# Patient Record
Sex: Male | Born: 1960
Health system: Southern US, Community
[De-identification: ages and names within clinical notes are randomized; demographics above are authoritative.]

## PROBLEM LIST (undated history)

## (undated) DIAGNOSIS — E785 Hyperlipidemia, unspecified: Secondary | ICD-10-CM

## (undated) DIAGNOSIS — H269 Unspecified cataract: Secondary | ICD-10-CM

## (undated) DIAGNOSIS — I241 Dressler's syndrome: Secondary | ICD-10-CM

## (undated) DIAGNOSIS — I309 Acute pericarditis, unspecified: Secondary | ICD-10-CM

## (undated) DIAGNOSIS — H409 Unspecified glaucoma: Secondary | ICD-10-CM

## (undated) DIAGNOSIS — Z972 Presence of dental prosthetic device (complete) (partial): Secondary | ICD-10-CM

## (undated) DIAGNOSIS — I219 Acute myocardial infarction, unspecified: Secondary | ICD-10-CM

## (undated) DIAGNOSIS — R9431 Abnormal electrocardiogram [ECG] [EKG]: Secondary | ICD-10-CM

## (undated) DIAGNOSIS — E876 Hypokalemia: Secondary | ICD-10-CM

## (undated) DIAGNOSIS — I639 Cerebral infarction, unspecified: Secondary | ICD-10-CM

## (undated) DIAGNOSIS — Z720 Tobacco use: Secondary | ICD-10-CM

## (undated) DIAGNOSIS — H47019 Ischemic optic neuropathy, unspecified eye: Secondary | ICD-10-CM

## (undated) DIAGNOSIS — A0472 Enterocolitis due to Clostridium difficile, not specified as recurrent: Secondary | ICD-10-CM

## (undated) DIAGNOSIS — R7303 Prediabetes: Secondary | ICD-10-CM

## (undated) DIAGNOSIS — J9600 Acute respiratory failure, unspecified whether with hypoxia or hypercapnia: Secondary | ICD-10-CM

## (undated) DIAGNOSIS — I253 Aneurysm of heart: Secondary | ICD-10-CM

## (undated) DIAGNOSIS — I2510 Atherosclerotic heart disease of native coronary artery without angina pectoris: Secondary | ICD-10-CM

## (undated) DIAGNOSIS — I255 Ischemic cardiomyopathy: Secondary | ICD-10-CM

## (undated) DIAGNOSIS — I3139 Other pericardial effusion (noninflammatory): Secondary | ICD-10-CM

## (undated) DIAGNOSIS — I313 Pericardial effusion (noninflammatory): Secondary | ICD-10-CM

## (undated) DIAGNOSIS — E871 Hypo-osmolality and hyponatremia: Secondary | ICD-10-CM

## (undated) DIAGNOSIS — I314 Cardiac tamponade: Secondary | ICD-10-CM

## (undated) DIAGNOSIS — R001 Bradycardia, unspecified: Secondary | ICD-10-CM

## (undated) HISTORY — PX: CORONARY ANGIOPLASTY WITH STENT PLACEMENT: SHX49

## (undated) HISTORY — DX: Bradycardia, unspecified: R00.1

## (undated) HISTORY — DX: Ischemic optic neuropathy, unspecified eye: H47.019

## (undated) HISTORY — DX: Cerebral infarction, unspecified: I63.9

## (undated) HISTORY — DX: Unspecified glaucoma: H40.9

## (undated) HISTORY — DX: Other pericardial effusion (noninflammatory): I31.39

## (undated) HISTORY — DX: Hypokalemia: E87.6

## (undated) HISTORY — DX: Pericardial effusion (noninflammatory): I31.3

## (undated) HISTORY — DX: Unspecified cataract: H26.9

---

## 2004-07-08 ENCOUNTER — Emergency Department (HOSPITAL_COMMUNITY): Admission: EM | Admit: 2004-07-08 | Discharge: 2004-07-08 | Payer: Self-pay | Admitting: Family Medicine

## 2007-04-17 DIAGNOSIS — I639 Cerebral infarction, unspecified: Secondary | ICD-10-CM

## 2007-04-17 HISTORY — DX: Cerebral infarction, unspecified: I63.9

## 2008-03-09 ENCOUNTER — Emergency Department (HOSPITAL_COMMUNITY): Admission: EM | Admit: 2008-03-09 | Discharge: 2008-03-09 | Payer: Self-pay | Admitting: Emergency Medicine

## 2008-03-10 ENCOUNTER — Ambulatory Visit (HOSPITAL_COMMUNITY): Admission: RE | Admit: 2008-03-10 | Discharge: 2008-03-10 | Payer: Self-pay | Admitting: Emergency Medicine

## 2011-01-16 LAB — POCT CARDIAC MARKERS
CKMB, poc: 2.7
Myoglobin, poc: 120

## 2011-01-16 LAB — CBC
HCT: 46.3
Hemoglobin: 15.3
MCHC: 33.1
RBC: 5.43

## 2011-01-16 LAB — POCT I-STAT, CHEM 8
BUN: 8
Chloride: 106
Glucose, Bld: 97

## 2011-01-16 LAB — DIFFERENTIAL
Basophils Absolute: 0
Basophils Relative: 1
Monocytes Relative: 10
Neutrophils Relative %: 38 — ABNORMAL LOW

## 2013-01-05 ENCOUNTER — Inpatient Hospital Stay (HOSPITAL_COMMUNITY)
Admission: EM | Admit: 2013-01-05 | Discharge: 2013-01-07 | DRG: 281 | Disposition: A | Discharge status: Home / Self Care | Payer: Medicaid Other | Attending: Cardiology | Admitting: Cardiology

## 2013-01-05 ENCOUNTER — Encounter (HOSPITAL_COMMUNITY): Payer: Self-pay | Admitting: Emergency Medicine

## 2013-01-05 ENCOUNTER — Emergency Department (HOSPITAL_COMMUNITY): Payer: Medicaid Other

## 2013-01-05 DIAGNOSIS — I252 Old myocardial infarction: Secondary | ICD-10-CM

## 2013-01-05 DIAGNOSIS — Z7982 Long term (current) use of aspirin: Secondary | ICD-10-CM

## 2013-01-05 DIAGNOSIS — F17200 Nicotine dependence, unspecified, uncomplicated: Secondary | ICD-10-CM | POA: Diagnosis present

## 2013-01-05 DIAGNOSIS — T82897A Other specified complication of cardiac prosthetic devices, implants and grafts, initial encounter: Secondary | ICD-10-CM | POA: Diagnosis present

## 2013-01-05 DIAGNOSIS — Z79899 Other long term (current) drug therapy: Secondary | ICD-10-CM

## 2013-01-05 DIAGNOSIS — Z9861 Coronary angioplasty status: Secondary | ICD-10-CM

## 2013-01-05 DIAGNOSIS — E876 Hypokalemia: Secondary | ICD-10-CM

## 2013-01-05 DIAGNOSIS — I214 Non-ST elevation (NSTEMI) myocardial infarction: Principal | ICD-10-CM | POA: Diagnosis present

## 2013-01-05 DIAGNOSIS — I2510 Atherosclerotic heart disease of native coronary artery without angina pectoris: Secondary | ICD-10-CM | POA: Diagnosis present

## 2013-01-05 DIAGNOSIS — Y831 Surgical operation with implant of artificial internal device as the cause of abnormal reaction of the patient, or of later complication, without mention of misadventure at the time of the procedure: Secondary | ICD-10-CM | POA: Diagnosis present

## 2013-01-05 HISTORY — DX: Acute myocardial infarction, unspecified: I21.9

## 2013-01-05 HISTORY — DX: Atherosclerotic heart disease of native coronary artery without angina pectoris: I25.10

## 2013-01-05 LAB — CBC
HCT: 41 % (ref 39.0–52.0)
Hemoglobin: 15 g/dL (ref 13.0–17.0)
MCH: 30.2 pg (ref 26.0–34.0)
MCHC: 36.6 g/dL — ABNORMAL HIGH (ref 30.0–36.0)
MCV: 82.7 fL (ref 78.0–100.0)
Platelets: 194 10*3/uL (ref 150–400)
RBC: 4.96 MIL/uL (ref 4.22–5.81)
RDW: 12.8 % (ref 11.5–15.5)
WBC: 8.7 10*3/uL (ref 4.0–10.5)

## 2013-01-05 LAB — RAPID URINE DRUG SCREEN, HOSP PERFORMED
Amphetamines: NOT DETECTED
Barbiturates: NOT DETECTED
Benzodiazepines: NOT DETECTED
Cocaine: NOT DETECTED
Opiates: NOT DETECTED
Tetrahydrocannabinol: NOT DETECTED

## 2013-01-05 LAB — TROPONIN I
Troponin I: 5.69 ng/mL (ref <0.30)
Troponin I: 4.32 ng/mL (ref <0.30)

## 2013-01-05 LAB — BASIC METABOLIC PANEL
BUN: 13 mg/dL (ref 6–23)
CO2: 27 mEq/L (ref 19–32)
Calcium: 9 mg/dL (ref 8.4–10.5)
Chloride: 97 mEq/L (ref 96–112)
Creatinine, Ser: 0.96 mg/dL (ref 0.50–1.35)
GFR calc Af Amer: >90 mL/min (ref >90)
GFR calc non Af Amer: >90 mL/min (ref >90)
Glucose, Bld: 104 mg/dL — ABNORMAL HIGH (ref 70–99)
Potassium: 3.5 mEq/L (ref 3.5–5.1)
Sodium: 136 mEq/L (ref 135–145)

## 2013-01-05 LAB — POCT I-STAT TROPONIN I: Troponin i, poc: 6.78 ng/mL (ref 0.00–0.08)

## 2013-01-05 LAB — PROTIME-INR
INR: 1.26 (ref 0.00–1.49)
Prothrombin Time: 15.5 seconds — ABNORMAL HIGH (ref 11.6–15.2)

## 2013-01-05 LAB — MRSA PCR SCREENING: MRSA by PCR: NEGATIVE

## 2013-01-05 LAB — PRO B NATRIURETIC PEPTIDE: Pro B Natriuretic peptide (BNP): 359.9 pg/mL — ABNORMAL HIGH (ref 0–125)

## 2013-01-05 MED ORDER — NITROGLYCERIN IN D5W 200-5 MCG/ML-% IV SOLN: 5.0000 ug/min | INTRAVENOUS | Status: DC

## 2013-01-05 MED ORDER — ASPIRIN EC 81 MG PO TBEC
81.0000 mg | DELAYED_RELEASE_TABLET | Freq: Every day | ORAL | Status: DC
  Filled 2013-01-05: qty 1

## 2013-01-05 MED ORDER — ASPIRIN 81 MG PO CHEW
324.0000 mg | CHEWABLE_TABLET | ORAL | Status: AC
  Administered 2013-01-06: 324 mg via ORAL
  Filled 2013-01-05: qty 4

## 2013-01-05 MED ORDER — ASPIRIN EC 81 MG PO TBEC
81.0000 mg | DELAYED_RELEASE_TABLET | Freq: Every day | ORAL | Status: DC
  Administered 2013-01-07: 81 mg via ORAL
  Filled 2013-01-05 (×2): qty 1

## 2013-01-05 MED ORDER — SODIUM CHLORIDE 0.9 % IV SOLN
250.0000 mL | INTRAVENOUS | Status: DC | PRN
  Administered 2013-01-06: 250 mL via INTRAVENOUS

## 2013-01-05 MED ORDER — SODIUM CHLORIDE 0.9 % IJ SOLN: 3.0000 mL | INTRAMUSCULAR | Status: DC | PRN

## 2013-01-05 MED ORDER — ATORVASTATIN CALCIUM 80 MG PO TABS
80.0000 mg | ORAL_TABLET | Freq: Every day | ORAL | Status: DC
  Administered 2013-01-05 – 2013-01-06 (×2): 80 mg via ORAL
  Filled 2013-01-05 (×4): qty 1

## 2013-01-05 MED ORDER — ACETAMINOPHEN 325 MG PO TABS: 650.0000 mg | ORAL_TABLET | ORAL | Status: DC | PRN

## 2013-01-05 MED ORDER — HEPARIN (PORCINE) IN NACL 100-0.45 UNIT/ML-% IJ SOLN
1400.0000 [IU]/h | INTRAMUSCULAR | Status: DC
  Administered 2013-01-05: 1400 [IU]/h via INTRAVENOUS
  Filled 2013-01-05: qty 250

## 2013-01-05 MED ORDER — SODIUM CHLORIDE 0.9 % IV SOLN
1.0000 mL/kg/h | INTRAVENOUS | Status: DC
  Administered 2013-01-06: 1 mL/kg/h via INTRAVENOUS

## 2013-01-05 MED ORDER — ASPIRIN 81 MG PO CHEW
324.0000 mg | CHEWABLE_TABLET | Freq: Once | ORAL | Status: AC
  Administered 2013-01-05: 324 mg via ORAL
  Filled 2013-01-05: qty 4

## 2013-01-05 MED ORDER — ONDANSETRON HCL 4 MG/2ML IJ SOLN: 4.0000 mg | Freq: Four times a day (QID) | INTRAMUSCULAR | Status: DC | PRN

## 2013-01-05 MED ORDER — METOPROLOL TARTRATE 12.5 MG HALF TABLET
12.5000 mg | ORAL_TABLET | Freq: Two times a day (BID) | ORAL | Status: DC
  Administered 2013-01-05: 12.5 mg via ORAL
  Filled 2013-01-05 (×3): qty 1

## 2013-01-05 MED ORDER — NITROGLYCERIN 0.4 MG SL SUBL
0.4000 mg | SUBLINGUAL_TABLET | SUBLINGUAL | Status: DC | PRN
  Administered 2013-01-05 (×2): 0.4 mg via SUBLINGUAL

## 2013-01-05 MED ORDER — SODIUM CHLORIDE 0.9 % IJ SOLN
3.0000 mL | Freq: Two times a day (BID) | INTRAMUSCULAR | Status: DC
  Administered 2013-01-06: 3 mL via INTRAVENOUS

## 2013-01-05 MED ORDER — HEPARIN BOLUS VIA INFUSION
4000.0000 [IU] | Freq: Once | INTRAVENOUS | Status: AC
  Administered 2013-01-05: 4000 [IU] via INTRAVENOUS
  Filled 2013-01-05: qty 4000

## 2013-01-05 MED ORDER — POTASSIUM CHLORIDE CRYS ER 20 MEQ PO TBCR
20.0000 meq | EXTENDED_RELEASE_TABLET | Freq: Every day | ORAL | Status: DC
  Administered 2013-01-05 – 2013-01-07 (×2): 20 meq via ORAL
  Filled 2013-01-05 (×3): qty 1

## 2013-01-05 NOTE — Consult Note (Signed)
ANTICOAGULATION CONSULT NOTE - Initial Consult  Pharmacy Consult for Heparin Indication: chest pain/ACS  No Known Allergies  Patient Measurements: Height: 6\' 1"  (185.4 cm) Weight: 253 lb (114.76 kg) IBW/kg (Calculated) : 79.9 Heparin Dosing Weight: 104kg  Vital Signs: Temp: 98.9 F (37.2 C) (09/22 1823) Temp src: Oral (09/22 1823) BP: 102/71 mmHg (09/22 1823) Pulse Rate: 90 (09/22 1823)  Labs:  Recent Labs  01/05/13 1847  HGB 15.0  HCT 41.0  PLT 194    Estimated Creatinine Clearance: 96.7 ml/min (by C-G formula based on Cr of 1.2).   Medical History: Past Medical History  Diagnosis Date  . Coronary artery disease     Medications:  No anticoagulants pta  Assessment: 52yom with history of CAD presents to the ED with chest pain. Initial troponin is 6.78. He will begin IV heparin. Baseline CBC is ok, BMET pending.  Goal of Therapy:  Heparin level 0.3-0.7 units/ml Monitor platelets by anticoagulation protocol: Yes   Plan:  1) Heparin bolus 4000 units x 1 2) Heparin drip at 1400 units/hr 3) 6 hour heparin level 4) Daily heparin level and CBC  Fredrik Rigger 01/05/2013,7:18 PM

## 2013-01-05 NOTE — H&P (Signed)
[name] is an 52 y.o. male.   PCP: None Primary cardiologist: None Chief Complaint: Chest pain x4 days HPI:  This pleasant 52 year old gentleman comes to the emergency room because of ongoing chest pain.  The patient has a history of known ischemic heart disease.  Approximately 4 or 5 years ago while in [place] the patient had chest pain and a myocardial infarction and was treated with 2 stents.  Since that time he has been doing well.  He has been on no medication except for a 325 mg aspirin daily.  He was in his usual state of health on Friday, December 19 when he began having substernal chest discomfort.  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  The patient was in [place] when the chest pain began.  The chest pain has persisted over the past 4 days.  After he arrived in [place] he flew back home and then came to the emergency room.  In the emergency room his EKG is nonacute but did show evidence of the old inferoposterior wall myocardial infarction and his troponin level is significantly elevated at 6.78.  The patient has had nausea and vomiting once on September 20, none since.  He denies any history of peptic ulcer or GI bleed.  The patient does not consume alcohol.  He does not use illicit drugs.  He does smoke one pack of cigarettes a day.  His cholesterol status is unknown.   Past Medical History  Diagnosis Date  . Coronary artery disease     Past Surgical History  Procedure Laterality Date  . Coronary stent placement      No family history on file. Social History:  reports that he has been smoking.  He does not have any smokeless tobacco history on file. He reports that he does not drink alcohol or use illicit drugs.  Allergies: No Known Allergies   (Not in a hospital admission)  Results for orders placed during the hospital encounter of 01/05/13 (from the past 48 hour(s))  CBC      Status: Abnormal   Collection Time    01/05/13  6:47 PM      Result Value Range   WBC 8.7  4.0 - 10.5 K/uL   RBC 4.96  4.22 - 5.81 MIL/uL   Hemoglobin 15.0  13.0 - 17.0 g/dL   HCT 81.1  91.4 - 78.2 %   MCV 82.7  78.0 - 100.0 fL   MCH 30.2  26.0 - 34.0 pg   MCHC 36.6 (*) 30.0 - 36.0 g/dL   RDW 95.6  21.3 - 08.6 %   Platelets 194  150 - 400 K/uL  BASIC METABOLIC PANEL     Status: Abnormal   Collection Time    01/05/13  6:47 PM      Result Value Range   Sodium 136  135 - 145 mEq/L   Potassium 3.5  3.5 - 5.1 mEq/L   Chloride 97  96 - 112 mEq/L   CO2 27  19 - 32 mEq/L   Glucose, Bld 104 (*) 70 - 99 mg/dL   BUN 13  6 - 23 mg/dL   Creatinine, Ser 5.78  0.50 - 1.35 mg/dL   Calcium 9.0  8.4 - 46.9 mg/dL   GFR calc non Af Amer >90  >90 mL/min   GFR calc Af Amer >90  >90 mL/min   Comment: (NOTE)  The eGFR has been calculated using the CKD EPI equation.     This calculation has not been validated in all clinical situations.     eGFR's persistently <90 mL/min signify possible Chronic Kidney     Disease.  PRO B NATRIURETIC PEPTIDE     Status: Abnormal   Collection Time    01/05/13  6:47 PM      Result Value Range   Pro B Natriuretic peptide (BNP) 359.9 (*) 0 - 125 pg/mL  POCT I-STAT TROPONIN I     Status: Abnormal   Collection Time    01/05/13  6:56 PM      Result Value Range   Troponin i, poc 6.78 (*) 0.00 - 0.08 ng/mL   Comment NOTIFIED PHYSICIAN     Comment 3            Comment: Due to the release kinetics of cTnI,     a negative result within the first hours     of the onset of symptoms does not rule out     myocardial infarction with certainty.     If myocardial infarction is still suspected,     repeat the test at appropriate intervals.   No results found.  Review of systems reveals no history of hematochezia or melena.  All other systems negative except as noted in present illness.  Blood pressure 111/71, pulse 90, temperature 98.9 F (37.2 C), temperature source  Oral, resp. rate 19, height 6\' 1"  (1.854 m), weight 253 lb (114.76 kg), SpO2 100.00%. The patient appears to be in no distress.  Head and neck exam reveals that the pupils are equal and reactive.  The extraocular movements are full.  There is no scleral icterus.  Mouth and pharynx are benign.  No lymphadenopathy.  No carotid bruits.  The jugular venous pressure is normal.  Thyroid is not enlarged or tender.  Chest is clear to percussion and auscultation.  No rales or rhonchi.  Expansion of the chest is symmetrical.  Heart reveals no abnormal lift or heave.  First and second heart sounds are normal.  There is no murmur gallop rub or click.  The abdomen is soft and nontender.  Bowel sounds are normoactive.  There is no hepatosplenomegaly or mass.  There are no abdominal bruits.  Extremities reveal no phlebitis or edema.  Pedal pulses are good.  There is no cyanosis or clubbing.  Neurologic exam is normal strength and no lateralizing weakness.  No sensory deficits.  Integument reveals no rash  EKG on 01/03/13 1822 in sinus rhythm with Q waves in the inferior leads there are plan thin R waves in V1 system with true posterior and no significant ST-T wave changes.  There is slight coving of the ST segments in movements 3 and aVF consistent with prior myocardial infarction.  Assessment/Plan 1. non-STEMI 2. ischemic heart disease status post prior stents 4 or 5 years ago in [place].  Details of that procedure not currently available. 3. tobacco abuse, ongoing. 4. borderline hypokalemia  Plan: Admit to step down unit.  Continue aspirin, begin IV heparin and IV nitroglycerin.  Add  beta blocker and statin.  Add Kdur. We will plan for cardiac catheterization and possible angioplasty for tomorrow morning.  Serial cardiac enzymes and EKGs. Not clinically in congestive heart failure although BNP is mildly elevated.  Chest x-ray is pending.  Cassell Clement 01/05/2013, 8:09 PM

## 2013-01-05 NOTE — ED Notes (Signed)
Pt. reports left chest pain with nausea and vomitting , SOB and occasional dry cough onset last Friday , pt. stated history of CAD with coronary stent his cardiologist is at [place].

## 2013-01-06 ENCOUNTER — Encounter (HOSPITAL_COMMUNITY)
Admission: EM | Disposition: A | Discharge status: Home / Self Care | Payer: 59 | Source: Home / Self Care | Attending: Cardiology

## 2013-01-06 ENCOUNTER — Encounter (HOSPITAL_COMMUNITY): Payer: Self-pay | Admitting: *Deleted

## 2013-01-06 DIAGNOSIS — I214 Non-ST elevation (NSTEMI) myocardial infarction: Secondary | ICD-10-CM

## 2013-01-06 DIAGNOSIS — I2510 Atherosclerotic heart disease of native coronary artery without angina pectoris: Secondary | ICD-10-CM

## 2013-01-06 DIAGNOSIS — I517 Cardiomegaly: Secondary | ICD-10-CM

## 2013-01-06 HISTORY — PX: CARDIAC CATHETERIZATION: SHX172

## 2013-01-06 HISTORY — PX: LEFT HEART CATHETERIZATION WITH CORONARY ANGIOGRAM: SHX5451

## 2013-01-06 LAB — CBC
MCH: 29.5 pg (ref 26.0–34.0)
MCV: 82.6 fL (ref 78.0–100.0)
Platelets: 195 10*3/uL (ref 150–400)
RBC: 4.38 MIL/uL (ref 4.22–5.81)
RDW: 12.8 % (ref 11.5–15.5)
WBC: 7.3 10*3/uL (ref 4.0–10.5)

## 2013-01-06 LAB — HEPARIN LEVEL (UNFRACTIONATED)
Heparin Unfractionated: 1.09 IU/mL — ABNORMAL HIGH (ref 0.30–0.70)
Heparin Unfractionated: 0.2 IU/mL — ABNORMAL LOW (ref 0.30–0.70)

## 2013-01-06 LAB — COMPREHENSIVE METABOLIC PANEL
ALT: 23 U/L (ref 0–53)
Alkaline Phosphatase: 83 U/L (ref 39–117)
BUN: 13 mg/dL (ref 6–23)
CO2: 25 mEq/L (ref 19–32)
GFR calc Af Amer: >90 mL/min (ref >90)
GFR calc non Af Amer: >90 mL/min (ref >90)
Glucose, Bld: 95 mg/dL (ref 70–99)
Potassium: 3.4 mEq/L — ABNORMAL LOW (ref 3.5–5.1)
Sodium: 140 mEq/L (ref 135–145)
Total Bilirubin: 0.5 mg/dL (ref 0.3–1.2)
Total Protein: 6.7 g/dL (ref 6.0–8.3)

## 2013-01-06 LAB — LIPID PANEL
Cholesterol: 164 mg/dL (ref 0–200)
HDL: 44 mg/dL (ref >39)
LDL Cholesterol: 98 mg/dL (ref 0–99)
Total CHOL/HDL Ratio: 3.7 RATIO
Triglycerides: 109 mg/dL (ref <150)
VLDL: 22 mg/dL (ref 0–40)

## 2013-01-06 LAB — TSH: TSH: 0.604 u[IU]/mL (ref 0.350–4.500)

## 2013-01-06 LAB — TROPONIN I: Troponin I: 4.17 ng/mL (ref <0.30)

## 2013-01-06 SURGERY — LEFT HEART CATHETERIZATION WITH CORONARY ANGIOGRAM
Anesthesia: LOCAL

## 2013-01-06 MED ORDER — ADENOSINE 12 MG/4ML IV SOLN
16.0000 mL | Freq: Once | INTRAVENOUS | Status: DC
  Filled 2013-01-06: qty 16

## 2013-01-06 MED ORDER — LIDOCAINE HCL (PF) 1 % IJ SOLN
INTRAMUSCULAR | Status: AC
  Filled 2013-01-06: qty 30

## 2013-01-06 MED ORDER — HEPARIN SODIUM (PORCINE) 1000 UNIT/ML IJ SOLN
INTRAMUSCULAR | Status: AC
  Filled 2013-01-06: qty 1

## 2013-01-06 MED ORDER — MIDAZOLAM HCL 2 MG/2ML IJ SOLN
INTRAMUSCULAR | Status: AC
  Filled 2013-01-06: qty 2

## 2013-01-06 MED ORDER — CLOPIDOGREL BISULFATE 75 MG PO TABS
75.0000 mg | ORAL_TABLET | Freq: Every day | ORAL | Status: DC
  Administered 2013-01-07: 75 mg via ORAL
  Filled 2013-01-06: qty 1

## 2013-01-06 MED ORDER — FENTANYL CITRATE 0.05 MG/ML IJ SOLN
INTRAMUSCULAR | Status: AC
  Filled 2013-01-06: qty 2

## 2013-01-06 MED ORDER — NITROGLYCERIN 0.2 MG/ML ON CALL CATH LAB
INTRAVENOUS | Status: AC
  Filled 2013-01-06: qty 1

## 2013-01-06 MED ORDER — HEPARIN SODIUM (PORCINE) 5000 UNIT/ML IJ SOLN
5000.0000 [IU] | Freq: Three times a day (TID) | INTRAMUSCULAR | Status: DC
  Administered 2013-01-06 – 2013-01-07 (×2): 5000 [IU] via SUBCUTANEOUS
  Filled 2013-01-06 (×6): qty 1

## 2013-01-06 MED ORDER — VERAPAMIL HCL 2.5 MG/ML IV SOLN
INTRAVENOUS | Status: AC
  Filled 2013-01-06: qty 2

## 2013-01-06 MED ORDER — HEPARIN (PORCINE) IN NACL 2-0.9 UNIT/ML-% IJ SOLN
INTRAMUSCULAR | Status: AC
  Filled 2013-01-06: qty 1500

## 2013-01-06 MED ORDER — METOPROLOL TARTRATE 25 MG PO TABS
25.0000 mg | ORAL_TABLET | Freq: Two times a day (BID) | ORAL | Status: DC
  Administered 2013-01-06 – 2013-01-07 (×2): 25 mg via ORAL
  Filled 2013-01-06 (×3): qty 1

## 2013-01-06 MED ORDER — POTASSIUM CHLORIDE CRYS ER 20 MEQ PO TBCR: 20.0000 meq | EXTENDED_RELEASE_TABLET | Freq: Once | ORAL | Status: DC

## 2013-01-06 MED ORDER — SODIUM CHLORIDE 0.9 % IV SOLN: 1.0000 mL/kg/h | INTRAVENOUS | Status: AC

## 2013-01-06 NOTE — Interval H&P Note (Signed)
History and Physical Interval Note:  01/06/2013 7:51 AM  [name]  has presented today for surgery, with the diagnosis of cp  The various methods of treatment have been discussed with the patient and family. After consideration of risks, benefits and other options for treatment, the patient has consented to  Procedure(s): LEFT HEART CATHETERIZATION WITH CORONARY ANGIOGRAM (N/A) as a surgical intervention .  The patient's history has been reviewed, patient examined, no change in status, stable for surgery.  I have reviewed the patient's chart and labs.  Questions were answered to the patient's satisfaction.   Cath Lab Visit (complete for each Cath Lab visit)  Clinical Evaluation Leading to the Procedure:   ACS: yes  Non-ACS:    Anginal Classification: CCS III  Anti-ischemic medical therapy: No Therapy  Non-Invasive Test Results: No non-invasive testing performed  Prior CABG: No previous CABG        [name] 01/06/2013 7:51 AM

## 2013-01-06 NOTE — CV Procedure (Signed)
   Cardiac Catheterization Procedure Note  Name: [name] MRN: [id number] DOB: 09/29/1960  Procedure: Left Heart Cath, Selective Coronary Angiography, LV angiography, FFR analysis of the left circumflex artery.  Indication: 52 yo with history of coronary disease status post stenting of the proximal LAD and proximal left circumflex arteries 5 years ago in [place]. He presents now with a non-ST elevation myocardial infarction. He has a history of tobacco abuse.   Procedural Details: The right wrist was prepped, draped, and anesthetized with 1% lidocaine. Using the modified Seldinger technique, a 5 French sheath was introduced into the right radial artery. 3 mg of verapamil was administered through the sheath, weight-based unfractionated heparin was administered intravenously. Standard Judkins catheters were used for selective coronary angiography and left ventriculography. Catheter exchanges were performed over an exchange length guidewire. There were no immediate procedural complications. A TR band was used for radial hemostasis at the completion of the procedure.  The patient was transferred to the post catheterization recovery area for further monitoring.  Procedural Findings: Hemodynamics: AO 98/61 with a mean of 78 mmHg LV 98/18 mmHg  Coronary angiography: Coronary dominance: right  Left mainstem: Normal.  Left anterior descending (LAD): The LAD is a large vessel extending to the apex. There is a stent noted in the proximal vessel that is widely patent. There is no significant disease in the LAD. The first diagonal is moderate in size and appears normal. The second diagonal is very small and normal.  Left circumflex (LCx): The left circumflex gives rise to 2 marginal branches. In the proximal vessel there is a stented segment. There is diffuse in-stent disease up to 50-70%.  Right coronary artery (RCA): The RCA is a large dominant vessel. It is occluded in the mid vessel. There  are left to right collaterals to the distal RCA branches.  Left ventriculography: Left ventricular systolic function is abnormal, LVEF is estimated at 40-45%, there is severe hypokinesis to akinesis of the basal to mid inferior wall, there is no significant mitral regurgitation.  Given the uncertainty of the significance of the left circumflex disease we proceeded with flow wire analysis of this vessel. The patient was heparinized. Once a therapeutic ACT was obtained a 6 [place] XB LAD 3.5 guide was used. A pro-water guidewire was placed down the left circumflex. Using a flow wire we performed pressure flow wire analysis under maximal hyperemia with IV adenosine. This revealed a fractional flow reserve of 0.84.   Final Conclusions:   1. Single vessel occlusive coronary disease. The right coronary is occluded in the mid vessel with left to right collaterals. This appears to be his culprit vessel. 2. Patent stent in the proximal LAD. 3. Stent in the proximal left circumflex with moderate diffuse disease. Fractional flow reserve of 0.84 suggests that this is not hemodynamically significant. 4. Moderate left ventricular dysfunction.  Recommendations: Recommend medical management. Given his recent infarct I would recommend the addition of Plavix. We'll titrate up beta blocker as tolerated. If  blood pressure will tolerate,consider addition of an ACE inhibitor and nitrate. If patient continues to have refractory angina despite optimal medical therapy PCI of the RCA could be considered.  Theron Arista [facility] 01/06/2013, 8:59 AM

## 2013-01-06 NOTE — Progress Notes (Signed)
Subjective:  Patient doing well post-cath. Did not require PCI. Has occlusion of his mid-RCA with left-to-right collaterals. Medical therapy. Patient is not having any further chest pain. BP remains soft so cannot add any ACEi yet. Plavix has been added to his regimen. Telemetry stable NSR.   Objective:  Vital Signs in the last 24 hours: Temp:  [98.3 F (36.8 C)-99.5 F (37.5 C)] 98.3 F (36.8 C) (09/23 1130) Pulse Rate:  [71-95] 75 (09/23 1130) Resp:  [14-34] 18 (09/23 1130) BP: (93-132)/(64-83) 99/75 mmHg (09/23 1130) SpO2:  [92 %-100 %] 94 % (09/23 1130) Weight:  [234 lb 5.6 oz (106.3 kg)-253 lb (114.76 kg)] 234 lb 5.6 oz (106.3 kg) (09/23 0500)  Intake/Output from previous day: 09/22 0701 - 09/23 0700 In: 152.3 [I.V.:152.3] Out: 300 [Urine:300] Intake/Output from this shift:    . [START ON 01/07/2013] aspirin EC  81 mg Oral Daily  . atorvastatin  80 mg Oral q1800  . [START ON 01/07/2013] clopidogrel  75 mg Oral Q breakfast  . heparin  5,000 Units Subcutaneous Q8H  . metoprolol tartrate  25 mg Oral BID  . potassium chloride  20 mEq Oral Daily  . sodium chloride  3 mL Intravenous Q12H   . sodium chloride      Physical Exam: The patient appears to be in no distress.  Head and neck exam reveals that the pupils are equal and reactive.  The extraocular movements are full.  There is no scleral icterus.  Mouth and pharynx are benign.  No lymphadenopathy.  No carotid bruits.  The jugular venous pressure is normal.  Thyroid is not enlarged or tender.  Chest is clear to percussion and auscultation.  No rales or rhonchi.  Expansion of the chest is symmetrical.  Heart reveals no abnormal lift or heave.  First and second heart sounds are normal.  There is no murmur gallop rub or click.  The abdomen is soft and nontender.  Bowel sounds are normoactive.  There is no hepatosplenomegaly or mass.  There are no abdominal bruits.  Extremities reveal no phlebitis or edema.  Pedal pulses  are good.  There is no cyanosis or clubbing.  Neurologic exam is normal strength and no lateralizing weakness.  No sensory deficits.  Integument reveals no rash  Lab Results:  Recent Labs  01/05/13 1847 01/06/13 0511  WBC 8.7 7.3  HGB 15.0 12.9*  PLT 194 195    Recent Labs  01/05/13 1847 01/06/13 0511  NA 136 140  K 3.5 3.4*  CL 97 105  CO2 27 25  GLUCOSE 104* 95  BUN 13 13  CREATININE 0.96 0.86    Recent Labs  01/06/13 0511 01/06/13 1120  TROPONINI 4.17* 2.76*   Hepatic Function Panel  Recent Labs  01/06/13 0511  PROT 6.7  ALBUMIN 3.1*  AST 34  ALT 23  ALKPHOS 83  BILITOT 0.5    Recent Labs  01/06/13 0511  CHOL 164   No results found for this basename: PROTIME,  in the last 72 hours  Imaging: Dg Chest Port 1 View  01/05/2013   CLINICAL DATA:  Chest pain. History of cardiac disease.  EXAM: PORTABLE CHEST - 1 VIEW  COMPARISON:  03/09/2008  FINDINGS: The heart size and mediastinal contours are within normal limits. Both lungs are clear. The visualized skeletal structures are unremarkable.  IMPRESSION: No active disease.   Electronically Signed   By: Amie Portland   On: 01/05/2013 21:02    Cardiac Studies: 2D  echo pending. Assessment/Plan:  1. Single vessel occlusive coronary disease. The right coronary is occluded in the mid vessel with left to right collaterals. This appears to be his culprit vessel.  2. Patent stent in the proximal LAD.  3. Stent in the proximal left circumflex with moderate diffuse disease. Fractional flow reserve of 0.84 suggests that this is not hemodynamically significant.  4. Moderate left ventricular dysfunction. 5. NSTEMI  troponins trending down. EKG stable. 6. Hypokalemia. 7. Tobacco abuse.  Plan: Possible home Wednesday with close outpatient followup.  LOS: 1 day    Cassell Clement 01/06/2013, 1:19 PM

## 2013-01-06 NOTE — ED Provider Notes (Signed)
CSN: [id number]     Arrival date & time 01/05/13  1812 History   First MD Initiated Contact with Patient 01/05/13 1913     Chief Complaint  Patient presents with  . Chest Pain   (Consider location/radiation/quality/duration/timing/severity/associated sxs/prior Treatment) HPI  52 year old male with chest pain. Onset Friday. Initially patient had nausea and did vomit. This resulted. Pain has not. His pressure in the left side of his chest which radiates to his left upper extremity. Says he was some mild shortness of breath. No fevers or chills. No cough. Patient is a long [employer] engineer. He is out of town which is why he finally presented today. See past history of known coronary disease status post stenting. This is done in [place] when he was out of town for his job several years ago. He has not had consistent cardiology followup since this event. No meds routinely aside from ASA. Smoker. Denies drug use.   Past Medical History  Diagnosis Date  . Coronary artery disease    Past Surgical History  Procedure Laterality Date  . Coronary stent placement     No family history on file. History  Substance Use Topics  . Smoking status: Current Every Day Smoker  . Smokeless tobacco: Not on file  . Alcohol Use: No    Review of Systems  All systems reviewed and negative, other than as noted in HPI.   Allergies  Review of patient's allergies indicates no known allergies.  Home Medications  No current outpatient prescriptions on file. BP 98/72  Pulse 83  Temp(Src) 98.8 F (37.1 C) (Oral)  Resp 32  Ht 6\' 1"  (1.854 m)  Wt 234 lb 5.6 oz (106.3 kg)  BMI 30.93 kg/m2  SpO2 96% Physical Exam  Nursing note and vitals reviewed. Constitutional: He appears well-developed and well-nourished. No distress.  HENT:  Head: Normocephalic and atraumatic.  Eyes: Conjunctivae are normal. Right eye exhibits no discharge. Left eye exhibits no discharge.  Neck: Neck supple.  Cardiovascular: Normal  rate, regular rhythm and normal heart sounds.  Exam reveals no gallop and no friction rub.   No murmur heard. Pulmonary/Chest: Effort normal and breath sounds normal. No respiratory distress. He exhibits no tenderness.  Abdominal: Soft. He exhibits no distension. There is no tenderness.  Musculoskeletal: He exhibits no edema and no tenderness.  Lower extremities symmetric as compared to each other. No calf tenderness. Negative Homan's. No palpable cords.   Neurological: He is alert.  Skin: Skin is warm and dry.  Psychiatric: He has a normal mood and affect. His behavior is normal. Thought content normal.    ED Course  Procedures (including critical care time)  CRITICAL CARE Performed by: Raeford Razor  Total critical care time: 30 minutes  Critical care time was exclusive of separately billable procedures and treating other patients. Critical care was necessary to treat or prevent imminent or life-threatening deterioration. Critical care was time spent personally by me on the following activities: development of treatment plan with patient and/or surrogate as well as nursing, discussions with consultants, evaluation of patient's response to treatment, examination of patient, obtaining history from patient or surrogate, ordering and performing treatments and interventions, ordering and review of laboratory studies, ordering and review of radiographic studies, pulse oximetry and re-evaluation of patient's condition.   Labs Review Labs Reviewed  CBC - Abnormal; Notable for the following:    MCHC 36.6 (*)    All other components within normal limits  BASIC METABOLIC PANEL - Abnormal; Notable for  the following:    Glucose, Bld 104 (*)    All other components within normal limits  PRO B NATRIURETIC PEPTIDE - Abnormal; Notable for the following:    Pro B Natriuretic peptide (BNP) 359.9 (*)    All other components within normal limits  TROPONIN I - Abnormal; Notable for the following:     Troponin I 5.69 (*)    All other components within normal limits  TROPONIN I - Abnormal; Notable for the following:    Troponin I 4.32 (*)    All other components within normal limits  PROTIME-INR - Abnormal; Notable for the following:    Prothrombin Time 15.5 (*)    All other components within normal limits  POCT I-STAT TROPONIN I - Abnormal; Notable for the following:    Troponin i, poc 6.78 (*)    All other components within normal limits  MRSA PCR SCREENING  URINE RAPID DRUG SCREEN (HOSP PERFORMED)  HEPARIN LEVEL (UNFRACTIONATED)  CBC  TROPONIN I  TROPONIN I  TSH  HEMOGLOBIN A1C  COMPREHENSIVE METABOLIC PANEL  LIPID PANEL   Imaging Review Dg Chest Port 1 View  01/05/2013   CLINICAL DATA:  Chest pain. History of cardiac disease.  EXAM: PORTABLE CHEST - 1 VIEW  COMPARISON:  03/09/2008  FINDINGS: The heart size and mediastinal contours are within normal limits. Both lungs are clear. The visualized skeletal structures are unremarkable.  IMPRESSION: No active disease.   Electronically Signed   By: Amie Portland   On: 01/05/2013 21:02   EKG:  Rhythm: normal sinus Vent. rate 97 BPM PR interval 158 ms QRS duration 98 ms QT/QTc 394/500 ms Inferior Q waves ST segments: Nonspecific ST changes Comparison: None   MDM   1. Non-STEMI (non-ST elevated myocardial infarction)   2. Hypokalemia     52 year old male with chest pain. EKG is nondiagnostic. He has a significant leg elevated troponin though. Patient was started on heparin and nitroglycerin drips. He received aspirin. Cardiology was consulted.    Raeford Razor, MD 01/08/13 [phone]

## 2013-01-06 NOTE — Progress Notes (Signed)
Utilization Review Completed.[name] T9/23/2014  

## 2013-01-06 NOTE — Progress Notes (Signed)
  Echocardiogram 2D Echocardiogram has been performed.  [name] 01/06/2013, 6:06 PM

## 2013-01-07 ENCOUNTER — Telehealth: Payer: Self-pay | Admitting: *Deleted

## 2013-01-07 DIAGNOSIS — E876 Hypokalemia: Secondary | ICD-10-CM | POA: Diagnosis present

## 2013-01-07 LAB — BASIC METABOLIC PANEL
BUN: 10 mg/dL (ref 6–23)
CO2: 26 mEq/L (ref 19–32)
Chloride: 101 mEq/L (ref 96–112)
GFR calc Af Amer: >90 mL/min (ref >90)
Potassium: 3.6 mEq/L (ref 3.5–5.1)
Sodium: 136 mEq/L (ref 135–145)

## 2013-01-07 LAB — CBC
HCT: 36.9 % — ABNORMAL LOW (ref 39.0–52.0)
Hemoglobin: 13 g/dL (ref 13.0–17.0)
MCV: 82.9 fL (ref 78.0–100.0)
RBC: 4.45 MIL/uL (ref 4.22–5.81)
RDW: 12.7 % (ref 11.5–15.5)
WBC: 6.4 10*3/uL (ref 4.0–10.5)

## 2013-01-07 MED ORDER — ATORVASTATIN CALCIUM 80 MG PO TABS: 80.0000 mg | ORAL_TABLET | Freq: Every day | ORAL | Status: DC

## 2013-01-07 MED ORDER — ASPIRIN 81 MG PO TBEC: 81.0000 mg | DELAYED_RELEASE_TABLET | Freq: Every day | ORAL | Status: DC

## 2013-01-07 MED ORDER — NITROGLYCERIN 0.4 MG SL SUBL: 0.4000 mg | SUBLINGUAL_TABLET | SUBLINGUAL | Status: DC | PRN

## 2013-01-07 MED ORDER — LISINOPRIL 5 MG PO TABS
5.0000 mg | ORAL_TABLET | Freq: Every day | ORAL | Status: DC
  Administered 2013-01-07: 5 mg via ORAL
  Filled 2013-01-07 (×2): qty 1

## 2013-01-07 MED ORDER — SIMVASTATIN 40 MG PO TABS: 40.0000 mg | ORAL_TABLET | Freq: Every day | ORAL | Status: DC

## 2013-01-07 MED ORDER — METOPROLOL TARTRATE 25 MG PO TABS: 25.0000 mg | ORAL_TABLET | Freq: Two times a day (BID) | ORAL | Status: DC

## 2013-01-07 MED ORDER — LISINOPRIL 5 MG PO TABS: 5.0000 mg | ORAL_TABLET | Freq: Every day | ORAL | Status: DC

## 2013-01-07 MED ORDER — CLOPIDOGREL BISULFATE 75 MG PO TABS: 75.0000 mg | ORAL_TABLET | Freq: Every day | ORAL | Status: DC

## 2013-01-07 NOTE — Telephone Encounter (Signed)
Left message to call back  

## 2013-01-07 NOTE — Progress Notes (Signed)
CARDIAC REHAB PHASE I   PRE:  Rate/Rhythm: 91SR  BP:  Supine:   Sitting: 113/63  Standing:    SaO2:   MODE:  Ambulation: 550 ft   POST:  Rate/Rhythm: 95SR  BP:  Supine:   Sitting: 131/61  Standing:    SaO2:  0920-1035 Pt walked 550 ft with steady gait and no CP. Tolerated well. Education completed with pt voicing understanding. Discussed smoking cessation and gave hand outs. Encouraged pt to call [phone] as needed. Pt stated he quit for 2 and a half years by cutting down slowly. Stated patches did not work and this worked for him. Knows the importance of quitting. Discussed CRP 2 and pt very interested in referral. Gave pt financial form and encouraged him to make appt to drop by form with counsellor. Will send referral to [place].   Luetta Nutting, RN BSN  01/07/2013 10:32 AM

## 2013-01-07 NOTE — Discharge Summary (Signed)
CARDIOLOGY DISCHARGE SUMMARY   Patient ID: [name] MRN: [id number] DOB/AGE: 05-11-1960 52 y.o.  Admit date: 01/05/2013 Discharge date: 01/07/2013  Primary Discharge Diagnosis:     Non-STEMI (non-ST elevated myocardial infarction) - medical therapy for CAD  Secondary Discharge Diagnosis:    Hypokalemia   Tobacco abuse  Procedures:  Left Heart Cath, Selective Coronary Angiography, LV angiography, FFR analysis of the left circumflex artery   Hospital Course: [name] is a 52 y.o. male with a history of CAD. He had an MI in [place] for 5 years ago treated with stent. He had prolonged chest pain he came to the hospital where his troponin was significantly elevated. He was admitted for further evaluation and treatment.  His cardiac enzymes remained elevated indicating a non-ST segment elevation MI. He was taken to the cath lab on 01/06/2013, results are below. Medical therapy for coronary artery disease was indicated. He is on aspirin, a statin and beta blocker.  On 01/06/2013, Dr. Patty Sermons evaluated [name]. His blood pressure was too low to add an ACE inhibitor. Plavix was added to his medication regimen. He was maintaining sinus rhythm. His potassium was slightly low and was supplemented. It normalized. A 2-D echocardiogram was performed which showed some systolic and diastolic dysfunction. His chest x-ray was reviewed and showed no acute disease, no heart failure. A lipid profile was reviewed and he is to remain on a statin.  On 01/07/2013, Dr. Patty Sermons reassessed [name]. His blood pressure had improved and Dr. Patty Sermons recommended adding lisinopril 5 mg daily. He was tolerating his other medications. [name] was referred to cardiac rehabilitation. Smoking cessation was discussed and encouraged.  Dr. Patty Sermons reviewed all data and felt no further inpatient workup was indicated. [name] is considered stable for discharge, to follow up as an  outpatient.  Labs:  Lab Results  Component Value Date   WBC 6.4 01/07/2013   HGB 13.0 01/07/2013   HCT 36.9* 01/07/2013   MCV 82.9 01/07/2013   PLT 202 01/07/2013     Recent Labs Lab 01/06/13 0511 01/07/13 0545  NA 140 136  K 3.4* 3.6  CL 105 101  CO2 25 26  BUN 13 10  CREATININE 0.86 0.88  CALCIUM 8.2* 8.7  PROT 6.7  --   BILITOT 0.5  --   ALKPHOS 83  --   ALT 23  --   AST 34  --   GLUCOSE 95 88    Recent Labs  01/06/13 0511 01/06/13 1120 01/07/13 0545  TROPONINI 4.17* 2.76* 3.97*   Lipid Panel     Component Value Date/Time   CHOL 164 01/06/2013 0511   TRIG 109 01/06/2013 0511   HDL 44 01/06/2013 0511   CHOLHDL 3.7 01/06/2013 0511   VLDL 22 01/06/2013 0511   LDLCALC 98 01/06/2013 0511   Pro B Natriuretic peptide (BNP)  Date/Time Value Range Status  01/05/2013  6:47 PM 359.9* 0 - 125 pg/mL Final    Recent Labs  01/05/13 2220  INR 1.26   Radiology: Dg Chest Port 1 View 01/05/2013   CLINICAL DATA:  Chest pain. History of cardiac disease.  EXAM: PORTABLE CHEST - 1 VIEW  COMPARISON:  03/09/2008  FINDINGS: The heart size and mediastinal contours are within normal limits. Both lungs are clear. The visualized skeletal structures are unremarkable.  IMPRESSION: No active disease.   Electronically Signed   By: Amie Portland   On: 01/05/2013 21:02   Cardiac Cath: 02/05/2013 Left mainstem:  Normal.  Left anterior descending (LAD): The LAD is a large vessel extending to the apex. There is a stent noted in the proximal vessel that is widely patent. There is no significant disease in the LAD. The first diagonal is moderate in size and appears normal. The second diagonal is very small and normal.  Left circumflex (LCx): The left circumflex gives rise to 2 marginal branches. In the proximal vessel there is a stented segment. There is diffuse in-stent disease up to 50-70%.  Right coronary artery (RCA): The RCA is a large dominant vessel. It is occluded in the mid vessel. There are  left to right collaterals to the distal RCA branches.  Left ventriculography: Left ventricular systolic function is abnormal, LVEF is estimated at 40-45%, there is severe hypokinesis to akinesis of the basal to mid inferior wall, there is no significant mitral regurgitation Final Conclusions:  1. Single vessel occlusive coronary disease. The right coronary is occluded in the mid vessel with left to right collaterals. This appears to be his culprit vessel.  2. Patent stent in the proximal LAD.  3. Stent in the proximal left circumflex with moderate diffuse disease. Fractional flow reserve of 0.84 suggests that this is not hemodynamically significant.  4. Moderate left ventricular dysfunction.  Recommendations: Recommend medical management. Given his recent infarct I would recommend the addition of Plavix  EKG: Sinus rhythm, no ST elevation  Echo: 01/06/2013 Study Conclusions - Left ventricle: There is akinesis of the basal and mid inferior walls and hypokinesis of the basal and mid inferolateral walls. The cavity size was normal. Wall thickness was normal. Systolic function was mildly to moderately reduced. The estimated ejection fraction was in the range of 40% to 45%. Doppler parameters are consistent with abnormal left ventricular relaxation (grade 1 diastolic dysfunction). - Aortic valve: Aortic valve is poorly visualized but is possibly a bicuspid valve. There was no stenosis. No significant regurgitation. - Ascending aorta: The ascending aorta was normal in size. - Mitral valve: No significant regurgitation. - Left atrium: The atrium was mildly dilated. - Right ventricle: Systolic function was normal. - Right atrium: The atrium was normal in size. - Atrial septum: No defect or patent foramen ovale was identified. - Tricuspid valve: No significant regurgitation. - Pulmonary arteries: Systolic pressure was within the normal range. - Inferior vena cava: The vessel was normal in  size; the respirophasic diameter changes were in the normal range (= 50%); findings are consistent with normal central venous pressure. - Pericardium, extracardiac: There was no pericardial effusion.   FOLLOW UP PLANS AND APPOINTMENTS No Known Allergies   Medication List         aspirin 81 MG EC tablet  Take 1 tablet (81 mg total) by mouth daily.     atorvastatin 80 MG tablet  Commonly known as:  LIPITOR  Take 1 tablet (80 mg total) by mouth daily.     clopidogrel 75 MG tablet  Commonly known as:  PLAVIX  Take 1 tablet (75 mg total) by mouth daily.     lisinopril 5 MG tablet  Commonly known as:  PRINIVIL,ZESTRIL  Take 1 tablet (5 mg total) by mouth daily.     metoprolol tartrate 25 MG tablet  Commonly known as:  LOPRESSOR  Take 1 tablet (25 mg total) by mouth 2 (two) times daily.     nitroGLYCERIN 0.4 MG SL tablet  Commonly known as:  NITROSTAT  Place 1 tablet (0.4 mg total) under the tongue every 5 (five) minutes  as needed for chest pain.        Discharge Orders   Future Orders Complete By Expires   Diet - low sodium heart healthy  As directed    Increase activity slowly  As directed      Follow-up Information   Follow up with Cassell Clement, MD. (The office will call.)    Specialty:  Cardiology   Contact information:   [address] [place] [address] [phone]       BRING ALL MEDICATIONS WITH YOU TO FOLLOW UP APPOINTMENTS  Time spent with patient to include physician time: 38 min Signed: Theodore Demark, PA-C 01/07/2013, 9:59 AM Co-Sign MD

## 2013-01-07 NOTE — Progress Notes (Signed)
Subjective:  Patient doing well post-cath. Did not require PCI. Has occlusion of his mid-RCA with left-to-right collaterals. Medical therapy. Patient is not having any further chest pain. EKG stable. Troponins still positive. Has walked in hall without symptoms.  Objective:  Vital Signs in the last 24 hours: Temp:  [98.1 F (36.7 C)-100.1 F (37.8 C)] 99.2 F (37.3 C) (09/24 0642) Pulse Rate:  [68-83] 74 (09/24 0642) Resp:  [18] 18 (09/24 0642) BP: (99-118)/(68-88) 110/73 mmHg (09/24 0642) SpO2:  [94 %-99 %] 98 % (09/24 0642)  Intake/Output from previous day: 09/23 0701 - 09/24 0700 In: 720 [P.O.:720] Out: 400 [Urine:400] Intake/Output from this shift:    . aspirin EC  81 mg Oral Daily  . atorvastatin  80 mg Oral q1800  . clopidogrel  75 mg Oral Q breakfast  . heparin  5,000 Units Subcutaneous Q8H  . lisinopril  5 mg Oral Daily  . metoprolol tartrate  25 mg Oral BID  . potassium chloride  20 mEq Oral Daily  . sodium chloride  3 mL Intravenous Q12H      Physical Exam: The patient appears to be in no distress.  Head and neck exam reveals that the pupils are equal and reactive.  The extraocular movements are full.  There is no scleral icterus.  Mouth and pharynx are benign.  No lymphadenopathy.  No carotid bruits.  The jugular venous pressure is normal.  Thyroid is not enlarged or tender.  Chest is clear to percussion and auscultation.  No rales or rhonchi.  Expansion of the chest is symmetrical.  Heart reveals no abnormal lift or heave.  First and second heart sounds are normal.  There is no murmur gallop rub or click.  The abdomen is soft and nontender.  Bowel sounds are normoactive.  There is no hepatosplenomegaly or mass.  There are no abdominal bruits.  Extremities reveal no phlebitis or edema.  Pedal pulses are good.  There is no cyanosis or clubbing.  Neurologic exam is normal strength and no lateralizing weakness.  No sensory deficits.  Integument reveals no  rash  Lab Results:  Recent Labs  01/06/13 0511 01/07/13 0545  WBC 7.3 6.4  HGB 12.9* 13.0  PLT 195 202    Recent Labs  01/06/13 0511 01/07/13 0545  NA 140 136  K 3.4* 3.6  CL 105 101  CO2 25 26  GLUCOSE 95 88  BUN 13 10  CREATININE 0.86 0.88    Recent Labs  01/06/13 1120 01/07/13 0545  TROPONINI 2.76* 3.97*   Hepatic Function Panel  Recent Labs  01/06/13 0511  PROT 6.7  ALBUMIN 3.1*  AST 34  ALT 23  ALKPHOS 83  BILITOT 0.5    Recent Labs  01/06/13 0511  CHOL 164   No results found for this basename: PROTIME,  in the last 72 hours  Imaging: Dg Chest Port 1 View  01/05/2013   CLINICAL DATA:  Chest pain. History of cardiac disease.  EXAM: PORTABLE CHEST - 1 VIEW  COMPARISON:  03/09/2008  FINDINGS: The heart size and mediastinal contours are within normal limits. Both lungs are clear. The visualized skeletal structures are unremarkable.  IMPRESSION: No active disease.   Electronically Signed   By: Amie Portland   On: 01/05/2013 21:02    Cardiac Studies: 2D echo : Left ventricle: There is akinesis of the basal and mid inferior walls and hypokinesis of the basal and mid inferolateral walls. The cavity size was normal. Wall thickness was  normal. Systolic function was mildly to moderately reduced. The estimated ejection fraction was in the range of 40% to 45%. Doppler parameters are consistent with abnormal left ventricular relaxation (grade 1 diastolic dysfunction). - Aortic valve: Aortic valve is poorly visualized but is possibly a bicuspid valve. There was no stenosis. No significant regurgitation. - Ascending aorta: The ascending aorta was normal in size. - Mitral valve: No significant regurgitation. - Left atrium: The atrium was mildly dilated. - Right ventricle: Systolic function was normal. - Right atrium: The atrium was normal in size. - Atrial septum: No defect or patent foramen ovale was identified. - Tricuspid valve: No significant  regurgitation. - Pulmonary arteries: Systolic pressure was within the normal range. - Inferior vena cava: The vessel was normal in size; the respirophasic diameter changes were in the normal range (= 50%); findings are consistent with normal central venous pressure. - Pericardium, extracardiac: There was no pericardial effusion.  Assessment/Plan:  1. Single vessel occlusive coronary disease. The right coronary is occluded in the mid vessel with left to right collaterals. This appears to be his culprit vessel.  2. Patent stent in the proximal LAD.  3. Stent in the proximal left circumflex with moderate diffuse disease. Fractional flow reserve of 0.84 suggests that this is not hemodynamically significant.  4. Moderate left ventricular dysfunction. EF 40-45 % 5. NSTEMI.  No further chest pain. Tolerating walking in hall 6. Hypokalemia, resolved. 7. Tobacco abuse.  Plan:  Home today. BP is adequate today to start lisinopril 5 mg daily. Return to see Dr. Patty Sermons next Wednesday Oct 1 for OV, EKG and BMET. Referral to [facility]. No work until after seen in office followup. Smoking cessation.  LOS: 2 days    [name] 01/07/2013, 8:16 AM

## 2013-01-07 NOTE — Progress Notes (Signed)
Patient is ready for DC home, waiting on family. Patient reports he understands all DC instructions, follow up appointments, and Rx medications. Patient described how to safely take his Nitro Rx at home if needed.

## 2013-01-07 NOTE — Telephone Encounter (Signed)
Left message to call back to schedule appointment next week

## 2013-01-07 NOTE — Care Management Note (Signed)
    Page 1 of 1   01/07/2013     11:15:14 AM   CARE MANAGEMENT NOTE 01/07/2013  Patient:  [name], [name]   Account Number:  [id number]  Date Initiated:  01/06/2013  Documentation initiated by:  Junius Creamer  Subjective/Objective Assessment:   adm w mi     Action/Plan:   lives w fam, no ins listed   Anticipated DC Date:     Anticipated DC Plan:        DC Planning Services  CM consult      Choice offered to / List presented to:             Status of service:  Completed, signed off Medicare Important Message given?   (If response is "NO", the following Medicare IM given date fields will be blank) Date Medicare IM given:   Date Additional Medicare IM given:    Discharge Disposition:  HOME/SELF CARE  Per UR Regulation:  Reviewed for med. necessity/level of care/duration of stay  If discussed at Long Length of Stay Meetings, dates discussed:    Comments:  01-07-13 [address], [place] [phone] CM did call and make pt an appointment for PCP f/u at the [facility] for 01-28-13 @ 0945 and and orange card appointment for medication assistance @  10:30. Orange card paperwork given to pt at time of visit. CM did discuss with pt the places to buy cheaper meds  such as [facility]. Pt states he has a [facility] discount card and will try there first. Pt states will be ablel to afford meds so no asistance from CM via [facility] at this time. Statin to be changed to a cheaper one. No further needs form CM.

## 2013-01-07 NOTE — Telephone Encounter (Signed)
New Problem  Pt states the medication that was prescribed at ED are expensive because he does not have insurance... wanted to know if there is a program that can assist him financially w/ medications until he is able to get insurance.

## 2013-01-12 NOTE — Telephone Encounter (Signed)
Tried to call patients home number listed and was told that I had the wrong number. Did confirm I was dialing right number was told yes but wrong number

## 2013-01-21 ENCOUNTER — Other Ambulatory Visit: Payer: Self-pay

## 2013-01-21 ENCOUNTER — Encounter (HOSPITAL_COMMUNITY)
Admission: EM | Disposition: A | Discharge status: Rehabilitation Facility | Payer: Self-pay | Source: Home / Self Care | Attending: Cardiovascular Disease

## 2013-01-21 ENCOUNTER — Inpatient Hospital Stay (HOSPITAL_COMMUNITY)
Admission: EM | Admit: 2013-01-21 | Discharge: 2013-02-04 | DRG: 228 | Disposition: A | Discharge status: Rehabilitation Facility | Payer: Medicaid Other | Attending: Thoracic Surgery (Cardiothoracic Vascular Surgery) | Admitting: Thoracic Surgery (Cardiothoracic Vascular Surgery)

## 2013-01-21 ENCOUNTER — Encounter: Payer: Self-pay | Admitting: *Deleted

## 2013-01-21 ENCOUNTER — Encounter (HOSPITAL_COMMUNITY): Payer: Self-pay | Admitting: Emergency Medicine

## 2013-01-21 DIAGNOSIS — I6340 Cerebral infarction due to embolism of unspecified cerebral artery: Secondary | ICD-10-CM | POA: Diagnosis not present

## 2013-01-21 DIAGNOSIS — Z87891 Personal history of nicotine dependence: Secondary | ICD-10-CM

## 2013-01-21 DIAGNOSIS — G8190 Hemiplegia, unspecified affecting unspecified side: Secondary | ICD-10-CM | POA: Diagnosis not present

## 2013-01-21 DIAGNOSIS — E785 Hyperlipidemia, unspecified: Secondary | ICD-10-CM | POA: Diagnosis present

## 2013-01-21 DIAGNOSIS — H53469 Homonymous bilateral field defects, unspecified side: Secondary | ICD-10-CM | POA: Diagnosis not present

## 2013-01-21 DIAGNOSIS — I2510 Atherosclerotic heart disease of native coronary artery without angina pectoris: Secondary | ICD-10-CM

## 2013-01-21 DIAGNOSIS — I314 Cardiac tamponade: Secondary | ICD-10-CM | POA: Diagnosis present

## 2013-01-21 DIAGNOSIS — I253 Aneurysm of heart: Secondary | ICD-10-CM | POA: Diagnosis present

## 2013-01-21 DIAGNOSIS — D62 Acute posthemorrhagic anemia: Secondary | ICD-10-CM | POA: Diagnosis not present

## 2013-01-21 DIAGNOSIS — J90 Pleural effusion, not elsewhere classified: Secondary | ICD-10-CM | POA: Diagnosis not present

## 2013-01-21 DIAGNOSIS — A419 Sepsis, unspecified organism: Secondary | ICD-10-CM | POA: Diagnosis not present

## 2013-01-21 DIAGNOSIS — R570 Cardiogenic shock: Secondary | ICD-10-CM | POA: Diagnosis not present

## 2013-01-21 DIAGNOSIS — I252 Old myocardial infarction: Secondary | ICD-10-CM

## 2013-01-21 DIAGNOSIS — Q2111 Secundum atrial septal defect: Secondary | ICD-10-CM

## 2013-01-21 DIAGNOSIS — I309 Acute pericarditis, unspecified: Secondary | ICD-10-CM | POA: Diagnosis present

## 2013-01-21 DIAGNOSIS — I639 Cerebral infarction, unspecified: Secondary | ICD-10-CM

## 2013-01-21 DIAGNOSIS — Z9861 Coronary angioplasty status: Secondary | ICD-10-CM

## 2013-01-21 DIAGNOSIS — Z7902 Long term (current) use of antithrombotics/antiplatelets: Secondary | ICD-10-CM

## 2013-01-21 DIAGNOSIS — Q211 Atrial septal defect: Secondary | ICD-10-CM

## 2013-01-21 DIAGNOSIS — I241 Dressler's syndrome: Principal | ICD-10-CM | POA: Diagnosis present

## 2013-01-21 DIAGNOSIS — N170 Acute kidney failure with tubular necrosis: Secondary | ICD-10-CM | POA: Diagnosis not present

## 2013-01-21 DIAGNOSIS — I330 Acute and subacute infective endocarditis: Secondary | ICD-10-CM | POA: Diagnosis not present

## 2013-01-21 DIAGNOSIS — Z79899 Other long term (current) drug therapy: Secondary | ICD-10-CM

## 2013-01-21 DIAGNOSIS — Z7982 Long term (current) use of aspirin: Secondary | ICD-10-CM

## 2013-01-21 DIAGNOSIS — I214 Non-ST elevation (NSTEMI) myocardial infarction: Secondary | ICD-10-CM | POA: Diagnosis present

## 2013-01-21 DIAGNOSIS — D689 Coagulation defect, unspecified: Secondary | ICD-10-CM | POA: Diagnosis not present

## 2013-01-21 DIAGNOSIS — E876 Hypokalemia: Secondary | ICD-10-CM | POA: Diagnosis not present

## 2013-01-21 DIAGNOSIS — R509 Fever, unspecified: Secondary | ICD-10-CM | POA: Diagnosis present

## 2013-01-21 DIAGNOSIS — D696 Thrombocytopenia, unspecified: Secondary | ICD-10-CM | POA: Diagnosis not present

## 2013-01-21 DIAGNOSIS — I2582 Chronic total occlusion of coronary artery: Secondary | ICD-10-CM | POA: Diagnosis present

## 2013-01-21 DIAGNOSIS — D638 Anemia in other chronic diseases classified elsewhere: Secondary | ICD-10-CM | POA: Diagnosis present

## 2013-01-21 DIAGNOSIS — J9600 Acute respiratory failure, unspecified whether with hypoxia or hypercapnia: Secondary | ICD-10-CM | POA: Diagnosis not present

## 2013-01-21 DIAGNOSIS — R079 Chest pain, unspecified: Secondary | ICD-10-CM | POA: Diagnosis present

## 2013-01-21 HISTORY — DX: Ischemic cardiomyopathy: I25.5

## 2013-01-21 HISTORY — PX: LEFT HEART CATHETERIZATION WITH CORONARY ANGIOGRAM: SHX5451

## 2013-01-21 HISTORY — DX: Tobacco use: Z72.0

## 2013-01-21 HISTORY — DX: Hyperlipidemia, unspecified: E78.5

## 2013-01-21 LAB — CBC
HCT: 34 % — ABNORMAL LOW (ref 39.0–52.0)
Hemoglobin: 11.9 g/dL — ABNORMAL LOW (ref 13.0–17.0)
MCH: 27.7 pg (ref 26.0–34.0)
MCH: 27.6 pg (ref 26.0–34.0)
MCHC: 33.8 g/dL (ref 30.0–36.0)
MCV: 81.1 fL (ref 78.0–100.0)
MCV: 81.7 fL (ref 78.0–100.0)
Platelets: 183 10*3/uL (ref 150–400)
RBC: 4.29 MIL/uL (ref 4.22–5.81)
RDW: 12.9 % (ref 11.5–15.5)
RDW: 12.8 % (ref 11.5–15.5)
WBC: 16.4 10*3/uL — ABNORMAL HIGH (ref 4.0–10.5)

## 2013-01-21 LAB — BASIC METABOLIC PANEL WITH GFR
BUN: 23 mg/dL (ref 6–23)
CO2: 22 meq/L (ref 19–32)
Calcium: 8.5 mg/dL (ref 8.4–10.5)
Chloride: 92 meq/L — ABNORMAL LOW (ref 96–112)
Creatinine, Ser: 1.04 mg/dL (ref 0.50–1.35)
GFR calc Af Amer: >90 mL/min
GFR calc non Af Amer: 81 mL/min — ABNORMAL LOW
Glucose, Bld: 114 mg/dL — ABNORMAL HIGH (ref 70–99)
Potassium: 3.5 meq/L (ref 3.5–5.1)
Sodium: 130 meq/L — ABNORMAL LOW (ref 135–145)

## 2013-01-21 LAB — CREATININE, SERUM: GFR calc Af Amer: >90 mL/min (ref >90)

## 2013-01-21 LAB — TROPONIN I: Troponin I: 0.9 ng/mL (ref <0.30)

## 2013-01-21 LAB — PRO B NATRIURETIC PEPTIDE: Pro B Natriuretic peptide (BNP): 1951 pg/mL — ABNORMAL HIGH (ref 0–125)

## 2013-01-21 SURGERY — LEFT HEART CATHETERIZATION WITH CORONARY ANGIOGRAM
Anesthesia: LOCAL

## 2013-01-21 MED ORDER — NITROGLYCERIN 0.4 MG SL SUBL
0.4000 mg | SUBLINGUAL_TABLET | SUBLINGUAL | Status: DC | PRN
  Filled 2013-01-21: qty 25

## 2013-01-21 MED ORDER — SIMVASTATIN 40 MG PO TABS
40.0000 mg | ORAL_TABLET | Freq: Every day | ORAL | Status: DC
  Administered 2013-01-21 – 2013-01-27 (×7): 40 mg via ORAL
  Filled 2013-01-21 (×8): qty 1

## 2013-01-21 MED ORDER — NITROGLYCERIN 0.2 MG/ML ON CALL CATH LAB
INTRAVENOUS | Status: AC
  Filled 2013-01-21: qty 1

## 2013-01-21 MED ORDER — SODIUM CHLORIDE 0.9 % IV SOLN: INTRAVENOUS | Status: AC

## 2013-01-21 MED ORDER — HEPARIN SODIUM (PORCINE) 5000 UNIT/ML IJ SOLN
INTRAMUSCULAR | Status: AC
  Filled 2013-01-21: qty 1

## 2013-01-21 MED ORDER — METOPROLOL TARTRATE 25 MG PO TABS
25.0000 mg | ORAL_TABLET | Freq: Two times a day (BID) | ORAL | Status: DC
  Administered 2013-01-21 – 2013-01-27 (×4): 25 mg via ORAL
  Filled 2013-01-21 (×15): qty 1

## 2013-01-21 MED ORDER — HEPARIN SODIUM (PORCINE) 5000 UNIT/ML IJ SOLN
60.0000 [IU]/kg | Freq: Once | INTRAMUSCULAR | Status: AC
  Administered 2013-01-21: 4000 [IU] via INTRAVENOUS

## 2013-01-21 MED ORDER — HEPARIN (PORCINE) IN NACL 2-0.9 UNIT/ML-% IJ SOLN
INTRAMUSCULAR | Status: AC
  Filled 2013-01-21: qty 1000

## 2013-01-21 MED ORDER — HEPARIN SODIUM (PORCINE) 1000 UNIT/ML IJ SOLN
INTRAMUSCULAR | Status: AC
  Filled 2013-01-21: qty 1

## 2013-01-21 MED ORDER — ONDANSETRON HCL 4 MG/2ML IJ SOLN
4.0000 mg | Freq: Four times a day (QID) | INTRAMUSCULAR | Status: DC | PRN
  Administered 2013-01-23 – 2013-01-26 (×2): 4 mg via INTRAVENOUS
  Filled 2013-01-21: qty 2

## 2013-01-21 MED ORDER — VERAPAMIL HCL 2.5 MG/ML IV SOLN
INTRAVENOUS | Status: AC
  Filled 2013-01-21: qty 2

## 2013-01-21 MED ORDER — SODIUM CHLORIDE 0.9 % IV SOLN: 250.0000 mL | INTRAVENOUS | Status: DC | PRN

## 2013-01-21 MED ORDER — SODIUM CHLORIDE 0.9 % IV SOLN
INTRAVENOUS | Status: DC
  Administered 2013-01-28 (×3): via INTRAVENOUS

## 2013-01-21 MED ORDER — FENTANYL CITRATE 0.05 MG/ML IJ SOLN
INTRAMUSCULAR | Status: AC
  Filled 2013-01-21: qty 2

## 2013-01-21 MED ORDER — NITROGLYCERIN 0.4 MG SL SUBL: 0.4000 mg | SUBLINGUAL_TABLET | SUBLINGUAL | Status: DC | PRN

## 2013-01-21 MED ORDER — ASPIRIN 81 MG PO CHEW: 324.0000 mg | CHEWABLE_TABLET | Freq: Once | ORAL | Status: DC

## 2013-01-21 MED ORDER — MORPHINE SULFATE 2 MG/ML IJ SOLN
2.0000 mg | INTRAMUSCULAR | Status: DC | PRN
  Administered 2013-01-21 – 2013-01-23 (×2): 2 mg via INTRAVENOUS
  Filled 2013-01-21 (×3): qty 1

## 2013-01-21 MED ORDER — SODIUM CHLORIDE 0.9 % IJ SOLN
3.0000 mL | Freq: Two times a day (BID) | INTRAMUSCULAR | Status: DC
  Administered 2013-01-22 – 2013-01-27 (×7): 3 mL via INTRAVENOUS
  Administered 2013-01-27: 08:00:00 via INTRAVENOUS

## 2013-01-21 MED ORDER — ACETAMINOPHEN 325 MG PO TABS
650.0000 mg | ORAL_TABLET | ORAL | Status: DC | PRN
  Administered 2013-01-22: 650 mg via ORAL
  Filled 2013-01-21 (×3): qty 2

## 2013-01-21 MED ORDER — CLOPIDOGREL BISULFATE 75 MG PO TABS
75.0000 mg | ORAL_TABLET | Freq: Every day | ORAL | Status: DC
  Administered 2013-01-21 – 2013-01-27 (×7): 75 mg via ORAL
  Filled 2013-01-21 (×7): qty 1

## 2013-01-21 MED ORDER — ISOSORBIDE MONONITRATE ER 30 MG PO TB24
30.0000 mg | ORAL_TABLET | Freq: Every day | ORAL | Status: DC
  Administered 2013-01-21: 30 mg via ORAL
  Filled 2013-01-21 (×2): qty 1

## 2013-01-21 MED ORDER — ACETAMINOPHEN 325 MG PO TABS
650.0000 mg | ORAL_TABLET | ORAL | Status: DC | PRN
  Administered 2013-01-21 – 2013-01-26 (×4): 650 mg via ORAL
  Filled 2013-01-21 (×2): qty 2

## 2013-01-21 MED ORDER — KETOROLAC TROMETHAMINE 30 MG/ML IJ SOLN
30.0000 mg | Freq: Once | INTRAMUSCULAR | Status: AC
  Administered 2013-01-21: 30 mg via INTRAVENOUS
  Filled 2013-01-21: qty 1

## 2013-01-21 MED ORDER — LIDOCAINE HCL (PF) 1 % IJ SOLN
INTRAMUSCULAR | Status: AC
  Filled 2013-01-21: qty 30

## 2013-01-21 MED ORDER — LISINOPRIL 5 MG PO TABS
5.0000 mg | ORAL_TABLET | Freq: Every day | ORAL | Status: DC
  Administered 2013-01-21: 5 mg via ORAL
  Filled 2013-01-21 (×2): qty 1

## 2013-01-21 MED ORDER — MIDAZOLAM HCL 2 MG/2ML IJ SOLN
INTRAMUSCULAR | Status: AC
  Filled 2013-01-21: qty 2

## 2013-01-21 MED ORDER — ONDANSETRON HCL 4 MG/2ML IJ SOLN
4.0000 mg | Freq: Four times a day (QID) | INTRAMUSCULAR | Status: DC | PRN
  Administered 2013-01-23: 4 mg via INTRAVENOUS
  Filled 2013-01-21 (×2): qty 2

## 2013-01-21 MED ORDER — COLCHICINE 0.6 MG PO TABS
0.6000 mg | ORAL_TABLET | Freq: Two times a day (BID) | ORAL | Status: DC
  Administered 2013-01-22 – 2013-01-27 (×12): 0.6 mg via ORAL
  Filled 2013-01-21 (×15): qty 1

## 2013-01-21 MED ORDER — ASPIRIN EC 81 MG PO TBEC
81.0000 mg | DELAYED_RELEASE_TABLET | Freq: Every day | ORAL | Status: DC
  Administered 2013-01-21 – 2013-01-27 (×7): 81 mg via ORAL
  Filled 2013-01-21 (×8): qty 1

## 2013-01-21 MED ORDER — SODIUM CHLORIDE 0.9 % IJ SOLN: 3.0000 mL | INTRAMUSCULAR | Status: DC | PRN

## 2013-01-21 NOTE — Telephone Encounter (Signed)
Mailed letter to contact office to schedule appointment

## 2013-01-21 NOTE — H&P (Signed)
Patient ID: [name] MRN: [id number], DOB/AGE: [birth year]   Admit date: 01/21/2013  Primary Cardiologist: Wylene Simmer, MD   Pt. Profile:  52 y/o male with h/o CAD s/p recent NSTEMI who presented to the ED today with recurrent chest pain and anterolateral ST elevation.  Problem List  Past Medical History  Diagnosis Date  . Coronary artery disease     a. s/p MI in 2009 in [place] with stenting of the LCX and LAD;  b. 12/2012 NSTEMI/CAD: LM nl, LAD patent prox stent, LCX 50-70 isr (FFR 0.84), RCA dom, 12m, EF 40-45%, sev basal to mid inf HK to AK-->Med Rx.  . Tobacco abuse     a. quit 12/2012.  . Ischemic cardiomyopathy     a. 12/2012 Ech: EF 40-45%, Gr 1 DD.  Marland Kitchen Hyperlipidemia     Past Surgical History  Procedure Laterality Date  . Coronary angioplasty with stent placement  2000's X 2    "1 + 1" (01/06/2013)  . Cardiac catheterization  01/06/2013     Allergies  No Known Allergies  HPI  5 y /o male with prior h/o CAD s/p prior MI's in [place] with stenting of the LAD and LCX.  He was admitted 2 wks ago to [facility] with recurrent chest pain and r/i for NSTEMI.  Cath showed patent LAD stent, 50-70 % ISR within the LCX stent, with normal FFR, and an occluded RCA, which was medically managed.  He was d/c on 9/24 on asa, statin, bb, plavix, and ace inhibitor therapy.  Following d/c, he did well, says that he quit smoking, and has been compliant with his home medications.  Last night he developed recurrent chest pain and dyspnea, which persisted throughout the night.  He was restless all night and this AM.  He presented to the ED this afternoon with ongoing chest pain and initial ECG shows anterolateral ST elevation.  He has been taken to the Cath lab for further eval.  Home Medications  Prior to Admission medications   Medication Sig Start Date End Date Taking? Authorizing Provider  aspirin EC 81 MG EC tablet Take 1 tablet (81 mg total) by mouth daily. 01/07/13   Rhonda G Barrett,  PA-C  clopidogrel (PLAVIX) 75 MG tablet Take 1 tablet (75 mg total) by mouth daily. 01/07/13   Rhonda G Barrett, PA-C  lisinopril (PRINIVIL,ZESTRIL) 5 MG tablet Take 1 tablet (5 mg total) by mouth daily. 01/07/13   Rhonda G Barrett, PA-C  metoprolol tartrate (LOPRESSOR) 25 MG tablet Take 1 tablet (25 mg total) by mouth 2 (two) times daily. 01/07/13   Rhonda G Barrett, PA-C  nitroGLYCERIN (NITROSTAT) 0.4 MG SL tablet Place 1 tablet (0.4 mg total) under the tongue every 5 (five) minutes as needed for chest pain. 01/07/13   Rhonda G Barrett, PA-C  simvastatin (ZOCOR) 40 MG tablet Take 1 tablet (40 mg total) by mouth at bedtime. 01/07/13   Joline Salt Barrett, PA-C    Family History  Family History  Problem Relation Age of Onset  . CAD      Social History  History   Social History  . Marital Status: Single    Spouse Name: N/A    Number of Children: N/A  . Years of Education: N/A   Occupational History  . Not on file.   Social History Main Topics  . Smoking status: Former Smoker -- 1.00 packs/day for 35 years    Types: Cigarettes    Quit date: 01/06/2013  .  Smokeless tobacco: Never Used  . Alcohol Use: No  . Drug Use: No  . Sexual Activity: Yes   Other Topics Concern  . Not on file   Social History Narrative   Lives in [place].  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  Quit smoking after MI 12/2012.     Review of Systems General:  No chills, fever, night sweats or weight changes.  Cardiovascular:  +++ chest pain and dyspnea, no edema, orthopnea, palpitations, paroxysmal nocturnal dyspnea. Dermatological: No rash, lesions/masses Respiratory: No cough, +++ dyspnea Urologic: No hematuria, dysuria Abdominal:   No nausea, vomiting, diarrhea, bright red blood per rectum, melena, or hematemesis Neurologic:  No visual changes, wkns, changes in mental status. All other systems reviewed and are otherwise negative except as noted above.  Physical Exam  Blood pressure 81/54,  pulse 103, temperature 99 F (37.2 C), temperature source Oral, resp. rate 18, SpO2 97.00%.  General: Pleasant, NAD Psych: Normal affect. Neuro: Alert and oriented X 3. Moves all extremities spontaneously. HEENT: Normal  Neck: Supple without bruits or JVD. Lungs:  Resp regular and unlabored, CTA. Heart: RRR no s3, s4, or murmurs. Abdomen: Soft, non-tender, non-distended, BS + x 4.  Extremities: No clubbing, cyanosis or edema. DP/PT/Radials 2+ and equal bilaterally.  Labs   Lab Results  Component Value Date   WBC 20.1* 01/21/2013   HGB 11.9* 01/21/2013   HCT 34.8* 01/21/2013   MCV 81.1 01/21/2013   PLT 183 01/21/2013    bmet pending.  Radiology/Studies  None  ECG  Sinus tach, 106, inf q's, new anterolateral ST elevation.  ASSESSMENT AND PLAN  1.  Acute anterolateral STEMI/CAD:  Emergent cath.  Plan to cont asa/plavix/bb/acei/statin.  Further recs following cath.  2.  HL:  Cont statin.  3. Tob Abuse:  Quit 2 wks ago.  Continued cessation advised.  4.  Leukocytosis:  ? Myocarditis/pericarditis.  Check UA/CXR.  Signed, Nicolasa Ducking, NP 01/21/2013, 3:40 PM  I have personally seen and examined this patient with Ward Givens, NP. I agree with the assessment and plan as outlined above. Chest pain and EKG changes concerning for acute anterolateral STEMI. Emergent cath. Further plans to follow.   MCALHANY,CHRISTOPHER 8:06 PM 01/21/2013

## 2013-01-21 NOTE — CV Procedure (Signed)
Cardiac Catheterization Operative Report  [name] [id number] 10/8/20143:52 PM No PCP Per Patient  Procedure Performed:  1. Left Heart Catheterization 2. Selective Coronary Angiography 3. Left ventricular angiogram  Operator: Verne Carrow, MD  Arterial access site:  Right radial artery.   Indication: 52 yo male with history of CAD, previous stenting of LAD with recent admission with NSTEMI September 2014. Cardiac cath 01/06/13 with occluded RCA with collaterals, moderate Circumflex disease in the proximal segment (FFR 0.84), patent LAD stents. He was managed medically. Now presenting with severe chest pain. There are diffuse ST segment elevations in leads 1, aVL, V2, V3, V4. Pt taken to cath lab for emergent cardiac cath.                                        Procedure Details: The risks, benefits, complications, treatment options, and expected outcomes were discussed with the patient. The patient and/or family concurred with the proposed plan, giving informed consent. The patient was brought to the cath lab after IV hydration was begun and oral premedication was given. The patient was further sedated with Versed and Fentanyl. The right wrist was assessed with an Allens test which was positive. The right wrist was prepped and draped in a sterile fashion. 1% lidocaine was used for local anesthesia. Using the modified Seldinger access technique, a 5 French sheath was placed in the right radial artery. 3 mg Verapamil was given through the sheath. 4000 units IV heparin was given in the ED just before arrival to the cath lab. Standard diagnostic catheters were used to perform selective coronary angiography. A pigtail catheter was used to perform a left ventricular angiogram. The sheath was removed from the right radial artery and a Terumo hemostasis band was applied at the arteriotomy site on the right wrist.    There were no immediate complications. The patient was taken to  the recovery area in stable condition.   Hemodynamic Findings: Central aortic pressure: 85/58 Left ventricular pressure: 87/5/16  Angiographic Findings:  Left anterior descending (LAD): The LAD is a large vessel extending to the apex. There is a stent noted in the proximal vessel that is widely patent. There is no significant disease in the LAD. The first diagonal is moderate in size and appears normal. The second diagonal is very small and normal.   Left circumflex (LCx): The left circumflex gives rise to 2 marginal branches. In the proximal vessel there is a stented segment. There is diffuse in-stent disease up to 60%. Unchanged from cath 01/06/13.   Right coronary artery (RCA): The RCA is a large dominant vessel. It is occluded in the mid vessel. There are left to right collaterals to the distal RCA branches. Unchanged from cath 01/06/13.   Left ventriculography: Left ventricular systolic function is abnormal, LVEF is estimated at 35-40%. There is severe hypokinesis to akinesis of the basal to mid inferior wall, there is no significant mitral regurgitation.  Impression: 1. Triple vessel CAD with patent stents LAD and Circumflex, known occlusion of RCA with good left to right collaterals.  2. Chest pain, no culprit vessel found.  3. Moderate LV systolic dysfunction.  4. Possible myopericarditis vs vasospasm.   Recommendations: Continue medical management. Will add Imdur for possible vasospasm and colchicine for possible pericarditis.        Complications:  None. The patient tolerated the procedure well.

## 2013-01-21 NOTE — ED Notes (Signed)
Per pt chest pain that started last night. sts also back pain. sts some SOB. Pt had MI last week.

## 2013-01-21 NOTE — ED Provider Notes (Signed)
CSN: [id number]     Arrival date & time 01/21/13  1448 History   None    Chief Complaint  Patient presents with  . Chest Pain   (Consider location/radiation/quality/duration/timing/severity/associated sxs/prior Treatment) HPI  52 yo with known CAD who presents with CP.  Patient was cathed last week with no intervention.  Patient reports substernal CP since last night.  Pain is worse with exertion.  Rated 10/10.  Associated SOB and diaphoresis.    EKG from triage shows acute ST elevation in anterior leads.  STEMI alert called.    Past Medical History  Diagnosis Date  . Coronary artery disease     a. s/p MI in 2009 in [place] with stenting of the LCX and LAD;  b. 12/2012 NSTEMI/CAD: LM nl, LAD patent prox stent, LCX 50-70 isr (FFR 0.84), RCA dom, 126m, EF 40-45%, sev basal to mid inf HK to AK-->Med Rx.  . Tobacco abuse     a. quit 12/2012.  . Ischemic cardiomyopathy     a. 12/2012 Ech: EF 40-45%, Gr 1 DD.  Marland Kitchen Hyperlipidemia    Past Surgical History  Procedure Laterality Date  . Coronary angioplasty with stent placement  2000's X 2    "1 + 1" (01/06/2013)  . Cardiac catheterization  01/06/2013   Family History  Problem Relation Age of Onset  . CAD     History  Substance Use Topics  . Smoking status: Former Smoker -- 1.00 packs/day for 35 years    Types: Cigarettes    Quit date: 01/06/2013  . Smokeless tobacco: Never Used  . Alcohol Use: No    Review of Systems  Constitutional: Negative.  Negative for fever.  HENT: Negative for congestion.   Respiratory: Positive for chest tightness and shortness of breath.   Cardiovascular: Positive for chest pain.  Gastrointestinal: Negative.  Negative for nausea, vomiting and abdominal pain.  Genitourinary: Negative.  Negative for dysuria.  Musculoskeletal: Positive for back pain.  Skin: Negative for rash and wound.  Neurological: Negative for headaches.  Psychiatric/Behavioral: The patient is not nervous/anxious.   All other systems  reviewed and are negative.    Allergies  Review of patient's allergies indicates no known allergies.  Home Medications  No current outpatient prescriptions on file. BP 96/67  Pulse 96  Temp(Src) 99 F (37.2 C) (Oral)  Resp 35  Ht 6\' 1"  (1.854 m)  Wt 229 lb 11.5 oz (104.2 kg)  BMI 30.31 kg/m2  SpO2 98% Physical Exam  Nursing note and vitals reviewed. Constitutional: He is oriented to person, place, and time. He appears well-developed and well-nourished. No distress.  HENT:  Head: Normocephalic and atraumatic.  Eyes: Pupils are equal, round, and reactive to light.  Neck: Neck supple.  Cardiovascular: Normal rate, regular rhythm and normal heart sounds.   No murmur heard. Pulmonary/Chest: Effort normal and breath sounds normal. No respiratory distress. He has no wheezes.  Abdominal: Soft. Bowel sounds are normal. There is no tenderness. There is no rebound.  Musculoskeletal: He exhibits no edema.  Lymphadenopathy:    He has no cervical adenopathy.  Neurological: He is alert and oriented to person, place, and time.  Skin: Skin is warm and dry.  Psychiatric: He has a normal mood and affect.    ED Course  Procedures (including critical care time) Labs Review Labs Reviewed  CBC - Abnormal; Notable for the following:    WBC 20.1 (*)    Hemoglobin 11.9 (*)    HCT 34.8 (*)  All other components within normal limits  BASIC METABOLIC PANEL - Abnormal; Notable for the following:    Sodium 130 (*)    Chloride 92 (*)    Glucose, Bld 114 (*)    GFR calc non Af Amer 81 (*)    All other components within normal limits  PRO B NATRIURETIC PEPTIDE - Abnormal; Notable for the following:    Pro B Natriuretic peptide (BNP) 1951.0 (*)    All other components within normal limits  CBC - Abnormal; Notable for the following:    WBC 16.4 (*)    RBC 4.16 (*)    Hemoglobin 11.5 (*)    HCT 34.0 (*)    All other components within normal limits  MRSA PCR SCREENING  URINALYSIS, ROUTINE W  REFLEX MICROSCOPIC  TROPONIN I  TROPONIN I  TROPONIN I  CREATININE, SERUM  COMPREHENSIVE METABOLIC PANEL    Medications  0.9 %  sodium chloride infusion (not administered)  heparin 5000 UNIT/ML injection (not administered)  aspirin EC tablet 81 mg (81 mg Oral Given 01/21/13 1758)  clopidogrel (PLAVIX) tablet 75 mg (75 mg Oral Given 01/21/13 1757)  lisinopril (PRINIVIL,ZESTRIL) tablet 5 mg (5 mg Oral Given 01/21/13 1759)  metoprolol tartrate (LOPRESSOR) tablet 25 mg (not administered)  nitroGLYCERIN (NITROSTAT) SL tablet 0.4 mg (not administered)  simvastatin (ZOCOR) tablet 40 mg (not administered)  nitroGLYCERIN (NITROSTAT) SL tablet 0.4 mg (not administered)  acetaminophen (TYLENOL) tablet 650 mg (not administered)  ondansetron (ZOFRAN) injection 4 mg (not administered)  sodium chloride 0.9 % injection 3 mL (not administered)  sodium chloride 0.9 % injection 3 mL (not administered)  0.9 %  sodium chloride infusion (not administered)  isosorbide mononitrate (IMDUR) 24 hr tablet 30 mg (30 mg Oral Given 01/21/13 1759)  colchicine tablet 0.6 mg (0.6 mg Oral Not Given 01/21/13 1758)  acetaminophen (TYLENOL) tablet 650 mg (not administered)  ondansetron (ZOFRAN) injection 4 mg (not administered)  0.9 %  sodium chloride infusion ( Intravenous Rate/Dose Change 01/21/13 1645)  lidocaine (PF) (XYLOCAINE) 1 % injection (not administered)  nitroGLYCERIN (NTG ON-CALL) 0.2 mg/mL injection (not administered)  heparin 2-0.9 UNIT/ML-% infusion (not administered)  heparin injection 60 Units/kg (4,000 Units Intravenous Given 01/21/13 1513)  verapamil (ISOPTIN) 2.5 MG/ML injection (not administered)  heparin 1000 UNIT/ML injection (not administered)  midazolam (VERSED) 2 MG/2ML injection (not administered)  fentaNYL (SUBLIMAZE) 0.05 MG/ML injection (not administered)   Imaging Review No results found.  EKG:  Sinus tachycardia with rate of 106, ST elevation noted in V2, V3 which is new from prior MDM    1. Chest pain     Patient with STEMI on EKG. Emergently evaluated by cardiology and taken to the Cath Lab. Initial troponin 0.9.   Shon Baton, MD 01/22/13 [phone]

## 2013-01-22 ENCOUNTER — Inpatient Hospital Stay (HOSPITAL_COMMUNITY): Payer: Medicaid Other

## 2013-01-22 DIAGNOSIS — E876 Hypokalemia: Secondary | ICD-10-CM

## 2013-01-22 DIAGNOSIS — R509 Fever, unspecified: Secondary | ICD-10-CM | POA: Diagnosis present

## 2013-01-22 DIAGNOSIS — I309 Acute pericarditis, unspecified: Secondary | ICD-10-CM | POA: Diagnosis present

## 2013-01-22 DIAGNOSIS — I517 Cardiomegaly: Secondary | ICD-10-CM

## 2013-01-22 DIAGNOSIS — I214 Non-ST elevation (NSTEMI) myocardial infarction: Secondary | ICD-10-CM

## 2013-01-22 LAB — COMPREHENSIVE METABOLIC PANEL
ALT: 24 U/L (ref 0–53)
CO2: 25 mEq/L (ref 19–32)
Calcium: 8.4 mg/dL (ref 8.4–10.5)
Creatinine, Ser: 1.17 mg/dL (ref 0.50–1.35)
GFR calc Af Amer: 82 mL/min — ABNORMAL LOW (ref >90)
Glucose, Bld: 96 mg/dL (ref 70–99)
Sodium: 133 mEq/L — ABNORMAL LOW (ref 135–145)
Total Protein: 7 g/dL (ref 6.0–8.3)

## 2013-01-22 LAB — URINALYSIS, ROUTINE W REFLEX MICROSCOPIC
Leukocytes, UA: NEGATIVE
Nitrite: NEGATIVE
Specific Gravity, Urine: 1.034 — ABNORMAL HIGH (ref 1.005–1.030)
Urobilinogen, UA: 4 mg/dL — ABNORMAL HIGH (ref 0.0–1.0)
pH: 5.5 (ref 5.0–8.0)

## 2013-01-22 LAB — URINE MICROSCOPIC-ADD ON

## 2013-01-22 LAB — TROPONIN I: Troponin I: 1.09 ng/mL (ref <0.30)

## 2013-01-22 MED ORDER — DOPAMINE-DEXTROSE 3.2-5 MG/ML-% IV SOLN
3.0000 ug/kg/min | INTRAVENOUS | Status: DC
  Administered 2013-01-22: 3 ug/kg/min via INTRAVENOUS
  Administered 2013-01-24: 12 ug/kg/min via INTRAVENOUS
  Administered 2013-01-26: 0.5 ug/kg/min via INTRAVENOUS
  Filled 2013-01-22 (×4): qty 250

## 2013-01-22 MED ORDER — PERFLUTREN LIPID MICROSPHERE
INTRAVENOUS | Status: AC
  Administered 2013-01-22: 2 mL
  Filled 2013-01-22: qty 10

## 2013-01-22 MED ORDER — INDOMETHACIN 50 MG PO CAPS
50.0000 mg | ORAL_CAPSULE | Freq: Three times a day (TID) | ORAL | Status: DC
  Administered 2013-01-22 – 2013-01-27 (×17): 50 mg via ORAL
  Filled 2013-01-22 (×21): qty 1

## 2013-01-22 MED ORDER — POTASSIUM CHLORIDE CRYS ER 20 MEQ PO TBCR
20.0000 meq | EXTENDED_RELEASE_TABLET | Freq: Once | ORAL | Status: AC
  Administered 2013-01-22: 20 meq via ORAL
  Filled 2013-01-22: qty 1

## 2013-01-22 MED ORDER — PERFLUTREN LIPID MICROSPHERE: 1.0000 mL | INTRAVENOUS | Status: AC | PRN

## 2013-01-22 MED ORDER — PANTOPRAZOLE SODIUM 40 MG PO TBEC
40.0000 mg | DELAYED_RELEASE_TABLET | Freq: Two times a day (BID) | ORAL | Status: DC
  Administered 2013-01-22 – 2013-01-27 (×12): 40 mg via ORAL
  Filled 2013-01-22 (×10): qty 1

## 2013-01-22 NOTE — Care Management Note (Signed)
    Page 1 of 1   01/22/2013     11:56:35 AM   CARE MANAGEMENT NOTE 01/22/2013  Patient:  [name], [name]   Account Number:  [id number]  Date Initiated:  01/22/2013  Documentation initiated by:  Junius Creamer  Subjective/Objective Assessment:   adm w ch pain, recent mi     Action/Plan:   lives w fam   Anticipated DC Date:     Anticipated DC Plan:  HOME/SELF CARE      DC Planning Services  CM consult      Choice offered to / List presented to:             Status of service:   Medicare Important Message given?   (If response is "NO", the following Medicare IM given date fields will be blank) Date Medicare IM given:   Date Additional Medicare IM given:    Discharge Disposition:    Per UR Regulation:  Reviewed for med. necessity/level of care/duration of stay  If discussed at Long Length of Stay Meetings, dates discussed:    Comments:  10/9 1154a debbie Eisha Chatterjee rn,bsn pt has appt for orange card and pcp w [facility]. his appt is for 01-28-13 at 9:45a.

## 2013-01-22 NOTE — Progress Notes (Signed)
1445 Came to see pt. Temp was 102.7 around 1100. Will continue to follow. Please write order for ambulation when pt to start walking.Luetta Nutting RNBSN

## 2013-01-22 NOTE — Progress Notes (Signed)
  Echocardiogram 2D Echocardiogram has been performed.  [name] 01/22/2013, 10:18 AM

## 2013-01-22 NOTE — Progress Notes (Signed)
TELEMETRY: Reviewed telemetry pt in NSR: Filed Vitals:   01/21/13 2300 01/22/13 0300 01/22/13 0500 01/22/13 0800  BP: 93/62 80/59 93/57  101/64  Pulse: 92 89 87 96  Temp: 102.3 F (39.1 C)  98.9 F (37.2 C) 99.5 F (37.5 C)  TempSrc: Oral  Oral Oral  Resp: 30 20 27 27   Height:      Weight:   227 lb 8.2 oz (103.2 kg)   SpO2: 94% 89% 94% 100%    Intake/Output Summary (Last 24 hours) at 01/22/13 0854 Last data filed at 01/22/13 0800  Gross per 24 hour  Intake    650 ml  Output    700 ml  Net    -50 ml    SUBJECTIVE Continues to complain of chest pain. Sharp, worse with deep breath or sitting up. Also complains of poor appetite for 2 weeks and gagging. Febrile to 102.3 last night.   LABS: Basic Metabolic Panel:  Recent Labs  09/81/19 1500 01/21/13 1700 01/22/13 0610  NA 130*  --  133*  K 3.5  --  3.3*  CL 92*  --  95*  CO2 22  --  25  GLUCOSE 114*  --  96  BUN 23  --  27*  CREATININE 1.04 0.91 1.17  CALCIUM 8.5  --  8.4   Liver Function Tests:  Recent Labs  01/22/13 0610  AST 34  ALT 24  ALKPHOS 156*  BILITOT 0.8  PROT 7.0  ALBUMIN 2.6*   No results found for this basename: LIPASE, AMYLASE,  in the last 72 hours CBC:  Recent Labs  01/21/13 1500 01/21/13 1700  WBC 20.1* 16.4*  HGB 11.9* 11.5*  HCT 34.8* 34.0*  MCV 81.1 81.7  PLT 183 176   Cardiac Enzymes:  Recent Labs  01/21/13 1642 01/21/13 2215 01/22/13 0610  TROPONINI 0.90* 1.09* 1.37*    Radiology/Studies:  Dg Chest Port 1 View  01/05/2013   CLINICAL DATA:  Chest pain. History of cardiac disease.  EXAM: PORTABLE CHEST - 1 VIEW  COMPARISON:  03/09/2008  FINDINGS: The heart size and mediastinal contours are within normal limits. Both lungs are clear. The visualized skeletal structures are unremarkable.  IMPRESSION: No active disease.   Electronically Signed   By: Amie Portland   On: 01/05/2013 21:02   Ecg: sinus tachy, diffuse ST elevation c/w pericarditis. Old inferior  infarct.  PHYSICAL EXAM General: Well developed, well nourished, in no acute distress. Head: Normocephalic, atraumatic, sclera non-icteric, no xanthomas, nares are without discharge. Neck: Negative for carotid bruits. JVD not elevated. Lungs: Clear bilaterally to auscultation without wheezes, rales, or rhonchi. Breathing is unlabored. Heart: RRR S1 S2 without murmurs, rubs, or gallops.  Abdomen: Soft, non-tender, non-distended with normoactive bowel sounds. No hepatomegaly. No rebound/guarding. No obvious abdominal masses. Msk:  Strength and tone appears normal for age. Extremities: No clubbing, cyanosis or edema.  Distal pedal pulses are 2+ and equal bilaterally. Neuro: Alert and oriented X 3. Moves all extremities spontaneously. Psych:  Responds to questions appropriately with a normal affect.  ASSESSMENT AND PLAN: 1. Acute pericarditis/myocarditis. ? Related to recent infarct. Awaiting Echo results. Cardiac cath stable with occluded RCA. Stents in LAD and LCx are patent. Will treat with Indocin and colchicine. GI prophylaxis with PPI.  2. Fever with leucocytosis-probably due to #1. Will check CXR and UA. If recurrent fever will get blood cultures. Hold antibiotics for now. 3.CAD with recent NSTEMI.  4. HL 5/ tobacco abuse.  Principal  Problem:   Acute pericarditis, unspecified Active Problems:   Chest pain   Fever    Leonides Schanz Greycen Felter [place] MD,FACC 01/22/2013 8:54 AM

## 2013-01-23 DIAGNOSIS — I319 Disease of pericardium, unspecified: Secondary | ICD-10-CM

## 2013-01-23 DIAGNOSIS — I314 Cardiac tamponade: Secondary | ICD-10-CM

## 2013-01-23 LAB — CBC
MCHC: 36.1 g/dL — ABNORMAL HIGH (ref 30.0–36.0)
RBC: 4.35 MIL/uL (ref 4.22–5.81)
RDW: 12.7 % (ref 11.5–15.5)

## 2013-01-23 LAB — BASIC METABOLIC PANEL
BUN: 29 mg/dL — ABNORMAL HIGH (ref 6–23)
CO2: 24 mEq/L (ref 19–32)
Calcium: 8.3 mg/dL — ABNORMAL LOW (ref 8.4–10.5)
Creatinine, Ser: 0.99 mg/dL (ref 0.50–1.35)
GFR calc Af Amer: >90 mL/min (ref >90)
GFR calc non Af Amer: >90 mL/min (ref >90)

## 2013-01-23 MED ORDER — SODIUM CHLORIDE 0.9 % IV SOLN
INTRAVENOUS | Status: DC
  Administered 2013-01-23: 12:00:00 via INTRAVENOUS

## 2013-01-23 MED ORDER — SODIUM CHLORIDE 0.9 % IV SOLN
INTRAVENOUS | Status: AC
  Administered 2013-01-24: 06:00:00 via INTRAVENOUS

## 2013-01-23 MED ORDER — SODIUM CHLORIDE 0.9 % IV SOLN
INTRAVENOUS | Status: AC
  Administered 2013-01-23: 23:00:00 via INTRAVENOUS

## 2013-01-23 MED ORDER — SODIUM CHLORIDE 0.9 % IV BOLUS (SEPSIS)
500.0000 mL | Freq: Once | INTRAVENOUS | Status: AC
  Administered 2013-01-23: 500 mL via INTRAVENOUS

## 2013-01-23 MED ORDER — SODIUM CHLORIDE 0.9 % IV BOLUS (SEPSIS)
250.0000 mL | Freq: Once | INTRAVENOUS | Status: AC
  Administered 2013-01-22: 250 mL via INTRAVENOUS

## 2013-01-23 NOTE — Progress Notes (Signed)
Patient ID: [name], male   DOB: July 09, 1960, 52 y.o.   MRN: [id number]     The patient was seen earlier today by Dr. Tenny Craw. She has been helping to manage him during the day. Her note from this morning is not yet complete.  Dr. Tenny Craw called me to reassess the patient this evening. 2-D echo is being done at this time to followup the pericardial effusion. The patient had an echo yesterday. There are significant wall motion abnormalities. There is also right ventricular dysfunction. I have reviewed the echo. There was only a very small pericardial effusion. There is marked dyskinesis at the base of the inferior wall.  The patient is receiving dopamine. [name] also receiving IV fluids.  As soon as the current echo is complete I will compared to the older study.  Jerral Bonito, MD

## 2013-01-23 NOTE — Progress Notes (Addendum)
Came in to see patient for hypotension. Discussed his case at length with Dr Myrtis Ser and Dr Adolm Joseph. We have reviewed his cath and echo films, EKG's, lab and radiographic data.   On exam, he is comfortable, alert, and oriented. Heart rate is 95-100 bpm, BP 120/73, R 24 JVP is elevated, lungs are clear, heart is RRR without murmur or rub, there is no peripheral edema.   EKG shows diffuse ST elevation most prominent anterolaterally with PR depression.   2D Echo: Study Conclusions  - Left ventricle: Akinesis inferolateral wall. Dyskinesis base inferior wall. The cavity size was normal. Wall thickness was increased in a pattern of mild LVH. The estimated ejection fraction was 35%. - Right ventricle: The cavity size was moderately dilated. Systolic function was moderately reduced. - Right atrium: The atrium was mildly dilated. - Pericardium, extracardiac: Pericardial effusion is only moderate, but it is larger than yesterday. There is some RV collapse. I am concerned about possible tamponade heodynamics.  His symptoms, clinical exam, and EKG are consistent with acute pericarditis. Echo shows an enlarging pericardial effusion that is now moderate. He has significant RV and LV dysfunction with dyskinesis of the basal inferior wall of the LV. There are early signs of tamponade, despite the fact that his effusion is only moderate.   Will give IV fluid bolus for hemodynamic support. He is being treated with indocin and colchicine for pericarditis. As his BP is improved and respiratory status is stable, will continue current support with a repeat echo and CXR in the am. I discussed with him that he may require a pericardiocentesis if there is clinical deterioration. I am concerned about risk with pericardiocentesis since his effusion is not large, but may have to proceed if he does not respond to IV fluid resuscitation. He understands the plan. Will give one liter of 0.9% NS over the next 2 hours.  Continue dopamine at current rate. Pt has been transported to the CICU. Dr Adolm Joseph to follow his clinical course overnight.   Critical Care Time devoted to patient care services described in this note is 40 minutes, with greater than 50% of that time spent in direct patient care. Majority of time spent reviewing multiple data sets and cardiac imaging studies  and with hemodynamic management of cardiogenic shock.  Tonny Bollman 01/23/2013 9:13 PM

## 2013-01-23 NOTE — Progress Notes (Signed)
Subjective: Still with chest pains  Pleuritic, positional.   Objective: Filed Vitals:   01/23/13 0400 01/23/13 0500 01/23/13 0739 01/23/13 0800  BP: 103/59  96/48 93/57  Pulse:   66   Temp: 101 F (38.3 C)  98.8 F (37.1 C)   TempSrc: Oral  Oral   Resp: 33  18 26  Height:      Weight:  221 lb 5.5 oz (100.4 kg)    SpO2: 96%  98%    Weight change: -8 lb 6 oz (-3.8 kg)  Intake/Output Summary (Last 24 hours) at 01/23/13 1005 Last data filed at 01/23/13 0800  Gross per 24 hour  Intake    168 ml  Output    500 ml  Net   -332 ml   Tele  SR General: Alert, awake, oriented x3, in no acute distress Neck:  JVP is normal Heart: Regular rate and rhythm, without murmurs, rubs, gallops.  Lungs: Clear to auscultation.  No rales or wheezes. Exemities:  No edema.   Neuro: Grossly intact, nonfocal.   Lab Results: Results for orders placed during the hospital encounter of 01/21/13 (from the past 24 hour(s))  URINALYSIS, ROUTINE W REFLEX MICROSCOPIC     Status: Abnormal   Collection Time    01/22/13 11:32 AM      Result Value Range   Color, Urine AMBER (*) YELLOW   APPearance CLOUDY (*) CLEAR   Specific Gravity, Urine 1.034 (*) 1.005 - 1.030   pH 5.5  5.0 - 8.0   Glucose, UA NEGATIVE  NEGATIVE mg/dL   Hgb urine dipstick NEGATIVE  NEGATIVE   Bilirubin Urine SMALL (*) NEGATIVE   Ketones, ur 15 (*) NEGATIVE mg/dL   Protein, ur 30 (*) NEGATIVE mg/dL   Urobilinogen, UA 4.0 (*) 0.0 - 1.0 mg/dL   Nitrite NEGATIVE  NEGATIVE   Leukocytes, UA NEGATIVE  NEGATIVE  URINE MICROSCOPIC-ADD ON     Status: Abnormal   Collection Time    01/22/13 11:32 AM      Result Value Range   Squamous Epithelial / LPF RARE  RARE   WBC, UA 0-2  <3 WBC/hpf   RBC / HPF 0-2  <3 RBC/hpf   Bacteria, UA FEW (*) RARE  BASIC METABOLIC PANEL     Status: Abnormal   Collection Time    01/23/13  6:10 AM      Result Value Range   Sodium 131 (*) 135 - 145 mEq/L   Potassium 3.6  3.5 - 5.1 mEq/L   Chloride 95 (*) 96 -  112 mEq/L   CO2 24  19 - 32 mEq/L   Glucose, Bld 126 (*) 70 - 99 mg/dL   BUN 29 (*) 6 - 23 mg/dL   Creatinine, Ser 9.60  0.50 - 1.35 mg/dL   Calcium 8.3 (*) 8.4 - 10.5 mg/dL   GFR calc non Af Amer >90  >90 mL/min   GFR calc Af Amer >90  >90 mL/min  CBC     Status: Abnormal   Collection Time    01/23/13  6:10 AM      Result Value Range   WBC 14.9 (*) 4.0 - 10.5 K/uL   RBC 4.35  4.22 - 5.81 MIL/uL   Hemoglobin 12.6 (*) 13.0 - 17.0 g/dL   HCT 45.4 (*) 09.8 - 11.9 %   MCV 80.2  78.0 - 100.0 fL   MCH 29.0  26.0 - 34.0 pg   MCHC 36.1 (*) 30.0 - 36.0 g/dL  RDW 12.7  11.5 - 15.5 %   Platelets 189  150 - 400 K/uL    Studies/Results: @RISRSLT24 @  Medications:  Reviewed   @PROBHOSP @  1.  CP  Patient's pain consistent with pericardities.  Echo yesterday with trivial pericardial effusion.   COntinue meds    2.  CAD  Echo with LVEF 35 to 40%  Dyskinesis of basal inferior wall.  Patient with occluded RCA.  Stents to LAD and LCx patent.  Will continue medical Rx  3.  Fever.  UA negatvei.  CXR neg  Prob related to pericarditis.  Follow  Give IV fluids.  LOS: 2 days   Dietrich Pates 01/23/2013, 10:05 AM

## 2013-01-23 NOTE — Progress Notes (Signed)
Came to walk but pt had been up bathing and SBP 79. Also c/o nausea and CP 4/10. Assisted pt to bed and SBP 90. Will hold ambulation today. RN assisting.  Ethelda Chick CES, ACSM 2:58 PM 01/23/2013

## 2013-01-24 ENCOUNTER — Encounter (HOSPITAL_COMMUNITY)
Admission: EM | Disposition: A | Discharge status: Rehabilitation Facility | Payer: Self-pay | Source: Home / Self Care | Attending: Cardiovascular Disease

## 2013-01-24 DIAGNOSIS — I314 Cardiac tamponade: Secondary | ICD-10-CM

## 2013-01-24 DIAGNOSIS — I319 Disease of pericardium, unspecified: Secondary | ICD-10-CM

## 2013-01-24 HISTORY — PX: RIGHT HEART CATHETERIZATION: SHX5447

## 2013-01-24 HISTORY — PX: PERICARDIAL TAP: SHX5486

## 2013-01-24 LAB — CBC
Hemoglobin: 11.9 g/dL — ABNORMAL LOW (ref 13.0–17.0)
MCHC: 35.2 g/dL (ref 30.0–36.0)
Platelets: 201 10*3/uL (ref 150–400)
RBC: 4.18 MIL/uL — ABNORMAL LOW (ref 4.22–5.81)

## 2013-01-24 LAB — BODY FLUID CELL COUNT WITH DIFFERENTIAL
Lymphs, Fluid: 2 %
Neutrophil Count, Fluid: 95 % — ABNORMAL HIGH (ref 0–25)

## 2013-01-24 LAB — BASIC METABOLIC PANEL
GFR calc Af Amer: 79 mL/min — ABNORMAL LOW (ref >90)
GFR calc non Af Amer: 68 mL/min — ABNORMAL LOW (ref >90)
Potassium: 3.9 mEq/L (ref 3.5–5.1)
Sodium: 132 mEq/L — ABNORMAL LOW (ref 135–145)

## 2013-01-24 LAB — GLUCOSE, SEROUS FLUID

## 2013-01-24 LAB — AMYLASE, BODY FLUID: Amylase, Fluid: 21 U/L

## 2013-01-24 LAB — CREATININE, FLUID (PLEURAL, PERITONEAL, JP DRAINAGE): Creat, Fluid: 1.1 mg/dL

## 2013-01-24 LAB — PROTEIN, BODY FLUID

## 2013-01-24 LAB — LACTATE DEHYDROGENASE, PLEURAL OR PERITONEAL FLUID: LD, Fluid: 1821 U/L — ABNORMAL HIGH (ref 3–23)

## 2013-01-24 SURGERY — PERICARDIAL TAP
Anesthesia: LOCAL | Site: Groin | Laterality: Right

## 2013-01-24 MED ORDER — LIDOCAINE HCL (PF) 1 % IJ SOLN
INTRAMUSCULAR | Status: AC
  Filled 2013-01-24: qty 30

## 2013-01-24 MED ORDER — HEPARIN (PORCINE) IN NACL 2-0.9 UNIT/ML-% IJ SOLN
INTRAMUSCULAR | Status: AC
  Filled 2013-01-24: qty 500

## 2013-01-24 MED ORDER — DIAZEPAM 5 MG PO TABS: 5.0000 mg | ORAL_TABLET | Freq: Once | ORAL | Status: DC

## 2013-01-24 MED ORDER — FENTANYL CITRATE 0.05 MG/ML IJ SOLN
INTRAMUSCULAR | Status: AC
  Filled 2013-01-24: qty 2

## 2013-01-24 MED ORDER — MIDAZOLAM HCL 2 MG/2ML IJ SOLN
INTRAMUSCULAR | Status: AC
  Filled 2013-01-24: qty 2

## 2013-01-24 MED ORDER — DIAZEPAM 5 MG PO TABS
ORAL_TABLET | ORAL | Status: AC
  Administered 2013-01-24: 10:00:00
  Filled 2013-01-24: qty 1

## 2013-01-24 NOTE — H&P (View-Only) (Signed)
    SUBJECTIVE:  He does not feel well.  Still with SOB and nausea.  BP running low. HR increased.    PHYSICAL EXAM Filed Vitals:   01/24/13 0300 01/24/13 0400 01/24/13 0500 01/24/13 0600  BP: 92/68 93/64 98/70 107/72  Pulse: 117 112 115 117  Temp:  99 F (37.2 C)    TempSrc:  Oral    Resp: 29 28 23 25  Height:      Weight:   234 lb 2.1 oz (106.2 kg)   SpO2: 93% 95% 92% 92%   General:  No acute complaints.  Lungs:  Decreased breath sounds Heart:  RRR, no rub Abdomen:  .bowle Extremities:  No edema  LABS: Lab Results  Component Value Date   TROPONINI 1.37* 01/22/2013   Results for orders placed during the hospital encounter of 01/21/13 (from the past 24 hour(s))  BASIC METABOLIC PANEL     Status: Abnormal   Collection Time    01/24/13  6:13 AM      Result Value Range   Sodium 132 (*) 135 - 145 mEq/L   Potassium 3.9  3.5 - 5.1 mEq/L   Chloride 99  96 - 112 mEq/L   CO2 20  19 - 32 mEq/L   Glucose, Bld 152 (*) 70 - 99 mg/dL   BUN 34 (*) 6 - 23 mg/dL   Creatinine, Ser 1.20  0.50 - 1.35 mg/dL   Calcium 7.5 (*) 8.4 - 10.5 mg/dL   GFR calc non Af Amer 68 (*) >90 mL/min   GFR calc Af Amer 79 (*) >90 mL/min  CBC     Status: Abnormal   Collection Time    01/24/13  6:13 AM      Result Value Range   WBC 17.1 (*) 4.0 - 10.5 K/uL   RBC 4.18 (*) 4.22 - 5.81 MIL/uL   Hemoglobin 11.9 (*) 13.0 - 17.0 g/dL   HCT 33.8 (*) 39.0 - 52.0 %   MCV 80.9  78.0 - 100.0 fL   MCH 28.5  26.0 - 34.0 pg   MCHC 35.2  30.0 - 36.0 g/dL   RDW 13.0  11.5 - 15.5 %   Platelets 201  150 - 400 K/uL    Intake/Output Summary (Last 24 hours) at 01/24/13 0806 Last data filed at 01/24/13 0600  Gross per 24 hour  Intake 5272.99 ml  Output    750 ml  Net 4522.99 ml    ASSESSMENT AND PLAN:  CAD:  Stable anatomy by cath this admission.   PERICARDITIS:  Probable tamponade.  BP is improved.  However, heart rate still elevated.  He is getting an echo currently and I am reviewing this with Dr. Cooper.   It appears that the effusion has gotten larger.  He still has tamponade physiology.  He will go to the cath lab today for pericardiocentisis.     Frederick Marro 01/24/2013 8:06 AM   

## 2013-01-24 NOTE — Progress Notes (Signed)
Pt seen in follow-up for effusion/tamponade. I have discussed his clinical course with nursing overnight and things are unchanged despite aggressive IV fluids and dopamine. Heart rate is 120, BP 107/72. Bedside echo shows enlarging effusion. He continues to exhibit signs of cardiac tamponade and with enlarging effusion I think it is clear that urgent pericardiocentesis is indicated. I have reviewed the risks, indications, and alternatives with the patient who agrees to proceed.  [name] 01/24/2013 8:36 AM

## 2013-01-24 NOTE — Progress Notes (Signed)
    SUBJECTIVE:  He does not feel well.  Still with SOB and nausea.  BP running low. HR increased.    PHYSICAL EXAM Filed Vitals:   01/24/13 0300 01/24/13 0400 01/24/13 0500 01/24/13 0600  BP: 92/68 93/64 98/70  107/72  Pulse: 117 112 115 117  Temp:  99 F (37.2 C)    TempSrc:  Oral    Resp: 29 28 23 25   Height:      Weight:   234 lb 2.1 oz (106.2 kg)   SpO2: 93% 95% 92% 92%   General:  No acute complaints.  Lungs:  Decreased breath sounds Heart:  RRR, no rub Abdomen:  .bowle Extremities:  No edema  LABS: Lab Results  Component Value Date   TROPONINI 1.37* 01/22/2013   Results for orders placed during the hospital encounter of 01/21/13 (from the past 24 hour(s))  BASIC METABOLIC PANEL     Status: Abnormal   Collection Time    01/24/13  6:13 AM      Result Value Range   Sodium 132 (*) 135 - 145 mEq/L   Potassium 3.9  3.5 - 5.1 mEq/L   Chloride 99  96 - 112 mEq/L   CO2 20  19 - 32 mEq/L   Glucose, Bld 152 (*) 70 - 99 mg/dL   BUN 34 (*) 6 - 23 mg/dL   Creatinine, Ser 7.82  0.50 - 1.35 mg/dL   Calcium 7.5 (*) 8.4 - 10.5 mg/dL   GFR calc non Af Amer 68 (*) >90 mL/min   GFR calc Af Amer 79 (*) >90 mL/min  CBC     Status: Abnormal   Collection Time    01/24/13  6:13 AM      Result Value Range   WBC 17.1 (*) 4.0 - 10.5 K/uL   RBC 4.18 (*) 4.22 - 5.81 MIL/uL   Hemoglobin 11.9 (*) 13.0 - 17.0 g/dL   HCT 95.6 (*) 21.3 - 08.6 %   MCV 80.9  78.0 - 100.0 fL   MCH 28.5  26.0 - 34.0 pg   MCHC 35.2  30.0 - 36.0 g/dL   RDW 57.8  46.9 - 62.9 %   Platelets 201  150 - 400 K/uL    Intake/Output Summary (Last 24 hours) at 01/24/13 0806 Last data filed at 01/24/13 0600  Gross per 24 hour  Intake 5272.99 ml  Output    750 ml  Net 4522.99 ml    ASSESSMENT AND PLAN:  CAD:  Stable anatomy by cath this admission.   PERICARDITIS:  Probable tamponade.  BP is improved.  However, heart rate still elevated.  He is getting an echo currently and I am reviewing this with Dr. Excell Seltzer.   It appears that the effusion has gotten larger.  He still has tamponade physiology.  He will go to the cath lab today for pericardiocentisis.     Fayrene Fearing [facility] 01/24/2013 8:06 AM

## 2013-01-24 NOTE — CV Procedure (Signed)
   Cardiac Catheterization Procedure Note  Name: [name] MRN: [id number] DOB: 04-01-1961  Procedure: Right Heart Cath, Pericardiocentesis  Indication: Cardiac tamponade  Procedural Details: The right groin was prepped, draped, and anesthetized with 1% lidocaine. Using the modified Seldinger technique a 7 French sheath was placed in the right femoral vein. A Swan-Ganz catheter was used for the right heart catheterization. Standard protocol was followed for recording of right heart pressures and sampling of oxygen saturations. Fick cardiac output was calculated.   Procedural Findings: Hemodynamics RA 18 RV 38/18 PA 36/22 mean 27 PCWP 21/18/mean 19 LV not recorded AO not recorded  Oxygen saturations: PA 47 AO 89  Post-Pericardiocentesis Hemodynamics RA 12 RV 49/14 PA 50/24 mean 34 PCWP 23/26 mean 21  Pericardiocentesis: The subxiphoid area was prepped, draped and anesthetized with 1% lidocaine. Using a pericardial needle with stilette, the needle was advanced beyond the inferior rib margin. Using fluoroscopic guidance the needle was advanced into the pericardial space and a wire was advanced through the needle into the pericardial space. After passing a dilator, a side-hole catheter was advanced and 600 cc cloudy serosanguinous fluid was withdrawn. There was immediate improvement in his hemodynamics as above. Dopamine was reduced from 12 mcg down to 5 mcg/min while he was in the cath lab. There were no immediate procedural complications. He was transferred back to the cardiac ICU following the procedure.   Final Conclusions:   1. Cardiac tamponade 2. Successful pericardiocentesis  Recommendations: record pericardial output. Repeat 2D echo in am.   Tonny Bollman 01/24/2013, 10:57 AM

## 2013-01-24 NOTE — Interval H&P Note (Signed)
History and Physical Interval Note:  01/24/2013 9:30 AM  [name]  has presented today for surgery, with the diagnosis of Chest pain  The various methods of treatment have been discussed with the patient and family. After consideration of risks, benefits and other options for treatment, the patient has consented to right heart catheterization and pericardiocentesis as a surgical intervention .  The patient's history has been reviewed, patient examined, no change in status, stable for surgery.  I have reviewed the patient's chart and labs.  Questions were answered to the patient's satisfaction.     Tonny Bollman

## 2013-01-24 NOTE — Progress Notes (Signed)
Echocardiogram 2D Echocardiogram Limited Exam has been performed.  [name] 01/24/2013, 8:17 AM

## 2013-01-25 ENCOUNTER — Other Ambulatory Visit: Payer: Self-pay

## 2013-01-25 DIAGNOSIS — I319 Disease of pericardium, unspecified: Secondary | ICD-10-CM

## 2013-01-25 LAB — BASIC METABOLIC PANEL
BUN: 30 mg/dL — ABNORMAL HIGH (ref 6–23)
CO2: 20 mEq/L (ref 19–32)
Calcium: 7.2 mg/dL — ABNORMAL LOW (ref 8.4–10.5)
Creatinine, Ser: 0.83 mg/dL (ref 0.50–1.35)
GFR calc Af Amer: >90 mL/min (ref >90)
GFR calc non Af Amer: >90 mL/min (ref >90)
Potassium: 3.8 mEq/L (ref 3.5–5.1)

## 2013-01-25 LAB — CBC
Hemoglobin: 11.3 g/dL — ABNORMAL LOW (ref 13.0–17.0)
MCHC: 35.6 g/dL (ref 30.0–36.0)
Platelets: 225 10*3/uL (ref 150–400)
RDW: 13.2 % (ref 11.5–15.5)

## 2013-01-25 NOTE — Progress Notes (Addendum)
Echocardiogram 2D Echocardiogram limited exam has been performed.  [name] 01/25/2013, 8:25 AM

## 2013-01-25 NOTE — Progress Notes (Signed)
Pericardial drain has had minimal drainage since initially placed (less than 10 cc). Echo reviewed and small effusion persists. Drain pulled back with negative suction and I was able to drain an additional 30 cc fluid. Tube left in place and redressed in new position, left to section with hemovac in place. Would plan on removal in am.   Tonny Bollman 01/25/2013 11:59 AM

## 2013-01-25 NOTE — Progress Notes (Signed)
SUBJECTIVE:  He feels better but still with some SOB.  Mild pain.   PHYSICAL EXAM Filed Vitals:   01/25/13 0400 01/25/13 0500 01/25/13 0600 01/25/13 0700  BP: 89/46 92/57 101/55   Pulse: 79 83 84 76  Temp:      TempSrc:      Resp: 24 26 28 21   Height:      Weight:      SpO2: 94% 93% 94% 95%   General:  No acute complaints.  Neck:  External JVD Lungs:  Decreased breath sounds Heart:  RRR, no rub Abdomen:  Positive bowel sounds, no rebound no guarding Extremities:  No edema  LABS: Lab Results  Component Value Date   TROPONINI 1.37* 01/22/2013   Results for orders placed during the hospital encounter of 01/21/13 (from the past 24 hour(s))  LACTATE DEHYDROGENASE, BODY FLUID     Status: Abnormal   Collection Time    01/24/13 10:20 AM      Result Value Range   LD, Fluid 1821 (*) 3 - 23 U/L   Fluid Type-FLDH PERICARDIAL    AMYLASE, BODY FLUID     Status: None   Collection Time    01/24/13 10:20 AM      Result Value Range   Amylase, Fluid 21     Fluid Type-FAMY PERICARDIAL    PROTEIN, BODY FLUID     Status: None   Collection Time    01/24/13 10:20 AM      Result Value Range   Total protein, fluid 5.2     Fluid Type-FTP PERICARDIAL    CREATININE, BODY FLUID     Status: None   Collection Time    01/24/13 10:20 AM      Result Value Range   Creat, Fluid 1.1     Fluid Type-FCRE PERICARDIAL    GLUCOSE, SEROUS FLUID     Status: None   Collection Time    01/24/13 10:20 AM      Result Value Range   Glucose, Fluid 47     Fluid Type-FGLU PERICARDIAL    PH, BODY FLUID     Status: None   Collection Time    01/24/13 10:20 AM      Result Value Range   pH, Fluid Type PERICARDIAL  FLUID     pH, Fluid 7.50    BODY FLUID CELL COUNT WITH DIFFERENTIAL     Status: Abnormal   Collection Time    01/24/13 10:20 AM      Result Value Range   Fluid Type-FCT PERICARDIAL     Color, Fluid RED     Appearance, Fluid TURBID (*) CLEAR   WBC, Fluid 09811 (*) 0 - 1000 cu mm   Neutrophil Count, Fluid 95 (*) 0 - 25 %   Lymphs, Fluid 2     Monocyte-Macrophage-Serous Fluid 3 (*) 50 - 90 %  BASIC METABOLIC PANEL     Status: Abnormal   Collection Time    01/25/13  4:08 AM      Result Value Range   Sodium 134 (*) 135 - 145 mEq/L   Potassium 3.8  3.5 - 5.1 mEq/L   Chloride 103  96 - 112 mEq/L   CO2 20  19 - 32 mEq/L   Glucose, Bld 119 (*) 70 - 99 mg/dL   BUN 30 (*) 6 - 23 mg/dL   Creatinine, Ser 9.14  0.50 - 1.35 mg/dL   Calcium 7.2 (*) 8.4 - 10.5 mg/dL  GFR calc non Af Amer >90  >90 mL/min   GFR calc Af Amer >90  >90 mL/min  CBC     Status: Abnormal   Collection Time    01/25/13  4:08 AM      Result Value Range   WBC 12.9 (*) 4.0 - 10.5 K/uL   RBC 3.94 (*) 4.22 - 5.81 MIL/uL   Hemoglobin 11.3 (*) 13.0 - 17.0 g/dL   HCT 78.2 (*) 95.6 - 21.3 %   MCV 80.5  78.0 - 100.0 fL   MCH 28.7  26.0 - 34.0 pg   MCHC 35.6  30.0 - 36.0 g/dL   RDW 08.6  57.8 - 46.9 %   Platelets 225  150 - 400 K/uL    Intake/Output Summary (Last 24 hours) at 01/25/13 0738 Last data filed at 01/25/13 0700  Gross per 24 hour  Intake 2500.9 ml  Output   1800 ml  Net  700.9 ml    ASSESSMENT AND PLAN:  CAD:  Stable anatomy by cath this admission.   PERICARDITIS:  Status post perciardiocentesis.  BP and heart rate are improved.   Cultures and cytology are pending.  Other chemistries are reported but do not help to differentiate the etiology of the effusion.  This is consistent with a post myocardial injury syndrome (Dressler).  He has not had any drainage overnight but prior to this had 700 cc.  I will have them flush the tubing.  I am reluctant to pull the drain yet as it is likely that the inflammation and potential for reaccumulation still exists.  Try to wean dopamine completely today.  TOBACCO: Educate.  Rollene Rotunda 01/25/2013 7:38 AM

## 2013-01-26 LAB — CBC
HCT: 31.1 % — ABNORMAL LOW (ref 39.0–52.0)
Hemoglobin: 11.1 g/dL — ABNORMAL LOW (ref 13.0–17.0)
MCH: 29 pg (ref 26.0–34.0)
MCHC: 35.7 g/dL (ref 30.0–36.0)
MCV: 81.2 fL (ref 78.0–100.0)
Platelets: 288 10*3/uL (ref 150–400)
RBC: 3.83 MIL/uL — ABNORMAL LOW (ref 4.22–5.81)
RDW: 13.5 % (ref 11.5–15.5)
WBC: 10.5 10*3/uL (ref 4.0–10.5)

## 2013-01-26 LAB — POCT I-STAT 3, VENOUS BLOOD GAS (G3P V)
Acid-base deficit: 7 mmol/L — ABNORMAL HIGH (ref 0.0–2.0)
Acid-base deficit: 5 mmol/L — ABNORMAL HIGH (ref 0.0–2.0)
Bicarbonate: 17.8 meq/L — ABNORMAL LOW (ref 20.0–24.0)
Bicarbonate: 19.3 meq/L — ABNORMAL LOW (ref 20.0–24.0)
O2 Saturation: 47 %
O2 Saturation: 65 %
TCO2: 19 mmol/L (ref 0–100)
TCO2: 20 mmol/L (ref 0–100)
pCO2, Ven: 31.5 mmHg — ABNORMAL LOW (ref 45.0–50.0)
pCO2, Ven: 33.7 mmHg — ABNORMAL LOW (ref 45.0–50.0)
pH, Ven: 7.36 — ABNORMAL HIGH (ref 7.250–7.300)
pH, Ven: 7.367 — ABNORMAL HIGH (ref 7.250–7.300)
pO2, Ven: 26 mmHg — CL (ref 30.0–45.0)
pO2, Ven: 35 mmHg (ref 30.0–45.0)

## 2013-01-26 LAB — BASIC METABOLIC PANEL
CO2: 21 mEq/L (ref 19–32)
GFR calc Af Amer: >90 mL/min (ref >90)
GFR calc non Af Amer: >90 mL/min (ref >90)
Potassium: 3.5 mEq/L (ref 3.5–5.1)
Sodium: 131 mEq/L — ABNORMAL LOW (ref 135–145)

## 2013-01-26 LAB — PATHOLOGIST SMEAR REVIEW

## 2013-01-26 NOTE — Progress Notes (Signed)
TELEMETRY: Reviewed telemetry pt in NSR: Filed Vitals:   01/26/13 0800 01/26/13 0900 01/26/13 1000 01/26/13 1100  BP: 103/54 84/38 107/56 104/53  Pulse: 92 94 92 96  Temp: 98.2 F (36.8 C)   98.3 F (36.8 C)  TempSrc: Oral   Oral  Resp: 29 26 27 13   Height:      Weight:      SpO2: 90% 94% 93% 96%    Intake/Output Summary (Last 24 hours) at 01/26/13 1158 Last data filed at 01/26/13 1000  Gross per 24 hour  Intake 2116.72 ml  Output   1525 ml  Net 591.72 ml    SUBJECTIVE Feels well today. Mild back pain. No chest pain or SOB. Off Dopamine and BP is OK. 200 cc of fluid drainage from pericardial catheter.  LABS: Basic Metabolic Panel:  Recent Labs  44/01/02 0408 01/26/13 0355  NA 134* 131*  K 3.8 3.5  CL 103 100  CO2 20 21  GLUCOSE 119* 97  BUN 30* 21  CREATININE 0.83 0.74  CALCIUM 7.2* 7.3*   CBC:  Recent Labs  01/25/13 0408 01/26/13 0355  WBC 12.9* 10.5  HGB 11.3* 11.1*  HCT 31.7* 31.1*  MCV 80.5 81.2  PLT 225 288    Radiology/Studies:  Dg Chest 2 View  01/22/2013   *RADIOLOGY REPORT*  Clinical Data: Fever, shortness of breath, chest pain, initial encounter.  CHEST - 2 VIEW  Comparison: 01/05/2013; 03/09/2008  Findings:  Grossly unchanged, enlarged cardiac silhouette and mediastinal contours.  Improved aeration of the lungs.  The lungs appear hyperexpanded with flattening of the bilateral hemidiaphragms and mild diffuse slightly nodular thickening of the coronary interstitium.  Grossly unchanged bilateral infrahilar opacity favored to represent atelectasis or scar.  No new focal airspace opacity.  No evidence of edema.  No pleural effusion or pneumothorax.  Grossly unchanged bones.  IMPRESSION:  Stable findings of borderline cardiomegaly, bronchitic change and perihilar atelectasis/scar without acute cardiopulmonary disease   Original Report Authenticated By: Tacey Ruiz, MD   Dg Chest [facility]  01/05/2013   CLINICAL DATA:  Chest pain. History of  cardiac disease.  EXAM: PORTABLE CHEST - 1 VIEW  COMPARISON:  03/09/2008  FINDINGS: The heart size and mediastinal contours are within normal limits. Both lungs are clear. The visualized skeletal structures are unremarkable.  IMPRESSION: No active disease.   Electronically Signed   By: Amie Portland   On: 01/05/2013 21:02   Ecg: 01/25/13 NSR with inferior infarct. Persistent mild ST elevation diffusely.  PHYSICAL EXAM General: Well developed, well nourished, in no acute distress. Head: Normal Neck: Negative for carotid bruits. JVD not elevated. Lungs: Decreased BS at bases. Breathing is unlabored. Heart: RRR S1 S2 without murmurs, rubs, or gallops. Pericardial drain in place subxiphoid. Abdomen: Soft, non-tender, non-distended with normoactive bowel sounds. Msk:  Strength and tone appears normal for age. Extremities: No clubbing, cyanosis or edema.  Distal pedal pulses are 2+ and equal bilaterally. Neuro: Alert and oriented X 3. Moves all extremities spontaneously. Psych:  Responds to questions appropriately with a normal affect.  ASSESSMENT AND PLAN: 1. Acute pericarditis with pericardial effusion and tamponade. S/p pericardial drain. Hemodynamically improved. Still with significant drainage so I recommend leaving drain in for now. No fever and WBC decreasing so hopefully inflammation is improving. Will remove drain when output tapers off. 2. CAD with recent inferior infarct. Occluded RCA. Patent stents in LAD and LCx. 3. Tobacco abuse. 4. HL.  Principal Problem:  Acute pericarditis, unspecified Active Problems:   Chest pain   Fever   Pericardial tamponade    Signed, Madline Oesterling [place] MD,FACC 01/26/2013 12:05 PM

## 2013-01-26 NOTE — Progress Notes (Signed)
Cardiac Rehab 1420 Noted that pt is still having drainage from pericardial drain. Will await ambulation order. Beatrix Fetters, RN 2:24 PM 01/26/2013

## 2013-01-27 LAB — CBC
HCT: 31.8 % — ABNORMAL LOW (ref 39.0–52.0)
Hemoglobin: 11.1 g/dL — ABNORMAL LOW (ref 13.0–17.0)
MCH: 28.3 pg (ref 26.0–34.0)
MCV: 81.1 fL (ref 78.0–100.0)
Platelets: 361 10*3/uL (ref 150–400)
RBC: 3.92 MIL/uL — ABNORMAL LOW (ref 4.22–5.81)
WBC: 10.5 10*3/uL (ref 4.0–10.5)

## 2013-01-27 LAB — BASIC METABOLIC PANEL
CO2: 20 mEq/L (ref 19–32)
Calcium: 7.8 mg/dL — ABNORMAL LOW (ref 8.4–10.5)
Chloride: 101 mEq/L (ref 96–112)
GFR calc Af Amer: >90 mL/min (ref >90)
Glucose, Bld: 114 mg/dL — ABNORMAL HIGH (ref 70–99)
Potassium: 4.1 mEq/L (ref 3.5–5.1)
Sodium: 134 mEq/L — ABNORMAL LOW (ref 135–145)

## 2013-01-27 NOTE — Progress Notes (Signed)
CARDIAC REHAB PHASE I   PRE:  Rate/Rhythm: 94SR  BP:  Supine:   Sitting: 106/66  Standing:    SaO2:   MODE:  Ambulation: 180 ft   POST:  Rate/Rhythm: 103ST  BP:  Supine:   Sitting: 110/62  Standing:    SaO2:  1000-1020 Pt getting ready for transfer to 3W. Pt walked 180 ft on RA with asst x 1 with slow steady gait. Some lightheadedness throughout walk. No CP. To chair after walk.   Luetta Nutting, RN BSN  01/27/2013 10:14 AM

## 2013-01-27 NOTE — Progress Notes (Signed)
TELEMETRY: Reviewed telemetry pt in NSR: Filed Vitals:   01/27/13 0349 01/27/13 0405 01/27/13 0500 01/27/13 0600  BP:  92/69 112/61 100/67  Pulse:      Temp: 98.9 F (37.2 C)     TempSrc: Oral     Resp:  20 24 31   Height:      Weight: 236 lb 15.9 oz (107.5 kg)     SpO2:  95%      Intake/Output Summary (Last 24 hours) at 01/27/13 0654 Last data filed at 01/27/13 0500  Gross per 24 hour  Intake 1757.02 ml  Output    927 ml  Net 830.02 ml    SUBJECTIVE Feels well today.  No chest pain or SOB. Pericardial drainage has tapered off significantly.  LABS: Basic Metabolic Panel:  Recent Labs  56/21/30 0355 01/27/13 0500  NA 131* 134*  K 3.5 4.1  CL 100 101  CO2 21 20  GLUCOSE 97 114*  BUN 21 21  CREATININE 0.74 0.88  CALCIUM 7.3* 7.8*   CBC:  Recent Labs  01/26/13 0355 01/27/13 0500  WBC 10.5 10.5  HGB 11.1* 11.1*  HCT 31.1* 31.8*  MCV 81.2 81.1  PLT 288 361    Radiology/Studies:  Dg Chest 2 View  01/22/2013   *RADIOLOGY REPORT*  Clinical Data: Fever, shortness of breath, chest pain, initial encounter.  CHEST - 2 VIEW  Comparison: 01/05/2013; 03/09/2008  Findings:  Grossly unchanged, enlarged cardiac silhouette and mediastinal contours.  Improved aeration of the lungs.  The lungs appear hyperexpanded with flattening of the bilateral hemidiaphragms and mild diffuse slightly nodular thickening of the coronary interstitium.  Grossly unchanged bilateral infrahilar opacity favored to represent atelectasis or scar.  No new focal airspace opacity.  No evidence of edema.  No pleural effusion or pneumothorax.  Grossly unchanged bones.  IMPRESSION:  Stable findings of borderline cardiomegaly, bronchitic change and perihilar atelectasis/scar without acute cardiopulmonary disease   Original Report Authenticated By: Tacey Ruiz, MD   Dg Chest [facility]  01/05/2013   CLINICAL DATA:  Chest pain. History of cardiac disease.  EXAM: PORTABLE CHEST - 1 VIEW  COMPARISON:   03/09/2008  FINDINGS: The heart size and mediastinal contours are within normal limits. Both lungs are clear. The visualized skeletal structures are unremarkable.  IMPRESSION: No active disease.   Electronically Signed   By: Amie Portland   On: 01/05/2013 21:02   Ecg: 01/25/13 NSR with inferior infarct. Persistent mild ST elevation diffusely.  PHYSICAL EXAM General: Well developed, well nourished, in no acute distress. Head: Normal Neck: Negative for carotid bruits. JVD not elevated. Lungs: Decreased BS at bases. Breathing is unlabored. Heart: RRR S1 S2 without murmurs, rubs, or gallops. Pericardial drain in place subxiphoid. Abdomen: Soft, non-tender, non-distended with normoactive bowel sounds. Msk:  Strength and tone appears normal for age. Extremities: No clubbing, cyanosis or edema.  Distal pedal pulses are 2+ and equal bilaterally. Neuro: Alert and oriented X 3. Moves all extremities spontaneously. Psych:  Responds to questions appropriately with a normal affect.  ASSESSMENT AND PLAN: 1. 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 S/p pericardial drain. Hemodynamically improved. Drainage has tapered off significantly with only 7 cc this am. No fever and WBC decreasing so hopefully inflammation is improving. Pericardial drain removed this am. Will transfer to floor today. 2. CAD with recent inferior infarct. Occluded RCA. Patent stents in LAD and LCx. 3. Tobacco abuse. 4. HL.  Principal Problem:   Acute  pericarditis, unspecified Active Problems:   Chest pain   Fever   Pericardial tamponade    Signed, Glenisha Gundry [place] MD,FACC 01/27/2013 6:54 AM

## 2013-01-27 NOTE — Progress Notes (Signed)
Report called to RN Krisit on unit 3 West. Pt will tx to room 3 west 17.  Pt will advise family about moving to another room

## 2013-01-28 ENCOUNTER — Inpatient Hospital Stay (HOSPITAL_COMMUNITY): Payer: Medicaid Other | Admitting: Anesthesiology

## 2013-01-28 ENCOUNTER — Inpatient Hospital Stay (HOSPITAL_COMMUNITY): Payer: Medicaid Other

## 2013-01-28 ENCOUNTER — Encounter (HOSPITAL_COMMUNITY): Payer: Self-pay | Admitting: Radiology

## 2013-01-28 ENCOUNTER — Inpatient Hospital Stay: Payer: Self-pay

## 2013-01-28 ENCOUNTER — Encounter (HOSPITAL_COMMUNITY): Payer: Medicaid Other | Admitting: Anesthesiology

## 2013-01-28 ENCOUNTER — Ambulatory Visit: Payer: Self-pay

## 2013-01-28 ENCOUNTER — Encounter (HOSPITAL_COMMUNITY)
Admission: EM | Disposition: A | Discharge status: Rehabilitation Facility | Payer: Medicaid Other | Source: Home / Self Care | Attending: Cardiovascular Disease

## 2013-01-28 DIAGNOSIS — I314 Cardiac tamponade: Secondary | ICD-10-CM

## 2013-01-28 DIAGNOSIS — I319 Disease of pericardium, unspecified: Secondary | ICD-10-CM

## 2013-01-28 DIAGNOSIS — I6350 Cerebral infarction due to unspecified occlusion or stenosis of unspecified cerebral artery: Secondary | ICD-10-CM

## 2013-01-28 DIAGNOSIS — I2510 Atherosclerotic heart disease of native coronary artery without angina pectoris: Secondary | ICD-10-CM

## 2013-01-28 DIAGNOSIS — I253 Aneurysm of heart: Secondary | ICD-10-CM

## 2013-01-28 HISTORY — PX: VENTRICULAR ANEURYSM RESECTION: SHX6349

## 2013-01-28 HISTORY — PX: INTRAOPERATIVE TRANSESOPHAGEAL ECHOCARDIOGRAM: SHX5062

## 2013-01-28 HISTORY — PX: CORONARY ARTERY BYPASS GRAFT: SHX141

## 2013-01-28 LAB — DIC (DISSEMINATED INTRAVASCULAR COAGULATION)PANEL
D-Dimer, Quant: 3.06 ug/mL-FEU — ABNORMAL HIGH (ref 0.00–0.48)
Fibrinogen: 206 mg/dL (ref 204–475)
INR: 2.49 — ABNORMAL HIGH (ref 0.00–1.49)
Prothrombin Time: 26.1 seconds — ABNORMAL HIGH (ref 11.6–15.2)
aPTT: 63 seconds — ABNORMAL HIGH (ref 24–37)

## 2013-01-28 LAB — URINE MICROSCOPIC-ADD ON

## 2013-01-28 LAB — BASIC METABOLIC PANEL
BUN: 26 mg/dL — ABNORMAL HIGH (ref 6–23)
BUN: 28 mg/dL — ABNORMAL HIGH (ref 6–23)
CO2: 22 mEq/L (ref 19–32)
Calcium: 8.2 mg/dL — ABNORMAL LOW (ref 8.4–10.5)
Chloride: 98 mEq/L (ref 96–112)
Chloride: 103 mEq/L (ref 96–112)
Creatinine, Ser: 1.16 mg/dL (ref 0.50–1.35)
Creatinine, Ser: 1.4 mg/dL — ABNORMAL HIGH (ref 0.50–1.35)
GFR calc non Af Amer: 57 mL/min — ABNORMAL LOW (ref >90)
Glucose, Bld: 119 mg/dL — ABNORMAL HIGH (ref 70–99)
Glucose, Bld: 165 mg/dL — ABNORMAL HIGH (ref 70–99)
Potassium: 4.5 mEq/L (ref 3.5–5.1)
Sodium: 136 mEq/L (ref 135–145)

## 2013-01-28 LAB — SURGICAL PCR SCREEN: MRSA, PCR: NEGATIVE

## 2013-01-28 LAB — BODY FLUID CULTURE

## 2013-01-28 LAB — URINALYSIS, ROUTINE W REFLEX MICROSCOPIC
Nitrite: POSITIVE — AB
Specific Gravity, Urine: 1.03 (ref 1.005–1.030)
Urobilinogen, UA: 4 mg/dL — ABNORMAL HIGH (ref 0.0–1.0)
pH: 5 (ref 5.0–8.0)

## 2013-01-28 LAB — POCT I-STAT 3, ART BLOOD GAS (G3+)
Acid-base deficit: 5 mmol/L — ABNORMAL HIGH (ref 0.0–2.0)
Acid-base deficit: 11 mmol/L — ABNORMAL HIGH (ref 0.0–2.0)
Acid-base deficit: 8 mmol/L — ABNORMAL HIGH (ref 0.0–2.0)
Acid-base deficit: 4 mmol/L — ABNORMAL HIGH (ref 0.0–2.0)
Bicarbonate: 18.3 mEq/L — ABNORMAL LOW (ref 20.0–24.0)
Bicarbonate: 24.3 mEq/L — ABNORMAL HIGH (ref 20.0–24.0)
Bicarbonate: 23 mEq/L (ref 20.0–24.0)
O2 Saturation: 93 %
O2 Saturation: 100 %
O2 Saturation: 100 %
O2 Saturation: 99 %
O2 Saturation: 97 %
Patient temperature: 36.4
TCO2: 19 mmol/L (ref 0–100)
TCO2: 25 mmol/L (ref 0–100)
TCO2: 24 mmol/L (ref 0–100)
pCO2 arterial: 34 mmHg — ABNORMAL LOW (ref 35.0–45.0)
pCO2 arterial: 53.2 mmHg — ABNORMAL HIGH (ref 35.0–45.0)
pCO2 arterial: 32.3 mmHg — ABNORMAL LOW (ref 35.0–45.0)
pCO2 arterial: 51.4 mmHg — ABNORMAL HIGH (ref 35.0–45.0)
pH, Arterial: 7.132 — CL (ref 7.350–7.450)
pH, Arterial: 7.192 — CL (ref 7.350–7.450)
pH, Arterial: 7.29 — ABNORMAL LOW (ref 7.350–7.450)
pO2, Arterial: 82 mmHg (ref 80.0–100.0)
pO2, Arterial: 382 mmHg — ABNORMAL HIGH (ref 80.0–100.0)
pO2, Arterial: 292 mmHg — ABNORMAL HIGH (ref 80.0–100.0)
pO2, Arterial: 181 mmHg — ABNORMAL HIGH (ref 80.0–100.0)
pO2, Arterial: 95 mmHg (ref 80.0–100.0)

## 2013-01-28 LAB — PROTIME-INR
INR: 1.79 — ABNORMAL HIGH (ref 0.00–1.49)
Prothrombin Time: 21.4 seconds — ABNORMAL HIGH (ref 11.6–15.2)

## 2013-01-28 LAB — POCT I-STAT 4, (NA,K, GLUC, HGB,HCT)
Glucose, Bld: 186 mg/dL — ABNORMAL HIGH (ref 70–99)
Glucose, Bld: 204 mg/dL — ABNORMAL HIGH (ref 70–99)
Glucose, Bld: 206 mg/dL — ABNORMAL HIGH (ref 70–99)
Glucose, Bld: 176 mg/dL — ABNORMAL HIGH (ref 70–99)
Glucose, Bld: 172 mg/dL — ABNORMAL HIGH (ref 70–99)
Glucose, Bld: 159 mg/dL — ABNORMAL HIGH (ref 70–99)
HCT: 23 % — ABNORMAL LOW (ref 39.0–52.0)
HCT: 21 % — ABNORMAL LOW (ref 39.0–52.0)
HCT: 25 % — ABNORMAL LOW (ref 39.0–52.0)
HCT: 24 % — ABNORMAL LOW (ref 39.0–52.0)
Hemoglobin: 9.9 g/dL — ABNORMAL LOW (ref 13.0–17.0)
Hemoglobin: 7.8 g/dL — ABNORMAL LOW (ref 13.0–17.0)
Hemoglobin: 7.1 g/dL — ABNORMAL LOW (ref 13.0–17.0)
Hemoglobin: 7.8 g/dL — ABNORMAL LOW (ref 13.0–17.0)
Hemoglobin: 8.5 g/dL — ABNORMAL LOW (ref 13.0–17.0)
Hemoglobin: 8.2 g/dL — ABNORMAL LOW (ref 13.0–17.0)
Potassium: 5.2 mEq/L — ABNORMAL HIGH (ref 3.5–5.1)
Potassium: 5 mEq/L (ref 3.5–5.1)
Potassium: 4.9 mEq/L (ref 3.5–5.1)
Potassium: 5.3 mEq/L — ABNORMAL HIGH (ref 3.5–5.1)
Potassium: 5.7 mEq/L — ABNORMAL HIGH (ref 3.5–5.1)
Sodium: 137 mEq/L (ref 135–145)
Sodium: 137 mEq/L (ref 135–145)
Sodium: 138 mEq/L (ref 135–145)
Sodium: 138 mEq/L (ref 135–145)
Sodium: 140 mEq/L (ref 135–145)

## 2013-01-28 LAB — CBC
HCT: 29.1 % — ABNORMAL LOW (ref 39.0–52.0)
HCT: 23.4 % — ABNORMAL LOW (ref 39.0–52.0)
Hemoglobin: 10.2 g/dL — ABNORMAL LOW (ref 13.0–17.0)
Hemoglobin: 8.3 g/dL — ABNORMAL LOW (ref 13.0–17.0)
MCH: 28.5 pg (ref 26.0–34.0)
MCH: 30.3 pg (ref 26.0–34.0)
MCHC: 35.1 g/dL (ref 30.0–36.0)
MCHC: 35.5 g/dL (ref 30.0–36.0)
MCV: 81.3 fL (ref 78.0–100.0)
MCV: 85.4 fL (ref 78.0–100.0)
Platelets: 392 10*3/uL (ref 150–400)
RBC: 3.58 MIL/uL — ABNORMAL LOW (ref 4.22–5.81)
RBC: 2.74 MIL/uL — ABNORMAL LOW (ref 4.22–5.81)
RDW: 13.9 % (ref 11.5–15.5)
WBC: 11.5 10*3/uL — ABNORMAL HIGH (ref 4.0–10.5)
WBC: 16.6 10*3/uL — ABNORMAL HIGH (ref 4.0–10.5)

## 2013-01-28 LAB — HEMOGLOBIN AND HEMATOCRIT, BLOOD
HCT: 21.8 % — ABNORMAL LOW (ref 39.0–52.0)
Hemoglobin: 7.6 g/dL — ABNORMAL LOW (ref 13.0–17.0)

## 2013-01-28 LAB — DIC (DISSEMINATED INTRAVASCULAR COAGULATION) PANEL
Platelets: 101 10*3/uL — ABNORMAL LOW (ref 150–400)
Smear Review: NONE SEEN

## 2013-01-28 LAB — ABO/RH: ABO/RH(D): O POS

## 2013-01-28 LAB — PREPARE RBC (CROSSMATCH)

## 2013-01-28 LAB — GLUCOSE, CAPILLARY: Glucose-Capillary: 118 mg/dL — ABNORMAL HIGH (ref 70–99)

## 2013-01-28 LAB — APTT: aPTT: 47 seconds — ABNORMAL HIGH (ref 24–37)

## 2013-01-28 SURGERY — CORONARY ARTERY BYPASS GRAFTING (CABG)
Anesthesia: General | Site: Chest | Wound class: Clean

## 2013-01-28 MED ORDER — TEMAZEPAM 15 MG PO CAPS: 15.0000 mg | ORAL_CAPSULE | Freq: Once | ORAL | Status: DC | PRN

## 2013-01-28 MED ORDER — NITROGLYCERIN IN D5W 200-5 MCG/ML-% IV SOLN: 0.0000 ug/min | INTRAVENOUS | Status: DC

## 2013-01-28 MED ORDER — MIDAZOLAM HCL 2 MG/2ML IJ SOLN
4.0000 mg | Freq: Once | INTRAMUSCULAR | Status: AC
  Administered 2013-01-28 (×2): 2 mg via INTRAVENOUS

## 2013-01-28 MED ORDER — EPINEPHRINE HCL 1 MG/ML IJ SOLN
0.5000 ug/min | INTRAVENOUS | Status: DC
  Filled 2013-01-28: qty 4

## 2013-01-28 MED ORDER — PIPERACILLIN-TAZOBACTAM 3.375 G IVPB
3.3750 g | Freq: Three times a day (TID) | INTRAVENOUS | Status: DC
  Filled 2013-01-28 (×2): qty 50

## 2013-01-28 MED ORDER — SODIUM CHLORIDE 0.9 % IV SOLN
INTRAVENOUS | Status: DC
  Filled 2013-01-28: qty 1

## 2013-01-28 MED ORDER — POTASSIUM CHLORIDE 10 MEQ/50ML IV SOLN: 10.0000 meq | INTRAVENOUS | Status: AC

## 2013-01-28 MED ORDER — BISACODYL 5 MG PO TBEC
10.0000 mg | DELAYED_RELEASE_TABLET | Freq: Every day | ORAL | Status: DC
  Administered 2013-01-30 – 2013-01-31 (×3): 10 mg via ORAL
  Filled 2013-01-28 (×2): qty 2

## 2013-01-28 MED ORDER — CHLORHEXIDINE GLUCONATE 4 % EX LIQD: 60.0000 mL | Freq: Once | CUTANEOUS | Status: DC

## 2013-01-28 MED ORDER — SODIUM CHLORIDE 0.9 % IV SOLN
3.0000 ug/kg/min | INTRAVENOUS | Status: DC
  Administered 2013-01-28: 3 ug/kg/min via INTRAVENOUS
  Filled 2013-01-28: qty 20

## 2013-01-28 MED ORDER — SODIUM CHLORIDE 0.9 % IV SOLN
INTRAVENOUS | Status: AC
  Administered 2013-01-28: 3.8 [IU]/h via INTRAVENOUS
  Filled 2013-01-28: qty 1

## 2013-01-28 MED ORDER — FLUCONAZOLE 100MG IVPB: 100.0000 mg | INTRAVENOUS | Status: DC

## 2013-01-28 MED ORDER — ACETAMINOPHEN 160 MG/5ML PO SOLN
1000.0000 mg | Freq: Four times a day (QID) | ORAL | Status: AC
  Administered 2013-01-29 – 2013-01-30 (×6): 1000 mg
  Filled 2013-01-28 (×5): qty 40.6

## 2013-01-28 MED ORDER — DEXMEDETOMIDINE HCL IN NACL 200 MCG/50ML IV SOLN
0.1000 ug/kg/h | INTRAVENOUS | Status: DC
  Administered 2013-01-28: 0.7 ug/kg/h via INTRAVENOUS
  Administered 2013-01-29 (×2): 0.3 ug/kg/h via INTRAVENOUS
  Administered 2013-01-29: 0.7 ug/kg/h via INTRAVENOUS
  Administered 2013-01-29: 0.5 ug/kg/h via INTRAVENOUS
  Administered 2013-01-29 (×2): 0.7 ug/kg/h via INTRAVENOUS
  Filled 2013-01-28 (×7): qty 50

## 2013-01-28 MED ORDER — DEXTROSE 5 % IV SOLN
2.0000 g | Freq: Three times a day (TID) | INTRAVENOUS | Status: DC
  Filled 2013-01-28 (×2): qty 2

## 2013-01-28 MED ORDER — POTASSIUM CHLORIDE 2 MEQ/ML IV SOLN
80.0000 meq | INTRAVENOUS | Status: DC
  Filled 2013-01-28: qty 40

## 2013-01-28 MED ORDER — LACTATED RINGERS IV SOLN: INTRAVENOUS | Status: DC

## 2013-01-28 MED ORDER — NOREPINEPHRINE BITARTRATE 1 MG/ML IJ SOLN
5.0000 ug/min | INTRAMUSCULAR | Status: DC
  Filled 2013-01-28: qty 4

## 2013-01-28 MED ORDER — SODIUM CHLORIDE 0.9 % IV SOLN
INTRAVENOUS | Status: DC
  Filled 2013-01-28: qty 40

## 2013-01-28 MED ORDER — SODIUM CHLORIDE 0.9 % IJ SOLN: 3.0000 mL | INTRAMUSCULAR | Status: DC | PRN

## 2013-01-28 MED ORDER — PROTAMINE SULFATE 10 MG/ML IV SOLN
INTRAVENOUS | Status: DC | PRN
  Administered 2013-01-28: 380 mg via INTRAVENOUS

## 2013-01-28 MED ORDER — MILRINONE IN DEXTROSE 20 MG/100ML IV SOLN
0.1250 ug/kg/min | INTRAVENOUS | Status: AC
  Administered 2013-01-28: 0.25 ug/kg/min via INTRAVENOUS
  Filled 2013-01-28: qty 100

## 2013-01-28 MED ORDER — ACETAMINOPHEN 650 MG RE SUPP: 650.0000 mg | Freq: Once | RECTAL | Status: DC

## 2013-01-28 MED ORDER — PHENYLEPHRINE HCL 10 MG/ML IJ SOLN
30.0000 ug/min | INTRAVENOUS | Status: DC
  Filled 2013-01-28: qty 2

## 2013-01-28 MED ORDER — METOPROLOL TARTRATE 25 MG/10 ML ORAL SUSPENSION
12.5000 mg | Freq: Two times a day (BID) | ORAL | Status: DC
  Administered 2013-01-29 (×2): 12.5 mg
  Filled 2013-01-28 (×7): qty 5

## 2013-01-28 MED ORDER — FENTANYL CITRATE 0.05 MG/ML IJ SOLN
200.0000 ug | Freq: Once | INTRAMUSCULAR | Status: AC
  Administered 2013-01-28 (×2): 100 ug via INTRAVENOUS

## 2013-01-28 MED ORDER — BISACODYL 5 MG PO TBEC: 5.0000 mg | DELAYED_RELEASE_TABLET | Freq: Once | ORAL | Status: DC

## 2013-01-28 MED ORDER — ACETAMINOPHEN 160 MG/5ML PO SOLN
650.0000 mg | Freq: Once | ORAL | Status: DC
  Filled 2013-01-28: qty 20.3

## 2013-01-28 MED ORDER — FENTANYL CITRATE 0.05 MG/ML IJ SOLN
INTRAMUSCULAR | Status: AC
  Administered 2013-01-28: 100 ug
  Filled 2013-01-28: qty 2

## 2013-01-28 MED ORDER — VANCOMYCIN HCL 10 G IV SOLR
2000.0000 mg | INTRAVENOUS | Status: AC
  Administered 2013-01-28: 2000 mg via INTRAVENOUS
  Filled 2013-01-28: qty 2000

## 2013-01-28 MED ORDER — PHENYLEPHRINE HCL 10 MG/ML IJ SOLN
30.0000 ug/min | INTRAVENOUS | Status: AC
  Administered 2013-01-28: 50 ug/min via INTRAVENOUS
  Filled 2013-01-28: qty 2

## 2013-01-28 MED ORDER — SODIUM CHLORIDE 0.9 % IV SOLN
5.0000 g | INTRAVENOUS | Status: DC | PRN
  Administered 2013-01-28: 1 g/h via INTRAVENOUS
  Administered 2013-01-28: 5 g/h via INTRAVENOUS

## 2013-01-28 MED ORDER — MIDAZOLAM HCL 2 MG/2ML IJ SOLN
INTRAMUSCULAR | Status: AC
  Administered 2013-01-28: 2 mg via INTRAVENOUS
  Filled 2013-01-28: qty 2

## 2013-01-28 MED ORDER — INSULIN REGULAR BOLUS VIA INFUSION
0.0000 [IU] | Freq: Three times a day (TID) | INTRAVENOUS | Status: DC
  Administered 2013-01-29: 1.2 [IU] via INTRAVENOUS
  Filled 2013-01-28: qty 10

## 2013-01-28 MED ORDER — BISACODYL 10 MG RE SUPP: 10.0000 mg | Freq: Every day | RECTAL | Status: DC

## 2013-01-28 MED ORDER — HEPARIN SODIUM (PORCINE) 1000 UNIT/ML IJ SOLN
INTRAMUSCULAR | Status: DC | PRN
  Administered 2013-01-28: 5000 [IU] via INTRAVENOUS
  Administered 2013-01-28: 45000 [IU] via INTRAVENOUS
  Administered 2013-01-28: 10000 [IU] via INTRAVENOUS

## 2013-01-28 MED ORDER — SODIUM CHLORIDE 0.9 % IV BOLUS (SEPSIS)
1000.0000 mL | Freq: Once | INTRAVENOUS | Status: AC
  Administered 2013-01-28: 1000 mL via INTRAVENOUS

## 2013-01-28 MED ORDER — DEXTROSE 5 % IV SOLN
1.5000 g | INTRAVENOUS | Status: DC | PRN
  Administered 2013-01-28: 1.5 g via INTRAVENOUS

## 2013-01-28 MED ORDER — DEXMEDETOMIDINE HCL IN NACL 400 MCG/100ML IV SOLN: 0.1000 ug/kg/h | INTRAVENOUS | Status: DC

## 2013-01-28 MED ORDER — MAGNESIUM SULFATE 50 % IJ SOLN
40.0000 meq | INTRAMUSCULAR | Status: DC
  Filled 2013-01-28: qty 10

## 2013-01-28 MED ORDER — HEMOSTATIC AGENTS (NO CHARGE) OPTIME
TOPICAL | Status: DC | PRN
  Administered 2013-01-28: 2 via TOPICAL
  Administered 2013-01-28: 1 via TOPICAL

## 2013-01-28 MED ORDER — PERFLUTREN LIPID MICROSPHERE
1.0000 mL | INTRAVENOUS | Status: DC | PRN
  Administered 2013-01-28: 2 mL via INTRAVENOUS

## 2013-01-28 MED ORDER — SODIUM CHLORIDE 0.9 % IV SOLN
INTRAVENOUS | Status: DC
  Administered 2013-02-03: 21:00:00 via INTRAVENOUS

## 2013-01-28 MED ORDER — FENTANYL CITRATE 0.05 MG/ML IJ SOLN
INTRAMUSCULAR | Status: AC
  Administered 2013-01-28: 100 ug via INTRAVENOUS
  Filled 2013-01-28: qty 2

## 2013-01-28 MED ORDER — ESMOLOL HCL 10 MG/ML IV SOLN
INTRAVENOUS | Status: DC | PRN
  Administered 2013-01-28: 20 mg via INTRAVENOUS

## 2013-01-28 MED ORDER — FENTANYL CITRATE 0.05 MG/ML IJ SOLN
INTRAMUSCULAR | Status: DC | PRN
  Administered 2013-01-28 (×2): 500 ug via INTRAVENOUS
  Administered 2013-01-28 (×2): 250 ug via INTRAVENOUS
  Administered 2013-01-28: 500 ug via INTRAVENOUS
  Administered 2013-01-28: 250 ug via INTRAVENOUS

## 2013-01-28 MED ORDER — DOCUSATE SODIUM 100 MG PO CAPS
200.0000 mg | ORAL_CAPSULE | Freq: Every day | ORAL | Status: DC
  Administered 2013-01-30 – 2013-01-31 (×2): 200 mg via ORAL
  Filled 2013-01-28 (×2): qty 2

## 2013-01-28 MED ORDER — DEXTROSE 5 % IV SOLN
750.0000 mg | INTRAVENOUS | Status: DC
  Filled 2013-01-28: qty 750

## 2013-01-28 MED ORDER — PIPERACILLIN-TAZOBACTAM 3.375 G IVPB
3.3750 g | INTRAVENOUS | Status: DC
  Administered 2013-01-28: 3.375 g via INTRAVENOUS
  Filled 2013-01-28: qty 50

## 2013-01-28 MED ORDER — ASPIRIN 81 MG PO CHEW
324.0000 mg | CHEWABLE_TABLET | Freq: Every day | ORAL | Status: DC
  Administered 2013-01-29: 324 mg
  Filled 2013-01-28: qty 4

## 2013-01-28 MED ORDER — CEFUROXIME SODIUM 1.5 G IJ SOLR: 1.5000 g | Freq: Two times a day (BID) | INTRAMUSCULAR | Status: DC

## 2013-01-28 MED ORDER — MILRINONE IN DEXTROSE 20 MG/100ML IV SOLN: 0.3000 ug/kg/min | INTRAVENOUS | Status: DC

## 2013-01-28 MED ORDER — SODIUM CHLORIDE 0.45 % IV SOLN
INTRAVENOUS | Status: DC
  Administered 2013-01-28: 21:00:00 via INTRAVENOUS

## 2013-01-28 MED ORDER — MIDAZOLAM HCL 2 MG/2ML IJ SOLN
2.0000 mg | INTRAMUSCULAR | Status: DC | PRN
  Filled 2013-01-28: qty 2

## 2013-01-28 MED ORDER — LACTATED RINGERS IV SOLN: INTRAVENOUS | Status: DC | PRN

## 2013-01-28 MED ORDER — CALCIUM CHLORIDE 10 % IV SOLN
INTRAVENOUS | Status: DC | PRN
  Administered 2013-01-28: 100 mg via INTRAVENOUS
  Administered 2013-01-28: 500 mg via INTRAVENOUS
  Administered 2013-01-28 (×2): 100 mg via INTRAVENOUS

## 2013-01-28 MED ORDER — SODIUM CHLORIDE 0.9 % IV SOLN
INTRAVENOUS | Status: DC
  Filled 2013-01-28 (×2): qty 30

## 2013-01-28 MED ORDER — METOPROLOL TARTRATE 12.5 MG HALF TABLET
12.5000 mg | ORAL_TABLET | Freq: Two times a day (BID) | ORAL | Status: DC
Start: 2013-01-29 — End: 2013-01-31
  Administered 2013-01-30 – 2013-01-31 (×3): 12.5 mg via ORAL
  Filled 2013-01-28 (×7): qty 1

## 2013-01-28 MED ORDER — METOPROLOL TARTRATE 1 MG/ML IV SOLN
2.5000 mg | INTRAVENOUS | Status: DC | PRN
  Administered 2013-01-28: 2.5 mg via INTRAVENOUS

## 2013-01-28 MED ORDER — FENTANYL BOLUS VIA INFUSION
25.0000 ug | Freq: Four times a day (QID) | INTRAVENOUS | Status: DC | PRN
  Filled 2013-01-28: qty 100

## 2013-01-28 MED ORDER — DOPAMINE-DEXTROSE 3.2-5 MG/ML-% IV SOLN
0.0000 ug/kg/min | INTRAVENOUS | Status: DC
  Administered 2013-01-30: 3 ug/kg/min via INTRAVENOUS
  Filled 2013-01-28: qty 250

## 2013-01-28 MED ORDER — MORPHINE SULFATE 2 MG/ML IJ SOLN
2.0000 mg | INTRAMUSCULAR | Status: DC | PRN
  Administered 2013-01-29 – 2013-01-30 (×2): 2 mg via INTRAVENOUS
  Filled 2013-01-28: qty 3
  Filled 2013-01-28: qty 1

## 2013-01-28 MED ORDER — PLASMA-LYTE 148 IV SOLN
INTRAVENOUS | Status: AC
  Administered 2013-01-28: 15:00:00
  Filled 2013-01-28: qty 2.5

## 2013-01-28 MED ORDER — PROPOFOL 10 MG/ML IV EMUL
INTRAVENOUS | Status: AC
  Administered 2013-01-28: 100 mg via INTRAVENOUS
  Filled 2013-01-28: qty 100

## 2013-01-28 MED ORDER — ONDANSETRON HCL 4 MG/2ML IJ SOLN
4.0000 mg | Freq: Four times a day (QID) | INTRAMUSCULAR | Status: DC | PRN
  Filled 2013-01-28: qty 2

## 2013-01-28 MED ORDER — VANCOMYCIN HCL IN DEXTROSE 1-5 GM/200ML-% IV SOLN: 1000.0000 mg | Freq: Once | INTRAVENOUS | Status: DC

## 2013-01-28 MED ORDER — SODIUM CHLORIDE 0.9 % IV SOLN
INTRAVENOUS | Status: DC
  Administered 2013-01-28: 12:00:00 via INTRAVENOUS

## 2013-01-28 MED ORDER — SODIUM CHLORIDE 0.9 % IJ SOLN
3.0000 mL | Freq: Two times a day (BID) | INTRAMUSCULAR | Status: DC
  Administered 2013-01-29: 3 mL via INTRAVENOUS
  Administered 2013-01-30: 12:00:00 via INTRAVENOUS
  Administered 2013-01-30 – 2013-02-03 (×9): 3 mL via INTRAVENOUS

## 2013-01-28 MED ORDER — PROPOFOL 10 MG/ML IV BOLUS
100.0000 mg | Freq: Once | INTRAVENOUS | Status: DC
  Administered 2013-01-28: 100 mg via INTRAVENOUS

## 2013-01-28 MED ORDER — PANTOPRAZOLE SODIUM 40 MG PO TBEC
40.0000 mg | DELAYED_RELEASE_TABLET | Freq: Every day | ORAL | Status: DC
  Administered 2013-01-30 – 2013-02-04 (×6): 40 mg via ORAL
  Filled 2013-01-28 (×6): qty 1

## 2013-01-28 MED ORDER — HEPARIN SODIUM (PORCINE) 5000 UNIT/ML IJ SOLN
5000.0000 [IU] | Freq: Three times a day (TID) | INTRAMUSCULAR | Status: DC
  Filled 2013-01-28 (×4): qty 1

## 2013-01-28 MED ORDER — SODIUM CHLORIDE 0.9 % IJ SOLN
OROMUCOSAL | Status: DC | PRN
  Administered 2013-01-28: 15:00:00 via TOPICAL

## 2013-01-28 MED ORDER — PROPOFOL 10 MG/ML IV EMUL
5.0000 ug/kg/min | INTRAVENOUS | Status: DC
  Administered 2013-01-28: 10 ug/kg/min via INTRAVENOUS

## 2013-01-28 MED ORDER — VANCOMYCIN HCL IN DEXTROSE 1-5 GM/200ML-% IV SOLN
1000.0000 mg | Freq: Two times a day (BID) | INTRAVENOUS | Status: DC
  Filled 2013-01-28: qty 200

## 2013-01-28 MED ORDER — MAGNESIUM SULFATE 40 MG/ML IJ SOLN: 4.0000 g | Freq: Once | INTRAMUSCULAR | Status: DC

## 2013-01-28 MED ORDER — DOPAMINE-DEXTROSE 3.2-5 MG/ML-% IV SOLN: 2.0000 ug/kg/min | INTRAVENOUS | Status: DC

## 2013-01-28 MED ORDER — SODIUM CHLORIDE 0.9 % IV SOLN
1.0000 g/h | Freq: Once | INTRAVENOUS | Status: DC
  Filled 2013-01-28: qty 20

## 2013-01-28 MED ORDER — MIDAZOLAM BOLUS VIA INFUSION
1.0000 mg | INTRAVENOUS | Status: DC | PRN
  Filled 2013-01-28: qty 2

## 2013-01-28 MED ORDER — PANTOPRAZOLE SODIUM 40 MG IV SOLR
40.0000 mg | Freq: Every day | INTRAVENOUS | Status: DC
  Filled 2013-01-28: qty 40

## 2013-01-28 MED ORDER — SODIUM CHLORIDE 0.9 % IV SOLN
1.0000 mg/h | INTRAVENOUS | Status: DC
  Administered 2013-01-28: 2 mg/h via INTRAVENOUS
  Filled 2013-01-28 (×2): qty 10

## 2013-01-28 MED ORDER — ARTIFICIAL TEARS OP OINT
1.0000 "application " | TOPICAL_OINTMENT | Freq: Three times a day (TID) | OPHTHALMIC | Status: DC
  Administered 2013-01-28: 1 via OPHTHALMIC
  Filled 2013-01-28: qty 3.5

## 2013-01-28 MED ORDER — DOPAMINE-DEXTROSE 3.2-5 MG/ML-% IV SOLN
2.0000 ug/kg/min | INTRAVENOUS | Status: AC
  Administered 2013-01-28: 5 ug/kg/min via INTRAVENOUS
  Filled 2013-01-28: qty 250

## 2013-01-28 MED ORDER — ASPIRIN 81 MG PO CHEW
CHEWABLE_TABLET | ORAL | Status: AC
  Filled 2013-01-28: qty 4

## 2013-01-28 MED ORDER — PLASMA-LYTE 148 IV SOLN
INTRAVENOUS | Status: DC
  Filled 2013-01-28: qty 2.5

## 2013-01-28 MED ORDER — SODIUM CHLORIDE 0.9 % IV SOLN
INTRAVENOUS | Status: DC
  Filled 2013-01-28: qty 30

## 2013-01-28 MED ORDER — METRONIDAZOLE IN NACL 5-0.79 MG/ML-% IV SOLN
500.0000 mg | Freq: Four times a day (QID) | INTRAVENOUS | Status: DC
  Filled 2013-01-28 (×2): qty 100

## 2013-01-28 MED ORDER — VECURONIUM BROMIDE 10 MG IV SOLR
INTRAVENOUS | Status: DC | PRN
  Administered 2013-01-28 (×3): 10 mg via INTRAVENOUS

## 2013-01-28 MED ORDER — ALBUMIN HUMAN 5 % IV SOLN
250.0000 mL | INTRAVENOUS | Status: AC | PRN
  Administered 2013-01-28: 250 mL via INTRAVENOUS

## 2013-01-28 MED ORDER — ALBUMIN HUMAN 5 % IV SOLN
INTRAVENOUS | Status: DC | PRN
  Administered 2013-01-28: 15:00:00 via INTRAVENOUS

## 2013-01-28 MED ORDER — ASPIRIN EC 325 MG PO TBEC
325.0000 mg | DELAYED_RELEASE_TABLET | Freq: Every day | ORAL | Status: DC
  Administered 2013-01-30 – 2013-02-04 (×6): 325 mg via ORAL
  Filled 2013-01-28 (×7): qty 1

## 2013-01-28 MED ORDER — NITROGLYCERIN IN D5W 200-5 MCG/ML-% IV SOLN
2.0000 ug/min | INTRAVENOUS | Status: AC
  Administered 2013-01-28: 5 ug/min via INTRAVENOUS
  Filled 2013-01-28: qty 250

## 2013-01-28 MED ORDER — ROCURONIUM BROMIDE 50 MG/5ML IV SOLN
50.0000 mg | Freq: Once | INTRAVENOUS | Status: AC
  Administered 2013-01-28: 50 mg via INTRAVENOUS

## 2013-01-28 MED ORDER — 0.9 % SODIUM CHLORIDE (POUR BTL) OPTIME
TOPICAL | Status: DC | PRN
  Administered 2013-01-28: 6000 mL

## 2013-01-28 MED ORDER — LACTATED RINGERS IV SOLN
INTRAVENOUS | Status: DC | PRN
  Administered 2013-01-28 (×4): via INTRAVENOUS

## 2013-01-28 MED ORDER — CISATRACURIUM BOLUS VIA INFUSION
0.0500 mg/kg | Freq: Once | INTRAVENOUS | Status: AC
  Administered 2013-01-28: 5.5 mg via INTRAVENOUS
  Filled 2013-01-28: qty 6

## 2013-01-28 MED ORDER — PERFLUTREN LIPID MICROSPHERE
INTRAVENOUS | Status: AC
  Filled 2013-01-28: qty 10

## 2013-01-28 MED ORDER — PROPOFOL 10 MG/ML IV EMUL: 5.0000 ug/kg/min | INTRAVENOUS | Status: DC

## 2013-01-28 MED ORDER — ROCURONIUM BROMIDE 100 MG/10ML IV SOLN
INTRAVENOUS | Status: DC | PRN
  Administered 2013-01-28: 30 mg via INTRAVENOUS
  Administered 2013-01-28: 70 mg via INTRAVENOUS

## 2013-01-28 MED ORDER — NOREPINEPHRINE BITARTRATE 1 MG/ML IJ SOLN
2.0000 ug/min | INTRAVENOUS | Status: DC
  Administered 2013-01-28: 10 ug/min via INTRAVENOUS
  Filled 2013-01-28: qty 4

## 2013-01-28 MED ORDER — SODIUM CHLORIDE 0.9 % IV SOLN
INTRAVENOUS | Status: AC
  Administered 2013-01-28: 69 mL via INTRAVENOUS
  Administered 2013-01-28: 69 mL/h via INTRAVENOUS
  Filled 2013-01-28: qty 40

## 2013-01-28 MED ORDER — DEXTROSE 5 % IV SOLN
1.5000 g | INTRAVENOUS | Status: DC
  Filled 2013-01-28 (×2): qty 1.5

## 2013-01-28 MED ORDER — SODIUM CHLORIDE 0.9 % IJ SOLN
OROMUCOSAL | Status: DC | PRN
  Administered 2013-01-28: 19:00:00 via TOPICAL

## 2013-01-28 MED ORDER — METOPROLOL TARTRATE 12.5 MG HALF TABLET
12.5000 mg | ORAL_TABLET | Freq: Once | ORAL | Status: DC
  Filled 2013-01-28: qty 1

## 2013-01-28 MED ORDER — SODIUM BICARBONATE 8.4 % IV SOLN
INTRAVENOUS | Status: DC | PRN
  Administered 2013-01-28: 100 meq via INTRAVENOUS

## 2013-01-28 MED ORDER — MIDAZOLAM HCL 5 MG/5ML IJ SOLN
INTRAMUSCULAR | Status: DC | PRN
  Administered 2013-01-28: 2 mg via INTRAVENOUS
  Administered 2013-01-28: 3 mg via INTRAVENOUS
  Administered 2013-01-28: 5 mg via INTRAVENOUS
  Administered 2013-01-28: 4 mg via INTRAVENOUS
  Administered 2013-01-28 (×2): 5 mg via INTRAVENOUS

## 2013-01-28 MED ORDER — SODIUM CHLORIDE 0.9 % IV SOLN
25.0000 ug/h | INTRAVENOUS | Status: DC
  Administered 2013-01-28: 50 ug/h via INTRAVENOUS
  Filled 2013-01-28: qty 50

## 2013-01-28 MED ORDER — FLUCONAZOLE IN SODIUM CHLORIDE 200-0.9 MG/100ML-% IV SOLN
200.0000 mg | Freq: Once | INTRAVENOUS | Status: AC
  Administered 2013-01-28: 200 mg via INTRAVENOUS
  Filled 2013-01-28: qty 100

## 2013-01-28 MED ORDER — FLUCONAZOLE IN SODIUM CHLORIDE 400-0.9 MG/200ML-% IV SOLN
400.0000 mg | INTRAVENOUS | Status: DC
  Administered 2013-01-29 – 2013-02-02 (×5): 400 mg via INTRAVENOUS
  Filled 2013-01-28 (×5): qty 200

## 2013-01-28 MED ORDER — MORPHINE SULFATE 2 MG/ML IJ SOLN: 1.0000 mg | INTRAMUSCULAR | Status: AC | PRN

## 2013-01-28 MED ORDER — ACETAMINOPHEN 500 MG PO TABS
1000.0000 mg | ORAL_TABLET | Freq: Four times a day (QID) | ORAL | Status: AC
  Administered 2013-01-30 – 2013-02-02 (×14): 1000 mg via ORAL
  Filled 2013-01-28 (×20): qty 2

## 2013-01-28 MED ORDER — SODIUM CHLORIDE 0.9 % IV BOLUS (SEPSIS): 1000.0000 mL | INTRAVENOUS | Status: DC | PRN

## 2013-01-28 MED ORDER — HEMOSTATIC AGENTS (NO CHARGE) OPTIME
TOPICAL | Status: DC | PRN
  Administered 2013-01-28 (×2): 1 via TOPICAL

## 2013-01-28 MED ORDER — NITROGLYCERIN IN D5W 200-5 MCG/ML-% IV SOLN: 2.0000 ug/min | INTRAVENOUS | Status: DC

## 2013-01-28 MED ORDER — CEFUROXIME SODIUM 1.5 G IJ SOLR: 1.5000 g | INTRAMUSCULAR | Status: DC

## 2013-01-28 MED ORDER — DEXMEDETOMIDINE HCL IN NACL 400 MCG/100ML IV SOLN
0.1000 ug/kg/h | INTRAVENOUS | Status: DC
  Administered 2013-01-28: 0.2 ug/kg/h via INTRAVENOUS
  Filled 2013-01-28: qty 100

## 2013-01-28 MED ORDER — SODIUM CHLORIDE 0.9 % IV SOLN: 250.0000 mL | INTRAVENOUS | Status: DC

## 2013-01-28 MED ORDER — OXYCODONE HCL 5 MG PO TABS: 5.0000 mg | ORAL_TABLET | ORAL | Status: DC | PRN

## 2013-01-28 MED ORDER — FAMOTIDINE IN NACL 20-0.9 MG/50ML-% IV SOLN
20.0000 mg | Freq: Two times a day (BID) | INTRAVENOUS | Status: AC
  Administered 2013-01-29 (×2): 20 mg via INTRAVENOUS
  Filled 2013-01-28 (×2): qty 50

## 2013-01-28 MED ORDER — VANCOMYCIN HCL 10 G IV SOLR
1500.0000 mg | INTRAVENOUS | Status: AC
  Administered 2013-01-28: 1500 mg via INTRAVENOUS
  Filled 2013-01-28: qty 1500

## 2013-01-28 MED ORDER — VANCOMYCIN HCL 10 G IV SOLR: 1250.0000 mg | INTRAVENOUS | Status: DC

## 2013-01-28 MED ORDER — NOREPINEPHRINE BITARTRATE 1 MG/ML IJ SOLN
0.0000 ug/min | INTRAVENOUS | Status: DC
  Administered 2013-01-29: 5 ug/min via INTRAVENOUS
  Filled 2013-01-28: qty 8

## 2013-01-28 MED ORDER — SODIUM CHLORIDE 0.9 % IV SOLN
500.0000 mg | Freq: Once | INTRAVENOUS | Status: AC
  Administered 2013-01-28: 500 mg via INTRAVENOUS
  Filled 2013-01-28: qty 500

## 2013-01-28 MED ORDER — LACTATED RINGERS IV SOLN: 500.0000 mL | Freq: Once | INTRAVENOUS | Status: AC | PRN

## 2013-01-28 SURGICAL SUPPLY — 121 items
APL SKNCLS STERI-STRIP NONHPOA (GAUZE/BANDAGES/DRESSINGS) ×2
ATTRACTOMAT 16X20 MAGNETIC DRP (DRAPES) ×3 IMPLANT
BAG DECANTER FOR FLEXI CONT (MISCELLANEOUS) ×3 IMPLANT
BANDAGE ELASTIC 4 VELCRO ST LF (GAUZE/BANDAGES/DRESSINGS) ×3 IMPLANT
BANDAGE ELASTIC 6 VELCRO ST LF (GAUZE/BANDAGES/DRESSINGS) ×3 IMPLANT
BANDAGE GAUZE ELAST BULKY 4 IN (GAUZE/BANDAGES/DRESSINGS) ×3 IMPLANT
BASKET HEART (ORDER IN 25'S) (MISCELLANEOUS) ×1
BASKET HEART (ORDER IN 25S) (MISCELLANEOUS) ×2 IMPLANT
BENZOIN TINCTURE PRP APPL 2/3 (GAUZE/BANDAGES/DRESSINGS) ×3 IMPLANT
BLADE STERNUM SYSTEM 6 (BLADE) ×3 IMPLANT
CANISTER SUCTION 2500CC (MISCELLANEOUS) ×6 IMPLANT
CANNULA EZ GLIDE AORTIC 21FR (CANNULA) ×3 IMPLANT
CANNULA GUNDRY RCSP 15FR (MISCELLANEOUS) ×1 IMPLANT
CANNULA OPTISITE PERFUSION 18F (CANNULA) ×1 IMPLANT
CANNULA OPTISITE PERFUSION 20F (CANNULA) ×1 IMPLANT
CANNULA VENOUS LOW PROF 34X46 (CANNULA) IMPLANT
CATH CPB KIT HENDRICKSON (MISCELLANEOUS) ×3 IMPLANT
CATH HEART VENT LEFT (CATHETERS) IMPLANT
CATH ROBINSON RED A/P 18FR (CATHETERS) ×4 IMPLANT
CATH THORACIC 28FR (CATHETERS) ×3 IMPLANT
CATH THORACIC 28FR RT ANG (CATHETERS) IMPLANT
CATH THORACIC 36FR (CATHETERS) ×6 IMPLANT
CATH THORACIC 36FR RT ANG (CATHETERS) ×3 IMPLANT
CLIP TI MEDIUM 24 (CLIP) IMPLANT
CLIP TI WIDE RED SMALL 24 (CLIP) IMPLANT
CONT SPEC 4OZ CLIKSEAL STRL BL (MISCELLANEOUS) ×5 IMPLANT
COUNTER NEEDLE 20 DBL MAG RED (NEEDLE) ×1 IMPLANT
COVER MAYO STAND STRL (DRAPES) ×1 IMPLANT
COVER SURGICAL LIGHT HANDLE (MISCELLANEOUS) ×9 IMPLANT
CRADLE DONUT ADULT HEAD (MISCELLANEOUS) ×3 IMPLANT
DRAIN CHANNEL 28F RND 3/8 FF (WOUND CARE) IMPLANT
DRAIN CHANNEL 32F RND 10.7 FF (WOUND CARE) ×1 IMPLANT
DRAPE CARDIOVASCULAR INCISE (DRAPES) ×3
DRAPE LAPAROSCOPIC ABDOMINAL (DRAPES) ×3 IMPLANT
DRAPE SLUSH/WARMER DISC (DRAPES) ×3 IMPLANT
DRAPE SRG 135X102X78XABS (DRAPES) ×2 IMPLANT
DRAPE WARM FLUID 44X44 (DRAPE) IMPLANT
DRSG COVADERM 4X14 (GAUZE/BANDAGES/DRESSINGS) ×3 IMPLANT
ELECT REM PT RETURN 9FT ADLT (ELECTROSURGICAL) ×9
ELECTRODE REM PT RTRN 9FT ADLT (ELECTROSURGICAL) ×6 IMPLANT
FELT TEFLON 6X6 (MISCELLANEOUS) ×1 IMPLANT
FEMORAL VENOUS CANN RAP (CANNULA) ×1 IMPLANT
GAUZE SPONGE 4X4 16PLY XRAY LF (GAUZE/BANDAGES/DRESSINGS) ×3 IMPLANT
GLOVE BIO SURGEON STRL SZ 6.5 (GLOVE) ×5 IMPLANT
GLOVE BIOGEL PI IND STRL 6.5 (GLOVE) IMPLANT
GLOVE BIOGEL PI INDICATOR 6.5 (GLOVE) ×6
GLOVE EUDERMIC 7 POWDERFREE (GLOVE) ×15 IMPLANT
GOWN PREVENTION PLUS XLARGE (GOWN DISPOSABLE) ×11 IMPLANT
GOWN STRL NON-REIN LRG LVL3 (GOWN DISPOSABLE) ×16 IMPLANT
HEMOSTAT POWDER SURGIFOAM 1G (HEMOSTASIS) ×10 IMPLANT
HEMOSTAT SURGICEL 2X14 (HEMOSTASIS) ×5 IMPLANT
INSERT FOGARTY XLG (MISCELLANEOUS) ×1 IMPLANT
KIT BASIN OR (CUSTOM PROCEDURE TRAY) ×6 IMPLANT
KIT DILATOR VASC 18G NDL (KITS) ×3 IMPLANT
KIT ROOM TURNOVER OR (KITS) ×6 IMPLANT
KIT SUCTION CATH 14FR (SUCTIONS) ×9 IMPLANT
KIT VASOVIEW W/TROCAR VH 2000 (KITS) ×3 IMPLANT
MARKER GRAFT CORONARY BYPASS (MISCELLANEOUS) ×9 IMPLANT
NS IRRIG 1000ML POUR BTL (IV SOLUTION) ×24 IMPLANT
PACK CHEST (CUSTOM PROCEDURE TRAY) ×3 IMPLANT
PACK OPEN HEART (CUSTOM PROCEDURE TRAY) ×3 IMPLANT
PAD ARMBOARD 7.5X6 YLW CONV (MISCELLANEOUS) ×12 IMPLANT
PAD ELECT DEFIB RADIOL ZOLL (MISCELLANEOUS) ×6 IMPLANT
PATCH CORMATRIX 4CMX7CM (Prosthesis & Implant Heart) ×1 IMPLANT
PENCIL BUTTON HOLSTER BLD 10FT (ELECTRODE) ×3 IMPLANT
PUNCH AORTIC ROTATE 4.0MM (MISCELLANEOUS) IMPLANT
PUNCH AORTIC ROTATE 4.5MM 8IN (MISCELLANEOUS) ×1 IMPLANT
PUNCH AORTIC ROTATE 5MM 8IN (MISCELLANEOUS) IMPLANT
SPONGE GAUZE 4X4 12PLY (GAUZE/BANDAGES/DRESSINGS) ×10 IMPLANT
SPONGE INTESTINAL PEANUT (DISPOSABLE) ×1 IMPLANT
SPONGE LAP 18X18 X RAY DECT (DISPOSABLE) ×3 IMPLANT
STAPLER VISISTAT 35W (STAPLE) IMPLANT
STRIP CLOSURE SKIN 1/2X4 (GAUZE/BANDAGES/DRESSINGS) ×3 IMPLANT
SUCKER INTRACARDIAC WEIGHTED (SUCKER) ×1 IMPLANT
SUT BONE WAX W31G (SUTURE) ×3 IMPLANT
SUT ETHIBOND 2 0 SH (SUTURE) ×18
SUT ETHIBOND 2 0 SH 36X2 (SUTURE) IMPLANT
SUT ETHIBOND NAB MH 2-0 36IN (SUTURE) ×22 IMPLANT
SUT MNCRL AB 4-0 PS2 18 (SUTURE) IMPLANT
SUT PROLENE 3 0 SH DA (SUTURE) ×4 IMPLANT
SUT PROLENE 4 0 RB 1 (SUTURE) ×12
SUT PROLENE 4 0 SH DA (SUTURE) IMPLANT
SUT PROLENE 4-0 RB1 .5 CRCL 36 (SUTURE) IMPLANT
SUT PROLENE 5 0 C 1 36 (SUTURE) ×2 IMPLANT
SUT PROLENE 6 0 C 1 30 (SUTURE) ×6 IMPLANT
SUT PROLENE 7 0 BV1 MDA (SUTURE) ×3 IMPLANT
SUT PROLENE 8 0 BV175 6 (SUTURE) IMPLANT
SUT SILK  1 MH (SUTURE) ×1
SUT SILK 1 MH (SUTURE) IMPLANT
SUT STEEL 6MS V (SUTURE) IMPLANT
SUT STEEL STERNAL CCS#1 18IN (SUTURE) ×1 IMPLANT
SUT STEEL SZ 6 DBL 3X14 BALL (SUTURE) ×1 IMPLANT
SUT VIC AB 1 CTX 36 (SUTURE) ×9
SUT VIC AB 1 CTX36XBRD ANBCTR (SUTURE) ×6 IMPLANT
SUT VIC AB 2-0 CT1 27 (SUTURE)
SUT VIC AB 2-0 CT1 TAPERPNT 27 (SUTURE) IMPLANT
SUT VIC AB 2-0 CTX 27 (SUTURE) ×1 IMPLANT
SUT VIC AB 2-0 CTX 36 (SUTURE) ×3 IMPLANT
SUT VIC AB 3-0 SH 27 (SUTURE)
SUT VIC AB 3-0 SH 27X BRD (SUTURE) IMPLANT
SUT VIC AB 3-0 X1 27 (SUTURE) ×4 IMPLANT
SUT VICRYL 4-0 PS2 18IN ABS (SUTURE) IMPLANT
SUTURE E-PAK OPEN HEART (SUTURE) ×3 IMPLANT
SWAB COLLECTION DEVICE MRSA (MISCELLANEOUS) ×1 IMPLANT
SYR 30ML SLIP (SYRINGE) IMPLANT
SYRINGE 10CC LL (SYRINGE) IMPLANT
SYSTEM SAHARA CHEST DRAIN ATS (WOUND CARE) ×6 IMPLANT
TAPE CLOTH SURG 4X10 WHT LF (GAUZE/BANDAGES/DRESSINGS) ×1 IMPLANT
TAPE PAPER 2X10 WHT MICROPORE (GAUZE/BANDAGES/DRESSINGS) ×1 IMPLANT
TOWEL OR 17X24 6PK STRL BLUE (TOWEL DISPOSABLE) ×10 IMPLANT
TOWEL OR 17X26 10 PK STRL BLUE (TOWEL DISPOSABLE) ×12 IMPLANT
TRAP SPECIMEN MUCOUS 40CC (MISCELLANEOUS) IMPLANT
TRAY FOLEY CATH 14FRSI W/METER (CATHETERS) ×2 IMPLANT
TRAY FOLEY IC TEMP SENS 14FR (CATHETERS) ×3 IMPLANT
TUBE ANAEROBIC SPECIMEN COL (MISCELLANEOUS) ×1 IMPLANT
TUBE CONNECTING 12X1/4 (SUCTIONS) ×2 IMPLANT
TUBE FEEDING 8FR 16IN STR KANG (MISCELLANEOUS) ×3 IMPLANT
TUBING INSUFFLATION 10FT LAP (TUBING) ×3 IMPLANT
UNDERPAD 30X30 INCONTINENT (UNDERPADS AND DIAPERS) ×3 IMPLANT
VENT LEFT HEART 12002 (CATHETERS) ×3
WATER STERILE IRR 1000ML POUR (IV SOLUTION) ×12 IMPLANT

## 2013-01-28 NOTE — Procedures (Signed)
Arterial Catheter Insertion Procedure Note [name] [id number] 10-May-1960  Procedure: Insertion of Arterial Catheter  Indications: Blood pressure monitoring and Frequent blood sampling  Procedure Details Consent: Unable to obtain consent because of emergent medical necessity. Time Out: Verified patient identification, verified procedure, site/side was marked, verified correct patient position, special equipment/implants available, medications/allergies/relevent history reviewed, required imaging and test results available.  Performed  Maximum sterile technique was used including antiseptics, cap, gloves, gown, hand hygiene, mask and sheet. Skin prep: Chlorhexidine; local anesthetic administered 20 gauge catheter was inserted into right femoral artery using the Seldinger technique.  Evaluation Blood flow good; BP tracing good. Complications: No apparent complications.   Nelda Bucks 01/28/2013  [place] Shock Emergent Limited radials  Mcarthur Rossetti. [name], MD, FACP Pgr: [phone] [facility]

## 2013-01-28 NOTE — H&P (Signed)
[name] is an 52 y.o. male.  Plan: Surgical drainage of pericardial fluid Start vancomycin and ceftriaxone for 2-4 weeks Continue monitoring vitals Wait for blood culture  Assessment: Recurrent tamponade sepsis  HPI  The patient,a 52 year old male, was unresponsive and thus unable to provide any history. He presented today with acute left sided weakness, sweating, hypotension (down to 96/64 yesterday) and a fever (as high as 105 this morning) for which he was shifted to 2-Heart and intubated and given IV fluids/pressors. He was admitted to the ward on 09/22, diagnosed as NSTEMI, after which he developed pericarditis and cardiac tamponade. He underwent pericardiocentesis on 10/10 and the drain was removed yesterday. EKG showed new Q waves in inferior lead. Echo showed lobulated 2 cm pericardial effusion around left ventricle  Hospital  Chest pain  Acute pericarditis, unspecified  Fever  Pericardial tamponade  CVA (cerebral infarction)  Non-Hospital  Non-STEMI (non-ST elevated myocardial infarction)  Hypokalemia   Social history: former smoker for 35 pack-years Allergies: none    Review of Systems  Unable to perform ROS: medical condition    Blood pressure 106/68, pulse 116, temperature 102.6 F (39.2 C), temperature source Oral, resp. rate 18, height 6\' 1"  (1.854 m), weight 110.451 kg (243 lb 8 oz), SpO2 100.00%. Physical Exam  Cardiovascular: Normal rate, regular rhythm and normal heart sounds.   Respiratory: He has wheezes. He has rales.  GI: Soft.  Skin: He is diaphoretic.  Neuro: not alert or oriented. No muscle tone         Body fluid culture [[id number]] Collected: 01/24/13 1020   Order Status: Completed  Updated: 01/27/13 1026    Specimen Description PERICARDIAL FLUID     Special Requests NONE     Gram Stain -     Result:     ABUNDANT WBC PRESENT, PREDOMINANTLY PMN NO ORGANISMS SEEN Performed at [facility]    Culture -     Result:     NO GROWTH 2 DAYS Performed at [facility]    Report Status PENDING    MRSA PCR Screening [[id number]] Collected: 01/21/13 1657   Order Status: Completed  Updated: 01/21/13 1837   Specimen Information: Nasopharyngeal     MRSA by PCR NEGATIVE     Comment:      The GeneXpert MRSA Assay (FDA approved for NASAL specimens only), is one component of a comprehensive MRSA colonization surveillance program. It is not intended to diagnose MRSA infection nor to guide or monitor treatment for MRSA infections.   CXR 10/15  IMPRESSION:  1. Right IJ central line placement without complicating feature.  2. Central pulmonary vascular congestion versus vascular crowding.  3. Question developing left lower lobe airspace disease.   CT head 10/15 IMPRESSION:  No acute abnormality. Old left cerebellar infarct. Chronic small  vessel ischemic changes.  10/11 echo Study Conclusions  - Left ventricle: Systolic function was normal. The estimated ejection fraction was in the range of 55% to 60%. - Pericardium, extracardiac: A moderate, free-flowing pericardial effusion was identified circumferential to the heart, predominantly anterior.The fluid contained focal strands. The possibility of hemodynamic compromise cannot be excluded on the basis of available images.  Echo 10/12 Recommendations: This is a limited study for the evaluation of of the pericardial effusion post pericardiocentesis yesterday. The ampunt of pericardial effusion is significantly decreased and is now mild. The effusion is located anteriorly around the RV free wall. Maximum diameter in diastole is 10 mm. There is no collapse  of the right ventricle in diastole. IVC is dilated with less than 50% respiratory variations. There are no signs of tamponade.  There is akinesis of the basal and mid inferior and inferoseptal walls and apical inferior wall. Overall left ventricular ejection fraction is 40-45 %.  10/14 0500   10/15 0440  10/15 1059  WBC 10.5 11.5 RBC 3.92 3.58 Hemoglobin 11.1 10.2 HCT 31.8 29.1 Platelets 361 392 Sodium 134 132 Potassium 4.1 3.9 Chloride 101 98 CO2 20 22 BUN 21 26 Creatinine 0.88 1.16 Calcium 7.8 8.2 INR 1.36 Glucose 114 119   UCS: moderate bilirubin, 15 ketones, 30 proteins, nitrite +ve, trace WBC   Lux Meaders 01/28/2013, 11:29 AM

## 2013-01-28 NOTE — Progress Notes (Signed)
Tracheal Aspirate obtained/labelled/sent to lab. 

## 2013-01-28 NOTE — Progress Notes (Signed)
Patient was taken to the OR by CRNA and other OR staff.  Bedside report was given to the CRNA.

## 2013-01-28 NOTE — OR Nursing (Signed)
18:30 - 1st call to SICU, 19:30 - 2nd call to SICU.

## 2013-01-28 NOTE — Consult Note (Addendum)
Referring Physician: Clifton James    Chief Complaint: Left arm weakness and nubmness  HPI: [name] is an 52 y.o. male with a history of CAD s/p NSTEMI on 9/23 and admission 10/8 with recurrent chest pain secondary to acute pericarditis and cardiac tamponade (Dressler syndrome).  Patient had a drain placed that was removed on the 14th due to decreased drainage.  Patient has been doing well other than periods of hypotension until this morning after using the bathroom.  At that time complained of being unable to use his left hand.  Patient went to head CT and on return complained of difficulty seeing to the left as well.     Date last known well: Date: 01/28/2013 Time last known well: Time: 05:45 tPA Given: No: Patient with recent pericarditis and drainage  Past Medical History  Diagnosis Date  . Coronary artery disease     a. s/p MI in 2009 in [place] with stenting of the LCX and LAD;  b. 12/2012 NSTEMI/CAD: LM nl, LAD patent prox stent, LCX 50-70 isr (FFR 0.84), RCA dom, 175m, EF 40-45%, sev basal to mid inf HK to AK-->Med Rx.  . Tobacco abuse     a. quit 12/2012.  . Ischemic cardiomyopathy     a. 12/2012 Ech: EF 40-45%, Gr 1 DD.  Marland Kitchen Hyperlipidemia     Past Surgical History  Procedure Laterality Date  . Coronary angioplasty with stent placement  2000's X 2    "1 + 1" (01/06/2013)  . Cardiac catheterization  01/06/2013    Family History  Problem Relation Age of Onset  . CAD     Social History:  reports that he quit smoking about 3 weeks ago. His smoking use included Cigarettes. He has a 35 pack-year smoking history. He has never used smokeless tobacco. He reports that he does not drink alcohol or use illicit drugs.  Allergies: No Known Allergies  Medications:  I have reviewed the patient's current medications. Prior to Admission:  Prescriptions prior to admission  Medication Sig Dispense Refill  . aspirin EC 81 MG EC tablet Take 1 tablet (81 mg total) by mouth daily.  30 tablet  0   . clopidogrel (PLAVIX) 75 MG tablet Take 1 tablet (75 mg total) by mouth daily.  30 tablet  11  . lisinopril (PRINIVIL,ZESTRIL) 5 MG tablet Take 1 tablet (5 mg total) by mouth daily.  30 tablet  11  . metoprolol tartrate (LOPRESSOR) 25 MG tablet Take 1 tablet (25 mg total) by mouth 2 (two) times daily.  60 tablet  11  . nitroGLYCERIN (NITROSTAT) 0.4 MG SL tablet Place 1 tablet (0.4 mg total) under the tongue every 5 (five) minutes as needed for chest pain.  25 tablet  3  . simvastatin (ZOCOR) 40 MG tablet Take 1 tablet (40 mg total) by mouth at bedtime.  30 tablet  11   Scheduled: . aspirin EC  81 mg Oral Daily  . clopidogrel  75 mg Oral Daily  . colchicine  0.6 mg Oral BID  . diazepam  5 mg Oral Once  . indomethacin  50 mg Oral TID WC  . metoprolol tartrate  25 mg Oral BID  . pantoprazole  40 mg Oral BID  . simvastatin  40 mg Oral QHS  . sodium chloride  3 mL Intravenous Q12H    ROS: History obtained from the patient  General ROS: negative for - chills, fatigue, fever, night sweats, weight gain or weight loss Psychological ROS: negative for -  behavioral disorder, hallucinations, memory difficulties, mood swings or suicidal ideation Ophthalmic ROS: negative for - blurry vision, double vision, eye pain or loss of vision ENT ROS: negative for - epistaxis, nasal discharge, oral lesions, sore throat, tinnitus or vertigo Allergy and Immunology ROS: negative for - hives or itchy/watery eyes Hematological and Lymphatic ROS: negative for - bleeding problems, bruising or swollen lymph nodes Endocrine ROS: negative for - galactorrhea, hair pattern changes, polydipsia/polyuria or temperature intolerance Respiratory ROS: negative for - cough, hemoptysis, shortness of breath or wheezing Cardiovascular ROS: chest pain Gastrointestinal ROS: negative for - abdominal pain, diarrhea, hematemesis, nausea/vomiting or stool incontinence Genito-Urinary ROS: negative for - dysuria, hematuria, incontinence  or urinary frequency/urgency Musculoskeletal ROS: negative for - joint swelling or muscular weakness Neurological ROS: as noted in HPI Dermatological ROS: negative for rash and skin lesion changes  Physical Examination: Blood pressure 117/69, pulse 118, temperature 98.5 F (36.9 C), temperature source Oral, resp. rate 18, height 6\' 1"  (1.854 m), weight 110.451 kg (243 lb 8 oz), SpO2 88.00%.  Neurologic Examination: Mental Status: Alert, oriented, thought content appropriate.  Speech fluent without evidence of aphasia.  Able to follow 3 step commands without difficulty. Cranial Nerves: II: Discs flat bilaterally; Blurred vision in the left visual field, pupils equal, round, reactive to light and accommodation III,IV, VI: ptosis not present, extra-ocular motions intact bilaterally V,VII: smile symmetric, facial light touch sensation normal bilaterally VIII: hearing normal bilaterally IX,X: gag reflex present XI: bilateral shoulder shrug XII: midline tongue extension Motor: Right : Upper extremity   5/5    Left:     Upper extremity   4/5  Lower extremity   5/5     Lower extremity   5/5 Tone and bulk:normal tone throughout; no atrophy noted Sensory: Pinprick and light touch decreased in the left upper extremity distally Deep Tendon Reflexes: Symmetric throughout Plantars: Right: downgoing   Left: downgoing Cerebellar: normal finger-to-nose and normal heel-to-shin test Gait: Unable to test CV: pulses palpable throughout   Laboratory Studies:  Basic Metabolic Panel:  Recent Labs Lab 01/24/13 0613 01/25/13 0408 01/26/13 0355 01/27/13 0500 01/28/13 0440  NA 132* 134* 131* 134* 132*  K 3.9 3.8 3.5 4.1 3.9  CL 99 103 100 101 98  CO2 20 20 21 20 22   GLUCOSE 152* 119* 97 114* 119*  BUN 34* 30* 21 21 26*  CREATININE 1.20 0.83 0.74 0.88 1.16  CALCIUM 7.5* 7.2* 7.3* 7.8* 8.2*    Liver Function Tests:  Recent Labs Lab 01/22/13 0610  AST 34  ALT 24  ALKPHOS 156*  BILITOT  0.8  PROT 7.0  ALBUMIN 2.6*   No results found for this basename: LIPASE, AMYLASE,  in the last 168 hours No results found for this basename: AMMONIA,  in the last 168 hours  CBC:  Recent Labs Lab 01/24/13 0613 01/25/13 0408 01/26/13 0355 01/27/13 0500 01/28/13 0440  WBC 17.1* 12.9* 10.5 10.5 11.5*  HGB 11.9* 11.3* 11.1* 11.1* 10.2*  HCT 33.8* 31.7* 31.1* 31.8* 29.1*  MCV 80.9 80.5 81.2 81.1 81.3  PLT 201 225 288 361 392    Cardiac Enzymes:  Recent Labs Lab 01/21/13 1642 01/21/13 2215 01/22/13 0610  TROPONINI 0.90* 1.09* 1.37*    BNP: No components found with this basename: POCBNP,   CBG:  Recent Labs Lab 01/28/13 0554  GLUCAP 118*    Microbiology: Results for orders placed during the hospital encounter of 01/21/13  MRSA PCR SCREENING     Status: None   Collection  Time    01/21/13  4:57 PM      Result Value Range Status   MRSA by PCR NEGATIVE  NEGATIVE Final   Comment:            The GeneXpert MRSA Assay (FDA     approved for NASAL specimens     only), is one component of a     comprehensive MRSA colonization     surveillance program. It is not     intended to diagnose MRSA     infection nor to guide or     monitor treatment for     MRSA infections.  BODY FLUID CULTURE     Status: None   Collection Time    01/24/13 10:20 AM      Result Value Range Status   Specimen Description PERICARDIAL FLUID   Final   Special Requests NONE   Final   Gram Stain     Final   Value: ABUNDANT WBC PRESENT, PREDOMINANTLY PMN     NO ORGANISMS SEEN     Performed at [facility]   Culture     Final   Value: NO GROWTH 2 DAYS     Performed at [facility]   Report Status PENDING   Incomplete    Coagulation Studies: No results found for this basename: LABPROT, INR,  in the last 72 hours  Urinalysis:   Recent Labs Lab 01/22/13 1132  COLORURINE AMBER*  LABSPEC 1.034*  PHURINE 5.5  GLUCOSEU NEGATIVE  HGBUR NEGATIVE  BILIRUBINUR SMALL*   KETONESUR 15*  PROTEINUR 30*  UROBILINOGEN 4.0*  NITRITE NEGATIVE  LEUKOCYTESUR NEGATIVE    Lipid Panel:    Component Value Date/Time   CHOL 164 01/06/2013 0511   TRIG 109 01/06/2013 0511   HDL 44 01/06/2013 0511   CHOLHDL 3.7 01/06/2013 0511   VLDL 22 01/06/2013 0511   LDLCALC 98 01/06/2013 0511    HgbA1C:  Lab Results  Component Value Date   HGBA1C 5.7* 01/05/2013    Urine Drug Screen:     Component Value Date/Time   LABOPIA NONE DETECTED 01/05/2013 2053   COCAINSCRNUR NONE DETECTED 01/05/2013 2053   LABBENZ NONE DETECTED 01/05/2013 2053   AMPHETMU NONE DETECTED 01/05/2013 2053   THCU NONE DETECTED 01/05/2013 2053   LABBARB NONE DETECTED 01/05/2013 2053    Alcohol Level: No results found for this basename: ETH,  in the last 168 hours  Other results: EKG: sinus tachycardia at 117bpm  Imaging: Ct Head Wo Contrast  01/28/2013   CLINICAL DATA:  Code stroke.  Weakness.  EXAM: CT HEAD WITHOUT CONTRAST  TECHNIQUE: Contiguous axial images were obtained from the base of the skull through the vertex without intravenous contrast.  COMPARISON:  None.  FINDINGS: No mass lesion. No midline shift. No acute hemorrhage or hematoma. No extra-axial fluid collections. No evidence of acute infarction.  There is an old left cerebellar hemisphere infarct. There are a few tiny vague periventricular white matter lucencies most likely representing chronic small vessel ischemic changes.  Osseous structures are normal.  IMPRESSION: No acute abnormality. Old left cerebellar infarct. Chronic small vessel ischemic changes.  Critical Value/emergent results were called by telephone at the time of interpretation on 01/28/2013 at 7:08 AM to [facility], RN, who verbally acknowledged these results.   Electronically Signed   By: Geanie Cooley M.D.   On: 01/28/2013 07:09    Assessment: 52 y.o. male admitted with pericarditis and cardiac tamponade s/p drain who this morning  developed left arm weakness and numbness and left  visual field deficits.  CT of head reviewed and shows no acute changes.  Patient discussed with cardiology and due to size of recent pericarditis they are concerned about the use of tPA and therefore patient is not a tPA candidate.  Will rule out appropriateness of thrombectomy.    Recent lipid panel and A1c performed on 01/05/2013 are unremarkable.    Stroke Risk Factors - hyperlipidemia and smoking  Plan: 1. CT angiogram of the head and neck 2. Echocardiogram 3. Prophylactic therapy-Continue ASA and Plavix 4. Telemetry monitoring 5. Frequent neuro checks 6. MRI of the brain without contrast   Thana Farr, MD [facility] [phone] 01/28/2013, 7:15 AM

## 2013-01-28 NOTE — Progress Notes (Signed)
Stroke Team Progress Note  HISTORY [name] is an 52 y.o. male with a history of CAD s/p NSTEMI on 9/23 and admission 10/8 with recurrent chest pain secondary to acute pericarditis and cardiac tamponade (Dressler syndrome). Patient had a drain placed that was removed on the 14th due to decreased drainage. Patient has been doing well other than periods of hypotension until this morning after using the bathroom. At that time complained of being unable to use his left hand. Patient went to head CT and on return complained of difficulty seeing to the left as well.   Date last known well: Date: 01/28/2013  Time last known well: Time: 05:45  tPA Given: No: Patient with recent pericarditis and drainage   8am om 01/28/2013 Nursing reported respiratory distress, rigors, patient complained of not being able to move left hand with numbness to mid forearm, ataxia of left arm. CT ordered which showed the old left cerebellar infarct.  SUBJECTIVE      OBJECTIVE Most recent Vital Signs: Filed Vitals:   01/29/13 0400 01/29/13 0500 01/29/13 0600 01/29/13 0700  BP: 83/56 96/65 94/65  101/66  Pulse: 108 109 101 103  Temp: 99.1 F (37.3 C) 99.5 F (37.5 C) 99.7 F (37.6 C) 99.7 F (37.6 C)  TempSrc: Core (Comment)     Resp: 18 20 17 16   Height:      Weight:  115.667 kg (255 lb)    SpO2: 100% 99% 99% 100%   CBG (last 3)   Recent Labs  01/28/13 0554 01/28/13 2306  GLUCAP 118* 114*    IV Fluid Intake:   . sodium chloride 20 mL/hr at 01/28/13 2100  . sodium chloride 20 mL/hr at 01/28/13 2100  . sodium chloride    . dexmedetomidine 0.7 mcg/kg/hr (01/29/13 0740)  . DOPamine 3 mcg/kg/min (01/29/13 0700)  . insulin (NOVOLIN-R) infusion 1.6 Units/hr (01/29/13 0700)  . lactated ringers 20 mL/hr at 01/28/13 2100  . milrinone Stopped (01/28/13 2100)  . nitroGLYCERIN Stopped (01/28/13 2100)  . norepinephrine (LEVOPHED) Adult infusion 2.5 mcg/min (01/29/13 0745)    MEDICATIONS  .  acetaminophen  1,000 mg Oral Q6H   Or  . acetaminophen (TYLENOL) oral liquid 160 mg/5 mL  1,000 mg Per Tube Q6H  . acetaminophen (TYLENOL) oral liquid 160 mg/5 mL  650 mg Per Tube Once   Or  . acetaminophen  650 mg Rectal Once  . aminocaproic acid (AMICAR) IVPB  1 g/hr Intravenous Once  . artificial tears  1 application Both Eyes Q8H  . aspirin EC  325 mg Oral Daily   Or  . aspirin  324 mg Per Tube Daily  . bisacodyl  10 mg Oral Daily   Or  . bisacodyl  10 mg Rectal Daily  . docusate sodium  200 mg Oral Daily  . famotidine (PEPCID) IV  20 mg Intravenous Q12H  . fluconazole (DIFLUCAN) IV  400 mg Intravenous Q24H  . imipenem-cilastatin  500 mg Intravenous Q8H  . insulin regular  0-10 Units Intravenous TID WC  . magnesium sulfate  4 g Intravenous Once  . metoprolol tartrate  12.5 mg Oral BID   Or  . metoprolol tartrate  12.5 mg Per Tube BID  . [START ON 01/30/2013] pantoprazole  40 mg Oral Daily  . sodium chloride  3 mL Intravenous Q12H   PRN:  albumin human, metoprolol, midazolam, morphine injection, morphine injection, ondansetron (ZOFRAN) IV, oxyCODONE, sodium chloride  Diet:     NPO Activity:  Bedrest DVT Prophylaxis:  SCD  CLINICALLY SIGNIFICANT STUDIES Basic Metabolic Panel:   Recent Labs Lab 01/28/13 1059  01/29/13 0158 01/29/13 0201  NA 136  < > 139 142  K 4.5  < > 4.7 4.8  CL 103  --  106 105  CO2 19  --   --  26  GLUCOSE 165*  < > 143* 145*  BUN 28*  --  27* 28*  CREATININE 1.40*  --  2.00* 1.83*  CALCIUM 7.5*  --   --  7.0*  MG  --   --   --  1.9  < > = values in this interval not displayed. Liver Function Tests:  No results found for this basename: AST, ALT, ALKPHOS, BILITOT, PROT, ALBUMIN,  in the last 168 hours CBC:   Recent Labs Lab 01/28/13 2050  01/29/13 0158 01/29/13 0201  WBC 16.6*  --   --  14.9*  HGB 8.3*  < > 6.1* 7.2*  HCT 23.4*  < > 18.0* 20.2*  MCV 85.4  --   --  83.8  PLT 118*  --   --  109*  < > = values in this interval not  displayed. Coagulation:   Recent Labs Lab 01/28/13 1059 01/28/13 1858 01/28/13 2011 01/28/13 2050  LABPROT 16.4* 26.1* 21.4* 20.3*  INR 1.36 2.49* 1.92* 1.79*   Cardiac Enzymes:  No results found for this basename: CKTOTAL, CKMB, CKMBINDEX, TROPONINI,  in the last 168 hours Urinalysis:   Recent Labs Lab 01/22/13 1132 01/28/13 0847  COLORURINE AMBER* ORANGE*  LABSPEC 1.034* 1.030  PHURINE 5.5 5.0  GLUCOSEU NEGATIVE NEGATIVE  HGBUR NEGATIVE NEGATIVE  BILIRUBINUR SMALL* MODERATE*  KETONESUR 15* 15*  PROTEINUR 30* 30*  UROBILINOGEN 4.0* 4.0*  NITRITE NEGATIVE POSITIVE*  LEUKOCYTESUR NEGATIVE TRACE*   Lipid Panel    Component Value Date/Time   CHOL 164 01/06/2013 0511   TRIG 109 01/06/2013 0511   HDL 44 01/06/2013 0511   CHOLHDL 3.7 01/06/2013 0511   VLDL 22 01/06/2013 0511   LDLCALC 98 01/06/2013 0511   HgbA1C  Lab Results  Component Value Date   HGBA1C 5.7* 01/05/2013    Urine Drug Screen:     Component Value Date/Time   LABOPIA NONE DETECTED 01/05/2013 2053   COCAINSCRNUR NONE DETECTED 01/05/2013 2053   LABBENZ NONE DETECTED 01/05/2013 2053   AMPHETMU NONE DETECTED 01/05/2013 2053   THCU NONE DETECTED 01/05/2013 2053   LABBARB NONE DETECTED 01/05/2013 2053    Alcohol Level: No results found for this basename: ETH,  in the last 168 hours  Ct Head Wo Contrast 01/28/2013   No acute abnormality. Old left cerebellar infarct. Chronic small vessel ischemic changes.    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 The effusion is located anteriorly around the RV free wall. Maximum diameter in diastole is 10 mm. There is no collapse of the right ventricle in diastole. IVC is dilated with less than  50% respiratory variations. There are no signs of tamponade. There is akinesis of the basal and mid inferior and inferoseptal walls and apical inferior wall. Overall left ventricular ejection fraction, 40-45 % 01/24/2013 A moderate, free-flowing pericardial effusionwas identified circumferential to the heart, predominantly anterior.The fluid contained focal strands. Doppler:  The possibility of hemodynamic compromise cannot be excluded on the basis of available images. Significant mitral inflow variation is noted. The IVC does not collapse and the patient is tachycardic. 10/10/2014Left ventricle: Akinesis inferolateral wall. Dyskinesis base inferior wall. The cavity size was normal. Wall thickness was increased in a pattern of mild LVH. The estimated ejection fraction was 35%. - Right ventricle: The cavity size was moderately dilated. Systolic function was moderately reduced. - Right atrium: The atrium was mildly dilated. - Pericardium, extracardiac: Pericardial effusion is only moderate, but it is larger than yesterday. There is some RV collapse. I am concerned about possible tamponade heodynamics.   Carotid Doppler    CXR    Therapy Recommendations   Physical Exam   Patient intubated.  Paralytics on board hence neurological exam is limited. Eyes are closed. Pupils are 3 mm equal reactive. Doll's eye reflexes absent. Corneal reflexes are present. No motor response to sternal rub or nailbed pressure.   ASSESSMENT [name] is a 52 y.o. male presenting with left arm weakness, numbness and left visual field deficits. CT shows no acute stroke. Symptoms secondary to unknown source but sepsis suspected.  On aspirin 81 mg orally every day and clopidogrel 75 mg orally every day prior to admission. Now in OR with recurrent tamponade. Work up underway.   Respiratory distress, intubated  Recurrent tamponade  Sepsis, on IV abx  LDL 98  HGB A1C   5.7  Former smoker  Coronary artery  disease, s/p stent  Hospital day # 8  TREATMENT/PLAN  MRI/A Brain when patient stabilizes.  Respiratory distress with septic shock, re intubated, iv abx  Patient currently in OR/Dr. Dorris Fetch  Restart aspirin when stable from Operative standpoint.  Gwendolyn Lima. Manson Passey, [facility], MBA, MHA [facility] Pager: [phone] 01/28/2013   I have personally obtained a history, examined the patient, evaluated imaging results, and formulated the assessment and plan of care. I agree with the above. Meshawn Oconnor,MD

## 2013-01-28 NOTE — Consult Note (Addendum)
PULMONARY  / CRITICAL CARE MEDICINE  Name: [name] MRN: [id number] DOB: January 01, 1961    ADMISSION DATE:  01/21/2013 CONSULTATION DATE:  01/28/13  REFERRING MD :  Peter M [place] PRIMARY SERVICE: PCCM  CHIEF COMPLAINT:  Weakness of upper extremity, visual disturbances, resp failure, temp, toxic  BRIEF PATIENT DESCRIPTION: 52 yo male w/ Hx recent MI (9/22) presented  Dressler, drain perciaridaum, now toxic , shock, ett  SIGNIFICANT EVENTS / STUDIES:  10/8 EKG >>> STEMI 10/9 Echocardiogram >>> LVEF 35-40%, small pericardial effusion, mild LVH, dyskinesis of the basalinferior wall, right vent dysfunction 10/10 Hypotensive >>> put on Dopamine 10/11 Repeat Echocardiogram >>> worsening pericardial effusion 10/11 Pericardiocentesis, pericardial drain 10/15- toxic, upper ext weakness and visual changes, septic, fever 104, ett  LINES / TUBES: Central Right IJ 10/15 >>> Right femoral A-line 10/15 >>> ett 10/15>>>  CULTURES: MRSA by PCR 10/8 >>> neg Pericardial fluid 10/11>>> UA 10/15 >>> mod bili, ketones, protein, nitrites, tr leuko, granular casts Urine Cx 10/15 >>> 10/15 BC>>>  ANTIBIOTICS: Elita Quick 10/15 >>> Vancomycin 10/15 >>> Diflucan 10/15 >>> Flagyl 10/15>>>  HISTORY OF PRESENT ILLNESS:   52 yo male w/ Hx recent MI (9/22) presented to [facility] w/ SOB, diaphoresis, chest and back pain 01/21/13. EKG showed STEMI in anterior leads and pericardial effusion on Echo. Drain placed. Drain removed. Sudden left arm weakness and visual changes. Toxic, fever 104, ett placed hypoxia. Shock.  PAST MEDICAL HISTORY :  Past Medical History  Diagnosis Date  . Coronary artery disease     a. s/p MI in 2009 in [place] with stenting of the LCX and LAD;  b. 12/2012 NSTEMI/CAD: LM nl, LAD patent prox stent, LCX 50-70 isr (FFR 0.84), RCA dom, 174m, EF 40-45%, sev basal to mid inf HK to AK-->Med Rx.  . Tobacco abuse     a. quit 12/2012.  . Ischemic cardiomyopathy     a. 12/2012 Ech: EF 40-45%, Gr 1 DD.   Marland Kitchen Hyperlipidemia    Past Surgical History  Procedure Laterality Date  . Coronary angioplasty with stent placement  2000's X 2    "1 + 1" (01/06/2013)  . Cardiac catheterization  01/06/2013   Prior to Admission medications   Medication Sig Start Date End Date Taking? Authorizing Provider  aspirin EC 81 MG EC tablet Take 1 tablet (81 mg total) by mouth daily. 01/07/13  Yes Rhonda G Barrett, PA-C  clopidogrel (PLAVIX) 75 MG tablet Take 1 tablet (75 mg total) by mouth daily. 01/07/13  Yes Rhonda G Barrett, PA-C  lisinopril (PRINIVIL,ZESTRIL) 5 MG tablet Take 1 tablet (5 mg total) by mouth daily. 01/07/13  Yes Rhonda G Barrett, PA-C  metoprolol tartrate (LOPRESSOR) 25 MG tablet Take 1 tablet (25 mg total) by mouth 2 (two) times daily. 01/07/13  Yes Rhonda G Barrett, PA-C  nitroGLYCERIN (NITROSTAT) 0.4 MG SL tablet Place 1 tablet (0.4 mg total) under the tongue every 5 (five) minutes as needed for chest pain. 01/07/13  Yes Rhonda G Barrett, PA-C  simvastatin (ZOCOR) 40 MG tablet Take 1 tablet (40 mg total) by mouth at bedtime. 01/07/13  Yes Rhonda G Barrett, PA-C   No Known Allergies  FAMILY HISTORY:  Family History  Problem Relation Age of Onset  . CAD     SOCIAL HISTORY:  reports that he quit smoking about 3 weeks ago. His smoking use included Cigarettes. He has a 35 pack-year smoking history. He has never used smokeless tobacco. He reports that he does not drink alcohol or  use illicit drugs.  REVIEW OF SYSTEMS:  Unable to obtain due to pt's current status  SUBJECTIVE:   VITAL SIGNS: Temp:  [98.2 F (36.8 C)-98.5 F (36.9 C)] 98.5 F (36.9 C) (10/15 0411) Pulse Rate:  [87-142] 142 (10/15 0746) Resp:  [18-23] 18 (10/15 0411) BP: (86-118)/(64-102) 118/102 mmHg (10/15 0746) SpO2:  [83 %-100 %] 91 % (10/15 0746) FiO2 (%):  [20 %-100 %] 100 % (10/15 0810) Weight:  [110.451 kg (243 lb 8 oz)] 110.451 kg (243 lb 8 oz) (10/15 0411) HEMODYNAMICS:   VENTILATOR SETTINGS: Vent Mode:  [-]  PRVC FiO2 (%):  [20 %-100 %] 100 % Set Rate:  [20 bmp] 20 bmp Vt Set:  [500 mL] 500 mL PEEP:  [10 cmH20] 10 cmH20 Plateau Pressure:  [20 cmH20] 20 cmH20 INTAKE / OUTPUT: Intake/Output     10/14 0701 - 10/15 0700 10/15 0701 - 10/16 0700   P.O. 600    I.V. (mL/kg)     Total Intake(mL/kg) 600 (5.4)    Urine (mL/kg/hr) 325 (0.1) 40 (0.1)   Drains     Total Output 325 40   Net +275 -40          PHYSICAL EXAMINATION: General:  Completely toxic, severe distress, diaphoretic Neuro:  lue ext weakness, follows commands, cooperative HEENT:  jvd noted Cardiovascular:  s1 s2 rr tachy 150 Lungs:  ronchi diffuse Abdomen:  Soft, BS hypo, nt, nd Musculoskeletal:  No edema, pulses wnl Skin:  No rash  LABS:  CBC Recent Labs     01/26/13  0355  01/27/13  0500  01/28/13  0440  WBC  10.5  10.5  11.5*  HGB  11.1*  11.1*  10.2*  HCT  31.1*  31.8*  29.1*  PLT  288  361  392   Coag's No results found for this basename: APTT, INR,  in the last 72 hours BMET Recent Labs     01/26/13  0355  01/27/13  0500  01/28/13  0440  NA  131*  134*  132*  K  3.5  4.1  3.9  CL  100  101  98  CO2  21  20  22   BUN  21  21  26*  CREATININE  0.74  0.88  1.16  GLUCOSE  97  114*  119*   Electrolytes Recent Labs     01/26/13  0355  01/27/13  0500  01/28/13  0440  CALCIUM  7.3*  7.8*  8.2*   Sepsis Markers No results found for this basename: LACTICACIDVEN, PROCALCITON, O2SATVEN,  in the last 72 hours ABG No results found for this basename: PHART, PCO2ART, PO2ART,  in the last 72 hours Liver Enzymes No results found for this basename: AST, ALT, ALKPHOS, BILITOT, ALBUMIN,  in the last 72 hours Cardiac Enzymes No results found for this basename: TROPONINI, PROBNP,  in the last 72 hours Glucose Recent Labs     01/28/13  0554  GLUCAP  118*    Imaging Ct Head Wo Contrast  01/28/2013   CLINICAL DATA:  Code stroke.  Weakness.  EXAM: CT HEAD WITHOUT CONTRAST  TECHNIQUE: Contiguous axial  images were obtained from the base of the skull through the vertex without intravenous contrast.  COMPARISON:  None.  FINDINGS: No mass lesion. No midline shift. No acute hemorrhage or hematoma. No extra-axial fluid collections. No evidence of acute infarction.  There is an old left cerebellar hemisphere infarct. There are a few tiny vague periventricular white matter  lucencies most likely representing chronic small vessel ischemic changes.  Osseous structures are normal.  IMPRESSION: No acute abnormality. Old left cerebellar infarct. Chronic small vessel ischemic changes.  Critical Value/emergent results were called by telephone at the time of interpretation on 01/28/2013 at 7:08 AM to [facility], RN, who verbally acknowledged these results.   Electronically Signed   By: Geanie Cooley M.D.   On: 01/28/2013 07:09   Dg Chest Port 1 View  01/28/2013   CLINICAL DATA:  Respiratory distress. Intubation.  EXAM: PORTABLE CHEST - 1 VIEW  COMPARISON:  11/22/2012 and 01/05/2013.  FINDINGS: 0823 hr. Endotracheal tube is in the midtrachea, 4.7 cm above the carina. The heart size is stable at the upper limits of normal for portable AP technique. There are lower lung volumes with mildly increased bibasilar atelectasis. No edema, confluent airspace opacity or pleural effusion is seen.  IMPRESSION: Satisfactorily positioned endotracheal tube. Low lung volumes with mild basilar atelectasis.   Electronically Signed   By: Roxy Horseman M.D.   On: 01/28/2013 08:34     CXR: 10/15 Right IJ central line, ETT tube in good position. ? Central vascular congestion, ? LLL pneumonia  ASSESSMENT / PLAN:  PULMONARY A: Acute resp failure, r/o edema, toxic Hemodynamic collapse P:   ETT tube emergent placed, 7 cc/kg STAT pcxr follow up Peep increase to 12, then repeat abg A line required  CARDIOVASCULAR A:  Hx recent MI, inf infarct Pericardial Effusion w/ Cardiac Tamponade- effusion consistent with Dressler Syndrome initially   R/o bacterial embolization from endocarditis., new aneurysm / mycotic? R/o bacterial pericardial abscess Cardiogenic Shock likely Tachycardia P:  Appears as complete collapse hemodynamically Line, cvp, svo2 Volume for preload for pericardial abscess / tamponade likely A line Levophed to map goals Cortisol Lactic acid  RENAL A:   Hypokalemia Acute Kidney injury, embolization likely ATN P:   Chem frequent abx Volume [place] assess perinephric abscess  GASTROINTESTINAL A:   NPO P:   SUP: Protonix lft npo  HEMATOLOGIC A:  Anemia of chronic disease P:  subQ hep for DVT prophylaxis for now Cbc scd  INFECTIOUS A:   Septic shock R/o bacterial embolization from mycotic aneurysm / endocarditis R/o pericardial abscess P:   STAT ceftaz, flagy, vanc,  Diflucan until mediastinal involvement r/o ID to see [facility] CVTS consult  ENDOCRINE A:  R/o rel AI P:   SSI cortisol  NEUROLOGIC A:  AMS, likely septic emboli to head P:  Severe dyschrtony, now shock, dc propofol Ad fent / versed Nimbex required NO [place] Stroke service Ct reviewed Treat infection, see cvs   TODAY'S SUMMARY: toxic, ETT, levophed, cvts  I have personally obtained a history, examined the patient, evaluated laboratory and imaging results, formulated the assessment and plan and placed orders. CRITICAL CARE: The patient is critically ill with multiple organ systems failure and requires high complexity decision making for assessment and support, frequent evaluation and titration of therapies, application of advanced monitoring technologies and extensive interpretation of multiple databases. Critical Care Time devoted to patient care services described in this note is 90 minutes.  Family updated  Mcarthur Rossetti. [name], MD, FACP Pgr: [phone] [facility]  Pulmonary and Critical Care Medicine [facility] Pager: [phone]  01/28/2013, 9:32 AM

## 2013-01-28 NOTE — Progress Notes (Signed)
Report received from April RRT RN.  Plan for CTA of head and neck.  Patient alert and oriented complaining of being cold, patient covered in blankets.  BP 117/69  HR 110  RR 24  O2 sat 93-94%   Right AC PIV started by RN.  Report being called to ICU by RN. Girlfriend notified of patient status by RN. About 0720 patient began shivering,  ST 150s, RR 48  O2 sat 88-90% on NRB sudden increase in WOB, patient A&O.  Transported urgently to 2H02 via bed with heart monitor and O2.  Staff at bedside.  Patient prepared for urgent intubation.

## 2013-01-28 NOTE — Preoperative (Signed)
Beta Blockers   Lopressor 25mg  PO @

## 2013-01-28 NOTE — Anesthesia Preprocedure Evaluation (Addendum)
Anesthesia Evaluation  Patient identified by MRN, date of birth, ID band Patient unresponsive    Reviewed: Allergy & Precautions, H&P , NPO status , Patient's Chart, lab work & pertinent test results, reviewed documented beta blocker date and time   Airway       Dental   Pulmonary COPDCurrent Smoker,  01-28-13 Chest x-ray FINDINGS: Endotracheal tube is in satisfactory position. Nasogastric tube is followed into the stomach with the tip projecting beyond the inferior margin of the image. Interval right IJ central line placement with tip projecting over the SVC.   Heart is enlarged, stable. Mild central pulmonary vascular congestion and/or vascular crowding. Suspect developing left lower lobe airspace disease. No pleural fluid. No pneumothorax.   IMPRESSION: 1. Right IJ central line placement without complicating feature. 2. Central pulmonary vascular congestion versus vascular crowding. 3. Question developing left lower lobe airspace disease.   + rhonchi   + wheezing      Cardiovascular Exercise Tolerance: Poor hypertension, Pt. on medications + CAD, + Past MI, + Cardiac Stents and +CHF Rhythm:Regular Rate:Tachycardia  Ischemic cardiomyopathy ECHO 01-28-13 *[place]*                *[facility]*                      [address]                     [place], [place] [address]                         [phone]   ------------------------------------------------------------ Transthoracic Echocardiography  Patient:    [name], [name] MR #:       [id number] Study Date: 01/25/2013 Gender:     M Age:        52 Height:     185.4cm Weight:     105.9kg BSA:        2.60m^2 Pt. Status: Room:       [facility]    PERFORMING   [place], [facility]     Tonny Bollman  Wardell Honour, Gaynell Face  ATTENDING    Verne Carrow  SONOGRAPHER  Jimmy  Reel cc:  ------------------------------------------------------------  ------------------------------------------------------------ Indications:      Pericardial effusion 423.9.  ------------------------------------------------------------ Recommendations:  This is a limited study for the evaluation of of the pericardial effusion post pericardiocentesis yesterday. The ampunt of pericardial effusion is significantly decreased and is now mild. The effusion is located anteriorly around the RV free wall. Maximum diameter in diastole is 10 mm. There is no collapse of the right ventricle in diastole. IVC is dilated with less than 50% respiratory variations. There are no signs of tamponade.  There is akinesis of the basal and mid inferior and inferoseptal walls and apical inferior wall. Overall left ventricular ejection fraction is 40-45 %. Transthoracic    Neuro/Psych negative neurological ROS  negative psych ROS   GI/Hepatic negative GI ROS, Neg liver ROS,   Endo/Other    Renal/GU negative Renal ROS     Musculoskeletal  (+) Arthritis -,   Abdominal   Peds  Hematology   Anesthesia Other Findings Intubated Obese  Reproductive/Obstetrics                       Anesthesia Physical Anesthesia Plan  ASA: IV and emergent  Anesthesia  Plan: General   Post-op Pain Management:    Induction: Intravenous  Airway Management Planned: Oral ETT  Additional Equipment: PA Cath, TEE and Arterial line  Intra-op Plan:   Post-operative Plan: Post-operative intubation/ventilation  Informed Consent: I have reviewed the patients History and Physical, chart, labs and discussed the procedure including the risks, benefits and alternatives for the proposed anesthesia with the patient or authorized representative who has indicated his/her understanding and acceptance.     Plan Discussed with: CRNA and Surgeon  Anesthesia Plan Comments:         Anesthesia  Quick Evaluation

## 2013-01-28 NOTE — Transfer of Care (Signed)
Immediate Anesthesia Transfer of Care Note  Patient: [name]  Procedure(s) Performed: Procedure(s) with comments: CORONARY ARTERY BYPASS GRAFTING (CABG) (N/A) - CABG x one, using left greater saphenous vein harvested endoscopically INTRAOPERATIVE TRANSESOPHAGEAL ECHOCARDIOGRAM (N/A) LEFT VENTRICULAR ANEURYSM REPAIR (N/A)  Patient Location: SICU  Anesthesia Type:General  Level of Consciousness: sedated and Patient remains intubated per anesthesia plan  Airway & Oxygen Therapy: Patient remains intubated per anesthesia plan and Patient placed on Ventilator (see vital sign flow sheet for setting)  Post-op Assessment: Report given to SICU RN and Post -op Vital signs reviewed and stable .  Post vital signs: Reviewed and stable  Complications: No apparent anesthesia complications

## 2013-01-28 NOTE — Anesthesia Procedure Notes (Signed)
Procedure Name: Intubation Date/Time: 01/28/2013 2:00 PM Performed by: Tyrone Nine Pre-anesthesia Checklist: Patient identified, Emergency Drugs available, Suction available and Patient being monitored Oxygen Delivery Method: Circle system utilized Tube size: 7.5 mm Placement Confirmation: positive ETCO2 Tube secured with: Tape Comments: Pt arrived intubated, sedated, with muscle relaxant on board.

## 2013-01-28 NOTE — Anesthesia Postprocedure Evaluation (Signed)
  Anesthesia Post-op Note  Patient: [name]  Procedure(s) Performed: Procedure(s) with comments: CORONARY ARTERY BYPASS GRAFTING (CABG) (N/A) - CABG x one, using left greater saphenous vein harvested endoscopically INTRAOPERATIVE TRANSESOPHAGEAL ECHOCARDIOGRAM (N/A) LEFT VENTRICULAR ANEURYSM REPAIR (N/A)  Patient Location: ICU  Anesthesia Type:General  Level of Consciousness: sedated and unresponsive  Airway and Oxygen Therapy: Patient remains intubated per anesthesia plan and Patient placed on Ventilator (see vital sign flow sheet for setting)  Post-op Pain: none  Post-op Assessment: Post-op Vital signs reviewed, PATIENT'S CARDIOVASCULAR STATUS UNSTABLE and Respiratory Function Stable  Post-op Vital Signs: Reviewed  Complications: No apparent anesthesia complications

## 2013-01-28 NOTE — Progress Notes (Signed)
Echocardiogram 2D Echocardiogram limited has been performed.  Remedy Corporan 01/28/2013, 11:11 AM

## 2013-01-28 NOTE — Consult Note (Signed)
Reason for Consult: pericardial tamponade Referring Physician: Dr. Sw[place]il[place]  [name] is an 52 y.o. male.  HPI: 52 yo male with history of CAD had new onset left sided weakness and visual field defect, sudden hemodynamic collapse and new fever this AM.  [name] is a 53 yo male with a history of CAD, with stents placed in LAD and circumflex in [place] in 2007. He presented with a 4 day history of CP in September. EKG showed and old inferoposterior MI. He had catheterization which showed a totally occluded RCA and a 50% ISR of the LCx stent. Readmitted with recurrent CP and anterolateral ST elevation on 10/8. An echo showed a large effusion and tamponade. A pericardial drain was placed. The drain was removed yesterday. He was doing well and dc anticipated within next 48 hours.  This morning he developed sudden onset of left arm weakness. He then developed a left visual field defect. He then had a sudden hemodynamic collapse with hypotension and tachycardia. He also spiked a temperature to 104.56F. He required intubation and is requiring high levels of PEEP. A repeat echo showed a complex effusion with RV collapse and tamponade. He responded hemodynamically to volume resuscitation and levophed and was started on broad spectrum antibiotics with a presumed diagnosis of purulent pericarditis. Currently intubated and sedated.      Past Medical History  Diagnosis Date  . Coronary artery disease     a. s/p MI in 2009 in [place] with stenting of the LCX and LAD;  b. 12/2012 NSTEMI/CAD: LM nl, LAD patent prox stent, LCX 50-70 isr (FFR 0.84), RCA dom, 197m, EF 40-45%, sev basal to mid inf HK to AK-->Med Rx.  . Tobacco abuse     a. quit 12/2012.  . Ischemic cardiomyopathy     a. 12/2012 Ech: EF 40-45%, Gr 1 DD.  Marland Kitchen Hyperlipidemia     Past Surgical History  Procedure Laterality Date  . Coronary angioplasty with stent placement  2000's X 2    "1 + 1" (01/06/2013)  . Cardiac catheterization  01/06/2013     Family History  Problem Relation Age of Onset  . CAD      Social History:  reports that he quit smoking about 3 weeks ago. His smoking use included Cigarettes. He has a 35 pack-year smoking history. He has never used smokeless tobacco. He reports that he does not drink alcohol or use illicit drugs.  Allergies: No Known Allergies  Medications:  Continuous: . sodium chloride 20 mL/hr at 01/28/13 1132  . sodium chloride 250 mL/hr at 01/28/13 1137  . cisatracurium (NIMBEX) infusion 3 mcg/kg/min (01/28/13 1100)  . fentaNYL infusion INTRAVENOUS 50 mcg/hr (01/28/13 0944)  . midazolam (VERSED) infusion 4 mg/hr (01/28/13 1136)  . norepinephrine (LEVOPHED) Adult infusion 5 mcg/min (01/28/13 1135)  . propofol Stopped (01/28/13 1030)    Results for orders placed during the hospital encounter of 01/21/13 (from the past 48 hour(s))  BASIC METABOLIC PANEL     Status: Abnormal   Collection Time    01/27/13  5:00 AM      Result Value Range   Sodium 134 (*) 135 - 145 mEq/L   Potassium 4.1  3.5 - 5.1 mEq/L   Chloride 101  96 - 112 mEq/L   CO2 20  19 - 32 mEq/L   Glucose, Bld 114 (*) 70 - 99 mg/dL   BUN 21  6 - 23 mg/dL   Creatinine, Ser 1.61  0.50 - 1.35 mg/dL  Calcium 7.8 (*) 8.4 - 10.5 mg/dL   GFR calc non Af Amer >90  >90 mL/min   GFR calc Af Amer >90  >90 mL/min   Comment: (NOTE)     The eGFR has been calculated using the CKD EPI equation.     This calculation has not been validated in all clinical situations.     eGFR's persistently <90 mL/min signify possible Chronic Kidney     Disease.  CBC     Status: Abnormal   Collection Time    01/27/13  5:00 AM      Result Value Range   WBC 10.5  4.0 - 10.5 K/uL   RBC 3.92 (*) 4.22 - 5.81 MIL/uL   Hemoglobin 11.1 (*) 13.0 - 17.0 g/dL   HCT 45.4 (*) 09.8 - 11.9 %   MCV 81.1  78.0 - 100.0 fL   MCH 28.3  26.0 - 34.0 pg   MCHC 34.9  30.0 - 36.0 g/dL   RDW 14.7  82.9 - 56.2 %   Platelets 361  150 - 400 K/uL  BASIC METABOLIC PANEL      Status: Abnormal   Collection Time    01/28/13  4:40 AM      Result Value Range   Sodium 132 (*) 135 - 145 mEq/L   Potassium 3.9  3.5 - 5.1 mEq/L   Chloride 98  96 - 112 mEq/L   CO2 22  19 - 32 mEq/L   Glucose, Bld 119 (*) 70 - 99 mg/dL   BUN 26 (*) 6 - 23 mg/dL   Creatinine, Ser 1.30  0.50 - 1.35 mg/dL   Calcium 8.2 (*) 8.4 - 10.5 mg/dL   GFR calc non Af Amer 71 (*) >90 mL/min   GFR calc Af Amer 83 (*) >90 mL/min   Comment: (NOTE)     The eGFR has been calculated using the CKD EPI equation.     This calculation has not been validated in all clinical situations.     eGFR's persistently <90 mL/min signify possible Chronic Kidney     Disease.  CBC     Status: Abnormal   Collection Time    01/28/13  4:40 AM      Result Value Range   WBC 11.5 (*) 4.0 - 10.5 K/uL   RBC 3.58 (*) 4.22 - 5.81 MIL/uL   Hemoglobin 10.2 (*) 13.0 - 17.0 g/dL   HCT 86.5 (*) 78.4 - 69.6 %   MCV 81.3  78.0 - 100.0 fL   MCH 28.5  26.0 - 34.0 pg   MCHC 35.1  30.0 - 36.0 g/dL   RDW 29.5  28.4 - 13.2 %   Platelets 392  150 - 400 K/uL  GLUCOSE, CAPILLARY     Status: Abnormal   Collection Time    01/28/13  5:54 AM      Result Value Range   Glucose-Capillary 118 (*) 70 - 99 mg/dL  URINALYSIS, ROUTINE W REFLEX MICROSCOPIC     Status: Abnormal   Collection Time    01/28/13  8:47 AM      Result Value Range   Color, Urine ORANGE (*) YELLOW   Comment: BIOCHEMICALS MAY BE AFFECTED BY COLOR   APPearance TURBID (*) CLEAR   Specific Gravity, Urine 1.030  1.005 - 1.030   pH 5.0  5.0 - 8.0   Glucose, UA NEGATIVE  NEGATIVE mg/dL   Hgb urine dipstick NEGATIVE  NEGATIVE   Bilirubin Urine MODERATE (*) NEGATIVE  Ketones, ur 15 (*) NEGATIVE mg/dL   Protein, ur 30 (*) NEGATIVE mg/dL   Urobilinogen, UA 4.0 (*) 0.0 - 1.0 mg/dL   Nitrite POSITIVE (*) NEGATIVE   Leukocytes, UA TRACE (*) NEGATIVE  URINE MICROSCOPIC-ADD ON     Status: Abnormal   Collection Time    01/28/13  8:47 AM      Result Value Range   Squamous  Epithelial / LPF RARE  RARE   WBC, UA 0-2  <3 WBC/hpf   RBC / HPF 0-2  <3 RBC/hpf   Bacteria, UA RARE  RARE   Casts GRANULAR CAST (*) NEGATIVE   Comment: HYALINE CASTS   Urine-Other MUCOUS PRESENT     Comment: AMORPHOUS URATES/PHOSPHATES     URINALYSIS PERFORMED ON SUPERNATANT  POCT I-STAT 3, BLOOD GAS (G3+)     Status: Abnormal   Collection Time    01/28/13  9:29 AM      Result Value Range   pH, Arterial 7.355  7.350 - 7.450   pCO2 arterial 34.0 (*) 35.0 - 45.0 mmHg   pO2, Arterial 82.0  80.0 - 100.0 mmHg   Bicarbonate 18.3 (*) 20.0 - 24.0 mEq/L   TCO2 19  0 - 100 mmol/L   O2 Saturation 93.0     Acid-base deficit 5.0 (*) 0.0 - 2.0 mmol/L   Patient temperature 104.7 F     Collection site RADIAL, ALLEN'S TEST ACCEPTABLE     Drawn by RT     Sample type ARTERIAL    PROTIME-INR     Status: Abnormal   Collection Time    01/28/13 10:59 AM      Result Value Range   Prothrombin Time 16.4 (*) 11.6 - 15.2 seconds   INR 1.36  0.00 - 1.49  BASIC METABOLIC PANEL     Status: Abnormal   Collection Time    01/28/13 10:59 AM      Result Value Range   Sodium 136  135 - 145 mEq/L   Potassium 4.5  3.5 - 5.1 mEq/L   Chloride 103  96 - 112 mEq/L   CO2 19  19 - 32 mEq/L   Glucose, Bld 165 (*) 70 - 99 mg/dL   BUN 28 (*) 6 - 23 mg/dL   Creatinine, Ser 1.61 (*) 0.50 - 1.35 mg/dL   Calcium 7.5 (*) 8.4 - 10.5 mg/dL   GFR calc non Af Amer 57 (*) >90 mL/min   GFR calc Af Amer 66 (*) >90 mL/min   Comment: (NOTE)     The eGFR has been calculated using the CKD EPI equation.     This calculation has not been validated in all clinical situations.     eGFR's persistently <90 mL/min signify possible Chronic Kidney     Disease.  LACTIC ACID, PLASMA     Status: None   Collection Time    01/28/13 11:14 AM      Result Value Range   Lactic Acid, Venous 1.1  0.5 - 2.2 mmol/L    Ct Head Wo Contrast  01/28/2013   CLINICAL DATA:  Code stroke.  Weakness.  EXAM: CT HEAD WITHOUT CONTRAST  TECHNIQUE:  Contiguous axial images were obtained from the base of the skull through the vertex without intravenous contrast.  COMPARISON:  None.  FINDINGS: No mass lesion. No midline shift. No acute hemorrhage or hematoma. No extra-axial fluid collections. No evidence of acute infarction.  There is an old left cerebellar hemisphere infarct. There are a few tiny vague  periventricular white matter lucencies most likely representing chronic small vessel ischemic changes.  Osseous structures are normal.  IMPRESSION: No acute abnormality. Old left cerebellar infarct. Chronic small vessel ischemic changes.  Critical Value/emergent results were called by telephone at the time of interpretation on 01/28/2013 at 7:08 AM to [facility], RN, who verbally acknowledged these results.   Electronically Signed   By: Geanie Cooley M.D.   On: 01/28/2013 07:09   Dg Chest Port 1 View  01/28/2013   CLINICAL DATA:  Right central line placement.  EXAM: PORTABLE CHEST - 1 VIEW  COMPARISON:  01/28/2013 at airway 23 hr.  FINDINGS: Endotracheal tube is in satisfactory position. Nasogastric tube is followed into the stomach with the tip projecting beyond the inferior margin of the image. Interval right IJ central line placement with tip projecting over the SVC.  Heart is enlarged, stable. Mild central pulmonary vascular congestion and/or vascular crowding. Suspect developing left lower lobe airspace disease. No pleural fluid. No pneumothorax.  IMPRESSION: 1. Right IJ central line placement without complicating feature. 2. Central pulmonary vascular congestion versus vascular crowding. 3. Question developing left lower lobe airspace disease.   Electronically Signed   By: Leanna Battles M.D.   On: 01/28/2013 10:00   Dg Chest Port 1 View  01/28/2013   CLINICAL DATA:  Respiratory distress. Intubation.  EXAM: PORTABLE CHEST - 1 VIEW  COMPARISON:  11/22/2012 and 01/05/2013.  FINDINGS: 0823 hr. Endotracheal tube is in the midtrachea, 4.7 cm above the carina.  The heart size is stable at the upper limits of normal for portable AP technique. There are lower lung volumes with mildly increased bibasilar atelectasis. No edema, confluent airspace opacity or pleural effusion is seen.  IMPRESSION: Satisfactorily positioned endotracheal tube. Low lung volumes with mild basilar atelectasis.   Electronically Signed   By: Roxy Horseman M.D.   On: 01/28/2013 08:34    Review of Systems  Unable to perform ROS: intubated   Blood pressure 106/68, pulse 116, temperature 102.6 F (39.2 Tucker), temperature source Oral, resp. rate 18, height 6\' 1"  (1.854 m), weight 243 lb 8 oz (110.451 kg), SpO2 100.00%. Physical Exam  Vitals reviewed. Constitutional:  Obese, intubated  Neck: JVD present. No tracheal deviation present.  Cardiovascular: Exam reveals friction rub.   No murmur heard. Tachy  Respiratory: Effort normal.  Rhonchi bilaterally  GI: Bowel sounds are normal. He exhibits distension.  Genitourinary:  Cloudy urine  Lymphadenopathy:    He has no cervical adenopathy.  Neurological:  Intubated, left sided weakness this AM  Skin:  diaphoretic   ECHOCARDIOGRAM 01/23/2013 Study Conclusions  - Left ventricle: Akinesis inferolateral wall. Dyskinesis base inferior wall. The cavity size was normal. Wall thickness was increased in a pattern of mild LVH. The estimated ejection fraction was 35%. - Right ventricle: The cavity size was moderately dilated. Systolic function was moderately reduced. - Right atrium: The atrium was mildly dilated. - Pericardium, extracardiac: Pericardial effusion is only moderate, but it is larger than yesterday. There is some RV collapse. I am concerned about possible tamponade heodynamics.  01/25/2013  Recommendations: This is a limited study for the evaluation of of the pericardial effusion post pericardiocentesis yesterday. The ampunt of pericardial effusion is significantly decreased and is now mild. The effusion is  located anteriorly around the RV free wall. Maximum diameter in diastole is 10 mm. There is no collapse of the right ventricle in diastole. IVC is dilated with less than 50% respiratory variations. There are no signs of tamponade.  There is akinesis of the basal and mid inferior and inferoseptal walls and apical inferior wall. Overall left ventricular ejection fraction is 40-45 %.     Assessment/Plan: 52 yo male s/p recent MI. 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   This AM he had a sudden neurologic event and then hemodynamic collapse. He also has spiked a fever to 104. The inittial working hypothesis was bacterial pericarditis with tamponade, and that remains a possibility.  However, the abruptness of the events is suspicious that there is more going on. Also he had a drain in for several days that was just removed yesterday and no purulent fluid was noted.  I think a more likely scenario is a contained rupture of an LV aneurysm. That could explain the neurologic events and the sudden collapse. The fever is harder to explain in that setting but could be due to blood in the pericardium or central.   In any event he needs the pericardial tamponade relieved. He has stabilized to some degree but will likely continue to deteriorate if tamponade is allowed to persist.   There are 2 options here, neither of which is very appealing. The aggressive approach would be to take him to the OR for relief of the tamponade. This would entail TEE, probably a sternotomy on CPB and repair of the LV aneurysm, if in fact it is present. The obvious issues that complicate that approach are the fevers and possible sepsis and the acute CVA. The other option is to manage him medically for a period of days and see if some of these other issues resolve. In my mind a subxiphoid window is not an option, unless it can be proven beyond  doubt that there is no ruptured aneurysm on TEE. After discussion with Dr. [name] and Dr. [place] we all agree that we feel his best chance is with surgery.  I don't honestly know which of these approaches would be better but I strongly doubt he has any significant chance of survival without surgical intervention. Therefore I would favor the more aggressive approach even with its very high risk nature.I do think this is a 50-50 salvage type of procedure and it may well worsen his neurologic damage.  I had a long and detailed discussion with the patient's father, who is the next of kin(he has a daughter who is estranged and lives in [place]). I informed him of the situation, the possible sequences of events that could have led to where patient is now, and our options for treatment. He understands that the surgery is high risk and will be complicated. He understands the risks include death, MI, stroke, bleeding, need for transfusion, infection and other organ system failure.   I have spent 90 minutes in examination, review of studies, discussions with other physicians and family.  [name] 01/28/2013, 12:47 PM

## 2013-01-28 NOTE — Progress Notes (Signed)
Patients cell phone, glasses and other personal belongings were given to family members prior to his leaving [facility] for surgery.

## 2013-01-28 NOTE — Progress Notes (Signed)
INITIAL NUTRITION ASSESSMENT  DOCUMENTATION CODES Per approved criteria  -Obesity Unspecified   INTERVENTION: 1.  Enteral nutrition; initiate Vital HP @ 20 mL/hr continuous. Advance by 10 mL q 4 hrs to 80 mL/hr goal to provide 1920 kcal, 168g protein, 1557 mL free water.   NUTRITION DIAGNOSIS: Inadequate oral intake related to inability to eat as evidenced by vent.   Monitor:  1.  Enteral nutrition; initiation with tolerance.  Enteral nutrition to provide 60-70% of estimated calorie needs (22-25 kcals/kg ideal body weight) and 100% of estimated protein needs, based on ASPEN guidelines for permissive underfeeding in critically ill obese individuals. 2.  Wt/wt change; monitor trends  Reason for Assessment: NPO x7 days  52 y.o. male  Admitting Dx: Acute pericarditis, unspecified  ASSESSMENT: Pt admitted with recurrent chest pain, found to have STEMI. Pt with pericardial effusion s/p drain placement, removed (10/14). Neurology also consulted due to left arm weakness and numbness.  PO intake has not been documented.  Pt has been on Heart Healthy diet since (10/8).  RD arrived to unit to find pt has been intubated.  Code Stroke this AM r/t to weakness, visual disturbances, and elevated temp, and respiratory failure.    Patient is currently intubated on ventilator support.  MV: 9.6 L/min Temp:Temp (24hrs), Avg:102.4 F (39.1 C), Min:98.2 F (36.8 C), Max:105.1 F (40.6 C)  Propofol: none  Nutrition Focused Physical Exam:  Subcutaneous Fat:  Orbital Region: WNL Upper Arm Region: WNL Thoracic and Lumbar Region: WNL  Muscle:  Temple Region: WNL Clavicle Bone Region: WNL Clavicle and Acromion Bone Region: WNL Scapular Bone Region: WNL Dorsal Hand: WNL Patellar Region: WNL Anterior Thigh Region: WNL Posterior Calf Region: WNL  Edema: none present   Height: Ht Readings from Last 1 Encounters:  01/21/13 6\' 1"  (1.854 m)    Weight: Wt Readings from Last 1 Encounters:   01/28/13 243 lb 8 oz (110.451 kg)    Ideal Body Weight: 83.6 kg  % Ideal Body Weight: 132%  Wt Readings from Last 10 Encounters:  01/28/13 243 lb 8 oz (110.451 kg)  01/28/13 243 lb 8 oz (110.451 kg)  01/28/13 243 lb 8 oz (110.451 kg)  01/06/13 234 lb 5.6 oz (106.3 kg)  01/06/13 234 lb 5.6 oz (106.3 kg)    Usual Body Weight: unable to assess  % Usual Body Weight: unknown  BMI:  Body mass index is 32.13 kg/(m^2).  Estimated Nutritional Needs: Kcal: 2537 Permissive underfeeding:  1830-2080 Protein: 167g Fluid: ~2.5 L/day  Skin: intact  Diet Order: NPO  EDUCATION NEEDS: -Education needs addressed   Intake/Output Summary (Last 24 hours) at 01/28/13 0941 Last data filed at 01/28/13 0840  Gross per 24 hour  Intake    360 ml  Output    190 ml  Net    170 ml    Last BM: 10/14  Labs:   Recent Labs Lab 01/26/13 0355 01/27/13 0500 01/28/13 0440  NA 131* 134* 132*  K 3.5 4.1 3.9  CL 100 101 98  CO2 21 20 22   BUN 21 21 26*  CREATININE 0.74 0.88 1.16  CALCIUM 7.3* 7.8* 8.2*  GLUCOSE 97 114* 119*    CBG (last 3)   Recent Labs  01/28/13 0554  GLUCAP 118*    Scheduled Meds: . aspirin EC  81 mg Oral Daily  . cefTAZidime (FORTAZ)  IV  2 g Intravenous Q8H  . clopidogrel  75 mg Oral Daily  . colchicine  0.6 mg Oral BID  . [  START ON 01/29/2013] fluconazole (DIFLUCAN) IV  100 mg Intravenous Q24H  . fluconazole (DIFLUCAN) IV  200 mg Intravenous Once  . heparin subcutaneous  5,000 Units Subcutaneous Q8H  . indomethacin  50 mg Oral TID WC  . metoprolol tartrate  25 mg Oral BID  . pantoprazole  40 mg Oral BID  . piperacillin-tazobactam (ZOSYN)  IV  3.375 g Intravenous STAT  . simvastatin  40 mg Oral QHS  . sodium chloride  3 mL Intravenous Q12H  . vancomycin  2,000 mg Intravenous STAT  . vancomycin  1,000 mg Intravenous Q12H    Continuous Infusions: . sodium chloride 10 mL/hr at 01/25/13 2200  . fentaNYL infusion INTRAVENOUS    . midazolam (VERSED)  infusion    . propofol 20 mcg/kg/min (01/28/13 0840)    Past Medical History  Diagnosis Date  . Coronary artery disease     a. s/p MI in 2009 in [place] with stenting of the LCX and LAD;  b. 12/2012 NSTEMI/CAD: LM nl, LAD patent prox stent, LCX 50-70 isr (FFR 0.84), RCA dom, 120m, EF 40-45%, sev basal to mid inf HK to AK-->Med Rx.  . Tobacco abuse     a. quit 12/2012.  . Ischemic cardiomyopathy     a. 12/2012 Ech: EF 40-45%, Gr 1 DD.  Marland Kitchen Hyperlipidemia     Past Surgical History  Procedure Laterality Date  . Coronary angioplasty with stent placement  2000's X 2    "1 + 1" (01/06/2013)  . Cardiac catheterization  01/06/2013    Loyce Dys, MS RD LDN Clinical Inpatient Dietitian Pager: [phone] Weekend/After hours pager: [phone]

## 2013-01-28 NOTE — Procedures (Signed)
Central Venous Catheter Insertion Procedure Note [name] [id number] May 20, 1960  Procedure: Insertion of Central Venous Catheter Indications: Assessment of intravascular volume, Drug and/or fluid administration and Frequent blood sampling  Procedure Details Consent: Unable to obtain consent because of emergent medical necessity. Time Out: Verified patient identification, verified procedure, site/side was marked, verified correct patient position, special equipment/implants available, medications/allergies/relevent history reviewed, required imaging and test results available.  Performed  Maximum sterile technique was used including antiseptics, cap, gloves, gown, hand hygiene, mask and sheet. Skin prep: Chlorhexidine; local anesthetic administered A antimicrobial bonded/coated triple lumen catheter was placed in the right internal jugular vein using the Seldinger technique.  Evaluation Blood flow good Complications: No apparent complications Patient did tolerate procedure well. Chest X-ray ordered to verify placement.  CXR: pending  Robert Shelling, PA student, [facility] 01/28/2013, 9:27 AM  US guidance Emergent Shock  [name]. [name], MD, FACP Pgr: [phone] [facility]

## 2013-01-28 NOTE — Progress Notes (Addendum)
ANTIBIOTIC CONSULT NOTE - INITIAL  Pharmacy Consult for Vancomycin, Zosyn, Fluconazol Indication: rule out sepsis  No Known Allergies  Patient Measurements: Height: 6\' 1"  (185.4 cm) Weight: 243 lb 8 oz (110.451 kg) IBW/kg (Calculated) : 79.9 Adjusted Body Weight: 89kg  Vital Signs: Temp: 98.5 F (36.9 C) (10/15 0411) BP: 118/102 mmHg (10/15 0746) Pulse Rate: 142 (10/15 0746) Intake/Output from previous day: 10/14 0701 - 10/15 0700 In: 600 [P.O.:600] Out: 325 [Urine:325] Intake/Output from this shift:    Labs:  Recent Labs  01/26/13 0355 01/27/13 0500 01/28/13 0440  WBC 10.5 10.5 11.5*  HGB 11.1* 11.1* 10.2*  PLT 288 361 392  CREATININE 0.74 0.88 1.16   Estimated Creatinine Clearance: 98.1 ml/min (by C-G formula based on Cr of 1.16). No results found for this basename: VANCOTROUGH, Leodis Binet, VANCORANDOM, GENTTROUGH, GENTPEAK, GENTRANDOM, TOBRATROUGH, TOBRAPEAK, TOBRARND, AMIKACINPEAK, AMIKACINTROU, AMIKACIN,  in the last 72 hours   Microbiology: Recent Results (from the past 720 hour(s))  MRSA PCR SCREENING     Status: None   Collection Time    01/05/13  9:58 PM      Result Value Range Status   MRSA by PCR NEGATIVE  NEGATIVE Final   Comment:            The GeneXpert MRSA Assay (FDA     approved for NASAL specimens     only), is one component of a     comprehensive MRSA colonization     surveillance program. It is not     intended to diagnose MRSA     infection nor to guide or     monitor treatment for     MRSA infections.  MRSA PCR SCREENING     Status: None   Collection Time    01/21/13  4:57 PM      Result Value Range Status   MRSA by PCR NEGATIVE  NEGATIVE Final   Comment:            The GeneXpert MRSA Assay (FDA     approved for NASAL specimens     only), is one component of a     comprehensive MRSA colonization     surveillance program. It is not     intended to diagnose MRSA     infection nor to guide or     monitor treatment for     MRSA  infections.  BODY FLUID CULTURE     Status: None   Collection Time    01/24/13 10:20 AM      Result Value Range Status   Specimen Description PERICARDIAL FLUID   Final   Special Requests NONE   Final   Gram Stain     Final   Value: ABUNDANT WBC PRESENT, PREDOMINANTLY PMN     NO ORGANISMS SEEN     Performed at [facility]   Culture     Final   Value: NO GROWTH 2 DAYS     Performed at [facility]   Report Status PENDING   Incomplete    Medical History: Past Medical History  Diagnosis Date  . Coronary artery disease     a. s/p MI in 2009 in [place] with stenting of the LCX and LAD;  b. 12/2012 NSTEMI/CAD: LM nl, LAD patent prox stent, LCX 50-70 isr (FFR 0.84), RCA dom, 132m, EF 40-45%, sev basal to mid inf HK to AK-->Med Rx.  . Tobacco abuse     a. quit 12/2012.  . Ischemic cardiomyopathy  a. 12/2012 Ech: EF 40-45%, Gr 1 DD.  Marland Kitchen Hyperlipidemia     Medications:  Prescriptions prior to admission  Medication Sig Dispense Refill  . aspirin EC 81 MG EC tablet Take 1 tablet (81 mg total) by mouth daily.  30 tablet  0  . clopidogrel (PLAVIX) 75 MG tablet Take 1 tablet (75 mg total) by mouth daily.  30 tablet  11  . lisinopril (PRINIVIL,ZESTRIL) 5 MG tablet Take 1 tablet (5 mg total) by mouth daily.  30 tablet  11  . metoprolol tartrate (LOPRESSOR) 25 MG tablet Take 1 tablet (25 mg total) by mouth 2 (two) times daily.  60 tablet  11  . nitroGLYCERIN (NITROSTAT) 0.4 MG SL tablet Place 1 tablet (0.4 mg total) under the tongue every 5 (five) minutes as needed for chest pain.  25 tablet  3  . simvastatin (ZOCOR) 40 MG tablet Take 1 tablet (40 mg total) by mouth at bedtime.  30 tablet  11   Assessment: 52 yo M admitted 01/21/2013  With CP and STE.  Developed acute pericarditis and effusion with tamponode s/p pericardial drain removed 10/14, experienced left arm weakness, and subsequent vagal episode with bloody bowel movement, developed shaking chills, temp 102.5 and hypoxia.     Anticoagulation:  DVT Px> SCDs SQ heparin  ID:  R/o sepsis,  Tm 102.5 10/15 Vanc, Zosyn, Fluconazole Cultures:  10/11 Body Fluid  Card: ACS s/p MI, ASA, plavix  Goal of Therapy:  Vancomycin trough level 15-20 mcg/ml  Plan:  1. Zosyn 3.375 g IV x 1 stat then 3.375 g IV q8h infused over 4h 2. Vancomycin 2 g IV x 1 then 1g IV q12h, watch UOP carefully and adjust 3. Fluconazole 200 mg IV x 1 then 100 mg IV daily 4. Follow up SCr, UOP, cultures, clinical course and adjust as clinically indicated.    Thank you for allowing pharmacy to be a part of this patients care team.  Lovenia Kim Pharm.D., BCPS Clinical Pharmacist 01/28/2013 8:30 AM Pager: [phone] Phone: [phone]  9:31 AM Evolving concern for endocarditis. Changing from Zosyn to Ceftazidime per Dr. [name]

## 2013-01-28 NOTE — Progress Notes (Signed)
ANTIBIOTIC CONSULT NOTE - FOLLOW UP  Pharmacy Consult for Primaxin + Vancomycin + Fluconazole Indication: Empiric pericarditis, r/o sepsis, r/o IE  No Known Allergies  Patient Measurements: Height: 6\' 1"  (185.4 cm) Weight: 243 lb 8 oz (110.451 kg) IBW/kg (Calculated) : 79.9  Vital Signs: Temp: 102 F (38.9 C) (10/15 1100) Temp src: Core (Comment) (10/15 1000) BP: 105/79 mmHg (10/15 1300) Pulse Rate: 110 (10/15 1300) Intake/Output from previous day: 10/14 0701 - 10/15 0700 In: 600 [P.O.:600] Out: 325 [Urine:325] Intake/Output from this shift: Total I/O In: 2745 [I.V.:1000; Blood:1745] Out: 25 [Urine:25]  Labs:  Recent Labs  01/26/13 0355 01/27/13 0500 01/28/13 0440 01/28/13 1059  01/28/13 1722 01/28/13 1753 01/28/13 1836 01/28/13 1858  WBC 10.5 10.5 11.5*  --   --   --   --   --   --   HGB 11.1* 11.1* 10.2*  --   < > 7.6* 7.8* 8.2*  --   PLT 288 361 392  --   --  173  --   --  101*  CREATININE 0.74 0.88 1.16 1.40*  --   --   --   --   --   < > = values in this interval not displayed. Estimated Creatinine Clearance: 81.3 ml/min (by C-G formula based on Cr of 1.4). No results found for this basename: VANCOTROUGH, Leodis Binet, VANCORANDOM, GENTTROUGH, GENTPEAK, GENTRANDOM, TOBRATROUGH, TOBRAPEAK, TOBRARND, AMIKACINPEAK, AMIKACINTROU, AMIKACIN,  in the last 72 hours   Microbiology: Recent Results (from the past 720 hour(s))  MRSA PCR SCREENING     Status: None   Collection Time    01/05/13  9:58 PM      Result Value Range Status   MRSA by PCR NEGATIVE  NEGATIVE Final   Comment:            The GeneXpert MRSA Assay (FDA     approved for NASAL specimens     only), is one component of a     comprehensive MRSA colonization     surveillance program. It is not     intended to diagnose MRSA     infection nor to guide or     monitor treatment for     MRSA infections.  MRSA PCR SCREENING     Status: None   Collection Time    01/21/13  4:57 PM      Result Value Range  Status   MRSA by PCR NEGATIVE  NEGATIVE Final   Comment:            The GeneXpert MRSA Assay (FDA     approved for NASAL specimens     only), is one component of a     comprehensive MRSA colonization     surveillance program. It is not     intended to diagnose MRSA     infection nor to guide or     monitor treatment for     MRSA infections.  BODY FLUID CULTURE     Status: None   Collection Time    01/24/13 10:20 AM      Result Value Range Status   Specimen Description PERICARDIAL FLUID   Final   Special Requests NONE   Final   Gram Stain     Final   Value: ABUNDANT WBC PRESENT, PREDOMINANTLY PMN     NO ORGANISMS SEEN     Performed at [facility]   Culture     Final   Value: NO GROWTH 3 DAYS  Performed at [facility]   Report Status 01/28/2013 FINAL   Final  SURGICAL PCR SCREEN     Status: Abnormal   Collection Time    01/28/13  1:19 PM      Result Value Range Status   MRSA, PCR NEGATIVE  NEGATIVE Final   Staphylococcus aureus POSITIVE (*) NEGATIVE Final   Comment:            The Xpert SA Assay (FDA     approved for NASAL specimens     in patients over 33 years of age),     is one component of     a comprehensive surveillance     program.  Test performance has     been validated by [employer] for patients greater     than or equal to 40 year old.     It is not intended     to diagnose infection nor to     guide or monitor treatment.    Anti-infectives   Start     Dose/Rate Route Frequency Ordered Stop   01/29/13 1100  fluconazole (DIFLUCAN) IVPB 100 mg     100 mg 50 mL/hr over 60 Minutes Intravenous Every 24 hours 01/28/13 0842     01/28/13 2200  vancomycin (VANCOCIN) IVPB 1000 mg/200 mL premix  Status:  Discontinued     1,000 mg 200 mL/hr over 60 Minutes Intravenous Every 12 hours 01/28/13 0842 01/28/13 1319   01/28/13 1400  piperacillin-tazobactam (ZOSYN) IVPB 3.375 g  Status:  Discontinued     3.375 g 12.5 mL/hr over 240 Minutes  Intravenous 3 times per day 01/28/13 0826 01/28/13 0930   01/28/13 1400  cefTAZidime (FORTAZ) 2 g in dextrose 5 % 50 mL IVPB  Status:  Discontinued     2 g 100 mL/hr over 30 Minutes Intravenous 3 times per day 01/28/13 0930 01/28/13 1314   01/28/13 1300  vancomycin (VANCOCIN) 1,500 mg in sodium chloride 0.9 % 250 mL IVPB     1,500 mg 125 mL/hr over 120 Minutes Intravenous To Surgery 01/28/13 1255 01/28/13 1345   01/28/13 1300  cefUROXime (ZINACEF) 1.5 g in dextrose 5 % 50 mL IVPB  Status:  Discontinued     1.5 g 100 mL/hr over 30 Minutes Intravenous To Surgery 01/28/13 1255 01/28/13 1843   01/28/13 1300  cefUROXime (ZINACEF) 750 mg in dextrose 5 % 50 mL IVPB  Status:  Discontinued     750 mg 100 mL/hr over 30 Minutes Intravenous To Surgery 01/28/13 1255 01/28/13 1843   01/28/13 1300  cefUROXime (ZINACEF) 750 mg in dextrose 5 % 50 mL IVPB  Status:  Discontinued     750 mg 100 mL/hr over 30 Minutes Intravenous To Surgery 01/28/13 1258 01/28/13 1320   01/28/13 1200  metroNIDAZOLE (FLAGYL) IVPB 500 mg  Status:  Discontinued     500 mg 100 mL/hr over 60 Minutes Intravenous 4 times per day 01/28/13 1047 01/28/13 1314   01/28/13 0930  [MAR Hold]  fluconazole (DIFLUCAN) IVPB 200 mg     (On MAR Hold since 01/28/13 1330)   200 mg 100 mL/hr over 60 Minutes Intravenous  Once 01/28/13 0826 01/28/13 1337   01/28/13 0830  piperacillin-tazobactam (ZOSYN) IVPB 3.375 g  Status:  Discontinued     3.375 g 12.5 mL/hr over 240 Minutes Intravenous STAT 01/28/13 0826 01/28/13 1314   01/28/13 0830  vancomycin (VANCOCIN) 2,000 mg in sodium chloride 0.9 % 500 mL IVPB  2,000 mg 250 mL/hr over 120 Minutes Intravenous STAT 01/28/13 0826 01/28/13 1207      Assessment: 52 y.o. M who presented to the [facility] on 10/8 with acute pericarditis with pericardial effusion and tamponade -- s/p successful pericardiocentesis on 01/23/13 with removal of drain on 01/27/13. Thi morning the patient was noted to have recurrent  tamponade with a ruptured LV aneurysm and the patient was sent for emergent surgery.  Infectious disease has rounded on this patient and would like the patient to continue on Vancomycin + Fluconazole + Primaxin post-op for empiric coverage. The patient received the pre-op doses of Zinacef and Vancomycin however the post-op doses will be discontinued and replaced with the continuation of the antibiotics recommended by ID. This has been discussed with Gershon Crane, PA -- who agrees with continuing these antibiotics. The patient is now s/p CABG x 1 and patch repair of the LV ruptured aneurysm.   The patient has received a dose of Vancomycin 2g at 1000 today and another 1.5g at 1400 today. Given the large amount of Vancomycin (total of 3.5 g) given -- will plan to check a random Vancomycin level to ensure appropriate clearance prior to entering standard doses. The machine is known to remove some Vancomycin. It can be assumed that the patient likely has therapeutic Vancomycin levels at this time.  The patient received a dose of Fluconazole 200 mg at 1240 today, Zosyn at 0930, and Zinacef 1.5g intra-op at 1400.   Goal of Therapy:  Vancomycin trough level 15-20 mcg/ml Proper antibiotics for infection/cultures adjusted for renal/hepatic function   Plan:  1. Primaxin 500 mg IV x 1 dose now -- will f/u SCr for additional doses 2. Increase Fluconazole to 400 mg IV every 24 hours (next dose due at 1200 on 10/16) 3. No additional Vancomycin at this time -- will obtain a Vancomycin random level this evening and restart standing doses  4. Will continue to follow renal function, culture results, LOT, and antibiotic de-escalation plans   Georgina Pillion, PharmD, BCPS Clinical Pharmacist Pager: [phone] 01/28/2013 9:32 PM

## 2013-01-28 NOTE — Progress Notes (Signed)
At approximately 0545 01/28/2013, pt got up to go to the bathroom and have a bowel movement.  Pt felt lightheaded and dizzy and pulled blue cord.  RN came in and helped pt back to bed.  Pt was very diaphoretic.  Pt denied chest pain.  Pt vitals taken, ECG obtained, Non-rebreather placed at 15L, and Rapid Response called.  Pt O2 mid to low 80s without O2 on then dropped to 72 when non-re breather mask placed on pt.  O2 slow to climb back to 90s.  On call MD for pt notified and informed RN he would look at ECG and would come see the pt.  On call MD explained to pt vasovagal response and that he felt that was what had occurred. Pt placed on Venti mask d/t O2 on Non-re breather at 100.  Pt began to exhibit full body shaking and reported feeling cold.  Temp WNL and patient covered with blankets. Pt then told RN that he could not move his left hand.  Rapid response RN performed a neuro assessment and found pt unable to make fist with left hand or move fingers on left hand; numbness to mid forearm; ataxia when raising left upper extremity.  Code stroke called.  NIH performed by Rapid response RN and pt taken to CT.  Upon return from CT pt reported to RN seeing a "black squiggly line."  Further evaluation by RN showed more prominent on left than right.  Follow up exam by Neurologist showed increased numbness in left arm and decreased peripheral vision on the left side.   Admitting cardiologist paged and made aware.  MDs to see pt and orders to transfer to 2900 and report called.  Prior to transfer pt heart rate increased into 140s and pt shaking increased.  Dr. [place] present and aware.

## 2013-01-28 NOTE — H&P (Signed)
[facility]     Reason for Consult:antibiotic management    Referring Physician: Dr. [name]  Principal Problem:   Acute pericarditis, unspecified Active Problems:   Chest pain   Fever   Pericardial tamponade   CVA (cerebral infarction)   . aminocaproic acid (AMICAR) for OHS   Intravenous To OR  . aminocaproic acid (AMICAR) for OHS   Intravenous To OR  . artificial tears  1 application Both Eyes Q8H  . aspirin EC  81 mg Oral Daily  . bisacodyl  5 mg Oral Once  . cefUROXime (ZINACEF)  IV  1.5 g Intravenous To OR  . cefUROXime (ZINACEF)  IV  750 mg Intravenous To OR  . cefUROXime (ZINACEF)  IV  750 mg Intravenous To OR  . chlorhexidine  60 mL Topical Once  . [START ON 01/29/2013] chlorhexidine  60 mL Topical Once  . clopidogrel  75 mg Oral Daily  . colchicine  0.6 mg Oral BID  . dexmedetomidine  0.1-0.7 mcg/kg/hr Intravenous To OR  . dexmedetomidine  0.1-0.7 mcg/kg/hr Intravenous To OR  . DOPamine  2-20 mcg/kg/min Intravenous To OR  . DOPamine  2-20 mcg/kg/min Intravenous To OR  . epinephrine  0.5-20 mcg/min Intravenous To OR  . epinephrine  0.5-20 mcg/min Intravenous To OR  . [START ON 01/29/2013] fluconazole (DIFLUCAN) IV  100 mg Intravenous Q24H  . fluconazole (DIFLUCAN) IV  200 mg Intravenous Once  . heparin-papaverine-plasmalyte irrigation   Irrigation To OR  . heparin-papaverine-plasmalyte irrigation   Irrigation To OR  . heparin 30,000 units/NS 1000 mL solution for CELLSAVER   Other To OR  . heparin 30,000 units/NS 1000 mL solution for CELLSAVER   Other To OR  . heparin subcutaneous  5,000 Units Subcutaneous Q8H  . indomethacin  50 mg Oral TID WC  . insulin (NOVOLIN-R) infusion   Intravenous To OR  . insulin (NOVOLIN-R) infusion   Intravenous To OR  . magnesium sulfate  40 mEq Other To OR  . magnesium sulfate  40 mEq Other To OR  . [START ON 01/29/2013] metoprolol tartrate  12.5 mg Oral Once  . metoprolol tartrate  25 mg Oral BID    . nitroGLYCERIN  2-200 mcg/min Intravenous To OR  . nitroGLYCERIN  2-200 mcg/min Intravenous To OR  . pantoprazole (PROTONIX) IV  40 mg Intravenous QHS  . perflutren lipid microspheres (DEFINITY) IV suspension      . phenylephrine (NEO-SYNEPHRINE) Adult infusion  30-200 mcg/min Intravenous To OR  . phenylephrine (NEO-SYNEPHRINE) Adult infusion  30-200 mcg/min Intravenous To OR  . potassium chloride  80 mEq Other To OR  . potassium chloride  80 mEq Other To OR  . simvastatin  40 mg Oral QHS  . sodium chloride  3 mL Intravenous Q12H  . vancomycin  1,500 mg Intravenous To OR  . vancomycin  1,000 mg Intravenous Q12H    Recommendations: I will have him continue with vancomycin, imipenem, diflucan for very broad coverage.     Assessment: He has pericarditis, multiple WBCs suggesting infection.  Tamponade, new inferior wall aneurysm, endocarditis likely.  No signs on echo of thrombus.     Antibiotics: multiple  HPI: [name] is a 52 y.o. male with NSTEMI 2 weeks ago and readmitted 10/8 with chest pain, cath with no specific new blockage, other than the occluded RCA noted on initial cath being medically managed.  Dressler's syndrome, required pericardial drain notable for increased WBCs.  Developed left arm weakness, decreased  sensation, blurred vision and CT done, no significant findings.  Developed acute symptoms this am with AMS, hypotension, diaphoresis, now in cardiogenic shock, tamponade, inferior wall aneurysm.  Received vancomycin, zosyn, flagyl, fluconazole.  Blood cultures sent, no growth on pericardial fluid from 10/11.     Review of Systems: unable to obtain  Past Medical History  Diagnosis Date  . Coronary artery disease     a. s/p MI in 2009 in [place] with stenting of the LCX and LAD;  b. 12/2012 NSTEMI/CAD: LM nl, LAD patent prox stent, LCX 50-70 isr (FFR 0.84), RCA dom, 142m, EF 40-45%, sev basal to mid inf HK to AK-->Med Rx.  . Tobacco abuse     a. quit 12/2012.  .  Ischemic cardiomyopathy     a. 12/2012 Ech: EF 40-45%, Gr 1 DD.  Marland Kitchen Hyperlipidemia     History  Substance Use Topics  . Smoking status: Former Smoker -- 1.00 packs/day for 35 years    Types: Cigarettes    Quit date: 01/06/2013  . Smokeless tobacco: Never Used  . Alcohol Use: No    Family History  Problem Relation Age of Onset  . CAD     No Known Allergies  OBJECTIVE: Blood pressure 106/68, pulse 116, temperature 102.6 F (39.2 C), temperature source Oral, resp. rate 18, height 6\' 1"  (1.854 m), weight 243 lb 8 oz (110.451 kg), SpO2 100.00%. General: intubated Skin: no rashes Lungs: diffuse rhonchi Cor: tachy rr, no rub noted Abdomen: soft, nd Ext: no edema  Microbiology: Recent Results (from the past 240 hour(s))  MRSA PCR SCREENING     Status: None   Collection Time    01/21/13  4:57 PM      Result Value Range Status   MRSA by PCR NEGATIVE  NEGATIVE Final   Comment:            The GeneXpert MRSA Assay (FDA     approved for NASAL specimens     only), is one component of a     comprehensive MRSA colonization     surveillance program. It is not     intended to diagnose MRSA     infection nor to guide or     monitor treatment for     MRSA infections.  BODY FLUID CULTURE     Status: None   Collection Time    01/24/13 10:20 AM      Result Value Range Status   Specimen Description PERICARDIAL FLUID   Final   Special Requests NONE   Final   Gram Stain     Final   Value: ABUNDANT WBC PRESENT, PREDOMINANTLY PMN     NO ORGANISMS SEEN     Performed at [facility]   Culture     Final   Value: NO GROWTH 3 DAYS     Performed at [facility]   Report Status 01/28/2013 FINAL   Final    Staci Righter, MD [email or web address] [phone] pager  [phone] cell 01/28/2013, 1:14 PM

## 2013-01-28 NOTE — Progress Notes (Addendum)
TELEMETRY: Reviewed telemetry pt in NSR: Filed Vitals:   01/28/13 0613 01/28/13 0617 01/28/13 0658 01/28/13 0702  BP: 103/66  117/69   Pulse:   118   Temp:      TempSrc:      Resp:      Height:      Weight:      SpO2: 97% 100%  88%    Intake/Output Summary (Last 24 hours) at 01/28/13 0724 Last data filed at 01/28/13 0428  Gross per 24 hour  Intake    600 ml  Output    325 ml  Net    275 ml    SUBJECTIVE Events of this am noted. Patient was up to BR this am. He felt lightheaded and called for help. He was hypotensive. New neurologic findings of decreased sensation and weakness left arm and blurred vision in left visual field. No chest pain or SOB.  LABS: Basic Metabolic Panel:  Recent Labs  16/10/96 0500 01/28/13 0440  NA 134* 132*  K 4.1 3.9  CL 101 98  CO2 20 22  GLUCOSE 114* 119*  BUN 21 26*  CREATININE 0.88 1.16  CALCIUM 7.8* 8.2*   CBC:  Recent Labs  01/27/13 0500 01/28/13 0440  WBC 10.5 11.5*  HGB 11.1* 10.2*  HCT 31.8* 29.1*  MCV 81.1 81.3  PLT 361 392    Radiology/Studies:  Dg Chest 2 View  01/22/2013   *RADIOLOGY REPORT*  Clinical Data: Fever, shortness of breath, chest pain, initial encounter.  CHEST - 2 VIEW  Comparison: 01/05/2013; 03/09/2008  Findings:  Grossly unchanged, enlarged cardiac silhouette and mediastinal contours.  Improved aeration of the lungs.  The lungs appear hyperexpanded with flattening of the bilateral hemidiaphragms and mild diffuse slightly nodular thickening of the coronary interstitium.  Grossly unchanged bilateral infrahilar opacity favored to represent atelectasis or scar.  No new focal airspace opacity.  No evidence of edema.  No pleural effusion or pneumothorax.  Grossly unchanged bones.  IMPRESSION:  Stable findings of borderline cardiomegaly, bronchitic change and perihilar atelectasis/scar without acute cardiopulmonary disease   Original Report Authenticated By: Tacey Ruiz, MD   CT HEAD WITHOUT CONTRAST    TECHNIQUE:  Contiguous axial images were obtained from the base of the skull  through the vertex without intravenous contrast.  COMPARISON: None.  FINDINGS:  No mass lesion. No midline shift. No acute hemorrhage or hematoma.  No extra-axial fluid collections. No evidence of acute infarction.  There is an old left cerebellar hemisphere infarct. There are a few  tiny vague periventricular white matter lucencies most likely  representing chronic small vessel ischemic changes.  Osseous structures are normal.  IMPRESSION:  No acute abnormality. Old left cerebellar infarct. Chronic small  vessel ischemic changes.  Critical Value/emergent results were called by telephone at the time  of interpretation on 01/28/2013 at 7:08 AM to [facility], RN, who  verbally acknowledged these results.  Electronically Signed  By: Geanie Cooley M.D.  On: 01/28/2013 07:09   Ecg: 01/25/13 NSR with inferior infarct. Persistent mild ST elevation diffusely.  PHYSICAL EXAM General: Well developed, well nourished, in no acute distress. Head: Normal Neck: Negative for carotid bruits. JVD not elevated. Lungs: Decreased BS at bases. Breathing is unlabored. Heart: RRR S1 S2 without murmurs, rubs, or gallops.  Abdomen: Soft, non-tender, non-distended with normoactive bowel sounds. Extremities: No clubbing, cyanosis or edema.  Distal pedal pulses are 2+ and equal bilaterally. Neuro: Alert and oriented X 3. Significant left arm weakness. Left  visual field blurring. Psych:  Responds to questions appropriately with a normal affect.  ASSESSMENT AND PLAN: 1. 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 S/p pericardial drain. Removed 01/27/13. Hemodynamically stable.  No fever and WBC decreasing so hopefully inflammation is improving. For follow up Echo today. 2. CAD with recent inferior infarct. Occluded RCA. Patent stents in LAD and LCx. Large akinetic area in inferior wall and  RV. 3. Tobacco abuse. 4. HL. 5. Acute CVA with left arm weakness and left visual field blurring. Neuro seeing. Initial CT without acute change. Plan CTA to see if patient a candidate for interventional therapy. I think TPA would carry a high risk of causing hemorrhage in the pericardium so I would favor an interventional approach. No evidence of atrial fibrillation. Continue ASA and Plavix. Will repeat Echo today to assess pericardial effusion and R/O LV thrombus. Will transfer to 2H step down.  Principal Problem:   Acute pericarditis, unspecified Active Problems:   Chest pain   Fever   Pericardial tamponade    Signed, Alianys Chacko [place] MD,FACC 01/28/2013 7:24 AM   On transfer to the Unit patient started severe shaking chills and became acutely SOB. Fever spiked to 102.5. He became tachycardic with sinus HR to 150 bpm. Unable to oxygenate despite 100% NRB. CCM consulted for acute intubation and assistance with management.   Jaylina Ramdass [place] MD, [facility] 01/28/2013 8:24 AM

## 2013-01-28 NOTE — Progress Notes (Addendum)
52 year old male with recent NSTEMI treated conservatively who was readmitted for pericarditis/Droesssler syndrome with tamponade, s/p pericardiocentesis on 01/23/13 and removal of the drain yesterday.   The patient developed acute left sided weakness this morning. This was followed by diaphoresis, hypotension, fever up to 104 F. The patient was transferred to the unit 2H and required intubation. He was started on Levophed because of hypotension and iv fluids. ECG showed new Q waves with ST elevations in in the inferior leads with reciprocal changes in the lateral leads. Stat echocardiogram was performed bedside. There is a loculated 2 cm pericardial effusion around the right ventricle. There is complete diastolic collapse of the right ventricle, right atrial collapse, 35% variability in the tricuspid inflow and dilated IVC with no respiratory variations. Conclusively, these are signs of a tamponade. There is also a new basal inferior pseudoaneurysm with very thinned walls. After use of echo contrast there was no thrombus identified neither in the aneurysm nor in the LV apex.  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 Plan: aggressive iv fluid, continue pressors, consult CT surgery for pericardial window placement or ruptured pseudoaneurysm repair  Total time spend with this patient: 90 minutes

## 2013-01-28 NOTE — Progress Notes (Signed)
Responded immediately after receiving call from [employer] regarding a by pass add on patient. Volunteer indicated that the family Renato Gails had come to visit with patient and she provided  him with general information regarding presence here.  The Renato Gails later notified family and the patient brother came and got information and left. Staff from 2s-2300 came and spoke with volunteer for update.  I passed this on to the On Call night Chaplain so that our staff is aware that there maybe  a call for family support later.  Another brother is expected to visit.

## 2013-01-28 NOTE — Brief Op Note (Addendum)
      [address]       [place] [address]             [phone]     01/21/2013 - 01/28/2013  6:57 PM  PATIENT:  [name]  52 y.o. male  PRE-OPERATIVE DIAGNOSIS:  pericardial effusion, CAD  POST-OPERATIVE DIAGNOSIS:  pericardial effusion, ruptured LV aneurysm, CAD  PROCEDURE:  Procedure(s):MEDIAN STERNOTOMY CORONARY ARTERY BYPASS GRAFTING (CABG)X1 SVG-OM REPAIR LV  RUPTURED INFEROPOSTERIOR PSEUDOANEURYSM WITH CORE-MATRIX PATCH INTRAOPERATIVE TRANSESOPHAGEAL ECHOCARDIOGRAM [facility] LEFT THIGH  SURGEON:  Surgeon(s): Loreli Slot, MD Loreli Slot, MD Delight Ovens, MD  PHYSICIAN ASSISTANT: WAYNE GOLD PA-C  ANESTHESIA:   general  PATIENT CONDITION:  ICU - intubated and hemodynamically stable.  PRE-OPERATIVE WEIGHT: 110kg  COMPLICATIONS: NO KNOWN  SPECIMEN: CULTURES AND PATHOLOGY  XC= 32 min CPB= 160 min  FINDINGS: Pericardial tamponade with clotted blood Contained ruptured inferoposterior pseudoaneurysm Extensive necrotic myocardium 3 x 5 cm defect, repaired with cor-matrix patch No MR Aneurysm repaired on pump not crossclamped

## 2013-01-28 NOTE — Procedures (Signed)
Intubation Procedure Note [name] [id number] 07/29/1960  Procedure: Intubation Indications: Respiratory insufficiency  Procedure Details Consent: Unable to obtain consent because of emergent medical necessity. Time Out: Verified patient identification, verified procedure, site/side was marked, verified correct patient position, special equipment/implants available, medications/allergies/relevent history reviewed, required imaging and test results available.  Performed  Maximum sterile technique was used including gown, hand hygiene and mask.  MAC and 4    Evaluation Hemodynamic Status: BP stable throughout; O2 sats: stable throughout Patient's Current Condition: toxic Complications: No apparent complications Patient did tolerate procedure well. Chest X-ray ordered to verify placement.  CXR: pending.   Nelda Bucks 01/28/2013  Mcarthur Rossetti. [name], MD, FACP Pgr: [phone] [place]

## 2013-01-28 NOTE — Code Documentation (Signed)
52 yo bm inpt 3W17 that had a vagal episode while having a BM.  Pt was seen by Dr. Adolm Joseph with Cardiology.  Pt then stated he couldn't move his Lt hand and and sensory loss in his Lt arm.  NIH 2 for sensory & ataxia.  Code stroke called 0625, LKW [id number], stroke team [id number], neurologist [phone], pt arrival in CT 0640.  Pt to go for CT angio head & neck

## 2013-01-28 NOTE — Progress Notes (Signed)
Echocardiogram Echocardiogram Transesophageal has been performed.  [name] 01/28/2013, 2:23 PM

## 2013-01-28 NOTE — Progress Notes (Signed)
Pt was received to unit 2 Heart.  Pt is restless, short of breath and spo2 82%.  Pt is on a non rebreather.  CCM was called to evaluate

## 2013-01-29 ENCOUNTER — Inpatient Hospital Stay (HOSPITAL_COMMUNITY): Payer: Medicaid Other

## 2013-01-29 DIAGNOSIS — R079 Chest pain, unspecified: Secondary | ICD-10-CM

## 2013-01-29 LAB — POCT I-STAT 4, (NA,K, GLUC, HGB,HCT)
Glucose, Bld: 119 mg/dL — ABNORMAL HIGH (ref 70–99)
HCT: 23 % — ABNORMAL LOW (ref 39.0–52.0)
Hemoglobin: 7.8 g/dL — ABNORMAL LOW (ref 13.0–17.0)
Potassium: 5 mEq/L (ref 3.5–5.1)
Potassium: 4.4 mEq/L (ref 3.5–5.1)
Sodium: 142 mEq/L (ref 135–145)
Sodium: 142 mEq/L (ref 135–145)

## 2013-01-29 LAB — POCT I-STAT 3, ART BLOOD GAS (G3+)
Acid-Base Excess: 2 mmol/L (ref 0.0–2.0)
Acid-Base Excess: 2 mmol/L (ref 0.0–2.0)
Bicarbonate: 22.6 mEq/L (ref 20.0–24.0)
Bicarbonate: 25.3 mEq/L — ABNORMAL HIGH (ref 20.0–24.0)
O2 Saturation: 88 %
O2 Saturation: 97 %
Patient temperature: 37.6
TCO2: 24 mmol/L (ref 0–100)
TCO2: 26 mmol/L (ref 0–100)
TCO2: 27 mmol/L (ref 0–100)
pCO2 arterial: 45.4 mmHg — ABNORMAL HIGH (ref 35.0–45.0)
pCO2 arterial: 35.5 mmHg (ref 35.0–45.0)
pH, Arterial: 7.305 — ABNORMAL LOW (ref 7.350–7.450)
pO2, Arterial: 50 mmHg — ABNORMAL LOW (ref 80.0–100.0)

## 2013-01-29 LAB — CREATININE, SERUM
Creatinine, Ser: 1.87 mg/dL — ABNORMAL HIGH (ref 0.50–1.35)
GFR calc non Af Amer: 40 mL/min — ABNORMAL LOW (ref >90)

## 2013-01-29 LAB — POCT I-STAT, CHEM 8
BUN: 27 mg/dL — ABNORMAL HIGH (ref 6–23)
BUN: 31 mg/dL — ABNORMAL HIGH (ref 6–23)
Calcium, Ion: 1.08 mmol/L — ABNORMAL LOW (ref 1.12–1.23)
Calcium, Ion: 1.03 mmol/L — ABNORMAL LOW (ref 1.12–1.23)
Chloride: 106 mEq/L (ref 96–112)
Creatinine, Ser: 2 mg/dL — ABNORMAL HIGH (ref 0.50–1.35)
Glucose, Bld: 143 mg/dL — ABNORMAL HIGH (ref 70–99)
Glucose, Bld: 111 mg/dL — ABNORMAL HIGH (ref 70–99)
HCT: 27 % — ABNORMAL LOW (ref 39.0–52.0)
Hemoglobin: 6.1 g/dL — CL (ref 13.0–17.0)
Hemoglobin: 9.2 g/dL — ABNORMAL LOW (ref 13.0–17.0)
Potassium: 4.7 mEq/L (ref 3.5–5.1)
Potassium: 4 mEq/L (ref 3.5–5.1)
Sodium: 144 mEq/L (ref 135–145)
TCO2: 23 mmol/L (ref 0–100)

## 2013-01-29 LAB — URINE CULTURE
Colony Count: NO GROWTH
Culture: NO GROWTH
Special Requests: NORMAL

## 2013-01-29 LAB — PREPARE FRESH FROZEN PLASMA
Unit division: 0
Unit division: 0
Unit division: 0
Unit division: 0
Unit division: 0
Unit division: 0

## 2013-01-29 LAB — GLUCOSE, CAPILLARY
Glucose-Capillary: 144 mg/dL — ABNORMAL HIGH (ref 70–99)
Glucose-Capillary: 137 mg/dL — ABNORMAL HIGH (ref 70–99)
Glucose-Capillary: 128 mg/dL — ABNORMAL HIGH (ref 70–99)
Glucose-Capillary: 126 mg/dL — ABNORMAL HIGH (ref 70–99)
Glucose-Capillary: 140 mg/dL — ABNORMAL HIGH (ref 70–99)
Glucose-Capillary: 151 mg/dL — ABNORMAL HIGH (ref 70–99)
Glucose-Capillary: 107 mg/dL — ABNORMAL HIGH (ref 70–99)

## 2013-01-29 LAB — VANCOMYCIN, RANDOM: Vancomycin Rm: 16.3 ug/mL

## 2013-01-29 LAB — BASIC METABOLIC PANEL
BUN: 28 mg/dL — ABNORMAL HIGH (ref 6–23)
GFR calc Af Amer: 48 mL/min — ABNORMAL LOW (ref >90)
GFR calc non Af Amer: 41 mL/min — ABNORMAL LOW (ref >90)
Potassium: 4.8 mEq/L (ref 3.5–5.1)
Sodium: 142 mEq/L (ref 135–145)

## 2013-01-29 LAB — PREPARE PLATELET PHERESIS
Unit division: 0
Unit division: 0

## 2013-01-29 LAB — MAGNESIUM
Magnesium: 1.9 mg/dL (ref 1.5–2.5)
Magnesium: 2 mg/dL (ref 1.5–2.5)

## 2013-01-29 LAB — CBC
HCT: 23.8 % — ABNORMAL LOW (ref 39.0–52.0)
Hemoglobin: 7.2 g/dL — ABNORMAL LOW (ref 13.0–17.0)
MCH: 29.9 pg (ref 26.0–34.0)
MCHC: 35.6 g/dL (ref 30.0–36.0)
MCHC: 36.1 g/dL — ABNORMAL HIGH (ref 30.0–36.0)
MCV: 82.6 fL (ref 78.0–100.0)
Platelets: 109 10*3/uL — ABNORMAL LOW (ref 150–400)
RBC: 2.41 MIL/uL — ABNORMAL LOW (ref 4.22–5.81)
RBC: 2.88 MIL/uL — ABNORMAL LOW (ref 4.22–5.81)
RDW: 15.5 % (ref 11.5–15.5)
WBC: 14.4 10*3/uL — ABNORMAL HIGH (ref 4.0–10.5)

## 2013-01-29 LAB — BLOOD PRODUCT ORDER (VERBAL) VERIFICATION

## 2013-01-29 LAB — CK TOTAL AND CKMB (NOT AT ARMC)
CK, MB: 28 ng/mL (ref 0.3–4.0)
Relative Index: 6.4 — ABNORMAL HIGH (ref 0.0–2.5)
Total CK: 436 U/L — ABNORMAL HIGH (ref 7–232)

## 2013-01-29 LAB — PREPARE RBC (CROSSMATCH)

## 2013-01-29 MED ORDER — INSULIN DETEMIR 100 UNIT/ML ~~LOC~~ SOLN
30.0000 [IU] | Freq: Once | SUBCUTANEOUS | Status: AC
  Administered 2013-01-29: 30 [IU] via SUBCUTANEOUS
  Filled 2013-01-29: qty 0.3

## 2013-01-29 MED ORDER — FUROSEMIDE 10 MG/ML IJ SOLN
8.0000 mg/h | INTRAVENOUS | Status: DC
  Administered 2013-01-29: 10 mg/h via INTRAVENOUS
  Administered 2013-01-30: 4 mg/h via INTRAVENOUS
  Administered 2013-01-31 – 2013-02-01 (×2): 8 mg/h via INTRAVENOUS
  Filled 2013-01-29 (×4): qty 25

## 2013-01-29 MED ORDER — INSULIN ASPART 100 UNIT/ML ~~LOC~~ SOLN
0.0000 [IU] | SUBCUTANEOUS | Status: DC
  Administered 2013-01-30 (×2): 2 [IU] via SUBCUTANEOUS

## 2013-01-29 MED ORDER — SODIUM BICARBONATE 8.4 % IV SOLN
50.0000 meq | Freq: Once | INTRAVENOUS | Status: AC
  Administered 2013-01-28: 50 meq via INTRAVENOUS

## 2013-01-29 MED ORDER — SODIUM CHLORIDE 0.9 % IV SOLN
2000.0000 mg | Freq: Once | INTRAVENOUS | Status: AC
  Administered 2013-01-29: 2000 mg via INTRAVENOUS
  Filled 2013-01-29: qty 2000

## 2013-01-29 MED ORDER — INSULIN DETEMIR 100 UNIT/ML ~~LOC~~ SOLN
30.0000 [IU] | Freq: Every day | SUBCUTANEOUS | Status: DC
  Administered 2013-01-30 – 2013-01-31 (×2): 30 [IU] via SUBCUTANEOUS
  Filled 2013-01-29 (×3): qty 0.3

## 2013-01-29 MED ORDER — NOREPINEPHRINE BITARTRATE 1 MG/ML IJ SOLN: 0.0000 ug/min | INTRAVENOUS | Status: DC

## 2013-01-29 MED ORDER — SODIUM CHLORIDE 0.9 % IV SOLN
500.0000 mg | Freq: Three times a day (TID) | INTRAVENOUS | Status: DC
  Administered 2013-01-29 – 2013-01-31 (×7): 500 mg via INTRAVENOUS
  Filled 2013-01-29 (×9): qty 500

## 2013-01-29 MED FILL — Cefuroxime Sodium For Inj 750 MG: INTRAMUSCULAR | Qty: 750 | Status: AC

## 2013-01-29 MED FILL — Calcium Chloride Inj 10%: INTRAVENOUS | Qty: 10 | Status: AC

## 2013-01-29 MED FILL — Potassium Chloride Inj 2 mEq/ML: INTRAVENOUS | Qty: 40 | Status: AC

## 2013-01-29 MED FILL — Mannitol IV Soln 20%: INTRAVENOUS | Qty: 500 | Status: AC

## 2013-01-29 MED FILL — Heparin Sodium (Porcine) Inj 1000 Unit/ML: INTRAMUSCULAR | Qty: 120 | Status: AC

## 2013-01-29 MED FILL — Heparin Sodium (Porcine) Inj 1000 Unit/ML: INTRAMUSCULAR | Qty: 30 | Status: AC

## 2013-01-29 MED FILL — Magnesium Sulfate Inj 50%: INTRAMUSCULAR | Qty: 10 | Status: AC

## 2013-01-29 MED FILL — Sodium Bicarbonate IV Soln 8.4%: INTRAVENOUS | Qty: 250 | Status: AC

## 2013-01-29 MED FILL — Electrolyte-R (PH 7.4) Solution: INTRAVENOUS | Qty: 5000 | Status: AC

## 2013-01-29 MED FILL — Sodium Chloride Irrigation Soln 0.9%: Qty: 3000 | Status: AC

## 2013-01-29 MED FILL — Sodium Chloride IV Soln 0.9%: INTRAVENOUS | Qty: 1000 | Status: AC

## 2013-01-29 MED FILL — Lidocaine HCl IV Inj 20 MG/ML: INTRAVENOUS | Qty: 5 | Status: AC

## 2013-01-29 NOTE — Op Note (Signed)
[name], [name] NO.:  [id number]  MEDICAL RECORD NO.:  [id number]  LOCATION:  2S02C                        FACILITY:  [facility]  PHYSICIAN:  Bedelia Person, M.D.        DATE OF BIRTH:  09-07-1960  DATE OF PROCEDURE:  01/28/2013 DATE OF DISCHARGE:                              OPERATIVE REPORT   [name] is a middle-aged male with a recent myocardial infarction, who at this time was exhibiting signs of extensive pericardial effusion and tamponade.  There is a suspicion of a possible left ventricular rupture.  The TEE will be used intraoperatively to diagnose  his condition as well as evaluate for repair post bypass.  The patient has no history of esophageal or gastric pathology.  The patient arrived intubated with an orogastric tube in.  The orogastric tube was used to suction any gastric contents and then remove.  The transesophageal probe was heavily lubricated, placed blindly down the oropharynx with no significant resistance.  They remained in the neutral and flexed position during the bypass.  At the completion of the case, the probe was removed.  There was no evidence of oral or pharyngeal damage.  Prebypass examination revealed a dense pericardial effusion that measured greatest at the atrial level measuring more than 5 cm in length.  It appeared more fluid at the apical level of the heart measuring around 3.2 cm though a significant left pleural effusion was also noted.  The pericardial effusion was concentric around the entire heart.  Left ventricular exam showed the inferior septal and inferior lateral walls with severe hypokinesis and akinesis of the inferior wall itself.  There was a large basilar pseudoaneurysm measuring 4 x 5 cm below but not involving the mitral valve.  There appeared to be good anterior contractility.  The left atrium was normal size.  The inner atrial septum exhibited a very small PFO.  The appendage was clean.  The mitral  valve had a normal appearance with no masses or vegetations. Both leaflets were opening and closing appropriately.  Color Doppler revealed only a trace central mitral regurgitant flow.  The aortic valve had 3 leaflets.  All were opening and closing appropriately.  Color Doppler revealed no aortic insufficiency.  Right heart examination was significantly compromised from the large pericardial effusion, which again was greater at the base from the apex but contractility appeared good.  The tricuspid valve had a normal appearance and function with just a trace central regurgitant flow.  Swan-Ganz catheter was across the valve.  The patient was placed on cardiopulmonary bypass and underwent a single-vessel coronary artery bypass grafting, as well as resection of the pseudoaneurysm with a patch.  At the completion of the bypass, there were numerous air bubbles which were cleared with a left ventricular vent.  The post bypass examination revealed the removal of the extensive pericardial effusion.  The left ventricle now was somewhat reduced in chamber size.  The aneurysm had been resected and a patch was visible and plates.  There was akinesis of the entire inferior septal through the inferior lateral wall.  There was however good dynamic contractility of the anterior septal, anterior, and anterolateral walls. The left  atrium was unchanged in size or appearance.  Mitral valve continued to show normal appearance and function.  Leaflets were opening and closing appropriately and there was no significant change from the preop examination.  Color Doppler did continue to show a trace central regurgitant flow.  The aortic valve also continued to show no change from the preop and prebypass examination with all 3 leaflets opening and closing appropriately and color Doppler showing no aortic insufficiency. The right heart examination was much improved, with the removal of the significant pericardial  effusion.  Again, there were no other new changes in the post bypass examination.          ______________________________ Bedelia Person, M.D.     LK/MEDQ  D:  01/29/2013  T:  01/29/2013  Job:  [id number]

## 2013-01-29 NOTE — Progress Notes (Signed)
TCTS BRIEF SICU PROGRESS NOTE  1 Day Post-Op  S/P Procedure(s) (LRB): CORONARY ARTERY BYPASS GRAFTING (CABG) (N/A) INTRAOPERATIVE TRANSESOPHAGEAL ECHOCARDIOGRAM (N/A) LEFT VENTRICULAR ANEURYSM REPAIR (N/A)   Sedated on vent Failed acute vent wean attempt earlier today NSR w/ stable hemodynamics on low dose levophed O2 sats 100% on 50% FiO2 UOP excellent on lasix drip Labs stable except creatinine up to 2.1  Plan: Continue current plan.  Keep on vent overnight tonight   OWEN,CLARENCE H 01/29/2013 5:53 PM

## 2013-01-29 NOTE — Progress Notes (Signed)
[facility]  Date of Admission:  01/21/2013  Antibiotics: Antibiotics Given (last 72 hours)   Date/Time Action Medication Dose Rate   01/28/13 0930 Given   piperacillin-tazobactam (ZOSYN) IVPB 3.375 g 3.375 g 12.5 mL/hr   01/28/13 1007 Given   vancomycin (VANCOCIN) 2,000 mg in sodium chloride 0.9 % 500 mL IVPB 2,000 mg 250 mL/hr   01/28/13 1345 Given   vancomycin (VANCOCIN) 1,500 mg in sodium chloride 0.9 % 250 mL IVPB 1,500 mg    01/28/13 2321 Given   imipenem-cilastatin (PRIMAXIN) 500 mg in sodium chloride 0.9 % 100 mL IVPB 500 mg 200 mL/hr   01/29/13 0602 Given   vancomycin (VANCOCIN) 2,000 mg in sodium chloride 0.9 % 500 mL IVPB 2,000 mg 250 mL/hr   01/29/13 0654 Given   imipenem-cilastatin (PRIMAXIN) 500 mg in sodium chloride 0.9 % 100 mL IVPB 500 mg 200 mL/hr      Subjective: S/p surgery pod #1  Objective: Temp:  [97.5 F (36.4 C)-100.2 F (37.9 C)] 100.2 F (37.9 C) (10/16 1245) Pulse Rate:  [84-119] 97 (10/16 1345) Resp:  [14-25] 21 (10/16 1345) BP: (83-114)/(51-70) 91/63 mmHg (10/16 1300) SpO2:  [94 %-100 %] 98 % (10/16 1345) Arterial Line BP: (100-141)/(59-84) 118/65 mmHg (10/16 1345) FiO2 (%):  [50 %-100 %] 50 % (10/16 1200) Weight:  [255 lb (115.667 kg)] 255 lb (115.667 kg) (10/16 0500)  General: intubated, responsive Skin: no rashes Lungs: CTA B Cor: RRR Abdomen: soft, nt Ext: no edema Lines: right subclavian c/d/i  Lab Results Lab Results  Component Value Date   WBC 14.9* 01/29/2013   HGB 7.2* 01/29/2013   HCT 20.2* 01/29/2013   MCV 83.8 01/29/2013   PLT 109* 01/29/2013    Lab Results  Component Value Date   CREATININE 1.83* 01/29/2013   BUN 28* 01/29/2013   NA 142 01/29/2013   K 4.8 01/29/2013   CL 105 01/29/2013   CO2 26 01/29/2013    Lab Results  Component Value Date   ALT 24 01/22/2013   AST 34 01/22/2013   ALKPHOS 156* 01/22/2013   BILITOT 0.8 01/22/2013      Microbiology: Recent Results (from the  past 240 hour(s))  MRSA PCR SCREENING     Status: None   Collection Time    01/21/13  4:57 PM      Result Value Range Status   MRSA by PCR NEGATIVE  NEGATIVE Final   Comment:            The GeneXpert MRSA Assay (FDA     approved for NASAL specimens     only), is one component of a     comprehensive MRSA colonization     surveillance program. It is not     intended to diagnose MRSA     infection nor to guide or     monitor treatment for     MRSA infections.  BODY FLUID CULTURE     Status: None   Collection Time    01/24/13 10:20 AM      Result Value Range Status   Specimen Description PERICARDIAL FLUID   Final   Special Requests NONE   Final   Gram Stain     Final   Value: ABUNDANT WBC PRESENT, PREDOMINANTLY PMN     NO ORGANISMS SEEN     Performed at [facility]   Culture     Final   Value: NO GROWTH 3 DAYS  Performed at [facility]   Report Status 01/28/2013 FINAL   Final  URINE CULTURE     Status: None   Collection Time    01/28/13  8:48 AM      Result Value Range Status   Specimen Description URINE, CATHETERIZED   Final   Special Requests none Normal   Final   Culture  Setup Time     Final   Value: 01/28/2013 16:01     Performed at [facility] Count     Final   Value: NO GROWTH     Performed at [facility]   Culture     Final   Value: NO GROWTH     Performed at [facility]   Report Status 01/29/2013 FINAL   Final  CULTURE, RESPIRATORY (NON-EXPECTORATED)     Status: None   Collection Time    01/28/13 10:24 AM      Result Value Range Status   Specimen Description TRACHEAL ASPIRATE   Final   Special Requests NONE   Final   Gram Stain     Final   Value: FEW WBC PRESENT,BOTH PMN AND MONONUCLEAR     NO SQUAMOUS EPITHELIAL CELLS SEEN     RARE GRAM POSITIVE COCCI     IN PAIRS     Performed at [facility]   Culture     Final   Value: Culture reincubated for better growth     Performed at [facility]   Report Status PENDING   Incomplete  CULTURE, BLOOD (ROUTINE X 2)     Status: None   Collection Time    01/28/13 11:30 AM      Result Value Range Status   Specimen Description BLOOD LEFT ARM   Final   Special Requests BOTTLES DRAWN AEROBIC AND ANAEROBIC 10CC   Final   Culture  Setup Time     Final   Value: 01/28/2013 14:06     Performed at [facility]   Culture     Final   Value:        BLOOD CULTURE RECEIVED NO GROWTH TO DATE CULTURE WILL BE HELD FOR 5 DAYS BEFORE ISSUING A FINAL NEGATIVE REPORT     Performed at [facility]   Report Status PENDING   Incomplete  CULTURE, BLOOD (ROUTINE X 2)     Status: None   Collection Time    01/28/13 11:30 AM      Result Value Range Status   Specimen Description BLOOD RIGHT FOREARM   Final   Special Requests BOTTLES DRAWN AEROBIC AND ANAEROBIC 10CC   Final   Culture  Setup Time     Final   Value: 01/28/2013 14:06     Performed at [facility]   Culture     Final   Value:        BLOOD CULTURE RECEIVED NO GROWTH TO DATE CULTURE WILL BE HELD FOR 5 DAYS BEFORE ISSUING A FINAL NEGATIVE REPORT     Performed at [facility]   Report Status PENDING   Incomplete  SURGICAL PCR SCREEN     Status: Abnormal   Collection Time    01/28/13  1:19 PM      Result Value Range Status   MRSA, PCR NEGATIVE  NEGATIVE Final   Staphylococcus aureus POSITIVE (*) NEGATIVE Final   Comment:            The Xpert  SA Assay (FDA     approved for NASAL specimens     in patients over 90 years of age),     is one component of     a comprehensive surveillance     program.  Test performance has     been validated by [employer] for patients greater     than or equal to 85 year old.     It is not intended     to diagnose infection nor to     guide or monitor treatment.  WOUND CULTURE     Status: None   Collection Time    01/28/13  3:36 PM      Result Value Range Status   Specimen Description WOUND PERICARDIAL   Final    Special Requests PATIENT ON FOLLOWING ZINACEF PERICARDIAL CLOT   Final   Gram Stain     Final   Value: RARE WBC PRESENT,BOTH PMN AND MONONUCLEAR     NO SQUAMOUS EPITHELIAL CELLS SEEN     NO ORGANISMS SEEN     Performed at [facility]   Culture     Final   Value: NO GROWTH 1 DAY     Performed at [facility]   Report Status PENDING   Incomplete  FUNGUS CULTURE W SMEAR     Status: None   Collection Time    01/28/13  3:36 PM      Result Value Range Status   Specimen Description WOUND PERICARDIAL   Final   Special Requests PATIENT ON FOLLOWING ZINACEF PERICARDIAL CLOT   Final   Fungal Smear     Final   Value: NO YEAST OR FUNGAL ELEMENTS SEEN     Performed at [facility]   Culture     Final   Value: CULTURE IN PROGRESS FOR FOUR WEEKS     Performed at [facility]   Report Status PENDING   Incomplete  WOUND CULTURE     Status: None   Collection Time    01/28/13  3:36 PM      Result Value Range Status   Specimen Description WOUND PERICARDIAL   Final   Special Requests PATIENT ON FOLLOWING ZINACEF PERICARDIAL CLOT   Final   Gram Stain     Final   Value: MODERATE WBC PRESENT,BOTH PMN AND MONONUCLEAR     NO SQUAMOUS EPITHELIAL CELLS SEEN     NO ORGANISMS SEEN     Performed at [facility]   Culture     Final   Value: NO GROWTH 1 DAY     Performed at [facility]   Report Status PENDING   Incomplete  FUNGUS CULTURE W SMEAR     Status: None   Collection Time    01/28/13  3:36 PM      Result Value Range Status   Specimen Description WOUND PERICARDIAL   Final   Special Requests PATIENT ON FOLLOWING ZINACEF PERICARDIAL CLOT   Final   Fungal Smear     Final   Value: NO YEAST OR FUNGAL ELEMENTS SEEN     Performed at [facility]   Culture     Final   Value: CULTURE IN PROGRESS FOR FOUR WEEKS     Performed at [facility]   Report Status PENDING   Incomplete  ANAEROBIC CULTURE     Status: None   Collection Time     01/28/13  3:36 PM  Result Value Range Status   Specimen Description WOUND PERICARDIAL   Final   Special Requests PATIENT ON FOLLOWING ZINACEF PERICARDIAL CLOT   Final   Gram Stain     Final   Value: MODERATE WBC PRESENT,BOTH PMN AND MONONUCLEAR     NO SQUAMOUS EPITHELIAL CELLS SEEN     NO ORGANISMS SEEN     Performed at [facility]   Culture     Final   Value: NO ANAEROBES ISOLATED; CULTURE IN PROGRESS FOR 5 DAYS     Performed at [facility]   Report Status PENDING   Incomplete  ANAEROBIC CULTURE     Status: None   Collection Time    01/28/13  3:36 PM      Result Value Range Status   Specimen Description WOUND PERICARDIAL   Final   Special Requests PATIENT ON FOLLOWING ZINACEF PERICARDIAL CLOT   Final   Gram Stain     Final   Value: RARE WBC PRESENT,BOTH PMN AND MONONUCLEAR     NO SQUAMOUS EPITHELIAL CELLS SEEN     NO ORGANISMS SEEN     Performed at [facility]   Culture     Final   Value: NO ANAEROBES ISOLATED; CULTURE IN PROGRESS FOR 5 DAYS     Performed at [facility]   Report Status PENDING   Incomplete  ANAEROBIC CULTURE     Status: None   Collection Time    01/28/13  3:42 PM      Result Value Range Status   Specimen Description TISSUE PERICARDIAL   Final   Special Requests NO 2 PT ON ZINACEF   Final   Gram Stain     Final   Value: FEW WBC PRESENT,BOTH PMN AND MONONUCLEAR     NO ORGANISMS SEEN     Performed at [facility]   Culture     Final   Value: NO ANAEROBES ISOLATED; CULTURE IN PROGRESS FOR 5 DAYS     Performed at [facility]   Report Status PENDING   Incomplete  FUNGUS CULTURE W SMEAR     Status: None   Collection Time    01/28/13  3:42 PM      Result Value Range Status   Specimen Description TISSUE PERICARDIAL   Final   Special Requests NO 2 PT ON ZINACEF   Final   Fungal Smear     Final   Value: NO YEAST OR FUNGAL ELEMENTS SEEN     Performed at [facility]   Culture     Final   Value:  CULTURE IN PROGRESS FOR FOUR WEEKS     Performed at [facility]   Report Status PENDING   Incomplete  TISSUE CULTURE     Status: None   Collection Time    01/28/13  3:42 PM      Result Value Range Status   Specimen Description TISSUE PERICARDIAL   Final   Special Requests NO 2 PT ON ZINACEF   Final   Gram Stain     Final   Value: FEW WBC PRESENT,BOTH PMN AND MONONUCLEAR     NO ORGANISMS SEEN     Performed at [facility]   Culture     Final   Value: NO GROWTH 1 DAY     Performed at [facility]   Report Status PENDING   Incomplete  ANAEROBIC CULTURE     Status: None   Collection  Time    01/28/13  3:48 PM      Result Value Range Status   Specimen Description TISSUE   Final   Special Requests NO 3 EPICARDIUM PT ON ZINACEF   Final   Gram Stain     Final   Value: FEW WBC PRESENT, PREDOMINANTLY PMN     NO ORGANISMS SEEN     Performed at [facility]   Culture     Final   Value: NO ANAEROBES ISOLATED; CULTURE IN PROGRESS FOR 5 DAYS     Performed at [facility]   Report Status PENDING   Incomplete  FUNGUS CULTURE W SMEAR     Status: None   Collection Time    01/28/13  3:48 PM      Result Value Range Status   Specimen Description TISSUE   Final   Special Requests NO 3 EPICARDIUM PT ON ZINACEF   Final   Fungal Smear     Final   Value: NO YEAST OR FUNGAL ELEMENTS SEEN     Performed at [facility]   Culture     Final   Value: CULTURE IN PROGRESS FOR FOUR WEEKS     Performed at [facility]   Report Status PENDING   Incomplete  TISSUE CULTURE     Status: None   Collection Time    01/28/13  3:48 PM      Result Value Range Status   Specimen Description TISSUE   Final   Special Requests NO 3 EPICARDIUM PT ON ZINACEF   Final   Gram Stain     Final   Value: FEW WBC PRESENT, PREDOMINANTLY PMN     NO ORGANISMS SEEN     Performed at [facility]   Culture     Final   Value: NO GROWTH     Performed at [facility]   Report Status PENDING   Incomplete  WOUND CULTURE     Status: None   Collection Time    01/28/13  4:05 PM      Result Value Range Status   Specimen Description WOUND   Final   Special Requests PATIENT ON FOLLOWING ZINACEF LEFT VENTRICULAR CLOT   Final   Gram Stain     Final   Value: MODERATE WBC PRESENT, PREDOMINANTLY PMN     NO SQUAMOUS EPITHELIAL CELLS SEEN     NO ORGANISMS SEEN     Performed at [facility]   Culture     Final   Value: NO GROWTH 1 DAY     Performed at [facility]   Report Status PENDING   Incomplete  FUNGUS CULTURE W SMEAR     Status: None   Collection Time    01/28/13  4:05 PM      Result Value Range Status   Specimen Description WOUND   Final   Special Requests     Final   Value: PATIENT ON FOLLOWING ZINACEF LEFRT VENTRICULAR CLOT   Fungal Smear     Final   Value: NO YEAST OR FUNGAL ELEMENTS SEEN     Performed at [facility]   Culture     Final   Value: CULTURE IN PROGRESS FOR FOUR WEEKS     Performed at [facility]   Report Status PENDING   Incomplete    Studies/Results: Ct Head Wo Contrast  01/28/2013   CLINICAL DATA:  Code stroke.  Weakness.  EXAM:  CT HEAD WITHOUT CONTRAST  TECHNIQUE: Contiguous axial images were obtained from the base of the skull through the vertex without intravenous contrast.  COMPARISON:  None.  FINDINGS: No mass lesion. No midline shift. No acute hemorrhage or hematoma. No extra-axial fluid collections. No evidence of acute infarction.  There is an old left cerebellar hemisphere infarct. There are a few tiny vague periventricular white matter lucencies most likely representing chronic small vessel ischemic changes.  Osseous structures are normal.  IMPRESSION: No acute abnormality. Old left cerebellar infarct. Chronic small vessel ischemic changes.  Critical Value/emergent results were called by telephone at the time of interpretation on 01/28/2013 at 7:08 AM to [facility], RN, who verbally  acknowledged these results.   Electronically Signed   By: Geanie Cooley M.D.   On: 01/28/2013 07:09   Dg Chest Portable 1 View In Am  01/29/2013   CLINICAL DATA:  Post CABG, followup  EXAM: PORTABLE CHEST - 1 VIEW  COMPARISON:  Portable chest x-ray of 01/28/2013  FINDINGS: The lungs are slightly better aerated. There is still cardiomegaly present with basilar atelectasis and probable mild pulmonary vascular congestion. The endotracheal tube is unchanged in position with the tip approximately 3.6 cm above the carinal.  IMPRESSION: Slightly better aeration. Cardiomegaly with probable mild pulmonary vascular congestion and basilar atelectasis.   Electronically Signed   By: Dwyane Dee M.D.   On: 01/29/2013 08:13   Dg Chest Portable 1 View  01/28/2013   CLINICAL DATA:  Postoperative evaluation; cardiomyopathy  EXAM: PORTABLE CHEST - 1 VIEW  COMPARISON:  Study obtained earlier in the day  FINDINGS: Endotracheal tube tip is 3.1 cm above the carinal. There is a mediastinal drain. Swan-Ganz catheter tip is in the main pulmonary outflow tract. There is a catheter arising from below the diaphragm with tip in the region of the right ventricle. Temporary pacemaker wires are attached to the right heart. Nasogastric tube tip and side port are in the stomach. No pneumothorax.  There is cardiomegaly with pulmonary venous hypertension. There is trace edema. No consolidation.  IMPRESSION: Tube and catheter positions as described without pneumothorax. There is a catheter extending from below the diaphragm with the tip overlying the heart, likely in the right ventricle. The exact location of this catheter tip is uncertain on this single view.  There is cardiomegaly with trace edema and pulmonary venous hypertension. No airspace consolidation.   Electronically Signed   By: Bretta Bang M.D.   On: 01/28/2013 21:30   Dg Chest Port 1 View  01/28/2013   CLINICAL DATA:  Right central line placement.  EXAM: PORTABLE CHEST - 1  VIEW  COMPARISON:  01/28/2013 at airway 23 hr.  FINDINGS: Endotracheal tube is in satisfactory position. Nasogastric tube is followed into the stomach with the tip projecting beyond the inferior margin of the image. Interval right IJ central line placement with tip projecting over the SVC.  Heart is enlarged, stable. Mild central pulmonary vascular congestion and/or vascular crowding. Suspect developing left lower lobe airspace disease. No pleural fluid. No pneumothorax.  IMPRESSION: 1. Right IJ central line placement without complicating feature. 2. Central pulmonary vascular congestion versus vascular crowding. 3. Question developing left lower lobe airspace disease.   Electronically Signed   By: Leanna Battles M.D.   On: 01/28/2013 10:00   Dg Chest Port 1 View  01/28/2013   CLINICAL DATA:  Respiratory distress. Intubation.  EXAM: PORTABLE CHEST - 1 VIEW  COMPARISON:  11/22/2012 and 01/05/2013.  FINDINGS: 0823 hr.  Endotracheal tube is in the midtrachea, 4.7 cm above the carina. The heart size is stable at the upper limits of normal for portable AP technique. There are lower lung volumes with mildly increased bibasilar atelectasis. No edema, confluent airspace opacity or pleural effusion is seen.  IMPRESSION: Satisfactorily positioned endotracheal tube. Low lung volumes with mild basilar atelectasis.   Electronically Signed   By: Roxy Horseman M.D.   On: 01/28/2013 08:34    Assessment/Plan: 1) Bacterial pericarditis - + WBCs, drained, cultures ngtd.   - continue with broad spectrum antibiotics  COMER, Molly Maduro, MD [facility] [email or web address] [phone] pager   [phone] cell 01/29/2013, 2:04 PM

## 2013-01-29 NOTE — Progress Notes (Signed)
Attempted to wean pt on simv 4 (per SICU protocol), pt w/ rr at 40, pt agitated.  RN at bedside and aware. Placed pt back on rest mode w/ SIMV/PS per SICU protocol.  MD aware.

## 2013-01-29 NOTE — Progress Notes (Signed)
1 Day Post-Op Procedure(s) (LRB): CORONARY ARTERY BYPASS GRAFTING (CABG) (N/A) INTRAOPERATIVE TRANSESOPHAGEAL ECHOCARDIOGRAM (N/A) LEFT VENTRICULAR ANEURYSM REPAIR (N/A) Subjective: Intubated Denies pain   Objective: Vital signs in last 24 hours: Temp:  [97.5 F (36.4 C)-105.1 F (40.6 C)] 99.7 F (37.6 C) (10/16 0700) Pulse Rate:  [35-154] 103 (10/16 0700) Cardiac Rhythm:  [-] Sinus tachycardia (10/16 0400) Resp:  [14-20] 16 (10/16 0700) BP: (83-157)/(48-93) 101/66 mmHg (10/16 0700) SpO2:  [69 %-100 %] 100 % (10/16 0700) Arterial Line BP: (92-141)/(63-84) 112/72 mmHg (10/16 0700) FiO2 (%):  [18 %-100 %] 60 % (10/16 0400) Weight:  [255 lb (115.667 kg)] 255 lb (115.667 kg) (10/16 0500)  Hemodynamic parameters for last 24 hours: PAP: (40-51)/(13-31) 46/19 mmHg CVP:  [14 mmHg-15 mmHg] 15 mmHg CO:  [6 L/min-6.8 L/min] 6 L/min CI:  [2.5 L/min/m2-2.9 L/min/m2] 2.5 L/min/m2  Intake/Output from previous day: 10/15 0701 - 10/16 0700 In: 16581.3 [I.V.:9171.4; Blood:5229.8; NG/GT:30; IV Piggyback:2150] Out: 6975 [Urine:1305; Blood:4500; Chest Tube:1170] Intake/Output this shift:    General appearance: cooperative Neurologic: left sided weakness Heart: regular rate and rhythm Lungs: rhonchi bilaterally  Lab Results:  Recent Labs  01/28/13 2050  01/29/13 0158 01/29/13 0201  WBC 16.6*  --   --  14.9*  HGB 8.3*  < > 6.1* 7.2*  HCT 23.4*  < > 18.0* 20.2*  PLT 118*  --   --  109*  < > = values in this interval not displayed. BMET:  Recent Labs  01/28/13 1059  01/29/13 0158 01/29/13 0201  NA 136  < > 139 142  K 4.5  < > 4.7 4.8  CL 103  --  106 105  CO2 19  --   --  26  GLUCOSE 165*  < > 143* 145*  BUN 28*  --  27* 28*  CREATININE 1.40*  --  2.00* 1.83*  CALCIUM 7.5*  --   --  7.0*  < > = values in this interval not displayed.  PT/INR:  Recent Labs  01/28/13 2050  LABPROT 20.3*  INR 1.79*   ABG    Component Value Date/Time   PHART 7.472* 01/29/2013 0616   HCO3 25.8* 01/29/2013 0616   TCO2 27 01/29/2013 0616   ACIDBASEDEF 4.0* 01/28/2013 2112   O2SAT 97.0 01/29/2013 0616   CBG (last 3)   Recent Labs  01/28/13 0554 01/28/13 2306  GLUCAP 118* 114*    Assessment/Plan: S/P Procedure(s) (LRB): CORONARY ARTERY BYPASS GRAFTING (CABG) (N/A) INTRAOPERATIVE TRANSESOPHAGEAL ECHOCARDIOGRAM (N/A) LEFT VENTRICULAR ANEURYSM REPAIR (N/A) POD # 1 CV- stable with good hemodynamics after repair ruptured LV pseudoaneurysm  Dc swan  Will need to resume plavix once bleeding risk decreased- 24-48 hours  NEURO- 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 follows commands except no movement of left UE, may be weak in LLE as well but difficult to tell for sure as he is generally weak this AM. Will need PT/OT/ speech once extubated and ready to mobilize  RESP- acute respiratory failure yesterday AM requiring intubation- on 60% O2- will see if we can wean today but may not be ready  RENAL- acute renal failure- began preop in setting of tamponade/ shock. UO is encouraging, creatinine elevated as expected. Continue dopamine  Anemia secondary to ABL- transfuse to Hgb of 8  Coagulopathy- severe- improved follow for signs of bleeding  Thrombocytopenia- received 3 bags of platelets yesterday- PLT > 100K  ENDO- transition to levimir later today  SCD for DVT prophylaxis, no lovenox due to bleeding  risk  Critically ill with multiple organ system failure from pericardial tamponade due to ruptured LV pseudoaneurysm. He is doing as well as could be expected under the circumstances, but still has multiple issues     LOS: 8 days    [name] C 01/29/2013

## 2013-01-29 NOTE — Progress Notes (Signed)
Stroke Team Progress Note  HISTORY [name] is an 52 y.o. male with a history of CAD s/p NSTEMI on 9/23 and admission 10/8 with recurrent chest pain secondary to acute pericarditis and cardiac tamponade (Dressler syndrome). Patient had a drain placed that was removed on the 14th due to decreased drainage. Patient has been doing well other than periods of hypotension until this morning after using the bathroom. At that time complained of being unable to use his left hand. Patient went to head CT and on return complained of difficulty seeing to the left as well.   Date last known well: Date: 01/28/2013  Time last known well: Time: 05:45  tPA Given: No: Patient with recent pericarditis and drainage   01/28/2013 Nursing reported respiratory distress, rigors, patient complained of not being able to move left hand with numbness to mid forearm, ataxia of left arm. CT ordered which showed the old left cerebellar infarct.  01/29/2013 Pericardial tamponade with clotted blood  Contained ruptured inferoposterior pseudoaneurysm  Extensive necrotic myocardium  3 x 5 cm defect, repaired    SUBJECTIVE Patient awake. Follows commands appropriately.     OBJECTIVE Most recent Vital Signs: Filed Vitals:   01/29/13 0800 01/29/13 0813 01/29/13 0815 01/29/13 0830  BP: 105/70     Pulse: 102 105 107 102  Temp: 99.7 F (37.6 C) 99.7 F (37.6 C) 99.7 F (37.6 C) 99.7 F (37.6 C)  TempSrc:      Resp: 15 25 20 16   Height:      Weight:      SpO2: 100% 98% 99% 100%   CBG (last 3)   Recent Labs  01/28/13 0554 01/28/13 2306  GLUCAP 118* 114*    IV Fluid Intake:   . sodium chloride 20 mL/hr at 01/28/13 2100  . sodium chloride 20 mL/hr at 01/28/13 2100  . sodium chloride    . dexmedetomidine 0.5 mcg/kg/hr (01/29/13 0815)  . DOPamine 3 mcg/kg/min (01/29/13 0800)  . furosemide (LASIX) infusion    . insulin (NOVOLIN-R) infusion 2.7 mL/hr at 01/29/13 0800  . lactated ringers 20 mL/hr at  01/28/13 2100  . nitroGLYCERIN Stopped (01/28/13 2100)  . norepinephrine (LEVOPHED) Adult infusion 2 mcg/min (01/29/13 0830)    MEDICATIONS  . acetaminophen  1,000 mg Oral Q6H   Or  . acetaminophen (TYLENOL) oral liquid 160 mg/5 mL  1,000 mg Per Tube Q6H  . acetaminophen (TYLENOL) oral liquid 160 mg/5 mL  650 mg Per Tube Once   Or  . acetaminophen  650 mg Rectal Once  . aminocaproic acid (AMICAR) IVPB  1 g/hr Intravenous Once  . artificial tears  1 application Both Eyes Q8H  . aspirin EC  325 mg Oral Daily   Or  . aspirin  324 mg Per Tube Daily  . bisacodyl  10 mg Oral Daily   Or  . bisacodyl  10 mg Rectal Daily  . docusate sodium  200 mg Oral Daily  . famotidine (PEPCID) IV  20 mg Intravenous Q12H  . fluconazole (DIFLUCAN) IV  400 mg Intravenous Q24H  . imipenem-cilastatin  500 mg Intravenous Q8H  . insulin aspart  0-24 Units Subcutaneous Q4H  . insulin detemir  30 Units Subcutaneous Once  . [START ON 01/30/2013] insulin detemir  30 Units Subcutaneous Daily  . insulin regular  0-10 Units Intravenous TID WC  . magnesium sulfate  4 g Intravenous Once  . metoprolol tartrate  12.5 mg Oral BID   Or  . metoprolol tartrate  12.5 mg Per Tube BID  . [START ON 01/30/2013] pantoprazole  40 mg Oral Daily  . sodium chloride  3 mL Intravenous Q12H   PRN:  albumin human, metoprolol, midazolam, morphine injection, morphine injection, ondansetron (ZOFRAN) IV, oxyCODONE, sodium chloride  Diet:     NPO Activity:  Bedrest DVT Prophylaxis:  SCD  CLINICALLY SIGNIFICANT STUDIES Basic Metabolic Panel:   Recent Labs Lab 01/28/13 1059  01/29/13 0158 01/29/13 0201  NA 136  < > 139 142  K 4.5  < > 4.7 4.8  CL 103  --  106 105  CO2 19  --   --  26  GLUCOSE 165*  < > 143* 145*  BUN 28*  --  27* 28*  CREATININE 1.40*  --  2.00* 1.83*  CALCIUM 7.5*  --   --  7.0*  MG  --   --   --  1.9  < > = values in this interval not displayed. Liver Function Tests:  No results found for this basename:  AST, ALT, ALKPHOS, BILITOT, PROT, ALBUMIN,  in the last 168 hours CBC:   Recent Labs Lab 01/28/13 2050  01/29/13 0158 01/29/13 0201  WBC 16.6*  --   --  14.9*  HGB 8.3*  < > 6.1* 7.2*  HCT 23.4*  < > 18.0* 20.2*  MCV 85.4  --   --  83.8  PLT 118*  --   --  109*  < > = values in this interval not displayed. Coagulation:   Recent Labs Lab 01/28/13 1059 01/28/13 1858 01/28/13 2011 01/28/13 2050  LABPROT 16.4* 26.1* 21.4* 20.3*  INR 1.36 2.49* 1.92* 1.79*   Cardiac Enzymes:  No results found for this basename: CKTOTAL, CKMB, CKMBINDEX, TROPONINI,  in the last 168 hours Urinalysis:   Recent Labs Lab 01/22/13 1132 01/28/13 0847  COLORURINE AMBER* ORANGE*  LABSPEC 1.034* 1.030  PHURINE 5.5 5.0  GLUCOSEU NEGATIVE NEGATIVE  HGBUR NEGATIVE NEGATIVE  BILIRUBINUR SMALL* MODERATE*  KETONESUR 15* 15*  PROTEINUR 30* 30*  UROBILINOGEN 4.0* 4.0*  NITRITE NEGATIVE POSITIVE*  LEUKOCYTESUR NEGATIVE TRACE*   Lipid Panel    Component Value Date/Time   CHOL 164 01/06/2013 0511   TRIG 109 01/06/2013 0511   HDL 44 01/06/2013 0511   CHOLHDL 3.7 01/06/2013 0511   VLDL 22 01/06/2013 0511   LDLCALC 98 01/06/2013 0511   HgbA1C  Lab Results  Component Value Date   HGBA1C 5.7* 01/05/2013    Urine Drug Screen:     Component Value Date/Time   LABOPIA NONE DETECTED 01/05/2013 2053   COCAINSCRNUR NONE DETECTED 01/05/2013 2053   LABBENZ NONE DETECTED 01/05/2013 2053   AMPHETMU NONE DETECTED 01/05/2013 2053   THCU NONE DETECTED 01/05/2013 2053   LABBARB NONE DETECTED 01/05/2013 2053    Alcohol Level: No results found for this basename: ETH,  in the last 168 hours  Ct Head Wo Contrast 01/28/2013   No acute abnormality. Old left cerebellar infarct. Chronic small vessel ischemic changes.    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 The effusion is located anteriorly around the RV free wall. Maximum diameter in diastole is 10 mm. There is no collapse of the right ventricle in diastole.  IVC is dilated with less than 50% respiratory variations. There are no signs of tamponade. There is akinesis of the basal and mid inferior and inferoseptal walls and apical inferior wall. Overall left ventricular ejection fraction, 40-45 % 01/24/2013 A moderate, free-flowing pericardial effusionwas identified circumferential to the heart, predominantly anterior.The fluid contained focal strands. Doppler: The possibility of hemodynamic compromise cannot be excluded on the basis of available images. Significant mitral inflow variation is noted. The IVC does not collapse and the patient is tachycardic. 10/10/2014Left ventricle: Akinesis inferolateral wall. Dyskinesis base inferior wall. The cavity size was normal. Wall thickness was increased in a pattern of mild LVH. The estimated ejection fraction was 35%. - Right ventricle: The cavity size was moderately dilated. Systolic function was moderately reduced. - Right atrium: The atrium was mildly dilated. - Pericardium, extracardiac: Pericardial effusion is only moderate, but it is larger than yesterday. There is some RV collapse. I am concerned about possible tamponade heodynamics.   Carotid Doppler    CXR    Therapy Recommendations   Filed Vitals:   01/29/13 1200  BP: 103/68  Pulse: 101  Temp: 100 F (37.8 C)  Resp: 18   Physical Exam   Patient intubated. Awake follows commands. Shakes head yes, no appropriately. Unable to move right arm/hand. RUE/LLE/RLE movements intact.   ASSESSMENT [name] is a 52 y.o. male presenting with left arm weakness, numbness and left visual field deficits. CT shows no acute stroke. Symptoms secondary to unknown source  but sepsis suspected.  On aspirin 81 mg orally every day and clopidogrel 75 mg orally every day prior to admission. Currently off antithrombotics due to bleeding risk. Work up underway.   Respiratory distress, intubated  Recurrent tamponade with inferiorposterior pseudoaneurysm, necrosis  Sepsis, on IV abx  LDL 98  HGB A1C   5.7  Former smoker  Coronary artery disease, s/p stent  Hospital day # 8  TREATMENT/PLAN  MRI/A Brain when patient stabilizes; hopefully within next 24-48 hours.  Restart plavix when stable from Operative standpoint.  Risk factor modification  Gwendolyn Lima. Manson Passey, [facility], MBA, MHA [facility] Pager: [phone] 01/29/2013 12:40 PM  I have personally obtained a history, examined the patient, evaluated imaging results, and formulated the assessment and plan of care. I agree with the above. Delia Heady, MD

## 2013-01-29 NOTE — Progress Notes (Addendum)
ANTIBIOTIC CONSULT NOTE - FOLLOW UP  Pharmacy Consult for vancomycin and imipenem Indication: pericarditis, r/o sepsis, r/o IE  Labs:  Recent Labs  01/28/13 0440 01/28/13 1059  01/28/13 1858 01/28/13 2050 01/28/13 2113 01/29/13 0158 01/29/13 0201  WBC 11.5*  --   --   --  16.6*  --   --  14.9*  HGB 10.2*  --   < >  --  8.3* 7.5* 6.1* 7.2*  PLT 392  --   < > 101* 118*  --   --  109*  CREATININE 1.16 1.40*  --   --   --   --  2.00* 1.83*  < > = values in this interval not displayed. Estimated Creatinine Clearance: 62.2 ml/min (by C-G formula based on Cr of 1.83).  Recent Labs  01/29/13 0415  VANCORANDOM 16.3     Assessment: 52yo male received 3500mg  of vancomycin yesterday within four hours prior to OR, in critical condition, random level at therapeutic goal ~14hr after last dose given.  Goal of Therapy:  Vancomycin trough level 15-20 mcg/ml  Plan:  Pt required additional dose now to prevent subtherapeutic trough; however, pt's SCr has jumped from 0.74 on 10/13, 1.16-->1.4 on 10/15, now up to 1.83; given erratic changes in SCr will give vancomycin 2000mg  IV IV x1 now and continue to monitor prior to ordering scheduled dose.  Will continue Primaxin with 500mg  IV Q8H for now and monitor CrCl for adjustments if needed.  Vernard Gambles, PharmD, BCPS  01/29/2013,5:39 AM

## 2013-01-29 NOTE — Op Note (Signed)
NAMEDAVYN, MORANDI NO.:  [id number]  MEDICAL RECORD NO.:  [id number]  LOCATION:  2S02C                        FACILITY:  [facility]  PHYSICIAN:  Salvatore Decent. Dorris Fetch, M.D.DATE OF BIRTH:  10-21-1960  DATE OF PROCEDURE:  01/28/2013 DATE OF DISCHARGE:                              OPERATIVE REPORT   PREOPERATIVE DIAGNOSIS:  Pericardial tamponade likely due to ruptured left ventricular pseudoaneurysm.  POSTOPERATIVE DIAGNOSIS:  Pericardial tamponade due to ruptured left ventricular pseudoaneurysm.  PROCEDURE:  Median sternotomy, extracorporeal circulation, repair of ruptured inferoposterior left ventricular pseudoaneurysm with CorMatrix patch, coronary artery bypass grafting x1 (saphenous vein graft to obtuse marginal 1), endoscopic vein harvest, left thigh.  SURGEON:  Salvatore Decent. Dorris Fetch, M.D.  ASSISTANT:  Sheliah Plane, MD.  SECOND ASSISTANT:  Rowe Clack, PA-C.  ANESTHESIA:  General.  FINDINGS:  Transesophageal echocardiography show cardiac tamponade with progression of clot compressing the right ventricle.  Inferoposterior left ventricular pseudoaneurysm.  No mitral regurgitation.  INTRAOPERATIVE FINDINGS:  Large clotted hemopericardium. Contained ruptured inferoposterior pseudoaneurysm, foul smelling necrotic myocardium.  Postbypass transesophageal echocardiography revealed no significant mitral regurgitation.  There was posterior akinesis, but otherwise well-preserved left ventricular function.  Postbypass severe coagulopathy.  CLINICAL NOTE:  Mr. Johnson is a 52 year old gentleman who had recently had an inferior MI due to total occlusion of the right coronary artery. He presented late in the course with this myocardial infarction and no intervention was performed.  He was treated medically.  The patient was readmitted with recurrent chest pain.  He developed a pericardial effusion.  A drain was placed with relief of his tamponade  and subsequently removed when the drainage decreased.  On the morning of surgery, the patient acutely developed stroke symptoms with left arm weakness and left visual field defect.  He then became hemodynamically unstable with profound hypotension and tachycardia.  Repeat echocardiogram showed pericardial tamponade with a complex pericardial effusion.  The patient was initially resuscitated and intubated and we were consulted.  Review of the echocardiogram showed a inferoposterior pseudoaneurysm.  It was a difficult decision given the patient's high fevers and acute neurologic symptoms, but in all likelihood, the patient had a ruptured left ventricular pseudoaneurysm and will be unlikely to survive without emergent surgical intervention.  This was discussed with the patient's family.  The patient's father who is next of kin gave consent to proceed.  OPERATIVE NOTE:  Mr. Conover was brought urgently to the operating room on January 28, 2013.  He was already anesthetized and intubated.  Foley catheter was in place and was draining cloudy bloody urine, there was only a very small amount of urine present.  Anesthesia placed a Swan- Ganz catheter.  Intravenous Zinacef was administered.  The patient was already receiving broad-spectrum antibiotics after his acute hemodynamic collapse.  Transesophageal echocardiography was performed. Please refer to the anesthesiologist's separately dictated note for full details, but it did show the inferoposterior pseudoaneurysm at the base of the heart near the mitral annulus and a large pericardial effusion, which was larger than it had been earlier on the previous echocardiogram.  There was severe RV collapse.  The chest, abdomen, and legs were prepped and draped in the usual sterile fashion.  Right groin incision was made and the femoral artery and vein were dissected out. Simultaneously with this, an incision was made in the medial aspect of the left  leg just above the knee and the greater saphenous vein was identified and harvested endoscopically.  5000 units of heparin was administered and 4-0 Prolene pledgeted pursestring sutures were placed in the artery and vein.  An 18-French arterial cannula was placed in the femoral artery using the Seldinger technique.  Next, a long venous cannula was placed through the pursestring suture in the common femoral vein and advanced into the right atrium under echocardiographic guidance.  These lines were connected to the bypass circuit.  Median sternotomy incision was performed, it was carried through the skin and subcutaneous tissue.  Hemostasis was achieved with cautery.  Prior to dividing the sternum, full cardiopulmonary bypass was instituted. Anticoagulation was confirmed with ACT measurement prior to instituting bypass.  Median sternotomy was performed.  The retractor was placed. The pericardium was tense and distended.  The pericardium was incised. It was thickened.  There was abundant clot within the pericardial space. After creating a pericardial cradle, the clot was removed.  Over the anterior surface of the heart and around the right atrium, there was an inflammatory peel on the epicardium, which was taken off as well.  Next, attention was turned to the base of the heart and clot in this area was densely adherent to the heart and when removed there was a large ruptured pseudoaneurysm.  The total size of the defect was approximately 3 x 5 cm once necrotic muscle was debrided.  A weighted vent line was placed into the left ventricle.  A left ventricular vent then was placed via pursestring suture in the right superior pulmonary vein as well. The necrotic muscle was foul smelling.  It was debrided.  The chorda tendineae and papillary muscles were intact.  The rupture was just on the ventricular side of the mitral annulus.  Patch repair then was performed with 2-0 Ethibond horizontal  mattress sutures with Teflon felt pledgets and a 4 x 7 cm CorMatrix patch.  The sutures were placed initially from the endocardial surface ensuring that viable endocardium and epicardium were taken with each suture then were passed through the CorMatrix which was lowered into place and then Teflon felt pledgets were applied on the outside as well and the sutures were sequentially tied.  De-airing was performed prior to tying the last suture.  At this point, the heart was elevated and the lateral wall was inspected.  The obtuse marginal was identified, this was difficult at first due to the overlying inflammatory peel.  The vessel did appear suitable for grafting.  The saphenous vein was prepared.  An antegrade cardioplegic cannula was placed via pursestring suture in the ascending aorta.  The aorta was crossclamped.  Cardiac arrest was achieved with a combination of cold antegrade blood cardioplegia and topical iced saline.  750 mL of cardioplegia was administered.  There was a rapid diastolic arrest and myocardial septal cooling to less than 10 degrees Celsius.  A reverse saphenous vein graft then was anastomosed end-to-side to obtuse marginal 1 with a running 7-0 Prolene suture.  The vein was of good quality.  OM 1 was a 1.5 mm fair quality target vessel.  There was excellent flow through the graft with cardioplegia administration, and good hemostasis.  The vein graft was cut to length and the cardioplegia cannula was removed from the ascending aorta.  The proximal vein  graft anastomosis was performed to a 4.5 mm punch aortotomy with a running 6-0 Prolene suture. The patient was placed in Trendelenburg position.  Lidocaine was administered.  The left ventricle and aortic root were de-aired.  The aortic crossclamp was removed.  Total crossclamp time was 32 minutes. The patient initially fibrillated and required single defibrillation with 10 joules.  The patient had been maintained  near normothermia.  Final rewarming was performed to achieve 37 degrees Celsius.  A dopamine infusion was initiated at 3 mcg per kg per minute and the patient was loaded with milrinone and then a milrinone infusion was initiated at 0.25 mcg/kg per minute.  Epicardial pacing wires were placed on the right ventricle and right atrium.  The patient was initially DDD paced, but this was later stopped when the patient's native heart rate exceeded the pacemaker rate.  Additional de-airing was performed via the left ventricular apex. The patient weaned from cardiopulmonary bypass on the first attempt with a total bypass time of 160 minutes.  The initial cardiac index was greater than 2.5 L/minute/m2.  Postbypass transesophageal echocardiography revealed no evidence of leak from the patch.  There was posterior akinesis.  There was no mitral regurgitation.  A test dose of protamine was administered and was well tolerated.  The remainder of the protamine was administered without incident.  The femoral venous and arterial cannulae were removed and the sutures were tied.  There was a good pulse and a good Doppler signal in the femoral artery distal to the cannulation site.  This wound was packed at this time, but later closed with a 2-0 Vicryl subcutaneous suture and a 3-0 Vicryl subcuticular suture.  There was ongoing coagulopathic bleeding. The patient was given FFP and platelets as well as packed red blood cells.  Bleeding improved, but the patient remained coagulopathic.  It was elected to place chest tubes and close the chest while correcting the coagulation abnormalities.  A 32-French Blake drain was placed in the pericardium along the diaphragmatic surface and a 36-French chest tube was placed anteriorly in the mediastinum.  No attempt was made to close the pericardium.  The sternum was closed with interrupted heavy gauge stainless steel wires.  Pectoralis fascia, subcutaneous tissue, and skin  were closed in standard fashion.  The patient was observed in the operating room for approximately an hour.  Initially there was severe coagulopathic bleeding, but with continued resuscitation with FFP, platelets, and packed red blood cells, the bleeding improved and the patient was felt stable enough to transport to the surgical intensive care unit.  All sponge, needle, and instrument counts were correct at the end of the procedure.     Salvatore Decent Dorris Fetch, M.D.     SCH/MEDQ  D:  01/28/2013  T:  01/29/2013  Job:  [id number]

## 2013-01-30 ENCOUNTER — Inpatient Hospital Stay (HOSPITAL_COMMUNITY): Payer: Medicaid Other

## 2013-01-30 ENCOUNTER — Encounter (HOSPITAL_COMMUNITY): Payer: Self-pay | Admitting: Thoracic Surgery (Cardiothoracic Vascular Surgery)

## 2013-01-30 LAB — BASIC METABOLIC PANEL
BUN: 34 mg/dL — ABNORMAL HIGH (ref 6–23)
CO2: 28 mEq/L (ref 19–32)
Calcium: 7.4 mg/dL — ABNORMAL LOW (ref 8.4–10.5)
Calcium: 7.7 mg/dL — ABNORMAL LOW (ref 8.4–10.5)
Creatinine, Ser: 1.78 mg/dL — ABNORMAL HIGH (ref 0.50–1.35)
Creatinine, Ser: 1.42 mg/dL — ABNORMAL HIGH (ref 0.50–1.35)
GFR calc non Af Amer: 42 mL/min — ABNORMAL LOW (ref >90)
GFR calc non Af Amer: 56 mL/min — ABNORMAL LOW (ref >90)
Glucose, Bld: 143 mg/dL — ABNORMAL HIGH (ref 70–99)
Glucose, Bld: 86 mg/dL (ref 70–99)
Potassium: 3.9 mEq/L (ref 3.5–5.1)
Potassium: 3.2 mEq/L — ABNORMAL LOW (ref 3.5–5.1)
Sodium: 145 mEq/L (ref 135–145)
Sodium: 142 mEq/L (ref 135–145)

## 2013-01-30 LAB — GLUCOSE, CAPILLARY
Glucose-Capillary: 118 mg/dL — ABNORMAL HIGH (ref 70–99)
Glucose-Capillary: 108 mg/dL — ABNORMAL HIGH (ref 70–99)
Glucose-Capillary: 119 mg/dL — ABNORMAL HIGH (ref 70–99)
Glucose-Capillary: 130 mg/dL — ABNORMAL HIGH (ref 70–99)
Glucose-Capillary: 149 mg/dL — ABNORMAL HIGH (ref 70–99)
Glucose-Capillary: 97 mg/dL (ref 70–99)

## 2013-01-30 LAB — POCT I-STAT 3, ART BLOOD GAS (G3+)
Acid-Base Excess: 5 mmol/L — ABNORMAL HIGH (ref 0.0–2.0)
Acid-Base Excess: 6 mmol/L — ABNORMAL HIGH (ref 0.0–2.0)
Bicarbonate: 28.8 mEq/L — ABNORMAL HIGH (ref 20.0–24.0)
Bicarbonate: 29.3 mEq/L — ABNORMAL HIGH (ref 20.0–24.0)
O2 Saturation: 93 %
Patient temperature: 99.8
TCO2: 30 mmol/L (ref 0–100)
pCO2 arterial: 36.4 mmHg (ref 35.0–45.0)
pH, Arterial: 7.503 — ABNORMAL HIGH (ref 7.350–7.450)
pH, Arterial: 7.516 — ABNORMAL HIGH (ref 7.350–7.450)
pO2, Arterial: 64 mmHg — ABNORMAL LOW (ref 80.0–100.0)
pO2, Arterial: 63 mmHg — ABNORMAL LOW (ref 80.0–100.0)

## 2013-01-30 LAB — WOUND CULTURE
Culture: NO GROWTH
Culture: NO GROWTH
Culture: NO GROWTH

## 2013-01-30 LAB — CULTURE, RESPIRATORY W GRAM STAIN

## 2013-01-30 LAB — CBC
MCH: 30.1 pg (ref 26.0–34.0)
MCV: 85.1 fL (ref 78.0–100.0)
Platelets: 164 10*3/uL (ref 150–400)
RBC: 2.69 MIL/uL — ABNORMAL LOW (ref 4.22–5.81)
RDW: 16.1 % — ABNORMAL HIGH (ref 11.5–15.5)
WBC: 17.6 10*3/uL — ABNORMAL HIGH (ref 4.0–10.5)

## 2013-01-30 LAB — VANCOMYCIN, RANDOM: Vancomycin Rm: 13.8 ug/mL

## 2013-01-30 LAB — PRO B NATRIURETIC PEPTIDE: Pro B Natriuretic peptide (BNP): 4249 pg/mL — ABNORMAL HIGH (ref 0–125)

## 2013-01-30 MED ORDER — POTASSIUM CHLORIDE 10 MEQ/50ML IV SOLN
INTRAVENOUS | Status: AC
  Filled 2013-01-30: qty 150

## 2013-01-30 MED ORDER — POTASSIUM CHLORIDE 10 MEQ/50ML IV SOLN
10.0000 meq | INTRAVENOUS | Status: AC
  Administered 2013-01-30 (×3): 10 meq via INTRAVENOUS
  Filled 2013-01-30: qty 50

## 2013-01-30 MED ORDER — DEXMEDETOMIDINE HCL IN NACL 400 MCG/100ML IV SOLN
0.1000 ug/kg/h | INTRAVENOUS | Status: DC
  Administered 2013-01-30: 0.5 ug/kg/h via INTRAVENOUS
  Filled 2013-01-30: qty 100

## 2013-01-30 MED ORDER — SODIUM CHLORIDE 0.9 % IV BOLUS (SEPSIS): 1000.0000 mL | Freq: Once | INTRAVENOUS | Status: DC

## 2013-01-30 MED ORDER — VANCOMYCIN HCL 10 G IV SOLR
1750.0000 mg | INTRAVENOUS | Status: DC
  Administered 2013-01-30: 1750 mg via INTRAVENOUS
  Filled 2013-01-30 (×2): qty 1750

## 2013-01-30 MED ORDER — ENOXAPARIN SODIUM 40 MG/0.4ML ~~LOC~~ SOLN
40.0000 mg | SUBCUTANEOUS | Status: DC
  Administered 2013-01-30 – 2013-02-04 (×6): 40 mg via SUBCUTANEOUS
  Filled 2013-01-30 (×6): qty 0.4

## 2013-01-30 MED ORDER — POTASSIUM CHLORIDE 10 MEQ/50ML IV SOLN
10.0000 meq | INTRAVENOUS | Status: AC
  Administered 2013-01-30 (×3): 10 meq via INTRAVENOUS

## 2013-01-30 NOTE — Progress Notes (Signed)
NUTRITION FOLLOW UP  Intervention:   PO diet advancement vs need for short-term EN support RD to follow for nutrition care plan, add/manage interventions accordingly  Nutrition Dx:   Inadequate oral intake related to inability to eat, s/p extubation as evidenced by NPO status, ongoing  Goal:   Pt to meet >/= 90% of their estimated nutrition needs, currently unmet  Monitor:   PO diet advancement, EN initiation, weight, labs, I/O's  Assessment:   Patient admitted with recurrent chest pain, found to have STEMI; s/p drain placement for pericardial effusion, removed (10/14); Neurology consulted due to L arm weakness and numbness; code stroke 10/15 AM -- CT showed the old left cerebellar infarct.  Patient s/p procedure 10/16: CORONARY ARTERY BYPASS GRAFTING (CABG) REPAIR LV RUPTURED INFEROPOSTERIOR PSEUDOANEURYSM WITH CORE-MATRIX PATCH  INTRAOPERATIVE TRANSESOPHAGEAL ECHOCARDIOGRAM  [facility] LEFT THIGH  Patient extubated this AM.  Remains NPO post extubation.  RD questions patient's ability to take PO's vs need for short-term enteral nutrition support.   Height: Ht Readings from Last 1 Encounters:  01/21/13 6\' 1"  (1.854 m)    Weight Status:   Wt Readings from Last 1 Encounters:  01/30/13 249 lb 9.6 oz (113.218 kg)    Re-estimated needs:  Kcal: 2200-2400 Protein: 120-130 gm Fluid: 2.2-2.4 L  Skin: chest & leg surgical incision   Diet Order: NPO   Intake/Output Summary (Last 24 hours) at 01/30/13 1407 Last data filed at 01/30/13 1400  Gross per 24 hour  Intake   2502 ml  Output   6810 ml  Net  -4308 ml    Labs:   Recent Labs Lab 01/28/13 1059  01/29/13 0158 01/29/13 0201 01/29/13 1700 01/29/13 1712 01/30/13 0400  NA 136  < > 139 142  --  144 145  K 4.5  < > 4.7 4.8  --  4.0 3.9  CL 103  --  106 105  --  103 107  CO2 19  --   --  26  --   --  27  BUN 28*  --  27* 28*  --  31* 36*  CREATININE 1.40*  --  2.00* 1.83* 1.87* 2.10* 1.78*  CALCIUM 7.5*  --   --  7.0*   --   --  7.4*  MG  --   --   --  1.9 2.0  --   --   GLUCOSE 165*  < > 143* 145*  --  111* 143*  < > = values in this interval not displayed.  CBG (last 3)   Recent Labs  01/30/13 0345 01/30/13 0741 01/30/13 1130  GLUCAP 135* 149* 97    Scheduled Meds: . acetaminophen  1,000 mg Oral Q6H   Or  . acetaminophen (TYLENOL) oral liquid 160 mg/5 mL  1,000 mg Per Tube Q6H  . artificial tears  1 application Both Eyes Q8H  . aspirin EC  325 mg Oral Daily   Or  . aspirin  324 mg Per Tube Daily  . bisacodyl  10 mg Oral Daily   Or  . bisacodyl  10 mg Rectal Daily  . docusate sodium  200 mg Oral Daily  . enoxaparin (LOVENOX) injection  40 mg Subcutaneous Q24H  . fluconazole (DIFLUCAN) IV  400 mg Intravenous Q24H  . imipenem-cilastatin  500 mg Intravenous Q8H  . insulin aspart  0-24 Units Subcutaneous Q4H  . insulin detemir  30 Units Subcutaneous Daily  . insulin regular  0-10 Units Intravenous TID WC  . magnesium sulfate  4 g Intravenous Once  . metoprolol tartrate  12.5 mg Oral BID   Or  . metoprolol tartrate  12.5 mg Per Tube BID  . pantoprazole  40 mg Oral Daily  . sodium chloride  3 mL Intravenous Q12H  . vancomycin  1,750 mg Intravenous Q24H    Continuous Infusions: . sodium chloride 20 mL/hr at 01/28/13 2100  . sodium chloride 20 mL/hr at 01/28/13 2100  . sodium chloride    . dexmedetomidine Stopped (01/30/13 0930)  . DOPamine 3 mcg/kg/min (01/30/13 1400)  . furosemide (LASIX) infusion 4 mg/hr (01/30/13 1400)  . insulin (NOVOLIN-R) infusion Stopped (01/29/13 2200)  . lactated ringers 20 mL/hr at 01/28/13 2100  . nitroGLYCERIN Stopped (01/28/13 2100)  . norepinephrine (LEVOPHED) Adult infusion Stopped (01/29/13 1800)    Maureen Chatters, RD, LDN Pager #: [phone] After-Hours Pager #: [phone]

## 2013-01-30 NOTE — Evaluation (Signed)
Clinical/Bedside Swallow Evaluation Patient Details  Name: [name] MRN: [id number] Date of Birth: March 13, 1961  Today's Date: 01/30/2013 Time: 4098-1191 SLP Time Calculation (min): 17 min  Past Medical History:  Past Medical History  Diagnosis Date  . Coronary artery disease     a. s/p MI in 2009 in [place] with stenting of the LCX and LAD;  b. 12/2012 NSTEMI/CAD: LM nl, LAD patent prox stent, LCX 50-70 isr (FFR 0.84), RCA dom, 171m, EF 40-45%, sev basal to mid inf HK to AK-->Med Rx.  . Tobacco abuse     a. quit 12/2012.  . Ischemic cardiomyopathy     a. 12/2012 Ech: EF 40-45%, Gr 1 DD.  Marland Kitchen Hyperlipidemia    Past Surgical History:  Past Surgical History  Procedure Laterality Date  . Coronary angioplasty with stent placement  2000's X 2    "1 + 1" (01/06/2013)  . Cardiac catheterization  01/06/2013  . Coronary artery bypass graft N/A 01/28/2013    Procedure: CORONARY ARTERY BYPASS GRAFTING (CABG);  Surgeon: Loreli Slot, MD;  Location: [facility] OR;  Service: Open Heart Surgery;  Laterality: N/A;  CABG x one, using left greater saphenous vein harvested endoscopically  . Intraoperative transesophageal echocardiogram N/A 01/28/2013    Procedure: INTRAOPERATIVE TRANSESOPHAGEAL ECHOCARDIOGRAM;  Surgeon: Loreli Slot, MD;  Location: [facility] OR;  Service: Open Heart Surgery;  Laterality: N/A;  . Ventricular aneurysm resection N/A 01/28/2013    Procedure: LEFT VENTRICULAR ANEURYSM REPAIR;  Surgeon: Loreli Slot, MD;  Location: [facility] OR;  Service: Open Heart Surgery;  Laterality: N/A;   HPI:  52 year old gentleman comes to the emergency room because of ongoing chest pain. TPMH:  ischemic heart disease, MI.  He was in his usual state of health on Friday, December 19 when he began having substernal chest discomfort. 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  In the emergency room his EKG is nonacute but did show  evidence of the old inferoposterior wall myocardial infarction and his troponin level is significantly elevated at 6.78.  10/15 he developed sudden onset of left arm weakness. He then developed a left visual field defect. He then had a sudden hemodynamic collapse with hypotension and tachycardia. He also spiked a temperature to 104.30F. He required intubation 01/28/09/17 and is requiring high levels of PEEP. A repeat echo showed a complex effusion with RV collapse and tamponade.  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  Underwent emergent surgery for the following:  Median sternotomy, extracorporeal circulation, repair of ruptured inferoposterior left ventricular pseudoaneurysm with CorMatrix patch, coronary artery bypass grafting x1.  CT revealed No acute abnormality. Old left cerebellar infarct.   showed Slightly improved pulmonary vascular congestion. Persistent left greater than right basilar atelectasis. Stable tiny left apical pneumothorax versus apical pleural fluid/ thickening.   Assessment / Plan / Recommendation Clinical Impression  SLP proceeded with swallow assessment 5 hours post extubation due to alert state and reports of strong cough and clear vocal quality.  Pt. sitting upright in chair and found to have mildly hoarse vocal quality.  Cough weak likely due to pain of incision.  Dentures not at hospital and pt. attempted to masticate cracker and expectored into tissue (always wears upper plate when eating).  Pharyngeal phase of swallow appeared timely and no suspicion of significant residuals.  No s/s aspiration exhibited.  Recommend Dys 1 (puree) texture and thin liquids.  Requesting RN  place denture when plate arrives and order a Dys 3 (mechanical soft) texture.  SLP will continue to see for safety with po's and upgrade solids when appropriate.  Pills whole in applesauce and straws ok.     Aspiration Risk  Mild    Diet Recommendation Dysphagia 1 (Puree);Thin liquid    Liquid Administration via: Cup;Straw Medication Administration: Whole meds with puree Supervision: Patient able to self feed;Full supervision/cueing for compensatory strategies Compensations: Slow rate;Small sips/bites Postural Changes and/or Swallow Maneuvers: Seated upright 90 degrees    Other  Recommendations Oral Care Recommendations: Oral care BID   Follow Up Recommendations  None    Frequency and Duration min 1 x/week  2 weeks   Pertinent Vitals/Pain N/A         Swallow Study         Oral/Motor/Sensory Function Overall Oral Motor/Sensory Function: Appears within functional limits for tasks assessed   Ice Chips Ice chips: Within functional limits   Thin Liquid Thin Liquid: Within functional limits Presentation: Cup;Straw    Nectar Thick Nectar Thick Liquid: Not tested   Honey Thick Honey Thick Liquid: Not tested   Puree Puree: Within functional limits   Solid   GO    Solid: Impaired Oral Phase Impairments: Reduced lingual movement/coordination;Impaired anterior to posterior transit Oral Phase Functional Implications: Oral residue       Breck Coons [place] M.Ed [employer] [phone]  01/30/2013

## 2013-01-30 NOTE — Procedures (Signed)
Extubation Procedure Note  Patient Details:   Name: [name] DOB: [birth year] MRN: [id number]    Pt extubated to [facility] per SICU protocol after successful SBT.  Pt tolerated well.  Pt has good cough and able to vocalize.  Will continue to monitor.  Evaluation  O2 sats: stable throughout Complications: No apparent complications Patient did tolerate procedure well. Bilateral Breath Sounds: Clear Suctioning: Airway Yes  [name] Apple 01/30/2013, 10:10 AM

## 2013-01-30 NOTE — Progress Notes (Signed)
Patient ID: [name], male   DOB: 09-04-1960, 52 y.o.   MRN: [id number]   SICU Evening Rounds:   Hemodynamically stable    Urine output good  CT output low  CBC    Component Value Date/Time   WBC 17.6* 01/30/2013 0400   RBC 2.69* 01/30/2013 0400   HGB 8.1* 01/30/2013 0400   HCT 22.9* 01/30/2013 0400   PLT 164 01/30/2013 0400   MCV 85.1 01/30/2013 0400   MCH 30.1 01/30/2013 0400   MCHC 35.4 01/30/2013 0400   RDW 16.1* 01/30/2013 0400   LYMPHSABS 2.6 03/09/2008 1821   MONOABS 0.5 03/09/2008 1821   EOSABS 0.1 03/09/2008 1821   BASOSABS 0.0 03/09/2008 1821     BMET    Component Value Date/Time   NA 142 01/30/2013 1645   K 3.2* 01/30/2013 1645   CL 104 01/30/2013 1645   CO2 28 01/30/2013 1645   GLUCOSE 86 01/30/2013 1645   BUN 34* 01/30/2013 1645   CREATININE 1.42* 01/30/2013 1645   CALCIUM 7.7* 01/30/2013 1645   GFRNONAA 56* 01/30/2013 1645   GFRAA 65* 01/30/2013 1645     A/P:  Stable postop course. Continue current plans

## 2013-01-30 NOTE — Progress Notes (Signed)
Patient ID: [name], male   DOB: 1960-10-21, 52 y.o.   MRN: [id number]  Plan: Rx imipenem-cilastatin, fluconazole, vancomycin  Assessment: Bacterial pericarditis drained S/P day 2 CABG  Patient was intubated, unable to give history   Objectively:   Vital Signs Report       10/16 0700 10/17 0659 10/17 0700 10/17 0910   Most Recent        Temp (F) 98.8 -  100.2 99.8 -  99.8  99.8 (37.7)  10/17 0739    Pulse 84 -  117 94 -  100  100  10/17 0739    Resp 14 -  36 22 -  22  22  10/17 0739    BP 91/51 -  116/63 115/63 -  115/63  115/63  10/17 0739    Arterial Line BP 96/51 -  147/75 118/64 -  118/64  118/64  10/17 0700    SpO2 (%) 95 -  100 100 -  100  100  10/17 0739    CVS: S1+S2+0 Resp: normal vesicular breathing, wheezing Abdomen: soft, nontender, gs +ve Other systems unremarkable    Selected Labs (Up to last 3 results from past 72 hours) Report       10/16 1700   10/16 1712   10/17 0400      WBC 14.4     17.6      RBC 2.88     2.69      Hemoglobin 8.6   9.2   8.1      HCT 23.8   27.0   22.9      Platelets 136     164     Sodium   144  145     Potassium   4.0  3.9     Chloride   103  107     CO2     27     BUN   31   36      Creatinine 1.87   2.10   1.78      Calcium     7.4      INR          Glucose   111   143         10/15 2011   10/15 2050   10/16 0201     10/16 CXR IMPRESSION:  Slightly improved pulmonary vascular congestion. Persistent left  greater than right basilar atelectasis.  Stable tiny left apical pneumothorax versus apical pleural fluid/  thickening.  Interval removal of Swan-Ganz catheter. Other support apparatus  remain in unchanged position.   Wound culture [[id number]] Collected: 01/28/13 1605 Updated: 01/30/13 0721 Specimen Description WOUND Special Requests Result: PATIENT ON FOLLOWING ZINACEF LEFT VENTRICULAR CLOT Gram Stain - Result: MODERATE WBC PRESENT, PREDOMINANTLY PMN NO SQUAMOUS EPITHELIAL CELLS SEEN NO  ORGANISMS SEEN Performed at [facility] Culture - Result: NO GROWTH 2 DAYS Performed at [facility] Report Status 01/30/2013 FINAL

## 2013-01-30 NOTE — Progress Notes (Signed)
2 Days Post-Op Procedure(s) (LRB): CORONARY ARTERY BYPASS GRAFTING (CABG) (N/A) INTRAOPERATIVE TRANSESOPHAGEAL ECHOCARDIOGRAM (N/A) LEFT VENTRICULAR ANEURYSM REPAIR (N/A) Subjective: Intubated but awake and following commands  Objective: Vital signs in last 24 hours: Temp:  [98.8 F (37.1 C)-100.2 F (37.9 C)] 99.8 F (37.7 C) (10/17 0739) Pulse Rate:  [84-117] 94 (10/17 0700) Cardiac Rhythm:  [-] Sinus tachycardia (10/16 2000) Resp:  [14-36] 22 (10/17 0700) BP: (91-116)/(51-69) 116/63 mmHg (10/17 0330) SpO2:  [95 %-100 %] 100 % (10/17 0700) Arterial Line BP: (96-147)/(51-79) 118/64 mmHg (10/17 0700) FiO2 (%):  [50 %-60 %] 50 % (10/17 0330) Weight:  [249 lb 9.6 oz (113.218 kg)] 249 lb 9.6 oz (113.218 kg) (10/17 0500)  Hemodynamic parameters for last 24 hours: PAP: (38-45)/(13-21) 38/13 mmHg  Intake/Output from previous day: 10/16 0701 - 10/17 0700 In: 2717.5 [I.V.:1725; Blood:352.5; NG/GT:90; IV Piggyback:550] Out: 1610 [Urine:6375; Emesis/NG output:200; Chest Tube:130] Intake/Output this shift: Total I/O In: 100 [IV Piggyback:100] Out: -   General appearance: alert and no distress Neurologic: no movement with left arm Heart: regular rate and rhythm Lungs: diminished breath sounds bibasilar Abdomen: normal findings: mildly distended, nontender  Lab Results:  Recent Labs  01/29/13 1700 01/29/13 1712 01/30/13 0400  WBC 14.4*  --  17.6*  HGB 8.6* 9.2* 8.1*  HCT 23.8* 27.0* 22.9*  PLT 136*  --  164   BMET:  Recent Labs  01/29/13 0201  01/29/13 1712 01/30/13 0400  NA 142  --  144 145  K 4.8  --  4.0 3.9  CL 105  --  103 107  CO2 26  --   --  27  GLUCOSE 145*  --  111* 143*  BUN 28*  --  31* 36*  CREATININE 1.83*  < > 2.10* 1.78*  CALCIUM 7.0*  --   --  7.4*  < > = values in this interval not displayed.  PT/INR:  Recent Labs  01/28/13 2050  LABPROT 20.3*  INR 1.79*   ABG    Component Value Date/Time   PHART 7.472* 01/29/2013 0616   HCO3 25.8*  01/29/2013 0616   TCO2 23 01/29/2013 1712   ACIDBASEDEF 4.0* 01/28/2013 2112   O2SAT 97.0 01/29/2013 0616   CBG (last 3)   Recent Labs  01/29/13 1312 01/29/13 1637 01/30/13 0741  GLUCAP 131* 107* 149*    Assessment/Plan: S/P Procedure(s) (LRB): CORONARY ARTERY BYPASS GRAFTING (CABG) (N/A) INTRAOPERATIVE TRANSESOPHAGEAL ECHOCARDIOGRAM (N/A) LEFT VENTRICULAR ANEURYSM REPAIR (N/A) POD # 2 NEURO- still no motor function in left UE, otherwise appears intact to limited exam  Start PT/OT when extubated  CV_ remains hemodynamically stable  RESP- VDRF in setting of pericardial tamponade and shcok  Failed wean trial yesterday  hoefully will be able to extubate today now that we have gotten a lot of fluid off  RENAL- creatinine peaked at 2.1 yesterday- now down to 1.8 c/w ATN due to shock  Diuresed 6 liters(4 L negative) yesterday  Decrease lasix gtt  Supplement K  Nutrition- start POs once extubated and cleared by speech  DC CT  SCD for DVT prophylaxis, will start lovenox after CT removed  ENDO- CBG well controlled  ID- low grade temps yesterday- 100.15F  WBC up today  All cultures no growth so far, but would continue broad spectrum coverage for now       LOS: 9 days    Xaden Kaufman C 01/30/2013

## 2013-01-30 NOTE — Progress Notes (Signed)
[facility]  Date of Admission:  01/21/2013  Antibiotics: Antibiotics Given (last 72 hours)   Date/Time Action Medication Dose Rate   01/28/13 0930 Given   piperacillin-tazobactam (ZOSYN) IVPB 3.375 g 3.375 g 12.5 mL/hr   01/28/13 1007 Given   vancomycin (VANCOCIN) 2,000 mg in sodium chloride 0.9 % 500 mL IVPB 2,000 mg 250 mL/hr   01/28/13 1345 Given   vancomycin (VANCOCIN) 1,500 mg in sodium chloride 0.9 % 250 mL IVPB 1,500 mg    01/28/13 2321 Given   imipenem-cilastatin (PRIMAXIN) 500 mg in sodium chloride 0.9 % 100 mL IVPB 500 mg 200 mL/hr   01/29/13 0602 Given   vancomycin (VANCOCIN) 2,000 mg in sodium chloride 0.9 % 500 mL IVPB 2,000 mg 250 mL/hr   01/29/13 0654 Given   imipenem-cilastatin (PRIMAXIN) 500 mg in sodium chloride 0.9 % 100 mL IVPB 500 mg 200 mL/hr   01/29/13 1430 Given   imipenem-cilastatin (PRIMAXIN) 500 mg in sodium chloride 0.9 % 100 mL IVPB 500 mg 200 mL/hr   01/29/13 2145 Given   imipenem-cilastatin (PRIMAXIN) 500 mg in sodium chloride 0.9 % 100 mL IVPB 500 mg 200 mL/hr   01/30/13 0706 Given   imipenem-cilastatin (PRIMAXIN) 500 mg in sodium chloride 0.9 % 100 mL IVPB 500 mg 200 mL/hr      Subjective: Now extubated  Objective: Temp:  [98.8 F (37.1 C)-99.8 F (37.7 C)] 99.1 F (37.3 C) (10/17 1128) Pulse Rate:  [92-117] 95 (10/17 1300) Resp:  [12-36] 24 (10/17 1300) BP: (100-125)/(53-69) 125/64 mmHg (10/17 1010) SpO2:  [95 %-100 %] 99 % (10/17 1300) Arterial Line BP: (96-147)/(51-76) 125/70 mmHg (10/17 1300) FiO2 (%):  [40 %-50 %] 50 % (10/17 1100) Weight:  [249 lb 9.6 oz (113.218 kg)] 249 lb 9.6 oz (113.218 kg) (10/17 0500)  General: Alert, responds to commands, facemask on Skin: no rashes Lungs: CTA  Cor: rrr Abdomen: soft, nt, nd Ext: no edema  Lab Results Lab Results  Component Value Date   WBC 17.6* 01/30/2013   HGB 8.1* 01/30/2013   HCT 22.9* 01/30/2013   MCV 85.1 01/30/2013   PLT 164 01/30/2013      Lab Results  Component Value Date   CREATININE 1.78* 01/30/2013   BUN 36* 01/30/2013   NA 145 01/30/2013   K 3.9 01/30/2013   CL 107 01/30/2013   CO2 27 01/30/2013    Lab Results  Component Value Date   ALT 24 01/22/2013   AST 34 01/22/2013   ALKPHOS 156* 01/22/2013   BILITOT 0.8 01/22/2013      Microbiology: Recent Results (from the past 240 hour(s))  MRSA PCR SCREENING     Status: None   Collection Time    01/21/13  4:57 PM      Result Value Range Status   MRSA by PCR NEGATIVE  NEGATIVE Final   Comment:            The GeneXpert MRSA Assay (FDA     approved for NASAL specimens     only), is one component of a     comprehensive MRSA colonization     surveillance program. It is not     intended to diagnose MRSA     infection nor to guide or     monitor treatment for     MRSA infections.  BODY FLUID CULTURE     Status: None   Collection Time    01/24/13 10:20 AM  Result Value Range Status   Specimen Description PERICARDIAL FLUID   Final   Special Requests NONE   Final   Gram Stain     Final   Value: ABUNDANT WBC PRESENT, PREDOMINANTLY PMN     NO ORGANISMS SEEN     Performed at [facility]   Culture     Final   Value: NO GROWTH 3 DAYS     Performed at [facility]   Report Status 01/28/2013 FINAL   Final  URINE CULTURE     Status: None   Collection Time    01/28/13  8:48 AM      Result Value Range Status   Specimen Description URINE, CATHETERIZED   Final   Special Requests none Normal   Final   Culture  Setup Time     Final   Value: 01/28/2013 16:01     Performed at [facility] Count     Final   Value: NO GROWTH     Performed at [facility]   Culture     Final   Value: NO GROWTH     Performed at [facility]   Report Status 01/29/2013 FINAL   Final  CULTURE, RESPIRATORY (NON-EXPECTORATED)     Status: None   Collection Time    01/28/13 10:24 AM      Result Value Range Status   Specimen Description  TRACHEAL ASPIRATE   Final   Special Requests NONE   Final   Gram Stain     Final   Value: FEW WBC PRESENT,BOTH PMN AND MONONUCLEAR     NO SQUAMOUS EPITHELIAL CELLS SEEN     RARE GRAM POSITIVE COCCI     IN PAIRS     Performed at [facility]   Culture     Final   Value: Non-Pathogenic Oropharyngeal-type Flora Isolated.     Performed at [facility]   Report Status 01/30/2013 FINAL   Final  CULTURE, BLOOD (ROUTINE X 2)     Status: None   Collection Time    01/28/13 11:30 AM      Result Value Range Status   Specimen Description BLOOD LEFT ARM   Final   Special Requests BOTTLES DRAWN AEROBIC AND ANAEROBIC 10CC   Final   Culture  Setup Time     Final   Value: 01/28/2013 14:06     Performed at [facility]   Culture     Final   Value:        BLOOD CULTURE RECEIVED NO GROWTH TO DATE CULTURE WILL BE HELD FOR 5 DAYS BEFORE ISSUING A FINAL NEGATIVE REPORT     Performed at [facility]   Report Status PENDING   Incomplete  CULTURE, BLOOD (ROUTINE X 2)     Status: None   Collection Time    01/28/13 11:30 AM      Result Value Range Status   Specimen Description BLOOD RIGHT FOREARM   Final   Special Requests BOTTLES DRAWN AEROBIC AND ANAEROBIC 10CC   Final   Culture  Setup Time     Final   Value: 01/28/2013 14:06     Performed at [facility]   Culture     Final   Value:        BLOOD CULTURE RECEIVED NO GROWTH TO DATE CULTURE WILL BE HELD FOR 5 DAYS BEFORE ISSUING A FINAL NEGATIVE REPORT     Performed at  [facility]   Report Status PENDING   Incomplete  SURGICAL PCR SCREEN     Status: Abnormal   Collection Time    01/28/13  1:19 PM      Result Value Range Status   MRSA, PCR NEGATIVE  NEGATIVE Final   Staphylococcus aureus POSITIVE (*) NEGATIVE Final   Comment:            The Xpert SA Assay (FDA     approved for NASAL specimens     in patients over 25 years of age),     is one component of     a comprehensive surveillance      program.  Test performance has     been validated by [employer] for patients greater     than or equal to 40 year old.     It is not intended     to diagnose infection nor to     guide or monitor treatment.  AFB CULTURE WITH SMEAR     Status: None   Collection Time    01/28/13  3:36 PM      Result Value Range Status   Specimen Description WOUND PERICARDIAL   Final   Special Requests PATIENT ON FOLLOWING ZINACEF PERICARDIAL CLOT   Final   ACID FAST SMEAR     Final   Value: NO ACID FAST BACILLI SEEN     Performed at [facility]   Culture     Final   Value: CULTURE WILL BE EXAMINED FOR 6 WEEKS BEFORE ISSUING A FINAL REPORT     Performed at [facility]   Report Status PENDING   Incomplete  WOUND CULTURE     Status: None   Collection Time    01/28/13  3:36 PM      Result Value Range Status   Specimen Description WOUND PERICARDIAL   Final   Special Requests PATIENT ON FOLLOWING ZINACEF PERICARDIAL CLOT   Final   Gram Stain     Final   Value: RARE WBC PRESENT,BOTH PMN AND MONONUCLEAR     NO SQUAMOUS EPITHELIAL CELLS SEEN     NO ORGANISMS SEEN     Performed at [facility]   Culture     Final   Value: NO GROWTH 2 DAYS     Performed at [facility]   Report Status 01/30/2013 FINAL   Final  FUNGUS CULTURE W SMEAR     Status: None   Collection Time    01/28/13  3:36 PM      Result Value Range Status   Specimen Description WOUND PERICARDIAL   Final   Special Requests PATIENT ON FOLLOWING ZINACEF PERICARDIAL CLOT   Final   Fungal Smear     Final   Value: NO YEAST OR FUNGAL ELEMENTS SEEN     Performed at [facility]   Culture     Final   Value: CULTURE IN PROGRESS FOR FOUR WEEKS     Performed at [facility]   Report Status PENDING   Incomplete  AFB CULTURE WITH SMEAR     Status: None   Collection Time    01/28/13  3:36 PM      Result Value Range Status   Specimen Description WOUND PERICARDIAL   Final   Special Requests  PATIENT ON FOLLOWING ZINACEF PERICARDIAL CLOT   Final   ACID FAST SMEAR     Final   Value:  NO ACID FAST BACILLI SEEN     Performed at [facility]   Culture     Final   Value: CULTURE WILL BE EXAMINED FOR 6 WEEKS BEFORE ISSUING A FINAL REPORT     Performed at [facility]   Report Status PENDING   Incomplete  WOUND CULTURE     Status: None   Collection Time    01/28/13  3:36 PM      Result Value Range Status   Specimen Description WOUND PERICARDIAL   Final   Special Requests PATIENT ON FOLLOWING ZINACEF PERICARDIAL CLOT   Final   Gram Stain     Final   Value: MODERATE WBC PRESENT,BOTH PMN AND MONONUCLEAR     NO SQUAMOUS EPITHELIAL CELLS SEEN     NO ORGANISMS SEEN     Performed at [facility]   Culture     Final   Value: NO GROWTH 2 DAYS     Performed at [facility]   Report Status 01/30/2013 FINAL   Final  FUNGUS CULTURE W SMEAR     Status: None   Collection Time    01/28/13  3:36 PM      Result Value Range Status   Specimen Description WOUND PERICARDIAL   Final   Special Requests PATIENT ON FOLLOWING ZINACEF PERICARDIAL CLOT   Final   Fungal Smear     Final   Value: NO YEAST OR FUNGAL ELEMENTS SEEN     Performed at [facility]   Culture     Final   Value: CULTURE IN PROGRESS FOR FOUR WEEKS     Performed at [facility]   Report Status PENDING   Incomplete  ANAEROBIC CULTURE     Status: None   Collection Time    01/28/13  3:36 PM      Result Value Range Status   Specimen Description WOUND PERICARDIAL   Final   Special Requests PATIENT ON FOLLOWING ZINACEF PERICARDIAL CLOT   Final   Gram Stain     Final   Value: MODERATE WBC PRESENT,BOTH PMN AND MONONUCLEAR     NO SQUAMOUS EPITHELIAL CELLS SEEN     NO ORGANISMS SEEN     Performed at [facility]   Culture     Final   Value: NO ANAEROBES ISOLATED; CULTURE IN PROGRESS FOR 5 DAYS     Performed at [facility]   Report Status PENDING   Incomplete    ANAEROBIC CULTURE     Status: None   Collection Time    01/28/13  3:36 PM      Result Value Range Status   Specimen Description WOUND PERICARDIAL   Final   Special Requests PATIENT ON FOLLOWING ZINACEF PERICARDIAL CLOT   Final   Gram Stain     Final   Value: RARE WBC PRESENT,BOTH PMN AND MONONUCLEAR     NO SQUAMOUS EPITHELIAL CELLS SEEN     NO ORGANISMS SEEN     Performed at [facility]   Culture     Final   Value: NO ANAEROBES ISOLATED; CULTURE IN PROGRESS FOR 5 DAYS     Performed at [facility]   Report Status PENDING   Incomplete  AFB CULTURE WITH SMEAR     Status: None   Collection Time    01/28/13  3:42 PM      Result Value Range Status   Specimen Description TISSUE PERICARDIAL   Final  Special Requests NO 2 PT ON ZINACEF   Final   ACID FAST SMEAR     Final   Value: NO ACID FAST BACILLI SEEN     Performed at [facility]   Culture     Final   Value: CULTURE WILL BE EXAMINED FOR 6 WEEKS BEFORE ISSUING A FINAL REPORT     Performed at [facility]   Report Status PENDING   Incomplete  ANAEROBIC CULTURE     Status: None   Collection Time    01/28/13  3:42 PM      Result Value Range Status   Specimen Description TISSUE PERICARDIAL   Final   Special Requests NO 2 PT ON ZINACEF   Final   Gram Stain     Final   Value: FEW WBC PRESENT,BOTH PMN AND MONONUCLEAR     NO ORGANISMS SEEN     Performed at [facility]   Culture     Final   Value: NO ANAEROBES ISOLATED; CULTURE IN PROGRESS FOR 5 DAYS     Performed at [facility]   Report Status PENDING   Incomplete  FUNGUS CULTURE W SMEAR     Status: None   Collection Time    01/28/13  3:42 PM      Result Value Range Status   Specimen Description TISSUE PERICARDIAL   Final   Special Requests NO 2 PT ON ZINACEF   Final   Fungal Smear     Final   Value: NO YEAST OR FUNGAL ELEMENTS SEEN     Performed at [facility]   Culture     Final   Value: CULTURE IN PROGRESS FOR  FOUR WEEKS     Performed at [facility]   Report Status PENDING   Incomplete  TISSUE CULTURE     Status: None   Collection Time    01/28/13  3:42 PM      Result Value Range Status   Specimen Description TISSUE PERICARDIAL   Final   Special Requests NO 2 PT ON ZINACEF   Final   Gram Stain     Final   Value: FEW WBC PRESENT,BOTH PMN AND MONONUCLEAR     NO ORGANISMS SEEN     Performed at [facility]   Culture     Final   Value: NO GROWTH 2 DAYS     Performed at [facility]   Report Status PENDING   Incomplete  AFB CULTURE WITH SMEAR     Status: None   Collection Time    01/28/13  3:48 PM      Result Value Range Status   Specimen Description TISSUE   Final   Special Requests NO 3 EPICARDIUM PT ON ZINACEF   Final   ACID FAST SMEAR     Final   Value: NO ACID FAST BACILLI SEEN     Performed at [facility]   Culture     Final   Value: CULTURE WILL BE EXAMINED FOR 6 WEEKS BEFORE ISSUING A FINAL REPORT     Performed at [facility]   Report Status PENDING   Incomplete  ANAEROBIC CULTURE     Status: None   Collection Time    01/28/13  3:48 PM      Result Value Range Status   Specimen Description TISSUE   Final   Special Requests NO 3 EPICARDIUM PT ON ZINACEF   Final   Gram  Stain     Final   Value: FEW WBC PRESENT, PREDOMINANTLY PMN     NO ORGANISMS SEEN     Performed at [facility]   Culture     Final   Value: NO ANAEROBES ISOLATED; CULTURE IN PROGRESS FOR 5 DAYS     Performed at [facility]   Report Status PENDING   Incomplete  FUNGUS CULTURE W SMEAR     Status: None   Collection Time    01/28/13  3:48 PM      Result Value Range Status   Specimen Description TISSUE   Final   Special Requests NO 3 EPICARDIUM PT ON ZINACEF   Final   Fungal Smear     Final   Value: NO YEAST OR FUNGAL ELEMENTS SEEN     Performed at [facility]   Culture     Final   Value: CULTURE IN PROGRESS FOR FOUR WEEKS     Performed at  [facility]   Report Status PENDING   Incomplete  TISSUE CULTURE     Status: None   Collection Time    01/28/13  3:48 PM      Result Value Range Status   Specimen Description TISSUE   Final   Special Requests NO 3 EPICARDIUM PT ON ZINACEF   Final   Gram Stain     Final   Value: FEW WBC PRESENT, PREDOMINANTLY PMN     NO ORGANISMS SEEN     Performed at [facility]   Culture     Final   Value: NO GROWTH 2 DAYS     Performed at [facility]   Report Status PENDING   Incomplete  WOUND CULTURE     Status: None   Collection Time    01/28/13  4:05 PM      Result Value Range Status   Specimen Description WOUND   Final   Special Requests PATIENT ON FOLLOWING ZINACEF LEFT VENTRICULAR CLOT   Final   Gram Stain     Final   Value: MODERATE WBC PRESENT, PREDOMINANTLY PMN     NO SQUAMOUS EPITHELIAL CELLS SEEN     NO ORGANISMS SEEN     Performed at [facility]   Culture     Final   Value: NO GROWTH 2 DAYS     Performed at [facility]   Report Status 01/30/2013 FINAL   Final  AFB CULTURE WITH SMEAR     Status: None   Collection Time    01/28/13  4:05 PM      Result Value Range Status   Specimen Description WOUND   Final   Special Requests PATIENT ON FOLLOWING ZINACEF LEFT VENTRICULAR CLOT   Final   ACID FAST SMEAR     Final   Value: NO ACID FAST BACILLI SEEN     Performed at [facility]   Culture     Final   Value: CULTURE WILL BE EXAMINED FOR 6 WEEKS BEFORE ISSUING A FINAL REPORT     Performed at [facility]   Report Status PENDING   Incomplete  FUNGUS CULTURE W SMEAR     Status: None   Collection Time    01/28/13  4:05 PM      Result Value Range Status   Specimen Description WOUND   Final   Special Requests     Final   Value: PATIENT ON FOLLOWING ZINACEF LEFRT VENTRICULAR CLOT  Fungal Smear     Final   Value: NO YEAST OR FUNGAL ELEMENTS SEEN     Performed at [facility]   Culture     Final   Value: CULTURE  IN PROGRESS FOR FOUR WEEKS     Performed at [facility]   Report Status PENDING   Incomplete    Studies/Results: Dg Chest Port 1 View  01/30/2013   CLINICAL DATA:  Postop cardiac surgery  EXAM: PORTABLE CHEST - 1 VIEW  COMPARISON:  Prior chest x-ray 01/29/2013  FINDINGS: Stable position of tracheostomy tube 4.3 cm above the carinal. Right IJ approach central venous catheter. The catheter tip projects over the distal SVC. The tip of the nasogastric tube is not visualized but lies below the diaphragm, presumably within the stomach. Stable position of mediastinal drain. Stable cardiomegaly and mild mediastinal widening. The Swan-Ganz catheter has been removed. The right subclavian vascular sheath remains in unchanged position. Stable trace left apical pneumothorax versus apical pleural thickening. Improving pulmonary vascular congestion. Persistent left greater than right basilar atelectasis.  IMPRESSION: Slightly improved pulmonary vascular congestion. Persistent left greater than right basilar atelectasis.  Stable tiny left apical pneumothorax versus apical pleural fluid/ thickening.  Interval removal of Swan-Ganz catheter. Other support apparatus remain in unchanged position.   Electronically Signed   By: Malachy Moan M.D.   On: 01/30/2013 07:50   Dg Chest Portable 1 View In Am  01/29/2013   CLINICAL DATA:  Post CABG, followup  EXAM: PORTABLE CHEST - 1 VIEW  COMPARISON:  Portable chest x-ray of 01/28/2013  FINDINGS: The lungs are slightly better aerated. There is still cardiomegaly present with basilar atelectasis and probable mild pulmonary vascular congestion. The endotracheal tube is unchanged in position with the tip approximately 3.6 cm above the carinal.  IMPRESSION: Slightly better aeration. Cardiomegaly with probable mild pulmonary vascular congestion and basilar atelectasis.   Electronically Signed   By: Dwyane Dee M.D.   On: 01/29/2013 08:13   Dg Chest Portable 1  View  01/28/2013   CLINICAL DATA:  Postoperative evaluation; cardiomyopathy  EXAM: PORTABLE CHEST - 1 VIEW  COMPARISON:  Study obtained earlier in the day  FINDINGS: Endotracheal tube tip is 3.1 cm above the carinal. There is a mediastinal drain. Swan-Ganz catheter tip is in the main pulmonary outflow tract. There is a catheter arising from below the diaphragm with tip in the region of the right ventricle. Temporary pacemaker wires are attached to the right heart. Nasogastric tube tip and side port are in the stomach. No pneumothorax.  There is cardiomegaly with pulmonary venous hypertension. There is trace edema. No consolidation.  IMPRESSION: Tube and catheter positions as described without pneumothorax. There is a catheter extending from below the diaphragm with the tip overlying the heart, likely in the right ventricle. The exact location of this catheter tip is uncertain on this single view.  There is cardiomegaly with trace edema and pulmonary venous hypertension. No airspace consolidation.   Electronically Signed   By: Bretta Bang M.D.   On: 01/28/2013 21:30    Assessment/Plan: 1) pericarditis - negative cultures to date, continue with broad spectrum antibiotics.    Staci Righter, MD [facility] [email or web address] [phone] pager   [phone] cell 01/30/2013, 1:43 PM

## 2013-01-30 NOTE — Progress Notes (Signed)
Stroke Team Progress Note  HISTORY [name] is an 52 y.o. male with a history of CAD s/p NSTEMI on 9/23 and admission 10/8 with recurrent chest pain secondary to acute pericarditis and cardiac tamponade (Dressler syndrome). Patient had a drain placed that was removed on the 14th due to decreased drainage. Patient has been doing well other than periods of hypotension until this morning after using the bathroom. At that time complained of being unable to use his left hand. Patient went to head CT and on return complained of difficulty seeing to the left as well.   Date last known well: Date: 01/28/2013  Time last known well: Time: 05:45  tPA Given: No: Patient with recent pericarditis and drainage   01/28/2013 Nursing reported respiratory distress, rigors, patient complained of not being able to move left hand with numbness to mid forearm, ataxia of left arm. CT ordered which showed the old left cerebellar infarct.  01/29/2013 Pericardial tamponade with clotted blood  Contained ruptured inferoposterior pseudoaneurysm  Extensive necrotic myocardium  3 x 5 cm defect, repaired    SUBJECTIVE Patient awake. Follows commands appropriately. Still flaccid left arm.     OBJECTIVE Most recent Vital Signs: Filed Vitals:   01/30/13 0500 01/30/13 0600 01/30/13 0700 01/30/13 0739  BP:      Pulse: 104 102 94   Temp:    99.8 F (37.7 C)  TempSrc:    Oral  Resp: 30 22 22    Height:      Weight: 113.218 kg (249 lb 9.6 oz)     SpO2: 98% 96% 100%    CBG (last 3)   Recent Labs  01/29/13 1312 01/29/13 1637 01/30/13 0741  GLUCAP 131* 107* 149*    IV Fluid Intake:   . sodium chloride 20 mL/hr at 01/28/13 2100  . sodium chloride 20 mL/hr at 01/28/13 2100  . sodium chloride    . dexmedetomidine 0.1 mcg/kg/hr (01/30/13 0700)  . DOPamine 3 mcg/kg/min (01/30/13 0700)  . furosemide (LASIX) infusion 10 mg/hr (01/30/13 0700)  . insulin (NOVOLIN-R) infusion Stopped (01/29/13 2200)  .  lactated ringers 20 mL/hr at 01/28/13 2100  . nitroGLYCERIN Stopped (01/28/13 2100)  . norepinephrine (LEVOPHED) Adult infusion Stopped (01/29/13 1800)    MEDICATIONS  . acetaminophen  1,000 mg Oral Q6H   Or  . acetaminophen (TYLENOL) oral liquid 160 mg/5 mL  1,000 mg Per Tube Q6H  . artificial tears  1 application Both Eyes Q8H  . aspirin EC  325 mg Oral Daily   Or  . aspirin  324 mg Per Tube Daily  . bisacodyl  10 mg Oral Daily   Or  . bisacodyl  10 mg Rectal Daily  . docusate sodium  200 mg Oral Daily  . enoxaparin (LOVENOX) injection  40 mg Subcutaneous Q24H  . fluconazole (DIFLUCAN) IV  400 mg Intravenous Q24H  . imipenem-cilastatin  500 mg Intravenous Q8H  . insulin aspart  0-24 Units Subcutaneous Q4H  . insulin detemir  30 Units Subcutaneous Daily  . insulin regular  0-10 Units Intravenous TID WC  . magnesium sulfate  4 g Intravenous Once  . metoprolol tartrate  12.5 mg Oral BID   Or  . metoprolol tartrate  12.5 mg Per Tube BID  . pantoprazole  40 mg Oral Daily  . potassium chloride  10 mEq Intravenous Q1 Hr x 3  . sodium chloride  3 mL Intravenous Q12H   PRN:  metoprolol, midazolam, morphine injection, ondansetron (ZOFRAN) IV, oxyCODONE, sodium  chloride  Diet:     NPO Activity:  Bedrest DVT Prophylaxis:  SCD  CLINICALLY SIGNIFICANT STUDIES Basic Metabolic Panel:   Recent Labs Lab 01/29/13 0201 01/29/13 1700 01/29/13 1712 01/30/13 0400  NA 142  --  144 145  K 4.8  --  4.0 3.9  CL 105  --  103 107  CO2 26  --   --  27  GLUCOSE 145*  --  111* 143*  BUN 28*  --  31* 36*  CREATININE 1.83* 1.87* 2.10* 1.78*  CALCIUM 7.0*  --   --  7.4*  MG 1.9 2.0  --   --    Liver Function Tests:  No results found for this basename: AST, ALT, ALKPHOS, BILITOT, PROT, ALBUMIN,  in the last 168 hours CBC:   Recent Labs Lab 01/29/13 1700 01/29/13 1712 01/30/13 0400  WBC 14.4*  --  17.6*  HGB 8.6* 9.2* 8.1*  HCT 23.8* 27.0* 22.9*  MCV 82.6  --  85.1  PLT 136*  --  164    Coagulation:   Recent Labs Lab 01/28/13 1059 01/28/13 1858 01/28/13 2011 01/28/13 2050  LABPROT 16.4* 26.1* 21.4* 20.3*  INR 1.36 2.49* 1.92* 1.79*   Cardiac Enzymes:   Recent Labs Lab 01/29/13 0715  CKTOTAL 436*  CKMB 28.0*   Urinalysis:   Recent Labs Lab 01/28/13 0847  COLORURINE ORANGE*  LABSPEC 1.030  PHURINE 5.0  GLUCOSEU NEGATIVE  HGBUR NEGATIVE  BILIRUBINUR MODERATE*  KETONESUR 15*  PROTEINUR 30*  UROBILINOGEN 4.0*  NITRITE POSITIVE*  LEUKOCYTESUR TRACE*   Lipid Panel    Component Value Date/Time   CHOL 164 01/06/2013 0511   TRIG 109 01/06/2013 0511   HDL 44 01/06/2013 0511   CHOLHDL 3.7 01/06/2013 0511   VLDL 22 01/06/2013 0511   LDLCALC 98 01/06/2013 0511   HgbA1C  Lab Results  Component Value Date   HGBA1C 5.7* 01/05/2013    Urine Drug Screen:     Component Value Date/Time   LABOPIA NONE DETECTED 01/05/2013 2053   COCAINSCRNUR NONE DETECTED 01/05/2013 2053   LABBENZ NONE DETECTED 01/05/2013 2053   AMPHETMU NONE DETECTED 01/05/2013 2053   THCU NONE DETECTED 01/05/2013 2053   LABBARB NONE DETECTED 01/05/2013 2053    Alcohol Level: No results found for this basename: ETH,  in the last 168 hours  Ct Head Wo Contrast 01/28/2013   No acute abnormality. Old left cerebellar infarct. Chronic small vessel ischemic changes.    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 The effusion is located anteriorly around the RV free wall. Maximum diameter in diastole is 10 mm. There is no collapse of the right ventricle in diastole. IVC is dilated with less than 50% respiratory variations. There are no signs of tamponade. There is akinesis of the basal and mid inferior and inferoseptal  walls and apical inferior wall. Overall left ventricular ejection fraction, 40-45 % 01/24/2013 A moderate, free-flowing pericardial effusionwas identified circumferential to the heart, predominantly anterior.The fluid contained focal strands. Doppler: The possibility of hemodynamic compromise cannot be excluded on the basis of available images. Significant mitral inflow variation is noted. The IVC does not collapse and the patient is tachycardic. 10/10/2014Left ventricle: Akinesis inferolateral wall. Dyskinesis  base inferior wall. The cavity size was normal. Wall thickness was increased in a pattern of mild LVH. The estimated ejection fraction was 35%. - Right ventricle: The cavity size was moderately dilated. Systolic function was moderately reduced. - Right atrium: The atrium was mildly dilated. - Pericardium, extracardiac: Pericardial effusion is only moderate, but it is larger than yesterday. There is some RV collapse. I am concerned about possible tamponade heodynamics.   Carotid Doppler    CXR    Therapy Recommendations   Filed Vitals:   01/30/13 0739  BP:   Pulse:   Temp: 99.8 F (37.7 C)  Resp:    Physical Exam   Patient intubated. Awake follows commands. Shakes head yes, no appropriately. Unable to move right arm/hand. RUE/LLE/RLE movements intact.   ASSESSMENT [name] is a 52 y.o. male presenting with left arm weakness, numbness and left visual field deficits. CT shows no acute stroke. Symptoms secondary to unknown source but sepsis suspected.  On aspirin 81 mg orally every day and clopidogrel 75 mg orally every day prior to admission. Currently off antithrombotics due to bleeding risk. Work up underway.   Respiratory distress, intubated  Recurrent tamponade with inferiorposterior pseudoaneurysm, necrosis, s/p OR pod # 2  Sepsis, on IV abx  LDL 98  HGB A1C   5.7  Former smoker  Coronary artery disease, s/p stent  Hospital day #  9  TREATMENT/PLAN  MRI/A Brain when patient stabilizes; hopefully within next 24-48 hours.  Asprin 300mg  rectally every day started for cardiac/secondary stroke prevention  Plans are to wean off vent today.  Risk factor modification  Gwendolyn Lima. Manson Passey, [facility], MBA, MHA [facility] Pager: [phone] 01/30/2013 8:25 AM  I have personally obtained a history, examined the patient, evaluated imaging results, and formulated the assessment and plan of care. I agree with the above. Delia Heady, MD

## 2013-01-30 NOTE — Progress Notes (Signed)
ANTIBIOTIC CONSULT NOTE - FOLLOW UP  Pharmacy Consult for Vancomycin, Primaxin, Fluconazole Indication: Empiric pericarditis, r/o sepsis  No Known Allergies  Patient Measurements: Height: 6\' 1"  (185.4 cm) Weight: 249 lb 9.6 oz (113.218 kg) IBW/kg (Calculated) : 79.9  Vital Signs: Temp: 99.1 F (37.3 C) (10/17 1128) Temp src: Oral (10/17 1128) BP: 125/64 mmHg (10/17 1010) Pulse Rate: 103 (10/17 1200) Intake/Output from previous day: 10/16 0701 - 10/17 0700 In: 2717.5 [I.V.:1725; Blood:352.5; NG/GT:90; IV Piggyback:550] Out: 1610 [Urine:6375; Emesis/NG output:200; Chest Tube:130] Intake/Output from this shift: Total I/O In: 518.8 [I.V.:318.8; IV Piggyback:200] Out: 1305 [Urine:1275; Chest Tube:30]  Labs:  Recent Labs  01/29/13 0201 01/29/13 1700 01/29/13 1712 01/30/13 0400  WBC 14.9* 14.4*  --  17.6*  HGB 7.2* 8.6* 9.2* 8.1*  PLT 109* 136*  --  164  CREATININE 1.83* 1.87* 2.10* 1.78*   Estimated Creatinine Clearance: 64.7 ml/min (by C-G formula based on Cr of 1.78).  Recent Labs  01/29/13 0415 01/30/13 1140  VANCORANDOM 16.3 13.8     Microbiology: Recent Results (from the past 720 hour(s))  MRSA PCR SCREENING     Status: None   Collection Time    01/05/13  9:58 PM      Result Value Range Status   MRSA by PCR NEGATIVE  NEGATIVE Final   Comment:            The GeneXpert MRSA Assay (FDA     approved for NASAL specimens     only), is one component of a     comprehensive MRSA colonization     surveillance program. It is not     intended to diagnose MRSA     infection nor to guide or     monitor treatment for     MRSA infections.  MRSA PCR SCREENING     Status: None   Collection Time    01/21/13  4:57 PM      Result Value Range Status   MRSA by PCR NEGATIVE  NEGATIVE Final   Comment:            The GeneXpert MRSA Assay (FDA     approved for NASAL specimens     only), is one component of a     comprehensive MRSA colonization     surveillance program.  It is not     intended to diagnose MRSA     infection nor to guide or     monitor treatment for     MRSA infections.  BODY FLUID CULTURE     Status: None   Collection Time    01/24/13 10:20 AM      Result Value Range Status   Specimen Description PERICARDIAL FLUID   Final   Special Requests NONE   Final   Gram Stain     Final   Value: ABUNDANT WBC PRESENT, PREDOMINANTLY PMN     NO ORGANISMS SEEN     Performed at [facility]   Culture     Final   Value: NO GROWTH 3 DAYS     Performed at [facility]   Report Status 01/28/2013 FINAL   Final  URINE CULTURE     Status: None   Collection Time    01/28/13  8:48 AM      Result Value Range Status   Specimen Description URINE, CATHETERIZED   Final   Special Requests none Normal   Final   Culture  Setup Time  Final   Value: 01/28/2013 16:01     Performed at [facility] Count     Final   Value: NO GROWTH     Performed at [facility]   Culture     Final   Value: NO GROWTH     Performed at [facility]   Report Status 01/29/2013 FINAL   Final  CULTURE, RESPIRATORY (NON-EXPECTORATED)     Status: None   Collection Time    01/28/13 10:24 AM      Result Value Range Status   Specimen Description TRACHEAL ASPIRATE   Final   Special Requests NONE   Final   Gram Stain     Final   Value: FEW WBC PRESENT,BOTH PMN AND MONONUCLEAR     NO SQUAMOUS EPITHELIAL CELLS SEEN     RARE GRAM POSITIVE COCCI     IN PAIRS     Performed at [facility]   Culture     Final   Value: Non-Pathogenic Oropharyngeal-type Flora Isolated.     Performed at [facility]   Report Status 01/30/2013 FINAL   Final  CULTURE, BLOOD (ROUTINE X 2)     Status: None   Collection Time    01/28/13 11:30 AM      Result Value Range Status   Specimen Description BLOOD LEFT ARM   Final   Special Requests BOTTLES DRAWN AEROBIC AND ANAEROBIC 10CC   Final   Culture  Setup Time     Final   Value: 01/28/2013  14:06     Performed at [facility]   Culture     Final   Value:        BLOOD CULTURE RECEIVED NO GROWTH TO DATE CULTURE WILL BE HELD FOR 5 DAYS BEFORE ISSUING A FINAL NEGATIVE REPORT     Performed at [facility]   Report Status PENDING   Incomplete  CULTURE, BLOOD (ROUTINE X 2)     Status: None   Collection Time    01/28/13 11:30 AM      Result Value Range Status   Specimen Description BLOOD RIGHT FOREARM   Final   Special Requests BOTTLES DRAWN AEROBIC AND ANAEROBIC 10CC   Final   Culture  Setup Time     Final   Value: 01/28/2013 14:06     Performed at [facility]   Culture     Final   Value:        BLOOD CULTURE RECEIVED NO GROWTH TO DATE CULTURE WILL BE HELD FOR 5 DAYS BEFORE ISSUING A FINAL NEGATIVE REPORT     Performed at [facility]   Report Status PENDING   Incomplete  SURGICAL PCR SCREEN     Status: Abnormal   Collection Time    01/28/13  1:19 PM      Result Value Range Status   MRSA, PCR NEGATIVE  NEGATIVE Final   Staphylococcus aureus POSITIVE (*) NEGATIVE Final   Comment:            The Xpert SA Assay (FDA     approved for NASAL specimens     in patients over 45 years of age),     is one component of     a comprehensive surveillance     program.  Test performance has     been validated by [employer] for patients greater     than or equal to 1  year old.     It is not intended     to diagnose infection nor to     guide or monitor treatment.  AFB CULTURE WITH SMEAR     Status: None   Collection Time    01/28/13  3:36 PM      Result Value Range Status   Specimen Description WOUND PERICARDIAL   Final   Special Requests PATIENT ON FOLLOWING ZINACEF PERICARDIAL CLOT   Final   ACID FAST SMEAR     Final   Value: NO ACID FAST BACILLI SEEN     Performed at [facility]   Culture     Final   Value: CULTURE WILL BE EXAMINED FOR 6 WEEKS BEFORE ISSUING A FINAL REPORT     Performed at [facility]   Report Status  PENDING   Incomplete  WOUND CULTURE     Status: None   Collection Time    01/28/13  3:36 PM      Result Value Range Status   Specimen Description WOUND PERICARDIAL   Final   Special Requests PATIENT ON FOLLOWING ZINACEF PERICARDIAL CLOT   Final   Gram Stain     Final   Value: RARE WBC PRESENT,BOTH PMN AND MONONUCLEAR     NO SQUAMOUS EPITHELIAL CELLS SEEN     NO ORGANISMS SEEN     Performed at [facility]   Culture     Final   Value: NO GROWTH 2 DAYS     Performed at [facility]   Report Status 01/30/2013 FINAL   Final  FUNGUS CULTURE W SMEAR     Status: None   Collection Time    01/28/13  3:36 PM      Result Value Range Status   Specimen Description WOUND PERICARDIAL   Final   Special Requests PATIENT ON FOLLOWING ZINACEF PERICARDIAL CLOT   Final   Fungal Smear     Final   Value: NO YEAST OR FUNGAL ELEMENTS SEEN     Performed at [facility]   Culture     Final   Value: CULTURE IN PROGRESS FOR FOUR WEEKS     Performed at [facility]   Report Status PENDING   Incomplete  AFB CULTURE WITH SMEAR     Status: None   Collection Time    01/28/13  3:36 PM      Result Value Range Status   Specimen Description WOUND PERICARDIAL   Final   Special Requests PATIENT ON FOLLOWING ZINACEF PERICARDIAL CLOT   Final   ACID FAST SMEAR     Final   Value: NO ACID FAST BACILLI SEEN     Performed at [facility]   Culture     Final   Value: CULTURE WILL BE EXAMINED FOR 6 WEEKS BEFORE ISSUING A FINAL REPORT     Performed at [facility]   Report Status PENDING   Incomplete  WOUND CULTURE     Status: None   Collection Time    01/28/13  3:36 PM      Result Value Range Status   Specimen Description WOUND PERICARDIAL   Final   Special Requests PATIENT ON FOLLOWING ZINACEF PERICARDIAL CLOT   Final   Gram Stain     Final   Value: MODERATE WBC PRESENT,BOTH PMN AND MONONUCLEAR     NO SQUAMOUS EPITHELIAL CELLS SEEN     NO ORGANISMS SEEN      Performed  at [facility]   Culture     Final   Value: NO GROWTH 2 DAYS     Performed at [facility]   Report Status 01/30/2013 FINAL   Final  FUNGUS CULTURE W SMEAR     Status: None   Collection Time    01/28/13  3:36 PM      Result Value Range Status   Specimen Description WOUND PERICARDIAL   Final   Special Requests PATIENT ON FOLLOWING ZINACEF PERICARDIAL CLOT   Final   Fungal Smear     Final   Value: NO YEAST OR FUNGAL ELEMENTS SEEN     Performed at [facility]   Culture     Final   Value: CULTURE IN PROGRESS FOR FOUR WEEKS     Performed at [facility]   Report Status PENDING   Incomplete  ANAEROBIC CULTURE     Status: None   Collection Time    01/28/13  3:36 PM      Result Value Range Status   Specimen Description WOUND PERICARDIAL   Final   Special Requests PATIENT ON FOLLOWING ZINACEF PERICARDIAL CLOT   Final   Gram Stain     Final   Value: MODERATE WBC PRESENT,BOTH PMN AND MONONUCLEAR     NO SQUAMOUS EPITHELIAL CELLS SEEN     NO ORGANISMS SEEN     Performed at [facility]   Culture     Final   Value: NO ANAEROBES ISOLATED; CULTURE IN PROGRESS FOR 5 DAYS     Performed at [facility]   Report Status PENDING   Incomplete  ANAEROBIC CULTURE     Status: None   Collection Time    01/28/13  3:36 PM      Result Value Range Status   Specimen Description WOUND PERICARDIAL   Final   Special Requests PATIENT ON FOLLOWING ZINACEF PERICARDIAL CLOT   Final   Gram Stain     Final   Value: RARE WBC PRESENT,BOTH PMN AND MONONUCLEAR     NO SQUAMOUS EPITHELIAL CELLS SEEN     NO ORGANISMS SEEN     Performed at [facility]   Culture     Final   Value: NO ANAEROBES ISOLATED; CULTURE IN PROGRESS FOR 5 DAYS     Performed at [facility]   Report Status PENDING   Incomplete  AFB CULTURE WITH SMEAR     Status: None   Collection Time    01/28/13  3:42 PM      Result Value Range Status   Specimen Description TISSUE  PERICARDIAL   Final   Special Requests NO 2 PT ON ZINACEF   Final   ACID FAST SMEAR     Final   Value: NO ACID FAST BACILLI SEEN     Performed at [facility]   Culture     Final   Value: CULTURE WILL BE EXAMINED FOR 6 WEEKS BEFORE ISSUING A FINAL REPORT     Performed at [facility]   Report Status PENDING   Incomplete  ANAEROBIC CULTURE     Status: None   Collection Time    01/28/13  3:42 PM      Result Value Range Status   Specimen Description TISSUE PERICARDIAL   Final   Special Requests NO 2 PT ON ZINACEF   Final   Gram Stain     Final   Value: FEW WBC PRESENT,BOTH PMN AND  MONONUCLEAR     NO ORGANISMS SEEN     Performed at [facility]     Final   Value: NO ANAEROBES ISOLATED; CULTURE IN PROGRESS FOR 5 DAYS     Performed at [facility]   Report Status PENDING   Incomplete  FUNGUS CULTURE W SMEAR     Status: None   Collection Time    01/28/13  3:42 PM      Result Value Range Status   Specimen Description TISSUE PERICARDIAL   Final   Special Requests NO 2 PT ON ZINACEF   Final   Fungal Smear     Final   Value: NO YEAST OR FUNGAL ELEMENTS SEEN     Performed at [facility]   Culture     Final   Value: CULTURE IN PROGRESS FOR FOUR WEEKS     Performed at [facility]   Report Status PENDING   Incomplete  TISSUE CULTURE     Status: None   Collection Time    01/28/13  3:42 PM      Result Value Range Status   Specimen Description TISSUE PERICARDIAL   Final   Special Requests NO 2 PT ON ZINACEF   Final   Gram Stain     Final   Value: FEW WBC PRESENT,BOTH PMN AND MONONUCLEAR     NO ORGANISMS SEEN     Performed at [facility]   Culture     Final   Value: NO GROWTH 2 DAYS     Performed at [facility]   Report Status PENDING   Incomplete  AFB CULTURE WITH SMEAR     Status: None   Collection Time    01/28/13  3:48 PM      Result Value Range Status   Specimen Description TISSUE   Final   Special  Requests NO 3 EPICARDIUM PT ON ZINACEF   Final   ACID FAST SMEAR     Final   Value: NO ACID FAST BACILLI SEEN     Performed at [facility]   Culture     Final   Value: CULTURE WILL BE EXAMINED FOR 6 WEEKS BEFORE ISSUING A FINAL REPORT     Performed at [facility]   Report Status PENDING   Incomplete  ANAEROBIC CULTURE     Status: None   Collection Time    01/28/13  3:48 PM      Result Value Range Status   Specimen Description TISSUE   Final   Special Requests NO 3 EPICARDIUM PT ON ZINACEF   Final   Gram Stain     Final   Value: FEW WBC PRESENT, PREDOMINANTLY PMN     NO ORGANISMS SEEN     Performed at [facility]   Culture     Final   Value: NO ANAEROBES ISOLATED; CULTURE IN PROGRESS FOR 5 DAYS     Performed at [facility]   Report Status PENDING   Incomplete  FUNGUS CULTURE W SMEAR     Status: None   Collection Time    01/28/13  3:48 PM      Result Value Range Status   Specimen Description TISSUE   Final   Special Requests NO 3 EPICARDIUM PT ON ZINACEF   Final   Fungal Smear     Final   Value: NO YEAST OR FUNGAL ELEMENTS SEEN     Performed at [facility]   Culture     Final   Value: CULTURE IN PROGRESS FOR FOUR WEEKS     Performed at [facility]   Report Status PENDING   Incomplete  TISSUE CULTURE     Status: None   Collection Time    01/28/13  3:48 PM      Result Value Range Status   Specimen Description TISSUE   Final   Special Requests NO 3 EPICARDIUM PT ON ZINACEF   Final   Gram Stain     Final   Value: FEW WBC PRESENT, PREDOMINANTLY PMN     NO ORGANISMS SEEN     Performed at [facility]   Culture     Final   Value: NO GROWTH 2 DAYS     Performed at [facility]   Report Status PENDING   Incomplete  WOUND CULTURE     Status: None   Collection Time    01/28/13  4:05 PM      Result Value Range Status   Specimen Description WOUND   Final   Special Requests PATIENT ON FOLLOWING ZINACEF LEFT  VENTRICULAR CLOT   Final   Gram Stain     Final   Value: MODERATE WBC PRESENT, PREDOMINANTLY PMN     NO SQUAMOUS EPITHELIAL CELLS SEEN     NO ORGANISMS SEEN     Performed at [facility]   Culture     Final   Value: NO GROWTH 2 DAYS     Performed at [facility]   Report Status 01/30/2013 FINAL   Final  AFB CULTURE WITH SMEAR     Status: None   Collection Time    01/28/13  4:05 PM      Result Value Range Status   Specimen Description WOUND   Final   Special Requests PATIENT ON FOLLOWING ZINACEF LEFT VENTRICULAR CLOT   Final   ACID FAST SMEAR     Final   Value: NO ACID FAST BACILLI SEEN     Performed at [facility]   Culture     Final   Value: CULTURE WILL BE EXAMINED FOR 6 WEEKS BEFORE ISSUING A FINAL REPORT     Performed at [facility]   Report Status PENDING   Incomplete  FUNGUS CULTURE W SMEAR     Status: None   Collection Time    01/28/13  4:05 PM      Result Value Range Status   Specimen Description WOUND   Final   Special Requests     Final   Value: PATIENT ON FOLLOWING ZINACEF LEFRT VENTRICULAR CLOT   Fungal Smear     Final   Value: NO YEAST OR FUNGAL ELEMENTS SEEN     Performed at [facility]   Culture     Final   Value: CULTURE IN PROGRESS FOR FOUR WEEKS     Performed at [facility]   Report Status PENDING   Incomplete    Anti-infectives   Start     Dose/Rate Route Frequency Ordered Stop   01/30/13 1330  vancomycin (VANCOCIN) 1,750 mg in sodium chloride 0.9 % 500 mL IVPB     1,750 mg 250 mL/hr over 120 Minutes Intravenous Every 24 hours 01/30/13 1316     01/29/13 1200  fluconazole (DIFLUCAN) IVPB 400 mg     400 mg 100 mL/hr over 120 Minutes Intravenous Every 24 hours 01/28/13 2141  01/29/13 1100  fluconazole (DIFLUCAN) IVPB 100 mg  Status:  Discontinued     100 mg 50 mL/hr over 60 Minutes Intravenous Every 24 hours 01/28/13 0842 01/28/13 2141   01/29/13 0600  imipenem-cilastatin (PRIMAXIN) 500 mg in  sodium chloride 0.9 % 100 mL IVPB     500 mg 200 mL/hr over 30 Minutes Intravenous 3 times per day 01/29/13 0549     01/29/13 0545  vancomycin (VANCOCIN) 2,000 mg in sodium chloride 0.9 % 500 mL IVPB     2,000 mg 250 mL/hr over 120 Minutes Intravenous  Once 01/29/13 0539 01/29/13 0802   01/29/13 0315  vancomycin (VANCOCIN) IVPB 1000 mg/200 mL premix  Status:  Discontinued     1,000 mg 200 mL/hr over 60 Minutes Intravenous  Once 01/28/13 2107 01/28/13 2130   01/28/13 2315  cefUROXime (ZINACEF) 1.5 g in dextrose 5 % 50 mL IVPB  Status:  Discontinued     1.5 g 100 mL/hr over 30 Minutes Intravenous Every 12 hours 01/28/13 2107 01/28/13 2130   01/28/13 2200  vancomycin (VANCOCIN) IVPB 1000 mg/200 mL premix  Status:  Discontinued     1,000 mg 200 mL/hr over 60 Minutes Intravenous Every 12 hours 01/28/13 0842 01/28/13 1319   01/28/13 2200  imipenem-cilastatin (PRIMAXIN) 500 mg in sodium chloride 0.9 % 100 mL IVPB     500 mg 200 mL/hr over 30 Minutes Intravenous  Once 01/28/13 2140 01/28/13 2351   01/28/13 2115  vancomycin (VANCOCIN) 1,250 mg in sodium chloride 0.9 % 250 mL IVPB  Status:  Discontinued     1,250 mg 166.7 mL/hr over 90 Minutes Intravenous To Surgery 01/28/13 2107 01/28/13 2126   01/28/13 2115  cefUROXime (ZINACEF) 1.5 g in dextrose 5 % 50 mL IVPB  Status:  Discontinued     1.5 g 100 mL/hr over 30 Minutes Intravenous To Surgery 01/28/13 2107 01/28/13 2128   01/28/13 1400  piperacillin-tazobactam (ZOSYN) IVPB 3.375 g  Status:  Discontinued     3.375 g 12.5 mL/hr over 240 Minutes Intravenous 3 times per day 01/28/13 0826 01/28/13 0930   01/28/13 1400  cefTAZidime (FORTAZ) 2 g in dextrose 5 % 50 mL IVPB  Status:  Discontinued     2 g 100 mL/hr over 30 Minutes Intravenous 3 times per day 01/28/13 0930 01/28/13 1314   01/28/13 1300  vancomycin (VANCOCIN) 1,500 mg in sodium chloride 0.9 % 250 mL IVPB     1,500 mg 125 mL/hr over 120 Minutes Intravenous To Surgery 01/28/13 1255 01/28/13  1345   01/28/13 1300  cefUROXime (ZINACEF) 1.5 g in dextrose 5 % 50 mL IVPB  Status:  Discontinued     1.5 g 100 mL/hr over 30 Minutes Intravenous To Surgery 01/28/13 1255 01/28/13 1843   01/28/13 1300  cefUROXime (ZINACEF) 750 mg in dextrose 5 % 50 mL IVPB  Status:  Discontinued     750 mg 100 mL/hr over 30 Minutes Intravenous To Surgery 01/28/13 1255 01/28/13 1843   01/28/13 1300  cefUROXime (ZINACEF) 750 mg in dextrose 5 % 50 mL IVPB  Status:  Discontinued     750 mg 100 mL/hr over 30 Minutes Intravenous To Surgery 01/28/13 1258 01/28/13 1320   01/28/13 1200  metroNIDAZOLE (FLAGYL) IVPB 500 mg  Status:  Discontinued     500 mg 100 mL/hr over 60 Minutes Intravenous 4 times per day 01/28/13 1047 01/28/13 1314   01/28/13 0930  [MAR Hold]  fluconazole (DIFLUCAN) IVPB 200 mg     (On  MAR Hold since 01/28/13 1330)   200 mg 100 mL/hr over 60 Minutes Intravenous  Once 01/28/13 0826 01/28/13 1337   01/28/13 0830  piperacillin-tazobactam (ZOSYN) IVPB 3.375 g  Status:  Discontinued     3.375 g 12.5 mL/hr over 240 Minutes Intravenous STAT 01/28/13 0826 01/28/13 1314   01/28/13 0830  vancomycin (VANCOCIN) 2,000 mg in sodium chloride 0.9 % 500 mL IVPB     2,000 mg 250 mL/hr over 120 Minutes Intravenous STAT 01/28/13 0826 01/28/13 1207      Assessment: 52 y/o M continues on empiric antibiotics for empiric pericarditis, possible PNA, r/o sepsis. Vancomycin random levels have been 16.3 and 13.8, will go ahead and schedule doses. WBC 17.6<14.4, Scr 1.78<2.10, Tmax 100.2.   Goal of Therapy:  Vancomycin trough level 15-20 mcg/ml  Plan:  -Vancomycin 1750 mg IV q24h -Primaxin 500 mg IV q8h -Fluconazole 400 mg IV q24h to target pericarditis -Trend WBC, temp, renal function  -Vanco trough ASAP or random with change in renal function -ID following  Thank you for allowing me to take part in this patient's care,  Abran Duke, PharmD Clinical Pharmacist Phone: [phone] Pager:  [phone] 01/30/2013 1:25 PM

## 2013-01-31 ENCOUNTER — Inpatient Hospital Stay (HOSPITAL_COMMUNITY): Payer: Medicaid Other

## 2013-01-31 LAB — CBC
HCT: 26.7 % — ABNORMAL LOW (ref 39.0–52.0)
Hemoglobin: 9 g/dL — ABNORMAL LOW (ref 13.0–17.0)
MCH: 29.8 pg (ref 26.0–34.0)
MCHC: 33.7 g/dL (ref 30.0–36.0)
RBC: 3.02 MIL/uL — ABNORMAL LOW (ref 4.22–5.81)
RDW: 16 % — ABNORMAL HIGH (ref 11.5–15.5)
WBC: 20.2 10*3/uL — ABNORMAL HIGH (ref 4.0–10.5)

## 2013-01-31 LAB — BASIC METABOLIC PANEL
BUN: 29 mg/dL — ABNORMAL HIGH (ref 6–23)
Calcium: 7.9 mg/dL — ABNORMAL LOW (ref 8.4–10.5)
GFR calc Af Amer: 83 mL/min — ABNORMAL LOW (ref >90)
GFR calc non Af Amer: 72 mL/min — ABNORMAL LOW (ref >90)
Potassium: 3.5 mEq/L (ref 3.5–5.1)

## 2013-01-31 LAB — TISSUE CULTURE
Culture: NO GROWTH
Culture: NO GROWTH

## 2013-01-31 LAB — GLUCOSE, CAPILLARY
Glucose-Capillary: 70 mg/dL (ref 70–99)
Glucose-Capillary: 70 mg/dL (ref 70–99)
Glucose-Capillary: 74 mg/dL (ref 70–99)
Glucose-Capillary: 92 mg/dL (ref 70–99)

## 2013-01-31 MED ORDER — POTASSIUM CHLORIDE 10 MEQ/50ML IV SOLN
10.0000 meq | INTRAVENOUS | Status: AC
  Administered 2013-01-31 (×3): 10 meq via INTRAVENOUS
  Filled 2013-01-31 (×2): qty 50

## 2013-01-31 MED ORDER — METOPROLOL TARTRATE 25 MG/10 ML ORAL SUSPENSION
12.5000 mg | Freq: Two times a day (BID) | ORAL | Status: DC
  Filled 2013-01-31 (×5): qty 5

## 2013-01-31 MED ORDER — SODIUM CHLORIDE 0.9 % IV SOLN
500.0000 mg | Freq: Four times a day (QID) | INTRAVENOUS | Status: DC
  Administered 2013-01-31 – 2013-02-04 (×17): 500 mg via INTRAVENOUS
  Filled 2013-01-31 (×21): qty 500

## 2013-01-31 MED ORDER — VANCOMYCIN HCL 10 G IV SOLR
1250.0000 mg | Freq: Two times a day (BID) | INTRAVENOUS | Status: DC
  Administered 2013-01-31 – 2013-02-02 (×4): 1250 mg via INTRAVENOUS
  Filled 2013-01-31 (×5): qty 1250

## 2013-01-31 MED ORDER — METOPROLOL TARTRATE 25 MG PO TABS
25.0000 mg | ORAL_TABLET | Freq: Two times a day (BID) | ORAL | Status: DC
  Administered 2013-01-31 – 2013-02-01 (×3): 25 mg via ORAL
  Filled 2013-01-31 (×5): qty 1

## 2013-01-31 MED ORDER — POTASSIUM CHLORIDE 10 MEQ/50ML IV SOLN
10.0000 meq | INTRAVENOUS | Status: AC | PRN
  Administered 2013-01-31 (×3): 10 meq via INTRAVENOUS

## 2013-01-31 NOTE — Evaluation (Signed)
Physical Therapy Evaluation Patient Details Name: [name] MRN: [id number] DOB: January 27, 1961 Today's Date: 01/31/2013 Time: 8469-6295 PT Time Calculation (min): 26 min  PT Assessment / Plan / Recommendation History of Present Illness  [name] is an 52 y.o. male with a history of CAD s/p NSTEMI on 9/23 and admission 10/8 with recurrent chest pain secondary to acute pericarditis and cardiac tamponade (Dressler syndrome). Patient had a drain placed that was removed on the 14th due to decreased drainage. Patient has been doing well other than periods of hypotension until this morning after using the bathroom. At that time complained of being unable to use his left hand. Patient went to head CT and on return complained of difficulty seeing to the left as well.    Clinical Impression  Patient presents with problems listed below.  Will benefit from acute PT to maximize independence prior to discharge.  Patient was independent and active pta.  Recommend Inpatient Rehab consult to maximize functional mobility.    PT Assessment  Patient needs continued PT services    Follow Up Recommendations  CIR    Does the patient have the potential to tolerate intense rehabilitation      Barriers to Discharge        Equipment Recommendations  Wheelchair (measurements PT);Wheelchair cushion (measurements PT)    Recommendations for Other Services Rehab consult   Frequency Min 4X/week    Precautions / Restrictions Precautions Precautions: Fall;Sternal Restrictions Weight Bearing Restrictions: No   Pertinent Vitals/Pain       Mobility  Bed Mobility Bed Mobility: Not assessed (Patient in recliner as PT entered room) Transfers Transfers: Sit to Stand;Stand to Sit Sit to Stand: 1: +2 Total assist;From chair/3-in-1 Sit to Stand: Patient Percentage: 50% Stand to Sit: 1: +2 Total assist;To chair/3-in-1 Stand to Sit: Patient Percentage: 60% Details for Transfer Assistance: Verbal cues  to lean forward in chair - required mod assist.  Verbal cues to push with LE's to come to standing.  Required +2 total assist to reach standing.  Patient able to bear weight through LLE.  Stood x 4 minutes.  Patient initially leaning posteriorly.  Cues to stand upright, look up, and shift weight forward. Ambulation/Gait Ambulation/Gait Assistance: Not tested (comment) Modified Rankin (Stroke Patients Only) Pre-Morbid Rankin Score: No symptoms Modified Rankin: Severe disability    Exercises General Exercises - Lower Extremity Long Arc Quad: AROM;Both;10 reps;Seated Hip Flexion/Marching: AROM;Both;10 reps;Seated Toe Raises: AROM;Both;10 reps;Seated Heel Raises: AROM;Both;10 reps;Seated   PT Diagnosis: Difficulty walking;Generalized weakness;Hemiplegia non-dominant side  PT Problem List: Decreased strength;Decreased activity tolerance;Decreased balance;Decreased mobility;Decreased knowledge of use of DME;Cardiopulmonary status limiting activity PT Treatment Interventions: DME instruction;Gait training;Functional mobility training;Therapeutic exercise;Balance training;Neuromuscular re-education;Patient/family education     PT Goals(Current goals can be found in the care plan section) Acute Rehab PT Goals Patient Stated Goal: To walk PT Goal Formulation: With patient/family Time For Goal Achievement: 02/14/13 Potential to Achieve Goals: Good  Visit Information  Last PT Received On: 01/31/13 Assistance Needed: +2 History of Present Illness: [name] is an 52 y.o. male with a history of CAD s/p NSTEMI on 9/23 and admission 10/8 with recurrent chest pain secondary to acute pericarditis and cardiac tamponade (Dressler syndrome). Patient had a drain placed that was removed on the 14th due to decreased drainage. Patient has been doing well other than periods of hypotension until this morning after using the bathroom. At that time complained of being unable to use his left hand. Patient  went to head  CT and on return complained of difficulty seeing to the left as well.         Prior Functioning  Home Living Family/patient expects to be discharged to:: Inpatient rehab Living Arrangements: Children Available Help at Discharge: Family;Available 24 hours/day Type of Home: Apartment Home Access: Stairs to enter Entrance Stairs-Number of Steps: 3 Entrance Stairs-Rails: None Home Layout: One level Home Equipment: None Prior Function Level of Independence: Independent Communication Communication: Expressive difficulties (Soft voice)    Cognition  Cognition Arousal/Alertness: Awake/alert Behavior During Therapy: Flat affect Overall Cognitive Status: Within Functional Limits for tasks assessed    Extremity/Trunk Assessment Upper Extremity Assessment Upper Extremity Assessment: LUE deficits/detail LUE Deficits / Details: No movement notrd Lower Extremity Assessment Lower Extremity Assessment: LLE deficits/detail;Generalized weakness LLE Deficits / Details: Strength grossly 3-/5   Balance Balance Balance Assessed: Yes Static Sitting Balance Static Sitting - Balance Support: Right upper extremity supported;Feet supported Static Sitting - Level of Assistance: 4: Min assist Static Sitting - Comment/# of Minutes: Sat at edge of chair x 3 minutes.  Initially with posterior lean.  Worked on shifting weight forward over hips. Static Standing Balance Static Standing - Balance Support: Right upper extremity supported Static Standing - Level of Assistance: 1: +2 Total assist Static Standing - Comment/# of Minutes: 4 minutes initially with posterior lean.  Able to shift weight forward and maintain balance with min assist.  End of Session PT - End of Session Equipment Utilized During Treatment: Gait belt;Oxygen Activity Tolerance: Patient limited by fatigue Patient left: in chair;with call bell/phone within reach;with family/visitor present Nurse Communication: Mobility status   GP     Vena Austria 01/31/2013, 1:06 PM Durenda Hurt. Renaldo Fiddler, [facility] Acute Rehab Services Pager [phone]

## 2013-01-31 NOTE — Progress Notes (Signed)
ANTIBIOTIC CONSULT NOTE - FOLLOW UP  Pharmacy Consult for Vancomycin, Imipenem, Fluconazole Indication: rule out sepsis  No Known Allergies  Patient Measurements: Height: 6\' 1"  (185.4 cm) Weight: 246 lb 11.1 oz (111.9 kg) IBW/kg (Calculated) : 79.9  Vital Signs: Temp: 98.4 F (36.9 C) (10/18 0711) Temp src: Oral (10/18 1131) BP: 100/75 mmHg (10/18 1200) Pulse Rate: 101 (10/18 1200) Intake/Output from previous day: 10/17 0701 - 10/18 0700 In: 2842.2 [I.V.:1342.2; IV Piggyback:1500] Out: 4255 [Urine:4225; Chest Tube:30] Intake/Output from this shift: Total I/O In: 711 [P.O.:180; I.V.:281; IV Piggyback:250] Out: 775 [Urine:775]  Labs:  Recent Labs  01/29/13 1700 01/29/13 1712 01/30/13 0400 01/30/13 1645 01/31/13 0420  WBC 14.4*  --  17.6*  --  20.2*  HGB 8.6* 9.2* 8.1*  --  9.0*  PLT 136*  --  164  --  226  CREATININE 1.87* 2.10* 1.78* 1.42* 1.15   Estimated Creatinine Clearance: 99.6 ml/min (by C-G formula based on Cr of 1.15).  Recent Labs  01/29/13 0415 01/30/13 1140  VANCORANDOM 16.3 13.8     Microbiology: Recent Results (from the past 720 hour(s))  MRSA PCR SCREENING     Status: None   Collection Time    01/05/13  9:58 PM      Result Value Range Status   MRSA by PCR NEGATIVE  NEGATIVE Final   Comment:            The GeneXpert MRSA Assay (FDA     approved for NASAL specimens     only), is one component of a     comprehensive MRSA colonization     surveillance program. It is not     intended to diagnose MRSA     infection nor to guide or     monitor treatment for     MRSA infections.  MRSA PCR SCREENING     Status: None   Collection Time    01/21/13  4:57 PM      Result Value Range Status   MRSA by PCR NEGATIVE  NEGATIVE Final   Comment:            The GeneXpert MRSA Assay (FDA     approved for NASAL specimens     only), is one component of a     comprehensive MRSA colonization     surveillance program. It is not     intended to diagnose  MRSA     infection nor to guide or     monitor treatment for     MRSA infections.  BODY FLUID CULTURE     Status: None   Collection Time    01/24/13 10:20 AM      Result Value Range Status   Specimen Description PERICARDIAL FLUID   Final   Special Requests NONE   Final   Gram Stain     Final   Value: ABUNDANT WBC PRESENT, PREDOMINANTLY PMN     NO ORGANISMS SEEN     Performed at [facility]   Culture     Final   Value: NO GROWTH 3 DAYS     Performed at [facility]   Report Status 01/28/2013 FINAL   Final  URINE CULTURE     Status: None   Collection Time    01/28/13  8:48 AM      Result Value Range Status   Specimen Description URINE, CATHETERIZED   Final   Special Requests none Normal   Final   Culture  Setup Time     Final   Value: 01/28/2013 16:01     Performed at [facility] Count     Final   Value: NO GROWTH     Performed at [facility]   Culture     Final   Value: NO GROWTH     Performed at [facility]   Report Status 01/29/2013 FINAL   Final  CULTURE, RESPIRATORY (NON-EXPECTORATED)     Status: None   Collection Time    01/28/13 10:24 AM      Result Value Range Status   Specimen Description TRACHEAL ASPIRATE   Final   Special Requests NONE   Final   Gram Stain     Final   Value: FEW WBC PRESENT,BOTH PMN AND MONONUCLEAR     NO SQUAMOUS EPITHELIAL CELLS SEEN     RARE GRAM POSITIVE COCCI     IN PAIRS     Performed at [facility]   Culture     Final   Value: Non-Pathogenic Oropharyngeal-type Flora Isolated.     Performed at [facility]   Report Status 01/30/2013 FINAL   Final  CULTURE, BLOOD (ROUTINE X 2)     Status: None   Collection Time    01/28/13 11:30 AM      Result Value Range Status   Specimen Description BLOOD LEFT ARM   Final   Special Requests BOTTLES DRAWN AEROBIC AND ANAEROBIC 10CC   Final   Culture  Setup Time     Final   Value: 01/28/2013 14:06     Performed at [facility]   Culture     Final   Value:        BLOOD CULTURE RECEIVED NO GROWTH TO DATE CULTURE WILL BE HELD FOR 5 DAYS BEFORE ISSUING A FINAL NEGATIVE REPORT     Performed at [facility]   Report Status PENDING   Incomplete  CULTURE, BLOOD (ROUTINE X 2)     Status: None   Collection Time    01/28/13 11:30 AM      Result Value Range Status   Specimen Description BLOOD RIGHT FOREARM   Final   Special Requests BOTTLES DRAWN AEROBIC AND ANAEROBIC 10CC   Final   Culture  Setup Time     Final   Value: 01/28/2013 14:06     Performed at [facility]   Culture     Final   Value:        BLOOD CULTURE RECEIVED NO GROWTH TO DATE CULTURE WILL BE HELD FOR 5 DAYS BEFORE ISSUING A FINAL NEGATIVE REPORT     Performed at [facility]   Report Status PENDING   Incomplete  SURGICAL PCR SCREEN     Status: Abnormal   Collection Time    01/28/13  1:19 PM      Result Value Range Status   MRSA, PCR NEGATIVE  NEGATIVE Final   Staphylococcus aureus POSITIVE (*) NEGATIVE Final   Comment:            The Xpert SA Assay (FDA     approved for NASAL specimens     in patients over 35 years of age),     is one component of     a comprehensive surveillance     program.  Test performance has     been validated by [employer] for patients greater  than or equal to 29 year old.     It is not intended     to diagnose infection nor to     guide or monitor treatment.  AFB CULTURE WITH SMEAR     Status: None   Collection Time    01/28/13  3:36 PM      Result Value Range Status   Specimen Description WOUND PERICARDIAL   Final   Special Requests PATIENT ON FOLLOWING ZINACEF PERICARDIAL CLOT   Final   ACID FAST SMEAR     Final   Value: NO ACID FAST BACILLI SEEN     Performed at [facility]   Culture     Final   Value: CULTURE WILL BE EXAMINED FOR 6 WEEKS BEFORE ISSUING A FINAL REPORT     Performed at [facility]   Report Status PENDING   Incomplete  WOUND CULTURE      Status: None   Collection Time    01/28/13  3:36 PM      Result Value Range Status   Specimen Description WOUND PERICARDIAL   Final   Special Requests PATIENT ON FOLLOWING ZINACEF PERICARDIAL CLOT   Final   Gram Stain     Final   Value: RARE WBC PRESENT,BOTH PMN AND MONONUCLEAR     NO SQUAMOUS EPITHELIAL CELLS SEEN     NO ORGANISMS SEEN     Performed at [facility]   Culture     Final   Value: NO GROWTH 2 DAYS     Performed at [facility]   Report Status 01/30/2013 FINAL   Final  FUNGUS CULTURE W SMEAR     Status: None   Collection Time    01/28/13  3:36 PM      Result Value Range Status   Specimen Description WOUND PERICARDIAL   Final   Special Requests PATIENT ON FOLLOWING ZINACEF PERICARDIAL CLOT   Final   Fungal Smear     Final   Value: NO YEAST OR FUNGAL ELEMENTS SEEN     Performed at [facility]   Culture     Final   Value: CULTURE IN PROGRESS FOR FOUR WEEKS     Performed at [facility]   Report Status PENDING   Incomplete  AFB CULTURE WITH SMEAR     Status: None   Collection Time    01/28/13  3:36 PM      Result Value Range Status   Specimen Description WOUND PERICARDIAL   Final   Special Requests PATIENT ON FOLLOWING ZINACEF PERICARDIAL CLOT   Final   ACID FAST SMEAR     Final   Value: NO ACID FAST BACILLI SEEN     Performed at [facility]   Culture     Final   Value: CULTURE WILL BE EXAMINED FOR 6 WEEKS BEFORE ISSUING A FINAL REPORT     Performed at [facility]   Report Status PENDING   Incomplete  WOUND CULTURE     Status: None   Collection Time    01/28/13  3:36 PM      Result Value Range Status   Specimen Description WOUND PERICARDIAL   Final   Special Requests PATIENT ON FOLLOWING ZINACEF PERICARDIAL CLOT   Final   Gram Stain     Final   Value: MODERATE WBC PRESENT,BOTH PMN AND MONONUCLEAR     NO SQUAMOUS EPITHELIAL CELLS SEEN     NO ORGANISMS SEEN  Performed at [facility]      Final   Value: NO GROWTH 2 DAYS     Performed at [facility]   Report Status 01/30/2013 FINAL   Final  FUNGUS CULTURE W SMEAR     Status: None   Collection Time    01/28/13  3:36 PM      Result Value Range Status   Specimen Description WOUND PERICARDIAL   Final   Special Requests PATIENT ON FOLLOWING ZINACEF PERICARDIAL CLOT   Final   Fungal Smear     Final   Value: NO YEAST OR FUNGAL ELEMENTS SEEN     Performed at [facility]   Culture     Final   Value: CULTURE IN PROGRESS FOR FOUR WEEKS     Performed at [facility]   Report Status PENDING   Incomplete  ANAEROBIC CULTURE     Status: None   Collection Time    01/28/13  3:36 PM      Result Value Range Status   Specimen Description WOUND PERICARDIAL   Final   Special Requests PATIENT ON FOLLOWING ZINACEF PERICARDIAL CLOT   Final   Gram Stain     Final   Value: MODERATE WBC PRESENT,BOTH PMN AND MONONUCLEAR     NO SQUAMOUS EPITHELIAL CELLS SEEN     NO ORGANISMS SEEN     Performed at [facility]   Culture     Final   Value: NO ANAEROBES ISOLATED; CULTURE IN PROGRESS FOR 5 DAYS     Performed at [facility]   Report Status PENDING   Incomplete  ANAEROBIC CULTURE     Status: None   Collection Time    01/28/13  3:36 PM      Result Value Range Status   Specimen Description WOUND PERICARDIAL   Final   Special Requests PATIENT ON FOLLOWING ZINACEF PERICARDIAL CLOT   Final   Gram Stain     Final   Value: RARE WBC PRESENT,BOTH PMN AND MONONUCLEAR     NO SQUAMOUS EPITHELIAL CELLS SEEN     NO ORGANISMS SEEN     Performed at [facility]   Culture     Final   Value: NO ANAEROBES ISOLATED; CULTURE IN PROGRESS FOR 5 DAYS     Performed at [facility]   Report Status PENDING   Incomplete  AFB CULTURE WITH SMEAR     Status: None   Collection Time    01/28/13  3:42 PM      Result Value Range Status   Specimen Description TISSUE PERICARDIAL   Final   Special Requests NO 2  PT ON ZINACEF   Final   ACID FAST SMEAR     Final   Value: NO ACID FAST BACILLI SEEN     Performed at [facility]   Culture     Final   Value: CULTURE WILL BE EXAMINED FOR 6 WEEKS BEFORE ISSUING A FINAL REPORT     Performed at [facility]   Report Status PENDING   Incomplete  ANAEROBIC CULTURE     Status: None   Collection Time    01/28/13  3:42 PM      Result Value Range Status   Specimen Description TISSUE PERICARDIAL   Final   Special Requests NO 2 PT ON ZINACEF   Final   Gram Stain     Final   Value: FEW WBC PRESENT,BOTH  PMN AND MONONUCLEAR     NO ORGANISMS SEEN     Performed at [facility]   Culture     Final   Value: NO ANAEROBES ISOLATED; CULTURE IN PROGRESS FOR 5 DAYS     Performed at [facility]   Report Status PENDING   Incomplete  FUNGUS CULTURE W SMEAR     Status: None   Collection Time    01/28/13  3:42 PM      Result Value Range Status   Specimen Description TISSUE PERICARDIAL   Final   Special Requests NO 2 PT ON ZINACEF   Final   Fungal Smear     Final   Value: NO YEAST OR FUNGAL ELEMENTS SEEN     Performed at [facility]   Culture     Final   Value: CULTURE IN PROGRESS FOR FOUR WEEKS     Performed at [facility]   Report Status PENDING   Incomplete  TISSUE CULTURE     Status: None   Collection Time    01/28/13  3:42 PM      Result Value Range Status   Specimen Description TISSUE PERICARDIAL   Final   Special Requests NO 2 PT ON ZINACEF   Final   Gram Stain     Final   Value: FEW WBC PRESENT,BOTH PMN AND MONONUCLEAR     NO ORGANISMS SEEN     Performed at [facility]   Culture     Final   Value: NO GROWTH 3 DAYS     Performed at [facility]   Report Status 01/31/2013 FINAL   Final  AFB CULTURE WITH SMEAR     Status: None   Collection Time    01/28/13  3:48 PM      Result Value Range Status   Specimen Description TISSUE   Final   Special Requests NO 3 EPICARDIUM PT ON ZINACEF    Final   ACID FAST SMEAR     Final   Value: NO ACID FAST BACILLI SEEN     Performed at [facility]   Culture     Final   Value: CULTURE WILL BE EXAMINED FOR 6 WEEKS BEFORE ISSUING A FINAL REPORT     Performed at [facility]   Report Status PENDING   Incomplete  ANAEROBIC CULTURE     Status: None   Collection Time    01/28/13  3:48 PM      Result Value Range Status   Specimen Description TISSUE   Final   Special Requests NO 3 EPICARDIUM PT ON ZINACEF   Final   Gram Stain     Final   Value: FEW WBC PRESENT, PREDOMINANTLY PMN     NO ORGANISMS SEEN     Performed at [facility]   Culture     Final   Value: NO ANAEROBES ISOLATED; CULTURE IN PROGRESS FOR 5 DAYS     Performed at [facility]   Report Status PENDING   Incomplete  FUNGUS CULTURE W SMEAR     Status: None   Collection Time    01/28/13  3:48 PM      Result Value Range Status   Specimen Description TISSUE   Final   Special Requests NO 3 EPICARDIUM PT ON ZINACEF   Final   Fungal Smear     Final   Value: NO YEAST OR FUNGAL ELEMENTS SEEN  Performed at [facility]     Final   Value: CULTURE IN PROGRESS FOR FOUR WEEKS     Performed at [facility]   Report Status PENDING   Incomplete  TISSUE CULTURE     Status: None   Collection Time    01/28/13  3:48 PM      Result Value Range Status   Specimen Description TISSUE   Final   Special Requests NO 3 EPICARDIUM PT ON ZINACEF   Final   Gram Stain     Final   Value: FEW WBC PRESENT, PREDOMINANTLY PMN     NO ORGANISMS SEEN     Performed at [facility]   Culture     Final   Value: NO GROWTH 3 DAYS     Performed at [facility]   Report Status 01/31/2013 FINAL   Final  WOUND CULTURE     Status: None   Collection Time    01/28/13  4:05 PM      Result Value Range Status   Specimen Description WOUND   Final   Special Requests PATIENT ON FOLLOWING ZINACEF LEFT VENTRICULAR CLOT   Final   Gram Stain      Final   Value: MODERATE WBC PRESENT, PREDOMINANTLY PMN     NO SQUAMOUS EPITHELIAL CELLS SEEN     NO ORGANISMS SEEN     Performed at [facility]   Culture     Final   Value: NO GROWTH 2 DAYS     Performed at [facility]   Report Status 01/30/2013 FINAL   Final  AFB CULTURE WITH SMEAR     Status: None   Collection Time    01/28/13  4:05 PM      Result Value Range Status   Specimen Description WOUND   Final   Special Requests PATIENT ON FOLLOWING ZINACEF LEFT VENTRICULAR CLOT   Final   ACID FAST SMEAR     Final   Value: NO ACID FAST BACILLI SEEN     Performed at [facility]   Culture     Final   Value: CULTURE WILL BE EXAMINED FOR 6 WEEKS BEFORE ISSUING A FINAL REPORT     Performed at [facility]   Report Status PENDING   Incomplete  FUNGUS CULTURE W SMEAR     Status: None   Collection Time    01/28/13  4:05 PM      Result Value Range Status   Specimen Description WOUND   Final   Special Requests     Final   Value: PATIENT ON FOLLOWING ZINACEF LEFRT VENTRICULAR CLOT   Fungal Smear     Final   Value: NO YEAST OR FUNGAL ELEMENTS SEEN     Performed at [facility]   Culture     Final   Value: CULTURE IN PROGRESS FOR FOUR WEEKS     Performed at [facility]   Report Status PENDING   Incomplete    Anti-infectives   Start     Dose/Rate Route Frequency Ordered Stop   01/30/13 1330  vancomycin (VANCOCIN) 1,750 mg in sodium chloride 0.9 % 500 mL IVPB     1,750 mg 250 mL/hr over 120 Minutes Intravenous Every 24 hours 01/30/13 1316     01/29/13 1200  fluconazole (DIFLUCAN) IVPB 400 mg     400 mg 100 mL/hr over 120 Minutes Intravenous Every 24 hours 01/28/13 2141  01/29/13 1100  fluconazole (DIFLUCAN) IVPB 100 mg  Status:  Discontinued     100 mg 50 mL/hr over 60 Minutes Intravenous Every 24 hours 01/28/13 0842 01/28/13 2141   01/29/13 0600  imipenem-cilastatin (PRIMAXIN) 500 mg in sodium chloride 0.9 % 100 mL IVPB     500  mg 200 mL/hr over 30 Minutes Intravenous 3 times per day 01/29/13 0549     01/29/13 0545  vancomycin (VANCOCIN) 2,000 mg in sodium chloride 0.9 % 500 mL IVPB     2,000 mg 250 mL/hr over 120 Minutes Intravenous  Once 01/29/13 0539 01/29/13 0802   01/29/13 0315  vancomycin (VANCOCIN) IVPB 1000 mg/200 mL premix  Status:  Discontinued     1,000 mg 200 mL/hr over 60 Minutes Intravenous  Once 01/28/13 2107 01/28/13 2130   01/28/13 2315  cefUROXime (ZINACEF) 1.5 g in dextrose 5 % 50 mL IVPB  Status:  Discontinued     1.5 g 100 mL/hr over 30 Minutes Intravenous Every 12 hours 01/28/13 2107 01/28/13 2130   01/28/13 2200  vancomycin (VANCOCIN) IVPB 1000 mg/200 mL premix  Status:  Discontinued     1,000 mg 200 mL/hr over 60 Minutes Intravenous Every 12 hours 01/28/13 0842 01/28/13 1319   01/28/13 2200  imipenem-cilastatin (PRIMAXIN) 500 mg in sodium chloride 0.9 % 100 mL IVPB     500 mg 200 mL/hr over 30 Minutes Intravenous  Once 01/28/13 2140 01/28/13 2351   01/28/13 2115  vancomycin (VANCOCIN) 1,250 mg in sodium chloride 0.9 % 250 mL IVPB  Status:  Discontinued     1,250 mg 166.7 mL/hr over 90 Minutes Intravenous To Surgery 01/28/13 2107 01/28/13 2126   01/28/13 2115  cefUROXime (ZINACEF) 1.5 g in dextrose 5 % 50 mL IVPB  Status:  Discontinued     1.5 g 100 mL/hr over 30 Minutes Intravenous To Surgery 01/28/13 2107 01/28/13 2128   01/28/13 1400  piperacillin-tazobactam (ZOSYN) IVPB 3.375 g  Status:  Discontinued     3.375 g 12.5 mL/hr over 240 Minutes Intravenous 3 times per day 01/28/13 0826 01/28/13 0930   01/28/13 1400  cefTAZidime (FORTAZ) 2 g in dextrose 5 % 50 mL IVPB  Status:  Discontinued     2 g 100 mL/hr over 30 Minutes Intravenous 3 times per day 01/28/13 0930 01/28/13 1314   01/28/13 1300  vancomycin (VANCOCIN) 1,500 mg in sodium chloride 0.9 % 250 mL IVPB     1,500 mg 125 mL/hr over 120 Minutes Intravenous To Surgery 01/28/13 1255 01/28/13 1345   01/28/13 1300  cefUROXime  (ZINACEF) 1.5 g in dextrose 5 % 50 mL IVPB  Status:  Discontinued     1.5 g 100 mL/hr over 30 Minutes Intravenous To Surgery 01/28/13 1255 01/28/13 1843   01/28/13 1300  cefUROXime (ZINACEF) 750 mg in dextrose 5 % 50 mL IVPB  Status:  Discontinued     750 mg 100 mL/hr over 30 Minutes Intravenous To Surgery 01/28/13 1255 01/28/13 1843   01/28/13 1300  cefUROXime (ZINACEF) 750 mg in dextrose 5 % 50 mL IVPB  Status:  Discontinued     750 mg 100 mL/hr over 30 Minutes Intravenous To Surgery 01/28/13 1258 01/28/13 1320   01/28/13 1200  metroNIDAZOLE (FLAGYL) IVPB 500 mg  Status:  Discontinued     500 mg 100 mL/hr over 60 Minutes Intravenous 4 times per day 01/28/13 1047 01/28/13 1314   01/28/13 0930  [MAR Hold]  fluconazole (DIFLUCAN) IVPB 200 mg     (On  MAR Hold since 01/28/13 1330)   200 mg 100 mL/hr over 60 Minutes Intravenous  Once 01/28/13 0826 01/28/13 1337   01/28/13 0830  piperacillin-tazobactam (ZOSYN) IVPB 3.375 g  Status:  Discontinued     3.375 g 12.5 mL/hr over 240 Minutes Intravenous STAT 01/28/13 0826 01/28/13 1314   01/28/13 0830  vancomycin (VANCOCIN) 2,000 mg in sodium chloride 0.9 % 500 mL IVPB     2,000 mg 250 mL/hr over 120 Minutes Intravenous STAT 01/28/13 0826 01/28/13 1207      Assessment: 52 yo M admitted 01/21/2013 With CP and STE. Developed acute pericarditis and effusion with tamponode s/p pericardial drain removed 10/14, experienced left arm weakness, and subsequent vagal episode with bloody bowel movement, developed shaking chills, temp 102.5 and hypoxia. Pharmacy consulted to dose vancomycin, imipenem, & fluconazole  Anticoagulation: Lovenox 40/day  ID: R/o sepsis, Possible pericarditis, Tm 100.2, WBC 20.2  10/15 Vanc>> 10/15 Primaxin>> 10/15 Zosyn x 1 10/15 Fluconazole>>  10/15 Resp>>non-patho flora 10/15 Wound>>NGTD 10/15 Blood x 2>> 10/11 Body Fluid  Card: ACS s/p MI, s/p surgery for large pericardial effusion, on dopamine drip, off neo, on ASA 325,  lopressor  Endo: insulin drip>>>levemir/SSI, CBG < 140 GI: PO PPI ordered  Renal: CrCl 100, uop excellent, improved  Goal of Therapy:  Vancomycin trough level 15-20 mcg/ml  Plan: -Will change vancomycin to 1250 mg IV q12h with improving renal function   -Impipenem 500 mg IV q6h -Fluconazole 400 mg/day to target pericarditis -Follow up SCr, UOP, cultures, clinical course and adjust as clinically indicated   Thank you for allowing pharmacy to be a part of this patients care team.  Lovenia Kim Pharm.D., BCPS Clinical Pharmacist 01/31/2013 12:51 PM Pager: [phone] Phone: [phone]

## 2013-01-31 NOTE — Progress Notes (Signed)
3 Days Post-Op Procedure(s) (LRB): CORONARY ARTERY BYPASS GRAFTING (CABG) (N/A) INTRAOPERATIVE TRANSESOPHAGEAL ECHOCARDIOGRAM (N/A) LEFT VENTRICULAR ANEURYSM REPAIR (N/A) Subjective:  No complaints  Objective: Vital signs in last 24 hours: Temp:  [98.4 F (36.9 C)-98.8 F (37.1 C)] 98.5 F (36.9 C) (10/18 1537) Pulse Rate:  [101-118] 118 (10/18 1900) Cardiac Rhythm:  [-] Sinus tachycardia (10/18 0745) Resp:  [20-33] 24 (10/18 1900) BP: (94-132)/(39-83) 122/78 mmHg (10/18 1900) SpO2:  [91 %-99 %] 92 % (10/18 1900) Arterial Line BP: (85-138)/(56-114) 118/114 mmHg (10/18 1100) Weight:  [111.9 kg (246 lb 11.1 oz)] 111.9 kg (246 lb 11.1 oz) (10/18 0500)  Hemodynamic parameters for last 24 hours:    Intake/Output from previous day: 10/17 0701 - 10/18 0700 In: 2842.2 [I.V.:1342.2; IV Piggyback:1500] Out: 4255 [Urine:4225; Chest Tube:30] Intake/Output this shift:    General appearance: alert and cooperative Neurologic: left sided weakness Heart: regular rate and rhythm, S1, S2 normal, no murmur, click, rub or gallop Lungs: clear to auscultation bilaterally Abdomen: active BS, mildly distended, nontender Extremities: edema moderate Wound: incision ok  Lab Results:  Recent Labs  01/30/13 0400 01/31/13 0420  WBC 17.6* 20.2*  HGB 8.1* 9.0*  HCT 22.9* 26.7*  PLT 164 226   BMET:  Recent Labs  01/30/13 1645 01/31/13 0420  NA 142 146*  K 3.2* 3.5  CL 104 107  CO2 28 29  GLUCOSE 86 78  BUN 34* 29*  CREATININE 1.42* 1.15  CALCIUM 7.7* 7.9*    PT/INR:  Recent Labs  01/28/13 2050  LABPROT 20.3*  INR 1.79*   ABG    Component Value Date/Time   PHART 7.516* 01/30/2013 1103   HCO3 29.3* 01/30/2013 1103   TCO2 30 01/30/2013 1103   ACIDBASEDEF 4.0* 01/28/2013 2112   O2SAT 93.0 01/30/2013 1103   CBG (last 3)   Recent Labs  01/31/13 0709 01/31/13 1129 01/31/13 1536  GLUCAP 70 70 74    Assessment/Plan: S/P Procedure(s) (LRB): CORONARY ARTERY BYPASS  GRAFTING (CABG) (N/A) INTRAOPERATIVE TRANSESOPHAGEAL ECHOCARDIOGRAM (N/A) LEFT VENTRICULAR ANEURYSM REPAIR (N/A) He remains hemodynamically stable  Resp status stable.  Volume excess: continue diuresis with lasix drip and replete K+  OOB to chair. Will need continued PT and rehab.   LOS: 10 days    [name] K 01/31/2013

## 2013-01-31 NOTE — Progress Notes (Signed)
Stroke Team Progress Note  HISTORY KYLLIAN CLINGERMAN is an 52 y.o. male with a history of CAD s/p NSTEMI on 9/23 and admission 10/8 with recurrent chest pain secondary to acute pericarditis and cardiac tamponade (Dressler syndrome). Patient had a drain placed that was removed on the 14th due to decreased drainage. Patient has been doing well other than periods of hypotension until this morning after using the bathroom. At that time complained of being unable to use his left hand. Patient went to head CT and on return complained of difficulty seeing to the left as well.   Date last known well: Date: 01/28/2013  Time last known well: Time: 05:45  tPA Given: No: Patient with recent pericarditis and drainage   01/28/2013 Nursing reported respiratory distress, rigors, patient complained of not being able to move left hand with numbness to mid forearm, ataxia of left arm. CT ordered which showed the old left cerebellar infarct.  01/29/2013 Pericardial tamponade with clotted blood  Contained ruptured inferoposterior pseudoaneurysm  Extensive necrotic myocardium  3 x 5 cm defect, repaired    SUBJECTIVE Patient awake. Follows commands appropriately. Still flaccid left arm.     OBJECTIVE Most recent Vital Signs: Filed Vitals:   01/31/13 0600 01/31/13 0711 01/31/13 0800 01/31/13 0900  BP: 98/66  120/72 113/74  Pulse: 106  115 114  Temp:  98.4 F (36.9 C)    TempSrc:  Oral    Resp: 25  20 26   Height:      Weight:      SpO2: 95%  94% 98%   CBG (last 3)   Recent Labs  01/30/13 1941 01/31/13 0409 01/31/13 0709  GLUCAP 73 80 70    IV Fluid Intake:   . sodium chloride 20 mL/hr at 01/30/13 2000  . sodium chloride 20 mL/hr at 01/28/13 2100  . sodium chloride    . dexmedetomidine Stopped (01/30/13 0930)  . DOPamine 3 mcg/kg/min (01/30/13 2000)  . furosemide (LASIX) infusion 4 mg/hr (01/30/13 2000)  . insulin (NOVOLIN-R) infusion Stopped (01/29/13 2200)  . lactated ringers 20 mL/hr  at 01/28/13 2100  . nitroGLYCERIN Stopped (01/28/13 2100)  . norepinephrine (LEVOPHED) Adult infusion Stopped (01/29/13 1800)    MEDICATIONS  . acetaminophen  1,000 mg Oral Q6H   Or  . acetaminophen (TYLENOL) oral liquid 160 mg/5 mL  1,000 mg Per Tube Q6H  . artificial tears  1 application Both Eyes Q8H  . aspirin EC  325 mg Oral Daily   Or  . aspirin  324 mg Per Tube Daily  . bisacodyl  10 mg Oral Daily   Or  . bisacodyl  10 mg Rectal Daily  . docusate sodium  200 mg Oral Daily  . enoxaparin (LOVENOX) injection  40 mg Subcutaneous Q24H  . fluconazole (DIFLUCAN) IV  400 mg Intravenous Q24H  . imipenem-cilastatin  500 mg Intravenous Q8H  . insulin aspart  0-24 Units Subcutaneous Q4H  . insulin detemir  30 Units Subcutaneous Daily  . insulin regular  0-10 Units Intravenous TID WC  . magnesium sulfate  4 g Intravenous Once  . metoprolol tartrate  12.5 mg Oral BID   Or  . metoprolol tartrate  12.5 mg Per Tube BID  . pantoprazole  40 mg Oral Daily  . sodium chloride  3 mL Intravenous Q12H  . vancomycin  1,750 mg Intravenous Q24H   PRN:  metoprolol, midazolam, morphine injection, ondansetron (ZOFRAN) IV, oxyCODONE, sodium chloride  Diet:  Dysphagia  NPO Activity:  Bedrest  DVT Prophylaxis:  SCD  CLINICALLY SIGNIFICANT STUDIES Basic Metabolic Panel:   Recent Labs Lab 01/29/13 0201 01/29/13 1700  01/30/13 1645 01/31/13 0420  NA 142  --   < > 142 146*  K 4.8  --   < > 3.2* 3.5  CL 105  --   < > 104 107  CO2 26  --   < > 28 29  GLUCOSE 145*  --   < > 86 78  BUN 28*  --   < > 34* 29*  CREATININE 1.83* 1.87*  < > 1.42* 1.15  CALCIUM 7.0*  --   < > 7.7* 7.9*  MG 1.9 2.0  --   --   --   < > = values in this interval not displayed. Liver Function Tests:  No results found for this basename: AST, ALT, ALKPHOS, BILITOT, PROT, ALBUMIN,  in the last 168 hours CBC:   Recent Labs Lab 01/30/13 0400 01/31/13 0420  WBC 17.6* 20.2*  HGB 8.1* 9.0*  HCT 22.9* 26.7*  MCV 85.1 88.4   PLT 164 226   Coagulation:   Recent Labs Lab 01/28/13 1059 01/28/13 1858 01/28/13 2011 01/28/13 2050  LABPROT 16.4* 26.1* 21.4* 20.3*  INR 1.36 2.49* 1.92* 1.79*   Cardiac Enzymes:   Recent Labs Lab 01/29/13 0715  CKTOTAL 436*  CKMB 28.0*   Urinalysis:   Recent Labs Lab 01/28/13 0847  COLORURINE ORANGE*  LABSPEC 1.030  PHURINE 5.0  GLUCOSEU NEGATIVE  HGBUR NEGATIVE  BILIRUBINUR MODERATE*  KETONESUR 15*  PROTEINUR 30*  UROBILINOGEN 4.0*  NITRITE POSITIVE*  LEUKOCYTESUR TRACE*   Lipid Panel    Component Value Date/Time   CHOL 164 01/06/2013 0511   TRIG 109 01/06/2013 0511   HDL 44 01/06/2013 0511   CHOLHDL 3.7 01/06/2013 0511   VLDL 22 01/06/2013 0511   LDLCALC 98 01/06/2013 0511   HgbA1C  Lab Results  Component Value Date   HGBA1C 5.7* 01/05/2013    Urine Drug Screen:     Component Value Date/Time   LABOPIA NONE DETECTED 01/05/2013 2053   COCAINSCRNUR NONE DETECTED 01/05/2013 2053   LABBENZ NONE DETECTED 01/05/2013 2053   AMPHETMU NONE DETECTED 01/05/2013 2053   THCU NONE DETECTED 01/05/2013 2053   LABBARB NONE DETECTED 01/05/2013 2053    Alcohol Level: No results found for this basename: ETH,  in the last 168 hours  Ct Head Wo Contrast 01/28/2013   No acute abnormality. Old left cerebellar infarct. Chronic small vessel ischemic changes.    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 The effusion is located anteriorly around the RV free wall. Maximum diameter in diastole is 10 mm. There is no collapse of the right ventricle in diastole. IVC is dilated with less than 50% respiratory variations. There are no signs of tamponade. There is akinesis of the basal and mid inferior and  inferoseptal walls and apical inferior wall. Overall left ventricular ejection fraction, 40-45 % 01/24/2013 A moderate, free-flowing pericardial effusionwas identified circumferential to the heart, predominantly anterior.The fluid contained focal strands. Doppler: The possibility of hemodynamic compromise cannot be excluded on the basis of available images. Significant mitral inflow variation is noted. The IVC does not collapse and the patient  is tachycardic. 10/10/2014Left ventricle: Akinesis inferolateral wall. Dyskinesis base inferior wall. The cavity size was normal. Wall thickness was increased in a pattern of mild LVH. The estimated ejection fraction was 35%. - Right ventricle: The cavity size was moderately dilated. Systolic function was moderately reduced. - Right atrium: The atrium was mildly dilated. - Pericardium, extracardiac: Pericardial effusion is only moderate, but it is larger than yesterday. There is some RV collapse. I am concerned about possible tamponade heodynamics.   Carotid Doppler    CXR    Therapy Recommendations   Filed Vitals:   01/31/13 0900  BP: 113/74  Pulse: 114  Temp:   Resp: 26   Physical Exam  General: The patient is alert and cooperative at the time of the examination.  Skin: 2+ edema present on all extremities.   Neurologic Exam  Cranial nerves: Facial symmetry is not present. Slight depression of the left nasolabial fold. The patient has good understanding her speech, slight dysarthria. Extraocular movements are full. Visual fields are full.  Motor: The patient has good strength in the right extremities. On the left, the patient has an almost plegic left arm, 4/5 strength of the left lower extremity. Coordination: The patient has good finger-nose-finger and toe to finger on the right, unable to perform on the left..  Gait and station: The patient could not be ambulated.  Reflexes: Deep tendon reflexes are symmetric, but are  depressed   ASSESSMENT Mr. JORMA TASSINARI is a 52 y.o. male presenting with left arm weakness, numbness and left visual field deficits. CT shows no acute stroke. Symptoms secondary to unknown source but sepsis suspected.  On aspirin 81 mg orally every day and clopidogrel 75 mg orally every day prior to admission. Currently off antithrombotics due to bleeding risk. Work up underway.   Respiratory distress, intubated  Recurrent tamponade with inferiorposterior pseudoaneurysm, necrosis, s/p OR pod # 2  Sepsis, on IV abx  LDL 98  HGB A1C   5.7  Former smoker  Coronary artery disease, s/p stent  Hospital day # 10  TREATMENT/PLAN  MRI/A Brain when patient stabilizes, can be done when patient is stable to go off floor  Asprin 300mg  rectally every day started for cardiac/secondary stroke prevention  Risk factor modification  ID following  Clevester Helzer KEITH  01/31/2013 10:19 AM

## 2013-02-01 LAB — TYPE AND SCREEN
ABO/RH(D): O POS
Antibody Screen: NEGATIVE
Unit division: 0
Unit division: 0
Unit division: 0
Unit division: 0
Unit division: 0
Unit division: 0
Unit division: 0
Unit division: 0
Unit division: 0
Unit division: 0

## 2013-02-01 LAB — GLUCOSE, CAPILLARY
Glucose-Capillary: 89 mg/dL (ref 70–99)
Glucose-Capillary: 85 mg/dL (ref 70–99)
Glucose-Capillary: 91 mg/dL (ref 70–99)

## 2013-02-01 LAB — CBC
Hemoglobin: 9.7 g/dL — ABNORMAL LOW (ref 13.0–17.0)
Platelets: 264 10*3/uL (ref 150–400)
RBC: 3.19 MIL/uL — ABNORMAL LOW (ref 4.22–5.81)
WBC: 17.1 10*3/uL — ABNORMAL HIGH (ref 4.0–10.5)

## 2013-02-01 LAB — BASIC METABOLIC PANEL
Calcium: 8.2 mg/dL — ABNORMAL LOW (ref 8.4–10.5)
Chloride: 98 mEq/L (ref 96–112)
GFR calc Af Amer: >90 mL/min (ref >90)
GFR calc non Af Amer: >90 mL/min (ref >90)
Glucose, Bld: 85 mg/dL (ref 70–99)
Potassium: 3.4 mEq/L — ABNORMAL LOW (ref 3.5–5.1)
Sodium: 138 mEq/L (ref 135–145)

## 2013-02-01 LAB — POCT I-STAT 4, (NA,K, GLUC, HGB,HCT)
Glucose, Bld: 95 mg/dL (ref 70–99)
HCT: 30 % — ABNORMAL LOW (ref 39.0–52.0)
Potassium: 3.6 mEq/L (ref 3.5–5.1)
Sodium: 138 mEq/L (ref 135–145)

## 2013-02-01 MED ORDER — POTASSIUM CHLORIDE 10 MEQ/50ML IV SOLN
10.0000 meq | INTRAVENOUS | Status: AC
  Administered 2013-02-01 (×3): 10 meq via INTRAVENOUS
  Filled 2013-02-01: qty 50

## 2013-02-01 MED ORDER — POTASSIUM CHLORIDE 10 MEQ/50ML IV SOLN
10.0000 meq | INTRAVENOUS | Status: AC | PRN
  Administered 2013-02-01 (×3): 10 meq via INTRAVENOUS
  Filled 2013-02-01 (×2): qty 50

## 2013-02-01 MED ORDER — POTASSIUM CHLORIDE 10 MEQ/50ML IV SOLN
10.0000 meq | INTRAVENOUS | Status: AC
  Administered 2013-02-01 (×4): 10 meq via INTRAVENOUS
  Filled 2013-02-01: qty 50

## 2013-02-01 MED ORDER — DOPAMINE-DEXTROSE 3.2-5 MG/ML-% IV SOLN
3.0000 ug/kg/min | INTRAVENOUS | Status: DC
  Administered 2013-02-01: 3 ug/kg/min via INTRAVENOUS
  Filled 2013-02-01: qty 250

## 2013-02-01 NOTE — Progress Notes (Signed)
Patient ID: [name], male   DOB: 03-23-1961, 52 y.o.   MRN: [id number]  SICU Evening Rounds:  Hemodynamically stable but has persistent resting tachycardia.  Dopamine off  Diuresing with lasix drip. BMET    Component Value Date/Time   NA 138 02/01/2013 1600   K 3.6 02/01/2013 1600   CL 98 02/01/2013 0420   CO2 29 02/01/2013 0420   GLUCOSE 95 02/01/2013 1600   BUN 21 02/01/2013 0420   CREATININE 0.92 02/01/2013 0420   CALCIUM 8.2* 02/01/2013 0420   GFRNONAA >90 02/01/2013 0420   GFRAA >90 02/01/2013 0420    K 3.6 this afternoon. Replete

## 2013-02-01 NOTE — Progress Notes (Signed)
4 Days Post-Op Procedure(s) (LRB): CORONARY ARTERY BYPASS GRAFTING (CABG) (N/A) INTRAOPERATIVE TRANSESOPHAGEAL ECHOCARDIOGRAM (N/A) LEFT VENTRICULAR ANEURYSM REPAIR (N/A) Subjective:  No complaints  Objective: Vital signs in last 24 hours: Temp:  [98.2 F (36.8 C)-99.1 F (37.3 C)] 98.2 F (36.8 C) (10/19 0719) Pulse Rate:  [101-121] 116 (10/19 1000) Cardiac Rhythm:  [-] Sinus tachycardia (10/19 0748) Resp:  [19-35] 35 (10/19 1000) BP: (94-132)/(39-86) 99/64 mmHg (10/19 1000) SpO2:  [90 %-96 %] 90 % (10/19 1000) Arterial Line BP: (118)/(114) 118/114 mmHg (10/18 1100) Weight:  [103.1 kg (227 lb 4.7 oz)] 103.1 kg (227 lb 4.7 oz) (10/19 0600)  Hemodynamic parameters for last 24 hours:    Intake/Output from previous day: 10/18 0701 - 10/19 0700 In: 3264.6 [P.O.:720; I.V.:1244.6; IV Piggyback:1300] Out: 1610 [RUEAV:4098; Stool:4] Intake/Output this shift: Total I/O In: 552.6 [P.O.:240; I.V.:162.6; IV Piggyback:150] Out: 770 [Urine:770]  General appearance: alert and cooperative Neurologic: left sided weakness. No movement LUE. Left leg weak but can bend it up. Heart: regular rate and rhythm, S1, S2 normal, no murmur, click, rub or gallop Lungs: clear to auscultation bilaterally Abdomen: soft, non-tender; bowel sounds normal; no masses,  no organomegaly and mildly distended. Extremities: edema moderate Wound: incision ok  Lab Results:  Recent Labs  01/31/13 0420 02/01/13 0420  WBC 20.2* 17.1*  HGB 9.0* 9.7*  HCT 26.7* 28.5*  PLT 226 264   BMET:  Recent Labs  01/31/13 0420 02/01/13 0420  NA 146* 138  K 3.5 3.4*  CL 107 98  CO2 29 29  GLUCOSE 78 85  BUN 29* 21  CREATININE 1.15 0.92  CALCIUM 7.9* 8.2*    PT/INR: No results found for this basename: LABPROT, INR,  in the last 72 hours ABG    Component Value Date/Time   PHART 7.516* 01/30/2013 1103   HCO3 29.3* 01/30/2013 1103   TCO2 30 01/30/2013 1103   ACIDBASEDEF 4.0* 01/28/2013 2112   O2SAT 93.0  01/30/2013 1103   CBG (last 3)   Recent Labs  01/31/13 2342 02/01/13 0356 02/01/13 0716  GLUCAP 73 85 91    Assessment/Plan: S/P Procedure(s) (LRB): CORONARY ARTERY BYPASS GRAFTING (CABG) (N/A) INTRAOPERATIVE TRANSESOPHAGEAL ECHOCARDIOGRAM (N/A) LEFT VENTRICULAR ANEURYSM REPAIR (N/A)  Hemodynamically stable Creatinine back to normal. Wean off dopamine. Continue lasix drip and replace K+. Keep foley in for diuresis with limited mobility. PT, IS, OOB   LOS: 11 days    BARTLE,BRYAN K 02/01/2013

## 2013-02-01 NOTE — Progress Notes (Signed)
Stroke Team Progress Note  HISTORY [name] is an 52 y.o. male with a history of CAD s/p NSTEMI on 9/23 and admission 10/8 with recurrent chest pain secondary to acute pericarditis and cardiac tamponade (Dressler syndrome). Patient had a drain placed that was removed on the 14th due to decreased drainage. Patient has been doing well other than periods of hypotension until this morning after using the bathroom. At that time complained of being unable to use his left hand. Patient went to head CT and on return complained of difficulty seeing to the left as well.   Date last known well: Date: 01/28/2013  Time last known well: Time: 05:45  tPA Given: No: Patient with recent pericarditis and drainage   01/28/2013 Nursing reported respiratory distress, rigors, patient complained of not being able to move left hand with numbness to mid forearm, ataxia of left arm. CT ordered which showed the old left cerebellar infarct.  01/29/2013 Pericardial tamponade with clotted blood  Contained ruptured inferoposterior pseudoaneurysm  Extensive necrotic myocardium  3 x 5 cm defect, repaired    SUBJECTIVE Patient awake. Follows commands appropriately. Still flaccid left arm. The patient is up in a chair today.     OBJECTIVE Most recent Vital Signs: Filed Vitals:   02/01/13 0719 02/01/13 0800 02/01/13 0900 02/01/13 1000  BP:  118/69 122/75 99/64  Pulse:  114 109 116  Temp: 98.2 F (36.8 C)     TempSrc: Oral     Resp:  23 28 35  Height:      Weight:      SpO2:  95% 94% 90%   CBG (last 3)   Recent Labs  01/31/13 2342 02/01/13 0356 02/01/13 0716  GLUCAP 73 85 91    IV Fluid Intake:   . sodium chloride 20 mL/hr at 01/30/13 2000  . sodium chloride 20 mL/hr at 01/28/13 2100  . sodium chloride    . dexmedetomidine Stopped (01/30/13 0930)  . DOPamine 3 mcg/kg/min (02/01/13 0830)  . furosemide (LASIX) infusion 8 mg/hr (02/01/13 0829)  . lactated ringers 20 mL/hr at 01/28/13 2100     MEDICATIONS  . acetaminophen  1,000 mg Oral Q6H   Or  . acetaminophen (TYLENOL) oral liquid 160 mg/5 mL  1,000 mg Per Tube Q6H  . aspirin EC  325 mg Oral Daily   Or  . aspirin  324 mg Per Tube Daily  . bisacodyl  10 mg Oral Daily   Or  . bisacodyl  10 mg Rectal Daily  . docusate sodium  200 mg Oral Daily  . enoxaparin (LOVENOX) injection  40 mg Subcutaneous Q24H  . fluconazole (DIFLUCAN) IV  400 mg Intravenous Q24H  . imipenem-cilastatin  500 mg Intravenous Q6H  . insulin aspart  0-24 Units Subcutaneous Q4H  . magnesium sulfate  4 g Intravenous Once  . metoprolol tartrate  25 mg Oral BID   Or  . metoprolol tartrate  12.5 mg Per Tube BID  . pantoprazole  40 mg Oral Daily  . sodium chloride  3 mL Intravenous Q12H  . vancomycin  1,250 mg Intravenous Q12H   PRN:  metoprolol, morphine injection, ondansetron (ZOFRAN) IV, oxyCODONE, sodium chloride  Diet:  Dysphagia  NPO Activity:  Bedrest DVT Prophylaxis:  SCD  CLINICALLY SIGNIFICANT STUDIES Basic Metabolic Panel:   Recent Labs Lab 01/29/13 0201 01/29/13 1700  01/31/13 0420 02/01/13 0420  NA 142  --   < > 146* 138  K 4.8  --   < >  3.5 3.4*  CL 105  --   < > 107 98  CO2 26  --   < > 29 29  GLUCOSE 145*  --   < > 78 85  BUN 28*  --   < > 29* 21  CREATININE 1.83* 1.87*  < > 1.15 0.92  CALCIUM 7.0*  --   < > 7.9* 8.2*  MG 1.9 2.0  --   --   --   < > = values in this interval not displayed. Liver Function Tests:  No results found for this basename: AST, ALT, ALKPHOS, BILITOT, PROT, ALBUMIN,  in the last 168 hours CBC:   Recent Labs Lab 01/31/13 0420 02/01/13 0420  WBC 20.2* 17.1*  HGB 9.0* 9.7*  HCT 26.7* 28.5*  MCV 88.4 89.3  PLT 226 264   Coagulation:   Recent Labs Lab 01/28/13 1059 01/28/13 1858 01/28/13 2011 01/28/13 2050  LABPROT 16.4* 26.1* 21.4* 20.3*  INR 1.36 2.49* 1.92* 1.79*   Cardiac Enzymes:   Recent Labs Lab 01/29/13 0715  CKTOTAL 436*  CKMB 28.0*   Urinalysis:   Recent  Labs Lab 01/28/13 0847  COLORURINE ORANGE*  LABSPEC 1.030  PHURINE 5.0  GLUCOSEU NEGATIVE  HGBUR NEGATIVE  BILIRUBINUR MODERATE*  KETONESUR 15*  PROTEINUR 30*  UROBILINOGEN 4.0*  NITRITE POSITIVE*  LEUKOCYTESUR TRACE*   Lipid Panel    Component Value Date/Time   CHOL 164 01/06/2013 0511   TRIG 109 01/06/2013 0511   HDL 44 01/06/2013 0511   CHOLHDL 3.7 01/06/2013 0511   VLDL 22 01/06/2013 0511   LDLCALC 98 01/06/2013 0511   HgbA1C  Lab Results  Component Value Date   HGBA1C 5.7* 01/05/2013    Urine Drug Screen:     Component Value Date/Time   LABOPIA NONE DETECTED 01/05/2013 2053   COCAINSCRNUR NONE DETECTED 01/05/2013 2053   LABBENZ NONE DETECTED 01/05/2013 2053   AMPHETMU NONE DETECTED 01/05/2013 2053   THCU NONE DETECTED 01/05/2013 2053   LABBARB NONE DETECTED 01/05/2013 2053    Alcohol Level: No results found for this basename: ETH,  in the last 168 hours  Ct Head Wo Contrast 01/28/2013   No acute abnormality. Old left cerebellar infarct. Chronic small vessel ischemic changes.    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 The effusion is located anteriorly around the RV free wall. Maximum diameter in diastole is 10 mm. There is no collapse of the right ventricle in diastole. IVC is dilated with less than 50% respiratory variations. There are no signs of tamponade. There is akinesis of the basal and mid inferior and inferoseptal walls and apical inferior wall. Overall left ventricular ejection fraction, 40-45 % 01/24/2013 A moderate, free-flowing pericardial effusionwas identified circumferential to the heart, predominantly anterior.The fluid contained focal strands. Doppler: The possibility of  hemodynamic compromise cannot be excluded on the basis of available images. Significant mitral inflow variation is noted. The IVC does not collapse and the patient is tachycardic. 10/10/2014Left ventricle: Akinesis inferolateral wall. Dyskinesis base inferior wall. The cavity size was normal. Wall thickness was increased in a pattern of mild LVH. The estimated ejection fraction was 35%. - Right ventricle: The cavity size was moderately dilated. Systolic function was moderately  reduced. - Right atrium: The atrium was mildly dilated. - Pericardium, extracardiac: Pericardial effusion is only moderate, but it is larger than yesterday. There is some RV collapse. I am concerned about possible tamponade heodynamics.   Carotid Doppler    CXR    Therapy Recommendations   Filed Vitals:   02/01/13 1000  BP: 99/64  Pulse: 116  Temp:   Resp: 35   Physical Exam  General: The patient is alert and cooperative at the time of the examination.  Skin: 2+ edema present on all extremities.   Neurologic Exam  Cranial nerves: Facial symmetry is not present. Slight depression of the left nasolabial fold. The patient has good understanding her speech, slight dysarthria. Extraocular movements are full. Visual fields are full.  Motor: The patient has good strength in the right extremities. On the left, the patient has an almost plegic left arm, 4/5 strength of the left lower extremity. Coordination: The patient has good finger-nose-finger and toe to finger on the right, unable to perform on the left..  Gait and station: The patient could not be ambulated.  Reflexes: Deep tendon reflexes are symmetric, but are depressed   ASSESSMENT [name] is a 52 y.o. male presenting with left arm weakness, numbness and left visual field deficits. CT shows no acute stroke. Symptoms secondary to unknown source but sepsis suspected.  On aspirin 81 mg orally every day and clopidogrel 75 mg orally every day  prior to admission. Currently off antithrombotics due to bleeding risk. Work up underway.   Respiratory distress, intubated  Recurrent tamponade with inferiorposterior pseudoaneurysm, necrosis, s/p OR pod # 2  Sepsis, on IV abx  LDL 98  HGB A1C   5.7  Former smoker  Coronary artery disease, s/p stent  Hospital day # 11  TREATMENT/PLAN  MRI/A Brain when patient stabilizes, can be done when patient is stable to go off floor will order MRI, MRA today  Asprin 300mg  rectally every day started for cardiac/secondary stroke prevention  Risk factor modification  ID following  WILLIS,CHARLES KEITH  02/01/2013 10:23 AM

## 2013-02-02 ENCOUNTER — Inpatient Hospital Stay (HOSPITAL_COMMUNITY): Payer: Medicaid Other

## 2013-02-02 LAB — BASIC METABOLIC PANEL
BUN: 17 mg/dL (ref 6–23)
Calcium: 8.5 mg/dL (ref 8.4–10.5)
Chloride: 94 mEq/L — ABNORMAL LOW (ref 96–112)
GFR calc Af Amer: >90 mL/min (ref >90)
GFR calc non Af Amer: >90 mL/min (ref >90)
Potassium: 3.2 mEq/L — ABNORMAL LOW (ref 3.5–5.1)
Sodium: 135 mEq/L (ref 135–145)

## 2013-02-02 LAB — ANAEROBIC CULTURE

## 2013-02-02 LAB — GLUCOSE, CAPILLARY: Glucose-Capillary: 120 mg/dL — ABNORMAL HIGH (ref 70–99)

## 2013-02-02 LAB — POCT I-STAT 4, (NA,K, GLUC, HGB,HCT)
Glucose, Bld: 98 mg/dL (ref 70–99)
Potassium: 3.3 mEq/L — ABNORMAL LOW (ref 3.5–5.1)
Sodium: 135 mEq/L (ref 135–145)

## 2013-02-02 LAB — CBC
HCT: 29.5 % — ABNORMAL LOW (ref 39.0–52.0)
Hemoglobin: 9.9 g/dL — ABNORMAL LOW (ref 13.0–17.0)
RBC: 3.31 MIL/uL — ABNORMAL LOW (ref 4.22–5.81)

## 2013-02-02 LAB — PRO B NATRIURETIC PEPTIDE: Pro B Natriuretic peptide (BNP): 2496 pg/mL — ABNORMAL HIGH (ref 0–125)

## 2013-02-02 MED ORDER — IOHEXOL 350 MG/ML SOLN
80.0000 mL | Freq: Once | INTRAVENOUS | Status: AC | PRN
  Administered 2013-02-02: 80 mL via INTRAVENOUS

## 2013-02-02 MED ORDER — POTASSIUM CHLORIDE 10 MEQ/50ML IV SOLN
10.0000 meq | INTRAVENOUS | Status: AC | PRN
  Administered 2013-02-02 (×3): 10 meq via INTRAVENOUS

## 2013-02-02 MED ORDER — POTASSIUM CHLORIDE 10 MEQ/50ML IV SOLN
INTRAVENOUS | Status: AC
  Filled 2013-02-02: qty 50

## 2013-02-02 MED ORDER — POTASSIUM CHLORIDE 10 MEQ/50ML IV SOLN
10.0000 meq | INTRAVENOUS | Status: AC
  Administered 2013-02-02 (×2): 10 meq via INTRAVENOUS

## 2013-02-02 MED ORDER — POTASSIUM CHLORIDE 10 MEQ/50ML IV SOLN
10.0000 meq | INTRAVENOUS | Status: AC
  Administered 2013-02-02 (×2): 10 meq via INTRAVENOUS
  Filled 2013-02-02 (×2): qty 50

## 2013-02-02 MED ORDER — FUROSEMIDE 10 MG/ML IJ SOLN
40.0000 mg | Freq: Two times a day (BID) | INTRAMUSCULAR | Status: DC
  Administered 2013-02-02 – 2013-02-03 (×3): 40 mg via INTRAVENOUS
  Filled 2013-02-02 (×5): qty 4

## 2013-02-02 MED ORDER — ENSURE COMPLETE PO LIQD
237.0000 mL | Freq: Three times a day (TID) | ORAL | Status: DC
  Administered 2013-02-02 – 2013-02-04 (×5): 237 mL via ORAL

## 2013-02-02 MED ORDER — POTASSIUM CHLORIDE 10 MEQ/50ML IV SOLN
INTRAVENOUS | Status: AC
  Administered 2013-02-02: 10 meq
  Filled 2013-02-02: qty 150

## 2013-02-02 MED ORDER — METOPROLOL TARTRATE 25 MG PO TABS
25.0000 mg | ORAL_TABLET | Freq: Three times a day (TID) | ORAL | Status: DC
  Administered 2013-02-02 – 2013-02-04 (×8): 25 mg via ORAL
  Filled 2013-02-02 (×9): qty 1

## 2013-02-02 NOTE — Progress Notes (Addendum)
NUTRITION FOLLOW UP  Intervention:    Ensure Complete 3 times daily (350 kcals, 13 gm protein per 8 fl oz bottle) RD to follow for nutrition care plan  Nutrition Dx:   Inadequate oral intake now related to dysphagia, poor appetite as evidenced by PO intake 20%, ongoing  Goal:   Pt to meet >/= 90% of their estimated nutrition needs, currently unmet  Monitor:   PO & supplemental intake, weight, labs, I/O's  Assessment:   Patient admitted with recurrent chest pain, found to have STEMI; s/p drain placement for pericardial effusion, removed (10/14); Neurology consulted due to L arm weakness and numbness; code stroke 10/15 AM -- CT showed the old left cerebellar infarct.   Patient s/p procedure 10/16:  CORONARY ARTERY BYPASS GRAFTING (CABG)  REPAIR LV RUPTURED INFEROPOSTERIOR PSEUDOANEURYSM WITH CORE-MATRIX PATCH  INTRAOPERATIVE TRANSESOPHAGEAL ECHOCARDIOGRAM  [facility] LEFT THIGH  Patient s/p bedside swallow evaluation 10/17 -- advanced to Dysphagia 1, thin liquid diet.  Patient reports his appetite is doing OK.  PO intake poor at 20% per flowsheet records. Would benefit from addition of nutrition supplements -- patient amenable.  Disposition: CIR recommended.  Height: Ht Readings from Last 1 Encounters:  01/21/13 6\' 1"  (1.854 m)    Weight Status:   Wt Readings from Last 1 Encounters:  02/02/13 225 lb 8.5 oz (102.3 kg)    Re-estimated needs:  Kcal: 2200-2400 Protein: 120-130 gm Fluid: 2.2-2.4 L  Skin: chest & leg surgical incision   Diet Order: Dysphagia 1, thin liquids   Intake/Output Summary (Last 24 hours) at 02/02/13 1047 Last data filed at 02/02/13 0900  Gross per 24 hour  Intake 2526.3 ml  Output   5584 ml  Net -3057.7 ml    Labs:   Recent Labs Lab 01/29/13 0158 01/29/13 0201 01/29/13 1700  01/31/13 0420 02/01/13 0420 02/01/13 1600 02/02/13 0327  NA 139 142  --   < > 146* 138 138 135  K 4.7 4.8  --   < > 3.5 3.4* 3.6 3.2*  CL 106 105  --   < > 107 98   --  94*  CO2  --  26  --   < > 29 29  --  29  BUN 27* 28*  --   < > 29* 21  --  17  CREATININE 2.00* 1.83* 1.87*  < > 1.15 0.92  --  0.88  CALCIUM  --  7.0*  --   < > 7.9* 8.2*  --  8.5  MG  --  1.9 2.0  --   --   --   --   --   GLUCOSE 143* 145*  --   < > 78 85 95 114*  < > = values in this interval not displayed.  CBG (last 3)   Recent Labs  02/01/13 0356 02/01/13 0716 02/02/13 0750  GLUCAP 85 91 120*    Scheduled Meds: . acetaminophen  1,000 mg Oral Q6H   Or  . acetaminophen (TYLENOL) oral liquid 160 mg/5 mL  1,000 mg Per Tube Q6H  . aspirin EC  325 mg Oral Daily   Or  . aspirin  324 mg Per Tube Daily  . bisacodyl  10 mg Oral Daily   Or  . bisacodyl  10 mg Rectal Daily  . docusate sodium  200 mg Oral Daily  . enoxaparin (LOVENOX) injection  40 mg Subcutaneous Q24H  . fluconazole (DIFLUCAN) IV  400 mg Intravenous Q24H  . furosemide  40  mg Intravenous BID  . imipenem-cilastatin  500 mg Intravenous Q6H  . magnesium sulfate  4 g Intravenous Once  . metoprolol tartrate  25 mg Oral TID  . pantoprazole  40 mg Oral Daily  . potassium chloride      . sodium chloride  3 mL Intravenous Q12H  . vancomycin  1,250 mg Intravenous Q12H    Continuous Infusions: . sodium chloride 20 mL/hr at 01/30/13 2000  . sodium chloride 20 mL/hr at 01/28/13 2100  . sodium chloride    . dexmedetomidine Stopped (01/30/13 0930)  . DOPamine 3 mcg/kg/min (02/01/13 0830)  . lactated ringers 20 mL/hr at 01/28/13 2100    Maureen Chatters, RD, LDN Pager #: [phone] After-Hours Pager #: [phone]

## 2013-02-02 NOTE — Progress Notes (Signed)
[facility]  Date of Admission:  01/21/2013  Antibiotics: Antibiotics Given (last 72 hours)   Date/Time Action Medication Dose Rate   01/30/13 1500 Given   imipenem-cilastatin (PRIMAXIN) 500 mg in sodium chloride 0.9 % 100 mL IVPB 500 mg 200 mL/hr   01/30/13 2120 Given   imipenem-cilastatin (PRIMAXIN) 500 mg in sodium chloride 0.9 % 100 mL IVPB 500 mg 200 mL/hr   01/31/13 0519 Given   imipenem-cilastatin (PRIMAXIN) 500 mg in sodium chloride 0.9 % 100 mL IVPB 500 mg 200 mL/hr   01/31/13 1301 Given   imipenem-cilastatin (PRIMAXIN) 500 mg in sodium chloride 0.9 % 100 mL IVPB 500 mg 200 mL/hr   01/31/13 1339 Given   vancomycin (VANCOCIN) 1,250 mg in sodium chloride 0.9 % 250 mL IVPB 1,250 mg 166.7 mL/hr   01/31/13 2030 Given   imipenem-cilastatin (PRIMAXIN) 500 mg in sodium chloride 0.9 % 100 mL IVPB 500 mg 200 mL/hr   02/01/13 0011 Given   vancomycin (VANCOCIN) 1,250 mg in sodium chloride 0.9 % 250 mL IVPB 1,250 mg 166.7 mL/hr   02/01/13 0230 Given   imipenem-cilastatin (PRIMAXIN) 500 mg in sodium chloride 0.9 % 100 mL IVPB 500 mg 200 mL/hr   02/01/13 0731 Given   imipenem-cilastatin (PRIMAXIN) 500 mg in sodium chloride 0.9 % 100 mL IVPB 500 mg 200 mL/hr   02/01/13 1200 Given   vancomycin (VANCOCIN) 1,250 mg in sodium chloride 0.9 % 250 mL IVPB 1,250 mg 166.7 mL/hr   02/01/13 1358 Given   imipenem-cilastatin (PRIMAXIN) 500 mg in sodium chloride 0.9 % 100 mL IVPB 500 mg 200 mL/hr   02/01/13 2030 Given   imipenem-cilastatin (PRIMAXIN) 500 mg in sodium chloride 0.9 % 100 mL IVPB 500 mg 200 mL/hr   02/02/13 0030 Given   vancomycin (VANCOCIN) 1,250 mg in sodium chloride 0.9 % 250 mL IVPB 1,250 mg 166.7 mL/hr   02/02/13 0230 Given   imipenem-cilastatin (PRIMAXIN) 500 mg in sodium chloride 0.9 % 100 mL IVPB 500 mg 200 mL/hr   02/02/13 0809 Given   imipenem-cilastatin (PRIMAXIN) 500 mg in sodium chloride 0.9 % 100 mL IVPB 500 mg 200 mL/hr   02/02/13 1329 Given    imipenem-cilastatin (PRIMAXIN) 500 mg in sodium chloride 0.9 % 100 mL IVPB 500 mg 200 mL/hr      Subjective: Feels well, no complaints  Objective: Temp:  [98.4 F (36.9 C)-99.9 F (37.7 C)] 98.4 F (36.9 C) (10/20 1158) Pulse Rate:  [94-115] 104 (10/20 1200) Resp:  [20-35] 33 (10/20 1200) BP: (100-126)/(66-84) 106/71 mmHg (10/20 1200) SpO2:  [92 %-100 %] 92 % (10/20 1200) Weight:  [225 lb 8.5 oz (102.3 kg)] 225 lb 8.5 oz (102.3 kg) (10/20 0600)  General: Alert, nad Skin: no rashes Lungs: CTA  Cor: rrr Abdomen: soft, nt, nd Ext: no edema  Lab Results Lab Results  Component Value Date   WBC 16.9* 02/02/2013   HGB 9.9* 02/02/2013   HCT 29.5* 02/02/2013   MCV 89.1 02/02/2013   PLT 299 02/02/2013    Lab Results  Component Value Date   CREATININE 0.88 02/02/2013   BUN 17 02/02/2013   NA 135 02/02/2013   K 3.2* 02/02/2013   CL 94* 02/02/2013   CO2 29 02/02/2013    Lab Results  Component Value Date   ALT 24 01/22/2013   AST 34 01/22/2013   ALKPHOS 156* 01/22/2013   BILITOT 0.8 01/22/2013      Microbiology: Recent Results (from the past 240  hour(s))  BODY FLUID CULTURE     Status: None   Collection Time    01/24/13 10:20 AM      Result Value Range Status   Specimen Description PERICARDIAL FLUID   Final   Special Requests NONE   Final   Gram Stain     Final   Value: ABUNDANT WBC PRESENT, PREDOMINANTLY PMN     NO ORGANISMS SEEN     Performed at [facility]   Culture     Final   Value: NO GROWTH 3 DAYS     Performed at [facility]   Report Status 01/28/2013 FINAL   Final  URINE CULTURE     Status: None   Collection Time    01/28/13  8:48 AM      Result Value Range Status   Specimen Description URINE, CATHETERIZED   Final   Special Requests none Normal   Final   Culture  Setup Time     Final   Value: 01/28/2013 16:01     Performed at [facility] Count     Final   Value: NO GROWTH     Performed at [facility]    Culture     Final   Value: NO GROWTH     Performed at [facility]   Report Status 01/29/2013 FINAL   Final  CULTURE, RESPIRATORY (NON-EXPECTORATED)     Status: None   Collection Time    01/28/13 10:24 AM      Result Value Range Status   Specimen Description TRACHEAL ASPIRATE   Final   Special Requests NONE   Final   Gram Stain     Final   Value: FEW WBC PRESENT,BOTH PMN AND MONONUCLEAR     NO SQUAMOUS EPITHELIAL CELLS SEEN     RARE GRAM POSITIVE COCCI     IN PAIRS     Performed at [facility]   Culture     Final   Value: Non-Pathogenic Oropharyngeal-type Flora Isolated.     Performed at [facility]   Report Status 01/30/2013 FINAL   Final  CULTURE, BLOOD (ROUTINE X 2)     Status: None   Collection Time    01/28/13 11:30 AM      Result Value Range Status   Specimen Description BLOOD LEFT ARM   Final   Special Requests BOTTLES DRAWN AEROBIC AND ANAEROBIC 10CC   Final   Culture  Setup Time     Final   Value: 01/28/2013 14:06     Performed at [facility]   Culture     Final   Value:        BLOOD CULTURE RECEIVED NO GROWTH TO DATE CULTURE WILL BE HELD FOR 5 DAYS BEFORE ISSUING A FINAL NEGATIVE REPORT     Performed at [facility]   Report Status PENDING   Incomplete  CULTURE, BLOOD (ROUTINE X 2)     Status: None   Collection Time    01/28/13 11:30 AM      Result Value Range Status   Specimen Description BLOOD RIGHT FOREARM   Final   Special Requests BOTTLES DRAWN AEROBIC AND ANAEROBIC 10CC   Final   Culture  Setup Time     Final   Value: 01/28/2013 14:06     Performed at [facility]   Culture     Final   Value:  BLOOD CULTURE RECEIVED NO GROWTH TO DATE CULTURE WILL BE HELD FOR 5 DAYS BEFORE ISSUING A FINAL NEGATIVE REPORT     Performed at [facility]   Report Status PENDING   Incomplete  SURGICAL PCR SCREEN     Status: Abnormal   Collection Time    01/28/13  1:19 PM      Result Value Range Status    MRSA, PCR NEGATIVE  NEGATIVE Final   Staphylococcus aureus POSITIVE (*) NEGATIVE Final   Comment:            The Xpert SA Assay (FDA     approved for NASAL specimens     in patients over 37 years of age),     is one component of     a comprehensive surveillance     program.  Test performance has     been validated by [employer] for patients greater     than or equal to 27 year old.     It is not intended     to diagnose infection nor to     guide or monitor treatment.  AFB CULTURE WITH SMEAR     Status: None   Collection Time    01/28/13  3:36 PM      Result Value Range Status   Specimen Description WOUND PERICARDIAL   Final   Special Requests PATIENT ON FOLLOWING ZINACEF PERICARDIAL CLOT   Final   ACID FAST SMEAR     Final   Value: NO ACID FAST BACILLI SEEN     Performed at [facility]   Culture     Final   Value: CULTURE WILL BE EXAMINED FOR 6 WEEKS BEFORE ISSUING A FINAL REPORT     Performed at [facility]   Report Status PENDING   Incomplete  WOUND CULTURE     Status: None   Collection Time    01/28/13  3:36 PM      Result Value Range Status   Specimen Description WOUND PERICARDIAL   Final   Special Requests PATIENT ON FOLLOWING ZINACEF PERICARDIAL CLOT   Final   Gram Stain     Final   Value: RARE WBC PRESENT,BOTH PMN AND MONONUCLEAR     NO SQUAMOUS EPITHELIAL CELLS SEEN     NO ORGANISMS SEEN     Performed at [facility]   Culture     Final   Value: NO GROWTH 2 DAYS     Performed at [facility]   Report Status 01/30/2013 FINAL   Final  FUNGUS CULTURE W SMEAR     Status: None   Collection Time    01/28/13  3:36 PM      Result Value Range Status   Specimen Description WOUND PERICARDIAL   Final   Special Requests PATIENT ON FOLLOWING ZINACEF PERICARDIAL CLOT   Final   Fungal Smear     Final   Value: NO YEAST OR FUNGAL ELEMENTS SEEN     Performed at [facility]   Culture     Final   Value: CULTURE IN PROGRESS FOR  FOUR WEEKS     Performed at [facility]   Report Status PENDING   Incomplete  AFB CULTURE WITH SMEAR     Status: None   Collection Time    01/28/13  3:36 PM      Result Value Range Status   Specimen Description WOUND PERICARDIAL   Final  Special Requests PATIENT ON FOLLOWING ZINACEF PERICARDIAL CLOT   Final   ACID FAST SMEAR     Final   Value: NO ACID FAST BACILLI SEEN     Performed at [facility]   Culture     Final   Value: CULTURE WILL BE EXAMINED FOR 6 WEEKS BEFORE ISSUING A FINAL REPORT     Performed at [facility]   Report Status PENDING   Incomplete  WOUND CULTURE     Status: None   Collection Time    01/28/13  3:36 PM      Result Value Range Status   Specimen Description WOUND PERICARDIAL   Final   Special Requests PATIENT ON FOLLOWING ZINACEF PERICARDIAL CLOT   Final   Gram Stain     Final   Value: MODERATE WBC PRESENT,BOTH PMN AND MONONUCLEAR     NO SQUAMOUS EPITHELIAL CELLS SEEN     NO ORGANISMS SEEN     Performed at [facility]   Culture     Final   Value: NO GROWTH 2 DAYS     Performed at [facility]   Report Status 01/30/2013 FINAL   Final  FUNGUS CULTURE W SMEAR     Status: None   Collection Time    01/28/13  3:36 PM      Result Value Range Status   Specimen Description WOUND PERICARDIAL   Final   Special Requests PATIENT ON FOLLOWING ZINACEF PERICARDIAL CLOT   Final   Fungal Smear     Final   Value: NO YEAST OR FUNGAL ELEMENTS SEEN     Performed at [facility]   Culture     Final   Value: CULTURE IN PROGRESS FOR FOUR WEEKS     Performed at [facility]   Report Status PENDING   Incomplete  ANAEROBIC CULTURE     Status: None   Collection Time    01/28/13  3:36 PM      Result Value Range Status   Specimen Description WOUND PERICARDIAL   Final   Special Requests PATIENT ON FOLLOWING ZINACEF PERICARDIAL CLOT   Final   Gram Stain     Final   Value: MODERATE WBC PRESENT,BOTH PMN AND MONONUCLEAR      NO SQUAMOUS EPITHELIAL CELLS SEEN     NO ORGANISMS SEEN     Performed at [facility]   Culture     Final   Value: NO ANAEROBES ISOLATED; CULTURE IN PROGRESS FOR 5 DAYS     Performed at [facility]   Report Status PENDING   Incomplete  ANAEROBIC CULTURE     Status: None   Collection Time    01/28/13  3:36 PM      Result Value Range Status   Specimen Description WOUND PERICARDIAL   Final   Special Requests PATIENT ON FOLLOWING ZINACEF PERICARDIAL CLOT   Final   Gram Stain     Final   Value: RARE WBC PRESENT,BOTH PMN AND MONONUCLEAR     NO SQUAMOUS EPITHELIAL CELLS SEEN     NO ORGANISMS SEEN     Performed at [facility]   Culture     Final   Value: NO ANAEROBES ISOLATED; CULTURE IN PROGRESS FOR 5 DAYS     Performed at [facility]   Report Status PENDING   Incomplete  AFB CULTURE WITH SMEAR     Status: None   Collection Time  01/28/13  3:42 PM      Result Value Range Status   Specimen Description TISSUE PERICARDIAL   Final   Special Requests NO 2 PT ON ZINACEF   Final   ACID FAST SMEAR     Final   Value: NO ACID FAST BACILLI SEEN     Performed at [facility]   Culture     Final   Value: CULTURE WILL BE EXAMINED FOR 6 WEEKS BEFORE ISSUING A FINAL REPORT     Performed at [facility]   Report Status PENDING   Incomplete  ANAEROBIC CULTURE     Status: None   Collection Time    01/28/13  3:42 PM      Result Value Range Status   Specimen Description TISSUE PERICARDIAL   Final   Special Requests NO 2 PT ON ZINACEF   Final   Gram Stain     Final   Value: FEW WBC PRESENT,BOTH PMN AND MONONUCLEAR     NO ORGANISMS SEEN     Performed at [facility]   Culture     Final   Value: NO ANAEROBES ISOLATED; CULTURE IN PROGRESS FOR 5 DAYS     Performed at [facility]   Report Status PENDING   Incomplete  FUNGUS CULTURE W SMEAR     Status: None   Collection Time    01/28/13  3:42 PM      Result Value Range Status    Specimen Description TISSUE PERICARDIAL   Final   Special Requests NO 2 PT ON ZINACEF   Final   Fungal Smear     Final   Value: NO YEAST OR FUNGAL ELEMENTS SEEN     Performed at [facility]   Culture     Final   Value: CULTURE IN PROGRESS FOR FOUR WEEKS     Performed at [facility]   Report Status PENDING   Incomplete  TISSUE CULTURE     Status: None   Collection Time    01/28/13  3:42 PM      Result Value Range Status   Specimen Description TISSUE PERICARDIAL   Final   Special Requests NO 2 PT ON ZINACEF   Final   Gram Stain     Final   Value: FEW WBC PRESENT,BOTH PMN AND MONONUCLEAR     NO ORGANISMS SEEN     Performed at [facility]   Culture     Final   Value: NO GROWTH 3 DAYS     Performed at [facility]   Report Status 01/31/2013 FINAL   Final  AFB CULTURE WITH SMEAR     Status: None   Collection Time    01/28/13  3:48 PM      Result Value Range Status   Specimen Description TISSUE   Final   Special Requests NO 3 EPICARDIUM PT ON ZINACEF   Final   ACID FAST SMEAR     Final   Value: NO ACID FAST BACILLI SEEN     Performed at [facility]   Culture     Final   Value: CULTURE WILL BE EXAMINED FOR 6 WEEKS BEFORE ISSUING A FINAL REPORT     Performed at [facility]   Report Status PENDING   Incomplete  ANAEROBIC CULTURE     Status: None   Collection Time    01/28/13  3:48 PM      Result Value Range  Status   Specimen Description TISSUE   Final   Special Requests NO 3 EPICARDIUM PT ON ZINACEF   Final   Gram Stain     Final   Value: FEW WBC PRESENT, PREDOMINANTLY PMN     NO ORGANISMS SEEN     Performed at [facility]   Culture     Final   Value: NO ANAEROBES ISOLATED; CULTURE IN PROGRESS FOR 5 DAYS     Performed at [facility]   Report Status PENDING   Incomplete  FUNGUS CULTURE W SMEAR     Status: None   Collection Time    01/28/13  3:48 PM      Result Value Range Status   Specimen Description  TISSUE   Final   Special Requests NO 3 EPICARDIUM PT ON ZINACEF   Final   Fungal Smear     Final   Value: NO YEAST OR FUNGAL ELEMENTS SEEN     Performed at [facility]   Culture     Final   Value: CULTURE IN PROGRESS FOR FOUR WEEKS     Performed at [facility]   Report Status PENDING   Incomplete  TISSUE CULTURE     Status: None   Collection Time    01/28/13  3:48 PM      Result Value Range Status   Specimen Description TISSUE   Final   Special Requests NO 3 EPICARDIUM PT ON ZINACEF   Final   Gram Stain     Final   Value: FEW WBC PRESENT, PREDOMINANTLY PMN     NO ORGANISMS SEEN     Performed at [facility]   Culture     Final   Value: NO GROWTH 3 DAYS     Performed at [facility]   Report Status 01/31/2013 FINAL   Final  WOUND CULTURE     Status: None   Collection Time    01/28/13  4:05 PM      Result Value Range Status   Specimen Description WOUND   Final   Special Requests PATIENT ON FOLLOWING ZINACEF LEFT VENTRICULAR CLOT   Final   Gram Stain     Final   Value: MODERATE WBC PRESENT, PREDOMINANTLY PMN     NO SQUAMOUS EPITHELIAL CELLS SEEN     NO ORGANISMS SEEN     Performed at [facility]   Culture     Final   Value: NO GROWTH 2 DAYS     Performed at [facility]   Report Status 01/30/2013 FINAL   Final  AFB CULTURE WITH SMEAR     Status: None   Collection Time    01/28/13  4:05 PM      Result Value Range Status   Specimen Description WOUND   Final   Special Requests PATIENT ON FOLLOWING ZINACEF LEFT VENTRICULAR CLOT   Final   ACID FAST SMEAR     Final   Value: NO ACID FAST BACILLI SEEN     Performed at [facility]   Culture     Final   Value: CULTURE WILL BE EXAMINED FOR 6 WEEKS BEFORE ISSUING A FINAL REPORT     Performed at [facility]   Report Status PENDING   Incomplete  FUNGUS CULTURE W SMEAR     Status: None   Collection Time    01/28/13  4:05 PM      Result Value Range Status  Specimen Description WOUND   Final   Special Requests     Final   Value: PATIENT ON FOLLOWING ZINACEF LEFRT VENTRICULAR CLOT   Fungal Smear     Final   Value: NO YEAST OR FUNGAL ELEMENTS SEEN     Performed at [facility]   Culture     Final   Value: CULTURE IN PROGRESS FOR FOUR WEEKS     Performed at [facility]   Report Status PENDING   Incomplete    Studies/Results: Ct Angio Head W/cm &/or Wo Cm  02/02/2013   CLINICAL DATA:  Left arm weakness and numbness. Left visual field defects.  EXAM: CT ANGIOGRAPHY HEAD AND NECK  TECHNIQUE: Multidetector CT imaging of the head and neck was performed using the standard protocol during bolus administration of intravenous contrast. Multiplanar CT image reconstructions including MIPs were obtained to evaluate the vascular anatomy. Carotid stenosis measurements (when applicable) are obtained utilizing NASCET criteria, using the distal internal carotid diameter as the denominator.  CONTRAST:  80mL OMNIPAQUE IOHEXOL 350 MG/ML SOLN  COMPARISON:  CT head without contrast 01/28/2013.  FINDINGS: CTA HEAD FINDINGS  Multiple new nonhemorrhagic infarcts involve the left superior cerebellum, the posterior left parietal, the lateral left occipital cortex, and the posterior right frontal lobe. An additional area of acute nonhemorrhagic infarction is present in the anterior left frontal lobe on image 19 of series 2. A sub cm white matter infarct is present in the right coronal radiata on image 19 as well.  The postcontrast images and exhibits same areas of the infarct. No pathologic enhancement is present.  Ventricles are of normal size. No significant extra-axial fluid collection is present. The paranasal sinuses and mastoid air cells are clear. The osseous skull is intact.  The A1 and M1 segments are normal. The left anterior cerebral artery is fenestrated near the anti communicating artery. The aging communicating artery is patent. The ACA and MCA branch  vessels are within normal limits.  The right vertebral artery is slightly dominant to the left. The PICA origins are visualized and within normal limits bilaterally. The basilar artery is normal. The right posterior cerebral artery is of fetal type. The left posterior cerebral artery originates from the basilar tip and the left posterior communicating artery.  The source images demonstrate no additional infarcts. More remote lacunar infarcts are present within the left cerebellum.  Review of the MIP images confirms the above findings.  CTA NECK FINDINGS  A standard 3 vessel arch configuration is present. Both vertebral arteries originate from the subclavian arteries. The right vertebral artery is the dominant vessel. There is no significant stenoses or significant caliber change of the vertebral arteries within the cervical vertebral arteries.  The right common carotid artery is within normal limits. The bifurcation is unremarkable. Cervical right internal carotid artery is or bulb.  The left common carotid artery is within normal limits. The bifurcation is unremarkable. The cervical ICA is normal.  The soft tissues of the neck are unremarkable. The thyroid is within normal limits.  The patient is status post recent median sternotomy. Bilateral pleural effusions and associated atelectasis remaining. There is no significant cervical adenopathy.  Review of the MIP images confirms the above findings.  IMPRESSION: CTA HEAD IMPRESSION  1. New acute/ subacute nonhemorrhagic infarcts involving the left superior cerebellum, the posterior and medial left parietal lobe and lateral left occipital lobe, the higher left frontal lobe, the posterior right frontal lobe and corona radiata. 2. No significant focal  stenosis, aneurysm, or branch vessel occlusion. 3. More remote lacunar infarcts involve the left cerebellum.  CTA NECK IMPRESSION  1. No significant cervical stenoses. Normal appearance of the cervical vasculature. 2.  Bilateral pleural effusions and associated atelectasis. 3. Status post recent median sternotomy.   Electronically Signed   By: Gennette Pac M.D.   On: 02/02/2013 13:54   Ct Angio Neck W/cm &/or Wo/cm  02/02/2013   CLINICAL DATA:  Left arm weakness and numbness. Left visual field defects.  EXAM: CT ANGIOGRAPHY HEAD AND NECK  TECHNIQUE: Multidetector CT imaging of the head and neck was performed using the standard protocol during bolus administration of intravenous contrast. Multiplanar CT image reconstructions including MIPs were obtained to evaluate the vascular anatomy. Carotid stenosis measurements (when applicable) are obtained utilizing NASCET criteria, using the distal internal carotid diameter as the denominator.  CONTRAST:  80mL OMNIPAQUE IOHEXOL 350 MG/ML SOLN  COMPARISON:  CT head without contrast 01/28/2013.  FINDINGS: CTA HEAD FINDINGS  Multiple new nonhemorrhagic infarcts involve the left superior cerebellum, the posterior left parietal, the lateral left occipital cortex, and the posterior right frontal lobe. An additional area of acute nonhemorrhagic infarction is present in the anterior left frontal lobe on image 19 of series 2. A sub cm white matter infarct is present in the right coronal radiata on image 19 as well.  The postcontrast images and exhibits same areas of the infarct. No pathologic enhancement is present.  Ventricles are of normal size. No significant extra-axial fluid collection is present. The paranasal sinuses and mastoid air cells are clear. The osseous skull is intact.  The A1 and M1 segments are normal. The left anterior cerebral artery is fenestrated near the anti communicating artery. The aging communicating artery is patent. The ACA and MCA branch vessels are within normal limits.  The right vertebral artery is slightly dominant to the left. The PICA origins are visualized and within normal limits bilaterally. The basilar artery is normal. The right posterior cerebral  artery is of fetal type. The left posterior cerebral artery originates from the basilar tip and the left posterior communicating artery.  The source images demonstrate no additional infarcts. More remote lacunar infarcts are present within the left cerebellum.  Review of the MIP images confirms the above findings.  CTA NECK FINDINGS  A standard 3 vessel arch configuration is present. Both vertebral arteries originate from the subclavian arteries. The right vertebral artery is the dominant vessel. There is no significant stenoses or significant caliber change of the vertebral arteries within the cervical vertebral arteries.  The right common carotid artery is within normal limits. The bifurcation is unremarkable. Cervical right internal carotid artery is or bulb.  The left common carotid artery is within normal limits. The bifurcation is unremarkable. The cervical ICA is normal.  The soft tissues of the neck are unremarkable. The thyroid is within normal limits.  The patient is status post recent median sternotomy. Bilateral pleural effusions and associated atelectasis remaining. There is no significant cervical adenopathy.  Review of the MIP images confirms the above findings.  IMPRESSION: CTA HEAD IMPRESSION  1. New acute/ subacute nonhemorrhagic infarcts involving the left superior cerebellum, the posterior and medial left parietal lobe and lateral left occipital lobe, the higher left frontal lobe, the posterior right frontal lobe and corona radiata. 2. No significant focal stenosis, aneurysm, or branch vessel occlusion. 3. More remote lacunar infarcts involve the left cerebellum.  CTA NECK IMPRESSION  1. No significant cervical stenoses. Normal appearance of  the cervical vasculature. 2. Bilateral pleural effusions and associated atelectasis. 3. Status post recent median sternotomy.   Electronically Signed   By: Gennette Pac M.D.   On: 02/02/2013 13:54    Assessment/Plan: 1) pericarditis - negative cultures  to date.  Intraoperatively did note necrosis and foul smell (ischemia vs infection), concerning for infection.  A few WBCs on gram stain.   -no fungal organism identified, will d/c fluconazole -d/c vanocmyin -continue with imipenem for 14 days total from surgery through 10/29 for infectious pericarditis  COMER, Molly Maduro, MD [facility] [email or web address] [id number] pager   [phone] cell 02/02/2013, 2:17 PM

## 2013-02-02 NOTE — Progress Notes (Signed)
EVENING ROUNDS NOTE :     [address]       [place] [address]             [phone]                 5 Days Post-Op Procedure(s) (LRB): CORONARY ARTERY BYPASS GRAFTING (CABG) (N/A) INTRAOPERATIVE TRANSESOPHAGEAL ECHOCARDIOGRAM (N/A) LEFT VENTRICULAR ANEURYSM REPAIR (N/A)  Total Length of Stay:  LOS: 12 days  BP 110/71  Pulse 105  Temp(Src) 98.8 F (37.1 C) (Oral)  Resp 13  Ht 6\' 1"  (1.854 m)  Wt 225 lb 8.5 oz (102.3 kg)  BMI 29.76 kg/m2  SpO2 92%  .Intake/Output     10/19 0701 - 10/20 0700 10/20 0701 - 10/21 0700   P.O. 720 120   I.V. (mL/kg) 848.9 (8.3) 240 (2.3)   IV Piggyback 1250 350   Total Intake(mL/kg) 2818.9 (27.6) 710 (6.9)   Urine (mL/kg/hr) 5725 (2.3) 1225 (1.2)   Stool 4 (0) 1 (0)   Total Output 5729 1226   Net -2910.1 -516          . sodium chloride 20 mL/hr at 01/30/13 2000  . sodium chloride 20 mL/hr at 01/28/13 2100  . sodium chloride    . dexmedetomidine Stopped (01/30/13 0930)  . DOPamine 3 mcg/kg/min (02/01/13 0830)  . lactated ringers 20 mL/hr at 01/28/13 2100     Lab Results  Component Value Date   WBC 16.9* 02/02/2013   HGB 8.8* 02/02/2013   HCT 26.0* 02/02/2013   PLT 299 02/02/2013   GLUCOSE 98 02/02/2013   CHOL 164 01/06/2013   TRIG 109 01/06/2013   HDL 44 01/06/2013   LDLCALC 98 01/06/2013   ALT 24 01/22/2013   AST 34 01/22/2013   NA 135 02/02/2013   K 3.3* 02/02/2013   CL 94* 02/02/2013   CREATININE 0.88 02/02/2013   BUN 17 02/02/2013   CO2 29 02/02/2013   TSH 0.604 01/05/2013   INR 1.79* 01/28/2013   HGBA1C 5.7* 01/05/2013   Ct Angio Head W/cm &/or Wo Cm  02/02/2013   CLINICAL DATA:  Left arm weakness and numbness. Left visual field defects.  EXAM: CT ANGIOGRAPHY HEAD AND NECK  TECHNIQUE: Multidetector CT imaging of the head and neck was performed using the standard protocol during bolus administration of intravenous contrast. Multiplanar CT image reconstructions including MIPs were obtained to evaluate the  vascular anatomy. Carotid stenosis measurements (when applicable) are obtained utilizing NASCET criteria, using the distal internal carotid diameter as the denominator.  CONTRAST:  80mL OMNIPAQUE IOHEXOL 350 MG/ML SOLN  COMPARISON:  CT head without contrast 01/28/2013.  FINDINGS: CTA HEAD FINDINGS  Multiple new nonhemorrhagic infarcts involve the left superior cerebellum, the posterior left parietal, the lateral left occipital cortex, and the posterior right frontal lobe. An additional area of acute nonhemorrhagic infarction is present in the anterior left frontal lobe on image 19 of series 2. A sub cm white matter infarct is present in the right coronal radiata on image 19 as well.  The postcontrast images and exhibits same areas of the infarct. No pathologic enhancement is present.  Ventricles are of normal size. No significant extra-axial fluid collection is present. The paranasal sinuses and mastoid air cells are clear. The osseous skull is intact.  The A1 and M1 segments are normal. The left anterior cerebral artery is fenestrated near the anti communicating artery. The aging communicating artery is patent. The ACA and MCA branch vessels are within normal  limits.  The right vertebral artery is slightly dominant to the left. The PICA origins are visualized and within normal limits bilaterally. The basilar artery is normal. The right posterior cerebral artery is of fetal type. The left posterior cerebral artery originates from the basilar tip and the left posterior communicating artery.  The source images demonstrate no additional infarcts. More remote lacunar infarcts are present within the left cerebellum.  Review of the MIP images confirms the above findings.  CTA NECK FINDINGS  A standard 3 vessel arch configuration is present. Both vertebral arteries originate from the subclavian arteries. The right vertebral artery is the dominant vessel. There is no significant stenoses or significant caliber change of the  vertebral arteries within the cervical vertebral arteries.  The right common carotid artery is within normal limits. The bifurcation is unremarkable. Cervical right internal carotid artery is or bulb.  The left common carotid artery is within normal limits. The bifurcation is unremarkable. The cervical ICA is normal.  The soft tissues of the neck are unremarkable. The thyroid is within normal limits.  The patient is status post recent median sternotomy. Bilateral pleural effusions and associated atelectasis remaining. There is no significant cervical adenopathy.  Review of the MIP images confirms the above findings.  IMPRESSION: CTA HEAD IMPRESSION  1. New acute/ subacute nonhemorrhagic infarcts involving the left superior cerebellum, the posterior and medial left parietal lobe and lateral left occipital lobe, the higher left frontal lobe, the posterior right frontal lobe and corona radiata. 2. No significant focal stenosis, aneurysm, or branch vessel occlusion. 3. More remote lacunar infarcts involve the left cerebellum.  CTA NECK IMPRESSION  1. No significant cervical stenoses. Normal appearance of the cervical vasculature. 2. Bilateral pleural effusions and associated atelectasis. 3. Status post recent median sternotomy.   Electronically Signed   By: Gennette Pac M.D.   On: 02/02/2013 13:54      Left arm weak but can lift slighly No gross field cut  Delight Ovens MD  Beeper [phone] Office [phone] 02/02/2013 5:02 PM

## 2013-02-02 NOTE — Progress Notes (Signed)
Stroke Team Progress Note  HISTORY [name] is an 52 y.o. male with a history of CAD s/p NSTEMI on 9/23 and admission 10/8 with recurrent chest pain secondary to acute pericarditis and cardiac tamponade (Dressler syndrome). Patient had a drain placed that was removed on the 14th due to decreased drainage. Patient has been doing well other than periods of hypotension until this morning after using the bathroom. At that time complained of being unable to use his left hand. Patient went to head CT and on return complained of difficulty seeing to the left as well.   Date last known well: Date: 01/28/2013  Time last known well: Time: 05:45  tPA Given: No: Patient with recent pericarditis and drainage   01/28/2013 Nursing reported respiratory distress, rigors, patient complained of not being able to move left hand with numbness to mid forearm, ataxia of left arm. CT ordered which showed the old left cerebellar infarct.  01/29/2013 Pericardial tamponade with clotted blood  Contained ruptured inferoposterior pseudoaneurysm  Extensive necrotic myocardium  3 x 5 cm defect, repaired    SUBJECTIVE Extubated. Sitting up in chair following commands. Still unable to lift left arm/hand.     OBJECTIVE Most recent Vital Signs: Filed Vitals:   02/02/13 0400 02/02/13 0500 02/02/13 0600 02/02/13 0752  BP: 120/73 100/81 110/84   Pulse: 108 109 110   Temp: 98.7 F (37.1 C)   98.6 F (37 C)  TempSrc: Oral   Oral  Resp: 28 24 28    Height:      Weight:   102.3 kg (225 lb 8.5 oz)   SpO2: 99% 98% 96%    CBG (last 3)   Recent Labs  01/31/13 2342 02/01/13 0356 02/01/13 0716  GLUCAP 73 85 91    IV Fluid Intake:   . sodium chloride 20 mL/hr at 01/30/13 2000  . sodium chloride 20 mL/hr at 01/28/13 2100  . sodium chloride    . dexmedetomidine Stopped (01/30/13 0930)  . DOPamine 3 mcg/kg/min (02/01/13 0830)  . lactated ringers 20 mL/hr at 01/28/13 2100    MEDICATIONS  . acetaminophen   1,000 mg Oral Q6H   Or  . acetaminophen (TYLENOL) oral liquid 160 mg/5 mL  1,000 mg Per Tube Q6H  . aspirin EC  325 mg Oral Daily   Or  . aspirin  324 mg Per Tube Daily  . bisacodyl  10 mg Oral Daily   Or  . bisacodyl  10 mg Rectal Daily  . docusate sodium  200 mg Oral Daily  . enoxaparin (LOVENOX) injection  40 mg Subcutaneous Q24H  . fluconazole (DIFLUCAN) IV  400 mg Intravenous Q24H  . furosemide  40 mg Intravenous BID  . imipenem-cilastatin  500 mg Intravenous Q6H  . magnesium sulfate  4 g Intravenous Once  . metoprolol tartrate  25 mg Oral TID  . pantoprazole  40 mg Oral Daily  . potassium chloride  10 mEq Intravenous Q1 Hr x 2  . potassium chloride      . sodium chloride  3 mL Intravenous Q12H  . vancomycin  1,250 mg Intravenous Q12H   PRN:  metoprolol, morphine injection, ondansetron (ZOFRAN) IV, oxyCODONE, potassium chloride, sodium chloride  Diet:  Dysphagia 1  Activity:  Bedrest DVT Prophylaxis:  lovenox  CLINICALLY SIGNIFICANT STUDIES Basic Metabolic Panel:   Recent Labs Lab 01/29/13 0201 01/29/13 1700  02/01/13 0420 02/01/13 1600 02/02/13 0327  NA 142  --   < > 138 138 135  K 4.8  --   < >  3.4* 3.6 3.2*  CL 105  --   < > 98  --  94*  CO2 26  --   < > 29  --  29  GLUCOSE 145*  --   < > 85 95 114*  BUN 28*  --   < > 21  --  17  CREATININE 1.83* 1.87*  < > 0.92  --  0.88  CALCIUM 7.0*  --   < > 8.2*  --  8.5  MG 1.9 2.0  --   --   --   --   < > = values in this interval not displayed. Liver Function Tests:  No results found for this basename: AST, ALT, ALKPHOS, BILITOT, PROT, ALBUMIN,  in the last 168 hours CBC:   Recent Labs Lab 02/01/13 0420 02/01/13 1600 02/02/13 0327  WBC 17.1*  --  16.9*  HGB 9.7* 10.2* 9.9*  HCT 28.5* 30.0* 29.5*  MCV 89.3  --  89.1  PLT 264  --  299   Coagulation:   Recent Labs Lab 01/28/13 1059 01/28/13 1858 01/28/13 2011 01/28/13 2050  LABPROT 16.4* 26.1* 21.4* 20.3*  INR 1.36 2.49* 1.92* 1.79*   Cardiac  Enzymes:   Recent Labs Lab 01/29/13 0715  CKTOTAL 436*  CKMB 28.0*   Urinalysis:   Recent Labs Lab 01/28/13 0847  COLORURINE ORANGE*  LABSPEC 1.030  PHURINE 5.0  GLUCOSEU NEGATIVE  HGBUR NEGATIVE  BILIRUBINUR MODERATE*  KETONESUR 15*  PROTEINUR 30*  UROBILINOGEN 4.0*  NITRITE POSITIVE*  LEUKOCYTESUR TRACE*   Lipid Panel    Component Value Date/Time   CHOL 164 01/06/2013 0511   TRIG 109 01/06/2013 0511   HDL 44 01/06/2013 0511   CHOLHDL 3.7 01/06/2013 0511   VLDL 22 01/06/2013 0511   LDLCALC 98 01/06/2013 0511   HgbA1C  Lab Results  Component Value Date   HGBA1C 5.7* 01/05/2013    Urine Drug Screen:     Component Value Date/Time   LABOPIA NONE DETECTED 01/05/2013 2053   COCAINSCRNUR NONE DETECTED 01/05/2013 2053   LABBENZ NONE DETECTED 01/05/2013 2053   AMPHETMU NONE DETECTED 01/05/2013 2053   THCU NONE DETECTED 01/05/2013 2053   LABBARB NONE DETECTED 01/05/2013 2053    Alcohol Level: No results found for this basename: ETH,  in the last 168 hours  Ct Head Wo Contrast 01/28/2013   No acute abnormality. Old left cerebellar infarct. Chronic small vessel ischemic changes.    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 The effusion is located anteriorly around the RV free wall. Maximum diameter in diastole is 10 mm. There is no collapse of the right ventricle in diastole. IVC is dilated with less than 50% respiratory variations. There are no signs of tamponade. There is akinesis of the basal and mid inferior and inferoseptal walls and apical inferior wall. Overall left ventricular ejection fraction, 40-45 % 01/24/2013 A moderate, free-flowing pericardial effusionwas identified circumferential to  the heart, predominantly anterior.The fluid contained focal strands. Doppler: The possibility of hemodynamic compromise cannot be excluded on the basis of available images. Significant mitral inflow variation is noted. The IVC does not collapse and the patient is tachycardic. 10/10/2014Left ventricle: Akinesis inferolateral wall. Dyskinesis base inferior wall. The  cavity size was normal. Wall thickness was increased in a pattern of mild LVH. The estimated ejection fraction was 35%. - Right ventricle: The cavity size was moderately dilated. Systolic function was moderately reduced. - Right atrium: The atrium was mildly dilated. - Pericardium, extracardiac: Pericardial effusion is only moderate, but it is larger than yesterday. There is some RV collapse. I am concerned about possible tamponade heodynamics.  CXR    Therapy Recommendations   Filed Vitals:   02/02/13 0752  BP:   Pulse:   Temp: 98.6 F (37 C)  Resp:    Physical Exam  General: The patient is alert and cooperative at the time of the examination.  Skin: 2+ edema present on all extremities.   Neurologic Exam Awake alert oriented following commands Cranial nerves: Facial symmetry is not present. Slight depression of the left nasolabial fold. The patient has good understanding her speech, slight dysarthria. Extraocular movements are full. Visual fields are full.  Motor: The patient has good strength in the right extremities. On the left, the patient has an almost plegic left arm, 4/5 strength of the left lower extremity. Coordination: The patient has good finger-nose-finger and toe to finger on the right, unable to perform on the left..  Gait and station: The patient could not be ambulated.  Reflexes: Deep tendon reflexes are symmetric, but are depressed   ASSESSMENT [name] is a 52 y.o. male presenting with left arm weakness, numbness and left visual field deficits. CT shows no acute stroke. Symptoms  secondary to unknown source but sepsis suspected.  On aspirin 81 mg orally every day and clopidogrel 75 mg orally every day prior to admission. Currently NO ASPIRIN 325mg  daily due to bleeding risk. Work up underway.   Respiratory distress, extubated  Recurrent tamponade with inferiorposterior pseudoaneurysm, necrosis, s/p OR pod # 5  Sepsis, on IV abx.  LDL 98  HGB A1C   5.7  Former smoker  Coronary artery disease, s/p stent  Hospital day # 12  TREATMENT/PLAN  Patient is not able to lie flat due to surgery and he has pacing wires; therefore cannot have MRI/A at this time. Would like to pursue CT angio head/neck. Renal function has normalized. Will do if agreeable with surgeons only.  Asprin started  CIR recommended.  Risk factor modification  Gwendolyn Lima. Manson Passey, [facility], MBA, MHA [facility] Pager: [phone] 02/02/2013 8:22 AM  I have personally obtained a history, examined the patient, evaluated imaging results, and formulated the assessment and plan of care. I agree with the above. Delia Heady, MD

## 2013-02-02 NOTE — Progress Notes (Signed)
Chaplain visited with pt while he was awake and sitting in bedside recliner. Pt said he has had "a lot of things happen to him" and expressed feeling "shocked" by all of it. He stated he has always been "independent" and "the rock of the family," but is "taking all the help he can get from doctors, staff, family, and friends." Pt affirmed that this loss of control and independence is his primary anxiety. Pt said he draws hope from "the Man upstairs" and his nine-year-old grandchild. He continually stated that he will "take it one day at a time" from here. He expressed interest in a HPOA, with his fiance as healthcare agent. He also asked chaplain for prayer and to visit again. He expressed thanks for visit.   Chaplain provided empathic listening, ministry of presence, emotional/spiritual support and prayer. Chaplain will provide pt with Advanced Directive information and follow up at later time.   Maurene Capes, [place] [phone]

## 2013-02-02 NOTE — Progress Notes (Signed)
Following at a distance. Making daily progress. Rhythm remains sinus tachycardia. BB has been increased today.

## 2013-02-02 NOTE — Progress Notes (Signed)
Clinical Social Work Department CLINICAL SOCIAL WORK PLACEMENT NOTE 02/02/2013  Patient:  [name], [name]  Account Number:  [id number] Admit date:  01/21/2013  Clinical Social Worker:  Carren Rang  Date/time:  02/02/2013 02:55 PM  Clinical Social Work is seeking post-discharge placement for this patient at the following level of care:   SKILLED NURSING   (*CSW will update this form in Epic as items are completed)   02/02/2013  Patient/family provided with [facility] Department of Clinical Social Work's list of facilities offering this level of care within the geographic area requested by the patient (or if unable, by the patient's family).  02/02/2013  Patient/family informed of their freedom to choose among providers that offer the needed level of care, that participate in Medicare, Medicaid or managed care program needed by the patient, have an available bed and are willing to accept the patient.  02/02/2013  Patient/family informed of [facility]' ownership interest in [facility], as well as of the fact that they are under no obligation to receive care at this facility.  PASARR submitted to EDS on 02/02/2013 PASARR number received from EDS on 02/02/2013  FL2 transmitted to all facilities in geographic area requested by pt/family on  02/02/2013 FL2 transmitted to all facilities within larger geographic area on   Patient informed that his/her managed care company has contracts with or will negotiate with  certain facilities, including the following:     Patient/family informed of bed offers received:   Patient chooses bed at  Physician recommends and patient chooses bed at    Patient to be transferred to  on   Patient to be transferred to facility by   The following physician request were entered in Epic:   Additional Comments:  [name], MSW, [employer] [phone]

## 2013-02-02 NOTE — Progress Notes (Signed)
OT Cancellation Note  Patient Details Name: [name] MRN: [id number] DOB: Aug 30, 1960   Cancelled Treatment:    Reason Eval/Treat Not Completed: Patient at procedure or test/ unavailable - will reattempt  Jeani Hawking M [phone] 02/02/2013, 12:01 PM

## 2013-02-02 NOTE — Progress Notes (Signed)
Rehab Admissions Coordinator Note:  Patient was screened by Trish Mage for appropriateness for an Inpatient Acute Rehab Consult.  Noted PT recommending CIR.  At this time, we are recommending Inpatient Rehab consult.  Trish Mage 02/02/2013, 7:59 AM  I can be reached at [phone].

## 2013-02-02 NOTE — Progress Notes (Signed)
Speech Language Pathology Treatment: Dysphagia  Patient Details Name: [name] MRN: [id number] DOB: Sep 23, 1960 Today's Date: 02/02/2013 Time: 1213-1221 SLP Time Calculation (min): 8 min  Assessment / Plan / Recommendation Clinical Impression  Following up for skilled ST dysphagia treatment after initial swallow evaluation 10/18.  Upper denture plate now at bedside and assisted pt. To donn plate.  Pt. Masticated cracker without difficulty or residue.  No s/s aspiration with straw sips thin.  Recommend diet texture upgrade to regular and continue thin liquids.  No follow up needed.   HPI HPI: 52 year old gentleman comes to the emergency room because of ongoing chest pain. TPMH:  ischemic heart disease, MI.  He was in his usual state of health on Friday, December 19 when he began having substernal chest discomfort. 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  In the emergency room his EKG is nonacute but did show evidence of the old inferoposterior wall myocardial infarction and his troponin level is significantly elevated at 6.78.  10/15 he developed sudden onset of left arm weakness. He then developed a left visual field defect. He then had a sudden hemodynamic collapse with hypotension and tachycardia. He also spiked a temperature to 104.46F. He required intubation 01/28/09/17 and is requiring high levels of PEEP. A repeat echo showed a complex effusion with RV collapse and tamponade.  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  Underwent emergent surgery for the following:  Median sternotomy, extracorporeal circulation, repair of ruptured inferoposterior left ventricular pseudoaneurysm with CorMatrix patch, coronary artery bypass grafting x1.  CT revealed No acute abnormality. Old left cerebellar infarct.   showed Slightly improved pulmonary vascular congestion. Persistent left greater than right  basilar atelectasis. Stable tiny left apical pneumothorax versus apical pleural fluid/ thickening.   Pertinent Vitals none  SLP Plan  All goals met    Recommendations Diet recommendations: Regular;Thin liquid Liquids provided via: Cup;Straw Medication Administration: Whole meds with liquid Supervision: Patient able to self feed;Intermittent supervision to cue for compensatory strategies Compensations: Slow rate;Small sips/bites Postural Changes and/or Swallow Maneuvers: Seated upright 90 degrees              Oral Care Recommendations: Oral care BID Follow up Recommendations: None Plan: All goals met         Royce Macadamia M.Ed [employer] [phone]  02/02/2013

## 2013-02-02 NOTE — Progress Notes (Signed)
Clinical Social Work Department BRIEF PSYCHOSOCIAL ASSESSMENT 02/02/2013  Patient:  [name], [name]     Account Number:  [id number]     Admit date:  01/21/2013  Clinical Social Worker:  Carren Rang  Date/Time:  02/02/2013 02:48 PM  Referred by:  CSW  Date Referred:  02/02/2013 Referred for  SNF Placement   Other Referral:   Interview type:  Patient Other interview type:    PSYCHOSOCIAL DATA Living Status:  FAMILY Admitted from facility:   Level of care:   Primary support name:  [name] Primary support relationship to patient:  FRIEND Degree of support available:   Okay, patient states he lives with step daughter.    CURRENT CONCERNS Current Concerns  Post-Acute Placement   Other Concerns:    SOCIAL WORK ASSESSMENT / PLAN CSW reviewed chart for patient and saw pt's recommendation of CIR. Clinical Social Worker will work up backup SNF placement at d/c.     CSW introduced self and explained reason for visit. CSW explained SNF process to patient.  Patient reported he is agreeable for SNF placement backup if pt does not go to CIR. CSW explained to patient that there are limited choices of facilities in [place] that are able to take a patient without insurance. Patient stated he understood and is willing to do what he has to do. CSW will complete FL2 for MD's signature and will update patient when bed offers arise.    CSW also contacted Financial Counseling to see patient about Medicaid Application. CSW left message with Financial Counseling.   Assessment/plan status:  Psychosocial Support/Ongoing Assessment of Needs Other assessment/ plan:   Information/referral to community resources:   SNF information/Financial Counseling    PATIENT'S/FAMILY'S RESPONSE TO PLAN OF CARE: Patient is agreeable to SNF as a backup plan to CIR.       [name], MSW, [name] [phone]

## 2013-02-02 NOTE — Progress Notes (Signed)
5 Days Post-Op Procedure(s) (LRB): CORONARY ARTERY BYPASS GRAFTING (CABG) (N/A) INTRAOPERATIVE TRANSESOPHAGEAL ECHOCARDIOGRAM (N/A) LEFT VENTRICULAR ANEURYSM REPAIR (N/A) Subjective: C/o diarrhea Denies pain  Objective: Vital signs in last 24 hours: Temp:  [98.3 F (36.8 C)-99.9 F (37.7 C)] 98.7 F (37.1 C) (10/20 0400) Pulse Rate:  [94-116] 110 (10/20 0600) Cardiac Rhythm:  [-] Sinus tachycardia (10/20 0600) Resp:  [20-35] 28 (10/20 0600) BP: (84-126)/(59-84) 110/84 mmHg (10/20 0600) SpO2:  [90 %-100 %] 96 % (10/20 0600) Weight:  [225 lb 8.5 oz (102.3 kg)] 225 lb 8.5 oz (102.3 kg) (10/20 0600)  Hemodynamic parameters for last 24 hours:    Intake/Output from previous day: 10/19 0701 - 10/20 0700 In: 2768.9 [P.O.:720; I.V.:848.9; IV Piggyback:1200] Out: 5729 [Urine:5725; Stool:4] Intake/Output this shift:    General appearance: alert and no distress Neurologic: left sided weakness and UE>LE unchanged Heart: tachy regular Lungs: diminished breath sounds bibasilar Abdomen: normal findings: soft, non-tender Wound: clean and dry  Lab Results:  Recent Labs  02/01/13 0420 02/01/13 1600 02/02/13 0327  WBC 17.1*  --  16.9*  HGB 9.7* 10.2* 9.9*  HCT 28.5* 30.0* 29.5*  PLT 264  --  299   BMET:  Recent Labs  02/01/13 0420 02/01/13 1600 02/02/13 0327  NA 138 138 135  K 3.4* 3.6 3.2*  CL 98  --  94*  CO2 29  --  29  GLUCOSE 85 95 114*  BUN 21  --  17  CREATININE 0.92  --  0.88  CALCIUM 8.2*  --  8.5    PT/INR: No results found for this basename: LABPROT, INR,  in the last 72 hours ABG    Component Value Date/Time   PHART 7.516* 01/30/2013 1103   HCO3 29.3* 01/30/2013 1103   TCO2 30 01/30/2013 1103   ACIDBASEDEF 4.0* 01/28/2013 2112   O2SAT 93.0 01/30/2013 1103   CBG (last 3)   Recent Labs  01/31/13 2342 02/01/13 0356 02/01/13 0716  GLUCAP 73 85 91    Assessment/Plan: S/P Procedure(s) (LRB): CORONARY ARTERY BYPASS GRAFTING (CABG)  (N/A) INTRAOPERATIVE TRANSESOPHAGEAL ECHOCARDIOGRAM (N/A) LEFT VENTRICULAR ANEURYSM REPAIR (N/A) - POD # 5   Cv- still mildly tachycardic- increase lopressor to 25 mg TID  ASA  RESP- continue pulmonary hygiene  RENAL- diuresed well- he's still edematous but below admission weight(different scales)  Dc lasix gtt and give BID  Hypokalemia- supplement  ENDO- CBG well controlled  DVT PROPHYLAXIS- SCD + enoxaparin  NEURO- left hemiparesis- requires 2 person assist  Will need MR- may be able to send down tomorrow  Continue PT/OT/Speech   LOS: 12 days    Cameron Katayama C 02/02/2013

## 2013-02-02 NOTE — Progress Notes (Signed)
Physical Therapy Treatment Patient Details Name: [name] MRN: [id number] DOB: [birth year] Today's Date: 02/02/2013 Time: 4403-4742 PT Time Calculation (min): 35 min  PT Assessment / Plan / Recommendation  History of Present Illness [name] is an 52 y.o. male with a history of CAD s/p NSTEMI on 9/23 and admission 10/8 with recurrent chest pain secondary to acute pericarditis and cardiac tamponade (Dressler syndrome). Patient had a drain placed that was removed on the 14th due to decreased drainage. Patient has been doing well other than periods of hypotension until this morning after using the bathroom. At that time complained of being unable to use his left hand. Patient went to head CT and on return complained of difficulty seeing to the left as well.     PT Comments   Pt progressing well. Pt remains to have L UE flaccidity however demo's improved L LE strength. Pt ambulation limited by dizziness/lightheadedness as expected with first time ambulating. Pt did have bowel incontinence requiring assist for hygiene. RN made aware of loose stool. Pt motivated and an excellent candidate for CIR. Pt reports to have 24/7 assist upon d/c.  Follow Up Recommendations  CIR     Does the patient have the potential to tolerate intense rehabilitation     Barriers to Discharge        Equipment Recommendations       Recommendations for Other Services Rehab consult  Frequency Min 4X/week   Progress towards PT Goals Progress towards PT goals: Progressing toward goals  Plan Current plan remains appropriate    Precautions / Restrictions Precautions Precautions: Fall;Sternal Restrictions Weight Bearing Restrictions: No   Pertinent Vitals/Pain Denies pain    Mobility  Bed Mobility Bed Mobility: Sit to Supine Sit to Supine: 3: Mod assist Details for Bed Mobility Assistance: v/c's to adhere to sternal prec, assist for LEs Transfers Transfers: Sit to Stand;Stand to Sit Sit to Stand:  3: Mod assist Stand to Sit: 4: Min assist Details for Transfer Assistance: used rocking/momentum to achieve anterior weight shift and then push up with LEs. Pt hugged heart pillow with R UE. Pt unable to use L UE functionally at this time Ambulation/Gait Ambulation/Gait Assistance: 3: Mod assist (+1 to manage lines and chair) Ambulation Distance (Feet): 10 Feet Assistive device: 1 person hand held assist Ambulation/Gait Assistance Details: pt limited by dizziness. Pt unable to use RW due to L UE flaccidity. Gait Pattern: Step-to pattern;Decreased stride length;Wide base of support Gait velocity: slow Stairs: No Modified Rankin (Stroke Patients Only) Pre-Morbid Rankin Score: No symptoms Modified Rankin: Severe disability    Exercises General Exercises - Lower Extremity Long Arc Quad: AROM;Both;10 reps;Seated Hip Flexion/Marching: AROM;Both;10 reps;Seated Toe Raises: AROM;Both;10 reps;Seated Heel Raises: AROM;Both;10 reps;Seated   PT Diagnosis:    PT Problem List:   PT Treatment Interventions:     PT Goals (current goals can now be found in the care plan section) Acute Rehab PT Goals Patient Stated Goal: walk  Visit Information  Last PT Received On: 02/02/13 Assistance Needed: +2 History of Present Illness: [name] is an 52 y.o. male with a history of CAD s/p NSTEMI on 9/23 and admission 10/8 with recurrent chest pain secondary to acute pericarditis and cardiac tamponade (Dressler syndrome). Patient had a drain placed that was removed on the 14th due to decreased drainage. Patient has been doing well other than periods of hypotension until this morning after using the bathroom. At that time complained of being unable to use his left  hand. Patient went to head CT and on return complained of difficulty seeing to the left as well.      Subjective Data  Subjective: pt received up in chair Patient Stated Goal: walk   Cognition  Cognition Arousal/Alertness:  Awake/alert Behavior During Therapy: WFL for tasks assessed/performed Overall Cognitive Status: Within Functional Limits for tasks assessed    Balance  Balance Balance Assessed: Yes Static Sitting Balance Static Sitting - Balance Support: Right upper extremity supported;Feet supported Static Sitting - Level of Assistance: 5: Stand by assistance Static Sitting - Comment/# of Minutes: 3 min Static Standing Balance Static Standing - Balance Support: Right upper extremity supported Static Standing - Level of Assistance: 4: Min assist Static Standing - Comment/# of Minutes: 3 min for hygiene s/p BM  End of Session PT - End of Session Equipment Utilized During Treatment: Gait belt;Oxygen Activity Tolerance: Patient limited by fatigue Patient left: in chair;with call bell/phone within reach;with family/visitor present Nurse Communication: Mobility status   GP     Marcene Brawn 02/02/2013, 2:03 PM  Lewis Shock, PT, DPT Pager #: [phone] Office #: [phone]

## 2013-02-02 NOTE — Evaluation (Signed)
Occupational Therapy Evaluation Patient Details Name: [name] MRN: [id number] DOB: 02-23-1961 Today's Date: 02/02/2013 Time: 4098-1191 OT Time Calculation (min): 32 min  OT Assessment / Plan / Recommendation History of present illness [name] is an 52 y.o. male with a history of CAD s/p NSTEMI on 9/23 and admission 10/8 with recurrent chest pain secondary to acute pericarditis and cardiac tamponade (Dressler syndrome). Patient had a drain placed that was removed on the 14th due to decreased drainage. Patient has been doing well other than periods of hypotension until this morning after using the bathroom. At that time complained of being unable to use his left hand. Patient went to head CT and on return complained of difficulty seeing to the left as well.     Clinical Impression   Pt admitted with above.  He presents to OT with the below listed deficits.  He demonstrates strength of shoulder (all movements) and elbow 2-/5; forearm, wrist, and hand 0/5.  Pt initially with little to no movement shoulder and elbow, but with encouragement and AAROM, was able to assist consistently demonstrating strength 2-/5.  He will benefit from continued OT to address Lt. UE function and the below listed deficits to maximize safety and independence with BADLs and functional mobility.  Feel he would benefit from CIR at discharge.     OT Assessment  Patient needs continued OT Services    Follow Up Recommendations  CIR;Supervision/Assistance - 24 hour    Barriers to Discharge      Equipment Recommendations  3 in 1 bedside comode;Tub/shower bench    Recommendations for Other Services Rehab consult  Frequency  Min 3X/week    Precautions / Restrictions Precautions Precautions: Fall;Sternal Restrictions Weight Bearing Restrictions: No   Pertinent Vitals/Pain    ADL  Eating/Feeding: Set up Where Assessed - Eating/Feeding: Bed level Grooming: Wash/dry hands;Wash/dry face;Brushing  hair;Supervision/safety Where Assessed - Grooming: Supine, head of bed up Upper Body Bathing: Maximal assistance Where Assessed - Upper Body Bathing: Supine, head of bed up Lower Body Bathing: +1 Total assistance Where Assessed - Lower Body Bathing: Supine, head of bed up;Rolling right and/or left Upper Body Dressing: +1 Total assistance Where Assessed - Upper Body Dressing: Supine, head of bed up;Unsupported sitting Lower Body Dressing: +1 Total assistance Where Assessed - Lower Body Dressing: Supine, head of bed up;Rolling right and/or left;Supported sit to stand Toilet Transfer: +1 Total assistance (too fatigued to attempt) Transfers/Ambulation Related to ADLs: Pt did not attempt due to fatigue this pm ADL Comments: Pt able to perform simple grooming.  Pt agreeable only to bed level exercises today due to fatigue.  RN confirms that pt had a very busy day and was up in chair most of morning     OT Diagnosis: Generalized weakness;Cognitive deficits;Acute pain;Hemiplegia non-dominant side  OT Problem List: Decreased strength;Decreased range of motion;Decreased activity tolerance;Impaired balance (sitting and/or standing);Decreased coordination;Decreased cognition;Decreased knowledge of use of DME or AE;Decreased safety awareness;Decreased knowledge of precautions;Impaired UE functional use;Pain OT Treatment Interventions: Self-care/ADL training;Neuromuscular education;Therapeutic exercise;DME and/or AE instruction;Therapeutic activities;Splinting;Cognitive remediation/compensation;Visual/perceptual remediation/compensation;Patient/family education;Balance training   OT Goals(Current goals can be found in the care plan section) Acute Rehab OT Goals Patient Stated Goal: To use my arm OT Goal Formulation: With patient Time For Goal Achievement: 02/16/13 Potential to Achieve Goals: Good ADL Goals Pt Will Perform Upper Body Bathing: with supervision;sitting Pt Will Perform Lower Body Bathing:  with min assist;sit to/from stand Pt Will Perform Upper Body Dressing: with min assist;sitting Pt Will Perform  Lower Body Dressing: with mod assist;sit to/from stand Pt Will Transfer to Toilet: with min assist;ambulating;regular height toilet;bedside commode;grab bars Pt Will Perform Toileting - Clothing Manipulation and hygiene: with min assist;sit to/from stand Pt/caregiver will Perform Home Exercise Program: Increased ROM;Increased strength;Left upper extremity;With minimal assist Additional ADL Goal #1: Pt will be oriented x 4 with no use of external cues Additional ADL Goal #2: Pt will be independent with sternal precautions during all ADL activities  Visit Information  Last OT Received On: 02/02/13 Assistance Needed: +2 Reason Eval/Treat Not Completed: Patient at procedure or test/ unavailable History of Present Illness: [name] is an 52 y.o. male with a history of CAD s/p NSTEMI on 9/23 and admission 10/8 with recurrent chest pain secondary to acute pericarditis and cardiac tamponade (Dressler syndrome). Patient had a drain placed that was removed on the 14th due to decreased drainage. Patient has been doing well other than periods of hypotension until this morning after using the bathroom. At that time complained of being unable to use his left hand. Patient went to head CT and on return complained of difficulty seeing to the left as well.         Prior Functioning     Home Living Family/patient expects to be discharged to:: Inpatient rehab Living Arrangements: Children Available Help at Discharge: Family;Available 24 hours/day Type of Home: Apartment Home Access: Stairs to enter Entrance Stairs-Number of Steps: 3 Entrance Stairs-Rails: None Home Layout: One level Home Equipment: None Additional Comments: Pt reports he has tub/shower combo with curtain and standard commode  Prior Function Level of Independence: Independent Comments: works at [employer]: No difficulties Dominant Hand: Right         Vision/Perception Vision - History Baseline Vision: Wears glasses only for reading Vision - Assessment Vision Assessment: Vision tested Ocular Range of Motion: Within Functional Limits Tracking/Visual Pursuits: Able to track stimulus in all quads without difficulty;Right eye does not track laterally;Other (comment) (Pt initiates saccade when moving inferiorly Lt and Rt. ) Visual Fields: No apparent deficits Additional Comments: Pt unable to read large font without reading glasses.  Unable to accurately read clock on wall.  Time was 2:25; first guess 5:30; second guess 3:30 Perception Perception: Not tested Praxis Praxis: Not tested   Cognition  Cognition Arousal/Alertness: Awake/alert Behavior During Therapy: Flat affect Overall Cognitive Status: Impaired/Different from baseline Area of Impairment: Attention;Orientation;Following commands;Awareness;Problem solving Orientation Level: Time;Situation Current Attention Level: Sustained Memory: Decreased recall of precautions Following Commands: Follows one step commands consistently Awareness: Intellectual Problem Solving: Slow processing;Requires verbal cues General Comments: Pt thought it 2013, required significant amount of time to generate "October";  States he is in the hospital due to a strok and a seizure, has no recollection of having CABG and no awareness of precautions    Extremity/Trunk Assessment Upper Extremity Assessment Upper Extremity Assessment: LUE deficits/detail LUE Deficits / Details: shoulder and elbow 2-/5 all movements.  Pt initially stated he couldn't move Lt. UE, but with encouragement, demonstrated minimal movement, increasing to strength 2-/5.  Forearm, wrist, and hand 0/5 LUE Sensation:  (sensation appears intact) LUE Coordination: decreased fine motor;decreased gross motor Lower Extremity Assessment Lower Extremity  Assessment: Defer to PT evaluation Cervical / Trunk Assessment Cervical / Trunk Assessment:  (NT)     Mobility Bed Mobility Bed Mobility: Not assessed Transfers Transfers: Not assessed     Exercise General Exercises - Upper Extremity Shoulder Flexion: AAROM;Left;10 reps;Supine Shoulder Extension: AAROM;Left;10 reps;Supine Shoulder ABduction: AAROM;Left;10  reps;Supine Shoulder ADduction: AAROM;Left;10 reps;Supine Shoulder Horizontal ABduction: AAROM;Left;10 reps;Supine Shoulder Horizontal ADduction: AAROM;Left;10 reps;Supine Elbow Flexion: AAROM;Left;15 reps;Supine Elbow Extension: AAROM;Left;15 reps;Supine Wrist Flexion: PROM;Left;Supine Wrist Extension: PROM;Left;10 reps;Supine Digit Composite Flexion: PROM;Left;10 reps;Supine Composite Extension: PROM;Left;10 reps;Supine Hand Exercises Forearm Supination: PROM;Left;10 reps;Supine Forearm Pronation: PROM;Left;10 reps;Supine Wrist Flexion: PROM;Left;10 reps;Supine Wrist Extension: PROM;Left;10 reps;Supine   Balance     End of Session OT - End of Session Activity Tolerance: Patient limited by fatigue Patient left: in bed;with call bell/phone within reach Nurse Communication: Mobility status  GO     Darrie Macmillan, Ursula Alert M 02/02/2013, 3:05 PM

## 2013-02-03 ENCOUNTER — Inpatient Hospital Stay (HOSPITAL_COMMUNITY): Payer: Medicaid Other

## 2013-02-03 DIAGNOSIS — I2510 Atherosclerotic heart disease of native coronary artery without angina pectoris: Secondary | ICD-10-CM

## 2013-02-03 DIAGNOSIS — I6340 Cerebral infarction due to embolism of unspecified cerebral artery: Secondary | ICD-10-CM

## 2013-02-03 LAB — CBC
Hemoglobin: 9.1 g/dL — ABNORMAL LOW (ref 13.0–17.0)
MCH: 29.3 pg (ref 26.0–34.0)
MCHC: 32.3 g/dL (ref 30.0–36.0)
MCV: 90.7 fL (ref 78.0–100.0)
Platelets: 364 10*3/uL (ref 150–400)
RDW: 16.1 % — ABNORMAL HIGH (ref 11.5–15.5)
WBC: 16.6 10*3/uL — ABNORMAL HIGH (ref 4.0–10.5)

## 2013-02-03 LAB — COMPREHENSIVE METABOLIC PANEL
AST: 58 U/L — ABNORMAL HIGH (ref 0–37)
Albumin: 2.3 g/dL — ABNORMAL LOW (ref 3.5–5.2)
Alkaline Phosphatase: 148 U/L — ABNORMAL HIGH (ref 39–117)
BUN: 14 mg/dL (ref 6–23)
Calcium: 8.4 mg/dL (ref 8.4–10.5)
Chloride: 99 mEq/L (ref 96–112)
Creatinine, Ser: 0.78 mg/dL (ref 0.50–1.35)
GFR calc Af Amer: >90 mL/min (ref >90)
GFR calc non Af Amer: >90 mL/min (ref >90)
Glucose, Bld: 106 mg/dL — ABNORMAL HIGH (ref 70–99)
Potassium: 3.5 mEq/L (ref 3.5–5.1)
Total Protein: 6.1 g/dL (ref 6.0–8.3)

## 2013-02-03 LAB — CULTURE, BLOOD (ROUTINE X 2): Culture: NO GROWTH

## 2013-02-03 LAB — CLOSTRIDIUM DIFFICILE BY PCR: Toxigenic C. Difficile by PCR: NEGATIVE

## 2013-02-03 MED ORDER — POTASSIUM CHLORIDE CRYS ER 20 MEQ PO TBCR
20.0000 meq | EXTENDED_RELEASE_TABLET | Freq: Two times a day (BID) | ORAL | Status: DC
  Administered 2013-02-03 – 2013-02-04 (×3): 20 meq via ORAL
  Filled 2013-02-03 (×5): qty 1

## 2013-02-03 MED ORDER — LOPERAMIDE HCL 1 MG/5ML PO LIQD
1.0000 mg | ORAL | Status: DC | PRN
  Filled 2013-02-03: qty 5

## 2013-02-03 MED ORDER — POTASSIUM CHLORIDE 10 MEQ/50ML IV SOLN
10.0000 meq | INTRAVENOUS | Status: AC | PRN
  Administered 2013-02-03 (×3): 10 meq via INTRAVENOUS
  Filled 2013-02-03 (×3): qty 50

## 2013-02-03 MED ORDER — FUROSEMIDE 40 MG PO TABS
40.0000 mg | ORAL_TABLET | Freq: Two times a day (BID) | ORAL | Status: DC
  Administered 2013-02-03 – 2013-02-04 (×2): 40 mg via ORAL
  Filled 2013-02-03 (×4): qty 1

## 2013-02-03 NOTE — Progress Notes (Signed)
Stroke Team Progress Note  HISTORY [name] is an 52 y.o. male with a history of CAD s/p NSTEMI on 9/23 and admission 10/8 with recurrent chest pain secondary to acute pericarditis and cardiac tamponade (Dressler syndrome). Patient had a drain placed that was removed on the 14th due to decreased drainage. Patient has been doing well other than periods of hypotension until this morning after using the bathroom. At that time complained of being unable to use his left hand. Patient went to head CT and on return complained of difficulty seeing to the left as well.   Date last known well: Date: 01/28/2013  Time last known well: Time: 05:45  tPA Given: No: Patient with recent pericarditis and drainage   01/28/2013 Nursing reported respiratory distress, rigors, patient complained of not being able to move left hand with numbness to mid forearm, ataxia of left arm. CT ordered which showed the old left cerebellar infarct.  01/29/2013 Pericardial tamponade with clotted blood  Contained ruptured inferoposterior pseudoaneurysm  Extensive necrotic myocardium  3 x 5 cm defect, repaired    SUBJECTIVE Patient up working with therapists. Some left leg weakness, but able to stand. Left hand able to move minimally now whereas could not move at all before.     OBJECTIVE Most recent Vital Signs: Filed Vitals:   02/03/13 0500 02/03/13 0600 02/03/13 0700 02/03/13 0757  BP: 119/75 113/73 122/56   Pulse: 110 114 107   Temp:    98.6 F (37 C)  TempSrc:    Oral  Resp: 22 38 11   Height:      Weight: 103.9 kg (229 lb 0.9 oz)     SpO2: 96% 95% 95%    CBG (last 3)   Recent Labs  02/01/13 0356 02/01/13 0716 02/02/13 0750  GLUCAP 85 91 120*    IV Fluid Intake:   . sodium chloride 20 mL/hr at 01/30/13 2000  . sodium chloride 20 mL/hr at 01/28/13 2100  . sodium chloride    . dexmedetomidine Stopped (01/30/13 0930)  . DOPamine 3 mcg/kg/min (02/01/13 0830)  . lactated ringers 20 mL/hr at  01/28/13 2100    MEDICATIONS  . aspirin EC  325 mg Oral Daily   Or  . aspirin  324 mg Per Tube Daily  . bisacodyl  10 mg Oral Daily   Or  . bisacodyl  10 mg Rectal Daily  . docusate sodium  200 mg Oral Daily  . enoxaparin (LOVENOX) injection  40 mg Subcutaneous Q24H  . feeding supplement (ENSURE COMPLETE)  237 mL Oral TID BM  . furosemide  40 mg Intravenous BID  . imipenem-cilastatin  500 mg Intravenous Q6H  . magnesium sulfate  4 g Intravenous Once  . metoprolol tartrate  25 mg Oral TID  . pantoprazole  40 mg Oral Daily  . sodium chloride  3 mL Intravenous Q12H   PRN:  metoprolol, morphine injection, ondansetron (ZOFRAN) IV, oxyCODONE, potassium chloride, sodium chloride  Diet:  General 1  Activity:  Bedrest DVT Prophylaxis:  lovenox  CLINICALLY SIGNIFICANT STUDIES Basic Metabolic Panel:   Recent Labs Lab 01/29/13 0201 01/29/13 1700  02/02/13 0327 02/02/13 1635 02/03/13 0420  NA 142  --   < > 135 135 138  K 4.8  --   < > 3.2* 3.3* 3.5  CL 105  --   < > 94*  --  99  CO2 26  --   < > 29  --  27  GLUCOSE 145*  --   < >  114* 98 106*  BUN 28*  --   < > 17  --  14  CREATININE 1.83* 1.87*  < > 0.88  --  0.78  CALCIUM 7.0*  --   < > 8.5  --  8.4  MG 1.9 2.0  --   --   --   --   < > = values in this interval not displayed. Liver Function Tests:   Recent Labs Lab 02/03/13 0420  AST 58*  ALT 35  ALKPHOS 148*  BILITOT 0.6  PROT 6.1  ALBUMIN 2.3*   CBC:   Recent Labs Lab 02/02/13 0327 02/02/13 1635 02/03/13 0420  WBC 16.9*  --  16.6*  HGB 9.9* 8.8* 9.1*  HCT 29.5* 26.0* 28.2*  MCV 89.1  --  90.7  PLT 299  --  364   Coagulation:   Recent Labs Lab 01/28/13 1059 01/28/13 1858 01/28/13 2011 01/28/13 2050  LABPROT 16.4* 26.1* 21.4* 20.3*  INR 1.36 2.49* 1.92* 1.79*   Cardiac Enzymes:   Recent Labs Lab 01/29/13 0715  CKTOTAL 436*  CKMB 28.0*   Urinalysis:   Recent Labs Lab 01/28/13 0847  COLORURINE ORANGE*  LABSPEC 1.030  PHURINE 5.0   GLUCOSEU NEGATIVE  HGBUR NEGATIVE  BILIRUBINUR MODERATE*  KETONESUR 15*  PROTEINUR 30*  UROBILINOGEN 4.0*  NITRITE POSITIVE*  LEUKOCYTESUR TRACE*   Lipid Panel    Component Value Date/Time   CHOL 164 01/06/2013 0511   TRIG 109 01/06/2013 0511   HDL 44 01/06/2013 0511   CHOLHDL 3.7 01/06/2013 0511   VLDL 22 01/06/2013 0511   LDLCALC 98 01/06/2013 0511   HgbA1C  Lab Results  Component Value Date   HGBA1C 5.7* 01/05/2013    Urine Drug Screen:     Component Value Date/Time   LABOPIA NONE DETECTED 01/05/2013 2053   COCAINSCRNUR NONE DETECTED 01/05/2013 2053   LABBENZ NONE DETECTED 01/05/2013 2053   AMPHETMU NONE DETECTED 01/05/2013 2053   THCU NONE DETECTED 01/05/2013 2053   LABBARB NONE DETECTED 01/05/2013 2053    Alcohol Level: No results found for this basename: ETH,  in the last 168 hours  Ct Head Wo Contrast 01/28/2013   No acute abnormality. Old left cerebellar infarct. Chronic small vessel ischemic changes.    CT Angio Head: 1. New acute/ subacute nonhemorrhagic infarcts involving the left  superior cerebellum, the posterior and medial left parietal lobe and  lateral left occipital lobe, the higher left frontal lobe, the  posterior right frontal lobe and corona radiata.  2. No significant focal stenosis, aneurysm, or branch vessel  occlusion.  3. More remote lacunar infarcts involve the left cerebellum.  CT Angio Neck 1. No significant cervical stenoses. Normal appearance of the  cervical vasculature.  2. Bilateral pleural effusions and associated atelectasis.  3. Status post recent median sternotomy.  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 The effusion is located anteriorly around  the RV free wall. Maximum diameter in diastole is 10 mm. There is no collapse of the right ventricle in diastole. IVC is dilated with less than 50% respiratory variations. There are no signs of tamponade. There is akinesis of the basal and mid inferior and inferoseptal walls and apical inferior wall. Overall  left ventricular ejection fraction, 40-45 % 01/24/2013 A moderate, free-flowing pericardial effusionwas identified circumferential to the heart, predominantly anterior.The fluid contained focal strands. Doppler: The possibility of hemodynamic compromise cannot be excluded on the basis of available images. Significant mitral inflow variation is noted. The IVC does not collapse and the patient is tachycardic. 10/10/2014Left ventricle: Akinesis inferolateral wall. Dyskinesis base inferior wall. The cavity size was normal. Wall thickness was increased in a pattern of mild LVH. The estimated ejection fraction was 35%. - Right ventricle: The cavity size was moderately dilated. Systolic function was moderately reduced. - Right atrium: The atrium was mildly dilated. - Pericardium, extracardiac: Pericardial effusion is only moderate, but it is larger than yesterday. There is some RV collapse. I am concerned about possible tamponade heodynamics.  CXR    Therapy Recommendations   Filed Vitals:   02/03/13 0757  BP:   Pulse:   Temp: 98.6 F (37 C)  Resp:    Physical Exam  General: The patient is alert and cooperative at the time of the examination.  Skin: 2+ edema present on all extremities.   Neurologic Exam Awake alert oriented following commands Cranial nerves: Facial symmetry is not present. Slight depression of the left nasolabial fold. The patient has good understanding her speech, slight dysarthria. Extraocular movements are full. Visual fields are full.  Motor: The patient has good strength in the right extremities. On the left, the patient has an almost plegic left arm, 4/5 strength of  the left lower extremity. Coordination: The patient has good finger-nose-finger and toe to finger on the right, unable to perform on the left..  Gait and station: The patient could not be ambulated.  Reflexes: Deep tendon reflexes are symmetric, but are depressed   ASSESSMENT [name] is a 52 y.o. male presenting with left arm weakness, numbness and left visual field deficits. CT was able to be done and shows the acute/ subacute nonhemorrhagic infarcts involving the left superior cerebellum, the posterior and medial left parietal lobe and  lateral left occipital lobe, the higher left frontal lobe, the posterior right frontal lobe and corona radiata. Symptoms secondary to unknown source but sepsis suspected.  On aspirin 81 mg orally every day and clopidogrel 75 mg orally every day prior to admission. Currently on ASPIRIN 325mg  daily. Work up underway.   Respiratory distress, extubated  Recurrent tamponade with inferiorposterior pseudoaneurysm, necrosis, s/p OR pod # 5  Sepsis, on IV abx.  LDL 98  HGB A1C   5.7  Former smoker  Coronary artery disease, s/p stent  Hospital day # 13  TREATMENT/PLAN  Asprin started and continue.  CIR recommended.  Risk factor modification  Have patient follow up with Dr. Pearlean Brownie 2 months after discharge in stroke clinic.  Call if further needs  Stroke service will sign off.  Gwendolyn Lima. Manson Passey, [facility], MBA, MHA [facility] Pager: [phone] 02/03/2013 8:08 AM  I have personally obtained a history, examined the patient, evaluated imaging results, and formulated the assessment and plan of care. I agree with the above.  Delia Heady, MD

## 2013-02-03 NOTE — Progress Notes (Signed)
I will follow up with pt and family to discuss an inpt rehab admission prior to discharge home. [phone]

## 2013-02-03 NOTE — Progress Notes (Signed)
Agree with student note, no new changes.

## 2013-02-03 NOTE — Progress Notes (Signed)
Pacer wires removed, pt tolerated well, metal tip intact. No change in rhythm. [name] L

## 2013-02-03 NOTE — Progress Notes (Signed)
Chaplain followed up with pt and pt's finance, providing information and documents for completing an Advanced Directive. Pt and his finance, and other family, will review together. Pt may request chaplain when ready to complete Advanced Directive or with any questions. Chaplain will also follow up.   Maurene Capes, [place] [phone]

## 2013-02-03 NOTE — Progress Notes (Signed)
Occupational Therapy Treatment Patient Details Name: [name] MRN: [id number] DOB: October 10, 1960 Today's Date: 02/03/2013 Time: 8657-8469 OT Time Calculation (min): 39 min  OT Assessment / Plan / Recommendation  History of present illness [name] is an 52 y.o. male with a history of CAD s/p NSTEMI on 9/23 and admission 10/8 with recurrent chest pain secondary to acute pericarditis and cardiac tamponade (Dressler syndrome). Patient had a drain placed that was removed on the 14th due to decreased drainage. Patient has been doing well other than periods of hypotension until this morning after using the bathroom. At that time complained of being unable to use his left hand. Patient went to head CT and on return complained of difficulty seeing to the left as well.     OT comments  Pt continues to improve.  Worked on facilitation of reach Lt. UE.  He now demonstrates minimal active sup/pron, wrist flex/ext.  And slight finger movements during activity (? If has a component of motor planning deficit/apraxia).  Pt able to drink from cup using Lt. UE with moderate facilitation!  Follow Up Recommendations  CIR;Supervision/Assistance - 24 hour    Barriers to Discharge       Equipment Recommendations  3 in 1 bedside comode;Tub/shower bench    Recommendations for Other Services    Frequency Min 3X/week   Progress towards OT Goals Progress towards OT goals: Progressing toward goals  Plan Discharge plan remains appropriate    Precautions / Restrictions Precautions Precautions: Fall;Sternal   Pertinent Vitals/Pain     ADL  Eating/Feeding: Moderate assistance (drink from cup with Lt. UE) Where Assessed - Eating/Feeding: Chair ADL Comments: Worked on facilitation of reach Lt. UE in forward flexion and scaption.  Able to reach for and retrieve cup with Lt. UE and mod facilitation at ~50* of shoulder flexion, bring it to mouth to drink, and give it to therapist (mod facilitation)     OT Diagnosis:    OT Problem List:   OT Treatment Interventions:     OT Goals(current goals can now be found in the care plan section) Acute Rehab OT Goals Patient Stated Goal: To use my arm OT Goal Formulation: With patient Time For Goal Achievement: 02/16/13 Potential to Achieve Goals: Good ADL Goals Pt Will Perform Upper Body Bathing: with supervision;sitting Pt Will Perform Lower Body Bathing: with min assist;sit to/from stand Pt Will Perform Upper Body Dressing: with min assist;sitting Pt Will Perform Lower Body Dressing: with mod assist;sit to/from stand Pt Will Transfer to Toilet: with min assist;ambulating;regular height toilet;bedside commode;grab bars Pt Will Perform Toileting - Clothing Manipulation and hygiene: with min assist;sit to/from stand Pt/caregiver will Perform Home Exercise Program: Increased ROM;Increased strength;Left upper extremity;With minimal assist Additional ADL Goal #1: Pt will be oriented x 4 with no use of external cues Additional ADL Goal #2: Pt will be independent with sternal precautions during all ADL activities Additional ADL Goal #3: Pt will use Lt. UE as an active assist with min faciliation   Visit Information  Last OT Received On: 02/03/13 Assistance Needed: +2 History of Present Illness: [name] is an 52 y.o. male with a history of CAD s/p NSTEMI on 9/23 and admission 10/8 with recurrent chest pain secondary to acute pericarditis and cardiac tamponade (Dressler syndrome). Patient had a drain placed that was removed on the 14th due to decreased drainage. Patient has been doing well other than periods of hypotension until this morning after using the bathroom. At that time complained  of being unable to use his left hand. Patient went to head CT and on return complained of difficulty seeing to the left as well.      Subjective Data      Prior Functioning       Cognition  Cognition Arousal/Alertness: Awake/alert Behavior During  Therapy: Flat affect Overall Cognitive Status: Impaired/Different from baseline Current Attention Level: Selective Following Commands: Follows one step commands consistently Awareness: Intellectual Problem Solving: Slow processing;Requires verbal cues    Mobility  Bed Mobility Bed Mobility: Not assessed Transfers Transfers: Not assessed    Exercises  General Exercises - Upper Extremity Shoulder Flexion: AAROM;Left;10 reps;Seated Shoulder Extension: AAROM;Left;10 reps;Seated Shoulder ABduction: AAROM;Left;10 reps;Seated Shoulder ADduction: AAROM;Left;10 reps;Seated Elbow Flexion: AAROM;Left;10 reps;Seated Elbow Extension: AAROM;Left;10 reps;Seated Wrist Flexion: AAROM;10 reps;PROM;Seated Wrist Extension: AAROM;10 reps;Left;AROM;Seated Digit Composite Flexion: PROM;5 reps;Left;Seated Composite Extension: PROM;Left;10 reps;Seated Hand Exercises Forearm Supination: AAROM;Left;10 reps;Seated Forearm Pronation: AAROM;Left;10 reps;Seated   Balance     End of Session OT - End of Session Activity Tolerance: Patient tolerated treatment well Patient left: in chair;with family/visitor present Nurse Communication: Mobility status  GO     Kiyra Slaubaugh, Ursula Alert M 02/03/2013, 6:14 PM

## 2013-02-03 NOTE — Progress Notes (Signed)
Patient ID: [name], male   DOB: 1960/05/24, 52 y.o.   MRN: [id number]  Plan: Continue impenem  Assessment: Pericarditis  6 Days Post-Op Procedure(s) (LRB):  CORONARY ARTERY BYPASS GRAFTING (CABG) (N/A)  INTRAOPERATIVE TRANSESOPHAGEAL ECHOCARDIOGRAM (N/A)  LEFT VENTRICULAR ANEURYSM REPAIR (N/A)   Subjectively: Overall, he feels much better compared to before and has no complaints. There are no complains of chest pain, shortness of breath, fever, chills or sweats.  Objectively:  10/20 0700 10/21 0659 10/21 0700 10/21 0842  Most Recent   Temp (F)  98.4 -  98.9   98.6 -  98.6  98.6 (37) 10/21 0757 Pulse  101 -  117   107 -  107  107 10/21 0700 Resp  13 -  38   11 -  11  11 10/21 0700 BP  103/80 -  126/54   122/56 -  122/56  122/56 10/21 0700 SpO2 (%)  91 -  99   95 -  95  95 10/21 0700   Selected Labs (Up to last 3 results from past 72 hours) Report       10/20 0327   10/20 1635   10/21 0420      WBC 16.9     16.6      RBC 3.31     3.11      Hemoglobin 9.9   8.8   9.1      HCT 29.5   26.0   28.2      Platelets 299    364     Sodium 135  135  138     Potassium 3.2   3.3   3.5     Chloride 94     99     CO2 29    27     BUN 17    14     Creatinine 0.88    0.78     Calcium 8.5    8.4     Glucose 114   98  106        CVS: S1+S2+0 Resp: Normal vesicular breathing, no added sounds Abdomen: soft, non tender, gs+ve Other exams normal  EXAM:  PORTABLE CHEST - 1 VIEW  COMPARISON: 01/31/2013.  FINDINGS:  Persistent cardiomegaly. Improving pulmonary aeration with  decreasing basilar atelectasis. Unchanged right IJ central line.  Median sternotomy. Epicardial pacing leads remain present.  Monitoring leads project over the chest. Retrocardiac density is  present, most compatible with atelectasis.  IMPRESSION:  1. Stable support apparatus.  2. Unchanged cardiomegaly.  3. Improving aeration with decreasing basilar atelectasis.     Clostridium Difficile by PCR [[id number]] Collected: 02/03/13 0605 Updated: 02/03/13 0837 C difficile by pcr NEGATIVE Culture,   blood (routine x 2) [[id number]] Collected: 01/28/13 1130 Updated: 02/03/13 0802 Specimen Type: Blood Specimen Source: Peripheral Specimen Description BLOOD LEFT ARM Special Requests BOTTLES DRAWN AEROBIC AND ANAEROBIC 10CC Culture Setup Time - Result: 01/28/2013 14:06 Performed at [facility] Culture - Result: NO GROWTH 5 DAYS Performed at [facility]  Tissue culture [[id number]] Collected: 01/28/13 1542 Order Status: Completed Updated: 01/31/13 0753 Specimen Description TISSUE PERICARDIAL Special Requests NO 2 PT ON ZINACEF Gram Stain - Result: FEW WBC PRESENT,BOTH PMN AND MONONUCLEAR NO ORGANISMS SEEN Performed at [facility] Culture - Result: NO GROWTH 3 DAYS Performed at [facility] Report Status 01/31/2013 FINAL   Wound culture [[id number]] Collected: 01/28/13 1605 Order Status: Completed Updated: 01/30/13 0721 Specimen Description WOUND Special Requests  Result: PATIENT ON FOLLOWING ZINACEF LEFT VENTRICULAR CLOT Gram Stain - Result: MODERATE WBC PRESENT, PREDOMINANTLY PMN NO SQUAMOUS EPITHELIAL CELLS SEEN NO ORGANISMS SEEN Performed at [facility] Culture - Result: NO GROWTH 2 DAYS Performed at [facility]

## 2013-02-03 NOTE — Progress Notes (Signed)
6 Days Post-Op Procedure(s) (LRB): CORONARY ARTERY BYPASS GRAFTING (CABG) (N/A) INTRAOPERATIVE TRANSESOPHAGEAL ECHOCARDIOGRAM (N/A) LEFT VENTRICULAR ANEURYSM REPAIR (N/A) Subjective: No new complaints Had a loose stool this AM  Objective: Vital signs in last 24 hours: Temp:  [98.4 F (36.9 C)-98.9 F (37.2 C)] 98.6 F (37 C) (10/21 0757) Pulse Rate:  [101-117] 107 (10/21 0700) Cardiac Rhythm:  [-] Sinus tachycardia (10/21 0400) Resp:  [11-38] 11 (10/21 0700) BP: (103-126)/(52-80) 122/56 mmHg (10/21 0700) SpO2:  [91 %-97 %] 95 % (10/21 0700) Weight:  [229 lb 0.9 oz (103.9 kg)] 229 lb 0.9 oz (103.9 kg) (10/21 0500)  Hemodynamic parameters for last 24 hours:    Intake/Output from previous day: 10/20 0701 - 10/21 0700 In: 1360 [P.O.:120; I.V.:540; IV Piggyback:700] Out: 2926 [Urine:2925; Stool:1] Intake/Output this shift:    General appearance: alert and no distress Neurologic: no change Heart: tachy, regular Lungs: diminished breath sounds bibasilar Wound: clean and dry  Lab Results:  Recent Labs  02/02/13 0327 02/02/13 1635 02/03/13 0420  WBC 16.9*  --  16.6*  HGB 9.9* 8.8* 9.1*  HCT 29.5* 26.0* 28.2*  PLT 299  --  364   BMET:  Recent Labs  02/02/13 0327 02/02/13 1635 02/03/13 0420  NA 135 135 138  K 3.2* 3.3* 3.5  CL 94*  --  99  CO2 29  --  27  GLUCOSE 114* 98 106*  BUN 17  --  14  CREATININE 0.88  --  0.78  CALCIUM 8.5  --  8.4    PT/INR: No results found for this basename: LABPROT, INR,  in the last 72 hours ABG    Component Value Date/Time   PHART 7.516* 01/30/2013 1103   HCO3 29.3* 01/30/2013 1103   TCO2 30 01/30/2013 1103   ACIDBASEDEF 4.0* 01/28/2013 2112   O2SAT 93.0 01/30/2013 1103   CBG (last 3)   Recent Labs  02/01/13 0356 02/01/13 0716 02/02/13 0750  GLUCAP 85 91 120*    Assessment/Plan: S/P Procedure(s) (LRB): CORONARY ARTERY BYPASS GRAFTING (CABG) (N/A) INTRAOPERATIVE TRANSESOPHAGEAL ECHOCARDIOGRAM (N/A) LEFT  VENTRICULAR ANEURYSM REPAIR (N/A) - NEURO- Ct showed multiple strokes- not surprising  Continue PT/OT/Speech  Rehab  CV- stable, still mildly tachycardic  BNP improved  Dc EPW  RESP- stable, continue pulmonary hygiene  RENAL- still diuresing well, will change to PO lasix  Dc foley  Supplement potassium  GI- diarrhea- c diff pending- imodium PRN, dc stool softeners  ENDO- CBG well controlled  ID- vanco and diflucan discontinued, remains on primaxin  I think he is pretty close to being ready for inpatient rehab for his stroke   LOS: 13 days    HENDRICKSON,STEVEN C 02/03/2013

## 2013-02-03 NOTE — Progress Notes (Signed)
Physical Therapy Treatment Patient Details Name: [name] DOB: [birth year] Today's Date: 02/03/2013 Time: 7829-5621 PT Time Calculation (min): 27 min  PT Assessment / Plan / Recommendation  History of Present Illness [name] is an 52 y.o. male with a history of CAD s/p NSTEMI on 9/23 and admission 10/8 with recurrent chest pain secondary to acute pericarditis and cardiac tamponade (Dressler syndrome). Patient had a drain placed that was removed on the 14th due to decreased drainage. Patient has been doing well other than periods of hypotension until this morning after using the bathroom. At that time complained of being unable to use his left hand. Patient went to head CT and on return complained of difficulty seeing to the left as well.     PT Comments   Pt with increased ambulation tolerance this date and noted L UE trace bicep/tricep contraction. Pt progressing well and remains to be an excellent CIR candidate to achieve maximal functional recovery prior to transitioning home.  Follow Up Recommendations  CIR     Does the patient have the potential to tolerate intense rehabilitation     Barriers to Discharge        Equipment Recommendations       Recommendations for Other Services Rehab consult  Frequency Min 4X/week   Progress towards PT Goals Progress towards PT goals: Progressing toward goals  Plan Current plan remains appropriate    Precautions / Restrictions Precautions Precautions: Fall;Sternal Restrictions Weight Bearing Restrictions: No   Pertinent Vitals/Pain Pt denies pain. C/o nausea    Mobility  Bed Mobility Bed Mobility: Not assessed Transfers Transfers: Sit to Stand;Stand to Sit Sit to Stand: 3: Mod assist Stand to Sit: 4: Min assist Details for Transfer Assistance: v/c's to adhere to sternal precautions, assist to achieve initial anterior weight-shift Ambulation/Gait Ambulation/Gait Assistance: 3: Mod assist Ambulation  Distance (Feet): 50 Feet Assistive device: 1 person hand held assist Ambulation/Gait Assistance Details: pt guarded, hesitant. unable to use walker due to inability to grip with L UE. v/c's to increase step length. limited by onset of dizziness Gait Pattern: Decreased stride length;Step-through pattern;Wide base of support;Decreased stance time - left Gait velocity: slow Stairs: No Modified Rankin (Stroke Patients Only) Pre-Morbid Rankin Score: No symptoms Modified Rankin: Moderately severe disability    Exercises Total Joint Exercises Hip ABduction/ADduction: AAROM;10 reps;Seated General Exercises - Upper Extremity Elbow Flexion: AAROM;Left;10 reps;Seated (pt with noted bicep contraction) Elbow Extension: AAROM;Left;10 reps;Seated (pt with noted tricep contraction) General Exercises - Lower Extremity Long Arc Quad: AROM;Both;10 reps;Seated Hip Flexion/Marching: AROM;Both;10 reps;Seated Toe Raises: AROM;Both;10 reps;Seated Heel Raises: AROM;Both;10 reps;Seated   PT Diagnosis:    PT Problem List:   PT Treatment Interventions:     PT Goals (current goals can now be found in the care plan section)    Visit Information  Last PT Received On: 02/03/13 Assistance Needed: +2 History of Present Illness: [name] is an 52 y.o. male with a history of CAD s/p NSTEMI on 9/23 and admission 10/8 with recurrent chest pain secondary to acute pericarditis and cardiac tamponade (Dressler syndrome). Patient had a drain placed that was removed on the 14th due to decreased drainage. Patient has been doing well other than periods of hypotension until this morning after using the bathroom. At that time complained of being unable to use his left hand. Patient went to head CT and on return complained of difficulty seeing to the left as well.      Subjective Data  Subjective: pt agreeable to PT   Cognition  Cognition Arousal/Alertness: Awake/alert Behavior During Therapy: Flat affect Overall  Cognitive Status: Impaired/Different from baseline Area of Impairment: Safety/judgement;Following commands;Problem solving Current Attention Level: Sustained Memory: Decreased recall of precautions Following Commands: Follows one step commands consistently Awareness: Intellectual Problem Solving: Slow processing;Requires verbal cues General Comments: freq verbal cues to complete task, increased time    Balance  Balance Balance Assessed: Yes Static Sitting Balance Static Sitting - Balance Support: Right upper extremity supported;Feet supported Static Sitting - Level of Assistance: 5: Stand by assistance Static Sitting - Comment/# of Minutes: 5 min Static Standing Balance Static Standing - Balance Support: Right upper extremity supported Static Standing - Level of Assistance: 4: Min assist Static Standing - Comment/# of Minutes: 3 min to talk to Dr. Pearlean Brownie  End of Session PT - End of Session Equipment Utilized During Treatment: Gait belt Activity Tolerance: Patient limited by fatigue Patient left: in chair;with call bell/phone within reach;with family/visitor present Nurse Communication: Mobility status   GP     Marcene Brawn 02/03/2013, 10:57 AM   Lewis Shock, PT, DPT Pager #: [phone] Office #: [phone]

## 2013-02-03 NOTE — Consult Note (Signed)
Physical Medicine and Rehabilitation Consult  Reason for Consult: LUE/LLE weakness with visual deficits Referring Physician: Dr. Dorris Fetch   HPI: [name] is a 52 y.o. male with history of CAD and recent NSTEMI 01/05/13 treated medically. He was readmitted on 01/31/13 with recurrent chest pain due to acute pericarditis and cardiac tamponade (Dressler syndrome). He underwent pericardiocentesis 10/11 with improvement in symptoms and drain removed 10/14. On 10/15, he developed LUE sensory loss with left hand weakness and ataxia,left  visual deficits. He was noted to be hypotensive, hypoxic with fever and chills. He was intubated and started on broad spectrum antibiotics for sepsis and CT head negative.  Repeat echo showed a complex effusion with RV collapse and tamponade. He was taken to OR the same day for CABG X1 with repair of LV ruptured inferoposterior pseudoaneurysm by Dr. Dorris Fetch.  Dr. Luciana Axe and Dr. Pearlean Brownie consulted for input. CTA head/neck with New acute/ subacute nonhemorrhagic infarcts involving the left superior cerebellum, the posterior and medial left parietal lobe and lateral left occipital lobe, the higher left frontal lobe, the posterior right frontal lobe and corona radiata. He was extubated without difficulty. Neurology feels that patient with embolic stroke on unknown source but suspected to be due to sepsis. Antibiotics narrowed to imipenem with recommendations for 14 total days antibiotics--stop date 10/29.  Swallow evaluation done yesterday and patient started on regular diet. Therapies ongoing and CIR recommended by rehab team and [place].   Review of Systems  HENT: Negative for hearing loss.   Eyes: Positive for blurred vision. Negative for double vision and photophobia.  Respiratory: Positive for cough. Negative for sputum production and shortness of breath.   Cardiovascular: Positive for chest pain (mild chest wall pain). Negative for palpitations.  Gastrointestinal:  Positive for diarrhea. Negative for heartburn and nausea.  Musculoskeletal: Negative for back pain and neck pain.  Neurological: Positive for sensory change and focal weakness. Negative for headaches.   Past Medical History  Diagnosis Date  . Coronary artery disease     a. s/p MI in 2009 in [place] with stenting of the LCX and LAD;  b. 12/2012 NSTEMI/CAD: LM nl, LAD patent prox stent, LCX 50-70 isr (FFR 0.84), RCA dom, 137m, EF 40-45%, sev basal to mid inf HK to AK-->Med Rx.  . Tobacco abuse     a. quit 12/2012.  . Ischemic cardiomyopathy     a. 12/2012 Ech: EF 40-45%, Gr 1 DD.  Marland Kitchen Hyperlipidemia    Past Surgical History  Procedure Laterality Date  . Coronary angioplasty with stent placement  2000's X 2    "1 + 1" (01/06/2013)  . Cardiac catheterization  01/06/2013  . Coronary artery bypass graft N/A 01/28/2013    Procedure: CORONARY ARTERY BYPASS GRAFTING (CABG);  Surgeon: Loreli Slot, MD;  Location: [facility] OR;  Service: Open Heart Surgery;  Laterality: N/A;  CABG x one, using left greater saphenous vein harvested endoscopically  . Intraoperative transesophageal echocardiogram N/A 01/28/2013    Procedure: INTRAOPERATIVE TRANSESOPHAGEAL ECHOCARDIOGRAM;  Surgeon: Loreli Slot, MD;  Location: [facility] OR;  Service: Open Heart Surgery;  Laterality: N/A;  . Ventricular aneurysm resection N/A 01/28/2013    Procedure: LEFT VENTRICULAR ANEURYSM REPAIR;  Surgeon: Loreli Slot, MD;  Location: [facility] OR;  Service: Open Heart Surgery;  Laterality: N/A;   Family History  Problem Relation Age of Onset  . CAD     Social History: Lives  reports that he quit smoking about 4 weeks ago. His smoking use included Cigarettes.  He has a 35 pack-year smoking history. He has never used smokeless tobacco. He reports that he does not drink alcohol or use illicit drugs.  Allergies: No Known Allergies   Medications Prior to Admission  Medication Sig Dispense Refill  . aspirin EC 81 MG EC tablet Take 1 tablet  (81 mg total) by mouth daily.  30 tablet  0  . clopidogrel (PLAVIX) 75 MG tablet Take 1 tablet (75 mg total) by mouth daily.  30 tablet  11  . lisinopril (PRINIVIL,ZESTRIL) 5 MG tablet Take 1 tablet (5 mg total) by mouth daily.  30 tablet  11  . metoprolol tartrate (LOPRESSOR) 25 MG tablet Take 1 tablet (25 mg total) by mouth 2 (two) times daily.  60 tablet  11  . nitroGLYCERIN (NITROSTAT) 0.4 MG SL tablet Place 1 tablet (0.4 mg total) under the tongue every 5 (five) minutes as needed for chest pain.  25 tablet  3  . simvastatin (ZOCOR) 40 MG tablet Take 1 tablet (40 mg total) by mouth at bedtime.  30 tablet  11    Home: Home Living Family/patient expects to be discharged to:: Inpatient rehab Living Arrangements: Children Available Help at Discharge: Family;Available 24 hours/day Type of Home: Apartment Home Access: Stairs to enter Entrance Stairs-Number of Steps: 3 Entrance Stairs-Rails: None Home Layout: One level Home Equipment: None Additional Comments: Pt reports he has tub/shower combo with curtain and standard commode   Functional History: Prior Function Comments: works at [employer] Status:  Mobility: Bed Mobility Bed Mobility: Not assessed Sit to Supine: 3: Mod assist Transfers Transfers: Sit to Stand;Stand to Sit Sit to Stand: 3: Mod assist Sit to Stand: Patient Percentage: 50% Stand to Sit: 4: Min assist Stand to Sit: Patient Percentage: 60% Ambulation/Gait Ambulation/Gait Assistance: 3: Mod assist (+1 to manage lines and chair) Ambulation Distance (Feet): 10 Feet Assistive device: 1 person hand held assist Ambulation/Gait Assistance Details: pt limited by dizziness. Pt unable to use RW due to L UE flaccidity. Gait Pattern: Step-to pattern;Decreased stride length;Wide base of support Gait velocity: slow Stairs: No    ADL: ADL Eating/Feeding: Set up Where Assessed - Eating/Feeding: Bed level Grooming: Wash/dry hands;Wash/dry face;Brushing  hair;Supervision/safety Where Assessed - Grooming: Supine, head of bed up Upper Body Bathing: Maximal assistance Where Assessed - Upper Body Bathing: Supine, head of bed up Lower Body Bathing: +1 Total assistance Where Assessed - Lower Body Bathing: Supine, head of bed up;Rolling right and/or left Upper Body Dressing: +1 Total assistance Where Assessed - Upper Body Dressing: Supine, head of bed up;Unsupported sitting Lower Body Dressing: +1 Total assistance Where Assessed - Lower Body Dressing: Supine, head of bed up;Rolling right and/or left;Supported sit to stand Toilet Transfer: +1 Total assistance (too fatigued to attempt) Transfers/Ambulation Related to ADLs: Pt did not attempt due to fatigue this pm ADL Comments: Pt able to perform simple grooming.  Pt agreeable only to bed level exercises today due to fatigue.  RN confirms that pt had a very busy day and was up in chair most of morning   Cognition: Cognition Overall Cognitive Status: Impaired/Different from baseline Orientation Level: Oriented X4 Cognition Arousal/Alertness: Awake/alert Behavior During Therapy: Flat affect Overall Cognitive Status: Impaired/Different from baseline Area of Impairment: Attention;Orientation;Following commands;Awareness;Problem solving Orientation Level: Time;Situation Current Attention Level: Sustained Memory: Decreased recall of precautions Following Commands: Follows one step commands consistently Awareness: Intellectual Problem Solving: Slow processing;Requires verbal cues General Comments: Pt thought it 2013, required significant amount of time to generate "October";  States he is in the hospital due to a strok and a seizure, has no recollection of having CABG and no awareness of precautions  Blood pressure 108/72, pulse 108, temperature 98.6 F (37 C), temperature source Oral, resp. rate 14, height 6\' 1"  (1.854 m), weight 103.9 kg (229 lb 0.9 oz), SpO2 97.00%. Physical Exam  Nursing note  and vitals reviewed. Constitutional: He is oriented to person, place, and time. He appears well-developed and well-nourished.  HENT:  Head: Normocephalic and atraumatic.  Eyes: Conjunctivae are normal. Pupils are equal, round, and reactive to light.  Neck: Normal range of motion. Neck supple.  Cardiovascular: Regular rhythm.  Tachycardia present.   Respiratory: Effort normal and breath sounds normal. No respiratory distress. He has no wheezes.  GI: Soft. He exhibits no distension. Bowel sounds are decreased. There is no tenderness.  Musculoskeletal: He exhibits edema (1+ left shin).  Neurological: He is alert and oriented to person, place, and time.  Mild left facial droop. Speech clear. Follows commands without difficulty. Cognitively appropriate. Left deltoid trace, 1/5 at bicep and tricep. LLE is 2/5 HF, 3/5 KE, 3-4/5 at ADF, APF.   Skin: Skin is warm and dry.  Psychiatric: He has a normal mood and affect. His behavior is normal.    Results for orders placed during the hospital encounter of 01/21/13 (from the past 24 hour(s))  POCT I-STAT 4, (NA,K, GLUC, HGB,HCT)     Status: Abnormal   Collection Time    02/02/13  4:35 PM      Result Value Range   Sodium 135  135 - 145 mEq/L   Potassium 3.3 (*) 3.5 - 5.1 mEq/L   Glucose, Bld 98  70 - 99 mg/dL   HCT 66.4 (*) 40.3 - 47.4 %   Hemoglobin 8.8 (*) 13.0 - 17.0 g/dL  CBC     Status: Abnormal   Collection Time    02/03/13  4:20 AM      Result Value Range   WBC 16.6 (*) 4.0 - 10.5 K/uL   RBC 3.11 (*) 4.22 - 5.81 MIL/uL   Hemoglobin 9.1 (*) 13.0 - 17.0 g/dL   HCT 25.9 (*) 56.3 - 87.5 %   MCV 90.7  78.0 - 100.0 fL   MCH 29.3  26.0 - 34.0 pg   MCHC 32.3  30.0 - 36.0 g/dL   RDW 64.3 (*) 32.9 - 51.8 %   Platelets 364  150 - 400 K/uL  COMPREHENSIVE METABOLIC PANEL     Status: Abnormal   Collection Time    02/03/13  4:20 AM      Result Value Range   Sodium 138  135 - 145 mEq/L   Potassium 3.5  3.5 - 5.1 mEq/L   Chloride 99  96 - 112  mEq/L   CO2 27  19 - 32 mEq/L   Glucose, Bld 106 (*) 70 - 99 mg/dL   BUN 14  6 - 23 mg/dL   Creatinine, Ser 8.41  0.50 - 1.35 mg/dL   Calcium 8.4  8.4 - 66.0 mg/dL   Total Protein 6.1  6.0 - 8.3 g/dL   Albumin 2.3 (*) 3.5 - 5.2 g/dL   AST 58 (*) 0 - 37 U/L   ALT 35  0 - 53 U/L   Alkaline Phosphatase 148 (*) 39 - 117 U/L   Total Bilirubin 0.6  0.3 - 1.2 mg/dL   GFR calc non Af Amer >90  >90 mL/min   GFR calc Af Amer >90  >  90 mL/min  CLOSTRIDIUM DIFFICILE BY PCR     Status: None   Collection Time    02/03/13  6:05 AM      Result Value Range   C difficile by pcr NEGATIVE  NEGATIVE   Ct Angio Head W/cm &/or Wo Cm  02/02/2013   CLINICAL DATA:  Left arm weakness and numbness. Left visual field defects.  EXAM: CT ANGIOGRAPHY HEAD AND NECK  TECHNIQUE: Multidetector CT imaging of the head and neck was performed using the standard protocol during bolus administration of intravenous contrast. Multiplanar CT image reconstructions including MIPs were obtained to evaluate the vascular anatomy. Carotid stenosis measurements (when applicable) are obtained utilizing NASCET criteria, using the distal internal carotid diameter as the denominator.  CONTRAST:  80mL OMNIPAQUE IOHEXOL 350 MG/ML SOLN  COMPARISON:  CT head without contrast 01/28/2013.  FINDINGS: CTA HEAD FINDINGS  Multiple new nonhemorrhagic infarcts involve the left superior cerebellum, the posterior left parietal, the lateral left occipital cortex, and the posterior right frontal lobe. An additional area of acute nonhemorrhagic infarction is present in the anterior left frontal lobe on image 19 of series 2. A sub cm white matter infarct is present in the right coronal radiata on image 19 as well.  The postcontrast images and exhibits same areas of the infarct. No pathologic enhancement is present.  Ventricles are of normal size. No significant extra-axial fluid collection is present. The paranasal sinuses and mastoid air cells are clear. The osseous  skull is intact.  The A1 and M1 segments are normal. The left anterior cerebral artery is fenestrated near the anti communicating artery. The aging communicating artery is patent. The ACA and MCA branch vessels are within normal limits.  The right vertebral artery is slightly dominant to the left. The PICA origins are visualized and within normal limits bilaterally. The basilar artery is normal. The right posterior cerebral artery is of fetal type. The left posterior cerebral artery originates from the basilar tip and the left posterior communicating artery.  The source images demonstrate no additional infarcts. More remote lacunar infarcts are present within the left cerebellum.  Review of the MIP images confirms the above findings.  CTA NECK FINDINGS  A standard 3 vessel arch configuration is present. Both vertebral arteries originate from the subclavian arteries. The right vertebral artery is the dominant vessel. There is no significant stenoses or significant caliber change of the vertebral arteries within the cervical vertebral arteries.  The right common carotid artery is within normal limits. The bifurcation is unremarkable. Cervical right internal carotid artery is or bulb.  The left common carotid artery is within normal limits. The bifurcation is unremarkable. The cervical ICA is normal.  The soft tissues of the neck are unremarkable. The thyroid is within normal limits.  The patient is status post recent median sternotomy. Bilateral pleural effusions and associated atelectasis remaining. There is no significant cervical adenopathy.  Review of the MIP images confirms the above findings.  IMPRESSION: CTA HEAD IMPRESSION  1. New acute/ subacute nonhemorrhagic infarcts involving the left superior cerebellum, the posterior and medial left parietal lobe and lateral left occipital lobe, the higher left frontal lobe, the posterior right frontal lobe and corona radiata. 2. No significant focal stenosis, aneurysm,  or branch vessel occlusion. 3. More remote lacunar infarcts involve the left cerebellum.  CTA NECK IMPRESSION  1. No significant cervical stenoses. Normal appearance of the cervical vasculature. 2. Bilateral pleural effusions and associated atelectasis. 3. Status post recent median sternotomy.  Electronically Signed   By: Gennette Pac M.D.   On: 02/02/2013 13:54   Ct Angio Neck W/cm &/or Wo/cm  02/02/2013   CLINICAL DATA:  Left arm weakness and numbness. Left visual field defects.  EXAM: CT ANGIOGRAPHY HEAD AND NECK  TECHNIQUE: Multidetector CT imaging of the head and neck was performed using the standard protocol during bolus administration of intravenous contrast. Multiplanar CT image reconstructions including MIPs were obtained to evaluate the vascular anatomy. Carotid stenosis measurements (when applicable) are obtained utilizing NASCET criteria, using the distal internal carotid diameter as the denominator.  CONTRAST:  80mL OMNIPAQUE IOHEXOL 350 MG/ML SOLN  COMPARISON:  CT head without contrast 01/28/2013.  FINDINGS: CTA HEAD FINDINGS  Multiple new nonhemorrhagic infarcts involve the left superior cerebellum, the posterior left parietal, the lateral left occipital cortex, and the posterior right frontal lobe. An additional area of acute nonhemorrhagic infarction is present in the anterior left frontal lobe on image 19 of series 2. A sub cm white matter infarct is present in the right coronal radiata on image 19 as well.  The postcontrast images and exhibits same areas of the infarct. No pathologic enhancement is present.  Ventricles are of normal size. No significant extra-axial fluid collection is present. The paranasal sinuses and mastoid air cells are clear. The osseous skull is intact.  The A1 and M1 segments are normal. The left anterior cerebral artery is fenestrated near the anti communicating artery. The aging communicating artery is patent. The ACA and MCA branch vessels are within normal  limits.  The right vertebral artery is slightly dominant to the left. The PICA origins are visualized and within normal limits bilaterally. The basilar artery is normal. The right posterior cerebral artery is of fetal type. The left posterior cerebral artery originates from the basilar tip and the left posterior communicating artery.  The source images demonstrate no additional infarcts. More remote lacunar infarcts are present within the left cerebellum.  Review of the MIP images confirms the above findings.  CTA NECK FINDINGS  A standard 3 vessel arch configuration is present. Both vertebral arteries originate from the subclavian arteries. The right vertebral artery is the dominant vessel. There is no significant stenoses or significant caliber change of the vertebral arteries within the cervical vertebral arteries.  The right common carotid artery is within normal limits. The bifurcation is unremarkable. Cervical right internal carotid artery is or bulb.  The left common carotid artery is within normal limits. The bifurcation is unremarkable. The cervical ICA is normal.  The soft tissues of the neck are unremarkable. The thyroid is within normal limits.  The patient is status post recent median sternotomy. Bilateral pleural effusions and associated atelectasis remaining. There is no significant cervical adenopathy.  Review of the MIP images confirms the above findings.  IMPRESSION: CTA HEAD IMPRESSION  1. New acute/ subacute nonhemorrhagic infarcts involving the left superior cerebellum, the posterior and medial left parietal lobe and lateral left occipital lobe, the higher left frontal lobe, the posterior right frontal lobe and corona radiata. 2. No significant focal stenosis, aneurysm, or branch vessel occlusion. 3. More remote lacunar infarcts involve the left cerebellum.  CTA NECK IMPRESSION  1. No significant cervical stenoses. Normal appearance of the cervical vasculature. 2. Bilateral pleural effusions and  associated atelectasis. 3. Status post recent median sternotomy.   Electronically Signed   By: Gennette Pac M.D.   On: 02/02/2013 13:54   Dg Chest Port 1 View  02/03/2013   CLINICAL DATA:  Coronary artery bypass graft.  Followup atelectasis.  EXAM: PORTABLE CHEST - 1 VIEW  COMPARISON:  01/31/2013.  FINDINGS: Persistent cardiomegaly. Improving pulmonary aeration with decreasing basilar atelectasis. Unchanged right IJ central line. Median sternotomy. Epicardial pacing leads remain present. Monitoring leads project over the chest. Retrocardiac density is present, most compatible with atelectasis.  IMPRESSION: 1. Stable support apparatus. 2. Unchanged cardiomegaly. 3. Improving aeration with decreasing basilar atelectasis.   Electronically Signed   By: Andreas Newport M.D.   On: 02/03/2013 07:40    Assessment/Plan: Diagnosis: embolic bicerebral infarcts with predominantly left sided weakness 1. Does the need for close, 24 hr/day medical supervision in concert with the patient's rehab needs make it unreasonable for this patient to be served in a less intensive setting? Yes 2. Co-Morbidities requiring supervision/potential complications: MI, percardial tamponade 3. Due to bladder management, bowel management, safety, skin/wound care, disease management, medication administration, pain management and patient education, does the patient require 24 hr/day rehab nursing? Yes 4. Does the patient require coordinated care of a physician, rehab nurse, PT (1-2 hrs/day, 5 days/week), OT (1-2 hrs/day, 5 days/week) and SLP (1-2 hrs/day, 5 days/week) to address physical and functional deficits in the context of the above medical diagnosis(es)? Yes Addressing deficits in the following areas: balance, endurance, locomotion, strength, transferring, bowel/bladder control, bathing, dressing, feeding, grooming, toileting, cognition and psychosocial support 5. Can the patient actively participate in an intensive therapy  program of at least 3 hrs of therapy per day at least 5 days per week? Yes 6. The potential for patient to make measurable gains while on inpatient rehab is excellent 7. Anticipated functional outcomes upon discharge from inpatient rehab are supervision to min assist with PT, supervision to min assist with OT, mod I with SLP. 8. Estimated rehab length of stay to reach the above functional goals is: 3 weeks 9. Does the patient have adequate social supports to accommodate these discharge functional goals? Yes 10. Anticipated D/C setting: Home 11. Anticipated post D/C treatments: HH therapy 12. Overall Rehab/Functional Prognosis: excellent  RECOMMENDATIONS: This patient's condition is appropriate for continued rehabilitative care in the following setting: CIR Patient has agreed to participate in recommended program. Yes Note that insurance prior authorization may be required for reimbursement for recommended care.  Comment: Rehab RN to follow up.   Ranelle Oyster, MD, [place] [facility]     02/03/2013

## 2013-02-03 NOTE — Progress Notes (Signed)
ANTIBIOTIC CONSULT NOTE - FOLLOW UP  Pharmacy Consult for  Imipenem Indication: rule out sepsis, pericarditis  No Known Allergies  Patient Measurements: Height: 6\' 1"  (185.4 cm) Weight: 229 lb 0.9 oz (103.9 kg) IBW/kg (Calculated) : 79.9  Vital Signs: Temp: 98.6 F (37 C) (10/21 0757) Temp src: Oral (10/21 0757) BP: 108/72 mmHg (10/21 0900) Pulse Rate: 108 (10/21 0900) Intake/Output from previous day: 10/20 0701 - 10/21 0700 In: 1360 [P.O.:120; I.V.:540; IV Piggyback:700] Out: 2926 [Urine:2925; Stool:1] Intake/Output from this shift: Total I/O In: 160 [I.V.:10; IV Piggyback:150] Out: 1300 [Urine:1300]  Labs:  Recent Labs  02/01/13 0420  02/02/13 0327 02/02/13 1635 02/03/13 0420  WBC 17.1*  --  16.9*  --  16.6*  HGB 9.7*  < > 9.9* 8.8* 9.1*  PLT 264  --  299  --  364  CREATININE 0.92  --  0.88  --  0.78  < > = values in this interval not displayed. Estimated Creatinine Clearance: 138.3 ml/min (by C-G formula based on Cr of 0.78). No results found for this basename: Rolm Gala, VANCORANDOM, GENTTROUGH, GENTPEAK, GENTRANDOM, TOBRATROUGH, TOBRAPEAK, TOBRARND, AMIKACINPEAK, AMIKACINTROU, AMIKACIN,  in the last 72 hours     Assessment: 52 yo M admitted 01/21/2013 With CP and STE. Developed acute pericarditis and effusion with tamponode s/p pericardial drain removed 10/14. Imipenem continues (flconazole and vancomycin have been d/c) for a total of 14 days per ID (last day 10/29). WBC= 16.6, afeb, SCr= 0.87 and CrCl > 100.  10/15 Vanc>> (10/17 V random= 13.8) 10/20 10/15 Primaxin>> 10/15 Zosyn x 1 10/15 Fluconazole>>10/20  10/15 Resp>>non-patho flora FINAL 10/15 Wound pericardial>> neg FINAL 10/15 Blood x 2>>neg 10/15 pericardial tissue>> FINAL  10/11 pericardail Fluid- neg 10/15 pericardial wound yeast cx>> ngtd 10/21 c diff- neg  Plan: -No imipenem adjustments needed -Will follow patient progress  Harland German, Pharm D 02/03/2013 9:29 AM

## 2013-02-03 NOTE — Progress Notes (Signed)
Rehab Admissions Coordinator Note:  Patient was screened by Trish Mage for appropriateness for an Inpatient Acute Rehab Consult.  Noted PT/OT recommending CIR.   At this time, we are recommending Inpatient Rehab consult.  Lelon Frohlich M 02/03/2013, 10:04 AM  I can be reached at [phone].

## 2013-02-04 ENCOUNTER — Inpatient Hospital Stay (HOSPITAL_COMMUNITY)
Admission: RE | Admit: 2013-02-04 | Discharge: 2013-02-14 | DRG: 945 | Disposition: A | Discharge status: Home Health | Payer: Medicaid Other | Source: Intra-hospital | Attending: Physical Medicine & Rehabilitation | Admitting: Physical Medicine & Rehabilitation

## 2013-02-04 ENCOUNTER — Encounter (HOSPITAL_COMMUNITY): Payer: Self-pay | Admitting: Physical Medicine and Rehabilitation

## 2013-02-04 ENCOUNTER — Inpatient Hospital Stay (HOSPITAL_COMMUNITY): Payer: Medicaid Other

## 2013-02-04 DIAGNOSIS — I2510 Atherosclerotic heart disease of native coronary artery without angina pectoris: Secondary | ICD-10-CM

## 2013-02-04 DIAGNOSIS — R29898 Other symptoms and signs involving the musculoskeletal system: Secondary | ICD-10-CM | POA: Diagnosis present

## 2013-02-04 DIAGNOSIS — I214 Non-ST elevation (NSTEMI) myocardial infarction: Secondary | ICD-10-CM | POA: Diagnosis present

## 2013-02-04 DIAGNOSIS — Z951 Presence of aortocoronary bypass graft: Secondary | ICD-10-CM

## 2013-02-04 DIAGNOSIS — I6340 Cerebral infarction due to embolism of unspecified cerebral artery: Secondary | ICD-10-CM

## 2013-02-04 DIAGNOSIS — I309 Acute pericarditis, unspecified: Secondary | ICD-10-CM | POA: Diagnosis present

## 2013-02-04 DIAGNOSIS — I241 Dressler's syndrome: Secondary | ICD-10-CM | POA: Diagnosis present

## 2013-02-04 DIAGNOSIS — D62 Acute posthemorrhagic anemia: Secondary | ICD-10-CM | POA: Diagnosis present

## 2013-02-04 DIAGNOSIS — E785 Hyperlipidemia, unspecified: Secondary | ICD-10-CM | POA: Diagnosis present

## 2013-02-04 DIAGNOSIS — I253 Aneurysm of heart: Secondary | ICD-10-CM | POA: Diagnosis present

## 2013-02-04 DIAGNOSIS — A419 Sepsis, unspecified organism: Secondary | ICD-10-CM | POA: Diagnosis present

## 2013-02-04 DIAGNOSIS — D72829 Elevated white blood cell count, unspecified: Secondary | ICD-10-CM | POA: Diagnosis present

## 2013-02-04 DIAGNOSIS — I314 Cardiac tamponade: Secondary | ICD-10-CM | POA: Diagnosis present

## 2013-02-04 DIAGNOSIS — Z9861 Coronary angioplasty status: Secondary | ICD-10-CM

## 2013-02-04 DIAGNOSIS — I639 Cerebral infarction, unspecified: Secondary | ICD-10-CM

## 2013-02-04 DIAGNOSIS — I252 Old myocardial infarction: Secondary | ICD-10-CM

## 2013-02-04 DIAGNOSIS — R209 Unspecified disturbances of skin sensation: Secondary | ICD-10-CM | POA: Diagnosis present

## 2013-02-04 DIAGNOSIS — Z87891 Personal history of nicotine dependence: Secondary | ICD-10-CM

## 2013-02-04 DIAGNOSIS — Z5189 Encounter for other specified aftercare: Principal | ICD-10-CM

## 2013-02-04 LAB — BASIC METABOLIC PANEL
BUN: 13 mg/dL (ref 6–23)
CO2: 27 mEq/L (ref 19–32)
Chloride: 99 mEq/L (ref 96–112)
Creatinine, Ser: 0.76 mg/dL (ref 0.50–1.35)
Potassium: 3.6 mEq/L (ref 3.5–5.1)

## 2013-02-04 LAB — CBC
HCT: 27.6 % — ABNORMAL LOW (ref 39.0–52.0)
Hemoglobin: 9 g/dL — ABNORMAL LOW (ref 13.0–17.0)
MCV: 90.5 fL (ref 78.0–100.0)
Platelets: 372 10*3/uL (ref 150–400)
RBC: 3.05 MIL/uL — ABNORMAL LOW (ref 4.22–5.81)
WBC: 14.7 10*3/uL — ABNORMAL HIGH (ref 4.0–10.5)

## 2013-02-04 MED ORDER — BISACODYL 10 MG RE SUPP: 10.0000 mg | Freq: Every day | RECTAL | Status: DC | PRN

## 2013-02-04 MED ORDER — SIMVASTATIN 40 MG PO TABS
40.0000 mg | ORAL_TABLET | Freq: Every day | ORAL | Status: DC
  Administered 2013-02-04 – 2013-02-13 (×10): 40 mg via ORAL
  Filled 2013-02-04 (×11): qty 1

## 2013-02-04 MED ORDER — CLOPIDOGREL BISULFATE 75 MG PO TABS
75.0000 mg | ORAL_TABLET | Freq: Every day | ORAL | Status: DC
  Administered 2013-02-04: 75 mg via ORAL
  Filled 2013-02-04: qty 1

## 2013-02-04 MED ORDER — CALCIUM POLYCARBOPHIL 625 MG PO TABS
1250.0000 mg | ORAL_TABLET | Freq: Every day | ORAL | Status: DC
  Administered 2013-02-04 – 2013-02-12 (×9): 1250 mg via ORAL
  Filled 2013-02-04 (×10): qty 2

## 2013-02-04 MED ORDER — OXYCODONE HCL 5 MG PO TABS
5.0000 mg | ORAL_TABLET | ORAL | Status: DC | PRN
  Filled 2013-02-04: qty 1

## 2013-02-04 MED ORDER — PANTOPRAZOLE SODIUM 40 MG PO TBEC: 40.0000 mg | DELAYED_RELEASE_TABLET | Freq: Every day | ORAL | Status: DC

## 2013-02-04 MED ORDER — ENSURE COMPLETE PO LIQD
237.0000 mL | Freq: Three times a day (TID) | ORAL | Status: DC
  Administered 2013-02-04 – 2013-02-11 (×22): 237 mL via ORAL

## 2013-02-04 MED ORDER — SODIUM CHLORIDE 0.9 % IV SOLN: 250.0000 mL | INTRAVENOUS | Status: DC | PRN

## 2013-02-04 MED ORDER — SODIUM CHLORIDE 0.9 % IJ SOLN: 3.0000 mL | Freq: Two times a day (BID) | INTRAMUSCULAR | Status: DC

## 2013-02-04 MED ORDER — LOPERAMIDE HCL 1 MG/5ML PO LIQD: 1.0000 mg | ORAL | Status: DC | PRN

## 2013-02-04 MED ORDER — ASPIRIN 81 MG PO CHEW: 324.0000 mg | CHEWABLE_TABLET | Freq: Every day | ORAL | Status: DC

## 2013-02-04 MED ORDER — METOPROLOL TARTRATE 25 MG PO TABS: 25.0000 mg | ORAL_TABLET | Freq: Three times a day (TID) | ORAL | Status: DC

## 2013-02-04 MED ORDER — ASPIRIN EC 325 MG PO TBEC
325.0000 mg | DELAYED_RELEASE_TABLET | Freq: Every day | ORAL | Status: DC
  Administered 2013-02-05 – 2013-02-11 (×7): 325 mg via ORAL
  Filled 2013-02-04 (×8): qty 1

## 2013-02-04 MED ORDER — ALUM & MAG HYDROXIDE-SIMETH 200-200-20 MG/5ML PO SUSP: 15.0000 mL | ORAL | Status: DC | PRN

## 2013-02-04 MED ORDER — ONDANSETRON HCL 4 MG/2ML IJ SOLN: 4.0000 mg | Freq: Four times a day (QID) | INTRAMUSCULAR | Status: DC | PRN

## 2013-02-04 MED ORDER — TRAZODONE HCL 50 MG PO TABS: 25.0000 mg | ORAL_TABLET | Freq: Every evening | ORAL | Status: DC | PRN

## 2013-02-04 MED ORDER — MOVING RIGHT ALONG BOOK
Freq: Once | Status: AC
  Administered 2013-02-04: 11:00:00
  Filled 2013-02-04: qty 1

## 2013-02-04 MED ORDER — OXYCODONE HCL 5 MG PO TABS: 5.0000 mg | ORAL_TABLET | ORAL | Status: DC | PRN

## 2013-02-04 MED ORDER — SODIUM CHLORIDE 0.9 % IV SOLN: 500.0000 mg | Freq: Four times a day (QID) | INTRAVENOUS | Status: AC

## 2013-02-04 MED ORDER — ENOXAPARIN SODIUM 40 MG/0.4ML ~~LOC~~ SOLN
40.0000 mg | SUBCUTANEOUS | Status: DC
  Administered 2013-02-04 – 2013-02-13 (×10): 40 mg via SUBCUTANEOUS
  Filled 2013-02-04 (×11): qty 0.4

## 2013-02-04 MED ORDER — ALPRAZOLAM 0.25 MG PO TABS: 0.2500 mg | ORAL_TABLET | Freq: Four times a day (QID) | ORAL | Status: DC | PRN

## 2013-02-04 MED ORDER — ZOLPIDEM TARTRATE 5 MG PO TABS
5.0000 mg | ORAL_TABLET | Freq: Every evening | ORAL | Status: DC | PRN
  Administered 2013-02-07 – 2013-02-08 (×2): 5 mg via ORAL
  Filled 2013-02-04 (×2): qty 1

## 2013-02-04 MED ORDER — POTASSIUM CHLORIDE 10 MEQ/50ML IV SOLN
10.0000 meq | INTRAVENOUS | Status: DC
  Administered 2013-02-04 (×3): 10 meq via INTRAVENOUS

## 2013-02-04 MED ORDER — ASPIRIN 325 MG PO TBEC: 325.0000 mg | DELAYED_RELEASE_TABLET | Freq: Every day | ORAL | Status: DC

## 2013-02-04 MED ORDER — FLEET ENEMA 7-19 GM/118ML RE ENEM: 1.0000 | ENEMA | Freq: Once | RECTAL | Status: AC | PRN

## 2013-02-04 MED ORDER — ZOLPIDEM TARTRATE 5 MG PO TABS: 5.0000 mg | ORAL_TABLET | Freq: Every evening | ORAL | Status: DC | PRN

## 2013-02-04 MED ORDER — MAGNESIUM HYDROXIDE 400 MG/5ML PO SUSP: 30.0000 mL | Freq: Every day | ORAL | Status: DC | PRN

## 2013-02-04 MED ORDER — FUROSEMIDE 40 MG PO TABS
40.0000 mg | ORAL_TABLET | Freq: Two times a day (BID) | ORAL | Status: DC
  Administered 2013-02-04 – 2013-02-11 (×15): 40 mg via ORAL
  Filled 2013-02-04 (×18): qty 1

## 2013-02-04 MED ORDER — DIPHENHYDRAMINE HCL 12.5 MG/5ML PO ELIX: 12.5000 mg | ORAL_SOLUTION | Freq: Four times a day (QID) | ORAL | Status: DC | PRN

## 2013-02-04 MED ORDER — GUAIFENESIN-DM 100-10 MG/5ML PO SYRP: 15.0000 mL | ORAL_SOLUTION | ORAL | Status: DC | PRN

## 2013-02-04 MED ORDER — SACCHAROMYCES BOULARDII 250 MG PO CAPS
250.0000 mg | ORAL_CAPSULE | Freq: Two times a day (BID) | ORAL | Status: DC
  Administered 2013-02-04 – 2013-02-12 (×16): 250 mg via ORAL
  Filled 2013-02-04 (×19): qty 1

## 2013-02-04 MED ORDER — SODIUM CHLORIDE 0.9 % IJ SOLN: 3.0000 mL | INTRAMUSCULAR | Status: DC | PRN

## 2013-02-04 MED ORDER — ONDANSETRON HCL 4 MG PO TABS
4.0000 mg | ORAL_TABLET | Freq: Four times a day (QID) | ORAL | Status: DC | PRN
  Administered 2013-02-09 – 2013-02-11 (×2): 4 mg via ORAL
  Filled 2013-02-04 (×2): qty 1

## 2013-02-04 MED ORDER — POTASSIUM CHLORIDE CRYS ER 20 MEQ PO TBCR
20.0000 meq | EXTENDED_RELEASE_TABLET | Freq: Two times a day (BID) | ORAL | Status: DC
  Administered 2013-02-04 – 2013-02-11 (×14): 20 meq via ORAL
  Filled 2013-02-04 (×16): qty 1

## 2013-02-04 MED ORDER — LISINOPRIL 5 MG PO TABS
5.0000 mg | ORAL_TABLET | Freq: Every day | ORAL | Status: DC
  Administered 2013-02-04: 5 mg via ORAL
  Filled 2013-02-04: qty 1

## 2013-02-04 MED ORDER — ENSURE COMPLETE PO LIQD: 237.0000 mL | Freq: Three times a day (TID) | ORAL | Status: DC

## 2013-02-04 MED ORDER — PANTOPRAZOLE SODIUM 40 MG PO TBEC
40.0000 mg | DELAYED_RELEASE_TABLET | Freq: Every day | ORAL | Status: DC
  Administered 2013-02-05 – 2013-02-12 (×8): 40 mg via ORAL
  Filled 2013-02-04 (×8): qty 1

## 2013-02-04 MED ORDER — SIMVASTATIN 40 MG PO TABS
40.0000 mg | ORAL_TABLET | Freq: Every day | ORAL | Status: DC
  Filled 2013-02-04: qty 1

## 2013-02-04 MED ORDER — POTASSIUM CHLORIDE CRYS ER 20 MEQ PO TBCR: 20.0000 meq | EXTENDED_RELEASE_TABLET | Freq: Two times a day (BID) | ORAL | Status: DC

## 2013-02-04 MED ORDER — METOPROLOL TARTRATE 25 MG PO TABS
25.0000 mg | ORAL_TABLET | Freq: Three times a day (TID) | ORAL | Status: DC
  Administered 2013-02-04 – 2013-02-11 (×19): 25 mg via ORAL
  Filled 2013-02-04 (×26): qty 1

## 2013-02-04 MED ORDER — SODIUM CHLORIDE 0.9 % IV SOLN
500.0000 mg | Freq: Four times a day (QID) | INTRAVENOUS | Status: AC
  Administered 2013-02-04 – 2013-02-11 (×29): 500 mg via INTRAVENOUS
  Filled 2013-02-04 (×29): qty 500

## 2013-02-04 MED ORDER — ACETAMINOPHEN 325 MG PO TABS: 325.0000 mg | ORAL_TABLET | ORAL | Status: DC | PRN

## 2013-02-04 MED ORDER — SODIUM CHLORIDE 0.9 % IV SOLN: 500.0000 mg | Freq: Four times a day (QID) | INTRAVENOUS | Status: DC

## 2013-02-04 MED ORDER — LISINOPRIL 5 MG PO TABS
5.0000 mg | ORAL_TABLET | Freq: Every day | ORAL | Status: DC
  Administered 2013-02-05 – 2013-02-11 (×6): 5 mg via ORAL
  Filled 2013-02-04 (×9): qty 1

## 2013-02-04 MED ORDER — FUROSEMIDE 40 MG PO TABS: 40.0000 mg | ORAL_TABLET | Freq: Two times a day (BID) | ORAL | Status: DC

## 2013-02-04 MED ORDER — POLYETHYLENE GLYCOL 3350 17 G PO PACK
17.0000 g | PACK | Freq: Every day | ORAL | Status: DC | PRN
  Filled 2013-02-04: qty 1

## 2013-02-04 MED ORDER — CLOPIDOGREL BISULFATE 75 MG PO TABS
75.0000 mg | ORAL_TABLET | Freq: Every day | ORAL | Status: DC
  Administered 2013-02-05 – 2013-02-14 (×10): 75 mg via ORAL
  Filled 2013-02-04 (×11): qty 1

## 2013-02-04 NOTE — Progress Notes (Signed)
I met with patient at bedside and discussed admission to inpt rehab today. Patient is in agreement and I will arrange. I contacted his fiance and she is in agreement. [phone]

## 2013-02-04 NOTE — Progress Notes (Signed)
7 Days Post-Op Procedure(s) (LRB): CORONARY ARTERY BYPASS GRAFTING (CABG) (N/A) INTRAOPERATIVE TRANSESOPHAGEAL ECHOCARDIOGRAM (N/A) LEFT VENTRICULAR ANEURYSM REPAIR (N/A) Subjective: No complaints this morning  Objective: Vital signs in last 24 hours: Temp:  [98.4 F (36.9 C)-99.2 F (37.3 C)] 98.6 F (37 C) (10/22 0700) Pulse Rate:  [101-113] 109 (10/22 0800) Cardiac Rhythm:  [-] Sinus tachycardia (10/22 0800) Resp:  [2-38] 25 (10/22 0800) BP: (95-121)/(57-82) 121/72 mmHg (10/22 0800) SpO2:  [89 %-97 %] 93 % (10/22 0800) Weight:  [225 lb 8 oz (102.286 kg)] 225 lb 8 oz (102.286 kg) (10/22 0500)  Hemodynamic parameters for last 24 hours:    Intake/Output from previous day: 10/21 0701 - 10/22 0700 In: 1120 [P.O.:240; I.V.:380; IV Piggyback:500] Out: 2825 [Urine:2825] Intake/Output this shift: Total I/O In: 190 [P.O.:120; I.V.:20; IV Piggyback:50] Out: 150 [Urine:150]  General appearance: alert and no distress Neurologic: left sided weakness and can abduct left arm slightly Heart: tachy regular Lungs: clear to auscultation bilaterally Wound: clean and dry  Lab Results:  Recent Labs  02/03/13 0420 02/04/13 0420  WBC 16.6* 14.7*  HGB 9.1* 9.0*  HCT 28.2* 27.6*  PLT 364 372   BMET:  Recent Labs  02/03/13 0420 02/04/13 0420  NA 138 136  K 3.5 3.6  CL 99 99  CO2 27 27  GLUCOSE 106* 105*  BUN 14 13  CREATININE 0.78 0.76  CALCIUM 8.4 8.5    PT/INR: No results found for this basename: LABPROT, INR,  in the last 72 hours ABG    Component Value Date/Time   PHART 7.516* 01/30/2013 1103   HCO3 29.3* 01/30/2013 1103   TCO2 30 01/30/2013 1103   ACIDBASEDEF 4.0* 01/28/2013 2112   O2SAT 93.0 01/30/2013 1103   CBG (last 3)   Recent Labs  02/02/13 0750  GLUCAP 120*    Assessment/Plan: S/P Procedure(s) (LRB): CORONARY ARTERY BYPASS GRAFTING (CABG) (N/A) INTRAOPERATIVE TRANSESOPHAGEAL ECHOCARDIOGRAM (N/A) LEFT VENTRICULAR ANEURYSM REPAIR (N/A) Plan for  transfer to step-down: see transfer orders  Doing well  Primary issue at this point is rehab from stroke- continue PT/OT/Speech  From my standpoint he is medically ready to go to rehab whenever they are ready to take him  Will complete 2 weeks of primaxin per ID- intraop cultures were negative   LOS: 14 days    HENDRICKSON,STEVEN C 02/04/2013

## 2013-02-04 NOTE — H&P (Signed)
Physical Medicine and Rehabilitation Admission H&P  Chief Complaint   Patient presents with   .  Left sided weakness and visual   HPI: [name] is a 52 y.o. male with history of CAD and recent NSTEMI 01/05/13 treated medically. He was readmitted on 01/31/13 with recurrent chest pain due to acute pericarditis and cardiac tamponade (Dressler syndrome). He underwent pericardiocentesis 10/11 with improvement in symptoms and drain removed 10/14. On 10/15, he developed LUE sensory loss with left hand weakness and ataxia,left visual deficits. He was noted to be hypotensive, hypoxic with fever and chills. He was intubated and started on broad spectrum antibiotics for sepsis and CT head negative. Repeat echo showed a complex effusion with RV collapse and tamponade. He was taken to OR the same day for CABG X1 with repair of LV ruptured inferoposterior pseudoaneurysm by Dr. Dorris Fetch.  Dr. Luciana Axe and Dr. Pearlean Brownie consulted for input. CTA head/neck with New acute/ subacute nonhemorrhagic infarcts involving the left superior cerebellum, the posterior and medial left parietal lobe and lateral left occipital lobe, the higher left frontal lobe, the posterior right frontal lobe and corona radiata. He was extubated without difficulty. Neurology feels that patient with embolic stroke on unknown source but suspected to be due to sepsis. Antibiotics narrowed to imipenem with recommendations for 14 total days antibiotics--stop date 10/29.  Swallow evaluation done yesterday and patient started on regular diet. Therapies ongoing and CIR recommended by rehab team and MD.    Review of Systems  HENT: Negative for hearing loss.  Eyes: Positive for blurred vision.  Respiratory: Negative for cough and shortness of breath.  Cardiovascular: Positive for chest pain (minimal chest wall pain).  Gastrointestinal: Positive for diarrhea (loose stools once a day). Negative for heartburn and nausea.  Genitourinary: Negative for  dysuria and urgency.  Musculoskeletal: Negative for back pain, myalgias and neck pain.  Neurological: Positive for speech change and focal weakness. Negative for headaches.  Psychiatric/Behavioral: Negative for depression. The patient is not nervous/anxious.   Past Medical History   Diagnosis  Date   .  Coronary artery disease      a. s/p MI in 2009 in [place] with stenting of the LCX and LAD; b. 12/2012 NSTEMI/CAD: LM nl, LAD patent prox stent, LCX 50-70 isr (FFR 0.84), RCA dom, 178m, EF 40-45%, sev basal to mid inf HK to AK-->Med Rx.   .  Tobacco abuse      a. quit 12/2012.   .  Ischemic cardiomyopathy      a. 12/2012 Ech: EF 40-45%, Gr 1 DD.   Marland Kitchen  Hyperlipidemia     Past Surgical History   Procedure  Laterality  Date   .  Coronary angioplasty with stent placement   2000's X 2     "1 + 1" (01/06/2013)   .  Cardiac catheterization   01/06/2013   .  Coronary artery bypass graft  N/A  01/28/2013     Procedure: CORONARY ARTERY BYPASS GRAFTING (CABG); Surgeon: Loreli Slot, MD; Location: [facility] OR; Service: Open Heart Surgery; Laterality: N/A; CABG x one, using left greater saphenous vein harvested endoscopically   .  Intraoperative transesophageal echocardiogram  N/A  01/28/2013     Procedure: INTRAOPERATIVE TRANSESOPHAGEAL ECHOCARDIOGRAM; Surgeon: Loreli Slot, MD; Location: [facility] OR; Service: Open Heart Surgery; Laterality: N/A;   .  Ventricular aneurysm resection  N/A  01/28/2013     Procedure: LEFT VENTRICULAR ANEURYSM REPAIR; Surgeon: Loreli Slot, MD; Location: [facility] OR; Service: Open Heart Surgery;  Laterality: N/A;    Family History   Problem  Relation  Age of Onset   .  CAD      Social History: Lives with adult step daughter (currently unemployed). Was working full time PTA. He reports that he quit smoking about 4 weeks ago. His smoking use included Cigarettes. He has a 35 pack-year smoking history. He has never used smokeless tobacco. He reports that he does not drink alcohol  or use illicit drugs.  Allergies: No Known Allergies  Medications Prior to Admission   Medication  Sig  Dispense  Refill   .  aspirin EC 81 MG EC tablet  Take 1 tablet (81 mg total) by mouth daily.  30 tablet  0   .  clopidogrel (PLAVIX) 75 MG tablet  Take 1 tablet (75 mg total) by mouth daily.  30 tablet  11   .  lisinopril (PRINIVIL,ZESTRIL) 5 MG tablet  Take 1 tablet (5 mg total) by mouth daily.  30 tablet  11   .  metoprolol tartrate (LOPRESSOR) 25 MG tablet  Take 1 tablet (25 mg total) by mouth 2 (two) times daily.  60 tablet  11   .  nitroGLYCERIN (NITROSTAT) 0.4 MG SL tablet  Place 1 tablet (0.4 mg total) under the tongue every 5 (five) minutes as needed for chest pain.  25 tablet  3   .  simvastatin (ZOCOR) 40 MG tablet  Take 1 tablet (40 mg total) by mouth at bedtime.  30 tablet  11   Home:  Home Living  Family/patient expects to be discharged to:: Inpatient rehab  Living Arrangements: Children  Available Help at Discharge: Family;Available 24 hours/day  Type of Home: Apartment  Home Access: Stairs to enter  Entrance Stairs-Number of Steps: 3  Entrance Stairs-Rails: None  Home Layout: One level  Home Equipment: None  Additional Comments: Pt reports he has tub/shower combo with curtain and standard commode  Functional History:  Prior Function  Comments: works at [employer] Status:  Mobility:  Bed Mobility  Bed Mobility: Not assessed  Sit to Supine: 3: Mod assist  Transfers  Transfers: Sit to Stand;Stand to Sit  Sit to Stand: 3: Mod assist  Sit to Stand: Patient Percentage: 50%  Stand to Sit: 4: Min assist  Stand to Sit: Patient Percentage: 60%  Ambulation/Gait  Ambulation/Gait Assistance: 3: Mod assist  Ambulation Distance (Feet): 50 Feet  Assistive device: 1 person hand held assist  Ambulation/Gait Assistance Details: pt guarded, hesitant. unable to use walker due to inability to grip with L UE. v/c's to increase step length. limited by onset of  dizziness  Gait Pattern: Decreased stride length;Step-through pattern;Wide base of support;Decreased stance time - left  Gait velocity: slow  Stairs: No   ADL:  ADL  Eating/Feeding: Moderate assistance (drink from cup with Lt. UE)  Where Assessed - Eating/Feeding: Chair  Grooming: Wash/dry hands;Wash/dry face;Brushing hair;Supervision/safety  Where Assessed - Grooming: Supine, head of bed up  Upper Body Bathing: Maximal assistance  Where Assessed - Upper Body Bathing: Supine, head of bed up  Lower Body Bathing: +1 Total assistance  Where Assessed - Lower Body Bathing: Supine, head of bed up;Rolling right and/or left  Upper Body Dressing: +1 Total assistance  Where Assessed - Upper Body Dressing: Supine, head of bed up;Unsupported sitting  Lower Body Dressing: +1 Total assistance  Where Assessed - Lower Body Dressing: Supine, head of bed up;Rolling right and/or left;Supported sit to stand  Toilet Transfer: +1  Total assistance (too fatigued to attempt)  Transfers/Ambulation Related to ADLs: Pt did not attempt due to fatigue this pm  ADL Comments: Worked on facilitation of reach Lt. UE in forward flexion and scaption. Able to reach for and retrieve cup with Lt. UE and mod facilitation at ~50* of shoulder flexion, bring it to mouth to drink, and give it to therapist (mod facilitation)  Cognition:  Cognition  Overall Cognitive Status: Impaired/Different from baseline  Orientation Level: Oriented X4  Cognition  Arousal/Alertness: Awake/alert  Behavior During Therapy: Flat affect  Overall Cognitive Status: Impaired/Different from baseline  Area of Impairment: Safety/judgement;Following commands;Problem solving  Orientation Level: Time;Situation  Current Attention Level: Selective  Memory: Decreased recall of precautions  Following Commands: Follows one step commands consistently  Awareness: Intellectual  Problem Solving: Slow processing;Requires verbal cues  General Comments: freq  verbal cues to complete task, increased time  Physical Exam:  Blood pressure 121/72, pulse 109, temperature 98.6 F (37 C), temperature source Oral, resp. rate 25, height 6\' 1"  (1.854 m), weight 102.286 kg (225 lb 8 oz), SpO2 93.00%.  Physical Exam  Nursing note and vitals reviewed.  Constitutional: He is oriented to person, place, and time. He appears well-developed and well-nourished.  HENT:  Head: Normocephalic and atraumatic.  Mouth/Throat: Oropharynx is clear and moist.  Eyes: Conjunctivae are normal. Pupils are equal, round, and reactive to light.  Neck: Normal range of motion. Neck supple.  Cardiovascular: Normal rate and regular rhythm.  No murmur heard.  Respiratory: Effort normal and breath sounds normal. No respiratory distress. He has no wheezes.  GI: Soft. Bowel sounds are normal. He exhibits no distension. There is no tenderness.  Musculoskeletal:  Small incision left thigh (graft) clean and dry. 1+ edema LLE.  Neurological: He is alert and oriented to person, place, and time.  Mild left facial droop. Speech clear. Follows commands without difficulty. Cognitively appropriate. Left deltoid trace, 1/5 at bicep and tricep. LLE is 2/5 HF, 3/5 KE, 3-4/5 at ADF, APF  Skin: Skin is warm and dry.  Sternal incision clean and dry--healing well.   Results for orders placed during the hospital encounter of 01/21/13 (from the past 48 hour(s))   CLOSTRIDIUM DIFFICILE BY PCR Status: None    Collection Time    02/03/13 6:05 AM   Result  Value  Range    C difficile by pcr  NEGATIVE  NEGATIVE   CBC Status: Abnormal    Collection Time    02/04/13 4:20 AM   Result  Value  Range    WBC  14.7 (*)  4.0 - 10.5 K/uL    RBC  3.05 (*)  4.22 - 5.81 MIL/uL    Hemoglobin  9.0 (*)  13.0 - 17.0 g/dL    HCT  16.1 (*)  09.6 - 52.0 %    MCV  90.5  78.0 - 100.0 fL    MCH  29.5  26.0 - 34.0 pg    MCHC  32.6  30.0 - 36.0 g/dL    RDW  04.5 (*)  40.9 - 15.5 %    Platelets  372  150 - 400 K/uL   BASIC  METABOLIC PANEL Status: Abnormal    Collection Time    02/04/13 4:20 AM   Result  Value  Range    Sodium  136  135 - 145 mEq/L    Potassium  3.6  3.5 - 5.1 mEq/L    Chloride  99  96 - 112 mEq/L  CO2  27  19 - 32 mEq/L    Glucose, Bld  105 (*)  70 - 99 mg/dL    BUN  13  6 - 23 mg/dL    Creatinine, Ser  1.61  0.50 - 1.35 mg/dL    Calcium  8.5  8.4 - 10.5 mg/dL    GFR calc non Af Amer  >90  >90 mL/min    GFR calc Af Amer  >90  >90 mL/min    Comment:  (NOTE)     The eGFR has been calculated using the CKD EPI equation.     This calculation has not been validated in all clinical situations.     eGFR's persistently <90 mL/min signify possible Chronic Kidney     Disease.   Ct Angio Head W/cm &/or Wo Cm  02/02/2013 CLINICAL DATA: Left arm weakness and numbness. Left visual field defects. EXAM: CT ANGIOGRAPHY HEAD AND NECK TECHNIQUE: Multidetector CT imaging of the head and neck was performed using the standard protocol during bolus administration of intravenous contrast. Multiplanar CT image reconstructions including MIPs were obtained to evaluate the vascular anatomy. Carotid stenosis measurements (when applicable) are obtained utilizing NASCET criteria, using the distal internal carotid diameter as the denominator. CONTRAST: 80mL OMNIPAQUE IOHEXOL 350 MG/ML SOLN COMPARISON: CT head without contrast 01/28/2013. FINDINGS: CTA HEAD FINDINGS Multiple new nonhemorrhagic infarcts involve the left superior cerebellum, the posterior left parietal, the lateral left occipital cortex, and the posterior right frontal lobe. An additional area of acute nonhemorrhagic infarction is present in the anterior left frontal lobe on image 19 of series 2. A sub cm white matter infarct is present in the right coronal radiata on image 19 as well. The postcontrast images and exhibits same areas of the infarct. No pathologic enhancement is present. Ventricles are of normal size. No significant extra-axial fluid collection is  present. The paranasal sinuses and mastoid air cells are clear. The osseous skull is intact. The A1 and M1 segments are normal. The left anterior cerebral artery is fenestrated near the anti communicating artery. The aging communicating artery is patent. The ACA and MCA branch vessels are within normal limits. The right vertebral artery is slightly dominant to the left. The PICA origins are visualized and within normal limits bilaterally. The basilar artery is normal. The right posterior cerebral artery is of fetal type. The left posterior cerebral artery originates from the basilar tip and the left posterior communicating artery. The source images demonstrate no additional infarcts. More remote lacunar infarcts are present within the left cerebellum. Review of the MIP images confirms the above findings. CTA NECK FINDINGS A standard 3 vessel arch configuration is present. Both vertebral arteries originate from the subclavian arteries. The right vertebral artery is the dominant vessel. There is no significant stenoses or significant caliber change of the vertebral arteries within the cervical vertebral arteries. The right common carotid artery is within normal limits. The bifurcation is unremarkable. Cervical right internal carotid artery is or bulb. The left common carotid artery is within normal limits. The bifurcation is unremarkable. The cervical ICA is normal. The soft tissues of the neck are unremarkable. The thyroid is within normal limits. The patient is status post recent median sternotomy. Bilateral pleural effusions and associated atelectasis remaining. There is no significant cervical adenopathy. Review of the MIP images confirms the above findings. IMPRESSION: CTA HEAD IMPRESSION 1. New acute/ subacute nonhemorrhagic infarcts involving the left superior cerebellum, the posterior and medial left parietal lobe and lateral left occipital lobe,  the higher left frontal lobe, the posterior right frontal lobe  and corona radiata. 2. No significant focal stenosis, aneurysm, or branch vessel occlusion. 3. More remote lacunar infarcts involve the left cerebellum. CTA NECK IMPRESSION 1. No significant cervical stenoses. Normal appearance of the cervical vasculature. 2. Bilateral pleural effusions and associated atelectasis. 3. Status post recent median sternotomy. Electronically Signed By: Gennette Pac M.D. On: 02/02/2013 13:54  Dg Chest Port 1 View  02/04/2013 CLINICAL DATA: Post CABG, repair of left ventricular pseudoaneurysm EXAM: PORTABLE CHEST - 1 VIEW COMPARISON: Portable chest x-ray of 02/03/2013 FINDINGS: Moderate cardiomegaly is stable with perhaps minimal pulmonary vascular congestion and basilar volume loss. Small effusions cannot be excluded. A right IJ central venous line is unchanged in position overlying the lower SVC. Median sternotomy sutures are noted. IMPRESSION: Stable cardiomegaly. Minimal basilar atelectasis with questionable mild pulmonary vascular congestion. Electronically Signed By: Dwyane Dee M.D. On: 02/04/2013 09:14  Post Admission Physician Evaluation:  1. Functional deficits secondary to bicerebral likely embolic infarcts with primarily left sided symptoms. 2. Patient is admitted to receive collaborative, interdisciplinary care between the physiatrist, rehab nursing staff, and therapy team. 3. Patient's level of medical complexity and substantial therapy needs in context of that medical necessity cannot be provided at a lesser intensity of care such as a SNF. 4. Patient has experienced substantial functional loss from his/her baseline which was documented above under the "Functional History" and "Functional Status" headings. Judging by the patient's diagnosis, physical exam, and functional history, the patient has potential for functional progress which will result in measurable gains while on inpatient rehab. These gains will be of substantial and practical use upon discharge in  facilitating mobility and self-care at the household level 5. Physiatrist will provide 24 hour management of medical needs as well as oversight of the therapy plan/treatment and provide guidance as appropriate regarding the interaction of the two. 6. 24 hour rehab nursing will assist with bladder management, bowel management, safety, skin/wound care, disease management, medication administration, pain management and patient education and help integrate therapy concepts, techniques,education, etc. 7. PT will assess and treat for/with: Lower extremity strength, range of motion, stamina, balance, functional mobility, safety, adaptive techniques and equipment, NMR, education, cardiac precautions. Goals are: supervision to min assist 8. OT will assess and treat for/with: ADL's, functional mobility, safety, upper extremity strength, adaptive techniques and equipment, NMR, sternal precautions. Goals are: supervision to min assist. 9. SLP will assess and treat for/with: n/a. Goals are: n/a. 10. Case Management and Social Worker will assess and treat for psychological issues and discharge planning. 11. Team conference will be held weekly to assess progress toward goals and to determine barriers to discharge. 12. Patient will receive at least 3 hours of therapy per day at least 5 days per week. 13. ELOS: 22-24 days  14. Prognosis: excellent Medical Problem List and Plan:  1. DVT Prophylaxis/Anticoagulation: Pharmaceutical: Lovenox  2. Pain Management: prn medications effective.  3. Mood: Provide ego support--has supportive girlfriend. Will have LCSW follow for evaluation.  4. Neuropsych: This patient is capable of making decisions on his own behalf.  5. Pericarditis: Continue imipenem through 02/11/13. Leucocytosis resolving.  6. Antibiotic associated diarrhea: C-diff check 10/21 was negative. Will add probiotics and fiber to help with symptoms.  7. ABLA: Add iron supplement  8. CAD/s/p CABG X 1: sternal  precautions. Continue ASA/plavix, lisinopril, zocor and metoprolol   Ranelle Oyster, MD, [facility] Physical Medicine & Rehabilitation   02/04/2013

## 2013-02-04 NOTE — Progress Notes (Signed)
Occupational Therapy Treatment Patient Details Name: [name] MRN: [id number] DOB: December 14, 1960 Today's Date: 02/04/2013 Time: 4098-1191 OT Time Calculation (min): 17 min  OT Assessment / Plan / Recommendation  History of present illness [name] is an 52 y.o. male with a history of CAD s/p NSTEMI on 9/23 and admission 10/8 with recurrent chest pain secondary to acute pericarditis and cardiac tamponade (Dressler syndrome). Patient had a drain placed that was removed on the 14th due to decreased drainage. Pt then developed inability to use his left hand and difficulty seeing to the left as well. He was noted to be hypotensive, hypoxic with fever and chills. He was intubated and started on broad spectrum antibiotics for sepsis and CT head negative. Repeat echo showed a complex effusion with RV collapse and tamponade. He was taken to OR the same day for CABG X1 with repair of LV ruptured inferoposterior pseudoaneurysm by Dr. Dorris Fetch. CTA head/neck with New acute/ subacute nonhemorrhagic infarcts involving the left superior cerebellum, the posterior and medial left parietal lobe and lateral left occipital lobe, the higher left frontal lobe, the posterior right frontal lobe and corona radiata. He was extubated without difficulty. Neurology feels that patient with embolic stroke on unknown source but suspected to be due to sepsis.    OT comments  Pt continues to demonstrate improving Lt. UE function.  Appears to have motor planning deficits.  Pt for transfer to rehab today  Follow Up Recommendations  CIR;Supervision/Assistance - 24 hour    Barriers to Discharge       Equipment Recommendations  3 in 1 bedside comode;Tub/shower bench    Recommendations for Other Services    Frequency Min 3X/week   Progress towards OT Goals Progress towards OT goals: Progressing toward goals  Plan Discharge plan remains appropriate    Precautions / Restrictions Precautions Precautions:  Fall;Sternal   Pertinent Vitals/Pain     ADL  Grooming: Wash/dry face;Moderate assistance (Lt UE) Where Assessed - Grooming: Supine, head of bed up ADL Comments: Worked on facilitation of reach with Lt. UE - min - mod A/facilitation.  Pt able to drink from cup with min - mod facilitation/assist and wash face with min-mod A/faciliation with Lt. UE.    OT Diagnosis:    OT Problem List:   OT Treatment Interventions:     OT Goals(current goals can now be found in the care plan section) ADL Goals Pt Will Perform Upper Body Bathing: with supervision;sitting Pt Will Perform Lower Body Bathing: with min assist;sit to/from stand Pt Will Perform Upper Body Dressing: with min assist;sitting Pt Will Perform Lower Body Dressing: with mod assist;sit to/from stand Pt Will Transfer to Toilet: with min assist;ambulating;regular height toilet;bedside commode;grab bars Pt Will Perform Toileting - Clothing Manipulation and hygiene: with min assist;sit to/from stand Pt/caregiver will Perform Home Exercise Program: Increased ROM;Increased strength;Left upper extremity;With minimal assist Additional ADL Goal #1: Pt will be oriented x 4 with no use of external cues Additional ADL Goal #2: Pt will be independent with sternal precautions during all ADL activities Additional ADL Goal #3: Pt will use Lt. UE as an active assist with min faciliation   Visit Information  Last OT Received On: 02/04/13 Assistance Needed: +1 History of Present Illness: [name] is an 52 y.o. male with a history of CAD s/p NSTEMI on 9/23 and admission 10/8 with recurrent chest pain secondary to acute pericarditis and cardiac tamponade (Dressler syndrome). Patient had a drain placed that was removed on the 14th  due to decreased drainage. Pt then developed inability to use his left hand and difficulty seeing to the left as well. He was noted to be hypotensive, hypoxic with fever and chills. He was intubated and started on broad  spectrum antibiotics for sepsis and CT head negative. Repeat echo showed a complex effusion with RV collapse and tamponade. He was taken to OR the same day for CABG X1 with repair of LV ruptured inferoposterior pseudoaneurysm by Dr. Dorris Fetch. CTA head/neck with New acute/ subacute nonhemorrhagic infarcts involving the left superior cerebellum, the posterior and medial left parietal lobe and lateral left occipital lobe, the higher left frontal lobe, the posterior right frontal lobe and corona radiata. He was extubated without difficulty. Neurology feels that patient with embolic stroke on unknown source but suspected to be due to sepsis.     Subjective Data      Prior Functioning       Cognition  Cognition Arousal/Alertness: Awake/alert Behavior During Therapy: WFL for tasks assessed/performed Overall Cognitive Status: Impaired/Different from baseline Area of Impairment: Memory Current Attention Level: Selective Memory: Decreased recall of precautions Following Commands: Follows one step commands consistently Awareness: Intellectual Problem Solving: Slow processing;Requires verbal cues General Comments: Pt with difficulty recalling when he is going to rehab despite reminders    Mobility  Bed Mobility Bed Mobility: Rolling Left;Left Sidelying to Sit;Sitting - Scoot to [place] of Bed Rolling Left: 4: Min assist Left Sidelying to Sit: 3: Mod assist Sitting - Scoot to Edge of Bed: 5: Supervision Details for Bed Mobility Assistance: difficulty raising torso due to weakness (especially LUE) Transfers Sit to Stand: 4: Min assist Stand to Sit: 4: Min guard Details for Transfer Assistance: assist for anterior weight-shift over his base of support and slight asist for vertical/extension; transfer x 4    Exercises  General Exercises - Upper Extremity Shoulder Flexion: AAROM;Left;10 reps;Supine Shoulder Extension: AAROM;Left;10 reps;Supine Elbow Flexion: AAROM;10 reps;Left;Supine Elbow  Extension: AAROM;Right;10 reps;Supine General Exercises - Lower Extremity Heel Slides: AROM;Left;5 reps;Supine Mini-Sqauts: AROM;Both;5 reps;Standing Other Exercises Other Exercises: forward and backward stepping with LLE Other Exercises: bridging--supine; initially with bil LEs progressing to LLE only   Balance Static Sitting Balance Static Sitting - Balance Support: No upper extremity supported;Feet supported Static Sitting - Level of Assistance: 7: Independent Static Standing Balance Static Standing - Balance Support: No upper extremity supported Static Standing - Level of Assistance: 5: Stand by assistance Dynamic Standing Balance Dynamic Standing - Balance Support: No upper extremity supported;During functional activity (pt performed pericare in standing) Dynamic Standing - Level of Assistance: 4: Min assist   End of Session OT - End of Session Activity Tolerance: Patient tolerated treatment well Patient left: in bed;with call bell/phone within reach;with family/visitor present  GO     Hajer Dwyer, Ursula Alert M 02/04/2013, 1:34 PM

## 2013-02-04 NOTE — Progress Notes (Signed)
Physical Therapy Treatment Patient Details Name: [name] MRN: [id number] DOB: Jun 04, 1960 Today's Date: 02/04/2013 Time: 4098-1191 PT Time Calculation (min): 54 min  PT Assessment / Plan / Recommendation  History of Present Illness [name] is an 52 y.o. male with a history of CAD s/p NSTEMI on 9/23 and admission 10/8 with recurrent chest pain secondary to acute pericarditis and cardiac tamponade (Dressler syndrome). Patient had a drain placed that was removed on the 14th due to decreased drainage. Pt then developed inability to use his left hand and difficulty seeing to the left as well. He was noted to be hypotensive, hypoxic with fever and chills. He was intubated and started on broad spectrum antibiotics for sepsis and CT head negative. Repeat echo showed a complex effusion with RV collapse and tamponade. He was taken to OR the same day for CABG X1 with repair of LV ruptured inferoposterior pseudoaneurysm by Dr. Dorris Fetch. CTA head/neck with New acute/ subacute nonhemorrhagic infarcts involving the left superior cerebellum, the posterior and medial left parietal lobe and lateral left occipital lobe, the higher left frontal lobe, the posterior right frontal lobe and corona radiata. He was extubated without difficulty. Neurology feels that patient with embolic stroke on unknown source but suspected to be due to sepsis.    PT Comments   Pt continues with flat affect and delayed responses. Ambulating better, however limited by dizziness that seems related to Lt cerebellar CVA. Instructed in visual compensation with decr dizziness reported. Pt requires encouragement to push himself to do more/try new things (i.e. using the toilet instead of the [facility], performing pericare himself).   Follow Up Recommendations  CIR     Does the patient have the potential to tolerate intense rehabilitation     Barriers to Discharge        Equipment Recommendations  Other (comment) (TBA)     Recommendations for Other Services    Frequency Min 4X/week   Progress towards PT Goals Progress towards PT goals: Progressing toward goals  Plan Current plan remains appropriate    Precautions / Restrictions Precautions Precautions: Fall;Sternal   Pertinent Vitals/Pain HR 101-122 with ambulation Denied pain Dizziness 7.5/10 down to 5/10 by end of session    Mobility  Bed Mobility Bed Mobility: Rolling Left;Left Sidelying to Sit;Sitting - Scoot to [place] of Bed Rolling Left: 4: Min assist Left Sidelying to Sit: 3: Mod assist Sitting - Scoot to Edge of Bed: 5: Supervision Details for Bed Mobility Assistance: difficulty raising torso due to weakness (especially LUE) Transfers Transfers: Sit to Stand;Stand to Sit Sit to Stand: 4: Min assist Stand to Sit: 4: Min guard Details for Transfer Assistance: assist for anterior weight-shift over his base of support and slight asist for vertical/extension; transfer x 4 Ambulation/Gait Ambulation/Gait Assistance: 4: Min assist Ambulation Distance (Feet): 70 Feet Assistive device: Other (Comment) (pt holding IV pole in Rt hand) Ambulation/Gait Assistance Details: Pt reports dizziness increases with movement. Educated on visual comopensation to fix his gaze on an object (used handle on IV pole). He was able to decr dizziness from a 7.5 to 5 out of 10 Gait Pattern: Decreased stride length;Step-through pattern;Wide base of support;Decreased stance time - left Gait velocity: slow Modified Rankin (Stroke Patients Only) Pre-Morbid Rankin Score: No symptoms Modified Rankin: Moderately severe disability    Exercises General Exercises - Lower Extremity Heel Slides: AROM;Left;5 reps;Supine Mini-Sqauts: AROM;Both;5 reps;Standing Other Exercises Other Exercises: forward and backward stepping with LLE Other Exercises: bridging--supine; initially with bil LEs progressing to  LLE only   PT Diagnosis:    PT Problem List:   PT Treatment Interventions:      PT Goals (current goals can now be found in the care plan section)    Visit Information  Last PT Received On: 02/04/13 Assistance Needed: +1 History of Present Illness: [name] is an 52 y.o. male with a history of CAD s/p NSTEMI on 9/23 and admission 10/8 with recurrent chest pain secondary to acute pericarditis and cardiac tamponade (Dressler syndrome). Patient had a drain placed that was removed on the 14th due to decreased drainage. Pt then developed inability to use his left hand and difficulty seeing to the left as well. He was noted to be hypotensive, hypoxic with fever and chills. He was intubated and started on broad spectrum antibiotics for sepsis and CT head negative. Repeat echo showed a complex effusion with RV collapse and tamponade. He was taken to OR the same day for CABG X1 with repair of LV ruptured inferoposterior pseudoaneurysm by Dr. Dorris Fetch. CTA head/neck with New acute/ subacute nonhemorrhagic infarcts involving the left superior cerebellum, the posterior and medial left parietal lobe and lateral left occipital lobe, the higher left frontal lobe, the posterior right frontal lobe and corona radiata. He was extubated without difficulty. Neurology feels that patient with embolic stroke on unknown source but suspected to be due to sepsis.     Subjective Data  Subjective: reports he feels dizzy with movement   Cognition  Cognition Arousal/Alertness: Awake/alert Behavior During Therapy: Flat affect Overall Cognitive Status: Impaired/Different from baseline Area of Impairment: Problem solving Current Attention Level: Selective Memory: Decreased recall of precautions Following Commands: Follows one step commands with increased time Problem Solving: Slow processing;Requires verbal cues    Balance  Static Sitting Balance Static Sitting - Balance Support: No upper extremity supported;Feet supported Static Sitting - Level of Assistance: 7: Independent Static  Standing Balance Static Standing - Balance Support: No upper extremity supported Static Standing - Level of Assistance: 5: Stand by assistance Dynamic Standing Balance Dynamic Standing - Balance Support: No upper extremity supported;During functional activity (pt performed pericare in standing) Dynamic Standing - Level of Assistance: 4: Min assist  End of Session PT - End of Session Equipment Utilized During Treatment: Gait belt Activity Tolerance: Treatment limited secondary to medical complications (Comment) (dizziness) Patient left: in chair;with call bell/phone within reach   GP     Jackob Crookston 02/04/2013, 10:08 AM Pager [phone]

## 2013-02-04 NOTE — Discharge Summary (Signed)
Physician Discharge Summary       [address] [place].[address]       [place] [address]             [phone]    Patient ID: [name] MRN: [id number] DOB/AGE: 09-20-1960 52 y.o.  Admit date: 01/21/2013 Discharge date: 02/04/2013  Admission Diagnoses: 1. History of CAD (s/p MI, PCI and stents to Circumflex and LAD 09'; s/p NSTEMI 14') 2. History of tobacco abuse 3. History of ischemic cardiomyopathy 4. History of hyperlipidemia 5. Pericardial effusion with tamponade 6. Acute pericarditis   Discharge Diagnoses:  1. History of CAD (s/p MI, PCI and stents to Circumflex and LAD 09'; s/p NSTEMI 14') 2. History of tobacco abuse 3. History of ischemic cardiomyopathy 4. History of hyperlipidemia 5. Pericardial effusion with tamponade 6. Ruptured left ventricular pseudoaneurysm 6. Acute pericarditis 7. CVA (cerebral infarction) 8. ABL anemia  Consults: ID and neurology  Procedure (s):  Median sternotomy, extracorporeal circulation, repair of  ruptured inferoposterior left ventricular pseudoaneurysm with CorMatrix patch, coronary artery bypass grafting x1 (saphenous vein graft to obtuse marginal 1), endoscopic vein harvest, left thigh by Dr. Dorris Fetch on 01/28/2013.  History of Presenting Illness: This is a 52 yo male with a history of CAD, with stents placed to the LAD and circumflex in [place] in 2007. He presented with a 4 day history of chest pain in September. EKG showed and old inferoposterior MI. He had catheterization which showed a totally occluded RCA and a 50% ISR of the LCx stent. Readmitted with recurrent CP and anterolateral ST elevation on 10/8. An echo showed a large effusion and tamponade. A pericardial drain was placed. The drain was removed 10/14. He was doing well and discharge was anticipated within next 48 hours.  This morning (01/28/2013) he developed sudden onset of left arm weakness. He then developed a left visual field defect. He then had a  sudden hemodynamic collapse with hypotension and tachycardia. He also spiked a temperature to 104.69F. He required intubation and is requiring high levels of PEEP. A repeat echo showed a complex effusion with RV collapse and tamponade. He responded hemodynamically to volume resuscitation and levophed and was started on broad spectrum antibiotics, with a presumed diagnosis of purulent pericarditis. Currently intubated and sedated. A cardiothoracic consultation was obtained with Dr. Dorris Fetch. A discussion was had with the patient's family regarding the need for emergent heart surgery. The patient underwent emergent median sternotomy, repair of  ruptured inferoposterior left ventricular pseudoaneurysm with CorMatrix patch, coronary artery bypass grafting x1 on 01/28/2013.   Brief Hospital Course:  The patient was extubated on post operative day 2 without difficulty. He remained afebrile and hemodynamically stable. Theone Murdoch, a line, chest tubes, and foley were removed early in the post operative course. He was started on Primaxin IV.Per infectious disease, this is to be continued for 2 weeks. His last dose is to be given on 02/11/2013. Lopressor was started and titrated accordingly. He was volume over loaded and diuresed initially with a lasix drip. He is now on lasix orally.He was weaned off the insulin drip. The patient's HGA1C pre op was  5.7. He continues to progress with PT and OT. He has good strength in the right upper and lower extremity. He has an almost plegic left arm and fairly good strength of his left lower extremity. He does still have a left visual field deficit.He is on room air. He has been tolerating a diet (dysphagia 1, thin liquid) per recommendations. He  has had a bowel movement and stared having loose stools this am. C dif is pending.  Epicardial pacing wires and chest tube sutures will be removed prior to discharge. Per Dr. Dorris Fetch, he is felt surgically stable for discharge to CIR  today.  Latest Vital Signs: Blood pressure 106/77, pulse 95, temperature 98.6 F (37 C), temperature source Oral, resp. rate 16, height 6\' 1"  (1.854 m), weight 102.286 kg (225 lb 8 oz), SpO2 99.00%.  Physical Exam: General appearance: alert and no distress  Neurologic: left sided weakness and can abduct left arm slightly  Heart: tachy regular  Lungs: clear to auscultation bilaterally  Wound: clean and dry   Discharge Condition:Stable  Recent laboratory studies:  Lab Results  Component Value Date   WBC 14.7* 02/04/2013   HGB 9.0* 02/04/2013   HCT 27.6* 02/04/2013   MCV 90.5 02/04/2013   PLT 372 02/04/2013   Lab Results  Component Value Date   NA 136 02/04/2013   K 3.6 02/04/2013   CL 99 02/04/2013   CO2 27 02/04/2013   CREATININE 0.76 02/04/2013   GLUCOSE 105* 02/04/2013      Diagnostic Studies: Ct Angio Head W/cm &/or Wo Cm  02/02/2013   CLINICAL DATA:  Left arm weakness and numbness. Left visual field defects.  EXAM: CT ANGIOGRAPHY HEAD AND NECK  TECHNIQUE: Multidetector CT imaging of the head and neck was performed using the standard protocol during bolus administration of intravenous contrast. Multiplanar CT image reconstructions including MIPs were obtained to evaluate the vascular anatomy. Carotid stenosis measurements (when applicable) are obtained utilizing NASCET criteria, using the distal internal carotid diameter as the denominator.  CONTRAST:  80mL OMNIPAQUE IOHEXOL 350 MG/ML SOLN  COMPARISON:  CT head without contrast 01/28/2013.  FINDINGS: CTA HEAD FINDINGS  Multiple new nonhemorrhagic infarcts involve the left superior cerebellum, the posterior left parietal, the lateral left occipital cortex, and the posterior right frontal lobe. An additional area of acute nonhemorrhagic infarction is present in the anterior left frontal lobe on image 19 of series 2. A sub cm white matter infarct is present in the right coronal radiata on image 19 as well.  The postcontrast  images and exhibits same areas of the infarct. No pathologic enhancement is present.  Ventricles are of normal size. No significant extra-axial fluid collection is present. The paranasal sinuses and mastoid air cells are clear. The osseous skull is intact.  The A1 and M1 segments are normal. The left anterior cerebral artery is fenestrated near the anti communicating artery. The aging communicating artery is patent. The ACA and MCA branch vessels are within normal limits.  The right vertebral artery is slightly dominant to the left. The PICA origins are visualized and within normal limits bilaterally. The basilar artery is normal. The right posterior cerebral artery is of fetal type. The left posterior cerebral artery originates from the basilar tip and the left posterior communicating artery.  The source images demonstrate no additional infarcts. More remote lacunar infarcts are present within the left cerebellum.  Review of the MIP images confirms the above findings.  CTA NECK FINDINGS  A standard 3 vessel arch configuration is present. Both vertebral arteries originate from the subclavian arteries. The right vertebral artery is the dominant vessel. There is no significant stenoses or significant caliber change of the vertebral arteries within the cervical vertebral arteries.  The right common carotid artery is within normal limits. The bifurcation is unremarkable. Cervical right internal carotid artery is or bulb.  The left common  carotid artery is within normal limits. The bifurcation is unremarkable. The cervical ICA is normal.  The soft tissues of the neck are unremarkable. The thyroid is within normal limits.  The patient is status post recent median sternotomy. Bilateral pleural effusions and associated atelectasis remaining. There is no significant cervical adenopathy.  Review of the MIP images confirms the above findings.  IMPRESSION: CTA HEAD IMPRESSION  1. New acute/ subacute nonhemorrhagic infarcts  involving the left superior cerebellum, the posterior and medial left parietal lobe and lateral left occipital lobe, the higher left frontal lobe, the posterior right frontal lobe and corona radiata. 2. No significant focal stenosis, aneurysm, or branch vessel occlusion. 3. More remote lacunar infarcts involve the left cerebellum.  CTA NECK IMPRESSION  1. No significant cervical stenoses. Normal appearance of the cervical vasculature. 2. Bilateral pleural effusions and associated atelectasis. 3. Status post recent median sternotomy.   Electronically Signed   By: Gennette Pac M.D.   On: 02/02/2013 13:54    Ct Head Wo Contrast  01/28/2013   CLINICAL DATA:  Code stroke.  Weakness.  EXAM: CT HEAD WITHOUT CONTRAST  TECHNIQUE: Contiguous axial images were obtained from the base of the skull through the vertex without intravenous contrast.  COMPARISON:  None.  FINDINGS: No mass lesion. No midline shift. No acute hemorrhage or hematoma. No extra-axial fluid collections. No evidence of acute infarction.  There is an old left cerebellar hemisphere infarct. There are a few tiny vague periventricular white matter lucencies most likely representing chronic small vessel ischemic changes.  Osseous structures are normal.  IMPRESSION: No acute abnormality. Old left cerebellar infarct. Chronic small vessel ischemic changes.  Critical Value/emergent results were called by telephone at the time of interpretation on 01/28/2013 at 7:08 AM to [facility], RN, who verbally acknowledged these results.   Electronically Signed   By: Geanie Cooley M.D.   On: 01/28/2013 07:09   Ct Angio Neck W/cm &/or Wo/cm  02/02/2013   CLINICAL DATA:  Left arm weakness and numbness. Left visual field defects.  EXAM: CT ANGIOGRAPHY HEAD AND NECK  TECHNIQUE: Multidetector CT imaging of the head and neck was performed using the standard protocol during bolus administration of intravenous contrast. Multiplanar CT image reconstructions including MIPs were  obtained to evaluate the vascular anatomy. Carotid stenosis measurements (when applicable) are obtained utilizing NASCET criteria, using the distal internal carotid diameter as the denominator.  CONTRAST:  80mL OMNIPAQUE IOHEXOL 350 MG/ML SOLN  COMPARISON:  CT head without contrast 01/28/2013.  FINDINGS: CTA HEAD FINDINGS  Multiple new nonhemorrhagic infarcts involve the left superior cerebellum, the posterior left parietal, the lateral left occipital cortex, and the posterior right frontal lobe. An additional area of acute nonhemorrhagic infarction is present in the anterior left frontal lobe on image 19 of series 2. A sub cm white matter infarct is present in the right coronal radiata on image 19 as well.  The postcontrast images and exhibits same areas of the infarct. No pathologic enhancement is present.  Ventricles are of normal size. No significant extra-axial fluid collection is present. The paranasal sinuses and mastoid air cells are clear. The osseous skull is intact.  The A1 and M1 segments are normal. The left anterior cerebral artery is fenestrated near the anti communicating artery. The aging communicating artery is patent. The ACA and MCA branch vessels are within normal limits.  The right vertebral artery is slightly dominant to the left. The PICA origins are visualized and within normal limits bilaterally. The basilar  artery is normal. The right posterior cerebral artery is of fetal type. The left posterior cerebral artery originates from the basilar tip and the left posterior communicating artery.  The source images demonstrate no additional infarcts. More remote lacunar infarcts are present within the left cerebellum.  Review of the MIP images confirms the above findings.  CTA NECK FINDINGS  A standard 3 vessel arch configuration is present. Both vertebral arteries originate from the subclavian arteries. The right vertebral artery is the dominant vessel. There is no significant stenoses or  significant caliber change of the vertebral arteries within the cervical vertebral arteries.  The right common carotid artery is within normal limits. The bifurcation is unremarkable. Cervical right internal carotid artery is or bulb.  The left common carotid artery is within normal limits. The bifurcation is unremarkable. The cervical ICA is normal.  The soft tissues of the neck are unremarkable. The thyroid is within normal limits.  The patient is status post recent median sternotomy. Bilateral pleural effusions and associated atelectasis remaining. There is no significant cervical adenopathy.  Review of the MIP images confirms the above findings.  IMPRESSION: CTA HEAD IMPRESSION  1. New acute/ subacute nonhemorrhagic infarcts involving the left superior cerebellum, the posterior and medial left parietal lobe and lateral left occipital lobe, the higher left frontal lobe, the posterior right frontal lobe and corona radiata. 2. No significant focal stenosis, aneurysm, or branch vessel occlusion. 3. More remote lacunar infarcts involve the left cerebellum.  CTA NECK IMPRESSION  1. No significant cervical stenoses. Normal appearance of the cervical vasculature. 2. Bilateral pleural effusions and associated atelectasis. 3. Status post recent median sternotomy.   Electronically Signed   By: Gennette Pac M.D.   On: 02/02/2013 13:54   Dg Chest Port 1 View  02/04/2013   CLINICAL DATA:  Post CABG, repair of left ventricular pseudoaneurysm  EXAM: PORTABLE CHEST - 1 VIEW  COMPARISON:  Portable chest x-ray of 02/03/2013  FINDINGS: Moderate cardiomegaly is stable with perhaps minimal pulmonary vascular congestion and basilar volume loss. Small effusions cannot be excluded. A right IJ central venous line is unchanged in position overlying the lower SVC. Median sternotomy sutures are noted.  IMPRESSION: Stable cardiomegaly. Minimal basilar atelectasis with questionable mild pulmonary vascular congestion.   Electronically  Signed   By: Dwyane Dee M.D.   On: 02/04/2013 09:14         Future Appointments Provider Department Dept Phone   02/05/2013 9:00 AM Daneil Dan, OT [facility] [address] [facility] CENTER A [phone]   02/05/2013 1:30 PM Daneil Dan, OT [facility] [address] [facility] CENTER A [phone]   03/03/2013 11:00 AM Loreli Slot, MD [facility] [phone]      Discharge Medications:   Medication List    STOP taking these medications       nitroGLYCERIN 0.4 MG SL tablet  Commonly known as:  NITROSTAT      TAKE these medications       aspirin 325 MG EC tablet  Take 1 tablet (325 mg total) by mouth daily.     clopidogrel 75 MG tablet  Commonly known as:  PLAVIX  Take 1 tablet (75 mg total) by mouth daily.     feeding supplement (ENSURE COMPLETE) Liqd  Take 237 mLs by mouth 3 (three) times daily between meals.     furosemide 40 MG tablet  Commonly known as:  LASIX  Take 1 tablet (40 mg total)  by mouth 2 (two) times daily.     guaiFENesin-dextromethorphan 100-10 MG/5ML syrup  Commonly known as:  ROBITUSSIN DM  Take 15 mLs by mouth every 4 (four) hours as needed for cough.     lisinopril 5 MG tablet  Commonly known as:  PRINIVIL,ZESTRIL  Take 1 tablet (5 mg total) by mouth daily.     loperamide 1 MG/5ML solution  Commonly known as:  IMODIUM  Take 5 mLs (1 mg total) by mouth as needed for diarrhea or loose stools.     metoprolol tartrate 25 MG tablet  Commonly known as:  LOPRESSOR  Take 1 tablet (25 mg total) by mouth 3 (three) times daily.     ondansetron 4 MG/2ML Soln injection  Commonly known as:  ZOFRAN  Inject 2 mLs (4 mg total) into the vein every 6 (six) hours as needed for nausea or vomiting.     oxyCODONE 5 MG immediate release tablet  Commonly known as:  Oxy IR/ROXICODONE  Take 1-2 tablets (5-10 mg total) by mouth every 4 (four) hours as needed for pain.     pantoprazole 40 MG  tablet  Commonly known as:  PROTONIX  Take 1 tablet (40 mg total) by mouth daily. May stop once discharged from inpatient rehab     potassium chloride SA 20 MEQ tablet  Commonly known as:  K-DUR,KLOR-CON  Take 1 tablet (20 mEq total) by mouth 2 (two) times daily.     simvastatin 40 MG tablet  Commonly known as:  ZOCOR  Take 1 tablet (40 mg total) by mouth at bedtime.     sodium chloride 0.9 % SOLN 100 mL with imipenem-cilastatin 500 MG SOLR 500 mg  Inject 500 mg into the vein every 6 (six) hours.        Follow Up Appointments: Follow-up Information   Follow up with Peter Swaziland, MD. (Call for a follow up for 2 weeks)    Specialty:  Cardiology   Contact information:   [address] [place] [address] [phone]       Follow up with Gates Rigg, MD. (Call for a follow up appointment for 2 weeks)    Specialties:  Neurology, Radiology   Contact information:   [address] [place] [address] [phone]       Follow up with Loreli Slot, MD. (PA/LAT CXR to be taken (at [facility] which is in the same building as Dr. Sunday Corn office) on 03/03/2013 at 10:00 am;Appointment with Dr. Dorris Fetch is on 02/23/2013 at 11:00 am)    Specialty:  Cardiothoracic Surgery   Contact information:   [address] [place] [address] [phone]       Signed: Doree Fudge MPA-C 02/04/2013, 3:34 PM

## 2013-02-04 NOTE — Progress Notes (Signed)
Patient being admitted to CIR. CSW signing off at this time. Please re consult if CSW needs arise.  [name], MSW, [name] [phone]

## 2013-02-04 NOTE — PMR Pre-admission (Signed)
PMR Admission Coordinator Pre-Admission Assessment  Patient: [name] is an 52 y.o., male MRN: [id number] DOB: 10-01-1960 Height: 6\' 1"  (185.4 cm) Weight: 102.286 kg (225 lb 8 oz)              Insurance Information PRIMARY: selfpay        Medicaid Application Date: 02/04/13      Case Manager: tba Disability Application Date: 02/04/13      Case Worker: tba  Emergency Contact Information Contact Information   Name Relation Home Work Mobile   Poole,[name] Significant other   [phone]   Judd Gaudier    [phone]     Current Medical History  Patient Admitting Diagnosis: embolic bicerebral infarcts with predominantly left sided weakness  History of Present Illness:Reginal B Tucker is a 52 y.o. male with history of CAD and recent NSTEMI 01/05/13 treated medically. He was readmitted on 01/31/13 with recurrent chest pain due to acute pericarditis and cardiac tamponade (Dressler syndrome). He underwent pericardiocentesis 10/11 with improvement in symptoms and drain removed 10/14.   On 10/15, he developed LUE sensory loss with left hand weakness and ataxia,left visual deficits. He was noted to be hypotensive, hypoxic with fever and chills. He was intubated and started on broad spectrum antibiotics for sepsis and CT head negative. Repeat echo showed a complex effusion with RV collapse and tamponade. He was taken to OR the same day for CABG X1 with repair of LV ruptured inferoposterior pseudoaneurysm by Dr. Dorris Fetch.   Dr. Luciana Axe and Dr. Pearlean Brownie consulted for input. CTA head/neck with New acute/ subacute nonhemorrhagic infarcts involving the left superior cerebellum, the posterior and medial left parietal lobe and lateral left occipital lobe, the higher left frontal lobe, the posterior right frontal lobe and corona radiata. He was extubated without difficulty. Neurology feels that patient with embolic stroke on unknown source but suspected to be due to sepsis. Antibiotics narrowed to  imipenem with recommendations for 14 total days antibiotics--stop date 10/29.    Total: 2 [facility]  Past Medical History  Past Medical History  Diagnosis Date  . Coronary artery disease     a. s/p MI in 2009 in [place] with stenting of the LCX and LAD;  b. 12/2012 NSTEMI/CAD: LM nl, LAD patent prox stent, LCX 50-70 isr (FFR 0.84), RCA dom, 17m, EF 40-45%, sev basal to mid inf HK to AK-->Med Rx.  . Tobacco abuse     a. quit 12/2012.  . Ischemic cardiomyopathy     a. 12/2012 Ech: EF 40-45%, Gr 1 DD.  Marland Kitchen Hyperlipidemia     Family History  family history includes CAD in an other family member.  Prior Rehab/Hospitalizations: none  Current Medications  Current facility-administered medications:0.9 %  sodium chloride infusion, 250 mL, Intravenous, PRN, Loreli Slot, MD;  ALPRAZolam Prudy Feeler) tablet 0.25 mg, 0.25 mg, Oral, Q6H PRN, Loreli Slot, MD;  alum & mag hydroxide-simeth (MAALOX/MYLANTA) 200-200-20 MG/5ML suspension 15-30 mL, 15-30 mL, Oral, Q4H PRN, Loreli Slot, MD;  aspirin chewable tablet 324 mg, 324 mg, Per Tube, Daily, Wayne E Gold, PA-C, 324 mg at 01/29/13 6295 aspirin EC tablet 325 mg, 325 mg, Oral, Daily, Wayne E Gold, PA-C, 325 mg at 02/04/13 1100;  clopidogrel (PLAVIX) tablet 75 mg, 75 mg, Oral, Daily, Loreli Slot, MD;  enoxaparin (LOVENOX) injection 40 mg, 40 mg, Subcutaneous, Q24H, Loreli Slot, MD, 40 mg at 02/04/13 1000;  feeding supplement (ENSURE COMPLETE) (ENSURE COMPLETE) liquid 237 mL, 237 mL, Oral, TID BM, Ailene Ards,  RD, 237 mL at 02/04/13 1000 furosemide (LASIX) tablet 40 mg, 40 mg, Oral, BID, Loreli Slot, MD, 40 mg at 02/04/13 0800;  guaiFENesin-dextromethorphan (ROBITUSSIN DM) 100-10 MG/5ML syrup 15 mL, 15 mL, Oral, Q4H PRN, Loreli Slot, MD;  imipenem-cilastatin (PRIMAXIN) 500 mg in sodium chloride 0.9 % 100 mL IVPB, 500 mg, Intravenous, Q6H, Gardiner Barefoot, MD, 500 mg at 02/04/13 0800 lisinopril  (PRINIVIL,ZESTRIL) tablet 5 mg, 5 mg, Oral, Daily, Loreli Slot, MD, 5 mg at 02/04/13 1200;  loperamide (IMODIUM) 1 MG/5ML solution 1 mg, 1 mg, Oral, PRN, Loreli Slot, MD;  magnesium hydroxide (MILK OF MAGNESIA) suspension 30 mL, 30 mL, Oral, Daily PRN, Loreli Slot, MD;  metoprolol tartrate (LOPRESSOR) tablet 25 mg, 25 mg, Oral, TID, Loreli Slot, MD, 25 mg at 02/04/13 1100 ondansetron (ZOFRAN) injection 4 mg, 4 mg, Intravenous, Q6H PRN, Wayne E Gold, PA-C;  oxyCODONE (Oxy IR/ROXICODONE) immediate release tablet 5-10 mg, 5-10 mg, Oral, Q3H PRN, Wayne E Gold, PA-C;  pantoprazole (PROTONIX) EC tablet 40 mg, 40 mg, Oral, Daily, Wayne E Gold, PA-C, 40 mg at 02/04/13 0900;  potassium chloride SA (K-DUR,KLOR-CON) CR tablet 20 mEq, 20 mEq, Oral, BID, Loreli Slot, MD, 20 mEq at 02/04/13 1100 simvastatin (ZOCOR) tablet 40 mg, 40 mg, Oral, QHS, Loreli Slot, MD;  sodium chloride 0.9 % injection 3 mL, 3 mL, Intravenous, Q12H, Loreli Slot, MD;  sodium chloride 0.9 % injection 3 mL, 3 mL, Intravenous, PRN, Loreli Slot, MD;  zolpidem (AMBIEN) tablet 5 mg, 5 mg, Oral, QHS PRN, Loreli Slot, MD  Patients Current Diet: General Regular diet with thin liquids  Precautions / Restrictions Precautions Precautions: Fall;Sternal Restrictions Weight Bearing Restrictions: No   Prior Activity Level Community (5-7x/wk): 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  Home Assistive Devices / Equipment Home Assistive Devices/Equipment: None Home Equipment: None  Prior Functional Level Prior Function Level of Independence: Independent Comments: was to start work at [employer] to drive trucks  Current Functional Level Cognition  Overall Cognitive Status: Impaired/Different from baseline Current Attention Level: Selective Orientation Level:  Oriented X4 Following Commands: Follows one step commands consistently General Comments: Pt with difficulty recalling when he is going to rehab despite reminders    Extremity Assessment (includes Sensation/Coordination)          ADLs  Eating/Feeding: Moderate assistance (drink from cup with Lt. UE) Where Assessed - Eating/Feeding: Chair Grooming: Wash/dry face;Moderate assistance (Lt UE) Where Assessed - Grooming: Supine, head of bed up Upper Body Bathing: Maximal assistance Where Assessed - Upper Body Bathing: Supine, head of bed up Lower Body Bathing: +1 Total assistance Where Assessed - Lower Body Bathing: Supine, head of bed up;Rolling right and/or left Upper Body Dressing: +1 Total assistance Where Assessed - Upper Body Dressing: Supine, head of bed up;Unsupported sitting Lower Body Dressing: +1 Total assistance Where Assessed - Lower Body Dressing: Supine, head of bed up;Rolling right and/or left;Supported sit to stand Toilet Transfer: +1 Total assistance (too fatigued to attempt) Transfers/Ambulation Related to ADLs: Pt did not attempt due to fatigue this pm ADL Comments: Worked on facilitation of reach with Lt. UE - min - mod A/facilitation.  Pt able to drink from cup with min - mod facilitation/assist and wash face with min-mod A/faciliation with Lt. UE.    Mobility  Bed Mobility: Rolling Left;Left Sidelying to Sit;Sitting -  Scoot to [place] of Bed Rolling Left: 4: Min assist Left Sidelying to Sit: 3: Mod assist Sitting - Scoot to Edge of Bed: 5: Supervision Sit to Supine: 3: Mod assist    Transfers  Transfers: Sit to Stand;Stand to Sit Sit to Stand: 4: Min assist Sit to Stand: Patient Percentage: 50% Stand to Sit: 4: Min guard Stand to Sit: Patient Percentage: 60%    Ambulation / Gait / Stairs / Psychologist, prison and probation services  Ambulation/Gait Ambulation/Gait Assistance: 4: Min Environmental consultant (Feet): 70 Feet Assistive device: Other (Comment) (pt holding IV pole in  Rt hand) Ambulation/Gait Assistance Details: Pt reports dizziness increases with movement. Educated on visual comopensation to fix his gaze on an object (used handle on IV pole). He was able to decr dizziness from a 7.5 to 5 out of 10 Gait Pattern: Decreased stride length;Step-through pattern;Wide base of support;Decreased stance time - left Gait velocity: slow Stairs: No    Posture / Balance Static Sitting Balance Static Sitting - Balance Support: No upper extremity supported;Feet supported Static Sitting - Level of Assistance: 7: Independent Static Sitting - Comment/# of Minutes: 5 min Static Standing Balance Static Standing - Balance Support: No upper extremity supported Static Standing - Level of Assistance: 5: Stand by assistance Static Standing - Comment/# of Minutes: 3 min to talk to Dr. Pearlean Brownie Dynamic Standing Balance Dynamic Standing - Balance Support: No upper extremity supported;During functional activity (pt performed pericare in standing) Dynamic Standing - Level of Assistance: 4: Min assist    Special needs/care consideration Bowel mgmt: continent Bladder mgmt: continent    Previous Home Environment Living Arrangements:  (lives with step daughter , Judd Gaudier. She moved in with hi)  Lives With: Other (Comment) (4 yo stepdaughter, Judd Gaudier) Available Help at Discharge: Family;Available 24 hours/day Type of Home: Apartment Home Layout: Multi-level Alternate Level Stairs-Number of Steps: 10 steps to second level with bedroom and full bath Home Access: Stairs to enter Entrance Stairs-Rails: None Entrance Stairs-Number of Steps: 3 Bathroom Shower/Tub: Counselling psychologist: Yes How Accessible: Accessible via walker Home Care Services: No Additional Comments: 1/2 bath downstairs with kitchen and living room  Discharge Living Setting Plans for Discharge Living Setting: Apartment;Lives with (comment) (59 yo  stepdaughter, Tiana) Type of Home at Discharge: Apartment Discharge Home Layout: Multi-level Alternate Level Stairs-Number of Steps: 10 steps Discharge Home Access: Stairs to enter Entrance Stairs-Rails: None Entrance Stairs-Number of Steps: 3 Discharge Bathroom Shower/Tub: Tub/shower unit;Curtain Discharge Bathroom Toilet: Standard Discharge Bathroom Accessibility: Yes How Accessible: Accessible via walker Does the patient have any problems obtaining your medications?: Yes (Describe) (no insurance)  Social/Family/Support Systems Patient Roles: Partner;Parent;Other (Comment) (employee; long distance truck driver with [employer] ) Solicitor Information: Dorann Lodge and Judd Gaudier Anticipated Caregiver: Bennie Hind has been here for one year and unemployed Anticipated Industrial/product designer Information: Dorann Lodge and Bennie Hind, see above Ability/Limitations of Caregiver: can provide 24/7 min physical assist  Caregiver Availability: 24/7 Discharge Plan Discussed with Primary Caregiver: Yes Is Caregiver In Agreement with Plan?: Yes Does Caregiver/Family have Issues with Lodging/Transportation while Pt is in Rehab?: No    Goals/Additional Needs Patient/Family Goal for Rehab: supervision to min assist with PT, OTm and supervision with SLP Expected length of stay: ELOS 2 to 3 weeks Pt/Family Agrees to Admission and willing to participate: Yes Program Orientation Provided & Reviewed with Pt/Caregiver Including Roles  & Responsibilities: Yes   Decrease burden of Care through IP rehab admission: n/a  Possible need for SNF placement upon discharge:not  anticipated  Patient Condition: This patient's condition remains as documented in the consult dated 02/03/13, in which the Rehabilitation Physician determined and documented that the patient's condition is appropriate for intensive rehabilitative care in an inpatient rehabilitation facility. Will admit to inpatient rehab today.  Preadmission Screen Completed  By:  Clois Dupes, 02/04/2013 1:43 PM ______________________________________________________________________   Discussed status with Dr. Riley Kill on 02/04/13 at  1348 and received telephone approval for admission today.  Admission Coordinator:  Clois Dupes, time 8295 Date 02/04/13.

## 2013-02-04 NOTE — Progress Notes (Signed)
Patient ID: [name], male   DOB: 25-Sep-1960, 52 y.o.   MRN: [id number] Patient arrived to floor via bed, escorted by nursing staff.  Patients family with patient.  Patient and family verbalized understanding of rehab procedure, no questions asked.  Signed fall prevention safety plan.  Appears to be in no immediate distress at this time. Dani Gobble, RN

## 2013-02-05 ENCOUNTER — Inpatient Hospital Stay (HOSPITAL_COMMUNITY): Payer: Medicaid Other | Admitting: Speech Pathology

## 2013-02-05 ENCOUNTER — Inpatient Hospital Stay (HOSPITAL_COMMUNITY): Payer: Medicaid Other | Admitting: *Deleted

## 2013-02-05 ENCOUNTER — Inpatient Hospital Stay (HOSPITAL_COMMUNITY): Payer: Self-pay

## 2013-02-05 ENCOUNTER — Inpatient Hospital Stay (HOSPITAL_COMMUNITY): Payer: Medicaid Other

## 2013-02-05 DIAGNOSIS — D62 Acute posthemorrhagic anemia: Secondary | ICD-10-CM | POA: Diagnosis present

## 2013-02-05 DIAGNOSIS — D72829 Elevated white blood cell count, unspecified: Secondary | ICD-10-CM | POA: Diagnosis present

## 2013-02-05 DIAGNOSIS — G8110 Spastic hemiplegia affecting unspecified side: Secondary | ICD-10-CM

## 2013-02-05 DIAGNOSIS — I6340 Cerebral infarction due to embolism of unspecified cerebral artery: Secondary | ICD-10-CM

## 2013-02-05 LAB — CBC WITH DIFFERENTIAL/PLATELET
Basophils Relative: 0 % (ref 0–1)
Eosinophils Relative: 0 % (ref 0–5)
HCT: 27.3 % — ABNORMAL LOW (ref 39.0–52.0)
Hemoglobin: 8.8 g/dL — ABNORMAL LOW (ref 13.0–17.0)
Lymphocytes Relative: 19 % (ref 12–46)
Lymphs Abs: 2.5 10*3/uL (ref 0.7–4.0)
MCH: 29 pg (ref 26.0–34.0)
MCV: 90.1 fL (ref 78.0–100.0)
Monocytes Relative: 8 % (ref 3–12)
Neutro Abs: 9.8 10*3/uL — ABNORMAL HIGH (ref 1.7–7.7)
Platelets: 406 10*3/uL — ABNORMAL HIGH (ref 150–400)
RBC: 3.03 MIL/uL — ABNORMAL LOW (ref 4.22–5.81)
WBC: 13.4 10*3/uL — ABNORMAL HIGH (ref 4.0–10.5)

## 2013-02-05 LAB — COMPREHENSIVE METABOLIC PANEL
AST: 42 U/L — ABNORMAL HIGH (ref 0–37)
CO2: 24 mEq/L (ref 19–32)
Chloride: 97 mEq/L (ref 96–112)
Creatinine, Ser: 0.73 mg/dL (ref 0.50–1.35)
GFR calc Af Amer: >90 mL/min (ref >90)
GFR calc non Af Amer: >90 mL/min (ref >90)
Glucose, Bld: 99 mg/dL (ref 70–99)
Total Bilirubin: 0.6 mg/dL (ref 0.3–1.2)

## 2013-02-05 NOTE — Progress Notes (Signed)
Occupational Therapy Session Note  Patient Details  Name: [name] MRN: [id number] Date of Birth: March 12, 1961  Today's Date: 02/05/2013 Time: 1330-1400 Time Calculation (min): 30 min  Short Term Goals: Week 1:  OT Short Term Goal 1 (Week 1): Pt will complete toilet transfer with min assist OT Short Term Goal 2 (Week 1): Pt will complete shower transfer with min assi OT Short Term Goal 3 (Week 1): Pt will complete bathing with mod assist  OT Short Term Goal 4 (Week 1): Pt will complete LB dressing with mod assist  Skilled Therapeutic Interventions/Progress Updates:  Balance/vestibular training;Cognitive remediation/compensation;Community reintegration;Discharge planning;DME/adaptive equipment instruction;Functional mobility training;Functional electrical stimulation;Neuromuscular re-education;Pain management;Psychosocial support;Patient/family education;Self Care/advanced ADL retraining;Therapeutic Activities;Therapeutic Exercise;UE/LE Strength taining/ROM;UE/LE Coordination activities;Visual/perceptual remediation/compensation  Pt received supine in bed with HOB raised finishing lunch with fiancee present. Pt wearing hospital gown and agreed to complete dressing. Pt complete supine>sit with mod assist. Required total assist for LB dressing and completed sit>stand with mod assist and min assist standing balance. Pt assisted with managing pants over right hip. Pt required frequent rest breaks secondary to fatigue during therapy session. Pt required max cues to follow sternal precautions during bed mobility and sit<>stand. Provided 2 handouts of sternal precautions to place in room as reminders. Pt read precautions to therapist to ensure there were no visual disturbances affecting ability to use cues, however cognitive deficits will be main barrier. Pt's fiance present during session and very attentive to therapy. Educated her on precautions as well. Pt requesting to lay down at end of session.  Completed sit>stand with mod assist then side stepped toward [facility] with mod assist. Completed sit>supine with mod assist. Pt left in bed with all items in reach and fiancee present.   Therapy Documentation Precautions:  Precautions Precautions: Fall;Sternal Precaution Comments: decreased recall of sternal precautions Restrictions Weight Bearing Restrictions: No General:   Vital Signs:   Pain: No c/o pain during therapy session.   See FIM for current functional status  Therapy/Group: Individual Therapy  Daneil Dan 02/05/2013, 2:09 PM

## 2013-02-05 NOTE — Evaluation (Signed)
I have reviewed and agree with the treatment as reflected in this note. Rosaleen Mazer, PT DPT  

## 2013-02-05 NOTE — Progress Notes (Signed)
Subjective/Complaints: 52 y.o. male with history of CAD and recent NSTEMI 01/05/13 treated medically. He was readmitted on 01/31/13 with recurrent chest pain due to acute pericarditis and cardiac tamponade (Dressler syndrome). He underwent pericardiocentesis 10/11 with improvement in symptoms and drain removed 10/14. On 10/15, he developed LUE sensory loss with left hand weakness and ataxia,left visual deficits. He was noted to be hypotensive, hypoxic with fever and chills. He was intubated and started on broad spectrum antibiotics for sepsis and CT head negative. Repeat echo showed a complex effusion with RV collapse and tamponade. He was taken to OR the same day for CABG X1 with repair of LV ruptured inferoposterior pseudoaneurysm by Dr. Dorris Fetch.  Dr. Luciana Axe and Dr. Pearlean Brownie consulted for input. CTA head/neck with New acute/ subacute nonhemorrhagic infarcts involving the left superior cerebellum, the posterior and medial left parietal lobe and lateral left occipital lobe, the higher left frontal lobe, the posterior right frontal lobe and corona radiata. He was extubated without difficulty. Neurology feels that patient with embolic stroke on unknown source but suspected to be due to sepsis. Antibiotics narrowed to imipenem with recommendations for 14 total days antibiotics--stop date 10/29   Review of Systems - Negative except Left arm weakness  Objective: Vital Signs: Blood pressure 122/82, pulse 102, temperature 98.3 F (36.8 C), temperature source Oral, resp. rate 20, height 6\' 1"  (1.854 m), weight 103.5 kg (228 lb 2.8 oz), SpO2 95.00%. Dg Chest Port 1 View  02/04/2013   CLINICAL DATA:  Post CABG, repair of left ventricular pseudoaneurysm  EXAM: PORTABLE CHEST - 1 VIEW  COMPARISON:  Portable chest x-ray of 02/03/2013  FINDINGS: Moderate cardiomegaly is stable with perhaps minimal pulmonary vascular congestion and basilar volume loss. Small effusions cannot be excluded. A right IJ central venous line  is unchanged in position overlying the lower SVC. Median sternotomy sutures are noted.  IMPRESSION: Stable cardiomegaly. Minimal basilar atelectasis with questionable mild pulmonary vascular congestion.   Electronically Signed   By: Dwyane Dee M.D.   On: 02/04/2013 09:14   Results for orders placed during the hospital encounter of 02/04/13 (from the past 72 hour(s))  CBC WITH DIFFERENTIAL     Status: Abnormal   Collection Time    02/05/13  5:00 AM      Result Value Range   WBC 13.4 (*) 4.0 - 10.5 K/uL   RBC 3.03 (*) 4.22 - 5.81 MIL/uL   Hemoglobin 8.8 (*) 13.0 - 17.0 g/dL   HCT 16.1 (*) 09.6 - 04.5 %   MCV 90.1  78.0 - 100.0 fL   MCH 29.0  26.0 - 34.0 pg   MCHC 32.2  30.0 - 36.0 g/dL   RDW 40.9 (*) 81.1 - 91.4 %   Platelets 406 (*) 150 - 400 K/uL   Neutrophils Relative % 73  43 - 77 %   Lymphocytes Relative 19  12 - 46 %   Monocytes Relative 8  3 - 12 %   Eosinophils Relative 0  0 - 5 %   Basophils Relative 0  0 - 1 %   Neutro Abs 9.8 (*) 1.7 - 7.7 K/uL   Lymphs Abs 2.5  0.7 - 4.0 K/uL   Monocytes Absolute 1.1 (*) 0.1 - 1.0 K/uL   Eosinophils Absolute 0.0  0.0 - 0.7 K/uL   Basophils Absolute 0.0  0.0 - 0.1 K/uL   RBC Morphology POLYCHROMASIA PRESENT    COMPREHENSIVE METABOLIC PANEL     Status: Abnormal   Collection Time  02/05/13  5:00 AM      Result Value Range   Sodium 132 (*) 135 - 145 mEq/L   Potassium 3.6  3.5 - 5.1 mEq/L   Chloride 97  96 - 112 mEq/L   CO2 24  19 - 32 mEq/L   Glucose, Bld 99  70 - 99 mg/dL   BUN 10  6 - 23 mg/dL   Creatinine, Ser 1.61  0.50 - 1.35 mg/dL   Calcium 8.3 (*) 8.4 - 10.5 mg/dL   Total Protein 6.3  6.0 - 8.3 g/dL   Albumin 2.3 (*) 3.5 - 5.2 g/dL   AST 42 (*) 0 - 37 U/L   ALT 31  0 - 53 U/L   Alkaline Phosphatase 134 (*) 39 - 117 U/L   Total Bilirubin 0.6  0.3 - 1.2 mg/dL   GFR calc non Af Amer >90  >90 mL/min   GFR calc Af Amer >90  >90 mL/min   Comment: (NOTE)     The eGFR has been calculated using the CKD EPI equation.     This  calculation has not been validated in all clinical situations.     eGFR's persistently <90 mL/min signify possible Chronic Kidney     Disease.     HEENT: normal Cardio: RRR and No murmurs or extra sounds Resp: CTA B/L and Unlabored GI: BS positive and Nontender nondistended Extremity:  Pulses positive and Edema Left dorsal hand Skin:   Wound C/D/I and Sternotomy wound is well healed. Drain sites still in completely closed, small amount of dried blood Neuro: Alert/Oriented, Flat, Cranial Nerve II-XII normal, Abnormal Sensory Paresthesias left arm, Normal Motor and Tone:  Hypotonia Musc/Skel:  Other No pain with left shoulder range of motion. General no acute distress Motor strength is 0/5 in the left deltoid, bicep, tricep, grip 4/5 in the left hip flexor, knee extensors, ankle dorsi flexion plantar flexor 5/5 in the right upper and right lower limb  Assessment/Plan: 1. Functional deficits secondary to right posterior frontal and corona radiata infarcts causing left upper extremity weakness, also deconditioned from coronary artery disease status post CABG which require 3+ hours per day of interdisciplinary therapy in a comprehensive inpatient rehab setting. Physiatrist is providing close team supervision and 24 hour management of active medical problems listed below. Physiatrist and rehab team continue to assess barriers to discharge/monitor patient progress toward functional and medical goals. FIM:    FIM - Upper Body Dressing/Undressing Upper body dressing/undressing: 0: Wears gown/pajamas-no public clothing FIM - Lower Body Dressing/Undressing Lower body dressing/undressing: 0: Wears Oceanographer              Comprehension Comprehension Mode: Auditory Comprehension: 5-Understands complex 90% of the time/Cues < 10% of the time  Expression Expression Mode: Verbal Expression: 6-Expresses complex ideas: With extra time/assistive device  Social  Interaction Social Interaction: 5-Interacts appropriately 90% of the time - Needs monitoring or encouragement for participation or interaction.  Problem Solving Problem Solving Mode: Asleep  Memory Memory Mode: Asleep  Medical Problem List and Plan:  1. DVT Prophylaxis/Anticoagulation: Pharmaceutical: Lovenox  2. Pain Management: prn medications effective.  3. Mood: Provide ego support--has supportive girlfriend. Will have LCSW follow for evaluation.  4. Neuropsych: This patient is capable of making decisions on his own behalf.  5. Pericarditis: Continue imipenem through 02/11/13. Leucocytosis resolving.  6. Antibiotic associated diarrhea: C-diff check 10/21 was negative. Will add probiotics and fiber to help with symptoms.  7. ABLA: Add iron supplement  8.  CAD/s/p CABG X 1: sternal precautions. Continue ASA/plavix, lisinopril, zocor and metoprolol    LOS (Days) 1 A FACE TO FACE EVALUATION WAS PERFORMED  Velva Molinari E 02/05/2013, 10:02 AM

## 2013-02-05 NOTE — Progress Notes (Signed)
Social Work Assessment and Plan Social Work Assessment and Plan  Patient Details  Name: [name] MRN: [id number] Date of Birth: 1960-08-26  Today's Date: 02/05/2013  Problem List:  Patient Active Problem List   Diagnosis Date Noted  . Leucocytosis 02/05/2013  . Acute blood loss anemia 02/05/2013  . CVA (cerebral infarction) 01/28/2013  . Pericardial tamponade 01/23/2013  . Acute pericarditis, unspecified 01/22/2013  . Fever 01/22/2013  . Chest pain 01/21/2013  . Hypokalemia 01/07/2013  . Non-STEMI (non-ST elevated myocardial infarction) 01/05/2013   Past Medical History:  Past Medical History  Diagnosis Date  . Coronary artery disease     a. s/p [place] in 2009 in [place] with stenting of the LCX and LAD;  b. 12/2012 NSTEMI/CAD: LM nl, LAD patent prox stent, LCX 50-70 isr (FFR 0.84), RCA dom, 170m, EF 40-45%, sev basal to mid inf HK to AK-->Med Rx.  . Tobacco abuse     a. quit 12/2012.  . Ischemic cardiomyopathy     a. 12/2012 Ech: EF 40-45%, Gr 1 DD.  Marland Kitchen Hyperlipidemia    Past Surgical History:  Past Surgical History  Procedure Laterality Date  . Coronary angioplasty with stent placement  2000's X 2    "1 + 1" (01/06/2013)  . Cardiac catheterization  01/06/2013  . Coronary artery bypass graft N/A 01/28/2013    Procedure: CORONARY ARTERY BYPASS GRAFTING (CABG);  Surgeon: Loreli Slot, MD;  [facility]: [facility] OR;  Service: Open Heart Surgery;  Laterality: N/A;  CABG x one, using left greater saphenous vein harvested endoscopically  . Intraoperative transesophageal echocardiogram N/A 01/28/2013    Procedure: INTRAOPERATIVE TRANSESOPHAGEAL ECHOCARDIOGRAM;  Surgeon: Loreli Slot, MD;  Location: [facility] OR;  Service: Open Heart Surgery;  Laterality: N/A;  . Ventricular aneurysm resection N/A 01/28/2013    Procedure: LEFT VENTRICULAR ANEURYSM REPAIR;  Surgeon: Loreli Slot, MD;  Location: [facility] OR;  Service: Open Heart Surgery;  Laterality: N/A;   Social History:  reports  that he quit smoking about 4 weeks ago. His smoking use included Cigarettes. He has a 35 pack-year smoking history. He has never used smokeless tobacco. He reports that he does not drink alcohol or use illicit drugs.  Family / Support Systems Marital Status: Single Patient Roles: Parent;Other (Comment) (Employee) Children: Cinda Quest [phone] Other Supports: Pincus Badder [phone]  Anticipated Caregiver: Bennie Hind Ability/Limitations of Caregiver: She is currently not working and can provide assist to pt at discharge. Caregiver Availability: 24/7 Family Dynamics: Pt has two step-daughter's who are supportive and local, he also has anothe child here and one in [place].  He has always been very independent and not used to the patient role.  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 Legal Hisotry/Current Legal Issues: No issues Guardian/Conservator: None-according to MD pt is capable of making his own decisions   Abuse/Neglect Physical Abuse: Denies Verbal Abuse: Denies Sexual Abuse: Denies Exploitation of patient/patient's resources: Denies Self-Neglect: Denies  Emotional Status Pt's affect, behavior adn adjustment status: Pt is motivated and willing to do what he needs to do to regain his independence.  He wants to get back to his life and it has been one thing after another.  He is ready to get home and back to work when he is ready. Recent Psychosocial Issues: Other medical  issues-recent MI and recovery from this. Pyschiatric History: No history-deferred depression screen due to pt tired from therapies and wanting to rest.  He would benefit from Neuro-psych eval due to young age and all that has  happended to him in a short time. Substance Abuse History: No issues  Patient / Family Perceptions, Expectations & Goals Pt/Family understanding of illness & functional limitations: Pt is able to explain his heart attack and surgery along with his stroke and deficits.  He is ready to recover and get back to his self.  He reports: " I feel so out of control with all of this."  He has a good understanding of his condition and treatment plan. Premorbid pt/family roles/activities: Father, Employee, friend, etc Anticipated changes in roles/activities/participation: Plans to resume at discharge Pt/family expectations/goals: Pt states: " I want to be able to take care of myself before I leave here."  [employer]: None Premorbid Home Care/DME Agencies: None Transportation available at discharge: [employer] referrals recommended: Support group (specify) (CVA Support group)  Discharge Planning Living Arrangements: Children Support Systems: Children;Other relatives;Friends/neighbors Type of Residence: Private residence Insurance Resources: Medicaid (specify county) (Pending Medicaid) Financial Resources: Employment Financial Screen Referred: Yes Living Expenses: Psychologist, sport and exercise Management: Patient Does the patient have any problems obtaining your medications?: Yes (Describe) (Has no insurance) Home Management: Self and Tiana Patient/Family Preliminary Plans: Return home with Tiana providing care if necessary.  Pt is doing well and hopeful he will not need much assistance.  Bennie Hind is available and willing to assist him. Social Work Anticipated Follow Up Needs: HH/OP;Support Group  Clinical Impression Pleasant gentleman who is motivated and willing to work hard in therapies to achieve his goals.  His step-daughter-Tiana is supportive and willing to help him at discharge. Will try to get a PCP set up prior discharge.    [name] 02/05/2013, 3:08 PM

## 2013-02-05 NOTE — Care Management Note (Signed)
[facility] Individual Statement of Services  Patient Name:  [name]  Date:  02/05/2013  Welcome to the [facility]patient [facility].  Our goal is to provide you with an individualized program based on your diagnosis and situation, designed to meet your specific needs.  With this comprehensive rehabilitation program, you will be expected to participate in at least 3 hours of rehabilitation therapies Monday-Friday, with modified therapy programming on the weekends.  Your rehabilitation program will include the following services:  Physical Therapy (PT), Occupational Therapy (OT), Speech Therapy (ST), 24 hour per day rehabilitation nursing, Neuropsychology, Case Management (Social Worker), Rehabilitation Medicine, Nutrition Services and Pharmacy Services  Weekly team conferences will be held on Wednesday to discuss your progress.  Your Social Worker will talk with you frequently to get your input and to update you on team discussions.  Team conferences with you and your family in attendance may also be held.  Expected length of stay: 2.5-3 weeks  Overall anticipated outcome: supervision level  Depending on your progress and recovery, your program may change. Your Social Worker will coordinate services and will keep you informed of any changes. Your Social Worker's name and contact numbers are listed  below.  The following services may also be recommended but are not provided by the [facility]patient [facility]:   Driving Evaluations  Home Health Rehabiltiation Services  Outpatient Rehabilitation Services  Vocational Rehabilitation   Arrangements will be made to provide these services after discharge if needed.  Arrangements include referral to agencies that provide these services.  Your insurance has been verified to be:  Pending Medicaid Your primary doctor is:  None  Pertinent information will be shared with your doctor and your insurance  company.  Social Worker:  Dossie Der, SW [phone] or ([phone]  Information discussed with and copy given to patient by: [name], 02/05/2013, 11:46 AM

## 2013-02-05 NOTE — Progress Notes (Signed)
Occupational Therapy Assessment and Plan  Patient Details  Name: [name] MRN: [id number] Date of Birth: 03/24/1961  OT Diagnosis: cognitive deficits, disturbance of vision, hemiplegia affecting non-dominant side and muscle weakness (generalized) Rehab Potential:   Good  ELOS:   2.5-3 weeks   Today's Date: 02/05/2013 Time: 0900-0955 Time Calculation (min): 55 min  Problem List:  Patient Active Problem List   Diagnosis Date Noted  . Leucocytosis 02/05/2013  . Acute blood loss anemia 02/05/2013  . CVA (cerebral infarction) 01/28/2013  . Pericardial tamponade 01/23/2013  . Acute pericarditis, unspecified 01/22/2013  . Fever 01/22/2013  . Chest pain 01/21/2013  . Hypokalemia 01/07/2013  . Non-STEMI (non-ST elevated myocardial infarction) 01/05/2013    Past Medical History:  Past Medical History  Diagnosis Date  . Coronary artery disease     a. s/p MI in 2009 in [place] with stenting of the LCX and LAD;  b. 12/2012 NSTEMI/CAD: LM nl, LAD patent prox stent, LCX 50-70 isr (FFR 0.84), RCA dom, 161m, EF 40-45%, sev basal to mid inf HK to AK-->Med Rx.  . Tobacco abuse     a. quit 12/2012.  . Ischemic cardiomyopathy     a. 12/2012 Ech: EF 40-45%, Gr 1 DD.  Marland Kitchen Hyperlipidemia    Past Surgical History:  Past Surgical History  Procedure Laterality Date  . Coronary angioplasty with stent placement  2000's X 2    "1 + 1" (01/06/2013)  . Cardiac catheterization  01/06/2013  . Coronary artery bypass graft N/A 01/28/2013    Procedure: CORONARY ARTERY BYPASS GRAFTING (CABG);  Surgeon: Loreli Slot, MD;  Location: [facility] OR;  Service: Open Heart Surgery;  Laterality: N/A;  CABG x one, using left greater saphenous vein harvested endoscopically  . Intraoperative transesophageal echocardiogram N/A 01/28/2013    Procedure: INTRAOPERATIVE TRANSESOPHAGEAL ECHOCARDIOGRAM;  Surgeon: Loreli Slot, MD;  Location: [facility] OR;  Service: Open Heart Surgery;  Laterality: N/A;  . Ventricular aneurysm  resection N/A 01/28/2013    Procedure: LEFT VENTRICULAR ANEURYSM REPAIR;  Surgeon: Loreli Slot, MD;  Location: [facility] OR;  Service: Open Heart Surgery;  Laterality: N/A;    Assessment & Plan Clinical Impression: Patient is a 51 y.o. year old male with with history of CAD and recent NSTEMI 01/05/13 treated medically. He was readmitted on 01/31/13 with recurrent chest pain due to acute pericarditis and cardiac tamponade (Dressler syndrome). He underwent pericardiocentesis 10/11 with improvement in symptoms and drain removed 10/14. On 10/15, he developed LUE sensory loss with left hand weakness and ataxia,left visual deficits. He was noted to be hypotensive, hypoxic with fever and chills. He was intubated and started on broad spectrum antibiotics for sepsis and CT head negative. Repeat echo showed a complex effusion with RV collapse and tamponade. He was taken to OR the same day for CABG X1 with repair of LV ruptured inferoposterior pseudoaneurysm by Dr. Dorris Fetch.  Dr. Luciana Axe and Dr. Pearlean Brownie consulted for input. CTA head/neck with New acute/ subacute nonhemorrhagic infarcts involving the left superior cerebellum, the posterior and medial left parietal lobe and lateral left occipital lobe, the higher left frontal lobe, the posterior right frontal lobe and corona radiata. He was extubated without difficulty. Neurology feels that patient with embolic stroke on unknown source but suspected to be due to sepsis. Antibiotics narrowed to imipenem with recommendations for 14 total days antibiotics--stop date 10/29. Patient transferred to CIR on 02/04/2013   Patient currently requires total with basic self-care skills and mod assist for functional transfers secondary  to muscle weakness, decreased cardiorespiratoy endurance, unbalanced muscle activation, decreased coordination and LUE hemiplegia and decreased motor planning.  Prior to hospitalization, patient could complete BADLs with independent .  Patient will  benefit from skilled intervention to increase independence with basic self-care skills prior to discharge home with step daughter (32 years old).  Anticipate patient will require 24 hour supervision and follow up home health.      Skilled Therapeutic Intervention Therapy session focused on adherence to sternal precautions, functional transfers, use of LUE, functional ambulation, and activity tolerance during ADL retraining. Pt requesting to complete sponge bath while sitting EOB. Educated on on sternal precautions prior as pt unaware of them. Completed supine>sit with mod assist. Provided HOH with LUE to wash RUE. Completed sit<>stand with mod assist and min assist for standing balance. Pt with total assist for LB dressing while standing with min-mod assist. Pt ambulated bed>toilet with increased time and mod assist. Min verbal cues provided to adhere to sternal precautions during sit<>stand from toilet. Pt required total assist for toilet task and mod-min assist for standing balance. Completed stand pivot transfer back to w/c with mod assist. Pt reporting dizziness and requesting to lie down. Completed stand pivot transfer w/c>bed with mod assist and mod assist for sit>supine. Pt required frequent rest breaks throughout therapy session secondary to fatigue. Pt left with all items in reach.   OT Evaluation Precautions/Restrictions  Precautions Precautions: Fall;Sternal Precaution Comments: decreased recall of sternal precautions Restrictions Weight Bearing Restrictions: No General   Vital Signs   Pain No c/o pain during therapy session.   Home Living/Prior Functioning   ADL   Vision/Perception     Cognition   Sensation   Motor    Mobility     Trunk/Postural Assessment     Balance   Extremity/Trunk Assessment      FIM:  FIM - Grooming Grooming Steps: Wash, rinse, dry hands;Wash, rinse, dry face Grooming: 5: Set-up assist to obtain items FIM - Bathing Bathing Steps  Patient Completed: Chest;Left Arm;Abdomen;Right upper leg;Left upper leg Bathing: 3: Mod-Patient completes 5-7 11f 10 parts or 50-74% FIM - Upper Body Dressing/Undressing Upper body dressing/undressing: 0: Wears gown/pajamas-no public clothing FIM - Lower Body Dressing/Undressing Lower body dressing/undressing: 1: Total-Patient completed less than 25% of tasks FIM - Toileting Toileting: 1: Total-Patient completed zero steps, helper did all 3 FIM - Bed/Chair Transfer Bed/Chair Transfer: 3: Supine > Sit: Mod A (lifting assist/Pt. 50-74%/lift 2 legs;3: Sit > Supine: Mod A (lifting assist/Pt. 50-74%/lift 2 legs);3: Bed > Chair or W/C: Mod A (lift or lower assist);3: Chair or W/C > Bed: Mod A (lift or lower assist) FIM - Toilet Transfers Toilet Transfers: 3-To toilet/BSC: Mod A (lift or lower assist);3-From toilet/BSC: Mod A (lift or lower assist)   Refer to Care Plan for Long Term Goals  Recommendations for other services: None  Discharge Criteria: Patient will be discharged from OT if patient refuses treatment 3 consecutive times without medical reason, if treatment goals not met, if there is a change in medical status, if patient makes no progress towards goals or if patient is discharged from hospital.  The above assessment, treatment plan, treatment alternatives and goals were discussed and mutually agreed upon: by patient  Daneil Dan 02/05/2013, 10:11 AM

## 2013-02-05 NOTE — Evaluation (Signed)
Speech Language Pathology Assessment and Plan  Patient Details  Name: [name] MRN: [id number] Date of Birth: [birth year]  SLP Diagnosis: Cognitive Impairments  Rehab Potential: Good ELOS: 2 to 3 weeks   Today's Date: 02/05/2013 Time: 1040-1130 Time Calculation (min): 50 min  Problem List:  Patient Active Problem List   Diagnosis Date Noted  . Leucocytosis 02/05/2013  . Acute blood loss anemia 02/05/2013  . CVA (cerebral infarction) 01/28/2013  . Pericardial tamponade 01/23/2013  . Acute pericarditis, unspecified 01/22/2013  . Fever 01/22/2013  . Chest pain 01/21/2013  . Hypokalemia 01/07/2013  . Non-STEMI (non-ST elevated myocardial infarction) 01/05/2013   Past Medical History:  Past Medical History  Diagnosis Date  . Coronary artery disease     a. s/[place] in 2009 in [place] with stenting of the LCX and LAD;  b. 12/2012 NSTEMI/CAD: LM nl, LAD patent prox stent, LCX 50-70 isr (FFR 0.84), RCA dom, 166m, EF 40-45%, sev basal to mid inf HK to AK-->Med Rx.  . Tobacco abuse     a. quit 12/2012.  . Ischemic cardiomyopathy     a. 12/2012 Ech: EF 40-45%, Gr 1 DD.  Marland Kitchen Hyperlipidemia    Past Surgical History:  Past Surgical History  Procedure Laterality Date  . Coronary angioplasty with stent placement  2000's X 2    "1 + 1" (01/06/2013)  . Cardiac catheterization  01/06/2013  . Coronary artery bypass graft N/A 01/28/2013    Procedure: CORONARY ARTERY BYPASS GRAFTING (CABG);  Surgeon: Loreli Slot, MD;  Location: [facility] OR;  Service: Open Heart Surgery;  Laterality: N/A;  CABG x one, using left greater saphenous vein harvested endoscopically  . Intraoperative transesophageal echocardiogram N/A 01/28/2013    Procedure: INTRAOPERATIVE TRANSESOPHAGEAL ECHOCARDIOGRAM;  Surgeon: Loreli Slot, MD;  Location: [facility] OR;  Service: Open Heart Surgery;  Laterality: N/A;  . Ventricular aneurysm resection N/A 01/28/2013    Procedure: LEFT VENTRICULAR ANEURYSM REPAIR;  Surgeon: Loreli Slot, MD;  Location: [facility] OR;  Service: Open Heart Surgery;  Laterality: N/A;    Assessment / Plan / Recommendation Clinical Impression  Patient is a 52 y.o. year old male with with history of CAD and recent NSTEMI 01/05/13 treated medically. He was readmitted on 01/31/13 with recurrent chest pain due to acute pericarditis and cardiac tamponade (Dressler syndrome). He underwent pericardiocentesis 10/11 with improvement in symptoms and drain removed 10/14. On 10/15, he developed LUE sensory loss with left hand weakness and ataxia,left visual deficits. He was noted to be hypotensive, hypoxic with fever and chills. He was intubated and started on broad spectrum antibiotics for sepsis and CT head negative. Repeat echo showed a complex effusion with RV collapse and tamponade. He was taken to OR the same day for CABG X1 with repair of LV ruptured inferoposterior pseudoaneurysm by Dr. Dorris Fetch.  Dr. Luciana Axe and Dr. Pearlean Brownie consulted for input. CTA head/neck with New acute/ subacute nonhemorrhagic infarcts involving the left superior cerebellum, the posterior and medial left parietal lobe and lateral left occipital lobe, the higher left frontal lobe, the posterior right frontal lobe and corona radiata. He was extubated without difficulty. Neurology feels that patient with embolic stroke on unknown source but suspected to be due to sepsis. Antibiotics narrowed to imipenem with recommendations for 14 total days antibiotics--stop date 10/29. Patient transferred to CIR on 02/04/2013.  Cognitive-linguistic evaluation completed on 02/05/2013.  Patient presents with mild high level cognitive impairments characterized by impaired alternating attention, self-monitoring, and self-correcting.   Patient also  demonstrated decreased working memory and recall of new information as well as impaired complex problem solving.  Patient reports that he speaks slower post CVA. SLP suspects his need for increased processing time to  comprehend and organize message largely impacts his perceptions of these abilities; patient currently communicating Mod I.  No other speech or language deficits were noted or reported.  Previous to his CVA, patient was living independently with his adult step-daughter and working full time as a Midwife.  It is recommended that patient receive skilled SLP intervention focusing on complex functional tasks to address cognitive deficits in order to maximize his overall safety and functional independence prior to discharge.    SLP Assessment  Patient will need skilled Speech Lanaguage Pathology Services during CIR admission    Recommendations  Diet Recommendations: Regular;Thin liquid Liquid Administration via: Cup;Straw Medication Administration: Whole meds with liquid Supervision: Patient able to self feed;Intermittent supervision to cue for compensatory strategies (possible help with setup) Compensations: Slow rate;Small sips/bites Postural Changes and/or Swallow Maneuvers: Seated upright 90 degrees Oral Care Recommendations: Oral care BID Patient destination: Home Follow up Recommendations: Outpatient SLP Equipment Recommended: None recommended by SLP    SLP Frequency 5 out of 7 days   SLP Treatment/Interventions Cognitive remediation/compensation;Cueing hierarchy;Functional tasks;Internal/external aids;Patient/family education    Pain Pain Assessment Pain Assessment: No/denies pain Pain Score: 0-No pain Prior Functioning Cognitive/Linguistic Baseline: Within functional limits Type of Home: Other(Comment) (split level)  Lives With: Other (Comment) (9 year old stepdaughter) Available Help at Discharge: Family;Available 24 hours/day Education: Midwife from [place] to [employer]: Full time employment  Short Term Goals: Week 1: SLP Short Term Goal 1 (Week 1): Patient will solve functional complex problems with Min verbal cues. SLP Short Term Goal 2 (Week 1): Patient will utilize  external memory aids with Supervision cues. SLP Short Term Goal 3 (Week 1): Patient will demonstrate alternating attention during a structured task for 10 minutes with Min cues. SLP Short Term Goal 4 (Week 1): Patient will self-monitor himself during a structured task with Min cues.  See FIM for current functional status Refer to Care Plan for Long Term Goals  Recommendations for other services: None  Discharge Criteria: Patient will be discharged from SLP if patient refuses treatment 3 consecutive times without medical reason, if treatment goals not met, if there is a change in medical status, if patient makes no progress towards goals or if patient is discharged from hospital.  The above assessment, treatment plan, treatment alternatives and goals were discussed and mutually agreed upon: by patient and by family  Levora Angel 02/05/2013, 12:18 PM

## 2013-02-05 NOTE — Progress Notes (Signed)
Social visit  [name] is in good spirits  BP 122/82  Pulse 102  Temp(Src) 98.3 F (36.8 C) (Oral)  Resp 20  Ht 6\' 1"  (1.854 m)  Wt 228 lb 2.8 oz (103.5 kg)  BMI 30.11 kg/m2  SpO2 95%\\  Wounds healing well  He is very motivated. Should do well with rehab

## 2013-02-05 NOTE — Evaluation (Signed)
The assessment and plan has been reviewed and SLP is in agreement. Shayden Bobier, M.A., CCC-SLP [phone]  

## 2013-02-05 NOTE — Evaluation (Signed)
Physical Therapy Assessment and Plan  Patient Details  Name: [name] MRN: [id number] Date of Birth: [birth year]  PT Diagnosis: Abnormal posture, Abnormality of gait, Difficulty walking, Hemiparesis non-dominant and Muscle weakness  Rehab Potential: Good ELOS: 2.5-3 weeks    Today's Date: 02/05/2013 Time: 4098-1191 Time Calculation (min): 60 min  Problem List:  Patient Active Problem List   Diagnosis Date Noted  . Leucocytosis 02/05/2013  . Acute blood loss anemia 02/05/2013  . CVA (cerebral infarction) 01/28/2013  . Pericardial tamponade 01/23/2013  . Acute pericarditis, unspecified 01/22/2013  . Fever 01/22/2013  . Chest pain 01/21/2013  . Hypokalemia 01/07/2013  . Non-STEMI (non-ST elevated myocardial infarction) 01/05/2013    Past Medical History:  Past Medical History  Diagnosis Date  . Coronary artery disease     a. s/p MI in 2009 in [place] with stenting of the LCX and LAD;  b. 12/2012 NSTEMI/CAD: LM nl, LAD patent prox stent, LCX 50-70 isr (FFR 0.84), RCA dom, 141m, EF 40-45%, sev basal to mid inf HK to AK-->Med Rx.  . Tobacco abuse     a. quit 12/2012.  . Ischemic cardiomyopathy     a. 12/2012 Ech: EF 40-45%, Gr 1 DD.  Marland Kitchen Hyperlipidemia    Past Surgical History:  Past Surgical History  Procedure Laterality Date  . Coronary angioplasty with stent placement  2000's X 2    "1 + 1" (01/06/2013)  . Cardiac catheterization  01/06/2013  . Coronary artery bypass graft N/A 01/28/2013    Procedure: CORONARY ARTERY BYPASS GRAFTING (CABG);  Surgeon: Loreli Slot, MD;  Location: [facility] OR;  Service: Open Heart Surgery;  Laterality: N/A;  CABG x one, using left greater saphenous vein harvested endoscopically  . Intraoperative transesophageal echocardiogram N/A 01/28/2013    Procedure: INTRAOPERATIVE TRANSESOPHAGEAL ECHOCARDIOGRAM;  Surgeon: Loreli Slot, MD;  Location: [facility] OR;  Service: Open Heart Surgery;  Laterality: N/A;  . Ventricular aneurysm resection N/A  01/28/2013    Procedure: LEFT VENTRICULAR ANEURYSM REPAIR;  Surgeon: Loreli Slot, MD;  Location: [facility] OR;  Service: Open Heart Surgery;  Laterality: N/A;    Assessment & Plan Clinical Impression: Patient is a 52 y.o. year old male history of CAD and recent NSTEMI 01/05/13 treated medically. He was readmitted on 01/31/13 with recurrent chest pain due to acute pericarditis and cardiac tamponade (Dressler syndrome). He underwent pericardiocentesis 10/11 with improvement in symptoms and drain removed 10/14. On 10/15, he developed LUE sensory loss with left hand weakness and ataxia,left visual deficits. He was noted to be hypotensive, hypoxic with fever and chills. He was intubated and started on broad spectrum antibiotics for sepsis and CT head negative. Repeat echo showed a complex effusion with RV collapse and tamponade. He was taken to OR the same day for CABG X1 with repair of LV ruptured inferoposterior pseudoaneurysm by Dr. Dorris Fetch.  Dr. Luciana Axe and Dr. Pearlean Brownie consulted for input. CTA head/neck with New acute/ subacute nonhemorrhagic infarcts involving the left superior cerebellum, the posterior and medial left parietal lobe and lateral left occipital lobe, the higher left frontal lobe, the posterior right frontal lobe and corona radiata. He was extubated without difficulty. Neurology feels that patient with embolic stroke on unknown source but suspected to be due to sepsis. Antibiotics narrowed to imipenem with recommendations for 14 total days antibiotics--stop date 10/29.  Swallow evaluation done yesterday and patient started on regular diet. Therapies ongoing and CIR recommended by rehab team and [place].    Patient transferred to CIR on  02/04/2013 .   Patient currently requires mod with mobility secondary to muscle weakness, decreased cardiorespiratoy endurance, impaired timing and sequencing, unbalanced muscle activation, decreased coordination and decreased motor planning, decreased attention  and decreased sitting balance, decreased standing balance, decreased postural control, hemiplegia, decreased balance strategies and difficulty maintaining precautions.  Prior to hospitalization, patient was independent  with mobility and lived with Family (step-daughter) in a Apartment (split level ) home.  Home access is 3Level entry.  Patient will benefit from skilled PT intervention to maximize safe functional mobility, minimize fall risk and decrease caregiver burden for planned discharge home with 24 hour supervision.  Anticipate patient will benefit from follow up OP at discharge.  PT - End of Session Activity Tolerance: Tolerates 30+ min activity with multiple rests Endurance Deficit: Yes PT Assessment Rehab Potential: Excellent PT Patient demonstrates impairments in the following area(s): Balance;Endurance;Motor;Pain;Safety PT Transfers Functional Problem(s): Bed Mobility;Bed to Chair;Car;Furniture PT Locomotion Functional Problem(s): Ambulation;Wheelchair Mobility;Stairs PT Plan PT Intensity: Minimum of 1-2 x/day ,45 to 90 minutes PT Frequency: 5 out of 7 days PT Duration Estimated Length of Stay: 2-3 weeks  PT Treatment/Interventions: Ambulation/gait training;Balance/vestibular training;Community reintegration;Discharge planning;Disease management/prevention;DME/adaptive equipment instruction;Functional mobility training;Neuromuscular re-education;Pain management;Patient/family education;Psychosocial support;Splinting/orthotics;Stair training;Therapeutic Activities;Therapeutic Exercise;UE/LE Strength taining/ROM;UE/LE Coordination activities;Wheelchair propulsion/positioning PT Transfers Anticipated Outcome(s): supervision level for all transfers  PT Locomotion Anticipated Outcome(s): supervision level for gait, stairs, and wheelchair mobility.  PT Recommendation Follow Up Recommendations: Outpatient PT Patient destination: Home  Skilled Therapeutic Intervention Patient received  supine in bed, fiance and nursing present. Skilled physical therapy initiated following completion of evaluation. See Details below. Patient educated on sternal precautions and was able to repeat back what was taught. Trial patient without quick release belt, patient verbalized understanding that to transfer in/out of bed, move around room, or use bathroom he needs to call nursing. Ended session with patient supine in bed, bed alarm on and all needs within reach.   PT Evaluation Precautions/Restrictions Precautions Precautions: Sternal;Fall Restrictions Weight Bearing Restrictions: No General Chart Reviewed: Yes  Vital SignsTherapy Vitals Pulse Rate: 115 Resp: 18 BP: 99/58 mmHg Patient Position, if appropriate: Sitting Oxygen Therapy SpO2: 98 % O2 Device: None (Room air) Pain Pain Assessment Pain Assessment: No/denies pain Pain Score: 0-No pain Home Living/Prior Functioning Home Living Living Arrangements: Children Available Help at Discharge: Family;Available 24 hours/day (step daughter) Type of Home: Apartment (split level ) Home Access: Level entry Entrance Stairs-Rails:  (stairs to second floor. ) Home Layout: Two level;Bed/bath upstairs Alternate Level Stairs-Number of Steps: 10-12 steps to second floor, patients bedroom and full bathroom located on second floor.  Alternate Level Stairs-Rails: Can reach both Additional Comments: Patient reports doors in home are thin and does not think they will fit a wheelchair or a walker.   Lives With: Family (step-daughter) Prior Function Level of Independence: Independent with basic ADLs;Independent with homemaking with ambulation;Independent with gait;Independent with transfers  Able to Take Stairs?: Yes Driving: Yes Vocation: Full time employment Vocation Requirements: [employer] Vision/Perception  Vision - History Baseline Vision: Wears glasses only for reading Patient Visual Report: No change from baseline   Cognition Overall Cognitive Status: Within Functional Limits for tasks assessed Arousal/Alertness: Awake/alert Orientation Level: Oriented X4 Attention: Focused;Sustained Focused Attention: Appears intact Sustained Attention: Appears intact Awareness: Appears intact Safety/Judgment: Appears intact Sensation Sensation Light Touch: Appears Intact Proprioception: Appears Intact Coordination Gross Motor Movements are Fluid and Coordinated: No Fine Motor Movements are Fluid and Coordinated: No Coordination and Movement Description: Rapid alternating movements B UE  decreased speed and L UE unable to perform full pronation>supination movement, secondary to L hemiparesis  Heel Shin Test: no noted dysmetria  Motor  Motor Motor: Hemiplegia;Abnormal postural alignment and control (L hemiparesis ) Motor - Skilled Clinical Observations: Patient has L hemiparesis L UE> L LE.   Mobility Bed Mobility Bed Mobility: Supine to Sit;Sit to Supine Supine to Sit: 3: Mod assist;HOB elevated Supine to Sit Details: Verbal cues for technique;Verbal cues for precautions/safety;Visual cues/gestures for sequencing;Manual facilitation for placement Supine to Sit Details (indicate cue type and reason): Supine to sit HOB elevated modA, verbal reminder of sternal precautions, with manual assist for trunk/shoulder movement to upright position.  Sit to Supine: 3: Mod assist;HOB elevated Sit to Supine - Details: Tactile cues for sequencing;Verbal cues for technique;Verbal cues for sequencing;Verbal cues for precautions/safety;Manual facilitation for placement Sit to Supine - Details (indicate cue type and reason): Patient sit>supine modA, verbal reminder of sternal precautions, manual facilitation for lifting of B LEs onto bed, and guiding of trunk for proper supine positioing.  Transfers Transfers: Yes Sit to Stand: 4: Min assist Sit to Stand Details: Verbal cues for sequencing;Verbal cues for technique;Verbal cues  for precautions/safety Sit to Stand Details (indicate cue type and reason): Patient instructed sit>stand from wheelchair, verbal reminder to maintain B UEs in lap during trasnfer and not to place them on armrests for pushing up, secondary to sternal precautions.  Stand to Sit: 4: Min assist Stand to Sit Details (indicate cue type and reason): Verbal cues for sequencing;Verbal cues for technique;Verbal cues for precautions/safety Stand to Sit Details: Stand>sit to wheelchair, minA, verbal cues for awareness of sternal precautions (cant reach back for armrests), patient instructed to keep B UEs in lap as a reminder not to use them.  Stand Pivot Transfers: 3: Mod assist Stand Pivot Transfer Details: Verbal cues for sequencing;Verbal cues for technique;Verbal cues for precautions/safety Stand Pivot Transfer Details (indicate cue type and reason): Patient isntructed in stand pivot transfer wheelchair<>bed modA with verbal cues for instruction and for not using hands to stand up/sit down.  Locomotion  Ambulation Ambulation: Yes Ambulation/Gait Assistance: 3: Mod Assist Ambulation Distance (Feet): 20 Feet Assistive device: Other (Comment) (IV pole R UE.) Ambulation/Gait Assistance Details: Verbal cues for technique;Verbal cues for precautions/safety Ambulation/Gait Assistance Details: Patient instructed in gait training 20'x1 modA with IV pole for R UE support, verbal cues for using IV pole for support/stability and not to pull/push on it.  Gait Gait: Yes Gait Pattern: Step-through pattern;Decreased step length - right;Decreased step length - left;Decreased stride length;Decreased dorsiflexion - left;Decreased weight shift to left;Trunk flexed;Narrow base of support Stairs / Additional Locomotion Stairs: Yes Stairs Assistance: 3: Mod assist Stairs Assistance Details: Verbal cues for sequencing;Verbal cues for technique;Verbal cues for precautions/safety Stairs Assistance Details (indicate cue type  and reason): Patient instructed in negotiation of 1 step with 2 handrails modA. Verbal cuing for ascending with uninvolved leg, and descending backwards with involved leg. Verbal cues also provided for using handrails for stability and not to use them to push/pull himself up steps due to sternal precautions.  Stair Management Technique: Two rails;Step to pattern Number of Stairs: 1 Height of Stairs: 7 Wheelchair Mobility Wheelchair Mobility: Yes Wheelchair Assistance: 4: Systems analyst: Both lower extermities Wheelchair Parts Management: Needs assistance Distance: 50  Trunk/Postural Assessment  Cervical Assessment Cervical Assessment: Within Functional Limits Thoracic Assessment Thoracic Assessment: Within Functional Limits Lumbar Assessment Lumbar Assessment: Within Functional Limits Postural Control Postural Control: Deficits on evaluation Postural Limitations: Rounded  shoulders and posterior pelvic tilt, forward head posture  Balance Balance Balance Assessed: Yes Static Sitting Balance Static Sitting - Balance Support: Feet supported Static Sitting - Level of Assistance: 5: Stand by assistance Static Sitting - Comment/# of Minutes: 1 Static Standing Balance Static Standing - Balance Support: Right upper extremity supported;During functional activity Static Standing - Level of Assistance: 4: Min assist Static Standing - Comment/# of Minutes: 15" Extremity Assessment      RLE Assessment RLE Assessment: Exceptions to [facility] RLE Strength RLE Overall Strength: Due to precautions RLE Overall Strength Comments: Grossly 3/5, no resisted testing secondary to sternal precautions.  LLE Assessment LLE Assessment: Exceptions to [facility] LLE Strength LLE Overall Strength: Due to precautions LLE Overall Strength Comments: Grossly 3/5, no resisted testing secondary to sternal precautions.   FIM:  FIM - Bed/Chair Transfer Bed/Chair Transfer: 3: Supine > Sit: Mod A (lifting  assist/Pt. 50-74%/lift 2 legs;3: Sit > Supine: Mod A (lifting assist/Pt. 50-74%/lift 2 legs);3: Bed > Chair or W/C: Mod A (lift or lower assist);3: Chair or W/C > Bed: Mod A (lift or lower assist) FIM - Locomotion: Wheelchair Distance: 50 Locomotion: Wheelchair: 2: Travels 50 - 149 ft with minimal assistance (Pt.>75%) FIM - Locomotion: Ambulation Locomotion: Ambulation Assistive Devices: Other (comment) (IV pole ) Ambulation/Gait Assistance: 3: Mod Assist Locomotion: Ambulation: 1: Travels less than 50 ft with moderate assistance (Pt: 50 - 74%) FIM - Locomotion: Stairs Locomotion: Building control surveyor: Hand rail - 2 Locomotion: Stairs: 1: Up and Down < 4 stairs with moderate assistance (Pt: 50 - 74%)   Refer to Care Plan for Long Term Goals  Recommendations for other services: Speech Therapy  Discharge Criteria: Patient will be discharged from PT if patient refuses treatment 3 consecutive times without medical reason, if treatment goals not met, if there is a change in medical status, if patient makes no progress towards goals or if patient is discharged from hospital.  The above assessment, treatment plan, treatment alternatives and goals were discussed and mutually agreed upon: by patient  Doralee Albino 02/05/2013, 4:37 PM

## 2013-02-06 ENCOUNTER — Inpatient Hospital Stay (HOSPITAL_COMMUNITY): Payer: Medicaid Other | Admitting: *Deleted

## 2013-02-06 ENCOUNTER — Inpatient Hospital Stay (HOSPITAL_COMMUNITY): Payer: Medicaid Other

## 2013-02-06 DIAGNOSIS — I6330 Cerebral infarction due to thrombosis of unspecified cerebral artery: Secondary | ICD-10-CM

## 2013-02-06 DIAGNOSIS — I2510 Atherosclerotic heart disease of native coronary artery without angina pectoris: Secondary | ICD-10-CM

## 2013-02-06 NOTE — Progress Notes (Signed)
I have reviewed and agree with the treatment as reflected in this note. Shaquetta Arcos, PT DPT  

## 2013-02-06 NOTE — Progress Notes (Signed)
Speech Language Pathology Daily Session Note  Patient Details  Name: WRANGLER PENNING MRN: [id number] Date of Birth: [birth year]  Today's Date: 02/06/2013 Time: 4098-1191 Time Calculation (min): 45 min  Short Term Goals: Week 1: SLP Short Term Goal 1 (Week 1): Patient will solve functional complex problems with Min verbal cues. SLP Short Term Goal 2 (Week 1): Patient will utilize external memory aids with Supervision cues. SLP Short Term Goal 3 (Week 1): Patient will demonstrate alternating attention during a structured task for 10 minutes with Min cues. SLP Short Term Goal 4 (Week 1): Patient will self-monitor himself during a structured task with Min cues.  Skilled Therapeutic Interventions: Treatment focused on cognitive goals. SLP facilitated session with money management tasks. Initial attempt at checkbook balancing task required Total A for problem solving, so treatment focused on counting money and making change. Pt required Mod faded to Min cues for accuracy with making change. Continue plan of care.   FIM:  Comprehension Comprehension Mode: Auditory Comprehension: 5-Follows basic conversation/direction: With extra time/assistive device Expression Expression Mode: Verbal Expression: 5-Expresses basic needs/ideas: With extra time/assistive device Social Interaction Social Interaction: 5-Interacts appropriately 90% of the time - Needs monitoring or encouragement for participation or interaction. Problem Solving Problem Solving: 4-Solves basic 75 - 89% of the time/requires cueing 10 - 24% of the time Memory Memory: 4-Recognizes or recalls 75 - 89% of the time/requires cueing 10 - 24% of the time FIM - Eating Eating Activity: 5: Set-up assist for open containers;5: Set-up assist for cut food;6: Assistive device: dentures  Pain Pain Assessment Pain Assessment: No/denies pain  Therapy/Group: Individual Therapy  Maxcine Ham 02/06/2013, 4:05 PM  Maxcine Ham, M.A.  CCC-SLP [phone]

## 2013-02-06 NOTE — Progress Notes (Signed)
I have reviewed and agree with the treatment as reflected in this note. Elexia Friedt, PT DPT  

## 2013-02-06 NOTE — Progress Notes (Signed)
Physical Therapy Note  Patient Details  Name: MACARI ZALESKY MRN: [id number] Date of Birth: 03-Apr-1961 Today's Date: 02/06/2013  Patient missed 30 minutes skilled physical therapy secondary to feeling fatigued and "sluggish" this afternoon. Patient repositioned in bed with L arm supported on pillows. Patient agreeable to participate in therapy this weekend. Patient left supine in bed with all needs within reach, bed alarm on, and fiance present. Will follow up as able.    Doralee Albino 02/06/2013, 3:25 PM

## 2013-02-06 NOTE — Progress Notes (Signed)
Subjective/Complaints: 52 y.o. male with history of CAD and recent NSTEMI 01/05/13 treated medically. He was readmitted on 01/31/13 with recurrent chest pain due to acute pericarditis and cardiac tamponade (Dressler syndrome). He underwent pericardiocentesis 10/11 with improvement in symptoms and drain removed 10/14. On 10/15, he developed LUE sensory loss with left hand weakness and ataxia,left visual deficits. He was noted to be hypotensive, hypoxic with fever and chills. He was intubated and started on broad spectrum antibiotics for sepsis and CT head negative. Repeat echo showed a complex effusion with RV collapse and tamponade. He was taken to OR the same day for CABG X1 with repair of LV ruptured inferoposterior pseudoaneurysm by Dr. Dorris Fetch.  Dr. Luciana Axe and Dr. Pearlean Brownie consulted for input. CTA head/neck with New acute/ subacute nonhemorrhagic infarcts involving the left superior cerebellum, the posterior and medial left parietal lobe and lateral left occipital lobe, the higher left frontal lobe, the posterior right frontal lobe and corona radiata. He was extubated without difficulty. Neurology feels that patient with embolic stroke on unknown source but suspected to be due to sepsis. Antibiotics narrowed to imipenem with recommendations for 14 total days antibiotics--stop date 10/29  Slept well, no issues with therapy Review of Systems - Negative except Left arm weakness  Objective: Vital Signs: Blood pressure 118/71, pulse 98, temperature 99 F (37.2 C), temperature source Oral, resp. rate 17, height 6\' 1"  (1.854 m), weight 103.148 kg (227 lb 6.4 oz), SpO2 96.00%. Dg Chest Port 1 View  02/04/2013   CLINICAL DATA:  Post CABG, repair of left ventricular pseudoaneurysm  EXAM: PORTABLE CHEST - 1 VIEW  COMPARISON:  Portable chest x-ray of 02/03/2013  FINDINGS: Moderate cardiomegaly is stable with perhaps minimal pulmonary vascular congestion and basilar volume loss. Small effusions cannot be  excluded. A right IJ central venous line is unchanged in position overlying the lower SVC. Median sternotomy sutures are noted.  IMPRESSION: Stable cardiomegaly. Minimal basilar atelectasis with questionable mild pulmonary vascular congestion.   Electronically Signed   By: Dwyane Dee M.D.   On: 02/04/2013 09:14   Results for orders placed during the hospital encounter of 02/04/13 (from the past 72 hour(s))  CBC WITH DIFFERENTIAL     Status: Abnormal   Collection Time    02/05/13  5:00 AM      Result Value Range   WBC 13.4 (*) 4.0 - 10.5 K/uL   RBC 3.03 (*) 4.22 - 5.81 MIL/uL   Hemoglobin 8.8 (*) 13.0 - 17.0 g/dL   HCT 09.8 (*) 11.9 - 14.7 %   MCV 90.1  78.0 - 100.0 fL   MCH 29.0  26.0 - 34.0 pg   MCHC 32.2  30.0 - 36.0 g/dL   RDW 82.9 (*) 56.2 - 13.0 %   Platelets 406 (*) 150 - 400 K/uL   Neutrophils Relative % 73  43 - 77 %   Lymphocytes Relative 19  12 - 46 %   Monocytes Relative 8  3 - 12 %   Eosinophils Relative 0  0 - 5 %   Basophils Relative 0  0 - 1 %   Neutro Abs 9.8 (*) 1.7 - 7.7 K/uL   Lymphs Abs 2.5  0.7 - 4.0 K/uL   Monocytes Absolute 1.1 (*) 0.1 - 1.0 K/uL   Eosinophils Absolute 0.0  0.0 - 0.7 K/uL   Basophils Absolute 0.0  0.0 - 0.1 K/uL   RBC Morphology POLYCHROMASIA PRESENT    COMPREHENSIVE METABOLIC PANEL     Status: Abnormal  Collection Time    02/05/13  5:00 AM      Result Value Range   Sodium 132 (*) 135 - 145 mEq/L   Potassium 3.6  3.5 - 5.1 mEq/L   Chloride 97  96 - 112 mEq/L   CO2 24  19 - 32 mEq/L   Glucose, Bld 99  70 - 99 mg/dL   BUN 10  6 - 23 mg/dL   Creatinine, Ser 1.61  0.50 - 1.35 mg/dL   Calcium 8.3 (*) 8.4 - 10.5 mg/dL   Total Protein 6.3  6.0 - 8.3 g/dL   Albumin 2.3 (*) 3.5 - 5.2 g/dL   AST 42 (*) 0 - 37 U/L   ALT 31  0 - 53 U/L   Alkaline Phosphatase 134 (*) 39 - 117 U/L   Total Bilirubin 0.6  0.3 - 1.2 mg/dL   GFR calc non Af Amer >90  >90 mL/min   GFR calc Af Amer >90  >90 mL/min   Comment: (NOTE)     The eGFR has been calculated  using the CKD EPI equation.     This calculation has not been validated in all clinical situations.     eGFR's persistently <90 mL/min signify possible Chronic Kidney     Disease.     HEENT: normal Cardio: RRR and No murmurs or extra sounds Resp: CTA B/L and Unlabored GI: BS positive and Nontender nondistended Extremity:  Pulses positive and Edema Left dorsal hand Skin:   Wound C/D/I and Sternotomy wound is well healed. Drain sites still in completely closed, small amount of dried blood Neuro: Alert/Oriented, Flat, Cranial Nerve II-XII normal, Abnormal Sensory Paresthesias left arm, Normal Motor and Tone:  Hypotonia Musc/Skel:  Other No pain with left shoulder range of motion. General no acute distress Motor strength is 0/5 in the left deltoid, bicep, tricep, grip 4/5 in the left hip flexor, knee extensors, ankle dorsi flexion plantar flexor 5/5 in the right upper and right lower limb  Assessment/Plan: 1. Functional deficits secondary to right posterior frontal and corona radiata infarcts causing left upper extremity weakness, also deconditioned from coronary artery disease status post CABG which require 3+ hours per day of interdisciplinary therapy in a comprehensive inpatient rehab setting. Physiatrist is providing close team supervision and 24 hour management of active medical problems listed below. Physiatrist and rehab team continue to assess barriers to discharge/monitor patient progress toward functional and medical goals. FIM: FIM - Bathing Bathing Steps Patient Completed: Chest;Left Arm;Abdomen;Right upper leg;Left upper leg Bathing: 3: Mod-Patient completes 5-7 70f 10 parts or 50-74%  FIM - Upper Body Dressing/Undressing Upper body dressing/undressing steps patient completed: Pull shirt over trunk Upper body dressing/undressing: 2: Max-Patient completed 25-49% of tasks FIM - Lower Body Dressing/Undressing Lower body dressing/undressing: 1: Total-Patient completed less than 25%  of tasks  FIM - Toileting Toileting: 1: Total-Patient completed zero steps, helper did all 3  FIM - Toilet Transfers Toilet Transfers: 3-To toilet/BSC: Mod A (lift or lower assist);3-From toilet/BSC: Mod A (lift or lower assist)  FIM - Bed/Chair Transfer Bed/Chair Transfer Assistive Devices: HOB elevated Bed/Chair Transfer: 3: Supine > Sit: Mod A (lifting assist/Pt. 50-74%/lift 2 legs;3: Sit > Supine: Mod A (lifting assist/Pt. 50-74%/lift 2 legs);3: Bed > Chair or W/C: Mod A (lift or lower assist);3: Chair or W/C > Bed: Mod A (lift or lower assist)  FIM - Locomotion: Wheelchair Distance: 50 Locomotion: Wheelchair: 2: Travels 50 - 149 ft with minimal assistance (Pt.>75%) FIM - Locomotion: Ambulation Locomotion: Ambulation  Assistive Devices: Other (comment) (IV pole ) Ambulation/Gait Assistance: 4: Min assist Locomotion: Ambulation: 1: Travels less than 50 ft with moderate assistance (Pt: 50 - 74%)  Comprehension Comprehension Mode: Auditory Comprehension: 5-Follows basic conversation/direction: With no assist  Expression Expression Mode: Verbal Expression: 5-Expresses basic needs/ideas: With no assist  Social Interaction Social Interaction: 5-Interacts appropriately 90% of the time - Needs monitoring or encouragement for participation or interaction.  Problem Solving Problem Solving Mode: Asleep Problem Solving: 5-Solves basic 90% of the time/requires cueing < 10% of the time  Memory Memory Mode: Asleep Memory: 4-Recognizes or recalls 75 - 89% of the time/requires cueing 10 - 24% of the time  Medical Problem List and Plan:  1. DVT Prophylaxis/Anticoagulation: Pharmaceutical: Lovenox  2. Pain Management: prn medications effective.  3. Mood: Provide ego support--has supportive girlfriend. Will have LCSW follow for evaluation.  4. Neuropsych: This patient is capable of making decisions on his own behalf.  5. Pericarditis: Continue imipenem through 02/11/13. Leucocytosis  resolving.  6. Antibiotic associated diarrhea: C-diff check 10/21 was negative. Will add probiotics and fiber to help with symptoms.  7. ABLA: Add iron supplement  8. CAD/s/p CABG X 1: sternal precautions.Appreciate CVTS note  Continue ASA/plavix, lisinopril, zocor and metoprolol    LOS (Days) 2 A FACE TO FACE EVALUATION WAS PERFORMED  [name] 02/06/2013, 7:36 AM

## 2013-02-06 NOTE — Progress Notes (Signed)
Physical Therapy Session Note  Patient Details  Name: [name] MRN: [id number] Date of Birth: 09/12/1960  Today's Date: 02/06/2013 Time: 4098-1191 Time Calculation (min): 57 min  Short Term Goals: Week 1:  PT Short Term Goal 1 (Week 1): Patient will perform bed mobility minA.  PT Short Term Goal 2 (Week 1): Patient will perform stand pivot transfers minA.  PT Short Term Goal 3 (Week 1): Patient will perform wheelchair mobility 100' supervision level with B LE.  PT Short Term Goal 4 (Week 1): Patient will ambulate 34' minA with LRAD.  PT Short Term Goal 5 (Week 1): Patient will negotiate 3 steps minA.   Skilled Therapeutic Interventions/Progress Updates:    Patient received seated in wheelchair, nursing present. Session focused on bed mobility, functional transfer, and gait training. See below for gait training details. Patient able to recall 3/4 sternal precautions. Patients vitals taken 3 times during session, following each gait training trial. First vitals taken seated in wheelchair HR 110 O2 sats 98%, second vitals seated in wheelchair HR 107, O2 98, Final vitals taken seated on mat table BP 108/75, HR 115, O2 initially 88, instructed in deep breathing O2 sats increasd to 96%. Patient requested rest break, sit>supine on mat table modA with assist for control of trunk into L sidelying, patient able to lift B LE onto mat without assistance. Patient instructed in sit<>stand from elevated mat table x3 minA, with no B UE (secondary to sternal precautions). Patient instructed to maintain hand position in front of him when tranferring sit>stand with verbal cues for increased anterior weight shift to assist standing, verbal cues for slow eccentric control when stand>sit.  Patient returned to room, stand pivot transfer (no AD) wheelchair>bed (L side) minA. Sit>supine modA with assistance for trunk control and lifting B LEs onto bed. Ended session patient supine in bed, bed alarm on and all needs  within reach.   Therapy Documentation Precautions:  Precautions Precautions: Sternal;Fall Precaution Comments: decreased recall of sternal precautions Restrictions Weight Bearing Restrictions: No   Pain: Pain Assessment Pain Assessment: No/denies pain Pain Score: 0-No pain   Locomotion : Ambulation Ambulation: Yes Ambulation/Gait Assistance: 4: Min assist Ambulation Distance (Feet): 14 Feet (24'x1 and 34'x1) Assistive device: Hemi-walker;Large base quad cane Ambulation/Gait Assistance Details: Visual cues/gestures for sequencing;Verbal cues for sequencing;Verbal cues for technique;Verbal cues for precautions/safety;Verbal cues for gait pattern;Verbal cues for safe use of DME/AE Ambulation/Gait Assistance Details: Patient sit<>stand transfers during gait training minA with verbal cues for hand placement in lap to prevent pushing/pulling up with UEs secondary to sternal precautions. Patient instructed in gait training 14'x1 hemiwalker minA step through gait pattern, verbal cues and visual demonstration for sequencing with AD, verbal cues for increased B step length. Second trial gait training 24'x1 with [facility] minA, verbal cues for sequencing and for increaed B step length. Final gait training 34'x1 [facility] patient able to demonstrate carry over of verbal cues, with increased B step length. Patient demonstrated a more normalized gait pattern, step through with increased B step length when using the [facility] rather than the hemi walker.    See FIM for current functional status  Therapy/Group: Individual Therapy  Doralee Albino 02/06/2013, 12:16 PM

## 2013-02-06 NOTE — Progress Notes (Signed)
Occupational Therapy Note   Patient Details  Name: AMAAN MEYER MRN: [id number] Date of Birth: 08-01-1960 Today's Date: 02/06/2013  Time:0900-0910  (70 min0 Pain:none, but stated he was dizzy when sitting on side of bed and standing. Individual session  Pt. [name] upon OT arrival.  He stated he needed to go to bathroom.  Performed bed mobility, stand pivot transfers, standing balance, endurance activities.  Pt reported 2/4 sternal precautions.  Transferred to wc>toilet>wc with mod assist.  Had BM.  Pt cleaned bottom but not thoroughly.  Stood using grab bar with cues to use legs and trunk to stand and not arm to push.  Performed sit to stand x4 during bathing/dressing.  Needed rest break after each stand.  Pt. Also reported he felt dizzy but able to continue. Used LUE with HOH assist for bathing right UE and applying deodorant.  Has trace deltoids with about 10 degrees of shoulder abduction.   Left pt in wc with safety belt on and call bell,phone within reach.        Humberto Seals 02/06/2013, 10:52 AM

## 2013-02-06 NOTE — Progress Notes (Signed)
ANTIBIOTIC CONSULT NOTE - FOLLOW UP  Pharmacy Consult for Primaxin Indication: Acute pericarditis  No Known Allergies  Patient Measurements: Height: 6\' 1"  (185.4 cm) Weight: 227 lb 6.4 oz (103.148 kg) IBW/kg (Calculated) : 79.9 Adjusted Body Weight:   Vital Signs: Temp: 99 F (37.2 C) (10/24 0514) Temp src: Oral (10/24 0514) BP: 118/71 mmHg (10/24 0514) Pulse Rate: 98 (10/24 0514) Intake/Output from previous day: 10/23 0701 - 10/24 0700 In: 720 [P.O.:720] Out: 1350 [Urine:1350] Intake/Output from this shift: Total I/O In: 120 [P.O.:120] Out: 500 [Urine:500]  Labs:  Recent Labs  02/04/13 0420 02/05/13 0500  WBC 14.7* 13.4*  HGB 9.0* 8.8*  PLT 372 406*  CREATININE 0.76 0.73   Estimated Creatinine Clearance: 137.8 ml/min (by C-G formula based on Cr of 0.73). No results found for this basename: VANCOTROUGH, Leodis Binet, VANCORANDOM, GENTTROUGH, GENTPEAK, GENTRANDOM, TOBRATROUGH, TOBRAPEAK, TOBRARND, AMIKACINPEAK, AMIKACINTROU, AMIKACIN,  in the last 72 hours   Microbiology: Recent Results (from the past 720 hour(s))  MRSA PCR SCREENING     Status: None   Collection Time    01/21/13  4:57 PM      Result Value Range Status   MRSA by PCR NEGATIVE  NEGATIVE Final   Comment:            The GeneXpert MRSA Assay (FDA     approved for NASAL specimens     only), is one component of a     comprehensive MRSA colonization     surveillance program. It is not     intended to diagnose MRSA     infection nor to guide or     monitor treatment for     MRSA infections.  BODY FLUID CULTURE     Status: None   Collection Time    01/24/13 10:20 AM      Result Value Range Status   Specimen Description PERICARDIAL FLUID   Final   Special Requests NONE   Final   Gram Stain     Final   Value: ABUNDANT WBC PRESENT, PREDOMINANTLY PMN     NO ORGANISMS SEEN     Performed at [facility]   Culture     Final   Value: NO GROWTH 3 DAYS     Performed at [facility]    Report Status 01/28/2013 FINAL   Final  URINE CULTURE     Status: None   Collection Time    01/28/13  8:48 AM      Result Value Range Status   Specimen Description URINE, CATHETERIZED   Final   Special Requests none Normal   Final   Culture  Setup Time     Final   Value: 01/28/2013 16:01     Performed at [facility] Count     Final   Value: NO GROWTH     Performed at [facility]   Culture     Final   Value: NO GROWTH     Performed at [facility]   Report Status 01/29/2013 FINAL   Final  CULTURE, RESPIRATORY (NON-EXPECTORATED)     Status: None   Collection Time    01/28/13 10:24 AM      Result Value Range Status   Specimen Description TRACHEAL ASPIRATE   Final   Special Requests NONE   Final   Gram Stain     Final   Value: FEW WBC PRESENT,BOTH PMN AND MONONUCLEAR     NO SQUAMOUS EPITHELIAL  CELLS SEEN     RARE GRAM POSITIVE COCCI     IN PAIRS     Performed at [facility]   Culture     Final   Value: Non-Pathogenic Oropharyngeal-type Flora Isolated.     Performed at [facility]   Report Status 01/30/2013 FINAL   Final  CULTURE, BLOOD (ROUTINE X 2)     Status: None   Collection Time    01/28/13 11:30 AM      Result Value Range Status   Specimen Description BLOOD LEFT ARM   Final   Special Requests BOTTLES DRAWN AEROBIC AND ANAEROBIC 10CC   Final   Culture  Setup Time     Final   Value: 01/28/2013 14:06     Performed at [facility]   Culture     Final   Value: NO GROWTH 5 DAYS     Performed at [facility]   Report Status 02/03/2013 FINAL   Final  CULTURE, BLOOD (ROUTINE X 2)     Status: None   Collection Time    01/28/13 11:30 AM      Result Value Range Status   Specimen Description BLOOD RIGHT FOREARM   Final   Special Requests BOTTLES DRAWN AEROBIC AND ANAEROBIC 10CC   Final   Culture  Setup Time     Final   Value: 01/28/2013 14:06     Performed at [facility]   Culture     Final    Value: NO GROWTH 5 DAYS     Performed at [facility]   Report Status 02/03/2013 FINAL   Final  SURGICAL PCR SCREEN     Status: Abnormal   Collection Time    01/28/13  1:19 PM      Result Value Range Status   MRSA, PCR NEGATIVE  NEGATIVE Final   Staphylococcus aureus POSITIVE (*) NEGATIVE Final   Comment:            The Xpert SA Assay (FDA     approved for NASAL specimens     in patients over 20 years of age),     is one component of     a comprehensive surveillance     program.  Test performance has     been validated by [facility] for patients greater     than or equal to 76 year old.     It is not intended     to diagnose infection nor to     guide or monitor treatment.  AFB CULTURE WITH SMEAR     Status: None   Collection Time    01/28/13  3:36 PM      Result Value Range Status   Specimen Description WOUND PERICARDIAL   Final   Special Requests PATIENT ON FOLLOWING ZINACEF PERICARDIAL CLOT   Final   ACID FAST SMEAR     Final   Value: NO ACID FAST BACILLI SEEN     Performed at [facility]   Culture     Final   Value: CULTURE WILL BE EXAMINED FOR 6 WEEKS BEFORE ISSUING A FINAL REPORT     Performed at [facility]   Report Status PENDING   Incomplete  WOUND CULTURE     Status: None   Collection Time    01/28/13  3:36 PM      Result Value Range Status   Specimen Description WOUND PERICARDIAL  Final   Special Requests PATIENT ON FOLLOWING ZINACEF PERICARDIAL CLOT   Final   Gram Stain     Final   Value: RARE WBC PRESENT,BOTH PMN AND MONONUCLEAR     NO SQUAMOUS EPITHELIAL CELLS SEEN     NO ORGANISMS SEEN     Performed at [facility]   Culture     Final   Value: NO GROWTH 2 DAYS     Performed at [facility]   Report Status 01/30/2013 FINAL   Final  FUNGUS CULTURE W SMEAR     Status: None   Collection Time    01/28/13  3:36 PM      Result Value Range Status   Specimen Description WOUND PERICARDIAL   Final   Special  Requests PATIENT ON FOLLOWING ZINACEF PERICARDIAL CLOT   Final   Fungal Smear     Final   Value: NO YEAST OR FUNGAL ELEMENTS SEEN     Performed at [facility]   Culture     Final   Value: CULTURE IN PROGRESS FOR FOUR WEEKS     Performed at [facility]   Report Status PENDING   Incomplete  AFB CULTURE WITH SMEAR     Status: None   Collection Time    01/28/13  3:36 PM      Result Value Range Status   Specimen Description WOUND PERICARDIAL   Final   Special Requests PATIENT ON FOLLOWING ZINACEF PERICARDIAL CLOT   Final   ACID FAST SMEAR     Final   Value: NO ACID FAST BACILLI SEEN     Performed at [facility]   Culture     Final   Value: CULTURE WILL BE EXAMINED FOR 6 WEEKS BEFORE ISSUING A FINAL REPORT     Performed at [facility]   Report Status PENDING   Incomplete  WOUND CULTURE     Status: None   Collection Time    01/28/13  3:36 PM      Result Value Range Status   Specimen Description WOUND PERICARDIAL   Final   Special Requests PATIENT ON FOLLOWING ZINACEF PERICARDIAL CLOT   Final   Gram Stain     Final   Value: MODERATE WBC PRESENT,BOTH PMN AND MONONUCLEAR     NO SQUAMOUS EPITHELIAL CELLS SEEN     NO ORGANISMS SEEN     Performed at [facility]   Culture     Final   Value: NO GROWTH 2 DAYS     Performed at [facility]   Report Status 01/30/2013 FINAL   Final  FUNGUS CULTURE W SMEAR     Status: None   Collection Time    01/28/13  3:36 PM      Result Value Range Status   Specimen Description WOUND PERICARDIAL   Final   Special Requests PATIENT ON FOLLOWING ZINACEF PERICARDIAL CLOT   Final   Fungal Smear     Final   Value: NO YEAST OR FUNGAL ELEMENTS SEEN     Performed at [facility]   Culture     Final   Value: CULTURE IN PROGRESS FOR FOUR WEEKS     Performed at [facility]   Report Status PENDING   Incomplete  ANAEROBIC CULTURE     Status: None   Collection Time    01/28/13  3:36 PM       Result Value Range Status   Specimen Description  WOUND PERICARDIAL   Final   Special Requests PATIENT ON FOLLOWING ZINACEF PERICARDIAL CLOT   Final   Gram Stain     Final   Value: MODERATE WBC PRESENT,BOTH PMN AND MONONUCLEAR     NO SQUAMOUS EPITHELIAL CELLS SEEN     NO ORGANISMS SEEN     Performed at [facility]   Culture     Final   Value: NO ANAEROBES ISOLATED     Performed at [facility]   Report Status 02/02/2013 FINAL   Final  ANAEROBIC CULTURE     Status: None   Collection Time    01/28/13  3:36 PM      Result Value Range Status   Specimen Description WOUND PERICARDIAL   Final   Special Requests PATIENT ON FOLLOWING ZINACEF PERICARDIAL CLOT   Final   Gram Stain     Final   Value: RARE WBC PRESENT,BOTH PMN AND MONONUCLEAR     NO SQUAMOUS EPITHELIAL CELLS SEEN     NO ORGANISMS SEEN     Performed at [facility]   Culture     Final   Value: NO ANAEROBES ISOLATED     Performed at [facility]   Report Status 02/02/2013 FINAL   Final  AFB CULTURE WITH SMEAR     Status: None   Collection Time    01/28/13  3:42 PM      Result Value Range Status   Specimen Description TISSUE PERICARDIAL   Final   Special Requests NO 2 PT ON ZINACEF   Final   ACID FAST SMEAR     Final   Value: NO ACID FAST BACILLI SEEN     Performed at [facility]   Culture     Final   Value: CULTURE WILL BE EXAMINED FOR 6 WEEKS BEFORE ISSUING A FINAL REPORT     Performed at [facility]   Report Status PENDING   Incomplete  ANAEROBIC CULTURE     Status: None   Collection Time    01/28/13  3:42 PM      Result Value Range Status   Specimen Description TISSUE PERICARDIAL   Final   Special Requests NO 2 PT ON ZINACEF   Final   Gram Stain     Final   Value: FEW WBC PRESENT,BOTH PMN AND MONONUCLEAR     NO ORGANISMS SEEN     Performed at [facility]   Culture     Final   Value: NO ANAEROBES ISOLATED     Performed at [facility]   Report  Status 02/02/2013 FINAL   Final  FUNGUS CULTURE W SMEAR     Status: None   Collection Time    01/28/13  3:42 PM      Result Value Range Status   Specimen Description TISSUE PERICARDIAL   Final   Special Requests NO 2 PT ON ZINACEF   Final   Fungal Smear     Final   Value: NO YEAST OR FUNGAL ELEMENTS SEEN     Performed at [facility]   Culture     Final   Value: CULTURE IN PROGRESS FOR FOUR WEEKS     Performed at [facility]   Report Status PENDING   Incomplete  TISSUE CULTURE     Status: None   Collection Time    01/28/13  3:42 PM      Result Value Range Status   Specimen  Description TISSUE PERICARDIAL   Final   Special Requests NO 2 PT ON ZINACEF   Final   Gram Stain     Final   Value: FEW WBC PRESENT,BOTH PMN AND MONONUCLEAR     NO ORGANISMS SEEN     Performed at [facility]   Culture     Final   Value: NO GROWTH 3 DAYS     Performed at [facility]   Report Status 01/31/2013 FINAL   Final  AFB CULTURE WITH SMEAR     Status: None   Collection Time    01/28/13  3:48 PM      Result Value Range Status   Specimen Description TISSUE   Final   Special Requests NO 3 EPICARDIUM PT ON ZINACEF   Final   ACID FAST SMEAR     Final   Value: NO ACID FAST BACILLI SEEN     Performed at [facility]   Culture     Final   Value: CULTURE WILL BE EXAMINED FOR 6 WEEKS BEFORE ISSUING A FINAL REPORT     Performed at [facility]   Report Status PENDING   Incomplete  ANAEROBIC CULTURE     Status: None   Collection Time    01/28/13  3:48 PM      Result Value Range Status   Specimen Description TISSUE   Final   Special Requests NO 3 EPICARDIUM PT ON ZINACEF   Final   Gram Stain     Final   Value: FEW WBC PRESENT, PREDOMINANTLY PMN     NO ORGANISMS SEEN     Performed at [facility]   Culture     Final   Value: NO ANAEROBES ISOLATED     Performed at [facility]   Report Status 02/02/2013 FINAL   Final  FUNGUS CULTURE W  SMEAR     Status: None   Collection Time    01/28/13  3:48 PM      Result Value Range Status   Specimen Description TISSUE   Final   Special Requests NO 3 EPICARDIUM PT ON ZINACEF   Final   Fungal Smear     Final   Value: NO YEAST OR FUNGAL ELEMENTS SEEN     Performed at [facility]   Culture     Final   Value: CULTURE IN PROGRESS FOR FOUR WEEKS     Performed at [facility]   Report Status PENDING   Incomplete  TISSUE CULTURE     Status: None   Collection Time    01/28/13  3:48 PM      Result Value Range Status   Specimen Description TISSUE   Final   Special Requests NO 3 EPICARDIUM PT ON ZINACEF   Final   Gram Stain     Final   Value: FEW WBC PRESENT, PREDOMINANTLY PMN     NO ORGANISMS SEEN     Performed at [facility]   Culture     Final   Value: NO GROWTH 3 DAYS     Performed at [facility]   Report Status 01/31/2013 FINAL   Final  WOUND CULTURE     Status: None   Collection Time    01/28/13  4:05 PM      Result Value Range Status   Specimen Description WOUND   Final   Special Requests PATIENT ON FOLLOWING ZINACEF LEFT VENTRICULAR CLOT   Final  Gram Stain     Final   Value: MODERATE WBC PRESENT, PREDOMINANTLY PMN     NO SQUAMOUS EPITHELIAL CELLS SEEN     NO ORGANISMS SEEN     Performed at [facility]   Culture     Final   Value: NO GROWTH 2 DAYS     Performed at [facility]   Report Status 01/30/2013 FINAL   Final  AFB CULTURE WITH SMEAR     Status: None   Collection Time    01/28/13  4:05 PM      Result Value Range Status   Specimen Description WOUND   Final   Special Requests PATIENT ON FOLLOWING ZINACEF LEFT VENTRICULAR CLOT   Final   ACID FAST SMEAR     Final   Value: NO ACID FAST BACILLI SEEN     Performed at [facility]   Culture     Final   Value: CULTURE WILL BE EXAMINED FOR 6 WEEKS BEFORE ISSUING A FINAL REPORT     Performed at [facility]   Report Status PENDING   Incomplete   FUNGUS CULTURE W SMEAR     Status: None   Collection Time    01/28/13  4:05 PM      Result Value Range Status   Specimen Description WOUND   Final   Special Requests     Final   Value: PATIENT ON FOLLOWING ZINACEF LEFRT VENTRICULAR CLOT   Fungal Smear     Final   Value: NO YEAST OR FUNGAL ELEMENTS SEEN     Performed at [facility]   Culture     Final   Value: CULTURE IN PROGRESS FOR FOUR WEEKS     Performed at [facility]   Report Status PENDING   Incomplete  CLOSTRIDIUM DIFFICILE BY PCR     Status: None   Collection Time    02/03/13  6:05 AM      Result Value Range Status   C difficile by pcr NEGATIVE  NEGATIVE Final    Anti-infectives   Start     Dose/Rate Route Frequency Ordered Stop   02/04/13 2100  imipenem-cilastatin (PRIMAXIN) 500 mg in sodium chloride 0.9 % 100 mL IVPB     500 mg 200 mL/hr over 30 Minutes Intravenous Every 6 hours 02/04/13 1730 02/12/13 2059      Assessment: 52yo male with acute pericarditis, on Primaxin to complete total of 14 days of antibiotics.  WBC has been trending down over the last week and Cr <1.  Goal of Therapy:  resolution of infection  Plan:  Continue Primaxin 500mg  IV q6, to complete total 14 days antibiotics  Marisue Humble, PharmD Clinical Pharmacist [facility]

## 2013-02-07 ENCOUNTER — Inpatient Hospital Stay (HOSPITAL_COMMUNITY): Payer: Medicaid Other

## 2013-02-07 ENCOUNTER — Inpatient Hospital Stay (HOSPITAL_COMMUNITY): Payer: Self-pay | Admitting: Speech Pathology

## 2013-02-07 ENCOUNTER — Inpatient Hospital Stay (HOSPITAL_COMMUNITY): Payer: Medicaid Other | Admitting: Physical Therapy

## 2013-02-07 NOTE — Progress Notes (Signed)
Speech Language Pathology Daily Session Note  Patient Details  Name: [name] MRN: [id number] Date of Birth: May 23, 1960  Today's Date: 02/07/2013 Time: 7829-5621 Time Calculation (min): 30 min  Short Term Goals: Week 1: SLP Short Term Goal 1 (Week 1): Patient will solve functional complex problems with Min verbal cues. SLP Short Term Goal 1 - Progress (Week 1): Progressing toward goal SLP Short Term Goal 2 (Week 1): Patient will utilize external memory aids with Supervision cues. SLP Short Term Goal 2 - Progress (Week 1): Progressing toward goal SLP Short Term Goal 3 (Week 1): Patient will demonstrate alternating attention during a structured task for 10 minutes with Min cues. SLP Short Term Goal 3 - Progress (Week 1): Progressing toward goal SLP Short Term Goal 4 (Week 1): Patient will self-monitor himself during a structured task with Min cues.  Skilled Therapeutic Interventions: Treatment focused on using strategies to enhance recall of verbal information.  After listening to a newspaper article read aloud, pt recalled two accurate facts, and mistakenly identified four other items.  Reviewed association/visual strategies to improve recall.  In subsequent reading task, pt recalled five items correctly with min cues.  Pt identified weakness in left arm as only deficit post-stroke; with min verbal prompting, able to identify additional  deficits.  Continue plan of care.    FIM:  Comprehension Comprehension Mode: Auditory Comprehension: 5-Follows basic conversation/direction: With no assist Expression Expression Mode: Verbal Expression: 5-Expresses basic needs/ideas: With no assist Social Interaction Social Interaction: 5-Interacts appropriately 90% of the time - Needs monitoring or encouragement for participation or interaction. Problem Solving Problem Solving: 4-Solves basic 75 - 89% of the time/requires cueing 10 - 24% of the time Memory Memory: 4-Recognizes or recalls 75 -  89% of the time/requires cueing 10 - 24% of the time  Pain Pain Assessment Pain Assessment: No/denies pain  Therapy/Group: Individual Therapy  Blenda Mounts Laurice 02/07/2013, 9:19 AM

## 2013-02-07 NOTE — Progress Notes (Signed)
Subjective/Complaints: 52 y.o. male with history of CAD and recent NSTEMI 01/05/13 treated medically. He was readmitted on 01/31/13 with recurrent chest pain due to acute pericarditis and cardiac tamponade (Dressler syndrome). He underwent pericardiocentesis 10/11 with improvement in symptoms and drain removed 10/14. On 10/15, he developed LUE sensory loss with left hand weakness and ataxia,left visual deficits. He was noted to be hypotensive, hypoxic with fever and chills. He was intubated and started on broad spectrum antibiotics for sepsis and CT head negative. Repeat echo showed a complex effusion with RV collapse and tamponade. He was taken to OR the same day for CABG X1 with repair of LV ruptured inferoposterior pseudoaneurysm by Dr. Dorris Fetch.  Dr. Luciana Axe and Dr. Pearlean Brownie consulted for input. CTA head/neck with New acute/ subacute nonhemorrhagic infarcts involving the left superior cerebellum, the posterior and medial left parietal lobe and lateral left occipital lobe, the higher left frontal lobe, the posterior right frontal lobe and corona radiata. He was extubated without difficulty. Neurology feels that patient with embolic stroke on unknown source but suspected to be due to sepsis. Antibiotics narrowed to imipenem with recommendations for 14 total days antibiotics--stop date 10/29  Moving left arm better Review of Systems - Negative except Left arm weakness  Objective: Vital Signs: Blood pressure 96/67, pulse 101, temperature 99.2 F (37.3 C), temperature source Oral, resp. rate 17, height 6\' 1"  (1.854 m), weight 99.9 kg (220 lb 3.8 oz), SpO2 97.00%. No results found. Results for orders placed during the hospital encounter of 02/04/13 (from the past 72 hour(s))  CBC WITH DIFFERENTIAL     Status: Abnormal   Collection Time    02/05/13  5:00 AM      Result Value Range   WBC 13.4 (*) 4.0 - 10.5 K/uL   RBC 3.03 (*) 4.22 - 5.81 MIL/uL   Hemoglobin 8.8 (*) 13.0 - 17.0 g/dL   HCT 19.1 (*) 47.8  - 52.0 %   MCV 90.1  78.0 - 100.0 fL   MCH 29.0  26.0 - 34.0 pg   MCHC 32.2  30.0 - 36.0 g/dL   RDW 29.5 (*) 62.1 - 30.8 %   Platelets 406 (*) 150 - 400 K/uL   Neutrophils Relative % 73  43 - 77 %   Lymphocytes Relative 19  12 - 46 %   Monocytes Relative 8  3 - 12 %   Eosinophils Relative 0  0 - 5 %   Basophils Relative 0  0 - 1 %   Neutro Abs 9.8 (*) 1.7 - 7.7 K/uL   Lymphs Abs 2.5  0.7 - 4.0 K/uL   Monocytes Absolute 1.1 (*) 0.1 - 1.0 K/uL   Eosinophils Absolute 0.0  0.0 - 0.7 K/uL   Basophils Absolute 0.0  0.0 - 0.1 K/uL   RBC Morphology POLYCHROMASIA PRESENT    COMPREHENSIVE METABOLIC PANEL     Status: Abnormal   Collection Time    02/05/13  5:00 AM      Result Value Range   Sodium 132 (*) 135 - 145 mEq/L   Potassium 3.6  3.5 - 5.1 mEq/L   Chloride 97  96 - 112 mEq/L   CO2 24  19 - 32 mEq/L   Glucose, Bld 99  70 - 99 mg/dL   BUN 10  6 - 23 mg/dL   Creatinine, Ser 6.57  0.50 - 1.35 mg/dL   Calcium 8.3 (*) 8.4 - 10.5 mg/dL   Total Protein 6.3  6.0 - 8.3 g/dL  Albumin 2.3 (*) 3.5 - 5.2 g/dL   AST 42 (*) 0 - 37 U/L   ALT 31  0 - 53 U/L   Alkaline Phosphatase 134 (*) 39 - 117 U/L   Total Bilirubin 0.6  0.3 - 1.2 mg/dL   GFR calc non Af Amer >90  >90 mL/min   GFR calc Af Amer >90  >90 mL/min   Comment: (NOTE)     The eGFR has been calculated using the CKD EPI equation.     This calculation has not been validated in all clinical situations.     eGFR's persistently <90 mL/min signify possible Chronic Kidney     Disease.     HEENT: normal Cardio: RRR and No murmurs or extra sounds Resp: CTA B/L and Unlabored GI: BS positive and Nontender nondistended Extremity:  Pulses positive and Edema Left dorsal hand Skin:   Wound C/D/I and Sternotomy wound is well healed. Drain sites still in completely closed, small amount of dried blood Neuro: Alert/Oriented, Flat, Cranial Nerve II-XII normal, Abnormal Sensory Paresthesias left arm, Normal Motor and Tone:  Hypotonia Musc/Skel:   Other No pain with left shoulder range of motion. General no acute distress Motor strength is 2-/5 in the left deltoid, bicep, tricep,0/5 grip, 4/5 in the left hip flexor, knee extensors, ankle dorsi flexion plantar flexor 5/5 in the right upper and right lower limb  Assessment/Plan: 1. Functional deficits secondary to right posterior frontal and corona radiata infarcts causing left upper extremity weakness, also deconditioned from coronary artery disease status post CABG which require 3+ hours per day of interdisciplinary therapy in a comprehensive inpatient rehab setting. Physiatrist is providing close team supervision and 24 hour management of active medical problems listed below. Physiatrist and rehab team continue to assess barriers to discharge/monitor patient progress toward functional and medical goals. Improving neurologic recovery left upper extremity FIM: FIM - Bathing Bathing Steps Patient Completed: Chest;Left Arm;Abdomen;Right upper leg;Left upper leg Bathing: 3: Mod-Patient completes 5-7 51f 10 parts or 50-74%  FIM - Upper Body Dressing/Undressing Upper body dressing/undressing steps patient completed: Pull shirt over trunk Upper body dressing/undressing: 2: Max-Patient completed 25-49% of tasks FIM - Lower Body Dressing/Undressing Lower body dressing/undressing: 1: Total-Patient completed less than 25% of tasks  FIM - Toileting Toileting steps completed by patient: Performs perineal hygiene Toileting: 2: Max-Patient completed 1 of 3 steps  FIM - Diplomatic Services operational officer Devices: Grab bars Toilet Transfers: 3-To toilet/BSC: Mod A (lift or lower assist);3-From toilet/BSC: Mod A (lift or lower assist)  FIM - Press photographer Assistive Devices: HOB elevated Bed/Chair Transfer: 0: Activity did not occur  FIM - Locomotion: Wheelchair Distance: 50 Locomotion: Wheelchair: 0: Activity did not occur FIM - Locomotion:  Ambulation Locomotion: Ambulation Assistive Devices: Chief Operating Officer Ambulation/Gait Assistance: 4: Min assist Locomotion: Ambulation: 0: Activity did not occur  Comprehension Comprehension Mode: Auditory Comprehension: 5-Follows basic conversation/direction: With no assist  Expression Expression Mode: Verbal Expression: 5-Expresses basic needs/ideas: With no assist  Social Interaction Social Interaction: 5-Interacts appropriately 90% of the time - Needs monitoring or encouragement for participation or interaction.  Problem Solving Problem Solving Mode: Asleep Problem Solving: 4-Solves basic 75 - 89% of the time/requires cueing 10 - 24% of the time  Memory Memory Mode: Asleep Memory: 4-Recognizes or recalls 75 - 89% of the time/requires cueing 10 - 24% of the time  Medical Problem List and Plan:  1. DVT Prophylaxis/Anticoagulation: Pharmaceutical: Lovenox  2. Pain Management: prn medications effective.  3.  Mood: Provide ego support--has supportive girlfriend. Will have LCSW follow for evaluation.  4. Neuropsych: This patient is capable of making decisions on his own behalf.  5. Pericarditis: Continue imipenem through 02/11/13. Leucocytosis resolving.  6. Antibiotic associated diarrhea: C-diff check 10/21 was negative. Will add probiotics and fiber to help with symptoms.  7. ABLA: Add iron supplement  8. CAD/s/p CABG X 1: sternal precautions.Appreciate CVTS note  Continue ASA/plavix, lisinopril, zocor and metoprolol    LOS (Days) 3 A FACE TO FACE EVALUATION WAS PERFORMED  KIRSTEINS,ANDREW E 02/07/2013, 10:01 AM

## 2013-02-07 NOTE — IPOC Note (Signed)
Overall Plan of Care [facility]) Patient Details Name: [name] MRN: [id number] DOB: [birth year]  Admitting Diagnosis: CVA,Pericarditis  Hospital Problems: Principal Problem:   CVA (cerebral infarction) Active Problems:   Acute pericarditis, unspecified   Leucocytosis   Acute blood loss anemia     Functional Problem List: Nursing Edema;Skin Integrity  PT Balance;Endurance;Motor;Pain;Safety  OT Balance;Cognition;Endurance;Motor;Pain;Perception;Safety;Sensory;Vision  SLP Cognition  TR         Basic ADL's: OT Eating;Grooming;Dressing;Toileting;Bathing     Advanced  ADL's: OT Simple Meal Preparation     Transfers: PT Bed Mobility;Bed to Chair;Car;Furniture  OT Tub/Shower     Locomotion: PT Ambulation;Wheelchair Mobility;Stairs     Additional Impairments: OT Fuctional Use of Upper Extremity  SLP Social Cognition   Attention;Memory;Problem Solving  TR      Anticipated Outcomes Item Anticipated Outcome  Self Feeding supervision  Swallowing  N/A   Basic self-care  supervision  Toileting  supervision   Bathroom Transfers supervision  Bowel/Bladder  Remain continent of bowel and bladder  Transfers  supervision level for all transfers   Locomotion  supervision level for gait, stairs, and wheelchair mobility.   Communication  N/A  Cognition  Mod I to Supervision  Pain  < 3  Safety/Judgment  Patient will adhere to safety plan with min assist   Therapy Plan: PT Intensity: Minimum of 1-2 x/day ,45 to 90 minutes PT Frequency: 5 out of 7 days PT Duration Estimated Length of Stay: 2-3 weeks  OT Intensity: Minimum of 1-2 x/day, 45 to 90 minutes OT Frequency: 5 out of 7 days OT Duration/Estimated Length of Stay: 2.5-3 weeks  SLP Intensity: Minumum of 1-2 x/day, 30 to 90 minutes SLP Frequency: 5 out of 7 days SLP Duration/Estimated Length of Stay: 2 to 3 weeks       Team Interventions: Nursing Interventions Skin Care/Wound Management  PT interventions  Ambulation/gait training;Balance/vestibular training;Community reintegration;Discharge planning;Disease management/prevention;DME/adaptive equipment instruction;Functional mobility training;Neuromuscular re-education;Pain management;Patient/family education;Psychosocial support;Splinting/orthotics;Stair training;Therapeutic Activities;Therapeutic Exercise;UE/LE Strength taining/ROM;UE/LE Coordination activities;Wheelchair propulsion/positioning  OT Interventions Balance/vestibular training;Cognitive remediation/compensation;Community reintegration;Discharge planning;DME/adaptive equipment instruction;Functional mobility training;Functional electrical stimulation;Neuromuscular re-education;Pain management;Psychosocial support;Patient/family education;Self Care/advanced ADL retraining;Therapeutic Activities;Therapeutic Exercise;UE/LE Strength taining/ROM;UE/LE Coordination activities;Visual/perceptual remediation/compensation  SLP Interventions Cognitive remediation/compensation;Cueing hierarchy;Functional tasks;Internal/external aids;Patient/family education  TR Interventions    SW/CM Interventions Discharge Planning;Psychosocial Support;Patient/Family Education    Team Discharge Planning: Destination: PT-Home ,OT- Home , SLP-Home Projected Follow-up: PT-Outpatient PT, OT-  Home health OT, SLP-Outpatient SLP Projected Equipment Needs: PT- , OT- 3 in 1 bedside comode;Tub/shower seat, SLP-None recommended by SLP Patient/family involved in discharge planning: PT- Patient,  OT-Patient, SLP-Patient;Family member/caregiver  MD ELOS: 2wk Medical Rehab Prognosis:  Good Assessment: 52 y.o. male with history of CAD and recent NSTEMI 01/05/13 treated medically. He was readmitted on 01/31/13 with recurrent chest pain due to acute pericarditis and cardiac tamponade (Dressler syndrome). He underwent pericardiocentesis 10/11 with improvement in symptoms and drain removed 10/14. On 10/15, he developed LUE sensory loss  with left hand weakness and ataxia,left visual deficits. He was noted to be hypotensive, hypoxic with fever and chills. He was intubated and started on broad spectrum antibiotics for sepsis and CT head negative. Repeat echo showed a complex effusion with RV collapse and tamponade. He was taken to OR the same day for CABG X1 with repair of LV ruptured inferoposterior pseudoaneurysm by Dr. Dorris Fetch.  Dr. Luciana Axe and Dr. Pearlean Brownie consulted for input. CTA head/neck with New acute/ subacute nonhemorrhagic infarcts involving the left superior cerebellum, the posterior and medial left parietal lobe and lateral  left occipital lobe, the higher left frontal lobe, the posterior right frontal lobe and corona radiata. He was extubated without difficulty. Neurology feels that patient with embolic stroke     Now requiring 24/7 Rehab RN,MD, as well as CIR level PT, OT and SLP.  Treatment team will focus on ADLs and mobility with goals set at sup   See Team Conference Notes for weekly updates to the plan of care

## 2013-02-07 NOTE — Progress Notes (Signed)
Occupational Therapy Session Note  Patient Details  Name: [name] MRN: [id number] Date of Birth: 1961-03-05  Today's Date: 02/07/2013 Time: 0930-1030 and 1345-1410 Time Calculation (min): 60 min and 25 min  Short Term Goals: Week 1:  OT Short Term Goal 1 (Week 1): Pt will complete toilet transfer with min assist OT Short Term Goal 2 (Week 1): Pt will complete shower transfer with min assi OT Short Term Goal 3 (Week 1): Pt will complete bathing with mod assist  OT Short Term Goal 4 (Week 1): Pt will complete LB dressing with mod assist  Skilled Therapeutic Interventions/Progress Updates:    Session 1: Therapy session focused on activity tolerance, functional transfers, standing balance, and AE during ADL retraining. Pt received supine in bed and completed bathing at sink. Pt requesting to complete toilet task during bathing. Completed stand pivot transfer with min assist w/c<>toilet. Required cues to follow sternal precautions during transfers. Pt with min-supervision for standing balance as he completed hygiene. Required frequent rest breaks during bathing and dressing secondary to fatigue. Used LH sponge for LB bathing and introduced reacher for dressing. Pt required assist to thread first LE then able to use reacher to assist with threading other LE. At end of session pt fatigued and requesting to return to bed. Required cues to follow sternal precautions during transfers and bed mobility. Pt left supine in bed with HOB raised and LUE propped on pillow. All items in reach.  Session 2: Therapy session focused on functional transfers, standing balance, and activity tolerance. Pt received sitting in w/c reporting being "very very tired" however willing to "try" therapy. Introduced sock-aid to pt and required assist to don sock onto aid. Pt able to use RUE to pull strings to don sock part of way then required assist to completely don sock. Pt interested in sock-aid and wants to continue  practicing. Discussed going to gym for remainder of therapy session and pt reported being extremely tired and wanting to return to bed. Encouraged completing therapy session in room however pt asking to "rest" and stating "I don't think I can do anymore." Pt requesting to change out of clothing and required total assist for LB clothing and completed sit<>stand x2 with min assist and min-supervision for standing balance. Ambulated approx 5 feet towards bed with steadying assist then completed sit>supine with min assist. Pt left in bed with all items in reach and fiancee present.   Therapy Documentation Precautions:  Precautions Precautions: Sternal;Fall Precaution Comments: decreased recall of sternal precautions Restrictions Weight Bearing Restrictions: No General:   Vital Signs:   Pain: No c/o pain during therapy sessions.   See FIM for current functional status  Therapy/Group: Individual Therapy  Daneil Dan 02/07/2013, 12:32 PM

## 2013-02-07 NOTE — Progress Notes (Signed)
Physical Therapy Note  Patient Details  Name: [name] MRN: [id number] Date of Birth: 1960-08-13 Today's Date: 02/07/2013  1105-1200 (55 minutes) individual  Pain: no reported pain Precautions: STERNAL (pt recalls 3/4) Other: Oxygen sats > 92% RA ; pulse 106 (resting) Focus of treatment: Neuro re-ed left LE; gait training; Therapeutic exercise focused on activity tolerance Treatment: Pt in bed upon arrival ; Neuro re-ed left LE- hip flexion/knee flexion to extension with minimal manual resistance; hip abduction (all X 20); supine to sit min assist from left; sit to stand with hands on knees close SBA; transfers stand/turn SBA; wc mobility 120 feet using bilateral LEs SBA with increased time; gait 70 feet LBQC close SBA for safety; Nustep Level 4 x 10 minutes ( pulse 107 @ 3 minutes) ; sit to stand X 5 from mat with hands on knees for quad strengthening.   Luian Schumpert,JIM 02/07/2013, 11:21 AM

## 2013-02-08 ENCOUNTER — Inpatient Hospital Stay (HOSPITAL_COMMUNITY): Payer: Medicaid Other | Admitting: *Deleted

## 2013-02-08 ENCOUNTER — Inpatient Hospital Stay (HOSPITAL_COMMUNITY): Payer: Medicaid Other | Admitting: Physical Therapy

## 2013-02-08 NOTE — Progress Notes (Signed)
Occupational Therapy Note  Patient Details  Name: [name] MRN: [id number] Date of Birth: [birth year] Today's Date: 02/08/2013  Time: 1630-1720  (60 min) Pain:none Individual session.   Transfers from supine to sit with supervision to left; transfers bed to wc with minimal assist SPT; Engaged in standing balance to use urinal, pants doffing and donning with max assist.   Took pt to gym and stood at parallel bars x5 for balance activities .  Did 30 degrees upper trunk rotation to place rings over half circle, clothespins on box.  Stood for 1 - 1.5 minutes each time.  Last one he rotated left to right for ring placement.   Had no LOB.  Transferred back to bed. Sat on EOB and doffed shorts in standing with minimal assist; ; mod assist doffing shirt; sit to supine with minimal assist.    Left pt in bed with bed alarmt on and call bell,phone within reach.      Humberto Seals 02/08/2013, 5:08 PM

## 2013-02-08 NOTE — Progress Notes (Signed)
Subjective/Complaints: 52 y.o. male with history of CAD and recent NSTEMI 01/05/13 treated medically. He was readmitted on 01/31/13 with recurrent chest pain due to acute pericarditis and cardiac tamponade (Dressler syndrome). He underwent pericardiocentesis 10/11 with improvement in symptoms and drain removed 10/14. On 10/15, he developed LUE sensory loss with left hand weakness and ataxia,left visual deficits. He was noted to be hypotensive, hypoxic with fever and chills. He was intubated and started on broad spectrum antibiotics for sepsis and CT head negative. Repeat echo showed a complex effusion with RV collapse and tamponade. He was taken to OR the same day for CABG X1 with repair of LV ruptured inferoposterior pseudoaneurysm by Dr. Dorris Fetch.  Dr. Luciana Axe and Dr. Pearlean Brownie consulted for input. CTA head/neck with New acute/ subacute nonhemorrhagic infarcts involving the left superior cerebellum, the posterior and medial left parietal lobe and lateral left occipital lobe, the higher left frontal lobe, the posterior right frontal lobe and corona radiata. He was extubated without difficulty. Neurology feels that patient with embolic stroke on unknown source but suspected to be due to sepsis. Antibiotics narrowed to imipenem with recommendations for 14 total days antibiotics--stop date 10/29  Moving left arm better, patient without new complaints Review of Systems - Negative except Left arm weakness  Objective: Vital Signs: Blood pressure 113/69, pulse 113, temperature 98.9 F (37.2 C), temperature source Oral, resp. rate 16, height 6\' 1"  (1.854 m), weight 101.2 kg (223 lb 1.7 oz), SpO2 100.00%. No results found. No results found for this or any previous visit (from the past 72 hour(s)).   HEENT: normal Cardio: RRR and No murmurs or extra sounds Resp: CTA B/L and Unlabored GI: BS positive and Nontender nondistended Extremity:  Pulses positive and Edema Left dorsal hand Skin:   Wound C/D/I and  Sternotomy wound is well healed. Drain sites still in completely closed, small amount of dried blood Neuro: Alert/Oriented, Flat, Cranial Nerve II-XII normal, Abnormal Sensory Paresthesias left arm, Normal Motor and Tone:  Hypotonia Musc/Skel:  Other No pain with left shoulder range of motion. General no acute distress Motor strength is 2-/5 in the left deltoid, bicep, tricep,0/5 grip, 4/5 in the left hip flexor, knee extensors, ankle dorsi flexion plantar flexor 5/5 in the right upper and right lower limb  Assessment/Plan: 1. Functional deficits secondary to right posterior frontal and corona radiata infarcts causing left upper extremity weakness, also deconditioned from coronary artery disease status post CABG which require 3+ hours per day of interdisciplinary therapy in a comprehensive inpatient rehab setting. Physiatrist is providing close team supervision and 24 hour management of active medical problems listed below. Physiatrist and rehab team continue to assess barriers to discharge/monitor patient progress toward functional and medical goals. Improving neurologic recovery left upper extremity FIM: FIM - Bathing Bathing Steps Patient Completed: Chest;Left Arm;Abdomen;Right upper leg;Left upper leg;Front perineal area;Right lower leg (including foot);Left lower leg (including foot) Bathing: 4: Min-Patient completes 8-9 58f 10 parts or 75+ percent  FIM - Upper Body Dressing/Undressing Upper body dressing/undressing steps patient completed: Put head through opening of pull over shirt/dress;Pull shirt over trunk Upper body dressing/undressing: 3: Mod-Patient completed 50-74% of tasks FIM - Lower Body Dressing/Undressing Lower body dressing/undressing steps patient completed: Thread/unthread right underwear leg;Thread/unthread left underwear leg;Pull underwear up/down Lower body dressing/undressing: 3: Mod-Patient completed 50-74% of tasks  FIM - Toileting Toileting steps completed by  patient: Performs perineal hygiene (not wearing clothing) Toileting: 1: Total-Patient completed zero steps, helper did all 3  FIM - Best boy  Assistive Devices: Therapist, music Transfers: 4-To toilet/BSC: Min A (steadying Pt. > 75%);4-From toilet/BSC: Min A (steadying Pt. > 75%)  FIM - Bed/Chair Transfer Bed/Chair Transfer Assistive Devices: HOB elevated Bed/Chair Transfer: 3: Supine > Sit: Mod A (lifting assist/Pt. 50-74%/lift 2 legs;3: Sit > Supine: Mod A (lifting assist/Pt. 50-74%/lift 2 legs);4: Bed > Chair or W/C: Min A (steadying Pt. > 75%);4: Chair or W/C > Bed: Min A (steadying Pt. > 75%)  FIM - Locomotion: Wheelchair Distance: 50 Locomotion: Wheelchair: 0: Activity did not occur FIM - Locomotion: Ambulation Locomotion: Ambulation Assistive Devices: Chief Operating Officer Ambulation/Gait Assistance: 4: Min assist Locomotion: Ambulation: 0: Activity did not occur  Comprehension Comprehension Mode: Auditory Comprehension: 5-Follows basic conversation/direction: With no assist  Expression Expression Mode: Verbal Expression: 5-Expresses basic needs/ideas: With no assist  Social Interaction Social Interaction: 5-Interacts appropriately 90% of the time - Needs monitoring or encouragement for participation or interaction.  Problem Solving Problem Solving Mode: Asleep Problem Solving: 5-Solves basic 90% of the time/requires cueing < 10% of the time  Memory Memory Mode: Asleep Memory: 4-Recognizes or recalls 75 - 89% of the time/requires cueing 10 - 24% of the time  Medical Problem List and Plan:  1. DVT Prophylaxis/Anticoagulation: Pharmaceutical: Lovenox  2. Pain Management: prn medications effective.  3. Mood: Provide ego support--has supportive girlfriend. Will have LCSW follow for evaluation.  4. Neuropsych: This patient is capable of making decisions on his own behalf.  5. Pericarditis: Continue imipenem through 02/11/13. Leucocytosis resolving.  6.  Antibiotic associated diarrhea: C-diff check 10/21 was negative. Will add probiotics and fiber to help with symptoms.  7. ABLA: Add iron supplement  8. CAD/s/p CABG X 1: sternal precautions.Appreciate CVTS note  Continue ASA/plavix, lisinopril, zocor and metoprolol    LOS (Days) 4 A FACE TO FACE EVALUATION WAS PERFORMED  [name] E 02/08/2013, 9:24 AM

## 2013-02-08 NOTE — Progress Notes (Signed)
Physical Therapy Session Note  Patient Details  Name: [name] MRN: [id number] Date of Birth: 03/16/1961  Today's Date: 02/08/2013 Time: 4098-1191 Time Calculation (min): 37 min  Short Term Goals: Week 1:  PT Short Term Goal 1 (Week 1): Patient will perform bed mobility minA.  PT Short Term Goal 2 (Week 1): Patient will perform stand pivot transfers minA.  PT Short Term Goal 3 (Week 1): Patient will perform wheelchair mobility 100' supervision level with B LE.  PT Short Term Goal 4 (Week 1): Patient will ambulate 72' minA with LRAD.  PT Short Term Goal 5 (Week 1): Patient will negotiate 3 steps minA.   Skilled Therapeutic Interventions/Progress Updates:   Pt resting in bed, denies pain.  Pt reporting need to have BM.  Pt able to verbalize 90% of sternal precautions.  Performed bed mobility training on flat bed, no rail with total verbal and tactile cues to maintain sternal precautions and for sequencing of rolling to L side and side > sit with minimal use of UE; pt required mod A to bring trunk completely upright EOB.  Pt performed stand pivot transfers bed <> BSC without AD and with light min A and performed multiple sit <> stand from BSC and EOB and maintained static standing for short periods of time for hygiene and doffing and donning of briefs with min A and minimal use of UE to stand.  Pt required total A for toileting tasks.  Pt noted to have watery diarrhea; RN alerted.  Began standing LE strengthening and balance exercises with UE support on back of w/c: performed 10 reps heel raises but then requested to return to supine in bed secondary to significant fatigue; pt denied being able to continue with exercises.  Performed sit > supine with total verbal cues and mod A to bring LE fully onto bed.  Pt positioned with all items within reach.      Therapy Documentation Precautions:  Precautions Precautions: Sternal;Fall Precaution Comments: decreased recall of sternal  precautions Restrictions Weight Bearing Restrictions: No General: Amount of Missed PT Time (min): 8 Minutes Missed Time Reason: Patient fatigue Pain: Pain Assessment Pain Assessment: No/denies pain  See FIM for current functional status  Therapy/Group: Individual Therapy  Edman Circle [facility] 02/08/2013, 8:44 AM

## 2013-02-09 ENCOUNTER — Inpatient Hospital Stay (HOSPITAL_COMMUNITY): Payer: Medicaid Other | Admitting: *Deleted

## 2013-02-09 ENCOUNTER — Inpatient Hospital Stay (HOSPITAL_COMMUNITY): Payer: Medicaid Other

## 2013-02-09 ENCOUNTER — Encounter (HOSPITAL_COMMUNITY): Payer: Self-pay

## 2013-02-09 DIAGNOSIS — I6350 Cerebral infarction due to unspecified occlusion or stenosis of unspecified cerebral artery: Secondary | ICD-10-CM

## 2013-02-09 NOTE — Progress Notes (Signed)
Subjective/Complaints: 52 y.o. male with history of CAD and recent NSTEMI 01/05/13 treated medically. He was readmitted on 01/31/13 with recurrent chest pain due to acute pericarditis and cardiac tamponade (Dressler syndrome). He underwent pericardiocentesis 10/11 with improvement in symptoms and drain removed 10/14. On 10/15, he developed LUE sensory loss with left hand weakness and ataxia,left visual deficits. He was noted to be hypotensive, hypoxic with fever and chills. He was intubated and started on broad spectrum antibiotics for sepsis and CT head negative. Repeat echo showed a complex effusion with RV collapse and tamponade. He was taken to OR the same day for CABG X1 with repair of LV ruptured inferoposterior pseudoaneurysm by Dr. Dorris Fetch.  Dr. Luciana Axe and Dr. Pearlean Brownie consulted for input. CTA head/neck with New acute/ subacute nonhemorrhagic infarcts involving the left superior cerebellum, the posterior and medial left parietal lobe and lateral left occipital lobe, the higher left frontal lobe, the posterior right frontal lobe and corona radiata. He was extubated without difficulty. Neurology feels that patient with embolic stroke on unknown source but suspected to be due to sepsis. Antibiotics narrowed to imipenem with recommendations for 14 total days antibiotics-D/C 10/29 Only one therapy yesterday Review of Systems - Negative except Left arm weakness  Objective: Vital Signs: Blood pressure 110/66, pulse 108, temperature 99.4 F (37.4 C), temperature source Oral, resp. rate 17, height 6\' 1"  (1.854 m), weight 98.6 kg (217 lb 6 oz), SpO2 96.00%. No results found. No results found for this or any previous visit (from the past 72 hour(s)).   HEENT: normal Cardio: RRR and No murmurs or extra sounds Resp: CTA B/L and Unlabored GI: BS positive and Nontender nondistended Extremity:  Pulses positive and Edema Left dorsal hand Skin:   Wound C/D/I and Sternotomy wound is well healed. Drain sites  still in completely closed, small amount of dried blood Neuro: Alert/Oriented, Flat, Cranial Nerve II-XII normal, Abnormal Sensory Paresthesias left arm, Normal Motor and Tone:  Hypotonia Musc/Skel:  Other No pain with left shoulder range of motion. General no acute distress Motor strength is 2-/5 in the left deltoid, bicep, tricep,0/5 grip, 4/5 in the left hip flexor, knee extensors, ankle dorsi flexion plantar flexor 5/5 in the right upper and right lower limb  Assessment/Plan: 1. Functional deficits secondary to right posterior frontal and corona radiata infarcts causing left upper extremity weakness, also deconditioned from coronary artery disease status post CABG which require 3+ hours per day of interdisciplinary therapy in a comprehensive inpatient rehab setting. Physiatrist is providing close team supervision and 24 hour management of active medical problems listed below. Physiatrist and rehab team continue to assess barriers to discharge/monitor patient progress toward functional and medical goals.  FIM: FIM - Bathing Bathing Steps Patient Completed: Chest;Left Arm;Abdomen;Right upper leg;Left upper leg;Front perineal area;Right lower leg (including foot);Left lower leg (including foot) Bathing: 4: Min-Patient completes 8-9 52f 10 parts or 75+ percent  FIM - Upper Body Dressing/Undressing Upper body dressing/undressing steps patient completed: Put head through opening of pull over shirt/dress;Pull shirt over trunk Upper body dressing/undressing: 3: Mod-Patient completed 50-74% of tasks FIM - Lower Body Dressing/Undressing Lower body dressing/undressing steps patient completed: Thread/unthread right underwear leg;Thread/unthread left underwear leg;Pull underwear up/down Lower body dressing/undressing: 3: Mod-Patient completed 50-74% of tasks  FIM - Toileting Toileting steps completed by patient: Performs perineal hygiene (not wearing clothing) Toileting: 1: Total-Patient completed  zero steps, helper did all 3  FIM - Diplomatic Services operational officer Devices: Grab bars Toilet Transfers: 4-To toilet/BSC: Min A (steadying  Pt. > 75%);4-From toilet/BSC: Min A (steadying Pt. > 75%)  FIM - Bed/Chair Transfer Bed/Chair Transfer Assistive Devices: HOB elevated Bed/Chair Transfer: 3: Supine > Sit: Mod A (lifting assist/Pt. 50-74%/lift 2 legs;3: Sit > Supine: Mod A (lifting assist/Pt. 50-74%/lift 2 legs);4: Bed > Chair or W/C: Min A (steadying Pt. > 75%);4: Chair or W/C > Bed: Min A (steadying Pt. > 75%)  FIM - Locomotion: Wheelchair Distance: 50 Locomotion: Wheelchair: 0: Activity did not occur FIM - Locomotion: Ambulation Locomotion: Ambulation Assistive Devices: Chief Operating Officer Ambulation/Gait Assistance: 4: Min assist Locomotion: Ambulation: 0: Activity did not occur  Comprehension Comprehension Mode: Auditory Comprehension: 5-Follows basic conversation/direction: With no assist  Expression Expression Mode: Verbal Expression: 5-Expresses basic needs/ideas: With no assist  Social Interaction Social Interaction: 5-Interacts appropriately 90% of the time - Needs monitoring or encouragement for participation or interaction.  Problem Solving Problem Solving Mode: Asleep Problem Solving: 5-Solves basic 90% of the time/requires cueing < 10% of the time  Memory Memory Mode: Asleep Memory: 4-Recognizes or recalls 75 - 89% of the time/requires cueing 10 - 24% of the time  Medical Problem List and Plan:  1. DVT Prophylaxis/Anticoagulation: Pharmaceutical: Lovenox  2. Pain Management: prn medications effective.  3. Mood: Provide ego support--has supportive girlfriend. Will have LCSW follow for evaluation.  4. Neuropsych: This patient is capable of making decisions on his own behalf.  5. Pericarditis: Continue imipenem through 02/11/13. Leucocytosis resolving.  6. Antibiotic associated diarrhea: C-diff check 10/21 was negative. Will add probiotics and fiber to  help with symptoms.  7. ABLA: Add iron supplement  8. CAD/s/p CABG X 1: sternal precautions.Appreciate CVTS note  Continue ASA/plavix, lisinopril, zocor and metoprolol    LOS (Days) 5 A FACE TO FACE EVALUATION WAS PERFORMED  KIRSTEINS,ANDREW E 02/09/2013, 8:18 AM

## 2013-02-09 NOTE — Progress Notes (Signed)
Physical Therapy Session Note  Patient Details  Name: [name] MRN: [id number] Date of Birth: 07-29-1960  Today's Date: 02/09/2013 Time: 4696-2952 Time Calculation (min): 65 min  Short Term Goals: Week 1:  PT Short Term Goal 1 (Week 1): Patient will perform bed mobility minA.  PT Short Term Goal 2 (Week 1): Patient will perform stand pivot transfers minA.  PT Short Term Goal 3 (Week 1): Patient will perform wheelchair mobility 100' supervision level with B LE.  PT Short Term Goal 4 (Week 1): Patient will ambulate 75' minA with LRAD.  PT Short Term Goal 5 (Week 1): Patient will negotiate 3 steps minA.   Skilled Therapeutic Interventions/Progress Updates:    Patient received seated in wheelchair, OT present. Session focused on wheelchair mobility, gait training, functional transfers, stair negotiation, and L LE NMR. See below for details. Patient totalA for donning of sneakers.  Patient able to recall 3/4 sternal precautions. Patients vital taken following gait first gait training seated in wheelchair O2 96%, HR 98, BP 101/73 and again following stair negotiation supine on mat HR 97, O2 96%, patient requires frequent rest breaks. Patient sit<>stand transfers during gait training from wheelchair supervision level, with verbal cues for positioning B UE in lap and not on armrests of chair to avoid pushing through UEs (sternal precautions) and for slow eccentric quad control when stand>sit. Patient stand pivot transfer wheelchair<>mat table supervision level, verbal cues for hand placement. Sit>supine on mat table minA, with verbal cues for technique, manual facilitation provided for guiding of trunk into supine from R sidelying. In supine patient instructed in B LE exercises for strengthening and L LE NMR and coordination, including: heel slides, hip abduction heel slides, and quad sets B LE 1x10reps each. Patient supine>sit modA with manual facilitation for trunk movement from R sidelying to  upright posture. Patient returned to room, stand pivot transfer wheelchair>bed (L side) supervision level, verbal cues for hand placement. Sit>supine modA with manual facilitation for positioning of trunk/shoulders, patient able to manage B LEs. Patient requested to use urinal, required maxA for management of clothing and brief. Ended session patient supine in bed, bed alarm on and all needs within reach.   Therapy Documentation Precautions:  Precautions Precautions: Sternal;Fall Precaution Comments: decreased recall of sternal precautions Restrictions Weight Bearing Restrictions: No   Pain: Pain Assessment Pain Assessment: No/denies pain Pain Score: 0-No pain   Locomotion : Ambulation Ambulation: Yes Ambulation/Gait Assistance: 4: Min guard Ambulation Distance (Feet): 75 Feet (and 60'x1) Assistive device: Large base quad cane Ambulation/Gait Assistance Details: Verbal cues for sequencing;Verbal cues for technique;Verbal cues for precautions/safety;Verbal cues for gait pattern Ambulation/Gait Assistance Details: Patient instructed in gait training 75'x1 LBQC close supervision-min guard with a step through gait pattern. Verbal cues for increased B step length, appropriate pacing, and sequencing with AD. Patient demonstrated increased L weight shift and decreased weight shift R during ambulation. During placement of LBQC when walking patient achieved contact with 2 inside legs only, 2 outer legs not consistently being placed on floor. Patient instructed in gait training 60'x1 no AD minguard-minA step through gait pattern, verbal cues for appropriate pacing and increased B step length, at end of gait training patient had one LOB to the left and anterior, requiring minA for stabilization.  Stairs / Additional Locomotion Stairs: Yes Stairs Assistance: 4: Min assist Stairs Assistance Details: Tactile cues for placement;Verbal cues for sequencing;Verbal cues for technique;Verbal cues for  precautions/safety;Verbal cues for gait pattern Stairs Assistance Details (indicate cue type and  reason): Patient instructed in stair negotiation 5 steps minA with B handrails for support. Verbal cues for not pushing or pulling with UEs on rails secondary to sternal precautions. Verbal cues provided for ascending stairs with uninvolved side and descending stairs with involved side first.  Stair Management Technique: Two rails;Step to pattern Number of Stairs: 5 Height of Stairs: 6 Wheelchair Mobility Wheelchair Mobility: Yes Wheelchair Assistance: 5: Financial planner Details: Verbal cues for precautions/safety;Verbal cues for safe use of DME/AE Wheelchair Propulsion: Both lower extermities Wheelchair Parts Management: Needs assistance Distance: 225'   See FIM for current functional status  Therapy/Group: Individual Therapy  Doralee Albino 02/09/2013, 11:53 AM

## 2013-02-09 NOTE — Progress Notes (Signed)
Social visit  [name] feels well today. In remarkably good spirits.  Pain well controlled  Incision healing well  Day 12/14 antibiotics.

## 2013-02-09 NOTE — Progress Notes (Signed)
PHARMACY NOTE  Pharmacy Consult for :  Primaxin Indication:  Pericarditis with tamponade s/p pericardial drain removal  Hospital Problems Principal Problem:   CVA (cerebral infarction) Active Problems:   Acute pericarditis, unspecified   Leucocytosis   Acute blood loss anemia   Weight: 98 kg  Vitals: BP 110/66  Pulse 108  Temp(Src) 99.4 F (37.4 C) (Oral)  Resp 17  Ht 6\' 1"  (1.854 m)  Wt 217 lb 6 oz (98.6 kg)  BMI 28.69 kg/m2  SpO2 96%  Labs: Estimated Creatinine Clearance: 135 ml/min (by C-G formula based on Cr of 0.73).    Anti-infectives Anti-infectives   Start     Dose/Rate Route Frequency Ordered Stop   02/04/13 2100  imipenem-cilastatin (PRIMAXIN) 500 mg in sodium chloride 0.9 % 100 mL IVPB     500 mg 200 mL/hr over 30 Minutes Intravenous Every 6 hours 02/04/13 1730 02/12/13 2059      Assessment:  Day # 13 Primaxin for pericarditis.  S/P pericardial drain removal.  Renal function stable.  Afebrile, last WBC 13.4.    Plan:   Continue Primaxin as ordered.  Last dose scheduled for 02/12/13.  Laurena Bering, Pharm.D.  02/09/2013 2:49 PM

## 2013-02-09 NOTE — Progress Notes (Signed)
Occupational Therapy Session Note  Patient Details  Name: [name] MRN: [id number] Date of Birth: Apr 05, 1961  Today's Date: 02/09/2013 Time: 0830-0930 and 1330-1400 Time Calculation (min): 60 min and 30 min   Short Term Goals: Week 1:  OT Short Term Goal 1 (Week 1): Pt will complete toilet transfer with min assist OT Short Term Goal 2 (Week 1): Pt will complete shower transfer with min assi OT Short Term Goal 3 (Week 1): Pt will complete bathing with mod assist  OT Short Term Goal 4 (Week 1): Pt will complete LB dressing with mod assist  Skilled Therapeutic Interventions/Progress Updates:    Session 1: Therapy session focused on ADL retraining, functional transfers, activity tolerance, following precautions, and dynamic standing balance. Pt received supine in bed and requesting to use toilet. Ambulated with min assist bed>toilet with LBQC, Pt wearing gown only during task and required steadying assist for hygiene. Pt then ambulated to w/c with min assist. Required rest break secondary to fatigue. Pt verbalized all spinal precautions and required mod verbal cues to follow during functional activities as pt wanting to push up from w/c with RUE. Completed bathing at sink with HOH assist provided to wash RUE. Completed sit<>stand with min assist and required assist for thoroughness for buttocks hygiene. Completed dressing using reacher to thread BLE into pants. Required assist to manage pants in back secondary to precautions. Attempted sock-aid however unsuccessful secondary to LUE hemiplegia. Pt required frequent rest breaks secondary to fatigue during ADL session. Educated and practiced using LUE as gross stabilizer during oral care for holding toothbrush. Pt handed over to PT at end of session.   Session 2: Therapy session focused on NMR to LUE. Pt received supine in bed with fiancee present. Completed supine>sit with supervision and stand pivot transfer bed>w/c with supervision.Total assist  via w/c to gym then pt completed stand pivot transfers w/c>mat table at supervision. Engaged in NMR to LUE using UE ranger focusing on shoulder protraction/retraction, abd/add, elbow flexion/extension, and forearm pronation/supination. Pt demonstrating active pronation >supination. Required assist to begin supination movement then pt able to finished. Practiced self-ROM exercises and provided handout. Pt and fiancee very interested in these and reported he will be completing them each night. Educated on importance of following sternal precautions during self-ROM exercises and provided written reminders on handouts. At end of session pt transferred to sitting EOB with supervision and SLP entered.   Therapy Documentation Precautions:  Precautions Precautions: Sternal;Fall Precaution Comments: decreased recall of sternal precautions Restrictions Weight Bearing Restrictions: No General:   Vital Signs: Therapy Vitals Pulse Rate: 108 BP: 110/66 mmHg Pain: No c/o pain during therapy sessions.     Other Treatments:    See FIM for current functional status  Therapy/Group: Individual Therapy  Daneil Dan 02/09/2013, 10:52 AM

## 2013-02-09 NOTE — Progress Notes (Signed)
Speech Language Pathology Daily Session Note  Patient Details  Name: [name] MRN: [id number] Date of Birth: [birth year]  Today's Date: 02/09/2013 Time: 1400-1445 Time Calculation (min): 45 min  Short Term Goals: Week 1: SLP Short Term Goal 1 (Week 1): Patient will solve functional complex problems with Min verbal cues. SLP Short Term Goal 1 - Progress (Week 1): Progressing toward goal SLP Short Term Goal 2 (Week 1): Patient will utilize external memory aids with Supervision cues. SLP Short Term Goal 2 - Progress (Week 1): Progressing toward goal SLP Short Term Goal 3 (Week 1): Patient will demonstrate alternating attention during a structured task for 10 minutes with Min cues. SLP Short Term Goal 3 - Progress (Week 1): Progressing toward goal SLP Short Term Goal 4 (Week 1): Patient will self-monitor himself during a structured task with Min cues.  Skilled Therapeutic Interventions: Treatment focused on cognitive goals. SLP facilitated session with Min cues for utilization of external aid for recall of daily events. Pt participated in simplified checkbook balancing activity with Mod faded to Min cues for accuracy. Pt selected attention to task in distracting environment without cues for redirection. He demonstrated alternating attention between cognitive task and unrelated conversation, recalling where he had left off with Supervision. Continue plan of care.   FIM:  Comprehension Comprehension Mode: Auditory Comprehension: 5-Follows basic conversation/direction: With no assist Expression Expression Mode: Verbal Expression: 5-Expresses basic needs/ideas: With no assist Problem Solving Problem Solving: 5-Solves basic 90% of the time/requires cueing < 10% of the time Memory Memory: 5-Recognizes or recalls 90% of the time/requires cueing < 10% of the time  Pain Pain Assessment Pain Assessment: No/denies pain Pain Score: 0-No pain Pain Location: Buttocks Pain Intervention(s):  Repositioned  Therapy/Group: Individual Therapy  Maxcine Ham 02/09/2013, 3:12 PM  Maxcine Ham, M.A. CCC-SLP [phone]

## 2013-02-09 NOTE — Progress Notes (Signed)
I have reviewed and agree with the treatment as reflected in this note. Lynea Rollison, PT DPT  

## 2013-02-10 ENCOUNTER — Inpatient Hospital Stay (HOSPITAL_COMMUNITY): Payer: Medicaid Other

## 2013-02-10 ENCOUNTER — Inpatient Hospital Stay (HOSPITAL_COMMUNITY): Payer: Medicaid Other | Admitting: *Deleted

## 2013-02-10 DIAGNOSIS — I2510 Atherosclerotic heart disease of native coronary artery without angina pectoris: Secondary | ICD-10-CM

## 2013-02-10 DIAGNOSIS — I6330 Cerebral infarction due to thrombosis of unspecified cerebral artery: Secondary | ICD-10-CM

## 2013-02-10 DIAGNOSIS — G8110 Spastic hemiplegia affecting unspecified side: Secondary | ICD-10-CM

## 2013-02-10 NOTE — Consult Note (Signed)
INITIAL DIAGNOSTIC EVALUATION - [facility]   MEDICAL NECESSITY:  [name] was seen on the [facility] Unit for an initial diagnostic evaluation owing to the patient's diagnosis of stroke.   According to medical records, [name] was admitted to the rehab unit owing to functional deficits secondary to right posterior frontal and corona radiata infarcts causing left upper extremity weakness. He is also deconditioned from coronary artery disease status post CABG.    [name] was reportedly re-admitted on 01/31/13 with recurrent chest pain due to acute pericarditis and cardiac tamponade (Dressler syndrome). He underwent pericardiocentesis on 10/11 with improvement in symptoms and drain removed on 10/14. On 10/15, he developed left upper extremity sensory loss with left hand weakness and ataxia, as well as left visual field deficits. He was noted to be hypotensive and hypoxic with fever and chills. He was intubated and started on broad spectrum antibiotics for sepsis.   During today's visit, [name] reported noticing memory loss, attention-related deficits, and word finding problems post stroke. His wife concurred. He described his speed of thinking as slow and endorsed left upper extremity weakness. He said that he has been in generally good spirts but mentioned that what has happened to him lately (from a medical perspective) has "been a lot to take in," which is understandable. He describes what sounds like a normal adjustment reaction to his situation with no major indications of depression or anxiety. [name] feels that he is making strides in therapy and he said he has a good social support system. He has also been very happy with his treatment team at [facility].    PROCEDURES ADMINISTERED: [1 unit [id number] on 02/09/13] Diagnostic clinical interview  Review of available records  Emotional & Behavioral Evaluation: Mr.  Tucker was appropriately dressed for season and situation, and he appeared tidy and well-groomed. Normal posture was noted. He was friendly and rapport was easily established. His speech was mildly dysarthric but he was able to express ideas effectively. He seemed to understand test directions readily. His affect was appropriately modulated. Attention and motivation were good.   From an emotional standpoint, [name] denied experiencing any major symptoms of depression or anxiety. He seems to be adjusting well to his stay in the hospital.  Suicidal/homicidal ideation, plan or intent was denied. No manic or hypomanic episodes were reported. The patient denied ever experiencing any auditory/visual hallucinations. No major behavioral or personality changes were endorsed.    Overall, [name] describes experiencing certain cognitive deficits post stroke that he feels are improving. Stroke education about recovery was provided and we discussed obtaining neuropsychological testing to better evaluate cognitive strengths and challenges. He was agreeable. No major mood symptoms appear present.   In light of these findings, the following recommendations are provided.    RECOMMENDATIONS  Recommendations for treatment team:    Complete more thorough neuropsychological screen. This is scheduled with Dr. Wylene Simmer for this Thursday.   DIAGNOSIS:  Cerebral infarction   Debbe Mounts, Psy.D.  Clinical Neuropsychologist

## 2013-02-10 NOTE — Progress Notes (Addendum)
Occupational Therapy Session Note  Patient Details  Name: [name] MRN: [id number] Date of Birth: February 23, 1961  Today's Date: 02/10/2013 Time: 5621-3086 and 1130-1200  Time Calculation (min): 65 min and 30 min   Short Term Goals: Week 1:  OT Short Term Goal 1 (Week 1): Pt will complete toilet transfer with min assist OT Short Term Goal 2 (Week 1): Pt will complete shower transfer with min assi OT Short Term Goal 3 (Week 1): Pt will complete bathing with mod assist  OT Short Term Goal 4 (Week 1): Pt will complete LB dressing with mod assist  Skilled Therapeutic Interventions/Progress Updates:    Session 1: Therapy session focused on functional transfers, LB dressing, and NMR to LUE. Pt received after speech session and was already dressed. Practiced several techniques to don socks as pt reports this is something he would like to do himself. Unable to use sock-aid secondary to no grip strength in L hand. Practiced propping foot up on different height surfaces however unsuccessfull. Used long handled tongs to assist and pt able to don socks partially however continues to require assist. Will continue to problem solve to find successful way to don socks. Practiced using LH shoe horn and pt able to don B shoes requiring assist to tie them. Practiced tub transfer using TTB and LBQC. Completed with min assist on first occasion to manage LLE into tub then completed supervision on second trial. Pt reported needing to complete toileting task. Completed with supervision for standing and mod assist for clothing management. Engaged in NMR to LUE while seated in w/c to work on abd/adduction via sliding canned items down counter with HOH assist to maintain grasp on can. Pt returned to room and transferred w/c>bed supervision and min assist sit>supine.   Session 2: Therapy session focused on NMR to LUE. Pt received supine in bed. Completed supine>sit min assist and total assist for donning shoes secondary to  time. Engaged in active ROM with LUE at high-lo table. Pt demonstrating shoulder protraction/retraction, shoulder abd/add with gravity assisted. Pt demonstrates controlled elbow flex/ext to bring hand to mouth with hand supported. Pt with forearm pronation>supination. Required PROM to complete full ROM then practiced with holding 1 lb weight and controlling pronation. Engaged in PROM and self-ROM to fingers and wrist. Pt with slight flexion of second digit and very motivated by this. At end of session pt returned to room and left sitting up in w/c for lunch.   Therapy Documentation Precautions:  Precautions Precautions: Sternal;Fall Precaution Comments: decreased recall of sternal precautions Restrictions Weight Bearing Restrictions: No General:   Vital Signs: Therapy Vitals Pulse Rate: 108 BP: 92/66 mmHg Pain: No c/o pain during therapy sessions.   See FIM for current functional status  Therapy/Group: Individual Therapy  Daneil Dan 02/10/2013, 10:58 AM

## 2013-02-10 NOTE — Progress Notes (Signed)
Physical Therapy Session Note  Patient Details  Name: [name] MRN: [id number] Date of Birth: 22-Jul-1960  Today's Date: 02/10/2013 Time: 1300-1400 Time Calculation (min): 60 min  Short Term Goals: Week 1:  PT Short Term Goal 1 (Week 1): Patient will perform bed mobility minA.  PT Short Term Goal 2 (Week 1): Patient will perform stand pivot transfers minA.  PT Short Term Goal 3 (Week 1): Patient will perform wheelchair mobility 100' supervision level with B LE.  PT Short Term Goal 4 (Week 1): Patient will ambulate 58' minA with LRAD.  PT Short Term Goal 5 (Week 1): Patient will negotiate 3 steps minA.   Skilled Therapeutic Interventions/Progress Updates:    Patient received supine in bed, shoes donned. Session focused on functional transfers, gait training, and wheelchair mobility. See below for details. Patient able to recall 4/4 sternal precautions. Patient supine>sit supervision level HOB elevated, verbal cues for hand placement. Stand pivot transfer bed>wheelchair supervision level no AD. Patient c/o dizziness 3-4/10 following first gait trial, patient requested to lie down, sit>supine on mat table supervision level. Vitals taken in supine O2 96%, HR 105, BP 93/66. Patient sit>supine supervision level, verbal cues for hand placement. Vitals taken again seated edge of mat HR 103, O2 96%, patient reports decrease in dizziness.. In ADL apartment sit<>stand transfer from low couch, supervision level, verbal cues for anterior scoot and anterior weight shift. Patient returned to room totalA wheelchair mobility. Stand pivot transfer wheelchair>bed no AD supervision level, sit>supine HOB flat minA, with manual facilitation for control of trunk. Verbal cues for using B LEs to scoot to head of bed and not to pull/push with UEs. Ended session patient supine in bed, alarm on with all needs within reach.  Therapy Documentation Precautions:  Precautions Precautions: Sternal;Fall Precaution  Comments: decreased recall of sternal precautions Restrictions Weight Bearing Restrictions: No Pain: Pain Assessment Pain Assessment: No/denies pain Pain Score: 0-No pain   Locomotion : Ambulation Ambulation: Yes Ambulation/Gait Assistance: 5: Supervision Ambulation Distance (Feet): 75 Feet (x2 and 25'x1) Ambulation/Gait Assistance Details: Verbal cues for gait pattern;Verbal cues for precautions/safety;Verbal cues for sequencing Ambulation/Gait Assistance Details: Patient instructed in gait training 75'x2 LBQC in community environment supervision level, patient required to navigate obstacles and other people while ambulating, no LOB noted. Patient instructed in gait training 25'x1 [facility] in home environment supervision, required to navigate carpet and thresholds. Verbal cues provided during all gait traning for increased B step length and sequencing with AD, emphasis on proper use of LBQC by acheiving contact of all 4 legs of cane with the floor.  Corporate treasurer: Yes Wheelchair Assistance: 5: Investment banker, operational: Both lower extermities Wheelchair Parts Management: Needs assistance Distance: 150'x1   See FIM for current functional status  Therapy/Group: Individual Therapy  Doralee Albino 02/10/2013, 4:07 PM

## 2013-02-10 NOTE — Progress Notes (Signed)
Speech Language Pathology Daily Session Note  Patient Details  Name: [name] MRN: [id number] Date of Birth: 12-16-1960  Today's Date: 02/10/2013 Time: 4098-1191 Time Calculation (min): 45 min  Short Term Goals: Week 1: SLP Short Term Goal 1 (Week 1): Patient will solve functional complex problems with Min verbal cues. SLP Short Term Goal 1 - Progress (Week 1): Progressing toward goal SLP Short Term Goal 2 (Week 1): Patient will utilize external memory aids with Supervision cues. SLP Short Term Goal 2 - Progress (Week 1): Progressing toward goal SLP Short Term Goal 3 (Week 1): Patient will demonstrate alternating attention during a structured task for 10 minutes with Min cues. SLP Short Term Goal 3 - Progress (Week 1): Progressing toward goal SLP Short Term Goal 4 (Week 1): Patient will self-monitor himself during a structured task with Min cues.  Skilled Therapeutic Interventions: Treatment focused on cognitive goals. Upon SLP arrival, pt reported that he was not feeling well because he had to have a BM. SLP reviewed purpose of call bell for requesting assistance. Prior to transfer, pt recalled 2/4 sternal precautions with Mod I. Pt followed commands to complete toileting, bathing, and dressing tasks with Mod I however required Min cues for basic problem solving. Continue plan of care.   FIM:  Comprehension Comprehension Mode: Auditory Comprehension: 5-Follows basic conversation/direction: With extra time/assistive device Expression Expression Mode: Verbal Expression: 5-Expresses basic 90% of the time/requires cueing < 10% of the time. Social Interaction Social Interaction: 5-Interacts appropriately 90% of the time - Needs monitoring or encouragement for participation or interaction. Problem Solving Problem Solving: 5-Solves basic 90% of the time/requires cueing < 10% of the time Memory Memory: 4-Recognizes or recalls 75 - 89% of the time/requires cueing 10 - 24% of the  time  Pain Pain Assessment Pain Assessment: No/denies pain Pain Score: 0-No pain  Therapy/Group: Individual Therapy  Maxcine Ham 02/10/2013, 10:28 AM  Maxcine Ham, M.A. CCC-SLP [phone]

## 2013-02-10 NOTE — Progress Notes (Signed)
Subjective/Complaints: 52 y.o. male with history of CAD and recent NSTEMI 01/05/13 treated medically. He was readmitted on 01/31/13 with recurrent chest pain due to acute pericarditis and cardiac tamponade (Dressler syndrome). He underwent pericardiocentesis 10/11 with improvement in symptoms and drain removed 10/14. On 10/15, he developed LUE sensory loss with left hand weakness and ataxia,left visual deficits. He was noted to be hypotensive, hypoxic with fever and chills. He was intubated and started on broad spectrum antibiotics for sepsis and CT head negative. Repeat echo showed a complex effusion with RV collapse and tamponade. He was taken to OR the same day for CABG X1 with repair of LV ruptured inferoposterior pseudoaneurysm by Dr. Dorris Fetch.  Dr. Luciana Axe and Dr. Pearlean Brownie consulted for input. CTA head/neck with New acute/ subacute nonhemorrhagic infarcts involving the left superior cerebellum, the posterior and medial left parietal lobe and lateral left occipital lobe, the higher left frontal lobe, the posterior right frontal lobe and corona radiata. He was extubated without difficulty. Neurology feels that patient with embolic stroke on unknown source but suspected to be due to sepsis. Antibiotics narrowed to imipenem with recommendations for 14 total days antibiotics-D/C 10/29  No issues yesterday. Appreciate CVTS note Review of Systems - Negative except Left arm weakness  Objective: Vital Signs: Blood pressure 92/66, pulse 108, temperature 99.4 F (37.4 C), temperature source Oral, resp. rate 18, height 6\' 1"  (1.854 m), weight 96.3 kg (212 lb 4.9 oz), SpO2 96.00%. No results found. No results found for this or any previous visit (from the past 72 hour(s)).   HEENT: normal Cardio: RRR and No murmurs or extra sounds Resp: CTA B/L and Unlabored GI: BS positive and Nontender nondistended Extremity:  Pulses positive and Edema Left dorsal hand Skin:   Wound C/D/I and Sternotomy wound is well  healed. Drain sites still in completely closed, small amount of dried blood Neuro: Alert/Oriented, Flat, Cranial Nerve II-XII normal, Abnormal Sensory Paresthesias left arm, Normal Motor and Tone:  Hypotonia Musc/Skel:  Other No pain with left shoulder range of motion. General no acute distress Motor strength is 2-/5 in the left deltoid, bicep, tricep,0/5 grip, 4/5 in the left hip flexor, knee extensors, ankle dorsi flexion plantar flexor 5/5 in the right upper and right lower limb  Assessment/Plan: 1. Functional deficits secondary to right posterior frontal and corona radiata infarcts causing left upper extremity weakness, also deconditioned from coronary artery disease status post CABG which require 3+ hours per day of interdisciplinary therapy in a comprehensive inpatient rehab setting. Physiatrist is providing close team supervision and 24 hour management of active medical problems listed below. Physiatrist and rehab team continue to assess barriers to discharge/monitor patient progress toward functional and medical goals.  FIM: FIM - Bathing Bathing Steps Patient Completed: Chest;Left Arm;Abdomen;Front perineal area (completed UB bathing only) Bathing: 3: Mod-Patient completes 5-7 71f 10 parts or 50-74%  FIM - Upper Body Dressing/Undressing Upper body dressing/undressing steps patient completed: Put head through opening of pull over shirt/dress;Pull shirt over trunk;Thread/unthread right sleeve of pullover shirt/dresss Upper body dressing/undressing: 4: Min-Patient completed 75 plus % of tasks FIM - Lower Body Dressing/Undressing Lower body dressing/undressing steps patient completed: Thread/unthread right underwear leg;Thread/unthread left underwear leg;Thread/unthread right pants leg;Thread/unthread left pants leg Lower body dressing/undressing: 3: Mod-Patient completed 50-74% of tasks  FIM - Toileting Toileting steps completed by patient: Performs perineal hygiene (not wearing  clothing) Toileting: 4: Steadying assist  FIM - Diplomatic Services operational officer Devices: Grab bars;Occupational hygienist Transfers: 4-To toilet/BSC: Min A (steadying Pt. >  75%);4-From toilet/BSC: Min A (steadying Pt. > 75%)  FIM - Bed/Chair Transfer Bed/Chair Transfer Assistive Devices: HOB elevated Bed/Chair Transfer: 4: Sit > Supine: Min A (steadying pt. > 75%/lift 1 leg);3: Supine > Sit: Mod A (lifting assist/Pt. 50-74%/lift 2 legs;5: Bed > Chair or W/C: Supervision (verbal cues/safety issues);5: Chair or W/C > Bed: Supervision (verbal cues/safety issues)  FIM - Locomotion: Wheelchair Distance: 225' Locomotion: Wheelchair: 5: Travels 150 ft or more: maneuvers on rugs and over door sills with supervision, cueing or coaxing FIM - Locomotion: Ambulation Locomotion: Ambulation Assistive Devices: Occupational hygienist [facility]) Ambulation/Gait Assistance: 4: Min guard Locomotion: Ambulation: 2: Travels 50 - 149 ft with minimal assistance (Pt.>75%)  Comprehension Comprehension Mode: Auditory Comprehension: 5-Follows basic conversation/direction: With no assist  Expression Expression Mode: Verbal Expression: 5-Expresses basic needs/ideas: With no assist  Social Interaction Social Interaction: 5-Interacts appropriately 90% of the time - Needs monitoring or encouragement for participation or interaction.  Problem Solving Problem Solving Mode: Asleep Problem Solving: 5-Solves basic 90% of the time/requires cueing < 10% of the time  Memory Memory Mode: Asleep Memory: 5-Recognizes or recalls 90% of the time/requires cueing < 10% of the time  Medical Problem List and Plan:  1. DVT Prophylaxis/Anticoagulation: Pharmaceutical: Lovenox  2. Pain Management: prn medications effective.  3. Mood: Provide ego support--has supportive girlfriend. Will have LCSW follow for evaluation.  4. Neuropsych: This patient is capable of making decisions on his own behalf.  5. Pericarditis: Continue imipenem through  02/11/13. Leucocytosis resolving.  6. Antibiotic associated diarrhea: 7. ABLA: Add iron supplement  8. CAD/s/p CABG X 1: sternal precautions.Appreciate CVTS note  ? Can drain site sutures be removed?Continue ASA/plavix, lisinopril, zocor and metoprolol    LOS (Days) 6 A FACE TO FACE EVALUATION WAS PERFORMED  KIRSTEINS,ANDREW E 02/10/2013, 7:50 AM

## 2013-02-10 NOTE — Progress Notes (Signed)
I have reviewed and agree with the treatment as reflected in this note. Markee Matera, PT DPT  

## 2013-02-11 ENCOUNTER — Inpatient Hospital Stay (HOSPITAL_COMMUNITY): Payer: Medicaid Other | Admitting: Speech Pathology

## 2013-02-11 ENCOUNTER — Encounter (HOSPITAL_COMMUNITY): Payer: Self-pay

## 2013-02-11 ENCOUNTER — Inpatient Hospital Stay (HOSPITAL_COMMUNITY): Payer: Medicaid Other | Admitting: *Deleted

## 2013-02-11 LAB — BASIC METABOLIC PANEL
BUN: 11 mg/dL (ref 6–23)
CO2: 23 mEq/L (ref 19–32)
Calcium: 9.3 mg/dL (ref 8.4–10.5)
Chloride: 91 mEq/L — ABNORMAL LOW (ref 96–112)
Creatinine, Ser: 0.78 mg/dL (ref 0.50–1.35)
GFR calc non Af Amer: >90 mL/min (ref >90)

## 2013-02-11 LAB — CREATININE, SERUM
GFR calc Af Amer: >90 mL/min (ref >90)
GFR calc non Af Amer: >90 mL/min (ref >90)

## 2013-02-11 MED ORDER — POTASSIUM CHLORIDE CRYS ER 20 MEQ PO TBCR
20.0000 meq | EXTENDED_RELEASE_TABLET | Freq: Two times a day (BID) | ORAL | Status: DC
  Administered 2013-02-11 – 2013-02-12 (×3): 20 meq via ORAL
  Filled 2013-02-11 (×5): qty 1

## 2013-02-11 MED ORDER — ASPIRIN EC 325 MG PO TBEC
325.0000 mg | DELAYED_RELEASE_TABLET | Freq: Every day | ORAL | Status: DC
  Administered 2013-02-12 – 2013-02-14 (×3): 325 mg via ORAL
  Filled 2013-02-11 (×4): qty 1

## 2013-02-11 NOTE — Progress Notes (Signed)
Occupational Therapy Note  Patient Details  Name: [name] MRN: [id number] Date of Birth: 03/09/1961 Today's Date: 02/11/2013  Pt missing 60 min skilled OT intervention secondary to nausea. Pt reporting 8/10 nausea and declining therapy at this time. Encouraged to sit EOB to see if nausea would relieve however pt declining. RN notified and reported being aware of this.    Ottis Sarnowski N 02/11/2013, 8:39 AM

## 2013-02-11 NOTE — Progress Notes (Signed)
I have reviewed and agree with the treatment as reflected in this note. Phillip Sandler, PT DPT  

## 2013-02-11 NOTE — Progress Notes (Signed)
Social Work Patient ID: [name], male   DOB: 10-14-1960, 52 y.o.   MRN: [id number] Met with pt and step-daughter to discuss team conference goals-supervision/min level and discharge 11/1.  Discussed with IV antibiotics begin finished then hopefully would feel  Better and his bowel issues would be lessened.  iIll continue to monitor his progress and have family come in for family edcaution.  Will make discharge arrangements.

## 2013-02-11 NOTE — Plan of Care (Signed)
Problem: RH Simple Meal Prep Goal: LTG Patient will perform simple meal prep w/assist (OT) LTG: Patient will perform simple meal prep with assistance, with/without cues (OT).  Outcome: Not Applicable Date Met:  02/11/13 Pt reports he will be getting assistance with this task now  Problem: RH Laundry Goal: LTG Patient will perform laundry w/assist, cues (OT) LTG: Patient will perform laundry with assistance, with/without cues (OT).  Outcome: Not Applicable Date Met:  02/11/13 Pt reports he will be getting assistance with this task now

## 2013-02-11 NOTE — Progress Notes (Signed)
Physical Therapy Session Note  Patient Details  Name: [name] MRN: [id number] Date of Birth: 1960-09-29  Today's Date: 02/11/2013 Time: 4098-1191 Time Calculation (min): 40 min  Short Term Goals: Week 1:  PT Short Term Goal 1 (Week 1): Patient will perform bed mobility minA.  PT Short Term Goal 2 (Week 1): Patient will perform stand pivot transfers minA.  PT Short Term Goal 3 (Week 1): Patient will perform wheelchair mobility 100' supervision level with B LE.  PT Short Term Goal 4 (Week 1): Patient will ambulate 27' minA with LRAD.  PT Short Term Goal 5 (Week 1): Patient will negotiate 3 steps minA.   Skilled Therapeutic Interventions/Progress Updates:    Patient received supine in bed. Session focused on bed mobility, functional transfers, gait training, and wheelchair mobility. See below for details. Patient supine>sit HOB elevated supervision, verbal cues for not pushing through UEs. Seated EOB, patient sit>stand supervision, patient required assistance threading pants and pulling them up in standing while maintaining standing balance supervision. Seated EOB patient required assistance for management of L UE and awareness of sternal precautions when donning shirt. Patient stand pivot transfer supervision level bed>wheelchair. Patient instructed in wheelchair mobility 125'x1 B LE supervision level, with cuing for upright seated posture in chair while propelling. Patients vitals taken following w/c proprulsion, seated in wheelchair O2 94% HR 109, patient required multiple rest breaks throughout session. Patient seated edge of mat table, requested to lie down, sit<>supine supervision level, verbal cues for sternal precautions when coming supine>sit. Patient stand pivot transfer mat>wheelchair no AD supervision level. Patient returned to room, seated in recliner and all needs within reach. Nursing present.   Therapy Documentation Precautions:  Precautions Precautions:  Sternal;Fall Precaution Comments: decreased recall of sternal precautions Restrictions Weight Bearing Restrictions: No Pain: Pain Assessment Pain Assessment: No/denies pain Pain Score: 0-No pain   Locomotion : Ambulation Ambulation: Yes Ambulation/Gait Assistance: 5: Supervision Ambulation Distance (Feet): 55 Feet Assistive device: Large base quad cane Ambulation/Gait Assistance Details: Verbal cues for precautions/safety;Verbal cues for safe use of DME/AE Ambulation/Gait Assistance Details: Patient instructed in gait training 55'x1 [facility] supervision level, verbal cues for maintaining contact of all 4 legs quad cane with floor.  Corporate treasurer: Yes Wheelchair Assistance: 5: Financial planner Details: Verbal cues for Engineer, drilling: Both lower extermities Wheelchair Parts Management: Needs assistance Distance: 125'x1   See FIM for current functional status  Therapy/Group: Individual Therapy  Doralee Albino 02/11/2013, 3:01 PM

## 2013-02-11 NOTE — Progress Notes (Signed)
I have reviewed and agree with the treatment as reflected in this note. Arshi Duarte, PT DPT  

## 2013-02-11 NOTE — Progress Notes (Signed)
Subjective/Complaints: 52 y.o. male with history of CAD and recent NSTEMI 01/05/13 treated medically. He was readmitted on 01/31/13 with recurrent chest pain due to acute pericarditis and cardiac tamponade (Dressler syndrome). He underwent pericardiocentesis 10/11 with improvement in symptoms and drain removed 10/14. On 10/15, he developed LUE sensory loss with left hand weakness and ataxia,left visual deficits. He was noted to be hypotensive, hypoxic with fever and chills. He was intubated and started on broad spectrum antibiotics for sepsis and CT head negative. Repeat echo showed a complex effusion with RV collapse and tamponade. He was taken to OR the same day for CABG X1 with repair of LV ruptured inferoposterior pseudoaneurysm by Dr. Dorris Fetch.  Dr. Luciana Axe and Dr. Pearlean Brownie consulted for input. CTA head/neck with New acute/ subacute nonhemorrhagic infarcts involving the left superior cerebellum, the posterior and medial left parietal lobe and lateral left occipital lobe, the higher left frontal lobe, the posterior right frontal lobe and corona radiata. He was extubated without difficulty. Neurology feels that patient with embolic stroke on unknown source but suspected to be due to sepsis. Antibiotics narrowed to imipenem with recommendations for 14 total days antibiotics-D/C 10/29  No chest pain or shortness of breath Review of Systems - Negative except Left arm weakness  Objective: Vital Signs: Blood pressure 108/68, pulse 102, temperature 98.7 F (37.1 C), temperature source Oral, resp. rate 18, height 6\' 1"  (1.854 m), weight 93.26 kg (205 lb 9.6 oz), SpO2 93.00%. No results found. Results for orders placed during the hospital encounter of 02/04/13 (from the past 72 hour(s))  CREATININE, SERUM     Status: None   Collection Time    02/11/13  5:32 AM      Result Value Range   Creatinine, Ser 0.78  0.50 - 1.35 mg/dL   GFR calc non Af Amer >90  >90 mL/min   GFR calc Af Amer >90  >90 mL/min   Comment: (NOTE)     The eGFR has been calculated using the CKD EPI equation.     This calculation has not been validated in all clinical situations.     eGFR's persistently <90 mL/min signify possible Chronic Kidney     Disease.     HEENT: normal Cardio: RRR and No murmurs or extra sounds Resp: CTA B/L and Unlabored GI: BS positive and Nontender nondistended Extremity:  Pulses positive and Edema Left dorsal hand Skin:   Wound C/D/I and Sternotomy wound is well healed. Drain sites still in completely closed, small amount of dried blood Neuro: Alert/Oriented, Flat, Cranial Nerve II-XII normal, Abnormal Sensory Paresthesias left arm, Normal Motor and Tone:  Hypotonia Musc/Skel:  Other No pain with left shoulder range of motion. General no acute distress Motor strength is 2-/5 in the left deltoid, bicep, tricep,1/5 grip, 4/5 in the left hip flexor, knee extensors, ankle dorsi flexion plantar flexor 5/5 in the right upper and right lower limb  Assessment/Plan: 1. Functional deficits secondary to right posterior frontal and corona radiata infarcts causing left upper extremity weakness, also deconditioned from coronary artery disease status post CABG which require 3+ hours per day of interdisciplinary therapy in a comprehensive inpatient rehab setting. Physiatrist is providing close team supervision and 24 hour management of active medical problems listed below. Physiatrist and rehab team continue to assess barriers to discharge/monitor patient progress toward functional and medical goals. Team conference today please see physician documentation under team conference tab, met with team face-to-face to discuss problems,progress, and goals. Formulized individual treatment plan based on medical  history, underlying problem and comorbidities. FIM: FIM - Bathing Bathing Steps Patient Completed: Chest;Left Arm;Abdomen;Front perineal area (completed UB bathing only) Bathing: 3: Mod-Patient completes 5-7  62f 10 parts or 50-74%  FIM - Upper Body Dressing/Undressing Upper body dressing/undressing steps patient completed: Put head through opening of pull over shirt/dress;Pull shirt over trunk;Thread/unthread right sleeve of pullover shirt/dresss Upper body dressing/undressing: 4: Min-Patient completed 75 plus % of tasks FIM - Lower Body Dressing/Undressing Lower body dressing/undressing steps patient completed: Thread/unthread right underwear leg;Thread/unthread left underwear leg;Thread/unthread right pants leg;Thread/unthread left pants leg Lower body dressing/undressing: 3: Mod-Patient completed 50-74% of tasks  FIM - Toileting Toileting steps completed by patient: Adjust clothing after toileting;Performs perineal hygiene Toileting: 3: Mod-Patient completed 2 of 3 steps  FIM - Diplomatic Services operational officer Devices: Occupational hygienist Transfers: 5-To toilet/BSC: Supervision (verbal cues/safety issues);5-From toilet/BSC: Supervision (verbal cues/safety issues)  FIM - Press photographer Assistive Devices: HOB elevated Bed/Chair Transfer: 5: Supine > Sit: Supervision (verbal cues/safety issues);4: Sit > Supine: Min A (steadying pt. > 75%/lift 1 leg);5: Bed > Chair or W/C: Supervision (verbal cues/safety issues);5: Chair or W/C > Bed: Supervision (verbal cues/safety issues)  FIM - Locomotion: Wheelchair Distance: 150'x1 Locomotion: Wheelchair: 5: Travels 150 ft or more: maneuvers on rugs and over door sills with supervision, cueing or coaxing FIM - Locomotion: Ambulation Locomotion: Ambulation Assistive Devices: Occupational hygienist [facility]) Ambulation/Gait Assistance: 5: Supervision Locomotion: Ambulation: 2: Travels 50 - 149 ft with supervision/safety issues  Comprehension Comprehension Mode: Auditory Comprehension: 5-Follows basic conversation/direction: With extra time/assistive device  Expression Expression Mode: Verbal Expression: 5-Expresses basic 90% of the  time/requires cueing < 10% of the time.  Social Interaction Social Interaction: 5-Interacts appropriately 90% of the time - Needs monitoring or encouragement for participation or interaction.  Problem Solving Problem Solving Mode: Asleep Problem Solving: 5-Solves basic 90% of the time/requires cueing < 10% of the time  Memory Memory Mode: Asleep Memory: 4-Recognizes or recalls 75 - 89% of the time/requires cueing 10 - 24% of the time  Medical Problem List and Plan:  1. DVT Prophylaxis/Anticoagulation: Pharmaceutical: Lovenox  2. Pain Management: prn medications effective.  3. Mood: Provide ego support--has supportive girlfriend. Will have LCSW follow for evaluation.  4. Neuropsych: This patient is capable of making decisions on his own behalf.  5. Pericarditis: Continue imipenem through 02/11/13. Leucocytosis resolving.  6. Antibiotic associated diarrhea: 7. ABLA: Add iron supplement  8. CAD/s/p CABG X 1: sternal precautions.Appreciate CVTS note Continue ASA/plavix, lisinopril, zocor and metoprolol    LOS (Days) 7 A FACE TO FACE EVALUATION WAS PERFORMED  Najae Rathert E 02/11/2013, 9:02 AM

## 2013-02-11 NOTE — Patient Care Conference (Signed)
Inpatient RehabilitationTeam Conference and Plan of Care Update Date: 02/11/2013   Time: 10;30 AM    Patient Name: [name]      Medical Record Number: [id number]  Date of Birth: 25-Jun-1960 Sex: Male         Room/Bed: 4W03C/4W03C-01 Payor Info: Payor: MEDICAID PENDING / Plan: MEDICAID PENDING / Product Type: *No Product type* /    Admitting Diagnosis: CVA,Pericarditis  Admit Date/Time:  02/04/2013  5:14 PM Admission Comments: No comment available   Primary Diagnosis:  CVA (cerebral infarction) Principal Problem: CVA (cerebral infarction)  Patient Active Problem List   Diagnosis Date Noted  . Leucocytosis 02/05/2013  . Acute blood loss anemia 02/05/2013  . CVA (cerebral infarction) 01/28/2013  . Pericardial tamponade 01/23/2013  . Acute pericarditis, unspecified 01/22/2013  . Fever 01/22/2013  . Chest pain 01/21/2013  . Hypokalemia 01/07/2013  . Non-STEMI (non-ST elevated myocardial infarction) 01/05/2013    Expected Discharge Date: Expected Discharge Date: 02/14/13  Team Members Present: Physician leading conference: Dr. Claudette Laws Social Worker Present: Dossie Der, LCSW;Jenny Prevatt, LCSW Nurse Present: Other (comment) (Whitney Readron-RN) PT Present: Bridgett Ripa, Lillie Columbia, PT OT Present: Scherrie November, Loistine Chance, OT SLP Present: Maxcine Ham, SLP PPS Coordinator present : Edson Snowball, Chapman Fitch, RN, CRRN     Current Status/Progress Goal Weekly Team Focus  Medical   Sternal incision healing well, last day of IV antibiotics today  Maintain medical stability  Monitor off IV antibiotics   Bowel/Bladder   Continent of bowel and bladder- uses urinal  Remain continent of bowel and bladder  cont of bowel and bladder   Swallow/Nutrition/ Hydration     [facility]        ADL's   supervision functional transfers, recalls 3/4 sternal precautions, mod assist LB dressing, mod assist toilet task for clothing management, min assist UB  dressing   supervision overall   LB dressing, hemi dressing technique, education, activity tolerance, L NMR, strengthening    Mobility   MinA bed mobility and 5 steps, supervision level stand pivot transfers, wheelchair mobility 150' and gait training 25'  Supervision overall   bed mobility, functional transfers, wheelchair mobility, gait training, stair negotiation, activity tolerance, balance, coordination, safety.   Communication     [facility]        Safety/Cognition/ Behavioral Observations  Min cues basic problem solving, use of external aids  Supervision-Mod I  more complex problem solving, use of external aids, alternating attention   Pain   Denies pain  < 3  Assess, monitor, and treat for pain q shift and prn   Skin   Midline incision- steristrips/skin glue; puncture site to right upper chest- skin glue; puncture site to left and right abdomen- sutures removed- gauze dressing  Remain free from skin breakdown or infection  Assess and monitor patients skin q shift and prn      *See Care Plan and progress notes for long and short-term goals.  Barriers to Discharge: Deconditioning as well as left upper extremity weakness    Possible Resolutions to Barriers:  Continue rehabilitation program    Discharge Planning/Teaching Needs:  Home with daughter and girlfriend also to assist.  DOing well and pleased with his progress      Team Discussion:  Making good progress.  Sternal precautions limit his independence.  Sutures removed today.  Nauseous last few days.  Revisions to Treatment Plan: Downgraded goals for UE B&D to min level   Continued Need for Acute Rehabilitation Level of Care:  The patient requires daily medical management by a physician with specialized training in physical medicine and rehabilitation for the following conditions: Daily direction of a multidisciplinary physical rehabilitation program to ensure safe treatment while eliciting the highest outcome that is of practical  value to the patient.: Yes Daily medical management of patient stability for increased activity during participation in an intensive rehabilitation regime.: Yes Daily analysis of laboratory values and/or radiology reports with any subsequent need for medication adjustment of medical intervention for : Neurological problems;Other;Cardiac problems;Post surgical problems  [name] 02/11/2013, 1:46 PM

## 2013-02-11 NOTE — Plan of Care (Signed)
Problem: RH Dressing Goal: LTG Patient will perform lower body dressing w/assist (OT) LTG: Patient will perform lower body dressing with assist, with/without cues in positioning using equipment (OT)  Downgraded to mod assist as pt requires assist for socks and fastening shoes. Have trialed various techniques with AE, etc.   Problem: RH Toileting Goal: LTG Patient will perform toileting w/assist, cues/equip (OT) LTG: Patient will perform toiletiing (clothes management/hygiene) with assist, with/without cues using equipment (OT)  Downgraded to mod assist as pt requires assist with buttocks hygiene following BM d/t sternal precautions

## 2013-02-11 NOTE — Progress Notes (Signed)
Physical Therapy Note  Patient Details  Name: [name] MRN: [id number] Date of Birth: 09-Sep-1960 Today's Date: 02/11/2013  Patient missed 45 minutes skilled physical therapy, secondary to c/o nausea. Patient reports he hasn't felt well all morning, vitals taken supine in bed O2 97%, HR 102, BP 98/63. Patient encouraged to perform bed exercises, reports "not this morning, maybe this afternoon". Patients nurse notified patient refusing therapy this AM, nurse reports patient doesn't eat much in the morning prior to taking his medications, nursing present to provide medication for patient. Will follow up as able.   Followed up later this AM, patient continuing to refuse therapy secondary to nausea and not feeling well. Will continue to follow up as able.   Doralee Albino 02/11/2013, 10:11 AM

## 2013-02-11 NOTE — Progress Notes (Signed)
Speech Language Pathology Daily Session Note  Patient Details  Name: [name] MRN: [id number] Date of Birth: July 15, 1960  Today's Date: 02/11/2013 Time: 1015-1055 Time Calculation (min): 40 min  Short Term Goals: Week 1: SLP Short Term Goal 1 (Week 1): Patient will solve functional complex problems with Min verbal cues. SLP Short Term Goal 1 - Progress (Week 1): Progressing toward goal SLP Short Term Goal 2 (Week 1): Patient will utilize external memory aids with Supervision cues. SLP Short Term Goal 2 - Progress (Week 1): Progressing toward goal SLP Short Term Goal 3 (Week 1): Patient will demonstrate alternating attention during a structured task for 10 minutes with Min cues. SLP Short Term Goal 3 - Progress (Week 1): Progressing toward goal SLP Short Term Goal 4 (Week 1): Patient will self-monitor himself during a structured task with Min cues.  Skilled Therapeutic Interventions: Treatment focus on cognitive goals. SLP facilitated session by providing supervision question cues for recall of current sternal precautions with use of external memory aid. Pt participated in new learning task with a mildly complex card game. Pt required Mod A verbal and question cues for problem solving and organization throughout the task. Pt also demonstrated appropriate emergent awareness into difficulty of task. Continue current plan of care.    FIM:  Comprehension Comprehension Mode: Auditory Comprehension: 5-Follows basic conversation/direction: With extra time/assistive device Expression Expression Mode: Verbal Expression: 5-Expresses complex 90% of the time/cues < 10% of the time Social Interaction Social Interaction: 5-Interacts appropriately 90% of the time - Needs monitoring or encouragement for participation or interaction. Problem Solving Problem Solving: 5-Solves basic 90% of the time/requires cueing < 10% of the time Memory Memory: 5-Recognizes or recalls 90% of the time/requires  cueing < 10% of the time FIM - Eating Eating Activity: 5: Set-up assist for open containers  Pain Pain Assessment Pain Assessment: No/denies pain Pain Score: 0-No pain  Therapy/Group: Individual Therapy  [name] 02/11/2013, 4:11 PM

## 2013-02-12 ENCOUNTER — Inpatient Hospital Stay (HOSPITAL_COMMUNITY): Payer: Medicaid Other

## 2013-02-12 ENCOUNTER — Inpatient Hospital Stay (HOSPITAL_COMMUNITY): Payer: Medicaid Other | Admitting: *Deleted

## 2013-02-12 MED ORDER — PANTOPRAZOLE SODIUM 40 MG PO TBEC
40.0000 mg | DELAYED_RELEASE_TABLET | Freq: Two times a day (BID) | ORAL | Status: DC
  Filled 2013-02-12: qty 1

## 2013-02-12 MED ORDER — SACCHAROMYCES BOULARDII 250 MG PO CAPS
500.0000 mg | ORAL_CAPSULE | Freq: Two times a day (BID) | ORAL | Status: DC
  Administered 2013-02-12 – 2013-02-14 (×4): 500 mg via ORAL
  Filled 2013-02-12 (×6): qty 2

## 2013-02-12 MED ORDER — LISINOPRIL 2.5 MG PO TABS
2.5000 mg | ORAL_TABLET | Freq: Every day | ORAL | Status: DC
  Administered 2013-02-13 – 2013-02-14 (×2): 2.5 mg via ORAL
  Filled 2013-02-12 (×4): qty 1

## 2013-02-12 MED ORDER — SUCRALFATE 1 GM/10ML PO SUSP
1.0000 g | Freq: Three times a day (TID) | ORAL | Status: DC
  Administered 2013-02-12 – 2013-02-14 (×7): 1 g via ORAL
  Filled 2013-02-12 (×11): qty 10

## 2013-02-12 MED ORDER — METRONIDAZOLE 250 MG PO TABS
250.0000 mg | ORAL_TABLET | Freq: Three times a day (TID) | ORAL | Status: DC
  Administered 2013-02-12 – 2013-02-13 (×3): 250 mg via ORAL
  Filled 2013-02-12 (×5): qty 1

## 2013-02-12 MED ORDER — METOPROLOL SUCCINATE ER 25 MG PO TB24
75.0000 mg | ORAL_TABLET | Freq: Every day | ORAL | Status: DC
  Administered 2013-02-13 – 2013-02-14 (×2): 75 mg via ORAL
  Filled 2013-02-12 (×3): qty 1

## 2013-02-12 MED ORDER — METOPROLOL TARTRATE 25 MG PO TABS
25.0000 mg | ORAL_TABLET | Freq: Three times a day (TID) | ORAL | Status: AC
  Administered 2013-02-12 (×2): 25 mg via ORAL
  Filled 2013-02-12 (×2): qty 1

## 2013-02-12 MED ORDER — FUROSEMIDE 20 MG PO TABS
20.0000 mg | ORAL_TABLET | Freq: Two times a day (BID) | ORAL | Status: DC
  Administered 2013-02-12 – 2013-02-14 (×4): 20 mg via ORAL
  Filled 2013-02-12 (×6): qty 1

## 2013-02-12 MED ORDER — FAMOTIDINE 20 MG PO TABS
20.0000 mg | ORAL_TABLET | Freq: Two times a day (BID) | ORAL | Status: DC
  Administered 2013-02-12 – 2013-02-14 (×4): 20 mg via ORAL
  Filled 2013-02-12 (×7): qty 1

## 2013-02-12 NOTE — Progress Notes (Signed)
Physical Therapy Session Note  Patient Details  Name: [name] MRN: [id number] Date of Birth: [birth year]  Today's Date: 02/12/2013 Time: 0830-0930 and 1130-1200 Time Calculation (min): 60 min and 30 min  Short Term Goals: Week 1:  PT Short Term Goal 1 (Week 1): Patient will perform bed mobility minA.  PT Short Term Goal 2 (Week 1): Patient will perform stand pivot transfers minA.  PT Short Term Goal 3 (Week 1): Patient will perform wheelchair mobility 100' supervision level with B LE.  PT Short Term Goal 4 (Week 1): Patient will ambulate 53' minA with LRAD.  PT Short Term Goal 5 (Week 1): Patient will negotiate 3 steps minA.   Skilled Therapeutic Interventions/Progress Updates:    AM session #1: Patient received seated in wheelchair, OT present. Session focused on functional transfers, wheelchair mobility, gait training, stair negotiation, and L LE NMR. See below for details. Vitals taken seated in wheelchair O2 98% and HR 112 following w/c mobility. Patient sit<>stand transfers supervision level. Stand pivot transfer wheelchair<>mat table supervision with [facility]. Patient requested to lie down, sit>supine supervision level, supine>sit minA with manual facilitation for elevation of trunk. Patient instructed in car transfer, supervision level verbal cues for technique (turning with back of knees to seat and not single leg stance method), and not to hold onto door or any grab bars secondary to sternal precautions. Discussion with patient about ability to drive after discharge and recommended patient have his family drive him, patient encouraged to talk to his doctor for more information. Patient returned to room totalA wheelchair mobility. Patient instructed in 5x mini squats to facilitate increased eccentric quad control when sitting. Ended session patient seated in recliner, with all needs within reach.   AM Session #2: Patient received supine in bed, fiance present. Session focused on gait  training and functional transfers. Patient supine>sit minA with HOB flat, required assistance for elevation of trunk. Patient transported totalA for wheelchair mobility to hospital lobby. Patient instructed in gait training 200' [facility] supervision level in community environment. Patient required to navigate different surfaces (carpet, tile, thresholds, concete, declines) and other obstacles/distractions with verbal cues for appropriate pacing. Discussion with patient and fiance regarding patients ability to go outside with his fiance, patient and fiance verbalized understanding that patient must check out/in with nursing and when off the unit patient is to remain seated in his wheelchair and not walk or transfer to another surface. Patient returned to room, seated in wheelchair with all needs within reach and fiance present.  Therapy Documentation Precautions:  Precautions Precautions: Sternal;Fall Precaution Comments: decreased recall of sternal precautions Restrictions Weight Bearing Restrictions: No   Vital Signs: Therapy Vitals BP: 92/56 mmHg Pain: Pain Assessment Pain Assessment: No/denies pain Pain Score: 0-No pain    Locomotion : Ambulation Ambulation: Yes Ambulation/Gait Assistance: 5: Supervision Ambulation Distance (Feet): 75 Feet (x2 and 200'x1) Assistive device: Large base quad cane Ambulation/Gait Assistance Details: Verbal cues for precautions/safety;Verbal cues for technique;Verbal cues for safe use of DME/AE Ambulation/Gait Assistance Details: Patient instucted in gait training 75'x2 [facility] supervision level, verbal cues for proper use of AD (all 4 legs contact with floor) and for appropriate pacing.  Stairs / Additional Locomotion Stairs: Yes Stairs Assistance: 5: Supervision Stairs Assistance Details: Verbal cues for sequencing;Verbal cues for technique;Verbal cues for precautions/safety Stairs Assistance Details (indicate cue type and reason): Patient instructed in stair  negotiation 5steps x1 and 10 steps x1 supervision level with B handrails. Both trials verbal cues for ascending steps with R  LE and descening steps with L LE and placement of hands on handrails for support and not to pull/push with UEs.  Stair Management Technique: Two rails;Step to pattern Number of Stairs: 5 (and 10x1) Height of Stairs: 6 Wheelchair Mobility Wheelchair Mobility: Yes Wheelchair Assistance: 5: Investment banker, operational: Both lower extermities Wheelchair Parts Management: Needs assistance Distance: 175'x1 and 75'x1    See FIM for current functional status  Therapy/Group: Individual Therapy  Doralee Albino 02/12/2013, 12:14 PM

## 2013-02-12 NOTE — Progress Notes (Signed)
Social Work Patient ID: [name], male   DOB: 06-30-1960, 52 y.o.   MRN: [id number] Pt to be discharge Sat 11/1 not tomorrow.  Family education today with ex-wife today.  Will have to re-schedule wellness appointment, to get connected to [facility] and to obtain an orange card to assist with medicines.  Pt is aware and agreeable to both.

## 2013-02-12 NOTE — Progress Notes (Signed)
Social Work Patient ID: [name], male   DOB: 12-22-1960, 52 y.o.   MRN: [id number] Pt requires a lightweight wheelchair due to he is unable to self propel a standard weight wheelchair.  He also uses for his self care tasks. Family member to come in today to go through family education to prepare for discharge tomorrow.

## 2013-02-12 NOTE — Progress Notes (Signed)
Subjective/Complaints: 52 y.o. male with history of CAD and recent NSTEMI 01/05/13 treated medically. He was readmitted on 01/31/13 with recurrent chest pain due to acute pericarditis and cardiac tamponade (Dressler syndrome). He underwent pericardiocentesis 10/11 with improvement in symptoms and drain removed 10/14. On 10/15, he developed LUE sensory loss with left hand weakness and ataxia,left visual deficits. He was noted to be hypotensive, hypoxic with fever and chills. He was intubated and started on broad spectrum antibiotics for sepsis and CT head negative. Repeat echo showed a complex effusion with RV collapse and tamponade. He was taken to OR the same day for CABG X1 with repair of LV ruptured inferoposterior pseudoaneurysm by Dr. Dorris Fetch.  Dr. Luciana Axe and Dr. Pearlean Brownie consulted for input. CTA head/neck with New acute/ subacute nonhemorrhagic infarcts involving the left superior cerebellum, the posterior and medial left parietal lobe and lateral left occipital lobe, the higher left frontal lobe, the posterior right frontal lobe and corona radiata. He was extubated without difficulty. Neurology feels that patient with embolic stroke on unknown source but suspected to be due to sepsis. Antibiotics narrowed to imipenem with recommendations for 14 total days antibiotics-D/C 10/29  Nausea improved today Review of Systems - Negative except Left arm weakness  Objective: Vital Signs: Blood pressure 100/68, pulse 103, temperature 98.5 F (36.9 C), temperature source Oral, resp. rate 16, height 6\' 1"  (1.854 m), weight 93.849 kg (206 lb 14.4 oz), SpO2 95.00%. No results found. Results for orders placed during the hospital encounter of 02/04/13 (from the past 72 hour(s))  CREATININE, SERUM     Status: None   Collection Time    02/11/13  5:32 AM      Result Value Range   Creatinine, Ser 0.78  0.50 - 1.35 mg/dL   GFR calc non Af Amer >90  >90 mL/min   GFR calc Af Amer >90  >90 mL/min   Comment: (NOTE)      The eGFR has been calculated using the CKD EPI equation.     This calculation has not been validated in all clinical situations.     eGFR's persistently <90 mL/min signify possible Chronic Kidney     Disease.  BASIC METABOLIC PANEL     Status: Abnormal   Collection Time    02/11/13  5:32 AM      Result Value Range   Sodium 132 (*) 135 - 145 mEq/L   Potassium 3.5  3.5 - 5.1 mEq/L   Chloride 91 (*) 96 - 112 mEq/L   CO2 23  19 - 32 mEq/L   Glucose, Bld 108 (*) 70 - 99 mg/dL   BUN 11  6 - 23 mg/dL   Creatinine, Ser 4.09  0.50 - 1.35 mg/dL   Calcium 9.3  8.4 - 81.1 mg/dL   GFR calc non Af Amer >90  >90 mL/min   GFR calc Af Amer >90  >90 mL/min   Comment: (NOTE)     The eGFR has been calculated using the CKD EPI equation.     This calculation has not been validated in all clinical situations.     eGFR's persistently <90 mL/min signify possible Chronic Kidney     Disease.     HEENT: normal Cardio: RRR and No murmurs or extra sounds Resp: CTA B/L and Unlabored GI: BS positive and Nontender nondistended Extremity:  Pulses positive and Edema Left dorsal hand Skin:   Wound C/D/I and Sternotomy wound is well healed. Drain sites still in completely closed, small amount of dried  blood Neuro: Alert/Oriented, Flat, Cranial Nerve II-XII normal, Abnormal Sensory Paresthesias left arm, Normal Motor and Tone:  Hypotonia Musc/Skel:  Other No pain with left shoulder range of motion. General no acute distress Motor strength is 2-/5 in the left deltoid, bicep, tricep,1/5 grip, 4+/5 in the left hip flexor, knee extensors, ankle dorsi flexion plantar flexor 5/5 in the right upper and right lower limb  Assessment/Plan: 1. Functional deficits secondary to right posterior frontal and corona radiata infarcts causing left upper extremity weakness, also deconditioned from coronary artery disease status post CABG which require 3+ hours per day of interdisciplinary therapy in a comprehensive inpatient rehab  setting. Physiatrist is providing close team supervision and 24 hour management of active medical problems listed below. Physiatrist and rehab team continue to assess barriers to discharge/monitor patient progress toward functional and medical goals.  FIM: FIM - Bathing Bathing Steps Patient Completed: Chest;Left Arm;Abdomen;Front perineal area (completed UB bathing only) Bathing: 3: Mod-Patient completes 5-7 25f 10 parts or 50-74%  FIM - Upper Body Dressing/Undressing Upper body dressing/undressing steps patient completed: Put head through opening of pull over shirt/dress;Pull shirt over trunk;Thread/unthread right sleeve of pullover shirt/dresss Upper body dressing/undressing: 4: Min-Patient completed 75 plus % of tasks FIM - Lower Body Dressing/Undressing Lower body dressing/undressing steps patient completed: Thread/unthread right underwear leg;Thread/unthread left underwear leg;Thread/unthread right pants leg;Thread/unthread left pants leg Lower body dressing/undressing: 3: Mod-Patient completed 50-74% of tasks  FIM - Toileting Toileting steps completed by patient: Adjust clothing after toileting;Performs perineal hygiene Toileting: 3: Mod-Patient completed 2 of 3 steps  FIM - Diplomatic Services operational officer Devices: Occupational hygienist Transfers: 5-To toilet/BSC: Supervision (verbal cues/safety issues);5-From toilet/BSC: Supervision (verbal cues/safety issues)  FIM - Press photographer Assistive Devices: HOB elevated Bed/Chair Transfer: 5: Sit > Supine: Supervision (verbal cues/safety issues);5: Supine > Sit: Supervision (verbal cues/safety issues);5: Chair or W/C > Bed: Supervision (verbal cues/safety issues);5: Bed > Chair or W/C: Supervision (verbal cues/safety issues)  FIM - Locomotion: Wheelchair Distance: 125'x1 Locomotion: Wheelchair: 2: Travels 50 - 149 ft with supervision, cueing or coaxing FIM - Locomotion: Ambulation Locomotion: Ambulation  Assistive Devices: Occupational hygienist [facility]) Ambulation/Gait Assistance: 5: Supervision Locomotion: Ambulation: 2: Travels 50 - 149 ft with supervision/safety issues  Comprehension Comprehension Mode: Auditory Comprehension: 5-Follows basic conversation/direction: With no assist  Expression Expression Mode: Verbal Expression: 5-Expresses complex 90% of the time/cues < 10% of the time  Social Interaction Social Interaction: 5-Interacts appropriately 90% of the time - Needs monitoring or encouragement for participation or interaction.  Problem Solving Problem Solving Mode: Asleep Problem Solving: 5-Solves basic 90% of the time/requires cueing < 10% of the time  Memory Memory Mode: Asleep Memory: 5-Recognizes or recalls 90% of the time/requires cueing < 10% of the time  Medical Problem List and Plan:  1. DVT Prophylaxis/Anticoagulation: Pharmaceutical: Lovenox  2. Pain Management: prn medications effective.  3. Mood: Provide ego support--has supportive girlfriend. Will have LCSW follow for evaluation.  4. Neuropsych: This patient is capable of making decisions on his own behalf.  5. Pericarditis: off imipenem monitor temp off abx x 48hours 6. Antibiotic associated diarrhea:should improve off primaxin 7. ABLA: Add iron supplement  8. CAD/s/p CABG X 1: sternal precautions.Appreciate CVTS note Continue ASA/plavix, lisinopril, zocor and metoprolol    LOS (Days) 8 A FACE TO FACE EVALUATION WAS PERFORMED  Kaleya Douse E 02/12/2013, 8:45 AM

## 2013-02-12 NOTE — Progress Notes (Signed)
Occupational Therapy Session Note  Patient Details  Name: [name] MRN: [id number] Date of Birth: 03-13-1961  Today's Date: 02/12/2013 Time: 0730-0830 Time Calculation (min): 60 min  Short Term Goals: Week 1:  OT Short Term Goal 1 (Week 1): Pt will complete toilet transfer with min assist OT Short Term Goal 2 (Week 1): Pt will complete shower transfer with min assi OT Short Term Goal 3 (Week 1): Pt will complete bathing with mod assist  OT Short Term Goal 4 (Week 1): Pt will complete LB dressing with mod assist  Skilled Therapeutic Interventions/Progress Updates:    Therapy session focused on dynamic standing balance, activity tolerance, hemi dressing, functional transfers and carryover of sternal precautions during ADL retraining. Pt received supine in bed. Completed supine>sit with bed flat and min assist. Pt verbalized 4/4 sternal precautions. Ambulated bed>toilet with [facility] and supervision. Required assist for hygiene (sternal precautions preventing from reaching behind). Completed bathing with HOH assist to wash RUE and buttock hygiene secondary to sternal precautions. Pt initiated use of LUE as stabilizer to hold LH sponge as he applied soap. Completed dressing from w/c level with min verbal cues for hemi dressing technique. Required mod assist for LB dressing for shoes and and demonstrated good carryover with use of reacher. Pt required frequent rest breaks throughout therapy session. PT entered at end of therapy session.   Therapy Documentation Precautions:  Precautions Precautions: Sternal;Fall Precaution Comments: decreased recall of sternal precautions Restrictions Weight Bearing Restrictions: No General:   Vital Signs:   Pain: No c/o pain during therapy session  See FIM for current functional status  Therapy/Group: Individual Therapy  Daneil Dan 02/12/2013, 8:57 AM

## 2013-02-12 NOTE — Progress Notes (Signed)
Speech Language Pathology Daily Session Note  Patient Details  Name: [name] MRN: [id number] Date of Birth: [birth year]  Today's Date: 02/12/2013 Time: 1000-1045 Time Calculation (min): 45 min  Short Term Goals: Week 1: SLP Short Term Goal 1 (Week 1): Patient will solve functional complex problems with Min verbal cues. SLP Short Term Goal 1 - Progress (Week 1): Progressing toward goal SLP Short Term Goal 2 (Week 1): Patient will utilize external memory aids with Supervision cues. SLP Short Term Goal 2 - Progress (Week 1): Progressing toward goal SLP Short Term Goal 3 (Week 1): Patient will demonstrate alternating attention during a structured task for 10 minutes with Min cues. SLP Short Term Goal 3 - Progress (Week 1): Progressing toward goal SLP Short Term Goal 4 (Week 1): Patient will self-monitor himself during a structured task with Min cues.  Skilled Therapeutic Interventions: Treatment focused on cognitive goals. SLP facilitated session with generated list of current medications. Pt participated in pill box activity with Min cues for complex problem solving, utilizing list as an external aid. Pt did not need redirective cues to select attention to task, and verbalized his needs with Mod I. Continue plan of care.   FIM:  Comprehension Comprehension Mode: Auditory Comprehension: 5-Understands complex 90% of the time/Cues < 10% of the time Expression Expression Mode: Verbal Expression: 5-Expresses basic needs/ideas: With no assist Social Interaction Social Interaction: 5-Interacts appropriately 90% of the time - Needs monitoring or encouragement for participation or interaction. Problem Solving Problem Solving: 5-Solves complex 90% of the time/cues < 10% of the time Memory Memory: 5-Recognizes or recalls 90% of the time/requires cueing < 10% of the time  Pain Pain Assessment Pain Assessment: No/denies pain  Therapy/Group: Individual Therapy  Maxcine Ham 02/12/2013, 12:09 PM  Maxcine Ham, M.A. CCC-SLP [phone]

## 2013-02-12 NOTE — Progress Notes (Signed)
I have reviewed and agree with the treatment as reflected in this note. [name], PT DPT  

## 2013-02-13 ENCOUNTER — Inpatient Hospital Stay (HOSPITAL_COMMUNITY): Payer: Medicaid Other | Admitting: *Deleted

## 2013-02-13 ENCOUNTER — Encounter (HOSPITAL_COMMUNITY): Payer: Self-pay

## 2013-02-13 ENCOUNTER — Inpatient Hospital Stay (HOSPITAL_COMMUNITY): Payer: Medicaid Other

## 2013-02-13 DIAGNOSIS — I2510 Atherosclerotic heart disease of native coronary artery without angina pectoris: Secondary | ICD-10-CM

## 2013-02-13 DIAGNOSIS — I6330 Cerebral infarction due to thrombosis of unspecified cerebral artery: Secondary | ICD-10-CM

## 2013-02-13 DIAGNOSIS — G8110 Spastic hemiplegia affecting unspecified side: Secondary | ICD-10-CM

## 2013-02-13 LAB — CBC
Hemoglobin: 10.5 g/dL — ABNORMAL LOW (ref 13.0–17.0)
Platelets: 468 10*3/uL — ABNORMAL HIGH (ref 150–400)
RBC: 3.63 MIL/uL — ABNORMAL LOW (ref 4.22–5.81)
RDW: 14.2 % (ref 11.5–15.5)

## 2013-02-13 LAB — BASIC METABOLIC PANEL
GFR calc Af Amer: >90 mL/min (ref >90)
GFR calc non Af Amer: >90 mL/min (ref >90)
Glucose, Bld: 106 mg/dL — ABNORMAL HIGH (ref 70–99)
Potassium: 3.3 mEq/L — ABNORMAL LOW (ref 3.5–5.1)
Sodium: 129 mEq/L — ABNORMAL LOW (ref 135–145)

## 2013-02-13 LAB — CLOSTRIDIUM DIFFICILE BY PCR: Toxigenic C. Difficile by PCR: POSITIVE — AB

## 2013-02-13 MED ORDER — LISINOPRIL 5 MG PO TABS: 2.5000 mg | ORAL_TABLET | Freq: Every day | ORAL | Status: DC

## 2013-02-13 MED ORDER — POTASSIUM CHLORIDE CRYS ER 20 MEQ PO TBCR
40.0000 meq | EXTENDED_RELEASE_TABLET | Freq: Two times a day (BID) | ORAL | Status: DC
  Administered 2013-02-13: 40 meq via ORAL
  Filled 2013-02-13 (×2): qty 2

## 2013-02-13 MED ORDER — FUROSEMIDE 20 MG PO TABS: 20.0000 mg | ORAL_TABLET | Freq: Two times a day (BID) | ORAL | Status: DC

## 2013-02-13 MED ORDER — POTASSIUM CHLORIDE CRYS ER 20 MEQ PO TBCR
20.0000 meq | EXTENDED_RELEASE_TABLET | Freq: Two times a day (BID) | ORAL | Status: DC
  Filled 2013-02-13: qty 1

## 2013-02-13 MED ORDER — SACCHAROMYCES BOULARDII 250 MG PO CAPS: 500.0000 mg | ORAL_CAPSULE | Freq: Two times a day (BID) | ORAL | Status: DC

## 2013-02-13 MED ORDER — METRONIDAZOLE 500 MG PO TABS: 500.0000 mg | ORAL_TABLET | Freq: Three times a day (TID) | ORAL | Status: DC

## 2013-02-13 MED ORDER — POTASSIUM CHLORIDE CRYS ER 20 MEQ PO TBCR
20.0000 meq | EXTENDED_RELEASE_TABLET | Freq: Three times a day (TID) | ORAL | Status: DC
  Administered 2013-02-13 – 2013-02-14 (×2): 20 meq via ORAL
  Filled 2013-02-13 (×6): qty 1

## 2013-02-13 MED ORDER — POTASSIUM CHLORIDE CRYS ER 20 MEQ PO TBCR: 20.0000 meq | EXTENDED_RELEASE_TABLET | Freq: Three times a day (TID) | ORAL | Status: DC

## 2013-02-13 MED ORDER — FAMOTIDINE 20 MG PO TABS: 20.0000 mg | ORAL_TABLET | Freq: Two times a day (BID) | ORAL | Status: DC

## 2013-02-13 MED ORDER — METOPROLOL SUCCINATE ER 25 MG PO TB24: 75.0000 mg | ORAL_TABLET | Freq: Every day | ORAL | Status: DC

## 2013-02-13 MED ORDER — METRONIDAZOLE 500 MG PO TABS
500.0000 mg | ORAL_TABLET | Freq: Three times a day (TID) | ORAL | Status: DC
  Administered 2013-02-13 – 2013-02-14 (×3): 500 mg via ORAL
  Filled 2013-02-13 (×6): qty 1

## 2013-02-13 NOTE — Progress Notes (Addendum)
Occupational Therapy Session Note and Discharge Summary  Patient Details  Name: [name] MRN: [id number] Date of Birth: 29-Sep-1960  Today's Date: 02/13/2013 Time: 1100-1155 Time Calculation (min): 55 min  Skilled Intervention: Re-assessed performance of ADL to include bed mobility, functional transfers, adherence to sternal precautions, bathing, dressing, self-feeding, grooming, functional mobility using quad-tipped cane, toileting, and use of L-UE as gross assist.   Patient requested dry bath during this session due to fatigue with demonstration of transfers and his ability to use LH sponge while maintaining sternal precautions seated on tub bench.   Patient demonstrates inconsistent adherence to precaution to not reach behind his back but affirms assistance will be available "all the time." as he returns home.   Patient able to complete dressing tasks sitting at edge of bed although requiring 3 rest breaks in 10 from due to fatigue from static/dynamic standing to pull up pants and change diaper.   Patient requested bed rest after 38 minutes total time sitting and standing; HR/BP checked and RN notified of low values: 89/63, w/HR 104 bpm.                                                                                                                                                Discharge Summary  Patient has met 10 of 10 long term goals due to improved activity tolerance, improved balance and ability to compensate for deficits.  Patient to discharge at overall Mod Assist level.  Patient's care partner is independent to provide the necessary physical and cognitive assistance at discharge to maintain safety during transfers and assistance with dressing, toileting, home management, meal prep and adherence to sternal precautions, per patient report.    Recommendation:  Patient will benefit from ongoing skilled OT services in home health setting to continue to advance functional skills in the  area of BADL.  Equipment: tub bench, bedside commode, w/c, quad-tipped cane, LH sponge  Reasons for discharge: treatment goals met  Patient/family agrees with progress made and goals achieved: Yes  OT Discharge Precautions/Restrictions  Precautions Precautions: Fall;Sternal Restrictions Weight Bearing Restrictions: No  Vital Signs Therapy Vitals Pulse Rate: 105 Resp: 16 BP: 100/71 mmHg Patient Position, if appropriate: Lying Oxygen Therapy SpO2: 94 % O2 Device: None (Room air)  Pain Pain Assessment Pain Assessment: No/denies pain Pain Score: 0-No pain  ADL ADL Equipment Provided: Long-handled sponge Eating: Supervision/safety Where Assessed-Eating: Bed level Grooming: Modified independent Where Assessed-Grooming: Wheelchair Upper Body Bathing: Supervision/safety Where Assessed-Upper Body Bathing: Shower Lower Body Bathing: Modified independent (lateral leans for buttocks, LH sponge for feet) Where Assessed-Lower Body Bathing: Shower Upper Body Dressing: Supervision/safety Where Assessed-Upper Body Dressing: Edge of bed Lower Body Dressing: Moderate assistance Where Assessed-Lower Body Dressing: Edge of bed Toileting: Moderate assistance Where Assessed-Toileting: Teacher, adult education: Distant supervision Statistician Method: Risk manager: Distant supervision Psychologist, counselling  Transfer Method: Stand pivot Astronomer: Emergency planning/management officer  Vision/Perception  Vision - History Baseline Vision: Wears glasses only for reading Patient Visual Report: No change from baseline Vision - Assessment Eye Alignment: Within Functional Limits Perception Perception: Within Functional Limits Praxis Praxis: Intact   Cognition Overall Cognitive Status: Within Functional Limits for tasks assessed Arousal/Alertness: Awake/alert Orientation Level: Oriented X4 Attention: Alternating Focused Attention: Appears intact Sustained Attention:  Appears intact Selective Attention: Appears intact Alternating Attention: Appears intact Memory: Impaired Memory Impairment: Decreased recall of new information Awareness: Appears intact Problem Solving: Impaired Problem Solving Impairment: Functional complex Executive Function: Self Monitoring;Self Correcting Organizing: Appears intact Self Monitoring: Impaired Self Monitoring Impairment: Functional complex Self Correcting: Impaired Self Correcting Impairment: Functional complex Safety/Judgment: Appears intact  Sensation Sensation Light Touch: Appears Intact Stereognosis: Appears Intact Hot/Cold: Appears Intact Proprioception: Appears Intact Coordination Gross Motor Movements are Fluid and Coordinated: No Fine Motor Movements are Fluid and Coordinated: No Coordination and Movement Description: impaired due to L-UE hemiparesis Finger Nose Finger Test: unable to complete with LUE secondary to hemiparesis Heel Shin Test: no noted dysmetria B LE.   Motor  Motor Motor: Hemiplegia Motor - Discharge Observations: Mild L hemiparesis, L UE>L LE  Mobility  Bed Mobility Bed Mobility: Supine to Sit;Sit to Supine Rolling Left: 5: Supervision Supine to Sit: 5: Supervision;HOB flat Supine to Sit Details: Verbal cues for technique;Verbal cues for precautions/safety Supine to Sit Details (indicate cue type and reason): Supine>sit HOB flat, no bedrails, patient supine>L sidelying>sit EOB supervision level, with verbal cues for use of L elbow to assist acheiving upright trunk position.  Sitting - Scoot to Edge of Bed: 5: Supervision Sit to Supine: 5: Supervision Sit to Supine - Details: Verbal cues for technique;Verbal cues for precautions/safety Sit to Supine - Details (indicate cue type and reason): Patient sit>supine HOB flat, no bedrails, supervision level. verbal cues for positiioning of trunk in bed.  Transfers Transfers: Sit to Stand;Stand to Sit Sit to Stand: 5: Supervision Sit  to Stand Details: Verbal cues for technique;Verbal cues for precautions/safety Sit to Stand Details (indicate cue type and reason): Patient sit>stand from bed and wheelchair supervision level, verbal cues for hand placement in lap.  Stand to Sit: 5: Supervision Stand to Sit Details (indicate cue type and reason): Verbal cues for technique;Verbal cues for precautions/safety Stand to Sit Details: Stand>sit supervision level from wheelchair/bed verbal cues for hand placement in lap.    Trunk/Postural Assessment  Cervical Assessment Cervical Assessment: Within Functional Limits Thoracic Assessment Thoracic Assessment: Within Functional Limits Lumbar Assessment Lumbar Assessment: Within Functional Limits Postural Control Postural Control: Deficits on evaluation Postural Limitations: forward head posture   Balance Balance Balance Assessed: Yes Static Sitting Balance Static Sitting - Balance Support: Feet supported Static Sitting - Level of Assistance: 6: Modified independent (Device/Increase time) Static Sitting - Comment/# of Minutes: 3 Dynamic Sitting Balance Dynamic Sitting - Balance Support: Feet supported Dynamic Sitting - Level of Assistance: 6: Modified independent (Device/Increase time) Static Standing Balance Static Standing - Balance Support: Right upper extremity supported Static Standing - Level of Assistance: 5: Stand by assistance Static Standing - Comment/# of Minutes: 1 Dynamic Standing Balance Dynamic Standing - Balance Support: Right upper extremity supported;During functional activity Dynamic Standing - Level of Assistance: 5: Stand by assistance  Extremity/Trunk Assessment RUE Assessment RUE Assessment: Within Functional Limits LUE Assessment LUE Assessment: Exceptions to [facility] LUE AROM (degrees) Overall AROM Left Upper Extremity: Deficits  See FIM for current functional status  Mateo Overbeck 02/13/2013,  12:39 PM

## 2013-02-13 NOTE — Progress Notes (Signed)
Speech Language Pathology Daily Session Note  Patient Details  Name: [name] MRN: [id number] Date of Birth: 13-Nov-1960  Today's Date: 02/13/2013 Time: 1914-7829 Time Calculation (min): 45 min  Short Term Goals: Week 1: SLP Short Term Goal 1 (Week 1): Patient will solve functional complex problems with Min verbal cues. SLP Short Term Goal 1 - Progress (Week 1): Progressing toward goal SLP Short Term Goal 2 (Week 1): Patient will utilize external memory aids with Supervision cues. SLP Short Term Goal 2 - Progress (Week 1): Progressing toward goal SLP Short Term Goal 3 (Week 1): Patient will demonstrate alternating attention during a structured task for 10 minutes with Min cues. SLP Short Term Goal 3 - Progress (Week 1): Progressing toward goal SLP Short Term Goal 4 (Week 1): Patient will self-monitor himself during a structured task with Min cues.  Skilled Therapeutic Interventions: Treatment focused on cognitive goals and education. SLP facilitated session with review of current level of functioning as well as compensatory strategies. No family/caregivers present, however handout provided as well as pill box. Pt verbalized his need for the bathroom and recalled all of his sternal precautions prior to transfer with Mod I. He followed basic commands for transfer and demonstrated basic problem solving with Mod I. At the end of the session pt briefly felt lightheaded - he was returned to bed and RN was made aware.    FIM:  Comprehension Comprehension Mode: Auditory Comprehension: 5-Follows basic conversation/direction: With no assist Expression Expression Mode: Verbal Expression: 5-Expresses basic 90% of the time/requires cueing < 10% of the time. Social Interaction Social Interaction: 6-Interacts appropriately with others with medication or extra time (anti-anxiety, antidepressant). Problem Solving Problem Solving: 5-Solves basic 90% of the time/requires cueing < 10% of the  time Memory Memory: 5-Recognizes or recalls 90% of the time/requires cueing < 10% of the time FIM - Eating Eating Activity: 5: Set-up assist for apply device (including dentures);5: Set-up assist for cut food;5: Set-up assist for open containers  Pain Pain Assessment Pain Assessment: No/denies pain Pain Score: 0-No pain  Therapy/Group: Individual Therapy  Maxcine Ham 02/13/2013, 11:51 AM  Maxcine Ham, M.A. CCC-SLP [phone]

## 2013-02-13 NOTE — Progress Notes (Signed)
I have reviewed and agree with the treatment as reflected in this note. Demitrus Francisco, PT DPT  

## 2013-02-13 NOTE — Progress Notes (Signed)
Feels better this am.  No more N/V. Esophagram negative and patient reports he was able to keep supper down. Has more frequent diarrhea  with foul smell reported by patient, girlfriend and staff. He has been on prolonged antibiotics. Will recheck C diff--even if negative will treat empirically with flagyl  X 10 days for antibiotic associated diarrhea.

## 2013-02-13 NOTE — Discharge Summary (Signed)
Physician Discharge Summary  Patient ID: [name] MRN: [id number] DOB/AGE: [birth year] 52 y.o.  Admit date: 02/04/2013 Discharge date: 02/13/2013  Discharge Diagnoses:  Principal Problem:   CVA (cerebral infarction) Active Problems:   Acute pericarditis, unspecified   Leucocytosis   Acute blood loss anemia   Diarrhea--due to c diff colitis   Discharged Condition: Stable  Significant Diagnostic Studies: Dg Esophagus  02/12/2013   CLINICAL DATA:  Dysphagia. Recent strokes.  EXAM: ESOPHOGRAM/BARIUM SWALLOW  TECHNIQUE: Single contrast examination was performed using thin and thick barium as well as 13 mm barium tablet  COMPARISON:  None.  FLUOROSCOPY TIME:  1 min 23 seconds  FINDINGS: The oropharyngeal swallowing mechanisms are normal. There is no aspiration or laryngeal penetration.  The mucosa and motility of the esophagus are normal. There is no visible hiatal hernia or gastroesophageal reflux. A 13 mm barium tablet passed from the mouth to the stomach with no significant delay.  IMPRESSION: Normal barium esophagram.   Electronically Signed   By: Geanie Cooley M.D.   On: 02/12/2013 16:21   Dg Chest Port 1 View  02/04/2013   CLINICAL DATA:  Post CABG, repair of left ventricular pseudoaneurysm  EXAM: PORTABLE CHEST - 1 VIEW  COMPARISON:  Portable chest x-ray of 02/03/2013  FINDINGS: Moderate cardiomegaly is stable with perhaps minimal pulmonary vascular congestion and basilar volume loss. Small effusions cannot be excluded. A right IJ central venous line is unchanged in position overlying the lower SVC. Median sternotomy sutures are noted.  IMPRESSION: Stable cardiomegaly. Minimal basilar atelectasis with questionable mild pulmonary vascular congestion.   Electronically Signed   By: Dwyane Dee M.D.   On: 02/04/2013 09:14     Labs:  Basic Metabolic Panel:  Recent Labs Lab 02/11/13 0532 02/13/13 1100  NA 132* 129*  K 3.5 3.3*  CL 91* 89*  CO2 23 23  GLUCOSE 108* 106*  BUN  11 11  CREATININE 0.78  0.78 0.83  CALCIUM 9.3 9.2    CBC:    Component Value Date/Time   WBC 6.5 02/13/2013 1100   RBC 3.63* 02/13/2013 1100   HGB 10.5* 02/13/2013 1100   HCT 30.8* 02/13/2013 1100   PLT 468* 02/13/2013 1100   MCV 84.8 02/13/2013 1100   MCH 28.9 02/13/2013 1100   MCHC 34.1 02/13/2013 1100   RDW 14.2 02/13/2013 1100   LYMPHSABS 2.5 02/05/2013 0500   MONOABS 1.1* 02/05/2013 0500   EOSABS 0.0 02/05/2013 0500   BASOSABS 0.0 02/05/2013 0500      CBG: No results found for this basename: GLUCAP,  in the last 168 hours  Brief HPI:   [name] is a 52 y.o. male with history of CAD and recent NSTEMI 01/05/13 treated medically. He was readmitted on 01/31/13 with recurrent chest pain due to acute pericarditis and cardiac tamponade (Dressler syndrome). He underwent pericardiocentesis 10/11 with improvement in symptoms and drain removed 10/14. On 10/15, he developed LUE sensory loss with left hand weakness and ataxia,left visual deficits. He was noted to be hypotensive, hypoxic with fever and chills. He was intubated and started on broad spectrum antibiotics for sepsis and CT head negative. Repeat echo showed a complex effusion with RV collapse and tamponade. He was taken to OR the same day for CABG X1 with repair of LV ruptured inferoposterior pseudoaneurysm by Dr. Dorris Fetch. CTA head/neck showed New acute/ subacute nonhemorrhagic infarcts involving the left superior cerebellum, the posterior and medial left parietal lobe and lateral left occipital lobe, the higher  left frontal lobe, the posterior right frontal lobe and corona radiata that neurology felt was embolic due to sepsis. Dr. Luciana Axe was consulted for input and recommends 14 day course of primaxin.  Patient continued with left sided weakness and ataxia. Therapies initiated and CIR was recommended by team.     Hospital Course: [name] was admitted to rehab 02/04/2013 for inpatient therapies to consist of  PT, ST and OT at least three hours five days a week. Past admission physiatrist, therapy team and rehab RN have worked together to provide customized collaborative inpatient rehab. He completed his antibiotic course on 02/11/13. He was placed on fiber as well as probiotics to help with ongoing diarrhea. He did have worsening of symptoms on 02/12/13 and recheck stool was positive for C-diff. He was started on flagyl and is to complete 14 day treatment. He was noted to have hypokalemia due to diarrhea therefore supplement was increased X 24 hours and he was discharged on tid supplement past discharge.   Blood pressures were monitored on bid basis and as blood pressures were running low, lisinopril was decreased to 2.5 mg daily. His weights have been monitored daily and is down to  89.2 kg and no signs of overload noted. Lasix was decreased to 20 mg bid. Anticipate hyponatremia and hypokalemia will further improve with decrease in diuretic doses. HHRN to recheck lytes next week. He was set up with match program to assist with medications and is in process of being set up with [employer] for medical follow up past discharge.   Rehab course: During patient's stay in rehab weekly team conferences were held to monitor patient's progress, set goals and discuss barriers to discharge. Patient has had improvement in activity tolerance, balance, postural control, as well as ability to compensate for deficits. He is has had improvement in functional use LUE  and LLE as well as improved awareness. He requires supervision for all transfers and mobility. He is able to ambulate 150" with [facility] with cues for pacing and proper use of AD. He requires mod assist with multiple rest breaks to complete ADL tasks.  Speech therapy has addressed cognitive-linguistic goals. He  has demonstrated functional gains with complex problem solving, alternating attention, and recall of information. He continues to require Min cues for  complex problem solving and medication management. Family education was done with girlfriend who will provide assistance as needed past discharge.  He will continue to receive Home Health therapies by [facility] past discharge.     Disposition: Home.  Diet: Heart Healthy. Low salt.  Special Instructions: 1. Continue sternal precautions.  No driving.         Future Appointments Provider Department Dept Phone   03/03/2013 11:00 AM Loreli Slot, MD [facility] [phone]   03/20/2013 12:45 PM Erick Colace, MD [facility] [phone]       Medication List    STOP taking these medications       feeding supplement (ENSURE COMPLETE) Liqd     guaiFENesin-dextromethorphan 100-10 MG/5ML syrup  Commonly known as:  ROBITUSSIN DM     loperamide 1 MG/5ML solution  Commonly known as:  IMODIUM     metoprolol tartrate 25 MG tablet  Commonly known as:  LOPRESSOR     ondansetron 4 MG/2ML Soln injection  Commonly known as:  ZOFRAN     oxyCODONE 5 MG immediate release tablet  Commonly known as:  Oxy IR/ROXICODONE  pantoprazole 40 MG tablet  Commonly known as:  PROTONIX     sodium chloride 0.9 % SOLN 100 mL with imipenem-cilastatin 500 MG SOLR 500 mg      TAKE these medications       aspirin 325 MG EC tablet  Take 1 tablet (325 mg total) by mouth daily.     clopidogrel 75 MG tablet  Commonly known as:  PLAVIX  Take 1 tablet (75 mg total) by mouth daily.     famotidine 20 MG tablet  Commonly known as:  PEPCID  Take 1 tablet (20 mg total) by mouth 2 (two) times daily.     furosemide 20 MG tablet  Commonly known as:  LASIX  Take 1 tablet (20 mg total) by mouth 2 (two) times daily.     lisinopril 5 MG tablet  Commonly known as:  PRINIVIL,ZESTRIL  Take 0.5 tablets (2.5 mg total) by mouth daily.     metoprolol succinate 25 MG 24 hr tablet  Commonly known as:  TOPROL-XL  Take 3 tablets (75  mg total) by mouth daily.     metroNIDAZOLE 500 MG tablet  Commonly known as:  FLAGYL  Take 1 tablet (500 mg total) by mouth every 8 (eight) hours.     potassium chloride SA 20 MEQ tablet  Commonly known as:  K-DUR,KLOR-CON  Take 1 tablet (20 mEq total) by mouth 3 (three) times daily.     saccharomyces boulardii 250 MG capsule  Commonly known as:  FLORASTOR  Take 2 capsules (500 mg total) by mouth 2 (two) times daily.     simvastatin 40 MG tablet  Commonly known as:  ZOCOR  Take 1 tablet (40 mg total) by mouth at bedtime.       Follow-up Information   Follow up with Erick Colace, MD On 03/20/2013. (Be there at 12:15pm   for 12:45pm  appointment)    Specialty:  Physical Medicine and Rehabilitation   Contact information:   [address] [place] [address] [phone]       Follow up with Loreli Slot, MD On 03/03/2013. (Be there for appointment at 11 am. )    Specialty:  Cardiothoracic Surgery   Contact information:   [address] [place] [address] [phone]       Follow up with Gates Rigg, MD. Call today. (for follow up in 4 weeks)    Specialties:  Neurology, Radiology   Contact information:   [address] [place] [address] [phone]       Follow up with Peter [place], MD. Call today. (for follow up appointment.)    Specialty:  Cardiology   Contact information:   [address] [place] [address] [phone]       Follow up with [facility] On 03/03/2013. (CXR at 10 am followed by appointment with Dr. Dorris Fetch.  ([address]--1st floor))    Contact information:   [place]       Signed: Jacquelynn Cree 02/13/2013, 5:36 PM

## 2013-02-13 NOTE — Progress Notes (Signed)
Social Work Discharge Note Discharge Note  The overall goal for the admission was met for:   Discharge location: Yes-HOME WITH STEP-DAUGHTER AND GIRLFRIEND ASSISTING WITH CARE  Length of Stay: Yes-9 DAYS  Discharge activity level: Yes-SUPERVISION/MIN LEVEL  Home/community participation: Yes  Services provided included: MD, RD, PT, OT, SLP, RN, Pharmacy, Neuropsych and SW  Financial Services: Other: PENDING MEDICAID  Follow-up services arranged: Home Health: [facility]-PT,OT,RN,SPT,SW, DME: [facility], [facility], [facility], [facility] and Patient/Family has no preference for HH/DME agencies  Comments (or additional information):FAMILY EDUCATION COMPLETED AND FEEL COMFORTABLE WITH D/C. MATCH PROGRAM GIVEN TO PT FOR ASSISTANCE WITH [facility] AND [name] TO FOLLOW UP WITH.  ATTEMPTING TO GET APPT AT [facility] FOR MEDS AND MD FOLLOW UP, GIVEN PT THE NUMBER TO FOLLOW UP WITH  Patient/Family verbalized understanding of follow-up arrangements: Yes  Individual responsible for coordination of the follow-up plan: SELF & [name]  Confirmed correct DME delivered: [name] 02/13/2013    [name]

## 2013-02-13 NOTE — Progress Notes (Signed)
Speech Language Pathology Discharge Summary  Patient Details  Name: [name] MRN: [id number] Date of Birth: Mar 16, 1961  Today's Date: 02/13/2013  Patient has met 3 of 3 long term goals.  Patient to discharge at [facility] level.  Reasons goals not met: N/A   Clinical Impression/Discharge Summary: Pt has met 3/3 LTGs in speech therapy during this admission. He has demonstrated functional gains with complex problem solving, alternating attention, and recall of information. He continues to require Min cues for complex problem solving and medication management. Pt education is complete and he has met all of his treatment goals. He is scheduled to discharge home tomorrow with 24/7 assistance. Pt will benefit from continued SLP services in order to maximize functional independence.  Care Partner:  Caregiver Able to Provide Assistance: Yes  Type of Caregiver Assistance: Cognitive  Recommendation:  24 hour supervision/assistance;Home Health SLP  Rationale for SLP Follow Up: Maximize cognitive function and independence   Equipment: None recommended by SLP   Reasons for discharge: Treatment goals met;Discharged from hospital   Patient/Family Agrees with Progress Made and Goals Achieved: Yes   See FIM for current functional status  Maxcine Ham 02/13/2013, 12:00 PM  Maxcine Ham, M.A. CCC-SLP [phone]

## 2013-02-13 NOTE — Progress Notes (Addendum)
Subjective/Complaints: 52 y.o. male with history of CAD and recent NSTEMI 01/05/13 treated medically. He was readmitted on 01/31/13 with recurrent chest pain due to acute pericarditis and cardiac tamponade (Dressler syndrome). He underwent pericardiocentesis 10/11 with improvement in symptoms and drain removed 10/14. On 10/15, he developed LUE sensory loss with left hand weakness and ataxia,left visual deficits. He was noted to be hypotensive, hypoxic with fever and chills. He was intubated and started on broad spectrum antibiotics for sepsis and CT head negative. Repeat echo showed a complex effusion with RV collapse and tamponade. He was taken to OR the same day for CABG X1 with repair of LV ruptured inferoposterior pseudoaneurysm by Dr. Dorris Fetch.  Dr. Luciana Axe and Dr. Pearlean Brownie consulted for input. CTA head/neck with New acute/ subacute nonhemorrhagic infarcts involving the left superior cerebellum, the posterior and medial left parietal lobe and lateral left occipital lobe, the higher left frontal lobe, the posterior right frontal lobe and corona radiata. He was extubated without difficulty. Neurology feels that patient with embolic stroke on unknown source but suspected to be due to sepsis. Antibiotics narrowed to imipenem with recommendations for 14 total days antibiotics-D/C 10/29  Feeling well. Slept well. Encouraged by progress. Review of Systems - Negative except Left arm weakness  Objective: Vital Signs: Blood pressure 116/64, pulse 108, temperature 99.3 F (37.4 C), temperature source Axillary, resp. rate 16, height 6\' 1"  (1.854 m), weight 89.2 kg (196 lb 10.4 oz), SpO2 95.00%. Dg Esophagus  02/12/2013   CLINICAL DATA:  Dysphagia. Recent strokes.  EXAM: ESOPHOGRAM/BARIUM SWALLOW  TECHNIQUE: Single contrast examination was performed using thin and thick barium as well as 13 mm barium tablet  COMPARISON:  None.  FLUOROSCOPY TIME:  1 min 23 seconds  FINDINGS: The oropharyngeal swallowing mechanisms  are normal. There is no aspiration or laryngeal penetration.  The mucosa and motility of the esophagus are normal. There is no visible hiatal hernia or gastroesophageal reflux. A 13 mm barium tablet passed from the mouth to the stomach with no significant delay.  IMPRESSION: Normal barium esophagram.   Electronically Signed   By: Geanie Cooley M.D.   On: 02/12/2013 16:21   Results for orders placed during the hospital encounter of 02/04/13 (from the past 72 hour(s))  CREATININE, SERUM     Status: None   Collection Time    02/11/13  5:32 AM      Result Value Range   Creatinine, Ser 0.78  0.50 - 1.35 mg/dL   GFR calc non Af Amer >90  >90 mL/min   GFR calc Af Amer >90  >90 mL/min   Comment: (NOTE)     The eGFR has been calculated using the CKD EPI equation.     This calculation has not been validated in all clinical situations.     eGFR's persistently <90 mL/min signify possible Chronic Kidney     Disease.  BASIC METABOLIC PANEL     Status: Abnormal   Collection Time    02/11/13  5:32 AM      Result Value Range   Sodium 132 (*) 135 - 145 mEq/L   Potassium 3.5  3.5 - 5.1 mEq/L   Chloride 91 (*) 96 - 112 mEq/L   CO2 23  19 - 32 mEq/L   Glucose, Bld 108 (*) 70 - 99 mg/dL   BUN 11  6 - 23 mg/dL   Creatinine, Ser 0.86  0.50 - 1.35 mg/dL   Calcium 9.3  8.4 - 57.8 mg/dL   GFR calc  non Af Amer >90  >90 mL/min   GFR calc Af Amer >90  >90 mL/min   Comment: (NOTE)     The eGFR has been calculated using the CKD EPI equation.     This calculation has not been validated in all clinical situations.     eGFR's persistently <90 mL/min signify possible Chronic Kidney     Disease.     HEENT: normal Cardio: RRR and No murmurs or extra sounds Resp: CTA B/L and Unlabored GI: BS positive and Nontender nondistended Extremity:  Pulses positive and Edema Left dorsal hand Skin:   Wound C/D/I and Sternotomy wound is well healed. Drain sites still in completely closed, small amount of dried blood on dry  dressing Neuro: Alert/Oriented, Flat, Cranial Nerve II-XII normal, Abnormal Sensory Paresthesias left arm, Normal Motor and Tone:  Hypotonia Musc/Skel:  Other No pain with left shoulder range of motion. General no acute distress Motor strength is 2-/5 in the left deltoid, bicep, tricep,1/5 grip/wrist, 4+/5 in the left hip flexor, knee extensors, ankle dorsi flexion plantar flexor 5/5 in the right upper and right lower limb  Assessment/Plan: 1. Functional deficits secondary to right posterior frontal and corona radiata infarcts causing left upper extremity weakness, also deconditioned from coronary artery disease status post CABG which require 3+ hours per day of interdisciplinary therapy in a comprehensive inpatient rehab setting. Physiatrist is providing close team supervision and 24 hour management of active medical problems listed below. Physiatrist and rehab team continue to assess barriers to discharge/monitor patient progress toward functional and medical goals.  Finalizing dc planning for tomorrow  FIM: FIM - Bathing Bathing Steps Patient Completed: Chest;Abdomen;Front perineal area;Right upper leg;Left Arm;Left upper leg;Right lower leg (including foot);Left lower leg (including foot) Bathing: 4: Min-Patient completes 8-9 31f 10 parts or 75+ percent  FIM - Upper Body Dressing/Undressing Upper body dressing/undressing steps patient completed: Put head through opening of pull over shirt/dress;Pull shirt over trunk;Thread/unthread right sleeve of pullover shirt/dresss;Thread/unthread left sleeve of pullover shirt/dress Upper body dressing/undressing: 5: Set-up assist to: Obtain clothing/put away FIM - Lower Body Dressing/Undressing Lower body dressing/undressing steps patient completed: Thread/unthread right underwear leg;Thread/unthread left underwear leg;Thread/unthread right pants leg;Thread/unthread left pants leg;Pull underwear up/down;Pull pants up/down Lower body  dressing/undressing: 3: Mod-Patient completed 50-74% of tasks  FIM - Toileting Toileting steps completed by patient: Adjust clothing prior to toileting;Adjust clothing after toileting Toileting: 3: Mod-Patient completed 2 of 3 steps  FIM - Diplomatic Services operational officer Devices: Occupational hygienist Transfers: 5-To toilet/BSC: Supervision (verbal cues/safety issues);5-From toilet/BSC: Supervision (verbal cues/safety issues)  FIM - Press photographer Assistive Devices: HOB elevated Bed/Chair Transfer: 4: Supine > Sit: Min A (steadying Pt. > 75%/lift 1 leg);5: Bed > Chair or W/C: Supervision (verbal cues/safety issues);5: Chair or W/C > Bed: Supervision (verbal cues/safety issues)  FIM - Locomotion: Wheelchair Distance: 175'x1 Locomotion: Wheelchair: 1: Total Assistance/staff pushes wheelchair (Pt<25%) FIM - Locomotion: Ambulation Locomotion: Ambulation Assistive Devices: Occupational hygienist Ambulation/Gait Assistance: 5: Supervision Locomotion: Ambulation: 5: Travels 150 ft or more with supervision/safety issues  Comprehension Comprehension Mode: Auditory Comprehension: 5-Understands basic 90% of the time/requires cueing < 10% of the time  Expression Expression Mode: Verbal Expression: 5-Expresses basic needs/ideas: With no assist  Social Interaction Social Interaction: 5-Interacts appropriately 90% of the time - Needs monitoring or encouragement for participation or interaction.  Problem Solving Problem Solving Mode: Asleep Problem Solving: 5-Solves basic 90% of the time/requires cueing < 10% of the time  Memory Memory Mode: Asleep Memory:  5-Recognizes or recalls 90% of the time/requires cueing < 10% of the time  Medical Problem List and Plan:  1. DVT Prophylaxis/Anticoagulation: Pharmaceutical: Lovenox  2. Pain Management: prn medications effective.  3. Mood: Provide ego support--has supportive girlfriend. Will have LCSW follow for evaluation.  4.  Neuropsych: This patient is capable of making decisions on his own behalf.  5. Pericarditis: off imipenem. Has had low grade temp on two occasions over the last 4 days. clinically looks good 6. Antibiotic associated diarrhea:should improve off primaxin  -c diff tox + yesterday. Flagyl initiated 7. ABLA: Add iron supplement  8. CAD/s/p CABG X 1: sternal precautions.Appreciate CVTS note Continue ASA/plavix, lisinopril, zocor and metoprolol    LOS (Days) 9 A FACE TO FACE EVALUATION WAS PERFORMED  Ruthann Angulo T 02/13/2013, 8:50 AM

## 2013-02-13 NOTE — Progress Notes (Signed)
Physical Therapy Discharge Summary  Patient Details  Name: [name] MRN: [id number] Date of Birth: 03-01-1961  Today's Date: 02/13/2013 Time: 0865-7846 and 1331-1401 Time Calculation (min): 60 min and 30 min  Patient has met 11 of 11 long term goals due to improved activity tolerance, improved balance, improved postural control, increased strength, ability to compensate for deficits, functional use of  left upper extremity and left lower extremity and improved coordination.  Patient to discharge at in home ambulatory and community wheelchair  level Supervision.   Patient's fiance  educated on patient discharging home at supervision level for all transfer, ambulation, stair negotiation, and wheelchair mobility and verbalized understanding of recommendation for being present during all functional mobility for safety.   Reasons goals not met: N/A. All LTGs met.   Recommendation:  Patient will benefit from ongoing skilled PT services in home health setting to continue to advance safe functional mobility, address ongoing impairments in activity tolerance, gait, L UE NMR, balance, coordination, strength and minimize fall risk.  Equipment: 20x18 wheelchair and cushion and [facility]  Reasons for discharge: treatment goals met and discharge from hospital  Patient/family agrees with progress made and goals achieved: Yes  Skilled Therapeutic Intervention AM Session: Patient received seated in wheelchair. Session focused on bed mobility, functional transfers, gait training, and stair negotiation. See below for details. Patient reported feeling dizzy 3/10 following ambulation vitals taken seated in wheelchair BP 100/71, HR 105, O2 94. Vitals taken again following stair negotiation seated in wheelchair BP 101/71 HR 105, O2 96. Patient returned to room, stand pivot transfer wheelchair>bed supervision, sit>supine HOB flat, no bedrails supervision. Ended session with patient supine in bed, alarm on and all  needs within reach, patients friend present.   PM Session: Patient received supine in bed, fiance present. Discussion with patient and fiance regarding recommendation for supervision level mobility at discharge and for supervision stair negotiation with emphasis on ascending stairs with uninvolved LE and descending stairs with involved LE, patient and fiance verbalized understanding. Session focused on gait training, wheelchair mobility, and functional transfers. Patient supine>sit supervision HOB flat, no bedrails. Required assistance donning shoes. Patient performed gait training 150' [facility] supervision level, verbal cues of appropriate pacing and proper use of AD. Patient instructed in wheelchair mobility 150' B LE supervision level in community environment, patient required to navigate through nurses station and to navigate around obstacles in the hallway. Patient stand pivot transfer wheelchair>bed supervision no AD, sit>supine supervision. Ended session patient supine in bed, alarm on, all needs within reach, fiance present.   PT Discharge Precautions/Restrictions Precautions Precautions: Fall;Sternal Restrictions Weight Bearing Restrictions: No Vital Signs Therapy Vitals Pulse Rate: 105 BP: 100/71 mmHg Patient Position, if appropriate: Seated Oxygen Therapy SpO2: 94 % O2 Device: None (Room air) Pain Pain Assessment Pain Assessment: No/denies pain Pain Score: 0-No pain Vision/Perception  Vision - History Baseline Vision: Wears glasses only for reading Patient Visual Report: No change from baseline  Cognition Overall Cognitive Status: Within Functional Limits for tasks assessed Arousal/Alertness: Awake/alert Orientation Level: Oriented X4 Attention: Focused;Sustained Focused Attention: Appears intact Sustained Attention: Appears intact Memory: Appears intact Safety/Judgment: Appears intact Sensation Sensation Light Touch: Appears Intact Proprioception: Appears  Intact Coordination Gross Motor Movements are Fluid and Coordinated: No Fine Motor Movements are Fluid and Coordinated: No Coordination and Movement Description: Rapid alternating mvoements intact R UE, decreased speed and coordination L UE, secondary to decreased muscle activation. Heel Shin Test: no noted dysmetria B LE.  Motor  Motor Motor: Hemiplegia Motor -  Discharge Observations: Mild L hemiparesis, L UE>L LE  Mobility Bed Mobility Bed Mobility: Supine to Sit;Sit to Supine Supine to Sit: 5: Supervision;HOB flat Supine to Sit Details: Verbal cues for technique;Verbal cues for precautions/safety Supine to Sit Details (indicate cue type and reason): Supine>sit HOB flat, no bedrails, patient supine>L sidelying>sit EOB supervision level, with verbal cues for use of L elbow to assist acheiving upright trunk position.  Sit to Supine: 5: Supervision;HOB flat Sit to Supine - Details: Verbal cues for technique;Verbal cues for precautions/safety Sit to Supine - Details (indicate cue type and reason): Patient sit>supine HOB flat, no bedrails, supervision level. verbal cues for positiioning of trunk in bed.  Transfers Transfers: Yes Sit to Stand: 5: Supervision;From bed;From chair/3-in-1 Sit to Stand Details: Verbal cues for technique;Verbal cues for precautions/safety Sit to Stand Details (indicate cue type and reason): Patient sit>stand from bed and wheelchair supervision level, verbal cues for hand placement in lap.  Stand to Sit: 5: Supervision Stand to Sit Details (indicate cue type and reason): Verbal cues for technique;Verbal cues for precautions/safety Stand to Sit Details: Stand>sit supervision level from wheelchair/bed verbal cues for hand placement in lap.  Stand Pivot Transfers: 5: Supervision Stand Pivot Transfer Details: Verbal cues for precautions/safety Stand Pivot Transfer Details (indicate cue type and reason): Patient stand pivot transfer bed<>wheelchair supervision level no  AD, verbal cues for appropriate pacing.  Locomotion  Ambulation Ambulation: Yes Ambulation/Gait Assistance: 5: Supervision Ambulation Distance (Feet): 140 Feet and 150 Feet Assistive device: Large base quad cane Ambulation/Gait Assistance Details: Verbal cues for technique;Verbal cues for precautions/safety;Verbal cues for safe use of DME/AE Ambulation/Gait Assistance Details: Patient performed gait training 140'x1 [facility] supervision level, verbal cues for appropriate pacing and proper use of AD. Gait Gait: Yes Gait Pattern: Impaired Gait Pattern: Step-through pattern;Decreased dorsiflexion - left;Narrow base of support Stairs / Additional Locomotion Stairs: Yes Stairs Assistance: 5: Supervision Stairs Assistance Details: Verbal cues for sequencing;Verbal cues for technique;Verbal cues for precautions/safety Stairs Assistance Details (indicate cue type and reason): Patient instructed in stair negotiation 5steps and 13 steps supervision level, with B UE for support. Verbal cues for ascending with uninvolved leg and desending with involved leg.  Stair Management Technique: Two rails Number of Stairs: 5 (and 13 steps ) Height of Stairs: 6  Trunk/Postural Assessment  Cervical Assessment Cervical Assessment: Within Functional Limits Thoracic Assessment Thoracic Assessment: Within Functional Limits Lumbar Assessment Lumbar Assessment: Within Functional Limits Postural Control Postural Control: Deficits on evaluation Postural Limitations: forward head posture  Balance Balance Balance Assessed: Yes Static Sitting Balance Static Sitting - Balance Support: Feet supported Static Sitting - Level of Assistance: 6: Modified independent (Device/Increase time) Static Sitting - Comment/# of Minutes: 3 Dynamic Sitting Balance Dynamic Sitting - Balance Support: Feet supported Dynamic Sitting - Level of Assistance: 6: Modified independent (Device/Increase time) Static Standing Balance Static  Standing - Balance Support: Right upper extremity supported Static Standing - Level of Assistance: 5: Stand by assistance Static Standing - Comment/# of Minutes: 1 Dynamic Standing Balance Dynamic Standing - Balance Support: Right upper extremity supported;During functional activity Dynamic Standing - Level of Assistance: 5: Stand by assistance Extremity Assessment      RLE Assessment RLE Assessment: Within Functional Limits RLE Strength RLE Overall Strength: Within Functional Limits for tasks assessed RLE Overall Strength Comments: Grossly 4/5 hip flexion, DF/PF and 5/5 knee ext/flex LLE Assessment LLE Assessment: Exceptions to [facility] LLE Strength LLE Overall Strength: Deficits LLE Overall Strength Comments: hip flexion, DF  3+/5, knee flex/ext and  PF 4/5   See FIM for current functional status  Doralee Albino 02/13/2013, 12:25 PM

## 2013-02-13 NOTE — Plan of Care (Signed)
Problem: RH SKIN INTEGRITY Goal: RH STG ABLE TO PERFORM INCISION/WOUND CARE W/ASSISTANCE STG Able To Perform Incision/Wound Care With mod Assistance.  Outcome: Completed/Met Date Met:  02/13/13 Staff to perform dressing change; will need to have gf return demo at discharge

## 2013-02-14 NOTE — Progress Notes (Signed)
Subjective/Complaints: 52 y.o. male with history of CAD and recent NSTEMI 01/05/13 treated medically. He was readmitted on 01/31/13 with recurrent chest pain due to acute pericarditis and cardiac tamponade (Dressler syndrome). He underwent pericardiocentesis 10/11 with improvement in symptoms and drain removed 10/14. On 10/15, he developed LUE sensory loss with left hand weakness and ataxia,left visual deficits. He was noted to be hypotensive, hypoxic with fever and chills. He was intubated and started on broad spectrum antibiotics for sepsis and CT head negative. Repeat echo showed a complex effusion with RV collapse and tamponade. He was taken to OR the same day for CABG X1 with repair of LV ruptured inferoposterior pseudoaneurysm by Dr. Dorris Fetch.  Dr. Luciana Axe and Dr. Pearlean Brownie consulted for input. CTA head/neck with New acute/ subacute nonhemorrhagic infarcts involving the left superior cerebellum, the posterior and medial left parietal lobe and lateral left occipital lobe, the higher left frontal lobe, the posterior right frontal lobe and corona radiata. He was extubated without difficulty. Neurology feels that patient with embolic stroke on unknown source but suspected to be due to sepsis. Antibiotics narrowed to imipenem with recommendations for 14 total days antibiotics-D/C 10/29  Feeling well. Slept well. Encouraged by progress. Review of Systems - Negative except Left arm weakness  Objective: Vital Signs: Blood pressure 106/75, pulse 100, temperature 98.9 F (37.2 C), temperature source Oral, resp. rate 18, height 6\' 1"  (1.854 m), weight 89.5 kg (197 lb 5 oz), SpO2 94.00%. Dg Esophagus  02/12/2013   CLINICAL DATA:  Dysphagia. Recent strokes.  EXAM: ESOPHOGRAM/BARIUM SWALLOW  TECHNIQUE: Single contrast examination was performed using thin and thick barium as well as 13 mm barium tablet  COMPARISON:  None.  FLUOROSCOPY TIME:  1 min 23 seconds  FINDINGS: The oropharyngeal swallowing mechanisms are  normal. There is no aspiration or laryngeal penetration.  The mucosa and motility of the esophagus are normal. There is no visible hiatal hernia or gastroesophageal reflux. A 13 mm barium tablet passed from the mouth to the stomach with no significant delay.  IMPRESSION: Normal barium esophagram.   Electronically Signed   By: Geanie Cooley M.D.   On: 02/12/2013 16:21   Results for orders placed during the hospital encounter of 02/04/13 (from the past 72 hour(s))  CLOSTRIDIUM DIFFICILE BY PCR     Status: Abnormal   Collection Time    02/12/13  8:50 PM      Result Value Range   C difficile by pcr POSITIVE (*) NEGATIVE   Comment: CRITICAL RESULT CALLED TO, READ BACK BY AND VERIFIED WITH:     SHARP RN 8:55 02/13/13 ([email or web address])  BASIC METABOLIC PANEL     Status: Abnormal   Collection Time    02/13/13 11:00 AM      Result Value Range   Sodium 129 (*) 135 - 145 mEq/L   Potassium 3.3 (*) 3.5 - 5.1 mEq/L   Chloride 89 (*) 96 - 112 mEq/L   CO2 23  19 - 32 mEq/L   Glucose, Bld 106 (*) 70 - 99 mg/dL   BUN 11  6 - 23 mg/dL   Creatinine, Ser 2.95  0.50 - 1.35 mg/dL   Calcium 9.2  8.4 - 28.4 mg/dL   GFR calc non Af Amer >90  >90 mL/min   GFR calc Af Amer >90  >90 mL/min   Comment: (NOTE)     The eGFR has been calculated using the CKD EPI equation.     This calculation has not been validated in all clinical  situations.     eGFR's persistently <90 mL/min signify possible Chronic Kidney     Disease.  CBC     Status: Abnormal   Collection Time    02/13/13 11:00 AM      Result Value Range   WBC 6.5  4.0 - 10.5 K/uL   RBC 3.63 (*) 4.22 - 5.81 MIL/uL   Hemoglobin 10.5 (*) 13.0 - 17.0 g/dL   HCT 16.1 (*) 09.6 - 04.5 %   MCV 84.8  78.0 - 100.0 fL   MCH 28.9  26.0 - 34.0 pg   MCHC 34.1  30.0 - 36.0 g/dL   RDW 40.9  81.1 - 91.4 %   Platelets 468 (*) 150 - 400 K/uL     HEENT: normal Cardio: RRR and No murmurs or extra sounds Resp: CTA B/L and Unlabored GI: BS positive and Nontender  nondistended Extremity:  Pulses positive and Edema Left dorsal hand Skin:   Wound C/D/I and Sternotomy wound is well healed. Drain sites still in completely closed, small amount of dried blood on dry dressing Neuro: Alert/Oriented, Flat, Cranial Nerve II-XII normal, Abnormal Sensory Paresthesias left arm, Normal Motor and Tone:  Hypotonia Musc/Skel:  Other No pain with left shoulder range of motion. General no acute distress Motor strength is 2-/5 in the left deltoid, bicep, tricep,1/5 grip/wrist, 4+/5 in the left hip flexor, knee extensors, ankle dorsi flexion plantar flexor 5/5 in the right upper and right lower limb  Assessment/Plan: 1. Functional deficits secondary to right posterior frontal and corona radiata infarcts causing left upper extremity weakness, also deconditioned from coronary artery disease status post CABG which require 3+ hours per day of interdisciplinary therapy in a comprehensive inpatient rehab setting. Physiatrist is providing close team supervision and 24 hour management of active medical problems listed below. Physiatrist and rehab team continue to assess barriers to discharge/monitor patient progress toward functional and medical goals.  Dc today   FIM: FIM - Bathing Bathing Steps Patient Completed: Chest;Right Arm;Left Arm;Abdomen;Front perineal area;Right upper leg;Left upper leg;Right lower leg (including foot);Left lower leg (including foot) (LH sponge) Bathing: 4: Min-Patient completes 8-9 35f 10 parts or 75+ percent  FIM - Upper Body Dressing/Undressing Upper body dressing/undressing steps patient completed: Thread/unthread right sleeve of pullover shirt/dresss;Thread/unthread left sleeve of pullover shirt/dress;Put head through opening of pull over shirt/dress;Pull shirt over trunk Upper body dressing/undressing: 5: Supervision: Safety issues/verbal cues FIM - Lower Body Dressing/Undressing Lower body dressing/undressing steps patient completed:  Thread/unthread right underwear leg;Thread/unthread left underwear leg;Pull underwear up/down;Thread/unthread right pants leg;Thread/unthread left pants leg;Pull pants up/down Lower body dressing/undressing: 3: Mod-Patient completed 50-74% of tasks  FIM - Toileting Toileting steps completed by patient: Adjust clothing prior to toileting Toileting: 3: Mod-Patient completed 2 of 3 steps  FIM - Diplomatic Services operational officer Devices: Occupational hygienist Transfers: 5-To toilet/BSC: Supervision (verbal cues/safety issues);5-From toilet/BSC: Supervision (verbal cues/safety issues)  FIM - Press photographer Assistive Devices: HOB elevated Bed/Chair Transfer: 5: Supine > Sit: Supervision (verbal cues/safety issues);5: Sit > Supine: Supervision (verbal cues/safety issues);5: Chair or W/C > Bed: Supervision (verbal cues/safety issues);5: Bed > Chair or W/C: Supervision (verbal cues/safety issues)  FIM - Locomotion: Wheelchair Distance: 150 Locomotion: Wheelchair: 5: Travels 150 ft or more: maneuvers on rugs and over door sills with supervision, cueing or coaxing FIM - Locomotion: Ambulation Locomotion: Ambulation Assistive Devices: Occupational hygienist Ambulation/Gait Assistance: 5: Supervision Locomotion: Ambulation: 5: Travels 150 ft or more with supervision/safety issues  Comprehension Comprehension Mode: Auditory Comprehension: 5-Follows  basic conversation/direction: With no assist  Expression Expression Mode: Verbal Expression: 5-Expresses basic 90% of the time/requires cueing < 10% of the time.  Social Interaction Social Interaction: 6-Interacts appropriately with others with medication or extra time (anti-anxiety, antidepressant).  Problem Solving Problem Solving Mode: Asleep Problem Solving: 5-Solves basic 90% of the time/requires cueing < 10% of the time  Memory Memory Mode: Asleep Memory: 5-Recognizes or recalls 90% of the time/requires cueing < 10% of the  time  Medical Problem List and Plan:  1. DVT Prophylaxis/Anticoagulation: Pharmaceutical: Lovenox  2. Pain Management: prn medications effective.  3. Mood: Provide ego support--has supportive girlfriend. Will have LCSW follow for evaluation.  4. Neuropsych: This patient is capable of making decisions on his own behalf.  5. Pericarditis: off imipenem. Has had low grade temp on two occasions over the last 4 days. clinically looks good 6. Antibiotic associated diarrhea:   -c diff tox + yesterday. Flagyl initiated  -pt with loose stools but not overly frequent--he feels he can manage at home  -recommended aggressive fluid intake 7. ABLA: Added iron supplement  8. CAD/s/p CABG X 1: sternal precautions.Appreciate CVTS note Continue ASA/plavix, lisinopril, zocor and metoprolol    LOS (Days) 10 A FACE TO FACE EVALUATION WAS PERFORMED  Jaasia Viglione T 02/14/2013, 7:41 AM

## 2013-02-17 NOTE — Consult Note (Signed)
NEUROCOGNITIVE TESTING - CONFIDENTIAL [name]   [name] is a 52 year old man, who was previously seen for a diagnostic evaluation by neuropsychologist Robert Fisherman, PsyD to evaluate [name] cognitive and emotional functioning post-stroke.  During that visit, [name] described experiencing cognitive deficits since [name] stroke that [name] felt were improving.  However, owing to continued cognitive problems, [name] referred [name] for a more extensive neurocognitive screening to better evaluate [name] cognitive strengths and weaknesses in order to provide treatment and discharge recommendations.    PROCEDURES: [3 units of 16109 on 02/11/13]  The following tests were performed during today's visit: Mini Mental Status Examination -2 (brief version), Repeatable Battery for the Assessment of Neuropsychological Status (RBANS, form A), and the Beck Depression Inventory (fast screen for medical patients).  Test results are as follows:   MMSE-2 brief Raw Score = 11/16 Description = Impaired   RBANS Indices Scaled Score Percentile Description  Immediate Memory  78 7 Borderline  Visuospatial/Constructional 56 < 1 Profoundly Impaired  Language 75 5 Impaired  Attention 56 < 1 Profoundly Impaired  Delayed Memory 77 6 Impaired  Total Score 60 < 1 Profoundly Impaired   RBANS Subtests Raw Score Percentile Description  List Learning 20 5 Impaired  Story Memory 15 25 Average  Figure Copy 9 < 1 Profoundly Impaired  Line Orientation 8 < 1 Profoundly Impaired  Picture Naming 9 32 Average  Semantic Fluency 7 < 1 Profoundly Impaired  Digit Span 8 14 Below Average  Coding 0 < 1 Profoundly Impaired  List Recall 0 < 1 Profoundly Impaired  List Recognition 19 32 Average  Story Recall 2 < 1 Profoundly Impaired  Figure recall 5 < 1 Profoundly Impaired   BDI - fast Raw Score = 0 Description = WNL   Of note, [name] reported feeling ill at the time of the current  evaluation, but it was unable to be rescheduled prior to [name] discharge.  [name] had an upset stomach and in fact, testing was halted for approximately half an hour during the middle of the evaluation in order for [name] to address [name] health issues.  [name] opted to continue with the evaluation in spite of [name] health.    [name] performance on an embedded measure of performance validity was within normal limits, indicating that [name] was able to actively engage in the tasks.  [name] test results reflected multiple areas of impairment across cognitive domains, with relative sparing of confrontation naming, simple attention, and initial ability to encode contextually-based information.  All other performances were markedly impaired.  It is likely that [name] is having cognitive sequelae from [name] stroke, but there was also likely some adverse impact from [name] current health state and it is possible that if [name] was physically feeling better, [name] may perform better on testing.  From an emotional standpoint, [name] did not endorse symptoms consistent with clinically significant depression at this time.    We discussed the expected course of cognitive recovery in stroke.  A more comprehensive neuropsychological evaluation is indicated one year post-stroke to extensively examine [name] cognitive abilities and provide [name] with recommendations for compensatory strategies for any residual cognitive weaknesses at that time.    In light of these findings, the following recommendations are provided.    RECOMMENDATIONS:  Recommendations for treatment team:     When interacting with [name], directions and information should be provided in a simple, straight forward manner, and the treatment team should avoid giving  multiple instructions simultaneously.    [name] may also benefit from being provided with multiple trials to learn new skills given the noted memory inefficiencies.    To the extent possible, multitasking should  be avoided.   [name] requires more time than typical to process information. The treatment team may benefit from waiting for a verbal response to information before presenting additional information.    Performance will generally be best in a structured, routine, and familiar environment, as opposed to situations involving complex problems.   Recommendations for discharge planning:    Complete a comprehensive neuropsychological evaluation as an outpatient in 12 months to assess for interval change and provide treatment recommendations.     Maintain engagement in mentally, physically and cognitively stimulating activities.    Strive to maintain a healthy lifestyle (e.g., proper diet and exercise) in order to promote physical, cognitive and emotional health.   Leavy Cella, Psy.D.  Clinical Neuropsychologist

## 2013-02-19 ENCOUNTER — Other Ambulatory Visit: Payer: Self-pay

## 2013-02-24 LAB — FUNGUS CULTURE W SMEAR
Fungal Smear: NONE SEEN
Fungal Smear: NONE SEEN
Fungal Smear: NONE SEEN

## 2013-02-25 LAB — FUNGUS CULTURE W SMEAR: Fungal Smear: NONE SEEN

## 2013-02-27 ENCOUNTER — Other Ambulatory Visit: Payer: Self-pay | Admitting: *Deleted

## 2013-02-27 DIAGNOSIS — I2510 Atherosclerotic heart disease of native coronary artery without angina pectoris: Secondary | ICD-10-CM

## 2013-03-03 ENCOUNTER — Ambulatory Visit (INDEPENDENT_AMBULATORY_CARE_PROVIDER_SITE_OTHER): Payer: Medicaid Other | Admitting: Thoracic Surgery (Cardiothoracic Vascular Surgery)

## 2013-03-03 ENCOUNTER — Ambulatory Visit
Admission: RE | Admit: 2013-03-03 | Discharge: 2013-03-03 | Disposition: A | Discharge status: Home / Self Care | Payer: Medicaid Other | Source: Ambulatory Visit | Attending: Thoracic Surgery (Cardiothoracic Vascular Surgery) | Admitting: Thoracic Surgery (Cardiothoracic Vascular Surgery)

## 2013-03-03 VITALS — BP 102/68 | HR 80 | Temp 97.1°F | Resp 16 | Ht 73.0 in | Wt 197.0 lb

## 2013-03-03 DIAGNOSIS — Z09 Encounter for follow-up examination after completed treatment for conditions other than malignant neoplasm: Secondary | ICD-10-CM

## 2013-03-03 DIAGNOSIS — I2510 Atherosclerotic heart disease of native coronary artery without angina pectoris: Secondary | ICD-10-CM

## 2013-03-03 DIAGNOSIS — I253 Aneurysm of heart: Secondary | ICD-10-CM

## 2013-03-03 DIAGNOSIS — Z951 Presence of aortocoronary bypass graft: Secondary | ICD-10-CM

## 2013-03-03 NOTE — Progress Notes (Signed)
HPI:  [name] returns today for a scheduled postoperative followup visit.  He is a 52 year old gentleman who suffered an out of hospital MI back in early October. He presented with pericarditis and had a pericardial catheter placed. The day after that was removed he had a sudden deterioration. He developed left-sided weakness and then had hemodynamic collapse. He turned out to have a contained, ruptured inferior wall LV pseudoaneurysm. He was taken to the OR and had a patch placed to repair the pseudoaneurysm and CABG x1.  His postoperative course was encountered from a cardiac standpoint, but he did go to rehabilitation for his stroke. He now has been discharged from rehabilitation he says that he's getting more use from his left arm. He is not having much pain. His exercise tolerance is improving. He does complain of a localized rash on his left shoulder.  Past Medical History  Diagnosis Date  . Coronary artery disease     a. s/p [place] in 2009 [place] with stenting of the LCX and LAD;  b. 12/2012 NSTEMI/CAD: LM nl, LAD patent prox stent, LCX 50-70 isr (FFR 0.84), RCA dom, 123m, EF 40-45%, sev basal to mid inf HK to AK-->Med Rx.  . Tobacco abuse     a. quit 12/2012.  . Ischemic cardiomyopathy     a. 12/2012 Ech: EF 40-45%, Gr 1 DD.  Marland Kitchen Hyperlipidemia       Current Outpatient Prescriptions  Medication Sig Dispense Refill  . aspirin EC 325 MG EC tablet Take 1 tablet (325 mg total) by mouth daily.  30 tablet  0  . clopidogrel (PLAVIX) 75 MG tablet Take 1 tablet (75 mg total) by mouth daily.  30 tablet  11  . famotidine (PEPCID) 20 MG tablet Take 1 tablet (20 mg total) by mouth 2 (two) times daily.  60 tablet  1  . furosemide (LASIX) 20 MG tablet Take 1 tablet (20 mg total) by mouth 2 (two) times daily.  60 tablet  1  . lisinopril (PRINIVIL,ZESTRIL) 5 MG tablet Take 0.5 tablets (2.5 mg total) by mouth daily.  30 tablet  11  . metoprolol succinate (TOPROL-XL) 25 MG 24 hr tablet Take 3 tablets  (75 mg total) by mouth daily.  90 tablet  1  . metroNIDAZOLE (FLAGYL) 500 MG tablet Take 1 tablet (500 mg total) by mouth every 8 (eight) hours.  36 tablet  0  . potassium chloride SA (K-DUR,KLOR-CON) 20 MEQ tablet Take 1 tablet (20 mEq total) by mouth 3 (three) times daily.  90 tablet  1  . saccharomyces boulardii (FLORASTOR) 250 MG capsule Take 2 capsules (500 mg total) by mouth 2 (two) times daily.  80 capsule  0  . simvastatin (ZOCOR) 40 MG tablet Take 1 tablet (40 mg total) by mouth at bedtime.  30 tablet  11   No current facility-administered medications for this visit.    Physical Exam BP 102/68  Pulse 80  Temp(Src) 97.1 F (36.2 C) (Oral)  Resp 16  Ht 6\' 1"  (1.854 m)  Wt 197 lb (89.359 kg)  BMI 26.00 kg/m2  SpO71 58% 52 year old male in no acute distress Neurologic alert and oriented x3, left upper extremity weakness improved able to lift arm and move fingers Cardiac regular rate and rhythm normal S1 and S2 Sternum stable, incision healing well Left leg incision healing well. No peripheral edema  Diagnostic Tests: Chest x-ray 03/03/2013 CHEST 2 VIEW  COMPARISON: 02/04/2013 and earlier.  FINDINGS:  Sequelae of median sternotomy. Right  IJ central line removed. Lung  volumes a. Baseline. No pneumothorax, pulmonary edema, pleural  effusion or consolidation. Mild linear scarring or atelectasis at  the left lung base. Visualized tracheal air column is within normal  limits. Stable visualized osseous structures.  IMPRESSION:  Minor left lung base atelectasis or scarring. No acute  cardiopulmonary abnormality.  Electronically Signed  By: Augusto Gamble M.D.  On: 03/03/2013 10:18  Impression: 52 year old gentleman status post emergent repair of a ruptured left ventricular pseudoaneurysm and coronary bypass grafting x1 on October 15. He is doing remarkably well at this point in time. He did have a stroke from embolization at the time of his rupture. He is making progress with his  rehabilitation for that.  I advised him not to lift anything over 10 pounds for another 2 weeks and nothing over 20 pounds for another 4 weeks. He should be cleared by neurology prior to driving, but there no cardiac surgical issues for that.  Not sure what the rash on his left shoulder is. It is scaly and very localized. It most likely is a hypersensitivity reaction to something that was on the left shoulder during his hospitalization. I advised him to put hydrocortisone cream on that.  Plan: He needs to followup with neurology and cardiology.  I will be happy to see him back any time if I can be of any further assistance with his care.

## 2013-03-05 ENCOUNTER — Telehealth: Payer: Self-pay

## 2013-03-05 NOTE — Telephone Encounter (Signed)
[facility] with [facility] called regarding patient missing appointment due to his father being ill.  Patient has discharge appointment next week.  This is just an [place]

## 2013-03-09 ENCOUNTER — Telehealth: Payer: Self-pay | Admitting: Emergency Medicine

## 2013-03-09 ENCOUNTER — Telehealth: Payer: Self-pay | Admitting: Cardiology

## 2013-03-09 NOTE — Telephone Encounter (Signed)
Returned call to [name] at [facility] she stated patient's O2 sat drops down to 84 % with exertion, but at rest O2 sat 94%.Patient has appointment with [name] NP 03/18/13.Advised to have patient rest and try not do to do anything exertional until he sees [name] next week.Advised to call back sooner if needed.

## 2013-03-09 NOTE — Telephone Encounter (Signed)
New problem    On excertion pt's oxygen dropped to 84% at rest 93-94%. Pt feeling little bit more SOB. Please advise

## 2013-03-09 NOTE — Telephone Encounter (Signed)
Home care nurse [facility] called requesting new pt appt. Scheduled appt 03/30/13 @ 915 am

## 2013-03-10 NOTE — Telephone Encounter (Signed)
Received call from Tracey at [facility] NP advised patient needs home O2 at 2L/min.Order faxed to respiratory at fax # [phone].Advised to keep appointment with [name] 03/18/13.

## 2013-03-10 NOTE — Telephone Encounter (Signed)
Returned call to [facility] at [facility] no answer.Left message on her personal voice mail spoke to [name] NP she advised patient needs O2 at 2L/min.Advised to call me back.

## 2013-03-11 ENCOUNTER — Telehealth: Payer: Self-pay | Admitting: Cardiology

## 2013-03-11 NOTE — Telephone Encounter (Signed)
Noted. Will take care of at his visit.

## 2013-03-11 NOTE — Telephone Encounter (Signed)
New problem]   What you sent to [facility] isn't enough for an order. She need more information. Please call

## 2013-03-11 NOTE — Telephone Encounter (Signed)
Returned call to [facility] at [facility] she stated respiratory needs the following before patient can receive home O2.#1 needs qualifying diagnosis #2 office note showing patient's diagnosis #3 other methods tried #4 sat levels at room temp at rest,and with exertion #5 order has to be signed by Dr or PA with NPI #  #6 order needs to say if O2 is continuous or at night or with exertion.Patient will keep appointment with [name] NP 03/18/13.Message sent to [name] NP for her review.

## 2013-03-12 LAB — AFB CULTURE WITH SMEAR (NOT AT ARMC)
Acid Fast Smear: NONE SEEN
Acid Fast Smear: NONE SEEN

## 2013-03-16 ENCOUNTER — Telehealth: Payer: Self-pay | Admitting: Cardiology

## 2013-03-16 NOTE — Telephone Encounter (Signed)
Left message on machine for Tracey to contact the office.   

## 2013-03-16 NOTE — Telephone Encounter (Signed)
New problem    1. Need consult for speech therapy. C/O difficulties swallowing.    2. Need an order for nurse visit for next week follow up , education, teaching.

## 2013-03-17 ENCOUNTER — Ambulatory Visit: Payer: Medicaid Other | Attending: Internal Medicine

## 2013-03-17 ENCOUNTER — Ambulatory Visit: Payer: Self-pay | Admitting: Nurse Practitioner

## 2013-03-17 NOTE — Telephone Encounter (Signed)
Returned call to Holcomb at [facility] she stated  patient does not have a PCP.He has appointment with Dr.Olubemiga 03/30/13.Stated patient is having difficulty swallowing.Stated after he eats he feels like he needs to gag.Stated she would like a order for speech therapy and a order for nurse visit next week to follow up,education,teaching.Stated patient has appointment with [name] NP 03/18/13, would like for her to fax order to [facility] fax # [phone].Message sent to [facility].

## 2013-03-17 NOTE — Telephone Encounter (Signed)
Looks like Dr. Patty Sermons is his primary cardiologist - will route to him.

## 2013-03-18 ENCOUNTER — Observation Stay: Admission: AD | Admit: 2013-03-18 | Payer: Self-pay | Source: Ambulatory Visit | Admitting: Cardiovascular Disease

## 2013-03-18 ENCOUNTER — Emergency Department (HOSPITAL_COMMUNITY): Payer: Medicaid Other

## 2013-03-18 ENCOUNTER — Other Ambulatory Visit: Payer: Self-pay | Admitting: *Deleted

## 2013-03-18 ENCOUNTER — Encounter (HOSPITAL_COMMUNITY): Payer: Self-pay | Admitting: Emergency Medicine

## 2013-03-18 ENCOUNTER — Encounter: Payer: Self-pay | Admitting: Nurse Practitioner

## 2013-03-18 ENCOUNTER — Ambulatory Visit (INDEPENDENT_AMBULATORY_CARE_PROVIDER_SITE_OTHER): Payer: Medicaid Other | Admitting: Nurse Practitioner

## 2013-03-18 ENCOUNTER — Inpatient Hospital Stay (HOSPITAL_COMMUNITY): Payer: Medicaid Other

## 2013-03-18 ENCOUNTER — Inpatient Hospital Stay (HOSPITAL_COMMUNITY)
Admission: EM | Admit: 2013-03-18 | Discharge: 2013-03-25 | DRG: 314 | Disposition: A | Discharge status: Skilled Nursing Facility | Payer: Medicaid Other | Attending: Cardiology | Admitting: Cardiology

## 2013-03-18 VITALS — BP 100/60 | HR 126 | Ht 73.0 in | Wt 189.0 lb

## 2013-03-18 DIAGNOSIS — Z951 Presence of aortocoronary bypass graft: Secondary | ICD-10-CM

## 2013-03-18 DIAGNOSIS — Z9861 Coronary angioplasty status: Secondary | ICD-10-CM

## 2013-03-18 DIAGNOSIS — Z7982 Long term (current) use of aspirin: Secondary | ICD-10-CM

## 2013-03-18 DIAGNOSIS — R0902 Hypoxemia: Secondary | ICD-10-CM

## 2013-03-18 DIAGNOSIS — B3781 Candidal esophagitis: Secondary | ICD-10-CM | POA: Diagnosis present

## 2013-03-18 DIAGNOSIS — I313 Pericardial effusion (noninflammatory): Secondary | ICD-10-CM

## 2013-03-18 DIAGNOSIS — I2582 Chronic total occlusion of coronary artery: Secondary | ICD-10-CM | POA: Diagnosis present

## 2013-03-18 DIAGNOSIS — I429 Cardiomyopathy, unspecified: Secondary | ICD-10-CM | POA: Diagnosis present

## 2013-03-18 DIAGNOSIS — I639 Cerebral infarction, unspecified: Secondary | ICD-10-CM | POA: Diagnosis present

## 2013-03-18 DIAGNOSIS — R1310 Dysphagia, unspecified: Secondary | ICD-10-CM | POA: Diagnosis present

## 2013-03-18 DIAGNOSIS — Z79899 Other long term (current) drug therapy: Secondary | ICD-10-CM

## 2013-03-18 DIAGNOSIS — I3139 Other pericardial effusion (noninflammatory): Secondary | ICD-10-CM

## 2013-03-18 DIAGNOSIS — T783XXA Angioneurotic edema, initial encounter: Secondary | ICD-10-CM | POA: Diagnosis present

## 2013-03-18 DIAGNOSIS — Z7902 Long term (current) use of antithrombotics/antiplatelets: Secondary | ICD-10-CM

## 2013-03-18 DIAGNOSIS — Z87891 Personal history of nicotine dependence: Secondary | ICD-10-CM

## 2013-03-18 DIAGNOSIS — J9601 Acute respiratory failure with hypoxia: Secondary | ICD-10-CM | POA: Diagnosis present

## 2013-03-18 DIAGNOSIS — I69998 Other sequelae following unspecified cerebrovascular disease: Secondary | ICD-10-CM

## 2013-03-18 DIAGNOSIS — I241 Dressler's syndrome: Principal | ICD-10-CM | POA: Diagnosis present

## 2013-03-18 DIAGNOSIS — I319 Disease of pericardium, unspecified: Secondary | ICD-10-CM | POA: Diagnosis present

## 2013-03-18 DIAGNOSIS — I2589 Other forms of chronic ischemic heart disease: Secondary | ICD-10-CM | POA: Diagnosis present

## 2013-03-18 DIAGNOSIS — R079 Chest pain, unspecified: Secondary | ICD-10-CM

## 2013-03-18 DIAGNOSIS — R1312 Dysphagia, oropharyngeal phase: Secondary | ICD-10-CM | POA: Diagnosis present

## 2013-03-18 DIAGNOSIS — I951 Orthostatic hypotension: Secondary | ICD-10-CM | POA: Diagnosis present

## 2013-03-18 DIAGNOSIS — J9600 Acute respiratory failure, unspecified whether with hypoxia or hypercapnia: Secondary | ICD-10-CM | POA: Diagnosis present

## 2013-03-18 DIAGNOSIS — I252 Old myocardial infarction: Secondary | ICD-10-CM

## 2013-03-18 DIAGNOSIS — I5023 Acute on chronic systolic (congestive) heart failure: Secondary | ICD-10-CM | POA: Diagnosis present

## 2013-03-18 DIAGNOSIS — I509 Heart failure, unspecified: Secondary | ICD-10-CM | POA: Diagnosis present

## 2013-03-18 DIAGNOSIS — A0472 Enterocolitis due to Clostridium difficile, not specified as recurrent: Secondary | ICD-10-CM | POA: Diagnosis present

## 2013-03-18 DIAGNOSIS — I10 Essential (primary) hypertension: Secondary | ICD-10-CM | POA: Diagnosis present

## 2013-03-18 DIAGNOSIS — I259 Chronic ischemic heart disease, unspecified: Secondary | ICD-10-CM

## 2013-03-18 DIAGNOSIS — R5381 Other malaise: Secondary | ICD-10-CM

## 2013-03-18 DIAGNOSIS — E785 Hyperlipidemia, unspecified: Secondary | ICD-10-CM | POA: Diagnosis present

## 2013-03-18 DIAGNOSIS — E871 Hypo-osmolality and hyponatremia: Secondary | ICD-10-CM | POA: Diagnosis present

## 2013-03-18 DIAGNOSIS — I309 Acute pericarditis, unspecified: Secondary | ICD-10-CM | POA: Diagnosis present

## 2013-03-18 DIAGNOSIS — R29898 Other symptoms and signs involving the musculoskeletal system: Secondary | ICD-10-CM | POA: Diagnosis present

## 2013-03-18 DIAGNOSIS — I2510 Atherosclerotic heart disease of native coronary artery without angina pectoris: Secondary | ICD-10-CM | POA: Diagnosis present

## 2013-03-18 DIAGNOSIS — D72829 Elevated white blood cell count, unspecified: Secondary | ICD-10-CM | POA: Diagnosis present

## 2013-03-18 DIAGNOSIS — D62 Acute posthemorrhagic anemia: Secondary | ICD-10-CM | POA: Diagnosis present

## 2013-03-18 HISTORY — DX: Acute respiratory failure, unspecified whether with hypoxia or hypercapnia: J96.00

## 2013-03-18 HISTORY — DX: Cerebral infarction, unspecified: I63.9

## 2013-03-18 HISTORY — DX: Aneurysm of heart: I25.3

## 2013-03-18 HISTORY — DX: Enterocolitis due to Clostridium difficile, not specified as recurrent: A04.72

## 2013-03-18 HISTORY — DX: Cardiac tamponade: I31.4

## 2013-03-18 HISTORY — DX: Acute pericarditis, unspecified: I30.9

## 2013-03-18 HISTORY — DX: Dressler's syndrome: I24.1

## 2013-03-18 HISTORY — DX: Hypo-osmolality and hyponatremia: E87.1

## 2013-03-18 LAB — COMPREHENSIVE METABOLIC PANEL
Alkaline Phosphatase: 147 U/L — ABNORMAL HIGH (ref 39–117)
BUN: 7 mg/dL (ref 6–23)
Calcium: 9.2 mg/dL (ref 8.4–10.5)
Chloride: 93 mEq/L — ABNORMAL LOW (ref 96–112)
GFR calc Af Amer: >90 mL/min (ref >90)
Glucose, Bld: 110 mg/dL — ABNORMAL HIGH (ref 70–99)
Potassium: 3.6 mEq/L (ref 3.5–5.1)
Total Bilirubin: 0.5 mg/dL (ref 0.3–1.2)

## 2013-03-18 LAB — POCT I-STAT TROPONIN I: Troponin i, poc: 1.35 ng/mL (ref 0.00–0.08)

## 2013-03-18 LAB — CBC
HCT: 34.8 % — ABNORMAL LOW (ref 39.0–52.0)
Hemoglobin: 11.9 g/dL — ABNORMAL LOW (ref 13.0–17.0)
Platelets: 246 10*3/uL (ref 150–400)
WBC: 9.5 10*3/uL (ref 4.0–10.5)

## 2013-03-18 LAB — HEPARIN LEVEL (UNFRACTIONATED)
Heparin Unfractionated: <0.1 [IU]/mL — ABNORMAL LOW (ref 0.30–0.70)
Heparin Unfractionated: <0.1 IU/mL — ABNORMAL LOW (ref 0.30–0.70)

## 2013-03-18 LAB — PRO B NATRIURETIC PEPTIDE: Pro B Natriuretic peptide (BNP): 2568 pg/mL — ABNORMAL HIGH (ref 0–125)

## 2013-03-18 LAB — PROTIME-INR: Prothrombin Time: 15.2 seconds (ref 11.6–15.2)

## 2013-03-18 LAB — TROPONIN I: Troponin I: 1.94 ng/mL (ref <0.30)

## 2013-03-18 LAB — SEDIMENTATION RATE: Sed Rate: 60 mm/hr — ABNORMAL HIGH (ref 0–16)

## 2013-03-18 MED ORDER — SODIUM CHLORIDE 0.9 % IJ SOLN: 3.0000 mL | INTRAMUSCULAR | Status: DC | PRN

## 2013-03-18 MED ORDER — FAMOTIDINE 20 MG PO TABS
20.0000 mg | ORAL_TABLET | Freq: Two times a day (BID) | ORAL | Status: DC
  Administered 2013-03-18 – 2013-03-25 (×14): 20 mg via ORAL
  Filled 2013-03-18 (×15): qty 1

## 2013-03-18 MED ORDER — HEPARIN (PORCINE) IN NACL 100-0.45 UNIT/ML-% IJ SOLN
1700.0000 [IU]/h | INTRAMUSCULAR | Status: DC
  Administered 2013-03-19: 1400 [IU]/h via INTRAVENOUS
  Filled 2013-03-18 (×2): qty 250

## 2013-03-18 MED ORDER — CLOPIDOGREL BISULFATE 75 MG PO TABS
75.0000 mg | ORAL_TABLET | Freq: Every day | ORAL | Status: DC
  Administered 2013-03-18 – 2013-03-25 (×8): 75 mg via ORAL
  Filled 2013-03-18 (×8): qty 1

## 2013-03-18 MED ORDER — ONDANSETRON HCL 4 MG/2ML IJ SOLN: 4.0000 mg | Freq: Four times a day (QID) | INTRAMUSCULAR | Status: DC | PRN

## 2013-03-18 MED ORDER — HEPARIN (PORCINE) IN NACL 100-0.45 UNIT/ML-% IJ SOLN
1150.0000 [IU]/h | INTRAMUSCULAR | Status: DC
  Administered 2013-03-18: 1150 [IU]/h via INTRAVENOUS
  Filled 2013-03-18: qty 250

## 2013-03-18 MED ORDER — POTASSIUM CHLORIDE CRYS ER 20 MEQ PO TBCR
20.0000 meq | EXTENDED_RELEASE_TABLET | Freq: Three times a day (TID) | ORAL | Status: DC
  Administered 2013-03-18 – 2013-03-25 (×21): 20 meq via ORAL
  Filled 2013-03-18 (×26): qty 1

## 2013-03-18 MED ORDER — SODIUM CHLORIDE 0.9 % IJ SOLN
3.0000 mL | Freq: Two times a day (BID) | INTRAMUSCULAR | Status: DC
  Administered 2013-03-19 – 2013-03-25 (×13): 3 mL via INTRAVENOUS

## 2013-03-18 MED ORDER — IOHEXOL 350 MG/ML SOLN
80.0000 mL | Freq: Once | INTRAVENOUS | Status: AC | PRN
  Administered 2013-03-18: 63 mL via INTRAVENOUS

## 2013-03-18 MED ORDER — NITROGLYCERIN 0.4 MG SL SUBL: 0.4000 mg | SUBLINGUAL_TABLET | SUBLINGUAL | Status: DC | PRN

## 2013-03-18 MED ORDER — FUROSEMIDE 20 MG PO TABS
20.0000 mg | ORAL_TABLET | Freq: Two times a day (BID) | ORAL | Status: DC
  Administered 2013-03-18 – 2013-03-21 (×6): 20 mg via ORAL
  Filled 2013-03-18 (×11): qty 1

## 2013-03-18 MED ORDER — METOPROLOL SUCCINATE ER 50 MG PO TB24
75.0000 mg | ORAL_TABLET | Freq: Every day | ORAL | Status: DC
  Administered 2013-03-18 – 2013-03-19 (×2): 75 mg via ORAL
  Filled 2013-03-18 (×3): qty 1

## 2013-03-18 MED ORDER — ASPIRIN 81 MG PO CHEW
324.0000 mg | CHEWABLE_TABLET | Freq: Once | ORAL | Status: AC
  Administered 2013-03-18: 324 mg via ORAL
  Filled 2013-03-18: qty 4

## 2013-03-18 MED ORDER — ASPIRIN EC 325 MG PO TBEC
325.0000 mg | DELAYED_RELEASE_TABLET | Freq: Every day | ORAL | Status: DC
  Administered 2013-03-18: 325 mg via ORAL
  Filled 2013-03-18 (×2): qty 1

## 2013-03-18 MED ORDER — HEPARIN BOLUS VIA INFUSION
4000.0000 [IU] | Freq: Once | INTRAVENOUS | Status: AC
  Administered 2013-03-18: 4000 [IU] via INTRAVENOUS
  Filled 2013-03-18: qty 4000

## 2013-03-18 MED ORDER — SIMVASTATIN 40 MG PO TABS
40.0000 mg | ORAL_TABLET | Freq: Every day | ORAL | Status: DC
  Administered 2013-03-18 – 2013-03-24 (×7): 40 mg via ORAL
  Filled 2013-03-18 (×8): qty 1

## 2013-03-18 MED ORDER — LISINOPRIL 2.5 MG PO TABS
2.5000 mg | ORAL_TABLET | Freq: Every day | ORAL | Status: DC
  Administered 2013-03-18: 2.5 mg via ORAL
  Filled 2013-03-18 (×2): qty 1

## 2013-03-18 MED ORDER — SODIUM CHLORIDE 0.9 % IV SOLN
250.0000 mL | INTRAVENOUS | Status: DC | PRN
  Administered 2013-03-19: 250 mL via INTRAVENOUS

## 2013-03-18 MED ORDER — ACETAMINOPHEN 325 MG PO TABS: 650.0000 mg | ORAL_TABLET | ORAL | Status: DC | PRN

## 2013-03-18 MED ORDER — HEPARIN BOLUS VIA INFUSION
2500.0000 [IU] | Freq: Once | INTRAVENOUS | Status: AC
  Administered 2013-03-18: 2500 [IU] via INTRAVENOUS
  Filled 2013-03-18: qty 2500

## 2013-03-18 NOTE — Progress Notes (Signed)
ANTICOAGULATION CONSULT NOTE - Initial Consult  Pharmacy Consult for heparin Indication: chest pain/ACS  No Known Allergies  Patient Measurements: Height: 6\' 1"  (185.4 cm) Weight: 189 lb (85.73 kg) IBW/kg (Calculated) : 79.9 Heparin Dosing Weight: 80 kg  Vital Signs: Temp: 98.2 F (36.8 C) (12/03 1018) Temp src: Oral (12/03 1018) BP: 105/76 mmHg (12/03 1018) Pulse Rate: 109 (12/03 1018)  Labs:  Recent Labs  03/18/13 1019  HGB 11.9*  HCT 34.8*  PLT 246    Estimated Creatinine Clearance: 119 ml/min (by C-G formula based on Cr of 0.83).   Medical History: Past Medical History  Diagnosis Date  . Coronary artery disease     a. [place] in 2009 [place] with stenting of the LCX and LAD;  b. 12/2012 NSTEMI/CAD: LM nl, LAD patent prox stent, LCX 50-70 isr (FFR 0.84), RCA dom, 142m, EF 40-45%, sev basal to mid inf HK to AK-->Med Rx.  . Tobacco abuse     a. quit 12/2012.  . Ischemic cardiomyopathy     a. 12/2012 Ech: EF 40-45%, Gr 1 DD.  Marland Kitchen Hyperlipidemia   . Pericardial effusion   . CVA (cerebral infarction)   . 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 Stroke     Medications:  See med rec  Assessment: 52 yo man to start heparin for ACS.  He was transferred from cardiology office.  Hg 11.9, PTLC 246  He was on ASA and plavix but no anticoagulation PTA.  Goal of Therapy:  Heparin level 0.3-0.7 units/ml Monitor platelets by anticoagulation protocol: Yes   Plan:  Heparin bolus 4000 units and drip at 1150 units/hr Check heparin level 6 hours after bolus. Daily heparin level and CBC while on heparin.  Ornella Coderre Poteet 03/18/2013,10:59 AM

## 2013-03-18 NOTE — Telephone Encounter (Signed)
Advised nurse patient sent back to hospital by Dawayne Patricia NP today.

## 2013-03-18 NOTE — ED Notes (Signed)
Pt from cardiologist office via [facility].  Staff reported O2 sat of 85% on RA.  Upon arrival EMS had 94% RA.  Denied shortness of breath or chest pain at this time.  Pt in NAD, A&O.

## 2013-03-18 NOTE — Progress Notes (Signed)
[facility]  Patient Status: Active (receiving services up to time of hospitalization)  [facility] is providing the following services: RN, PT and OT  If patient discharges after hours, please call [phone].   [name] 03/18/2013, 5:52 PM

## 2013-03-18 NOTE — Progress Notes (Addendum)
Darryl Lent Date of Birth: 1960-07-12 Medical Record #[id number]  History of Present Illness: [name] is seen back today for a post hospital visit. Seen for Dr. Patty Sermons. He is a 52 year old male with known CAD - prior stents to the LAD and LCX in [place] back in 2007. Other issues included tobacco abuse and HLD.   In September of this year, he presented with chest pain - was cathed - showing a totally occluded RCA and a 50% ISR of the LCX stent. Managed medically. Readmitted with recurrent chest pain and anterolateral ST elevation in early October. Echo showed large effusion with tamponade. Pericardial drain was placed. He was almost ready for discharge when he had marked deterioration with left arm weakness, left visual field defect and then hemodynamic collapse with hypotension and tachycardia. Spiked fever of 104.5. Required intubation and high levels of PEEP. Repeat echo showed complex effusion with RV collapse and tamponade. He responded to volume resuscitation and antibiotics - presumed purulent pericarditis - went on to have emergent median sternotomy, repair of ruptured inferoposterior left ventricular pseudoaneurysm with CorMatrix patch and CABG x 1 with SVG to the OM1.  Discharged to CIR - discharged to home on October 31st. Seen by Dr. Dorris Fetch about 2 weeks ago. Referred back here for continuing cardiology care. Does not look like he has had repeat echo. Home health called earlier this week to ask for a swallowing evaluation - he has NO PCP.   Comes in today. Here with his daughter. Says he had done pretty well up until one week ago - started having trouble swallowing. Some chills. More short of breath. Home health noted that his sats were dropping. Home health requested swallowing study. He is coughing. No chest pain. Some palpitations. Clearly has deteriorated over the past one week.   Current Outpatient Prescriptions  Medication Sig Dispense Refill  . aspirin EC 325 MG  EC tablet Take 1 tablet (325 mg total) by mouth daily.  30 tablet  0  . clopidogrel (PLAVIX) 75 MG tablet Take 1 tablet (75 mg total) by mouth daily.  30 tablet  11  . famotidine (PEPCID) 20 MG tablet Take 1 tablet (20 mg total) by mouth 2 (two) times daily.  60 tablet  1  . furosemide (LASIX) 20 MG tablet Take 1 tablet (20 mg total) by mouth 2 (two) times daily.  60 tablet  1  . lisinopril (PRINIVIL,ZESTRIL) 5 MG tablet Take 0.5 tablets (2.5 mg total) by mouth daily.  30 tablet  11  . metoprolol succinate (TOPROL-XL) 25 MG 24 hr tablet Take 3 tablets (75 mg total) by mouth daily.  90 tablet  1  . potassium chloride SA (K-DUR,KLOR-CON) 20 MEQ tablet Take 1 tablet (20 mEq total) by mouth 3 (three) times daily.  90 tablet  1  . simvastatin (ZOCOR) 40 MG tablet Take 1 tablet (40 mg total) by mouth at bedtime.  30 tablet  11   No current facility-administered medications for this visit.    No Known Allergies  Past Medical History  Diagnosis Date  . Coronary artery disease     a. [place] with stenting of the LCX and LAD;  b. 12/2012 NSTEMI/CAD: LM nl, LAD patent prox stent, LCX 50-70 isr (FFR 0.84), RCA dom, 135m, EF 40-45%, sev basal to mid inf HK to AK-->Med Rx.  . Tobacco abuse     a. quit 12/2012.  . Ischemic cardiomyopathy  a. 12/2012 Ech: EF 40-45%, Gr 1 DD.  Marland Kitchen Hyperlipidemia   . Pericardial effusion   . CVA (cerebral infarction)   . Pseudoaneurysm     of the RV wall - s/p repair with CABG x 1    Past Surgical History  Procedure Laterality Date  . Coronary angioplasty with stent placement  2000's X 2    "1 + 1" (01/06/2013)  . Cardiac catheterization  01/06/2013  . Coronary artery bypass graft N/A 01/28/2013    Procedure: CORONARY ARTERY BYPASS GRAFTING (CABG);  Surgeon: Loreli Slot, MD;  Location[facility] OR;  Service: Open Heart Surgery;  Laterality: N/A;  CABG x one, using left greater saphenous vein harvested endoscopically  . Intraoperative transesophageal  echocardiogram N/A 01/28/2013    Procedure: INTRAOPERATIVE TRANSESOPHAGEAL ECHOCARDIOGRAM;  Surgeon: Loreli Slot, MD;  Location: [facility] OR;  Service: Open Heart Surgery;  Laterality: N/A;  . Ventricular aneurysm resection N/A 01/28/2013    Procedure: LEFT VENTRICULAR ANEURYSM REPAIR;  Surgeon: Loreli Slot, MD;  Location: [facility] OR;  Service: Open Heart Surgery;  Laterality: N/A;    History  Smoking status  . Former Smoker -- 1.00 packs/day for 35 years  . Types: Cigarettes  . Quit date: 01/06/2013  Smokeless tobacco  . Never Used    History  Alcohol Use No    Family History  Problem Relation Age of Onset  . CAD      Review of Systems: The review of systems is per the HPI.  All other systems were reviewed and are negative.  Physical Exam: BP 100/60  Pulse 126  Ht 6\' 1"  (1.854 m)  Wt 189 lb (85.73 kg)  BMI 24.94 kg/m2 Patient is very pleasant and in mild distress. He was not able to walk in here due to shortness of breath. He is short of breath with just talking. Had to be put in a wheelchair while here. Skin is very warm and dry. Color is normal.  HEENT is unremarkable. Normocephalic/atraumatic. PERRL. Sclera are nonicteric. Neck is supple. No masses. No JVD. Lungs are clear. Cardiac exam shows a regular rate and rhythm. He is tachycardic. No rub that I can appreciate. Abdomen is soft. Extremities are without edema. Gait not tested. No gross neurologic deficits noted but with some left arm weakness.  LABORATORY DATA:  EKG shows probable sinus tach - rate of 120 - tracing was reviewed with Dr. Excell Seltzer - he did not feel this was an acute MI   Lab Results  Component Value Date   WBC 6.5 02/13/2013   HGB 10.5* 02/13/2013   HCT 30.8* 02/13/2013   PLT 468* 02/13/2013   GLUCOSE 106* 02/13/2013   CHOL 164 01/06/2013   TRIG 109 01/06/2013   HDL 44 01/06/2013   LDLCALC 98 01/06/2013   ALT 31 02/05/2013   AST 42* 02/05/2013   NA 129* 02/13/2013   K 3.3* 02/13/2013   CL  89* 02/13/2013   CREATININE 0.83 02/13/2013   BUN 11 02/13/2013   CO2 23 02/13/2013   TSH 0.604 01/05/2013   INR 1.79* 01/28/2013   HGBA1C 5.7* 01/05/2013     Assessment / Plan: 1. S/P prior MI, complicated by stroke, pericarditis, subsequent purulent effusion with hemodynamic collapse - s/p CABG x 1 and repair of the pseudoaneurysm - he is not doing well. BP is low. He is tachycardic and hypoxic. Will need to readmit - will be sending to the ER. Needs labs. Needs CXR and echo repeated. Needs oxygen and  work up for why he is hypoxic.   2. Swallowing disorder - will address while admitted.   3. CAD - remote stents - no active chest pain.   4. CVA - still with left arm weakness - actually back walking - but too hypoxic today and had to be put in a wheelchair.  Further disposition to follow.   Patient subsequently seen with Dr. Excell Seltzer (DOD) - who is in agreement. EMS is called. The ER at [facility] is alerted. Biomedical scientist has been called.   Patient is agreeable to this plan and will call if any problems develop in the interim.   Rosalio Macadamia, RN, ANP-C [facility] [address] [place], [place]  [address]

## 2013-03-18 NOTE — Telephone Encounter (Signed)
Patient seen by Dawayne Patricia NP and being admitted today

## 2013-03-18 NOTE — Telephone Encounter (Signed)
I saw him in ER

## 2013-03-18 NOTE — Progress Notes (Addendum)
ANTICOAGULATION CONSULT NOTE - Follow Up Consult  Pharmacy Consult for heparin Indication: chest pain/ACS  No Known Allergies  Patient Measurements: Height: 6\' 1"  (185.4 cm) Weight: 189 lb (85.73 kg) IBW/kg (Calculated) : 79.9 Heparin Dosing Weight: 80 kg  Vital Signs: Temp: 98.7 F (37.1 C) (12/03 1745) Temp src: Oral (12/03 1745) BP: 115/87 mmHg (12/03 2000) Pulse Rate: 111 (12/03 2000)  Labs:  Recent Labs  03/18/13 1017 03/18/13 1019 03/18/13 1900 03/18/13 2055  HGB  --  11.9*  --   --   HCT  --  34.8*  --   --   PLT  --  246  --   --   LABPROT  --  15.2  --   --   INR  --  1.23  --   --   HEPARINUNFRC <0.10*  --   --  <0.10*  CREATININE  --  0.68  --   --   TROPONINI  --   --  1.94*  --     Estimated Creatinine Clearance: 123.5 ml/min (by C-G formula based on Cr of 0.68).   Medications:  Heparin at 1150 units/hr  Assessment: 52 YOM continues on heparin for ACS. Initial heparin level undetectable. Per RN, no issues with line and drip has not been held. No bleeding noted. Hgb slightly low, platelets WNL.  Goal of Therapy:  Heparin level 0.3-0.7 units/ml Monitor platelets by anticoagulation protocol: Yes   Plan:  1. Re-bolus with 2500 units heparin IV x1 2. Increase heparin drip rate to 1400 units/hr 3. Check heparin level at 0400 4. Continue daily heparin level and CBC  Ethylene Reznick D. Denasia Venn, PharmD, BCPS Clinical Pharmacist Pager: [phone] 03/18/2013 10:14 PM

## 2013-03-18 NOTE — ED Notes (Signed)
NOTIFIED DR. LOCKWOOD IN PERSON OF PATIENTS LAB RESULTS OF I STAT TROPONIN = 1.35ng/ml @11 :00AM, 03/18/2013.

## 2013-03-18 NOTE — H&P (Signed)
[name] Date of Birth: [birth year] Medical Record #[id number]  History of Present Illness: [name] is seen back today for a post hospital visit. Seen for Dr. Patty Sermons. He is a 52 year old male with known CAD - prior stents to the LAD and LCX in [place] back in 2007. Other issues included tobacco abuse and HLD.   In September of this year, he presented with chest pain - was cathed - showing a totally occluded RCA and a 50% ISR of the LCX stent. Managed medically. Readmitted with recurrent chest pain and anterolateral ST elevation in early October. Echo showed large effusion with tamponade. Pericardial drain was placed. He was almost ready for discharge when he had marked deterioration with left arm weakness, left visual field defect and then hemodynamic collapse with hypotension and tachycardia. Spiked fever of 104.5. Required intubation and high levels of PEEP. Repeat echo showed complex effusion with RV collapse and tamponade. He responded to volume resuscitation and antibiotics - presumed purulent pericarditis - went on to have emergent median sternotomy, repair of ruptured inferoposterior left ventricular pseudoaneurysm with CorMatrix patch and CABG x 1 with SVG to the OM1.  Discharged to CIR - discharged to home on October 31st. Seen by Dr. Dorris Fetch about 2 weeks ago. Referred back here for continuing cardiology care. Does not look like he has had repeat echo. Home health called earlier this week to ask for a swallowing evaluation - he has NO PCP.   Comes in today. Here with his daughter. Says he had done pretty well up until one week ago - started having trouble swallowing. Some chills. More short of breath. Home health noted that his sats were dropping. Home health requested swallowing study. He is coughing. No chest pain. Some palpitations. Clearly has deteriorated over the past one week.      Current Outpatient Prescriptions   Medication  Sig  Dispense  Refill   .   aspirin EC 325 MG EC tablet  Take 1 tablet (325 mg total) by mouth daily.   30 tablet   0   .  clopidogrel (PLAVIX) 75 MG tablet  Take 1 tablet (75 mg total) by mouth daily.   30 tablet   11   .  famotidine (PEPCID) 20 MG tablet  Take 1 tablet (20 mg total) by mouth 2 (two) times daily.   60 tablet   1   .  furosemide (LASIX) 20 MG tablet  Take 1 tablet (20 mg total) by mouth 2 (two) times daily.   60 tablet   1   .  lisinopril (PRINIVIL,ZESTRIL) 5 MG tablet  Take 0.5 tablets (2.5 mg total) by mouth daily.   30 tablet   11   .  metoprolol succinate (TOPROL-XL) 25 MG 24 hr tablet  Take 3 tablets (75 mg total) by mouth daily.   90 tablet   1   .  potassium chloride SA (K-DUR,KLOR-CON) 20 MEQ tablet  Take 1 tablet (20 mEq total) by mouth 3 (three) times daily.   90 tablet   1   .  simvastatin (ZOCOR) 40 MG tablet  Take 1 tablet (40 mg total) by mouth at bedtime.   30 tablet   11      No current facility-administered medications for this visit.    No Known Allergies    Past Medical History   Diagnosis  Date   .  Coronary artery disease         a.  s/p MI in 2009 [place] with stenting of the LCX and LAD;  b. 12/2012 NSTEMI/CAD: LM nl, LAD patent prox stent, LCX 50-70 isr (FFR 0.84), RCA dom, 169m, EF 40-45%, sev basal to mid inf HK to AK-->Med Rx.   .  Tobacco abuse         a. quit 12/2012.   .  Ischemic cardiomyopathy         a. 12/2012 Ech: EF 40-45%, Gr 1 DD.   Marland Kitchen  Hyperlipidemia     .  Pericardial effusion     .  CVA (cerebral infarction)     .  Pseudoaneurysm         of the RV wall - s/p repair with CABG x 1      Past Surgical History   Procedure  Laterality  Date   .  Coronary angioplasty with stent placement    2000's X 2       "1 + 1" (01/06/2013)   .  Cardiac catheterization    01/06/2013   .  Coronary artery bypass graft  N/A  01/28/2013       Procedure: CORONARY ARTERY BYPASS GRAFTING (CABG);  Surgeon: Loreli Slot, MD;  Location: [facility] OR;  Service: Open Heart Surgery;   Laterality: N/A;  CABG x one, using left greater saphenous vein harvested endoscopically   .  Intraoperative transesophageal echocardiogram  N/A  01/28/2013       Procedure: INTRAOPERATIVE TRANSESOPHAGEAL ECHOCARDIOGRAM;  Surgeon: Loreli Slot, MD;  Location: [facility] OR;  Service: Open Heart Surgery;  Laterality: N/A;   .  Ventricular aneurysm resection  N/A  01/28/2013       Procedure: LEFT VENTRICULAR ANEURYSM REPAIR;  Surgeon: Loreli Slot, MD;  Location: [facility] OR;  Service: Open Heart Surgery;  Laterality: N/A;      History   Smoking status   .  Former Smoker -- 1.00 packs/day for 35 years   .  Types:  Cigarettes   .  Quit date:  01/06/2013   Smokeless tobacco   .  Never Used      History   Alcohol Use  No      Family History   Problem  Relation  Age of Onset   .  CAD        Review of Systems: The review of systems is per the HPI.  All other systems were reviewed and are negative.  Physical Exam: BP 100/60  Pulse 126  Ht 6\' 1"  (1.854 m)  Wt 189 lb (85.73 kg)  BMI 24.94 kg/m2 Patient is very pleasant and in mild distress. He was not able to walk in here due to shortness of breath. He is short of breath with just talking. Had to be put in a wheelchair while here. Skin is very warm and dry. Color is normal.  HEENT is unremarkable. Normocephalic/atraumatic. PERRL. Sclera are nonicteric. Neck is supple. No masses. No JVD. Lungs are clear. Cardiac exam shows a regular rate and rhythm. He is tachycardic. No rub that I can appreciate. Abdomen is soft. Extremities are without edema. Gait not tested. No gross neurologic deficits noted but with some left arm weakness.  LABORATORY DATA:  EKG shows probable sinus tach - rate of 120 - tracing was reviewed with Dr. Excell Seltzer - he did not feel this was an acute MI     Lab Results   Component  Value  Date     WBC  6.5  02/13/2013  HGB  10.5*  02/13/2013     HCT  30.8*  02/13/2013     PLT  468*  02/13/2013     GLUCOSE  106*   02/13/2013     CHOL  164  01/06/2013     TRIG  109  01/06/2013     HDL  44  01/06/2013     LDLCALC  98  01/06/2013     ALT  31  02/05/2013     AST  42*  02/05/2013     NA  129*  02/13/2013     K  3.3*  02/13/2013     CL  89*  02/13/2013     CREATININE  0.83  02/13/2013     BUN  11  02/13/2013     CO2  23  02/13/2013     TSH  0.604  01/05/2013     INR  1.79*  01/28/2013     HGBA1C  5.7*  01/05/2013    Assessment / Plan: 1. S/P prior MI, complicated by stroke, pericarditis, subsequent purulent effusion with hemodynamic collapse - s/p CABG x 1 and repair of the pseudoaneurysm - he is not doing well. BP is low. He is tachycardic and hypoxic. Will need to readmit - will be sending to the ER. Needs labs. Needs CXR and echo repeated. Needs oxygen and work up for why he is hypoxic.   2. Swallowing disorder - will address while admitted.   3. CAD - remote stents - no active chest pain.   4. CVA - still with left arm weakness - actually back walking - but too hypoxic today and had to be put in a wheelchair.  Further disposition to follow.   Patient subsequently seen with Dr. Excell Seltzer (DOD) - who is in agreement. EMS is called. The ER at [facility] is alerted. Biomedical scientist has been called.   Patient is agreeable to this plan and will call if any problems develop in the interim.   Rosalio Macadamia, RN, ANP-C [facility] [address] [place], [place]  [address]   Patient seen in ER.  He is having mild intermittent pleuritic anterior chest pain. Pain is mild and worse with deep breath. No sputum production. No fever.  WBC is normal. Home health nurse has been noting low oxygen saturations for the past week. For the past week patient has had problems swallowing food. He has also noted symptoms of reflux. Exam of mouth does not reveal any thrush. Lungs are clear except for decrease breath sounds at left base. Heart reveals no pericardial rub. Patient is not dyspneic  lying flat.  No phlebitis or edema. His troponin is elevated. His EKG shows NSR with persistent ST elevation in inferior leads. Plan: 1. Serial EKG and troponins 2. IV heparin shortterm. 3. 2D echo 4. CT angio of chest to rule out pulmonary emboli as cause of his hypoxemia. Chest xray shows atelectasis or infiltrate at left base. There is cardiomegaly. 5. Speech therapy consult to evaluate swallowing difficulty. 6. Continue physical therapy for his prior stroke with left sided weakness.

## 2013-03-18 NOTE — Patient Instructions (Signed)
We are going to admit you to the hospital.  

## 2013-03-18 NOTE — H&P (Signed)
Patient ID: [name] MRN: [id number], DOB/AGE: 51-02-1961   Admit date: 03/18/2013   Primary Physician: Jeanann Lewandowsky, MD Primary Cardiologist: Cassell Clement M.D.  Pt. Profile:  This 52 year old gentleman who was sent to the emergency room from the [address] office earlier today  Problem List  Past Medical History  Diagnosis Date  . Coronary artery disease     a. s/p MI in 2009 in [place] with stenting of the LCX and LAD;  b. 12/2012 NSTEMI/CAD: LM nl, LAD patent prox stent, LCX 50-70 isr (FFR 0.84), RCA dom, 135m, EF 40-45%, sev basal to mid inf HK to AK-->Med Rx.  . Tobacco abuse     a. quit 12/2012.  . Ischemic cardiomyopathy     a. 12/2012 Ech: EF 40-45%, Gr 1 DD.  Marland Kitchen Hyperlipidemia   . Pericardial effusion   . CVA (cerebral infarction)   . 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 Stroke     Past Surgical History  Procedure Laterality Date  . Coronary angioplasty with stent placement  2000's X 2    "1 + 1" (01/06/2013)  . Cardiac catheterization  01/06/2013  . Coronary artery bypass graft N/A 01/28/2013    Procedure: CORONARY ARTERY BYPASS GRAFTING (CABG);  Surgeon: Loreli Slot, MD;  Location: [facility] OR;  Service: Open Heart Surgery;  Laterality: N/A;  CABG x one, using left greater saphenous vein harvested endoscopically  . Intraoperative transesophageal echocardiogram N/A 01/28/2013    Procedure: INTRAOPERATIVE TRANSESOPHAGEAL ECHOCARDIOGRAM;  Surgeon: Loreli Slot, MD;  Location: [facility] OR;  Service: Open Heart Surgery;  Laterality: N/A;  . Ventricular aneurysm resection N/A 01/28/2013    Procedure: LEFT VENTRICULAR ANEURYSM REPAIR;  Surgeon: Loreli Slot, MD;  Location: [facility] OR;  Service: Open Heart Surgery;  Laterality: N/A;     Allergies  No Known Allergies  HPI    Home Medications  Prior to Admission medications   Medication Sig Start Date End Date Taking? Authorizing Provider  aspirin EC 325 MG EC  tablet Take 1 tablet (325 mg total) by mouth daily. 02/04/13   Donielle Margaretann Loveless, PA-C  clopidogrel (PLAVIX) 75 MG tablet Take 1 tablet (75 mg total) by mouth daily. 01/07/13   Rhonda G Barrett, PA-C  famotidine (PEPCID) 20 MG tablet Take 1 tablet (20 mg total) by mouth 2 (two) times daily. 02/13/13   Jacquelynn Cree, PA-C  furosemide (LASIX) 20 MG tablet Take 1 tablet (20 mg total) by mouth 2 (two) times daily. 02/13/13   Evlyn Kanner Love, PA-C  lisinopril (PRINIVIL,ZESTRIL) 5 MG tablet Take 0.5 tablets (2.5 mg total) by mouth daily. 02/13/13   Jacquelynn Cree, PA-C  metoprolol succinate (TOPROL-XL) 25 MG 24 hr tablet Take 3 tablets (75 mg total) by mouth daily. 02/13/13   Evlyn Kanner Love, PA-C  potassium chloride SA (K-DUR,KLOR-CON) 20 MEQ tablet Take 1 tablet (20 mEq total) by mouth 3 (three) times daily. 02/13/13   Jacquelynn Cree, PA-C  simvastatin (ZOCOR) 40 MG tablet Take 1 tablet (40 mg total) by mouth at bedtime. 01/07/13   Joline Salt Barrett, PA-C    Family History  Family History  Problem Relation Age of Onset  . CAD      Social History  History   Social History  . Marital Status: Single    Spouse Name: N/A    Number of Children: N/A  . Years of Education: N/A  Occupational History  . Not on file.   Social History Main Topics  . Smoking status: Former Smoker -- 1.00 packs/day for 35 years    Types: Cigarettes    Quit date: 01/06/2013  . Smokeless tobacco: Never Used  . Alcohol Use: No  . Drug Use: No  . Sexual Activity: Yes   Other Topics Concern  . Not on file   Social History Narrative   Lives in [place].  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  Quit smoking after MI 12/2012.     Review of Systems General:  No chills, fever, night sweats or weight changes.  Cardiovascular:  No chest pain, dyspnea on exertion, edema, orthopnea, palpitations, paroxysmal nocturnal dyspnea. Dermatological: No rash, lesions/masses Respiratory: No cough, dyspnea Urologic: No  hematuria, dysuria Abdominal:   No nausea, vomiting, diarrhea, bright red blood per rectum, melena, or hematemesis Neurologic:  No visual changes, wkns, changes in mental status. All other systems reviewed and are otherwise negative except as noted above.  Physical Exam  Blood pressure 105/76, pulse 109, temperature 98.2 F (36.8 C), temperature source Oral, resp. rate 18, height 6\' 1"  (1.854 m), weight 189 lb (85.73 kg), SpO2 98.00%.  General: Pleasant, NAD Psych: Normal affect. Neuro: Alert and oriented X 3. Moves all extremities spontaneously. HEENT: Normal  Neck: Supple without bruits or JVD. Lungs:  Resp regular and unlabored, CTA. Heart: RRR no s3, s4, or murmurs. Abdomen: Soft, non-tender, non-distended, BS + x 4.  Extremities: No clubbing, cyanosis or edema. DP/PT/Radials 2+ and equal bilaterally.  Labs  Troponin [facility] Test)  Recent Labs  03/18/13 1039  TROPIPOC 1.35*   No results found for this basename: CKTOTAL, CKMB, TROPONINI,  in the last 72 hours Lab Results  Component Value Date   WBC 9.5 03/18/2013   HGB 11.9* 03/18/2013   HCT 34.8* 03/18/2013   MCV 79.6 03/18/2013   PLT 246 03/18/2013     Recent Labs Lab 03/18/13 1019  NA 130*  K 3.6  CL 93*  CO2 19  BUN 7  CREATININE 0.68  CALCIUM 9.2  PROT 7.9  BILITOT 0.5  ALKPHOS 147*  ALT 13  AST 32  GLUCOSE 110*   Lab Results  Component Value Date   CHOL 164 01/06/2013   HDL 44 01/06/2013   LDLCALC 98 01/06/2013   TRIG 109 01/06/2013   Lab Results  Component Value Date   DDIMER 3.06* 01/28/2013     Radiology/Studies  Dg Chest 2 View  03/03/2013   CLINICAL DATA:  52 year old male with chest soreness. Left upper extremity weakness. Initial encounter. Recent CABG and stroke.  EXAM: CHEST  2 VIEW  COMPARISON:  02/04/2013 and earlier.  FINDINGS: Sequelae of median sternotomy. Right IJ central line removed. Lung volumes a. Baseline. No pneumothorax, pulmonary edema, pleural effusion or  consolidation. Mild linear scarring or atelectasis at the left lung base. Visualized tracheal air column is within normal limits. Stable visualized osseous structures.  IMPRESSION: Minor left lung base atelectasis or scarring. No acute cardiopulmonary abnormality.   Electronically Signed   By: Augusto Gamble M.D.   On: 03/03/2013 10:18   Dg Chest Port 1 View  03/18/2013   CLINICAL DATA:  Short of breath  EXAM: PORTABLE CHEST - 1 VIEW  COMPARISON:  03/03/2013  FINDINGS: Cardiac enlargement.  Pulmonary vascularity is normal.  Mild bibasilar atelectasis/infiltrate, increased from the prior study. Negative for effusion. Prior median sternotomy.  IMPRESSION: Mild bibasilar atelectasis/ infiltrate.  Negative  for heart failure.   Electronically Signed   By: Marlan Palau M.D.   On: 03/18/2013 11:21    ECG    ASSESSMENT AND PLAN     Signed, Cassell Clement, MD  03/18/2013, 11:39 AM

## 2013-03-18 NOTE — ED Provider Notes (Signed)
CSN: [id number]     Arrival date & time 03/18/13  1008 History   First MD Initiated Contact with Patient 03/18/13 1015     Chief Complaint  Patient presents with  . Shortness of Breath   (Consider location/radiation/quality/duration/timing/severity/associated sxs/prior Treatment) HPI Patient presents from the cardiology clinic.  Per report the patient had hypoxia there, with oxygen saturation in the mid-80s this improved with supplemental oxygen. The patient himself states that over the past week he has had increasing dyspnea, without chest pain.  Symptoms have progressed without clear precipitant.  No clear alleviating or exacerbating factors.  There is no pleuritic pain associated with the dyspnea.  There is no new lightheadedness.  There is generalized sense of discomfort and fatigue.  No new swelling anywhere. The patient has a notable history of bypass surgery, is currently taking Plavix, aspirin. Per EMS the patient had no notable changes in route.    Past Medical History  Diagnosis Date  . Coronary artery disease     a. s/p MI in 2009 in [place] with stenting of the LCX and LAD;  b. 12/2012 NSTEMI/CAD: LM nl, LAD patent prox stent, LCX 50-70 isr (FFR 0.84), RCA dom, 111m, EF 40-45%, sev basal to mid inf HK to AK-->Med Rx.  . Tobacco abuse     a. quit 12/2012.  . Ischemic cardiomyopathy     a. 12/2012 Ech: EF 40-45%, Gr 1 DD.  Marland Kitchen Hyperlipidemia   . Pericardial effusion   . CVA (cerebral infarction)   . 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 Stroke    Past Surgical History  Procedure Laterality Date  . Coronary angioplasty with stent placement  2000's X 2    "1 + 1" (01/06/2013)  . Cardiac catheterization  01/06/2013  . Coronary artery bypass graft N/A 01/28/2013    Procedure: CORONARY ARTERY BYPASS GRAFTING (CABG);  Surgeon: Loreli Slot, MD;  Location: [facility] OR;  Service: Open Heart Surgery;  Laterality: N/A;  CABG x one, using left greater saphenous  vein harvested endoscopically  . Intraoperative transesophageal echocardiogram N/A 01/28/2013    Procedure: INTRAOPERATIVE TRANSESOPHAGEAL ECHOCARDIOGRAM;  Surgeon: Loreli Slot, MD;  Location: [facility] OR;  Service: Open Heart Surgery;  Laterality: N/A;  . Ventricular aneurysm resection N/A 01/28/2013    Procedure: LEFT VENTRICULAR ANEURYSM REPAIR;  Surgeon: Loreli Slot, MD;  Location: [facility] OR;  Service: Open Heart Surgery;  Laterality: N/A;   Family History  Problem Relation Age of Onset  . CAD     History  Substance Use Topics  . Smoking status: Former Smoker -- 1.00 packs/day for 35 years    Types: Cigarettes    Quit date: 01/06/2013  . Smokeless tobacco: Never Used  . Alcohol Use: No    Review of Systems  Constitutional:       Per HPI, otherwise negative  HENT:       Per HPI, otherwise negative  Respiratory:       Per HPI, otherwise negative  Cardiovascular:       Per HPI, otherwise negative  Gastrointestinal: Negative for vomiting.  Endocrine:       Negative aside from HPI  Genitourinary:       Neg aside from HPI   Musculoskeletal:       Per HPI, otherwise negative  Skin: Negative.   Neurological: Negative for syncope.    Allergies  Review of patient's allergies indicates no known allergies.  Home Medications   Current Outpatient Rx  Name  Route  Sig  Dispense  Refill  . aspirin EC 325 MG EC tablet   Oral   Take 1 tablet (325 mg total) by mouth daily.   30 tablet   0   . clopidogrel (PLAVIX) 75 MG tablet   Oral   Take 1 tablet (75 mg total) by mouth daily.   30 tablet   11   . famotidine (PEPCID) 20 MG tablet   Oral   Take 1 tablet (20 mg total) by mouth 2 (two) times daily.   60 tablet   1   . furosemide (LASIX) 20 MG tablet   Oral   Take 1 tablet (20 mg total) by mouth 2 (two) times daily.   60 tablet   1   . lisinopril (PRINIVIL,ZESTRIL) 5 MG tablet   Oral   Take 0.5 tablets (2.5 mg total) by mouth daily.   30 tablet   11    . metoprolol succinate (TOPROL-XL) 25 MG 24 hr tablet   Oral   Take 3 tablets (75 mg total) by mouth daily.   90 tablet   1   . potassium chloride SA (K-DUR,KLOR-CON) 20 MEQ tablet   Oral   Take 1 tablet (20 mEq total) by mouth 3 (three) times daily.   90 tablet   1   . simvastatin (ZOCOR) 40 MG tablet   Oral   Take 1 tablet (40 mg total) by mouth at bedtime.   30 tablet   11    BP 105/76  Pulse 109  Temp(Src) 98.2 F (36.8 C) (Oral)  Resp 18  Ht 6\' 1"  (1.854 m)  Wt 189 lb (85.73 kg)  BMI 24.94 kg/m2  SpO2 98% Physical Exam  Nursing note and vitals reviewed. Constitutional: He is oriented to person, place, and time. He appears well-developed. No distress.  HENT:  Head: Normocephalic and atraumatic.  Eyes: Conjunctivae and EOM are normal.  Cardiovascular: Normal rate and regular rhythm.   Pulmonary/Chest: Effort normal. No stridor. No respiratory distress. He has decreased breath sounds.  Abdominal: Soft. Normal appearance. He exhibits no distension.  Musculoskeletal:  No gross deformity  Neurological: He is alert and oriented to person, place, and time.  Skin: Skin is warm and dry.  Psychiatric: He has a normal mood and affect.   Prior to arrival I discussed the patient's case with EMS, staff from the patient's cardiologist office.  Patient had ST changes there were abnormal but not as pronounced as on prior stenting. ED Course  Procedures (including critical care time) Labs Review Labs Reviewed  PRO B NATRIURETIC PEPTIDE - Abnormal; Notable for the following:    Pro B Natriuretic peptide (BNP) 2568.0 (*)    All other components within normal limits  CBC - Abnormal; Notable for the following:    Hemoglobin 11.9 (*)    HCT 34.8 (*)    All other components within normal limits  COMPREHENSIVE METABOLIC PANEL - Abnormal; Notable for the following:    Sodium 130 (*)    Chloride 93 (*)    Glucose, Bld 110 (*)    Albumin 3.1 (*)    Alkaline Phosphatase 147 (*)     All other components within normal limits  POCT I-STAT TROPONIN I - Abnormal; Notable for the following:    Troponin i, poc 1.35 (*)    All other components within normal limits  PROTIME-INR  HEPARIN LEVEL (UNFRACTIONATED)   Imaging Review Dg Chest  Port 1 View  03/18/2013   CLINICAL DATA:  Short of breath  EXAM: PORTABLE CHEST - 1 VIEW  COMPARISON:  03/03/2013  FINDINGS: Cardiac enlargement.  Pulmonary vascularity is normal.  Mild bibasilar atelectasis/infiltrate, increased from the prior study. Negative for effusion. Prior median sternotomy.  IMPRESSION: Mild bibasilar atelectasis/ infiltrate.  Negative for heart failure.   Electronically Signed   By: Marlan Palau M.D.   On: 03/18/2013 11:21    EKG Interpretation    Date/Time:  Wednesday March 18 2013 10:16:07 EST Ventricular Rate:  110 PR Interval:  131 QRS Duration: 107 QT Interval:  447 QTC Calculation: 605 R Axis:   119 Text Interpretation:  Sinus tachycardia Inferior infarct, old Abnormal lateral Q waves Abnrm T, consider ischemia, anterolateral lds Prolonged QT interval Sinus tachycardia ST-t wave abnormality QT prolonged Abnormal ekg Confirmed by Gerhard Munch  MD [id number]) on 03/18/2013 11:43:46 AM           Operative outpatient.  In a similar condition, mild dyspnea, no pain is elevated.  Started heparin.  I discussed his case with our cardiologist.  Update: Patient remains in similar condition.  Update: no notable changes on rhythm strip.   MDM   1. Chest pain   Elevated troponin  This patient presents with ongoing dyspnea.  Notably the patient's multiple medical problems, including CAD, with recent bypass surgery.  On evaluation at our affiliated facility the patient was found to be hypoxic, with nonspecific ST changes on his EKG.  On my initial evaluation the patient is dyspneic, though this improved with oxygen.  His benign pain, but his evaluation is most notable for elevated troponin, ongoing abnormal  EKG.  With the patient's known coronary disease, heparin was started with concern of unstable angina.  Patient required admission to the cardiology team for further evaluation and management  CRITICAL CARE Performed by: Gerhard Munch Total critical care time: 40 Critical care time was exclusive of separately billable procedures and treating other patients. Critical care was necessary to treat or prevent imminent or life-threatening deterioration. Critical care was time spent personally by me on the following activities: development of treatment plan with patient and/or surrogate as well as nursing, discussions with consultants, evaluation of patient's response to treatment, examination of patient, obtaining history from patient or surrogate, ordering and performing treatments and interventions, ordering and review of laboratory studies, ordering and review of radiographic studies, pulse oximetry and re-evaluation of patient's condition.    Gerhard Munch, MD 03/18/13 [id number]

## 2013-03-19 ENCOUNTER — Inpatient Hospital Stay (HOSPITAL_COMMUNITY): Payer: Medicaid Other

## 2013-03-19 DIAGNOSIS — I319 Disease of pericardium, unspecified: Secondary | ICD-10-CM

## 2013-03-19 DIAGNOSIS — I309 Acute pericarditis, unspecified: Secondary | ICD-10-CM

## 2013-03-19 LAB — TROPONIN I
Troponin I: 2.89 ng/mL (ref <0.30)
Troponin I: 3.27 ng/mL (ref <0.30)
Troponin I: 2.23 ng/mL (ref <0.30)

## 2013-03-19 LAB — BASIC METABOLIC PANEL
BUN: 7 mg/dL (ref 6–23)
Chloride: 96 mEq/L (ref 96–112)
GFR calc Af Amer: >90 mL/min (ref >90)
Glucose, Bld: 76 mg/dL (ref 70–99)
Potassium: 4 mEq/L (ref 3.5–5.1)
Sodium: 132 mEq/L — ABNORMAL LOW (ref 135–145)

## 2013-03-19 LAB — CBC
Hemoglobin: 12.1 g/dL — ABNORMAL LOW (ref 13.0–17.0)
MCH: 27.2 pg (ref 26.0–34.0)
RBC: 4.45 MIL/uL (ref 4.22–5.81)
RDW: 15.5 % (ref 11.5–15.5)
WBC: 14.6 10*3/uL — ABNORMAL HIGH (ref 4.0–10.5)

## 2013-03-19 LAB — HIGH SENSITIVITY CRP
CRP, High Sensitivity: >80 mg/L — ABNORMAL HIGH
CRP, High Sensitivity: >80 mg/L — ABNORMAL HIGH

## 2013-03-19 LAB — HEPARIN LEVEL (UNFRACTIONATED): Heparin Unfractionated: 0.12 IU/mL — ABNORMAL LOW (ref 0.30–0.70)

## 2013-03-19 LAB — LIPID PANEL
Cholesterol: 122 mg/dL (ref 0–200)
Triglycerides: 104 mg/dL (ref <150)

## 2013-03-19 LAB — SEDIMENTATION RATE: Sed Rate: 81 mm/hr — ABNORMAL HIGH (ref 0–16)

## 2013-03-19 MED ORDER — ASPIRIN EC 81 MG PO TBEC
81.0000 mg | DELAYED_RELEASE_TABLET | Freq: Every day | ORAL | Status: DC
  Administered 2013-03-19 – 2013-03-25 (×7): 81 mg via ORAL
  Filled 2013-03-19 (×7): qty 1

## 2013-03-19 MED ORDER — HEPARIN SODIUM (PORCINE) 5000 UNIT/ML IJ SOLN
5000.0000 [IU] | Freq: Three times a day (TID) | INTRAMUSCULAR | Status: DC
  Administered 2013-03-19 – 2013-03-25 (×18): 5000 [IU] via SUBCUTANEOUS
  Filled 2013-03-19 (×21): qty 1

## 2013-03-19 MED ORDER — HEPARIN BOLUS VIA INFUSION
2000.0000 [IU] | Freq: Once | INTRAVENOUS | Status: AC
  Administered 2013-03-19: 2000 [IU] via INTRAVENOUS
  Filled 2013-03-19: qty 2000

## 2013-03-19 NOTE — Progress Notes (Signed)
  Echocardiogram 2D Echocardiogram has been performed.  [name] 03/19/2013, 10:36 AM

## 2013-03-19 NOTE — Progress Notes (Signed)
Pt continues to have severe diarrhea. Improved confidence in swallowing. RN encouraging PT to participate independently with ADLs, feeding etc.

## 2013-03-19 NOTE — Progress Notes (Signed)
ANTICOAGULATION CONSULT NOTE  Pharmacy Consult for heparin Indication: chest pain/ACS  No Known Allergies  Patient Measurements: Height: 6\' 1"  (185.4 cm) Weight: 190 lb 11.2 oz (86.5 kg) IBW/kg (Calculated) : 79.9 Heparin Dosing Weight: 80 kg  Vital Signs: Temp: 98 F (36.7 C) (12/04 0500) Temp src: Oral (12/04 0500) BP: 102/70 mmHg (12/04 0600) Pulse Rate: 104 (12/04 0600)  Labs:  Recent Labs  03/18/13 1017 03/18/13 1019 03/18/13 1900 03/18/13 2055 03/18/13 2340 03/19/13 0323  HGB  --  11.9*  --   --   --  12.1*  HCT  --  34.8*  --   --   --  36.4*  PLT  --  246  --   --   --  266  LABPROT  --  15.2  --   --   --   --   INR  --  1.23  --   --   --   --   HEPARINUNFRC <0.10*  --   --  <0.10*  --  0.12*  CREATININE  --  0.68  --   --   --  0.69  TROPONINI  --   --  1.94*  --  3.83* 2.89*    Estimated Creatinine Clearance: 123.5 ml/min (by C-G formula based on Cr of 0.69).  Assessment: 52 y.o. male with NSTEMI for heparin   Goal of Therapy:  Heparin level 0.3-0.7 units/ml Monitor platelets by anticoagulation protocol: Yes   Plan:  Heparin 2000 units IV bolus, then increase heparin 1700 units/hr Check heparin level in 6 hours.  Geannie Risen, PharmD, BCPS  03/19/2013 6:44 AM

## 2013-03-19 NOTE — Procedures (Signed)
Objective Swallowing Evaluation: Modified Barium Swallowing Study  Patient Details  Name: [name] MRN: [id number] Date of Birth: [birth year]  Today's Date: 03/19/2013 Time: 1340-1415 SLP Time Calculation (min): 35 min  Past Medical History:  Past Medical History  Diagnosis Date  . Coronary artery disease     a. s/p MI in 2009 in [place] with stenting of the LCX and LAD;  b. 12/2012 NSTEMI/CAD: LM nl, LAD patent prox stent, LCX 50-70 isr (FFR 0.84), RCA dom, 174m, EF 40-45%, sev basal to mid inf HK to AK-->Med Rx.  . Tobacco abuse     a. quit 12/2012.  . Ischemic cardiomyopathy     a. 12/2012 Ech: EF 40-45%, Gr 1 DD.  Marland Kitchen Hyperlipidemia   . Pericardial effusion   . CVA (cerebral infarction)   . 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 Stroke   . Hypoxia 03/18/2013  . Myocardial infarction   . Shortness of breath    Past Surgical History:  Past Surgical History  Procedure Laterality Date  . Coronary angioplasty with stent placement  2000's X 2    "1 + 1" (01/06/2013)  . Cardiac catheterization  01/06/2013  . Coronary artery bypass graft N/A 01/28/2013    Procedure: CORONARY ARTERY BYPASS GRAFTING (CABG);  Surgeon: Loreli Slot, MD;  Location: [facility] OR;  Service: Open Heart Surgery;  Laterality: N/A;  CABG x one, using left greater saphenous vein harvested endoscopically  . Intraoperative transesophageal echocardiogram N/A 01/28/2013    Procedure: INTRAOPERATIVE TRANSESOPHAGEAL ECHOCARDIOGRAM;  Surgeon: Loreli Slot, MD;  Location: [facility] OR;  Service: Open Heart Surgery;  Laterality: N/A;  . Ventricular aneurysm resection N/A 01/28/2013    Procedure: LEFT VENTRICULAR ANEURYSM REPAIR;  Surgeon: Loreli Slot, MD;  Location: [facility] OR;  Service: Open Heart Surgery;  Laterality: N/A;   HPI:  Patient presents from the cardiology clinic.  Per report the patient had hypoxia there, with oxygen saturation in the mid-80s this improved with supplemental  oxygen.  Pt. exhibits gagging behaviors and spits bolus into emesis basin stating "I can't swallow."     Assessment / Plan / Recommendation Clinical Impression  Dysphagia Diagnosis: Within Functional Limits (Gagging behaviors inconsistent) Clinical impression: Pt. was able to swallow thin and nectar thick liquids, swallowed a 13mm barium tablet with thin liquid, and chewed and swallowed a graham cracker without difficulty.  However, he exhibited significant gagging behaviors (with pudding and purees) before swallowing, while the bolus was in the anterior portion of the oral cavity, then spat out the bolus into an emesis basin.  The pt. began coughing and bringing up thick, yellow mucous (noted there was no barium in the mucous).  The esophagus was completely clear after all boluses, and there was no aspiration, penetration, laryngeal or esophageal residue.  ? if the is psychosomatic vs. a lower GI issue. Pt. denies being under stress or having any emotional issues.    Treatment Recommendation  No treatment recommended at this time    Diet Recommendation Dysphagia 3 (Mechanical Soft);Thin liquid   Liquid Administration via: Cup;Straw Medication Administration: Whole meds with liquid Supervision: Patient able to self feed;Intermittent supervision to cue for compensatory strategies    Other  Recommendations Recommended Consults: Consider GI evaluation   Follow Up Recommendations  None    Frequency and Duration        Pertinent Vitals/Pain Less gagging with use of chin tuck.    SLP  Swallow Goals  n/a   General HPI: Patient presents from the cardiology clinic.  Per report the patient had hypoxia there, with oxygen saturation in the mid-80s this improved with supplemental oxygen.  Pt. exhibits gagging behaviors and spits bolus into emesis basin stating "I can't swallow." Type of Study: Modified Barium Swallowing Study Reason for Referral: Objectively evaluate swallowing function Previous  Swallow Assessment: BSE completed this am (see report).  01/30/13 Bedside swallow evaluation- Normal, Dys 3 (due to dentures) with thin liquids recommended.  Not followed for swallowing on Rehab.; Esophagram on 02/12/13 was normal, with no aspiration or penetration and no evidence of hiatal hernia.  13 mm tablet passed without delay. Diet Prior to this Study: Regular;Thin liquids Temperature Spikes Noted: No Respiratory Status: Nasal cannula History of Recent Intubation: No Behavior/Cognition: Alert;Cooperative;Pleasant mood Oral Cavity - Dentition: Dentures, top;Missing dentition Oral Motor / Sensory Function: Impaired - see Bedside swallow eval Self-Feeding Abilities: Able to feed self Patient Positioning: Upright in chair Baseline Vocal Quality: Clear Volitional Cough: Strong Volitional Swallow: Able to elicit Anatomy: Within functional limits Pharyngeal Secretions: Not observed secondary MBS    Reason for Referral Objectively evaluate swallowing function   Oral Phase Oral Preparation/Oral Phase Oral Phase: Impaired Oral - Pudding Oral - Pudding Teaspoon: Piecemeal swallowing Oral - Solids Oral - Puree: Piecemeal swallowing   Pharyngeal Phase Pharyngeal Phase Pharyngeal Phase: Within functional limits  Cervical Esophageal Phase    GO    Cervical Esophageal Phase Cervical Esophageal Phase: Vicente Masson T 03/19/2013, 4:51 PM

## 2013-03-19 NOTE — Progress Notes (Signed)
SPEECH PATHOLOGY  Pt. Was instructed to use a chin tuck head position when swallowing.  No further gagging noted with purees.  Maryjo Rochester, CCC-SLP

## 2013-03-19 NOTE — Progress Notes (Signed)
Utilization Review Completed.  

## 2013-03-19 NOTE — Evaluation (Signed)
Clinical/Bedside Swallow Evaluation Patient Details  Name: [name] MRN: [id number] Date of Birth: 19-May-1960  Today's Date: 03/19/2013 Time: 0925-1003 SLP Time Calculation (min): 38 min  Past Medical History:  Past Medical History  Diagnosis Date  . Coronary artery disease     a. s/p MI in 2009 in [place] with stenting of the LCX and LAD;  b. 12/2012 NSTEMI/CAD: LM nl, LAD patent prox stent, LCX 50-70 isr (FFR 0.84), RCA dom, 156m, EF 40-45%, sev basal to mid inf HK to AK-->Med Rx.  . Tobacco abuse     a. quit 12/2012.  . Ischemic cardiomyopathy     a. 12/2012 Ech: EF 40-45%, Gr 1 DD.  Marland Kitchen Hyperlipidemia   . Pericardial effusion   . CVA (cerebral infarction)   . 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 Stroke   . Hypoxia 03/18/2013  . Myocardial infarction   . Shortness of breath    Past Surgical History:  Past Surgical History  Procedure Laterality Date  . Coronary angioplasty with stent placement  2000's X 2    "1 + 1" (01/06/2013)  . Cardiac catheterization  01/06/2013  . Coronary artery bypass graft N/A 01/28/2013    Procedure: CORONARY ARTERY BYPASS GRAFTING (CABG);  Surgeon: Loreli Slot, MD;  Location: [facility] OR;  Service: Open Heart Surgery;  Laterality: N/A;  CABG x one, using left greater saphenous vein harvested endoscopically  . Intraoperative transesophageal echocardiogram N/A 01/28/2013    Procedure: INTRAOPERATIVE TRANSESOPHAGEAL ECHOCARDIOGRAM;  Surgeon: Loreli Slot, MD;  Location: [facility] OR;  Service: Open Heart Surgery;  Laterality: N/A;  . Ventricular aneurysm resection N/A 01/28/2013    Procedure: LEFT VENTRICULAR ANEURYSM REPAIR;  Surgeon: Loreli Slot, MD;  Location: [facility] OR;  Service: Open Heart Surgery;  Laterality: N/A;   HPI:      Assessment / Plan / Recommendation Clinical Impression  Pt. presents with an atypical dysphagia, with what appears to be normal swallow function with thin liquids even with large  consecutive swallows via straw, and gagging type behavior with purees, regurgitating the applesauce after swallowing and spitting into the emesis basin.  Pt. states this has been occuring for the past 2-3 days and seems to be getting worse.  An esophagram was completed on 02/12/13 that was normal.  Discussed case with Dr. Patty Sermons, who would like a MBS at this point to determine if this is globus hestericus verses esophageal dysfunction warranting further work-up.     Aspiration Risk  Mild    Diet Recommendation Thin liquid        Other  Recommendations Recommended Consults: MBS   Follow Up Recommendations       Frequency and Duration        Pertinent Vitals/Pain Afebrile; CXR: Mild bibasilar atelectasis/ infiltrate. Negative for heart failure. LS clear    SLP Swallow Goals     Swallow Study Prior Functional Status       General Previous Swallow Assessment: 01/30/13 Bedside swallow evaluation- Normal, Dys 3 (due to dentures) with thin liquids recommended.  Not followed for swallowing on Rehab.; Esophagram on 02/12/13 was normal, with no aspiration or penetration and no evidence of hiatal hernia.  13 mm tablet passed without delay. Diet Prior to this Study: Regular;Thin liquids Temperature Spikes Noted: No Respiratory Status: Nasal cannula History of Recent Intubation: No Behavior/Cognition: Alert;Cooperative;Pleasant mood Oral Cavity - Dentition: Dentures, top;Missing dentition Self-Feeding Abilities: Able to feed self Patient  Positioning: Upright in bed Baseline Vocal Quality: Clear Volitional Cough: Strong Volitional Swallow: Able to elicit    Oral/Motor/Sensory Function Overall Oral Motor/Sensory Function: Impaired at baseline Labial ROM: Reduced left Labial Symmetry: Abnormal symmetry left Labial Strength: Reduced Labial Sensation: Reduced Lingual ROM: Within Functional Limits Lingual Symmetry: Within Functional Limits Lingual Strength: Within Functional Limits    Ice Chips Ice chips: Within functional limits Presentation: Spoon   Thin Liquid Thin Liquid: Within functional limits Presentation: Spoon;Cup;Straw    Nectar Thick Nectar Thick Liquid: Not tested   Honey Thick Honey Thick Liquid: Not tested   Puree Puree: Impaired Presentation: Spoon Pharyngeal Phase Impairments: Multiple swallows;Throat Clearing - Immediate   Solid   GO    Solid: Not tested       Maryjo Rochester T 03/19/2013,10:04 AM

## 2013-03-19 NOTE — Progress Notes (Addendum)
Patient Name: [name] Date of Encounter: 03/19/2013     Active Problems:   Hypoxia    SUBJECTIVE  Patient is not having any chest pain this am. Rhythm is NSR. BP satisfactory. Still having problems swallowing. Gets nauseated after swallowing. No fever.  CURRENT MEDS . aspirin EC  325 mg Oral Daily  . clopidogrel  75 mg Oral Daily  . famotidine  20 mg Oral BID  . furosemide  20 mg Oral BID  . lisinopril  2.5 mg Oral Daily  . metoprolol succinate  75 mg Oral Daily  . potassium chloride SA  20 mEq Oral TID  . simvastatin  40 mg Oral QHS  . sodium chloride  3 mL Intravenous Q12H    OBJECTIVE  Filed Vitals:   03/19/13 0300 03/19/13 0335 03/19/13 0500 03/19/13 0600  BP: 100/73  106/70 102/70  Pulse: 109   104  Temp:   98 F (36.7 C)   TempSrc:   Oral   Resp: 33   8  Height:      Weight:  190 lb 11.2 oz (86.5 kg)    SpO2: 99%   100%    Intake/Output Summary (Last 24 hours) at 03/19/13 0813 Last data filed at 03/19/13 0700  Gross per 24 hour  Intake  139.1 ml  Output    175 ml  Net  -35.9 ml   Filed Weights   03/18/13 1018 03/19/13 0335  Weight: 189 lb (85.73 kg) 190 lb 11.2 oz (86.5 kg)    PHYSICAL EXAM  General: Pleasant, NAD. Neuro: Alert and oriented X 3. Weakness left arm following stroke. Psych: Normal affect. HEENT:  Normal. Pharynx reveals no thrush or exudate.  Neck: Supple without bruits or JVD. Lungs:  Resp regular and unlabored, CTA. Heart: RRR no s3, s4, or murmurs. Abdomen: Soft, non-tender, non-distended, BS + x 4.  Extremities: No clubbing, cyanosis or edema. DP/PT/Radials 2+ and equal bilaterally.  Accessory Clinical Findings  CBC  Recent Labs  03/18/13 1019 03/19/13 0323  WBC 9.5 14.6*  HGB 11.9* 12.1*  HCT 34.8* 36.4*  MCV 79.6 81.8  PLT 246 266   Basic Metabolic Panel  Recent Labs  03/18/13 1019 03/19/13 0323  NA 130* 132*  K 3.6 4.0  CL 93* 96  CO2 19 16*  GLUCOSE 110* 76  BUN 7 7  CREATININE 0.68  0.69  CALCIUM 9.2 9.3   Liver Function Tests  Recent Labs  03/18/13 1019  AST 32  ALT 13  ALKPHOS 147*  BILITOT 0.5  PROT 7.9  ALBUMIN 3.1*   No results found for this basename: LIPASE, AMYLASE,  in the last 72 hours Cardiac Enzymes  Recent Labs  03/18/13 1900 03/18/13 2340 03/19/13 0323  TROPONINI 1.94* 3.83* 2.89*   BNP No components found with this basename: POCBNP,  D-Dimer No results found for this basename: DDIMER,  in the last 72 hours Hemoglobin A1C No results found for this basename: HGBA1C,  in the last 72 hours Fasting Lipid Panel  Recent Labs  03/19/13 0323  CHOL 122  HDL 29*  LDLCALC 72  TRIG 725  CHOLHDL 4.2   Thyroid Function Tests No results found for this basename: TSH, T4TOTAL, FREET3, T3FREE, THYROIDAB,  in the last 72 hours  TELE  NSR  ECG  Persistent ST elevation inferior leads unchanged from 03/18/13  Radiology/Studies  Dg Chest 2 View  03/03/2013   CLINICAL DATA:  52 year old male with chest soreness. Left upper extremity weakness.  Initial encounter. Recent CABG and stroke.  EXAM: CHEST  2 VIEW  COMPARISON:  02/04/2013 and earlier.  FINDINGS: Sequelae of median sternotomy. Right IJ central line removed. Lung volumes a. Baseline. No pneumothorax, pulmonary edema, pleural effusion or consolidation. Mild linear scarring or atelectasis at the left lung base. Visualized tracheal air column is within normal limits. Stable visualized osseous structures.  IMPRESSION: Minor left lung base atelectasis or scarring. No acute cardiopulmonary abnormality.   Electronically Signed   By: Augusto Gamble M.D.   On: 03/03/2013 10:18   Ct Angio Chest Pe W/cm &/or Wo Cm  03/18/2013   CLINICAL DATA:  Shortness of breath and chest pain.  EXAM: CT ANGIOGRAPHY CHEST WITH CONTRAST  TECHNIQUE: Multidetector CT imaging of the chest was performed using the standard protocol during bolus administration of intravenous contrast. Multiplanar CT image reconstructions  including MIPs were obtained to evaluate the vascular anatomy.  CONTRAST:  63mL OMNIPAQUE IOHEXOL 350 MG/ML SOLN  COMPARISON:  Chest x-ray today.  FINDINGS: Evidence of subtle dependent bibasilar density likely atelectasis. Remainder of the lungs are clear. Median sternotomy wires are present. Findings suggesting a stent over the left anterior descending coronary artery. Pulmonary arterial system is well opacified in demonstrates no evidence of emboli. There is mild cardiomegaly. There is a significant pericardial effusion with somewhat loculated component of this pericardial effusion over the right anterior aspect of the pericardium with mild mass effect on the right atrium. Some of these changes may be related to patient's history of recent ventricular aneurysm resection. There is a 1.1 cm AP window lymph node. There are a few other small sub cm mediastinal and right pericardial phrenic lymph nodes. There is no hilar or axillary adenopathy.  Images through the upper abdomen are unremarkable. There is mild spondylosis of the spine.  Review of the MIP images confirms the above findings.  IMPRESSION: No evidence of pulmonary embolism.  Cardiomegaly with moderate pericardial effusion with somewhat loculated component of this pericardial effusion adjacent the right atrium with mild mass effect on the right atrium. This may be related patient's recent ventricular aneurysm resection.  Mild dependent bibasilar atelectatic change.  1.1 cm AP window lymph node likely reactive in nature.   Electronically Signed   By: Elberta Fortis M.D.   On: 03/18/2013 21:14   Dg Chest Port 1 View  03/18/2013   CLINICAL DATA:  Short of breath  EXAM: PORTABLE CHEST - 1 VIEW  COMPARISON:  03/03/2013  FINDINGS: Cardiac enlargement.  Pulmonary vascularity is normal.  Mild bibasilar atelectasis/infiltrate, increased from the prior study. Negative for effusion. Prior median sternotomy.  IMPRESSION: Mild bibasilar atelectasis/ infiltrate.   Negative for heart failure.   Electronically Signed   By: Marlan Palau M.D.   On: 03/18/2013 11:21    ASSESSMENT AND PLAN 1. S/P prior MI, complicated by stroke, pericarditis, subsequent purulent effusion with hemodynamic collapse - s/p CABG x 1 and repair of the pseudoaneurysm . 2. Swallowing disorder - Will ask speech therapy to see. Consider barium swallow? Possible early angioneurotic edema from ACE? Will stop lisinopril for now. 3. CAD - remote stents - no active chest pain. Troponins elevated.Will continue to follow.  4. CVA - still with left arm weakness .  Will ask PT to see. Ambulate. 5. Leukocytosis ? Etiology. Consider Dresslers. Will check inflammatory markers. 6. Pericardial effusion, moderate. Will continue ASA and Plavix but DC heparin now.  Signed, Cassell Clement MD

## 2013-03-19 NOTE — Progress Notes (Signed)
Social visit  I know [name] well from his previous hospitalization  He was readmitted yesterday with difficulty swallowing and shortness of breath  He feels a little better since admission  His CT showed no PE  HIs echo showed an EF of 25-30%, a small- moderate pericardial effusion- no tamponade and no MR  His BNP was mildly elevated and his troponins have been elevated since admission  Overall he looks very good all things considered  I suspect his SOB is CHF related, I'm not sure about the swallowing issues. He doesn't have any oral thrush but was on broad spectrum antibiotics for ahile so I think candida esophagitis is a possibility

## 2013-03-20 ENCOUNTER — Inpatient Hospital Stay: Payer: Self-pay | Admitting: Physical Medicine & Rehabilitation

## 2013-03-20 LAB — CLOSTRIDIUM DIFFICILE BY PCR: Toxigenic C. Difficile by PCR: POSITIVE — AB

## 2013-03-20 MED ORDER — LISINOPRIL 2.5 MG PO TABS
2.5000 mg | ORAL_TABLET | Freq: Every day | ORAL | Status: DC
  Administered 2013-03-20 – 2013-03-21 (×2): 2.5 mg via ORAL
  Filled 2013-03-20 (×2): qty 1

## 2013-03-20 MED ORDER — CARVEDILOL 6.25 MG PO TABS
6.2500 mg | ORAL_TABLET | Freq: Two times a day (BID) | ORAL | Status: DC
  Administered 2013-03-20: 6.25 mg via ORAL
  Filled 2013-03-20 (×5): qty 1

## 2013-03-20 MED ORDER — METRONIDAZOLE 500 MG PO TABS
500.0000 mg | ORAL_TABLET | Freq: Three times a day (TID) | ORAL | Status: DC
  Administered 2013-03-20 – 2013-03-25 (×16): 500 mg via ORAL
  Filled 2013-03-20 (×18): qty 1

## 2013-03-20 NOTE — Progress Notes (Signed)
Dr.Brackbill confirmed 12 Lead.

## 2013-03-20 NOTE — Consult Note (Signed)
Unassigned patient.  Reason for Consult: Dysphagia Referring Physician: Cardiology  [name] HPI: This is a 52 year old male with a significant cardiac history.  He was recently discharged from the hospital and followed up with Dr. Patty Sermons.  At the time of his office visit he reported issues with SOB, which was confirmed with dropping Pox saturations by Home Health.  As a result of his symptoms he was admitted to the hospital for further evaluation and treatment.  He states that he had issues with dysphagia that started one week ago.  He is not able to tolerate any solid foods.  When he consumes the food in his mouth he cannot move the food bolus from his mouth into this esophagus.  No issues with liquids.  He believes he has lost 5 lbs as a result of his symptoms.  No prior history of dysphagia and he denies being in any distress.  Extensive work up with an esophagram and MBS were normal.  No evidence of any aspiration and a 13 mm tablet was able to pass without any difficult.  No overt evidence of reflux with the esophagram.  Past Medical History  Diagnosis Date  . Coronary artery disease     a. s/p MI in 2009 in [place] with stenting of the LCX and LAD;  b. 12/2012 NSTEMI/CAD: LM nl, LAD patent prox stent, LCX 50-70 isr (FFR 0.84), RCA dom, 144m, EF 40-45%, sev basal to mid inf HK to AK-->Med Rx.  . Tobacco abuse     a. quit 12/2012.  . Ischemic cardiomyopathy     a. 12/2012 Ech: EF 40-45%, Gr 1 DD.  Marland Kitchen Hyperlipidemia   . Pericardial effusion   . CVA (cerebral infarction)   . 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 Stroke   . Hypoxia 03/18/2013  . Myocardial infarction   . Shortness of breath     Past Surgical History  Procedure Laterality Date  . Coronary angioplasty with stent placement  2000's X 2    "1 + 1" (01/06/2013)  . Cardiac catheterization  01/06/2013  . Coronary artery bypass graft N/A 01/28/2013    Procedure: CORONARY ARTERY BYPASS GRAFTING  (CABG);  Surgeon: Loreli Slot, MD;  Location: [facility] OR;  Service: Open Heart Surgery;  Laterality: N/A;  CABG x one, using left greater saphenous vein harvested endoscopically  . Intraoperative transesophageal echocardiogram N/A 01/28/2013    Procedure: INTRAOPERATIVE TRANSESOPHAGEAL ECHOCARDIOGRAM;  Surgeon: Loreli Slot, MD;  Location: [facility] OR;  Service: Open Heart Surgery;  Laterality: N/A;  . Ventricular aneurysm resection N/A 01/28/2013    Procedure: LEFT VENTRICULAR ANEURYSM REPAIR;  Surgeon: Loreli Slot, MD;  Location: [facility] OR;  Service: Open Heart Surgery;  Laterality: N/A;    Family History  Problem Relation Age of Onset  . CAD      Social History:  reports that he quit smoking about 2 months ago. His smoking use included Cigarettes. He has a 35 pack-year smoking history. He has never used smokeless tobacco. He reports that he does not drink alcohol or use illicit drugs.  Allergies: No Known Allergies  Medications:  Scheduled: . aspirin EC  81 mg Oral Daily  . carvedilol  6.25 mg Oral BID WC  . clopidogrel  75 mg Oral Daily  . famotidine  20 mg Oral BID  . furosemide  20 mg Oral BID  . heparin subcutaneous  5,000 Units Subcutaneous  Q8H  . lisinopril  2.5 mg Oral Daily  . metroNIDAZOLE  500 mg Oral Q8H  . potassium chloride SA  20 mEq Oral TID  . simvastatin  40 mg Oral QHS  . sodium chloride  3 mL Intravenous Q12H   Continuous:   Results for orders placed during the hospital encounter of 03/18/13 (from the past 24 hour(s))  TROPONIN I     Status: Abnormal   Collection Time    03/19/13  7:25 PM      Result Value Range   Troponin I 2.23 (*) <0.30 ng/mL  CLOSTRIDIUM DIFFICILE BY PCR     Status: Abnormal   Collection Time    03/20/13 10:53 AM      Result Value Range   C difficile by pcr POSITIVE (*) NEGATIVE     Ct Angio Chest Pe W/cm &/or Wo Cm  03/18/2013   CLINICAL DATA:  Shortness of breath and chest pain.  EXAM: CT ANGIOGRAPHY CHEST WITH  CONTRAST  TECHNIQUE: Multidetector CT imaging of the chest was performed using the standard protocol during bolus administration of intravenous contrast. Multiplanar CT image reconstructions including MIPs were obtained to evaluate the vascular anatomy.  CONTRAST:  63mL OMNIPAQUE IOHEXOL 350 MG/ML SOLN  COMPARISON:  Chest x-ray today.  FINDINGS: Evidence of subtle dependent bibasilar density likely atelectasis. Remainder of the lungs are clear. Median sternotomy wires are present. Findings suggesting a stent over the left anterior descending coronary artery. Pulmonary arterial system is well opacified in demonstrates no evidence of emboli. There is mild cardiomegaly. There is a significant pericardial effusion with somewhat loculated component of this pericardial effusion over the right anterior aspect of the pericardium with mild mass effect on the right atrium. Some of these changes may be related to patient's history of recent ventricular aneurysm resection. There is a 1.1 cm AP window lymph node. There are a few other small sub cm mediastinal and right pericardial phrenic lymph nodes. There is no hilar or axillary adenopathy.  Images through the upper abdomen are unremarkable. There is mild spondylosis of the spine.  Review of the MIP images confirms the above findings.  IMPRESSION: No evidence of pulmonary embolism.  Cardiomegaly with moderate pericardial effusion with somewhat loculated component of this pericardial effusion adjacent the right atrium with mild mass effect on the right atrium. This may be related patient's recent ventricular aneurysm resection.  Mild dependent bibasilar atelectatic change.  1.1 cm AP window lymph node likely reactive in nature.   Electronically Signed   By: Elberta Fortis M.Tucker.   On: 03/18/2013 21:14   Dg Swallowing Func-speech Pathology  03/19/2013   Lenor Derrick, CCC-SLP     03/19/2013  4:52 PM Objective Swallowing Evaluation: Modified Barium Swallowing Study   Patient  Details  Name: [name] MRN: [id number] Date of Birth: Dec 17, 1960  Today's Date: 03/19/2013 Time: 1340-1415 SLP Time Calculation (min): 35 min  Past Medical History:  Past Medical History  Diagnosis Date  . Coronary artery disease     a. s/p MI in 2009 in [place] with stenting of the LCX and LAD;  b.  12/2012 NSTEMI/CAD: LM nl, LAD patent prox stent, LCX 50-70 isr  (FFR 0.84), RCA dom, 11m, EF 40-45%, sev basal to mid inf HK to  AK-->Med Rx.  . Tobacco abuse     a. quit 12/2012.  . Ischemic cardiomyopathy     a. 12/2012 Ech: EF 40-45%, Gr 1 DD.  Marland Kitchen Hyperlipidemia   . Pericardial  effusion   . CVA (cerebral infarction)   . 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 Stroke   . Hypoxia 03/18/2013  . Myocardial infarction   . Shortness of breath    Past Surgical History:  Past Surgical History  Procedure Laterality Date  . Coronary angioplasty with stent placement  2000's X 2    "1 + 1" (01/06/2013)  . Cardiac catheterization  01/06/2013  . Coronary artery bypass graft N/A 01/28/2013    Procedure: CORONARY ARTERY BYPASS GRAFTING (CABG);  Surgeon:  Loreli Slot, MD;  Location:  [facility] OR;  Service: Open Heart  Surgery;  Laterality: N/A;  CABG x one, using left greater  saphenous vein harvested endoscopically  . Intraoperative transesophageal echocardiogram N/A 01/28/2013    Procedure: INTRAOPERATIVE TRANSESOPHAGEAL ECHOCARDIOGRAM;   Surgeon: Loreli Slot, MD;  Location: [facility] OR;  Service:  Open Heart Surgery;  Laterality: N/A;  . Ventricular aneurysm resection N/A 01/28/2013    Procedure: LEFT VENTRICULAR ANEURYSM REPAIR;  Surgeon: Loreli Slot, MD;  Location: [facility] OR;  Service: Open Heart Surgery;   Laterality: N/A;   HPI:  Patient presents from the cardiology clinic.  Per report the  patient had hypoxia there, with oxygen saturation in the mid-80s  this improved with supplemental oxygen.  Pt. exhibits gagging  behaviors and spits bolus into emesis basin stating "I can't  swallow."      Assessment / Plan / Recommendation Clinical Impression  Dysphagia Diagnosis: Within Functional Limits (Gagging behaviors  inconsistent) Clinical impression: Pt. was able to swallow thin and nectar  thick liquids, swallowed a 13mm barium tablet with thin liquid,  and chewed and swallowed a graham cracker without difficulty.   However, he exhibited significant gagging behaviors (with pudding  and purees) before swallowing, while the bolus was in the  anterior portion of the oral cavity, then spat out the bolus into  an emesis basin.  The pt. began coughing and bringing up thick,  yellow mucous (noted there was no barium in the mucous).  The  esophagus was completely clear after all boluses, and there was  no aspiration, penetration, laryngeal or esophageal residue.  ?  if the is psychosomatic vs. a lower GI issue. Pt. denies being  under stress or having any emotional issues.    Treatment Recommendation  No treatment recommended at this time    Diet Recommendation Dysphagia 3 (Mechanical Soft);Thin liquid   Liquid Administration via: Cup;Straw Medication Administration: Whole meds with liquid Supervision: Patient able to self feed;Intermittent supervision  to cue for compensatory strategies    Other  Recommendations Recommended Consults: Consider GI  evaluation   Follow Up Recommendations  None    Frequency and Duration        Pertinent Vitals/Pain Less gagging with use of chin tuck.    SLP Swallow Goals  n/a   General HPI: Patient presents from the cardiology clinic.  Per  report the patient had hypoxia there, with oxygen saturation in  the mid-80s this improved with supplemental oxygen.  Pt. exhibits  gagging behaviors and spits bolus into emesis basin stating "I  can't swallow." Type of Study: Modified Barium Swallowing Study Reason for Referral: Objectively evaluate swallowing function Previous Swallow Assessment: BSE completed this am (see report).   01/30/13 Bedside swallow evaluation- Normal, Dys 3 (due to   dentures) with thin liquids recommended.  Not followed for  swallowing on Rehab.; Esophagram on 02/12/13 was  normal, with no  aspiration or penetration and no evidence of hiatal hernia.  13  mm tablet passed without delay. Diet Prior to this Study: Regular;Thin liquids Temperature Spikes Noted: No Respiratory Status: Nasal cannula History of Recent Intubation: No Behavior/Cognition: Alert;Cooperative;Pleasant mood Oral Cavity - Dentition: Dentures, top;Missing dentition Oral Motor / Sensory Function: Impaired - see Bedside swallow  eval Self-Feeding Abilities: Able to feed self Patient Positioning: Upright in chair Baseline Vocal Quality: Clear Volitional Cough: Strong Volitional Swallow: Able to elicit Anatomy: Within functional limits Pharyngeal Secretions: Not observed secondary MBS    Reason for Referral Objectively evaluate swallowing function   Oral Phase Oral Preparation/Oral Phase Oral Phase: Impaired Oral - Pudding Oral - Pudding Teaspoon: Piecemeal swallowing Oral - Solids Oral - Puree: Piecemeal swallowing   Pharyngeal Phase Pharyngeal Phase Pharyngeal Phase: Within functional limits  Cervical Esophageal Phase    GO    Cervical Esophageal Phase Cervical Esophageal Phase: Vicente Masson T 03/19/2013, 4:51 PM     ROS:  As stated above in the HPI otherwise negative.  Blood pressure 86/64, pulse 91, temperature 98.1 F (36.7 C), temperature source Oral, resp. rate 29, height 6\' 1"  (1.854 m), weight 188 lb 4.4 oz (85.4 kg), SpO2 100.00%.    PE: Gen: NAD, Alert and Oriented HEENT:  [place]/AT, EOMI Neck: Supple, no LAD Lungs: CTA Bilaterally CV: RRR without M/G/R ABM: Soft, NTND, +BS Ext: No C/C/E  Assessment/Plan: 1) Oropharyngeal dysphagia. 2) Recent MI. 3) Pericarditis.   There is no evidence of a new stroke.  His intubation does not appear to have caused any problems with his esophagus.  No complaints of odynophagia.  There is no esophageal disorder as a cause of his symptoms.   Speech Path suggested the possibility of globus hystericus, which can be a possibility.  I think he can benefit with the continued chin tuck and to eat solid food in very small portions.  I suspect that his symptoms will eventually resolve spontaneously, but only time will tell.  Plan: 1) Continue with Speech Path recommendations. 2) No GI intervention at this time.  [name] 03/20/2013, 5:42 PM

## 2013-03-20 NOTE — Progress Notes (Signed)
Paged Theodore Demark , PA. Reported Pt lab results for c.diff came back positive. Also pt requesting change in diet cannot tolerate " solid hard foods". Spoke with Theodore Demark. Will follow up with orders.

## 2013-03-20 NOTE — Progress Notes (Signed)
Patient Name: [name] Date of Encounter: 03/20/2013     Active Problems:   Hypoxia    SUBJECTIVE  Minimal chest pain when he takes his first deep breath only. Having diarrhea for past several days. Still having difficulties with swallowing.  CURRENT MEDS . aspirin EC  81 mg Oral Daily  . clopidogrel  75 mg Oral Daily  . famotidine  20 mg Oral BID  . furosemide  20 mg Oral BID  . heparin subcutaneous  5,000 Units Subcutaneous Q8H  . metoprolol succinate  75 mg Oral Daily  . potassium chloride SA  20 mEq Oral TID  . simvastatin  40 mg Oral QHS  . sodium chloride  3 mL Intravenous Q12H    OBJECTIVE  Filed Vitals:   03/19/13 1200 03/19/13 2025 03/20/13 0000 03/20/13 0400  BP: 94/65 102/70 111/74 106/75  Pulse: 103 103 109 104  Temp: 98.2 F (36.8 C) 99.3 F (37.4 C) 99.2 F (37.3 C) 98.4 F (36.9 C)  TempSrc: Oral Oral Oral Oral  Resp: 22 14 17  32  Height:      Weight:    188 lb 4.4 oz (85.4 kg)  SpO2: 96% 96% 98% 98%    Intake/Output Summary (Last 24 hours) at 03/20/13 0757 Last data filed at 03/19/13 1800  Gross per 24 hour  Intake    214 ml  Output      0 ml  Net    214 ml   Filed Weights   03/18/13 1018 03/19/13 0335 03/20/13 0400  Weight: 189 lb (85.73 kg) 190 lb 11.2 oz (86.5 kg) 188 lb 4.4 oz (85.4 kg)    PHYSICAL EXAM  General: Pleasant, NAD. Neuro: Alert and oriented X 3. Weakness left arm following stroke. Psych: Normal affect. HEENT:  Normal. Pharynx reveals no thrush or exudate.  Neck: Supple without bruits or JVD. Lungs:  Resp regular and unlabored, CTA. Heart: RRR no s3, s4, or murmurs. Abdomen: Soft, non-tender, non-distended, BS + x 4.  Extremities: No clubbing, cyanosis or edema. DP/PT/Radials 2+ and equal bilaterally.  Accessory Clinical Findings  CBC  Recent Labs  03/18/13 1019 03/19/13 0323  WBC 9.5 14.6*  HGB 11.9* 12.1*  HCT 34.8* 36.4*  MCV 79.6 81.8  PLT 246 266   Basic Metabolic Panel  Recent Labs  03/18/13 1019 03/19/13 0323  NA 130* 132*  K 3.6 4.0  CL 93* 96  CO2 19 16*  GLUCOSE 110* 76  BUN 7 7  CREATININE 0.68 0.69  CALCIUM 9.2 9.3   Liver Function Tests  Recent Labs  03/18/13 1019  AST 32  ALT 13  ALKPHOS 147*  BILITOT 0.5  PROT 7.9  ALBUMIN 3.1*   No results found for this basename: LIPASE, AMYLASE,  in the last 72 hours Cardiac Enzymes  Recent Labs  03/19/13 0323 03/19/13 0920 03/19/13 1925  TROPONINI 2.89* 3.27* 2.23*   BNP No components found with this basename: POCBNP,  D-Dimer No results found for this basename: DDIMER,  in the last 72 hours Hemoglobin A1C No results found for this basename: HGBA1C,  in the last 72 hours Fasting Lipid Panel  Recent Labs  03/19/13 0323  CHOL 122  HDL 29*  LDLCALC 72  TRIG 191  CHOLHDL 4.2   Thyroid Function Tests No results found for this basename: TSH, T4TOTAL, FREET3, T3FREE, THYROIDAB,  in the last 72 hours  TELE  NSR  ECG  Persistent ST elevation inferior leads unchanged from 03/18/13  Radiology/Studies  Dg Chest 2 View  03/03/2013   CLINICAL DATA:  52 year old male with chest soreness. Left upper extremity weakness. Initial encounter. Recent CABG and stroke.  EXAM: CHEST  2 VIEW  COMPARISON:  02/04/2013 and earlier.  FINDINGS: Sequelae of median sternotomy. Right IJ central line removed. Lung volumes a. Baseline. No pneumothorax, pulmonary edema, pleural effusion or consolidation. Mild linear scarring or atelectasis at the left lung base. Visualized tracheal air column is within normal limits. Stable visualized osseous structures.  IMPRESSION: Minor left lung base atelectasis or scarring. No acute cardiopulmonary abnormality.   Electronically Signed   By: Augusto Gamble M.D.   On: 03/03/2013 10:18   Ct Angio Chest Pe W/cm &/or Wo Cm  03/18/2013   CLINICAL DATA:  Shortness of breath and chest pain.  EXAM: CT ANGIOGRAPHY CHEST WITH CONTRAST  TECHNIQUE: Multidetector CT imaging of the chest was  performed using the standard protocol during bolus administration of intravenous contrast. Multiplanar CT image reconstructions including MIPs were obtained to evaluate the vascular anatomy.  CONTRAST:  63mL OMNIPAQUE IOHEXOL 350 MG/ML SOLN  COMPARISON:  Chest x-ray today.  FINDINGS: Evidence of subtle dependent bibasilar density likely atelectasis. Remainder of the lungs are clear. Median sternotomy wires are present. Findings suggesting a stent over the left anterior descending coronary artery. Pulmonary arterial system is well opacified in demonstrates no evidence of emboli. There is mild cardiomegaly. There is a significant pericardial effusion with somewhat loculated component of this pericardial effusion over the right anterior aspect of the pericardium with mild mass effect on the right atrium. Some of these changes may be related to patient's history of recent ventricular aneurysm resection. There is a 1.1 cm AP window lymph node. There are a few other small sub cm mediastinal and right pericardial phrenic lymph nodes. There is no hilar or axillary adenopathy.  Images through the upper abdomen are unremarkable. There is mild spondylosis of the spine.  Review of the MIP images confirms the above findings.  IMPRESSION: No evidence of pulmonary embolism.  Cardiomegaly with moderate pericardial effusion with somewhat loculated component of this pericardial effusion adjacent the right atrium with mild mass effect on the right atrium. This may be related patient's recent ventricular aneurysm resection.  Mild dependent bibasilar atelectatic change.  1.1 cm AP window lymph node likely reactive in nature.   Electronically Signed   By: Elberta Fortis M.D.   On: 03/18/2013 21:14   Dg Chest Port 1 View  03/18/2013   CLINICAL DATA:  Short of breath  EXAM: PORTABLE CHEST - 1 VIEW  COMPARISON:  03/03/2013  FINDINGS: Cardiac enlargement.  Pulmonary vascularity is normal.  Mild bibasilar atelectasis/infiltrate, increased  from the prior study. Negative for effusion. Prior median sternotomy.  IMPRESSION: Mild bibasilar atelectasis/ infiltrate.  Negative for heart failure.   Electronically Signed   By: Marlan Palau M.D.   On: 03/18/2013 11:21    ASSESSMENT AND PLAN 1. S/P prior MI, complicated by stroke, pericarditis, subsequent purulent effusion with hemodynamic collapse - s/p CABG x 1 and repair of the pseudoaneurysm . Cardiac enzymes persistenly high. Sed rate and CRP are very high. 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 Will consider adding steroids empirically after GI evaluation. 2. Swallowing disorder - Speech therapy has seen and recommends GI consult. Dr. Dorris Fetch raises question of candida esophagitis. 3. CAD - remote stents - no active chest pain. Troponins elevated.Will continue to follow.  4. CVA - still with left arm  weakness . 5. Leukocytosis ? Etiology. Consider Dresslers.  6. Pericardial effusion, moderate. Will continue ASA and Plavix  7. Diarrhea.  Will request C. Diff 8. LV systolic dysfunction with EF 25-30%.  Will switch to carvedilol and restart ACE. 9. Deconditioning. Will ask Cardiac rehab to ambulate. Signed, Cassell Clement MD

## 2013-03-20 NOTE — Evaluation (Signed)
Physical Therapy Evaluation Patient Details Name: [name] MRN: [id number] DOB: [birth year] Today's Date: 03/20/2013 Time: 4098-1191 PT Time Calculation (min): 15 min  PT Assessment / Plan / Recommendation History of Present Illness  Pt adm with chest pain.  Pt recently dc'd after MI, CABG, rupture of pseudoaneurysm, and rt CVA. Pt found to be +C-diff  Clinical Impression  Pt admitted with above. Pt currently with functional limitations due to the deficits listed below (see PT Problem List). Expect mobility will be adequate for return home once BP issues resolved. Pt will benefit from skilled PT to increase their independence and safety with mobility to allow discharge home     PT Assessment  Patient needs continued PT services    Follow Up Recommendations  Home health PT;Supervision - Intermittent    Does the patient have the potential to tolerate intense rehabilitation      Barriers to Discharge        Equipment Recommendations  None recommended by PT    Recommendations for Other Services     Frequency Min 3X/week    Precautions / Restrictions Precautions Precautions: Fall Precaution Comments: orthostatic   Pertinent Vitals/Pain See flow sheet      Mobility  Bed Mobility Bed Mobility: Supine to Sit;Sit to Supine Supine to Sit: 6: Modified independent (Device/Increase time);HOB elevated Sit to Supine: 6: Modified independent (Device/Increase time);HOB elevated Transfers Transfers: Sit to Stand;Stand to Sit Sit to Stand: 4: Min guard;With upper extremity assist;From bed Stand to Sit: 4: Min guard;With upper extremity assist;To bed Details for Transfer Assistance: verbal cues for hand placement Ambulation/Gait Ambulation/Gait Assistance: Not tested (comment) (due to orthostatic)    Exercises     PT Diagnosis: Difficulty walking;Hemiplegia non-dominant side  PT Problem List: Decreased strength;Decreased activity tolerance;Decreased balance;Decreased  mobility PT Treatment Interventions: DME instruction;Gait training;Functional mobility training;Therapeutic activities;Therapeutic exercise;Balance training;Patient/family education     PT Goals(Current goals can be found in the care plan section) Acute Rehab PT Goals Patient Stated Goal: return home PT Goal Formulation: With patient Time For Goal Achievement: 03/27/13 Potential to Achieve Goals: Good  Visit Information  Last PT Received On: 03/20/13 Assistance Needed: +1 History of Present Illness: Pt adm with chest pain.  Pt recently dc'd after MI, CABG, rupture of pseudoaneurysm, and rt CVA. Pt found to be +C-diff       Prior Functioning  Home Living Family/patient expects to be discharged to:: Private residence Available Help at Discharge: Family;Available 24 hours/day Type of Home: Apartment Home Access: Level entry Home Layout: Two level;Bed/bath upstairs Alternate Level Stairs-Number of Steps: 10-12 steps to second floor, patients bedroom and full bathroom located on second floor.  Alternate Level Stairs-Rails: Can reach both Additional Comments: Patient reports doors in home are thin and does not think they will fit a wheelchair or a walker.  Prior Function Level of Independence: Needs assistance Gait / Transfers Assistance Needed: amb mod I with quad cane Communication Communication: No difficulties Dominant Hand: Right    Cognition  Cognition Arousal/Alertness: Awake/alert Behavior During Therapy: WFL for tasks assessed/performed Overall Cognitive Status: Within Functional Limits for tasks assessed    Extremity/Trunk Assessment Lower Extremity Assessment Lower Extremity Assessment: LLE deficits/detail LLE Deficits / Details: grossly 3/5   Balance Balance Balance Assessed: Yes Static Standing Balance Static Standing - Balance Support: Right upper extremity supported Static Standing - Level of Assistance: 5: Stand by assistance  End of Session PT - End of  Session Activity Tolerance: Treatment limited secondary to medical complications (  Comment) (orthostatic) Patient left: in bed;with call bell/phone within reach Nurse Communication: Mobility status;Other (comment) (bp's)  GP     [name] 03/20/2013, 3:49 PM  [facility] PT [phone]

## 2013-03-20 NOTE — Progress Notes (Addendum)
CARDIAC REHAB PHASE I   PRE:  Rate/Rhythm: 95 SR    BP: lying 94/71, sitting 85/62, standing 75/59    SaO2: 97 RA  MODE:  Ambulation: to BSC   POST:  Rate/Rhythm: 110 ST standing    BP: sitting 76/56     SaO2: 97 RA  Pt with orthostatic BP, nausea and slight dizziness upon standing. To BSC to have BM. Sat for 15 min while using BSC and bathing. Pt stood again to clean up and c/o dizziness and nausea, esp with prolonged standing. Upon sitting BP 59/47.  Immediately moved pt back to bed and finished bathing. BP up to 89/60 flat in bed with feet elevated. Second BP 91/60. Felt much better. Applied 2L. Unable to ambulate at this time. Will f/u. 1100-1205  Elissa Lovett Savage CES, ACSM 03/20/2013 12:11 PM

## 2013-03-20 NOTE — Progress Notes (Signed)
Called by RN with lab results. C. Diff was +, pt put on Flagyl. Also did dietary recommendations per speech recs. Continue to follow.

## 2013-03-20 NOTE — Progress Notes (Signed)
Pt BP systolic trending in the 80s to 90s sys. Last BP 96/70.  Paged Janell Quiet will continue to monitor pt.

## 2013-03-21 MED ORDER — PREDNISONE 20 MG PO TABS
40.0000 mg | ORAL_TABLET | Freq: Every day | ORAL | Status: DC
  Administered 2013-03-21 – 2013-03-25 (×5): 40 mg via ORAL
  Filled 2013-03-21 (×7): qty 2

## 2013-03-21 MED ORDER — CARVEDILOL 3.125 MG PO TABS
3.1250 mg | ORAL_TABLET | Freq: Two times a day (BID) | ORAL | Status: DC
  Administered 2013-03-22 – 2013-03-24 (×6): 3.125 mg via ORAL
  Filled 2013-03-21 (×10): qty 1

## 2013-03-21 NOTE — Progress Notes (Signed)
Patient Name: [name] Date of Encounter: 03/21/2013     Active Problems:   Hypoxia    SUBJECTIVE  No significant chest pain.  The patient became hypotensive when trying to ambulate in the hall yesterday. He is having about 2 loose stools a day and stool is positive for C. difficile and he is now on metronidazole. The patient is no longer hypoxic.  O2 sats are 98% on room air.  CURRENT MEDS . aspirin EC  81 mg Oral Daily  . carvedilol  6.25 mg Oral BID WC  . clopidogrel  75 mg Oral Daily  . famotidine  20 mg Oral BID  . furosemide  20 mg Oral BID  . heparin subcutaneous  5,000 Units Subcutaneous Q8H  . lisinopril  2.5 mg Oral Daily  . metroNIDAZOLE  500 mg Oral Q8H  . potassium chloride SA  20 mEq Oral TID  . simvastatin  40 mg Oral QHS  . sodium chloride  3 mL Intravenous Q12H    OBJECTIVE  Filed Vitals:   03/20/13 1600 03/20/13 1938 03/20/13 2328 03/21/13 0423  BP: 86/64 92/68 92/65  100/71  Pulse: 91 90 93 91  Temp: 98.1 F (36.7 C) 98 F (36.7 C) 98.4 F (36.9 C) 98.7 F (37.1 C)  TempSrc: Oral Oral Oral Oral  Resp: 29 28 23 21   Height:      Weight:    192 lb 0.3 oz (87.1 kg)  SpO2: 100% 100% 100% 100%    Intake/Output Summary (Last 24 hours) at 03/21/13 1018 Last data filed at 03/20/13 1800  Gross per 24 hour  Intake    240 ml  Output    150 ml  Net     90 ml   Filed Weights   03/19/13 0335 03/20/13 0400 03/21/13 0423  Weight: 190 lb 11.2 oz (86.5 kg) 188 lb 4.4 oz (85.4 kg) 192 lb 0.3 oz (87.1 kg)    PHYSICAL EXAM  General: Pleasant, NAD. Neuro: Alert and oriented X 3. Moves all extremities spontaneously. Psych: Normal affect. HEENT:  Normal  Neck: Supple without bruits or JVD. Lungs:  Resp regular and unlabored, CTA. Heart: RRR no s3, s4, or murmurs. Abdomen: Soft, non-tender, non-distended, BS + x 4.  Extremities: No clubbing, cyanosis or edema. DP/PT/Radials 2+ and equal bilaterally.  Accessory Clinical  Findings  CBC  Recent Labs  03/18/13 1019 03/19/13 0323  WBC 9.5 14.6*  HGB 11.9* 12.1*  HCT 34.8* 36.4*  MCV 79.6 81.8  PLT 246 266   Basic Metabolic Panel  Recent Labs  03/18/13 1019 03/19/13 0323  NA 130* 132*  K 3.6 4.0  CL 93* 96  CO2 19 16*  GLUCOSE 110* 76  BUN 7 7  CREATININE 0.68 0.69  CALCIUM 9.2 9.3   Liver Function Tests  Recent Labs  03/18/13 1019  AST 32  ALT 13  ALKPHOS 147*  BILITOT 0.5  PROT 7.9  ALBUMIN 3.1*   No results found for this basename: LIPASE, AMYLASE,  in the last 72 hours Cardiac Enzymes  Recent Labs  03/19/13 0323 03/19/13 0920 03/19/13 1925  TROPONINI 2.89* 3.27* 2.23*   BNP No components found with this basename: POCBNP,  D-Dimer No results found for this basename: DDIMER,  in the last 72 hours Hemoglobin A1C No results found for this basename: HGBA1C,  in the last 72 hours Fasting Lipid Panel  Recent Labs  03/19/13 0323  CHOL 122  HDL 29*  LDLCALC 72  TRIG 275  CHOLHDL 4.2   Thyroid Function Tests No results found for this basename: TSH, T4TOTAL, FREET3, T3FREE, THYROIDAB,  in the last 72 hours  TELE  Normal sinus rhythm  ECG  Persistent ST segment elevation in inferior leads consistent with old inferior wall myocardial infarction or with rupture into pericardium  Radiology/Studies  Dg Chest 2 View  03/03/2013   CLINICAL DATA:  52 year old male with chest soreness. Left upper extremity weakness. Initial encounter. Recent CABG and stroke.  EXAM: CHEST  2 VIEW  COMPARISON:  02/04/2013 and earlier.  FINDINGS: Sequelae of median sternotomy. Right IJ central line removed. Lung volumes a. Baseline. No pneumothorax, pulmonary edema, pleural effusion or consolidation. Mild linear scarring or atelectasis at the left lung base. Visualized tracheal air column is within normal limits. Stable visualized osseous structures.  IMPRESSION: Minor left lung base atelectasis or scarring. No acute cardiopulmonary  abnormality.   Electronically Signed   By: Augusto Gamble M.D.   On: 03/03/2013 10:18   Ct Angio Chest Pe W/cm &/or Wo Cm  03/18/2013   CLINICAL DATA:  Shortness of breath and chest pain.  EXAM: CT ANGIOGRAPHY CHEST WITH CONTRAST  TECHNIQUE: Multidetector CT imaging of the chest was performed using the standard protocol during bolus administration of intravenous contrast. Multiplanar CT image reconstructions including MIPs were obtained to evaluate the vascular anatomy.  CONTRAST:  63mL OMNIPAQUE IOHEXOL 350 MG/ML SOLN  COMPARISON:  Chest x-ray today.  FINDINGS: Evidence of subtle dependent bibasilar density likely atelectasis. Remainder of the lungs are clear. Median sternotomy wires are present. Findings suggesting a stent over the left anterior descending coronary artery. Pulmonary arterial system is well opacified in demonstrates no evidence of emboli. There is mild cardiomegaly. There is a significant pericardial effusion with somewhat loculated component of this pericardial effusion over the right anterior aspect of the pericardium with mild mass effect on the right atrium. Some of these changes may be related to patient's history of recent ventricular aneurysm resection. There is a 1.1 cm AP window lymph node. There are a few other small sub cm mediastinal and right pericardial phrenic lymph nodes. There is no hilar or axillary adenopathy.  Images through the upper abdomen are unremarkable. There is mild spondylosis of the spine.  Review of the MIP images confirms the above findings.  IMPRESSION: No evidence of pulmonary embolism.  Cardiomegaly with moderate pericardial effusion with somewhat loculated component of this pericardial effusion adjacent the right atrium with mild mass effect on the right atrium. This may be related patient's recent ventricular aneurysm resection.  Mild dependent bibasilar atelectatic change.  1.1 cm AP window lymph node likely reactive in nature.   Electronically Signed   By:  Elberta Fortis M.D.   On: 03/18/2013 21:14   Dg Chest Port 1 View  03/18/2013   CLINICAL DATA:  Short of breath  EXAM: PORTABLE CHEST - 1 VIEW  COMPARISON:  03/03/2013  FINDINGS: Cardiac enlargement.  Pulmonary vascularity is normal.  Mild bibasilar atelectasis/infiltrate, increased from the prior study. Negative for effusion. Prior median sternotomy.  IMPRESSION: Mild bibasilar atelectasis/ infiltrate.  Negative for heart failure.   Electronically Signed   By: Marlan Palau M.D.   On: 03/18/2013 11:21   Dg Swallowing Func-speech Pathology  03/19/2013   Lenor Derrick, CCC-SLP     03/19/2013  4:52 PM Objective Swallowing Evaluation: Modified Barium Swallowing Study   Patient Details  Name: LYDIA MENG MRN: [id number] Date of Birth: 18-Apr-1960  Today's Date: 03/19/2013 Time: 8413-2440  SLP Time Calculation (min): 35 min  Past Medical History:  Past Medical History  Diagnosis Date  . Coronary artery disease     a. s/p MI in 2009 in [place] with stenting of the LCX and LAD;  b.  12/2012 NSTEMI/CAD: LM nl, LAD patent prox stent, LCX 50-70 isr  (FFR 0.84), RCA dom, 142m, EF 40-45%, sev basal to mid inf HK to  AK-->Med Rx.  . Tobacco abuse     a. quit 12/2012.  . Ischemic cardiomyopathy     a. 12/2012 Ech: EF 40-45%, Gr 1 DD.  Marland Kitchen Hyperlipidemia   . Pericardial effusion   . CVA (cerebral infarction)   . 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 Stroke   . Hypoxia 03/18/2013  . Myocardial infarction   . Shortness of breath    Past Surgical History:  Past Surgical History  Procedure Laterality Date  . Coronary angioplasty with stent placement  2000's X 2    "1 + 1" (01/06/2013)  . Cardiac catheterization  01/06/2013  . Coronary artery bypass graft N/A 01/28/2013    Procedure: CORONARY ARTERY BYPASS GRAFTING (CABG);  Surgeon:  Loreli Slot, MD;  Location: [facility] OR;  Service: Open Heart  Surgery;  Laterality: N/A;  CABG x one, using left greater  saphenous vein harvested endoscopically  . Intraoperative  transesophageal echocardiogram N/A 01/28/2013    Procedure: INTRAOPERATIVE TRANSESOPHAGEAL ECHOCARDIOGRAM;   Surgeon: Loreli Slot, MD;  Location: [facility] OR;  Service:  Open Heart Surgery;  Laterality: N/A;  . Ventricular aneurysm resection N/A 01/28/2013    Procedure: LEFT VENTRICULAR ANEURYSM REPAIR;  Surgeon: Loreli Slot, MD;  Location: [facility] OR;  Service: Open Heart Surgery;   Laterality: N/A;   HPI:  Patient presents from the cardiology clinic.  Per report the  patient had hypoxia there, with oxygen saturation in the mid-80s  this improved with supplemental oxygen.  Pt. exhibits gagging  behaviors and spits bolus into emesis basin stating "I can't  swallow."     Assessment / Plan / Recommendation Clinical Impression  Dysphagia Diagnosis: Within Functional Limits (Gagging behaviors  inconsistent) Clinical impression: Pt. was able to swallow thin and nectar  thick liquids, swallowed a 13mm barium tablet with thin liquid,  and chewed and swallowed a graham cracker without difficulty.   However, he exhibited significant gagging behaviors (with pudding  and purees) before swallowing, while the bolus was in the  anterior portion of the oral cavity, then spat out the bolus into  an emesis basin.  The pt. began coughing and bringing up thick,  yellow mucous (noted there was no barium in the mucous).  The  esophagus was completely clear after all boluses, and there was  no aspiration, penetration, laryngeal or esophageal residue.  ?  if the is psychosomatic vs. a lower GI issue. Pt. denies being  under stress or having any emotional issues.    Treatment Recommendation  No treatment recommended at this time    Diet Recommendation Dysphagia 3 (Mechanical Soft);Thin liquid   Liquid Administration via: Cup;Straw Medication Administration: Whole meds with liquid Supervision: Patient able to self feed;Intermittent supervision  to cue for compensatory strategies    Other  Recommendations Recommended Consults:  Consider GI  evaluation   Follow Up Recommendations  None    Frequency and Duration        Pertinent Vitals/Pain Less gagging with use of chin tuck.  SLP Swallow Goals  n/a   General HPI: Patient presents from the cardiology clinic.  Per  report the patient had hypoxia there, with oxygen saturation in  the mid-80s this improved with supplemental oxygen.  Pt. exhibits  gagging behaviors and spits bolus into emesis basin stating "I  can't swallow." Type of Study: Modified Barium Swallowing Study Reason for Referral: Objectively evaluate swallowing function Previous Swallow Assessment: BSE completed this am (see report).   01/30/13 Bedside swallow evaluation- Normal, Dys 3 (due to  dentures) with thin liquids recommended.  Not followed for  swallowing on Rehab.; Esophagram on 02/12/13 was normal, with no  aspiration or penetration and no evidence of hiatal hernia.  13  mm tablet passed without delay. Diet Prior to this Study: Regular;Thin liquids Temperature Spikes Noted: No Respiratory Status: Nasal cannula History of Recent Intubation: No Behavior/Cognition: Alert;Cooperative;Pleasant mood Oral Cavity - Dentition: Dentures, top;Missing dentition Oral Motor / Sensory Function: Impaired - see Bedside swallow  eval Self-Feeding Abilities: Able to feed self Patient Positioning: Upright in chair Baseline Vocal Quality: Clear Volitional Cough: Strong Volitional Swallow: Able to elicit Anatomy: Within functional limits Pharyngeal Secretions: Not observed secondary MBS    Reason for Referral Objectively evaluate swallowing function   Oral Phase Oral Preparation/Oral Phase Oral Phase: Impaired Oral - Pudding Oral - Pudding Teaspoon: Piecemeal swallowing Oral - Solids Oral - Puree: Piecemeal swallowing   Pharyngeal Phase Pharyngeal Phase Pharyngeal Phase: Within functional limits  Cervical Esophageal Phase    GO    Cervical Esophageal Phase Cervical Esophageal Phase: Vicente Masson T 03/19/2013, 4:51 PM      ASSESSMENT AND PLAN 1. S/P prior MI, complicated by stroke, pericarditis, subsequent purulent effusion with hemodynamic collapse - s/p CABG x 1 and repair of the pseudoaneurysm . Cardiac enzymes persistenly high. Sed rate and CRP are very high. 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  Will start empiric prednisone.  2. Swallowing disorder - speech therapy and GI have seen.  GI did not feel that he needed endoscopy.  Continue with speech therapy recommendation. 3. CAD - remote stents - no active chest pain. Troponins elevated.Will continue to follow.  4. CVA - still with left arm weakness .  5. Leukocytosis ? Etiology. Consider Dresslers.  6. Pericardial effusion, moderate. Will continue ASA and Plavix  7. Diarrhea.  Positive for C. difficile.  On metronidazole 8. LV systolic dysfunction with EF 25-30%.  Blood pressure is too low to tolerate ACE at this time.  We will also cut back on dose of carvedilol 9. Deconditioning. Will ask Cardiac rehab to ambulate.     Signed, Cassell Clement  MD

## 2013-03-21 NOTE — Progress Notes (Signed)
CARDIAC REHAB PHASE I   PRE:  Rate/Rhythm: 105 sinus tach  BP:  Supine:   Sitting: 93/68  Standing:    SaO2: 100% ra  MODE:  Ambulation: 200 ft   POST:  Rate/Rhythem: 116 sinus tach  BP:  Supine:   Sitting: 83/55 sitting, repeat 81/60 sitting asymptomatic  Standing:    SaO2: 94% ra  1428-1508  Pt ambuylated in hallway without difficulty slow steady gait x2 assist using rolling walker.  Denies chest pain, dyspnea or dizziness.  Pt had BMx1 prior to ambulation.  Pt returned to chair with call light in reach.  Pt given gatorade to drink.  RN made aware of pt post ambulation BP.  [name], [place]

## 2013-03-22 LAB — CBC
HCT: 34.8 % — ABNORMAL LOW (ref 39.0–52.0)
MCH: 26.5 pg (ref 26.0–34.0)
MCHC: 32.5 g/dL (ref 30.0–36.0)
Platelets: 229 10*3/uL (ref 150–400)
RDW: 15 % (ref 11.5–15.5)
WBC: 7.3 10*3/uL (ref 4.0–10.5)

## 2013-03-22 LAB — COMPREHENSIVE METABOLIC PANEL
AST: 14 U/L (ref 0–37)
Albumin: 2.8 g/dL — ABNORMAL LOW (ref 3.5–5.2)
Alkaline Phosphatase: 138 U/L — ABNORMAL HIGH (ref 39–117)
BUN: 9 mg/dL (ref 6–23)
Chloride: 98 mEq/L (ref 96–112)
Glucose, Bld: 100 mg/dL — ABNORMAL HIGH (ref 70–99)
Potassium: 4 mEq/L (ref 3.5–5.1)
Total Bilirubin: 0.3 mg/dL (ref 0.3–1.2)
Total Protein: 7.2 g/dL (ref 6.0–8.3)

## 2013-03-22 MED ORDER — FUROSEMIDE 20 MG PO TABS: 20.0000 mg | ORAL_TABLET | Freq: Every day | ORAL | Status: DC

## 2013-03-22 NOTE — Progress Notes (Signed)
Feels better  RN noted his BP is low- he received coreg this AM  BP 99/73  Pulse 92  Temp(Src) 98.1 F (36.7 C) (Oral)  Resp 20  Ht 6\' 1"  (1.854 m)  Wt 192 lb 14.4 oz (87.5 kg)  BMI 25.46 kg/m2  SpO2 100%   Intake/Output Summary (Last 24 hours) at 03/22/13 1000 Last data filed at 03/22/13 0855  Gross per 24 hour  Intake    366 ml  Output    575 ml  Net   -209 ml    He has no edema  I think he may be a little dry from the combination of diuresis and diarrhea- will hold lasix today due to BP

## 2013-03-22 NOTE — Progress Notes (Signed)
Patient Name: [name] Date of Encounter: 03/22/2013     Active Problems:   Hypoxia    SUBJECTIVE BP is low today.  Dizzy when trying to walk. Lasix has been stopped. No significant chest pain. Still having one watery stool daily. CURRENT MEDS . aspirin EC  81 mg Oral Daily  . carvedilol  3.125 mg Oral BID WC  . clopidogrel  75 mg Oral Daily  . famotidine  20 mg Oral BID  . heparin subcutaneous  5,000 Units Subcutaneous Q8H  . metroNIDAZOLE  500 mg Oral Q8H  . potassium chloride SA  20 mEq Oral TID  . predniSONE  40 mg Oral Q breakfast  . simvastatin  40 mg Oral QHS  . sodium chloride  3 mL Intravenous Q12H    OBJECTIVE  Filed Vitals:   03/22/13 0500 03/22/13 0600 03/22/13 0700 03/22/13 0814  BP: 105/80 98/76 94/68  99/73  Pulse: 92 92 91 92  Temp: 97.8 F (36.6 C)   98.1 F (36.7 C)  TempSrc: Oral   Oral  Resp: 22 23 23 20   Height:      Weight: 192 lb 14.4 oz (87.5 kg)     SpO2: 100% 100% 99% 100%    Intake/Output Summary (Last 24 hours) at 03/22/13 1029 Last data filed at 03/22/13 0855  Gross per 24 hour  Intake    366 ml  Output    575 ml  Net   -209 ml   Filed Weights   03/20/13 0400 03/21/13 0423 03/22/13 0500  Weight: 188 lb 4.4 oz (85.4 kg) 192 lb 0.3 oz (87.1 kg) 192 lb 14.4 oz (87.5 kg)    PHYSICAL EXAM  General: Pleasant, NAD. Neuro: Alert and oriented X 3. Decreased left arm movement from old stroke. Psych: Normal affect. HEENT:  Normal  Neck: Supple without bruits or JVD. Lungs:  Resp regular and unlabored, CTA. Heart: RRR no s3, s4, or murmurs. Abdomen: Soft, non-tender, non-distended, BS + x 4.  Extremities: No clubbing, cyanosis or edema. DP/PT/Radials 2+ and equal bilaterally.  Accessory Clinical Findings  CBC  Recent Labs  03/22/13 0522  WBC 7.3  HGB 11.3*  HCT 34.8*  MCV 81.7  PLT 229   Basic Metabolic Panel  Recent Labs  03/22/13 0522  NA 133*  K 4.0  CL 98  CO2 21  GLUCOSE 100*  BUN 9  CREATININE  0.71  CALCIUM 8.9   Liver Function Tests  Recent Labs  03/22/13 0522  AST 14  ALT 14  ALKPHOS 138*  BILITOT 0.3  PROT 7.2  ALBUMIN 2.8*   No results found for this basename: LIPASE, AMYLASE,  in the last 72 hours Cardiac Enzymes  Recent Labs  03/19/13 1925  TROPONINI 2.23*   BNP No components found with this basename: POCBNP,  D-Dimer No results found for this basename: DDIMER,  in the last 72 hours Hemoglobin A1C No results found for this basename: HGBA1C,  in the last 72 hours Fasting Lipid Panel No results found for this basename: CHOL, HDL, LDLCALC, TRIG, CHOLHDL, LDLDIRECT,  in the last 72 hours Thyroid Function Tests No results found for this basename: TSH, T4TOTAL, FREET3, T3FREE, THYROIDAB,  in the last 72 hours  TELE  NSR  ECG    Radiology/Studies  Dg Chest 2 View  03/03/2013   CLINICAL DATA:  52 year old male with chest soreness. Left upper extremity weakness. Initial encounter. Recent CABG and stroke.  EXAM: CHEST  2 VIEW  COMPARISON:  02/04/2013 and earlier.  FINDINGS: Sequelae of median sternotomy. Right IJ central line removed. Lung volumes a. Baseline. No pneumothorax, pulmonary edema, pleural effusion or consolidation. Mild linear scarring or atelectasis at the left lung base. Visualized tracheal air column is within normal limits. Stable visualized osseous structures.  IMPRESSION: Minor left lung base atelectasis or scarring. No acute cardiopulmonary abnormality.   Electronically Signed   By: Augusto Gamble M.D.   On: 03/03/2013 10:18   Ct Angio Chest Pe W/cm &/or Wo Cm  03/18/2013   CLINICAL DATA:  Shortness of breath and chest pain.  EXAM: CT ANGIOGRAPHY CHEST WITH CONTRAST  TECHNIQUE: Multidetector CT imaging of the chest was performed using the standard protocol during bolus administration of intravenous contrast. Multiplanar CT image reconstructions including MIPs were obtained to evaluate the vascular anatomy.  CONTRAST:  63mL OMNIPAQUE IOHEXOL 350  MG/ML SOLN  COMPARISON:  Chest x-ray today.  FINDINGS: Evidence of subtle dependent bibasilar density likely atelectasis. Remainder of the lungs are clear. Median sternotomy wires are present. Findings suggesting a stent over the left anterior descending coronary artery. Pulmonary arterial system is well opacified in demonstrates no evidence of emboli. There is mild cardiomegaly. There is a significant pericardial effusion with somewhat loculated component of this pericardial effusion over the right anterior aspect of the pericardium with mild mass effect on the right atrium. Some of these changes may be related to patient's history of recent ventricular aneurysm resection. There is a 1.1 cm AP window lymph node. There are a few other small sub cm mediastinal and right pericardial phrenic lymph nodes. There is no hilar or axillary adenopathy.  Images through the upper abdomen are unremarkable. There is mild spondylosis of the spine.  Review of the MIP images confirms the above findings.  IMPRESSION: No evidence of pulmonary embolism.  Cardiomegaly with moderate pericardial effusion with somewhat loculated component of this pericardial effusion adjacent the right atrium with mild mass effect on the right atrium. This may be related patient's recent ventricular aneurysm resection.  Mild dependent bibasilar atelectatic change.  1.1 cm AP window lymph node likely reactive in nature.   Electronically Signed   By: Elberta Fortis M.D.   On: 03/18/2013 21:14   Dg Chest Port 1 View  03/18/2013   CLINICAL DATA:  Short of breath  EXAM: PORTABLE CHEST - 1 VIEW  COMPARISON:  03/03/2013  FINDINGS: Cardiac enlargement.  Pulmonary vascularity is normal.  Mild bibasilar atelectasis/infiltrate, increased from the prior study. Negative for effusion. Prior median sternotomy.  IMPRESSION: Mild bibasilar atelectasis/ infiltrate.  Negative for heart failure.   Electronically Signed   By: Marlan Palau M.D.   On: 03/18/2013 11:21   Dg  Swallowing Func-speech Pathology  03/19/2013   Lenor Derrick, CCC-SLP     03/19/2013  4:52 PM Objective Swallowing Evaluation: Modified Barium Swallowing Study   Patient Details  Name: [name] MRN: [id number] Date of Birth: Aug 09, 1960  Today's Date: 03/19/2013 Time: 1340-1415 SLP Time Calculation (min): 35 min  Past Medical History:  Past Medical History  Diagnosis Date  . Coronary artery disease     a. s/p MI in 2009 in [place] with stenting of the LCX and LAD;  b.  12/2012 NSTEMI/CAD: LM nl, LAD patent prox stent, LCX 50-70 isr  (FFR 0.84), RCA dom, 15m, EF 40-45%, sev basal to mid inf HK to  AK-->Med Rx.  . Tobacco abuse     a. quit 12/2012.  . Ischemic cardiomyopathy  a. 12/2012 Ech: EF 40-45%, Gr 1 DD.  Marland Kitchen Hyperlipidemia   . Pericardial effusion   . CVA (cerebral infarction)   . 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 Stroke   . Hypoxia 03/18/2013  . Myocardial infarction   . Shortness of breath    Past Surgical History:  Past Surgical History  Procedure Laterality Date  . Coronary angioplasty with stent placement  2000's X 2    "1 + 1" (01/06/2013)  . Cardiac catheterization  01/06/2013  . Coronary artery bypass graft N/A 01/28/2013    Procedure: CORONARY ARTERY BYPASS GRAFTING (CABG);  Surgeon:  Loreli Slot, MD;  Location: [facility] OR;  Service: Open Heart  Surgery;  Laterality: N/A;  CABG x one, using left greater  saphenous vein harvested endoscopically  . Intraoperative transesophageal echocardiogram N/A 01/28/2013    Procedure: INTRAOPERATIVE TRANSESOPHAGEAL ECHOCARDIOGRAM;   Surgeon: Loreli Slot, MD;  Location: [facility] OR;  Service:  Open Heart Surgery;  Laterality: N/A;  . Ventricular aneurysm resection N/A 01/28/2013    Procedure: LEFT VENTRICULAR ANEURYSM REPAIR;  Surgeon: Loreli Slot, MD;  Location: [facility] OR;  Service: Open Heart Surgery;   Laterality: N/A;   HPI:  Patient presents from the cardiology clinic.  Per report the  patient had hypoxia there, with oxygen  saturation in the mid-80s  this improved with supplemental oxygen.  Pt. exhibits gagging  behaviors and spits bolus into emesis basin stating "I can't  swallow."     Assessment / Plan / Recommendation Clinical Impression  Dysphagia Diagnosis: Within Functional Limits (Gagging behaviors  inconsistent) Clinical impression: Pt. was able to swallow thin and nectar  thick liquids, swallowed a 13mm barium tablet with thin liquid,  and chewed and swallowed a graham cracker without difficulty.   However, he exhibited significant gagging behaviors (with pudding  and purees) before swallowing, while the bolus was in the  anterior portion of the oral cavity, then spat out the bolus into  an emesis basin.  The pt. began coughing and bringing up thick,  yellow mucous (noted there was no barium in the mucous).  The  esophagus was completely clear after all boluses, and there was  no aspiration, penetration, laryngeal or esophageal residue.  ?  if the is psychosomatic vs. a lower GI issue. Pt. denies being  under stress or having any emotional issues.    Treatment Recommendation  No treatment recommended at this time    Diet Recommendation Dysphagia 3 (Mechanical Soft);Thin liquid   Liquid Administration via: Cup;Straw Medication Administration: Whole meds with liquid Supervision: Patient able to self feed;Intermittent supervision  to cue for compensatory strategies    Other  Recommendations Recommended Consults: Consider GI  evaluation   Follow Up Recommendations  None    Frequency and Duration        Pertinent Vitals/Pain Less gagging with use of chin tuck.    SLP Swallow Goals  n/a   General HPI: Patient presents from the cardiology clinic.  Per  report the patient had hypoxia there, with oxygen saturation in  the mid-80s this improved with supplemental oxygen.  Pt. exhibits  gagging behaviors and spits bolus into emesis basin stating "I  can't swallow." Type of Study: Modified Barium Swallowing Study Reason for Referral:  Objectively evaluate swallowing function Previous Swallow Assessment: BSE completed this am (see report).   01/30/13 Bedside swallow evaluation- Normal, Dys 3 (due to  dentures) with  thin liquids recommended.  Not followed for  swallowing on Rehab.; Esophagram on 02/12/13 was normal, with no  aspiration or penetration and no evidence of hiatal hernia.  13  mm tablet passed without delay. Diet Prior to this Study: Regular;Thin liquids Temperature Spikes Noted: No Respiratory Status: Nasal cannula History of Recent Intubation: No Behavior/Cognition: Alert;Cooperative;Pleasant mood Oral Cavity - Dentition: Dentures, top;Missing dentition Oral Motor / Sensory Function: Impaired - see Bedside swallow  eval Self-Feeding Abilities: Able to feed self Patient Positioning: Upright in chair Baseline Vocal Quality: Clear Volitional Cough: Strong Volitional Swallow: Able to elicit Anatomy: Within functional limits Pharyngeal Secretions: Not observed secondary MBS    Reason for Referral Objectively evaluate swallowing function   Oral Phase Oral Preparation/Oral Phase Oral Phase: Impaired Oral - Pudding Oral - Pudding Teaspoon: Piecemeal swallowing Oral - Solids Oral - Puree: Piecemeal swallowing   Pharyngeal Phase Pharyngeal Phase Pharyngeal Phase: Within functional limits  Cervical Esophageal Phase    GO    Cervical Esophageal Phase Cervical Esophageal Phase: Vicente Masson T 03/19/2013, 4:51 PM     ASSESSMENT AND PLAN 1. S/P prior MI, complicated by stroke, pericarditis, subsequent purulent effusion with hemodynamic collapse - s/p CABG x 1 and repair of the pseudoaneurysm . Cardiac enzymes persistenly high. Sed rate and CRP are very high. 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 Have started empiric prednisone.  2. Swallowing disorder - speech therapy and GI have seen. GI did not feel that he needed endoscopy. Continue with speech therapy recommendation.  3. CAD  - remote stents - no active chest pain. Troponins elevated.Will continue to follow.  4. CVA - still with left arm weakness .  5. Leukocytosis ? Etiology. Consider Dresslers.  6. Pericardial effusion, moderate. Will continue ASA and Plavix  7. Diarrhea. Positive for C. difficile. On metronidazole  8. LV systolic dysfunction with EF 25-30%. Blood pressure is too low to tolerate ACE at this time. We will also cut back on dose of carvedilol. Have stopped lasix today.  9. Deconditioning.    Appreciate Dr. Sunday Corn help.    Signed, Cassell Clement  MD

## 2013-03-23 NOTE — Progress Notes (Signed)
CARDIAC REHAB PHASE I   PRE:  Rate/Rhythm: 91 SR    BP: sitting 95/63, standing 88/69, sitting again with dizziness 84/68    SaO2: 99 RA  MODE:  Ambulation: 160 ft   POST:  Rate/Rhythm: 101 ST    BP: sitting 88/68     SaO2: 97 RA  Pt on BSC upon arrival. Feeling stronger. Some dizziness upon standing. BP low but stable. Able to ambulate today with RW. Steady. Tires easily and sts that at home he had been walking 120 ft with HHPT. Exhausted after walk. To chair by sink to shave after walk. Pt is weak and needs assistance with ADLs. Will f/u. 4098-1191   Elissa Lovett [place] CES, ACSM 03/23/2013 11:13 AM

## 2013-03-23 NOTE — Progress Notes (Signed)
Clinical Social Work Department BRIEF PSYCHOSOCIAL ASSESSMENT 03/23/2013  Patient:  [name], [name]     Account Number:  [id number]     Admit date:  03/18/2013  Clinical Social Worker:  Varney Biles  Date/Time:  03/23/2013 04:12 PM  Referred by:  Physician  Date Referred:  03/23/2013 Referred for  SNF Placement   Other Referral:   Interview type:  Patient Other interview type:    PSYCHOSOCIAL DATA Living Status:  FAMILY Admitted from facility:   Level of care:   Primary support name:  [name] [phone]) Primary support relationship to patient:  NONE Degree of support available:   [name] [phone]) is pt's significant other, and pt's daughter and granddaughter provide support.    CURRENT CONCERNS Current Concerns  Post-Acute Placement   Other Concerns:    SOCIAL WORK ASSESSMENT / PLAN CSW received consult for SNF placement. Pt is having his daughter mail his Medicaid application, but as he currently does not have a payer source CSW explained letters of guarantee (LOG) and that the hospital could pay for short-term SNF stay for pt. However, CSW explained that there are few facilities that accept LOGs, and they are often located outside of [facility]. Pt expressed understanding of this, and gave CSW permission to send his clinicals to facilities to see who might be able to offer short-term SNF. CSW has done this and will check in with pt when bed offers are made.   Assessment/plan status:  Psychosocial Support/Ongoing Assessment of Needs Other assessment/ plan:   Information/referral to community resources:   SNF.    PATIENT'S/FAMILY'S RESPONSE TO PLAN OF CARE: Pt very friendly, and daughter and granddaughter were visiting when CSW completed assessment. Pt answered CSW questions and is understanding of the limitations for placement. CSW continues to provide support and advocate for pt to have short-term SNF before returning home.        Maryclare Labrador, MSW, [facility] Clinical Social Worker [phone]

## 2013-03-23 NOTE — Evaluation (Signed)
Occupational Therapy Evaluation Patient Details Name: [name] MRN: [id number] DOB: 1961/03/15 Today's Date: 03/23/2013 Time: 4098-1191 OT Time Calculation (min): 24 min  OT Assessment / Plan / Recommendation History of present illness Pt adm with chest pain.  Pt recently dc'd after MI, CABG (2 months ago), rupture of pseudoaneurysm, and rt CVA. Pt found to be +C-diff   Clinical Impression   This 52 yo male admitted with above presents to acute OT with decreased endurance for activity, DOE, decreased independence with LBADLs. Will benefit from acute OT with follow up at SNF to get back to an Independent/mod I level.    OT Assessment  Patient needs continued OT Services    Follow Up Recommendations  SNF    Barriers to Discharge Decreased caregiver support    Equipment Recommendations  None recommended by OT       Frequency  Min 2X/week    Precautions / Restrictions Precautions Precautions: Fall Precaution Comments: BP low; however did not c/o dizziness Restrictions Weight Bearing Restrictions: No   Pertinent Vitals/Pain BP low; however no c/o dizziness    ADL  Eating/Feeding: Independent Where Assessed - Eating/Feeding: Chair Grooming: Set up Where Assessed - Grooming: Unsupported sitting Upper Body Bathing: Set up Where Assessed - Upper Body Bathing: Unsupported sitting Lower Body Bathing: Moderate assistance Where Assessed - Lower Body Bathing: Unsupported sit to stand Upper Body Dressing: Set up Where Assessed - Upper Body Dressing: Unsupported sitting Lower Body Dressing: Minimal assistance;Moderate assistance Where Assessed - Lower Body Dressing: Unsupported sit to stand Toilet Transfer: Supervision/safety Toilet Transfer Method: Sit to Barista:  (Chair>down hallway>back to room>sit in Medical illustrator) Toileting - Architect and Hygiene: Supervision/safety Where Assessed - Engineer, mining and Hygiene: Sit to  stand from 3-in-1 or toilet Equipment Used: Gait belt (quad cane) Transfers/Ambulation Related to ADLs: S with quad cane sti<>stand; min guard A for ambulation with quad cane ADL Comments: struggles minimally to get socks off, but cannot get them back on; mild DOE noted with activity    OT Diagnosis: Generalized weakness  OT Problem List: Decreased activity tolerance;Decreased knowledge of use of DME or AE;Cardiopulmonary status limiting activity OT Treatment Interventions: Self-care/ADL training;Patient/family education;Balance training;DME and/or AE instruction;Therapeutic activities;Energy conservation   OT Goals(Current goals can be found in the care plan section) Acute Rehab OT Goals Patient Stated Goal: rehab then home OT Goal Formulation: With patient Time For Goal Achievement: 04/06/13 Potential to Achieve Goals: Good  Visit Information  Last OT Received On: 03/23/13 Assistance Needed: +1 History of Present Illness: Pt adm with chest pain.  Pt recently dc'd after MI, CABG (2 months ago), rupture of pseudoaneurysm, and rt CVA. Pt found to be +C-diff       Prior Functioning     Home Living Family/patient expects to be discharged to:: Private residence Available Help at Discharge: Family;Available 24 hours/day (for next 2 weeks only) Type of Home: Apartment Home Access: Level entry Home Layout: Two level;Bed/bath upstairs Alternate Level Stairs-Number of Steps: 10-12 steps to second floor, patients bedroom and full bathroom located on second floor.  Alternate Level Stairs-Rails: Can reach both Home Equipment: Tub bench Prior Function Level of Independence: Needs assistance Gait / Transfers Assistance Needed: amb mod I with quad cane ADL's / Homemaking Assistance Needed: Min A LB ADLs Communication Communication: No difficulties Dominant Hand: Right         Vision/Perception Vision - History Patient Visual Report: No change from baseline   Cognition  Cognition Arousal/Alertness: Awake/alert Behavior During Therapy: WFL for tasks assessed/performed Overall Cognitive Status: Within Functional Limits for tasks assessed    Extremity/Trunk Assessment Upper Extremity Assessment Upper Extremity Assessment: Overall WFL for tasks assessed     Mobility Bed Mobility Details for Bed Mobility Assistance: Pt up in chair upon arrival Transfers Transfers: Sit to Stand;Stand to Sit Sit to Stand: 5: Supervision;With upper extremity assist;With armrests;From chair/3-in-1 Stand to Sit: 5: Supervision;With upper extremity assist;With armrests;To chair/3-in-1           End of Session OT - End of Session Equipment Utilized During Treatment:  (quad cane) Activity Tolerance: Patient limited by fatigue Patient left: in chair;with call bell/phone within reach       Evette Georges [phone] 03/23/2013, 12:25 PM

## 2013-03-23 NOTE — Progress Notes (Signed)
Physical Therapy Treatment Patient Details Name: [name] MRN: [id number] DOB: [birth year] Today's Date: 03/23/2013 Time: 0102-7253 PT Time Calculation (min): 15 min  PT Assessment / Plan / Recommendation  History of Present Illness Pt adm with chest pain.  Pt recently dc'd after MI, CABG (2 months ago), rupture of pseudoaneurysm, and rt CVA. Pt found to be +C-diff   PT Comments   Patient demonstrates unsteady gait, and loss of balance with high level balance activities.  Patient lives alone.  Also, lives in split-level home with 10 stairs to bed/bath.  Recommend ST-SNF for continued therapy at discharge prior to returning home alone.  Follow Up Recommendations  SNF     Does the patient have the potential to tolerate intense rehabilitation     Barriers to Discharge        Equipment Recommendations  None recommended by PT    Recommendations for Other Services    Frequency Min 3X/week   Progress towards PT Goals Progress towards PT goals: Progressing toward goals  Plan Discharge plan needs to be updated    Precautions / Restrictions Precautions Precautions: Fall Precaution Comments: BP low; however did not c/o dizziness Restrictions Weight Bearing Restrictions: No   Pertinent Vitals/Pain     Mobility  Bed Mobility Bed Mobility: Sit to Supine Sit to Supine: 6: Modified independent (Device/Increase time);With rail;HOB flat Details for Bed Mobility Assistance: Pt up in chair upon arrival Transfers Transfers: Sit to Stand;Stand to Sit Sit to Stand: 4: Min guard;With upper extremity assist;With armrests;From chair/3-in-1 Stand to Sit: 4: Min guard;With upper extremity assist;To bed Details for Transfer Assistance: Verbal cues for hand placement.  Assist for safety/balance Ambulation/Gait Ambulation/Gait Assistance: 4: Min assist Ambulation Distance (Feet): 84 Feet Assistive device: Large base quad cane Ambulation/Gait Assistance Details: Verbal cues to look up during  gait.  Cues to move at slow, safe pace.  Patient with unsteady gait. Gait Pattern: Step-through pattern;Decreased stride length Gait velocity: Slow gait speed      PT Goals (current goals can now be found in the care plan section) Acute Rehab PT Goals Patient Stated Goal: rehab then home  Visit Information  Last PT Received On: 03/23/13 Assistance Needed: +1 History of Present Illness: Pt adm with chest pain.  Pt recently dc'd after MI, CABG (2 months ago), rupture of pseudoaneurysm, and rt CVA. Pt found to be +C-diff    Subjective Data  Subjective: "I'm tired today" Patient Stated Goal: rehab then home   Cognition  Cognition Arousal/Alertness: Awake/alert Behavior During Therapy: WFL for tasks assessed/performed Overall Cognitive Status: Within Functional Limits for tasks assessed    Balance  Balance Balance Assessed: Yes Static Standing Balance Static Standing - Balance Support: Right upper extremity supported Static Standing - Level of Assistance: 5: Stand by assistance Single Leg Stance - Right Leg: 1 (1 second - unable to maintain balance) Single Leg Stance - Left Leg: 1 (1 second - unable to maintain balance) Rhomberg - Eyes Opened: 20 Rhomberg - Eyes Closed: 20 (Increased sway) High Level Balance High Level Balance Activites: Turns;Sudden stops;Head turns High Level Balance Comments: Decreased balance with high level balance activities  End of Session PT - End of Session Equipment Utilized During Treatment: Gait belt Activity Tolerance: Patient limited by fatigue Patient left: in bed;with call bell/phone within reach Nurse Communication: Mobility status   GP     Vena Austria 03/23/2013, 2:46 PM Durenda Hurt. Renaldo Fiddler, [facility] Acute Rehab Services Pager [phone]

## 2013-03-23 NOTE — Progress Notes (Signed)
Patient Name: [name] Date of Encounter: 03/23/2013     Active Problems:   Hypoxia    SUBJECTIVE  BP still running low. No significant chest pain.  CURRENT MEDS . aspirin EC  81 mg Oral Daily  . carvedilol  3.125 mg Oral BID WC  . clopidogrel  75 mg Oral Daily  . famotidine  20 mg Oral BID  . heparin subcutaneous  5,000 Units Subcutaneous Q8H  . metroNIDAZOLE  500 mg Oral Q8H  . potassium chloride SA  20 mEq Oral TID  . predniSONE  40 mg Oral Q breakfast  . simvastatin  40 mg Oral QHS  . sodium chloride  3 mL Intravenous Q12H    OBJECTIVE  Filed Vitals:   03/22/13 1953 03/22/13 2000 03/22/13 2332 03/23/13 0515  BP: 100/71 94/73 103/62 104/71  Pulse: 87 88 85 87  Temp: 98.1 F (36.7 C)  98.5 F (36.9 C) 98.7 F (37.1 C)  TempSrc: Oral  Oral Oral  Resp: 28 25 22 26   Height:      Weight:    195 lb 12.3 oz (88.8 kg)  SpO2: 100% 100% 100% 98%    Intake/Output Summary (Last 24 hours) at 03/23/13 0837 Last data filed at 03/23/13 0600  Gross per 24 hour  Intake    300 ml  Output    425 ml  Net   -125 ml   Filed Weights   03/21/13 0423 03/22/13 0500 03/23/13 0515  Weight: 192 lb 0.3 oz (87.1 kg) 192 lb 14.4 oz (87.5 kg) 195 lb 12.3 oz (88.8 kg)    PHYSICAL EXAM  General: Pleasant, NAD. Neuro: Alert and oriented X 3. Moves all extremities spontaneously. Psych: Normal affect. HEENT:  Normal  Neck: Supple without bruits or JVD. Lungs:  Resp regular and unlabored, CTA. Heart: RRR no s3, s4, or murmurs. Abdomen: Soft, non-tender, non-distended, BS + x 4.  Extremities: No clubbing, cyanosis or edema. DP/PT/Radials 2+ and equal bilaterally.  Accessory Clinical Findings  CBC  Recent Labs  03/22/13 0522  WBC 7.3  HGB 11.3*  HCT 34.8*  MCV 81.7  PLT 229   Basic Metabolic Panel  Recent Labs  03/22/13 0522  NA 133*  K 4.0  CL 98  CO2 21  GLUCOSE 100*  BUN 9  CREATININE 0.71  CALCIUM 8.9   Liver Function Tests  Recent Labs  03/22/13 0522  AST 14  ALT 14  ALKPHOS 138*  BILITOT 0.3  PROT 7.2  ALBUMIN 2.8*   No results found for this basename: LIPASE, AMYLASE,  in the last 72 hours Cardiac Enzymes No results found for this basename: CKTOTAL, CKMB, CKMBINDEX, TROPONINI,  in the last 72 hours BNP No components found with this basename: POCBNP,  D-Dimer No results found for this basename: DDIMER,  in the last 72 hours Hemoglobin A1C No results found for this basename: HGBA1C,  in the last 72 hours Fasting Lipid Panel No results found for this basename: CHOL, HDL, LDLCALC, TRIG, CHOLHDL, LDLDIRECT,  in the last 72 hours Thyroid Function Tests No results found for this basename: TSH, T4TOTAL, FREET3, T3FREE, THYROIDAB,  in the last 72 hours  TELE  NSR  ECG    Radiology/Studies  Dg Chest 2 View  03/03/2013   CLINICAL DATA:  52 year old male with chest soreness. Left upper extremity weakness. Initial encounter. Recent CABG and stroke.  EXAM: CHEST  2 VIEW  COMPARISON:  02/04/2013 and earlier.  FINDINGS: Sequelae of median sternotomy. Right  IJ central line removed. Lung volumes a. Baseline. No pneumothorax, pulmonary edema, pleural effusion or consolidation. Mild linear scarring or atelectasis at the left lung base. Visualized tracheal air column is within normal limits. Stable visualized osseous structures.  IMPRESSION: Minor left lung base atelectasis or scarring. No acute cardiopulmonary abnormality.   Electronically Signed   By: Augusto Gamble M.D.   On: 03/03/2013 10:18   Ct Angio Chest Pe W/cm &/or Wo Cm  03/18/2013   CLINICAL DATA:  Shortness of breath and chest pain.  EXAM: CT ANGIOGRAPHY CHEST WITH CONTRAST  TECHNIQUE: Multidetector CT imaging of the chest was performed using the standard protocol during bolus administration of intravenous contrast. Multiplanar CT image reconstructions including MIPs were obtained to evaluate the vascular anatomy.  CONTRAST:  63mL OMNIPAQUE IOHEXOL 350 MG/ML SOLN   COMPARISON:  Chest x-ray today.  FINDINGS: Evidence of subtle dependent bibasilar density likely atelectasis. Remainder of the lungs are clear. Median sternotomy wires are present. Findings suggesting a stent over the left anterior descending coronary artery. Pulmonary arterial system is well opacified in demonstrates no evidence of emboli. There is mild cardiomegaly. There is a significant pericardial effusion with somewhat loculated component of this pericardial effusion over the right anterior aspect of the pericardium with mild mass effect on the right atrium. Some of these changes may be related to patient's history of recent ventricular aneurysm resection. There is a 1.1 cm AP window lymph node. There are a few other small sub cm mediastinal and right pericardial phrenic lymph nodes. There is no hilar or axillary adenopathy.  Images through the upper abdomen are unremarkable. There is mild spondylosis of the spine.  Review of the MIP images confirms the above findings.  IMPRESSION: No evidence of pulmonary embolism.  Cardiomegaly with moderate pericardial effusion with somewhat loculated component of this pericardial effusion adjacent the right atrium with mild mass effect on the right atrium. This may be related patient's recent ventricular aneurysm resection.  Mild dependent bibasilar atelectatic change.  1.1 cm AP window lymph node likely reactive in nature.   Electronically Signed   By: Elberta Fortis M.D.   On: 03/18/2013 21:14   Dg Chest Port 1 View  03/18/2013   CLINICAL DATA:  Short of breath  EXAM: PORTABLE CHEST - 1 VIEW  COMPARISON:  03/03/2013  FINDINGS: Cardiac enlargement.  Pulmonary vascularity is normal.  Mild bibasilar atelectasis/infiltrate, increased from the prior study. Negative for effusion. Prior median sternotomy.  IMPRESSION: Mild bibasilar atelectasis/ infiltrate.  Negative for heart failure.   Electronically Signed   By: Marlan Palau M.D.   On: 03/18/2013 11:21   Dg Swallowing  Func-speech Pathology  03/19/2013   Lenor Derrick, CCC-SLP     03/19/2013  4:52 PM Objective Swallowing Evaluation: Modified Barium Swallowing Study   Patient Details  Name: [name] MRN: [id number] Date of Birth: Oct 21, 1960  Today's Date: 03/19/2013 Time: 1340-1415 SLP Time Calculation (min): 35 min  Past Medical History:  Past Medical History  Diagnosis Date  . Coronary artery disease     a. s/p MI in 2009 in [place] with stenting of the LCX and LAD;  b.  12/2012 NSTEMI/CAD: LM nl, LAD patent prox stent, LCX 50-70 isr  (FFR 0.84), RCA dom, 143m, EF 40-45%, sev basal to mid inf HK to  AK-->Med Rx.  . Tobacco abuse     a. quit 12/2012.  . Ischemic cardiomyopathy     a. 12/2012 Ech: EF 40-45%, Gr 1 DD.  Marland Kitchen  Hyperlipidemia   . Pericardial effusion   . CVA (cerebral infarction)   . 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 Stroke   . Hypoxia 03/18/2013  . Myocardial infarction   . Shortness of breath    Past Surgical History:  Past Surgical History  Procedure Laterality Date  . Coronary angioplasty with stent placement  2000's X 2    "1 + 1" (01/06/2013)  . Cardiac catheterization  01/06/2013  . Coronary artery bypass graft N/A 01/28/2013    Procedure: CORONARY ARTERY BYPASS GRAFTING (CABG);  Surgeon:  Loreli Slot, MD;  Location: [facility] OR;  Service: Open Heart  Surgery;  Laterality: N/A;  CABG x one, using left greater  saphenous vein harvested endoscopically  . Intraoperative transesophageal echocardiogram N/A 01/28/2013    Procedure: INTRAOPERATIVE TRANSESOPHAGEAL ECHOCARDIOGRAM;   Surgeon: Loreli Slot, MD;  Location: [facility] OR;  Service:  Open Heart Surgery;  Laterality: N/A;  . Ventricular aneurysm resection N/A 01/28/2013    Procedure: LEFT VENTRICULAR ANEURYSM REPAIR;  Surgeon: Loreli Slot, MD;  Location: [facility] OR;  Service: Open Heart Surgery;   Laterality: N/A;   HPI:  Patient presents from the cardiology clinic.  Per report the  patient had hypoxia there, with oxygen saturation  in the mid-80s  this improved with supplemental oxygen.  Pt. exhibits gagging  behaviors and spits bolus into emesis basin stating "I can't  swallow."     Assessment / Plan / Recommendation Clinical Impression  Dysphagia Diagnosis: Within Functional Limits (Gagging behaviors  inconsistent) Clinical impression: Pt. was able to swallow thin and nectar  thick liquids, swallowed a 13mm barium tablet with thin liquid,  and chewed and swallowed a graham cracker without difficulty.   However, he exhibited significant gagging behaviors (with pudding  and purees) before swallowing, while the bolus was in the  anterior portion of the oral cavity, then spat out the bolus into  an emesis basin.  The pt. began coughing and bringing up thick,  yellow mucous (noted there was no barium in the mucous).  The  esophagus was completely clear after all boluses, and there was  no aspiration, penetration, laryngeal or esophageal residue.  ?  if the is psychosomatic vs. a lower GI issue. Pt. denies being  under stress or having any emotional issues.    Treatment Recommendation  No treatment recommended at this time    Diet Recommendation Dysphagia 3 (Mechanical Soft);Thin liquid   Liquid Administration via: Cup;Straw Medication Administration: Whole meds with liquid Supervision: Patient able to self feed;Intermittent supervision  to cue for compensatory strategies    Other  Recommendations Recommended Consults: Consider GI  evaluation   Follow Up Recommendations  None    Frequency and Duration        Pertinent Vitals/Pain Less gagging with use of chin tuck.    SLP Swallow Goals  n/a   General HPI: Patient presents from the cardiology clinic.  Per  report the patient had hypoxia there, with oxygen saturation in  the mid-80s this improved with supplemental oxygen.  Pt. exhibits  gagging behaviors and spits bolus into emesis basin stating "I  can't swallow." Type of Study: Modified Barium Swallowing Study Reason for Referral: Objectively  evaluate swallowing function Previous Swallow Assessment: BSE completed this am (see report).   01/30/13 Bedside swallow evaluation- Normal, Dys 3 (due to  dentures) with thin liquids recommended.  Not followed for  swallowing on  Rehab.; Esophagram on 02/12/13 was normal, with no  aspiration or penetration and no evidence of hiatal hernia.  13  mm tablet passed without delay. Diet Prior to this Study: Regular;Thin liquids Temperature Spikes Noted: No Respiratory Status: Nasal cannula History of Recent Intubation: No Behavior/Cognition: Alert;Cooperative;Pleasant mood Oral Cavity - Dentition: Dentures, top;Missing dentition Oral Motor / Sensory Function: Impaired - see Bedside swallow  eval Self-Feeding Abilities: Able to feed self Patient Positioning: Upright in chair Baseline Vocal Quality: Clear Volitional Cough: Strong Volitional Swallow: Able to elicit Anatomy: Within functional limits Pharyngeal Secretions: Not observed secondary MBS    Reason for Referral Objectively evaluate swallowing function   Oral Phase Oral Preparation/Oral Phase Oral Phase: Impaired Oral - Pudding Oral - Pudding Teaspoon: Piecemeal swallowing Oral - Solids Oral - Puree: Piecemeal swallowing   Pharyngeal Phase Pharyngeal Phase Pharyngeal Phase: Within functional limits  Cervical Esophageal Phase    GO    Cervical Esophageal Phase Cervical Esophageal Phase: Vicente Masson T 03/19/2013, 4:51 PM     ASSESSMENT AND PLAN 1. S/P prior MI, complicated by stroke, pericarditis, subsequent purulent effusion with hemodynamic collapse - s/p CABG x 1 and repair of the pseudoaneurysm . Cardiac enzymes persistenly high. Sed rate and CRP are very high. 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 Have started empiric prednisone.  2. Swallowing disorder - speech therapy and GI have seen. GI did not feel that he needed endoscopy. Continue with speech therapy recommendation.  3. CAD - remote  stents - no active chest pain. Troponins elevated.Will continue to follow.  4. CVA - still with left arm weakness .  5. Leukocytosis ? Etiology. Consider Dresslers.  6. Pericardial effusion, moderate. Will continue ASA and Plavix  7. Diarrhea. Positive for C. difficile. On metronidazole  8. LV systolic dysfunction with EF 25-30%. Blood pressure is too low to tolerate ACE at this time. We will also cut back on dose of carvedilol. Remains off lasix. 9. Deconditioning. Resume walking in hall.    Signed, Cassell Clement  MD

## 2013-03-23 NOTE — Progress Notes (Signed)
Clinical Social Work Department CLINICAL SOCIAL WORK PLACEMENT NOTE 03/23/2013  Patient:  [name], [name]  Account Number:  [id number] Admit date:  03/18/2013  Clinical Social Worker:  Maryclare Labrador, [name]  Date/time:  03/23/2013 04:18 PM  Clinical Social Work is seeking post-discharge placement for this patient at the following level of care:   SKILLED NURSING   (*CSW will update this form in Epic as items are completed)   N/A - pt gave CSW permission to send clinicals to facilities within larger geographic area Patient/family provided with [facility] Department of Clinical Social Work's list of facilities offering this level of care within the geographic area requested by the patient (or if unable, by the patient's family).  03/23/13  Patient/family informed of their freedom to choose among providers that offer the needed level of care, that participate in Medicare, Medicaid or managed care program needed by the patient, have an available bed and are willing to accept the patient.  N/A Patient/family informed of [facility]' ownership interest in [facility], as well as of the fact that they are under no obligation to receive care at this facility.  PASARR submitted to EDS on EXISTING PASARR number received from EDS on EXISTING  FL2 transmitted to all facilities in geographic area requested by pt/family on  03/23/2013 FL2 transmitted to all facilities within larger geographic area on 03/23/2013  Patient informed that his/her managed care company has contracts with or will negotiate with  certain facilities, including the following:     Patient/family informed of bed offers received:   Patient chooses bed at  Physician recommends and patient chooses bed at    Patient to be transferred to  on   Patient to be transferred to facility by   The following physician request were entered in Epic:   Additional Comments:   Maryclare Labrador, MSW, [facility] Clinical Social  Worker [phone]

## 2013-03-24 LAB — CBC
HCT: 36.4 % — ABNORMAL LOW (ref 39.0–52.0)
Hemoglobin: 11.9 g/dL — ABNORMAL LOW (ref 13.0–17.0)
RBC: 4.5 MIL/uL (ref 4.22–5.81)
RDW: 14.9 % (ref 11.5–15.5)
WBC: 9.5 10*3/uL (ref 4.0–10.5)

## 2013-03-24 LAB — BASIC METABOLIC PANEL
CO2: 21 mEq/L (ref 19–32)
Chloride: 99 mEq/L (ref 96–112)
Creatinine, Ser: 0.69 mg/dL (ref 0.50–1.35)
GFR calc Af Amer: >90 mL/min (ref >90)
Potassium: 3.8 mEq/L (ref 3.5–5.1)
Sodium: 133 mEq/L — ABNORMAL LOW (ref 135–145)

## 2013-03-24 LAB — SEDIMENTATION RATE: Sed Rate: 10 mm/hr (ref 0–16)

## 2013-03-24 NOTE — Progress Notes (Signed)
Occupational Therapy Treatment Patient Details Name: [name] MRN: [id number] DOB: 1960-10-18 Today's Date: 03/24/2013 Time: 4098-1191 OT Time Calculation (min): 12 min  OT Assessment / Plan / Recommendation  History of present illness Pt adm with chest pain.  Pt recently dc'd after MI, CABG (2 months ago), rupture of pseudoaneurysm, and rt CVA. Pt found to be +C-diff   OT comments  Pt. Able to verbalize several energy conservation tech., also worked on incorporating b hands/ues in tasks to promote L ue usage.  Note: pt. With bed offer and will likely be d/c tom.  Pt. Eager for con't. therapy                           Frequency Min 2X/week   Progress towards OT Goals Progress towards OT goals: Progressing toward goals  Plan Discharge plan remains appropriate    Precautions / Restrictions Precautions Precautions: Fall   Pertinent Vitals/Pain 0/10, denies pain    ADL  Lower Body Dressing: Performed;Supervision/safety Where Assessed - Lower Body Dressing: Unsupported sitting ADL Comments: encouragement and education for b hand integration for donning/doffing socks and other lb garments along with crossing l/r leg over for reaching b les better.  reviewed energy conservation tech. pt. able to verbalize several examples.       OT Goals(current goals can now be found in the care plan section)    Visit Information  Last OT Received On: 03/24/13 History of Present Illness: Pt adm with chest pain.  Pt recently dc'd after MI, CABG (2 months ago), rupture of pseudoaneurysm, and rt CVA. Pt found to be +C-diff                 Cognition  Cognition Arousal/Alertness: Awake/alert Behavior During Therapy: WFL for tasks assessed/performed Overall Cognitive Status: Within Functional Limits for tasks assessed    Mobility  Bed Mobility Details for Bed Mobility Assistance: up in chair upon arrival Transfers Details for Transfer Assistance: declined out of chair                End of Session OT - End of Session Activity Tolerance: Patient tolerated treatment well Patient left: in chair;with call bell/phone within reach    Robet Leu, COTA/L 03/24/2013, 3:11 PM

## 2013-03-24 NOTE — Progress Notes (Addendum)
CSW received call from [facility] that they can offer bed if pt can be LOG. CSW notified supervisor and presented bed offer to pt. Pt says he prefers [facility], as this is closer than [facility]. CSW called [facility] and notified them. CSW called admissions rep at [facility] and left her a voicemail stating pt chooses this facility and will come there tomorrow or on Wednesday at the latest, as this is discharge disposition at this time.   Maryclare Labrador, MSW, [facility] Clinical Social Worker [phone]

## 2013-03-24 NOTE — Progress Notes (Signed)
Patient Name: [name] Date of Encounter: 03/24/2013     Active Problems:   Hypoxia    SUBJECTIVE  Feeling stronger but still tires easily. PT feels he will benefit from short term SNF.  CURRENT MEDS . aspirin EC  81 mg Oral Daily  . carvedilol  3.125 mg Oral BID WC  . clopidogrel  75 mg Oral Daily  . famotidine  20 mg Oral BID  . heparin subcutaneous  5,000 Units Subcutaneous Q8H  . metroNIDAZOLE  500 mg Oral Q8H  . potassium chloride SA  20 mEq Oral TID  . predniSONE  40 mg Oral Q breakfast  . simvastatin  40 mg Oral QHS  . sodium chloride  3 mL Intravenous Q12H    OBJECTIVE  Filed Vitals:   03/24/13 0059 03/24/13 0100 03/24/13 0415 03/24/13 0806  BP: 99/74 99/74 101/73 108/69  Pulse: 85 84 80 85  Temp: 98 F (36.7 C)  98 F (36.7 C) 97.7 F (36.5 C)  TempSrc: Oral  Oral Oral  Resp: 21 30 20 25   Height:      Weight:   195 lb 12.3 oz (88.8 kg)   SpO2: 100% 100% 100% 100%    Intake/Output Summary (Last 24 hours) at 03/24/13 0852 Last data filed at 03/24/13 0015  Gross per 24 hour  Intake    603 ml  Output    350 ml  Net    253 ml   Filed Weights   03/22/13 0500 03/23/13 0515 03/24/13 0415  Weight: 192 lb 14.4 oz (87.5 kg) 195 lb 12.3 oz (88.8 kg) 195 lb 12.3 oz (88.8 kg)    PHYSICAL EXAM  General: Pleasant, NAD. Neuro: Alert and oriented X 3.  Psych: Normal affect. HEENT:  Normal  Neck: Supple without bruits or JVD. Lungs:  Resp regular and unlabored, CTA. Heart: RRR no s3, s4, or murmurs. Abdomen: Soft, non-tender, non-distended, BS + x 4.  Extremities: No clubbing, cyanosis or edema. DP/PT/Radials 2+ and equal bilaterally.  Accessory Clinical Findings  CBC  Recent Labs  03/22/13 0522  WBC 7.3  HGB 11.3*  HCT 34.8*  MCV 81.7  PLT 229   Basic Metabolic Panel  Recent Labs  03/22/13 0522  NA 133*  K 4.0  CL 98  CO2 21  GLUCOSE 100*  BUN 9  CREATININE 0.71  CALCIUM 8.9   Liver Function Tests  Recent Labs  03/22/13 0522  AST 14  ALT 14  ALKPHOS 138*  BILITOT 0.3  PROT 7.2  ALBUMIN 2.8*   No results found for this basename: LIPASE, AMYLASE,  in the last 72 hours Cardiac Enzymes No results found for this basename: CKTOTAL, CKMB, CKMBINDEX, TROPONINI,  in the last 72 hours BNP No components found with this basename: POCBNP,  D-Dimer No results found for this basename: DDIMER,  in the last 72 hours Hemoglobin A1C No results found for this basename: HGBA1C,  in the last 72 hours Fasting Lipid Panel No results found for this basename: CHOL, HDL, LDLCALC, TRIG, CHOLHDL, LDLDIRECT,  in the last 72 hours Thyroid Function Tests No results found for this basename: TSH, T4TOTAL, FREET3, T3FREE, THYROIDAB,  in the last 72 hours  TELE  NSR  ECG    Radiology/Studies  Dg Chest 2 View  03/03/2013   CLINICAL DATA:  52 year old male with chest soreness. Left upper extremity weakness. Initial encounter. Recent CABG and stroke.  EXAM: CHEST  2 VIEW  COMPARISON:  02/04/2013 and earlier.  FINDINGS:  Sequelae of median sternotomy. Right IJ central line removed. Lung volumes a. Baseline. No pneumothorax, pulmonary edema, pleural effusion or consolidation. Mild linear scarring or atelectasis at the left lung base. Visualized tracheal air column is within normal limits. Stable visualized osseous structures.  IMPRESSION: Minor left lung base atelectasis or scarring. No acute cardiopulmonary abnormality.   Electronically Signed   By: Augusto Gamble M.D.   On: 03/03/2013 10:18   Ct Angio Chest Pe W/cm &/or Wo Cm  03/18/2013   CLINICAL DATA:  Shortness of breath and chest pain.  EXAM: CT ANGIOGRAPHY CHEST WITH CONTRAST  TECHNIQUE: Multidetector CT imaging of the chest was performed using the standard protocol during bolus administration of intravenous contrast. Multiplanar CT image reconstructions including MIPs were obtained to evaluate the vascular anatomy.  CONTRAST:  63mL OMNIPAQUE IOHEXOL 350 MG/ML SOLN   COMPARISON:  Chest x-ray today.  FINDINGS: Evidence of subtle dependent bibasilar density likely atelectasis. Remainder of the lungs are clear. Median sternotomy wires are present. Findings suggesting a stent over the left anterior descending coronary artery. Pulmonary arterial system is well opacified in demonstrates no evidence of emboli. There is mild cardiomegaly. There is a significant pericardial effusion with somewhat loculated component of this pericardial effusion over the right anterior aspect of the pericardium with mild mass effect on the right atrium. Some of these changes may be related to patient's history of recent ventricular aneurysm resection. There is a 1.1 cm AP window lymph node. There are a few other small sub cm mediastinal and right pericardial phrenic lymph nodes. There is no hilar or axillary adenopathy.  Images through the upper abdomen are unremarkable. There is mild spondylosis of the spine.  Review of the MIP images confirms the above findings.  IMPRESSION: No evidence of pulmonary embolism.  Cardiomegaly with moderate pericardial effusion with somewhat loculated component of this pericardial effusion adjacent the right atrium with mild mass effect on the right atrium. This may be related patient's recent ventricular aneurysm resection.  Mild dependent bibasilar atelectatic change.  1.1 cm AP window lymph node likely reactive in nature.   Electronically Signed   By: Elberta Fortis M.D.   On: 03/18/2013 21:14   Dg Chest Port 1 View  03/18/2013   CLINICAL DATA:  Short of breath  EXAM: PORTABLE CHEST - 1 VIEW  COMPARISON:  03/03/2013  FINDINGS: Cardiac enlargement.  Pulmonary vascularity is normal.  Mild bibasilar atelectasis/infiltrate, increased from the prior study. Negative for effusion. Prior median sternotomy.  IMPRESSION: Mild bibasilar atelectasis/ infiltrate.  Negative for heart failure.   Electronically Signed   By: Marlan Palau M.D.   On: 03/18/2013 11:21   Dg Swallowing  Func-speech Pathology  03/19/2013   Lenor Derrick, CCC-SLP     03/19/2013  4:52 PM Objective Swallowing Evaluation: Modified Barium Swallowing Study   Patient Details  Name: [name] MRN: [id number] Date of Birth: [birth year]  Today's Date: 03/19/2013 Time: 1340-1415 SLP Time Calculation (min): 35 min  Past Medical History:  Past Medical History  Diagnosis Date  . Coronary artery disease     a. s/p MI in 2009 in [place] with stenting of the LCX and LAD;  b.  12/2012 NSTEMI/CAD: LM nl, LAD patent prox stent, LCX 50-70 isr  (FFR 0.84), RCA dom, 154m, EF 40-45%, sev basal to mid inf HK to  AK-->Med Rx.  . Tobacco abuse     a. quit 12/2012.  . Ischemic cardiomyopathy     a. 12/2012 Ech: EF  40-45%, Gr 1 DD.  Marland Kitchen Hyperlipidemia   . Pericardial effusion   . CVA (cerebral infarction)   . 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 Stroke   . Hypoxia 03/18/2013  . Myocardial infarction   . Shortness of breath    Past Surgical History:  Past Surgical History  Procedure Laterality Date  . Coronary angioplasty with stent placement  2000's X 2    "1 + 1" (01/06/2013)  . Cardiac catheterization  01/06/2013  . Coronary artery bypass graft N/A 01/28/2013    Procedure: CORONARY ARTERY BYPASS GRAFTING (CABG);  Surgeon:  Loreli Slot, MD;  Location: [facility] OR;  Service: Open Heart  Surgery;  Laterality: N/A;  CABG x one, using left greater  saphenous vein harvested endoscopically  . Intraoperative transesophageal echocardiogram N/A 01/28/2013    Procedure: INTRAOPERATIVE TRANSESOPHAGEAL ECHOCARDIOGRAM;   Surgeon: Loreli Slot, MD;  Location: [facility] OR;  Service:  Open Heart Surgery;  Laterality: N/A;  . Ventricular aneurysm resection N/A 01/28/2013    Procedure: LEFT VENTRICULAR ANEURYSM REPAIR;  Surgeon: Loreli Slot, MD;  Location: [facility] OR;  Service: Open Heart Surgery;   Laterality: N/A;   HPI:  Patient presents from the cardiology clinic.  Per report the  patient had hypoxia there, with oxygen saturation  in the mid-80s  this improved with supplemental oxygen.  Pt. exhibits gagging  behaviors and spits bolus into emesis basin stating "I can't  swallow."     Assessment / Plan / Recommendation Clinical Impression  Dysphagia Diagnosis: Within Functional Limits (Gagging behaviors  inconsistent) Clinical impression: Pt. was able to swallow thin and nectar  thick liquids, swallowed a 13mm barium tablet with thin liquid,  and chewed and swallowed a graham cracker without difficulty.   However, he exhibited significant gagging behaviors (with pudding  and purees) before swallowing, while the bolus was in the  anterior portion of the oral cavity, then spat out the bolus into  an emesis basin.  The pt. began coughing and bringing up thick,  yellow mucous (noted there was no barium in the mucous).  The  esophagus was completely clear after all boluses, and there was  no aspiration, penetration, laryngeal or esophageal residue.  ?  if the is psychosomatic vs. a lower GI issue. Pt. denies being  under stress or having any emotional issues.    Treatment Recommendation  No treatment recommended at this time    Diet Recommendation Dysphagia 3 (Mechanical Soft);Thin liquid   Liquid Administration via: Cup;Straw Medication Administration: Whole meds with liquid Supervision: Patient able to self feed;Intermittent supervision  to cue for compensatory strategies    Other  Recommendations Recommended Consults: Consider GI  evaluation   Follow Up Recommendations  None    Frequency and Duration        Pertinent Vitals/Pain Less gagging with use of chin tuck.    SLP Swallow Goals  n/a   General HPI: Patient presents from the cardiology clinic.  Per  report the patient had hypoxia there, with oxygen saturation in  the mid-80s this improved with supplemental oxygen.  Pt. exhibits  gagging behaviors and spits bolus into emesis basin stating "I  can't swallow." Type of Study: Modified Barium Swallowing Study Reason for Referral: Objectively  evaluate swallowing function Previous Swallow Assessment: BSE completed this am (see report).   01/30/13 Bedside swallow evaluation- Normal, Dys 3 (due to  dentures) with thin liquids recommended.  Not followed for  swallowing on Rehab.; Esophagram on 02/12/13 was normal, with no  aspiration or penetration and no evidence of hiatal hernia.  13  mm tablet passed without delay. Diet Prior to this Study: Regular;Thin liquids Temperature Spikes Noted: No Respiratory Status: Nasal cannula History of Recent Intubation: No Behavior/Cognition: Alert;Cooperative;Pleasant mood Oral Cavity - Dentition: Dentures, top;Missing dentition Oral Motor / Sensory Function: Impaired - see Bedside swallow  eval Self-Feeding Abilities: Able to feed self Patient Positioning: Upright in chair Baseline Vocal Quality: Clear Volitional Cough: Strong Volitional Swallow: Able to elicit Anatomy: Within functional limits Pharyngeal Secretions: Not observed secondary MBS    Reason for Referral Objectively evaluate swallowing function   Oral Phase Oral Preparation/Oral Phase Oral Phase: Impaired Oral - Pudding Oral - Pudding Teaspoon: Piecemeal swallowing Oral - Solids Oral - Puree: Piecemeal swallowing   Pharyngeal Phase Pharyngeal Phase Pharyngeal Phase: Within functional limits  Cervical Esophageal Phase    GO    Cervical Esophageal Phase Cervical Esophageal Phase: Vicente Masson T 03/19/2013, 4:51 PM     ASSESSMENT AND PLAN 1. S/P prior MI, complicated by stroke, pericarditis, subsequent purulent effusion with hemodynamic collapse - s/p CABG x 1 and repair of the pseudoaneurysm . Cardiac enzymes persistenly high. Sed rate and CRP are very high. 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 Have started empiric prednisone.  2. Swallowing disorder - speech therapy and GI have seen. GI did not feel that he needed endoscopy. Continue with speech therapy recommendation.  3. CAD - remote  stents - no active chest pain. Troponins elevated.Will continue to follow.  4. CVA - still with left arm weakness .  5. Leukocytosis ? Etiology. Consider Dresslers.  6. Pericardial effusion, moderate. Will continue ASA and Plavix  7. Diarrhea. Positive for C. difficile. On metronidazole. Improving.  8. LV systolic dysfunction with EF 25-30%. Blood pressure is too low to tolerate ACE at this time. We will also cut back on dose of carvedilol. Remains off lasix.  9. Deconditioning. Resume walking in hall  Plan: Check sed rate and labs today. Possible SNF in 1-2 days.  Signed, Cassell Clement  MD

## 2013-03-24 NOTE — Progress Notes (Addendum)
CSW checked for bed offers, and one facility, [facility], is considering accepting pt. Facility wondered if pt has submitted Medicaid application, and CSW confirmed that he did this yesterday, per pt. Facility also wondering if pt could come as a LOG (letter of guarantee)--CSW texted supervisor asking him to call CSW when he is out of morning meeting. CSW will talk with supervisor about LOG when he calls.  Addendum: CSW spoke with supervisor, who will complete an LOG for pt. Per RNCM, pt ready in 1-2 days. CSW called [facility] and informed them--admissions will speak with Wandra Mannan, CSW supervisor, to finalize details of LOG. If pt transfers to new unit before discharge, this CSW will provide new CSW a handoff.  Maryclare Labrador, MSW, [facility] Clinical Social Worker [phone]

## 2013-03-24 NOTE — Progress Notes (Signed)
CARDIAC REHAB PHASE I   PRE:  Rate/Rhythm: 100SR  BP:  Supine: 101/65  Sitting: 97/68  Standing: 91/75   SaO2: 99%RA  MODE:  Ambulation: 360 ft   POST:  Rate/Rhythm: 108ST  BP:  Supine:   Sitting: 96/65  Standing:    SaO2: 98%RA 1345-1420 Pt walked 360 ft on RA with cane and asst x 2 with steady gait. Denied dizziness. Stated he felt good today. Can be asst x 1 next walk as long as BP stable. To recliner after walk with call bell. In good spirits.   Luetta Nutting, RN BSN  03/24/2013 2:15 PM

## 2013-03-24 NOTE — Progress Notes (Signed)
Physical Therapy Treatment Patient Details Name: [name] MRN: [id number] DOB: [birth year] Today's Date: 03/24/2013 Time: 4098-1191 PT Time Calculation (min): 29 min  PT Assessment / Plan / Recommendation  History of Present Illness Pt adm with chest pain.  Pt recently dc'd after MI, CABG (2 months ago), rupture of pseudoaneurysm, and rt CVA. Pt found to be +C-diff   PT Comments   Pt admitted with above. Pt currently with functional limitations due to balance and endurance deficits.  Pt will benefit from skilled PT to increase their independence and safety with mobility to allow discharge to the venue listed below.   Follow Up Recommendations  SNF                 Equipment Recommendations  None recommended by PT        Frequency Min 3X/week   Progress towards PT Goals Progress towards PT goals: Progressing toward goals  Plan Current plan remains appropriate    Precautions / Restrictions Precautions Precautions: Fall Restrictions Weight Bearing Restrictions: No   Pertinent Vitals/Pain  No pain Orthostatic BPs  Supine 109/81  Sitting 99/70  Standing 107/78  Standing after 3 min 112/78  BP after mobility 97/73   Mobility  Bed Mobility Bed Mobility: Rolling Right;Right Sidelying to Sit;Sitting - Scoot to Edge of Bed Rolling Right: 6: Modified independent (Device/Increase time) Right Sidelying to Sit: 6: Modified independent (Device/Increase time) Supine to Sit: 6: Modified independent (Device/Increase time);HOB elevated Sitting - Scoot to Edge of Bed: 5: Supervision Transfers Transfers: Sit to Stand;Stand to Sit Sit to Stand: 4: Min guard;With upper extremity assist;With armrests;From chair/3-in-1 Stand to Sit: 4: Min guard;With upper extremity assist;To bed Details for Transfer Assistance: Verbal cues for hand placement.  Assist for safety/balance Ambulation/Gait Ambulation/Gait Assistance: 4: Min guard;4: Min assist Ambulation Distance (Feet): 150  Feet Assistive device: Large base quad cane Ambulation/Gait Assistance Details: verbal cues for postural stability.  Cues to move slowly, safe pace.  Gait somewhat unsteady at times which pt does self correct with steadying assist. Pt needed to use 3N1 on return therefore left pt on 3N1 beside bed.   Gait Pattern: Step-through pattern;Decreased stride length Gait velocity: Slow gait speed Stairs: Yes Stairs Assistance: 4: Min guard;4: Min assist Stairs Assistance Details (indicate cue type and reason): no cues needed, pt safe on steps Stair Management Technique: One rail Right;Step to pattern;Forwards Number of Stairs: 10 Wheelchair Mobility Wheelchair Mobility: No     PT Goals (current goals can now be found in the care plan section)    Visit Information  Last PT Received On: 03/24/13 Assistance Needed: +1 History of Present Illness: Pt adm with chest pain.  Pt recently dc'd after MI, CABG (2 months ago), rupture of pseudoaneurysm, and rt CVA. Pt found to be +C-diff    Subjective Data  Subjective: "I feel ok today."   Cognition  Cognition Arousal/Alertness: Awake/alert Behavior During Therapy: WFL for tasks assessed/performed Overall Cognitive Status: Within Functional Limits for tasks assessed    Balance  Balance Balance Assessed: Yes Static Standing Balance Static Standing - Balance Support: Right upper extremity supported Static Standing - Level of Assistance: 5: Stand by assistance High Level Balance High Level Balance Activites: Turns;Sudden stops;Head turns High Level Balance Comments: Decreased balance with high level balance activities  End of Session PT - End of Session Equipment Utilized During Treatment: Gait belt Activity Tolerance: Patient limited by fatigue Patient left: in chair;with call bell/phone within reach Nurse Communication: Mobility status  INGOLD,Oluwatosin Higginson 03/24/2013, 10:02 AM [facility] [phone]  (pager)

## 2013-03-25 ENCOUNTER — Other Ambulatory Visit: Payer: Self-pay | Admitting: Physician Assistant

## 2013-03-25 ENCOUNTER — Telehealth: Payer: Self-pay | Admitting: Nurse Practitioner

## 2013-03-25 ENCOUNTER — Encounter (HOSPITAL_COMMUNITY): Payer: Self-pay | Admitting: Physician Assistant

## 2013-03-25 DIAGNOSIS — D62 Acute posthemorrhagic anemia: Secondary | ICD-10-CM | POA: Diagnosis present

## 2013-03-25 DIAGNOSIS — I429 Cardiomyopathy, unspecified: Secondary | ICD-10-CM

## 2013-03-25 DIAGNOSIS — I314 Cardiac tamponade: Secondary | ICD-10-CM | POA: Insufficient documentation

## 2013-03-25 DIAGNOSIS — I639 Cerebral infarction, unspecified: Secondary | ICD-10-CM | POA: Diagnosis present

## 2013-03-25 DIAGNOSIS — I951 Orthostatic hypotension: Secondary | ICD-10-CM | POA: Diagnosis present

## 2013-03-25 DIAGNOSIS — J9601 Acute respiratory failure with hypoxia: Secondary | ICD-10-CM | POA: Diagnosis present

## 2013-03-25 DIAGNOSIS — A0472 Enterocolitis due to Clostridium difficile, not specified as recurrent: Secondary | ICD-10-CM | POA: Diagnosis present

## 2013-03-25 DIAGNOSIS — I241 Dressler's syndrome: Secondary | ICD-10-CM | POA: Diagnosis present

## 2013-03-25 DIAGNOSIS — Z720 Tobacco use: Secondary | ICD-10-CM | POA: Insufficient documentation

## 2013-03-25 DIAGNOSIS — J9600 Acute respiratory failure, unspecified whether with hypoxia or hypercapnia: Secondary | ICD-10-CM | POA: Diagnosis present

## 2013-03-25 DIAGNOSIS — E871 Hypo-osmolality and hyponatremia: Secondary | ICD-10-CM | POA: Diagnosis present

## 2013-03-25 DIAGNOSIS — I253 Aneurysm of heart: Secondary | ICD-10-CM | POA: Insufficient documentation

## 2013-03-25 DIAGNOSIS — I313 Pericardial effusion (noninflammatory): Secondary | ICD-10-CM

## 2013-03-25 DIAGNOSIS — R5381 Other malaise: Secondary | ICD-10-CM

## 2013-03-25 DIAGNOSIS — I2510 Atherosclerotic heart disease of native coronary artery without angina pectoris: Secondary | ICD-10-CM | POA: Diagnosis present

## 2013-03-25 DIAGNOSIS — R1310 Dysphagia, unspecified: Secondary | ICD-10-CM | POA: Diagnosis present

## 2013-03-25 DIAGNOSIS — I309 Acute pericarditis, unspecified: Secondary | ICD-10-CM | POA: Diagnosis present

## 2013-03-25 MED ORDER — NITROGLYCERIN 0.4 MG SL SUBL: 0.4000 mg | SUBLINGUAL_TABLET | SUBLINGUAL | Status: DC | PRN

## 2013-03-25 MED ORDER — CARVEDILOL 3.125 MG PO TABS: 3.1250 mg | ORAL_TABLET | Freq: Two times a day (BID) | ORAL | Status: DC

## 2013-03-25 MED ORDER — PREDNISONE 5 MG PO TABS: ORAL_TABLET | ORAL | Status: DC

## 2013-03-25 MED ORDER — METRONIDAZOLE 500 MG PO TABS: 500.0000 mg | ORAL_TABLET | Freq: Three times a day (TID) | ORAL | Status: DC

## 2013-03-25 MED ORDER — ASPIRIN 81 MG PO TBEC: 81.0000 mg | DELAYED_RELEASE_TABLET | Freq: Every day | ORAL | Status: DC

## 2013-03-25 MED ORDER — PREDNISONE 10 MG PO TABS
10.0000 mg | ORAL_TABLET | Freq: Every day | ORAL | Status: DC
  Filled 2013-03-25: qty 1

## 2013-03-25 NOTE — Progress Notes (Signed)
Assessment unchanged. Discussed D/C instructions with patient including new medications, how to use nitroglycerin, how to weigh one's self and signs and symptoms of fluid overload, diet recommendations, follow up appointments, and when to call [place]. Pt verbalized understanding. IV and tele removed. Packet given to patient to give to facility. Awaiting ride.

## 2013-03-25 NOTE — Discharge Summary (Signed)
Discharge Summary   Patient ID: [name] MRN: [id number], DOB/AGE: July 13, 1960 52 y.o. Admit date: 03/18/2013 D/C date:     03/25/2013  Primary Care Provider: Jeanann Lewandowsky, MD Primary Cardiologist: Patty Sermons  Primary Discharge Diagnoses:  1. Dressler's syndrome with myopericarditis - elevated troponin felt due to this - treated with steroid taper 2. Acute respiratory failure, resolved 3. Persistent moderate pericardial effusion 4. CAD with recent complicated hospitalization - past history: s/p [place] in 2009 in [place] with stenting of the LCX and LAD;  - 12/2012: NSTEMI --> med rx (cath: LM nl, LAD patent prox stent, LCX 50-70 isr (FFR 0.84), RCA dom, 131m, EF 40-45%).  - 01/2013: anterolateral STEMI complicated by pericardial effusion (presumed purulent pericarditis) and tamponade s/p drain, ruptured LV pseudoaneurysm s/p CorMatrix patch, CABGx1 (SVG-OM1), fever, CVA, VDRF, C difficile colitis, hyponatremia 5. Mixed ischemic/nonischemic cardiomyopathy - EF decreased to 25-30% by echo this admission - EF 40% 01/2013 - blood pressure too low to tolerate ACEI 6. Dysphagia felt possibly due to globus hystericus - barium swallow normal this admission, recent normal esophagram 01/2013 - GI did not feel any further workup necessary 7. Recurrent C difficile colitis - positive PCR this admission, treating with metronidazole - also occurred in 01/2013 8. Orthostatic hypotension 9. Leukocytosis 10. Hyponatremia 11. 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 Deconditioning 13. ABL anemia from 01/2013 admission, stable  Secondary Discharge Diagnoses:  1. Ruptured inferoposterior LV pseudoaneurysm s/p CorMatrix patch 01/2013 2. Purulent pericarditis with pericardial effusion s/p pericardial drain 01/2013 3. Ventilator dependent respiratory failure 01/2013 4. Hyperlipidemia 5. Former tobacco abuse, quit 12/2012  Hospital Course: [name] is a 52 y/o M with recently complex  history of CAD, ICM, pericardial effusion with tamponade, ruptured LV pseudoaneurysm, pericarditis, and CVA. He has history of MI 2009 with stenting of LCx and LAD. In 12/2012, he presented with NSTEMI and cath showed a totally occluded RCA, 50-70% ISR of LCx (FFR 0.84) EF 45-50% and was managed medically. He was readmitted in 01/2013 with recurrent chest pain and anterolateral ST elevation. During that admission, echo showed large effusion with tamponade and pericardial drain was placed. He was almost ready for discharge when he had marked deterioration with left arm weakness, left visual field defect and then hemodynamic collapse with hypotension and tachycardia. He spiked a fever of 104.5 and developed acute respiratory failure requiring intubation and high levels of PEEP. Repeat echo showed complex effusion with RV collapse and tamponade. He responded to volume resuscitation and antibiotics - presumed purulent pericarditis - went on to have emergent median sternotomy, repair of ruptured inferoposterior left ventricular pseudoaneurysm with CorMatrix patch and CABG x 1 with SVG to the OM1. He initially went to CIR for rehabilitation and during that time also had hyponatremia and C diff. He was discharged to home on October 31st and followed up with Dr. Dorris Fetch in the office.   He was referred back to cardiology for an office visit on 03/18/13 and was not doing well. He reported difficulty swallowing, chills, cough, and desaturations per home health. He was also tachycardic but afebrile. He was sent to the ER. He was hypoxic at 85% on RA with elevated RR thus was showing signs of respiratory failure for which he required supplemental nasal oxygen. EKG showed NSR with persistent ST elevation in inferior leads. This was not felt to represent and acute MI (was initially not having any CP). CXR showed mild bibasilar atx versus infiltrate but no CHF. CT angio  was performed that showed no evidence of PE, but did show  "cardiomegaly with moderate pericardial effusion with somewhat loculated component of this pericardial effusion adjacent the right atrium with mild mass effect on the right atrium." WBC was initially normal then began to rise.  CRP was >80, sed rate >81, and he was hyponatremic to a nadir of 130. Troponins returned positive to a peak of 3.27. He was placed on heparin per pharmacy transiently which was discontinued the following day. 2D echo 03/19/13 showed EF 25-30%, grade 1 diastolic dysfunction, and  repaired basal inferior wall, and loculated pericardial effusion in the proximity to the left atrium without any signs of tamponade. Early in this hospitalization he did go on to develop some pleuritic chest pain and it was suspected that his CP/elevated enzymes were due to Dressler's syndrome with myopericarditis. Given LV dysfunction, beta blocker was changed to Coreg.   He began to complain of severe diarrhea and was markedly orthostatic on 12/5 with BP dropping to 59/49. Medications were adjusted including discontinuation of ACEI and Lasix. C diff PCR returned positive and he was placed on metranidazole. GI was consulted because of recent complaints of dysphagia. Recent esophagram as well as MBS this admission were normal. Dr. Elnoria Howard felt that globus hystericus may be a possibility and recommended chin tucks when eating and eating solid food in very small portions. The patient remains on dysphagia 3 diet with thin liquids per SLP recommendations. Dr. Elnoria Howard suspected that symptoms would eventually resolve spontaneously and recommended no further GI intervention. Dr. Dorris Fetch had raised question of candida esophagitis, but GI did not feel this was the case. Once this was established, the patient was started on prednisone for his Dressler's syndrome. His hypoxia had begun to improve by that time and he no longer required oxygen. Activity was progressed with cardiac rehab, PT, & OT. Short-term SNF was recommended.  Sed rate was repeated and came down to normal at 10. His hyponatremia has improved to 133 and WBC is normal again (peak 14.6 on 12/4). Today the patient feels well. He has tolerated walking in the hall and denies any further CP or dyspnea. Dr. Patty Sermons has seen and examined the patient today and feels he is stable for discharge to SNF.   The patient remains off ACEI at this time due to borderline low BP but we can consider re-adding as outpatient. Of note, even though he is no longer on Lasix, he remains on KCl TID due to maintaining normal potassium on this dose while in the hospital. He will need to continue Metronidazole through the morning of 04/03/13 to complete a 14-day course. He will go to SNF with instructions for prednisone taper (10mg  daily for 1 week, then 5mg  daily for 1 week, then 2.5mg  daily for 1 week then stop).  Per our protocol, the patient will have a 1 week TOC appointment with a PA/NP in our office then followup with Dr Patty Sermons in 3-4 weeks. Per discussion with Dr. Patty Sermons, hold off on Lifevest given lack of arrhythmias while in hospital.   Followup studies scheduled. - repeat echo 05/21/2012 to reassess pericardial effusion, same day as OV - repeat echo 06/2012 to reassess LV function - if EF is low, will need referral to EP. NOTE: if EF has improved in January, this appointment will need to be cancelled.  Discharge Vitals: Blood pressure 100/75, pulse 86, temperature 98.6 F (37 C), temperature source Oral, resp. rate 18, height 6\' 1"  (1.854 m), weight 187  lb 4.8 oz (84.959 kg), SpO2 100.00%.  Labs: Lab Results  Component Value Date   WBC 9.5 03/24/2013   HGB 11.9* 03/24/2013   HCT 36.4* 03/24/2013   MCV 80.9 03/24/2013   PLT 267 03/24/2013     Recent Labs Lab 03/22/13 0522 03/24/13 1040  NA 133* 133*  K 4.0 3.8  CL 98 99  CO2 21 21  BUN 9 10  CREATININE 0.71 0.69  CALCIUM 8.9 9.1  PROT 7.2  --   BILITOT 0.3  --   ALKPHOS 138*  --   ALT 14  --   AST 14   --   GLUCOSE 100* 133*    Ref. Range 03/18/2013 19:00 03/18/2013 23:40 03/19/2013 03:23 03/19/2013 09:20 03/19/2013 19:25  Troponin I Latest Range: <0.30 ng/mL 1.94 (HH) 3.83 (HH) 2.89 (HH) 3.27 (HH) 2.23 (HH)  CRP, High Sensitivity No range found >80.0 (H)   >80.0 (H)     Lab Results  Component Value Date   CHOL 122 03/19/2013   HDL 29* 03/19/2013   LDLCALC 72 03/19/2013   TRIG 104 03/19/2013     Diagnostic Studies/Procedures    2D Echo 03/19/13 - Left ventricle: The cavity size was mildly dilated. Systolic function was severely reduced. The estimated ejection fraction was in the range of 25% to 30%. Doppler parameters are consistent with abnormal left ventricular relaxation (grade 1 diastolic dysfunction). - Atrial septum: No defect or patent foramen ovale was identified. - Impressions: Compared to the prior study there is now a repaired basal inferior wall post pseudoaneurysm rupture. Left ventricular ejection fraction has decreased to 25-30%. There is a loculated pericardial effusion in the proximity to the lefta trium without any signs of tamponade. Impressions: - Compared to the prior study there is now a repaired basal inferior wall post pseudoaneurysm rupture. Left ventricular ejection fraction has decreased to 25-30%. There is a loculated pericardial effusion in the proximity to the lefta trium without any signs of tamponade.  Ct Angio Chest Pe W/cm &/or Wo Cm 03/18/2013   CLINICAL DATA:  Shortness of breath and chest pain.  EXAM: CT ANGIOGRAPHY CHEST WITH CONTRAST  TECHNIQUE: Multidetector CT imaging of the chest was performed using the standard protocol during bolus administration of intravenous contrast. Multiplanar CT image reconstructions including MIPs were obtained to evaluate the vascular anatomy.  CONTRAST:  63mL OMNIPAQUE IOHEXOL 350 MG/ML SOLN  COMPARISON:  Chest x-ray today.  FINDINGS: Evidence of subtle dependent bibasilar density likely atelectasis. Remainder of the lungs are  clear. Median sternotomy wires are present. Findings suggesting a stent over the left anterior descending coronary artery. Pulmonary arterial system is well opacified in demonstrates no evidence of emboli. There is mild cardiomegaly. There is a significant pericardial effusion with somewhat loculated component of this pericardial effusion over the right anterior aspect of the pericardium with mild mass effect on the right atrium. Some of these changes may be related to patient's history of recent ventricular aneurysm resection. There is a 1.1 cm AP window lymph node. There are a few other small sub cm mediastinal and right pericardial phrenic lymph nodes. There is no hilar or axillary adenopathy.  Images through the upper abdomen are unremarkable. There is mild spondylosis of the spine.  Review of the MIP images confirms the above findings.  IMPRESSION: No evidence of pulmonary embolism.  Cardiomegaly with moderate pericardial effusion with somewhat loculated component of this pericardial effusion adjacent the right atrium with mild mass effect on the right atrium. This may  be related patient's recent ventricular aneurysm resection.  Mild dependent bibasilar atelectatic change.  1.1 cm AP window lymph node likely reactive in nature.   Electronically Signed   By: Elberta Fortis M.D.   On: 03/18/2013 21:14   Dg Chest Port 1 View 03/18/2013   CLINICAL DATA:  Short of breath  EXAM: PORTABLE CHEST - 1 VIEW  COMPARISON:  03/03/2013  FINDINGS: Cardiac enlargement.  Pulmonary vascularity is normal.  Mild bibasilar atelectasis/infiltrate, increased from the prior study. Negative for effusion. Prior median sternotomy.  IMPRESSION: Mild bibasilar atelectasis/ infiltrate.  Negative for heart failure.   Electronically Signed   By: Marlan Palau M.D.   On: 03/18/2013 11:21   Dg Swallowing Func-speech Pathology 03/19/2013   Lenor Derrick, CCC-SLP     03/19/2013  4:52 PM Objective Swallowing Evaluation: Modified Barium  Swallowing Study   Patient Details  Name: PARMINDER CUPPLES MRN: [id number] Date of Birth: Aug 04, 1960  Today's Date: 03/19/2013 Time: 1340-1415 SLP Time Calculation (min): 35 min  Past Medical History:  Past Medical History  Diagnosis Date  . Coronary artery disease     a. s/p MI in 2009 in [place] with stenting of the LCX and LAD;  b.  12/2012 NSTEMI/CAD: LM nl, LAD patent prox stent, LCX 50-70 isr  (FFR 0.84), RCA dom, 168m, EF 40-45%, sev basal to mid inf HK to  AK-->Med Rx.  . Tobacco abuse     a. quit 12/2012.  . Ischemic cardiomyopathy     a. 12/2012 Ech: EF 40-45%, Gr 1 DD.  Marland Kitchen Hyperlipidemia   . Pericardial effusion   . CVA (cerebral infarction)   . 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 Stroke   . Hypoxia 03/18/2013  . Myocardial infarction   . Shortness of breath    Past Surgical History:  Past Surgical History  Procedure Laterality Date  . Coronary angioplasty with stent placement  2000's X 2    "1 + 1" (01/06/2013)  . Cardiac catheterization  01/06/2013  . Coronary artery bypass graft N/A 01/28/2013    Procedure: CORONARY ARTERY BYPASS GRAFTING (CABG);  Surgeon:  Loreli Slot, MD;  Location: [facility] OR;  Service: Open Heart  Surgery;  Laterality: N/A;  CABG x one, using left greater  saphenous vein harvested endoscopically  . Intraoperative transesophageal echocardiogram N/A 01/28/2013    Procedure: INTRAOPERATIVE TRANSESOPHAGEAL ECHOCARDIOGRAM;   Surgeon: Loreli Slot, MD;  Location: [facility] OR;  Service:  Open Heart Surgery;  Laterality: N/A;  . Ventricular aneurysm resection N/A 01/28/2013    Procedure: LEFT VENTRICULAR ANEURYSM REPAIR;  Surgeon: Loreli Slot, MD;  Location: [facility] OR;  Service: Open Heart Surgery;   Laterality: N/A;   HPI:  Patient presents from the cardiology clinic.  Per report the  patient had hypoxia there, with oxygen saturation in the mid-80s  this improved with supplemental oxygen.  Pt. exhibits gagging  behaviors and spits bolus into emesis basin stating  "I can't  swallow."     Assessment / Plan / Recommendation Clinical Impression  Dysphagia Diagnosis: Within Functional Limits (Gagging behaviors  inconsistent) Clinical impression: Pt. was able to swallow thin and nectar  thick liquids, swallowed a 13mm barium tablet with thin liquid,  and chewed and swallowed a graham cracker without difficulty.   However, he exhibited significant gagging behaviors (with pudding  and purees) before swallowing, while the bolus was in the  anterior portion of  the oral cavity, then spat out the bolus into  an emesis basin.  The pt. began coughing and bringing up thick,  yellow mucous (noted there was no barium in the mucous).  The  esophagus was completely clear after all boluses, and there was  no aspiration, penetration, laryngeal or esophageal residue.  ?  if the is psychosomatic vs. a lower GI issue. Pt. denies being  under stress or having any emotional issues.    Treatment Recommendation  No treatment recommended at this time    Diet Recommendation Dysphagia 3 (Mechanical Soft);Thin liquid   Liquid Administration via: Cup;Straw Medication Administration: Whole meds with liquid Supervision: Patient able to self feed;Intermittent supervision  to cue for compensatory strategies    Other  Recommendations Recommended Consults: Consider GI  evaluation   Follow Up Recommendations  None    Frequency and Duration        Pertinent Vitals/Pain Less gagging with use of chin tuck.    SLP Swallow Goals  n/a   General HPI: Patient presents from the cardiology clinic.  Per  report the patient had hypoxia there, with oxygen saturation in  the mid-80s this improved with supplemental oxygen.  Pt. exhibits  gagging behaviors and spits bolus into emesis basin stating "I  can't swallow." Type of Study: Modified Barium Swallowing Study Reason for Referral: Objectively evaluate swallowing function Previous Swallow Assessment: BSE completed this am (see report).   01/30/13 Bedside swallow evaluation-  Normal, Dys 3 (due to  dentures) with thin liquids recommended.  Not followed for  swallowing on Rehab.; Esophagram on 02/12/13 was normal, with no  aspiration or penetration and no evidence of hiatal hernia.  13  mm tablet passed without delay. Diet Prior to this Study: Regular;Thin liquids Temperature Spikes Noted: No Respiratory Status: Nasal cannula History of Recent Intubation: No Behavior/Cognition: Alert;Cooperative;Pleasant mood Oral Cavity - Dentition: Dentures, top;Missing dentition Oral Motor / Sensory Function: Impaired - see Bedside swallow  eval Self-Feeding Abilities: Able to feed self Patient Positioning: Upright in chair Baseline Vocal Quality: Clear Volitional Cough: Strong Volitional Swallow: Able to elicit Anatomy: Within functional limits Pharyngeal Secretions: Not observed secondary MBS    Reason for Referral Objectively evaluate swallowing function   Oral Phase Oral Preparation/Oral Phase Oral Phase: Impaired Oral - Pudding Oral - Pudding Teaspoon: Piecemeal swallowing Oral - Solids Oral - Puree: Piecemeal swallowing   Pharyngeal Phase Pharyngeal Phase Pharyngeal Phase: Within functional limits  Cervical Esophageal Phase    GO    Cervical Esophageal Phase Cervical Esophageal Phase: Vicente Masson T 03/19/2013, 4:51 PM     Discharge Medications     Medication List    STOP taking these medications       furosemide 20 MG tablet  Commonly known as:  LASIX     lisinopril 5 MG tablet  Commonly known as:  PRINIVIL,ZESTRIL     metoprolol succinate 25 MG 24 hr tablet  Commonly known as:  TOPROL-XL      TAKE these medications       aspirin 81 MG EC tablet  Take 1 tablet (81 mg total) by mouth daily.     carvedilol 3.125 MG tablet  Commonly known as:  COREG  Take 1 tablet (3.125 mg total) by mouth 2 (two) times daily with a meal.     clopidogrel 75 MG tablet  Commonly known as:  PLAVIX  Take 1 tablet (75 mg total) by mouth daily.  famotidine 20 MG tablet    Commonly known as:  PEPCID  Take 1 tablet (20 mg total) by mouth 2 (two) times daily.     metroNIDAZOLE 500 MG tablet  Commonly known as:  FLAGYL  Take 1 tablet (500 mg total) by mouth every 8 (eight) hours. Started on the afternoon of 03/20/13. Needs 27 more doses starting 2pm on 03/25/13. (Will complete 14-day course the morning of 04/03/13.)     nitroGLYCERIN 0.4 MG SL tablet  Commonly known as:  NITROSTAT  Place 1 tablet (0.4 mg total) under the tongue every 5 (five) minutes as needed for chest pain (up to 3 doses). Do not take if systolic blood pressure is less than 100.     potassium chloride SA 20 MEQ tablet  Commonly known as:  K-DUR,KLOR-CON  Take 1 tablet (20 mEq total) by mouth 3 (three) times daily.     predniSONE 5 MG tablet  Commonly known as:  DELTASONE  Oral taper - take 10mg  daily for 1 week, then 5mg  daily for 1 week, then 2.5mg  daily for 1 week then stop.     simvastatin 40 MG tablet  Commonly known as:  ZOCOR  Take 1 tablet (40 mg total) by mouth at bedtime.        Disposition   The patient will be discharged in stable condition to SNF. Discharge Orders   Future Appointments Provider Department Dept Phone   03/30/2013 9:30 AM [facility] Covering Provider [facility] [phone]   03/30/2013 10:30 AM [facility]ww Financial Counselor [facility] [phone]   04/01/2013 10:30 AM Rosalio Macadamia, NP [facility] [place] Office [phone]   04/21/2013 3:00 PM Micki Riley, MD [facility] [phone]   04/28/2013 2:00 PM Mc-Site 3 Echo Echo 2 [facility] SITE 3 ECHO LAB [phone]   04/28/2013 3:45 PM Cassell Clement, MD [facility] Office [phone]   06/26/2013 10:30 AM Mc-Site 3 Echo Echo 2 [facility] SITE 3 ECHO LAB [phone]   Future Orders Complete By Expires   Diet - low sodium heart healthy  As directed    Scheduling  Instructions:     Dysphagia 3 mechanical soft with thin liquids. Chin tucks recommended.   Discharge instructions  As directed    Comments:     One of your heart tests showed weakness of the heart muscle this admission. This may make you more susceptible to weight gain from fluid retention, which can lead to symptoms that we call heart failure. For patients with congestive heart failure, we give them these special instructions:  1. Follow a low-salt diet and watch your fluid intake. In general, you should not be taking in more than 2 liters of fluid per day (no more than 8 glasses per day). Some patients are restricted to less than 1.5 liters of fluid per day (no more than 6 glasses per day). This includes sources of water in foods like soup, coffee, tea, milk, etc. 2. Weigh yourself on the same scale at same time of day and keep a log. 3. Call your doctor: (Anytime you feel any of the following symptoms)  - 3-4 pound weight gain in 1-2 days or 2 pounds overnight  - Shortness of breath, with or without a dry hacking cough  - Swelling in the hands, feet or stomach  - If you have to sleep on extra pillows at night in order to breathe  IT IS IMPORTANT TO LET YOUR DOCTOR KNOW EARLY ON IF YOU ARE HAVING SYMPTOMS SO WE CAN HELP YOU!   Increase activity slowly  As directed    Comments:     Do not drive until cleared by your heart doctor.     Follow-up Information   Follow up with [name], NP. (04/01/13 at 10:30am)    Specialty:  Nurse Practitioner   Contact information:   [address] [place] [address] [phone]       Follow up with Cassell Clement, MD. (04/28/13 - you will have a heart ultrasound at 2pm then appointment at 3:45pm with Dr. Patty Sermons)    Specialty:  Cardiology   Contact information:   [address] [place] [address] [phone]       Follow up with [facility]. (Repeat heart ultrasound 06/26/13 at 10:30am.  If your heart ultrasound in January shows a stronger heart muscle, you may not need this one. Please talk to Dr. Patty Sermons in January about this.)    Specialty:  Cardiology   Contact information:   [address] [place] [address] [phone]        Duration of Discharge Encounter: Greater than 30 minutes including physician and PA time.  Signed, Ronie Spies PA-C 03/25/2013, 10:47 AM

## 2013-03-25 NOTE — Care Management Note (Signed)
    Page 1 of 1   03/25/2013     4:43:56 PM   CARE MANAGEMENT NOTE 03/25/2013  Patient:  [name], [name]   Account Number:  [id number]  Date Initiated:  03/19/2013  Documentation initiated by:  MAYO,HENRIETTA  Subjective/Objective Assessment:   adm with dx of hypoxia; lives alone, active with [facility] for RN, PT, OT    PCP  Dr Jeanann Lewandowsky with [facility]     Action/Plan:   Anticipated DC Date:  03/25/2013   Anticipated DC Plan:  SKILLED NURSING FACILITY  In-house referral  Financial Counselor  Clinical Social Worker      DC Planning Services  CM consult      Choice offered to / List presented to:             Status of service:  Completed, signed off Medicare Important Message given?   (If response is "NO", the following Medicare IM given date fields will be blank) Date Medicare IM given:   Date Additional Medicare IM given:    Discharge Disposition:  SKILLED NURSING FACILITY  Per UR Regulation:  Reviewed for med. necessity/level of care/duration of stay  If discussed at Long Length of Stay Meetings, dates discussed:   03/24/2013    Comments:  03/25/13 Jersey Ravenscroft,RN,BSN [phone] PT DISCHARGED TO SNF TODAY, PER CSW ARRANGEMENTS, BY MEDICAL VAN TRANSPORT.

## 2013-03-25 NOTE — Clinical Social Work Note (Signed)
CSW confirmed Asst CSW Director had LOG referral to complete the Letter of Guarantee for pt to be faxed to [employer] at [facility].  Asst Director to fax.  CSW provided pt with packet to deliver to SNF.  Pt acknowledged understanding and agreeable to delivering admissions packet to SNF upon arrival.  RN aware that CSW has completed CSW dc portion and pt is ready to dc.  Vickii Penna, LCSWA [phone]  Clinical Social Work

## 2013-03-25 NOTE — Telephone Encounter (Signed)
New problem   7day TCM on 04/01/13 @10 :30 per danya pa.

## 2013-03-25 NOTE — Progress Notes (Signed)
For SNF placement today.  Education completed re: heart failure, daily weights, when to call the doctor, sodium restrictions, exercise progression.  Discussed phase II cardiac rehab, presently patient does not have insurance, is applying for "an orange card" and will discuss with financial counselor if it will pay for cardiac rehab.    [phone] Haskel Khan RN

## 2013-03-25 NOTE — Clinical Social Work Note (Addendum)
Plan change from below: CSW received a call from pt re: plan for dc.  Pt is planning to provide his own transportation to SNF.  SNF and RN are aware.   CSW staffed transportation issue with CSW Asst Director.  Pt will be transported to [facility], [place] in [place] via wheelchair/medical Zenaida Niece.  CSW will department will incur this expense.  CSW will arrange this transportation after lunch today.  RN and RNCM aware.

## 2013-03-25 NOTE — Progress Notes (Signed)
Pt transferred to 2W. CSW covering this unit has been provided a handoff.    [name], MSW, [facility] Clinical Social Worker [phone]

## 2013-03-25 NOTE — Progress Notes (Signed)
Patient Name: [name] Date of Encounter: 03/25/2013     Active Problems:   Hypoxia    SUBJECTIVE  Patient feels well this am. Tolerating walking in hall. No chest pain or dyspnea.  CURRENT MEDS . aspirin EC  81 mg Oral Daily  . carvedilol  3.125 mg Oral BID WC  . clopidogrel  75 mg Oral Daily  . famotidine  20 mg Oral BID  . heparin subcutaneous  5,000 Units Subcutaneous Q8H  . metroNIDAZOLE  500 mg Oral Q8H  . potassium chloride SA  20 mEq Oral TID  . [START ON 03/26/2013] predniSONE  10 mg Oral Q breakfast  . simvastatin  40 mg Oral QHS  . sodium chloride  3 mL Intravenous Q12H    OBJECTIVE  Filed Vitals:   03/24/13 1636 03/24/13 1643 03/24/13 2217 03/25/13 0603  BP: 98/75 98/75 88/63  100/75  Pulse: 90 89 89 86  Temp: 97.8 F (36.6 C)  99.1 F (37.3 C) 98.6 F (37 C)  TempSrc: Oral  Oral Oral  Resp: 24  19 18   Height:      Weight:    187 lb 4.8 oz (84.959 kg)  SpO2: 100%  98% 100%    Intake/Output Summary (Last 24 hours) at 03/25/13 0831 Last data filed at 03/24/13 1327  Gross per 24 hour  Intake    600 ml  Output    300 ml  Net    300 ml   Filed Weights   03/23/13 0515 03/24/13 0415 03/25/13 0603  Weight: 195 lb 12.3 oz (88.8 kg) 195 lb 12.3 oz (88.8 kg) 187 lb 4.8 oz (84.959 kg)    PHYSICAL EXAM  General: Pleasant, NAD. Neuro: Alert and oriented X 3. Left arm weak Psych: Normal affect. HEENT:  Normal  Neck: Supple without bruits or JVD. Lungs:  Resp regular and unlabored, CTA. Heart: RRR no s3, s4, or murmurs. Abdomen: Soft, non-tender, non-distended, BS + x 4.  Extremities: No clubbing, cyanosis or edema. DP/PT/Radials 2+ and equal bilaterally.  Accessory Clinical Findings  CBC  Recent Labs  03/24/13 1040  WBC 9.5  HGB 11.9*  HCT 36.4*  MCV 80.9  PLT 267   Basic Metabolic Panel  Recent Labs  03/24/13 1040  NA 133*  K 3.8  CL 99  CO2 21  GLUCOSE 133*  BUN 10  CREATININE 0.69  CALCIUM 9.1   Liver Function  Tests No results found for this basename: AST, ALT, ALKPHOS, BILITOT, PROT, ALBUMIN,  in the last 72 hours No results found for this basename: LIPASE, AMYLASE,  in the last 72 hours Cardiac Enzymes No results found for this basename: CKTOTAL, CKMB, CKMBINDEX, TROPONINI,  in the last 72 hours BNP No components found with this basename: POCBNP,  D-Dimer No results found for this basename: DDIMER,  in the last 72 hours Hemoglobin A1C No results found for this basename: HGBA1C,  in the last 72 hours Fasting Lipid Panel No results found for this basename: CHOL, HDL, LDLCALC, TRIG, CHOLHDL, LDLDIRECT,  in the last 72 hours Thyroid Function Tests No results found for this basename: TSH, T4TOTAL, FREET3, T3FREE, THYROIDAB,  in the last 72 hours  TELE  NSR  ECG  Improved.  Less ST elevation inferior leads.  Radiology/Studies  Dg Chest 2 View  03/03/2013   CLINICAL DATA:  52 year old male with chest soreness. Left upper extremity weakness. Initial encounter. Recent CABG and stroke.  EXAM: CHEST  2 VIEW  COMPARISON:  02/04/2013 and  earlier.  FINDINGS: Sequelae of median sternotomy. Right IJ central line removed. Lung volumes a. Baseline. No pneumothorax, pulmonary edema, pleural effusion or consolidation. Mild linear scarring or atelectasis at the left lung base. Visualized tracheal air column is within normal limits. Stable visualized osseous structures.  IMPRESSION: Minor left lung base atelectasis or scarring. No acute cardiopulmonary abnormality.   Electronically Signed   By: Augusto Gamble M.D.   On: 03/03/2013 10:18   Ct Angio Chest Pe W/cm &/or Wo Cm  03/18/2013   CLINICAL DATA:  Shortness of breath and chest pain.  EXAM: CT ANGIOGRAPHY CHEST WITH CONTRAST  TECHNIQUE: Multidetector CT imaging of the chest was performed using the standard protocol during bolus administration of intravenous contrast. Multiplanar CT image reconstructions including MIPs were obtained to evaluate the vascular  anatomy.  CONTRAST:  63mL OMNIPAQUE IOHEXOL 350 MG/ML SOLN  COMPARISON:  Chest x-ray today.  FINDINGS: Evidence of subtle dependent bibasilar density likely atelectasis. Remainder of the lungs are clear. Median sternotomy wires are present. Findings suggesting a stent over the left anterior descending coronary artery. Pulmonary arterial system is well opacified in demonstrates no evidence of emboli. There is mild cardiomegaly. There is a significant pericardial effusion with somewhat loculated component of this pericardial effusion over the right anterior aspect of the pericardium with mild mass effect on the right atrium. Some of these changes may be related to patient's history of recent ventricular aneurysm resection. There is a 1.1 cm AP window lymph node. There are a few other small sub cm mediastinal and right pericardial phrenic lymph nodes. There is no hilar or axillary adenopathy.  Images through the upper abdomen are unremarkable. There is mild spondylosis of the spine.  Review of the MIP images confirms the above findings.  IMPRESSION: No evidence of pulmonary embolism.  Cardiomegaly with moderate pericardial effusion with somewhat loculated component of this pericardial effusion adjacent the right atrium with mild mass effect on the right atrium. This may be related patient's recent ventricular aneurysm resection.  Mild dependent bibasilar atelectatic change.  1.1 cm AP window lymph node likely reactive in nature.   Electronically Signed   By: Elberta Fortis M.D.   On: 03/18/2013 21:14   Dg Chest Port 1 View  03/18/2013   CLINICAL DATA:  Short of breath  EXAM: PORTABLE CHEST - 1 VIEW  COMPARISON:  03/03/2013  FINDINGS: Cardiac enlargement.  Pulmonary vascularity is normal.  Mild bibasilar atelectasis/infiltrate, increased from the prior study. Negative for effusion. Prior median sternotomy.  IMPRESSION: Mild bibasilar atelectasis/ infiltrate.  Negative for heart failure.   Electronically Signed   By:  Marlan Palau M.D.   On: 03/18/2013 11:21   Dg Swallowing Func-speech Pathology  03/19/2013   Lenor Derrick, CCC-SLP     03/19/2013  4:52 PM Objective Swallowing Evaluation: Modified Barium Swallowing Study   Patient Details  Name: [name] MRN: [id number] Date of Birth: 1960-06-26  Today's Date: 03/19/2013 Time: 1340-1415 SLP Time Calculation (min): 35 min  Past Medical History:  Past Medical History  Diagnosis Date  . Coronary artery disease     a. s/p MI in 2009 in [place] with stenting of the LCX and LAD;  b.  12/2012 NSTEMI/CAD: LM nl, LAD patent prox stent, LCX 50-70 isr  (FFR 0.84), RCA dom, 152m, EF 40-45%, sev basal to mid inf HK to  AK-->Med Rx.  . Tobacco abuse     a. quit 12/2012.  . Ischemic cardiomyopathy     a.  12/2012 Ech: EF 40-45%, Gr 1 DD.  Marland Kitchen Hyperlipidemia   . Pericardial effusion   . CVA (cerebral infarction)   . 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 Stroke   . Hypoxia 03/18/2013  . Myocardial infarction   . Shortness of breath    Past Surgical History:  Past Surgical History  Procedure Laterality Date  . Coronary angioplasty with stent placement  2000's X 2    "1 + 1" (01/06/2013)  . Cardiac catheterization  01/06/2013  . Coronary artery bypass graft N/A 01/28/2013    Procedure: CORONARY ARTERY BYPASS GRAFTING (CABG);  Surgeon:  Loreli Slot, MD;  Location: [facility] OR;  Service: Open Heart  Surgery;  Laterality: N/A;  CABG x one, using left greater  saphenous vein harvested endoscopically  . Intraoperative transesophageal echocardiogram N/A 01/28/2013    Procedure: INTRAOPERATIVE TRANSESOPHAGEAL ECHOCARDIOGRAM;   Surgeon: Loreli Slot, MD;  Location: [facility] OR;  Service:  Open Heart Surgery;  Laterality: N/A;  . Ventricular aneurysm resection N/A 01/28/2013    Procedure: LEFT VENTRICULAR ANEURYSM REPAIR;  Surgeon: Loreli Slot, MD;  Location: [facility] OR;  Service: Open Heart Surgery;   Laterality: N/A;   HPI:  Patient presents from the cardiology clinic.  Per  report the  patient had hypoxia there, with oxygen saturation in the mid-80s  this improved with supplemental oxygen.  Pt. exhibits gagging  behaviors and spits bolus into emesis basin stating "I can't  swallow."     Assessment / Plan / Recommendation Clinical Impression  Dysphagia Diagnosis: Within Functional Limits (Gagging behaviors  inconsistent) Clinical impression: Pt. was able to swallow thin and nectar  thick liquids, swallowed a 13mm barium tablet with thin liquid,  and chewed and swallowed a graham cracker without difficulty.   However, he exhibited significant gagging behaviors (with pudding  and purees) before swallowing, while the bolus was in the  anterior portion of the oral cavity, then spat out the bolus into  an emesis basin.  The pt. began coughing and bringing up thick,  yellow mucous (noted there was no barium in the mucous).  The  esophagus was completely clear after all boluses, and there was  no aspiration, penetration, laryngeal or esophageal residue.  ?  if the is psychosomatic vs. a lower GI issue. Pt. denies being  under stress or having any emotional issues.    Treatment Recommendation  No treatment recommended at this time    Diet Recommendation Dysphagia 3 (Mechanical Soft);Thin liquid   Liquid Administration via: Cup;Straw Medication Administration: Whole meds with liquid Supervision: Patient able to self feed;Intermittent supervision  to cue for compensatory strategies    Other  Recommendations Recommended Consults: Consider GI  evaluation   Follow Up Recommendations  None    Frequency and Duration        Pertinent Vitals/Pain Less gagging with use of chin tuck.    SLP Swallow Goals  n/a   General HPI: Patient presents from the cardiology clinic.  Per  report the patient had hypoxia there, with oxygen saturation in  the mid-80s this improved with supplemental oxygen.  Pt. exhibits  gagging behaviors and spits bolus into emesis basin stating "I  can't swallow." Type of Study: Modified  Barium Swallowing Study Reason for Referral: Objectively evaluate swallowing function Previous Swallow Assessment: BSE completed this am (see report).   01/30/13 Bedside swallow evaluation- Normal, Dys 3 (due to  dentures) with thin  liquids recommended.  Not followed for  swallowing on Rehab.; Esophagram on 02/12/13 was normal, with no  aspiration or penetration and no evidence of hiatal hernia.  13  mm tablet passed without delay. Diet Prior to this Study: Regular;Thin liquids Temperature Spikes Noted: No Respiratory Status: Nasal cannula History of Recent Intubation: No Behavior/Cognition: Alert;Cooperative;Pleasant mood Oral Cavity - Dentition: Dentures, top;Missing dentition Oral Motor / Sensory Function: Impaired - see Bedside swallow  eval Self-Feeding Abilities: Able to feed self Patient Positioning: Upright in chair Baseline Vocal Quality: Clear Volitional Cough: Strong Volitional Swallow: Able to elicit Anatomy: Within functional limits Pharyngeal Secretions: Not observed secondary MBS    Reason for Referral Objectively evaluate swallowing function   Oral Phase Oral Preparation/Oral Phase Oral Phase: Impaired Oral - Pudding Oral - Pudding Teaspoon: Piecemeal swallowing Oral - Solids Oral - Puree: Piecemeal swallowing   Pharyngeal Phase Pharyngeal Phase Pharyngeal Phase: Within functional limits  Cervical Esophageal Phase    GO    Cervical Esophageal Phase Cervical Esophageal Phase: Vicente Masson T 03/19/2013, 4:51 PM     ASSESSMENT AND PLAN 1. 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 Repeat sed rate is 10. 2. Swallowing disorder - speech therapy and GI have seen. GI did not feel that he needed endoscopy. Continue with speech therapy recommendation.  3. CAD - remote stents - Continue ASA and plavix. 4. CVA - still with left  arm weakness .  5. Leukocytosis resolved. 6. Pericardial effusion, moderate. Will continue ASA and Plavix  7. Diarrhea. Positive for C. difficile. On metronidazole. Improving.  8. LV systolic dysfunction with EF 25-30%. Blood pressure is too low to tolerate ACE at this time.  9. Deconditioning. Resume walking in hall  Plan: DC to SNF today. Taper steroids further over next several weeks.  10 mg daily x 1 week then 5 mg daily x 1 week then 2.5 mg daily x 1 week then DC.  See me in office 3-4 weeks. Finish out 2 week course of metronidazole.  Signed, Cassell Clement  MD

## 2013-03-25 NOTE — Progress Notes (Signed)
Pt left via W/C accompanied by NT with belongings. 

## 2013-03-26 NOTE — Telephone Encounter (Signed)
LMTCB Debbie Kreston Ahrendt RN  

## 2013-03-26 NOTE — Telephone Encounter (Signed)
Patient contacted regarding discharge from [facility] on 03/25/13.  Patient understands to follow up with provider [name] on 04/01/13 at 10:30 at [facility] office. Patient understands discharge instructions? yes Patient understands medications and regiment? yes Patient understands to bring all medications to this visit? yes  Pt is currently at [facility] in [place] for as pt states continued physical therapy on left arm\ States that they have arranged transportation for him to/from appt next week  Mylo Red RN

## 2013-03-30 ENCOUNTER — Ambulatory Visit: Payer: Self-pay

## 2013-04-01 ENCOUNTER — Ambulatory Visit (INDEPENDENT_AMBULATORY_CARE_PROVIDER_SITE_OTHER): Payer: Medicaid Other | Admitting: Nurse Practitioner

## 2013-04-01 ENCOUNTER — Encounter: Payer: Self-pay | Admitting: Nurse Practitioner

## 2013-04-01 VITALS — BP 110/80 | HR 101 | Ht 73.0 in | Wt 197.4 lb

## 2013-04-01 DIAGNOSIS — I428 Other cardiomyopathies: Secondary | ICD-10-CM

## 2013-04-01 DIAGNOSIS — I429 Cardiomyopathy, unspecified: Secondary | ICD-10-CM

## 2013-04-01 NOTE — Patient Instructions (Signed)
Stay on your current medicines  See Dr. Patty Sermons back next month after the ultrasound of your heart  Call the [facility] office at [phone] if you have any questions, problems or concerns.

## 2013-04-01 NOTE — Progress Notes (Signed)
[name] Date of Birth: [birth year] Medical Record #[id number]  History of Present Illness: Mr. Clack is seen back today for a post hospital visit/TOC visit. Seen for Dr. Patty Sermons. He is a 52 year old male with known CAD - prior stents to the LAD and LCX in [place] back in 2007. Other issues included tobacco abuse and HLD.   In September of this year, he presented with chest pain - was cathed - showing a totally occluded RCA and a 50% ISR of the LCX stent. Managed medically. Readmitted with recurrent chest pain and anterolateral ST elevation in early October. Echo showed large effusion with tamponade. Pericardial drain was placed. He was almost ready for discharge when he had marked deterioration with left arm weakness, left visual field defect and then hemodynamic collapse with hypotension and tachycardia. Spiked fever of 104.5. Required intubation and high levels of PEEP. Repeat echo showed complex effusion with RV collapse and tamponade. He responded to volume resuscitation and antibiotics - presumed purulent pericarditis - went on to have emergent median sternotomy, repair of ruptured inferoposterior left ventricular pseudoaneurysm with CorMatrix patch and CABG x 1 with SVG to the OM1.   Discharged to CIR - discharged to home on October 31st. Seen by Dr. Dorris Fetch in November. Referred back here for continuing cardiology care. He was not doing well when I saw him and I actually ended up admitting him back to the hospital with hypoxia, chest pain and respiratory failure.  He had an elevated troponin which was persistent- felt to have dressler's syndrome with myopericarditis, persistent moderate pericardial effusion and recurrent C diff colitis - treated with Flagyl.  Echo showed an EF of 25%. Sed rates were up. He was treated with prednisone as well. He had some swallowing issues and was seen by GI - they felt this was globus hystericus and that no further work up needed and would  improve with chin tuck with eating and small portions. Still with a loculated component of his pericaradial effusion and was seen by Dr. Dorris Fetch. Got orthostatic. Meds were adjusted. Still without ACE due to soft BP but would attempt as an outpatient. To have repeat echo in January with a return OV and repeat echo in March to look at [facility] - may need referral to EP. If his EF looks better on the January echo then would cancel the study for March.  Comes back today. Here alone. Actually walked in using his cane. Feels much better Breathing has improved. No chest pain. No swelling. Getting therapy for his left arm weakness. Swallowing has improved. No more diarrhea. He is quite happy that he is making progress. Tolerating his medicines. BP still pretty soft. Had labs this morning at the facility.  Current Outpatient Prescriptions  Medication Sig Dispense Refill  . aspirin 81 MG EC tablet Take 1 tablet (81 mg total) by mouth daily.      . carvedilol (COREG) 3.125 MG tablet Take 1 tablet (3.125 mg total) by mouth 2 (two) times daily with a meal.      . clopidogrel (PLAVIX) 75 MG tablet Take 1 tablet (75 mg total) by mouth daily.  30 tablet  11  . famotidine (PEPCID) 20 MG tablet Take 1 tablet (20 mg total) by mouth 2 (two) times daily.  60 tablet  1  . metroNIDAZOLE (FLAGYL) 500 MG tablet Take 1 tablet (500 mg total) by mouth every 8 (eight) hours. Started on the afternoon of 03/20/13. Needs 27 more doses starting 2pm  on 03/25/13. (Will complete 14-day course the morning of 04/03/13.)      . nitroGLYCERIN (NITROSTAT) 0.4 MG SL tablet Place 1 tablet (0.4 mg total) under the tongue every 5 (five) minutes as needed for chest pain (up to 3 doses). Do not take if systolic blood pressure is less than 100.      Marland Kitchen potassium chloride SA (K-DUR,KLOR-CON) 20 MEQ tablet Take 1 tablet (20 mEq total) by mouth 3 (three) times daily.  90 tablet  1  . predniSONE (DELTASONE) 5 MG tablet Oral taper - take 10mg  daily for 1  week, then 5mg  daily for 1 week, then 2.5mg  daily for 1 week then stop.      . simvastatin (ZOCOR) 40 MG tablet Take 1 tablet (40 mg total) by mouth at bedtime.  30 tablet  11   No current facility-administered medications for this visit.    No Known Allergies  Past Medical History  Diagnosis Date  . Coronary artery disease     a. s/p MI in 2009 in [place] with stenting of the LCX and LAD;  b. 12/2012 NSTEMI/CAD: LM nl, LAD patent prox stent, LCX 50-70 isr (FFR 0.84), RCA dom, 127m, EF 40-45%-->Med Rx. c. 01/2013: anterolateral STEMI complicated by pericardial effusion (presumed purulent pericarditis) and tamponade s/p drain, ruptured LV pseudoaneurysm s/p CorMatrix patch, CABGx1 (SVG-OM1), fever, CVA, VDRF, C diff.  . Tobacco abuse     a. quit 12/2012.  . Cardiomyopathy     a. Sept/Oct 2014: EF ~40% (ICM). b. 03/2013: EF 25-30%. Off ACEI due to hypotension (MIXED NICM/ICM).  . Hyperlipidemia   . Pericardial effusion     a. 01/2013: pericardial effusion (presumed purulent pericarditis) and cardiac tamponade s/p drain. b. Persistent moderate pericardial effusion 03/2013.  . Cardiac tamponade     a. 01/2013 s/p drain.  . Pseudoaneurysm of left ventricle of heart     a. 01/2013:  Ruptured inferoposterior LV pseudoaneurysm s/p CorMatrix patch.  . CVA (cerebral infarction)     a. 01/2013 in setting of prolonged hospitalization - residual L arm weakness.  . C. difficile colitis     a. 01/2013 during prolonged adm. b. Recurred 03/2013.  Marland Kitchen Acute respiratory failure     a. 01/2013: VDRF. b. 03/2013: hypoxia requiring supp O2 during adm, resolved by discharge.  Marland Kitchen Hyponatremia     a. During late 2014.  . 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a. 03/2013: readm with hypoxia, tachycardia, elevated ESR/CRP, elevated troponin with 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and myopericarditis; treated with steroids.  . Acute myopericarditis     a. 03/2013: readm with hypoxia, tachycardia, elevated ESR/CRP, elevated troponin with Dressler  syndrome and myopericarditis; treated with steroids.  Marland Kitchen Dysphagia     a. 03/2013: felt possibly due to globus hystericus;barium swallow normal 03/2013, recent normal esophagram 01/2013. GI did not feel any further w/u needed at that time.  . Orthostatic hypotension     a. Profound during 03/2013 admission (BP dropping to 50's systolic) requiring meds scaled back.  . Physical deconditioning   . Anemia due to acute blood loss     a. 01/2013 hospitalization (s/p surgery).    Past Surgical History  Procedure Laterality Date  . Coronary angioplasty with stent placement  2000's X 2    "1 + 1" (01/06/2013)  . Cardiac catheterization  01/06/2013  . Coronary artery bypass graft N/A 01/28/2013    Procedure: CORONARY ARTERY BYPASS GRAFTING (CABG);  Surgeon: Loreli Slot, MD;  Location: [facility] OR;  Service:  Open Heart Surgery;  Laterality: N/A;  CABG x one, using left greater saphenous vein harvested endoscopically  . Intraoperative transesophageal echocardiogram N/A 01/28/2013    Procedure: INTRAOPERATIVE TRANSESOPHAGEAL ECHOCARDIOGRAM;  Surgeon: Loreli Slot, MD;  Location: [facility] OR;  Service: Open Heart Surgery;  Laterality: N/A;  . Ventricular aneurysm resection N/A 01/28/2013    Procedure: LEFT VENTRICULAR ANEURYSM REPAIR;  Surgeon: Loreli Slot, MD;  Location: [facility] OR;  Service: Open Heart Surgery;  Laterality: N/A;    History  Smoking status  . Former Smoker -- 1.00 packs/day for 35 years  . Types: Cigarettes  . Quit date: 01/06/2013  Smokeless tobacco  . Never Used    History  Alcohol Use No    Family History  Problem Relation Age of Onset  . CAD      Review of Systems: The review of systems is per the HPI.  All other systems were reviewed and are negative.  Physical Exam: BP 110/80  Pulse 101  Ht 6\' 1"  (1.854 m)  Wt 197 lb 6.4 oz (89.54 kg)  BMI 26.05 kg/m2  SpO2 96% Patient is very pleasant and in no acute distress. Looks much better today. Skin is warm  and dry. Color is normal.  HEENT is unremarkable. Normocephalic/atraumatic. PERRL. Sclera are nonicteric. Neck is supple. No masses. No JVD. Lungs are clear. Cardiac exam shows a regular rate and rhythm. He is tachycardic with an S3. Abdomen is soft. Extremities are without edema. Gait and ROM are intact. No gross neurologic deficits noted.  LABORATORY DATA: Lab Results  Component Value Date   WBC 9.5 03/24/2013   HGB 11.9* 03/24/2013   HCT 36.4* 03/24/2013   PLT 267 03/24/2013   GLUCOSE 133* 03/24/2013   CHOL 122 03/19/2013   TRIG 104 03/19/2013   HDL 29* 03/19/2013   LDLCALC 72 03/19/2013   ALT 14 03/22/2013   AST 14 03/22/2013   NA 133* 03/24/2013   K 3.8 03/24/2013   CL 99 03/24/2013   CREATININE 0.69 03/24/2013   BUN 10 03/24/2013   CO2 21 03/24/2013   TSH 0.604 01/05/2013   INR 1.23 03/18/2013   HGBA1C 5.7* 01/05/2013     Assessment / Plan: 1. Dressler's syndrome with myopericarditis - on steroid taper with improved symptoms  2. Mixed ischemic/nonischemic CM - EF of 25% - BP still too soft - was 90/60 by me - not able to titrate medicines at this time. For repeat echo in January with a follow up visit with Dr. Patty Sermons.  3. Dysphagia - improving with current therapy  4. CA with past MI and PCI in 2009, recurrent NSTEMI in 12/2012, anterolateral STEMI 01/2013 with CABG x 1 and repair of ruptured LV pseudoaneurysm.   5. C diff - no more diarrhea - finishing his Flagyl later this week.  6. Respiratory failure - looks resolved.   7. Persistent pericardial effusion - not sure what the long term plan for this will be - for repeat echo next month.   Overall, he looks to be doing better. No change in current regimen. BP too low to try and titrate his medicines unfortunately. See him back in a month with echo per plan.  Patient is agreeable to this plan and will call if any problems develop in the interim.   Rosalio Macadamia, RN, ANP-C [facility] [address] [place], [place]  [address]

## 2013-04-07 ENCOUNTER — Other Ambulatory Visit: Payer: Self-pay | Admitting: *Deleted

## 2013-04-07 DIAGNOSIS — R9430 Abnormal result of cardiovascular function study, unspecified: Secondary | ICD-10-CM

## 2013-04-14 ENCOUNTER — Telehealth: Payer: Self-pay | Admitting: Nurse Practitioner

## 2013-04-14 NOTE — Telephone Encounter (Signed)
New message     Pt forgot to ask [name] if he can return to driving

## 2013-04-15 NOTE — Telephone Encounter (Signed)
Follow up     Pt needs to know if he can start back driving

## 2013-04-15 NOTE — Telephone Encounter (Addendum)
msg left for pt/ DR/NP are out of office and you will not hear back till next week on his driving status.

## 2013-04-15 NOTE — Telephone Encounter (Signed)
Is he still at the facility?  Would wait til seen back for OV

## 2013-04-17 ENCOUNTER — Other Ambulatory Visit: Payer: Self-pay | Admitting: *Deleted

## 2013-04-17 MED ORDER — NITROGLYCERIN 0.4 MG SL SUBL: 0.4000 mg | SUBLINGUAL_TABLET | SUBLINGUAL | Status: DC | PRN

## 2013-04-17 MED ORDER — POTASSIUM CHLORIDE CRYS ER 20 MEQ PO TBCR: 20.0000 meq | EXTENDED_RELEASE_TABLET | Freq: Three times a day (TID) | ORAL | Status: DC

## 2013-04-17 MED ORDER — CARVEDILOL 3.125 MG PO TABS: 3.1250 mg | ORAL_TABLET | Freq: Two times a day (BID) | ORAL | Status: DC

## 2013-04-17 NOTE — Telephone Encounter (Signed)
Called wanting to know if he could drive.  Per message on 12/31 Kathrene Alu advised that he should wait until he sees Dr. Mare Ferrari on 04/28/13.  He is agreeable with that and will wait to see Dr. Mare Ferrari.

## 2013-04-17 NOTE — Telephone Encounter (Signed)
Follow up     Wants to know if he can drive

## 2013-04-18 NOTE — Telephone Encounter (Signed)
Agree with plan 

## 2013-04-21 ENCOUNTER — Ambulatory Visit: Payer: Self-pay | Admitting: Neurology

## 2013-04-28 ENCOUNTER — Encounter: Payer: Self-pay | Admitting: Cardiovascular Disease

## 2013-04-28 ENCOUNTER — Other Ambulatory Visit: Payer: Self-pay

## 2013-04-28 ENCOUNTER — Ambulatory Visit (INDEPENDENT_AMBULATORY_CARE_PROVIDER_SITE_OTHER): Payer: Medicaid Other | Admitting: Cardiology

## 2013-04-28 ENCOUNTER — Encounter: Payer: Self-pay | Admitting: Cardiology

## 2013-04-28 ENCOUNTER — Ambulatory Visit (HOSPITAL_COMMUNITY): Payer: Medicaid Other | Attending: Cardiovascular Disease | Admitting: Radiology

## 2013-04-28 VITALS — BP 112/75 | HR 90 | Ht 73.0 in | Wt 195.0 lb

## 2013-04-28 DIAGNOSIS — E785 Hyperlipidemia, unspecified: Secondary | ICD-10-CM | POA: Insufficient documentation

## 2013-04-28 DIAGNOSIS — I2589 Other forms of chronic ischemic heart disease: Secondary | ICD-10-CM

## 2013-04-28 DIAGNOSIS — I241 Dressler's syndrome: Secondary | ICD-10-CM | POA: Insufficient documentation

## 2013-04-28 DIAGNOSIS — I429 Cardiomyopathy, unspecified: Secondary | ICD-10-CM

## 2013-04-28 DIAGNOSIS — I3139 Other pericardial effusion (noninflammatory): Secondary | ICD-10-CM

## 2013-04-28 DIAGNOSIS — I313 Pericardial effusion (noninflammatory): Secondary | ICD-10-CM

## 2013-04-28 DIAGNOSIS — Z79899 Other long term (current) drug therapy: Secondary | ICD-10-CM

## 2013-04-28 DIAGNOSIS — I428 Other cardiomyopathies: Secondary | ICD-10-CM

## 2013-04-28 DIAGNOSIS — Z87891 Personal history of nicotine dependence: Secondary | ICD-10-CM | POA: Insufficient documentation

## 2013-04-28 MED ORDER — LISINOPRIL 5 MG PO TABS: 5.0000 mg | ORAL_TABLET | Freq: Every day | ORAL | Status: DC

## 2013-04-28 NOTE — Progress Notes (Signed)
[name] Date of Birth:  11/08/1960 [address] [place], [place]  [address] [phone]         Fax   [phone]  History of Present Illness: This 53 year old African American male is seen back for a post hospital office visit.  He was in his usual state of health until September of this year when he presented with chest pain and was cathetered and showed a totally occluded right and a 50% in-stent restenosis of the circumflex stent.  He had had previous stents to the LAD and circumflex in [place] in 2007.  The patient was readmitted with recurrent anterolateral ST elevation chest pain in October 2014 and was found to have a large pericardial effusion with tablet not.  A pericardial drain was placed.  He was almost ready for discharge when he developed signs of a stroke with left arm weakness and left visual field defect.  He then developed hemodynamic collapse with hypotension and tachycardia and fever of 104 and required intubation.  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 Roxan Hockey.  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  Echo showed moderate pericardial effusion.  He also had recurrent C. difficile colitis.  His ejection fraction by echo was 25% and a sedimentation rate was elevated.  He was discharged to skilled nursing facility and was sent home on week ago.  He is significantly improved clinically.  He is now able to walk around the block.  He is not having any chest pain.  He denies any dizzy spells or syncope.  He still has residual weakness of  the left arm but no other residual weakness.  His blood pressure has improved.  He had an echocardiogram today which shows improvement in his left ventricular ejection fraction up to 30-35%.  He is not having any fevers chills night sweats or other constitutional symptoms.  He is no longer having any diarrhea.  He has finished up his courses of metronidazole for his C. difficile colitis and prednisone for his Dressler's.  Current Outpatient Prescriptions  Medication Sig Dispense Refill  . aspirin 81 MG EC tablet Take 1 tablet (81 mg total) by mouth daily.      . carvedilol (COREG) 3.125 MG tablet Take 1 tablet (3.125 mg total) by mouth 2 (two) times daily with a meal.  60 tablet  0  . clopidogrel (PLAVIX) 75 MG tablet Take 1 tablet (75 mg total) by mouth daily.  30 tablet  11  . famotidine (PEPCID) 20 MG tablet Take 1 tablet (20 mg total) by mouth 2 (two) times daily.  60 tablet  1  . nitroGLYCERIN (NITROSTAT) 0.4 MG SL tablet Place 1 tablet (0.4 mg total) under the tongue every 5 (five) minutes as needed for chest pain (up to 3 doses).  25 tablet  0  . potassium chloride SA (K-DUR,KLOR-CON) 20 MEQ tablet Take 1 tablet (20 mEq total) by mouth 3 (three) times daily.  90 tablet  0  . simvastatin (ZOCOR) 40 MG tablet Take 1 tablet (40 mg total) by mouth at bedtime.  30 tablet  11  .  lisinopril (PRINIVIL,ZESTRIL) 5 MG tablet Take 1 tablet (5 mg total) by mouth daily.  90 tablet  3  . metroNIDAZOLE (FLAGYL) 500 MG tablet Take 1 tablet (500 mg total) by mouth every 8 (eight) hours. Started on the afternoon of 03/20/13. Needs 27 more doses starting 2pm on 03/25/13. (Will complete 14-day course the morning of 04/03/13.)      . predniSONE (DELTASONE) 5 MG tablet Oral taper - take 10mg  daily for 1 week, then 5mg  daily for 1 week, then 2.5mg  daily for 1 week then stop.       No current facility-administered medications for this visit.    No Known Allergies  Patient Active Problem List   Diagnosis Date Noted   . Physical deconditioning 03/25/2013  . Acute respiratory failure   . 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. Acute myopericarditis   . Pericardial effusion   . Coronary artery disease   . Cardiomyopathy   . Dysphagia   . C. difficile colitis   . Orthostatic hypotension   . Hyponatremia   . CVA (cerebral infarction)   . Anemia due to acute blood loss   . Pseudoaneurysm of left ventricle of heart   . Cardiac tamponade   . Tobacco abuse   . Leucocytosis 02/05/2013  . Acute blood loss anemia 02/05/2013  . Hypokalemia 01/07/2013  . Non-STEMI (non-ST elevated myocardial infarction) 01/05/2013    History  Smoking status  . Former Smoker -- 1.00 packs/day for 35 years  . Types: Cigarettes  . Quit date: 01/06/2013  Smokeless tobacco  . Never Used    History  Alcohol Use No    Family History  Problem Relation Age of Onset  . CAD      Review of Systems: Constitutional: no fever chills diaphoresis or fatigue or change in weight.  Head and neck: no hearing loss, no epistaxis, no photophobia or visual disturbance. Respiratory: No cough, shortness of breath or wheezing. Cardiovascular: No chest pain peripheral edema, palpitations. Gastrointestinal: No abdominal distention, no abdominal pain, no change in bowel habits hematochezia or melena. Genitourinary: No dysuria, no frequency, no urgency, no nocturia. Musculoskeletal:No arthralgias, no back pain, no gait disturbance or myalgias. Neurological: No dizziness, no headaches, no numbness, no seizures, no syncope, no weakness, no tremors. Hematologic: No lymphadenopathy, no easy bruising. Psychiatric: No confusion, no hallucinations, no sleep disturbance.    Physical Exam: Filed Vitals:   04/28/13 1611  BP: 112/75  Pulse: 90   the general appearance reveals a well-developed well-nourished gentleman in no distress.  He has residual weakness of the left arm.The head and neck exam reveals pupils equal and reactive.  Extraocular movements are  full.  There is no scleral icterus.  The mouth and pharynx are normal.  The neck is supple.  The carotids reveal no bruits.  The jugular venous pressure is normal.  The  thyroid is not enlarged.  There is no lymphadenopathy.  The chest is clear to percussion and auscultation.  There are no rales or rhonchi.  Expansion of the chest is symmetrical.  The precordium is quiet.  The first heart sound is normal.  The second heart sound is physiologically split.  There is no murmur gallop rub or click.  There is no abnormal lift or heave.  The abdomen is soft and nontender.  The bowel sounds are normal.  The liver and spleen are not enlarged.  There are no abdominal masses.  There are no abdominal bruits.  Extremities reveal good pedal pulses.  There  is no phlebitis or edema.  There is no cyanosis or clubbing.  Strength is normal and symmetrical in all extremities except residual left arm weakness.  There is no lateralizing weakness.  There are no sensory deficits.  The skin is warm and dry.  There is no rash.  EKG shows evidence of an old inferior wall myocardial infarction with Q waves in 23 and aVF and involving inferolateral T wave changes consistent with his recent myocardial infarction and myopericarditis   Assessment / Plan: The patient is improved.  He will continue to increase his activity at home.  He is walking in the neighborhood.  He may start to drive a car short distances now.  His blood pressure is high enough that we will add lisinopril 5 mg one daily for afterload reduction and treatment of his chronic systolic left ventricular dysfunction in an effort to improve his ejection fraction further.  We should also at some point consider outpatient cardiac rehabilitation if he would be interested. Recheck in 3-4 weeks for followup office visit and basal metabolic panel.

## 2013-04-28 NOTE — Patient Instructions (Addendum)
START LISINOPRIL 5 MG DAILY, RX SENT TO [facility]  Ok to drive local short trips  Your physician recommends that you schedule a follow-up appointment in:3-4 weeks ov/bmet

## 2013-04-28 NOTE — Progress Notes (Signed)
Echocardiogram performed.  

## 2013-05-04 ENCOUNTER — Telehealth: Payer: Self-pay | Admitting: Cardiology

## 2013-05-04 NOTE — Telephone Encounter (Signed)
New message    Pt want doctor to arrange for home health come start coming to his home again

## 2013-05-04 NOTE — Telephone Encounter (Signed)
Will right up and fax over after  Dr. Mare Ferrari signs if he feels appropriate to have physical therapy. Patient is still having arm weakness.

## 2013-05-04 NOTE — Telephone Encounter (Signed)
Correction, will write up Rx and fax

## 2013-05-12 ENCOUNTER — Other Ambulatory Visit (INDEPENDENT_AMBULATORY_CARE_PROVIDER_SITE_OTHER): Payer: Medicaid Other

## 2013-05-12 DIAGNOSIS — Z79899 Other long term (current) drug therapy: Secondary | ICD-10-CM

## 2013-05-12 LAB — BASIC METABOLIC PANEL
BUN: 9 mg/dL (ref 6–23)
CALCIUM: 9.2 mg/dL (ref 8.4–10.5)
CO2: 25 mEq/L (ref 19–32)
CREATININE: 0.7 mg/dL (ref 0.4–1.5)
Chloride: 106 mEq/L (ref 96–112)
GFR: 160.31 mL/min (ref >60.00)
Glucose, Bld: 96 mg/dL (ref 70–99)
Potassium: 4.8 mEq/L (ref 3.5–5.1)
SODIUM: 139 meq/L (ref 135–145)

## 2013-05-13 NOTE — Progress Notes (Signed)
Quick Note:  Please report to patient. The recent labs are stable. Continue same medication and careful diet. ______ 

## 2013-05-15 NOTE — Telephone Encounter (Signed)
Left pt a message to call back. 

## 2013-05-15 NOTE — Telephone Encounter (Signed)
Pt is aware of lab results and MD's recommendations.  

## 2013-05-15 NOTE — Telephone Encounter (Signed)
New problem   Pt returning a call from nurse concerning his labs

## 2013-05-25 ENCOUNTER — Ambulatory Visit: Payer: Medicaid Other | Attending: Internal Medicine

## 2013-05-26 ENCOUNTER — Telehealth: Payer: Self-pay | Admitting: Emergency Medicine

## 2013-05-26 DIAGNOSIS — I428 Other cardiomyopathies: Secondary | ICD-10-CM

## 2013-05-26 MED ORDER — LISINOPRIL 5 MG PO TABS: 5.0000 mg | ORAL_TABLET | Freq: Every day | ORAL | Status: DC

## 2013-05-26 MED ORDER — SIMVASTATIN 40 MG PO TABS: 40.0000 mg | ORAL_TABLET | Freq: Every day | ORAL | Status: DC

## 2013-05-26 MED ORDER — FAMOTIDINE 20 MG PO TABS: 20.0000 mg | ORAL_TABLET | Freq: Two times a day (BID) | ORAL | Status: DC

## 2013-05-26 MED ORDER — CARVEDILOL 3.125 MG PO TABS: 3.1250 mg | ORAL_TABLET | Freq: Two times a day (BID) | ORAL | Status: DC

## 2013-05-26 MED ORDER — ASPIRIN 81 MG PO TBEC: 81.0000 mg | DELAYED_RELEASE_TABLET | Freq: Every day | ORAL | Status: DC

## 2013-05-26 MED ORDER — CLOPIDOGREL BISULFATE 75 MG PO TABS: 75.0000 mg | ORAL_TABLET | Freq: Every day | ORAL | Status: DC

## 2013-05-26 NOTE — Telephone Encounter (Signed)
Pt called in requesting medication refill on all meds until seen by provider. Pt scheduled new pt appt 05/19/13 @ 930 am with Dr. Doreene Burke Medication refilled and e-scribed to our pharmacy for free courtesy. Will discuss with Loma Sousa in pharmacy

## 2013-05-26 NOTE — Telephone Encounter (Signed)
Pt called in requesting medication refills on all medications. States he called today to schedule new pt appt, but was told were not taking new pt at this time. Pt has multiple medication list. Will discuss with Dr. Doreene Burke

## 2013-06-03 ENCOUNTER — Telehealth: Payer: Self-pay | Admitting: Internal Medicine

## 2013-06-03 ENCOUNTER — Telehealth: Payer: Self-pay

## 2013-06-03 NOTE — Telephone Encounter (Signed)
Pt called regarding a refill of his potassium medication, please contact pt

## 2013-06-03 NOTE — Telephone Encounter (Signed)
Spoke with patient Medication has been refilled

## 2013-06-04 ENCOUNTER — Ambulatory Visit: Payer: Self-pay | Admitting: Cardiology

## 2013-06-05 ENCOUNTER — Encounter: Payer: Self-pay | Admitting: Cardiology

## 2013-06-05 ENCOUNTER — Ambulatory Visit (INDEPENDENT_AMBULATORY_CARE_PROVIDER_SITE_OTHER): Payer: Medicaid Other | Admitting: Cardiology

## 2013-06-05 VITALS — BP 106/71 | HR 92 | Ht 73.0 in | Wt 212.0 lb

## 2013-06-05 DIAGNOSIS — I428 Other cardiomyopathies: Secondary | ICD-10-CM

## 2013-06-05 DIAGNOSIS — I259 Chronic ischemic heart disease, unspecified: Secondary | ICD-10-CM

## 2013-06-05 DIAGNOSIS — I429 Cardiomyopathy, unspecified: Secondary | ICD-10-CM

## 2013-06-05 DIAGNOSIS — I309 Acute pericarditis, unspecified: Secondary | ICD-10-CM

## 2013-06-05 DIAGNOSIS — I241 Dressler's syndrome: Secondary | ICD-10-CM

## 2013-06-05 DIAGNOSIS — I951 Orthostatic hypotension: Secondary | ICD-10-CM

## 2013-06-05 LAB — BASIC METABOLIC PANEL
BUN: 12 mg/dL (ref 6–23)
CHLORIDE: 106 meq/L (ref 96–112)
CO2: 27 mEq/L (ref 19–32)
Calcium: 9.2 mg/dL (ref 8.4–10.5)
Creatinine, Ser: 0.7 mg/dL (ref 0.4–1.5)
GFR: 147.5 mL/min (ref >60.00)
Glucose, Bld: 77 mg/dL (ref 70–99)
POTASSIUM: 3.9 meq/L (ref 3.5–5.1)
Sodium: 138 mEq/L (ref 135–145)

## 2013-06-05 NOTE — Assessment & Plan Note (Signed)
The patient is not having any signs or symptoms of orthostatic hypotension now.

## 2013-06-05 NOTE — Progress Notes (Signed)
[name] Date of Birth:  01/16/1961 [address] [place], [place]  [address] [phone]         Fax   [phone]  History of Present Illness: This 53 year old African American male is seen back for a scheduled followup office visit. He currently is feeling well.  He was in his usual state of health until September 2014 when he presented with chest pain and was cathetered and showed a totally occluded right and a 50% in-stent restenosis of the circumflex stent.  He had had previous stents to the LAD and circumflex in [place] in 2007.  The patient was readmitted with recurrent anterolateral ST elevation chest pain in October 2014 and was found to have a large pericardial effusion with tablet not.  A pericardial drain was placed.  He was almost ready for discharge when he developed signs of a stroke with left arm weakness and left visual field defect.  He then developed hemodynamic collapse with hypotension and tachycardia and fever of 104 and required intubation.  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 Roxan Hockey.  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  Echo showed moderate pericardial effusion.  He also had recurrent C. difficile colitis.  His ejection fraction by echo was 25% and a sedimentation rate was elevated.  He was discharged to skilled nursing facility and was sent home on week ago.  He is significantly improved clinically.  He is now able to walk around the block.  He is not having any chest pain.  He denies any dizzy spells or syncope.  He still  has residual weakness of the left arm but no other residual weakness.  His blood pressure has improved.  He had an echocardiogram today which shows improvement in his left ventricular ejection fraction up to 30-35%.  He is not having any fevers chills night sweats or other constitutional symptoms.  He is no longer having any diarrhea.  He has finished up his courses of metronidazole for his C. difficile colitis and prednisone for his Dressler's.  At his last visit his blood pressure was high enough to start an ACE inhibitor for his LV systolic dysfunction.  Current Outpatient Prescriptions  Medication Sig Dispense Refill  . aspirin 81 MG EC tablet Take 1 tablet (81 mg total) by mouth daily.  30 tablet  0  . carvedilol (COREG) 3.125 MG tablet Take 1 tablet (3.125 mg total) by mouth 2 (two) times daily with a meal.  60 tablet  2  . clopidogrel (PLAVIX) 75 MG tablet Take 1 tablet (75 mg total) by mouth daily.  30 tablet  0  . famotidine (PEPCID) 20 MG tablet Take 1 tablet (20 mg total) by mouth 2 (two) times daily.  30 tablet  0  . lisinopril (PRINIVIL,ZESTRIL) 5 MG tablet Take 1 tablet (5 mg total) by mouth daily.  30 tablet  0  . nitroGLYCERIN (NITROSTAT) 0.4 MG SL tablet Place 1 tablet (0.4 mg total) under the tongue every 5 (five) minutes as needed for chest pain (up to 3 doses).  25 tablet  0  .  potassium chloride SA (K-DUR,KLOR-CON) 20 MEQ tablet Take 1 tablet (20 mEq total) by mouth 3 (three) times daily.  90 tablet  0  . simvastatin (ZOCOR) 40 MG tablet Take 1 tablet (40 mg total) by mouth at bedtime.  30 tablet  0  . metroNIDAZOLE (FLAGYL) 500 MG tablet Take 1 tablet (500 mg total) by mouth every 8 (eight) hours. Started on the afternoon of 03/20/13. Needs 27 more doses starting 2pm on 03/25/13. (Will complete 14-day course the morning of 04/03/13.)      . predniSONE (DELTASONE) 5 MG tablet Oral taper - take 10mg  daily for 1 week, then 5mg  daily for 1 week, then 2.5mg  daily for 1 week then stop.        No current facility-administered medications for this visit.    No Known Allergies  Patient Active Problem List   Diagnosis Date Noted  . Physical deconditioning 03/25/2013  . Acute respiratory failure   . 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. Acute myopericarditis   . Pericardial effusion   . Coronary artery disease   . Cardiomyopathy   . Dysphagia   . C. difficile colitis   . Orthostatic hypotension   . Hyponatremia   . CVA (cerebral infarction)   . Anemia due to acute blood loss   . Pseudoaneurysm of left ventricle of heart   . Cardiac tamponade   . Tobacco abuse   . Leucocytosis 02/05/2013  . Acute blood loss anemia 02/05/2013  . Hypokalemia 01/07/2013  . Non-STEMI (non-ST elevated myocardial infarction) 01/05/2013    History  Smoking status  . Former Smoker -- 1.00 packs/day for 35 years  . Types: Cigarettes  . Quit date: 01/06/2013  Smokeless tobacco  . Never Used    History  Alcohol Use No    Family History  Problem Relation Age of Onset  . CAD      Review of Systems: Constitutional: no fever chills diaphoresis or fatigue or change in weight.  Head and neck: no hearing loss, no epistaxis, no photophobia or visual disturbance. Respiratory: No cough, shortness of breath or wheezing. Cardiovascular: No chest pain peripheral edema, palpitations. Gastrointestinal: No abdominal distention, no abdominal pain, no change in bowel habits hematochezia or melena. Genitourinary: No dysuria, no frequency, no urgency, no nocturia. Musculoskeletal:No arthralgias, no back pain, no gait disturbance or myalgias. Neurological: No dizziness, no headaches, no numbness, no seizures, no syncope, no weakness, no tremors. Hematologic: No lymphadenopathy, no easy bruising. Psychiatric: No confusion, no hallucinations, no sleep disturbance.    Physical Exam: Filed Vitals:   06/05/13 1357  BP: 106/71  Pulse: 92   the general appearance reveals a well-developed well-nourished  gentleman in no distress.  He has residual weakness of the left arm.The head and neck exam reveals pupils equal and reactive.  Extraocular movements are full.  There is no scleral icterus.  The mouth and pharynx are normal.  The neck is supple.  The carotids reveal no bruits.  The jugular venous pressure is normal.  The  thyroid is not enlarged.  There is no lymphadenopathy.  The chest is clear to percussion and auscultation.  There are no rales or rhonchi.  Expansion of the chest is symmetrical.  The precordium is quiet.  The first heart sound is normal.  The second heart sound is physiologically split.  There is no murmur gallop rub or click.  There is no abnormal lift or heave.  The abdomen is soft and nontender.  The bowel sounds are normal.  The liver and spleen are not enlarged.  There are no abdominal masses.  There are no abdominal bruits.  Extremities reveal good pedal pulses.  There is no phlebitis or edema.  There is no cyanosis or clubbing.  Strength is normal and symmetrical in all extremities except residual left arm weakness.  There is no lateralizing weakness.  There are no sensory deficits.  The skin is warm and dry.  There is no rash.    Assessment / Plan: The patient is improved.  He will continue to increase his activity at home.  He is walking in the neighborhood.  He may start to drive a car short distances now.  His blood pressure is high enough that we will continue lisinopril 5 mg one daily for afterload reduction and treatment of his chronic systolic left ventricular dysfunction in an effort to improve his ejection fraction further.  We should also at some point consider outpatient cardiac rehabilitation if he would be interested. Recheck in 2 months for office visit and EKG.

## 2013-06-05 NOTE — Assessment & Plan Note (Signed)
The patient is not having any symptoms of chest pain or pericarditis or angina pectoris.  His exercise tolerance is improving.

## 2013-06-05 NOTE — Assessment & Plan Note (Signed)
The patient is back on an ACE inhibitor since last visit.  He has not been aware of any side effects from the ACE inhibitor.  We are checking a basal metabolic panel today.  He has an indication for an ACE inhibitor because of his left ventricular systolic dysfunction with ejection fraction 25%

## 2013-06-05 NOTE — Patient Instructions (Signed)
Will obtain labs today and call you with the results (BMET)  Your physician recommends that you continue on your current medications as directed. Please refer to the Current Medication list given to you today.  Your physician recommends that you schedule a follow-up appointment in: [place]

## 2013-06-07 NOTE — Progress Notes (Signed)
Quick Note:  Please report to patient. The recent labs are stable. Continue same medication and careful diet. ______ 

## 2013-06-08 ENCOUNTER — Telehealth: Payer: Self-pay | Admitting: *Deleted

## 2013-06-08 NOTE — Telephone Encounter (Signed)
Message copied by Earvin Hansen on Mon Jun 08, 2013 12:39 PM ------      Message from: Darlin Coco      Created: Sun Jun 07, 2013  8:26 PM       Please report to patient.  The recent labs are stable. Continue same medication and careful diet. ------

## 2013-06-08 NOTE — Telephone Encounter (Signed)
Advised patient of lab results  

## 2013-06-22 ENCOUNTER — Encounter: Payer: Self-pay | Admitting: Internal Medicine

## 2013-06-22 ENCOUNTER — Ambulatory Visit: Payer: Medicaid Other | Attending: Internal Medicine | Admitting: Internal Medicine

## 2013-06-22 VITALS — BP 136/82 | HR 70 | Temp 98.5°F | Resp 16 | Wt 212.0 lb

## 2013-06-22 DIAGNOSIS — I219 Acute myocardial infarction, unspecified: Secondary | ICD-10-CM

## 2013-06-22 DIAGNOSIS — F17200 Nicotine dependence, unspecified, uncomplicated: Secondary | ICD-10-CM | POA: Insufficient documentation

## 2013-06-22 DIAGNOSIS — I428 Other cardiomyopathies: Secondary | ICD-10-CM

## 2013-06-22 DIAGNOSIS — I213 ST elevation (STEMI) myocardial infarction of unspecified site: Secondary | ICD-10-CM | POA: Insufficient documentation

## 2013-06-22 DIAGNOSIS — E785 Hyperlipidemia, unspecified: Secondary | ICD-10-CM | POA: Insufficient documentation

## 2013-06-22 LAB — LIPID PANEL
Cholesterol: 133 mg/dL (ref 0–200)
HDL: 48 mg/dL (ref >39)
LDL Cholesterol: 72 mg/dL (ref 0–99)
Total CHOL/HDL Ratio: 2.8 Ratio
Triglycerides: 64 mg/dL (ref <150)
VLDL: 13 mg/dL (ref 0–40)

## 2013-06-22 LAB — POCT GLYCOSYLATED HEMOGLOBIN (HGB A1C): HEMOGLOBIN A1C: 5.4

## 2013-06-22 MED ORDER — POTASSIUM CHLORIDE CRYS ER 20 MEQ PO TBCR: 20.0000 meq | EXTENDED_RELEASE_TABLET | Freq: Three times a day (TID) | ORAL | Status: DC

## 2013-06-22 MED ORDER — CLOPIDOGREL BISULFATE 75 MG PO TABS: 75.0000 mg | ORAL_TABLET | Freq: Every day | ORAL | Status: DC

## 2013-06-22 MED ORDER — LISINOPRIL 5 MG PO TABS: 5.0000 mg | ORAL_TABLET | Freq: Every day | ORAL | Status: DC

## 2013-06-22 MED ORDER — ASPIRIN 81 MG PO TBEC: 81.0000 mg | DELAYED_RELEASE_TABLET | Freq: Every day | ORAL | Status: DC

## 2013-06-22 NOTE — Patient Instructions (Addendum)
Hypertension As your heart beats, it forces blood through your arteries. This force is your blood pressure. If the pressure is too high, it is called hypertension (HTN) or high blood pressure. HTN is dangerous because you may have it and not know it. High blood pressure may mean that your heart has to work harder to pump blood. Your arteries may be narrow or stiff. The extra work puts you at risk for heart disease, stroke, and other problems.  Blood pressure consists of two numbers, a higher number over a lower, 110/72, for example. It is stated as "110 over 72." The ideal is below 120 for the top number (systolic) and under 80 for the bottom (diastolic). Write down your blood pressure today. You should pay close attention to your blood pressure if you have certain conditions such as:  Heart failure.  Prior heart attack.  Diabetes  Chronic kidney disease.  Prior stroke.  Multiple risk factors for heart disease. To see if you have HTN, your blood pressure should be measured while you are seated with your arm held at the level of the heart. It should be measured at least twice. A one-time elevated blood pressure reading (especially in the Emergency Department) does not mean that you need treatment. There may be conditions in which the blood pressure is different between your right and left arms. It is important to see your caregiver soon for a recheck. Most people have essential hypertension which means that there is not a specific cause. This type of high blood pressure may be lowered by changing lifestyle factors such as:  Stress.  Smoking.  Lack of exercise.  Excessive weight.  Drug/tobacco/alcohol use.  Eating less salt. Most people do not have symptoms from high blood pressure until it has caused damage to the body. Effective treatment can often prevent, delay or reduce that damage. TREATMENT  When a cause has been identified, treatment for high blood pressure is directed at the  cause. There are a large number of medications to treat HTN. These fall into several categories, and your caregiver will help you select the medicines that are best for you. Medications may have side effects. You should review side effects with your caregiver. If your blood pressure stays high after you have made lifestyle changes or started on medicines,   Your medication(s) may need to be changed.  Other problems may need to be addressed.  Be certain you understand your prescriptions, and know how and when to take your medicine.  Be sure to follow up with your caregiver within the time frame advised (usually within two weeks) to have your blood pressure rechecked and to review your medications.  If you are taking more than one medicine to lower your blood pressure, make sure you know how and at what times they should be taken. Taking two medicines at the same time can result in blood pressure that is too low. SEEK IMMEDIATE MEDICAL CARE IF:  You develop a severe headache, blurred or changing vision, or confusion.  You have unusual weakness or numbness, or a faint feeling.  You have severe chest or abdominal pain, vomiting, or breathing problems. MAKE SURE YOU:   Understand these instructions.  Will watch your condition.  Will get help right away if you are not doing well or get worse. Document Released: 04/02/2005 Document Revised: 06/25/2011 Document Reviewed: 11/21/2007 [facility] Patient Information 2014 [place]. DASH Diet The DASH diet stands for "Dietary Approaches to Stop Hypertension." It is a healthy  eating plan that has been shown to reduce high blood pressure (hypertension) in as little as 14 days, while also possibly providing other significant health benefits. These other health benefits include reducing the risk of breast cancer after menopause and reducing the risk of type 2 diabetes, heart disease, colon cancer, and stroke. Health benefits also include weight loss  and slowing kidney failure in patients with chronic kidney disease.  DIET GUIDELINES  Limit salt (sodium). Your diet should contain less than 1500 mg of sodium daily.  Limit refined or processed carbohydrates. Your diet should include mostly whole grains. Desserts and added sugars should be used sparingly.  Include small amounts of heart-healthy fats. These types of fats include nuts, oils, and tub margarine. Limit saturated and trans fats. These fats have been shown to be harmful in the body. CHOOSING FOODS  The following food groups are based on a 2000 calorie diet. See your Registered Dietitian for individual calorie needs. Grains and Grain Products (6 to 8 servings daily)  Eat More Often: Whole-wheat bread, brown rice, whole-grain or wheat pasta, quinoa, popcorn without added fat or salt (air popped).  Eat Less Often: White bread, white pasta, white rice, cornbread. Vegetables (4 to 5 servings daily)  Eat More Often: Fresh, frozen, and canned vegetables. Vegetables may be raw, steamed, roasted, or grilled with a minimal amount of fat.  Eat Less Often/Avoid: Creamed or fried vegetables. Vegetables in a cheese sauce. Fruit (4 to 5 servings daily)  Eat More Often: All fresh, canned (in natural juice), or frozen fruits. Dried fruits without added sugar. One hundred percent fruit juice ( cup [237 mL] daily).  Eat Less Often: Dried fruits with added sugar. Canned fruit in light or heavy syrup. [employer], Fish, and Poultry (2 servings or less daily. One serving is 3 to 4 oz [85-114 g]).  Eat More Often: Ninety percent or leaner ground beef, tenderloin, sirloin. Round cuts of beef, chicken breast, [place] breast. All fish. Grill, bake, or broil your meat. Nothing should be fried.  Eat Less Often/Avoid: Fatty cuts of meat, [place], or chicken leg, thigh, or wing. Fried cuts of meat or fish. Dairy (2 to 3 servings)  Eat More Often: Low-fat or fat-free milk, low-fat plain or light yogurt,  reduced-fat or part-skim cheese.  Eat Less Often/Avoid: Milk (whole, 2%).Whole milk yogurt. Full-fat cheeses. Nuts, Seeds, and Legumes (4 to 5 servings per week)  Eat More Often: All without added salt.  Eat Less Often/Avoid: Salted nuts and seeds, canned beans with added salt. Fats and Sweets (limited)  Eat More Often: Vegetable oils, tub margarines without trans fats, sugar-free gelatin. Mayonnaise and salad dressings.  Eat Less Often/Avoid: Coconut oils, palm oils, butter, stick margarine, cream, half and half, cookies, candy, pie. FOR MORE INFORMATION The Dash Diet Eating Plan: www.dashdiet.org Document Released: 03/22/2011 Document Revised: 06/25/2011 Document Reviewed: 03/22/2011 [facility] Patient Information 2014 [place], [place]. Exercise to Lose Weight Exercise and a healthy diet may help you lose weight. Your doctor may suggest specific exercises. EXERCISE IDEAS AND TIPS  Choose low-cost things you enjoy doing, such as walking, bicycling, or exercising to workout videos.  Take stairs instead of the elevator.  Walk during your lunch break.  Park your car further away from work or school.  Go to a gym or an exercise class.  Start with 5 to 10 minutes of exercise each day. Build up to 30 minutes of exercise 4 to 6 days a week.  Wear shoes with good support and  comfortable clothes.  Stretch before and after working out.  Work out until you breathe harder and your heart beats faster.  Drink extra water when you exercise.  Do not do so much that you hurt yourself, feel dizzy, or get very short of breath. Exercises that burn about 150 calories:  Running 1  miles in 15 minutes.  Playing volleyball for 45 to 60 minutes.  Washing and waxing a car for 45 to 60 minutes.  Playing touch football for 45 minutes.  Walking 1  miles in 35 minutes.  Pushing a stroller 1  miles in 30 minutes.  Playing basketball for 30 minutes.  Raking leaves for 30  minutes.  Bicycling 5 miles in 30 minutes.  Walking 2 miles in 30 minutes.  Dancing for 30 minutes.  Shoveling snow for 15 minutes.  Swimming laps for 20 minutes.  Walking up stairs for 15 minutes.  Bicycling 4 miles in 15 minutes.  Gardening for 30 to 45 minutes.  Jumping rope for 15 minutes.  Washing windows or floors for 45 to 60 minutes. Document Released: 05/05/2010 Document Revised: 06/25/2011 Document Reviewed: 05/05/2010 [facility] Patient Information 2014 [place], [place].

## 2013-06-22 NOTE — Progress Notes (Signed)
Patient here to establish care Has recently been hospitalized for stroke/ heart attack and open Heart surgery Takes medication for HTN and cholesterol

## 2013-06-22 NOTE — Progress Notes (Signed)
Patient ID: [name], male   DOB: [birth year], 53 y.o.   MRN: [id number]   Saeed Toren, is a 53 y.o. male  GGY:[id number]  OJJ:[id number]  DOB - 1960-07-31  CC:  Chief Complaint  Patient presents with  . Establish Care       HPI: [name] is a 53 y.o. male here today to establish medical care. He presents today for a follow up from a hospital visit in February when he had a stroke, myocardial infarction, pericarditis, stent placement, and CABG X1. He was in his usual state of health until September 2014 when he presented with chest pain, he had coronary angiography which showed a totally occluded right Coronary and a 50% in-stent restenosis of the circumflex stent. He had had previous stents to the LAD and circumflex in [place] in 2007. The patient was readmitted with recurrent anterolateral ST elevation chest pain in October 2014 and was found to have a large pericardial effusion for which pericardial drain was placed. He was almost ready for discharge when he developed signs of a stroke with left arm weakness and left visual field defect. He then developed hemodynamic collapse with hypotension and tachycardia and fever of 104 and required intubation. Repeat echo showed complex pericardial effusion with right ventricular collapse, he responded to volume resuscitation and went on to have emergent median sternotomy, repair of ruptured inferoposterior left ventricular pseudoaneurysm with a core matrix patch and CABG x1 with saphenous vein graft to the obtuse marginal 1 ALT by Dr. Roxan Hockey. 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 Echo showed moderate pericardial effusion. He also had recurrent C. difficile colitis. His ejection fraction by echo was 25% and a sedimentation rate was elevated. CT scan showed an old cerebral infarct with subsequent scan that showed multiple  infarcts in different vascular distributions in both hemispheres involving left cerebellum, posterior and medial left parietal lobe, lateral left occipital lobe, left high frontal lobe, right posterior frontal lobe and corona radiata. CT angio neck showed no significant extracranial stenosis. CT angiogram showed no large vessel stenosis. His strokes were thought to be cardioembolic related to his cardiac surgery.  HbA1c was 5.7. Lipid profile showed total cholesterol 164, triglycerides 109, HDL 44 and LDL 98 mg percent.  Patient has done well since discharge from hospital he is living at home. He still has residual weakness on the left side. He feels left-sided vision is improved. 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 He is currently on disability He is presently on aspirin and Plavix. He currently abuses tobacco smoking 1-2 cigarettes daily, and denies any alcohol or drug abuse. Patient has No headache, No chest pain, No abdominal pain - No Nausea, No new weakness tingling or numbness, No Cough - SOB.  No Known Allergies Past Medical History  Diagnosis Date  . Coronary artery disease     a. s/p MI in 2009 in [place] with stenting of the LCX and LAD;  b. 12/2012 NSTEMI/CAD: LM nl, LAD patent prox stent, LCX 50-70 isr (FFR 0.84), RCA dom, 191m EF 40-45%-->Med Rx. c. 01/2013: anterolateral STEMI complicated by pericardial effusion (presumed purulent pericarditis) and tamponade s/p drain, ruptured LV pseudoaneurysm s/p CorMatrix patch, CABGx1 (SVG-OM1), fever, CVA, VDRF, C diff.  . Tobacco abuse     a. quit 12/2012.  . Cardiomyopathy     a.  Sept/Oct 2014: EF ~40% (ICM). b. 03/2013: EF 25-30%. Off ACEI due to hypotension (MIXED NICM/ICM).  . Hyperlipidemia   . Pericardial effusion     a. 01/2013: pericardial effusion (presumed purulent pericarditis) and cardiac tamponade s/p drain. b. Persistent moderate pericardial effusion 03/2013.  . Cardiac tamponade      a. 01/2013 s/p drain.  . Pseudoaneurysm of left ventricle of heart     a. 01/2013:  Ruptured inferoposterior LV pseudoaneurysm s/p CorMatrix patch.  . CVA (cerebral infarction)     a. 01/2013 in setting of prolonged hospitalization - residual L arm weakness.  . C. difficile colitis     a. 01/2013 during prolonged adm. b. Recurred 03/2013.  Marland Kitchen Acute respiratory failure     a. 01/2013: VDRF. b. 03/2013: hypoxia requiring supp O2 during adm, resolved by discharge.  Marland Kitchen Hyponatremia     a. During late 2014.  . 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a. 03/2013: readm with hypoxia, tachycardia, elevated ESR/CRP, elevated troponin with 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and myopericarditis; treated with steroids.  . Acute myopericarditis     a. 03/2013: readm with hypoxia, tachycardia, elevated ESR/CRP, elevated troponin with 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and myopericarditis; treated with steroids.  Marland Kitchen Dysphagia     a. 03/2013: felt possibly due to globus hystericus;barium swallow normal 03/2013, recent normal esophagram 01/2013. GI did not feel any further w/u needed at that time.  . Orthostatic hypotension     a. Profound during 03/2013 admission (BP dropping to 19'E systolic) requiring meds scaled back.  . Physical deconditioning   . Anemia due to acute blood loss     a. 01/2013 hospitalization (s/p surgery).   Current Outpatient Prescriptions on File Prior to Visit  Medication Sig Dispense Refill  . carvedilol (COREG) 3.125 MG tablet Take 1 tablet (3.125 mg total) by mouth 2 (two) times daily with a meal.  60 tablet  2  . famotidine (PEPCID) 20 MG tablet Take 1 tablet (20 mg total) by mouth 2 (two) times daily.  30 tablet  0  . metroNIDAZOLE (FLAGYL) 500 MG tablet Take 1 tablet (500 mg total) by mouth every 8 (eight) hours. Started on the afternoon of 03/20/13. Needs 27 more doses starting 2pm on 03/25/13. (Will complete 14-day course the morning of 04/03/13.)      . nitroGLYCERIN (NITROSTAT) 0.4 MG SL tablet Place 1 tablet (0.4 mg total)  under the tongue every 5 (five) minutes as needed for chest pain (up to 3 doses).  25 tablet  0  . predniSONE (DELTASONE) 5 MG tablet Oral taper - take 61m daily for 1 week, then 573mdaily for 1 week, then 2.51m32maily for 1 week then stop.      . simvastatin (ZOCOR) 40 MG tablet Take 1 tablet (40 mg total) by mouth at bedtime.  30 tablet  0   No current facility-administered medications on file prior to visit.   Family History  Problem Relation Age of Onset  . CAD     History   Social History  . Marital Status: Single    Spouse Name: N/A    Number of Children: N/A  . Years of Education: N/A   Occupational History  . Not on file.   Social History Main Topics  . Smoking status: Former Smoker -- 1.00 packs/day for 35 years    Types: Cigarettes    Quit date: 01/06/2013  . Smokeless tobacco: Never Used  . Alcohol Use: No  . Drug Use: No  . Sexual Activity:  Yes   Other Topics Concern  . Not on file   Social History Narrative   Lives in [place].  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  Quit smoking after MI 12/2012.    Review of Systems: Constitutional: Negative for fever, chills, diaphoresis, activity change, appetite change and fatigue. HENT: Negative for ear pain, nosebleeds, congestion, facial swelling, rhinorrhea, neck pain, neck stiffness and ear discharge.  Eyes: Negative for pain, discharge, redness, itching and visual disturbance. Respiratory: Negative for cough, choking, chest tightness, shortness of breath, wheezing and stridor.  Cardiovascular: Negative for chest pain, palpitations and leg swelling. Gastrointestinal: Negative for abdominal distention. Genitourinary: Negative for dysuria, urgency, frequency, hematuria, flank pain, decreased urine volume, difficulty urinating and dyspareunia.  Musculoskeletal: Negative for back pain, joint swelling, arthralgia and gait problem. Neurological: Negative for dizziness, tremors, seizures, syncope, facial  asymmetry, speech difficulty, weakness, light-headedness, numbness and headaches.  Hematological: Negative for adenopathy. Does not bruise/bleed easily. Psychiatric/Behavioral: Negative for hallucinations, behavioral problems, confusion, dysphoric mood, decreased concentration and agitation.    Objective:   Filed Vitals:   06/22/13 0917  BP: 136/82  Pulse: 70  Temp: 98.5 F (36.9 C)  Resp: 16    Physical Exam: Constitutional: Patient appears well-developed and well-nourished. No distress. HENT: Normocephalic, atraumatic, External right and left ear normal. Oropharynx is clear and moist.  Eyes: Conjunctivae and EOM are normal. PERRLA, no scleral icterus. Neck: Normal ROM. Neck supple. No JVD. No tracheal deviation. No thyromegaly. CVS: RRR, S1/S2 +, + murmurs, no gallops, no carotid bruit.  Pulmonary: Effort and breath sounds normal, no stridor, rhonchi, wheezes, rales.  Abdominal: Soft. BS +, no distension, tenderness, rebound or guarding.  Musculoskeletal: Normal range of motion. No edema and no tenderness.  Lymphadenopathy: No lymphadenopathy noted, cervical, inguinal or axillary Neuro: Diminished left grip strength and fine finger movements, muscle tone coordination.  Skin: Skin is warm and dry. No rash noted. Not diaphoretic. No erythema. No pallor. Psychiatric: Normal mood and affect. Behavior, judgment, thought content normal.  Lab Results  Component Value Date   WBC 9.5 03/24/2013   HGB 11.9* 03/24/2013   HCT 36.4* 03/24/2013   MCV 80.9 03/24/2013   PLT 267 03/24/2013   Lab Results  Component Value Date   CREATININE 0.7 06/05/2013   BUN 12 06/05/2013   NA 138 06/05/2013   K 3.9 06/05/2013   CL 106 06/05/2013   CO2 27 06/05/2013    Lab Results  Component Value Date   HGBA1C 5.7* 01/05/2013   Lab Results  Component Value Date   HGBA1C 5.4 06/22/2013   HGBA1C 5.7* 01/05/2013    Lipid Panel     Component Value Date/Time   CHOL 122 03/19/2013 0323   TRIG 104 03/19/2013  0323   HDL 29* 03/19/2013 0323   CHOLHDL 4.2 03/19/2013 0323   VLDL 21 03/19/2013 0323   LDLCALC 72 03/19/2013 0323       Assessment and plan:   1. Other primary cardiomyopathies Continue - lisinopril (PRINIVIL,ZESTRIL) 5 MG tablet; Take 1 tablet (5 mg total) by mouth daily.  Dispense: 90 tablet; Refill: 3 We have discussed blood pressure goal, his blood pressure below 130/90 mmHg Follow up with cardiologist Patient has appointment with nephrologist coming up  2. ST elevation myocardial infarction (STEMI), subsequent to initial episode of care  - aspirin 81 MG EC tablet; Take 1 tablet (81 mg total) by mouth daily.  Dispense: 90 tablet; Refill: 3 - clopidogrel (PLAVIX) 75 MG tablet; Take  1 tablet (75 mg total) by mouth daily.  Dispense: 90 tablet; Refill: 3 - potassium chloride SA (K-DUR,KLOR-CON) 20 MEQ tablet; Take 1 tablet (20 mEq total) by mouth 3 (three) times daily.  Dispense: 90 tablet; Refill: 3  Follow up with cardiologist as scheduled  3. Dyslipidemia  - POCT glycosylated hemoglobin (Hb A1C) is 5.4% today - Lipid panel  4. Smoking addiction Patient was extensively counseled on smoking cessation  Patient has been counseled about nutrition and exercise as tolerated   Return in about 3 months (around 09/22/2013) for Routine Follow Up, HTN/DM.  The patient was given clear instructions to go to ER or return to medical center if symptoms don't improve, worsen or new problems develop. The patient verbalized understanding. The patient was told to call to get lab results if they haven't heard anything in the next week.     This note has been created with Surveyor, quantity. Any transcriptional errors are unintentional.    Angelica Chessman, MD, [place], [place], [place], [place]   06/22/2013, 10:22 AM

## 2013-06-23 ENCOUNTER — Encounter: Payer: Self-pay | Admitting: Neurology

## 2013-06-23 ENCOUNTER — Ambulatory Visit (INDEPENDENT_AMBULATORY_CARE_PROVIDER_SITE_OTHER): Payer: Self-pay | Admitting: Neurology

## 2013-06-23 VITALS — BP 99/65 | HR 85 | Ht 72.0 in | Wt 211.0 lb

## 2013-06-23 DIAGNOSIS — H5460 Unqualified visual loss, one eye, unspecified: Secondary | ICD-10-CM

## 2013-06-23 DIAGNOSIS — H5461 Unqualified visual loss, right eye, normal vision left eye: Secondary | ICD-10-CM | POA: Insufficient documentation

## 2013-06-23 NOTE — Progress Notes (Signed)
[facility] [address] [place]. [place] [address] [phone]       OFFICE FOLLOW-UP NOTE  [name] Date of Birth:  1961-02-23 Medical Record Number:  [id number]   HPI: 67 year African American male seen for followup hospital consultation for a stroke in October 2014. He was in his usual state of health until September 2014 when he presented with chest pain and was cathetered and showed a totally occluded right  Coronary and a 50% in-stent restenosis of the circumflex stent. He had had previous stents to the LAD and circumflex in [place] in 2007. The patient was readmitted with recurrent anterolateral ST elevation chest pain in October 2014 and was found to have a large pericardial effusion with tablet not. A pericardial drain was placed. He was almost ready for discharge when he developed signs of a stroke with left arm weakness and left visual field defect. He then developed hemodynamic collapse with hypotension and tachycardia and fever of 104 and required intubation. Repeat echo showed complex pericardial effusion with right ventricular collapse in [place] not and he responded to volume resuscitation and went on to have emergent median sternotomy, repair of ruptured inferoposterior left ventricular pseudoaneurysm with a core matrix patch and CABG x1 with saphenous vein graft to the obtuse marginal 1 ALT by Dr. Roxan Hockey. 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 Echo showed moderate pericardial effusion. He also had recurrent C. difficile colitis. His ejection fraction by echo was 25% and a sedimentation rate was elevated.    CT scan showed an old cerebral infarct with subsequent scan this showed multiple infarcts in different vascular distributions in both hemispheres involving left cerebellum, posterior and medial left parietal lobe, lateral left occipital  lobe, left high frontal lobe, right posterior frontal lobe and corona radiata. CT angio neck showed no significant extracranial stenosis. CT angiogram showed no large vessel stenosis. His  strokes were thought to be cardioembolic related to his cardiac surgery.  well as from thromboembolism from necrotic myocardium and intramural clot. HbA1c was 5.7. Lipid profile showed total cholesterol 164, triglycerides 109, HDL 44 and LDL 98 mg percent.  . Patient has done well since discharge his living at home. He still has mild diminished fine motor skills and weakness his left hand. He feels left-sided vision is improved but he now has trouble seeing from his right eye. He seems to be new finding and is not yet seen an ophthalmologist to have this evaluated yet. 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 He is currently on disability  ROS:   14 system review of systems is positive for blurred vision, loss of vision, weakness, gait difficulty and all the systems negative  PMH:  Past Medical History  Diagnosis Date  . Coronary artery disease     a. s/p MI in 2009 in [place] with stenting of the LCX and LAD;  b. 12/2012 NSTEMI/CAD: LM nl, LAD patent prox stent, LCX 50-70 isr (FFR 0.84), RCA dom, 147m EF 40-45%-->Med Rx. c. 01/2013: anterolateral STEMI complicated by pericardial effusion (presumed purulent pericarditis) and tamponade s/p drain, ruptured LV pseudoaneurysm s/p CorMatrix patch, CABGx1 (SVG-OM1), fever, CVA, VDRF, C diff.  . Tobacco abuse     a. quit 12/2012.  . Cardiomyopathy     a. Sept/Oct 2014: EF ~40% (ICM). b. 03/2013: EF 25-30%.  Off ACEI due to hypotension (MIXED NICM/ICM).  . Hyperlipidemia   . Pericardial effusion     a. 01/2013: pericardial effusion (presumed purulent pericarditis) and cardiac tamponade s/p drain. b. Persistent moderate pericardial effusion 03/2013.  . Cardiac tamponade     a. 01/2013 s/p drain.  . Pseudoaneurysm of left  ventricle of heart     a. 01/2013:  Ruptured inferoposterior LV pseudoaneurysm s/p CorMatrix patch.  . CVA (cerebral infarction)     a. 01/2013 in setting of prolonged hospitalization - residual L arm weakness.  . C. difficile colitis     a. 01/2013 during prolonged adm. b. Recurred 03/2013.  Marland Kitchen Acute respiratory failure     a. 01/2013: VDRF. b. 03/2013: hypoxia requiring supp O2 during adm, resolved by discharge.  Marland Kitchen Hyponatremia     a. During late 2014.  . 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a. 03/2013: readm with hypoxia, tachycardia, elevated ESR/CRP, elevated troponin with 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and myopericarditis; treated with steroids.  . Acute myopericarditis     a. 03/2013: readm with hypoxia, tachycardia, elevated ESR/CRP, elevated troponin with 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and myopericarditis; treated with steroids.  Marland Kitchen Dysphagia     a. 03/2013: felt possibly due to globus hystericus;barium swallow normal 03/2013, recent normal esophagram 01/2013. GI did not feel any further w/u needed at that time.  . Orthostatic hypotension     a. Profound during 03/2013 admission (BP dropping to 95'K systolic) requiring meds scaled back.  . Physical deconditioning   . Anemia due to acute blood loss     a. 01/2013 hospitalization (s/p surgery).    Social History:  History   Social History  . Marital Status: Single    Spouse Name: N/A    Number of Children: 2  . Years of Education: 12th   Occupational History  . N/A    Social History Main Topics  . Smoking status: Former Smoker -- 1.00 packs/day for 35 years    Types: Cigarettes    Quit date: 01/06/2013  . Smokeless tobacco: Never Used  . Alcohol Use: No  . Drug Use: No  . Sexual Activity: Yes   Other Topics Concern  . Not on file   Social History Narrative   Lives in [place].  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  Quit smoking after MI 12/2012.    Medications:   Current Outpatient Prescriptions on File Prior to Visit  Medication Sig  Dispense Refill  . aspirin 81 MG EC tablet Take 1 tablet (81 mg total) by mouth daily.  90 tablet  3  . carvedilol (COREG) 3.125 MG tablet Take 1 tablet (3.125 mg total) by mouth 2 (two) times daily with a meal.  60 tablet  2  . clopidogrel (PLAVIX) 75 MG tablet Take 1 tablet (75 mg total) by mouth daily.  90 tablet  3  . famotidine (PEPCID) 20 MG tablet Take 1 tablet (20 mg total) by mouth 2 (two) times daily.  30 tablet  0  . lisinopril (PRINIVIL,ZESTRIL) 5 MG tablet Take 1 tablet (5 mg total) by mouth daily.  90 tablet  3  . nitroGLYCERIN (NITROSTAT) 0.4 MG SL tablet Place 1 tablet (0.4 mg total) under the tongue every 5 (five) minutes as needed for chest pain (up to 3 doses).  25 tablet  0  . potassium chloride SA (K-DUR,KLOR-CON) 20 MEQ tablet Take 1 tablet (20 mEq total) by mouth 3 (three) times daily.  90 tablet  3  . predniSONE (  DELTASONE) 5 MG tablet Oral taper - take 66m daily for 1 week, then 520mdaily for 1 week, then 2.35m535maily for 1 week then stop.      . simvastatin (ZOCOR) 40 MG tablet Take 1 tablet (40 mg total) by mouth at bedtime.  30 tablet  0   No current facility-administered medications on file prior to visit.    Allergies:  No Known Allergies  Physical Exam General: well developed, well nourished [place], seated, in no evident distress Head: head normocephalic and atraumatic. Orohparynx benign Neck: supple with no carotid or supraclavicular bruits Cardiovascular: regular rate and rhythm, no murmurs Musculoskeletal: no deformity Skin:  no rash/petichiae Vascular:  Normal pulses all extremities Filed Vitals:   06/23/13 1445  BP: 99/65  Pulse: 85   Neurologic Exam Mental Status: Awake and fully alert. Oriented to place and time. Recent and remote memory intact. Attention span, concentration and fund of knowledge appropriate. Mood and affect appropriate.  Cranial Nerves: Fundoscopic exam reveals sharp disc margins. Pupils equal,  reactive to light.  Extraocular movements full without nystagmus. Visual fields full to confrontation. On left but diminished acuity in right eye limits field on right.Fundi not visualized. Hearing intact. Facial sensation intact. Face, tongue, palate moves normally and symmetrically.  Motor: Normal bulk and tone. Diminished left grip strength and fine finger movements. Orbits right over left upper extremity. drags left leg minimally when walking.  Sensory.: intact to touch and pinprick and vibratory sensation.  Coordination: Rapid alternating movements abnormal in left extremities. Finger-to-nose and heel-to-shin performed slowly on left Gait and Station: Arises from chair without difficulty. Stance is normal. Gait demonstrates normal stride length and balance . Able to heel, toe and tandem walk without difficulty.  Reflexes: 1+ and asymmetric brisker on left. Toes downgoing.   NIHSS  3 Modified Rankin  2   ASSESSMENT:  [age]ar [place] multiple infarcts involving left superior cerebellum, posterior and medial left parietal lobe and left lateral occipital as well as left frontal lobe and right frontal lobe and corona radiata and October 20 14 cardioembolic etiology following pericardial tamponade requiring cardiac surgery for. removal of necrotic myocardium and repair of inferoposterior pseudoaneurysm. He has done quite well with only mild residual left hemiparesis and poor vision in right eye.   PLAN: I had a long discussion the patient regarding his stroke and hospitalization as well as answered questions about his evaluation and secondary stroke prevention. Continue aspirin and Plavix for stroke prevention given his significant cardiac disease and maintain strict control of hypertension with blood pressure goal below 130/90 and lipids with LDL cholesterol goal below 100 mg percent. I advised him not to drive till his right eye vision improves. I advised him to see ophthalmologist for that. Return for  followup in 4 months with LynAlphonsus Sias.p.or. call earlier if necessary    Note: This document was prepared with digital dictation and possible smart phrase technology. Any transcriptional errors that result from this process are unintentional

## 2013-06-23 NOTE — Patient Instructions (Signed)
I had a long discussion the patient regarding his stroke and hospitalization as well as answered questions about his evaluation and secondary stroke prevention. Continue aspirin and Plavix for stroke prevention given his significant cardiac disease and maintain strict control of hypertension with blood pressure goal below 130/90 and lipids with LDL cholesterol goal below 100 mg percent. I advised him not to drive till his right eye vision improves. I advised him to see ophthalmologist for that. Return for followup in 4 months with Alphonsus Sias, n.p.or. call earlier if necessary

## 2013-06-24 ENCOUNTER — Other Ambulatory Visit: Payer: Self-pay | Admitting: Internal Medicine

## 2013-06-25 NOTE — Telephone Encounter (Signed)
Pt calling about med refill, pharmacy says they still have not received script.

## 2013-06-26 ENCOUNTER — Encounter: Payer: Self-pay | Admitting: Cardiology

## 2013-06-26 ENCOUNTER — Ambulatory Visit (HOSPITAL_COMMUNITY): Payer: Medicaid Other | Attending: Cardiology | Admitting: Radiology

## 2013-06-26 ENCOUNTER — Telehealth: Payer: Self-pay | Admitting: Internal Medicine

## 2013-06-26 ENCOUNTER — Other Ambulatory Visit: Payer: Self-pay | Admitting: Emergency Medicine

## 2013-06-26 DIAGNOSIS — I309 Acute pericarditis, unspecified: Secondary | ICD-10-CM | POA: Insufficient documentation

## 2013-06-26 DIAGNOSIS — I2589 Other forms of chronic ischemic heart disease: Secondary | ICD-10-CM

## 2013-06-26 DIAGNOSIS — I639 Cerebral infarction, unspecified: Secondary | ICD-10-CM

## 2013-06-26 DIAGNOSIS — R9430 Abnormal result of cardiovascular function study, unspecified: Secondary | ICD-10-CM

## 2013-06-26 DIAGNOSIS — I241 Dressler's syndrome: Secondary | ICD-10-CM

## 2013-06-26 DIAGNOSIS — I2510 Atherosclerotic heart disease of native coronary artery without angina pectoris: Secondary | ICD-10-CM

## 2013-06-26 MED ORDER — SIMVASTATIN 40 MG PO TABS: 40.0000 mg | ORAL_TABLET | Freq: Every day | ORAL | Status: DC

## 2013-06-26 MED ORDER — CARVEDILOL 3.125 MG PO TABS: 3.1250 mg | ORAL_TABLET | Freq: Two times a day (BID) | ORAL | Status: DC

## 2013-06-26 NOTE — Progress Notes (Signed)
Echocardiogram performed.  

## 2013-06-26 NOTE — Telephone Encounter (Signed)
Pt called regarding a refill of his medication simvastatin (ZOCOR) 40 MG, please contact pt

## 2013-06-29 NOTE — Telephone Encounter (Signed)
Pt given lab results 

## 2013-06-29 NOTE — Telephone Encounter (Signed)
Message copied by Ricci Barker on Mon Jun 29, 2013  1:00 PM ------      Message from: Tresa Garter      Created: Sun Jun 28, 2013  8:58 PM       Please call to inform patient that his lipid panel and hemoglobin A1c are all normal, he is not diabetic ------

## 2013-06-30 ENCOUNTER — Telehealth: Payer: Self-pay | Admitting: *Deleted

## 2013-06-30 MED ORDER — CARVEDILOL 6.25 MG PO TABS: 6.2500 mg | ORAL_TABLET | Freq: Two times a day (BID) | ORAL | Status: DC

## 2013-06-30 NOTE — Telephone Encounter (Signed)
Advised patient of results and medication changes  

## 2013-06-30 NOTE — Telephone Encounter (Signed)
Message copied by Earvin Hansen on Tue Jun 30, 2013  2:25 PM ------      Message from: Darlin Coco      Created: Sat Jun 27, 2013  3:39 PM       Please report.  The EF on this study is lower 25-30 %.  I want him to increase carvedilol to 6.25 mg BID ------

## 2013-07-21 ENCOUNTER — Telehealth: Payer: Self-pay | Admitting: Internal Medicine

## 2013-07-21 DIAGNOSIS — I639 Cerebral infarction, unspecified: Secondary | ICD-10-CM

## 2013-07-21 DIAGNOSIS — R29898 Other symptoms and signs involving the musculoskeletal system: Secondary | ICD-10-CM

## 2013-07-21 NOTE — Telephone Encounter (Signed)
Pt's Medicaid was recently activated and needs referral for physical therapy.  Pt not sure if he needs another appt since he was here a month ago or if referral can be sent based on notes from previous visit.  Please f/u with pt.

## 2013-07-22 NOTE — Telephone Encounter (Signed)
Pt calling again regarding same issue.

## 2013-07-24 NOTE — Telephone Encounter (Signed)
Pt calling again regarding therapy for arm.

## 2013-07-28 NOTE — Telephone Encounter (Signed)
Pt. needs a referral for his arm, possibly therapy...pt. has an appt. with Dr. Doreene Burke on 09/22/13, we don't have any sooner appt.'s. could pt. get a referral before his appt.Marland KitchenMarland KitchenPlease call pt.

## 2013-07-29 ENCOUNTER — Telehealth: Payer: Self-pay | Admitting: Cardiology

## 2013-07-29 DIAGNOSIS — I639 Cerebral infarction, unspecified: Secondary | ICD-10-CM

## 2013-07-29 NOTE — Telephone Encounter (Signed)
LMTCB -4/15

## 2013-07-29 NOTE — Telephone Encounter (Signed)
New Message  Pt called. Requests a call back to discuss having physical therapy for his left arm. Please call

## 2013-07-29 NOTE — Telephone Encounter (Signed)
Pt called back about wanting Dr. Mare Ferrari to set up PT for his left arm. Pt states that he had a stroke in Oct 2014 and lost movement in his left arm. Pt states that nobody since then has set up PT for him. Pt then proceeded to say "my medicaid just came through." Told pt I will forward this message to Dr. Mare Ferrari and his nurse for review. Pt verbalized understanding and agrees with this plan.

## 2013-07-29 NOTE — Telephone Encounter (Signed)
Talked to pt in regards to setting up Outpatient PT for his left arm. Dr. Rozetta Nunnery for pt to receive Outpatient PT. Told pt that someone from scheduling would be calling him to arrange his PT. Pt verbalized understanding.

## 2013-07-29 NOTE — Telephone Encounter (Signed)
Yes please request outpatient PT for his left arm for his stroke.

## 2013-08-05 ENCOUNTER — Encounter: Payer: Self-pay | Admitting: Cardiology

## 2013-08-05 ENCOUNTER — Ambulatory Visit (INDEPENDENT_AMBULATORY_CARE_PROVIDER_SITE_OTHER): Payer: Medicaid Other | Admitting: Cardiology

## 2013-08-05 VITALS — BP 120/70 | HR 81 | Ht 72.0 in | Wt 213.0 lb

## 2013-08-05 DIAGNOSIS — I951 Orthostatic hypotension: Secondary | ICD-10-CM

## 2013-08-05 DIAGNOSIS — R0989 Other specified symptoms and signs involving the circulatory and respiratory systems: Secondary | ICD-10-CM

## 2013-08-05 DIAGNOSIS — I241 Dressler's syndrome: Secondary | ICD-10-CM

## 2013-08-05 DIAGNOSIS — R9430 Abnormal result of cardiovascular function study, unspecified: Secondary | ICD-10-CM

## 2013-08-05 DIAGNOSIS — I259 Chronic ischemic heart disease, unspecified: Secondary | ICD-10-CM

## 2013-08-05 DIAGNOSIS — I2510 Atherosclerotic heart disease of native coronary artery without angina pectoris: Secondary | ICD-10-CM

## 2013-08-05 DIAGNOSIS — I639 Cerebral infarction, unspecified: Secondary | ICD-10-CM

## 2013-08-05 DIAGNOSIS — I6350 Cerebral infarction due to unspecified occlusion or stenosis of unspecified cerebral artery: Secondary | ICD-10-CM

## 2013-08-05 MED ORDER — CARVEDILOL 12.5 MG PO TABS: 12.5000 mg | ORAL_TABLET | Freq: Two times a day (BID) | ORAL | Status: DC

## 2013-08-05 NOTE — Progress Notes (Signed)
[name] Date of Birth:  09/03/1960 [address] [place], [place]  [address] [phone]         Fax   [phone]  History of Present Illness: This 53 year old African American male is seen back for a scheduled followup office visit. He currently is feeling well.  He was in his usual state of health until September 2014 when he presented with chest pain and was cathetered and showed a totally occluded right and a 50% in-stent restenosis of the circumflex stent.  He had had previous stents to the LAD and circumflex in [place] in 2007.  The patient was readmitted with recurrent anterolateral ST elevation chest pain in October 2014 and was found to have a large pericardial effusion with tablet not.  A pericardial drain was placed.  He was almost ready for discharge when he developed signs of a stroke with left arm weakness and left visual field defect.  He then developed hemodynamic collapse with hypotension and tachycardia and fever of 104 and required intubation.  Repeat echo showed complex pericardial effusion with right ventricular collapse in [place] not and he responded to volume resuscitation and went on to have emergent median sternotomy, repair of ruptured inferoposterior left ventricular pseudoaneurysm with a core matrix patch and CABG x1 with saphenous vein graft to the obtuse marginal 1 ALT by Dr. Roxan Hockey.  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  Echo showed moderate pericardial effusion.  He also had recurrent C. difficile colitis.  His ejection fraction by echo was 25% and a sedimentation rate was elevated.  He was discharged to skilled nursing facility and was sent home on week ago.  He is significantly improved clinically.  He is now able to walk around the block.  He is not having any chest pain.  He denies any dizzy spells or syncope.  He  still has residual weakness of the left arm but no other residual weakness.  His blood pressure has improved.  He had an echocardiogram today which shows improvement in his left ventricular ejection fraction up to 30-35%.  He is not having any fevers chills night sweats or other constitutional symptoms.  He is no longer having any diarrhea.  He has finished up his courses of metronidazole for his C. difficile colitis and prednisone for his Dressler's.  At his last visit his blood pressure was high enough to start an ACE inhibitor for his LV systolic dysfunction.  Since last visit he has been feeling well except for residual left arm weakness from his previous stroke.  Current Outpatient Prescriptions  Medication Sig Dispense Refill  . aspirin 81 MG EC tablet Take 1 tablet (81 mg total) by mouth daily.  90 tablet  3  . carvedilol (COREG) 12.5 MG tablet Take 1 tablet (12.5 mg total) by mouth 2 (two) times daily with a meal.  60 tablet  5  . clopidogrel (PLAVIX) 75 MG tablet Take 1 tablet (75 mg total) by mouth daily.  90 tablet  3  . famotidine (PEPCID) 20 MG tablet Take 1 tablet (20 mg total) by mouth 2 (two) times daily.  30 tablet  0  . lisinopril (PRINIVIL,ZESTRIL) 5 MG tablet Take 1 tablet (5 mg total) by mouth daily.  90 tablet  3  . nitroGLYCERIN (NITROSTAT) 0.4 MG SL tablet Place 1 tablet (0.4 mg total) under the tongue  every 5 (five) minutes as needed for chest pain (up to 3 doses).  25 tablet  0  . potassium chloride SA (K-DUR,KLOR-CON) 20 MEQ tablet Take 1 tablet (20 mEq total) by mouth 3 (three) times daily.  90 tablet  3  . predniSONE (DELTASONE) 5 MG tablet Oral taper - take 10mg  daily for 1 week, then 5mg  daily for 1 week, then 2.5mg  daily for 1 week then stop.       No current facility-administered medications for this visit.    No Known Allergies  Patient Active Problem List   Diagnosis Date Noted  . Vision loss of right eye 06/23/2013  . Other primary cardiomyopathies 06/22/2013    . ST elevation myocardial infarction (STEMI), subsequent to initial episode of care 06/22/2013  . Dyslipidemia 06/22/2013  . Smoking addiction 06/22/2013  . Physical deconditioning 03/25/2013  . Acute respiratory failure   . 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. Acute myopericarditis   . Pericardial effusion   . Coronary artery disease   . Cardiomyopathy   . Dysphagia   . C. difficile colitis   . Orthostatic hypotension   . Hyponatremia   . CVA (cerebral infarction)   . Anemia due to acute blood loss   . Pseudoaneurysm of left ventricle of heart   . Cardiac tamponade   . Tobacco abuse   . Leucocytosis 02/05/2013  . Acute blood loss anemia 02/05/2013  . Hypokalemia 01/07/2013  . Non-STEMI (non-ST elevated myocardial infarction) 01/05/2013    History  Smoking status  . Former Smoker -- 1.00 packs/day for 35 years  . Types: Cigarettes  . Quit date: 01/06/2013  Smokeless tobacco  . Never Used    History  Alcohol Use No    Family History  Problem Relation Age of Onset  . CAD    . Heart attack Mother     Review of Systems: Constitutional: no fever chills diaphoresis or fatigue or change in weight.  Head and neck: no hearing loss, no epistaxis, no photophobia or visual disturbance. Respiratory: No cough, shortness of breath or wheezing. Cardiovascular: No chest pain peripheral edema, palpitations. Gastrointestinal: No abdominal distention, no abdominal pain, no change in bowel habits hematochezia or melena. Genitourinary: No dysuria, no frequency, no urgency, no nocturia. Musculoskeletal:No arthralgias, no back pain, no gait disturbance or myalgias. Neurological: No dizziness, no headaches, no numbness, no seizures, no syncope, no weakness, no tremors. Hematologic: No lymphadenopathy, no easy bruising. Psychiatric: No confusion, no hallucinations, no sleep disturbance.    Physical Exam: Filed Vitals:   08/05/13 1507  BP: 120/70  Pulse: 81   the general appearance reveals a  well-developed well-nourished gentleman in no distress.  He has residual weakness of the left arm.The head and neck exam reveals pupils equal and reactive.  Extraocular movements are full.  There is no scleral icterus.  The mouth and pharynx are normal.  The neck is supple.  The carotids reveal no bruits.  The jugular venous pressure is normal.  The  thyroid is not enlarged.  There is no lymphadenopathy.  The chest is clear to percussion and auscultation.  There are no rales or rhonchi.  Expansion of the chest is symmetrical.  The precordium is quiet.  The first heart sound is normal.  The second heart sound is physiologically split.  There is no murmur gallop rub or click.  There is no abnormal lift or heave.  The abdomen is soft and nontender.  The bowel sounds are normal.  The liver and  spleen are not enlarged.  There are no abdominal masses.  There are no abdominal bruits.  Extremities reveal good pedal pulses.  There is no phlebitis or edema.  There is no cyanosis or clubbing.  Strength is normal and symmetrical in all extremities except residual left arm weakness.  There is no lateralizing weakness.  There are no sensory deficits.  The skin is warm and dry.  There is no rash.  EKG shows normal sinus rhythm at 81 per minute.  Since 04/28/13, the widespread T-wave changes have improved slightly  Assessment / Plan: The patient is to continue same medication except we will try to increase his carvedilol up to 12.5 mg twice a day.  We will plan to get another echo in about 2 months to follow his ejection fraction.  We will see him in 2 months for followup office visit also.  Continue same low dose lisinopril. We will order outpatient physical therapy for his left arm.

## 2013-08-05 NOTE — Assessment & Plan Note (Signed)
No fever chills or pleuritic chest pain and he was successfully weaned off steroids

## 2013-08-05 NOTE — Assessment & Plan Note (Signed)
The patient is not having any recurrent chest pain or angina 

## 2013-08-05 NOTE — Patient Instructions (Signed)
INCREASE YOUR CARVEDILOL TO 12.5 MG TWICE A DAY  Will arrange outpatient rehab for your arm  Your physician has requested that you have an echocardiogram. Echocardiography is a painless test that uses sound waves to create images of your heart. It provides your doctor with information about the size and shape of your heart and how well your heart's chambers and valves are working. This procedure takes approximately one hour. There are no restrictions for this procedure. June 10 AT 3:30  See  Dr. Mare Ferrari in June 18 at 4:15

## 2013-08-05 NOTE — Assessment & Plan Note (Signed)
Patient's blood pressure is now stable.  He is not having any orthostatic dizziness or syncope.

## 2013-08-05 NOTE — Assessment & Plan Note (Signed)
The patient made good progress with physical therapy in the hospital regarding his left arm weakness.  However, since going home he feels like he has lost ground with his left arm again.  He is requesting further physical therapy.

## 2013-08-06 ENCOUNTER — Emergency Department (HOSPITAL_COMMUNITY): Payer: Medicaid Other

## 2013-08-06 ENCOUNTER — Other Ambulatory Visit: Payer: Self-pay

## 2013-08-06 ENCOUNTER — Emergency Department (HOSPITAL_COMMUNITY)
Admission: EM | Admit: 2013-08-06 | Discharge: 2013-08-07 | Disposition: A | Discharge status: Home / Self Care | Payer: Medicaid Other | Attending: Emergency Medicine | Admitting: Emergency Medicine

## 2013-08-06 ENCOUNTER — Encounter (HOSPITAL_COMMUNITY): Payer: Self-pay | Admitting: Emergency Medicine

## 2013-08-06 DIAGNOSIS — Z8639 Personal history of other endocrine, nutritional and metabolic disease: Secondary | ICD-10-CM | POA: Insufficient documentation

## 2013-08-06 DIAGNOSIS — Z862 Personal history of diseases of the blood and blood-forming organs and certain disorders involving the immune mechanism: Secondary | ICD-10-CM | POA: Insufficient documentation

## 2013-08-06 DIAGNOSIS — Z9889 Other specified postprocedural states: Secondary | ICD-10-CM | POA: Insufficient documentation

## 2013-08-06 DIAGNOSIS — R079 Chest pain, unspecified: Secondary | ICD-10-CM

## 2013-08-06 DIAGNOSIS — R072 Precordial pain: Secondary | ICD-10-CM | POA: Insufficient documentation

## 2013-08-06 DIAGNOSIS — Z9861 Coronary angioplasty status: Secondary | ICD-10-CM | POA: Insufficient documentation

## 2013-08-06 DIAGNOSIS — R112 Nausea with vomiting, unspecified: Secondary | ICD-10-CM | POA: Insufficient documentation

## 2013-08-06 DIAGNOSIS — Z87891 Personal history of nicotine dependence: Secondary | ICD-10-CM | POA: Insufficient documentation

## 2013-08-06 DIAGNOSIS — Z8619 Personal history of other infectious and parasitic diseases: Secondary | ICD-10-CM | POA: Insufficient documentation

## 2013-08-06 DIAGNOSIS — Z79899 Other long term (current) drug therapy: Secondary | ICD-10-CM | POA: Insufficient documentation

## 2013-08-06 DIAGNOSIS — Z8709 Personal history of other diseases of the respiratory system: Secondary | ICD-10-CM | POA: Insufficient documentation

## 2013-08-06 DIAGNOSIS — I252 Old myocardial infarction: Secondary | ICD-10-CM | POA: Insufficient documentation

## 2013-08-06 DIAGNOSIS — Z8673 Personal history of transient ischemic attack (TIA), and cerebral infarction without residual deficits: Secondary | ICD-10-CM | POA: Insufficient documentation

## 2013-08-06 DIAGNOSIS — Z7902 Long term (current) use of antithrombotics/antiplatelets: Secondary | ICD-10-CM | POA: Insufficient documentation

## 2013-08-06 DIAGNOSIS — Z951 Presence of aortocoronary bypass graft: Secondary | ICD-10-CM | POA: Insufficient documentation

## 2013-08-06 DIAGNOSIS — I2510 Atherosclerotic heart disease of native coronary artery without angina pectoris: Secondary | ICD-10-CM | POA: Insufficient documentation

## 2013-08-06 DIAGNOSIS — Z7982 Long term (current) use of aspirin: Secondary | ICD-10-CM | POA: Insufficient documentation

## 2013-08-06 LAB — I-STAT TROPONIN, ED
TROPONIN I, POC: 0 ng/mL (ref 0.00–0.08)
Troponin i, poc: 0 ng/mL (ref 0.00–0.08)

## 2013-08-06 LAB — BASIC METABOLIC PANEL
BUN: 11 mg/dL (ref 6–23)
CHLORIDE: 100 meq/L (ref 96–112)
CO2: 18 mEq/L — ABNORMAL LOW (ref 19–32)
Calcium: 9.8 mg/dL (ref 8.4–10.5)
Creatinine, Ser: 0.75 mg/dL (ref 0.50–1.35)
GFR calc Af Amer: >90 mL/min (ref >90)
Glucose, Bld: 112 mg/dL — ABNORMAL HIGH (ref 70–99)
POTASSIUM: 4.2 meq/L (ref 3.7–5.3)
Sodium: 135 mEq/L — ABNORMAL LOW (ref 137–147)

## 2013-08-06 LAB — CBC
HEMATOCRIT: 47.3 % (ref 39.0–52.0)
HEMOGLOBIN: 16.2 g/dL (ref 13.0–17.0)
MCH: 27.1 pg (ref 26.0–34.0)
MCHC: 34.2 g/dL (ref 30.0–36.0)
MCV: 79.2 fL (ref 78.0–100.0)
Platelets: 207 10*3/uL (ref 150–400)
RBC: 5.97 MIL/uL — ABNORMAL HIGH (ref 4.22–5.81)
RDW: 14.2 % (ref 11.5–15.5)
WBC: 5.8 10*3/uL (ref 4.0–10.5)

## 2013-08-06 LAB — PRO B NATRIURETIC PEPTIDE: Pro B Natriuretic peptide (BNP): 1203 pg/mL — ABNORMAL HIGH (ref 0–125)

## 2013-08-06 MED ORDER — ONDANSETRON HCL 4 MG PO TABS: 4.0000 mg | ORAL_TABLET | Freq: Four times a day (QID) | ORAL | Status: DC

## 2013-08-06 MED ORDER — SODIUM CHLORIDE 0.9 % IV BOLUS (SEPSIS)
1000.0000 mL | Freq: Once | INTRAVENOUS | Status: AC
  Administered 2013-08-06: 1000 mL via INTRAVENOUS

## 2013-08-06 MED ORDER — ONDANSETRON HCL 4 MG/2ML IJ SOLN
4.0000 mg | Freq: Once | INTRAMUSCULAR | Status: AC
Start: 2013-08-06 — End: 2013-08-06
  Administered 2013-08-06: 4 mg via INTRAVENOUS
  Filled 2013-08-06: qty 2

## 2013-08-06 MED ORDER — ONDANSETRON HCL 4 MG/2ML IJ SOLN
4.0000 mg | Freq: Once | INTRAMUSCULAR | Status: AC | PRN
  Administered 2013-08-06: 4 mg via INTRAVENOUS
  Filled 2013-08-06: qty 2

## 2013-08-06 NOTE — ED Notes (Signed)
Pt reports sob since this am, had episode of left side chest pain this am which has since resolved. No acute distress noted, hx of MI and CVA.

## 2013-08-06 NOTE — Discharge Instructions (Signed)
Chest Pain (Nonspecific) °It is often hard to give a specific diagnosis for the cause of chest pain. There is always a chance that your pain could be related to something serious, such as a heart attack or a blood clot in the lungs. You need to follow up with your caregiver for further evaluation. °CAUSES  °· Heartburn. °· Pneumonia or bronchitis. °· Anxiety or stress. °· Inflammation around your heart (pericarditis) or lung (pleuritis or pleurisy). °· A blood clot in the lung. °· A collapsed lung (pneumothorax). It can develop suddenly on its own (spontaneous pneumothorax) or from injury (trauma) to the chest. °· Shingles infection (herpes zoster virus). °The chest wall is composed of bones, muscles, and cartilage. Any of these can be the source of the pain. °· The bones can be bruised by injury. °· The muscles or cartilage can be strained by coughing or overwork. °· The cartilage can be affected by inflammation and become sore (costochondritis). °DIAGNOSIS  °Lab tests or other studies, such as X-rays, electrocardiography, stress testing, or cardiac imaging, may be needed to find the cause of your pain.  °TREATMENT  °· Treatment depends on what may be causing your chest pain. Treatment may include: °· Acid blockers for heartburn. °· Anti-inflammatory medicine. °· Pain medicine for inflammatory conditions. °· Antibiotics if an infection is present. °· You may be advised to change lifestyle habits. This includes stopping smoking and avoiding alcohol, caffeine, and chocolate. °· You may be advised to keep your head raised (elevated) when sleeping. This reduces the chance of acid going backward from your stomach into your esophagus. °· Most of the time, nonspecific chest pain will improve within 2 to 3 days with rest and mild pain medicine. °HOME CARE INSTRUCTIONS  °· If antibiotics were prescribed, take your antibiotics as directed. Finish them even if you start to feel better. °· For the next few days, avoid physical  activities that bring on chest pain. Continue physical activities as directed. °· Do not smoke. °· Avoid drinking alcohol. °· Only take over-the-counter or prescription medicine for pain, discomfort, or fever as directed by your caregiver. °· Follow your caregiver's suggestions for further testing if your chest pain does not go away. °· Keep any follow-up appointments you made. If you do not go to an appointment, you could develop lasting (chronic) problems with pain. If there is any problem keeping an appointment, you must call to reschedule. °SEEK MEDICAL CARE IF:  °· You think you are having problems from the medicine you are taking. Read your medicine instructions carefully. °· Your chest pain does not go away, even after treatment. °· You develop a rash with blisters on your chest. °SEEK IMMEDIATE MEDICAL CARE IF:  °· You have increased chest pain or pain that spreads to your arm, neck, jaw, back, or abdomen. °· You develop shortness of breath, an increasing cough, or you are coughing up blood. °· You have severe back or abdominal pain, feel nauseous, or vomit. °· You develop severe weakness, fainting, or chills. °· You have a fever. °THIS IS AN EMERGENCY. Do not wait to see if the pain will go away. Get medical help at once. Call your local emergency services (911 in [place] Do not drive yourself to the hospital. °MAKE SURE YOU:  °· Understand these instructions. °· Will watch your condition. °· Will get help right away if you are not doing well or get worse. °Document Released: 01/10/2005 Document Revised: 06/25/2011 Document Reviewed: 11/06/2007 °[employer]® Patient Information ©2014 [employer]   [employer]. ° °

## 2013-08-06 NOTE — ED Provider Notes (Signed)
2000 - Patient care assumed from Dr. Shirlyn Goltz at shift change. Patient presenting for left-sided chest pain since this morning. Patient case discussed with cardiology given significant hx. Plan discussed with Dr. Darl Householder. Plan includes d/c home with outpatient cardiology f/u if serial troponin at 2300 negative. Patient hemodynamically stable since arrival in ED.  2350 - Second troponin negative. Patient states he has no symptoms at this time. He feels comfortable going home. Patient in NAD, pleasant and nontoxic/nonseptic appearing, and with even respirations. He is stable for d/c with instruction to f/u with his cardiologist. Return precautions provided and patient agreeable to plan with no unaddressed concerns.   Filed Vitals:   08/06/13 2030 08/06/13 2130 08/06/13 2257 08/06/13 2258  BP: 125/84 131/87  126/87  Pulse: 82 81  82  Temp:    98 F (36.7 C)  TempSrc:    Oral  Resp: 19 22  20   Height:      Weight:      SpO2: 99% 98% 100% 100%   Results for orders placed during the hospital encounter of 08/06/13  CBC      Result Value Ref Range   WBC 5.8  4.0 - 10.5 K/uL   RBC 5.97 (*) 4.22 - 5.81 MIL/uL   Hemoglobin 16.2  13.0 - 17.0 g/dL   HCT 47.3  39.0 - 52.0 %   MCV 79.2  78.0 - 100.0 fL   MCH 27.1  26.0 - 34.0 pg   MCHC 34.2  30.0 - 36.0 g/dL   RDW 14.2  11.5 - 15.5 %   Platelets 207  150 - 400 K/uL  BASIC METABOLIC PANEL      Result Value Ref Range   Sodium 135 (*) 137 - 147 mEq/L   Potassium 4.2  3.7 - 5.3 mEq/L   Chloride 100  96 - 112 mEq/L   CO2 18 (*) 19 - 32 mEq/L   Glucose, Bld 112 (*) 70 - 99 mg/dL   BUN 11  6 - 23 mg/dL   Creatinine, Ser 0.75  0.50 - 1.35 mg/dL   Calcium 9.8  8.4 - 10.5 mg/dL   GFR calc non Af Amer >90  >90 mL/min   GFR calc Af Amer >90  >90 mL/min  PRO B NATRIURETIC PEPTIDE      Result Value Ref Range   Pro B Natriuretic peptide (BNP) 1203.0 (*) 0 - 125 pg/mL  I-STAT TROPOININ, ED      Result Value Ref Range   Troponin i, poc 0.00  0.00 - 0.08  ng/mL   Comment 3           I-STAT TROPOININ, ED      Result Value Ref Range   Troponin i, poc 0.00  0.00 - 0.08 ng/mL   Comment 3            Dg Chest 2 View  08/06/2013   CLINICAL DATA:  Nausea.  Chest pain.  Abdominal pain.  EXAM: CHEST  2 VIEW  COMPARISON:  CT ANGIO CHEST W/CM &/OR WO/CM dated 03/18/2013; DG CHEST 1V PORT dated 03/18/2013  FINDINGS: Postoperative changes in the mediastinum. The heart size and mediastinal contours are within normal limits. Both lungs are clear. The visualized skeletal structures are unremarkable.  IMPRESSION: No active cardiopulmonary disease.   Electronically Signed   By: Lucienne Capers M.D.   On: 08/06/2013 21:12      Antonietta Breach, PA-C 08/06/13 2356

## 2013-08-06 NOTE — ED Notes (Signed)
XRAY called to inquire about CXR.

## 2013-08-06 NOTE — ED Provider Notes (Signed)
CSN: [id number]     Arrival date & time 08/06/13  1802 History   First MD Initiated Contact with Patient 08/06/13 1856     Chief Complaint  Patient presents with  . Chest Pain     (Consider location/radiation/quality/duration/timing/severity/associated sxs/prior Treatment) The history is provided by the patient.  AXTON CIHLAR is a 53 y.o. 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 States that he ate some chicken yesterday and felt nauseous today. He had one episode of vomiting and some substernal chest pain. States that it is different than previous MI. Denies shortness of breath.    Past Medical History  Diagnosis Date  . Coronary artery disease     a. s/p MI in 2009 in [place] with stenting of the LCX and LAD;  b. 12/2012 NSTEMI/CAD: LM nl, LAD patent prox stent, LCX 50-70 isr (FFR 0.84), RCA dom, 11m EF 40-45%-->Med Rx. c. 01/2013: anterolateral STEMI complicated by pericardial effusion (presumed purulent pericarditis) and tamponade s/p drain, ruptured LV pseudoaneurysm s/p CorMatrix patch, CABGx1 (SVG-OM1), fever, CVA, VDRF, C diff.  . Tobacco abuse     a. quit 12/2012.  . Cardiomyopathy     a. Sept/Oct 2014: EF ~40% (ICM). b. 03/2013: EF 25-30%. Off ACEI due to hypotension (MIXED NICM/ICM).  . Hyperlipidemia   . Pericardial effusion     a. 01/2013: pericardial effusion (presumed purulent pericarditis) and cardiac tamponade s/p drain. b. Persistent moderate pericardial effusion 03/2013.  . Cardiac tamponade     a. 01/2013 s/p drain.  . Pseudoaneurysm of left ventricle of heart     a. 01/2013:  Ruptured inferoposterior LV pseudoaneurysm s/p CorMatrix patch.  . CVA (cerebral infarction)     a. 01/2013 in setting of prolonged hospitalization - residual L arm weakness.  . C. difficile colitis     a. 01/2013 during prolonged adm. b. Recurred 03/2013.  .Marland KitchenAcute respiratory failure     a. 01/2013: VDRF. b.  03/2013: hypoxia requiring supp O2 during adm, resolved by discharge.  .Marland KitchenHyponatremia     a. During late 2014.  . 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a. 03/2013: readm with hypoxia, tachycardia, elevated ESR/CRP, elevated troponin with 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and myopericarditis; treated with steroids.  . Acute myopericarditis     a. 03/2013: readm with hypoxia, tachycardia, elevated ESR/CRP, elevated troponin with 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and myopericarditis; treated with steroids.  .Marland KitchenDysphagia     a. 03/2013: felt possibly due to globus hystericus;barium swallow normal 03/2013, recent normal esophagram 01/2013. GI did not feel any further w/u needed at that time.  . Orthostatic hypotension     a. Profound during 03/2013 admission (BP dropping to 518'Hsystolic) requiring meds scaled back.  . Physical deconditioning   . Anemia due to acute blood loss     a. 01/2013 hospitalization (s/p surgery).   Past Surgical History  Procedure Laterality Date  . Coronary angioplasty with stent placement  2000's X 2    "1 + 1" (01/06/2013)  . Cardiac catheterization  01/06/2013  . Coronary artery bypass graft N/A 01/28/2013    Procedure: CORONARY ARTERY BYPASS GRAFTING (CABG);  Surgeon: SMelrose Nakayama MD;  [facility]  Service: Open Heart Surgery;  Laterality: N/A;  CABG x one, using left greater saphenous vein harvested endoscopically  . Intraoperative transesophageal echocardiogram N/A 01/28/2013    Procedure: INTRAOPERATIVE TRANSESOPHAGEAL ECHOCARDIOGRAM;  Surgeon: SMelrose Nakayama MD;  [facility] [facility]  Service: Open Heart Surgery;  Laterality: N/A;  . Ventricular aneurysm resection N/A 01/28/2013    Procedure: LEFT VENTRICULAR ANEURYSM REPAIR;  Surgeon: Melrose Nakayama, MD;  Location: [facility];  Service: Open Heart Surgery;  Laterality: N/A;   Family History  Problem Relation Age of Onset  . CAD    . Heart attack Mother    History  Substance Use Topics  . Smoking status: Former Smoker -- 1.00 packs/day  for 35 years    Types: Cigarettes    Quit date: 01/06/2013  . Smokeless tobacco: Never Used  . Alcohol Use: No    Review of Systems  Cardiovascular: Positive for chest pain.  Gastrointestinal: Positive for vomiting.  All other systems reviewed and are negative.     Allergies  Review of patient's allergies indicates no known allergies.  Home Medications   Prior to Admission medications   Medication Sig Start Date End Date Taking? Authorizing Provider  aspirin 81 MG EC tablet Take 1 tablet (81 mg total) by mouth daily. 06/22/13  Yes Angelica Chessman, MD  carvedilol (COREG) 12.5 MG tablet Take 1 tablet (12.5 mg total) by mouth 2 (two) times daily with a meal. 08/05/13  Yes Darlin Coco, MD  clopidogrel (PLAVIX) 75 MG tablet Take 1 tablet (75 mg total) by mouth daily. 06/22/13  Yes Angelica Chessman, MD  famotidine (PEPCID) 20 MG tablet Take 1 tablet (20 mg total) by mouth 2 (two) times daily. 05/26/13  Yes Angelica Chessman, MD  lisinopril (PRINIVIL,ZESTRIL) 5 MG tablet Take 1 tablet (5 mg total) by mouth daily. 06/22/13  Yes Angelica Chessman, MD  potassium chloride SA (K-DUR,KLOR-CON) 20 MEQ tablet Take 1 tablet (20 mEq total) by mouth 3 (three) times daily. 06/22/13  Yes Angelica Chessman, MD  nitroGLYCERIN (NITROSTAT) 0.4 MG SL tablet Place 1 tablet (0.4 mg total) under the tongue every 5 (five) minutes as needed for chest pain (up to 3 doses). 04/17/13   Darlin Coco, MD   BP 121/83  Pulse 76  Temp(Src) 98.8 F (37.1 C) (Oral)  Resp 14  Ht 6' 1"  (1.854 m)  Wt 207 lb (93.895 kg)  BMI 27.32 kg/m2  SpO2 99% Physical Exam  Nursing note and vitals reviewed. Constitutional: He is oriented to person, place, and time. He appears well-developed and well-nourished.  HENT:  Head: Normocephalic.  Mouth/Throat: Oropharynx is clear and moist.  Eyes: Conjunctivae are normal. Pupils are equal, round, and reactive to light.  Neck: Normal range of motion. Neck supple.  Cardiovascular:  Normal rate, regular rhythm and normal heart sounds.   Pulmonary/Chest: Effort normal and breath sounds normal. No respiratory distress. He has no wheezes. He has no rales.  Abdominal: Soft. Bowel sounds are normal. He exhibits no distension. There is no tenderness. There is no rebound.  Musculoskeletal: Normal range of motion. He exhibits no edema and no tenderness.  Neurological: He is alert and oriented to person, place, and time. No cranial nerve deficit. Coordination normal.  Skin: Skin is warm and dry.  Psychiatric: He has a normal mood and affect. His behavior is normal. Judgment and thought content normal.    ED Course  Procedures (including critical care time)    EMERGENCY DEPARTMENT [place] CARDIAC EXAM "Study: Limited Ultrasound of the heart and pericardium"  INDICATIONS:Dyspnea Multiple views of the heart and pericardium are obtained with a multi-frequency probe.  PERFORMED IR:[id number]  IMAGES ARCHIVED?: Yes  FINDINGS: No pericardial effusion  LIMITATIONS:  Body habitus  VIEWS USED: Parasternal long axis and  Parasternal short axis  INTERPRETATION: Cardiac activity present and Pericardial effusioin absent  COMMENT:  Nl cardiac motion, no tamponade.    Labs Review Labs Reviewed  CBC - Abnormal; Notable for the following:    RBC 5.97 (*)    All other components within normal limits  BASIC METABOLIC PANEL - Abnormal; Notable for the following:    Sodium 135 (*)    CO2 18 (*)    Glucose, Bld 112 (*)    All other components within normal limits  PRO B NATRIURETIC PEPTIDE - Abnormal; Notable for the following:    Pro B Natriuretic peptide (BNP) 1203.0 (*)    All other components within normal limits  I-STAT TROPOININ, ED    Imaging Review No results found.   EKG Interpretation None      Date: 08/06/2013  Rate: 90  Rhythm: normal sinus rhythm  QRS Axis: normal  Intervals: normal  ST/T Wave abnormalities: TWI inferior and lateral leads, unchanged from  yesterday  Conduction Disutrbances:none  Narrative Interpretation:   Old EKG Reviewed: unchanged    MDM   Final diagnoses:  None    MARLYN RABINE is a 53 y.o. male here with nausea, chest pain. Atypical for ACS. No signs of tamponade. I performed bedside US that showed no pericardial effusion. I think unlikely to be MI. I called Dr. Meda Coffee, who states that given atypical presentation, ok to d/c home if 4 hr trop neg. I signed out to oncoming PA.      Wandra Arthurs, MD 08/06/13 2005

## 2013-08-06 NOTE — Telephone Encounter (Signed)
Patient has appointment tomorrow 4/24 at 2:00, patient aware

## 2013-08-06 NOTE — ED Notes (Signed)
Repeat troponin at 2300.

## 2013-08-07 ENCOUNTER — Ambulatory Visit: Payer: Medicaid Other | Admitting: Rehabilitative and Restorative Service Providers"

## 2013-08-07 NOTE — ED Provider Notes (Signed)
Medical screening examination/treatment/procedure(s) were performed by non-physician practitioner and as supervising physician I was immediately available for consultation/collaboration.   EKG Interpretation None        Osvaldo Shipper, MD 08/07/13 [phone]

## 2013-08-14 ENCOUNTER — Encounter: Payer: Medicaid Other | Admitting: Occupational Therapy

## 2013-08-14 ENCOUNTER — Ambulatory Visit: Payer: Medicaid Other | Attending: Cardiology | Admitting: Occupational Therapy

## 2013-08-14 DIAGNOSIS — I252 Old myocardial infarction: Secondary | ICD-10-CM | POA: Insufficient documentation

## 2013-08-14 DIAGNOSIS — R279 Unspecified lack of coordination: Secondary | ICD-10-CM | POA: Insufficient documentation

## 2013-08-14 DIAGNOSIS — IMO0001 Reserved for inherently not codable concepts without codable children: Secondary | ICD-10-CM | POA: Insufficient documentation

## 2013-08-14 DIAGNOSIS — M6281 Muscle weakness (generalized): Secondary | ICD-10-CM | POA: Insufficient documentation

## 2013-08-14 DIAGNOSIS — M25519 Pain in unspecified shoulder: Secondary | ICD-10-CM | POA: Insufficient documentation

## 2013-08-14 DIAGNOSIS — I69998 Other sequelae following unspecified cerebrovascular disease: Secondary | ICD-10-CM | POA: Insufficient documentation

## 2013-08-14 DIAGNOSIS — Z951 Presence of aortocoronary bypass graft: Secondary | ICD-10-CM | POA: Insufficient documentation

## 2013-08-18 ENCOUNTER — Ambulatory Visit: Payer: Medicaid Other | Admitting: Occupational Therapy

## 2013-08-18 DIAGNOSIS — Z951 Presence of aortocoronary bypass graft: Secondary | ICD-10-CM | POA: Diagnosis not present

## 2013-08-18 DIAGNOSIS — IMO0001 Reserved for inherently not codable concepts without codable children: Secondary | ICD-10-CM | POA: Diagnosis present

## 2013-08-18 DIAGNOSIS — M25519 Pain in unspecified shoulder: Secondary | ICD-10-CM | POA: Diagnosis not present

## 2013-08-18 DIAGNOSIS — I252 Old myocardial infarction: Secondary | ICD-10-CM | POA: Diagnosis not present

## 2013-08-18 DIAGNOSIS — R279 Unspecified lack of coordination: Secondary | ICD-10-CM | POA: Diagnosis not present

## 2013-08-18 DIAGNOSIS — M6281 Muscle weakness (generalized): Secondary | ICD-10-CM | POA: Diagnosis not present

## 2013-08-18 DIAGNOSIS — I69998 Other sequelae following unspecified cerebrovascular disease: Secondary | ICD-10-CM | POA: Diagnosis not present

## 2013-09-17 ENCOUNTER — Ambulatory Visit: Payer: Medicaid Other | Attending: Internal Medicine | Admitting: Occupational Therapy

## 2013-09-17 DIAGNOSIS — IMO0001 Reserved for inherently not codable concepts without codable children: Secondary | ICD-10-CM | POA: Diagnosis present

## 2013-09-21 ENCOUNTER — Ambulatory Visit: Payer: Medicaid Other | Admitting: Occupational Therapy

## 2013-09-21 DIAGNOSIS — IMO0001 Reserved for inherently not codable concepts without codable children: Secondary | ICD-10-CM | POA: Diagnosis not present

## 2013-09-22 ENCOUNTER — Ambulatory Visit: Payer: Self-pay | Admitting: Internal Medicine

## 2013-09-23 ENCOUNTER — Other Ambulatory Visit (HOSPITAL_COMMUNITY): Payer: Medicaid Other

## 2013-10-01 ENCOUNTER — Ambulatory Visit: Payer: Medicaid Other | Admitting: Cardiology

## 2013-10-01 ENCOUNTER — Other Ambulatory Visit (HOSPITAL_COMMUNITY): Payer: Medicaid Other

## 2013-10-08 ENCOUNTER — Ambulatory Visit: Payer: Medicaid Other | Admitting: Occupational Therapy

## 2013-10-08 DIAGNOSIS — IMO0001 Reserved for inherently not codable concepts without codable children: Secondary | ICD-10-CM | POA: Diagnosis not present

## 2013-10-23 ENCOUNTER — Ambulatory Visit: Payer: Self-pay | Admitting: Nurse Practitioner

## 2013-11-02 ENCOUNTER — Ambulatory Visit (INDEPENDENT_AMBULATORY_CARE_PROVIDER_SITE_OTHER): Payer: Medicaid Other | Admitting: Nurse Practitioner

## 2013-11-02 ENCOUNTER — Other Ambulatory Visit: Payer: Self-pay | Admitting: Internal Medicine

## 2013-11-02 ENCOUNTER — Encounter: Payer: Self-pay | Admitting: Nurse Practitioner

## 2013-11-02 ENCOUNTER — Encounter (INDEPENDENT_AMBULATORY_CARE_PROVIDER_SITE_OTHER): Payer: Self-pay

## 2013-11-02 VITALS — BP 102/68 | HR 75 | Temp 98.5°F | Ht 73.0 in | Wt 221.0 lb

## 2013-11-02 DIAGNOSIS — Z8673 Personal history of transient ischemic attack (TIA), and cerebral infarction without residual deficits: Secondary | ICD-10-CM | POA: Insufficient documentation

## 2013-11-02 DIAGNOSIS — M6281 Muscle weakness (generalized): Secondary | ICD-10-CM

## 2013-11-02 DIAGNOSIS — R29898 Other symptoms and signs involving the musculoskeletal system: Secondary | ICD-10-CM

## 2013-11-02 NOTE — Progress Notes (Signed)
PATIENT: [name] DOB: 01-26-1961  REASON FOR VISIT: routine follow up for stroke HISTORY FROM: patient  HISTORY OF PRESENT ILLNESS: Update 11/02/13 (LL): [name] in for stroke followup.  Since last visit, he was prescribed new eyeglasses and now states he sees well.  Blood pressure is well controlled, it is 102/68 in the office today. Last hgb a1c was 5.4, total cholesterol 133 and LDL 72. 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  He is tolerating Plavix and aspirin well with no signs of significant bleeding or bruising.  He has started back smoking cigarettes again.  48 year African American male seen for followup hospital consultation for a stroke in October 2014. He was in his usual state of health until September 2014 when he presented with chest pain and was cathetered and showed a totally occluded right Coronary and a 50% in-stent restenosis of the circumflex stent. He had had previous stents to the LAD and circumflex in [place] in 2007. The patient was readmitted with recurrent anterolateral ST elevation chest pain in October 2014 and was found to have a large pericardial effusion with tamponade. A pericardial drain was placed. He was almost ready for discharge when he developed signs of a stroke with left arm weakness and left visual field defect. He then developed hemodynamic collapse with hypotension and tachycardia and fever of 104 and required intubation. Repeat echo showed complex pericardial effusion with right ventricular collapse and tamponade and he responded to volume resuscitation and went on to have emergent median sternotomy, repair of ruptured inferoposterior left ventricular pseudoaneurysm with a core matrix patch and CABG x1 with saphenous vein graft to the obtuse marginal 1 ALT by Dr. Roxan Hockey. He was readmitted to the hospital in late 2014  with persistent elevation of his troponin levels and felt to have 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with myopericarditis. Echo showed moderate pericardial effusion. He also had recurrent C. difficile colitis. His ejection fraction by echo was 25% and a sedimentation rate was elevated. CT scan showed an old cerebral infarct with subsequent scan this showed multiple infarcts in different vascular distributions in both hemispheres involving left cerebellum, posterior and medial left parietal lobe, lateral left occipital lobe, left high frontal lobe, right posterior frontal lobe and corona radiata. CT angio neck showed no significant extracranial stenosis. CT angiogram showed no large vessel stenosis. His strokes were thought to be cardioembolic related to his cardiac surgery as well as from thromboembolism from necrotic myocardium and intramural clot. HbA1c was 5.7. Lipid profile showed total cholesterol 164, triglycerides 109, HDL 44 and LDL 98 mg percent.  Patient has done well since discharge and is living at home. He still has mild diminished fine motor skills and weakness his left hand. He feels left-sided vision is improved but he now has trouble seeing from his right eye. He seems to be new finding and is not yet seen an ophthalmologist to have this evaluated yet. 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 He is currently on disability.  ROS:  14 system review of systems is positive for nothing, no complaints today.  ALLERGIES: No Known Allergies  HOME MEDICATIONS: Outpatient Prescriptions Prior to Visit  Medication Sig Dispense Refill  . aspirin 81 MG EC tablet Take 1 tablet (81 mg total) by mouth daily.  [place]  tablet  3  . carvedilol (COREG) 12.5 MG tablet Take 1 tablet (12.5 mg total) by mouth 2 (two) times daily with a meal.  60 tablet  5  . clopidogrel (PLAVIX) 75 MG tablet Take 1 tablet (75 mg total) by mouth daily.  90 tablet  3  . famotidine (PEPCID) 20 MG tablet  Take 1 tablet (20 mg total) by mouth 2 (two) times daily.  30 tablet  0  . lisinopril (PRINIVIL,ZESTRIL) 5 MG tablet Take 1 tablet (5 mg total) by mouth daily.  90 tablet  3  . nitroGLYCERIN (NITROSTAT) 0.4 MG SL tablet Place 1 tablet (0.4 mg total) under the tongue every 5 (five) minutes as needed for chest pain (up to 3 doses).  25 tablet  0  . ondansetron (ZOFRAN) 4 MG tablet Take 1 tablet (4 mg total) by mouth every 6 (six) hours.  12 tablet  0  . potassium chloride SA (K-DUR,KLOR-CON) 20 MEQ tablet Take 1 tablet (20 mEq total) by mouth 3 (three) times daily.  90 tablet  3   No facility-administered medications prior to visit.    PHYSICAL EXAM Filed Vitals:   11/02/13 1019  BP: 102/68  Pulse: 75  Temp: 98.5 F (36.9 C)  TempSrc: Oral  Height: 6\' 1"  (1.854 m)  Weight: 221 lb (100.245 kg)   Body mass index is 29.16 kg/(m^2). No exam data present No flowsheet data found.  No flowsheet data found.  Physical Exam  General: well developed, well nourished african [place] male, seated, in no evident distress  Head: head normocephalic and atraumatic. Orohparynx benign  Neck: supple with no carotid or supraclavicular bruits  Cardiovascular: regular rate and rhythm, no murmurs  Musculoskeletal: no deformity  Skin: no rash/petichiae  Vascular: Normal pulses all extremities   Neurologic Exam  Mental Status: Awake and fully alert. Oriented to place and time. Recent and remote memory intact. Attention span, concentration and fund of knowledge appropriate. Mood and affect appropriate.  Cranial Nerves:  Pupils equal, reactive to light. Extraocular movements full without nystagmus. Visual fields full to confrontation.  Fundi not visualized. Hearing intact. Facial sensation intact. Face, tongue, palate moves normally and symmetrically.  Motor: Normal bulk and tone. Diminished left grip strength and fine finger movements. Orbits right over left upper extremity.   Sensory: intact to touch and  pinprick and vibratory sensation.  Coordination: Rapid alternating movements abnormal in left extremities. Finger-to-nose and heel-to-shin performed slowly on left  Gait and Station: Arises from chair without difficulty. Stance is normal. Gait demonstrates normal stride length and balance . Able to heel, toe and tandem walk without difficulty.  Reflexes: 1+ and asymmetric brisker on left. Toes downgoing.  NIHSS 0  Modified Rankin 1   ASSESSMENT: 49 year African [place] male multiple infarcts involving left superior cerebellum, posterior and medial left parietal lobe and left lateral occipital as well as left frontal lobe and right frontal lobe and corona radiata and October 4196 cardioembolic etiology following pericardial tamponade requiring cardiac surgery for removal of necrotic myocardium and repair of inferoposterior pseudoaneurysm. He has done quite well with only mild residual left hand weakness.  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   PLAN:  I had a long discussion the patient regarding his stroke and hospitalization as well as answered questions about his evaluation and secondary stroke prevention. Continue aspirin and Plavix for stroke prevention given his significant cardiac  disease and maintain strict control of hypertension with blood pressure goal below 130/90 and lipids with LDL cholesterol goal below 100 mg percent. Return for followup in 6 months with Charlott Holler, NP or call earlier if necessary.  Rudi Rummage LAM, MSN, FNP-BC, A/GNP-C 11/02/2013, 10:21 AM [facility] [address], [place], [place] [address] [phone]  Note: This document was prepared with digital dictation and possible smart phrase technology. Any transcriptional errors that result from this process are unintentional.

## 2013-11-02 NOTE — Patient Instructions (Signed)
Continue aspirin and Plavix for stroke prevention given his significant cardiac disease and maintain strict control of hypertension with blood pressure goal below 130/90 and lipids with LDL cholesterol goal below 100 mg percent. Return for followup in 6 months with Charlott Holler, NP or call earlier if necessary.  Stroke Prevention Some medical conditions and behaviors are associated with an increased chance of having a stroke. You may prevent a stroke by making healthy choices and managing medical conditions. HOW CAN I REDUCE MY RISK OF HAVING A STROKE?   Stay physically active. Get at least 30 minutes of activity on most or all days.  Do not smoke. It may also be helpful to avoid exposure to secondhand smoke.  Limit alcohol use. Moderate alcohol use is considered to be:  No more than 2 drinks per day for men.  No more than 1 drink per day for nonpregnant women.  Eat healthy foods. This involves  Eating 5 or more servings of fruits and vegetables a day.  Following a diet that addresses high blood pressure (hypertension), high cholesterol, diabetes, or obesity.  Manage your cholesterol levels.  A diet low in saturated fat, trans fat, and cholesterol and high in fiber may control cholesterol levels.  Take any prescribed medicines to control cholesterol as directed by your health care provider.  Manage your diabetes.  A controlled-carbohydrate, controlled-sugar diet is recommended to manage diabetes.  Take any prescribed medicines to control diabetes as directed by your health care provider.  Control your hypertension.  A low-salt (sodium), low-saturated fat, low-trans fat, and low-cholesterol diet is recommended to manage hypertension.  Take any prescribed medicines to control hypertension as directed by your health care provider.  Maintain a healthy weight.  A reduced-calorie, low-sodium, low-saturated fat, low-trans fat, low-cholesterol diet is recommended to manage  weight.  Stop drug abuse.  Avoid taking birth control pills.  Talk to your health care provider about the risks of taking birth control pills if you are over 2 years old, smoke, get migraines, or have ever had a blood clot.  Get evaluated for sleep disorders (sleep apnea).  Talk to your health care provider about getting a sleep evaluation if you snore a lot or have excessive sleepiness.  Take medicines as directed by your health care provider.  For some people, aspirin or blood thinners (anticoagulants) are helpful in reducing the risk of forming abnormal blood clots that can lead to stroke. If you have the irregular heart rhythm of atrial fibrillation, you should be on a blood thinner unless there is a good reason you cannot take them.  Understand all your medicine instructions.  Make sure that other other conditions (such as anemia or atherosclerosis) are addressed. SEEK IMMEDIATE MEDICAL CARE IF:   You have sudden weakness or numbness of the face, arm, or leg, especially on one side of the body.  Your face or eyelid droops to one side.  You have sudden confusion.  You have trouble speaking (aphasia) or understanding.  You have sudden trouble seeing in one or both eyes.  You have sudden trouble walking.  You have dizziness.  You have a loss of balance or coordination.  You have a sudden, severe headache with no known cause.  You have new chest pain or an irregular heartbeat. Any of these symptoms may represent a serious problem that is an emergency. Do not wait to see if the symptoms will go away. Get medical help at once. Call your local emergency services  (911 in  [place] Do not drive yourself to the hospital. Document Released: 05/10/2004 Document Revised: 01/21/2013 Document Reviewed: 10/03/2012 [facility] Patient Information 2015 [place], [place]. This information is not intended to replace advice given to you by your health care provider. Make sure you discuss any  questions you have with your health care provider.

## 2013-11-02 NOTE — Progress Notes (Signed)
I agree with above 

## 2013-11-26 ENCOUNTER — Ambulatory Visit (HOSPITAL_COMMUNITY): Payer: Medicaid Other | Attending: Cardiology | Admitting: Radiology

## 2013-11-26 ENCOUNTER — Encounter: Payer: Self-pay | Admitting: Cardiology

## 2013-11-26 ENCOUNTER — Ambulatory Visit (INDEPENDENT_AMBULATORY_CARE_PROVIDER_SITE_OTHER): Payer: Medicaid Other | Admitting: Cardiology

## 2013-11-26 ENCOUNTER — Telehealth: Payer: Self-pay | Admitting: Internal Medicine

## 2013-11-26 VITALS — BP 115/99 | HR 65 | Ht 73.0 in | Wt 227.0 lb

## 2013-11-26 DIAGNOSIS — I639 Cerebral infarction, unspecified: Secondary | ICD-10-CM

## 2013-11-26 DIAGNOSIS — I2510 Atherosclerotic heart disease of native coronary artery without angina pectoris: Secondary | ICD-10-CM

## 2013-11-26 DIAGNOSIS — I314 Cardiac tamponade: Secondary | ICD-10-CM | POA: Diagnosis not present

## 2013-11-26 DIAGNOSIS — I241 Dressler's syndrome: Secondary | ICD-10-CM | POA: Insufficient documentation

## 2013-11-26 DIAGNOSIS — I428 Other cardiomyopathies: Secondary | ICD-10-CM

## 2013-11-26 DIAGNOSIS — I214 Non-ST elevation (NSTEMI) myocardial infarction: Secondary | ICD-10-CM

## 2013-11-26 DIAGNOSIS — I2589 Other forms of chronic ischemic heart disease: Secondary | ICD-10-CM | POA: Diagnosis present

## 2013-11-26 DIAGNOSIS — I319 Disease of pericardium, unspecified: Secondary | ICD-10-CM | POA: Diagnosis not present

## 2013-11-26 DIAGNOSIS — I517 Cardiomegaly: Secondary | ICD-10-CM | POA: Insufficient documentation

## 2013-11-26 DIAGNOSIS — I079 Rheumatic tricuspid valve disease, unspecified: Secondary | ICD-10-CM | POA: Diagnosis not present

## 2013-11-26 DIAGNOSIS — I429 Cardiomyopathy, unspecified: Secondary | ICD-10-CM

## 2013-11-26 DIAGNOSIS — I259 Chronic ischemic heart disease, unspecified: Secondary | ICD-10-CM

## 2013-11-26 DIAGNOSIS — R9430 Abnormal result of cardiovascular function study, unspecified: Secondary | ICD-10-CM

## 2013-11-26 DIAGNOSIS — I252 Old myocardial infarction: Secondary | ICD-10-CM | POA: Insufficient documentation

## 2013-11-26 DIAGNOSIS — I309 Acute pericarditis, unspecified: Secondary | ICD-10-CM

## 2013-11-26 DIAGNOSIS — I059 Rheumatic mitral valve disease, unspecified: Secondary | ICD-10-CM | POA: Diagnosis not present

## 2013-11-26 DIAGNOSIS — I6350 Cerebral infarction due to unspecified occlusion or stenosis of unspecified cerebral artery: Secondary | ICD-10-CM

## 2013-11-26 NOTE — Assessment & Plan Note (Signed)
The patient is not having any recurrent angina pectoris

## 2013-11-26 NOTE — Progress Notes (Signed)
Echocardiogram performed.  

## 2013-11-26 NOTE — Assessment & Plan Note (Signed)
Repeat echo pending today.  He is not having any symptoms of CHF.

## 2013-11-26 NOTE — Assessment & Plan Note (Signed)
No systemic symptoms to suggest recurrent myopericarditis.

## 2013-11-26 NOTE — Progress Notes (Signed)
[name] Date of Birth:  [birth year] [facility] [address] [place], [place]  [address] [phone]        Fax   [phone]   History of Present Illness: This 53 year old African American male is seen back for a scheduled followup office visit. He currently is feeling well. He was in his usual state of health until September 2014 when he presented with chest pain and was cathetered and showed a totally occluded right and a 50% in-stent restenosis of the circumflex stent. He had had previous stents to the LAD and circumflex in [place] in 2007. The patient was readmitted with recurrent anterolateral ST elevation chest pain in October 2014 and was found to have a large pericardial effusion with tamponade.. A pericardial drain was placed. He was almost ready for discharge when he developed signs of a stroke with left arm weakness and left visual field defect. He then developed hemodynamic collapse with hypotension and tachycardia and fever of 104 and required intubation. 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 Roxan Hockey.  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 Echo showed moderate pericardial effusion. He also had recurrent C. difficile colitis. His ejection fraction by echo was 25% and a sedimentation rate was elevated. He was discharged to skilled nursing facility and was sent home on week ago. He is significantly improved clinically.  He is now living at home.  He is walking a mile to a mile and a half a day. He is not having any chest pain. He denies any  dizzy spells or syncope. He still has residual weakness of the left arm but no other residual weakness. His blood pressure has improved.  The patient had an echocardiogram today results pending  Current Outpatient Prescriptions  Medication Sig Dispense Refill  . aspirin 81 MG EC tablet Take 1 tablet (81 mg total) by mouth daily.  90 tablet  3  . carvedilol (COREG) 12.5 MG tablet Take 1 tablet (12.5 mg total) by mouth 2 (two) times daily with a meal.  60 tablet  5  . clopidogrel (PLAVIX) 75 MG tablet Take 1 tablet (75 mg total) by mouth daily.  90 tablet  3  . famotidine (PEPCID) 20 MG tablet Take 1 tablet (20 mg total) by mouth 2 (two) times daily.  30 tablet  0  . lisinopril (PRINIVIL,ZESTRIL) 5 MG tablet Take 1 tablet (5 mg total) by mouth daily.  90 tablet  3  . nitroGLYCERIN (NITROSTAT) 0.4 MG SL tablet Place 1 tablet (0.4 mg total) under the tongue every 5 (five) minutes as needed for chest pain (up to 3 doses).  25 tablet  0  . ondansetron (ZOFRAN) 4 MG tablet Take 1 tablet (4 mg total) by mouth every 6 (six) hours.  12 tablet  0   No current facility-administered medications for this visit.    No Known Allergies  Patient Active Problem List   Diagnosis Date Noted  . History of cardioembolic stroke 71/24/5809  . Vision loss of right eye 06/23/2013  . Other primary cardiomyopathies 06/22/2013  . ST elevation myocardial infarction (STEMI), subsequent to initial  episode of care 06/22/2013  . Dyslipidemia 06/22/2013  . Smoking addiction 06/22/2013  . Physical deconditioning 03/25/2013  . Acute respiratory failure   . 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. Acute myopericarditis   . Pericardial effusion   . Coronary artery disease   . Cardiomyopathy   . Dysphagia   . C. difficile colitis   . Orthostatic hypotension   . Hyponatremia   . CVA (cerebral infarction)   . Anemia due to acute blood loss   . Pseudoaneurysm of left ventricle of heart   . Cardiac tamponade   . Tobacco abuse   .  Leucocytosis 02/05/2013  . Acute blood loss anemia 02/05/2013  . Hypokalemia 01/07/2013  . Non-STEMI (non-ST elevated myocardial infarction) 01/05/2013    History  Smoking status  . Former Smoker -- 1.00 packs/day for 35 years  . Types: Cigarettes  . Quit date: 01/06/2013  Smokeless tobacco  . Never Used    History  Alcohol Use No    Family History  Problem Relation Age of Onset  . CAD    . Heart attack Mother     Review of Systems: Constitutional: no fever chills diaphoresis or fatigue or change in weight.  Head and neck: no hearing loss, no epistaxis, no photophobia or visual disturbance. Respiratory: No cough, shortness of breath or wheezing. Cardiovascular: No chest pain peripheral edema, palpitations. Gastrointestinal: No abdominal distention, no abdominal pain, no change in bowel habits hematochezia or melena. Genitourinary: No dysuria, no frequency, no urgency, no nocturia. Musculoskeletal:No arthralgias, no back pain, no gait disturbance or myalgias. Neurological: No dizziness, no headaches, no numbness, no seizures, no syncope, no weakness, no tremors. Hematologic: No lymphadenopathy, no easy bruising. Psychiatric: No confusion, no hallucinations, no sleep disturbance.    Physical Exam: Filed Vitals:   11/26/13 1612  BP: 115/99  Pulse: 65   the general appearance reveals a well-developed well-nourished gentleman in no distress.The head and neck exam reveals pupils equal and reactive.  Extraocular movements are full.  There is no scleral icterus.  The mouth and pharynx are normal.  The neck is supple.  The carotids reveal no bruits.  The jugular venous pressure is normal.  The  thyroid is not enlarged.  There is no lymphadenopathy.  The chest is clear to percussion and auscultation.  There are no rales or rhonchi.  Expansion of the chest is symmetrical.  The precordium is quiet.  The first heart sound is normal.  The second heart sound is physiologically split.   There is no murmur gallop rub or click.  There is no abnormal lift or heave.  The abdomen is soft and nontender.  The bowel sounds are normal.  The liver and spleen are not enlarged.  There are no abdominal masses.  There are no abdominal bruits.  Extremities reveal good pedal pulses.  There is no phlebitis or edema.  There is no cyanosis or clubbing.  Strength reveals residual weakness and decreased range of motion of the left arm following his old stroke.   There are no sensory deficits.  The skin is warm and dry.  There is no rash.     Assessment / Plan: 1.  Ischemic heart disease status post CABG in October 2014 4 repair of ruptured inferoposterior left ventricular pseudoaneurysm into the pericardial sac with resultant pericardial tamponade and shock. 2. past history of Dressler's myopericarditis postoperative 3. history of preoperative stroke with left arm weakness and left visual field defect 4. essential hypertension  Disposition: Continue full  activity.  Continue same medication.  Recheck in 4 months for office visit and EKG.

## 2013-11-26 NOTE — Patient Instructions (Signed)
Your physician recommends that you continue on your current medications as directed. Please refer to the Current Medication list given to you today.  Your physician recommends that you schedule a follow-up appointment in: 4 month ov/ekg 

## 2013-11-26 NOTE — Telephone Encounter (Signed)
Pt needs refill for potasium pills if he still should take them Please f/u with Patient

## 2013-11-27 ENCOUNTER — Telehealth: Payer: Self-pay | Admitting: *Deleted

## 2013-11-27 DIAGNOSIS — I428 Other cardiomyopathies: Secondary | ICD-10-CM

## 2013-11-27 MED ORDER — LISINOPRIL 10 MG PO TABS: 10.0000 mg | ORAL_TABLET | Freq: Every day | ORAL | Status: DC

## 2013-11-27 MED ORDER — CARVEDILOL 25 MG PO TABS: 25.0000 mg | ORAL_TABLET | Freq: Two times a day (BID) | ORAL | Status: DC

## 2013-11-27 NOTE — Telephone Encounter (Signed)
Advised patient of results and changes in medications, verbalized understanding.

## 2013-11-27 NOTE — Telephone Encounter (Signed)
Message copied by Earvin Hansen on Fri Nov 27, 2013  4:58 PM ------      Message from: Darlin Coco      Created: Fri Nov 27, 2013  7:51 AM       Please report.  The echo shows slight improvement in left ventricular function.  Ejection fraction is approximately 30%.  I would like him to increase his carvedilol up to 25 mg twice a day and increase his lisinopril to 10 mg daily. ------

## 2013-12-15 ENCOUNTER — Telehealth: Payer: Self-pay | Admitting: Cardiology

## 2013-12-15 NOTE — Telephone Encounter (Signed)
New message          Pt needs a letter that releases him to go back to work / he also would like for [place] to give him a call

## 2013-12-15 NOTE — Telephone Encounter (Signed)
Left message to call back  

## 2013-12-16 NOTE — Telephone Encounter (Signed)
Spoke with patient and he stated had physical he no longer needed anything

## 2014-02-04 ENCOUNTER — Telehealth: Payer: Self-pay | Admitting: Internal Medicine

## 2014-02-04 NOTE — Telephone Encounter (Signed)
Pt. Called stating that he would like to get a refill for his Potassium. Please f/u with pt.

## 2014-02-05 NOTE — Telephone Encounter (Signed)
Pt has not been seen in clinic since 06/2013 Medication not listed on [facility]

## 2014-02-15 ENCOUNTER — Telehealth: Payer: Self-pay | Admitting: Internal Medicine

## 2014-02-15 NOTE — Telephone Encounter (Signed)
Patient calling to request a medication refill for lisinopril (PRINIVIL,ZESTRIL) 10 MG tablet.  Patient is also requesting refill for simdastatin 40 mg. Patient informed that this medication is not showing on his med list and may have been filled by a provider outside of office. Patient has not been seen in office since April. Patient has a scheduled appt for next Monday, Nov. 9th. Patient states he is completely out of meds and is needing medications until his scheduled appt. Please assist.

## 2014-02-16 ENCOUNTER — Other Ambulatory Visit: Payer: Self-pay | Admitting: Emergency Medicine

## 2014-02-16 ENCOUNTER — Telehealth: Payer: Self-pay | Admitting: Emergency Medicine

## 2014-02-16 DIAGNOSIS — I10 Essential (primary) hypertension: Secondary | ICD-10-CM

## 2014-02-16 MED ORDER — LISINOPRIL 10 MG PO TABS: 10.0000 mg | ORAL_TABLET | Freq: Every day | ORAL | Status: DC

## 2014-02-16 NOTE — Telephone Encounter (Signed)
Left message that medication Lisinopril reordered until seen by the provider He will need to discuss cholesterol medication refill during visit; not on [facility]

## 2014-02-22 ENCOUNTER — Encounter: Payer: Self-pay | Admitting: Internal Medicine

## 2014-02-22 ENCOUNTER — Ambulatory Visit: Payer: Managed Care, Other (non HMO) | Attending: Internal Medicine | Admitting: Internal Medicine

## 2014-02-22 VITALS — BP 110/70 | HR 60 | Temp 98.5°F | Resp 18 | Ht 73.0 in | Wt 229.0 lb

## 2014-02-22 DIAGNOSIS — E785 Hyperlipidemia, unspecified: Secondary | ICD-10-CM | POA: Diagnosis not present

## 2014-02-22 DIAGNOSIS — I2510 Atherosclerotic heart disease of native coronary artery without angina pectoris: Secondary | ICD-10-CM | POA: Insufficient documentation

## 2014-02-22 DIAGNOSIS — I252 Old myocardial infarction: Secondary | ICD-10-CM | POA: Insufficient documentation

## 2014-02-22 DIAGNOSIS — Z1211 Encounter for screening for malignant neoplasm of colon: Secondary | ICD-10-CM | POA: Diagnosis not present

## 2014-02-22 DIAGNOSIS — I69354 Hemiplegia and hemiparesis following cerebral infarction affecting left non-dominant side: Secondary | ICD-10-CM | POA: Diagnosis not present

## 2014-02-22 DIAGNOSIS — Z951 Presence of aortocoronary bypass graft: Secondary | ICD-10-CM | POA: Insufficient documentation

## 2014-02-22 DIAGNOSIS — I429 Cardiomyopathy, unspecified: Secondary | ICD-10-CM | POA: Insufficient documentation

## 2014-02-22 DIAGNOSIS — Z955 Presence of coronary angioplasty implant and graft: Secondary | ICD-10-CM | POA: Insufficient documentation

## 2014-02-22 DIAGNOSIS — F17200 Nicotine dependence, unspecified, uncomplicated: Secondary | ICD-10-CM | POA: Diagnosis not present

## 2014-02-22 DIAGNOSIS — E876 Hypokalemia: Secondary | ICD-10-CM | POA: Insufficient documentation

## 2014-02-22 MED ORDER — SIMVASTATIN 40 MG PO TABS: 40.0000 mg | ORAL_TABLET | Freq: Every day | ORAL | Status: DC

## 2014-02-22 NOTE — Patient Instructions (Signed)
DASH Eating Plan DASH stands for "Dietary Approaches to Stop Hypertension." The DASH eating plan is a healthy eating plan that has been shown to reduce high blood pressure (hypertension). Additional health benefits may include reducing the risk of type 2 diabetes mellitus, heart disease, and stroke. The DASH eating plan may also help with weight loss. WHAT DO I NEED TO KNOW ABOUT THE DASH EATING PLAN? For the DASH eating plan, you will follow these general guidelines:  Choose foods with a percent daily value for sodium of less than 5% (as listed on the food label).  Use salt-free seasonings or herbs instead of table salt or sea salt.  Check with your health care provider or pharmacist before using salt substitutes.  Eat lower-sodium products, often labeled as "lower sodium" or "no salt added."  Eat fresh foods.  Eat more vegetables, fruits, and low-fat dairy products.  Choose whole grains. Look for the word "whole" as the first word in the ingredient list.  Choose fish and skinless chicken or turkey more often than red meat. Limit fish, poultry, and meat to 6 oz (170 g) each day.  Limit sweets, desserts, sugars, and sugary drinks.  Choose heart-healthy fats.  Limit cheese to 1 oz (28 g) per day.  Eat more home-cooked food and less restaurant, buffet, and fast food.  Limit fried foods.  Cook foods using methods other than frying.  Limit canned vegetables. If you do use them, rinse them well to decrease the sodium.  When eating at a restaurant, ask that your food be prepared with less salt, or no salt if possible. WHAT FOODS CAN I EAT? Seek help from a dietitian for individual calorie needs. Grains Whole grain or whole wheat bread. Brown rice. Whole grain or whole wheat pasta. Quinoa, bulgur, and whole grain cereals. Low-sodium cereals. Corn or whole wheat flour tortillas. Whole grain cornbread. Whole grain crackers. Low-sodium crackers. Vegetables Fresh or frozen vegetables  (raw, steamed, roasted, or grilled). Low-sodium or reduced-sodium tomato and vegetable juices. Low-sodium or reduced-sodium tomato sauce and paste. Low-sodium or reduced-sodium canned vegetables.  Fruits All fresh, canned (in natural juice), or frozen fruits. Meat and Other Protein Products Ground beef (85% or leaner), grass-fed beef, or beef trimmed of fat. Skinless chicken or turkey. Ground chicken or turkey. Pork trimmed of fat. All fish and seafood. Eggs. Dried beans, peas, or lentils. Unsalted nuts and seeds. Unsalted canned beans. Dairy Low-fat dairy products, such as skim or 1% milk, 2% or reduced-fat cheeses, low-fat ricotta or cottage cheese, or plain low-fat yogurt. Low-sodium or reduced-sodium cheeses. Fats and Oils Tub margarines without trans fats. Light or reduced-fat mayonnaise and salad dressings (reduced sodium). Avocado. Safflower, olive, or canola oils. Natural peanut or almond butter. Other Unsalted popcorn and pretzels. The items listed above may not be a complete list of recommended foods or beverages. Contact your dietitian for more options. WHAT FOODS ARE NOT RECOMMENDED? Grains White bread. White pasta. White rice. Refined cornbread. Bagels and croissants. Crackers that contain trans fat. Vegetables Creamed or fried vegetables. Vegetables in a cheese sauce. Regular canned vegetables. Regular canned tomato sauce and paste. Regular tomato and vegetable juices. Fruits Dried fruits. Canned fruit in light or heavy syrup. Fruit juice. Meat and Other Protein Products Fatty cuts of meat. Ribs, chicken wings, bacon, sausage, bologna, salami, chitterlings, fatback, hot dogs, bratwurst, and packaged luncheon meats. Salted nuts and seeds. Canned beans with salt. Dairy Whole or 2% milk, cream, half-and-half, and cream cheese. Whole-fat or sweetened yogurt. Full-fat   cheeses or blue cheese. Nondairy creamers and whipped toppings. Processed cheese, cheese spreads, or cheese  curds. Condiments Onion and garlic salt, seasoned salt, table salt, and sea salt. Canned and packaged gravies. Worcestershire sauce. Tartar sauce. Barbecue sauce. Teriyaki sauce. Soy sauce, including reduced sodium. Steak sauce. Fish sauce. Oyster sauce. Cocktail sauce. Horseradish. Ketchup and mustard. Meat flavorings and tenderizers. Bouillon cubes. Hot sauce. Tabasco sauce. Marinades. Taco seasonings. Relishes. Fats and Oils Butter, stick margarine, lard, shortening, ghee, and bacon fat. Coconut, palm kernel, or palm oils. Regular salad dressings. Other Pickles and olives. Salted popcorn and pretzels. The items listed above may not be a complete list of foods and beverages to avoid. Contact your dietitian for more information. WHERE CAN I FIND MORE INFORMATION? National Heart, Lung, and Blood Institute: www.nhlbi.nih.gov/health/health-topics/topics/dash/ Document Released: 03/22/2011 Document Revised: 08/17/2013 Document Reviewed: 02/04/2013 ExitCare Patient Information 2015 [employer]. This information is not intended to replace advice given to you by your health care provider. Make sure you discuss any questions you have with your health care provider. Hypertension Hypertension, commonly called high blood pressure, is when the force of blood pumping through your arteries is too strong. Your arteries are the blood vessels that carry blood from your heart throughout your body. A blood pressure reading consists of a higher number over a lower number, such as 110/72. The higher number (systolic) is the pressure inside your arteries when your heart pumps. The lower number (diastolic) is the pressure inside your arteries when your heart relaxes. Ideally you want your blood pressure below 120/80. Hypertension forces your heart to work harder to pump blood. Your arteries may become narrow or stiff. Having hypertension puts you at risk for heart disease, stroke, and other problems.  RISK  FACTORS Some risk factors for high blood pressure are controllable. Others are not.  Risk factors you cannot control include:   Race. You may be at higher risk if you are African American.  Age. Risk increases with age.  Gender. Men are at higher risk than women before age 45 years. After age 65, women are at higher risk than men. Risk factors you can control include:  Not getting enough exercise or physical activity.  Being overweight.  Getting too much fat, sugar, calories, or salt in your diet.  Drinking too much alcohol. SIGNS AND SYMPTOMS Hypertension does not usually cause signs or symptoms. Extremely high blood pressure (hypertensive crisis) may cause headache, anxiety, shortness of breath, and nosebleed. DIAGNOSIS  To check if you have hypertension, your health care provider will measure your blood pressure while you are seated, with your arm held at the level of your heart. It should be measured at least twice using the same arm. Certain conditions can cause a difference in blood pressure between your right and left arms. A blood pressure reading that is higher than normal on one occasion does not mean that you need treatment. If one blood pressure reading is high, ask your health care provider about having it checked again. TREATMENT  Treating high blood pressure includes making lifestyle changes and possibly taking medicine. Living a healthy lifestyle can help lower high blood pressure. You may need to change some of your habits. Lifestyle changes may include:  Following the DASH diet. This diet is high in fruits, vegetables, and whole grains. It is low in salt, red meat, and added sugars.  Getting at least 2 hours of brisk physical activity every week.  Losing weight if necessary.  Not smoking.  Limiting   alcoholic beverages.  Learning ways to reduce stress. If lifestyle changes are not enough to get your blood pressure under control, your health care provider may  prescribe medicine. You may need to take more than one. Work closely with your health care provider to understand the risks and benefits. HOME CARE INSTRUCTIONS  Have your blood pressure rechecked as directed by your health care provider.   Take medicines only as directed by your health care provider. Follow the directions carefully. Blood pressure medicines must be taken as prescribed. The medicine does not work as well when you skip doses. Skipping doses also puts you at risk for problems.   Do not smoke.   Monitor your blood pressure at home as directed by your health care provider. SEEK MEDICAL CARE IF:   You think you are having a reaction to medicines taken.  You have recurrent headaches or feel dizzy.  You have swelling in your ankles.  You have trouble with your vision. SEEK IMMEDIATE MEDICAL CARE IF:  You develop a severe headache or confusion.  You have unusual weakness, numbness, or feel faint.  You have severe chest or abdominal pain.  You vomit repeatedly.  You have trouble breathing. MAKE SURE YOU:   Understand these instructions.  Will watch your condition.  Will get help right away if you are not doing well or get worse. Document Released: 04/02/2005 Document Revised: 08/17/2013 Document Reviewed: 01/23/2013 ExitCare Patient Information 2015 [employer]. This information is not intended to replace advice given to you by your health care provider. Make sure you discuss any questions you have with your health care provider.  

## 2014-02-22 NOTE — Progress Notes (Signed)
Pt comes today for routine f/u HTN,CAD s/o stroke with left side weakness,cholesterol and multiple medical problems Pt is complaint with taking prescribed medications daily Denies chest pain,sob or swelling States he has returned to smoking 10-12 cigarettes/per week Declined flu/Pna vaccine Following [facility] Cardiologist Dr. Mare Ferrari

## 2014-02-22 NOTE — Progress Notes (Signed)
Patient ID: [name], male   DOB: [birth year], 53 y.o.   MRN: [id number]   Rohen Kimes, is a 53 y.o. male  DEY:[id number]  DJS:[id number]  DOB - [birth year]  Chief Complaint  Patient presents with  . Follow-up  . Coronary Artery Disease  . Hypertension        Subjective:   [name] is a 53 y.o. male here today for a follow up visit. PMH significant for CAD s/p CABG complicated by acute myopericarditis and pericardial effusion leading to tamponade, CVA, dyslipidemia, tobacco use, and hypokalemia.  He has no specific complaints today.  He exercises most days with walking.  He has been watching his food intake.  He reports overall feeling well.  He is requesting a refill of his potassium, if he needs to continue the medication, and simvastatin which has previously been prescribed by another provider.  He has never had a colonoscopy and has decline the influenza and pneumococcal vaccinations.    No problems updated.  ALLERGIES: No Known Allergies  PAST MEDICAL HISTORY: Past Medical History  Diagnosis Date  . Coronary artery disease     a. s/p MI in 2009 in [place] with stenting of the LCX and LAD;  b. 12/2012 NSTEMI/CAD: LM nl, LAD patent prox stent, LCX 50-70 isr (FFR 0.84), RCA dom, 139m EF 40-45%-->Med Rx. c. 01/2013: anterolateral STEMI complicated by pericardial effusion (presumed purulent pericarditis) and tamponade s/p drain, ruptured LV pseudoaneurysm s/p CorMatrix patch, CABGx1 (SVG-OM1), fever, CVA, VDRF, C diff.  . Tobacco abuse     a. quit 12/2012.  . Cardiomyopathy     a. Sept/Oct 2014: EF ~40% (ICM). b. 03/2013: EF 25-30%. Off ACEI due to hypotension (MIXED NICM/ICM).  . Hyperlipidemia   . Pericardial effusion     a. 01/2013: pericardial effusion (presumed purulent pericarditis) and cardiac tamponade s/p drain. b. Persistent moderate pericardial effusion 03/2013.  . Cardiac tamponade     a. 01/2013 s/p drain.  . Pseudoaneurysm of left ventricle of heart    a. 01/2013:  Ruptured inferoposterior LV pseudoaneurysm s/p CorMatrix patch.  . CVA (cerebral infarction)     a. 01/2013 in setting of prolonged hospitalization - residual L arm weakness.  . C. difficile colitis     a. 01/2013 during prolonged adm. b. Recurred 03/2013.  .Marland KitchenAcute respiratory failure     a. 01/2013: VDRF. b. 03/2013: hypoxia requiring supp O2 during adm, resolved by discharge.  .Marland KitchenHyponatremia     a. During late 2014.  . 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a. 03/2013: readm with hypoxia, tachycardia, elevated ESR/CRP, elevated troponin with 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and myopericarditis; treated with steroids.  . Acute myopericarditis     a. 03/2013: readm with hypoxia, tachycardia, elevated ESR/CRP, elevated troponin with 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and myopericarditis; treated with steroids.  .Marland KitchenDysphagia     a. 03/2013: felt possibly due to globus hystericus;barium swallow normal 03/2013, recent normal esophagram 01/2013. GI did not feel any further w/u needed at that time.  . Orthostatic hypotension     a. Profound during 03/2013 admission (BP dropping to 588'Fsystolic) requiring meds scaled back.  . Physical deconditioning   . Anemia due to acute blood loss     a. 01/2013 hospitalization (s/p surgery).    MEDICATIONS AT HOME: Prior to Admission medications   Medication Sig Start Date End Date Taking? Authorizing Provider  aspirin 81 MG EC tablet Take 1 tablet (81 mg total) by mouth daily. 06/22/13  Yes Olugbemiga EEssie Christine  MD  carvedilol (COREG) 25 MG tablet Take 1 tablet (25 mg total) by mouth 2 (two) times daily with a meal. 11/27/13  Yes Darlin Coco, MD  clopidogrel (PLAVIX) 75 MG tablet Take 1 tablet (75 mg total) by mouth daily. 06/22/13  Yes Tresa Garter, MD  famotidine (PEPCID) 20 MG tablet Take 1 tablet (20 mg total) by mouth 2 (two) times daily. 05/26/13  Yes Tresa Garter, MD  lisinopril (PRINIVIL,ZESTRIL) 10 MG tablet Take 1 tablet (10 mg total) by mouth daily. 02/16/14  Yes  Tresa Garter, MD  nitroGLYCERIN (NITROSTAT) 0.4 MG SL tablet Place 1 tablet (0.4 mg total) under the tongue every 5 (five) minutes as needed for chest pain (up to 3 doses). 04/17/13   Darlin Coco, MD  ondansetron (ZOFRAN) 4 MG tablet Take 1 tablet (4 mg total) by mouth every 6 (six) hours. 08/06/13   Antonietta Breach, PA-C   Review of Systems  Constitutional: Negative.   HENT: Negative.   Eyes: Negative.   Respiratory: Negative.   Cardiovascular: Negative.   Gastrointestinal: Negative.   Genitourinary: Negative.   Musculoskeletal: Negative.   Skin: Negative.   Neurological: Negative.   Endo/Heme/Allergies: Negative.   Psychiatric/Behavioral: Negative.      Objective:   Filed Vitals:   02/22/14 1628  BP: 110/70  Pulse: 60  Temp: 98.5 F (36.9 C)  TempSrc: Oral  Resp: 18  Height: 6' 1"  (1.854 m)  Weight: 229 lb (103.874 kg)  SpO2: 100%    Exam General appearance : Awake, alert, not in any distress. Speech Clear. Not toxic looking HEENT: Atraumatic and Normocephalic, pupils equally reactive to light and accomodation Neck: supple, no JVD. No cervical lymphadenopathy.  Chest:Good air entry bilaterally, no added sounds  CVS: S1 S2 regular, no murmurs.  Abdomen: Bowel sounds present, Non tender and not distended with no gaurding, rigidity or rebound. Extremities: B/L Lower Ext shows no edema, both legs are warm to touch Neurology: Awake alert, and oriented X 3, CN II-XII intact, Non focal Skin:No Rash Wounds:N/A  Data Review Lab Results  Component Value Date   HGBA1C 5.4 06/22/2013   HGBA1C 5.7* 01/05/2013     Assessment & Plan   1. CAD s/p CABG P: continue carvedilol, lisinopril  2. Dyslipidemia P: Refill simvastatin 20 mg daily  3. Hypokalemia P: BMET to assess need to K supplementation  Gabriel Cirri, FNP-student  Assessment & Plan   1. Dyslipidemia  - simvastatin (ZOCOR) 40 MG tablet; Take 1 tablet (40 mg total) by mouth daily at 6 PM.   Dispense: 90 tablet; Refill: 3 - COMPLETE METABOLIC PANEL WITH GFR - Lipid panel  To address this please limit saturated fat to no more than 7% of your calories, limit cholesterol to 200 mg/day, increase fiber and exercise as tolerated. If needed we may add another cholesterol lowering medication to your regimen.   2. Smoking addiction  The patient was counseled on the dangers of tobacco use, and was advised to quit. Reviewed strategies to maximize success, including removing cigarettes and smoking materials from environment, stress management and support of family/friends.   3. Colon cancer screening  - [facility] COLONOSCOPY - Ambulatory referral to Gastroenterology  Patient have been counseled extensively about nutrition and exercise  Return for Follow up HTN, Heart Failure and Hypertension.  The patient was given clear instructions to go to ER or return to medical center if symptoms don't improve, worsen or new problems develop. The patient verbalized understanding. The patient was  told to call to get lab results if they haven't heard anything in the next week.   This note has been created with Surveyor, quantity. Any transcriptional errors are unintentional.    Angelica Chessman, MD, [place], [place], [place] and [place], [place]   02/22/2014, 4:48 PM

## 2014-02-23 ENCOUNTER — Encounter: Payer: Self-pay | Admitting: Internal Medicine

## 2014-02-23 LAB — LIPID PANEL
CHOL/HDL RATIO: 3 ratio
Cholesterol: 143 mg/dL (ref 0–200)
HDL: 48 mg/dL (ref >39)
LDL Cholesterol: 79 mg/dL (ref 0–99)
Triglycerides: 78 mg/dL (ref <150)
VLDL: 16 mg/dL (ref 0–40)

## 2014-02-23 LAB — COMPLETE METABOLIC PANEL WITH GFR
ALBUMIN: 4.3 g/dL (ref 3.5–5.2)
ALK PHOS: 119 U/L — AB (ref 39–117)
ALT: 27 U/L (ref 0–53)
AST: 25 U/L (ref 0–37)
BUN: 11 mg/dL (ref 6–23)
CO2: 26 mEq/L (ref 19–32)
Calcium: 9.3 mg/dL (ref 8.4–10.5)
Chloride: 108 mEq/L (ref 96–112)
Creat: 0.89 mg/dL (ref 0.50–1.35)
GFR, Est African American: >89 mL/min
GFR, Est Non African American: >89 mL/min
Glucose, Bld: 79 mg/dL (ref 70–99)
POTASSIUM: 4.4 meq/L (ref 3.5–5.3)
Sodium: 139 mEq/L (ref 135–145)
TOTAL PROTEIN: 6.7 g/dL (ref 6.0–8.3)
Total Bilirubin: 0.5 mg/dL (ref 0.2–1.2)

## 2014-03-09 ENCOUNTER — Telehealth: Payer: Self-pay | Admitting: Emergency Medicine

## 2014-03-09 NOTE — Telephone Encounter (Signed)
-----   Message from Tresa Garter, MD sent at 03/09/2014 11:40 AM EST ----- Please inform patient that his laboratory test result is within normal limits including his cholesterol level.

## 2014-03-09 NOTE — Telephone Encounter (Signed)
Left message with normal lab results.

## 2014-03-25 ENCOUNTER — Encounter (HOSPITAL_COMMUNITY): Payer: Self-pay | Admitting: Cardiology

## 2014-03-29 ENCOUNTER — Ambulatory Visit: Payer: Medicaid Other | Admitting: Cardiology

## 2014-04-05 ENCOUNTER — Emergency Department (HOSPITAL_COMMUNITY): Payer: Medicaid Other

## 2014-04-05 ENCOUNTER — Other Ambulatory Visit: Payer: Self-pay | Admitting: Cardiology

## 2014-04-05 ENCOUNTER — Encounter (HOSPITAL_COMMUNITY): Payer: Self-pay | Admitting: Family Medicine

## 2014-04-05 ENCOUNTER — Emergency Department (HOSPITAL_COMMUNITY)
Admission: EM | Admit: 2014-04-05 | Discharge: 2014-04-05 | Disposition: A | Discharge status: Home / Self Care | Payer: Medicaid Other | Attending: Emergency Medicine | Admitting: Emergency Medicine

## 2014-04-05 DIAGNOSIS — Z79899 Other long term (current) drug therapy: Secondary | ICD-10-CM | POA: Insufficient documentation

## 2014-04-05 DIAGNOSIS — Z951 Presence of aortocoronary bypass graft: Secondary | ICD-10-CM | POA: Insufficient documentation

## 2014-04-05 DIAGNOSIS — Z9889 Other specified postprocedural states: Secondary | ICD-10-CM | POA: Diagnosis not present

## 2014-04-05 DIAGNOSIS — E871 Hypo-osmolality and hyponatremia: Secondary | ICD-10-CM | POA: Insufficient documentation

## 2014-04-05 DIAGNOSIS — Z7982 Long term (current) use of aspirin: Secondary | ICD-10-CM | POA: Diagnosis not present

## 2014-04-05 DIAGNOSIS — Z8709 Personal history of other diseases of the respiratory system: Secondary | ICD-10-CM | POA: Diagnosis not present

## 2014-04-05 DIAGNOSIS — Z87891 Personal history of nicotine dependence: Secondary | ICD-10-CM | POA: Insufficient documentation

## 2014-04-05 DIAGNOSIS — R112 Nausea with vomiting, unspecified: Secondary | ICD-10-CM | POA: Diagnosis present

## 2014-04-05 DIAGNOSIS — I2510 Atherosclerotic heart disease of native coronary artery without angina pectoris: Secondary | ICD-10-CM | POA: Diagnosis not present

## 2014-04-05 DIAGNOSIS — Z862 Personal history of diseases of the blood and blood-forming organs and certain disorders involving the immune mechanism: Secondary | ICD-10-CM | POA: Insufficient documentation

## 2014-04-05 DIAGNOSIS — Z955 Presence of coronary angioplasty implant and graft: Secondary | ICD-10-CM | POA: Insufficient documentation

## 2014-04-05 DIAGNOSIS — Z7902 Long term (current) use of antithrombotics/antiplatelets: Secondary | ICD-10-CM | POA: Diagnosis not present

## 2014-04-05 DIAGNOSIS — Z8619 Personal history of other infectious and parasitic diseases: Secondary | ICD-10-CM | POA: Diagnosis not present

## 2014-04-05 DIAGNOSIS — R079 Chest pain, unspecified: Secondary | ICD-10-CM | POA: Insufficient documentation

## 2014-04-05 DIAGNOSIS — R5383 Other fatigue: Secondary | ICD-10-CM | POA: Diagnosis not present

## 2014-04-05 DIAGNOSIS — Z8673 Personal history of transient ischemic attack (TIA), and cerebral infarction without residual deficits: Secondary | ICD-10-CM | POA: Diagnosis not present

## 2014-04-05 DIAGNOSIS — E785 Hyperlipidemia, unspecified: Secondary | ICD-10-CM | POA: Insufficient documentation

## 2014-04-05 DIAGNOSIS — R110 Nausea: Secondary | ICD-10-CM

## 2014-04-05 LAB — COMPREHENSIVE METABOLIC PANEL
ALT: 24 U/L (ref 0–53)
AST: 21 U/L (ref 0–37)
Albumin: 3.6 g/dL (ref 3.5–5.2)
Alkaline Phosphatase: 162 U/L — ABNORMAL HIGH (ref 39–117)
Anion gap: 15 (ref 5–15)
BILIRUBIN TOTAL: 0.6 mg/dL (ref 0.3–1.2)
BUN: 10 mg/dL (ref 6–23)
CHLORIDE: 100 meq/L (ref 96–112)
CO2: 22 meq/L (ref 19–32)
Calcium: 8.9 mg/dL (ref 8.4–10.5)
Creatinine, Ser: 0.73 mg/dL (ref 0.50–1.35)
GFR calc Af Amer: >90 mL/min (ref >90)
Glucose, Bld: 109 mg/dL — ABNORMAL HIGH (ref 70–99)
Potassium: 4 mEq/L (ref 3.7–5.3)
SODIUM: 137 meq/L (ref 137–147)
Total Protein: 7.5 g/dL (ref 6.0–8.3)

## 2014-04-05 LAB — CBC
HCT: 45.6 % (ref 39.0–52.0)
Hemoglobin: 15.6 g/dL (ref 13.0–17.0)
MCH: 28 pg (ref 26.0–34.0)
MCHC: 34.2 g/dL (ref 30.0–36.0)
MCV: 81.9 fL (ref 78.0–100.0)
PLATELETS: 218 10*3/uL (ref 150–400)
RBC: 5.57 MIL/uL (ref 4.22–5.81)
RDW: 13.8 % (ref 11.5–15.5)
WBC: 5.9 10*3/uL (ref 4.0–10.5)

## 2014-04-05 LAB — I-STAT CHEM 8, ED
BUN: 9 mg/dL (ref 6–23)
CHLORIDE: 104 meq/L (ref 96–112)
Calcium, Ion: 1.09 mmol/L — ABNORMAL LOW (ref 1.12–1.23)
Creatinine, Ser: 0.7 mg/dL (ref 0.50–1.35)
Glucose, Bld: 117 mg/dL — ABNORMAL HIGH (ref 70–99)
HEMATOCRIT: 49 % (ref 39.0–52.0)
Hemoglobin: 16.7 g/dL (ref 13.0–17.0)
POTASSIUM: 3.9 meq/L (ref 3.7–5.3)
Sodium: 137 mEq/L (ref 137–147)
TCO2: 20 mmol/L (ref 0–100)

## 2014-04-05 LAB — PROTIME-INR
INR: 1.19 (ref 0.00–1.49)
Prothrombin Time: 15.3 seconds — ABNORMAL HIGH (ref 11.6–15.2)

## 2014-04-05 LAB — I-STAT TROPONIN, ED: Troponin i, poc: 0.02 ng/mL (ref 0.00–0.08)

## 2014-04-05 LAB — LIPASE, BLOOD: Lipase: 99 U/L — ABNORMAL HIGH (ref 11–59)

## 2014-04-05 LAB — TROPONIN I

## 2014-04-05 LAB — PRO B NATRIURETIC PEPTIDE
Pro B Natriuretic peptide (BNP): 614.8 pg/mL — ABNORMAL HIGH (ref 0–125)
Pro B Natriuretic peptide (BNP): 579.9 pg/mL — ABNORMAL HIGH (ref 0–125)

## 2014-04-05 MED ORDER — ONDANSETRON HCL 4 MG/2ML IJ SOLN
4.0000 mg | Freq: Once | INTRAMUSCULAR | Status: AC
  Administered 2014-04-05: 4 mg via INTRAVENOUS
  Filled 2014-04-05: qty 2

## 2014-04-05 MED ORDER — ONDANSETRON 4 MG PO TBDP: 4.0000 mg | ORAL_TABLET | Freq: Three times a day (TID) | ORAL | Status: DC | PRN

## 2014-04-05 NOTE — ED Notes (Signed)
Per pt sts nausea and intermittent chest pains x 2 days. Denies pain currently. Denies SOB.

## 2014-04-05 NOTE — ED Provider Notes (Signed)
CSN: [id number]     Arrival date & time 04/05/14  4098 History   First MD Initiated Contact with Patient 04/05/14 0945     Chief Complaint  Patient presents with  . Nausea  . Chest Pain     (Consider location/radiation/quality/duration/timing/severity/associated sxs/prior Treatment) Patient is a 53 y.o. male presenting with chest pain and general illness.  Chest Pain Associated symptoms: fatigue, nausea and vomiting (one episode)   Associated symptoms: no abdominal pain, no back pain, no cough, no fever, no headache and no shortness of breath   Illness Location:  Nausea Quality:  Constant Severity:  Moderate Onset quality:  Gradual Duration:  1 week Timing:  Constant Progression:  Waxing and waning Chronicity:  New Relieved by:  Nothing Worsened by:  Exertion, eating Ineffective treatments:  None Associated symptoms: chest pain (1 episode yesterday, dull, lasted less than 30 seconds, did not feel like prior MI), fatigue, nausea and vomiting (one episode)   Associated symptoms: no abdominal pain, no congestion, no cough, no diarrhea, no fever, no headaches, no loss of consciousness, no rash, no rhinorrhea, no shortness of breath, no sore throat and no wheezing   Risk factors:  CAD, cardiomyopathy, stroke   Past Medical History  Diagnosis Date  . Coronary artery disease     a. s/p MI in 2009 in [place] with stenting of the LCX and LAD;  b. 12/2012 NSTEMI/CAD: LM nl, LAD patent prox stent, LCX 50-70 isr (FFR 0.84), RCA dom, 11m EF 40-45%-->Med Rx. c. 01/2013: anterolateral STEMI complicated by pericardial effusion (presumed purulent pericarditis) and tamponade s/p drain, ruptured LV pseudoaneurysm s/p CorMatrix patch, CABGx1 (SVG-OM1), fever, CVA, VDRF, C diff.  . Tobacco abuse     a. quit 12/2012.  . Cardiomyopathy     a. Sept/Oct 2014: EF ~40% (ICM). b. 03/2013: EF 25-30%. Off ACEI due to hypotension (MIXED NICM/ICM).  . Hyperlipidemia   . Pericardial effusion     a. 01/2013:  pericardial effusion (presumed purulent pericarditis) and cardiac tamponade s/p drain. b. Persistent moderate pericardial effusion 03/2013.  . Cardiac tamponade     a. 01/2013 s/p drain.  . Pseudoaneurysm of left ventricle of heart     a. 01/2013:  Ruptured inferoposterior LV pseudoaneurysm s/p CorMatrix patch.  . CVA (cerebral infarction)     a. 01/2013 in setting of prolonged hospitalization - residual L arm weakness.  . C. difficile colitis     a. 01/2013 during prolonged adm. b. Recurred 03/2013.  .Marland KitchenAcute respiratory failure     a. 01/2013: VDRF. b. 03/2013: hypoxia requiring supp O2 during adm, resolved by discharge.  .Marland KitchenHyponatremia     a. During late 2014.  . 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a. 03/2013: readm with hypoxia, tachycardia, elevated ESR/CRP, elevated troponin with 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and myopericarditis; treated with steroids.  . Acute myopericarditis     a. 03/2013: readm with hypoxia, tachycardia, elevated ESR/CRP, elevated troponin with 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and myopericarditis; treated with steroids.  .Marland KitchenDysphagia     a. 03/2013: felt possibly due to globus hystericus;barium swallow normal 03/2013, recent normal esophagram 01/2013. GI did not feel any further w/u needed at that time.  . Orthostatic hypotension     a. Profound during 03/2013 admission (BP dropping to 511'Bsystolic) requiring meds scaled back.  . Physical deconditioning   . Anemia due to acute blood loss     a. 01/2013 hospitalization (s/p surgery).   Past Surgical History  Procedure Laterality Date  . Coronary angioplasty  with stent placement  2000's X 2    "1 + 1" (01/06/2013)  . Cardiac catheterization  01/06/2013  . Coronary artery bypass graft N/A 01/28/2013    Procedure: CORONARY ARTERY BYPASS GRAFTING (CABG);  Surgeon: Melrose Nakayama, MD;  Location: [facility];  Service: Open Heart Surgery;  Laterality: N/A;  CABG x one, using left greater saphenous vein harvested endoscopically  . Intraoperative  transesophageal echocardiogram N/A 01/28/2013    Procedure: INTRAOPERATIVE TRANSESOPHAGEAL ECHOCARDIOGRAM;  Surgeon: Melrose Nakayama, MD;  Location: [facility];  Service: Open Heart Surgery;  Laterality: N/A;  . Ventricular aneurysm resection N/A 01/28/2013    Procedure: LEFT VENTRICULAR ANEURYSM REPAIR;  Surgeon: Melrose Nakayama, MD;  Location: [facility];  Service: Open Heart Surgery;  Laterality: N/A;  . Left heart catheterization with coronary angiogram N/A 01/06/2013    Procedure: LEFT HEART CATHETERIZATION WITH CORONARY ANGIOGRAM;  Surgeon: Peter M Martinique, MD;  Location[facility] C[facility] LAB;  Service: Cardiovascular;  Laterality: N/A;  . Left heart catheterization with coronary angiogram N/A 01/21/2013    Procedure: LEFT HEART CATHETERIZATION WITH CORONARY ANGIOGRAM;  Surgeon: Burnell Blanks, MD;  Location[facility] CATH LAB;  Service: Cardiovascular;  Laterality: N/A;  . Pericardial tap N/A 01/24/2013    Procedure: PERICARDIAL TAP;  Surgeon: Blane Ohara, MD;  Location: [facility] CATH LAB;  Service: Cardiovascular;  Laterality: N/A;  . Right heart catheterization Right 01/24/2013    Procedure: RIGHT HEART CATH;  Surgeon: Blane Ohara, MD;  Location: [facility] CATH LAB;  Service: Cardiovascular;  Laterality: Right;   Family History  Problem Relation Age of Onset  . CAD    . Heart attack Mother    History  Substance Use Topics  . Smoking status: Former Smoker -- 1.00 packs/day for 35 years    Types: Cigarettes    Quit date: 01/06/2013  . Smokeless tobacco: Never Used     Comment: smokes 10-12 cigarettes per week  . Alcohol Use: No    Review of Systems  Constitutional: Positive for fatigue. Negative for fever.  HENT: Negative for congestion, rhinorrhea and sore throat.   Eyes: Negative for visual disturbance.  Respiratory: Negative for cough, shortness of breath and wheezing.   Cardiovascular: Positive for chest pain (1 episode yesterday, dull, lasted less than 30 seconds, did not feel like  prior MI).  Gastrointestinal: Positive for nausea and vomiting (one episode). Negative for abdominal pain and diarrhea.  Genitourinary: Negative for difficulty urinating.  Musculoskeletal: Negative for back pain and neck stiffness.  Skin: Negative for rash.  Neurological: Negative for loss of consciousness, syncope and headaches.      Allergies  Review of patient's allergies indicates no known allergies.  Home Medications   Prior to Admission medications   Medication Sig Start Date End Date Taking? Authorizing Provider  aspirin 81 MG EC tablet Take 1 tablet (81 mg total) by mouth daily. 06/22/13  Yes Tresa Garter, MD  carvedilol (COREG) 25 MG tablet Take 1 tablet (25 mg total) by mouth 2 (two) times daily with a meal. 11/27/13  Yes Darlin Coco, MD  clopidogrel (PLAVIX) 75 MG tablet Take 1 tablet (75 mg total) by mouth daily. 06/22/13  Yes Tresa Garter, MD  lisinopril (PRINIVIL,ZESTRIL) 10 MG tablet Take 1 tablet (10 mg total) by mouth daily. 02/16/14  Yes Tresa Garter, MD  nitroGLYCERIN (NITROSTAT) 0.4 MG SL tablet Place 1 tablet (0.4 mg total) under the tongue every 5 (five) minutes as needed for chest pain (up  to 3 doses). 04/17/13  Yes Darlin Coco, MD  simvastatin (ZOCOR) 40 MG tablet Take 1 tablet (40 mg total) by mouth daily at 6 PM. 02/22/14  Yes Olugbemiga E Doreene Burke, MD  famotidine (PEPCID) 20 MG tablet Take 1 tablet (20 mg total) by mouth 2 (two) times daily. Patient not taking: Reported on 04/05/2014 05/26/13   Tresa Garter, MD  ondansetron (ZOFRAN ODT) 4 MG disintegrating tablet Take 1 tablet (4 mg total) by mouth every 8 (eight) hours as needed for nausea or vomiting. 04/05/14   Alvino Chapel, MD  ondansetron (ZOFRAN) 4 MG tablet Take 1 tablet (4 mg total) by mouth every 6 (six) hours. Patient not taking: Reported on 04/05/2014 08/06/13   Antonietta Breach, PA-C   BP 116/87 mmHg  Pulse 70  Temp(Src) 98.3 F (36.8 C) (Oral)  Resp 17  Ht 6'  1" (1.854 m)  Wt 219 lb (99.338 kg)  BMI 28.90 kg/m2  SpO2 97% Physical Exam  Constitutional: He is oriented to person, place, and time. He appears well-developed and well-nourished. No distress.  HENT:  Head: Normocephalic and atraumatic.  Eyes: Conjunctivae and EOM are normal.  Neck: Normal range of motion.  Cardiovascular: Normal rate, regular rhythm, normal heart sounds and intact distal pulses.  Exam reveals no gallop and no friction rub.   No murmur heard. Pulmonary/Chest: Effort normal and breath sounds normal. No respiratory distress. He has no wheezes. He has no rales.  Abdominal: Soft. He exhibits no distension. There is no tenderness. There is no guarding.  Musculoskeletal: He exhibits no edema.  Neurological: He is alert and oriented to person, place, and time.  Skin: Skin is warm and dry. He is not diaphoretic.  Nursing note and vitals reviewed.   ED Course  Procedures (including critical care time) Labs Review Labs Reviewed  PRO B NATRIURETIC PEPTIDE - Abnormal; Notable for the following:    Pro B Natriuretic peptide (BNP) 614.8 (*)    All other components within normal limits  PROTIME-INR - Abnormal; Notable for the following:    Prothrombin Time 15.3 (*)    All other components within normal limits  PRO B NATRIURETIC PEPTIDE - Abnormal; Notable for the following:    Pro B Natriuretic peptide (BNP) 579.9 (*)    All other components within normal limits  LIPASE, BLOOD - Abnormal; Notable for the following:    Lipase 99 (*)    All other components within normal limits  COMPREHENSIVE METABOLIC PANEL - Abnormal; Notable for the following:    Glucose, Bld 109 (*)    Alkaline Phosphatase 162 (*)    All other components within normal limits  I-STAT CHEM 8, ED - Abnormal; Notable for the following:    Glucose, Bld 117 (*)    Calcium, Ion 1.09 (*)    All other components within normal limits  CBC  TROPONIN I  TROPONIN I  I-STAT TROPOININ, ED  I-STAT TROPOININ, ED     Imaging Review Dg Chest 2 View  04/05/2014   CLINICAL DATA:  Nausea and intermittent chest pain for 2 days, history coronary artery disease, coronary stenting, CABG, cardiomyopathy, pericardial effusion, stroke, former smoker, LEFT ventricular pseudoaneurysm, prior myopericarditis  EXAM: CHEST  2 VIEW  COMPARISON:  08/06/2013  FINDINGS: Normal heart size post median sternotomy and prior coronary stenting.  Mediastinal contours and pulmonary vascularity normal.  Atherosclerotic calcification aortic arch.  Lungs clear.  No pleural effusion or pneumothorax.  Multilevel endplate spur formation thoracic spine.  IMPRESSION:  No acute abnormalities.   Electronically Signed   By: Lavonia Dana M.D.   On: 04/05/2014 10:31     EKG Interpretation   Date/Time:  Monday April 05 2014 09:41:54 EST Ventricular Rate:  85 PR Interval:  188 QRS Duration: 106 QT Interval:  392 QTC Calculation: 466 R Axis:   -27 Text Interpretation:  Normal sinus rhythm Biatrial enlargement Nonspecific  T wave abnormality No significant change since last tracing Confirmed by  Ashok Cordia  MD, Lennette Bihari ([id number]) on 04/05/2014 9:53:19 AM      MDM   Final diagnoses:  Nausea   53 year old male with a history of coronary artery disease, ischemic cardiomyopathy, history of Dressler syndrome/tamponade, CVA, pseudoaneurysm, smoking, diabetes presents with concern of one week of nausea with one episode of emesis.  Reports brief episode of chest pain lasting less than 30 seconds and feeling different than prior MI.  EKG shows nonspecific TW abnormality that is similar to prior.  No decreased voltage, lightheadedness or vital signs to indicate pericardial effusion/tamponade.  No signs of pericarditis on EKG.  Doubt cardiac ischemia given delta troponins negative and CP only lasted a few seconds.  BNP greatly decreased from prior.  CXR without signs of pneumonia or CHF. Patient denies headache/head trauma as cause of nausea.  No urinary or  intraabdominal symptoms.  Lipase mildly elevated at 99, however not enough to indicate pancreatitis.  Normal CBC.  CMP shows normal electrolytes and normal transaminases.  Unclear cause of nausea at this time, possible viral illness. Recommend close follow up with PCP and Cardiology and return if symptoms worsen.  Given rx of zofran for nausea.  Patient discharged in stable condition with understanding of reasons to return.     Alvino Chapel, MD 04/05/14 [place], MD 04/12/14 [phone]

## 2014-04-06 ENCOUNTER — Telehealth: Payer: Self-pay | Admitting: *Deleted

## 2014-04-06 NOTE — Telephone Encounter (Signed)
[name] was on the schedule for a colon with Dr Hilarie Fredrickson 04-28-2014. He has a very complicated medical history. Last EF 03-2013 was 25-30%, ha has hx of MI, hx of CVA, he is on plavix, had an Ed visit 04-05-14 for chest pain. [name] was called and his PV and colon were both cancelled and he was scheduled an Ov for 04-19-2014 with Amy Esterwood due to all of this and more. [name] was informed his colon would need to be done in the hospital setting due to all of his medical conditions and he verbalized understanding of this. [name] has never had a colonoscopy before per [name].  Lelan Pons PV

## 2014-04-13 ENCOUNTER — Encounter: Payer: Self-pay | Admitting: Gastroenterology

## 2014-04-19 ENCOUNTER — Ambulatory Visit: Payer: Medicaid Other | Admitting: Physician Assistant

## 2014-04-19 ENCOUNTER — Encounter: Payer: Self-pay | Admitting: *Deleted

## 2014-04-19 ENCOUNTER — Telehealth: Payer: Self-pay | Admitting: *Deleted

## 2014-04-19 NOTE — Telephone Encounter (Signed)
Called patient and he had a family emergency early this morning.  Rescheduled him to 04-26-2014 with Cecille Rubin Hvozdovic Pa-C for 9:15 am. Advised patient to bring medication list, insurance cards,and call us by Friday if he has to cancel.

## 2014-04-19 NOTE — Progress Notes (Unsigned)
Patient ID: [name], male   DOB: March 06, 1961, 54 y.o.   MRN: [id number] Per Amy Trellis Paganini PA-C, his appointment was for colon screening.  He has cardiac issues and a complicated medica history.  A colon screening is not a high priority for this patient.  Called patient and he had a family emergency and wants to reschedule which I did.

## 2014-04-26 ENCOUNTER — Ambulatory Visit: Payer: Medicaid Other | Admitting: Physician Assistant

## 2014-04-28 ENCOUNTER — Encounter: Payer: Medicaid Other | Admitting: Internal Medicine

## 2014-05-13 ENCOUNTER — Ambulatory Visit (INDEPENDENT_AMBULATORY_CARE_PROVIDER_SITE_OTHER): Payer: Medicaid Other | Admitting: Cardiology

## 2014-05-13 ENCOUNTER — Encounter: Payer: Self-pay | Admitting: Cardiology

## 2014-05-13 VITALS — BP 108/74 | HR 68 | Ht 73.0 in | Wt 220.4 lb

## 2014-05-13 DIAGNOSIS — B029 Zoster without complications: Secondary | ICD-10-CM

## 2014-05-13 DIAGNOSIS — I259 Chronic ischemic heart disease, unspecified: Secondary | ICD-10-CM

## 2014-05-13 DIAGNOSIS — D170 Benign lipomatous neoplasm of skin and subcutaneous tissue of head, face and neck: Secondary | ICD-10-CM

## 2014-05-13 DIAGNOSIS — E785 Hyperlipidemia, unspecified: Secondary | ICD-10-CM

## 2014-05-13 DIAGNOSIS — I429 Cardiomyopathy, unspecified: Secondary | ICD-10-CM

## 2014-05-13 NOTE — Patient Instructions (Signed)
Your physician recommends that you continue on your current medications as directed. Please refer to the Current Medication list given to you today.  Your physician recommends that you schedule a follow-up appointment in: 4 months with fasting labs (lp/bmet/hfp)  Please call your Primary Care Physician to arrange an appointment with an ENT physician for evaluation of lipoma on right side of neck. Referral must come from them secondary to your insurance coverage

## 2014-05-13 NOTE — Progress Notes (Signed)
Cardiology Office Note   Date:  05/13/2014   ID:  [name], DOB 01/21/1961, MRN [id number]  PCP:  Angelica Chessman, MD  Cardiologist:   Darlin Coco, MD   No chief complaint on file.     History of Present Illness: [name] is a 54 y.o. male who presents for follow-up office visit  This 54 year old African American male is seen back for a scheduled followup office visit. He currently is feeling well. He was in his usual state of health until September 2014 when he presented with chest pain and was cathetered and showed a totally occluded right and a 50% in-stent restenosis of the circumflex stent. He had had previous stents to the LAD and circumflex in [place] in 2007. The patient was readmitted with recurrent anterolateral ST elevation chest pain in October 2014 and was found to have a large pericardial effusion with tamponade.. A pericardial drain was placed. He was almost ready for discharge when he developed signs of a stroke with left arm weakness and left visual field defect. He then developed hemodynamic collapse with hypotension and tachycardia and fever of 104 and required intubation. 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 Roxan Hockey. 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 Echo showed moderate pericardial effusion. He also had recurrent C. difficile colitis. His ejection fraction by echo was 25% and a sedimentation rate was elevated. He was discharged to skilled nursing facility and was sent home on week ago. He is significantly improved clinically. He  is now living at home. He is walking a mile to a mile and a half a day. He is not having any chest pain. He denies any dizzy spells or syncope. He still has residual weakness of the left arm but no other residual weakness. His blood pressure has improved. The patient had an echocardiogram on 11/26/13 showing ejection fraction 30%  The patient is not having any exertional dyspnea or chest pain. Since we last saw him he developed shingles of the left chest about one week ago.  The skin lesions are drying up and his chest discomfort is less than previous. The patient has had a lipoma on the right side of his neck.  We will plan to send him to ENT for consideration of surgical excision.  Past Medical History  Diagnosis Date  . Coronary artery disease     a. s/p MI in 2009 in [place] with stenting of the LCX and LAD;  b. 12/2012 NSTEMI/CAD: LM nl, LAD patent prox stent, LCX 50-70 isr (FFR 0.84), RCA dom, 135m EF 40-45%-->Med Rx. c. 01/2013: anterolateral STEMI complicated by pericardial effusion (presumed purulent pericarditis) and tamponade s/p drain, ruptured LV pseudoaneurysm s/p CorMatrix patch, CABGx1 (SVG-OM1), fever, CVA, VDRF, C diff.  . Tobacco abuse     a. quit 12/2012.  . Cardiomyopathy     a. Sept/Oct 2014: EF ~40% (ICM). b. 03/2013: EF 25-30%. Off ACEI due to hypotension (MIXED NICM/ICM).  . Hyperlipidemia   . Pericardial effusion     a. 01/2013: pericardial effusion (presumed purulent pericarditis) and cardiac tamponade s/p drain. b. Persistent moderate pericardial effusion 03/2013.  . Cardiac  tamponade     a. 01/2013 s/p drain.  . Pseudoaneurysm of left ventricle of heart     a. 01/2013:  Ruptured inferoposterior LV pseudoaneurysm s/p CorMatrix patch.  . CVA (cerebral infarction)     a. 01/2013 in setting of prolonged hospitalization - residual L arm weakness.  . C. difficile colitis     a. 01/2013 during prolonged adm. b. Recurred 03/2013.  Marland Kitchen Acute respiratory failure     a. 01/2013:  VDRF. b. 03/2013: hypoxia requiring supp O2 during adm, resolved by discharge.  Marland Kitchen Hyponatremia     a. During late 2014.  . 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a. 03/2013: readm with hypoxia, tachycardia, elevated ESR/CRP, elevated troponin with 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and myopericarditis; treated with steroids.  . Acute myopericarditis     a. 03/2013: readm with hypoxia, tachycardia, elevated ESR/CRP, elevated troponin with 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and myopericarditis; treated with steroids.  Marland Kitchen Dysphagia     a. 03/2013: felt possibly due to globus hystericus;barium swallow normal 03/2013, recent normal esophagram 01/2013. GI did not feel any further w/u needed at that time.  . Orthostatic hypotension     a. Profound during 03/2013 admission (BP dropping to 27'C systolic) requiring meds scaled back.  . Physical deconditioning   . Anemia due to acute blood loss     a. 01/2013 hospitalization (s/p surgery).  . Non-STEMI (non-ST elevated myocardial infarction)   . Vision loss of right eye   . Hypokalemia     Past Surgical History  Procedure Laterality Date  . Coronary angioplasty with stent placement  2000's X 2    "1 + 1" (01/06/2013)  . Cardiac catheterization  01/06/2013  . Coronary artery bypass graft N/A 01/28/2013    Procedure: CORONARY ARTERY BYPASS GRAFTING (CABG);  Surgeon: Melrose Nakayama, MD;  Location: [facility];  Service: Open Heart Surgery;  Laterality: N/A;  CABG x one, using left greater saphenous vein harvested endoscopically  . Intraoperative transesophageal echocardiogram N/A 01/28/2013    Procedure: INTRAOPERATIVE TRANSESOPHAGEAL ECHOCARDIOGRAM;  Surgeon: Melrose Nakayama, MD;  Location: [facility];  Service: Open Heart Surgery;  Laterality: N/A;  . Ventricular aneurysm resection N/A 01/28/2013    Procedure: LEFT VENTRICULAR ANEURYSM REPAIR;  Surgeon: Melrose Nakayama, MD;  Location: [facility];  Service: Open Heart Surgery;  Laterality: N/A;  . Left heart catheterization with coronary angiogram N/A  01/06/2013    Procedure: LEFT HEART CATHETERIZATION WITH CORONARY ANGIOGRAM;  Surgeon: Peter M Martinique, MD;  Location: [facility] CATH LAB;  Service: Cardiovascular;  Laterality: N/A;  . Left heart catheterization with coronary angiogram N/A 01/21/2013    Procedure: LEFT HEART CATHETERIZATION WITH CORONARY ANGIOGRAM;  Surgeon: Burnell Blanks, MD;  Location: [facility] CATH LAB;  Service: Cardiovascular;  Laterality: N/A;  . Pericardial tap N/A 01/24/2013    Procedure: PERICARDIAL TAP;  Surgeon: Blane Ohara, MD;  Location: [facility] CATH LAB;  Service: Cardiovascular;  Laterality: N/A;  . Right heart catheterization Right 01/24/2013    Procedure: RIGHT HEART CATH;  Surgeon: Blane Ohara, MD;  Location[facility] CATH LAB;  Service: Cardiovascular;  Laterality: Right;     Current Outpatient Prescriptions  Medication Sig Dispense Refill  . aspirin 81 MG EC tablet Take 1 tablet (81 mg total) by mouth daily. 90 tablet 3  . carvedilol (COREG) 25 MG tablet TAKE 1/2 TABLET BY MOUTH 2 TIMES DAILY WITH A MEAL (Patient taking differently: TAKE 1 TABLET BY MOUTH  DAILY WITH A MEAL) 30 tablet 2  .  clopidogrel (PLAVIX) 75 MG tablet Take 1 tablet (75 mg total) by mouth daily. 90 tablet 3  . famotidine (PEPCID) 20 MG tablet Take 1 tablet (20 mg total) by mouth 2 (two) times daily. 30 tablet 0  . lisinopril (PRINIVIL,ZESTRIL) 10 MG tablet Take 1 tablet (10 mg total) by mouth daily. 90 tablet 3  . nitroGLYCERIN (NITROSTAT) 0.4 MG SL tablet Place 1 tablet (0.4 mg total) under the tongue every 5 (five) minutes as needed for chest pain (up to 3 doses). 25 tablet 0  . ondansetron (ZOFRAN ODT) 4 MG disintegrating tablet Take 1 tablet (4 mg total) by mouth every 8 (eight) hours as needed for nausea or vomiting. 20 tablet 0  . simvastatin (ZOCOR) 40 MG tablet Take 1 tablet (40 mg total) by mouth daily at 6 PM. 90 tablet 3   No current facility-administered medications for this visit.    Allergies:   Review of patient's allergies  indicates no known allergies.    Social History:  The patient  reports that he quit smoking about 16 months ago. His smoking use included Cigarettes. He has a 35 pack-year smoking history. He has never used smokeless tobacco. He reports that he does not drink alcohol or use illicit drugs.   Family History:  The patient's family history includes CAD in an other family member; Heart attack in his mother.    ROS:  Please see the history of present illness.   Otherwise, review of systems are positive for recent herpes zoster left chest.   All other systems are reviewed and negative.    PHYSICAL EXAM: VS:  BP 108/74 mmHg  Pulse 68  Ht 6' 1"  (1.854 m)  Wt 220 lb 6.4 oz (99.973 kg)  BMI 29.08 kg/m2 , BMI Body mass index is 29.08 kg/(m^2). GEN: Well nourished, well developed, in no acute distress HEENT: normal Neck: no JVD, carotid bruits, and there is a soft lipoma of right neck Cardiac: RRR; no murmurs, rubs, or gallops,no edema  Respiratory:  clear to auscultation bilaterally, normal work of breathing GI: soft, nontender, nondistended, + BS MS: no deformity or atrophy Skin: warm and dry, no rash.  Resolving rash of shingles left chest Neuro:  Strength and sensation are intact Psych: euthymic mood, full affect   EKG:  EKG is ordered today. The ekg ordered today demonstrates sinus rhythm.  Old inferior wall myocardial infarction.  Nonspecific ST-T wave changes.  Since prior tracing of 04/05/14, no significant change except rate is slower today.   Recent Labs: 04/05/2014: ALT 24; BUN 9; Creatinine 0.70; Hemoglobin 16.7; Platelets 218; Potassium 3.9; Pro B Natriuretic peptide (BNP) 579.9*; Sodium 137    Lipid Panel    Component Value Date/Time   CHOL 143 02/22/2014 1709   TRIG 78 02/22/2014 1709   HDL 48 02/22/2014 1709   CHOLHDL 3.0 02/22/2014 1709   VLDL 16 02/22/2014 1709   LDLCALC 79 02/22/2014 1709      Wt Readings from Last 3 Encounters:  05/13/14 220 lb 6.4 oz (99.973  kg)  04/05/14 219 lb (99.338 kg)  02/22/14 229 lb (103.874 kg)      Other studies Reviewed: Additional studies/ records that were reviewed today include: Echocardiogram on 11/26/13 Review of the above records demonstrates: Left ventricular ejection fraction 30% with akinesis of inferior and lateral wall.   ASSESSMENT AND PLAN:  1. Ischemic heart disease status post CABG in October 2014 4 repair of ruptured inferoposterior left ventricular pseudoaneurysm into the pericardial sac with  resultant pericardial tamponade and shock. 2. past history of Dressler's myopericarditis postoperative 3. history of preoperative stroke with left arm weakness and left visual field defect 4. essential hypertension 5.  Herpes zoster left chest onset one week ago.  Continue conservative care. 6.  Lipoma of right neck.  Refer to ENT.  Because of his Medicaid insurance, the referral will need to be made by his primary care physician.   Current medicines are reviewed at length with the patient today.  The patient does not have concerns regarding medicines.  The following changes have been made:  no change  Labs/ tests ordered today include:  No orders of the defined types were placed in this encounter.     Disposition:   FU with Dr. Mare Ferrari in 4 months for office visit, lipid panel, hepatic function panel, and his metabolic panel Refer to ENT.  If necessary he may stop his Plavix 5 days prior to surgery but continue daily aspirin.   Signed, Darlin Coco, MD  05/13/2014 9:18 AM    [facility] [address], [place], [place]  [address] Phone: [phone]; Fax: [phone]

## 2014-05-24 ENCOUNTER — Ambulatory Visit: Payer: Managed Care, Other (non HMO) | Attending: Internal Medicine | Admitting: Internal Medicine

## 2014-05-24 ENCOUNTER — Encounter: Payer: Self-pay | Admitting: Internal Medicine

## 2014-05-24 VITALS — BP 109/76 | HR 84 | Temp 98.1°F | Ht 73.0 in | Wt 215.0 lb

## 2014-05-24 DIAGNOSIS — E785 Hyperlipidemia, unspecified: Secondary | ICD-10-CM | POA: Insufficient documentation

## 2014-05-24 DIAGNOSIS — Z8673 Personal history of transient ischemic attack (TIA), and cerebral infarction without residual deficits: Secondary | ICD-10-CM | POA: Diagnosis not present

## 2014-05-24 DIAGNOSIS — Z951 Presence of aortocoronary bypass graft: Secondary | ICD-10-CM | POA: Insufficient documentation

## 2014-05-24 DIAGNOSIS — Z1211 Encounter for screening for malignant neoplasm of colon: Secondary | ICD-10-CM | POA: Insufficient documentation

## 2014-05-24 DIAGNOSIS — F17210 Nicotine dependence, cigarettes, uncomplicated: Secondary | ICD-10-CM | POA: Insufficient documentation

## 2014-05-24 DIAGNOSIS — Z7982 Long term (current) use of aspirin: Secondary | ICD-10-CM | POA: Diagnosis not present

## 2014-05-24 DIAGNOSIS — I2510 Atherosclerotic heart disease of native coronary artery without angina pectoris: Secondary | ICD-10-CM | POA: Diagnosis not present

## 2014-05-24 DIAGNOSIS — D170 Benign lipomatous neoplasm of skin and subcutaneous tissue of head, face and neck: Secondary | ICD-10-CM | POA: Diagnosis present

## 2014-05-24 DIAGNOSIS — I429 Cardiomyopathy, unspecified: Secondary | ICD-10-CM | POA: Insufficient documentation

## 2014-05-24 DIAGNOSIS — I252 Old myocardial infarction: Secondary | ICD-10-CM | POA: Diagnosis not present

## 2014-05-24 DIAGNOSIS — Z9861 Coronary angioplasty status: Secondary | ICD-10-CM | POA: Diagnosis not present

## 2014-05-24 DIAGNOSIS — Z7902 Long term (current) use of antithrombotics/antiplatelets: Secondary | ICD-10-CM | POA: Diagnosis not present

## 2014-05-24 NOTE — Progress Notes (Signed)
Patient is here to get a referral to ENT to have a lipoma removed on right side of neck He is also interested in having the flu vaccine and shingles vaccine.

## 2014-05-24 NOTE — Progress Notes (Signed)
Patient ID: [name], male   DOB: [birth year], 54 y.o.   MRN: [id number]   [name], is a 54 y.o. male  WPV:[id number]  [id number]  DOB - [birth year]  Chief Complaint  Patient presents with  . Follow-up        Subjective:   [name] is a 54 y.o. male here today for a follow up visit. Patient's history is significant for coronary artery disease status post CABG, CVA, dyslipidemia, and tobacco abuse. He is here today for his swelling/lump on the right side of his neck which has been there for quite a while. No change in color, no change in size and no change in consistency but patient wants it removed. He seeks referral to ENT surgeon. He continues to smoke cigarette. He did not have his colonoscopy as ordered in the past,  requesting for influenza vaccine. Patient has No headache, No chest pain, No abdominal pain - No Nausea, No new weakness tingling or numbness, No Cough - SOB.  Problem  Colon Cancer Screening  Lipoma of Skin and Subcutaneous Tissue of Neck    ALLERGIES: No Known Allergies  PAST MEDICAL HISTORY: Past Medical History  Diagnosis Date  . Coronary artery disease     a. s/p MI in 2009 in [place] with stenting of the LCX and LAD;  b. 12/2012 NSTEMI/CAD: LM nl, LAD patent prox stent, LCX 50-70 isr (FFR 0.84), RCA dom, 135m EF 40-45%-->Med Rx. c. 01/2013: anterolateral STEMI complicated by pericardial effusion (presumed purulent pericarditis) and tamponade s/p drain, ruptured LV pseudoaneurysm s/p CorMatrix patch, CABGx1 (SVG-OM1), fever, CVA, VDRF, C diff.  . Tobacco abuse     a. quit 12/2012.  . Cardiomyopathy     a. Sept/Oct 2014: EF ~40% (ICM). b. 03/2013: EF 25-30%. Off ACEI due to hypotension (MIXED NICM/ICM).  . Hyperlipidemia   . Pericardial effusion     a. 01/2013: pericardial effusion (presumed purulent pericarditis) and cardiac tamponade s/p drain. b. Persistent moderate pericardial effusion 03/2013.  . Cardiac tamponade     a. 01/2013 s/p  drain.  . Pseudoaneurysm of left ventricle of heart     a. 01/2013:  Ruptured inferoposterior LV pseudoaneurysm s/p CorMatrix patch.  . CVA (cerebral infarction)     a. 01/2013 in setting of prolonged hospitalization - residual L arm weakness.  . C. difficile colitis     a. 01/2013 during prolonged adm. b. Recurred 03/2013.  .Marland KitchenAcute respiratory failure     a. 01/2013: VDRF. b. 03/2013: hypoxia requiring supp O2 during adm, resolved by discharge.  .Marland KitchenHyponatremia     a. During late 2014.  . 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a. 03/2013: readm with hypoxia, tachycardia, elevated ESR/CRP, elevated troponin with 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and myopericarditis; treated with steroids.  . Acute myopericarditis     a. 03/2013: readm with hypoxia, tachycardia, elevated ESR/CRP, elevated troponin with 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and myopericarditis; treated with steroids.  .Marland KitchenDysphagia     a. 03/2013: felt possibly due to globus hystericus;barium swallow normal 03/2013, recent normal esophagram 01/2013. GI did not feel any further w/u needed at that time.  . Orthostatic hypotension     a. Profound during 03/2013 admission (BP dropping to 549'Esystolic) requiring meds scaled back.  . Physical deconditioning   . Anemia due to acute blood loss     a. 01/2013 hospitalization (s/p surgery).  . Non-STEMI (non-ST elevated myocardial infarction)   . Vision loss of right eye   . Hypokalemia  MEDICATIONS AT HOME: Prior to Admission medications   Medication Sig Start Date End Date Taking? Authorizing Provider  aspirin 81 MG EC tablet Take 1 tablet (81 mg total) by mouth daily. 06/22/13  Yes Tresa Garter, MD  carvedilol (COREG) 25 MG tablet TAKE 1/2 TABLET BY MOUTH 2 TIMES DAILY WITH A MEAL Patient taking differently: TAKE 1 TABLET BY MOUTH  DAILY WITH A MEAL 04/06/14  Yes Darlin Coco, MD  clopidogrel (PLAVIX) 75 MG tablet Take 1 tablet (75 mg total) by mouth daily. 06/22/13  Yes Tresa Garter, MD  famotidine (PEPCID) 20  MG tablet Take 1 tablet (20 mg total) by mouth 2 (two) times daily. 05/26/13  Yes Tresa Garter, MD  lisinopril (PRINIVIL,ZESTRIL) 10 MG tablet Take 1 tablet (10 mg total) by mouth daily. 02/16/14  Yes Tresa Garter, MD  nitroGLYCERIN (NITROSTAT) 0.4 MG SL tablet Place 1 tablet (0.4 mg total) under the tongue every 5 (five) minutes as needed for chest pain (up to 3 doses). 04/17/13  Yes Darlin Coco, MD  ondansetron (ZOFRAN ODT) 4 MG disintegrating tablet Take 1 tablet (4 mg total) by mouth every 8 (eight) hours as needed for nausea or vomiting. 04/05/14  Yes Gareth Morgan, MD  simvastatin (ZOCOR) 40 MG tablet Take 1 tablet (40 mg total) by mouth daily at 6 PM. 02/22/14  Yes Tresa Garter, MD     Objective:   Filed Vitals:   05/24/14 1525  BP: 109/76  Pulse: 84  Temp: 98.1 F (36.7 C)  TempSrc: Oral  Height: 6' 1"  (1.854 m)  Weight: 215 lb (97.523 kg)  SpO2: 94%    Exam General appearance : Awake, alert, not in any distress. Speech Clear. Not toxic looking HEENT: Atraumatic and Normocephalic, pupils equally reactive to light and accomodation Neck: supple, no JVD. No cervical lymphadenopathy. Lump on the right side of the neck, about 3 x 5 cm, soft, freely mobile and not attached to underlying structures, non-tender. Chest:Good air entry bilaterally, no added sounds  CVS: S1 S2 regular, no murmurs.  Abdomen: Bowel sounds present, Non tender and not distended with no gaurding, rigidity or rebound. Extremities: B/L Lower Ext shows no edema, both legs are warm to touch Neurology: Awake alert, and oriented X 3, CN II-XII intact, Non focal Skin:No Rash Wounds:N/A  Data Review Lab Results  Component Value Date   HGBA1C 5.4 06/22/2013   HGBA1C 5.7* 01/05/2013     Assessment & Plan   1. Colon cancer screening  - [facility] COLONOSCOPY - Ambulatory referral to Gastroenterology  2. Lipoma of skin and subcutaneous tissue of neck  - Ambulatory referral to General  Surgery  Patient was counseled extensively about nutrition and exercise   Return in about 3 months (around 08/22/2014), or if symptoms worsen or fail to improve, for Heart Failure and Hypertension.  The patient was given clear instructions to go to ER or return to medical center if symptoms don't improve, worsen or new problems develop. The patient verbalized understanding. The patient was told to call to get lab results if they haven't heard anything in the next week.   This note has been created with Surveyor, quantity. Any transcriptional errors are unintentional.    Angelica Chessman, MD, [place], [place], [place] and [place], [place]   05/24/2014, 3:39 PM

## 2014-05-24 NOTE — Patient Instructions (Signed)
Lipoma  A lipoma is a noncancerous (benign) tumor composed of fat cells. They are usually found under the skin (subcutaneous). A lipoma may occur in any tissue of the body that contains fat. Common areas for lipomas to appear include the back, shoulders, buttocks, and thighs. Lipomas are a very common soft tissue growth. They are soft and grow slowly. Most problems caused by a lipoma depend on where it is growing.  DIAGNOSIS   A lipoma can be diagnosed with a physical exam. These tumors rarely become cancerous, but radiographic studies can help determine this for certain. Studies used may include:  · Computerized X-ray scans (CT or CAT scan).  · Computerized magnetic scans (MRI).  TREATMENT   Small lipomas that are not causing problems may be watched. If a lipoma continues to enlarge or causes problems, removal is often the best treatment. Lipomas can also be removed to improve appearance. Surgery is done to remove the fatty cells and the surrounding capsule. Most often, this is done with medicine that numbs the area (local anesthetic). The removed tissue is examined under a microscope to make sure it is not cancerous. Keep all follow-up appointments with your caregiver.  SEEK MEDICAL CARE IF:   · The lipoma becomes larger or hard.  · The lipoma becomes painful, red, or increasingly swollen. These could be signs of infection or a more serious condition.  Document Released: 03/23/2002 Document Revised: 06/25/2011 Document Reviewed: 09/02/2009  [employer]® Patient Information ©2015 [employer]. This information is not intended to replace advice given to you by your health care provider. Make sure you discuss any questions you have with your health care provider.

## 2014-06-07 ENCOUNTER — Ambulatory Visit (INDEPENDENT_AMBULATORY_CARE_PROVIDER_SITE_OTHER): Payer: Self-pay | Admitting: Surgery

## 2014-06-07 NOTE — H&P (Signed)
[name] 06/07/2014 3:15 PM Location: [facility] Patient #: [id number] DOB: [birth year] Single / Language: [name] / Race: Black or African American Male History of Present Illness Robert Moores A. Shamariah Shewmake MD; 06/07/2014 3:34 PM) Patient words: lipoma neck  pt sent at the request of Dr Doreene Burke for mass right neck. Soft non tender and mobile slowly growing. not causing any pain. Would like it removed.  The patient is a 54 year old male who presents with a complaint of mass. The onset of the mass has been gradual. The course has been increasing. The mass is described as mild. Other Problems [name], [name]; 06/07/2014 3:15 PM) Cerebrovascular Accident Congestive Heart Failure  Past Surgical History [name], CMA; 06/07/2014 3:15 PM) No pertinent past surgical history  Allergies [name], CMA; 06/07/2014 3:16 PM) No Known Drug Allergies 06/07/2014  Medication History ([name], CMA; 06/07/2014 3:17 PM) Carvedilol (25MG  Tablet, Oral) Active. Clopidogrel Bisulfate (75MG  Tablet, Oral) Active. Lisinopril (10MG  Tablet, Oral) Active. Simvastatin (40MG  Tablet, Oral) Active. Medications Reconciled  Social History [name], CMA; 06/07/2014 3:15 PM) No caffeine use  Family History [name], CMA; 06/07/2014 3:15 PM) Heart disease in male family member before age 32 Hypertension Mother.     Review of Systems [name] CMA; 06/07/2014 3:15 PM) General Not Present- Appetite Loss, Chills, Fatigue, Fever, Night Sweats, Weight Gain and Weight Loss. Skin Present- New Lesions. Not Present- Change in Wart/Mole, Dryness, Hives, Jaundice, Non-Healing Wounds, Rash and Ulcer. HEENT Present- Wears glasses/contact lenses. Not Present- Earache, Hearing Loss, Hoarseness, Nose Bleed, Oral Ulcers, Ringing in the Ears, Seasonal Allergies, Sinus Pain, Sore Throat, Visual Disturbances and Yellow Eyes. Respiratory Not Present- Bloody sputum, Chronic Cough, Difficulty  Breathing, Snoring and Wheezing. Cardiovascular Not Present- Chest Pain, Difficulty Breathing Lying Down, Leg Cramps, Palpitations, Rapid Heart Rate, Shortness of Breath and Swelling of Extremities. Male Genitourinary Present- Impotence. Not Present- Blood in Urine, Change in Urinary Stream, Frequency, Nocturia, Painful Urination, Urgency and Urine Leakage. Psychiatric Not Present- Anxiety, Bipolar, Change in Sleep Pattern, Depression, Fearful and Frequent crying.  Vitals ([name] CMA; 06/07/2014 3:16 PM) 06/07/2014 3:16 PM Weight: 228 lb Height: 73in Body Surface Area: 2.31 m Body Mass Index: 30.08 kg/m Temp.: 97.32F(Temporal)  Pulse: 81 (Regular)  BP: 130/76 (Sitting, Left Arm, Standard)     Physical Exam ([name] MD; 06/07/2014 3:34 PM)  General Mental Status-Alert. General Appearance-Consistent with stated age. Hydration-Well hydrated. Voice-Normal.  Head and Neck Note: 4 cm mobile mass right neck lobulated and subcutaneous lipoma   Chest and Lung Exam Chest and lung exam reveals -quiet, even and easy respiratory effort with no use of accessory muscles and on auscultation, normal breath sounds, no adventitious sounds and normal vocal resonance. Inspection Chest Wall - Normal. Back - normal.  Cardiovascular Cardiovascular examination reveals -on palpation PMI is normal in location and amplitude, no palpable S3 or S4. Normal cardiac borders., normal heart sounds, regular rate and rhythm with no murmurs, carotid auscultation reveals no bruits and normal pedal pulses bilaterally.  Neurologic Neurologic evaluation reveals -alert and oriented x 3 with no impairment of recent or remote memory. Mental Status-Normal.  Musculoskeletal Normal Exam - Left-Upper Extremity Strength Normal and Lower Extremity Strength Normal. Normal Exam - Right-Upper Extremity Strength Normal, Lower Extremity Weakness.    Assessment & Plan (Tamarcus Condie A.  Lashonta Pilling MD; 06/07/2014 3:38 PM)  LIPOMA OF SKIN AND SUBCUTANEOUS TISSUE OF NECK (214.1  D17.0) Impression: pt desires removal. Risk of bleeding, infection, recurrence and worsening  of underlying medical problems. He agrees to proceed.  Current Plans Pt Education - [facility] Pain Control (Gross) Pt Education - [facility] Wound Care Instructions (Gross): discussed with patient and provided information.

## 2014-07-05 ENCOUNTER — Other Ambulatory Visit: Payer: Self-pay | Admitting: Emergency Medicine

## 2014-07-05 ENCOUNTER — Other Ambulatory Visit: Payer: Self-pay | Admitting: Internal Medicine

## 2014-07-05 DIAGNOSIS — I213 ST elevation (STEMI) myocardial infarction of unspecified site: Secondary | ICD-10-CM

## 2014-07-05 MED ORDER — CLOPIDOGREL BISULFATE 75 MG PO TABS: 75.0000 mg | ORAL_TABLET | Freq: Every day | ORAL | Status: DC

## 2014-07-06 ENCOUNTER — Telehealth: Payer: Self-pay | Admitting: Emergency Medicine

## 2014-07-06 ENCOUNTER — Telehealth: Payer: Self-pay | Admitting: Internal Medicine

## 2014-07-06 NOTE — Telephone Encounter (Signed)
Patient called to speak to nurse, please f/u with pt.

## 2014-07-06 NOTE — Telephone Encounter (Signed)
Please f/u with pt in regards to medication refills.

## 2014-07-06 NOTE — Telephone Encounter (Signed)
Left message medication Plavix e-scribed to [facility] for pick up

## 2014-07-09 IMAGING — RF DG ESOPHAGUS
5 of 6 series · 15 of 24 positions shown · non-contrast
Comparison: None.

FLUOROSCOPY TIME:  1 min 23 seconds

CLINICAL DATA: Dysphagia. Recent strokes.

EXAM:
ESOPHOGRAM/BARIUM SWALLOW
TECHNIQUE: Single contrast examination was performed using thin and thick
barium as well as 13 mm barium tablet

[Series 1: run · 1 of 5 slices shown (1 of 5)]
[im 1/5]
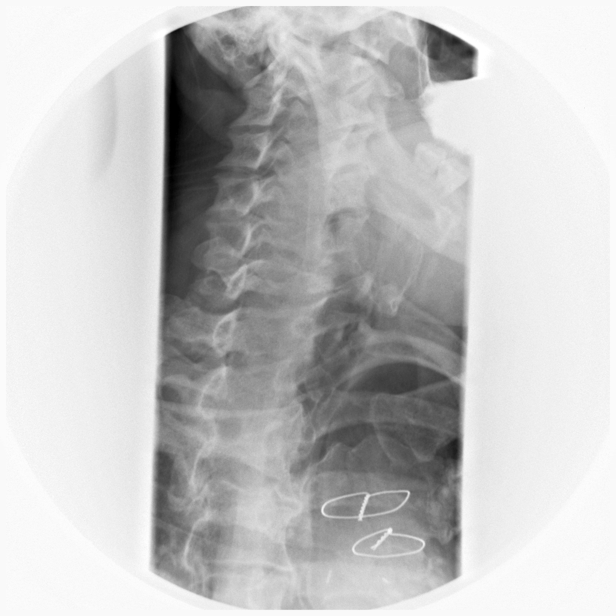

[Series 2: run · 3 of 7 slices shown (2 of 5)]
[im 1/7]
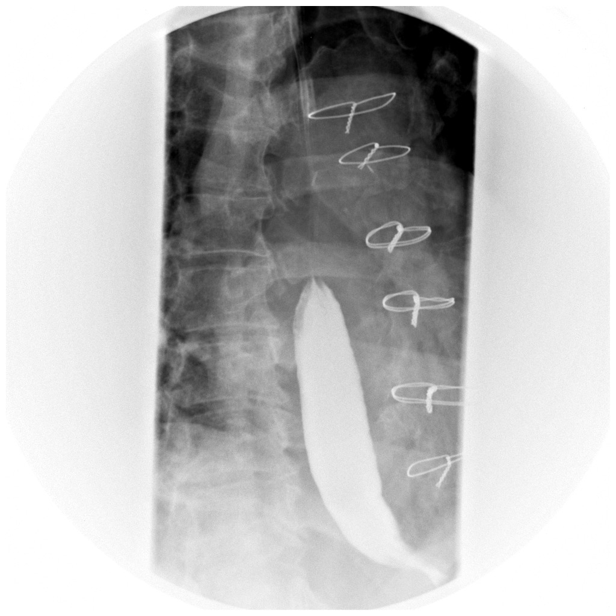
[im 5/7]
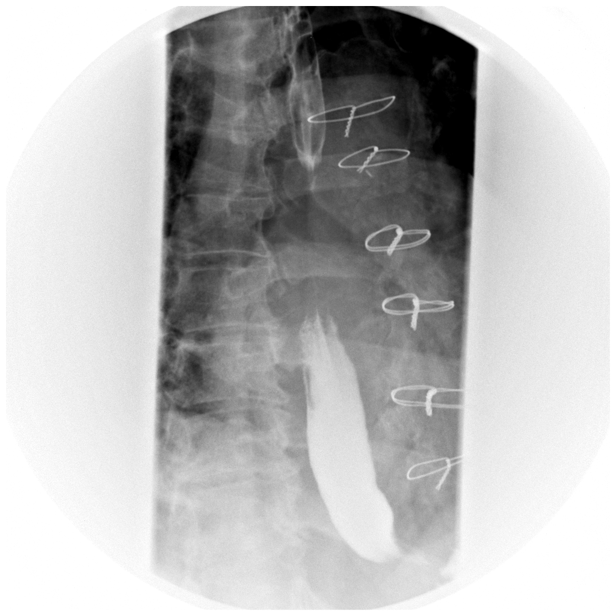
[im 7/7]
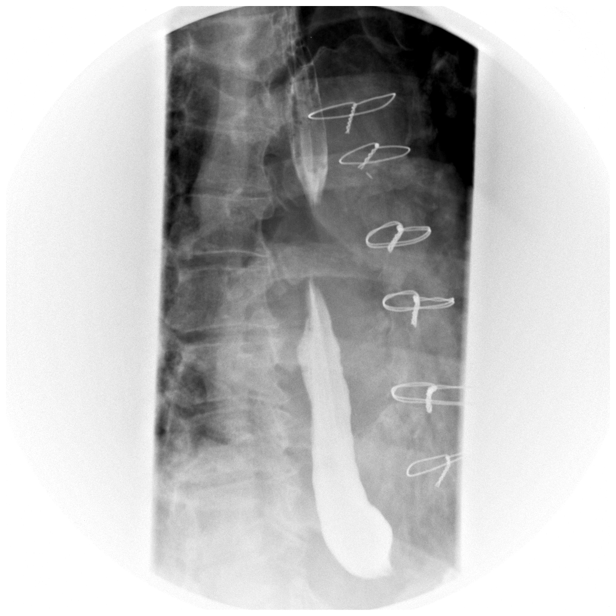

[Series 3: run · 9 of 25 slices shown (3 of 5)]
[im 2/25]
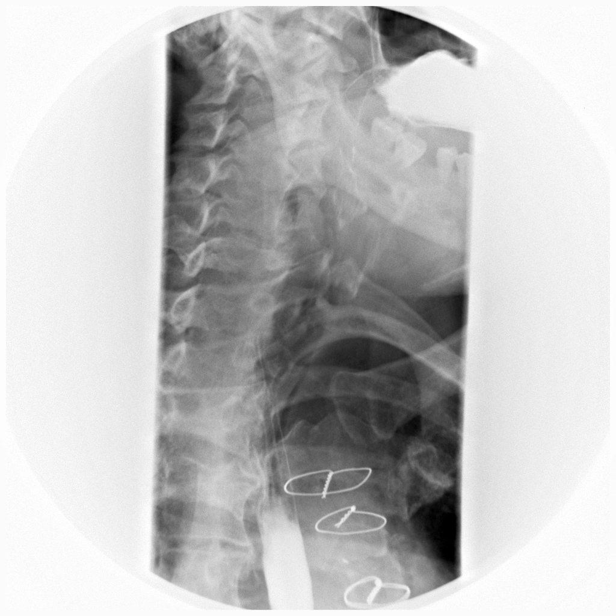
[im 4/25]
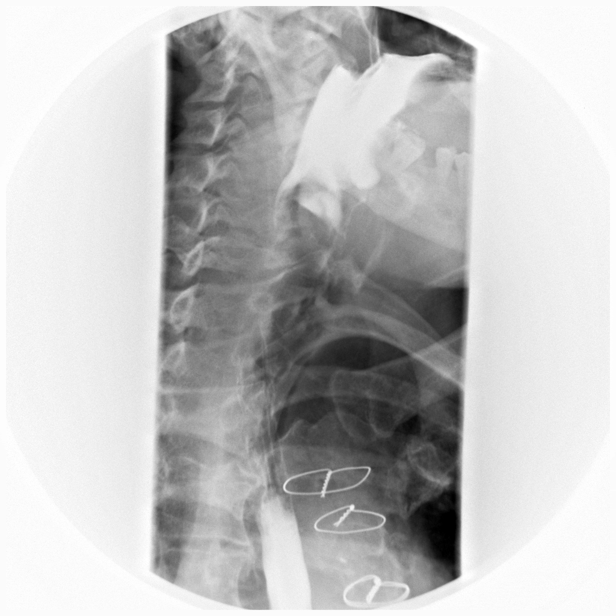
[im 7/25]
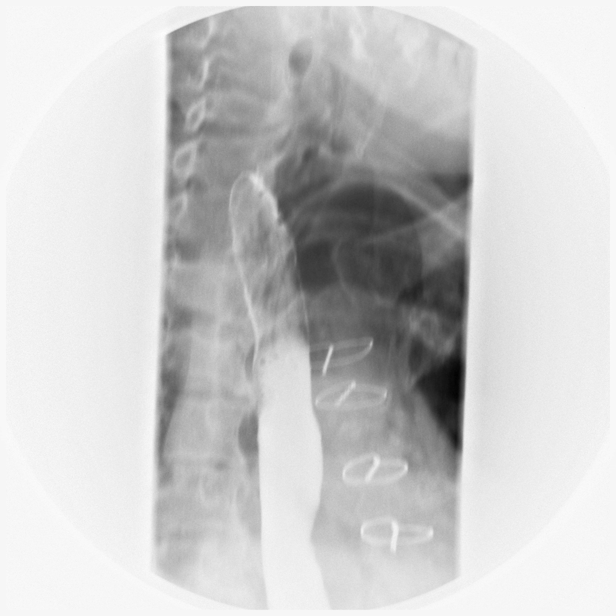
[im 11/25]
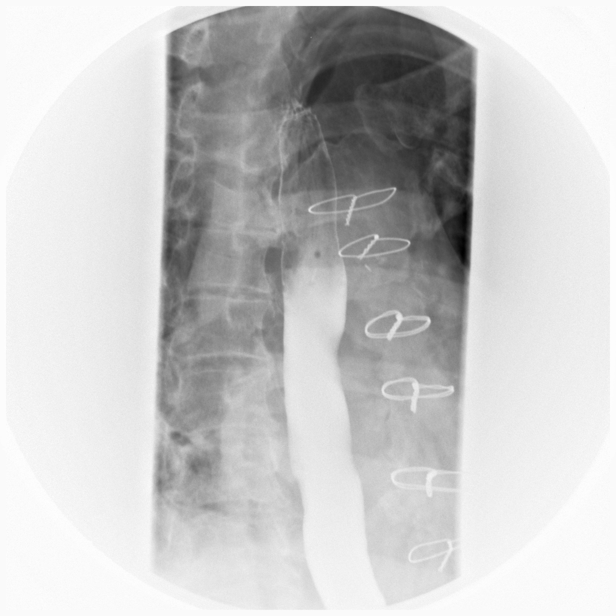
[im 13/25]
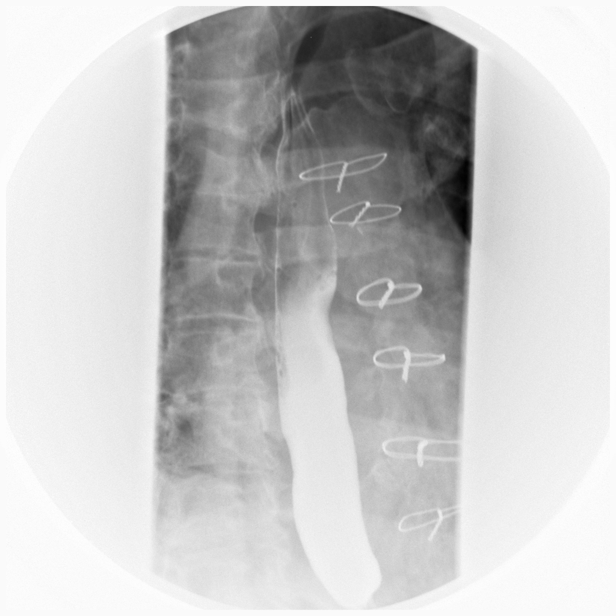
[im 16/25]
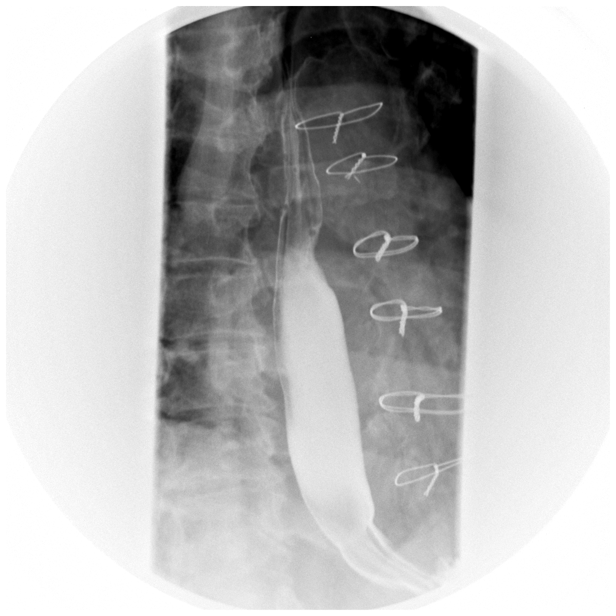
[im 18/25]
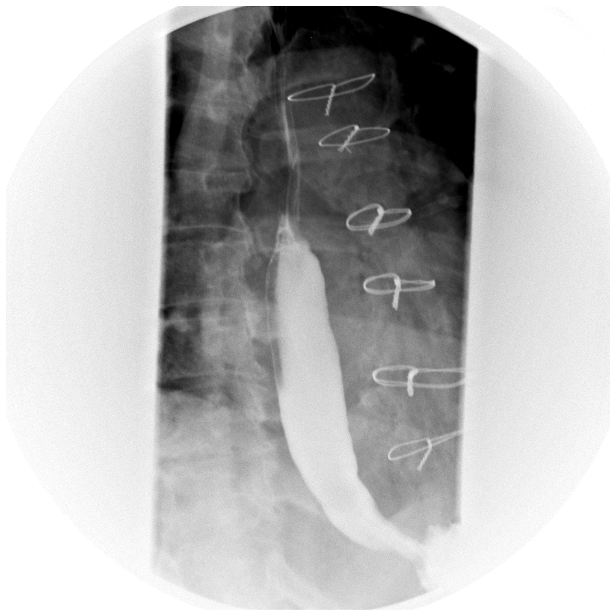
[im 21/25]
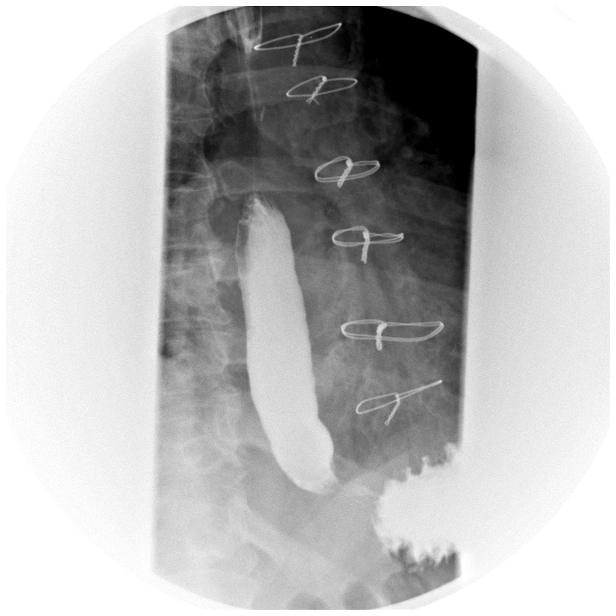
[im 25/25]
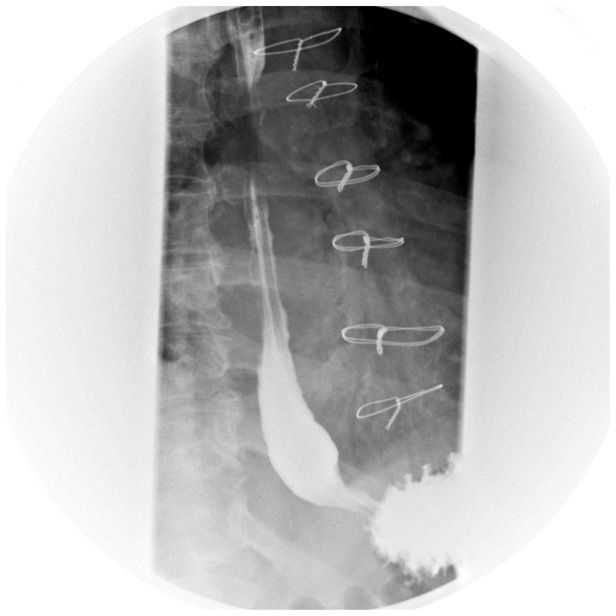

[Series 4: run · 1 of 1 slices shown (4 of 5)]
[im 1/1]
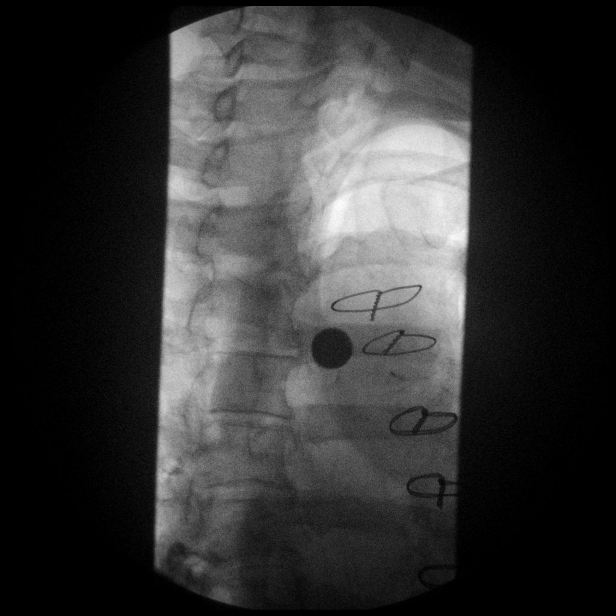

[Series 6: run · 1 of 1 slices shown (5 of 5)]
[im 1/1]
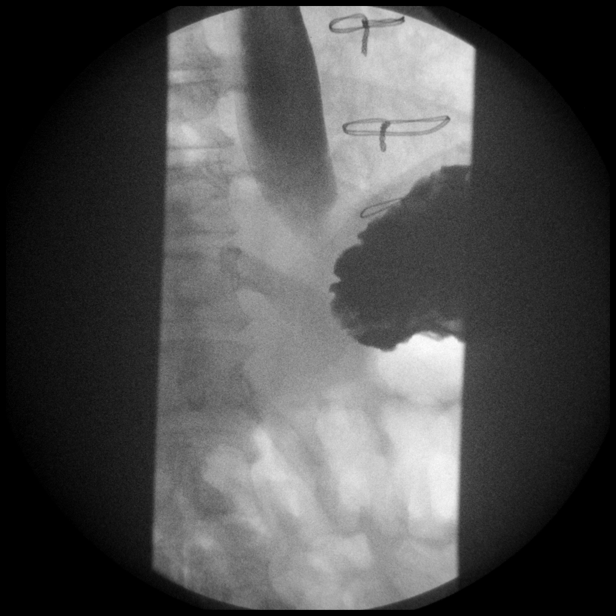

[15 of 24 positions shown; findings below may reference images not displayed]

FINDINGS: The oropharyngeal swallowing mechanisms are normal. There is no
aspiration or laryngeal penetration.

The mucosa and motility of the esophagus are normal. There is no
visible hiatal hernia or gastroesophageal reflux. A 13 mm barium
tablet passed from the mouth to the stomach with no significant
delay.
IMPRESSION: Normal barium esophagram.

## 2014-07-16 ENCOUNTER — Encounter (HOSPITAL_BASED_OUTPATIENT_CLINIC_OR_DEPARTMENT_OTHER): Payer: Self-pay | Admitting: *Deleted

## 2014-07-16 NOTE — Progress Notes (Signed)
   07/16/14 1250  OBSTRUCTIVE SLEEP APNEA  Have you ever been diagnosed with sleep apnea through a sleep study? No  Do you snore loudly (loud enough to be heard through closed doors)?  1  Do you often feel tired, fatigued, or sleepy during the daytime? 0  Has anyone observed you stop breathing during your sleep? 0  Do you have, or are you being treated for high blood pressure? 1  BMI more than 35 kg/m2? 0  Age over 53 years old? 1  Neck circumference greater than 40 cm/16 inches? 1  Gender: 1

## 2014-07-16 NOTE — Progress Notes (Signed)
Pt to come in for labs-hx cad-saw dr Mare Ferrari 1/16 pcp 2/16-stable Ef 30% Reviewed with dr hodi-ok for dsc

## 2014-07-19 ENCOUNTER — Encounter (HOSPITAL_BASED_OUTPATIENT_CLINIC_OR_DEPARTMENT_OTHER)
Admission: RE | Admit: 2014-07-19 | Discharge: 2014-07-19 | Disposition: A | Discharge status: Home / Self Care | Payer: Managed Care, Other (non HMO) | Source: Ambulatory Visit | Attending: Surgery | Admitting: Surgery

## 2014-07-19 DIAGNOSIS — D649 Anemia, unspecified: Secondary | ICD-10-CM | POA: Diagnosis not present

## 2014-07-19 DIAGNOSIS — I2510 Atherosclerotic heart disease of native coronary artery without angina pectoris: Secondary | ICD-10-CM | POA: Diagnosis not present

## 2014-07-19 DIAGNOSIS — I420 Dilated cardiomyopathy: Secondary | ICD-10-CM | POA: Diagnosis not present

## 2014-07-19 DIAGNOSIS — Z8673 Personal history of transient ischemic attack (TIA), and cerebral infarction without residual deficits: Secondary | ICD-10-CM | POA: Diagnosis not present

## 2014-07-19 DIAGNOSIS — Z951 Presence of aortocoronary bypass graft: Secondary | ICD-10-CM | POA: Diagnosis not present

## 2014-07-19 DIAGNOSIS — Z87891 Personal history of nicotine dependence: Secondary | ICD-10-CM | POA: Diagnosis not present

## 2014-07-19 DIAGNOSIS — I509 Heart failure, unspecified: Secondary | ICD-10-CM | POA: Diagnosis not present

## 2014-07-19 DIAGNOSIS — I252 Old myocardial infarction: Secondary | ICD-10-CM | POA: Diagnosis not present

## 2014-07-19 DIAGNOSIS — D170 Benign lipomatous neoplasm of skin and subcutaneous tissue of head, face and neck: Secondary | ICD-10-CM | POA: Diagnosis present

## 2014-07-19 LAB — COMPREHENSIVE METABOLIC PANEL
ALBUMIN: 3.8 g/dL (ref 3.5–5.2)
ALK PHOS: 148 U/L — AB (ref 39–117)
ALT: 27 U/L (ref 0–53)
AST: 27 U/L (ref 0–37)
Anion gap: 9 (ref 5–15)
BILIRUBIN TOTAL: 1.1 mg/dL (ref 0.3–1.2)
BUN: 11 mg/dL (ref 6–23)
CHLORIDE: 108 mmol/L (ref 96–112)
CO2: 22 mmol/L (ref 19–32)
Calcium: 9.4 mg/dL (ref 8.4–10.5)
Creatinine, Ser: 0.84 mg/dL (ref 0.50–1.35)
GFR calc Af Amer: >90 mL/min (ref >90)
GFR calc non Af Amer: >90 mL/min (ref >90)
Glucose, Bld: 99 mg/dL (ref 70–99)
Potassium: 4.4 mmol/L (ref 3.5–5.1)
Sodium: 139 mmol/L (ref 135–145)
TOTAL PROTEIN: 7.1 g/dL (ref 6.0–8.3)

## 2014-07-19 LAB — CBC WITH DIFFERENTIAL/PLATELET
Basophils Absolute: 0 10*3/uL (ref 0.0–0.1)
Basophils Relative: 1 % (ref 0–1)
Eosinophils Absolute: 0.1 10*3/uL (ref 0.0–0.7)
Eosinophils Relative: 1 % (ref 0–5)
HEMATOCRIT: 40.7 % (ref 39.0–52.0)
Hemoglobin: 13.8 g/dL (ref 13.0–17.0)
LYMPHS ABS: 2 10*3/uL (ref 0.7–4.0)
LYMPHS PCT: 47 % — AB (ref 12–46)
MCH: 28.6 pg (ref 26.0–34.0)
MCHC: 33.9 g/dL (ref 30.0–36.0)
MCV: 84.4 fL (ref 78.0–100.0)
MONO ABS: 0.6 10*3/uL (ref 0.1–1.0)
Monocytes Relative: 14 % — ABNORMAL HIGH (ref 3–12)
Neutro Abs: 1.6 10*3/uL — ABNORMAL LOW (ref 1.7–7.7)
Neutrophils Relative %: 37 % — ABNORMAL LOW (ref 43–77)
Platelets: 168 10*3/uL (ref 150–400)
RBC: 4.82 MIL/uL (ref 4.22–5.81)
RDW: 14.4 % (ref 11.5–15.5)
WBC: 4.2 10*3/uL (ref 4.0–10.5)

## 2014-07-21 ENCOUNTER — Encounter (HOSPITAL_BASED_OUTPATIENT_CLINIC_OR_DEPARTMENT_OTHER): Payer: Self-pay | Admitting: Certified Registered"

## 2014-07-21 ENCOUNTER — Ambulatory Visit (HOSPITAL_BASED_OUTPATIENT_CLINIC_OR_DEPARTMENT_OTHER)
Admission: RE | Admit: 2014-07-21 | Discharge: 2014-07-21 | Disposition: A | Discharge status: Home / Self Care | Payer: Managed Care, Other (non HMO) | Source: Ambulatory Visit | Attending: Surgery | Admitting: Surgery

## 2014-07-21 ENCOUNTER — Ambulatory Visit (HOSPITAL_BASED_OUTPATIENT_CLINIC_OR_DEPARTMENT_OTHER): Payer: Managed Care, Other (non HMO) | Admitting: Certified Registered"

## 2014-07-21 ENCOUNTER — Encounter (HOSPITAL_BASED_OUTPATIENT_CLINIC_OR_DEPARTMENT_OTHER)
Admission: RE | Disposition: A | Discharge status: Home / Self Care | Payer: Self-pay | Source: Ambulatory Visit | Attending: Surgery

## 2014-07-21 DIAGNOSIS — I252 Old myocardial infarction: Secondary | ICD-10-CM | POA: Insufficient documentation

## 2014-07-21 DIAGNOSIS — Z87891 Personal history of nicotine dependence: Secondary | ICD-10-CM | POA: Insufficient documentation

## 2014-07-21 DIAGNOSIS — I509 Heart failure, unspecified: Secondary | ICD-10-CM | POA: Insufficient documentation

## 2014-07-21 DIAGNOSIS — D649 Anemia, unspecified: Secondary | ICD-10-CM | POA: Insufficient documentation

## 2014-07-21 DIAGNOSIS — I420 Dilated cardiomyopathy: Secondary | ICD-10-CM | POA: Insufficient documentation

## 2014-07-21 DIAGNOSIS — D170 Benign lipomatous neoplasm of skin and subcutaneous tissue of head, face and neck: Secondary | ICD-10-CM | POA: Diagnosis not present

## 2014-07-21 DIAGNOSIS — Z8673 Personal history of transient ischemic attack (TIA), and cerebral infarction without residual deficits: Secondary | ICD-10-CM | POA: Insufficient documentation

## 2014-07-21 DIAGNOSIS — Z951 Presence of aortocoronary bypass graft: Secondary | ICD-10-CM | POA: Insufficient documentation

## 2014-07-21 DIAGNOSIS — I2510 Atherosclerotic heart disease of native coronary artery without angina pectoris: Secondary | ICD-10-CM | POA: Insufficient documentation

## 2014-07-21 HISTORY — DX: Presence of dental prosthetic device (complete) (partial): Z97.2

## 2014-07-21 HISTORY — PX: MASS EXCISION: SHX2000

## 2014-07-21 SURGERY — EXCISION MASS
Anesthesia: Monitor Anesthesia Care | Site: Neck | Laterality: Right

## 2014-07-21 MED ORDER — OXYCODONE HCL 5 MG PO TABS: 5.0000 mg | ORAL_TABLET | Freq: Once | ORAL | Status: DC | PRN

## 2014-07-21 MED ORDER — LIDOCAINE HCL (CARDIAC) 20 MG/ML IV SOLN
INTRAVENOUS | Status: DC | PRN
  Administered 2014-07-21: 60 mg via INTRAVENOUS

## 2014-07-21 MED ORDER — BUPIVACAINE-EPINEPHRINE 0.25% -1:200000 IJ SOLN
INTRAMUSCULAR | Status: DC | PRN
  Administered 2014-07-21: 10 mL

## 2014-07-21 MED ORDER — CEFAZOLIN SODIUM-DEXTROSE 2-3 GM-% IV SOLR
2.0000 g | INTRAVENOUS | Status: AC
  Administered 2014-07-21: 2 g via INTRAVENOUS

## 2014-07-21 MED ORDER — FENTANYL CITRATE 0.05 MG/ML IJ SOLN: 50.0000 ug | INTRAMUSCULAR | Status: DC | PRN

## 2014-07-21 MED ORDER — CEFAZOLIN SODIUM-DEXTROSE 2-3 GM-% IV SOLR
INTRAVENOUS | Status: AC
  Filled 2014-07-21: qty 50

## 2014-07-21 MED ORDER — HYDROMORPHONE HCL 1 MG/ML IJ SOLN: 0.2500 mg | INTRAMUSCULAR | Status: DC | PRN

## 2014-07-21 MED ORDER — MIDAZOLAM HCL 2 MG/2ML IJ SOLN
INTRAMUSCULAR | Status: AC
Start: 2014-07-21 — End: 2014-07-21
  Filled 2014-07-21: qty 2

## 2014-07-21 MED ORDER — OXYCODONE HCL 5 MG/5ML PO SOLN: 5.0000 mg | Freq: Once | ORAL | Status: DC | PRN

## 2014-07-21 MED ORDER — ONDANSETRON HCL 4 MG/2ML IJ SOLN
INTRAMUSCULAR | Status: DC | PRN
  Administered 2014-07-21: 4 mg via INTRAVENOUS

## 2014-07-21 MED ORDER — FENTANYL CITRATE 0.05 MG/ML IJ SOLN
INTRAMUSCULAR | Status: AC
  Filled 2014-07-21: qty 2

## 2014-07-21 MED ORDER — MIDAZOLAM HCL 2 MG/2ML IJ SOLN
1.0000 mg | INTRAMUSCULAR | Status: DC | PRN
Start: 2014-07-21 — End: 2014-07-21

## 2014-07-21 MED ORDER — PROPOFOL INFUSION 10 MG/ML OPTIME
INTRAVENOUS | Status: DC | PRN
  Administered 2014-07-21: 75 ug/kg/min via INTRAVENOUS

## 2014-07-21 MED ORDER — HYDROCODONE-ACETAMINOPHEN 5-325 MG PO TABS: 1.0000 | ORAL_TABLET | Freq: Four times a day (QID) | ORAL | Status: DC | PRN

## 2014-07-21 MED ORDER — PROMETHAZINE HCL 25 MG/ML IJ SOLN: 6.2500 mg | INTRAMUSCULAR | Status: DC | PRN

## 2014-07-21 MED ORDER — FENTANYL CITRATE 0.05 MG/ML IJ SOLN
INTRAMUSCULAR | Status: DC | PRN
  Administered 2014-07-21: 50 ug via INTRAVENOUS

## 2014-07-21 MED ORDER — MIDAZOLAM HCL 5 MG/5ML IJ SOLN
INTRAMUSCULAR | Status: DC | PRN
  Administered 2014-07-21: 2 mg via INTRAVENOUS

## 2014-07-21 MED ORDER — CHLORHEXIDINE GLUCONATE 4 % EX LIQD: 1.0000 "application " | Freq: Once | CUTANEOUS | Status: DC

## 2014-07-21 MED ORDER — LACTATED RINGERS IV SOLN
INTRAVENOUS | Status: DC
  Administered 2014-07-21: 11:00:00 via INTRAVENOUS

## 2014-07-21 SURGICAL SUPPLY — 39 items
APL SKNCLS STERI-STRIP NONHPOA (GAUZE/BANDAGES/DRESSINGS)
BENZOIN TINCTURE PRP APPL 2/3 (GAUZE/BANDAGES/DRESSINGS) IMPLANT
BLADE SURG 10 STRL SS (BLADE) IMPLANT
BLADE SURG 15 STRL LF DISP TIS (BLADE) ×1 IMPLANT
BLADE SURG 15 STRL SS (BLADE) ×2
CANISTER SUCT 1200ML W/VALVE (MISCELLANEOUS) IMPLANT
CHLORAPREP W/TINT 26ML (MISCELLANEOUS) ×2 IMPLANT
COVER BACK TABLE 60X90IN (DRAPES) ×2 IMPLANT
COVER MAYO STAND STRL (DRAPES) ×2 IMPLANT
DECANTER SPIKE VIAL GLASS SM (MISCELLANEOUS) IMPLANT
DRAPE LAPAROTOMY 100X72 PEDS (DRAPES) ×2 IMPLANT
DRAPE UTILITY XL STRL (DRAPES) ×2 IMPLANT
ELECT COATED BLADE 2.86 ST (ELECTRODE) ×2 IMPLANT
ELECT REM PT RETURN 9FT ADLT (ELECTROSURGICAL) ×2
ELECTRODE REM PT RTRN 9FT ADLT (ELECTROSURGICAL) ×1 IMPLANT
GLOVE BIOGEL PI IND STRL 8 (GLOVE) ×1 IMPLANT
GLOVE BIOGEL PI INDICATOR 8 (GLOVE) ×1
GLOVE ECLIPSE 8.0 STRL XLNG CF (GLOVE) ×2 IMPLANT
GOWN STRL REUS W/ TWL LRG LVL3 (GOWN DISPOSABLE) ×2 IMPLANT
GOWN STRL REUS W/TWL LRG LVL3 (GOWN DISPOSABLE) ×4
LIQUID BAND (GAUZE/BANDAGES/DRESSINGS) IMPLANT
NDL HYPO 25X1 1.5 SAFETY (NEEDLE) ×1 IMPLANT
NEEDLE HYPO 25X1 1.5 SAFETY (NEEDLE) ×2 IMPLANT
NS IRRIG 1000ML POUR BTL (IV SOLUTION) IMPLANT
PACK BASIN DAY SURGERY FS (CUSTOM PROCEDURE TRAY) ×2 IMPLANT
PENCIL BUTTON HOLSTER BLD 10FT (ELECTRODE) ×2 IMPLANT
SLEEVE SCD COMPRESS KNEE MED (MISCELLANEOUS) ×2 IMPLANT
SPONGE LAP 4X18 X RAY DECT (DISPOSABLE) IMPLANT
STAPLER VISISTAT 35W (STAPLE) IMPLANT
STRIP CLOSURE SKIN 1/2X4 (GAUZE/BANDAGES/DRESSINGS) IMPLANT
SUT MON AB 4-0 PC3 18 (SUTURE) ×2 IMPLANT
SUT VIC AB 3-0 SH 27 (SUTURE) ×2
SUT VIC AB 3-0 SH 27X BRD (SUTURE) ×1 IMPLANT
SUT VICRYL AB 3 0 TIES (SUTURE) IMPLANT
SYR CONTROL 10ML LL (SYRINGE) ×2 IMPLANT
TOWEL OR 17X24 6PK STRL BLUE (TOWEL DISPOSABLE) ×4 IMPLANT
TOWEL OR NON WOVEN STRL DISP B (DISPOSABLE) ×2 IMPLANT
TUBE CONNECTING 20X1/4 (TUBING) IMPLANT
YANKAUER SUCT BULB TIP NO VENT (SUCTIONS) IMPLANT

## 2014-07-21 NOTE — Interval H&P Note (Signed)
History and Physical Interval Note:  07/21/2014 10:29 AM  [name]  has presented today for surgery, with the diagnosis of Lipoma Right Neck  The various methods of treatment have been discussed with the patient and family. After consideration of risks, benefits and other options for treatment, the patient has consented to  Procedure(s): EXCISION RIGHT NECK MASS (Right) as a surgical intervention .  The patient's history has been reviewed, patient examined, no change in status, stable for surgery.  I have reviewed the patient's chart and labs.  Questions were answered to the patient's satisfaction.     [name] A.

## 2014-07-21 NOTE — Op Note (Signed)
Preoperative diagnosis: 3 cm lipoma right neck superficial  Postoperative diagnosis: Same  Procedure: Excision of 3 cm right neck superficial lipoma  Surgeon: Erroll Luna M.D.  Anesthesia: Mac with 0.25% Sensorcaine local with epinephrine  EBL minimal  Specimen: Lipoma  Indications for procedure patient is a 54 year old male with a lipoma of his right neck has been getting larger. He wishes to have it removed.The procedure has been discussed with the patient.  Alternative therapies have been discussed with the patient.  Operative risks include bleeding,  Infection,  Organ injury,  Nerve injury,  Blood vessel injury,  DVT,  Pulmonary embolism,  Death,  And possible reoperation.  Medical management risks include worsening of present situation.  The success of the procedure is 50 -90 % at treating patients symptoms.  The patient understands and agrees to proceed.  Description of procedure: Patient brought to operating room after being seen in the holding area. The area was marked with help with the patient. He was placed supine with the right arm tucked in the operating room. Right neck was prepped and draped in sterile fashion after appropriate level of Mac anesthesia was initiated. Timeout was done to verify proper patient and procedure. Incision made over mass and fatty tumor encountered the superficial tissues. This was removed in a piecemeal fashion. Hemostasis achieved and wound closed with 3-0 Vicryl and 4-0 Monocryl. Liquid adhesive applied. All final counts found to be correct. Patient awoke taken to recovery in satisfactory condition.

## 2014-07-21 NOTE — Discharge Instructions (Signed)
GENERAL SURGERY: POST OP INSTRUCTIONS ° °1. DIET: Follow a light bland diet the first 24 hours after arrival home, such as soup, liquids, crackers, etc.  Be sure to include lots of fluids daily.  Avoid fast food or heavy meals as your are more likely to get nauseated.   °2. Take your usually prescribed home medications unless otherwise directed. °3. PAIN CONTROL: °a. Pain is best controlled by a usual combination of three different methods TOGETHER: °i. Ice/Heat °ii. Over the counter pain medication °iii. Prescription pain medication °b. Most patients will experience some swelling and bruising around the incisions.  Ice packs or heating pads (30-60 minutes up to 6 times a day) will help. Use ice for the first few days to help decrease swelling and bruising, then switch to heat to help relax tight/sore spots and speed recovery.  Some people prefer to use ice alone, heat alone, alternating between ice & heat.  Experiment to what works for you.  Swelling and bruising can take several weeks to resolve.   °c. It is helpful to take an over-the-counter pain medication regularly for the first few weeks.  Choose one of the following that works best for you: °i. Naproxen (Aleve, etc)  Two 220mg tabs twice a day °ii. Ibuprofen (Advil, etc) Three 200mg tabs four times a day (every meal & bedtime) °iii. Acetaminophen (Tylenol, etc) 500-650mg four times a day (every meal & bedtime) °d. A  prescription for pain medication (such as oxycodone, hydrocodone, etc) should be given to you upon discharge.  Take your pain medication as prescribed.  °i. If you are having problems/concerns with the prescription medicine (does not control pain, nausea, vomiting, rash, itching, etc), please call us [phone] to see if we need to switch you to a different pain medicine that will work better for you and/or control your side effect better. °ii. If you need a refill on your pain medication, please contact your pharmacy.  They will contact our  office to request authorization. Prescriptions will not be filled after 5 pm or on week-ends. °4. Avoid getting constipated.  Between the surgery and the pain medications, it is common to experience some constipation.  Increasing fluid intake and taking a fiber supplement (such as Metamucil, Citrucel, FiberCon, MiraLax, etc) 1-2 times a day regularly will usually help prevent this problem from occurring.  A mild laxative (prune juice, Milk of Magnesia, MiraLax, etc) should be taken according to package directions if there are no bowel movements after 48 hours.   °5. Wash / shower every day.  You may shower over the dressings as they are waterproof.  Continue to shower over incision(s) after the dressing is off. °6. Remove your waterproof bandages 5 days after surgery.  You may leave the incision open to air.  You may have skin tapes (Steri Strips) covering the incision(s).  Leave them on until one week, then remove.  You may replace a dressing/Band-Aid to cover the incision for comfort if you wish.  ° ° ° ° °7. ACTIVITIES as tolerated:   °a. You may resume regular (light) daily activities beginning the next day--such as daily self-care, walking, climbing stairs--gradually increasing activities as tolerated.  If you can walk 30 minutes without difficulty, it is safe to try more intense activity such as jogging, treadmill, bicycling, low-impact aerobics, swimming, etc. °b. Save the most intensive and strenuous activity for last such as sit-ups, heavy lifting, contact sports, etc  Refrain from any heavy lifting or straining until you   are off narcotics for pain control.   c. DO NOT PUSH THROUGH PAIN.  Let pain be your guide: If it hurts to do something, don't do it.  Pain is your body warning you to avoid that activity for another week until the pain goes down. d. You may drive when you are no longer taking prescription pain medication, you can comfortably wear a seatbelt, and you can safely maneuver your car and  apply brakes. e. [name] may have sexual intercourse when it is comfortable.  8. FOLLOW UP in our office a. Please call [facility] at [phone] to set up an appointment to see your surgeon in the office for a follow-up appointment approximately 2-3 weeks after your surgery. b. Make sure that you call for this appointment the day you arrive home to insure a convenient appointment time. 9. IF YOU HAVE DISABILITY OR FAMILY LEAVE FORMS, BRING THEM TO THE OFFICE FOR PROCESSING.  DO NOT GIVE THEM TO YOUR DOCTOR.   WHEN TO CALL us [phone]: 1. Poor pain control 2. Reactions / problems with new medications (rash/itching, nausea, etc)  3. Fever over 101.5 F (38.5 C) 4. Worsening swelling or bruising 5. Continued bleeding from incision. 6. Increased pain, redness, or drainage from the incision 7. Difficulty breathing / swallowing   The clinic staff is available to answer your questions during regular business hours (8:30am-5pm).  Please dont hesitate to call and ask to speak to one of our nurses for clinical concerns.   If you have a medical emergency, go to the nearest emergency room or call 911.  A surgeon from [facility] is always on call at the [facility], [place], [place], [place], [place]  [id number] ? MAIN: [phone] ? TOLL FREE: [phone] ?  FAX [phone] [email or web address]     Restart  plavix in 48 hours    Post Anesthesia Home Care Instructions  Activity: Get plenty of rest for the remainder of the day. A responsible adult should stay with you for 24 hours following the procedure.  For the next 24 hours, DO NOT: -Drive a car -Paediatric nurse -Drink alcoholic beverages -Take any medication unless instructed by your physician -Make any legal decisions or sign important papers.  Meals: Start with liquid foods such as gelatin or soup. Progress to regular foods as tolerated. Avoid greasy,  spicy, heavy foods. If nausea and/or vomiting occur, drink only clear liquids until the nausea and/or vomiting subsides. Call your physician if vomiting continues.  Special Instructions/Symptoms: Your throat may feel dry or sore from the anesthesia or the breathing tube placed in your throat during surgery. If this causes discomfort, gargle with warm salt water. The discomfort should disappear within 24 hours.  If you had a scopolamine patch placed behind your ear for the management of post- operative nausea and/or vomiting:  1. The medication in the patch is effective for 72 hours, after which it should be removed.  Wrap patch in a tissue and discard in the trash. Wash hands thoroughly with soap and water. 2. You may remove the patch earlier than 72 hours if you experience unpleasant side effects which may include dry mouth, dizziness or visual disturbances. 3. Avoid touching the patch. Wash your hands with soap and water after contact with the patch.

## 2014-07-21 NOTE — Anesthesia Preprocedure Evaluation (Addendum)
Anesthesia Evaluation  Patient identified by MRN, date of birth, ID band Patient awake    Reviewed: Allergy & Precautions, NPO status   Airway Mallampati: I  TM Distance: >3 FB Neck ROM: Full    Dental  (+) Poor Dentition   Pulmonary former smoker,  breath sounds clear to auscultation        Cardiovascular + CAD and + Past MI Rhythm:Regular Rate:Normal  CABG 10/14, cardiomyopathy,echo 08/15 EF 30%, right atrium severely dilated    Neuro/Psych CVA    GI/Hepatic Neg liver ROS,   Endo/Other  negative endocrine ROS  Renal/GU negative Renal ROS     Musculoskeletal   Abdominal   Peds  Hematology  (+) anemia ,   Anesthesia Other Findings   Reproductive/Obstetrics                           Anesthesia Physical Anesthesia Plan  ASA: III  Anesthesia Plan: MAC   Post-op Pain Management:    Induction: Intravenous  Airway Management Planned: Simple Face Mask  Additional Equipment:   Intra-op Plan:   Post-operative Plan:   Informed Consent: I have reviewed the patients History and Physical, chart, labs and discussed the procedure including the risks, benefits and alternatives for the proposed anesthesia with the patient or authorized representative who has indicated his/her understanding and acceptance.     Plan Discussed with: CRNA and Surgeon  Anesthesia Plan Comments:         Anesthesia Quick Evaluation

## 2014-07-21 NOTE — Anesthesia Postprocedure Evaluation (Signed)
  Anesthesia Post-op Note  Patient: [name]  Procedure(s) Performed: Procedure(s): EXCISION RIGHT NECK MASS (Right)  Patient Location: PACU  Anesthesia Type:MAC  Level of Consciousness: awake and alert   Airway and Oxygen Therapy: Patient Spontanous Breathing  Post-op Pain: mild  Post-op Assessment: Post-op Vital signs reviewed  Post-op Vital Signs: stable  Last Vitals:  Filed Vitals:   07/21/14 1415  BP: 132/84  Pulse: 71  Temp: 36.5 C  Resp: 20    Complications: No apparent anesthesia complications

## 2014-07-21 NOTE — H&P (Signed)
H&P   [name] (MR# [id number])      H&P Info    Author Note Status Last Update User Last Update Date/Time   Robert Luna, MD Signed Robert Luna, MD 06/07/2014 3:38 PM    H&P    Expand All Collapse All   [name] 06/07/2014 3:15 PM Location: [facility] Patient #: [id number] DOB: 1960-05-17 Single / Language: Cleophus Tucker / Race: Black or African American Male History of Present Illness Robert Moores A. Antonya Leeder MD; 06/07/2014 3:34 PM) Patient words: lipoma neck  pt sent at the request of [name] for mass right neck. Soft non tender and mobile slowly growing. not causing any pain. Would like it removed.  The patient is a 54 year old male who presents with a complaint of mass. The onset of the mass has been gradual. The course has been increasing. The mass is described as mild. Other Problems [name], [name]; 06/07/2014 3:15 PM) Cerebrovascular Accident Congestive Heart Failure  Past Surgical History [name], CMA; 06/07/2014 3:15 PM) No pertinent past surgical history  Allergies [name], CMA; 06/07/2014 3:16 PM) No Known Drug Allergies 06/07/2014  Medication History ([name], CMA; 06/07/2014 3:17 PM) Carvedilol (25MG  Tablet, Oral) Active. Clopidogrel Bisulfate (75MG  Tablet, Oral) Active. Lisinopril (10MG  Tablet, Oral) Active. Simvastatin (40MG  Tablet, Oral) Active. Medications Reconciled  Social History [name], CMA; 06/07/2014 3:15 PM) No caffeine use  Family History [name], CMA; 06/07/2014 3:15 PM) Heart disease in male family member before age 98 Hypertension Mother.     Review of Systems [name] CMA; 06/07/2014 3:15 PM) General Not Present- Appetite Loss, Chills, Fatigue, Fever, Night Sweats, Weight Gain and Weight Loss. Skin Present- New Lesions. Not Present- Change in Wart/Mole, Dryness, Hives, Jaundice, Non-Healing Wounds, Rash and Ulcer. HEENT Present- Wears glasses/contact lenses. Not  Present- Earache, Hearing Loss, Hoarseness, Nose Bleed, Oral Ulcers, Ringing in the Ears, Seasonal Allergies, Sinus Pain, Sore Throat, Visual Disturbances and Yellow Eyes. Respiratory Not Present- Bloody sputum, Chronic Cough, Difficulty Breathing, Snoring and Wheezing. Cardiovascular Not Present- Chest Pain, Difficulty Breathing Lying Down, Leg Cramps, Palpitations, Rapid Heart Rate, Shortness of Breath and Swelling of Extremities. Male Genitourinary Present- Impotence. Not Present- Blood in Urine, Change in Urinary Stream, Frequency, Nocturia, Painful Urination, Urgency and Urine Leakage. Psychiatric Not Present- Anxiety, Bipolar, Change in Sleep Pattern, Depression, Fearful and Frequent crying.  Vitals ([name] CMA; 06/07/2014 3:16 PM) 06/07/2014 3:16 PM Weight: 228 lb Height: 73in Body Surface Area: 2.31 m Body Mass Index: 30.08 kg/m Temp.: 97.66F(Temporal)  Pulse: 81 (Regular)  BP: 130/76 (Sitting, Left Arm, Standard)     Physical Exam (Robert Pucillo A. Savhanna Sliva MD; 06/07/2014 3:34 PM)  General Mental Status-Alert. General Appearance-Consistent with stated age. Hydration-Well hydrated. Voice-Normal.  Head and Neck Note: 4 cm mobile mass right neck lobulated and subcutaneous lipoma   Chest and Lung Exam Chest and lung exam reveals -quiet, even and easy respiratory effort with no use of accessory muscles and on auscultation, normal breath sounds, no adventitious sounds and normal vocal resonance. Inspection Chest Wall - Normal. Back - normal.  Cardiovascular Cardiovascular examination reveals -on palpation PMI is normal in location and amplitude, no palpable S3 or S4. Normal cardiac borders., normal heart sounds, regular rate and rhythm with no murmurs, carotid auscultation reveals no bruits and normal pedal pulses bilaterally.  Neurologic Neurologic evaluation reveals -alert and oriented x 3 with no impairment of recent or remote memory. Mental  Status-Normal.  Musculoskeletal Normal Exam - Left-Upper Extremity Strength  Normal and Lower Extremity Strength Normal. Normal Exam - Right-Upper Extremity Strength Normal, Lower Extremity Weakness.    Assessment & Plan (Robert Kyser A. Akeria Hedstrom MD; 06/07/2014 3:38 PM)  LIPOMA OF SKIN AND SUBCUTANEOUS TISSUE OF NECK (214.1  D17.0) Impression: pt desires removal. Risk of bleeding, infection, recurrence and worsening of underlying medical problems. He agrees to proceed.  Current Plans Pt Education - [facility] Pain Control ([name]) Pt Education - [facility] Wound Care Instructions ([name]): discussed with patient and provided information.

## 2014-07-21 NOTE — Transfer of Care (Signed)
Immediate Anesthesia Transfer of Care Note  Patient: [name]  Procedure(s) Performed: Procedure(s): EXCISION RIGHT NECK MASS (Right)  Patient Location: PACU  Anesthesia Type:MAC  Level of Consciousness: awake, alert , oriented and patient cooperative  Airway & Oxygen Therapy: Patient Spontanous Breathing and Patient connected to face mask oxygen  Post-op Assessment: Report given to RN and Post -op Vital signs reviewed and stable  Post vital signs: Reviewed and stable  Last Vitals:  Filed Vitals:   07/21/14 1305  BP:   Pulse: 67  Temp:   Resp: 14    Complications: No apparent anesthesia complications

## 2014-07-21 NOTE — Anesthesia Procedure Notes (Signed)
Procedure Name: MAC Date/Time: 07/21/2014 12:30 PM Performed by: Zebastian Carico D Pre-anesthesia Checklist: Patient identified, Emergency Drugs available, Suction available, Patient being monitored and Timeout performed Patient Re-evaluated:Patient Re-evaluated prior to inductionOxygen Delivery Method: Simple face mask

## 2014-07-22 ENCOUNTER — Encounter (HOSPITAL_BASED_OUTPATIENT_CLINIC_OR_DEPARTMENT_OTHER): Payer: Self-pay | Admitting: Surgery

## 2014-07-22 ENCOUNTER — Other Ambulatory Visit: Payer: Managed Care, Other (non HMO)

## 2014-07-22 ENCOUNTER — Ambulatory Visit: Payer: Managed Care, Other (non HMO) | Admitting: Cardiology

## 2014-08-04 ENCOUNTER — Encounter: Payer: Self-pay | Admitting: Cardiology

## 2014-08-24 ENCOUNTER — Other Ambulatory Visit: Payer: Self-pay | Admitting: *Deleted

## 2014-08-24 ENCOUNTER — Telehealth: Payer: Self-pay | Admitting: *Deleted

## 2014-08-24 DIAGNOSIS — Z23 Encounter for immunization: Secondary | ICD-10-CM

## 2014-08-24 MED ORDER — ZOSTER VACCINE LIVE 19400 UNT/0.65ML ~~LOC~~ SOLR: 0.6500 mL | Freq: Once | SUBCUTANEOUS | Status: DC

## 2014-08-24 NOTE — Telephone Encounter (Signed)
Placed order for shingles vaccine and am awaiting to see if Medicaid will pay for it.

## 2014-08-26 NOTE — Telephone Encounter (Signed)
Called to let patient know that medicaid would not authorize the shingles vaccination.  He is welcome to still do it but he will have to pay out of pocket and the vaccine is $203.60

## 2014-09-01 NOTE — Telephone Encounter (Signed)
Patients insurance will not cover Shingles vaccine-patient made aware he may pay out of pocket if he would like to proceed

## 2014-09-06 ENCOUNTER — Other Ambulatory Visit (INDEPENDENT_AMBULATORY_CARE_PROVIDER_SITE_OTHER): Payer: Managed Care, Other (non HMO) | Admitting: *Deleted

## 2014-09-06 DIAGNOSIS — E785 Hyperlipidemia, unspecified: Secondary | ICD-10-CM

## 2014-09-06 LAB — BASIC METABOLIC PANEL
BUN: 12 mg/dL (ref 6–23)
CHLORIDE: 105 meq/L (ref 96–112)
CO2: 25 mEq/L (ref 19–32)
Calcium: 9.4 mg/dL (ref 8.4–10.5)
Creatinine, Ser: 0.89 mg/dL (ref 0.40–1.50)
GFR: 114.93 mL/min (ref >60.00)
Glucose, Bld: 95 mg/dL (ref 70–99)
Potassium: 3.8 mEq/L (ref 3.5–5.1)
SODIUM: 138 meq/L (ref 135–145)

## 2014-09-06 LAB — LIPID PANEL
CHOLESTEROL: 166 mg/dL (ref 0–200)
HDL: 54.7 mg/dL (ref >39.00)
LDL CALC: 95 mg/dL (ref 0–99)
NONHDL: 111.3
Total CHOL/HDL Ratio: 3
Triglycerides: 80 mg/dL (ref 0.0–149.0)
VLDL: 16 mg/dL (ref 0.0–40.0)

## 2014-09-06 LAB — HEPATIC FUNCTION PANEL
ALBUMIN: 4.2 g/dL (ref 3.5–5.2)
ALT: 23 U/L (ref 0–53)
AST: 22 U/L (ref 0–37)
Alkaline Phosphatase: 134 U/L — ABNORMAL HIGH (ref 39–117)
BILIRUBIN DIRECT: 0.2 mg/dL (ref 0.0–0.3)
BILIRUBIN TOTAL: 0.7 mg/dL (ref 0.2–1.2)
TOTAL PROTEIN: 7.2 g/dL (ref 6.0–8.3)

## 2014-09-06 NOTE — Progress Notes (Signed)
Quick Note:  Please make copy of labs for patient visit. ______ 

## 2014-09-10 ENCOUNTER — Ambulatory Visit: Payer: Managed Care, Other (non HMO) | Admitting: Cardiology

## 2014-10-25 ENCOUNTER — Encounter: Payer: Self-pay | Admitting: Cardiology

## 2014-10-25 ENCOUNTER — Ambulatory Visit (INDEPENDENT_AMBULATORY_CARE_PROVIDER_SITE_OTHER): Payer: Managed Care, Other (non HMO) | Admitting: Cardiology

## 2014-10-25 VITALS — BP 110/80 | HR 79 | Ht 73.0 in | Wt 230.0 lb

## 2014-10-25 DIAGNOSIS — I259 Chronic ischemic heart disease, unspecified: Secondary | ICD-10-CM

## 2014-10-25 DIAGNOSIS — I429 Cardiomyopathy, unspecified: Secondary | ICD-10-CM | POA: Diagnosis not present

## 2014-10-25 DIAGNOSIS — I639 Cerebral infarction, unspecified: Secondary | ICD-10-CM | POA: Diagnosis not present

## 2014-10-25 NOTE — Patient Instructions (Addendum)
Medication Instructions:  Your physician recommends that you continue on your current medications as directed. Please refer to the Current Medication list given to you today.  Labwork: none  Testing/Procedures: Your physician has requested that you have an echocardiogram. Echocardiography is a painless test that uses sound waves to create images of your heart. It provides your doctor with information about the size and shape of your heart and how well your heart's chambers and valves are working. This procedure takes approximately one hour. There are no restrictions for this procedure. 6 months prior to ov  Follow-Up: Your physician wants you to follow-up in: 6 month ov You will receive a reminder letter in the mail two months in advance. If you don't receive a letter, please call our office to schedule the follow-up appointment.  WORK HARDER ON DIET AND WEIGHT LOSS

## 2014-10-25 NOTE — Progress Notes (Signed)
Cardiology Office Note   Date:  10/25/2014   ID:  [name], DOB Jul 13, 1960, MRN [id number]  PCP:  Angelica Chessman, MD  Cardiologist: Darlin Coco MD  Chief Complaint  Patient presents with  . Coronary Artery Disease     History of Present Illness: ELISEO WITHERS is a 54 y.o. male who presents for a six-month follow-up office visit.   He currently is feeling well. He was in his usual state of health until September 2014 when he presented with chest pain and was cathetered and showed a totally occluded right and a 50% in-stent restenosis of the circumflex stent. He had had previous stents to the LAD and circumflex in [place] in 2007. The patient was readmitted with recurrent anterolateral ST elevation chest pain in October 2014 and was found to have a large pericardial effusion with tamponade.. A pericardial drain was placed. He was almost ready for discharge when he developed signs of a stroke with left arm weakness and left visual field defect. He then developed hemodynamic collapse with hypotension and tachycardia and fever of 104 and required intubation. 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 Roxan Hockey. 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 Echo showed moderate pericardial effusion. He also had recurrent C. difficile colitis. His ejection fraction by echo was 25% and a sedimentation rate was elevated. He was discharged to skilled nursing facility and subsequently was able to return home.  He still has some residual weakness of his left arm but his range of  motion has improved significantly.  The patient had an echocardiogram on 11/26/13 showing ejection fraction 30%  The patient is not having any exertional dyspnea or chest pain.  He is not having any dizziness or syncope. His appetite is good and he has not been as careful with his diet and his weight is up 15 pounds since last visit.   Past Medical History  Diagnosis Date  . Coronary artery disease     a. s/p MI in 2009 in [place] with stenting of the LCX and LAD;  b. 12/2012 NSTEMI/CAD: LM nl, LAD patent prox stent, LCX 50-70 isr (FFR 0.84), RCA dom, 129m EF 40-45%-->Med Rx. c. 01/2013: anterolateral STEMI complicated by pericardial effusion (presumed purulent pericarditis) and tamponade s/p drain, ruptured LV pseudoaneurysm s/p CorMatrix patch, CABGx1 (SVG-OM1), fever, CVA, VDRF, C diff.  . Tobacco abuse     a. quit 12/2012.  . Cardiomyopathy     a. Sept/Oct 2014: EF ~40% (ICM). b. 03/2013: EF 25-30%. Off ACEI due to hypotension (MIXED NICM/ICM).  . Hyperlipidemia   . Pericardial effusion     a. 01/2013: pericardial effusion (presumed purulent pericarditis) and cardiac tamponade s/p drain. b. Persistent moderate pericardial effusion 03/2013.  . Cardiac tamponade     a. 01/2013 s/p drain.  . Pseudoaneurysm of left ventricle of heart     a. 01/2013:  Ruptured inferoposterior LV pseudoaneurysm s/p CorMatrix patch.  . CVA (cerebral infarction)     a. 01/2013 in setting of prolonged hospitalization - residual L arm weakness.  . C. difficile colitis     a. 01/2013 during prolonged adm. b. Recurred  03/2013.  Marland Kitchen Acute respiratory failure     a. 01/2013: VDRF. b. 03/2013: hypoxia requiring supp O2 during adm, resolved by discharge.  Marland Kitchen Hyponatremia     a. During late 2014.  . 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a. 03/2013: readm with hypoxia, tachycardia, elevated ESR/CRP, elevated troponin with 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and myopericarditis; treated with steroids.  . Acute myopericarditis     a. 03/2013: readm with  hypoxia, tachycardia, elevated ESR/CRP, elevated troponin with 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and myopericarditis; treated with steroids.  Marland Kitchen Dysphagia     a. 03/2013: felt possibly due to globus hystericus;barium swallow normal 03/2013, recent normal esophagram 01/2013. GI did not feel any further w/u needed at that time.  . Orthostatic hypotension     a. Profound during 03/2013 admission (BP dropping to 00'T systolic) requiring meds scaled back.  . Physical deconditioning   . Anemia due to acute blood loss     a. 01/2013 hospitalization (s/p surgery).  . Non-STEMI (non-ST elevated myocardial infarction)   . Vision loss of right eye   . Hypokalemia   . Stroke     weak lt side-lt arm  . Wears dentures     top    Past Surgical History  Procedure Laterality Date  . Coronary angioplasty with stent placement  2000's X 2    "1 + 1" (01/06/2013)  . Cardiac catheterization  01/06/2013  . Coronary artery bypass graft N/A 01/28/2013    Procedure: CORONARY ARTERY BYPASS GRAFTING (CABG);  Surgeon: Melrose Nakayama, MD;  Location: [place];  Service: Open Heart Surgery;  Laterality: N/A;  CABG x one, using left greater saphenous vein harvested endoscopically  . Intraoperative transesophageal echocardiogram N/A 01/28/2013    Procedure: INTRAOPERATIVE TRANSESOPHAGEAL ECHOCARDIOGRAM;  Surgeon: Melrose Nakayama, MD;  Location: [place];  Service: Open Heart Surgery;  Laterality: N/A;  . Ventricular aneurysm resection N/A 01/28/2013    Procedure: LEFT VENTRICULAR ANEURYSM REPAIR;  Surgeon: Melrose Nakayama, MD;  Location: [place];  Service: Open Heart Surgery;  Laterality: N/A;  . Left heart catheterization with coronary angiogram N/A 01/06/2013    Procedure: LEFT HEART CATHETERIZATION WITH CORONARY ANGIOGRAM;  Surgeon: Peter M Martinique, MD;  Location: [facility] CATH LAB;  Service: Cardiovascular;  Laterality: N/A;  . Left heart catheterization with coronary angiogram N/A 01/21/2013    Procedure: LEFT HEART CATHETERIZATION  WITH CORONARY ANGIOGRAM;  Surgeon: Burnell Blanks, MD;  Location[facility] C[facility] LAB;  Service: Cardiovascular;  Laterality: N/A;  . Pericardial tap N/A 01/24/2013    Procedure: PERICARDIAL TAP;  Surgeon: Blane Ohara, MD;  Location[facility] CATH LAB;  Service: Cardiovascular;  Laterality: N/A;  . Right heart catheterization Right 01/24/2013    Procedure: RIGHT HEART CATH;  Surgeon: Blane Ohara, MD;  Location: [facility] CATH LAB;  Service: Cardiovascular;  Laterality: Right;  . Mass excision Right 07/21/2014    Procedure: EXCISION RIGHT NECK MASS;  Surgeon: Erroll Luna, MD;  Location: [place];  Service: General;  Laterality: Right;     Current Outpatient Prescriptions  Medication Sig Dispense Refill  . aspirin 81 MG EC tablet Take 1 tablet (81 mg total) by mouth daily. 90 tablet 3  . carvedilol (COREG) 25 MG tablet TAKE 1/2 TABLET BY MOUTH 2 TIMES DAILY WITH A MEAL (Patient taking differently: TAKE 1 TABLET BY MOUTH  DAILY WITH A MEAL) 30 tablet 2  . clopidogrel (PLAVIX) 75 MG tablet Take 1 tablet (75 mg total) by mouth daily. 90 tablet 3  .  famotidine (PEPCID) 20 MG tablet Take 1 tablet (20 mg total) by mouth 2 (two) times daily. 30 tablet 0  . HYDROcodone-acetaminophen (NORCO) 5-325 MG per tablet Take 1 tablet by mouth every 6 (six) hours as needed for moderate pain. 30 tablet 0  . lisinopril (PRINIVIL,ZESTRIL) 10 MG tablet Take 1 tablet (10 mg total) by mouth daily. 90 tablet 3  . Multiple Vitamins-Minerals (CENTRUM ADULTS PO) Take by mouth.    . nitroGLYCERIN (NITROSTAT) 0.4 MG SL tablet Place 1 tablet (0.4 mg total) under the tongue every 5 (five) minutes as needed for chest pain (up to 3 doses). 25 tablet 0  . simvastatin (ZOCOR) 40 MG tablet Take 1 tablet (40 mg total) by mouth daily at 6 PM. 90 tablet 3  . zoster vaccine live, PF, (ZOSTAVAX) 22633 UNT/0.65ML injection Inject 19,400 Units into the skin once. 1 each 0   No current facility-administered medications for  this visit.    Allergies:   Review of patient's allergies indicates no known allergies.    Social History:  The patient  reports that he quit smoking about 21 months ago. His smoking use included Cigarettes. He has a 35 pack-year smoking history. He has never used smokeless tobacco. He reports that he does not drink alcohol or use illicit drugs.   Family History:  The patient's family history includes CAD in an other family member; Diabetes in his mother; Heart attack in his mother; Hypertension in his father and mother. There is no history of Stroke.    ROS:  Please see the history of present illness.   Otherwise, review of systems are positive for none.   All other systems are reviewed and negative.    PHYSICAL EXAM: VS:  BP 110/80 mmHg  Pulse 79  Ht 6' 1"  (1.854 m)  Wt 230 lb (104.327 kg)  BMI 30.35 kg/m2  SpO2 98% , BMI Body mass index is 30.35 kg/(m^2). GEN: Well nourished, well developed, in no acute distress HEENT: normal Neck: no JVD, carotid bruits, or masses Cardiac: RRR; no murmurs, rubs, or gallops,no edema  Respiratory:  clear to auscultation bilaterally, normal work of breathing GI: soft, nontender, nondistended, + BS MS: no deformity or atrophy Skin: warm and dry, no rash Neuro:  Strength and sensation are intact Psych: euthymic mood, full affect   EKG:  EKG is not ordered today.    Recent Labs: 04/05/2014: Pro B Natriuretic peptide (BNP) 579.9* 07/19/2014: Hemoglobin 13.8; Platelets 168 09/06/2014: ALT 23; BUN 12; Creatinine, Ser 0.89; Potassium 3.8; Sodium 138    Lipid Panel    Component Value Date/Time   CHOL 166 09/06/2014 0919   TRIG 80.0 09/06/2014 0919   HDL 54.70 09/06/2014 0919   CHOLHDL 3 09/06/2014 0919   VLDL 16.0 09/06/2014 0919   LDLCALC 95 09/06/2014 0919      Wt Readings from Last 3 Encounters:  10/25/14 230 lb (104.327 kg)  07/21/14 215 lb (97.523 kg)  05/24/14 215 lb (97.523 kg)       ASSESSMENT AND PLAN:  1. Ischemic  heart disease status post CABG in October 2014 4 repair of ruptured inferoposterior left ventricular pseudoaneurysm into the pericardial sac with resultant pericardial tamponade and shock. 2. past history of Dressler's myopericarditis postoperative 3. history of preoperative stroke with left arm weakness and left visual field defect 4. essential hypertension    Current medicines are reviewed at length with the patient today.  The patient does not have concerns regarding medicines.  The following changes  have been made:  no change  Labs/ tests ordered today include:   Orders Placed This Encounter  Procedures  . ECHOCARDIOGRAM COMPLETE   Disposition: The patient will continue on same medication.  He had a lipid panel on 09/06/14 elsewhere which was satisfactory.  He is on simvastatin.  He needs to be more careful with diet and to lose weight.  We will plan to see him in 6 months for office visit and EKG.  We will want him to get a two-dimensional echocardiogram ahead of that visit to look at his left ventricular function.  Berna Spare MD 10/25/2014 4:50 PM    [facility] [address], [place], [place]  [address] Phone: [phone]; Fax: [phone]

## 2014-11-17 ENCOUNTER — Telehealth: Payer: Self-pay | Admitting: Cardiology

## 2014-11-17 DIAGNOSIS — R931 Abnormal findings on diagnostic imaging of heart and coronary circulation: Secondary | ICD-10-CM

## 2014-11-17 NOTE — Telephone Encounter (Signed)
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  Pt reports he has already signed a medical release form to have any medical information done in our office, released to [place]. Informed Medical Records of this.

## 2014-11-17 NOTE — Telephone Encounter (Signed)
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

## 2014-11-19 NOTE — Telephone Encounter (Signed)
It has been a year since his last echo.  EF 30% then.  I would like him to have a followup echo now to help decide about release for work (rather than waiting for his 6 month appointment as previously planned).

## 2014-11-19 NOTE — Telephone Encounter (Signed)
LM TO CALL BACK ./CY 

## 2014-11-19 NOTE — Telephone Encounter (Signed)
PT  AWARE  OF RECOMMENDATIONS  TRANSFERRED TO SCHEDULER   TO MAKE   APPT   FOR  ECHO ./CY

## 2014-11-23 ENCOUNTER — Telehealth: Payer: Self-pay | Admitting: Cardiology

## 2014-11-23 ENCOUNTER — Telehealth: Payer: Self-pay | Admitting: Internal Medicine

## 2014-11-23 NOTE — Telephone Encounter (Signed)
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 Please follow up with pt. Thank you.

## 2014-11-23 NOTE — Telephone Encounter (Signed)
New message      Pt states he is returning Melinda's call

## 2014-11-24 ENCOUNTER — Other Ambulatory Visit: Payer: Self-pay

## 2014-11-24 ENCOUNTER — Ambulatory Visit (HOSPITAL_COMMUNITY): Payer: Managed Care, Other (non HMO) | Attending: Cardiology

## 2014-11-24 ENCOUNTER — Ambulatory Visit (HOSPITAL_COMMUNITY): Payer: Managed Care, Other (non HMO)

## 2014-11-24 DIAGNOSIS — I517 Cardiomegaly: Secondary | ICD-10-CM | POA: Diagnosis not present

## 2014-11-24 DIAGNOSIS — I77819 Aortic ectasia, unspecified site: Secondary | ICD-10-CM | POA: Insufficient documentation

## 2014-11-24 DIAGNOSIS — E785 Hyperlipidemia, unspecified: Secondary | ICD-10-CM | POA: Insufficient documentation

## 2014-11-24 DIAGNOSIS — I259 Chronic ischemic heart disease, unspecified: Secondary | ICD-10-CM | POA: Diagnosis present

## 2014-11-24 DIAGNOSIS — I429 Cardiomyopathy, unspecified: Secondary | ICD-10-CM

## 2014-11-24 NOTE — Telephone Encounter (Signed)
F/u       Pt returning [place] phone call. Would like a call back today.

## 2014-11-24 NOTE — Telephone Encounter (Signed)
Patient having echo today and will call him Friday with results

## 2014-11-26 ENCOUNTER — Telehealth: Payer: Self-pay | Admitting: *Deleted

## 2014-11-26 DIAGNOSIS — R9430 Abnormal result of cardiovascular function study, unspecified: Secondary | ICD-10-CM

## 2014-11-26 NOTE — Telephone Encounter (Signed)
-----   Message from Darlin Coco, MD sent at 11/25/2014  6:21 PM EDT ----- I called [name] and gave him the results of his echo.  Unfortunately his EF is still low 30-35% with multiple wall motion abnormalities.  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 I told him that he may need an ICD for primary prevention of sudden cardiac death. I would like him to see one of our EP doctors to discuss: 1. Should he have an ICD? 2. 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 Please arrange for EP referral. Thanks TB

## 2014-11-26 NOTE — Telephone Encounter (Signed)
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  Verified name and date of birth and explained to him that he would need an appointment with Dr. Doreene Burke before the letter could be written because it has been 6 months since his last appointment.  Patient transferred to scheduler to make appointment.

## 2014-11-26 NOTE — Telephone Encounter (Signed)
Referral put in Epic EP

## 2014-12-08 ENCOUNTER — Encounter: Payer: Self-pay | Admitting: Internal Medicine

## 2014-12-08 ENCOUNTER — Encounter: Payer: Managed Care, Other (non HMO) | Admitting: Internal Medicine

## 2014-12-08 NOTE — Progress Notes (Signed)
This encounter was created in error - please disregard.

## 2014-12-09 ENCOUNTER — Other Ambulatory Visit: Payer: Self-pay | Admitting: Cardiology

## 2014-12-09 ENCOUNTER — Ambulatory Visit: Payer: Managed Care, Other (non HMO) | Admitting: Internal Medicine

## 2014-12-23 ENCOUNTER — Ambulatory Visit: Payer: Managed Care, Other (non HMO) | Attending: Internal Medicine | Admitting: Internal Medicine

## 2014-12-23 ENCOUNTER — Encounter: Payer: Self-pay | Admitting: Internal Medicine

## 2014-12-23 VITALS — BP 109/72 | HR 70 | Temp 98.3°F | Resp 18 | Ht 73.0 in | Wt 240.6 lb

## 2014-12-23 DIAGNOSIS — F17200 Nicotine dependence, unspecified, uncomplicated: Secondary | ICD-10-CM | POA: Diagnosis not present

## 2014-12-23 DIAGNOSIS — Z79899 Other long term (current) drug therapy: Secondary | ICD-10-CM | POA: Insufficient documentation

## 2014-12-23 DIAGNOSIS — I771 Stricture of artery: Secondary | ICD-10-CM | POA: Insufficient documentation

## 2014-12-23 DIAGNOSIS — E785 Hyperlipidemia, unspecified: Secondary | ICD-10-CM | POA: Insufficient documentation

## 2014-12-23 DIAGNOSIS — Z7982 Long term (current) use of aspirin: Secondary | ICD-10-CM | POA: Insufficient documentation

## 2014-12-23 DIAGNOSIS — N5201 Erectile dysfunction due to arterial insufficiency: Secondary | ICD-10-CM | POA: Insufficient documentation

## 2014-12-23 NOTE — Progress Notes (Signed)
Patient here for checkup. Patient denies any pain. Patient wants to speak with Dr. Janace Hoard erectal dysfunction.

## 2014-12-23 NOTE — Progress Notes (Signed)
Patient ID: [name], male   DOB: 01/20/1961, 54 y.o.   MRN: [id number]   [name], is a 54 y.o. male  [id number]  [id number]  DOB - 11-03-1960  No chief complaint on file.       Subjective:   [name] is a 54 y.o. male with history of CAD s/p CABG, CVA, dyslipidemia and ongoing tobacco use disorder here today for a follow up visit. He is complaining of erectile dysfunction. He does not experience nocturnal tumescence, unable to sustain erection even when initiated. He continues to smoke heavily, he claims compliance with his medication. No other complaint today. Patient has No headache, No chest pain, No abdominal pain - No Nausea, No new weakness tingling or numbness, No Cough - SOB.  Problem  Erectile Dysfunction Due to Arterial Insufficiency    ALLERGIES: No Known Allergies  PAST MEDICAL HISTORY: Past Medical History  Diagnosis Date  . Coronary artery disease     a. s/p MI in 2009 in [place] with stenting of the LCX and LAD;  b. 12/2012 NSTEMI/CAD: LM nl, LAD patent prox stent, LCX 50-70 isr (FFR 0.84), RCA dom, 163m EF 40-45%-->Med Rx. c. 01/2013: anterolateral STEMI complicated by pericardial effusion (presumed purulent pericarditis) and tamponade s/p drain, ruptured LV pseudoaneurysm s/p CorMatrix patch, CABGx1 (SVG-OM1), fever, CVA, VDRF, C diff.  . Tobacco abuse     a. quit 12/2012.  . Cardiomyopathy     a. Sept/Oct 2014: EF ~40% (ICM). b. 03/2013: EF 25-30%. Off ACEI due to hypotension (MIXED NICM/ICM).  . Hyperlipidemia   . Pericardial effusion     a. 01/2013: pericardial effusion (presumed purulent pericarditis) and cardiac tamponade s/p drain. b. Persistent moderate pericardial effusion 03/2013.  . Cardiac tamponade     a. 01/2013 s/p drain.  . Pseudoaneurysm of left ventricle of heart     a. 01/2013:  Ruptured inferoposterior LV pseudoaneurysm s/p CorMatrix patch.  . CVA (cerebral infarction)     a. 01/2013 in setting of prolonged  hospitalization - residual L arm weakness.  . C. difficile colitis     a. 01/2013 during prolonged adm. b. Recurred 03/2013.  .Marland KitchenAcute respiratory failure     a. 01/2013: VDRF. b. 03/2013: hypoxia requiring supp O2 during adm, resolved by discharge.  .Marland KitchenHyponatremia     a. During late 2014.  . 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a. 03/2013: readm with hypoxia, tachycardia, elevated ESR/CRP, elevated troponin with 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and myopericarditis; treated with steroids.  . Acute myopericarditis     a. 03/2013: readm with hypoxia, tachycardia, elevated ESR/CRP, elevated troponin with 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and myopericarditis; treated with steroids.  .Marland KitchenDysphagia     a. 03/2013: felt possibly due to globus hystericus;barium swallow normal 03/2013, recent normal esophagram 01/2013. GI did not feel any further w/u needed at that time.  . Orthostatic hypotension     a. Profound during 03/2013 admission (BP dropping to 546'Nsystolic) requiring meds scaled back.  . Physical deconditioning   . Anemia due to acute blood loss     a. 01/2013 hospitalization (s/p surgery).  . Non-STEMI (non-ST elevated myocardial infarction)   . Vision loss of right eye   . Hypokalemia   . Stroke     weak lt side-lt arm  . Wears dentures     top    MEDICATIONS AT HOME: Prior to Admission medications   Medication Sig Start Date End Date Taking? Authorizing Provider  aspirin 81 MG EC tablet Take 1  tablet (81 mg total) by mouth daily. 06/22/13   Tresa Garter, MD  carvedilol (COREG) 25 MG tablet TAKE 1 TABLET BY MOUTH 2 TIMES DAILY WITH A MEAL. 12/09/14   Darlin Coco, MD  clopidogrel (PLAVIX) 75 MG tablet Take 1 tablet (75 mg total) by mouth daily. 07/05/14   Tresa Garter, MD  famotidine (PEPCID) 20 MG tablet Take 1 tablet (20 mg total) by mouth 2 (two) times daily. 05/26/13   Tresa Garter, MD  HYDROcodone-acetaminophen (NORCO) 5-325 MG per tablet Take 1 tablet by mouth every 6 (six) hours as needed for  moderate pain. 07/21/14   Erroll Luna, MD  lisinopril (PRINIVIL,ZESTRIL) 10 MG tablet Take 1 tablet (10 mg total) by mouth daily. 02/16/14   Tresa Garter, MD  Multiple Vitamins-Minerals (CENTRUM ADULTS PO) Take by mouth.    Historical Provider, MD  nitroGLYCERIN (NITROSTAT) 0.4 MG SL tablet Place 1 tablet (0.4 mg total) under the tongue every 5 (five) minutes as needed for chest pain (up to 3 doses). 04/17/13   Darlin Coco, MD  simvastatin (ZOCOR) 40 MG tablet Take 1 tablet (40 mg total) by mouth daily at 6 PM. 02/22/14   Tresa Garter, MD  zoster vaccine live, PF, (ZOSTAVAX) 50388 UNT/0.65ML injection Inject 19,400 Units into the skin once. 08/24/14   Tresa Garter, MD     Objective:   Filed Vitals:   12/23/14 1023  BP: 109/72  Pulse: 70  Temp: 98.3 F (36.8 C)  TempSrc: Oral  Resp: 18  Height: 6' 1"  (1.854 m)  Weight: 240 lb 9.6 oz (109.135 kg)  SpO2: 98%    Exam General appearance : Awake, alert, not in any distress. Speech Clear. Not toxic looking HEENT: Atraumatic and Normocephalic, pupils equally reactive to light and accomodation Neck: supple, no JVD. No cervical lymphadenopathy.  Chest:Good air entry bilaterally, no added sounds  CVS: S1 S2 regular, no murmurs.  Abdomen: Bowel sounds present, Non tender and not distended with no gaurding, rigidity or rebound. Extremities: B/L Lower Ext shows no edema, both legs are warm to touch Neurology: Awake alert, and oriented X 3, CN II-XII intact, Non focal Skin: No Rash  Data Review Lab Results  Component Value Date   HGBA1C 5.4 06/22/2013   HGBA1C 5.7* 01/05/2013     Assessment & Plan   1. Dyslipidemia  To address this please limit saturated fat to no more than 7% of your calories, limit cholesterol to 200 mg/day, increase fiber and exercise as tolerated. If needed we may add another cholesterol lowering medication to your regimen.   2. Smoking addiction  [name] was counseled on the dangers of  tobacco use, and was advised to quit. Reviewed strategies to maximize success, including removing cigarettes and smoking materials from environment, stress management and support of family/friends.  3. Erectile dysfunction due to arterial insufficiency  - Testosterone - Vit D  25 hydroxy (rtn osteoporosis monitoring)  Patient have been counseled extensively about nutrition and exercise  Return in about 3 months (around 03/24/2015) for Follow up HTN, Heart Failure and Hypertension, ED.  The patient was given clear instructions to go to ER or return to medical center if symptoms don't improve, worsen or new problems develop. The patient verbalized understanding. The patient was told to call to get lab results if they haven't heard anything in the next week.   This note has been created with Surveyor, quantity. Any transcriptional errors are unintentional.  Angelica Chessman, MD, [place], Karilyn Cota, [place], [place]   12/23/2014, 11:13 AM

## 2014-12-23 NOTE — Patient Instructions (Signed)
Erectile Dysfunction Erectile dysfunction is the inability to get or sustain a good enough erection to have sexual intercourse. Erectile dysfunction may involve:  Inability to get an erection.  Lack of enough hardness to allow penetration.  Loss of the erection before sex is finished.  Premature ejaculation. CAUSES  Certain drugs, such as:  Pain relievers.  Antihistamines.  Antidepressants.  Blood pressure medicines.  Water pills (diuretics).  Ulcer medicines.  Muscle relaxants.  Illegal drugs.  Excessive drinking.  Psychological causes, such as:  Anxiety.  Depression.  Sadness.  Exhaustion.  Performance fear.  Stress.  Physical causes, such as:  Artery problems. This may include diabetes, smoking, liver disease, or atherosclerosis.  High blood pressure.  Hormonal problems, such as low testosterone.  Obesity.  Nerve problems. This may include back or pelvic injuries, diabetes mellitus, multiple sclerosis, or Parkinson disease. SYMPTOMS  Inability to get an erection.  Lack of enough hardness to allow penetration.  Loss of the erection before sex is finished.  Premature ejaculation.  Normal erections at some times, but with frequent unsatisfactory episodes.  Orgasms that are not satisfactory in sensation or frequency.  Low sexual satisfaction in either partner because of erection problems.  A curved penis occurring with erection. The curve may cause pain or may be too curved to allow for intercourse.  Never having nighttime erections. DIAGNOSIS Your caregiver can often diagnose this condition by:  Performing a physical exam to find other diseases or specific problems with the penis.  Asking you detailed questions about the problem.  Performing blood tests to check for diabetes mellitus or to measure hormone levels.  Performing urine tests to find other underlying health conditions.  Performing an ultrasound exam to check for  scarring.  Performing a test to check blood flow to the penis.  Doing a sleep study at home to measure nighttime erections. TREATMENT   You may be prescribed medicines by mouth.  You may be given medicine injections into the penis.  You may be prescribed a vacuum pump with a ring.  Penile implant surgery may be performed. You may receive:  An inflatable implant.  A semirigid implant.  Blood vessel surgery may be performed. HOME CARE INSTRUCTIONS  If you are prescribed oral medicine, you should take the medicine as prescribed. Do not increase the dosage without first discussing it with your physician.  If you are using self-injections, be careful to avoid any veins that are on the surface of the penis. Apply pressure to the injection site for 5 minutes.  If you are using a vacuum pump, make sure you have read the instructions before using it. Discuss any questions with your physician before taking the pump home. SEEK MEDICAL CARE IF:  You experience pain that is not responsive to the pain medicine you have been prescribed.  You experience nausea or vomiting. SEEK IMMEDIATE MEDICAL CARE IF:   When taking oral or injectable medications, you experience an erection that lasts longer than 4 hours. If your physician is unavailable, go to the nearest emergency room for evaluation. An erection that lasts much longer than 4 hours can result in permanent damage to your penis.  You have pain that is severe.  You develop redness, severe pain, or severe swelling of your penis.  You have redness spreading up into your groin or lower abdomen.  You are unable to pass your urine. Document Released: 03/30/2000 Document Revised: 12/03/2012 Document Reviewed: 09/04/2012 [facility] Patient Information 2015 [place], [place]. This information is not  intended to replace advice given to you by your health care provider. Make sure you discuss any questions you have with your health care provider.

## 2014-12-24 LAB — VITAMIN D 25 HYDROXY (VIT D DEFICIENCY, FRACTURES): Vit D, 25-Hydroxy: 17 ng/mL — ABNORMAL LOW (ref 30–100)

## 2014-12-24 LAB — TESTOSTERONE: Testosterone: 313 ng/dL (ref 300–890)

## 2014-12-31 ENCOUNTER — Other Ambulatory Visit: Payer: Self-pay | Admitting: Internal Medicine

## 2014-12-31 DIAGNOSIS — E559 Vitamin D deficiency, unspecified: Secondary | ICD-10-CM

## 2014-12-31 MED ORDER — VITAMIN D (ERGOCALCIFEROL) 1.25 MG (50000 UNIT) PO CAPS: 50000.0000 [IU] | ORAL_CAPSULE | ORAL | Status: DC

## 2015-02-08 ENCOUNTER — Telehealth: Payer: Self-pay

## 2015-02-08 NOTE — Telephone Encounter (Signed)
Nurse called patient, reached voicemail. Left message for patient to call Aldrich Lloyd with [facility], at [phone]. Nurse called patient to make patient aware of normal testosterone and low vitamin D level.  Nurse called [facility] pharmacy, patient has picked up Vitamin D prescription.

## 2015-02-08 NOTE — Telephone Encounter (Signed)
Pt. Returned call. Please f/u with pt. °

## 2015-02-09 NOTE — Telephone Encounter (Signed)
Patient returned phone call from nurse, Please f/u

## 2015-02-10 NOTE — Telephone Encounter (Signed)
Nurse called patient, patient verified date of birth. Patient aware of testosterone level normal and low vitamin D level. Patient has picked up vitamin D prescription at pharmacy and has started taking medication.

## 2015-02-10 NOTE — Telephone Encounter (Signed)
Pt. Returned call to nurse. Please f/u with pt.

## 2015-03-23 ENCOUNTER — Telehealth: Payer: Self-pay | Admitting: Internal Medicine

## 2015-03-23 NOTE — Telephone Encounter (Signed)
Patient called and requested a med refill for the following medications:  simvastatin (ZOCOR) 40 MG tablet lisinopril (PRINIVIL,ZESTRIL) 10 MG tablet  Please f/u with pt.

## 2015-03-24 ENCOUNTER — Telehealth: Payer: Self-pay | Admitting: *Deleted

## 2015-03-24 DIAGNOSIS — I10 Essential (primary) hypertension: Secondary | ICD-10-CM

## 2015-03-24 DIAGNOSIS — E785 Hyperlipidemia, unspecified: Secondary | ICD-10-CM

## 2015-03-24 NOTE — Telephone Encounter (Signed)
Patient called and requested a med refill for the following medications:  simvastatin (ZOCOR) 40 MG tablet lisinopril (PRINIVIL,ZESTRIL) 10 MG tablet  Please f/u with pt.

## 2015-03-25 NOTE — Telephone Encounter (Signed)
Patient called and requested a med refill for the following medications:  simvastatin (ZOCOR) 40 MG tablet lisinopril (PRINIVIL,ZESTRIL) 10 MG tablet  Please f/u with pt

## 2015-03-28 ENCOUNTER — Other Ambulatory Visit: Payer: Self-pay | Admitting: *Deleted

## 2015-03-28 DIAGNOSIS — I10 Essential (primary) hypertension: Secondary | ICD-10-CM

## 2015-03-28 DIAGNOSIS — E785 Hyperlipidemia, unspecified: Secondary | ICD-10-CM

## 2015-03-28 MED ORDER — LISINOPRIL 10 MG PO TABS: 10.0000 mg | ORAL_TABLET | Freq: Every day | ORAL | Status: DC

## 2015-03-28 MED ORDER — SIMVASTATIN 40 MG PO TABS: 40.0000 mg | ORAL_TABLET | Freq: Every day | ORAL | Status: DC

## 2015-03-28 NOTE — Telephone Encounter (Signed)
Patient is here checking on the status of this request. He informs me that he will call back this afternoon after lunch to check once more. Please follow up with the patient at the earliest convenience as he is currently out of these medications. Thank you, Fonda Kinder, ASA

## 2015-04-13 ENCOUNTER — Other Ambulatory Visit: Payer: Self-pay | Admitting: Internal Medicine

## 2015-04-15 ENCOUNTER — Other Ambulatory Visit: Payer: Self-pay | Admitting: *Deleted

## 2015-04-15 ENCOUNTER — Other Ambulatory Visit: Payer: Self-pay | Admitting: Internal Medicine

## 2015-04-15 DIAGNOSIS — E559 Vitamin D deficiency, unspecified: Secondary | ICD-10-CM

## 2015-04-15 MED ORDER — VITAMIN D (ERGOCALCIFEROL) 1.25 MG (50000 UNIT) PO CAPS: 50000.0000 [IU] | ORAL_CAPSULE | ORAL | Status: DC

## 2015-04-15 NOTE — Telephone Encounter (Signed)
Pt. Called requesting a med refill on Vitamin D, Ergocalciferol, (DRISDOL) 50000 UNITS CAPS capsule. Please f/u with pt.

## 2015-04-15 NOTE — Telephone Encounter (Signed)
Patient last Vitamin D level was low. Patients Vitamin D was refilled.

## 2015-04-22 MED FILL — SIMVASTATIN 40 MG TABLET: 40 | 30 days supply | Qty: 30 | Fill #1

## 2015-04-22 MED FILL — LISINOPRIL 10 MG TABLET: 10 | 30 days supply | Qty: 30 | Fill #1

## 2015-05-16 MED FILL — CLOPIDOGREL 75 MG TABLET: 75 | 30 days supply | Qty: 30 | Fill #10

## 2015-05-16 MED FILL — CARVEDILOL 25 MG TABLET: 25 | 30 days supply | Qty: 60 | Fill #3 | Status: TO

## 2015-05-18 MED FILL — VIT D2 1.25 MG (50,000 UNIT: 1.25 MG | 28 days supply | Qty: 4 | Fill #1

## 2015-05-18 MED FILL — SIMVASTATIN 40 MG TABLET: 40 | 30 days supply | Qty: 30 | Fill #2

## 2015-05-18 MED FILL — LISINOPRIL 10 MG TABLET: 10 | 30 days supply | Qty: 30 | Fill #2

## 2015-05-20 ENCOUNTER — Ambulatory Visit (HOSPITAL_COMMUNITY): Payer: Managed Care, Other (non HMO) | Attending: Cardiology

## 2015-05-20 ENCOUNTER — Other Ambulatory Visit (HOSPITAL_COMMUNITY): Payer: Managed Care, Other (non HMO)

## 2015-05-20 ENCOUNTER — Other Ambulatory Visit: Payer: Self-pay | Admitting: Cardiology

## 2015-05-20 ENCOUNTER — Other Ambulatory Visit: Payer: Self-pay

## 2015-05-20 DIAGNOSIS — Z87891 Personal history of nicotine dependence: Secondary | ICD-10-CM | POA: Diagnosis not present

## 2015-05-20 DIAGNOSIS — I429 Cardiomyopathy, unspecified: Secondary | ICD-10-CM | POA: Diagnosis present

## 2015-05-20 DIAGNOSIS — R29898 Other symptoms and signs involving the musculoskeletal system: Secondary | ICD-10-CM | POA: Insufficient documentation

## 2015-05-20 DIAGNOSIS — I517 Cardiomegaly: Secondary | ICD-10-CM | POA: Insufficient documentation

## 2015-05-20 DIAGNOSIS — E785 Hyperlipidemia, unspecified: Secondary | ICD-10-CM | POA: Diagnosis not present

## 2015-05-27 ENCOUNTER — Ambulatory Visit (INDEPENDENT_AMBULATORY_CARE_PROVIDER_SITE_OTHER): Payer: Managed Care, Other (non HMO) | Admitting: Cardiology

## 2015-05-27 VITALS — BP 100/76 | HR 58 | Ht 73.0 in | Wt 256.0 lb

## 2015-05-27 DIAGNOSIS — I259 Chronic ischemic heart disease, unspecified: Secondary | ICD-10-CM

## 2015-05-27 DIAGNOSIS — I10 Essential (primary) hypertension: Secondary | ICD-10-CM | POA: Diagnosis not present

## 2015-05-27 DIAGNOSIS — I429 Cardiomyopathy, unspecified: Secondary | ICD-10-CM

## 2015-05-27 DIAGNOSIS — R931 Abnormal findings on diagnostic imaging of heart and coronary circulation: Secondary | ICD-10-CM

## 2015-05-27 MED ORDER — LISINOPRIL 10 MG PO TABS: 10.0000 mg | ORAL_TABLET | Freq: Two times a day (BID) | ORAL | Status: DC

## 2015-05-27 NOTE — Patient Instructions (Signed)
Medication Instructions:  INCREASE YOUR LISINOPRIL TO 10 MG TWICE A DAY   Labwork: NONE  Testing/Procedures: NONE  Follow-Up: Your physician recommends that you schedule a follow-up appointment in: 3 MONTH OV WITH DR Martinique AT [facility]  RETURN SOON FOR CONSULT WITH ONE OF THE EP PHYSICIANS   If you need a refill on your cardiac medications before your next appointment, please call your pharmacy.

## 2015-05-27 NOTE — Progress Notes (Signed)
Cardiology Office Note   Date:  05/27/2015   ID:  GRIER VU, DOB 1961-01-11, MRN [id number]  PCP:  Angelica Chessman, MD  Cardiologist: Darlin Coco MD  No chief complaint on file.      History of Present Illness: [name] is a 55 y.o. male who presents for 3 month follow-up office visit  This patient has a past history of ischemic heart disease and is status post CABG by Dr. Roxan Hockey. He was in his usual state of health until September 2014 when he presented with chest pain and was catheterized and showed a totally occluded right and a 50% in-stent restenosis of the circumflex stent. He had had previous stents to the LAD and circumflex in [place] in 2007. The patient was readmitted with recurrent anterolateral ST elevation chest pain in October 2014 and was found to have a large pericardial effusion with tamponade.. A pericardial drain was placed. He was almost ready for discharge when he developed signs of a stroke with left arm weakness and left visual field defect. He then developed hemodynamic collapse with hypotension and tachycardia and fever of 104 and required intubation. 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 Roxan Hockey. 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 Echo showed moderate pericardial effusion. He also had recurrent C. difficile colitis. His ejection fraction by echo was 25% and a sedimentation rate was elevated. He was discharged to skilled nursing facility and subsequently was able to return home. He still has some residual weakness of his  left arm but his range of motion has improved significantly. The patient had an echocardiogram on 11/26/13 showing ejection fraction 30%.. The past year his left ventricular function has not improved significantly.  His most recent echocardiogram of 05/20/15 shows an ejection fraction of 30-35%.  The patient is not having any exertional dyspnea or chest pain. He is not having any dizziness or syncope. His appetite is good and he has not been as careful with his diet and his weight is up 16 pounds since last visit. He has not been aware of any racing of his heart.  He has not been having any chest pain.  No dizziness or syncope. After his last office visit we had repeated his echocardiogram which had shown persistent left ventricular dysfunction and had arranged for an EP consult to consider whether he should have an ICD.  However he failed to keep that appointment. Previously he had had soft blood pressures which had prevented aggressive up titration of his ACE inhibitor for left ventricular systolic dysfunction treatment.  Now his blood pressures are doing better and we will increase his lisinopril today.  Past Medical History  Diagnosis Date  . Coronary artery disease     a. s/p MI in 2009 in [place] with stenting of the LCX and LAD;  b. 12/2012 NSTEMI/CAD: LM nl, LAD patent prox stent, LCX 50-70 isr (FFR 0.84), RCA dom, 170m EF 40-45%-->Med Rx. c. 01/2013: anterolateral STEMI complicated by pericardial effusion (presumed purulent pericarditis) and tamponade s/p drain, ruptured LV pseudoaneurysm s/p CorMatrix patch, CABGx1 (SVG-OM1), fever, CVA, VDRF, C diff.  . Tobacco abuse  a. quit 12/2012.  . Cardiomyopathy     a. Sept/Oct 2014: EF ~40% (ICM). b. 03/2013: EF 25-30%. Off ACEI due to hypotension (MIXED NICM/ICM).  . Hyperlipidemia   . Pericardial effusion     a. 01/2013: pericardial effusion (presumed purulent pericarditis) and cardiac tamponade s/p drain. b. Persistent moderate pericardial  effusion 03/2013.  . Cardiac tamponade     a. 01/2013 s/p drain.  . Pseudoaneurysm of left ventricle of heart     a. 01/2013:  Ruptured inferoposterior LV pseudoaneurysm s/p CorMatrix patch.  . CVA (cerebral infarction)     a. 01/2013 in setting of prolonged hospitalization - residual L arm weakness.  . C. difficile colitis     a. 01/2013 during prolonged adm. b. Recurred 03/2013.  Marland Kitchen Acute respiratory failure     a. 01/2013: VDRF. b. 03/2013: hypoxia requiring supp O2 during adm, resolved by discharge.  Marland Kitchen Hyponatremia     a. During late 2014.  . 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a. 03/2013: readm with hypoxia, tachycardia, elevated ESR/CRP, elevated troponin with 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and myopericarditis; treated with steroids.  . Acute myopericarditis     a. 03/2013: readm with hypoxia, tachycardia, elevated ESR/CRP, elevated troponin with 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and myopericarditis; treated with steroids.  Marland Kitchen Dysphagia     a. 03/2013: felt possibly due to globus hystericus;barium swallow normal 03/2013, recent normal esophagram 01/2013. GI did not feel any further w/u needed at that time.  . Orthostatic hypotension     a. Profound during 03/2013 admission (BP dropping to 01'U systolic) requiring meds scaled back.  . Physical deconditioning   . Anemia due to acute blood loss     a. 01/2013 hospitalization (s/p surgery).  . Non-STEMI (non-ST elevated myocardial infarction)   . Vision loss of right eye   . Hypokalemia   . Stroke     weak lt side-lt arm  . Wears dentures     top    Past Surgical History  Procedure Laterality Date  . Coronary angioplasty with stent placement  2000's X 2    "1 + 1" (01/06/2013)  . Cardiac catheterization  01/06/2013  . Coronary artery bypass graft N/A 01/28/2013    Procedure: CORONARY ARTERY BYPASS GRAFTING (CABG);  Surgeon: Melrose Nakayama, MD;  Location: [place];  Service: Open Heart Surgery;  Laterality: N/A;  CABG x one, using left greater saphenous vein harvested  endoscopically  . Intraoperative transesophageal echocardiogram N/A 01/28/2013    Procedure: INTRAOPERATIVE TRANSESOPHAGEAL ECHOCARDIOGRAM;  Surgeon: Melrose Nakayama, MD;  Location: [place];  Service: Open Heart Surgery;  Laterality: N/A;  . Ventricular aneurysm resection N/A 01/28/2013    Procedure: LEFT VENTRICULAR ANEURYSM REPAIR;  Surgeon: Melrose Nakayama, MD;  Location: [place];  Service: Open Heart Surgery;  Laterality: N/A;  . Left heart catheterization with coronary angiogram N/A 01/06/2013    Procedure: LEFT HEART CATHETERIZATION WITH CORONARY ANGIOGRAM;  Surgeon: Peter M Martinique, MD;  Location: [facility] CATH LAB;  Service: Cardiovascular;  Laterality: N/A;  . Left heart catheterization with coronary angiogram N/A 01/21/2013    Procedure: LEFT HEART CATHETERIZATION WITH CORONARY ANGIOGRAM;  Surgeon: Burnell Blanks, MD;  Location: [facility] CATH LAB;  Service: Cardiovascular;  Laterality: N/A;  . Pericardial tap N/A 01/24/2013    Procedure: PERICARDIAL TAP;  Surgeon: Blane Ohara, MD;  Location: [facility] CATH LAB;  Service: Cardiovascular;  Laterality: N/A;  . Right heart catheterization Right 01/24/2013    Procedure: RIGHT HEART CATH;  Surgeon: Juanda Bond  Burt Knack, MD;  Location: [facility] CATH LAB;  Service: Cardiovascular;  Laterality: Right;  . Mass excision Right 07/21/2014    Procedure: EXCISION RIGHT NECK MASS;  Surgeon: Erroll Luna, MD;  Location: [place];  Service: General;  Laterality: Right;     Current Outpatient Prescriptions  Medication Sig Dispense Refill  . aspirin 81 MG EC tablet Take 1 tablet (81 mg total) by mouth daily. 90 tablet 3  . carvedilol (COREG) 25 MG tablet TAKE 1 TABLET BY MOUTH 2 TIMES DAILY WITH A MEAL. 60 tablet 5  . clopidogrel (PLAVIX) 75 MG tablet Take 1 tablet (75 mg total) by mouth daily. 90 tablet 3  . famotidine (PEPCID) 20 MG tablet Take 1 tablet (20 mg total) by mouth 2 (two) times daily. 30 tablet 0  . HYDROcodone-acetaminophen (NORCO)  5-325 MG per tablet Take 1 tablet by mouth every 6 (six) hours as needed for moderate pain. 30 tablet 0  . lisinopril (PRINIVIL,ZESTRIL) 10 MG tablet Take 1 tablet (10 mg total) by mouth 2 (two) times daily. 60 tablet 5  . Multiple Vitamins-Minerals (CENTRUM ADULTS PO) Take by mouth.    . nitroGLYCERIN (NITROSTAT) 0.4 MG SL tablet Place 1 tablet (0.4 mg total) under the tongue every 5 (five) minutes as needed for chest pain (up to 3 doses). 25 tablet 0  . simvastatin (ZOCOR) 40 MG tablet Take 1 tablet (40 mg total) by mouth daily at 6 PM. 30 tablet 3  . Vitamin D, Ergocalciferol, (DRISDOL) 50000 units CAPS capsule Take 1 capsule (50,000 Units total) by mouth every 7 (seven) days. 12 capsule 0  . zoster vaccine live, PF, (ZOSTAVAX) 09983 UNT/0.65ML injection Inject 19,400 Units into the skin once. 1 each 0   No current facility-administered medications for this visit.    Allergies:   Review of patient's allergies indicates no known allergies.    Social History:  The patient  reports that he quit smoking about 2 years ago. His smoking use included Cigarettes. He has a 35 pack-year smoking history. He has never used smokeless tobacco. He reports that he does not drink alcohol or use illicit drugs.   Family History:  The patient's family history includes Diabetes in his mother; Heart attack in his mother; Hypertension in his father and mother. There is no history of Stroke.    ROS:  Please see the history of present illness.   Otherwise, review of systems are positive for none.   All other systems are reviewed and negative.    PHYSICAL EXAM: VS:  BP 100/76 mmHg  Pulse 58  Ht 6' 1"  (1.854 m)  Wt 256 lb (116.121 kg)  BMI 33.78 kg/m2 , BMI Body mass index is 33.78 kg/(m^2). GEN: Well nourished, well developed, in no acute distress HEENT: normal Neck: no JVD, carotid bruits, or masses Cardiac: RRR; no murmurs, rubs, or gallops,no edema  Respiratory:  clear to auscultation bilaterally, normal  work of breathing GI: soft, nontender, nondistended, + BS MS: no deformity or atrophy Skin: warm and dry, no rash Neuro:  Strength and sensation are intact Psych: euthymic mood, full affect   EKG:  EKG is ordered today. The ekg ordered today demonstrates Sinus bradycardia with first-degree AV block.  Old inferior wall myocardial infarction with persistent T-wave inversion in inferolateral leads.  Since last tracing of 05/13/14, no significant change.   Recent Labs: 07/19/2014: Hemoglobin 13.8; Platelets 168 09/06/2014: ALT 23; BUN 12; Creatinine, Ser 0.89; Potassium 3.8; Sodium 138    Lipid  Panel    Component Value Date/Time   CHOL 166 09/06/2014 0919   TRIG 80.0 09/06/2014 0919   HDL 54.70 09/06/2014 0919   CHOLHDL 3 09/06/2014 0919   VLDL 16.0 09/06/2014 0919   LDLCALC 95 09/06/2014 0919      Wt Readings from Last 3 Encounters:  05/27/15 256 lb (116.121 kg)  12/23/14 240 lb 9.6 oz (109.135 kg)  10/25/14 230 lb (104.327 kg)         ASSESSMENT AND PLAN:  1. Ischemic heart disease status post CABG in October 2014 4 repair of ruptured inferoposterior left ventricular pseudoaneurysm into the pericardial sac with resultant pericardial tamponade and shock. 2. past history of Dressler's myopericarditis postoperative 3. history of preoperative stroke with left arm weakness and left visual field defect 4. essential hypertension 5. Ischemic cardiomyopathy with left ventricular ejection fraction 30-35% by echocardiogram 05/20/15   Current medicines are reviewed at length with the patient today.  The patient has concerns regarding medicines.  The following changes have been made:  We are increasing her lisinopril to 10 mg twice a day  Labs/ tests ordered today include:   Orders Placed This Encounter  Procedures  . Ambulatory referral to Cardiac Electrophysiology  . EKG 12-Lead     Disposition: Increase lisinopril.  We will refer to EP to see if he should be considered for  an ICD implant now versus trying to uptitrate him further on his lisinopril. Following my retirement he will be followed by Dr. Peter Martinique in 3 months.  Berna Spare MD 05/27/2015 10:53 AM    [address], [place], [place]  [address] Phone: [phone]; Fax: [phone]

## 2015-06-03 ENCOUNTER — Encounter: Payer: Self-pay | Admitting: Internal Medicine

## 2015-06-03 ENCOUNTER — Ambulatory Visit (INDEPENDENT_AMBULATORY_CARE_PROVIDER_SITE_OTHER): Payer: Managed Care, Other (non HMO) | Admitting: Internal Medicine

## 2015-06-03 VITALS — BP 104/70 | HR 75 | Ht 73.0 in | Wt 252.6 lb

## 2015-06-03 DIAGNOSIS — I25810 Atherosclerosis of coronary artery bypass graft(s) without angina pectoris: Secondary | ICD-10-CM | POA: Diagnosis not present

## 2015-06-03 DIAGNOSIS — I255 Ischemic cardiomyopathy: Secondary | ICD-10-CM | POA: Diagnosis not present

## 2015-06-03 DIAGNOSIS — I519 Heart disease, unspecified: Secondary | ICD-10-CM

## 2015-06-03 NOTE — Patient Instructions (Signed)
Medication Instructions:  Your physician recommends that you continue on your current medications as directed. Please refer to the Current Medication list given to you today.   Labwork: None ordered   Testing/Procedures: None ordered   Follow-Up: Your physician wants you to follow-up in: AS NEEDED with Dr. Rayann Heman  Your physician has recommended that you have a defibrillator inserted. An implantable cardioverter defibrillator (ICD) is a small device that is placed in your chest or, in rare cases, your abdomen. This device uses electrical pulses or shocks to help control life-threatening, irregular heartbeats that could lead the heart to suddenly stop beating (sudden cardiac arrest). Leads are attached to the ICD that goes into your heart. This is done in the hospital and usually requires an overnight stay. Please see the instruction sheet given to you today for more information.- CALL IF YOU WISH TO PROCEED - [phone]- Janan Halter    Any Other Special Instructions Will Be Listed Below (If Applicable).     If you need a refill on your cardiac medications before your next appointment, please call your pharmacy.

## 2015-06-03 NOTE — Progress Notes (Signed)
Electrophysiology Office Note   Date:  06/03/2015   ID:  [name], DOB August 12, 1960, MRN [id number]  PCP:  Angelica Chessman, MD  Cardiologist:  Dr Mare Ferrari (referring) Primary Electrophysiologist: Thompson Grayer, MD    Chief Complaint  Patient presents with  . Congestive Heart Failure     History of Present Illness: [name] is a 55 y.o. male who presents today for electrophysiology evaluation.   The patient has an ischemic CM.  He is s/p large MI in 2014 with ruptured LV pseudoaneurysm requiring emergent surgery and CABG.  He has done well since.  Report SOB with moderate activity.  Continues to smoke but has been compliant with medical therapy.  Further medical optimization has been complicated by soft BP.  Today, he denies symptoms of palpitations, chest pain,  orthopnea, PND, lower extremity edema, claudication, dizziness, presyncope, syncope, bleeding, or neurologic sequela. The patient is tolerating medications without difficulties and is otherwise without complaint today.    Past Medical History  Diagnosis Date  . Coronary artery disease     a. s/p MI in 2009 in [place] with stenting of the LCX and LAD;  b. 12/2012 NSTEMI/CAD: LM nl, LAD patent prox stent, LCX 50-70 isr (FFR 0.84), RCA dom, 163m EF 40-45%-->Med Rx. c. 01/2013: anterolateral STEMI complicated by pericardial effusion (presumed purulent pericarditis) and tamponade s/p drain, ruptured LV pseudoaneurysm s/p CorMatrix patch, CABGx1 (SVG-OM1), fever, CVA, VDRF, C diff.  . Tobacco abuse     a. quit 12/2012.  . Ischemic cardiomyopathy     a. Sept/Oct 2014: EF ~40% (ICM). b. 03/2013: EF 25-30%. Off ACEI due to hypotension (MIXED NICM/ICM).  . Hyperlipidemia   . Pericardial effusion     a. 01/2013: pericardial effusion (presumed purulent pericarditis) and cardiac tamponade s/p drain. b. Persistent moderate pericardial effusion 03/2013.  . Cardiac tamponade     a. 01/2013 s/p drain.  . Pseudoaneurysm of left  ventricle of heart     a. 01/2013:  Ruptured inferoposterior LV pseudoaneurysm s/p CorMatrix patch.  . CVA (cerebral infarction)     a. 01/2013 in setting of prolonged hospitalization - residual L arm weakness.  . C. difficile colitis     a. 01/2013 during prolonged adm. b. Recurred 03/2013.  .Marland KitchenAcute respiratory failure ([facility]     a. 01/2013: VDRF. b. 03/2013: hypoxia requiring supp O2 during adm, resolved by discharge.  .Marland KitchenHyponatremia     a. During late 2014.  . 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([facility]     a. 03/2013: readm with hypoxia, tachycardia, elevated ESR/CRP, elevated troponin with 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and myopericarditis; treated with steroids.  . Acute myopericarditis     a. 03/2013: readm with hypoxia, tachycardia, elevated ESR/CRP, elevated troponin with 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and myopericarditis; treated with steroids.  . Anemia due to acute blood loss     a. 01/2013 hospitalization (s/p surgery).  . Vision loss of right eye   . Hypokalemia   . Stroke ([place]     weak lt side-lt arm  . Wears dentures     top  . Sinus bradycardia     asymptomatic   Past Surgical History  Procedure Laterality Date  . Coronary angioplasty with stent placement  2000's X 2    "1 + 1" (01/06/2013)  . Cardiac catheterization  01/06/2013  . Coronary artery bypass graft N/A 01/28/2013    Procedure: CORONARY ARTERY BYPASS GRAFTING (CABG);  Surgeon: SMelrose Nakayama MD;  [place]  Service: Open Heart Surgery;  Laterality: N/A;  CABG x one, using left greater saphenous vein harvested endoscopically  . Intraoperative transesophageal echocardiogram N/A 01/28/2013    Procedure: INTRAOPERATIVE TRANSESOPHAGEAL ECHOCARDIOGRAM;  Surgeon: Melrose Nakayama, MD;  Location: [place];  Service: Open Heart Surgery;  Laterality: N/A;  . Ventricular aneurysm resection N/A 01/28/2013    Procedure: LEFT VENTRICULAR ANEURYSM REPAIR;  Surgeon: Melrose Nakayama, MD;  Location: [place];  Service: Open Heart Surgery;  Laterality:  N/A;  . Left heart catheterization with coronary angiogram N/A 01/06/2013    Procedure: LEFT HEART CATHETERIZATION WITH CORONARY ANGIOGRAM;  Surgeon: Peter M Martinique, MD;  Location: [facility] CATH LAB;  Service: Cardiovascular;  Laterality: N/A;  . Left heart catheterization with coronary angiogram N/A 01/21/2013    Procedure: LEFT HEART CATHETERIZATION WITH CORONARY ANGIOGRAM;  Surgeon: Burnell Blanks, MD;  Location: [facility] CATH LAB;  Service: Cardiovascular;  Laterality: N/A;  . Pericardial tap N/A 01/24/2013    Procedure: PERICARDIAL TAP;  Surgeon: Blane Ohara, MD;  Location[facility] C[facility] LAB;  Service: Cardiovascular;  Laterality: N/A;  . Right heart catheterization Right 01/24/2013    Procedure: RIGHT HEART CATH;  Surgeon: Blane Ohara, MD;  Location: [facility] CATH LAB;  Service: Cardiovascular;  Laterality: Right;  . Mass excision Right 07/21/2014    Procedure: EXCISION RIGHT NECK MASS;  Surgeon: Erroll Luna, MD;  Location: [place];  Service: General;  Laterality: Right;     Current Outpatient Prescriptions  Medication Sig Dispense Refill  . aspirin 81 MG EC tablet Take 1 tablet (81 mg total) by mouth daily. 90 tablet 3  . carvedilol (COREG) 25 MG tablet TAKE 1 TABLET BY MOUTH 2 TIMES DAILY WITH A MEAL. 60 tablet 5  . clopidogrel (PLAVIX) 75 MG tablet Take 1 tablet (75 mg total) by mouth daily. 90 tablet 3  . famotidine (PEPCID) 20 MG tablet Take 1 tablet (20 mg total) by mouth 2 (two) times daily. 30 tablet 0  . HYDROcodone-acetaminophen (NORCO) 5-325 MG per tablet Take 1 tablet by mouth every 6 (six) hours as needed for moderate pain. 30 tablet 0  . lisinopril (PRINIVIL,ZESTRIL) 10 MG tablet Take 1 tablet (10 mg total) by mouth 2 (two) times daily. 60 tablet 5  . Multiple Vitamins-Minerals (CENTRUM ADULTS PO) Take 1 tablet by mouth daily.     . nitroGLYCERIN (NITROSTAT) 0.4 MG SL tablet Place 1 tablet (0.4 mg total) under the tongue every 5 (five) minutes as needed for chest  pain (up to 3 doses). 25 tablet 0  . simvastatin (ZOCOR) 40 MG tablet Take 1 tablet (40 mg total) by mouth daily at 6 PM. 30 tablet 3  . Vitamin D, Ergocalciferol, (DRISDOL) 50000 units CAPS capsule Take 1 capsule (50,000 Units total) by mouth every 7 (seven) days. 12 capsule 0  . zoster vaccine live, PF, (ZOSTAVAX) 97673 UNT/0.65ML injection Inject 19,400 Units into the skin once. 1 each 0   No current facility-administered medications for this visit.    Allergies:   Review of patient's allergies indicates no known allergies.   Social History:  The patient  reports that he has been smoking Cigarettes.  He has a 35 pack-year smoking history. He has never used smokeless tobacco. He reports that he does not drink alcohol or use illicit drugs.   Family History:  The patient's  family history includes Diabetes in his mother; Heart attack in his mother; Hypertension in his father and mother. There is no history of Stroke.  ROS:  Please see the history of present illness.   All other systems are reviewed and negative.    PHYSICAL EXAM: VS:  BP 104/70 mmHg  Pulse 75  Ht 6' 1"  (1.854 m)  Wt 252 lb 9.6 oz (114.579 kg)  BMI 33.33 kg/m2 , BMI Body mass index is 33.33 kg/(m^2). GEN: Well nourished, well developed, in no acute distress HEENT: normal Neck: no JVD, carotid bruits, or masses Cardiac: RRR; no murmurs, rubs, or gallops,no edema  Respiratory:  clear to auscultation bilaterally, normal work of breathing GI: soft, nontender, nondistended, + BS MS: no deformity or atrophy Skin: warm and dry  Neuro:  Strength and sensation are intact Psych: euthymic mood, full affect  EKG:  EKG 05/26/14 reveals sinus bradycardia 58 bpm, PR 228 sec, inferior infarct, lateral TWI   Recent Labs: 07/19/2014: Hemoglobin 13.8; Platelets 168 09/06/2014: ALT 23; BUN 12; Creatinine, Ser 0.89; Potassium 3.8; Sodium 138    Lipid Panel     Component Value Date/Time   CHOL 166 09/06/2014 0919   TRIG 80.0  09/06/2014 0919   HDL 54.70 09/06/2014 0919   CHOLHDL 3 09/06/2014 0919   VLDL 16.0 09/06/2014 0919   LDLCALC 95 09/06/2014 0919     Wt Readings from Last 3 Encounters:  06/03/15 252 lb 9.6 oz (114.579 kg)  05/27/15 256 lb (116.121 kg)  12/23/14 240 lb 9.6 oz (109.135 kg)      Other studies Reviewed: Additional studies/ records that were reviewed today include: Dr Bobbye Riggs notes, echo 05/20/15  Review of the above records today demonstrates: EF 30%, moderate LA enlargement   ASSESSMENT AND PLAN:  1.  Ischemic CM/ CAD/chronic systolic dysfunction/ sinus bradycardia with first degree AV block The patient has an ischemic CM (EF 30-35%), NYHA Class II/III CHF, and CAD.  He also has sinus bradycardia. At this time, he meets MADIT II/ SCD-HeFT criteria for ICD implantation for primary prevention of sudden death.  His QRS < 130 msec and thus he is not a candidate for CRT.  Risks, benefits, alternatives to ICD implantation were discussed in detail with the patient today. The patient  understands that the risks include but are not limited to bleeding, infection, pneumothorax, perforation, tamponade, vascular damage, renal failure, MI, stroke, death, inappropriate shocks, and lead dislodgement and wishes to contemplate this option further.  He has a [employer] license which he uses for his job.  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  He will consider this in his decision.  If he decides to proceed with an ICD, he would prefer a MRI compatible device.  I would also plan to use an atrial lead given his sinus bradycardia.  2. Tobacco Cessation advised  3. Obesity Weight loss advised Body mass index is 33.33 kg/(m^2).  Follow-up with cardiology as scheduled He will contact my office should he decide to proceed with an ICD.  I will otherwise see him as needed.  Current medicines are reviewed at length with the patient  today.   The patient does not have concerns regarding his medicines.  The following changes were made today:  none   Signed, Thompson Grayer, MD  06/03/2015 10:57 PM     [address] [place] [address] [phone] (office) [phone] (fax)

## 2015-06-08 MED FILL — LISINOPRIL 10 MG TABLET: 10 | 30 days supply | Qty: 60 | Fill #0

## 2015-06-16 MED FILL — CLOPIDOGREL 75 MG TABLET: 75 | 30 days supply | Qty: 30 | Fill #11

## 2015-06-16 MED FILL — CARVEDILOL 25 MG TABLET: 25 | 30 days supply | Qty: 60 | Fill #4 | Status: TO

## 2015-06-16 MED FILL — SIMVASTATIN 40 MG TABLET: 40 | 30 days supply | Qty: 30 | Fill #3

## 2015-07-11 MED FILL — VIT D2 1.25 MG (50,000 UNIT: 1.25 MG | 28 days supply | Qty: 4 | Fill #2 | Status: TO

## 2015-07-11 MED FILL — LISINOPRIL 10 MG TABLET: 10 | 30 days supply | Qty: 60 | Fill #1 | Status: TO

## 2015-07-20 ENCOUNTER — Other Ambulatory Visit: Payer: Self-pay | Admitting: Internal Medicine

## 2015-07-20 MED FILL — CARVEDILOL 25 MG TABLET: 25 | 30 days supply | Qty: 60 | Fill #5 | Status: TO

## 2015-07-21 MED FILL — CLOPIDOGREL 75 MG TABLET: 75 | 30 days supply | Qty: 90 | Fill #0 | Status: TO

## 2015-08-01 ENCOUNTER — Other Ambulatory Visit: Payer: Self-pay | Admitting: Internal Medicine

## 2015-08-01 MED FILL — SIMVASTATIN 40 MG TABLET: 40 | 30 days supply | Qty: 30 | Fill #0 | Status: TO

## 2015-09-01 ENCOUNTER — Ambulatory Visit: Payer: Managed Care, Other (non HMO) | Admitting: Cardiology

## 2015-10-17 ENCOUNTER — Other Ambulatory Visit: Payer: Self-pay

## 2015-10-19 ENCOUNTER — Other Ambulatory Visit: Payer: Self-pay | Admitting: Internal Medicine

## 2015-10-24 ENCOUNTER — Telehealth: Payer: Self-pay | Admitting: Internal Medicine

## 2015-10-24 NOTE — Telephone Encounter (Signed)
This patient needs an office visit and a repeat Vit D level before refill can be authorized. Thanks

## 2015-10-24 NOTE — Telephone Encounter (Signed)
Patient aware of the need for repeat vit D level and office visit prior to getting a refill on his vitamin D. He was transferred to scheduling to make an appointment.

## 2015-10-24 NOTE — Telephone Encounter (Signed)
Pt. Called requesting a refill on Vitamin D, Ergocalciferol, (DRISDOL) 50000 units CAPS capsule.  Pt. Would like the Rx to be sent to [facility] on [address]. Please f/u with pt.

## 2015-11-03 ENCOUNTER — Encounter: Payer: Self-pay | Admitting: Internal Medicine

## 2015-11-03 ENCOUNTER — Ambulatory Visit: Payer: Managed Care, Other (non HMO) | Attending: Internal Medicine | Admitting: Internal Medicine

## 2015-11-03 VITALS — BP 95/63 | HR 66 | Temp 98.8°F | Resp 18 | Ht 73.0 in | Wt 258.6 lb

## 2015-11-03 DIAGNOSIS — F17200 Nicotine dependence, unspecified, uncomplicated: Secondary | ICD-10-CM | POA: Diagnosis not present

## 2015-11-03 DIAGNOSIS — Z7982 Long term (current) use of aspirin: Secondary | ICD-10-CM | POA: Diagnosis not present

## 2015-11-03 DIAGNOSIS — Z1211 Encounter for screening for malignant neoplasm of colon: Secondary | ICD-10-CM

## 2015-11-03 DIAGNOSIS — I252 Old myocardial infarction: Secondary | ICD-10-CM | POA: Diagnosis not present

## 2015-11-03 DIAGNOSIS — Z8673 Personal history of transient ischemic attack (TIA), and cerebral infarction without residual deficits: Secondary | ICD-10-CM | POA: Insufficient documentation

## 2015-11-03 DIAGNOSIS — Z951 Presence of aortocoronary bypass graft: Secondary | ICD-10-CM | POA: Insufficient documentation

## 2015-11-03 DIAGNOSIS — I2510 Atherosclerotic heart disease of native coronary artery without angina pectoris: Secondary | ICD-10-CM | POA: Diagnosis not present

## 2015-11-03 DIAGNOSIS — H5461 Unqualified visual loss, right eye, normal vision left eye: Secondary | ICD-10-CM | POA: Diagnosis not present

## 2015-11-03 DIAGNOSIS — E785 Hyperlipidemia, unspecified: Secondary | ICD-10-CM | POA: Diagnosis not present

## 2015-11-03 DIAGNOSIS — Z79899 Other long term (current) drug therapy: Secondary | ICD-10-CM | POA: Diagnosis not present

## 2015-11-03 DIAGNOSIS — Z87891 Personal history of nicotine dependence: Secondary | ICD-10-CM | POA: Insufficient documentation

## 2015-11-03 DIAGNOSIS — I10 Essential (primary) hypertension: Secondary | ICD-10-CM | POA: Diagnosis not present

## 2015-11-03 LAB — COMPLETE METABOLIC PANEL WITH GFR
ALBUMIN: 4 g/dL (ref 3.6–5.1)
ALK PHOS: 95 U/L (ref 40–115)
ALT: 24 U/L (ref 9–46)
AST: 23 U/L (ref 10–35)
BILIRUBIN TOTAL: 0.4 mg/dL (ref 0.2–1.2)
BUN: 11 mg/dL (ref 7–25)
CO2: 24 mmol/L (ref 20–31)
Calcium: 9.3 mg/dL (ref 8.6–10.3)
Chloride: 106 mmol/L (ref 98–110)
Creat: 0.9 mg/dL (ref 0.70–1.33)
Glucose, Bld: 91 mg/dL (ref 65–99)
Potassium: 4.4 mmol/L (ref 3.5–5.3)
Sodium: 140 mmol/L (ref 135–146)
TOTAL PROTEIN: 6.8 g/dL (ref 6.1–8.1)

## 2015-11-03 LAB — LIPID PANEL
Cholesterol: 156 mg/dL (ref 125–200)
HDL: 62 mg/dL (ref >=40)
LDL Cholesterol: 75 mg/dL (ref <130)
TRIGLYCERIDES: 97 mg/dL (ref <150)
Total CHOL/HDL Ratio: 2.5 Ratio (ref <=5.0)
VLDL: 19 mg/dL (ref <30)

## 2015-11-03 NOTE — Progress Notes (Signed)
Patient is here for Vitamin D recheck  Patient denies pain at this time.  Patient has taken medication today and patient has eaten today.

## 2015-11-03 NOTE — Progress Notes (Signed)
Patient ID: [name], male   DOB: June 22, 1960, 54 y.o.   MRN: [id number]   [name], is a 55 y.o. male  [id number]  [id number]  DOB - 09-02-1960  Chief Complaint  Patient presents with  . Follow-up    Vitamin D        Subjective:   [name] is a 55 y.o. male with multiple medical history including CAD, ischemic CM, s/p large MI in 2014 with ruptured LV pseudoaneurysm requiring emergent surgery and CABG here today for follow up visit and for Vit D check.He has done well since CABG.He reports SOB with moderate activity.Patient continues to smoke heavily but has been compliant with medical therapy. His most recent ECHO showed reduced EF of 30 - 35%, patient was advised AICD insertion for primary prevention of sudden death but patient declined. Today, he denies symptoms of palpitations, chest pain, orthopnea, PND, lower extremity edema, claudication, dizziness, presyncope, syncope, bleeding, or neurologic sequela. The patient is tolerating medications without difficulties and is otherwise without complaint today. No abdominal pain - No Nausea, No new weakness tingling or numbness.  Problem  Essential Hypertension    ALLERGIES: No Known Allergies  PAST MEDICAL HISTORY: Past Medical History  Diagnosis Date  . Coronary artery disease     a. s/p MI in 2009 in [place] with stenting of the LCX and LAD;  b. 12/2012 NSTEMI/CAD: LM nl, LAD patent prox stent, LCX 50-70 isr (FFR 0.84), RCA dom, 115m EF 40-45%-->Med Rx. c. 01/2013: anterolateral STEMI complicated by pericardial effusion (presumed purulent pericarditis) and tamponade s/p drain, ruptured LV pseudoaneurysm s/p CorMatrix patch, CABGx1 (SVG-OM1), fever, CVA, VDRF, C diff.  . Tobacco abuse     a. quit 12/2012.  . Ischemic cardiomyopathy     a. Sept/Oct 2014: EF ~40% (ICM). b. 03/2013: EF 25-30%. Off ACEI due to hypotension (MIXED NICM/ICM).  . Hyperlipidemia   . Pericardial effusion     a. 01/2013: pericardial  effusion (presumed purulent pericarditis) and cardiac tamponade s/p drain. b. Persistent moderate pericardial effusion 03/2013.  . Cardiac tamponade     a. 01/2013 s/p drain.  . Pseudoaneurysm of left ventricle of heart     a. 01/2013:  Ruptured inferoposterior LV pseudoaneurysm s/p CorMatrix patch.  . CVA (cerebral infarction)     a. 01/2013 in setting of prolonged hospitalization - residual L arm weakness.  . C. difficile colitis     a. 01/2013 during prolonged adm. b. Recurred 03/2013.  .Marland KitchenAcute respiratory failure ([facility]     a. 01/2013: VDRF. b. 03/2013: hypoxia requiring supp O2 during adm, resolved by discharge.  .Marland KitchenHyponatremia     a. During late 2014.  . 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([place]     a. 03/2013: readm with hypoxia, tachycardia, elevated ESR/CRP, elevated troponin with 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and myopericarditis; treated with steroids.  . Acute myopericarditis     a. 03/2013: readm with hypoxia, tachycardia, elevated ESR/CRP, elevated troponin with 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and myopericarditis; treated with steroids.  . Anemia due to acute blood loss     a. 01/2013 hospitalization (s/p surgery).  . Vision loss of right eye   . Hypokalemia   . Stroke ([facility]     weak lt side-lt arm  . Wears dentures     top  . Sinus bradycardia     asymptomatic    MEDICATIONS AT HOME: Prior to Admission medications   Medication Sig Start Date End Date Taking? Authorizing Provider  aspirin 81 MG EC tablet Take 1 tablet (  81 mg total) by mouth daily. 06/22/13  Yes Tresa Garter, MD  carvedilol (COREG) 25 MG tablet TAKE 1 TABLET BY MOUTH 2 TIMES DAILY WITH A MEAL. 12/09/14  Yes Darlin Coco, MD  clopidogrel (PLAVIX) 75 MG tablet TAKE 1 TABLET BY MOUTH DAILY. 07/21/15  Yes Tresa Garter, MD  famotidine (PEPCID) 20 MG tablet Take 1 tablet (20 mg total) by mouth 2 (two) times daily. 05/26/13  Yes Tresa Garter, MD  HYDROcodone-acetaminophen (NORCO) 5-325 MG per tablet Take 1 tablet by mouth every 6  (six) hours as needed for moderate pain. 07/21/14  Yes Erroll Luna, MD  lisinopril (PRINIVIL,ZESTRIL) 10 MG tablet Take 1 tablet (10 mg total) by mouth 2 (two) times daily. 05/27/15  Yes Darlin Coco, MD  Multiple Vitamins-Minerals (CENTRUM ADULTS PO) Take 1 tablet by mouth daily.    Yes Historical Provider, MD  nitroGLYCERIN (NITROSTAT) 0.4 MG SL tablet Place 1 tablet (0.4 mg total) under the tongue every 5 (five) minutes as needed for chest pain (up to 3 doses). 04/17/13  Yes Darlin Coco, MD  simvastatin (ZOCOR) 40 MG tablet TAKE 1 TABLET BY MOUTH DAILY AT 6 PM. 08/01/15  Yes Tresa Garter, MD  Vitamin D, Ergocalciferol, (DRISDOL) 50000 units CAPS capsule Take 1 capsule (50,000 Units total) by mouth every 7 (seven) days. 04/15/15  Yes Tresa Garter, MD  zoster vaccine live, PF, (ZOSTAVAX) 19147 UNT/0.65ML injection Inject 19,400 Units into the skin once. 08/24/14  Yes Tresa Garter, MD     Objective:   Filed Vitals:   11/03/15 1519  BP: 95/63  Pulse: 66  Temp: 98.8 F (37.1 C)  TempSrc: Oral  Resp: 18  Height: _0  (1.854 m)  Weight: 258 lb 9.6 oz (117.3 kg)  SpO2: 98%    Exam General appearance : Awake, alert, not in any distress. Speech Clear. Not toxic looking HEENT: Atraumatic and Normocephalic, pupils equally reactive to light and accomodation Neck: Supple, no JVD. No cervical lymphadenopathy.  Chest: Good air entry bilaterally, no added sounds  CVS: S1 S2 regular, no murmurs.  Abdomen: Bowel sounds present, Non tender and not distended with no gaurding, rigidity or rebound. Extremities: B/L Lower Ext shows no edema, both legs are warm to touch Neurology: Awake alert, and oriented X 3, CN II-XII intact, Non focal Skin: No Rash  Data Review Lab Results  Component Value Date   HGBA1C 5.4 06/22/2013   HGBA1C 5.7* 01/05/2013     Assessment & Plan   1. Essential hypertension  - COMPLETE METABOLIC PANEL WITH GFR  We have discussed target BP  range and blood pressure goal. I have advised patient to check BP regularly and to call us back or report to clinic if the numbers are consistently higher than 140/90. We discussed the importance of compliance with medical therapy and DASH diet recommended, consequences of uncontrolled hypertension discussed.  - continue current BP medications  2. Dyslipidemia  - Lipid panel  To address this please limit saturated fat to no more than 7% of your calories, limit cholesterol to 200 mg/day, increase fiber and exercise as tolerated. If needed we may add another cholesterol lowering medication to your regimen.   3. Smoking addiction  - VITAMIN D 25 Hydroxy (Vit-D Deficiency, Fractures)  Dagan was counseled on the dangers of tobacco use, and was advised to quit. Reviewed strategies to maximize success, including removing cigarettes and smoking materials from environment, stress management and support of family/friends.  4. Colon cancer  screening  Patient has appointment for colonoscopy next month, August  Patient have been counseled extensively about nutrition and exercise  Return in about 6 months (around 05/05/2016) for Heart Failure and Hypertension, Routine Follow Up.  The patient was given clear instructions to go to ER or return to medical center if symptoms don't improve, worsen or new problems develop. The patient verbalized understanding. The patient was told to call to get lab results if they haven't heard anything in the next week.   This note has been created with Surveyor, quantity. Any transcriptional errors are unintentional.    Angelica Chessman, MD, [place], [place], [place], [place] and [place], [place]   11/03/2015, 4:00 PM

## 2015-11-03 NOTE — Patient Instructions (Signed)
DASH Eating Plan DASH stands for "Dietary Approaches to Stop Hypertension." The DASH eating plan is a healthy eating plan that has been shown to reduce high blood pressure (hypertension). Additional health benefits may include reducing the risk of type 2 diabetes mellitus, heart disease, and stroke. The DASH eating plan may also help with weight loss. WHAT DO I NEED TO KNOW ABOUT THE DASH EATING PLAN? For the DASH eating plan, you will follow these general guidelines:  Choose foods with a percent daily value for sodium of less than 5% (as listed on the food label).  Use salt-free seasonings or herbs instead of table salt or sea salt.  Check with your health care provider or pharmacist before using salt substitutes.  Eat lower-sodium products, often labeled as "lower sodium" or "no salt added."  Eat fresh foods.  Eat more vegetables, fruits, and low-fat dairy products.  Choose whole grains. Look for the word "whole" as the first word in the ingredient list.  Choose fish and skinless chicken or turkey more often than red meat. Limit fish, poultry, and meat to 6 oz (170 g) each day.  Limit sweets, desserts, sugars, and sugary drinks.  Choose heart-healthy fats.  Limit cheese to 1 oz (28 g) per day.  Eat more home-cooked food and less restaurant, buffet, and fast food.  Limit fried foods.  Cook foods using methods other than frying.  Limit canned vegetables. If you do use them, rinse them well to decrease the sodium.  When eating at a restaurant, ask that your food be prepared with less salt, or no salt if possible. WHAT FOODS CAN I EAT? Seek help from a dietitian for individual calorie needs. Grains Whole grain or whole wheat bread. Brown rice. Whole grain or whole wheat pasta. Quinoa, bulgur, and whole grain cereals. Low-sodium cereals. Corn or whole wheat flour tortillas. Whole grain cornbread. Whole grain crackers. Low-sodium crackers. Vegetables Fresh or frozen vegetables  (raw, steamed, roasted, or grilled). Low-sodium or reduced-sodium tomato and vegetable juices. Low-sodium or reduced-sodium tomato sauce and paste. Low-sodium or reduced-sodium canned vegetables.  Fruits All fresh, canned (in natural juice), or frozen fruits. Meat and Other Protein Products Ground beef (85% or leaner), grass-fed beef, or beef trimmed of fat. Skinless chicken or turkey. Ground chicken or turkey. Pork trimmed of fat. All fish and seafood. Eggs. Dried beans, peas, or lentils. Unsalted nuts and seeds. Unsalted canned beans. Dairy Low-fat dairy products, such as skim or 1% milk, 2% or reduced-fat cheeses, low-fat ricotta or cottage cheese, or plain low-fat yogurt. Low-sodium or reduced-sodium cheeses. Fats and Oils Tub margarines without trans fats. Light or reduced-fat mayonnaise and salad dressings (reduced sodium). Avocado. Safflower, olive, or canola oils. Natural peanut or almond butter. Other Unsalted popcorn and pretzels. The items listed above may not be a complete list of recommended foods or beverages. Contact your dietitian for more options. WHAT FOODS ARE NOT RECOMMENDED? Grains White bread. White pasta. White rice. Refined cornbread. Bagels and croissants. Crackers that contain trans fat. Vegetables Creamed or fried vegetables. Vegetables in a cheese sauce. Regular canned vegetables. Regular canned tomato sauce and paste. Regular tomato and vegetable juices. Fruits Dried fruits. Canned fruit in light or heavy syrup. Fruit juice. Meat and Other Protein Products Fatty cuts of meat. Ribs, chicken wings, bacon, sausage, bologna, salami, chitterlings, fatback, hot dogs, bratwurst, and packaged luncheon meats. Salted nuts and seeds. Canned beans with salt. Dairy Whole or 2% milk, cream, half-and-half, and cream cheese. Whole-fat or sweetened yogurt. Full-fat   cheeses or blue cheese. Nondairy creamers and whipped toppings. Processed cheese, cheese spreads, or cheese  curds. Condiments Onion and garlic salt, seasoned salt, table salt, and sea salt. Canned and packaged gravies. Worcestershire sauce. Tartar sauce. Barbecue sauce. Teriyaki sauce. Soy sauce, including reduced sodium. Steak sauce. Fish sauce. Oyster sauce. Cocktail sauce. Horseradish. Ketchup and mustard. Meat flavorings and tenderizers. Bouillon cubes. Hot sauce. Tabasco sauce. Marinades. Taco seasonings. Relishes. Fats and Oils Butter, stick margarine, lard, shortening, ghee, and bacon fat. Coconut, palm kernel, or palm oils. Regular salad dressings. Other Pickles and olives. Salted popcorn and pretzels. The items listed above may not be a complete list of foods and beverages to avoid. Contact your dietitian for more information. WHERE CAN I FIND MORE INFORMATION? National Heart, Lung, [employer]: www.nhlbi.nih.gov/health/health-topics/topics/dash/   This information is not intended to replace advice given to you by your health care provider. Make sure you discuss any questions you have with your health care provider.   Document Released: 03/22/2011 Document Revised: 04/23/2014 Document Reviewed: 02/04/2013 Elsevier Interactive Patient Education 2016 [employer] Hypertension Hypertension, commonly called high blood pressure, is when the force of blood pumping through your arteries is too strong. Your arteries are the blood vessels that carry blood from your heart throughout your body. A blood pressure reading consists of a higher number over a lower number, such as 110/72. The higher number (systolic) is the pressure inside your arteries when your heart pumps. The lower number (diastolic) is the pressure inside your arteries when your heart relaxes. Ideally you want your blood pressure below 120/80. Hypertension forces your heart to work harder to pump blood. Your arteries may become narrow or stiff. Having untreated or uncontrolled hypertension can cause heart attack, stroke, kidney  disease, and other problems. RISK FACTORS Some risk factors for high blood pressure are controllable. Others are not.  Risk factors you cannot control include:   Race. You may be at higher risk if you are African American.  Age. Risk increases with age.  Gender. Men are at higher risk than women before age 45 years. After age 65, women are at higher risk than men. Risk factors you can control include:  Not getting enough exercise or physical activity.  Being overweight.  Getting too much fat, sugar, calories, or salt in your diet.  Drinking too much alcohol. SIGNS AND SYMPTOMS Hypertension does not usually cause signs or symptoms. Extremely high blood pressure (hypertensive crisis) may cause headache, anxiety, shortness of breath, and nosebleed. DIAGNOSIS To check if you have hypertension, your health care provider will measure your blood pressure while you are seated, with your arm held at the level of your heart. It should be measured at least twice using the same arm. Certain conditions can cause a difference in blood pressure between your right and left arms. A blood pressure reading that is higher than normal on one occasion does not mean that you need treatment. If it is not clear whether you have high blood pressure, you may be asked to return on a different day to have your blood pressure checked again. Or, you may be asked to monitor your blood pressure at home for 1 or more weeks. TREATMENT Treating high blood pressure includes making lifestyle changes and possibly taking medicine. Living a healthy lifestyle can help lower high blood pressure. You may need to change some of your habits. Lifestyle changes may include:  Following the DASH diet. This diet is high in fruits, vegetables, and whole grains.   It is low in salt, red meat, and added sugars.  Keep your sodium intake below 2,300 mg per day.  Getting at least 30-45 minutes of aerobic exercise at least 4 times per  week.  Losing weight if necessary.  Not smoking.  Limiting alcoholic beverages.  Learning ways to reduce stress. Your health care provider may prescribe medicine if lifestyle changes are not enough to get your blood pressure under control, and if one of the following is true:  You are 26-48 years of age and your systolic blood pressure is above 140.  You are 26 years of age or older, and your systolic blood pressure is above 150.  Your diastolic blood pressure is above 90.  You have diabetes, and your systolic blood pressure is over [id number] or your diastolic blood pressure is over 90.  You have kidney disease and your blood pressure is above 140/90.  You have heart disease and your blood pressure is above 140/90. Your personal target blood pressure may vary depending on your medical conditions, your age, and other factors. HOME CARE INSTRUCTIONS  Have your blood pressure rechecked as directed by your health care provider.   Take medicines only as directed by your health care provider. Follow the directions carefully. Blood pressure medicines must be taken as prescribed. The medicine does not work as well when you skip doses. Skipping doses also puts you at risk for problems.  Do not smoke.   Monitor your blood pressure at home as directed by your health care provider. SEEK MEDICAL CARE IF:   You think you are having a reaction to medicines taken.  You have recurrent headaches or feel dizzy.  You have swelling in your ankles.  You have trouble with your vision. SEEK IMMEDIATE MEDICAL CARE IF:  You develop a severe headache or confusion.  You have unusual weakness, numbness, or feel faint.  You have severe chest or abdominal pain.  You vomit repeatedly.  You have trouble breathing. MAKE SURE YOU:   Understand these instructions.  Will watch your condition.  Will get help right away if you are not doing well or get worse.   This information is not intended to  replace advice given to you by your health care provider. Make sure you discuss any questions you have with your health care provider.   Document Released: 04/02/2005 Document Revised: 08/17/2014 Document Reviewed: 01/23/2013 Elsevier Interactive Patient Education 2016 [employer]. Smoking Cessation, Tips for Success If you are ready to quit smoking, congratulations! You have chosen to help yourself be healthier. Cigarettes bring nicotine, tar, carbon monoxide, and other irritants into your body. Your lungs, heart, and blood vessels will be able to work better without these poisons. There are many different ways to quit smoking. Nicotine gum, nicotine patches, a nicotine inhaler, or nicotine nasal spray can help with physical craving. Hypnosis, support groups, and medicines help break the habit of smoking. WHAT THINGS CAN I DO TO MAKE QUITTING EASIER?  Here are some tips to help you quit for good:  Pick a date when you will quit smoking completely. Tell all of your friends and family about your plan to quit on that date.  Do not try to slowly cut down on the number of cigarettes you are smoking. Pick a quit date and quit smoking completely starting on that day.  Throw away all cigarettes.   Clean and remove all ashtrays from your home, work, and car.  On a card, write down your reasons for  quitting. Carry the card with you and read it when you get the urge to smoke.  Cleanse your body of nicotine. Drink enough water and fluids to keep your urine clear or pale yellow. Do this after quitting to flush the nicotine from your body.  Learn to predict your moods. Do not let a bad situation be your excuse to have a cigarette. Some situations in your life might tempt you into wanting a cigarette.  Never have "just one" cigarette. It leads to wanting another and another. Remind yourself of your decision to quit.  Change habits associated with smoking. If you smoked while driving or when feeling  stressed, try other activities to replace smoking. Stand up when drinking your coffee. Brush your teeth after eating. Sit in a different chair when you read the paper. Avoid alcohol while trying to quit, and try to drink fewer caffeinated beverages. Alcohol and caffeine may urge you to smoke.  Avoid foods and drinks that can trigger a desire to smoke, such as sugary or spicy foods and alcohol.  Ask people who smoke not to smoke around you.  Have something planned to do right after eating or having a cup of coffee. For example, plan to take a walk or exercise.  Try a relaxation exercise to calm you down and decrease your stress. Remember, you may be tense and nervous for the first 2 weeks after you quit, but this will pass.  Find new activities to keep your hands busy. Play with a pen, coin, or rubber band. Doodle or draw things on paper.  Brush your teeth right after eating. This will help cut down on the craving for the taste of tobacco after meals. You can also try mouthwash.   Use oral substitutes in place of cigarettes. Try using lemon drops, carrots, cinnamon sticks, or chewing gum. Keep them handy so they are available when you have the urge to smoke.  When you have the urge to smoke, try deep breathing.  Designate your home as a nonsmoking area.  If you are a heavy smoker, ask your health care provider about a prescription for nicotine chewing gum. It can ease your withdrawal from nicotine.  Reward yourself. Set aside the cigarette money you save and buy yourself something nice.  Look for support from others. Join a support group or smoking cessation program. Ask someone at home or at work to help you with your plan to quit smoking.  Always ask yourself, "Do I need this cigarette or is this just a reflex?" Tell yourself, "Today, I choose not to smoke," or "I do not want to smoke." You are reminding yourself of your decision to quit.  Do not replace cigarette smoking with  electronic cigarettes (commonly called e-cigarettes). The safety of e-cigarettes is unknown, and some may contain harmful chemicals.  If you relapse, do not give up! Plan ahead and think about what you will do the next time you get the urge to smoke. HOW WILL I FEEL WHEN I QUIT SMOKING? You may have symptoms of withdrawal because your body is used to nicotine (the addictive substance in cigarettes). You may crave cigarettes, be irritable, feel very hungry, cough often, get headaches, or have difficulty concentrating. The withdrawal symptoms are only temporary. They are strongest when you first quit but will go away within 10-14 days. When withdrawal symptoms occur, stay in control. Think about your reasons for quitting. Remind yourself that these are signs that your body is healing and getting used  to being without cigarettes. Remember that withdrawal symptoms are easier to treat than the major diseases that smoking can cause.  Even after the withdrawal is over, expect periodic urges to smoke. However, these cravings are generally short lived and will go away whether you smoke or not. Do not smoke! WHAT RESOURCES ARE AVAILABLE TO HELP ME QUIT SMOKING? Your health care provider can direct you to community resources or hospitals for support, which may include:  Group support.  Education.  Hypnosis.  Therapy.   This information is not intended to replace advice given to you by your health care provider. Make sure you discuss any questions you have with your health care provider.   Document Released: 12/30/2003 Document Revised: 04/23/2014 Document Reviewed: 09/18/2012 Elsevier Interactive Patient Education [employer].

## 2015-11-04 LAB — VITAMIN D 25 HYDROXY (VIT D DEFICIENCY, FRACTURES): Vit D, 25-Hydroxy: 27 ng/mL — ABNORMAL LOW (ref 30–100)

## 2015-11-09 ENCOUNTER — Telehealth: Payer: Self-pay | Admitting: Internal Medicine

## 2015-11-09 NOTE — Telephone Encounter (Signed)
Pt called the office asking to speak to nurse regarding lab results and if Dr. Doreene Burke will refill his vitamin D meds. Please f/u

## 2015-11-11 NOTE — Telephone Encounter (Signed)
-----   Message from Tresa Garter, MD sent at 11/09/2015 10:01 AM EDT ----- Lab results are within normal limit, Vitamin D level still slightly insufficient but showed significant improvement over previous result. Continue OTC Vit D supplement

## 2015-11-11 NOTE — Telephone Encounter (Signed)
Medical Assistant left message on patient's home and cell voicemail. Voicemail states to give a call back to Autumn Pruitt with [facility] at [phone].  

## 2015-11-14 NOTE — Telephone Encounter (Signed)
Patient verified DOB Patient is aware of Vitamin D level showing significant improvement thought stills lightly insufficient. Patient advised to continue OTC Vitamin D and the levels will be rechecked at the next visit. Patient expressed his understanding and had No further questions at this time.

## 2015-11-21 ENCOUNTER — Encounter: Payer: Self-pay | Admitting: *Deleted

## 2015-12-05 ENCOUNTER — Encounter: Payer: Self-pay | Admitting: Cardiology

## 2015-12-05 ENCOUNTER — Ambulatory Visit (INDEPENDENT_AMBULATORY_CARE_PROVIDER_SITE_OTHER): Payer: Managed Care, Other (non HMO) | Admitting: Cardiology

## 2015-12-05 VITALS — BP 86/60 | HR 67 | Ht 73.0 in | Wt 260.0 lb

## 2015-12-05 DIAGNOSIS — I255 Ischemic cardiomyopathy: Secondary | ICD-10-CM

## 2015-12-05 DIAGNOSIS — I25810 Atherosclerosis of coronary artery bypass graft(s) without angina pectoris: Secondary | ICD-10-CM | POA: Diagnosis not present

## 2015-12-05 DIAGNOSIS — I429 Cardiomyopathy, unspecified: Secondary | ICD-10-CM | POA: Diagnosis not present

## 2015-12-05 DIAGNOSIS — I259 Chronic ischemic heart disease, unspecified: Secondary | ICD-10-CM

## 2015-12-05 DIAGNOSIS — E785 Hyperlipidemia, unspecified: Secondary | ICD-10-CM

## 2015-12-05 DIAGNOSIS — I519 Heart disease, unspecified: Secondary | ICD-10-CM | POA: Diagnosis not present

## 2015-12-05 NOTE — Progress Notes (Signed)
Cardiology Office Note   Date:  12/05/2015   ID:  INIOLUWA BARIS, DOB [birth year], MRN [id number]  PCP:  Angelica Chessman, MD  Cardiologist: Lenorris Karger Martinique MD  Chief Complaint  Patient presents with  . Congestive Heart Failure  . Coronary Artery Disease       History of Present Illness: [name] is a 55 y.o. male who presents for follow up CAD and CHF. He is a former patient of Dr. Mare Ferrari.  This patient has a past history of ischemic heart disease. He had stents of the LCx and LAD in 2007 and is status post CABG by Dr. Roxan Hockey in 2014. In September 2014 when he presented with chest pain and cardiac cath showed a totally occluded right and a 50% in-stent restenosis of the circumflex stent. He had had previous stents to the LAD and circumflex in [place] in 2007. The patient was readmitted with recurrent anterolateral ST elevation in October 2014 and was found to have a large pericardial effusion with tamponade.. A pericardial drain was placed. He was almost ready for discharge when he developed signs of a stroke with left arm weakness and left visual field defect. He then developed hemodynamic collapse with hypotension and tachycardia and fever of 104 and required intubation. Repeat echo showed complex pericardial effusion with right ventricular collapse and tamponade and he responded to volume resuscitation and went on to have emergent median sternotomy, repair of ruptured inferoposterior left ventricular pseudoaneurysm with a core matrix patch and CABG x1 with saphenous vein graft to the obtuse marginal by Dr. Roxan Hockey. 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 Echo showed moderate pericardial effusion. He also had recurrent C. difficile colitis. His ejection fraction by echo was 25% and a sedimentation rate was elevated. He was discharged to skilled nursing  facility and subsequently was able to return home. He still has some residual weakness of his left arm but his range of motion has improved significantly. The patient had an echocardiogram on 11/26/13 showing ejection fraction 30%.. His most recent echocardiogram of 05/20/15 shows an ejection fraction of 30-35%. He was seen by Dr. Rayann Heman for consideration of ICD but the patient declined.  On follow up today he is doing very well. The patient is not having any exertional dyspnea or chest pain. He is not having any dizziness or syncope. He is working for [employer]. He states he has made significant dietary changes. He gets a lot of exercise. Still smokes an occasional cigarette.   Past Medical History:  Diagnosis Date  . Acute myopericarditis    a. 03/2013: readm with hypoxia, tachycardia, elevated ESR/CRP, elevated troponin with 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and myopericarditis; treated with steroids.  . Acute respiratory failure ([place])    a. 01/2013: VDRF. b. 03/2013: hypoxia requiring supp O2 during adm, resolved by discharge.  . Anemia due to acute blood loss    a. 01/2013 hospitalization (s/p surgery).  . C. difficile colitis    a. 01/2013 during prolonged adm. b. Recurred 03/2013.  . Cardiac tamponade    a. 01/2013 s/p drain.  . Coronary artery disease    a. s/p MI in 2009 in [place] with stenting of the LCX and LAD;  b. 12/2012 NSTEMI/CAD: LM nl, LAD patent prox stent, LCX 50-70 isr (FFR 0.84), RCA dom, 146m EF 40-45%-->Med Rx. c. 01/2013: anterolateral STEMI complicated by pericardial effusion (presumed purulent pericarditis) and  tamponade s/p drain, ruptured LV pseudoaneurysm s/p CorMatrix patch, CABGx1 (SVG-OM1), fever, CVA, VDRF, C diff.  . CVA (cerebral infarction)    a. 01/2013 in setting of prolonged hospitalization - residual L arm weakness.  . 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 03/2013: readm with hypoxia, tachycardia, elevated ESR/CRP, elevated troponin with 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and  myopericarditis; treated with steroids.  . Hyperlipidemia   . Hypokalemia   . Hyponatremia    a. During late 2014.  . Ischemic cardiomyopathy    a. Sept/Oct 2014: EF ~40% (ICM). b. 03/2013: EF 25-30%. Off ACEI due to hypotension (MIXED NICM/ICM).  . Pericardial effusion    a. 01/2013: pericardial effusion (presumed purulent pericarditis) and cardiac tamponade s/p drain. b. Persistent moderate pericardial effusion 03/2013.  . Pseudoaneurysm of left ventricle of heart    a. 01/2013:  Ruptured inferoposterior LV pseudoaneurysm s/p CorMatrix patch.  . Sinus bradycardia    asymptomatic  . Stroke ([place])    weak lt side-lt arm  . Tobacco abuse    a. quit 12/2012.  Marland Kitchen Vision loss of right eye   . Wears dentures    top    Past Surgical History:  Procedure Laterality Date  . CARDIAC CATHETERIZATION  01/06/2013  . CORONARY ANGIOPLASTY WITH STENT PLACEMENT  2000's X 2   "1 + 1" (01/06/2013)  . CORONARY ARTERY BYPASS GRAFT N/A 01/28/2013   Procedure: CORONARY ARTERY BYPASS GRAFTING (CABG);  Surgeon: Melrose Nakayama, MD;  Location: [facility];  Service: Open Heart Surgery;  Laterality: N/A;  CABG x one, using left greater saphenous vein harvested endoscopically  . INTRAOPERATIVE TRANSESOPHAGEAL ECHOCARDIOGRAM N/A 01/28/2013   Procedure: INTRAOPERATIVE TRANSESOPHAGEAL ECHOCARDIOGRAM;  Surgeon: Melrose Nakayama, MD;  Location: [facility];  Service: Open Heart Surgery;  Laterality: N/A;  . LEFT HEART CATHETERIZATION WITH CORONARY ANGIOGRAM N/A 01/06/2013   Procedure: LEFT HEART CATHETERIZATION WITH CORONARY ANGIOGRAM;  Surgeon: Kyren Knick M Martinique, MD;  Location: [facility] CATH LAB;  Service: Cardiovascular;  Laterality: N/A;  . LEFT HEART CATHETERIZATION WITH CORONARY ANGIOGRAM N/A 01/21/2013   Procedure: LEFT HEART CATHETERIZATION WITH CORONARY ANGIOGRAM;  Surgeon: Burnell Blanks, MD;  Location: [facility] CATH LAB;  Service: Cardiovascular;  Laterality: N/A;  . MASS EXCISION Right 07/21/2014   Procedure: EXCISION  RIGHT NECK MASS;  Surgeon: Erroll Luna, MD;  Location: [facility];  Service: General;  Laterality: Right;  . PERICARDIAL TAP N/A 01/24/2013   Procedure: PERICARDIAL TAP;  Surgeon: Blane Ohara, MD;  Location: [facility] CATH LAB;  Service: Cardiovascular;  Laterality: N/A;  . RIGHT HEART CATHETERIZATION Right 01/24/2013   Procedure: RIGHT HEART CATH;  Surgeon: Blane Ohara, MD;  Location: [facility] CATH LAB;  Service: Cardiovascular;  Laterality: Right;  . VENTRICULAR ANEURYSM RESECTION N/A 01/28/2013   Procedure: LEFT VENTRICULAR ANEURYSM REPAIR;  Surgeon: Melrose Nakayama, MD;  Location: [facility];  Service: Open Heart Surgery;  Laterality: N/A;     Current Outpatient Prescriptions  Medication Sig Dispense Refill  . aspirin 81 MG EC tablet Take 1 tablet (81 mg total) by mouth daily. 90 tablet 3  . carvedilol (COREG) 25 MG tablet TAKE 1 TABLET BY MOUTH 2 TIMES DAILY WITH A MEAL. 60 tablet 5  . clopidogrel (PLAVIX) 75 MG tablet TAKE 1 TABLET BY MOUTH DAILY. 90 tablet 0  . famotidine (PEPCID) 20 MG tablet Take 1 tablet (20 mg total) by mouth 2 (two) times daily. 30 tablet 0  . HYDROcodone-acetaminophen (NORCO) 5-325 MG per tablet Take 1 tablet by  mouth every 6 (six) hours as needed for moderate pain. 30 tablet 0  . lisinopril (PRINIVIL,ZESTRIL) 10 MG tablet Take 1 tablet (10 mg total) by mouth 2 (two) times daily. 60 tablet 5  . Multiple Vitamins-Minerals (CENTRUM ADULTS PO) Take 1 tablet by mouth daily.     . nitroGLYCERIN (NITROSTAT) 0.4 MG SL tablet Place 1 tablet (0.4 mg total) under the tongue every 5 (five) minutes as needed for chest pain (up to 3 doses). 25 tablet 0  . simvastatin (ZOCOR) 40 MG tablet TAKE 1 TABLET BY MOUTH DAILY AT 6 PM. 30 tablet 3  . Vitamin D, Ergocalciferol, (DRISDOL) 50000 units CAPS capsule Take 1 capsule (50,000 Units total) by mouth every 7 (seven) days. 12 capsule 0  . zoster vaccine live, PF, (ZOSTAVAX) 72094 UNT/0.65ML injection Inject 19,400 Units  into the skin once. 1 each 0   No current facility-administered medications for this visit.     Allergies:   Review of patient's allergies indicates no known allergies.    Social History:  The patient  reports that he has been smoking Cigarettes.  He has a 17.50 pack-year smoking history. He has never used smokeless tobacco. He reports that he does not drink alcohol or use drugs.   Family History:  The patient's family history includes Diabetes in his mother; Heart attack in his mother; Hypertension in his father and mother.    ROS:  Please see the history of present illness.   Otherwise, review of systems are positive for none.   All other systems are reviewed and negative.    PHYSICAL EXAM: VS:  BP (!) 86/60   Pulse 67   Ht _0  (1.854 m)   Wt 260 lb (117.9 kg)   BMI 34.30 kg/m  , BMI Body mass index is 34.3 kg/m. GEN: Well nourished, well developed, in no acute distress  HEENT: normal  Neck: no JVD, carotid bruits, or masses Cardiac: RRR; no murmurs, rubs, or gallops,no edema  Respiratory:  clear to auscultation bilaterally, normal work of breathing GI: soft, nontender, nondistended, + BS MS: no deformity or atrophy  Skin: warm and dry, no rash Neuro:  Strength and sensation are intact Psych: euthymic mood, full affect   EKG:  EKG is not ordered today.    Recent Labs: 11/03/2015: ALT 24; BUN 11; Creat 0.90; Potassium 4.4; Sodium 140    Lipid Panel    Component Value Date/Time   CHOL 156 11/03/2015 1609   TRIG 97 11/03/2015 1609   HDL 62 11/03/2015 1609   CHOLHDL 2.5 11/03/2015 1609   VLDL 19 11/03/2015 1609   LDLCALC 75 11/03/2015 1609      Wt Readings from Last 3 Encounters:  12/05/15 260 lb (117.9 kg)  11/03/15 258 lb 9.6 oz (117.3 kg)  06/03/15 252 lb 9.6 oz (114.6 kg)         ASSESSMENT AND PLAN:  1. Ischemic heart disease status post remote stenting of the LAD and LCx in 2007. s/p CABG in October 2014 4 repair of ruptured inferoposterior  left ventricular pseudoaneurysm into the pericardial sac with resultant pericardial tamponade and shock. 2. History of Dressler's myopericarditis postoperative 3. History of preoperative stroke with left arm weakness and left visual field defect 4. Essential hypertension- controlled.  5. Ischemic cardiomyopathy with left ventricular ejection fraction 30-35% by echocardiogram 05/20/15. 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 He is on optimal medical therapy. He reports his blood  pressure tends to run low and he is asymptomatic so we will continue same meds.   Current medicines are reviewed at length with the patient today.  The patient has concerns regarding medicines.  The following changes have been made:  none  Labs/ tests ordered today include:   No orders of the defined types were placed in this encounter.  Signed, Pravin Perezperez Martinique MD, [facility]   12/05/2015 10:18 AM    [place]

## 2015-12-05 NOTE — Patient Instructions (Signed)
Continue your current therapy  I will see you  In 6 months   

## 2015-12-20 ENCOUNTER — Other Ambulatory Visit: Payer: Self-pay | Admitting: Cardiology

## 2015-12-20 MED ORDER — CARVEDILOL 25 MG PO TABS: 25.0000 mg | ORAL_TABLET | Freq: Two times a day (BID) | ORAL | 11 refills | Status: DC

## 2015-12-20 NOTE — Telephone Encounter (Signed)
Rx(s) sent to pharmacy electronically.  

## 2015-12-20 NOTE — Telephone Encounter (Signed)
New message     *STAT* If patient is at the pharmacy, call can be transferred to refill team.   1. Which medications need to be refilled? (please list name of each medication and dose if known) carvedilol  25 mg   2. Which pharmacy/location (including street and city if local pharmacy) is medication to be sent to? [facility] [address]   3. Do they need a 30 day or 90 day supply? 30 days supply

## 2015-12-21 ENCOUNTER — Other Ambulatory Visit: Payer: Self-pay | Admitting: Cardiology

## 2015-12-21 MED ORDER — CARVEDILOL 25 MG PO TABS: 25.0000 mg | ORAL_TABLET | Freq: Two times a day (BID) | ORAL | 3 refills | Status: DC

## 2015-12-21 NOTE — Telephone Encounter (Signed)
Rx(s) sent to pharmacy electronically.  

## 2015-12-21 NOTE — Telephone Encounter (Signed)
New message     *STAT* If patient is at the pharmacy, call can be transferred to refill team.   1. Which medications need to be refilled? (please list name of each medication and dose if known) carvedilol 25 mg   2. Which pharmacy/location (including street and city if local pharmacy) is medication to be sent to? [facility] [address]   3. Do they need a 30 day or 90 day supply? 90 day

## 2015-12-23 ENCOUNTER — Telehealth: Payer: Self-pay | Admitting: Cardiology

## 2015-12-23 NOTE — Telephone Encounter (Signed)
Returned call to patient-pt states he needs new rx [facility]-made aware that new rx was sent on 9/6 to preferred pharmacy.    Pt was unaware and verbalized understanding.

## 2015-12-23 NOTE — Telephone Encounter (Signed)
New Message   *STAT* If patient is at the pharmacy, call can be transferred to refill team.   1. Which medications need to be refilled? (please list name of each medication and dose if known) Carvedilol 25mg   2. Which pharmacy/location (including street and city if local pharmacy) is medication to be sent to? [facility] [address]  3. Do they need a 30 day or 90 day supply? [place]

## 2016-02-19 ENCOUNTER — Other Ambulatory Visit: Payer: Self-pay | Admitting: Cardiology

## 2016-02-20 ENCOUNTER — Other Ambulatory Visit: Payer: Self-pay | Admitting: *Deleted

## 2016-02-20 NOTE — Telephone Encounter (Signed)
Rx request sent to pharmacy.  

## 2016-03-01 ENCOUNTER — Other Ambulatory Visit: Payer: Self-pay

## 2016-03-01 MED ORDER — LISINOPRIL 10 MG PO TABS: 10.0000 mg | ORAL_TABLET | Freq: Two times a day (BID) | ORAL | 10 refills | Status: DC

## 2016-03-01 MED ORDER — SIMVASTATIN 40 MG PO TABS: 40.0000 mg | ORAL_TABLET | Freq: Every day | ORAL | 10 refills | Status: DC

## 2016-03-06 ENCOUNTER — Other Ambulatory Visit: Payer: Self-pay | Admitting: *Deleted

## 2016-03-06 MED ORDER — LISINOPRIL 10 MG PO TABS: 10.0000 mg | ORAL_TABLET | Freq: Two times a day (BID) | ORAL | 3 refills | Status: DC

## 2016-03-22 ENCOUNTER — Encounter: Payer: Self-pay | Admitting: Internal Medicine

## 2016-03-22 ENCOUNTER — Ambulatory Visit: Payer: Managed Care, Other (non HMO) | Attending: Internal Medicine | Admitting: Internal Medicine

## 2016-03-22 VITALS — BP 106/67 | HR 64 | Temp 98.1°F | Resp 18 | Ht 73.0 in | Wt 257.6 lb

## 2016-03-22 DIAGNOSIS — Z8673 Personal history of transient ischemic attack (TIA), and cerebral infarction without residual deficits: Secondary | ICD-10-CM | POA: Diagnosis not present

## 2016-03-22 DIAGNOSIS — I259 Chronic ischemic heart disease, unspecified: Secondary | ICD-10-CM

## 2016-03-22 DIAGNOSIS — I2510 Atherosclerotic heart disease of native coronary artery without angina pectoris: Secondary | ICD-10-CM | POA: Diagnosis not present

## 2016-03-22 DIAGNOSIS — E785 Hyperlipidemia, unspecified: Secondary | ICD-10-CM | POA: Insufficient documentation

## 2016-03-22 DIAGNOSIS — I255 Ischemic cardiomyopathy: Secondary | ICD-10-CM | POA: Diagnosis not present

## 2016-03-22 DIAGNOSIS — Z7982 Long term (current) use of aspirin: Secondary | ICD-10-CM | POA: Diagnosis not present

## 2016-03-22 DIAGNOSIS — M79644 Pain in right finger(s): Secondary | ICD-10-CM | POA: Diagnosis present

## 2016-03-22 DIAGNOSIS — F17210 Nicotine dependence, cigarettes, uncomplicated: Secondary | ICD-10-CM | POA: Diagnosis not present

## 2016-03-22 DIAGNOSIS — I10 Essential (primary) hypertension: Secondary | ICD-10-CM | POA: Diagnosis not present

## 2016-03-22 DIAGNOSIS — Z79899 Other long term (current) drug therapy: Secondary | ICD-10-CM | POA: Diagnosis not present

## 2016-03-22 DIAGNOSIS — Z951 Presence of aortocoronary bypass graft: Secondary | ICD-10-CM | POA: Insufficient documentation

## 2016-03-22 DIAGNOSIS — F17200 Nicotine dependence, unspecified, uncomplicated: Secondary | ICD-10-CM | POA: Diagnosis not present

## 2016-03-22 DIAGNOSIS — I252 Old myocardial infarction: Secondary | ICD-10-CM | POA: Diagnosis not present

## 2016-03-22 DIAGNOSIS — Z1211 Encounter for screening for malignant neoplasm of colon: Secondary | ICD-10-CM | POA: Diagnosis not present

## 2016-03-22 DIAGNOSIS — M65311 Trigger thumb, right thumb: Secondary | ICD-10-CM | POA: Insufficient documentation

## 2016-03-22 NOTE — Progress Notes (Signed)
[name], is a 55 y.o. male  KPV:[id number]  [id number]  DOB - Nov 28, 1960  Chief Complaint  Patient presents with  . Hand Pain    Right Thumb       Subjective:   [name] is a 55 y.o. male with multiple medical history including CAD, ischemic CM, s/p large MI in 2014 with ruptured LV pseudoaneurysm requiring emergent surgery and CABG here today for a follow up visit. His most recent echocardiogram shows persistent low Ventricular ejection fraction of about 35%. Patient has no symptoms. He has no complaint today. No leg swelling. Denies shortness of breath. No chest pain. Patient has been referred to electrophysiologist to decide if he needs an ICD or to continue observation. Patient continues to smoke cigarettes but he said he is trying to quit, now down to about 5 cigarettes per day. He does not need this medication refilled. 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 Patient has No headache, No abdominal pain - No Nausea, No new weakness tingling or numbness, No Cough - SOB. He has noticed  locking and clicking of his right thumb and sometimes he has to use his other pull it back up for the past 4 months but has no other associated symptoms like pain or tingling or numbness.  Problem  Trigger Finger of Right Thumb   ALLERGIES: No Known Allergies  PAST MEDICAL HISTORY: Past Medical History:  Diagnosis Date  . Acute myopericarditis    a. 03/2013: readm with hypoxia, tachycardia, elevated ESR/CRP, elevated troponin with 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and myopericarditis; treated with steroids.  . Acute respiratory failure ([place])    a. 01/2013: VDRF. b. 03/2013: hypoxia requiring supp O2 during adm, resolved by discharge.  . Anemia due to acute blood loss    a. 01/2013 hospitalization (s/p surgery).  . C. difficile colitis    a. 01/2013 during prolonged adm. b. Recurred 03/2013.  . Cardiac tamponade    a.  01/2013 s/p drain.  . Coronary artery disease    a. s/p MI in 2009 in [place] with stenting of the LCX and LAD;  b. 12/2012 NSTEMI/CAD: LM nl, LAD patent prox stent, LCX 50-70 isr (FFR 0.84), RCA dom, 156m EF 40-45%-->Med Rx. c. 01/2013: anterolateral STEMI complicated by pericardial effusion (presumed purulent pericarditis) and tamponade s/p drain, ruptured LV pseudoaneurysm s/p CorMatrix patch, CABGx1 (SVG-OM1), fever, CVA, VDRF, C diff.  . CVA (cerebral infarction)    a. 01/2013 in setting of prolonged hospitalization - residual L arm weakness.  . 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([place]    a. 03/2013: readm with hypoxia, tachycardia, elevated ESR/CRP, elevated troponin with 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and myopericarditis; treated with steroids.  . Hyperlipidemia   . Hypokalemia   . Hyponatremia    a. During late 2014.  . Ischemic cardiomyopathy    a. Sept/Oct 2014: EF ~40% (ICM). b. 03/2013: EF 25-30%. Off ACEI due to hypotension (MIXED NICM/ICM).  . Pericardial effusion    a. 01/2013: pericardial effusion (presumed purulent pericarditis) and cardiac tamponade s/p drain. b. Persistent moderate pericardial effusion 03/2013.  . Pseudoaneurysm of left ventricle of heart    a. 01/2013:  Ruptured inferoposterior LV pseudoaneurysm s/p CorMatrix patch.  . Sinus bradycardia    asymptomatic  . [place]    weak lt side-lt arm  . Tobacco abuse    a. quit 12/2012.  .Marland KitchenVision loss of right eye   . Wears dentures  top    MEDICATIONS AT HOME: Prior to Admission medications   Medication Sig Start Date End Date Taking? Authorizing Provider  aspirin 81 MG EC tablet Take 1 tablet (81 mg total) by mouth daily. 06/22/13  Yes Tresa Garter, MD  carvedilol (COREG) 25 MG tablet Take 1 tablet (25 mg total) by mouth 2 (two) times daily with a meal. 12/21/15  Yes Peter M Martinique, MD  clopidogrel (PLAVIX) 75 MG tablet TAKE 1 TABLET BY MOUTH EVERY DAY 02/20/16  Yes Peter M Martinique, MD  lisinopril (PRINIVIL,ZESTRIL) 10 MG tablet Take 1  tablet (10 mg total) by mouth 2 (two) times daily. 03/06/16  Yes Peter M Martinique, MD  Multiple Vitamins-Minerals (CENTRUM ADULTS PO) Take 1 tablet by mouth daily.    Yes Historical Provider, MD  nitroGLYCERIN (NITROSTAT) 0.4 MG SL tablet Place 1 tablet (0.4 mg total) under the tongue every 5 (five) minutes as needed for chest pain (up to 3 doses). 04/17/13  Yes Darlin Coco, MD  simvastatin (ZOCOR) 40 MG tablet Take 1 tablet (40 mg total) by mouth daily at 6 PM. 03/01/16  Yes Peter M Martinique, MD  zoster vaccine live, PF, (ZOSTAVAX) 39030 UNT/0.65ML injection Inject 19,400 Units into the skin once. 08/24/14  Yes Tresa Garter, MD  Vitamin D, Ergocalciferol, (DRISDOL) 50000 units CAPS capsule Take 1 capsule (50,000 Units total) by mouth every 7 (seven) days. Patient not taking: Reported on 03/22/2016 04/15/15   Tresa Garter, MD    Objective:   Vitals:   03/22/16 0944  BP: 106/67  Pulse: 64  Resp: 18  Temp: 98.1 F (36.7 C)  TempSrc: Oral  SpO2: 97%  Weight: 257 lb 9.6 oz (116.8 kg)  Height: 6' 1"  (1.854 m)   Exam General appearance : Awake, alert, not in any distress. Speech Clear. Not toxic looking HEENT: Atraumatic and Normocephalic, pupils equally reactive to light and accomodation Neck: Supple, no JVD. No cervical lymphadenopathy.  Chest: Good air entry bilaterally, no added sounds  CVS: S1 S2 regular, no murmurs.  Abdomen: Bowel sounds present, Non tender and not distended with no gaurding, rigidity or rebound. Extremities: B/L Lower Ext shows no edema, both legs are warm to touch Neurology: Awake alert, and oriented X 3, CN II-XII intact, Non focal Skin: No Rash  Data Review Lab Results  Component Value Date   HGBA1C 5.4 06/22/2013   HGBA1C 5.7 (H) 01/05/2013    Assessment & Plan   1. Essential hypertension  We have discussed target BP range and blood pressure goal. I have advised patient to check BP regularly and to call us back or report to clinic if the  numbers are consistently higher than 140/90. We discussed the importance of compliance with medical therapy and DASH diet recommended, consequences of uncontrolled hypertension discussed.  - continue current BP medications  2. Dyslipidemia  To address this please limit saturated fat to no more than 7% of your calories, limit cholesterol to 200 mg/day, increase fiber and exercise as tolerated. If needed we may add another cholesterol lowering medication to your regimen.   3. Smoking addiction  Constant was counseled on the dangers of tobacco use, and was advised to quit. Reviewed strategies to maximize success, including removing cigarettes and smoking materials from environment, stress management and support of family/friends.  4. Trigger finger of right thumb  Splinting for 6-8 weeks may be helpful.  Nonsteroidal anti-inflammatory medicines OTC  5. Colon cancer screening  - Ambulatory referral to Gastroenterology  Patient have been counseled extensively about nutrition and exercise. Other issues discussed during this visit include: low cholesterol diet, weight control and daily exercise, foot care, annual eye examinations at Ophthalmology, importance of adherence with medications and regular follow-up. We also discussed long term complications of uncontrolled hypertension.   Return in about 3 months (around 06/20/2016) for Heart Failure and Hypertension.  The patient was given clear instructions to go to ER or return to medical center if symptoms don't improve, worsen or new problems develop. The patient verbalized understanding. The patient was told to call to get lab results if they haven't heard anything in the next week.   This note has been created with Surveyor, quantity. Any transcriptional errors are unintentional.   Angelica Chessman, MD, [place], [place], [place], [place] and [place],  [place]   03/22/2016, 10:30 AM

## 2016-03-22 NOTE — Progress Notes (Signed)
Patient is here for FU  Patient complains of right thumb locking and cracking when being bent for the past 4 months.  Patient has taken medication today. Patient has eaten today.  Patient declined the flu vaccine today.

## 2016-03-22 NOTE — Patient Instructions (Addendum)
Trigger Finger Trigger finger (digital tendinitis and stenosing tenosynovitis) is a common disorder that causes an often painful catching of the fingers or thumb. It occurs as a clicking, snapping, or locking of a finger in the palm of the hand. This is caused by a problem with the tendons that flex or bend the fingers sliding smoothly through their sheaths. The condition may occur in any finger or a couple fingers at the same time.  The finger may lock with the finger curled or suddenly straighten out with a snap. This is more common in patients with rheumatoid arthritis and diabetes. Left untreated, the condition may get worse to the point where the finger becomes locked in flexion, like making a fist, or less commonly locked with the finger straightened out. CAUSES   Inflammation and scarring that lead to swelling around the tendon sheath.  Repeated or forceful movements.  Rheumatoid arthritis, an autoimmune disease that affects joints.  Gout.  Diabetes mellitus. SIGNS AND SYMPTOMS  Soreness and swelling of your finger.  A painful clicking or snapping as you bend and straighten your finger. DIAGNOSIS  Your health care provider will do a physical exam of your finger to diagnose trigger finger. TREATMENT   Splinting for 6-8 weeks may be helpful.  Nonsteroidal anti-inflammatory medicines (NSAIDs) can help to relieve the pain and inflammation.  Cortisone injections, along with splinting, may speed up recovery. Several injections may be required. Cortisone may give relief after one injection.  Surgery is another treatment that may be used if conservative treatments do not work. Surgery can be minor, without incisions (a cut does not have to be made), and can be done with a needle through the skin. Other surgical choices involve an ope Hypertension Hypertension, commonly called high blood pressure, is when the force of blood pumping through your arteries is too strong. Your arteries are  the blood vessels that carry blood from your heart throughout your body. A blood pressure reading consists of a higher number over a lower number, such as 110/72. The higher number (systolic) is the pressure inside your arteries when your heart pumps. The lower number (diastolic) is the pressure inside your arteries when your heart relaxes. Ideally you want your blood pressure below 120/80. Hypertension forces your heart to work harder to pump blood. Your arteries may become narrow or stiff. Having untreated or uncontrolled hypertension can cause heart attack, stroke, kidney disease, and other problems. What increases the risk? Some risk factors for high blood pressure are controllable. Others are not. Risk factors you cannot control include: Race. You may be at higher risk if you are African American. Age. Risk increases with age. Gender. Men are at higher risk than women before age 47 years. After age 97, women are at higher risk than men. Risk factors you can control include: Not getting enough exercise or physical activity. Being overweight. Getting too much fat, sugar, calories, or salt in your diet. Drinking too much alcohol. What are the signs or symptoms? Hypertension does not usually cause signs or symptoms. Extremely high blood pressure (hypertensive crisis) may cause headache, anxiety, shortness of breath, and nosebleed. How is this diagnosed? To check if you have hypertension, your health care provider will measure your blood pressure while you are seated, with your arm held at the level of your heart. It should be measured at least twice using the same arm. Certain conditions can cause a difference in blood pressure between your right and left arms. A blood pressure reading  that is higher than normal on one occasion does not mean that you need treatment. If it is not clear whether you have high blood pressure, you may be asked to return on a different day to have your blood pressure  checked again. Or, you may be asked to monitor your blood pressure at home for 1 or more weeks. How is this treated? Treating high blood pressure includes making lifestyle changes and possibly taking medicine. Living a healthy lifestyle can help lower high blood pressure. You may need to change some of your habits. Lifestyle changes may include: Following the DASH diet. This diet is high in fruits, vegetables, and whole grains. It is low in salt, red meat, and added sugars. Keep your sodium intake below 2,300 mg per day. Getting at least 30-45 minutes of aerobic exercise at least 4 times per week. Losing weight if necessary. Not smoking. Limiting alcoholic beverages. Learning ways to reduce stress. Your health care provider may prescribe medicine if lifestyle changes are not enough to get your blood pressure under control, and if one of the following is true: You are 61-40 years of age and your systolic blood pressure is above 140. You are 21 years of age or older, and your systolic blood pressure is above 150. Your diastolic blood pressure is above 90. You have diabetes, and your systolic blood pressure is over [id number] or your diastolic blood pressure is over 90. You have kidney disease and your blood pressure is above 140/90. You have heart disease and your blood pressure is above 140/90. Your personal target blood pressure may vary depending on your medical conditions, your age, and other factors. Follow these instructions at home: Have your blood pressure rechecked as directed by your health care provider. Take medicines only as directed by your health care provider. Follow the directions carefully. Blood pressure medicines must be taken as prescribed. The medicine does not work as well when you skip doses. Skipping doses also puts you at risk for problems. Do not smoke. Monitor your blood pressure at home as directed by your health care provider. Contact a health care provider if: You think  you are having a reaction to medicines taken. You have recurrent headaches or feel dizzy. You have swelling in your ankles. You have trouble with your vision. Get help right away if: You develop a severe headache or confusion. You have unusual weakness, numbness, or feel faint. You have severe chest or abdominal pain. You vomit repeatedly. You have trouble breathing. This information is not intended to replace advice given to you by your health care provider. Make sure you discuss any questions you have with your health care provider. Document Released: 04/02/2005 Document Revised: 09/08/2015 Document Reviewed: 01/23/2013 Elsevier Interactive Patient Education  2017 [employer].  n procedure in which the surgeon opens the hand through a small incision and cuts the pulley so the tendon can again slide smoothly. Your hand will still work fine. HOME CARE INSTRUCTIONS  Apply ice to the injured area, twice per day:  Put ice in a plastic bag.  Place a towel between your skin and the bag.  Leave the ice on for 20 minutes, 3-4 times a day.  Rest your hand often. MAKE SURE YOU:   Understand these instructions.  Will watch your condition.  Will get help right away if you are not doing well or get worse. This information is not intended to replace advice given to you by your health care provider. Make sure you  discuss any questions you have with your health care provider. Document Released: 01/21/2004 Document Revised: 12/03/2012 Document Reviewed: 09/02/2012 Elsevier Interactive Patient Education  2017 [employer].

## 2016-03-26 ENCOUNTER — Encounter: Payer: Self-pay | Admitting: Physician Assistant

## 2016-04-04 ENCOUNTER — Encounter: Payer: Self-pay | Admitting: Physician Assistant

## 2016-04-04 ENCOUNTER — Ambulatory Visit (INDEPENDENT_AMBULATORY_CARE_PROVIDER_SITE_OTHER): Payer: Managed Care, Other (non HMO) | Admitting: Physician Assistant

## 2016-04-04 VITALS — BP 90/70 | HR 71 | Ht 73.0 in | Wt 258.0 lb

## 2016-04-04 DIAGNOSIS — Z1212 Encounter for screening for malignant neoplasm of rectum: Secondary | ICD-10-CM | POA: Diagnosis not present

## 2016-04-04 DIAGNOSIS — Z1211 Encounter for screening for malignant neoplasm of colon: Secondary | ICD-10-CM

## 2016-04-04 NOTE — Patient Instructions (Signed)
We have sent your demographic and insurance information to [facility]. They should contact you within the next week regarding your Cologuard (colon cancer screening) test. If you have not heard from them within the next week, please call our office at [phone]. 

## 2016-04-04 NOTE — Progress Notes (Signed)
Subjective:    Patient ID: [name], male    DOB: 1960/06/30, 55 y.o.   MRN: [id number]  HPI Paulmichael is a pleasant 55 year old African-American male, new to GI today referred by Dr. Angelica Chessman for colon cancer screening. Patient has not had any prior GI history and no prior colonoscopy. He has no family history of colon cancer or polyps that he is aware of. He says he has absolutely no GI symptoms other than having hard stools. She complaints of abdominal pain or discomfort, no recent changes in bowel habits no melena or hematochezia. No regular heartburn or indigestion. Patient does have significant comorbidities with history of coronary artery disease status post MI. He had stents placed in 2007 and then had CABG in 2014 at which point he also had developed a myopericarditis, tamponade and has a cardiomyopathy with most recent EF of 30-35%. He had a CVA about 2 years ago. Also prior history of C. difficile. He is currently maintained on Plavix and aspirin.  Review of Systems Pertinent positive and negative review of systems were noted in the above HPI section.  All other review of systems was otherwise negative.  Outpatient Encounter Prescriptions as of 04/04/2016  Medication Sig  . aspirin 81 MG EC tablet Take 1 tablet (81 mg total) by mouth daily.  . carvedilol (COREG) 25 MG tablet Take 1 tablet (25 mg total) by mouth 2 (two) times daily with a meal.  . clopidogrel (PLAVIX) 75 MG tablet TAKE 1 TABLET BY MOUTH EVERY DAY  . lisinopril (PRINIVIL,ZESTRIL) 10 MG tablet Take 1 tablet (10 mg total) by mouth 2 (two) times daily.  . Multiple Vitamins-Minerals (CENTRUM ADULTS PO) Take 1 tablet by mouth daily.   . nitroGLYCERIN (NITROSTAT) 0.4 MG SL tablet Place 1 tablet (0.4 mg total) under the tongue every 5 (five) minutes as needed for chest pain (up to 3 doses).  . simvastatin (ZOCOR) 40 MG tablet Take 1 tablet (40 mg total) by mouth daily at 6 PM.  . Vitamin D, Ergocalciferol,  (DRISDOL) 50000 units CAPS capsule Take 1 capsule (50,000 Units total) by mouth every 7 (seven) days.  Marland Kitchen zoster vaccine live, PF, (ZOSTAVAX) 16109 UNT/0.65ML injection Inject 19,400 Units into the skin once.   No facility-administered encounter medications on file as of 04/04/2016.    No Known Allergies Patient Active Problem List   Diagnosis Date Noted  . Trigger finger of right thumb 03/22/2016  . Essential hypertension 11/03/2015  . Cardiomyopathy, ischemic 06/03/2015  . Chronic systolic dysfunction of left ventricle 06/03/2015  . Erectile dysfunction due to arterial insufficiency 12/23/2014  . Colon cancer screening 05/24/2014  . Lipoma of skin and subcutaneous tissue of neck 05/24/2014  . History of cardioembolic stroke [id number]  . Vision loss of right eye 06/23/2013  . Other primary cardiomyopathies 06/22/2013  . ST elevation myocardial infarction (STEMI), subsequent to initial episode of care ([place]) 06/22/2013  . Dyslipidemia 06/22/2013  . Smoking addiction 06/22/2013  . Physical deconditioning 03/25/2013  . Acute respiratory failure ([place])   . 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 Acute myopericarditis   . Pericardial effusion   . Coronary artery disease   . Cardiomyopathy ([place])   . Dysphagia   . C. difficile colitis   . Orthostatic hypotension   . Hyponatremia   . CVA (cerebral infarction)   . Anemia due to acute blood loss   . Pseudoaneurysm of left ventricle of heart   . Cardiac tamponade   . Tobacco abuse   .  Leucocytosis 02/05/2013  . Acute blood loss anemia 02/05/2013  . Hypokalemia 01/07/2013  . Non-STEMI (non-ST elevated myocardial infarction) ([place]) 01/05/2013   Social History   Social History  . Marital status: Single    Spouse name: N/A  . Number of children: 2  . Years of education: 12th   Occupational History  . N/A    Social History Main Topics  . Smoking status: Current Every Day Smoker    Packs/day: 0.50    Years: 35.00    Types: Cigarettes  .  Smokeless tobacco: Never Used     Comment: smokes 10-12 cigarettes per week  . Alcohol use No  . Drug use: No  . Sexual activity: Yes   Other Topics Concern  . Not on file   Social History Narrative   Lives in [place].  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  Quit smoking after MI 12/2012.   Caffeine Use: very rarely    Mr. Midland family history includes Diabetes in his mother; Heart attack in his mother; Hypertension in his father and mother.      Objective:    Vitals:   04/04/16 1039  BP: 90/70  Pulse: 71    Physical Exam  well-developed African-American male in no acute distress, very pleasant blood pressure 90/70 pulse 71, Height 6 foot 1 weight 258 BMI 34. HEENT; nontraumatic normocephalic EOMI PERRLA sclera anicteric, Cardiovascular ;regular rate and rhythm with S1-S2 no murmur or gallop, sternal incisional scar, Pulmonary; clear bilaterally, Abdomen ;soft nontender nondistended bowel sounds are active is no palpable mass or hepatosplenomegaly, Rectal ;exam not done, Extremities ;no clubbing cyanosis or edema skin warm dry, Neuropsych; mood and affect appropriate       Assessment & Plan:   # 19 55 year old African-American male referred for colon cancer screening. Patient is asymptomatic and of average risk with no family history of colon cancer. #2 chronic antiplatelet therapy with aspirin and Plavix #3 history of CVA-2 years ago #4 cardiomyopathy with EF of 30-35% has declined ICD #5 coronary artery disease status post stents 2007 and CABG 2014 #6 prior history of C. Difficile  Plan; Discussed options for colon cancer screening including colonoscopy and Cologurad  stool DNA testing. Patient is  at increased risk for complications with colonoscopy given chronic antiplatelet therapy and cardiomyopathy. We will proceed with Cologuard stool DNA testing which he is very agreeable with. If Cologuard is negative he should have repeat colon cancer screening in 3  years, if test is positive then he will need colonoscopy which would need to be scheduled at the hospital., And would also have to hold Plavix for 5-7 days prior to procedure. Patient will be established with Dr. Havery Moros. Artavious Trebilcock Genia Harold PA-C 04/04/2016   Cc: Tresa Garter, MD

## 2016-04-04 NOTE — Progress Notes (Signed)
Agree with assessment and plan as outlined.  

## 2016-04-17 DIAGNOSIS — Z1211 Encounter for screening for malignant neoplasm of colon: Secondary | ICD-10-CM | POA: Diagnosis not present

## 2016-04-17 DIAGNOSIS — Z1212 Encounter for screening for malignant neoplasm of rectum: Secondary | ICD-10-CM | POA: Diagnosis not present

## 2016-04-24 ENCOUNTER — Other Ambulatory Visit: Payer: Self-pay

## 2016-04-24 LAB — COLOGUARD: Cologuard: POSITIVE

## 2016-04-26 ENCOUNTER — Telehealth: Payer: Self-pay | Admitting: Physician Assistant

## 2016-04-26 ENCOUNTER — Other Ambulatory Visit: Payer: Self-pay

## 2016-04-26 DIAGNOSIS — R195 Other fecal abnormalities: Secondary | ICD-10-CM

## 2016-04-26 NOTE — Telephone Encounter (Signed)
See result note.  

## 2016-04-27 ENCOUNTER — Telehealth: Payer: Self-pay | Admitting: Cardiology

## 2016-04-27 ENCOUNTER — Telehealth: Payer: Self-pay

## 2016-04-27 NOTE — Telephone Encounter (Signed)
He may hold Plavix for 5 days for colonoscopy.  Maudene Stotler [place] MD, [facility]

## 2016-04-27 NOTE — Progress Notes (Signed)
Letter mailed

## 2016-04-27 NOTE — Telephone Encounter (Signed)
Pt has been cleared to be off of his Plavix 5 day prior to his procedure per Peter Martinique , Md. Note scanned in chart.

## 2016-04-27 NOTE — Telephone Encounter (Signed)
Clearance faxed via Epic to Dr. Havery Moros, Attention: Almyra Free @ [phone].

## 2016-04-27 NOTE — Telephone Encounter (Signed)
New Message  Request for surgical clearance: spoke with Almyra Free   1. What type of surgery is being performed? Colonoscopy  2. When is this surgery scheduled? no  3. Are there any medications that need to be held prior to surgery and how long? Plavix 5 days prior  4. Name of physician performing surgery? Thornell Mule  5. What is your office phone and fax number? [phone] fax [phone]

## 2016-04-27 NOTE — Telephone Encounter (Signed)
FORWARD TO DR Martinique, CHERYL

## 2016-05-04 ENCOUNTER — Telehealth: Payer: Self-pay | Admitting: Gastroenterology

## 2016-05-04 NOTE — Telephone Encounter (Signed)
Spoke to patient, gave him appointment dates/times. He was concerned about what it meant to have a positive cologuard test, explained that it is only one type of test and that for a more definitive answer the next step is a colonoscopy. Patient reassured.

## 2016-05-08 ENCOUNTER — Emergency Department (HOSPITAL_COMMUNITY)
Admission: EM | Admit: 2016-05-08 | Discharge: 2016-05-08 | Disposition: A | Discharge status: Home / Self Care | Payer: Managed Care, Other (non HMO) | Attending: Emergency Medicine | Admitting: Emergency Medicine

## 2016-05-08 ENCOUNTER — Emergency Department (HOSPITAL_COMMUNITY): Payer: Managed Care, Other (non HMO)

## 2016-05-08 ENCOUNTER — Encounter (HOSPITAL_COMMUNITY): Payer: Self-pay | Admitting: Emergency Medicine

## 2016-05-08 DIAGNOSIS — Z5321 Procedure and treatment not carried out due to patient leaving prior to being seen by health care provider: Secondary | ICD-10-CM | POA: Insufficient documentation

## 2016-05-08 DIAGNOSIS — Z951 Presence of aortocoronary bypass graft: Secondary | ICD-10-CM | POA: Insufficient documentation

## 2016-05-08 DIAGNOSIS — F17210 Nicotine dependence, cigarettes, uncomplicated: Secondary | ICD-10-CM | POA: Insufficient documentation

## 2016-05-08 DIAGNOSIS — I2510 Atherosclerotic heart disease of native coronary artery without angina pectoris: Secondary | ICD-10-CM | POA: Diagnosis not present

## 2016-05-08 DIAGNOSIS — Z8673 Personal history of transient ischemic attack (TIA), and cerebral infarction without residual deficits: Secondary | ICD-10-CM | POA: Diagnosis not present

## 2016-05-08 DIAGNOSIS — R079 Chest pain, unspecified: Secondary | ICD-10-CM | POA: Diagnosis not present

## 2016-05-08 DIAGNOSIS — Z955 Presence of coronary angioplasty implant and graft: Secondary | ICD-10-CM | POA: Diagnosis not present

## 2016-05-08 DIAGNOSIS — Z7982 Long term (current) use of aspirin: Secondary | ICD-10-CM | POA: Diagnosis not present

## 2016-05-08 LAB — BASIC METABOLIC PANEL
ANION GAP: 11 (ref 5–15)
BUN: 10 mg/dL (ref 6–20)
CHLORIDE: 101 mmol/L (ref 101–111)
CO2: 22 mmol/L (ref 22–32)
Calcium: 8.9 mg/dL (ref 8.9–10.3)
Creatinine, Ser: 0.88 mg/dL (ref 0.61–1.24)
GFR calc non Af Amer: >60 mL/min (ref >60)
Glucose, Bld: 92 mg/dL (ref 65–99)
POTASSIUM: 3.5 mmol/L (ref 3.5–5.1)
Sodium: 134 mmol/L — ABNORMAL LOW (ref 135–145)

## 2016-05-08 LAB — CBC
HEMATOCRIT: 44.6 % (ref 39.0–52.0)
HEMOGLOBIN: 15.4 g/dL (ref 13.0–17.0)
MCH: 29.3 pg (ref 26.0–34.0)
MCHC: 34.5 g/dL (ref 30.0–36.0)
MCV: 84.8 fL (ref 78.0–100.0)
Platelets: 159 10*3/uL (ref 150–400)
RBC: 5.26 MIL/uL (ref 4.22–5.81)
RDW: 13.8 % (ref 11.5–15.5)
WBC: 5.1 10*3/uL (ref 4.0–10.5)

## 2016-05-08 NOTE — ED Notes (Signed)
Pt resting comfortably. Denies pain and slight nausea. Aware of wait and to report changes

## 2016-05-08 NOTE — ED Notes (Signed)
Pt came to desk and stated that he had fell asleep. Pt also stated that he was just going to go home and may come back tomorrow if his symptoms come back Pt has walked out at this time.

## 2016-05-08 NOTE — ED Notes (Signed)
Patient called to room with no answer x 2

## 2016-05-08 NOTE — ED Notes (Signed)
Patient called a third time, still no answer

## 2016-05-08 NOTE — ED Triage Notes (Signed)
Pt presents to ED for assessment of intermittent chest pain with episodes of nausea, shortness of breath and dizziness today.  Pt had a hx of stroke, MI and open heart surgery, and just got off of work and was told by his cardiologist to come in.  Pt denies any pain or symptoms at this time.

## 2016-05-31 DIAGNOSIS — H47013 Ischemic optic neuropathy, bilateral: Secondary | ICD-10-CM | POA: Diagnosis not present

## 2016-05-31 DIAGNOSIS — H401233 Low-tension glaucoma, bilateral, severe stage: Secondary | ICD-10-CM | POA: Diagnosis not present

## 2016-05-31 DIAGNOSIS — H04123 Dry eye syndrome of bilateral lacrimal glands: Secondary | ICD-10-CM | POA: Diagnosis not present

## 2016-06-12 ENCOUNTER — Ambulatory Visit (AMBULATORY_SURGERY_CENTER): Payer: Self-pay

## 2016-06-12 VITALS — Ht 73.0 in | Wt 262.8 lb

## 2016-06-12 DIAGNOSIS — R195 Other fecal abnormalities: Secondary | ICD-10-CM

## 2016-06-12 MED ORDER — NA SULFATE-K SULFATE-MG SULF 17.5-3.13-1.6 GM/177ML PO SOLN: ORAL | 0 refills | Status: DC

## 2016-06-12 NOTE — Progress Notes (Signed)
Per pt, no allergies to soy or egg products.Pt not taking any weight loss meds or using  O2 at home. 

## 2016-06-29 ENCOUNTER — Encounter (HOSPITAL_COMMUNITY): Payer: Self-pay | Admitting: *Deleted

## 2016-07-02 ENCOUNTER — Encounter (HOSPITAL_COMMUNITY): Payer: Self-pay | Admitting: *Deleted

## 2016-07-03 ENCOUNTER — Encounter (HOSPITAL_COMMUNITY)
Admission: RE | Disposition: A | Discharge status: Home / Self Care | Payer: Self-pay | Source: Ambulatory Visit | Attending: Gastroenterology

## 2016-07-03 ENCOUNTER — Ambulatory Visit (HOSPITAL_COMMUNITY): Payer: Managed Care, Other (non HMO) | Admitting: Anesthesiology

## 2016-07-03 ENCOUNTER — Encounter (HOSPITAL_COMMUNITY): Payer: Self-pay

## 2016-07-03 ENCOUNTER — Ambulatory Visit (HOSPITAL_COMMUNITY)
Admission: RE | Admit: 2016-07-03 | Discharge: 2016-07-03 | Disposition: A | Discharge status: Home / Self Care | Payer: Managed Care, Other (non HMO) | Source: Ambulatory Visit | Attending: Gastroenterology | Admitting: Gastroenterology

## 2016-07-03 DIAGNOSIS — K648 Other hemorrhoids: Secondary | ICD-10-CM | POA: Diagnosis not present

## 2016-07-03 DIAGNOSIS — Z951 Presence of aortocoronary bypass graft: Secondary | ICD-10-CM | POA: Diagnosis not present

## 2016-07-03 DIAGNOSIS — K635 Polyp of colon: Secondary | ICD-10-CM | POA: Insufficient documentation

## 2016-07-03 DIAGNOSIS — Z1211 Encounter for screening for malignant neoplasm of colon: Secondary | ICD-10-CM | POA: Diagnosis present

## 2016-07-03 DIAGNOSIS — I255 Ischemic cardiomyopathy: Secondary | ICD-10-CM | POA: Insufficient documentation

## 2016-07-03 DIAGNOSIS — D124 Benign neoplasm of descending colon: Secondary | ICD-10-CM | POA: Diagnosis not present

## 2016-07-03 DIAGNOSIS — A0472 Enterocolitis due to Clostridium difficile, not specified as recurrent: Secondary | ICD-10-CM | POA: Insufficient documentation

## 2016-07-03 DIAGNOSIS — K644 Residual hemorrhoidal skin tags: Secondary | ICD-10-CM | POA: Insufficient documentation

## 2016-07-03 DIAGNOSIS — K5730 Diverticulosis of large intestine without perforation or abscess without bleeding: Secondary | ICD-10-CM | POA: Diagnosis not present

## 2016-07-03 DIAGNOSIS — I253 Aneurysm of heart: Secondary | ICD-10-CM | POA: Diagnosis not present

## 2016-07-03 DIAGNOSIS — K589 Irritable bowel syndrome without diarrhea: Secondary | ICD-10-CM | POA: Insufficient documentation

## 2016-07-03 DIAGNOSIS — I509 Heart failure, unspecified: Secondary | ICD-10-CM | POA: Diagnosis not present

## 2016-07-03 DIAGNOSIS — I2510 Atherosclerotic heart disease of native coronary artery without angina pectoris: Secondary | ICD-10-CM | POA: Diagnosis not present

## 2016-07-03 DIAGNOSIS — I110 Hypertensive heart disease with heart failure: Secondary | ICD-10-CM | POA: Diagnosis not present

## 2016-07-03 DIAGNOSIS — D125 Benign neoplasm of sigmoid colon: Secondary | ICD-10-CM

## 2016-07-03 DIAGNOSIS — E785 Hyperlipidemia, unspecified: Secondary | ICD-10-CM | POA: Insufficient documentation

## 2016-07-03 DIAGNOSIS — Z7902 Long term (current) use of antithrombotics/antiplatelets: Secondary | ICD-10-CM | POA: Insufficient documentation

## 2016-07-03 DIAGNOSIS — R195 Other fecal abnormalities: Secondary | ICD-10-CM | POA: Diagnosis not present

## 2016-07-03 DIAGNOSIS — F17210 Nicotine dependence, cigarettes, uncomplicated: Secondary | ICD-10-CM | POA: Insufficient documentation

## 2016-07-03 DIAGNOSIS — Z7982 Long term (current) use of aspirin: Secondary | ICD-10-CM | POA: Insufficient documentation

## 2016-07-03 DIAGNOSIS — Z8673 Personal history of transient ischemic attack (TIA), and cerebral infarction without residual deficits: Secondary | ICD-10-CM | POA: Insufficient documentation

## 2016-07-03 DIAGNOSIS — I252 Old myocardial infarction: Secondary | ICD-10-CM | POA: Insufficient documentation

## 2016-07-03 DIAGNOSIS — E876 Hypokalemia: Secondary | ICD-10-CM | POA: Diagnosis not present

## 2016-07-03 DIAGNOSIS — Z955 Presence of coronary angioplasty implant and graft: Secondary | ICD-10-CM | POA: Diagnosis not present

## 2016-07-03 HISTORY — DX: Abnormal electrocardiogram (ECG) (EKG): R94.31

## 2016-07-03 HISTORY — PX: COLONOSCOPY WITH PROPOFOL: SHX5780

## 2016-07-03 SURGERY — COLONOSCOPY WITH PROPOFOL
Anesthesia: Monitor Anesthesia Care

## 2016-07-03 MED ORDER — LACTATED RINGERS IV SOLN
INTRAVENOUS | Status: DC
  Administered 2016-07-03: 1000 mL via INTRAVENOUS

## 2016-07-03 MED ORDER — PROPOFOL 500 MG/50ML IV EMUL
INTRAVENOUS | Status: DC | PRN
  Administered 2016-07-03: 150 ug/kg/min via INTRAVENOUS

## 2016-07-03 MED ORDER — SODIUM CHLORIDE 0.9 % IV SOLN: INTRAVENOUS | Status: DC

## 2016-07-03 MED ORDER — PROPOFOL 10 MG/ML IV BOLUS
INTRAVENOUS | Status: AC
  Filled 2016-07-03: qty 40

## 2016-07-03 MED ORDER — PHENYLEPHRINE HCL 10 MG/ML IJ SOLN
INTRAMUSCULAR | Status: DC | PRN
  Administered 2016-07-03: 40 ug via INTRAVENOUS

## 2016-07-03 MED ORDER — PROPOFOL 500 MG/50ML IV EMUL
INTRAVENOUS | Status: DC | PRN
  Administered 2016-07-03: 50 mg via INTRAVENOUS

## 2016-07-03 MED ORDER — PROPOFOL 10 MG/ML IV BOLUS
INTRAVENOUS | Status: AC
  Filled 2016-07-03: qty 20

## 2016-07-03 SURGICAL SUPPLY — 22 items

## 2016-07-03 NOTE — Op Note (Signed)
[facility] Patient Name: [name] Procedure Date: 07/03/2016 MRN: [id number] Attending MD: Carlota Raspberry. Armbruster MD, MD Date of Birth: 10-Nov-1960 CSN: [id number] Age: 56 Admit Type: Outpatient Procedure:                Colonoscopy Indications:              This is the patient's first colonoscopy, Positive                            Cologuard test Providers:                Carlota Raspberry. Armbruster MD, MD, Carolynn Comment, RN,                            Cherylynn Ridges, Technician, Rosario Adie, CRNA Referring MD:              Medicines:                Monitored Anesthesia Care Complications:            No immediate complications. Estimated blood loss:                            Minimal. Estimated Blood Loss:     Estimated blood loss was minimal. Procedure:                Pre-Anesthesia Assessment:                           - Prior to the procedure, a History and Physical                            was performed, and patient medications and                            allergies were reviewed. The patient's tolerance of                            previous anesthesia was also reviewed. The risks                            and benefits of the procedure and the sedation                            options and risks were discussed with the patient.                            All questions were answered, and informed consent                            was obtained. Prior Anticoagulants: The patient has                            taken Plavix (clopidogrel), last dose was 5 days  prior to procedure. ASA Grade Assessment: III - A                            patient with severe systemic disease. After                            reviewing the risks and benefits, the patient was                            deemed in satisfactory condition to undergo the                            procedure.                           After obtaining informed consent, the  colonoscope                            was passed under direct vision. Throughout the                            procedure, the patient's blood pressure, pulse, and                            oxygen saturations were monitored continuously. The                            EC-[id number] ([id number]) scope was introduced through                            the anus and advanced to the the cecum, identified                            by appendiceal orifice and ileocecal valve. The                            colonoscopy was performed without difficulty. The                            patient tolerated the procedure well. The quality                            of the bowel preparation was adequate. The                            ileocecal valve, appendiceal orifice, and rectum                            were photographed. Scope In: 9:27:22 AM Scope Out: 9:57:17 AM Scope Withdrawal Time: 0 hours 26 minutes 21 seconds  Total Procedure Duration: 0 hours 29 minutes 55 seconds  Findings:      The perianal exam findings include non-thrombosed external hemorrhoids.      Multiple medium-mouthed diverticula were found in the sigmoid colon,  descending colon and cecum. Burden of diverticulum highest in the       sigmoid colon with associated erythema in thickened folds.      A 6 mm polyp was found in the descending colon. The polyp was sessile.       The polyp was removed with a cold snare. Resection and retrieval were       complete.      A 8 mm polyp was found in the sigmoid colon. The polyp was flat. The       polyp was removed with a cold snare. Resection and retrieval were       complete.      A 5 mm polyp was found in the sigmoid colon, was erythematous, suspect       inflammatory polyp. The polyp was semi-pedunculated. The polyp was       removed with a hot snare. Resection and retrieval were complete.      Internal hemorrhoids were found during retroflexion.      The exam was otherwise without  abnormality. There was significant spasm       in the colon which prolonged the procedure. Impression:               - Non-thrombosed external hemorrhoids found on                            perianal exam.                           - Diverticulosis in the sigmoid colon, in the                            descending colon and in the cecum.                           - One 6 mm polyp in the descending colon, removed                            with a cold snare. Resected and retrieved.                           - One 8 mm polyp in the sigmoid colon, removed with                            a cold snare. Resected and retrieved.                           - One 5 mm polyp in the sigmoid colon, removed with                            a hot snare. Resected and retrieved.                           - Internal hemorrhoids.                           - Spastic colon                           -  The examination was otherwise normal. Moderate Sedation:      No moderate sedation, case performed with MAC Recommendation:           - Patient has a contact number available for                            emergencies. The signs and symptoms of potential                            delayed complications were discussed with the                            patient. Return to normal activities tomorrow.                            Written discharge instructions were provided to the                            patient.                           - Resume previous diet.                           - Continue present medications including aspirin                           - Resume Plavix in 3 days                           - No ibuprofen, naproxen, or other non-steroidal                            anti-inflammatory drugs for 2 weeks after polyp                            removal.                           - Await pathology results.                           - Repeat colonoscopy is recommended for                             surveillance. The colonoscopy date will be                            determined after pathology results from today's                            exam become available for review. Procedure Code(s):        --- Professional ---                           [phone], Colonoscopy, flexible; with removal of  tumor(s), polyp(s), or other lesion(s) by snare                            technique Diagnosis Code(s):        --- Professional ---                           K64.4, Residual hemorrhoidal skin tags                           K64.8, Other hemorrhoids                           D12.4, Benign neoplasm of descending colon                           D12.5, Benign neoplasm of sigmoid colon                           R19.5, Other fecal abnormalities                           K57.30, Diverticulosis of large intestine without                            perforation or abscess without bleeding CPT copyright 2016 American [employer] Association. All rights reserved. The codes documented in this report are preliminary and upon coder review may  be revised to meet current compliance requirements. Remo Lipps P. Armbruster MD, MD 07/03/2016 10:05:26 AM This report has been signed electronically. Number of Addenda: 0

## 2016-07-03 NOTE — Transfer of Care (Signed)
Immediate Anesthesia Transfer of Care Note  Patient: [name]  Procedure(s) Performed: Procedure(s): COLONOSCOPY WITH PROPOFOL (N/A)  Patient Location: PACU  Anesthesia Type:MAC  Level of Consciousness: awake, alert  and oriented  Airway & Oxygen Therapy: Patient Spontanous Breathing and Patient connected to face mask oxygen  Post-op Assessment: Report given to RN and Post -op Vital signs reviewed and stable  Post vital signs: Reviewed and stable  Last Vitals:  Vitals:   07/03/16 0820  BP: (!) 150/102  Pulse: 76  Resp: 20  Temp: 37 C    Last Pain:  Vitals:   07/03/16 0820  TempSrc: Oral         Complications: No apparent anesthesia complications

## 2016-07-03 NOTE — Anesthesia Preprocedure Evaluation (Addendum)
Anesthesia Evaluation  Patient identified by MRN, date of birth, ID band Patient awake    Reviewed: Allergy & Precautions, NPO status , Patient's Chart, lab work & pertinent test results  Airway Mallampati: II  TM Distance: >3 FB Neck ROM: Full    Dental  (+) Upper Dentures   Pulmonary Current Smoker,    breath sounds clear to auscultation       Cardiovascular hypertension, + CAD, + Past MI and +CHF   Rhythm:Regular Rate:Normal  - Left ventricle: Inferior and septal akinesis The cavity size was   moderately dilated. Wall thickness was increased in a pattern of   mild LVH. Systolic function was moderately to severely reduced.   The estimated ejection fraction was in the range of 30% to 35%. - Left atrium: The atrium was moderately dilated. - Atrial septum: No defect or patent foramen ovale was identified.    Neuro/Psych    GI/Hepatic Neg liver ROS,   Endo/Other    Renal/GU negative Renal ROS     Musculoskeletal   Abdominal   Peds  Hematology   Anesthesia Other Findings   Reproductive/Obstetrics                           Anesthesia Physical Anesthesia Plan  ASA: III  Anesthesia Plan: MAC   Post-op Pain Management:    Induction: Intravenous  Airway Management Planned: Natural Airway and Simple Face Mask  Additional Equipment:   Intra-op Plan:   Post-operative Plan:   Informed Consent: I have reviewed the patients History and Physical, chart, labs and discussed the procedure including the risks, benefits and alternatives for the proposed anesthesia with the patient or authorized representative who has indicated his/her understanding and acceptance.     Plan Discussed with: CRNA  Anesthesia Plan Comments:        Anesthesia Quick Evaluation

## 2016-07-03 NOTE — Discharge Instructions (Signed)

## 2016-07-03 NOTE — H&P (Signed)
HPI :  56 y/o male with a positive Cologuard, done for initial CRC screening. Uncle with colon cancer. He has a cardiac history as outlined below, on aspirin and plavix. Has held plavix for 5 days. Here for colonoscopy. Otherwise without complaints today.   Past Medical History:  Diagnosis Date  . Abnormal EKG    hx of ischemia showing up on ekg's  . Acute myopericarditis    a. 03/2013: readm with hypoxia, tachycardia, elevated ESR/CRP, elevated troponin with 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and myopericarditis; treated with steroids.  . Acute respiratory failure ([place])    a. 01/2013: VDRF. b. 03/2013: hypoxia requiring supp O2 during adm, resolved by discharge.  . C. difficile colitis    a. 01/2013 during prolonged adm. b. Recurred 03/2013.  . Cardiac tamponade    a. 01/2013 s/p drain.  . Coronary artery disease    a. s/p MI in 2009 in [place] with stenting of the LCX and LAD;  b. 12/2012 NSTEMI/CAD: LM nl, LAD patent prox stent, LCX 50-70 isr (FFR 0.84), RCA dom, 132m EF 40-45%-->Med Rx. c. 01/2013: anterolateral STEMI complicated by pericardial effusion (presumed purulent pericarditis) and tamponade s/p drain, ruptured LV pseudoaneurysm s/p CorMatrix patch, CABGx1 (SVG-OM1), fever, CVA, VDRF, C diff.  . CVA (cerebral infarction)    a. 01/2013 in setting of prolonged hospitalization - residual L arm weakness.  . 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([place]    a. 03/2013: readm with hypoxia, tachycardia, elevated ESR/CRP, elevated troponin with 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and myopericarditis; treated with steroids.  . Hyperlipidemia   . Hypokalemia   . Hyponatremia    a. During late 2014.  . Ischemic cardiomyopathy    a. Sept/Oct 2014: EF ~40% (ICM). b. 03/2013: EF 25-30%. Off ACEI due to hypotension (MIXED NICM/ICM).  .Marland KitchenMyocardial infarction 2009  . Pericardial effusion    a. 01/2013: pericardial effusion (presumed purulent pericarditis) and cardiac tamponade s/p drain. b. Persistent moderate pericardial effusion 03/2013.  .  Pseudoaneurysm of left ventricle of heart    a. 01/2013:  Ruptured inferoposterior LV pseudoaneurysm s/p CorMatrix patch.  . Sinus bradycardia    asymptomatic  . Stroke ([facility] 2009   weak lt side-lt arm  . Tobacco abuse    a. quit 12/2012.  . Wears dentures    top     Past Surgical History:  Procedure Laterality Date  . CARDIAC CATHETERIZATION  01/06/2013  . CORONARY ANGIOPLASTY WITH STENT PLACEMENT  2000's X 2   "1 + 1" (01/06/2013)  . CORONARY ARTERY BYPASS GRAFT N/A 01/28/2013   Procedure: CORONARY ARTERY BYPASS GRAFTING (CABG);  Surgeon: SMelrose Nakayama MD;  [place]  Service: Open Heart Surgery;  Laterality: N/A;  CABG x one, using left greater saphenous vein harvested endoscopically  . INTRAOPERATIVE TRANSESOPHAGEAL ECHOCARDIOGRAM N/A 01/28/2013   Procedure: INTRAOPERATIVE TRANSESOPHAGEAL ECHOCARDIOGRAM;  Surgeon: SMelrose Nakayama MD;  [place]  Service: Open Heart Surgery;  Laterality: N/A;  . LEFT HEART CATHETERIZATION WITH CORONARY ANGIOGRAM N/A 01/06/2013   Procedure: LEFT HEART CATHETERIZATION WITH CORONARY ANGIOGRAM;  Surgeon: Peter M JMartinique MD;  [facility];  Service: Cardiovascular;  Laterality: N/A;  . LEFT HEART CATHETERIZATION WITH CORONARY ANGIOGRAM N/A 01/21/2013   Procedure: LEFT HEART CATHETERIZATION WITH CORONARY ANGIOGRAM;  Surgeon: CBurnell Blanks MD;  [facility];  Service: Cardiovascular;  Laterality: N/A;  . MASS EXCISION Right 07/21/2014   Procedure: EXCISION RIGHT NECK MASS;  Surgeon: TErroll Luna MD;  [place]  Service: General;  Laterality: Right;  . PERICARDIAL TAP N/A 01/24/2013   Procedure: PERICARDIAL TAP;  Surgeon: Blane Ohara, MD;  [facility];  Service: Cardiovascular;  Laterality: N/A;  . RIGHT HEART CATHETERIZATION Right 01/24/2013   Procedure: RIGHT HEART CATH;  Surgeon: Blane Ohara, MD;  Location: [facility] CATH LAB;  Service: Cardiovascular;   Laterality: Right;  . VENTRICULAR ANEURYSM RESECTION N/A 01/28/2013   Procedure: LEFT VENTRICULAR ANEURYSM REPAIR;  Surgeon: Melrose Nakayama, MD;  Location: [place];  Service: Open Heart Surgery;  Laterality: N/A;   Family History  Problem Relation Age of Onset  . Heart attack Mother   . Diabetes Mother   . Hypertension Mother   . Hypertension Father   . CAD    . HIV Brother   . Stroke Neg Hx    Social History  Substance Use Topics  . Smoking status: Current Every Day Smoker    Years: 35.00    Types: Cigarettes  . Smokeless tobacco: Never Used     Comment: smokes 10-12 cigarettes per week  . Alcohol use No   Current Facility-Administered Medications  Medication Dose Route Frequency Provider Last Rate Last Dose  . 0.9 %  sodium chloride infusion   Intravenous Continuous Manus Gunning, MD      . lactated ringers infusion   Intravenous Continuous Manus Gunning, MD 50 mL/hr at 07/03/16 0833 1,000 mL at 07/03/16 9150   No Known Allergies   Review of Systems: All systems reviewed and negative except where noted in HPI.   Lab Results  Component Value Date   WBC 5.1 05/08/2016   HGB 15.4 05/08/2016   HCT 44.6 05/08/2016   MCV 84.8 05/08/2016   PLT 159 05/08/2016     Physical Exam: BP (!) 150/102   Pulse 76   Temp 98.6 F (37 C) (Oral)   Resp 20   SpO2 98%  Constitutional: Pleasant,well-developed, male in no acute distress. HEENT: Normocephalic and atraumatic. Conjunctivae are normal. No scleral icterus. Neck supple.  Cardiovascular: Normal rate, regular rhythm.  Pulmonary/chest: Effort normal and breath sounds normal.  Abdominal: Soft, nondistended, nontender.   Extremities: no edema  ASSESSMENT AND PLAN: 56 y/o male with comorbidities as outlined above, with (+) Cologuard stool test, here for colonoscopy to further evaluate. I have discussed risks / benefits of anesthesia and colonoscopy with him, and he wished to proceed. Last dose of plavix  5 days ago. All questions answered he wished to proceed.   Lacon Cellar, MD [facility] Pager [phone]

## 2016-07-03 NOTE — Interval H&P Note (Signed)
History and Physical Interval Note:  07/03/2016 9:19 AM  [name]  has presented today for surgery, with the diagnosis of positive cologuard test, screening for colon cancer  The various methods of treatment have been discussed with the patient and family. After consideration of risks, benefits and other options for treatment, the patient has consented to  Procedure(s): COLONOSCOPY WITH PROPOFOL (N/A) as a surgical intervention .  The patient's history has been reviewed, patient examined, no change in status, stable for surgery.  I have reviewed the patient's chart and labs.  Questions were answered to the patient's satisfaction.     Renelda Loma [name]

## 2016-07-04 ENCOUNTER — Encounter (HOSPITAL_COMMUNITY): Payer: Self-pay | Admitting: Gastroenterology

## 2016-07-05 ENCOUNTER — Encounter: Payer: Self-pay | Admitting: Gastroenterology

## 2016-07-06 NOTE — Anesthesia Postprocedure Evaluation (Addendum)
Anesthesia Post Note  Patient: [name]  Procedure(s) Performed: Procedure(s) (LRB): COLONOSCOPY WITH PROPOFOL (N/A)  Patient location during evaluation: Endoscopy Anesthesia Type: MAC Level of consciousness: awake and alert Pain management: pain level controlled Vital Signs Assessment: post-procedure vital signs reviewed and stable Respiratory status: spontaneous breathing, nonlabored ventilation, respiratory function stable and patient connected to nasal cannula oxygen Cardiovascular status: stable and blood pressure returned to baseline Anesthetic complications: no       Last Vitals:  Vitals:   07/03/16 1020 07/03/16 1030  BP: 132/85 129/85  Pulse: (!) 58 (!) 58  Resp: 20 15  Temp:      Last Pain:  Vitals:   07/03/16 1009  TempSrc: Oral                 [name],JAMES TERRILL

## 2016-07-10 DIAGNOSIS — H401233 Low-tension glaucoma, bilateral, severe stage: Secondary | ICD-10-CM | POA: Diagnosis not present

## 2016-07-10 DIAGNOSIS — H47013 Ischemic optic neuropathy, bilateral: Secondary | ICD-10-CM | POA: Diagnosis not present

## 2016-09-14 NOTE — Addendum Note (Signed)
Addendum  created 09/14/16 1236 by Rica Koyanagi, MD   Sign clinical note

## 2016-11-14 ENCOUNTER — Ambulatory Visit: Payer: Managed Care, Other (non HMO) | Attending: Internal Medicine | Admitting: Internal Medicine

## 2016-11-14 ENCOUNTER — Encounter: Payer: Self-pay | Admitting: Internal Medicine

## 2016-11-14 VITALS — BP 150/79 | HR 59 | Temp 98.2°F | Resp 16 | Wt 250.4 lb

## 2016-11-14 DIAGNOSIS — F17210 Nicotine dependence, cigarettes, uncomplicated: Secondary | ICD-10-CM | POA: Insufficient documentation

## 2016-11-14 DIAGNOSIS — Z8673 Personal history of transient ischemic attack (TIA), and cerebral infarction without residual deficits: Secondary | ICD-10-CM | POA: Insufficient documentation

## 2016-11-14 DIAGNOSIS — I255 Ischemic cardiomyopathy: Secondary | ICD-10-CM | POA: Insufficient documentation

## 2016-11-14 DIAGNOSIS — I10 Essential (primary) hypertension: Secondary | ICD-10-CM | POA: Insufficient documentation

## 2016-11-14 DIAGNOSIS — Z7982 Long term (current) use of aspirin: Secondary | ICD-10-CM | POA: Diagnosis not present

## 2016-11-14 DIAGNOSIS — Z0000 Encounter for general adult medical examination without abnormal findings: Secondary | ICD-10-CM

## 2016-11-14 DIAGNOSIS — Z23 Encounter for immunization: Secondary | ICD-10-CM

## 2016-11-14 DIAGNOSIS — Z951 Presence of aortocoronary bypass graft: Secondary | ICD-10-CM | POA: Diagnosis not present

## 2016-11-14 DIAGNOSIS — I253 Aneurysm of heart: Secondary | ICD-10-CM | POA: Insufficient documentation

## 2016-11-14 DIAGNOSIS — Z9889 Other specified postprocedural states: Secondary | ICD-10-CM | POA: Insufficient documentation

## 2016-11-14 DIAGNOSIS — E785 Hyperlipidemia, unspecified: Secondary | ICD-10-CM | POA: Insufficient documentation

## 2016-11-14 DIAGNOSIS — I2510 Atherosclerotic heart disease of native coronary artery without angina pectoris: Secondary | ICD-10-CM | POA: Insufficient documentation

## 2016-11-14 DIAGNOSIS — I252 Old myocardial infarction: Secondary | ICD-10-CM | POA: Insufficient documentation

## 2016-11-14 DIAGNOSIS — Z79899 Other long term (current) drug therapy: Secondary | ICD-10-CM | POA: Diagnosis not present

## 2016-11-14 DIAGNOSIS — F17200 Nicotine dependence, unspecified, uncomplicated: Secondary | ICD-10-CM | POA: Diagnosis not present

## 2016-11-14 MED ORDER — CARVEDILOL 25 MG PO TABS: 25.0000 mg | ORAL_TABLET | Freq: Two times a day (BID) | ORAL | 3 refills | Status: DC

## 2016-11-14 MED ORDER — SIMVASTATIN 40 MG PO TABS: 40.0000 mg | ORAL_TABLET | Freq: Every day | ORAL | 3 refills | Status: DC

## 2016-11-14 MED ORDER — CLOPIDOGREL BISULFATE 75 MG PO TABS: 75.0000 mg | ORAL_TABLET | Freq: Every day | ORAL | 2 refills | Status: DC

## 2016-11-14 MED ORDER — LISINOPRIL 10 MG PO TABS: 10.0000 mg | ORAL_TABLET | Freq: Two times a day (BID) | ORAL | 3 refills | Status: DC

## 2016-11-14 NOTE — Patient Instructions (Signed)
DASH Eating Plan DASH stands for "Dietary Approaches to Stop Hypertension." The DASH eating plan is a healthy eating plan that has been shown to reduce high blood pressure (hypertension). It may also reduce your risk for type 2 diabetes, heart disease, and stroke. The DASH eating plan may also help with weight loss. What are tips for following this plan? General guidelines  Avoid eating more than 2,300 mg (milligrams) of salt (sodium) a day. If you have hypertension, you may need to reduce your sodium intake to 1,500 mg a day.  Limit alcohol intake to no more than 1 drink a day for nonpregnant women and 2 drinks a day for men. One drink equals 12 oz of beer, 5 oz of wine, or 1 oz of hard liquor.  Work with your health care provider to maintain a healthy body weight or to lose weight. Ask what an ideal weight is for you.  Get at least 30 minutes of exercise that causes your heart to beat faster (aerobic exercise) most days of the week. Activities may include walking, swimming, or biking.  Work with your health care provider or diet and nutrition specialist (dietitian) to adjust your eating plan to your individual calorie needs. Reading food labels  Check food labels for the amount of sodium per serving. Choose foods with less than 5 percent of the Daily Value of sodium. Generally, foods with less than 300 mg of sodium per serving fit into this eating plan.  To find whole grains, look for the word "whole" as the first word in the ingredient list. Shopping  Buy products labeled as "low-sodium" or "no salt added."  Buy fresh foods. Avoid canned foods and premade or frozen meals. Cooking  Avoid adding salt when cooking. Use salt-free seasonings or herbs instead of table salt or sea salt. Check with your health care provider or pharmacist before using salt substitutes.  Do not fry foods. Cook foods using healthy methods such as baking, boiling, grilling, and broiling instead.  Cook with  heart-healthy oils, such as olive, canola, soybean, or sunflower oil. Meal planning   Eat a balanced diet that includes: ? 5 or more servings of fruits and vegetables each day. At each meal, try to fill half of your plate with fruits and vegetables. ? Up to 6-8 servings of whole grains each day. ? Less than 6 oz of lean meat, poultry, or fish each day. A 3-oz serving of meat is about the same size as a deck of cards. One egg equals 1 oz. ? 2 servings of low-fat dairy each day. ? A serving of nuts, seeds, or beans 5 times each week. ? Heart-healthy fats. Healthy fats called Omega-3 fatty acids are found in foods such as flaxseeds and coldwater fish, like sardines, salmon, and mackerel.  Limit how much you eat of the following: ? Canned or prepackaged foods. ? Food that is high in trans fat, such as fried foods. ? Food that is high in saturated fat, such as fatty meat. ? Sweets, desserts, sugary drinks, and other foods with added sugar. ? Full-fat dairy products.  Do not salt foods before eating.  Try to eat at least 2 vegetarian meals each week.  Eat more home-cooked food and less restaurant, buffet, and fast food.  When eating at a restaurant, ask that your food be prepared with less salt or no salt, if possible. What foods are recommended? The items listed may not be a complete list. Talk with your dietitian about what   dietary choices are best for you. Grains Whole-grain or whole-wheat bread. Whole-grain or whole-wheat pasta. Brown rice. Oatmeal. Quinoa. Bulgur. Whole-grain and low-sodium cereals. Pita bread. Low-fat, low-sodium crackers. Whole-wheat flour tortillas. Vegetables Fresh or frozen vegetables (raw, steamed, roasted, or grilled). Low-sodium or reduced-sodium tomato and vegetable juice. Low-sodium or reduced-sodium tomato sauce and tomato paste. Low-sodium or reduced-sodium canned vegetables. Fruits All fresh, dried, or frozen fruit. Canned fruit in natural juice (without  added sugar). Meat and other protein foods Skinless chicken or turkey. Ground chicken or turkey. Pork with fat trimmed off. Fish and seafood. Egg whites. Dried beans, peas, or lentils. Unsalted nuts, nut butters, and seeds. Unsalted canned beans. Lean cuts of beef with fat trimmed off. Low-sodium, lean deli meat. Dairy Low-fat (1%) or fat-free (skim) milk. Fat-free, low-fat, or reduced-fat cheeses. Nonfat, low-sodium ricotta or cottage cheese. Low-fat or nonfat yogurt. Low-fat, low-sodium cheese. Fats and oils Soft margarine without trans fats. Vegetable oil. Low-fat, reduced-fat, or light mayonnaise and salad dressings (reduced-sodium). Canola, safflower, olive, soybean, and sunflower oils. Avocado. Seasoning and other foods Herbs. Spices. Seasoning mixes without salt. Unsalted popcorn and pretzels. Fat-free sweets. What foods are not recommended? The items listed may not be a complete list. Talk with your dietitian about what dietary choices are best for you. Grains Baked goods made with fat, such as croissants, muffins, or some breads. Dry pasta or rice meal packs. Vegetables Creamed or fried vegetables. Vegetables in a cheese sauce. Regular canned vegetables (not low-sodium or reduced-sodium). Regular canned tomato sauce and paste (not low-sodium or reduced-sodium). Regular tomato and vegetable juice (not low-sodium or reduced-sodium). Pickles. Olives. Fruits Canned fruit in a light or heavy syrup. Fried fruit. Fruit in cream or butter sauce. Meat and other protein foods Fatty cuts of meat. Ribs. Fried meat. Bacon. Sausage. Bologna and other processed lunch meats. Salami. Fatback. Hotdogs. Bratwurst. Salted nuts and seeds. Canned beans with added salt. Canned or smoked fish. Whole eggs or egg yolks. Chicken or turkey with skin. Dairy Whole or 2% milk, cream, and half-and-half. Whole or full-fat cream cheese. Whole-fat or sweetened yogurt. Full-fat cheese. Nondairy creamers. Whipped toppings.  Processed cheese and cheese spreads. Fats and oils Butter. Stick margarine. Lard. Shortening. Ghee. Bacon fat. Tropical oils, such as coconut, palm kernel, or palm oil. Seasoning and other foods Salted popcorn and pretzels. Onion salt, garlic salt, seasoned salt, table salt, and sea salt. Worcestershire sauce. Tartar sauce. Barbecue sauce. Teriyaki sauce. Soy sauce, including reduced-sodium. Steak sauce. Canned and packaged gravies. Fish sauce. Oyster sauce. Cocktail sauce. Horseradish that you find on the shelf. Ketchup. Mustard. Meat flavorings and tenderizers. Bouillon cubes. Hot sauce and Tabasco sauce. Premade or packaged marinades. Premade or packaged taco seasonings. Relishes. Regular salad dressings. Where to find more information:  National Heart, Lung, and Blood Institute: www.nhlbi.nih.gov  American Heart Association: www.heart.org Summary  The DASH eating plan is a healthy eating plan that has been shown to reduce high blood pressure (hypertension). It may also reduce your risk for type 2 diabetes, heart disease, and stroke.  With the DASH eating plan, you should limit salt (sodium) intake to 2,300 mg a day. If you have hypertension, you may need to reduce your sodium intake to 1,500 mg a day.  When on the DASH eating plan, aim to eat more fresh fruits and vegetables, whole grains, lean proteins, low-fat dairy, and heart-healthy fats.  Work with your health care provider or diet and nutrition specialist (dietitian) to adjust your eating plan to your individual   calorie needs. This information is not intended to replace advice given to you by your health care provider. Make sure you discuss any questions you have with your health care provider. Document Released: 03/22/2011 Document Revised: 03/26/2016 Document Reviewed: 03/26/2016 Elsevier Interactive Patient Education  2017 [employer]. Hypertension Hypertension, commonly called high blood pressure, is when the force of blood  pumping through the arteries is too strong. The arteries are the blood vessels that carry blood from the heart throughout the body. Hypertension forces the heart to work harder to pump blood and may cause arteries to become narrow or stiff. Having untreated or uncontrolled hypertension can cause heart attacks, strokes, kidney disease, and other problems. A blood pressure reading consists of a higher number over a lower number. Ideally, your blood pressure should be below 120/80. The first ("top") number is called the systolic pressure. It is a measure of the pressure in your arteries as your heart beats. The second ("bottom") number is called the diastolic pressure. It is a measure of the pressure in your arteries as the heart relaxes. What are the causes? The cause of this condition is not known. What increases the risk? Some risk factors for high blood pressure are under your control. Others are not. Factors you can change  Smoking.  Having type 2 diabetes mellitus, high cholesterol, or both.  Not getting enough exercise or physical activity.  Being overweight.  Having too much fat, sugar, calories, or salt (sodium) in your diet.  Drinking too much alcohol. Factors that are difficult or impossible to change  Having chronic kidney disease.  Having a family history of high blood pressure.  Age. Risk increases with age.  Race. You may be at higher risk if you are African-American.  Gender. Men are at higher risk than women before age 45. After age 65, women are at higher risk than men.  Having obstructive sleep apnea.  Stress. What are the signs or symptoms? Extremely high blood pressure (hypertensive crisis) may cause:  Headache.  Anxiety.  Shortness of breath.  Nosebleed.  Nausea and vomiting.  Severe chest pain.  Jerky movements you cannot control (seizures).  How is this diagnosed? This condition is diagnosed by measuring your blood pressure while you are  seated, with your arm resting on a surface. The cuff of the blood pressure monitor will be placed directly against the skin of your upper arm at the level of your heart. It should be measured at least twice using the same arm. Certain conditions can cause a difference in blood pressure between your right and left arms. Certain factors can cause blood pressure readings to be lower or higher than normal (elevated) for a short period of time:  When your blood pressure is higher when you are in a health care provider's office than when you are at home, this is called white coat hypertension. Most people with this condition do not need medicines.  When your blood pressure is higher at home than when you are in a health care provider's office, this is called masked hypertension. Most people with this condition may need medicines to control blood pressure.  If you have a high blood pressure reading during one visit or you have normal blood pressure with other risk factors:  You may be asked to return on a different day to have your blood pressure checked again.  You may be asked to monitor your blood pressure at home for 1 week or longer.  If you are diagnosed   with hypertension, you may have other blood or imaging tests to help your health care provider understand your overall risk for other conditions. How is this treated? This condition is treated by making healthy lifestyle changes, such as eating healthy foods, exercising more, and reducing your alcohol intake. Your health care provider may prescribe medicine if lifestyle changes are not enough to get your blood pressure under control, and if:  Your systolic blood pressure is above 130.  Your diastolic blood pressure is above 80.  Your personal target blood pressure may vary depending on your medical conditions, your age, and other factors. Follow these instructions at home: Eating and drinking  Eat a diet that is high in fiber and potassium,  and low in sodium, added sugar, and fat. An example eating plan is called the DASH (Dietary Approaches to Stop Hypertension) diet. To eat this way: ? Eat plenty of fresh fruits and vegetables. Try to fill half of your plate at each meal with fruits and vegetables. ? Eat whole grains, such as whole wheat pasta, brown rice, or whole grain bread. Fill about one quarter of your plate with whole grains. ? Eat or drink low-fat dairy products, such as skim milk or low-fat yogurt. ? Avoid fatty cuts of meat, processed or cured meats, and poultry with skin. Fill about one quarter of your plate with lean proteins, such as fish, chicken without skin, beans, eggs, and tofu. ? Avoid premade and processed foods. These tend to be higher in sodium, added sugar, and fat.  Reduce your daily sodium intake. Most people with hypertension should eat less than 1,500 mg of sodium a day.  Limit alcohol intake to no more than 1 drink a day for nonpregnant women and 2 drinks a day for men. One drink equals 12 oz of beer, 5 oz of wine, or 1 oz of hard liquor. Lifestyle  Work with your health care provider to maintain a healthy body weight or to lose weight. Ask what an ideal weight is for you.  Get at least 30 minutes of exercise that causes your heart to beat faster (aerobic exercise) most days of the week. Activities may include walking, swimming, or biking.  Include exercise to strengthen your muscles (resistance exercise), such as pilates or lifting weights, as part of your weekly exercise routine. Try to do these types of exercises for 30 minutes at least 3 days a week.  Do not use any products that contain nicotine or tobacco, such as cigarettes and e-cigarettes. If you need help quitting, ask your health care provider.  Monitor your blood pressure at home as told by your health care provider.  Keep all follow-up visits as told by your health care provider. This is important. Medicines  Take over-the-counter and  prescription medicines only as told by your health care provider. Follow directions carefully. Blood pressure medicines must be taken as prescribed.  Do not skip doses of blood pressure medicine. Doing this puts you at risk for problems and can make the medicine less effective.  Ask your health care provider about side effects or reactions to medicines that you should watch for. Contact a health care provider if:  You think you are having a reaction to a medicine you are taking.  You have headaches that keep coming back (recurring).  You feel dizzy.  You have swelling in your ankles.  You have trouble with your vision. Get help right away if:  You develop a severe headache or confusion.  You   have unusual weakness or numbness.  You feel faint.  You have severe pain in your chest or abdomen.  You vomit repeatedly.  You have trouble breathing. Summary  Hypertension is when the force of blood pumping through your arteries is too strong. If this condition is not controlled, it may put you at risk for serious complications.  Your personal target blood pressure may vary depending on your medical conditions, your age, and other factors. For most people, a normal blood pressure is less than 120/80.  Hypertension is treated with lifestyle changes, medicines, or a combination of both. Lifestyle changes include weight loss, eating a healthy, low-sodium diet, exercising more, and limiting alcohol. This information is not intended to replace advice given to you by your health care provider. Make sure you discuss any questions you have with your health care provider. Document Released: 04/02/2005 Document Revised: 02/29/2016 Document Reviewed: 02/29/2016 Elsevier Interactive Patient Education  2018 Elsevier Inc.  

## 2016-11-14 NOTE — Progress Notes (Signed)
[name], is a 56 y.o. male  OVZ:[id number]  [id number]  DOB - [birth year]  Chief Complaint  Patient presents with  . Hypertension       Subjective:   [name] is a 56 y.o. male with multiple medical history including CAD, ischemic CM, s/p large MI in 2014 with ruptured LV pseudoaneurysm requiring emergent surgery and CABG presents here today for a routine follow up visit and medication refills. Patient has no new complaint today. He claims he is doing well. He denies any depression or suicidal thoughts. He had colonoscopy in March this year, multiple polyps removed which showed mostly benign findings, no malignancy identified. Patient has No headache, No chest pain, No abdominal pain - No Nausea, No new weakness tingling or numbness, No Cough - SOB. He continues to smoke cigarette.  ALLERGIES: No Known Allergies  PAST MEDICAL HISTORY: Past Medical History:  Diagnosis Date  . Abnormal EKG    hx of ischemia showing up on ekg's  . Acute myopericarditis    a. 03/2013: readm with hypoxia, tachycardia, elevated ESR/CRP, elevated troponin with 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and myopericarditis; treated with steroids.  . Acute respiratory failure ([place])    a. 01/2013: VDRF. b. 03/2013: hypoxia requiring supp O2 during adm, resolved by discharge.  . C. difficile colitis    a. 01/2013 during prolonged adm. b. Recurred 03/2013.  . Cardiac tamponade    a. 01/2013 s/p drain.  . Coronary artery disease    a. s/p MI in 2009 in [place] with stenting of the LCX and LAD;  b. 12/2012 NSTEMI/CAD: LM nl, LAD patent prox stent, LCX 50-70 isr (FFR 0.84), RCA dom, 149m EF 40-45%-->Med Rx. c. 01/2013: anterolateral STEMI complicated by pericardial effusion (presumed purulent pericarditis) and tamponade s/p drain, ruptured LV pseudoaneurysm s/p CorMatrix patch, CABGx1 (SVG-OM1), fever, CVA, VDRF, C diff.  . CVA (cerebral infarction)    a. 01/2013 in setting of prolonged hospitalization - residual L arm  weakness.  . 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 03/2013: readm with hypoxia, tachycardia, elevated ESR/CRP, elevated troponin with 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and myopericarditis; treated with steroids.  . Hyperlipidemia   . Hypokalemia   . Hyponatremia    a. During late 2014.  . Ischemic cardiomyopathy    a. Sept/Oct 2014: EF ~40% (ICM). b. 03/2013: EF 25-30%. Off ACEI due to hypotension (MIXED NICM/ICM).  .[place]ocardial infarction ([facility]  . Pericardial effusion    a. 01/2013: pericardial effusion (presumed purulent pericarditis) and cardiac tamponade s/p drain. b. Persistent moderate pericardial effusion 03/2013.  . Pseudoaneurysm of left ventricle of heart    a. 01/2013:  Ruptured inferoposterior LV pseudoaneurysm s/p CorMatrix patch.  . Sinus bradycardia    asymptomatic  . Stroke ([facility] 2009   weak lt side-lt arm  . Tobacco abuse    a. quit 12/2012.  . Wears dentures    top    MEDICATIONS AT HOME: Prior to Admission medications   Medication Sig Start Date End Date Taking? Authorizing Provider  aspirin 81 MG EC tablet Take 1 tablet (81 mg total) by mouth daily. 06/22/13   JTresa Garter MD  carvedilol (COREG) 25 MG tablet Take 1 tablet (25 mg total) by mouth 2 (two) times daily with a meal. 11/14/16   JTresa Garter MD  Cholecalciferol (VITAMIN D) 2000 units tablet Take 2,000 Units by mouth daily.    [provider]  clopidogrel (PLAVIX) 75 MG tablet Take 1 tablet (75 mg total) by mouth daily. 11/14/16  Tresa Garter, MD  latanoprost (XALATAN) 0.005 % ophthalmic solution Place 1 drop into both eyes daily. 05/31/16   [provider]  lisinopril (PRINIVIL,ZESTRIL) 10 MG tablet Take 1 tablet (10 mg total) by mouth 2 (two) times daily. 11/14/16   Tresa Garter, MD  Multiple Vitamins-Minerals (CENTRUM ADULTS PO) Take 1 tablet by mouth daily.     [provider]  nitroGLYCERIN (NITROSTAT) 0.4 MG SL tablet Place 1 tablet (0.4 mg total) under the tongue  every 5 (five) minutes as needed for chest pain (up to 3 doses). 04/17/13   Darlin Coco, MD  simvastatin (ZOCOR) 40 MG tablet Take 1 tablet (40 mg total) by mouth daily at 6 PM. 11/14/16   Tresa Garter, MD    Objective:   Vitals:   11/14/16 1149  BP: (!) 150/79  Pulse: (!) 59  Resp: 16  Temp: 98.2 F (36.8 C)  TempSrc: Oral  SpO2: 95%  Weight: 250 lb 6.4 oz (113.6 kg)   Exam General appearance : Awake, alert, not in any distress. Speech Clear. Not toxic looking HEENT: Atraumatic and Normocephalic, pupils equally reactive to light and accomodation Neck: Supple, no JVD. No cervical lymphadenopathy.  Chest: Good air entry bilaterally, no added sounds  CVS: S1 S2 regular, no murmurs.  Abdomen: Bowel sounds present, Non tender and not distended with no gaurding, rigidity or rebound. Extremities: B/L Lower Ext shows no edema, both legs are warm to touch Neurology: Awake alert, and oriented X 3, CN II-XII intact, Non focal Skin: No Rash  Data Review Lab Results  Component Value Date   HGBA1C 5.4 06/22/2013   HGBA1C 5.7 (H) 01/05/2013    Assessment & Plan   1. Essential hypertension  - carvedilol (COREG) 25 MG tablet; Take 1 tablet (25 mg total) by mouth 2 (two) times daily with a meal.  Dispense: 180 tablet; Refill: 3 - clopidogrel (PLAVIX) 75 MG tablet; Take 1 tablet (75 mg total) by mouth daily.  Dispense: 90 tablet; Refill: 2 - lisinopril (PRINIVIL,ZESTRIL) 10 MG tablet; Take 1 tablet (10 mg total) by mouth 2 (two) times daily.  Dispense: 180 tablet; Refill: 3  2. Dyslipidemia  - simvastatin (ZOCOR) 40 MG tablet; Take 1 tablet (40 mg total) by mouth daily at 6 PM.  Dispense: 90 tablet; Refill: 3  3. Smoking addiction  [name] was counseled on the dangers of tobacco use, and was advised to quit. Reviewed strategies to maximize success, including removing cigarettes and smoking materials from environment, stress management and support of family/friends.  4.  Healthcare maintenance  - Hepatitis c antibody (reflex) - HIV antibody (with reflex) - Tdap vaccine greater than or equal to 7yo IM  Patient have been counseled extensively about nutrition and exercise. Other issues discussed during this visit include: low cholesterol diet, weight control and daily exercise, foot care, annual eye examinations at Ophthalmology, importance of adherence with medications and regular follow-up. We also discussed long term complications of uncontrolled diabetes and hypertension.   Return in about 6 months (around 05/17/2017) for Follow up HTN, Annual Physical.  The patient was given clear instructions to go to ER or return to medical center if symptoms don't improve, worsen or new problems develop. The patient verbalized understanding. The patient was told to call to get lab results if they haven't heard anything in the next week.   This note has been created with Surveyor, quantity. Any transcriptional errors are unintentional.  Angelica Chessman, MD, MHA, Karilyn Cota, [place] and [place], [place]   11/14/2016, 12:20 PM

## 2016-11-15 ENCOUNTER — Other Ambulatory Visit: Payer: Managed Care, Other (non HMO)

## 2016-12-27 ENCOUNTER — Other Ambulatory Visit: Payer: Self-pay | Admitting: Cardiology

## 2017-01-09 ENCOUNTER — Other Ambulatory Visit: Payer: Self-pay | Admitting: Cardiology

## 2017-02-02 NOTE — Progress Notes (Signed)
Cardiology Office Note   Date:  02/06/2017   ID:  [name], DOB [birth year], MRN [id number]  PCP:  Tresa Garter, MD  Cardiologist: Peter Martinique MD  Chief Complaint  Patient presents with  . Follow-up    6 months. No complaints.  . Coronary Artery Disease  . Congestive Heart Failure       History of Present Illness: [name] is a 56 y.o. male who presents for follow up CAD and CHF.  He has a  history of ischemic heart disease. He had stents of the LCx and LAD in 2007. In September 2014 when he presented with chest pain and cardiac cath showed a totally occluded right and a 50% in-stent restenosis of the circumflex stent. Initial medical therapy recommended.  The patient was readmitted  in October 2014 and was found to have a large pericardial effusion with tamponade due to ruptured LV pseudoaneurysm.. A pericardial drain was placed. He was almost ready for discharge when he developed signs of a stroke with left arm weakness and left visual field defect. He then developed hemodynamic collapse with hypotension and tachycardia and fever of 104 and required intubation. Repeat echo showed complex pericardial effusion with right ventricular collapse and tamponade and he responded to volume resuscitation and went on to have emergent median sternotomy, repair of ruptured inferoposterior left ventricular pseudoaneurysm with a core matrix patch and CABG x1 with saphenous vein graft to the obtuse marginal by Dr. Roxan Hockey. 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 Echo showed moderate pericardial effusion. He also had recurrent C. difficile colitis. His ejection fraction by echo was 25% and a sedimentation rate was elevated.  The patient had an echocardiogram on 11/26/13 showing ejection fraction 30%.. His most recent echocardiogram of 05/20/15 shows an ejection  fraction of 30-35%. He was seen by Dr. Rayann Heman for consideration of ICD but the patient declined.   In March 2018 he had colonoscopy for positive Cologuard test. This revealed 3 polyps that were removed.   On follow up today he is doing very well. 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 He notes minimal residual weakness on left side from CVA. The patient denies any exertional dyspnea or chest pain. He is not having any dizziness or syncope. He is walking daily.  Still smokes an occasional cigarette.   Past Medical History:  Diagnosis Date  . Abnormal EKG    hx of ischemia showing up on ekg's  . Acute myopericarditis    a. 03/2013: readm with hypoxia, tachycardia, elevated ESR/CRP, elevated troponin with 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and myopericarditis; treated with steroids.  . Acute respiratory failure ([place])    a. 01/2013: VDRF. b. 03/2013: hypoxia requiring supp O2 during adm, resolved by discharge.  . C. difficile colitis    a. 01/2013 during prolonged adm. b. Recurred 03/2013.  . Cardiac tamponade    a. 01/2013 s/p drain.  . Coronary artery disease    a. s/p MI in 2009 in [place] with stenting of the LCX and LAD;  b. 12/2012 NSTEMI/CAD: LM nl, LAD patent prox stent, LCX 50-70 isr (FFR 0.84), RCA dom, 136m EF 40-45%-->Med Rx. c. 01/2013: anterolateral STEMI complicated by pericardial effusion (presumed purulent pericarditis) and tamponade s/p drain, ruptured LV pseudoaneurysm s/p CorMatrix patch, CABGx1 (SVG-OM1), fever, CVA, VDRF, C diff.  . CVA (cerebral infarction)    a. 01/2013 in setting of prolonged  hospitalization - residual L arm weakness.  . 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 03/2013: readm with hypoxia, tachycardia, elevated ESR/CRP, elevated troponin with 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and myopericarditis; treated with steroids.  . Hyperlipidemia   . Hypokalemia   . Hyponatremia    a. During late 2014.  . Ischemic cardiomyopathy    a. Sept/Oct 2014: EF ~40% (ICM). b. 03/2013: EF 25-30%. Off ACEI due to  hypotension (MIXED NICM/ICM).  Marland Kitchen Myocardial infarction ([place]) 2009  . Pericardial effusion    a. 01/2013: pericardial effusion (presumed purulent pericarditis) and cardiac tamponade s/p drain. b. Persistent moderate pericardial effusion 03/2013.  . Pseudoaneurysm of left ventricle of heart    a. 01/2013:  Ruptured inferoposterior LV pseudoaneurysm s/p CorMatrix patch.  . Sinus bradycardia    asymptomatic  . Stroke [facility]) 2009   weak lt side-lt arm  . Tobacco abuse    a. quit 12/2012.  . Wears dentures    top    Past Surgical History:  Procedure Laterality Date  . CARDIAC CATHETERIZATION  01/06/2013  . COLONOSCOPY WITH PROPOFOL N/A 07/03/2016   Procedure: COLONOSCOPY WITH PROPOFOL;  Surgeon: Manus Gunning, MD;  Location: [facility];  Service: Gastroenterology;  Laterality: N/A;  . CORONARY ANGIOPLASTY WITH STENT PLACEMENT  2000's X 2   "1 + 1" (01/06/2013)  . CORONARY ARTERY BYPASS GRAFT N/A 01/28/2013   Procedure: CORONARY ARTERY BYPASS GRAFTING (CABG);  Surgeon: Melrose Nakayama, MD;  Location: [place];  Service: Open Heart Surgery;  Laterality: N/A;  CABG x one, using left greater saphenous vein harvested endoscopically  . INTRAOPERATIVE TRANSESOPHAGEAL ECHOCARDIOGRAM N/A 01/28/2013   Procedure: INTRAOPERATIVE TRANSESOPHAGEAL ECHOCARDIOGRAM;  Surgeon: Melrose Nakayama, MD;  Location: [place];  Service: Open Heart Surgery;  Laterality: N/A;  . LEFT HEART CATHETERIZATION WITH CORONARY ANGIOGRAM N/A 01/06/2013   Procedure: LEFT HEART CATHETERIZATION WITH CORONARY ANGIOGRAM;  Surgeon: Peter M Martinique, MD;  Location: [facility] CATH LAB;  Service: Cardiovascular;  Laterality: N/A;  . LEFT HEART CATHETERIZATION WITH CORONARY ANGIOGRAM N/A 01/21/2013   Procedure: LEFT HEART CATHETERIZATION WITH CORONARY ANGIOGRAM;  Surgeon: Burnell Blanks, MD;  Location: [facility] CATH LAB;  Service: Cardiovascular;  Laterality: N/A;  . MASS EXCISION Right 07/21/2014   Procedure: EXCISION RIGHT NECK MASS;   Surgeon: Erroll Luna, MD;  Location: [place];  Service: General;  Laterality: Right;  . PERICARDIAL TAP N/A 01/24/2013   Procedure: PERICARDIAL TAP;  Surgeon: Blane Ohara, MD;  Location: [facility] CATH LAB;  Service: Cardiovascular;  Laterality: N/A;  . RIGHT HEART CATHETERIZATION Right 01/24/2013   Procedure: RIGHT HEART CATH;  Surgeon: Blane Ohara, MD;  Location: [facility] CATH LAB;  Service: Cardiovascular;  Laterality: Right;  . VENTRICULAR ANEURYSM RESECTION N/A 01/28/2013   Procedure: LEFT VENTRICULAR ANEURYSM REPAIR;  Surgeon: Melrose Nakayama, MD;  Location: [place];  Service: Open Heart Surgery;  Laterality: N/A;     Current Outpatient Prescriptions  Medication Sig Dispense Refill  . aspirin 81 MG EC tablet Take 1 tablet (81 mg total) by mouth daily. 90 tablet 3  . carvedilol (COREG) 25 MG tablet Take 1 tablet (25 mg total) by mouth 2 (two) times daily with a meal. 180 tablet 3  . carvedilol (COREG) 25 MG tablet TAKE 1 TABLET (25 MG TOTAL) BY MOUTH 2 (TWO) TIMES DAILY WITH A MEAL. 180 tablet 0  . Cholecalciferol (VITAMIN D) 2000 units tablet Take 2,000 Units by mouth daily.    Marland Kitchen latanoprost (XALATAN) 0.005 % ophthalmic solution  Place 1 drop into both eyes daily.  12  . lisinopril (PRINIVIL,ZESTRIL) 10 MG tablet Take 1 tablet (10 mg total) by mouth 2 (two) times daily. 180 tablet 3  . Multiple Vitamins-Minerals (CENTRUM ADULTS PO) Take 1 tablet by mouth daily.     . nitroGLYCERIN (NITROSTAT) 0.4 MG SL tablet Place 1 tablet (0.4 mg total) under the tongue every 5 (five) minutes as needed for chest pain (up to 3 doses). 25 tablet 0  . simvastatin (ZOCOR) 40 MG tablet Take 1 tablet (40 mg total) by mouth daily at 6 PM. 90 tablet 3  . sildenafil (VIAGRA) 100 MG tablet Take 1 tablet (100 mg total) by mouth daily as needed for erectile dysfunction. 10 tablet 6   No current facility-administered medications for this visit.     Allergies:   Patient has no known allergies.     Social History:  The patient  reports that he has been smoking Cigarettes.  He has smoked for the past 35.00 years. He has never used smokeless tobacco. He reports that he does not drink alcohol or use drugs.   Family History:  The patient's family history includes CAD in his unknown relative; Diabetes in his mother; HIV in his brother; Heart attack in his mother; Hypertension in his father and mother.    ROS:  Please see the history of present illness.   Otherwise, review of systems are positive for none.   All other systems are reviewed and negative.    PHYSICAL EXAM: VS:  BP 106/72   Pulse 64   Ht 6' 1"  (1.854 m)   Wt 255 lb (115.7 kg)   BMI 33.64 kg/m  , BMI Body mass index is 33.64 kg/m. GENERAL:  Well appearing, obese BM in NAD HEENT:  PERRL, EOMI, sclera are clear. Oropharynx is clear. NECK:  No jugular venous distention, carotid upstroke brisk and symmetric, no bruits, no thyromegaly or adenopathy LUNGS:  Clear to auscultation bilaterally CHEST:  Unremarkable HEART:  RRR,  PMI not displaced or sustained,S1 and S2 within normal limits, no S3, no S4: no clicks, no rubs, no murmurs ABD:  Soft, nontender. BS +, no masses or bruits. No hepatomegaly, no splenomegaly EXT:  2 + pulses throughout, no edema, no cyanosis no clubbing SKIN:  Warm and dry.  No rashes NEURO:  Alert and oriented x 3. Cranial nerves II through XII intact. PSYCH:  Cognitively intact     EKG:  EKG is not ordered today.    Recent Labs: 05/08/2016: BUN 10; Creatinine, Ser 0.88; Hemoglobin 15.4; Platelets 159; Potassium 3.5; Sodium 134    Lipid Panel    Component Value Date/Time   CHOL 156 11/03/2015 1609   TRIG 97 11/03/2015 1609   HDL 62 11/03/2015 1609   CHOLHDL 2.5 11/03/2015 1609   VLDL 19 11/03/2015 1609   LDLCALC 75 11/03/2015 1609      Wt Readings from Last 3 Encounters:  02/06/17 255 lb (115.7 kg)  11/14/16 250 lb 6.4 oz (113.6 kg)  06/12/16 262 lb 12.8 oz (119.2 kg)        ASSESSMENT AND PLAN:  1. Ischemic heart disease status post remote stenting of the LAD and LCx in 2007.  Presented in September 2014 with MI and had occluded RCA with collaterals. Returned with pericardial tamponade due to ruptured PSA of LV. s/p CABG x 1 in October 2014 and  repair of ruptured inferoposterior left ventricular pseudoaneurysm 2. History of Dressler's myopericarditis postoperative- resolved. 3. History of  preoperative stroke with left arm weakness and left visual field defect.  4. Essential hypertension- controlled.  5. Ischemic cardiomyopathy with left ventricular ejection fraction 30-35% by echocardiogram 05/20/15. 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 He is on good medical therapy and is asymptomatic.   We will check fasting lab work today. Stop Plavix. Given Rx for Viagra for erectile dysfunction. I will follow up in 6 months.   Current medicines are reviewed at length with the patient today.  The patient has concerns regarding medicines.  The following changes have been made:  none  Labs/ tests ordered today include:   Orders Placed This Encounter  Procedures  . Basic metabolic panel  . Lipid Panel w/o Chol/HDL Ratio  . Hepatic function panel   Signed, Peter Martinique MD, [facility]   02/06/2017 9:28 AM    [place]

## 2017-02-03 ENCOUNTER — Other Ambulatory Visit: Payer: Self-pay | Admitting: Cardiology

## 2017-02-06 ENCOUNTER — Ambulatory Visit (INDEPENDENT_AMBULATORY_CARE_PROVIDER_SITE_OTHER): Payer: PRIVATE HEALTH INSURANCE | Admitting: Cardiology

## 2017-02-06 ENCOUNTER — Encounter: Payer: Self-pay | Admitting: Cardiology

## 2017-02-06 VITALS — BP 106/72 | HR 64 | Ht 73.0 in | Wt 255.0 lb

## 2017-02-06 DIAGNOSIS — I10 Essential (primary) hypertension: Secondary | ICD-10-CM

## 2017-02-06 DIAGNOSIS — E785 Hyperlipidemia, unspecified: Secondary | ICD-10-CM

## 2017-02-06 DIAGNOSIS — I255 Ischemic cardiomyopathy: Secondary | ICD-10-CM | POA: Diagnosis not present

## 2017-02-06 DIAGNOSIS — I519 Heart disease, unspecified: Secondary | ICD-10-CM | POA: Diagnosis not present

## 2017-02-06 DIAGNOSIS — I25810 Atherosclerosis of coronary artery bypass graft(s) without angina pectoris: Secondary | ICD-10-CM

## 2017-02-06 LAB — LIPID PANEL W/O CHOL/HDL RATIO
CHOLESTEROL TOTAL: 166 mg/dL (ref 100–199)
HDL: 57 mg/dL (ref >39)
LDL Calculated: 91 mg/dL (ref 0–99)
Triglycerides: 88 mg/dL (ref 0–149)
VLDL Cholesterol Cal: 18 mg/dL (ref 5–40)

## 2017-02-06 LAB — HEPATIC FUNCTION PANEL
ALBUMIN: 4.5 g/dL (ref 3.5–5.5)
ALK PHOS: 96 IU/L (ref 39–117)
ALT: 18 IU/L (ref 0–44)
AST: 18 IU/L (ref 0–40)
BILIRUBIN TOTAL: 0.3 mg/dL (ref 0.0–1.2)
BILIRUBIN, DIRECT: 0.1 mg/dL (ref 0.00–0.40)
Total Protein: 7.2 g/dL (ref 6.0–8.5)

## 2017-02-06 LAB — BASIC METABOLIC PANEL
BUN / CREAT RATIO: 14 (ref 9–20)
BUN: 14 mg/dL (ref 6–24)
CHLORIDE: 103 mmol/L (ref 96–106)
CO2: 21 mmol/L (ref 20–29)
Calcium: 9.5 mg/dL (ref 8.7–10.2)
Creatinine, Ser: 0.98 mg/dL (ref 0.76–1.27)
GFR calc non Af Amer: 86 mL/min/{1.73_m2} (ref >59)
GFR, EST AFRICAN AMERICAN: 100 mL/min/{1.73_m2} (ref >59)
GLUCOSE: 108 mg/dL — AB (ref 65–99)
POTASSIUM: 4.7 mmol/L (ref 3.5–5.2)
SODIUM: 141 mmol/L (ref 134–144)

## 2017-02-06 MED ORDER — SILDENAFIL CITRATE 100 MG PO TABS: 100.0000 mg | ORAL_TABLET | Freq: Every day | ORAL | 6 refills | Status: DC | PRN

## 2017-02-06 NOTE — Patient Instructions (Signed)
Discontinue Plavix- continue your other therapy  We will check blood work today  I will see you in 6 months.

## 2017-02-07 ENCOUNTER — Other Ambulatory Visit: Payer: Self-pay

## 2017-02-07 DIAGNOSIS — I25810 Atherosclerosis of coronary artery bypass graft(s) without angina pectoris: Secondary | ICD-10-CM

## 2017-02-07 DIAGNOSIS — E7800 Pure hypercholesterolemia, unspecified: Secondary | ICD-10-CM

## 2017-02-07 MED ORDER — ATORVASTATIN CALCIUM 80 MG PO TABS: 80.0000 mg | ORAL_TABLET | Freq: Every day | ORAL | 6 refills | Status: DC

## 2017-02-11 ENCOUNTER — Telehealth: Payer: Self-pay | Admitting: Cardiology

## 2017-02-11 NOTE — Telephone Encounter (Signed)
New message      Pt c/o medication issue:  1. Name of Medication: clodpedigril  2. How are you currently taking this medication (dosage and times per day)?  1x a day  3. Are you having a reaction (difficulty breathing--STAT)? no  4. What is your medication issue?  Is he suppose to be taking this medication

## 2017-02-11 NOTE — Telephone Encounter (Signed)
Per OV note dated 02-06-17:We will check fasting lab work today. Stop Plavix. Given Rx for Viagra for erectile dysfunction. I will follow up in 6 months. So, no not supposed to be taking.  Pt notified. He was just wondering because the pharmacy called him and said that we sent new rx. He will call pharmacy and let them know.

## 2017-04-17 ENCOUNTER — Telehealth: Payer: Self-pay | Admitting: Cardiology

## 2017-04-17 NOTE — Telephone Encounter (Signed)
Returned call to patient. Stated he is trying to lose weight, wanted to know if ok to take Hydoxycut.Advised not to take Hydoxycut.

## 2017-04-17 NOTE — Telephone Encounter (Signed)
Patient calling, would like to know if it would be okay to take hydroxycut?

## 2017-06-07 ENCOUNTER — Other Ambulatory Visit: Payer: Self-pay

## 2017-06-07 DIAGNOSIS — I10 Essential (primary) hypertension: Secondary | ICD-10-CM

## 2017-06-07 MED ORDER — CARVEDILOL 25 MG PO TABS: 25.0000 mg | ORAL_TABLET | Freq: Two times a day (BID) | ORAL | 3 refills | Status: DC

## 2017-07-22 DIAGNOSIS — H47013 Ischemic optic neuropathy, bilateral: Secondary | ICD-10-CM | POA: Diagnosis not present

## 2017-07-22 DIAGNOSIS — H04123 Dry eye syndrome of bilateral lacrimal glands: Secondary | ICD-10-CM | POA: Diagnosis not present

## 2017-07-22 DIAGNOSIS — H2513 Age-related nuclear cataract, bilateral: Secondary | ICD-10-CM | POA: Diagnosis not present

## 2017-07-22 DIAGNOSIS — H401233 Low-tension glaucoma, bilateral, severe stage: Secondary | ICD-10-CM | POA: Diagnosis not present

## 2017-09-26 DIAGNOSIS — H2513 Age-related nuclear cataract, bilateral: Secondary | ICD-10-CM | POA: Diagnosis not present

## 2017-09-26 DIAGNOSIS — H401233 Low-tension glaucoma, bilateral, severe stage: Secondary | ICD-10-CM | POA: Diagnosis not present

## 2017-09-26 DIAGNOSIS — H47013 Ischemic optic neuropathy, bilateral: Secondary | ICD-10-CM | POA: Diagnosis not present

## 2017-09-26 DIAGNOSIS — H04123 Dry eye syndrome of bilateral lacrimal glands: Secondary | ICD-10-CM | POA: Diagnosis not present

## 2017-12-02 ENCOUNTER — Ambulatory Visit: Payer: PRIVATE HEALTH INSURANCE | Admitting: Internal Medicine

## 2017-12-07 ENCOUNTER — Other Ambulatory Visit: Payer: Self-pay | Admitting: Internal Medicine

## 2017-12-07 DIAGNOSIS — I10 Essential (primary) hypertension: Secondary | ICD-10-CM

## 2017-12-09 ENCOUNTER — Telehealth: Payer: Self-pay | Admitting: Internal Medicine

## 2017-12-09 DIAGNOSIS — I10 Essential (primary) hypertension: Secondary | ICD-10-CM

## 2017-12-09 NOTE — Telephone Encounter (Signed)
Pt has appt with you on 9/16 can we refill this prior to the appt?

## 2017-12-09 NOTE — Telephone Encounter (Signed)
Patient called requesting lisinopril, patient has appointment 09/16. Patient uses [facility]. Please follow up.

## 2017-12-10 MED ORDER — LISINOPRIL 10 MG PO TABS: 10.0000 mg | ORAL_TABLET | Freq: Two times a day (BID) | ORAL | 0 refills | Status: DC

## 2017-12-30 ENCOUNTER — Encounter: Payer: Self-pay | Admitting: Internal Medicine

## 2017-12-30 ENCOUNTER — Ambulatory Visit: Payer: Medicare HMO | Attending: Internal Medicine | Admitting: Internal Medicine

## 2017-12-30 VITALS — BP 109/74 | HR 56 | Temp 98.3°F | Resp 16 | Ht 73.0 in | Wt 249.2 lb

## 2017-12-30 DIAGNOSIS — F17210 Nicotine dependence, cigarettes, uncomplicated: Secondary | ICD-10-CM | POA: Diagnosis not present

## 2017-12-30 DIAGNOSIS — Z86010 Personal history of colonic polyps: Secondary | ICD-10-CM | POA: Insufficient documentation

## 2017-12-30 DIAGNOSIS — E669 Obesity, unspecified: Secondary | ICD-10-CM | POA: Insufficient documentation

## 2017-12-30 DIAGNOSIS — Z833 Family history of diabetes mellitus: Secondary | ICD-10-CM | POA: Insufficient documentation

## 2017-12-30 DIAGNOSIS — E785 Hyperlipidemia, unspecified: Secondary | ICD-10-CM | POA: Diagnosis not present

## 2017-12-30 DIAGNOSIS — Z955 Presence of coronary angioplasty implant and graft: Secondary | ICD-10-CM | POA: Insufficient documentation

## 2017-12-30 DIAGNOSIS — I10 Essential (primary) hypertension: Secondary | ICD-10-CM | POA: Diagnosis not present

## 2017-12-30 DIAGNOSIS — Z79899 Other long term (current) drug therapy: Secondary | ICD-10-CM | POA: Insufficient documentation

## 2017-12-30 DIAGNOSIS — F17200 Nicotine dependence, unspecified, uncomplicated: Secondary | ICD-10-CM | POA: Diagnosis not present

## 2017-12-30 DIAGNOSIS — Z8673 Personal history of transient ischemic attack (TIA), and cerebral infarction without residual deficits: Secondary | ICD-10-CM | POA: Insufficient documentation

## 2017-12-30 DIAGNOSIS — Z6832 Body mass index (BMI) 32.0-32.9, adult: Secondary | ICD-10-CM | POA: Insufficient documentation

## 2017-12-30 DIAGNOSIS — Z2821 Immunization not carried out because of patient refusal: Secondary | ICD-10-CM

## 2017-12-30 DIAGNOSIS — I25810 Atherosclerosis of coronary artery bypass graft(s) without angina pectoris: Secondary | ICD-10-CM | POA: Insufficient documentation

## 2017-12-30 DIAGNOSIS — N529 Male erectile dysfunction, unspecified: Secondary | ICD-10-CM | POA: Insufficient documentation

## 2017-12-30 DIAGNOSIS — I255 Ischemic cardiomyopathy: Secondary | ICD-10-CM | POA: Insufficient documentation

## 2017-12-30 DIAGNOSIS — I252 Old myocardial infarction: Secondary | ICD-10-CM | POA: Insufficient documentation

## 2017-12-30 DIAGNOSIS — Z8249 Family history of ischemic heart disease and other diseases of the circulatory system: Secondary | ICD-10-CM | POA: Insufficient documentation

## 2017-12-30 DIAGNOSIS — Z951 Presence of aortocoronary bypass graft: Secondary | ICD-10-CM | POA: Insufficient documentation

## 2017-12-30 DIAGNOSIS — Z830 Family history of human immunodeficiency virus [HIV] disease: Secondary | ICD-10-CM | POA: Insufficient documentation

## 2017-12-30 DIAGNOSIS — Z7982 Long term (current) use of aspirin: Secondary | ICD-10-CM | POA: Insufficient documentation

## 2017-12-30 DIAGNOSIS — E66811 Obesity, class 1: Secondary | ICD-10-CM | POA: Insufficient documentation

## 2017-12-30 MED ORDER — ATORVASTATIN CALCIUM 80 MG PO TABS: 80.0000 mg | ORAL_TABLET | Freq: Every day | ORAL | 6 refills | Status: DC

## 2017-12-30 MED ORDER — LISINOPRIL 10 MG PO TABS: 10.0000 mg | ORAL_TABLET | Freq: Two times a day (BID) | ORAL | 6 refills | Status: DC

## 2017-12-30 NOTE — Patient Instructions (Signed)
Follow a Healthy Eating Plan - You can do it! Limit sugary drinks.  Avoid sodas, sweet tea, sport or energy drinks, or fruit drinks.  Drink water, lo-fat milk, or diet drinks. Limit snack foods.   Cut back on candy, cake, cookies, chips, ice cream.  These are a special treat, only in small amounts. Eat plenty of vegetables.  Especially dark green, red, and orange vegetables. Aim for at least 3 servings a day. More is better! Include fruit in your daily diet.  Whole fruit is much healthier than fruit juice! Limit "white" bread, "white" pasta, "white" rice.   Choose "100% whole grain" products, brown or wild rice. Avoid fatty meats. Try "Meatless Monday" and choose eggs or beans one day a week.  When eating meat, choose lean meats like chicken, turkey, and fish.  Grill, broil, or bake meats instead of frying, and eat poultry without the skin. Eat less salt.  Avoid frozen pizzas, frozen dinners and salty foods.  Use seasonings other than salt in cooking.  This can help blood pressure and keep you from swelling Beer, wine and liquor have calories.  If you can safely drink alcohol, limit to 1 drink per day for women, 2 drinks for men  

## 2017-12-30 NOTE — Progress Notes (Signed)
Patient ID: [name], male    DOB: [birth year]  MRN: [id number]  CC: re-establish and Hypertension   Subjective: [name] is a 57 y.o. male who presents for chronic disease management and to establish with me as PCP. His concerns today include:  Patient with history of CAD, ICM, HL, HTN, CVA, tobacco dependence ruptured left ventricular pseudoaneurysm requiring emergency surgery, CABG x1,  CAD/HTN:  No CP, LE edema.  Little SOB at times.  No use of [employer] Works as a Presenter, broadcasting at the [employer]   -still smoking but has cut back a lot.  Was at 2 pks a day now at 1/2 pk/day. -compliant with meds  Obesity:  "I eat stuff that I'm not suppose to be eating.  I eat a lot of fast foods."  Does not cook that much.  Drinks [employer].  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  Patient Active Problem List   Diagnosis Date Noted  . Abnormal stool test   . Benign neoplasm of descending colon   . Benign neoplasm of sigmoid colon   . Trigger finger of right thumb 03/22/2016  . Essential hypertension 11/03/2015  . Cardiomyopathy, ischemic 06/03/2015  . Chronic systolic dysfunction of left ventricle 06/03/2015  . Erectile dysfunction due to arterial insufficiency 12/23/2014  . Colon cancer screening 05/24/2014  . Lipoma of skin and subcutaneous tissue of neck 05/24/2014  . History of cardioembolic stroke 75/17/0017  . Vision loss of right eye 06/23/2013  . Other primary cardiomyopathies 06/22/2013  . ST elevation myocardial infarction (STEMI), subsequent to initial episode of care ([place]) 06/22/2013  . Dyslipidemia 06/22/2013  . Smoking addiction 06/22/2013  . Physical deconditioning 03/25/2013  . Acute respiratory failure ([place])   . 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([place])   . Acute myopericarditis   . Pericardial effusion   . Coronary artery disease   . Cardiomyopathy  ([place])   . Dysphagia   . C. difficile colitis   . Orthostatic hypotension   . Hyponatremia   . CVA (cerebral infarction)   . Anemia due to acute blood loss   . Pseudoaneurysm of left ventricle of heart   . Cardiac tamponade   . Tobacco abuse   . Leucocytosis 02/05/2013  . Acute blood loss anemia 02/05/2013  . Hypokalemia 01/07/2013  . Non-STEMI (non-ST elevated myocardial infarction) ([place]) 01/05/2013     Current Outpatient Medications on File Prior to Visit  Medication Sig Dispense Refill  . aspirin 81 MG EC tablet Take 1 tablet (81 mg total) by mouth daily. 90 tablet 3  . carvedilol (COREG) 25 MG tablet Take 1 tablet (25 mg total) by mouth 2 (two) times daily with a meal. 180 tablet 3  . Cholecalciferol (VITAMIN D) 2000 units tablet Take 2,000 Units by mouth daily.    Marland Kitchen latanoprost (XALATAN) 0.005 % ophthalmic solution Place 1 drop into both eyes daily.  12  . Multiple Vitamins-Minerals (CENTRUM ADULTS PO) Take 1 tablet by mouth daily.     . nitroGLYCERIN (NITROSTAT) 0.4 MG SL tablet Place 1 tablet (0.4 mg total) under the tongue every 5 (five) minutes as needed for chest pain (up to 3 doses). 25 tablet 0  . sildenafil (VIAGRA) 100 MG tablet Take 1 tablet (100 mg total) by mouth daily as needed for erectile dysfunction. 10 tablet 6   No  current facility-administered medications on file prior to visit.     No Known Allergies  Social History   Socioeconomic History  . Marital status: Single    Spouse name: Not on file  . Number of children: 2  . Years of education: 12th  . Highest education level: Not on file  Occupational History  . Occupation: N/A  Social Needs  . Financial resource strain: Not on file  . Food insecurity:    Worry: Not on file    Inability: Not on file  . Transportation needs:    Medical: Not on file    Non-medical: Not on file  Tobacco Use  . Smoking status: Current Every Day Smoker    Years: 35.00    Types: Cigarettes  . Smokeless tobacco: Never  Used  . Tobacco comment: smokes 10-12 cigarettes per week  Substance and Sexual Activity  . Alcohol use: No    Alcohol/week: 0.0 standard drinks  . Drug use: No  . Sexual activity: Yes  Lifestyle  . Physical activity:    Days per week: Not on file    Minutes per session: Not on file  . Stress: Not on file  Relationships  . Social connections:    Talks on phone: Not on file    Gets together: Not on file    Attends religious service: Not on file    Active member of club or organization: Not on file    Attends meetings of clubs or organizations: Not on file    Relationship status: Not on file  . Intimate partner violence:    Fear of current or ex partner: Not on file    Emotionally abused: Not on file    Physically abused: Not on file    Forced sexual activity: Not on file  Other Topics Concern  . Not on file  Social History Narrative   Lives in [place].  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  Quit smoking after MI 12/2012.   Caffeine Use: very rarely    Family History  Problem Relation Age of Onset  . Heart attack Mother   . Diabetes Mother   . Hypertension Mother   . Hypertension Father   . CAD Unknown   . HIV Brother   . Stroke Neg Hx     Past Surgical History:  Procedure Laterality Date  . CARDIAC CATHETERIZATION  01/06/2013  . COLONOSCOPY WITH PROPOFOL N/A 07/03/2016   Procedure: COLONOSCOPY WITH PROPOFOL;  Surgeon: Manus Gunning, MD;  Location: [facility];  Service: Gastroenterology;  Laterality: N/A;  . CORONARY ANGIOPLASTY WITH STENT PLACEMENT  2000's X 2   "1 + 1" (01/06/2013)  . CORONARY ARTERY BYPASS GRAFT N/A 01/28/2013   Procedure: CORONARY ARTERY BYPASS GRAFTING (CABG);  Surgeon: Melrose Nakayama, MD;  Location: [place];  Service: Open Heart Surgery;  Laterality: N/A;  CABG x one, using left greater saphenous vein harvested endoscopically  . INTRAOPERATIVE TRANSESOPHAGEAL ECHOCARDIOGRAM N/A 01/28/2013   Procedure: INTRAOPERATIVE  TRANSESOPHAGEAL ECHOCARDIOGRAM;  Surgeon: Melrose Nakayama, MD;  Location: [place];  Service: Open Heart Surgery;  Laterality: N/A;  . LEFT HEART CATHETERIZATION WITH CORONARY ANGIOGRAM N/A 01/06/2013   Procedure: LEFT HEART CATHETERIZATION WITH CORONARY ANGIOGRAM;  Surgeon: Peter M Martinique, MD;  [facility] C[facility] LAB;  Service: Cardiovascular;  Laterality: N/A;  . LEFT HEART CATHETERIZATION WITH CORONARY ANGIOGRAM N/A 01/21/2013   Procedure: LEFT HEART CATHETERIZATION WITH CORONARY ANGIOGRAM;  Surgeon: Burnell Blanks, MD;  Location[facility]  CATH LAB;  Service: Cardiovascular;  Laterality: N/A;  . MASS EXCISION Right 07/21/2014   Procedure: EXCISION RIGHT NECK MASS;  Surgeon: Erroll Luna, MD;  Location: [place];  Service: General;  Laterality: Right;  . PERICARDIAL TAP N/A 01/24/2013   Procedure: PERICARDIAL TAP;  Surgeon: Blane Ohara, MD;  [facility] CATH LAB;  Service: Cardiovascular;  Laterality: N/A;  . RIGHT HEART CATHETERIZATION Right 01/24/2013   Procedure: RIGHT HEART CATH;  Surgeon: Blane Ohara, MD;  Location: [facility] CATH LAB;  Service: Cardiovascular;  Laterality: Right;  . VENTRICULAR ANEURYSM RESECTION N/A 01/28/2013   Procedure: LEFT VENTRICULAR ANEURYSM REPAIR;  Surgeon: Melrose Nakayama, MD;  Location: [place];  Service: Open Heart Surgery;  Laterality: N/A;    ROS: Review of Systems Negative except as above.    PHYSICAL EXAM: BP 109/74   Pulse (!) 56   Temp 98.3 F (36.8 C) (Oral)   Resp 16   Ht 6\' 1"  (1.854 m)   Wt 249 lb 3.2 oz (113 kg)   SpO2 98%   BMI 32.88 kg/m   Wt Readings from Last 3 Encounters:  12/30/17 249 lb 3.2 oz (113 kg)  02/06/17 255 lb (115.7 kg)  11/14/16 250 lb 6.4 oz (113.6 kg)    Physical Exam  General appearance - alert, well appearing, and in no distress Mental status - normal mood, behavior, speech, dress, motor activity, and thought processes Neck - supple, no significant adenopathy Chest - clear to  auscultation, no wheezes, rales or rhonchi, symmetric air entry Heart - normal rate, regular rhythm, normal S1, S2, no murmurs, rubs, clicks or gallops Extremities - peripheral pulses normal, no pedal edema, no clubbing or cyanosis   ASSESSMENT AND PLAN: 1. Coronary artery disease involving coronary bypass graft of native heart without angina pectoris Clinically stable.  Patient to continue his current medications including lisinopril and carvedilol.  Simvastatin was changed to Lipitor by his cardiologist on his last visit with them in October.  However patient states he was not aware of this change and he thinks he was still taking the simvastatin.  I told him to stop the simvastatin and start Lipitor. - CBC - Comprehensive metabolic panel - Lipid panel - atorvastatin (LIPITOR) 80 MG tablet; Take 1 tablet (80 mg total) by mouth daily.  Dispense: 30 tablet; Refill: 6 - lisinopril (PRINIVIL,ZESTRIL) 10 MG tablet; Take 1 tablet (10 mg total) by mouth 2 (two) times daily.  Dispense: 60 tablet; Refill: 6 - Ambulatory referral to Cardiology  2. Essential hypertension At goal. - lisinopril (PRINIVIL,ZESTRIL) 10 MG tablet; Take 1 tablet (10 mg total) by mouth 2 (two) times daily.  Dispense: 60 tablet; Refill: 6  3. Tobacco dependence Patient advised to quit smoking. Discussed health risks associated with smoking including lung and other types of cancers, chronic lung diseases and CV risks.Marland Kitchen Pt patient declines trying nicotine patches or Chantix.  He states that he plans to continue cutting down gradually.  Encouraged him to set a quit date.   4. Hyperlipidemia, unspecified hyperlipidemia type - atorvastatin (LIPITOR) 80 MG tablet; Take 1 tablet (80 mg total) by mouth daily.  Dispense: 30 tablet; Refill: 6  5. Obesity (BMI 30.0-34.9) Healthy eating habits discussed.  Encourage him to cut out sugary drinks including Hawaiian punch, fruit juices, sodas and sweet tea.  Encourage him to cut back on  eating fast foods and try to cook more. - Hemoglobin A1c  6. Influenza vaccination declined Patient was given the  opportunity to ask questions.  Patient verbalized understanding of the plan and was able to repeat key elements of the plan.   Orders Placed This Encounter  Procedures  . Hemoglobin A1c  . CBC  . Comprehensive metabolic panel  . Lipid panel  . Ambulatory referral to Cardiology     Requested Prescriptions   Signed Prescriptions Disp Refills  . atorvastatin (LIPITOR) 80 MG tablet 30 tablet 6    Sig: Take 1 tablet (80 mg total) by mouth daily.  Marland Kitchen lisinopril (PRINIVIL,ZESTRIL) 10 MG tablet 60 tablet 6    Sig: Take 1 tablet (10 mg total) by mouth 2 (two) times daily.    Return in about 4 months (around 05/01/2018).  Karle Plumber, MD, FACP

## 2017-12-31 LAB — COMPREHENSIVE METABOLIC PANEL
ALT: 16 IU/L (ref 0–44)
AST: 20 IU/L (ref 0–40)
Albumin/Globulin Ratio: 1.7 (ref 1.2–2.2)
Albumin: 4.3 g/dL (ref 3.5–5.5)
Alkaline Phosphatase: 105 IU/L (ref 39–117)
BUN/Creatinine Ratio: 13 (ref 9–20)
BUN: 12 mg/dL (ref 6–24)
Bilirubin Total: 0.3 mg/dL (ref 0.0–1.2)
CALCIUM: 9.5 mg/dL (ref 8.7–10.2)
CO2: 22 mmol/L (ref 20–29)
CREATININE: 0.93 mg/dL (ref 0.76–1.27)
Chloride: 103 mmol/L (ref 96–106)
GFR calc Af Amer: 106 mL/min/{1.73_m2} (ref >59)
GFR, EST NON AFRICAN AMERICAN: 91 mL/min/{1.73_m2} (ref >59)
GLUCOSE: 91 mg/dL (ref 65–99)
Globulin, Total: 2.5 g/dL (ref 1.5–4.5)
Potassium: 4.2 mmol/L (ref 3.5–5.2)
Sodium: 140 mmol/L (ref 134–144)
Total Protein: 6.8 g/dL (ref 6.0–8.5)

## 2017-12-31 LAB — CBC
Hematocrit: 43.4 % (ref 37.5–51.0)
Hemoglobin: 14.8 g/dL (ref 13.0–17.7)
MCH: 29.1 pg (ref 26.6–33.0)
MCHC: 34.1 g/dL (ref 31.5–35.7)
MCV: 85 fL (ref 79–97)
PLATELETS: 154 10*3/uL (ref 150–450)
RBC: 5.09 x10E6/uL (ref 4.14–5.80)
RDW: 12.8 % (ref 12.3–15.4)
WBC: 4.7 10*3/uL (ref 3.4–10.8)

## 2017-12-31 LAB — LIPID PANEL
CHOL/HDL RATIO: 2.8 ratio (ref 0.0–5.0)
Cholesterol, Total: 158 mg/dL (ref 100–199)
HDL: 57 mg/dL (ref >39)
LDL CALC: 83 mg/dL (ref 0–99)
Triglycerides: 89 mg/dL (ref 0–149)
VLDL Cholesterol Cal: 18 mg/dL (ref 5–40)

## 2017-12-31 LAB — HEMOGLOBIN A1C
ESTIMATED AVERAGE GLUCOSE: 128 mg/dL
Hgb A1c MFr Bld: 6.1 % — ABNORMAL HIGH (ref 4.8–5.6)

## 2018-01-01 ENCOUNTER — Encounter: Payer: Self-pay | Admitting: Internal Medicine

## 2018-01-01 ENCOUNTER — Telehealth: Payer: Self-pay

## 2018-01-01 DIAGNOSIS — R7303 Prediabetes: Secondary | ICD-10-CM | POA: Insufficient documentation

## 2018-01-01 NOTE — Telephone Encounter (Signed)
Contacted pt to go over lab results pt is aware and doesn't have any questions or concerns 

## 2018-01-06 ENCOUNTER — Ambulatory Visit: Payer: Medicare HMO | Admitting: Pharmacist

## 2018-01-07 ENCOUNTER — Ambulatory Visit: Payer: Medicare HMO | Admitting: Pharmacist

## 2018-01-18 ENCOUNTER — Other Ambulatory Visit: Payer: Self-pay | Admitting: Internal Medicine

## 2018-01-18 DIAGNOSIS — E785 Hyperlipidemia, unspecified: Secondary | ICD-10-CM

## 2018-01-20 NOTE — Progress Notes (Deleted)
Cardiology Office Note:    Date:  01/21/2018   ID:  [name], DOB May 15, 1960, MRN [id number]  PCP:  Ladell Pier, MD  Cardiologist:  Peter Martinique, MD   Referring MD: Ladell Pier, MD   No chief complaint on file. ***  History of Present Illness:    [name] is a 57 y.o. male with a hx of Dressler syndrome, CAD s/p stenting of hte LCx and LAD (2007), heart cath in 2014 for chest pain with totally occluded RCA and 50% ISR of Cx with medical therapy recommended, ischemic cardiomyopathy, pericardial effusion with tamponade due to ruptures LV pseudoaneurysm (01/2013) requiring pericardial drain with hospital course complicated by stroke. He became unstable requiring intubation, found to have complex pericardial effusion with right ventricular collapse and tamponade with emergent sternotomy for repair and CABG x 1 SVG to OM. He was readmitted with dressler syndrome. Echo with LVEF of 30%, patient declined ICD - patient has CDL and drives for a living. He last saw Dr. Martinique in clinic on 02/06/17 and was doing well at that time. Plavix was stopped. He is maintained on ASA, lipitor, coreg, and lisinopril.   He presents today for annual follow up.          Past Medical History:  Diagnosis Date  . Abnormal EKG    hx of ischemia showing up on ekg's  . Acute myopericarditis    a. 03/2013: readm with hypoxia, tachycardia, elevated ESR/CRP, elevated troponin with 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and myopericarditis; treated with steroids.  . Acute respiratory failure ([place])    a. 01/2013: VDRF. b. 03/2013: hypoxia requiring supp O2 during adm, resolved by discharge.  . C. difficile colitis    a. 01/2013 during prolonged adm. b. Recurred 03/2013.  . Cardiac tamponade    a. 01/2013 s/p drain.  . Coronary artery disease    a. s/p MI in 2009 in [place] with stenting of the LCX and LAD;  b. 12/2012 NSTEMI/CAD: LM nl, LAD patent prox stent, LCX 50-70 isr (FFR 0.84), RCA dom, 116m EF  40-45%-->Med Rx. c. 01/2013: anterolateral STEMI complicated by pericardial effusion (presumed purulent pericarditis) and tamponade s/p drain, ruptured LV pseudoaneurysm s/p CorMatrix patch, CABGx1 (SVG-OM1), fever, CVA, VDRF, C diff.  . CVA (cerebral infarction)    a. 01/2013 in setting of prolonged hospitalization - residual L arm weakness.  . 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    a. 03/2013: readm with hypoxia, tachycardia, elevated ESR/CRP, elevated troponin with 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and myopericarditis; treated with steroids.  . Hyperlipidemia   . Hypokalemia   . Hyponatremia    a. During late 2014.  . Ischemic cardiomyopathy    a. Sept/Oct 2014: EF ~40% (ICM). b. 03/2013: EF 25-30%. Off ACEI due to hypotension (MIXED NICM/ICM).  .[place]land [place]ocardial infarction ([place]  . Pericardial effusion    a. 01/2013: pericardial effusion (presumed purulent pericarditis) and cardiac tamponade s/p drain. b. Persistent moderate pericardial effusion 03/2013.  . Pseudoaneurysm of left ventricle of heart    a. 01/2013:  Ruptured inferoposterior LV pseudoaneurysm s/p CorMatrix patch.  . Sinus bradycardia    asymptomatic  . Stroke ([facility] 2009   weak lt side-lt arm  . Tobacco abuse    a. quit 12/2012.  . Wears dentures    top    Past Surgical History:  Procedure Laterality Date  . CARDIAC CATHETERIZATION  01/06/2013  . COLONOSCOPY WITH PROPOFOL N/A 07/03/2016   Procedure: COLONOSCOPY WITH PROPOFOL;  Surgeon: SManus Gunning MD;  Location:  [facility];  Service: Gastroenterology;  Laterality: N/A;  . CORONARY ANGIOPLASTY WITH STENT PLACEMENT  2000's X 2   "1 + 1" (01/06/2013)  . CORONARY ARTERY BYPASS GRAFT N/A 01/28/2013   Procedure: CORONARY ARTERY BYPASS GRAFTING (CABG);  Surgeon: Melrose Nakayama, MD;  Location: [place];  Service: Open Heart Surgery;  Laterality: N/A;  CABG x one, using left greater saphenous vein harvested endoscopically  . INTRAOPERATIVE TRANSESOPHAGEAL ECHOCARDIOGRAM N/A 01/28/2013    Procedure: INTRAOPERATIVE TRANSESOPHAGEAL ECHOCARDIOGRAM;  Surgeon: Melrose Nakayama, MD;  Location: [place];  Service: Open Heart Surgery;  Laterality: N/A;  . LEFT HEART CATHETERIZATION WITH CORONARY ANGIOGRAM N/A 01/06/2013   Procedure: LEFT HEART CATHETERIZATION WITH CORONARY ANGIOGRAM;  Surgeon: Peter M Martinique, MD;  Location[facility] CATH LAB;  Service: Cardiovascular;  Laterality: N/A;  . LEFT HEART CATHETERIZATION WITH CORONARY ANGIOGRAM N/A 01/21/2013   Procedure: LEFT HEART CATHETERIZATION WITH CORONARY ANGIOGRAM;  Surgeon: Burnell Blanks, MD;  Location: [facility] C[facility] LAB;  Service: Cardiovascular;  Laterality: N/A;  . MASS EXCISION Right 07/21/2014   Procedure: EXCISION RIGHT NECK MASS;  Surgeon: Erroll Luna, MD;  Location: [place];  Service: General;  Laterality: Right;  . PERICARDIAL TAP N/A 01/24/2013   Procedure: PERICARDIAL TAP;  Surgeon: Blane Ohara, MD;  Location: [facility] CATH LAB;  Service: Cardiovascular;  Laterality: N/A;  . RIGHT HEART CATHETERIZATION Right 01/24/2013   Procedure: RIGHT HEART CATH;  Surgeon: Blane Ohara, MD;  Location: [facility] CATH LAB;  Service: Cardiovascular;  Laterality: Right;  . VENTRICULAR ANEURYSM RESECTION N/A 01/28/2013   Procedure: LEFT VENTRICULAR ANEURYSM REPAIR;  Surgeon: Melrose Nakayama, MD;  Location: [place];  Service: Open Heart Surgery;  Laterality: N/A;    Current Medications: No outpatient medications have been marked as taking for the 01/21/18 encounter (Appointment) with [name], [place].     Allergies:   Patient has no known allergies.   Social History   Socioeconomic History  . Marital status: Single    Spouse name: Not on file  . Number of children: 2  . Years of education: 12th  . Highest education level: Not on file  Occupational History  . Occupation: N/A  Social Needs  . Financial resource strain: Not on file  . Food insecurity:    Worry: Not on file    Inability: Not on file  .  Transportation needs:    Medical: Not on file    Non-medical: Not on file  Tobacco Use  . Smoking status: Current Every Day Smoker    Years: 35.00    Types: Cigarettes  . Smokeless tobacco: Never Used  . Tobacco comment: smokes 10-12 cigarettes per week  Substance and Sexual Activity  . Alcohol use: No    Alcohol/week: 0.0 standard drinks  . Drug use: No  . Sexual activity: Yes  Lifestyle  . Physical activity:    Days per week: Not on file    Minutes per session: Not on file  . Stress: Not on file  Relationships  . Social connections:    Talks on phone: Not on file    Gets together: Not on file    Attends religious service: Not on file    Active member of club or organization: Not on file    Attends meetings of clubs or organizations: Not on file    Relationship status: Not on file  Other Topics Concern  . Not on file  Social History Narrative   Lives in [place].  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  Quit smoking after MI 12/2012.   Caffeine Use: very rarely     Family History: The patient's  family history includes CAD in his unknown relative; Diabetes in his mother; HIV in his brother; Heart attack in his mother; Hypertension in his father and mother. There is no history of Stroke.  ROS:   Please see the history of present illness.    *** All other systems reviewed and are negative.  EKGs/Labs/Other Studies Reviewed:    The following studies were reviewed today: ***  EKG:  EKG is *** ordered today.  The ekg ordered today demonstrates ***  Recent Labs: 12/30/2017: ALT 16; BUN 12; Creatinine, Ser 0.93; Hemoglobin 14.8; Platelets 154; Potassium 4.2; Sodium 140  Recent Lipid Panel    Component Value Date/Time   CHOL 158 12/30/2017 1450   TRIG 89 12/30/2017 1450   HDL 57 12/30/2017 1450   CHOLHDL 2.8 12/30/2017 1450   CHOLHDL 2.5 11/03/2015 1609   VLDL 19 11/03/2015 1609   LDLCALC 83 12/30/2017 1450    Physical Exam:    VS:  There were no  vitals taken for this visit.    Wt Readings from Last 3 Encounters:  12/30/17 249 lb 3.2 oz (113 kg)  02/06/17 255 lb (115.7 kg)  11/14/16 250 lb 6.4 oz (113.6 kg)     GEN: *** Well nourished, well developed in no acute distress HEENT: Normal NECK: No JVD; No carotid bruits LYMPHATICS: No lymphadenopathy CARDIAC: ***RRR, no murmurs, rubs, gallops RESPIRATORY:  Clear to auscultation without rales, wheezing or rhonchi  ABDOMEN: Soft, non-tender, non-distended MUSCULOSKELETAL:  No edema; No deformity  SKIN: Warm and dry NEUROLOGIC:  Alert and oriented x 3 PSYCHIATRIC:  Normal affect   ASSESSMENT:    No diagnosis found. PLAN:    In order of problems listed above:  No diagnosis found.   Medication Adjustments/Labs and Tests Ordered: Current medicines are reviewed at length with the patient today.  Concerns regarding medicines are outlined above.  No orders of the defined types were placed in this encounter.  No orders of the defined types were placed in this encounter.   Signed, Robert Bottcher, PA  01/21/2018 7:46 AM    [facility]

## 2018-01-21 ENCOUNTER — Ambulatory Visit: Payer: Medicare HMO | Admitting: Physician Assistant

## 2018-01-21 DIAGNOSIS — R0989 Other specified symptoms and signs involving the circulatory and respiratory systems: Secondary | ICD-10-CM

## 2018-02-17 DIAGNOSIS — H53433 Sector or arcuate defects, bilateral: Secondary | ICD-10-CM | POA: Diagnosis not present

## 2018-02-17 DIAGNOSIS — H3589 Other specified retinal disorders: Secondary | ICD-10-CM | POA: Diagnosis not present

## 2018-02-17 DIAGNOSIS — H47293 Other optic atrophy, bilateral: Secondary | ICD-10-CM | POA: Diagnosis not present

## 2018-03-29 ENCOUNTER — Encounter (HOSPITAL_COMMUNITY): Payer: Self-pay

## 2018-03-29 ENCOUNTER — Emergency Department (HOSPITAL_COMMUNITY): Payer: Medicare Other

## 2018-03-29 ENCOUNTER — Other Ambulatory Visit: Payer: Self-pay

## 2018-03-29 ENCOUNTER — Emergency Department (HOSPITAL_COMMUNITY)
Admission: EM | Admit: 2018-03-29 | Discharge: 2018-03-29 | Disposition: A | Discharge status: Home / Self Care | Payer: Medicare Other | Attending: Emergency Medicine | Admitting: Emergency Medicine

## 2018-03-29 DIAGNOSIS — Y999 Unspecified external cause status: Secondary | ICD-10-CM | POA: Diagnosis not present

## 2018-03-29 DIAGNOSIS — Y939 Activity, unspecified: Secondary | ICD-10-CM | POA: Diagnosis not present

## 2018-03-29 DIAGNOSIS — X58XXXA Exposure to other specified factors, initial encounter: Secondary | ICD-10-CM | POA: Insufficient documentation

## 2018-03-29 DIAGNOSIS — I10 Essential (primary) hypertension: Secondary | ICD-10-CM | POA: Diagnosis not present

## 2018-03-29 DIAGNOSIS — S39012A Strain of muscle, fascia and tendon of lower back, initial encounter: Secondary | ICD-10-CM | POA: Diagnosis not present

## 2018-03-29 DIAGNOSIS — S0990XA Unspecified injury of head, initial encounter: Secondary | ICD-10-CM | POA: Diagnosis not present

## 2018-03-29 DIAGNOSIS — F17210 Nicotine dependence, cigarettes, uncomplicated: Secondary | ICD-10-CM | POA: Insufficient documentation

## 2018-03-29 DIAGNOSIS — R52 Pain, unspecified: Secondary | ICD-10-CM | POA: Diagnosis not present

## 2018-03-29 DIAGNOSIS — R55 Syncope and collapse: Secondary | ICD-10-CM | POA: Insufficient documentation

## 2018-03-29 DIAGNOSIS — Z79899 Other long term (current) drug therapy: Secondary | ICD-10-CM | POA: Insufficient documentation

## 2018-03-29 DIAGNOSIS — G4489 Other headache syndrome: Secondary | ICD-10-CM | POA: Diagnosis not present

## 2018-03-29 DIAGNOSIS — Y929 Unspecified place or not applicable: Secondary | ICD-10-CM | POA: Insufficient documentation

## 2018-03-29 DIAGNOSIS — I2510 Atherosclerotic heart disease of native coronary artery without angina pectoris: Secondary | ICD-10-CM | POA: Insufficient documentation

## 2018-03-29 DIAGNOSIS — Z7982 Long term (current) use of aspirin: Secondary | ICD-10-CM | POA: Diagnosis not present

## 2018-03-29 DIAGNOSIS — M545 Low back pain: Secondary | ICD-10-CM | POA: Diagnosis not present

## 2018-03-29 DIAGNOSIS — S199XXA Unspecified injury of neck, initial encounter: Secondary | ICD-10-CM | POA: Diagnosis not present

## 2018-03-29 DIAGNOSIS — R0602 Shortness of breath: Secondary | ICD-10-CM | POA: Diagnosis not present

## 2018-03-29 DIAGNOSIS — W19XXXA Unspecified fall, initial encounter: Secondary | ICD-10-CM | POA: Diagnosis not present

## 2018-03-29 LAB — CBC WITH DIFFERENTIAL/PLATELET
Abs Immature Granulocytes: 0.02 10*3/uL (ref 0.00–0.07)
BASOS ABS: 0 10*3/uL (ref 0.0–0.1)
Basophils Relative: 1 %
Eosinophils Absolute: 0.1 10*3/uL (ref 0.0–0.5)
Eosinophils Relative: 1 %
HEMATOCRIT: 46.8 % (ref 39.0–52.0)
HEMOGLOBIN: 15 g/dL (ref 13.0–17.0)
IMMATURE GRANULOCYTES: 0 %
LYMPHS ABS: 1.9 10*3/uL (ref 0.7–4.0)
Lymphocytes Relative: 29 %
MCH: 28.4 pg (ref 26.0–34.0)
MCHC: 32.1 g/dL (ref 30.0–36.0)
MCV: 88.5 fL (ref 80.0–100.0)
Monocytes Absolute: 0.7 10*3/uL (ref 0.1–1.0)
Monocytes Relative: 10 %
NEUTROS ABS: 3.9 10*3/uL (ref 1.7–7.7)
NEUTROS PCT: 59 %
NRBC: 0 % (ref 0.0–0.2)
Platelets: 165 10*3/uL (ref 150–400)
RBC: 5.29 MIL/uL (ref 4.22–5.81)
RDW: 13.2 % (ref 11.5–15.5)
WBC: 6.5 10*3/uL (ref 4.0–10.5)

## 2018-03-29 LAB — COMPREHENSIVE METABOLIC PANEL
ALT: 23 U/L (ref 0–44)
AST: 22 U/L (ref 15–41)
Albumin: 4.1 g/dL (ref 3.5–5.0)
Alkaline Phosphatase: 124 U/L (ref 38–126)
Anion gap: 9 (ref 5–15)
BUN: 10 mg/dL (ref 6–20)
CO2: 25 mmol/L (ref 22–32)
Calcium: 9.8 mg/dL (ref 8.9–10.3)
Chloride: 104 mmol/L (ref 98–111)
Creatinine, Ser: 0.96 mg/dL (ref 0.61–1.24)
GFR calc non Af Amer: >60 mL/min (ref >60)
Glucose, Bld: 103 mg/dL — ABNORMAL HIGH (ref 70–99)
POTASSIUM: 4.1 mmol/L (ref 3.5–5.1)
SODIUM: 138 mmol/L (ref 135–145)
Total Bilirubin: 0.9 mg/dL (ref 0.3–1.2)
Total Protein: 7.4 g/dL (ref 6.5–8.1)

## 2018-03-29 LAB — TROPONIN I: Troponin I: <0.03 ng/mL (ref <0.03)

## 2018-03-29 MED ORDER — IOPAMIDOL (ISOVUE-370) INJECTION 76%
50.0000 mL | Freq: Once | INTRAVENOUS | Status: AC | PRN
  Administered 2018-03-29: 50 mL via INTRAVENOUS

## 2018-03-29 MED ORDER — NAPROXEN 500 MG PO TABS: 500.0000 mg | ORAL_TABLET | Freq: Two times a day (BID) | ORAL | 0 refills | Status: AC

## 2018-03-29 MED ORDER — METHOCARBAMOL 500 MG PO TABS
500.0000 mg | ORAL_TABLET | Freq: Once | ORAL | Status: AC
  Administered 2018-03-29: 500 mg via ORAL
  Filled 2018-03-29: qty 1

## 2018-03-29 MED ORDER — METHOCARBAMOL 500 MG PO TABS: 500.0000 mg | ORAL_TABLET | Freq: Two times a day (BID) | ORAL | 0 refills | Status: AC

## 2018-03-29 MED ORDER — METHOCARBAMOL 500 MG PO TABS: 500.0000 mg | ORAL_TABLET | Freq: Two times a day (BID) | ORAL | 0 refills | Status: DC

## 2018-03-29 MED ORDER — IOPAMIDOL (ISOVUE-370) INJECTION 76%
INTRAVENOUS | Status: AC
  Filled 2018-03-29: qty 100

## 2018-03-29 MED ORDER — HYDROCODONE-ACETAMINOPHEN 5-325 MG PO TABS
1.0000 | ORAL_TABLET | Freq: Once | ORAL | Status: AC
Start: 2018-03-29 — End: 2018-03-29
  Administered 2018-03-29: 1 via ORAL
  Filled 2018-03-29: qty 1

## 2018-03-29 MED ORDER — NAPROXEN 500 MG PO TABS: 500.0000 mg | ORAL_TABLET | Freq: Two times a day (BID) | ORAL | 0 refills | Status: DC

## 2018-03-29 NOTE — ED Provider Notes (Signed)
[facility] EMERGENCY DEPARTMENT Provider Note   CSN: [id number] Arrival date & time: 03/29/18  1224     History   Chief Complaint Chief Complaint  Patient presents with  . Loss of Consciousness  . Back Pain    HPI [name] is a 57 y.o. male.  57 y.o male smoker with  PMH of lipidemia, hyponatremia, cardiac tamponade, CAD, CVA presents to the ED via EMS with a chief complaint of syncope.  Patient was helping a friend move while carrying a piece of furniture and reports he was walking backwards when he felt a sudden pain on the right side of his lower back, he describes it as sharp, he reports getting dizzy and falling backwards.  Reports the pain is worse with movement or changing positions.  He reports he did strike his head and lost consciousness.  Reports per friends he had some slurred speech when turning to his baseline. He was nonambulatory after this syncope. Also endorses some dizziness along with some shortness of breath with exertion.  He also endorses a headache on the occipital region reports it as sharp.Does have an extensive cardiac history along with a CABG in 2015, he does have a cardiologist Dr. Martinique who saw him last a year ago for an echo and stress test.  He is currently not on any blood thinners.He denies any fever, urinary symptoms, abdominal pain, bowel or bladder incontinence.      Past Medical History:  Diagnosis Date  . Abnormal EKG    hx of ischemia showing up on ekg's  . Acute myopericarditis    a. 03/2013: readm with hypoxia, tachycardia, elevated ESR/CRP, elevated troponin with 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and myopericarditis; treated with steroids.  . Acute respiratory failure ([place])    a. 01/2013: VDRF. b. 03/2013: hypoxia requiring supp O2 during adm, resolved by discharge.  . C. difficile colitis    a. 01/2013 during prolonged adm. b. Recurred 03/2013.  . Cardiac tamponade    a. 01/2013 s/p drain.  . Coronary artery disease    a. s/p MI in 2009 in [place] with stenting of the LCX and LAD;  b. 12/2012 NSTEMI/CAD: LM nl, LAD patent prox stent, LCX 50-70 isr (FFR 0.84), RCA dom, 168m EF 40-45%-->Med Rx. c. 01/2013: anterolateral STEMI complicated by pericardial effusion (presumed purulent pericarditis) and tamponade s/p drain, ruptured LV pseudoaneurysm s/p CorMatrix patch, CABGx1 (SVG-OM1), fever, CVA, VDRF, C diff.  . CVA (cerebral infarction)    a. 01/2013 in setting of prolonged hospitalization - residual L arm weakness.  . 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([place]    a. 03/2013: readm with hypoxia, tachycardia, elevated ESR/CRP, elevated troponin with 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and myopericarditis; treated with steroids.  . Hyperlipidemia   . Hypokalemia   . Hyponatremia    a. During late 2014.  . Ischemic cardiomyopathy    a. Sept/Oct 2014: EF ~40% (ICM). b. 03/2013: EF 25-30%. Off ACEI due to hypotension (MIXED NICM/ICM).  .[place] Kitchen[place]ocardial infarction [place]  . Pericardial effusion    a. 01/2013: pericardial effusion (presumed purulent pericarditis) and cardiac tamponade s/p drain. b. Persistent moderate pericardial effusion 03/2013.  . Pseudoaneurysm of left ventricle of heart    a. 01/2013:  Ruptured inferoposterior LV pseudoaneurysm s/p CorMatrix patch.  . Sinus bradycardia    asymptomatic  . Stroke ([facility] 2009   weak lt side-lt arm  . Tobacco abuse    a. quit 12/2012.  . Wears dentures    top    Patient Active Problem  List   Diagnosis Date Noted  . Prediabetes 01/01/2018  . Hyperlipidemia 12/30/2017  . Obesity (BMI 30.0-34.9) 12/30/2017  . Benign neoplasm of descending colon   . Essential hypertension 11/03/2015  . Cardiomyopathy, ischemic 06/03/2015  . Chronic systolic dysfunction of left ventricle 06/03/2015  . Erectile dysfunction due to arterial insufficiency 12/23/2014  . Lipoma of skin and subcutaneous tissue of neck 05/24/2014  . History of cardioembolic stroke 87/56/4332  . Vision loss of right eye 06/23/2013  .  Dyslipidemia 06/22/2013  . 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([place])   . Coronary artery disease   . Cardiomyopathy ([employer])   . Pseudoaneurysm of left ventricle of heart   . Tobacco abuse     Past Surgical History:  Procedure Laterality Date  . CARDIAC CATHETERIZATION  01/06/2013  . COLONOSCOPY WITH PROPOFOL N/A 07/03/2016   Procedure: COLONOSCOPY WITH PROPOFOL;  Surgeon: Manus Gunning, MD;  Location: [facility];  Service: Gastroenterology;  Laterality: N/A;  . CORONARY ANGIOPLASTY WITH STENT PLACEMENT  2000's X 2   "1 + 1" (01/06/2013)  . CORONARY ARTERY BYPASS GRAFT N/A 01/28/2013   Procedure: CORONARY ARTERY BYPASS GRAFTING (CABG);  Surgeon: Melrose Nakayama, MD;  Location: [place];  Service: Open Heart Surgery;  Laterality: N/A;  CABG x one, using left greater saphenous vein harvested endoscopically  . INTRAOPERATIVE TRANSESOPHAGEAL ECHOCARDIOGRAM N/A 01/28/2013   Procedure: INTRAOPERATIVE TRANSESOPHAGEAL ECHOCARDIOGRAM;  Surgeon: Melrose Nakayama, MD;  Location: [place];  Service: Open Heart Surgery;  Laterality: N/A;  . LEFT HEART CATHETERIZATION WITH CORONARY ANGIOGRAM N/A 01/06/2013   Procedure: LEFT HEART CATHETERIZATION WITH CORONARY ANGIOGRAM;  Surgeon: Peter M Martinique, MD;  Location: [facility] CATH LAB;  Service: Cardiovascular;  Laterality: N/A;  . LEFT HEART CATHETERIZATION WITH CORONARY ANGIOGRAM N/A 01/21/2013   Procedure: LEFT HEART CATHETERIZATION WITH CORONARY ANGIOGRAM;  Surgeon: Burnell Blanks, MD;  Location: [facility] CATH LAB;  Service: Cardiovascular;  Laterality: N/A;  . MASS EXCISION Right 07/21/2014   Procedure: EXCISION RIGHT NECK MASS;  Surgeon: Erroll Luna, MD;  Location: [place];  Service: General;  Laterality: Right;  . PERICARDIAL TAP N/A 01/24/2013   Procedure: PERICARDIAL TAP;  Surgeon: Blane Ohara, MD;  Location: [facility] C[facility] LAB;  Service: Cardiovascular;  Laterality: N/A;  . RIGHT HEART CATHETERIZATION Right 01/24/2013   Procedure: RIGHT HEART CATH;   Surgeon: Blane Ohara, MD;  Location: [facility] CATH LAB;  Service: Cardiovascular;  Laterality: Right;  . VENTRICULAR ANEURYSM RESECTION N/A 01/28/2013   Procedure: LEFT VENTRICULAR ANEURYSM REPAIR;  Surgeon: Melrose Nakayama, MD;  Location: [place];  Service: Open Heart Surgery;  Laterality: N/A;        Home Medications    Prior to Admission medications   Medication Sig Start Date End Date Taking? Authorizing Provider  aspirin 81 MG EC tablet Take 1 tablet (81 mg total) by mouth daily. 06/22/13   Tresa Garter, MD  atorvastatin (LIPITOR) 80 MG tablet Take 1 tablet (80 mg total) by mouth daily. 12/30/17   Ladell Pier, MD  carvedilol (COREG) 25 MG tablet Take 1 tablet (25 mg total) by mouth 2 (two) times daily with a meal. 06/07/17   Martinique, Peter M, MD  Cholecalciferol (VITAMIN D) 2000 units tablet Take 2,000 Units by mouth daily.    [provider]  latanoprost (XALATAN) 0.005 % ophthalmic solution Place 1 drop into both eyes daily. 05/31/16   [provider]  lisinopril (PRINIVIL,ZESTRIL) 10 MG tablet Take 1 tablet (10 mg total) by  mouth 2 (two) times daily. 12/30/17   Ladell Pier, MD  methocarbamol (ROBAXIN) 500 MG tablet Take 1 tablet (500 mg total) by mouth 2 (two) times daily for 7 days. 03/29/18 04/05/18  Janeece Fitting, PA-C  Multiple Vitamins-Minerals (CENTRUM ADULTS PO) Take 1 tablet by mouth daily.     [provider]  naproxen (NAPROSYN) 500 MG tablet Take 1 tablet (500 mg total) by mouth 2 (two) times daily for 7 days. 03/29/18 04/05/18  Janeece Fitting, PA-C  nitroGLYCERIN (NITROSTAT) 0.4 MG SL tablet Place 1 tablet (0.4 mg total) under the tongue every 5 (five) minutes as needed for chest pain (up to 3 doses). 04/17/13   Darlin Coco, MD  sildenafil (VIAGRA) 100 MG tablet Take 1 tablet (100 mg total) by mouth daily as needed for erectile dysfunction. 02/06/17   Martinique, Peter M, MD    Family History Family History  Problem Relation Age  of Onset  . Heart attack Mother   . Diabetes Mother   . Hypertension Mother   . Hypertension Father   . CAD Unknown   . HIV Brother   . Stroke Neg Hx     Social History Social History   Tobacco Use  . Smoking status: Current Every Day Smoker    Years: 35.00    Types: Cigarettes  . Smokeless tobacco: Never Used  . Tobacco comment: smokes 10-12 cigarettes per week  Substance Use Topics  . Alcohol use: No    Alcohol/week: 0.0 standard drinks  . Drug use: No     Allergies   Patient has no known allergies.   Review of Systems Review of Systems  Constitutional: Negative for fever.  HENT: Negative for sore throat.   Respiratory: Positive for shortness of breath.   Cardiovascular: Positive for chest pain.  Gastrointestinal: Negative for abdominal pain, nausea and vomiting.  Genitourinary: Negative for dysuria and flank pain.  Musculoskeletal: Negative for back pain.  Skin: Negative for pallor and wound.  Neurological: Positive for syncope and headaches.     Physical Exam Updated Vital Signs BP 113/75   Pulse 63   Temp 98.2 F (36.8 C)   Resp (!) 27   Ht _0  (1.854 m)   Wt 116.1 kg   SpO2 97%   BMI 33.78 kg/m   Physical Exam Vitals signs and nursing note reviewed.  Constitutional:      General: He is not in acute distress.    Appearance: Normal appearance. He is not ill-appearing, toxic-appearing or diaphoretic.  HENT:     Head: Normocephalic. Abrasion present. No raccoon eyes, Battle's sign or contusion.     Comments: Or concerns, battle signs or facial bone tenderness. Multiple abrasions throughout all of his head.     Mouth/Throat:     Mouth: Mucous membranes are moist.     Pharynx: Oropharynx is clear.  Eyes:     Extraocular Movements: Extraocular movements intact.     Pupils: Pupils are equal, round, and reactive to light.  Neck:     Musculoskeletal: Normal range of motion and neck supple.  Cardiovascular:     Rate and Rhythm: Normal rate.      Pulses: Normal pulses.  Pulmonary:     Effort: Pulmonary effort is normal.     Breath sounds: No rhonchi.  Abdominal:     General: Abdomen is flat. Bowel sounds are normal. There is no distension.     Tenderness: There is no right CVA tenderness or left CVA tenderness.  Musculoskeletal:     Lumbar back: He exhibits pain and spasm.       Arms:     Right lower leg: No edema.     Left lower leg: No edema.  Skin:    General: Skin is warm and dry.  Neurological:     General: No focal deficit present.     Mental Status: He is alert and oriented to person, place, and time.     Comments: Alert, oriented, thought content appropriate. Speech fluent without evidence of aphasia. Able to follow 2 step commands without difficulty.  Cranial Nerves:  II:  Peripheral visual fields grossly normal, pupils, round, reactive to light III,IV, VI: ptosis not present, extra-ocular motions intact bilaterally  V,VII: smile symmetric, facial light touch sensation equal VIII: hearing grossly normal bilaterally  IX,X: midline uvula rise  XI: bilateral shoulder shrug equal and strong XII: midline tongue extension  Motor:  5/5 in upper and lower extremities bilaterally including strong and equal grip strength and dorsiflexion/plantar flexion Sensory: light touch normal in all extremities.  Cerebellar: normal finger-to-nose with bilateral upper extremities, pronator drift negative Gait: normal gait and balance       ED Treatments / Results  Labs (all labs ordered are listed, but only abnormal results are displayed) Labs Reviewed  COMPREHENSIVE METABOLIC PANEL - Abnormal; Notable for the following components:      Result Value   Glucose, Bld 103 (*)    All other components within normal limits  CBC WITH DIFFERENTIAL/PLATELET  TROPONIN I    EKG EKG Interpretation  Date/Time:  Saturday March 29 2018 12:29:51 EST Ventricular Rate:  56 PR Interval:    QRS Duration: 157 QT Interval:  465 QTC  Calculation: 449 R Axis:   -9 Text Interpretation:  Sinus rhythm Prolonged PR interval Nonspecific intraventricular conduction delay Inferolateral infarct, old since last tracing no significant change Confirmed by Noemi Chapel [phone]) on 03/29/2018 1:35:18 PM   Radiology Ct Angio Head W Or Wo Contrast  Result Date: 03/29/2018 CLINICAL DATA:  Lifted heavy furniture. Passed out and hit head. EXAM: CT ANGIOGRAPHY HEAD AND NECK TECHNIQUE: Multidetector CT imaging of the head and neck was performed using the standard protocol during bolus administration of intravenous contrast. Multiplanar CT image reconstructions and MIPs were obtained to evaluate the vascular anatomy. Carotid stenosis measurements (when applicable) are obtained utilizing NASCET criteria, using the distal internal carotid diameter as the denominator. CONTRAST:  60m ISOVUE-370 IOPAMIDOL (ISOVUE-370) INJECTION 76% COMPARISON:  02/02/2013. FINDINGS: CT HEAD FINDINGS Brain: Remote BILATERAL cerebral and cerebellar infarcts. No evidence for acute infarction, hemorrhage, mass lesion, hydrocephalus, or extra-axial fluid. Slight premature for age atrophy. Hypoattenuation of the white matter, consistent with small vessel disease. Vascular: Reported separately. Skull: No skull fracture. Sinuses: Imaged portions are clear. Orbits: No acute finding. Review of the MIP images confirms the above findings CTA NECK FINDINGS Aortic arch: Standard branching. Imaged portion shows no evidence of aneurysm or dissection. No significant stenosis of the major arch vessel origins. Minor calcific atheromatous change of the transverse arch and great vessel origins Right carotid system: No evidence of dissection, stenosis (50% or greater) or occlusion. Left carotid system: No evidence of dissection, stenosis (50% or greater) or occlusion. Minor nonstenotic plaque. Vertebral arteries: BILATERAL patent. RIGHT slightly larger. No ostial narrowing. Skeleton: Spondylosis.  Disc space narrowing. No cervical spine fracture. Poor dentition. Other neck: No acute or significant finding. Upper chest: BILATERAL pleural effusions. Subsegmental atelectasis. No pneumothorax. Median sternotomy. Review of the MIP  images confirms the above findings CTA HEAD FINDINGS Anterior circulation: No significant stenosis, proximal occlusion, aneurysm, or vascular malformation. Posterior circulation: Mild hypoplasia of basilar, likely related to fetal PCA. Dominant RIGHT vertebral. No significant stenosis, occlusion, aneurysm or vascular malformation. Venous sinuses: As permitted by contrast timing, patent. Anatomic variants: Fetal RIGHT PCA. Delayed phase: No abnormal intracranial enhancement. Review of the MIP images confirms the above findings IMPRESSION: 1. No extracranial or intracranial flow reducing lesion or dissection. 2. Multiple remote BILATERAL cerebral and cerebellar infarcts were acute in 2014. No acute intracranial findings. 3. No posttraumatic sequelae are evident. No skull fracture or intracranial hemorrhage. Electronically Signed   By: Staci Righter M.D.   On: 03/29/2018 15:52   Dg Lumbar Spine Complete  Result Date: 03/29/2018 CLINICAL DATA:  Pain in RIGHT low back, felt a pinch in RIGHT lower back while walking backwards helping a friend move a sofa, excruciating pain, syncopal episode and fall striking back EXAM: LUMBAR SPINE - COMPLETE 4+ VIEW COMPARISON:  None FINDINGS: Hypoplastic last RIGHT rib. Five additional non-rib-bearing lumbar type vertebra. Osseous mineralization low normal. Mild scattered disc space narrowing and tiny endplate spurs. Vertebral body heights maintained without fracture or subluxation. No bone destruction or spondylolysis. SI joints preserved. Atherosclerotic calcifications aorta. IMPRESSION: Scattered degenerative disc disease changes lumbar spine. No acute abnormalities. Electronically Signed   By: Lavonia Dana M.D.   On: 03/29/2018 14:41   Ct Angio  Neck W And/or Wo Contrast  Result Date: 03/29/2018 CLINICAL DATA:  Lifted heavy furniture. Passed out and hit head. EXAM: CT ANGIOGRAPHY HEAD AND NECK TECHNIQUE: Multidetector CT imaging of the head and neck was performed using the standard protocol during bolus administration of intravenous contrast. Multiplanar CT image reconstructions and MIPs were obtained to evaluate the vascular anatomy. Carotid stenosis measurements (when applicable) are obtained utilizing NASCET criteria, using the distal internal carotid diameter as the denominator. CONTRAST:  4m ISOVUE-370 IOPAMIDOL (ISOVUE-370) INJECTION 76% COMPARISON:  02/02/2013. FINDINGS: CT HEAD FINDINGS Brain: Remote BILATERAL cerebral and cerebellar infarcts. No evidence for acute infarction, hemorrhage, mass lesion, hydrocephalus, or extra-axial fluid. Slight premature for age atrophy. Hypoattenuation of the white matter, consistent with small vessel disease. Vascular: Reported separately. Skull: No skull fracture. Sinuses: Imaged portions are clear. Orbits: No acute finding. Review of the MIP images confirms the above findings CTA NECK FINDINGS Aortic arch: Standard branching. Imaged portion shows no evidence of aneurysm or dissection. No significant stenosis of the major arch vessel origins. Minor calcific atheromatous change of the transverse arch and great vessel origins Right carotid system: No evidence of dissection, stenosis (50% or greater) or occlusion. Left carotid system: No evidence of dissection, stenosis (50% or greater) or occlusion. Minor nonstenotic plaque. Vertebral arteries: BILATERAL patent. RIGHT slightly larger. No ostial narrowing. Skeleton: Spondylosis. Disc space narrowing. No cervical spine fracture. Poor dentition. Other neck: No acute or significant finding. Upper chest: BILATERAL pleural effusions. Subsegmental atelectasis. No pneumothorax. Median sternotomy. Review of the MIP images confirms the above findings CTA HEAD FINDINGS  Anterior circulation: No significant stenosis, proximal occlusion, aneurysm, or vascular malformation. Posterior circulation: Mild hypoplasia of basilar, likely related to fetal PCA. Dominant RIGHT vertebral. No significant stenosis, occlusion, aneurysm or vascular malformation. Venous sinuses: As permitted by contrast timing, patent. Anatomic variants: Fetal RIGHT PCA. Delayed phase: No abnormal intracranial enhancement. Review of the MIP images confirms the above findings IMPRESSION: 1. No extracranial or intracranial flow reducing lesion or dissection. 2. Multiple remote BILATERAL cerebral and cerebellar infarcts were acute  in 2014. No acute intracranial findings. 3. No posttraumatic sequelae are evident. No skull fracture or intracranial hemorrhage. Electronically Signed   By: Staci Righter M.D.   On: 03/29/2018 15:52    Procedures Procedures (including critical care time)  Medications Ordered in ED Medications  iopamidol (ISOVUE-370) 76 % injection (has no administration in time range)  methocarbamol (ROBAXIN) tablet 500 mg (500 mg Oral Given 03/29/18 1321)  HYDROcodone-acetaminophen (NORCO/VICODIN) 5-325 MG per tablet 1 tablet (1 tablet Oral Given 03/29/18 1321)  iopamidol (ISOVUE-370) 76 % injection 50 mL (50 mLs Intravenous Contrast Given 03/29/18 1509)     Initial Impression / Assessment and Plan / ED Course  I have reviewed the triage vital signs and the nursing notes.  Pertinent labs & imaging results that were available during my care of the patient were reviewed by me and considered in my medical decision making (see chart for details).    Pt with extensive cardiac history along with current smoking since to the ED after a post syncopal episode while helping a friend move some furniture.  He reports sudden sharp pain on his right lower back, worse with movement and changing positions.  Reports some weakness on his right leg and unable to weight-bear after the incident. However,neurp  exam is reassuring. During evaluation patient is well-appearing, texting on his cell phone. Patient had an MRI of his brain about a month ago which showed tubal infratentorial ischemic infractions, this was done due to him having a deficit in visual field.  They did recommend a CTA at that time to rule out any worsening.  Due to patient's syncopal episode today will obtain a CTA to rule out any other occlusions along with acute hemorrhage.  Patient provided with robaxin and hydrocodone for his back spasm, will reassess patient after CTA.  CTA Angio Head and Neck showed: 1. No extracranial or intracranial flow reducing lesion or  dissection.  2. Multiple remote BILATERAL cerebral and cerebellar infarcts were  acute in 2014. No acute intracranial findings.  3. No posttraumatic sequelae are evident. No skull fracture or  intracranial hemorrhage.   Patient reports relieve after robaxin and hydrocodone will send him home with an Rx muscle relaxers and NSAIDS. Some suspicion for muscle strain as patient reports tenderness with palpation and worse with movement relieved by robaxin.He is advised to follow up with PCP as needed. Patient educated on smoking cessation. Currently eating a big Mac from [employer]. Vitals stable for discharge, patient stable for discharge.    Final Clinical Impressions(s) / ED Diagnoses   Final diagnoses:  Syncope and collapse  Strain of lumbar region, initial encounter    ED Discharge Orders         Ordered    methocarbamol (ROBAXIN) 500 MG tablet  2 times daily     03/29/18 1615    naproxen (NAPROSYN) 500 MG tablet  2 times daily     03/29/18 1615           Janeece Fitting, PA-C 03/29/18 1619    Noemi Chapel, MD 03/29/18 [phone]

## 2018-03-29 NOTE — ED Triage Notes (Addendum)
Pt BIB [facility] for eval of back pain and syncope. Pt was moving a couch, pulled the couch and felt a muscle pull in his R lower back. Pt noted immediate pain and fell backwards losing consciousness for about 30 seconds and striking head. EMS unable to obtain BP or peripheral pulses on their arrival. Pt was diaphoretic. Pt takes daily ASA w/ significant cardiac hx. Denies CP or SOB.

## 2018-03-29 NOTE — ED Notes (Signed)
Help get patient undress on the monitor patient is resting with call bell in reach 

## 2018-03-29 NOTE — ED Notes (Signed)
Patient verbalizes understanding of discharge instructions. Opportunity for questioning and answers were provided. Armband removed by staff, pt discharged from ED ambulatory w/ brother

## 2018-03-29 NOTE — Discharge Instructions (Addendum)
I have prescribed muscle relaxers for your pain, please do not drink or drive while taking this medications as they can make you drowsy.  If experience any bowel or bladder incontinence, fever, worsening in your symptoms please return to the ED.

## 2018-04-01 ENCOUNTER — Ambulatory Visit: Payer: Medicare HMO | Admitting: Cardiology

## 2018-04-24 ENCOUNTER — Telehealth: Payer: Self-pay | Admitting: Internal Medicine

## 2018-04-24 DIAGNOSIS — R7303 Prediabetes: Secondary | ICD-10-CM

## 2018-04-24 DIAGNOSIS — E669 Obesity, unspecified: Secondary | ICD-10-CM

## 2018-04-24 NOTE — Telephone Encounter (Signed)
Patient called requesting to speak with PCP because he was diagnosed with pre- DM and patient saw in the Internet that is he takes apple cider vinegar, lemon, honey, and cinnamon with tea or water it would help bring blood sugars down. Patient would like to know if this would be safe to take due to his health condition. Please advise.

## 2018-04-24 NOTE — Telephone Encounter (Signed)
Will forward to pcp

## 2018-04-25 NOTE — Telephone Encounter (Signed)
Contacted pt to go over Dr. Wynetta Emery message pt is aware and pt states he would like the referral to nutritionist

## 2018-05-01 ENCOUNTER — Ambulatory Visit: Payer: Medicare HMO | Admitting: Internal Medicine

## 2018-05-13 ENCOUNTER — Ambulatory Visit: Payer: Self-pay | Admitting: Registered"

## 2018-05-27 ENCOUNTER — Emergency Department (HOSPITAL_COMMUNITY)
Admission: EM | Admit: 2018-05-27 | Discharge: 2018-05-27 | Disposition: A | Discharge status: Home / Self Care | Payer: Medicare HMO | Attending: Emergency Medicine | Admitting: Emergency Medicine

## 2018-05-27 ENCOUNTER — Encounter (HOSPITAL_COMMUNITY): Payer: Self-pay | Admitting: Emergency Medicine

## 2018-05-27 DIAGNOSIS — F17210 Nicotine dependence, cigarettes, uncomplicated: Secondary | ICD-10-CM | POA: Diagnosis not present

## 2018-05-27 DIAGNOSIS — I10 Essential (primary) hypertension: Secondary | ICD-10-CM | POA: Diagnosis not present

## 2018-05-27 DIAGNOSIS — Z951 Presence of aortocoronary bypass graft: Secondary | ICD-10-CM | POA: Diagnosis not present

## 2018-05-27 DIAGNOSIS — I2510 Atherosclerotic heart disease of native coronary artery without angina pectoris: Secondary | ICD-10-CM | POA: Insufficient documentation

## 2018-05-27 DIAGNOSIS — Z7982 Long term (current) use of aspirin: Secondary | ICD-10-CM | POA: Insufficient documentation

## 2018-05-27 DIAGNOSIS — Z955 Presence of coronary angioplasty implant and graft: Secondary | ICD-10-CM | POA: Insufficient documentation

## 2018-05-27 DIAGNOSIS — Z79899 Other long term (current) drug therapy: Secondary | ICD-10-CM | POA: Diagnosis not present

## 2018-05-27 DIAGNOSIS — M5441 Lumbago with sciatica, right side: Secondary | ICD-10-CM | POA: Diagnosis not present

## 2018-05-27 DIAGNOSIS — M545 Low back pain: Secondary | ICD-10-CM | POA: Diagnosis present

## 2018-05-27 DIAGNOSIS — R69 Illness, unspecified: Secondary | ICD-10-CM | POA: Diagnosis not present

## 2018-05-27 DIAGNOSIS — M5431 Sciatica, right side: Secondary | ICD-10-CM

## 2018-05-27 MED ORDER — DEXAMETHASONE SODIUM PHOSPHATE 10 MG/ML IJ SOLN
10.0000 mg | Freq: Once | INTRAMUSCULAR | Status: AC
  Administered 2018-05-27: 10 mg via INTRAMUSCULAR
  Filled 2018-05-27: qty 1

## 2018-05-27 MED ORDER — HYDROCODONE-ACETAMINOPHEN 5-325 MG PO TABS: 1.0000 | ORAL_TABLET | ORAL | 0 refills | Status: DC | PRN

## 2018-05-27 MED ORDER — HYDROMORPHONE HCL 1 MG/ML IJ SOLN
1.0000 mg | Freq: Once | INTRAMUSCULAR | Status: AC
  Administered 2018-05-27: 1 mg via INTRAMUSCULAR
  Filled 2018-05-27: qty 1

## 2018-05-27 NOTE — ED Provider Notes (Signed)
[facility] EMERGENCY DEPARTMENT Provider Note   CSN: [id number] Arrival date & time: 05/27/18  2992     History   Chief Complaint Chief Complaint  Patient presents with  . Leg Pain  . Back Pain    HPI [name] is a 58 y.o. male.  Patient is a 58 year old male who presents with back pain.  He states about a month ago he was moving some furniture and pulled a muscle in his right lower back.  He states it hurt for a few days but then it went away.  He states he woke up 3 days ago with similar pain in his right lower back but now it shoots down his right leg into the thigh and he has some numbness in his right thigh.  He says the numbness is only there when he stands up but goes away when he lies down.  He denies any weakness to the leg although it hurts when he moves his leg.  He denies any loss of bowel or bladder control.  No fevers.  No associate abdominal pain.  No history of back problems in the past.  He has been using some pain medications and muscle relaxers that were left over from his ED visit a month ago with no significant improvement in symptoms.     Past Medical History:  Diagnosis Date  . Abnormal EKG    hx of ischemia showing up on ekg's  . Acute myopericarditis    a. 03/2013: readm with hypoxia, tachycardia, elevated ESR/CRP, elevated troponin with 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and myopericarditis; treated with steroids.  . Acute respiratory failure ([place])    a. 01/2013: VDRF. b. 03/2013: hypoxia requiring supp O2 during adm, resolved by discharge.  . C. difficile colitis    a. 01/2013 during prolonged adm. b. Recurred 03/2013.  . Cardiac tamponade    a. 01/2013 s/p drain.  . Coronary artery disease    a. s/p MI in 2009 in [place] with stenting of the LCX and LAD;  b. 12/2012 NSTEMI/CAD: LM nl, LAD patent prox stent, LCX 50-70 isr (FFR 0.84), RCA dom, 15m EF 40-45%-->Med Rx. c. 01/2013: anterolateral STEMI complicated by pericardial effusion (presumed  purulent pericarditis) and tamponade s/p drain, ruptured LV pseudoaneurysm s/p CorMatrix patch, CABGx1 (SVG-OM1), fever, CVA, VDRF, C diff.  . CVA (cerebral infarction)    a. 01/2013 in setting of prolonged hospitalization - residual L arm weakness.  . 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    a. 03/2013: readm with hypoxia, tachycardia, elevated ESR/CRP, elevated troponin with 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and myopericarditis; treated with steroids.  . Hyperlipidemia   . Hypokalemia   . Hyponatremia    a. During late 2014.  . Ischemic cardiomyopathy    a. Sept/Oct 2014: EF ~40% (ICM). b. 03/2013: EF 25-30%. Off ACEI due to hypotension (MIXED NICM/ICM).  .[place]  . Pericardial effusion    a. 01/2013: pericardial effusion (presumed purulent pericarditis) and cardiac tamponade s/p drain. b. Persistent moderate pericardial effusion 03/2013.  . Pseudoaneurysm of left ventricle of heart    a. 01/2013:  Ruptured inferoposterior LV pseudoaneurysm s/p CorMatrix patch.  . Sinus bradycardia    asymptomatic  . Stroke ([facility] 2009   weak lt side-lt arm  . Tobacco abuse    a. quit 12/2012.  . Wears dentures    top    Patient Active Problem List   Diagnosis Date Noted  . Prediabetes 01/01/2018  . Hyperlipidemia 12/30/2017  . Obesity (BMI 30.0-34.9)  12/30/2017  . Benign neoplasm of descending colon   . Essential hypertension 11/03/2015  . Cardiomyopathy, ischemic 06/03/2015  . Chronic systolic dysfunction of left ventricle 06/03/2015  . Erectile dysfunction due to arterial insufficiency 12/23/2014  . Lipoma of skin and subcutaneous tissue of neck 05/24/2014  . History of cardioembolic stroke 76/16/0737  . Vision loss of right eye 06/23/2013  . Dyslipidemia 06/22/2013  . 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 Coronary artery disease   . Cardiomyopathy ([place])   . Pseudoaneurysm of left ventricle of heart   . Tobacco abuse     Past Surgical History:  Procedure Laterality Date  . CARDIAC  CATHETERIZATION  01/06/2013  . COLONOSCOPY WITH PROPOFOL N/A 07/03/2016   Procedure: COLONOSCOPY WITH PROPOFOL;  Surgeon: Manus Gunning, MD;  Location: [facility];  Service: Gastroenterology;  Laterality: N/A;  . CORONARY ANGIOPLASTY WITH STENT PLACEMENT  2000's X 2   "1 + 1" (01/06/2013)  . CORONARY ARTERY BYPASS GRAFT N/A 01/28/2013   Procedure: CORONARY ARTERY BYPASS GRAFTING (CABG);  Surgeon: Melrose Nakayama, MD;  Location: [facility];  Service: Open Heart Surgery;  Laterality: N/A;  CABG x one, using left greater saphenous vein harvested endoscopically  . INTRAOPERATIVE TRANSESOPHAGEAL ECHOCARDIOGRAM N/A 01/28/2013   Procedure: INTRAOPERATIVE TRANSESOPHAGEAL ECHOCARDIOGRAM;  Surgeon: Melrose Nakayama, MD;  Location: [facility];  Service: Open Heart Surgery;  Laterality: N/A;  . LEFT HEART CATHETERIZATION WITH CORONARY ANGIOGRAM N/A 01/06/2013   Procedure: LEFT HEART CATHETERIZATION WITH CORONARY ANGIOGRAM;  Surgeon: Peter M Martinique, MD;  Location: [facility] CATH LAB;  Service: Cardiovascular;  Laterality: N/A;  . LEFT HEART CATHETERIZATION WITH CORONARY ANGIOGRAM N/A 01/21/2013   Procedure: LEFT HEART CATHETERIZATION WITH CORONARY ANGIOGRAM;  Surgeon: Burnell Blanks, MD;  Location: [facility] CATH LAB;  Service: Cardiovascular;  Laterality: N/A;  . MASS EXCISION Right 07/21/2014   Procedure: EXCISION RIGHT NECK MASS;  Surgeon: Erroll Luna, MD;  Location: [facility];  Service: General;  Laterality: Right;  . PERICARDIAL TAP N/A 01/24/2013   Procedure: PERICARDIAL TAP;  Surgeon: Blane Ohara, MD;  Location: [facility] CATH LAB;  Service: Cardiovascular;  Laterality: N/A;  . RIGHT HEART CATHETERIZATION Right 01/24/2013   Procedure: RIGHT HEART CATH;  Surgeon: Blane Ohara, MD;  Location: [facility] CATH LAB;  Service: Cardiovascular;  Laterality: Right;  . VENTRICULAR ANEURYSM RESECTION N/A 01/28/2013   Procedure: LEFT VENTRICULAR ANEURYSM REPAIR;  Surgeon: Melrose Nakayama, MD;   Location: [facility];  Service: Open Heart Surgery;  Laterality: N/A;        Home Medications    Prior to Admission medications   Medication Sig Start Date End Date Taking? Authorizing Provider  aspirin 81 MG EC tablet Take 1 tablet (81 mg total) by mouth daily. 06/22/13  Yes Tresa Garter, MD  atorvastatin (LIPITOR) 80 MG tablet Take 1 tablet (80 mg total) by mouth daily. 12/30/17  Yes Ladell Pier, MD  carvedilol (COREG) 25 MG tablet Take 1 tablet (25 mg total) by mouth 2 (two) times daily with a meal. 06/07/17  Yes Martinique, Peter M, MD  Cholecalciferol (VITAMIN D) 2000 units tablet Take 2,000 Units by mouth daily.   Yes [provider]  latanoprost (XALATAN) 0.005 % ophthalmic solution Place 1 drop into both eyes daily. 05/31/16  Yes [provider]  lisinopril (PRINIVIL,ZESTRIL) 10 MG tablet Take 1 tablet (10 mg total) by mouth 2 (two) times daily. 12/30/17  Yes Ladell Pier, MD  Multiple Vitamins-Minerals (CENTRUM ADULTS PO) Take  1 tablet by mouth daily.    Yes [provider]  nitroGLYCERIN (NITROSTAT) 0.4 MG SL tablet Place 1 tablet (0.4 mg total) under the tongue every 5 (five) minutes as needed for chest pain (up to 3 doses). 04/17/13  Yes Darlin Coco, MD  sildenafil (VIAGRA) 100 MG tablet Take 1 tablet (100 mg total) by mouth daily as needed for erectile dysfunction. 02/06/17  Yes Martinique, Peter M, MD  HYDROcodone-acetaminophen (NORCO/VICODIN) 5-325 MG tablet Take 1-2 tablets by mouth every 4 (four) hours as needed. 05/27/18   Malvin Johns, MD    Family History Family History  Problem Relation Age of Onset  . Heart attack Mother   . Diabetes Mother   . Hypertension Mother   . Hypertension Father   . CAD Other   . HIV Brother   . Stroke Neg Hx     Social History Social History   Tobacco Use  . Smoking status: Current Every Day Smoker    Years: 35.00    Types: Cigarettes  . Smokeless tobacco: Never Used  . Tobacco comment: smokes  10-12 cigarettes per week  Substance Use Topics  . Alcohol use: No    Alcohol/week: 0.0 standard drinks  . Drug use: No     Allergies   Patient has no known allergies.   Review of Systems Review of Systems  Constitutional: Negative for fever.  Gastrointestinal: Negative for abdominal pain, nausea and vomiting.  Musculoskeletal: Positive for back pain. Negative for arthralgias, joint swelling and neck pain.  Skin: Negative for wound.  Neurological: Positive for numbness. Negative for weakness and headaches.     Physical Exam Updated Vital Signs BP 114/83 (BP Location: Right Arm)   Pulse 65   Temp 98.8 F (37.1 C) (Oral)   Resp 20   SpO2 97%   Physical Exam Constitutional:      Appearance: He is well-developed.  HENT:     Head: Normocephalic and atraumatic.  Neck:     Musculoskeletal: Normal range of motion and neck supple.  Cardiovascular:     Rate and Rhythm: Normal rate.  Pulmonary:     Effort: Pulmonary effort is normal.  Musculoskeletal:        General: Tenderness present.     Comments: Patient has tenderness to his right lower back in the lumbar region.  There is no spinal tenderness.  Positive straight leg raise on the right.  He has a little bit of a diminished reflex to the right patella.  He has normal sensation in both lower extremities to light touch.  Normal motor function in both lower extremities.  Pedal pulses are symmetric and intact.  Skin:    General: Skin is warm and dry.  Neurological:     Mental Status: He is alert and oriented to person, place, and time.      ED Treatments / Results  Labs (all labs ordered are listed, but only abnormal results are displayed) Labs Reviewed - No data to display  EKG None  Radiology No results found.  Procedures Procedures (including critical care time)  Medications Ordered in ED Medications  dexamethasone (DECADRON) injection 10 mg (10 mg Intramuscular Given 05/27/18 0935)  HYDROmorphone  (DILAUDID) injection 1 mg (1 mg Intramuscular Given 05/27/18 0935)     Initial Impression / Assessment and Plan / ED Course  I have reviewed the triage vital signs and the nursing notes.  Pertinent labs & imaging results that were available during my care of the patient were  reviewed by me and considered in my medical decision making (see chart for details).     Patient is a 58 year old male who presents with radicular right-sided back pain.  He has no neurologic deficits currently.  He does report some intermittent numbness.  No associate abdominal pain which would be more concerning for an abdominal etiology.  No urinary symptoms or fevers.  No suggestions of cauda equina.  His pain is controlled after treatment in the ED.  He was also given a shot of Decadron.  He was discharged home in good condition.  He was given a prescription for a short course of Vicodin.  He already has some Robaxin and Naprosyn to use at home.  He was encouraged to make a follow-up appointment with his PCP.  Return precautions were given.  Final Clinical Impressions(s) / ED Diagnoses   Final diagnoses:  Sciatica of right side    ED Discharge Orders         Ordered    HYDROcodone-acetaminophen (NORCO/VICODIN) 5-325 MG tablet  Every 4 hours PRN     05/27/18 1029           Malvin Johns, MD 05/27/18 1031

## 2018-05-27 NOTE — ED Triage Notes (Signed)
Pt states a month ago he pulled a muscle in his back and took medications with relief. States on Saturday he missed a step and landed hard, has right lower back pain and when he stands the pain shoots down the right leg. No incontinence problems.

## 2018-05-28 ENCOUNTER — Emergency Department (HOSPITAL_COMMUNITY)
Admission: EM | Admit: 2018-05-28 | Discharge: 2018-05-29 | Disposition: A | Discharge status: Home / Self Care | Payer: Medicare HMO | Attending: Emergency Medicine | Admitting: Emergency Medicine

## 2018-05-28 ENCOUNTER — Telehealth: Payer: Self-pay | Admitting: Internal Medicine

## 2018-05-28 DIAGNOSIS — Z7982 Long term (current) use of aspirin: Secondary | ICD-10-CM | POA: Diagnosis not present

## 2018-05-28 DIAGNOSIS — Z79899 Other long term (current) drug therapy: Secondary | ICD-10-CM | POA: Insufficient documentation

## 2018-05-28 DIAGNOSIS — R69 Illness, unspecified: Secondary | ICD-10-CM | POA: Diagnosis not present

## 2018-05-28 DIAGNOSIS — M5431 Sciatica, right side: Secondary | ICD-10-CM

## 2018-05-28 DIAGNOSIS — R109 Unspecified abdominal pain: Secondary | ICD-10-CM | POA: Insufficient documentation

## 2018-05-28 DIAGNOSIS — M5441 Lumbago with sciatica, right side: Secondary | ICD-10-CM | POA: Diagnosis not present

## 2018-05-28 DIAGNOSIS — I5022 Chronic systolic (congestive) heart failure: Secondary | ICD-10-CM | POA: Diagnosis not present

## 2018-05-28 DIAGNOSIS — M5127 Other intervertebral disc displacement, lumbosacral region: Secondary | ICD-10-CM | POA: Diagnosis not present

## 2018-05-28 DIAGNOSIS — I110 Hypertensive heart disease with heart failure: Secondary | ICD-10-CM | POA: Insufficient documentation

## 2018-05-28 DIAGNOSIS — M5416 Radiculopathy, lumbar region: Secondary | ICD-10-CM

## 2018-05-28 DIAGNOSIS — F17210 Nicotine dependence, cigarettes, uncomplicated: Secondary | ICD-10-CM | POA: Diagnosis not present

## 2018-05-28 DIAGNOSIS — M549 Dorsalgia, unspecified: Secondary | ICD-10-CM | POA: Diagnosis present

## 2018-05-28 NOTE — ED Triage Notes (Signed)
Pt reports back pain on/off X1 mo. Was seen yesterday for same and given medication for pain which did help. However this evening pt tried to carry in groceries which exacerbated the pain again.

## 2018-05-28 NOTE — Telephone Encounter (Signed)
New Message   Pt states [facility] and requesting that Dr. Wynetta Emery call to schedule an MRI for the pt's back. Pleae f/u

## 2018-05-29 ENCOUNTER — Emergency Department (HOSPITAL_COMMUNITY): Payer: Medicare HMO

## 2018-05-29 ENCOUNTER — Encounter (HOSPITAL_COMMUNITY): Payer: Self-pay | Admitting: Radiology

## 2018-05-29 DIAGNOSIS — I110 Hypertensive heart disease with heart failure: Secondary | ICD-10-CM | POA: Diagnosis not present

## 2018-05-29 DIAGNOSIS — R69 Illness, unspecified: Secondary | ICD-10-CM | POA: Diagnosis not present

## 2018-05-29 DIAGNOSIS — R109 Unspecified abdominal pain: Secondary | ICD-10-CM | POA: Diagnosis not present

## 2018-05-29 DIAGNOSIS — Z79899 Other long term (current) drug therapy: Secondary | ICD-10-CM | POA: Diagnosis not present

## 2018-05-29 DIAGNOSIS — M5127 Other intervertebral disc displacement, lumbosacral region: Secondary | ICD-10-CM | POA: Diagnosis not present

## 2018-05-29 DIAGNOSIS — M5431 Sciatica, right side: Secondary | ICD-10-CM | POA: Diagnosis not present

## 2018-05-29 DIAGNOSIS — Z7982 Long term (current) use of aspirin: Secondary | ICD-10-CM | POA: Diagnosis not present

## 2018-05-29 DIAGNOSIS — I5022 Chronic systolic (congestive) heart failure: Secondary | ICD-10-CM | POA: Diagnosis not present

## 2018-05-29 MED ORDER — HYDROMORPHONE HCL 1 MG/ML IJ SOLN
1.0000 mg | Freq: Once | INTRAMUSCULAR | Status: AC
  Administered 2018-05-29: 1 mg via INTRAMUSCULAR
  Filled 2018-05-29: qty 1

## 2018-05-29 MED ORDER — METHYLPREDNISOLONE 4 MG PO TBPK: ORAL_TABLET | ORAL | 0 refills | Status: DC

## 2018-05-29 MED ORDER — DIAZEPAM 5 MG PO TABS
5.0000 mg | ORAL_TABLET | Freq: Once | ORAL | Status: AC
  Administered 2018-05-29: 5 mg via ORAL
  Filled 2018-05-29: qty 1

## 2018-05-29 MED ORDER — KETOROLAC TROMETHAMINE 30 MG/ML IJ SOLN
30.0000 mg | Freq: Once | INTRAMUSCULAR | Status: AC
  Administered 2018-05-29: 30 mg via INTRAMUSCULAR
  Filled 2018-05-29: qty 1

## 2018-05-29 NOTE — ED Notes (Signed)
Pt will be pushed back one for MRI, dr.arora wants a pt now for MRI

## 2018-05-29 NOTE — ED Notes (Signed)
Patient transported to MRI 

## 2018-05-29 NOTE — ED Notes (Addendum)
Pt ambulated with [place] for about 15 feet ;no assistance needed

## 2018-05-29 NOTE — Discharge Instructions (Signed)
Take the steroids and pain medication as prescribed and follow-up with your neurosurgeon.  Return to the ED with worsening pain weakness, numbness, incontinence or other concerns.

## 2018-05-29 NOTE — ED Provider Notes (Signed)
[facility] EMERGENCY DEPARTMENT Provider Note   CSN: [id number] Arrival date & time: 05/28/18  2233     History   Chief Complaint Chief Complaint  Patient presents with  . Back Pain    HPI [name] is a 58 y.o. male.  Patient presents with right-sided back pain that radiates down his right leg.  He was seen yesterday for the same symptoms.  He states he felt better when he left but had pain again tonight after trying to carrying the groceries with his wife.  Reports having ongoing right-sided back pain for the past 5 days but denies any specific injury.  He is supposed to follow-up with his doctor for an MRI of his back but has not had one yet.  Reports pain in his right lower back that radiates down his right leg associated with numbness and tingling that is worse when he stands up.  There is been no bowel or bladder incontinence.  No fever, chills, nausea or vomiting.  No history of allergy or drug abuse or cancer.  He states there is no numbness in his leg and except when he tries to stand up he feels some tingling in his anterior thigh and shin.  There is significant pain but no weakness in the leg.  No abdominal pain or chest pain.  He took Vicodin at home today without relief.  The history is provided by the patient.  Back Pain  Associated symptoms: numbness   Associated symptoms: no abdominal pain, no chest pain, no dysuria, no fever, no headaches and no weakness     Past Medical History:  Diagnosis Date  . Abnormal EKG    hx of ischemia showing up on ekg's  . Acute myopericarditis    a. 03/2013: readm with hypoxia, tachycardia, elevated ESR/CRP, elevated troponin with 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and myopericarditis; treated with steroids.  . Acute respiratory failure ([place])    a. 01/2013: VDRF. b. 03/2013: hypoxia requiring supp O2 during adm, resolved by discharge.  . C. difficile colitis    a. 01/2013 during prolonged adm. b. Recurred 03/2013.  .  Cardiac tamponade    a. 01/2013 s/p drain.  . Coronary artery disease    a. s/p MI in 2009 in [place] with stenting of the LCX and LAD;  b. 12/2012 NSTEMI/CAD: LM nl, LAD patent prox stent, LCX 50-70 isr (FFR 0.84), RCA dom, 130m EF 40-45%-->Med Rx. c. 01/2013: anterolateral STEMI complicated by pericardial effusion (presumed purulent pericarditis) and tamponade s/p drain, ruptured LV pseudoaneurysm s/p CorMatrix patch, CABGx1 (SVG-OM1), fever, CVA, VDRF, C diff.  . CVA (cerebral infarction)    a. 01/2013 in setting of prolonged hospitalization - residual L arm weakness.  . 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([place]    a. 03/2013: readm with hypoxia, tachycardia, elevated ESR/CRP, elevated troponin with 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and myopericarditis; treated with steroids.  . Hyperlipidemia   . Hypokalemia   . Hyponatremia    a. During late 2014.  . Ischemic cardiomyopathy    a. Sept/Oct 2014: EF ~40% (ICM). b. 03/2013: EF 25-30%. Off ACEI due to hypotension (MIXED NICM/ICM).  .[place] Kitchen[place]ocardial infarction ([place]  . Pericardial effusion    a. 01/2013: pericardial effusion (presumed purulent pericarditis) and cardiac tamponade s/p drain. b. Persistent moderate pericardial effusion 03/2013.  . Pseudoaneurysm of left ventricle of heart    a. 01/2013:  Ruptured inferoposterior LV pseudoaneurysm s/p CorMatrix patch.  . Sinus bradycardia    asymptomatic  . Stroke ([facility] 2009  weak lt side-lt arm  . Tobacco abuse    a. quit 12/2012.  . Wears dentures    top    Patient Active Problem List   Diagnosis Date Noted  . Prediabetes 01/01/2018  . Hyperlipidemia 12/30/2017  . Obesity (BMI 30.0-34.9) 12/30/2017  . Benign neoplasm of descending colon   . Essential hypertension 11/03/2015  . Cardiomyopathy, ischemic 06/03/2015  . Chronic systolic dysfunction of left ventricle 06/03/2015  . Erectile dysfunction due to arterial insufficiency 12/23/2014  . Lipoma of skin and subcutaneous tissue of neck 05/24/2014  . History of  cardioembolic stroke 67/67/2094  . Vision loss of right eye 06/23/2013  . Dyslipidemia 06/22/2013  . 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([place])   . Coronary artery disease   . Cardiomyopathy ([place])   . Pseudoaneurysm of left ventricle of heart   . Tobacco abuse     Past Surgical History:  Procedure Laterality Date  . CARDIAC CATHETERIZATION  01/06/2013  . COLONOSCOPY WITH PROPOFOL N/A 07/03/2016   Procedure: COLONOSCOPY WITH PROPOFOL;  Surgeon: Manus Gunning, MD;  Location: [facility];  Service: Gastroenterology;  Laterality: N/A;  . CORONARY ANGIOPLASTY WITH STENT PLACEMENT  2000's X 2   "1 + 1" (01/06/2013)  . CORONARY ARTERY BYPASS GRAFT N/A 01/28/2013   Procedure: CORONARY ARTERY BYPASS GRAFTING (CABG);  Surgeon: Melrose Nakayama, MD;  Location: [facility];  Service: Open Heart Surgery;  Laterality: N/A;  CABG x one, using left greater saphenous vein harvested endoscopically  . INTRAOPERATIVE TRANSESOPHAGEAL ECHOCARDIOGRAM N/A 01/28/2013   Procedure: INTRAOPERATIVE TRANSESOPHAGEAL ECHOCARDIOGRAM;  Surgeon: Melrose Nakayama, MD;  Location: [facility];  Service: Open Heart Surgery;  Laterality: N/A;  . LEFT HEART CATHETERIZATION WITH CORONARY ANGIOGRAM N/A 01/06/2013   Procedure: LEFT HEART CATHETERIZATION WITH CORONARY ANGIOGRAM;  Surgeon: Peter M Martinique, MD;  Location: [facility] CATH LAB;  Service: Cardiovascular;  Laterality: N/A;  . LEFT HEART CATHETERIZATION WITH CORONARY ANGIOGRAM N/A 01/21/2013   Procedure: LEFT HEART CATHETERIZATION WITH CORONARY ANGIOGRAM;  Surgeon: Burnell Blanks, MD;  Location: [facility] CATH LAB;  Service: Cardiovascular;  Laterality: N/A;  . MASS EXCISION Right 07/21/2014   Procedure: EXCISION RIGHT NECK MASS;  Surgeon: Erroll Luna, MD;  Location: [facility];  Service: General;  Laterality: Right;  . PERICARDIAL TAP N/A 01/24/2013   Procedure: PERICARDIAL TAP;  Surgeon: Blane Ohara, MD;  Location: [facility] CATH LAB;  Service: Cardiovascular;  Laterality: N/A;  .  RIGHT HEART CATHETERIZATION Right 01/24/2013   Procedure: RIGHT HEART CATH;  Surgeon: Blane Ohara, MD;  Location: [facility] CATH LAB;  Service: Cardiovascular;  Laterality: Right;  . VENTRICULAR ANEURYSM RESECTION N/A 01/28/2013   Procedure: LEFT VENTRICULAR ANEURYSM REPAIR;  Surgeon: Melrose Nakayama, MD;  Location: [facility];  Service: Open Heart Surgery;  Laterality: N/A;        Home Medications    Prior to Admission medications   Medication Sig Start Date End Date Taking? Authorizing Provider  aspirin 81 MG EC tablet Take 1 tablet (81 mg total) by mouth daily. 06/22/13   Tresa Garter, MD  atorvastatin (LIPITOR) 80 MG tablet Take 1 tablet (80 mg total) by mouth daily. 12/30/17   Ladell Pier, MD  carvedilol (COREG) 25 MG tablet Take 1 tablet (25 mg total) by mouth 2 (two) times daily with a meal. 06/07/17   Martinique, Peter M, MD  Cholecalciferol (VITAMIN D) 2000 units tablet Take 2,000 Units by mouth daily.    [provider]  HYDROcodone-acetaminophen (NORCO/VICODIN) 5-325 MG  tablet Take 1-2 tablets by mouth every 4 (four) hours as needed. 05/27/18   Malvin Johns, MD  latanoprost (XALATAN) 0.005 % ophthalmic solution Place 1 drop into both eyes daily. 05/31/16   [provider]  lisinopril (PRINIVIL,ZESTRIL) 10 MG tablet Take 1 tablet (10 mg total) by mouth 2 (two) times daily. 12/30/17   Ladell Pier, MD  Multiple Vitamins-Minerals (CENTRUM ADULTS PO) Take 1 tablet by mouth daily.     [provider]  nitroGLYCERIN (NITROSTAT) 0.4 MG SL tablet Place 1 tablet (0.4 mg total) under the tongue every 5 (five) minutes as needed for chest pain (up to 3 doses). 04/17/13   Darlin Coco, MD  sildenafil (VIAGRA) 100 MG tablet Take 1 tablet (100 mg total) by mouth daily as needed for erectile dysfunction. 02/06/17   Martinique, Peter M, MD    Family History Family History  Problem Relation Age of Onset  . Heart attack Mother   . Diabetes Mother   .  Hypertension Mother   . Hypertension Father   . CAD Other   . HIV Brother   . Stroke Neg Hx     Social History Social History   Tobacco Use  . Smoking status: Current Every Day Smoker    Years: 35.00    Types: Cigarettes  . Smokeless tobacco: Never Used  . Tobacco comment: smokes 10-12 cigarettes per week  Substance Use Topics  . Alcohol use: No    Alcohol/week: 0.0 standard drinks  . Drug use: No     Allergies   Patient has no known allergies.   Review of Systems Review of Systems  Constitutional: Negative for activity change, appetite change and fever.  HENT: Negative for congestion and rhinorrhea.   Eyes: Negative for visual disturbance.  Respiratory: Negative for cough and shortness of breath.   Cardiovascular: Negative for chest pain.  Gastrointestinal: Negative for abdominal pain, nausea and vomiting.  Genitourinary: Negative for dysuria and hematuria.  Musculoskeletal: Positive for arthralgias, back pain and myalgias.  Skin: Negative for wound.  Neurological: Positive for numbness. Negative for dizziness, weakness and headaches.    all other systems are negative except as noted in the HPI and PMH.    Physical Exam Updated Vital Signs BP 106/74 (BP Location: Right Arm)   Pulse 67   Temp 98.3 F (36.8 C) (Oral)   Resp 16   SpO2 95%   Physical Exam Vitals signs and nursing note reviewed.  Constitutional:      General: He is not in acute distress.    Appearance: He is well-developed.  HENT:     Head: Normocephalic and atraumatic.     Mouth/Throat:     Pharynx: No oropharyngeal exudate.  Eyes:     Conjunctiva/sclera: Conjunctivae normal.     Pupils: Pupils are equal, round, and reactive to light.  Neck:     Musculoskeletal: Normal range of motion and neck supple.     Comments: No meningismus. Cardiovascular:     Rate and Rhythm: Normal rate and regular rhythm.     Heart sounds: Normal heart sounds. No murmur.  Pulmonary:     Effort: Pulmonary  effort is normal. No respiratory distress.     Breath sounds: Normal breath sounds.  Abdominal:     Palpations: Abdomen is soft.     Tenderness: There is no abdominal tenderness. There is no guarding or rebound.  Musculoskeletal: Normal range of motion.        General: Tenderness present.  Comments: Right paraspinal lumbar tenderness, no midline tenderness  4/5 strength in right lower extremity. 5/5 strength in Left lower extremity. Ankle plantar and dorsiflexion intact. Great toe extension intact bilaterally. +2 DP and PT pulses. +2 patellar reflexes bilaterally. antalgic gait.  Some weakness moving right leg but appears to have significant amount of pain.  Believe his weakness is likely secondary to pain  Skin:    General: Skin is warm.     Capillary Refill: Capillary refill takes less than 2 seconds.  Neurological:     General: No focal deficit present.     Mental Status: He is alert and oriented to person, place, and time. Mental status is at baseline.     Cranial Nerves: No cranial nerve deficit.     Motor: No abnormal muscle tone.     Coordination: Coordination normal.     Comments: No ataxia on finger to nose bilaterally. No pronator drift. 5/5 strength throughout. CN 2-12 intact.Equal grip strength. Sensation intact.   Psychiatric:        Behavior: Behavior normal.      ED Treatments / Results  Labs (all labs ordered are listed, but only abnormal results are displayed) Labs Reviewed - No data to display  EKG None  Radiology Mr Lumbar Spine Wo Contrast  Result Date: 05/29/2018 CLINICAL DATA:  Acute presentation with back pain beginning 1 month ago while lifting. Severe pain extends into the right leg with numbness. EXAM: MRI LUMBAR SPINE WITHOUT CONTRAST TECHNIQUE: Multiplanar, multisequence MR imaging of the lumbar spine was performed. No intravenous contrast was administered. COMPARISON:  Lumbar radiography 03/29/2018 FINDINGS: Segmentation:  5 lumbar type vertebral  bodies. Alignment:  Normal Vertebrae:  No fracture or primary bone lesion. Conus medullaris and cauda equina: Conus extends to the L1 level. Conus and cauda equina appear normal. Paraspinal and other soft tissues: Negative Disc levels: T12-L1: Normal. L1-2: Mild disc bulge.  No canal or foraminal stenosis. L2-3: Mild disc bulge.  No canal or foraminal stenosis. L3-4: Right extraforaminal disc herniation likely to compress the right L3 nerve. No central canal stenosis. Smaller left extraforaminal protrusion which could also affect the left L3 nerve. L4-5: Bilateral foraminal encroachment by osteophytes and protruding disc material, left worse than right. Mild facet and ligamentous hypertrophy. Potential for L4 nerve root compression, more so on the left than the right. L5-S1: Shallow central disc protrusion without apparent neural compression. Mild facet osteoarthritis. IMPRESSION: The acute and likely significant finding is at L3-4 where there is a right extraforaminal disc herniation likely to affect the right L3 nerve. There is a smaller left extraforaminal protrusion which would also have some potential to affect the left L3 nerve. L4-5: Bilateral foraminal stenosis because of encroachment by osteophyte in disc material left worse than right. Either L4 nerve could be affected, more likely the left. This has more of a chronic appearance. Electronically Signed   By: Nelson Chimes M.D.   On: 05/29/2018 07:25   Ct Renal Stone Study  Result Date: 05/29/2018 CLINICAL DATA:  Right flank pain EXAM: CT ABDOMEN AND PELVIS WITHOUT CONTRAST TECHNIQUE: Multidetector CT imaging of the abdomen and pelvis was performed following the standard protocol without IV contrast. COMPARISON:  None. FINDINGS: LOWER CHEST: There is no basilar pleural or apical pericardial effusion. HEPATOBILIARY: The hepatic contours and density are normal. There is no intra- or extrahepatic biliary dilatation. The gallbladder is normal. PANCREAS: The  pancreatic parenchymal contours are normal and there is no ductal dilatation. There is no  peripancreatic fluid collection. SPLEEN: Normal. ADRENALS/URINARY TRACT: --Adrenal glands: Normal. --Right kidney/ureter: No hydronephrosis, nephroureterolithiasis, perinephric stranding or solid renal mass. --Left kidney/ureter: No hydronephrosis, nephroureterolithiasis, perinephric stranding or solid renal mass. --Urinary bladder: Normal for degree of distention STOMACH/BOWEL: --Stomach/Duodenum: There is no hiatal hernia or other gastric abnormality. The duodenal course and caliber are normal. --Small bowel: No dilatation or inflammation. --Colon: No focal abnormality. --Appendix: Normal. VASCULAR/LYMPHATIC: There is aortic atherosclerosis without hemodynamically significant stenosis. No abdominal or pelvic lymphadenopathy. REPRODUCTIVE: Normal prostate size with symmetric seminal vesicles. MUSCULOSKELETAL. No bony spinal canal stenosis or focal osseous abnormality. OTHER: None. IMPRESSION: No obstructive uropathy or nephrolithiasis. No other acute abdominal abnormality. Electronically Signed   By: Ulyses Jarred M.D.   On: 05/29/2018 01:43    Procedures Procedures (including critical care time)  Medications Ordered in ED Medications  diazepam (VALIUM) tablet 5 mg (has no administration in time range)  ketorolac (TORADOL) 30 MG/ML injection 30 mg (has no administration in time range)     Initial Impression / Assessment and Plan / ED Course  I have reviewed the triage vital signs and the nursing notes.  Pertinent labs & imaging results that were available during my care of the patient were reviewed by me and considered in my medical decision making (see chart for details).    Right-sided low back pain that radiates down the right leg with some numbness.  No bowel or bladder incontinence.  Intact distal pulses.  Some weakness in the leg but significant pain On exam. Low suspicion for cord compression or cauda  equina.   CT obtained without evidence of AAA or ureteral stone.   Patient still with significant pain on exam but is able to ambulate.  MRI is obtained which shows a disc bulge at L3/L4.  Also has foraminal stenosis at L4/L5.  Patient will be started on Solu-Medrol and has Vicodin at home.  Discussed with Dr. Vertell Limber of neurosurgery who agrees with steroids and outpatient follow-up.  Patient is able to ambulate.  Return precautions discussed including weakness, numbness, tingling, bowel or bladder incontinence, fever or any concerns. Final Clinical Impressions(s) / ED Diagnoses   Final diagnoses:  Right sided sciatica    ED Discharge Orders    None       Augustino Savastano, Annie Main, MD 05/29/18 [phone]

## 2018-05-30 NOTE — Telephone Encounter (Signed)
Follow up   Pt is calling, states he had his MRI which showed he has a bulging disc and wants to know what steps should be taken now to correct it. Pt says he is in a lot of pain. Please f/u

## 2018-05-30 NOTE — Telephone Encounter (Signed)
Will forward to pcp

## 2018-06-02 NOTE — Telephone Encounter (Signed)
[name] could you schedule pt an appointment

## 2018-06-03 ENCOUNTER — Telehealth: Payer: Self-pay | Admitting: Internal Medicine

## 2018-06-03 NOTE — Telephone Encounter (Signed)
-----   Message from Ena Dawley sent at 06/02/2018  3:56 PM EST ----- Regarding: RE: Urgent referral to neurosurgent Noted    Sent Referral to [facility] ph. # [phone] .They will contact the patient to schedule an appointment. ----- Message ----- From: Ladell Pier, MD Sent: 05/30/2018   5:13 PM EST To: Ena Dawley Subject: Urgent referral to neurosurgent                Pt needs referral to spine specialist ASAP.  MRI report is in the system.

## 2018-06-03 NOTE — Telephone Encounter (Signed)
Patient will see dr swords 06/04/2018 at 2:30

## 2018-06-04 ENCOUNTER — Encounter: Payer: Self-pay | Admitting: Internal Medicine

## 2018-06-04 ENCOUNTER — Ambulatory Visit: Payer: Medicare HMO | Attending: Internal Medicine | Admitting: Internal Medicine

## 2018-06-04 DIAGNOSIS — M5126 Other intervertebral disc displacement, lumbar region: Secondary | ICD-10-CM | POA: Insufficient documentation

## 2018-06-04 MED ORDER — MELOXICAM 7.5 MG PO TABS: 7.5000 mg | ORAL_TABLET | Freq: Two times a day (BID) | ORAL | 0 refills | Status: AC

## 2018-06-04 NOTE — Patient Instructions (Signed)
You have been given meloxicam to take two times daily for 5 days. Take with food. Call us for increasing pain or any weakness in your leg. Also call for any bowel or bladder problems

## 2018-06-04 NOTE — Assessment & Plan Note (Signed)
Reviewed MRI scan.  Patient has an acute herniated disc.  No doubt causing radicular pain to his right leg.  He is not weak at this time.  He is uncomfortable.  I will treat with nonsteroidal anti-inflammatory drugs.  Side effects discussed.  He does not have any allergies.  He will follow-up with neurosurgery on Friday.  He knows to call our office for any bowel or bladder incontinence, bowel or bladder trouble.  Increasing pain.  Or weakness.

## 2018-06-04 NOTE — Progress Notes (Signed)
Pt states his pain from his back is going down his right leg   Pt states he pulled a muscle   Dr. Leanne Chang note: Patient presents with approximately 1 month history of back pain.  He recounts a history of intermittent low back pain but approximately 1 month ago he was helping his wife with some household duties.  He leaned over and had acute onset of low back pain.  Pain is more right-sided with pain extending to his right leg approximately to his knee.  He denies any weakness.  He denies any bowel or bladder incontinence.  He denies any significant trauma.  He denies any fevers or chills.  He states that his activities are limited by pain.  Past medical history: Patient has an extensive history of tobacco abuse, coronary artery disease, myocardial infarction, bypass surgery.  Has also had Dressler syndrome.  Reviewed his past medical history.  I reviewed his medications.  It is worth noting he is tried a course of steroids for his back pain.  (No relief).  Social history smoker  Review of systems as above he denies any chest pain, shortness of breath, PND, orthopnea.  His appetite remains good.  He denies any rashes, fevers chills.  Physical exam: BP 120/83   Pulse 61   Temp 98.2 F (36.8 C) (Oral)   Resp 16   Ht 6\' 1"  (1.854 m)   Wt 229 lb 6.4 oz (104.1 kg)   SpO2 96%   BMI 30.27 kg/m   well-developed well-nourished male in no acute distress. HEENT exam atraumatic, normocephalic, neck supple without jugular venous distention. Chest clear to auscultation cardiac exam S1-S2 are regular. Abdominal exam overweight with bowel sounds, soft and nontender. Extremities no edema. Neurologic exam is alert with a normal gait.  I am unable to elicit knee jerk or ankle jerk reflexes in either lower extremity.  He is able to walk on both heels.  Strength is decreased in his right leg secondary to pain.  Iliopsoas strength is normal.  Quadricep strength 4 out of 5, hamstring strength 4 out of  5.  Assessment plan Lumbar herniated disc Reviewed MRI scan.  Patient has an acute herniated disc.  No doubt causing radicular pain to his right leg.  He is not weak at this time.  He is uncomfortable.  I will treat with nonsteroidal anti-inflammatory drugs.  Side effects discussed.  He does not have any allergies.  He will follow-up with neurosurgery on Friday.  He knows to call our office for any bowel or bladder incontinence, bowel or bladder trouble.  Increasing pain.  Or weakness.

## 2018-06-06 DIAGNOSIS — R030 Elevated blood-pressure reading, without diagnosis of hypertension: Secondary | ICD-10-CM | POA: Diagnosis not present

## 2018-06-06 DIAGNOSIS — Z6830 Body mass index (BMI) 30.0-30.9, adult: Secondary | ICD-10-CM | POA: Diagnosis not present

## 2018-06-06 DIAGNOSIS — M5416 Radiculopathy, lumbar region: Secondary | ICD-10-CM | POA: Diagnosis not present

## 2018-06-09 ENCOUNTER — Other Ambulatory Visit: Payer: Self-pay | Admitting: Neurological Surgery

## 2018-06-09 DIAGNOSIS — M5416 Radiculopathy, lumbar region: Secondary | ICD-10-CM

## 2018-06-11 ENCOUNTER — Ambulatory Visit
Admission: RE | Admit: 2018-06-11 | Discharge: 2018-06-11 | Disposition: A | Discharge status: Home / Self Care | Payer: Medicare HMO | Source: Ambulatory Visit | Attending: Neurological Surgery | Admitting: Neurological Surgery

## 2018-06-11 DIAGNOSIS — M5416 Radiculopathy, lumbar region: Secondary | ICD-10-CM

## 2018-06-11 DIAGNOSIS — M5116 Intervertebral disc disorders with radiculopathy, lumbar region: Secondary | ICD-10-CM | POA: Diagnosis not present

## 2018-06-11 MED ORDER — METHYLPREDNISOLONE ACETATE 40 MG/ML INJ SUSP (RADIOLOG
120.0000 mg | Freq: Once | INTRAMUSCULAR | Status: AC
  Administered 2018-06-11: 120 mg via EPIDURAL

## 2018-06-11 MED ORDER — IOPAMIDOL (ISOVUE-M 200) INJECTION 41%
1.0000 mL | Freq: Once | INTRAMUSCULAR | Status: AC
  Administered 2018-06-11: 1 mL via EPIDURAL

## 2018-06-11 NOTE — Discharge Instructions (Signed)

## 2018-06-19 DIAGNOSIS — M5416 Radiculopathy, lumbar region: Secondary | ICD-10-CM | POA: Diagnosis not present

## 2018-06-23 ENCOUNTER — Other Ambulatory Visit: Payer: Self-pay | Admitting: Cardiology

## 2018-06-23 DIAGNOSIS — I10 Essential (primary) hypertension: Secondary | ICD-10-CM

## 2018-06-25 ENCOUNTER — Other Ambulatory Visit: Payer: Self-pay | Admitting: Neurological Surgery

## 2018-06-26 ENCOUNTER — Telehealth: Payer: Self-pay | Admitting: *Deleted

## 2018-06-26 NOTE — Telephone Encounter (Signed)
   [facility] Pre-operative Risk Assessment    Request for surgical clearance:  1. What type of surgery is being performed? RIGHT LUMBAR 3-4 MINIMALLY INVASIVE FAR LATERAL DISCECTOMY     2. When is this surgery scheduled? 06/30/18   3. What type of clearance is required (medical clearance vs. Pharmacy clearance to hold med vs. Both)? BOTH  4. Are there any medications that need to be held prior to surgery and how long?ASA   5. Practice name and name of physician performing surgery? [facility]    6. What is your office phone number? [phone]   7.   What is your office fax number? [phone]   8.   Anesthesia type (None, local, MAC, general) ?UNKNOWN

## 2018-06-26 NOTE — Telephone Encounter (Signed)
   Primary Cardiologist:Peter Martinique, MD  Chart reviewed as part of pre-operative protocol coverage. Because of [name] past medical history and time since last visit, he/she will require a follow-up visit in order to better assess preoperative cardiovascular risk.  Pre-op covering staff: - Please schedule appointment and call patient to inform them. - Please contact requesting surgeon's office via preferred method (i.e, phone, fax) to inform them of need for appointment prior to surgery.  If applicable, this message will also be routed to pharmacy pool and/or primary cardiologist for input on holding anticoagulant/antiplatelet agent as requested below so that this information is available at time of patient's appointment.   Cecilie Kicks, NP  06/26/2018, 4:55 PM

## 2018-06-27 ENCOUNTER — Encounter (HOSPITAL_COMMUNITY): Payer: Self-pay | Admitting: *Deleted

## 2018-06-27 ENCOUNTER — Other Ambulatory Visit: Payer: Self-pay

## 2018-06-27 DIAGNOSIS — H409 Unspecified glaucoma: Secondary | ICD-10-CM | POA: Diagnosis not present

## 2018-06-27 DIAGNOSIS — I10 Essential (primary) hypertension: Secondary | ICD-10-CM | POA: Diagnosis not present

## 2018-06-27 DIAGNOSIS — H547 Unspecified visual loss: Secondary | ICD-10-CM | POA: Diagnosis not present

## 2018-06-27 DIAGNOSIS — I69354 Hemiplegia and hemiparesis following cerebral infarction affecting left non-dominant side: Secondary | ICD-10-CM | POA: Diagnosis not present

## 2018-06-27 DIAGNOSIS — G3184 Mild cognitive impairment, so stated: Secondary | ICD-10-CM | POA: Diagnosis not present

## 2018-06-27 DIAGNOSIS — G8929 Other chronic pain: Secondary | ICD-10-CM | POA: Diagnosis not present

## 2018-06-27 DIAGNOSIS — E785 Hyperlipidemia, unspecified: Secondary | ICD-10-CM | POA: Diagnosis not present

## 2018-06-27 DIAGNOSIS — R69 Illness, unspecified: Secondary | ICD-10-CM | POA: Diagnosis not present

## 2018-06-27 DIAGNOSIS — H269 Unspecified cataract: Secondary | ICD-10-CM | POA: Diagnosis not present

## 2018-06-27 DIAGNOSIS — E669 Obesity, unspecified: Secondary | ICD-10-CM | POA: Diagnosis not present

## 2018-06-27 NOTE — Progress Notes (Signed)
Spoke with pt for pre-op call. Pt has a history of CAD with stents and CABG, mostly in 2014. Pt has not seen his cardiologist, Dr. Martinique since 2018. Pt has an appt with his office on Monday, 06/30/18 at 8 AM for cardiac clearance. Pt denies any recent chest pain or sob. Pt is Pre-diabetic, states that he does not check his blood sugar at home. Last A1C was 6.1 in 12/30/17.

## 2018-06-27 NOTE — Anesthesia Preprocedure Evaluation (Addendum)
Anesthesia Evaluation  Patient identified by MRN, date of birth, ID band Patient awake    Reviewed: Allergy & Precautions, NPO status , Patient's Chart, lab work & pertinent test results  Airway Mallampati: I  TM Distance: >3 FB Neck ROM: Full    Dental  (+) Partial Upper, Dental Advisory Given   Pulmonary Current Smoker,    breath sounds clear to auscultation       Cardiovascular hypertension, Pt. on home beta blockers and Pt. on medications + CAD and + Past MI   Rhythm:Regular Rate:Normal     Neuro/Psych CVA    GI/Hepatic negative GI ROS, Neg liver ROS,   Endo/Other  negative endocrine ROS  Renal/GU negative Renal ROS     Musculoskeletal negative musculoskeletal ROS (+)   Abdominal Normal abdominal exam  (+)   Peds  Hematology negative hematology ROS (+)   Anesthesia Other Findings   Reproductive/Obstetrics                           Anesthesia Physical Anesthesia Plan  ASA: III  Anesthesia Plan: General   Post-op Pain Management:    Induction: Intravenous  PONV Risk Score and Plan: 2 and Ondansetron and Dexamethasone  Airway Management Planned: Oral ETT  Additional Equipment: None  Intra-op Plan:   Post-operative Plan: Extubation in OR  Informed Consent: I have reviewed the patients History and Physical, chart, labs and discussed the procedure including the risks, benefits and alternatives for the proposed anesthesia with the patient or authorized representative who has indicated his/her understanding and acceptance.     Dental advisory given  Plan Discussed with:   Anesthesia Plan Comments: (See PAT note written 06/27/2018 by Myra Gianotti, PA-C. Proceeding with surgery will at least partially depend on cardiology recommendations from 06/30/18 8 AM evaluation.    Dailt METS > 4)      Anesthesia Quick Evaluation

## 2018-06-27 NOTE — Progress Notes (Addendum)
Anesthesia Chart Review: SAME DAY WORK-UP   Case:  [id number] Date/Time:  06/30/18 1332   Procedure:  Right minimally invasive Lumbar 3-4 Far lateral discectomy (Right ) - Right minimally invasive Lumbar 3-4 Far lateral discectomy   Anesthesia type:  General   Pre-op diagnosis:  Lumbar radiculopathy   Location:  [facility] OR ROOM 20 / [place] [facility]   Surgeon:  Judith Part, MD      DISCUSSION: Patient is a 58 year old male scheduled for the above procedure.  History includes smoking, CAD (MI ~ 2007, s/p LAD & CX stents in [place]; NSTEMI 12/2012 with occluded RCA with left-to-right collaterals, medical therapy; anterolateral ST elevation/pericarditis with tamponade s/p pericardiocentesis 01/24/13 and developed ruptured inferoposterior pseudoaneurysm with clotted tamponade/extensive necrotic myocardium and suspected septic emboli CVA, s/p median sternotomy, repair of rupture inferoposterior LV pseudoaneurysm with CorMatrix patch, CABG x1 SVG-OM1 01/28/13; readmitted for Dressler Syndrome/myopericarditis, treated with steroids 03/2013), ischemic cardiomyopathy, sinus bradycardia, pre-diabetes, HLD, CVA (likely septic emboli) 01/2013, C. difficile colitis (2014).   He was last seen by Dr. Martinique on 02/06/17. He is scheduled for cardiology preoperative evaluation by Jory Sims, DNP on 06/30/18. Proceeding with surgery will depend on cardiology recommendations. If any acute symptoms or if preoperative testing recommended then any elective surgery would need to be postponed. Last echo was in 2017 with EF 30-35%--he declined ICD. I updated OR Neurosurgery nursing staff.   ADDENDUM 06/20/2018: Pt seen today by Almyra Deforest, PA-C and cleared for surgery. Per his note:  "Preoperative clearance: Requested by Dr. Emelda Brothers for right lumbar minimally invasive discectomy.  Patient is able to complete at least 4 METS activity without any issue.  He is cleared to proceed with surgery without further work-up.  I have  discussed the case with his primary cardiologist Dr. Martinique who also agrees with the plan."   PROVIDERS: Ladell Pier, MD is PCP  - Martinique, Peter, MD is cardiologist. Last visit 02/06/17. He has a pending visit on 06/30/18 for preoperative cardiac evaluation.  Thompson Grayer, MD is EP cardiologist. Last visit 06/03/15. ICD implantation offered from primary prevention of sudden death given ischemic CM (EF 30-35%), NYHA Class II/III CHF, CAD, and sinus bradycardia. Patient declined and was advised to contact EP if he changes his mind.   LABS: If receives cardiac clearance then he will have labs on arrival on the day of surgery. A1c 6.1 on 12/30/17.    IMAGES: CTA head/neck 03/29/18: IMPRESSION: 1. No extracranial or intracranial flow reducing lesion or dissection. 2. Multiple remote BILATERAL cerebral and cerebellar infarcts were acute in 2014. No acute intracranial findings. 3. No posttraumatic sequelae are evident. No skull fracture or intracranial hemorrhage.   EKG: 03/29/18: Sinus rhythm Prolonged PR interval Nonspecific intraventricular conduction delay Inferolateral infarct, old since last tracing no significant change Confirmed by Noemi Chapel [id number]) on 03/29/2018 1:35:18 PM   CV: Echo (limited) 05/20/15: Study Conclusions - Left ventricle: Inferior and septal akinesis The cavity size was   moderately dilated. Wall thickness was increased in a pattern of   mild LVH. Systolic function was moderately to severely reduced.   The estimated ejection fraction was in the range of 30% to 35%. - Left atrium: The atrium was moderately dilated. - Atrial septum: No defect or patent foramen ovale was identified. (Comparison 11/24/14: LVEF 30-35%, diffuse hypokinesis, akinesis of the anteroseptal and inferoseptal myocardium, grade 2 diastolic dysfunction.)   Cardiac cath 01/21/13: Impression: 1. Triple vessel CAD with patent stents LAD and  Circumflex, known occlusion of RCA with  good left to right collaterals.  2. Chest pain, no culprit vessel found.  3. Moderate LV systolic dysfunction.  4. Possible myopericarditis vs vasospasm.  Recommendations: Continue medical management. Will add Imdur for possible vasospasm and colchicine for possible pericarditis.    Past Medical History:  Diagnosis Date  . Abnormal EKG    hx of ischemia showing up on ekg's  . Acute myopericarditis    a. 03/2013: readm with hypoxia, tachycardia, elevated ESR/CRP, elevated troponin with 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and myopericarditis; treated with steroids.  . Acute respiratory failure ([place])    a. 01/2013: VDRF. b. 03/2013: hypoxia requiring supp O2 during adm, resolved by discharge.  . C. difficile colitis    a. 01/2013 during prolonged adm. b. Recurred 03/2013.  . Cardiac tamponade    a. 01/2013 s/p drain.  . Coronary artery disease    a. s/p MI in 2009 in [place] with stenting of the LCX and LAD;  b. 12/2012 NSTEMI/CAD: LM nl, LAD patent prox stent, LCX 50-70 isr (FFR 0.84), RCA dom, 19m EF 40-45%-->Med Rx. c. 01/2013: anterolateral STEMI complicated by pericardial effusion (presumed purulent pericarditis) and tamponade s/p drain, ruptured LV pseudoaneurysm s/p CorMatrix patch, CABGx1 (SVG-OM1), fever, CVA, VDRF, C diff.  . CVA (cerebral infarction)    a. 01/2013 in setting of prolonged hospitalization - residual L arm weakness.  . 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[place]    a. 03/2013: readm with hypoxia, tachycardia, elevated ESR/CRP, elevated troponin with 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and myopericarditis; treated with steroids.  . Hyperlipidemia   . Hypokalemia   . Hyponatremia    a. During late 2014.  . Ischemic cardiomyopathy    a. Sept/Oct 2014: EF ~40% (ICM). b. 03/2013: EF 25-30%. Off ACEI due to hypotension (MIXED NICM/ICM).  .[place]ocardial [place]  . Pericardial effusion    a. 01/2013: pericardial effusion (presumed purulent pericarditis) and cardiac tamponade s/p drain. b. Persistent moderate  pericardial effusion 03/2013.  . Pre-diabetes   . Pseudoaneurysm of left ventricle of heart    a. 01/2013:  Ruptured inferoposterior LV pseudoaneurysm s/p CorMatrix patch.  . Sinus bradycardia    asymptomatic  . Stroke ([facility] 2009   weak lt side-lt arm  . Tobacco abuse    a. quit 12/2012.  . Wears dentures    top    Past Surgical History:  Procedure Laterality Date  . CARDIAC CATHETERIZATION  01/06/2013  . COLONOSCOPY WITH PROPOFOL N/A 07/03/2016   Procedure: COLONOSCOPY WITH PROPOFOL;  Surgeon: SManus Gunning MD;  Location: [facility];  Service: Gastroenterology;  Laterality: N/A;  . CORONARY ANGIOPLASTY WITH STENT PLACEMENT  2000's X 2   "1 + 1" (01/06/2013)  . CORONARY ARTERY BYPASS GRAFT N/A 01/28/2013   Procedure: CORONARY ARTERY BYPASS GRAFTING (CABG);  Surgeon: SMelrose Nakayama MD;  [facility]  Service: Open Heart Surgery;  Laterality: N/A;  CABG x one, using left greater saphenous vein harvested endoscopically  . INTRAOPERATIVE TRANSESOPHAGEAL ECHOCARDIOGRAM N/A 01/28/2013   Procedure: INTRAOPERATIVE TRANSESOPHAGEAL ECHOCARDIOGRAM;  Surgeon: SMelrose Nakayama MD;  [place]  Service: Open Heart Surgery;  Laterality: N/A;  . LEFT HEART CATHETERIZATION WITH CORONARY ANGIOGRAM N/A 01/06/2013   Procedure: LEFT HEART CATHETERIZATION WITH CORONARY ANGIOGRAM;  Surgeon: Peter M JMartinique MD;  [facility];  Service: Cardiovascular;  Laterality: N/A;  . LEFT HEART CATHETERIZATION WITH CORONARY ANGIOGRAM N/A 01/21/2013   Procedure: LEFT HEART CATHETERIZATION WITH CORONARY ANGIOGRAM;  Surgeon: CBurnell Blanks MD;  Location:  [place] CATH LAB;  Service: Cardiovascular;  Laterality: N/A;  . MASS EXCISION Right 07/21/2014   Procedure: EXCISION RIGHT NECK MASS;  Surgeon: Erroll Luna, MD;  Location: [place];  Service: General;  Laterality: Right;  . PERICARDIAL TAP N/A 01/24/2013   Procedure: PERICARDIAL TAP;  Surgeon: Blane Ohara,  MD;  Location: [facility] CATH LAB;  Service: Cardiovascular;  Laterality: N/A;  . RIGHT HEART CATHETERIZATION Right 01/24/2013   Procedure: RIGHT HEART CATH;  Surgeon: Blane Ohara, MD;  Location: [facility] CATH LAB;  Service: Cardiovascular;  Laterality: Right;  . VENTRICULAR ANEURYSM RESECTION N/A 01/28/2013   Procedure: LEFT VENTRICULAR ANEURYSM REPAIR;  Surgeon: Melrose Nakayama, MD;  Location: [place];  Service: Open Heart Surgery;  Laterality: N/A;    MEDICATIONS: No current facility-administered medications for this encounter.    . ALPHAGAN P 0.1 % SOLN  . atorvastatin (LIPITOR) 80 MG tablet  . carvedilol (COREG) 25 MG tablet  . Cholecalciferol (VITAMIN D) 2000 units tablet  . latanoprost (XALATAN) 0.005 % ophthalmic solution  . lisinopril (PRINIVIL,ZESTRIL) 10 MG tablet  . Multiple Vitamin (MULTIVITAMIN WITH MINERALS) TABS tablet  . nitroGLYCERIN (NITROSTAT) 0.4 MG SL tablet  . sildenafil (VIAGRA) 100 MG tablet  . aspirin 81 MG EC tablet  . HYDROcodone-acetaminophen (NORCO/VICODIN) 5-325 MG tablet  . methylPREDNISolone (MEDROL DOSEPAK) 4 MG TBPK tablet    Myra Gianotti, PA-C Surgical Short Stay/Anesthesiology [facility] Phone [phone] [facility] Phone [phone] 06/27/2018 4:28 PM

## 2018-06-30 ENCOUNTER — Ambulatory Visit (HOSPITAL_COMMUNITY): Payer: Medicare HMO | Admitting: Vascular Surgery

## 2018-06-30 ENCOUNTER — Encounter (HOSPITAL_COMMUNITY)
Admission: RE | Disposition: A | Discharge status: Home / Self Care | Payer: Self-pay | Source: Home / Self Care | Attending: Neurological Surgery

## 2018-06-30 ENCOUNTER — Ambulatory Visit (HOSPITAL_COMMUNITY)
Admission: RE | Admit: 2018-06-30 | Discharge: 2018-06-30 | Disposition: A | Discharge status: Home / Self Care | Payer: Medicare HMO | Attending: Neurological Surgery | Admitting: Neurological Surgery

## 2018-06-30 ENCOUNTER — Ambulatory Visit (HOSPITAL_COMMUNITY): Payer: Medicare HMO

## 2018-06-30 ENCOUNTER — Encounter: Payer: Self-pay | Admitting: Physician Assistant

## 2018-06-30 ENCOUNTER — Ambulatory Visit (INDEPENDENT_AMBULATORY_CARE_PROVIDER_SITE_OTHER): Payer: Medicare HMO | Admitting: Physician Assistant

## 2018-06-30 ENCOUNTER — Other Ambulatory Visit: Payer: Self-pay

## 2018-06-30 ENCOUNTER — Encounter (HOSPITAL_COMMUNITY): Payer: Self-pay | Admitting: *Deleted

## 2018-06-30 VITALS — BP 114/78 | HR 56 | Ht 72.0 in | Wt 231.8 lb

## 2018-06-30 DIAGNOSIS — Z951 Presence of aortocoronary bypass graft: Secondary | ICD-10-CM | POA: Diagnosis not present

## 2018-06-30 DIAGNOSIS — I255 Ischemic cardiomyopathy: Secondary | ICD-10-CM | POA: Diagnosis not present

## 2018-06-30 DIAGNOSIS — Z01810 Encounter for preprocedural cardiovascular examination: Secondary | ICD-10-CM | POA: Diagnosis not present

## 2018-06-30 DIAGNOSIS — M5116 Intervertebral disc disorders with radiculopathy, lumbar region: Secondary | ICD-10-CM | POA: Diagnosis not present

## 2018-06-30 DIAGNOSIS — E785 Hyperlipidemia, unspecified: Secondary | ICD-10-CM | POA: Diagnosis not present

## 2018-06-30 DIAGNOSIS — Z79899 Other long term (current) drug therapy: Secondary | ICD-10-CM | POA: Diagnosis not present

## 2018-06-30 DIAGNOSIS — F17210 Nicotine dependence, cigarettes, uncomplicated: Secondary | ICD-10-CM | POA: Insufficient documentation

## 2018-06-30 DIAGNOSIS — R69 Illness, unspecified: Secondary | ICD-10-CM | POA: Diagnosis not present

## 2018-06-30 DIAGNOSIS — I25810 Atherosclerosis of coronary artery bypass graft(s) without angina pectoris: Secondary | ICD-10-CM

## 2018-06-30 DIAGNOSIS — I10 Essential (primary) hypertension: Secondary | ICD-10-CM | POA: Diagnosis not present

## 2018-06-30 DIAGNOSIS — R7303 Prediabetes: Secondary | ICD-10-CM | POA: Insufficient documentation

## 2018-06-30 DIAGNOSIS — I233 Rupture of cardiac wall without hemopericardium as current complication following acute myocardial infarction: Secondary | ICD-10-CM | POA: Diagnosis not present

## 2018-06-30 DIAGNOSIS — Z981 Arthrodesis status: Secondary | ICD-10-CM | POA: Diagnosis not present

## 2018-06-30 DIAGNOSIS — I252 Old myocardial infarction: Secondary | ICD-10-CM | POA: Diagnosis not present

## 2018-06-30 DIAGNOSIS — Z8673 Personal history of transient ischemic attack (TIA), and cerebral infarction without residual deficits: Secondary | ICD-10-CM

## 2018-06-30 DIAGNOSIS — Z419 Encounter for procedure for purposes other than remedying health state, unspecified: Secondary | ICD-10-CM

## 2018-06-30 DIAGNOSIS — I2510 Atherosclerotic heart disease of native coronary artery without angina pectoris: Secondary | ICD-10-CM | POA: Insufficient documentation

## 2018-06-30 DIAGNOSIS — M5416 Radiculopathy, lumbar region: Secondary | ICD-10-CM | POA: Diagnosis not present

## 2018-06-30 DIAGNOSIS — Z955 Presence of coronary angioplasty implant and graft: Secondary | ICD-10-CM | POA: Insufficient documentation

## 2018-06-30 DIAGNOSIS — M5126 Other intervertebral disc displacement, lumbar region: Secondary | ICD-10-CM

## 2018-06-30 DIAGNOSIS — I213 ST elevation (STEMI) myocardial infarction of unspecified site: Secondary | ICD-10-CM

## 2018-06-30 DIAGNOSIS — Z7982 Long term (current) use of aspirin: Secondary | ICD-10-CM | POA: Insufficient documentation

## 2018-06-30 HISTORY — PX: LUMBAR LAMINECTOMY/ DECOMPRESSION WITH MET-RX: SHX5959

## 2018-06-30 HISTORY — DX: Prediabetes: R73.03

## 2018-06-30 LAB — BASIC METABOLIC PANEL
ANION GAP: 10 (ref 5–15)
BUN: 10 mg/dL (ref 6–20)
CO2: 22 mmol/L (ref 22–32)
CREATININE: 0.85 mg/dL (ref 0.61–1.24)
Calcium: 9.5 mg/dL (ref 8.9–10.3)
Chloride: 106 mmol/L (ref 98–111)
GFR calc non Af Amer: >60 mL/min (ref >60)
Glucose, Bld: 92 mg/dL (ref 70–99)
Potassium: 3.8 mmol/L (ref 3.5–5.1)
Sodium: 138 mmol/L (ref 135–145)

## 2018-06-30 LAB — CBC
HCT: 44.3 % (ref 39.0–52.0)
Hemoglobin: 14.6 g/dL (ref 13.0–17.0)
MCH: 29 pg (ref 26.0–34.0)
MCHC: 33 g/dL (ref 30.0–36.0)
MCV: 87.9 fL (ref 80.0–100.0)
Platelets: 142 10*3/uL — ABNORMAL LOW (ref 150–400)
RBC: 5.04 MIL/uL (ref 4.22–5.81)
RDW: 13.1 % (ref 11.5–15.5)
WBC: 5.3 10*3/uL (ref 4.0–10.5)
nRBC: 0 % (ref 0.0–0.2)

## 2018-06-30 LAB — GLUCOSE, CAPILLARY
Glucose-Capillary: 92 mg/dL (ref 70–99)
Glucose-Capillary: 73 mg/dL (ref 70–99)

## 2018-06-30 SURGERY — LUMBAR LAMINECTOMY/ DECOMPRESSION WITH MET-RX
Anesthesia: General | Laterality: Right

## 2018-06-30 MED ORDER — THROMBIN 5000 UNITS EX SOLR
OROMUCOSAL | Status: DC | PRN
  Administered 2018-06-30: 16:00:00 via TOPICAL

## 2018-06-30 MED ORDER — MIDAZOLAM HCL 2 MG/2ML IJ SOLN
INTRAMUSCULAR | Status: DC | PRN
  Administered 2018-06-30 (×2): 1 mg via INTRAVENOUS

## 2018-06-30 MED ORDER — PROMETHAZINE HCL 25 MG/ML IJ SOLN
6.2500 mg | INTRAMUSCULAR | Status: DC | PRN
  Administered 2018-06-30: 6.25 mg via INTRAVENOUS

## 2018-06-30 MED ORDER — ACETAMINOPHEN 10 MG/ML IV SOLN: 1000.0000 mg | Freq: Once | INTRAVENOUS | Status: DC | PRN

## 2018-06-30 MED ORDER — OXYCODONE HCL 5 MG PO TABS
ORAL_TABLET | ORAL | Status: AC
  Administered 2018-06-30: 5 mg via ORAL
  Filled 2018-06-30: qty 1

## 2018-06-30 MED ORDER — PROMETHAZINE HCL 25 MG/ML IJ SOLN
INTRAMUSCULAR | Status: AC
  Administered 2018-06-30: 6.25 mg via INTRAVENOUS
  Filled 2018-06-30: qty 1

## 2018-06-30 MED ORDER — MIDAZOLAM HCL 2 MG/2ML IJ SOLN
INTRAMUSCULAR | Status: AC
  Filled 2018-06-30: qty 2

## 2018-06-30 MED ORDER — CHLORHEXIDINE GLUCONATE CLOTH 2 % EX PADS: 6.0000 | MEDICATED_PAD | Freq: Once | CUTANEOUS | Status: DC

## 2018-06-30 MED ORDER — ACETAMINOPHEN 10 MG/ML IV SOLN
INTRAVENOUS | Status: AC
  Filled 2018-06-30: qty 100

## 2018-06-30 MED ORDER — SUGAMMADEX SODIUM 200 MG/2ML IV SOLN
INTRAVENOUS | Status: DC | PRN
  Administered 2018-06-30: 100 mg via INTRAVENOUS
  Administered 2018-06-30: 200 mg via INTRAVENOUS

## 2018-06-30 MED ORDER — THROMBIN 5000 UNITS EX SOLR
CUTANEOUS | Status: AC
  Filled 2018-06-30: qty 5000

## 2018-06-30 MED ORDER — LACTATED RINGERS IV SOLN: INTRAVENOUS | Status: DC

## 2018-06-30 MED ORDER — SODIUM CHLORIDE 0.9 % IV SOLN
INTRAVENOUS | Status: DC | PRN
  Administered 2018-06-30: 15 ug/min via INTRAVENOUS

## 2018-06-30 MED ORDER — ACETAMINOPHEN 160 MG/5ML PO SOLN: 325.0000 mg | Freq: Once | ORAL | Status: DC

## 2018-06-30 MED ORDER — LIDOCAINE 2% (20 MG/ML) 5 ML SYRINGE
INTRAMUSCULAR | Status: AC
  Filled 2018-06-30: qty 5

## 2018-06-30 MED ORDER — ASPIRIN 81 MG PO TBEC: 81.0000 mg | DELAYED_RELEASE_TABLET | Freq: Every day | ORAL | 3 refills | Status: DC

## 2018-06-30 MED ORDER — FENTANYL CITRATE (PF) 250 MCG/5ML IJ SOLN
INTRAMUSCULAR | Status: AC
  Filled 2018-06-30: qty 5

## 2018-06-30 MED ORDER — ROCURONIUM BROMIDE 10 MG/ML (PF) SYRINGE
PREFILLED_SYRINGE | INTRAVENOUS | Status: DC | PRN
  Administered 2018-06-30: 10 mg via INTRAVENOUS
  Administered 2018-06-30 (×3): 20 mg via INTRAVENOUS
  Administered 2018-06-30: 10 mg via INTRAVENOUS
  Administered 2018-06-30: 50 mg via INTRAVENOUS

## 2018-06-30 MED ORDER — LACTATED RINGERS IV SOLN
INTRAVENOUS | Status: DC
  Administered 2018-06-30 (×2): via INTRAVENOUS

## 2018-06-30 MED ORDER — HYDROMORPHONE HCL 1 MG/ML IJ SOLN: 0.2500 mg | INTRAMUSCULAR | Status: DC | PRN

## 2018-06-30 MED ORDER — ONDANSETRON HCL 4 MG/2ML IJ SOLN
INTRAMUSCULAR | Status: AC
  Filled 2018-06-30: qty 2

## 2018-06-30 MED ORDER — CEFAZOLIN SODIUM-DEXTROSE 2-4 GM/100ML-% IV SOLN
2.0000 g | INTRAVENOUS | Status: AC
  Administered 2018-06-30: 2 g via INTRAVENOUS
  Filled 2018-06-30: qty 100

## 2018-06-30 MED ORDER — FENTANYL CITRATE (PF) 250 MCG/5ML IJ SOLN
INTRAMUSCULAR | Status: DC | PRN
  Administered 2018-06-30: 150 ug via INTRAVENOUS
  Administered 2018-06-30 (×7): 50 ug via INTRAVENOUS

## 2018-06-30 MED ORDER — DEXAMETHASONE SODIUM PHOSPHATE 10 MG/ML IJ SOLN
INTRAMUSCULAR | Status: DC | PRN
  Administered 2018-06-30: 5 mg via INTRAVENOUS

## 2018-06-30 MED ORDER — EPHEDRINE SULFATE-NACL 50-0.9 MG/10ML-% IV SOSY
PREFILLED_SYRINGE | INTRAVENOUS | Status: DC | PRN
  Administered 2018-06-30: 10 mg via INTRAVENOUS
  Administered 2018-06-30: 5 mg via INTRAVENOUS

## 2018-06-30 MED ORDER — 0.9 % SODIUM CHLORIDE (POUR BTL) OPTIME
TOPICAL | Status: DC | PRN
  Administered 2018-06-30: 1000 mL

## 2018-06-30 MED ORDER — LIDOCAINE-EPINEPHRINE 1 %-1:100000 IJ SOLN
INTRAMUSCULAR | Status: DC | PRN
  Administered 2018-06-30: 10 mL via INTRADERMAL

## 2018-06-30 MED ORDER — SODIUM CHLORIDE 0.9 % IV SOLN
INTRAVENOUS | Status: DC | PRN
  Administered 2018-06-30: 16:00:00

## 2018-06-30 MED ORDER — ACETAMINOPHEN 325 MG PO TABS: 325.0000 mg | ORAL_TABLET | Freq: Once | ORAL | Status: DC

## 2018-06-30 MED ORDER — ROCURONIUM BROMIDE 50 MG/5ML IV SOSY
PREFILLED_SYRINGE | INTRAVENOUS | Status: AC
  Filled 2018-06-30: qty 5

## 2018-06-30 MED ORDER — PROPOFOL 10 MG/ML IV BOLUS
INTRAVENOUS | Status: AC
  Filled 2018-06-30: qty 20

## 2018-06-30 MED ORDER — PHENYLEPHRINE 40 MCG/ML (10ML) SYRINGE FOR IV PUSH (FOR BLOOD PRESSURE SUPPORT)
PREFILLED_SYRINGE | INTRAVENOUS | Status: DC | PRN
  Administered 2018-06-30 (×2): 80 ug via INTRAVENOUS

## 2018-06-30 MED ORDER — OXYCODONE HCL 5 MG PO TABS
5.0000 mg | ORAL_TABLET | ORAL | Status: DC | PRN
  Administered 2018-06-30: 5 mg via ORAL

## 2018-06-30 MED ORDER — DEXAMETHASONE SODIUM PHOSPHATE 10 MG/ML IJ SOLN
INTRAMUSCULAR | Status: AC
  Filled 2018-06-30: qty 1

## 2018-06-30 MED ORDER — ONDANSETRON HCL 4 MG/2ML IJ SOLN
INTRAMUSCULAR | Status: DC | PRN
  Administered 2018-06-30: 4 mg via INTRAVENOUS

## 2018-06-30 MED ORDER — OXYCODONE HCL 5 MG PO TABS: 5.0000 mg | ORAL_TABLET | ORAL | 0 refills | Status: DC | PRN

## 2018-06-30 MED ORDER — LIDOCAINE-EPINEPHRINE 1 %-1:100000 IJ SOLN
INTRAMUSCULAR | Status: AC
  Filled 2018-06-30: qty 1

## 2018-06-30 MED ORDER — ACETAMINOPHEN 10 MG/ML IV SOLN
INTRAVENOUS | Status: DC | PRN
  Administered 2018-06-30: 1000 mg via INTRAVENOUS

## 2018-06-30 MED ORDER — MEPERIDINE HCL 50 MG/ML IJ SOLN: 6.2500 mg | INTRAMUSCULAR | Status: DC | PRN

## 2018-06-30 MED ORDER — DIAZEPAM 5 MG PO TABS: 5.0000 mg | ORAL_TABLET | Freq: Three times a day (TID) | ORAL | 0 refills | Status: DC | PRN

## 2018-06-30 MED ORDER — LIDOCAINE 2% (20 MG/ML) 5 ML SYRINGE
INTRAMUSCULAR | Status: DC | PRN
  Administered 2018-06-30: 60 mg via INTRAVENOUS

## 2018-06-30 MED ORDER — PROPOFOL 10 MG/ML IV BOLUS
INTRAVENOUS | Status: DC | PRN
  Administered 2018-06-30 (×3): 50 mg via INTRAVENOUS

## 2018-06-30 SURGICAL SUPPLY — 57 items
ADH SKN CLS APL DERMABOND .7 (GAUZE/BANDAGES/DRESSINGS) ×1
BAG DECANTER FOR FLEXI CONT (MISCELLANEOUS) ×2 IMPLANT
BLADE CLIPPER SURG (BLADE) IMPLANT
BLADE SURG 11 STRL SS (BLADE) ×2 IMPLANT
BUR 2.5 MTCH HD 16 (BUR) ×1 IMPLANT
BUR MATCHSTICK NEURO 3.0 LAGG (BURR) IMPLANT
BUR PRECISION FLUTE 5.0 (BURR) IMPLANT
CANISTER SUCT 3000ML PPV (MISCELLANEOUS) ×2 IMPLANT
COVER WAND RF STERILE (DRAPES) ×2 IMPLANT
DECANTER SPIKE VIAL GLASS SM (MISCELLANEOUS) ×2 IMPLANT
DERMABOND ADVANCED (GAUZE/BANDAGES/DRESSINGS) ×1
DERMABOND ADVANCED .7 DNX12 (GAUZE/BANDAGES/DRESSINGS) ×1 IMPLANT
DRAPE C-ARM 42X72 X-RAY (DRAPES) ×4 IMPLANT
DRAPE LAPAROTOMY 100X72X124 (DRAPES) ×2 IMPLANT
DRAPE MICROSCOPE LEICA (MISCELLANEOUS) ×2 IMPLANT
DRAPE SURG 17X23 STRL (DRAPES) ×2 IMPLANT
DURAPREP 26ML APPLICATOR (WOUND CARE) ×2 IMPLANT
ELECT BLADE 6.5 EXT (BLADE) ×2 IMPLANT
ELECT REM PT RETURN 9FT ADLT (ELECTROSURGICAL) ×2
ELECTRODE REM PT RTRN 9FT ADLT (ELECTROSURGICAL) ×1 IMPLANT
GAUZE 4X4 16PLY RFD (DISPOSABLE) IMPLANT
GAUZE SPONGE 4X4 12PLY STRL (GAUZE/BANDAGES/DRESSINGS) IMPLANT
GLOVE BIO SURGEON STRL SZ7.5 (GLOVE) ×2 IMPLANT
GLOVE BIOGEL PI IND STRL 7.5 (GLOVE) ×1 IMPLANT
GLOVE BIOGEL PI INDICATOR 7.5 (GLOVE) ×1
GLOVE ECLIPSE 7.0 STRL STRAW (GLOVE) ×1 IMPLANT
GLOVE EXAM NITRILE LRG STRL (GLOVE) IMPLANT
GLOVE EXAM NITRILE XL STR (GLOVE) IMPLANT
GLOVE EXAM NITRILE XS STR PU (GLOVE) IMPLANT
GLOVE SURG SS PI 7.5 STRL IVOR (GLOVE) ×1 IMPLANT
GOWN STRL REUS W/ TWL LRG LVL3 (GOWN DISPOSABLE) ×2 IMPLANT
GOWN STRL REUS W/ TWL XL LVL3 (GOWN DISPOSABLE) IMPLANT
GOWN STRL REUS W/TWL 2XL LVL3 (GOWN DISPOSABLE) ×1 IMPLANT
GOWN STRL REUS W/TWL LRG LVL3 (GOWN DISPOSABLE) ×6
GOWN STRL REUS W/TWL XL LVL3 (GOWN DISPOSABLE)
HEMOSTAT POWDER KIT SURGIFOAM (HEMOSTASIS) ×2 IMPLANT
KIT BASIN OR (CUSTOM PROCEDURE TRAY) ×2 IMPLANT
KIT TURNOVER KIT B (KITS) ×2 IMPLANT
NDL HYPO 18GX1.5 BLUNT FILL (NEEDLE) IMPLANT
NDL SPNL 18GX3.5 QUINCKE PK (NEEDLE) ×1 IMPLANT
NEEDLE HYPO 18GX1.5 BLUNT FILL (NEEDLE) IMPLANT
NEEDLE HYPO 22GX1.5 SAFETY (NEEDLE) ×2 IMPLANT
NEEDLE SPNL 18GX3.5 QUINCKE PK (NEEDLE) ×2 IMPLANT
NS IRRIG 1000ML POUR BTL (IV SOLUTION) ×2 IMPLANT
PACK LAMINECTOMY NEURO (CUSTOM PROCEDURE TRAY) ×2 IMPLANT
PAD ARMBOARD 7.5X6 YLW CONV (MISCELLANEOUS) ×6 IMPLANT
RUBBERBAND STERILE (MISCELLANEOUS) ×4 IMPLANT
SPONGE LAP 4X18 RFD (DISPOSABLE) IMPLANT
SUT MNCRL AB 3-0 PS2 18 (SUTURE) IMPLANT
SUT VIC AB 0 CT1 18XCR BRD8 (SUTURE) IMPLANT
SUT VIC AB 0 CT1 8-18 (SUTURE)
SUT VIC AB 2-0 CP2 18 (SUTURE) ×2 IMPLANT
SUT VIC AB 4-0 RB1 18 (SUTURE) IMPLANT
SYR 3ML LL SCALE MARK (SYRINGE) IMPLANT
TOWEL GREEN STERILE (TOWEL DISPOSABLE) ×2 IMPLANT
TOWEL GREEN STERILE FF (TOWEL DISPOSABLE) ×2 IMPLANT
WATER STERILE IRR 1000ML POUR (IV SOLUTION) ×2 IMPLANT

## 2018-06-30 NOTE — Telephone Encounter (Signed)
Manually faxed clearance note to the office of Neurosurgery and Spine. Called the office to make sure letter was received. Spoke with Janett Billow, RN she confirmed the letter was received.

## 2018-06-30 NOTE — Anesthesia Postprocedure Evaluation (Signed)
Anesthesia Post Note  Patient: [name]  Procedure(s) Performed: Right minimally invasive Lumbar Three-Four Far lateral discectomy (Right )     Patient location during evaluation: PACU Anesthesia Type: General Level of consciousness: awake and alert Pain management: pain level controlled Vital Signs Assessment: post-procedure vital signs reviewed and stable Respiratory status: spontaneous breathing, nonlabored ventilation, respiratory function stable and patient connected to nasal cannula oxygen Cardiovascular status: blood pressure returned to baseline and stable Postop Assessment: no apparent nausea or vomiting Anesthetic complications: no    Last Vitals:  Vitals:   06/30/18 1955 06/30/18 2010  BP: (!) 130/96 127/86  Pulse: 63 76  Resp: 18 (!) 23  Temp:  (!) 36.3 C  SpO2: 98% 97%    Last Pain:  Vitals:   06/30/18 1955  TempSrc:   PainSc: 6                  Christino Mcglinchey COKER

## 2018-06-30 NOTE — H&P (Signed)
Surgical H&P Update  HPI: 58 y.o. man with RLE radicular pain that did not respond to non-surgical treatment, here for far lateral discectomy. Symptoms consist of radicular pain and numbness in the RLE in the right L3 distribution and radiographic workup revealed a far lateral L3-4 disc herniation that matches his symptoms. No changes in health since she was last seen. Still having severe right leg pain and wishes to proceed with surgery.  PMHx:  Past Medical History:  Diagnosis Date  . Abnormal EKG    hx of ischemia showing up on ekg's  . Acute myopericarditis    a. 03/2013: readm with hypoxia, tachycardia, elevated ESR/CRP, elevated troponin with 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and myopericarditis; treated with steroids.  . Acute respiratory failure ([place])    a. 01/2013: VDRF. b. 03/2013: hypoxia requiring supp O2 during adm, resolved by discharge.  . C. difficile colitis    a. 01/2013 during prolonged adm. b. Recurred 03/2013.  . Cardiac tamponade    a. 01/2013 s/p drain.  . Coronary artery disease    a. s/p MI in 2009 in [place] with stenting of the LCX and LAD;  b. 12/2012 NSTEMI/CAD: LM nl, LAD patent prox stent, LCX 50-70 isr (FFR 0.84), RCA dom, 174m EF 40-45%-->Med Rx. c. 01/2013: anterolateral STEMI complicated by pericardial effusion (presumed purulent pericarditis) and tamponade s/p drain, ruptured LV pseudoaneurysm s/p CorMatrix patch, CABGx1 (SVG-OM1), fever, CVA, VDRF, C diff.  . CVA (cerebral infarction)    a. 01/2013 in setting of prolonged hospitalization - residual L arm weakness.  . 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([place]    a. 03/2013: readm with hypoxia, tachycardia, elevated ESR/CRP, elevated troponin with 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and myopericarditis; treated with steroids.  . Hyperlipidemia   . Hypokalemia   . Hyponatremia    a. During late 2014.  . Ischemic cardiomyopathy    a. Sept/Oct 2014: EF ~40% (ICM). b. 03/2013: EF 25-30%. Off ACEI due to hypotension (MIXED NICM/ICM).  .[place] KitchenMyocardial infarction ([place]  2009  . Pericardial effusion    a. 01/2013: pericardial effusion (presumed purulent pericarditis) and cardiac tamponade s/p drain. b. Persistent moderate pericardial effusion 03/2013.  . Pre-diabetes   . Pseudoaneurysm of left ventricle of heart    a. 01/2013:  Ruptured inferoposterior LV pseudoaneurysm s/p CorMatrix patch.  . Sinus bradycardia    asymptomatic  . Stroke ([facility] 2009   weak lt side-lt arm  . Tobacco abuse    a. quit 12/2012.  . Wears dentures    top   FamHx:  Family History  Problem Relation Age of Onset  . Heart attack Mother   . Diabetes Mother   . Hypertension Mother   . Hypertension Father   . CAD Other   . HIV Brother   . Stroke Neg Hx    SocHx:  reports that he has been smoking cigarettes. He has a 52.50 pack-year smoking history. He has never used smokeless tobacco. He reports that he does not drink alcohol or use drugs.  Physical Exam: AOx3, PERRL, FS, TM  Strength 5/5 x4, SILTx4  Assesment/Plan: 58y.o. man with lumbar radiculopathy 2/2 far lateral disc herniation, here for minimally invasive L3-4 far lateral microdiscectomy. Risks, benefits, and alternatives discussed and the patient would like to continue with surgery.  -OR today  TJudith Part MD 06/30/18 1:58 PM

## 2018-06-30 NOTE — Progress Notes (Signed)
Cardiology Office Note    Date:  06/30/2018   ID:  [name], DOB Sep 30, 1960, MRN [id number]  PCP:  Ladell Pier, MD  Cardiologist:  Dr. Martinique  Chief Complaint  Patient presents with  . Medical Clearance    Pre Op, lumbar surgery by DR Emelda Brothers     History of Present Illness:  [name] is a 58 y.o. male with PMH of hyperlipidemia, history of CVA, prediabetes, CAD, ischemic cardiomyopathy, history of cardiac tamponade and mild pericarditis.  He has stents to the left circumflex and LAD in 2007.  In September 2014, he presented with chest pain.  Repeat cardiac catheterization showed totally occluded RCA and a 50% in-stent restenosis of left circumflex stent.  Medical therapy was recommended.  He was admitted in October 2014 with a large pericardial effusion with tamponade due to ruptured LV pseudoaneurysm.  Pericardial drain was placed.  Prior to discharge, he developed signs of stroke with left arm weakness and left visual field deficit.  He then developed hemodynamic collapse with hypotension and tachycardia along with fever required intubation.  Repeat echocardiogram showed complex pericardial effusion with right ventricular collapse and a tamponade.  He underwent emergent median sternotomy and repair of ruptured inferoseptal posterior LV pseudoaneurysm with a core matrix patch and CABG x1 with SVG to OM by Dr. Roxan Hockey.  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  His EF by echocardiogram at the time was 25% with moderate pericardial effusion.  He also had recurrent C. difficile infection as well.  Echocardiogram by August 2015 showed EF of 30%.  Last echocardiogram in February 2017 showed EF of 30 to 35%.  His last visit with Dr. Martinique was on 02/06/2017, at which time he was doing well.  He has failed to follow-up since.  Since the last office visit,  patient was seen in the emergency room several times beginning in December 2019.  He presented to the ED on 03/29/2018 with syncope while helping a friend moving a piece of furniture.  This was preceded by a significant sharp pain in his lower back.  CTA of the head did not show any acute issue, it does show multiple remote bilateral cerebral and cerebellar infarcts.  He was sent home on muscle relaxer.  Recent lumbar MRI obtained on 05/29/2018 showed L3-4 disc herniation and L4-5 bilateral foraminal stenosis.  Patient presents this morning for preoperative clearance prior to lumbar surgery later today.  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  He walks comfortably on his job.  He denies any exertional symptom with climbing up 2 flight of stairs or walk 2-4 blocks away from his home and back.  He is able to complete at least 4 METS of activity without any issue.  I discussed the case with Dr. Martinique his primary cardiologist, he is cleared to proceed with back surgery without any further work-up.  He has been holding the aspirin for the past week and should restart his aspirin after the surgery as soon as possible.  Since his last echocardiogram in February 2017 continue to show low EF, Dr. Martinique did recommend a nonurgent echocardiogram at some point.  This does not need to be obtained prior to the surgery though, and unlikely to change the risk of the surgery.  Despite his low EF, he does not have any heart failure symptoms such as lower  extremity edema, orthopnea or PND.  His EKG today continue to show inferior infarct, this is unchanged when compared with previous EKG.   Past Medical History:  Diagnosis Date  . Abnormal EKG    hx of ischemia showing up on ekg's  . Acute myopericarditis    a. 03/2013: readm with hypoxia, tachycardia, elevated ESR/CRP, elevated troponin with 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and myopericarditis; treated with steroids.  . Acute respiratory failure ([place])    a. 01/2013: VDRF. b.  03/2013: hypoxia requiring supp O2 during adm, resolved by discharge.  . C. difficile colitis    a. 01/2013 during prolonged adm. b. Recurred 03/2013.  . Cardiac tamponade    a. 01/2013 s/p drain.  . Coronary artery disease    a. s/p MI in 2009 in [place] with stenting of the LCX and LAD;  b. 12/2012 NSTEMI/CAD: LM nl, LAD patent prox stent, LCX 50-70 isr (FFR 0.84), RCA dom, 168m EF 40-45%-->Med Rx. c. 01/2013: anterolateral STEMI complicated by pericardial effusion (presumed purulent pericarditis) and tamponade s/p drain, ruptured LV pseudoaneurysm s/p CorMatrix patch, CABGx1 (SVG-OM1), fever, CVA, VDRF, C diff.  . CVA (cerebral infarction)    a. 01/2013 in setting of prolonged hospitalization - residual L arm weakness.  . 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    a. 03/2013: readm with hypoxia, tachycardia, elevated ESR/CRP, elevated troponin with 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and myopericarditis; treated with steroids.  . Hyperlipidemia   . Hypokalemia   . Hyponatremia    a. During late 2014.  . Ischemic cardiomyopathy    a. Sept/Oct 2014: EF ~40% (ICM). b. 03/2013: EF 25-30%. Off ACEI due to hypotension (MIXED NICM/ICM).  .[place]land KitchenMyocardial infarction ([place] 2009  . Pericardial effusion    a. 01/2013: pericardial effusion (presumed purulent pericarditis) and cardiac tamponade s/p drain. b. Persistent moderate pericardial effusion 03/2013.  . Pre-diabetes   . Pseudoaneurysm of left ventricle of heart    a. 01/2013:  Ruptured inferoposterior LV pseudoaneurysm s/p CorMatrix patch.  . Sinus bradycardia    asymptomatic  . Stroke ([facility] 2009   weak lt side-lt arm  . Tobacco abuse    a. quit 12/2012.  . Wears dentures    top    Past Surgical History:  Procedure Laterality Date  . CARDIAC CATHETERIZATION  01/06/2013  . COLONOSCOPY WITH PROPOFOL N/A 07/03/2016   Procedure: COLONOSCOPY WITH PROPOFOL;  Surgeon: SManus Gunning MD;  Location: [facility];  Service: Gastroenterology;  Laterality: N/A;  . CORONARY  ANGIOPLASTY WITH STENT PLACEMENT  2000's X 2   "1 + 1" (01/06/2013)  . CORONARY ARTERY BYPASS GRAFT N/A 01/28/2013   Procedure: CORONARY ARTERY BYPASS GRAFTING (CABG);  Surgeon: SMelrose Nakayama MD;  [place]  Service: Open Heart Surgery;  Laterality: N/A;  CABG x one, using left greater saphenous vein harvested endoscopically  . INTRAOPERATIVE TRANSESOPHAGEAL ECHOCARDIOGRAM N/A 01/28/2013   Procedure: INTRAOPERATIVE TRANSESOPHAGEAL ECHOCARDIOGRAM;  Surgeon: SMelrose Nakayama MD;  [place]  Service: Open Heart Surgery;  Laterality: N/A;  . LEFT HEART CATHETERIZATION WITH CORONARY ANGIOGRAM N/A 01/06/2013   Procedure: LEFT HEART CATHETERIZATION WITH CORONARY ANGIOGRAM;  Surgeon: Peter M JMartinique MD;  [facility];  Service: Cardiovascular;  Laterality: N/A;  . LEFT HEART CATHETERIZATION WITH CORONARY ANGIOGRAM N/A 01/21/2013   Procedure: LEFT HEART CATHETERIZATION WITH CORONARY ANGIOGRAM;  Surgeon: CBurnell Blanks MD;  [facility];  Service: Cardiovascular;  Laterality: N/A;  . MASS EXCISION Right 07/21/2014   Procedure: EXCISION RIGHT NECK MASS;  Surgeon: TErroll Luna  MD;  Location: [place];  Service: General;  Laterality: Right;  . PERICARDIAL TAP N/A 01/24/2013   Procedure: PERICARDIAL TAP;  Surgeon: Blane Ohara, MD;  Location: [facility] CATH LAB;  Service: Cardiovascular;  Laterality: N/A;  . RIGHT HEART CATHETERIZATION Right 01/24/2013   Procedure: RIGHT HEART CATH;  Surgeon: Blane Ohara, MD;  Location: [facility] CATH LAB;  Service: Cardiovascular;  Laterality: Right;  . VENTRICULAR ANEURYSM RESECTION N/A 01/28/2013   Procedure: LEFT VENTRICULAR ANEURYSM REPAIR;  Surgeon: Melrose Nakayama, MD;  Location: [place];  Service: Open Heart Surgery;  Laterality: N/A;    Current Medications: Outpatient Medications Prior to Visit  Medication Sig Dispense Refill  . ALPHAGAN P 0.1 % SOLN Place 1 drop into both eyes 2 (two) times daily.     Marland Kitchen aspirin 81 MG EC tablet Take 1 tablet (81 mg total) by mouth daily. 90 tablet 3  . atorvastatin (LIPITOR) 80 MG tablet Take 1 tablet (80 mg total) by mouth daily. 30 tablet 6  . carvedilol (COREG) 25 MG tablet Take 1 tablet (25 mg total) by mouth 2 (two) times daily with a meal. NEED OV. 30 tablet 0  . Cholecalciferol (VITAMIN D) 2000 units tablet Take 2,000 Units by mouth daily.    Marland Kitchen HYDROcodone-acetaminophen (NORCO/VICODIN) 5-325 MG tablet Take 1-2 tablets by mouth every 4 (four) hours as needed. 10 tablet 0  . latanoprost (XALATAN) 0.005 % ophthalmic solution Place 1 drop into both eyes daily.  12  . lisinopril (PRINIVIL,ZESTRIL) 10 MG tablet Take 1 tablet (10 mg total) by mouth 2 (two) times daily. 60 tablet 6  . methylPREDNISolone (MEDROL DOSEPAK) 4 MG TBPK tablet As directed 21 tablet 0  . Multiple Vitamin (MULTIVITAMIN WITH MINERALS) TABS tablet Take 1 tablet by mouth daily.    . nitroGLYCERIN (NITROSTAT) 0.4 MG SL tablet Place 1 tablet (0.4 mg total) under the tongue every 5 (five) minutes as needed for chest pain (up to 3 doses). 25 tablet 0  . sildenafil (VIAGRA) 100 MG tablet Take 1 tablet (100 mg total) by mouth daily as needed for erectile dysfunction. 10 tablet 6   No facility-administered medications prior to visit.      Allergies:   Patient has no known allergies.   Social History   Socioeconomic History  . Marital status: Single    Spouse name: Not on file  . Number of children: 2  . Years of education: 12th  . Highest education level: Not on file  Occupational History  . Occupation: N/A  Social Needs  . Financial resource strain: Not on file  . Food insecurity:    Worry: Not on file    Inability: Not on file  . Transportation needs:    Medical: Not on file    Non-medical: Not on file  Tobacco Use  . Smoking status: Current Every Day Smoker    Packs/day: 1.50    Years: 35.00    Pack years: 52.50    Types: Cigarettes  . Smokeless tobacco: Never Used   Substance and Sexual Activity  . Alcohol use: No    Alcohol/week: 0.0 standard drinks  . Drug use: No  . Sexual activity: Yes  Lifestyle  . Physical activity:    Days per week: Not on file    Minutes per session: Not on file  . Stress: Not on file  Relationships  . Social connections:    Talks on phone: Not on file    Gets together: Not  on file    Attends religious service: Not on file    Active member of club or organization: Not on file    Attends meetings of clubs or organizations: Not on file    Relationship status: Not on file  Other Topics Concern  . Not on file  Social History Narrative   Lives in [place].  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  Quit smoking after MI 12/2012.   Caffeine Use: very rarely     Family History:  The patient's family history includes CAD in an other family member; Diabetes in his mother; HIV in his brother; Heart attack in his mother; Hypertension in his father and mother.   ROS:   Please see the history of present illness.    ROS All other systems reviewed and are negative.   PHYSICAL EXAM:   VS:  BP 114/78   Pulse (!) 56   Ht 6' (1.829 m)   Wt 231 lb 12.8 oz (105.1 kg)   BMI 31.44 kg/m    GEN: Well nourished, well developed, in no acute distress  HEENT: normal  Neck: no JVD, carotid bruits, or masses Cardiac: RRR; no murmurs, rubs, or gallops,no edema  Respiratory:  clear to auscultation bilaterally, normal work of breathing GI: soft, nontender, nondistended, + BS MS: no deformity or atrophy  Skin: warm and dry, no rash Neuro:  Alert and Oriented x 3, Strength and sensation are intact Psych: euthymic mood, full affect  Wt Readings from Last 3 Encounters:  06/30/18 231 lb 12.8 oz (105.1 kg)  06/04/18 229 lb 6.4 oz (104.1 kg)  03/29/18 256 lb (116.1 kg)      Studies/Labs Reviewed:   EKG:  EKG is ordered today.  The ekg ordered today demonstrates normal sinus rhythm with Q waves in the inferior leads consistent  with previous inferior infarct.  This is unchanged since at least 2018.  Recent Labs: 03/29/2018: ALT 23; BUN 10; Creatinine, Ser 0.96; Hemoglobin 15.0; Platelets 165; Potassium 4.1; Sodium 138   Lipid Panel    Component Value Date/Time   CHOL 158 12/30/2017 1450   TRIG 89 12/30/2017 1450   HDL 57 12/30/2017 1450   CHOLHDL 2.8 12/30/2017 1450   CHOLHDL 2.5 11/03/2015 1609   VLDL 19 11/03/2015 1609   LDLCALC 83 12/30/2017 1450    Additional studies/ records that were reviewed today include:   Echo 05/20/2015 LV EF: 30% -   35% Study Conclusions  - Left ventricle: Inferior and septal akinesis The cavity size was   moderately dilated. Wall thickness was increased in a pattern of   mild LVH. Systolic function was moderately to severely reduced.   The estimated ejection fraction was in the range of 30% to 35%. - Left atrium: The atrium was moderately dilated. - Atrial septum: No defect or patent foramen ovale was identified.   ASSESSMENT:    1. Preop cardiovascular exam   2. Coronary artery disease involving coronary bypass graft of native heart without angina pectoris   3. Prediabetes   4. Hyperlipidemia LDL goal <70   5. H/O: CVA (cerebrovascular accident)   6. Ischemic cardiomyopathy   7. Cardiac rupture after acute myocardial infarction [facility])      PLAN:  In order of problems listed above:  1. Preoperative clearance: Requested by Dr. Emelda Brothers for right lumbar minimally invasive discectomy.  Patient is able to complete at least 4 METS activity without any issue.  He is cleared to proceed with  surgery without further work-up.  I have discussed the case with his primary cardiologist Dr. Martinique who also agrees with the plan.  2. CAD: He has been holding aspirin for the past week in preparation for the surgery.  He will need to restart aspirin as soon as possible after the surgery at the discretion of the surgeon.  Continue statin and beta-blocker.  3.  Hyperlipidemia: His LDL has been borderline high in the past 3 years, lipid panel followed by his primary care provider.  LDL goal should be less than 70.  He if LDL remains elevated, we can either switch to 80 mg Lipitor to 40 mg of Crestor or add Zetia 10 mg daily  4. Prediabetes: Followed by primary care provider  5. History of CVA: Occurred in 2014, continue to have very mild balance issue after walking long distance  6. Ischemic cardiomyopathy: EF 30 to 35% on last echocardiogram in February 2017.  Patient will require a nonurgent echocardiogram prior to the next office visit.  This does not need to be done prior to the surgery though as he is already cleared for the surgery.    7. LV rupture: Underwent patch repair with CABG x1 by Dr. Roxan Hockey in 2014.    Medication Adjustments/Labs and Tests Ordered: Current medicines are reviewed at length with the patient today.  Concerns regarding medicines are outlined above.  Medication changes, Labs and Tests ordered today are listed in the Patient Instructions below. Patient Instructions  Medication Instructions:  Your physician recommends that you continue on your current medications as directed. Please refer to the Current Medication list given to you today.  If you need a refill on your cardiac medications before your next appointment, please call your pharmacy.   Lab work: NONE If you have labs (blood work) drawn today and your tests are completely normal, you will receive your results only by: Marland Kitchen MyChart Message (if you have MyChart) OR . A paper copy in the mail If you have any lab test that is abnormal or we need to change your treatment, we will call you to review the results.  Testing/Procedures: You need to schedule an ECHOCARDIOGRAM as soon as you are recovered from your back surgery within the next 6 months   Your physician has requested that you have an echocardiogram. Echocardiography is a painless test that uses sound  waves to create images of your heart. It provides your doctor with information about the size and shape of your heart and how well your heart's chambers and valves are working. This procedure takes approximately one hour. There are no restrictions for this procedure.   Follow-Up: At [facility], you and your health needs are our priority.  As part of our continuing mission to provide you with exceptional heart care, we have created designated Provider Care Teams.  These Care Teams include your primary Cardiologist (physician) and Advanced Practice Providers (APPs -  Physician Assistants and Nurse Practitioners) who all work together to provide you with the care you need, when you need it. You will need a follow up appointment in 6 months (September 2020).  Please call our office 2 months in advance to schedule this appointment.  You may see Peter Martinique, MD or one of the following Advanced Practice Providers on your designated Care Team: [place], [place] . Fabian Sharp, PA-C  Any Other Special Instructions Will Be Listed Below (If Applicable). YOU ARE CLEARED FOR SURGERY  RESTART ASPIRIN AFTER SURGERY AS SOON AS POSSIBLE  AT THE DISCRETION OF THE SURGEON       Signed, Almyra Deforest, [place]  06/30/2018 8:57 AM    [facility] [place], [place], [place]  [address] Phone: [phone]; Fax: [phone]

## 2018-06-30 NOTE — Patient Instructions (Addendum)
Medication Instructions:  Your physician recommends that you continue on your current medications as directed. Please refer to the Current Medication list given to you today.  If you need a refill on your cardiac medications before your next appointment, please call your pharmacy.   Lab work: NONE If you have labs (blood work) drawn today and your tests are completely normal, you will receive your results only by: Marland Kitchen MyChart Message (if you have MyChart) OR . A paper copy in the mail If you have any lab test that is abnormal or we need to change your treatment, we will call you to review the results.  Testing/Procedures: You need to schedule an ECHOCARDIOGRAM as soon as you are recovered from your back surgery within the next 6 months   Your physician has requested that you have an echocardiogram. Echocardiography is a painless test that uses sound waves to create images of your heart. It provides your doctor with information about the size and shape of your heart and how well your heart's chambers and valves are working. This procedure takes approximately one hour. There are no restrictions for this procedure.   Follow-Up: At [facility], you and your health needs are our priority.  As part of our continuing mission to provide you with exceptional heart care, we have created designated Provider Care Teams.  These Care Teams include your primary Cardiologist (physician) and Advanced Practice Providers (APPs -  Physician Assistants and Nurse Practitioners) who all work together to provide you with the care you need, when you need it. You will need a follow up appointment in 6 months (September 2020).  Please call our office 2 months in advance to schedule this appointment.  You may see Peter Martinique, MD or one of the following Advanced Practice Providers on your designated Care Team: [place], [place] . Fabian Sharp, PA-C  Any Other Special Instructions Will Be Listed Below (If Applicable). YOU  ARE CLEARED FOR SURGERY  RESTART ASPIRIN AFTER SURGERY AS SOON AS POSSIBLE AT THE DISCRETION OF THE SURGEON

## 2018-06-30 NOTE — Brief Op Note (Signed)
06/30/2018  6:34 PM  PATIENT:  [name]  58 y.o. male  PRE-OPERATIVE DIAGNOSIS:  Lumbar radiculopathy  POST-OPERATIVE DIAGNOSIS:  Lumbar radiculopathy  PROCEDURE:  Procedure(s) with comments: Right minimally invasive Lumbar Three-Four Far lateral discectomy (Right) - Right minimally invasive Lumbar Three-Four Far lateral discectomy  SURGEON:  Surgeon(s) and Role:    * Tahsin Benyo, Joyice Faster, MD - Primary    * Consuella Lose, MD - Assisting  PHYSICIAN ASSISTANT:   ANESTHESIA:   general  EBL:  20 mL   BLOOD ADMINISTERED:none  DRAINS: none   LOCAL MEDICATIONS USED:  LIDOCAINE   SPECIMEN:  No Specimen  DISPOSITION OF SPECIMEN:  N/A  COUNTS:  YES  TOURNIQUET:  * No tourniquets in log *  DICTATION: .Note written in EPIC  PLAN OF CARE: Discharge to home after PACU  PATIENT DISPOSITION:  PACU - hemodynamically stable.   Delay start of Pharmacological VTE agent (>24hrs) due to surgical blood loss or risk of bleeding: yes

## 2018-06-30 NOTE — Anesthesia Procedure Notes (Signed)
Procedure Name: Intubation Date/Time: 06/30/2018 3:34 PM Performed by: Julieta Bellini, CRNA Pre-anesthesia Checklist: Patient identified, Emergency Drugs available, Suction available and Patient being monitored Patient Re-evaluated:Patient Re-evaluated prior to induction Oxygen Delivery Method: Circle system utilized Preoxygenation: Pre-oxygenation with 100% oxygen Induction Type: IV induction Ventilation: Mask ventilation without difficulty Laryngoscope Size: Mac and 4 Tube type: Oral Tube size: 7.5 mm Number of attempts: 1 Airway Equipment and Method: Stylet and Oral airway Placement Confirmation: ETT inserted through vocal cords under direct vision,  positive ETCO2 and breath sounds checked- equal and bilateral Secured at: 23 cm Tube secured with: Tape Dental Injury: Teeth and Oropharynx as per pre-operative assessment

## 2018-06-30 NOTE — Transfer of Care (Signed)
Immediate Anesthesia Transfer of Care Note  Patient: [name]  Procedure(s) Performed: Right minimally invasive Lumbar Three-Four Far lateral discectomy (Right )  Patient Location: PACU  Anesthesia Type:General  Level of Consciousness: drowsy and patient cooperative  Airway & Oxygen Therapy: Patient Spontanous Breathing and Patient connected to face mask oxygen  Post-op Assessment: Report given to RN and Post -op Vital signs reviewed and stable  Post vital signs: Reviewed and stable  Last Vitals:  Vitals Value Taken Time  BP 130/85 06/30/2018  6:52 PM  Temp    Pulse 66 06/30/2018  6:52 PM  Resp 8 06/30/2018  6:52 PM  SpO2 100 % 06/30/2018  6:52 PM  Vitals shown include unvalidated device data.  Last Pain:  Vitals:   06/30/18 1209  TempSrc:   PainSc: 10-Worst pain ever      Patients Stated Pain Goal: 3 (46/56/81 2751)  Complications: No apparent anesthesia complications

## 2018-06-30 NOTE — Discharge Instructions (Addendum)
Discharge Instructions ° °No restriction in activities, slowly increase your activity back to normal.  ° °Your incision is closed with dermabond (purple glue). This will naturally fall off over the next 1-2 weeks.  ° °Okay to shower on the day of discharge. Use regular soap and water and try to be gentle when cleaning your incision.  ° °Follow up with Dr. Ebonie Westerlund in 2 weeks after discharge. If you do not already have a discharge appointment, please call his office at [phone] to schedule a follow up appointment. If you have any concerns or questions, please call the office and let us know. °

## 2018-06-30 NOTE — Op Note (Signed)
PATIENT: [name]  DAY OF SURGERY: 06/30/18   PRE-OPERATIVE DIAGNOSIS:  Lumbar radiculopathy   POST-OPERATIVE DIAGNOSIS:  Lumbar radiculopathy   PROCEDURE:  Minimally invasive right L3-4 far lateral discectomy   SURGEON:  Surgeon(s) and Role:    Judith Part, MD - Primary   ANESTHESIA: ETGA   BRIEF HISTORY: This is a 58 year old man who presented with symptoms of a right L3 radiculopathy that was not responsive to non-surgical treatments. His MRI showed a large far lateral disc herniation at that level. I therefore recommended a minimally invasive L3-4 far lateral discectomy. This was discussed with the patient as well as risks, benefits, and alternatives and the patient wished to proceed with surgical treatment.   OPERATIVE DETAIL: The patient was taken to the operating room and placed on the OR table in the prone position. A formal time out was performed with two patient identifiers and confirmed the operative site. Anesthesia was induced by the anesthesia team. The operative site was marked, hair was clipped with surgical clippers, the area was then prepped and draped in a sterile fashion. Fluoroscopy was used to identify the surgical level with a spinal needle. A 2cm incision was then marked 3.5cm off to the right of midline. The fascia was incised sharply and serial dilators were docked to the interface of the transverse process of L4 and the pars of L3. A final tubular distractor was placed and secured to the table and fluoro again confirmed the correct surgical level. AP and lateral views were used to guide the tube. Landmarks were confirmed and the soft tissue was removed to expose the right L4 TP. A small lateral facetectomy was performed to increase access to the foramen and disc space. A nerve root retractor was used to retract the soft tissues and nerve root superiorly. A large disc herniation was identified. The annulus was incised and a very large fragment immediately  herniated out. Insertion of a nerve root retractor porduced another sizable fragment. All free fragments were removed, hemostasis was obtained, decompression of the nerve root was confirmed, the wound was copiously irrigated, and the tube was removed while using the microscope to confirm hemostasis of the muscle edges. All instrument and sponge counts were correct and the incision was then closed in layers. The patient was then returned to anesthesia for emergence. No apparent complications at the completion of the procedure.   EBL:  50mL   DRAINS: none   SPECIMENS: none   Judith Part, MD 06/30/18 6:35 PM

## 2018-07-01 ENCOUNTER — Encounter (HOSPITAL_COMMUNITY): Payer: Self-pay | Admitting: Neurological Surgery

## 2018-07-07 ENCOUNTER — Other Ambulatory Visit: Payer: Self-pay | Admitting: Cardiology

## 2018-07-07 ENCOUNTER — Ambulatory Visit: Payer: Medicare HMO | Admitting: Adult Health

## 2018-07-07 DIAGNOSIS — I10 Essential (primary) hypertension: Secondary | ICD-10-CM

## 2018-07-09 ENCOUNTER — Other Ambulatory Visit: Payer: Self-pay | Admitting: Internal Medicine

## 2018-07-09 DIAGNOSIS — I25810 Atherosclerosis of coronary artery bypass graft(s) without angina pectoris: Secondary | ICD-10-CM

## 2018-07-09 DIAGNOSIS — E785 Hyperlipidemia, unspecified: Secondary | ICD-10-CM

## 2018-07-09 DIAGNOSIS — I10 Essential (primary) hypertension: Secondary | ICD-10-CM

## 2018-07-16 DIAGNOSIS — M4726 Other spondylosis with radiculopathy, lumbar region: Secondary | ICD-10-CM | POA: Diagnosis not present

## 2018-10-11 ENCOUNTER — Other Ambulatory Visit: Payer: Self-pay | Admitting: Internal Medicine

## 2018-10-11 DIAGNOSIS — I25810 Atherosclerosis of coronary artery bypass graft(s) without angina pectoris: Secondary | ICD-10-CM

## 2018-10-11 DIAGNOSIS — I10 Essential (primary) hypertension: Secondary | ICD-10-CM

## 2018-11-03 ENCOUNTER — Telehealth: Payer: Self-pay | Admitting: Internal Medicine

## 2018-11-03 NOTE — Telephone Encounter (Signed)
Pt may need an appointment. Pt last seen in the office was 06/04/18 by Dr. Leanne Chang. Pt last seen Dr. Wynetta Emery on 12/30/17  Please schedule pt an in person morning appointment with Dr. Wynetta Emery

## 2018-11-03 NOTE — Telephone Encounter (Signed)
Pt called he needs his disability reinstated.. he needs proof of current health situation, for social security..he states he's having sob, back pain due to surgery, and eyesight is deteriorating...please follow up

## 2018-11-07 ENCOUNTER — Other Ambulatory Visit: Payer: Self-pay

## 2018-11-07 ENCOUNTER — Emergency Department (HOSPITAL_COMMUNITY): Payer: Medicaid Other

## 2018-11-07 ENCOUNTER — Emergency Department (HOSPITAL_COMMUNITY)
Admission: EM | Admit: 2018-11-07 | Discharge: 2018-11-07 | Disposition: A | Discharge status: Home / Self Care | Payer: Medicaid Other | Attending: Emergency Medicine | Admitting: Emergency Medicine

## 2018-11-07 ENCOUNTER — Encounter (HOSPITAL_COMMUNITY): Payer: Self-pay

## 2018-11-07 DIAGNOSIS — Z79899 Other long term (current) drug therapy: Secondary | ICD-10-CM | POA: Diagnosis not present

## 2018-11-07 DIAGNOSIS — Z951 Presence of aortocoronary bypass graft: Secondary | ICD-10-CM | POA: Diagnosis not present

## 2018-11-07 DIAGNOSIS — I2510 Atherosclerotic heart disease of native coronary artery without angina pectoris: Secondary | ICD-10-CM | POA: Insufficient documentation

## 2018-11-07 DIAGNOSIS — Z9889 Other specified postprocedural states: Secondary | ICD-10-CM | POA: Diagnosis not present

## 2018-11-07 DIAGNOSIS — F17210 Nicotine dependence, cigarettes, uncomplicated: Secondary | ICD-10-CM | POA: Insufficient documentation

## 2018-11-07 DIAGNOSIS — I10 Essential (primary) hypertension: Secondary | ICD-10-CM | POA: Diagnosis not present

## 2018-11-07 DIAGNOSIS — G8929 Other chronic pain: Secondary | ICD-10-CM

## 2018-11-07 DIAGNOSIS — M5441 Lumbago with sciatica, right side: Secondary | ICD-10-CM | POA: Diagnosis not present

## 2018-11-07 DIAGNOSIS — Z8673 Personal history of transient ischemic attack (TIA), and cerebral infarction without residual deficits: Secondary | ICD-10-CM | POA: Diagnosis not present

## 2018-11-07 DIAGNOSIS — M545 Low back pain: Secondary | ICD-10-CM | POA: Diagnosis present

## 2018-11-07 DIAGNOSIS — Z7982 Long term (current) use of aspirin: Secondary | ICD-10-CM | POA: Diagnosis not present

## 2018-11-07 MED ORDER — PREDNISONE 10 MG PO TABS: ORAL_TABLET | ORAL | 0 refills | Status: AC

## 2018-11-07 MED ORDER — KETOROLAC TROMETHAMINE 15 MG/ML IJ SOLN
15.0000 mg | Freq: Once | INTRAMUSCULAR | Status: AC
  Administered 2018-11-07: 15 mg via INTRAMUSCULAR
  Filled 2018-11-07: qty 1

## 2018-11-07 MED ORDER — METHOCARBAMOL 500 MG PO TABS: 500.0000 mg | ORAL_TABLET | Freq: Two times a day (BID) | ORAL | 0 refills | Status: DC

## 2018-11-07 MED ORDER — LIDOCAINE 5 % EX PTCH: 1.0000 | MEDICATED_PATCH | CUTANEOUS | 0 refills | Status: DC

## 2018-11-07 MED ORDER — LIDOCAINE 5 % EX PTCH
1.0000 | MEDICATED_PATCH | CUTANEOUS | Status: DC
  Administered 2018-11-07: 1 via TRANSDERMAL
  Filled 2018-11-07: qty 1

## 2018-11-07 MED ORDER — METHOCARBAMOL 500 MG PO TABS
500.0000 mg | ORAL_TABLET | Freq: Once | ORAL | Status: AC
  Administered 2018-11-07: 500 mg via ORAL
  Filled 2018-11-07: qty 1

## 2018-11-07 NOTE — ED Provider Notes (Signed)
[facility] EMERGENCY DEPARTMENT Provider Note   CSN: [id number] Arrival date & time: 11/07/18  1019    History   Chief Complaint Chief Complaint  Patient presents with  . Back Pain    HPI [name] is a 58 y.o. male with history significant for CAD, CVA, Dressler syndrome, myopericarditis, obesity, hypertension, hyperlipidemia presents today for right lower back pain with sciatica.  Patient reports history of chronic lower back pain he underwent lumbar laminectomy with decompression in March 2020.  Patient reports that since his surgery 4 months ago he has not had any relief of his pain.  He reports consistent pain of the right lower back with right-sided sciatica that has not changed in nature or severity.  Patient reports that he has been attempting to deal with his pain at home however he is tired of his uncontrolled pain he describes a severe sharp pain to his right lower back that occasionally will shoot down his right leg slightly worsened with leg elevation and without alleviating factors.  Patient reports that his right leg will occasionally experience paresthesias.  He denies fevers/chills, headache, neck pain, chest pain/shortness of breath, abdominal pain, dysuria/hematuria, saddle area paresthesias, bowel/bladder incontinence, urinary retention, IV drug use, cancer, injury/trauma or any additional concerns.    HPI  Past Medical History:  Diagnosis Date  . Abnormal EKG    hx of ischemia showing up on ekg's  . Acute myopericarditis    a. 03/2013: readm with hypoxia, tachycardia, elevated ESR/CRP, elevated troponin with 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and myopericarditis; treated with steroids.  . Acute respiratory failure ([place])    a. 01/2013: VDRF. b. 03/2013: hypoxia requiring supp O2 during adm, resolved by discharge.  . C. difficile colitis    a. 01/2013 during prolonged adm. b. Recurred 03/2013.  . Cardiac tamponade    a. 01/2013 s/p drain.  .  Coronary artery disease    a. s/p MI in 2009 in [place] with stenting of the LCX and LAD;  b. 12/2012 NSTEMI/CAD: LM nl, LAD patent prox stent, LCX 50-70 isr (FFR 0.84), RCA dom, 156m EF 40-45%-->Med Rx. c. 01/2013: anterolateral STEMI complicated by pericardial effusion (presumed purulent pericarditis) and tamponade s/p drain, ruptured LV pseudoaneurysm s/p CorMatrix patch, CABGx1 (SVG-OM1), fever, CVA, VDRF, C diff.  . CVA (cerebral infarction)    a. 01/2013 in setting of prolonged hospitalization - residual L arm weakness.  . 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    a. 03/2013: readm with hypoxia, tachycardia, elevated ESR/CRP, elevated troponin with 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and myopericarditis; treated with steroids.  . Hyperlipidemia   . Hypokalemia   . Hyponatremia    a. During late 2014.  . Ischemic cardiomyopathy    a. Sept/Oct 2014: EF ~40% (ICM). b. 03/2013: EF 25-30%. Off ACEI due to hypotension (MIXED NICM/ICM).  .[place]  . Pericardial effusion    a. 01/2013: pericardial effusion (presumed purulent pericarditis) and cardiac tamponade s/p drain. b. Persistent moderate pericardial effusion 03/2013.  . Pre-diabetes   . Pseudoaneurysm of left ventricle of heart    a. 01/2013:  Ruptured inferoposterior LV pseudoaneurysm s/p CorMatrix patch.  . Sinus bradycardia    asymptomatic  . Stroke ([facility] 2009   weak lt side-lt arm  . Tobacco abuse    a. quit 12/2012.  . Wears dentures    top    Patient Active Problem List   Diagnosis Date Noted  . Lumbar herniated disc 06/04/2018  . Prediabetes 01/01/2018  . Hyperlipidemia 12/30/2017  .  Obesity (BMI 30.0-34.9) 12/30/2017  . Benign neoplasm of descending colon   . Essential hypertension 11/03/2015  . Cardiomyopathy, ischemic 06/03/2015  . Chronic systolic dysfunction of left ventricle 06/03/2015  . Erectile dysfunction due to arterial insufficiency 12/23/2014  . Lipoma of skin and subcutaneous tissue of neck 05/24/2014  . History of  cardioembolic stroke 30/10/6224  . Vision loss of right eye 06/23/2013  . Dyslipidemia 06/22/2013  . 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([place])   . Coronary artery disease   . Cardiomyopathy ([place])   . Pseudoaneurysm of left ventricle of heart   . Tobacco abuse     Past Surgical History:  Procedure Laterality Date  . CARDIAC CATHETERIZATION  01/06/2013  . COLONOSCOPY WITH PROPOFOL N/A 07/03/2016   Procedure: COLONOSCOPY WITH PROPOFOL;  Surgeon: Manus Gunning, MD;  Location: [facility];  Service: Gastroenterology;  Laterality: N/A;  . CORONARY ANGIOPLASTY WITH STENT PLACEMENT  2000's X 2   "1 + 1" (01/06/2013)  . CORONARY ARTERY BYPASS GRAFT N/A 01/28/2013   Procedure: CORONARY ARTERY BYPASS GRAFTING (CABG);  Surgeon: Melrose Nakayama, MD;  Location: [place] [facility];  Service: Open Heart Surgery;  Laterality: N/A;  CABG x one, using left greater saphenous vein harvested endoscopically  . INTRAOPERATIVE TRANSESOPHAGEAL ECHOCARDIOGRAM N/A 01/28/2013   Procedure: INTRAOPERATIVE TRANSESOPHAGEAL ECHOCARDIOGRAM;  Surgeon: Melrose Nakayama, MD;  Location: [place];  Service: Open Heart Surgery;  Laterality: N/A;  . LEFT HEART CATHETERIZATION WITH CORONARY ANGIOGRAM N/A 01/06/2013   Procedure: LEFT HEART CATHETERIZATION WITH CORONARY ANGIOGRAM;  Surgeon: Peter M Martinique, MD;  Location: [facility] C[facility] LAB;  Service: Cardiovascular;  Laterality: N/A;  . LEFT HEART CATHETERIZATION WITH CORONARY ANGIOGRAM N/A 01/21/2013   Procedure: LEFT HEART CATHETERIZATION WITH CORONARY ANGIOGRAM;  Surgeon: Burnell Blanks, MD;  Location: [facility] C[facility] LAB;  Service: Cardiovascular;  Laterality: N/A;  . LUMBAR LAMINECTOMY/ DECOMPRESSION WITH MET-RX Right 06/30/2018   Procedure: Right minimally invasive Lumbar Three-Four Far lateral discectomy;  Surgeon: Judith Part, MD;  Location: [facility];  Service: Neurosurgery;  Laterality: Right;  Right minimally invasive Lumbar Three-Four Far lateral discectomy  . MASS EXCISION Right 07/21/2014    Procedure: EXCISION RIGHT NECK MASS;  Surgeon: Erroll Luna, MD;  Location: [facility] [place];  Service: General;  Laterality: Right;  . PERICARDIAL TAP N/A 01/24/2013   Procedure: PERICARDIAL TAP;  Surgeon: Blane Ohara, MD;  Location: [facility] CATH LAB;  Service: Cardiovascular;  Laterality: N/A;  . RIGHT HEART CATHETERIZATION Right 01/24/2013   Procedure: RIGHT HEART CATH;  Surgeon: Blane Ohara, MD;  Location: [facility] CATH LAB;  Service: Cardiovascular;  Laterality: Right;  . VENTRICULAR ANEURYSM RESECTION N/A 01/28/2013   Procedure: LEFT VENTRICULAR ANEURYSM REPAIR;  Surgeon: Melrose Nakayama, MD;  Location: [facility];  Service: Open Heart Surgery;  Laterality: N/A;        Home Medications    Prior to Admission medications   Medication Sig Start Date End Date Taking? Authorizing Provider  ALPHAGAN P 0.1 % SOLN Place 1 drop into both eyes 2 (two) times daily. 01/15/18   [provider]  aspirin 81 MG EC tablet Take 1 tablet (81 mg total) by mouth daily. Restart on 07/05/2018 06/30/18   Judith Part, MD  atorvastatin (LIPITOR) 80 MG tablet TAKE 1 TABLET BY MOUTH EVERY DAY 07/09/18   Ladell Pier, MD  carvedilol (COREG) 25 MG tablet TAKE 1 TABLET (25 MG TOTAL) BY MOUTH 2 (TWO) TIMES DAILY WITH A MEAL. NEED OV. 07/07/18   Martinique, Peter  M, MD  Cholecalciferol (VITAMIN D) 2000 units tablet Take 2,000 Units by mouth daily.    [provider]  diazepam (VALIUM) 5 MG tablet Take 1 tablet (5 mg total) by mouth every 8 (eight) hours as needed for muscle spasms. 06/30/18 06/30/19  Judith Part, MD  latanoprost (XALATAN) 0.005 % ophthalmic solution Place 1 drop into both eyes daily. 05/31/16   [provider]  lidocaine (LIDODERM) 5 % Place 1 patch onto the skin daily. Remove & Discard patch within 12 hours or as directed by MD 11/07/18   Nuala Alpha A, PA-C  lisinopril (PRINIVIL,ZESTRIL) 10 MG tablet TAKE 1 TABLET BY MOUTH TWICE A DAY 07/09/18    Ladell Pier, MD  methocarbamol (ROBAXIN) 500 MG tablet Take 1 tablet (500 mg total) by mouth 2 (two) times daily. 11/07/18   Nuala Alpha A, PA-C  Multiple Vitamin (MULTIVITAMIN WITH MINERALS) TABS tablet Take 1 tablet by mouth daily.    [provider]  nitroGLYCERIN (NITROSTAT) 0.4 MG SL tablet Place 1 tablet (0.4 mg total) under the tongue every 5 (five) minutes as needed for chest pain (up to 3 doses). 04/17/13   Darlin Coco, MD  oxyCODONE (ROXICODONE) 5 MG immediate release tablet Take 1 tablet (5 mg total) by mouth every 4 (four) hours as needed (pain). 06/30/18   Judith Part, MD  predniSONE (DELTASONE) 10 MG tablet Take 5 tablets (50 mg total) by mouth daily for 1 day, THEN 4 tablets (40 mg total) daily for 1 day, THEN 3 tablets (30 mg total) daily for 1 day, THEN 2 tablets (20 mg total) daily for 1 day, THEN 1 tablet (10 mg total) daily for 1 day. 11/07/18 11/12/18  Nuala Alpha A, PA-C  sildenafil (VIAGRA) 100 MG tablet Take 1 tablet (100 mg total) by mouth daily as needed for erectile dysfunction. 02/06/17   Martinique, Peter M, MD    Family History Family History  Problem Relation Age of Onset  . Heart attack Mother   . Diabetes Mother   . Hypertension Mother   . Hypertension Father   . CAD Other   . HIV Brother   . Stroke Neg Hx     Social History Social History   Tobacco Use  . Smoking status: Current Every Day Smoker    Packs/day: 1.50    Years: 35.00    Pack years: 52.50    Types: Cigarettes  . Smokeless tobacco: Never Used  Substance Use Topics  . Alcohol use: No    Alcohol/week: 0.0 standard drinks  . Drug use: No     Allergies   Patient has no known allergies.   Review of Systems Review of Systems Ten systems are reviewed and are negative for acute change except as noted in the HPI  Physical Exam Updated Vital Signs BP (!) 151/96 (BP Location: Left Arm)   Pulse 61   Temp 98.5 F (36.9 C) (Oral)   Resp 18   Ht 6' (1.829  m)   Wt 111.1 kg   SpO2 100%   BMI 33.23 kg/m   Physical Exam Constitutional:      General: He is not in acute distress.    Appearance: Normal appearance. He is well-developed. He is not ill-appearing or diaphoretic.  HENT:     Head: Normocephalic and atraumatic.     Jaw: There is normal jaw occlusion. No trismus.     Right Ear: External ear normal.     Left Ear: External ear  normal.     Nose: Nose normal.     Mouth/Throat:     Mouth: Mucous membranes are moist.     Pharynx: Oropharynx is clear.  Eyes:     General: Vision grossly intact. Gaze aligned appropriately.     Conjunctiva/sclera: Conjunctivae normal.     Pupils: Pupils are equal, round, and reactive to light.  Neck:     Musculoskeletal: Full passive range of motion without pain, normal range of motion and neck supple.     Trachea: Trachea and phonation normal. No tracheal deviation.  Cardiovascular:     Rate and Rhythm: Normal rate and regular rhythm.     Pulses:          Radial pulses are 2+ on the right side and 2+ on the left side.       Dorsalis pedis pulses are 2+ on the right side and 2+ on the left side.  Pulmonary:     Effort: Pulmonary effort is normal. No respiratory distress.  Abdominal:     General: There is no distension.     Palpations: Abdomen is soft. There is no pulsatile mass.     Tenderness: There is no abdominal tenderness. There is no guarding or rebound.  Musculoskeletal: Normal range of motion.       Back:     Comments: No midline C/T/L spinal tenderness to palpation, no deformity, crepitus, or step-off noted. No sign of injury to the neck or back. - Patient with tenderness overlying the right gluteal medius musculature without sign of injury.  Pain increased with right straight leg raise.  Feet:     Right foot:     Protective Sensation: 3 sites tested. 3 sites sensed.     Left foot:     Protective Sensation: 3 sites tested. 3 sites sensed.  Skin:    General: Skin is warm and dry.   Neurological:     Mental Status: He is alert.     GCS: GCS eye subscore is 4. GCS verbal subscore is 5. GCS motor subscore is 6.     Comments: Speech is clear and goal oriented, follows commands Major Cranial nerves without deficit, no facial droop Normal strength in upper and lower extremities bilaterally including dorsiflexion and plantar flexion, strong and equal grip strength Sensation normal to light and sharp touch Moves extremities without ataxia, coordination intact  Psychiatric:        Behavior: Behavior normal.    ED Treatments / Results  Labs (all labs ordered are listed, but only abnormal results are displayed) Labs Reviewed - No data to display  EKG None  Radiology Dg Lumbar Spine Complete  Result Date: 11/07/2018 CLINICAL DATA:  Fall with right lower extremity sciatica. History of prior lumbar discectomy. EXAM: LUMBAR SPINE - COMPLETE 4+ VIEW COMPARISON:  Lumbar MRI on 07/16/2018 FINDINGS: There is no evidence of acute fracture or subluxation. Disc space heights are fairly well maintained with suggestion of minimal disc space narrowing at L5-S1. No bony lesions identified. Calcified plaque present in the abdominal aorta and iliac arteries. IMPRESSION: 1. No acute findings. 2. Minimal disc space narrowing at L5-S1. 3. Aortoiliac atherosclerosis. Electronically Signed   By: Aletta Edouard M.D.   On: 11/07/2018 12:49    Procedures Procedures (including critical care time)  Medications Ordered in ED Medications  lidocaine (LIDODERM) 5 % 1 patch (1 patch Transdermal Patch Applied 11/07/18 1153)  ketorolac (TORADOL) 15 MG/ML injection 15 mg (15 mg Intramuscular Given 11/07/18 1159)  methocarbamol (ROBAXIN) tablet 500 mg (500 mg Oral Given 11/07/18 1152)     Initial Impression / Assessment and Plan / ED Course  I have reviewed the triage vital signs and the nursing notes.  Pertinent labs & imaging results that were available during my care of the patient were reviewed by  me and considered in my medical decision making (see chart for details).    [name] is a 58 y.o. male presenting with right-sided lower back pain with sciatica. Pain has been present for for multiple months and is described as their typical back pain. Patient denies history of trauma, fever, IV drug use, night sweats, weight loss, cancer, saddle anesthesia, urinary rentention, bowel/bladder incontinence. No neurological deficits and normal neuro exam.   Patient does report history of laminectomy and decompression approximately 4 months ago, well-healed without sign of infection, no history of fevers/chills.  Pain has remained constant despite his surgery.  Suspect musculoskeletal etiology of patient's pain. Pain is consistently reproducible with palpation of the back musculature as well as right straight leg raise. Abdomen soft/nontender and without pulsatile mass. Patient with equal pedal pulses. Doubt spinal epidural abscess, cauda equina or AAA.  Will obtain imaging of the lumbar spine due to patient concern.  DG Lumbar: IMPRESSION: 1. No acute findings. 2. Minimal disc space narrowing at L5-S1. 3. Aortoiliac atherosclerosis.   Toradol 21m given. Patient denies history of CKD or gastric ulcers/bleeding.  Robaxin 5082mBID prescribed. Patient informed to avoid driving or operating heavy machinery while taking muscle relaxer.  Lidoderm patch given, prescription given.  Prednisone taper prescribed, patient denies history of diabetes. - On reassessment patient is ambulating around the room with a steady gait dressing himself states that he is feeling improved and would like to be discharged.  At this time there does not appear to be any evidence of an acute emergency medical condition and the patient appears stable for discharge with appropriate outpatient follow up. Diagnosis was discussed with patient who verbalizes understanding of care plan and is agreeable to discharge. I have  discussed return precautions with patient who verbalizes understanding of return precautions. Patient encouraged to follow-up with their PCP and his neurosurgeon. All questions answered.  Patient has been discharged in good condition.  Patient's case discussed with Dr. KoWilson Singerho agrees with plan to discharge with follow-up.   Note: Portions of this report may have been transcribed using voice recognition software. Every effort was made to ensure accuracy; however, inadvertent computerized transcription errors may still be present. Final Clinical Impressions(s) / ED Diagnoses   Final diagnoses:  Chronic right-sided low back pain with right-sided sciatica    ED Discharge Orders         Ordered    lidocaine (LIDODERM) 5 %  Every 24 hours     11/07/18 1535    methocarbamol (ROBAXIN) 500 MG tablet  2 times daily     11/07/18 1535    predniSONE (DELTASONE) 10 MG tablet     11/07/18 [address]7/24/20 1539    KoVirgel ManifoldMD 11/08/18 [id number]

## 2018-11-07 NOTE — ED Notes (Signed)
Patient verbalizes understanding of discharge instructions. Opportunity for questioning and answers were provided. Armband removed by staff, pt discharged from ED.  

## 2018-11-07 NOTE — ED Triage Notes (Signed)
Pt endorses lower back pain radiating down the right leg. Has hx of back surgery. Denies incontinence. Axox4, ambulatory.

## 2018-11-07 NOTE — Discharge Instructions (Addendum)
You have been diagnosed today with chronic right lower back pain with sciatica of the right side.  At this time there does not appear to be the presence of an emergent medical condition, however there is always the potential for conditions to change. Please read and follow the below instructions.  Please return to the Emergency Department immediately for any new or worsening symptoms. Please be sure to follow up with your Primary Care Provider within one week regarding your visit today; please call their office to schedule an appointment even if you are feeling better for a follow-up visit. You have been given an NSAID-containing medication called Toradol today.  Do not take the medications including ibuprofen, Aleve, Advil, naproxen or other NSAID-containing medications for the next 2 days.  Please be sure to drink plenty of water over the next few days. You may use the muscle relaxer Robaxin as prescribed to help with your symptoms.  Do not drive or operate heavy machinery while taking Robaxin as it will make you drowsy.  Do not drink alcohol or take other sedating medications while taking Robaxin as this will worsen side effects. Please use the prednisone taper as prescribed to help with your right-sided sciatica-like symptoms. You may use the Lidoderm patches as prescribed to help with your symptoms. Please call your surgeon Dr. Venetia Constable to schedule a follow-up appointment regarding your chronic back pain.  Below is the impression of your lumbar spine x-ray today you may discuss this with your primary care doctor or surgeon.  Lumbar Spine IMPRESSION: 1. No acute findings. 2. Minimal disc space narrowing at L5-S1. 3. Aortoiliac atherosclerosis.   Get help right away if: One or both of your arms or legs feel weak. One or both of your arms or legs lose feeling (numbness). You have trouble controlling when you poop (bowel movement) or pee (urinate). You feel sick to your stomach (nauseous). You  throw up (vomit). You have belly (abdominal) pain. You have shortness of breath. You pass out (faint). You cannot control when you pee (urinate) or poop (have a bowel movement). You have redness or swelling of your back. You have a burning feeling when you pee. Your pain feels different from your chronic back pain Any new/concerning or worsening symptoms  Please read the additional information packets attached to your discharge summary.  Do not take your medicine if  develop an itchy rash, swelling in your mouth or lips, or difficulty breathing; call 911 and seek immediate emergency medical attention if this occurs.

## 2018-11-14 ENCOUNTER — Telehealth: Payer: Self-pay | Admitting: Cardiology

## 2018-11-14 NOTE — Telephone Encounter (Signed)
LVM for patient to call and schedule followup with Dr. Martinique.

## 2018-12-04 ENCOUNTER — Other Ambulatory Visit: Payer: Self-pay

## 2018-12-04 ENCOUNTER — Ambulatory Visit: Payer: Self-pay | Attending: Internal Medicine | Admitting: Internal Medicine

## 2018-12-04 ENCOUNTER — Encounter: Payer: Self-pay | Admitting: Internal Medicine

## 2018-12-04 VITALS — BP 138/90 | HR 64 | Temp 98.3°F | Resp 16 | Ht 72.0 in | Wt 237.4 lb

## 2018-12-04 DIAGNOSIS — Z0289 Encounter for other administrative examinations: Secondary | ICD-10-CM

## 2018-12-04 DIAGNOSIS — I10 Essential (primary) hypertension: Secondary | ICD-10-CM

## 2018-12-04 DIAGNOSIS — M5416 Radiculopathy, lumbar region: Secondary | ICD-10-CM

## 2018-12-04 DIAGNOSIS — I25810 Atherosclerosis of coronary artery bypass graft(s) without angina pectoris: Secondary | ICD-10-CM

## 2018-12-04 DIAGNOSIS — H25013 Cortical age-related cataract, bilateral: Secondary | ICD-10-CM

## 2018-12-04 DIAGNOSIS — H547 Unspecified visual loss: Secondary | ICD-10-CM

## 2018-12-04 DIAGNOSIS — E785 Hyperlipidemia, unspecified: Secondary | ICD-10-CM

## 2018-12-04 DIAGNOSIS — H409 Unspecified glaucoma: Secondary | ICD-10-CM

## 2018-12-04 MED ORDER — LISINOPRIL 10 MG PO TABS: 10.0000 mg | ORAL_TABLET | Freq: Two times a day (BID) | ORAL | 3 refills | Status: DC

## 2018-12-04 MED ORDER — TRAMADOL HCL 50 MG PO TABS: 50.0000 mg | ORAL_TABLET | Freq: Four times a day (QID) | ORAL | 0 refills | Status: DC | PRN

## 2018-12-04 MED ORDER — CARVEDILOL 25 MG PO TABS: 25.0000 mg | ORAL_TABLET | Freq: Two times a day (BID) | ORAL | 5 refills | Status: DC

## 2018-12-04 MED ORDER — GABAPENTIN 300 MG PO CAPS: ORAL_CAPSULE | ORAL | 3 refills | Status: DC

## 2018-12-04 MED ORDER — ATORVASTATIN CALCIUM 80 MG PO TABS: 80.0000 mg | ORAL_TABLET | Freq: Every day | ORAL | 3 refills | Status: DC

## 2018-12-04 NOTE — Progress Notes (Signed)
Patient ID: [name], male    DOB: 1960/04/22  MRN: [id number]  CC: Letter for School/Work (disability)   Subjective: [name] is a 58 y.o. male who presents to request a letter in support of getting his disability benefits reinstated.   His concerns today include:  Patient with history of CAD, ICM, HL, HTN, CVA, tob dep, obesity, ruptured left ventricular pseudoaneurysm requiring emergency surgery, CABG x1. Last seen by me 12/2017  Pt had lumbar radiculopathy due to slip disc and underwent RT L3-4 discectomy 06/30/2018 by Dr. Zada Finders -told that after surgery the pain in lower back and numbness/pain in RT leg would resolve.  Patient reports that this has not been the case.  Pain in the lower back and the numbness/pain in the right leg has persisted.  He was able to see the surgeon one time in follow-up and plan was to try some physical therapy but he subsequently lost coverage and was not able to do it. -fell about 10 times since surgery and seen in ER for pain 11/07/2018 -trying to get disability reinstated.  Had disability for 2 yrs; discontinue 06/2018 - he was told he is able to work. Wants to work.  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  Went back to work in April or May after back surgery but was able to work only 2 days due to pain.   Can stand only for short periods, hurts even to sit. Pain constant. Weakness and numbness RT leg and lower back.  No incont of bowel, occasional incontinence of urine.  Can not get comfortable at nights Using ASA and Advil.  Takes ASA once a day. Takes Advil 3-4 x a wk.   The other thing that is limited his ability to work is poor vision.  He was seeing Dr. Schuyler Amor for glaucoma, cataract and ischemic optic neuropathy BL.  He last saw him about 1-1/2 years ago.  Can not see well enough to drive.  Friends and family drive him around.    CAD/HTN:  Out of all meds x 3 wks due to lack of finances.  Patient Active  Problem List   Diagnosis Date Noted  . Lumbar herniated disc 06/04/2018  . Prediabetes 01/01/2018  . Hyperlipidemia 12/30/2017  . Obesity (BMI 30.0-34.9) 12/30/2017  . Benign neoplasm of descending colon   . Essential hypertension 11/03/2015  . Cardiomyopathy, ischemic 06/03/2015  . Chronic systolic dysfunction of left ventricle 06/03/2015  . Erectile dysfunction due to arterial insufficiency 12/23/2014  . Lipoma of skin and subcutaneous tissue of neck 05/24/2014  . History of cardioembolic stroke 71/16/5790  . Vision loss of right eye 06/23/2013  . Dyslipidemia 06/22/2013  . 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 Coronary artery disease   . Cardiomyopathy ([place])   . Pseudoaneurysm of left ventricle of heart   . Tobacco abuse      Current Outpatient Medications on File Prior to Visit  Medication Sig Dispense Refill  . ALPHAGAN P 0.1 % SOLN Place 1 drop into both eyes 2 (two) times daily.    Marland Kitchen aspirin 81 MG EC tablet Take 1 tablet (81 mg total) by mouth daily. Restart on 07/05/2018 90 tablet 3  . Cholecalciferol (VITAMIN D) 2000 units tablet Take 2,000 Units by mouth daily.    . diazepam (VALIUM) 5 MG tablet Take 1 tablet (5 mg total) by mouth every 8 (eight) hours as needed for muscle spasms. 30 tablet 0  .  latanoprost (XALATAN) 0.005 % ophthalmic solution Place 1 drop into both eyes daily.  12  . lidocaine (LIDODERM) 5 % Place 1 patch onto the skin daily. Remove & Discard patch within 12 hours or as directed by MD 30 patch 0  . methocarbamol (ROBAXIN) 500 MG tablet Take 1 tablet (500 mg total) by mouth 2 (two) times daily. 14 tablet 0  . Multiple Vitamin (MULTIVITAMIN WITH MINERALS) TABS tablet Take 1 tablet by mouth daily.    . nitroGLYCERIN (NITROSTAT) 0.4 MG SL tablet Place 1 tablet (0.4 mg total) under the tongue every 5 (five) minutes as needed for chest pain (up to 3 doses). 25 tablet 0  . sildenafil (VIAGRA) 100 MG tablet Take 1 tablet (100 mg total) by mouth daily as needed for erectile  dysfunction. 10 tablet 6   No current facility-administered medications on file prior to visit.     No Known Allergies  Social History   Socioeconomic History  . Marital status: Single    Spouse name: Not on file  . Number of children: 2  . Years of education: 12th  . Highest education level: Not on file  Occupational History  . Occupation: N/A  Social Needs  . Financial resource strain: Not on file  . Food insecurity    Worry: Not on file    Inability: Not on file  . Transportation needs    Medical: Not on file    Non-medical: Not on file  Tobacco Use  . Smoking status: Current Every Day Smoker    Packs/day: 1.50    Years: 35.00    Pack years: 52.50    Types: Cigarettes  . Smokeless tobacco: Never Used  Substance and Sexual Activity  . Alcohol use: No    Alcohol/week: 0.0 standard drinks  . Drug use: No  . Sexual activity: Yes  Lifestyle  . Physical activity    Days per week: Not on file    Minutes per session: Not on file  . Stress: Not on file  Relationships  . Social Herbalist on phone: Not on file    Gets together: Not on file    Attends religious service: Not on file    Active member of club or organization: Not on file    Attends meetings of clubs or organizations: Not on file    Relationship status: Not on file  . Intimate partner violence    Fear of current or ex partner: Not on file    Emotionally abused: Not on file    Physically abused: Not on file    Forced sexual activity: Not on file  Other Topics Concern  . Not on file  Social History Narrative   [place] in [place].  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  Quit smoking after MI 12/2012.   Caffeine Use: very rarely    Family History  Problem Relation Age of Onset  . Heart attack Mother   . Diabetes Mother   . Hypertension Mother   . Hypertension Father   . CAD Other   . HIV Brother   . Stroke Neg Hx     Past Surgical History:  Procedure Laterality Date  .  CARDIAC CATHETERIZATION  01/06/2013  . COLONOSCOPY WITH PROPOFOL N/A 07/03/2016   Procedure: COLONOSCOPY WITH PROPOFOL;  Surgeon: Manus Gunning, MD;  Location: [facility];  Service: Gastroenterology;  Laterality: N/A;  . CORONARY ANGIOPLASTY WITH STENT PLACEMENT  2000's X 2   "  1 + 1" (01/06/2013)  . CORONARY ARTERY BYPASS GRAFT N/A 01/28/2013   Procedure: CORONARY ARTERY BYPASS GRAFTING (CABG);  Surgeon: Melrose Nakayama, MD;  Location: [place];  Service: Open Heart Surgery;  Laterality: N/A;  CABG x one, using left greater saphenous vein harvested endoscopically  . INTRAOPERATIVE TRANSESOPHAGEAL ECHOCARDIOGRAM N/A 01/28/2013   Procedure: INTRAOPERATIVE TRANSESOPHAGEAL ECHOCARDIOGRAM;  Surgeon: Melrose Nakayama, MD;  Location: [place];  Service: Open Heart Surgery;  Laterality: N/A;  . LEFT HEART CATHETERIZATION WITH CORONARY ANGIOGRAM N/A 01/06/2013   Procedure: LEFT HEART CATHETERIZATION WITH CORONARY ANGIOGRAM;  Surgeon: Peter M Martinique, MD;  [facility];  Service: Cardiovascular;  Laterality: N/A;  . LEFT HEART CATHETERIZATION WITH CORONARY ANGIOGRAM N/A 01/21/2013   Procedure: LEFT HEART CATHETERIZATION WITH CORONARY ANGIOGRAM;  Surgeon: Burnell Blanks, MD;  Location[facility];  Service: Cardiovascular;  Laterality: N/A;  . LUMBAR LAMINECTOMY/ DECOMPRESSION WITH MET-RX Right 06/30/2018   Procedure: Right minimally invasive Lumbar Three-Four Far lateral discectomy;  Surgeon: Judith Part, MD;  Location: [place];  Service: Neurosurgery;  Laterality: Right;  Right minimally invasive Lumbar Three-Four Far lateral discectomy  . MASS EXCISION Right 07/21/2014   Procedure: EXCISION RIGHT NECK MASS;  Surgeon: Erroll Luna, MD;  Location: [place];  Service: General;  Laterality: Right;  . PERICARDIAL TAP N/A 01/24/2013   Procedure: PERICARDIAL TAP;  Surgeon: Blane Ohara, MD;  Location: [facility] CATH LAB;  Service: Cardiovascular;  Laterality: N/A;  .  RIGHT HEART CATHETERIZATION Right 01/24/2013   Procedure: RIGHT HEART CATH;  Surgeon: Blane Ohara, MD;  Location: [facility] CATH LAB;  Service: Cardiovascular;  Laterality: Right;  . VENTRICULAR ANEURYSM RESECTION N/A 01/28/2013   Procedure: LEFT VENTRICULAR ANEURYSM REPAIR;  Surgeon: Melrose Nakayama, MD;  Location: [place];  Service: Open Heart Surgery;  Laterality: N/A;    ROS: Review of Systems Negative except as stated above  PHYSICAL EXAM: BP 138/90   Pulse 64   Temp 98.3 F (36.8 C) (Oral)   Resp 16   Ht 6' (1.829 m)   Wt 237 lb 6.4 oz (107.7 kg)   SpO2 96%   BMI 32.20 kg/m   Physical Exam General appearance - alert, well appearing, middle age AAM.  Patient is notably uncomfortable when getting up out of the chair, with walking and with transfer to the exam table.  Mental status - normal mood, behavior, speech, dress, motor activity, and thought processes Eyes - vision using Snellen chart is 20/70 BL, 20/70 RT and 20/100 LT Chest - clear to auscultation, no wheezes, rales or rhonchi, symmetric air entry Heart - RRR Neurological -power in the left lower extremities 5/5 proximally and distally.  Power right lower extremity 3-4/5 proximally and distally.  Patient gives to pain.  He has decreased sensation in the right lower leg.  Gait is slowed.  But steady Musculoskeletal -mild to moderate tenderness on palpation of the paraspinal muscle in the right lumbar region.  Straight leg raise good on the left side but very limited on the right due to pain.  CMP Latest Ref Rng & Units 06/30/2018 03/29/2018 12/30/2017  Glucose 70 - 99 mg/dL 92 103(H) 91  BUN 6 - 20 mg/dL _0 Creatinine 0.61 - 1.24 mg/dL 0.85 0.96 0.93  Sodium 135 - 145 mmol/L 138 138 140  Potassium 3.5 - 5.1 mmol/L 3.8 4.1 4.2  Chloride 98 - 111 mmol/L 106 104 103  CO2 22 - 32 mmol/L 22  25 22  Calcium 8.9 - 10.3 mg/dL 9.5 9.8 9.5  Total Protein 6.5 - 8.1 g/dL - 7.4 6.8  Total Bilirubin 0.3 - 1.2 mg/dL - 0.9 0.3   Alkaline Phos 38 - 126 U/L - 124 105  AST 15 - 41 U/L - 22 20  ALT 0 - 44 U/L - 23 16   Lipid Panel     Component Value Date/Time   CHOL 158 12/30/2017 1450   TRIG 89 12/30/2017 1450   HDL 57 12/30/2017 1450   CHOLHDL 2.8 12/30/2017 1450   CHOLHDL 2.5 11/03/2015 1609   VLDL 19 11/03/2015 1609   LDLCALC 83 12/30/2017 1450    CBC    Component Value Date/Time   WBC 5.3 06/30/2018 1139   RBC 5.04 06/30/2018 1139   HGB 14.6 06/30/2018 1139   HGB 14.8 12/30/2017 1450   HCT 44.3 06/30/2018 1139   HCT 43.4 12/30/2017 1450   PLT 142 (L) 06/30/2018 1139   PLT 154 12/30/2017 1450   MCV 87.9 06/30/2018 1139   MCV 85 12/30/2017 1450   MCH 29.0 06/30/2018 1139   MCHC 33.0 06/30/2018 1139   RDW 13.1 06/30/2018 1139   RDW 12.8 12/30/2017 1450   LYMPHSABS 1.9 03/29/2018 1333   MONOABS 0.7 03/29/2018 1333   EOSABS 0.1 03/29/2018 1333   BASOSABS 0.0 03/29/2018 1333    ASSESSMENT AND PLAN: 1. Lumbar radiculopathy We need to get the patient back to a neurosurgeon.  Given the continued issue that he is having with his back and his vision, I will write a letter in support of disability. -In the meantime I have recommended that he apply for the orange card/cone discount card. He should also try to apply for Medicaid.  We will have case worker follow-up with him -We will start him on tramadol and gabapentin for pain.  Advised that tramadol is a controlled substance.  I went over controlled substance prescribing agreement with him.  Patient is agreeable.  He denies history of excessive EtOH use.  Denies any street drug use.  Agreeable to having a urine drug screen today [facility] reviewed We will give him a cane if we currently have any in stock. - [id number] 11+Oxyco+Alc+Crt-Bund - gabapentin (NEURONTIN) 300 MG capsule; 1 cap PO daily QHS x 1 wk then BID  Dispense: 60 capsule; Refill: 3 - traMADol (ULTRAM) 50 MG tablet; Take 1 tablet (50 mg total) by mouth every 6 (six) hours as needed.  Dispense:  100 tablet; Refill: 0  2. Poor vision 3. Glaucoma, unspecified glaucoma type, unspecified laterality 4. Cortical age-related cataract of both eyes Once he has coverage, we will get him back in with Dr. Schuyler Amor  5. Essential hypertension I have sent his prescriptions to our pharmacy to see if they could assist with getting his medications.  Patient told he will need to apply for the blue card - lisinopril (ZESTRIL) 10 MG tablet; Take 1 tablet (10 mg total) by mouth 2 (two) times daily.  Dispense: 60 tablet; Refill: 3 - carvedilol (COREG) 25 MG tablet; Take 1 tablet (25 mg total) by mouth 2 (two) times daily with a meal. NEED OV.  Dispense: 60 tablet; Refill: 5  6. Coronary artery disease involving coronary bypass graft of native heart without angina pectoris See #5 above - atorvastatin (LIPITOR) 80 MG tablet; Take 1 tablet (80 mg total) by mouth daily.  Dispense: 30 tablet; Refill: 3 - lisinopril (ZESTRIL) 10 MG tablet; Take 1 tablet (10 mg total) by mouth  2 (two) times daily.  Dispense: 60 tablet; Refill: 3  7. Hyperlipidemia, unspecified hyperlipidemia type - atorvastatin (LIPITOR) 80 MG tablet; Take 1 tablet (80 mg total) by mouth daily.  Dispense: 30 tablet; Refill: 3  8. Pain management contract agreement - [id number] 11+Oxyco+Alc+Crt-Bund  Patient was given the opportunity to ask questions.  Patient verbalized understanding of the plan and was able to repeat key elements of the plan.   Orders Placed This Encounter  Procedures  . [id number] 11+Oxyco+Alc+Crt-Bund     Requested Prescriptions   Signed Prescriptions Disp Refills  . gabapentin (NEURONTIN) 300 MG capsule 60 capsule 3    Sig: 1 cap PO daily QHS x 1 wk then BID  . traMADol (ULTRAM) 50 MG tablet 100 tablet 0    Sig: Take 1 tablet (50 mg total) by mouth every 6 (six) hours as needed.  Marland Kitchen atorvastatin (LIPITOR) 80 MG tablet 30 tablet 3    Sig: Take 1 tablet (80 mg total) by mouth daily.  Marland Kitchen lisinopril (ZESTRIL) 10 MG tablet 60  tablet 3    Sig: Take 1 tablet (10 mg total) by mouth 2 (two) times daily.  . carvedilol (COREG) 25 MG tablet 60 tablet 5    Sig: Take 1 tablet (25 mg total) by mouth 2 (two) times daily with a meal. NEED OV.    Return in about 4 weeks (around 01/01/2019) for chronic ds management.  Karle Plumber, MD, FACP

## 2018-12-04 NOTE — Patient Instructions (Signed)
I have sent refills on your heart medications and blood pressure medications to our pharmacy.  Start Tramadol as needed for pain. Start Gabapentin as discussed.

## 2018-12-05 ENCOUNTER — Ambulatory Visit: Payer: Self-pay | Attending: Internal Medicine | Admitting: *Deleted

## 2018-12-05 DIAGNOSIS — Z20828 Contact with and (suspected) exposure to other viral communicable diseases: Secondary | ICD-10-CM

## 2018-12-05 DIAGNOSIS — Z20822 Contact with and (suspected) exposure to covid-19: Secondary | ICD-10-CM

## 2018-12-05 LAB — DRUG SCREEN 764883 11+OXYCO+ALC+CRT-BUND
Amphetamines, Urine: NEGATIVE ng/mL
BENZODIAZ UR QL: NEGATIVE ng/mL
Barbiturate: NEGATIVE ng/mL
Cannabinoid Quant, Ur: NEGATIVE ng/mL
Cocaine (Metabolite): NEGATIVE ng/mL
Creatinine: 139 mg/dL (ref 20.0–300.0)
Ethanol: NEGATIVE %
Meperidine: NEGATIVE ng/mL
Methadone Screen, Urine: NEGATIVE ng/mL
OPIATE SCREEN URINE: NEGATIVE ng/mL
Oxycodone/Oxymorphone, Urine: NEGATIVE ng/mL
Phencyclidine: NEGATIVE ng/mL
Propoxyphene: NEGATIVE ng/mL
Tramadol: NEGATIVE ng/mL
pH, Urine: 5.6 (ref 4.5–8.9)

## 2018-12-05 MED FILL — ATORVASTATIN 80 MG TABLET: 80 | 30 days supply | Qty: 30 | Fill #0

## 2018-12-05 MED FILL — GABAPENTIN 300 MG CAPSULE: 300 | 33 days supply | Qty: 60 | Fill #0

## 2018-12-05 MED FILL — CARVEDILOL 25 MG TABLET: 25 | 30 days supply | Qty: 60 | Fill #0

## 2018-12-05 MED FILL — traMADol HCL 50 MG TABS: 50 | 25 days supply | Qty: 100 | Fill #0

## 2018-12-05 MED FILL — LISINOPRIL 10 MG TABS: 10 | 30 days supply | Qty: 60 | Fill #0

## 2018-12-06 LAB — NOVEL CORONAVIRUS, NAA: SARS-CoV-2, NAA: NOT DETECTED

## 2018-12-09 ENCOUNTER — Telehealth: Payer: Self-pay

## 2018-12-09 NOTE — Telephone Encounter (Signed)
Contacted pt to go over lab results pt is aware and doesn't have any questions or concerns 

## 2018-12-09 NOTE — Telephone Encounter (Signed)
Attempted to contact patient # [phone]  to discuss process of getting disability benefits re-instated and possible need for assistance from [employer] [place].  Message left requesting a call back to this CM # [phone]

## 2018-12-10 ENCOUNTER — Telehealth: Payer: Self-pay

## 2018-12-10 NOTE — Telephone Encounter (Signed)
Call placed to patient to discuss concerns about his disability.  He explained that he has not been able to work. He had been receiving SSD for about 2 years and his benefits were cut 06/2018 and he has no idea why. He is working directly with the [place] on an appeal  but is agreeable to placing a referral to [employer] [place] for assistance. Informed him that if they are not able to assist, they should be able to refer him to an agency that can assist.  Referral completed and faxed to attention of Abelino Derrick.  The patient also stated that he has applied for medicaid and is waiting for a decision.  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 This CM explained that the [facility] blue card can be obtained through the financial counselor and this would provide assistance with medication costs.  He has an appointment with [facility] Financial Counselor 12/17/2018.

## 2018-12-16 ENCOUNTER — Telehealth: Payer: Self-pay

## 2018-12-16 NOTE — Telephone Encounter (Signed)
Spoke to patient he applied for disability.He wanted to know if we received a request for records to be faxed.After reviewing chart we have not received a request for records to be faxed.He will call social security and let them know.

## 2018-12-17 ENCOUNTER — Ambulatory Visit: Payer: Self-pay

## 2018-12-31 ENCOUNTER — Telehealth: Payer: Self-pay

## 2018-12-31 NOTE — Telephone Encounter (Signed)
As per Abelino Derrick, [employer] [place], they are experiencing difficulty reaching the patient

## 2019-01-08 ENCOUNTER — Encounter (HOSPITAL_COMMUNITY): Payer: Self-pay | Admitting: Physician Assistant

## 2019-01-16 ENCOUNTER — Ambulatory Visit: Payer: Medicaid Other | Admitting: Physical Therapy

## 2019-01-19 ENCOUNTER — Other Ambulatory Visit: Payer: Self-pay

## 2019-01-19 DIAGNOSIS — Z20822 Contact with and (suspected) exposure to covid-19: Secondary | ICD-10-CM

## 2019-01-20 ENCOUNTER — Telehealth: Payer: Self-pay | Admitting: Internal Medicine

## 2019-01-20 ENCOUNTER — Ambulatory Visit: Payer: Self-pay | Admitting: Internal Medicine

## 2019-01-20 DIAGNOSIS — I25810 Atherosclerosis of coronary artery bypass graft(s) without angina pectoris: Secondary | ICD-10-CM

## 2019-01-20 DIAGNOSIS — E785 Hyperlipidemia, unspecified: Secondary | ICD-10-CM

## 2019-01-20 NOTE — Telephone Encounter (Signed)
New Message   Pt calling states he needs to be accompanied by someone to his appts and he is needing a note from Dr Wynetta Emery stating so. Please f/u

## 2019-01-21 ENCOUNTER — Telehealth: Payer: Self-pay | Admitting: General Practice

## 2019-01-21 LAB — NOVEL CORONAVIRUS, NAA: SARS-CoV-2, NAA: NOT DETECTED

## 2019-01-21 NOTE — Telephone Encounter (Signed)
Forwarded to Dr. Margarita Rana

## 2019-01-21 NOTE — Telephone Encounter (Signed)
Negative COVID results given. Patient results "NOT Detected." Caller expressed understanding. ° °

## 2019-01-26 ENCOUNTER — Ambulatory Visit (HOSPITAL_COMMUNITY): Payer: Medicaid Other | Attending: Cardiology

## 2019-01-26 ENCOUNTER — Other Ambulatory Visit: Payer: Self-pay

## 2019-01-26 DIAGNOSIS — I25810 Atherosclerosis of coronary artery bypass graft(s) without angina pectoris: Secondary | ICD-10-CM

## 2019-01-26 DIAGNOSIS — Z8673 Personal history of transient ischemic attack (TIA), and cerebral infarction without residual deficits: Secondary | ICD-10-CM

## 2019-01-26 DIAGNOSIS — Z01810 Encounter for preprocedural cardiovascular examination: Secondary | ICD-10-CM

## 2019-01-26 DIAGNOSIS — I233 Rupture of cardiac wall without hemopericardium as current complication following acute myocardial infarction: Secondary | ICD-10-CM

## 2019-01-26 DIAGNOSIS — I255 Ischemic cardiomyopathy: Secondary | ICD-10-CM

## 2019-01-26 MED ORDER — PERFLUTREN LIPID MICROSPHERE
1.0000 mL | INTRAVENOUS | Status: AC | PRN
  Administered 2019-01-26: 3.5 mL via INTRAVENOUS

## 2019-01-27 ENCOUNTER — Other Ambulatory Visit: Payer: Self-pay

## 2019-01-27 ENCOUNTER — Ambulatory Visit: Payer: Medicare Other | Attending: Neurological Surgery

## 2019-01-27 DIAGNOSIS — M62830 Muscle spasm of back: Secondary | ICD-10-CM | POA: Diagnosis not present

## 2019-01-27 DIAGNOSIS — M2560 Stiffness of unspecified joint, not elsewhere classified: Secondary | ICD-10-CM | POA: Diagnosis not present

## 2019-01-27 DIAGNOSIS — M5416 Radiculopathy, lumbar region: Secondary | ICD-10-CM | POA: Diagnosis not present

## 2019-01-27 DIAGNOSIS — R293 Abnormal posture: Secondary | ICD-10-CM

## 2019-01-27 MED ORDER — ATORVASTATIN CALCIUM 80 MG PO TABS: 80.0000 mg | ORAL_TABLET | Freq: Every day | ORAL | 3 refills | Status: DC

## 2019-01-27 NOTE — Telephone Encounter (Signed)
Patient states due to his pain, unsteady gait and eye concerns he needs someone to accompany him and assist with filling out paperwork. Please advise if letter can be constructed

## 2019-01-27 NOTE — Therapy (Signed)
[place], [place], [address] Phone: [phone]   Fax:  [phone]  Physical Therapy Evaluation  Patient Details  Name: BOOKERT GUZZI MRN: [id number] Date of Birth: 1960-10-31 Referring Provider (PT): Emelda Brothers, MD   Encounter Date: 01/27/2019  PT End of Session - 01/27/19 1057    Visit Number  1    Number of Visits  12    Date for PT Re-Evaluation  03/13/19    Authorization Type  MCD    PT Start Time  0109    PT Stop Time  1120    PT Time Calculation (min)  35 min       Past Medical History:  Diagnosis Date  . Abnormal EKG    hx of ischemia showing up on ekg's  . Acute myopericarditis    a. 03/2013: readm with hypoxia, tachycardia, elevated ESR/CRP, elevated troponin with 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and myopericarditis; treated with steroids.  . Acute respiratory failure ([place])    a. 01/2013: VDRF. b. 03/2013: hypoxia requiring supp O2 during adm, resolved by discharge.  . C. difficile colitis    a. 01/2013 during prolonged adm. b. Recurred 03/2013.  . Cardiac tamponade    a. 01/2013 s/p drain.  . Coronary artery disease    a. s/p MI in 2009 in [place] with stenting of the LCX and LAD;  b. 12/2012 NSTEMI/CAD: LM nl, LAD patent prox stent, LCX 50-70 isr (FFR 0.84), RCA dom, 166m EF 40-45%-->Med Rx. c. 01/2013: anterolateral STEMI complicated by pericardial effusion (presumed purulent pericarditis) and tamponade s/p drain, ruptured LV pseudoaneurysm s/p CorMatrix patch, CABGx1 (SVG-OM1), fever, CVA, VDRF, C diff.  . CVA (cerebral infarction)    a. 01/2013 in setting of prolonged hospitalization - residual L arm weakness.  . 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([place]    a. 03/2013: readm with hypoxia, tachycardia, elevated ESR/CRP, elevated troponin with 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and myopericarditis; treated with steroids.  . Hyperlipidemia   . Hypokalemia   . Hyponatremia    a. During late 2014.  . Ischemic cardiomyopathy    a. Sept/Oct 2014: EF ~40% (ICM). b. 03/2013: EF 25-30%. Off ACEI due to hypotension (MIXED NICM/ICM).  .[place]  . Pericardial effusion    a. 01/2013: pericardial effusion (presumed purulent pericarditis) and cardiac tamponade s/p drain. b. Persistent moderate pericardial effusion 03/2013.  . Pre-diabetes   . Pseudoaneurysm of left ventricle of heart    a. 01/2013:  Ruptured inferoposterior LV pseudoaneurysm s/p CorMatrix patch.  . Sinus bradycardia    asymptomatic  . Stroke ([facility] 2009   weak lt side-lt arm  . Tobacco abuse    a. quit 12/2012.  . Wears dentures    top    Past Surgical History:  Procedure Laterality Date  . CARDIAC CATHETERIZATION  01/06/2013  . COLONOSCOPY WITH PROPOFOL N/A 07/03/2016   Procedure: COLONOSCOPY WITH PROPOFOL;  Surgeon: SManus Gunning MD;  Location: [facility];  Service: Gastroenterology;  Laterality: N/A;  . CORONARY ANGIOPLASTY WITH STENT PLACEMENT  2000's X 2   "1 + 1" (01/06/2013)  . CORONARY ARTERY BYPASS GRAFT N/A 01/28/2013   Procedure: CORONARY ARTERY BYPASS GRAFTING (CABG);  Surgeon: SMelrose Nakayama MD;  [place]  Service: Open Heart Surgery;  Laterality: N/A;  CABG x one, using left greater saphenous vein harvested endoscopically  . INTRAOPERATIVE TRANSESOPHAGEAL ECHOCARDIOGRAM N/A 01/28/2013   Procedure: INTRAOPERATIVE TRANSESOPHAGEAL ECHOCARDIOGRAM;  Surgeon: SMelrose Nakayama MD;  [place]  Service: Open  Heart Surgery;  Laterality: N/A;  . LEFT HEART CATHETERIZATION WITH CORONARY ANGIOGRAM N/A 01/06/2013   Procedure: LEFT HEART CATHETERIZATION WITH CORONARY ANGIOGRAM;  Surgeon: Peter M Martinique, MD;  Location: [facility] CATH LAB;  Service: Cardiovascular;  Laterality: N/A;  . LEFT HEART CATHETERIZATION WITH CORONARY ANGIOGRAM N/A 01/21/2013   Procedure: LEFT HEART CATHETERIZATION WITH CORONARY ANGIOGRAM;  Surgeon: Burnell Blanks, MD;  Location: [facility] CATH LAB;  Service: Cardiovascular;   Laterality: N/A;  . LUMBAR LAMINECTOMY/ DECOMPRESSION WITH MET-RX Right 06/30/2018   Procedure: Right minimally invasive Lumbar Three-Four Far lateral discectomy;  Surgeon: Judith Part, MD;  Location: [place];  Service: Neurosurgery;  Laterality: Right;  Right minimally invasive Lumbar Three-Four Far lateral discectomy  . MASS EXCISION Right 07/21/2014   Procedure: EXCISION RIGHT NECK MASS;  Surgeon: Erroll Luna, MD;  Location: [place];  Service: General;  Laterality: Right;  . PERICARDIAL TAP N/A 01/24/2013   Procedure: PERICARDIAL TAP;  Surgeon: Blane Ohara, MD;  Location: [facility] CATH LAB;  Service: Cardiovascular;  Laterality: N/A;  . RIGHT HEART CATHETERIZATION Right 01/24/2013   Procedure: RIGHT HEART CATH;  Surgeon: Blane Ohara, MD;  Location: [facility] CATH LAB;  Service: Cardiovascular;  Laterality: Right;  . VENTRICULAR ANEURYSM RESECTION N/A 01/28/2013   Procedure: LEFT VENTRICULAR ANEURYSM REPAIR;  Surgeon: Melrose Nakayama, MD;  Location: [place];  Service: Open Heart Surgery;  Laterality: N/A;    There were no vitals filed for this visit.   Subjective Assessment - 01/27/19 1049    Subjective  He reports discectomy L3   done 06/30/18.   He reports surgery did not improve symptoms.  He has pain RT leg and numbness. and some back pain.    Pertinent History  No extremitiy surgeries or pervious back surgeries    Limitations  Walking   difficulty with stairs, getting out of chair. ,   How long can you sit comfortably?  5 min    How long can you stand comfortably?  As needed    How long can you walk comfortably?  < 1 block    Diagnostic tests  None since surgery.    Patient Stated Goals  Decrease  pain.    Currently in Pain?  Yes    Pain Score  2     Pain Location  Back    Pain Orientation  Right    Pain Descriptors / Indicators  --   pinching.   Pain Radiating Towards  Rt leg  from top of knee to ankle ananterior . and lateral posterior thigh to knee     Pain Onset  More than a month ago    Pain Frequency  Constant    Aggravating Factors   Sit/ Lye    Pain Relieving Factors  Nothing, changing positions         [facility] PT Assessment - 01/27/19 0001      Assessment   Medical Diagnosis  lumbar radiculopathy    Referring Provider (PT)  Emelda Brothers, MD    Onset Date/Surgical Date  06/30/18      Precautions   Precautions  None      Restrictions   Weight Bearing Restrictions  No      Balance Screen   Has the patient fallen in the past 6 months  Yes    How many times?  8   getting up from chair   Has the patient had a decrease in activity level because of a fear  of falling?   Yes    Is the patient reluctant to leave their home because of a fear of falling?   No      Prior Function   Level of Independence  Needs assistance with homemaking;Needs assistance with ADLs;Requires assistive device for independence    Vocation  Part time employment    Physicist, medical      Cognition   Overall Cognitive Status  Within Functional Limits for tasks assessed      Posture/Postural Control   Posture Comments  LT lateral shift      ROM / Strength   AROM / PROM / Strength  AROM;PROM;Strength      AROM   AROM Assessment Site  Lumbar    Lumbar Flexion  70   More pain   Lumbar Extension  20    Lumbar - Right Side Bend  10    Lumbar - Left Side Bend  15      PROM   Overall PROM Comments  Stiffness of hip rotaiton but [facility]      Strength   Overall Strength Comments  Grossly LT and RT LE WNL. Some cogwheeling with LT leg testing      Palpation   Palpation comment  Tender aroun RT hip glut med and min                Objective measurements completed on examination: See above findings.              PT Education - 01/27/19 1057    Education Details  POC    Person(s) Educated  Patient    Methods  Explanation    Comprehension  Verbalized understanding       PT Short Term Goals - 01/27/19  1046      PT SHORT TERM GOAL #1   Title  He will be indpendent with initiAL Hep    Baseline  NO PROGRAM    Time  2    Period  Weeks    Status  New      PT SHORT TERM GOAL #2   Title  He will report 10% decr in RT hip/back pain    Baseline  varies from mild to lower severe levels depending on time of activity/position    Time  2    Period  Weeks    Status  New        PT Long Term Goals - 01/27/19 1313      PT LONG TERM GOAL #1   Title  He will be indpendent with all HEP issued    Baseline  Indepndent with initial hEP    Time  6    Period  Weeks    Status  New      PT LONG TERM GOAL #2   Title  He will report pain decreased 50% or more in RT hip and back and be intermittant    Baseline  constant pain    Time  6    Period  Weeks    Status  New      PT LONG TERM GOAL #3   Title  He will report able to lye on rT side for short periods due to decrased pain    Baseline  unable to lye on RT side to sleep    Time  6    Period  Weeks    Status  New      PT LONG TERM GOAL #  4   Title  He will report greater time sitting to 30 minor more with support  before pain starts    Baseline  5 min with weight to LT hip    Time  6    Period  Weeks    Status  New      PT LONG TERM GOAL #5   Title  He will be able to walk without SPC in comunity    Baseline  uses SPC to ambulate out of home.    Time  6    Period  Weeks    Status  New      Additional Long Term Goals   Additional Long Term Goals  Yes      PT LONG TERM GOAL #6   Title  He will report getting to standing from sitting is 50% or more easier    Baseline  He reports difficulty getting out of chair and has falen while doing this in past    Time  6    Period  Weeks    Status  New             Plan - 01/27/19 1130    Personal Factors and Comorbidities  Past/Current Experience;Time since onset of injury/illness/exacerbation;Age    Examination-Activity Limitations  Stand;Bed Mobility;Locomotion  Level;Bend;Carry;Sleep;Dressing;Stairs    Examination-Participation Restrictions  Art gallery manager;[employer] Work;Laundry   work   Stability/Clinical Decision Making  Evolving/Moderate complexity    Clinical Decision Making  Moderate    Rehab Potential  Good    PT Frequency  3x / week    PT Duration  --   over 2 weeks then 2x/week for 4-6 weeks   PT Treatment/Interventions  Dry needling;Passive range of motion;Manual techniques;Patient/family education;Therapeutic activities;Gait training;Moist Heat;Iontophoresis 16m/ml Dexamethasone;Electrical Stimulation;Therapeutic exercise    PT Next Visit Plan  Review HEp , modalities and manual for ROM and STW    PT Home Exercise Plan  Knee to chest, LTR,  piriformis    Consulted and Agree with Plan of Care  Patient       Patient will benefit from skilled therapeutic intervention in order to improve the following deficits and impairments:  Pain, Postural dysfunction, Increased muscle spasms, Decreased activity tolerance, Decreased range of motion, Decreased strength, Difficulty walking  Visit Diagnosis: Radiculopathy, lumbar region  Muscle spasm of back  Joint stiffness of spine  Abnormal posture     Problem List Patient Active Problem List   Diagnosis Date Noted  . Lumbar radiculopathy 12/04/2018  . Glaucoma 12/04/2018  . Cortical age-related cataract of both eyes 12/04/2018  . Pain management contract agreement 12/04/2018  . Lumbar herniated disc 06/04/2018  . Prediabetes 01/01/2018  . Hyperlipidemia 12/30/2017  . Obesity (BMI 30.0-34.9) 12/30/2017  . Benign neoplasm of descending colon   . Essential hypertension 11/03/2015  . Cardiomyopathy, ischemic 06/03/2015  . Chronic systolic dysfunction of left ventricle 06/03/2015  . Erectile dysfunction due to arterial insufficiency 12/23/2014  . Lipoma of skin and subcutaneous tissue of neck 05/24/2014  . History of cardioembolic stroke 094/[id number] . Vision loss of right eye  06/23/2013  . Dyslipidemia 06/22/2013  . 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([place]   . Coronary artery disease   . Cardiomyopathy ([place]   . Pseudoaneurysm of left ventricle of heart   . Tobacco abuse     CDarrel Hoover PT 01/27/2019, 1:19 PM  [facility]GRichland [place] [address]Phone: [phone]  Fax:  [phone]  Name: OLUWATOBI VISSER MRN: [id number] Date of Birth: 1960/05/17

## 2019-01-29 NOTE — Telephone Encounter (Signed)
Contacted pt and made aware that letter is ready for pick up pt will be by tomorrow to pick it up

## 2019-02-02 NOTE — Progress Notes (Signed)
The patient has been notified of the result and verbalized understanding.  All questions (if any) were answered. Jacqulynn Cadet, [place] 02/02/2019 5:11 PM

## 2019-02-03 ENCOUNTER — Telehealth: Payer: Self-pay | Admitting: Cardiology

## 2019-02-03 NOTE — Telephone Encounter (Signed)
Spoke to patient he stated he had a recent echo and was told to see Dr.Jordan soon.Virtual appointment scheduled with Dr.Jordan 02/04/19 at 8:00 am.

## 2019-02-03 NOTE — Telephone Encounter (Signed)
Patient was returning a call from our office. He had an echo and an ekg done at [facility] last week, and based on the results he was told to get an appt asap.

## 2019-02-04 ENCOUNTER — Encounter: Payer: Self-pay | Admitting: Cardiology

## 2019-02-04 ENCOUNTER — Telehealth: Payer: Self-pay

## 2019-02-04 ENCOUNTER — Ambulatory Visit: Payer: Medicaid Other | Admitting: Cardiology

## 2019-02-04 ENCOUNTER — Telehealth (INDEPENDENT_AMBULATORY_CARE_PROVIDER_SITE_OTHER): Payer: Medicare Other | Admitting: Cardiology

## 2019-02-04 ENCOUNTER — Telehealth: Payer: Medicaid Other | Admitting: Cardiology

## 2019-02-04 VITALS — Ht 73.0 in | Wt 241.0 lb

## 2019-02-04 DIAGNOSIS — Z8673 Personal history of transient ischemic attack (TIA), and cerebral infarction without residual deficits: Secondary | ICD-10-CM

## 2019-02-04 DIAGNOSIS — I255 Ischemic cardiomyopathy: Secondary | ICD-10-CM

## 2019-02-04 DIAGNOSIS — I25810 Atherosclerosis of coronary artery bypass graft(s) without angina pectoris: Secondary | ICD-10-CM

## 2019-02-04 DIAGNOSIS — R7303 Prediabetes: Secondary | ICD-10-CM

## 2019-02-04 DIAGNOSIS — I5022 Chronic systolic (congestive) heart failure: Secondary | ICD-10-CM | POA: Diagnosis not present

## 2019-02-04 DIAGNOSIS — E785 Hyperlipidemia, unspecified: Secondary | ICD-10-CM

## 2019-02-04 DIAGNOSIS — R0602 Shortness of breath: Secondary | ICD-10-CM

## 2019-02-04 MED ORDER — SILDENAFIL CITRATE 100 MG PO TABS: 100.0000 mg | ORAL_TABLET | Freq: Every day | ORAL | 6 refills | Status: DC | PRN

## 2019-02-04 NOTE — Addendum Note (Signed)
Addended by: Kathyrn Lass on: 02/04/2019 10:05 AM   Modules accepted: Orders

## 2019-02-04 NOTE — Patient Instructions (Signed)
Medication Instructions:  Continue same medications Viagra refill sent to [facility] at [facility]   *If you need a refill on your cardiac medications before your next appointment, please call your pharmacy*  Lab Work: Cmet,a1c,lipid panel,bnp,magnesium lab to be done 10/27 at our office [place] No food  Lab opens 8:00 am to 12:00 noon no appointment needed.  Testing/Procedures: None ordered   Follow-Up: At [facility], you and your health needs are our priority.  As part of our continuing mission to provide you with exceptional heart care, we have created designated Provider Care Teams.  These Care Teams include your primary Cardiologist (physician) and Advanced Practice Providers (APPs -  Physician Assistants and Nurse Practitioners) who all work together to provide you with the care you need, when you need it.  Your next appointment:  Will be scheduled with Pharmacist to start Entresto/Aldactone Scheduler will call with appointment   Scheduler will call with appointment to see Dr.Jordan 3 months after pharmacy appointment   Call back sooner if needed

## 2019-02-04 NOTE — Progress Notes (Signed)
Virtual Visit via Video Note   This visit type was conducted due to national recommendations for restrictions regarding the COVID-19 Pandemic (e.g. social distancing) in an effort to limit this patient's exposure and mitigate transmission in our community.  Due to his co-morbid illnesses, this patient is at least at moderate risk for complications without adequate follow up.  This format is felt to be most appropriate for this patient at this time.  All issues noted in this document were discussed and addressed.  A limited physical exam was performed with this format.  Please refer to the patient's chart for his consent to telehealth for [facility].   Date:  02/04/2019   ID:  [name], DOB 1961-01-18, MRN [id number]  Patient Location: Home Provider Location: Home  PCP:  Ladell Pier, MD  Cardiologist:  Hazelee Harbold Martinique, MD  Electrophysiologist:  None   Evaluation Performed:  Follow-Up Visit  Chief Complaint:  CHF/CAD  History of Present Illness:    [name] is a 58 y.o. male with  PMH of hyperlipidemia,  CVA, prediabetes, CAD, ischemic cardiomyopathy, history of cardiac tamponade and  pericarditis.  He had stents to the left circumflex and LAD in 2007.  In September 2014, he presented with chest pain.  Repeat cardiac catheterization showed totally occluded RCA and a 50% in-stent restenosis of left circumflex stent.  Medical therapy was recommended.  He was admitted in October 2014 with a large pericardial effusion with tamponade due to ruptured LV pseudoaneurysm.  Pericardial drain was placed.  Prior to discharge, he developed signs of stroke with left arm weakness and left visual field deficit.  He then developed hemodynamic collapse and  required intubation.  Repeat echocardiogram showed complex pericardial effusion with right ventricular collapse and a tamponade.  He underwent emergent median sternotomy and repair of ruptured inferoseptal posterior LV  pseudoaneurysm with a core matrix patch and CABG x1 with SVG to OM by Dr. Roxan Hockey.  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  His EF by echocardiogram at the time was 25% with moderate pericardial effusion.  He also had recurrent C. difficile infection as well.  Echocardiogram by August 2015 showed EF of 30%.  Echocardiogram in February 2017 showed EF of 30 to 35%.   He was seen by Almyra Deforest PA-C in March 2020 for pre op clearance for lumbar surgery.  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  he did undergo L3-4 discectomy without complication. Recent repeat Echo showed persistently low EF 25-30%.   On follow up today he reports he feels tired a lot and activity wears him out. He does get SOB with activity and even just doing light housework. He denies any chest pain. No edema. Weight has increased some but he states he has been eating more. Tries to avoid salt. He is still smoking 1 ppd. No palpitations or dizziness.   The patient does not have symptoms concerning for COVID-19 infection (fever, chills, cough, or new shortness of breath).    Past Medical History:  Diagnosis Date   Abnormal EKG    hx of ischemia showing up on ekg's   Acute myopericarditis    a. 03/2013: readm with hypoxia, tachycardia, elevated ESR/CRP, elevated troponin with 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and myopericarditis; treated with steroids.   Acute respiratory failure ([place])    a. 01/2013: VDRF. b. 03/2013: hypoxia requiring supp O2 during  adm, resolved by discharge.   C. difficile colitis    a. 01/2013 during prolonged adm. b. Recurred 03/2013.   Cardiac tamponade    a. 01/2013 s/p drain.   Coronary artery disease    a. s/p MI in 2009 in [place] with stenting of the LCX and LAD;  b. 12/2012 NSTEMI/CAD: LM nl, LAD patent prox stent, LCX 50-70 isr (FFR 0.84), RCA dom, 188m EF 40-45%-->Med Rx. c. 01/2013:  anterolateral STEMI complicated by pericardial effusion (presumed purulent pericarditis) and tamponade s/p drain, ruptured LV pseudoaneurysm s/p CorMatrix patch, CABGx1 (SVG-OM1), fever, CVA, VDRF, C diff.   CVA (cerebral infarction)    a. 01/2013 in setting of prolonged hospitalization - residual L arm weakness.   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[place]    a. 03/2013: readm with hypoxia, tachycardia, elevated ESR/CRP, elevated troponin with 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and myopericarditis; treated with steroids.   Hyperlipidemia    Hypokalemia    Hyponatremia    a. During late 2014.   Ischemic cardiomyopathy    a. Sept/Oct 2014: EF ~40% (ICM). b. 03/2013: EF 25-30%. Off ACEI due to hypotension (MIXED NICM/ICM).   Myocardial infarction ([facility] 2009   Pericardial effusion    a. 01/2013: pericardial effusion (presumed purulent pericarditis) and cardiac tamponade s/p drain. b. Persistent moderate pericardial effusion 03/2013.   Pre-diabetes    Pseudoaneurysm of left ventricle of heart    a. 01/2013:  Ruptured inferoposterior LV pseudoaneurysm s/p CorMatrix patch.   Sinus bradycardia    asymptomatic   Stroke ([facility] 2009   weak lt side-lt arm   Tobacco abuse    a. quit 12/2012.   Wears dentures    top   Past Surgical History:  Procedure Laterality Date   CARDIAC CATHETERIZATION  01/06/2013   COLONOSCOPY WITH PROPOFOL N/A 07/03/2016   Procedure: COLONOSCOPY WITH PROPOFOL;  Surgeon: SManus Gunning MD;  [facility];  Service: Gastroenterology;  Laterality: N/A;   CORONARY ANGIOPLASTY WITH STENT PLACEMENT  2000's X 2   "1 + 1" (01/06/2013)   CORONARY ARTERY BYPASS GRAFT N/A 01/28/2013   Procedure: CORONARY ARTERY BYPASS GRAFTING (CABG);  Surgeon: SMelrose Nakayama MD;  [place]  Service: Open Heart Surgery;  Laterality: N/A;  CABG x one, using left greater saphenous vein harvested endoscopically   INTRAOPERATIVE TRANSESOPHAGEAL ECHOCARDIOGRAM N/A 01/28/2013   Procedure:  INTRAOPERATIVE TRANSESOPHAGEAL ECHOCARDIOGRAM;  Surgeon: SMelrose Nakayama MD;  [place]  Service: Open Heart Surgery;  Laterality: N/A;   LEFT HEART CATHETERIZATION WITH CORONARY ANGIOGRAM N/A 01/06/2013   Procedure: LEFT HEART CATHETERIZATION WITH CORONARY ANGIOGRAM;  Surgeon: Joory Gough M JMartinique MD;  [facility];  Service: Cardiovascular;  Laterality: N/A;   LEFT HEART CATHETERIZATION WITH CORONARY ANGIOGRAM N/A 01/21/2013   Procedure: LEFT HEART CATHETERIZATION WITH CORONARY ANGIOGRAM;  Surgeon: CBurnell Blanks MD;  [facility];  Service: Cardiovascular;  Laterality: N/A;   LUMBAR LAMINECTOMY/ DECOMPRESSION WITH MET-RX Right 06/30/2018   Procedure: Right minimally invasive Lumbar Three-Four Far lateral discectomy;  Surgeon: OJudith Part MD;  [place]  Service: Neurosurgery;  Laterality: Right;  Right minimally invasive Lumbar Three-Four Far lateral discectomy   MASS EXCISION Right 07/21/2014   Procedure: EXCISION RIGHT NECK MASS;  Surgeon: TErroll Luna MD;  [place]  Service: General;  Laterality: Right;   PERICARDIAL TAP N/A 01/24/2013   Procedure: PERICARDIAL TAP;  Surgeon: MBlane Ohara MD;  [facility];  Service: Cardiovascular;  Laterality: N/A;   RIGHT  HEART CATHETERIZATION Right 01/24/2013   Procedure: RIGHT HEART CATH;  Surgeon: Blane Ohara, MD;  Location[facility];  Service: Cardiovascular;  Laterality: Right;   VENTRICULAR ANEURYSM RESECTION N/A 01/28/2013   Procedure: LEFT VENTRICULAR ANEURYSM REPAIR;  Surgeon: Melrose Nakayama, MD;  Location: [place];  Service: Open Heart Surgery;  Laterality: N/A;     Current Meds  Medication Sig   ALPHAGAN P 0.1 % SOLN Place 1 drop into both eyes 2 (two) times daily.   aspirin 81 MG EC tablet Take 1 tablet (81 mg total) by mouth daily. Restart on 07/05/2018   atorvastatin (LIPITOR) 80 MG tablet Take 1 tablet (80 mg total) by mouth  daily.   carvedilol (COREG) 25 MG tablet Take 1 tablet (25 mg total) by mouth 2 (two) times daily with a meal. NEED OV.   Cholecalciferol (VITAMIN D) 2000 units tablet Take 2,000 Units by mouth daily.   latanoprost (XALATAN) 0.005 % ophthalmic solution Place 1 drop into both eyes daily.   lisinopril (ZESTRIL) 10 MG tablet Take 1 tablet (10 mg total) by mouth 2 (two) times daily.   Multiple Vitamin (MULTIVITAMIN WITH MINERALS) TABS tablet Take 1 tablet by mouth daily.     Allergies:   Patient has no known allergies.   Social History   Tobacco Use   Smoking status: Current Every Day Smoker    Packs/day: 1.50    Years: 35.00    Pack years: 52.50    Types: Cigarettes   Smokeless tobacco: Never Used  Substance Use Topics   Alcohol use: No    Alcohol/week: 0.0 standard drinks   Drug use: No     Family Hx: The patient's family history includes CAD in an other family member; Diabetes in his mother; HIV in his brother; Heart attack in his mother; Hypertension in his father and mother. There is no history of Stroke.  ROS:   Please see the history of present illness.   All other systems reviewed and are negative.   Prior CV studies:   The following studies were reviewed today:  Echo 01/26/19: IMPRESSIONS    1. Left ventricular ejection fraction, by visual estimation, is 25 to 30%. The left ventricle has normal function. Moderately increased left ventricular size. There is no left ventricular hypertrophy.  2. There is akinesis of the inferior and nferolateral wall,.  3. Elevated left ventricular end-diastolic pressure.  4. Left ventricular diastolic Doppler parameters are consistent with restrictive filling pattern of LV diastolic filling.  5. Global right ventricle has normal systolic function.The right ventricular size is normal. No increase in right ventricular wall thickness.  6. Left atrial size was mildly dilated.  7. Right atrial size was normal.  8. The mitral  valve is normal in structure. No evidence of mitral valve regurgitation. No evidence of mitral stenosis.  9. The tricuspid valve is normal in structure. Tricuspid valve regurgitation was not visualized by color flow Doppler. 10. The aortic valve is tricuspid Aortic valve regurgitation was not visualized by color flow Doppler. Mild aortic valve sclerosis without stenosis. 11. The pulmonic valve was normal in structure. Pulmonic valve regurgitation is trivial by color flow Doppler. 12. TR signal is inadequate for assessing pulmonary artery systolic pressure. 13. The inferior vena cava is dilated in size with <50% respiratory variability, suggesting right atrial pressure of 15 mmHg.  Labs/Other Tests and Data Reviewed:    EKG:  No ECG reviewed.  Recent Labs: 03/29/2018: ALT 23 06/30/2018: BUN 10; Creatinine,  Ser 0.85; Hemoglobin 14.6; Platelets 142; Potassium 3.8; Sodium 138   Recent Lipid Panel Lab Results  Component Value Date/Time   CHOL 158 12/30/2017 02:50 PM   TRIG 89 12/30/2017 02:50 PM   HDL 57 12/30/2017 02:50 PM   CHOLHDL 2.8 12/30/2017 02:50 PM   CHOLHDL 2.5 11/03/2015 04:09 PM   LDLCALC 83 12/30/2017 02:50 PM    Wt Readings from Last 3 Encounters:  02/04/19 241 lb (109.3 kg)  12/04/18 237 lb 6.4 oz (107.7 kg)  11/07/18 245 lb (111.1 kg)     Objective:    Vital Signs:  Ht 6' 1"  (1.854 m)    Wt 241 lb (109.3 kg)    BMI 31.80 kg/m    VITAL SIGNS:  reviewed  General WDBM in NAD HEENT normal. Respirations unlabored.  No edema Neuro alert and oriented x 3.  Mood normal  ASSESSMENT & PLAN:    1. Chronic systolic CHF. EF 25-30% by most recent Echo. Class 3 symptoms. Ischemic cardiomyopathy. I feel we have an opportunity to improve his medical therapy. Currently on Coreg and lisinopril. I would like to transition him from lisinopril to [facility]. He has Medicaid so this should be covered. Addition of aldactone should also be considered if tolerated. He will need to come  in for up to date lab work and then will arrange him to see our Pharm D for medication intiiation and titration. Ideally would like to optimize medical therapy over the next couple of months then reassess symptoms. We may also need to revisit an ischemia work up but I would first like to see how he responds to medical therapy. This will help me decide whether we could evaluate his ischemia with Myoview or if cardiac cath is needed for persistent symptoms. If EF remains low despite optimal medical therapy then placement of ICD also comes into play. Stressed the importance of low sodium diet.   2. CAD s/p remote stenting of LAD and LCx. CTO of the RCA. S/p CABG x 1 in 2014. See #1. No chest pain.   3. History of pericardial tamponade due to LV rupture. S/p patch repair in 2014. No recurrent effusion  4. History of CVA 2014  5. Hyperlipidemia- will update lab work  6. Pre diabetes.   7. Tobacco abuse recommend smoking cessation.    COVID-19 Education: The signs and symptoms of COVID-19 were discussed with the patient and how to seek care for testing (follow up with PCP or arrange E-visit).  The importance of social distancing was discussed today.  Time:   Today, I have spent 15 minutes with the patient with telehealth technology discussing the above problems.     Medication Adjustments/Labs and Tests Ordered: Current medicines are reviewed at length with the patient today.  Concerns regarding medicines are outlined above.   Tests Ordered: CMET, lipids, BNP, A1c, magnesium  Medication Changes: No orders of the defined types were placed in this encounter.   Follow Up:  In Person in 3 months after medication titration  Signed, Jessamyn Watterson Martinique, MD  02/04/2019 8:54 AM    [place]

## 2019-02-04 NOTE — Telephone Encounter (Signed)
As per Abelino Derrick, [employer] [facility], they have not been able to reach the patient but will continue to try to contact him.

## 2019-02-09 ENCOUNTER — Other Ambulatory Visit: Payer: Self-pay

## 2019-02-09 ENCOUNTER — Ambulatory Visit: Payer: Medicare Other

## 2019-02-09 DIAGNOSIS — R293 Abnormal posture: Secondary | ICD-10-CM | POA: Diagnosis not present

## 2019-02-09 DIAGNOSIS — M2560 Stiffness of unspecified joint, not elsewhere classified: Secondary | ICD-10-CM

## 2019-02-09 DIAGNOSIS — M62830 Muscle spasm of back: Secondary | ICD-10-CM | POA: Diagnosis not present

## 2019-02-09 DIAGNOSIS — M5416 Radiculopathy, lumbar region: Secondary | ICD-10-CM

## 2019-02-09 NOTE — Therapy (Signed)
[place], [place], [address] Phone: [phone]   Fax:  [phone]  Physical Therapy Treatment  Patient Details  Name: [name] MRN: [id number] Date of Birth: [birth year] Referring Provider (PT): Emelda Brothers, MD   Encounter Date: 02/09/2019  PT End of Session - 02/09/19 1133    Visit Number  2    Number of Visits  12    Date for PT Re-Evaluation  03/13/19    Authorization Type  MCD    Authorization Time Period  until 02/22/19    Authorization - Visit Number  1    Authorization - Number of Visits  3    PT Start Time  1130    PT Stop Time  1215    PT Time Calculation (min)  45 min    Activity Tolerance  Patient tolerated treatment well    Behavior During Therapy  [facility] for tasks assessed/performed       Past Medical History:  Diagnosis Date  . Abnormal EKG    hx of ischemia showing up on ekg's  . Acute myopericarditis    a. 03/2013: readm with hypoxia, tachycardia, elevated ESR/CRP, elevated troponin with 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and myopericarditis; treated with steroids.  . Acute respiratory failure ([place])    a. 01/2013: VDRF. b. 03/2013: hypoxia requiring supp O2 during adm, resolved by discharge.  . C. difficile colitis    a. 01/2013 during prolonged adm. b. Recurred 03/2013.  . Cardiac tamponade    a. 01/2013 s/p drain.  . Coronary artery disease    a. s/p MI in 2009 in [place] with stenting of the LCX and LAD;  b. 12/2012 NSTEMI/CAD: LM nl, LAD patent prox stent, LCX 50-70 isr (FFR 0.84), RCA dom, 186m EF 40-45%-->Med Rx. c. 01/2013: anterolateral STEMI complicated by pericardial effusion (presumed purulent pericarditis) and tamponade s/p drain, ruptured LV pseudoaneurysm s/p CorMatrix patch, CABGx1 (SVG-OM1), fever, CVA, VDRF, C diff.  . CVA (cerebral infarction)    a. 01/2013 in setting of prolonged hospitalization - residual L arm weakness.  . 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([place]    a. 03/2013: readm  with hypoxia, tachycardia, elevated ESR/CRP, elevated troponin with 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and myopericarditis; treated with steroids.  . Hyperlipidemia   . Hypokalemia   . Hyponatremia    a. During late 2014.  . Ischemic cardiomyopathy    a. Sept/Oct 2014: EF ~40% (ICM). b. 03/2013: EF 25-30%. Off ACEI due to hypotension (MIXED NICM/ICM).  .[place]  . Pericardial effusion    a. 01/2013: pericardial effusion (presumed purulent pericarditis) and cardiac tamponade s/p drain. b. Persistent moderate pericardial effusion 03/2013.  . Pre-diabetes   . Pseudoaneurysm of left ventricle of heart    a. 01/2013:  Ruptured inferoposterior LV pseudoaneurysm s/p CorMatrix patch.  . Sinus bradycardia    asymptomatic  . Stroke ([facility] 2009   weak lt side-lt arm  . Tobacco abuse    a. quit 12/2012.  . Wears dentures    top    Past Surgical History:  Procedure Laterality Date  . CARDIAC CATHETERIZATION  01/06/2013  . COLONOSCOPY WITH PROPOFOL N/A 07/03/2016   Procedure: COLONOSCOPY WITH PROPOFOL;  Surgeon: SManus Gunning MD;  [facility];  Service: Gastroenterology;  Laterality: N/A;  . CORONARY ANGIOPLASTY WITH STENT PLACEMENT  2000's X 2   "1 + 1" (01/06/2013)  . CORONARY ARTERY BYPASS GRAFT N/A 01/28/2013   Procedure: CORONARY ARTERY BYPASS GRAFTING (CABG);  Surgeon: SMelrose Nakayama  MD;  [facility];  Service: Open Heart Surgery;  Laterality: N/A;  CABG x one, using left greater saphenous vein harvested endoscopically  . INTRAOPERATIVE TRANSESOPHAGEAL ECHOCARDIOGRAM N/A 01/28/2013   Procedure: INTRAOPERATIVE TRANSESOPHAGEAL ECHOCARDIOGRAM;  Surgeon: Melrose Nakayama, MD;  Location: [place];  Service: Open Heart Surgery;  Laterality: N/A;  . LEFT HEART CATHETERIZATION WITH CORONARY ANGIOGRAM N/A 01/06/2013   Procedure: LEFT HEART CATHETERIZATION WITH CORONARY ANGIOGRAM;  Surgeon: Peter M Martinique, MD;  Location: [facility] CATH LAB;  Service: Cardiovascular;   Laterality: N/A;  . LEFT HEART CATHETERIZATION WITH CORONARY ANGIOGRAM N/A 01/21/2013   Procedure: LEFT HEART CATHETERIZATION WITH CORONARY ANGIOGRAM;  Surgeon: Burnell Blanks, MD;  Location: [facility] CATH LAB;  Service: Cardiovascular;  Laterality: N/A;  . LUMBAR LAMINECTOMY/ DECOMPRESSION WITH MET-RX Right 06/30/2018   Procedure: Right minimally invasive Lumbar Three-Four Far lateral discectomy;  Surgeon: Judith Part, MD;  Location: [place];  Service: Neurosurgery;  Laterality: Right;  Right minimally invasive Lumbar Three-Four Far lateral discectomy  . MASS EXCISION Right 07/21/2014   Procedure: EXCISION RIGHT NECK MASS;  Surgeon: Erroll Luna, MD;  Location: [place];  Service: General;  Laterality: Right;  . PERICARDIAL TAP N/A 01/24/2013   Procedure: PERICARDIAL TAP;  Surgeon: Blane Ohara, MD;  Location: [facility] C[facility] LAB;  Service: Cardiovascular;  Laterality: N/A;  . RIGHT HEART CATHETERIZATION Right 01/24/2013   Procedure: RIGHT HEART CATH;  Surgeon: Blane Ohara, MD;  Location: [facility] CATH LAB;  Service: Cardiovascular;  Laterality: Right;  . VENTRICULAR ANEURYSM RESECTION N/A 01/28/2013   Procedure: LEFT VENTRICULAR ANEURYSM REPAIR;  Surgeon: Melrose Nakayama, MD;  Location: [place];  Service: Open Heart Surgery;  Laterality: N/A;    There were no vitals filed for this visit.  Subjective Assessment - 02/09/19 1135    Subjective  No changes.    Pain Score  3     Pain Location  Back    Pain Orientation  Right    Pain Descriptors / Indicators  --   pinching   Pain Radiating Towards  lat post thigh RT.    Pain Onset  More than a month ago    Pain Frequency  Constant    Aggravating Factors   sit /lying    Pain Relieving Factors  nothing                       [facility] Adult PT Treatment/Exercise - 02/09/19 0001      Exercises   Exercises  Lumbar      Lumbar Exercises: Stretches   Single Knee to Chest Stretch  Right;Left;30 seconds     Lower Trunk Rotation  2 reps;20 seconds      Lumbar Exercises: Aerobic   Nustep  LE L4  5 min      Lumbar Exercises: Supine   Pelvic Tilt  15 reps;5 seconds    Bent Knee Raise  10 reps      Manual Therapy   Manual Therapy  Soft tissue mobilization;Joint mobilization;Passive ROM;Manual Traction    Joint Mobilization  L3 lumbar unilateral RT     Soft tissue mobilization  RT lower back    Passive ROM  TRunk rotation RT in Lt side lye x 50 reps             PT Education - 02/09/19 1153    Education Details  HEP    Person(s) Educated  Patient    Methods  Explanation  Comprehension  Verbalized understanding       PT Short Term Goals - 01/27/19 1046      PT SHORT TERM GOAL #1   Title  He will be indpendent with initiAL Hep    Baseline  NO PROGRAM    Time  2    Period  Weeks    Status  New      PT SHORT TERM GOAL #2   Title  He will report 10% decr in RT hip/back pain    Baseline  varies from mild to lower severe levels depending on time of activity/position    Time  2    Period  Weeks    Status  New        PT Long Term Goals - 01/27/19 1313      PT LONG TERM GOAL #1   Title  He will be indpendent with all HEP issued    Baseline  Indepndent with initial hEP    Time  6    Period  Weeks    Status  New      PT LONG TERM GOAL #2   Title  He will report pain decreased 50% or more in RT hip and back and be intermittant    Baseline  constant pain    Time  6    Period  Weeks    Status  New      PT LONG TERM GOAL #3   Title  He will report able to lye on rT side for short periods due to decrased pain    Baseline  unable to lye on RT side to sleep    Time  6    Period  Weeks    Status  New      PT LONG TERM GOAL #4   Title  He will report greater time sitting to 30 minor more with support  before pain starts    Baseline  5 min with weight to LT hip    Time  6    Period  Weeks    Status  New      PT LONG TERM GOAL #5   Title  He will be able to walk  without SPC in comunity    Baseline  uses SPC to ambulate out of home.    Time  6    Period  Weeks    Status  New      Additional Long Term Goals   Additional Long Term Goals  Yes      PT LONG TERM GOAL #6   Title  He will report getting to standing from sitting is 50% or more easier    Baseline  He reports difficulty getting out of chair and has falen while doing this in past    Time  6    Period  Weeks    Status  New            Plan - 02/09/19 1140    Clinical Impression Statement  Mr Macmillan  no  better but HEP initiated.    No worse with session    Personal Factors and Comorbidities  Past/Current Experience;Time since onset of injury/illness/exacerbation;Age    PT Treatment/Interventions  Dry needling;Passive range of motion;Manual techniques;Patient/family education;Therapeutic activities;Gait training;Moist Heat;Iontophoresis 54m/ml Dexamethasone;Electrical Stimulation;Therapeutic exercise    PT Next Visit Plan  Add to HEP,   Manual and modalities    PT Home Exercise Plan  Knee to chest, LTR,  piriformis, PPT,  bent knee raise    Consulted and Agree with Plan of Care  Patient       Patient will benefit from skilled therapeutic intervention in order to improve the following deficits and impairments:  Pain, Postural dysfunction, Increased muscle spasms, Decreased activity tolerance, Decreased range of motion, Decreased strength, Difficulty walking  Visit Diagnosis: Radiculopathy, lumbar region  Muscle spasm of back  Joint stiffness of spine  Abnormal posture     Problem List Patient Active Problem List   Diagnosis Date Noted  . Lumbar radiculopathy 12/04/2018  . Glaucoma 12/04/2018  . Cortical age-related cataract of both eyes 12/04/2018  . Pain management contract agreement 12/04/2018  . Lumbar herniated disc 06/04/2018  . Prediabetes 01/01/2018  . Hyperlipidemia 12/30/2017  . Obesity (BMI 30.0-34.9) 12/30/2017  . Benign neoplasm of descending colon   .  Essential hypertension 11/03/2015  . Cardiomyopathy, ischemic 06/03/2015  . Chronic systolic dysfunction of left ventricle 06/03/2015  . Erectile dysfunction due to arterial insufficiency 12/23/2014  . Lipoma of skin and subcutaneous tissue of neck 05/24/2014  . History of cardioembolic stroke 62/19/4712  . Vision loss of right eye 06/23/2013  . Dyslipidemia 06/22/2013  . 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([place])   . Coronary artery disease   . Cardiomyopathy ([place])   . Pseudoaneurysm of left ventricle of heart   . Tobacco abuse     [name] PT 02/09/2019, 12:12 PM  [facility] [address] [place], [place], [address] Phone: [phone]   Fax:  [phone]  Name: [name] MRN: [id number] Date of Birth: Nov 23, 1960

## 2019-02-09 NOTE — Patient Instructions (Signed)
PPT and bent knee raise x 10-25 reps daily   Hold PPT 5 sec

## 2019-02-11 ENCOUNTER — Ambulatory Visit: Payer: Medicare Other | Admitting: Physical Therapy

## 2019-02-11 ENCOUNTER — Other Ambulatory Visit: Payer: Self-pay

## 2019-02-11 DIAGNOSIS — R293 Abnormal posture: Secondary | ICD-10-CM | POA: Diagnosis not present

## 2019-02-11 DIAGNOSIS — M5416 Radiculopathy, lumbar region: Secondary | ICD-10-CM | POA: Diagnosis not present

## 2019-02-11 DIAGNOSIS — M62830 Muscle spasm of back: Secondary | ICD-10-CM | POA: Diagnosis not present

## 2019-02-11 DIAGNOSIS — M2560 Stiffness of unspecified joint, not elsewhere classified: Secondary | ICD-10-CM | POA: Diagnosis not present

## 2019-02-11 NOTE — Therapy (Signed)
[place], [place], [address] Phone: [phone]   Fax:  [phone]  Physical Therapy Treatment  Patient Details  Name: [name] MRN: [id number] Date of Birth: February 21, 1961 Referring Provider (PT): Emelda Brothers, MD   Encounter Date: 02/11/2019  PT End of Session - 02/11/19 1325    Visit Number  3    Number of Visits  12    Date for PT Re-Evaluation  03/13/19    Authorization Type  MCD    Authorization Time Period  until 02/22/19    Authorization - Visit Number  2    Authorization - Number of Visits  3    PT Start Time  0115    PT Stop Time  0215    PT Time Calculation (min)  60 min       Past Medical History:  Diagnosis Date  . Abnormal EKG    hx of ischemia showing up on ekg's  . Acute myopericarditis    a. 03/2013: readm with hypoxia, tachycardia, elevated ESR/CRP, elevated troponin with 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and myopericarditis; treated with steroids.  . Acute respiratory failure ([place])    a. 01/2013: VDRF. b. 03/2013: hypoxia requiring supp O2 during adm, resolved by discharge.  . C. difficile colitis    a. 01/2013 during prolonged adm. b. Recurred 03/2013.  . Cardiac tamponade    a. 01/2013 s/p drain.  . Coronary artery disease    a. s/p MI in 2009 in [place] with stenting of the LCX and LAD;  b. 12/2012 NSTEMI/CAD: LM nl, LAD patent prox stent, LCX 50-70 isr (FFR 0.84), RCA dom, 144m EF 40-45%-->Med Rx. c. 01/2013: anterolateral STEMI complicated by pericardial effusion (presumed purulent pericarditis) and tamponade s/p drain, ruptured LV pseudoaneurysm s/p CorMatrix patch, CABGx1 (SVG-OM1), fever, CVA, VDRF, C diff.  . CVA (cerebral infarction)    a. 01/2013 in setting of prolonged hospitalization - residual L arm weakness.  . 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([place]    a. 03/2013: readm with hypoxia, tachycardia, elevated ESR/CRP, elevated troponin with 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and myopericarditis; treated with  steroids.  . Hyperlipidemia   . Hypokalemia   . Hyponatremia    a. During late 2014.  . Ischemic cardiomyopathy    a. Sept/Oct 2014: EF ~40% (ICM). b. 03/2013: EF 25-30%. Off ACEI due to hypotension (MIXED NICM/ICM).  .Marland Kitchen[place]ocardial infarction ([place]  . Pericardial effusion    a. 01/2013: pericardial effusion (presumed purulent pericarditis) and cardiac tamponade s/p drain. b. Persistent moderate pericardial effusion 03/2013.  . Pre-diabetes   . Pseudoaneurysm of left ventricle of heart    a. 01/2013:  Ruptured inferoposterior LV pseudoaneurysm s/p CorMatrix patch.  . Sinus bradycardia    asymptomatic  . Stroke ([facility] 2009   weak lt side-lt arm  . Tobacco abuse    a. quit 12/2012.  . Wears dentures    top    Past Surgical History:  Procedure Laterality Date  . CARDIAC CATHETERIZATION  01/06/2013  . COLONOSCOPY WITH PROPOFOL N/A 07/03/2016   Procedure: COLONOSCOPY WITH PROPOFOL;  Surgeon: SManus Gunning MD;  Location: [facility];  Service: Gastroenterology;  Laterality: N/A;  . CORONARY ANGIOPLASTY WITH STENT PLACEMENT  2000's X 2   "1 + 1" (01/06/2013)  . CORONARY ARTERY BYPASS GRAFT N/A 01/28/2013   Procedure: CORONARY ARTERY BYPASS GRAFTING (CABG);  Surgeon: SMelrose Nakayama MD;  [place]  Service: Open Heart Surgery;  Laterality: N/A;  CABG x one, using left greater saphenous  vein harvested endoscopically  . INTRAOPERATIVE TRANSESOPHAGEAL ECHOCARDIOGRAM N/A 01/28/2013   Procedure: INTRAOPERATIVE TRANSESOPHAGEAL ECHOCARDIOGRAM;  Surgeon: Melrose Nakayama, MD;  Location: [place];  Service: Open Heart Surgery;  Laterality: N/A;  . LEFT HEART CATHETERIZATION WITH CORONARY ANGIOGRAM N/A 01/06/2013   Procedure: LEFT HEART CATHETERIZATION WITH CORONARY ANGIOGRAM;  Surgeon: Peter M Martinique, MD;  Location[facility] C[facility] LAB;  Service: Cardiovascular;  Laterality: N/A;  . LEFT HEART CATHETERIZATION WITH CORONARY ANGIOGRAM N/A 01/21/2013   Procedure: LEFT HEART  CATHETERIZATION WITH CORONARY ANGIOGRAM;  Surgeon: Burnell Blanks, MD;  Location: [facility] CATH LAB;  Service: Cardiovascular;  Laterality: N/A;  . LUMBAR LAMINECTOMY/ DECOMPRESSION WITH MET-RX Right 06/30/2018   Procedure: Right minimally invasive Lumbar Three-Four Far lateral discectomy;  Surgeon: Judith Part, MD;  Location: [place];  Service: Neurosurgery;  Laterality: Right;  Right minimally invasive Lumbar Three-Four Far lateral discectomy  . MASS EXCISION Right 07/21/2014   Procedure: EXCISION RIGHT NECK MASS;  Surgeon: Erroll Luna, MD;  Location: [place];  Service: General;  Laterality: Right;  . PERICARDIAL TAP N/A 01/24/2013   Procedure: PERICARDIAL TAP;  Surgeon: Blane Ohara, MD;  Location: [facility] CATH LAB;  Service: Cardiovascular;  Laterality: N/A;  . RIGHT HEART CATHETERIZATION Right 01/24/2013   Procedure: RIGHT HEART CATH;  Surgeon: Blane Ohara, MD;  Location: [facility] CATH LAB;  Service: Cardiovascular;  Laterality: Right;  . VENTRICULAR ANEURYSM RESECTION N/A 01/28/2013   Procedure: LEFT VENTRICULAR ANEURYSM REPAIR;  Surgeon: Melrose Nakayama, MD;  [facility];  Service: Open Heart Surgery;  Laterality: N/A;    There were no vitals filed for this visit.  Subjective Assessment - 02/11/19 1352    Subjective  Doing the exercises. About the same.    Currently in Pain?  Yes    Pain Score  5     Pain Location  Back    Pain Orientation  Right    Pain Descriptors / Indicators  Other (Comment)   pinching in back, sharp below the knee   Pain Radiating Towards  to knee and ankle right    Aggravating Factors   sit-stand    Pain Relieving Factors  heat                       [facility] Adult PT Treatment/Exercise - 02/11/19 0001      Lumbar Exercises: Stretches   Active Hamstring Stretch  3 reps;30 seconds    Active Hamstring Stretch Limitations  seated     Single Knee to Chest Stretch  Right;Left;30 seconds    Lower Trunk Rotation  2  reps;20 seconds      Lumbar Exercises: Aerobic   Nustep  UE/LE L6  5 min      Lumbar Exercises: Seated   Sit to Stand  20 reps    Sit to Stand Limitations  hands on knees to rise; no UE to lower :foam pad to elevate low mat table       Lumbar Exercises: Supine   Pelvic Tilt  15 reps;5 seconds    Pelvic Tilt Limitations  tactile, verbal and demo required     Bridge  10 reps    Isometric Hip Flexion  10 reps    Isometric Hip Flexion Limitations  and obliques x 10       Modalities   Modalities  Moist Heat      Moist Heat Therapy   Number Minutes Moist Heat  15 Minutes  Moist Heat Location  Lumbar Spine             PT Education - 02/11/19 1354    Education Details  HEP, HMP instructions    Person(s) Educated  Patient    Methods  Explanation;Handout    Comprehension  Verbalized understanding       PT Short Term Goals - 01/27/19 1046      PT SHORT TERM GOAL #1   Title  He will be indpendent with initiAL Hep    Baseline  NO PROGRAM    Time  2    Period  Weeks    Status  New      PT SHORT TERM GOAL #2   Title  He will report 10% decr in RT hip/back pain    Baseline  varies from mild to lower severe levels depending on time of activity/position    Time  2    Period  Weeks    Status  New        PT Long Term Goals - 01/27/19 1313      PT LONG TERM GOAL #1   Title  He will be indpendent with all HEP issued    Baseline  Indepndent with initial hEP    Time  6    Period  Weeks    Status  New      PT LONG TERM GOAL #2   Title  He will report pain decreased 50% or more in RT hip and back and be intermittant    Baseline  constant pain    Time  6    Period  Weeks    Status  New      PT LONG TERM GOAL #3   Title  He will report able to lye on rT side for short periods due to decrased pain    Baseline  unable to lye on RT side to sleep    Time  6    Period  Weeks    Status  New      PT LONG TERM GOAL #4   Title  He will report greater time sitting to 30  minor more with support  before pain starts    Baseline  5 min with weight to LT hip    Time  6    Period  Weeks    Status  New      PT LONG TERM GOAL #5   Title  He will be able to walk without SPC in comunity    Baseline  uses SPC to ambulate out of home.    Time  6    Period  Weeks    Status  New      Additional Long Term Goals   Additional Long Term Goals  Yes      PT LONG TERM GOAL #6   Title  He will report getting to standing from sitting is 50% or more easier    Baseline  He reports difficulty getting out of chair and has falen while doing this in past    Time  6    Period  Weeks    Status  New            Plan - 02/11/19 1419    Clinical Impression Statement  Pt deomstrated difficulty risng from chair and increased Rt low back and RLE pain. Worked on sit-stands from elevated chair focusing on eccentric control. He reports less pain with this. Began hamstring stretching  and progressed core strength. No increased pain. Cues required for correct pevic tilt and to recruit abdominals. Updated HEP.    PT Next Visit Plan  Add to HEP,   Manual and modalities; Medicaid re-auth request additional visits    PT Home Exercise Plan  Knee to chest, LTR,  piriformis, PPT, bent knee raise, seated H/s stretch, hooklying isometric hip flexion unilateral and opposites, sit-stand (Elevated)    Consulted and Agree with Plan of Care  Patient       Patient will benefit from skilled therapeutic intervention in order to improve the following deficits and impairments:  Pain, Postural dysfunction, Increased muscle spasms, Decreased activity tolerance, Decreased range of motion, Decreased strength, Difficulty walking  Visit Diagnosis: Radiculopathy, lumbar region  Muscle spasm of back  Joint stiffness of spine  Abnormal posture     Problem List Patient Active Problem List   Diagnosis Date Noted  . Lumbar radiculopathy 12/04/2018  . Glaucoma 12/04/2018  . Cortical age-related  cataract of both eyes 12/04/2018  . Pain management contract agreement 12/04/2018  . Lumbar herniated disc 06/04/2018  . Prediabetes 01/01/2018  . Hyperlipidemia 12/30/2017  . Obesity (BMI 30.0-34.9) 12/30/2017  . Benign neoplasm of descending colon   . Essential hypertension 11/03/2015  . Cardiomyopathy, ischemic 06/03/2015  . Chronic systolic dysfunction of left ventricle 06/03/2015  . Erectile dysfunction due to arterial insufficiency 12/23/2014  . Lipoma of skin and subcutaneous tissue of neck 05/24/2014  . History of cardioembolic stroke 97/67/3419  . Vision loss of right eye 06/23/2013  . Dyslipidemia 06/22/2013  . 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([place])   . Coronary artery disease   . Cardiomyopathy ([place])   . Pseudoaneurysm of left ventricle of heart   . Tobacco abuse     Dorene Ar, [place] 02/11/2019, 2:22 PM  [facility] [address] [place], [place], [address] Phone: [phone]   Fax:  [phone]  Name: [name] MRN: [id number] Date of Birth: 09-Jul-1960

## 2019-02-12 ENCOUNTER — Ambulatory Visit (INDEPENDENT_AMBULATORY_CARE_PROVIDER_SITE_OTHER): Payer: Medicare Other | Admitting: Pharmacist Clinician (PhC)/ Clinical Pharmacy Specialist

## 2019-02-12 DIAGNOSIS — E785 Hyperlipidemia, unspecified: Secondary | ICD-10-CM | POA: Diagnosis not present

## 2019-02-12 DIAGNOSIS — Z720 Tobacco use: Secondary | ICD-10-CM | POA: Diagnosis not present

## 2019-02-12 DIAGNOSIS — R0602 Shortness of breath: Secondary | ICD-10-CM | POA: Diagnosis not present

## 2019-02-12 DIAGNOSIS — I519 Heart disease, unspecified: Secondary | ICD-10-CM | POA: Diagnosis not present

## 2019-02-12 DIAGNOSIS — R7303 Prediabetes: Secondary | ICD-10-CM | POA: Diagnosis not present

## 2019-02-12 DIAGNOSIS — I25810 Atherosclerosis of coronary artery bypass graft(s) without angina pectoris: Secondary | ICD-10-CM | POA: Diagnosis not present

## 2019-02-12 DIAGNOSIS — I255 Ischemic cardiomyopathy: Secondary | ICD-10-CM | POA: Diagnosis not present

## 2019-02-12 DIAGNOSIS — Z8673 Personal history of transient ischemic attack (TIA), and cerebral infarction without residual deficits: Secondary | ICD-10-CM | POA: Diagnosis not present

## 2019-02-12 DIAGNOSIS — I5022 Chronic systolic (congestive) heart failure: Secondary | ICD-10-CM | POA: Diagnosis not present

## 2019-02-12 NOTE — Progress Notes (Signed)
02/13/2019 [name] 09-05-1960 [id number]   HPI:  [name] is a 58 y.o. male patient of Dr Martinique, with a PMH below who presents today for heart failure medication evaluation (HFrEF).   See pertinent past medical history below.   Patient was seen in virtual visit with Dr. Martinique earlier this month.   An Echo done on Oct 10 shows EF to be 25-30%.   Patient has been on lisinopril and carvedilol, Dr Martinique would like to convert him to [facility] and titrate to maximum tolerated dose.    Today patient reports feeling well.  No complaints of chest pain or shortness of breath.  He would like to quit smoking and is asking for advice on best way to quit.   Past Medical History: ASCVD Stents to LAD, left circumflex in 2007, CABG x 1 (SVG to OM 2014)  hypertension Per flowsheets looks well controlled on carvedilol 25 bid, lisinopril 10 bid  hyperlipidemia (9/19) TC 158, TG 89, HDL 57, LDL 83 - on atorvastatin 80  Cardioembolic stroke After LV pseudoaneurysm rupture   Pre-diabetes (6/19) A1c 6.1  glaucoma Alphagan, latanoprost     Blood Pressure Goal:  130/80  Current Medications:  Carvedilol 25 mg bid, lisinopril 10 mg bid  Family Hx: mother - died at MI at 55; father died from [place]  Siblings all healthy  Children healthy   Social Hx: smoking 1 ppd; no alcohol, only rare soda, mostly juices  Diet: some take out; occaisonal deep fried chicken; plenty of vegetables; snacks on cookies  Exercise: not much, too much SOB, some limitations with walking (cane), just started PT  Home BP readings: no home meter, will reach out to social work to see if they can provide one  Intolerances: nkda  Labs: 02/12/19:  Na 140, K 4.0, Glu 79, BUN 14, SCr 0.98  Wt Readings from Last 3 Encounters:  02/12/19 246 lb (111.6 kg)  02/04/19 241 lb (109.3 kg)  12/04/18 237 lb 6.4 oz (107.7 kg)   BP Readings from Last 3 Encounters:  02/12/19 134/84  12/04/18 138/90  11/07/18 (!)  151/96   Pulse Readings from Last 3 Encounters:  02/12/19 67  12/04/18 64  11/07/18 61    Current Outpatient Medications  Medication Sig Dispense Refill  . ALPHAGAN P 0.1 % SOLN Place 1 drop into both eyes 2 (two) times daily.    Marland Kitchen aspirin 81 MG EC tablet Take 1 tablet (81 mg total) by mouth daily. Restart on 07/05/2018 90 tablet 3  . atorvastatin (LIPITOR) 80 MG tablet Take 1 tablet (80 mg total) by mouth daily. 30 tablet 3  . carvedilol (COREG) 25 MG tablet Take 1 tablet (25 mg total) by mouth 2 (two) times daily with a meal. NEED OV. 60 tablet 5  . Cholecalciferol (VITAMIN D) 2000 units tablet Take 2,000 Units by mouth daily.    Marland Kitchen latanoprost (XALATAN) 0.005 % ophthalmic solution Place 1 drop into both eyes daily.  12  . lisinopril (ZESTRIL) 10 MG tablet Take 1 tablet (10 mg total) by mouth 2 (two) times daily. 60 tablet 3  . Multiple Vitamin (MULTIVITAMIN WITH MINERALS) TABS tablet Take 1 tablet by mouth daily.    . nitroGLYCERIN (NITROSTAT) 0.4 MG SL tablet Place 1 tablet (0.4 mg total) under the tongue every 5 (five) minutes as needed for chest pain (up to 3 doses). 25 tablet 0  . nicotine (NICODERM CQ - DOSED IN MG/24 HOURS) 14 mg/24hr patch  Place 1 patch (14 mg total) onto the skin daily. 14 patch 0  . nicotine (NICODERM CQ - DOSED IN MG/24 HOURS) 21 mg/24hr patch Place 1 patch (21 mg total) onto the skin daily. 14 patch 2  . nicotine (NICODERM CQ - DOSED IN MG/24 HR) 7 mg/24hr patch Place 1 patch (7 mg total) onto the skin daily. 14 patch 0   No current facility-administered medications for this visit.     No Known Allergies  Past Medical History:  Diagnosis Date  . Abnormal EKG    hx of ischemia showing up on ekg's  . Acute myopericarditis    a. 03/2013: readm with hypoxia, tachycardia, elevated ESR/CRP, elevated troponin with 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and myopericarditis; treated with steroids.  . Acute respiratory failure ([place])    a. 01/2013: VDRF. b. 03/2013: hypoxia requiring  supp O2 during adm, resolved by discharge.  . C. difficile colitis    a. 01/2013 during prolonged adm. b. Recurred 03/2013.  . Cardiac tamponade    a. 01/2013 s/p drain.  . Coronary artery disease    a. s/p MI in 2009 in [place] with stenting of the LCX and LAD;  b. 12/2012 NSTEMI/CAD: LM nl, LAD patent prox stent, LCX 50-70 isr (FFR 0.84), RCA dom, 155m EF 40-45%-->Med Rx. c. 01/2013: anterolateral STEMI complicated by pericardial effusion (presumed purulent pericarditis) and tamponade s/p drain, ruptured LV pseudoaneurysm s/p CorMatrix patch, CABGx1 (SVG-OM1), fever, CVA, VDRF, C diff.  . CVA (cerebral infarction)    a. 01/2013 in setting of prolonged hospitalization - residual L arm weakness.  . 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([place]    a. 03/2013: readm with hypoxia, tachycardia, elevated ESR/CRP, elevated troponin with 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and myopericarditis; treated with steroids.  . Hyperlipidemia   . Hypokalemia   . Hyponatremia    a. During late 2014.  . Ischemic cardiomyopathy    a. Sept/Oct 2014: EF ~40% (ICM). b. 03/2013: EF 25-30%. Off ACEI due to hypotension (MIXED NICM/ICM).  .[place]Myocardial infarction ([place]  . Pericardial effusion    a. 01/2013: pericardial effusion (presumed purulent pericarditis) and cardiac tamponade s/p drain. b. Persistent moderate pericardial effusion 03/2013.  . Pre-diabetes   . Pseudoaneurysm of left ventricle of heart    a. 01/2013:  Ruptured inferoposterior LV pseudoaneurysm s/p CorMatrix patch.  . Sinus bradycardia    asymptomatic  . Stroke ([facility] 2009   weak lt side-lt arm  . Tobacco abuse    a. quit 12/2012.  . Wears dentures    top    Blood pressure 134/84, pulse 67, resp. rate 15, height 6' (1.829 m), weight 246 lb (111.6 kg), SpO2 98 %.  Chronic systolic dysfunction of left ventricle Patient with HFrEF, last measured at 25-30%, currently on lisinopril and carvedilol.   Will have him discontinue lisinopril (last dose was this morning) and start Entresto  49/51 mg twice daily, with the first dose scheduled for Saturday morning.  He was given a 2 week sample of medication and will return at that time for follow up and possible titration.  Will also reach out to social work team to see about getting a home BP monitor for him.    Tobacco abuse Patient is asking about ways to quit tobacco today.  Reviewed options including gum, patches or medication.  Patient willing to start with patches.  Reviewed information on dosing and time of therapy.  All questions answered.     KTommy MedalPharmD CPP [facility] [address]  [place], [place] [address] [phone]

## 2019-02-12 NOTE — Patient Instructions (Signed)
Pick a quit date for smoking.  On your first quit day apply the 21 mg patch in the morning and remove it at bedtime.  You will use the 21 mg patches for 6 weeks, then 2-4 weeks of each 14 and 7 mg patches  No more lisinopril.  Start Entresto 49/51 mg twice daily, with the first dose being Saturday morning.    Sacubitril; Valsartan oral tablet What is this medicine? SACUBITRIL; VALSARTAN (sak UE bi tril; val SAR tan) is a combination of 2 drugs used to reduce the risk of death and hospitalizations in people with long-lasting heart failure. It is usually used with other medicines to treat heart failure. This medicine may be used for other purposes; ask your health care provider or pharmacist if you have questions. COMMON BRAND NAME(S): Entresto What should I tell my health care provider before I take this medicine? They need to know if you have any of these conditions:  diabetes and take a medicine that contains aliskiren  kidney disease  liver disease  an unusual or allergic reaction to sacubitril; valsartan, drugs called angiotensin converting enzyme (ACE) inhibitors, angiotensin II receptor blockers (ARBs), other medicines, foods, dyes, or preservatives  pregnant or trying to get pregnant  breast-feeding How should I use this medicine? Take this medicine by mouth with a glass of water. Follow the directions on the prescription label. You can take it with or without food. If it upsets your stomach, take it with food. Take your medicine at regular intervals. Do not take it more often than directed. Do not stop taking except on your doctor's advice. Do not take this medicine for at least 36 hours before or after you take an ACE inhibitor medicine. Talk to your health care provider if you are not sure if you take an ACE inhibitor. Talk to your pediatrician regarding the use of this medicine in children. Special care may be needed. Overdosage: If you think you have taken too much of this  medicine contact a poison control center or emergency room at once. NOTE: This medicine is only for you. Do not share this medicine with others. What if I miss a dose? If you miss a dose, take it as soon as you can. If it is almost time for next dose, take only that dose. Do not take double or extra doses. What may interact with this medicine? Do not take this medicine with any of the following medicines:  aliskiren if you have diabetes  angiotensin-converting enzyme (ACE) inhibitors, like benazepril, captopril, enalapril, fosinopril, lisinopril, or ramipril This medicine may also interact with the following medicines:  angiotensin II receptor blockers (ARBs) like azilsartan, candesartan, eprosartan, irbesartan, losartan, olmesartan, telmisartan, or valsartan  lithium  NSAIDS, medicines for pain and inflammation, like ibuprofen or naproxen  potassium-sparing diuretics like amiloride, spironolactone, and triamterene  potassium supplements This list may not describe all possible interactions. Give your health care provider a list of all the medicines, herbs, non-prescription drugs, or dietary supplements you use. Also tell them if you smoke, drink alcohol, or use illegal drugs. Some items may interact with your medicine. What should I watch for while using this medicine? Tell your doctor or healthcare professional if your symptoms do not start to get better or if they get worse. Do not become pregnant while taking this medicine. Women should inform their doctor if they wish to become pregnant or think they might be pregnant. There is a potential for serious side effects to an unborn  child. Talk to your health care professional or pharmacist for more information. You may get dizzy. Do not drive, use machinery, or do anything that needs mental alertness until you know how this medicine affects you. Do not stand or sit up quickly, especially if you are an older patient. This reduces the risk of  dizzy or fainting spells. Avoid alcoholic drinks; they can make you more dizzy. What side effects may I notice from receiving this medicine? Side effects that you should report to your doctor or health care professional as soon as possible:  allergic reactions like skin rash, itching or hives, swelling of the face, lips, or tongue  signs and symptoms of increased potassium like muscle weakness; chest pain; or fast, irregular heartbeat  signs and symptoms of kidney injury like trouble passing urine or change in the amount of urine  signs and symptoms of low blood pressure like feeling dizzy or lightheaded, or if you develop extreme fatigue Side effects that usually do not require medical attention (report to your doctor or health care professional if they continue or are bothersome):  cough This list may not describe all possible side effects. Call your doctor for medical advice about side effects. You may report side effects to FDA at 1-800-FDA-1088. Where should I keep my medicine? Keep out of the reach of children. Store at room temperature between 15 and 30 degrees C (59 and 86 degrees F). Throw away any unused medicine after the expiration date. NOTE: This sheet is a summary. It may not cover all possible information. If you have questions about this medicine, talk to your doctor, pharmacist, or health care provider.  2020 Elsevier/Gold Standard (2015-05-18 13:54:19)

## 2019-02-13 ENCOUNTER — Other Ambulatory Visit: Payer: Self-pay

## 2019-02-13 LAB — LIPID PANEL
Chol/HDL Ratio: 2.5 ratio (ref 0.0–5.0)
Cholesterol, Total: 134 mg/dL (ref 100–199)
HDL: 53 mg/dL (ref >39)
LDL Chol Calc (NIH): 62 mg/dL (ref 0–99)
Triglycerides: 100 mg/dL (ref 0–149)
VLDL Cholesterol Cal: 19 mg/dL (ref 5–40)

## 2019-02-13 LAB — COMPREHENSIVE METABOLIC PANEL
ALT: 21 IU/L (ref 0–44)
AST: 18 IU/L (ref 0–40)
Albumin/Globulin Ratio: 1.9 (ref 1.2–2.2)
Albumin: 4.4 g/dL (ref 3.8–4.9)
Alkaline Phosphatase: 155 IU/L — ABNORMAL HIGH (ref 39–117)
BUN/Creatinine Ratio: 14 (ref 9–20)
BUN: 14 mg/dL (ref 6–24)
Bilirubin Total: 0.4 mg/dL (ref 0.0–1.2)
CO2: 21 mmol/L (ref 20–29)
Calcium: 9.3 mg/dL (ref 8.7–10.2)
Chloride: 106 mmol/L (ref 96–106)
Creatinine, Ser: 0.98 mg/dL (ref 0.76–1.27)
GFR calc Af Amer: 99 mL/min/{1.73_m2} (ref >59)
GFR calc non Af Amer: 85 mL/min/{1.73_m2} (ref >59)
Globulin, Total: 2.3 g/dL (ref 1.5–4.5)
Glucose: 79 mg/dL (ref 65–99)
Potassium: 4 mmol/L (ref 3.5–5.2)
Sodium: 140 mmol/L (ref 134–144)
Total Protein: 6.7 g/dL (ref 6.0–8.5)

## 2019-02-13 LAB — HEMOGLOBIN A1C
Est. average glucose Bld gHb Est-mCnc: 123 mg/dL
Hgb A1c MFr Bld: 5.9 % — ABNORMAL HIGH (ref 4.8–5.6)

## 2019-02-13 LAB — MAGNESIUM: Magnesium: 2.1 mg/dL (ref 1.6–2.3)

## 2019-02-13 LAB — BRAIN NATRIURETIC PEPTIDE: BNP: 384.2 pg/mL — ABNORMAL HIGH (ref 0.0–100.0)

## 2019-02-13 MED ORDER — NICOTINE 14 MG/24HR TD PT24: 14.0000 mg | MEDICATED_PATCH | Freq: Every day | TRANSDERMAL | 0 refills | Status: DC

## 2019-02-13 MED ORDER — SACUBITRIL-VALSARTAN 49-51 MG PO TABS: 1.0000 | ORAL_TABLET | Freq: Two times a day (BID) | ORAL | 0 refills | Status: DC

## 2019-02-13 MED ORDER — NICOTINE 21 MG/24HR TD PT24: 21.0000 mg | MEDICATED_PATCH | Freq: Every day | TRANSDERMAL | 2 refills | Status: DC

## 2019-02-13 MED ORDER — NICOTINE 7 MG/24HR TD PT24: 7.0000 mg | MEDICATED_PATCH | Freq: Every day | TRANSDERMAL | 0 refills | Status: DC

## 2019-02-13 NOTE — Assessment & Plan Note (Signed)
Patient is asking about ways to quit tobacco today.  Reviewed options including gum, patches or medication.  Patient willing to start with patches.  Reviewed information on dosing and time of therapy.  All questions answered.

## 2019-02-13 NOTE — Assessment & Plan Note (Signed)
Patient with HFrEF, last measured at 25-30%, currently on lisinopril and carvedilol.   Will have him discontinue lisinopril (last dose was this morning) and start Entresto 49/51 mg twice daily, with the first dose scheduled for Saturday morning.  He was given a 2 week sample of medication and will return at that time for follow up and possible titration.  Will also reach out to social work team to see about getting a home BP monitor for him.

## 2019-02-16 ENCOUNTER — Encounter: Payer: Self-pay | Admitting: Internal Medicine

## 2019-02-16 ENCOUNTER — Ambulatory Visit: Payer: Medicare Other | Attending: Neurological Surgery

## 2019-02-16 ENCOUNTER — Other Ambulatory Visit: Payer: Self-pay

## 2019-02-16 ENCOUNTER — Ambulatory Visit: Payer: Medicare Other | Attending: Internal Medicine | Admitting: Internal Medicine

## 2019-02-16 VITALS — BP 133/86 | HR 62 | Temp 98.2°F | Resp 16 | Wt 243.0 lb

## 2019-02-16 DIAGNOSIS — Z8673 Personal history of transient ischemic attack (TIA), and cerebral infarction without residual deficits: Secondary | ICD-10-CM | POA: Insufficient documentation

## 2019-02-16 DIAGNOSIS — Z20822 Contact with and (suspected) exposure to covid-19: Secondary | ICD-10-CM

## 2019-02-16 DIAGNOSIS — Z20828 Contact with and (suspected) exposure to other viral communicable diseases: Secondary | ICD-10-CM

## 2019-02-16 DIAGNOSIS — M62830 Muscle spasm of back: Secondary | ICD-10-CM

## 2019-02-16 DIAGNOSIS — Z79899 Other long term (current) drug therapy: Secondary | ICD-10-CM | POA: Diagnosis not present

## 2019-02-16 DIAGNOSIS — Z833 Family history of diabetes mellitus: Secondary | ICD-10-CM | POA: Diagnosis not present

## 2019-02-16 DIAGNOSIS — Z6832 Body mass index (BMI) 32.0-32.9, adult: Secondary | ICD-10-CM | POA: Diagnosis not present

## 2019-02-16 DIAGNOSIS — H409 Unspecified glaucoma: Secondary | ICD-10-CM

## 2019-02-16 DIAGNOSIS — I255 Ischemic cardiomyopathy: Secondary | ICD-10-CM | POA: Insufficient documentation

## 2019-02-16 DIAGNOSIS — E669 Obesity, unspecified: Secondary | ICD-10-CM | POA: Diagnosis not present

## 2019-02-16 DIAGNOSIS — Z951 Presence of aortocoronary bypass graft: Secondary | ICD-10-CM | POA: Diagnosis not present

## 2019-02-16 DIAGNOSIS — R293 Abnormal posture: Secondary | ICD-10-CM | POA: Diagnosis not present

## 2019-02-16 DIAGNOSIS — E785 Hyperlipidemia, unspecified: Secondary | ICD-10-CM | POA: Insufficient documentation

## 2019-02-16 DIAGNOSIS — M5416 Radiculopathy, lumbar region: Secondary | ICD-10-CM | POA: Insufficient documentation

## 2019-02-16 DIAGNOSIS — M5116 Intervertebral disc disorders with radiculopathy, lumbar region: Secondary | ICD-10-CM | POA: Insufficient documentation

## 2019-02-16 DIAGNOSIS — F17210 Nicotine dependence, cigarettes, uncomplicated: Secondary | ICD-10-CM | POA: Diagnosis not present

## 2019-02-16 DIAGNOSIS — I10 Essential (primary) hypertension: Secondary | ICD-10-CM | POA: Insufficient documentation

## 2019-02-16 DIAGNOSIS — Z955 Presence of coronary angioplasty implant and graft: Secondary | ICD-10-CM | POA: Insufficient documentation

## 2019-02-16 DIAGNOSIS — I25810 Atherosclerosis of coronary artery bypass graft(s) without angina pectoris: Secondary | ICD-10-CM | POA: Insufficient documentation

## 2019-02-16 DIAGNOSIS — Z7982 Long term (current) use of aspirin: Secondary | ICD-10-CM | POA: Diagnosis not present

## 2019-02-16 DIAGNOSIS — R7303 Prediabetes: Secondary | ICD-10-CM | POA: Insufficient documentation

## 2019-02-16 DIAGNOSIS — Z8249 Family history of ischemic heart disease and other diseases of the circulatory system: Secondary | ICD-10-CM | POA: Diagnosis not present

## 2019-02-16 DIAGNOSIS — M2560 Stiffness of unspecified joint, not elsewhere classified: Secondary | ICD-10-CM

## 2019-02-16 MED ORDER — ALPHAGAN P 0.1 % OP SOLN: 1.0000 [drp] | Freq: Two times a day (BID) | OPHTHALMIC | 0 refills | Status: DC

## 2019-02-16 NOTE — Progress Notes (Signed)
Patient ID: [name] Tucker, male    DOB: November 20, 1960  MRN: [id number]  CC: Follow-up (4 Tucker)   Subjective: [name] Tucker is [name] 58 y.o. male who presents for 13-monthfollow-up His concerns today include:  Patient with history of CAD, ICM, HL, HTN, CVA, tob dep, obesity, ruptured left ventricular pseudoaneurysm requiring emergency surgery, CABG x1.   Lumbar radiculooathy: On last visit with me, patient complained of low back pain associated with numbness and pain in the right leg that had persisted post lumbar discectomy in March of this year.  He was trying to get his disability reinstated.  Since last visit, patient tells me that he did get Medicaid and will have Medicare the beginning of next year.  -I had referred him back to [name] Tucker  He states that he spoke with them but was not able to see them because he owes them some money.  However he states that they did refer him for physical therapy.  He has been going once [name] Tucker and has had 3 sessions so far and has found it helpful.  He is requesting [name] refill on one of his eyedrops for glaucoma.  He used to see [name] Tucker  He tried to get back in with him but also has an outstanding bill there which he is unable to pay off.  Patient had requested COVID-19 testing.  He denies having any symptoms or any recent exposures.  He apparently had asthma CMA about having this done prior to me evaluating him and the test had already been performed Patient Active Problem List   Diagnosis Date Noted  . Lumbar radiculopathy 12/04/2018  . Glaucoma 12/04/2018  . Cortical age-related cataract of both eyes 12/04/2018  . Pain management contract agreement 12/04/2018  . Lumbar herniated disc 06/04/2018  . Prediabetes 01/01/2018  . Hyperlipidemia 12/30/2017  . Obesity (BMI 30.0-34.9) 12/30/2017  . Benign neoplasm of descending colon   . Essential hypertension 11/03/2015  . Cardiomyopathy, ischemic 06/03/2015  . Chronic systolic dysfunction of  left ventricle 06/03/2015  . Erectile dysfunction due to arterial insufficiency 12/23/2014  . Lipoma of skin and subcutaneous tissue of neck 05/24/2014  . History of cardioembolic stroke [id number] . Vision loss of right eye 06/23/2013  . Dyslipidemia 06/22/2013  . 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([place]   . Coronary artery disease   . Cardiomyopathy ([place]   . Pseudoaneurysm of left ventricle of heart   . Tobacco abuse      Current Outpatient Medications on File Prior to Visit  Medication Sig Dispense Refill  . ALPHAGAN P 0.1 % SOLN Place 1 drop into both eyes 2 (two) times daily.    .Marland Kitchenaspirin 81 MG EC tablet Take 1 tablet (81 mg total) by mouth daily. Restart on 07/05/2018 90 tablet 3  . atorvastatin (LIPITOR) 80 MG tablet Take 1 tablet (80 mg total) by mouth daily. 30 tablet 3  . carvedilol (COREG) 25 MG tablet Take 1 tablet (25 mg total) by mouth 2 (two) times daily with [name] meal. NEED OV. 60 tablet 5  . Cholecalciferol (VITAMIN D) 2000 units tablet Take 2,000 Units by mouth daily.    .Marland Kitchenlatanoprost (XALATAN) 0.005 % ophthalmic solution Place 1 drop into both eyes daily.  12  . Multiple Vitamin (MULTIVITAMIN WITH MINERALS) TABS tablet Take 1 tablet by mouth daily.    . nicotine (NICODERM CQ - DOSED IN MG/24 HOURS) 14 mg/24hr patch Place 1 patch (14 mg total) onto the skin daily. 1Andrew  patch 0  . nicotine (NICODERM CQ - DOSED IN MG/24 HOURS) 21 mg/24hr patch Place 1 patch (21 mg total) onto the skin daily. 14 patch 2  . nicotine (NICODERM CQ - DOSED IN MG/24 HR) 7 mg/24hr patch Place 1 patch (7 mg total) onto the skin daily. 14 patch 0  . nitroGLYCERIN (NITROSTAT) 0.4 MG SL tablet Place 1 tablet (0.4 mg total) under the tongue every 5 (five) minutes as needed for chest pain (up to 3 doses). 25 tablet 0  . sacubitril-valsartan (ENTRESTO) 49-51 MG Take 1 tablet by mouth 2 (two) times daily. 28 tablet 0   No current facility-administered medications on file prior to visit.     No Known Allergies  Social  History   Socioeconomic History  . Marital status: Single    Spouse name: Not on file  . Number of children: 2  . Years of education: 12th  . Highest education level: Not on file  Occupational History  . Occupation: N/[name]  Social Needs  . Financial resource strain: Not on file  . Food insecurity    Worry: Not on file    Inability: Not on file  . Transportation needs    Medical: Not on file    Non-medical: Not on file  Tobacco Use  . Smoking status: Current Every Day Smoker    Packs/day: 1.50    Years: 35.00    Pack years: 52.50    Types: Cigarettes  . Smokeless tobacco: Never Used  Substance and Sexual Activity  . Alcohol use: No    Alcohol/Tucker: 0.0 standard drinks  . Drug use: No  . Sexual activity: Yes  Lifestyle  . Physical activity    Days per Tucker: Not on file    Minutes per session: Not on file  . Stress: Not on file  Relationships  . Social Herbalist on phone: Not on file    Gets together: Not on file    Attends religious service: Not on file    Active member of club or organization: Not on file    Attends meetings of clubs or organizations: Not on file    Relationship status: Not on file  . Intimate partner violence    Fear of current or ex partner: Not on file    Emotionally abused: Not on file    Physically abused: Not on file    Forced sexual activity: Not on file  Other Topics Concern  . Not on file  Social History Narrative   Lives in [place].  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  Quit smoking after MI 12/2012.   Caffeine Use: very rarely    Family History  Problem Relation Age of Onset  . Heart attack Mother   . Diabetes Mother   . Hypertension Mother   . Hypertension Father   . CAD Other   . HIV Brother   . Stroke Neg Hx     Past Surgical History:  Procedure Laterality Date  . CARDIAC CATHETERIZATION  01/06/2013  . COLONOSCOPY WITH PROPOFOL N/[name] 07/03/2016   Procedure: COLONOSCOPY WITH PROPOFOL;  Surgeon: Robert Gunning, MD;  Location: [facility];  Service: Gastroenterology;  Laterality: N/[name];  . CORONARY ANGIOPLASTY WITH STENT PLACEMENT  2000's X 2   "1 + 1" (01/06/2013)  . CORONARY ARTERY BYPASS GRAFT N/[name] 01/28/2013   Procedure: CORONARY ARTERY BYPASS GRAFTING (CABG);  Surgeon: Melrose Nakayama, MD;  Location: [place];  Service: Open Heart  Surgery;  Laterality: N/[name];  CABG x one, using left greater saphenous vein harvested endoscopically  . INTRAOPERATIVE TRANSESOPHAGEAL ECHOCARDIOGRAM N/[name] 01/28/2013   Procedure: INTRAOPERATIVE TRANSESOPHAGEAL ECHOCARDIOGRAM;  Surgeon: Melrose Nakayama, MD;  Location: [place];  Service: Open Heart Surgery;  Laterality: N/[name];  . LEFT HEART CATHETERIZATION WITH CORONARY ANGIOGRAM N/[name] 01/06/2013   Procedure: LEFT HEART CATHETERIZATION WITH CORONARY ANGIOGRAM;  Surgeon: Peter M Martinique, MD;  [facility] CATH LAB;  Service: Cardiovascular;  Laterality: N/[name];  . LEFT HEART CATHETERIZATION WITH CORONARY ANGIOGRAM N/[name] 01/21/2013   Procedure: LEFT HEART CATHETERIZATION WITH CORONARY ANGIOGRAM;  Surgeon: Burnell Blanks, MD;  Location: [facility] CATH LAB;  Service: Cardiovascular;  Laterality: N/[name];  . LUMBAR LAMINECTOMY/ DECOMPRESSION WITH MET-RX Right 06/30/2018   Procedure: Right minimally invasive Lumbar Three-Four Far lateral discectomy;  Surgeon: Judith Part, MD;  Location: [place];  Service: Neurosurgery;  Laterality: Right;  Right minimally invasive Lumbar Three-Four Far lateral discectomy  . MASS EXCISION Right 07/21/2014   Procedure: EXCISION RIGHT NECK MASS;  Surgeon: Erroll Luna, MD;  Location: [place];  Service: General;  Laterality: Right;  . PERICARDIAL TAP N/[name] 01/24/2013   Procedure: PERICARDIAL TAP;  Surgeon: Blane Ohara, MD;  Location: [facility] CATH LAB;  Service: Cardiovascular;  Laterality: N/[name];  . RIGHT HEART CATHETERIZATION Right 01/24/2013   Procedure: RIGHT HEART CATH;  Surgeon: Blane Ohara, MD;  Location: [facility] CATH LAB;   Service: Cardiovascular;  Laterality: Right;  . VENTRICULAR ANEURYSM RESECTION N/[name] 01/28/2013   Procedure: LEFT VENTRICULAR ANEURYSM REPAIR;  Surgeon: Melrose Nakayama, MD;  Location: [place];  Service: Open Heart Surgery;  Laterality: N/[name];    ROS: Review of Systems Negative except as stated above  PHYSICAL EXAM: BP 133/86   Pulse 62   Temp 98.2 F (36.8 C) (Oral)   Resp 16   Wt 243 lb (110.2 kg)   SpO2 97%   BMI 32.96 kg/m   Physical Exam General appearance - alert, well appearing, and in no distress Mental status - normal mood, behavior, speech, dress, motor activity, and thought processes MSK: Patient has [name] cane CMP Latest Ref Rng & Units 02/12/2019 06/30/2018 03/29/2018  Glucose 65 - 99 mg/dL 79 92 103(H)  BUN 6 - 24 mg/dL _0 Creatinine 0.76 - 1.27 mg/dL 0.98 0.85 0.96  Sodium 134 - 144 mmol/L 140 138 138  Potassium 3.5 - 5.2 mmol/L 4.0 3.8 4.1  Chloride 96 - 106 mmol/L 106 106 104  CO2 20 - 29 mmol/L _1 Calcium 8.7 - 10.2 mg/dL 9.3 9.5 9.8  Total Protein 6.0 - 8.5 g/dL 6.7 - 7.4  Total Bilirubin 0.0 - 1.2 mg/dL 0.4 - 0.9  Alkaline Phos 39 - 117 IU/L 155(H) - 124  AST 0 - 40 IU/L 18 - 22  ALT 0 - 44 IU/L 21 - 23   Lipid Panel     Component Value Date/Time   CHOL 134 02/12/2019 1436   TRIG 100 02/12/2019 1436   HDL 53 02/12/2019 1436   CHOLHDL 2.5 02/12/2019 1436   CHOLHDL 2.5 11/03/2015 1609   VLDL 19 11/03/2015 1609   LDLCALC 62 02/12/2019 1436    CBC    Component Value Date/Time   WBC 5.3 06/30/2018 1139   RBC 5.04 06/30/2018 1139   HGB 14.6 06/30/2018 1139   HGB 14.8 12/30/2017 1450   HCT 44.3 06/30/2018 1139   HCT 43.4 12/30/2017 1450   PLT 142 (L) 06/30/2018 1139  PLT 154 12/30/2017 1450   MCV 87.9 06/30/2018 1139   MCV 85 12/30/2017 1450   MCH 29.0 06/30/2018 1139   MCHC 33.0 06/30/2018 1139   RDW 13.1 06/30/2018 1139   RDW 12.8 12/30/2017 1450   LYMPHSABS 1.9 03/29/2018 1333   MONOABS 0.7 03/29/2018 1333   EOSABS 0.1  03/29/2018 1333   BASOSABS 0.0 03/29/2018 1333    ASSESSMENT AND PLAN:  1. Lumbar radiculopathy Patient reports some improvement of symptoms with physical therapy.  He hopes once he gets Medicare the beginning of next year he would be able to get in with Dr. Venetia Tucker  2. Encounter for screening laboratory testing for COVID-19 virus Testing was done before I was able to see and evaluate patient.  He is asymptomatic and has not had any exposure. - Novel Coronavirus, NAA ([facility])  3. Glaucoma, unspecified glaucoma type, unspecified laterality - Ambulatory referral to Ophthalmology Advised patient that I would give [name] one-time refill on the Alphagan eyedrops but he needs to get in with an ophthalmologist. - ALPHAGAN P 0.1 % SOLN; Place 1 drop into both eyes 2 (two) times daily.  Dispense: 10 mL; Refill: 0   Patient was given the opportunity to ask questions.  Patient verbalized understanding of the plan and was able to repeat key elements of the plan.   Orders Placed This Encounter  Procedures  . Novel Coronavirus, NAA ([facility])     Requested Prescriptions    No prescriptions requested or ordered in this encounter    No follow-ups on file.  Karle Plumber, MD, FACP

## 2019-02-16 NOTE — Therapy (Signed)
[place], [place], [address] Phone: [phone]   Fax:  [phone]  Physical Therapy Treatment  Patient Details  Name: [name] MRN: [id number] Date of Birth: October 11, 1960 Referring Provider (PT): Emelda Brothers, MD   Encounter Date: 02/16/2019  PT End of Session - 02/16/19 1014    Visit Number  4    Number of Visits  12    Date for PT Re-Evaluation  03/13/19    Authorization Type  MCD    Authorization Time Period  until 02/22/19    Authorization - Visit Number  3    Authorization - Number of Visits  3    PT Start Time  0915    PT Stop Time  1015    PT Time Calculation (min)  60 min    Activity Tolerance  Patient tolerated treatment well    Behavior During Therapy  [facility] for tasks assessed/performed       Past Medical History:  Diagnosis Date  . Abnormal EKG    hx of ischemia showing up on ekg's  . Acute myopericarditis    a. 03/2013: readm with hypoxia, tachycardia, elevated ESR/CRP, elevated troponin with 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and myopericarditis; treated with steroids.  . Acute respiratory failure ([place])    a. 01/2013: VDRF. b. 03/2013: hypoxia requiring supp O2 during adm, resolved by discharge.  . C. difficile colitis    a. 01/2013 during prolonged adm. b. Recurred 03/2013.  . Cardiac tamponade    a. 01/2013 s/p drain.  . Coronary artery disease    a. s/p MI in 2009 in [place] with stenting of the LCX and LAD;  b. 12/2012 NSTEMI/CAD: LM nl, LAD patent prox stent, LCX 50-70 isr (FFR 0.84), RCA dom, 180m EF 40-45%-->Med Rx. c. 01/2013: anterolateral STEMI complicated by pericardial effusion (presumed purulent pericarditis) and tamponade s/p drain, ruptured LV pseudoaneurysm s/p CorMatrix patch, CABGx1 (SVG-OM1), fever, CVA, VDRF, C diff.  . CVA (cerebral infarction)    a. 01/2013 in setting of prolonged hospitalization - residual L arm weakness.  . 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[place]    a. 03/2013: readm with  hypoxia, tachycardia, elevated ESR/CRP, elevated troponin with 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and myopericarditis; treated with steroids.  . Hyperlipidemia   . Hypokalemia   . Hyponatremia    a. During late 2014.  . Ischemic cardiomyopathy    a. Sept/Oct 2014: EF ~40% (ICM). b. 03/2013: EF 25-30%. Off ACEI due to hypotension (MIXED NICM/ICM).  .Marland KitchenMyocardial infarction ([place] 2009  . Pericardial effusion    a. 01/2013: pericardial effusion (presumed purulent pericarditis) and cardiac tamponade s/p drain. b. Persistent moderate pericardial effusion 03/2013.  . Pre-diabetes   . Pseudoaneurysm of left ventricle of heart    a. 01/2013:  Ruptured inferoposterior LV pseudoaneurysm s/p CorMatrix patch.  . Sinus bradycardia    asymptomatic  . Stroke ([facility] 2009   weak lt side-lt arm  . Tobacco abuse    a. quit 12/2012.  . Wears dentures    top    Past Surgical History:  Procedure Laterality Date  . CARDIAC CATHETERIZATION  01/06/2013  . COLONOSCOPY WITH PROPOFOL N/A 07/03/2016   Procedure: COLONOSCOPY WITH PROPOFOL;  Surgeon: SManus Gunning MD;  [facility];  Service: Gastroenterology;  Laterality: N/A;  . CORONARY ANGIOPLASTY WITH STENT PLACEMENT  2000's X 2   "1 + 1" (01/06/2013)  . CORONARY ARTERY BYPASS GRAFT N/A 01/28/2013   Procedure: CORONARY ARTERY BYPASS GRAFTING (CABG);  Surgeon: SMelrose Nakayama  MD;  Location: [facility];  Service: Open Heart Surgery;  Laterality: N/A;  CABG x one, using left greater saphenous vein harvested endoscopically  . INTRAOPERATIVE TRANSESOPHAGEAL ECHOCARDIOGRAM N/A 01/28/2013   Procedure: INTRAOPERATIVE TRANSESOPHAGEAL ECHOCARDIOGRAM;  Surgeon: Melrose Nakayama, MD;  Location: [place];  Service: Open Heart Surgery;  Laterality: N/A;  . LEFT HEART CATHETERIZATION WITH CORONARY ANGIOGRAM N/A 01/06/2013   Procedure: LEFT HEART CATHETERIZATION WITH CORONARY ANGIOGRAM;  Surgeon: Peter M Martinique, MD;  Location[facility] CATH LAB;  Service: Cardiovascular;   Laterality: N/A;  . LEFT HEART CATHETERIZATION WITH CORONARY ANGIOGRAM N/A 01/21/2013   Procedure: LEFT HEART CATHETERIZATION WITH CORONARY ANGIOGRAM;  Surgeon: Burnell Blanks, MD;  Location: [facility] CATH LAB;  Service: Cardiovascular;  Laterality: N/A;  . LUMBAR LAMINECTOMY/ DECOMPRESSION WITH MET-RX Right 06/30/2018   Procedure: Right minimally invasive Lumbar Three-Four Far lateral discectomy;  Surgeon: Judith Part, MD;  Location: [place];  Service: Neurosurgery;  Laterality: Right;  Right minimally invasive Lumbar Three-Four Far lateral discectomy  . MASS EXCISION Right 07/21/2014   Procedure: EXCISION RIGHT NECK MASS;  Surgeon: Erroll Luna, MD;  Location: [place];  Service: General;  Laterality: Right;  . PERICARDIAL TAP N/A 01/24/2013   Procedure: PERICARDIAL TAP;  Surgeon: Blane Ohara, MD;  Location: [facility] CATH LAB;  Service: Cardiovascular;  Laterality: N/A;  . RIGHT HEART CATHETERIZATION Right 01/24/2013   Procedure: RIGHT HEART CATH;  Surgeon: Blane Ohara, MD;  Location: [facility] CATH LAB;  Service: Cardiovascular;  Laterality: Right;  . VENTRICULAR ANEURYSM RESECTION N/A 01/28/2013   Procedure: LEFT VENTRICULAR ANEURYSM REPAIR;  Surgeon: Melrose Nakayama, MD;  Location: [place];  Service: Open Heart Surgery;  Laterality: N/A;    There were no vitals filed for this visit.  Subjective Assessment - 02/16/19 1025    Subjective  Doing all exercise   PAin is better but still constant .   Moving some better    Pain Score  4     Pain Location  Back    Pain Orientation  Right    Pain Descriptors / Indicators  --   pinch in back   Pain Onset  More than a month ago    Pain Frequency  Constant    Aggravating Factors   sit to standing    Pain Relieving Factors  heat                       [facility] Adult PT Treatment/Exercise - 02/16/19 0001      Lumbar Exercises: Stretches   Active Hamstring Stretch  Right;2 reps;30 seconds    Single Knee to  Chest Stretch  Right;Left;3 reps;20 seconds    Lower Trunk Rotation  2 reps;20 seconds      Lumbar Exercises: Aerobic   Nustep  UE/LE L6  5 min      Lumbar Exercises: Standing   Other Standing Lumbar Exercises  Step ups 8 inch x 15 RT and Lt , hip abduction x 12 reps RT/LT       Lumbar Exercises: Seated   Sit to Stand  --    Sit to Stand Limitations  --      Lumbar Exercises: Supine   Pelvic Tilt  15 reps;5 seconds    Bent Knee Raise  --    Bridge  15 reps    Isometric Hip Flexion  5 reps    Isometric Hip Flexion Limitations  and obliques x 5  Moist Heat Therapy   Number Minutes Moist Heat  15 Minutes    Moist Heat Location  Lumbar Spine      Manual Therapy   Joint Mobilization  L3 lumbar unilateral RT     Soft tissue mobilization  RT lower back    Passive ROM  TRunk rotation RT in Lt side lye x 50 reps               PT Short Term Goals - 02/16/19 1018      PT SHORT TERM GOAL #1   Title  He will be indpendent with initiAL Hep    Status  Achieved      PT SHORT TERM GOAL #2   Title  He will report 10% decr in RT hip/back pain    Status  Achieved        PT Long Term Goals - 01/27/19 1313      PT LONG TERM GOAL #1   Title  He will be indpendent with all HEP issued    Baseline  Indepndent with initial hEP    Time  6    Period  Weeks    Status  New      PT LONG TERM GOAL #2   Title  He will report pain decreased 50% or more in RT hip and back and be intermittant    Baseline  constant pain    Time  6    Period  Weeks    Status  New      PT LONG TERM GOAL #3   Title  He will report able to lye on rT side for short periods due to decrased pain    Baseline  unable to lye on RT side to sleep    Time  6    Period  Weeks    Status  New      PT LONG TERM GOAL #4   Title  He will report greater time sitting to 30 minor more with support  before pain starts    Baseline  5 min with weight to LT hip    Time  6    Period  Weeks    Status  New      PT  LONG TERM GOAL #5   Title  He will be able to walk without SPC in comunity    Baseline  uses SPC to ambulate out of home.    Time  6    Period  Weeks    Status  New      Additional Long Term Goals   Additional Long Term Goals  Yes      PT LONG TERM GOAL #6   Title  He will report getting to standing from sitting is 50% or more easier    Baseline  He reports difficulty getting out of chair and has falen while doing this in past    Time  6    Period  Weeks    Status  New            Plan - 02/16/19 1017    Clinical Impression Statement  Mr Shanks report doing better overall and wanted to continue PT 1x/week so will plan on 1x week for 6 weeks.  both STG Met    PT Frequency  1x / week    PT Duration  6 weeks    PT Treatment/Interventions  Dry needling;Passive range of motion;Manual techniques;Patient/family education;Therapeutic activities;Gait training;Moist Heat;Iontophoresis 12m/ml Dexamethasone;Electrical Stimulation;Therapeutic  exercise    PT Next Visit Plan  Add to HEP,   Manual and modalities; Medicaid re-auth requested additional visits today    PT Home Exercise Plan  Knee to chest, LTR,  piriformis, PPT, bent knee raise, seated H/s stretch, hooklying isometric hip flexion unilateral and opposites, sit-stand (Elevated)    Consulted and Agree with Plan of Care  Patient       Patient will benefit from skilled therapeutic intervention in order to improve the following deficits and impairments:  Pain, Postural dysfunction, Increased muscle spasms, Decreased activity tolerance, Decreased range of motion, Decreased strength, Difficulty walking  Visit Diagnosis: Muscle spasm of back  Radiculopathy, lumbar region  Joint stiffness of spine  Abnormal posture     Problem List Patient Active Problem List   Diagnosis Date Noted  . Lumbar radiculopathy 12/04/2018  . Glaucoma 12/04/2018  . Cortical age-related cataract of both eyes 12/04/2018  . Pain management contract  agreement 12/04/2018  . Lumbar herniated disc 06/04/2018  . Prediabetes 01/01/2018  . Hyperlipidemia 12/30/2017  . Obesity (BMI 30.0-34.9) 12/30/2017  . Benign neoplasm of descending colon   . Essential hypertension 11/03/2015  . Cardiomyopathy, ischemic 06/03/2015  . Chronic systolic dysfunction of left ventricle 06/03/2015  . Erectile dysfunction due to arterial insufficiency 12/23/2014  . Lipoma of skin and subcutaneous tissue of neck 05/24/2014  . History of cardioembolic stroke 73/73/0816  . Vision loss of right eye 06/23/2013  . Dyslipidemia 06/22/2013  . 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 Coronary artery disease   . Cardiomyopathy ([place])   . Pseudoaneurysm of left ventricle of heart   . Tobacco abuse     Darrel Hoover 02/16/2019, 10:27 AM  [facility] [address] [place], [place], [address] Phone: [phone]   Fax:  [phone]  Name: [name] MRN: [id number] Date of Birth: September 23, 1960

## 2019-02-18 LAB — NOVEL CORONAVIRUS, NAA: SARS-CoV-2, NAA: NOT DETECTED

## 2019-02-23 ENCOUNTER — Other Ambulatory Visit: Payer: Self-pay

## 2019-02-23 ENCOUNTER — Ambulatory Visit: Payer: Medicare Other

## 2019-02-23 DIAGNOSIS — M5416 Radiculopathy, lumbar region: Secondary | ICD-10-CM | POA: Diagnosis not present

## 2019-02-23 DIAGNOSIS — R293 Abnormal posture: Secondary | ICD-10-CM

## 2019-02-23 DIAGNOSIS — M2560 Stiffness of unspecified joint, not elsewhere classified: Secondary | ICD-10-CM | POA: Diagnosis not present

## 2019-02-23 DIAGNOSIS — M62830 Muscle spasm of back: Secondary | ICD-10-CM

## 2019-02-23 NOTE — Therapy (Signed)
[place], [place], [address] Phone: [phone]   Fax:  [phone]  Physical Therapy Treatment  Patient Details  Name: [name] MRN: [id number] Date of Birth: 1960/11/28 Referring Provider (PT): Emelda Brothers, MD   Encounter Date: 02/23/2019  PT End of Session - 02/23/19 1419    Visit Number  5    Number of Visits  12    Date for PT Re-Evaluation  03/13/19    Authorization Type  MCD    Authorization Time Period  until 12/20    Authorization - Visit Number  1    Authorization - Number of Visits  6    PT Start Time  0208    PT Stop Time  0300    PT Time Calculation (min)  52 min    Activity Tolerance  Patient tolerated treatment well    Behavior During Therapy  [facility] for tasks assessed/performed       Past Medical History:  Diagnosis Date  . Abnormal EKG    hx of ischemia showing up on ekg's  . Acute myopericarditis    a. 03/2013: readm with hypoxia, tachycardia, elevated ESR/CRP, elevated troponin with 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and myopericarditis; treated with steroids.  . Acute respiratory failure ([place])    a. 01/2013: VDRF. b. 03/2013: hypoxia requiring supp O2 during adm, resolved by discharge.  . C. difficile colitis    a. 01/2013 during prolonged adm. b. Recurred 03/2013.  . Cardiac tamponade    a. 01/2013 s/p drain.  . Coronary artery disease    a. s/p MI in 2009 in [place] with stenting of the LCX and LAD;  b. 12/2012 NSTEMI/CAD: LM nl, LAD patent prox stent, LCX 50-70 isr (FFR 0.84), RCA dom, 194m EF 40-45%-->Med Rx. c. 01/2013: anterolateral STEMI complicated by pericardial effusion (presumed purulent pericarditis) and tamponade s/p drain, ruptured LV pseudoaneurysm s/p CorMatrix patch, CABGx1 (SVG-OM1), fever, CVA, VDRF, C diff.  . CVA (cerebral infarction)    a. 01/2013 in setting of prolonged hospitalization - residual L arm weakness.  . 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([place]    a. 03/2013: readm with  hypoxia, tachycardia, elevated ESR/CRP, elevated troponin with 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and myopericarditis; treated with steroids.  . Hyperlipidemia   . Hypokalemia   . Hyponatremia    a. During late 2014.  . Ischemic cardiomyopathy    a. Sept/Oct 2014: EF ~40% (ICM). b. 03/2013: EF 25-30%. Off ACEI due to hypotension (MIXED NICM/ICM).  .[place] Kitchen[place]ocardial infarction ([place] 2009  . Pericardial effusion    a. 01/2013: pericardial effusion (presumed purulent pericarditis) and cardiac tamponade s/p drain. b. Persistent moderate pericardial effusion 03/2013.  . Pre-diabetes   . Pseudoaneurysm of left ventricle of heart    a. 01/2013:  Ruptured inferoposterior LV pseudoaneurysm s/p CorMatrix patch.  . Sinus bradycardia    asymptomatic  . Stroke ([facility] 2009   weak lt side-lt arm  . Tobacco abuse    a. quit 12/2012.  . Wears dentures    top    Past Surgical History:  Procedure Laterality Date  . CARDIAC CATHETERIZATION  01/06/2013  . COLONOSCOPY WITH PROPOFOL N/A 07/03/2016   Procedure: COLONOSCOPY WITH PROPOFOL;  Surgeon: SManus Gunning MD;  Location: [facility];  Service: Gastroenterology;  Laterality: N/A;  . CORONARY ANGIOPLASTY WITH STENT PLACEMENT  2000's X 2   "1 + 1" (01/06/2013)  . CORONARY ARTERY BYPASS GRAFT N/A 01/28/2013   Procedure: CORONARY ARTERY BYPASS GRAFTING (CABG);  Surgeon: SMelrose Nakayama  MD;  Location: [facility] OR;  Service: Open Heart Surgery;  Laterality: N/A;  CABG x one, using left greater saphenous vein harvested endoscopically  . INTRAOPERATIVE TRANSESOPHAGEAL ECHOCARDIOGRAM N/A 01/28/2013   Procedure: INTRAOPERATIVE TRANSESOPHAGEAL ECHOCARDIOGRAM;  Surgeon: Melrose Nakayama, MD;  Location: [place];  Service: Open Heart Surgery;  Laterality: N/A;  . LEFT HEART CATHETERIZATION WITH CORONARY ANGIOGRAM N/A 01/06/2013   Procedure: LEFT HEART CATHETERIZATION WITH CORONARY ANGIOGRAM;  Surgeon: Peter M Martinique, MD;  Location: [facility] CATH LAB;  Service: Cardiovascular;   Laterality: N/A;  . LEFT HEART CATHETERIZATION WITH CORONARY ANGIOGRAM N/A 01/21/2013   Procedure: LEFT HEART CATHETERIZATION WITH CORONARY ANGIOGRAM;  Surgeon: Burnell Blanks, MD;  Location: [facility] CATH LAB;  Service: Cardiovascular;  Laterality: N/A;  . LUMBAR LAMINECTOMY/ DECOMPRESSION WITH MET-RX Right 06/30/2018   Procedure: Right minimally invasive Lumbar Three-Four Far lateral discectomy;  Surgeon: Judith Part, MD;  Location: [place];  Service: Neurosurgery;  Laterality: Right;  Right minimally invasive Lumbar Three-Four Far lateral discectomy  . MASS EXCISION Right 07/21/2014   Procedure: EXCISION RIGHT NECK MASS;  Surgeon: Erroll Luna, MD;  Location: [place];  Service: General;  Laterality: Right;  . PERICARDIAL TAP N/A 01/24/2013   Procedure: PERICARDIAL TAP;  Surgeon: Blane Ohara, MD;  Location: [facility] CATH LAB;  Service: Cardiovascular;  Laterality: N/A;  . RIGHT HEART CATHETERIZATION Right 01/24/2013   Procedure: RIGHT HEART CATH;  Surgeon: Blane Ohara, MD;  Location: [facility] CATH LAB;  Service: Cardiovascular;  Laterality: Right;  . VENTRICULAR ANEURYSM RESECTION N/A 01/28/2013   Procedure: LEFT VENTRICULAR ANEURYSM REPAIR;  Surgeon: Melrose Nakayama, MD;  Location: [place];  Service: Open Heart Surgery;  Laterality: N/A;    There were no vitals filed for this visit.  Subjective Assessment - 02/23/19 1412    Subjective  Feel strength of RT leg is better and pain eaed  . Yesterday pain increased but today very mild pain    Pain Score  2     Pain Location  Back    Pain Orientation  Right    Pain Onset  More than a month ago    Pain Frequency  Constant    Aggravating Factors   trasition from sitting    Pain Relieving Factors  heat                       [facility] Adult PT Treatment/Exercise - 02/23/19 0001      Lumbar Exercises: Aerobic   Nustep  UE/LE L6  5 min      Lumbar Exercises: Standing   Heel Raises  20 reps    Functional  Squats  10 reps    Other Standing Lumbar Exercises  marching x 15 RT/TL , hip abduction x 15 RT/LT  , extension  RT/Lt x 15  3# on ankles.       Lumbar Exercises: Seated   Long Arc Quad on Chair  Right;Left;20 reps    LAQ on Chair Weights (lbs)  3    Sit to Stand  --    Sit to Stand Limitations  hands on knees to rise; no UE to lower :foam pad to elevate low mat table     Other Seated Lumbar Exercises  Seated SLR x 15 RT LT 3# on ankles       Lumbar Exercises: Supine   Pelvic Tilt  5 seconds;20 reps    Bridge  20 reps    Bridge Limitations  legs on red ball  followed by LTR x 15 RT/LT ,  followed by SAQ 3# RTLT       Lumbar Exercises: Sidelying   Clam  Right;Left;10 reps    Clam Limitations  3# on knee    Hip Abduction  Right;Left;10 reps    Hip Abduction Weights (lbs)  3      Moist Heat Therapy   Number Minutes Moist Heat  --   during session   Moist Heat Location  Lumbar Spine      Manual Therapy   Joint Mobilization  L3 lumbar unilateral RT     Soft tissue mobilization  RT lower back               PT Short Term Goals - 02/23/19 1508      PT SHORT TERM GOAL #1   Title  He will be indpendent with initiAL Hep    Status  Achieved      PT SHORT TERM GOAL #2   Title  He will report 10% decr in RT hip/back pain    Baseline  overall 10%    Status  Achieved        PT Long Term Goals - 01/27/19 1313      PT LONG TERM GOAL #1   Title  He will be indpendent with all HEP issued    Baseline  Indepndent with initial hEP    Time  6    Period  Weeks    Status  New      PT LONG TERM GOAL #2   Title  He will report pain decreased 50% or more in RT hip and back and be intermittant    Baseline  constant pain    Time  6    Period  Weeks    Status  New      PT LONG TERM GOAL #3   Title  He will report able to lye on rT side for short periods due to decrased pain    Baseline  unable to lye on RT side to sleep    Time  6    Period  Weeks    Status  New      PT  LONG TERM GOAL #4   Title  He will report greater time sitting to 30 minor more with support  before pain starts    Baseline  5 min with weight to LT hip    Time  6    Period  Weeks    Status  New      PT LONG TERM GOAL #5   Title  He will be able to walk without SPC in comunity    Baseline  uses SPC to ambulate out of home.    Time  6    Period  Weeks    Status  New      Additional Long Term Goals   Additional Long Term Goals  Yes      PT LONG TERM GOAL #6   Title  He will report getting to standing from sitting is 50% or more easier    Baseline  He reports difficulty getting out of chair and has falen while doing this in past    Time  6    Period  Weeks    Status  New            Plan - 02/23/19 1420    Clinical Impression Statement  He tolerated increased load  today.  reported a "good Pain " with work out. He works hard with exercises.  No increased pain post session , just mild fatigue    PT Treatment/Interventions  Dry needling;Passive range of motion;Manual techniques;Patient/family education;Therapeutic activities;Gait training;Moist Heat;Iontophoresis 105m/ml Dexamethasone;Electrical Stimulation;Therapeutic exercise    PT Next Visit Plan  Add to HEP,   Manual and modalities;    PT Home Exercise Plan  Knee to chest, LTR,  piriformis, PPT, bent knee raise, seated H/s stretch, hooklying isometric hip flexion unilateral and opposites, sit-stand (Elevated)    Consulted and Agree with Plan of Care  Patient       Patient will benefit from skilled therapeutic intervention in order to improve the following deficits and impairments:  Pain, Postural dysfunction, Increased muscle spasms, Decreased activity tolerance, Decreased range of motion, Decreased strength, Difficulty walking  Visit Diagnosis: Radiculopathy, lumbar region  Joint stiffness of spine  Abnormal posture  Muscle spasm of back     Problem List Patient Active Problem List   Diagnosis Date Noted  .  Lumbar radiculopathy 12/04/2018  . Glaucoma 12/04/2018  . Cortical age-related cataract of both eyes 12/04/2018  . Pain management contract agreement 12/04/2018  . Lumbar herniated disc 06/04/2018  . Prediabetes 01/01/2018  . Hyperlipidemia 12/30/2017  . Obesity (BMI 30.0-34.9) 12/30/2017  . Benign neoplasm of descending colon   . Essential hypertension 11/03/2015  . Cardiomyopathy, ischemic 06/03/2015  . Chronic systolic dysfunction of left ventricle 06/03/2015  . Erectile dysfunction due to arterial insufficiency 12/23/2014  . Lipoma of skin and subcutaneous tissue of neck 05/24/2014  . History of cardioembolic stroke [id number] . Vision loss of right eye 06/23/2013  . Dyslipidemia 06/22/2013  . 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([place]   . Coronary artery disease   . Cardiomyopathy ([place]   . Pseudoaneurysm of left ventricle of heart   . Tobacco abuse     CDarrel Hoover PT 02/23/2019, 3:09 PM  [facility]GAval[address] N[place] [address]Phone: [phone]  Fax:  [phone] Name: Robert ROSAMONDMRN: [id number] of Birth: [birth year]

## 2019-02-24 ENCOUNTER — Ambulatory Visit (INDEPENDENT_AMBULATORY_CARE_PROVIDER_SITE_OTHER): Payer: Medicare Other | Admitting: Pharmacist

## 2019-02-24 VITALS — BP 136/82 | HR 62 | Ht 72.0 in | Wt 246.4 lb

## 2019-02-24 DIAGNOSIS — I519 Heart disease, unspecified: Secondary | ICD-10-CM

## 2019-02-24 DIAGNOSIS — Z720 Tobacco use: Secondary | ICD-10-CM | POA: Diagnosis not present

## 2019-02-24 DIAGNOSIS — I255 Ischemic cardiomyopathy: Secondary | ICD-10-CM

## 2019-02-24 MED ORDER — ENTRESTO 97-103 MG PO TABS: 1.0000 | ORAL_TABLET | Freq: Two times a day (BID) | ORAL | 4 refills | Status: DC

## 2019-02-24 NOTE — Progress Notes (Signed)
HPI:  [name] is a 58 y.o. male patient of Dr Martinique, with a PMH below who presents today for heart failure (HFrEF) medication titration.   See pertinent past medical history below.   Patient was seen in virtual visit with Dr. Martinique earlier this month.   An Echo done on Oct 10 shows EF to be 25-30%.   Patient was started on Entresto 49-82m twice daily during last OV and follow up blood work showed stable renal function. Nicotine patches also prescribed to help with smoking cessation.    Today patient reports feeling well.  No complaints of chest pain or shortness of breath.  Nicotine patches for smoking cessation were provided during last OV but therapy not started yet..   Past Medical History: ASCVD Stents to LAD, left circumflex in 2007, CABG x 1 (SVG to OM 2014)  hypertension Per flowsheets looks well controlled on carvedilol 25 bid, lisinopril 10 bid  hyperlipidemia (9/19) TC 158, TG 89, HDL 57, LDL 83 - on atorvastatin 80  Cardioembolic stroke After LV pseudoaneurysm rupture   Pre-diabetes (6/19) A1c 6.1  glaucoma Alphagan, latanoprost     Blood Pressure Goal:  130/80  Current Medications:  Carvedilol 25 mg bid, Entresto 49-553mtwice daily  Family Hx: mother - died at MI at 4954father died from [place] all healthy  Children healthy   Social Hx: smoking 1 ppd; no alcohol, only rare soda, mostly juices  Diet: some take out; occaisonal deep fried chicken; plenty of vegetables; snacks on cookies  Exercise: some limitations with walking (cane), currently getting PT  Home BP readings: no home meter available  Intolerances: nkda  Labs: 02/12/19:  Na 140, K 4.0, Glu 79, BUN 14, SCr 0.98  Wt Readings from Last 3 Encounters:  02/24/19 246 lb 6.4 oz (111.8 kg)  02/16/19 243 lb (110.2 kg)  02/12/19 246 lb (111.6 kg)   BP Readings from Last 3 Encounters:  02/24/19 136/82  02/16/19 133/86  02/12/19 134/84   Pulse Readings from Last 3 Encounters:   02/24/19 62  02/16/19 62  02/12/19 67    Current Outpatient Medications  Medication Sig Dispense Refill  . ALPHAGAN P 0.1 % SOLN Place 1 drop into both eyes 2 (two) times daily. 10 mL 0  . aspirin 81 MG EC tablet Take 1 tablet (81 mg total) by mouth daily. Restart on 07/05/2018 90 tablet 3  . atorvastatin (LIPITOR) 80 MG tablet Take 1 tablet (80 mg total) by mouth daily. 30 tablet 3  . carvedilol (COREG) 25 MG tablet Take 1 tablet (25 mg total) by mouth 2 (two) times daily with a meal. NEED OV. 60 tablet 5  . Cholecalciferol (VITAMIN D) 2000 units tablet Take 2,000 Units by mouth daily.    . Marland Kitchenatanoprost (XALATAN) 0.005 % ophthalmic solution Place 1 drop into both eyes daily.  12  . Multiple Vitamin (MULTIVITAMIN WITH MINERALS) TABS tablet Take 1 tablet by mouth daily.    . nicotine (NICODERM CQ - DOSED IN MG/24 HOURS) 14 mg/24hr patch Place 1 patch (14 mg total) onto the skin daily. 14 patch 0  . nicotine (NICODERM CQ - DOSED IN MG/24 HOURS) 21 mg/24hr patch Place 1 patch (21 mg total) onto the skin daily. 14 patch 2  . nicotine (NICODERM CQ - DOSED IN MG/24 HR) 7 mg/24hr patch Place 1 patch (7 mg total) onto the skin daily. 14 patch 0  . nitroGLYCERIN (NITROSTAT) 0.4 MG SL tablet Place  1 tablet (0.4 mg total) under the tongue every 5 (five) minutes as needed for chest pain (up to 3 doses). 25 tablet 0  . sacubitril-valsartan (ENTRESTO) 97-103 MG Take 1 tablet by mouth 2 (two) times daily. 60 tablet 4   No current facility-administered medications for this visit.     No Known Allergies  Past Medical History:  Diagnosis Date  . Abnormal EKG    hx of ischemia showing up on ekg's  . Acute myopericarditis    a. 03/2013: readm with hypoxia, tachycardia, elevated ESR/CRP, elevated troponin with 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and myopericarditis; treated with steroids.  . Acute respiratory failure ([place])    a. 01/2013: VDRF. b. 03/2013: hypoxia requiring supp O2 during adm, resolved by discharge.  . C.  difficile colitis    a. 01/2013 during prolonged adm. b. Recurred 03/2013.  . Cardiac tamponade    a. 01/2013 s/p drain.  . Coronary artery disease    a. s/p MI in 2009 in [place] with stenting of the LCX and LAD;  b. 12/2012 NSTEMI/CAD: LM nl, LAD patent prox stent, LCX 50-70 isr (FFR 0.84), RCA dom, 152m EF 40-45%-->Med Rx. c. 01/2013: anterolateral STEMI complicated by pericardial effusion (presumed purulent pericarditis) and tamponade s/p drain, ruptured LV pseudoaneurysm s/p CorMatrix patch, CABGx1 (SVG-OM1), fever, CVA, VDRF, C diff.  . CVA (cerebral infarction)    a. 01/2013 in setting of prolonged hospitalization - residual L arm weakness.  . 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([place]    a. 03/2013: readm with hypoxia, tachycardia, elevated ESR/CRP, elevated troponin with 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and myopericarditis; treated with steroids.  . Hyperlipidemia   . Hypokalemia   . Hyponatremia    a. During late 2014.  . Ischemic cardiomyopathy    a. Sept/Oct 2014: EF ~40% (ICM). b. 03/2013: EF 25-30%. Off ACEI due to hypotension (MIXED NICM/ICM).  .Marland KitchenMyocardial infarction ([place]  . Pericardial effusion    a. 01/2013: pericardial effusion (presumed purulent pericarditis) and cardiac tamponade s/p drain. b. Persistent moderate pericardial effusion 03/2013.  . Pre-diabetes   . Pseudoaneurysm of left ventricle of heart    a. 01/2013:  Ruptured inferoposterior LV pseudoaneurysm s/p CorMatrix patch.  . Sinus bradycardia    asymptomatic  . Stroke ([facility] 2009   weak lt side-lt arm  . Tobacco abuse    a. quit 12/2012.  . Wears dentures    top    Blood pressure 136/82, pulse 62, height 6' (1.829 m), weight 246 lb 6.4 oz (111.8 kg), SpO2 99 %.  Tobacco abuse Nicotine patches Rx provided. Patient expressed interest in quitting but remains on contemplation phase.   Chronic systolic dysfunction of left ventricle BP, renal function and electrolytes remain appropriate for further Entresto titration. Will increase  Entresto dose to 97/1075mtwice daily, provide 30day free card and follow up in 3 weeks.  We were able to provide a home BP cuff free of charge. Patient is to monitor BP twice daily and bring records to follow up visit. Message was sent to Dr JoMartiniquerimary nurse to start prior-authorization process for MEDICAID as well.  Plan to repeat BMET during next office visit and consider adding low dose spironolactone to therapy if BP and renal function allows.   Ercole Georg Rodriguez-Guzman PharmD, BCPS, CPKendall West2[id number]/15/2020 6:26 PM

## 2019-03-01 ENCOUNTER — Encounter: Payer: Self-pay | Admitting: Pharmacist

## 2019-03-01 NOTE — Assessment & Plan Note (Signed)
Nicotine patches Rx provided. Patient expressed interest in quitting but remains on contemplation phase.

## 2019-03-01 NOTE — Assessment & Plan Note (Signed)
BP, renal function and electrolytes remain appropriate for further Entresto titration. Will increase Entresto dose to 97/103mg  twice daily, provide 30day free card and follow up in 3 weeks.  We were able to provide a home BP cuff free of charge. Patient is to monitor BP twice daily and bring records to follow up visit. Message was sent to Dr Martinique primary nurse to start prior-authorization process for MEDICAID as well.  Plan to repeat BMET during next office visit and consider adding low dose spironolactone to therapy if BP and renal function allows.

## 2019-03-02 ENCOUNTER — Ambulatory Visit: Payer: Medicare Other

## 2019-03-02 ENCOUNTER — Other Ambulatory Visit: Payer: Self-pay

## 2019-03-02 DIAGNOSIS — R293 Abnormal posture: Secondary | ICD-10-CM | POA: Diagnosis not present

## 2019-03-02 DIAGNOSIS — M5416 Radiculopathy, lumbar region: Secondary | ICD-10-CM | POA: Diagnosis not present

## 2019-03-02 DIAGNOSIS — M62830 Muscle spasm of back: Secondary | ICD-10-CM | POA: Diagnosis not present

## 2019-03-02 DIAGNOSIS — M2560 Stiffness of unspecified joint, not elsewhere classified: Secondary | ICD-10-CM | POA: Diagnosis not present

## 2019-03-02 NOTE — Therapy (Signed)
[place], [place], [address] Phone: [phone]   Fax:  [phone]  Physical Therapy Treatment  Patient Details  Name: [name] MRN: [id number] Date of Birth: [birth year] Referring Provider (PT): Emelda Brothers, MD   Encounter Date: 03/02/2019  PT End of Session - 03/02/19 1501    Visit Number  6    Number of Visits  12    Date for PT Re-Evaluation  03/13/19    Authorization Type  MCD    Authorization Time Period  until 12/20    Authorization - Visit Number  2    Authorization - Number of Visits  6    PT Start Time  0300    PT Stop Time  0352    PT Time Calculation (min)  52 min    Activity Tolerance  Patient tolerated treatment well    Behavior During Therapy  [facility] for tasks assessed/performed       Past Medical History:  Diagnosis Date  . Abnormal EKG    hx of ischemia showing up on ekg's  . Acute myopericarditis    a. 03/2013: readm with hypoxia, tachycardia, elevated ESR/CRP, elevated troponin with 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and myopericarditis; treated with steroids.  . Acute respiratory failure ([place])    a. 01/2013: VDRF. b. 03/2013: hypoxia requiring supp O2 during adm, resolved by discharge.  . C. difficile colitis    a. 01/2013 during prolonged adm. b. Recurred 03/2013.  . Cardiac tamponade    a. 01/2013 s/p drain.  . Coronary artery disease    a. s/p MI in 2009 in [place] with stenting of the LCX and LAD;  b. 12/2012 NSTEMI/CAD: LM nl, LAD patent prox stent, LCX 50-70 isr (FFR 0.84), RCA dom, 190m EF 40-45%-->Med Rx. c. 01/2013: anterolateral STEMI complicated by pericardial effusion (presumed purulent pericarditis) and tamponade s/p drain, ruptured LV pseudoaneurysm s/p CorMatrix patch, CABGx1 (SVG-OM1), fever, CVA, VDRF, C diff.  . CVA (cerebral infarction)    a. 01/2013 in setting of prolonged hospitalization - residual L arm weakness.  . 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([place]    a. 03/2013: readm with  hypoxia, tachycardia, elevated ESR/CRP, elevated troponin with 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and myopericarditis; treated with steroids.  . Hyperlipidemia   . Hypokalemia   . Hyponatremia    a. During late 2014.  . Ischemic cardiomyopathy    a. Sept/Oct 2014: EF ~40% (ICM). b. 03/2013: EF 25-30%. Off ACEI due to hypotension (MIXED NICM/ICM).  .[place] ([place]  . Pericardial effusion    a. 01/2013: pericardial effusion (presumed purulent pericarditis) and cardiac tamponade s/p drain. b. Persistent moderate pericardial effusion 03/2013.  . Pre-diabetes   . Pseudoaneurysm of left ventricle of heart    a. 01/2013:  Ruptured inferoposterior LV pseudoaneurysm s/p CorMatrix patch.  . Sinus bradycardia    asymptomatic  . Stroke ([facility] 2009   weak lt side-lt arm  . Tobacco abuse    a. quit 12/2012.  . Wears dentures    top    Past Surgical History:  Procedure Laterality Date  . CARDIAC CATHETERIZATION  01/06/2013  . COLONOSCOPY WITH PROPOFOL N/A 07/03/2016   Procedure: COLONOSCOPY WITH PROPOFOL;  Surgeon: SManus Gunning MD;  [facility];  Service: Gastroenterology;  Laterality: N/A;  . CORONARY ANGIOPLASTY WITH STENT PLACEMENT  2000's X 2   "1 + 1" (01/06/2013)  . CORONARY ARTERY BYPASS GRAFT N/A 01/28/2013   Procedure: CORONARY ARTERY BYPASS GRAFTING (CABG);  Surgeon: SMelrose Nakayama  MD;  [facility];  Service: Open Heart Surgery;  Laterality: N/A;  CABG x one, using left greater saphenous vein harvested endoscopically  . INTRAOPERATIVE TRANSESOPHAGEAL ECHOCARDIOGRAM N/A 01/28/2013   Procedure: INTRAOPERATIVE TRANSESOPHAGEAL ECHOCARDIOGRAM;  Surgeon: Melrose Nakayama, MD;  Location: [place];  Service: Open Heart Surgery;  Laterality: N/A;  . LEFT HEART CATHETERIZATION WITH CORONARY ANGIOGRAM N/A 01/06/2013   Procedure: LEFT HEART CATHETERIZATION WITH CORONARY ANGIOGRAM;  Surgeon: Peter M Martinique, MD;  Location: [facility] CATH LAB;  Service: Cardiovascular;   Laterality: N/A;  . LEFT HEART CATHETERIZATION WITH CORONARY ANGIOGRAM N/A 01/21/2013   Procedure: LEFT HEART CATHETERIZATION WITH CORONARY ANGIOGRAM;  Surgeon: Burnell Blanks, MD;  Location: [facility] CATH LAB;  Service: Cardiovascular;  Laterality: N/A;  . LUMBAR LAMINECTOMY/ DECOMPRESSION WITH MET-RX Right 06/30/2018   Procedure: Right minimally invasive Lumbar Three-Four Far lateral discectomy;  Surgeon: Judith Part, MD;  Location: [place];  Service: Neurosurgery;  Laterality: Right;  Right minimally invasive Lumbar Three-Four Far lateral discectomy  . MASS EXCISION Right 07/21/2014   Procedure: EXCISION RIGHT NECK MASS;  Surgeon: Erroll Luna, MD;  Location: [place];  Service: General;  Laterality: Right;  . PERICARDIAL TAP N/A 01/24/2013   Procedure: PERICARDIAL TAP;  Surgeon: Blane Ohara, MD;  Location: [facility] CATH LAB;  Service: Cardiovascular;  Laterality: N/A;  . RIGHT HEART CATHETERIZATION Right 01/24/2013   Procedure: RIGHT HEART CATH;  Surgeon: Blane Ohara, MD;  Location: [facility] CATH LAB;  Service: Cardiovascular;  Laterality: Right;  . VENTRICULAR ANEURYSM RESECTION N/A 01/28/2013   Procedure: LEFT VENTRICULAR ANEURYSM REPAIR;  Surgeon: Melrose Nakayama, MD;  Location: [place];  Service: Open Heart Surgery;  Laterality: N/A;    There were no vitals filed for this visit.  Subjective Assessment - 03/02/19 1502    Subjective  Feeling better.    Pain Score  2     Pain Location  Back    Pain Orientation  Right    Pain Descriptors / Indicators  --   pinch   Pain Onset  More than a month ago    Pain Frequency  Constant    Aggravating Factors   stand from sitting    Pain Relieving Factors  heat                       [facility] Adult PT Treatment/Exercise - 03/02/19 0001      Lumbar Exercises: Aerobic   Nustep  UE/LE L6  8 min      Lumbar Exercises: Standing   Heel Raises Limitations  25 reps    Functional Squats Limitations  12     Other Standing Lumbar Exercises  marching x 15 RT/TL , hip abduction x 15 RT/LT  , extension  RT/Lt x 15  4# on ankles.       Lumbar Exercises: Seated   Long Arc Quad on Chair  Right;Left;20 reps    LAQ on Chair Weights (lbs)  5      Lumbar Exercises: Supine   Pelvic Tilt  10 reps    Pelvic Tilt Limitations  partial head and necklift    Other Supine Lumbar Exercises  LTR x 15 RT and LT legs on ball.       Lumbar Exercises: Sidelying   Clam  Right;Left;15 reps    Clam Limitations  5# at knee    Hip Abduction  Right;Left;10 reps    Hip Abduction Weights (lbs)  5  Lumbar Exercises: Prone   Other Prone Lumbar Exercises  Knee flexion  5# x 15 reps RT/LT       Moist Heat Therapy   Number Minutes Moist Heat  10 Minutes    Moist Heat Location  Lumbar Spine      Manual Therapy   Joint Mobilization  --    Soft tissue mobilization  --               PT Short Term Goals - 02/23/19 1508      PT SHORT TERM GOAL #1   Title  He will be indpendent with initiAL Hep    Status  Achieved      PT SHORT TERM GOAL #2   Title  He will report 10% decr in RT hip/back pain    Baseline  overall 10%    Status  Achieved        PT Long Term Goals - 01/27/19 1313      PT LONG TERM GOAL #1   Title  He will be indpendent with all HEP issued    Baseline  Indepndent with initial hEP    Time  6    Period  Weeks    Status  New      PT LONG TERM GOAL #2   Title  He will report pain decreased 50% or more in RT hip and back and be intermittant    Baseline  constant pain    Time  6    Period  Weeks    Status  New      PT LONG TERM GOAL #3   Title  He will report able to lye on rT side for short periods due to decrased pain    Baseline  unable to lye on RT side to sleep    Time  6    Period  Weeks    Status  New      PT LONG TERM GOAL #4   Title  He will report greater time sitting to 30 minor more with support  before pain starts    Baseline  5 min with weight to LT hip    Time   6    Period  Weeks    Status  New      PT LONG TERM GOAL #5   Title  He will be able to walk without SPC in comunity    Baseline  uses SPC to ambulate out of home.    Time  6    Period  Weeks    Status  New      Additional Long Term Goals   Additional Long Term Goals  Yes      PT LONG TERM GOAL #6   Title  He will report getting to standing from sitting is 50% or more easier    Baseline  He reports difficulty getting out of chair and has falen while doing this in past    Time  6    Period  Weeks    Status  New            Plan - 03/02/19 1501    Clinical Impression Statement  Back sore with exercxisae but no incr pain. He pushed himself  withn all exercises.   Will continue with incr load and reps as tolerated.    PT Treatment/Interventions  Dry needling;Passive range of motion;Manual techniques;Patient/family education;Therapeutic activities;Gait training;Moist Heat;Iontophoresis 32m/ml Dexamethasone;Electrical Stimulation;Therapeutic exercise    PT Next Visit  Plan  Add to HEPwip and thigh strength. ,   Manual and modalities as needed    PT Home Exercise Plan  Knee to chest, LTR,  piriformis, PPT, bent knee raise, seated H/s stretch, hooklying isometric hip flexion unilateral and opposites, sit-stand (Elevated)    Consulted and Agree with Plan of Care  Patient       Patient will benefit from skilled therapeutic intervention in order to improve the following deficits and impairments:  Pain, Postural dysfunction, Increased muscle spasms, Decreased activity tolerance, Decreased range of motion, Decreased strength, Difficulty walking  Visit Diagnosis: Radiculopathy, lumbar region  Joint stiffness of spine  Abnormal posture  Muscle spasm of back     Problem List Patient Active Problem List   Diagnosis Date Noted  . Lumbar radiculopathy 12/04/2018  . Glaucoma 12/04/2018  . Cortical age-related cataract of both eyes 12/04/2018  . Pain management contract agreement  12/04/2018  . Lumbar herniated disc 06/04/2018  . Prediabetes 01/01/2018  . Hyperlipidemia 12/30/2017  . Obesity (BMI 30.0-34.9) 12/30/2017  . Benign neoplasm of descending colon   . Essential hypertension 11/03/2015  . Cardiomyopathy, ischemic 06/03/2015  . Chronic systolic dysfunction of left ventricle 06/03/2015  . Erectile dysfunction due to arterial insufficiency 12/23/2014  . Lipoma of skin and subcutaneous tissue of neck 05/24/2014  . History of cardioembolic stroke 37/50/5107  . Vision loss of right eye 06/23/2013  . Dyslipidemia 06/22/2013  . 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([place])   . Coronary artery disease   . Cardiomyopathy ([place])   . Pseudoaneurysm of left ventricle of heart   . Tobacco abuse     Darrel Hoover  PT 03/02/2019, 3:52 PM  [facility] [address] [place], [place], [address] Phone: [phone]   Fax:  [phone]  Name: [name] MRN: [id number] Date of Birth: 08/13/1960

## 2019-03-04 ENCOUNTER — Telehealth: Payer: Self-pay

## 2019-03-04 NOTE — Telephone Encounter (Signed)
As per Eustace Quail, [employer] [place], the patient has been difficult to reach.  They will attempt again to contact him.

## 2019-03-05 DIAGNOSIS — H2513 Age-related nuclear cataract, bilateral: Secondary | ICD-10-CM | POA: Diagnosis not present

## 2019-03-05 DIAGNOSIS — H47293 Other optic atrophy, bilateral: Secondary | ICD-10-CM | POA: Diagnosis not present

## 2019-03-05 DIAGNOSIS — H401134 Primary open-angle glaucoma, bilateral, indeterminate stage: Secondary | ICD-10-CM | POA: Diagnosis not present

## 2019-03-09 ENCOUNTER — Encounter: Payer: Self-pay | Admitting: Physical Therapy

## 2019-03-09 ENCOUNTER — Ambulatory Visit: Payer: Medicare Other | Admitting: Physical Therapy

## 2019-03-09 ENCOUNTER — Other Ambulatory Visit: Payer: Self-pay

## 2019-03-09 DIAGNOSIS — M5416 Radiculopathy, lumbar region: Secondary | ICD-10-CM | POA: Diagnosis not present

## 2019-03-09 DIAGNOSIS — M2560 Stiffness of unspecified joint, not elsewhere classified: Secondary | ICD-10-CM

## 2019-03-09 DIAGNOSIS — M62830 Muscle spasm of back: Secondary | ICD-10-CM

## 2019-03-09 DIAGNOSIS — R293 Abnormal posture: Secondary | ICD-10-CM

## 2019-03-09 NOTE — Therapy (Signed)
[place], [place], [address] Phone: [phone]   Fax:  [phone]  Physical Therapy Treatment  Patient Details  Name: [name] MRN: [id number] Date of Birth: 1960-09-05 Referring Provider (PT): Emelda Brothers, MD   Encounter Date: 03/09/2019  PT End of Session - 03/09/19 1234    Visit Number  7    Number of Visits  12    Date for PT Re-Evaluation  03/13/19    Authorization Type  MCD    Authorization Time Period  until 12/20    Authorization - Visit Number  3    Authorization - Number of Visits  6    PT Start Time  1225    PT Stop Time  1320    PT Time Calculation (min)  55 min       Past Medical History:  Diagnosis Date  . Abnormal EKG    hx of ischemia showing up on ekg's  . Acute myopericarditis    a. 03/2013: readm with hypoxia, tachycardia, elevated ESR/CRP, elevated troponin with 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and myopericarditis; treated with steroids.  . Acute respiratory failure ([place])    a. 01/2013: VDRF. b. 03/2013: hypoxia requiring supp O2 during adm, resolved by discharge.  . C. difficile colitis    a. 01/2013 during prolonged adm. b. Recurred 03/2013.  . Cardiac tamponade    a. 01/2013 s/p drain.  . Coronary artery disease    a. s/p MI in 2009 in [place] with stenting of the LCX and LAD;  b. 12/2012 NSTEMI/CAD: LM nl, LAD patent prox stent, LCX 50-70 isr (FFR 0.84), RCA dom, 178m EF 40-45%-->Med Rx. c. 01/2013: anterolateral STEMI complicated by pericardial effusion (presumed purulent pericarditis) and tamponade s/p drain, ruptured LV pseudoaneurysm s/p CorMatrix patch, CABGx1 (SVG-OM1), fever, CVA, VDRF, C diff.  . CVA (cerebral infarction)    a. 01/2013 in setting of prolonged hospitalization - residual L arm weakness.  . 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    a. 03/2013: readm with hypoxia, tachycardia, elevated ESR/CRP, elevated troponin with 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and myopericarditis; treated with  steroids.  . Hyperlipidemia   . Hypokalemia   . Hyponatremia    a. During late 2014.  . Ischemic cardiomyopathy    a. Sept/Oct 2014: EF ~40% (ICM). b. 03/2013: EF 25-30%. Off ACEI due to hypotension (MIXED NICM/ICM).  .[place]ocardial infarction ([place]  . Pericardial effusion    a. 01/2013: pericardial effusion (presumed purulent pericarditis) and cardiac tamponade s/p drain. b. Persistent moderate pericardial effusion 03/2013.  . Pre-diabetes   . Pseudoaneurysm of left ventricle of heart    a. 01/2013:  Ruptured inferoposterior LV pseudoaneurysm s/p CorMatrix patch.  . Sinus bradycardia    asymptomatic  . Stroke ([facility] 2009   weak lt side-lt arm  . Tobacco abuse    a. quit 12/2012.  . Wears dentures    top    Past Surgical History:  Procedure Laterality Date  . CARDIAC CATHETERIZATION  01/06/2013  . COLONOSCOPY WITH PROPOFOL N/A 07/03/2016   Procedure: COLONOSCOPY WITH PROPOFOL;  Surgeon: SManus Gunning MD;  [facility];  Service: Gastroenterology;  Laterality: N/A;  . CORONARY ANGIOPLASTY WITH STENT PLACEMENT  2000's X 2   "1 + 1" (01/06/2013)  . CORONARY ARTERY BYPASS GRAFT N/A 01/28/2013   Procedure: CORONARY ARTERY BYPASS GRAFTING (CABG);  Surgeon: SMelrose Nakayama MD;  [place]  Service: Open Heart Surgery;  Laterality: N/A;  CABG x one, using left greater saphenous  vein harvested endoscopically  . INTRAOPERATIVE TRANSESOPHAGEAL ECHOCARDIOGRAM N/A 01/28/2013   Procedure: INTRAOPERATIVE TRANSESOPHAGEAL ECHOCARDIOGRAM;  Surgeon: Melrose Nakayama, MD;  Location: [place];  Service: Open Heart Surgery;  Laterality: N/A;  . LEFT HEART CATHETERIZATION WITH CORONARY ANGIOGRAM N/A 01/06/2013   Procedure: LEFT HEART CATHETERIZATION WITH CORONARY ANGIOGRAM;  Surgeon: Peter M Martinique, MD;  Location: [facility] CATH LAB;  Service: Cardiovascular;  Laterality: N/A;  . LEFT HEART CATHETERIZATION WITH CORONARY ANGIOGRAM N/A 01/21/2013   Procedure: LEFT HEART  CATHETERIZATION WITH CORONARY ANGIOGRAM;  Surgeon: Burnell Blanks, MD;  Location[facility] CATH LAB;  Service: Cardiovascular;  Laterality: N/A;  . LUMBAR LAMINECTOMY/ DECOMPRESSION WITH MET-RX Right 06/30/2018   Procedure: Right minimally invasive Lumbar Three-Four Far lateral discectomy;  Surgeon: Judith Part, MD;  Location: [place];  Service: Neurosurgery;  Laterality: Right;  Right minimally invasive Lumbar Three-Four Far lateral discectomy  . MASS EXCISION Right 07/21/2014   Procedure: EXCISION RIGHT NECK MASS;  Surgeon: Erroll Luna, MD;  Location: [place];  Service: General;  Laterality: Right;  . PERICARDIAL TAP N/A 01/24/2013   Procedure: PERICARDIAL TAP;  Surgeon: Blane Ohara, MD;  Location: [facility] CATH LAB;  Service: Cardiovascular;  Laterality: N/A;  . RIGHT HEART CATHETERIZATION Right 01/24/2013   Procedure: RIGHT HEART CATH;  Surgeon: Blane Ohara, MD;  Location: [facility] CATH LAB;  Service: Cardiovascular;  Laterality: Right;  . VENTRICULAR ANEURYSM RESECTION N/A 01/28/2013   Procedure: LEFT VENTRICULAR ANEURYSM REPAIR;  Surgeon: Melrose Nakayama, MD;  Location: [place];  Service: Open Heart Surgery;  Laterality: N/A;    There were no vitals filed for this visit.  Subjective Assessment - 03/09/19 1232    Subjective  Pt reports he fell when getting up from a seated position saturday. He fell onto right hip and is having increased right hip and right low back pain today.    Currently in Pain?  Yes    Pain Score  8     Pain Location  Back    Pain Orientation  Right    Pain Descriptors / Indicators  Aching    Pain Radiating Towards  right hip    Aggravating Factors   falling    Pain Relieving Factors  resting                       [facility] Adult PT Treatment/Exercise - 03/09/19 0001      Lumbar Exercises: Stretches   Active Hamstring Stretch  Right;2 reps;30 seconds    Active Hamstring Stretch Limitations  strap    Single Knee to Chest  Stretch  Right;Left;3 reps;20 seconds    Lower Trunk Rotation  10 seconds    Lower Trunk Rotation Limitations  10 reps      Lumbar Exercises: Aerobic   Nustep  UE/LE L6  5 min      Lumbar Exercises: Standing   Heel Raises Limitations  pain with heel raises x 10    Functional Squats  10 reps    Functional Squats Limitations  at counter, cues for hip hinge    Other Standing Lumbar Exercises  lumbar extension at counter     Other Standing Lumbar Exercises  standing 3 way hip x 20 each bilat - no wt due to increased pain today       Lumbar Exercises: Seated   Long Arc Quad on Chair  Right;Left;20 reps    LAQ on Chair Weights (lbs)  3  Lumbar Exercises: Supine   Pelvic Tilt  10 reps    Pelvic Tilt Limitations  partial head and necklift    Bridge  10 reps               PT Short Term Goals - 02/23/19 1508      PT SHORT TERM GOAL #1   Title  He will be indpendent with initiAL Hep    Status  Achieved      PT SHORT TERM GOAL #2   Title  He will report 10% decr in RT hip/back pain    Baseline  overall 10%    Status  Achieved        PT Long Term Goals - 01/27/19 1313      PT LONG TERM GOAL #1   Title  He will be indpendent with all HEP issued    Baseline  Indepndent with initial hEP    Time  6    Period  Weeks    Status  New      PT LONG TERM GOAL #2   Title  He will report pain decreased 50% or more in RT hip and back and be intermittant    Baseline  constant pain    Time  6    Period  Weeks    Status  New      PT LONG TERM GOAL #3   Title  He will report able to lye on rT side for short periods due to decrased pain    Baseline  unable to lye on RT side to sleep    Time  6    Period  Weeks    Status  New      PT LONG TERM GOAL #4   Title  He will report greater time sitting to 30 minor more with support  before pain starts    Baseline  5 min with weight to LT hip    Time  6    Period  Weeks    Status  New      PT LONG TERM GOAL #5   Title  He  will be able to walk without SPC in comunity    Baseline  uses SPC to ambulate out of home.    Time  6    Period  Weeks    Status  New      Additional Long Term Goals   Additional Long Term Goals  Yes      PT LONG TERM GOAL #6   Title  He will report getting to standing from sitting is 50% or more easier    Baseline  He reports difficulty getting out of chair and has falen while doing this in past    Time  6    Period  Weeks    Status  New            Plan - 03/09/19 1340    Clinical Impression Statement  Less intensity of therex and no weights due to recent fall onto right side. He denied the need for MD examination. HMP to lumbar at end of session. He tolerated session well.    PT Next Visit Plan  Add to HEPwip and thigh strength. ,   Manual and modalities as needed; has his pain decreased since he fell?    PT Home Exercise Plan  Knee to chest, LTR,  piriformis, PPT, bent knee raise, seated H/s stretch, hooklying isometric hip flexion unilateral and opposites, sit-stand (  Elevated)       Patient will benefit from skilled therapeutic intervention in order to improve the following deficits and impairments:  Pain, Postural dysfunction, Increased muscle spasms, Decreased activity tolerance, Decreased range of motion, Decreased strength, Difficulty walking  Visit Diagnosis: Radiculopathy, lumbar region  Joint stiffness of spine  Abnormal posture  Muscle spasm of back     Problem List Patient Active Problem List   Diagnosis Date Noted  . Lumbar radiculopathy 12/04/2018  . Glaucoma 12/04/2018  . Cortical age-related cataract of both eyes 12/04/2018  . Pain management contract agreement 12/04/2018  . Lumbar herniated disc 06/04/2018  . Prediabetes 01/01/2018  . Hyperlipidemia 12/30/2017  . Obesity (BMI 30.0-34.9) 12/30/2017  . Benign neoplasm of descending colon   . Essential hypertension 11/03/2015  . Cardiomyopathy, ischemic 06/03/2015  . Chronic systolic  dysfunction of left ventricle 06/03/2015  . Erectile dysfunction due to arterial insufficiency 12/23/2014  . Lipoma of skin and subcutaneous tissue of neck 05/24/2014  . History of cardioembolic stroke 50/51/0712  . Vision loss of right eye 06/23/2013  . Dyslipidemia 06/22/2013  . 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([place])   . Coronary artery disease   . Cardiomyopathy ([place])   . Pseudoaneurysm of left ventricle of heart   . Tobacco abuse     Dorene Ar, [place] 03/09/2019, 1:41 PM  [facility] [address] [place], [place], [address] Phone: [phone]   Fax:  [phone]  Name: MELESIO MADARA MRN: [id number] Date of Birth: Oct 17, 1960

## 2019-03-11 ENCOUNTER — Telehealth: Payer: Self-pay

## 2019-03-11 NOTE — Telephone Encounter (Signed)
Patient assistance form for [employer] completed and faxed to [facility] at fax # [phone].

## 2019-03-16 ENCOUNTER — Ambulatory Visit: Payer: Medicare Other | Admitting: Physical Therapy

## 2019-03-17 ENCOUNTER — Ambulatory Visit: Payer: Medicare Other

## 2019-03-23 ENCOUNTER — Ambulatory Visit: Payer: Medicare Other | Attending: Neurological Surgery

## 2019-03-23 ENCOUNTER — Other Ambulatory Visit: Payer: Self-pay

## 2019-03-23 DIAGNOSIS — M5416 Radiculopathy, lumbar region: Secondary | ICD-10-CM

## 2019-03-23 DIAGNOSIS — M62830 Muscle spasm of back: Secondary | ICD-10-CM

## 2019-03-23 DIAGNOSIS — M2560 Stiffness of unspecified joint, not elsewhere classified: Secondary | ICD-10-CM | POA: Insufficient documentation

## 2019-03-23 DIAGNOSIS — R293 Abnormal posture: Secondary | ICD-10-CM | POA: Diagnosis present

## 2019-03-23 NOTE — Therapy (Signed)
[place], [place], [address] Phone: [phone]   Fax:  [phone]  Physical Therapy Treatment  Patient Details  Name: [name] MRN: [id number] Date of Birth: October 21, 1960 Referring Provider (PT): Emelda Brothers, MD   Encounter Date: 03/23/2019  PT End of Session - 03/23/19 1533    Visit Number  8    Number of Visits  12    Date for PT Re-Evaluation  04/05/19    Authorization Type  MCD    Authorization Time Period  until 12/20    Authorization - Visit Number  4    Authorization - Number of Visits  6    PT Start Time  0325    PT Stop Time  0410    PT Time Calculation (min)  45 min    Activity Tolerance  Patient tolerated treatment well    Behavior During Therapy  [facility] for tasks assessed/performed       Past Medical History:  Diagnosis Date  . Abnormal EKG    hx of ischemia showing up on ekg's  . Acute myopericarditis    a. 03/2013: readm with hypoxia, tachycardia, elevated ESR/CRP, elevated troponin with 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and myopericarditis; treated with steroids.  . Acute respiratory failure ([place])    a. 01/2013: VDRF. b. 03/2013: hypoxia requiring supp O2 during adm, resolved by discharge.  . C. difficile colitis    a. 01/2013 during prolonged adm. b. Recurred 03/2013.  . Cardiac tamponade    a. 01/2013 s/p drain.  . Coronary artery disease    a. s/p MI in 2009 in [place] with stenting of the LCX and LAD;  b. 12/2012 NSTEMI/CAD: LM nl, LAD patent prox stent, LCX 50-70 isr (FFR 0.84), RCA dom, 121m EF 40-45%-->Med Rx. c. 01/2013: anterolateral STEMI complicated by pericardial effusion (presumed purulent pericarditis) and tamponade s/p drain, ruptured LV pseudoaneurysm s/p CorMatrix patch, CABGx1 (SVG-OM1), fever, CVA, VDRF, C diff.  . CVA (cerebral infarction)    a. 01/2013 in setting of prolonged hospitalization - residual L arm weakness.  . 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    a. 03/2013: readm with  hypoxia, tachycardia, elevated ESR/CRP, elevated troponin with 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and myopericarditis; treated with steroids.  . Hyperlipidemia   . Hypokalemia   . Hyponatremia    a. During late 2014.  . Ischemic cardiomyopathy    a. Sept/Oct 2014: EF ~40% (ICM). b. 03/2013: EF 25-30%. Off ACEI due to hypotension (MIXED NICM/ICM).  .[place]ocardial infarction ([place]  . Pericardial effusion    a. 01/2013: pericardial effusion (presumed purulent pericarditis) and cardiac tamponade s/p drain. b. Persistent moderate pericardial effusion 03/2013.  . Pre-diabetes   . Pseudoaneurysm of left ventricle of heart    a. 01/2013:  Ruptured inferoposterior LV pseudoaneurysm s/p CorMatrix patch.  . Sinus bradycardia    asymptomatic  . Stroke ([facility] 2009   weak lt side-lt arm  . Tobacco abuse    a. quit 12/2012.  . Wears dentures    top    Past Surgical History:  Procedure Laterality Date  . CARDIAC CATHETERIZATION  01/06/2013  . COLONOSCOPY WITH PROPOFOL N/A 07/03/2016   Procedure: COLONOSCOPY WITH PROPOFOL;  Surgeon: SManus Gunning MD;  Location: [facility];  Service: Gastroenterology;  Laterality: N/A;  . CORONARY ANGIOPLASTY WITH STENT PLACEMENT  2000's X 2   "1 + 1" (01/06/2013)  . CORONARY ARTERY BYPASS GRAFT N/A 01/28/2013   Procedure: CORONARY ARTERY BYPASS GRAFTING (CABG);  Surgeon: SMelrose Nakayama  MD;  Location: [facility] OR;  Service: Open Heart Surgery;  Laterality: N/A;  CABG x one, using left greater saphenous vein harvested endoscopically  . INTRAOPERATIVE TRANSESOPHAGEAL ECHOCARDIOGRAM N/A 01/28/2013   Procedure: INTRAOPERATIVE TRANSESOPHAGEAL ECHOCARDIOGRAM;  Surgeon: Melrose Nakayama, MD;  Location: [place];  Service: Open Heart Surgery;  Laterality: N/A;  . LEFT HEART CATHETERIZATION WITH CORONARY ANGIOGRAM N/A 01/06/2013   Procedure: LEFT HEART CATHETERIZATION WITH CORONARY ANGIOGRAM;  Surgeon: Peter M Martinique, MD;  Location: [facility] CATH LAB;  Service: Cardiovascular;   Laterality: N/A;  . LEFT HEART CATHETERIZATION WITH CORONARY ANGIOGRAM N/A 01/21/2013   Procedure: LEFT HEART CATHETERIZATION WITH CORONARY ANGIOGRAM;  Surgeon: Burnell Blanks, MD;  Location: [facility] CATH LAB;  Service: Cardiovascular;  Laterality: N/A;  . LUMBAR LAMINECTOMY/ DECOMPRESSION WITH MET-RX Right 06/30/2018   Procedure: Right minimally invasive Lumbar Three-Four Far lateral discectomy;  Surgeon: Judith Part, MD;  Location: [place];  Service: Neurosurgery;  Laterality: Right;  Right minimally invasive Lumbar Three-Four Far lateral discectomy  . MASS EXCISION Right 07/21/2014   Procedure: EXCISION RIGHT NECK MASS;  Surgeon: Erroll Luna, MD;  Location: [place];  Service: General;  Laterality: Right;  . PERICARDIAL TAP N/A 01/24/2013   Procedure: PERICARDIAL TAP;  Surgeon: Blane Ohara, MD;  Location: [facility] CATH LAB;  Service: Cardiovascular;  Laterality: N/A;  . RIGHT HEART CATHETERIZATION Right 01/24/2013   Procedure: RIGHT HEART CATH;  Surgeon: Blane Ohara, MD;  Location[facility] CATH LAB;  Service: Cardiovascular;  Laterality: Right;  . VENTRICULAR ANEURYSM RESECTION N/A 01/28/2013   Procedure: LEFT VENTRICULAR ANEURYSM REPAIR;  Surgeon: Melrose Nakayama, MD;  Location: [place];  Service: Open Heart Surgery;  Laterality: N/A;    There were no vitals filed for this visit.  Subjective Assessment - 03/23/19 1535    Subjective  Doing better since last session   Pain 3/10 today Still feels weak RT side and bending hard /pain.    Pain Score  3     Pain Location  Back    Pain Orientation  Right    Pain Descriptors / Indicators  Aching    Pain Radiating Towards  RT hip    Pain Onset  More than a month ago    Pain Frequency  Constant    Aggravating Factors   bending , wakes from sleep, walking  1/2 mile or more    Pain Relieving Factors  rest.                       [facility] Adult PT Treatment/Exercise - 03/23/19 0001      Self-Care    Self-Care  Posture    Posture  worked on sitting posture with support to spine to keep lower spine in neutral postion so transiton from sit to stand is less drastiv flexion to extension.       Lumbar Exercises: Stretches   Active Hamstring Stretch  Right;3 reps;30 seconds    Single Knee to Chest Stretch  Right;3 reps;30 seconds    Lower Trunk Rotation  10 seconds;3 reps    Lower Trunk Rotation Limitations  to LT       Lumbar Exercises: Aerobic   Nustep  UE/LE L4  6 min      Lumbar Exercises: Seated   Long Arc Quad on Chair  Right;Left;20 reps    LAQ on Chair Weights (lbs)  5      Lumbar Exercises: Supine   Pelvic Tilt  10 reps    Pelvic Tilt Limitations  5 sec hold    Bridge  15 reps      Manual Therapy   Manual therapy comments  RT leg pull    Joint Mobilization  RT lumbar G3-4 x 30-45 reps each    Passive ROM  trunk rotation                PT Short Term Goals - 03/23/19 1542      PT SHORT TERM GOAL #1   Title  He will be indpendent with initiAL Hep      PT SHORT TERM GOAL #2   Title  He will report 10% decr in RT hip/back pain    Baseline  overall 10%    Status  Achieved        PT Long Term Goals - 01/27/19 1313      PT LONG TERM GOAL #1   Title  He will be indpendent with all HEP issued    Baseline  Indepndent with initial hEP    Time  6    Period  Weeks    Status  New      PT LONG TERM GOAL #2   Title  He will report pain decreased 50% or more in RT hip and back and be intermittant    Baseline  constant pain    Time  6    Period  Weeks    Status  New      PT LONG TERM GOAL #3   Title  He will report able to lye on rT side for short periods due to decrased pain    Baseline  unable to lye on RT side to sleep    Time  6    Period  Weeks    Status  New      PT LONG TERM GOAL #4   Title  He will report greater time sitting to 30 minor more with support  before pain starts    Baseline  5 min with weight to LT hip    Time  6    Period  Weeks     Status  New      PT LONG TERM GOAL #5   Title  He will be able to walk without SPC in comunity    Baseline  uses SPC to ambulate out of home.    Time  6    Period  Weeks    Status  New      Additional Long Term Goals   Additional Long Term Goals  Yes      PT LONG TERM GOAL #6   Title  He will report getting to standing from sitting is 50% or more easier    Baseline  He reports difficulty getting out of chair and has falen while doing this in past    Time  6    Period  Weeks    Status  New            Plan - 03/23/19 1606    Clinical Impression Statement  Continue next week and probale into 2021 for strengthening . He wants to decr to 1x/week if able .Will plan on finishing  final 2 auth viists. Will add ater that next week if he can get in    PT Treatment/Interventions  Dry needling;Passive range of motion;Manual techniques;Patient/family education;Therapeutic activities;Gait training;Moist Heat;Iontophoresis 55m/ml Dexamethasone;Electrical Stimulation;Therapeutic exercise    PT Next Visit Plan  Add  to HEP hip and thigh strength. ,   Manual and modalities as needed; has his pain decreased since he fell?    PT Home Exercise Plan  Knee to chest, LTR,  piriformis, PPT, bent knee raise, seated H/s stretch, hooklying isometric hip flexion unilateral and opposites, sit-stand (Elevated)    Consulted and Agree with Plan of Care  Patient       Patient will benefit from skilled therapeutic intervention in order to improve the following deficits and impairments:  Pain, Postural dysfunction, Increased muscle spasms, Decreased activity tolerance, Decreased range of motion, Decreased strength, Difficulty walking  Visit Diagnosis: Radiculopathy, lumbar region  Joint stiffness of spine  Abnormal posture  Muscle spasm of back     Problem List Patient Active Problem List   Diagnosis Date Noted  . Lumbar radiculopathy 12/04/2018  . Glaucoma 12/04/2018  . Cortical age-related  cataract of both eyes 12/04/2018  . Pain management contract agreement 12/04/2018  . Lumbar herniated disc 06/04/2018  . Prediabetes 01/01/2018  . Hyperlipidemia 12/30/2017  . Obesity (BMI 30.0-34.9) 12/30/2017  . Benign neoplasm of descending colon   . Essential hypertension 11/03/2015  . Cardiomyopathy, ischemic 06/03/2015  . Chronic systolic dysfunction of left ventricle 06/03/2015  . Erectile dysfunction due to arterial insufficiency 12/23/2014  . Lipoma of skin and subcutaneous tissue of neck 05/24/2014  . History of cardioembolic stroke 09/40/0050  . Vision loss of right eye 06/23/2013  . Dyslipidemia 06/22/2013  . 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([place])   . Coronary artery disease   . Cardiomyopathy ([place])   . Pseudoaneurysm of left ventricle of heart   . Tobacco abuse     Darrel Hoover  PT 03/23/2019, 4:09 PM  [facility] [address] [place], [place], [address] Phone: [phone]   Fax:  [phone]  Name: [name] MRN: [id number] Date of Birth: [birth year]

## 2019-03-25 ENCOUNTER — Ambulatory Visit (INDEPENDENT_AMBULATORY_CARE_PROVIDER_SITE_OTHER): Payer: Medicare Other | Admitting: Pharmacist

## 2019-03-25 ENCOUNTER — Other Ambulatory Visit: Payer: Self-pay

## 2019-03-25 VITALS — BP 124/76 | HR 62 | Wt 253.0 lb

## 2019-03-25 DIAGNOSIS — I519 Heart disease, unspecified: Secondary | ICD-10-CM

## 2019-03-25 DIAGNOSIS — I255 Ischemic cardiomyopathy: Secondary | ICD-10-CM

## 2019-03-25 NOTE — Patient Instructions (Signed)
Return for a  follow up appointment AS NEEDED  Go to the lab in [place] your blood pressure at home daily (if able) and keep record of the readings.  Take your BP meds as follows: *NO MEDICATION CHANGE*  Bring all of your meds, your BP cuff and your record of home blood pressures to your next appointment.  Exercise as you're able, try to walk approximately 30 minutes per day.  Keep salt intake to a minimum, especially watch canned and prepared boxed foods.  Eat more fresh fruits and vegetables and fewer canned items.  Avoid eating in fast food restaurants.    HOW TO TAKE YOUR BLOOD PRESSURE: . Rest 5 minutes before taking your blood pressure. .  Don't smoke or drink caffeinated beverages for at least 30 minutes before. . Take your blood pressure before (not after) you eat. . Sit comfortably with your back supported and both feet on the floor (don't cross your legs). . Elevate your arm to heart level on a table or a desk. . Use the proper sized cuff. It should fit smoothly and snugly around your bare upper arm. There should be enough room to slip a fingertip under the cuff. The bottom edge of the cuff should be 1 inch above the crease of the elbow. . Ideally, take 3 measurements at one sitting and record the average.

## 2019-03-25 NOTE — Progress Notes (Signed)
   HPI:  [name] is a 58 y.o. male patient of Dr Jordan, with a PMH below who presents today for heart failure (HFrEF) medication titration.   See pertinent past medical history below.    An Echo done on Oct 10 shows EF to be 25-30%. Patient was started on Entresto 49-51mg twice daily  Of 02/12/2019 and nicotine patches were prescribed to help with smoking cessation.  During las OV I increased his Entresto 97/103mg twice daily and was able to provide BP cuff for home use.  30 supply car d for Entresto was provided and Medicaid prior-authorization completed.  Today patient reports feeling well.  No complaints of chest pain, shortness of breath, increased fatigue or swelling.  Nicotine patches for smoking cessation not started yet.  Past Medical History: ASCVD Stents to LAD, left circumflex in 2007, CABG x 1 (SVG to OM 2014)  hypertension Per flowsheets looks well controlled on carvedilol 25 bid, lisinopril 10 bid  hyperlipidemia (9/19) TC 158, TG 89, HDL 57, LDL 83 - on atorvastatin 80  Cardioembolic stroke After LV pseudoaneurysm rupture   Pre-diabetes (6/19) A1c 6.1  glaucoma Alphagan, latanoprost     Blood Pressure Goal:  130/80  Current Medications:   Carvedilol 25 mg twice daily Entresto 97-103mg twice daily  Family Hx: mother - died at MI at 49; father died from Alzheimers  Siblings all healthy  Children healthy   Social Hx: smoking 1 ppd; no alcohol, only rare soda, mostly juices  Diet: some take out; occaisonal deep fried chicken; plenty of vegetables; snacks on cookies  Exercise: some limitations with walking (cane), currently getting PT  Home BP readings: 127/77 average from home - per patient recollection (no records provided)  Intolerances: nkda  Labs: 02/12/19:  Na 140, K 4.0, Glu 79, BUN 14, SCr 0.98  Wt Readings from Last 3 Encounters:  03/25/19 253 lb (114.8 kg)  02/24/19 246 lb 6.4 oz (111.8 kg)  02/16/19 243 lb (110.2 kg)   BP Readings from Last  3 Encounters:  03/25/19 124/76  02/24/19 136/82  02/16/19 133/86   Pulse Readings from Last 3 Encounters:  03/25/19 62  02/24/19 62  02/16/19 62    Current Outpatient Medications  Medication Sig Dispense Refill  . ALPHAGAN P 0.1 % SOLN Place 1 drop into both eyes 2 (two) times daily. 10 mL 0  . aspirin 81 MG EC tablet Take 1 tablet (81 mg total) by mouth daily. Restart on 07/05/2018 90 tablet 3  . atorvastatin (LIPITOR) 80 MG tablet Take 1 tablet (80 mg total) by mouth daily. 30 tablet 3  . carvedilol (COREG) 25 MG tablet Take 1 tablet (25 mg total) by mouth 2 (two) times daily with a meal. NEED OV. 60 tablet 5  . Cholecalciferol (VITAMIN D) 2000 units tablet Take 2,000 Units by mouth daily.    . latanoprost (XALATAN) 0.005 % ophthalmic solution Place 1 drop into both eyes daily.  12  . Multiple Vitamin (MULTIVITAMIN WITH MINERALS) TABS tablet Take 1 tablet by mouth daily.    . nicotine (NICODERM CQ - DOSED IN MG/24 HOURS) 14 mg/24hr patch Place 1 patch (14 mg total) onto the skin daily. 14 patch 0  . nicotine (NICODERM CQ - DOSED IN MG/24 HOURS) 21 mg/24hr patch Place 1 patch (21 mg total) onto the skin daily. 14 patch 2  . nicotine (NICODERM CQ - DOSED IN MG/24 HR) 7 mg/24hr patch Place 1 patch (7 mg total) onto the skin   daily. 14 patch 0  . nitroGLYCERIN (NITROSTAT) 0.4 MG SL tablet Place 1 tablet (0.4 mg total) under the tongue every 5 (five) minutes as needed for chest pain (up to 3 doses). 25 tablet 0  . sacubitril-valsartan (ENTRESTO) 97-103 MG Take 1 tablet by mouth 2 (two) times daily. 60 tablet 4   No current facility-administered medications for this visit.    No Known Allergies  Past Medical History:  Diagnosis Date  . Abnormal EKG    hx of ischemia showing up on ekg's  . Acute myopericarditis    a. 03/2013: readm with hypoxia, tachycardia, elevated ESR/CRP, elevated troponin with 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and myopericarditis; treated with steroids.  . Acute respiratory  failure ([facility])    a. 01/2013: VDRF. b. 03/2013: hypoxia requiring supp O2 during adm, resolved by discharge.  . C. difficile colitis    a. 01/2013 during prolonged adm. b. Recurred 03/2013.  . Cardiac tamponade    a. 01/2013 s/p drain.  . Coronary artery disease    a. s/p MI in 2009 in [place] with stenting of the LCX and LAD;  b. 12/2012 NSTEMI/CAD: LM nl, LAD patent prox stent, LCX 50-70 isr (FFR 0.84), RCA dom, 100m, EF 40-45%-->Med Rx. c. 01/2013: anterolateral STEMI complicated by pericardial effusion (presumed purulent pericarditis) and tamponade s/p drain, ruptured LV pseudoaneurysm s/p CorMatrix patch, CABGx1 (SVG-OM1), fever, CVA, VDRF, C diff.  . CVA (cerebral infarction)    a. 01/2013 in setting of prolonged hospitalization - residual L arm weakness.  . 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([facility])    a. 03/2013: readm with hypoxia, tachycardia, elevated ESR/CRP, elevated troponin with 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and myopericarditis; treated with steroids.  . Hyperlipidemia   . Hypokalemia   . Hyponatremia    a. During late 2014.  . Ischemic cardiomyopathy    a. Sept/Oct 2014: EF ~40% (ICM). b. 03/2013: EF 25-30%. Off ACEI due to hypotension (MIXED NICM/ICM).  . Myocardial infarction ([facility]) 2009  . Pericardial effusion    a. 01/2013: pericardial effusion (presumed purulent pericarditis) and cardiac tamponade s/p drain. b. Persistent moderate pericardial effusion 03/2013.  . Pre-diabetes   . Pseudoaneurysm of left ventricle of heart    a. 01/2013:  Ruptured inferoposterior LV pseudoaneurysm s/p CorMatrix patch.  . Sinus bradycardia    asymptomatic  . Stroke ([facility]) 2009   weak lt side-lt arm  . Tobacco abuse    a. quit 12/2012.  . Wears dentures    top    Blood pressure 124/76, pulse 62, weight 253 lb (114.8 kg), SpO2 98 %.  Chronic systolic dysfunction of left ventricle BP and HR well controlled with current therapy. Patient denies problems with current therapy and reports compliance. Still on contemplation  phase for smoking cessation. Will continue Entresto 97/103mg BID and Carvedilol 25mg BID. Repeat BMET today and follow up with Dr Jordan as previously scheduled (Jan/21/2021). Plan to add spironolactone 12.5mg daily if renal function and electrolytes stable.    Rodriguez-Guzman PharmD, BCPS, CPP [facility] [address] [place] [address] 03/30/2019 8:15 AM  

## 2019-03-26 LAB — BASIC METABOLIC PANEL
BUN/Creatinine Ratio: 9 (ref 9–20)
BUN: 8 mg/dL (ref 6–24)
CO2: 24 mmol/L (ref 20–29)
Calcium: 9.5 mg/dL (ref 8.7–10.2)
Chloride: 106 mmol/L (ref 96–106)
Creatinine, Ser: 0.9 mg/dL (ref 0.76–1.27)
GFR calc Af Amer: 109 mL/min/{1.73_m2} (ref >59)
GFR calc non Af Amer: 94 mL/min/{1.73_m2} (ref >59)
Glucose: 100 mg/dL — ABNORMAL HIGH (ref 65–99)
Potassium: 4.2 mmol/L (ref 3.5–5.2)
Sodium: 143 mmol/L (ref 134–144)

## 2019-03-30 ENCOUNTER — Encounter: Payer: Self-pay | Admitting: Pharmacist

## 2019-03-30 NOTE — Assessment & Plan Note (Signed)
BP and HR well controlled with current therapy. Patient denies problems with current therapy and reports compliance. Still on contemplation phase for smoking cessation. Will continue Entresto 97/103mg  BID and Carvedilol 25mg  BID. Repeat BMET today and follow up with Dr Martinique as previously scheduled (Jan/21/2021). Plan to add spironolactone 12.5mg  daily if renal function and electrolytes stable.

## 2019-03-31 ENCOUNTER — Other Ambulatory Visit: Payer: Self-pay

## 2019-03-31 ENCOUNTER — Ambulatory Visit: Payer: Medicare Other | Admitting: Physical Therapy

## 2019-03-31 ENCOUNTER — Encounter: Payer: Self-pay | Admitting: Physical Therapy

## 2019-03-31 DIAGNOSIS — M2560 Stiffness of unspecified joint, not elsewhere classified: Secondary | ICD-10-CM

## 2019-03-31 DIAGNOSIS — M5416 Radiculopathy, lumbar region: Secondary | ICD-10-CM | POA: Diagnosis not present

## 2019-03-31 DIAGNOSIS — R293 Abnormal posture: Secondary | ICD-10-CM

## 2019-03-31 DIAGNOSIS — M62830 Muscle spasm of back: Secondary | ICD-10-CM

## 2019-03-31 NOTE — Therapy (Signed)
[place], [place], [address] Phone: [phone]   Fax:  [phone]  Physical Therapy Treatment  Patient Details  Name: [name] MRN: [id number] Date of Birth: 12-08-1960 Referring Provider (PT): Emelda Brothers, MD   Encounter Date: 03/31/2019  PT End of Session - 03/31/19 1150    Visit Number  9    Number of Visits  12    Date for PT Re-Evaluation  04/05/19    Authorization Type  MCD    Authorization Time Period  until 12/20    Authorization - Visit Number  5    Authorization - Number of Visits  6    PT Start Time  0762    PT Stop Time  1240    PT Time Calculation (min)  55 min       Past Medical History:  Diagnosis Date  . Abnormal EKG    hx of ischemia showing up on ekg's  . Acute myopericarditis    a. 03/2013: readm with hypoxia, tachycardia, elevated ESR/CRP, elevated troponin with 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and myopericarditis; treated with steroids.  . Acute respiratory failure ([place])    a. 01/2013: VDRF. b. 03/2013: hypoxia requiring supp O2 during adm, resolved by discharge.  . C. difficile colitis    a. 01/2013 during prolonged adm. b. Recurred 03/2013.  . Cardiac tamponade    a. 01/2013 s/p drain.  . Coronary artery disease    a. s/p MI in 2009 in [place] with stenting of the LCX and LAD;  b. 12/2012 NSTEMI/CAD: LM nl, LAD patent prox stent, LCX 50-70 isr (FFR 0.84), RCA dom, 193m EF 40-45%-->Med Rx. c. 01/2013: anterolateral STEMI complicated by pericardial effusion (presumed purulent pericarditis) and tamponade s/p drain, ruptured LV pseudoaneurysm s/p CorMatrix patch, CABGx1 (SVG-OM1), fever, CVA, VDRF, C diff.  . CVA (cerebral infarction)    a. 01/2013 in setting of prolonged hospitalization - residual L arm weakness.  . 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([place]    a. 03/2013: readm with hypoxia, tachycardia, elevated ESR/CRP, elevated troponin with 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and myopericarditis; treated with  steroids.  . Hyperlipidemia   . Hypokalemia   . Hyponatremia    a. During late 2014.  . Ischemic cardiomyopathy    a. Sept/Oct 2014: EF ~40% (ICM). b. 03/2013: EF 25-30%. Off ACEI due to hypotension (MIXED NICM/ICM).  .[place] Kitchen[place]ocardial infarction ([place] 2009  . Pericardial effusion    a. 01/2013: pericardial effusion (presumed purulent pericarditis) and cardiac tamponade s/p drain. b. Persistent moderate pericardial effusion 03/2013.  . Pre-diabetes   . Pseudoaneurysm of left ventricle of heart    a. 01/2013:  Ruptured inferoposterior LV pseudoaneurysm s/p CorMatrix patch.  . Sinus bradycardia    asymptomatic  . Stroke ([facility] 2009   weak lt side-lt arm  . Tobacco abuse    a. quit 12/2012.  . Wears dentures    top    Past Surgical History:  Procedure Laterality Date  . CARDIAC CATHETERIZATION  01/06/2013  . COLONOSCOPY WITH PROPOFOL N/A 07/03/2016   Procedure: COLONOSCOPY WITH PROPOFOL;  Surgeon: SManus Gunning MD;  [facility]: [facility];  Service: Gastroenterology;  Laterality: N/A;  . CORONARY ANGIOPLASTY WITH STENT PLACEMENT  2000's X 2   "1 + 1" (01/06/2013)  . CORONARY ARTERY BYPASS GRAFT N/A 01/28/2013   Procedure: CORONARY ARTERY BYPASS GRAFTING (CABG);  Surgeon: SMelrose Nakayama MD;  [place]  Service: Open Heart Surgery;  Laterality: N/A;  CABG x one, using left greater saphenous  vein harvested endoscopically  . INTRAOPERATIVE TRANSESOPHAGEAL ECHOCARDIOGRAM N/A 01/28/2013   Procedure: INTRAOPERATIVE TRANSESOPHAGEAL ECHOCARDIOGRAM;  Surgeon: Melrose Nakayama, MD;  Location: [place];  Service: Open Heart Surgery;  Laterality: N/A;  . LEFT HEART CATHETERIZATION WITH CORONARY ANGIOGRAM N/A 01/06/2013   Procedure: LEFT HEART CATHETERIZATION WITH CORONARY ANGIOGRAM;  Surgeon: Robert M Martinique, MD;  Location[facility] CATH LAB;  Service: Cardiovascular;  Laterality: N/A;  . LEFT HEART CATHETERIZATION WITH CORONARY ANGIOGRAM N/A 01/21/2013   Procedure: LEFT HEART  CATHETERIZATION WITH CORONARY ANGIOGRAM;  Surgeon: Burnell Blanks, MD;  Location: [facility] CATH LAB;  Service: Cardiovascular;  Laterality: N/A;  . LUMBAR LAMINECTOMY/ DECOMPRESSION WITH MET-RX Right 06/30/2018   Procedure: Right minimally invasive Lumbar Three-Four Far lateral discectomy;  Surgeon: Judith Part, MD;  Location: [place];  Service: Neurosurgery;  Laterality: Right;  Right minimally invasive Lumbar Three-Four Far lateral discectomy  . MASS EXCISION Right 07/21/2014   Procedure: EXCISION RIGHT NECK MASS;  Surgeon: Erroll Luna, MD;  Location: [place];  Service: General;  Laterality: Right;  . PERICARDIAL TAP N/A 01/24/2013   Procedure: PERICARDIAL TAP;  Surgeon: Blane Ohara, MD;  Location: [facility] CATH LAB;  Service: Cardiovascular;  Laterality: N/A;  . RIGHT HEART CATHETERIZATION Right 01/24/2013   Procedure: RIGHT HEART CATH;  Surgeon: Blane Ohara, MD;  Location: [facility] CATH LAB;  Service: Cardiovascular;  Laterality: Right;  . VENTRICULAR ANEURYSM RESECTION N/A 01/28/2013   Procedure: LEFT VENTRICULAR ANEURYSM REPAIR;  Surgeon: Melrose Nakayama, MD;  Location: [place];  Service: Open Heart Surgery;  Laterality: N/A;    There were no vitals filed for this visit.  Subjective Assessment - 03/31/19 1149    Subjective  No pain now.    Currently in Pain?  No/denies                       [facility] Adult PT Treatment/Exercise - 03/31/19 0001      Lumbar Exercises: Stretches   Active Hamstring Stretch  --    Active Hamstring Stretch Limitations  --    Single Knee to Chest Stretch  Right;3 reps;30 seconds    Lower Trunk Rotation  10 seconds    Lower Trunk Rotation Limitations  10 reps      Lumbar Exercises: Aerobic   Nustep  L6 x 5 min LE only       Lumbar Exercises: Standing   Heel Raises  10 reps    Functional Squats  10 reps    Functional Squats Limitations  at counter, cues for hip hinge    Other Standing Lumbar Exercises  standing  hip flexion x 20 each       Lumbar Exercises: Seated   Sit to Stand  10 reps      Lumbar Exercises: Supine   Bridge  15 reps      Lumbar Exercises: Sidelying   Clam  Right;Left;10 reps    Hip Abduction  Right;Left;10 reps      Moist Heat Therapy   Number Minutes Moist Heat  15 Minutes    Moist Heat Location  Hip      Manual Therapy   Soft tissue mobilization  Massage roller to right lateral thigh and hip               PT Short Term Goals - 03/23/19 1542      PT SHORT TERM GOAL #1   Title  He will be indpendent with initiAL Hep  PT SHORT TERM GOAL #2   Title  He will report 10% decr in RT hip/back pain    Baseline  overall 10%    Status  Achieved        PT Long Term Goals - 03/31/19 1154      PT LONG TERM GOAL #1   Title  He will be indpendent with all HEP issued    Time  6    Period  Weeks    Status  On-going      PT LONG TERM GOAL #2   Title  He will report pain decreased 50% or more in RT hip and back and be intermittant    Baseline  25% pain decrease and intermittent    Time  6    Period  Weeks    Status  On-going      PT LONG TERM GOAL #3   Title  He will report able to lye on rT side for short periods due to decrased pain    Baseline  unable to lye on RT side to sleep    Time  6    Period  Weeks    Status  On-going      PT LONG TERM GOAL #4   Title  He will report greater time sitting to 30 minor more with support  before pain starts    Baseline  improved to 30 minutes    Time  6    Period  Weeks    Status  Achieved      PT LONG TERM GOAL #5   Title  He will be able to walk without SPC in comunity    Time  6    Period  Weeks    Status  Achieved      PT LONG TERM GOAL #6   Title  He will report getting to standing from sitting is 50% or more easier    Baseline  He reports difficulty getting out of chair and has fallen in the last 2 weeks doing this.    Time  6    Period  Weeks    Status  On-going            Plan -  03/31/19 1235    Clinical Impression Statement  Pt reports 25% decrease in pain. He is still unable to lye on right side.  He does report no longer using cane and he is able to sit comfortable for 30 minutes. LTG#4, #5 met.  Massage roller used on lateral hip. He has a small area of tenderness just distal to greater trochanter. [place] applied after manual.    PT Next Visit Plan  assess benefit of manual; one more visit in 2020 then he has a ERO scheduled for 2021, at that time he will have [place] and possibly medicaid secondary.    PT Home Exercise Plan  Knee to chest, LTR,  piriformis, PPT, bent knee raise, seated H/s stretch, hooklying isometric hip flexion unilateral and opposites, sit-stand (Elevated)       Patient will benefit from skilled therapeutic intervention in order to improve the following deficits and impairments:  Pain, Postural dysfunction, Increased muscle spasms, Decreased activity tolerance, Decreased range of motion, Decreased strength, Difficulty walking  Visit Diagnosis: Radiculopathy, lumbar region  Joint stiffness of spine  Abnormal posture  Muscle spasm of back     Problem List Patient Active Problem List   Diagnosis Date Noted  . Lumbar radiculopathy 12/04/2018  .  Glaucoma 12/04/2018  . Cortical age-related cataract of both eyes 12/04/2018  . Pain management contract agreement 12/04/2018  . Lumbar herniated disc 06/04/2018  . Prediabetes 01/01/2018  . Hyperlipidemia 12/30/2017  . Obesity (BMI 30.0-34.9) 12/30/2017  . Benign neoplasm of descending colon   . Essential hypertension 11/03/2015  . Cardiomyopathy, ischemic 06/03/2015  . Chronic systolic dysfunction of left ventricle 06/03/2015  . Erectile dysfunction due to arterial insufficiency 12/23/2014  . Lipoma of skin and subcutaneous tissue of neck 05/24/2014  . History of cardioembolic stroke 27/06/5007  . Vision loss of right eye 06/23/2013  . Dyslipidemia 06/22/2013  . 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([place])   . Coronary artery disease   . Cardiomyopathy ([place])   . Pseudoaneurysm of left ventricle of heart   . Tobacco abuse     Dorene Ar, [place] 03/31/2019, 1:07 PM  [facility] [address] [place], [place], [address] Phone: [phone]   Fax:  [phone]  Name: [name] MRN: [id number] Date of Birth: 09-28-1960

## 2019-04-03 ENCOUNTER — Encounter: Payer: Self-pay | Admitting: Physical Therapy

## 2019-04-03 ENCOUNTER — Other Ambulatory Visit: Payer: Self-pay

## 2019-04-03 ENCOUNTER — Ambulatory Visit: Payer: Medicare Other | Admitting: Physical Therapy

## 2019-04-03 DIAGNOSIS — M2560 Stiffness of unspecified joint, not elsewhere classified: Secondary | ICD-10-CM

## 2019-04-03 DIAGNOSIS — M5416 Radiculopathy, lumbar region: Secondary | ICD-10-CM | POA: Diagnosis not present

## 2019-04-03 DIAGNOSIS — R293 Abnormal posture: Secondary | ICD-10-CM

## 2019-04-03 DIAGNOSIS — M62830 Muscle spasm of back: Secondary | ICD-10-CM

## 2019-04-03 NOTE — Therapy (Signed)
[place], [place], [address] Phone: [phone]   Fax:  [phone]  Physical Therapy Treatment  Patient Details  Name: [name] MRN: [id number] Date of Birth: 1960-09-04 Referring Provider (PT): Emelda Brothers, MD   Encounter Date: 04/03/2019  PT End of Session - 04/03/19 0839    Visit Number  10    Number of Visits  12    Date for PT Re-Evaluation  04/05/19    Authorization Type  MCD    Authorization Time Period  until 12/20    Authorization - Visit Number  6    Authorization - Number of Visits  6    PT Start Time  0825    PT Stop Time  0920    PT Time Calculation (min)  55 min       Past Medical History:  Diagnosis Date  . Abnormal EKG    hx of ischemia showing up on ekg's  . Acute myopericarditis    a. 03/2013: readm with hypoxia, tachycardia, elevated ESR/CRP, elevated troponin with 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and myopericarditis; treated with steroids.  . Acute respiratory failure ([place])    a. 01/2013: VDRF. b. 03/2013: hypoxia requiring supp O2 during adm, resolved by discharge.  . C. difficile colitis    a. 01/2013 during prolonged adm. b. Recurred 03/2013.  . Cardiac tamponade    a. 01/2013 s/p drain.  . Coronary artery disease    a. s/p MI in 2009 in [place] with stenting of the LCX and LAD;  b. 12/2012 NSTEMI/CAD: LM nl, LAD patent prox stent, LCX 50-70 isr (FFR 0.84), RCA dom, 173m EF 40-45%-->Med Rx. c. 01/2013: anterolateral STEMI complicated by pericardial effusion (presumed purulent pericarditis) and tamponade s/p drain, ruptured LV pseudoaneurysm s/p CorMatrix patch, CABGx1 (SVG-OM1), fever, CVA, VDRF, C diff.  . CVA (cerebral infarction)    a. 01/2013 in setting of prolonged hospitalization - residual L arm weakness.  . 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([place]    a. 03/2013: readm with hypoxia, tachycardia, elevated ESR/CRP, elevated troponin with 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and myopericarditis; treated with  steroids.  . Hyperlipidemia   . Hypokalemia   . Hyponatremia    a. During late 2014.  . Ischemic cardiomyopathy    a. Sept/Oct 2014: EF ~40% (ICM). b. 03/2013: EF 25-30%. Off ACEI due to hypotension (MIXED NICM/ICM).  .Marland KitchenMyocardial infarction ([place] 2009  . Pericardial effusion    a. 01/2013: pericardial effusion (presumed purulent pericarditis) and cardiac tamponade s/p drain. b. Persistent moderate pericardial effusion 03/2013.  . Pre-diabetes   . Pseudoaneurysm of left ventricle of heart    a. 01/2013:  Ruptured inferoposterior LV pseudoaneurysm s/p CorMatrix patch.  . Sinus bradycardia    asymptomatic  . Stroke ([facility] 2009   weak lt side-lt arm  . Tobacco abuse    a. quit 12/2012.  . Wears dentures    top    Past Surgical History:  Procedure Laterality Date  . CARDIAC CATHETERIZATION  01/06/2013  . COLONOSCOPY WITH PROPOFOL N/A 07/03/2016   Procedure: COLONOSCOPY WITH PROPOFOL;  Surgeon: SManus Gunning MD;  [facility];  Service: Gastroenterology;  Laterality: N/A;  . CORONARY ANGIOPLASTY WITH STENT PLACEMENT  2000's X 2   "1 + 1" (01/06/2013)  . CORONARY ARTERY BYPASS GRAFT N/A 01/28/2013   Procedure: CORONARY ARTERY BYPASS GRAFTING (CABG);  Surgeon: SMelrose Nakayama MD;  [place]  Service: Open Heart Surgery;  Laterality: N/A;  CABG x one, using left greater saphenous  vein harvested endoscopically  . INTRAOPERATIVE TRANSESOPHAGEAL ECHOCARDIOGRAM N/A 01/28/2013   Procedure: INTRAOPERATIVE TRANSESOPHAGEAL ECHOCARDIOGRAM;  Surgeon: Melrose Nakayama, MD;  Location: [place];  Service: Open Heart Surgery;  Laterality: N/A;  . LEFT HEART CATHETERIZATION WITH CORONARY ANGIOGRAM N/A 01/06/2013   Procedure: LEFT HEART CATHETERIZATION WITH CORONARY ANGIOGRAM;  Surgeon: Peter M Martinique, MD;  Location: [facility] CATH LAB;  Service: Cardiovascular;  Laterality: N/A;  . LEFT HEART CATHETERIZATION WITH CORONARY ANGIOGRAM N/A 01/21/2013   Procedure: LEFT HEART  CATHETERIZATION WITH CORONARY ANGIOGRAM;  Surgeon: Burnell Blanks, MD;  Location: [facility] CATH LAB;  Service: Cardiovascular;  Laterality: N/A;  . LUMBAR LAMINECTOMY/ DECOMPRESSION WITH MET-RX Right 06/30/2018   Procedure: Right minimally invasive Lumbar Three-Four Far lateral discectomy;  Surgeon: Judith Part, MD;  Location: [place];  Service: Neurosurgery;  Laterality: Right;  Right minimally invasive Lumbar Three-Four Far lateral discectomy  . MASS EXCISION Right 07/21/2014   Procedure: EXCISION RIGHT NECK MASS;  Surgeon: Erroll Luna, MD;  Location: [place];  Service: General;  Laterality: Right;  . PERICARDIAL TAP N/A 01/24/2013   Procedure: PERICARDIAL TAP;  Surgeon: Blane Ohara, MD;  Location: [facility] CATH LAB;  Service: Cardiovascular;  Laterality: N/A;  . RIGHT HEART CATHETERIZATION Right 01/24/2013   Procedure: RIGHT HEART CATH;  Surgeon: Blane Ohara, MD;  Location: [facility] CATH LAB;  Service: Cardiovascular;  Laterality: Right;  . VENTRICULAR ANEURYSM RESECTION N/A 01/28/2013   Procedure: LEFT VENTRICULAR ANEURYSM REPAIR;  Surgeon: Melrose Nakayama, MD;  Location: [place];  Service: Open Heart Surgery;  Laterality: N/A;    There were no vitals filed for this visit.  Subjective Assessment - 04/03/19 0838    Subjective  A little pain 3/10.    Currently in Pain?  Yes    Pain Score  3     Pain Location  Back    Pain Orientation  Right    Pain Descriptors / Indicators  Aching    Pain Radiating Towards  right leg pan and numbness down to foot.    Aggravating Factors   bending, prolonged walking                       [facility] Adult PT Treatment/Exercise - 04/03/19 0001      Lumbar Exercises: Stretches   Single Knee to Chest Stretch  Right;3 reps;30 seconds    Lower Trunk Rotation  10 seconds    Lower Trunk Rotation Limitations  10 reps    Gastroc Stretch Limitations  slant board stretch       Lumbar Exercises: Aerobic   Nustep  L6 x 7  min LE only       Lumbar Exercises: Standing   Heel Raises  20 reps    Functional Squats  20 reps    Other Standing Lumbar Exercises  step up8 inch x 10 each and 1 UE support    Other Standing Lumbar Exercises  standing 3 way hip x 20 each bilat - no wt due to increased pain today       Lumbar Exercises: Seated   Sit to Stand  10 reps      Lumbar Exercises: Supine   Pelvic Tilt  10 reps    Pelvic Tilt Limitations  partial head and necklift    Bridge  20 reps    Straight Leg Raise  10 reps    Straight Leg Raises Limitations  with abdominal draw in  Moist Heat Therapy   Number Minutes Moist Heat  15 Minutes    Moist Heat Location  Hip      Manual Therapy   Soft tissue mobilization  Massage roller to right lateral thigh and hip               PT Short Term Goals - 03/23/19 1542      PT SHORT TERM GOAL #1   Title  He will be indpendent with initiAL Hep      PT SHORT TERM GOAL #2   Title  He will report 10% decr in RT hip/back pain    Baseline  overall 10%    Status  Achieved        PT Long Term Goals - 03/31/19 1154      PT LONG TERM GOAL #1   Title  He will be indpendent with all HEP issued    Time  6    Period  Weeks    Status  On-going      PT LONG TERM GOAL #2   Title  He will report pain decreased 50% or more in RT hip and back and be intermittant    Baseline  25% pain decrease and intermittent    Time  6    Period  Weeks    Status  On-going      PT LONG TERM GOAL #3   Title  He will report able to lye on rT side for short periods due to decrased pain    Baseline  unable to lye on RT side to sleep    Time  6    Period  Weeks    Status  On-going      PT LONG TERM GOAL #4   Title  He will report greater time sitting to 30 minor more with support  before pain starts    Baseline  improved to 30 minutes    Time  6    Period  Weeks    Status  Achieved      PT LONG TERM GOAL #5   Title  He will be able to walk without SPC in comunity     Time  6    Period  Weeks    Status  Achieved      PT LONG TERM GOAL #6   Title  He will report getting to standing from sitting is 50% or more easier    Baseline  He reports difficulty getting out of chair and has fallen in the last 2 weeks doing this.    Time  6    Period  Weeks    Status  On-going            Plan - 04/03/19 1245    Clinical Impression Statement  Pt reports manual last session was helpful for a short time. Continued with lumbar mobility and core and hip strength. No increased pain. Progressed with step ups for LE strength. HE tolerated well.    PT Next Visit Plan  ERO scheduled for 2021, at that time he will have [place] and possibly medicaid secondary.    PT Home Exercise Plan  Knee to chest, LTR,  piriformis, PPT, bent knee raise, seated H/s stretch, hooklying isometric hip flexion unilateral and opposites, sit-stand (Elevated)       Patient will benefit from skilled therapeutic intervention in order to improve the following deficits and impairments:  Pain, Postural dysfunction, Increased muscle spasms, Decreased activity tolerance,  Decreased range of motion, Decreased strength, Difficulty walking  Visit Diagnosis: Radiculopathy, lumbar region  Joint stiffness of spine  Abnormal posture  Muscle spasm of back     Problem List Patient Active Problem List   Diagnosis Date Noted  . Lumbar radiculopathy 12/04/2018  . Glaucoma 12/04/2018  . Cortical age-related cataract of both eyes 12/04/2018  . Pain management contract agreement 12/04/2018  . Lumbar herniated disc 06/04/2018  . Prediabetes 01/01/2018  . Hyperlipidemia 12/30/2017  . Obesity (BMI 30.0-34.9) 12/30/2017  . Benign neoplasm of descending colon   . Essential hypertension 11/03/2015  . Cardiomyopathy, ischemic 06/03/2015  . Chronic systolic dysfunction of left ventricle 06/03/2015  . Erectile dysfunction due to arterial insufficiency 12/23/2014  . Lipoma of skin and subcutaneous  tissue of neck 05/24/2014  . History of cardioembolic stroke 19/82/4299  . Vision loss of right eye 06/23/2013  . Dyslipidemia 06/22/2013  . 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([place])   . Coronary artery disease   . Cardiomyopathy ([place])   . Pseudoaneurysm of left ventricle of heart   . Tobacco abuse     Dorene Ar, [place] 04/03/2019, 9:24 AM  [facility] [address] [place], [place], [address] Phone: [phone]   Fax:  [phone]  Name: MAURICIO DAHLEN MRN: [id number] Date of Birth: [birth year]

## 2019-04-04 ENCOUNTER — Other Ambulatory Visit: Payer: Self-pay | Admitting: Internal Medicine

## 2019-04-04 DIAGNOSIS — H409 Unspecified glaucoma: Secondary | ICD-10-CM

## 2019-04-12 ENCOUNTER — Other Ambulatory Visit: Payer: Self-pay | Admitting: Internal Medicine

## 2019-04-12 DIAGNOSIS — E785 Hyperlipidemia, unspecified: Secondary | ICD-10-CM

## 2019-04-12 DIAGNOSIS — I25810 Atherosclerosis of coronary artery bypass graft(s) without angina pectoris: Secondary | ICD-10-CM

## 2019-04-20 ENCOUNTER — Ambulatory Visit: Payer: Medicare HMO | Attending: Neurological Surgery

## 2019-04-20 ENCOUNTER — Other Ambulatory Visit: Payer: Self-pay

## 2019-04-20 DIAGNOSIS — R293 Abnormal posture: Secondary | ICD-10-CM | POA: Diagnosis not present

## 2019-04-20 DIAGNOSIS — M62830 Muscle spasm of back: Secondary | ICD-10-CM | POA: Diagnosis not present

## 2019-04-20 DIAGNOSIS — M2560 Stiffness of unspecified joint, not elsewhere classified: Secondary | ICD-10-CM | POA: Insufficient documentation

## 2019-04-20 DIAGNOSIS — M5416 Radiculopathy, lumbar region: Secondary | ICD-10-CM

## 2019-04-20 DIAGNOSIS — M25551 Pain in right hip: Secondary | ICD-10-CM | POA: Insufficient documentation

## 2019-04-20 NOTE — Therapy (Signed)
[place], [place], [address] Phone: [phone]   Fax:  [phone]  Physical Therapy Treatment  Patient Details  Name: [name] MRN: [id number] Date of Birth: Dec 26, 1960 Referring Provider (PT): Emelda Brothers, MD   Encounter Date: 04/20/2019  PT End of Session - 04/20/19 1456    Visit Number  11    Date for PT Re-Evaluation  05/14/19    Authorization Type  MCR/MCD    PT Start Time  0245    PT Stop Time  0340    PT Time Calculation (min)  55 min    Activity Tolerance  Patient tolerated treatment well    Behavior During Therapy  [facility] for tasks assessed/performed       Past Medical History:  Diagnosis Date  . Abnormal EKG    hx of ischemia showing up on ekg's  . Acute myopericarditis    a. 03/2013: readm with hypoxia, tachycardia, elevated ESR/CRP, elevated troponin with 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and myopericarditis; treated with steroids.  . Acute respiratory failure ([place])    a. 01/2013: VDRF. b. 03/2013: hypoxia requiring supp O2 during adm, resolved by discharge.  . C. difficile colitis    a. 01/2013 during prolonged adm. b. Recurred 03/2013.  . Cardiac tamponade    a. 01/2013 s/p drain.  . Coronary artery disease    a. s/p MI in 2009 in [place] with stenting of the LCX and LAD;  b. 12/2012 NSTEMI/CAD: LM nl, LAD patent prox stent, LCX 50-70 isr (FFR 0.84), RCA dom, 154m EF 40-45%-->Med Rx. c. 01/2013: anterolateral STEMI complicated by pericardial effusion (presumed purulent pericarditis) and tamponade s/p drain, ruptured LV pseudoaneurysm s/p CorMatrix patch, CABGx1 (SVG-OM1), fever, CVA, VDRF, C diff.  . CVA (cerebral infarction)    a. 01/2013 in setting of prolonged hospitalization - residual L arm weakness.  . 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    a. 03/2013: readm with hypoxia, tachycardia, elevated ESR/CRP, elevated troponin with 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and myopericarditis; treated with steroids.  .  Hyperlipidemia   . Hypokalemia   . Hyponatremia    a. During late 2014.  . Ischemic cardiomyopathy    a. Sept/Oct 2014: EF ~40% (ICM). b. 03/2013: EF 25-30%. Off ACEI due to hypotension (MIXED NICM/ICM).  .[place]ocardial infarction ([place]  . Pericardial effusion    a. 01/2013: pericardial effusion (presumed purulent pericarditis) and cardiac tamponade s/p drain. b. Persistent moderate pericardial effusion 03/2013.  . Pre-diabetes   . Pseudoaneurysm of left ventricle of heart    a. 01/2013:  Ruptured inferoposterior LV pseudoaneurysm s/p CorMatrix patch.  . Sinus bradycardia    asymptomatic  . Stroke ([facility] 2009   weak lt side-lt arm  . Tobacco abuse    a. quit 12/2012.  . Wears dentures    top    Past Surgical History:  Procedure Laterality Date  . CARDIAC CATHETERIZATION  01/06/2013  . COLONOSCOPY WITH PROPOFOL N/A 07/03/2016   Procedure: COLONOSCOPY WITH PROPOFOL;  Surgeon: SManus Gunning MD;  [facility];  Service: Gastroenterology;  Laterality: N/A;  . CORONARY ANGIOPLASTY WITH STENT PLACEMENT  2000's X 2   "1 + 1" (01/06/2013)  . CORONARY ARTERY BYPASS GRAFT N/A 01/28/2013   Procedure: CORONARY ARTERY BYPASS GRAFTING (CABG);  Surgeon: SMelrose Nakayama MD;  [place]  Service: Open Heart Surgery;  Laterality: N/A;  CABG x one, using left greater saphenous vein harvested endoscopically  . INTRAOPERATIVE TRANSESOPHAGEAL ECHOCARDIOGRAM N/A 01/28/2013   Procedure: INTRAOPERATIVE TRANSESOPHAGEAL  ECHOCARDIOGRAM;  Surgeon: Melrose Nakayama, MD;  Location: [place];  Service: Open Heart Surgery;  Laterality: N/A;  . LEFT HEART CATHETERIZATION WITH CORONARY ANGIOGRAM N/A 01/06/2013   Procedure: LEFT HEART CATHETERIZATION WITH CORONARY ANGIOGRAM;  Surgeon: Peter M Martinique, MD;  Location: [facility] CATH LAB;  Service: Cardiovascular;  Laterality: N/A;  . LEFT HEART CATHETERIZATION WITH CORONARY ANGIOGRAM N/A 01/21/2013   Procedure: LEFT HEART CATHETERIZATION WITH  CORONARY ANGIOGRAM;  Surgeon: Burnell Blanks, MD;  Location: [facility] CATH LAB;  Service: Cardiovascular;  Laterality: N/A;  . LUMBAR LAMINECTOMY/ DECOMPRESSION WITH MET-RX Right 06/30/2018   Procedure: Right minimally invasive Lumbar Three-Four Far lateral discectomy;  Surgeon: Judith Part, MD;  Location: [place];  Service: Neurosurgery;  Laterality: Right;  Right minimally invasive Lumbar Three-Four Far lateral discectomy  . MASS EXCISION Right 07/21/2014   Procedure: EXCISION RIGHT NECK MASS;  Surgeon: Erroll Luna, MD;  Location: [place];  Service: General;  Laterality: Right;  . PERICARDIAL TAP N/A 01/24/2013   Procedure: PERICARDIAL TAP;  Surgeon: Blane Ohara, MD;  Location: [facility] CATH LAB;  Service: Cardiovascular;  Laterality: N/A;  . RIGHT HEART CATHETERIZATION Right 01/24/2013   Procedure: RIGHT HEART CATH;  Surgeon: Blane Ohara, MD;  Location: [facility] CATH LAB;  Service: Cardiovascular;  Laterality: Right;  . VENTRICULAR ANEURYSM RESECTION N/A 01/28/2013   Procedure: LEFT VENTRICULAR ANEURYSM REPAIR;  Surgeon: Melrose Nakayama, MD;  Location: [place];  Service: Open Heart Surgery;  Laterality: N/A;    There were no vitals filed for this visit.  Subjective Assessment - 04/20/19 1457    Subjective  Pain 5/10 RT hip and thigh. He said his pain is better.  No functional difference.    Currently in Pain?  Yes    Pain Score  5     Pain Location  Back    Pain Orientation  Right    Pain Descriptors / Indicators  Aching    Pain Type  Chronic pain    Pain Radiating Towards  Rt hip and thigh    Pain Onset  More than a month ago    Pain Frequency  Constant    Aggravating Factors   being on feet    Pain Relieving Factors  rest         [facility] PT Assessment - 04/20/19 0001      AROM   Lumbar Flexion  70    Lumbar Extension  20    Lumbar - Right Side Bend  18    Lumbar - Left Side Bend  12      PROM   Overall PROM Comments  Stiffness of hip rotaiton but  [facility]      Strength   Overall Strength Comments  Grossly LT and RT LE WNL. Some cogwheeling with LT leg testing      Decreased hip rotation with pain in RT hip             [facility] Adult PT Treatment/Exercise - 04/20/19 0001      Lumbar Exercises: Stretches   Single Knee to Chest Stretch  Right;Left;1 rep    Single Knee to Chest Stretch Limitations  2 min slightly lateral to shoulder      Lumbar Exercises: Aerobic   Nustep  L6 x 7 min LE only       Lumbar Exercises: Standing   Heel Raises  20 reps    Functional Squats  20 reps    Functional Squats Limitations  a t freemotion    Other Standing Lumbar Exercises  x 15 reps 3 way hip      Lumbar Exercises: Supine   Bridge  20 reps      Manual Therapy   Joint Mobilization  STW and mobs to hip /lower back and pelvis               PT Short Term Goals - 03/23/19 1542      PT SHORT TERM GOAL #1   Title  He will be indpendent with initiAL Hep      PT SHORT TERM GOAL #2   Title  He will report 10% decr in RT hip/back pain    Baseline  overall 10%    Status  Achieved        PT Long Term Goals - 04/20/19 1502      PT LONG TERM GOAL #1   Title  He will be indpendent with all HEP issued    Status  On-going      PT LONG TERM GOAL #2   Title  He will report pain decreased 50% or more in RT hip and back and be intermittant    Baseline  35%    Status  On-going      PT LONG TERM GOAL #3   Title  He will report able to lye on rT side for short periods due to decrased pain    Baseline  unable    Status  On-going      PT LONG TERM GOAL #4   Title  He will report greater time sitting to 30 minor more with support  before pain starts    Baseline  60 min    Status  Achieved      PT LONG TERM GOAL #5   Title  He will be able to walk without SPC in comunity    Status  Achieved      PT LONG TERM GOAL #6   Title  He will report getting to standing from sitting is 50% or more easier    Baseline  He reports  difficulty getting out of chair and has fallen in the last 2 weeks doing this. REports lost balance    Status  On-going            Plan - 04/20/19 1542    Clinical Impression Statement  MR Camera reports improvement in pain though remains constant and moderate in level. He reports no functional improvement and this may be related to RT hip papin and possible OS of that joint. I think he needs this assessed.    Personal Factors and Comorbidities  Past/Current Experience;Time since onset of injury/illness/exacerbation;Age    Examination-Activity Limitations  Stand;Bed Mobility;Locomotion Level;Bend;Carry;Sleep;Dressing;Stairs    Examination-Participation Restrictions  Art gallery manager;Yard Work;Laundry    PT Frequency  2x / week    PT Duration  3 weeks    PT Treatment/Interventions  Dry needling;Passive range of motion;Manual techniques;Patient/family education;Therapeutic activities;Gait training;Moist Heat;Iontophoresis 47m/ml Dexamethasone;Electrical Stimulation;Therapeutic exercise    PT Next Visit Plan  Continue Le strengthening.    PT Home Exercise Plan  Knee to chest, LTR,  piriformis, PPT, bent knee raise, seated H/s stretch, hooklying isometric hip flexion unilateral and opposites, sit-stand (Elevated)    Recommended Other Services  Asked Mr WCorreato see his PCP for assessment of his RT hip    Consulted and Agree with Plan of Care  Patient       Patient will benefit  from skilled therapeutic intervention in order to improve the following deficits and impairments:  Pain, Postural dysfunction, Increased muscle spasms, Decreased activity tolerance, Decreased range of motion, Decreased strength, Difficulty walking  Visit Diagnosis: Radiculopathy, lumbar region  Joint stiffness of spine  Abnormal posture  Muscle spasm of back     Problem List Patient Active Problem List   Diagnosis Date Noted  . Lumbar radiculopathy 12/04/2018  . Glaucoma 12/04/2018  .  Cortical age-related cataract of both eyes 12/04/2018  . Pain management contract agreement 12/04/2018  . Lumbar herniated disc 06/04/2018  . Prediabetes 01/01/2018  . Hyperlipidemia 12/30/2017  . Obesity (BMI 30.0-34.9) 12/30/2017  . Benign neoplasm of descending colon   . Essential hypertension 11/03/2015  . Cardiomyopathy, ischemic 06/03/2015  . Chronic systolic dysfunction of left ventricle 06/03/2015  . Erectile dysfunction due to arterial insufficiency 12/23/2014  . Lipoma of skin and subcutaneous tissue of neck 05/24/2014  . History of cardioembolic stroke 17/40/9927  . Vision loss of right eye 06/23/2013  . Dyslipidemia 06/22/2013  . 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([place])   . Coronary artery disease   . Cardiomyopathy ([place])   . Pseudoaneurysm of left ventricle of heart   . Tobacco abuse     Darrel Hoover  PT 04/20/2019, 3:46 PM  [facility] [address] [place], [place], [address] Phone: [phone]   Fax:  [phone]  Name: [name] MRN: [id number] Date of Birth: 03-17-1961

## 2019-04-27 ENCOUNTER — Other Ambulatory Visit: Payer: Self-pay

## 2019-04-27 ENCOUNTER — Ambulatory Visit: Payer: Medicare HMO

## 2019-04-27 DIAGNOSIS — R293 Abnormal posture: Secondary | ICD-10-CM

## 2019-04-27 DIAGNOSIS — M25551 Pain in right hip: Secondary | ICD-10-CM | POA: Diagnosis not present

## 2019-04-27 DIAGNOSIS — M62830 Muscle spasm of back: Secondary | ICD-10-CM | POA: Diagnosis not present

## 2019-04-27 DIAGNOSIS — M5416 Radiculopathy, lumbar region: Secondary | ICD-10-CM

## 2019-04-27 DIAGNOSIS — M2560 Stiffness of unspecified joint, not elsewhere classified: Secondary | ICD-10-CM

## 2019-04-27 NOTE — Therapy (Signed)
[place], [place], [address] Phone: [phone]   Fax:  [phone]  Physical Therapy Treatment  Patient Details  Name: [name] MRN: [id number] Date of Birth: [birth year] Referring Provider (PT): Emelda Brothers, MD   Encounter Date: 04/27/2019  PT End of Session - 04/27/19 1529    Visit Number  12    Number of Visits  18    Date for PT Re-Evaluation  05/14/19    Authorization Type  MCR/MCD    Authorization Time Period  until 12/20    PT Start Time  0330    PT Stop Time  0419    PT Time Calculation (min)  49 min    Activity Tolerance  Patient tolerated treatment well    Behavior During Therapy  [facility] for tasks assessed/performed       Past Medical History:  Diagnosis Date  . Abnormal EKG    hx of ischemia showing up on ekg's  . Acute myopericarditis    a. 03/2013: readm with hypoxia, tachycardia, elevated ESR/CRP, elevated troponin with 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and myopericarditis; treated with steroids.  . Acute respiratory failure ([place])    a. 01/2013: VDRF. b. 03/2013: hypoxia requiring supp O2 during adm, resolved by discharge.  . C. difficile colitis    a. 01/2013 during prolonged adm. b. Recurred 03/2013.  . Cardiac tamponade    a. 01/2013 s/p drain.  . Coronary artery disease    a. s/p MI in 2009 in [place] with stenting of the LCX and LAD;  b. 12/2012 NSTEMI/CAD: LM nl, LAD patent prox stent, LCX 50-70 isr (FFR 0.84), RCA dom, 12m EF 40-45%-->Med Rx. c. 01/2013: anterolateral STEMI complicated by pericardial effusion (presumed purulent pericarditis) and tamponade s/p drain, ruptured LV pseudoaneurysm s/p CorMatrix patch, CABGx1 (SVG-OM1), fever, CVA, VDRF, C diff.  . CVA (cerebral infarction)    a. 01/2013 in setting of prolonged hospitalization - residual L arm weakness.  . 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([place]    a. 03/2013: readm with hypoxia, tachycardia, elevated ESR/CRP, elevated troponin with Dressler  syndrome and myopericarditis; treated with steroids.  . Hyperlipidemia   . Hypokalemia   . Hyponatremia    a. During late 2014.  . Ischemic cardiomyopathy    a. Sept/Oct 2014: EF ~40% (ICM). b. 03/2013: EF 25-30%. Off ACEI due to hypotension (MIXED NICM/ICM).  .[place]ocardial infarction ([place]  . Pericardial effusion    a. 01/2013: pericardial effusion (presumed purulent pericarditis) and cardiac tamponade s/p drain. b. Persistent moderate pericardial effusion 03/2013.  . Pre-diabetes   . Pseudoaneurysm of left ventricle of heart    a. 01/2013:  Ruptured inferoposterior LV pseudoaneurysm s/p CorMatrix patch.  . Sinus bradycardia    asymptomatic  . Stroke ([facility] 2009   weak lt side-lt arm  . Tobacco abuse    a. quit 12/2012.  . Wears dentures    top    Past Surgical History:  Procedure Laterality Date  . CARDIAC CATHETERIZATION  01/06/2013  . COLONOSCOPY WITH PROPOFOL N/A 07/03/2016   Procedure: COLONOSCOPY WITH PROPOFOL;  Surgeon: SManus Gunning MD;  [facility];  Service: Gastroenterology;  Laterality: N/A;  . CORONARY ANGIOPLASTY WITH STENT PLACEMENT  2000's X 2   "1 + 1" (01/06/2013)  . CORONARY ARTERY BYPASS GRAFT N/A 01/28/2013   Procedure: CORONARY ARTERY BYPASS GRAFTING (CABG);  Surgeon: SMelrose Nakayama MD;  [place]  Service: Open Heart Surgery;  Laterality: N/A;  CABG x one, using left  greater saphenous vein harvested endoscopically  . INTRAOPERATIVE TRANSESOPHAGEAL ECHOCARDIOGRAM N/A 01/28/2013   Procedure: INTRAOPERATIVE TRANSESOPHAGEAL ECHOCARDIOGRAM;  Surgeon: Melrose Nakayama, MD;  Location: [place];  Service: Open Heart Surgery;  Laterality: N/A;  . LEFT HEART CATHETERIZATION WITH CORONARY ANGIOGRAM N/A 01/06/2013   Procedure: LEFT HEART CATHETERIZATION WITH CORONARY ANGIOGRAM;  Surgeon: Peter M Martinique, MD;  Location: [facility] CATH LAB;  Service: Cardiovascular;  Laterality: N/A;  . LEFT HEART CATHETERIZATION WITH CORONARY ANGIOGRAM N/A  01/21/2013   Procedure: LEFT HEART CATHETERIZATION WITH CORONARY ANGIOGRAM;  Surgeon: Burnell Blanks, MD;  Location: [facility] CATH LAB;  Service: Cardiovascular;  Laterality: N/A;  . LUMBAR LAMINECTOMY/ DECOMPRESSION WITH MET-RX Right 06/30/2018   Procedure: Right minimally invasive Lumbar Three-Four Far lateral discectomy;  Surgeon: Judith Part, MD;  Location: [place];  Service: Neurosurgery;  Laterality: Right;  Right minimally invasive Lumbar Three-Four Far lateral discectomy  . MASS EXCISION Right 07/21/2014   Procedure: EXCISION RIGHT NECK MASS;  Surgeon: Erroll Luna, MD;  Location: [place];  Service: General;  Laterality: Right;  . PERICARDIAL TAP N/A 01/24/2013   Procedure: PERICARDIAL TAP;  Surgeon: Blane Ohara, MD;  Location: [facility] CATH LAB;  Service: Cardiovascular;  Laterality: N/A;  . RIGHT HEART CATHETERIZATION Right 01/24/2013   Procedure: RIGHT HEART CATH;  Surgeon: Blane Ohara, MD;  Location: [facility] CATH LAB;  Service: Cardiovascular;  Laterality: Right;  . VENTRICULAR ANEURYSM RESECTION N/A 01/28/2013   Procedure: LEFT VENTRICULAR ANEURYSM REPAIR;  Surgeon: Melrose Nakayama, MD;  Location: [place];  Service: Open Heart Surgery;  Laterality: N/A;    There were no vitals filed for this visit.  Subjective Assessment - 04/27/19 1536    Subjective  No change in pain  still in RT hip and thigh. Will see PCP for assessmentof RT hip    Pain Score  5     Pain Location  Hip    Pain Orientation  Right;Lateral    Pain Descriptors / Indicators  Aching    Pain Type  Chronic pain    Pain Radiating Towards  thigh    Pain Onset  More than a month ago    Pain Frequency  Constant    Aggravating Factors   being on feet    Pain Relieving Factors  rest                       [facility] Adult PT Treatment/Exercise - 04/27/19 0001      Lumbar Exercises: Standing   Wall Slides  10 reps    Wall Slides Limitations  2 sets    Other Standing Lumbar  Exercises  x 15 reps march/ abduction hip  3 # each leg and knee flexion      Lumbar Exercises: Seated   Long Arc Quad on Chair  Right;Left;20 reps    Sit to Stand  10 reps    Sit to Stand Limitations  2 sets from chair with 2 pillows added    Other Seated Lumbar Exercises  clams x20 blue band      Moist Heat Therapy   Number Minutes Moist Heat  10 Minutes    Moist Heat Location  Hip   RT            PT Education - 04/27/19 1615    Education Details  HEP    Person(s) Educated  Patient    Methods  Explanation;Tactile cues;Verbal cues;Handout    Comprehension  Verbalized  understanding;Returned demonstration       PT Short Term Goals - 04/27/19 1534      PT SHORT TERM GOAL #1   Title  He will be indpendent with initiAL Hep    Status  Achieved      PT SHORT TERM GOAL #2   Title  He will report 10% decr in RT hip/back pain    Status  Achieved        PT Long Term Goals - 04/20/19 1502      PT LONG TERM GOAL #1   Title  He will be indpendent with all HEP issued    Status  On-going      PT LONG TERM GOAL #2   Title  He will report pain decreased 50% or more in RT hip and back and be intermittant    Baseline  35%    Status  On-going      PT LONG TERM GOAL #3   Title  He will report able to lye on rT side for short periods due to decrased pain    Baseline  unable    Status  On-going      PT LONG TERM GOAL #4   Title  He will report greater time sitting to 30 minor more with support  before pain starts    Baseline  60 min    Status  Achieved      PT LONG TERM GOAL #5   Title  He will be able to walk without SPC in comunity    Status  Achieved      PT LONG TERM GOAL #6   Title  He will report getting to standing from sitting is 50% or more easier    Baseline  He reports difficulty getting out of chair and has fallen in the last 2 weeks doing this. REports lost balance    Status  On-going            Plan - 04/27/19 1533    Clinical Impression  Statement  Decr pain with heat but no changes in RT hip pain overall. He has appointment with [place] for possible xray. Tolerated all exercises and added band with standing exercises for home.    PT Treatment/Interventions  Dry needling;Passive range of motion;Manual techniques;Patient/family education;Therapeutic activities;Gait training;Moist Heat;Iontophoresis 42m/ml Dexamethasone;Electrical Stimulation;Therapeutic exercise    PT Next Visit Plan  Continue LE strengthening.    PT Home Exercise Plan  Knee to chest, LTR,  piriformis, PPT, bent knee raise, seated H/s stretch, hooklying isometric hip flexion unilateral and opposites, sit-stand (Elevated), side stepping with blue band. , marching, standing    Consulted and Agree with Plan of Care  Patient       Patient will benefit from skilled therapeutic intervention in order to improve the following deficits and impairments:  Pain, Postural dysfunction, Increased muscle spasms, Decreased activity tolerance, Decreased range of motion, Decreased strength, Difficulty walking  Visit Diagnosis: Radiculopathy, lumbar region  Joint stiffness of spine  Abnormal posture  Muscle spasm of back  Pain in right hip     Problem List Patient Active Problem List   Diagnosis Date Noted  . Lumbar radiculopathy 12/04/2018  . Glaucoma 12/04/2018  . Cortical age-related cataract of both eyes 12/04/2018  . Pain management contract agreement 12/04/2018  . Lumbar herniated disc 06/04/2018  . Prediabetes 01/01/2018  . Hyperlipidemia 12/30/2017  . Obesity (BMI 30.0-34.9) 12/30/2017  . Benign neoplasm of descending colon   . Essential hypertension 11/03/2015  .  Cardiomyopathy, ischemic 06/03/2015  . Chronic systolic dysfunction of left ventricle 06/03/2015  . Erectile dysfunction due to arterial insufficiency 12/23/2014  . Lipoma of skin and subcutaneous tissue of neck 05/24/2014  . History of cardioembolic stroke 20/01/711  . Vision loss of right eye  06/23/2013  . Dyslipidemia 06/22/2013  . 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 Coronary artery disease   . Cardiomyopathy ([place])   . Pseudoaneurysm of left ventricle of heart   . Tobacco abuse     Darrel Hoover  PT 04/27/2019, 4:31 PM  [facility] [address] [place], [place], [address] Phone: [phone]   Fax:  [phone]  Name: [name] MRN: [id number] Date of Birth: [birth year]

## 2019-04-27 NOTE — Patient Instructions (Signed)
Side stepping and marching and hip ext stand with blue band.   X 10 reps 2 sets daily 4-5x/week.  Hold 1-2 sec

## 2019-04-28 ENCOUNTER — Other Ambulatory Visit: Payer: Self-pay

## 2019-04-28 ENCOUNTER — Telehealth: Payer: Self-pay | Admitting: Cardiology

## 2019-04-28 ENCOUNTER — Ambulatory Visit: Payer: Medicare HMO

## 2019-04-28 DIAGNOSIS — M62830 Muscle spasm of back: Secondary | ICD-10-CM | POA: Diagnosis not present

## 2019-04-28 DIAGNOSIS — M25551 Pain in right hip: Secondary | ICD-10-CM

## 2019-04-28 DIAGNOSIS — M2560 Stiffness of unspecified joint, not elsewhere classified: Secondary | ICD-10-CM | POA: Diagnosis not present

## 2019-04-28 DIAGNOSIS — R293 Abnormal posture: Secondary | ICD-10-CM | POA: Diagnosis not present

## 2019-04-28 DIAGNOSIS — M5416 Radiculopathy, lumbar region: Secondary | ICD-10-CM | POA: Diagnosis not present

## 2019-04-28 NOTE — Therapy (Signed)
[place], [place], [address] Phone: [phone]   Fax:  [phone]  Physical Therapy Treatment  Patient Details  Name: [name] MRN: [id number] Date of Birth: [birth year] Referring Provider (PT): Emelda Brothers, MD   Encounter Date: 04/28/2019  PT End of Session - 04/28/19 1606    Visit Number  13    Number of Visits  18    Date for PT Re-Evaluation  05/14/19    Authorization Type  MCR/MCD    Authorization Time Period  progress note at visit 20    PT Start Time  0330    PT Stop Time  0419    PT Time Calculation (min)  49 min    Activity Tolerance  Patient tolerated treatment well    Behavior During Therapy  [facility] for tasks assessed/performed       Past Medical History:  Diagnosis Date  . Abnormal EKG    hx of ischemia showing up on ekg's  . Acute myopericarditis    a. 03/2013: readm with hypoxia, tachycardia, elevated ESR/CRP, elevated troponin with 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and myopericarditis; treated with steroids.  . Acute respiratory failure ([place])    a. 01/2013: VDRF. b. 03/2013: hypoxia requiring supp O2 during adm, resolved by discharge.  . C. difficile colitis    a. 01/2013 during prolonged adm. b. Recurred 03/2013.  . Cardiac tamponade    a. 01/2013 s/p drain.  . Coronary artery disease    a. s/p MI in 2009 in [place] with stenting of the LCX and LAD;  b. 12/2012 NSTEMI/CAD: LM nl, LAD patent prox stent, LCX 50-70 isr (FFR 0.84), RCA dom, 124m EF 40-45%-->Med Rx. c. 01/2013: anterolateral STEMI complicated by pericardial effusion (presumed purulent pericarditis) and tamponade s/p drain, ruptured LV pseudoaneurysm s/p CorMatrix patch, CABGx1 (SVG-OM1), fever, CVA, VDRF, C diff.  . CVA (cerebral infarction)    a. 01/2013 in setting of prolonged hospitalization - residual L arm weakness.  . 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([place]    a. 03/2013: readm with hypoxia, tachycardia, elevated ESR/CRP, elevated troponin  with 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and myopericarditis; treated with steroids.  . Hyperlipidemia   . Hypokalemia   . Hyponatremia    a. During late 2014.  . Ischemic cardiomyopathy    a. Sept/Oct 2014: EF ~40% (ICM). b. 03/2013: EF 25-30%. Off ACEI due to hypotension (MIXED NICM/ICM).  .[place]ocardial infarction ([place]  . Pericardial effusion    a. 01/2013: pericardial effusion (presumed purulent pericarditis) and cardiac tamponade s/p drain. b. Persistent moderate pericardial effusion 03/2013.  . Pre-diabetes   . Pseudoaneurysm of left ventricle of heart    a. 01/2013:  Ruptured inferoposterior LV pseudoaneurysm s/p CorMatrix patch.  . Sinus bradycardia    asymptomatic  . Stroke ([facility] 2009   weak lt side-lt arm  . Tobacco abuse    a. quit 12/2012.  . Wears dentures    top    Past Surgical History:  Procedure Laterality Date  . CARDIAC CATHETERIZATION  01/06/2013  . COLONOSCOPY WITH PROPOFOL N/A 07/03/2016   Procedure: COLONOSCOPY WITH PROPOFOL;  Surgeon: SManus Gunning MD;  [facility];  Service: Gastroenterology;  Laterality: N/A;  . CORONARY ANGIOPLASTY WITH STENT PLACEMENT  2000's X 2   "1 + 1" (01/06/2013)  . CORONARY ARTERY BYPASS GRAFT N/A 01/28/2013   Procedure: CORONARY ARTERY BYPASS GRAFTING (CABG);  Surgeon: SMelrose Nakayama MD;  [place]  Service: Open Heart Surgery;  Laterality: N/A;  CABG x  one, using left greater saphenous vein harvested endoscopically  . INTRAOPERATIVE TRANSESOPHAGEAL ECHOCARDIOGRAM N/A 01/28/2013   Procedure: INTRAOPERATIVE TRANSESOPHAGEAL ECHOCARDIOGRAM;  Surgeon: Melrose Nakayama, MD;  Location: [place];  Service: Open Heart Surgery;  Laterality: N/A;  . LEFT HEART CATHETERIZATION WITH CORONARY ANGIOGRAM N/A 01/06/2013   Procedure: LEFT HEART CATHETERIZATION WITH CORONARY ANGIOGRAM;  Surgeon: Peter M Martinique, MD;  Location: [facility] CATH LAB;  Service: Cardiovascular;  Laterality: N/A;  . LEFT HEART CATHETERIZATION WITH CORONARY  ANGIOGRAM N/A 01/21/2013   Procedure: LEFT HEART CATHETERIZATION WITH CORONARY ANGIOGRAM;  Surgeon: Burnell Blanks, MD;  Location: [facility] CATH LAB;  Service: Cardiovascular;  Laterality: N/A;  . LUMBAR LAMINECTOMY/ DECOMPRESSION WITH MET-RX Right 06/30/2018   Procedure: Right minimally invasive Lumbar Three-Four Far lateral discectomy;  Surgeon: Judith Part, MD;  Location: [place];  Service: Neurosurgery;  Laterality: Right;  Right minimally invasive Lumbar Three-Four Far lateral discectomy  . MASS EXCISION Right 07/21/2014   Procedure: EXCISION RIGHT NECK MASS;  Surgeon: Erroll Luna, MD;  Location: [place];  Service: General;  Laterality: Right;  . PERICARDIAL TAP N/A 01/24/2013   Procedure: PERICARDIAL TAP;  Surgeon: Blane Ohara, MD;  Location: [facility] CATH LAB;  Service: Cardiovascular;  Laterality: N/A;  . RIGHT HEART CATHETERIZATION Right 01/24/2013   Procedure: RIGHT HEART CATH;  Surgeon: Blane Ohara, MD;  Location: [facility] CATH LAB;  Service: Cardiovascular;  Laterality: Right;  . VENTRICULAR ANEURYSM RESECTION N/A 01/28/2013   Procedure: LEFT VENTRICULAR ANEURYSM REPAIR;  Surgeon: Melrose Nakayama, MD;  Location: [place];  Service: Open Heart Surgery;  Laterality: N/A;    There were no vitals filed for this visit.  Subjective Assessment - 04/28/19 1611    Subjective  Back sore from sink squats at thome    Pain Score  4     Pain Location  Back    Pain Orientation  Left    Pain Descriptors / Indicators  Aching    Pain Type  Chronic pain    Pain Onset  More than a month ago    Pain Frequency  Intermittent                       [facility] Adult PT Treatment/Exercise - 04/28/19 0001      Lumbar Exercises: Supine   Bridge  20 reps    Other Supine Lumbar Exercises  bsall squeeze and green band pulls x 20 each      Moist Heat Therapy   Number Minutes Moist Heat  10 Minutes    Moist Heat Location  Lumbar Spine      Manual Therapy   Joint  Mobilization  RT hip   gr 4 for flexin and rotation and  leg pulls.  also set with belt distraction to hip  active ROM      Nustep LE L5 6 min         PT Short Term Goals - 04/27/19 1534      PT SHORT TERM GOAL #1   Title  He will be indpendent with initiAL Hep    Status  Achieved      PT SHORT TERM GOAL #2   Title  He will report 10% decr in RT hip/back pain    Status  Achieved        PT Long Term Goals - 04/20/19 1502      PT LONG TERM GOAL #1   Title  He will be indpendent  with all HEP issued    Status  On-going      PT LONG TERM GOAL #2   Title  He will report pain decreased 50% or more in RT hip and back and be intermittant    Baseline  35%    Status  On-going      PT LONG TERM GOAL #3   Title  He will report able to lye on rT side for short periods due to decrased pain    Baseline  unable    Status  On-going      PT LONG TERM GOAL #4   Title  He will report greater time sitting to 30 minor more with support  before pain starts    Baseline  60 min    Status  Achieved      PT LONG TERM GOAL #5   Title  He will be able to walk without SPC in comunity    Status  Achieved      PT LONG TERM GOAL #6   Title  He will report getting to standing from sitting is 50% or more easier    Baseline  He reports difficulty getting out of chair and has fallen in the last 2 weeks doing this. REports lost balance    Status  On-going            Plan - 04/28/19 1606    Clinical Impression Statement  Hip was better with min to nopain but lower back sore today so heat on this .  Will continue with hip ROM and look at back agian   Back sore from sink squat at thome so asked him to stop this exercise    PT Treatment/Interventions  Dry needling;Passive range of motion;Manual techniques;Patient/family education;Therapeutic activities;Gait training;Moist Heat;Iontophoresis 68m/ml Dexamethasone;Electrical Stimulation;Therapeutic exercise    PT Next Visit Plan  Continue LE/core  strengthening.  hip ROM /mobs   stop sink squat at home    PT Home Exercise Plan  Knee to chest, LTR,  piriformis, PPT, bent knee raise, seated H/s stretch, hooklying isometric hip flexion unilateral and opposites, sit-stand (Elevated), side stepping with blue band. , marching, standing    Consulted and Agree with Plan of Care  Patient       Patient will benefit from skilled therapeutic intervention in order to improve the following deficits and impairments:  Pain, Postural dysfunction, Increased muscle spasms, Decreased activity tolerance, Decreased range of motion, Decreased strength, Difficulty walking  Visit Diagnosis: Joint stiffness of spine  Abnormal posture  Muscle spasm of back  Pain in right hip     Problem List Patient Active Problem List   Diagnosis Date Noted  . Lumbar radiculopathy 12/04/2018  . Glaucoma 12/04/2018  . Cortical age-related cataract of both eyes 12/04/2018  . Pain management contract agreement 12/04/2018  . Lumbar herniated disc 06/04/2018  . Prediabetes 01/01/2018  . Hyperlipidemia 12/30/2017  . Obesity (BMI 30.0-34.9) 12/30/2017  . Benign neoplasm of descending colon   . Essential hypertension 11/03/2015  . Cardiomyopathy, ischemic 06/03/2015  . Chronic systolic dysfunction of left ventricle 06/03/2015  . Erectile dysfunction due to arterial insufficiency 12/23/2014  . Lipoma of skin and subcutaneous tissue of neck 05/24/2014  . History of cardioembolic stroke [id number] . Vision loss of right eye 06/23/2013  . Dyslipidemia 06/22/2013  . 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([place]   . Coronary artery disease   . Cardiomyopathy ([place]   . Pseudoaneurysm of left ventricle of heart   . Tobacco abuse  Darrel Hoover  PT 04/28/2019, 4:24 PM  [facility] [address] [place], [place], [address] Phone: [phone]   Fax:  [phone]  Name: [name] MRN: [id number] Date of Birth:  1961/04/08

## 2019-04-28 NOTE — Telephone Encounter (Signed)
Pt calling about bringing his fiance to his upcoming appt. Informed pt of the current visitor restrictions. Pt states he has a doctors note saying that he requires an escort. I advised pt to bring the note to the appt and he verbalized understanding.

## 2019-04-28 NOTE — Telephone Encounter (Signed)
New Message   Patient is calling to advise that his fiance will need to be with him at his appt to assist him. Please advise.

## 2019-04-30 ENCOUNTER — Telehealth: Payer: Self-pay

## 2019-04-30 ENCOUNTER — Telehealth: Payer: Self-pay | Admitting: Internal Medicine

## 2019-04-30 MED ORDER — SPIRONOLACTONE 25 MG PO TABS: 25.0000 mg | ORAL_TABLET | Freq: Every day | ORAL | 3 refills | Status: DC

## 2019-04-30 NOTE — Progress Notes (Signed)
Date:  05/07/2019   ID:  [name], DOB 1961/01/25, MRN [id number]  PCP:  Ladell Pier, MD  Cardiologist:  Shakirra Buehler Martinique, MD  Electrophysiologist:  None   Evaluation Performed:  Follow-Up Visit  Chief Complaint:  CHF/CAD  History of Present Illness:    [name] is a 59 y.o. male with  PMH of hyperlipidemia,  CVA, prediabetes, CAD, ischemic cardiomyopathy, history of cardiac tamponade and  pericarditis.  He had stents to the left circumflex and LAD in 2007.  In September 2014, he presented with chest pain.  Repeat cardiac catheterization showed totally occluded RCA and a 50% in-stent restenosis of left circumflex stent.  Medical therapy was recommended.  He was admitted in October 2014 with a large pericardial effusion with tamponade due to ruptured LV pseudoaneurysm.  Pericardial drain was placed.  Prior to discharge, he developed signs of stroke with left arm weakness and left visual field deficit.  He then developed hemodynamic collapse and  required intubation.  Repeat echocardiogram showed complex pericardial effusion with right ventricular collapse and a tamponade.  He underwent emergent median sternotomy and repair of ruptured inferoseptal posterior LV pseudoaneurysm with a core matrix patch and CABG x1 with SVG to OM by Dr. Roxan Hockey.  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  His EF by echocardiogram at the time was 25% with moderate pericardial effusion.  He also had recurrent C. difficile infection as well.  Echocardiogram by August 2015 showed EF of 30%.  Echocardiogram in February 2017 showed EF of 30 to 35%.   He was seen by Almyra Deforest PA-C in March 2020 for pre op clearance for lumbar surgery.  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  he did undergo L3-4 discectomy without complication. Recent repeat Echo showed persistently low EF 25-30%.    On his visit in October I recommended more intensive CHF therapy. He was on Coreg 25 mg bid. In conjunction with our Pharm D he was switched to [facility]. Dose has been maximized. Renal function has been stable. He started on Aldactone this past week. He is tolerating these medications well. No chest pain, weight gain or edema. He is active. Only gets SOB with more strenuous activity.   Past Medical History:  Diagnosis Date  . Abnormal EKG    hx of ischemia showing up on ekg's  . Acute myopericarditis    a. 03/2013: readm with hypoxia, tachycardia, elevated ESR/CRP, elevated troponin with 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and myopericarditis; treated with steroids.  . Acute respiratory failure ([place])    a. 01/2013: VDRF. b. 03/2013: hypoxia requiring supp O2 during adm, resolved by discharge.  . C. difficile colitis    a. 01/2013 during prolonged adm. b. Recurred 03/2013.  . Cardiac tamponade    a. 01/2013 s/p drain.  . Coronary artery disease    a. s/p MI in 2009 in [place] with stenting of the LCX and LAD;  b. 12/2012 NSTEMI/CAD: LM nl, LAD patent prox stent, LCX 50-70 isr (FFR 0.84), RCA dom, 15m EF 40-45%-->Med Rx. c. 01/2013: anterolateral STEMI complicated by pericardial effusion (presumed purulent pericarditis) and tamponade s/p drain, ruptured LV pseudoaneurysm s/p CorMatrix patch, CABGx1 (SVG-OM1), fever, CVA, VDRF, C diff.  . CVA (cerebral infarction)    a. 01/2013 in setting of prolonged hospitalization - residual L arm weakness.  . 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([place]    a. 03/2013:  readm with hypoxia, tachycardia, elevated ESR/CRP, elevated troponin with 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and myopericarditis; treated with steroids.  . Hyperlipidemia   . Hypokalemia   . Hyponatremia    a. During late 2014.  . Ischemic cardiomyopathy    a. Sept/Oct 2014: EF ~40% (ICM). b. 03/2013: EF 25-30%. Off ACEI due to hypotension (MIXED NICM/ICM).  Marland Kitchen Myocardial infarction ([place]) 2009  . Pericardial effusion    a. 01/2013: pericardial  effusion (presumed purulent pericarditis) and cardiac tamponade s/p drain. b. Persistent moderate pericardial effusion 03/2013.  . Pre-diabetes   . Pseudoaneurysm of left ventricle of heart    a. 01/2013:  Ruptured inferoposterior LV pseudoaneurysm s/p CorMatrix patch.  . Sinus bradycardia    asymptomatic  . Stroke [facility]) 2009   weak lt side-lt arm  . Tobacco abuse    a. quit 12/2012.  . Wears dentures    top   Past Surgical History:  Procedure Laterality Date  . CARDIAC CATHETERIZATION  01/06/2013  . COLONOSCOPY WITH PROPOFOL N/A 07/03/2016   Procedure: COLONOSCOPY WITH PROPOFOL;  Surgeon: Manus Gunning, MD;  [facility];  Service: Gastroenterology;  Laterality: N/A;  . CORONARY ANGIOPLASTY WITH STENT PLACEMENT  2000's X 2   "1 + 1" (01/06/2013)  . CORONARY ARTERY BYPASS GRAFT N/A 01/28/2013   Procedure: CORONARY ARTERY BYPASS GRAFTING (CABG);  Surgeon: Melrose Nakayama, MD;  Location: [place];  Service: Open Heart Surgery;  Laterality: N/A;  CABG x one, using left greater saphenous vein harvested endoscopically  . INTRAOPERATIVE TRANSESOPHAGEAL ECHOCARDIOGRAM N/A 01/28/2013   Procedure: INTRAOPERATIVE TRANSESOPHAGEAL ECHOCARDIOGRAM;  Surgeon: Melrose Nakayama, MD;  Location: [place];  Service: Open Heart Surgery;  Laterality: N/A;  . LEFT HEART CATHETERIZATION WITH CORONARY ANGIOGRAM N/A 01/06/2013   Procedure: LEFT HEART CATHETERIZATION WITH CORONARY ANGIOGRAM;  Surgeon: Corley Kohls M Martinique, MD;  Location: [facility] CATH LAB;  Service: Cardiovascular;  Laterality: N/A;  . LEFT HEART CATHETERIZATION WITH CORONARY ANGIOGRAM N/A 01/21/2013   Procedure: LEFT HEART CATHETERIZATION WITH CORONARY ANGIOGRAM;  Surgeon: Burnell Blanks, MD;  Location: [facility] CATH LAB;  Service: Cardiovascular;  Laterality: N/A;  . LUMBAR LAMINECTOMY/ DECOMPRESSION WITH MET-RX Right 06/30/2018   Procedure: Right minimally invasive Lumbar Three-Four Far lateral discectomy;  Surgeon: Judith Part,  MD;  Location: [place];  Service: Neurosurgery;  Laterality: Right;  Right minimally invasive Lumbar Three-Four Far lateral discectomy  . MASS EXCISION Right 07/21/2014   Procedure: EXCISION RIGHT NECK MASS;  Surgeon: Erroll Luna, MD;  Location: [place];  Service: General;  Laterality: Right;  . PERICARDIAL TAP N/A 01/24/2013   Procedure: PERICARDIAL TAP;  Surgeon: Blane Ohara, MD;  Location: [facility] CATH LAB;  Service: Cardiovascular;  Laterality: N/A;  . RIGHT HEART CATHETERIZATION Right 01/24/2013   Procedure: RIGHT HEART CATH;  Surgeon: Blane Ohara, MD;  Location: [facility] CATH LAB;  Service: Cardiovascular;  Laterality: Right;  . VENTRICULAR ANEURYSM RESECTION N/A 01/28/2013   Procedure: LEFT VENTRICULAR ANEURYSM REPAIR;  Surgeon: Melrose Nakayama, MD;  Location: [place];  Service: Open Heart Surgery;  Laterality: N/A;     No outpatient medications have been marked as taking for the 05/07/19 encounter (Office Visit) with Martinique, Landra Howze M, MD.     Allergies:   Patient has no known allergies.   Social History   Tobacco Use  . Smoking status: Current Every Day Smoker    Packs/day: 1.50    Years: 35.00    Pack years: 52.50    Types: Cigarettes  .  Smokeless tobacco: Never Used  Substance Use Topics  . Alcohol use: No    Alcohol/week: 0.0 standard drinks  . Drug use: No     Family Hx: The patient's family history includes CAD in an other family member; Diabetes in his mother; HIV in his brother; Heart attack in his mother; Hypertension in his father and mother. There is no history of Stroke.  ROS:   Please see the history of present illness.   All other systems reviewed and are negative.   Prior CV studies:   The following studies were reviewed today:  Echo 01/26/19: IMPRESSIONS    1. Left ventricular ejection fraction, by visual estimation, is 25 to 30%. The left ventricle has normal function. Moderately increased left ventricular size. There is no left  ventricular hypertrophy.  2. There is akinesis of the inferior and nferolateral wall,.  3. Elevated left ventricular end-diastolic pressure.  4. Left ventricular diastolic Doppler parameters are consistent with restrictive filling pattern of LV diastolic filling.  5. Global right ventricle has normal systolic function.The right ventricular size is normal. No increase in right ventricular wall thickness.  6. Left atrial size was mildly dilated.  7. Right atrial size was normal.  8. The mitral valve is normal in structure. No evidence of mitral valve regurgitation. No evidence of mitral stenosis.  9. The tricuspid valve is normal in structure. Tricuspid valve regurgitation was not visualized by color flow Doppler. 10. The aortic valve is tricuspid Aortic valve regurgitation was not visualized by color flow Doppler. Mild aortic valve sclerosis without stenosis. 11. The pulmonic valve was normal in structure. Pulmonic valve regurgitation is trivial by color flow Doppler. 12. TR signal is inadequate for assessing pulmonary artery systolic pressure. 13. The inferior vena cava is dilated in size with <50% respiratory variability, suggesting right atrial pressure of 15 mmHg.  Labs/Other Tests and Data Reviewed:    EKG:  Today shows NSR rate 58. occ PVC. Old inferior infarct. ST T changes c/w lateral ischemia. Unchanged from prior. I have personally reviewed and interpreted this study.   Recent Labs: 06/30/2018: Hemoglobin 14.6; Platelets 142 02/12/2019: ALT 21; BNP 384.2; Magnesium 2.1 03/25/2019: BUN 8; Creatinine, Ser 0.90; Potassium 4.2; Sodium 143   Recent Lipid Panel Lab Results  Component Value Date/Time   CHOL 134 02/12/2019 02:36 PM   TRIG 100 02/12/2019 02:36 PM   HDL 53 02/12/2019 02:36 PM   CHOLHDL 2.5 02/12/2019 02:36 PM   CHOLHDL 2.5 11/03/2015 04:09 PM   LDLCALC 62 02/12/2019 02:36 PM    Wt Readings from Last 3 Encounters:  05/07/19 246 lb 12.8 oz (111.9 kg)  03/25/19 253 lb  (114.8 kg)  02/24/19 246 lb 6.4 oz (111.8 kg)     Objective:    Vital Signs:  BP 121/80   Pulse (!) 58   Temp (!) 96.8 F (36 C)   Ht 6' (1.829 m)   Wt 246 lb 12.8 oz (111.9 kg)   SpO2 98%   BMI 33.47 kg/m   GENERAL:  Well appearing BM, overweight HEENT:  PERRL, EOMI, sclera are clear. Oropharynx is clear. NECK:  No jugular venous distention, carotid upstroke brisk and symmetric, no bruits, no thyromegaly or adenopathy LUNGS:  Clear to auscultation bilaterally CHEST:  Unremarkable HEART:  RRR,  PMI not displaced or sustained,S1 and S2 within normal limits, no S3, no S4: no clicks, no rubs, no murmurs ABD:  Soft, nontender. BS +, no masses or bruits. No hepatomegaly, no splenomegaly EXT:  2 + pulses throughout, no edema, no cyanosis no clubbing SKIN:  Warm and dry.  No rashes NEURO:  Alert and oriented x 3. Cranial nerves II through XII intact. PSYCH:  Cognitively intact    ASSESSMENT & PLAN:    1. Chronic systolic CHF. EF 25-30% by most recent Echo. Symptoms improved to class 2.  Ischemic cardiomyopathy.Medical therapy has now been optimized. Will check BMET today. Repeat Echo in March. If he were to have significant symptoms we could evaluate for ischemia with Myoview. If EF remains low despite optimal medical therapy then placement of ICD also comes into play. Stressed the importance of low sodium diet.   2. CAD s/p remote stenting of LAD and LCx. CTO of the RCA. S/p CABG x 1 in 2014. See #1. No chest pain.   3. History of pericardial tamponade due to LV rupture. S/p patch repair in 2014. No recurrent effusion  4. History of CVA 2014  5. Hyperlipidemia- will update lab work  6. Pre diabetes.   7. Tobacco abuse recommend smoking cessation.   Follow up in 6 months.  Signed, Zanden Colver Martinique, MD  05/07/2019 3:10 PM    [facility]

## 2019-04-30 NOTE — Telephone Encounter (Signed)
Will forward to pcp

## 2019-04-30 NOTE — Telephone Encounter (Signed)
Patient called and requested a call back regarding his concern with the covid19 vaccine and his current medications if it would cause a reaction of some sort. Patient stated that he was interested in getting the vaccination and wanted to make sure his pcp thought it was a good idea. Please fu at your earliest convinence.

## 2019-04-30 NOTE — Telephone Encounter (Signed)
Spoke to patient Dr.Jordan advised to start taking spironolactone 25 mg daily.Advised renal panel was ok.Advised to keep appointment with Dr.Jordan 05/07/19 at 3:00 pm repeat, bmet to be done at visit.

## 2019-05-01 ENCOUNTER — Telehealth: Payer: Self-pay | Admitting: Cardiology

## 2019-05-01 NOTE — Telephone Encounter (Signed)
Per last telephone note -  Patient is requesting a call back from [place]. He states she advised him to take some new medication and he has been concerned about it ever since. Would like to speak with Malachy Mood about his concerns.

## 2019-05-01 NOTE — Telephone Encounter (Signed)
Patient called. I gave him Dr. Durenda Age message and he verbalized understanding. All questions and concerns were answered.

## 2019-05-01 NOTE — Telephone Encounter (Signed)
[name] may you call pt please

## 2019-05-01 NOTE — Telephone Encounter (Signed)
Spoke to patient he stated he wanted to make sure his kidneys were ok.Stated he does not have transportation to pick Aldactone.Stated she will start taking tomorrow.Advised recent renal panel was ok.Advised to take as prescribed.Keep appointment with Dr.Jordan as planned.

## 2019-05-04 ENCOUNTER — Ambulatory Visit: Payer: Medicare HMO

## 2019-05-04 ENCOUNTER — Other Ambulatory Visit: Payer: Self-pay

## 2019-05-04 DIAGNOSIS — M62830 Muscle spasm of back: Secondary | ICD-10-CM

## 2019-05-04 DIAGNOSIS — R293 Abnormal posture: Secondary | ICD-10-CM

## 2019-05-04 DIAGNOSIS — M25551 Pain in right hip: Secondary | ICD-10-CM | POA: Diagnosis not present

## 2019-05-04 DIAGNOSIS — M5416 Radiculopathy, lumbar region: Secondary | ICD-10-CM | POA: Diagnosis not present

## 2019-05-04 DIAGNOSIS — M2560 Stiffness of unspecified joint, not elsewhere classified: Secondary | ICD-10-CM | POA: Diagnosis not present

## 2019-05-04 NOTE — Therapy (Signed)
[place], [place], [address] Phone: [phone]   Fax:  [phone]  Physical Therapy Treatment  Patient Details  Name: [name] MRN: [id number] Date of Birth: 10-Apr-1961 Referring Provider (PT): Emelda Brothers, MD   Encounter Date: 05/04/2019  PT End of Session - 05/04/19 1613    Visit Number  14    Number of Visits  18    Date for PT Re-Evaluation  05/14/19    Authorization Type  MCR/MCD    Authorization Time Period  progress note at visit 20    PT Start Time  0408    PT Stop Time  0500    PT Time Calculation (min)  52 min       Past Medical History:  Diagnosis Date  . Abnormal EKG    hx of ischemia showing up on ekg's  . Acute myopericarditis    a. 03/2013: readm with hypoxia, tachycardia, elevated ESR/CRP, elevated troponin with 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and myopericarditis; treated with steroids.  . Acute respiratory failure ([place])    a. 01/2013: VDRF. b. 03/2013: hypoxia requiring supp O2 during adm, resolved by discharge.  . C. difficile colitis    a. 01/2013 during prolonged adm. b. Recurred 03/2013.  . Cardiac tamponade    a. 01/2013 s/p drain.  . Coronary artery disease    a. s/p MI in 2009 in [place] with stenting of the LCX and LAD;  b. 12/2012 NSTEMI/CAD: LM nl, LAD patent prox stent, LCX 50-70 isr (FFR 0.84), RCA dom, 153m EF 40-45%-->Med Rx. c. 01/2013: anterolateral STEMI complicated by pericardial effusion (presumed purulent pericarditis) and tamponade s/p drain, ruptured LV pseudoaneurysm s/p CorMatrix patch, CABGx1 (SVG-OM1), fever, CVA, VDRF, C diff.  . CVA (cerebral infarction)    a. 01/2013 in setting of prolonged hospitalization - residual L arm weakness.  . 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([place]    a. 03/2013: readm with hypoxia, tachycardia, elevated ESR/CRP, elevated troponin with 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and myopericarditis; treated with steroids.  . Hyperlipidemia   . Hypokalemia   .  Hyponatremia    a. During late 2014.  . Ischemic cardiomyopathy    a. Sept/Oct 2014: EF ~40% (ICM). b. 03/2013: EF 25-30%. Off ACEI due to hypotension (MIXED NICM/ICM).  .[place] Kitchen[place]ocardial infarction ([place] 2009  . Pericardial effusion    a. 01/2013: pericardial effusion (presumed purulent pericarditis) and cardiac tamponade s/p drain. b. Persistent moderate pericardial effusion 03/2013.  . Pre-diabetes   . Pseudoaneurysm of left ventricle of heart    a. 01/2013:  Ruptured inferoposterior LV pseudoaneurysm s/p CorMatrix patch.  . Sinus bradycardia    asymptomatic  . Stroke ([facility] 2009   weak lt side-lt arm  . Tobacco abuse    a. quit 12/2012.  . Wears dentures    top    Past Surgical History:  Procedure Laterality Date  . CARDIAC CATHETERIZATION  01/06/2013  . COLONOSCOPY WITH PROPOFOL N/A 07/03/2016   Procedure: COLONOSCOPY WITH PROPOFOL;  Surgeon: SManus Gunning MD;  [facility];  Service: Gastroenterology;  Laterality: N/A;  . CORONARY ANGIOPLASTY WITH STENT PLACEMENT  2000's X 2   "1 + 1" (01/06/2013)  . CORONARY ARTERY BYPASS GRAFT N/A 01/28/2013   Procedure: CORONARY ARTERY BYPASS GRAFTING (CABG);  Surgeon: SMelrose Nakayama MD;  [place]  Service: Open Heart Surgery;  Laterality: N/A;  CABG x one, using left greater saphenous vein harvested endoscopically  . INTRAOPERATIVE TRANSESOPHAGEAL ECHOCARDIOGRAM N/A 01/28/2013   Procedure: INTRAOPERATIVE TRANSESOPHAGEAL ECHOCARDIOGRAM;  Surgeon: Melrose Nakayama, MD;  Location: [place];  Service: Open Heart Surgery;  Laterality: N/A;  . LEFT HEART CATHETERIZATION WITH CORONARY ANGIOGRAM N/A 01/06/2013   Procedure: LEFT HEART CATHETERIZATION WITH CORONARY ANGIOGRAM;  Surgeon: Peter M Martinique, MD;  Location: [facility] CATH LAB;  Service: Cardiovascular;  Laterality: N/A;  . LEFT HEART CATHETERIZATION WITH CORONARY ANGIOGRAM N/A 01/21/2013   Procedure: LEFT HEART CATHETERIZATION WITH CORONARY ANGIOGRAM;  Surgeon: Burnell Blanks, MD;  Location: [facility] CATH LAB;  Service: Cardiovascular;  Laterality: N/A;  . LUMBAR LAMINECTOMY/ DECOMPRESSION WITH MET-RX Right 06/30/2018   Procedure: Right minimally invasive Lumbar Three-Four Far lateral discectomy;  Surgeon: Judith Part, MD;  Location: [place];  Service: Neurosurgery;  Laterality: Right;  Right minimally invasive Lumbar Three-Four Far lateral discectomy  . MASS EXCISION Right 07/21/2014   Procedure: EXCISION RIGHT NECK MASS;  Surgeon: Erroll Luna, MD;  Location: [place];  Service: General;  Laterality: Right;  . PERICARDIAL TAP N/A 01/24/2013   Procedure: PERICARDIAL TAP;  Surgeon: Blane Ohara, MD;  Location: [facility] CATH LAB;  Service: Cardiovascular;  Laterality: N/A;  . RIGHT HEART CATHETERIZATION Right 01/24/2013   Procedure: RIGHT HEART CATH;  Surgeon: Blane Ohara, MD;  Location: [facility] CATH LAB;  Service: Cardiovascular;  Laterality: Right;  . VENTRICULAR ANEURYSM RESECTION N/A 01/28/2013   Procedure: LEFT VENTRICULAR ANEURYSM REPAIR;  Surgeon: Melrose Nakayama, MD;  Location: [place];  Service: Open Heart Surgery;  Laterality: N/A;    There were no vitals filed for this visit.  Subjective Assessment - 05/04/19 1706    Subjective  no pain . still doing sink squats with some pain  other wise doing well    Currently in Pain?  No/denies                       [facility] Adult PT Treatment/Exercise - 05/04/19 0001      Lumbar Exercises: Aerobic   Nustep  L6 x 7 min LE only       Lumbar Exercises: Standing   Wall Slides  10 reps    Wall Slides Limitations  2 sets    Other Standing Lumbar Exercises  blue band rows/ shoulder ext/ cross body puls x 15 reps  also did 5 reps of chopping RT/LT    Other Standing Lumbar Exercises  x 15 reps march/ abduction/extension hip  5# each leg and knee flexion      Lumbar Exercises: Seated   Long Arc Quad on Chair  Right;Left    LAQ on Chair Weights (lbs)  5    LAQ on Chair  Limitations  25 reps    Sit to Stand  10 reps    Sit to Stand Limitations  2 sets from chair with 2 pillows added    Other Seated Lumbar Exercises  clams x25 blue band      Lumbar Exercises: Supine   Bridge Limitations  25 reps    Other Supine Lumbar Exercises  blue band clams  x 25                PT Short Term Goals - 04/27/19 1534      PT SHORT TERM GOAL #1   Title  He will be indpendent with initiAL Hep    Status  Achieved      PT SHORT TERM GOAL #2   Title  He will report 10% decr in RT hip/back pain    Status  Achieved        PT Long Term Goals - 05/04/19 1709      PT LONG TERM GOAL #1   Title  He will be indpendent with all HEP issued    Baseline  Indepndent with initial hEP and later HEP    Status  On-going      PT LONG TERM GOAL #2   Title  He will report pain decreased 50% or more in RT hip and back and be intermittant    Baseline  intermittant    Status  Partially Met      PT LONG TERM GOAL #3   Title  He will report able to lye on rT side for short periods due to decrased pain    Baseline  unable    Status  On-going      PT LONG TERM GOAL #4   Title  He will report greater time sitting to 30 minor more with support  before pain starts    Baseline  60 min    Status  Achieved      PT LONG TERM GOAL #5   Title  He will be able to walk without SPC in comunity    Status  Achieved      PT LONG TERM GOAL #6   Title  He will report getting to standing from sitting is 50% or more easier    Status  Achieved            Plan - 05/04/19 1617    Clinical Impression Statement  Asked him to stop sink squats completely.     no pain today so no need for heat. Added band pulls without pain.    PT Treatment/Interventions  Dry needling;Passive range of motion;Manual techniques;Patient/family education;Therapeutic activities;Gait training;Moist Heat;Iontophoresis 96m/ml Dexamethasone;Electrical Stimulation;Therapeutic exercise    PT Next Visit Plan   Continue LE/core strengthening.  hip ROM /mobs   stop sink squat at home  REview band exercises    PT Home Exercise Plan  Knee to chest, LTR,  piriformis, PPT, bent knee raise, seated H/s stretch, hooklying isometric hip flexion unilateral and opposites, sit-stand (Elevated), side stepping with blue band. , marching, standing,  blue band shoulder extension/rows,  cross body pulls and chopping.    Consulted and Agree with Plan of Care  Patient       Patient will benefit from skilled therapeutic intervention in order to improve the following deficits and impairments:  Pain, Postural dysfunction, Increased muscle spasms, Decreased activity tolerance, Decreased range of motion, Decreased strength, Difficulty walking  Visit Diagnosis: Joint stiffness of spine  Abnormal posture  Muscle spasm of back  Pain in right hip     Problem List Patient Active Problem List   Diagnosis Date Noted  . Lumbar radiculopathy 12/04/2018  . Glaucoma 12/04/2018  . Cortical age-related cataract of both eyes 12/04/2018  . Pain management contract agreement 12/04/2018  . Lumbar herniated disc 06/04/2018  . Prediabetes 01/01/2018  . Hyperlipidemia 12/30/2017  . Obesity (BMI 30.0-34.9) 12/30/2017  . Benign neoplasm of descending colon   . Essential hypertension 11/03/2015  . Cardiomyopathy, ischemic 06/03/2015  . Chronic systolic dysfunction of left ventricle 06/03/2015  . Erectile dysfunction due to arterial insufficiency 12/23/2014  . Lipoma of skin and subcutaneous tissue of neck 05/24/2014  . History of cardioembolic stroke 069/67/8938 . Vision loss of right eye 06/23/2013  . Dyslipidemia 06/22/2013  . 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([place]   . Coronary artery disease   .  Cardiomyopathy ([place])   . Pseudoaneurysm of left ventricle of heart   . Tobacco abuse     Darrel Hoover  PT         t1/18/2021, 5:16 PM  [facility] [address] [place], [place],  [address] Phone: [phone]   Fax:  [phone]  Name: [name] MRN: [id number] Date of Birth: Jun 13, 1960

## 2019-05-05 ENCOUNTER — Ambulatory Visit: Payer: Medicare HMO

## 2019-05-05 DIAGNOSIS — M62830 Muscle spasm of back: Secondary | ICD-10-CM

## 2019-05-05 DIAGNOSIS — M25551 Pain in right hip: Secondary | ICD-10-CM

## 2019-05-05 DIAGNOSIS — M2560 Stiffness of unspecified joint, not elsewhere classified: Secondary | ICD-10-CM

## 2019-05-05 DIAGNOSIS — M5416 Radiculopathy, lumbar region: Secondary | ICD-10-CM | POA: Diagnosis not present

## 2019-05-05 DIAGNOSIS — R293 Abnormal posture: Secondary | ICD-10-CM | POA: Diagnosis not present

## 2019-05-05 NOTE — Therapy (Signed)
[place], [place], [address] Phone: [phone]   Fax:  [phone]  Physical Therapy Treatment  Patient Details  Name: [name] MRN: [id number] Date of Birth: [birth year] Referring Provider (PT): Emelda Brothers, MD   Encounter Date: 05/05/2019  PT End of Session - 05/05/19 1615    Visit Number  15    Number of Visits  18    Date for PT Re-Evaluation  05/14/19    Authorization Type  MCR/MCD    Authorization Time Period  progress note at visit 20    PT Start Time  0332    PT Stop Time  0414    PT Time Calculation (min)  42 min    Activity Tolerance  Patient tolerated treatment well    Behavior During Therapy  [facility] for tasks assessed/performed       Past Medical History:  Diagnosis Date  . Abnormal EKG    hx of ischemia showing up on ekg's  . Acute myopericarditis    a. 03/2013: readm with hypoxia, tachycardia, elevated ESR/CRP, elevated troponin with 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and myopericarditis; treated with steroids.  . Acute respiratory failure ([place])    a. 01/2013: VDRF. b. 03/2013: hypoxia requiring supp O2 during adm, resolved by discharge.  . C. difficile colitis    a. 01/2013 during prolonged adm. b. Recurred 03/2013.  . Cardiac tamponade    a. 01/2013 s/p drain.  . Coronary artery disease    a. s/p MI in 2009 in [place] with stenting of the LCX and LAD;  b. 12/2012 NSTEMI/CAD: LM nl, LAD patent prox stent, LCX 50-70 isr (FFR 0.84), RCA dom, 132m EF 40-45%-->Med Rx. c. 01/2013: anterolateral STEMI complicated by pericardial effusion (presumed purulent pericarditis) and tamponade s/p drain, ruptured LV pseudoaneurysm s/p CorMatrix patch, CABGx1 (SVG-OM1), fever, CVA, VDRF, C diff.  . CVA (cerebral infarction)    a. 01/2013 in setting of prolonged hospitalization - residual L arm weakness.  . 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([place]    a. 03/2013: readm with hypoxia, tachycardia, elevated ESR/CRP, elevated troponin  with 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and myopericarditis; treated with steroids.  . Hyperlipidemia   . Hypokalemia   . Hyponatremia    a. During late 2014.  . Ischemic cardiomyopathy    a. Sept/Oct 2014: EF ~40% (ICM). b. 03/2013: EF 25-30%. Off ACEI due to hypotension (MIXED NICM/ICM).  .[place]ocardial infarction ([place]  . Pericardial effusion    a. 01/2013: pericardial effusion (presumed purulent pericarditis) and cardiac tamponade s/p drain. b. Persistent moderate pericardial effusion 03/2013.  . Pre-diabetes   . Pseudoaneurysm of left ventricle of heart    a. 01/2013:  Ruptured inferoposterior LV pseudoaneurysm s/p CorMatrix patch.  . Sinus bradycardia    asymptomatic  . Stroke ([facility] 2009   weak lt side-lt arm  . Tobacco abuse    a. quit 12/2012.  . Wears dentures    top    Past Surgical History:  Procedure Laterality Date  . CARDIAC CATHETERIZATION  01/06/2013  . COLONOSCOPY WITH PROPOFOL N/A 07/03/2016   Procedure: COLONOSCOPY WITH PROPOFOL;  Surgeon: SManus Gunning MD;  [facility];  Service: Gastroenterology;  Laterality: N/A;  . CORONARY ANGIOPLASTY WITH STENT PLACEMENT  2000's X 2   "1 + 1" (01/06/2013)  . CORONARY ARTERY BYPASS GRAFT N/A 01/28/2013   Procedure: CORONARY ARTERY BYPASS GRAFTING (CABG);  Surgeon: SMelrose Nakayama MD;  [place]  Service: Open Heart Surgery;  Laterality: N/A;  CABG x  one, using left greater saphenous vein harvested endoscopically  . INTRAOPERATIVE TRANSESOPHAGEAL ECHOCARDIOGRAM N/A 01/28/2013   Procedure: INTRAOPERATIVE TRANSESOPHAGEAL ECHOCARDIOGRAM;  Surgeon: Melrose Nakayama, MD;  Location: [place];  Service: Open Heart Surgery;  Laterality: N/A;  . LEFT HEART CATHETERIZATION WITH CORONARY ANGIOGRAM N/A 01/06/2013   Procedure: LEFT HEART CATHETERIZATION WITH CORONARY ANGIOGRAM;  Surgeon: Peter M Martinique, MD;  Location: [facility] CATH LAB;  Service: Cardiovascular;  Laterality: N/A;  . LEFT HEART CATHETERIZATION WITH CORONARY  ANGIOGRAM N/A 01/21/2013   Procedure: LEFT HEART CATHETERIZATION WITH CORONARY ANGIOGRAM;  Surgeon: Burnell Blanks, MD;  Location: [facility] CATH LAB;  Service: Cardiovascular;  Laterality: N/A;  . LUMBAR LAMINECTOMY/ DECOMPRESSION WITH MET-RX Right 06/30/2018   Procedure: Right minimally invasive Lumbar Three-Four Far lateral discectomy;  Surgeon: Judith Part, MD;  Location: [place];  Service: Neurosurgery;  Laterality: Right;  Right minimally invasive Lumbar Three-Four Far lateral discectomy  . MASS EXCISION Right 07/21/2014   Procedure: EXCISION RIGHT NECK MASS;  Surgeon: Erroll Luna, MD;  Location: [place];  Service: General;  Laterality: Right;  . PERICARDIAL TAP N/A 01/24/2013   Procedure: PERICARDIAL TAP;  Surgeon: Blane Ohara, MD;  Location: [facility] CATH LAB;  Service: Cardiovascular;  Laterality: N/A;  . RIGHT HEART CATHETERIZATION Right 01/24/2013   Procedure: RIGHT HEART CATH;  Surgeon: Blane Ohara, MD;  Location: [facility] CATH LAB;  Service: Cardiovascular;  Laterality: Right;  . VENTRICULAR ANEURYSM RESECTION N/A 01/28/2013   Procedure: LEFT VENTRICULAR ANEURYSM REPAIR;  Surgeon: Melrose Nakayama, MD;  Location: [place];  Service: Open Heart Surgery;  Laterality: N/A;    There were no vitals filed for this visit.  Subjective Assessment - 05/05/19 1539    Subjective  Sore in chest and arms from band pulls.    Pain Score  2     Pain Location  Chest    Pain Orientation  Left;Right    Pain Descriptors / Indicators  Aching                       [facility] Adult PT Treatment/Exercise - 05/05/19 0001      Lumbar Exercises: Stretches   Single Knee to Chest Stretch  Right;Left;1 rep;30 seconds    Lower Trunk Rotation Limitations  15 reps      Lumbar Exercises: Aerobic   Nustep  L6 x 7 min LE only       Lumbar Exercises: Seated   Long Arc Quad on Chair  Right;Left    LAQ on Chair Weights (lbs)  8      Lumbar Exercises: Supine   Pelvic Tilt   10 reps    Bent Knee Raise  15 reps    Bent Knee Raise Limitations  with ab set    Bridge Limitations  25 reps    Other Supine Lumbar Exercises  ball squeze x 20    Other Supine Lumbar Exercises  blue band clams  x 25       Lumbar Exercises: Sidelying   Hip Abduction  Right;Left;15 reps    Hip Abduction Limitations  blue bands      Lumbar Exercises: Prone   Straight Leg Raise  15 reps    Straight Leg Raises Limitations  RT/LT  blue band    Other Prone Lumbar Exercises  Knee flexion  5# x 15 reps RT/LT                PT Short Term Goals -  04/27/19 1534      PT SHORT TERM GOAL #1   Title  He will be indpendent with initiAL Hep    Status  Achieved      PT SHORT TERM GOAL #2   Title  He will report 10% decr in RT hip/back pain    Status  Achieved        PT Long Term Goals - 05/04/19 1709      PT LONG TERM GOAL #1   Title  He will be indpendent with all HEP issued    Baseline  Indepndent with initial hEP and later HEP    Status  On-going      PT LONG TERM GOAL #2   Title  He will report pain decreased 50% or more in RT hip and back and be intermittant    Baseline  intermittant    Status  Partially Met      PT LONG TERM GOAL #3   Title  He will report able to lye on rT side for short periods due to decrased pain    Baseline  unable    Status  On-going      PT LONG TERM GOAL #4   Title  He will report greater time sitting to 30 minor more with support  before pain starts    Baseline  60 min    Status  Achieved      PT LONG TERM GOAL #5   Title  He will be able to walk without SPC in comunity    Status  Achieved      PT LONG TERM GOAL #6   Title  He will report getting to standing from sitting is 50% or more easier    Status  Achieved            Plan - 05/05/19 1616    Clinical Impression Statement  No increased soreness as we avoided arms most of session. He felt he had a good workout with bands on mat.  Ready for discharge next visit    PT  Treatment/Interventions  Dry needling;Passive range of motion;Manual techniques;Patient/family education;Therapeutic activities;Gait training;Moist Heat;Iontophoresis 58m/ml Dexamethasone;Electrical Stimulation;Therapeutic exercise    PT Next Visit Plan  Review standing exercis. Discharge    PT Home Exercise Plan  Knee to chest, LTR,  piriformis, PPT, bent knee raise, seated H/s stretch, hooklying isometric hip flexion unilateral and opposites, sit-stand (Elevated), side stepping with blue band. , marching, standing,  blue band shoulder extension/rows,  cross body pulls and chopping.    Consulted and Agree with Plan of Care  Patient       Patient will benefit from skilled therapeutic intervention in order to improve the following deficits and impairments:  Pain, Postural dysfunction, Increased muscle spasms, Decreased activity tolerance, Decreased range of motion, Decreased strength, Difficulty walking  Visit Diagnosis: Joint stiffness of spine  Muscle spasm of back  Pain in right hip     Problem List Patient Active Problem List   Diagnosis Date Noted  . Lumbar radiculopathy 12/04/2018  . Glaucoma 12/04/2018  . Cortical age-related cataract of both eyes 12/04/2018  . Pain management contract agreement 12/04/2018  . Lumbar herniated disc 06/04/2018  . Prediabetes 01/01/2018  . Hyperlipidemia 12/30/2017  . Obesity (BMI 30.0-34.9) 12/30/2017  . Benign neoplasm of descending colon   . Essential hypertension 11/03/2015  . Cardiomyopathy, ischemic 06/03/2015  . Chronic systolic dysfunction of left ventricle 06/03/2015  . Erectile dysfunction due to arterial insufficiency 12/23/2014  .  Lipoma of skin and subcutaneous tissue of neck 05/24/2014  . History of cardioembolic stroke 96/75/9163  . Vision loss of right eye 06/23/2013  . Dyslipidemia 06/22/2013  . 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([place])   . Coronary artery disease   . Cardiomyopathy ([place])   . Pseudoaneurysm of left ventricle of heart   .  Tobacco abuse     Darrel Hoover  PT 05/05/2019, 4:18 PM  [facility] [address] [place], [place], [address] Phone: [phone]   Fax:  [phone]  Name: [name] MRN: [id number] Date of Birth: Sep 17, 1960

## 2019-05-07 ENCOUNTER — Ambulatory Visit (INDEPENDENT_AMBULATORY_CARE_PROVIDER_SITE_OTHER): Payer: Medicare HMO | Admitting: Cardiology

## 2019-05-07 ENCOUNTER — Other Ambulatory Visit: Payer: Self-pay

## 2019-05-07 ENCOUNTER — Encounter: Payer: Self-pay | Admitting: Cardiology

## 2019-05-07 VITALS — BP 121/80 | HR 58 | Temp 96.8°F | Ht 72.0 in | Wt 246.8 lb

## 2019-05-07 DIAGNOSIS — E785 Hyperlipidemia, unspecified: Secondary | ICD-10-CM

## 2019-05-07 DIAGNOSIS — Z720 Tobacco use: Secondary | ICD-10-CM

## 2019-05-07 DIAGNOSIS — I25810 Atherosclerosis of coronary artery bypass graft(s) without angina pectoris: Secondary | ICD-10-CM | POA: Diagnosis not present

## 2019-05-07 DIAGNOSIS — I255 Ischemic cardiomyopathy: Secondary | ICD-10-CM | POA: Diagnosis not present

## 2019-05-07 DIAGNOSIS — I5022 Chronic systolic (congestive) heart failure: Secondary | ICD-10-CM | POA: Diagnosis not present

## 2019-05-07 DIAGNOSIS — I10 Essential (primary) hypertension: Secondary | ICD-10-CM | POA: Diagnosis not present

## 2019-05-07 DIAGNOSIS — I519 Heart disease, unspecified: Secondary | ICD-10-CM | POA: Diagnosis not present

## 2019-05-07 NOTE — Patient Instructions (Signed)
Medication Instructions:  Continue same medications *If you need a refill on your cardiac medications before your next appointment, please call your pharmacy*  Lab Work: Bmet today   Testing/Procedures: Schedule Echo in March  Follow-Up: At [facility], you and your health needs are our priority.  As part of our continuing mission to provide you with exceptional heart care, we have created designated Provider Care Teams.  These Care Teams include your primary Cardiologist (physician) and Advanced Practice Providers (APPs -  Physician Assistants and Nurse Practitioners) who all work together to provide you with the care you need, when you need it.  Your next appointment:  6 months   Call in April to schedule July appointment   The format for your next appointment: Office    Provider:  Dr.Jordan

## 2019-05-08 LAB — BASIC METABOLIC PANEL
BUN/Creatinine Ratio: 12 (ref 9–20)
BUN: 11 mg/dL (ref 6–24)
CO2: 23 mmol/L (ref 20–29)
Calcium: 9.6 mg/dL (ref 8.7–10.2)
Chloride: 105 mmol/L (ref 96–106)
Creatinine, Ser: 0.93 mg/dL (ref 0.76–1.27)
GFR calc Af Amer: 105 mL/min/{1.73_m2} (ref >59)
GFR calc non Af Amer: 91 mL/min/{1.73_m2} (ref >59)
Glucose: 93 mg/dL (ref 65–99)
Potassium: 4.5 mmol/L (ref 3.5–5.2)
Sodium: 141 mmol/L (ref 134–144)

## 2019-05-11 ENCOUNTER — Ambulatory Visit: Payer: Medicare HMO

## 2019-05-13 ENCOUNTER — Telehealth: Payer: Self-pay

## 2019-05-13 NOTE — Telephone Encounter (Signed)
As per Mickel Baas Marsh/ [employer] [place], they had left messages for patient but have not heard back from him regarding his questions about disability

## 2019-05-18 ENCOUNTER — Other Ambulatory Visit: Payer: Self-pay

## 2019-05-18 ENCOUNTER — Ambulatory Visit: Payer: Medicare HMO | Attending: Neurological Surgery

## 2019-05-18 DIAGNOSIS — R293 Abnormal posture: Secondary | ICD-10-CM

## 2019-05-18 DIAGNOSIS — M62830 Muscle spasm of back: Secondary | ICD-10-CM | POA: Diagnosis not present

## 2019-05-18 DIAGNOSIS — M25551 Pain in right hip: Secondary | ICD-10-CM

## 2019-05-18 DIAGNOSIS — M2560 Stiffness of unspecified joint, not elsewhere classified: Secondary | ICD-10-CM | POA: Insufficient documentation

## 2019-05-18 NOTE — Therapy (Signed)
[facility] [place], [place], [address] Phone: [phone]   Fax:  [phone]  Physical Therapy Treatment/Discharge  Patient Details  Name: [name] MRN: [id number] Date of Birth: 11/20/1960 Referring Provider (PT): Emelda Brothers, MD   Encounter Date: 05/18/2019  PT End of Session - 05/18/19 1738    Visit Number  16    Authorization Type  MCR/MCD    PT Start Time  0330    PT Stop Time  1700    PT Time Calculation (min)  45 min    Activity Tolerance  Patient tolerated treatment well    Behavior During Therapy  [facility] for tasks assessed/performed       Past Medical History:  Diagnosis Date  . Abnormal EKG    hx of ischemia showing up on ekg's  . Acute myopericarditis    a. 03/2013: readm with hypoxia, tachycardia, elevated ESR/CRP, elevated troponin with 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and myopericarditis; treated with steroids.  . Acute respiratory failure ([place])    a. 01/2013: VDRF. b. 03/2013: hypoxia requiring supp O2 during adm, resolved by discharge.  . C. difficile colitis    a. 01/2013 during prolonged adm. b. Recurred 03/2013.  . Cardiac tamponade    a. 01/2013 s/p drain.  . Coronary artery disease    a. s/p MI in 2009 in [place] with stenting of the LCX and LAD;  b. 12/2012 NSTEMI/CAD: LM nl, LAD patent prox stent, LCX 50-70 isr (FFR 0.84), RCA dom, 15m EF 40-45%-->Med Rx. c. 01/2013: anterolateral STEMI complicated by pericardial effusion (presumed purulent pericarditis) and tamponade s/p drain, ruptured LV pseudoaneurysm s/p CorMatrix patch, CABGx1 (SVG-OM1), fever, CVA, VDRF, C diff.  . CVA (cerebral infarction)    a. 01/2013 in setting of prolonged hospitalization - residual L arm weakness.  . 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([place]    a. 03/2013: readm with hypoxia, tachycardia, elevated ESR/CRP, elevated troponin with 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and myopericarditis; treated with steroids.  . Hyperlipidemia   . Hypokalemia   .  Hyponatremia    a. During late 2014.  . Ischemic cardiomyopathy    a. Sept/Oct 2014: EF ~40% (ICM). b. 03/2013: EF 25-30%. Off ACEI due to hypotension (MIXED NICM/ICM).  .[place]ocardial infarction ([place] 2009  . Pericardial effusion    a. 01/2013: pericardial effusion (presumed purulent pericarditis) and cardiac tamponade s/p drain. b. Persistent moderate pericardial effusion 03/2013.  . Pre-diabetes   . Pseudoaneurysm of left ventricle of heart    a. 01/2013:  Ruptured inferoposterior LV pseudoaneurysm s/p CorMatrix patch.  . Sinus bradycardia    asymptomatic  . Stroke ([facility] 2009   weak lt side-lt arm  . Tobacco abuse    a. quit 12/2012.  . Wears dentures    top    Past Surgical History:  Procedure Laterality Date  . CARDIAC CATHETERIZATION  01/06/2013  . COLONOSCOPY WITH PROPOFOL N/A 07/03/2016   Procedure: COLONOSCOPY WITH PROPOFOL;  Surgeon: SManus Gunning MD;  Location: [facility];  Service: Gastroenterology;  Laterality: N/A;  . CORONARY ANGIOPLASTY WITH STENT PLACEMENT  2000's X 2   "1 + 1" (01/06/2013)  . CORONARY ARTERY BYPASS GRAFT N/A 01/28/2013   Procedure: CORONARY ARTERY BYPASS GRAFTING (CABG);  Surgeon: SMelrose Nakayama MD;  [facility]  Service: Open Heart Surgery;  Laterality: N/A;  CABG x one, using left greater saphenous vein harvested endoscopically  . INTRAOPERATIVE TRANSESOPHAGEAL ECHOCARDIOGRAM N/A 01/28/2013   Procedure: INTRAOPERATIVE TRANSESOPHAGEAL ECHOCARDIOGRAM;  Surgeon: SMelrose Nakayama MD;  Location:  [facility];  Service: Open Heart Surgery;  Laterality: N/A;  . LEFT HEART CATHETERIZATION WITH CORONARY ANGIOGRAM N/A 01/06/2013   Procedure: LEFT HEART CATHETERIZATION WITH CORONARY ANGIOGRAM;  Surgeon: Peter M Martinique, MD;  Location: [facility] CATH LAB;  Service: Cardiovascular;  Laterality: N/A;  . LEFT HEART CATHETERIZATION WITH CORONARY ANGIOGRAM N/A 01/21/2013   Procedure: LEFT HEART CATHETERIZATION WITH CORONARY ANGIOGRAM;  Surgeon: Burnell Blanks, MD;  Location: [facility] C[facility];  Service: Cardiovascular;  Laterality: N/A;  . LUMBAR LAMINECTOMY/ DECOMPRESSION WITH MET-RX Right 06/30/2018   Procedure: Right minimally invasive Lumbar Three-Four Far lateral discectomy;  Surgeon: Judith Part, MD;  Location: [facility];  Service: Neurosurgery;  Laterality: Right;  Right minimally invasive Lumbar Three-Four Far lateral discectomy  . MASS EXCISION Right 07/21/2014   Procedure: EXCISION RIGHT NECK MASS;  Surgeon: Erroll Luna, MD;  Location: [place];  Service: General;  Laterality: Right;  . PERICARDIAL TAP N/A 01/24/2013   Procedure: PERICARDIAL TAP;  Surgeon: Blane Ohara, MD;  Location: [facility] CATH LAB;  Service: Cardiovascular;  Laterality: N/A;  . RIGHT HEART CATHETERIZATION Right 01/24/2013   Procedure: RIGHT HEART CATH;  Surgeon: Blane Ohara, MD;  Location: [facility] CATH LAB;  Service: Cardiovascular;  Laterality: Right;  . VENTRICULAR ANEURYSM RESECTION N/A 01/28/2013   Procedure: LEFT VENTRICULAR ANEURYSM REPAIR;  Surgeon: Melrose Nakayama, MD;  Location: [place];  Service: Open Heart Surgery;  Laterality: N/A;    There were no vitals filed for this visit.  Subjective Assessment - 05/18/19 1558    Subjective  heat helps when back is sore. Better after stopping squats.    Currently in Pain?  No/denies                There ex :  nustep x 7 min LE abd UE  L5   Standing black band  Hip ext/flexion /abduction x 15 .   Seated LAQ  20 reps  ,   Seated hip INT Rotation all x 15 rpes.        [facility] Adult PT Treatment/Exercise - 05/18/19 0001      Lumbar Exercises: Standing   Other Standing Lumbar Exercises  black  band rows/ shoulder ext/ cross body puls x 15 reps  also did 5 reps of chopping RT/LT      Lumbar Exercises: Seated   Other Seated Lumbar Exercises  clams x25 black band      Lumbar Exercises: Supine   Pelvic Tilt  10 reps    Bridge Limitations  25 reps               PT  Short Term Goals - 04/27/19 1534      PT SHORT TERM GOAL #1   Title  He will be indpendent with initiAL Hep    Status  Achieved      PT SHORT TERM GOAL #2   Title  He will report 10% decr in RT hip/back pain    Status  Achieved        PT Long Term Goals - 05/18/19 1741      PT LONG TERM GOAL #1   Title  He will be indpendent with all HEP issued    Status  Achieved      PT LONG TERM GOAL #2   Title  He will report pain decreased 50% or more in RT hip and back and be intermittant    Status  Achieved      PT  LONG TERM GOAL #3   Title  He will report able to lye on rT side for short periods due to decrased pain    Baseline  able but limited    Status  Achieved      PT LONG TERM GOAL #4   Title  He will report greater time sitting to 30 minor more with support  before pain starts    Status  Achieved      PT LONG TERM GOAL #5   Title  He will be able to walk without SPC in comunity    Status  Achieved      PT LONG TERM GOAL #6   Title  He will report getting to standing from sitting is 50% or more easier    Status  Achieved            Plan - 05/18/19 1542    Clinical Impression Statement  He reports being pleased with progress. He will have an assessment in future of back and hips looking for any OA .  He still has some pain that he is able to address with heat.  He is doing all the HEP correctly and he has a full complement of bands up to black.  he stopped sink squats and pain in back has significantly receded.  He should do well in future if he maintains his HEP.    PT Treatment/Interventions  Dry needling;Passive range of motion;Manual techniques;Patient/family education;Therapeutic activities;Gait training;Moist Heat;Iontophoresis 60m/ml Dexamethasone;Electrical Stimulation;Therapeutic exercise    PT Next Visit Plan  . Discharge with HEP    PT Home Exercise Plan  Knee to chest, LTR,  piriformis, PPT, bent knee raise, seated H/s stretch, hooklying isometric hip  flexion unilateral and opposites, sit-stand (Elevated), side stepping with blue band. , marching, standing,  blue band shoulder extension/rows,  cross body pulls and chopping.    Consulted and Agree with Plan of Care  Patient       Patient will benefit from skilled therapeutic intervention in order to improve the following deficits and impairments:  Pain, Postural dysfunction, Increased muscle spasms, Decreased activity tolerance, Decreased range of motion, Decreased strength, Difficulty walking  Visit Diagnosis: Muscle spasm of back  Pain in right hip  Abnormal posture  Joint stiffness of spine     Problem List Patient Active Problem List   Diagnosis Date Noted  . Lumbar radiculopathy 12/04/2018  . Glaucoma 12/04/2018  . Cortical age-related cataract of both eyes 12/04/2018  . Pain management contract agreement 12/04/2018  . Lumbar herniated disc 06/04/2018  . Prediabetes 01/01/2018  . Hyperlipidemia 12/30/2017  . Obesity (BMI 30.0-34.9) 12/30/2017  . Benign neoplasm of descending colon   . Essential hypertension 11/03/2015  . Cardiomyopathy, ischemic 06/03/2015  . Chronic systolic dysfunction of left ventricle 06/03/2015  . Erectile dysfunction due to arterial insufficiency 12/23/2014  . Lipoma of skin and subcutaneous tissue of neck 05/24/2014  . History of cardioembolic stroke 023/76/2831 . Vision loss of right eye 06/23/2013  . Dyslipidemia 06/22/2013  . 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([place]   . Coronary artery disease   . Cardiomyopathy ([place]   . Pseudoaneurysm of left ventricle of heart   . Tobacco abuse     CDarrel Hoover PT 05/18/2019, 5:42 PM  [facility]GCentral City [place] [address]Phone: [phone]  Fax:  [phone] Name: Robert HASHEMIMRN: [id number] of Birth: 4Jul 24, 1962 PHYSICAL THERAPY DISCHARGE SUMMARY  Visits from  Start of Care: 16  Current functional level related to goals /  functional outcomes: See above   Remaining deficits: Still with pain but significantly improved from eval and has extensive HEp he is doing without increased pain   Education / Equipment: HEP Plan: Patient agrees to discharge.  Patient goals were met. Patient is being discharged due to meeting the stated rehab goals.  ?????

## 2019-05-23 ENCOUNTER — Other Ambulatory Visit: Payer: Self-pay | Admitting: Internal Medicine

## 2019-05-23 DIAGNOSIS — H409 Unspecified glaucoma: Secondary | ICD-10-CM

## 2019-05-27 ENCOUNTER — Other Ambulatory Visit: Payer: Self-pay | Admitting: Cardiology

## 2019-05-27 DIAGNOSIS — I10 Essential (primary) hypertension: Secondary | ICD-10-CM

## 2019-05-29 ENCOUNTER — Telehealth: Payer: Self-pay | Admitting: Cardiology

## 2019-05-29 NOTE — Telephone Encounter (Signed)
Returned call to patient-he wanted to confirm he is suppose to be on both carvedilol and Entresto.  Advised per chart review he has been on both.  Advised to continue.   Patient aware and verbalized understanding.

## 2019-05-29 NOTE — Telephone Encounter (Signed)
Pt c/o medication issue:  1. Name of Medication: carvedilol (COREG) 25 MG tablet  2. How are you currently taking this medication (dosage and times per day)? 1 tablet two times daily  3. Are you having a reaction (difficulty breathing--STAT)? No   4. What is your medication issue? Patient is calling wanting to clarify it is okay for him to be taking Coreg along with all his other medications.

## 2019-06-02 ENCOUNTER — Telehealth: Payer: Self-pay | Admitting: Internal Medicine

## 2019-06-02 NOTE — Telephone Encounter (Signed)
Patient called and requested to speak with pcp nurse. Patient stated it was very important. Please fu at your earliest convenience.

## 2019-06-04 NOTE — Telephone Encounter (Signed)
Returned pt call. Pt states he will be bringing his scat application to the office for completion. Pt states he is also needing a letter stating his medical conditions. Please f/u

## 2019-06-08 ENCOUNTER — Encounter: Payer: Self-pay | Admitting: Cardiology

## 2019-06-08 NOTE — Telephone Encounter (Signed)
error 

## 2019-06-12 ENCOUNTER — Telehealth: Payer: Self-pay | Admitting: Internal Medicine

## 2019-06-12 DIAGNOSIS — H401132 Primary open-angle glaucoma, bilateral, moderate stage: Secondary | ICD-10-CM | POA: Diagnosis not present

## 2019-06-12 NOTE — Telephone Encounter (Signed)
Patient came in to drop off SCAT application to be filled out. Application will put put in PCP box.

## 2019-06-15 NOTE — Telephone Encounter (Signed)
Pt called to request his eye problems be stated in the Scat application he dropped off. Aware paperwork takes 7-10 business days. Please call when completed

## 2019-06-16 NOTE — Telephone Encounter (Signed)
Will forward to jasmine

## 2019-06-17 NOTE — Telephone Encounter (Signed)
Dr. Cyndee Brightly has the form and she is filling it out.

## 2019-06-18 ENCOUNTER — Other Ambulatory Visit: Payer: Self-pay

## 2019-06-18 ENCOUNTER — Encounter: Payer: Self-pay | Admitting: Internal Medicine

## 2019-06-18 ENCOUNTER — Ambulatory Visit: Payer: Medicare HMO | Attending: Internal Medicine | Admitting: Internal Medicine

## 2019-06-18 VITALS — BP 141/82 | HR 62 | Temp 97.3°F | Resp 16 | Wt 250.8 lb

## 2019-06-18 DIAGNOSIS — F17200 Nicotine dependence, unspecified, uncomplicated: Secondary | ICD-10-CM | POA: Diagnosis not present

## 2019-06-18 DIAGNOSIS — I10 Essential (primary) hypertension: Secondary | ICD-10-CM

## 2019-06-18 DIAGNOSIS — I25810 Atherosclerosis of coronary artery bypass graft(s) without angina pectoris: Secondary | ICD-10-CM

## 2019-06-18 DIAGNOSIS — M5416 Radiculopathy, lumbar region: Secondary | ICD-10-CM | POA: Diagnosis not present

## 2019-06-18 MED ORDER — NICOTINE 14 MG/24HR TD PT24: 14.0000 mg | MEDICATED_PATCH | Freq: Every day | TRANSDERMAL | 0 refills | Status: DC

## 2019-06-18 MED ORDER — NICOTINE 21 MG/24HR TD PT24: 21.0000 mg | MEDICATED_PATCH | Freq: Every day | TRANSDERMAL | 1 refills | Status: DC

## 2019-06-18 NOTE — Progress Notes (Signed)
Patient ID: [name], male    DOB: 1960-08-08  MRN: [id number]  CC: Hypertension   Subjective: [name] is a 59 y.o. 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   Lumbar radiculopathy: He has completed physical therapy.  He found it helpful.  However he states that he is still having pain in his lower back with numbness and pain in the right leg.  He had surgery for lumbar radiculopathy 07/2018 by Dr. Venetia Constable.  Pain persisted post surgery.  He has not had any falls.  He ambulates with a cane.  Most days he rates his pain a 6 out of 10.  It is worse with prolonged standing and walking.  He would like to see a different neurosurgeon to get a second opinion.  Last MRI in the system was the beginning of last year prior to his surgery.    HYPERTENSION/CAD Currently taking: see medication list Med Adherence: [x]  Yes    []  No Medication side effects: []  Yes    [x]  No Adherence with salt restriction: [x]  Yes    []  No Home Monitoring?: [x]  Yes    []  No Monitoring Frequency: daily Home BP results range: 120-137/70-80 SOB? []  Yes    [x]  No Chest Pain?: []  Yes    [x]  No Leg swelling?: []  Yes    [x]  No Headaches?: []  Yes    [x]  No Dizziness? []  Yes    [x]  No Comments:  Has appt with cardiologist later this mth  Tob dep:  Was 2 pks/day.  Down to less than 1 pk a day.  Trying to quit.  Did patches in past and found it helpful.  He would like to try them again. [name]   HM:  COVID-19 vaccine Moderna 05/29/2019, 2nd  Will be 06/26/2019  Patient Active Problem List   Diagnosis Date Noted  . Lumbar radiculopathy 12/04/2018  . Glaucoma 12/04/2018  . Cortical age-related cataract of both eyes 12/04/2018  . Pain management contract agreement 12/04/2018  . Lumbar herniated disc 06/04/2018  . Prediabetes  01/01/2018  . Hyperlipidemia 12/30/2017  . Obesity (BMI 30.0-34.9) 12/30/2017  . Benign neoplasm of descending colon   . Essential hypertension 11/03/2015  . Cardiomyopathy, ischemic 06/03/2015  . Chronic systolic dysfunction of left ventricle 06/03/2015  . Erectile dysfunction due to arterial insufficiency 12/23/2014  . Lipoma of skin and subcutaneous tissue of neck 05/24/2014  . History of cardioembolic stroke 02/40/9735  . Vision loss of right eye 06/23/2013  . Dyslipidemia 06/22/2013  . 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([place])   . Coronary artery disease   . Cardiomyopathy ([place])   . Pseudoaneurysm of left ventricle of heart   . Tobacco abuse      Current Outpatient Medications on File Prior to Visit  Medication Sig Dispense Refill  . ALPHAGAN P 0.1 % SOLN INSTILL 1 DROP INTO BOTH EYES TWICE A DAY 10 mL 0  . aspirin 81 MG EC tablet Take 1 tablet (81 mg total) by mouth daily. Restart on 07/05/2018 90 tablet 3  . atorvastatin (LIPITOR) 80 MG tablet TAKE 1 TABLET BY MOUTH EVERY DAY 90 tablet 1  . carvedilol (COREG) 25 MG tablet Take 1 tablet (25 mg total) by mouth 2 (two) times daily with a meal. 180 tablet 3  . Cholecalciferol (VITAMIN D) 2000 units tablet Take 2,000 Units by  mouth daily.    [name] latanoprost (XALATAN) 0.005 % ophthalmic solution Place 1 drop into both eyes daily.  12  . Multiple Vitamin (MULTIVITAMIN WITH MINERALS) TABS tablet Take 1 tablet by mouth daily.    . nitroGLYCERIN (NITROSTAT) 0.4 MG SL tablet Place 1 tablet (0.4 mg total) under the tongue every 5 (five) minutes as needed for chest pain (up to 3 doses). 25 tablet 0  . sacubitril-valsartan (ENTRESTO) 97-103 MG Take 1 tablet by mouth 2 (two) times daily. 60 tablet 4  . spironolactone (ALDACTONE) 25 MG tablet Take 1 tablet (25 mg total) by mouth daily. 90 tablet 3   No current facility-administered medications on file prior to visit.    No Known Allergies  Social History   Socioeconomic History  . Marital status: Single     Spouse name: Not on file  . Number of children: 2  . Years of education: 12th  . Highest education level: Not on file  Occupational History  . Occupation: N/A  Tobacco Use  . Smoking status: Current Every Day Smoker    Packs/day: 1.50    Years: 35.00    Pack years: 52.50    Types: Cigarettes  . Smokeless tobacco: Never Used  Substance and Sexual Activity  . Alcohol use: No    Alcohol/week: 0.0 standard drinks  . Drug use: No  . Sexual activity: Yes  Other Topics Concern  . Not on file  Social History Narrative   Lives in [place].  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  Quit smoking after MI 12/2012.   Caffeine Use: very rarely   Social Determinants of Radio broadcast assistant Strain:   . Difficulty of Paying Living Expenses: Not on file  Food Insecurity:   . Worried About Charity fundraiser in the Last Year: Not on file  . Ran Out of Food in the Last Year: Not on file  Transportation Needs:   . Lack of Transportation (Medical): Not on file  . Lack of Transportation (Non-Medical): Not on file  Physical Activity:   . Days of Exercise per Week: Not on file  . Minutes of Exercise per Session: Not on file  Stress:   . Feeling of Stress : Not on file  Social Connections:   . Frequency of Communication with Friends and Family: Not on file  . Frequency of Social Gatherings with Friends and Family: Not on file  . Attends Religious Services: Not on file  . Active Member of Clubs or Organizations: Not on file  . Attends Archivist Meetings: Not on file  . Marital Status: Not on file  Intimate Partner Violence:   . Fear of Current or Ex-Partner: Not on file  . Emotionally Abused: Not on file  . Physically Abused: Not on file  . Sexually Abused: Not on file    Family History  Problem Relation Age of Onset  . Heart attack Mother   . Diabetes Mother   . Hypertension Mother   . Hypertension Father   . CAD Other   . HIV Brother   . Stroke Neg Hx       Past Surgical History:  Procedure Laterality Date  . CARDIAC CATHETERIZATION  01/06/2013  . COLONOSCOPY WITH PROPOFOL N/A 07/03/2016   Procedure: COLONOSCOPY WITH PROPOFOL;  Surgeon: Manus Gunning, MD;  Location: [facility];  Service: Gastroenterology;  Laterality: N/A;  . CORONARY ANGIOPLASTY WITH STENT PLACEMENT  2000's X 2   "1 +  1" (01/06/2013)  . CORONARY ARTERY BYPASS GRAFT N/A 01/28/2013   Procedure: CORONARY ARTERY BYPASS GRAFTING (CABG);  Surgeon: Melrose Nakayama, MD;  Location: [place];  Service: Open Heart Surgery;  Laterality: N/A;  CABG x one, using left greater saphenous vein harvested endoscopically  . INTRAOPERATIVE TRANSESOPHAGEAL ECHOCARDIOGRAM N/A 01/28/2013   Procedure: INTRAOPERATIVE TRANSESOPHAGEAL ECHOCARDIOGRAM;  Surgeon: Melrose Nakayama, MD;  Location: [place];  Service: Open Heart Surgery;  Laterality: N/A;  . LEFT HEART CATHETERIZATION WITH CORONARY ANGIOGRAM N/A 01/06/2013   Procedure: LEFT HEART CATHETERIZATION WITH CORONARY ANGIOGRAM;  Surgeon: Peter M Martinique, MD;  Location[facility] CATH LAB;  Service: Cardiovascular;  Laterality: N/A;  . LEFT HEART CATHETERIZATION WITH CORONARY ANGIOGRAM N/A 01/21/2013   Procedure: LEFT HEART CATHETERIZATION WITH CORONARY ANGIOGRAM;  Surgeon: Burnell Blanks, MD;  Location: [facility] CATH LAB;  Service: Cardiovascular;  Laterality: N/A;  . LUMBAR LAMINECTOMY/ DECOMPRESSION WITH MET-RX Right 06/30/2018   Procedure: Right minimally invasive Lumbar Three-Four Far lateral discectomy;  Surgeon: Judith Part, MD;  Location: [place];  Service: Neurosurgery;  Laterality: Right;  Right minimally invasive Lumbar Three-Four Far lateral discectomy  . MASS EXCISION Right 07/21/2014   Procedure: EXCISION RIGHT NECK MASS;  Surgeon: Erroll Luna, MD;  Location: [place];  Service: General;  Laterality: Right;  . PERICARDIAL TAP N/A 01/24/2013   Procedure: PERICARDIAL TAP;  Surgeon: Blane Ohara, MD;  Location:  [facility] CATH LAB;  Service: Cardiovascular;  Laterality: N/A;  . RIGHT HEART CATHETERIZATION Right 01/24/2013   Procedure: RIGHT HEART CATH;  Surgeon: Blane Ohara, MD;  Location: [facility] CATH LAB;  Service: Cardiovascular;  Laterality: Right;  . VENTRICULAR ANEURYSM RESECTION N/A 01/28/2013   Procedure: LEFT VENTRICULAR ANEURYSM REPAIR;  Surgeon: Melrose Nakayama, MD;  Location: [place];  Service: Open Heart Surgery;  Laterality: N/A;    ROS: Review of Systems Negative except as stated above  PHYSICAL EXAM: BP (!) 141/82   Pulse 62   Temp (!) 97.3 F (36.3 C)   Resp 16   Wt 250 lb 12.8 oz (113.8 kg)   SpO2 95%   BMI 34.01 kg/m   BP 130/84 Physical Exam General appearance - alert, well appearing, middle-aged older African-American male and in no distress Mental status - normal mood, behavior, speech, dress, motor activity, and thought processes Neck - supple, no significant adenopathy Chest - clear to auscultation, no wheezes, rales or rhonchi, symmetric air entry Heart - normal rate, regular rhythm, normal S1, S2, no murmurs, rubs, clicks or gallops Neurological -patient ambulates with a cane.  Gait appears stable.  He is able to transfer onto the exam table independently.  Power in lower extremities 4/5 right proximally and distally ; 5/5 left proximally and distally.  He has mild decreased sensation to gross and light touch on the right lower leg from the knee down.  Musculoskeletal -mild tenderness on palpation over the lumbar spine. Extremities -no lower extremity edema  CMP Latest Ref Rng & Units 05/07/2019 03/25/2019 02/12/2019  Glucose 65 - 99 mg/dL 93 100(H) 79  BUN 6 - 24 mg/dL 11 8 14   Creatinine 0.76 - 1.27 mg/dL 0.93 0.90 0.98  Sodium 134 - 144 mmol/L 141 143 140  Potassium 3.5 - 5.2 mmol/L 4.5 4.2 4.0  Chloride 96 - 106 mmol/L 105 106 106  CO2 20 - 29 mmol/L 23 24 21   Calcium 8.7 - 10.2 mg/dL 9.6 9.5 9.3  Total Protein 6.0 - 8.5 g/dL - - 6.7  Total Bilirubin 0.0 - 1.2  mg/dL - - 0.4  Alkaline Phos 39 - 117 IU/L - - 155(H)  AST 0 - 40 IU/L - - 18  ALT 0 - 44 IU/L - - 21   Lipid Panel     Component Value Date/Time   CHOL 134 02/12/2019 1436   TRIG 100 02/12/2019 1436   HDL 53 02/12/2019 1436   CHOLHDL 2.5 02/12/2019 1436   CHOLHDL 2.5 11/03/2015 1609   VLDL 19 11/03/2015 1609   LDLCALC 62 02/12/2019 1436    CBC    Component Value Date/Time   WBC 5.3 06/30/2018 1139   RBC 5.04 06/30/2018 1139   HGB 14.6 06/30/2018 1139   HGB 14.8 12/30/2017 1450   HCT 44.3 06/30/2018 1139   HCT 43.4 12/30/2017 1450   PLT 142 (L) 06/30/2018 1139   PLT 154 12/30/2017 1450   MCV 87.9 06/30/2018 1139   MCV 85 12/30/2017 1450   MCH 29.0 06/30/2018 1139   MCHC 33.0 06/30/2018 1139   RDW 13.1 06/30/2018 1139   RDW 12.8 12/30/2017 1450   LYMPHSABS 1.9 03/29/2018 1333   MONOABS 0.7 03/29/2018 1333   EOSABS 0.1 03/29/2018 1333   BASOSABS 0.0 03/29/2018 1333    ASSESSMENT AND PLAN: 1. Lumbar radiculopathy -Patient with persistent pain post lumbar discectomy.  Patient requesting referral to a different neurosurgeon for second opinion.  We will get an updated MRI of the lumbar spine and then submit the referral. - MR Lumbar Spine Wo Contrast; Future  2. Coronary artery disease involving coronary bypass graft of native heart without angina pectoris Clinically stable.  Continue current medications including carvedilol, Entresto, atorvastatin, aspirin  3. Essential hypertension Repeat blood pressure close to goal.  Continue current medications  4. Tobacco dependence Advised to quit.  Patient is working on trying to quit.  He would like to try the nicotine patches again using the stepdown approach.  He will start with the 21 mg patches and then decrease to the 14. - nicotine (NICODERM CQ - DOSED IN MG/24 HOURS) 21 mg/24hr patch; Place 1 patch (21 mg total) onto the skin daily.  Dispense: 30 patch; Refill: 1 - nicotine (NICODERM CQ - DOSED IN MG/24 HOURS) 14  mg/24hr patch; Place 1 patch (14 mg total) onto the skin daily.  Dispense: 30 patch; Refill: 0    Patient was given the opportunity to ask questions.  Patient verbalized understanding of the plan and was able to repeat key elements of the plan.   Orders Placed This Encounter  Procedures  . MR Lumbar Spine Wo Contrast     Requested Prescriptions   Signed Prescriptions Disp Refills  . nicotine (NICODERM CQ - DOSED IN MG/24 HOURS) 21 mg/24hr patch 30 patch 1    Sig: Place 1 patch (21 mg total) onto the skin daily.  . nicotine (NICODERM CQ - DOSED IN MG/24 HOURS) 14 mg/24hr patch 30 patch 0    Sig: Place 1 patch (14 mg total) onto the skin daily.    Return in about 4 months (around 10/18/2019).  Karle Plumber, MD, FACP

## 2019-06-22 ENCOUNTER — Telehealth: Payer: Self-pay

## 2019-06-22 ENCOUNTER — Telehealth: Payer: Self-pay | Admitting: Internal Medicine

## 2019-06-22 NOTE — Telephone Encounter (Signed)
Patient called saying his MRI appt was scheduled Saturday, 07/04/19. Patient states that his transportation does not work over the weekends and needs the appt rescheduled during the week. Patient tried to call and reschedule his appt but was told that his PCP had to reschedule. Lease f/u

## 2019-06-22 NOTE — Telephone Encounter (Signed)
[employer] to start prior auth for MRI   CPT- [phone] MRI spine lumbar wo contrast  ICD- M54.16 Lumbar radiculopathy   Authorization- [id number] Effective- 06/22/2019 Expires- 07/22/2019   Contacted the scheduling department and to spoke to [facility]  MRI is schedule for July 04, 2019 At 8:30am. Patient will need to arrive at 8am through the ED. Pt doesn't have any restrictions.  Contacted pt and made aware of appointment information

## 2019-06-22 NOTE — Telephone Encounter (Signed)
Returned pt call and contacted the scheduling department to reschedule MRI. Pt is srescheduled for MRI on 3/30

## 2019-06-29 ENCOUNTER — Other Ambulatory Visit: Payer: Self-pay

## 2019-06-29 ENCOUNTER — Ambulatory Visit (HOSPITAL_COMMUNITY): Payer: Medicare HMO | Attending: Internal Medicine

## 2019-06-29 DIAGNOSIS — I519 Heart disease, unspecified: Secondary | ICD-10-CM | POA: Diagnosis not present

## 2019-06-29 DIAGNOSIS — I255 Ischemic cardiomyopathy: Secondary | ICD-10-CM | POA: Diagnosis not present

## 2019-06-29 DIAGNOSIS — I10 Essential (primary) hypertension: Secondary | ICD-10-CM

## 2019-06-30 ENCOUNTER — Telehealth: Payer: Self-pay

## 2019-06-30 DIAGNOSIS — I255 Ischemic cardiomyopathy: Secondary | ICD-10-CM

## 2019-06-30 DIAGNOSIS — I519 Heart disease, unspecified: Secondary | ICD-10-CM

## 2019-06-30 NOTE — Telephone Encounter (Signed)
Spoke to patient echo results given.Dr.Jordan advised to see EP consideration for ICD.Appointment scheduled with Dr.Allred 07/09/19 at 12:00 noon.

## 2019-07-04 ENCOUNTER — Other Ambulatory Visit (HOSPITAL_COMMUNITY): Payer: Medicare HMO

## 2019-07-07 ENCOUNTER — Other Ambulatory Visit: Payer: Self-pay

## 2019-07-07 ENCOUNTER — Ambulatory Visit: Payer: Medicare HMO | Attending: Internal Medicine | Admitting: Internal Medicine

## 2019-07-07 ENCOUNTER — Ambulatory Visit: Payer: Medicare HMO | Admitting: Internal Medicine

## 2019-07-07 DIAGNOSIS — H401132 Primary open-angle glaucoma, bilateral, moderate stage: Secondary | ICD-10-CM

## 2019-07-07 DIAGNOSIS — M5416 Radiculopathy, lumbar region: Secondary | ICD-10-CM | POA: Diagnosis not present

## 2019-07-07 DIAGNOSIS — I255 Ischemic cardiomyopathy: Secondary | ICD-10-CM | POA: Diagnosis not present

## 2019-07-07 NOTE — Progress Notes (Signed)
Virtual Visit via Telephone Note Due to current restrictions/limitations of in-office visits due to the COVID-19 pandemic, this scheduled clinical appointment was converted to a telehealth visit  I connected with [name] on 07/07/19 at 9:29 a.m by telephone and verified that I am speaking with the correct person using two identifiers. I am in my office.  The patient is at home.  Only the patient and myself participated in this encounter.  I discussed the limitations, risks, security and privacy concerns of performing an evaluation and management service by telephone and the availability of in person appointments. I also discussed with the patient that there may be a patient responsible charge related to this service. The patient expressed understanding and agreed to proceed.   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  Patient last seen 06/18/2019.  Purpose of today's visit is to request PCS services.  Reports things started going down hill after he saw me earlier this month. He reported increased fatigue and shortness of breath.  He saw his cardiologist recently and had a repeat echo.  This revealed that his EF is still at 20-25% unchanged from prior despite optimizing medical therapy.  His cardiologist Dr. Martinique has recommended placement of an ICD.  He has an appointment coming up this week for EP consultation.  He also reports having seen Dr. Gershon Crane his eye doctor last month.  He has moderate glaucoma.  Due to poor eyesight, cardiomyopathy and chronic back pain issues, he is having increasing difficulty in performing some of his ADLs including light housekeeping, getting in and out of the tub when he take showers, meal preps and grocery shopping.  He has a friend who has been helping him out.  He has called several home health agencies and the one he  would prefer to go with this call [facility].  He was told that he would need a letter from his doctor stating that he is in need of home health services.  Patient has Medicare and Medicaid.  Outpatient Encounter Medications as of 07/07/2019  Medication Sig Note  . ALPHAGAN P 0.1 % SOLN INSTILL 1 DROP INTO BOTH EYES TWICE A DAY   . aspirin 81 MG EC tablet Take 1 tablet (81 mg total) by mouth daily. Restart on 07/05/2018   . atorvastatin (LIPITOR) 80 MG tablet TAKE 1 TABLET BY MOUTH EVERY DAY   . carvedilol (COREG) 25 MG tablet Take 1 tablet (25 mg total) by mouth 2 (two) times daily with a meal.   . Cholecalciferol (VITAMIN D) 2000 units tablet Take 2,000 Units by mouth daily.   Marland Kitchen latanoprost (XALATAN) 0.005 % ophthalmic solution Place 1 drop into both eyes daily.   . Multiple Vitamin (MULTIVITAMIN WITH MINERALS) TABS tablet Take 1 tablet by mouth daily.   . nicotine (NICODERM CQ - DOSED IN MG/24 HOURS) 14 mg/24hr patch Place 1 patch (14 mg total) onto the skin daily.   . nicotine (NICODERM CQ - DOSED IN MG/24 HOURS) 21 mg/24hr patch Place 1 patch (21 mg total) onto the skin daily.   . nitroGLYCERIN (NITROSTAT) 0.4 MG SL tablet Place 1 tablet (0.4 mg total) under the tongue every 5 (five) minutes as needed for chest pain (up to 3 doses). 06/30/2018: Havent had to use   . sacubitril-valsartan (ENTRESTO) 97-103 MG Take 1 tablet by mouth 2 (two) times daily.   Marland Kitchen spironolactone (ALDACTONE)  25 MG tablet Take 1 tablet (25 mg total) by mouth daily.    No facility-administered encounter medications on file as of 07/07/2019.      Observations/Objective:   Chemistry      Component Value Date/Time   NA 141 05/07/2019 1528   K 4.5 05/07/2019 1528   CL 105 05/07/2019 1528   CO2 23 05/07/2019 1528   BUN 11 05/07/2019 1528   CREATININE 0.93 05/07/2019 1528   CREATININE 0.90 11/03/2015 1609      Component Value Date/Time   CALCIUM 9.6 05/07/2019 1528   ALKPHOS 155 (H) 02/12/2019 1448   AST 18  02/12/2019 1448   ALT 21 02/12/2019 1448   BILITOT 0.4 02/12/2019 1448       Assessment and Plan: 1. Ischemic cardiomyopathy 2. Chronic open angle glaucoma of both eyes, moderate stage 3. Lumbar radiculopathy -I think this pt has made a good case for PCS services.  He fatigues easily due to his cardiomyopathy.  He is unable to stand for long periods to do meal preps or significant housework due to his lumbar radiculopathy.  Message sent to our caseworker to help him get PCS services.   Follow Up Instructions: As previously scheduled.   I discussed the assessment and treatment plan with the patient. The patient was provided an opportunity to ask questions and all were answered. The patient agreed with the plan and demonstrated an understanding of the instructions.   The patient was advised to call back or seek an in-person evaluation if the symptoms worsen or if the condition fails to improve as anticipated.  I provided  12 minutes of non-face-to-face time during this encounter.   Karle Plumber, MD

## 2019-07-07 NOTE — Progress Notes (Signed)
Pt states he has seen the cardiologist since he since [place]  Pt states he has to get a defibrillator  Pt states he has been tired, not eating like he suppose to. Pt states his eyesight is getting worse

## 2019-07-09 ENCOUNTER — Ambulatory Visit (INDEPENDENT_AMBULATORY_CARE_PROVIDER_SITE_OTHER): Payer: Medicare HMO | Admitting: Internal Medicine

## 2019-07-09 ENCOUNTER — Encounter: Payer: Self-pay | Admitting: Internal Medicine

## 2019-07-09 ENCOUNTER — Other Ambulatory Visit: Payer: Self-pay

## 2019-07-09 ENCOUNTER — Telehealth: Payer: Self-pay

## 2019-07-09 VITALS — BP 110/72 | HR 66 | Ht 73.0 in | Wt 254.0 lb

## 2019-07-09 DIAGNOSIS — I255 Ischemic cardiomyopathy: Secondary | ICD-10-CM

## 2019-07-09 DIAGNOSIS — Z951 Presence of aortocoronary bypass graft: Secondary | ICD-10-CM

## 2019-07-09 DIAGNOSIS — I25810 Atherosclerosis of coronary artery bypass graft(s) without angina pectoris: Secondary | ICD-10-CM | POA: Diagnosis not present

## 2019-07-09 NOTE — Telephone Encounter (Signed)
PCS application faxed to [facility] 

## 2019-07-09 NOTE — Patient Instructions (Addendum)
Medication Instructions:  Your physician recommends that you continue on your current medications as directed. Please refer to the Current Medication list given to you today.  Labwork: None ordered.  Testing/Procedures: Your physician has recommended that you have a defibrillator inserted. An implantable cardioverter defibrillator (ICD) is a small device that is placed in your chest or, in rare cases, your abdomen. This device uses electrical pulses or shocks to help control life-threatening, irregular heartbeats that could lead the heart to suddenly stop beating (sudden cardiac arrest). Leads are attached to the ICD that goes into your heart. This is done in the hospital and usually requires an overnight stay. Please see the instruction sheet given to you today for more information.   Follow-Up:  If you decide to go ahead with ICD implant please let me know. [place]  Any Other Special Instructions Will Be Listed Below (If Applicable).  If you need a refill on your cardiac medications before your next appointment, please call your pharmacy.    Cardioverter Defibrillator Implantation  An implantable cardioverter defibrillator (ICD) is a small device that is placed under the skin in the chest or abdomen. An ICD consists of a battery, a small computer (pulse generator), and wires (leads) that go into the heart. An ICD is used to detect and correct two types of dangerous irregular heartbeats (arrhythmias):  A rapid heart rhythm (tachycardia).  An arrhythmia in which the lower chambers of the heart (ventricles) contract in an uncoordinated way (fibrillation). When an ICD detects tachycardia, it sends a low-energy shock to the heart to restore the heartbeat to normal (cardioversion). This signal is usually painless. If cardioversion does not work or if the ICD detects fibrillation, it delivers a high-energy shock to the heart (defibrillation) to restart the heart. This shock  may feel like a strong jolt in the chest. Your health care provider may prescribe an ICD if:  You have had an arrhythmia that originated in the ventricles.  Your heart has been damaged by a disease or heart condition. Sometimes, ICDs are programmed to act as a device called a pacemaker. Pacemakers can be used to treat a slow heartbeat (bradycardia) or tachycardia by taking over the heart rate with electrical impulses. Tell a health care provider about:  Any allergies you have.  All medicines you are taking, including vitamins, herbs, eye drops, creams, and over-the-counter medicines.  Any problems you or family members have had with anesthetic medicines.  Any blood disorders you have.  Any surgeries you have had.  Any medical conditions you have.  Whether you are pregnant or may be pregnant. What are the risks? Generally, this is a safe procedure. However, problems may occur, including:  Swelling, bleeding, or bruising.  Infection.  Blood clots.  Damage to other structures or organs, such as nerves, blood vessels, or the heart.  Allergic reactions to medicines used during the procedure. What happens before the procedure? Staying hydrated Follow instructions from your health care provider about hydration, which may include:  Up to 2 hours before the procedure - you may continue to drink clear liquids, such as water, clear fruit juice, black coffee, and plain tea. Eating and drinking restrictions Follow instructions from your health care provider about eating and drinking, which may include:  8 hours before the procedure - stop eating heavy meals or foods such as meat, fried foods, or fatty foods.  6 hours before the procedure - stop eating light meals or foods, such as toast or  cereal.  6 hours before the procedure - stop drinking milk or drinks that contain milk.  2 hours before the procedure - stop drinking clear liquids. Medicine Ask your health care provider  about:  Changing or stopping your normal medicines. This is important if you take diabetes medicines or blood thinners.  Taking medicines such as aspirin and ibuprofen. These medicines can thin your blood. Do not take these medicines before your procedure if your doctor tells you not to. Tests  You may have blood tests.  You may have a test to check the electrical signals in your heart (electrocardiogram, ECG).  You may have imaging tests, such as a chest X-ray. General instructions  For 24 hours before the procedure, stop using products that contain nicotine or tobacco, such as cigarettes and e-cigarettes. If you need help quitting, ask your health care provider.  Plan to have someone take you home from the hospital or clinic.  You may be asked to shower with a germ-killing soap. What happens during the procedure?  To reduce your risk of infection: ? Your health care team will wash or sanitize their hands. ? Your skin will be washed with soap. ? Hair may be removed from the surgical area.  Small monitors will be put on your body. They will be used to check your heart, blood pressure, and oxygen level.  An IV tube will be inserted into one of your veins.  You will be given one or more of the following: ? A medicine to help you relax (sedative). ? A medicine to numb the area (local anesthetic). ? A medicine to make you fall asleep (general anesthetic).  Leads will be guided through a blood vessel into your heart and attached to your heart muscles. Depending on the ICD, the leads may go into one ventricle or they may go into both ventricles and into an upper chamber of the heart. An X-ray machine (fluoroscope) will be usedto help guide the leads.  A small incision will be made to create a deep pocket under your skin.  The pulse generator will be placed into the pocket.  The ICD will be tested.  The incision will be closed with stitches (sutures), skin glue, or staples.  A  bandage (dressing) will be placed over the incision. This procedure may vary among health care providers and hospitals. What happens after the procedure?  Your blood pressure, heart rate, breathing rate, and blood oxygen level will be monitored often until the medicines you were given have worn off.  A chest X-ray will be taken to check that the ICD is in the right place.  You will need to stay in the hospital for 1-2 days so your health care provider can make sure your ICD is working.  Do not drive for 24 hours if you received a sedative. Ask your health care provider when it is safe for you to drive.  You may be given an identification card explaining that you have an ICD. Summary  An implantable cardioverter defibrillator (ICD) is a small device that is placed under the skin in the chest or abdomen. It is used to detect and correct dangerous irregular heartbeats (arrhythmias).  An ICD consists of a battery, a small computer (pulse generator), and wires (leads) that go into the heart.  When an ICD detects rapid heart rhythm (tachycardia), it sends a low-energy shock to the heart to restore the heartbeat to normal (cardioversion). If cardioversion does not work or if the ICD  detects uncoordinated heart contractions (fibrillation), it delivers a high-energy shock to the heart (defibrillation) to restart the heart.  You will need to stay in the hospital for 1-2 days to make sure your ICD is working. This information is not intended to replace advice given to you by your health care provider. Make sure you discuss any questions you have with your health care provider. Document Revised: 03/15/2017 Document Reviewed: 04/11/2016 Elsevier Patient Education  2020 [employer].

## 2019-07-09 NOTE — Progress Notes (Signed)
Electrophysiology Office Note   Date:  07/09/2019   ID:  NAOKI Tucker, DOB 1960-05-19, MRN [id number]  PCP:  Ladell Pier, MD  Cardiologist:  Dr Martinique Primary Electrophysiologist: Thompson Grayer, MD    CC: CHF   History of Present Illness: [name] is a 59 y.o. male who presents today for electrophysiology evaluation.   He is referred by Dr Martinique for EP consultation regarding risk stratification of sudden death.  He has an ischemic CM (EF 20%) with NYHA Class III CHF.  He had MI in 2009 and underwent PCI.  He had recurrent MI in 2014.  This was complicated by ventricular wall rupture and tamponade.  He underwent urgent surgical repair and CABG by Dr Roxan Hockey.  He has done reasonably well since that time. He continues to smoke. He snores but has not had a sleep study. He reports SOB with moderate activity. Today, he denies symptoms of palpitations, chest pain,  orthopnea, PND, lower extremity edema, claudication, dizziness, presyncope, syncope, bleeding, or neurologic sequela. The patient is tolerating medications without difficulties and is otherwise without complaint today.    Past Medical History:  Diagnosis Date  . Abnormal EKG    hx of ischemia showing up on ekg's  . Acute myopericarditis    a. 03/2013: readm with hypoxia, tachycardia, elevated ESR/CRP, elevated troponin with 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and myopericarditis; treated with steroids.  . Acute respiratory failure ([place])    a. 01/2013: VDRF. b. 03/2013: hypoxia requiring supp O2 during adm, resolved by discharge.  . C. difficile colitis    a. 01/2013 during prolonged adm. b. Recurred 03/2013.  . Cardiac tamponade    a. 01/2013 s/p drain.  . Cataracts, bilateral   . Coronary artery disease    a. s/p MI in 2009 in [place] with stenting of the LCX and LAD;  b. 12/2012 NSTEMI/CAD: LM nl, LAD patent prox stent, LCX 50-70 isr (FFR 0.84), RCA dom, 156m EF 40-45%-->Med Rx. c. 01/2013: anterolateral STEMI complicated  by pericardial effusion (presumed purulent pericarditis) and tamponade s/p drain, ruptured LV pseudoaneurysm s/p CorMatrix patch, CABGx1 (SVG-OM1), fever, CVA, VDRF, C diff.  . CVA (cerebral infarction)    a. 01/2013 in setting of prolonged hospitalization - residual L arm weakness.  . 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    a. 03/2013: readm with hypoxia, tachycardia, elevated ESR/CRP, elevated troponin with 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and myopericarditis; treated with steroids.  . Glaucoma   . Hyperlipidemia   . Hypokalemia   . Hyponatremia    a. During late 2014.  . Ischemic cardiomyopathy    a. Sept/Oct 2014: EF ~40% (ICM). b. 03/2013: EF 25-30%. Off ACEI due to hypotension (MIXED NICM/ICM).  .Marland KitchenOptic neuropathy, ischemic   . Pericardial effusion    a. 01/2013: pericardial effusion (presumed purulent pericarditis) and cardiac tamponade s/p drain. b. Persistent moderate pericardial effusion 03/2013.  . Pre-diabetes   . Pseudoaneurysm of left ventricle of heart    a. 01/2013:  Ruptured inferoposterior LV pseudoaneurysm s/p CorMatrix patch.  . Sinus bradycardia    asymptomatic  . Stroke ([facility] 2009   weak lt side-lt arm  . Tobacco abuse   . Wears dentures    top   Past Surgical History:  Procedure Laterality Date  . CARDIAC CATHETERIZATION  01/06/2013  . COLONOSCOPY WITH PROPOFOL N/A 07/03/2016   Procedure: COLONOSCOPY WITH PROPOFOL;  Surgeon: SManus Gunning MD;  Location: [facility];  Service: Gastroenterology;  Laterality: N/A;  . CORONARY ANGIOPLASTY WITH STENT PLACEMENT  2000's  X 2   "1 + 1" (01/06/2013)  . CORONARY ARTERY BYPASS GRAFT N/A 01/28/2013   Procedure: CORONARY ARTERY BYPASS GRAFTING (CABG);  Surgeon: Melrose Nakayama, MD;  Location: [place];  Service: Open Heart Surgery;  Laterality: N/A;  CABG x one, using left greater saphenous vein harvested endoscopically  . INTRAOPERATIVE TRANSESOPHAGEAL ECHOCARDIOGRAM N/A 01/28/2013   Procedure: INTRAOPERATIVE TRANSESOPHAGEAL  ECHOCARDIOGRAM;  Surgeon: Melrose Nakayama, MD;  Location: [place];  Service: Open Heart Surgery;  Laterality: N/A;  . LEFT HEART CATHETERIZATION WITH CORONARY ANGIOGRAM N/A 01/06/2013   Procedure: LEFT HEART CATHETERIZATION WITH CORONARY ANGIOGRAM;  Surgeon: Peter M Martinique, MD;  Location: [facility] CATH LAB;  Service: Cardiovascular;  Laterality: N/A;  . LEFT HEART CATHETERIZATION WITH CORONARY ANGIOGRAM N/A 01/21/2013   Procedure: LEFT HEART CATHETERIZATION WITH CORONARY ANGIOGRAM;  Surgeon: Burnell Blanks, MD;  [facility] C[facility] LAB;  Service: Cardiovascular;  Laterality: N/A;  . LUMBAR LAMINECTOMY/ DECOMPRESSION WITH MET-RX Right 06/30/2018   Procedure: Right minimally invasive Lumbar Three-Four Far lateral discectomy;  Surgeon: Judith Part, MD;  Location: [place];  Service: Neurosurgery;  Laterality: Right;  Right minimally invasive Lumbar Three-Four Far lateral discectomy  . MASS EXCISION Right 07/21/2014   Procedure: EXCISION RIGHT NECK MASS;  Surgeon: Erroll Luna, MD;  Location: [place];  Service: General;  Laterality: Right;  . PERICARDIAL TAP N/A 01/24/2013   Procedure: PERICARDIAL TAP;  Surgeon: Blane Ohara, MD;  Location: [facility] CATH LAB;  Service: Cardiovascular;  Laterality: N/A;  . RIGHT HEART CATHETERIZATION Right 01/24/2013   Procedure: RIGHT HEART CATH;  Surgeon: Blane Ohara, MD;  Location: [facility] CATH LAB;  Service: Cardiovascular;  Laterality: Right;  . VENTRICULAR ANEURYSM RESECTION N/A 01/28/2013   Procedure: LEFT VENTRICULAR ANEURYSM REPAIR;  Surgeon: Melrose Nakayama, MD;  Location: [place];  Service: Open Heart Surgery;  Laterality: N/A;     Current Outpatient Medications  Medication Sig Dispense Refill  . ALPHAGAN P 0.1 % SOLN INSTILL 1 DROP INTO BOTH EYES TWICE A DAY 10 mL 0  . aspirin 81 MG EC tablet Take 1 tablet (81 mg total) by mouth daily. Restart on 07/05/2018 90 tablet 3  . atorvastatin (LIPITOR) 80 MG tablet TAKE 1 TABLET BY MOUTH  EVERY DAY 90 tablet 1  . carvedilol (COREG) 25 MG tablet Take 1 tablet (25 mg total) by mouth 2 (two) times daily with a meal. 180 tablet 3  . Cholecalciferol (VITAMIN D) 2000 units tablet Take 2,000 Units by mouth daily.    Marland Kitchen latanoprost (XALATAN) 0.005 % ophthalmic solution Place 1 drop into both eyes daily.  12  . Multiple Vitamin (MULTIVITAMIN WITH MINERALS) TABS tablet Take 1 tablet by mouth daily.    . nicotine (NICODERM CQ - DOSED IN MG/24 HOURS) 14 mg/24hr patch Place 1 patch (14 mg total) onto the skin daily. 30 patch 0  . nicotine (NICODERM CQ - DOSED IN MG/24 HOURS) 21 mg/24hr patch Place 1 patch (21 mg total) onto the skin daily. 30 patch 1  . nitroGLYCERIN (NITROSTAT) 0.4 MG SL tablet Place 1 tablet (0.4 mg total) under the tongue every 5 (five) minutes as needed for chest pain (up to 3 doses). 25 tablet 0  . sacubitril-valsartan (ENTRESTO) 97-103 MG Take 1 tablet by mouth 2 (two) times daily. 60 tablet 4  . spironolactone (ALDACTONE) 25 MG tablet Take 1 tablet (25 mg total) by mouth daily. 90 tablet 3   No current facility-administered medications for this visit.  Allergies:   Patient has no known allergies.   Social History:  The patient  reports that he has been smoking cigarettes. He has a 52.50 pack-year smoking history. He has never used smokeless tobacco. He reports that he does not drink alcohol or use drugs.   Family History:  The patient's family history includes CAD in an other family member; Diabetes in his mother; HIV in his brother; Heart attack in his mother; Hypertension in his father and mother.    ROS:  Please see the history of present illness.   All other systems are personally reviewed and negative.    PHYSICAL EXAM: VS:  BP 110/72   Pulse 66   Ht 6' 1"  (1.854 m)   Wt 254 lb (115.2 kg)   SpO2 95%   BMI 33.51 kg/m  , BMI Body mass index is 33.51 kg/m. GEN: Well nourished, well developed, in no acute distress  HEENT: normal  Neck: no JVD  Cardiac:  RRR  Respiratory:   normal work of breathing GI: soft  MS: no deformity or atrophy  Skin: warm and dry  Neuro:  Strength and sensation are intact Psych: euthymic mood, full affect  EKG:  EKG is ordered today. The ekg ordered today is personally reviewed and shows sinus rhythm 66 bpm, PR 238 msec, QRS 90 msec, QTc 461 msec, inferior infarct pattern   Recent Labs: 02/12/2019: ALT 21; BNP 384.2; Magnesium 2.1 05/07/2019: BUN 11; Creatinine, Ser 0.93; Potassium 4.5; Sodium 141  personally reviewed   Lipid Panel     Component Value Date/Time   CHOL 134 02/12/2019 1436   TRIG 100 02/12/2019 1436   HDL 53 02/12/2019 1436   CHOLHDL 2.5 02/12/2019 1436   CHOLHDL 2.5 11/03/2015 1609   VLDL 19 11/03/2015 1609   LDLCALC 62 02/12/2019 1436   personally reviewed   Wt Readings from Last 3 Encounters:  07/09/19 254 lb (115.2 kg)  06/18/19 250 lb 12.8 oz (113.8 kg)  05/07/19 246 lb 12.8 oz (111.9 kg)      Other studies personally reviewed: Additional studies/ records that were reviewed today include: Dr Morrison Old notes, prior echo  Review of the above records today demonstrates: as above   ASSESSMENT AND PLAN:  1.  Ischemic CM/ chronic systolic dysfunction The patient has an ischemic CM (EF 20%), NYHA Class III CHF, and CAD.  He is referred by Dr Martinique for risk stratification of sudden death and consideration of ICD implantation.  At this time, he meets MADIT II/ SCD-HeFT criteria for ICD implantation for primary prevention of sudden death.  He has narrow QRS and therefore is not a candidate for CRT.  I have had a thorough discussion with the patient and hsi wife reviewing options.  The patient and wife have had opportunities to ask questions and have them answered.  Risks, benefits, alternatives to single chamber ICD implantation were discussed in detail with the patient today. The patient understands that the risks include but are not limited to bleeding, infection, pneumothorax,  perforation, tamponade, vascular damage, renal failure, MI, stroke, death, inappropriate shocks, and lead dislodgement and wishes to think about this further with his spouse.  He will contact my office if he decides to proceed.  2. Snoring I would advise a sleep study.  This can ben arranged by general cardiology    Follow-up:  With Dr Martinique as scheduled He will contact my office if he decides to proceed with ICD  Current medicines are reviewed at length with  the patient today.   The patient does not have concerns regarding his medicines.  The following changes were made today:  none  Labs/ tests ordered today include:  Orders Placed This Encounter  Procedures  . EKG 12-Lead     Signed, Thompson Grayer, MD  07/09/2019 1:07 PM     [address] [place] [id number] [phone] (office) [phone] (fax)

## 2019-07-12 ENCOUNTER — Other Ambulatory Visit: Payer: Self-pay | Admitting: Internal Medicine

## 2019-07-12 DIAGNOSIS — I25810 Atherosclerosis of coronary artery bypass graft(s) without angina pectoris: Secondary | ICD-10-CM

## 2019-07-12 DIAGNOSIS — E785 Hyperlipidemia, unspecified: Secondary | ICD-10-CM

## 2019-07-14 ENCOUNTER — Telehealth: Payer: Self-pay

## 2019-07-14 ENCOUNTER — Ambulatory Visit (HOSPITAL_COMMUNITY)
Admission: RE | Admit: 2019-07-14 | Discharge: 2019-07-14 | Disposition: A | Discharge status: Home / Self Care | Payer: Medicare HMO | Source: Ambulatory Visit | Attending: Internal Medicine | Admitting: Internal Medicine

## 2019-07-14 ENCOUNTER — Other Ambulatory Visit: Payer: Self-pay

## 2019-07-14 DIAGNOSIS — M5126 Other intervertebral disc displacement, lumbar region: Secondary | ICD-10-CM | POA: Diagnosis not present

## 2019-07-14 DIAGNOSIS — M5416 Radiculopathy, lumbar region: Secondary | ICD-10-CM | POA: Diagnosis not present

## 2019-07-14 NOTE — Telephone Encounter (Signed)
Call placed to [facility], spoke to [place] who stated that patient's PCS assessment is scheduled for 07/16/2019

## 2019-07-15 ENCOUNTER — Other Ambulatory Visit: Payer: Self-pay | Admitting: Internal Medicine

## 2019-07-15 ENCOUNTER — Encounter: Payer: Self-pay | Admitting: Internal Medicine

## 2019-07-15 ENCOUNTER — Telehealth: Payer: Self-pay

## 2019-07-15 DIAGNOSIS — M5416 Radiculopathy, lumbar region: Secondary | ICD-10-CM

## 2019-07-15 MED ORDER — GABAPENTIN 300 MG PO CAPS: 300.0000 mg | ORAL_CAPSULE | Freq: Every day | ORAL | 3 refills | Status: DC

## 2019-07-15 NOTE — Telephone Encounter (Signed)
Message received from [place] [facility] [place] noting that they have not been able to contact the patient.

## 2019-07-30 DIAGNOSIS — M47816 Spondylosis without myelopathy or radiculopathy, lumbar region: Secondary | ICD-10-CM | POA: Diagnosis not present

## 2019-07-30 DIAGNOSIS — M5136 Other intervertebral disc degeneration, lumbar region: Secondary | ICD-10-CM | POA: Diagnosis not present

## 2019-07-30 DIAGNOSIS — M5416 Radiculopathy, lumbar region: Secondary | ICD-10-CM | POA: Diagnosis not present

## 2019-07-30 DIAGNOSIS — M48062 Spinal stenosis, lumbar region with neurogenic claudication: Secondary | ICD-10-CM | POA: Diagnosis not present

## 2019-08-03 ENCOUNTER — Other Ambulatory Visit: Payer: Self-pay | Admitting: Cardiology

## 2019-08-03 NOTE — Telephone Encounter (Signed)
*  STAT* If patient is at the pharmacy, call can be transferred to refill team.   1. Which medications need to be refilled? (please list name of each medication and dose if known) sacubitril-valsartan (ENTRESTO) 97-103 MG  2. Which pharmacy/location (including street and city if local pharmacy) is medication to be sent to? [id number] - [place] - [address] AT [address]  3. Do they need a 30 day or 90 day supply?  90 day supply

## 2019-08-04 NOTE — Telephone Encounter (Signed)
Rx sent in today. 

## 2019-08-26 ENCOUNTER — Other Ambulatory Visit: Payer: Self-pay | Admitting: Internal Medicine

## 2019-08-26 DIAGNOSIS — M5416 Radiculopathy, lumbar region: Secondary | ICD-10-CM

## 2019-09-08 DIAGNOSIS — H401132 Primary open-angle glaucoma, bilateral, moderate stage: Secondary | ICD-10-CM | POA: Diagnosis not present

## 2019-09-09 ENCOUNTER — Telehealth: Payer: Self-pay

## 2019-09-09 NOTE — Telephone Encounter (Signed)
As per Mickel Baas [facility] [facility] , they have not been able to reach the patient and sent him a letter with their contact information

## 2019-09-28 DIAGNOSIS — M5136 Other intervertebral disc degeneration, lumbar region: Secondary | ICD-10-CM | POA: Diagnosis not present

## 2019-09-28 DIAGNOSIS — M5416 Radiculopathy, lumbar region: Secondary | ICD-10-CM | POA: Diagnosis not present

## 2019-09-28 DIAGNOSIS — M47816 Spondylosis without myelopathy or radiculopathy, lumbar region: Secondary | ICD-10-CM | POA: Diagnosis not present

## 2019-10-22 ENCOUNTER — Ambulatory Visit: Payer: Medicare HMO | Attending: Internal Medicine | Admitting: Family

## 2019-10-22 ENCOUNTER — Telehealth: Payer: Self-pay

## 2019-10-22 ENCOUNTER — Other Ambulatory Visit: Payer: Self-pay

## 2019-10-22 ENCOUNTER — Encounter: Payer: Self-pay | Admitting: Internal Medicine

## 2019-10-22 VITALS — BP 114/75 | HR 78 | Temp 97.0°F | Resp 16 | Wt 262.6 lb

## 2019-10-22 DIAGNOSIS — R2689 Other abnormalities of gait and mobility: Secondary | ICD-10-CM | POA: Diagnosis not present

## 2019-10-22 DIAGNOSIS — I25810 Atherosclerosis of coronary artery bypass graft(s) without angina pectoris: Secondary | ICD-10-CM

## 2019-10-22 DIAGNOSIS — M5416 Radiculopathy, lumbar region: Secondary | ICD-10-CM | POA: Diagnosis not present

## 2019-10-22 DIAGNOSIS — I10 Essential (primary) hypertension: Secondary | ICD-10-CM

## 2019-10-22 DIAGNOSIS — F17200 Nicotine dependence, unspecified, uncomplicated: Secondary | ICD-10-CM

## 2019-10-22 NOTE — Progress Notes (Signed)
Pt states he is also having right leg pain

## 2019-10-22 NOTE — Patient Instructions (Addendum)
Follow-up with primary physician in 3 months or sooner if needed. Return for labs next week. Schedule follow-up with Cardiology.  Hypertension, Adult Hypertension is another name for high blood pressure. High blood pressure forces your heart to work harder to pump blood. This can cause problems over time. There are two numbers in a blood pressure reading. There is a top number (systolic) over a bottom number (diastolic). It is best to have a blood pressure that is below 120/80. Healthy choices can help lower your blood pressure, or you may need medicine to help lower it. What are the causes? The cause of this condition is not known. Some conditions may be related to high blood pressure. What increases the risk?  Smoking.  Having type 2 diabetes mellitus, high cholesterol, or both.  Not getting enough exercise or physical activity.  Being overweight.  Having too much fat, sugar, calories, or salt (sodium) in your diet.  Drinking too much alcohol.  Having long-term (chronic) kidney disease.  Having a family history of high blood pressure.  Age. Risk increases with age.  Race. You may be at higher risk if you are African American.  Gender. Men are at higher risk than women before age 78. After age 74, women are at higher risk than men.  Having obstructive sleep apnea.  Stress. What are the signs or symptoms?  High blood pressure may not cause symptoms. Very high blood pressure (hypertensive crisis) may cause: ? Headache. ? Feelings of worry or nervousness (anxiety). ? Shortness of breath. ? Nosebleed. ? A feeling of being sick to your stomach (nausea). ? Throwing up (vomiting). ? Changes in how you see. ? Very bad chest pain. ? Seizures. How is this treated?  This condition is treated by making healthy lifestyle changes, such as: ? Eating healthy foods. ? Exercising more. ? Drinking less alcohol.  Your health care provider may prescribe medicine if lifestyle changes  are not enough to get your blood pressure under control, and if: ? Your top number is above 130. ? Your bottom number is above 80.  Your personal target blood pressure may vary. Follow these instructions at home: Eating and drinking   If told, follow the DASH eating plan. To follow this plan: ? Fill one half of your plate at each meal with fruits and vegetables. ? Fill one fourth of your plate at each meal with whole grains. Whole grains include whole-wheat pasta, brown rice, and whole-grain bread. ? Eat or drink low-fat dairy products, such as skim milk or low-fat yogurt. ? Fill one fourth of your plate at each meal with low-fat (lean) proteins. Low-fat proteins include fish, chicken without skin, eggs, beans, and tofu. ? Avoid fatty meat, cured and processed meat, or chicken with skin. ? Avoid pre-made or processed food.  Eat less than 1,500 mg of salt each day.  Do not drink alcohol if: ? Your doctor tells you not to drink. ? You are pregnant, may be pregnant, or are planning to become pregnant.  If you drink alcohol: ? Limit how much you use to:  0-1 drink a day for women.  0-2 drinks a day for men. ? Be aware of how much alcohol is in your drink. In the [place] one drink equals one 12 oz bottle of beer (355 mL), one 5 oz glass of wine (148 mL), or one 1 oz glass of hard liquor (44 mL). Lifestyle   Work with your doctor to stay at a healthy weight or to  lose weight. Ask your doctor what the best weight is for you.  Get at least 30 minutes of exercise most days of the week. This may include walking, swimming, or biking.  Get at least 30 minutes of exercise that strengthens your muscles (resistance exercise) at least 3 days a week. This may include lifting weights or doing Pilates.  Do not use any products that contain nicotine or tobacco, such as cigarettes, e-cigarettes, and chewing tobacco. If you need help quitting, ask your doctor.  Check your blood pressure at home as  told by your doctor.  Keep all follow-up visits as told by your doctor. This is important. Medicines  Take over-the-counter and prescription medicines only as told by your doctor. Follow directions carefully.  Do not skip doses of blood pressure medicine. The medicine does not work as well if you skip doses. Skipping doses also puts you at risk for problems.  Ask your doctor about side effects or reactions to medicines that you should watch for. Contact a doctor if you:  Think you are having a reaction to the medicine you are taking.  Have headaches that keep coming back (recurring).  Feel dizzy.  Have swelling in your ankles.  Have trouble with your vision. Get help right away if you:  Get a very bad headache.  Start to feel mixed up (confused).  Feel weak or numb.  Feel faint.  Have very bad pain in your: ? Chest. ? Belly (abdomen).  Throw up more than once.  Have trouble breathing. Summary  Hypertension is another name for high blood pressure.  High blood pressure forces your heart to work harder to pump blood.  For most people, a normal blood pressure is less than 120/80.  Making healthy choices can help lower blood pressure. If your blood pressure does not get lower with healthy choices, you may need to take medicine. This information is not intended to replace advice given to you by your health care provider. Make sure you discuss any questions you have with your health care provider. Document Revised: 12/11/2017 Document Reviewed: 12/11/2017 Elsevier Patient Education  2020 [employer].

## 2019-10-22 NOTE — Progress Notes (Signed)
Patient ID: [name], male    DOB: May 01, 1960  MRN: [id number]  CC: Hypertension   Subjective: [name] is a 59 y.o. male with history of coronary artery disease, cardiomyopathy, Dressler syndrome, dyslipidemia, glaucoma, and history of CABG who presents for chronic conditions follow-up.  1. HYPERTENSION FOLLOW-UP: Currently taking: see medication list Med Adherence: [x]  Yes    []  No Medication side effects: []  Yes    [x]  No Adherence with salt restriction: [x]  Yes    []  No Exercise: Yes []  No [x]  Home Monitoring?: [x]  Yes    []  No Monitoring Frequency: [x]  Yes    []  No Home BP results range: [x]  Yes, 115-118/76-77 Smoking [x]  Yes, 1 pack daily, hasnt started Nicoderm yet SOB? [x]  Yes    []  No Chest Pain?: []  Yes    [x]  No Leg swelling?: []  Yes    [x]  No Headaches?: []  Yes    [x]  No Dizziness? [x]  Yes, sometimes especially if bending over  Comments: Last visit 06/18/2019 with Dr. Wynetta Emery. During that encounter blood pressure close to goal and continued on current medications.  2. CAD FOLLOW-UP: Symptoms:  SOB and fatigue reports he gets tired quickly Med Compliance: Current Medications:   Antiplatelet agent:  []  yes [x]  no   Beta Blocker: [x]  yes []  no   Ace inhibitor:  []  yes [x]  no   Statin  [x]  yes []  no   Last visit 05/07/2019 with cardiologist Dr. Martinique. During that encounter patient discussed placement of ICD if EF remains low and despite optimal medical therapy.  Last visit 06/18/2019 with Dr. Wynetta Emery. During that encounter patient stable. Continued on current medications including Carvedilol, Entresto, Atorvastatin, and Aspirin.  Last visit 07/09/2019 with cardiologist Dr. Rayann Heman. During that encounter risks, benefits, and alternatives to single chamber ICD implantation were discussed in detail with patient. Patient instructed to contact office when he is ready to proceed.   Today patient reports he is considering scheduling an appointment for ICD soon.    3. LUMBAR RADICULOPATHY FOLLOW-UP:  Last visit 06/18/2019 with Dr. Wynetta Emery. Patient requested referral to a different neurosurgeon for a second opinion.   Last visit 07/07/2019 with Dr. Wynetta Emery. Patient referred to [facility].  Last visit 09/28/2019 with Dr. Katherine Roan at [facility]. During that encounter patient presented for second opinion evaluation to chronic persistent low back pain with associated radiating right leg pain and weakness in distribution consistent with a right L3/4 radiculopathy. MRI from February 2020 did reveal a foraminal disc extrusion with the subsequent impingement on the right L3 nerve root and as such the patient did undergo decompression surgery by another physician. However despite undergoing right L3/4 far lateral microdiscectomy, his symptoms persisted. On 09/28/2019 an in-depth conversation with the patient and his wife regarding his condition was discussed.  Most recent lumbar MRI confirmed resolution of the right foraminal disc herniation. There was no longer any nerve root impingement at the L3/4 level. Patient counseled that reexploration surgery would not be of any benefit. MRI did demonstrate chronic degenerative disc and facet arthropathy changes at L4/5 level, and these changes typically resulted in left-sided lateral recess and foraminal narrowing but the patient did not have any left leg symptoms. Patient was recommended to continue comprehensive pain management including ongoing PT, medication therapy, and injection therapy as tolerated.   Today reports he still has pain and numbness in the right thigh, knee, and ankle. Reports ambulating with a cane. Describes slight back pain with certain movements. Reports he  has been assissted by [employer].  4. BALANCE PROBLEM:  Today patient reports problem with balance. Reports he believes his right leg pain and having gained some weight over the course of the past year are contributing factors to his balance problem. Reports he has  fallen at least 3 times in the past month.  Reports falls began about 1.5 months ago. Reports he typically falls when he gets up from the kitchen table because he feels like his right leg is going to give out. Reports  that he has only fell at home and has never fell outside of the home. Reports he has a nurse aide to come to his home to assist him with activities of daily living for about 2.5 hours daily. Reports when he walks outside of the home he tries to be mindful to plan those activities while his nurse aide is present. Reports he has not received medical attention following any of the falls and has not had any significant injuries or changes in condition with these falls.   Reports he is interested in beginning exercising again. Reports he does not want to go to any gyms in the city as he is afraid of falling in the gym. Reports he called his insurance provider, [employer], and was told that he prequalifies for an in-home treadmill. Reports [employer] gave him a pre-authorization number [id number]) and that he was told to provide this to his primary physician. Patient reports he was told by [facility] that his primary physician should find a company which will honor his insurance's preapproval for in-home treadmill.    Patient Active Problem List   Diagnosis Date Noted  . Hx of CABG 07/09/2019  . Lumbar radiculopathy 12/04/2018  . Glaucoma 12/04/2018  . Cortical age-related cataract of both eyes 12/04/2018  . Pain management contract agreement 12/04/2018  . Lumbar herniated disc 06/04/2018  . Prediabetes 01/01/2018  . Hyperlipidemia 12/30/2017  . Obesity (BMI 30.0-34.9) 12/30/2017  . Benign neoplasm of descending colon   . Essential hypertension 11/03/2015  . Cardiomyopathy, ischemic 06/03/2015  . Chronic systolic dysfunction of left ventricle 06/03/2015  . Erectile dysfunction due to arterial insufficiency 12/23/2014  . Lipoma of skin and subcutaneous tissue of neck 05/24/2014  . History of  cardioembolic stroke 19/62/2297  . Vision loss of right eye 06/23/2013  . Dyslipidemia 06/22/2013  . 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([place])   . Coronary artery disease   . Cardiomyopathy ([place])   . Pseudoaneurysm of left ventricle of heart   . Tobacco abuse      Current Outpatient Medications on File Prior to Visit  Medication Sig Dispense Refill  . ALPHAGAN P 0.1 % SOLN INSTILL 1 DROP INTO BOTH EYES TWICE A DAY 10 mL 0  . aspirin 81 MG EC tablet Take 1 tablet (81 mg total) by mouth daily. Restart on 07/05/2018 90 tablet 3  . atorvastatin (LIPITOR) 80 MG tablet TAKE 1 TABLET BY MOUTH EVERY DAY 90 tablet 1  . carvedilol (COREG) 25 MG tablet Take 1 tablet (25 mg total) by mouth 2 (two) times daily with a meal. 180 tablet 3  . Cholecalciferol (VITAMIN D) 2000 units tablet Take 2,000 Units by mouth daily.    Marland Kitchen ENTRESTO 97-103 MG TAKE 1 TABLET BY MOUTH TWICE A DAY 180 tablet 2  . gabapentin (NEURONTIN) 300 MG capsule TAKE 1 CAPSULE BY MOUTH EVERYDAY AT BEDTIME 90 capsule 1  . latanoprost (XALATAN) 0.005 % ophthalmic solution Place 1 drop into both eyes daily.  12  .  Multiple Vitamin (MULTIVITAMIN WITH MINERALS) TABS tablet Take 1 tablet by mouth daily.    . nicotine (NICODERM CQ - DOSED IN MG/24 HOURS) 14 mg/24hr patch Place 1 patch (14 mg total) onto the skin daily. 30 patch 0  . nicotine (NICODERM CQ - DOSED IN MG/24 HOURS) 21 mg/24hr patch Place 1 patch (21 mg total) onto the skin daily. 30 patch 1  . nitroGLYCERIN (NITROSTAT) 0.4 MG SL tablet Place 1 tablet (0.4 mg total) under the tongue every 5 (five) minutes as needed for chest pain (up to 3 doses). 25 tablet 0  . spironolactone (ALDACTONE) 25 MG tablet Take 1 tablet (25 mg total) by mouth daily. 90 tablet 3   No current facility-administered medications on file prior to visit.    No Known Allergies  Social History   Socioeconomic History  . Marital status: Single    Spouse name: Not on file  . Number of children: 2  . Years of education: 12th   . Highest education level: Not on file  Occupational History  . Occupation: N/A  Tobacco Use  . Smoking status: Current Every Day Smoker    Packs/day: 1.50    Years: 35.00    Pack years: 52.50    Types: Cigarettes  . Smokeless tobacco: Never Used  Vaping Use  . Vaping Use: Never used  Substance and Sexual Activity  . Alcohol use: No    Alcohol/week: 0.0 standard drinks  . Drug use: No  . Sexual activity: Yes  Other Topics Concern  . Not on file  Social History Narrative   Lives in [place] with wife.  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  He is now retired on disability.  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      Caffeine Use: very rarely   Social Determinants of Radio broadcast assistant Strain:   . Difficulty of Paying Living Expenses:   Food Insecurity:   . Worried About Charity fundraiser in the Last Year:   . Arboriculturist in the Last Year:   Transportation Needs:   . Film/video editor (Medical):   Marland Kitchen Lack of Transportation (Non-Medical):   Physical Activity:   . Days of Exercise per Week:   . Minutes of Exercise per Session:   Stress:   . Feeling of Stress :   Social Connections:   . Frequency of Communication with Friends and Family:   . Frequency of Social Gatherings with Friends and Family:   . Attends Religious Services:   . Active Member of Clubs or Organizations:   . Attends Archivist Meetings:   Marland Kitchen Marital Status:   Intimate Partner Violence:   . Fear of Current or Ex-Partner:   . Emotionally Abused:   Marland Kitchen Physically Abused:   . Sexually Abused:     Family History  Problem Relation Age of Onset  . Heart attack Mother   . Diabetes Mother   . Hypertension Mother   . Hypertension Father   . CAD Other   . HIV Brother   . Stroke Neg Hx     Past Surgical History:  Procedure Laterality Date  . CARDIAC CATHETERIZATION  01/06/2013  . COLONOSCOPY WITH PROPOFOL N/A 07/03/2016   Procedure: COLONOSCOPY WITH  PROPOFOL;  Surgeon: Manus Gunning, MD;  Location: [facility];  Service: Gastroenterology;  Laterality: N/A;  . CORONARY ANGIOPLASTY WITH STENT PLACEMENT  2000's X 2   "1 +  1" (01/06/2013)  . CORONARY ARTERY BYPASS GRAFT N/A 01/28/2013   Procedure: CORONARY ARTERY BYPASS GRAFTING (CABG);  Surgeon: Melrose Nakayama, MD;  Location: [place];  Service: Open Heart Surgery;  Laterality: N/A;  CABG x one, using left greater saphenous vein harvested endoscopically  . INTRAOPERATIVE TRANSESOPHAGEAL ECHOCARDIOGRAM N/A 01/28/2013   Procedure: INTRAOPERATIVE TRANSESOPHAGEAL ECHOCARDIOGRAM;  Surgeon: Melrose Nakayama, MD;  Location: [facility];  Service: Open Heart Surgery;  Laterality: N/A;  . LEFT HEART CATHETERIZATION WITH CORONARY ANGIOGRAM N/A 01/06/2013   Procedure: LEFT HEART CATHETERIZATION WITH CORONARY ANGIOGRAM;  Surgeon: Peter M Martinique, MD;  Location: [facility] CATH LAB;  Service: Cardiovascular;  Laterality: N/A;  . LEFT HEART CATHETERIZATION WITH CORONARY ANGIOGRAM N/A 01/21/2013   Procedure: LEFT HEART CATHETERIZATION WITH CORONARY ANGIOGRAM;  Surgeon: Burnell Blanks, MD;  Location: [facility] CATH LAB;  Service: Cardiovascular;  Laterality: N/A;  . LUMBAR LAMINECTOMY/ DECOMPRESSION WITH MET-RX Right 06/30/2018   Procedure: Right minimally invasive Lumbar Three-Four Far lateral discectomy;  Surgeon: Judith Part, MD;  Location: [place];  Service: Neurosurgery;  Laterality: Right;  Right minimally invasive Lumbar Three-Four Far lateral discectomy  . MASS EXCISION Right 07/21/2014   Procedure: EXCISION RIGHT NECK MASS;  Surgeon: Erroll Luna, MD;  Location: [place];  Service: General;  Laterality: Right;  . PERICARDIAL TAP N/A 01/24/2013   Procedure: PERICARDIAL TAP;  Surgeon: Blane Ohara, MD;  Location: [facility] CATH LAB;  Service: Cardiovascular;  Laterality: N/A;  . RIGHT HEART CATHETERIZATION Right 01/24/2013   Procedure: RIGHT HEART CATH;  Surgeon: Blane Ohara, MD;   Location[facility] LAB;  Service: Cardiovascular;  Laterality: Right;  . VENTRICULAR ANEURYSM RESECTION N/A 01/28/2013   Procedure: LEFT VENTRICULAR ANEURYSM REPAIR;  Surgeon: Melrose Nakayama, MD;  Location: [place];  Service: Open Heart Surgery;  Laterality: N/A;    ROS: Review of Systems Negative except as stated above  PHYSICAL EXAM: BP 114/75   Pulse 78   Temp (!) 97 F (36.1 C)   Resp 16   Wt 262 lb 9.6 oz (119.1 kg)   SpO2 99%   BMI 34.65 kg/m   Physical Exam General appearance - alert, well appearing, and in no distress and oriented to person, place, and time Mental status - alert, oriented to person, place, and time, normal mood, behavior, speech, dress, motor activity, and thought processes Neck - supple, no significant adenopathy Lymphatics - no palpable lymphadenopathy, no hepatosplenomegaly Chest - clear to auscultation, no wheezes, rales or rhonchi, symmetric air entry, no tachypnea, retractions or cyanosis Extremities - peripheral pulses normal, no pedal edema, no clubbing or cyanosis, no pedal edema noted, no lower extremity edema  ASSESSMENT AND PLAN: 1. Essential hypertension: -Blood pressure well-controlled.  -Continue Carvedilol as prescribed. Refills available on file. -Counseled on blood pressure goal of less than 130/80, low-sodium, DASH diet, medication compliance, 150 minutes of moderate intensity exercise per week as tolerated. Discussed medication compliance, adverse effects. -BMP last obtained 05/07/2019.  -Repeat BMP scheduled for future. Patient reports he will return within the next week for this. -Follow-up with primary physician in 3 months or sooner if needed.  -Basic Metabolic Panel; Future  2. Coronary artery disease involving coronary bypass graft of native heart without angina pectoris: -Patient clinically stable.  -Continue Carvedilol, Entresto, Atorvastatin, and Aspirin as prescribed. Refills available on file for Carvedilol and  Atorvastatin. Entresto, Spironolactone, and Aspirin managed by cardiology.  -BMP last obtained 05/07/2019.  -Repeat BMP scheduled for future. Patient reports  he will return within the next week for this. -Keep scheduled appointments with cardiology.  -Follow-up with primary provider in 3 months or sooner if needed.  -Basic Metabolic Panel; Future  3. Lumbar radiculopathy: -Last visit 09/28/2019 with Dr. Katherine Roan at [facility]. During that encounter patient presented for second opinion evaluation to chronic persistent low back pain with associated radiating right leg pain and weakness in distribution consistent with a right L3/4 radiculopathy. -On 09/28/2019 Dr. Katherine Roan had an in-depth conversation with the patient and his wife regarding his condition was discussed.  Most recent lumbar MRI confirmed resolution of the right foraminal disc herniation. There was no longer any nerve root impingement at the L3/4 level. Patient counseled that reexploration surgery would not be of any benefit. MRI did demonstrate chronic degenerative disc and facet arthropathy changes at L4/5 level, and these changes typically resulted in left-sided lateral recess and foraminal narrowing but the patient did not have any left leg symptoms. Patient was recommended to continue comprehensive pain management including ongoing PT, medication therapy, and injection therapy as tolerated.   -Today reports he still has pain and numbness in the right thigh, knee, and ankle. Reports ambulating with a cane. Describes slight back pain with certain movements. -Continue Gabapentin as prescribed for lumbar radiculopathy. Refills available on file.  4. Balance problem: -Patient reports frequent falls in the home in the past month. Denies injury and seeking medical attention in relation to falls. Reports he believes his right leg pain and having gained some weight over the course of the past year are contributing factors to his balance  problem. -Reports he has a nurse aide to come to his home daily for 2.5 hours for assistance with activities of daily living. -Patient reports he would like to start exercising again and reports he has been pre-authorized for in-home treadmill from his health insurance [employer]. -Today Case Manager called patient's insurance at [facility] and spoke with a representative who was not able to see coverage for in-home treadmill or any documentation of pre-authorization, or conversation with patient. Case Manager called patient to inform him of update. Patient stated he would call [employer] back to verify his coverage and that he would call Case Manager back.  5. Tobacco dependence: -Today patient reports he is smoking at least a pack daily and hasn't started Nicoderm patches yet. -Counseled to quit.  Discussed health risk associated with smoking and cessation methods including NRT, Chantix, Buproprion.   Patient was given the opportunity to ask questions.  Patient verbalized understanding of the plan and was able to repeat key elements of the plan. Patient was given clear instructions to go to Emergency Department or return to medical center if symptoms don't improve, worsen, or new problems develop.The patient verbalized understanding.  Camillia Herter, NP

## 2019-10-22 NOTE — Telephone Encounter (Signed)
At request of Dola Factor, call placed to [facility] # [phone] to confirm patient eligibility for a treadmill and the process for ordering the machine.   He informed the provider that he was pre-qualified and to contact the number noted above.  This CM spoke to [facility] customer service at the number above. He stated that he was not able to see any coverage for a treadmill or any documentation of pre-authorization or conversation with patient.   Call placed to the patient and informed him of above.  He said that he will call [employer] back again to verify his coverage and then call this CM.

## 2019-10-26 ENCOUNTER — Other Ambulatory Visit: Payer: Self-pay | Admitting: Internal Medicine

## 2019-10-26 DIAGNOSIS — H409 Unspecified glaucoma: Secondary | ICD-10-CM

## 2019-11-16 ENCOUNTER — Other Ambulatory Visit: Payer: Self-pay

## 2019-11-16 ENCOUNTER — Ambulatory Visit: Payer: Medicare HMO | Attending: Internal Medicine

## 2019-11-16 DIAGNOSIS — I25810 Atherosclerosis of coronary artery bypass graft(s) without angina pectoris: Secondary | ICD-10-CM

## 2019-11-16 DIAGNOSIS — I10 Essential (primary) hypertension: Secondary | ICD-10-CM

## 2019-11-17 LAB — BASIC METABOLIC PANEL
BUN/Creatinine Ratio: 17 (ref 9–20)
BUN: 15 mg/dL (ref 6–24)
CO2: 22 mmol/L (ref 20–29)
Calcium: 9.4 mg/dL (ref 8.7–10.2)
Chloride: 105 mmol/L (ref 96–106)
Creatinine, Ser: 0.89 mg/dL (ref 0.76–1.27)
GFR calc Af Amer: 109 mL/min/{1.73_m2} (ref >59)
GFR calc non Af Amer: 94 mL/min/{1.73_m2} (ref >59)
Glucose: 95 mg/dL (ref 65–99)
Potassium: 4.1 mmol/L (ref 3.5–5.2)
Sodium: 139 mmol/L (ref 134–144)

## 2019-11-17 NOTE — Progress Notes (Signed)
Please call patient with update.   Kidney function normal.

## 2019-11-19 ENCOUNTER — Telehealth: Payer: Self-pay

## 2019-11-19 NOTE — Telephone Encounter (Signed)
Contacted pt to go over lab results pt is aware and doesn't have any questions or concerns 

## 2019-12-11 DIAGNOSIS — H401132 Primary open-angle glaucoma, bilateral, moderate stage: Secondary | ICD-10-CM | POA: Diagnosis not present

## 2019-12-22 NOTE — Progress Notes (Signed)
Date:  12/24/2019   ID:  [name], DOB 02-11-1961, MRN [id number]  PCP:  Ladell Pier, MD  Cardiologist:  Rosalea Withrow Martinique, MD  Electrophysiologist:  None   Evaluation Performed:  Follow-Up Visit  Chief Complaint:  CHF/CAD  History of Present Illness:    [name] is a 59 y.o. male with  PMH of hyperlipidemia,  CVA, prediabetes, CAD, ischemic cardiomyopathy, history of cardiac tamponade and  pericarditis.  He had stents to the left circumflex and LAD in 2007.  In September 2014, he presented with chest pain.  Repeat cardiac catheterization showed totally occluded RCA and a 50% in-stent restenosis of left circumflex stent.  Medical therapy was recommended.  He was admitted in October 2014 with a large pericardial effusion with tamponade due to ruptured LV pseudoaneurysm.  Pericardial drain was placed.  Prior to discharge, he developed signs of stroke with left arm weakness and left visual field deficit.  He then developed hemodynamic collapse and  required intubation.  Repeat echocardiogram showed complex pericardial effusion with right ventricular collapse and a tamponade.  He underwent emergent median sternotomy and repair of ruptured inferoseptal posterior LV pseudoaneurysm with a core matrix patch and CABG x1 with SVG to OM by Dr. Roxan Hockey.  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  His EF by echocardiogram at the time was 25% with moderate pericardial effusion.    Echocardiogram by August 2015 showed EF of 30%.  Echocardiogram in February 2017 showed EF of 30 to 35%.    He was seen by Almyra Deforest PA-C in March 2020 for pre op clearance for lumbar surgery.   he did undergo L3-4 discectomy without complication. Last repeat Echo in March showed persistently low EF 20-25%. Medical therapy optimized including Entresto, Coreg, and aldactone. He was seen by Dr Rayann Heman in  March for consideration of ICD.    Past Medical History:  Diagnosis Date  . Abnormal EKG    hx of ischemia showing up on ekg's  . Acute myopericarditis    a. 03/2013: readm with hypoxia, tachycardia, elevated ESR/CRP, elevated troponin with 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and myopericarditis; treated with steroids.  . Acute respiratory failure ([place])    a. 01/2013: VDRF. b. 03/2013: hypoxia requiring supp O2 during adm, resolved by discharge.  . C. difficile colitis    a. 01/2013 during prolonged adm. b. Recurred 03/2013.  . Cardiac tamponade    a. 01/2013 s/p drain.  . Cataracts, bilateral   . Coronary artery disease    a. s/p MI in 2009 in [place] with stenting of the LCX and LAD;  b. 12/2012 NSTEMI/CAD: LM nl, LAD patent prox stent, LCX 50-70 isr (FFR 0.84), RCA dom, 173m EF 40-45%-->Med Rx. c. 01/2013: anterolateral STEMI complicated by pericardial effusion (presumed purulent pericarditis) and tamponade s/p drain, ruptured LV pseudoaneurysm s/p CorMatrix patch, CABGx1 (SVG-OM1), fever, CVA, VDRF, C diff.  . CVA (cerebral infarction)    a. 01/2013 in setting of prolonged hospitalization - residual L arm weakness.  . 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    a. 03/2013: readm with hypoxia, tachycardia, elevated ESR/CRP, elevated troponin with 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and myopericarditis; treated with steroids.  . Glaucoma   . Hyperlipidemia   . Hypokalemia   . Hyponatremia    a. During late 2014.  . Ischemic cardiomyopathy    a. Sept/Oct 2014: EF ~40% (ICM). b. 03/2013: EF 25-30%. Off ACEI due  to hypotension (MIXED NICM/ICM).  Marland Kitchen Optic neuropathy, ischemic   . Pericardial effusion    a. 01/2013: pericardial effusion (presumed purulent pericarditis) and cardiac tamponade s/p drain. b. Persistent moderate pericardial effusion 03/2013.  . Pre-diabetes   . Pseudoaneurysm of left ventricle of heart    a. 01/2013:  Ruptured inferoposterior LV pseudoaneurysm s/p CorMatrix patch.  . Sinus bradycardia    asymptomatic  . Stroke  [facility]) 2009   weak lt side-lt arm  . Tobacco abuse   . Wears dentures    top   Past Surgical History:  Procedure Laterality Date  . CARDIAC CATHETERIZATION  01/06/2013  . COLONOSCOPY WITH PROPOFOL N/A 07/03/2016   Procedure: COLONOSCOPY WITH PROPOFOL;  Surgeon: Manus Gunning, MD;  Location: [facility];  Service: Gastroenterology;  Laterality: N/A;  . CORONARY ANGIOPLASTY WITH STENT PLACEMENT  2000's X 2   "1 + 1" (01/06/2013)  . CORONARY ARTERY BYPASS GRAFT N/A 01/28/2013   Procedure: CORONARY ARTERY BYPASS GRAFTING (CABG);  Surgeon: Melrose Nakayama, MD;  Location: [place];  Service: Open Heart Surgery;  Laterality: N/A;  CABG x one, using left greater saphenous vein harvested endoscopically  . INTRAOPERATIVE TRANSESOPHAGEAL ECHOCARDIOGRAM N/A 01/28/2013   Procedure: INTRAOPERATIVE TRANSESOPHAGEAL ECHOCARDIOGRAM;  Surgeon: Melrose Nakayama, MD;  Location: [place];  Service: Open Heart Surgery;  Laterality: N/A;  . LEFT HEART CATHETERIZATION WITH CORONARY ANGIOGRAM N/A 01/06/2013   Procedure: LEFT HEART CATHETERIZATION WITH CORONARY ANGIOGRAM;  Surgeon: Armanie Ullmer M Martinique, MD;  Location: [facility] CATH LAB;  Service: Cardiovascular;  Laterality: N/A;  . LEFT HEART CATHETERIZATION WITH CORONARY ANGIOGRAM N/A 01/21/2013   Procedure: LEFT HEART CATHETERIZATION WITH CORONARY ANGIOGRAM;  Surgeon: Burnell Blanks, MD;  Location: [facility] CATH LAB;  Service: Cardiovascular;  Laterality: N/A;  . LUMBAR LAMINECTOMY/ DECOMPRESSION WITH MET-RX Right 06/30/2018   Procedure: Right minimally invasive Lumbar Three-Four Far lateral discectomy;  Surgeon: Judith Part, MD;  Location: [place];  Service: Neurosurgery;  Laterality: Right;  Right minimally invasive Lumbar Three-Four Far lateral discectomy  . MASS EXCISION Right 07/21/2014   Procedure: EXCISION RIGHT NECK MASS;  Surgeon: Erroll Luna, MD;  Location: [place];  Service: General;  Laterality: Right;  . PERICARDIAL TAP N/A  01/24/2013   Procedure: PERICARDIAL TAP;  Surgeon: Blane Ohara, MD;  Location[facility] CATH LAB;  Service: Cardiovascular;  Laterality: N/A;  . RIGHT HEART CATHETERIZATION Right 01/24/2013   Procedure: RIGHT HEART CATH;  Surgeon: Blane Ohara, MD;  Location: [facility] CATH LAB;  Service: Cardiovascular;  Laterality: Right;  . VENTRICULAR ANEURYSM RESECTION N/A 01/28/2013   Procedure: LEFT VENTRICULAR ANEURYSM REPAIR;  Surgeon: Melrose Nakayama, MD;  Location: [place];  Service: Open Heart Surgery;  Laterality: N/A;     No outpatient medications have been marked as taking for the 12/24/19 encounter (Office Visit) with Martinique, Sherif Millspaugh M, MD.     Allergies:   Patient has no known allergies.   Social History   Tobacco Use  . Smoking status: Current Every Day Smoker    Packs/day: 1.50    Years: 35.00    Pack years: 52.50    Types: Cigarettes  . Smokeless tobacco: Never Used  Vaping Use  . Vaping Use: Never used  Substance Use Topics  . Alcohol use: No    Alcohol/week: 0.0 standard drinks  . Drug use: No     Family Hx: The patient's family history includes CAD in an other family member; Diabetes in his mother; HIV in his  brother; Heart attack in his mother; Hypertension in his father and mother. There is no history of Stroke.  ROS:   Please see the history of present illness.   All other systems reviewed and are negative.   Prior CV studies:   The following studies were reviewed today:  Echo 01/26/19: IMPRESSIONS    1. Left ventricular ejection fraction, by visual estimation, is 25 to 30%. The left ventricle has normal function. Moderately increased left ventricular size. There is no left ventricular hypertrophy.  2. There is akinesis of the inferior and nferolateral wall,.  3. Elevated left ventricular end-diastolic pressure.  4. Left ventricular diastolic Doppler parameters are consistent with restrictive filling pattern of LV diastolic filling.  5. Global right ventricle has  normal systolic function.The right ventricular size is normal. No increase in right ventricular wall thickness.  6. Left atrial size was mildly dilated.  7. Right atrial size was normal.  8. The mitral valve is normal in structure. No evidence of mitral valve regurgitation. No evidence of mitral stenosis.  9. The tricuspid valve is normal in structure. Tricuspid valve regurgitation was not visualized by color flow Doppler. 10. The aortic valve is tricuspid Aortic valve regurgitation was not visualized by color flow Doppler. Mild aortic valve sclerosis without stenosis. 11. The pulmonic valve was normal in structure. Pulmonic valve regurgitation is trivial by color flow Doppler. 12. TR signal is inadequate for assessing pulmonary artery systolic pressure. 13. The inferior vena cava is dilated in size with <50% respiratory variability, suggesting right atrial pressure of 15 mmHg.  Echo 06/29/19: IMPRESSIONS    1. Difficult acoustic windows. LVEF is severely depressed at  approximately 20 to 25% with akinesis of the inferior, and posterior  walls; hypokinesis elsewhere.   Compared to previous echo report, no significant change. . Left  ventricular ejection fraction, by estimation, is 20 to 25%. The left  ventricle has severely decreased function. The left ventricle demonstrates  regional wall motion abnormalities (see  scoring diagram/findings for description). The left ventricular internal  cavity size was mildly dilated. Left ventricular diastolic parameters are  consistent with Grade I diastolic dysfunction (impaired relaxation).  2. Difficult to see RV free wall . Right ventricular systolic function is  mildly reduced. The right ventricular size is normal.   Labs/Other Tests and Data Reviewed:    EKG:  None today.   Recent Labs: 02/12/2019: ALT 21; BNP 384.2; Magnesium 2.1 11/16/2019: BUN 15; Creatinine, Ser 0.89; Potassium 4.1; Sodium 139   Recent Lipid Panel Lab Results   Component Value Date/Time   CHOL 134 02/12/2019 02:36 PM   TRIG 100 02/12/2019 02:36 PM   HDL 53 02/12/2019 02:36 PM   CHOLHDL 2.5 02/12/2019 02:36 PM   CHOLHDL 2.5 11/03/2015 04:09 PM   LDLCALC 62 02/12/2019 02:36 PM    Wt Readings from Last 3 Encounters:  12/24/19 266 lb 9.6 oz (120.9 kg)  10/22/19 262 lb 9.6 oz (119.1 kg)  07/09/19 254 lb (115.2 kg)     Objective:    Vital Signs:  BP 104/70   Pulse 62   Ht 6' 1"  (1.854 m)   Wt 266 lb 9.6 oz (120.9 kg)   SpO2 95%   BMI 35.17 kg/m   GENERAL:  Well appearing BM, overweight HEENT:  PERRL, EOMI, sclera are clear. Oropharynx is clear. NECK:  No jugular venous distention, carotid upstroke brisk and symmetric, no bruits, no thyromegaly or adenopathy LUNGS:  Clear to auscultation bilaterally CHEST:  Unremarkable HEART:  RRR,  PMI not displaced or sustained,S1 and S2 within normal limits, no S3, no S4: no clicks, no rubs, no murmurs ABD:  Soft, nontender. BS +, no masses or bruits. No hepatomegaly, no splenomegaly EXT:  2 + pulses throughout, no edema, no cyanosis no clubbing SKIN:  Warm and dry.  No rashes NEURO:  Alert and oriented x 3. Cranial nerves II through XII intact. PSYCH:  Cognitively intact    ASSESSMENT & PLAN:    1. Chronic systolic CHF. EF 20-25% by most recent Echo. Symptoms improved to class 2.  Ischemic cardiomyopathy. Medical therapy has now been optimized without significant improvement in EF. Patient seen by Dr Rayann Heman in March for consideration of ICD. He tells me today that he has decided to proceed and will contact Dr Rayann Heman.    2. CAD s/p remote stenting of LAD and LCx. CTO of the RCA. S/p CABG x 1 in 2014. See #1. No chest pain.   3. History of pericardial tamponade due to LV rupture. S/p patch repair in 2014. No recurrent effusion  4. History of CVA 2014  5. Hyperlipidemia- last LDL at goal  6. Pre diabetes.   7. Tobacco abuse- smoking < 1ppd.  recommend smoking cessation.   Follow up in 6  months.  Signed, Marzell Allemand Martinique, MD  12/24/2019 4:03 PM    [facility]

## 2019-12-24 ENCOUNTER — Ambulatory Visit (INDEPENDENT_AMBULATORY_CARE_PROVIDER_SITE_OTHER): Payer: Medicare HMO | Admitting: Cardiology

## 2019-12-24 ENCOUNTER — Other Ambulatory Visit: Payer: Self-pay

## 2019-12-24 ENCOUNTER — Encounter: Payer: Self-pay | Admitting: Cardiology

## 2019-12-24 VITALS — BP 104/70 | HR 62 | Ht 73.0 in | Wt 266.6 lb

## 2019-12-24 DIAGNOSIS — Z951 Presence of aortocoronary bypass graft: Secondary | ICD-10-CM | POA: Diagnosis not present

## 2019-12-24 DIAGNOSIS — I255 Ischemic cardiomyopathy: Secondary | ICD-10-CM

## 2019-12-24 DIAGNOSIS — I519 Heart disease, unspecified: Secondary | ICD-10-CM

## 2019-12-24 DIAGNOSIS — I25810 Atherosclerosis of coronary artery bypass graft(s) without angina pectoris: Secondary | ICD-10-CM

## 2019-12-24 DIAGNOSIS — E785 Hyperlipidemia, unspecified: Secondary | ICD-10-CM

## 2019-12-24 DIAGNOSIS — Z720 Tobacco use: Secondary | ICD-10-CM

## 2019-12-24 DIAGNOSIS — I10 Essential (primary) hypertension: Secondary | ICD-10-CM

## 2020-01-25 ENCOUNTER — Encounter: Payer: Self-pay | Admitting: Family

## 2020-01-25 ENCOUNTER — Ambulatory Visit: Payer: Medicaid Other | Admitting: Internal Medicine

## 2020-01-25 ENCOUNTER — Other Ambulatory Visit: Payer: Self-pay

## 2020-01-25 ENCOUNTER — Ambulatory Visit: Payer: Medicare HMO | Attending: Family | Admitting: Family

## 2020-01-25 VITALS — BP 102/69 | HR 61 | Temp 96.4°F | Ht 71.0 in | Wt 269.4 lb

## 2020-01-25 DIAGNOSIS — Z0000 Encounter for general adult medical examination without abnormal findings: Secondary | ICD-10-CM | POA: Diagnosis not present

## 2020-01-25 DIAGNOSIS — Z122 Encounter for screening for malignant neoplasm of respiratory organs: Secondary | ICD-10-CM

## 2020-01-25 DIAGNOSIS — Z5320 Procedure and treatment not carried out because of patient's decision for unspecified reasons: Secondary | ICD-10-CM

## 2020-01-25 NOTE — Progress Notes (Signed)
  Subjective:   [name] is a 59 y.o. male who presents for a Welcome to Medicare exam.   Review of Systems:  Cardiac Risk Factors include: smoking/ tobacco exposure   Objective:    Today's Vitals   01/25/20 1450 01/25/20 1451  BP: 102/69   Pulse: 61   Temp: (!) 96.4 F (35.8 C)   SpO2: 98%   Weight: 269 lb 6.4 oz (122.2 kg)   Height: 5' 11" (1.803 m)   PainSc: 8  8    Body mass index is 37.57 kg/m.  Medications Outpatient Encounter Medications as of 01/25/2020  Medication Sig  . ALPHAGAN P 0.1 % SOLN INSTILL 1 DROP INTO BOTH EYES TWICE A DAY  . aspirin 81 MG EC tablet Take 1 tablet (81 mg total) by mouth daily. Restart on 07/05/2018  . atorvastatin (LIPITOR) 80 MG tablet TAKE 1 TABLET BY MOUTH EVERY DAY  . carvedilol (COREG) 25 MG tablet Take 1 tablet (25 mg total) by mouth 2 (two) times daily with a meal.  . Cholecalciferol (VITAMIN D) 2000 units tablet Take 2,000 Units by mouth daily.  . ENTRESTO 97-103 MG TAKE 1 TABLET BY MOUTH TWICE A DAY  . gabapentin (NEURONTIN) 300 MG capsule TAKE 1 CAPSULE BY MOUTH EVERYDAY AT BEDTIME  . latanoprost (XALATAN) 0.005 % ophthalmic solution Place 1 drop into both eyes daily.  . Multiple Vitamin (MULTIVITAMIN WITH MINERALS) TABS tablet Take 1 tablet by mouth daily.  . nitroGLYCERIN (NITROSTAT) 0.4 MG SL tablet Place 1 tablet (0.4 mg total) under the tongue every 5 (five) minutes as needed for chest pain (up to 3 doses).  . spironolactone (ALDACTONE) 25 MG tablet Take 1 tablet (25 mg total) by mouth daily.   No facility-administered encounter medications on file as of 01/25/2020.     History: Past Medical History:  Diagnosis Date  . Abnormal EKG    hx of ischemia showing up on ekg's  . Acute myopericarditis    a. 03/2013: readm with hypoxia, tachycardia, elevated ESR/CRP, elevated troponin with 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and myopericarditis; treated with steroids.  . Acute respiratory failure ([facility])    a. 01/2013: VDRF. b.  03/2013: hypoxia requiring supp O2 during adm, resolved by discharge.  . C. difficile colitis    a. 01/2013 during prolonged adm. b. Recurred 03/2013.  . Cardiac tamponade    a. 01/2013 s/p drain.  . Cataracts, bilateral   . Coronary artery disease    a. s/p MI in 2009 in [place] with stenting of the LCX and LAD;  b. 12/2012 NSTEMI/CAD: LM nl, LAD patent prox stent, LCX 50-70 isr (FFR 0.84), RCA dom, 100m, EF 40-45%-->Med Rx. c. 01/2013: anterolateral STEMI complicated by pericardial effusion (presumed purulent pericarditis) and tamponade s/p drain, ruptured LV pseudoaneurysm s/p CorMatrix patch, CABGx1 (SVG-OM1), fever, CVA, VDRF, C diff.  . CVA (cerebral infarction)    a. 01/2013 in setting of prolonged hospitalization - residual L arm weakness.  . 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([facility])    a. 03/2013: readm with hypoxia, tachycardia, elevated ESR/CRP, elevated troponin with 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and myopericarditis; treated with steroids.  . Glaucoma   . Hyperlipidemia   . Hypokalemia   . Hyponatremia    a. During late 2014.  . Ischemic cardiomyopathy    a. Sept/Oct 2014: EF ~40% (ICM). b. 03/2013: EF 25-30%. Off ACEI due to hypotension (MIXED NICM/ICM).  . Optic neuropathy, ischemic   . Pericardial effusion    a. 01/2013: pericardial effusion (presumed purulent pericarditis) and cardiac   tamponade s/p drain. b. Persistent moderate pericardial effusion 03/2013.  . Pre-diabetes   . Pseudoaneurysm of left ventricle of heart    a. 01/2013:  Ruptured inferoposterior LV pseudoaneurysm s/p CorMatrix patch.  . Sinus bradycardia    asymptomatic  . Stroke ([facility]) 2009   weak lt side-lt arm  . Tobacco abuse   . Wears dentures    top   Past Surgical History:  Procedure Laterality Date  . CARDIAC CATHETERIZATION  01/06/2013  . COLONOSCOPY WITH PROPOFOL N/A 07/03/2016   Procedure: COLONOSCOPY WITH PROPOFOL;  Surgeon: Steven Paul Armbruster, MD;  [facility]: [facility];  Service: Gastroenterology;  Laterality: N/A;  .  CORONARY ANGIOPLASTY WITH STENT PLACEMENT  2000's X 2   "1 + 1" (01/06/2013)  . CORONARY ARTERY BYPASS GRAFT N/A 01/28/2013   Procedure: CORONARY ARTERY BYPASS GRAFTING (CABG);  Surgeon: Steven C Hendrickson, MD;  Location: [facility];  Service: Open Heart Surgery;  Laterality: N/A;  CABG x one, using left greater saphenous vein harvested endoscopically  . INTRAOPERATIVE TRANSESOPHAGEAL ECHOCARDIOGRAM N/A 01/28/2013   Procedure: INTRAOPERATIVE TRANSESOPHAGEAL ECHOCARDIOGRAM;  Surgeon: Steven C Hendrickson, MD;  Location: [facility];  Service: Open Heart Surgery;  Laterality: N/A;  . LEFT HEART CATHETERIZATION WITH CORONARY ANGIOGRAM N/A 01/06/2013   Procedure: LEFT HEART CATHETERIZATION WITH CORONARY ANGIOGRAM;  Surgeon: Peter M Jordan, MD;  Location: [facility];  Service: Cardiovascular;  Laterality: N/A;  . LEFT HEART CATHETERIZATION WITH CORONARY ANGIOGRAM N/A 01/21/2013   Procedure: LEFT HEART CATHETERIZATION WITH CORONARY ANGIOGRAM;  Surgeon: Christopher D McAlhany, MD;  Location: [facility];  Service: Cardiovascular;  Laterality: N/A;  . LUMBAR LAMINECTOMY/ DECOMPRESSION WITH MET-RX Right 06/30/2018   Procedure: Right minimally invasive Lumbar Three-Four Far lateral discectomy;  Surgeon: Ostergard, Thomas A, MD;  Location: [facility];  Service: Neurosurgery;  Laterality: Right;  Right minimally invasive Lumbar Three-Four Far lateral discectomy  . MASS EXCISION Right 07/21/2014   Procedure: EXCISION RIGHT NECK MASS;  Surgeon: Thomas Cornett, MD;  Location: [facility];  Service: General;  Laterality: Right;  . PERICARDIAL TAP N/A 01/24/2013   Procedure: PERICARDIAL TAP;  Surgeon: Michael D Cooper, MD;  Location: [facility];  Service: Cardiovascular;  Laterality: N/A;  . RIGHT HEART CATHETERIZATION Right 01/24/2013   Procedure: RIGHT HEART CATH;  Surgeon: Michael D Cooper, MD;  Location: [facility];  Service: Cardiovascular;  Laterality: Right;  . VENTRICULAR ANEURYSM RESECTION N/A 01/28/2013    Procedure: LEFT VENTRICULAR ANEURYSM REPAIR;  Surgeon: Steven C Hendrickson, MD;  Location: [facility];  Service: Open Heart Surgery;  Laterality: N/A;    Family History  Problem Relation Age of Onset  . Heart attack Mother   . Diabetes Mother   . Hypertension Mother   . Hypertension Father   . CAD Other   . HIV Brother   . Stroke Neg Hx    Social History   Occupational History  . Occupation: N/A  Tobacco Use  . Smoking status: Current Every Day Smoker    Packs/day: 1.00    Years: 35.00    Pack years: 35.00    Types: Cigarettes  . Smokeless tobacco: Never Used  Vaping Use  . Vaping Use: Never used  Substance and Sexual Activity  . Alcohol use: No    Alcohol/week: 0.0 standard drinks  . Drug use: No  . Sexual activity: Yes   Tobacco Counseling Ready to quit: No Counseling given: Not Answered Yes, 1 pack daily on average and sometimes more in the past   for at least 30 years.  Immunizations and Health Maintenance Immunization History  Administered Date(s) Administered  . Moderna SARS-COVID-2 Vaccination 05/29/2019, 06/26/2019  . Tdap 11/14/2016   Health Maintenance Due  Topic Date Due  . Hepatitis C Screening  Never done  . HIV Screening  Never done    Activities of Daily Living In your present state of health, do you have any difficulty performing the following activities: 01/25/2020  Hearing? N  Vision? Y  Difficulty concentrating or making decisions? Y  Comment sometimes  Walking or climbing stairs? Y  Dressing or bathing? N  Doing errands, shopping? Y  Preparing Food and eating ? Y  Using the Toilet? N  In the past six months, have you accidently leaked urine? Y  Do you have problems with loss of bowel control? N  Managing your Medications? N  Managing your Finances? N  Housekeeping or managing your Housekeeping? N  Some recent data might be hidden  - Vision-  Eyesight not quite the same and has to look down to make sure he doesn't trip and fall -  Back/bending issues - standing more than 5 minutes and lifting anything over 5 pounds causes discomfort - Left side minimal mobility weak from stroke - Trouble concentrating and putting sentences together as a side effect of stroke - Difficulty remembering things as a side effect of stroke  - Does get tired easily - Frustration a lot because of physical being/stroke side effects - Does have home attendant who helps with shopping, errands, and preparing food about 2 hours daily from [employer]. - Urine does leak sometimes, does have urge to urinate, not interested in adult briefs.   Physical Exam General appearance - alert, well appearing, and in no distress and oriented to person, place, and time Mental status - alert, oriented to person, place, and time, normal mood, behavior, speech, dress, motor activity, and thought processes Neck - supple, no significant adenopathy Lymphatics - no palpable lymphadenopathy, no hepatosplenomegaly Chest - clear to auscultation, no wheezes, rales or rhonchi, symmetric air entry, no tachypnea, retractions or cyanosis Heart - normal rate, regular rhythm, normal S1, S2, no murmurs, rubs, clicks or gallops  Advanced Directives: Does Patient Have a Medical Advance Directive?: No Would patient like information on creating a medical advance directive?: Yes (Inpatient - patient defers creating a medical advance directive at this time - Information given)  Assessment:    This is a routine wellness  examination for this patient .   Vision/Hearing screen  Hearing Screening   125Hz 250Hz 500Hz 1000Hz 2000Hz 3000Hz 4000Hz 6000Hz 8000Hz  Right ear:           Left ear:             Visual Acuity Screening   Right eye Left eye Both eyes  Without correction:     With correction: 20/30 20/40    - Dr. Richardean Sale, eye doctor, seen 3 months ago, eyes are the same and has cataracts and glaucoma.  Dietary issues and exercise activities discussed:  Current Exercise  Habits: The patient does not participate in regular exercise at present, Exercise limited by: orthopedic condition(s)  Goals    . Quit Smoking     Pt wishes to stop smoking soon        Depression Screen PHQ 2/9 Scores 01/25/2020 10/22/2019 11/14/2016 03/22/2016  PHQ - 2 Score 2 0 0 0  PHQ- 9 Score 9 - - -  - Denies thoughts of self-harm and  suicidal ideation.    Fall Risk Fall Risk  01/25/2020  Falls in the past year? 1  Number falls in past yr: 1  Injury with Fall? 0  Risk for fall due to : Impaired balance/gait  - Had 3 falls in the past year and did not seek medical attention at that time. Reports he was fine. Does have a Life Alert that he plans to activate soon.  Cognitive Function MMSE - Mini Mental State Exam 01/25/2020  Orientation to time 5  Orientation to Place 5  Registration 3  Attention/ Calculation 5  Recall 3  Language- name 2 objects 2  Language- repeat 1  Language- follow 3 step command 3  Language- read & follow direction 1  Write a sentence 1  Copy design 1  Total score 30    Patient Care Team: Johnson, Deborah B, MD as PCP - General (Internal Medicine) Jordan, Peter M, MD as PCP - Cardiology (Cardiology) Shapiro, Mark, MD as PCP - Ophthalmology   Plan:  1. Encounter for Medicare annual wellness exam: - Keep all scheduled appointments with primary physician and consulting physicians.   2. Screening for hepatitis C declined: - Declined.  3. HIV screening declined: - Declined.  4. Screening for lung cancer: - Patient with 30 year pack history and current smoker. Screening for lung cancer indicated and patient agreeable.  - CT CHEST LUNG CA SCREEN LOW DOSE W/O CM; Future  I have personally reviewed and noted the following in the patient's chart:   . Medical and social history . Use of alcohol, tobacco or illicit drugs  . Current medications and supplements . Functional ability and status . Nutritional status . Physical activity . Advanced  directives . List of other physicians . Hospitalizations, surgeries, and ER visits in previous 12 months . Vitals . Screenings to include cognitive, depression, and falls . Referrals and appointments  In addition, I have reviewed and discussed with patient certain preventive protocols, quality metrics, and best practice recommendations. A written personalized care plan for preventive services as well as general preventive health recommendations were provided to patient.     J , NP 01/25/2020     

## 2020-01-25 NOTE — Progress Notes (Signed)
Has lower right  back pain  Needs nitroglycerin and spironolactone refill

## 2020-01-25 NOTE — Patient Instructions (Signed)
  [name] , Thank you for taking time to come for your Medicare Wellness Visit. I appreciate your ongoing commitment to your health goals. Please review the following plan we discussed and let me know if I can assist you in the future.   These are the goals we discussed: Goals    . Quit Smoking     Pt wishes to stop smoking soon        This is a list of the screening recommended for you and due dates:  Health Maintenance  Topic Date Due  .  Hepatitis C: One time screening is recommended by Center for Disease Control  (CDC) for  adults born from [birth year] through 1964.   Never done  . HIV Screening  Never done  . Flu Shot  07/14/2020*  . Colon Cancer Screening  07/04/2026  . Tetanus Vaccine  11/15/2026  . COVID-19 Vaccine  Completed  *Topic was postponed. The date shown is not the original due date.

## 2020-02-29 ENCOUNTER — Encounter: Payer: Self-pay | Admitting: Internal Medicine

## 2020-02-29 ENCOUNTER — Ambulatory Visit: Payer: Medicare HMO | Attending: Internal Medicine | Admitting: Internal Medicine

## 2020-02-29 ENCOUNTER — Other Ambulatory Visit: Payer: Self-pay

## 2020-02-29 VITALS — BP 116/78 | HR 60 | Resp 16 | Wt 264.4 lb

## 2020-02-29 DIAGNOSIS — F17200 Nicotine dependence, unspecified, uncomplicated: Secondary | ICD-10-CM | POA: Diagnosis not present

## 2020-02-29 DIAGNOSIS — I10 Essential (primary) hypertension: Secondary | ICD-10-CM

## 2020-02-29 DIAGNOSIS — Z2821 Immunization not carried out because of patient refusal: Secondary | ICD-10-CM | POA: Diagnosis not present

## 2020-02-29 DIAGNOSIS — E669 Obesity, unspecified: Secondary | ICD-10-CM | POA: Diagnosis not present

## 2020-02-29 DIAGNOSIS — F17210 Nicotine dependence, cigarettes, uncomplicated: Secondary | ICD-10-CM

## 2020-02-29 DIAGNOSIS — R7303 Prediabetes: Secondary | ICD-10-CM | POA: Diagnosis not present

## 2020-02-29 DIAGNOSIS — I255 Ischemic cardiomyopathy: Secondary | ICD-10-CM | POA: Diagnosis not present

## 2020-02-29 MED ORDER — FUROSEMIDE 20 MG PO TABS: 20.0000 mg | ORAL_TABLET | Freq: Every day | ORAL | 0 refills | Status: DC

## 2020-02-29 MED ORDER — NICOTINE 21 MG/24HR TD PT24: 21.0000 mg | MEDICATED_PATCH | Freq: Every day | TRANSDERMAL | 1 refills | Status: DC

## 2020-02-29 NOTE — Patient Instructions (Signed)
You are retaining fluid.  I have prescribed a fluid pill called furosemide for you to take once a day for the next 2 days.  Hold the spironolactone on the days that you take the furosemide.  Restart the spironolactone after you have completed the furosemide.  Try to limit salt in the foods as much as possible.   Healthy Eating Following a healthy eating pattern may help you to achieve and maintain a healthy body weight, reduce the risk of chronic disease, and live a long and productive life. It is important to follow a healthy eating pattern at an appropriate calorie level for your body. Your nutritional needs should be met primarily through food by choosing a variety of nutrient-rich foods. What are tips for following this plan? Reading food labels  Read labels and choose the following: ? Reduced or low sodium. ? Juices with 100% fruit juice. ? Foods with low saturated fats and high polyunsaturated and monounsaturated fats. ? Foods with whole grains, such as whole wheat, cracked wheat, brown rice, and wild rice. ? Whole grains that are fortified with folic acid. This is recommended for women who are pregnant or who want to become pregnant.  Read labels and avoid the following: ? Foods with a lot of added sugars. These include foods that contain brown sugar, corn sweetener, corn syrup, dextrose, fructose, glucose, high-fructose corn syrup, honey, invert sugar, lactose, malt syrup, maltose, molasses, raw sugar, sucrose, trehalose, or turbinado sugar.  Do not eat more than the following amounts of added sugar per day:  6 teaspoons (25 g) for women.  9 teaspoons (38 g) for men. ? Foods that contain processed or refined starches and grains. ? Refined grain products, such as white flour, degermed cornmeal, white bread, and white rice. Shopping  Choose nutrient-rich snacks, such as vegetables, whole fruits, and nuts. Avoid high-calorie and high-sugar snacks, such as potato chips, fruit snacks,  and candy.  Use oil-based dressings and spreads on foods instead of solid fats such as butter, stick margarine, or cream cheese.  Limit pre-made sauces, mixes, and "instant" products such as flavored rice, instant noodles, and ready-made pasta.  Try more plant-protein sources, such as tofu, tempeh, black beans, edamame, lentils, nuts, and seeds.  Explore eating plans such as the Mediterranean diet or vegetarian diet. Cooking  Use oil to saut or stir-fry foods instead of solid fats such as butter, stick margarine, or lard.  Try baking, boiling, grilling, or broiling instead of frying.  Remove the fatty part of meats before cooking.  Steam vegetables in water or broth. Meal planning   At meals, imagine dividing your plate into fourths: ? One-half of your plate is fruits and vegetables. ? One-fourth of your plate is whole grains. ? One-fourth of your plate is protein, especially lean meats, poultry, eggs, tofu, beans, or nuts.  Include low-fat dairy as part of your daily diet. Lifestyle  Choose healthy options in all settings, including home, work, school, restaurants, or stores.  Prepare your food safely: ? Wash your hands after handling raw meats. ? Keep food preparation surfaces clean by regularly washing with hot, soapy water. ? Keep raw meats separate from ready-to-eat foods, such as fruits and vegetables. ? Cook seafood, meat, poultry, and eggs to the recommended internal temperature. ? Store foods at safe temperatures. In general:  Keep cold foods at 51F (4.4C) or below.  Keep hot foods at 151F (60C) or above.  Keep your freezer at [facility] (-17.8C) or below.  Foods are  no longer safe to eat when they have been between the temperatures of 40-140F (4.4-60C) for more than 2 hours. What foods should I eat? Fruits Aim to eat 2 cup-equivalents of fresh, canned (in natural juice), or frozen fruits each day. Examples of 1 cup-equivalent of fruit include 1 small apple,  8 large strawberries, 1 cup canned fruit,  cup dried fruit, or 1 cup 100% juice. Vegetables Aim to eat 2-3 cup-equivalents of fresh and frozen vegetables each day, including different varieties and colors. Examples of 1 cup-equivalent of vegetables include 2 medium carrots, 2 cups raw, leafy greens, 1 cup chopped vegetable (raw or cooked), or 1 medium baked potato. Grains Aim to eat 6 ounce-equivalents of whole grains each day. Examples of 1 ounce-equivalent of grains include 1 slice of bread, 1 cup ready-to-eat cereal, 3 cups popcorn, or  cup cooked rice, pasta, or cereal. Meats and other proteins Aim to eat 5-6 ounce-equivalents of protein each day. Examples of 1 ounce-equivalent of protein include 1 egg, 1/2 cup nuts or seeds, or 1 tablespoon (16 g) peanut butter. A cut of meat or fish that is the size of a deck of cards is about 3-4 ounce-equivalents.  Of the protein you eat each week, try to have at least 8 ounces come from seafood. This includes salmon, trout, herring, and anchovies. Dairy Aim to eat 3 cup-equivalents of fat-free or low-fat dairy each day. Examples of 1 cup-equivalent of dairy include 1 cup (240 mL) milk, 8 ounces (250 g) yogurt, 1 ounces (44 g) natural cheese, or 1 cup (240 mL) fortified soy milk. Fats and oils  Aim for about 5 teaspoons (21 g) per day. Choose monounsaturated fats, such as canola and olive oils, avocados, peanut butter, and most nuts, or polyunsaturated fats, such as sunflower, corn, and soybean oils, walnuts, pine nuts, sesame seeds, sunflower seeds, and flaxseed. Beverages  Aim for six 8-oz glasses of water per day. Limit coffee to three to five 8-oz cups per day.  Limit caffeinated beverages that have added calories, such as soda and energy drinks.  Limit alcohol intake to no more than 1 drink a day for nonpregnant women and 2 drinks a day for men. One drink equals 12 oz of beer (355 mL), 5 oz of wine (148 mL), or 1 oz of hard liquor (44  mL). Seasoning and other foods  Avoid adding excess amounts of salt to your foods. Try flavoring foods with herbs and spices instead of salt.  Avoid adding sugar to foods.  Try using oil-based dressings, sauces, and spreads instead of solid fats. This information is based on general [place] nutrition guidelines. For more information, visit [employer]. Exact amounts may vary based on your nutrition needs. Summary  A healthy eating plan may help you to maintain a healthy weight, reduce the risk of chronic diseases, and stay active throughout your life.  Plan your meals. Make sure you eat the right portions of a variety of nutrient-rich foods.  Try baking, boiling, grilling, or broiling instead of frying.  Choose healthy options in all settings, including home, work, school, restaurants, or stores. This information is not intended to replace advice given to you by your health care provider. Make sure you discuss any questions you have with your health care provider. Document Revised: 07/15/2017 Document Reviewed: 07/15/2017 Elsevier Patient Education  [place].

## 2020-02-29 NOTE — Progress Notes (Signed)
  Patient ID: [name], male    DOB: 01/15/1961  MRN: [id number]  CC: No chief complaint on file.   Subjective: [name] is a 59 y.o. 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  ICM/CAD: ICD recommended by his cardiologist Dr. Jordan.  Last seen 12/2019.  Patient states he plans to schedule for after the holidays.   No CP.  Some fatigue with exertion No LE edema Compliant with taking his medications including carvedilol, spironolactone, atorvastatin and Entresto.  Admits that he has not been limiting salt in the foods.  Tob Dep: down to 1/2 pk from 2 pks/day.  He is trying to quit.  Use the patches in the past and found them helpful.  He is requesting prescription.  Obesity/prediabetes: He is going to [employer] 2 times a week using the treadmill, stationary bike and doing some arm exercises. Eating getting better but he is worried about the upcoming holidays.  Patient Active Problem List   Diagnosis Date Noted  . Hx of CABG 07/09/2019  . Lumbar radiculopathy 12/04/2018  . Glaucoma 12/04/2018  . Cortical age-related cataract of both eyes 12/04/2018  . Pain management contract agreement 12/04/2018  . Lumbar herniated disc 06/04/2018  . Prediabetes 01/01/2018  . Hyperlipidemia 12/30/2017  . Obesity (BMI 30.0-34.9) 12/30/2017  . Benign neoplasm of descending colon   . Essential hypertension 11/03/2015  . Cardiomyopathy, ischemic 06/03/2015  . Chronic systolic dysfunction of left ventricle 06/03/2015  . Erectile dysfunction due to arterial insufficiency 12/23/2014  . Lipoma of skin and subcutaneous tissue of neck 05/24/2014  . History of cardioembolic stroke 11/02/2013  . Vision loss of right eye 06/23/2013  . Dyslipidemia 06/22/2013  . 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(HCC)   . Coronary artery  disease   . Cardiomyopathy (HCC)   . Pseudoaneurysm of left ventricle of heart   . Tobacco abuse      Current Outpatient Medications on File Prior to Visit  Medication Sig Dispense Refill  . ALPHAGAN P 0.1 % SOLN INSTILL 1 DROP INTO BOTH EYES TWICE A DAY 10 mL 0  . aspirin 81 MG EC tablet Take 1 tablet (81 mg total) by mouth daily. Restart on 07/05/2018 90 tablet 3  . atorvastatin (LIPITOR) 80 MG tablet TAKE 1 TABLET BY MOUTH EVERY DAY 90 tablet 1  . carvedilol (COREG) 25 MG tablet Take 1 tablet (25 mg total) by mouth 2 (two) times daily with a meal. 180 tablet 3  . Cholecalciferol (VITAMIN D) 2000 units tablet Take 2,000 Units by mouth daily.    . ENTRESTO 97-103 MG TAKE 1 TABLET BY MOUTH TWICE A DAY 180 tablet 2  . gabapentin (NEURONTIN) 300 MG capsule TAKE 1 CAPSULE BY MOUTH EVERYDAY AT BEDTIME 90 capsule 1  . latanoprost (XALATAN) 0.005 % ophthalmic solution Place 1 drop into both eyes daily.  12  . Multiple Vitamin (MULTIVITAMIN WITH MINERALS) TABS tablet Take 1 tablet by mouth daily.    . nitroGLYCERIN (NITROSTAT) 0.4 MG SL tablet Place 1 tablet (0.4 mg total) under the tongue every 5 (five) minutes as needed for chest pain (up to 3 doses). 25 tablet 0  . spironolactone (ALDACTONE) 25 MG tablet Take 1 tablet (25 mg total) by mouth daily. 90 tablet 3   No current facility-administered medications on file prior to visit.    No Known   Allergies  Social History   Socioeconomic History  . Marital status: Single    Spouse name: Not on file  . Number of children: 2  . Years of education: 12th  . Highest education level: Not on file  Occupational History  . Occupation: N/A  Tobacco Use  . Smoking status: Current Every Day Smoker    Packs/day: 1.00    Years: 35.00    Pack years: 35.00    Types: Cigarettes  . Smokeless tobacco: Never Used  Vaping Use  . Vaping Use: Never used  Substance and Sexual Activity  . Alcohol use: No    Alcohol/week: 0.0 standard drinks  . Drug use:  No  . Sexual activity: Yes  Other Topics Concern  . Not on file  Social History Narrative   Lives in [place] with wife.  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  He is now retired on disability.  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      Caffeine Use: very rarely   Social Determinants of Radio broadcast assistant Strain:   . Difficulty of Paying Living Expenses: Not on file  Food Insecurity:   . Worried About Charity fundraiser in the Last Year: Not on file  . Ran Out of Food in the Last Year: Not on file  Transportation Needs:   . Lack of Transportation (Medical): Not on file  . Lack of Transportation (Non-Medical): Not on file  Physical Activity:   . Days of Exercise per Week: Not on file  . Minutes of Exercise per Session: Not on file  Stress:   . Feeling of Stress : Not on file  Social Connections:   . Frequency of Communication with Friends and Family: Not on file  . Frequency of Social Gatherings with Friends and Family: Not on file  . Attends Religious Services: Not on file  . Active Member of Clubs or Organizations: Not on file  . Attends Archivist Meetings: Not on file  . Marital Status: Not on file  Intimate Partner Violence:   . Fear of Current or Ex-Partner: Not on file  . Emotionally Abused: Not on file  . Physically Abused: Not on file  . Sexually Abused: Not on file    Family History  Problem Relation Age of Onset  . Heart attack Mother   . Diabetes Mother   . Hypertension Mother   . Hypertension Father   . CAD Other   . HIV Brother   . Stroke Neg Hx     Past Surgical History:  Procedure Laterality Date  . CARDIAC CATHETERIZATION  01/06/2013  . COLONOSCOPY WITH PROPOFOL N/A 07/03/2016   Procedure: COLONOSCOPY WITH PROPOFOL;  Surgeon: Manus Gunning, MD;  Location: [facility];  Service: Gastroenterology;  Laterality: N/A;  . CORONARY ANGIOPLASTY WITH STENT PLACEMENT  2000's X 2   "1 + 1" (01/06/2013)  .  CORONARY ARTERY BYPASS GRAFT N/A 01/28/2013   Procedure: CORONARY ARTERY BYPASS GRAFTING (CABG);  Surgeon: Melrose Nakayama, MD;  Location: [facility];  Service: Open Heart Surgery;  Laterality: N/A;  CABG x one, using left greater saphenous vein harvested endoscopically  . INTRAOPERATIVE TRANSESOPHAGEAL ECHOCARDIOGRAM N/A 01/28/2013   Procedure: INTRAOPERATIVE TRANSESOPHAGEAL ECHOCARDIOGRAM;  Surgeon: Melrose Nakayama, MD;  Location: [facility];  Service: Open Heart Surgery;  Laterality: N/A;  . LEFT HEART CATHETERIZATION WITH CORONARY ANGIOGRAM N/A 01/06/2013   Procedure: LEFT HEART CATHETERIZATION WITH CORONARY ANGIOGRAM;  Surgeon:  Peter M Martinique, MD;  Location[facility] C[facility] LAB;  Service: Cardiovascular;  Laterality: N/A;  . LEFT HEART CATHETERIZATION WITH CORONARY ANGIOGRAM N/A 01/21/2013   Procedure: LEFT HEART CATHETERIZATION WITH CORONARY ANGIOGRAM;  Surgeon: Burnell Blanks, MD;  Location[facility] CATH LAB;  Service: Cardiovascular;  Laterality: N/A;  . LUMBAR LAMINECTOMY/ DECOMPRESSION WITH MET-RX Right 06/30/2018   Procedure: Right minimally invasive Lumbar Three-Four Far lateral discectomy;  Surgeon: Judith Part, MD;  Location: [facility];  Service: Neurosurgery;  Laterality: Right;  Right minimally invasive Lumbar Three-Four Far lateral discectomy  . MASS EXCISION Right 07/21/2014   Procedure: EXCISION RIGHT NECK MASS;  Surgeon: Erroll Luna, MD;  Location: [place];  Service: General;  Laterality: Right;  . PERICARDIAL TAP N/A 01/24/2013   Procedure: PERICARDIAL TAP;  Surgeon: Blane Ohara, MD;  Location: [facility] CATH LAB;  Service: Cardiovascular;  Laterality: N/A;  . RIGHT HEART CATHETERIZATION Right 01/24/2013   Procedure: RIGHT HEART CATH;  Surgeon: Blane Ohara, MD;  Location: [facility] CATH LAB;  Service: Cardiovascular;  Laterality: Right;  . VENTRICULAR ANEURYSM RESECTION N/A 01/28/2013   Procedure: LEFT VENTRICULAR ANEURYSM REPAIR;  Surgeon: Melrose Nakayama, MD;   Location: [facility];  Service: Open Heart Surgery;  Laterality: N/A;    ROS: Review of Systems Negative except as stated above  PHYSICAL EXAM: BP 116/78   Pulse 60   Resp 16   Wt 264 lb 6.4 oz (119.9 kg)   SpO2 97%   BMI 36.88 kg/m   Wt Readings from Last 3 Encounters:  02/29/20 264 lb 6.4 oz (119.9 kg)  01/25/20 269 lb 6.4 oz (122.2 kg)  12/24/19 266 lb 9.6 oz (120.9 kg)    Physical Exam  General appearance - alert, well appearing, older African-American male and in no distress Mental status - normal mood, behavior, speech, dress, motor activity, and thought processes Neck - supple, no significant adenopathy Chest - clear to auscultation, no wheezes, rales or rhonchi, symmetric air entry Heart - normal rate, regular rhythm, normal S1, S2, no murmurs, rubs, clicks or gallops Extremities -1+ bilateral lower extremity edema.    CMP Latest Ref Rng & Units 11/16/2019 05/07/2019 03/25/2019  Glucose 65 - 99 mg/dL 95 93 100(H)  BUN 6 - 24 mg/dL _0 Creatinine 0.76 - 1.27 mg/dL 0.89 0.93 0.90  Sodium 134 - 144 mmol/L 139 141 143  Potassium 3.5 - 5.2 mmol/L 4.1 4.5 4.2  Chloride 96 - 106 mmol/L 105 105 106  CO2 20 - 29 mmol/L _1 Calcium 8.7 - 10.2 mg/dL 9.4 9.6 9.5  Total Protein 6.0 - 8.5 g/dL - - -  Total Bilirubin 0.0 - 1.2 mg/dL - - -  Alkaline Phos 39 - 117 IU/L - - -  AST 0 - 40 IU/L - - -  ALT 0 - 44 IU/L - - -   Lipid Panel     Component Value Date/Time   CHOL 134 02/12/2019 1436   TRIG 100 02/12/2019 1436   HDL 53 02/12/2019 1436   CHOLHDL 2.5 02/12/2019 1436   CHOLHDL 2.5 11/03/2015 1609   VLDL 19 11/03/2015 1609   LDLCALC 62 02/12/2019 1436    CBC    Component Value Date/Time   WBC 5.3 06/30/2018 1139   RBC 5.04 06/30/2018 1139   HGB 14.6 06/30/2018 1139   HGB 14.8 12/30/2017 1450   HCT 44.3 06/30/2018 1139   HCT 43.4 12/30/2017 1450   PLT 142 (L)  06/30/2018 1139   PLT 154 12/30/2017 1450   MCV 87.9 06/30/2018 1139   MCV 85 12/30/2017 1450    MCH 29.0 06/30/2018 1139   MCHC 33.0 06/30/2018 1139   RDW 13.1 06/30/2018 1139   RDW 12.8 12/30/2017 1450   LYMPHSABS 1.9 03/29/2018 1333   MONOABS 0.7 03/29/2018 1333   EOSABS 0.1 03/29/2018 1333   BASOSABS 0.0 03/29/2018 1333    ASSESSMENT AND PLAN: 1. Essential hypertension At goal.  Continue current medications and low-salt diet - CBC - Comprehensive metabolic panel - Lipid panel  2. Obesity (BMI 35.0-39.9 without comorbidity) 3. Prediabetes Discussed and encourage strategies to eat healthy during the holidays and be on.  Continue regular exercise as tolerated. - Hemoglobin A1c  4. Ischemic cardiomyopathy Patient with some fluid retention.  Admits that he is not been limiting his salt as much as he should.  Stressed the importance of this to him.  I will have him take furosemide for 2 days. He plans to get his ICD in the new year. - furosemide (LASIX) 20 MG tablet; Take 1 tablet (20 mg total) by mouth daily.  Dispense: 2 tablet; Refill: 0  5. Tobacco dependence Advised to quit.  He is currently working on trying to quit.  I have commended him on how much he has cut back.  Prescription sent to his pharmacy for nicotine patches.  We discussed the stepdown method.  We will start him on the 21 mg first.  Less than 5 minutes spent on counseling. - nicotine (NICODERM CQ - DOSED IN MG/24 HOURS) 21 mg/24hr patch; Place 1 patch (21 mg total) onto the skin daily.  Dispense: 28 patch; Refill: 1  6. Influenza vaccination declined This was offered.  Patient declined.    Patient was given the opportunity to ask questions.  Patient verbalized understanding of the plan and was able to repeat key elements of the plan.   No orders of the defined types were placed in this encounter.    Requested Prescriptions    No prescriptions requested or ordered in this encounter    No follow-ups on file.   , MD, FACP 

## 2020-03-01 ENCOUNTER — Telehealth: Payer: Self-pay | Admitting: Internal Medicine

## 2020-03-01 LAB — CBC
Hematocrit: 43.7 % (ref 37.5–51.0)
Hemoglobin: 14.9 g/dL (ref 13.0–17.7)
MCH: 30 pg (ref 26.6–33.0)
MCHC: 34.1 g/dL (ref 31.5–35.7)
MCV: 88 fL (ref 79–97)
Platelets: 148 10*3/uL — ABNORMAL LOW (ref 150–450)
RBC: 4.96 x10E6/uL (ref 4.14–5.80)
RDW: 12.8 % (ref 11.6–15.4)
WBC: 5 10*3/uL (ref 3.4–10.8)

## 2020-03-01 LAB — COMPREHENSIVE METABOLIC PANEL
ALT: 15 IU/L (ref 0–44)
AST: 14 IU/L (ref 0–40)
Albumin/Globulin Ratio: 2 (ref 1.2–2.2)
Albumin: 4.3 g/dL (ref 3.8–4.9)
Alkaline Phosphatase: 137 IU/L — ABNORMAL HIGH (ref 44–121)
BUN/Creatinine Ratio: 12 (ref 9–20)
BUN: 10 mg/dL (ref 6–24)
Bilirubin Total: 0.3 mg/dL (ref 0.0–1.2)
CO2: 26 mmol/L (ref 20–29)
Calcium: 9.3 mg/dL (ref 8.7–10.2)
Chloride: 107 mmol/L — ABNORMAL HIGH (ref 96–106)
Creatinine, Ser: 0.84 mg/dL (ref 0.76–1.27)
GFR calc Af Amer: 112 mL/min/{1.73_m2} (ref >59)
GFR calc non Af Amer: 97 mL/min/{1.73_m2} (ref >59)
Globulin, Total: 2.2 g/dL (ref 1.5–4.5)
Glucose: 87 mg/dL (ref 65–99)
Potassium: 4.2 mmol/L (ref 3.5–5.2)
Sodium: 143 mmol/L (ref 134–144)
Total Protein: 6.5 g/dL (ref 6.0–8.5)

## 2020-03-01 LAB — LIPID PANEL
Chol/HDL Ratio: 2.8 ratio (ref 0.0–5.0)
Cholesterol, Total: 162 mg/dL (ref 100–199)
HDL: 57 mg/dL (ref >39)
LDL Chol Calc (NIH): 85 mg/dL (ref 0–99)
Triglycerides: 110 mg/dL (ref 0–149)
VLDL Cholesterol Cal: 20 mg/dL (ref 5–40)

## 2020-03-01 LAB — HEMOGLOBIN A1C
Est. average glucose Bld gHb Est-mCnc: 131 mg/dL
Hgb A1c MFr Bld: 6.2 % — ABNORMAL HIGH (ref 4.8–5.6)

## 2020-03-01 NOTE — Telephone Encounter (Signed)
Spoke with pt and he states he would like to proceed with the plan to have an ICD placed but he wants to do it in February.  Advised he would need to come in and see Dr. Rayann Heman prior.  Advised I will have EP scheduler contact him to make a January appt with Dr. Rayann Heman.  Pt appreciative for call.

## 2020-03-01 NOTE — Telephone Encounter (Signed)
Patient wanted to discuss what needs to be done next for him to get his defibrillator.

## 2020-03-02 ENCOUNTER — Telehealth: Payer: Self-pay

## 2020-03-02 NOTE — Progress Notes (Signed)
His blood cell counts are normal except for slight decrease in his platelet cell counts but it is stable compared to March of last year.  Platelets are the blood cells that help the blood clot normally. Kidney function tests normal.Liver function tests normal except for mild elevation in one of his liver enzymes but this has improved compared to 1 year ago. LDL cholesterol is 85 with goal being less than 70.  Continue the atorvastatin and continue to work on healthy eating habits.He is still in the range for prediabetes.  Healthy eating habits and regular exercise will help prevent progression to full diabetes.

## 2020-03-02 NOTE — Telephone Encounter (Signed)
Contacted pt to go over lab results pt is aware and doesn't have any questions or concerns 

## 2020-03-03 DIAGNOSIS — R6889 Other general symptoms and signs: Secondary | ICD-10-CM | POA: Diagnosis not present

## 2020-03-07 DIAGNOSIS — R6889 Other general symptoms and signs: Secondary | ICD-10-CM | POA: Diagnosis not present

## 2020-03-14 DIAGNOSIS — H52203 Unspecified astigmatism, bilateral: Secondary | ICD-10-CM | POA: Diagnosis not present

## 2020-03-14 DIAGNOSIS — H5203 Hypermetropia, bilateral: Secondary | ICD-10-CM | POA: Diagnosis not present

## 2020-03-14 DIAGNOSIS — R6889 Other general symptoms and signs: Secondary | ICD-10-CM | POA: Diagnosis not present

## 2020-03-14 DIAGNOSIS — H524 Presbyopia: Secondary | ICD-10-CM | POA: Diagnosis not present

## 2020-03-14 DIAGNOSIS — H401132 Primary open-angle glaucoma, bilateral, moderate stage: Secondary | ICD-10-CM | POA: Diagnosis not present

## 2020-03-14 DIAGNOSIS — H2513 Age-related nuclear cataract, bilateral: Secondary | ICD-10-CM | POA: Diagnosis not present

## 2020-03-17 DIAGNOSIS — R6889 Other general symptoms and signs: Secondary | ICD-10-CM | POA: Diagnosis not present

## 2020-03-23 ENCOUNTER — Other Ambulatory Visit: Payer: Self-pay | Admitting: Internal Medicine

## 2020-03-23 DIAGNOSIS — I255 Ischemic cardiomyopathy: Secondary | ICD-10-CM

## 2020-03-24 DIAGNOSIS — R6889 Other general symptoms and signs: Secondary | ICD-10-CM | POA: Diagnosis not present

## 2020-04-01 DIAGNOSIS — R6889 Other general symptoms and signs: Secondary | ICD-10-CM | POA: Diagnosis not present

## 2020-04-05 ENCOUNTER — Other Ambulatory Visit: Payer: Self-pay | Admitting: Internal Medicine

## 2020-04-05 DIAGNOSIS — I255 Ischemic cardiomyopathy: Secondary | ICD-10-CM

## 2020-04-12 DIAGNOSIS — R6889 Other general symptoms and signs: Secondary | ICD-10-CM | POA: Diagnosis not present

## 2020-04-18 ENCOUNTER — Other Ambulatory Visit: Payer: Self-pay

## 2020-04-18 ENCOUNTER — Encounter: Payer: Self-pay | Admitting: Internal Medicine

## 2020-04-18 ENCOUNTER — Encounter: Payer: Self-pay | Admitting: *Deleted

## 2020-04-18 ENCOUNTER — Ambulatory Visit (INDEPENDENT_AMBULATORY_CARE_PROVIDER_SITE_OTHER): Payer: Medicare HMO | Admitting: Internal Medicine

## 2020-04-18 VITALS — BP 100/72 | HR 58 | Ht 71.0 in | Wt 263.0 lb

## 2020-04-18 DIAGNOSIS — I5022 Chronic systolic (congestive) heart failure: Secondary | ICD-10-CM | POA: Diagnosis not present

## 2020-04-18 DIAGNOSIS — I255 Ischemic cardiomyopathy: Secondary | ICD-10-CM | POA: Diagnosis not present

## 2020-04-18 DIAGNOSIS — R6889 Other general symptoms and signs: Secondary | ICD-10-CM | POA: Diagnosis not present

## 2020-04-18 NOTE — Progress Notes (Signed)
PCP: Ladell Pier, MD Primary Cardiologist: Dr Martinique Primary EP: Dr Aileen Pilot is a 60 y.o. male who presents today for routine electrophysiology followup.  I saw him last 07/09/19 at which time we considered an ICD.  He opted to decline at that time.  He now presents for further discussion.  Since last being seen in our clinic, the patient reports doing very well.  Today, he denies symptoms of palpitations, chest pain, shortness of breath,  lower extremity edema, dizziness, presyncope, or syncope.  The patient is otherwise without complaint today.   Past Medical History:  Diagnosis Date  . Abnormal EKG    hx of ischemia showing up on ekg's  . Acute myopericarditis    a. 03/2013: readm with hypoxia, tachycardia, elevated ESR/CRP, elevated troponin with 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and myopericarditis; treated with steroids.  . Acute respiratory failure ([place])    a. 01/2013: VDRF. b. 03/2013: hypoxia requiring supp O2 during adm, resolved by discharge.  . C. difficile colitis    a. 01/2013 during prolonged adm. b. Recurred 03/2013.  . Cardiac tamponade    a. 01/2013 s/p drain.  . Cataracts, bilateral   . Coronary artery disease    a. s/p MI in 2009 in [place] with stenting of the LCX and LAD;  b. 12/2012 NSTEMI/CAD: LM nl, LAD patent prox stent, LCX 50-70 isr (FFR 0.84), RCA dom, 117m EF 40-45%-->Med Rx. c. 01/2013: anterolateral STEMI complicated by pericardial effusion (presumed purulent pericarditis) and tamponade s/p drain, ruptured LV pseudoaneurysm s/p CorMatrix patch, CABGx1 (SVG-OM1), fever, CVA, VDRF, C diff.  . CVA (cerebral infarction)    a. 01/2013 in setting of prolonged hospitalization - residual L arm weakness.  . 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    a. 03/2013: readm with hypoxia, tachycardia, elevated ESR/CRP, elevated troponin with 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and myopericarditis; treated with steroids.  . Glaucoma   . Hyperlipidemia   . Hypokalemia   . Hyponatremia    a. During late  2014.  . Ischemic cardiomyopathy    a. Sept/Oct 2014: EF ~40% (ICM). b. 03/2013: EF 25-30%. Off ACEI due to hypotension (MIXED NICM/ICM).  .Marland KitchenOptic neuropathy, ischemic   . Pericardial effusion    a. 01/2013: pericardial effusion (presumed purulent pericarditis) and cardiac tamponade s/p drain. b. Persistent moderate pericardial effusion 03/2013.  . Pre-diabetes   . Pseudoaneurysm of left ventricle of heart    a. 01/2013:  Ruptured inferoposterior LV pseudoaneurysm s/p CorMatrix patch.  . Sinus bradycardia    asymptomatic  . Stroke ([facility] 2009   weak lt side-lt arm  . Tobacco abuse   . Wears dentures    top   Past Surgical History:  Procedure Laterality Date  . CARDIAC CATHETERIZATION  01/06/2013  . COLONOSCOPY WITH PROPOFOL N/A 07/03/2016   Procedure: COLONOSCOPY WITH PROPOFOL;  Surgeon: SManus Gunning MD;  Location: [facility];  Service: Gastroenterology;  Laterality: N/A;  . CORONARY ANGIOPLASTY WITH STENT PLACEMENT  2000's X 2   "1 + 1" (01/06/2013)  . CORONARY ARTERY BYPASS GRAFT N/A 01/28/2013   Procedure: CORONARY ARTERY BYPASS GRAFTING (CABG);  Surgeon: SM[place]rose Nakayama MD;  [place]  Service: Open Heart Surgery;  Laterality: N/A;  CABG x one, using left greater saphenous vein harvested endoscopically  . INTRAOPERATIVE TRANSESOPHAGEAL ECHOCARDIOGRAM N/A 01/28/2013   Procedure: INTRAOPERATIVE TRANSESOPHAGEAL ECHOCARDIOGRAM;  Surgeon: SMelrose Nakayama MD;  [place]  Service: Open Heart Surgery;  Laterality: N/A;  . LEFT HEART CATHETERIZATION WITH CORONARY ANGIOGRAM N/A  01/06/2013   Procedure: LEFT HEART CATHETERIZATION WITH CORONARY ANGIOGRAM;  Surgeon: Peter M Martinique, MD;  Location: [facility] C[facility] LAB;  Service: Cardiovascular;  Laterality: N/A;  . LEFT HEART CATHETERIZATION WITH CORONARY ANGIOGRAM N/A 01/21/2013   Procedure: LEFT HEART CATHETERIZATION WITH CORONARY ANGIOGRAM;  Surgeon: Burnell Blanks, MD;  Location: [facility] CATH LAB;  Service:  Cardiovascular;  Laterality: N/A;  . LUMBAR LAMINECTOMY/ DECOMPRESSION WITH MET-RX Right 06/30/2018   Procedure: Right minimally invasive Lumbar Three-Four Far lateral discectomy;  Surgeon: Judith Part, MD;  Location: [place];  Service: Neurosurgery;  Laterality: Right;  Right minimally invasive Lumbar Three-Four Far lateral discectomy  . MASS EXCISION Right 07/21/2014   Procedure: EXCISION RIGHT NECK MASS;  Surgeon: Erroll Luna, MD;  Location: [place];  Service: General;  Laterality: Right;  . PERICARDIAL TAP N/A 01/24/2013   Procedure: PERICARDIAL TAP;  Surgeon: Blane Ohara, MD;  Location: [facility] C[facility] LAB;  Service: Cardiovascular;  Laterality: N/A;  . RIGHT HEART CATHETERIZATION Right 01/24/2013   Procedure: RIGHT HEART CATH;  Surgeon: Blane Ohara, MD;  Location: [facility] CATH LAB;  Service: Cardiovascular;  Laterality: Right;  . VENTRICULAR ANEURYSM RESECTION N/A 01/28/2013   Procedure: LEFT VENTRICULAR ANEURYSM REPAIR;  Surgeon: [place] Nakayama, MD;  Location: [place];  Service: Open Heart Surgery;  Laterality: N/A;    ROS- all systems are reviewed and negatives except as per HPI above  Current Outpatient Medications  Medication Sig Dispense Refill  . ALPHAGAN P 0.1 % SOLN INSTILL 1 DROP INTO BOTH EYES TWICE A DAY 10 mL 0  . aspirin 81 MG EC tablet Take 1 tablet (81 mg total) by mouth daily. Restart on 07/05/2018 90 tablet 3  . atorvastatin (LIPITOR) 80 MG tablet TAKE 1 TABLET BY MOUTH EVERY DAY 90 tablet 1  . carvedilol (COREG) 25 MG tablet Take 1 tablet (25 mg total) by mouth 2 (two) times daily with a meal. 180 tablet 3  . Cholecalciferol (VITAMIN D) 2000 units tablet Take 2,000 Units by mouth daily.    Marland Kitchen ENTRESTO 97-103 MG TAKE 1 TABLET BY MOUTH TWICE A DAY 180 tablet 2  . furosemide (LASIX) 20 MG tablet TAKE 1 TABLET BY MOUTH EVERY DAY 2 tablet 0  . gabapentin (NEURONTIN) 300 MG capsule TAKE 1 CAPSULE BY MOUTH EVERYDAY AT BEDTIME 90 capsule 1  .  latanoprost (XALATAN) 0.005 % ophthalmic solution Place 1 drop into both eyes daily.  12  . Multiple Vitamin (MULTIVITAMIN WITH MINERALS) TABS tablet Take 1 tablet by mouth daily.    . nicotine (NICODERM CQ - DOSED IN MG/24 HOURS) 21 mg/24hr patch Place 1 patch (21 mg total) onto the skin daily. 28 patch 1  . nitroGLYCERIN (NITROSTAT) 0.4 MG SL tablet Place 1 tablet (0.4 mg total) under the tongue every 5 (five) minutes as needed for chest pain (up to 3 doses). 25 tablet 0  . spironolactone (ALDACTONE) 25 MG tablet Take 1 tablet (25 mg total) by mouth daily. 90 tablet 3   No current facility-administered medications for this visit.    Physical Exam: Vitals:   04/18/20 1156  BP: 100/72  Pulse: (!) 58  SpO2: 95%  Weight: 263 lb (119.3 kg)  Height: 5' 11"  (1.803 m)    GEN- The patient is overweight appearing, alert and oriented x 3 today.   Head- normocephalic, atraumatic Eyes-  Sclera clear, conjunctiva pink Ears- hearing intact Oropharynx- clear Lungs- Clear to ausculation bilaterally, normal work of breathing Heart- Regular rate  and rhythm, no murmurs, rubs or gallops, PMI not laterally displaced GI- soft, NT, ND, + BS Extremities- no clubbing, cyanosis, or edema  Wt Readings from Last 3 Encounters:  04/18/20 263 lb (119.3 kg)  02/29/20 264 lb 6.4 oz (119.9 kg)  01/25/20 269 lb 6.4 oz (122.2 kg)    EKG tracing ordered today is personally reviewed and shows sinus rhythm 58 bpm, PR 260 msec, QRS 88 msec, inferior MI  Assessment and Plan:  1. Ischemic CM/chronic systolic dysfunction The patient has an ischemic CM (EF 25%), NYHA Class III CHF, and CAD.  He has a h/o LV pseudoaneurysm that ruptured.  He is referred by Dr Martinique for risk stratification of sudden death and consideration of ICD implantation.  He has been treated with an optimal medical therapy.  QRS <120 msec and therefore he does not meet criteria for CRT.   At this time, he meets MADIT II/ SCD-HeFT criteria for ICD  implantation for primary prevention of sudden death.  I have had a thorough discussion with the patient reviewing options.  The patient and their family (if available) have had opportunities to ask questions and have them answered. The patient and I have decided together through a shared decision making process to proceed with ICD at this time.   Risks, benefits, alternatives to ICD implantation were discussed in detail with the patient today. The patient understands that the risks include but are not limited to bleeding, infection, pneumothorax, perforation, tamponade, vascular damage, renal failure, MI, stroke, death, inappropriate shocks, and lead dislodgement and wishes to proceed.  We will therefore schedule device implantation at the next available time. We will plan single chamber ICD at this time.    Risks, benefits and potential toxicities for medications prescribed and/or refilled reviewed with patient today.   Thompson Grayer MD, [facility] 04/18/2020 12:10 PM

## 2020-04-18 NOTE — Patient Instructions (Addendum)
Medication Instructions:  Your physician recommends that you continue on your current medications as directed. Please refer to the Current Medication list given to you today.  *If you need a refill on your cardiac medications before your next appointment, please call your pharmacy*  Lab Work: None ordered.  If you have labs (blood work) drawn today and your tests are completely normal, you will receive your results only by: Marland Kitchen MyChart Message (if you have MyChart) OR . A paper copy in the mail If you have any lab test that is abnormal or we need to change your treatment, we will call you to review the results.  Testing/Procedures: Your physician has recommended that you have a defibrillator inserted. An implantable cardioverter defibrillator (ICD) is a small device that is placed in your chest or, in rare cases, your abdomen. This device uses electrical pulses or shocks to help control life-threatening, irregular heartbeats that could lead the heart to suddenly stop beating (sudden cardiac arrest). Leads are attached to the ICD that goes into your heart. This is done in the hospital and usually requires an overnight stay. Please see the instruction sheet given to you today for more information.     Follow-Up: At [facility], you and your health needs are our priority.  As part of our continuing mission to provide you with exceptional heart care, we have created designated Provider Care Teams.  These Care Teams include your primary Cardiologist (physician) and Advanced Practice Providers (APPs -  Physician Assistants and Nurse Practitioners) who all work together to provide you with the care you need, when you need it.  We recommend signing up for the patient portal called "MyChart".  Sign up information is provided on this After Visit Summary.  MyChart is used to connect with patients for Virtual Visits (Telemedicine).  Patients are able to view lab/test results, encounter notes, upcoming  appointments, etc.  Non-urgent messages can be sent to your provider as well.   To learn more about what you can do with MyChart, go to [email or web address].      Other Instructions:  Cardioverter Defibrillator Implantation  An implantable cardioverter defibrillator (ICD) is a small device that is placed under the skin in the chest or abdomen. An ICD consists of a battery, a small computer (pulse generator), and wires (leads) that go into the heart. An ICD is used to detect and correct two types of dangerous irregular heartbeats (arrhythmias):  A rapid heart rhythm (tachycardia).  An arrhythmia in which the lower chambers of the heart (ventricles) contract in an uncoordinated way (fibrillation). When an ICD detects tachycardia, it sends a low-energy shock to the heart to restore the heartbeat to normal (cardioversion). This signal is usually painless. If cardioversion does not work or if the ICD detects fibrillation, it delivers a high-energy shock to the heart (defibrillation) to restart the heart. This shock may feel like a strong jolt in the chest. Your health care provider may prescribe an ICD if:  You have had an arrhythmia that originated in the ventricles.  Your heart has been damaged by a disease or heart condition. Sometimes, ICDs are programmed to act as a device called a pacemaker. Pacemakers can be used to treat a slow heartbeat (bradycardia) or tachycardia by taking over the heart rate with electrical impulses. Tell a health care provider about:  Any allergies you have.  All medicines you are taking, including vitamins, herbs, eye drops, creams, and over-the-counter medicines.  Any problems you or family members have  had with anesthetic medicines.  Any blood disorders you have.  Any surgeries you have had.  Any medical conditions you have.  Whether you are pregnant or may be pregnant. What are the risks? Generally, this is a safe procedure. However, problems may  occur, including:  Swelling, bleeding, or bruising.  Infection.  Blood clots.  Damage to other structures or organs, such as nerves, blood vessels, or the heart.  Allergic reactions to medicines used during the procedure. What happens before the procedure? Staying hydrated Follow instructions from your health care provider about hydration, which may include:  Up to 2 hours before the procedure - you may continue to drink clear liquids, such as water, clear fruit juice, black coffee, and plain tea. Eating and drinking restrictions Follow instructions from your health care provider about eating and drinking, which may include:  8 hours before the procedure - stop eating heavy meals or foods such as meat, fried foods, or fatty foods.  6 hours before the procedure - stop eating light meals or foods, such as toast or cereal.  6 hours before the procedure - stop drinking milk or drinks that contain milk.  2 hours before the procedure - stop drinking clear liquids. Medicine Ask your health care provider about:  Changing or stopping your normal medicines. This is important if you take diabetes medicines or blood thinners.  Taking medicines such as aspirin and ibuprofen. These medicines can thin your blood. Do not take these medicines before your procedure if your doctor tells you not to. Tests  You may have blood tests.  You may have a test to check the electrical signals in your heart (electrocardiogram, ECG).  You may have imaging tests, such as a chest X-ray. General instructions  For 24 hours before the procedure, stop using products that contain nicotine or tobacco, such as cigarettes and e-cigarettes. If you need help quitting, ask your health care provider.  Plan to have someone take you home from the hospital or clinic.  You may be asked to shower with a germ-killing soap. What happens during the procedure?  To reduce your risk of infection: ? Your health care team  will wash or sanitize their hands. ? Your skin will be washed with soap. ? Hair may be removed from the surgical area.  Small monitors will be put on your body. They will be used to check your heart, blood pressure, and oxygen level.  An IV tube will be inserted into one of your veins.  You will be given one or more of the following: ? A medicine to help you relax (sedative). ? A medicine to numb the area (local anesthetic). ? A medicine to make you fall asleep (general anesthetic).  Leads will be guided through a blood vessel into your heart and attached to your heart muscles. Depending on the ICD, the leads may go into one ventricle or they may go into both ventricles and into an upper chamber of the heart. An X-ray machine (fluoroscope) will be usedto help guide the leads.  A small incision will be made to create a deep pocket under your skin.  The pulse generator will be placed into the pocket.  The ICD will be tested.  The incision will be closed with stitches (sutures), skin glue, or staples.  A bandage (dressing) will be placed over the incision. This procedure may vary among health care providers and hospitals. What happens after the procedure?  Your blood pressure, heart rate, breathing rate, and  blood oxygen level will be monitored often until the medicines you were given have worn off.  A chest X-ray will be taken to check that the ICD is in the right place.  You will need to stay in the hospital for 1-2 days so your health care provider can make sure your ICD is working.  Do not drive for 24 hours if you received a sedative. Ask your health care provider when it is safe for you to drive.  You may be given an identification card explaining that you have an ICD. Summary  An implantable cardioverter defibrillator (ICD) is a small device that is placed under the skin in the chest or abdomen. It is used to detect and correct dangerous irregular heartbeats  (arrhythmias).  An ICD consists of a battery, a small computer (pulse generator), and wires (leads) that go into the heart.  When an ICD detects rapid heart rhythm (tachycardia), it sends a low-energy shock to the heart to restore the heartbeat to normal (cardioversion). If cardioversion does not work or if the ICD detects uncoordinated heart contractions (fibrillation), it delivers a high-energy shock to the heart (defibrillation) to restart the heart.  You will need to stay in the hospital for 1-2 days to make sure your ICD is working. This information is not intended to replace advice given to you by your health care provider. Make sure you discuss any questions you have with your health care provider. Document Revised: 03/15/2017 Document Reviewed: 04/11/2016 Elsevier Patient Education  2020 [employer].

## 2020-04-21 ENCOUNTER — Other Ambulatory Visit: Payer: Self-pay | Admitting: Internal Medicine

## 2020-04-21 DIAGNOSIS — I25810 Atherosclerosis of coronary artery bypass graft(s) without angina pectoris: Secondary | ICD-10-CM

## 2020-04-21 DIAGNOSIS — E785 Hyperlipidemia, unspecified: Secondary | ICD-10-CM

## 2020-04-21 NOTE — Telephone Encounter (Signed)
Medication:  atorvastatin (LIPITOR) 80 MG tablet [[id number]]  spironolactone (ALDACTONE) 25 MG tablet [[id number]]   Has the patient contacted their pharmacy? Yes   (Agent: If yes, when and what did the pharmacy advise?) to call the office   Preferred Pharmacy (with phone number or street name):  [id number] - [place], [place] - [address] AT [address]  [address] [place] [address]  Phone:  [phone] Fax:  [phone]  Agent: Please be advised that RX refills may take up to 3 business days. We ask that you follow-up with your pharmacy.

## 2020-04-22 ENCOUNTER — Other Ambulatory Visit: Payer: Self-pay | Admitting: Cardiology

## 2020-04-22 ENCOUNTER — Other Ambulatory Visit: Payer: Self-pay | Admitting: Internal Medicine

## 2020-04-22 DIAGNOSIS — R6889 Other general symptoms and signs: Secondary | ICD-10-CM | POA: Diagnosis not present

## 2020-04-22 MED ORDER — SPIRONOLACTONE 25 MG PO TABS: 25.0000 mg | ORAL_TABLET | Freq: Every day | ORAL | 3 refills | Status: DC

## 2020-04-22 NOTE — Telephone Encounter (Signed)
Medication: spironolactone (ALDACTONE) 25 MG tablet [[id number]]  ENDED  Has the patient contacted their pharmacy? YES (Agent: If no, request that the patient contact the pharmacy for the refill.) (Agent: If yes, when and what did the pharmacy advise?)  Preferred Pharmacy (with phone number or street name): [id number] - [place], [place]  [address], [place] [address]  Phone:  [phone] Fax:  [phone]   Agent: Please be advised that RX refills may take up to 3 business days. We ask that you follow-up with your pharmacy.

## 2020-04-22 NOTE — Telephone Encounter (Signed)
   Notes to clinic:  medication filled by a different provider Review for refill   Requested Prescriptions  Pending Prescriptions Disp Refills   spironolactone (ALDACTONE) 25 MG tablet 90 tablet 3    Sig: Take 1 tablet (25 mg total) by mouth daily.      Cardiovascular: Diuretics - Aldosterone Antagonist Passed - 04/22/2020 10:15 AM      Passed - Cr in normal range and within 360 days    Creatinine  Date Value Ref Range Status  12/04/2018 139.0 20.0 - 300.0 mg/dL Final   Creat  Date Value Ref Range Status  11/03/2015 0.90 0.70 - 1.33 mg/dL Final    Comment:      For patients > or = 59 years of age: The upper reference limit for Creatinine is approximately 13% higher for people identified as African-American.      Creatinine, Ser  Date Value Ref Range Status  02/29/2020 0.84 0.76 - 1.27 mg/dL Final          Passed - K in normal range and within 360 days    Potassium  Date Value Ref Range Status  02/29/2020 4.2 3.5 - 5.2 mmol/L Final          Passed - Na in normal range and within 360 days    Sodium  Date Value Ref Range Status  02/29/2020 143 134 - 144 mmol/L Final          Passed - Last BP in normal range    BP Readings from Last 1 Encounters:  04/18/20 100/72          Passed - Valid encounter within last 6 months    Recent Outpatient Visits           1 month ago Essential hypertension   [place], [place]   2 months ago Encounter for [employer] annual wellness exam   [place], [place], NP   6 months ago Essential hypertension   [place], [place], NP   9 months ago Ischemic cardiomyopathy   [employer], MD   10 months ago Lumbar radiculopathy   [place], Deborah B, MD       Future Appointments             In 2 months Martinique, Ander Slade, MD  [place] [facility], [facility]

## 2020-04-27 DIAGNOSIS — R6889 Other general symptoms and signs: Secondary | ICD-10-CM | POA: Diagnosis not present

## 2020-05-02 ENCOUNTER — Other Ambulatory Visit: Payer: Medicare HMO

## 2020-05-04 ENCOUNTER — Other Ambulatory Visit: Payer: Self-pay | Admitting: Internal Medicine

## 2020-05-04 ENCOUNTER — Other Ambulatory Visit: Payer: Medicare HMO

## 2020-05-04 ENCOUNTER — Other Ambulatory Visit: Payer: Self-pay | Admitting: Cardiology

## 2020-05-04 DIAGNOSIS — M5416 Radiculopathy, lumbar region: Secondary | ICD-10-CM

## 2020-05-05 ENCOUNTER — Other Ambulatory Visit: Payer: Self-pay | Admitting: Internal Medicine

## 2020-05-05 ENCOUNTER — Other Ambulatory Visit: Payer: Medicare HMO

## 2020-05-05 DIAGNOSIS — M5416 Radiculopathy, lumbar region: Secondary | ICD-10-CM

## 2020-05-05 NOTE — Telephone Encounter (Signed)
Requested medication (s) are due for refill today: Yes  Requested medication (s) are on the active medication list: Yes  Last refill:  05/05/20  Future visit scheduled: Yes  Notes to clinic:  Diagnosis Code needed     Requested Prescriptions  Pending Prescriptions Disp Refills   gabapentin (NEURONTIN) 300 MG capsule 90 capsule 1    Sig: TAKE 1 CAPSULE BY MOUTH EVERYDAY AT BEDTIME      Neurology: Anticonvulsants - gabapentin Passed - 05/05/2020 11:46 AM      Passed - Valid encounter within last 12 months    Recent Outpatient Visits           2 months ago Essential hypertension   [place], [place]   3 months ago Encounter for [employer] annual wellness exam   [place], [place], NP   6 months ago Essential hypertension   [place], [place], NP   10 months ago Ischemic cardiomyopathy   [place] Pier, MD   10 months ago Lumbar radiculopathy   [place] Pier, MD       Future Appointments             In 1 month Martinique, Ander Slade, MD [facility], [facility]              Refused Prescriptions Disp Refills   sacubitril-valsartan (ENTRESTO) 97-103 MG 180 tablet 3    Sig: Take 1 tablet by mouth 2 (two) times daily.      Off-Protocol Failed - 05/05/2020 11:46 AM      Failed - Medication not assigned to a protocol, review manually.      Passed - Valid encounter within last 12 months    Recent Outpatient Visits           2 months ago Essential hypertension   [place], MD   3 months ago Encounter for [employer] annual wellness exam   [place], [place], NP   6 months ago Essential hypertension   [place], [place], NP   10  months ago Ischemic cardiomyopathy   [place], MD   10 months ago Lumbar radiculopathy   [place], Deborah B, MD       Future Appointments             In 1 month Martinique, Ander Slade, MD [place] [facility], [facility]

## 2020-05-05 NOTE — Telephone Encounter (Signed)
Copied from [place] [id number]. Topic: Quick Communication - Rx Refill/Question >> May 05, 2020 10:40 AM Yvette Rack wrote: Medication: ENTRESTO 97-103 MG and gabapentin (NEURONTIN) 300 MG capsule  Has the patient contacted their pharmacy? yes   Preferred Pharmacy (with phone number or street name): [id number] [place], [place] Phone: [phone]   Fax: [phone]  Agent: Please be advised that RX refills may take up to 3 business days. We ask that you follow-up with your pharmacy.

## 2020-05-06 ENCOUNTER — Telehealth: Payer: Self-pay

## 2020-05-06 ENCOUNTER — Other Ambulatory Visit: Payer: Medicare HMO | Admitting: *Deleted

## 2020-05-06 ENCOUNTER — Other Ambulatory Visit: Payer: Self-pay

## 2020-05-06 DIAGNOSIS — R6889 Other general symptoms and signs: Secondary | ICD-10-CM | POA: Diagnosis not present

## 2020-05-06 DIAGNOSIS — I5022 Chronic systolic (congestive) heart failure: Secondary | ICD-10-CM

## 2020-05-06 DIAGNOSIS — I255 Ischemic cardiomyopathy: Secondary | ICD-10-CM | POA: Diagnosis not present

## 2020-05-06 NOTE — Telephone Encounter (Signed)
Reason for CRM: Pt requests call back from Jay[facility]A to discuss an upcoming procedure he has scheduled.  Returned pt call. Pt states he got everything situated.   Pt states he is getting the defibrillator put in on 2/9

## 2020-05-07 LAB — CBC WITH DIFFERENTIAL/PLATELET
Basophils Absolute: 0.1 10*3/uL (ref 0.0–0.2)
Basos: 1 %
EOS (ABSOLUTE): 0.1 10*3/uL (ref 0.0–0.4)
Eos: 1 %
Hematocrit: 44.4 % (ref 37.5–51.0)
Hemoglobin: 15.4 g/dL (ref 13.0–17.7)
Immature Grans (Abs): 0 10*3/uL (ref 0.0–0.1)
Immature Granulocytes: 0 %
Lymphocytes Absolute: 2.9 10*3/uL (ref 0.7–3.1)
Lymphs: 54 %
MCH: 30.1 pg (ref 26.6–33.0)
MCHC: 34.7 g/dL (ref 31.5–35.7)
MCV: 87 fL (ref 79–97)
Monocytes Absolute: 0.6 10*3/uL (ref 0.1–0.9)
Monocytes: 10 %
Neutrophils Absolute: 1.8 10*3/uL (ref 1.4–7.0)
Neutrophils: 34 %
Platelets: 171 10*3/uL (ref 150–450)
RBC: 5.12 x10E6/uL (ref 4.14–5.80)
RDW: 12.6 % (ref 11.6–15.4)
WBC: 5.4 10*3/uL (ref 3.4–10.8)

## 2020-05-07 LAB — BASIC METABOLIC PANEL
BUN/Creatinine Ratio: 10 (ref 9–20)
BUN: 10 mg/dL (ref 6–24)
CO2: 24 mmol/L (ref 20–29)
Calcium: 9.3 mg/dL (ref 8.7–10.2)
Chloride: 109 mmol/L — ABNORMAL HIGH (ref 96–106)
Creatinine, Ser: 1.03 mg/dL (ref 0.76–1.27)
GFR calc Af Amer: 92 mL/min/{1.73_m2} (ref >59)
GFR calc non Af Amer: 80 mL/min/{1.73_m2} (ref >59)
Glucose: 91 mg/dL (ref 65–99)
Potassium: 4.6 mmol/L (ref 3.5–5.2)
Sodium: 145 mmol/L — ABNORMAL HIGH (ref 134–144)

## 2020-05-07 MED ORDER — GABAPENTIN 300 MG PO CAPS: ORAL_CAPSULE | ORAL | 1 refills | Status: DC

## 2020-05-12 ENCOUNTER — Telehealth: Payer: Self-pay | Admitting: Internal Medicine

## 2020-05-12 DIAGNOSIS — R6889 Other general symptoms and signs: Secondary | ICD-10-CM | POA: Diagnosis not present

## 2020-05-12 NOTE — Telephone Encounter (Signed)
Pt called in and would like to check with Dr Rayann Heman if it would be safe for him to use Vibration plate.  Fitness vibration plate. It is type of workout equipment.       Best number [phone]

## 2020-05-13 DIAGNOSIS — R6889 Other general symptoms and signs: Secondary | ICD-10-CM | POA: Diagnosis not present

## 2020-05-17 NOTE — Telephone Encounter (Signed)
Please check with tech services for [employer] and let patient know.

## 2020-05-23 ENCOUNTER — Other Ambulatory Visit (HOSPITAL_COMMUNITY)
Admission: RE | Admit: 2020-05-23 | Discharge: 2020-05-23 | Disposition: A | Discharge status: Home / Self Care | Payer: Medicare HMO | Source: Ambulatory Visit | Attending: Internal Medicine | Admitting: Internal Medicine

## 2020-05-23 DIAGNOSIS — Z01812 Encounter for preprocedural laboratory examination: Secondary | ICD-10-CM | POA: Insufficient documentation

## 2020-05-23 DIAGNOSIS — Z20822 Contact with and (suspected) exposure to covid-19: Secondary | ICD-10-CM | POA: Insufficient documentation

## 2020-05-24 LAB — SARS CORONAVIRUS 2 (TAT 6-24 HRS): SARS Coronavirus 2: NEGATIVE

## 2020-05-24 NOTE — Progress Notes (Signed)
Instructed patient on the following items: Arrival time 0630 Nothing to eat or drink after midnight No meds AM of procedure Responsible person to drive you home and stay with you for 24 hrs Wash with special soap night before and morning of procedure  

## 2020-05-25 ENCOUNTER — Ambulatory Visit (HOSPITAL_COMMUNITY)
Admission: RE | Admit: 2020-05-25 | Discharge: 2020-05-25 | Disposition: A | Discharge status: Home / Self Care | Payer: Medicare HMO | Attending: Internal Medicine | Admitting: Internal Medicine

## 2020-05-25 ENCOUNTER — Ambulatory Visit (HOSPITAL_COMMUNITY)
Admission: RE | Disposition: A | Discharge status: Home / Self Care | Payer: Medicare HMO | Source: Home / Self Care | Attending: Internal Medicine

## 2020-05-25 ENCOUNTER — Encounter (HOSPITAL_COMMUNITY): Payer: Self-pay | Admitting: Internal Medicine

## 2020-05-25 ENCOUNTER — Ambulatory Visit (HOSPITAL_COMMUNITY): Payer: Medicare HMO

## 2020-05-25 ENCOUNTER — Other Ambulatory Visit: Payer: Self-pay

## 2020-05-25 ENCOUNTER — Telehealth: Payer: Self-pay | Admitting: Internal Medicine

## 2020-05-25 DIAGNOSIS — Z95810 Presence of automatic (implantable) cardiac defibrillator: Secondary | ICD-10-CM

## 2020-05-25 DIAGNOSIS — I255 Ischemic cardiomyopathy: Secondary | ICD-10-CM | POA: Insufficient documentation

## 2020-05-25 DIAGNOSIS — Z950 Presence of cardiac pacemaker: Secondary | ICD-10-CM | POA: Diagnosis not present

## 2020-05-25 DIAGNOSIS — Z79899 Other long term (current) drug therapy: Secondary | ICD-10-CM | POA: Diagnosis not present

## 2020-05-25 DIAGNOSIS — I2510 Atherosclerotic heart disease of native coronary artery without angina pectoris: Secondary | ICD-10-CM | POA: Diagnosis not present

## 2020-05-25 DIAGNOSIS — I509 Heart failure, unspecified: Secondary | ICD-10-CM | POA: Diagnosis not present

## 2020-05-25 DIAGNOSIS — Z7982 Long term (current) use of aspirin: Secondary | ICD-10-CM | POA: Diagnosis not present

## 2020-05-25 HISTORY — PX: ICD IMPLANT: EP1208

## 2020-05-25 SURGERY — ICD IMPLANT

## 2020-05-25 MED ORDER — LIDOCAINE HCL (PF) 1 % IJ SOLN
INTRAMUSCULAR | Status: DC | PRN
  Administered 2020-05-25: 60 mL

## 2020-05-25 MED ORDER — SODIUM CHLORIDE 0.9% FLUSH: 3.0000 mL | INTRAVENOUS | Status: DC | PRN

## 2020-05-25 MED ORDER — MIDAZOLAM HCL 5 MG/5ML IJ SOLN
INTRAMUSCULAR | Status: AC
  Filled 2020-05-25: qty 5

## 2020-05-25 MED ORDER — SODIUM CHLORIDE 0.9% FLUSH: 3.0000 mL | Freq: Two times a day (BID) | INTRAVENOUS | Status: DC

## 2020-05-25 MED ORDER — CEFAZOLIN SODIUM-DEXTROSE 2-4 GM/100ML-% IV SOLN
INTRAVENOUS | Status: AC
  Filled 2020-05-25: qty 100

## 2020-05-25 MED ORDER — HEPARIN (PORCINE) IN NACL 1000-0.9 UT/500ML-% IV SOLN
INTRAVENOUS | Status: DC | PRN
  Administered 2020-05-25: 500 mL

## 2020-05-25 MED ORDER — POVIDONE-IODINE 10 % EX SWAB
2.0000 "application " | Freq: Once | CUTANEOUS | Status: AC
  Administered 2020-05-25: 2 via TOPICAL

## 2020-05-25 MED ORDER — SODIUM CHLORIDE 0.9 % IV SOLN: 250.0000 mL | INTRAVENOUS | Status: DC | PRN

## 2020-05-25 MED ORDER — SODIUM CHLORIDE 0.9 % IV SOLN: INTRAVENOUS | Status: DC

## 2020-05-25 MED ORDER — MIDAZOLAM HCL 5 MG/5ML IJ SOLN
INTRAMUSCULAR | Status: DC | PRN
  Administered 2020-05-25 (×2): 1 mg via INTRAVENOUS

## 2020-05-25 MED ORDER — SODIUM CHLORIDE 0.9 % IV SOLN
80.0000 mg | INTRAVENOUS | Status: AC
  Administered 2020-05-25: 80 mg
  Filled 2020-05-25: qty 2

## 2020-05-25 MED ORDER — ACETAMINOPHEN 325 MG PO TABS
325.0000 mg | ORAL_TABLET | ORAL | Status: DC | PRN
  Filled 2020-05-25: qty 2

## 2020-05-25 MED ORDER — FENTANYL CITRATE (PF) 100 MCG/2ML IJ SOLN
INTRAMUSCULAR | Status: AC
  Filled 2020-05-25: qty 2

## 2020-05-25 MED ORDER — LIDOCAINE HCL 1 % IJ SOLN
INTRAMUSCULAR | Status: AC
  Filled 2020-05-25: qty 60

## 2020-05-25 MED ORDER — CHLORHEXIDINE GLUCONATE 4 % EX LIQD: 4.0000 "application " | Freq: Once | CUTANEOUS | Status: DC

## 2020-05-25 MED ORDER — SODIUM CHLORIDE 0.9 % IV SOLN
INTRAVENOUS | Status: AC
  Filled 2020-05-25: qty 2

## 2020-05-25 MED ORDER — IOHEXOL 350 MG/ML SOLN
INTRAVENOUS | Status: DC | PRN
  Administered 2020-05-25: 20 mL via INTRAVENOUS

## 2020-05-25 MED ORDER — CEFAZOLIN SODIUM-DEXTROSE 2-4 GM/100ML-% IV SOLN
2.0000 g | INTRAVENOUS | Status: AC
  Administered 2020-05-25: 2 g via INTRAVENOUS

## 2020-05-25 MED ORDER — FENTANYL CITRATE (PF) 100 MCG/2ML IJ SOLN
INTRAMUSCULAR | Status: DC | PRN
  Administered 2020-05-25: 25 ug via INTRAVENOUS

## 2020-05-25 MED ORDER — ONDANSETRON HCL 4 MG/2ML IJ SOLN: 4.0000 mg | Freq: Four times a day (QID) | INTRAMUSCULAR | Status: DC | PRN

## 2020-05-25 MED ORDER — HEPARIN (PORCINE) IN NACL 1000-0.9 UT/500ML-% IV SOLN
INTRAVENOUS | Status: AC
  Filled 2020-05-25: qty 500

## 2020-05-25 SURGICAL SUPPLY — 7 items
CABLE SURGICAL S-101-97-12 (CABLE) ×2 IMPLANT
ICD GALLANT VR CDVRA500Q (ICD Generator) ×1 IMPLANT
LEAD DURATA 7122Q-65CM (Lead) ×1 IMPLANT
MAT PREVALON FULL STRYKER (MISCELLANEOUS) ×1 IMPLANT
PAD PRO RADIOLUCENT 2001M-C (PAD) ×2 IMPLANT
SHEATH 7FR PRELUDE SNAP 13 (SHEATH) ×1 IMPLANT
TRAY PACEMAKER INSERTION (PACKS) ×2 IMPLANT

## 2020-05-25 NOTE — Discharge Instructions (Signed)
After Your Pacemaker    You have a [employer] Pacemaker  ACTIVITY  Do not lift your arm above shoulder height for 1 week after your procedure. After 7 days, you may progress as below.   You should remove your sling 24 hours after your procedure, unless otherwise instructed by your provider.     Wednesday June 01, 2020  Thursday June 02, 2020 Friday June 03, 2020 Saturday June 04, 2020    Do not lift, push, pull, or carry anything over 10 pounds with the affected arm until 6 weeks (Wednesday July 06, 2020 ) after your procedure.    Do NOT DRIVE until you have been seen for your wound check, or as long as instructed by your healthcare provider.    Ask your healthcare provider when you can go back to work   INCISION/Dressing  If you are on a blood thinner such as Coumadin, Xarelto, Eliquis, Plavix, or Pradaxa please confirm with your provider when this should be resumed.    If large square, outer bandage is left in place, this can be removed after 24 hours from your procedure. Do not remove steri-strips or glue as below.    Monitor your Pacemaker site for redness, swelling, and drainage. Call the device clinic at [phone] if you experience these symptoms or fever/chills.   If your incision is sealed with Steri-strips or staples, you may shower 10 days after your procedure or when told by your provider. Do not remove the steri-strips or let the shower hit directly on your site. You may wash around your site with soap and water.     Avoid lotions, ointments, or perfumes over your incision until it is well-healed.   You may use a hot tub or a pool AFTER your wound check appointment if the incision is completely closed.   PAcemaker Alerts:  Some alerts are vibratory and others beep. These are NOT emergencies. Please call our office to let us know. If this occurs at night or on weekends, it can wait until the next business day. Send a remote  transmission.   If your device is capable of reading fluid status (for heart failure), you will be offered monthly monitoring to review this with you.   DEVICE MANAGEMENT  Remote monitoring is used to monitor your pacemaker from home. This monitoring is scheduled every 91 days by our office. It allows [place] to keep an eye on the functioning of your device to ensure it is working properly. You will routinely see your Electrophysiologist annually (more often if necessary).    You should receive your ID card for your new device in 4-8 weeks. Keep this card with you at all times once received. Consider wearing a medical alert bracelet or necklace.   Your Pacemaker may be MRI compatible. This will be discussed at your next office visit/wound check.  You should avoid contact with strong electric or magnetic fields.    Do not use amateur (ham) radio equipment or electric (arc) welding torches. MP3 player headphones with magnets should not be used. Some devices are safe to use if held at least 12 inches (30 cm) from your Pacemaker. These include power tools, lawn mowers, and speakers. If you are unsure if something is safe to use, ask your health care provider.   When using your cell phone, hold it to the ear that is on the opposite side from the Pacemaker. Do not leave your cell phone in a pocket over the Pacemaker.  You may safely use electric blankets, heating pads, computers, and microwave ovens.  Call the office right away if:  You have chest pain.  You feel more short of breath than you have felt before.  You feel more light-headed than you have felt before.  Your incision starts to open up.  This information is not intended to replace advice given to you by your health care provider. Make sure you discuss any questions you have with your health care provider.

## 2020-05-25 NOTE — Telephone Encounter (Signed)
Patient notified that vibration plate will be ok to use with device. Patient education done on wound care, monitor APP.

## 2020-05-25 NOTE — Progress Notes (Signed)
Dr Rayann Heman in to see pt states ok for pt to be d/c states CXR ok

## 2020-05-25 NOTE — H&P (Signed)
PCP: Ladell Pier, MD Primary Cardiologist: Dr Martinique Primary EP: Dr Aileen Pilot is a 60 y.o. male who presents today for ICD implantation.    Since last being seen in our clinic, the patient reports doing very well.  Today, he denies symptoms of palpitations, chest pain, shortness of breath,  lower extremity edema, dizziness, presyncope, or syncope.  The patient is otherwise without complaint today.       Past Medical History:  Diagnosis Date  . Abnormal EKG    hx of ischemia showing up on ekg's  . Acute myopericarditis    a. 03/2013: readm with hypoxia, tachycardia, elevated ESR/CRP, elevated troponin with 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and myopericarditis; treated with steroids.  . Acute respiratory failure ([place])    a. 01/2013: VDRF. b. 03/2013: hypoxia requiring supp O2 during adm, resolved by discharge.  . C. difficile colitis    a. 01/2013 during prolonged adm. b. Recurred 03/2013.  . Cardiac tamponade    a. 01/2013 s/p drain.  . Cataracts, bilateral   . Coronary artery disease    a. s/p MI in 2009 in [place] with stenting of the LCX and LAD;  b. 12/2012 NSTEMI/CAD: LM nl, LAD patent prox stent, LCX 50-70 isr (FFR 0.84), RCA dom, 136m EF 40-45%-->Med Rx. c. 01/2013: anterolateral STEMI complicated by pericardial effusion (presumed purulent pericarditis) and tamponade s/p drain, ruptured LV pseudoaneurysm s/p CorMatrix patch, CABGx1 (SVG-OM1), fever, CVA, VDRF, C diff.  . CVA (cerebral infarction)    a. 01/2013 in setting of prolonged hospitalization - residual L arm weakness.  . 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([place]    a. 03/2013: readm with hypoxia, tachycardia, elevated ESR/CRP, elevated troponin with Dressler syndrome and myopericarditis; treated with steroids.  . Glaucoma   . Hyperlipidemia   . Hypokalemia   . Hyponatremia    a. During late 2014.  . Ischemic cardiomyopathy    a. Sept/Oct 2014: EF ~40% (ICM). b. 03/2013: EF 25-30%. Off ACEI due to hypotension  (MIXED NICM/ICM).  .Marland KitchenOptic neuropathy, ischemic   . Pericardial effusion    a. 01/2013: pericardial effusion (presumed purulent pericarditis) and cardiac tamponade s/p drain. b. Persistent moderate pericardial effusion 03/2013.  . Pre-diabetes   . Pseudoaneurysm of left ventricle of heart    a. 01/2013:  Ruptured inferoposterior LV pseudoaneurysm s/p CorMatrix patch.  . Sinus bradycardia    asymptomatic  . Stroke ([facility] 2009   weak lt side-lt arm  . Tobacco abuse   . Wears dentures    top        Past Surgical History:  Procedure Laterality Date  . CARDIAC CATHETERIZATION  01/06/2013  . COLONOSCOPY WITH PROPOFOL N/A 07/03/2016   Procedure: COLONOSCOPY WITH PROPOFOL;  Surgeon: SManus Gunning MD;  Location: [facility];  Service: Gastroenterology;  Laterality: N/A;  . CORONARY ANGIOPLASTY WITH STENT PLACEMENT  2000's X 2   "1 + 1" (01/06/2013)  . CORONARY ARTERY BYPASS GRAFT N/A 01/28/2013   Procedure: CORONARY ARTERY BYPASS GRAFTING (CABG);  Surgeon: SMelrose Nakayama MD;  [place] [facility]  Service: Open Heart Surgery;  Laterality: N/A;  CABG x one, using left greater saphenous vein harvested endoscopically  . INTRAOPERATIVE TRANSESOPHAGEAL ECHOCARDIOGRAM N/A 01/28/2013   Procedure: INTRAOPERATIVE TRANSESOPHAGEAL ECHOCARDIOGRAM;  Surgeon: SMelrose Nakayama MD;  [place] [facility]  Service: Open Heart Surgery;  Laterality: N/A;  . LEFT HEART CATHETERIZATION WITH CORONARY ANGIOGRAM N/A 01/06/2013   Procedure: LEFT HEART CATHETERIZATION WITH CORONARY ANGIOGRAM;  Surgeon: Peter M JMartinique MD;  [facility];  Service: Cardiovascular;  Laterality: N/A;  . LEFT HEART CATHETERIZATION WITH CORONARY ANGIOGRAM N/A 01/21/2013   Procedure: LEFT HEART CATHETERIZATION WITH CORONARY ANGIOGRAM;  Surgeon: Burnell Blanks, MD;  Location: [facility] CATH LAB;  Service: Cardiovascular;  Laterality: N/A;  . LUMBAR LAMINECTOMY/ DECOMPRESSION WITH MET-RX Right 06/30/2018    Procedure: Right minimally invasive Lumbar Three-Four Far lateral discectomy;  Surgeon: Judith Part, MD;  Location: [place];  Service: Neurosurgery;  Laterality: Right;  Right minimally invasive Lumbar Three-Four Far lateral discectomy  . MASS EXCISION Right 07/21/2014   Procedure: EXCISION RIGHT NECK MASS;  Surgeon: Erroll Luna, MD;  Location: [place];  Service: General;  Laterality: Right;  . PERICARDIAL TAP N/A 01/24/2013   Procedure: PERICARDIAL TAP;  Surgeon: Blane Ohara, MD;  Location: [facility] CATH LAB;  Service: Cardiovascular;  Laterality: N/A;  . RIGHT HEART CATHETERIZATION Right 01/24/2013   Procedure: RIGHT HEART CATH;  Surgeon: Blane Ohara, MD;  Location: [facility] CATH LAB;  Service: Cardiovascular;  Laterality: Right;  . VENTRICULAR ANEURYSM RESECTION N/A 01/28/2013   Procedure: LEFT VENTRICULAR ANEURYSM REPAIR;  Surgeon: Melrose Nakayama, MD;  Location: [place];  Service: Open Heart Surgery;  Laterality: N/A;    ROS- all systems are reviewed and negatives except as per HPI above        Current Outpatient Medications  Medication Sig Dispense Refill  . ALPHAGAN P 0.1 % SOLN INSTILL 1 DROP INTO BOTH EYES TWICE A DAY 10 mL 0  . aspirin 81 MG EC tablet Take 1 tablet (81 mg total) by mouth daily. Restart on 07/05/2018 90 tablet 3  . atorvastatin (LIPITOR) 80 MG tablet TAKE 1 TABLET BY MOUTH EVERY DAY 90 tablet 1  . carvedilol (COREG) 25 MG tablet Take 1 tablet (25 mg total) by mouth 2 (two) times daily with a meal. 180 tablet 3  . Cholecalciferol (VITAMIN D) 2000 units tablet Take 2,000 Units by mouth daily.    Marland Kitchen ENTRESTO 97-103 MG TAKE 1 TABLET BY MOUTH TWICE A DAY 180 tablet 2  . furosemide (LASIX) 20 MG tablet TAKE 1 TABLET BY MOUTH EVERY DAY 2 tablet 0  . gabapentin (NEURONTIN) 300 MG capsule TAKE 1 CAPSULE BY MOUTH EVERYDAY AT BEDTIME 90 capsule 1  . latanoprost (XALATAN) 0.005 % ophthalmic solution Place 1 drop into both eyes daily.  12  .  Multiple Vitamin (MULTIVITAMIN WITH MINERALS) TABS tablet Take 1 tablet by mouth daily.    . nicotine (NICODERM CQ - DOSED IN MG/24 HOURS) 21 mg/24hr patch Place 1 patch (21 mg total) onto the skin daily. 28 patch 1  . nitroGLYCERIN (NITROSTAT) 0.4 MG SL tablet Place 1 tablet (0.4 mg total) under the tongue every 5 (five) minutes as needed for chest pain (up to 3 doses). 25 tablet 0  . spironolactone (ALDACTONE) 25 MG tablet Take 1 tablet (25 mg total) by mouth daily. 90 tablet 3   No current facility-administered medications for this visit.    Physical Exam: Vitals:   05/25/20 0636  BP: 111/89  Pulse: 72  Temp: 99.1 F (37.3 C)  SpO2: 98%    GEN- The patient is overweight appearing, alert and oriented x 3 today.   Head- normocephalic, atraumatic Eyes-  Sclera clear, conjunctiva pink Ears- hearing intact Oropharynx- clear Lungs- Clear to ausculation bilaterally, normal work of breathing Heart- Regular rate and rhythm, no murmurs, rubs or gallops, PMI not laterally displaced GI- soft, NT, ND, + BS Extremities- no clubbing,  cyanosis, or edema     Wt Readings from Last 3 Encounters:  04/18/20 263 lb (119.3 kg)  02/29/20 264 lb 6.4 oz (119.9 kg)  01/25/20 269 lb 6.4 oz (122.2 kg)    EKG tracing ordered today is personally reviewed and shows sinus rhythm 58 bpm, PR 260 msec, QRS 88 msec, inferior MI  Assessment and Plan:  1. Ischemic CM/chronic systolic dysfunction The patient has an ischemic CM (EF 25%), NYHA Class III CHF, and CAD.  He has a h/o LV pseudoaneurysm that ruptured.  He is referred by Dr Martinique for risk stratification of sudden death and consideration of ICD implantation.  He has been treated with an optimal medical therapy.  QRS <120 msec and therefore he does not meet criteria for CRT.      ICD Criteria  Current LVEF:25%. Within 12 months prior to implant: Yes   Heart failure history: Yes, Class III  Cardiomyopathy history: Yes, Ischemic  Cardiomyopathy - Prior MI.  Atrial Fibrillation/Atrial Flutter: No.  Ventricular tachycardia history: No.  Cardiac arrest history: No.  History of syndromes with risk of sudden death: No.  Previous ICD: No.  Current ICD indication: Primary  PPM indication: No.  Class I or II Bradycardia indication present: No  Beta Blocker therapy for 3 or more months: Yes, prescribed.   Ace Inhibitor/ARB therapy for 3 or more months: Yes, prescribed.   I have had a thorough discussion with the patient reviewing options.  The patient has again had opportunities to ask questions and have them answered. The patient and I have decided together through the [place] Support Tool to proceed with ICD at this time.  Risks, benefits, alternatives to ICD implantation were discussed in detail with the patient today. The patient  understands that the risks include but are not limited to bleeding, infection, pneumothorax, perforation, tamponade, vascular damage, renal failure, MI, stroke, death, inappropriate shocks, and lead dislodgement and  wishes to proceed.    Thompson Grayer MD, [place] 05/25/2020 8:28 AM

## 2020-05-25 NOTE — Telephone Encounter (Signed)
Per Gerald Stabs at [facility] a vibration plate will not interfere with device function.

## 2020-05-25 NOTE — Telephone Encounter (Signed)
New message:   Sheppard Coil said he will need Dr Joylene Grapes or his APP to call today please.  They need to do a peer to peer review on pt's Defibrillator.

## 2020-05-25 NOTE — Progress Notes (Signed)
Discharge instructions reviewed with pt. Voices understanding. Discharge instructions reviewed with pt wife by Sharyn Lull, RN

## 2020-05-26 ENCOUNTER — Telehealth: Payer: Self-pay | Admitting: Cardiology

## 2020-05-26 ENCOUNTER — Telehealth: Payer: Self-pay | Admitting: Internal Medicine

## 2020-05-26 MED FILL — Lidocaine HCl Local Inj 1%: INTRAMUSCULAR | Qty: 60 | Status: AC

## 2020-05-26 NOTE — Telephone Encounter (Signed)
Follow Up:     Returning Cheryl's call from yesterday.

## 2020-05-26 NOTE — Telephone Encounter (Signed)
Patient states he had his defibrillator put in yesterday would like to know when he can continue his medications. He also states he was told he could remove the patch after 1:00pm and if the small bandages come off it won't be a bad thing.

## 2020-05-26 NOTE — Telephone Encounter (Signed)
Spoke with patient. Large square guaze has been removed. Steri-strips remain in place, clean and dry. He understands NOT to remove these.   He verbalizes understanding that no changes were made to his medication.  He asks if he can sleep on his left/device side.  He should sleep on his back, or his right side with pillows behind him so he doesn't roll onto his left side.  He may also choose to sleep in a recliner.   His device monitor is via phone, and it is communicating appropriately confirmed by [email or web address].[email or web address]     Pt has no further questions at this time.   [name] [address] [place], [place]  05/26/2020 3:39 PM

## 2020-05-26 NOTE — Telephone Encounter (Signed)
Give paper to Dr. Rayann Heman with information to call back

## 2020-05-27 NOTE — Telephone Encounter (Signed)
Spoke to patient he stated he wanted Dr.Jordan to know he had a defibrillator implant this past Wed 2/9 by Dr.Allred.Stated he was doing good, slight soreness at site.He will keep appointment already scheduled with Dr.Jordan 3/11 at 3:00 pm.Advised I will make Dr.Jordan aware.

## 2020-05-29 NOTE — Telephone Encounter (Signed)
Caren Griffins,  I received this message AFTER his case was performed.  I am not sure if I should pursue or not.  What do you advise?  Thanks! Jeneen Rinks

## 2020-06-07 ENCOUNTER — Ambulatory Visit (INDEPENDENT_AMBULATORY_CARE_PROVIDER_SITE_OTHER): Payer: Medicare HMO | Admitting: Emergency Medicine

## 2020-06-07 ENCOUNTER — Other Ambulatory Visit: Payer: Self-pay

## 2020-06-07 ENCOUNTER — Ambulatory Visit
Admission: RE | Admit: 2020-06-07 | Discharge: 2020-06-07 | Disposition: A | Discharge status: Home / Self Care | Payer: Medicare HMO | Source: Ambulatory Visit | Attending: Internal Medicine | Admitting: Internal Medicine

## 2020-06-07 DIAGNOSIS — T82120A Displacement of cardiac electrode, initial encounter: Secondary | ICD-10-CM

## 2020-06-07 DIAGNOSIS — R0602 Shortness of breath: Secondary | ICD-10-CM | POA: Diagnosis not present

## 2020-06-07 DIAGNOSIS — I255 Ischemic cardiomyopathy: Secondary | ICD-10-CM

## 2020-06-07 DIAGNOSIS — R6889 Other general symptoms and signs: Secondary | ICD-10-CM | POA: Diagnosis not present

## 2020-06-07 LAB — CUP PACEART INCLINIC DEVICE CHECK
Battery Remaining Longevity: 109 mo
Brady Statistic RV Percent Paced: 0.05 %
Date Time Interrogation Session: 20220222155600
HighPow Impedance: 52.875
Implantable Lead Implant Date: 20220209
Implantable Lead Location: 753860
Implantable Pulse Generator Implant Date: 20220209
Lead Channel Impedance Value: 387.5 Ohm
Lead Channel Pacing Threshold Amplitude: 1.75 V
Lead Channel Pacing Threshold Pulse Width: 1 ms
Lead Channel Sensing Intrinsic Amplitude: 6.7 mV
Lead Channel Setting Pacing Amplitude: 3.5 V
Lead Channel Setting Pacing Pulse Width: 0.5 ms
Lead Channel Setting Sensing Sensitivity: 0.5 mV
Pulse Gen Serial Number: 111033765

## 2020-06-07 NOTE — Patient Instructions (Addendum)
Go to [facility] for chest x-ray. Remove outer dressing tomorrow afternoon,. Keep steri-strips clean and dry. Apply ice pack to wound site 20 minutes at a time 3-4 times a day. Call the office if you have any increased swelling , bleeding, drainage or a fever or chills.

## 2020-06-07 NOTE — Progress Notes (Signed)
Wound check appointment. Steri-strips removed. Wound without redness. Edema present at wound site, scant bleeding from medial aspect of wound site. Incision edges approximated with exception of medial area of 1-2 mm. Normal device function. Threshold increased to 2 V at 0.5 ms from 0.5 V at 0.5 mS at implant, sensing decreased from implant from > 12 to 6.8 mV today. Impedances consistent with implant measurements. Dr Rayann Heman aware of change, steri strips reapplied to medial aspect of wound and cx x-ray ordered per Dr Rayann Heman. Device programmed at 3.5V for extra safety margin until 3 month visit. Histogram distribution appropriate for patient and level of activity. No ventricular arrhythmias noted. Patient educated about wound care, arm mobility, lifting restrictions, shock plan. Enrolled in remote follow-up and next remote 08/24/20. Follow -up in device clinic 05/18/20 for wound check and with Dr. Rayann Heman 08/26/20. Patient Education done on s/sx of infection and applying ice to decrease edema.

## 2020-06-08 ENCOUNTER — Telehealth: Payer: Self-pay | Admitting: Internal Medicine

## 2020-06-08 ENCOUNTER — Ambulatory Visit (INDEPENDENT_AMBULATORY_CARE_PROVIDER_SITE_OTHER): Payer: Medicare HMO | Admitting: Emergency Medicine

## 2020-06-08 ENCOUNTER — Other Ambulatory Visit: Payer: Self-pay

## 2020-06-08 DIAGNOSIS — I429 Cardiomyopathy, unspecified: Secondary | ICD-10-CM

## 2020-06-08 NOTE — Progress Notes (Signed)
Pt in office for wound recheck, seen yesterday for wound check, steri-strips were removed and reapplied due to open area.  Pt called this AM reporting what appears to be active bleeding from site.  Current dressing removed, noted moist dark blood pooling in bottom of bandage.  After dressing was removed, site overall was dry.  Applied Benzoin and 6 new steri strips over existing steri strips.  Site remained dry.  Pt requested dressing over site for now.  Applied 2x2 gauze and tegaderm over site with instructions to remove tomorrow and observed only reapplying if still bleeding.  Pt tolerated well and spouse v/u of instructions.

## 2020-06-08 NOTE — Telephone Encounter (Signed)
Spoke with pt.  He reports the new steri-strips applied to incision yesterday are completely saturated with blood that is still wet, indicating possible active bleeding.  Pt requests to be seen/ may need new strips applied.  Scheduled pt for 12:00 today.

## 2020-06-08 NOTE — Telephone Encounter (Signed)
    Pt said the new bandages put in yesterday and today the bandages is saturated with blood, he would like to go to the office today to get it check. Also, pt is following up with his x-ray, if Dr. Rayann Heman got the result yet

## 2020-06-14 DIAGNOSIS — R6889 Other general symptoms and signs: Secondary | ICD-10-CM | POA: Diagnosis not present

## 2020-06-14 DIAGNOSIS — H401132 Primary open-angle glaucoma, bilateral, moderate stage: Secondary | ICD-10-CM | POA: Diagnosis not present

## 2020-06-15 ENCOUNTER — Other Ambulatory Visit: Payer: Self-pay

## 2020-06-15 ENCOUNTER — Ambulatory Visit (INDEPENDENT_AMBULATORY_CARE_PROVIDER_SITE_OTHER): Payer: Medicare HMO | Admitting: Emergency Medicine

## 2020-06-15 DIAGNOSIS — R6889 Other general symptoms and signs: Secondary | ICD-10-CM | POA: Diagnosis not present

## 2020-06-15 DIAGNOSIS — I255 Ischemic cardiomyopathy: Secondary | ICD-10-CM

## 2020-06-15 NOTE — Progress Notes (Signed)
Wound recheck. Steri-strips removed , wound edges approximated. No drainage , decreased edema from previous visit, no redness. Picture taken with patient permission. Education done on s/sx of infection and follow-up.

## 2020-06-15 NOTE — Patient Instructions (Signed)
Call the office if you have increased swelling, any drainage or bleeding  or if area becomes hot to touch. If you develop a fever or chills call the office or seek treatment at an urgent care center or emergency room if it is after hours on the weekend.

## 2020-06-17 ENCOUNTER — Other Ambulatory Visit: Payer: Self-pay | Admitting: Cardiology

## 2020-06-17 DIAGNOSIS — I10 Essential (primary) hypertension: Secondary | ICD-10-CM

## 2020-06-19 ENCOUNTER — Other Ambulatory Visit: Payer: Self-pay | Admitting: Cardiology

## 2020-06-19 DIAGNOSIS — I10 Essential (primary) hypertension: Secondary | ICD-10-CM

## 2020-06-19 MED ORDER — CARVEDILOL 25 MG PO TABS: 25.0000 mg | ORAL_TABLET | Freq: Two times a day (BID) | ORAL | 3 refills | Status: DC

## 2020-06-20 ENCOUNTER — Telehealth: Payer: Self-pay | Admitting: Cardiology

## 2020-06-20 NOTE — Progress Notes (Signed)
Date:  06/24/2020   ID:  [name], DOB [birth year], MRN [id number]  PCP:  Ladell Pier, MD  Cardiologist:  Samarion Ehle [place], MD  Electrophysiologist:  None   Evaluation Performed:  Follow-Up Visit  Chief Complaint:  CHF/CAD  History of Present Illness:    [name] is a 60 y.o. male with  PMH of hyperlipidemia,  CVA, prediabetes, CAD, ischemic cardiomyopathy, history of cardiac tamponade and  pericarditis.  He had stents to the left circumflex and LAD in 2007.  In September 2014, he presented with chest pain.  Repeat cardiac catheterization showed totally occluded RCA and a 50% in-stent restenosis of left circumflex stent.  Medical therapy was recommended.  He was admitted in October 2014 with a large pericardial effusion with tamponade due to ruptured LV pseudoaneurysm.  Pericardial drain was placed.  Prior to discharge, he developed signs of stroke with left arm weakness and left visual field deficit.  He then developed hemodynamic collapse and  required intubation.  Repeat echocardiogram showed complex pericardial effusion with right ventricular collapse and a tamponade.  He underwent emergent median sternotomy and repair of ruptured inferoseptal posterior LV pseudoaneurysm with a core matrix patch and CABG x1 with SVG to OM by Dr. Roxan Hockey.  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  His EF by echocardiogram at the time was 25% with moderate pericardial effusion.    Echocardiogram by August 2015 showed EF of 30%.  Echocardiogram in February 2017 showed EF of 30 to 35%.    He was seen by Almyra Deforest PA-C in March 2020 for pre op clearance for lumbar surgery.   he did undergo L3-4 discectomy without complication. Last repeat Echo in March showed persistently low EF 20-25%. Medical therapy optimized including Entresto, Coreg, and aldactone. He was seen by Dr Rayann Heman   for consideration of ICD. He did undergo ICD implant on 05/25/20. Thresholds were high post implant but CXR showed stable lead position.   On follow up he is feeling very well. No chest pain, dyspnea, palpitations, edema. Tolerating all his medications well. He is still smoking some.    Past Medical History:  Diagnosis Date  . Abnormal EKG    hx of ischemia showing up on ekg's  . Acute myopericarditis    a. 03/2013: readm with hypoxia, tachycardia, elevated ESR/CRP, elevated troponin with Dressler syndrome and myopericarditis; treated with steroids.  . Acute respiratory failure ([place])    a. 01/2013: VDRF. b. 03/2013: hypoxia requiring supp O2 during adm, resolved by discharge.  . C. difficile colitis    a. 01/2013 during prolonged adm. b. Recurred 03/2013.  . Cardiac tamponade    a. 01/2013 s/p drain.  . Cataracts, bilateral   . Coronary artery disease    a. s/p MI in 2009 in [place] with stenting of the LCX and LAD;  b. 12/2012 NSTEMI/CAD: LM nl, LAD patent prox stent, LCX 50-70 isr (FFR 0.84), RCA dom, 142m EF 40-45%-->Med Rx. c. 01/2013: anterolateral STEMI complicated by pericardial effusion (presumed purulent pericarditis) and tamponade s/p drain, ruptured LV pseudoaneurysm s/p CorMatrix patch, CABGx1 (SVG-OM1), fever, CVA, VDRF, C diff.  . CVA (cerebral infarction)    a. 01/2013 in setting of prolonged hospitalization - residual L arm weakness.  . Dressler syndrome [place]    a. 03/2013: readm with hypoxia, tachycardia, elevated ESR/CRP, elevated troponin with Dressler syndrome and myopericarditis; treated with  steroids.  . Glaucoma   . Hyperlipidemia   . Hypokalemia   . Hyponatremia    a. During late 2014.  . Ischemic cardiomyopathy    a. Sept/Oct 2014: EF ~40% (ICM). b. 03/2013: EF 25-30%. Off ACEI due to hypotension (MIXED NICM/ICM).  Marland Kitchen Optic neuropathy, ischemic   . Pericardial effusion    a. 01/2013: pericardial effusion (presumed purulent pericarditis) and cardiac tamponade s/p  drain. b. Persistent moderate pericardial effusion 03/2013.  . Pre-diabetes   . Pseudoaneurysm of left ventricle of heart    a. 01/2013:  Ruptured inferoposterior LV pseudoaneurysm s/p CorMatrix patch.  . Sinus bradycardia    asymptomatic  . Stroke [facility]) 2009   weak lt side-lt arm  . Tobacco abuse   . Wears dentures    top   Past Surgical History:  Procedure Laterality Date  . CARDIAC CATHETERIZATION  01/06/2013  . COLONOSCOPY WITH PROPOFOL N/A 07/03/2016   Procedure: COLONOSCOPY WITH PROPOFOL;  Surgeon: Manus Gunning, MD;  Location: [facility];  Service: Gastroenterology;  Laterality: N/A;  . CORONARY ANGIOPLASTY WITH STENT PLACEMENT  2000's X 2   "1 + 1" (01/06/2013)  . CORONARY ARTERY BYPASS GRAFT N/A 01/28/2013   Procedure: CORONARY ARTERY BYPASS GRAFTING (CABG);  Surgeon: Melrose Nakayama, MD;  Location: [place];  Service: Open Heart Surgery;  Laterality: N/A;  CABG x one, using left greater saphenous vein harvested endoscopically  . ICD IMPLANT N/A 05/25/2020   Procedure: ICD IMPLANT;  Surgeon: Thompson Grayer, MD;  Location: [place] CV LAB;  Service: Cardiovascular;  Laterality: N/A;  . INTRAOPERATIVE TRANSESOPHAGEAL ECHOCARDIOGRAM N/A 01/28/2013   Procedure: INTRAOPERATIVE TRANSESOPHAGEAL ECHOCARDIOGRAM;  Surgeon: Melrose Nakayama, MD;  Location: [place];  Service: Open Heart Surgery;  Laterality: N/A;  . LEFT HEART CATHETERIZATION WITH CORONARY ANGIOGRAM N/A 01/06/2013   Procedure: LEFT HEART CATHETERIZATION WITH CORONARY ANGIOGRAM;  Surgeon: Matti Minney M [place], MD;  Location: [facility] CATH LAB;  Service: Cardiovascular;  Laterality: N/A;  . LEFT HEART CATHETERIZATION WITH CORONARY ANGIOGRAM N/A 01/21/2013   Procedure: LEFT HEART CATHETERIZATION WITH CORONARY ANGIOGRAM;  Surgeon: Burnell Blanks, MD;  Location: [facility] CATH LAB;  Service: Cardiovascular;  Laterality: N/A;  . LUMBAR LAMINECTOMY/ DECOMPRESSION WITH MET-RX Right 06/30/2018   Procedure: Right minimally invasive  Lumbar Three-Four Far lateral discectomy;  Surgeon: Judith Part, MD;  Location: [place];  Service: Neurosurgery;  Laterality: Right;  Right minimally invasive Lumbar Three-Four Far lateral discectomy  . MASS EXCISION Right 07/21/2014   Procedure: EXCISION RIGHT NECK MASS;  Surgeon: Erroll Luna, MD;  Location: [place];  Service: General;  Laterality: Right;  . PERICARDIAL TAP N/A 01/24/2013   Procedure: PERICARDIAL TAP;  Surgeon: Blane Ohara, MD;  Location: [facility] CATH LAB;  Service: Cardiovascular;  Laterality: N/A;  . RIGHT HEART CATHETERIZATION Right 01/24/2013   Procedure: RIGHT HEART CATH;  Surgeon: Blane Ohara, MD;  Location: [facility] CATH LAB;  Service: Cardiovascular;  Laterality: Right;  . VENTRICULAR ANEURYSM RESECTION N/A 01/28/2013   Procedure: LEFT VENTRICULAR ANEURYSM REPAIR;  Surgeon: Melrose Nakayama, MD;  Location: [place];  Service: Open Heart Surgery;  Laterality: N/A;     Current Meds  Medication Sig  . ALPHAGAN P 0.1 % SOLN INSTILL 1 DROP INTO BOTH EYES TWICE A DAY (Patient taking differently: Place 1 drop into both eyes 2 (two) times daily.)  . aspirin 81 MG EC tablet Take 1 tablet (81 mg total) by mouth daily. Restart on 07/05/2018 (Patient taking differently: Take  81 mg by mouth daily.)  . atorvastatin (LIPITOR) 80 MG tablet TAKE 1 TABLET BY MOUTH EVERY DAY (Patient taking differently: Take 80 mg by mouth daily.)  . carvedilol (COREG) 25 MG tablet TAKE 1 TABLET BY MOUTH TWICE A DAY WITH A MEAL  . carvedilol (COREG) 25 MG tablet Take 1 tablet (25 mg total) by mouth 2 (two) times daily with a meal.  . Cholecalciferol (VITAMIN D) 125 MCG (5000 UT) CAPS Take 5,000 Units by mouth daily.  Marland Kitchen ENTRESTO 97-103 MG TAKE 1 TABLET BY MOUTH TWICE A DAY (Patient taking differently: Take 1 tablet by mouth 2 (two) times daily.)  . furosemide (LASIX) 20 MG tablet TAKE 1 TABLET BY MOUTH EVERY DAY  . gabapentin (NEURONTIN) 300 MG capsule TAKE 1 CAPSULE BY MOUTH  EVERYDAY AT BEDTIME (Patient taking differently: Take 300 mg by mouth at bedtime.)  . latanoprost (XALATAN) 0.005 % ophthalmic solution Place 1 drop into both eyes at bedtime.  . Multiple Vitamin (MULTIVITAMIN WITH MINERALS) TABS tablet Take 1 tablet by mouth daily. Centrum Silver  . nicotine (NICODERM CQ - DOSED IN MG/24 HOURS) 21 mg/24hr patch Place 1 patch (21 mg total) onto the skin daily.  Marland Kitchen spironolactone (ALDACTONE) 25 MG tablet Take 1 tablet (25 mg total) by mouth daily.     Allergies:   Patient has no known allergies.   Social History   Tobacco Use  . Smoking status: Current Every Day Smoker    Packs/day: 1.00    Years: 35.00    Pack years: 35.00    Types: Cigarettes  . Smokeless tobacco: Never Used  Vaping Use  . Vaping Use: Never used  Substance Use Topics  . Alcohol use: No    Alcohol/week: 0.0 standard drinks  . Drug use: No     Family Hx: The patient's family history includes CAD in an other family member; Diabetes in his mother; HIV in his brother; Heart attack in his mother; Hypertension in his father and mother. There is no history of Stroke.  ROS:   Please see the history of present illness.   All other systems reviewed and are negative.   Prior CV studies:   The following studies were reviewed today:  Echo 01/26/19: IMPRESSIONS    1. Left ventricular ejection fraction, by visual estimation, is 25 to 30%. The left ventricle has normal function. Moderately increased left ventricular size. There is no left ventricular hypertrophy.  2. There is akinesis of the inferior and nferolateral wall,.  3. Elevated left ventricular end-diastolic pressure.  4. Left ventricular diastolic Doppler parameters are consistent with restrictive filling pattern of LV diastolic filling.  5. Global right ventricle has normal systolic function.The right ventricular size is normal. No increase in right ventricular wall thickness.  6. Left atrial size was mildly dilated.  7.  Right atrial size was normal.  8. The mitral valve is normal in structure. No evidence of mitral valve regurgitation. No evidence of mitral stenosis.  9. The tricuspid valve is normal in structure. Tricuspid valve regurgitation was not visualized by color flow Doppler. 10. The aortic valve is tricuspid Aortic valve regurgitation was not visualized by color flow Doppler. Mild aortic valve sclerosis without stenosis. 11. The pulmonic valve was normal in structure. Pulmonic valve regurgitation is trivial by color flow Doppler. 12. TR signal is inadequate for assessing pulmonary artery systolic pressure. 13. The inferior vena cava is dilated in size with <50% respiratory variability, suggesting right atrial pressure of 15 mmHg.  Echo  06/29/19: IMPRESSIONS    1. Difficult acoustic windows. LVEF is severely depressed at  approximately 20 to 25% with akinesis of the inferior, and posterior  walls; hypokinesis elsewhere.   Compared to previous echo report, no significant change. . Left  ventricular ejection fraction, by estimation, is 20 to 25%. The left  ventricle has severely decreased function. The left ventricle demonstrates  regional wall motion abnormalities (see  scoring diagram/findings for description). The left ventricular internal  cavity size was mildly dilated. Left ventricular diastolic parameters are  consistent with Grade I diastolic dysfunction (impaired relaxation).  2. Difficult to see RV free wall . Right ventricular systolic function is  mildly reduced. The right ventricular size is normal.   Labs/Other Tests and Data Reviewed:    EKG:  None today.   Recent Labs: 02/29/2020: ALT 15 05/06/2020: BUN 10; Creatinine, Ser 1.03; Hemoglobin 15.4; Platelets 171; Potassium 4.6; Sodium 145   Recent Lipid Panel Lab Results  Component Value Date/Time   CHOL 162 02/29/2020 04:42 PM   TRIG 110 02/29/2020 04:42 PM   HDL 57 02/29/2020 04:42 PM   CHOLHDL 2.8 02/29/2020 04:42 PM    CHOLHDL 2.5 11/03/2015 04:09 PM   LDLCALC 85 02/29/2020 04:42 PM    Wt Readings from Last 3 Encounters:  06/24/20 264 lb (119.7 kg)  05/25/20 267 lb (121.1 kg)  04/18/20 263 lb (119.3 kg)     Objective:    Vital Signs:  BP 110/64   Pulse 64   Ht _0  (1.854 m)   Wt 264 lb (119.7 kg)   SpO2 95%   BMI 34.83 kg/m   GENERAL:  Well appearing BM, overweight HEENT:  PERRL, EOMI, sclera are clear. Oropharynx is clear. NECK:  No jugular venous distention, carotid upstroke brisk and symmetric, no bruits, no thyromegaly or adenopathy LUNGS:  Clear to auscultation bilaterally CHEST:  Unremarkable HEART:  RRR,  PMI not displaced or sustained,S1 and S2 within normal limits, no S3, no S4: no clicks, no rubs, no murmurs ABD:  Soft, nontender. BS +, no masses or bruits. No hepatomegaly, no splenomegaly EXT:  2 + pulses throughout, no edema, no cyanosis no clubbing SKIN:  Warm and dry.  No rashes NEURO:  Alert and oriented x 3. Cranial nerves II through XII intact. PSYCH:  Cognitively intact    ASSESSMENT & PLAN:    1. Chronic systolic CHF. EF 20-25% by most recent Echo. Symptoms improved to class 1-2.  Ischemic cardiomyopathy. Medical therapy has now been optimized without significant improvement in EF. He is now s/p ICD. I do recommend addition of an SGLT 2 inhibitor for his CHF. Will check with pharmacy to assess cost of Jardiance vs Farxiga.   2. CAD s/p remote stenting of LAD and LCx. CTO of the RCA. S/p CABG x 1 in 2014. See #1. No chest pain.   3. History of pericardial tamponade due to LV rupture. S/p patch repair in 2014. No recurrent effusion  4. History of CVA 2014  5. Hyperlipidemia- last LDL at goal  6. Pre diabetes. Will initiate SGLT 2 inhibitor.   7. Tobacco abuse- smoking < 1ppd.  recommend smoking cessation.   Follow up in 6 months.  Signed, Raianna Slight [place], MD  06/24/2020 3:52 PM    [facility]

## 2020-06-20 NOTE — Telephone Encounter (Signed)
°*  STAT* If patient is at the pharmacy, call can be transferred to refill team.   1. Which medications need to be refilled? (please list name of each medication and dose if known) Carvedilol 250 mg   2. Which pharmacy/location (including street and city if local pharmacy) is medication to be sent to? [facility]  3. Do they need a 30 day or 90 day supply? 90 Patient stating that some one  Told the pharmacy whoever sent the last refill licence was revoked. Please advise

## 2020-06-24 ENCOUNTER — Ambulatory Visit (INDEPENDENT_AMBULATORY_CARE_PROVIDER_SITE_OTHER): Payer: Medicare HMO | Admitting: Cardiology

## 2020-06-24 ENCOUNTER — Other Ambulatory Visit: Payer: Self-pay

## 2020-06-24 ENCOUNTER — Encounter: Payer: Self-pay | Admitting: Cardiology

## 2020-06-24 VITALS — BP 110/64 | HR 64 | Ht 73.0 in | Wt 264.0 lb

## 2020-06-24 DIAGNOSIS — I5022 Chronic systolic (congestive) heart failure: Secondary | ICD-10-CM

## 2020-06-24 DIAGNOSIS — Z951 Presence of aortocoronary bypass graft: Secondary | ICD-10-CM

## 2020-06-24 DIAGNOSIS — I10 Essential (primary) hypertension: Secondary | ICD-10-CM | POA: Diagnosis not present

## 2020-06-24 DIAGNOSIS — R6889 Other general symptoms and signs: Secondary | ICD-10-CM | POA: Diagnosis not present

## 2020-06-24 DIAGNOSIS — Z720 Tobacco use: Secondary | ICD-10-CM | POA: Diagnosis not present

## 2020-06-24 DIAGNOSIS — Z95810 Presence of automatic (implantable) cardiac defibrillator: Secondary | ICD-10-CM | POA: Diagnosis not present

## 2020-06-24 DIAGNOSIS — I255 Ischemic cardiomyopathy: Secondary | ICD-10-CM | POA: Diagnosis not present

## 2020-06-24 MED ORDER — EMPAGLIFLOZIN 10 MG PO TABS: 10.0000 mg | ORAL_TABLET | Freq: Every day | ORAL | 3 refills | Status: DC

## 2020-06-24 NOTE — Addendum Note (Signed)
Addended by: Kathyrn Lass on: 06/24/2020 04:14 PM   Modules accepted: Orders

## 2020-06-25 ENCOUNTER — Encounter (HOSPITAL_COMMUNITY): Payer: Self-pay

## 2020-06-25 ENCOUNTER — Ambulatory Visit (HOSPITAL_COMMUNITY)
Admission: EM | Admit: 2020-06-25 | Discharge: 2020-06-25 | Disposition: A | Discharge status: Home / Self Care | Payer: Medicare HMO | Attending: Physician Assistant | Admitting: Physician Assistant

## 2020-06-25 ENCOUNTER — Other Ambulatory Visit: Payer: Self-pay

## 2020-06-25 DIAGNOSIS — M722 Plantar fascial fibromatosis: Secondary | ICD-10-CM | POA: Diagnosis not present

## 2020-06-25 NOTE — ED Triage Notes (Signed)
Pt in with c/o left foot pain that radiates up his left leg that started last night  Pt took tylenol with minimal relief

## 2020-06-25 NOTE — Discharge Instructions (Addendum)
Take ibuprofen as needed Apply ice to affected area .  Follow up with Primary Care Physician

## 2020-06-27 NOTE — ED Provider Notes (Signed)
[place]    CSN: [id number] Arrival date & time: 06/25/20  1409      History   Chief Complaint Chief Complaint  Patient presents with  . Leg Pain  . Foot Pain    HPI [name] is a 60 y.o. male.   Pt complains of mid left foot pain that started yesterday.  Pain only occurs when walking.  He reports some radiation to left heel.  He denies recent injury or trauma.  He denies swelling, redness, warmth.  He has tried nothing for the pain.  He is using a cane to assist with ambulation due to pain, typically does not use a cane.  No previous injuries or problems with this foot.      Past Medical History:  Diagnosis Date  . Abnormal EKG    hx of ischemia showing up on ekg's  . Acute myopericarditis    a. 03/2013: readm with hypoxia, tachycardia, elevated ESR/CRP, elevated troponin with Dressler syndrome and myopericarditis; treated with steroids.  . Acute respiratory failure ([place])    a. 01/2013: VDRF. b. 03/2013: hypoxia requiring supp O2 during adm, resolved by discharge.  . C. difficile colitis    a. 01/2013 during prolonged adm. b. Recurred 03/2013.  . Cardiac tamponade    a. 01/2013 s/p drain.  . Cataracts, bilateral   . Coronary artery disease    a. s/p MI in 2009 in [place] with stenting of the LCX and LAD;  b. 12/2012 NSTEMI/CAD: LM nl, LAD patent prox stent, LCX 50-70 isr (FFR 0.84), RCA dom, 129m EF 40-45%-->Med Rx. c. 01/2013: anterolateral STEMI complicated by pericardial effusion (presumed purulent pericarditis) and tamponade s/p drain, ruptured LV pseudoaneurysm s/p CorMatrix patch, CABGx1 (SVG-OM1), fever, CVA, VDRF, C diff.  . CVA (cerebral infarction)    a. 01/2013 in setting of prolonged hospitalization - residual L arm weakness.  . Dressler syndrome ([place]    a. 03/2013: readm with hypoxia, tachycardia, elevated ESR/CRP, elevated troponin with Dressler syndrome and myopericarditis; treated with steroids.  . Glaucoma   . Hyperlipidemia   .  Hypokalemia   . Hyponatremia    a. During late 2014.  . Ischemic cardiomyopathy    a. Sept/Oct 2014: EF ~40% (ICM). b. 03/2013: EF 25-30%. Off ACEI due to hypotension (MIXED NICM/ICM).  .Marland KitchenOptic neuropathy, ischemic   . Pericardial effusion    a. 01/2013: pericardial effusion (presumed purulent pericarditis) and cardiac tamponade s/p drain. b. Persistent moderate pericardial effusion 03/2013.  . Pre-diabetes   . Pseudoaneurysm of left ventricle of heart    a. 01/2013:  Ruptured inferoposterior LV pseudoaneurysm s/p CorMatrix patch.  . Sinus bradycardia    asymptomatic  . Stroke ([facility] 2009   weak lt side-lt arm  . Tobacco abuse   . Wears dentures    top    Patient Active Problem List   Diagnosis Date Noted  . Hx of CABG 07/09/2019  . Lumbar radiculopathy 12/04/2018  . Glaucoma 12/04/2018  . Cortical age-related cataract of both eyes 12/04/2018  . Pain management contract agreement 12/04/2018  . Lumbar herniated disc 06/04/2018  . Prediabetes 01/01/2018  . Hyperlipidemia 12/30/2017  . Obesity (BMI 30.0-34.9) 12/30/2017  . Benign neoplasm of descending colon   . Essential hypertension 11/03/2015  . Cardiomyopathy, ischemic 06/03/2015  . Chronic systolic dysfunction of left ventricle 06/03/2015  . Erectile dysfunction due to arterial insufficiency 12/23/2014  . Lipoma of skin and subcutaneous tissue of neck 05/24/2014  . History of cardioembolic stroke 079/89/2119 .  Vision loss of right eye 06/23/2013  . Dyslipidemia 06/22/2013  . Dressler syndrome ([place])   . Coronary artery disease   . Cardiomyopathy ([place])   . Pseudoaneurysm of left ventricle of heart   . Tobacco abuse     Past Surgical History:  Procedure Laterality Date  . CARDIAC CATHETERIZATION  01/06/2013  . COLONOSCOPY WITH PROPOFOL N/A 07/03/2016   Procedure: COLONOSCOPY WITH PROPOFOL;  Surgeon: Manus Gunning, MD;  Location: [facility];  Service: Gastroenterology;  Laterality: N/A;  . CORONARY  ANGIOPLASTY WITH STENT PLACEMENT  2000's X 2   "1 + 1" (01/06/2013)  . CORONARY ARTERY BYPASS GRAFT N/A 01/28/2013   Procedure: CORONARY ARTERY BYPASS GRAFTING (CABG);  Surgeon: Melrose Nakayama, MD;  Location: [place];  Service: Open Heart Surgery;  Laterality: N/A;  CABG x one, using left greater saphenous vein harvested endoscopically  . ICD IMPLANT N/A 05/25/2020   Procedure: ICD IMPLANT;  Surgeon: Thompson Grayer, MD;  Location: [place] CV LAB;  Service: Cardiovascular;  Laterality: N/A;  . INTRAOPERATIVE TRANSESOPHAGEAL ECHOCARDIOGRAM N/A 01/28/2013   Procedure: INTRAOPERATIVE TRANSESOPHAGEAL ECHOCARDIOGRAM;  Surgeon: Melrose Nakayama, MD;  Location: [place];  Service: Open Heart Surgery;  Laterality: N/A;  . LEFT HEART CATHETERIZATION WITH CORONARY ANGIOGRAM N/A 01/06/2013   Procedure: LEFT HEART CATHETERIZATION WITH CORONARY ANGIOGRAM;  Surgeon: Peter M Martinique, MD;  [facility] C[facility] LAB;  Service: Cardiovascular;  Laterality: N/A;  . LEFT HEART CATHETERIZATION WITH CORONARY ANGIOGRAM N/A 01/21/2013   Procedure: LEFT HEART CATHETERIZATION WITH CORONARY ANGIOGRAM;  Surgeon: Burnell Blanks, MD;  Location: [facility] CATH LAB;  Service: Cardiovascular;  Laterality: N/A;  . LUMBAR LAMINECTOMY/ DECOMPRESSION WITH MET-RX Right 06/30/2018   Procedure: Right minimally invasive Lumbar Three-Four Far lateral discectomy;  Surgeon: Judith Part, MD;  Location: [place];  Service: Neurosurgery;  Laterality: Right;  Right minimally invasive Lumbar Three-Four Far lateral discectomy  . MASS EXCISION Right 07/21/2014   Procedure: EXCISION RIGHT NECK MASS;  Surgeon: Erroll Luna, MD;  Location: [place];  Service: General;  Laterality: Right;  . PERICARDIAL TAP N/A 01/24/2013   Procedure: PERICARDIAL TAP;  Surgeon: Blane Ohara, MD;  Location: [facility] CATH LAB;  Service: Cardiovascular;  Laterality: N/A;  . RIGHT HEART CATHETERIZATION Right 01/24/2013   Procedure: RIGHT HEART CATH;   Surgeon: Blane Ohara, MD;  Location: [facility] CATH LAB;  Service: Cardiovascular;  Laterality: Right;  . VENTRICULAR ANEURYSM RESECTION N/A 01/28/2013   Procedure: LEFT VENTRICULAR ANEURYSM REPAIR;  Surgeon: Melrose Nakayama, MD;  Location: [place];  Service: Open Heart Surgery;  Laterality: N/A;       Home Medications    Prior to Admission medications   Medication Sig Start Date End Date Taking? Authorizing Provider  ALPHAGAN P 0.1 % SOLN INSTILL 1 DROP INTO BOTH EYES TWICE A DAY Patient taking differently: Place 1 drop into both eyes 2 (two) times daily. 10/26/19   Ladell Pier, MD  aspirin 81 MG EC tablet Take 1 tablet (81 mg total) by mouth daily. Restart on 07/05/2018 Patient taking differently: Take 81 mg by mouth daily. 06/30/18   Judith Part, MD  atorvastatin (LIPITOR) 80 MG tablet TAKE 1 TABLET BY MOUTH EVERY DAY Patient taking differently: Take 80 mg by mouth daily. 04/21/20   Ladell Pier, MD  carvedilol (COREG) 25 MG tablet TAKE 1 TABLET BY MOUTH TWICE A DAY WITH A MEAL 06/20/20   Martinique, Peter M, MD  carvedilol (COREG) 25 MG tablet  Take 1 tablet (25 mg total) by mouth 2 (two) times daily with a meal. 06/19/20   Kathyrn Drown D, NP  Cholecalciferol (VITAMIN D) 125 MCG (5000 UT) CAPS Take 5,000 Units by mouth daily.    [provider]  empagliflozin (JARDIANCE) 10 MG TABS tablet Take 1 tablet (10 mg total) by mouth daily before breakfast. 06/24/20   Martinique, Peter M, MD  ENTRESTO 97-103 MG TAKE 1 TABLET BY MOUTH TWICE A DAY Patient taking differently: Take 1 tablet by mouth 2 (two) times daily. 05/05/20   Martinique, Peter M, MD  furosemide (LASIX) 20 MG tablet TAKE 1 TABLET BY MOUTH EVERY DAY 03/23/20   Ladell Pier, MD  gabapentin (NEURONTIN) 300 MG capsule TAKE 1 CAPSULE BY MOUTH EVERYDAY AT BEDTIME Patient taking differently: Take 300 mg by mouth at bedtime. 05/07/20   Ladell Pier, MD  latanoprost (XALATAN) 0.005 % ophthalmic solution Place 1 drop  into both eyes at bedtime. 05/31/16   [provider]  Multiple Vitamin (MULTIVITAMIN WITH MINERALS) TABS tablet Take 1 tablet by mouth daily. Centrum Silver    [provider]  nicotine (NICODERM CQ - DOSED IN MG/24 HOURS) 21 mg/24hr patch Place 1 patch (21 mg total) onto the skin daily. 02/29/20   Ladell Pier, MD  spironolactone (ALDACTONE) 25 MG tablet Take 1 tablet (25 mg total) by mouth daily. 04/22/20 07/21/20  Ladell Pier, MD    Family History Family History  Problem Relation Age of Onset  . Heart attack Mother   . Diabetes Mother   . Hypertension Mother   . Hypertension Father   . CAD Other   . HIV Brother   . Stroke Neg Hx     Social History Social History   Tobacco Use  . Smoking status: Current Every Day Smoker    Packs/day: 1.00    Years: 35.00    Pack years: 35.00    Types: Cigarettes  . Smokeless tobacco: Never Used  Vaping Use  . Vaping Use: Never used  Substance Use Topics  . Alcohol use: No    Alcohol/week: 0.0 standard drinks  . Drug use: No     Allergies   Patient has no known allergies.   Review of Systems Review of Systems  Constitutional: Negative for chills and fever.  HENT: Negative for ear pain and sore throat.   Eyes: Negative for pain and visual disturbance.  Respiratory: Negative for cough and shortness of breath.   Cardiovascular: Negative for chest pain and palpitations.  Gastrointestinal: Negative for abdominal pain and vomiting.  Genitourinary: Negative for dysuria and hematuria.  Musculoskeletal: Positive for arthralgias (left foot pain). Negative for back pain.  Skin: Negative for color change and rash.  Neurological: Negative for seizures and syncope.  All other systems reviewed and are negative.    Physical Exam Triage Vital Signs ED Triage Vitals  Enc Vitals Group     BP 06/25/20 1420 117/72     Pulse Rate 06/25/20 1420 65     Resp 06/25/20 1423 20     Temp 06/25/20 1420 98.4 F (36.9 C)      Temp src --      SpO2 06/25/20 1420 95 %     Weight --      Height --      Head Circumference --      Peak Flow --      Pain Score 06/25/20 1419 4     Pain Loc --  Pain Edu? --      Excl. in [place]? --    No data found.  Updated Vital Signs BP 117/72   Pulse 65   Temp 98.4 F (36.9 C)   Resp 20   SpO2 95%   Visual Acuity Right Eye Distance:   Left Eye Distance:   Bilateral Distance:    Right Eye Near:   Left Eye Near:    Bilateral Near:     Physical Exam Vitals and nursing note reviewed.  Constitutional:      Appearance: He is well-developed.  HENT:     Head: Normocephalic and atraumatic.  Eyes:     Conjunctiva/sclera: Conjunctivae normal.  Cardiovascular:     Rate and Rhythm: Normal rate and regular rhythm.     Heart sounds: No murmur heard.   Pulmonary:     Effort: Pulmonary effort is normal. No respiratory distress.     Breath sounds: Normal breath sounds.  Abdominal:     Palpations: Abdomen is soft.     Tenderness: There is no abdominal tenderness.  Musculoskeletal:     Cervical back: Neck supple.     Right foot: Normal.     Left foot: Normal range of motion. No swelling, deformity or tenderness. Normal pulse.     Comments: No swelling, redness, warmth noted.  No tenderness to palpation.    Skin:    General: Skin is warm and dry.  Neurological:     Mental Status: He is alert.      UC Treatments / Results  Labs (all labs ordered are listed, but only abnormal results are displayed) Labs Reviewed - No data to display  EKG   Radiology No results found.  Procedures Procedures (including critical care time)  Medications Ordered in UC Medications - No data to display  Initial Impression / Assessment and Plan / UC Course  I have reviewed the triage vital signs and the nursing notes.  Pertinent labs & imaging results that were available during my care of the patient were reviewed by me and considered in my medical decision making (see  chart for details).     No recent injury or trauma.  Normal exam.  Likely plantar fasciitis given mid foot pain which is worse with weight bearing.  Recommend stretching, ibuprofen, and ice.  Follow up with PCP if no improvement.  Final Clinical Impressions(s) / UC Diagnoses   Final diagnoses:  Plantar fasciitis     Discharge Instructions     Take ibuprofen as needed Apply ice to affected area .  Follow up with Primary Care Physician    ED Prescriptions    None     PDMP not reviewed this encounter.   Konrad Felix, PA-C 06/27/20 1707

## 2020-07-04 ENCOUNTER — Telehealth: Payer: Self-pay | Admitting: Cardiology

## 2020-07-04 NOTE — Telephone Encounter (Signed)
Spoke to patient he stated he cannot take Jardiance due to nausea and vomiting.He wanted to know if he could try [place].Advised I will check with Dr.Jordan.

## 2020-07-04 NOTE — Telephone Encounter (Signed)
Pt c/o medication issue:  1. Name of Medication: empagliflozin (JARDIANCE) 10 MG TABS tablet  2. How are you currently taking this medication (dosage and times per day)? Patient has not taken since Friday  3. Are you having a reaction (difficulty breathing--STAT)?   4. What is your medication issue? Patient states medication makes him extremely nauseous and he throws up all the time. He wanted to know if there was another medication he could try. HE really would just like to speak to [facility]

## 2020-07-04 NOTE — Telephone Encounter (Signed)
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

## 2020-07-04 NOTE — Telephone Encounter (Signed)
Spoke to patient Dr.Jordan advised since Jardiance caused nausea and vomiting Wilder Glade will too.He advised not to take.

## 2020-07-08 ENCOUNTER — Telehealth: Payer: Medicare HMO | Admitting: Internal Medicine

## 2020-07-21 ENCOUNTER — Encounter: Payer: Self-pay | Admitting: Internal Medicine

## 2020-07-21 ENCOUNTER — Other Ambulatory Visit: Payer: Self-pay

## 2020-07-21 ENCOUNTER — Ambulatory Visit: Payer: Medicare HMO | Attending: Internal Medicine | Admitting: Internal Medicine

## 2020-07-21 VITALS — BP 114/78 | HR 68 | Resp 16 | Wt 264.2 lb

## 2020-07-21 DIAGNOSIS — I10 Essential (primary) hypertension: Secondary | ICD-10-CM | POA: Diagnosis not present

## 2020-07-21 DIAGNOSIS — I25810 Atherosclerosis of coronary artery bypass graft(s) without angina pectoris: Secondary | ICD-10-CM

## 2020-07-21 DIAGNOSIS — M5416 Radiculopathy, lumbar region: Secondary | ICD-10-CM | POA: Diagnosis not present

## 2020-07-21 DIAGNOSIS — M79604 Pain in right leg: Secondary | ICD-10-CM | POA: Diagnosis not present

## 2020-07-21 DIAGNOSIS — F17200 Nicotine dependence, unspecified, uncomplicated: Secondary | ICD-10-CM | POA: Diagnosis not present

## 2020-07-21 MED ORDER — METHOCARBAMOL 500 MG PO TABS: 500.0000 mg | ORAL_TABLET | Freq: Two times a day (BID) | ORAL | 0 refills | Status: DC | PRN

## 2020-07-21 MED ORDER — NICOTINE 21 MG/24HR TD PT24
21.0000 mg | MEDICATED_PATCH | Freq: Every day | TRANSDERMAL | 1 refills | Status: DC
Start: 2020-07-21 — End: 2021-02-09

## 2020-07-21 MED ORDER — TRAMADOL HCL 50 MG PO TABS: 50.0000 mg | ORAL_TABLET | Freq: Three times a day (TID) | ORAL | 0 refills | Status: DC | PRN
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
## 2020-07-21 NOTE — Patient Instructions (Signed)
Use the Tramadol and Robaxin as needed.  Both can cause drowsiness Get the x-rays done as discussed.

## 2020-07-21 NOTE — Progress Notes (Signed)
Pt states he has pain in his right leg

## 2020-07-27 ENCOUNTER — Telehealth: Payer: Self-pay | Admitting: Internal Medicine

## 2020-07-27 NOTE — Telephone Encounter (Signed)
Will forward to provider  

## 2020-07-27 NOTE — Telephone Encounter (Signed)
Copied from [place] [id number]. Topic: General - Other >> Jul 27, 2020 11:46 AM Celene Kras wrote: Reason for CRM: Pt called and is requesting to have a referral to a neurologist. He states that he has not been able to see the one he was previously referred to. Pt is requesting to have a call back from PCP or nurse. Please advise.

## 2020-07-28 NOTE — Telephone Encounter (Signed)
Returned pt call and provider response. Provided pt the phone number. Pt doesn't have any questions or concerns

## 2020-08-09 ENCOUNTER — Telehealth: Payer: Self-pay | Admitting: Internal Medicine

## 2020-08-09 NOTE — Telephone Encounter (Signed)
Copied from [employer] [id number]. Topic: General - Other >> Aug 09, 2020 11:47 AM Yvette Rack wrote: Reason for CRM: Pt requests call back from [facility]. Cb#  [phone]

## 2020-08-11 NOTE — Telephone Encounter (Signed)
Returned pt call. Pt states he has some paperwork from housing showing that it will be better for him to stay on the lower level.. Pt is requesting a letter from provider to support him. Pt states he will bring paperwork today and I will give it to provider.

## 2020-08-11 NOTE — Telephone Encounter (Signed)
Pt called and is requesting to speak with PCP nurse. PT states that he has paperwork that he is needing to have filled out due to leg pain he is experiencing. He states that he is needing this filled out for his housing development so that he is able to live in one level housing instead of double level housing. Please advise.

## 2020-08-19 DIAGNOSIS — H52223 Regular astigmatism, bilateral: Secondary | ICD-10-CM | POA: Diagnosis not present

## 2020-08-19 DIAGNOSIS — R6889 Other general symptoms and signs: Secondary | ICD-10-CM | POA: Diagnosis not present

## 2020-08-19 DIAGNOSIS — H524 Presbyopia: Secondary | ICD-10-CM | POA: Diagnosis not present

## 2020-08-24 ENCOUNTER — Ambulatory Visit (INDEPENDENT_AMBULATORY_CARE_PROVIDER_SITE_OTHER): Payer: Medicare HMO

## 2020-08-24 DIAGNOSIS — I5022 Chronic systolic (congestive) heart failure: Secondary | ICD-10-CM

## 2020-08-24 DIAGNOSIS — I255 Ischemic cardiomyopathy: Secondary | ICD-10-CM

## 2020-08-25 LAB — CUP PACEART REMOTE DEVICE CHECK
Battery Remaining Longevity: 110 mo
Battery Remaining Percentage: 95 %
Battery Voltage: 3.05 V
Brady Statistic RV Percent Paced: 1 %
Date Time Interrogation Session: 20220511030447
HighPow Impedance: 56 Ohm
Implantable Lead Implant Date: 20220209
Implantable Lead Location: 753860
Implantable Pulse Generator Implant Date: 20220209
Lead Channel Impedance Value: 430 Ohm
Lead Channel Pacing Threshold Amplitude: 1.75 V
Lead Channel Pacing Threshold Pulse Width: 1 ms
Lead Channel Sensing Intrinsic Amplitude: 4.9 mV
Lead Channel Setting Pacing Amplitude: 3.5 V
Lead Channel Setting Pacing Pulse Width: 0.5 ms
Lead Channel Setting Sensing Sensitivity: 0.5 mV
Pulse Gen Serial Number: 111033765

## 2020-08-26 ENCOUNTER — Other Ambulatory Visit: Payer: Self-pay

## 2020-08-26 ENCOUNTER — Telehealth: Payer: Self-pay

## 2020-08-26 ENCOUNTER — Ambulatory Visit (INDEPENDENT_AMBULATORY_CARE_PROVIDER_SITE_OTHER): Payer: Medicare HMO | Admitting: Internal Medicine

## 2020-08-26 ENCOUNTER — Encounter: Payer: Self-pay | Admitting: Internal Medicine

## 2020-08-26 VITALS — BP 100/64 | HR 59 | Ht 73.0 in | Wt 265.6 lb

## 2020-08-26 DIAGNOSIS — I5022 Chronic systolic (congestive) heart failure: Secondary | ICD-10-CM | POA: Diagnosis not present

## 2020-08-26 DIAGNOSIS — I255 Ischemic cardiomyopathy: Secondary | ICD-10-CM | POA: Diagnosis not present

## 2020-08-26 NOTE — Patient Instructions (Addendum)
Medication Instructions:  Your physician recommends that you continue on your current medications as directed. Please refer to the Current Medication list given to you today.  Labwork: None ordered.  Testing/Procedures: None ordered.  Follow-Up: Your physician wants you to follow-up in: 08/28/21 at 2 pm with Dr. Rayann Heman.   Any Other Special Instructions Will Be Listed Below (If Applicable).  If you need a refill on your cardiac medications before your next appointment, please call your pharmacy.     Dear Patient:  The [facility] device clinic team is grateful for the opportunity to partner with you in your implanted pacemaker/ defibrillator needs.  Our goals are to provide the most up-to-date and comprehensive care for you and your implanted device.    Sharman Cheek is a specially trained registered nurse who will follow your device heart failure diagnostics.  She will call you weekly following discharge and review your medications as well as your daily weights and breathing status.  During the call, she will review with you the results of your recent device interrogation.    We believe that this program will allow [place] to better care for you.  Multiple studies have shown that remote monitoring can help to decrease hospitalizations as well as improve quality of life and decrease your risks of dying.  Through this program, we hope to partner with you to optimize your cardiac care.   Once again, we would like to thank you for allowing [place] to participate in your care.  If you have any questions, please call the office at [phone].    Sincerely,   [facility] Team and Advanced Heart Failure Team

## 2020-08-26 NOTE — Telephone Encounter (Signed)
Returned pt call. Pt states they are going to be sending paperwork to Dr. Wynetta Emery to fill out for pt to get a lower level apartment because of his condition and wants me to be on the look out

## 2020-08-26 NOTE — Telephone Encounter (Signed)
Copied from [place] [phone]. Topic: General - Other >> Aug 23, 2020  3:42 PM Alanda Slim E wrote: Reason for CRM: Pt asked for Jay'a to give him a call about some [place] paperwork / please advise

## 2020-08-26 NOTE — Progress Notes (Signed)
PCP: Ladell Pier, [place] Primary Cardiologist: Dr Martinique Primary EP: Dr Aileen Pilot is a 60 y.o. male who presents today for routine electrophysiology followup.  Since his ICD implant, the patient reports doing very well.  Today, he denies symptoms of palpitations, chest pain, shortness of breath,  lower extremity edema, dizziness, presyncope, syncope, or ICD shocks.  The patient is otherwise without complaint today.   Past Medical History:  Diagnosis Date  . Abnormal EKG    hx of ischemia showing up on ekg's  . Acute myopericarditis    a. 03/2013: readm with hypoxia, tachycardia, elevated ESR/CRP, elevated troponin with Dressler syndrome and myopericarditis; treated with steroids.  . Acute respiratory failure ([place])    a. 01/2013: VDRF. b. 03/2013: hypoxia requiring supp O2 during adm, resolved by discharge.  . C. difficile colitis    a. 01/2013 during prolonged adm. b. Recurred 03/2013.  . Cardiac tamponade    a. 01/2013 s/p drain.  . Cataracts, bilateral   . Coronary artery disease    a. s/p MI in 2009 in [place] with stenting of the LCX and LAD;  b. 12/2012 NSTEMI/CAD: LM nl, LAD patent prox stent, LCX 50-70 isr (FFR 0.84), RCA dom, 155m EF 40-45%-->Med Rx. c. 01/2013: anterolateral STEMI complicated by pericardial effusion (presumed purulent pericarditis) and tamponade s/p drain, ruptured LV pseudoaneurysm s/p CorMatrix patch, CABGx1 (SVG-OM1), fever, CVA, VDRF, C diff.  . CVA (cerebral infarction)    a. 01/2013 in setting of prolonged hospitalization - residual L arm weakness.  . Dressler [place]    a. 03/2013: readm with hypoxia, tachycardia, elevated ESR/CRP, elevated troponin with Dressler syndrome and myopericarditis; treated with steroids.  . Glaucoma   . Hyperlipidemia   . Hypokalemia   . Hyponatremia    a. During late 2014.  . Ischemic cardiomyopathy    a. Sept/Oct 2014: EF ~40% (ICM). b. 03/2013: EF 25-30%. Off ACEI due to hypotension (MIXED  NICM/ICM).  .Marland KitchenOptic neuropathy, ischemic   . Pericardial effusion    a. 01/2013: pericardial effusion (presumed purulent pericarditis) and cardiac tamponade s/p drain. b. Persistent moderate pericardial effusion 03/2013.  . Pre-diabetes   . Pseudoaneurysm of left ventricle of heart    a. 01/2013:  Ruptured inferoposterior LV pseudoaneurysm s/p CorMatrix patch.  . Sinus bradycardia    asymptomatic  . Stroke ([facility] 2009   weak lt side-lt arm  . Tobacco abuse   . Wears dentures    top   Past Surgical History:  Procedure Laterality Date  . CARDIAC CATHETERIZATION  01/06/2013  . COLONOSCOPY WITH PROPOFOL N/A 07/03/2016   Procedure: COLONOSCOPY WITH PROPOFOL;  Surgeon: SManus Gunning MD;  Location: [facility];  Service: Gastroenterology;  Laterality: N/A;  . CORONARY ANGIOPLASTY WITH STENT PLACEMENT  2000's X 2   "1 + 1" (01/06/2013)  . CORONARY ARTERY BYPASS GRAFT N/A 01/28/2013   Procedure: CORONARY ARTERY BYPASS GRAFTING (CABG);  Surgeon: SMelrose Nakayama MD;  [place]  Service: Open Heart Surgery;  Laterality: N/A;  CABG x one, using left greater saphenous vein harvested endoscopically  . ICD IMPLANT N/A 05/25/2020   Procedure: ICD IMPLANT;  Surgeon: AThompson Grayer MD;  [place] [facility];  Service: Cardiovascular;  Laterality: N/A;  . INTRAOPERATIVE TRANSESOPHAGEAL ECHOCARDIOGRAM N/A 01/28/2013   Procedure: INTRAOPERATIVE TRANSESOPHAGEAL ECHOCARDIOGRAM;  Surgeon: SMelrose Nakayama MD;  [place]  Service: Open Heart Surgery;  Laterality: N/A;  . LEFT HEART CATHETERIZATION WITH CORONARY ANGIOGRAM N/A 01/06/2013  Procedure: LEFT HEART CATHETERIZATION WITH CORONARY ANGIOGRAM;  Surgeon: Peter M Martinique, MD;  [facility] C[facility] LAB;  Service: Cardiovascular;  Laterality: N/A;  . LEFT HEART CATHETERIZATION WITH CORONARY ANGIOGRAM N/A 01/21/2013   Procedure: LEFT HEART CATHETERIZATION WITH CORONARY ANGIOGRAM;  Surgeon: Burnell Blanks, MD;  Location:  [facility] CATH LAB;  Service: Cardiovascular;  Laterality: N/A;  . LUMBAR LAMINECTOMY/ DECOMPRESSION WITH MET-RX Right 06/30/2018   Procedure: Right minimally invasive Lumbar Three-Four Far lateral discectomy;  Surgeon: Judith Part, MD;  Location: [place];  Service: Neurosurgery;  Laterality: Right;  Right minimally invasive Lumbar Three-Four Far lateral discectomy  . MASS EXCISION Right 07/21/2014   Procedure: EXCISION RIGHT NECK MASS;  Surgeon: Erroll Luna, MD;  Location: [place];  Service: General;  Laterality: Right;  . PERICARDIAL TAP N/A 01/24/2013   Procedure: PERICARDIAL TAP;  Surgeon: Blane Ohara, MD;  Location: [facility] CATH LAB;  Service: Cardiovascular;  Laterality: N/A;  . RIGHT HEART CATHETERIZATION Right 01/24/2013   Procedure: RIGHT HEART CATH;  Surgeon: Blane Ohara, MD;  Location[facility] CATH LAB;  Service: Cardiovascular;  Laterality: Right;  . VENTRICULAR ANEURYSM RESECTION N/A 01/28/2013   Procedure: LEFT VENTRICULAR ANEURYSM REPAIR;  Surgeon: Melrose Nakayama, MD;  Location: [place];  Service: Open Heart Surgery;  Laterality: N/A;    ROS- all systems are reviewed and negative except as per HPI above  Current Outpatient Medications  Medication Sig Dispense Refill  . ALPHAGAN P 0.1 % SOLN INSTILL 1 DROP INTO BOTH EYES TWICE A DAY (Patient taking differently: Place 1 drop into both eyes 2 (two) times daily.) 10 mL 0  . aspirin 81 MG EC tablet Take 1 tablet (81 mg total) by mouth daily. Restart on 07/05/2018 (Patient taking differently: Take 81 mg by mouth daily.) 90 tablet 3  . atorvastatin (LIPITOR) 80 MG tablet TAKE 1 TABLET BY MOUTH EVERY DAY (Patient taking differently: Take 80 mg by mouth daily.) 90 tablet 3  . carvedilol (COREG) 25 MG tablet TAKE 1 TABLET BY MOUTH TWICE A DAY WITH A MEAL 180 tablet 3  . carvedilol (COREG) 25 MG tablet Take 1 tablet (25 mg total) by mouth 2 (two) times daily with a meal. 180 tablet 3  . Cholecalciferol (VITAMIN D) 125  MCG (5000 UT) CAPS Take 5,000 Units by mouth daily.    . empagliflozin (JARDIANCE) 10 MG TABS tablet Take 1 tablet (10 mg total) by mouth daily before breakfast. 90 tablet 3  . ENTRESTO 97-103 MG TAKE 1 TABLET BY MOUTH TWICE A DAY (Patient taking differently: Take 1 tablet by mouth 2 (two) times daily.) 180 tablet 3  . furosemide (LASIX) 20 MG tablet TAKE 1 TABLET BY MOUTH EVERY DAY 2 tablet 0  . gabapentin (NEURONTIN) 300 MG capsule TAKE 1 CAPSULE BY MOUTH EVERYDAY AT BEDTIME (Patient taking differently: Take 300 mg by mouth at bedtime.) 90 capsule 1  . latanoprost (XALATAN) 0.005 % ophthalmic solution Place 1 drop into both eyes at bedtime.  12  . methocarbamol (ROBAXIN) 500 MG tablet Take 1 tablet (500 mg total) by mouth 2 (two) times daily as needed for muscle spasms. 30 tablet 0  . Multiple Vitamin (MULTIVITAMIN WITH MINERALS) TABS tablet Take 1 tablet by mouth daily. Centrum Silver    . nicotine (NICODERM CQ - DOSED IN MG/24 HOURS) 21 mg/24hr patch Place 1 patch (21 mg total) onto the skin daily. 28 patch 1  . traMADol (ULTRAM) 50 MG tablet Take 1 tablet (50  mg total) by mouth every 8 (eight) hours as needed. 30 tablet 0  . spironolactone (ALDACTONE) 25 MG tablet Take 1 tablet (25 mg total) by mouth daily. 90 tablet 3   No current facility-administered medications for this visit.    Physical Exam: Vitals:   08/26/20 1407  BP: 100/64  Pulse: (!) 59  SpO2: 94%  Weight: 265 lb 9.6 oz (120.5 kg)  Height: 6' 1"  (1.854 m)    GEN- The patient is well appearing, alert and oriented x 3 today.   Head- normocephalic, atraumatic Eyes-  Sclera clear, conjunctiva pink Ears- hearing intact Oropharynx- clear Lungs- Clear to ausculation bilaterally, normal work of breathing Chest- ICD pocket is well healed Heart- Regular rate and rhythm, no murmurs, rubs or gallops, PMI not laterally displaced GI- soft, NT, ND, + BS Extremities- no clubbing, cyanosis, or edema  ICD interrogation- reviewed in  detail today,  See PACEART report  ekg tracing ordered today is personally reviewed and shows sinus with PR 246 msec  Wt Readings from Last 3 Encounters:  08/26/20 265 lb 9.6 oz (120.5 kg)  07/21/20 264 lb 3.2 oz (119.8 kg)  06/24/20 264 lb (119.7 kg)    Assessment and Plan:  1.  Chronic systolic dysfunction/ ischemic CM/ CAD euvolemic today No ischemic symptoms Stable on an appropriate medical regimen RV threshold rose early (CXR 06/07/20 reviewed) but has since been stable Normal ICD function See Pace Art report No changes today he is not device dependant today Enroll to be followed in ICM device clinic   Risks, benefits and potential toxicities for medications prescribed and/or refilled reviewed with patient today.   Thompson Grayer MD, [facility] 08/26/2020 2:17 PM

## 2020-09-06 DIAGNOSIS — R6889 Other general symptoms and signs: Secondary | ICD-10-CM | POA: Diagnosis not present

## 2020-09-07 ENCOUNTER — Other Ambulatory Visit: Payer: Self-pay | Admitting: Internal Medicine

## 2020-09-07 DIAGNOSIS — M5416 Radiculopathy, lumbar region: Secondary | ICD-10-CM

## 2020-09-07 NOTE — Telephone Encounter (Signed)
Requested medication (s) are due for refill today - UNSURE  Requested medication (s) are on the active medication list -YES  Future visit scheduled -NO  Last refill: 07/21/20  Notes to clinic: Request non delegated Rx  Requested Prescriptions  Pending Prescriptions Disp Refills   methocarbamol (ROBAXIN) 500 MG tablet [Pharmacy Med Name: METHOCARBAMOL 500 MG TABLET] 30 tablet 0    Sig: Take 1 tablet (500 mg total) by mouth 2 (two) times daily as needed for muscle spasms.      Not Delegated - Analgesics:  Muscle Relaxants Failed - 09/07/2020 11:10 AM      Failed - This refill cannot be delegated      Passed - Valid encounter within last 6 months    Recent Outpatient Visits           1 month ago Lumbar radiculopathy   [place] Ladell Pier, MD   6 months ago Essential hypertension   [place], [place]   7 months ago Encounter for [employer] annual wellness exam   [place], [place], NP   10 months ago Essential hypertension   [place], [place], NP   1 year ago Ischemic cardiomyopathy   [place] Ladell Pier, MD                    Requested Prescriptions  Pending Prescriptions Disp Refills   methocarbamol (ROBAXIN) 500 MG tablet [Pharmacy Med Name: METHOCARBAMOL 500 MG TABLET] 30 tablet 0    Sig: Take 1 tablet (500 mg total) by mouth 2 (two) times daily as needed for muscle spasms.      Not Delegated - Analgesics:  Muscle Relaxants Failed - 09/07/2020 11:10 AM      Failed - This refill cannot be delegated      Passed - Valid encounter within last 6 months    Recent Outpatient Visits           1 month ago Lumbar radiculopathy   [place], MD   6 months ago Essential hypertension   [place], MD   7 months ago Encounter for [employer] annual wellness exam   [place], [place], NP   10 months ago Essential hypertension   [place], [place], NP   1 year ago Ischemic cardiomyopathy   [place] Ladell Pier, MD

## 2020-09-10 ENCOUNTER — Inpatient Hospital Stay (HOSPITAL_COMMUNITY)
Admission: EM | Admit: 2020-09-10 | Discharge: 2020-09-14 | DRG: 287 | Disposition: A | Discharge status: Home / Self Care | Payer: Medicare HMO | Attending: Internal Medicine | Admitting: Internal Medicine

## 2020-09-10 ENCOUNTER — Encounter (HOSPITAL_COMMUNITY): Payer: Self-pay

## 2020-09-10 ENCOUNTER — Other Ambulatory Visit: Payer: Self-pay

## 2020-09-10 ENCOUNTER — Emergency Department (HOSPITAL_COMMUNITY): Payer: Medicare HMO

## 2020-09-10 DIAGNOSIS — Z79899 Other long term (current) drug therapy: Secondary | ICD-10-CM

## 2020-09-10 DIAGNOSIS — I2582 Chronic total occlusion of coronary artery: Secondary | ICD-10-CM | POA: Diagnosis present

## 2020-09-10 DIAGNOSIS — Z87891 Personal history of nicotine dependence: Secondary | ICD-10-CM

## 2020-09-10 DIAGNOSIS — Z7982 Long term (current) use of aspirin: Secondary | ICD-10-CM

## 2020-09-10 DIAGNOSIS — Z20822 Contact with and (suspected) exposure to covid-19: Secondary | ICD-10-CM | POA: Diagnosis present

## 2020-09-10 DIAGNOSIS — Z95810 Presence of automatic (implantable) cardiac defibrillator: Secondary | ICD-10-CM

## 2020-09-10 DIAGNOSIS — I5022 Chronic systolic (congestive) heart failure: Secondary | ICD-10-CM | POA: Diagnosis present

## 2020-09-10 DIAGNOSIS — I200 Unstable angina: Secondary | ICD-10-CM

## 2020-09-10 DIAGNOSIS — Z6835 Body mass index (BMI) 35.0-35.9, adult: Secondary | ICD-10-CM

## 2020-09-10 DIAGNOSIS — R079 Chest pain, unspecified: Secondary | ICD-10-CM | POA: Diagnosis not present

## 2020-09-10 DIAGNOSIS — I252 Old myocardial infarction: Secondary | ICD-10-CM

## 2020-09-10 DIAGNOSIS — I214 Non-ST elevation (NSTEMI) myocardial infarction: Secondary | ICD-10-CM | POA: Diagnosis present

## 2020-09-10 DIAGNOSIS — E785 Hyperlipidemia, unspecified: Secondary | ICD-10-CM | POA: Diagnosis present

## 2020-09-10 DIAGNOSIS — E669 Obesity, unspecified: Secondary | ICD-10-CM | POA: Diagnosis present

## 2020-09-10 DIAGNOSIS — I25110 Atherosclerotic heart disease of native coronary artery with unstable angina pectoris: Principal | ICD-10-CM | POA: Diagnosis present

## 2020-09-10 DIAGNOSIS — I110 Hypertensive heart disease with heart failure: Secondary | ICD-10-CM | POA: Diagnosis present

## 2020-09-10 DIAGNOSIS — I428 Other cardiomyopathies: Secondary | ICD-10-CM | POA: Diagnosis present

## 2020-09-10 DIAGNOSIS — I248 Other forms of acute ischemic heart disease: Secondary | ICD-10-CM | POA: Diagnosis present

## 2020-09-10 DIAGNOSIS — R7303 Prediabetes: Secondary | ICD-10-CM | POA: Diagnosis present

## 2020-09-10 DIAGNOSIS — H269 Unspecified cataract: Secondary | ICD-10-CM | POA: Diagnosis present

## 2020-09-10 DIAGNOSIS — Z8249 Family history of ischemic heart disease and other diseases of the circulatory system: Secondary | ICD-10-CM

## 2020-09-10 DIAGNOSIS — I959 Hypotension, unspecified: Secondary | ICD-10-CM | POA: Diagnosis present

## 2020-09-10 DIAGNOSIS — I255 Ischemic cardiomyopathy: Secondary | ICD-10-CM | POA: Diagnosis present

## 2020-09-10 DIAGNOSIS — Z951 Presence of aortocoronary bypass graft: Secondary | ICD-10-CM

## 2020-09-10 DIAGNOSIS — I69334 Monoplegia of upper limb following cerebral infarction affecting left non-dominant side: Secondary | ICD-10-CM

## 2020-09-10 DIAGNOSIS — R001 Bradycardia, unspecified: Secondary | ICD-10-CM | POA: Diagnosis not present

## 2020-09-10 DIAGNOSIS — R200 Anesthesia of skin: Secondary | ICD-10-CM | POA: Diagnosis not present

## 2020-09-10 DIAGNOSIS — Z833 Family history of diabetes mellitus: Secondary | ICD-10-CM

## 2020-09-10 DIAGNOSIS — R9431 Abnormal electrocardiogram [ECG] [EKG]: Secondary | ICD-10-CM

## 2020-09-10 DIAGNOSIS — I10 Essential (primary) hypertension: Secondary | ICD-10-CM | POA: Diagnosis present

## 2020-09-10 LAB — BASIC METABOLIC PANEL
Anion gap: 10 (ref 5–15)
BUN: 14 mg/dL (ref 6–20)
CO2: 26 mmol/L (ref 22–32)
Calcium: 9.6 mg/dL (ref 8.9–10.3)
Chloride: 105 mmol/L (ref 98–111)
Creatinine, Ser: 1.18 mg/dL (ref 0.61–1.24)
GFR, Estimated: >60 mL/min (ref >60)
Glucose, Bld: 99 mg/dL (ref 70–99)
Potassium: 4.3 mmol/L (ref 3.5–5.1)
Sodium: 141 mmol/L (ref 135–145)

## 2020-09-10 LAB — CBC
HCT: 45.2 % (ref 39.0–52.0)
Hemoglobin: 15.3 g/dL (ref 13.0–17.0)
MCH: 30 pg (ref 26.0–34.0)
MCHC: 33.8 g/dL (ref 30.0–36.0)
MCV: 88.6 fL (ref 80.0–100.0)
Platelets: 161 10*3/uL (ref 150–400)
RBC: 5.1 MIL/uL (ref 4.22–5.81)
RDW: 13.4 % (ref 11.5–15.5)
WBC: 7 10*3/uL (ref 4.0–10.5)
nRBC: 0 % (ref 0.0–0.2)

## 2020-09-10 LAB — TROPONIN I (HIGH SENSITIVITY): Troponin I (High Sensitivity): 9 ng/L (ref <18)

## 2020-09-10 NOTE — ED Triage Notes (Signed)
Patient with L side chest with L arm numbness x 2 hours, states significant cardiac hx

## 2020-09-11 ENCOUNTER — Inpatient Hospital Stay (HOSPITAL_COMMUNITY): Payer: Medicare HMO

## 2020-09-11 ENCOUNTER — Encounter (HOSPITAL_COMMUNITY): Payer: Self-pay | Admitting: Emergency Medicine

## 2020-09-11 DIAGNOSIS — R001 Bradycardia, unspecified: Secondary | ICD-10-CM | POA: Diagnosis not present

## 2020-09-11 DIAGNOSIS — Z7982 Long term (current) use of aspirin: Secondary | ICD-10-CM | POA: Diagnosis not present

## 2020-09-11 DIAGNOSIS — I214 Non-ST elevation (NSTEMI) myocardial infarction: Secondary | ICD-10-CM

## 2020-09-11 DIAGNOSIS — I5022 Chronic systolic (congestive) heart failure: Secondary | ICD-10-CM | POA: Diagnosis not present

## 2020-09-11 DIAGNOSIS — Z8249 Family history of ischemic heart disease and other diseases of the circulatory system: Secondary | ICD-10-CM | POA: Diagnosis not present

## 2020-09-11 DIAGNOSIS — Z20822 Contact with and (suspected) exposure to covid-19: Secondary | ICD-10-CM | POA: Diagnosis not present

## 2020-09-11 DIAGNOSIS — I25710 Atherosclerosis of autologous vein coronary artery bypass graft(s) with unstable angina pectoris: Secondary | ICD-10-CM | POA: Diagnosis not present

## 2020-09-11 DIAGNOSIS — Z6835 Body mass index (BMI) 35.0-35.9, adult: Secondary | ICD-10-CM | POA: Diagnosis not present

## 2020-09-11 DIAGNOSIS — I2582 Chronic total occlusion of coronary artery: Secondary | ICD-10-CM | POA: Diagnosis present

## 2020-09-11 DIAGNOSIS — E785 Hyperlipidemia, unspecified: Secondary | ICD-10-CM | POA: Diagnosis present

## 2020-09-11 DIAGNOSIS — Z79899 Other long term (current) drug therapy: Secondary | ICD-10-CM | POA: Diagnosis not present

## 2020-09-11 DIAGNOSIS — I428 Other cardiomyopathies: Secondary | ICD-10-CM | POA: Diagnosis not present

## 2020-09-11 DIAGNOSIS — R200 Anesthesia of skin: Secondary | ICD-10-CM | POA: Diagnosis not present

## 2020-09-11 DIAGNOSIS — I200 Unstable angina: Secondary | ICD-10-CM | POA: Diagnosis not present

## 2020-09-11 DIAGNOSIS — I248 Other forms of acute ischemic heart disease: Secondary | ICD-10-CM | POA: Diagnosis not present

## 2020-09-11 DIAGNOSIS — Z87891 Personal history of nicotine dependence: Secondary | ICD-10-CM | POA: Diagnosis not present

## 2020-09-11 DIAGNOSIS — I25110 Atherosclerotic heart disease of native coronary artery with unstable angina pectoris: Secondary | ICD-10-CM | POA: Diagnosis not present

## 2020-09-11 DIAGNOSIS — E669 Obesity, unspecified: Secondary | ICD-10-CM | POA: Diagnosis not present

## 2020-09-11 DIAGNOSIS — R7303 Prediabetes: Secondary | ICD-10-CM | POA: Diagnosis present

## 2020-09-11 DIAGNOSIS — Z951 Presence of aortocoronary bypass graft: Secondary | ICD-10-CM | POA: Diagnosis not present

## 2020-09-11 DIAGNOSIS — I959 Hypotension, unspecified: Secondary | ICD-10-CM | POA: Diagnosis not present

## 2020-09-11 DIAGNOSIS — I252 Old myocardial infarction: Secondary | ICD-10-CM | POA: Diagnosis not present

## 2020-09-11 DIAGNOSIS — R079 Chest pain, unspecified: Secondary | ICD-10-CM | POA: Diagnosis not present

## 2020-09-11 DIAGNOSIS — Z833 Family history of diabetes mellitus: Secondary | ICD-10-CM | POA: Diagnosis not present

## 2020-09-11 DIAGNOSIS — I110 Hypertensive heart disease with heart failure: Secondary | ICD-10-CM | POA: Diagnosis not present

## 2020-09-11 DIAGNOSIS — I69334 Monoplegia of upper limb following cerebral infarction affecting left non-dominant side: Secondary | ICD-10-CM | POA: Diagnosis not present

## 2020-09-11 DIAGNOSIS — H269 Unspecified cataract: Secondary | ICD-10-CM | POA: Diagnosis present

## 2020-09-11 DIAGNOSIS — I255 Ischemic cardiomyopathy: Secondary | ICD-10-CM | POA: Diagnosis present

## 2020-09-11 DIAGNOSIS — Z95810 Presence of automatic (implantable) cardiac defibrillator: Secondary | ICD-10-CM | POA: Diagnosis not present

## 2020-09-11 LAB — ECHOCARDIOGRAM COMPLETE
Area-P 1/2: 3.56 cm2
Height: 72 in
S' Lateral: 4.8 cm
Weight: 4184 oz

## 2020-09-11 LAB — RESP PANEL BY RT-PCR (FLU A&B, COVID) ARPGX2
Influenza A by PCR: NEGATIVE
Influenza B by PCR: NEGATIVE
SARS Coronavirus 2 by RT PCR: NEGATIVE

## 2020-09-11 LAB — TROPONIN I (HIGH SENSITIVITY): Troponin I (High Sensitivity): 38 ng/L — ABNORMAL HIGH (ref <18)

## 2020-09-11 LAB — HEPARIN LEVEL (UNFRACTIONATED)
Heparin Unfractionated: 0.71 IU/mL — ABNORMAL HIGH (ref 0.30–0.70)
Heparin Unfractionated: 0.56 IU/mL (ref 0.30–0.70)

## 2020-09-11 MED ORDER — FUROSEMIDE 20 MG PO TABS
20.0000 mg | ORAL_TABLET | Freq: Every day | ORAL | Status: DC
  Administered 2020-09-12 – 2020-09-14 (×3): 20 mg via ORAL
  Filled 2020-09-11 (×4): qty 1

## 2020-09-11 MED ORDER — NITROGLYCERIN 0.4 MG SL SUBL: 0.4000 mg | SUBLINGUAL_TABLET | SUBLINGUAL | Status: DC | PRN

## 2020-09-11 MED ORDER — ALUM & MAG HYDROXIDE-SIMETH 200-200-20 MG/5ML PO SUSP
30.0000 mL | Freq: Once | ORAL | Status: AC
  Administered 2020-09-11: 30 mL via ORAL
  Filled 2020-09-11: qty 30

## 2020-09-11 MED ORDER — ACETAMINOPHEN 325 MG PO TABS
650.0000 mg | ORAL_TABLET | ORAL | Status: DC | PRN
  Administered 2020-09-11: 650 mg via ORAL
  Filled 2020-09-11: qty 2

## 2020-09-11 MED ORDER — LATANOPROST 0.005 % OP SOLN
1.0000 [drp] | Freq: Every day | OPHTHALMIC | Status: DC
  Administered 2020-09-11 – 2020-09-13 (×3): 1 [drp] via OPHTHALMIC
  Filled 2020-09-11: qty 2.5

## 2020-09-11 MED ORDER — ASPIRIN 81 MG PO CHEW
81.0000 mg | CHEWABLE_TABLET | Freq: Every day | ORAL | Status: DC
  Administered 2020-09-11 – 2020-09-14 (×4): 81 mg via ORAL
  Filled 2020-09-11 (×4): qty 1

## 2020-09-11 MED ORDER — ASPIRIN 81 MG PO CHEW
324.0000 mg | CHEWABLE_TABLET | Freq: Once | ORAL | Status: AC
  Administered 2020-09-11: 324 mg via ORAL
  Filled 2020-09-11: qty 4

## 2020-09-11 MED ORDER — ONDANSETRON HCL 4 MG/2ML IJ SOLN: 4.0000 mg | Freq: Four times a day (QID) | INTRAMUSCULAR | Status: DC | PRN

## 2020-09-11 MED ORDER — BRIMONIDINE TARTRATE 0.15 % OP SOLN
1.0000 [drp] | Freq: Two times a day (BID) | OPHTHALMIC | Status: DC
  Administered 2020-09-11 – 2020-09-14 (×7): 1 [drp] via OPHTHALMIC
  Filled 2020-09-11: qty 5

## 2020-09-11 MED ORDER — GABAPENTIN 300 MG PO CAPS
300.0000 mg | ORAL_CAPSULE | Freq: Every day | ORAL | Status: DC
  Administered 2020-09-11 – 2020-09-13 (×3): 300 mg via ORAL
  Filled 2020-09-11 (×3): qty 1

## 2020-09-11 MED ORDER — SPIRONOLACTONE 25 MG PO TABS
25.0000 mg | ORAL_TABLET | Freq: Every day | ORAL | Status: DC
  Administered 2020-09-12 – 2020-09-14 (×3): 25 mg via ORAL
  Filled 2020-09-11 (×4): qty 1

## 2020-09-11 MED ORDER — HEPARIN BOLUS VIA INFUSION
4000.0000 [IU] | Freq: Once | INTRAVENOUS | Status: AC
  Administered 2020-09-11: 4000 [IU] via INTRAVENOUS
  Filled 2020-09-11: qty 4000

## 2020-09-11 MED ORDER — SACUBITRIL-VALSARTAN 97-103 MG PO TABS
1.0000 | ORAL_TABLET | Freq: Two times a day (BID) | ORAL | Status: DC
  Administered 2020-09-12 – 2020-09-14 (×5): 1 via ORAL
  Filled 2020-09-11 (×8): qty 1

## 2020-09-11 MED ORDER — HEPARIN (PORCINE) 25000 UT/250ML-% IV SOLN
1300.0000 [IU]/h | INTRAVENOUS | Status: DC
  Administered 2020-09-11: 1400 [IU]/h via INTRAVENOUS
  Administered 2020-09-12 (×2): 1300 [IU]/h via INTRAVENOUS
  Filled 2020-09-11 (×3): qty 250

## 2020-09-11 MED ORDER — PERFLUTREN LIPID MICROSPHERE
1.0000 mL | INTRAVENOUS | Status: AC | PRN
  Administered 2020-09-11: 2 mL via INTRAVENOUS
  Filled 2020-09-11: qty 10

## 2020-09-11 MED ORDER — ATORVASTATIN CALCIUM 80 MG PO TABS
80.0000 mg | ORAL_TABLET | Freq: Every day | ORAL | Status: DC
  Administered 2020-09-11 – 2020-09-14 (×4): 80 mg via ORAL
  Filled 2020-09-11 (×4): qty 1

## 2020-09-11 MED ORDER — CARVEDILOL 25 MG PO TABS
25.0000 mg | ORAL_TABLET | Freq: Two times a day (BID) | ORAL | Status: DC
  Administered 2020-09-12 – 2020-09-14 (×2): 25 mg via ORAL
  Filled 2020-09-11 (×5): qty 1

## 2020-09-11 MED ORDER — NITROGLYCERIN 2 % TD OINT
0.5000 [in_us] | TOPICAL_OINTMENT | Freq: Once | TRANSDERMAL | Status: AC
  Administered 2020-09-11: 0.5 [in_us] via TOPICAL
  Filled 2020-09-11: qty 1

## 2020-09-11 NOTE — Progress Notes (Signed)
ANTICOAGULATION CONSULT NOTE - Follow Up Consult  Pharmacy Consult for Heparin Indication: chest pain/ACS  No Known Allergies  Patient Measurements: Height: 6' (182.9 cm) Weight: 118.6 kg (261 lb 8 oz) IBW/kg (Calculated) : 77.6 kg Heparin Dosing Weight: 104.9 kg  Vital Signs: Temp: 98.4 F (36.9 C) (05/29 1948) Temp Source: Oral (05/29 1948) BP: 118/74 (05/29 1948) Pulse Rate: 63 (05/29 1948)  Labs: Recent Labs    09/10/20 2216 09/11/20 0016 09/11/20 1129 09/11/20 1920  HGB 15.3  --   --   --   HCT 45.2  --   --   --   PLT 161  --   --   --   HEPARINUNFRC  --   --  0.71* 0.56  CREATININE 1.18  --   --   --   TROPONINIHS 9 38*  --   --     Estimated Creatinine Clearance: 89.6 mL/min (by C-G formula based on SCr of 1.18 mg/dL).   Assessment: 60 yo male presents with a history of CAD s/p CABG 2014, ischemic CM, HFrEF s/p ICD, HTN, and HLD presents with chest pain and found to have an NSTEMI. PTA the patient is not on anticoagulation. The patient is planned for cardiac cath on 5/31. Pharmacy is consulted to dose heparin.  Heparin level came back therapeutic at 0.56, on 1300 units/hr. CBC stable. No s/sx of bleeding or infusion issues.   Goal of Therapy:  Heparin level 0.3-0.7 units/ml Monitor platelets by anticoagulation protocol: Yes   Plan:  -Continue heparin at to 1300 units/hr -Obtain a daily heparin level and CBC -Monitor for signs and symptoms of bleeding  Antonietta Jewel, PharmD, [place] Pharmacist  Phone: [phone] 09/11/2020 8:27 PM  Please check AMION for all [place] phone numbers After 10:00 PM, call [place] [phone]

## 2020-09-11 NOTE — Progress Notes (Signed)
   09/11/20 1126  Assess: MEWS Score  Temp 98.8 F (37.1 C)  BP 95/62  Pulse Rate (!) 47  ECG Heart Rate (!) 47  Resp 19  SpO2 96 %  O2 Device Room Air  Assess: MEWS Score  MEWS Temp 0  MEWS Systolic 1  MEWS Pulse 1  MEWS RR 0  MEWS LOC 0  MEWS Score 2  MEWS Score Color Yellow  Assess: if the MEWS score is Yellow or Red  Were vital signs taken at a resting state? Yes  Focused Assessment No change from prior assessment  Early Detection of Sepsis Score *See Row Information* Low  MEWS guidelines implemented *See Row Information* Yes  Take Vital Signs  Increase Vital Sign Frequency  Yellow: Q 2hr X 2 then Q 4hr X 2, if remains yellow, continue Q 4hrs  Escalate  MEWS: Escalate Yellow: discuss with charge nurse/RN and consider discussing with provider and RRT  Notify: Charge Nurse/RN  Name of Charge Nurse/RN Notified Jessica, RN  Date Charge Nurse/RN Notified 09/11/20  Time Charge Nurse/RN Notified 1200  Notify: Provider  Provider Name/Title Lanna Poche  Date Provider Notified 09/11/20  Time Provider Notified 1312  Notification Type  (Secure chat)  Notification Reason Change in status  Provider response  (Awaiting orders)  Document  Patient Outcome Other (Comment) (Observation)  Progress note created (see row info) Yes

## 2020-09-11 NOTE — H&P (Signed)
Cardiology Admission History and Physical:   Patient ID: [name] MRN: [id number]; DOB: 1960/10/26   Admission date: 09/10/2020  Primary Care Provider: Ladell Pier, MD Primary Cardiologist: Peter Martinique, MD  Primary Electrophysiologist:  None   Chief Complaint:  Chest pain  Patient Profile:   [name] is a 60 y.o. male with CAD s/p CABG 2014, ischemic CM, HFrEF (25%) s/p ICD, HTN, HLD who presents with chest pain.  History of Present Illness:   [name] was at a family reunion yesterday and had eaten many foods that he states he was told not to eat because of his cardiac history. He began to feel some L sided chest pain and L arm numbness that he had never felt before. These symptoms were slightly worsened with exertion and were persistent. He had no other associated symptoms. He denies any recent medication non-adherence, leg swelling, orthopnea, PND, ICD discharges, palpitations. He was brought to the ED for evaluation.   In the ED, the patient's vital signs were all within normal limits. His ECG revealed scattered T wave inversions that were similar to multiple prior ECGs. His initial troponin was 9 then follow up troponin 38. He remained chest pain free during his time in the ED. He was admitted for further treatment.   Heart Pathway Score:     Past Medical History:  Diagnosis Date  . Abnormal EKG    hx of ischemia showing up on ekg's  . Acute myopericarditis    a. 03/2013: readm with hypoxia, tachycardia, elevated ESR/CRP, elevated troponin with Dressler syndrome and myopericarditis; treated with steroids.  . Acute respiratory failure ([place])    a. 01/2013: VDRF. b. 03/2013: hypoxia requiring supp O2 during adm, resolved by discharge.  . C. difficile colitis    a. 01/2013 during prolonged adm. b. Recurred 03/2013.  . Cardiac tamponade    a. 01/2013 s/p drain.  . Cataracts, bilateral   . Coronary artery disease    a. s/p MI in 2009 in [place] with stenting  of the LCX and LAD;  b. 12/2012 NSTEMI/CAD: LM nl, LAD patent prox stent, LCX 50-70 isr (FFR 0.84), RCA dom, 129m EF 40-45%-->Med Rx. c. 01/2013: anterolateral STEMI complicated by pericardial effusion (presumed purulent pericarditis) and tamponade s/p drain, ruptured LV pseudoaneurysm s/p CorMatrix patch, CABGx1 (SVG-OM1), fever, CVA, VDRF, C diff.  . CVA (cerebral infarction)    a. 01/2013 in setting of prolonged hospitalization - residual L arm weakness.  . [place]    a. 03/2013: readm with hypoxia, tachycardia, elevated ESR/CRP, elevated troponin with Dressler syndrome and myopericarditis; treated with steroids.  . Glaucoma   . Hyperlipidemia   . Hypokalemia   . Hyponatremia    a. During late 2014.  . Ischemic cardiomyopathy    a. Sept/Oct 2014: EF ~40% (ICM). b. 03/2013: EF 25-30%. Off ACEI due to hypotension (MIXED NICM/ICM).  .Marland KitchenOptic neuropathy, ischemic   . Pericardial effusion    a. 01/2013: pericardial effusion (presumed purulent pericarditis) and cardiac tamponade s/p drain. b. Persistent moderate pericardial effusion 03/2013.  . Pre-diabetes   . Pseudoaneurysm of left ventricle of heart    a. 01/2013:  Ruptured inferoposterior LV pseudoaneurysm s/p CorMatrix patch.  . Sinus bradycardia    asymptomatic  . Stroke ([facility] 2009   weak lt side-lt arm  . Tobacco abuse   . Wears dentures    top    Past Surgical History:  Procedure Laterality Date  . CARDIAC CATHETERIZATION  01/06/2013  .  COLONOSCOPY WITH PROPOFOL N/A 07/03/2016   Procedure: COLONOSCOPY WITH PROPOFOL;  Surgeon: Manus Gunning, MD;  Location: [facility];  Service: Gastroenterology;  Laterality: N/A;  . CORONARY ANGIOPLASTY WITH STENT PLACEMENT  2000's X 2   "1 + 1" (01/06/2013)  . CORONARY ARTERY BYPASS GRAFT N/A 01/28/2013   Procedure: CORONARY ARTERY BYPASS GRAFTING (CABG);  Surgeon: Melrose Nakayama, MD;  Location: [place];  Service: Open Heart Surgery;  Laterality: N/A;  CABG x one,  using left greater saphenous vein harvested endoscopically  . ICD IMPLANT N/A 05/25/2020   Procedure: ICD IMPLANT;  Surgeon: Thompson Grayer, MD;  Location: [place] CV LAB;  Service: Cardiovascular;  Laterality: N/A;  . INTRAOPERATIVE TRANSESOPHAGEAL ECHOCARDIOGRAM N/A 01/28/2013   Procedure: INTRAOPERATIVE TRANSESOPHAGEAL ECHOCARDIOGRAM;  Surgeon: Melrose Nakayama, MD;  Location: [place];  Service: Open Heart Surgery;  Laterality: N/A;  . LEFT HEART CATHETERIZATION WITH CORONARY ANGIOGRAM N/A 01/06/2013   Procedure: LEFT HEART CATHETERIZATION WITH CORONARY ANGIOGRAM;  Surgeon: Peter M Martinique, MD;  Location: [facility] CATH LAB;  Service: Cardiovascular;  Laterality: N/A;  . LEFT HEART CATHETERIZATION WITH CORONARY ANGIOGRAM N/A 01/21/2013   Procedure: LEFT HEART CATHETERIZATION WITH CORONARY ANGIOGRAM;  Surgeon: Burnell Blanks, MD;  Location: [facility] CATH LAB;  Service: Cardiovascular;  Laterality: N/A;  . LUMBAR LAMINECTOMY/ DECOMPRESSION WITH MET-RX Right 06/30/2018   Procedure: Right minimally invasive Lumbar Three-Four Far lateral discectomy;  Surgeon: Judith [facility], MD;  Location: [place];  Service: Neurosurgery;  Laterality: Right;  Right minimally invasive Lumbar Three-Four Far lateral discectomy  . MASS EXCISION Right 07/21/2014   Procedure: EXCISION RIGHT NECK MASS;  Surgeon: Erroll Luna, MD;  Location: [place];  Service: General;  Laterality: Right;  . PERICARDIAL TAP N/A 01/24/2013   Procedure: PERICARDIAL TAP;  Surgeon: Blane Ohara, MD;  Location: [facility] CATH LAB;  Service: Cardiovascular;  Laterality: N/A;  . RIGHT HEART CATHETERIZATION Right 01/24/2013   Procedure: RIGHT HEART CATH;  Surgeon: Blane Ohara, MD;  Location: [facility] CATH LAB;  Service: Cardiovascular;  Laterality: Right;  . VENTRICULAR ANEURYSM RESECTION N/A 01/28/2013   Procedure: LEFT VENTRICULAR ANEURYSM REPAIR;  Surgeon: Melrose Nakayama, MD;  Location: [place];  Service: Open Heart Surgery;   Laterality: N/A;     Medications Prior to Admission: Prior to Admission medications   Medication Sig Start Date End Date Taking? Authorizing Provider  ALPHAGAN P 0.1 % SOLN INSTILL 1 DROP INTO BOTH EYES TWICE A DAY Patient taking differently: Place 1 drop into both eyes 2 (two) times daily. 10/26/19  Yes Ladell Pier, MD  aspirin 81 MG EC tablet Take 1 tablet (81 mg total) by mouth daily. Restart on 07/05/2018 Patient taking differently: Take 81 mg by mouth daily. 06/30/18  Yes Judith [facility], MD  atorvastatin (LIPITOR) 80 MG tablet TAKE 1 TABLET BY MOUTH EVERY DAY Patient taking differently: Take 80 mg by mouth daily. 04/21/20  Yes Ladell Pier, MD  carvedilol (COREG) 25 MG tablet TAKE 1 TABLET BY MOUTH TWICE A DAY WITH A MEAL Patient taking differently: Take 25 mg by mouth 2 (two) times daily with a meal. 06/20/20  Yes Martinique, Peter M, MD  Cholecalciferol (VITAMIN D) 125 MCG (5000 UT) CAPS Take 5,000 Units by mouth daily.   Yes [provider]  ENTRESTO 97-103 MG TAKE 1 TABLET BY MOUTH TWICE A DAY Patient taking differently: Take 1 tablet by mouth 2 (two) times daily. 05/05/20  Yes Martinique, Peter M, MD  furosemide (LASIX) 20 MG tablet TAKE 1 TABLET BY MOUTH EVERY DAY Patient taking differently: Take 20 mg by mouth daily. 03/23/20  Yes Ladell Pier, MD  gabapentin (NEURONTIN) 300 MG capsule TAKE 1 CAPSULE BY MOUTH EVERYDAY AT BEDTIME Patient taking differently: Take 300 mg by mouth at bedtime. 05/07/20  Yes Ladell Pier, MD  latanoprost (XALATAN) 0.005 % ophthalmic solution Place 1 drop into both eyes at bedtime. 05/31/16  Yes [provider]  methocarbamol (ROBAXIN) 500 MG tablet TAKE 1 TABLET (500 MG TOTAL) BY MOUTH 2 (TWO) TIMES DAILY AS NEEDED FOR MUSCLE SPASMS. 09/07/20  Yes Ladell Pier, MD  Multiple Vitamin (MULTIVITAMIN WITH MINERALS) TABS tablet Take 1 tablet by mouth daily. Centrum Silver   Yes [provider]  spironolactone  (ALDACTONE) 25 MG tablet Take 1 tablet (25 mg total) by mouth daily. 04/22/20 07/21/20 Yes Ladell Pier, MD  traMADol (ULTRAM) 50 MG tablet Take 1 tablet (50 mg total) by mouth every 8 (eight) hours as needed. Patient taking differently: Take 50 mg by mouth every 8 (eight) hours as needed for moderate pain. 07/21/20  Yes Ladell Pier, MD  carvedilol (COREG) 25 MG tablet Take 1 tablet (25 mg total) by mouth 2 (two) times daily with a meal. 06/19/20   Tommie Raymond, NP  empagliflozin (JARDIANCE) 10 MG TABS tablet Take 1 tablet (10 mg total) by mouth daily before breakfast. Patient not taking: Reported on 09/11/2020 06/24/20   Martinique, Peter M, MD  nicotine (NICODERM CQ - DOSED IN MG/24 HOURS) 21 mg/24hr patch Place 1 patch (21 mg total) onto the skin daily. Patient not taking: Reported on 09/11/2020 07/21/20   Ladell Pier, MD     Allergies:   No Known Allergies  Social History:   Social History   Socioeconomic History  . Marital status: Single    Spouse name: Not on file  . Number of children: 2  . Years of education: 12th  . Highest education level: Not on file  Occupational History  . Occupation: N/A  Tobacco Use  . Smoking status: Current Every Day Smoker    Packs/day: 1.00    Years: 35.00    Pack years: 35.00    Types: Cigarettes  . Smokeless tobacco: Never Used  Vaping Use  . Vaping Use: Never used  Substance and Sexual Activity  . Alcohol use: No    Alcohol/week: 0.0 standard drinks  . Drug use: No  . Sexual activity: Yes  Other Topics Concern  . Not on file  Social History Narrative   Lives in [place] with wife.  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  He is now retired on disability.  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      Caffeine Use: very rarely   Social Determinants of Radio broadcast assistant Strain: Not on file  Food Insecurity: Not on file  Transportation Needs: Not on file  Physical Activity: Not on file  Stress: Not on  file  Social Connections: Not on file  Intimate Partner Violence: Not on file    Family History:   The patient's family history includes CAD in an other family member; Diabetes in his mother; HIV in his brother; Heart attack in his mother; Hypertension in his father and mother. There is no history of Stroke.    ROS:  Please see the history of present illness.  All other ROS reviewed and negative.  Physical Exam/Data:   Vitals:   09/10/20 2357 09/11/20 0233 09/11/20 0234 09/11/20 0238  BP: 109/77 132/77    Pulse: 66 (!) 57 (!) 57   Resp: _0 Temp: 98.4 F (36.9 C) 98 F (36.7 C)  98 F (36.7 C)  TempSrc: Oral Oral  Oral  SpO2: 97% 100% 99%   Weight:      Height:       No intake or output data in the 24 hours ending 09/11/20 0422 Last 3 Weights 09/10/2020 08/26/2020 07/21/2020  Weight (lbs) 257 lb 265 lb 9.6 oz 264 lb 3.2 oz  Weight (kg) 116.574 kg 120.475 kg 119.84 kg     Body mass index is 33.91 kg/m.  General:  Well nourished, well developed, in no acute distress HEENT: normal Neck: no JVD Endocrine:  No thryomegaly Cardiac:  normal S1, S2; RRR; no murmur  Lungs:  clear to auscultation bilaterally, no wheezing, rhonchi or rales  Abd: soft, nontender, no hepatomegaly  Ext: no edema Musculoskeletal:  No deformities, BUE and BLE strength normal and equal Skin: warm and dry  Neuro:  CNs 2-12 intact, no focal abnormalities noted Psych:  Normal affect    EKG:  The ECG that was done on arrival to the ED was personally reviewed and demonstrates NSR, HR 71, inferior Q waves, scattered T wave inversions, no acute ST or T wave changes  Relevant CV Studies: 06/29/19 Echo 1. Difficult acoustic windows. LVEF is severely depressed at  approximately 20 to 25% with akinesis of the inferior, and posterior  walls; hypokinesis elsewhere.   Compared to previous echo report, no significant change. . Left  ventricular ejection fraction, by estimation, is 20 to 25%. The left   ventricle has severely decreased function. The left ventricle demonstrates  regional wall motion abnormalities (see  scoring diagram/findings for description). The left ventricular internal  cavity size was mildly dilated. Left ventricular diastolic parameters are  consistent with Grade I diastolic dysfunction (impaired relaxation).  2. Difficult to see RV free wall . Right ventricular systolic function is  mildly reduced. The right ventricular size is normal.   Laboratory Data:  High Sensitivity Troponin:   Recent Labs  Lab 09/10/20 2216 09/11/20 0016  TROPONINIHS 9 38*      Chemistry Recent Labs  Lab 09/10/20 2216  NA 141  K 4.3  CL 105  CO2 26  GLUCOSE 99  BUN 14  CREATININE 1.18  CALCIUM 9.6  GFRNONAA >60  ANIONGAP 10    No results for input(s): PROT, ALBUMIN, AST, ALT, ALKPHOS, BILITOT in the last 168 hours. Hematology Recent Labs  Lab 09/10/20 2216  WBC 7.0  RBC 5.10  HGB 15.3  HCT 45.2  MCV 88.6  MCH 30.0  MCHC 33.8  RDW 13.4  PLT 161   BNPNo results for input(s): BNP, PROBNP in the last 168 hours.  DDimer No results for input(s): DDIMER in the last 168 hours.   Radiology/Studies:  DG Chest 2 View  Result Date: 09/10/2020 CLINICAL DATA:  Chest pain, history of coronary artery disease, left-sided chest pain and left arm numbness for 2 hours EXAM: CHEST - 2 VIEW COMPARISON:  06/07/2020 FINDINGS: Frontal and lateral views of the chest demonstrates single lead pacer/AICD unchanged. Cardiac silhouette is stable, with postsurgical changes from prior median sternotomy. No airspace disease, effusion, or pneumothorax. No acute bony abnormalities. IMPRESSION: 1. Stable exam, no acute process. Electronically Signed   By: Randa Ngo M.D.   On:  09/10/2020 22:34      TIMI Risk Score for Unstable Angina or Non-ST Elevation MI:   The patient's TIMI risk score is 4, which indicates a 20% risk of all cause mortality, new or recurrent myocardial infarction or need  for urgent revascularization in the next 14 days.   Assessment and Plan:  [name] is a 60 y.o. male with CAD s/p CABG 2014, ischemic CM, HFrEF (25%) s/p ICD, HTN, HLD who presents with chest pain, found to rule in for MI.  #) NSTEMI: although patient's troponin elevation is minimal, the fact that he has known severe CAD and that his symptoms were largely unprovoked is suggestive of a type 1 ACS event.  - echo in AM - check lipids, A1c - ASA 338m then 875mdaily - heparin drip for ACS per pharmacy protocol - cont home atorvastatin 8070mHS - cont home entresto, carvedilol  - SLN, nitro gtt PRN - defer P2Y12 until after cath - cardiac rehab  #) HFrEF: no signs of acute decompensated HF on exam - cont home carvedilol, spironolactone, entresto - was previously prescribed jardiance but no longer taking this, would be worth looking into this - cont home lasix once daily PO   Severity of Illness: The appropriate patient status for this patient is INPATIENT. Inpatient status is judged to be reasonable and necessary in order to provide the required intensity of service to ensure the patient's safety. The patient's presenting symptoms, physical exam findings, and initial radiographic and laboratory data in the context of their chronic comorbidities is felt to place them at high risk for further clinical deterioration. Furthermore, it is not anticipated that the patient will be medically stable for discharge from the hospital within 2 midnights of admission. The following factors support the patient status of inpatient.   " The patient's presenting symptoms include chest pain. " The worrisome physical exam findings include n/a. " The initial radiographic and laboratory data are worrisome because of elevated troponin. " The chronic co-morbidities include CAD, HFrEF.   * I certify that at the point of admission it is my clinical judgment that the patient will require inpatient hospital  care spanning beyond 2 midnights from the point of admission due to high intensity of service, high risk for further deterioration and high frequency of surveillance required.*    For questions or updates, please contact [facility] consult [email or web address] for contact info under        Signed, AntMarcie MowersD  09/11/2020 4:22 AM

## 2020-09-11 NOTE — Progress Notes (Addendum)
Progress Note  Patient Name: [name] Date of Encounter: 09/11/2020  [facility] Cardiologist: Peter Martinique, MD   Subjective   Hypotensive this morning.  Medications are being held.  Currently resting comfortably, easily arousable.  Very pleasant.  Does not appear to be short of breath.  His chest pain has resolved.  On IV heparin.  Inpatient Medications    Scheduled Meds: . aspirin  81 mg Oral Daily  . atorvastatin  80 mg Oral Daily  . brimonidine  1 drop Both Eyes BID  . carvedilol  25 mg Oral BID WC  . furosemide  20 mg Oral Daily  . gabapentin  300 mg Oral QHS  . latanoprost  1 drop Both Eyes QHS  . sacubitril-valsartan  1 tablet Oral BID  . spironolactone  25 mg Oral Daily   Continuous Infusions: . heparin 1,400 Units/hr (09/11/20 0604)   PRN Meds: acetaminophen, nitroGLYCERIN, ondansetron (ZOFRAN) IV   Vital Signs    Vitals:   09/11/20 0515 09/11/20 0634 09/11/20 0750 09/11/20 0913  BP: 103/73 124/78 (!) 85/74 (!) 89/48  Pulse: (!) 51 66 60 60  Resp: 18 15 16 17   Temp:  98.1 F (36.7 C) 98 F (36.7 C)   TempSrc:  Oral Oral   SpO2: 94% 100% 98% 98%  Weight:  118.6 kg    Height:  6' (1.829 m)     No intake or output data in the 24 hours ending 09/11/20 1037 Last 3 Weights 09/11/2020 09/10/2020 08/26/2020  Weight (lbs) 261 lb 8 oz 257 lb 265 lb 9.6 oz  Weight (kg) 118.616 kg 116.574 kg 120.475 kg      Physical Exam   GEN: No acute distress.   Neck: No JVD Cardiac: RRR, no murmurs, rubs, or gallops.  Respiratory: Clear to auscultation bilaterally. GI: Soft, nontender, non-distended  MS: No edema; No deformity. Neuro:  Nonfocal  Psych: Normal affect   Labs    High Sensitivity Troponin:   Recent Labs  Lab 09/10/20 2216 09/11/20 0016  TROPONINIHS 9 38*      Chemistry Recent Labs  Lab 09/10/20 2216  NA 141  K 4.3  CL 105  CO2 26  GLUCOSE 99  BUN 14  CREATININE 1.18  CALCIUM 9.6  GFRNONAA >60  ANIONGAP 10      Hematology Recent Labs  Lab 09/10/20 2216  WBC 7.0  RBC 5.10  HGB 15.3  HCT 45.2  MCV 88.6  MCH 30.0  MCHC 33.8  RDW 13.4  PLT 161    BNPNo results for input(s): BNP, PROBNP in the last 168 hours.   DDimer No results for input(s): DDIMER in the last 168 hours.   Radiology    DG Chest 2 View  Result Date: 09/10/2020 CLINICAL DATA:  Chest pain, history of coronary artery disease, left-sided chest pain and left arm numbness for 2 hours EXAM: CHEST - 2 VIEW COMPARISON:  06/07/2020 FINDINGS: Frontal and lateral views of the chest demonstrates single lead pacer/AICD unchanged. Cardiac silhouette is stable, with postsurgical changes from prior median sternotomy. No airspace disease, effusion, or pneumothorax. No acute bony abnormalities. IMPRESSION: 1. Stable exam, no acute process. Electronically Signed   By: Randa Ngo M.D.   On: 09/10/2020 22:34     Patient Profile     60 y.o. male with coronary disease post CABG 2014 with ischemic cardiomyopathy EF 25% status post ICD on goal-directed therapy here with chest pain.  Assessment & Plan    Chest  discomfort with CAD post CABG 2014, NSTEMI - Left-sided left arm numbness never felt this before.  EKG showed scattered T wave inversions similar to multiple prior EKGs.  Initial troponin was 9, secondary troponin was 38.   -Awaiting cardiac catheterization Tuesday. -Checking echocardiogram -Heparin drip  Chronic systolic heart failure with ischemic cardiomyopathy EF 25% - ICD in place.  Blood pressure soft this morning.  Holding carvedilol, Entresto.  89/48.  Creatinine 1.1 hemoglobin 15.3.  Blood pressure currently 314 systolic.    For questions or updates, please contact [place] Please consult [email or web address] for contact info under        Signed, Candee Furbish, MD  09/11/2020, 10:37 AM

## 2020-09-11 NOTE — ED Provider Notes (Signed)
[name] DE[facility]MENT Provider Note   CSN: [id number] Arrival date & time: 09/10/20  2207     History Chief Complaint  Patient presents with  . Chest Pain    [name] is a 60 y.o. male.  The history is provided by the patient.  Chest Pain Pain location:  L chest Pain quality: dull   Pain radiates to:  Does not radiate Pain severity:  Moderate Onset quality:  Sudden Duration:  2 hours Timing:  Constant Progression:  Resolved Chronicity:  New Context: at rest   Relieved by:  Nothing Worsened by:  Nothing Ineffective treatments:  None tried Associated symptoms: no abdominal pain, no anorexia, no anxiety, no back pain, no claudication, no cough, no diaphoresis, no dizziness, no dysphagia, no fatigue, no fever, no headache, no heartburn, no lower extremity edema, no nausea, no near-syncope, no numbness, no orthopnea, no palpitations, no PND, no shortness of breath, no syncope, no vomiting and no weakness   Risk factors: coronary artery disease, diabetes mellitus and male sex   Patient with CAD and AICD s/p CABG was at a cook out today and "ate foods I shouldn't have eaten" including burgers and hot dogs and baked beans and got home and had sudden onset left side CP.  He denies radiation to me or associated symptoms.       Past Medical History:  Diagnosis Date  . Abnormal EKG    hx of ischemia showing up on ekg's  . Acute myopericarditis    a. 03/2013: readm with hypoxia, tachycardia, elevated ESR/CRP, elevated troponin with Dressler syndrome and myopericarditis; treated with steroids.  . Acute respiratory failure ([place])    a. 01/2013: VDRF. b. 03/2013: hypoxia requiring supp O2 during adm, resolved by discharge.  . C. difficile colitis    a. 01/2013 during prolonged adm. b. Recurred 03/2013.  . Cardiac tamponade    a. 01/2013 s/p drain.  . Cataracts, bilateral   . Coronary artery disease    a. s/p MI in 2009 in [place] with stenting of the  LCX and LAD;  b. 12/2012 NSTEMI/CAD: LM nl, LAD patent prox stent, LCX 50-70 isr (FFR 0.84), RCA dom, 168m EF 40-45%-->Med Rx. c. 01/2013: anterolateral STEMI complicated by pericardial effusion (presumed purulent pericarditis) and tamponade s/p drain, ruptured LV pseudoaneurysm s/p CorMatrix patch, CABGx1 (SVG-OM1), fever, CVA, VDRF, C diff.  . CVA (cerebral infarction)    a. 01/2013 in setting of prolonged hospitalization - residual L arm weakness.  . Dressler syndrome ([place]    a. 03/2013: readm with hypoxia, tachycardia, elevated ESR/CRP, elevated troponin with Dressler syndrome and myopericarditis; treated with steroids.  . Glaucoma   . Hyperlipidemia   . Hypokalemia   . Hyponatremia    a. During late 2014.  . Ischemic cardiomyopathy    a. Sept/Oct 2014: EF ~40% (ICM). b. 03/2013: EF 25-30%. Off ACEI due to hypotension (MIXED NICM/ICM).  .Marland KitchenOptic neuropathy, ischemic   . Pericardial effusion    a. 01/2013: pericardial effusion (presumed purulent pericarditis) and cardiac tamponade s/p drain. b. Persistent moderate pericardial effusion 03/2013.  . Pre-diabetes   . Pseudoaneurysm of left ventricle of heart    a. 01/2013:  Ruptured inferoposterior LV pseudoaneurysm s/p CorMatrix patch.  . Sinus bradycardia    asymptomatic  . Stroke ([facility] 2009   weak lt side-lt arm  . Tobacco abuse   . Wears dentures    top    Patient Active Problem List   Diagnosis Date Noted  .  Hx of CABG 07/09/2019  . Lumbar radiculopathy 12/04/2018  . Glaucoma 12/04/2018  . Cortical age-related cataract of both eyes 12/04/2018  . Pain management contract agreement 12/04/2018  . Lumbar herniated disc 06/04/2018  . Prediabetes 01/01/2018  . Hyperlipidemia 12/30/2017  . Obesity (BMI 30.0-34.9) 12/30/2017  . Benign neoplasm of descending colon   . Essential hypertension 11/03/2015  . Cardiomyopathy, ischemic 06/03/2015  . Chronic systolic dysfunction of left ventricle 06/03/2015  . Erectile dysfunction due to  arterial insufficiency 12/23/2014  . Lipoma of skin and subcutaneous tissue of neck 05/24/2014  . History of cardioembolic stroke 53/61/4431  . Vision loss of right eye 06/23/2013  . Dyslipidemia 06/22/2013  . Dressler syndrome ([place])   . Coronary artery disease   . Cardiomyopathy ([place])   . Pseudoaneurysm of left ventricle of heart   . Tobacco abuse     Past Surgical History:  Procedure Laterality Date  . CARDIAC CATHETERIZATION  01/06/2013  . COLONOSCOPY WITH PROPOFOL N/A 07/03/2016   Procedure: COLONOSCOPY WITH PROPOFOL;  Surgeon: Manus Gunning, MD;  Location: [facility];  Service: Gastroenterology;  Laterality: N/A;  . CORONARY ANGIOPLASTY WITH STENT PLACEMENT  2000's X 2   "1 + 1" (01/06/2013)  . CORONARY ARTERY BYPASS GRAFT N/A 01/28/2013   Procedure: CORONARY ARTERY BYPASS GRAFTING (CABG);  Surgeon: Melrose Nakayama, MD;  Location: [place];  Service: Open Heart Surgery;  Laterality: N/A;  CABG x one, using left greater saphenous vein harvested endoscopically  . ICD IMPLANT N/A 05/25/2020   Procedure: ICD IMPLANT;  Surgeon: Thompson Grayer, MD;  Location: [place] CV LAB;  Service: Cardiovascular;  Laterality: N/A;  . INTRAOPERATIVE TRANSESOPHAGEAL ECHOCARDIOGRAM N/A 01/28/2013   Procedure: INTRAOPERATIVE TRANSESOPHAGEAL ECHOCARDIOGRAM;  Surgeon: Melrose Nakayama, MD;  Location: [place];  Service: Open Heart Surgery;  Laterality: N/A;  . LEFT HEART CATHETERIZATION WITH CORONARY ANGIOGRAM N/A 01/06/2013   Procedure: LEFT HEART CATHETERIZATION WITH CORONARY ANGIOGRAM;  Surgeon: Peter M Martinique, MD;  Location: [facility] CATH LAB;  Service: Cardiovascular;  Laterality: N/A;  . LEFT HEART CATHETERIZATION WITH CORONARY ANGIOGRAM N/A 01/21/2013   Procedure: LEFT HEART CATHETERIZATION WITH CORONARY ANGIOGRAM;  Surgeon: Burnell Blanks, MD;  Location: [facility] CATH LAB;  Service: Cardiovascular;  Laterality: N/A;  . LUMBAR LAMINECTOMY/ DECOMPRESSION WITH MET-RX Right 06/30/2018   Procedure:  Right minimally invasive Lumbar Three-Four Far lateral discectomy;  Surgeon: Judith Part, MD;  Location: [place];  Service: Neurosurgery;  Laterality: Right;  Right minimally invasive Lumbar Three-Four Far lateral discectomy  . MASS EXCISION Right 07/21/2014   Procedure: EXCISION RIGHT NECK MASS;  Surgeon: Erroll Luna, MD;  Location: [place];  Service: General;  Laterality: Right;  . PERICARDIAL TAP N/A 01/24/2013   Procedure: PERICARDIAL TAP;  Surgeon: Blane Ohara, MD;  Location: [facility] CATH LAB;  Service: Cardiovascular;  Laterality: N/A;  . RIGHT HEART CATHETERIZATION Right 01/24/2013   Procedure: RIGHT HEART CATH;  Surgeon: Blane Ohara, MD;  Location: [facility] CATH LAB;  Service: Cardiovascular;  Laterality: Right;  . VENTRICULAR ANEURYSM RESECTION N/A 01/28/2013   Procedure: LEFT VENTRICULAR ANEURYSM REPAIR;  Surgeon: Melrose Nakayama, MD;  Location: [place];  Service: Open Heart Surgery;  Laterality: N/A;       Family History  Problem Relation Age of Onset  . Heart attack Mother   . Diabetes Mother   . Hypertension Mother   . Hypertension Father   . CAD Other   . HIV Brother   . Stroke Neg  Hx     Social History   Tobacco Use  . Smoking status: Current Every Day Smoker    Packs/day: 1.00    Years: 35.00    Pack years: 35.00    Types: Cigarettes  . Smokeless tobacco: Never Used  Vaping Use  . Vaping Use: Never used  Substance Use Topics  . Alcohol use: No    Alcohol/week: 0.0 standard drinks  . Drug use: No    Home Medications Prior to Admission medications   Medication Sig Start Date End Date Taking? Authorizing Provider  ALPHAGAN P 0.1 % SOLN INSTILL 1 DROP INTO BOTH EYES TWICE A DAY Patient taking differently: Place 1 drop into both eyes 2 (two) times daily. 10/26/19  Yes Ladell Pier, MD  aspirin 81 MG EC tablet Take 1 tablet (81 mg total) by mouth daily. Restart on 07/05/2018 Patient taking differently: Take 81 mg by mouth daily.  06/30/18  Yes Judith Part, MD  atorvastatin (LIPITOR) 80 MG tablet TAKE 1 TABLET BY MOUTH EVERY DAY Patient taking differently: Take 80 mg by mouth daily. 04/21/20  Yes Ladell Pier, MD  carvedilol (COREG) 25 MG tablet TAKE 1 TABLET BY MOUTH TWICE A DAY WITH A MEAL Patient taking differently: Take 25 mg by mouth 2 (two) times daily with a meal. 06/20/20  Yes Martinique, Peter M, MD  Cholecalciferol (VITAMIN D) 125 MCG (5000 UT) CAPS Take 5,000 Units by mouth daily.   Yes [provider]  ENTRESTO 97-103 MG TAKE 1 TABLET BY MOUTH TWICE A DAY Patient taking differently: Take 1 tablet by mouth 2 (two) times daily. 05/05/20  Yes Martinique, Peter M, MD  furosemide (LASIX) 20 MG tablet TAKE 1 TABLET BY MOUTH EVERY DAY Patient taking differently: Take 20 mg by mouth daily. 03/23/20  Yes Ladell Pier, MD  gabapentin (NEURONTIN) 300 MG capsule TAKE 1 CAPSULE BY MOUTH EVERYDAY AT BEDTIME Patient taking differently: Take 300 mg by mouth at bedtime. 05/07/20  Yes Ladell Pier, MD  latanoprost (XALATAN) 0.005 % ophthalmic solution Place 1 drop into both eyes at bedtime. 05/31/16  Yes [provider]  methocarbamol (ROBAXIN) 500 MG tablet TAKE 1 TABLET (500 MG TOTAL) BY MOUTH 2 (TWO) TIMES DAILY AS NEEDED FOR MUSCLE SPASMS. 09/07/20  Yes Ladell Pier, MD  Multiple Vitamin (MULTIVITAMIN WITH MINERALS) TABS tablet Take 1 tablet by mouth daily. Centrum Silver   Yes [provider]  spironolactone (ALDACTONE) 25 MG tablet Take 1 tablet (25 mg total) by mouth daily. 04/22/20 07/21/20 Yes Ladell Pier, MD  traMADol (ULTRAM) 50 MG tablet Take 1 tablet (50 mg total) by mouth every 8 (eight) hours as needed. Patient taking differently: Take 50 mg by mouth every 8 (eight) hours as needed for moderate pain. 07/21/20  Yes Ladell Pier, MD  carvedilol (COREG) 25 MG tablet Take 1 tablet (25 mg total) by mouth 2 (two) times daily with a meal. 06/19/20   Tommie Raymond, NP   empagliflozin (JARDIANCE) 10 MG TABS tablet Take 1 tablet (10 mg total) by mouth daily before breakfast. Patient not taking: Reported on 09/11/2020 06/24/20   Martinique, Peter M, MD  nicotine (NICODERM CQ - DOSED IN MG/24 HOURS) 21 mg/24hr patch Place 1 patch (21 mg total) onto the skin daily. Patient not taking: Reported on 09/11/2020 07/21/20   Ladell Pier, MD    Allergies    Patient has no known allergies.  Review of Systems   Review of  Systems  Constitutional: Negative for diaphoresis, fatigue and fever.  HENT: Negative for trouble swallowing.   Eyes: Negative for redness.  Respiratory: Negative for cough and shortness of breath.   Cardiovascular: Positive for chest pain. Negative for palpitations, orthopnea, claudication, syncope, PND and near-syncope.  Gastrointestinal: Negative for abdominal pain, anorexia, heartburn, nausea and vomiting.  Genitourinary: Negative for flank pain.  Musculoskeletal: Negative for back pain.  Skin: Negative for wound.  Neurological: Negative for dizziness, weakness, numbness and headaches.  Psychiatric/Behavioral: Negative for agitation.  All other systems reviewed and are negative.   Physical Exam Updated Vital Signs BP 132/77 (BP Location: Right Arm)   Pulse (!) 57   Temp 98 F (36.7 C) (Oral)   Resp 20   Ht 6' 1"  (1.854 m)   Wt 116.6 kg   SpO2 99%   BMI 33.91 kg/m   Physical Exam Vitals and nursing note reviewed.  Constitutional:      Appearance: Normal appearance. He is not diaphoretic.  HENT:     Head: Normocephalic and atraumatic.     Nose: Nose normal.  Eyes:     Conjunctiva/sclera: Conjunctivae normal.     Pupils: Pupils are equal, round, and reactive to light.  Cardiovascular:     Rate and Rhythm: Normal rate and regular rhythm.     Pulses: Normal pulses.     Heart sounds: Normal heart sounds.  Pulmonary:     Effort: Pulmonary effort is normal.     Breath sounds: Normal breath sounds.  Abdominal:     General:  Abdomen is flat. Bowel sounds are normal.     Palpations: Abdomen is soft.     Tenderness: There is no abdominal tenderness. There is no guarding.  Musculoskeletal:        General: Normal range of motion.     Cervical back: Normal range of motion and neck supple.  Skin:    General: Skin is warm and dry.     Capillary Refill: Capillary refill takes less than 2 seconds.  Neurological:     General: No focal deficit present.     Mental Status: He is alert and oriented to person, place, and time.     Deep Tendon Reflexes: Reflexes normal.  Psychiatric:        Mood and Affect: Mood normal.        Behavior: Behavior normal.     ED Results / Procedures / Treatments   Labs (all labs ordered are listed, but only abnormal results are displayed) Results for orders placed or performed during the hospital encounter of 40/98/11  Basic metabolic panel  Result Value Ref Range   Sodium 141 135 - 145 mmol/L   Potassium 4.3 3.5 - 5.1 mmol/L   Chloride 105 98 - 111 mmol/L   CO2 26 22 - 32 mmol/L   Glucose, Bld 99 70 - 99 mg/dL   BUN 14 6 - 20 mg/dL   Creatinine, Ser 1.18 0.61 - 1.24 mg/dL   Calcium 9.6 8.9 - 10.3 mg/dL   GFR, Estimated >60 >60 mL/min   Anion gap 10 5 - 15  CBC  Result Value Ref Range   WBC 7.0 4.0 - 10.5 K/uL   RBC 5.10 4.22 - 5.81 MIL/uL   Hemoglobin 15.3 13.0 - 17.0 g/dL   HCT 45.2 39.0 - 52.0 %   MCV 88.6 80.0 - 100.0 fL   MCH 30.0 26.0 - 34.0 pg   MCHC 33.8 30.0 - 36.0 g/dL   RDW 13.4  11.5 - 15.5 %   Platelets 161 150 - 400 K/uL   nRBC 0.0 0.0 - 0.2 %  Troponin I (High Sensitivity)  Result Value Ref Range   Troponin I (High Sensitivity) 9 <18 ng/L  Troponin I (High Sensitivity)  Result Value Ref Range   Troponin I (High Sensitivity) 38 (H) <18 ng/L   DG Chest 2 View  Result Date: 09/10/2020 CLINICAL DATA:  Chest pain, history of coronary artery disease, left-sided chest pain and left arm numbness for 2 hours EXAM: CHEST - 2 VIEW COMPARISON:  06/07/2020  FINDINGS: Frontal and lateral views of the chest demonstrates single lead pacer/AICD unchanged. Cardiac silhouette is stable, with postsurgical changes from prior median sternotomy. No airspace disease, effusion, or pneumothorax. No acute bony abnormalities. IMPRESSION: 1. Stable exam, no acute process. Electronically Signed   By: Randa Ngo M.D.   On: 09/10/2020 22:34   CUP PACEART REMOTE DEVICE CHECK  Result Date: 08/25/2020 Scheduled remote reviewed. Normal device function.  RP Next remote 91 days.   EKG EKG Interpretation  Date/Time:  Saturday Sep 10 2020 22:17:43 EDT Ventricular Rate:  71 PR Interval:  234 QRS Duration: 86 QT Interval:  420 QTC Calculation: 456 R Axis:   -33 Text Interpretation: Sinus rhythm with 1st degree A-V block Left axis deviation Possible Lateral infarct , age undetermined Inferior st t changes Confirmed by Dory Horn) on 09/11/2020 3:13:43 AM   Radiology DG Chest 2 View  Result Date: 09/10/2020 CLINICAL DATA:  Chest pain, history of coronary artery disease, left-sided chest pain and left arm numbness for 2 hours EXAM: CHEST - 2 VIEW COMPARISON:  06/07/2020 FINDINGS: Frontal and lateral views of the chest demonstrates single lead pacer/AICD unchanged. Cardiac silhouette is stable, with postsurgical changes from prior median sternotomy. No airspace disease, effusion, or pneumothorax. No acute bony abnormalities. IMPRESSION: 1. Stable exam, no acute process. Electronically Signed   By: Randa Ngo M.D.   On: 09/10/2020 22:34    Procedures Procedures   Medications Ordered in ED Medications  aspirin chewable tablet 324 mg (has no administration in time range)  alum & mag hydroxide-simeth (MAALOX/MYLANTA) 200-200-20 MG/5ML suspension 30 mL (has no administration in time range)  nitroGLYCERIN (NITROGLYN) 2 % ointment 0.5 inch (has no administration in time range)    ED Course  I have reviewed the triage vital signs and the nursing  notes.  Pertinent labs & imaging results that were available during my care of the patient were reviewed by me and considered in my medical decision making (see chart for details).    Appreciate nurses note but patient denied radiation of CP.  No SOB, no exertional symptoms.    322 Case d/w Dr. Jaynie Collins who will see the patient for admission   Final Clinical Impression(s) / ED Diagnoses Final diagnoses:  Unstable angina ([place])  Abnormal EKG   Admit to medicine    Robert Mauney, MD 09/11/20 2841

## 2020-09-11 NOTE — ED Notes (Signed)
Attempted to call report x1. Nurse unavailable

## 2020-09-11 NOTE — Progress Notes (Signed)
*  PRELIMINARY RESULTS* Echocardiogram 2D Echocardiogram has been performed with Definity.  Samuel Germany 09/11/2020, 4:04 PM

## 2020-09-11 NOTE — Progress Notes (Signed)
ANTICOAGULATION CONSULT NOTE - Follow Up Consult  Pharmacy Consult for Heparin Indication: chest pain/ACS  No Known Allergies  Patient Measurements: Height: 6' (182.9 cm) Weight: 118.6 kg (261 lb 8 oz) IBW/kg (Calculated) : 77.6 kg Heparin Dosing Weight: 104.9 kg  Vital Signs: Temp: 98 F (36.7 C) (05/29 0750) Temp Source: Oral (05/29 0750) BP: 85/74 (05/29 0750) Pulse Rate: 60 (05/29 0750)  Labs: Recent Labs    09/10/20 2216 09/11/20 0016  HGB 15.3  --   HCT 45.2  --   PLT 161  --   CREATININE 1.18  --   TROPONINIHS 9 38*    Estimated Creatinine Clearance: 89.6 mL/min (by C-G formula based on SCr of 1.18 mg/dL).   Assessment: 60 yo male presents with a history of CAD s/p CABG 2014, ischemic CM, HFrEF s/p ICD, HTN, and HLD presents with chest pain and found to have an NSTEMI. PTA the patient is not on anticoagulation. The patient is planned for cardiac cath on 5/31. Pharmacy is consulted to dose heparin.  Heparin level is slightly supratherapeutic at 0.71 while running at 1400 units/hr. Per the RN, there have been no issues with the infusion and the patient is without signs or symptoms of bleeding. CBC is WNL and stable.  Goal of Therapy:  Heparin level 0.3-0.7 units/ml Monitor platelets by anticoagulation protocol: Yes   Plan:  -Decrease heparin IV to 1300 units/hr -Re-check heparin level in 6 hours -Obtain a daily heparin level and CBC -Monitor for signs and symptoms of bleeding   Shauna Hugh, PharmD, [place]  PGY-1 Pharmacy Resident 09/11/2020 8:42 AM  Please check [email or web address] for unit-specific pharmacy phone numbers.

## 2020-09-11 NOTE — Progress Notes (Signed)
ANTICOAGULATION CONSULT NOTE - Initial Consult  Pharmacy Consult for heparin Indication: chest pain/ACS  No Known Allergies  Patient Measurements: Height: 6' 1"  (185.4 cm) Weight: 116.6 kg (257 lb) IBW/kg (Calculated) : 79.9 Heparin Dosing Weight: 104.9  Vital Signs: Temp: 98 F (36.7 C) (05/29 0238) Temp Source: Oral (05/29 0238) BP: 109/67 (05/29 0430) Pulse Rate: 50 (05/29 0430)  Labs: Recent Labs    09/10/20 2216 09/11/20 0016  HGB 15.3  --   HCT 45.2  --   PLT 161  --   CREATININE 1.18  --   TROPONINIHS 9 38*    Estimated Creatinine Clearance: 90.2 mL/min (by C-G formula based on SCr of 1.18 mg/dL).   Medical History: Past Medical History:  Diagnosis Date  . Abnormal EKG    hx of ischemia showing up on ekg's  . Acute myopericarditis    a. 03/2013: readm with hypoxia, tachycardia, elevated ESR/CRP, elevated troponin with Dressler syndrome and myopericarditis; treated with steroids.  . Acute respiratory failure ([place])    a. 01/2013: VDRF. b. 03/2013: hypoxia requiring supp O2 during adm, resolved by discharge.  . C. difficile colitis    a. 01/2013 during prolonged adm. b. Recurred 03/2013.  . Cardiac tamponade    a. 01/2013 s/p drain.  . Cataracts, bilateral   . Coronary artery disease    a. s/p MI in 2009 in [place] with stenting of the LCX and LAD;  b. 12/2012 NSTEMI/CAD: LM nl, LAD patent prox stent, LCX 50-70 isr (FFR 0.84), RCA dom, 136m EF 40-45%-->Med Rx. c. 01/2013: anterolateral STEMI complicated by pericardial effusion (presumed purulent pericarditis) and tamponade s/p drain, ruptured LV pseudoaneurysm s/p CorMatrix patch, CABGx1 (SVG-OM1), fever, CVA, VDRF, C diff.  . CVA (cerebral infarction)    a. 01/2013 in setting of prolonged hospitalization - residual L arm weakness.  . Dressler syndrome ([place]    a. 03/2013: readm with hypoxia, tachycardia, elevated ESR/CRP, elevated troponin with Dressler syndrome and myopericarditis; treated with steroids.  .  Glaucoma   . Hyperlipidemia   . Hypokalemia   . Hyponatremia    a. During late 2014.  . Ischemic cardiomyopathy    a. Sept/Oct 2014: EF ~40% (ICM). b. 03/2013: EF 25-30%. Off ACEI due to hypotension (MIXED NICM/ICM).  .Marland KitchenOptic neuropathy, ischemic   . Pericardial effusion    a. 01/2013: pericardial effusion (presumed purulent pericarditis) and cardiac tamponade s/p drain. b. Persistent moderate pericardial effusion 03/2013.  . Pre-diabetes   . Pseudoaneurysm of left ventricle of heart    a. 01/2013:  Ruptured inferoposterior LV pseudoaneurysm s/p CorMatrix patch.  . Sinus bradycardia    asymptomatic  . Stroke ([facility] 2009   weak lt side-lt arm  . Tobacco abuse   . Wears dentures    top    Medications:  See medication history  Assessment: 60yo man to start heparin for CP.  He was not on anticoagulation PTA.  Hg 15.3, PTLC 161 Goal of Therapy:  Heparin level 0.3-0.7 units/ml Monitor platelets by anticoagulation protocol: Yes   Plan:  Heparin 4000 unit bolus and drip at 1400 units/hr Check heparin level 6-8 hours after start Daily HL and CBC Monitor for bleeding complications  [name] 09/11/2020,4:47 AM

## 2020-09-12 DIAGNOSIS — I214 Non-ST elevation (NSTEMI) myocardial infarction: Secondary | ICD-10-CM | POA: Diagnosis not present

## 2020-09-12 LAB — CBC
HCT: 40.6 % (ref 39.0–52.0)
Hemoglobin: 13.7 g/dL (ref 13.0–17.0)
MCH: 29.8 pg (ref 26.0–34.0)
MCHC: 33.7 g/dL (ref 30.0–36.0)
MCV: 88.5 fL (ref 80.0–100.0)
Platelets: 141 10*3/uL — ABNORMAL LOW (ref 150–400)
RBC: 4.59 MIL/uL (ref 4.22–5.81)
RDW: 13.3 % (ref 11.5–15.5)
WBC: 6.2 10*3/uL (ref 4.0–10.5)
nRBC: 0 % (ref 0.0–0.2)

## 2020-09-12 LAB — HEPARIN LEVEL (UNFRACTIONATED): Heparin Unfractionated: 0.63 IU/mL (ref 0.30–0.70)

## 2020-09-12 LAB — HIV ANTIBODY (ROUTINE TESTING W REFLEX): HIV Screen 4th Generation wRfx: NONREACTIVE

## 2020-09-12 NOTE — Progress Notes (Signed)
ANTICOAGULATION CONSULT NOTE - Follow Up Consult  Pharmacy Consult for Heparin Indication: chest pain/ACS  No Known Allergies  Patient Measurements: Height: 6' (182.9 cm) Weight: 118.6 kg (261 lb 8 oz) IBW/kg (Calculated) : 77.6 kg Heparin Dosing Weight: 104.9 kg  Vital Signs: Temp: 98.6 F (37 C) (05/30 0322) Temp Source: Oral (05/30 0322) BP: 112/75 (05/30 0322) Pulse Rate: 53 (05/30 0322)  Labs: Recent Labs    09/10/20 2216 09/11/20 0016 09/11/20 1129 09/11/20 1920 09/12/20 0150  HGB 15.3  --   --   --  13.7  HCT 45.2  --   --   --  40.6  PLT 161  --   --   --  141*  HEPARINUNFRC  --   --  0.71* 0.56 0.63  CREATININE 1.18  --   --   --   --   TROPONINIHS 9 38*  --   --   --     Estimated Creatinine Clearance: 89.6 mL/min (by C-G formula based on SCr of 1.18 mg/dL).   Assessment: 60 yo male presents with a history of CAD s/p CABG 2014, ischemic CM, HFrEF s/p ICD, HTN, and HLD presents with chest pain and found to have an NSTEMI. PTA the patient is not on anticoagulation. The patient is planned for cardiac cath on 5/31. Pharmacy is consulted to dose heparin.  Today's heparin level therapeutic on 1300 units/hr. CBC stable. No s/sx of bleeding or infusion issues reported.  Goal of Therapy:  Heparin level 0.3-0.7 units/ml Monitor platelets by anticoagulation protocol: Yes   Plan:  -Continue heparin at to 1300 units/hr -Obtain a daily heparin level and CBC -Monitor for signs and symptoms of bleeding  Fara Olden, PharmD PGY-1 Pharmacy Resident 09/12/2020 7:09 AM Please see AMION for all pharmacy numbers

## 2020-09-12 NOTE — Progress Notes (Signed)
Progress Note  Patient Name: [name] Date of Encounter: 09/12/2020  [facility] Cardiologist: Peter Martinique, MD   Subjective   Yesterday had some hypotension in the morning hours.  His Entresto carvedilol were held at the time. Chest pain had resolved.  Feels well sitting up in chair.  No shortness of breath.  Awaiting cardiac catheterization.  Inpatient Medications    Scheduled Meds: . aspirin  81 mg Oral Daily  . atorvastatin  80 mg Oral Daily  . brimonidine  1 drop Both Eyes BID  . carvedilol  25 mg Oral BID WC  . furosemide  20 mg Oral Daily  . gabapentin  300 mg Oral QHS  . latanoprost  1 drop Both Eyes QHS  . sacubitril-valsartan  1 tablet Oral BID  . spironolactone  25 mg Oral Daily   Continuous Infusions: . heparin 1,300 Units/hr (09/11/20 1309)   PRN Meds: acetaminophen, nitroGLYCERIN, ondansetron (ZOFRAN) IV   Vital Signs    Vitals:   09/11/20 1948 09/11/20 2303 09/12/20 0322 09/12/20 0754  BP: 118/74 109/63 112/75 112/87  Pulse: 63 (!) 57 (!) 53   Resp: 18 16 16 16   Temp: 98.4 F (36.9 C) 98.6 F (37 C) 98.6 F (37 C) 98.5 F (36.9 C)  TempSrc: Oral Oral Oral Oral  SpO2: 98% 97% 97%   Weight:      Height:        Intake/Output Summary (Last 24 hours) at 09/12/2020 0929 Last data filed at 09/11/2020 1500 Gross per 24 hour  Intake 762.7 ml  Output --  Net 762.7 ml   Last 3 Weights 09/11/2020 09/10/2020 08/26/2020  Weight (lbs) 261 lb 8 oz 257 lb 265 lb 9.6 oz  Weight (kg) 118.616 kg 116.574 kg 120.475 kg      Telemetry    No adverse arrhythmias- Personally Reviewed  ECG    No new- Personally Reviewed  Physical Exam   GEN: No acute distress.   Neck: No JVD Cardiac: RRR, no murmurs, rubs, or gallops.  Respiratory: Clear to auscultation bilaterally. GI: Soft, nontender, non-distended  MS: No edema; No deformity. Neuro:  Nonfocal  Psych: Normal affect   Labs    High Sensitivity Troponin:   Recent Labs  Lab  09/10/20 2216 09/11/20 0016  TROPONINIHS 9 38*      Chemistry Recent Labs  Lab 09/10/20 2216  NA 141  K 4.3  CL 105  CO2 26  GLUCOSE 99  BUN 14  CREATININE 1.18  CALCIUM 9.6  GFRNONAA >60  ANIONGAP 10     Hematology Recent Labs  Lab 09/10/20 2216 09/12/20 0150  WBC 7.0 6.2  RBC 5.10 4.59  HGB 15.3 13.7  HCT 45.2 40.6  MCV 88.6 88.5  MCH 30.0 29.8  MCHC 33.8 33.7  RDW 13.4 13.3  PLT 161 141*    BNPNo results for input(s): BNP, PROBNP in the last 168 hours.   DDimer No results for input(s): DDIMER in the last 168 hours.   Radiology    DG Chest 2 View  Result Date: 09/10/2020 CLINICAL DATA:  Chest pain, history of coronary artery disease, left-sided chest pain and left arm numbness for 2 hours EXAM: CHEST - 2 VIEW COMPARISON:  06/07/2020 FINDINGS: Frontal and lateral views of the chest demonstrates single lead pacer/AICD unchanged. Cardiac silhouette is stable, with postsurgical changes from prior median sternotomy. No airspace disease, effusion, or pneumothorax. No acute bony abnormalities. IMPRESSION: 1. Stable exam, no acute process. Electronically Signed  By: Randa Ngo M.D.   On: 09/10/2020 22:34   ECHOCARDIOGRAM COMPLETE  Result Date: 09/11/2020    ECHOCARDIOGRAM REPORT   Patient Name:   [name] Date of Exam: 09/11/2020 Medical Rec #:  [id number]         Height:       72.0 in Accession #:    [id number]        Weight:       261.5 lb Date of Birth:  10/06/1960          BSA:          2.388 m Patient Age:    60 years          BP:           96/64 mmHg Patient Gender: M                 HR:           61 bpm. Exam Location:  Inpatient Procedure: 2D Echo, Cardiac Doppler and Color Doppler Indications:    Acute myocardial infarction, unspecified I21.9  History:        Patient has prior history of Echocardiogram examinations. Prior                 CABG, Stroke; Risk Factors:Hypertension, Diabetes, Dyslipidemia                 and Former Smoker. 2014- Left  Ventricular Pseudoanuerysm,                 Myopericarditis, Acute Respiratory Failure, Dresslers Syndrome                 with Pericardial Tamponade (status post Tap).  Sonographer:    Alvino Chapel RCS Referring Phys: [id number] [place]  1. Left ventricular ejection fraction, by estimation, is 25 to 30%. The left ventricle has severely decreased function. The left ventricle demonstrates regional wall motion abnormalities (see scoring diagram/findings for description). Left ventricular diastolic parameters are consistent with Grade II diastolic dysfunction (pseudonormalization). Elevated left ventricular end-diastolic pressure. There is of the left ventricular, . There is of the left ventricular,.  2. Right ventricular systolic function was not well visualized. The right ventricular size is normal. Tricuspid regurgitation signal is inadequate for assessing PA pressure.  3. The mitral valve is grossly normal. Trivial mitral valve regurgitation.  4. The aortic valve is tricuspid. Aortic valve regurgitation is not visualized. No aortic stenosis is present.  5. The inferior vena cava is normal in size with <50% respiratory variability, suggesting right atrial pressure of 8 mmHg. Comparison(s): No significant change from prior study. FINDINGS  Left Ventricle: LV thrombus excluded by contrast. Left ventricular ejection fraction, by estimation, is 25 to 30%. The left ventricle has severely decreased function. The left ventricle demonstrates regional wall motion abnormalities. Definity contrast agent was given IV to delineate the left ventricular endocardial borders. The left ventricular internal cavity size was normal in size. There is borderline left ventricular hypertrophy. Left ventricular diastolic parameters are consistent with Grade II diastolic dysfunction (pseudonormalization). Elevated left ventricular end-diastolic pressure.  LV Wall Scoring: The entire inferior wall, mid inferoseptal  segment, and basal inferoseptal segment are akinetic. The entire anterior wall, entire lateral wall, entire anterior septum, and apex are hypokinetic. Right Ventricle: The right ventricular size is normal. Right vetricular wall thickness was not well visualized. Right ventricular systolic function was not well visualized. Tricuspid regurgitation signal is inadequate for assessing PA pressure.  Left Atrium: Left atrial size was not well visualized. Right Atrium: Right atrial size was not well visualized. Pericardium: Trivial pericardial effusion is present. Mitral Valve: The mitral valve is grossly normal. Trivial mitral valve regurgitation. Tricuspid Valve: The tricuspid valve is grossly normal. Tricuspid valve regurgitation is trivial. Aortic Valve: The aortic valve is tricuspid. Aortic valve regurgitation is not visualized. No aortic stenosis is present. Pulmonic Valve: The pulmonic valve was not well visualized. Pulmonic valve regurgitation is not visualized. Aorta: The aortic root, ascending aorta and aortic arch are all structurally normal, with no evidence of dilitation or obstruction. Venous: The inferior vena cava is normal in size with less than 50% respiratory variability, suggesting right atrial pressure of 8 mmHg. IAS/Shunts: The interatrial septum was not well visualized.  LEFT VENTRICLE PLAX 2D LVIDd:         5.22 cm  Diastology LVIDs:         4.80 cm  LV e' medial:    3.71 cm/s LV PW:         1.30 cm  LV E/e' medial:  20.9 LV IVS:        1.10 cm  LV e' lateral:   7.01 cm/s LVOT diam:     2.20 cm  LV E/e' lateral: 11.1 LV SV:         66 LV SV Index:   28 LVOT Area:     3.80 cm  RIGHT VENTRICLE RV S prime:     5.80 cm/s TAPSE (M-mode): 1.2 cm LEFT ATRIUM             Index       RIGHT ATRIUM           Index LA diam:        4.10 cm 1.72 cm/m  RA Area:     19.90 cm LA Vol (A2C):   93.1 ml 38.99 ml/m RA Volume:   54.00 ml  22.62 ml/m LA Vol (A4C):   68.3 ml 28.60 ml/m LA Biplane Vol: 80.7 ml 33.80  ml/m  AORTIC VALVE LVOT Vmax:   74.10 cm/s LVOT Vmean:  42.900 cm/s LVOT VTI:    0.173 m  AORTA Ao Root diam: 3.80 cm MITRAL VALVE MV Area (PHT): 3.56 cm    SHUNTS MV Decel Time: 213 msec    Systemic VTI:  0.17 m MV E velocity: 77.70 cm/s  Systemic Diam: 2.20 cm MV A velocity: 68.60 cm/s MV E/A ratio:  1.13 Buford Dresser MD Electronically signed by Buford Dresser MD Signature Date/Time: 09/11/2020/6:09:44 PM    Final     Cardiac Studies   ECHO 09/11/20: 1. Left ventricular ejection fraction, by estimation, is 25 to 30%. The  left ventricle has severely decreased function. The left ventricle  demonstrates regional wall motion abnormalities (see scoring  diagram/findings for description). Left ventricular  diastolic parameters are consistent with Grade II diastolic dysfunction  (pseudonormalization). Elevated left ventricular end-diastolic pressure.  There is of the left ventricular, . There is of the left ventricular,.  2. Right ventricular systolic function was not well visualized. The right  ventricular size is normal. Tricuspid regurgitation signal is inadequate  for assessing PA pressure.  3. The mitral valve is grossly normal. Trivial mitral valve  regurgitation.  4. The aortic valve is tricuspid. Aortic valve regurgitation is not  visualized. No aortic stenosis is present.  5. The inferior vena cava is normal in size with <50% respiratory  variability, suggesting right atrial pressure of 8 mmHg.  Comparison(s): No significant change from prior study.  Patient Profile     60 y.o. male with ischemic cardiomyopathy, chronic systolic heart failure, EF 25% stable on echocardiogram during this admission, ICD, with coronary disease post CABG 2014 here with chest pain and mildly elevated troponin consistent with non-ST elevation myocardial infarction  Assessment & Plan    Non-ST elevation myocardial infarction CAD status post CABG 2014 - Awaiting cardiac  catheterization tomorrow. - Continuing with heparin drip - Echocardiogram performed yesterday shows stability with reduced ejection fraction of 87%  Chronic systolic heart failure secondary to ischemic cardiomyopathy - ICD in place - Entresto was administered this morning - Carvedilol was held -Continue to monitor.  Appears euvolemic currently.  Discussed with nursing team.   For questions or updates, please contact [place] Please consult [email or web address] for contact info under        Signed, Candee Furbish, MD  09/12/2020, 9:29 AM

## 2020-09-13 ENCOUNTER — Other Ambulatory Visit (HOSPITAL_COMMUNITY): Payer: Self-pay

## 2020-09-13 ENCOUNTER — Inpatient Hospital Stay (HOSPITAL_COMMUNITY)
Admission: EM | Disposition: A | Discharge status: Home / Self Care | Payer: Self-pay | Source: Home / Self Care | Attending: Internal Medicine

## 2020-09-13 DIAGNOSIS — I200 Unstable angina: Secondary | ICD-10-CM

## 2020-09-13 DIAGNOSIS — I25110 Atherosclerotic heart disease of native coronary artery with unstable angina pectoris: Principal | ICD-10-CM

## 2020-09-13 DIAGNOSIS — I25710 Atherosclerosis of autologous vein coronary artery bypass graft(s) with unstable angina pectoris: Secondary | ICD-10-CM | POA: Diagnosis not present

## 2020-09-13 HISTORY — PX: LEFT HEART CATH AND CORS/GRAFTS ANGIOGRAPHY: CATH118250

## 2020-09-13 LAB — CBC
HCT: 41.1 % (ref 39.0–52.0)
Hemoglobin: 14.2 g/dL (ref 13.0–17.0)
MCH: 29.9 pg (ref 26.0–34.0)
MCHC: 34.5 g/dL (ref 30.0–36.0)
MCV: 86.5 fL (ref 80.0–100.0)
Platelets: 129 10*3/uL — ABNORMAL LOW (ref 150–400)
RBC: 4.75 MIL/uL (ref 4.22–5.81)
RDW: 13.1 % (ref 11.5–15.5)
WBC: 4.9 10*3/uL (ref 4.0–10.5)
nRBC: 0 % (ref 0.0–0.2)

## 2020-09-13 LAB — HEPARIN LEVEL (UNFRACTIONATED): Heparin Unfractionated: 0.44 IU/mL (ref 0.30–0.70)

## 2020-09-13 SURGERY — LEFT HEART CATH AND CORS/GRAFTS ANGIOGRAPHY
Anesthesia: LOCAL

## 2020-09-13 MED ORDER — LIDOCAINE HCL (PF) 1 % IJ SOLN
INTRAMUSCULAR | Status: DC | PRN
  Administered 2020-09-13: 2 mL via SUBCUTANEOUS

## 2020-09-13 MED ORDER — SODIUM CHLORIDE 0.9% FLUSH: 3.0000 mL | Freq: Two times a day (BID) | INTRAVENOUS | Status: DC

## 2020-09-13 MED ORDER — HEPARIN SODIUM (PORCINE) 1000 UNIT/ML IJ SOLN
INTRAMUSCULAR | Status: DC | PRN
  Administered 2020-09-13: 6000 [IU] via INTRAVENOUS

## 2020-09-13 MED ORDER — SODIUM CHLORIDE 0.9% FLUSH
3.0000 mL | Freq: Two times a day (BID) | INTRAVENOUS | Status: DC
  Administered 2020-09-14: 3 mL via INTRAVENOUS

## 2020-09-13 MED ORDER — SODIUM CHLORIDE 0.9% FLUSH: 3.0000 mL | INTRAVENOUS | Status: DC | PRN

## 2020-09-13 MED ORDER — SODIUM CHLORIDE 0.9 % IV SOLN: INTRAVENOUS | Status: DC

## 2020-09-13 MED ORDER — IOHEXOL 350 MG/ML SOLN
INTRAVENOUS | Status: DC | PRN
  Administered 2020-09-13: 65 mL via INTRA_ARTERIAL

## 2020-09-13 MED ORDER — HEPARIN SODIUM (PORCINE) 1000 UNIT/ML IJ SOLN
INTRAMUSCULAR | Status: AC
  Filled 2020-09-13: qty 1

## 2020-09-13 MED ORDER — SODIUM CHLORIDE 0.9 % WEIGHT BASED INFUSION: 1.0000 mL/kg/h | INTRAVENOUS | Status: AC

## 2020-09-13 MED ORDER — SODIUM CHLORIDE 0.9 % IV SOLN: 250.0000 mL | INTRAVENOUS | Status: DC | PRN

## 2020-09-13 MED ORDER — FENTANYL CITRATE (PF) 100 MCG/2ML IJ SOLN
INTRAMUSCULAR | Status: AC
  Filled 2020-09-13: qty 2

## 2020-09-13 MED ORDER — MIDAZOLAM HCL 2 MG/2ML IJ SOLN
INTRAMUSCULAR | Status: AC
  Filled 2020-09-13: qty 2

## 2020-09-13 MED ORDER — MIDAZOLAM HCL 2 MG/2ML IJ SOLN
INTRAMUSCULAR | Status: DC | PRN
  Administered 2020-09-13: 2 mg via INTRAVENOUS

## 2020-09-13 MED ORDER — LIDOCAINE HCL (PF) 1 % IJ SOLN
INTRAMUSCULAR | Status: AC
  Filled 2020-09-13: qty 30

## 2020-09-13 MED ORDER — VERAPAMIL HCL 2.5 MG/ML IV SOLN
INTRAVENOUS | Status: AC
  Filled 2020-09-13: qty 2

## 2020-09-13 MED ORDER — ASPIRIN 81 MG PO CHEW: 81.0000 mg | CHEWABLE_TABLET | ORAL | Status: DC

## 2020-09-13 MED ORDER — HEPARIN (PORCINE) IN NACL 1000-0.9 UT/500ML-% IV SOLN
INTRAVENOUS | Status: AC
  Filled 2020-09-13: qty 1000

## 2020-09-13 MED ORDER — HEPARIN (PORCINE) IN NACL 1000-0.9 UT/500ML-% IV SOLN
INTRAVENOUS | Status: AC
  Filled 2020-09-13: qty 500

## 2020-09-13 MED ORDER — FENTANYL CITRATE (PF) 100 MCG/2ML IJ SOLN
INTRAMUSCULAR | Status: DC | PRN
  Administered 2020-09-13: 25 ug via INTRAVENOUS

## 2020-09-13 MED ORDER — VERAPAMIL HCL 2.5 MG/ML IV SOLN
INTRAVENOUS | Status: DC | PRN
  Administered 2020-09-13: 10 mL via INTRA_ARTERIAL

## 2020-09-13 SURGICAL SUPPLY — 10 items
CATH 5FR JL3.5 JR4 ANG PIG MP (CATHETERS) ×1 IMPLANT
CATH INFINITI 5FR AL1 (CATHETERS) ×1 IMPLANT
DEVICE RAD COMP TR BAND LRG (VASCULAR PRODUCTS) ×1 IMPLANT
GLIDESHEATH SLEND SS 6F .021 (SHEATH) ×1 IMPLANT
GUIDEWIRE INQWIRE 1.5J.035X260 (WIRE) IMPLANT
INQWIRE 1.5J .035X260CM (WIRE) ×2
KIT HEART LEFT (KITS) ×2 IMPLANT
PACK CARDIAC CATHETERIZATION (CUSTOM PROCEDURE TRAY) ×2 IMPLANT
TRANSDUCER W/STOPCOCK (MISCELLANEOUS) ×2 IMPLANT
TUBING CIL FLEX 10 FLL-RA (TUBING) ×2 IMPLANT

## 2020-09-13 NOTE — H&P (View-Only) (Signed)
Progress Note  Patient Name: [name] Date of Encounter: 09/13/2020  [facility] Cardiologist: Peter Martinique, MD   Subjective   No chest pain or dyspnea.   Inpatient Medications    Scheduled Meds: . aspirin  81 mg Oral Daily  . atorvastatin  80 mg Oral Daily  . brimonidine  1 drop Both Eyes BID  . carvedilol  25 mg Oral BID WC  . furosemide  20 mg Oral Daily  . gabapentin  300 mg Oral QHS  . latanoprost  1 drop Both Eyes QHS  . sacubitril-valsartan  1 tablet Oral BID  . spironolactone  25 mg Oral Daily   Continuous Infusions: . heparin 1,300 Units/hr (09/12/20 2143)   PRN Meds: acetaminophen, nitroGLYCERIN, ondansetron (ZOFRAN) IV   Vital Signs    Vitals:   09/12/20 1608 09/12/20 2037 09/13/20 0337 09/13/20 0926  BP: (!) 89/66 123/78 123/68   Pulse: 88 60 (!) 54   Resp:  17 16 16   Temp: 98.6 F (37 C) 99.1 F (37.3 C) 98.7 F (37.1 C) 98.7 F (37.1 C)  TempSrc: Oral Oral Oral Oral  SpO2: 99% 99% 99%   Weight:      Height:        Intake/Output Summary (Last 24 hours) at 09/13/2020 0929 Last data filed at 09/12/2020 1300 Gross per 24 hour  Intake 600 ml  Output --  Net 600 ml   Last 3 Weights 09/11/2020 09/10/2020 08/26/2020  Weight (lbs) 261 lb 8 oz 257 lb 265 lb 9.6 oz  Weight (kg) 118.616 kg 116.574 kg 120.475 kg      Telemetry    Sinus bradycardia- Personally Reviewed  ECG    No AM EKG- Personally Reviewed  Physical Exam   General: Well developed, well nourished, NAD  SKIN: warm, dry. No rashes. Neuro: No focal deficits  Psychiatric: Mood and affect normal  Neck: No JVD, no carotid bruits, no thyromegaly, no lymphadenopathy.  Lungs:Clear bilaterally, no wheezes, rhonci, crackles Cardiovascular: Regular,. Loletha Grayer. No murmurs, gallops or rubs. Abdomen:Soft. Bowel sounds present. Non-tender.  Extremities: No lower extremity edema.   Labs    High Sensitivity Troponin:   Recent Labs  Lab 09/10/20 2216 09/11/20 0016   TROPONINIHS 9 38*      Chemistry Recent Labs  Lab 09/10/20 2216  NA 141  K 4.3  CL 105  CO2 26  GLUCOSE 99  BUN 14  CREATININE 1.18  CALCIUM 9.6  GFRNONAA >60  ANIONGAP 10     Hematology Recent Labs  Lab 09/10/20 2216 09/12/20 0150 09/13/20 0322  WBC 7.0 6.2 4.9  RBC 5.10 4.59 4.75  HGB 15.3 13.7 14.2  HCT 45.2 40.6 41.1  MCV 88.6 88.5 86.5  MCH 30.0 29.8 29.9  MCHC 33.8 33.7 34.5  RDW 13.4 13.3 13.1  PLT 161 141* 129*    BNPNo results for input(s): BNP, PROBNP in the last 168 hours.   DDimer No results for input(s): DDIMER in the last 168 hours.   Radiology    ECHOCARDIOGRAM COMPLETE  Result Date: 09/11/2020    ECHOCARDIOGRAM REPORT   Patient Name:   [name] Date of Exam: 09/11/2020 Medical Rec #:  [id number]         Height:       72.0 in Accession #:    [id number]        Weight:       261.5 lb Date of Birth:  59-Jul-1962  BSA:          2.388 m Patient Age:    60 years          BP:           96/64 mmHg Patient Gender: M                 HR:           61 bpm. Exam Location:  Inpatient Procedure: 2D Echo, Cardiac Doppler and Color Doppler Indications:    Acute myocardial infarction, unspecified I21.9  History:        Patient has prior history of Echocardiogram examinations. Prior                 CABG, Stroke; Risk Factors:Hypertension, Diabetes, Dyslipidemia                 and Former Smoker. 2014- Left Ventricular Pseudoanuerysm,                 Myopericarditis, Acute Respiratory Failure, Dresslers Syndrome                 with Pericardial Tamponade (status post Tap).  Sonographer:    Alvino Chapel RCS Referring Phys: [id number] [place]  1. Left ventricular ejection fraction, by estimation, is 25 to 30%. The left ventricle has severely decreased function. The left ventricle demonstrates regional wall motion abnormalities (see scoring diagram/findings for description). Left ventricular diastolic parameters are consistent with Grade  II diastolic dysfunction (pseudonormalization). Elevated left ventricular end-diastolic pressure. There is of the left ventricular, . There is of the left ventricular,.  2. Right ventricular systolic function was not well visualized. The right ventricular size is normal. Tricuspid regurgitation signal is inadequate for assessing PA pressure.  3. The mitral valve is grossly normal. Trivial mitral valve regurgitation.  4. The aortic valve is tricuspid. Aortic valve regurgitation is not visualized. No aortic stenosis is present.  5. The inferior vena cava is normal in size with <50% respiratory variability, suggesting right atrial pressure of 8 mmHg. Comparison(s): No significant change from prior study. FINDINGS  Left Ventricle: LV thrombus excluded by contrast. Left ventricular ejection fraction, by estimation, is 25 to 30%. The left ventricle has severely decreased function. The left ventricle demonstrates regional wall motion abnormalities. Definity contrast agent was given IV to delineate the left ventricular endocardial borders. The left ventricular internal cavity size was normal in size. There is borderline left ventricular hypertrophy. Left ventricular diastolic parameters are consistent with Grade II diastolic dysfunction (pseudonormalization). Elevated left ventricular end-diastolic pressure.  LV Wall Scoring: The entire inferior wall, mid inferoseptal segment, and basal inferoseptal segment are akinetic. The entire anterior wall, entire lateral wall, entire anterior septum, and apex are hypokinetic. Right Ventricle: The right ventricular size is normal. Right vetricular wall thickness was not well visualized. Right ventricular systolic function was not well visualized. Tricuspid regurgitation signal is inadequate for assessing PA pressure. Left Atrium: Left atrial size was not well visualized. Right Atrium: Right atrial size was not well visualized. Pericardium: Trivial pericardial effusion is present.  Mitral Valve: The mitral valve is grossly normal. Trivial mitral valve regurgitation. Tricuspid Valve: The tricuspid valve is grossly normal. Tricuspid valve regurgitation is trivial. Aortic Valve: The aortic valve is tricuspid. Aortic valve regurgitation is not visualized. No aortic stenosis is present. Pulmonic Valve: The pulmonic valve was not well visualized. Pulmonic valve regurgitation is not visualized. Aorta: The aortic root, ascending aorta and aortic arch are all structurally  normal, with no evidence of dilitation or obstruction. Venous: The inferior vena cava is normal in size with less than 50% respiratory variability, suggesting right atrial pressure of 8 mmHg. IAS/Shunts: The interatrial septum was not well visualized.  LEFT VENTRICLE PLAX 2D LVIDd:         5.22 cm  Diastology LVIDs:         4.80 cm  LV e' medial:    3.71 cm/s LV PW:         1.30 cm  LV E/e' medial:  20.9 LV IVS:        1.10 cm  LV e' lateral:   7.01 cm/s LVOT diam:     2.20 cm  LV E/e' lateral: 11.1 LV SV:         66 LV SV Index:   28 LVOT Area:     3.80 cm  RIGHT VENTRICLE RV S prime:     5.80 cm/s TAPSE (M-mode): 1.2 cm LEFT ATRIUM             Index       RIGHT ATRIUM           Index LA diam:        4.10 cm 1.72 cm/m  RA Area:     19.90 cm LA Vol (A2C):   93.1 ml 38.99 ml/m RA Volume:   54.00 ml  22.62 ml/m LA Vol (A4C):   68.3 ml 28.60 ml/m LA Biplane Vol: 80.7 ml 33.80 ml/m  AORTIC VALVE LVOT Vmax:   74.10 cm/s LVOT Vmean:  42.900 cm/s LVOT VTI:    0.173 m  AORTA Ao Root diam: 3.80 cm MITRAL VALVE MV Area (PHT): 3.56 cm    SHUNTS MV Decel Time: 213 msec    Systemic VTI:  0.17 m MV E velocity: 77.70 cm/s  Systemic Diam: 2.20 cm MV A velocity: 68.60 cm/s MV E/A ratio:  1.13 Buford Dresser MD Electronically signed by Buford Dresser MD Signature Date/Time: 09/11/2020/6:09:44 PM    Final     Cardiac Studies   ECHO 09/11/20: 1. Left ventricular ejection fraction, by estimation, is 25 to 30%. The  left  ventricle has severely decreased function. The left ventricle  demonstrates regional wall motion abnormalities (see scoring  diagram/findings for description). Left ventricular  diastolic parameters are consistent with Grade II diastolic dysfunction  (pseudonormalization). Elevated left ventricular end-diastolic pressure.  There is of the left ventricular, . There is of the left ventricular,.  2. Right ventricular systolic function was not well visualized. The right  ventricular size is normal. Tricuspid regurgitation signal is inadequate  for assessing PA pressure.  3. The mitral valve is grossly normal. Trivial mitral valve  regurgitation.  4. The aortic valve is tricuspid. Aortic valve regurgitation is not  visualized. No aortic stenosis is present.  5. The inferior vena cava is normal in size with <50% respiratory  variability, suggesting right atrial pressure of 8 mmHg.   Comparison(s): No significant change from prior study.  Patient Profile     60 y.o. male with ischemic cardiomyopathy, chronic systolic heart failure, EF 25% stable on echocardiogram during this admission, ICD, with coronary disease post CABG 2014 admitted with chest pain and mildly elevated troponin.   Assessment & Plan     1. CAD s/p CABG: He is admitted with chest pain c/w unstable angina with mildly elevated troponin. He is s/p CABG in 2014. He is chest pain free today. Echocardiogram with reduced ejection fraction of 25% which  is unchanged.  -Plans for cardiac cath today per weekend rounding team.  -Continue IV heparin, ASA, statin -Coreg held today  2. Chronic systolic heart failure secondary to ischemic cardiomyopathy: ICD in place. Continue Entresto. Coreg has been held secondary to bradycardia. Euvolemic on exam today.    For questions or updates, please contact [place] Please consult [email or web address] for contact info under        Signed, Lauree Chandler, MD  09/13/2020, 9:29 AM

## 2020-09-13 NOTE — Progress Notes (Signed)
ANTICOAGULATION CONSULT NOTE - Follow Up Consult  Pharmacy Consult for Heparin Indication: chest pain/ACS  No Known Allergies  Patient Measurements: Height: 6' (182.9 cm) Weight: 118.6 kg (261 lb 8 oz) IBW/kg (Calculated) : 77.6 kg Heparin Dosing Weight: 104.9 kg  Vital Signs: Temp: 98.7 F (37.1 C) (05/31 0926) Temp Source: Oral (05/31 0926) BP: 123/68 (05/31 0337) Pulse Rate: 54 (05/31 0337)  Labs: Recent Labs    09/10/20 2216 09/11/20 0016 09/11/20 1129 09/11/20 1920 09/12/20 0150 09/13/20 0322  HGB 15.3  --   --   --  13.7 14.2  HCT 45.2  --   --   --  40.6 41.1  PLT 161  --   --   --  141* 129*  HEPARINUNFRC  --   --    < > 0.56 0.63 0.44  CREATININE 1.18  --   --   --   --   --   TROPONINIHS 9 38*  --   --   --   --    < > = values in this interval not displayed.    Estimated Creatinine Clearance: 89.6 mL/min (by C-G formula based on SCr of 1.18 mg/dL).   Assessment: 60 yo male presents with a history of CAD s/p CABG 2014, ischemic CM, HFrEF s/p ICD, HTN, and HLD presents with chest pain and found to have an NSTEMI. PTA the patient is not on anticoagulation. The patient is planned for cardiac cath on 5/31. Pharmacy is consulted to dose heparin.  Today's heparin level therapeutic 0.4 on 1300 units/hr. CBC stable. No s/sx of bleeding or infusion issues reported.  Goal of Therapy:  Heparin level 0.3-0.7 units/ml Monitor platelets by anticoagulation protocol: Yes   Plan:  -Continue heparin at to 1300 units/hr -Obtain a daily heparin level and CBC -Monitor for signs and symptoms of bleeding Follow up after cath     [place].D. CPP, BCPS Clinical Pharmacist [phone] 09/13/2020 10:00 AM   Please see AMION for all pharmacy numbers

## 2020-09-13 NOTE — Progress Notes (Signed)
Heart Failure Stewardship Pharmacist Progress Note   PCP: Ladell Pier, MD PCP-Cardiologist: Peter Martinique, MD    HPI:  60 yo male with PMH CAD s/p CABG in 2014, ICM, HFrEF s/p ICD, HTN, and HLD. Presents with CP/NSTEMI. ECHO on 09/11/20 with LVEF 25-30% which is up slightly from 20-25% in March 2021. Of note, patient stopped Jardiance in the past due to nausea/vomiting. Planning R/LHC today.  Current HF Medications: PO furosemide 20 mg daily Carvedilol 25 mg BID (holding) Entresto 97/103 mg BID Spironolactone 25 mg daily  Prior to admission HF Medications: Carvedilol 25 mg BID Entresto 97/103 mg BID Spironolactone 25 mg daily PO furosemide 20 mg daily  Pertinent Lab Values: . 09/10/20: Serum creatinine 1.18, BUN 14, Potassium 4.3, Sodium 141   Vital Signs: . Weight: 261.5 lbs on 09/11/20 (admission weight: 257 lbs) . Blood pressure: 100-120s/70s  . Heart rate: 50-60s   Medication Assistance / Insurance Benefits Check: Does the patient have prescription insurance?  Yes Type of insurance plan: [employer] Medicare and [place] Medicaid  Outpatient Pharmacy:  Prior to admission outpatient pharmacy: [facility] Is the patient willing to use [facility] at discharge? Yes Is the patient willing to transition their outpatient pharmacy to utilize a [facility] outpatient pharmacy?   Pending    Assessment: 1. Acute on chronic systolic CHF (EF 45-03%), due to ICM. NYHA class II symptoms. - Euvolemic on MD exam - continue PO furosemide 20 mg daily - Holding carvedilol for bradycardia - restart pending improvement of bradycardia and RHC results - Continue Entresto 97/103 mg BID - Continue spironolactone 25 mg daily - Spoke with patient and he is willing to try Farxiga 10 mg daily. Consider adding Wilder Glade post-cath prior to discharge.    Plan: 1) Medication changes recommended at this time: - Continue current medications - Follow-up results of [facility] for further GDMT  optimization  2) Patient assistance: - Farxiga copay $4/month  3)  Education  - To be completed prior to discharge  Richardine Service, PharmD, BCPS Kerby Nora, PharmD, BCPS Heart Failure Stewardship Pharmacist Phone [phone]

## 2020-09-13 NOTE — Progress Notes (Signed)
Progress Note  Patient Name: [name] Date of Encounter: 09/13/2020  [facility] Cardiologist: Peter Martinique, MD   Subjective   No chest pain or dyspnea.   Inpatient Medications    Scheduled Meds: . aspirin  81 mg Oral Daily  . atorvastatin  80 mg Oral Daily  . brimonidine  1 drop Both Eyes BID  . carvedilol  25 mg Oral BID WC  . furosemide  20 mg Oral Daily  . gabapentin  300 mg Oral QHS  . latanoprost  1 drop Both Eyes QHS  . sacubitril-valsartan  1 tablet Oral BID  . spironolactone  25 mg Oral Daily   Continuous Infusions: . heparin 1,300 Units/hr (09/12/20 2143)   PRN Meds: acetaminophen, nitroGLYCERIN, ondansetron (ZOFRAN) IV   Vital Signs    Vitals:   09/12/20 1608 09/12/20 2037 09/13/20 0337 09/13/20 0926  BP: (!) 89/66 123/78 123/68   Pulse: 88 60 (!) 54   Resp:  17 16 16   Temp: 98.6 F (37 C) 99.1 F (37.3 C) 98.7 F (37.1 C) 98.7 F (37.1 C)  TempSrc: Oral Oral Oral Oral  SpO2: 99% 99% 99%   Weight:      Height:        Intake/Output Summary (Last 24 hours) at 09/13/2020 0929 Last data filed at 09/12/2020 1300 Gross per 24 hour  Intake 600 ml  Output --  Net 600 ml   Last 3 Weights 09/11/2020 09/10/2020 08/26/2020  Weight (lbs) 261 lb 8 oz 257 lb 265 lb 9.6 oz  Weight (kg) 118.616 kg 116.574 kg 120.475 kg      Telemetry    Sinus bradycardia- Personally Reviewed  ECG    No AM EKG- Personally Reviewed  Physical Exam   General: Well developed, well nourished, NAD  SKIN: warm, dry. No rashes. Neuro: No focal deficits  Psychiatric: Mood and affect normal  Neck: No JVD, no carotid bruits, no thyromegaly, no lymphadenopathy.  Lungs:Clear bilaterally, no wheezes, rhonci, crackles Cardiovascular: Regular,. Loletha Grayer. No murmurs, gallops or rubs. Abdomen:Soft. Bowel sounds present. Non-tender.  Extremities: No lower extremity edema.   Labs    High Sensitivity Troponin:   Recent Labs  Lab 09/10/20 2216 09/11/20 0016   TROPONINIHS 9 38*      Chemistry Recent Labs  Lab 09/10/20 2216  NA 141  K 4.3  CL 105  CO2 26  GLUCOSE 99  BUN 14  CREATININE 1.18  CALCIUM 9.6  GFRNONAA >60  ANIONGAP 10     Hematology Recent Labs  Lab 09/10/20 2216 09/12/20 0150 09/13/20 0322  WBC 7.0 6.2 4.9  RBC 5.10 4.59 4.75  HGB 15.3 13.7 14.2  HCT 45.2 40.6 41.1  MCV 88.6 88.5 86.5  MCH 30.0 29.8 29.9  MCHC 33.8 33.7 34.5  RDW 13.4 13.3 13.1  PLT 161 141* 129*    BNPNo results for input(s): BNP, PROBNP in the last 168 hours.   DDimer No results for input(s): DDIMER in the last 168 hours.   Radiology    ECHOCARDIOGRAM COMPLETE  Result Date: 09/11/2020    ECHOCARDIOGRAM REPORT   Patient Name:   APOLONIO CUTTING Date of Exam: 09/11/2020 Medical Rec #:  [id number]         Height:       72.0 in Accession #:    [id number]        Weight:       261.5 lb Date of Birth:  01-Apr-1961  BSA:          2.388 m Patient Age:    6 years          BP:           96/64 mmHg Patient Gender: M                 HR:           61 bpm. Exam Location:  Inpatient Procedure: 2D Echo, Cardiac Doppler and Color Doppler Indications:    Acute myocardial infarction, unspecified I21.9  History:        Patient has prior history of Echocardiogram examinations. Prior                 CABG, Stroke; Risk Factors:Hypertension, Diabetes, Dyslipidemia                 and Former Smoker. 2014- Left Ventricular Pseudoanuerysm,                 Myopericarditis, Acute Respiratory Failure, Dresslers Syndrome                 with Pericardial Tamponade (status post Tap).  Sonographer:    Alvino Chapel RCS Referring Phys: [id number] [place]  1. Left ventricular ejection fraction, by estimation, is 25 to 30%. The left ventricle has severely decreased function. The left ventricle demonstrates regional wall motion abnormalities (see scoring diagram/findings for description). Left ventricular diastolic parameters are consistent with Grade  II diastolic dysfunction (pseudonormalization). Elevated left ventricular end-diastolic pressure. There is of the left ventricular, . There is of the left ventricular,.  2. Right ventricular systolic function was not well visualized. The right ventricular size is normal. Tricuspid regurgitation signal is inadequate for assessing PA pressure.  3. The mitral valve is grossly normal. Trivial mitral valve regurgitation.  4. The aortic valve is tricuspid. Aortic valve regurgitation is not visualized. No aortic stenosis is present.  5. The inferior vena cava is normal in size with <50% respiratory variability, suggesting right atrial pressure of 8 mmHg. Comparison(s): No significant change from prior study. FINDINGS  Left Ventricle: LV thrombus excluded by contrast. Left ventricular ejection fraction, by estimation, is 25 to 30%. The left ventricle has severely decreased function. The left ventricle demonstrates regional wall motion abnormalities. Definity contrast agent was given IV to delineate the left ventricular endocardial borders. The left ventricular internal cavity size was normal in size. There is borderline left ventricular hypertrophy. Left ventricular diastolic parameters are consistent with Grade II diastolic dysfunction (pseudonormalization). Elevated left ventricular end-diastolic pressure.  LV Wall Scoring: The entire inferior wall, mid inferoseptal segment, and basal inferoseptal segment are akinetic. The entire anterior wall, entire lateral wall, entire anterior septum, and apex are hypokinetic. Right Ventricle: The right ventricular size is normal. Right vetricular wall thickness was not well visualized. Right ventricular systolic function was not well visualized. Tricuspid regurgitation signal is inadequate for assessing PA pressure. Left Atrium: Left atrial size was not well visualized. Right Atrium: Right atrial size was not well visualized. Pericardium: Trivial pericardial effusion is present.  Mitral Valve: The mitral valve is grossly normal. Trivial mitral valve regurgitation. Tricuspid Valve: The tricuspid valve is grossly normal. Tricuspid valve regurgitation is trivial. Aortic Valve: The aortic valve is tricuspid. Aortic valve regurgitation is not visualized. No aortic stenosis is present. Pulmonic Valve: The pulmonic valve was not well visualized. Pulmonic valve regurgitation is not visualized. Aorta: The aortic root, ascending aorta and aortic arch are all structurally  normal, with no evidence of dilitation or obstruction. Venous: The inferior vena cava is normal in size with less than 50% respiratory variability, suggesting right atrial pressure of 8 mmHg. IAS/Shunts: The interatrial septum was not well visualized.  LEFT VENTRICLE PLAX 2D LVIDd:         5.22 cm  Diastology LVIDs:         4.80 cm  LV e' medial:    3.71 cm/s LV PW:         1.30 cm  LV E/e' medial:  20.9 LV IVS:        1.10 cm  LV e' lateral:   7.01 cm/s LVOT diam:     2.20 cm  LV E/e' lateral: 11.1 LV SV:         66 LV SV Index:   28 LVOT Area:     3.80 cm  RIGHT VENTRICLE RV S prime:     5.80 cm/s TAPSE (M-mode): 1.2 cm LEFT ATRIUM             Index       RIGHT ATRIUM           Index LA diam:        4.10 cm 1.72 cm/m  RA Area:     19.90 cm LA Vol (A2C):   93.1 ml 38.99 ml/m RA Volume:   54.00 ml  22.62 ml/m LA Vol (A4C):   68.3 ml 28.60 ml/m LA Biplane Vol: 80.7 ml 33.80 ml/m  AORTIC VALVE LVOT Vmax:   74.10 cm/s LVOT Vmean:  42.900 cm/s LVOT VTI:    0.173 m  AORTA Ao Root diam: 3.80 cm MITRAL VALVE MV Area (PHT): 3.56 cm    SHUNTS MV Decel Time: 213 msec    Systemic VTI:  0.17 m MV E velocity: 77.70 cm/s  Systemic Diam: 2.20 cm MV A velocity: 68.60 cm/s MV E/A ratio:  1.13 Buford Dresser MD Electronically signed by Buford Dresser MD Signature Date/Time: 09/11/2020/6:09:44 PM    Final     Cardiac Studies   ECHO 09/11/20: 1. Left ventricular ejection fraction, by estimation, is 25 to 30%. The  left  ventricle has severely decreased function. The left ventricle  demonstrates regional wall motion abnormalities (see scoring  diagram/findings for description). Left ventricular  diastolic parameters are consistent with Grade II diastolic dysfunction  (pseudonormalization). Elevated left ventricular end-diastolic pressure.  There is of the left ventricular, . There is of the left ventricular,.  2. Right ventricular systolic function was not well visualized. The right  ventricular size is normal. Tricuspid regurgitation signal is inadequate  for assessing PA pressure.  3. The mitral valve is grossly normal. Trivial mitral valve  regurgitation.  4. The aortic valve is tricuspid. Aortic valve regurgitation is not  visualized. No aortic stenosis is present.  5. The inferior vena cava is normal in size with <50% respiratory  variability, suggesting right atrial pressure of 8 mmHg.   Comparison(s): No significant change from prior study.  Patient Profile     60 y.o. male with ischemic cardiomyopathy, chronic systolic heart failure, EF 25% stable on echocardiogram during this admission, ICD, with coronary disease post CABG 2014 admitted with chest pain and mildly elevated troponin.   Assessment & Plan     1. CAD s/p CABG: He is admitted with chest pain c/w unstable angina with mildly elevated troponin. He is s/p CABG in 2014. He is chest pain free today. Echocardiogram with reduced ejection fraction of 25% which  is unchanged.  -Plans for cardiac cath today per weekend rounding team.  -Continue IV heparin, ASA, statin -Coreg held today  2. Chronic systolic heart failure secondary to ischemic cardiomyopathy: ICD in place. Continue Entresto. Coreg has been held secondary to bradycardia. Euvolemic on exam today.    For questions or updates, please contact [place] Please consult [email or web address] for contact info under        Signed, Lauree Chandler, MD  09/13/2020, 9:29 AM

## 2020-09-13 NOTE — Interval H&P Note (Signed)
History and Physical Interval Note:  09/13/2020 5:09 PM  [name]  has presented today for surgery, with the diagnosis of unstable angina.  The various methods of treatment have been discussed with the patient and family. After consideration of risks, benefits and other options for treatment, the patient has consented to  Procedure(s): RIGHT/LEFT HEART CATH AND CORONARY ANGIOGRAPHY (N/A) as a surgical intervention.  The patient's history has been reviewed, patient examined, no change in status, stable for surgery.  I have reviewed the patient's chart and labs.  Questions were answered to the patient's satisfaction.   Cath Lab Visit (complete for each Cath Lab visit)  Clinical Evaluation Leading to the Procedure:   ACS: Yes.    Non-ACS:    Anginal Classification: CCS III  Anti-ischemic medical therapy: Minimal Therapy (1 class of medications)  Non-Invasive Test Results: No non-invasive testing performed  Prior CABG: Previous CABG        [name] [facility] 09/13/2020 5:10 PM

## 2020-09-13 NOTE — Progress Notes (Signed)
Bruising and swelling observed on the left upper arm. Pt reports bruising and swelling is where a midline line attempt was performed. Swelling looks more prevalent since return from cath lab. IV team consult placed for further evaluation. Warm compress placed at site. Pt denies pain. Midline flushed with no complications.

## 2020-09-14 ENCOUNTER — Encounter (HOSPITAL_COMMUNITY): Payer: Self-pay | Admitting: Cardiology

## 2020-09-14 ENCOUNTER — Other Ambulatory Visit (HOSPITAL_COMMUNITY): Payer: Self-pay

## 2020-09-14 DIAGNOSIS — I200 Unstable angina: Secondary | ICD-10-CM | POA: Diagnosis not present

## 2020-09-14 LAB — CBC
HCT: 41.6 % (ref 39.0–52.0)
Hemoglobin: 14.5 g/dL (ref 13.0–17.0)
MCH: 29.8 pg (ref 26.0–34.0)
MCHC: 34.9 g/dL (ref 30.0–36.0)
MCV: 85.6 fL (ref 80.0–100.0)
Platelets: 130 10*3/uL — ABNORMAL LOW (ref 150–400)
RBC: 4.86 MIL/uL (ref 4.22–5.81)
RDW: 13 % (ref 11.5–15.5)
WBC: 4.9 10*3/uL (ref 4.0–10.5)
nRBC: 0 % (ref 0.0–0.2)

## 2020-09-14 MED ORDER — DAPAGLIFLOZIN PROPANEDIOL 10 MG PO TABS
10.0000 mg | ORAL_TABLET | Freq: Every day | ORAL | 1 refills | Status: DC
  Filled 2020-09-14: qty 30, 30d supply, fill #0

## 2020-09-14 MED ORDER — ENOXAPARIN SODIUM 40 MG/0.4ML IJ SOSY: 40.0000 mg | PREFILLED_SYRINGE | INTRAMUSCULAR | Status: DC

## 2020-09-14 MED ORDER — DAPAGLIFLOZIN PROPANEDIOL 10 MG PO TABS
10.0000 mg | ORAL_TABLET | Freq: Every day | ORAL | Status: DC
  Administered 2020-09-14: 10 mg via ORAL
  Filled 2020-09-14: qty 1

## 2020-09-14 MED ORDER — NITROGLYCERIN 0.4 MG SL SUBL
0.4000 mg | SUBLINGUAL_TABLET | SUBLINGUAL | 1 refills | Status: AC | PRN
  Filled 2020-09-14: qty 25, 7d supply, fill #0

## 2020-09-14 MED FILL — Heparin Sod (Porcine)-NaCl IV Soln 1000 Unit/500ML-0.9%: INTRAVENOUS | Qty: 500 | Status: AC

## 2020-09-14 NOTE — Plan of Care (Signed)

## 2020-09-14 NOTE — Progress Notes (Signed)
Heart Failure Stewardship Pharmacist Progress Note   PCP: Ladell Pier, MD PCP-Cardiologist: Peter Martinique, MD    HPI:  60 yo male with PMH CAD s/p CABG in 2014, ICM, HFrEF s/p ICD, HTN, and HLD. Presents with CP/NSTEMI. ECHO on 09/11/20 with LVEF 25-30% which is up slightly from 20-25% in March 2021. Of note, patient stopped Jardiance in the past due to nausea/vomiting. LHC done on 5/31 and found to have single vessel occlusive CAD with CTO of mid RCA, patent stent in mid LAD and LCx - recommended continued medical management.   Discharge HF Medications: PO furosemide 20 mg daily Carvedilol 25 mg BID Entresto 97/103 mg BID Spironolactone 25 mg daily Farxiga 10 mg daily  Prior to admission HF Medications: Carvedilol 25 mg BID Entresto 97/103 mg BID Spironolactone 25 mg daily PO furosemide 20 mg daily  Pertinent Lab Values: . As of 5/28: Serum creatinine 1.18, BUN 14, Potassium 4.3, Sodium 141   Vital Signs: . Weight: 255 lbs (admission weight: 257 lbs) . Blood pressure: 120s/70s  . Heart rate: 50-70s   Medication Assistance / Insurance Benefits Check: Does the patient have prescription insurance?  Yes Type of insurance plan: [employer] Medicare and [place] Medicaid  Outpatient Pharmacy:  Prior to admission outpatient pharmacy: [facility] Is the patient willing to use [place] at discharge? Yes Is the patient willing to transition their outpatient pharmacy to utilize a [facility] outpatient pharmacy?   Pending    Assessment/Plan: 1) Acute on chronic systolic CHF (EF 77-82%), due to ICM. NYHA class II symptoms. - Euvolemic on MD exam - continue PO furosemide 20 mg daily - Continue carvedilol 25 mg BID at discharge - Continue Entresto 97/103 mg BID - Continue spironolactone 25 mg daily - Spoke with patient and he is willing to try Farxiga 10 mg daily. Continue Farxiga 10 mg daily at discharge.   2) Patient assistance: - Farxiga copay $4/month  3)  Education  -  Patient has been educated on current HF medications and potential additions to HF medication regimen - Patient verbalizes understanding that over the next few months, these medication doses may change and more medications may be added to optimize HF regimen - Patient has been educated on basic disease state pathophysiology and goals of therapy - Time spent 15 mins  Kerby Nora, PharmD, BCPS Heart Failure Stewardship Pharmacist Phone [phone]

## 2020-09-14 NOTE — Discharge Instructions (Addendum)
Radial Site Care  This sheet gives you information about how to care for yourself after your procedure. Your health care provider may also give you more specific instructions. If you have problems or questions, contact your health care provider. What can I expect after the procedure? After the procedure, it is common to have:  Bruising and tenderness at the catheter insertion area. Follow these instructions at home: Medicines  Take over-the-counter and prescription medicines only as told by your health care provider. Insertion site care  Follow instructions from your health care provider about how to take care of your insertion site. Make sure you: ? Wash your hands with soap and water before you change your bandage (dressing). If soap and water are not available, use hand sanitizer. ? Change your dressing as told by your health care provider. ? Leave stitches (sutures), skin glue, or adhesive strips in place. These skin closures may need to stay in place for 2 weeks or longer. If adhesive strip edges start to loosen and curl up, you may trim the loose edges. Do not remove adhesive strips completely unless your health care provider tells you to do that.  Check your insertion site every day for signs of infection. Check for: ? Redness, swelling, or pain. ? Fluid or blood. ? Pus or a bad smell. ? Warmth.  Do not take baths, swim, or use a hot tub until your health care provider approves.  You may shower 24-48 hours after the procedure, or as directed by your health care provider. ? Remove the dressing and gently wash the site with plain soap and water. ? Pat the area dry with a clean towel. ? Do not rub the site. That could cause bleeding.  Do not apply powder or lotion to the site. Activity  For 24 hours after the procedure, or as directed by your health care provider: ? Do not flex or bend the affected arm. ? Do not push or pull heavy objects with the affected arm. ? Do not drive  yourself home from the hospital or clinic. You may drive 24 hours after the procedure unless your health care provider tells you not to. ? Do not operate machinery or power tools.  Do not lift anything that is heavier than 10 lb (4.5 kg), or the limit that you are told, until your health care provider says that it is safe.  Ask your health care provider when it is okay to: ? Return to work or school. ? Resume usual physical activities or sports. ? Resume sexual activity.   General instructions  If the catheter site starts to bleed, raise your arm and put firm pressure on the site. If the bleeding does not stop, get help right away. This is a medical emergency.  If you went home on the same day as your procedure, a responsible adult should be with you for the first 24 hours after you arrive home.  Keep all follow-up visits as told by your health care provider. This is important. Contact a health care provider if:  You have a fever.  You have redness, swelling, or yellow drainage around your insertion site. Get help right away if:  You have unusual pain at the radial site.  The catheter insertion area swells very fast.  The insertion area is bleeding, and the bleeding does not stop when you hold steady pressure on the area.  Your arm or hand becomes pale, cool, tingly, or numb. These symptoms may represent a serious   problem that is an emergency. Do not wait to see if the symptoms will go away. Get medical help right away. Call your local emergency services (911 in the [place] Do not drive yourself to the hospital. Summary  After the procedure, it is common to have bruising and tenderness at the site.  Follow instructions from your health care provider about how to take care of your radial site wound. Check the wound every day for signs of infection.  Do not lift anything that is heavier than 10 lb (4.5 kg), or the limit that you are told, until your health care provider says that it  is safe. This information is not intended to replace advice given to you by your health care provider. Make sure you discuss any questions you have with your health care provider. Document Revised: 05/08/2017 Document Reviewed: 05/08/2017 Elsevier Patient Education  2021 [employer]  

## 2020-09-14 NOTE — Plan of Care (Deleted)
  Problem: Clinical Measurements: Goal: Ability to maintain clinical measurements within normal limits will improve 09/14/2020 0144 by Colonel Bald, RN Outcome: Progressing 09/14/2020 0125 by Colonel Bald, RN Outcome: Progressing Goal: Will remain free from infection 09/14/2020 0144 by Colonel Bald, RN Outcome: Progressing 09/14/2020 0125 by Colonel Bald, RN Outcome: Progressing Goal: Diagnostic test results will improve 09/14/2020 0144 by Colonel Bald, RN Outcome: Progressing 09/14/2020 0125 by Colonel Bald, RN Outcome: Progressing Goal: Respiratory complications will improve 09/14/2020 0144 by Colonel Bald, RN Outcome: Progressing 09/14/2020 0125 by Colonel Bald, RN Outcome: Progressing Goal: Cardiovascular complication will be avoided 09/14/2020 0144 by Colonel Bald, RN Outcome: Progressing 09/14/2020 0125 by Colonel Bald, RN Outcome: Progressing

## 2020-09-14 NOTE — Progress Notes (Signed)
Pt is alert and oriented. Discharge instructions/ AVS given to pt. 

## 2020-09-14 NOTE — Progress Notes (Signed)
Dawn with NP that pt did not have MI or PCI so do not need to see pt this admission. Graylon Good RN BSN 09/14/2020 9:52 AM

## 2020-09-14 NOTE — Discharge Summary (Signed)
Discharge Summary    Patient ID: [name] MRN: [id number]; DOB: [birth year]  Admit date: 09/10/2020 Discharge date: 09/14/2020  PCP:  Robert Pier, MD   [facility] Providers Cardiologist:  Robert Martinique, MD   {  Discharge Diagnoses    Principal Problem:   Unstable angina [facility]) Active Problems:   Cardiomyopathy, ischemic   Essential hypertension   Hyperlipidemia    Diagnostic Studies/Procedures    Cath: 09/13/20   Prox LAD lesion is 40% stenosed.  Prox LAD to Mid LAD lesion is 10% stenosed.  Prox Cx to Mid Cx lesion is 20% stenosed.  Ost Cx to Prox Cx lesion is 50% stenosed.  Prox RCA to Mid RCA lesion is 100% stenosed.  SVG graft was not visualized due to known occlusion.  Origin to Prox Graft lesion is 100% stenosed.  LV end diastolic pressure is normal.   1. Single vessel occlusive CAD with CTO of the mid RCA.  2. Patent stents in the mid LAD and LCx.  3. No SVG to OM visualized but is felt to be occluded. 4. Low LVEDP 3 mm Hg  Plan: recommend continued medical management.   Diagnostic Dominance: Right    Echo: 09/11/20  ECHO 09/11/20: 1. Left ventricular ejection fraction, by estimation, is 25 to 30%. The  left ventricle has severely decreased function. The left ventricle  demonstrates regional wall motion abnormalities (see scoring  diagram/findings for description). Left ventricular  diastolic parameters are consistent with Grade II diastolic dysfunction  (pseudonormalization). Elevated left ventricular end-diastolic pressure.  There is of the left ventricular, . There is of the left ventricular,.  2. Right ventricular systolic function was not well visualized. The right  ventricular size is normal. Tricuspid regurgitation signal is inadequate  for assessing PA pressure.  3. The mitral valve is grossly normal. Trivial mitral valve  regurgitation.  4. The aortic valve is tricuspid. Aortic valve regurgitation is not   visualized. No aortic stenosis is present.  5. The inferior vena cava is normal in size with <50% respiratory  variability, suggesting right atrial pressure of 8 mmHg.   Comparison(s): No significant change from prior study. _____________   History of Present Illness     [name] is a 60 y.o. male with  CAD s/p CABG 2014, ischemic CM, HFrEF (25%) s/p ICD, HTN, HLD who presents with chest pain. [name] was at a family reunion the day prior to admission and had eaten many foods that he states he was told not to eat because of his cardiac history. He began to feel some L sided chest pain and L arm numbness that he had never felt before. These symptoms were slightly worsened with exertion and were persistent. He had no other associated symptoms. He denied any recent medication non-adherence, leg swelling, orthopnea, PND, ICD discharges, palpitations. He was brought to the ED for evaluation.   In the ED, the patient's vital signs were all within normal limits. His ECG revealed scattered T wave inversions that were similar to multiple prior ECGs. His initial troponin was 9 then follow up troponin 38. He remained chest pain free during his time in the ED. He was admitted for further treatment.   Hospital Course     1. Unstable Angina with mildly elevated troponin: hsTn peaked at 38. Underwent cardiac cath noted above with known CTO of the RCA, with patent stents in the Lcx/LAD. SVG-OM was mot visualized but felt to be occluded. Recommendations for continued medical management.  2. Chronic systolic HF 2/2 ICM with ICD: Known EF of 25%, no change on echo this admission. No signs of HF.  -- will continue on coreg, entresto and spiro -- had trouble with jardiance 2/2 nausea/vomiting, agreeable to try farxiga  3. HTN: stable with home regimen  4. HLD: on high dose statin  General: Well developed, well nourished, male appearing in no acute distress. Head: Normocephalic,  atraumatic.  Neck: Supple without bruits, JVD. Lungs:  Resp regular and unlabored, CTA. Heart: RRR, S1, S2, no S3, S4, or murmur; no rub. Abdomen: Soft, non-tender, non-distended with normoactive bowel sounds. No hepatomegaly. No rebound/guarding. No obvious abdominal masses. Extremities: No clubbing, cyanosis, edema. Distal pedal pulses are 2+ bilaterally. Right radial cath site stable without bruising or hematoma Neuro: Alert and oriented X 3. Moves all extremities spontaneously. Psych: Normal affect.  Patient was seen by Dr. Angelena Tucker and deemed stable for discharge home. Follow up in the office has been arranged. Medications sent to [facility]. Educated by pharmD prior to discharge.   Did the patient have an acute coronary syndrome (MI, NSTEMI, STEMI, etc) this admission?:  No.   The elevated Troponin was due to the acute medical illness (demand ischemia).      _____________  Discharge Vitals Blood pressure 121/73, pulse 60, temperature 97.8 F (36.6 C), temperature source Oral, resp. rate 17, height 6' (1.829 m), weight 115.9 kg, SpO2 98 %.  Filed Weights   09/10/20 2214 09/11/20 0634 09/13/20 1312  Weight: 116.6 kg 118.6 kg 115.9 kg    Labs & Radiologic Studies    CBC Recent Labs    09/13/20 0322 09/14/20 0606  WBC 4.9 4.9  HGB 14.2 14.5  HCT 41.1 41.6  MCV 86.5 85.6  PLT 129* 096*   Basic Metabolic Panel No results for input(s): NA, K, CL, CO2, GLUCOSE, BUN, CREATININE, CALCIUM, MG, PHOS in the last 72 hours. Liver Function Tests No results for input(s): AST, ALT, ALKPHOS, BILITOT, PROT, ALBUMIN in the last 72 hours. No results for input(s): LIPASE, AMYLASE in the last 72 hours. High Sensitivity Troponin:   Recent Labs  Lab 09/10/20 2216 09/11/20 0016  TROPONINIHS 9 38*    BNP Invalid input(s): POCBNP D-Dimer No results for input(s): DDIMER in the last 72 hours. Hemoglobin A1C No results for input(s): HGBA1C in the last 72 hours. Fasting Lipid Panel No  results for input(s): CHOL, HDL, LDLCALC, TRIG, CHOLHDL, LDLDIRECT in the last 72 hours. Thyroid Function Tests No results for input(s): TSH, T4TOTAL, T3FREE, THYROIDAB in the last 72 hours.  Invalid input(s): FREET3 _____________  DG Chest 2 View  Result Date: 09/10/2020 CLINICAL DATA:  Chest pain, history of coronary artery disease, left-sided chest pain and left arm numbness for 2 hours EXAM: CHEST - 2 VIEW COMPARISON:  06/07/2020 FINDINGS: Frontal and lateral views of the chest demonstrates single lead pacer/AICD unchanged. Cardiac silhouette is stable, with postsurgical changes from prior median sternotomy. No airspace disease, effusion, or pneumothorax. No acute bony abnormalities. IMPRESSION: 1. Stable exam, no acute process. Electronically Signed   By: Randa Ngo M.D.   On: 09/10/2020 22:34   CARDIAC CATHETERIZATION  Result Date: 09/13/2020  Prox LAD lesion is 40% stenosed.  Prox LAD to Mid LAD lesion is 10% stenosed.  Prox Cx to Mid Cx lesion is 20% stenosed.  Ost Cx to Prox Cx lesion is 50% stenosed.  Prox RCA to Mid RCA lesion is 100% stenosed.  SVG graft was not visualized due to known occlusion.  Origin to Prox Graft lesion is 100% stenosed.  LV end diastolic pressure is normal.  1. Single vessel occlusive CAD with CTO of the mid RCA. 2. Patent stents in the mid LAD and LCx. 3. No SVG to OM visualized but is felt to be occluded. 4. Low LVEDP 3 mm Hg Plan: recommend continued medical management.   ECHOCARDIOGRAM COMPLETE  Result Date: 09/11/2020    ECHOCARDIOGRAM REPORT   Patient Name:   ANTJUAN ROTHE Date of Exam: 09/11/2020 Medical Rec #:  [id number]         Height:       72.0 in Accession #:    [id number]        Weight:       261.5 lb Date of Birth:  [birth year]          BSA:          2.388 m Patient Age:    13 years          BP:           96/64 mmHg Patient Gender: M                 HR:           61 bpm. Exam Location:  Inpatient Procedure: 2D Echo, Cardiac Doppler and  Color Doppler Indications:    Acute myocardial infarction, unspecified I21.9  History:        Patient has prior history of Echocardiogram examinations. Prior                 CABG, Stroke; Risk Factors:Hypertension, Diabetes, Dyslipidemia                 and Former Smoker. 2014- Left Ventricular Pseudoanuerysm,                 Myopericarditis, Acute Respiratory Failure, Dresslers Syndrome                 with Pericardial Tamponade (status post Tap).  Sonographer:    Alvino Chapel RCS Referring Phys: [id number] [place]  1. Left ventricular ejection fraction, by estimation, is 25 to 30%. The left ventricle has severely decreased function. The left ventricle demonstrates regional wall motion abnormalities (see scoring diagram/findings for description). Left ventricular diastolic parameters are consistent with Grade II diastolic dysfunction (pseudonormalization). Elevated left ventricular end-diastolic pressure. There is of the left ventricular, . There is of the left ventricular,.  2. Right ventricular systolic function was not well visualized. The right ventricular size is normal. Tricuspid regurgitation signal is inadequate for assessing PA pressure.  3. The mitral valve is grossly normal. Trivial mitral valve regurgitation.  4. The aortic valve is tricuspid. Aortic valve regurgitation is not visualized. No aortic stenosis is present.  5. The inferior vena cava is normal in size with <50% respiratory variability, suggesting right atrial pressure of 8 mmHg. Comparison(s): No significant change from prior study. FINDINGS  Left Ventricle: LV thrombus excluded by contrast. Left ventricular ejection fraction, by estimation, is 25 to 30%. The left ventricle has severely decreased function. The left ventricle demonstrates regional wall motion abnormalities. Definity contrast agent was given IV to delineate the left ventricular endocardial borders. The left ventricular internal cavity size was  normal in size. There is borderline left ventricular hypertrophy. Left ventricular diastolic parameters are consistent with Grade II diastolic dysfunction (pseudonormalization). Elevated left ventricular end-diastolic pressure.  LV Wall Scoring: The entire inferior wall, mid inferoseptal segment, and basal inferoseptal segment  are akinetic. The entire anterior wall, entire lateral wall, entire anterior septum, and apex are hypokinetic. Right Ventricle: The right ventricular size is normal. Right vetricular wall thickness was not well visualized. Right ventricular systolic function was not well visualized. Tricuspid regurgitation signal is inadequate for assessing PA pressure. Left Atrium: Left atrial size was not well visualized. Right Atrium: Right atrial size was not well visualized. Pericardium: Trivial pericardial effusion is present. Mitral Valve: The mitral valve is grossly normal. Trivial mitral valve regurgitation. Tricuspid Valve: The tricuspid valve is grossly normal. Tricuspid valve regurgitation is trivial. Aortic Valve: The aortic valve is tricuspid. Aortic valve regurgitation is not visualized. No aortic stenosis is present. Pulmonic Valve: The pulmonic valve was not well visualized. Pulmonic valve regurgitation is not visualized. Aorta: The aortic root, ascending aorta and aortic arch are all structurally normal, with no evidence of dilitation or obstruction. Venous: The inferior vena cava is normal in size with less than 50% respiratory variability, suggesting right atrial pressure of 8 mmHg. IAS/Shunts: The interatrial septum was not well visualized.  LEFT VENTRICLE PLAX 2D LVIDd:         5.22 cm  Diastology LVIDs:         4.80 cm  LV e' medial:    3.71 cm/s LV PW:         1.30 cm  LV E/e' medial:  20.9 LV IVS:        1.10 cm  LV e' lateral:   7.01 cm/s LVOT diam:     2.20 cm  LV E/e' lateral: 11.1 LV SV:         66 LV SV Index:   28 LVOT Area:     3.80 cm  RIGHT VENTRICLE RV S prime:     5.80 cm/s  TAPSE (M-mode): 1.2 cm LEFT ATRIUM             Index       RIGHT ATRIUM           Index LA diam:        4.10 cm 1.72 cm/m  RA Area:     19.90 cm LA Vol (A2C):   93.1 ml 38.99 ml/m RA Volume:   54.00 ml  22.62 ml/m LA Vol (A4C):   68.3 ml 28.60 ml/m LA Biplane Vol: 80.7 ml 33.80 ml/m  AORTIC VALVE LVOT Vmax:   74.10 cm/s LVOT Vmean:  42.900 cm/s LVOT VTI:    0.173 m  AORTA Ao Root diam: 3.80 cm MITRAL VALVE MV Area (PHT): 3.56 cm    SHUNTS MV Decel Time: 213 msec    Systemic VTI:  0.17 m MV E velocity: 77.70 cm/s  Systemic Diam: 2.20 cm MV A velocity: 68.60 cm/s MV E/A ratio:  1.13 Buford Dresser MD Electronically signed by Buford Dresser MD Signature Date/Time: 09/11/2020/6:09:44 PM    Final    CUP PACEART REMOTE DEVICE CHECK  Result Date: 08/25/2020 Scheduled remote reviewed. Normal device function.  RP Next remote 91 days.  Disposition   Pt is being discharged home today in good condition.  Follow-up Plans & Appointments     Follow-up Information    [facility], [place] Follow up on 10/05/2020.   Specialties: Cardiology, Radiology Why: at 10:45am for your follow up appt with Dr. Doug Sou PA Contact information: [address] [place] [address] [phone]              Discharge Instructions    Diet - low sodium heart healthy  Complete by: As directed    Discharge instructions   Complete by: As directed    Radial Site Care Refer to this sheet in the next few weeks. These instructions provide you with information on caring for yourself after your procedure. Your caregiver may also give you more specific instructions. Your treatment has been planned according to current medical practices, but problems sometimes occur. Call your caregiver if you have any problems or questions after your procedure. HOME CARE INSTRUCTIONS You may shower the day after the procedure.Remove the bandage (dressing) and gently wash the site with plain soap and water.Gently  pat the site dry.  Do not apply powder or lotion to the site.  Do not submerge the affected site in water for 3 to 5 days.  Inspect the site at least twice daily.  Do not flex or bend the affected arm for 24 hours.  No lifting over 5 pounds (2.3 kg) for 5 days after your procedure.  Do not drive home if you are discharged the same day of the procedure. Have someone else drive you.  You may drive 24 hours after the procedure unless otherwise instructed by your caregiver.  What to expect: Any bruising will usually fade within 1 to 2 weeks.  Blood that collects in the tissue (hematoma) may be painful to the touch. It should usually decrease in size and tenderness within 1 to 2 weeks.  SEEK IMMEDIATE MEDICAL CARE IF: You have unusual pain at the radial site.  You have redness, warmth, swelling, or pain at the radial site.  You have drainage (other than a small amount of blood on the dressing).  You have chills.  You have a fever or persistent symptoms for more than 72 hours.  You have a fever and your symptoms suddenly get worse.  Your arm becomes pale, cool, tingly, or numb.  You have heavy bleeding from the site. Hold pressure on the site.   Increase activity slowly   Complete by: As directed       Discharge Medications   Allergies as of 09/14/2020   No Known Allergies     Medication List    STOP taking these medications   empagliflozin 10 MG Tabs tablet Commonly known as: Jardiance     TAKE these medications   Alphagan P 0.1 % Soln Generic drug: brimonidine INSTILL 1 DROP INTO BOTH EYES TWICE A DAY   aspirin 81 MG EC tablet Take 1 tablet (81 mg total) by mouth daily. Restart on 07/05/2018 What changed: additional instructions   atorvastatin 80 MG tablet Commonly known as: LIPITOR TAKE 1 TABLET BY MOUTH EVERY DAY   carvedilol 25 MG tablet Commonly known as: COREG TAKE 1 TABLET BY MOUTH TWICE A DAY WITH A MEAL What changed:   See the new instructions.  Another  medication with the same name was removed. Continue taking this medication, and follow the directions you see here.   dapagliflozin propanediol 10 MG Tabs tablet Commonly known as: FARXIGA Take 1 tablet (10 mg total) by mouth daily.   Entresto 97-103 MG Generic drug: sacubitril-valsartan TAKE 1 TABLET BY MOUTH TWICE A DAY   furosemide 20 MG tablet Commonly known as: LASIX TAKE 1 TABLET BY MOUTH EVERY DAY   gabapentin 300 MG capsule Commonly known as: NEURONTIN TAKE 1 CAPSULE BY MOUTH EVERYDAY AT BEDTIME What changed:   how much to take  how to take this  when to take this  additional instructions   latanoprost 0.005 %  ophthalmic solution Commonly known as: XALATAN Place 1 drop into both eyes at bedtime.   methocarbamol 500 MG tablet Commonly known as: ROBAXIN TAKE 1 TABLET (500 MG TOTAL) BY MOUTH 2 (TWO) TIMES DAILY AS NEEDED FOR MUSCLE SPASMS.   multivitamin with minerals Tabs tablet Take 1 tablet by mouth daily. Centrum Silver   nicotine 21 mg/24hr patch Commonly known as: NICODERM CQ - dosed in mg/24 hours Place 1 patch (21 mg total) onto the skin daily.   nitroGLYCERIN 0.4 MG SL tablet Commonly known as: NITROSTAT Place 1 tablet (0.4 mg total) under the tongue every 5 (five) minutes x 3 doses as needed for chest pain.   spironolactone 25 MG tablet Commonly known as: ALDACTONE Take 1 tablet (25 mg total) by mouth daily.   traMADol 50 MG tablet Commonly known as: ULTRAM Take 1 tablet (50 mg total) by mouth every 8 (eight) hours as needed. What changed: reasons to take this   Vitamin D 125 MCG (5000 UT) Caps Take 5,000 Units by mouth daily.       Outstanding Labs/Studies   N/a   Duration of Discharge Encounter   Greater than 30 minutes including physician time.  Signed, Reino Bellis, NP 09/14/2020, 10:47 AM   I have personally seen and examined this patient. I agree with the assessment and plan as outlined above.  CAD stable by cath  yesterday. No chest pain this am. Continue medical management of CAD.  Discharge home today.   Reino Bellis 09/14/2020 10:47 AM

## 2020-09-15 ENCOUNTER — Ambulatory Visit: Payer: Self-pay | Admitting: *Deleted

## 2020-09-15 ENCOUNTER — Telehealth: Payer: Self-pay

## 2020-09-15 ENCOUNTER — Telehealth: Payer: Self-pay | Admitting: Pharmacist

## 2020-09-15 DIAGNOSIS — G894 Chronic pain syndrome: Secondary | ICD-10-CM | POA: Diagnosis not present

## 2020-09-15 DIAGNOSIS — R202 Paresthesia of skin: Secondary | ICD-10-CM | POA: Diagnosis not present

## 2020-09-15 DIAGNOSIS — R200 Anesthesia of skin: Secondary | ICD-10-CM | POA: Diagnosis not present

## 2020-09-15 DIAGNOSIS — R6889 Other general symptoms and signs: Secondary | ICD-10-CM | POA: Diagnosis not present

## 2020-09-15 DIAGNOSIS — M5416 Radiculopathy, lumbar region: Secondary | ICD-10-CM | POA: Diagnosis not present

## 2020-09-15 DIAGNOSIS — M47816 Spondylosis without myelopathy or radiculopathy, lumbar region: Secondary | ICD-10-CM | POA: Diagnosis not present

## 2020-09-15 DIAGNOSIS — M5136 Other intervertebral disc degeneration, lumbar region: Secondary | ICD-10-CM | POA: Diagnosis not present

## 2020-09-15 NOTE — Telephone Encounter (Signed)
Transition Care Management Follow-up Telephone Call  Date of discharge and from where: 09/14/2020, [facility]   How have you been since you were released from the hospital? He stated that he is feeling a lot better  Any questions or concerns? Yes- he was concerned about the first page of AVS noting there is a change in carvedilol order. He was not sure if he should be taking it. Explained to him that on page 4 of his AVS, it is listed  - Carvedilol 25 mg twice daily with a meal.  - "continue taking this medication and follow the directions you see here" and he said he understood   Items Reviewed:  Did the pt receive and understand the discharge instructions provided? Yes   Medications obtained and verified? Yes  - he said he has all medications and the only question was regarding carvedilol noted above.   Other? No   Any new allergies since your discharge? No   Do you have support at home? Yes  - He also stated that he has a PCS aide 2 hours x 7 days/week  Home Care and Equipment/Supplies: Were home health services ordered? no If so, what is the name of the agency? n/a  Has the agency set up a time to come to the patient's home? not applicable Were any new equipment or medical supplies ordered?  No What is the name of the medical supply agency? n/a Were you able to get the supplies/equipment? not applicable Do you have any questions related to the use of the equipment or supplies? No  Functional Questionnaire: (I = Independent and D = Dependent) ADLs: independent. Has cane to use if needed.  His aide also provides assistance if needed    Follow up appointments reviewed:   PCP Hospital f/u appt confirmed? Yes  - Dr Wynetta Emery 10/10/2020 @ [facility] f/u appt confirmed? Yes cardiology - 10/05/2020   Are transportation arrangements needed? No   If their condition worsens, is the pt aware to call PCP or go to the Emergency Dept.? Yes  Was the patient  provided with contact information for the PCP's office or ED? Yes  Was to pt encouraged to call back with questions or concerns? Yes

## 2020-09-15 NOTE — Telephone Encounter (Signed)
Unable to reach pt. Left VM with instructions to continue taking carvedilol 25 mg BID per hospitalist's note.

## 2020-09-15 NOTE — Telephone Encounter (Signed)
  Pt called in questioning how he should be taking the Coreg 25 mg tablets.  His hospital discharge summary just states,  "Change how to take" under the Coreg drug name and that's all.   No further directions. The original directions is 1 25 mg tablet twice a day with a meal.  Should he continue taking it this way?  See notes below.  I have sent his request to [facility] to Dr. Karle Plumber high priority.  Pt was agreeable to someone calling him back with Dr. Durenda Age answer.   Reason for Disposition . [1] Caller has URGENT medicine question about med that PCP or specialist prescribed AND [2] triager unable to answer question    Just out of the hospital.   How should he take the Coreg 25 mg?  Answer Assessment - Initial Assessment Questions 1. NAME of MEDICATION: "What medicine are you calling about?"     I just got out of the hospital.   My medications have been changed. 2. QUESTION: "What is your question?" (e.g., double dose of medicine, side effect)     Coreg 25 mg. 3. PRESCRIBING HCP: "Who prescribed it?" Reason: if prescribed by specialist, call should be referred to that group.     Dr. In the hospital 4. SYMPTOMS: "Do you have any symptoms?"     How am I supposed to take that?   My discharge instructions say to change how I'm taking it but that's all.   No further directions.  Am I suppose to take 1 pill twice a day with meals?   That's how I was taking it before being in the hospital. 5. SEVERITY: If symptoms are present, ask "Are they mild, moderate or severe?"     N/A 6. PREGNANCY:  "Is there any chance that you are pregnant?" "When was your last menstrual period?"     N/A  I will check with Dr. Karle Plumber and have someone call you with her clarifying answer.  Protocols used: MEDICATION QUESTION CALL-A-AH

## 2020-09-15 NOTE — Progress Notes (Signed)
Remote ICD transmission.   

## 2020-09-15 NOTE — Telephone Encounter (Signed)
[name] could you follow up with pt on this please

## 2020-09-20 DIAGNOSIS — H401132 Primary open-angle glaucoma, bilateral, moderate stage: Secondary | ICD-10-CM | POA: Diagnosis not present

## 2020-09-28 DIAGNOSIS — M47816 Spondylosis without myelopathy or radiculopathy, lumbar region: Secondary | ICD-10-CM | POA: Diagnosis not present

## 2020-09-28 DIAGNOSIS — M6281 Muscle weakness (generalized): Secondary | ICD-10-CM | POA: Diagnosis not present

## 2020-09-28 DIAGNOSIS — M5416 Radiculopathy, lumbar region: Secondary | ICD-10-CM | POA: Diagnosis not present

## 2020-09-28 DIAGNOSIS — R29898 Other symptoms and signs involving the musculoskeletal system: Secondary | ICD-10-CM | POA: Diagnosis not present

## 2020-09-28 DIAGNOSIS — G894 Chronic pain syndrome: Secondary | ICD-10-CM | POA: Diagnosis not present

## 2020-09-28 DIAGNOSIS — M5136 Other intervertebral disc degeneration, lumbar region: Secondary | ICD-10-CM | POA: Diagnosis not present

## 2020-10-04 ENCOUNTER — Telehealth: Payer: Self-pay

## 2020-10-04 DIAGNOSIS — M6281 Muscle weakness (generalized): Secondary | ICD-10-CM | POA: Diagnosis not present

## 2020-10-04 DIAGNOSIS — M5416 Radiculopathy, lumbar region: Secondary | ICD-10-CM | POA: Diagnosis not present

## 2020-10-04 DIAGNOSIS — M5136 Other intervertebral disc degeneration, lumbar region: Secondary | ICD-10-CM | POA: Diagnosis not present

## 2020-10-04 DIAGNOSIS — R29898 Other symptoms and signs involving the musculoskeletal system: Secondary | ICD-10-CM | POA: Diagnosis not present

## 2020-10-04 DIAGNOSIS — G894 Chronic pain syndrome: Secondary | ICD-10-CM | POA: Diagnosis not present

## 2020-10-04 DIAGNOSIS — M47816 Spondylosis without myelopathy or radiculopathy, lumbar region: Secondary | ICD-10-CM | POA: Diagnosis not present

## 2020-10-04 NOTE — Telephone Encounter (Signed)
Pt referred to [facility] clinic by Dr Rayann Heman. Spoke with patient and provided ICM intro. Pt agreeable to monthly ICM follow up.  1st ICM remote transmission scheduled for 10/10/2020.  He reports he is feeling okay today and is starting PT to help with strength.  He does have some tiredness.  No complaints of fluid symptoms.  Provided ICM direct number and encouraged to call if he develops any fluid symptoms.

## 2020-10-04 NOTE — Telephone Encounter (Signed)
-----   Message from Thompson Grayer, MD sent at 08/26/2020  2:33 PM EDT ----- Recent ICD implant.  Please enroll in [facility] clinic.  He is interested.

## 2020-10-05 ENCOUNTER — Encounter: Payer: Self-pay | Admitting: Physician Assistant

## 2020-10-05 ENCOUNTER — Other Ambulatory Visit: Payer: Self-pay

## 2020-10-05 ENCOUNTER — Ambulatory Visit (INDEPENDENT_AMBULATORY_CARE_PROVIDER_SITE_OTHER): Payer: Medicare HMO | Admitting: Physician Assistant

## 2020-10-05 VITALS — BP 140/77 | HR 67 | Ht 73.0 in | Wt 261.0 lb

## 2020-10-05 DIAGNOSIS — E785 Hyperlipidemia, unspecified: Secondary | ICD-10-CM

## 2020-10-05 DIAGNOSIS — Z8673 Personal history of transient ischemic attack (TIA), and cerebral infarction without residual deficits: Secondary | ICD-10-CM | POA: Diagnosis not present

## 2020-10-05 DIAGNOSIS — I10 Essential (primary) hypertension: Secondary | ICD-10-CM

## 2020-10-05 DIAGNOSIS — Z95810 Presence of automatic (implantable) cardiac defibrillator: Secondary | ICD-10-CM

## 2020-10-05 DIAGNOSIS — R7303 Prediabetes: Secondary | ICD-10-CM | POA: Diagnosis not present

## 2020-10-05 DIAGNOSIS — I25810 Atherosclerosis of coronary artery bypass graft(s) without angina pectoris: Secondary | ICD-10-CM

## 2020-10-05 NOTE — Patient Instructions (Signed)
Medication Instructions:  Your physician recommends that you continue on your current medications as directed. Please refer to the Current Medication list given to you today.  *If you need a refill on your cardiac medications before your next appointment, please call your pharmacy*  Lab Work: Your physician recommends that you return for lab work in 3 months:  Fasting Lipid Panel-DO NOT EAT OR DRINK PAST MIDNIGHT. Hepatic (Liver) Function Test  If you have labs (blood work) drawn today and your tests are completely normal, you will receive your results only by: MyChart Message (if you have MyChart) OR A paper copy in the mail If you have any lab test that is abnormal or we need to change your treatment, we will call you to review the results.   Testing/Procedures: NONE ordered at this time of appointment    Follow-Up: At [facility], you and your health needs are our priority.  As part of our continuing mission to provide you with exceptional heart care, we have created designated Provider Care Teams.  These Care Teams include your primary Cardiologist (physician) and Advanced Practice Providers (APPs -  Physician Assistants and Nurse Practitioners) who all work together to provide you with the care you need, when you need it.  We recommend signing up for the patient portal called "MyChart".  Sign up information is provided on this After Visit Summary.  MyChart is used to connect with patients for Virtual Visits (Telemedicine).  Patients are able to view lab/test results, encounter notes, upcoming appointments, etc.  Non-urgent messages can be sent to your provider as well.   To learn more about what you can do with MyChart, go to [email or web address].    Your next appointment:   3-5 month(s)  The format for your next appointment:   In Person  Provider:   Peter Martinique, MD  Other Instructions

## 2020-10-05 NOTE — Progress Notes (Signed)
Cardiology Office Note:    Date:  10/07/2020   ID:  [name], DOB February 21, 1961, MRN [id number]  PCP:  Ladell Pier, MD   [facility] Providers Cardiologist:  Peter Martinique, MD Electrophysiologist:  Thompson Grayer, MD     Referring MD: Ladell Pier, MD   Chief Complaint  Patient presents with   Follow-up    Seen for Dr. Martinique     History of Present Illness:    [name] is a 60 y.o. male with a hx of CAD s/p CABG 2014, ischemic cardiomyopathy s/p ICD, HTN, HLD, prediabetes, history of cardiac tamponade, pericarditis and history of CVA.  He had a stent to left circumflex and RCA in 2007.  Repeat cardiac catheterization in September 2014 revealed totally occluded RCA, 50% in-stent restenosis of the left circumflex stent, medical therapy was recommended.  He was admitted in October 2014 due to large pericardial effusion and tamponade due to ruptured LV pseudoaneurysm.  Pericardial drain was placed.  Unfortunately prior to the discharge, he developed signs of stroke with left arm weakness in the left visual field deficit.  He then developed hemodynamic collapse and required intubation.  Repeat echocardiogram showed complex pericardial effusion with right ventricular collapse and tamponade.  He underwent emergent median sternotomy and repair of the ruptured inferoseptal posterior LV pseudoaneurysm with CorMatrix patch and CABG x1 with SVG to OM by Dr. Roxan Hockey.  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  Echocardiogram at the time was 25% with moderate pericardial effusion.  By February 2017, ejection fraction has improved to 30 to 35%.  Due to persistently low ejection fraction despite optimized medical therapy, he underwent ICD implantation by Dr. Rayann Heman on 05/25/2020.  More recently, patient presented to the hospital on 09/02/2020 with chest pain.  EKG was unchanged.   First troponin was negative however second troponin went up to 38.  He did have hypotension, carvedilol and Entresto were held on arrival.  Subsequent echocardiogram obtained on 09/11/2020 showed EF 25 to 30%, grade 2 DD, trivial MR.  Patient underwent cardiac catheterization on 09/13/2020 that showed chronically occluded proximal to mid RCA, 40% proximal LAD, 50% ostial to proximal left circumflex lesion, no SVG to OM was visualized and was felt to be occluded, LVEDP was normal.  Medical therapy was recommended.  He was placed on Farxiga as he had nausea and vomiting with Jardiance.  Carvedilol and Entresto were restarted prior to discharge.  Patient presents today for follow-up.  Since discharge, blood pressure has improved.  Blood pressure today was 140/77.  He denies any recent chest discomfort.  He is tolerating [place] without any issue.  Overall, patient is doing very well from cardiac perspective and can follow-up with Dr. Martinique in 3 to 5 months.  He will need a fasting lipid panel and LFT in 3 months.  LDL was elevated in November.   Past Medical History:  Diagnosis Date   Abnormal EKG    hx of ischemia showing up on ekg's   Acute myopericarditis    a. 03/2013: readm with hypoxia, tachycardia, elevated ESR/CRP, elevated troponin with Dressler syndrome and myopericarditis; treated with steroids.   Acute respiratory failure ([place])    a. 01/2013: VDRF. b. 03/2013: hypoxia requiring supp O2 during adm, resolved by discharge.   C. difficile colitis    a. 01/2013 during prolonged adm. b. Recurred 03/2013.   Cardiac tamponade  a. 01/2013 s/p drain.   Cataracts, bilateral    Coronary artery disease    a. s/p MI in 2009 in [place] with stenting of the LCX and LAD;  b. 12/2012 NSTEMI/CAD: LM nl, LAD patent prox stent, LCX 50-70 isr (FFR 0.84), RCA dom, 175m EF 40-45%-->Med Rx. c. 01/2013: anterolateral STEMI complicated by pericardial effusion (presumed purulent pericarditis) and tamponade s/p drain,  ruptured LV pseudoaneurysm s/p CorMatrix patch, CABGx1 (SVG-OM1), fever, CVA, VDRF, C diff.   CVA (cerebral infarction)    a. 01/2013 in setting of prolonged hospitalization - residual L arm weakness.   Dressler syndrome [place]    a. 03/2013: readm with hypoxia, tachycardia, elevated ESR/CRP, elevated troponin with Dressler syndrome and myopericarditis; treated with steroids.   Glaucoma    Hyperlipidemia    Hypokalemia    Hyponatremia    a. During late 2014.   Ischemic cardiomyopathy    a. Sept/Oct 2014: EF ~40% (ICM). b. 03/2013: EF 25-30%. Off ACEI due to hypotension (MIXED NICM/ICM).   Optic neuropathy, ischemic    Pericardial effusion    a. 01/2013: pericardial effusion (presumed purulent pericarditis) and cardiac tamponade s/p drain. b. Persistent moderate pericardial effusion 03/2013.   Pre-diabetes    Pseudoaneurysm of left ventricle of heart    a. 01/2013:  Ruptured inferoposterior LV pseudoaneurysm s/p CorMatrix patch.   Sinus bradycardia    asymptomatic   Stroke ([facility] 2009   weak lt side-lt arm   Tobacco abuse    Wears dentures    top    Past Surgical History:  Procedure Laterality Date   CARDIAC CATHETERIZATION  01/06/2013   COLONOSCOPY WITH PROPOFOL N/A 07/03/2016   Procedure: COLONOSCOPY WITH PROPOFOL;  Surgeon: SManus Gunning MD;  Location: [facility];  Service: Gastroenterology;  Laterality: N/A;   CORONARY ANGIOPLASTY WITH STENT PLACEMENT  2000's X 2   "1 + 1" (01/06/2013)   CORONARY ARTERY BYPASS GRAFT N/A 01/28/2013   Procedure: CORONARY ARTERY BYPASS GRAFTING (CABG);  Surgeon: SMelrose Nakayama MD;  [place] [facility]  Service: Open Heart Surgery;  Laterality: N/A;  CABG x one, using left greater saphenous vein harvested endoscopically   ICD IMPLANT N/A 05/25/2020   Procedure: ICD IMPLANT;  Surgeon: AThompson Grayer MD;  [facility];  Service: Cardiovascular;  Laterality: N/A;   INTRAOPERATIVE TRANSESOPHAGEAL ECHOCARDIOGRAM N/A 01/28/2013    Procedure: INTRAOPERATIVE TRANSESOPHAGEAL ECHOCARDIOGRAM;  Surgeon: SMelrose Nakayama MD;  [place] [facility]  Service: Open Heart Surgery;  Laterality: N/A;   LEFT HEART CATH AND CORS/GRAFTS ANGIOGRAPHY N/A 09/13/2020   Procedure: LEFT HEART CATH AND CORS/GRAFTS ANGIOGRAPHY;  Surgeon: JMartinique Peter M, MD;  [facility];  Service: Cardiovascular;  Laterality: N/A;   LEFT HEART CATHETERIZATION WITH CORONARY ANGIOGRAM N/A 01/06/2013   Procedure: LEFT HEART CATHETERIZATION WITH CORONARY ANGIOGRAM;  Surgeon: Peter M JMartinique MD;  [facility];  Service: Cardiovascular;  Laterality: N/A;   LEFT HEART CATHETERIZATION WITH CORONARY ANGIOGRAM N/A 01/21/2013   Procedure: LEFT HEART CATHETERIZATION WITH CORONARY ANGIOGRAM;  Surgeon: CBurnell Blanks MD;  [facility];  Service: Cardiovascular;  Laterality: N/A;   LUMBAR LAMINECTOMY/ DECOMPRESSION WITH MET-RX Right 06/30/2018   Procedure: Right minimally invasive Lumbar Three-Four Far lateral discectomy;  Surgeon: OJudith Part MD;  [place] [facility]  Service: Neurosurgery;  Laterality: Right;  Right minimally invasive Lumbar Three-Four Far lateral discectomy   MASS EXCISION Right 07/21/2014   Procedure: EXCISION RIGHT NECK MASS;  Surgeon: TErroll Luna MD;  Location: [place];  Service: General;  Laterality: Right;   PERICARDIAL TAP N/A 01/24/2013   Procedure: PERICARDIAL TAP;  Surgeon: Blane Ohara, MD;  Location: [facility] CATH LAB;  Service: Cardiovascular;  Laterality: N/A;   RIGHT HEART CATHETERIZATION Right 01/24/2013   Procedure: RIGHT HEART CATH;  Surgeon: Blane Ohara, MD;  [facility];  Service: Cardiovascular;  Laterality: Right;   VENTRICULAR ANEURYSM RESECTION N/A 01/28/2013   Procedure: LEFT VENTRICULAR ANEURYSM REPAIR;  Surgeon: Melrose Nakayama, MD;  Location: [place];  Service: Open Heart Surgery;  Laterality: N/A;    Current Medications: Current Meds  Medication  Sig   ALPHAGAN P 0.1 % SOLN INSTILL 1 DROP INTO BOTH EYES TWICE A DAY (Patient taking differently: Place 1 drop into both eyes 2 (two) times daily.)   aspirin 81 MG EC tablet Take 1 tablet (81 mg total) by mouth daily. Restart on 07/05/2018 (Patient taking differently: Take 81 mg by mouth daily.)   atorvastatin (LIPITOR) 80 MG tablet TAKE 1 TABLET BY MOUTH EVERY DAY (Patient taking differently: Take 80 mg by mouth daily.)   carvedilol (COREG) 25 MG tablet TAKE 1 TABLET BY MOUTH TWICE A DAY WITH A MEAL (Patient taking differently: Take 25 mg by mouth 2 (two) times daily with a meal.)   Cholecalciferol (VITAMIN D) 125 MCG (5000 UT) CAPS Take 5,000 Units by mouth daily.   dapagliflozin propanediol (FARXIGA) 10 MG TABS tablet Take 1 tablet (10 mg total) by mouth daily.   ENTRESTO 97-103 MG TAKE 1 TABLET BY MOUTH TWICE A DAY (Patient taking differently: Take 1 tablet by mouth 2 (two) times daily.)   furosemide (LASIX) 20 MG tablet TAKE 1 TABLET BY MOUTH EVERY DAY (Patient taking differently: Take 20 mg by mouth daily.)   gabapentin (NEURONTIN) 300 MG capsule TAKE 1 CAPSULE BY MOUTH EVERYDAY AT BEDTIME (Patient taking differently: Take 300 mg by mouth at bedtime.)   latanoprost (XALATAN) 0.005 % ophthalmic solution Place 1 drop into both eyes at bedtime.   methocarbamol (ROBAXIN) 500 MG tablet TAKE 1 TABLET (500 MG TOTAL) BY MOUTH 2 (TWO) TIMES DAILY AS NEEDED FOR MUSCLE SPASMS.   Multiple Vitamin (MULTIVITAMIN WITH MINERALS) TABS tablet Take 1 tablet by mouth daily. Centrum Silver   nicotine (NICODERM CQ - DOSED IN MG/24 HOURS) 21 mg/24hr patch Place 1 patch (21 mg total) onto the skin daily.   nitroGLYCERIN (NITROSTAT) 0.4 MG SL tablet Place 1 tablet (0.4 mg total) under the tongue every 5 (five) minutes x 3 doses as needed for chest pain.   traMADol (ULTRAM) 50 MG tablet Take 1 tablet (50 mg total) by mouth every 8 (eight) hours as needed. (Patient taking differently: Take 50 mg by mouth every 8 (eight)  hours as needed for moderate pain.)     Allergies:   Patient has no known allergies.   Social History   Socioeconomic History   Marital status: Single    Spouse name: Not on file   Number of children: 2   Years of education: 12th   Highest education level: Not on file  Occupational History   Occupation: N/A  Tobacco Use   Smoking status: Every Day    Packs/day: 1.00    Years: 35.00    Pack years: 35.00    Types: Cigarettes   Smokeless tobacco: Never  Vaping Use   Vaping Use: Never used  Substance and Sexual Activity   Alcohol use: No    Alcohol/week: 0.0 standard  drinks   Drug use: No   Sexual activity: Yes  Other Topics Concern   Not on file  Social History Narrative   Lives in [place] with wife.  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  He is now retired on disability.  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      Caffeine Use: very rarely   Social Determinants of Radio broadcast assistant Strain: Not on file  Food Insecurity: Not on file  Transportation Needs: Not on file  Physical Activity: Not on file  Stress: Not on file  Social Connections: Not on file     Family History: The patient's family history includes CAD in an other family member; Diabetes in his mother; HIV in his brother; Heart attack in his mother; Hypertension in his father and mother. There is no history of Stroke.  ROS:   Please see the history of present illness.     All other systems reviewed and are negative.  EKGs/Labs/Other Studies Reviewed:    The following studies were reviewed today:  Echo 09/11/2020 1. Left ventricular ejection fraction, by estimation, is 25 to 30%. The  left ventricle has severely decreased function. The left ventricle  demonstrates regional wall motion abnormalities (see scoring  diagram/findings for description). Left ventricular  diastolic parameters are consistent with Grade II diastolic dysfunction  (pseudonormalization). Elevated left  ventricular end-diastolic pressure.  There is of the left ventricular, . There is of the left ventricular,.   2. Right ventricular systolic function was not well visualized. The right  ventricular size is normal. Tricuspid regurgitation signal is inadequate  for assessing PA pressure.   3. The mitral valve is grossly normal. Trivial mitral valve  regurgitation.   4. The aortic valve is tricuspid. Aortic valve regurgitation is not  visualized. No aortic stenosis is present.   5. The inferior vena cava is normal in size with <50% respiratory  variability, suggesting right atrial pressure of 8 mmHg.   Comparison(s): No significant change from prior study.     Cath 09/13/2020 Prox LAD lesion is 40% stenosed. Prox LAD to Mid LAD lesion is 10% stenosed. Prox Cx to Mid Cx lesion is 20% stenosed. Ost Cx to Prox Cx lesion is 50% stenosed. Prox RCA to Mid RCA lesion is 100% stenosed. SVG graft was not visualized due to known occlusion. Origin to Prox Graft lesion is 100% stenosed. LV end diastolic pressure is normal.   1. Single vessel occlusive CAD with CTO of the mid RCA. 2. Patent stents in the mid LAD and LCx. 3. No SVG to OM visualized but is felt to be occluded. 4. Low LVEDP 3 mm Hg   Plan: recommend continued medical management.  EKG:  EKG is not ordered today.    Recent Labs: 02/29/2020: ALT 15 09/10/2020: BUN 14; Creatinine, Ser 1.18; Potassium 4.3; Sodium 141 09/14/2020: Hemoglobin 14.5; Platelets 130  Recent Lipid Panel    Component Value Date/Time   CHOL 162 02/29/2020 1642   TRIG 110 02/29/2020 1642   HDL 57 02/29/2020 1642   CHOLHDL 2.8 02/29/2020 1642   CHOLHDL 2.5 11/03/2015 1609   VLDL 19 11/03/2015 1609   LDLCALC 85 02/29/2020 1642     Risk Assessment/Calculations:           Physical Exam:    VS:  BP 140/77   Pulse 67   Ht 6' 1"  (1.854 m)   Wt 261 lb (118.4 kg)   SpO2 93%  BMI 34.43 kg/m     Wt Readings from Last 3 Encounters:  10/05/20 261 lb  (118.4 kg)  09/13/20 255 lb 8.2 oz (115.9 kg)  08/26/20 265 lb 9.6 oz (120.5 kg)     GEN:  Well nourished, well developed in no acute distress HEENT: Normal NECK: No JVD; No carotid bruits LYMPHATICS: No lymphadenopathy CARDIAC: RRR, no murmurs, rubs, gallops RESPIRATORY:  Clear to auscultation without rales, wheezing or rhonchi  ABDOMEN: Soft, non-tender, non-distended MUSCULOSKELETAL:  No edema; No deformity  SKIN: Warm and dry NEUROLOGIC:  Alert and oriented x 3 PSYCHIATRIC:  Normal affect   ASSESSMENT:    1. Coronary artery disease involving coronary bypass graft of native heart without angina pectoris   2. Hyperlipidemia LDL goal <70   3. ICD (implantable cardioverter-defibrillator) in place   4. Essential hypertension   5. Prediabetes   6. H/O: CVA (cerebrovascular accident)    PLAN:    In order of problems listed above:  CAD s/p CABG: Recent cardiac catheterization performed on 09/13/2020 showed stable coronary anatomy.  Continue medical therapy.  Hyperlipidemia: On Lipitor  Ischemic cardiomyopathy s/p ICD: Followed by Dr. Rayann Heman  Hypertension: Blood pressure stable  Prediabetes: Managed by primary care provider  History of CVA: No recurrence.        Medication Adjustments/Labs and Tests Ordered: Current medicines are reviewed at length with the patient today.  Concerns regarding medicines are outlined above.  Orders Placed This Encounter  Procedures   Hepatic function panel   Lipid panel   No orders of the defined types were placed in this encounter.   Patient Instructions  Medication Instructions:  Your physician recommends that you continue on your current medications as directed. Please refer to the Current Medication list given to you today.  *If you need a refill on your cardiac medications before your next appointment, please call your pharmacy*  Lab Work: Your physician recommends that you return for lab work in 3 months:  Fasting Lipid  Panel-DO NOT EAT OR DRINK PAST MIDNIGHT. Hepatic (Liver) Function Test  If you have labs (blood work) drawn today and your tests are completely normal, you will receive your results only by: MyChart Message (if you have MyChart) OR A paper copy in the mail If you have any lab test that is abnormal or we need to change your treatment, we will call you to review the results.   Testing/Procedures: NONE ordered at this time of appointment    Follow-Up: At [facility], you and your health needs are our priority.  As part of our continuing mission to provide you with exceptional heart care, we have created designated Provider Care Teams.  These Care Teams include your primary Cardiologist (physician) and Advanced Practice Providers (APPs -  Physician Assistants and Nurse Practitioners) who all work together to provide you with the care you need, when you need it.  We recommend signing up for the patient portal called "MyChart".  Sign up information is provided on this After Visit Summary.  MyChart is used to connect with patients for Virtual Visits (Telemedicine).  Patients are able to view lab/test results, encounter notes, upcoming appointments, etc.  Non-urgent messages can be sent to your provider as well.   To learn more about what you can do with MyChart, go to [email or web address].    Your next appointment:   3-5 month(s)  The format for your next appointment:   In Person  Provider:   Peter Martinique, MD  Other Instructions  Hilbert Corrigan, [place]  10/07/2020 11:43 PM    [facility]

## 2020-10-06 DIAGNOSIS — M5416 Radiculopathy, lumbar region: Secondary | ICD-10-CM | POA: Diagnosis not present

## 2020-10-06 DIAGNOSIS — G894 Chronic pain syndrome: Secondary | ICD-10-CM | POA: Diagnosis not present

## 2020-10-06 DIAGNOSIS — M47816 Spondylosis without myelopathy or radiculopathy, lumbar region: Secondary | ICD-10-CM | POA: Diagnosis not present

## 2020-10-06 DIAGNOSIS — M5136 Other intervertebral disc degeneration, lumbar region: Secondary | ICD-10-CM | POA: Diagnosis not present

## 2020-10-06 DIAGNOSIS — R29898 Other symptoms and signs involving the musculoskeletal system: Secondary | ICD-10-CM | POA: Diagnosis not present

## 2020-10-06 DIAGNOSIS — M6281 Muscle weakness (generalized): Secondary | ICD-10-CM | POA: Diagnosis not present

## 2020-10-07 ENCOUNTER — Encounter: Payer: Self-pay | Admitting: Physician Assistant

## 2020-10-07 DIAGNOSIS — R6889 Other general symptoms and signs: Secondary | ICD-10-CM | POA: Diagnosis not present

## 2020-10-10 ENCOUNTER — Ambulatory Visit: Payer: Medicare HMO | Attending: Internal Medicine | Admitting: Internal Medicine

## 2020-10-10 ENCOUNTER — Ambulatory Visit (INDEPENDENT_AMBULATORY_CARE_PROVIDER_SITE_OTHER): Payer: Medicare HMO

## 2020-10-10 ENCOUNTER — Other Ambulatory Visit: Payer: Self-pay

## 2020-10-10 DIAGNOSIS — Z2821 Immunization not carried out because of patient refusal: Secondary | ICD-10-CM | POA: Diagnosis not present

## 2020-10-10 DIAGNOSIS — I5022 Chronic systolic (congestive) heart failure: Secondary | ICD-10-CM | POA: Diagnosis not present

## 2020-10-10 DIAGNOSIS — Z95810 Presence of automatic (implantable) cardiac defibrillator: Secondary | ICD-10-CM

## 2020-10-10 DIAGNOSIS — M6281 Muscle weakness (generalized): Secondary | ICD-10-CM | POA: Diagnosis not present

## 2020-10-10 DIAGNOSIS — Z23 Encounter for immunization: Secondary | ICD-10-CM

## 2020-10-10 DIAGNOSIS — M5136 Other intervertebral disc degeneration, lumbar region: Secondary | ICD-10-CM | POA: Diagnosis not present

## 2020-10-10 DIAGNOSIS — I255 Ischemic cardiomyopathy: Secondary | ICD-10-CM | POA: Diagnosis not present

## 2020-10-10 DIAGNOSIS — M47816 Spondylosis without myelopathy or radiculopathy, lumbar region: Secondary | ICD-10-CM | POA: Diagnosis not present

## 2020-10-10 DIAGNOSIS — Z87891 Personal history of nicotine dependence: Secondary | ICD-10-CM | POA: Diagnosis not present

## 2020-10-10 DIAGNOSIS — M5416 Radiculopathy, lumbar region: Secondary | ICD-10-CM | POA: Diagnosis not present

## 2020-10-10 DIAGNOSIS — D696 Thrombocytopenia, unspecified: Secondary | ICD-10-CM | POA: Diagnosis not present

## 2020-10-10 DIAGNOSIS — G894 Chronic pain syndrome: Secondary | ICD-10-CM | POA: Diagnosis not present

## 2020-10-10 DIAGNOSIS — R29898 Other symptoms and signs involving the musculoskeletal system: Secondary | ICD-10-CM | POA: Diagnosis not present

## 2020-10-10 NOTE — Progress Notes (Signed)
Patient ID: [name], male   DOB: 03-Sep-1960, 59 y.o.   MRN: [id number] Virtual Visit via Telephone Note  I connected with [name] on 10/10/2020 at 11:27 a.m by telephone and verified that I am speaking with the correct person using two identifiers  Location: Patient: home Provider: office  Participants: Myself Patient   I discussed the limitations, risks, security and privacy concerns of performing an evaluation and management service by telephone and the availability of in person appointments. I also discussed with the patient that there may be a patient responsible charge related to this service. The patient expressed understanding and agreed to proceed.   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  Last seen 07/2020.  Today's visit is for chronic disease management and hosp f/u.  Since last visit with me, patient was hospitalized the end of last month with unstable angina.  Cardiac catheterization was done and showed chronically occluded proximal to mid RCA, 40% proximal LAD, 50% ostial to proximal left circumflex lesion, no SVG to OM flow was visualized and was felt to be occluded.  Medical therapy recommended.  Farxiga added to his medication regimen.  Echo revealed EF of 25 to 30%.  Patient has ICD.  He followed up with the cardiology PA on 10/05/2020.  Today: Patient reports he is doing much better since he left the hospital.  He has not had any further chest pains.  He has not had to use sublingual nitroglycerin.  Reports compliance with taking his medications including Farxiga, Entresto, carvedilol, atorvastatin, aspirin, spironolactone and furosemide.  Shortness of breath only if he overexerts himself.  No lower extremity edema.  Denies palpitations.  He quit smoking completely about 2-1/2 weeks ago.  I note  that on CBC from hospitalization, his platelet count was mildly low between 141,000-129,000.  He denies any bruising or bleeding.  HM: Due for Prevnar 20 and shingles vaccine.  He declines the former but will get the shingles vaccine.  Outpatient Encounter Medications as of 10/10/2020  Medication Sig   ALPHAGAN P 0.1 % SOLN INSTILL 1 DROP INTO BOTH EYES TWICE A DAY (Patient taking differently: Place 1 drop into both eyes 2 (two) times daily.)   aspirin 81 MG EC tablet Take 1 tablet (81 mg total) by mouth daily. Restart on 07/05/2018 (Patient taking differently: Take 81 mg by mouth daily.)   atorvastatin (LIPITOR) 80 MG tablet TAKE 1 TABLET BY MOUTH EVERY DAY (Patient taking differently: Take 80 mg by mouth daily.)   carvedilol (COREG) 25 MG tablet TAKE 1 TABLET BY MOUTH TWICE A DAY WITH A MEAL (Patient taking differently: Take 25 mg by mouth 2 (two) times daily with a meal.)   Cholecalciferol (VITAMIN D) 125 MCG (5000 UT) CAPS Take 5,000 Units by mouth daily.   dapagliflozin propanediol (FARXIGA) 10 MG TABS tablet Take 1 tablet (10 mg total) by mouth daily.   ENTRESTO 97-103 MG TAKE 1 TABLET BY MOUTH TWICE A DAY (Patient taking differently: Take 1 tablet by mouth 2 (two) times daily.)   furosemide (LASIX) 20 MG tablet TAKE 1 TABLET BY MOUTH EVERY DAY (Patient taking differently: Take 20 mg by mouth daily.)   gabapentin (NEURONTIN) 300 MG capsule TAKE 1 CAPSULE BY MOUTH EVERYDAY AT BEDTIME (Patient taking differently: Take 300 mg by mouth at bedtime.)   latanoprost (XALATAN) 0.005 % ophthalmic  solution Place 1 drop into both eyes at bedtime.   methocarbamol (ROBAXIN) 500 MG tablet TAKE 1 TABLET (500 MG TOTAL) BY MOUTH 2 (TWO) TIMES DAILY AS NEEDED FOR MUSCLE SPASMS.   Multiple Vitamin (MULTIVITAMIN WITH MINERALS) TABS tablet Take 1 tablet by mouth daily. Centrum Silver   nicotine (NICODERM CQ - DOSED IN MG/24 HOURS) 21 mg/24hr patch Place 1 patch (21 mg total) onto the skin daily.   nitroGLYCERIN  (NITROSTAT) 0.4 MG SL tablet Place 1 tablet (0.4 mg total) under the tongue every 5 (five) minutes x 3 doses as needed for chest pain.   spironolactone (ALDACTONE) 25 MG tablet Take 1 tablet (25 mg total) by mouth daily.   traMADol (ULTRAM) 50 MG tablet Take 1 tablet (50 mg total) by mouth every 8 (eight) hours as needed. (Patient taking differently: Take 50 mg by mouth every 8 (eight) hours as needed for moderate pain.)   No facility-administered encounter medications on file as of 10/10/2020.      Observations/Objective: Lab Results  Component Value Date   WBC 4.9 09/14/2020   HGB 14.5 09/14/2020   HCT 41.6 09/14/2020   MCV 85.6 09/14/2020   PLT 130 (L) 09/14/2020     Chemistry      Component Value Date/Time   NA 141 09/10/2020 2216   NA 145 (H) 05/06/2020 1342   K 4.3 09/10/2020 2216   CL 105 09/10/2020 2216   CO2 26 09/10/2020 2216   BUN 14 09/10/2020 2216   BUN 10 05/06/2020 1342   CREATININE 1.18 09/10/2020 2216   CREATININE 0.90 11/03/2015 1609      Component Value Date/Time   CALCIUM 9.6 09/10/2020 2216   ALKPHOS 137 (H) 02/29/2020 1642   AST 14 02/29/2020 1642   ALT 15 02/29/2020 1642   BILITOT 0.3 02/29/2020 1642       Assessment and Plan: 1. Ischemic cardiomyopathy Stable on guideline recommended therapies. He will continue his current medications as recommended by cardiology.  Stressed the importance of low-salt diet.  2. Former smoker Commended him on quitting.  Encouraged him to remain tobacco free.  3. Thrombocytopenia ([place]) Patient will come to the lab sometime in the next 2 to 3 weeks to have CBC rechecked.  4. Need for shingles vaccine Patient agreeable to receiving Shingrix.  I will have him schedule to see the clinical pharmacist in 1 to 3 weeks to get his first shot and we will plan to give the second shot on subsequent visit with me.  5. Pneumococcal vaccination declined Recommended.  Patient declined.   Follow Up Instructions: 4 mths    I discussed the assessment and treatment plan with the patient. The patient was provided an opportunity to ask questions and all were answered. The patient agreed with the plan and demonstrated an understanding of the instructions.   The patient was advised to call back or seek an in-person evaluation if the symptoms worsen or if the condition fails to improve as anticipated.  I  Spent 10 minutes on this telephone encounter  Karle Plumber, MD

## 2020-10-11 ENCOUNTER — Telehealth: Payer: Self-pay

## 2020-10-11 NOTE — Progress Notes (Signed)
EPIC Encounter for ICM Monitoring  Patient Name: [name] is a 60 y.o. male Date: 10/11/2020 Primary Care Physican: Ladell Pier, MD Primary Cardiologist: [place] Electrophysiologist: Allred 10/05/2020 Office Weight: 261 lbs        1st ICM Remote Transmission.  Attempted call to patient and unable to reach.   Transmission reviewed.    CorVue thoracic impedance normal but was suggesting possible fluid accumulation from 09/29/20 - 10/05/20.   Prescribed:  Furosemide 20 mg take 1 tablet by mouth daily. Spironolactone 25 mg take 1 tablet daily  Recommendations: Unable to reach.    Follow-up plan: ICM clinic phone appointment on 11/14/2020.   91 day device clinic remote transmission 11/23/2020.    EP/Cardiology Office Visits: 01/31/2021 with Dr. [place].    Copy of ICM check sent to Dr. Rayann Heman.   3 month ICM trend: 10/10/2020.    1 Year ICM trend:       Rosalene Billings, RN 10/11/2020 1:00 PM

## 2020-10-11 NOTE — Telephone Encounter (Signed)
Remote ICM transmission received.  Attempted call to patient regarding ICM remote transmission and call would not go through per recording.

## 2020-10-12 DIAGNOSIS — M5416 Radiculopathy, lumbar region: Secondary | ICD-10-CM | POA: Diagnosis not present

## 2020-10-12 DIAGNOSIS — M47816 Spondylosis without myelopathy or radiculopathy, lumbar region: Secondary | ICD-10-CM | POA: Diagnosis not present

## 2020-10-12 DIAGNOSIS — R29898 Other symptoms and signs involving the musculoskeletal system: Secondary | ICD-10-CM | POA: Diagnosis not present

## 2020-10-12 DIAGNOSIS — G894 Chronic pain syndrome: Secondary | ICD-10-CM | POA: Diagnosis not present

## 2020-10-12 DIAGNOSIS — M5136 Other intervertebral disc degeneration, lumbar region: Secondary | ICD-10-CM | POA: Diagnosis not present

## 2020-10-12 DIAGNOSIS — M6281 Muscle weakness (generalized): Secondary | ICD-10-CM | POA: Diagnosis not present

## 2020-10-13 ENCOUNTER — Telehealth: Payer: Self-pay

## 2020-10-13 NOTE — Telephone Encounter (Signed)
The patient was returning [place] phone call. I let him know that she is off and I will have her call back on Friday. He insisted on know what was on the transmission. I let him speak with Leigh, rn.

## 2020-10-13 NOTE — Telephone Encounter (Signed)
Patient returning phone call. Advised patient his Optivol appears normal. Patient denies any symptoms or complaints. Advised I would forward to [place], [facility] RN to let her know we followed up and she will call if any changes are warranted. Patient agreeable and appreciative of call.

## 2020-10-14 ENCOUNTER — Telehealth: Payer: Self-pay | Admitting: Cardiology

## 2020-10-14 ENCOUNTER — Other Ambulatory Visit: Payer: Self-pay

## 2020-10-14 MED ORDER — DAPAGLIFLOZIN PROPANEDIOL 10 MG PO TABS: 10.0000 mg | ORAL_TABLET | Freq: Every day | ORAL | 1 refills | Status: DC

## 2020-10-14 MED ORDER — DAPAGLIFLOZIN PROPANEDIOL 10 MG PO TABS: 10.0000 mg | ORAL_TABLET | Freq: Every day | ORAL | 3 refills | Status: DC

## 2020-10-14 NOTE — Telephone Encounter (Signed)
*  STAT* If patient is at the pharmacy, call can be transferred to refill team.   1. Which medications need to be refilled? (please list name of each medication and dose if known) dapagliflozin propanediol (FARXIGA) 10 MG TABS tablet  2. Which pharmacy/location (including street and city if local pharmacy) is medication to be sent to? [id number] - [place], [place] - [address] AT [place]  3. Do they need a 30 day or 90 day supply? [place]

## 2020-10-16 ENCOUNTER — Other Ambulatory Visit: Payer: Self-pay | Admitting: Internal Medicine

## 2020-10-16 DIAGNOSIS — M5416 Radiculopathy, lumbar region: Secondary | ICD-10-CM

## 2020-10-16 NOTE — Telephone Encounter (Signed)
Requested medication (s) are due for refill today: yes  Requested medication (s) are on the active medication list: yes  Last refill:  09/07/20 #30  Future visit scheduled: no  Notes to clinic:  med not delegated to NT to RF   Requested Prescriptions  Pending Prescriptions Disp Refills   methocarbamol (ROBAXIN) 500 MG tablet [Pharmacy Med Name: METHOCARBAMOL 500 MG TABLET] 30 tablet 0    Sig: TAKE 1 TABLET (500 MG TOTAL) BY MOUTH 2 (TWO) TIMES DAILY AS NEEDED FOR MUSCLE SPASMS.      Not Delegated - Analgesics:  Muscle Relaxants Failed - 10/16/2020  5:04 PM      Failed - This refill cannot be delegated      Passed - Valid encounter within last 6 months    Recent Outpatient Visits           6 days ago Ischemic cardiomyopathy   [place], MD   2 months ago Lumbar radiculopathy   [place], MD   7 months ago Essential hypertension   [place], MD   8 months ago Encounter for [employer] annual wellness exam   [place], [place], NP   12 months ago Essential hypertension   [place], Amy J, NP       Future Appointments             In 1 week Daisy Blossom, Jarome Matin, [place]   In 3 months [place], Ander Slade, MD [facility] [place], [facility]   In 3 months Ladell Pier, MD [place]

## 2020-10-18 DIAGNOSIS — R29898 Other symptoms and signs involving the musculoskeletal system: Secondary | ICD-10-CM | POA: Diagnosis not present

## 2020-10-18 DIAGNOSIS — M25551 Pain in right hip: Secondary | ICD-10-CM | POA: Diagnosis not present

## 2020-10-18 DIAGNOSIS — M25571 Pain in right ankle and joints of right foot: Secondary | ICD-10-CM | POA: Diagnosis not present

## 2020-10-18 DIAGNOSIS — M5136 Other intervertebral disc degeneration, lumbar region: Secondary | ICD-10-CM | POA: Diagnosis not present

## 2020-10-20 DIAGNOSIS — M5136 Other intervertebral disc degeneration, lumbar region: Secondary | ICD-10-CM | POA: Diagnosis not present

## 2020-10-24 ENCOUNTER — Ambulatory Visit: Payer: Medicare HMO | Admitting: Pharmacist

## 2020-10-26 ENCOUNTER — Other Ambulatory Visit: Payer: Medicare HMO

## 2020-10-26 ENCOUNTER — Ambulatory Visit: Payer: Medicare HMO | Admitting: Pharmacist

## 2020-10-26 DIAGNOSIS — M5136 Other intervertebral disc degeneration, lumbar region: Secondary | ICD-10-CM | POA: Diagnosis not present

## 2020-10-28 ENCOUNTER — Other Ambulatory Visit: Payer: Self-pay

## 2020-10-28 ENCOUNTER — Ambulatory Visit: Payer: Medicare HMO

## 2020-10-28 ENCOUNTER — Ambulatory Visit: Payer: Medicare HMO | Attending: Internal Medicine | Admitting: Pharmacist

## 2020-10-28 DIAGNOSIS — Z23 Encounter for immunization: Secondary | ICD-10-CM | POA: Diagnosis not present

## 2020-10-28 DIAGNOSIS — D696 Thrombocytopenia, unspecified: Secondary | ICD-10-CM

## 2020-10-28 DIAGNOSIS — E785 Hyperlipidemia, unspecified: Secondary | ICD-10-CM

## 2020-10-28 NOTE — Progress Notes (Signed)
Patient presents for vaccination against zoster per orders of Dr. Johnson. Consent given. Counseling provided. No contraindications exists. Vaccine administered without incident.   Luke Van Ausdall, PharmD, BCACP, CPP Clinical Pharmacist [facility] [phone]  

## 2020-10-29 LAB — CBC
Hematocrit: 45.2 % (ref 37.5–51.0)
Hemoglobin: 15.5 g/dL (ref 13.0–17.7)
MCH: 29.6 pg (ref 26.6–33.0)
MCHC: 34.3 g/dL (ref 31.5–35.7)
MCV: 86 fL (ref 79–97)
Platelets: 184 10*3/uL (ref 150–450)
RBC: 5.23 x10E6/uL (ref 4.14–5.80)
RDW: 13.5 % (ref 11.6–15.4)
WBC: 5.5 10*3/uL (ref 3.4–10.8)

## 2020-11-01 DIAGNOSIS — I69354 Hemiplegia and hemiparesis following cerebral infarction affecting left non-dominant side: Secondary | ICD-10-CM | POA: Diagnosis not present

## 2020-11-01 DIAGNOSIS — R2689 Other abnormalities of gait and mobility: Secondary | ICD-10-CM | POA: Diagnosis not present

## 2020-11-01 DIAGNOSIS — Z951 Presence of aortocoronary bypass graft: Secondary | ICD-10-CM | POA: Diagnosis not present

## 2020-11-01 DIAGNOSIS — G479 Sleep disorder, unspecified: Secondary | ICD-10-CM | POA: Diagnosis not present

## 2020-11-01 DIAGNOSIS — M5136 Other intervertebral disc degeneration, lumbar region: Secondary | ICD-10-CM | POA: Diagnosis not present

## 2020-11-01 DIAGNOSIS — R200 Anesthesia of skin: Secondary | ICD-10-CM | POA: Diagnosis not present

## 2020-11-01 DIAGNOSIS — M6281 Muscle weakness (generalized): Secondary | ICD-10-CM | POA: Diagnosis not present

## 2020-11-01 DIAGNOSIS — I69398 Other sequelae of cerebral infarction: Secondary | ICD-10-CM | POA: Diagnosis not present

## 2020-11-02 DIAGNOSIS — M79604 Pain in right leg: Secondary | ICD-10-CM | POA: Diagnosis not present

## 2020-11-03 DIAGNOSIS — R200 Anesthesia of skin: Secondary | ICD-10-CM | POA: Diagnosis not present

## 2020-11-03 DIAGNOSIS — R202 Paresthesia of skin: Secondary | ICD-10-CM | POA: Diagnosis not present

## 2020-11-14 ENCOUNTER — Ambulatory Visit (INDEPENDENT_AMBULATORY_CARE_PROVIDER_SITE_OTHER): Payer: Medicare HMO

## 2020-11-14 DIAGNOSIS — I5022 Chronic systolic (congestive) heart failure: Secondary | ICD-10-CM | POA: Diagnosis not present

## 2020-11-14 DIAGNOSIS — Z95810 Presence of automatic (implantable) cardiac defibrillator: Secondary | ICD-10-CM | POA: Diagnosis not present

## 2020-11-16 NOTE — Progress Notes (Signed)
EPIC Encounter for ICM Monitoring  Patient Name: [name] is a 60 y.o. male Date: 11/16/2020 Primary Care Physican: Ladell Pier, MD Primary Cardiologist: [place] Electrophysiologist: Allred 11/16/2020 Weight: 263 lbs                                                            Spoke with patient. He is doing well.  Discussed diet and fluid intake.  He likes to eat Chinese take out and advised to limit to once a month if possible due to the high sodium content.   CorVue thoracic impedance normal..   Prescribed: Furosemide 20 mg take 1 tablet by mouth daily. Spironolactone 25 mg take 1 tablet daily   Recommendations: Recommendation to limit salt intake to 2000 mg daily and fluid intake to 64 oz daily.  Encouraged to call if experiencing any fluid symptoms.    Follow-up plan: ICM clinic phone appointment on 12/20/2020.   91 day device clinic remote transmission 11/23/2020.     EP/Cardiology Office Visits: 01/31/2021 with Dr. [place].  08/28/2021 with Dr Rayann Heman.   Copy of ICM check sent to Dr. Rayann Heman.   3 month ICM trend: 11/14/2020.    1 Year ICM trend:       Rosalene Billings, RN 11/16/2020 11:46 AM

## 2020-11-23 ENCOUNTER — Ambulatory Visit (INDEPENDENT_AMBULATORY_CARE_PROVIDER_SITE_OTHER): Payer: Medicare HMO

## 2020-11-23 DIAGNOSIS — I255 Ischemic cardiomyopathy: Secondary | ICD-10-CM

## 2020-11-24 LAB — CUP PACEART REMOTE DEVICE CHECK
Battery Remaining Longevity: 117 mo
Battery Remaining Percentage: 94 %
Battery Voltage: 3.02 V
Brady Statistic RV Percent Paced: 0 %
Date Time Interrogation Session: 20220810020151
HighPow Impedance: 66 Ohm
Implantable Lead Implant Date: 20220209
Implantable Lead Location: 753860
Implantable Pulse Generator Implant Date: 20220209
Lead Channel Impedance Value: 430 Ohm
Lead Channel Pacing Threshold Amplitude: 2.25 V
Lead Channel Pacing Threshold Pulse Width: 0.5 ms
Lead Channel Sensing Intrinsic Amplitude: 5.7 mV
Lead Channel Setting Pacing Amplitude: 2.5 V
Lead Channel Setting Pacing Pulse Width: 0.5 ms
Lead Channel Setting Sensing Sensitivity: 0.5 mV
Pulse Gen Serial Number: 111033765

## 2020-11-30 ENCOUNTER — Emergency Department (HOSPITAL_COMMUNITY): Payer: Medicare HMO

## 2020-11-30 ENCOUNTER — Encounter (HOSPITAL_COMMUNITY): Payer: Self-pay

## 2020-11-30 ENCOUNTER — Emergency Department (HOSPITAL_COMMUNITY)
Admission: EM | Admit: 2020-11-30 | Discharge: 2020-11-30 | Disposition: A | Discharge status: Home / Self Care | Payer: Medicare HMO | Attending: Emergency Medicine | Admitting: Emergency Medicine

## 2020-11-30 DIAGNOSIS — R110 Nausea: Secondary | ICD-10-CM | POA: Insufficient documentation

## 2020-11-30 DIAGNOSIS — R42 Dizziness and giddiness: Secondary | ICD-10-CM

## 2020-11-30 DIAGNOSIS — Z955 Presence of coronary angioplasty implant and graft: Secondary | ICD-10-CM | POA: Diagnosis not present

## 2020-11-30 DIAGNOSIS — F17210 Nicotine dependence, cigarettes, uncomplicated: Secondary | ICD-10-CM | POA: Insufficient documentation

## 2020-11-30 DIAGNOSIS — I2510 Atherosclerotic heart disease of native coronary artery without angina pectoris: Secondary | ICD-10-CM | POA: Insufficient documentation

## 2020-11-30 DIAGNOSIS — R001 Bradycardia, unspecified: Secondary | ICD-10-CM | POA: Insufficient documentation

## 2020-11-30 DIAGNOSIS — R531 Weakness: Secondary | ICD-10-CM | POA: Insufficient documentation

## 2020-11-30 DIAGNOSIS — R079 Chest pain, unspecified: Secondary | ICD-10-CM | POA: Insufficient documentation

## 2020-11-30 DIAGNOSIS — R5383 Other fatigue: Secondary | ICD-10-CM | POA: Diagnosis not present

## 2020-11-30 DIAGNOSIS — I5022 Chronic systolic (congestive) heart failure: Secondary | ICD-10-CM

## 2020-11-30 DIAGNOSIS — I110 Hypertensive heart disease with heart failure: Secondary | ICD-10-CM | POA: Diagnosis not present

## 2020-11-30 DIAGNOSIS — Z7982 Long term (current) use of aspirin: Secondary | ICD-10-CM | POA: Insufficient documentation

## 2020-11-30 DIAGNOSIS — R0789 Other chest pain: Secondary | ICD-10-CM | POA: Diagnosis not present

## 2020-11-30 DIAGNOSIS — R0602 Shortness of breath: Secondary | ICD-10-CM | POA: Insufficient documentation

## 2020-11-30 DIAGNOSIS — Z951 Presence of aortocoronary bypass graft: Secondary | ICD-10-CM | POA: Insufficient documentation

## 2020-11-30 DIAGNOSIS — I25118 Atherosclerotic heart disease of native coronary artery with other forms of angina pectoris: Secondary | ICD-10-CM | POA: Diagnosis not present

## 2020-11-30 DIAGNOSIS — Z79899 Other long term (current) drug therapy: Secondary | ICD-10-CM | POA: Diagnosis not present

## 2020-11-30 LAB — TROPONIN I (HIGH SENSITIVITY)
Troponin I (High Sensitivity): 5 ng/L (ref <18)
Troponin I (High Sensitivity): 4 ng/L (ref <18)

## 2020-11-30 LAB — CBC WITH DIFFERENTIAL/PLATELET
Abs Immature Granulocytes: 0.01 10*3/uL (ref 0.00–0.07)
Basophils Absolute: 0 10*3/uL (ref 0.0–0.1)
Basophils Relative: 1 %
Eosinophils Absolute: 0.1 10*3/uL (ref 0.0–0.5)
Eosinophils Relative: 2 %
HCT: 46.4 % (ref 39.0–52.0)
Hemoglobin: 15.7 g/dL (ref 13.0–17.0)
Immature Granulocytes: 0 %
Lymphocytes Relative: 44 %
Lymphs Abs: 2.1 10*3/uL (ref 0.7–4.0)
MCH: 29.9 pg (ref 26.0–34.0)
MCHC: 33.8 g/dL (ref 30.0–36.0)
MCV: 88.4 fL (ref 80.0–100.0)
Monocytes Absolute: 0.6 10*3/uL (ref 0.1–1.0)
Monocytes Relative: 12 %
Neutro Abs: 2 10*3/uL (ref 1.7–7.7)
Neutrophils Relative %: 41 %
Platelets: 164 10*3/uL (ref 150–400)
RBC: 5.25 MIL/uL (ref 4.22–5.81)
RDW: 13 % (ref 11.5–15.5)
WBC: 4.8 10*3/uL (ref 4.0–10.5)
nRBC: 0 % (ref 0.0–0.2)

## 2020-11-30 LAB — BASIC METABOLIC PANEL
Anion gap: 8 (ref 5–15)
BUN: 14 mg/dL (ref 6–20)
CO2: 21 mmol/L — ABNORMAL LOW (ref 22–32)
Calcium: 9.3 mg/dL (ref 8.9–10.3)
Chloride: 109 mmol/L (ref 98–111)
Creatinine, Ser: 1.07 mg/dL (ref 0.61–1.24)
GFR, Estimated: >60 mL/min (ref >60)
Glucose, Bld: 116 mg/dL — ABNORMAL HIGH (ref 70–99)
Potassium: 4 mmol/L (ref 3.5–5.1)
Sodium: 138 mmol/L (ref 135–145)

## 2020-11-30 LAB — BRAIN NATRIURETIC PEPTIDE: B Natriuretic Peptide: 144.6 pg/mL — ABNORMAL HIGH (ref 0.0–100.0)

## 2020-11-30 NOTE — ED Provider Notes (Signed)
Patient was seen in conjunction with [place] PA-C. Pt with extensive cardiac history. Mild CP 2 days ago -- resolved. Yesterday and today has been lightheaded and nauseous. BP's in [id number] systolic.   Initial cardiac work-up reassuring including troponin. Discussed with Dr. Zenia Resides. Will request cardiology consult.   6:37 PM cardiology has consulted on patient and has cleared patient for discharge from a cardiac standpoint.  They would like patient to maintain good hydration.  They may consider adjusting medications if his blood pressures continue to be low, but do not want to do this tonight.  Plan for discharge.  BP 101/76   Pulse (!) 49   Temp 98 F (36.7 C) (Oral)   Resp 16   Ht '6\' 1"'$  (1.854 m)   Wt 120.2 kg   SpO2 98%   BMI 34.96 kg/m     Carlisle Cater, Hershal Coria 11/30/20 Rhina Brackett, MD 12/01/20 1350

## 2020-11-30 NOTE — ED Provider Notes (Signed)
[facility] EMERGENCY DEPARTMENT Provider Note   CSN: [id number] Arrival date & time: 11/30/20  1352     History Chief Complaint  Patient presents with   Chest Pain   Dizziness   [name] is a 60 y.o. male with a significant PMH of CAD s/p stent (2015, 2022) and CABG, Dressler Syndrome, Cardiomyopathy, HLD, HTN, HFrEF (Last EF 25-30% in May) s/p ICD presents to the ED with a chief complaint of chest pain.  He states that the chest pain started two days ago while playing outside with his grandkids. He described it as "uncomfortable" but it went away with rest. His chest pain has not returned since Monday, but they have all been relieved with rest. He also endorses having associated nausea, lightheadedness, generalized weakness and shortness of breath that is associated with the chest pain. He states that the shortness of breath has resolved, but the nausea, lightheadedness, and generalized weakness have been constant since Monday.    Chest Pain Associated symptoms: dizziness, fatigue, nausea, shortness of breath and weakness   Associated symptoms: no abdominal pain, no back pain, no cough, no diaphoresis, no fever, no headache, no palpitations and no vomiting   Associated symptoms comment:  Lightheadedness Dizziness Associated symptoms: chest pain, nausea, shortness of breath and weakness   Associated symptoms: no diarrhea, no headaches, no palpitations and no vomiting      Past Medical History:  Diagnosis Date   Abnormal EKG    hx of ischemia showing up on ekg's   Acute myopericarditis    a. 03/2013: readm with hypoxia, tachycardia, elevated ESR/CRP, elevated troponin with Dressler syndrome and myopericarditis; treated with steroids.   Acute respiratory failure ([place])    a. 01/2013: VDRF. b. 03/2013: hypoxia requiring supp O2 during adm, resolved by discharge.   C. difficile colitis    a. 01/2013 during prolonged adm. b. Recurred 03/2013.   Cardiac  tamponade    a. 01/2013 s/p drain.   Cataracts, bilateral    Coronary artery disease    a. s/p MI in 2009 in [place] with stenting of the LCX and LAD;  b. 12/2012 NSTEMI/CAD: LM nl, LAD patent prox stent, LCX 50-70 isr (FFR 0.84), RCA dom, 129m EF 40-45%-->Med Rx. c. 01/2013: anterolateral STEMI complicated by pericardial effusion (presumed purulent pericarditis) and tamponade s/p drain, ruptured LV pseudoaneurysm s/p CorMatrix patch, CABGx1 (SVG-OM1), fever, CVA, VDRF, C diff.   CVA (cerebral infarction)    a. 01/2013 in setting of prolonged hospitalization - residual L arm weakness.   Dressler syndrome ([place]    a. 03/2013: readm with hypoxia, tachycardia, elevated ESR/CRP, elevated troponin with Dressler syndrome and myopericarditis; treated with steroids.   Glaucoma    Hyperlipidemia    Hypokalemia    Hyponatremia    a. During late 2014.   Ischemic cardiomyopathy    a. Sept/Oct 2014: EF ~40% (ICM). b. 03/2013: EF 25-30%. Off ACEI due to hypotension (MIXED NICM/ICM).   Optic neuropathy, ischemic    Pericardial effusion    a. 01/2013: pericardial effusion (presumed purulent pericarditis) and cardiac tamponade s/p drain. b. Persistent moderate pericardial effusion 03/2013.   Pre-diabetes    Pseudoaneurysm of left ventricle of heart    a. 01/2013:  Ruptured inferoposterior LV pseudoaneurysm s/p CorMatrix patch.   Sinus bradycardia    asymptomatic   Stroke ([facility] 2009   weak lt side-lt arm   Tobacco abuse    Wears dentures    top    Patient Active Problem  List   Diagnosis Date Noted   Unstable angina (HCC)    Hx of CABG 07/09/2019   Lumbar radiculopathy 12/04/2018   Glaucoma 12/04/2018   Cortical age-related cataract of both eyes 12/04/2018   Pain management contract agreement 12/04/2018   Lumbar herniated disc 06/04/2018   Prediabetes 01/01/2018   Hyperlipidemia 12/30/2017   Obesity (BMI 30.0-34.9) 12/30/2017   Benign neoplasm of descending colon    Essential hypertension  11/03/2015   Cardiomyopathy, ischemic 37/16/9678   Chronic systolic dysfunction of left ventricle 06/03/2015   Erectile dysfunction due to arterial insufficiency 12/23/2014   Lipoma of skin and subcutaneous tissue of neck 05/24/2014   History of cardioembolic stroke 93/81/0175   Vision loss of right eye 06/23/2013   Dyslipidemia 06/22/2013   Dressler syndrome [facility])    Coronary artery disease    Cardiomyopathy ([place])    Pseudoaneurysm of left ventricle of heart    Tobacco abuse     Past Surgical History:  Procedure Laterality Date   CARDIAC CATHETERIZATION  01/06/2013   COLONOSCOPY WITH PROPOFOL N/A 07/03/2016   Procedure: COLONOSCOPY WITH PROPOFOL;  Surgeon: Manus Gunning, MD;  Location: [facility];  Service: Gastroenterology;  Laterality: N/A;   CORONARY ANGIOPLASTY WITH STENT PLACEMENT  2000's X 2   "1 + 1" (01/06/2013)   CORONARY ARTERY BYPASS GRAFT N/A 01/28/2013   Procedure: CORONARY ARTERY BYPASS GRAFTING (CABG);  Surgeon: Melrose Nakayama, MD;  Location: [place];  Service: Open Heart Surgery;  Laterality: N/A;  CABG x one, using left greater saphenous vein harvested endoscopically   ICD IMPLANT N/A 05/25/2020   Procedure: ICD IMPLANT;  Surgeon: Thompson Grayer, MD;  Location: [place] CV LAB;  Service: Cardiovascular;  Laterality: N/A;   INTRAOPERATIVE TRANSESOPHAGEAL ECHOCARDIOGRAM N/A 01/28/2013   Procedure: INTRAOPERATIVE TRANSESOPHAGEAL ECHOCARDIOGRAM;  Surgeon: Melrose Nakayama, MD;  Location: [place];  Service: Open Heart Surgery;  Laterality: N/A;   LEFT HEART CATH AND CORS/GRAFTS ANGIOGRAPHY N/A 09/13/2020   Procedure: LEFT HEART CATH AND CORS/GRAFTS ANGIOGRAPHY;  Surgeon: Martinique, Peter M, MD;  Location: [place] CV LAB;  Service: Cardiovascular;  Laterality: N/A;   LEFT HEART CATHETERIZATION WITH CORONARY ANGIOGRAM N/A 01/06/2013   Procedure: LEFT HEART CATHETERIZATION WITH CORONARY ANGIOGRAM;  Surgeon: Peter M Martinique, MD;  Location: [facility] CATH LAB;  Service:  Cardiovascular;  Laterality: N/A;   LEFT HEART CATHETERIZATION WITH CORONARY ANGIOGRAM N/A 01/21/2013   Procedure: LEFT HEART CATHETERIZATION WITH CORONARY ANGIOGRAM;  Surgeon: Burnell Blanks, MD;  Location: [facility] CATH LAB;  Service: Cardiovascular;  Laterality: N/A;   LUMBAR LAMINECTOMY/ DECOMPRESSION WITH MET-RX Right 06/30/2018   Procedure: Right minimally invasive Lumbar Three-Four Far lateral discectomy;  Surgeon: Judith Part, MD;  Location: [place];  Service: Neurosurgery;  Laterality: Right;  Right minimally invasive Lumbar Three-Four Far lateral discectomy   MASS EXCISION Right 07/21/2014   Procedure: EXCISION RIGHT NECK MASS;  Surgeon: Erroll Luna, MD;  Location: [place];  Service: General;  Laterality: Right;   PERICARDIAL TAP N/A 01/24/2013   Procedure: PERICARDIAL TAP;  Surgeon: Blane Ohara, MD;  Location: [facility] CATH LAB;  Service: Cardiovascular;  Laterality: N/A;   RIGHT HEART CATHETERIZATION Right 01/24/2013   Procedure: RIGHT HEART CATH;  Surgeon: Blane Ohara, MD;  Location: [facility] CATH LAB;  Service: Cardiovascular;  Laterality: Right;   VENTRICULAR ANEURYSM RESECTION N/A 01/28/2013   Procedure: LEFT VENTRICULAR ANEURYSM REPAIR;  Surgeon: Melrose Nakayama, MD;  Location: [place];  Service: Open Heart Surgery;  Laterality: N/A;       Family History  Problem Relation Age of Onset   Heart attack Mother    Diabetes Mother    Hypertension Mother    Hypertension Father    CAD Other    HIV Brother    Stroke Neg Hx     Social History   Tobacco Use   Smoking status: Every Day    Packs/day: 1.00    Years: 35.00    Pack years: 35.00    Types: Cigarettes   Smokeless tobacco: Never  Vaping Use   Vaping Use: Never used  Substance Use Topics   Alcohol use: No    Alcohol/week: 0.0 standard drinks   Drug use: No    Home Medications Prior to Admission medications   Medication Sig Start Date End Date Taking? Authorizing Provider  ALPHAGAN  P 0.1 % SOLN INSTILL 1 DROP INTO BOTH EYES TWICE A DAY Patient taking differently: Place 1 drop into both eyes 2 (two) times daily. 10/26/19  Yes Ladell Pier, MD  aspirin 81 MG EC tablet Take 1 tablet (81 mg total) by mouth daily. Restart on 07/05/2018 Patient taking differently: Take 81 mg by mouth daily. 06/30/18  Yes Judith Part, MD  atorvastatin (LIPITOR) 80 MG tablet TAKE 1 TABLET BY MOUTH EVERY DAY Patient taking differently: Take 80 mg by mouth daily. 04/21/20  Yes Ladell Pier, MD  carvedilol (COREG) 25 MG tablet TAKE 1 TABLET BY MOUTH TWICE A DAY WITH A MEAL Patient taking differently: Take 25 mg by mouth 2 (two) times daily with a meal. 06/20/20  Yes Martinique, Peter M, MD  Cholecalciferol (VITAMIN D) 125 MCG (5000 UT) CAPS Take 5,000 Units by mouth daily.   Yes [provider]  dapagliflozin propanediol (FARXIGA) 10 MG TABS tablet Take 1 tablet (10 mg total) by mouth daily. 10/14/20  Yes Martinique, Peter M, MD  ENTRESTO 97-103 MG TAKE 1 TABLET BY MOUTH TWICE A DAY Patient taking differently: Take 1 tablet by mouth 2 (two) times daily. 05/05/20  Yes Martinique, Peter M, MD  furosemide (LASIX) 20 MG tablet TAKE 1 TABLET BY MOUTH EVERY DAY Patient taking differently: Take 20 mg by mouth daily. 03/23/20  Yes Ladell Pier, MD  gabapentin (NEURONTIN) 300 MG capsule TAKE 1 CAPSULE BY MOUTH EVERYDAY AT BEDTIME Patient taking differently: Take 300 mg by mouth daily. 05/07/20  Yes Ladell Pier, MD  latanoprost (XALATAN) 0.005 % ophthalmic solution Place 1 drop into both eyes at bedtime. 05/31/16  Yes [provider]  methocarbamol (ROBAXIN) 500 MG tablet TAKE 1 TABLET (500 MG TOTAL) BY MOUTH 2 (TWO) TIMES DAILY AS NEEDED FOR MUSCLE SPASMS. 10/18/20  Yes Ladell Pier, MD  Multiple Vitamin (MULTIVITAMIN WITH MINERALS) TABS tablet Take 1 tablet by mouth daily. Centrum Silver   Yes [provider]  nitroGLYCERIN (NITROSTAT) 0.4 MG SL tablet Place 1 tablet (0.4  mg total) under the tongue every 5 (five) minutes x 3 doses as needed for chest pain. 09/14/20  Yes Reino Bellis B, NP  spironolactone (ALDACTONE) 25 MG tablet Take 1 tablet (25 mg total) by mouth daily. 04/22/20 11/30/20 Yes Ladell Pier, MD  traMADol (ULTRAM) 50 MG tablet Take 1 tablet (50 mg total) by mouth every 8 (eight) hours as needed. Patient taking differently: Take 50 mg by mouth every 8 (eight) hours as needed for moderate pain. 07/21/20  Yes Ladell Pier, MD  nicotine (NICODERM CQ - DOSED IN MG/24  HOURS) 21 mg/24hr patch Place 1 patch (21 mg total) onto the skin daily. Patient not taking: Reported on 11/30/2020 07/21/20   Ladell Pier, MD    Allergies    Patient has no known allergies.  Review of Systems   Review of Systems  Constitutional:  Positive for fatigue. Negative for chills, diaphoresis and fever.  HENT: Negative.    Eyes:  Negative for visual disturbance.  Respiratory:  Positive for chest tightness and shortness of breath. Negative for cough and wheezing.   Cardiovascular:  Positive for chest pain. Negative for palpitations and leg swelling.  Gastrointestinal:  Positive for nausea. Negative for abdominal distention, abdominal pain, diarrhea and vomiting.  Endocrine: Negative.   Genitourinary: Negative.   Musculoskeletal:  Negative for back pain.  Skin: Negative.   Neurological:  Positive for dizziness, weakness and light-headedness. Negative for headaches.  Psychiatric/Behavioral: Negative.     Physical Exam Updated Vital Signs BP 130/81   Pulse (!) 54   Temp 98.3 F (36.8 C) (Oral)   Resp (!) 24   Ht 6' 1" (1.854 m)   Wt 120.2 kg   SpO2 97%   BMI 34.96 kg/m   Physical Exam Vitals reviewed.  Constitutional:      General: He is not in acute distress.    Appearance: He is obese.  HENT:     Head: Normocephalic and atraumatic.  Eyes:     Pupils: Pupils are equal, round, and reactive to light.  Cardiovascular:     Rate and Rhythm: Regular  rhythm. Bradycardia present.     Pulses:          Radial pulses are 2+ on the right side and 2+ on the left side.       Dorsalis pedis pulses are 2+ on the right side and 2+ on the left side.     Heart sounds: Normal heart sounds, S1 normal and S2 normal. Heart sounds not distant. No murmur heard.   No friction rub. No gallop. No S3 or S4 sounds.  Pulmonary:     Effort: Pulmonary effort is normal. No respiratory distress.     Breath sounds: Normal breath sounds. No wheezing, rhonchi or rales.  Abdominal:     Palpations: Abdomen is soft.  Musculoskeletal:     Right lower leg: No edema.     Left lower leg: No edema.  Skin:    General: Skin is warm and dry.  Neurological:     General: No focal deficit present.     Mental Status: He is alert and oriented to person, place, and time.  Psychiatric:        Mood and Affect: Mood normal.        Behavior: Behavior normal.    ED Results / Procedures / Treatments   Labs (all labs ordered are listed, but only abnormal results are displayed) Labs Reviewed  BASIC METABOLIC PANEL - Abnormal; Notable for the following components:      Result Value   CO2 21 (*)    Glucose, Bld 116 (*)    All other components within normal limits  BRAIN NATRIURETIC PEPTIDE - Abnormal; Notable for the following components:   B Natriuretic Peptide 144.6 (*)    All other components within normal limits  CBC WITH DIFFERENTIAL/PLATELET  TROPONIN I (HIGH SENSITIVITY)  TROPONIN I (HIGH SENSITIVITY)    EKG EKG Interpretation  Date/Time:  Wednesday November 30 2020 14:23:28 EDT Ventricular Rate:  61 PR Interval:  242 QRS Duration: 90  QT Interval:  440 QTC Calculation: 442 R Axis:   -36 Text Interpretation: Sinus rhythm with 1st degree A-V block Left axis deviation Inferior infarct , age undetermined T wave abnormality, consider lateral ischemia No significant change since last tracing Confirmed by Blanchie Dessert [phone]) on 11/30/2020 2:28:54 PM  Radiology DG  Chest 2 View  Result Date: 11/30/2020 CLINICAL DATA:  Chest pain, dizziness EXAM: CHEST - 2 VIEW COMPARISON:  09/10/2020 FINDINGS: Left chest cardiac device with single lead. Unchanged cardiac and mediastinal contours, when accounting for differences in technique. Status post median sternotomy. No acute osseous abnormality. No focal airspace opacity, pleural effusion, or pneumothorax. IMPRESSION: No acute cardiopulmonary process. Electronically Signed   By: Merilyn Baba M.D.   On: 11/30/2020 15:19    Procedures Procedures   Medications Ordered in ED Medications - No data to display  ED Course  I have reviewed the triage vital signs and the nursing notes.  Pertinent labs & imaging results that were available during my care of the patient were reviewed by me and considered in my medical decision making (see chart for details).  Clinical Course as of 11/30/20 1910  Wed Nov 30, 2020  1537 Went to see patient for evaluation and he is currently in XR.  Chest XR is negative. EKG with no significant change from prior. Shows Left axis deviation; Inferior infarct , age undetermined; T wave abnormality [GL]  1616 Evaluated patient. Workup largely unremarkable thus far. Patient does not currently have chest pain, but does have some lightheadedness and generalized weakness. Consulting Cardiology due to extensive cardiac history.  [GL]  1625 HEART score 5. Waiting on Cardiology and final troponin to result  [GL]  1806 Second troponin negative.  [GL]    Clinical Course User Index [GL] , Adora Fridge, PA-C   MDM Rules/Calculators/A&P HEAR Score: 5                         This is a 60 y.o. male with an extensive cardiac history s/p CABG, stent x 2, HFrEF with ICD in place, Presented to ED with isolated exertional chest pain 2 days ago with associated dizziness, nausea, lightheadedness, and generalized weakness that has persisted and is still present.  He is mildly bradycardic and hypotensive.  Oxygenating well on RA. PE largely unremarkable. DDX consideration for ACS, Stable Angina, orthostatic hypotension.  Labs and Imaging reviewed.  Initial EKG shows some t wave inversions and first degreee heart block that are unchanged from EKG in May 2022.  CXR is unremarkable.  Initial Troponin negative. BNP is slightly elevated. All other labs are unremarkable. Currently have low suspicion for ACS. Cardiology was consulted due to patients extensive cardiac history to determine if he needs any medication changes. Second troponin negative. Patient was seen by cardiology and deemed appropriate for discharge. They will not make any medication changes at this time.    Final Clinical Impression(s) / ED Diagnoses Final diagnoses:  Chest pain, unspecified type  Lightheadedness  Generalized weakness    Rx / DC Orders ED Discharge Orders     None        Adolphus Birchwood, PA-C 11/30/20 1910    Lacretia Leigh, MD 12/01/20 1350

## 2020-11-30 NOTE — ED Triage Notes (Signed)
Pt reports chest pain after playing with his grandkids all day, significant cardiac hx. Pt also c.o feeling dizzy. BP 90s in triage. Pt has ICD in place.

## 2020-11-30 NOTE — ED Provider Notes (Signed)
Emergency Medicine Provider Triage Evaluation Note  HOLTON HEMANI , a 60 y.o. male  was evaluated in triage.  Pt complains of chest pain, shortness of breath and dizziness.  Symptoms began yesterday and have persisted today.  He was playing with his grandson's when symptoms began yesterday.  Pain has been constant.  Extensive cardiac history.  Has not tried medications to help with symptoms.  Review of Systems  Positive: Chest pain, shortness of breath, dizziness Negative: Headache  Physical Exam  There were no vitals taken for this visit. Gen:   Awake, no distress   Resp:  Normal effort  MSK:   Moves extremities without difficulty  Other:  No signs of respiratory distress no facial asymmetry  Medical Decision Making  Medically screening exam initiated at 2:25 PM.  Appropriate orders placed.  [name] was informed that the remainder of the evaluation will be completed by another provider, this initial triage assessment does not replace that evaluation, and the importance of remaining in the ED until their evaluation is complete.  Imaging ordered.  Patient hypotensive to 90 systolic in triage and will be roomed asap   Delia Heady, PA-C 11/30/20 [place], MD 12/03/20 [phone]

## 2020-11-30 NOTE — Discharge Instructions (Signed)
Your work-up for chest pain related to your heart, was negative today.  Per your cardiologist recommendations, please continue to hydrate yourself well at home especially if you are outside in the heat.  Monitor your blood pressures at home.  Follow-up with your cardiologist as planned.  There are no medication changes required today.  If you develop worsening chest pain, shortness of breath, weakness --especially if it is persistent --please return to the emergency department for further evaluation.

## 2020-11-30 NOTE — ED Provider Notes (Signed)
I provided a substantive portion of the care of this patient.  I personally performed the entirety of the medical decision making for this encounter.  EKG Interpretation  Date/Time:  Wednesday November 30 2020 14:23:28 EDT Ventricular Rate:  61 PR Interval:  242 QRS Duration: 90 QT Interval:  440 QTC Calculation: 442 R Axis:   -36 Text Interpretation: Sinus rhythm with 1st degree A-V block Left axis deviation Inferior infarct , age undetermined T wave abnormality, consider lateral ischemia No significant change since last tracing Confirmed by Blanchie Dessert [phone]) on 11/30/2020 2:27:60 PM   60 year old male presents with dizzy and weakness.  Had chest pain several days ago which lasted for 10 minutes and since resolved.  Denies a history of volume loss.  EKG unchanged.  Troponin negative.  Was slightly hypotensive here.  We have cardiology see him and medications adjustment   Lacretia Leigh, MD 11/30/20 [phone]

## 2020-11-30 NOTE — Consult Note (Addendum)
Cardiology Consultation:   Patient ID: [name]; [id number]; 11-19-1960   Admit date: 11/30/2020 Date of Consult: 11/30/2020  Primary Care Provider: Ladell Pier, MD Primary Cardiologist: Dr. Martinique, MD Primary Electrophysiologist: Dr. Rayann Heman, MD   Patient Profile:   [name] is a 60 y.o. male with a hx of CAD s/p CABG 2014, ischemic CM, HFrEF (25%) s/p ICD, prediabetes, history of cardiac tamponade, pericarditis, history of CVA, HTN, and HLD who is being seen today for the evaluation of chest pain at the request of Dr. Zenia Resides.   History of Present Illness:   [name] is a 60 y.o. male with a hx as stated above who presented to [facility] 11/30/20 with chest pain, SOB, and dizziness.  Patient reports he has been in his usual state of health until playing with his grandchildren earlier today.  He reports that they have been busy playing and going to the park.  After returning, he reports he had 1 brief episode of anterior chest pain with mild shortness of breath and diaphoresis however he is unclear if this is from hot temperatures outside. He reports he sat down with symptom resolution.  Symptoms have not returned.  He feels he was simply overexerting himself however was a little concerned with the dizziness. Given this, he presented to the ED for further evaluation.    In the ED, labs appear to be relatively stable. BNP mildly elevated at 144 however CXR with no acute cardiopulmonary disease. He does not appear overly fluid volume overloaded on exam.  HsT stable at 5. EKG without acute changes, TWI present on prior tracings.  He is currently chest pain-free. Reports he was likely over-exerting himself. BP a little soft today. Have asked him to watch this more closely at home. Does not typically get dizzy.    He had a stent to the LCx and RCA in 2007.  Repeat LHC 12/2012 revealed totally occluded RCA, 50% in-stent restenosis of the LCx stent at which time medical therapy was  recommended.  He was admitted in 01/2013 due to large pericardial effusion and tamponade found to have a ruptured LV pseudoaneurysm.  Pericardial drain was placed. Unfortunately prior to the discharge, he developed signs of stroke with left arm weakness in the left visual field deficit. Repeat echocardiogram showed complex pericardial effusion with right ventricular collapse and tamponade. He underwent emergent median sternotomy and repair of the ruptured inferoseptal posterior LV pseudoaneurysm with CorMatrix patch and CABG x1 with SVG to OM by Dr. Roxan Hockey. 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  Echocardiogram at the time was 25% with moderate pericardial effusion. He had some improvement in LVEF by  05/2015 to 30 to 35%. Due to persistently low ejection fraction despite optimized medical therapy, he underwent ICD implantation by Dr. Rayann Heman on 05/25/2020.   He was recently admitted 09/11/20 to 09/14/20 for the evaluation of chest pain. EKG was unchanged however there was a rise in HsT to 38. Given his hx he underwent an echocardiogram  09/11/2020 that showed EF 25 to 30%, grade 2 DD, trivial MR. LHC performed 09/13/2020 showed chronically occluded proximal to mid RCA, 40% proximal LAD, 50% ostial to proximal left circumflex lesion, no SVG to OM was visualized and was felt to be occluded, LVEDP was normal.  Medical therapy was recommended.  Carvedilol and Entresto were restarted prior to discharge. He was then seen in follow  up 10/05/20 at which time he had no cardiac complaints.   Past Medical History:  Diagnosis Date   Abnormal EKG    hx of ischemia showing up on ekg's   Acute myopericarditis    a. 03/2013: readm with hypoxia, tachycardia, elevated ESR/CRP, elevated troponin with Dressler syndrome and myopericarditis; treated with steroids.   Acute respiratory failure ([place])    a. 01/2013: VDRF. b. 03/2013:  hypoxia requiring supp O2 during adm, resolved by discharge.   C. difficile colitis    a. 01/2013 during prolonged adm. b. Recurred 03/2013.   Cardiac tamponade    a. 01/2013 s/p drain.   Cataracts, bilateral    Coronary artery disease    a. s/p MI in 2009 in [place] with stenting of the LCX and LAD;  b. 12/2012 NSTEMI/CAD: LM nl, LAD patent prox stent, LCX 50-70 isr (FFR 0.84), RCA dom, 161m EF 40-45%-->Med Rx. c. 01/2013: anterolateral STEMI complicated by pericardial effusion (presumed purulent pericarditis) and tamponade s/p drain, ruptured LV pseudoaneurysm s/p CorMatrix patch, CABGx1 (SVG-OM1), fever, CVA, VDRF, C diff.   CVA (cerebral infarction)    a. 01/2013 in setting of prolonged hospitalization - residual L arm weakness.   [place]ler [place]    a. 03/2013: readm with hypoxia, tachycardia, elevated ESR/CRP, elevated troponin with Dressler syndrome and myopericarditis; treated with steroids.   Glaucoma    Hyperlipidemia    Hypokalemia    Hyponatremia    a. During late 2014.   Ischemic cardiomyopathy    a. Sept/Oct 2014: EF ~40% (ICM). b. 03/2013: EF 25-30%. Off ACEI due to hypotension (MIXED NICM/ICM).   Optic neuropathy, ischemic    Pericardial effusion    a. 01/2013: pericardial effusion (presumed purulent pericarditis) and cardiac tamponade s/p drain. b. Persistent moderate pericardial effusion 03/2013.   Pre-diabetes    Pseudoaneurysm of left ventricle of heart    a. 01/2013:  Ruptured inferoposterior LV pseudoaneurysm s/p CorMatrix patch.   Sinus bradycardia    asymptomatic   Stroke ([facility] 2009   weak lt side-lt arm   Tobacco abuse    Wears dentures    top    Past Surgical History:  Procedure Laterality Date   CARDIAC CATHETERIZATION  01/06/2013   COLONOSCOPY WITH PROPOFOL N/A 07/03/2016   Procedure: COLONOSCOPY WITH PROPOFOL;  Surgeon: SManus Gunning MD;  Location: [facility];  Service: Gastroenterology;  Laterality: N/A;   CORONARY ANGIOPLASTY WITH  STENT PLACEMENT  2000's X 2   "1 + 1" (01/06/2013)   CORONARY ARTERY BYPASS GRAFT N/A 01/28/2013   Procedure: CORONARY ARTERY BYPASS GRAFTING (CABG);  Surgeon: SMelrose Nakayama MD;  [place] [facility]  Service: Open Heart Surgery;  Laterality: N/A;  CABG x one, using left greater saphenous vein harvested endoscopically   ICD IMPLANT N/A 05/25/2020   Procedure: ICD IMPLANT;  Surgeon: AThompson Grayer MD;  [facility];  Service: Cardiovascular;  Laterality: N/A;   INTRAOPERATIVE TRANSESOPHAGEAL ECHOCARDIOGRAM N/A 01/28/2013   Procedure: INTRAOPERATIVE TRANSESOPHAGEAL ECHOCARDIOGRAM;  Surgeon: SMelrose Nakayama MD;  [place] [facility]  Service: Open Heart Surgery;  Laterality: N/A;   LEFT HEART CATH AND CORS/GRAFTS ANGIOGRAPHY N/A 09/13/2020   Procedure: LEFT HEART CATH AND CORS/GRAFTS ANGIOGRAPHY;  Surgeon: JMartinique Peter M, MD;  [facility];  Service: Cardiovascular;  Laterality: N/A;   LEFT HEART CATHETERIZATION WITH CORONARY ANGIOGRAM N/A 01/06/2013   Procedure: LEFT HEART CATHETERIZATION WITH CORONARY ANGIOGRAM;  Surgeon: Peter M JMartinique MD;  [facility];  Service:  Cardiovascular;  Laterality: N/A;   LEFT HEART CATHETERIZATION WITH CORONARY ANGIOGRAM N/A 01/21/2013   Procedure: LEFT HEART CATHETERIZATION WITH CORONARY ANGIOGRAM;  Surgeon: Burnell Blanks, MD;  Location: [facility] CATH LAB;  Service: Cardiovascular;  Laterality: N/A;   LUMBAR LAMINECTOMY/ DECOMPRESSION WITH MET-RX Right 06/30/2018   Procedure: Right minimally invasive Lumbar Three-Four Far lateral discectomy;  Surgeon: Judith Part, MD;  Location: [place];  Service: Neurosurgery;  Laterality: Right;  Right minimally invasive Lumbar Three-Four Far lateral discectomy   MASS EXCISION Right 07/21/2014   Procedure: EXCISION RIGHT NECK MASS;  Surgeon: Erroll Luna, MD;  Location: [place];  Service: General;  Laterality: Right;   PERICARDIAL TAP N/A 01/24/2013   Procedure:  PERICARDIAL TAP;  Surgeon: Blane Ohara, MD;  Location: [facility] CATH LAB;  Service: Cardiovascular;  Laterality: N/A;   RIGHT HEART CATHETERIZATION Right 01/24/2013   Procedure: RIGHT HEART CATH;  Surgeon: Blane Ohara, MD;  Location: [facility] CATH LAB;  Service: Cardiovascular;  Laterality: Right;   VENTRICULAR ANEURYSM RESECTION N/A 01/28/2013   Procedure: LEFT VENTRICULAR ANEURYSM REPAIR;  Surgeon: Melrose Nakayama, MD;  Location: [place];  Service: Open Heart Surgery;  Laterality: N/A;     Prior to Admission medications   Medication Sig Start Date End Date Taking? Authorizing Provider  ALPHAGAN P 0.1 % SOLN INSTILL 1 DROP INTO BOTH EYES TWICE A DAY Patient taking differently: Place 1 drop into both eyes 2 (two) times daily. 10/26/19  Yes Ladell Pier, MD  aspirin 81 MG EC tablet Take 1 tablet (81 mg total) by mouth daily. Restart on 07/05/2018 Patient taking differently: Take 81 mg by mouth daily. 06/30/18  Yes Judith Part, MD  atorvastatin (LIPITOR) 80 MG tablet TAKE 1 TABLET BY MOUTH EVERY DAY Patient taking differently: Take 80 mg by mouth daily. 04/21/20  Yes Ladell Pier, MD  carvedilol (COREG) 25 MG tablet TAKE 1 TABLET BY MOUTH TWICE A DAY WITH A MEAL Patient taking differently: Take 25 mg by mouth 2 (two) times daily with a meal. 06/20/20  Yes Martinique, Peter M, MD  Cholecalciferol (VITAMIN D) 125 MCG (5000 UT) CAPS Take 5,000 Units by mouth daily.   Yes [provider]  dapagliflozin propanediol (FARXIGA) 10 MG TABS tablet Take 1 tablet (10 mg total) by mouth daily. 10/14/20  Yes Martinique, Peter M, MD  ENTRESTO 97-103 MG TAKE 1 TABLET BY MOUTH TWICE A DAY Patient taking differently: Take 1 tablet by mouth 2 (two) times daily. 05/05/20  Yes Martinique, Peter M, MD  furosemide (LASIX) 20 MG tablet TAKE 1 TABLET BY MOUTH EVERY DAY Patient taking differently: Take 20 mg by mouth daily. 03/23/20  Yes Ladell Pier, MD  gabapentin (NEURONTIN) 300 MG capsule TAKE 1 CAPSULE  BY MOUTH EVERYDAY AT BEDTIME Patient taking differently: Take 300 mg by mouth daily. 05/07/20  Yes Ladell Pier, MD  latanoprost (XALATAN) 0.005 % ophthalmic solution Place 1 drop into both eyes at bedtime. 05/31/16  Yes [provider]  methocarbamol (ROBAXIN) 500 MG tablet TAKE 1 TABLET (500 MG TOTAL) BY MOUTH 2 (TWO) TIMES DAILY AS NEEDED FOR MUSCLE SPASMS. 10/18/20  Yes Ladell Pier, MD  Multiple Vitamin (MULTIVITAMIN WITH MINERALS) TABS tablet Take 1 tablet by mouth daily. Centrum Silver   Yes [provider]  nitroGLYCERIN (NITROSTAT) 0.4 MG SL tablet Place 1 tablet (0.4 mg total) under the tongue every 5 (five) minutes x 3 doses as needed for chest pain. 09/14/20  Yes Reino Bellis B, NP  spironolactone (ALDACTONE) 25 MG tablet Take 1 tablet (25 mg total) by mouth daily. 04/22/20 11/30/20 Yes Ladell Pier, MD  traMADol (ULTRAM) 50 MG tablet Take 1 tablet (50 mg total) by mouth every 8 (eight) hours as needed. Patient taking differently: Take 50 mg by mouth every 8 (eight) hours as needed for moderate pain. 07/21/20  Yes Ladell Pier, MD  nicotine (NICODERM CQ - DOSED IN MG/24 HOURS) 21 mg/24hr patch Place 1 patch (21 mg total) onto the skin daily. Patient not taking: Reported on 11/30/2020 07/21/20   Ladell Pier, MD    Inpatient Medications: Scheduled Meds:  Continuous Infusions:  PRN Meds:   Allergies:   No Known Allergies  Social History:   Social History   Socioeconomic History   Marital status: Single    Spouse name: Not on file   Number of children: 2   Years of education: 12th   Highest education level: Not on file  Occupational History   Occupation: N/A  Tobacco Use   Smoking status: Every Day    Packs/day: 1.00    Years: 35.00    Pack years: 35.00    Types: Cigarettes   Smokeless tobacco: Never  Vaping Use   Vaping Use: Never used  Substance and Sexual Activity   Alcohol use: No    Alcohol/week: 0.0 standard drinks    Drug use: No   Sexual activity: Yes  Other Topics Concern   Not on file  Social History Narrative   Lives in [place] with wife.  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  He is now retired on disability.  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      Caffeine Use: very rarely   Social Determinants of Radio broadcast assistant Strain: Not on file  Food Insecurity: Not on file  Transportation Needs: Not on file  Physical Activity: Not on file  Stress: Not on file  Social Connections: Not on file  Intimate Partner Violence: Not on file    Family History:   Family History  Problem Relation Age of Onset   Heart attack Mother    Diabetes Mother    Hypertension Mother    Hypertension Father    CAD Other    HIV Brother    Stroke Neg Hx    Family Status:  Family Status  Relation Name Status   Mother  Deceased   Father  Deceased   Other  (Not Specified)   Brother  Alive   Brother  Alive   Brother  Deceased   Neg Hx  (Not Specified)    ROS:  Please see the history of present illness.  All other ROS reviewed and negative.     Physical Exam/Data:   Vitals:   11/30/20 1600 11/30/20 1615 11/30/20 1630 11/30/20 1645  BP: 100/63 102/66 108/86 102/72  Pulse: (!) 56 (!) 53 (!) 51 (!) 50  Resp: (!) 23 17 15 17   Temp:      TempSrc:      SpO2: 98% 99% 99% 98%  Weight:      Height:       No intake or output data in the 24 hours ending 11/30/20 1721 Filed Weights   11/30/20 1425  Weight: 120.2 kg   Body mass index is 34.96 kg/m.   General: Well developed, well nourished, NAD Neck: Negative for carotid bruits. No JVD Lungs:Clear to ausculation bilaterally. No  wheezes, rales, or rhonchi. Breathing is unlabored. Cardiovascular: RRR with S1 S2. No murmurs Extremities: No edema. Radial pulses 2+ bilaterally Neuro: Alert and oriented. No focal deficits. No facial asymmetry. MAE spontaneously. Psych: Responds to questions appropriately with normal affect.     EKG:  The EKG was personally reviewed and demonstrates: 11/30/20 NSR with 1st degree AV block, diffuse TWI however present on prior tracings.  Telemetry:  Telemetry was personally reviewed and demonstrates: 11/30/20 NSR  Relevant CV Studies:  Cath: 09/13/20   Prox LAD lesion is 40% stenosed. Prox LAD to Mid LAD lesion is 10% stenosed. Prox Cx to Mid Cx lesion is 20% stenosed. Ost Cx to Prox Cx lesion is 50% stenosed. Prox RCA to Mid RCA lesion is 100% stenosed. SVG graft was not visualized due to known occlusion. Origin to Prox Graft lesion is 100% stenosed. LV end diastolic pressure is normal.   1. Single vessel occlusive CAD with CTO of the mid RCA.  2. Patent stents in the mid LAD and LCx.  3. No SVG to OM visualized but is felt to be occluded. 4. Low LVEDP 3 mm Hg   Plan: recommend continued medical management.    Diagnostic Dominance: Right     Echo: 09/11/20   ECHO 09/11/20:  1. Left ventricular ejection fraction, by estimation, is 25 to 30%. The  left ventricle has severely decreased function. The left ventricle  demonstrates regional wall motion abnormalities (see scoring  diagram/findings for description). Left ventricular  diastolic parameters are consistent with Grade II diastolic dysfunction  (pseudonormalization). Elevated left ventricular end-diastolic pressure.  There is of the left ventricular, . There is of the left ventricular,.   2. Right ventricular systolic function was not well visualized. The right  ventricular size is normal. Tricuspid regurgitation signal is inadequate  for assessing PA pressure.   3. The mitral valve is grossly normal. Trivial mitral valve  regurgitation.   4. The aortic valve is tricuspid. Aortic valve regurgitation is not  visualized. No aortic stenosis is present.   5. The inferior vena cava is normal in size with <50% respiratory  variability, suggesting right atrial pressure of 8 mmHg.   Comparison(s): No significant change  from prior study.  Laboratory Data:  Chemistry Recent Labs  Lab 11/30/20 1432  NA 138  K 4.0  CL 109  CO2 21*  GLUCOSE 116*  BUN 14  CREATININE 1.07  CALCIUM 9.3  GFRNONAA >60  ANIONGAP 8    Total Protein  Date Value Ref Range Status  02/29/2020 6.5 6.0 - 8.5 g/dL Final   Albumin  Date Value Ref Range Status  02/29/2020 4.3 3.8 - 4.9 g/dL Final   AST  Date Value Ref Range Status  02/29/2020 14 0 - 40 IU/L Final   ALT  Date Value Ref Range Status  02/29/2020 15 0 - 44 IU/L Final   Alkaline Phosphatase  Date Value Ref Range Status  02/29/2020 137 (H) 44 - 121 IU/L Final    Comment:                  **Please note reference interval change**   Bilirubin Total  Date Value Ref Range Status  02/29/2020 0.3 0.0 - 1.2 mg/dL Final   Hematology Recent Labs  Lab 11/30/20 1432  WBC 4.8  RBC 5.25  HGB 15.7  HCT 46.4  MCV 88.4  MCH 29.9  MCHC 33.8  RDW 13.0  PLT 164   Cardiac EnzymesNo results for input(s): TROPONINI in  the last 168 hours. No results for input(s): TROPIPOC in the last 168 hours.  BNP Recent Labs  Lab 11/30/20 1432  BNP 144.6*    DDimer No results for input(s): DDIMER in the last 168 hours. TSH:  Lab Results  Component Value Date   TSH 0.604 01/05/2013   Lipids: Lab Results  Component Value Date   CHOL 162 02/29/2020   HDL 57 02/29/2020   LDLCALC 85 02/29/2020   TRIG 110 02/29/2020   CHOLHDL 2.8 02/29/2020   HgbA1c: Lab Results  Component Value Date   HGBA1C 6.2 (H) 02/29/2020    Radiology/Studies:  DG Chest 2 View  Result Date: 11/30/2020 CLINICAL DATA:  Chest pain, dizziness EXAM: CHEST - 2 VIEW COMPARISON:  09/10/2020 FINDINGS: Left chest cardiac device with single lead. Unchanged cardiac and mediastinal contours, when accounting for differences in technique. Status post median sternotomy. No acute osseous abnormality. No focal airspace opacity, pleural effusion, or pneumothorax. IMPRESSION: No acute cardiopulmonary  process. Electronically Signed   By: Merilyn Baba M.D.   On: 11/30/2020 15:19    Assessment and Plan:   1. Chest pain with CAD s/p CABG: -Pt presented to [facility]/30/20 with chest pain, SOB, and dizziness.  Patient reports he has been in his usual state of health until playing with his grandchildren earlier today.  He reports that they have been busy playing and going to the park.  After returning, he reports he had 1 brief episode of anterior chest pain with mild shortness of breath and diaphoresis however he is unclear if this is from hot temperatures outside. He reports he sat down with symptom resolution. Symptoms have not returned.  He feels he was simply overexerting himself however was a little concerned with the dizziness.  -Labs appear to be relatively stable.  -BNP mildly elevated at 144 however CXR with no acute cardiopulmonary disease. He does not appear overly fluid volume overloaded on exam.   -HsT stable at 5>>4 with EKG without acute changes, TWI present on prior tracings. -Recent LHC performed 09/13/20 with stable coronary anatomy with recommendations for continued medical therapy.  -Given one brief chest pain episode with normal HsT, no change on EKG and full cardiac workup approximately two months agio, would make no change at this time and keep follow up as scheduled. If pain becomes more frequent or more intense will need to come back to the ED.    2. Ischemic cardiomyopathy s/p ICD: -Last echocardiogram 09/11/2020 that showed EF 25 to 30%, grade 2 DD, trivial MR. -Follow with Dr. Rayann Heman -Does not appear volume overloaded -Continue current regimen   4. HTN: -On the softer side today however negative orthostatics and no reports of persistent dizziness at home. I have asked him to follow this at home on a regular basis and stay hydrated if he is outside in the heat. If this becomes more persistent, may need to reduce Entresto dose to mid-dose.   For questions or updates, please  contact [place] Please consult [email or web address] for contact info under Cardiology/STEMI.   Signed, Kathyrn Drown NP-C [facility] Pager: [phone] 11/30/2020 5:21 PM  I have examined the patient and reviewed assessment and plan and discussed with patient.  Agree with above as stated.  He feels well now.  No fluid overload on exam. Negative w/u with two negative troponins.  Stay well hydrated.  Continue current meds.  If dizziness persists, could reduce carvedilol.  Also on high dose entresto. OK to discharge from the ER to  home.  Larae Grooms

## 2020-12-12 ENCOUNTER — Other Ambulatory Visit (HOSPITAL_COMMUNITY): Payer: Self-pay

## 2020-12-15 NOTE — Progress Notes (Signed)
Remote ICD transmission.   

## 2020-12-20 ENCOUNTER — Ambulatory Visit (INDEPENDENT_AMBULATORY_CARE_PROVIDER_SITE_OTHER): Payer: Medicare HMO

## 2020-12-20 DIAGNOSIS — I5022 Chronic systolic (congestive) heart failure: Secondary | ICD-10-CM | POA: Diagnosis not present

## 2020-12-20 DIAGNOSIS — Z95810 Presence of automatic (implantable) cardiac defibrillator: Secondary | ICD-10-CM | POA: Diagnosis not present

## 2020-12-21 NOTE — Progress Notes (Signed)
EPIC Encounter for ICM Monitoring  Patient Name: [name] is a 60 y.o. male Date: 12/21/2020 Primary Care Physican: Ladell Pier, MD Primary Cardiologist: Martinique Electrophysiologist: Allred 11/16/2020 Weight: 263 lbs                                                            Spoke with patient. He is doing well.  Discussed diet and fluid intake.  He reports drinking >64 oz fluid daily which during decreased impedance.    CorVue thoracic impedance normal but was suggesting possible fluid accumulation from 8/20-9/2.   Prescribed: Furosemide 20 mg take 1 tablet by mouth daily. Spironolactone 25 mg take 1 tablet daily   Recommendations: Recommendation to limit salt intake to 2000 mg daily and fluid intake to 64 oz daily.  Encouraged to call if experiencing any fluid symptoms.    Follow-up plan: ICM clinic phone appointment on 01/23/2021.   91 day device clinic remote transmission 02/22/2021.     EP/Cardiology Office Visits: 01/31/2021 with Dr. Martinique.  08/28/2021 with Dr Rayann Heman.   Copy of ICM check sent to Dr. Rayann Heman.    3 month ICM trend: 12/20/2020.    1 Year ICM trend:       Rosalene Billings, RN 12/21/2020 3:19 PM

## 2021-01-23 ENCOUNTER — Ambulatory Visit (INDEPENDENT_AMBULATORY_CARE_PROVIDER_SITE_OTHER): Payer: Medicare HMO

## 2021-01-23 DIAGNOSIS — H401132 Primary open-angle glaucoma, bilateral, moderate stage: Secondary | ICD-10-CM | POA: Diagnosis not present

## 2021-01-23 DIAGNOSIS — Z95810 Presence of automatic (implantable) cardiac defibrillator: Secondary | ICD-10-CM | POA: Diagnosis not present

## 2021-01-23 DIAGNOSIS — R6889 Other general symptoms and signs: Secondary | ICD-10-CM | POA: Diagnosis not present

## 2021-01-23 DIAGNOSIS — I5022 Chronic systolic (congestive) heart failure: Secondary | ICD-10-CM | POA: Diagnosis not present

## 2021-01-23 NOTE — Progress Notes (Signed)
EPIC Encounter for ICM Monitoring  Patient Name: [name] is a 60 y.o. male Date: 01/23/2021 Primary Care Physican: Ladell Pier, MD Primary Cardiologist: Martinique Electrophysiologist: Allred 01/23/2021 Weight: 263 lbs                                                            Spoke with patient. He is doing well.  As discussed at previous ICM call, encouraged to limit fluid intake >64 oz daily. He reports drinking more than 64 oz daily.    CorVue thoracic impedance suggesting possible ongoing fluid accumulation starting 10/5.  Also decreased impedance from 9/22-10/1.   Prescribed: Furosemide 20 mg take 1 tablet by mouth daily. Spironolactone 25 mg take 1 tablet daily   Recommendations: Recommendation to limit salt intake to 2000 mg daily and fluid intake to 64 oz daily.  Encouraged to call if experiencing any fluid symptoms.    Follow-up plan: ICM clinic phone appointment on 01/31/2021 to recheck fluid levels.   91 day device clinic remote transmission 02/22/2021.     EP/Cardiology Office Visits: 01/31/2021 with Dr. Martinique.  08/28/2021 with Dr Rayann Heman.   Copy of ICM check sent to Dr. Rayann Heman and Dr Martinique for review.     3 month ICM trend: 01/23/2021.    1 Year ICM trend:       Rosalene Billings, RN 01/23/2021 1:22 PM

## 2021-01-27 NOTE — Progress Notes (Signed)
Cardiology Office Note:    Date:  01/31/2021   ID:  [name], DOB 11/24/1960, MRN [id number]  PCP:  Ladell Pier, MD   [facility] Providers Cardiologist:  Baldwin Racicot Martinique, MD Electrophysiologist:  Thompson Grayer, MD     Referring MD: Ladell Pier, MD   Chief Complaint  Patient presents with   Congestive Heart Failure      History of Present Illness:    [name] is a 60 y.o. male with a hx of CAD s/p CABG 2014, ischemic cardiomyopathy s/p ICD, HTN, HLD, prediabetes, history of cardiac tamponade, pericarditis and history of CVA.  He had a stent to left circumflex and RCA in 2007.  Repeat cardiac catheterization in September 2014 revealed totally occluded RCA, 50% in-stent restenosis of the left circumflex stent, medical therapy was recommended.  He was admitted in October 2014 due to large pericardial effusion and tamponade due to ruptured LV pseudoaneurysm.  Pericardial drain was placed.  Unfortunately prior to the discharge, he developed signs of stroke with left arm weakness in the left visual field deficit.  He then developed hemodynamic collapse and required intubation.  Repeat echocardiogram showed complex pericardial effusion with right ventricular collapse and tamponade.  He underwent emergent median sternotomy and repair of the ruptured inferoseptal posterior LV pseudoaneurysm with CorMatrix patch and CABG x1 with SVG to OM by Dr. Roxan Hockey.  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  Echocardiogram at the time was 25% with moderate pericardial effusion.  By February 2017, ejection fraction has improved to 30 to 35%.  Due to persistently low ejection fraction despite optimized medical therapy, he underwent ICD implantation by Dr. Rayann Heman on 05/25/2020.  Patient presented to the hospital on 09/02/2020 with chest pain.  EKG was unchanged.  First troponin was  negative however second troponin went up to 38.  He did have hypotension, carvedilol and Entresto were held on arrival.  Subsequent echocardiogram obtained on 09/11/2020 showed EF 25 to 30%, grade 2 DD, trivial MR.  Patient underwent cardiac catheterization on 09/13/2020 that showed chronically occluded proximal to mid RCA, 40% proximal LAD, 50% ostial to proximal left circumflex lesion, no SVG to OM was visualized and was felt to be occluded, LVEDP was normal.  Medical therapy was recommended.  He was placed on Farxiga as he had nausea and vomiting with Jardiance.  Carvedilol and Entresto were restarted prior to discharge.  He was seen by our service when he presented to the ED in August with chest pain. No recurrence.   On follow up today he is doing well. No chest pain, dyspnea, palpitations or edema. Weight has actually come down. Tries to watch salt intake but still eats some salty choices. Is still smoking some. Recent ICD check suggested fluid accumulation over the past 12 days.    Past Medical History:  Diagnosis Date   Abnormal EKG    hx of ischemia showing up on ekg's   Acute myopericarditis    a. 03/2013: readm with hypoxia, tachycardia, elevated ESR/CRP, elevated troponin with Dressler syndrome and myopericarditis; treated with steroids.   Acute respiratory failure ([place])    a. 01/2013: VDRF. b. 03/2013: hypoxia requiring supp O2 during adm, resolved by discharge.   C. difficile colitis    a. 01/2013 during prolonged adm. b. Recurred 03/2013.   Cardiac tamponade    a. 01/2013 s/p drain.   Cataracts, bilateral  Coronary artery disease    a. s/p MI in 2009 in [place] with stenting of the LCX and LAD;  b. 12/2012 NSTEMI/CAD: LM nl, LAD patent prox stent, LCX 50-70 isr (FFR 0.84), RCA dom, 190m EF 40-45%-->Med Rx. c. 01/2013: anterolateral STEMI complicated by pericardial effusion (presumed purulent pericarditis) and tamponade s/p drain, ruptured LV pseudoaneurysm s/p CorMatrix patch, CABGx1  (SVG-OM1), fever, CVA, VDRF, C diff.   CVA (cerebral infarction)    a. 01/2013 in setting of prolonged hospitalization - residual L arm weakness.   [place]    a. 03/2013: readm with hypoxia, tachycardia, elevated ESR/CRP, elevated troponin with Dressler syndrome and myopericarditis; treated with steroids.   Glaucoma    Hyperlipidemia    Hypokalemia    Hyponatremia    a. During late 2014.   Ischemic cardiomyopathy    a. Sept/Oct 2014: EF ~40% (ICM). b. 03/2013: EF 25-30%. Off ACEI due to hypotension (MIXED NICM/ICM).   Optic neuropathy, ischemic    Pericardial effusion    a. 01/2013: pericardial effusion (presumed purulent pericarditis) and cardiac tamponade s/p drain. b. Persistent moderate pericardial effusion 03/2013.   Pre-diabetes    Pseudoaneurysm of left ventricle of heart    a. 01/2013:  Ruptured inferoposterior LV pseudoaneurysm s/p CorMatrix patch.   Sinus bradycardia    asymptomatic   Stroke ([facility] 2009   weak lt side-lt arm   Tobacco abuse    Wears dentures    top    Past Surgical History:  Procedure Laterality Date   CARDIAC CATHETERIZATION  01/06/2013   COLONOSCOPY WITH PROPOFOL N/A 07/03/2016   Procedure: COLONOSCOPY WITH PROPOFOL;  Surgeon: SManus Gunning MD;  Location: [facility];  Service: Gastroenterology;  Laterality: N/A;   CORONARY ANGIOPLASTY WITH STENT PLACEMENT  2000's X 2   "1 + 1" (01/06/2013)   CORONARY ARTERY BYPASS GRAFT N/A 01/28/2013   Procedure: CORONARY ARTERY BYPASS GRAFTING (CABG);  Surgeon: SMelrose Nakayama MD;  [place] [facility]  Service: Open Heart Surgery;  Laterality: N/A;  CABG x one, using left greater saphenous vein harvested endoscopically   ICD IMPLANT N/A 05/25/2020   Procedure: ICD IMPLANT;  Surgeon: AThompson Grayer MD;  [facility];  Service: Cardiovascular;  Laterality: N/A;   INTRAOPERATIVE TRANSESOPHAGEAL ECHOCARDIOGRAM N/A 01/28/2013   Procedure: INTRAOPERATIVE TRANSESOPHAGEAL  ECHOCARDIOGRAM;  Surgeon: SMelrose Nakayama MD;  [place]  Service: Open Heart Surgery;  Laterality: N/A;   LEFT HEART CATH AND CORS/GRAFTS ANGIOGRAPHY N/A 09/13/2020   Procedure: LEFT HEART CATH AND CORS/GRAFTS ANGIOGRAPHY;  Surgeon: JMartinique Ayvin Lipinski M, MD;  [facility];  Service: Cardiovascular;  Laterality: N/A;   LEFT HEART CATHETERIZATION WITH CORONARY ANGIOGRAM N/A 01/06/2013   Procedure: LEFT HEART CATHETERIZATION WITH CORONARY ANGIOGRAM;  Surgeon: Joellen Tullos M JMartinique MD;  [facility];  Service: Cardiovascular;  Laterality: N/A;   LEFT HEART CATHETERIZATION WITH CORONARY ANGIOGRAM N/A 01/21/2013   Procedure: LEFT HEART CATHETERIZATION WITH CORONARY ANGIOGRAM;  Surgeon: CBurnell Blanks MD;  [facility];  Service: Cardiovascular;  Laterality: N/A;   LUMBAR LAMINECTOMY/ DECOMPRESSION WITH MET-RX Right 06/30/2018   Procedure: Right minimally invasive Lumbar Three-Four Far lateral discectomy;  Surgeon: OJudith Part MD;  [place]  Service: Neurosurgery;  Laterality: Right;  Right minimally invasive Lumbar Three-Four Far lateral discectomy   MASS EXCISION Right 07/21/2014   Procedure: EXCISION RIGHT NECK MASS;  Surgeon: TErroll Luna MD;  [place] [facility]  Service: General;  Laterality:  Right;   PERICARDIAL TAP N/A 01/24/2013   Procedure: PERICARDIAL TAP;  Surgeon: Blane Ohara, MD;  [facility] LAB;  Service: Cardiovascular;  Laterality: N/A;   RIGHT HEART CATHETERIZATION Right 01/24/2013   Procedure: RIGHT HEART CATH;  Surgeon: Blane Ohara, MD;  [facility];  Service: Cardiovascular;  Laterality: Right;   VENTRICULAR ANEURYSM RESECTION N/A 01/28/2013   Procedure: LEFT VENTRICULAR ANEURYSM REPAIR;  Surgeon: Melrose Nakayama, MD;  Location: [place];  Service: Open Heart Surgery;  Laterality: N/A;    Current Medications: Current Meds  Medication Sig   ALPHAGAN P 0.1 % SOLN INSTILL 1 DROP  INTO BOTH EYES TWICE A DAY (Patient taking differently: Place 1 drop into both eyes 2 (two) times daily.)   aspirin 81 MG EC tablet Take 1 tablet (81 mg total) by mouth daily. Restart on 07/05/2018 (Patient taking differently: Take 81 mg by mouth daily.)   atorvastatin (LIPITOR) 80 MG tablet TAKE 1 TABLET BY MOUTH EVERY DAY (Patient taking differently: Take 80 mg by mouth daily.)   Cholecalciferol (VITAMIN D) 125 MCG (5000 UT) CAPS Take 5,000 Units by mouth daily.   dapagliflozin propanediol (FARXIGA) 10 MG TABS tablet Take 1 tablet (10 mg total) by mouth daily.   ENTRESTO 97-103 MG TAKE 1 TABLET BY MOUTH TWICE A DAY (Patient taking differently: Take 1 tablet by mouth 2 (two) times daily.)   furosemide (LASIX) 20 MG tablet TAKE 1 TABLET BY MOUTH EVERY DAY (Patient taking differently: Take 20 mg by mouth daily.)   gabapentin (NEURONTIN) 300 MG capsule TAKE 1 CAPSULE BY MOUTH EVERYDAY AT BEDTIME (Patient taking differently: Take 300 mg by mouth daily.)   latanoprost (XALATAN) 0.005 % ophthalmic solution Place 1 drop into both eyes at bedtime.   methocarbamol (ROBAXIN) 500 MG tablet TAKE 1 TABLET (500 MG TOTAL) BY MOUTH 2 (TWO) TIMES DAILY AS NEEDED FOR MUSCLE SPASMS.   Multiple Vitamin (MULTIVITAMIN WITH MINERALS) TABS tablet Take 1 tablet by mouth daily. Centrum Silver   nicotine (NICODERM CQ - DOSED IN MG/24 HOURS) 21 mg/24hr patch Place 1 patch (21 mg total) onto the skin daily.   nitroGLYCERIN (NITROSTAT) 0.4 MG SL tablet Place 1 tablet (0.4 mg total) under the tongue every 5 (five) minutes x 3 doses as needed for chest pain.   traMADol (ULTRAM) 50 MG tablet Take 1 tablet (50 mg total) by mouth every 8 (eight) hours as needed. (Patient taking differently: Take 50 mg by mouth every 8 (eight) hours as needed for moderate pain.)     Allergies:   Patient has no known allergies.   Social History   Socioeconomic History   Marital status: Single    Spouse name: Not on file   Number of children: 2    Years of education: 12th   Highest education level: Not on file  Occupational History   Occupation: N/A  Tobacco Use   Smoking status: Every Day    Packs/day: 1.00    Years: 35.00    Pack years: 35.00    Types: Cigarettes   Smokeless tobacco: Never  Vaping Use   Vaping Use: Never used  Substance and Sexual Activity   Alcohol use: No    Alcohol/week: 0.0 standard drinks   Drug use: No   Sexual activity: Yes  Other Topics Concern   Not on file  Social History Narrative   Lives in [place] with wife.  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  He is  now retired on disability.  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      Caffeine Use: very rarely   Social Determinants of Radio broadcast assistant Strain: Not on file  Food Insecurity: Not on file  Transportation Needs: Not on file  Physical Activity: Not on file  Stress: Not on file  Social Connections: Not on file     Family History: The patient's family history includes CAD in an other family member; Diabetes in his mother; HIV in his brother; Heart attack in his mother; Hypertension in his father and mother. There is no history of Stroke.  ROS:   Please see the history of present illness.     All other systems reviewed and are negative.  EKGs/Labs/Other Studies Reviewed:    The following studies were reviewed today:  Echo 09/11/2020 1. Left ventricular ejection fraction, by estimation, is 25 to 30%. The  left ventricle has severely decreased function. The left ventricle  demonstrates regional wall motion abnormalities (see scoring  diagram/findings for description). Left ventricular  diastolic parameters are consistent with Grade II diastolic dysfunction  (pseudonormalization). Elevated left ventricular end-diastolic pressure.  There is of the left ventricular, . There is of the left ventricular,.   2. Right ventricular systolic function was not well visualized. The right  ventricular size is normal.  Tricuspid regurgitation signal is inadequate  for assessing PA pressure.   3. The mitral valve is grossly normal. Trivial mitral valve  regurgitation.   4. The aortic valve is tricuspid. Aortic valve regurgitation is not  visualized. No aortic stenosis is present.   5. The inferior vena cava is normal in size with <50% respiratory  variability, suggesting right atrial pressure of 8 mmHg.   Comparison(s): No significant change from prior study.     Cath 09/13/2020 Prox LAD lesion is 40% stenosed. Prox LAD to Mid LAD lesion is 10% stenosed. Prox Cx to Mid Cx lesion is 20% stenosed. Ost Cx to Prox Cx lesion is 50% stenosed. Prox RCA to Mid RCA lesion is 100% stenosed. SVG graft was not visualized due to known occlusion. Origin to Prox Graft lesion is 100% stenosed. LV end diastolic pressure is normal.   1. Single vessel occlusive CAD with CTO of the mid RCA. 2. Patent stents in the mid LAD and LCx. 3. No SVG to OM visualized but is felt to be occluded. 4. Low LVEDP 3 mm Hg   Plan: recommend continued medical management.  EKG:  EKG is not ordered today.    Recent Labs: 02/29/2020: ALT 15 11/30/2020: B Natriuretic Peptide 144.6; BUN 14; Creatinine, Ser 1.07; Hemoglobin 15.7; Platelets 164; Potassium 4.0; Sodium 138  Recent Lipid Panel    Component Value Date/Time   CHOL 162 02/29/2020 1642   TRIG 110 02/29/2020 1642   HDL 57 02/29/2020 1642   CHOLHDL 2.8 02/29/2020 1642   CHOLHDL 2.5 11/03/2015 1609   VLDL 19 11/03/2015 1609   LDLCALC 85 02/29/2020 1642     Risk Assessment/Calculations:           Physical Exam:    VS:  BP 132/74   Pulse 60   Ht _0  (1.854 m)   Wt 257 lb (116.6 kg)   SpO2 95%   BMI 33.91 kg/m     Wt Readings from Last 3 Encounters:  01/31/21 257 lb (116.6 kg)  11/30/20 265 lb (120.2 kg)  10/05/20 261 lb (118.4 kg)     GEN:  Well nourished,  well developed in no acute distress HEENT: Normal NECK: No JVD; No carotid bruits LYMPHATICS: No  lymphadenopathy CARDIAC: RRR, no murmurs, rubs, gallops RESPIRATORY:  Clear to auscultation without rales, wheezing or rhonchi  ABDOMEN: Soft, non-tender, non-distended MUSCULOSKELETAL:  No edema; No deformity  SKIN: Warm and dry NEUROLOGIC:  Alert and oriented x 3 PSYCHIATRIC:  Normal affect   ASSESSMENT:    1. Chronic systolic CHF (congestive heart failure) ([place])   2. ICD (implantable cardioverter-defibrillator) in place   3. Coronary artery disease involving coronary bypass graft of native heart without angina pectoris   4. Essential hypertension     PLAN:    In order of problems listed above:  CAD s/p CABG: Recent cardiac catheterization performed on 09/13/2020 showed stable coronary anatomy.  Continue medical therapy.  Hyperlipidemia: On Lipitor  Ischemic cardiomyopathy s/p ICD: recent fluid accumulation by Corevue. Recommend increasing lasix to 40 mg daily for 4 days then 20 mg daily. Continue Coreg, Entresto, aldactone and [place]. Low sodium diet reenforced.   Hypertension: Blood pressure stable  Prediabetes: Managed by primary care provider  History of CVA: No recurrence.  7.   Tobacco abuse - encourage complete cessation.      Follow up 3 months with APP  Medication Adjustments/Labs and Tests Ordered: Current medicines are reviewed at length with the patient today.  Concerns regarding medicines are outlined above.  No orders of the defined types were placed in this encounter.  No orders of the defined types were placed in this encounter.   Patient Instructions  Increase lasix to 40 mg daily for 4 days then reduce back to 20 mg daily  Reduce salt intake  Quit smoking completely.    Signed, Cataleyah Colborn Martinique, MD  01/31/2021 3:03 PM    [facility]

## 2021-01-31 ENCOUNTER — Other Ambulatory Visit: Payer: Self-pay

## 2021-01-31 ENCOUNTER — Ambulatory Visit (INDEPENDENT_AMBULATORY_CARE_PROVIDER_SITE_OTHER): Payer: Medicare HMO | Admitting: Cardiology

## 2021-01-31 ENCOUNTER — Encounter: Payer: Self-pay | Admitting: Cardiology

## 2021-01-31 ENCOUNTER — Ambulatory Visit (INDEPENDENT_AMBULATORY_CARE_PROVIDER_SITE_OTHER): Payer: Medicare HMO

## 2021-01-31 VITALS — BP 132/74 | HR 60 | Ht 73.0 in | Wt 257.0 lb

## 2021-01-31 DIAGNOSIS — Z95810 Presence of automatic (implantable) cardiac defibrillator: Secondary | ICD-10-CM | POA: Diagnosis not present

## 2021-01-31 DIAGNOSIS — I5022 Chronic systolic (congestive) heart failure: Secondary | ICD-10-CM | POA: Diagnosis not present

## 2021-01-31 DIAGNOSIS — I25810 Atherosclerosis of coronary artery bypass graft(s) without angina pectoris: Secondary | ICD-10-CM

## 2021-01-31 DIAGNOSIS — I10 Essential (primary) hypertension: Secondary | ICD-10-CM

## 2021-01-31 NOTE — Progress Notes (Signed)
EPIC Encounter for ICM Monitoring  Patient Name: [name] is a 60 y.o. male Date: 01/31/2021 Primary Care Physican: Ladell Pier, MD Primary Cardiologist: Martinique Electrophysiologist: Allred 01/23/2021 Weight: 263 lbs                                                            Spoke with patient and heart failure questions reviewed.  Pt asymptomatic for fluid accumulation and feeling well.   Discussed diet and he is eating foods such as deli meats, cheeses, crackers and unaware they are typically high in salt.    CorVue thoracic impedance suggesting possible ongoing fluid accumulation starting 10/5 (12 days).  Also suggesting fluid accumulation from 9/22-10/2 (10 days)   Prescribed: Furosemide 20 mg take 1 tablet by mouth daily. Spironolactone 25 mg take 1 tablet daily   Labs: 11/30/2020 Creatinine 1.07, BUN 14, Potassium 4.0, Sodium 138, GFR >60 09/10/2020 Creatinine 1.18, BUN 14, Potassium 4.3, Sodium 141, GFR >60  A complete set of results can be found in Results Review.  Recommendations: Any recommendations will be given at OV with Dr Martinique, 01/31/21.  Advised to review food labels and limit salt intake to 2000 mg daily.   Follow-up plan: ICM clinic phone appointment on 02/08/2021 to recheck fluid levels.   91 day device clinic remote transmission 02/22/2021.     EP/Cardiology Office Visits: 01/31/2021 with Dr. Martinique.  08/28/2021 with Dr Rayann Heman.   Copy of ICM check sent to Dr. Rayann Heman and Dr Martinique as [place] since patient has OV today, 01/31/2021.   3 month ICM trend: 01/31/2021.    1 Year ICM trend:       Rosalene Billings, RN 01/31/2021 7:44 AM

## 2021-01-31 NOTE — Patient Instructions (Signed)
Increase lasix to 40 mg daily for 4 days then reduce back to 20 mg daily  Reduce salt intake  Quit smoking completely.

## 2021-02-01 ENCOUNTER — Telehealth: Payer: Self-pay | Admitting: Cardiology

## 2021-02-01 DIAGNOSIS — I255 Ischemic cardiomyopathy: Secondary | ICD-10-CM

## 2021-02-01 MED ORDER — FUROSEMIDE 20 MG PO TABS: 20.0000 mg | ORAL_TABLET | Freq: Every day | ORAL | 3 refills | Status: DC

## 2021-02-01 NOTE — Telephone Encounter (Signed)
Refills has been sent to the pharmacy. 

## 2021-02-01 NOTE — Telephone Encounter (Signed)
From 01/31/21 note:  Ischemic cardiomyopathy s/p ICD: recent fluid accumulation by Corevue. Recommend increasing lasix to 40 mg daily for 4 days then 20 mg daily  Spoke with the pt and he took 20 mg today only and wanted to be sure that we knew he was not taking his Lasix at all prior to his appt since his note says to "increase" his Lasix.Marland KitchenMarland Kitchen I will forward to Dr. Cheri Kearns for review and pt will wait to hear back from [place] possibly not until tomorrow.

## 2021-02-01 NOTE — Telephone Encounter (Signed)
Pt c/o medication issue:  1. Name of Medication: furosemide (LASIX) 20 MG tablet  2. How are you currently taking this medication (dosage and times per day)?   3. Are you having a reaction (difficulty breathing--STAT)?   4. What is your medication issue? Patient wanted to know if he needs to double up on his lasix for 4 days or just take one pill to catch up

## 2021-02-01 NOTE — Telephone Encounter (Signed)
*  STAT* If patient is at the pharmacy, call can be transferred to refill team.   1. Which medications need to be refilled? (please list name of each medication and dose if known)  furosemide (LASIX) 20 MG tablet  2. Which pharmacy/location (including street and city if local pharmacy) is medication to be sent to? [id number] - [place], [place] - [address] AT [place]  3. Do they need a 30 day or 90 day supply? 90 with refills   Patient is out of medication

## 2021-02-02 NOTE — Telephone Encounter (Signed)
I would still take lasix 40 mg daily for 4 days then reduce to 20 mg daily - need to catch back up  Alease Fait Martinique MD, [facility]

## 2021-02-03 NOTE — Telephone Encounter (Signed)
Spoke to patient Dr.Jordan's advice given.He will start taking Lasix 40 mg daily for 4 days only then start 20 mg daily.

## 2021-02-08 ENCOUNTER — Ambulatory Visit: Payer: Medicare HMO

## 2021-02-08 DIAGNOSIS — I5022 Chronic systolic (congestive) heart failure: Secondary | ICD-10-CM

## 2021-02-08 DIAGNOSIS — Z95810 Presence of automatic (implantable) cardiac defibrillator: Secondary | ICD-10-CM

## 2021-02-08 NOTE — Progress Notes (Signed)
EPIC Encounter for ICM Monitoring  Patient Name: [name] is a 60 y.o. male Date: 02/08/2021 Primary Care Physican: Ladell Pier, MD Primary Cardiologist: Martinique Electrophysiologist: Allred 01/23/2021 Weight: 263 lbs                                                            Spoke with patient and heart failure questions reviewed.  Pt asymptomatic for fluid accumulation and feeling well.   CorVue thoracic impedance suggesting fluid levels returned to normal after taking Lasix 40 mg x 4 days as instructed at 10/18 OV.   Prescribed: Furosemide 20 mg take 1 tablet by mouth daily. Spironolactone 25 mg take 1 tablet daily   Labs: 11/30/2020 Creatinine 1.07, BUN 14, Potassium 4.0, Sodium 138, GFR >60 09/10/2020 Creatinine 1.18, BUN 14, Potassium 4.3, Sodium 141, GFR >60  A complete set of results can be found in Results Review.   Recommendations:  No changes and encouraged to call if experiencing any fluid symptoms.   Follow-up plan: ICM clinic phone appointment on 02/27/2021.   91 day device clinic remote transmission 02/22/2021.     EP/Cardiology Office Visits: 05/02/2021 with Almyra Deforest, PA.  08/28/2021 with Dr Rayann Heman.   Copy of ICM check sent to Dr. Rayann Heman.    3 month ICM trend: 02/08/2021.    1 Year ICM trend:       Rosalene Billings, RN 02/08/2021 7:47 AM

## 2021-02-09 ENCOUNTER — Ambulatory Visit: Payer: Medicare HMO | Attending: Internal Medicine | Admitting: Internal Medicine

## 2021-02-09 ENCOUNTER — Other Ambulatory Visit: Payer: Self-pay

## 2021-02-09 ENCOUNTER — Encounter: Payer: Self-pay | Admitting: Internal Medicine

## 2021-02-09 ENCOUNTER — Ambulatory Visit: Payer: Medicare HMO | Admitting: Pharmacist

## 2021-02-09 VITALS — BP 107/71 | HR 82 | Resp 16 | Wt 251.2 lb

## 2021-02-09 DIAGNOSIS — I25810 Atherosclerosis of coronary artery bypass graft(s) without angina pectoris: Secondary | ICD-10-CM

## 2021-02-09 DIAGNOSIS — R7303 Prediabetes: Secondary | ICD-10-CM

## 2021-02-09 DIAGNOSIS — Z23 Encounter for immunization: Secondary | ICD-10-CM | POA: Diagnosis not present

## 2021-02-09 DIAGNOSIS — E669 Obesity, unspecified: Secondary | ICD-10-CM

## 2021-02-09 DIAGNOSIS — Z2821 Immunization not carried out because of patient refusal: Secondary | ICD-10-CM

## 2021-02-09 DIAGNOSIS — I10 Essential (primary) hypertension: Secondary | ICD-10-CM

## 2021-02-09 DIAGNOSIS — F17200 Nicotine dependence, unspecified, uncomplicated: Secondary | ICD-10-CM | POA: Diagnosis not present

## 2021-02-09 MED ORDER — NICOTINE 7 MG/24HR TD PT24: 7.0000 mg | MEDICATED_PATCH | Freq: Every day | TRANSDERMAL | 1 refills | Status: DC

## 2021-02-09 MED ORDER — ZOSTER VAC RECOMB ADJUVANTED 50 MCG/0.5ML IM SUSR: 0.5000 mL | Freq: Once | INTRAMUSCULAR | 0 refills | Status: AC

## 2021-02-09 NOTE — Progress Notes (Signed)
Patient ID: [name], male    DOB: [birth year]  MRN: [id number]  CC: Hypertension   Subjective: [name] is a 60 y.o. 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  HYPERTENSION/CAD/CHF Currently taking: see medication list.  He is on aspirin, atorvastatin, carvedilol, Farxiga, Entresto, furosemide, spironolactone.  He was recently seen by [name] and furosemide was increased to 40 mg for 4 days then he went back to 20 mg daily. Med Adherence: [x]  Yes    []  No Medication side effects: []  Yes    [x]  No Adherence with salt restriction: [x]  Yes    []  No Home Monitoring?: []  Yes    [x]  No but does have a device Monitoring Frequency:  Home BP results range:  SOB? []  Yes    [x]  No, no PND.  Lasix was increased for 4 days to 40 mg after he saw Dr. Neita Tucker 01/31/21 Chest Pain?: []  Yes    [x]  No Leg swelling?: []  Yes    [x]  No Headaches?: []  Yes    [x]  No Dizziness? [x]  Yes  -earlier today when he stood up too fast.  Eating and drinking okay.  He has not eaten a meal as yet for today Comments: no firing of ICD  Obesity/PreDM:  wgh down 6 lbs since he saw [name] 01/31/21. Feels he can do better with eating habits Not doing as much exercising because of chronic back pain with RT sided radiculopathy.  Request nutrition referral. Has Silver [employer]neakers and has membership at [employer] but has not been going.  No pool at [place] dep:  down to 4 cigarettes a day.  Working on trying to quit.  Was at 2 pks/day.    HM:  Declines flu shot and Pneumonia vaccine.  Due for 2nd shingles vaccine Patient Active Problem List   Diagnosis Date Noted   Unstable angina ([place])    Hx of CABG 07/09/2019   Lumbar radiculopathy 12/04/2018   Glaucoma 12/04/2018    Cortical age-related cataract of both eyes 12/04/2018   Pain management contract agreement 12/04/2018   Lumbar herniated disc 06/04/2018   Prediabetes 01/01/2018   Hyperlipidemia 12/30/2017   Obesity (BMI 30.0-34.9) 12/30/2017   Benign neoplasm of descending colon    Essential hypertension 11/03/2015   Cardiomyopathy, ischemic 55/37/4827   Chronic systolic dysfunction of left ventricle 06/03/2015   Erectile dysfunction due to arterial insufficiency 12/23/2014   Lipoma of skin and subcutaneous tissue of neck 05/24/2014   History of cardioembolic stroke 07/86/7544   Vision loss of right eye 06/23/2013   Dyslipidemia 06/22/2013   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 Restart on 07/05/2018 (Patient taking differently: Take 81 mg by mouth daily.) 90 tablet 3   atorvastatin (LIPITOR) 80 MG tablet TAKE 1  TABLET BY MOUTH EVERY DAY (Patient taking differently: Take 80 mg by mouth daily.) 90 tablet 3   carvedilol (COREG) 25 MG tablet TAKE 1 TABLET BY MOUTH TWICE A DAY WITH A MEAL (Patient taking differently: Take 25 mg by mouth 2 (two) times daily with a meal.) 180 tablet 3   Cholecalciferol (VITAMIN D) 125 MCG (5000 UT) CAPS Take 5,000 Units by mouth daily.     dapagliflozin propanediol (FARXIGA) 10 MG TABS tablet Take 1 tablet (10 mg total) by mouth daily. 90 tablet 3   ENTRESTO 97-103 MG TAKE 1 TABLET BY MOUTH TWICE A DAY (Patient taking differently: Take 1 tablet by mouth 2 (two) times daily.) 180 tablet 3   furosemide (LASIX) 20 MG tablet Take 1 tablet (20 mg total) by mouth daily.  90 tablet 3   gabapentin (NEURONTIN) 300 MG capsule TAKE 1 CAPSULE BY MOUTH EVERYDAY AT BEDTIME (Patient taking differently: Take 300 mg by mouth daily.) 90 capsule 1   latanoprost (XALATAN) 0.005 % ophthalmic solution Place 1 drop into both eyes at bedtime.  12   methocarbamol (ROBAXIN) 500 MG tablet TAKE 1 TABLET (500 MG TOTAL) BY MOUTH 2 (TWO) TIMES DAILY AS NEEDED FOR MUSCLE SPASMS. 30 tablet 2   Multiple Vitamin (MULTIVITAMIN WITH MINERALS) TABS tablet Take 1 tablet by mouth daily. Centrum Silver     nitroGLYCERIN (NITROSTAT) 0.4 MG SL tablet Place 1 tablet (0.4 mg total) under the tongue every 5 (five) minutes x 3 doses as needed for chest pain. 25 tablet 1   spironolactone (ALDACTONE) 25 MG tablet Take 1 tablet (25 mg total) by mouth daily. 90 tablet 3   traMADol (ULTRAM) 50 MG tablet Take 1 tablet (50 mg total) by mouth every 8 (eight) hours as needed. (Patient taking differently: Take 50 mg by mouth every 8 (eight) hours as needed for moderate pain.) 30 tablet 0   No current facility-administered medications on file prior to visit.    No Known Allergies  Social History   Socioeconomic History   Marital status: Single    Spouse name: Not on file   Number of children: 2   Years of education: 12th   Highest education level: Not on file  Occupational History   Occupation: N/A  Tobacco Use   Smoking status: Every Day    Packs/day: 1.00    Years: 35.00    Pack years: 35.00    Types: Cigarettes   Smokeless tobacco: Never  Vaping Use   Vaping Use: Never used  Substance and Sexual Activity   Alcohol use: No    Alcohol/week: 0.0 standard drinks   Drug use: No   Sexual activity: Yes  Other Topics Concern   Not on file  Social History Narrative   Lives in [place] with wife.  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  He is now retired on disability.  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      Caffeine Use: very rarely   Social Determinants of Adult nurse Strain: Not on file  Food Insecurity: Not on file  Transportation Needs: Not on file  Physical Activity: Not on file  Stress: Not on file  Social Connections: Not on file  Intimate Partner Violence: Not on file    Family History  Problem Relation Age of Onset   Heart attack Mother    Diabetes Mother    Hypertension Mother    Hypertension Father    CAD  Other    HIV Brother    Stroke Neg Hx     Past Surgical History:  Procedure Laterality Date   CARDIAC CATHETERIZATION  01/06/2013   COLONOSCOPY WITH PROPOFOL N/A 07/03/2016   Procedure: COLONOSCOPY WITH PROPOFOL;  Surgeon: Manus Gunning, MD;  Location: [facility];  Service: Gastroenterology;  Laterality: N/A;   CORONARY ANGIOPLASTY WITH STENT PLACEMENT  2000's X 2   "1 + 1" (01/06/2013)   CORONARY ARTERY BYPASS GRAFT N/A 01/28/2013   Procedure: CORONARY ARTERY BYPASS GRAFTING (CABG);  Surgeon: Melrose Nakayama, MD;  Location: [place];  Service: Open Heart Surgery;  Laterality: N/A;  CABG x one, using left greater saphenous vein harvested endoscopically   ICD IMPLANT N/A 05/25/2020   Procedure: ICD IMPLANT;  Surgeon: Thompson Grayer, MD;  Location: [place] CV LAB;  Service: Cardiovascular;  Laterality: N/A;   INTRAOPERATIVE TRANSESOPHAGEAL ECHOCARDIOGRAM N/A 01/28/2013   Procedure: INTRAOPERATIVE TRANSESOPHAGEAL ECHOCARDIOGRAM;  Surgeon: Melrose Nakayama, MD;  Location: [place];  Service: Open Heart Surgery;  Laterality: N/A;   LEFT HEART CATH AND CORS/GRAFTS ANGIOGRAPHY N/A 09/13/2020   Procedure: LEFT HEART CATH AND CORS/GRAFTS ANGIOGRAPHY;  Surgeon: Tucker, Peter M, MD;  Location: [place] [facility];  Service: Cardiovascular;  Laterality: N/A;   LEFT HEART CATHETERIZATION WITH CORONARY ANGIOGRAM N/A 01/06/2013   Procedure: LEFT HEART CATHETERIZATION WITH CORONARY ANGIOGRAM;  Surgeon: Peter M Martinique, MD;  Location: [facility] CATH LAB;  Service: Cardiovascular;  Laterality: N/A;   LEFT HEART CATHETERIZATION  WITH CORONARY ANGIOGRAM N/A 01/21/2013   Procedure: LEFT HEART CATHETERIZATION WITH CORONARY ANGIOGRAM;  Surgeon: Burnell Blanks, MD;  Location: [facility] CATH LAB;  Service: Cardiovascular;  Laterality: N/A;   LUMBAR LAMINECTOMY/ DECOMPRESSION WITH MET-RX Right 06/30/2018   Procedure: Right minimally invasive Lumbar Three-Four Far lateral discectomy;  Surgeon: Judith Part, MD;  Location: [place];  Service: Neurosurgery;  Laterality: Right;  Right minimally invasive Lumbar Three-Four Far lateral discectomy   MASS EXCISION Right 07/21/2014   Procedure: EXCISION RIGHT NECK MASS;  Surgeon: Erroll Luna, MD;  Location: [place];  Service: General;  Laterality: Right;   PERICARDIAL TAP N/A 01/24/2013   Procedure: PERICARDIAL TAP;  Surgeon: Blane Ohara, MD;  Location: [facility] CATH LAB;  Service: Cardiovascular;  Laterality: N/A;   RIGHT HEART CATHETERIZATION Right 01/24/2013   Procedure: RIGHT HEART CATH;  Surgeon: Blane Ohara, MD;  Location: [facility] CATH LAB;  Service: Cardiovascular;  Laterality: Right;   VENTRICULAR ANEURYSM RESECTION N/A 01/28/2013   Procedure: LEFT VENTRICULAR ANEURYSM REPAIR;  Surgeon: Melrose Nakayama, MD;  Location: [place];  Service: Open Heart Surgery;  Laterality: N/A;    ROS: Review of Systems Negative except as stated above  PHYSICAL EXAM: BP 107/71   Pulse 82   Resp 16   Wt 251 lb 3.2 oz (113.9 kg)   SpO2 96%   BMI 33.14 kg/m   Wt Readings from Last 3 Encounters:  02/09/21 251 lb 3.2 oz (113.9 kg)  01/31/21 257 lb (116.6 kg)  11/30/20 265 lb (120.2 kg)    Physical Exam   General appearance - alert, well appearing, older African-American male and in no distress.  He has a cane with him but was able to walk and get up on the table without having to use his cane. Mental status - normal mood, behavior, speech, dress, motor activity, and thought processes Neck - supple, no significant adenopathy Chest - clear to auscultation, no  wheezes, rales or rhonchi, symmetric  air entry Heart - normal rate, regular rhythm, normal S1, S2, no murmurs, rubs, clicks or gallops Extremities - peripheral pulses normal, no pedal edema, no clubbing or cyanosis  CMP Latest Ref Rng & Units 11/30/2020 09/10/2020 05/06/2020  Glucose 70 - 99 mg/dL 116(H) 99 91  BUN 6 - 20 mg/dL 14 14 10   Creatinine 0.61 - 1.24 mg/dL 1.07 1.18 1.03  Sodium 135 - 145 mmol/L 138 141 145(H)  Potassium 3.5 - 5.1 mmol/L 4.0 4.3 4.6  Chloride 98 - 111 mmol/L 109 105 109(H)  CO2 22 - 32 mmol/L 21(L) 26 24  Calcium 8.9 - 10.3 mg/dL 9.3 9.6 9.3  Total Protein 6.0 - 8.5 g/dL - - -  Total Bilirubin 0.0 - 1.2 mg/dL - - -  Alkaline Phos 44 - 121 IU/L - - -  AST 0 - 40 IU/L - - -  ALT 0 - 44 IU/L - - -   Lipid Panel     Component Value Date/Time   CHOL 162 02/29/2020 1642   TRIG 110 02/29/2020 1642   HDL 57 02/29/2020 1642   CHOLHDL 2.8 02/29/2020 1642   CHOLHDL 2.5 11/03/2015 1609   VLDL 19 11/03/2015 1609   LDLCALC 85 02/29/2020 1642    CBC    Component Value Date/Time   WBC 4.8 11/30/2020 1432   RBC 5.25 11/30/2020 1432   HGB 15.7 11/30/2020 1432   HGB 15.5 10/28/2020 1427   HCT 46.4 11/30/2020 1432   HCT 45.2 10/28/2020 1427   PLT 164 11/30/2020 1432   PLT 184 10/28/2020 1427   MCV 88.4 11/30/2020 1432   MCV 86 10/28/2020 1427   MCH 29.9 11/30/2020 1432   MCHC 33.8 11/30/2020 1432   RDW 13.0 11/30/2020 1432   RDW 13.5 10/28/2020 1427   LYMPHSABS 2.1 11/30/2020 1432   LYMPHSABS 2.9 05/06/2020 1342   MONOABS 0.6 11/30/2020 1432   EOSABS 0.1 11/30/2020 1432   EOSABS 0.1 05/06/2020 1342   BASOSABS 0.0 11/30/2020 1432   BASOSABS 0.1 05/06/2020 1342    ASSESSMENT AND PLAN:  1. Essential hypertension At goal.  Continue current medications and low-salt diet  2. Obesity (BMI 30.0-34.9) Commended him on weight loss but some of it may have been water weight.  Discussed and encourage healthy eating habits.  Referral submitted to nutritionist.  He  will find out whether his silver sneakers will cover for him to go to the [employer] to do water aerobics - Amb ref to Medical Nutrition Therapy-MNT  3. Prediabetes See #2 above  4. Tobacco dependence Commended him on cutting back.  Encouraged to set a quit date.  He is wanting to try the nicotine patches.  We have placed him on the 7 mg patch - nicotine (NICODERM CQ - DOSED IN MG/24 HR) 7 mg/24hr patch; Place 1 patch (7 mg total) onto the skin daily.  Dispense: 28 patch; Refill: 1  5. Coronary artery disease involving coronary bypass graft of native heart without angina pectoris Stable.  Continue beta-blocker, atorvastatin and aspirin  6. Need for shingles vaccine He is due for his second shingles vaccine.  We have printed a prescription for him to take to an outside pharmacy to get second Shingrix shot.  7. Influenza vaccination declined Recommended.  Patient declined.  8. Pneumococcal vaccination declined   Patient was given the opportunity to ask questions.  Patient verbalized understanding of the plan and was able to repeat key elements of the plan.   Orders Placed This Encounter  Procedures   Amb ref to Medical Nutrition Therapy-MNT     Requested Prescriptions   Signed Prescriptions Disp Refills   Zoster Vaccine Adjuvanted [facility]) injection 0.5 mL 0    Sig: Inject 0.5 mLs into the muscle once for 1 dose.   nicotine (NICODERM CQ - DOSED IN MG/24 HR) 7 mg/24hr patch 28 patch 1    Sig: Place 1 patch (7 mg total) onto the skin daily.    Return in about 4 months (around 06/12/2021).  Karle Plumber, MD, FACP

## 2021-02-10 ENCOUNTER — Ambulatory Visit: Payer: Medicare HMO | Admitting: Pharmacist

## 2021-02-15 ENCOUNTER — Ambulatory Visit: Payer: Medicare HMO | Admitting: Registered"

## 2021-02-20 ENCOUNTER — Other Ambulatory Visit: Payer: Self-pay | Admitting: Internal Medicine

## 2021-02-20 DIAGNOSIS — M5416 Radiculopathy, lumbar region: Secondary | ICD-10-CM

## 2021-02-20 NOTE — Telephone Encounter (Signed)
Pt is calling to check on the status of his medication refill for 90 days.  Please advise CB- [phone]

## 2021-02-20 NOTE — Telephone Encounter (Signed)
Requested Prescriptions  Pending Prescriptions Disp Refills  . gabapentin (NEURONTIN) 300 MG capsule [Pharmacy Med Name: GABAPENTIN 300 MG CAPSULE] 90 capsule 0    Sig: TAKE 1 CAPSULE BY MOUTH EVERYDAY AT BEDTIME     Neurology: Anticonvulsants - gabapentin Passed - 02/20/2021 10:04 AM      Passed - Valid encounter within last 12 months    Recent Outpatient Visits          1 week ago Essential hypertension   [place], MD   3 months ago Need for shingles vaccine   [place], Jarome Matin, RPH-CPP   4 months ago Ischemic cardiomyopathy   [place], MD   7 months ago Lumbar radiculopathy   [place], Deborah B, MD   11 months ago Essential hypertension   [place], Deborah B, MD      Future Appointments            In 2 months [place], [place], [place] [facility] [place], [place]   In 3 months Wynetta Emery, Dalbert Batman, MD [place]

## 2021-02-21 LAB — CUP PACEART REMOTE DEVICE CHECK
Battery Remaining Longevity: 113 mo
Battery Remaining Percentage: 91 %
Battery Voltage: 3.01 V
Brady Statistic RV Percent Paced: 0 %
Date Time Interrogation Session: 20221108010018
HighPow Impedance: 64 Ohm
Implantable Lead Implant Date: 20220209
Implantable Lead Location: 753860
Implantable Pulse Generator Implant Date: 20220209
Lead Channel Impedance Value: 400 Ohm
Lead Channel Pacing Threshold Amplitude: 2.25 V
Lead Channel Pacing Threshold Pulse Width: 0.5 ms
Lead Channel Sensing Intrinsic Amplitude: 6.7 mV
Lead Channel Setting Pacing Amplitude: 2.5 V
Lead Channel Setting Pacing Pulse Width: 0.5 ms
Lead Channel Setting Sensing Sensitivity: 0.5 mV
Pulse Gen Serial Number: 111033765

## 2021-02-22 ENCOUNTER — Ambulatory Visit (INDEPENDENT_AMBULATORY_CARE_PROVIDER_SITE_OTHER): Payer: Medicare HMO

## 2021-02-22 DIAGNOSIS — I5022 Chronic systolic (congestive) heart failure: Secondary | ICD-10-CM | POA: Diagnosis not present

## 2021-02-22 DIAGNOSIS — I255 Ischemic cardiomyopathy: Secondary | ICD-10-CM

## 2021-02-27 ENCOUNTER — Other Ambulatory Visit: Payer: Self-pay | Admitting: Internal Medicine

## 2021-02-27 ENCOUNTER — Ambulatory Visit (INDEPENDENT_AMBULATORY_CARE_PROVIDER_SITE_OTHER): Payer: Medicare HMO

## 2021-02-27 ENCOUNTER — Telehealth: Payer: Self-pay

## 2021-02-27 ENCOUNTER — Ambulatory Visit: Payer: Self-pay | Admitting: *Deleted

## 2021-02-27 DIAGNOSIS — I5022 Chronic systolic (congestive) heart failure: Secondary | ICD-10-CM | POA: Diagnosis not present

## 2021-02-27 DIAGNOSIS — Z95810 Presence of automatic (implantable) cardiac defibrillator: Secondary | ICD-10-CM | POA: Diagnosis not present

## 2021-02-27 NOTE — Telephone Encounter (Signed)
Reason for Disposition  MILD rectal bleeding (more than just a few drops or streaks)  Answer Assessment - Initial Assessment Questions 1. APPEARANCE of BLOOD: "What color is it?" "Is it passed separately, on the surface of the stool, or mixed in with the stool?"      Bright red- separate from stool 2. AMOUNT: "How much blood was passed?"      Not a lot 3. FREQUENCY: "How many times has blood been passed with the stools?"      twice 4. ONSET: "When was the blood first seen in the stools?" (Days or weeks)      2 weeks ago 5. DIARRHEA: "Is there also some diarrhea?" If Yes, ask: "How many diarrhea stools in the past 24 hours?"      no 6. CONSTIPATION: "Do you have constipation?" If Yes, ask: "How bad is it?"     Sometimes- did strain to have BM 7. RECURRENT SYMPTOMS: "Have you had blood in your stools before?" If Yes, ask: "When was the last time?" and "What happened that time?"      Once- colonoscopy 8. BLOOD THINNERS: "Do you take any blood thinners?" (e.g., Coumadin/warfarin, Pradaxa/dabigatran, aspirin)     Aspirin 81 mg 9. OTHER SYMPTOMS: "Do you have any other symptoms?"  (e.g., abdomen pain, vomiting, dizziness, fever)     no 10. PREGNANCY: "Is there any chance you are pregnant?" "When was your last menstrual period?"       na  Protocols used: Rectal Bleeding-A-AH

## 2021-02-27 NOTE — Telephone Encounter (Signed)
Requested Prescriptions  Pending Prescriptions Disp Refills  . spironolactone (ALDACTONE) 25 MG tablet [Pharmacy Med Name: SPIRONOLACTONE 25 MG Tablet] 90 tablet 1    Sig: TAKE 1 TABLET EVERY DAY     Cardiovascular: Diuretics - Aldosterone Antagonist Passed - 02/27/2021  3:19 AM      Passed - Cr in normal range and within 360 days    Creatinine  Date Value Ref Range Status  12/04/2018 139.0 20.0 - 300.0 mg/dL Final   Creat  Date Value Ref Range Status  11/03/2015 0.90 0.70 - 1.33 mg/dL Final    Comment:      For patients > or = 59 years of age: The upper reference limit for Creatinine is approximately 13% higher for people identified as African-American.      Creatinine, Ser  Date Value Ref Range Status  11/30/2020 1.07 0.61 - 1.24 mg/dL Final         Passed - K in normal range and within 360 days    Potassium  Date Value Ref Range Status  11/30/2020 4.0 3.5 - 5.1 mmol/L Final         Passed - Na in normal range and within 360 days    Sodium  Date Value Ref Range Status  11/30/2020 138 135 - 145 mmol/L Final  05/06/2020 145 (H) 134 - 144 mmol/L Final         Passed - Last BP in normal range    BP Readings from Last 1 Encounters:  02/09/21 107/71         Passed - Valid encounter within last 6 months    Recent Outpatient Visits          2 weeks ago Essential hypertension   [place], MD   4 months ago Need for shingles vaccine   [place], Jarome Matin, RPH-CPP   4 months ago Ischemic cardiomyopathy   [place], MD   7 months ago Lumbar radiculopathy   [place], MD   12 months ago Essential hypertension   [place], MD      Future Appointments            In 2 weeks [place], Dionne Bucy, PA-C [place]   In 2 months [place], [place], [place], [place]   In 3 months Wynetta Emery, Dalbert Batman, MD [place]

## 2021-02-27 NOTE — Telephone Encounter (Signed)
Patient is calling to report he has had rectal bleeding twice in 2 weeks. Offered appointment within disposition- but patient declined. Patient  requested appointment after 11/28- patient is going out of town- patient has been scheduled as requested- and advised although he had only seen blood twice- he could be bleeding and not know. Precautions were given and patient understands to call back if he has symptoms.

## 2021-02-27 NOTE — Telephone Encounter (Addendum)
Merlin remote received and no autocapture testing completing since 09/05/20. Patient called and device clinic apt made for 03/08/21 @ 8:00. Location, date and time discussed.

## 2021-02-28 NOTE — Telephone Encounter (Signed)
Patient given sooner appointment.

## 2021-03-02 NOTE — Progress Notes (Signed)
Remote ICD transmission.   

## 2021-03-03 NOTE — Progress Notes (Signed)
EPIC Encounter for ICM Monitoring  Patient Name: [name] is a 60 y.o. male Date: 03/03/2021 Primary Care Physican: Ladell Pier, MD Primary Cardiologist: [place] Electrophysiologist: [place] 01/23/2021 Weight: 263 lbs                                                            Spoke with patient and heart failure questions reviewed.  Pt asymptomatic for fluid accumulation and feeling well.  H e is currently in [place] and in a snowstorm.  He is unsure if he will get back for the device clinic 11/22.   CorVue thoracic impedance suggesting normal fluid levels.   Prescribed: Furosemide 20 mg take 1 tablet by mouth daily. Spironolactone 25 mg take 1 tablet daily   Labs: 11/30/2020 Creatinine 1.07, BUN 14, Potassium 4.0, Sodium 138, GFR >60 09/10/2020 Creatinine 1.18, BUN 14, Potassium 4.3, Sodium 141, GFR >60  A complete set of results can be found in Results Review.   Recommendations:  No changes and encouraged to call if experiencing any fluid symptoms.   Follow-up plan: ICM clinic phone appointment on 04/03/2021.   91 day device clinic remote transmission 05/24/2021.     EP/Cardiology Office Visits: 05/02/2021 with Almyra Deforest, PA.  08/28/2021 with Dr Rayann Heman.   Copy of ICM check sent to Dr. Rayann Heman.    3 month ICM trend: 02/27/2021.    12-14 Month ICM trend:       Rosalene Billings, RN 03/03/2021 8:32 AM

## 2021-03-05 ENCOUNTER — Other Ambulatory Visit: Payer: Self-pay | Admitting: Internal Medicine

## 2021-03-05 DIAGNOSIS — E785 Hyperlipidemia, unspecified: Secondary | ICD-10-CM

## 2021-03-05 DIAGNOSIS — I25810 Atherosclerosis of coronary artery bypass graft(s) without angina pectoris: Secondary | ICD-10-CM

## 2021-03-05 NOTE — Telephone Encounter (Signed)
Requested medication (s) are due for refill today: no  Requested medication (s) are on the active medication list: yes  Last refill:  04/21/20 #90 3 RF  Future visit scheduled: yes 03/06/21  Notes to clinic:  pt has appt with Dr. Joya Gaskins tomorrow for a med refill visit   Requested Prescriptions  Pending Prescriptions Disp Refills   atorvastatin (LIPITOR) 80 MG tablet [Pharmacy Med Name: ATORVASTATIN CALCIUM 80 MG Tablet] 90 tablet 3    Sig: TAKE 1 TABLET EVERY DAY     Cardiovascular:  Antilipid - Statins Failed - 03/05/2021  3:24 AM      Failed - Total Cholesterol in normal range and within 360 days    Cholesterol, Total  Date Value Ref Range Status  02/29/2020 162 100 - 199 mg/dL Final          Failed - LDL in normal range and within 360 days    LDL Chol Calc (NIH)  Date Value Ref Range Status  02/29/2020 85 0 - 99 mg/dL Final          Failed - HDL in normal range and within 360 days    HDL  Date Value Ref Range Status  02/29/2020 57 >39 mg/dL Final          Failed - Triglycerides in normal range and within 360 days    Triglycerides  Date Value Ref Range Status  02/29/2020 110 0 - 149 mg/dL Final          Passed - Patient is not pregnant      Passed - Valid encounter within last 12 months    Recent Outpatient Visits           3 weeks ago Essential hypertension   [place], MD   4 months ago Need for shingles vaccine   [place], Jarome Matin, RPH-CPP   4 months ago Ischemic cardiomyopathy   [place], MD   7 months ago Lumbar radiculopathy   [place], Deborah B, MD   1 year ago Essential hypertension   [place], MD       Future Appointments             Tomorrow Elsie Stain, MD [place]   In 1 month [place], [place], [place] [facility] [place], [place]   In 3 months Wynetta Emery, Dalbert Batman, MD [place]

## 2021-03-05 NOTE — Progress Notes (Signed)
Established Patient Office Visit  Subjective:  Patient ID: [name], male    DOB: [birth year]  Age: 60 y.o. MRN: [id number]  CC:  Chief Complaint  Patient presents with   Rectal Bleeding    HPI [name] presents for history of rectal bleeding intermittently for 1 month.  He states it comes on if he strains too hard to have a bowel movement.  He denies any hernia conditions or anal irritations.  This happen once a month ago and 1 time prior to this 6 months ago.  He is not currently bleeding at this time.  Patient's past history with that of cardiomyopathy chronic systolic heart failure hypertension lumbar disease tobacco use vision loss obesity prediabetes prior history of bypass surgery  Patient is on aspirin low-dose but no other anticoagulants  Patient does not need any refills at this time  Patient declined a flu vaccine Past Medical History:  Diagnosis Date   Abnormal EKG    hx of ischemia showing up on ekg's   Acute myopericarditis    a. 03/2013: readm with hypoxia, tachycardia, elevated ESR/CRP, elevated troponin with Dressler syndrome and myopericarditis; treated with steroids.   Acute respiratory failure ([place])    a. 01/2013: VDRF. b. 03/2013: hypoxia requiring supp O2 during adm, resolved by discharge.   C. difficile colitis    a. 01/2013 during prolonged adm. b. Recurred 03/2013.   Cardiac tamponade    a. 01/2013 s/p drain.   Cataracts, bilateral    Coronary artery disease    a. s/p MI in 2009 in [place] with stenting of the LCX and LAD;  b. 12/2012 NSTEMI/CAD: LM nl, LAD patent prox stent, LCX 50-70 isr (FFR 0.84), RCA dom, 162m EF 40-45%-->Med Rx. c. 01/2013: anterolateral STEMI complicated by pericardial effusion (presumed purulent pericarditis) and tamponade s/p drain, ruptured LV pseudoaneurysm s/p CorMatrix patch, CABGx1 (SVG-OM1), fever, CVA, VDRF, C diff.   CVA (cerebral infarction)    a. 01/2013 in setting of prolonged hospitalization - residual L  arm weakness.   Dressler syndrome [place]    a. 03/2013: readm with hypoxia, tachycardia, elevated ESR/CRP, elevated troponin with Dressler syndrome and myopericarditis; treated with steroids.   Glaucoma    Hyperlipidemia    Hypokalemia    Hyponatremia    a. During late 2014.   Ischemic cardiomyopathy    a. Sept/Oct 2014: EF ~40% (ICM). b. 03/2013: EF 25-30%. Off ACEI due to hypotension (MIXED NICM/ICM).   Optic neuropathy, ischemic    Pericardial effusion    a. 01/2013: pericardial effusion (presumed purulent pericarditis) and cardiac tamponade s/p drain. b. Persistent moderate pericardial effusion 03/2013.   Pre-diabetes    Pseudoaneurysm of left ventricle of heart    a. 01/2013:  Ruptured inferoposterior LV pseudoaneurysm s/p CorMatrix patch.   Sinus bradycardia    asymptomatic   Stroke ([facility] 2009   weak lt side-lt arm   Tobacco abuse    Wears dentures    top    Past Surgical History:  Procedure Laterality Date   CARDIAC CATHETERIZATION  01/06/2013   COLONOSCOPY WITH PROPOFOL N/A 07/03/2016   Procedure: COLONOSCOPY WITH PROPOFOL;  Surgeon: SManus Gunning MD;  Location: [facility];  Service: Gastroenterology;  Laterality: N/A;   CORONARY ANGIOPLASTY WITH STENT PLACEMENT  2000's X 2   "1 + 1" (01/06/2013)   CORONARY ARTERY BYPASS GRAFT N/A 01/28/2013   Procedure: CORONARY ARTERY BYPASS GRAFTING (CABG);  Surgeon: SMelrose Nakayama MD;  [place]  Service: Open  Heart Surgery;  Laterality: N/A;  CABG x one, using left greater saphenous vein harvested endoscopically   ICD IMPLANT N/A 05/25/2020   Procedure: ICD IMPLANT;  Surgeon: Thompson Grayer, MD;  Location: [place] CV LAB;  Service: Cardiovascular;  Laterality: N/A;   INTRAOPERATIVE TRANSESOPHAGEAL ECHOCARDIOGRAM N/A 01/28/2013   Procedure: INTRAOPERATIVE TRANSESOPHAGEAL ECHOCARDIOGRAM;  Surgeon: Melrose Nakayama, MD;  Location: [place];  Service: Open Heart Surgery;  Laterality: N/A;   LEFT HEART CATH AND  CORS/GRAFTS ANGIOGRAPHY N/A 09/13/2020   Procedure: LEFT HEART CATH AND CORS/GRAFTS ANGIOGRAPHY;  Surgeon: Martinique, Peter M, MD;  Location: [place] [facility];  Service: Cardiovascular;  Laterality: N/A;   LEFT HEART CATHETERIZATION WITH CORONARY ANGIOGRAM N/A 01/06/2013   Procedure: LEFT HEART CATHETERIZATION WITH CORONARY ANGIOGRAM;  Surgeon: Peter M Martinique, MD;  Location: [facility] C[facility] LAB;  Service: Cardiovascular;  Laterality: N/A;   LEFT HEART CATHETERIZATION WITH CORONARY ANGIOGRAM N/A 01/21/2013   Procedure: LEFT HEART CATHETERIZATION WITH CORONARY ANGIOGRAM;  Surgeon: Burnell Blanks, MD;  Location: [facility] CATH LAB;  Service: Cardiovascular;  Laterality: N/A;   LUMBAR LAMINECTOMY/ DECOMPRESSION WITH MET-RX Right 06/30/2018   Procedure: Right minimally invasive Lumbar Three-Four Far lateral discectomy;  Surgeon: Judith Part, MD;  Location: [place];  Service: Neurosurgery;  Laterality: Right;  Right minimally invasive Lumbar Three-Four Far lateral discectomy   MASS EXCISION Right 07/21/2014   Procedure: EXCISION RIGHT NECK MASS;  Surgeon: Erroll Luna, MD;  Location: [place];  Service: General;  Laterality: Right;   PERICARDIAL TAP N/A 01/24/2013   Procedure: PERICARDIAL TAP;  Surgeon: Blane Ohara, MD;  Location: [facility] CATH LAB;  Service: Cardiovascular;  Laterality: N/A;   RIGHT HEART CATHETERIZATION Right 01/24/2013   Procedure: RIGHT HEART CATH;  Surgeon: Blane Ohara, MD;  Location: [facility] CATH LAB;  Service: Cardiovascular;  Laterality: Right;   VENTRICULAR ANEURYSM RESECTION N/A 01/28/2013   Procedure: LEFT VENTRICULAR ANEURYSM REPAIR;  Surgeon: Melrose Nakayama, MD;  Location: [place];  Service: Open Heart Surgery;  Laterality: N/A;    Family History  Problem Relation Age of Onset   Heart attack Mother    Diabetes Mother    Hypertension Mother    Hypertension Father    CAD Other    HIV Brother    Stroke Neg Hx     Social History   Socioeconomic History    Marital status: Single    Spouse name: Not on file   Number of children: 2   Years of education: 12th   Highest education level: Not on file  Occupational History   Occupation: N/A  Tobacco Use   Smoking status: Every Day    Packs/day: 1.00    Years: 35.00    Pack years: 35.00    Types: Cigarettes   Smokeless tobacco: Never  Vaping Use   Vaping Use: Never used  Substance and Sexual Activity   Alcohol use: No    Alcohol/week: 0.0 standard drinks   Drug use: No   Sexual activity: Yes  Other Topics Concern   Not on file  Social History Narrative   Lives in [place] with wife.  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  He is now retired on disability.  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      Caffeine Use: very rarely   Social Determinants of Radio broadcast assistant Strain: Not on file  Food Insecurity: Not on file  Transportation Needs: Not on  file  Physical Activity: Not on file  Stress: Not on file  Social Connections: Not on file  Intimate Partner Violence: Not on file    Outpatient Medications Prior to Visit  Medication Sig Dispense Refill   ALPHAGAN P 0.1 % SOLN INSTILL 1 DROP INTO BOTH EYES TWICE A DAY (Patient taking differently: Place 1 drop into both eyes 2 (two) times daily.) 10 mL 0   aspirin 81 MG EC tablet Take 1 tablet (81 mg total) by mouth daily. Restart on 07/05/2018 (Patient taking differently: Take 81 mg by mouth daily.) 90 tablet 3   carvedilol (COREG) 25 MG tablet TAKE 1 TABLET BY MOUTH TWICE A DAY WITH A MEAL (Patient taking differently: Take 25 mg by mouth 2 (two) times daily with a meal.) 180 tablet 3   Cholecalciferol (VITAMIN D) 125 MCG (5000 UT) CAPS Take 5,000 Units by mouth daily.     dapagliflozin propanediol (FARXIGA) 10 MG TABS tablet Take 1 tablet (10 mg total) by mouth daily. 90 tablet 3   ENTRESTO 97-103 MG TAKE 1 TABLET BY MOUTH TWICE A DAY (Patient taking differently: Take 1 tablet by mouth 2 (two) times daily.) 180  tablet 3   furosemide (LASIX) 20 MG tablet Take 1 tablet (20 mg total) by mouth daily. 90 tablet 3   gabapentin (NEURONTIN) 300 MG capsule TAKE 1 CAPSULE BY MOUTH EVERYDAY AT BEDTIME 90 capsule 0   latanoprost (XALATAN) 0.005 % ophthalmic solution Place 1 drop into both eyes at bedtime.  12   methocarbamol (ROBAXIN) 500 MG tablet TAKE 1 TABLET (500 MG TOTAL) BY MOUTH 2 (TWO) TIMES DAILY AS NEEDED FOR MUSCLE SPASMS. 30 tablet 2   Multiple Vitamin (MULTIVITAMIN WITH MINERALS) TABS tablet Take 1 tablet by mouth daily. Centrum Silver     nicotine (NICODERM CQ - DOSED IN MG/24 HR) 7 mg/24hr patch Place 1 patch (7 mg total) onto the skin daily. 28 patch 1   nitroGLYCERIN (NITROSTAT) 0.4 MG SL tablet Place 1 tablet (0.4 mg total) under the tongue every 5 (five) minutes x 3 doses as needed for chest pain. 25 tablet 1   spironolactone (ALDACTONE) 25 MG tablet TAKE 1 TABLET EVERY DAY 90 tablet 1   traMADol (ULTRAM) 50 MG tablet Take 1 tablet (50 mg total) by mouth every 8 (eight) hours as needed. (Patient taking differently: Take 50 mg by mouth every 8 (eight) hours as needed for moderate pain.) 30 tablet 0   atorvastatin (LIPITOR) 80 MG tablet TAKE 1 TABLET BY MOUTH EVERY DAY (Patient taking differently: Take 80 mg by mouth daily.) 90 tablet 3   No facility-administered medications prior to visit.    No Known Allergies  ROS Review of Systems  Constitutional:  Negative for chills, diaphoresis and fever.  HENT:  Negative for congestion, hearing loss, nosebleeds, sore throat and tinnitus.   Eyes:  Negative for photophobia and redness.  Respiratory:  Negative for cough, shortness of breath, wheezing and stridor.   Cardiovascular:  Negative for chest pain, palpitations and leg swelling.  Gastrointestinal:  Positive for blood in stool. Negative for abdominal distention, abdominal pain, anal bleeding, constipation, diarrhea, nausea, rectal pain and vomiting.  Endocrine: Negative for polydipsia.   Genitourinary:  Negative for dysuria, flank pain, frequency, hematuria and urgency.  Musculoskeletal:  Negative for back pain, myalgias and neck pain.  Skin:  Negative for rash.  Allergic/Immunologic: Negative for environmental allergies.  Neurological:  Negative for dizziness, tremors, seizures, weakness and headaches.  Hematological:  Does not  bruise/bleed easily.  Psychiatric/Behavioral:  Negative for suicidal ideas. The patient is not nervous/anxious.      Objective:    Physical Exam Vitals reviewed.  Constitutional:      Appearance: Normal appearance. He is well-developed. He is not diaphoretic.  HENT:     Head: Normocephalic and atraumatic.     Nose: No nasal deformity, septal deviation, mucosal edema or rhinorrhea.     Right Sinus: No maxillary sinus tenderness or frontal sinus tenderness.     Left Sinus: No maxillary sinus tenderness or frontal sinus tenderness.     Mouth/Throat:     Pharynx: No oropharyngeal exudate.  Eyes:     General: No scleral icterus.    Conjunctiva/sclera: Conjunctivae normal.     Pupils: Pupils are equal, round, and reactive to light.  Neck:     Thyroid: No thyromegaly.     Vascular: No carotid bruit or JVD.     Trachea: Trachea normal. No tracheal tenderness or tracheal deviation.  Cardiovascular:     Rate and Rhythm: Normal rate and regular rhythm.     Chest Wall: PMI is not displaced.     Pulses: Normal pulses. No decreased pulses.     Heart sounds: Normal heart sounds, S1 normal and S2 normal. Heart sounds not distant. No murmur heard. No systolic murmur is present.  No diastolic murmur is present.    No friction rub. No gallop. No S3 or S4 sounds.  Pulmonary:     Effort: No tachypnea, accessory muscle usage or respiratory distress.     Breath sounds: No stridor. No decreased breath sounds, wheezing, rhonchi or rales.  Chest:     Chest wall: No tenderness.  Abdominal:     General: Bowel sounds are normal. There is no distension.      Palpations: Abdomen is soft. Abdomen is not rigid.     Tenderness: There is no abdominal tenderness. There is no guarding or rebound.  Genitourinary:    Prostate: Not enlarged and no nodules present.     Rectum: Normal. Guaiac result negative. No mass, tenderness, anal fissure, external hemorrhoid or internal hemorrhoid. Normal anal tone.  Musculoskeletal:        General: Normal range of motion.     Cervical back: Normal range of motion and neck supple. No edema, erythema or rigidity. No muscular tenderness. Normal range of motion.  Lymphadenopathy:     Head:     Right side of head: No submental or submandibular adenopathy.     Left side of head: No submental or submandibular adenopathy.     Cervical: No cervical adenopathy.  Skin:    General: Skin is warm and dry.     Coloration: Skin is not pale.     Findings: No rash.     Nails: There is no clubbing.  Neurological:     Mental Status: He is alert and oriented to person, place, and time.     Sensory: No sensory deficit.  Psychiatric:        Speech: Speech normal.        Behavior: Behavior normal.    BP (!) 129/94   Pulse 62   Resp 16   Wt 254 lb 3.2 oz (115.3 kg)   SpO2 93%   BMI 33.54 kg/m  Wt Readings from Last 3 Encounters:  03/06/21 254 lb 3.2 oz (115.3 kg)  02/09/21 251 lb 3.2 oz (113.9 kg)  01/31/21 257 lb (116.6 kg)     Health Maintenance Due  Topic Date Due   Hepatitis C Screening  Never done   COVID-19 Vaccine (3 - Booster for Moderna series) 08/21/2019   Zoster Vaccines- Shingrix (2 of 2) 12/23/2020    There are no preventive care reminders to display for this patient.  Lab Results  Component Value Date   TSH 0.604 01/05/2013   Lab Results  Component Value Date   WBC 4.8 11/30/2020   HGB 15.7 11/30/2020   HCT 46.4 11/30/2020   MCV 88.4 11/30/2020   PLT 164 11/30/2020   Lab Results  Component Value Date   NA 138 11/30/2020   K 4.0 11/30/2020   CO2 21 (L) 11/30/2020   GLUCOSE 116 (H)  11/30/2020   BUN 14 11/30/2020   CREATININE 1.07 11/30/2020   BILITOT 0.3 02/29/2020   ALKPHOS 137 (H) 02/29/2020   AST 14 02/29/2020   ALT 15 02/29/2020   PROT 6.5 02/29/2020   ALBUMIN 4.3 02/29/2020   CALCIUM 9.3 11/30/2020   ANIONGAP 8 11/30/2020   GFR 114.93 09/06/2014   Lab Results  Component Value Date   CHOL 162 02/29/2020   Lab Results  Component Value Date   HDL 57 02/29/2020   Lab Results  Component Value Date   LDLCALC 85 02/29/2020   Lab Results  Component Value Date   TRIG 110 02/29/2020   Lab Results  Component Value Date   CHOLHDL 2.8 02/29/2020   Lab Results  Component Value Date   HGBA1C 6.2 (H) 02/29/2020      Assessment & Plan:   Problem List Items Addressed This Visit       Digestive   Rectal bleed - Primary    History of rectal bleeding  No evidence of bleeding now guaiac negative  Plan to check pro time and CBC  Patient to call back if bleeding recurs      Relevant Orders   CBC with Differential/Platelet   Protime-INR   Other Visit Diagnoses     Need for hepatitis C screening test       Relevant Orders   HCV Ab w Reflex to Quant PCR       No orders of the defined types were placed in this encounter.   Follow-up: Keep Feb appt with dr Oliva Bustard, MD

## 2021-03-06 ENCOUNTER — Other Ambulatory Visit: Payer: Self-pay

## 2021-03-06 ENCOUNTER — Ambulatory Visit: Payer: Medicare HMO | Attending: Physician Assistant | Admitting: Critical Care Medicine

## 2021-03-06 ENCOUNTER — Encounter: Payer: Self-pay | Admitting: Critical Care Medicine

## 2021-03-06 VITALS — BP 129/94 | HR 62 | Resp 16 | Wt 254.2 lb

## 2021-03-06 DIAGNOSIS — Z1159 Encounter for screening for other viral diseases: Secondary | ICD-10-CM | POA: Diagnosis not present

## 2021-03-06 DIAGNOSIS — K625 Hemorrhage of anus and rectum: Secondary | ICD-10-CM

## 2021-03-06 NOTE — Patient Instructions (Addendum)
No evidence of blood in your stool today monitor your stool for further blood and call us and let us know if it recurs  Blood count today blood clotting study and hepatitis C screening was obtained today with your labs we will call you results  No change in medications for now  Keep your previous we schedule upcoming appointments with primary care Dr. Wynetta Emery on Jun 12, 2021

## 2021-03-06 NOTE — Assessment & Plan Note (Signed)
History of rectal bleeding  No evidence of bleeding now guaiac negative  Plan to check pro time and CBC  Patient to call back if bleeding recurs

## 2021-03-07 LAB — CBC WITH DIFFERENTIAL/PLATELET
Basophils Absolute: 0.1 10*3/uL (ref 0.0–0.2)
Basos: 1 %
EOS (ABSOLUTE): 0.1 10*3/uL (ref 0.0–0.4)
Eos: 1 %
Hematocrit: 45.4 % (ref 37.5–51.0)
Hemoglobin: 15.8 g/dL (ref 13.0–17.7)
Immature Grans (Abs): 0 10*3/uL (ref 0.0–0.1)
Immature Granulocytes: 0 %
Lymphocytes Absolute: 2.9 10*3/uL (ref 0.7–3.1)
Lymphs: 52 %
MCH: 29.6 pg (ref 26.6–33.0)
MCHC: 34.8 g/dL (ref 31.5–35.7)
MCV: 85 fL (ref 79–97)
Monocytes Absolute: 0.6 10*3/uL (ref 0.1–0.9)
Monocytes: 11 %
Neutrophils Absolute: 1.9 10*3/uL (ref 1.4–7.0)
Neutrophils: 35 %
Platelets: 178 10*3/uL (ref 150–450)
RBC: 5.34 x10E6/uL (ref 4.14–5.80)
RDW: 13.9 % (ref 11.6–15.4)
WBC: 5.4 10*3/uL (ref 3.4–10.8)

## 2021-03-07 LAB — HCV AB W REFLEX TO QUANT PCR: HCV Ab: 0.2 s/co ratio (ref 0.0–0.9)

## 2021-03-07 LAB — PROTIME-INR
INR: 1.1 (ref 0.9–1.2)
Prothrombin Time: 11.5 s (ref 9.1–12.0)

## 2021-03-07 LAB — HCV INTERPRETATION

## 2021-03-08 ENCOUNTER — Ambulatory Visit (INDEPENDENT_AMBULATORY_CARE_PROVIDER_SITE_OTHER): Payer: Medicare HMO

## 2021-03-08 ENCOUNTER — Other Ambulatory Visit: Payer: Self-pay

## 2021-03-08 DIAGNOSIS — I255 Ischemic cardiomyopathy: Secondary | ICD-10-CM

## 2021-03-08 LAB — CUP PACEART INCLINIC DEVICE CHECK
Battery Remaining Longevity: 116 mo
Brady Statistic RV Percent Paced: 0 %
Date Time Interrogation Session: 20221123082041
HighPow Impedance: 66.375
Implantable Lead Implant Date: 20220209
Implantable Lead Location: 753860
Implantable Pulse Generator Implant Date: 20220209
Lead Channel Impedance Value: 387.5 Ohm
Lead Channel Pacing Threshold Amplitude: 2.375 V
Lead Channel Pacing Threshold Amplitude: 2.75 V
Lead Channel Pacing Threshold Pulse Width: 0.5 ms
Lead Channel Pacing Threshold Pulse Width: 0.5 ms
Lead Channel Sensing Intrinsic Amplitude: 5.7 mV
Lead Channel Setting Pacing Amplitude: 2.625
Lead Channel Setting Pacing Pulse Width: 0.5 ms
Lead Channel Setting Sensing Sensitivity: 0.5 mV
Pulse Gen Serial Number: 111033765

## 2021-03-08 NOTE — Progress Notes (Signed)
ICD check in  device clinic to turn autocapture on.  Normal device function. Threshold and sensing consistent with previous device measurements. Impedance trends stable over time. No ventricular arrhythmias. Histogram distribution appropriate for patient and level of activity. RV autocap turned with interval of 24hrs due to patient only pacing 0%.  Device programmed at appropriate safety margins. Device programmed to optimize intrinsic conduction. Estimated longevity 9 years. Merlin 04/03/21

## 2021-03-15 ENCOUNTER — Ambulatory Visit: Payer: Medicare HMO | Admitting: Physician Assistant

## 2021-03-27 ENCOUNTER — Other Ambulatory Visit: Payer: Self-pay | Admitting: Cardiology

## 2021-04-03 ENCOUNTER — Ambulatory Visit (INDEPENDENT_AMBULATORY_CARE_PROVIDER_SITE_OTHER): Payer: Medicare HMO

## 2021-04-03 ENCOUNTER — Encounter: Payer: Medicare HMO | Admitting: Dietician

## 2021-04-03 DIAGNOSIS — Z95810 Presence of automatic (implantable) cardiac defibrillator: Secondary | ICD-10-CM

## 2021-04-03 DIAGNOSIS — I5022 Chronic systolic (congestive) heart failure: Secondary | ICD-10-CM

## 2021-04-05 ENCOUNTER — Telehealth: Payer: Self-pay

## 2021-04-05 NOTE — Progress Notes (Signed)
EPIC Encounter for ICM Monitoring  Patient Name: [name] is a 60 y.o. male Date: 04/05/2021 Primary Care Physican: Ladell Pier, MD Primary Cardiologist: Martinique Electrophysiologist: Allred 01/23/2021 Weight: 263 lbs                                                            Attempted call to patient and unable to reach.  Left detailed message per DPR regarding transmission. Transmission reviewed.    CorVue thoracic impedance suggesting normal fluid levels.   Prescribed: Furosemide 20 mg take 1 tablet by mouth daily. Spironolactone 25 mg take 1 tablet daily   Labs: 11/30/2020 Creatinine 1.07, BUN 14, Potassium 4.0, Sodium 138, GFR >60 09/10/2020 Creatinine 1.18, BUN 14, Potassium 4.3, Sodium 141, GFR >60  A complete set of results can be found in Results Review.   Recommendations:  Left voice mail with ICM number and encouraged to call if experiencing any fluid symptoms.   Follow-up plan: ICM clinic phone appointment on 05/08/2021.   91 day device clinic remote transmission 05/24/2021.     EP/Cardiology Office Visits: 05/02/2021 with Almyra Deforest, PA.  08/28/2021 with Dr Rayann Heman.   Copy of ICM check sent to Dr. Rayann Heman.    3 month ICM trend: 04/03/2021.    12-14 Month ICM trend:       Rosalene Billings, RN 04/05/2021 3:45 PM

## 2021-04-05 NOTE — Telephone Encounter (Signed)
Remote ICM transmission received.  Attempted call to patient regarding ICM remote transmission and left detailed message per DPR.  Advised to return call for any fluid symptoms or questions. Next ICM remote transmission scheduled 05/08/2021.   ° °

## 2021-05-02 ENCOUNTER — Encounter: Payer: Self-pay | Admitting: Physician Assistant

## 2021-05-02 ENCOUNTER — Other Ambulatory Visit: Payer: Self-pay

## 2021-05-02 ENCOUNTER — Ambulatory Visit (INDEPENDENT_AMBULATORY_CARE_PROVIDER_SITE_OTHER): Payer: Medicare HMO | Admitting: Physician Assistant

## 2021-05-02 VITALS — BP 116/72 | HR 65 | Ht 73.0 in | Wt 245.6 lb

## 2021-05-02 DIAGNOSIS — R7303 Prediabetes: Secondary | ICD-10-CM | POA: Diagnosis not present

## 2021-05-02 DIAGNOSIS — Z95810 Presence of automatic (implantable) cardiac defibrillator: Secondary | ICD-10-CM

## 2021-05-02 DIAGNOSIS — Z8673 Personal history of transient ischemic attack (TIA), and cerebral infarction without residual deficits: Secondary | ICD-10-CM

## 2021-05-02 DIAGNOSIS — E785 Hyperlipidemia, unspecified: Secondary | ICD-10-CM | POA: Diagnosis not present

## 2021-05-02 DIAGNOSIS — I10 Essential (primary) hypertension: Secondary | ICD-10-CM

## 2021-05-02 DIAGNOSIS — I5022 Chronic systolic (congestive) heart failure: Secondary | ICD-10-CM | POA: Diagnosis not present

## 2021-05-02 DIAGNOSIS — I25810 Atherosclerosis of coronary artery bypass graft(s) without angina pectoris: Secondary | ICD-10-CM

## 2021-05-02 NOTE — Patient Instructions (Signed)
Medication Instructions:  Your physician recommends that you continue on your current medications as directed. Please refer to the Current Medication list given to you today.    *If you need a refill on your cardiac medications before your next appointment, please call your pharmacy*   Lab Work: Your physician recommends that you return for lab work at your earliest convenience.  Fasting Lipid Panel (Do Not eat or drink after midnight prior to having labs drawn) CMP  If you have labs (blood work) drawn today and your tests are completely normal, you will receive your results only by: MyChart Message (if you have MyChart) OR A paper copy in the mail If you have any lab test that is abnormal or we need to change your treatment, we will call you to review the results.   Testing/Procedures: NONE ordered at this time of appointment     Follow-Up: At [facility], you and your health needs are our priority.  As part of our continuing mission to provide you with exceptional heart care, we have created designated Provider Care Teams.  These Care Teams include your primary Cardiologist (physician) and Advanced Practice Providers (APPs -  Physician Assistants and Nurse Practitioners) who all work together to provide you with the care you need, when you need it.  We recommend signing up for the patient portal called "MyChart".  Sign up information is provided on this After Visit Summary.  MyChart is used to connect with patients for Virtual Visits (Telemedicine).  Patients are able to view lab/test results, encounter notes, upcoming appointments, etc.  Non-urgent messages can be sent to your provider as well.   To learn more about what you can do with MyChart, go to [email or web address].    Your next appointment:   6 month(s)  The format for your next appointment:   In Person  Provider:   Peter Martinique, MD     Other Instructions

## 2021-05-02 NOTE — Progress Notes (Signed)
Cardiology Office Note:    Date:  05/04/2021   ID:  [name], DOB 09/19/1960, MRN [id number]  PCP:  Ladell Pier, MD   [facility] Providers Cardiologist:  Peter Martinique, MD Electrophysiologist:  Thompson Grayer, MD     Referring MD: Ladell Pier, MD   Chief Complaint  Patient presents with   Follow-up    Seen for Dr. Martinique    History of Present Illness:    [name] is a 61 y.o. male with a hx of CAD s/p CABG 2014, ischemic cardiomyopathy s/p ICD, HTN, HLD, prediabetes, history of cardiac tamponade, pericarditis and history of CVA.  He had a stent to left circumflex and RCA in 2007.  Repeat cardiac catheterization in September 2014 revealed totally occluded RCA, 50% in-stent restenosis of the left circumflex stent, medical therapy was recommended.  He was admitted in October 2014 due to large pericardial effusion and tamponade due to ruptured LV pseudoaneurysm.  Pericardial drain was placed.  Unfortunately prior to the discharge, he developed signs of stroke with left arm weakness and left visual field deficit.  He then developed hemodynamic collapse and required intubation.  Repeat echocardiogram showed complex pericardial effusion with right ventricular collapse and tamponade.  He underwent emergent median sternotomy and repair of the ruptured inferoseptal posterior LV pseudoaneurysm with CorMatrix patch and CABG x1 with SVG to OM by Dr. Roxan Hockey.  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  Echocardiogram at the time was 25% with moderate pericardial effusion.  By February 2017, ejection fraction has improved to 30 to 35%.  Due to persistently low ejection fraction despite optimized medical therapy, he underwent ICD implantation by Dr. Rayann Heman on 05/25/2020.  He presented to the hospital in May 2020 with chest pain.  EKG was unchanged.  The first troponin was  negative, however second troponin went up to 38.  Due to low blood pressure, carvedilol and Entresto were held.  Subsequent echocardiogram obtained on 09/11/2020 showed EF 25 to 30%, grade 2 DD, trivial MR.  Repeat cardiac catheterization performed on 09/13/2020 showed chronically occluded proximal to mid RCA, 40% proximal LAD, 50% ostial to proximal left circumflex lesion, no SVG to OM was visualized and felt to be occluded, EF was normal.  Medical therapy was recommended.  He was placed on Farxiga as he had nausea and vomiting with Jardiance.  Carvedilol and Entresto were restarted prior to discharge.  I last saw the patient in June 2022 for posthospital follow-up.  Since then, patient has been seen by Dr. Martinique in October 2022 at which time the patient was doing well.  Due to evidence of volume overload on CorVue, it was recommended for the patient to increase Lasix to 40 mg daily for 4 days before going back to 20 mg daily thereafter.  Patient presents today for follow-up.  He denies any worsening shortness of breath or chest discomfort.  He has no lower extremity edema.  His lung is clear.  Although he mentioned he had a chest cold a few weeks back, however this has resolved and his lung sounds good.  He is on appropriate cardiac medications.  He is overdue for blood work including CMP and a fasting lipid panel, this can be done on another day as he is not fasting today.  Overall, he is doing well and he can follow-up in 6 months.  Recent CorVue test in December 2022  showed normal volume.   Past Medical History:  Diagnosis Date   Abnormal EKG    hx of ischemia showing up on ekg's   Acute myopericarditis    a. 03/2013: readm with hypoxia, tachycardia, elevated ESR/CRP, elevated troponin with Dressler syndrome and myopericarditis; treated with steroids.   Acute respiratory failure ([place])    a. 01/2013: VDRF. b. 03/2013: hypoxia requiring supp O2 during adm, resolved by discharge.   C. difficile colitis     a. 01/2013 during prolonged adm. b. Recurred 03/2013.   Cardiac tamponade    a. 01/2013 s/p drain.   Cataracts, bilateral    Coronary artery disease    a. s/p MI in 2009 in [place] with stenting of the LCX and LAD;  b. 12/2012 NSTEMI/CAD: LM nl, LAD patent prox stent, LCX 50-70 isr (FFR 0.84), RCA dom, 144m EF 40-45%-->Med Rx. c. 01/2013: anterolateral STEMI complicated by pericardial effusion (presumed purulent pericarditis) and tamponade s/p drain, ruptured LV pseudoaneurysm s/p CorMatrix patch, CABGx1 (SVG-OM1), fever, CVA, VDRF, C diff.   CVA (cerebral infarction)    a. 01/2013 in setting of prolonged hospitalization - residual L arm weakness.   [place]    a. 03/2013: readm with hypoxia, tachycardia, elevated ESR/CRP, elevated troponin with Dressler syndrome and myopericarditis; treated with steroids.   Glaucoma    Hyperlipidemia    Hypokalemia    Hyponatremia    a. During late 2014.   Ischemic cardiomyopathy    a. Sept/Oct 2014: EF ~40% (ICM). b. 03/2013: EF 25-30%. Off ACEI due to hypotension (MIXED NICM/ICM).   Optic neuropathy, ischemic    Pericardial effusion    a. 01/2013: pericardial effusion (presumed purulent pericarditis) and cardiac tamponade s/p drain. b. Persistent moderate pericardial effusion 03/2013.   Pre-diabetes    Pseudoaneurysm of left ventricle of heart    a. 01/2013:  Ruptured inferoposterior LV pseudoaneurysm s/p CorMatrix patch.   Sinus bradycardia    asymptomatic   Stroke ([facility] 2009   weak lt side-lt arm   Tobacco abuse    Wears dentures    top    Past Surgical History:  Procedure Laterality Date   CARDIAC CATHETERIZATION  01/06/2013   COLONOSCOPY WITH PROPOFOL N/A 07/03/2016   Procedure: COLONOSCOPY WITH PROPOFOL;  Surgeon: SManus Gunning MD;  Location: [facility];  Service: Gastroenterology;  Laterality: N/A;   CORONARY ANGIOPLASTY WITH STENT PLACEMENT  2000's X 2   "1 + 1" (01/06/2013)   CORONARY ARTERY BYPASS GRAFT N/A  01/28/2013   Procedure: CORONARY ARTERY BYPASS GRAFTING (CABG);  Surgeon: SMelrose Nakayama MD;  [place]  Service: Open Heart Surgery;  Laterality: N/A;  CABG x one, using left greater saphenous vein harvested endoscopically   ICD IMPLANT N/A 05/25/2020   Procedure: ICD IMPLANT;  Surgeon: AThompson Grayer MD;  [facility];  Service: Cardiovascular;  Laterality: N/A;   INTRAOPERATIVE TRANSESOPHAGEAL ECHOCARDIOGRAM N/A 01/28/2013   Procedure: INTRAOPERATIVE TRANSESOPHAGEAL ECHOCARDIOGRAM;  Surgeon: SMelrose Nakayama MD;  [place] [facility]  Service: Open Heart Surgery;  Laterality: N/A;   LEFT HEART CATH AND CORS/GRAFTS ANGIOGRAPHY N/A 09/13/2020   Procedure: LEFT HEART CATH AND CORS/GRAFTS ANGIOGRAPHY;  Surgeon: JMartinique Peter M, MD;  [facility];  Service: Cardiovascular;  Laterality: N/A;   LEFT HEART CATHETERIZATION WITH CORONARY ANGIOGRAM N/A 01/06/2013   Procedure: LEFT HEART CATHETERIZATION WITH CORONARY ANGIOGRAM;  Surgeon: Peter M JMartinique MD;  [facility];  Service: Cardiovascular;  Laterality: N/A;   LEFT  HEART CATHETERIZATION WITH CORONARY ANGIOGRAM N/A 01/21/2013   Procedure: LEFT HEART CATHETERIZATION WITH CORONARY ANGIOGRAM;  Surgeon: Burnell Blanks, MD;  [facility] LAB;  Service: Cardiovascular;  Laterality: N/A;   LUMBAR LAMINECTOMY/ DECOMPRESSION WITH MET-RX Right 06/30/2018   Procedure: Right minimally invasive Lumbar Three-Four Far lateral discectomy;  Surgeon: Judith Part, MD;  Location: [place];  Service: Neurosurgery;  Laterality: Right;  Right minimally invasive Lumbar Three-Four Far lateral discectomy   MASS EXCISION Right 07/21/2014   Procedure: EXCISION RIGHT NECK MASS;  Surgeon: Erroll Luna, MD;  Location: [place];  Service: General;  Laterality: Right;   PERICARDIAL TAP N/A 01/24/2013   Procedure: PERICARDIAL TAP;  Surgeon: Blane Ohara, MD;  Location: [facility] CATH LAB;  Service:  Cardiovascular;  Laterality: N/A;   RIGHT HEART CATHETERIZATION Right 01/24/2013   Procedure: RIGHT HEART CATH;  Surgeon: Blane Ohara, MD;  Location: [facility] CATH LAB;  Service: Cardiovascular;  Laterality: Right;   VENTRICULAR ANEURYSM RESECTION N/A 01/28/2013   Procedure: LEFT VENTRICULAR ANEURYSM REPAIR;  Surgeon: Melrose Nakayama, MD;  Location: [place];  Service: Open Heart Surgery;  Laterality: N/A;    Current Medications: Current Meds  Medication Sig   ALPHAGAN P 0.1 % SOLN INSTILL 1 DROP INTO BOTH EYES TWICE A DAY   aspirin 81 MG EC tablet Take 1 tablet (81 mg total) by mouth daily. Restart on 07/05/2018   atorvastatin (LIPITOR) 80 MG tablet TAKE 1 TABLET EVERY DAY   Cholecalciferol (VITAMIN D) 125 MCG (5000 UT) CAPS Take 5,000 Units by mouth daily.   dapagliflozin propanediol (FARXIGA) 10 MG TABS tablet Take 1 tablet (10 mg total) by mouth daily.   ENTRESTO 97-103 MG TAKE 1 TABLET TWICE DAILY   furosemide (LASIX) 20 MG tablet Take 1 tablet (20 mg total) by mouth daily.   gabapentin (NEURONTIN) 300 MG capsule TAKE 1 CAPSULE BY MOUTH EVERYDAY AT BEDTIME   latanoprost (XALATAN) 0.005 % ophthalmic solution Place 1 drop into both eyes at bedtime.   methocarbamol (ROBAXIN) 500 MG tablet TAKE 1 TABLET (500 MG TOTAL) BY MOUTH 2 (TWO) TIMES DAILY AS NEEDED FOR MUSCLE SPASMS.   Multiple Vitamin (MULTIVITAMIN WITH MINERALS) TABS tablet Take 1 tablet by mouth daily. Centrum Silver   nicotine (NICODERM CQ - DOSED IN MG/24 HR) 7 mg/24hr patch Place 1 patch (7 mg total) onto the skin daily.   nitroGLYCERIN (NITROSTAT) 0.4 MG SL tablet Place 1 tablet (0.4 mg total) under the tongue every 5 (five) minutes x 3 doses as needed for chest pain.   spironolactone (ALDACTONE) 25 MG tablet TAKE 1 TABLET EVERY DAY   traMADol (ULTRAM) 50 MG tablet Take 1 tablet (50 mg total) by mouth every 8 (eight) hours as needed. (Patient taking differently: Take 50 mg by mouth every 8 (eight) hours as needed for moderate  pain.)   [DISCONTINUED] carvedilol (COREG) 25 MG tablet TAKE 1 TABLET BY MOUTH TWICE A DAY WITH A MEAL     Allergies:   Patient has no known allergies.   Social History   Socioeconomic History   Marital status: Single    Spouse name: Not on file   Number of children: 2   Years of education: 12th   Highest education level: Not on file  Occupational History   Occupation: N/A  Tobacco Use   Smoking status: Every Day    Packs/day: 1.00    Years: 35.00    Pack years: 35.00    Types: Cigarettes  Smokeless tobacco: Never  Vaping Use   Vaping Use: Never used  Substance and Sexual Activity   Alcohol use: No    Alcohol/week: 0.0 standard drinks   Drug use: No   Sexual activity: Yes  Other Topics Concern   Not on file  Social History Narrative   Lives in [place] with wife.  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  He is now retired on disability.  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      Caffeine Use: very rarely   Social Determinants of Radio broadcast assistant Strain: Not on file  Food Insecurity: Not on file  Transportation Needs: Not on file  Physical Activity: Not on file  Stress: Not on file  Social Connections: Not on file     Family History: The patient's family history includes CAD in an other family member; Diabetes in his mother; HIV in his brother; Heart attack in his mother; Hypertension in his father and mother. There is no history of Stroke.  ROS:   Please see the history of present illness.     All other systems reviewed and are negative.  EKGs/Labs/Other Studies Reviewed:    The following studies were reviewed today:  Echo 09/11/2020  1. Left ventricular ejection fraction, by estimation, is 25 to 30%. The  left ventricle has severely decreased function. The left ventricle  demonstrates regional wall motion abnormalities (see scoring  diagram/findings for description). Left ventricular  diastolic parameters are consistent with  Grade II diastolic dysfunction  (pseudonormalization). Elevated left ventricular end-diastolic pressure.  There is of the left ventricular, . There is of the left ventricular,.   2. Right ventricular systolic function was not well visualized. The right  ventricular size is normal. Tricuspid regurgitation signal is inadequate  for assessing PA pressure.   3. The mitral valve is grossly normal. Trivial mitral valve  regurgitation.   4. The aortic valve is tricuspid. Aortic valve regurgitation is not  visualized. No aortic stenosis is present.   5. The inferior vena cava is normal in size with <50% respiratory  variability, suggesting right atrial pressure of 8 mmHg.   Comparison(s): No significant change from prior study.    Cath 09/13/2020 Prox LAD lesion is 40% stenosed. Prox LAD to Mid LAD lesion is 10% stenosed. Prox Cx to Mid Cx lesion is 20% stenosed. Ost Cx to Prox Cx lesion is 50% stenosed. Prox RCA to Mid RCA lesion is 100% stenosed. SVG graft was not visualized due to known occlusion. Origin to Prox Graft lesion is 100% stenosed. LV end diastolic pressure is normal.   1. Single vessel occlusive CAD with CTO of the mid RCA.  2. Patent stents in the mid LAD and LCx.  3. No SVG to OM visualized but is felt to be occluded. 4. Low LVEDP 3 mm Hg   Plan: recommend continued medical management.    EKG:  EKG is not ordered today.   Recent Labs: 11/30/2020: B Natriuretic Peptide 144.6; BUN 14; Creatinine, Ser 1.07; Potassium 4.0; Sodium 138 03/06/2021: Hemoglobin 15.8; Platelets 178  Recent Lipid Panel    Component Value Date/Time   CHOL 162 02/29/2020 1642   TRIG 110 02/29/2020 1642   HDL 57 02/29/2020 1642   CHOLHDL 2.8 02/29/2020 1642   CHOLHDL 2.5 11/03/2015 1609   VLDL 19 11/03/2015 1609   LDLCALC 85 02/29/2020 1642     Risk Assessment/Calculations:  Physical Exam:    VS:  BP 116/72    Pulse 65    Ht 6' 1"  (1.854 m)    Wt 245 lb 9.6 oz (111.4 kg)     SpO2 98%    BMI 32.40 kg/m     Wt Readings from Last 3 Encounters:  05/02/21 245 lb 9.6 oz (111.4 kg)  03/06/21 254 lb 3.2 oz (115.3 kg)  02/09/21 251 lb 3.2 oz (113.9 kg)     GEN:  Well nourished, well developed in no acute distress HEENT: Normal NECK: No JVD; No carotid bruits LYMPHATICS: No lymphadenopathy CARDIAC: RRR, no murmurs, rubs, gallops RESPIRATORY:  Clear to auscultation without rales, wheezing or rhonchi  ABDOMEN: Soft, non-tender, non-distended MUSCULOSKELETAL:  No edema; No deformity  SKIN: Warm and dry NEUROLOGIC:  Alert and oriented x 3 PSYCHIATRIC:  Normal affect   ASSESSMENT:    1. Chronic systolic CHF (congestive heart failure) ([place])   2. Hyperlipidemia LDL goal <70   3. Coronary artery disease involving coronary bypass graft of native heart without angina pectoris   4. Essential hypertension   5. Prediabetes   6. H/O: CVA (cerebrovascular accident)   7. ICD (implantable cardioverter-defibrillator) in place    PLAN:    In order of problems listed above:  Chronic systolic heart failure: Baseline EF 25 to 30% based on last echocardiogram.  He appears to be euvolemic on exam.  Recent CorVue study also appears to show his volume is normal.  On Farxiga, Entresto, carvedilol and spironolactone  Hyperlipidemia: Continue Lipitor  CAD: Denies any chest pain.  Continue aspirin  Hypertension: Blood pressure well controlled on current therapy  Prediabetes: Followed by primary care provider  History of CVA: No recent recurrence  History of ICD: Followed by Dr. Rayann Heman            Medication Adjustments/Labs and Tests Ordered: Current medicines are reviewed at length with the patient today.  Concerns regarding medicines are outlined above.  Orders Placed This Encounter  Procedures   Lipid panel   Comprehensive metabolic panel   No orders of the defined types were placed in this encounter.   Patient Instructions  Medication Instructions:  Your  physician recommends that you continue on your current medications as directed. Please refer to the Current Medication list given to you today.    *If you need a refill on your cardiac medications before your next appointment, please call your pharmacy*   Lab Work: Your physician recommends that you return for lab work at your earliest convenience.  Fasting Lipid Panel (Do Not eat or drink after midnight prior to having labs drawn) CMP  If you have labs (blood work) drawn today and your tests are completely normal, you will receive your results only by: MyChart Message (if you have MyChart) OR A paper copy in the mail If you have any lab test that is abnormal or we need to change your treatment, we will call you to review the results.   Testing/Procedures: NONE ordered at this time of appointment     Follow-Up: At [facility], you and your health needs are our priority.  As part of our continuing mission to provide you with exceptional heart care, we have created designated Provider Care Teams.  These Care Teams include your primary Cardiologist (physician) and Advanced Practice Providers (APPs -  Physician Assistants and Nurse Practitioners) who all work together to provide you with the care you need, when you need it.  We recommend signing  up for the patient portal called "MyChart".  Sign up information is provided on this After Visit Summary.  MyChart is used to connect with patients for Virtual Visits (Telemedicine).  Patients are able to view lab/test results, encounter notes, upcoming appointments, etc.  Non-urgent messages can be sent to your provider as well.   To learn more about what you can do with MyChart, go to [email or web address].    Your next appointment:   6 month(s)  The format for your next appointment:   In Person  Provider:   Peter Martinique, MD     Other Instructions   Signed, Almyra Deforest, [place]  05/04/2021 11:09 PM    [place]

## 2021-05-03 ENCOUNTER — Other Ambulatory Visit: Payer: Self-pay | Admitting: Cardiology

## 2021-05-03 ENCOUNTER — Other Ambulatory Visit: Payer: Self-pay | Admitting: Internal Medicine

## 2021-05-03 DIAGNOSIS — I10 Essential (primary) hypertension: Secondary | ICD-10-CM

## 2021-05-03 DIAGNOSIS — I255 Ischemic cardiomyopathy: Secondary | ICD-10-CM

## 2021-05-03 NOTE — Telephone Encounter (Signed)
Requested medication (s) are due for refill today: yes. Pharmacy sent on behalf of patient.  Requested medication (s) are on the active medication list: yes    Last refill: 02/01/21  #90   3 refills  Future visit scheduled yes 06/12/21  Notes to clinic:   Historical Provider. Please review.  Requested Prescriptions  Pending Prescriptions Disp Refills   furosemide (LASIX) 20 MG tablet 90 tablet 3    Sig: Take 1 tablet (20 mg total) by mouth daily.     Cardiovascular:  Diuretics - Loop Passed - 05/03/2021  4:05 PM      Passed - K in normal range and within 360 days    Potassium  Date Value Ref Range Status  11/30/2020 4.0 3.5 - 5.1 mmol/L Final          Passed - Ca in normal range and within 360 days    Calcium  Date Value Ref Range Status  11/30/2020 9.3 8.9 - 10.3 mg/dL Final   Calcium, Ion  Date Value Ref Range Status  04/05/2014 1.09 (L) 1.12 - 1.23 mmol/L Final          Passed - Na in normal range and within 360 days    Sodium  Date Value Ref Range Status  11/30/2020 138 135 - 145 mmol/L Final  05/06/2020 145 (H) 134 - 144 mmol/L Final          Passed - Cr in normal range and within 360 days    Creatinine  Date Value Ref Range Status  12/04/2018 139.0 20.0 - 300.0 mg/dL Final   Creat  Date Value Ref Range Status  11/03/2015 0.90 0.70 - 1.33 mg/dL Final    Comment:      For patients > or = 60 years of age: The upper reference limit for Creatinine is approximately 13% higher for people identified as African-American.      Creatinine, Ser  Date Value Ref Range Status  11/30/2020 1.07 0.61 - 1.24 mg/dL Final          Passed - Last BP in normal range    BP Readings from Last 1 Encounters:  05/02/21 116/72          Passed - Valid encounter within last 6 months    Recent Outpatient Visits           1 month ago Rectal bleed   [place] Elsie Stain, MD   2 months ago Essential hypertension   [place], MD   6 months ago Need for shingles vaccine   [place], Jarome Matin, RPH-CPP   6 months ago Ischemic cardiomyopathy   [place], MD   9 months ago Lumbar radiculopathy   [place], MD       Future Appointments             In 1 month Wynetta Emery Dalbert Batman, MD [place]

## 2021-05-03 NOTE — Telephone Encounter (Signed)
Medication Refill - Medication:  furosemide (LASIX) 20 MG tablet   Has the patient contacted their pharmacy? No. Pharmacy called on behalf of the pt  Preferred Pharmacy (with phone number or street name):  [place], [place] Phone:  [phone]  Fax:  [phone]      Has the patient been seen for an appointment in the last year OR does the patient have an upcoming appointment? Yes.    Agent: Please be advised that RX refills may take up to 3 business days. We ask that you follow-up with your pharmacy.

## 2021-05-03 NOTE — Telephone Encounter (Signed)
This is Dr. Jordan's pt. °

## 2021-05-04 ENCOUNTER — Encounter: Payer: Self-pay | Admitting: Physician Assistant

## 2021-05-08 ENCOUNTER — Ambulatory Visit (INDEPENDENT_AMBULATORY_CARE_PROVIDER_SITE_OTHER): Payer: Medicare HMO

## 2021-05-08 DIAGNOSIS — I5022 Chronic systolic (congestive) heart failure: Secondary | ICD-10-CM | POA: Diagnosis not present

## 2021-05-08 DIAGNOSIS — Z95810 Presence of automatic (implantable) cardiac defibrillator: Secondary | ICD-10-CM | POA: Diagnosis not present

## 2021-05-08 NOTE — Progress Notes (Signed)
EPIC Encounter for ICM Monitoring  Patient Name: [name] is a 61 y.o. male Date: 05/08/2021 Primary Care Physican: Ladell Pier, MD Primary Cardiologist: Martinique Electrophysiologist: Allred 01/23/2021 Weight: 263 lbs                                                            Attempted call to patient and unable to reach.   Transmission reviewed.    CorVue thoracic impedance suggesting possible fluid accumulation starting 1/19.  Suggesting possible fluid accumulation starting 12/22-1/9 and suggesting dryness 1/10-1/19.   Prescribed: Furosemide 20 mg take 1 tablet by mouth daily. Spironolactone 25 mg take 1 tablet daily   Labs: 11/30/2020 Creatinine 1.07, BUN 14, Potassium 4.0, Sodium 138, GFR >60 09/10/2020 Creatinine 1.18, BUN 14, Potassium 4.3, Sodium 141, GFR >60  A complete set of results can be found in Results Review.   Recommendations:  Unable to reach.     Follow-up plan: ICM clinic phone appointment on 05/16/2021.   91 day device clinic remote transmission 05/24/2021.     EP/Cardiology Office Visits:  08/28/2021 with Dr Rayann Heman.   Copy of ICM check sent to Dr. Rayann Heman.  Will send to Dr Martinique if patient is reached.  3 month ICM trend: 05/08/2021.    12-14 Month ICM trend:     Rosalene Billings, RN 05/08/2021 11:10 AM

## 2021-05-15 DIAGNOSIS — H52203 Unspecified astigmatism, bilateral: Secondary | ICD-10-CM | POA: Diagnosis not present

## 2021-05-15 DIAGNOSIS — H25813 Combined forms of age-related cataract, bilateral: Secondary | ICD-10-CM | POA: Diagnosis not present

## 2021-05-15 DIAGNOSIS — H5203 Hypermetropia, bilateral: Secondary | ICD-10-CM | POA: Diagnosis not present

## 2021-05-15 DIAGNOSIS — H401132 Primary open-angle glaucoma, bilateral, moderate stage: Secondary | ICD-10-CM | POA: Diagnosis not present

## 2021-05-15 DIAGNOSIS — H524 Presbyopia: Secondary | ICD-10-CM | POA: Diagnosis not present

## 2021-05-16 ENCOUNTER — Ambulatory Visit (INDEPENDENT_AMBULATORY_CARE_PROVIDER_SITE_OTHER): Payer: Medicare HMO

## 2021-05-16 DIAGNOSIS — I5022 Chronic systolic (congestive) heart failure: Secondary | ICD-10-CM

## 2021-05-16 DIAGNOSIS — Z95810 Presence of automatic (implantable) cardiac defibrillator: Secondary | ICD-10-CM

## 2021-05-17 ENCOUNTER — Telehealth: Payer: Self-pay

## 2021-05-17 HISTORY — PX: CATARACT EXTRACTION: SUR2

## 2021-05-17 NOTE — Telephone Encounter (Signed)
Remote ICM transmission received.  Attempted call to patient regarding ICM remote transmission and no answer or voice mail option.  

## 2021-05-17 NOTE — Progress Notes (Signed)
EPIC Encounter for ICM Monitoring  Patient Name: DEQUAVIUS KUHNER is a 61 y.o. male Date: 05/17/2021 Primary Care Physican: Ladell Pier, MD Primary Cardiologist: Martinique Electrophysiologist: Allred 01/23/2021 Weight: 263 lbs                                                            Attempted call to patient and unable to reach.   Transmission reviewed.    CorVue thoracic impedance suggesting fluid levels returned to normal 1/28.    Prescribed: Furosemide 20 mg take 1 tablet by mouth daily. Spironolactone 25 mg take 1 tablet daily   Labs: 11/30/2020 Creatinine 1.07, BUN 14, Potassium 4.0, Sodium 138, GFR >60 09/10/2020 Creatinine 1.18, BUN 14, Potassium 4.3, Sodium 141, GFR >60  A complete set of results can be found in Results Review.   Recommendations:  Unable to reach.     Follow-up plan: ICM clinic phone appointment on 06/12/2021.   91 day device clinic remote transmission 05/24/2021.     EP/Cardiology Office Visits:  08/28/2021 with Dr Rayann Heman.   Copy of ICM check sent to Dr. Rayann Heman.   3 month ICM trend: 05/16/2021.    12-14 Month ICM trend:     Rosalene Billings, RN 05/17/2021 4:27 PM

## 2021-05-24 ENCOUNTER — Ambulatory Visit (INDEPENDENT_AMBULATORY_CARE_PROVIDER_SITE_OTHER): Payer: Medicare HMO

## 2021-05-24 DIAGNOSIS — I255 Ischemic cardiomyopathy: Secondary | ICD-10-CM

## 2021-05-24 LAB — CUP PACEART REMOTE DEVICE CHECK
Battery Remaining Longevity: 106 mo
Battery Remaining Percentage: 89 %
Battery Voltage: 3.01 V
Brady Statistic RV Percent Paced: 0 %
Date Time Interrogation Session: 20230207020703
HighPow Impedance: 71 Ohm
Implantable Lead Implant Date: 20220209
Implantable Lead Location: 753860
Implantable Pulse Generator Implant Date: 20220209
Lead Channel Impedance Value: 400 Ohm
Lead Channel Pacing Threshold Amplitude: 2.375 V
Lead Channel Pacing Threshold Pulse Width: 0.5 ms
Lead Channel Sensing Intrinsic Amplitude: 6.8 mV
Lead Channel Setting Pacing Amplitude: 2.625
Lead Channel Setting Pacing Pulse Width: 0.5 ms
Lead Channel Setting Sensing Sensitivity: 0.5 mV
Pulse Gen Serial Number: 111033765

## 2021-05-25 DIAGNOSIS — I5022 Chronic systolic (congestive) heart failure: Secondary | ICD-10-CM | POA: Diagnosis not present

## 2021-05-25 DIAGNOSIS — E785 Hyperlipidemia, unspecified: Secondary | ICD-10-CM | POA: Diagnosis not present

## 2021-05-25 DIAGNOSIS — I10 Essential (primary) hypertension: Secondary | ICD-10-CM | POA: Diagnosis not present

## 2021-05-25 DIAGNOSIS — R7303 Prediabetes: Secondary | ICD-10-CM | POA: Diagnosis not present

## 2021-05-25 DIAGNOSIS — I25810 Atherosclerosis of coronary artery bypass graft(s) without angina pectoris: Secondary | ICD-10-CM | POA: Diagnosis not present

## 2021-05-25 LAB — COMPREHENSIVE METABOLIC PANEL
ALT: 15 IU/L (ref 0–44)
AST: 17 IU/L (ref 0–40)
Albumin/Globulin Ratio: 1.8 (ref 1.2–2.2)
Albumin: 4.4 g/dL (ref 3.8–4.9)
Alkaline Phosphatase: 147 IU/L — ABNORMAL HIGH (ref 44–121)
BUN/Creatinine Ratio: 11 (ref 9–20)
BUN: 12 mg/dL (ref 6–24)
Bilirubin Total: 0.4 mg/dL (ref 0.0–1.2)
CO2: 23 mmol/L (ref 20–29)
Calcium: 9.5 mg/dL (ref 8.7–10.2)
Chloride: 101 mmol/L (ref 96–106)
Creatinine, Ser: 1.05 mg/dL (ref 0.76–1.27)
Globulin, Total: 2.4 g/dL (ref 1.5–4.5)
Glucose: 115 mg/dL — ABNORMAL HIGH (ref 70–99)
Potassium: 4.6 mmol/L (ref 3.5–5.2)
Sodium: 139 mmol/L (ref 134–144)
Total Protein: 6.8 g/dL (ref 6.0–8.5)
eGFR: 82 mL/min/{1.73_m2} (ref >59)

## 2021-05-25 LAB — LIPID PANEL
Chol/HDL Ratio: 2.6 ratio (ref 0.0–5.0)
Cholesterol, Total: 148 mg/dL (ref 100–199)
HDL: 56 mg/dL (ref >39)
LDL Chol Calc (NIH): 72 mg/dL (ref 0–99)
Triglycerides: 112 mg/dL (ref 0–149)
VLDL Cholesterol Cal: 20 mg/dL (ref 5–40)

## 2021-05-29 NOTE — Progress Notes (Signed)
Remote ICD transmission.   

## 2021-05-31 DIAGNOSIS — R6889 Other general symptoms and signs: Secondary | ICD-10-CM | POA: Diagnosis not present

## 2021-05-31 DIAGNOSIS — H25812 Combined forms of age-related cataract, left eye: Secondary | ICD-10-CM | POA: Diagnosis not present

## 2021-05-31 DIAGNOSIS — H25011 Cortical age-related cataract, right eye: Secondary | ICD-10-CM | POA: Diagnosis not present

## 2021-05-31 DIAGNOSIS — H2511 Age-related nuclear cataract, right eye: Secondary | ICD-10-CM | POA: Diagnosis not present

## 2021-06-06 ENCOUNTER — Other Ambulatory Visit: Payer: Self-pay | Admitting: Internal Medicine

## 2021-06-06 DIAGNOSIS — M5416 Radiculopathy, lumbar region: Secondary | ICD-10-CM

## 2021-06-06 NOTE — Telephone Encounter (Signed)
Requested medications are due for refill today.  yes  Requested medications are on the active medications list.  yes  Last refill. 02/20/2021 #90 0 refills  Future visit scheduled.   yes  Notes to clinic.  Pharmacy needs Dx code.    Requested Prescriptions  Pending Prescriptions Disp Refills   gabapentin (NEURONTIN) 300 MG capsule [Pharmacy Med Name: GABAPENTIN 300 MG CAPSULE] 90 capsule 0    Sig: TAKE 1 CAPSULE BY MOUTH EVERYDAY AT BEDTIME     Neurology: Anticonvulsants - gabapentin Passed - 06/06/2021  1:08 PM      Passed - Cr in normal range and within 360 days    Creatinine  Date Value Ref Range Status  12/04/2018 139.0 20.0 - 300.0 mg/dL Final   Creat  Date Value Ref Range Status  11/03/2015 0.90 0.70 - 1.33 mg/dL Final    Comment:      For patients > or = 60 years of age: The upper reference limit for Creatinine is approximately 13% higher for people identified as African-American.      Creatinine, Ser  Date Value Ref Range Status  05/25/2021 1.05 0.76 - 1.27 mg/dL Final          Passed - Completed PHQ-2 or PHQ-9 in the last 360 days      Passed - Valid encounter within last 12 months    Recent Outpatient Visits           3 months ago Rectal bleed   [place], MD   3 months ago Essential hypertension   [place], MD   7 months ago Need for shingles vaccine   [place], Jarome [place]atin, RPH-CPP   7 months ago Ischemic cardiomyopathy   [place], MD   10 months ago Lumbar radiculopathy   [place], MD       Future Appointments             In 6 days Ladell Pier, MD [place]

## 2021-06-06 NOTE — Telephone Encounter (Signed)
Pt requesting an update on medication refill.   Please advise.

## 2021-06-07 ENCOUNTER — Other Ambulatory Visit: Payer: Self-pay

## 2021-06-07 DIAGNOSIS — H2512 Age-related nuclear cataract, left eye: Secondary | ICD-10-CM | POA: Diagnosis not present

## 2021-06-07 DIAGNOSIS — M5416 Radiculopathy, lumbar region: Secondary | ICD-10-CM

## 2021-06-07 DIAGNOSIS — H25012 Cortical age-related cataract, left eye: Secondary | ICD-10-CM | POA: Diagnosis not present

## 2021-06-07 MED ORDER — GABAPENTIN 300 MG PO CAPS: ORAL_CAPSULE | ORAL | 3 refills | Status: DC

## 2021-06-07 NOTE — Telephone Encounter (Signed)
Copied from [place] [id number]. Topic: General - Other >> Jun 07, 2021 10:21 AM Yvette Rack wrote: Reason for CRM: Pt called for update on refill request for gabapentin (NEURONTIN) 300 MG capsule. Pt stated he has to go over to [facility] today and he would like to pick up the Rx while there.

## 2021-06-07 NOTE — Telephone Encounter (Signed)
Will route request to patient's PCP.

## 2021-06-12 ENCOUNTER — Ambulatory Visit: Payer: Medicare HMO | Attending: Internal Medicine | Admitting: Internal Medicine

## 2021-06-12 ENCOUNTER — Ambulatory Visit (INDEPENDENT_AMBULATORY_CARE_PROVIDER_SITE_OTHER): Payer: Medicare HMO

## 2021-06-12 ENCOUNTER — Encounter: Payer: Self-pay | Admitting: Internal Medicine

## 2021-06-12 DIAGNOSIS — I10 Essential (primary) hypertension: Secondary | ICD-10-CM | POA: Diagnosis not present

## 2021-06-12 DIAGNOSIS — I519 Heart disease, unspecified: Secondary | ICD-10-CM

## 2021-06-12 DIAGNOSIS — R7303 Prediabetes: Secondary | ICD-10-CM

## 2021-06-12 DIAGNOSIS — I25810 Atherosclerosis of coronary artery bypass graft(s) without angina pectoris: Secondary | ICD-10-CM

## 2021-06-12 DIAGNOSIS — Z95810 Presence of automatic (implantable) cardiac defibrillator: Secondary | ICD-10-CM | POA: Diagnosis not present

## 2021-06-12 DIAGNOSIS — E669 Obesity, unspecified: Secondary | ICD-10-CM | POA: Diagnosis not present

## 2021-06-12 DIAGNOSIS — Z23 Encounter for immunization: Secondary | ICD-10-CM

## 2021-06-12 DIAGNOSIS — I5022 Chronic systolic (congestive) heart failure: Secondary | ICD-10-CM | POA: Diagnosis not present

## 2021-06-12 NOTE — Progress Notes (Signed)
Patient ID: [name], male   DOB: 13-Nov-1960, 61 y.o.   MRN: [id number] Virtual Visit via Telephone Note  I connected with [name] on 06/12/2021 at 1:42 p.m by telephone and verified that I am speaking with the correct person using two identifiers  Location: Patient: home Provider: office  Participants: Myself Patient   I discussed the limitations, risks, security and privacy concerns of performing an evaluation and management service by telephone and the availability of in person appointments. I also discussed with the patient that there may be a patient responsible charge related to this service. The patient expressed understanding and agreed to proceed.   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  Patient was last seen by me 01/2021.  Purpose of today's visit is chronic disease management.  HYPERTENSION/CAD/CHF/ICD Currently taking: see medication list:   He is on aspirin, atorvastatin, carvedilol, Farxiga, Entresto, furosemide, spironolactone. Med Adherence: [x]  Yes    []  No Medication side effects: []  Yes    [x]  No Adherence with salt restriction: [x]  Yes    []  No Home Monitoring?: [x]  Yes  but not often  Monitoring Frequency:  Home BP results range:  SOB? []  Yes    [x]  No Chest Pain?: []  Yes    [x]  No Leg swelling?: []  Yes    [x]  No Headaches?: []  Yes    [x]  No Dizziness? []  Yes    [x]  No Comments: no firing of ICD  PreDM/Obesity:  purchased a [employer]aver Mini an exercise vibration board.  Just started using it the past 2 days. Has enjoyed using so far.  Plans to use it starting at 10 mins a day.  -Referred to nutritionist on last visit.  Reports he did not get to see the nutritionist but "I stopped eating all the junk foods."  -wgh was down 10 lbs from 02/2021 when he saw cardiology PA  05/02/2021.  BMI was 32  Had cataract extraction RT 2 wks ago and LT eye last wk.  Doing well  HM:  he forgot to get the 2nd shingles shot from rxn given on last visit.  Now that I have reminded him, he plans to get it done. Outpatient Encounter Medications as of 06/12/2021  Medication Sig   ALPHAGAN P 0.1 % SOLN INSTILL 1 DROP INTO BOTH EYES TWICE A DAY   aspirin 81 MG EC tablet Take 1 tablet (81 mg total) by mouth daily. Restart on 07/05/2018   atorvastatin (LIPITOR) 80 MG tablet TAKE 1 TABLET EVERY DAY   carvedilol (COREG) 25 MG tablet TAKE 1 TABLET BY MOUTH TWICE A DAY WITH A MEAL   Cholecalciferol (VITAMIN D) 125 MCG (5000 UT) CAPS Take 5,000 Units by mouth daily.   dapagliflozin propanediol (FARXIGA) 10 MG TABS tablet Take 1 tablet (10 mg total) by mouth daily.   ENTRESTO 97-103 MG TAKE 1 TABLET TWICE DAILY   furosemide (LASIX) 20 MG tablet Take 1 tablet (20 mg total) by mouth daily.   gabapentin (NEURONTIN) 300 MG capsule TAKE 1 CAPSULE BY MOUTH EVERYDAY AT BEDTIME   latanoprost (XALATAN) 0.005 % ophthalmic solution Place 1 drop into both eyes at bedtime.   methocarbamol (ROBAXIN) 500 MG tablet TAKE 1 TABLET (500 MG TOTAL) BY MOUTH 2 (TWO) TIMES DAILY AS NEEDED FOR MUSCLE SPASMS.   Multiple Vitamin (MULTIVITAMIN WITH MINERALS) TABS tablet Take  1 tablet by mouth daily. Centrum Silver   nicotine (NICODERM CQ - DOSED IN MG/24 HR) 7 mg/24hr patch Place 1 patch (7 mg total) onto the skin daily.   nitroGLYCERIN (NITROSTAT) 0.4 MG SL tablet Place 1 tablet (0.4 mg total) under the tongue every 5 (five) minutes x 3 doses as needed for chest pain.   spironolactone (ALDACTONE) 25 MG tablet TAKE 1 TABLET EVERY DAY   traMADol (ULTRAM) 50 MG tablet Take 1 tablet (50 mg total) by mouth every 8 (eight) hours as needed. (Patient taking differently: Take 50 mg by mouth every 8 (eight) hours as needed for moderate pain.)   No facility-administered encounter medications on file as of 06/12/2021.       Observations/Objective: No direct observation done as this was a telephone visit.   Chemistry      Component Value Date/Time   NA 139 05/25/2021 0835   K 4.6 05/25/2021 0835   CL 101 05/25/2021 0835   CO2 23 05/25/2021 0835   BUN 12 05/25/2021 0835   CREATININE 1.05 05/25/2021 0835   CREATININE 0.90 11/03/2015 1609      Component Value Date/Time   CALCIUM 9.5 05/25/2021 0835   ALKPHOS 147 (H) 05/25/2021 0835   AST 17 05/25/2021 0835   ALT 15 05/25/2021 0835   BILITOT 0.4 05/25/2021 0835     Lab Results  Component Value Date   WBC 5.4 03/06/2021   HGB 15.8 03/06/2021   HCT 45.4 03/06/2021   MCV 85 03/06/2021   PLT 178 03/06/2021   Lab Results  Component Value Date   CHOL 148 05/25/2021   HDL 56 05/25/2021   LDLCALC 72 05/25/2021   TRIG 112 05/25/2021   CHOLHDL 2.6 05/25/2021     Assessment and Plan: 1. Obesity (BMI 30.0-34.9) 2. Prediabetes Advised patient to be careful on his weight if many device.  Recommend that he set it up and use it in front of a slow fall or bed so that if he loses his balance while on it, he can fall on a soft surface.  3. Essential hypertension Controlled.  Continue current medications above.  4. Coronary artery disease involving coronary bypass graft of native heart without angina pectoris Stable.  Continue atorvastatin, carvedilol, aspirin  5. Chronic systolic dysfunction of left ventricle Compensated.  Continue guideline directed therapy including carvedilol, spironolactone, furosemide and Entresto  6. Need for shingles vaccine Patient plans to get his second shingles vaccine.   Follow Up Instructions: 4 months   I discussed the assessment and treatment plan with the patient. The patient was provided an opportunity to ask questions and all were answered. The patient agreed with the plan and demonstrated an understanding of the instructions.   The patient was advised to call back or seek an in-person evaluation if the symptoms  worsen or if the condition fails to improve as anticipated.  I  Spent 14 minutes on this telephone encounter  This note has been created with Surveyor, quantity. Any transcriptional errors are unintentional.  Karle Plumber, MD

## 2021-06-13 NOTE — Progress Notes (Signed)
EPIC Encounter for ICM Monitoring  Patient Name: [name] is a 61 y.o. male Date: 06/13/2021 Primary Care Physican: Ladell Pier, MD Primary Cardiologist: [place] Electrophysiologist: Allred 06/13/2021 Weight: 245 lbs                                                            Spoke with patient and heart failure questions reviewed.  Pt asymptomatic for fluid accumulation.   He has been eating restaurant foods which are typically high in salt.    CorVue thoracic impedance suggesting possible fluid accumulation starting 2/21.    Prescribed: Furosemide 20 mg take 1 tablet by mouth daily. Spironolactone 25 mg take 1 tablet daily   Labs: 05/25/2021 Creatinine 1.05, BUN 12, Potassium 4.6, Sodium 139, GFR 82 11/30/2020 Creatinine 1.07, BUN 14, Potassium 4.0, Sodium 138, GFR >60 09/10/2020 Creatinine 1.18, BUN 14, Potassium 4.3, Sodium 141, GFR >60  A complete set of results can be found in Results Review.   Recommendations:  Advised to take extra 1 tablet Furosemide x 2 days only and then return to prescribed dosage.  Advised to limit salt intake.   Follow-up plan: ICM clinic phone appointment on 06/21/2021 to recheck fluid levels.   91 day device clinic remote transmission 08/23/2021.     EP/Cardiology Office Visits:  08/28/2021 with Dr Rayann Heman.   Copy of ICM check sent to Dr. Rayann Heman and Dr [place] as Juluis Rainier and for review.   3 month ICM trend: 06/12/2021.    12-14 Month ICM trend:     Rosalene Billings, RN 06/13/2021 8:32 AM

## 2021-06-21 ENCOUNTER — Ambulatory Visit (INDEPENDENT_AMBULATORY_CARE_PROVIDER_SITE_OTHER): Payer: Medicare HMO

## 2021-06-21 DIAGNOSIS — I5022 Chronic systolic (congestive) heart failure: Secondary | ICD-10-CM

## 2021-06-21 DIAGNOSIS — Z95810 Presence of automatic (implantable) cardiac defibrillator: Secondary | ICD-10-CM

## 2021-06-21 NOTE — Progress Notes (Signed)
EPIC Encounter for ICM Monitoring ? ?Patient Name: [name] is a 61 y.o. male ?Date: 06/21/2021 ?Primary Care Physican: Ladell Pier, MD ?Primary Cardiologist: [place] ?Electrophysiologist: Allred ?06/13/2021 Weight: 245 lbs ?                                                         ?  ?Spoke with patient and heart failure questions reviewed.  Pt asymptomatic for fluid accumulation.   He has been eating restaurant foods which are typically high in salt.  ?  ?CorVue thoracic impedance suggesting possible fluid accumulation for past 16 days but returning close to baseline after taking extra  Furosemide x 2 days.  ?  ?Prescribed: ?Furosemide 20 mg take 1 tablet by mouth daily. ?Spironolactone 25 mg take 1 tablet daily ?  ?Labs: ?05/25/2021 Creatinine 1.05, BUN 12, Potassium 4.6, Sodium 139, GFR 82 ?11/30/2020 Creatinine 1.07, BUN 14, Potassium 4.0, Sodium 138, GFR >60 ?09/10/2020 Creatinine 1.18, BUN 14, Potassium 4.3, Sodium 141, GFR >60  ?A complete set of results can be found in Results Review. ?  ?Recommendations:  Advised to take extra 1 tablet Furosemide for additional day and then return to prescribed dosage.  Advised to limit salt intake. ?  ?Follow-up plan: ICM clinic phone appointment on 07/03/2021 to recheck fluid levels.   91 day device clinic remote transmission 08/23/2021.   ?  ?EP/Cardiology Office Visits:  08/28/2021 with Dr Rayann Heman. ?  ?Copy of ICM check sent to Dr. Rayann Heman.  ? ?3 month ICM trend: 06/21/2021. ? ? ? ?12-14 Month ICM trend:  ? ? ? ?Rosalene Billings, RN ?06/21/2021 ?11:13 AM ? ?

## 2021-06-22 DIAGNOSIS — R6889 Other general symptoms and signs: Secondary | ICD-10-CM | POA: Diagnosis not present

## 2021-07-03 ENCOUNTER — Ambulatory Visit (INDEPENDENT_AMBULATORY_CARE_PROVIDER_SITE_OTHER): Payer: Medicare HMO

## 2021-07-03 DIAGNOSIS — I5022 Chronic systolic (congestive) heart failure: Secondary | ICD-10-CM

## 2021-07-03 DIAGNOSIS — Z95810 Presence of automatic (implantable) cardiac defibrillator: Secondary | ICD-10-CM

## 2021-07-04 NOTE — Progress Notes (Signed)
EPIC Encounter for ICM Monitoring ? ?Patient Name: [name] is a 61 y.o. male ?Date: 07/04/2021 ?Primary Care Physican: Ladell Pier, MD ?Primary Cardiologist: [place] ?Electrophysiologist: Allred ?06/13/2021 Weight: 245 lbs ?                                                         ?  ?Spoke with patient and heart failure questions reviewed.  Pt asymptomatic for fluid accumulation.   ?  ?CorVue thoracic impedance suggesting fluid levels returned to normal after taking extra Furosemide.   ?  ?Prescribed: ?Furosemide 20 mg take 1 tablet by mouth daily. ?Spironolactone 25 mg take 1 tablet daily ?  ?Labs: ?05/25/2021 Creatinine 1.05, BUN 12, Potassium 4.6, Sodium 139, GFR 82 ?11/30/2020 Creatinine 1.07, BUN 14, Potassium 4.0, Sodium 138, GFR >60 ?09/10/2020 Creatinine 1.18, BUN 14, Potassium 4.3, Sodium 141, GFR >60  ?A complete set of results can be found in Results Review. ?  ?Recommendations:  No changes and encouraged to call if experiencing any fluid symptoms. ?  ?Follow-up plan: ICM clinic phone appointment on 07/31/2021.   91 day device clinic remote transmission 08/23/2021.   ?  ?EP/Cardiology Office Visits:  08/28/2021 with Dr Rayann Heman. ?  ?Copy of ICM check sent to Dr. Rayann Heman.  ? ?3 month ICM trend: 07/03/2021. ? ? ? ?12-14 Month ICM trend:  ? ? ? ?Rosalene Billings, RN ?07/04/2021 ?3:51 PM ? ?

## 2021-07-31 ENCOUNTER — Ambulatory Visit (INDEPENDENT_AMBULATORY_CARE_PROVIDER_SITE_OTHER): Payer: Medicare HMO

## 2021-07-31 DIAGNOSIS — Z95810 Presence of automatic (implantable) cardiac defibrillator: Secondary | ICD-10-CM

## 2021-07-31 DIAGNOSIS — I5022 Chronic systolic (congestive) heart failure: Secondary | ICD-10-CM

## 2021-08-04 DIAGNOSIS — H52223 Regular astigmatism, bilateral: Secondary | ICD-10-CM | POA: Diagnosis not present

## 2021-08-04 DIAGNOSIS — Z961 Presence of intraocular lens: Secondary | ICD-10-CM | POA: Diagnosis not present

## 2021-08-04 DIAGNOSIS — H524 Presbyopia: Secondary | ICD-10-CM | POA: Diagnosis not present

## 2021-08-04 NOTE — Progress Notes (Signed)
EPIC Encounter for ICM Monitoring ? ?Patient Name: [name] is a 60 y.o. male ?Date: 08/04/2021 ?Primary Care Physican: Ladell Pier, MD ?Primary Cardiologist: [place] ?Electrophysiologist: Allred ?06/13/2021 Weight: 245 lbs ?                                                         ?  ?Spoke with patient and heart failure questions reviewed.  Pt asymptomatic for fluid accumulation.   ?  ?CorVue thoracic impedance suggesting normal fluid levels.   ?  ?Prescribed: ?Furosemide 20 mg take 1 tablet by mouth daily. ?Spironolactone 25 mg take 1 tablet daily ?  ?Labs: ?05/25/2021 Creatinine 1.05, BUN 12, Potassium 4.6, Sodium 139, GFR 82 ?11/30/2020 Creatinine 1.07, BUN 14, Potassium 4.0, Sodium 138, GFR >60 ?09/10/2020 Creatinine 1.18, BUN 14, Potassium 4.3, Sodium 141, GFR >60  ?A complete set of results can be found in Results Review. ?  ?Recommendations:  No changes and encouraged to call if experiencing any fluid symptoms. ?  ?Follow-up plan: ICM clinic phone appointment on 09/06/2021.   91 day device clinic remote transmission 08/23/2021.   ?  ?EP/Cardiology Office Visits:  08/28/2021 with Dr Rayann Heman. ?  ?Copy of ICM check sent to Dr. Rayann Heman.  ?  ? ?3 month ICM trend: 07/31/2021. ? ? ? ?12-14 Month ICM trend:  ? ? ? ?Rosalene Billings, RN ?08/04/2021 ?3:12 PM ? ?

## 2021-08-11 ENCOUNTER — Ambulatory Visit: Payer: Self-pay

## 2021-08-11 ENCOUNTER — Telehealth: Payer: Self-pay | Admitting: Internal Medicine

## 2021-08-11 NOTE — Telephone Encounter (Signed)
?  Chief Complaint: Sore throat - Also loss of taste and smell ?Symptoms: stuffy runny nose ?Frequency: 2 weeks ?Pertinent Negatives: Patient denies Fever, SOB, cough ?Disposition: '[]'$ ED /'[x]'$ Urgent Care (no appt availability in office) / '[]'$ Appointment(In office/virtual)/ '[]'$  [facility]/ '[]'$ Home Care/ '[]'$ Refused Recommended Disposition /'[]'$ [facility]/ '[]'$  Follow-up with PCP ?Additional Notes: Pt has had a sore throat with loss of taste and smell for 2 weeks. Pt denies fever, cough, HA, BA. Pt will go to UC in the morning for care. Pt will seek care sooner if needed. ? ?Summary: loss of smell and taste, sore throat  ? Patient experiencing loss of smell, taste and sore throat for several weeks. Sent CRM to the practice due to patient requesting a referral to ENT   ?  ? ?Reason for Disposition ? [1] Sore throat is the only symptom AND [2] present > 48 hours ? ?Answer Assessment - Initial Assessment Questions ?1. ONSET: "When did the throat start hurting?" (Hours or days ago)  ?    2 weeks ?2. SEVERITY: "How bad is the sore throat?" (Scale 1-10; mild, moderate or severe) ?  - MILD (1-3):  doesn't interfere with eating or normal activities ?  - MODERATE (4-7): interferes with eating some solids and normal activities ?  - SEVERE (8-10):  excruciating pain, interferes with most normal activities ?  - SEVERE DYSPHAGIA: can't swallow liquids, drooling ?    4/10 ?3. STREP EXPOSURE: "Has there been any exposure to strep within the past week?" If Yes, ask: "What type of contact occurred?"  ?    no ?4.  VIRAL SYMPTOMS: "Are there any symptoms of a cold, such as a runny nose, cough, hoarse voice or red eyes?"  ?    Stuffy and runny nose. ?5. FEVER: "Do you have a fever?" If Yes, ask: "What is your temperature, how was it measured, and when did it start?" ?    No fever ?6. PUS ON THE TONSILS: "Is there pus on the tonsils in the back of your throat?" ?    no ?7. OTHER SYMPTOMS: "Do you have any other symptoms?"  (e.g., difficulty breathing, headache, rash) ?    na ?8. PREGNANCY: "Is there any chance you are pregnant?" "When was your last menstrual period?" ?    na ? ?Protocols used: Sore Throat-A-AH ? ?

## 2021-08-11 NOTE — Telephone Encounter (Signed)
Noted-  ?Patient prefers to go to UC for hands on rather than a virtual appt.  ? ?Asking for referral.  ?

## 2021-08-11 NOTE — Telephone Encounter (Signed)
Needs appt. Scheduled.   ?Okay to schedule virtual apt with Dr. Wynetta Emery 0810 slot.  ?

## 2021-08-11 NOTE — Telephone Encounter (Signed)
Copied from [place]. Topic: Referral - Request for Referral ?>> Aug 11, 2021  4:27 PM Oneta Rack wrote: ?Patient requesting a referral to  ENT due to lost of smell, taste and sore throat for several weeks. Also sent message to [facility] NT for clinical advice regarding symptoms. Patient requesting a call back regarding referral ?

## 2021-08-12 ENCOUNTER — Ambulatory Visit (HOSPITAL_COMMUNITY)
Admission: EM | Admit: 2021-08-12 | Discharge: 2021-08-12 | Disposition: A | Discharge status: Home / Self Care | Payer: Medicare HMO | Attending: Internal Medicine | Admitting: Internal Medicine

## 2021-08-12 DIAGNOSIS — J301 Allergic rhinitis due to pollen: Secondary | ICD-10-CM | POA: Diagnosis not present

## 2021-08-12 DIAGNOSIS — Z20822 Contact with and (suspected) exposure to covid-19: Secondary | ICD-10-CM | POA: Diagnosis not present

## 2021-08-12 MED ORDER — BENZONATATE 100 MG PO CAPS: 100.0000 mg | ORAL_CAPSULE | Freq: Three times a day (TID) | ORAL | 0 refills | Status: DC

## 2021-08-12 MED ORDER — FLUTICASONE PROPIONATE 50 MCG/ACT NA SUSP: 1.0000 | Freq: Every day | NASAL | 2 refills | Status: AC

## 2021-08-12 MED ORDER — CETIRIZINE HCL 10 MG PO TABS: 10.0000 mg | ORAL_TABLET | Freq: Every day | ORAL | 1 refills | Status: DC

## 2021-08-12 NOTE — ED Triage Notes (Signed)
C/o sinus pressure, nasal congestion and cough x 2 weeks. ?He does report loss of taste.  ?

## 2021-08-12 NOTE — ED Provider Notes (Signed)
?[facility] ? ? ? ?CSN: [id number] ?Arrival date & time: 08/12/21  1459 ? ? ?  ? ?History   ?Chief Complaint ?Chief Complaint  ?Patient presents with  ? Nasal Congestion  ? Cough  ? ? ?HPI ?[name] is a 61 y.o. male.  ? ?Patient presents to urgent care for evaluation of nasal congestion, sore throat, and cough for 2 weeks. Cough is dry and he states that it "really isn't that bad." He also reports a "slight loss of taste and smell." Reports it hurts to swallow and currently rates his throat pain at a 4 on a scale of 0-10. Denies headache, nausea, vomiting, diarrhea, constipation, chest pain, dizziness, and fever. When he touches his nose, it feels sore from the nasal congestion. His mucous is yellow. Denies taking medications for his symptoms. He is a current smoker. Denies wheezing. Does report slight shortness of breath "if he is exerting himself."  Denies sick contacts.  He states that he does not feel as though the mucus is in his chest, it feels like it is right in his throat.  Denies history of seasonal allergies and recent antibiotic use.  Denies any other aggravating or relieving factors. ? ? ?Cough ? ?Past Medical History:  ?Diagnosis Date  ? Abnormal EKG   ? hx of ischemia showing up on ekg's  ? Acute myopericarditis   ? a. 03/2013: readm with hypoxia, tachycardia, elevated ESR/CRP, elevated troponin with Dressler syndrome and myopericarditis; treated with steroids.  ? Acute respiratory failure ([place])   ? a. 01/2013: VDRF. b. 03/2013: hypoxia requiring supp O2 during adm, resolved by discharge.  ? C. difficile colitis   ? a. 01/2013 during prolonged adm. b. Recurred 03/2013.  ? Cardiac tamponade   ? a. 01/2013 s/p drain.  ? Cataracts, bilateral   ? Coronary artery disease   ? a. s/p MI in 2009 in [place] with stenting of the LCX and LAD;  b. 12/2012 NSTEMI/CAD: LM nl, LAD patent prox stent, LCX 50-70 isr (FFR 0.84), RCA dom, 155m EF 40-45%-->Med Rx. c. 01/2013: anterolateral STEMI complicated  by pericardial effusion (presumed purulent pericarditis) and tamponade s/p drain, ruptured LV pseudoaneurysm s/p CorMatrix patch, CABGx1 (SVG-OM1), fever, CVA, VDRF, C diff.  ? CVA (cerebral infarction)   ? a. 01/2013 in setting of prolonged hospitalization - residual L arm weakness.  ? [place]ler [place]   ? a. 03/2013: readm with hypoxia, tachycardia, elevated ESR/CRP, elevated troponin with Dressler syndrome and myopericarditis; treated with steroids.  ? Glaucoma   ? Hyperlipidemia   ? Hypokalemia   ? Hyponatremia   ? a. During late 2014.  ? Ischemic cardiomyopathy   ? a. Sept/Oct 2014: EF ~40% (ICM). b. 03/2013: EF 25-30%. Off ACEI due to hypotension (MIXED NICM/ICM).  ? Optic neuropathy, ischemic   ? Pericardial effusion   ? a. 01/2013: pericardial effusion (presumed purulent pericarditis) and cardiac tamponade s/p drain. b. Persistent moderate pericardial effusion 03/2013.  ? Pre-diabetes   ? Pseudoaneurysm of left ventricle of heart   ? a. 01/2013:  Ruptured inferoposterior LV pseudoaneurysm s/p CorMatrix patch.  ? Sinus bradycardia   ? asymptomatic  ? Stroke ([facility] 2009  ? weak lt side-lt arm  ? Tobacco abuse   ? Wears dentures   ? top  ? ? ?Patient Active Problem List  ? Diagnosis Date Noted  ? Rectal bleed 03/06/2021  ? Unstable angina (HCC)   ? Hx of CABG 07/09/2019  ? Lumbar radiculopathy 12/04/2018  ?  Glaucoma 12/04/2018  ? Cortical age-related cataract of both eyes 12/04/2018  ? Pain management contract agreement 12/04/2018  ? Lumbar herniated disc 06/04/2018  ? Prediabetes 01/01/2018  ? Hyperlipidemia 12/30/2017  ? Obesity (BMI 30.0-34.9) 12/30/2017  ? Benign neoplasm of descending colon   ? Essential hypertension 11/03/2015  ? Cardiomyopathy, ischemic 06/03/2015  ? Chronic systolic dysfunction of left ventricle 06/03/2015  ? Erectile dysfunction due to arterial insufficiency 12/23/2014  ? Lipoma of skin and subcutaneous tissue of neck 05/24/2014  ? History of cardioembolic stroke 09/31/1216  ?  Vision loss of right eye 06/23/2013  ? Dyslipidemia 06/22/2013  ? 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)   ? Coronary artery disease   ? Cardiomyopathy ([place])   ? Pseudoaneurysm of left ventricle of heart   ? Tobacco abuse   ? ? ?Past Surgical History:  ?Procedure Laterality Date  ? CARDIAC CATHETERIZATION  01/06/2013  ? CATARACT EXTRACTION Bilateral 05/2021  ? COLONOSCOPY WITH PROPOFOL N/A 07/03/2016  ? Procedure: COLONOSCOPY WITH PROPOFOL;  Surgeon: Manus Gunning, MD;  Location: [facility];  Service: Gastroenterology;  Laterality: N/A;  ? CORONARY ANGIOPLASTY WITH STENT PLACEMENT  2000's X 2  ? "1 + 1" (01/06/2013)  ? CORONARY ARTERY BYPASS GRAFT N/A 01/28/2013  ? Procedure: CORONARY ARTERY BYPASS GRAFTING (CABG);  Surgeon: Melrose Nakayama, MD;  Location: [place];  Service: Open Heart Surgery;  Laterality: N/A;  CABG x one, using left greater saphenous vein harvested endoscopically  ? ICD IMPLANT N/A 05/25/2020  ? Procedure: ICD IMPLANT;  Surgeon: Thompson Grayer, MD;  Location: [facility] CV [facility];  Service: Cardiovascular;  Laterality: N/A;  ? INTRAOPERATIVE TRANSESOPHAGEAL ECHOCARDIOGRAM N/A 01/28/2013  ? Procedure: INTRAOPERATIVE TRANSESOPHAGEAL ECHOCARDIOGRAM;  Surgeon: Melrose Nakayama, MD;  Location: [place];  Service: Open Heart Surgery;  Laterality: N/A;  ? LEFT HEART CATH AND CORS/GRAFTS ANGIOGRAPHY N/A 09/13/2020  ? Procedure: LEFT HEART CATH AND CORS/GRAFTS ANGIOGRAPHY;  Surgeon: Martinique, Peter M, MD;  Location: [place] CV LAB;  Service: Cardiovascular;  Laterality: N/A;  ? LEFT HEART CATHETERIZATION WITH CORONARY ANGIOGRAM N/A 01/06/2013  ? Procedure: LEFT HEART CATHETERIZATION WITH CORONARY ANGIOGRAM;  Surgeon: Peter M Martinique, MD;  Location: [facility];  Service: Cardiovascular;  Laterality: N/A;  ? LEFT HEART CATHETERIZATION WITH CORONARY ANGIOGRAM N/A 01/21/2013  ? Procedure: LEFT HEART CATHETERIZATION WITH CORONARY ANGIOGRAM;  Surgeon: Burnell Blanks, MD;  Location: [facility] CATH LAB;  Service:  Cardiovascular;  Laterality: N/A;  ? LUMBAR LAMINECTOMY/ DECOMPRESSION WITH MET-RX Right 06/30/2018  ? Procedure: Right minimally invasive Lumbar Three-Four Far lateral discectomy;  Surgeon: Judith Part, MD;  Location: [place];  Service: Neurosurgery;  Laterality: Right;  Right minimally invasive Lumbar Three-Four Far lateral discectomy  ? MASS EXCISION Right 07/21/2014  ? Procedure: EXCISION RIGHT NECK MASS;  Surgeon: Erroll Luna, MD;  Location: [place];  Service: General;  Laterality: Right;  ? PERICARDIAL TAP N/A 01/24/2013  ? Procedure: PERICARDIAL TAP;  Surgeon: Blane Ohara, MD;  Location: [facility] CATH LAB;  Service: Cardiovascular;  Laterality: N/A;  ? RIGHT HEART CATHETERIZATION Right 01/24/2013  ? Procedure: RIGHT HEART CATH;  Surgeon: Blane Ohara, MD;  Location: [facility] CATH LAB;  Service: Cardiovascular;  Laterality: Right;  ? VENTRICULAR ANEURYSM RESECTION N/A 01/28/2013  ? Procedure: LEFT VENTRICULAR ANEURYSM REPAIR;  Surgeon: Melrose Nakayama, MD;  Location: [place];  Service: Open Heart Surgery;  Laterality: N/A;  ? ? ? ? ? ?Home Medications   ? ?Prior to Admission medications   ?  Medication Sig Start Date End Date Taking? Authorizing Provider  ?benzonatate (TESSALON) 100 MG capsule Take 1 capsule (100 mg total) by mouth every 8 (eight) hours. 08/12/21  Yes Talbot Grumbling, FNP  ?cetirizine (ZYRTEC) 10 MG tablet Take 1 tablet (10 mg total) by mouth daily. 08/12/21  Yes Talbot Grumbling, FNP  ?fluticasone (FLONASE) 50 MCG/ACT nasal spray Place 1 spray into both nostrils daily. 08/12/21  Yes Talbot Grumbling, FNP  ?ALPHAGAN P 0.1 % SOLN INSTILL 1 DROP INTO BOTH EYES TWICE A DAY 10/26/19   Ladell Pier, MD  ?aspirin 81 MG EC tablet Take 1 tablet (81 mg total) by mouth daily. Restart on 07/05/2018 06/30/18   Judith Part, MD  ?atorvastatin (LIPITOR) 80 MG tablet TAKE 1 TABLET EVERY DAY 03/06/21   Ladell Pier, MD  ?carvedilol (COREG) 25 MG tablet  TAKE 1 TABLET BY MOUTH TWICE A DAY WITH A MEAL 05/03/21   Martinique, Peter M, MD  ?Cholecalciferol (VITAMIN D) 125 MCG (5000 UT) CAPS Take 5,000 Units by mouth daily.    [provider]  ?dapaglifloz

## 2021-08-12 NOTE — Discharge Instructions (Signed)
Please take medications as prescribed for your symptoms.  I believe your symptoms are caused by seasonal allergies.  I am reassured by your physical exam today and believe that these medications will help you greatly.  Take the cetirizine daily to help dry up your nasal mucus and help your cough.  Take the Flonase nasal spray 1 puff into each nostril daily to also help with your nasal congestion and nose pain/inflammation.  You may take the [facility] as needed for cough that is caused by your itchy throat.  Please continue to drink plenty of water and stay hydrated. ? ?If you develop any new or worsening symptoms, please return to urgent care.  If your symptoms are severe, please go to the emergency room.  Otherwise, follow-up with your primary care provider for further evaluation and management of your symptoms as well as ongoing wellness visits. ? ?I hope you feel better! ?

## 2021-08-13 LAB — SARS CORONAVIRUS 2 (TAT 6-24 HRS): SARS Coronavirus 2: NEGATIVE

## 2021-08-23 ENCOUNTER — Ambulatory Visit (INDEPENDENT_AMBULATORY_CARE_PROVIDER_SITE_OTHER): Payer: Medicare HMO

## 2021-08-23 DIAGNOSIS — I5022 Chronic systolic (congestive) heart failure: Secondary | ICD-10-CM

## 2021-08-24 LAB — CUP PACEART REMOTE DEVICE CHECK
Battery Remaining Longevity: 103 mo
Battery Remaining Percentage: 88 %
Battery Voltage: 3.01 V
Brady Statistic RV Percent Paced: 1 %
Date Time Interrogation Session: 20230509021109
HighPow Impedance: 66 Ohm
Implantable Lead Implant Date: 20220209
Implantable Lead Location: 753860
Implantable Pulse Generator Implant Date: 20220209
Lead Channel Impedance Value: 360 Ohm
Lead Channel Pacing Threshold Amplitude: 2.375 V
Lead Channel Pacing Threshold Pulse Width: 0.5 ms
Lead Channel Sensing Intrinsic Amplitude: 5.6 mV
Lead Channel Setting Pacing Amplitude: 2.625
Lead Channel Setting Pacing Pulse Width: 0.5 ms
Lead Channel Setting Sensing Sensitivity: 0.5 mV
Pulse Gen Serial Number: 111033765

## 2021-08-28 ENCOUNTER — Encounter: Payer: Medicare HMO | Admitting: Internal Medicine

## 2021-09-01 DIAGNOSIS — H47019 Ischemic optic neuropathy, unspecified eye: Secondary | ICD-10-CM | POA: Diagnosis not present

## 2021-09-01 DIAGNOSIS — F17210 Nicotine dependence, cigarettes, uncomplicated: Secondary | ICD-10-CM | POA: Diagnosis not present

## 2021-09-01 DIAGNOSIS — I25810 Atherosclerosis of coronary artery bypass graft(s) without angina pectoris: Secondary | ICD-10-CM | POA: Diagnosis not present

## 2021-09-01 DIAGNOSIS — G8929 Other chronic pain: Secondary | ICD-10-CM | POA: Diagnosis not present

## 2021-09-01 DIAGNOSIS — I10 Essential (primary) hypertension: Secondary | ICD-10-CM | POA: Diagnosis not present

## 2021-09-01 DIAGNOSIS — I700 Atherosclerosis of aorta: Secondary | ICD-10-CM | POA: Diagnosis not present

## 2021-09-01 DIAGNOSIS — I639 Cerebral infarction, unspecified: Secondary | ICD-10-CM | POA: Diagnosis not present

## 2021-09-01 DIAGNOSIS — I5022 Chronic systolic (congestive) heart failure: Secondary | ICD-10-CM | POA: Diagnosis not present

## 2021-09-01 DIAGNOSIS — M549 Dorsalgia, unspecified: Secondary | ICD-10-CM | POA: Diagnosis not present

## 2021-09-04 NOTE — Progress Notes (Signed)
Remote ICD transmission.   

## 2021-09-06 ENCOUNTER — Ambulatory Visit (INDEPENDENT_AMBULATORY_CARE_PROVIDER_SITE_OTHER): Payer: Medicare HMO

## 2021-09-06 DIAGNOSIS — I5022 Chronic systolic (congestive) heart failure: Secondary | ICD-10-CM | POA: Diagnosis not present

## 2021-09-06 DIAGNOSIS — Z95810 Presence of automatic (implantable) cardiac defibrillator: Secondary | ICD-10-CM | POA: Diagnosis not present

## 2021-09-07 ENCOUNTER — Telehealth: Payer: Self-pay | Admitting: Cardiology

## 2021-09-07 NOTE — Telephone Encounter (Signed)
Patient states his BP cuff stopped working and he would like to discuss getting a new one.

## 2021-09-07 NOTE — Telephone Encounter (Signed)
Attempted to contact patient to review concerns.  Left call back number.

## 2021-09-07 NOTE — Progress Notes (Signed)
EPIC Encounter for ICM Monitoring  Patient Name: [name] is a 61 y.o. male Date: 09/07/2021 Primary Care Physican: Ladell Pier, MD Primary Cardiologist: Martinique Electrophysiologist: Allred 06/13/2021 Weight: 245 lbs 09/07/2021 Weight: 243 lbs                                                            Spoke with patient and heart failure questions reviewed.  Pt asymptomatic for fluid accumulation.  Reports feeling well at this time and voices no complaints.    CorVue thoracic impedance suggesting normal fluid levels.     Prescribed: Furosemide 20 mg take 1 tablet by mouth daily. Spironolactone 25 mg take 1 tablet daily   Labs: 05/25/2021 Creatinine 1.05, BUN 12, Potassium 4.6, Sodium 139, GFR 82 11/30/2020 Creatinine 1.07, BUN 14, Potassium 4.0, Sodium 138, GFR >60 09/10/2020 Creatinine 1.18, BUN 14, Potassium 4.3, Sodium 141, GFR >60  A complete set of results can be found in Results Review.   Recommendations:  No changes and encouraged to call if experiencing any fluid symptoms.   Follow-up plan: ICM clinic phone appointment on 10/09/2021.   91 day device clinic remote transmission 11/22/2021.     EP/Cardiology Office Visits:  09/29/2021 with Dr Rayann Heman.   Copy of ICM check sent to Dr. Rayann Heman.   3 month ICM trend: 09/06/2021.    12-14 Month ICM trend:     Rosalene Billings, RN 09/07/2021 2:18 PM

## 2021-09-12 ENCOUNTER — Telehealth: Payer: Self-pay | Admitting: Cardiology

## 2021-09-12 NOTE — Telephone Encounter (Signed)
-  Pt state he BP cuff has stopped working and inquiring as to how he can get a new one. -Nurse informed pt we have placed a new machine up front for pick up.

## 2021-09-12 NOTE — Telephone Encounter (Signed)
Patient states his BP cuff stopped working and he would like to discuss getting a new one.

## 2021-09-13 ENCOUNTER — Telehealth: Payer: Self-pay | Admitting: Internal Medicine

## 2021-09-13 DIAGNOSIS — M5416 Radiculopathy, lumbar region: Secondary | ICD-10-CM

## 2021-09-13 NOTE — Telephone Encounter (Signed)
Copied from [place] [phone]. Topic: General - Other >> Sep 13, 2021 10:05 AM Loma [place] wrote: [phone] for CRM: Pt wanted to know adding some other meds to [facility] vs his other pharmacy contact, pls be aware/ [phone]

## 2021-09-18 MED ORDER — GABAPENTIN 300 MG PO CAPS: ORAL_CAPSULE | ORAL | 3 refills | Status: DC

## 2021-09-18 MED ORDER — GABAPENTIN 300 MG PO CAPS: 300.0000 mg | ORAL_CAPSULE | Freq: Every day | ORAL | 0 refills | Status: DC

## 2021-09-18 NOTE — Telephone Encounter (Signed)
Needing a medication refill on gabapentin. Please send to [facility] for future.  He states he has 3 pills left for gabapentin, pls send Rx to [facility] pharmacy.

## 2021-09-21 NOTE — Telephone Encounter (Signed)
See 09/12/21 telephone note

## 2021-09-29 ENCOUNTER — Encounter: Payer: Medicare HMO | Admitting: Internal Medicine

## 2021-09-29 DIAGNOSIS — Z0001 Encounter for general adult medical examination with abnormal findings: Secondary | ICD-10-CM | POA: Diagnosis not present

## 2021-09-29 DIAGNOSIS — E785 Hyperlipidemia, unspecified: Secondary | ICD-10-CM | POA: Diagnosis not present

## 2021-09-29 DIAGNOSIS — F17210 Nicotine dependence, cigarettes, uncomplicated: Secondary | ICD-10-CM | POA: Diagnosis not present

## 2021-09-29 DIAGNOSIS — F1290 Cannabis use, unspecified, uncomplicated: Secondary | ICD-10-CM | POA: Diagnosis not present

## 2021-09-29 DIAGNOSIS — G8929 Other chronic pain: Secondary | ICD-10-CM | POA: Diagnosis not present

## 2021-09-29 DIAGNOSIS — Z125 Encounter for screening for malignant neoplasm of prostate: Secondary | ICD-10-CM | POA: Diagnosis not present

## 2021-09-29 DIAGNOSIS — Z1159 Encounter for screening for other viral diseases: Secondary | ICD-10-CM | POA: Diagnosis not present

## 2021-09-29 DIAGNOSIS — Z79899 Other long term (current) drug therapy: Secondary | ICD-10-CM | POA: Diagnosis not present

## 2021-09-29 DIAGNOSIS — E669 Obesity, unspecified: Secondary | ICD-10-CM | POA: Diagnosis not present

## 2021-09-29 DIAGNOSIS — I700 Atherosclerosis of aorta: Secondary | ICD-10-CM | POA: Diagnosis not present

## 2021-09-29 NOTE — Progress Notes (Unsigned)
Cardiology Office Note:    Date:  09/29/2021   ID:  [name], DOB 06/10/1960, MRN [id number]  PCP:  Ladell Pier, MD   [place] [facility] Cardiologist:  Bryson Palen Martin[place], MD Electrophysiologist:  Thompson Grayer, MD     Referring MD: Ladell Pier, MD   No chief complaint on file.   History of Present Illness:    [name] is a 61 y.o. male with a hx of CAD s/p CABG 2014, ischemic cardiomyopathy s/p ICD, HTN, HLD, prediabetes, history of cardiac tamponade, pericarditis and history of CVA.  He had a stent to left circumflex and RCA in 2007.  Repeat cardiac catheterization in September 2014 revealed totally occluded RCA, 50% in-stent restenosis of the left circumflex stent, medical therapy was recommended.  He was admitted in October 2014 due to large pericardial effusion and tamponade due to ruptured LV pseudoaneurysm.  Pericardial drain was placed.  Unfortunately prior to the discharge, he developed signs of stroke with left arm weakness and left visual field deficit.  He then developed hemodynamic collapse and required intubation.  Repeat echocardiogram showed complex pericardial effusion with right ventricular collapse and tamponade.  He underwent emergent median sternotomy and repair of the ruptured inferoseptal posterior LV pseudoaneurysm with CorMatrix patch and CABG x1 with SVG to OM by Dr. Roxan Hockey.  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  Echocardiogram at the time was 25% with moderate pericardial effusion.  By February 2017, ejection fraction has improved to 30 to 35%.  Due to persistently low ejection fraction despite optimized medical therapy, he underwent ICD implantation by Dr. Rayann Heman on 05/25/2020.  He presented to the hospital in May 2020 with chest pain.  EKG was unchanged.  The first troponin was negative, however second troponin went up to 38.  Due to  low blood pressure, carvedilol and Entresto were held.  Subsequent echocardiogram obtained on 09/11/2020 showed EF 25 to 30%, grade 2 DD, trivial MR.  Repeat cardiac catheterization performed on 09/13/2020 showed chronically occluded proximal to mid RCA, 40% proximal LAD, 50% ostial to proximal left circumflex lesion, no SVG to OM was visualized and felt to be occluded, EF was normal.  Medical therapy was recommended.  He was placed on Farxiga as he had nausea and vomiting with Jardiance.  Carvedilol and Entresto were restarted prior to discharge.   Due to evidence of volume overload on CorVue, it was recommended for the patient to increase Lasix to 40 mg daily for 4 days before going back to 20 mg daily thereafter. Last seen in January by Almyra Deforest PA-C and doing well.   Patient presents today for follow-up.  He denies any worsening shortness of breath or chest discomfort.  He has no lower extremity edema.  His lung is clear.  Although he mentioned he had a chest cold a few weeks back, however this has resolved and his lung sounds good.  He is on appropriate cardiac medications.  He is overdue for blood work including CMP and a fasting lipid panel, this can be done on another day as he is not fasting today.  Overall, he is doing well and he can follow-up in 6 months.  Recent CorVue test in December 2022 showed normal volume.   Past Medical History:  Diagnosis Date   Abnormal EKG    hx of ischemia showing up on ekg's   Acute myopericarditis  a. 03/2013: readm with hypoxia, tachycardia, elevated ESR/CRP, elevated troponin with Dressler syndrome and myopericarditis; treated with steroids.   Acute respiratory failure ([place])    a. 01/2013: VDRF. b. 03/2013: hypoxia requiring supp O2 during adm, resolved by discharge.   C. difficile colitis    a. 01/2013 during prolonged adm. b. Recurred 03/2013.   Cardiac tamponade    a. 01/2013 s/p drain.   Cataracts, bilateral    Coronary artery disease    a. s/p MI  in 2009 in [place] with stenting of the LCX and LAD;  b. 12/2012 NSTEMI/CAD: LM nl, LAD patent prox stent, LCX 50-70 isr (FFR 0.84), RCA dom, 172m, EF 40-45%-->Med Rx. c. 01/2013: anterolateral STEMI complicated by pericardial effusion (presumed purulent pericarditis) and tamponade s/p drain, ruptured LV pseudoaneurysm s/p CorMatrix patch, CABGx1 (SVG-OM1), fever, CVA, VDRF, C diff.   CVA (cerebral infarction)    a. 01/2013 in setting of prolonged hospitalization - residual L arm weakness.   Dressler syndrome ([place])    a. 03/2013: readm with hypoxia, tachycardia, elevated ESR/CRP, elevated troponin with Dressler syndrome and myopericarditis; treated with steroids.   Glaucoma    Hyperlipidemia    Hypokalemia    Hyponatremia    a. During late 2014.   Ischemic cardiomyopathy    a. Sept/Oct 2014: EF ~40% (ICM). b. 03/2013: EF 25-30%. Off ACEI due to hypotension (MIXED NICM/ICM).   Optic neuropathy, ischemic    Pericardial effusion    a. 01/2013: pericardial effusion (presumed purulent pericarditis) and cardiac tamponade s/p drain. b. Persistent moderate pericardial effusion 03/2013.   Pre-diabetes    Pseudoaneurysm of left ventricle of heart    a. 01/2013:  Ruptured inferoposterior LV pseudoaneurysm s/p CorMatrix patch.   Sinus bradycardia    asymptomatic   Stroke [facility]) 2009   weak lt side-lt arm   Tobacco abuse    Wears dentures    top    Past Surgical History:  Procedure Laterality Date   CARDIAC CATHETERIZATION  01/06/2013   CATARACT EXTRACTION Bilateral 05/2021   COLONOSCOPY WITH PROPOFOL N/A 07/03/2016   Procedure: COLONOSCOPY WITH PROPOFOL;  Surgeon: Manus Gunning, MD;  Location: [facility];  Service: Gastroenterology;  Laterality: N/A;   CORONARY ANGIOPLASTY WITH STENT PLACEMENT  2000's X 2   "1 + 1" (01/06/2013)   CORONARY ARTERY BYPASS GRAFT N/A 01/28/2013   Procedure: CORONARY ARTERY BYPASS GRAFTING (CABG);  Surgeon: Melrose Nakayama, MD;  Location: [place];  Service:  Open Heart Surgery;  Laterality: N/A;  CABG x one, using left greater saphenous vein harvested endoscopically   ICD IMPLANT N/A 05/25/2020   Procedure: ICD IMPLANT;  Surgeon: Thompson Grayer, MD;  Location: [place] CV LAB;  Service: Cardiovascular;  Laterality: N/A;   INTRAOPERATIVE TRANSESOPHAGEAL ECHOCARDIOGRAM N/A 01/28/2013   Procedure: INTRAOPERATIVE TRANSESOPHAGEAL ECHOCARDIOGRAM;  Surgeon: Melrose Nakayama, MD;  Location: [place];  Service: Open Heart Surgery;  Laterality: N/A;   LEFT HEART CATH AND CORS/GRAFTS ANGIOGRAPHY N/A 09/13/2020   Procedure: LEFT HEART CATH AND CORS/GRAFTS ANGIOGRAPHY;  Surgeon: Martinique, Darcia Lampi M, MD;  Location: [place] CV LAB;  Service: Cardiovascular;  Laterality: N/A;   LEFT HEART CATHETERIZATION WITH CORONARY ANGIOGRAM N/A 01/06/2013   Procedure: LEFT HEART CATHETERIZATION WITH CORONARY ANGIOGRAM;  Surgeon: Marley Pakula M Martinique, MD;  Location[facility] C[facility] LAB;  Service: Cardiovascular;  Laterality: N/A;   LEFT HEART CATHETERIZATION WITH CORONARY ANGIOGRAM N/A 01/21/2013   Procedure: LEFT HEART CATHETERIZATION WITH CORONARY ANGIOGRAM;  Surgeon: Burnell Blanks, MD;  Location: [facility]  LAB;  Service: Cardiovascular;  Laterality: N/A;   LUMBAR LAMINECTOMY/ DECOMPRESSION WITH MET-RX Right 06/30/2018   Procedure: Right minimally invasive Lumbar Three-Four Far lateral discectomy;  Surgeon: Judith Part, MD;  Location: [place];  Service: Neurosurgery;  Laterality: Right;  Right minimally invasive Lumbar Three-Four Far lateral discectomy   MASS EXCISION Right 07/21/2014   Procedure: EXCISION RIGHT NECK MASS;  Surgeon: Erroll Luna, MD;  Location: [place];  Service: General;  Laterality: Right;   PERICARDIAL TAP N/A 01/24/2013   Procedure: PERICARDIAL TAP;  Surgeon: Blane Ohara, MD;  Location: [facility] CATH LAB;  Service: Cardiovascular;  Laterality: N/A;   RIGHT HEART CATHETERIZATION Right 01/24/2013   Procedure: RIGHT HEART CATH;  Surgeon:  Blane Ohara, MD;  Location: [facility] CATH LAB;  Service: Cardiovascular;  Laterality: Right;   VENTRICULAR ANEURYSM RESECTION N/A 01/28/2013   Procedure: LEFT VENTRICULAR ANEURYSM REPAIR;  Surgeon: Melrose Nakayama, MD;  Location: [place];  Service: Open Heart Surgery;  Laterality: N/A;    Current Medications: No outpatient medications have been marked as taking for the 10/09/21 encounter (Appointment) with Martinique, Emidio Warrell M, MD.     Allergies:   Patient has no known allergies.   Social History   Socioeconomic History   Marital status: Single    Spouse name: Not on file   Number of children: 2   Years of education: 12th   Highest education level: Not on file  Occupational History   Occupation: N/A  Tobacco Use   Smoking status: Every Day    Packs/day: 1.00    Years: 35.00    Total pack years: 35.00    Types: Cigarettes   Smokeless tobacco: Never  Vaping Use   Vaping Use: Never used  Substance and Sexual Activity   Alcohol use: No    Alcohol/week: 0.0 standard drinks of alcohol   Drug use: No   Sexual activity: Yes  Other Topics Concern   Not on file  Social History Narrative   Lives in [place] with wife.  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  He is now retired on disability.  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      Caffeine Use: very rarely   Social Determinants of Radio broadcast assistant Strain: Not on file  Food Insecurity: Not on file  Transportation Needs: Not on file  Physical Activity: Not on file  Stress: Not on file  Social Connections: Not on file     Family History: The patient's family history includes CAD in an other family member; Diabetes in his mother; HIV in his brother; Heart attack in his mother; Hypertension in his father and mother. There is no history of Stroke.  ROS:   Please see the history of present illness.     All other systems reviewed and are negative.  EKGs/Labs/Other Studies Reviewed:    The following  studies were reviewed today:  Echo 09/11/2020  1. Left ventricular ejection fraction, by estimation, is 25 to 30%. The  left ventricle has severely decreased function. The left ventricle  demonstrates regional wall motion abnormalities (see scoring  diagram/findings for description). Left ventricular  diastolic parameters are consistent with Grade II diastolic dysfunction  (pseudonormalization). Elevated left ventricular end-diastolic pressure.  There is of the left ventricular, . There is of the left ventricular,.   2. Right ventricular systolic function was not well visualized. The right  ventricular size is normal. Tricuspid regurgitation signal is  inadequate  for assessing PA pressure.   3. The mitral valve is grossly normal. Trivial mitral valve  regurgitation.   4. The aortic valve is tricuspid. Aortic valve regurgitation is not  visualized. No aortic stenosis is present.   5. The inferior vena cava is normal in size with <50% respiratory  variability, suggesting right atrial pressure of 8 mmHg.   Comparison(s): No significant change from prior study.    Cath 09/13/2020 Prox LAD lesion is 40% stenosed. Prox LAD to Mid LAD lesion is 10% stenosed. Prox Cx to Mid Cx lesion is 20% stenosed. Ost Cx to Prox Cx lesion is 50% stenosed. Prox RCA to Mid RCA lesion is 100% stenosed. SVG graft was not visualized due to known occlusion. Origin to Prox Graft lesion is 100% stenosed. LV end diastolic pressure is normal.   1. Single vessel occlusive CAD with CTO of the mid RCA.  2. Patent stents in the mid LAD and LCx.  3. No SVG to OM visualized but is felt to be occluded. 4. Low LVEDP 3 mm Hg   Plan: recommend continued medical management.    EKG:  EKG is not ordered today.   Recent Labs: 11/30/2020: B Natriuretic Peptide 144.6 03/06/2021: Hemoglobin 15.8; Platelets 178 05/25/2021: ALT 15; BUN 12; Creatinine, Ser 1.05; Potassium 4.6; Sodium 139  Recent Lipid Panel    Component  Value Date/Time   CHOL 148 05/25/2021 0835   TRIG 112 05/25/2021 0835   HDL 56 05/25/2021 0835   CHOLHDL 2.6 05/25/2021 0835   CHOLHDL 2.5 11/03/2015 1609   VLDL 19 11/03/2015 1609   LDLCALC 72 05/25/2021 0835     Risk Assessment/Calculations:           Physical Exam:    VS:  There were no vitals taken for this visit.    Wt Readings from Last 3 Encounters:  05/02/21 245 lb 9.6 oz (111.4 kg)  03/06/21 254 lb 3.2 oz (115.3 kg)  02/09/21 251 lb 3.2 oz (113.9 kg)     GEN:  Well nourished, well developed in no acute distress HEENT: Normal NECK: No JVD; No carotid bruits LYMPHATICS: No lymphadenopathy CARDIAC: RRR, no murmurs, rubs, gallops RESPIRATORY:  Clear to auscultation without rales, wheezing or rhonchi  ABDOMEN: Soft, non-tender, non-distended MUSCULOSKELETAL:  No edema; No deformity  SKIN: Warm and dry NEUROLOGIC:  Alert and oriented x 3 PSYCHIATRIC:  Normal affect   ASSESSMENT:    No diagnosis found.  PLAN:    In order of problems listed above:  Chronic systolic heart failure: Baseline EF 25 to 30% based on last echocardiogram.  He appears to be euvolemic on exam.  Recent CorVue study also appears to show his volume is normal.  On Farxiga, Entresto, carvedilol and spironolactone  Hyperlipidemia: Continue Lipitor  CAD: Denies any chest pain.  Continue aspirin  Hypertension: Blood pressure well controlled on current therapy  Prediabetes: Followed by primary care provider  History of CVA: No recent recurrence  History of ICD: Followed by Dr. Rayann Heman            Medication Adjustments/Labs and Tests Ordered: Current medicines are reviewed at length with the patient today.  Concerns regarding medicines are outlined above.  No orders of the defined types were placed in this encounter.  No orders of the defined types were placed in this encounter.   There are no Patient Instructions on file for this visit.   Signed, Elray Dains Martin[place], MD  09/29/2021  7:32 AM  [facility]

## 2021-09-30 ENCOUNTER — Other Ambulatory Visit: Payer: Self-pay | Admitting: Internal Medicine

## 2021-10-02 NOTE — Telephone Encounter (Signed)
Requested Prescriptions  Pending Prescriptions Disp Refills  . spironolactone (ALDACTONE) 25 MG tablet [Pharmacy Med Name: SPIRONOLACTONE 25 MG Tablet] 90 tablet 0    Sig: TAKE 1 TABLET EVERY DAY     Cardiovascular: Diuretics - Aldosterone Antagonist Failed - 09/30/2021  2:56 AM      Failed - Valid encounter within last 6 months    Recent Outpatient Visits          3 months ago Obesity (BMI 30.0-34.9)   [place] Ladell Pier, MD   7 months ago Rectal bleed   [place] Elsie Stain, MD   7 months ago Essential hypertension   [place], MD   11 months ago Need for shingles vaccine   [place], Stephen L, RPH-CPP   11 months ago Ischemic cardiomyopathy   [place], MD      Future Appointments            In 1 week [place], Ander Slade, MD [place], [facility]   In 2 weeks Ladell Pier, MD [place] in normal range and within 180 days    Creatinine  Date Value Ref Range Status  12/04/2018 139.0 20.0 - 300.0 mg/dL Final   Creat  Date Value Ref Range Status  11/03/2015 0.90 0.70 - 1.33 mg/dL Final    Comment:      For patients > or = 60 years of age: The upper reference limit for Creatinine is approximately 13% higher for people identified as African-American.      Creatinine, Ser  Date Value Ref Range Status  05/25/2021 1.05 0.76 - 1.27 mg/dL Final         Passed - K in normal range and within 180 days    Potassium  Date Value Ref Range Status  05/25/2021 4.6 3.5 - 5.2 mmol/L Final         Passed - Na in normal range and within 180 days    Sodium  Date Value Ref Range Status  05/25/2021 139 134 - 144 mmol/L Final         Passed - eGFR is 30 or above and within 180 days    GFR,  Est African American  Date Value Ref Range Status  11/03/2015 >89 >=60 mL/min Final   GFR calc Af Amer  Date Value Ref Range Status  05/06/2020 92 >59 mL/min/1.73 Final    Comment:    **In accordance with recommendations from the NKF-ASN Task force,**   [facility] is in the process of updating its eGFR calculation to the   2021 CKD-EPI creatinine equation that estimates kidney function   without a race variable.    GFR, Est Non African American  Date Value Ref Range Status  11/03/2015 >89 >=60 mL/min Final   GFR, Estimated  Date Value Ref Range Status  11/30/2020 >60 >60 mL/min Final    Comment:    (NOTE) Calculated using the CKD-EPI Creatinine Equation (2021)    GFR  Date Value Ref Range Status  09/06/2014 114.93 >60.00 mL/min Final   eGFR  Date Value Ref Range Status  05/25/2021 82 >59 mL/min/1.73 Final         Passed - Last BP in normal range  BP Readings from Last 1 Encounters:  08/12/21 116/80

## 2021-10-04 ENCOUNTER — Other Ambulatory Visit: Payer: Self-pay | Admitting: Cardiology

## 2021-10-09 ENCOUNTER — Ambulatory Visit (INDEPENDENT_AMBULATORY_CARE_PROVIDER_SITE_OTHER): Payer: Medicare HMO | Admitting: Cardiology

## 2021-10-09 ENCOUNTER — Encounter: Payer: Self-pay | Admitting: Cardiology

## 2021-10-09 VITALS — BP 90/70 | HR 57 | Ht 73.0 in | Wt 243.0 lb

## 2021-10-09 DIAGNOSIS — I5022 Chronic systolic (congestive) heart failure: Secondary | ICD-10-CM | POA: Diagnosis not present

## 2021-10-09 DIAGNOSIS — Z951 Presence of aortocoronary bypass graft: Secondary | ICD-10-CM | POA: Diagnosis not present

## 2021-10-09 DIAGNOSIS — Z95810 Presence of automatic (implantable) cardiac defibrillator: Secondary | ICD-10-CM

## 2021-10-09 DIAGNOSIS — I25810 Atherosclerosis of coronary artery bypass graft(s) without angina pectoris: Secondary | ICD-10-CM | POA: Diagnosis not present

## 2021-10-09 DIAGNOSIS — E785 Hyperlipidemia, unspecified: Secondary | ICD-10-CM

## 2021-10-09 DIAGNOSIS — I255 Ischemic cardiomyopathy: Secondary | ICD-10-CM

## 2021-10-09 DIAGNOSIS — R6889 Other general symptoms and signs: Secondary | ICD-10-CM | POA: Diagnosis not present

## 2021-10-11 ENCOUNTER — Other Ambulatory Visit: Payer: Self-pay | Admitting: Internal Medicine

## 2021-10-11 DIAGNOSIS — I25810 Atherosclerosis of coronary artery bypass graft(s) without angina pectoris: Secondary | ICD-10-CM

## 2021-10-11 DIAGNOSIS — E785 Hyperlipidemia, unspecified: Secondary | ICD-10-CM

## 2021-10-11 NOTE — Telephone Encounter (Signed)
Requested Prescriptions  Pending Prescriptions Disp Refills  . atorvastatin (LIPITOR) 80 MG tablet [Pharmacy Med Name: ATORVASTATIN CALCIUM 80 MG Tablet] 90 tablet 0    Sig: TAKE 1 TABLET EVERY DAY     Cardiovascular:  Antilipid - Statins Failed - 10/11/2021  4:05 AM      Failed - Lipid Panel in normal range within the last 12 months    Cholesterol, Total  Date Value Ref Range Status  05/25/2021 148 100 - 199 mg/dL Final   LDL Chol Calc (NIH)  Date Value Ref Range Status  05/25/2021 72 0 - 99 mg/dL Final   HDL  Date Value Ref Range Status  05/25/2021 56 >39 mg/dL Final   Triglycerides  Date Value Ref Range Status  05/25/2021 112 0 - 149 mg/dL Final         Passed - Patient is not pregnant      Passed - Valid encounter within last 12 months    Recent Outpatient Visits          4 months ago Obesity (BMI 30.0-34.9)   [place] [employer], MD   7 months ago Rectal bleed   [place], MD   8 months ago Essential hypertension   [place], MD   11 months ago Need for shingles vaccine   [place], RPH-CPP   1 year ago Ischemic cardiomyopathy   [place], MD      Future Appointments            In 1 week Ladell Pier, MD [place]   In 5 months [place], Ander Slade, MD [facility] [place], [facility]

## 2021-10-12 DIAGNOSIS — H401132 Primary open-angle glaucoma, bilateral, moderate stage: Secondary | ICD-10-CM | POA: Diagnosis not present

## 2021-10-12 DIAGNOSIS — Z961 Presence of intraocular lens: Secondary | ICD-10-CM | POA: Diagnosis not present

## 2021-10-18 ENCOUNTER — Ambulatory Visit (INDEPENDENT_AMBULATORY_CARE_PROVIDER_SITE_OTHER): Payer: Medicare HMO

## 2021-10-18 DIAGNOSIS — Z95810 Presence of automatic (implantable) cardiac defibrillator: Secondary | ICD-10-CM | POA: Diagnosis not present

## 2021-10-18 DIAGNOSIS — I5022 Chronic systolic (congestive) heart failure: Secondary | ICD-10-CM | POA: Diagnosis not present

## 2021-10-18 NOTE — Progress Notes (Signed)
EPIC Encounter for ICM Monitoring  Patient Name: [name] is a 61 y.o. male Date: 10/18/2021 Primary Care Physican: Loura Pardon, MD Primary Cardiologist: [place] Electrophysiologist: Allred 06/13/2021 Weight: 245 lbs 09/07/2021 Weight: 243 lbs 10/18/2021 Weight: 242 lbs                                                            Spoke with patient and heart failure questions reviewed.  Pt asymptomatic for fluid accumulation.  Reports feeling well at this time and voices no complaints.    CorVue thoracic impedance suggesting possible fluid accumulation starting 7/4.     Prescribed: Furosemide 20 mg take 1 tablet by mouth daily. Spironolactone 25 mg take 1 tablet daily   Labs: 05/25/2021 Creatinine 1.05, BUN 12, Potassium 4.6, Sodium 139, GFR 82 11/30/2020 Creatinine 1.07, BUN 14, Potassium 4.0, Sodium 138, GFR >60 09/10/2020 Creatinine 1.18, BUN 14, Potassium 4.3, Sodium 141, GFR >60  A complete set of results can be found in Results Review.   Recommendations:  Advised to take 1 extra Furosemide tablet x 1 day and then return to prescribed dosage.    Follow-up plan: ICM clinic phone appointment on 11/20/2021.   91 day device clinic remote transmission 11/22/2021.     EP/Cardiology Office Visits:  09/29/2021 with Dr Rayann Heman.   Copy of ICM check sent to Dr. Rayann Heman.    3 month ICM trend: 10/18/2021.    12-14 Month ICM trend:     Rosalene Billings, RN 10/18/2021 4:05 PM

## 2021-10-19 ENCOUNTER — Ambulatory Visit: Payer: Medicare HMO | Admitting: Internal Medicine

## 2021-11-20 ENCOUNTER — Ambulatory Visit (INDEPENDENT_AMBULATORY_CARE_PROVIDER_SITE_OTHER): Payer: Medicare HMO

## 2021-11-20 DIAGNOSIS — I5022 Chronic systolic (congestive) heart failure: Secondary | ICD-10-CM

## 2021-11-20 DIAGNOSIS — Z95810 Presence of automatic (implantable) cardiac defibrillator: Secondary | ICD-10-CM | POA: Diagnosis not present

## 2021-11-20 NOTE — Progress Notes (Signed)
EPIC Encounter for ICM Monitoring  Patient Name: [name] is a 61 y.o. male Date: 11/20/2021 Primary Care Physican: Loura Pardon, MD Primary Cardiologist: [place] Electrophysiologist: Allred 06/13/2021 Weight: 245 lbs 09/07/2021 Weight: 243 lbs 10/18/2021 Weight: 242 lbs 11/20/2021 Weight: 242 lbs                                                             Spoke with patient and heart failure questions reviewed.  Pt asymptomatic for fluid accumulation.  He reports he is probably drinking >64 oz daily.    CorVue thoracic impedance suggesting possible fluid accumulation starting 7/27.     Prescribed: Furosemide 20 mg take 1 tablet by mouth daily. Spironolactone 25 mg take 1 tablet daily   Labs: 05/25/2021 Creatinine 1.05, BUN 12, Potassium 4.6, Sodium 139, GFR 82 11/30/2020 Creatinine 1.07, BUN 14, Potassium 4.0, Sodium 138, GFR >60 09/10/2020 Creatinine 1.18, BUN 14, Potassium 4.3, Sodium 141, GFR >60  A complete set of results can be found in Results Review.   Recommendations:  *Advised to take extra Furosemide 20 mg x 2 days and then returned to prescribed dosage.  Advised to limit salt and fluid intake.    Follow-up plan: ICM clinic phone appointment on 11/27/2021 to recheck fluid levels.   91 day device clinic remote transmission 02/21/2022.     EP/Cardiology Office Visits:  12/01/2021 with Dr Rayann Heman.   Copy of ICM check sent to Dr. Rayann Heman and Dr Martinique for review.    3 month ICM trend: 11/20/2021.    12-14 Month ICM trend:     Rosalene Billings, RN 11/20/2021 2:39 PM

## 2021-11-22 ENCOUNTER — Ambulatory Visit (INDEPENDENT_AMBULATORY_CARE_PROVIDER_SITE_OTHER): Payer: Medicare HMO

## 2021-11-22 DIAGNOSIS — I255 Ischemic cardiomyopathy: Secondary | ICD-10-CM | POA: Diagnosis not present

## 2021-11-23 LAB — CUP PACEART REMOTE DEVICE CHECK
Battery Remaining Longevity: 100 mo
Battery Remaining Percentage: 85 %
Battery Voltage: 3.01 V
Brady Statistic RV Percent Paced: 1 %
Date Time Interrogation Session: 20230810122222
HighPow Impedance: 60 Ohm
Implantable Lead Implant Date: 20220209
Implantable Lead Location: 753860
Implantable Pulse Generator Implant Date: 20220209
Lead Channel Impedance Value: 360 Ohm
Lead Channel Pacing Threshold Amplitude: 2.375 V
Lead Channel Pacing Threshold Pulse Width: 0.5 ms
Lead Channel Sensing Intrinsic Amplitude: 5.5 mV
Lead Channel Setting Pacing Amplitude: 2.625
Lead Channel Setting Pacing Pulse Width: 0.5 ms
Lead Channel Setting Sensing Sensitivity: 0.5 mV
Pulse Gen Serial Number: 111033765

## 2021-11-27 ENCOUNTER — Ambulatory Visit (INDEPENDENT_AMBULATORY_CARE_PROVIDER_SITE_OTHER): Payer: Medicare HMO

## 2021-11-27 DIAGNOSIS — Z95810 Presence of automatic (implantable) cardiac defibrillator: Secondary | ICD-10-CM

## 2021-11-27 DIAGNOSIS — I5022 Chronic systolic (congestive) heart failure: Secondary | ICD-10-CM

## 2021-11-28 NOTE — Progress Notes (Signed)
EPIC Encounter for ICM Monitoring  Patient Name: [name] is a 61 y.o. male Date: 11/28/2021 Primary Care Physican: Loura Pardon, MD Primary Cardiologist: [place] Electrophysiologist: Allred 06/13/2021 Weight: 245 lbs 09/07/2021 Weight: 243 lbs 10/18/2021 Weight: 242 lbs 11/20/2021 Weight: 242 lbs 11/28/2021 Weight: 242 lbs                                                          Spoke with patient and heart failure questions reviewed.  Pt asymptomatic for fluid accumulation.     CorVue thoracic impedance suggesting fluid levels returned to normal after taking extra Furosemide x 2 days.     Prescribed: Furosemide 20 mg take 1 tablet by mouth daily. Spironolactone 25 mg take 1 tablet daily   Labs: 05/25/2021 Creatinine 1.05, BUN 12, Potassium 4.6, Sodium 139, GFR 82 11/30/2020 Creatinine 1.07, BUN 14, Potassium 4.0, Sodium 138, GFR >60 09/10/2020 Creatinine 1.18, BUN 14, Potassium 4.3, Sodium 141, GFR >60  A complete set of results can be found in Results Review.   Recommendations:  No changes and encouraged to call if experiencing any fluid symptoms.    Follow-up plan: ICM clinic phone appointment on 12/25/2021.   91 day device clinic remote transmission 02/21/2022.     EP/Cardiology Office Visits:  12/01/2021 with Dr Rayann Heman.   Copy of ICM check sent to Dr. Rayann Heman.    3 month ICM trend: 11/27/2021.    12-14 Month ICM trend:     Rosalene Billings, RN 11/28/2021 1:11 PM

## 2021-12-01 ENCOUNTER — Encounter: Payer: Self-pay | Admitting: Internal Medicine

## 2021-12-01 ENCOUNTER — Ambulatory Visit (INDEPENDENT_AMBULATORY_CARE_PROVIDER_SITE_OTHER): Payer: Medicare HMO | Admitting: Internal Medicine

## 2021-12-01 VITALS — BP 104/68 | HR 69 | Ht 73.0 in | Wt 242.0 lb

## 2021-12-01 DIAGNOSIS — Z95810 Presence of automatic (implantable) cardiac defibrillator: Secondary | ICD-10-CM | POA: Diagnosis not present

## 2021-12-01 DIAGNOSIS — I5022 Chronic systolic (congestive) heart failure: Secondary | ICD-10-CM | POA: Diagnosis not present

## 2021-12-01 DIAGNOSIS — I255 Ischemic cardiomyopathy: Secondary | ICD-10-CM

## 2021-12-01 LAB — CUP PACEART INCLINIC DEVICE CHECK
Date Time Interrogation Session: 20230818171040
Implantable Lead Implant Date: 20220209
Implantable Lead Location: 753860
Implantable Pulse Generator Implant Date: 20220209
Pulse Gen Serial Number: 111033765

## 2021-12-01 MED ORDER — FUROSEMIDE 20 MG PO TABS: 20.0000 mg | ORAL_TABLET | Freq: Every day | ORAL | 0 refills | Status: DC | PRN

## 2021-12-01 NOTE — Patient Instructions (Addendum)
Medication Instructions:  Your physician has recommended you make the following change in your medication:   LASIX 20 MG ( PRN )-  TAKE ONE TABLET BY MOUTH DAILY AS NEEDED FOR SWELLING.  Lab Work: None ordered.  If you have labs (blood work) drawn today and your tests are completely normal, you will receive your results only by: [place] (if you have MyChart) OR A paper copy in the mail If you have any lab test that is abnormal or we need to change your treatment, we will call you to review the results.  Testing/Procedures: None ordered.  Follow-Up:  IN 1 YEAR WITH ANDY TILLERY, PA-C  Remote monitoring is used to monitor your ICD from home. This monitoring reduces the number of office visits required to check your device to one time per year. It allows [place] to keep an eye on the functioning of your device to ensure it is working properly. You are scheduled for a device check from home on 12/25/21. You may send your transmission at any time that day. If you have a wireless device, the transmission will be sent automatically. After your physician reviews your transmission, you will receive a postcard with your next transmission date.  Important Information About [place]

## 2021-12-01 NOTE — Progress Notes (Signed)
PCP: Loura Pardon, MD Primary Cardiologist: Dr Martinique Primary EP: Dr Aileen Pilot is a 61 y.o. male who presents today for routine electrophysiology followup.  Since last being seen in our clinic, the patient reports doing very well.  Today, he denies symptoms of palpitations, chest pain, shortness of breath,  lower extremity edema, dizziness, presyncope, syncope, or ICD shocks.  The patient is otherwise without complaint today.   Past Medical History:  Diagnosis Date   Abnormal EKG    hx of ischemia showing up on ekg's   Acute myopericarditis    a. 03/2013: readm with hypoxia, tachycardia, elevated ESR/CRP, elevated troponin with Dressler syndrome and myopericarditis; treated with steroids.   Acute respiratory failure ([place])    a. 01/2013: VDRF. b. 03/2013: hypoxia requiring supp O2 during adm, resolved by discharge.   C. difficile colitis    a. 01/2013 during prolonged adm. b. Recurred 03/2013.   Cardiac tamponade    a. 01/2013 s/p drain.   Cataracts, bilateral    Coronary artery disease    a. s/p MI in 2009 in [place] with stenting of the LCX and LAD;  b. 12/2012 NSTEMI/CAD: LM nl, LAD patent prox stent, LCX 50-70 isr (FFR 0.84), RCA dom, 149m EF 40-45%-->Med Rx. c. 01/2013: anterolateral STEMI complicated by pericardial effusion (presumed purulent pericarditis) and tamponade s/p drain, ruptured LV pseudoaneurysm s/p CorMatrix patch, CABGx1 (SVG-OM1), fever, CVA, VDRF, C diff.   CVA (cerebral infarction)    a. 01/2013 in setting of prolonged hospitalization - residual L arm weakness.   Dressler syndrome ([place]    a. 03/2013: readm with hypoxia, tachycardia, elevated ESR/CRP, elevated troponin with Dressler syndrome and myopericarditis; treated with steroids.   Glaucoma    Hyperlipidemia    Hypokalemia    Hyponatremia    a. During late 2014.   Ischemic cardiomyopathy    a. Sept/Oct 2014: EF ~40% (ICM). b. 03/2013: EF 25-30%. Off ACEI due to hypotension (MIXED  NICM/ICM).   Optic neuropathy, ischemic    Pericardial effusion    a. 01/2013: pericardial effusion (presumed purulent pericarditis) and cardiac tamponade s/p drain. b. Persistent moderate pericardial effusion 03/2013.   Pre-diabetes    Pseudoaneurysm of left ventricle of heart    a. 01/2013:  Ruptured inferoposterior LV pseudoaneurysm s/p CorMatrix patch.   Sinus bradycardia    asymptomatic   Stroke ([facility] 2009   weak lt side-lt arm   Tobacco abuse    Wears dentures    top   Past Surgical History:  Procedure Laterality Date   CARDIAC CATHETERIZATION  01/06/2013   CATARACT EXTRACTION Bilateral 05/2021   COLONOSCOPY WITH PROPOFOL N/A 07/03/2016   Procedure: COLONOSCOPY WITH PROPOFOL;  Surgeon: SManus Gunning MD;  [facility];  Service: Gastroenterology;  Laterality: N/A;   CORONARY ANGIOPLASTY WITH STENT PLACEMENT  2000's X 2   "1 + 1" (01/06/2013)   CORONARY ARTERY BYPASS GRAFT N/A 01/28/2013   Procedure: CORONARY ARTERY BYPASS GRAFTING (CABG);  Surgeon: SMelrose Nakayama MD;  [place] [facility]  Service: Open Heart Surgery;  Laterality: N/A;  CABG x one, using left greater saphenous vein harvested endoscopically   ICD IMPLANT N/A 05/25/2020   Procedure: ICD IMPLANT;  Surgeon: AThompson Grayer MD;  [facility];  Service: Cardiovascular;  Laterality: N/A;   INTRAOPERATIVE TRANSESOPHAGEAL ECHOCARDIOGRAM N/A 01/28/2013   Procedure: INTRAOPERATIVE TRANSESOPHAGEAL ECHOCARDIOGRAM;  Surgeon: SMelrose Nakayama MD;  [place] [facility]  Service: Open Heart Surgery;  Laterality: N/A;  LEFT HEART CATH AND CORS/GRAFTS ANGIOGRAPHY N/A 09/13/2020   Procedure: LEFT HEART CATH AND CORS/GRAFTS ANGIOGRAPHY;  Surgeon: Martinique, Peter M, MD;  Location: [place] [facility];  Service: Cardiovascular;  Laterality: N/A;   LEFT HEART CATHETERIZATION WITH CORONARY ANGIOGRAM N/A 01/06/2013   Procedure: LEFT HEART CATHETERIZATION WITH CORONARY ANGIOGRAM;  Surgeon: Peter M  Martinique, MD;  Location: [facility] CATH LAB;  Service: Cardiovascular;  Laterality: N/A;   LEFT HEART CATHETERIZATION WITH CORONARY ANGIOGRAM N/A 01/21/2013   Procedure: LEFT HEART CATHETERIZATION WITH CORONARY ANGIOGRAM;  Surgeon: Burnell Blanks, MD;  Location: [facility] CATH LAB;  Service: Cardiovascular;  Laterality: N/A;   LUMBAR LAMINECTOMY/ DECOMPRESSION WITH MET-RX Right 06/30/2018   Procedure: Right minimally invasive Lumbar Three-Four Far lateral discectomy;  Surgeon: Judith Part, MD;  Location: [place];  Service: Neurosurgery;  Laterality: Right;  Right minimally invasive Lumbar Three-Four Far lateral discectomy   MASS EXCISION Right 07/21/2014   Procedure: EXCISION RIGHT NECK MASS;  Surgeon: Erroll Luna, MD;  Location: [place];  Service: General;  Laterality: Right;   PERICARDIAL TAP N/A 01/24/2013   Procedure: PERICARDIAL TAP;  Surgeon: Blane Ohara, MD;  Location[facility] CATH LAB;  Service: Cardiovascular;  Laterality: N/A;   RIGHT HEART CATHETERIZATION Right 01/24/2013   Procedure: RIGHT HEART CATH;  Surgeon: Blane Ohara, MD;  Location: [facility] CATH LAB;  Service: Cardiovascular;  Laterality: Right;   VENTRICULAR ANEURYSM RESECTION N/A 01/28/2013   Procedure: LEFT VENTRICULAR ANEURYSM REPAIR;  Surgeon: Melrose Nakayama, MD;  Location: [place];  Service: Open Heart Surgery;  Laterality: N/A;    ROS- all systems are reviewed and negative except as per HPI above  Current Outpatient Medications  Medication Sig Dispense Refill   ALPHAGAN P 0.1 % SOLN INSTILL 1 DROP INTO BOTH EYES TWICE A DAY 10 mL 0   aspirin 81 MG EC tablet Take 1 tablet (81 mg total) by mouth daily. Restart on 07/05/2018 90 tablet 3   atorvastatin (LIPITOR) 80 MG tablet TAKE 1 TABLET EVERY DAY 90 tablet 0   carvedilol (COREG) 25 MG tablet TAKE 1 TABLET BY MOUTH TWICE A DAY WITH A MEAL 180 tablet 3   cetirizine (ZYRTEC) 10 MG tablet Take 1 tablet (10 mg total) by mouth daily. 30 tablet 1    Cholecalciferol (VITAMIN D) 125 MCG (5000 UT) CAPS Take 5,000 Units by mouth daily.     dapagliflozin propanediol (FARXIGA) 10 MG TABS tablet TAKE 1 TABLET EVERY DAY 30 tablet 1   ENTRESTO 97-103 MG TAKE 1 TABLET TWICE DAILY 180 tablet 3   fluticasone (FLONASE) 50 MCG/ACT nasal spray Place 1 spray into both nostrils daily. 16 g 2   furosemide (LASIX) 20 MG tablet Take 1 tablet (20 mg total) by mouth daily. 90 tablet 3   gabapentin (NEURONTIN) 300 MG capsule Take 1 capsule (300 mg total) by mouth at bedtime. 30 capsule 0   latanoprost (XALATAN) 0.005 % ophthalmic solution Place 1 drop into both eyes at bedtime.  12   methocarbamol (ROBAXIN) 500 MG tablet TAKE 1 TABLET (500 MG TOTAL) BY MOUTH 2 (TWO) TIMES DAILY AS NEEDED FOR MUSCLE SPASMS. 30 tablet 2   Multiple Vitamin (MULTIVITAMIN WITH MINERALS) TABS tablet Take 1 tablet by mouth daily. Centrum Silver     nicotine (NICODERM CQ - DOSED IN MG/24 HR) 7 mg/24hr patch Place 1 patch (7 mg total) onto the skin daily. 28 patch 1   nitroGLYCERIN (NITROSTAT) 0.4 MG SL tablet Place 1 tablet (  0.4 mg total) under the tongue every 5 (five) minutes x 3 doses as needed for chest pain. 25 tablet 1   spironolactone (ALDACTONE) 25 MG tablet TAKE 1 TABLET EVERY DAY 90 tablet 0   traMADol (ULTRAM) 50 MG tablet Take 1 tablet (50 mg total) by mouth every 8 (eight) hours as needed. (Patient taking differently: Take 50 mg by mouth every 8 (eight) hours as needed for moderate pain.) 30 tablet 0   No current facility-administered medications for this visit.    Physical Exam: Vitals:   12/01/21 1412  BP: 104/68  Pulse: 69  SpO2: 93%  Weight: 242 lb (109.8 kg)  Height: 6' 1"  (1.854 m)    GEN- The patient is well appearing, alert and oriented x 3 today.   Head- normocephalic, atraumatic Eyes-  Sclera clear, conjunctiva pink Ears- hearing intact Oropharynx- clear Lungs- Clear to ausculation bilaterally, normal work of breathing Chest- ICD pocket is well  healed Heart- Regular rate and rhythm, no murmurs, rubs or gallops, PMI not laterally displaced GI- soft, NT, ND, + BS Extremities- no clubbing, cyanosis, or edema  ICD interrogation- reviewed in detail today,  See PACEART report   Wt Readings from Last 3 Encounters:  12/01/21 242 lb (109.8 kg)  10/09/21 243 lb (110.2 kg)  05/02/21 245 lb 9.6 oz (111.4 kg)    Assessment and Plan:  1.  Chronic systolic dysfunction/ CAD/ ischemic CM euvolemic today No ischemic symptoms Stable on an appropriate medical regimen Normal ICD function See [facility] report No changes today he is not device dependant today followed in ICM device clinic   Return to see EP APP In a year  Thompson Grayer MD, [facility] 12/01/2021 2:30 PM

## 2021-12-16 ENCOUNTER — Other Ambulatory Visit: Payer: Self-pay | Admitting: Internal Medicine

## 2021-12-20 ENCOUNTER — Other Ambulatory Visit: Payer: Self-pay | Admitting: Cardiology

## 2021-12-23 ENCOUNTER — Other Ambulatory Visit: Payer: Self-pay | Admitting: Cardiology

## 2021-12-25 ENCOUNTER — Ambulatory Visit (INDEPENDENT_AMBULATORY_CARE_PROVIDER_SITE_OTHER): Payer: Medicare HMO

## 2021-12-25 DIAGNOSIS — Z95810 Presence of automatic (implantable) cardiac defibrillator: Secondary | ICD-10-CM

## 2021-12-25 DIAGNOSIS — I5022 Chronic systolic (congestive) heart failure: Secondary | ICD-10-CM | POA: Diagnosis not present

## 2021-12-25 NOTE — Progress Notes (Signed)
EPIC Encounter for ICM Monitoring  Patient Name: [name] is a 61 y.o. male Date: 12/25/2021 Primary Care Physican: Loura Pardon, MD Primary Cardiologist: Martinique Electrophysiologist: Curt Bears 06/13/2021 Weight: 245 lbs 09/07/2021 Weight: 243 lbs 10/18/2021 Weight: 242 lbs 11/20/2021 Weight: 242 lbs 11/28/2021 Weight: 242 lbs                                                          Spoke with patient and heart failure questions reviewed.  Pt asymptomatic for fluid accumulation or dehydration.  Reports feeling well at this time and voices no complaints.  Discussed to drink about 64 oz fluid daily to stay hydrated.   CorVue thoracic impedance suggesting dryness starting 8/31.     Prescribed: Furosemide 20 mg take 1 tablet by mouth daily. Spironolactone 25 mg take 1 tablet daily   Labs: 05/25/2021 Creatinine 1.05, BUN 12, Potassium 4.6, Sodium 139, GFR 82 11/30/2020 Creatinine 1.07, BUN 14, Potassium 4.0, Sodium 138, GFR >60 09/10/2020 Creatinine 1.18, BUN 14, Potassium 4.3, Sodium 141, GFR >60  A complete set of results can be found in Results Review.   Recommendations:  Advised to drink approximately 80 oz fluid x 2 days due to dehydration.    Follow-up plan: ICM clinic phone appointment on 01/01/2022 to recheck fluid levels   91 day device clinic remote transmission 02/21/2022.     EP/Cardiology Office Visits:  03/19/2022 with Dr Martinique. Recall 11/26/2022 with Oda Kilts, PA.   Copy of ICM check sent to Dr. Curt Bears and Dr Martinique as Juluis Rainier.    3 month ICM trend: 12/25/2021.    12-14 Month ICM trend:     Rosalene Billings, RN 12/25/2021 7:53 AM

## 2021-12-26 NOTE — Progress Notes (Signed)
Remote ICD transmission.   

## 2022-01-01 ENCOUNTER — Ambulatory Visit (INDEPENDENT_AMBULATORY_CARE_PROVIDER_SITE_OTHER): Payer: Medicare HMO

## 2022-01-01 DIAGNOSIS — Z95810 Presence of automatic (implantable) cardiac defibrillator: Secondary | ICD-10-CM

## 2022-01-01 DIAGNOSIS — I5022 Chronic systolic (congestive) heart failure: Secondary | ICD-10-CM

## 2022-01-02 NOTE — Progress Notes (Signed)
EPIC Encounter for ICM Monitoring  Patient Name: [name] is a 61 y.o. male Date: 01/02/2022 Primary Care Physican: Loura Pardon, MD Primary Cardiologist: Martinique Electrophysiologist: Curt Bears 06/13/2021 Weight: 245 lbs 09/07/2021 Weight: 243 lbs 10/18/2021 Weight: 242 lbs 11/20/2021 Weight: 242 lbs 11/28/2021 Weight: 242 lbs                                                          Spoke with patient and heart failure questions reviewed.  Pt asymptomatic for fluid accumulation.  He increased fluid intake to meeting the recommended 64 oz daily.   CorVue thoracic impedance suggesting fluid levels returned to normal after possible dryness episode.     Prescribed: Furosemide 20 mg take 1 tablet by mouth daily. Spironolactone 25 mg take 1 tablet daily   Labs: 05/25/2021 Creatinine 1.05, BUN 12, Potassium 4.6, Sodium 139, GFR 82 11/30/2020 Creatinine 1.07, BUN 14, Potassium 4.0, Sodium 138, GFR >60 09/10/2020 Creatinine 1.18, BUN 14, Potassium 4.3, Sodium 141, GFR >60  A complete set of results can be found in Results Review.   Recommendations:  No changes and encouraged to call if experiencing any fluid symptoms.   Follow-up plan: ICM clinic phone appointment on 01/29/2022.   91 day device clinic remote transmission 02/21/2022.     EP/Cardiology Office Visits:  03/19/2022 with Dr Martinique. Recall 11/26/2022 with Oda Kilts, PA.   Copy of ICM check sent to Dr. Curt Bears.  3 month ICM trend: 01/02/2022.    12-14 Month ICM trend:     Rosalene Billings, RN 01/02/2022 8:29 AM

## 2022-01-11 ENCOUNTER — Other Ambulatory Visit: Payer: Self-pay | Admitting: Cardiology

## 2022-02-06 NOTE — Progress Notes (Signed)
No ICM remote transmission received for 01/29/2022 and next ICM transmission scheduled for 02/26/2022.   

## 2022-02-21 ENCOUNTER — Ambulatory Visit (INDEPENDENT_AMBULATORY_CARE_PROVIDER_SITE_OTHER): Payer: Medicare HMO

## 2022-02-21 DIAGNOSIS — I255 Ischemic cardiomyopathy: Secondary | ICD-10-CM | POA: Diagnosis not present

## 2022-02-22 ENCOUNTER — Telehealth: Payer: Self-pay | Admitting: Cardiology

## 2022-02-22 ENCOUNTER — Telehealth: Payer: Self-pay

## 2022-02-22 NOTE — Telephone Encounter (Signed)
Spoke with pt, aware will forward to the device nurses to contact him.

## 2022-02-22 NOTE — Telephone Encounter (Signed)
Patient stated he has been getting weekly updates on his fluid levels and he hasn't heard from anyone in the past couple of weeks.

## 2022-02-22 NOTE — Telephone Encounter (Signed)
The patient got a text message asking him to send a transmission. Then they told him they did not get it and he sent it two more times. Then the person text him asking how he going to pay for these transmissions. I let the patient know that we will not text him about his ICD transmission. We will not ask for money and will always tell the patient our name. I thanked him for letting me know that someone is trying to scam him.

## 2022-02-24 LAB — CUP PACEART REMOTE DEVICE CHECK
Battery Remaining Longevity: 98 mo
Battery Remaining Percentage: 83 %
Battery Voltage: 2.99 V
Brady Statistic RV Percent Paced: 1 %
Date Time Interrogation Session: 20231109155442
HighPow Impedance: 60 Ohm
Implantable Lead Connection Status: 753985
Implantable Lead Implant Date: 20220209
Implantable Lead Location: 753860
Implantable Pulse Generator Implant Date: 20220209
Lead Channel Impedance Value: 380 Ohm
Lead Channel Pacing Threshold Amplitude: 2.5 V
Lead Channel Pacing Threshold Pulse Width: 0.5 ms
Lead Channel Sensing Intrinsic Amplitude: 4.9 mV
Lead Channel Setting Pacing Amplitude: 2.625
Lead Channel Setting Pacing Pulse Width: 0.5 ms
Lead Channel Setting Sensing Sensitivity: 0.5 mV
Pulse Gen Serial Number: 111033765
Zone Setting Status: 755011

## 2022-02-25 ENCOUNTER — Other Ambulatory Visit: Payer: Self-pay | Admitting: Internal Medicine

## 2022-02-26 ENCOUNTER — Ambulatory Visit (INDEPENDENT_AMBULATORY_CARE_PROVIDER_SITE_OTHER): Payer: Medicare HMO

## 2022-02-26 DIAGNOSIS — I5022 Chronic systolic (congestive) heart failure: Secondary | ICD-10-CM

## 2022-02-26 DIAGNOSIS — Z95810 Presence of automatic (implantable) cardiac defibrillator: Secondary | ICD-10-CM | POA: Diagnosis not present

## 2022-02-26 NOTE — Telephone Encounter (Signed)
Requested medications are due for refill today.  yes  Requested medications are on the active medications list.  yes  Last refill. 10/02/2021 #90 0 rf  Future visit scheduled.   no  Notes to clinic.  Himanshu Paliwal listed as PCP.    Requested Prescriptions  Pending Prescriptions Disp Refills   spironolactone (ALDACTONE) 25 MG tablet [Pharmacy Med Name: SPIRONOLACTONE 25 MG Tablet] 90 tablet 10    Sig: TAKE 1 TABLET EVERY DAY     Cardiovascular: Diuretics - Aldosterone Antagonist Failed - 02/25/2022  3:25 AM      Failed - Cr in normal range and within 180 days    Creatinine  Date Value Ref Range Status  12/04/2018 139.0 20.0 - 300.0 mg/dL Final   Creat  Date Value Ref Range Status  11/03/2015 0.90 0.70 - 1.33 mg/dL Final    Comment:      For patients > or = 60 years of age: The upper reference limit for Creatinine is approximately 13% higher for people identified as African-American.      Creatinine, Ser  Date Value Ref Range Status  05/25/2021 1.05 0.76 - 1.27 mg/dL Final         Failed - K in normal range and within 180 days    Potassium  Date Value Ref Range Status  05/25/2021 4.6 3.5 - 5.2 mmol/L Final         Failed - Na in normal range and within 180 days    Sodium  Date Value Ref Range Status  05/25/2021 139 134 - 144 mmol/L Final         Failed - eGFR is 30 or above and within 180 days    GFR, Est African American  Date Value Ref Range Status  11/03/2015 >89 >=60 mL/min Final   GFR calc Af Amer  Date Value Ref Range Status  05/06/2020 92 >59 mL/min/1.73 Final    Comment:    **In accordance with recommendations from the NKF-ASN Task force,**   [facility] is in the process of updating its eGFR calculation to the   2021 CKD-EPI creatinine equation that estimates kidney function   without a race variable.    GFR, Est Non African American  Date Value Ref Range Status  11/03/2015 >89 >=60 mL/min Final   GFR, Estimated  Date Value Ref Range Status   11/30/2020 >60 >60 mL/min Final    Comment:    (NOTE) Calculated using the CKD-EPI Creatinine Equation (2021)    GFR  Date Value Ref Range Status  09/06/2014 114.93 >60.00 mL/min Final   eGFR  Date Value Ref Range Status  05/25/2021 82 >59 mL/min/1.73 Final         Failed - Valid encounter within last 6 months    Recent Outpatient Visits           8 months ago Obesity (BMI 30.0-34.9)   [facility] Johnson, Deborah B, MD   11 months ago Rectal bleed   [place] [facility] Wright, Patrick E, MD   1 year ago Essential hypertension   [place] [facility] Johnson, Deborah B, MD   1 year ago Need for shingles vaccine   [facility] Van Ausdall, Stephen L, RPH-CPP   1 year ago Ischemic cardiomyopathy    [facility] Johnson, Deborah B, MD       Future Appointments               In 3 weeks Jordan, Peter M, MD [facility] [address] [facility] [place]            Passed - Last BP in normal range    BP Readings from Last 1 Encounters:  12/01/21 104/68           

## 2022-02-27 NOTE — Telephone Encounter (Signed)
See ICM Note

## 2022-02-27 NOTE — Progress Notes (Signed)
EPIC Encounter for ICM Monitoring  Patient Name: [name] is a 61 y.o. male Date: 02/27/2022 Primary Care Physican: Loura Pardon, MD Primary Cardiologist: Martinique Electrophysiologist: Curt Bears 11/28/2021 Weight: 242 lbs        02/27/2022 Weight: 232 lbs                                                   Spoke with patient and heart failure questions reviewed.  Pt asymptomatic for fluid accumulation.    DIET: He reports eating restaurant foods as well as foods high in salt at home during decreased impedance but did not have any fluid symptoms during that time.     CorVue thoracic impedance normal since 10/30 but was suggesting possible fluid accumulation from 9/25-10/29.    Prescribed: Furosemide 20 mg take 1 tablet by mouth daily. Spironolactone 25 mg take 1 tablet daily   Labs: 05/25/2021 Creatinine 1.05, BUN 12, Potassium 4.6, Sodium 139, GFR 82 11/30/2020 Creatinine 1.07, BUN 14, Potassium 4.0, Sodium 138, GFR >60 09/10/2020 Creatinine 1.18, BUN 14, Potassium 4.3, Sodium 141, GFR >60  A complete set of results can be found in Results Review.   Recommendations:  Recommendation to limit salt intake to 2000 mg daily and fluid intake to 64 oz daily.  Advised to limit eating restaurant foods if possible.  Encouraged to call if experiencing any fluid symptoms.    Follow-up plan: ICM clinic phone appointment on 04/02/2022.   91 day device clinic remote transmission 05/23/2022.     EP/Cardiology Office Visits:  03/19/2022 with Dr Martinique. Recall 11/26/2022 with Oda Kilts, PA.   Copy of ICM check sent to Dr. Curt Bears.  3 month ICM trend: 02/26/2022.    12-14 Month ICM trend:     Rosalene Billings, RN 02/27/2022 9:24 AM

## 2022-03-06 NOTE — Progress Notes (Signed)
Remote ICD transmission.   

## 2022-03-08 ENCOUNTER — Other Ambulatory Visit: Payer: Self-pay | Admitting: Internal Medicine

## 2022-03-08 DIAGNOSIS — E785 Hyperlipidemia, unspecified: Secondary | ICD-10-CM

## 2022-03-08 DIAGNOSIS — I25810 Atherosclerosis of coronary artery bypass graft(s) without angina pectoris: Secondary | ICD-10-CM

## 2022-03-09 NOTE — Telephone Encounter (Signed)
Requested medication (s) are due for refill today:   Yes  Requested medication (s) are on the active medication list:   Yes  Future visit scheduled:   No  Cancelled his last appt. For 10/19/2021   Last ordered: 10/11/2021 #90, 0 refills  Returned for provider to review for refills since cancelled last appt.   No upcoming appts. noted   Requested Prescriptions  Pending Prescriptions Disp Refills   atorvastatin (LIPITOR) 80 MG tablet [Pharmacy Med Name: ATORVASTATIN CALCIUM 80 MG Tablet] 90 tablet 3    Sig: TAKE 1 TABLET EVERY DAY     Cardiovascular:  Antilipid - Statins Failed - 03/08/2022  2:57 AM      Failed - Valid encounter within last 12 months    Recent Outpatient Visits           9 months ago Obesity (BMI 30.0-34.9)   [place], MD   1 year ago Rectal bleed   [place], Patrick E, MD   1 year ago Essential hypertension   [place], MD   1 year ago Need for shingles vaccine   [place], RPH-CPP   1 year ago Ischemic cardiomyopathy   [place], MD       Future Appointments             In 1 week Martinique, Ander Slade, MD [place]            Failed - Lipid Panel in normal range within the last 12 months    Cholesterol, Total  Date Value Ref Range Status  05/25/2021 148 100 - 199 mg/dL Final   LDL Chol Calc (NIH)  Date Value Ref Range Status  05/25/2021 72 0 - 99 mg/dL Final   HDL  Date Value Ref Range Status  05/25/2021 56 >39 mg/dL Final   Triglycerides  Date Value Ref Range Status  05/25/2021 112 0 - 149 mg/dL Final         Passed - Patient is not pregnant

## 2022-03-15 NOTE — Progress Notes (Deleted)
Cardiology Office Note:    Date:  03/15/2022   ID:  [name], DOB 06-27-1960, MRN [id number]  PCP:  Loura Pardon, MD   [facility] Providers Cardiologist:  Adrean Heitz Martin[place], MD Electrophysiologist:  Thompson Grayer, MD     Referring MD: Loura Pardon, MD   No chief complaint on file.   History of Present Illness:    [name] is a 61 y.o. male with a hx of CAD s/p CABG 2014, ischemic cardiomyopathy s/p ICD, HTN, HLD, prediabetes, history of cardiac tamponade, pericarditis and history of CVA.  He had a stent to left circumflex and RCA in 2007.  Repeat cardiac catheterization in September 2014 revealed totally occluded RCA, 50% in-stent restenosis of the left circumflex stent, medical therapy was recommended.  He was admitted in October 2014 due to large pericardial effusion and tamponade due to ruptured LV pseudoaneurysm.  Pericardial drain was placed.  Unfortunately prior to the discharge, he developed signs of stroke with left arm weakness and left visual field deficit.  He then developed hemodynamic collapse and required intubation.  Repeat echocardiogram showed complex pericardial effusion with right ventricular collapse and tamponade.  He underwent emergent median sternotomy and repair of the ruptured inferoseptal posterior LV pseudoaneurysm with CorMatrix patch and CABG x1 with SVG to OM by Dr. Roxan Hockey.  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  Echocardiogram at the time was 25% with moderate pericardial effusion.  By February 2017, ejection fraction has improved to 30 to 35%.  Due to persistently low ejection fraction despite optimized medical therapy, he underwent ICD implantation by Dr. Rayann Heman on 05/25/2020.  He presented to the hospital in May 2020 with chest pain.  EKG was unchanged.  The first troponin was negative, however second troponin went up to 38.  Due to  low blood pressure, carvedilol and Entresto were held.  Subsequent echocardiogram obtained on 09/11/2020 showed EF 25 to 30%, grade 2 DD, trivial MR.  Repeat cardiac catheterization performed on 09/13/2020 showed chronically occluded proximal to mid RCA, 40% proximal LAD, 50% ostial to proximal left circumflex lesion, no SVG to OM was visualized and felt to be occluded, EF was normal.  Medical therapy was recommended.  He was placed on Farxiga as he had nausea and vomiting with Jardiance.  Carvedilol and Entresto were restarted prior to discharge.   Due to evidence of volume overload on CorVue, it was recommended for the patient to increase Lasix to 40 mg daily for 4 days before going back to 20 mg daily thereafter.   On follow up today he is feeling well. Active playing with his 3 grandchildren. No chest pain, dyspnea, palpitations or edema. Has lost 2 lbs. Feels well.   Past Medical History:  Diagnosis Date   Abnormal EKG    hx of ischemia showing up on ekg's   Acute myopericarditis    a. 03/2013: readm with hypoxia, tachycardia, elevated ESR/CRP, elevated troponin with Dressler syndrome and myopericarditis; treated with steroids.   Acute respiratory failure ([place])    a. 01/2013: VDRF. b. 03/2013: hypoxia requiring supp O2 during adm, resolved by discharge.   C. difficile colitis    a. 01/2013 during prolonged adm. b. Recurred 03/2013.   Cardiac tamponade    a. 01/2013 s/p drain.   Cataracts, bilateral    Coronary artery disease    a. s/p MI in 2009 in [place] with stenting of the LCX  and LAD;  b. 12/2012 NSTEMI/CAD: LM nl, LAD patent prox stent, LCX 50-70 isr (FFR 0.84), RCA dom, 145m EF 40-45%-->Med Rx. c. 01/2013: anterolateral STEMI complicated by pericardial effusion (presumed purulent pericarditis) and tamponade s/p drain, ruptured LV pseudoaneurysm s/p CorMatrix patch, CABGx1 (SVG-OM1), fever, CVA, VDRF, C diff.   CVA (cerebral infarction)    a. 01/2013 in setting of prolonged hospitalization -  residual L arm weakness.   [place]    a. 03/2013: readm with hypoxia, tachycardia, elevated ESR/CRP, elevated troponin with Dressler syndrome and myopericarditis; treated with steroids.   Glaucoma    Hyperlipidemia    Hypokalemia    Hyponatremia    a. During late 2014.   Ischemic cardiomyopathy    a. Sept/Oct 2014: EF ~40% (ICM). b. 03/2013: EF 25-30%. Off ACEI due to hypotension (MIXED NICM/ICM).   Optic neuropathy, ischemic    Pericardial effusion    a. 01/2013: pericardial effusion (presumed purulent pericarditis) and cardiac tamponade s/p drain. b. Persistent moderate pericardial effusion 03/2013.   Pre-diabetes    Pseudoaneurysm of left ventricle of heart    a. 01/2013:  Ruptured inferoposterior LV pseudoaneurysm s/p CorMatrix patch.   Sinus bradycardia    asymptomatic   Stroke ([facility] 2009   weak lt side-lt arm   Tobacco abuse    Wears dentures    top    Past Surgical History:  Procedure Laterality Date   CARDIAC CATHETERIZATION  01/06/2013   CATARACT EXTRACTION Bilateral 05/2021   COLONOSCOPY WITH PROPOFOL N/A 07/03/2016   Procedure: COLONOSCOPY WITH PROPOFOL;  Surgeon: SManus Gunning MD;  [facility];  Service: Gastroenterology;  Laterality: N/A;   CORONARY ANGIOPLASTY WITH STENT PLACEMENT  2000's X 2   "1 + 1" (01/06/2013)   CORONARY ARTERY BYPASS GRAFT N/A 01/28/2013   Procedure: CORONARY ARTERY BYPASS GRAFTING (CABG);  Surgeon: SMelrose Nakayama MD;  [place]  Service: Open Heart Surgery;  Laterality: N/A;  CABG x one, using left greater saphenous vein harvested endoscopically   ICD IMPLANT N/A 05/25/2020   Procedure: ICD IMPLANT;  Surgeon: AThompson Grayer MD;  [facility];  Service: Cardiovascular;  Laterality: N/A;   INTRAOPERATIVE TRANSESOPHAGEAL ECHOCARDIOGRAM N/A 01/28/2013   Procedure: INTRAOPERATIVE TRANSESOPHAGEAL ECHOCARDIOGRAM;  Surgeon: SMelrose Nakayama MD;  [place]  Service: Open  Heart Surgery;  Laterality: N/A;   LEFT HEART CATH AND CORS/GRAFTS ANGIOGRAPHY N/A 09/13/2020   Procedure: LEFT HEART CATH AND CORS/GRAFTS ANGIOGRAPHY;  Surgeon: JMartinique Jahbari Repinski M, MD;  [facility];  Service: Cardiovascular;  Laterality: N/A;   LEFT HEART CATHETERIZATION WITH CORONARY ANGIOGRAM N/A 01/06/2013   Procedure: LEFT HEART CATHETERIZATION WITH CORONARY ANGIOGRAM;  Surgeon: Zeus Marquis M JMartinique MD;  [facility];  Service: Cardiovascular;  Laterality: N/A;   LEFT HEART CATHETERIZATION WITH CORONARY ANGIOGRAM N/A 01/21/2013   Procedure: LEFT HEART CATHETERIZATION WITH CORONARY ANGIOGRAM;  Surgeon: CBurnell Blanks MD;  [facility];  Service: Cardiovascular;  Laterality: N/A;   LUMBAR LAMINECTOMY/ DECOMPRESSION WITH MET-RX Right 06/30/2018   Procedure: Right minimally invasive Lumbar Three-Four Far lateral discectomy;  Surgeon: OJudith Part MD;  [place]  Service: Neurosurgery;  Laterality: Right;  Right minimally invasive Lumbar Three-Four Far lateral discectomy   MASS EXCISION Right 07/21/2014   Procedure: EXCISION RIGHT NECK MASS;  Surgeon: TErroll Luna MD;  [place]  Service: General;  Laterality: Right;   PERICARDIAL TAP N/A 01/24/2013   Procedure: PERICARDIAL TAP;  Surgeon: Blane Ohara, MD;  Location: [facility] CATH LAB;  Service: Cardiovascular;  Laterality: N/A;   RIGHT HEART CATHETERIZATION Right 01/24/2013   Procedure: RIGHT HEART CATH;  Surgeon: Blane Ohara, MD;  [facility];  Service: Cardiovascular;  Laterality: Right;   VENTRICULAR ANEURYSM RESECTION N/A 01/28/2013   Procedure: LEFT VENTRICULAR ANEURYSM REPAIR;  Surgeon: Melrose Nakayama, MD;  Location: [place];  Service: Open Heart Surgery;  Laterality: N/A;    Current Medications: No outpatient medications have been marked as taking for the 03/19/22 encounter (Appointment) with Martinique, Sorina Derrig M, MD.     Allergies:   Patient  has no known allergies.   Social History   Socioeconomic History   Marital status: Single    Spouse name: Not on file   Number of children: 2   Years of education: 12th   Highest education level: Not on file  Occupational History   Occupation: N/A  Tobacco Use   Smoking status: Every Day    Packs/day: 1.00    Years: 35.00    Total pack years: 35.00    Types: Cigarettes   Smokeless tobacco: Never  Vaping Use   Vaping Use: Never used  Substance and Sexual Activity   Alcohol use: No    Alcohol/week: 0.0 standard drinks of alcohol   Drug use: No   Sexual activity: Yes  Other Topics Concern   Not on file  Social History Narrative   Lives in [place] with wife.  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  He is now retired on disability.  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      Caffeine Use: very rarely   Social Determinants of Radio broadcast assistant Strain: Not on file  Food Insecurity: Not on file  Transportation Needs: Not on file  Physical Activity: Not on file  Stress: Not on file  Social Connections: Not on file     Family History: The patient's family history includes CAD in an other family member; Diabetes in his mother; HIV in his brother; Heart attack in his mother; Hypertension in his father and mother. There is no history of Stroke.  ROS:   Please see the history of present illness.     All other systems reviewed and are negative.  EKGs/Labs/Other Studies Reviewed:    The following studies were reviewed today:  Echo 09/11/2020  1. Left ventricular ejection fraction, by estimation, is 25 to 30%. The  left ventricle has severely decreased function. The left ventricle  demonstrates regional wall motion abnormalities (see scoring  diagram/findings for description). Left ventricular  diastolic parameters are consistent with Grade II diastolic dysfunction  (pseudonormalization). Elevated left ventricular end-diastolic pressure.  There is  of the left ventricular, . There is of the left ventricular,.   2. Right ventricular systolic function was not well visualized. The right  ventricular size is normal. Tricuspid regurgitation signal is inadequate  for assessing PA pressure.   3. The mitral valve is grossly normal. Trivial mitral valve  regurgitation.   4. The aortic valve is tricuspid. Aortic valve regurgitation is not  visualized. No aortic stenosis is present.   5. The inferior vena cava is normal in size with <50% respiratory  variability, suggesting right atrial pressure of 8 mmHg.   Comparison(s): No significant change from prior study.    Cath 09/13/2020 Prox LAD lesion is 40% stenosed. Prox LAD to Mid LAD lesion is 10% stenosed. Prox Cx  to Mid Cx lesion is 20% stenosed. Ost Cx to Prox Cx lesion is 50% stenosed. Prox RCA to Mid RCA lesion is 100% stenosed. SVG graft was not visualized due to known occlusion. Origin to Prox Graft lesion is 100% stenosed. LV end diastolic pressure is normal.   1. Single vessel occlusive CAD with CTO of the mid RCA.  2. Patent stents in the mid LAD and LCx.  3. No SVG to OM visualized but is felt to be occluded. 4. Low LVEDP 3 mm Hg   Plan: recommend continued medical management.    EKG:  EKG is ordered today. NSR with old inferior infarct. T wave inversion laterally. LAD. No change from prior. I have personally reviewed and interpreted this study.   Recent Labs: 05/25/2021: ALT 15; BUN 12; Creatinine, Ser 1.05; Potassium 4.6; Sodium 139  Recent Lipid Panel    Component Value Date/Time   CHOL 148 05/25/2021 0835   TRIG 112 05/25/2021 0835   HDL 56 05/25/2021 0835   CHOLHDL 2.6 05/25/2021 0835   CHOLHDL 2.5 11/03/2015 1609   VLDL 19 11/03/2015 1609   LDLCALC 72 05/25/2021 0835     Risk Assessment/Calculations:           Physical Exam:    VS:  There were no vitals taken for this visit.    Wt Readings from Last 3 Encounters:  12/01/21 242 lb (109.8 kg)   10/09/21 243 lb (110.2 kg)  05/02/21 245 lb 9.6 oz (111.4 kg)     GEN:  Well nourished, well developed in no acute distress HEENT: Normal NECK: No JVD; No carotid bruits LYMPHATICS: No lymphadenopathy CARDIAC: RRR, no murmurs, rubs, gallops RESPIRATORY:  Clear to auscultation without rales, wheezing or rhonchi  ABDOMEN: Soft, non-tender, non-distended MUSCULOSKELETAL:  No edema; No deformity  SKIN: Warm and dry NEUROLOGIC:  Alert and oriented x 3 PSYCHIATRIC:  Normal affect   ASSESSMENT:    No diagnosis found.   PLAN:    In order of problems listed above:  Chronic systolic heart failure: Baseline EF 25 to 30% based on last echocardiogram.  He appears to be euvolemic on exam.  Recent CorVue study also appears to show his volume is normal.  On Farxiga, Entresto, carvedilol and spironolactone. Is euvolemic.   Hyperlipidemia: Continue Lipitor. Last LDL 72.   CAD: Denies any chest pain.  Continue aspirin  Hypertension: Blood pressure well controlled on current therapy  Prediabetes: Followed by primary care provider  History of CVA: No recent recurrence  History of ICD: Followed by EP            Medication Adjustments/Labs and Tests Ordered: Current medicines are reviewed at length with the patient today.  Concerns regarding medicines are outlined above.  No orders of the defined types were placed in this encounter.  No orders of the defined types were placed in this encounter.   There are no Patient Instructions on file for this visit.   Signed, Doryce Mcgregory Martinique, MD  03/15/2022 2:08 PM    [facility]

## 2022-03-19 ENCOUNTER — Ambulatory Visit: Payer: Medicare HMO | Admitting: Cardiology

## 2022-04-02 ENCOUNTER — Ambulatory Visit: Payer: Medicare HMO | Admitting: Physician Assistant

## 2022-04-02 ENCOUNTER — Ambulatory Visit (INDEPENDENT_AMBULATORY_CARE_PROVIDER_SITE_OTHER): Payer: Medicare HMO

## 2022-04-02 DIAGNOSIS — I5022 Chronic systolic (congestive) heart failure: Secondary | ICD-10-CM

## 2022-04-02 DIAGNOSIS — Z95810 Presence of automatic (implantable) cardiac defibrillator: Secondary | ICD-10-CM | POA: Diagnosis not present

## 2022-04-05 ENCOUNTER — Telehealth: Payer: Self-pay

## 2022-04-05 NOTE — Telephone Encounter (Signed)
Remote ICM transmission received.  Attempted call to patient regarding ICM remote transmission and left detailed message per DPR.  Advised to return call for any fluid symptoms or questions. Next ICM remote transmission scheduled 05/07/2022.

## 2022-04-05 NOTE — Progress Notes (Signed)
EPIC Encounter for ICM Monitoring  Patient Name: [name] is a 61 y.o. male Date: 04/05/2022 Primary Care Physican: Loura Pardon, MD Primary Cardiologist: Martinique Electrophysiologist: Curt Bears 11/28/2021 Weight: 242 lbs        02/27/2022 Weight: 232 lbs                                                   Attempted call to patient and unable to reach.  Left detailed message per DPR regarding transmission. Transmission reviewed.       CorVue thoracic impedance normal but was suggesting possible fluid accumulation from 11/24-12/1.    Prescribed: Furosemide 20 mg take 1 tablet by mouth daily. Spironolactone 25 mg take 1 tablet daily   Labs: 05/25/2021 Creatinine 1.05, BUN 12, Potassium 4.6, Sodium 139, GFR 82 11/30/2020 Creatinine 1.07, BUN 14, Potassium 4.0, Sodium 138, GFR >60 09/10/2020 Creatinine 1.18, BUN 14, Potassium 4.3, Sodium 141, GFR >60  A complete set of results can be found in Results Review.   Recommendations: Left voice mail with ICM number and encouraged to call if experiencing any fluid symptoms.   Follow-up plan: ICM clinic phone appointment on 05/07/2022.   91 day device clinic remote transmission 05/23/2022.     EP/Cardiology Office Visits:  03/19/2022 with Dr Martinique needs to be rescheduled. Recall 11/26/2022 with Oda Kilts, PA.   Copy of ICM check sent to Dr. Curt Bears.  3 month ICM trend: 04/02/2022.    12-14 Month ICM trend:     Rosalene Billings, RN 04/05/2022 10:00 AM

## 2022-04-23 ENCOUNTER — Ambulatory Visit (INDEPENDENT_AMBULATORY_CARE_PROVIDER_SITE_OTHER): Payer: Medicare HMO | Admitting: Podiatry

## 2022-04-23 VITALS — BP 99/64 | HR 64

## 2022-04-23 DIAGNOSIS — L600 Ingrowing nail: Secondary | ICD-10-CM | POA: Diagnosis not present

## 2022-04-23 NOTE — Progress Notes (Signed)
Chief Complaint  Patient presents with   Ingrown Toenail    Right hallux ingrown toenail, started 2 weeks ago, toe is sore, patient denies any drainage    Subjective: Patient presents today for evaluation of pain to the medial border right great toe. Patient is concerned for possible ingrown nail.  It is very sensitive to touch.  Patient presents today for further treatment and evaluation.  Past Medical History:  Diagnosis Date   Abnormal EKG    hx of ischemia showing up on ekg's   Acute myopericarditis    a. 03/2013: readm with hypoxia, tachycardia, elevated ESR/CRP, elevated troponin with Dressler syndrome and myopericarditis; treated with steroids.   Acute respiratory failure ([place])    a. 01/2013: VDRF. b. 03/2013: hypoxia requiring supp O2 during adm, resolved by discharge.   C. difficile colitis    a. 01/2013 during prolonged adm. b. Recurred 03/2013.   Cardiac tamponade    a. 01/2013 s/p drain.   Cataracts, bilateral    Coronary artery disease    a. s/p MI in 2009 in [place] with stenting of the LCX and LAD;  b. 12/2012 NSTEMI/CAD: LM nl, LAD patent prox stent, LCX 50-70 isr (FFR 0.84), RCA dom, 164m EF 40-45%-->Med Rx. c. 01/2013: anterolateral STEMI complicated by pericardial effusion (presumed purulent pericarditis) and tamponade s/p drain, ruptured LV pseudoaneurysm s/p CorMatrix patch, CABGx1 (SVG-OM1), fever, CVA, VDRF, C diff.   CVA (cerebral infarction)    a. 01/2013 in setting of prolonged hospitalization - residual L arm weakness.   Dressler syndrome ([place]    a. 03/2013: readm with hypoxia, tachycardia, elevated ESR/CRP, elevated troponin with Dressler syndrome and myopericarditis; treated with steroids.   Glaucoma    Hyperlipidemia    Hypokalemia    Hyponatremia    a. During late 2014.   Ischemic cardiomyopathy    a. Sept/Oct 2014: EF ~40% (ICM). b. 03/2013: EF 25-30%. Off ACEI due to hypotension (MIXED NICM/ICM).   Optic neuropathy, ischemic    Pericardial effusion     a. 01/2013: pericardial effusion (presumed purulent pericarditis) and cardiac tamponade s/p drain. b. Persistent moderate pericardial effusion 03/2013.   Pre-diabetes    Pseudoaneurysm of left ventricle of heart    a. 01/2013:  Ruptured inferoposterior LV pseudoaneurysm s/p CorMatrix patch.   Sinus bradycardia    asymptomatic   Stroke ([facility] 2009   weak lt side-lt arm   Tobacco abuse    Wears dentures    top    Objective:  General: Well developed, nourished, in no acute distress, alert and oriented x3   Dermatology: Skin is warm, dry and supple bilateral.  Medial border right great toe is tender with evidence of an ingrowing nail. Pain on palpation noted to the border of the nail fold. The remaining nails appear unremarkable at this time. There are no open sores, lesions.  Vascular: DP and PT pulses palpable.  No clinical evidence of vascular compromise  Neruologic: Grossly intact via light touch bilateral.  Musculoskeletal: No pedal deformity noted  Assesement: #1 Paronychia with ingrowing nail medial border right great toe  Plan of Care:  1. Patient evaluated.  2. Discussed treatment alternatives and plan of care. Explained nail avulsion procedure and post procedure course to patient. 3. Patient opted for permanent partial nail avulsion of the ingrown portion of the nail.  4. Prior to procedure, local anesthesia infiltration utilized using 3 ml of a 50:50 mixture of 2% plain lidocaine and 0.5% plain marcaine in a normal hallux block  fashion and a betadine prep performed.  5. Partial permanent nail avulsion with chemical matrixectomy performed using [id number] applications of phenol followed by alcohol flush.  6. Light dressing applied.  Post care instructions provided 7.  Return to clinic 2 weeks.  Edrick Kins, DPM [facility]  Dr. Edrick Kins, DPM    [address], [place] [address]                Office  [phone]  Fax [phone]

## 2022-04-30 ENCOUNTER — Ambulatory Visit: Payer: Medicare HMO | Attending: Nurse Practitioner | Admitting: Nurse Practitioner

## 2022-04-30 ENCOUNTER — Encounter: Payer: Self-pay | Admitting: Nurse Practitioner

## 2022-04-30 VITALS — BP 92/60 | HR 62 | Ht 73.0 in | Wt 238.0 lb

## 2022-04-30 DIAGNOSIS — I5022 Chronic systolic (congestive) heart failure: Secondary | ICD-10-CM

## 2022-04-30 DIAGNOSIS — I10 Essential (primary) hypertension: Secondary | ICD-10-CM | POA: Diagnosis not present

## 2022-04-30 DIAGNOSIS — I2510 Atherosclerotic heart disease of native coronary artery without angina pectoris: Secondary | ICD-10-CM

## 2022-04-30 DIAGNOSIS — I255 Ischemic cardiomyopathy: Secondary | ICD-10-CM | POA: Diagnosis not present

## 2022-04-30 DIAGNOSIS — R7303 Prediabetes: Secondary | ICD-10-CM

## 2022-04-30 DIAGNOSIS — E785 Hyperlipidemia, unspecified: Secondary | ICD-10-CM

## 2022-04-30 DIAGNOSIS — Z8673 Personal history of transient ischemic attack (TIA), and cerebral infarction without residual deficits: Secondary | ICD-10-CM

## 2022-04-30 NOTE — Progress Notes (Signed)
Office Visit    Patient Name: [name] Date of Encounter: 04/30/2022  Primary Care Provider:  Loura Pardon, MD Primary Cardiologist:  Peter Martinique, MD  Chief Complaint    62 year old male with a history of  CAD s/p CABG in 3570, chronic systolic heart failure, ICM s/p ICD, pericarditis, hypertension, hyperlipidemia, CVA, and prediabetes who presents for follow-up related to CAD and heart failure.  Past Medical History    Past Medical History:  Diagnosis Date   Abnormal EKG    hx of ischemia showing up on ekg's   Acute myopericarditis    a. 03/2013: readm with hypoxia, tachycardia, elevated ESR/CRP, elevated troponin with Dressler syndrome and myopericarditis; treated with steroids.   Acute respiratory failure ([place])    a. 01/2013: VDRF. b. 03/2013: hypoxia requiring supp O2 during adm, resolved by discharge.   C. difficile colitis    a. 01/2013 during prolonged adm. b. Recurred 03/2013.   Cardiac tamponade    a. 01/2013 s/p drain.   Cataracts, bilateral    Coronary artery disease    a. s/p MI in 2009 in [place] with stenting of the LCX and LAD;  b. 12/2012 NSTEMI/CAD: LM nl, LAD patent prox stent, LCX 50-70 isr (FFR 0.84), RCA dom, 152m EF 40-45%-->Med Rx. c. 01/2013: anterolateral STEMI complicated by pericardial effusion (presumed purulent pericarditis) and tamponade s/p drain, ruptured LV pseudoaneurysm s/p CorMatrix patch, CABGx1 (SVG-OM1), fever, CVA, VDRF, C diff.   CVA (cerebral infarction)    a. 01/2013 in setting of prolonged hospitalization - residual L arm weakness.   [place]ressler syndrome [place]    a. 03/2013: readm with hypoxia, tachycardia, elevated ESR/CRP, elevated troponin with Dressler syndrome and myopericarditis; treated with steroids.   Glaucoma    Hyperlipidemia    Hypokalemia    Hyponatremia    a. During late 2014.   Ischemic cardiomyopathy    a. Sept/Oct 2014: EF ~40% (ICM). b. 03/2013: EF 25-30%. Off ACEI due to hypotension (MIXED NICM/ICM).    Optic neuropathy, ischemic    Pericardial effusion    a. 01/2013: pericardial effusion (presumed purulent pericarditis) and cardiac tamponade s/p drain. b. Persistent moderate pericardial effusion 03/2013.   Pre-diabetes    Pseudoaneurysm of left ventricle of heart    a. 01/2013:  Ruptured inferoposterior LV pseudoaneurysm s/p CorMatrix patch.   Sinus bradycardia    asymptomatic   Stroke ([facility] 2009   weak lt side-lt arm   Tobacco abuse    Wears dentures    top   Past Surgical History:  Procedure Laterality Date   CARDIAC CATHETERIZATION  01/06/2013   CATARACT EXTRACTION Bilateral 05/2021   COLONOSCOPY WITH PROPOFOL N/A 07/03/2016   Procedure: COLONOSCOPY WITH PROPOFOL;  Surgeon: SManus Gunning MD;  [facility];  Service: Gastroenterology;  Laterality: N/A;   CORONARY ANGIOPLASTY WITH STENT PLACEMENT  2000's X 2   "1 + 1" (01/06/2013)   CORONARY ARTERY BYPASS GRAFT N/A 01/28/2013   Procedure: CORONARY ARTERY BYPASS GRAFTING (CABG);  Surgeon: SMelrose Nakayama MD;  [facility]  Service: Open Heart Surgery;  Laterality: N/A;  CABG x one, using left greater saphenous vein harvested endoscopically   ICD IMPLANT N/A 05/25/2020   Procedure: ICD IMPLANT;  Surgeon: AThompson Grayer MD;  [facility];  Service: Cardiovascular;  Laterality: N/A;   INTRAOPERATIVE TRANSESOPHAGEAL ECHOCARDIOGRAM N/A 01/28/2013   Procedure: INTRAOPERATIVE TRANSESOPHAGEAL ECHOCARDIOGRAM;  Surgeon: SMelrose Nakayama MD;  [place] [facility]  Service: Open Heart Surgery;  Laterality: N/A;  LEFT HEART CATH AND CORS/GRAFTS ANGIOGRAPHY N/A 09/13/2020   Procedure: LEFT HEART CATH AND CORS/GRAFTS ANGIOGRAPHY;  Surgeon: Martinique, Peter M, MD;  Location: [place] [facility] LAB;  Service: Cardiovascular;  Laterality: N/A;   LEFT HEART CATHETERIZATION WITH CORONARY ANGIOGRAM N/A 01/06/2013   Procedure: LEFT HEART CATHETERIZATION WITH CORONARY ANGIOGRAM;  Surgeon: Peter M Martinique, MD;   Location: [facility] CATH LAB;  Service: Cardiovascular;  Laterality: N/A;   LEFT HEART CATHETERIZATION WITH CORONARY ANGIOGRAM N/A 01/21/2013   Procedure: LEFT HEART CATHETERIZATION WITH CORONARY ANGIOGRAM;  Surgeon: Burnell Blanks, MD;  Location: [facility] CATH LAB;  Service: Cardiovascular;  Laterality: N/A;   LUMBAR LAMINECTOMY/ DECOMPRESSION WITH MET-RX Right 06/30/2018   Procedure: Right minimally invasive Lumbar Three-Four Far lateral discectomy;  Surgeon: Judith Part, MD;  Location: [place];  Service: Neurosurgery;  Laterality: Right;  Right minimally invasive Lumbar Three-Four Far lateral discectomy   MASS EXCISION Right 07/21/2014   Procedure: EXCISION RIGHT NECK MASS;  Surgeon: Erroll Luna, MD;  Location: [place];  Service: General;  Laterality: Right;   PERICARDIAL TAP N/A 01/24/2013   Procedure: PERICARDIAL TAP;  Surgeon: Blane Ohara, MD;  Location: [facility] CATH LAB;  Service: Cardiovascular;  Laterality: N/A;   RIGHT HEART CATHETERIZATION Right 01/24/2013   Procedure: RIGHT HEART CATH;  Surgeon: Blane Ohara, MD;  Location: [facility] CATH LAB;  Service: Cardiovascular;  Laterality: Right;   VENTRICULAR ANEURYSM RESECTION N/A 01/28/2013   Procedure: LEFT VENTRICULAR ANEURYSM REPAIR;  Surgeon: Melrose Nakayama, MD;  Location: [place];  Service: Open Heart Surgery;  Laterality: N/A;    Allergies  No Known Allergies   Labs/Other Studies Reviewed    The following studies were reviewed today: Echo 09/11/2020:  1. Left ventricular ejection fraction, by estimation, is 25 to 30%. The  left ventricle has severely decreased function. The left ventricle  demonstrates regional wall motion abnormalities (see scoring  diagram/findings for description). Left ventricular  diastolic parameters are consistent with Grade II diastolic dysfunction  (pseudonormalization). Elevated left ventricular end-diastolic pressure.  There is of the left ventricular, . There is of the left  ventricular,.   2. Right ventricular systolic function was not well visualized. The right  ventricular size is normal. Tricuspid regurgitation signal is inadequate  for assessing PA pressure.   3. The mitral valve is grossly normal. Trivial mitral valve  regurgitation.   4. The aortic valve is tricuspid. Aortic valve regurgitation is not  visualized. No aortic stenosis is present.   5. The inferior vena cava is normal in size with <50% respiratory  variability, suggesting right atrial pressure of 8 mmHg.   Comparison(s): No significant change from prior study.    Cath 09/13/2020: Prox LAD lesion is 40% stenosed. Prox LAD to Mid LAD lesion is 10% stenosed. Prox Cx to Mid Cx lesion is 20% stenosed. Ost Cx to Prox Cx lesion is 50% stenosed. Prox RCA to Mid RCA lesion is 100% stenosed. SVG graft was not visualized due to known occlusion. Origin to Prox Graft lesion is 100% stenosed. LV end diastolic pressure is normal.   1. Single vessel occlusive CAD with CTO of the mid RCA.  2. Patent stents in the mid LAD and LCx.  3. No SVG to OM visualized but is felt to be occluded. 4. Low LVEDP 3 mm Hg   Plan: recommend continued medical management.    Recent Labs: 05/25/2021: ALT 15; BUN 12; Creatinine, Ser 1.05; Potassium 4.6; Sodium 139  Recent Lipid Panel  Component Value Date/Time   CHOL 148 05/25/2021 0835   TRIG 112 05/25/2021 0835   HDL 56 05/25/2021 0835   CHOLHDL 2.6 05/25/2021 0835   CHOLHDL 2.5 11/03/2015 1609   VLDL 19 11/03/2015 1609   LDLCALC 72 05/25/2021 0835    History of Present Illness    62 year old male with the above past medical history including CAD s/p CABG in 2130, chronic systolic heart failure, ICM s/p ICD, pericarditis, hypertension, hyperlipidemia, CVA, and prediabetes.  He has a history of significant CAD with prior stenting to left circumflex and RCA in 2007.  Repeat cardiac catheterization in September 2014 revealed totally occluded RCA, 50% in-stent  restenosis of the left circumflex stent, medical therapy was recommended.  He was later hospitalized in October 2014 in the setting of large pericardial effusion with tamponade due to ruptured LV pseudoaneurysm.  Pericardial drain was placed.  Prior to discharge she developed strokelike symptoms.  He developed hemodynamic instability and required intubation.  He underwent emergent median sternotomy and repair of ruptured inferoseptal posterior LV pseudoaneurysm with CorMatrix patch and CABG x 1 with SVG-OM.  He was readmitted in late 2014 with persistently elevated troponin was felt to have Dressler syndrome with myopericarditis.  EF at the time was 25%.  EF improved to 30 to 35% in 2017. However, he ultimately underwent ICD implantation in 05/2020 due to persistently low EF despite optimized medical therapy.  Cardiac catheterization in 08/2020 showed chronically occluded proximal to mid RCA, 40% proximal LAD, 50% ostial to proximal left circumflex lesion, no SVG-OM was visualized, this was felt to be occluded, medical therapy was recommended.  Most recent echo in 08/2020 showed EF 25 to 30%, G2DD.  He was last seen in the office on 10/09/2021 and was stable from a cardiac standpoint.  He saw Dr. Rayann Heman on 12/01/2021 and was doing well.  He presents today for follow-up.  Since his last visit he has been stable from a cardiac standpoint.  He denies any symptoms concerning for angina, denies dyspnea, edema, PND, orthopnea, weight gain.  Overall, he reports feeling well.  Home Medications    Current Outpatient Medications  Medication Sig Dispense Refill   ALPHAGAN P 0.1 % SOLN INSTILL 1 DROP INTO BOTH EYES TWICE A DAY 10 mL 0   aspirin 81 MG EC tablet Take 1 tablet (81 mg total) by mouth daily. Restart on 07/05/2018 90 tablet 3   atorvastatin (LIPITOR) 80 MG tablet TAKE 1 TABLET EVERY DAY 90 tablet 0   carvedilol (COREG) 25 MG tablet TAKE 1 TABLET BY MOUTH TWICE A DAY WITH A MEAL 180 tablet 3   cetirizine  (ZYRTEC) 10 MG tablet Take 1 tablet (10 mg total) by mouth daily. 30 tablet 1   Cholecalciferol (VITAMIN D) 125 MCG (5000 UT) CAPS Take 5,000 Units by mouth daily.     dapagliflozin propanediol (FARXIGA) 10 MG TABS tablet TAKE 1 TABLET EVERY DAY 90 tablet 3   ENTRESTO 97-103 MG TAKE 1 TABLET TWICE DAILY 180 tablet 3   fluticasone (FLONASE) 50 MCG/ACT nasal spray Place 1 spray into both nostrils daily. 16 g 2   furosemide (LASIX) 20 MG tablet Take 1 tablet (20 mg total) by mouth daily. 90 tablet 3   furosemide (LASIX) 20 MG tablet TAKE 1 TABLET (20 MG TOTAL) BY MOUTH DAILY AS NEEDED. FOR UPPER AND LOWER EXTREMITY FLUID RETENTION. 90 tablet 3   furosemide (LASIX) 20 MG tablet TAKE 1 TABLET EVERY DAY 90 tablet 2   gabapentin (  NEURONTIN) 300 MG capsule Take 1 capsule (300 mg total) by mouth at bedtime. 30 capsule 0   latanoprost (XALATAN) 0.005 % ophthalmic solution Place 1 drop into both eyes at bedtime.  12   methocarbamol (ROBAXIN) 500 MG tablet TAKE 1 TABLET (500 MG TOTAL) BY MOUTH 2 (TWO) TIMES DAILY AS NEEDED FOR MUSCLE SPASMS. 30 tablet 2   Multiple Vitamin (MULTIVITAMIN WITH MINERALS) TABS tablet Take 1 tablet by mouth daily. Centrum Silver     nicotine (NICODERM CQ - DOSED IN MG/24 HR) 7 mg/24hr patch Place 1 patch (7 mg total) onto the skin daily. 28 patch 1   nitroGLYCERIN (NITROSTAT) 0.4 MG SL tablet Place 1 tablet (0.4 mg total) under the tongue every 5 (five) minutes x 3 doses as needed for chest pain. 25 tablet 1   spironolactone (ALDACTONE) 25 MG tablet TAKE 1 TABLET EVERY DAY 90 tablet 0   traMADol (ULTRAM) 50 MG tablet Take 1 tablet (50 mg total) by mouth every 8 (eight) hours as needed. (Patient taking differently: Take 50 mg by mouth every 8 (eight) hours as needed for moderate pain.) 30 tablet 0   No current facility-administered medications for this visit.     Review of Systems    He denies chest pain, palpitations, dyspnea, pnd, orthopnea, n, v, dizziness, syncope, edema,  weight gain, or early satiety. All other systems reviewed and are otherwise negative except as noted above.   Physical Exam    VS:  BP 92/60 (BP Location: Right Arm, Patient Position: Sitting, Cuff Size: Large)   Pulse 62   Ht '6\' 1"'$  (1.854 m)   Wt 238 lb (108 kg)   BMI 31.40 kg/m   GEN: Well nourished, well developed, in no acute distress. HEENT: normal. Neck: Supple, no JVD, carotid bruits, or masses. Cardiac: RRR, no murmurs, rubs, or gallops. No clubbing, cyanosis, edema.  Radials/DP/PT 2+ and equal bilaterally.  Respiratory:  Respirations regular and unlabored, clear to auscultation bilaterally. GI: Soft, nontender, nondistended, BS + x 4. MS: no deformity or atrophy. Skin: warm and dry, no rash. Neuro:  Strength and sensation are intact. Psych: Normal affect.  Accessory Clinical Findings    ECG personally reviewed by me today - No EKG in office today.     Lab Results  Component Value Date   WBC 5.4 03/06/2021   HGB 15.8 03/06/2021   HCT 45.4 03/06/2021   MCV 85 03/06/2021   PLT 178 03/06/2021   Lab Results  Component Value Date   CREATININE 1.05 05/25/2021   BUN 12 05/25/2021   NA 139 05/25/2021   K 4.6 05/25/2021   CL 101 05/25/2021   CO2 23 05/25/2021   Lab Results  Component Value Date   ALT 15 05/25/2021   AST 17 05/25/2021   ALKPHOS 147 (H) 05/25/2021   BILITOT 0.4 05/25/2021   Lab Results  Component Value Date   CHOL 148 05/25/2021   HDL 56 05/25/2021   LDLCALC 72 05/25/2021   TRIG 112 05/25/2021   CHOLHDL 2.6 05/25/2021    Lab Results  Component Value Date   HGBA1C 6.2 (H) 02/29/2020    Assessment & Plan    1. CAD: S/p prior stenting-left circumflex and RCA in 2007, CABG x 1 (SVG-OM) in 2014.  Most recent cath in 08/2020 showed chronically occluded proximal to mid RCA, 40% proximal LAD, 50% ostial to proximal left circumflex lesion, no SVG-OM was visualized, this was felt to be occluded, medical therapy was recommended. Stable with  no anginal  symptoms. No indication for ischemic evaluation.  Continue aspirin, carvedilol, Entresto, spironolactone, Farxiga, Lasix, and Lipitor.  2. Chronic systolic heart failure/ICM:  S/p ICD. Most recent echo in 08/2020 showed EF 25 to 30%, G2DD. He is enrolled in [place] monitoring. Euvolemic and well compensated on exam.  Continue current medications as above.  Follow-up with EP per routine.  3. Hypertension: BP low in office today, asymptomatic.  Patient states this is his baseline. Continue current antihypertensive regimen.   4. Hyperlipidemia: LDL was 72 in 05/2021.  Will repeat fasting lipids, LFTs.  Continue aspirin, Lipitor.  5. History of CVA: No documented recurrence.  Continue aspirin, Lipitor.  6. Disposition: Follow-up in 6 months with Dr. Martinique.     Lenna Sciara, NP 04/30/2022, 12:13 PM

## 2022-04-30 NOTE — Patient Instructions (Signed)
Medication Instructions:  Your physician recommends that you continue on your current medications as directed. Please refer to the Current Medication list given to you today.   *If you need a refill on your cardiac medications before your next appointment, please call your pharmacy*   Lab Work: Your physician recommends that you return for lab work in 1-2 weeks Fasting Lipid panel LFTs  If you have labs (blood work) drawn today and your tests are completely normal, you will receive your results only by: [place] (if you have MyChart) OR A paper copy in the mail If you have any lab test that is abnormal or we need to change your treatment, we will call you to review the results.   Testing/Procedures: NONE ordered at this time of appointment     Follow-Up: At [facility], you and your health needs are our priority.  As part of our continuing mission to provide you with exceptional heart care, we have created designated Provider Care Teams.  These Care Teams include your primary Cardiologist (physician) and Advanced Practice Providers (APPs -  Physician Assistants and Nurse Practitioners) who all work together to provide you with the care you need, when you need it.  We recommend signing up for the patient portal called "MyChart".  Sign up information is provided on this After Visit Summary.  MyChart is used to connect with patients for Virtual Visits (Telemedicine).  Patients are able to view lab/test results, encounter notes, upcoming appointments, etc.  Non-urgent messages can be sent to your provider as well.   To learn more about what you can do with MyChart, go to [email or web address].    Your next appointment:   1 Month (EP, APP @ church [place]) 6 Months (Dr. Martinique)  Provider:   Peter Martinique, MD     Other Instructions

## 2022-05-02 ENCOUNTER — Other Ambulatory Visit: Payer: Self-pay | Admitting: Cardiology

## 2022-05-04 ENCOUNTER — Observation Stay (HOSPITAL_COMMUNITY): Payer: Medicare HMO

## 2022-05-04 ENCOUNTER — Observation Stay (HOSPITAL_BASED_OUTPATIENT_CLINIC_OR_DEPARTMENT_OTHER): Payer: Medicare HMO

## 2022-05-04 ENCOUNTER — Emergency Department (HOSPITAL_COMMUNITY): Payer: Medicare HMO

## 2022-05-04 ENCOUNTER — Other Ambulatory Visit: Payer: Self-pay

## 2022-05-04 ENCOUNTER — Observation Stay (HOSPITAL_COMMUNITY)
Admission: EM | Admit: 2022-05-04 | Discharge: 2022-05-04 | Disposition: A | Discharge status: Home / Self Care | Payer: Medicare HMO | Attending: Internal Medicine | Admitting: Internal Medicine

## 2022-05-04 ENCOUNTER — Other Ambulatory Visit (HOSPITAL_COMMUNITY): Payer: Self-pay

## 2022-05-04 ENCOUNTER — Encounter (HOSPITAL_COMMUNITY): Payer: Self-pay | Admitting: Emergency Medicine

## 2022-05-04 DIAGNOSIS — F17210 Nicotine dependence, cigarettes, uncomplicated: Secondary | ICD-10-CM | POA: Insufficient documentation

## 2022-05-04 DIAGNOSIS — I5022 Chronic systolic (congestive) heart failure: Secondary | ICD-10-CM | POA: Insufficient documentation

## 2022-05-04 DIAGNOSIS — R55 Syncope and collapse: Secondary | ICD-10-CM | POA: Diagnosis present

## 2022-05-04 DIAGNOSIS — Z7984 Long term (current) use of oral hypoglycemic drugs: Secondary | ICD-10-CM | POA: Insufficient documentation

## 2022-05-04 DIAGNOSIS — G459 Transient cerebral ischemic attack, unspecified: Secondary | ICD-10-CM

## 2022-05-04 DIAGNOSIS — Z8673 Personal history of transient ischemic attack (TIA), and cerebral infarction without residual deficits: Secondary | ICD-10-CM | POA: Diagnosis not present

## 2022-05-04 DIAGNOSIS — I639 Cerebral infarction, unspecified: Principal | ICD-10-CM | POA: Insufficient documentation

## 2022-05-04 DIAGNOSIS — I2510 Atherosclerotic heart disease of native coronary artery without angina pectoris: Secondary | ICD-10-CM | POA: Diagnosis not present

## 2022-05-04 DIAGNOSIS — I110 Hypertensive heart disease with heart failure: Secondary | ICD-10-CM | POA: Diagnosis not present

## 2022-05-04 DIAGNOSIS — R4702 Dysphasia: Secondary | ICD-10-CM | POA: Diagnosis not present

## 2022-05-04 DIAGNOSIS — Z951 Presence of aortocoronary bypass graft: Secondary | ICD-10-CM | POA: Diagnosis not present

## 2022-05-04 DIAGNOSIS — Z95810 Presence of automatic (implantable) cardiac defibrillator: Secondary | ICD-10-CM | POA: Insufficient documentation

## 2022-05-04 DIAGNOSIS — Z955 Presence of coronary angioplasty implant and graft: Secondary | ICD-10-CM | POA: Diagnosis not present

## 2022-05-04 DIAGNOSIS — I6389 Other cerebral infarction: Secondary | ICD-10-CM | POA: Diagnosis not present

## 2022-05-04 DIAGNOSIS — Z720 Tobacco use: Secondary | ICD-10-CM | POA: Diagnosis present

## 2022-05-04 DIAGNOSIS — R2681 Unsteadiness on feet: Secondary | ICD-10-CM | POA: Insufficient documentation

## 2022-05-04 DIAGNOSIS — Z7982 Long term (current) use of aspirin: Secondary | ICD-10-CM | POA: Insufficient documentation

## 2022-05-04 DIAGNOSIS — I5023 Acute on chronic systolic (congestive) heart failure: Secondary | ICD-10-CM | POA: Insufficient documentation

## 2022-05-04 DIAGNOSIS — Z79899 Other long term (current) drug therapy: Secondary | ICD-10-CM | POA: Diagnosis not present

## 2022-05-04 DIAGNOSIS — E785 Hyperlipidemia, unspecified: Secondary | ICD-10-CM | POA: Diagnosis present

## 2022-05-04 LAB — LIPID PANEL
Cholesterol: 129 mg/dL (ref 0–200)
HDL: 49 mg/dL
LDL Cholesterol: 62 mg/dL (ref 0–99)
Total CHOL/HDL Ratio: 2.6 ratio
Triglycerides: 91 mg/dL
VLDL: 18 mg/dL (ref 0–40)

## 2022-05-04 LAB — DIFFERENTIAL
Abs Immature Granulocytes: 0 10*3/uL (ref 0.00–0.07)
Band Neutrophils: 0 %
Basophils Absolute: 0 10*3/uL (ref 0.0–0.1)
Basophils Relative: 0 %
Blasts: 0 %
Eosinophils Absolute: 0.1 10*3/uL (ref 0.0–0.5)
Eosinophils Relative: 1 %
Lymphocytes Relative: 36 %
Lymphs Abs: 2.2 10*3/uL (ref 0.7–4.0)
Metamyelocytes Relative: 0 %
Monocytes Absolute: 0.5 10*3/uL (ref 0.1–1.0)
Monocytes Relative: 8 %
Myelocytes: 0 %
Neutro Abs: 3.2 10*3/uL (ref 1.7–7.7)
Neutrophils Relative %: 55 %
Other: 0 %
Promyelocytes Relative: 0 %
nRBC: 0 /100 WBC

## 2022-05-04 LAB — RAPID URINE DRUG SCREEN, HOSP PERFORMED
Amphetamines: NOT DETECTED
Barbiturates: NOT DETECTED
Benzodiazepines: NOT DETECTED
Cocaine: NOT DETECTED
Opiates: NOT DETECTED
Tetrahydrocannabinol: POSITIVE — AB

## 2022-05-04 LAB — URINALYSIS, ROUTINE W REFLEX MICROSCOPIC
Bacteria, UA: NONE SEEN
Bilirubin Urine: NEGATIVE
Glucose, UA: >=500 mg/dL — AB
Ketones, ur: NEGATIVE mg/dL
Leukocytes,Ua: NEGATIVE
Nitrite: NEGATIVE
Protein, ur: 30 mg/dL — AB
Specific Gravity, Urine: 1.028 (ref 1.005–1.030)
pH: 5 (ref 5.0–8.0)

## 2022-05-04 LAB — ECHOCARDIOGRAM COMPLETE BUBBLE STUDY
Area-P 1/2: 3.77 cm2
Calc EF: 40.2 %
S' Lateral: 4.6 cm
Single Plane A2C EF: 41.5 %
Single Plane A4C EF: 41.3 %

## 2022-05-04 LAB — BASIC METABOLIC PANEL
Anion gap: 12 (ref 5–15)
BUN: 13 mg/dL (ref 6–20)
CO2: 25 mmol/L (ref 22–32)
Calcium: 9.5 mg/dL (ref 8.9–10.3)
Chloride: 102 mmol/L (ref 98–111)
Creatinine, Ser: 1.13 mg/dL (ref 0.61–1.24)
GFR, Estimated: >60 mL/min (ref >60)
Glucose, Bld: 114 mg/dL — ABNORMAL HIGH (ref 70–99)
Potassium: 3.7 mmol/L (ref 3.5–5.1)
Sodium: 139 mmol/L (ref 135–145)

## 2022-05-04 LAB — I-STAT CHEM 8, ED
BUN: 13 mg/dL (ref 6–20)
Calcium, Ion: 1.04 mmol/L — ABNORMAL LOW (ref 1.15–1.40)
Chloride: 103 mmol/L (ref 98–111)
Creatinine, Ser: 1 mg/dL (ref 0.61–1.24)
Glucose, Bld: 131 mg/dL — ABNORMAL HIGH (ref 70–99)
HCT: 44 % (ref 39.0–52.0)
Hemoglobin: 15 g/dL (ref 13.0–17.0)
Potassium: 3.7 mmol/L (ref 3.5–5.1)
Sodium: 140 mmol/L (ref 135–145)
TCO2: 23 mmol/L (ref 22–32)

## 2022-05-04 LAB — CBC
HCT: 44.2 % (ref 39.0–52.0)
Hemoglobin: 15.5 g/dL (ref 13.0–17.0)
MCH: 30.1 pg (ref 26.0–34.0)
MCHC: 35.1 g/dL (ref 30.0–36.0)
MCV: 85.8 fL (ref 80.0–100.0)
Platelets: 123 10*3/uL — ABNORMAL LOW (ref 150–400)
RBC: 5.15 MIL/uL (ref 4.22–5.81)
RDW: 12.9 % (ref 11.5–15.5)
WBC: 6 10*3/uL (ref 4.0–10.5)
nRBC: 0 % (ref 0.0–0.2)

## 2022-05-04 LAB — TROPONIN I (HIGH SENSITIVITY)
Troponin I (High Sensitivity): 6 ng/L (ref <18)
Troponin I (High Sensitivity): 4 ng/L (ref <18)

## 2022-05-04 LAB — HIV ANTIBODY (ROUTINE TESTING W REFLEX): HIV Screen 4th Generation wRfx: NONREACTIVE

## 2022-05-04 LAB — PROTIME-INR
INR: 1.2 (ref 0.8–1.2)
Prothrombin Time: 14.8 seconds (ref 11.4–15.2)

## 2022-05-04 LAB — HEMOGLOBIN A1C
Hgb A1c MFr Bld: 6 % — ABNORMAL HIGH (ref 4.8–5.6)
Mean Plasma Glucose: 125.5 mg/dL

## 2022-05-04 LAB — APTT: aPTT: 30 seconds (ref 24–36)

## 2022-05-04 MED ORDER — ACETAMINOPHEN 325 MG PO TABS: 650.0000 mg | ORAL_TABLET | Freq: Four times a day (QID) | ORAL | Status: DC | PRN

## 2022-05-04 MED ORDER — IOHEXOL 350 MG/ML SOLN
75.0000 mL | Freq: Once | INTRAVENOUS | Status: AC | PRN
  Administered 2022-05-04: 75 mL via INTRAVENOUS

## 2022-05-04 MED ORDER — LORATADINE 10 MG PO TABS
10.0000 mg | ORAL_TABLET | Freq: Every day | ORAL | Status: DC
  Administered 2022-05-04: 10 mg via ORAL
  Filled 2022-05-04: qty 1

## 2022-05-04 MED ORDER — FLUTICASONE PROPIONATE 50 MCG/ACT NA SUSP: 1.0000 | Freq: Every day | NASAL | Status: DC

## 2022-05-04 MED ORDER — SODIUM CHLORIDE 0.9 % IV SOLN: INTRAVENOUS | Status: DC

## 2022-05-04 MED ORDER — TRAZODONE HCL 50 MG PO TABS: 25.0000 mg | ORAL_TABLET | Freq: Every evening | ORAL | Status: DC | PRN

## 2022-05-04 MED ORDER — TRAMADOL HCL 50 MG PO TABS: 50.0000 mg | ORAL_TABLET | Freq: Three times a day (TID) | ORAL | Status: DC | PRN

## 2022-05-04 MED ORDER — ENOXAPARIN SODIUM 40 MG/0.4ML IJ SOSY
40.0000 mg | PREFILLED_SYRINGE | INTRAMUSCULAR | Status: DC
  Administered 2022-05-04: 40 mg via SUBCUTANEOUS
  Filled 2022-05-04: qty 0.4

## 2022-05-04 MED ORDER — FUROSEMIDE 20 MG PO TABS
20.0000 mg | ORAL_TABLET | Freq: Every day | ORAL | Status: DC
  Administered 2022-05-04: 20 mg via ORAL
  Filled 2022-05-04: qty 1

## 2022-05-04 MED ORDER — SPIRONOLACTONE 12.5 MG HALF TABLET
25.0000 mg | ORAL_TABLET | Freq: Every day | ORAL | Status: DC
  Administered 2022-05-04: 25 mg via ORAL
  Filled 2022-05-04: qty 2

## 2022-05-04 MED ORDER — ONDANSETRON HCL 4 MG PO TABS: 4.0000 mg | ORAL_TABLET | Freq: Four times a day (QID) | ORAL | Status: DC | PRN

## 2022-05-04 MED ORDER — METHOCARBAMOL 500 MG PO TABS: 500.0000 mg | ORAL_TABLET | Freq: Two times a day (BID) | ORAL | Status: DC | PRN

## 2022-05-04 MED ORDER — ONDANSETRON HCL 4 MG/2ML IJ SOLN
4.0000 mg | Freq: Once | INTRAMUSCULAR | Status: AC
  Administered 2022-05-04: 4 mg via INTRAVENOUS
  Filled 2022-05-04: qty 2

## 2022-05-04 MED ORDER — ADULT MULTIVITAMIN W/MINERALS CH
1.0000 | ORAL_TABLET | Freq: Every day | ORAL | Status: DC
  Administered 2022-05-04: 1 via ORAL
  Filled 2022-05-04: qty 1

## 2022-05-04 MED ORDER — STROKE: EARLY STAGES OF RECOVERY BOOK
Status: AC
  Filled 2022-05-04: qty 1

## 2022-05-04 MED ORDER — ASPIRIN 81 MG PO TBEC
81.0000 mg | DELAYED_RELEASE_TABLET | Freq: Every day | ORAL | Status: DC
  Administered 2022-05-04: 81 mg via ORAL
  Filled 2022-05-04: qty 1

## 2022-05-04 MED ORDER — GABAPENTIN 300 MG PO CAPS: 300.0000 mg | ORAL_CAPSULE | Freq: Every day | ORAL | Status: DC

## 2022-05-04 MED ORDER — VITAMIN D 25 MCG (1000 UNIT) PO TABS
5000.0000 [IU] | ORAL_TABLET | Freq: Every day | ORAL | Status: DC
  Administered 2022-05-04: 5000 [IU] via ORAL
  Filled 2022-05-04: qty 5

## 2022-05-04 MED ORDER — SACUBITRIL-VALSARTAN 97-103 MG PO TABS
1.0000 | ORAL_TABLET | Freq: Two times a day (BID) | ORAL | Status: DC
  Administered 2022-05-04: 1 via ORAL
  Filled 2022-05-04 (×2): qty 1

## 2022-05-04 MED ORDER — ONDANSETRON HCL 4 MG/2ML IJ SOLN: 4.0000 mg | Freq: Four times a day (QID) | INTRAMUSCULAR | Status: DC | PRN

## 2022-05-04 MED ORDER — TICAGRELOR 90 MG PO TABS
90.0000 mg | ORAL_TABLET | Freq: Two times a day (BID) | ORAL | 0 refills | Status: AC
  Filled 2022-05-04: qty 60, 30d supply, fill #0

## 2022-05-04 MED ORDER — CLOPIDOGREL BISULFATE 75 MG PO TABS: 75.0000 mg | ORAL_TABLET | Freq: Every day | ORAL | 1 refills | Status: DC

## 2022-05-04 MED ORDER — DAPAGLIFLOZIN PROPANEDIOL 10 MG PO TABS
10.0000 mg | ORAL_TABLET | Freq: Every day | ORAL | Status: DC
  Administered 2022-05-04: 10 mg via ORAL
  Filled 2022-05-04: qty 1

## 2022-05-04 MED ORDER — STROKE: EARLY STAGES OF RECOVERY BOOK: Freq: Once | Status: AC

## 2022-05-04 MED ORDER — NITROGLYCERIN 0.4 MG SL SUBL: 0.4000 mg | SUBLINGUAL_TABLET | SUBLINGUAL | Status: DC | PRN

## 2022-05-04 MED ORDER — BRIMONIDINE TARTRATE 0.15 % OP SOLN: 1.0000 [drp] | Freq: Two times a day (BID) | OPHTHALMIC | Status: DC

## 2022-05-04 MED ORDER — ATORVASTATIN CALCIUM 40 MG PO TABS
80.0000 mg | ORAL_TABLET | Freq: Every day | ORAL | Status: DC
  Administered 2022-05-04: 80 mg via ORAL
  Filled 2022-05-04: qty 2

## 2022-05-04 MED ORDER — ACETAMINOPHEN 650 MG RE SUPP: 650.0000 mg | Freq: Four times a day (QID) | RECTAL | Status: DC | PRN

## 2022-05-04 MED ORDER — LATANOPROST 0.005 % OP SOLN: 1.0000 [drp] | Freq: Every day | OPHTHALMIC | Status: DC

## 2022-05-04 MED ORDER — TICAGRELOR 90 MG PO TABS: 90.0000 mg | ORAL_TABLET | Freq: Two times a day (BID) | ORAL | Status: DC

## 2022-05-04 MED ORDER — SODIUM CHLORIDE 0.9 % IV BOLUS
1000.0000 mL | Freq: Once | INTRAVENOUS | Status: AC
  Administered 2022-05-04: 1000 mL via INTRAVENOUS

## 2022-05-04 MED ORDER — MAGNESIUM HYDROXIDE 400 MG/5ML PO SUSP: 30.0000 mL | Freq: Every day | ORAL | Status: DC | PRN

## 2022-05-04 MED ORDER — CLOPIDOGREL BISULFATE 75 MG PO TABS: 75.0000 mg | ORAL_TABLET | Freq: Every day | ORAL | Status: DC

## 2022-05-04 MED ORDER — PERFLUTREN LIPID MICROSPHERE
1.0000 mL | INTRAVENOUS | Status: AC | PRN
  Administered 2022-05-04: 3 mL via INTRAVENOUS
  Filled 2022-05-04: qty 10

## 2022-05-04 MED ORDER — CARVEDILOL 12.5 MG PO TABS
25.0000 mg | ORAL_TABLET | Freq: Two times a day (BID) | ORAL | Status: DC
  Administered 2022-05-04: 25 mg via ORAL
  Filled 2022-05-04: qty 2

## 2022-05-04 NOTE — Progress Notes (Signed)
[facility] PROGRESS NOTE   TEREK BEE WYO:[id number] DOB: 01-10-1961 DOA: 05/04/2022  PCP: Loura Pardon, MD  Brief History/Interval Summary: 62 y.o. African-American Tucker with medical history significant for coronary artery disease s/p PCI and stent, s/p CABG, CVA, prediabetes, hypertension, dyslipidemia and chronic systolic heart failure, who presented to the emergency room with difficulty speaking, right hand discoordination and dizziness.  Concern is for TIA versus stroke.  Patient was hospitalized for further management.    Consultants: Neurology.  Procedures: Echocardiogram is pending    Subjective/Interval History: Patient mentions that his symptoms have resolved.  He denies any headache.  No chest pain shortness of breath.  No nausea or vomiting.     Assessment/Plan:  TIA vs CVA MRI is pending.  Patient does have a AICD.  Will need to be coordinated with electrophysiology. Neurology has been consulted. Patient noted to be on aspirin. Echocardiogram is pending. LDL is 62.  Patient is already on a statin. HbA1c is pending. PT OT evaluation.  Chronic systolic CHF/AICD in situ Followed by cardiology in the outpatient setting.  Last echocardiogram is from 2022 which showed EF to be 25-30 percent. Patient seems to be on medical therapy including carvedilol, Farxiga, furosemide, Entresto and spironolactone.  Coronary artery disease Stable.  Continue medications as outlined above.  Obesity Estimated body mass index is 31.4 kg/m as calculated from the following:   Height as of this encounter: '6\' 1"'$  (1.854 m).   Weight as of this encounter: 108 kg.   DVT Prophylaxis: Lovenox Code Status: Full code Family Communication: Discussed with the patient Disposition Plan: To be determined  Status is: Observation The patient remains OBS appropriate and may d/c before 2 midnights.      Medications: Scheduled:  [START ON 05/05/2022]  stroke: early  stages of recovery book   Does not apply Once    stroke: early stages of recovery book       aspirin EC  81 mg Oral Daily   atorvastatin  80 mg Oral Daily   brimonidine  1 drop Both Eyes BID   carvedilol  25 mg Oral BID WC   cholecalciferol  5,000 Units Oral Daily   dapagliflozin propanediol  10 mg Oral Daily   enoxaparin (LOVENOX) injection  40 mg Subcutaneous Q24H   fluticasone  1 spray Each Nare Daily   furosemide  20 mg Oral Daily   gabapentin  300 mg Oral QHS   latanoprost  1 drop Both Eyes QHS   loratadine  10 mg Oral Daily   multivitamin with minerals  1 tablet Oral Daily   sacubitril-valsartan  1 tablet Oral BID   spironolactone  25 mg Oral Daily   Continuous: DXA:JOINOM: early stages of recovery book, acetaminophen **OR** acetaminophen, magnesium hydroxide, methocarbamol, nitroGLYCERIN, ondansetron **OR** ondansetron (ZOFRAN) IV, traMADol, traZODone  Antibiotics: Anti-infectives (From admission, onward)    None       Objective:  Vital Signs  Vitals:   05/04/22 0500 05/04/22 0507 05/04/22 0510 05/04/22 0630  BP: 96/77 111/79  (!) 117/105  Pulse: (!) 59 68  62  Resp: '19 Robert  19  '$ Temp:   97.7 F (36.5 C)   TempSrc:   Oral   SpO2: 97% 96%  97%  Weight:      Height:        Intake/Output Summary (Last 24 hours) at 05/04/2022 0933 Last data filed at 05/04/2022 0612 Gross per 24 hour  Intake 1000 ml  Output --  Net 1000  ml   Filed Weights   05/04/22 0118  Weight: 108 kg    General appearance: Awake alert.  In no distress Resp: Clear to auscultation bilaterally.  Normal effort Cardio: S1-S2 is normal regular.  No S3-S4.  No rubs murmurs or bruit GI: Abdomen is soft.  Nontender nondistended.  Bowel sounds are present normal.  No masses organomegaly Extremities: No edema.  Full range of motion of lower extremities. .    Lab Results:  Data Reviewed: I have personally reviewed following labs and reports of the imaging studies  CBC: Recent Labs  Lab  05/04/22 0130 05/04/22 0242  WBC 6.0  --   NEUTROABS 3.2  --   HGB 15.5 15.0  HCT 44.2 44.0  MCV 85.8  --   PLT 123*  --     Basic Metabolic Panel: Recent Labs  Lab 05/04/22 0130 05/04/22 0242  NA 139 140  K 3.7 3.7  CL 102 103  CO2 25  --   GLUCOSE 114* 131*  BUN 13 13  CREATININE 1.13 1.00  CALCIUM 9.5  --     GFR: Estimated Creatinine Clearance: 101.2 mL/min (by C-G formula based on SCr of 1 mg/dL).   Coagulation Profile: Recent Labs  Lab 05/04/22 0130  INR 1.2      Lipid Profile: Recent Labs    05/04/22 0621  CHOL 129  HDL 49  LDLCALC 62  TRIG 91  CHOLHDL 2.6     Radiology Studies: CT ANGIO HEAD NECK W WO CM  Result Date: 05/04/2022 CLINICAL DATA:  Episode of near syncope and dizziness EXAM: CT ANGIOGRAPHY HEAD AND NECK TECHNIQUE: Multidetector CT imaging of the head and neck was performed using the standard protocol during bolus administration of intravenous contrast. Multiplanar CT image reconstructions and MIPs were obtained to evaluate the vascular anatomy. Carotid stenosis measurements (when applicable) are obtained utilizing NASCET criteria, using the distal internal carotid diameter as the denominator. RADIATION DOSE REDUCTION: This exam was performed according to the departmental dose-optimization program which includes automated exposure control, adjustment of the mA and/or kV according to patient size and/or use of iterative reconstruction technique. CONTRAST:  76m OMNIPAQUE IOHEXOL 350 MG/ML SOLN COMPARISON:  03/29/2018 CTA head and neck FINDINGS: CT HEAD FINDINGS Brain: No evidence of acute infarct, hemorrhage, mass, mass effect, or midline shift. No hydrocephalus or extra-axial fluid collection. Redemonstrated encephalomalacia in the bilateral cerebral and cerebellar hemispheres. Vascular: No hyperdense vessel. Skull: Normal. Negative for fracture or focal lesion. Sinuses/Orbits: No acute finding. Other: The mastoid air cells are well aerated.  CTA NECK FINDINGS Aortic arch: Two-vessel arch with a common origin of the brachiocephalic and left common carotid arteries. Imaged portion shows no evidence of aneurysm or dissection. No significant stenosis of the major arch vessel origins. Aortic atherosclerosis Right carotid system: No evidence of dissection, occlusion, or hemodynamically significant stenosis (greater than 50%). Left carotid system: No evidence of dissection, occlusion, or hemodynamically significant stenosis (greater than 50%). Vertebral arteries: No evidence of dissection, occlusion, or hemodynamically significant stenosis (greater than 50%). Skeleton: No acute osseous abnormality. Edentulous. Status post median sternotomy. Degenerative changes in the cervical spine. Other neck: Negative. Upper chest: No focal pulmonary opacity or pleural effusion. Centrilobular emphysema. Review of the MIP images confirms the above findings CTA HEAD FINDINGS Anterior circulation: Both internal carotid arteries are patent to the termini, without significant stenosis. A1 segments patent. 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 Anterior cerebral arteries  are patent to their distal aspects. No M1 stenosis or occlusion. MCA branches perfused and symmetric. Posterior circulation: Vertebral arteries patent to the vertebrobasilar junction without stenosis. Posterior inferior cerebellar arteries patent proximally. Basilar patent to its distal aspect. Superior cerebellar arteries patent proximally. Patent left P1. Hypoplastic or aplastic right P1. Fetal origin the right PCA from the right posterior communicating artery. The left posterior communicating artery is also patent. PCAs perfused to their distal aspects without stenosis. Venous sinuses: As permitted by contrast timing, patent. Anatomic variants: Fetal origin the right PCA. Review of the MIP images confirms the above findings IMPRESSION: 1. No  acute intracranial process. 2. No intracranial large vessel occlusion or significant stenosis. 3. No hemodynamically significant stenosis in the neck. 4. Aortic atherosclerosis. 5. Emphysema. Aortic Atherosclerosis (ICD10-I70.0) and Emphysema (ICD10-J43.9). Electronically Signed   By: Merilyn Baba M.D.   On: 05/04/2022 03:23   DG Chest 1 View  Result Date: 05/04/2022 CLINICAL DATA:  Shortness of breath and dizziness EXAM: PORTABLE CHEST 1 VIEW COMPARISON:  11/30/2020 FINDINGS: Cardiac shadow is stable. Postsurgical changes are noted. Defibrillator is again seen. Lungs are well aerated bilaterally without focal infiltrate. No bony abnormality is noted. IMPRESSION: No active disease. Electronically Signed   By: Inez Catalina M.D.   On: 05/04/2022 01:53       LOS: 0 days   [facility] Pager on [email or web address]  05/04/2022, 9:33 AM

## 2022-05-04 NOTE — ED Notes (Signed)
MRI exam complete, pacemaker rep at bedside at this time.

## 2022-05-04 NOTE — Progress Notes (Signed)
OT Cancellation Note  Patient Details Name: [name] MRN: [id number] DOB: February 12, 1961   Cancelled Treatment:    Reason Eval/Treat Not Completed: OT screened, no needs identified, will sign off Per PT, no OT needs at this time with R hand near baseline and functional with ADL tasks.   Layla Maw 05/04/2022, 1:21 PM

## 2022-05-04 NOTE — Assessment & Plan Note (Addendum)
-  I counseled him for smoking cessation and he will receive further counseling here. - We will utilize NicoDerm CQ patch as needed.

## 2022-05-04 NOTE — Progress Notes (Signed)
Echocardiogram 2D Echocardiogram has been performed.  Frances Furbish 05/04/2022, 11:29 AM

## 2022-05-04 NOTE — Progress Notes (Addendum)
STROKE TEAM PROGRESS NOTE   INTERVAL HISTORY Patient is seen in his room with no family at the bedside.  Last night, he experienced sudden onset nausea, disequilibrium and incoordination of his right hand which lasted about 30 minutes and then resolved spontaneously.  Concern for TIA versus small stroke, MRI still pending.  Vitals:   05/04/22 0510 05/04/22 0630 05/04/22 1136 05/04/22 1140  BP:  (!) 117/105  100/70  Pulse:  62  66  Resp:  19  (!) 26  Temp: 97.7 F (36.5 C)  98.1 F (36.7 C)   TempSrc: Oral  Oral   SpO2:  97%  97%  Weight:      Height:       CBC:  Recent Labs  Lab 05/04/22 0130 05/04/22 0242  WBC 6.0  --   NEUTROABS 3.2  --   HGB 15.5 15.0  HCT 44.2 44.0  MCV 85.8  --   PLT 123*  --    Basic Metabolic Panel:  Recent Labs  Lab 05/04/22 0130 05/04/22 0242  NA 139 140  K 3.7 3.7  CL 102 103  CO2 25  --   GLUCOSE 114* 131*  BUN 13 13  CREATININE 1.13 1.00  CALCIUM 9.5  --    Lipid Panel:  Recent Labs  Lab 05/04/22 0621  CHOL 129  TRIG 91  HDL 49  CHOLHDL 2.6  VLDL 18  LDLCALC 62   HgbA1c:  Recent Labs  Lab 05/04/22 0621  HGBA1C 6.0*   Urine Drug Screen:  Recent Labs  Lab 05/04/22 0135  LABOPIA NONE DETECTED  COCAINSCRNUR NONE DETECTED  LABBENZ NONE DETECTED  AMPHETMU NONE DETECTED  THCU POSITIVE*  LABBARB NONE DETECTED    Alcohol Level No results for input(s): "ETH" in the last 168 hours.  IMAGING past 24 hours ECHOCARDIOGRAM COMPLETE BUBBLE STUDY  Result Date: 05/04/2022    ECHOCARDIOGRAM REPORT   Patient Name:   [name] Date of Exam: 05/04/2022 Medical Rec #:  [id number]         Height:       73.0 in Accession #:    [id number]        Weight:       238.0 lb Date of Birth:  10/03/1960          BSA:          2.317 m Patient Age:    62 years          BP:           117/105 mmHg Patient Gender: M                 HR:           55 bpm. Exam Location:  Inpatient Procedure: 2D Echo, Cardiac Doppler, Color Doppler, Saline Contrast  Bubble Study            and Intracardiac Opacification Agent Indications:    stroke  History:        Patient has prior history of Echocardiogram examinations.                 Cardiomyopathy, CAD and Pericardial Disease, Defibrillator and                 Prior CABG, Stroke, Arrythmias:Bradycardia; Risk                 Factors:Dyslipidemia.  Sonographer:    Phineas Douglas Referring Phys: [id number] Michie  1. Left  ventricular ejection fraction, by estimation, is 35 to 40%. The left ventricle has moderately decreased function. The left ventricle demonstrates regional wall motion abnormalities (see scoring diagram/findings for description). Inferior/inferolateral akinesis. There is mild left ventricular hypertrophy. Left ventricular diastolic parameters are consistent with Grade II diastolic dysfunction (pseudonormalization). Elevated left atrial pressure.  2. Right ventricular systolic function was not well visualized. The right ventricular size is not well visualized.  3. The mitral valve is normal in structure. No evidence of mitral valve regurgitation. No evidence of mitral stenosis.  4. The aortic valve is tricuspid. Aortic valve regurgitation is not visualized. No aortic stenosis is present.  5. Agitated saline contrast bubble study was positive with shunting observed within 3-6 cardiac cycles suggestive of interatrial shunt. Poor quality images on bubble study, but significant shunting seen FINDINGS  Left Ventricle: Left ventricular ejection fraction, by estimation, is 35 to 40%. The left ventricle has moderately decreased function. The left ventricle demonstrates regional wall motion abnormalities. Definity contrast agent was given IV to delineate the left ventricular endocardial borders. The left ventricular internal cavity size was normal in size. There is mild left ventricular hypertrophy. Left ventricular diastolic parameters are consistent with Grade II diastolic dysfunction  (pseudonormalization). Elevated left atrial pressure.  LV Wall Scoring: The entire inferior wall and posterior wall are akinetic. The inferior septum is hypokinetic. The entire anterior wall, antero-lateral wall, anterior septum, apical lateral segment, and apex are normal. Right Ventricle: The right ventricular size is not well visualized. Right vetricular wall thickness was not well visualized. Right ventricular systolic function was not well visualized. Left Atrium: Left atrial size was normal in size. Right Atrium: Right atrial size was normal in size. Pericardium: There is no evidence of pericardial effusion. Mitral Valve: The mitral valve is normal in structure. No evidence of mitral valve regurgitation. No evidence of mitral valve stenosis. Tricuspid Valve: The tricuspid valve is normal in structure. Tricuspid valve regurgitation is trivial. Aortic Valve: The aortic valve is tricuspid. Aortic valve regurgitation is not visualized. No aortic stenosis is present. Pulmonic Valve: The pulmonic valve was grossly normal. Pulmonic valve regurgitation is trivial. Aorta: The aortic root and ascending aorta are structurally normal, with no evidence of dilitation. IAS/Shunts: The interatrial septum was not well visualized. Agitated saline contrast was given intravenously to evaluate for intracardiac shunting. Agitated saline contrast bubble study was positive with shunting observed within 3-6 cardiac cycles suggestive of interatrial shunt.  LEFT VENTRICLE PLAX 2D LVIDd:         5.30 cm      Diastology LVIDs:         4.60 cm      LV e' medial:    3.23 cm/s LV PW:         1.20 cm      LV E/e' medial:  20.8 LV IVS:        1.00 cm      LV e' lateral:   8.93 cm/s LVOT diam:     2.20 cm      LV E/e' lateral: 7.5 LV SV:         67 LV SV Index:   29 LVOT Area:     3.80 cm  LV Volumes (MOD) LV vol d, MOD A2C: 217.0 ml LV vol d, MOD A4C: 172.0 ml LV vol s, MOD A2C: 127.0 ml LV vol s, MOD A4C: 101.0 ml LV SV MOD A2C:     90.0 ml  LV SV MOD A4C:  172.0 ml LV SV MOD BP:      78.7 ml RIGHT VENTRICLE RV Basal diam:  3.50 cm RV S prime:     5.78 cm/s TAPSE (M-mode): 0.9 cm LEFT ATRIUM             Index        RIGHT ATRIUM           Index LA diam:        4.10 cm 1.77 cm/m   RA Area:     20.60 cm LA Vol (A2C):   68.7 ml 29.65 ml/m  RA Volume:   53.80 ml  23.22 ml/m LA Vol (A4C):   53.0 ml 22.88 ml/m LA Biplane Vol: 67.7 ml 29.22 ml/m  AORTIC VALVE LVOT Vmax:   70.50 cm/s LVOT Vmean:  49.300 cm/s LVOT VTI:    0.177 m  AORTA Ao Root diam: 3.80 cm Ao Asc diam:  3.20 cm MITRAL VALVE MV Area (PHT): 3.77 cm    SHUNTS MV Decel Time: 201 msec    Systemic VTI:  0.18 m MV E velocity: 67.30 cm/s  Systemic Diam: 2.20 cm MV A velocity: 75.40 cm/s MV E/A ratio:  0.89 Oswaldo Milian MD Electronically signed by Oswaldo Milian MD Signature Date/Time: 05/04/2022/12:05:17 PM    Final    CT ANGIO HEAD NECK W WO CM  Result Date: 05/04/2022 CLINICAL DATA:  Episode of near syncope and dizziness EXAM: CT ANGIOGRAPHY HEAD AND NECK TECHNIQUE: Multidetector CT imaging of the head and neck was performed using the standard protocol during bolus administration of intravenous contrast. Multiplanar CT image reconstructions and MIPs were obtained to evaluate the vascular anatomy. Carotid stenosis measurements (when applicable) are obtained utilizing NASCET criteria, using the distal internal carotid diameter as the denominator. RADIATION DOSE REDUCTION: This exam was performed according to the departmental dose-optimization program which includes automated exposure control, adjustment of the mA and/or kV according to patient size and/or use of iterative reconstruction technique. CONTRAST:  60m OMNIPAQUE IOHEXOL 350 MG/ML SOLN COMPARISON:  03/29/2018 CTA head and neck FINDINGS: CT HEAD FINDINGS Brain: No evidence of acute infarct, hemorrhage, mass, mass effect, or midline shift. No hydrocephalus or extra-axial fluid collection. Redemonstrated  encephalomalacia in the bilateral cerebral and cerebellar hemispheres. Vascular: No hyperdense vessel. Skull: Normal. Negative for fracture or focal lesion. Sinuses/Orbits: No acute finding. Other: The mastoid air cells are well aerated. CTA NECK FINDINGS Aortic arch: Two-vessel arch with a common origin of the brachiocephalic and left common carotid arteries. Imaged portion shows no evidence of aneurysm or dissection. No significant stenosis of the major arch vessel origins. Aortic atherosclerosis Right carotid system: No evidence of dissection, occlusion, or hemodynamically significant stenosis (greater than 50%). Left carotid system: No evidence of dissection, occlusion, or hemodynamically significant stenosis (greater than 50%). Vertebral arteries: No evidence of dissection, occlusion, or hemodynamically significant stenosis (greater than 50%). Skeleton: No acute osseous abnormality. Edentulous. Status post median sternotomy. Degenerative changes in the cervical spine. Other neck: Negative. Upper chest: No focal pulmonary opacity or pleural effusion. Centrilobular emphysema. Review of the MIP images confirms the above findings CTA HEAD FINDINGS Anterior circulation: Both internal carotid arteries are patent to the termini, without significant stenosis. A1 segments patent. 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 Anterior cerebral arteries are patent to their distal aspects. No M1 stenosis or occlusion. MCA branches perfused and symmetric. Posterior circulation: Vertebral arteries patent to the vertebrobasilar junction without stenosis. Posterior inferior  cerebellar arteries patent proximally. Basilar patent to its distal aspect. Superior cerebellar arteries patent proximally. Patent left P1. Hypoplastic or aplastic right P1. Fetal origin the right PCA from the right posterior communicating artery. The left posterior communicating artery is also  patent. PCAs perfused to their distal aspects without stenosis. Venous sinuses: As permitted by contrast timing, patent. Anatomic variants: Fetal origin the right PCA. Review of the MIP images confirms the above findings IMPRESSION: 1. No acute intracranial process. 2. No intracranial large vessel occlusion or significant stenosis. 3. No hemodynamically significant stenosis in the neck. 4. Aortic atherosclerosis. 5. Emphysema. Aortic Atherosclerosis (ICD10-I70.0) and Emphysema (ICD10-J43.9). Electronically Signed   By: Merilyn Baba M.D.   On: 05/04/2022 03:23   DG Chest 1 View  Result Date: 05/04/2022 CLINICAL DATA:  Shortness of breath and dizziness EXAM: PORTABLE CHEST 1 VIEW COMPARISON:  11/30/2020 FINDINGS: Cardiac shadow is stable. Postsurgical changes are noted. Defibrillator is again seen. Lungs are well aerated bilaterally without focal infiltrate. No bony abnormality is noted. IMPRESSION: No active disease. Electronically Signed   By: Inez Catalina M.D.   On: 05/04/2022 01:53    PHYSICAL EXAM General: Alert, well-developed, well-nourished patient in no acute distress Respiratory: Regular, unlabored respirations on room air  NEURO:  Mental Status: AA&Ox3  Speech/Language: speech is without dysarthria or aphasia.    Cranial Nerves:  II: PERRL.  Deficits in bilateral lower visual fields III, IV, VI: EOMI. Eyelids elevate symmetrically.  V: Sensation is intact to light touch and symmetrical to face.  VII: Smile is symmetrical.   VIII: hearing intact to voice. IX, X: Palate elevates symmetrically. Phonation is normal.  ZO:[id number] shrug 5/5. XII: tongue is midline without fasciculations. Motor: 5/5 strength to all muscle groups tested.  Tone: is normal and bulk is normal Sensation- Intact to light touch bilaterally with some tingling to the right side, this is baseline Coordination: FTN intact bilaterally.No drift.  Diminished fine finger movements on the left Gait-  deferred    ASSESSMENT/PLAN [name] is a 62 y.o. male with history of tobacco abuse, CAD, hyperlipidemia and prior strokes presenting with  sudden onset nausea, disequilibrium and incoordination of his right hand which lasted about 30 minutes and then resolved spontaneously.  Concern for TIA versus small stroke, MRI still pending.  Likely brainstem TIA versus small stroke CT head No acute abnormality.  CTA head & neck no LVO or hemodynamically significant stenosis MRI pending 2D Echo EF 35 to 40% with inferolateral akinesis, mild LVH, grade 2 diastolic dysfunction, bubble study positive, suggestive of interatrial shunt, normal left atrial size LDL 62 HgbA1c 6.0 VTE prophylaxis -Lovenox    Diet   Diet Heart Room service appropriate? Yes; Fluid consistency: Thin   aspirin 81 mg daily and Plavix daily prior to admission, now on aspirin 81 mg daily and Brilinta (ticagrelor) 90 mg bid for 4 weeks followed by aspirin and Plavix Therapy recommendations: Pending Disposition: Pending  Hypertension Home meds: Carvedilol 25 mg twice daily, spironolactone 25 mg daily Stable Permissive hypertension (OK if < 220/120) but gradually normalize in 5-7 days Long-term BP goal normotensive  Hyperlipidemia Home meds: Atorvastatin 80 mg daily, resumed in hospital LDL 62, goal < 70 Continue statin at discharge   Other Stroke Risk Factors Cigarette smoker advised to stop smoking Substance abuse - UDS:  THC POSITIVE, Cocaine NONE DETECTED. Patient advised to stop using due to stroke risk. Obesity, Body mass index is 31.4 kg/m., BMI >/= 30 associated with increased stroke  risk, recommend weight loss, diet and exercise as appropriate  Hx stroke Coronary artery disease Congestive heart failure  Other Active Problems None  Hospital day # [place] , MSN, AGACNP-BC [facility] See Amion for schedule and pager information 05/04/2022 1:47 PM   STROKE MD  NOTE :  I have personally obtained history,examined this patient, reviewed notes, independently viewed imaging studies, participated in medical decision making and plan of care.ROS completed by me personally and pertinent positives fully documented  I have made any additions or clarifications directly to the above note. Agree with note above.  He presented with sudden onset of dizziness nausea and ataxia likely secondary to brainstem TIA versus small infarct appears to have improved.  Continue ongoing stroke workup and check MRI scan of the brain.  Recommend aspirin and Brilinta for 4 weeks followed by aspirin and Plavix due to cost issues and maintain aggressive risk factor modification.  Mobilize out of bed.  Therapy consults.  Discussed with patient and Dr. [place] Pink.  Greater than 50% time during this 50-minute visit was spent in counseling and coordination of care about his dizziness and ataxia evaluation and treatment plan and answering questions. MRI shows embolic right cerebellar infarct..  Recommend 30-day heart monitor for paroxysmal A-fib at discharge .  Discussed with Dr. Margaretha Glassing, MD Medical Director [place] Pager: [phone] 05/04/2022 2:19 PM  To contact Stroke Continuity provider, please refer to [email or web address] After hours, contact General Neurology

## 2022-05-04 NOTE — Discharge Summary (Signed)
[facility]  Physician Discharge Summary   Patient ID: [name] MRN: [id number] DOB/AGE: October 24, 1960 62 y.o.  Admit date: 05/04/2022 Discharge date: 05/04/2022    PCP: Loura Pardon, MD  DISCHARGE DIAGNOSES:   Acute CVA   Tobacco abuse   Coronary artery disease   Dyslipidemia   Chronic systolic CHF (congestive heart failure) (HCC)   Expressive dysphasia   Unsteady gait   Near syncope   RECOMMENDATIONS FOR OUTPATIENT FOLLOW UP: Cardiology to arrange heart monitor Ambulatory referral sent to neurology   Home Health: None Equipment/Devices: None  CODE STATUS: Full code  DISCHARGE CONDITION: fair  Diet recommendation: Heart healthy  INITIAL HISTORY: 62 y.o. African-American male with medical history significant for coronary artery disease s/p PCI and stent, s/p CABG, CVA, prediabetes, hypertension, dyslipidemia and chronic systolic heart failure, who presented to the emergency room with difficulty speaking, right hand discoordination and dizziness.  Concern is for TIA versus stroke.  Patient was hospitalized for further management.     Consultants: Neurology.   Procedures: Echocardiogram   HOSPITAL COURSE:   Acute CVA Patient underwent MRI which showed a small stroke. Patient was seen by neurology.  Patient started on aspirin and Brilinta to be taken for 4 weeks followed by aspirin and Plavix.  Echocardiogram shows EF to be 35 to 40%.  Seen by physical therapy.  No needs identified. LDL is 62.  Patient is already on a statin. HbA1c is 6.0.   Chronic systolic CHF/AICD in situ Followed by cardiology in the outpatient setting.  Last echocardiogram is from 2022 which showed EF to be 25-30 percent.  Current echocardiogram shows improvement in systolic function. Patient seems to be on medical therapy including carvedilol, Farxiga, furosemide, Entresto and spironolactone. Outpatient follow-up with cardiology.   Coronary artery disease Stable.   Continue medications as outlined above.   Obesity Estimated body mass index is 31.4 kg/m as calculated from the following:   Height as of this encounter: '6\' 1"'$  (1.854 m).   Weight as of this encounter: 108 kg.   Patient is stable.  Okay for discharge home today.  Discussed with neurology and patient.   PERTINENT LABS:  The results of significant diagnostics from this hospitalization (including imaging, microbiology, ancillary and laboratory) are listed below for reference.     Labs:   Basic Metabolic Panel: Recent Labs  Lab 05/04/22 0130 05/04/22 0242  NA 139 140  K 3.7 3.7  CL 102 103  CO2 25  --   GLUCOSE 114* 131*  BUN 13 13  CREATININE 1.13 1.00  CALCIUM 9.5  --     CBC: Recent Labs  Lab 05/04/22 0130 05/04/22 0242  WBC 6.0  --   NEUTROABS 3.2  --   HGB 15.5 15.0  HCT 44.2 44.0  MCV 85.8  --   PLT 123*  --     IMAGING STUDIES MR BRAIN WO CONTRAST  Result Date: 05/04/2022 CLINICAL DATA:  Transient ischemic attack (TIA) EXAM: MRI HEAD WITHOUT CONTRAST TECHNIQUE: Multiplanar, multiecho pulse sequences of the brain and surrounding structures were obtained without intravenous contrast. COMPARISON:  Same day CTA head/neck. FINDINGS: Brain: Acute right cerebellar infarct. Associated edema without significant mass effect. Many remote infarcts including infarcts in bilateral frontal lobes, bilateral parietal lobes and bilateral cerebellum. Scattered additional T2/FLAIR hyperintensities the white matter, compatible with gliosis and chronic microvascular ischemic disease. No evidence of mass lesion, midline shift, acute hemorrhage or hydrocephalus. Scattered small foci of susceptibility artifact, compatible with chronic microhemorrhages. Vascular:  Major arterial flow voids are maintained at the skull base. Skull and upper cervical spine: Normal marrow signal. Sinuses/Orbits: Clear sinuses.  No acute orbital findings. IMPRESSION: 1. Acute right cerebellar infarct. 2. Many  remote infarcts infratentorially and supratentorially. Electronically Signed   By: Margaretha Sheffield M.D.   On: 05/04/2022 16:38   ECHOCARDIOGRAM COMPLETE BUBBLE STUDY  Result Date: 05/04/2022    ECHOCARDIOGRAM REPORT   Patient Name:   [name] Date of Exam: 05/04/2022 Medical Rec #:  [id number]         Height:       73.0 in Accession #:    [id number]        Weight:       238.0 lb Date of Birth:  Apr 12, 1961          BSA:          2.317 m Patient Age:    30 years          BP:           117/105 mmHg Patient Gender: M                 HR:           55 bpm. Exam Location:  Inpatient Procedure: 2D Echo, Cardiac Doppler, Color Doppler, Saline Contrast Bubble Study            and Intracardiac Opacification Agent Indications:    stroke  History:        Patient has prior history of Echocardiogram examinations.                 Cardiomyopathy, CAD and Pericardial Disease, Defibrillator and                 Prior CABG, Stroke, Arrythmias:Bradycardia; Risk                 Factors:Dyslipidemia.  Sonographer:    Phineas Douglas Referring Phys: [id number] [place]  1. Left ventricular ejection fraction, by estimation, is 35 to 40%. The left ventricle has moderately decreased function. The left ventricle demonstrates regional wall motion abnormalities (see scoring diagram/findings for description). Inferior/inferolateral akinesis. There is mild left ventricular hypertrophy. Left ventricular diastolic parameters are consistent with Grade II diastolic dysfunction (pseudonormalization). Elevated left atrial pressure.  2. Right ventricular systolic function was not well visualized. The right ventricular size is not well visualized.  3. The mitral valve is normal in structure. No evidence of mitral valve regurgitation. No evidence of mitral stenosis.  4. The aortic valve is tricuspid. Aortic valve regurgitation is not visualized. No aortic stenosis is present.  5. Agitated saline contrast bubble study was positive with  shunting observed within 3-6 cardiac cycles suggestive of interatrial shunt. Poor quality images on bubble study, but significant shunting seen FINDINGS  Left Ventricle: Left ventricular ejection fraction, by estimation, is 35 to 40%. The left ventricle has moderately decreased function. The left ventricle demonstrates regional wall motion abnormalities. Definity contrast agent was given IV to delineate the left ventricular endocardial borders. The left ventricular internal cavity size was normal in size. There is mild left ventricular hypertrophy. Left ventricular diastolic parameters are consistent with Grade II diastolic dysfunction (pseudonormalization). Elevated left atrial pressure.  LV Wall Scoring: The entire inferior wall and posterior wall are akinetic. The inferior septum is hypokinetic. The entire anterior wall, antero-lateral wall, anterior septum, apical lateral segment, and apex are normal. Right Ventricle: The right ventricular size is not well visualized. Right  vetricular wall thickness was not well visualized. Right ventricular systolic function was not well visualized. Left Atrium: Left atrial size was normal in size. Right Atrium: Right atrial size was normal in size. Pericardium: There is no evidence of pericardial effusion. Mitral Valve: The mitral valve is normal in structure. No evidence of mitral valve regurgitation. No evidence of mitral valve stenosis. Tricuspid Valve: The tricuspid valve is normal in structure. Tricuspid valve regurgitation is trivial. Aortic Valve: The aortic valve is tricuspid. Aortic valve regurgitation is not visualized. No aortic stenosis is present. Pulmonic Valve: The pulmonic valve was grossly normal. Pulmonic valve regurgitation is trivial. Aorta: The aortic root and ascending aorta are structurally normal, with no evidence of dilitation. IAS/Shunts: The interatrial septum was not well visualized. Agitated saline contrast was given intravenously to evaluate for  intracardiac shunting. Agitated saline contrast bubble study was positive with shunting observed within 3-6 cardiac cycles suggestive of interatrial shunt.  LEFT VENTRICLE PLAX 2D LVIDd:         5.30 cm      Diastology LVIDs:         4.60 cm      LV e' medial:    3.23 cm/s LV PW:         1.20 cm      LV E/e' medial:  20.8 LV IVS:        1.00 cm      LV e' lateral:   8.93 cm/s LVOT diam:     2.20 cm      LV E/e' lateral: 7.5 LV SV:         67 LV SV Index:   29 LVOT Area:     3.80 cm  LV Volumes (MOD) LV vol d, MOD A2C: 217.0 ml LV vol d, MOD A4C: 172.0 ml LV vol s, MOD A2C: 127.0 ml LV vol s, MOD A4C: 101.0 ml LV SV MOD A2C:     90.0 ml LV SV MOD A4C:     172.0 ml LV SV MOD BP:      78.7 ml RIGHT VENTRICLE RV Basal diam:  3.50 cm RV S prime:     5.78 cm/s TAPSE (M-mode): 0.9 cm LEFT ATRIUM             Index        RIGHT ATRIUM           Index LA diam:        4.10 cm 1.77 cm/m   RA Area:     20.60 cm LA Vol (A2C):   68.7 ml 29.65 ml/m  RA Volume:   53.80 ml  23.22 ml/m LA Vol (A4C):   53.0 ml 22.88 ml/m LA Biplane Vol: 67.7 ml 29.22 ml/m  AORTIC VALVE LVOT Vmax:   70.50 cm/s LVOT Vmean:  49.300 cm/s LVOT VTI:    0.177 m  AORTA Ao Root diam: 3.80 cm Ao Asc diam:  3.20 cm MITRAL VALVE MV Area (PHT): 3.77 cm    SHUNTS MV Decel Time: 201 msec    Systemic VTI:  0.18 m MV E velocity: 67.30 cm/s  Systemic Diam: 2.20 cm MV A velocity: 75.40 cm/s MV E/A ratio:  0.89 Oswaldo Milian MD Electronically signed by Oswaldo Milian MD Signature Date/Time: 05/04/2022/12:05:17 PM    Final    CT ANGIO HEAD NECK W WO CM  Result Date: 05/04/2022 CLINICAL DATA:  Episode of near syncope and dizziness EXAM: CT ANGIOGRAPHY HEAD AND NECK TECHNIQUE: Multidetector CT imaging of the  head and neck was performed using the standard protocol during bolus administration of intravenous contrast. Multiplanar CT image reconstructions and MIPs were obtained to evaluate the vascular anatomy. Carotid stenosis measurements (when  applicable) are obtained utilizing NASCET criteria, using the distal internal carotid diameter as the denominator. RADIATION DOSE REDUCTION: This exam was performed according to the departmental dose-optimization program which includes automated exposure control, adjustment of the mA and/or kV according to patient size and/or use of iterative reconstruction technique. CONTRAST:  49m OMNIPAQUE IOHEXOL 350 MG/ML SOLN COMPARISON:  03/29/2018 CTA head and neck FINDINGS: CT HEAD FINDINGS Brain: No evidence of acute infarct, hemorrhage, mass, mass effect, or midline shift. No hydrocephalus or extra-axial fluid collection. Redemonstrated encephalomalacia in the bilateral cerebral and cerebellar hemispheres. Vascular: No hyperdense vessel. Skull: Normal. Negative for fracture or focal lesion. Sinuses/Orbits: No acute finding. Other: The mastoid air cells are well aerated. CTA NECK FINDINGS Aortic arch: Two-vessel arch with a common origin of the brachiocephalic and left common carotid arteries. Imaged portion shows no evidence of aneurysm or dissection. No significant stenosis of the major arch vessel origins. Aortic atherosclerosis Right carotid system: No evidence of dissection, occlusion, or hemodynamically significant stenosis (greater than 50%). Left carotid system: No evidence of dissection, occlusion, or hemodynamically significant stenosis (greater than 50%). Vertebral arteries: No evidence of dissection, occlusion, or hemodynamically significant stenosis (greater than 50%). Skeleton: No acute osseous abnormality. Edentulous. Status post median sternotomy. Degenerative changes in the cervical spine. Other neck: Negative. Upper chest: No focal pulmonary opacity or pleural effusion. Centrilobular emphysema. Review of the MIP images confirms the above findings CTA HEAD FINDINGS Anterior circulation: Both internal carotid arteries are patent to the termini, without significant stenosis. A1 segments patent. 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 Anterior cerebral arteries are patent to their distal aspects. No M1 stenosis or occlusion. MCA branches perfused and symmetric. Posterior circulation: Vertebral arteries patent to the vertebrobasilar junction without stenosis. Posterior inferior cerebellar arteries patent proximally. Basilar patent to its distal aspect. Superior cerebellar arteries patent proximally. Patent left P1. Hypoplastic or aplastic right P1. Fetal origin the right PCA from the right posterior communicating artery. The left posterior communicating artery is also patent. PCAs perfused to their distal aspects without stenosis. Venous sinuses: As permitted by contrast timing, patent. Anatomic variants: Fetal origin the right PCA. Review of the MIP images confirms the above findings IMPRESSION: 1. No acute intracranial process. 2. No intracranial large vessel occlusion or significant stenosis. 3. No hemodynamically significant stenosis in the neck. 4. Aortic atherosclerosis. 5. Emphysema. Aortic Atherosclerosis (ICD10-I70.0) and Emphysema (ICD10-J43.9). Electronically Signed   By: AMerilyn BabaM.D.   On: 05/04/2022 03:23   DG Chest 1 View  Result Date: 05/04/2022 CLINICAL DATA:  Shortness of breath and dizziness EXAM: PORTABLE CHEST 1 VIEW COMPARISON:  11/30/2020 FINDINGS: Cardiac shadow is stable. Postsurgical changes are noted. Defibrillator is again seen. Lungs are well aerated bilaterally without focal infiltrate. No bony abnormality is noted. IMPRESSION: No active disease. Electronically Signed   By: MInez CatalinaM.D.   On: 05/04/2022 01:53    DISCHARGE EXAMINATION: See progress note from earlier today    DISPOSITION: Home  Discharge Instructions     Ambulatory referral to Neurology   Complete by: As directed    An appointment is requested in approximately: 8 weeks   Call MD for:  difficulty breathing, headache or visual  disturbances   Complete by: As directed  Call MD for:  extreme fatigue   Complete by: As directed    Call MD for:  persistant dizziness or light-headedness   Complete by: As directed    Call MD for:  persistant nausea and vomiting   Complete by: As directed    Call MD for:  severe uncontrolled pain   Complete by: As directed    Call MD for:  temperature >100.4   Complete by: As directed    Diet - low sodium heart healthy   Complete by: As directed    Discharge instructions   Complete by: As directed    Please take your medications as prescribed.  Please be sure to follow-up with your primary care provider in 1 week.  Neurologist has recommended a heart monitor.  A message will be sent to your cardiologist so that this can be arranged.  You should hear from them sometime next week.  You were cared for by a hospitalist during your hospital stay. If you have any questions about your discharge medications or the care you received while you were in the hospital after you are discharged, you can call the unit and asked to speak with the hospitalist on call if the hospitalist that took care of you is not available. Once you are discharged, your primary care physician will handle any further medical issues. Please note that NO REFILLS for any discharge medications will be authorized once you are discharged, as it is imperative that you return to your primary care physician (or establish a relationship with a primary care physician if you do not have one) for your aftercare needs so that they can reassess your need for medications and monitor your lab values. If you do not have a primary care physician, you can call [phone] for a physician referral.   Increase activity slowly   Complete by: As directed           Allergies as of 05/04/2022   No Known Allergies      Medication List     STOP taking these medications    traMADol 50 MG tablet Commonly known as: ULTRAM       TAKE these  medications    Alphagan P 0.1 % Soln Generic drug: brimonidine INSTILL 1 DROP INTO BOTH EYES TWICE A DAY What changed: when to take this   aspirin EC 81 MG tablet Take 1 tablet (81 mg total) by mouth daily. Restart on 07/05/2018   atorvastatin 80 MG tablet Commonly known as: LIPITOR TAKE 1 TABLET EVERY DAY   Brilinta 90 MG Tabs tablet Generic drug: ticagrelor Take 1 tablet (90 mg total) by mouth 2 (two) times daily.   carvedilol 25 MG tablet Commonly known as: COREG TAKE 1 TABLET BY MOUTH TWICE A DAY WITH A MEAL What changed: See the new instructions.   cetirizine 10 MG tablet Commonly known as: ZYRTEC Take 1 tablet (10 mg total) by mouth daily.   clopidogrel 75 MG tablet Commonly known as: Plavix Take 1 tablet (75 mg total) by mouth daily. Start after 30 day course of brilinta has been completed Start taking on: June 04, 2022   Entresto 97-103 MG Generic drug: sacubitril-valsartan TAKE 1 TABLET TWICE DAILY   Farxiga 10 MG Tabs tablet Generic drug: dapagliflozin propanediol TAKE 1 TABLET EVERY DAY What changed: how much to take   fluticasone 50 MCG/ACT nasal spray Commonly known as: FLONASE Place 1 spray into both nostrils daily.   furosemide 20 MG tablet Commonly known  as: LASIX Take 1 tablet (20 mg total) by mouth daily.   gabapentin 300 MG capsule Commonly known as: NEURONTIN Take 1 capsule (300 mg total) by mouth at bedtime.   latanoprost 0.005 % ophthalmic solution Commonly known as: XALATAN Place 1 drop into both eyes at bedtime.   methocarbamol 500 MG tablet Commonly known as: ROBAXIN TAKE 1 TABLET (500 MG TOTAL) BY MOUTH 2 (TWO) TIMES DAILY AS NEEDED FOR MUSCLE SPASMS.   multivitamin with minerals Tabs tablet Take 1 tablet by mouth daily. Centrum Silver   nicotine 7 mg/24hr patch Commonly known as: NICODERM CQ - dosed in mg/24 hr Place 1 patch (7 mg total) onto the skin daily.   nitroGLYCERIN 0.4 MG SL tablet Commonly known as:  NITROSTAT Place 1 tablet (0.4 mg total) under the tongue every 5 (five) minutes x 3 doses as needed for chest pain.   spironolactone 25 MG tablet Commonly known as: ALDACTONE TAKE 1 TABLET EVERY DAY   Vitamin D 125 MCG (5000 UT) Caps Take 5,000 Units by mouth daily.           TOTAL DISCHARGE TIME: 35 minutes  Attilio Zeitler [employer] on [email or web address]  05/05/2022, 11:34 AM

## 2022-05-04 NOTE — TOC Transition Note (Signed)
Discharge medications (1) are being stored in the main pharmacy on the ground floor until patient is ready for discharge.   

## 2022-05-04 NOTE — H&P (Signed)
[place]   PATIENT NAME: [name]    MR#:  [id number]  DATE OF BIRTH:  20-Sep-1960  DATE OF ADMISSION:  05/04/2022  PRIMARY CARE PHYSICIAN: Loura Pardon, MD   Patient is coming from: Home  REQUESTING/REFERRING PHYSICIAN: Maudie Flakes, MD   CHIEF COMPLAINT:   Chief Complaint  Patient presents with   Near Syncope    HISTORY OF PRESENT ILLNESS:  [name] is a 62 y.o. African-American male with medical history significant for coronary artery disease s/p PCI and stent, s/p CABG, CVA, prediabetes, hypertension, dyslipidemia and chronic systolic heart failure, who presented to the emergency room with a [place] of dizziness with right hand discoordination as well as expressive dysphagia, mild headache and abnormal gait that started while he was playing a game tonight on his phone.  Symptoms resolved within half an hour.  He denies any chest pain or palpitations.  No tinnitus or vertigo.  No urinary or stool incontinence.  No weakness seizures.  No nausea or vomiting or abdominal pain.  No dysuria, oliguria or hematuria or flank pain.  He continues to smoke 2 packs of cigarettes per day and has been smoking for long time.  ED Course: When he can to the ER, BP was 99/64 and later 104/84 with otherwise normal vital signs.  Labs revealed unremarkable BMP and slightly ionized calcium.  Sensitive troponin was 6 and later 4.  CBC showed mild thrombocytopenia 123.  UA showed more than 500 glucose and 21-50 RBCs, 0-5 WBCs with 30 protein.  Urine drug screen was positive for THC EKG as reviewed by me : Sinus rhythm with a rate of 62, left axis deviation and T wave inversion inferolaterally. Imaging: Portable chest x-ray showed no acute cardiopulmonary disease. CT of the head and CT a of the head and neck showed the following: 1. No acute intracranial process. 2. No intracranial large vessel occlusion or significant stenosis. 3. No hemodynamically significant stenosis in  the neck. 4. Aortic atherosclerosis. 5. Emphysema.  The patient was given 4 mg IV Zofran and 1 L bolus of IV normal saline.  He will be admitted to a medical telemetry observation bed for further evaluation and management. PAST MEDICAL HISTORY:   Past Medical History:  Diagnosis Date   Abnormal EKG    hx of ischemia showing up on ekg's   Acute myopericarditis    a. 03/2013: readm with hypoxia, tachycardia, elevated ESR/CRP, elevated troponin with Dressler syndrome and myopericarditis; treated with steroids.   Acute respiratory failure ([place])    a. 01/2013: VDRF. b. 03/2013: hypoxia requiring supp O2 during adm, resolved by discharge.   C. difficile colitis    a. 01/2013 during prolonged adm. b. Recurred 03/2013.   Cardiac tamponade    a. 01/2013 s/p drain.   Cataracts, bilateral    Coronary artery disease    a. s/p MI in 2009 in [place] with stenting of the LCX and LAD;  b. 12/2012 NSTEMI/CAD: LM nl, LAD patent prox stent, LCX 50-70 isr (FFR 0.84), RCA dom, 19m EF 40-45%-->Med Rx. c. 01/2013: anterolateral STEMI complicated by pericardial effusion (presumed purulent pericarditis) and tamponade s/p drain, ruptured LV pseudoaneurysm s/p CorMatrix patch, CABGx1 (SVG-OM1), fever, CVA, VDRF, C diff.   CVA (cerebral infarction)    a. 01/2013 in setting of prolonged hospitalization - residual L arm weakness.   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 03/2013: readm with hypoxia, tachycardia, elevated ESR/CRP, elevated troponin with Dressler syndrome and myopericarditis;  treated with steroids.   Glaucoma    Hyperlipidemia    Hypokalemia    Hyponatremia    a. During late 2014.   Ischemic cardiomyopathy    a. Sept/Oct 2014: EF ~40% (ICM). b. 03/2013: EF 25-30%. Off ACEI due to hypotension (MIXED NICM/ICM).   Optic neuropathy, ischemic    Pericardial effusion    a. 01/2013: pericardial effusion (presumed purulent pericarditis) and cardiac tamponade s/p drain. b. Persistent moderate pericardial effusion  03/2013.   Pre-diabetes    Pseudoaneurysm of left ventricle of heart    a. 01/2013:  Ruptured inferoposterior LV pseudoaneurysm s/p CorMatrix patch.   Sinus bradycardia    asymptomatic   Stroke [facility]) 2009   weak lt side-lt arm   Tobacco abuse    Wears dentures    top    PAST SURGICAL HISTORY:   Past Surgical History:  Procedure Laterality Date   CARDIAC CATHETERIZATION  01/06/2013   CATARACT EXTRACTION Bilateral 05/2021   COLONOSCOPY WITH PROPOFOL N/A 07/03/2016   Procedure: COLONOSCOPY WITH PROPOFOL;  Surgeon: Manus Gunning, MD;  Location: [facility];  Service: Gastroenterology;  Laterality: N/A;   CORONARY ANGIOPLASTY WITH STENT PLACEMENT  2000's X 2   "1 + 1" (01/06/2013)   CORONARY ARTERY BYPASS GRAFT N/A 01/28/2013   Procedure: CORONARY ARTERY BYPASS GRAFTING (CABG);  Surgeon: Melrose Nakayama, MD;  Location: [place];  Service: Open Heart Surgery;  Laterality: N/A;  CABG x one, using left greater saphenous vein harvested endoscopically   ICD IMPLANT N/A 05/25/2020   Procedure: ICD IMPLANT;  Surgeon: Thompson Grayer, MD;  Location: [place] CV LAB;  Service: Cardiovascular;  Laterality: N/A;   INTRAOPERATIVE TRANSESOPHAGEAL ECHOCARDIOGRAM N/A 01/28/2013   Procedure: INTRAOPERATIVE TRANSESOPHAGEAL ECHOCARDIOGRAM;  Surgeon: Melrose Nakayama, MD;  Location: [place];  Service: Open Heart Surgery;  Laterality: N/A;   LEFT HEART CATH AND CORS/GRAFTS ANGIOGRAPHY N/A 09/13/2020   Procedure: LEFT HEART CATH AND CORS/GRAFTS ANGIOGRAPHY;  Surgeon: Martinique, Peter M, MD;  Location: [place] CV LAB;  Service: Cardiovascular;  Laterality: N/A;   LEFT HEART CATHETERIZATION WITH CORONARY ANGIOGRAM N/A 01/06/2013   Procedure: LEFT HEART CATHETERIZATION WITH CORONARY ANGIOGRAM;  Surgeon: Peter M Martinique, MD;  Location: [facility] CATH LAB;  Service: Cardiovascular;  Laterality: N/A;   LEFT HEART CATHETERIZATION WITH CORONARY ANGIOGRAM N/A 01/21/2013   Procedure: LEFT HEART CATHETERIZATION  WITH CORONARY ANGIOGRAM;  Surgeon: Burnell Blanks, MD;  Location: [facility] CATH LAB;  Service: Cardiovascular;  Laterality: N/A;   LUMBAR LAMINECTOMY/ DECOMPRESSION WITH MET-RX Right 06/30/2018   Procedure: Right minimally invasive Lumbar Three-Four Far lateral discectomy;  Surgeon: Judith Part, MD;  Location: [place];  Service: Neurosurgery;  Laterality: Right;  Right minimally invasive Lumbar Three-Four Far lateral discectomy   MASS EXCISION Right 07/21/2014   Procedure: EXCISION RIGHT NECK MASS;  Surgeon: Erroll Luna, MD;  Location: [place];  Service: General;  Laterality: Right;   PERICARDIAL TAP N/A 01/24/2013   Procedure: PERICARDIAL TAP;  Surgeon: Blane Ohara, MD;  Location: [facility] CATH LAB;  Service: Cardiovascular;  Laterality: N/A;   RIGHT HEART CATHETERIZATION Right 01/24/2013   Procedure: RIGHT HEART CATH;  Surgeon: Blane Ohara, MD;  Location: [facility] CATH LAB;  Service: Cardiovascular;  Laterality: Right;   VENTRICULAR ANEURYSM RESECTION N/A 01/28/2013   Procedure: LEFT VENTRICULAR ANEURYSM REPAIR;  Surgeon: Melrose Nakayama, MD;  Location: [place];  Service: Open Heart Surgery;  Laterality: N/A;    SOCIAL HISTORY:   Social History  Tobacco Use   Smoking status: Every Day    Packs/day: 1.00    Years: 35.00    Total pack years: 35.00    Types: Cigarettes   Smokeless tobacco: Never  Substance Use Topics   Alcohol use: No    Alcohol/week: 0.0 standard drinks of alcohol    FAMILY HISTORY:   Family History  Problem Relation Age of Onset   Heart attack Mother    Diabetes Mother    Hypertension Mother    Hypertension Father    CAD Other    HIV Brother    Stroke Neg Hx     DRUG ALLERGIES:  No Known Allergies  REVIEW OF SYSTEMS:   ROS As per history of present illness. All pertinent systems were reviewed above. Constitutional, HEENT, cardiovascular, respiratory, GI, GU, musculoskeletal, neuro, psychiatric, endocrine, integumentary  and hematologic systems were reviewed and are otherwise negative/unremarkable except for positive findings mentioned above in the HPI.   MEDICATIONS AT HOME:   Prior to Admission medications   Medication Sig Start Date End Date Taking? Authorizing Provider  ALPHAGAN P 0.1 % SOLN INSTILL 1 DROP INTO BOTH EYES TWICE A DAY 10/26/19   Ladell Pier, MD  aspirin 81 MG EC tablet Take 1 tablet (81 mg total) by mouth daily. Restart on 07/05/2018 06/30/18   Judith Part, MD  atorvastatin (LIPITOR) 80 MG tablet TAKE 1 TABLET EVERY DAY 10/11/21   Ladell Pier, MD  carvedilol (COREG) 25 MG tablet TAKE 1 TABLET BY MOUTH TWICE A DAY WITH A MEAL 05/03/21   Martinique, Peter M, MD  cetirizine (ZYRTEC) 10 MG tablet Take 1 tablet (10 mg total) by mouth daily. 08/12/21   Talbot Grumbling, FNP  Cholecalciferol (VITAMIN D) 125 MCG (5000 UT) CAPS Take 5,000 Units by mouth daily.    [provider]  dapagliflozin propanediol (FARXIGA) 10 MG TABS tablet TAKE 1 TABLET EVERY DAY 12/20/21   Thompson Grayer, MD  ENTRESTO 97-103 MG TAKE 1 TABLET TWICE DAILY 05/02/22   Martinique, Peter M, MD  fluticasone [facility]) 50 MCG/ACT nasal spray Place 1 spray into both nostrils daily. 08/12/21   Talbot Grumbling, FNP  furosemide (LASIX) 20 MG tablet Take 1 tablet (20 mg total) by mouth daily. 02/01/21   Martinique, Peter M, MD  furosemide (LASIX) 20 MG tablet TAKE 1 TABLET (20 MG TOTAL) BY MOUTH DAILY AS NEEDED. FOR UPPER AND LOWER EXTREMITY FLUID RETENTION. 12/19/21   Allred, Jeneen Rinks, MD  furosemide (LASIX) 20 MG tablet TAKE 1 TABLET EVERY DAY 01/11/22   Martinique, Peter M, MD  gabapentin (NEURONTIN) 300 MG capsule Take 1 capsule (300 mg total) by mouth at bedtime. 09/18/21   Ladell Pier, MD  latanoprost (XALATAN) 0.005 % ophthalmic solution Place 1 drop into both eyes at bedtime. 05/31/16   [provider]  methocarbamol (ROBAXIN) 500 MG tablet TAKE 1 TABLET (500 MG TOTAL) BY MOUTH 2 (TWO) TIMES DAILY AS NEEDED  FOR MUSCLE SPASMS. 10/18/20   Ladell Pier, MD  Multiple Vitamin (MULTIVITAMIN WITH MINERALS) TABS tablet Take 1 tablet by mouth daily. Centrum Silver    [provider]  nicotine (NICODERM CQ - DOSED IN MG/24 HR) 7 mg/24hr patch Place 1 patch (7 mg total) onto the skin daily. 02/09/21   Ladell Pier, MD  nitroGLYCERIN (NITROSTAT) 0.4 MG SL tablet Place 1 tablet (0.4 mg total) under the tongue every 5 (five) minutes x 3 doses as needed for chest pain. 09/14/20  Reino Bellis B, NP  spironolactone (ALDACTONE) 25 MG tablet TAKE 1 TABLET EVERY DAY 10/02/21   Ladell Pier, MD  traMADol (ULTRAM) 50 MG tablet Take 1 tablet (50 mg total) by mouth every 8 (eight) hours as needed. Patient taking differently: Take 50 mg by mouth every 8 (eight) hours as needed for moderate pain. 07/21/20   Ladell Pier, MD      VITAL SIGNS:  Blood pressure 111/79, pulse 68, temperature 97.7 F (36.5 C), temperature source Oral, resp. rate 18, height '6\' 1"'$  (1.854 m), weight 108 kg, SpO2 96 %.  PHYSICAL EXAMINATION:  Physical Exam  GENERAL:  62 y.o.-year-old African-American male patient lying in the bed with no acute distress.  EYES: Pupils equal, round, reactive to light and accommodation. No scleral icterus. Extraocular muscles intact.  HEENT: Head atraumatic, normocephalic. Oropharynx and nasopharynx clear.  NECK:  Supple, no jugular venous distention. No thyroid enlargement, no tenderness.  LUNGS: Normal breath sounds bilaterally, no wheezing, rales,rhonchi or crepitation. No use of accessory muscles of respiration.  CARDIOVASCULAR: Regular rate and rhythm, S1, S2 normal. No murmurs, rubs, or gallops.  ABDOMEN: Soft, nondistended, nontender. Bowel sounds present. No organomegaly or mass.  EXTREMITIES: No pedal edema, cyanosis, or clubbing.  NEUROLOGIC: Cranial nerves II through XII are intact. Muscle strength 5/5 in all extremities. Sensation intact. Gait not checked.  PSYCHIATRIC:  The patient is alert and oriented x 3.  Normal affect and good eye contact. SKIN: No obvious rash, lesion, or ulcer.   LABORATORY PANEL:   CBC Recent Labs  Lab 05/04/22 0130 05/04/22 0242  WBC 6.0  --   HGB 15.5 15.0  HCT 44.2 44.0  PLT 123*  --    ------------------------------------------------------------------------------------------------------------------  Chemistries  Recent Labs  Lab 05/04/22 0130 05/04/22 0242  NA 139 140  K 3.7 3.7  CL 102 103  CO2 25  --   GLUCOSE 114* 131*  BUN 13 13  CREATININE 1.13 1.00  CALCIUM 9.5  --    ------------------------------------------------------------------------------------------------------------------  Cardiac Enzymes No results for input(s): "TROPONINI" in the last 168 hours. ------------------------------------------------------------------------------------------------------------------  RADIOLOGY:  CT ANGIO HEAD NECK W WO CM  Result Date: 05/04/2022 CLINICAL DATA:  Episode of near syncope and dizziness EXAM: CT ANGIOGRAPHY HEAD AND NECK TECHNIQUE: Multidetector CT imaging of the head and neck was performed using the standard protocol during bolus administration of intravenous contrast. Multiplanar CT image reconstructions and MIPs were obtained to evaluate the vascular anatomy. Carotid stenosis measurements (when applicable) are obtained utilizing NASCET criteria, using the distal internal carotid diameter as the denominator. RADIATION DOSE REDUCTION: This exam was performed according to the departmental dose-optimization program which includes automated exposure control, adjustment of the mA and/or kV according to patient size and/or use of iterative reconstruction technique. CONTRAST:  51m OMNIPAQUE IOHEXOL 350 MG/ML SOLN COMPARISON:  03/29/2018 CTA head and neck FINDINGS: CT HEAD FINDINGS Brain: No evidence of acute infarct, hemorrhage, mass, mass effect, or midline shift. No hydrocephalus or extra-axial fluid  collection. Redemonstrated encephalomalacia in the bilateral cerebral and cerebellar hemispheres. Vascular: No hyperdense vessel. Skull: Normal. Negative for fracture or focal lesion. Sinuses/Orbits: No acute finding. Other: The mastoid air cells are well aerated. CTA NECK FINDINGS Aortic arch: Two-vessel arch with a common origin of the brachiocephalic and left common carotid arteries. Imaged portion shows no evidence of aneurysm or dissection. No significant stenosis of the major arch vessel origins. Aortic atherosclerosis Right carotid system: No evidence of dissection, occlusion, or hemodynamically significant stenosis (greater  than 50%). Left carotid system: No evidence of dissection, occlusion, or hemodynamically significant stenosis (greater than 50%). Vertebral arteries: No evidence of dissection, occlusion, or hemodynamically significant stenosis (greater than 50%). Skeleton: No acute osseous abnormality. Edentulous. Status post median sternotomy. Degenerative changes in the cervical spine. Other neck: Negative. Upper chest: No focal pulmonary opacity or pleural effusion. Centrilobular emphysema. Review of the MIP images confirms the above findings CTA HEAD FINDINGS Anterior circulation: Both internal carotid arteries are patent to the termini, without significant stenosis. A1 segments patent. 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 Anterior cerebral arteries are patent to their distal aspects. No M1 stenosis or occlusion. MCA branches perfused and symmetric. Posterior circulation: Vertebral arteries patent to the vertebrobasilar junction without stenosis. Posterior inferior cerebellar arteries patent proximally. Basilar patent to its distal aspect. Superior cerebellar arteries patent proximally. Patent left P1. Hypoplastic or aplastic right P1. Fetal origin the right PCA from the right posterior communicating artery. The left posterior  communicating artery is also patent. PCAs perfused to their distal aspects without stenosis. Venous sinuses: As permitted by contrast timing, patent. Anatomic variants: Fetal origin the right PCA. Review of the MIP images confirms the above findings IMPRESSION: 1. No acute intracranial process. 2. No intracranial large vessel occlusion or significant stenosis. 3. No hemodynamically significant stenosis in the neck. 4. Aortic atherosclerosis. 5. Emphysema. Aortic Atherosclerosis (ICD10-I70.0) and Emphysema (ICD10-J43.9). Electronically Signed   By: Merilyn Baba M.D.   On: 05/04/2022 03:23   DG Chest 1 View  Result Date: 05/04/2022 CLINICAL DATA:  Shortness of breath and dizziness EXAM: PORTABLE CHEST 1 VIEW COMPARISON:  11/30/2020 FINDINGS: Cardiac shadow is stable. Postsurgical changes are noted. Defibrillator is again seen. Lungs are well aerated bilaterally without focal infiltrate. No bony abnormality is noted. IMPRESSION: No active disease. Electronically Signed   By: Inez Catalina M.D.   On: 05/04/2022 01:53      IMPRESSION AND PLAN:  Assessment and Plan: * TIA (transient ischemic attack) - The patient will be admitted to an observation medically telemetry bed. - This is manifested by right hand discoordination, expressive dysphagia, unsteady gait and headache as well as dizziness. - We will follow neuro checks q.4 hours for 24 hours.   - The patient will be placed on aspirin.   - Will obtain a brain MRI without contrast as well as 2D echo with bubble study. - A neurology consultation was obtained in the ER by Dr. Janeann Forehand. - We will obtain physical/occupation/speech therapy consults as well. - The patient will be placed on statin therapy and fasting lipids will be checked.   Tobacco abuse - I counseled him for smoking cessation and he will receive further counseling here. - We will utilize NicoDerm CQ patch as needed.  Chronic systolic CHF (congestive heart failure) (HCC) - We will  continue the blocker therapy, Entresto, Farxiga, Lasix and Aldactone.  Dyslipidemia - We will continue statin therapy and check fasting lipids.  Coronary artery disease - We will continue aspirin, statin therapy, beta-blocker therapy and as needed sublingual nitroglycerin.       DVT prophylaxis: Lovenox.  Advanced Care Planning:  Code Status: full code.  Family Communication:  The plan of care was discussed in details with the patient (and family). I answered all questions. The patient agreed to proceed with the above mentioned plan. Further management will depend upon hospital course. Disposition Plan: Back to previous home environment Consults called: none.  All the  records are reviewed and case discussed with ED provider.  Status is: Observation  I certify that at the time of admission, it is my clinical judgment that the patient will require hospital care extending less than 2 midnights.                            Dispo: The patient is from: Home              Anticipated d/c is to: Home              Patient currently is not medically stable to d/c.              Difficult to place patient: No  Christel Mormon M.D on 05/04/2022 at 6:10 AM  [facility]   From 7 PM-7 AM, contact night-coverage [email or web address]  CC: Primary care physician; Loura Pardon, MD

## 2022-05-04 NOTE — Assessment & Plan Note (Addendum)
-  The patient will be admitted to an observation medically telemetry bed. - This is manifested by right hand discoordination, expressive dysphagia, unsteady gait and headache as well as dizziness. - We will follow neuro checks q.4 hours for 24 hours.   - The patient will be placed on aspirin.   - Will obtain a brain MRI without contrast as well as 2D echo with bubble study. - A neurology consultation was obtained in the ER by Dr. Janeann Forehand. - We will obtain physical/occupation/speech therapy consults as well. - The patient will be placed on statin therapy and fasting lipids will be checked.

## 2022-05-04 NOTE — ED Provider Notes (Signed)
[facility] Emergency Department Provider Note MRN:  [id number]  Arrival date & time: 05/04/22     Chief Complaint   Near Syncope   History of Present Illness   [name] is a 62 y.o. year-old male with a history of CAD, stroke presenting to the ED with chief complaint of near syncope.  Patient was sitting comfortably on his couch playing a game on his phone.  He felt sudden onset dizziness, nausea, felt like if he stood up he would pass out.  He also noticed that he was having a lot of trouble coordinating his hand to touch his phone.  His finger was wandering.  He is feeling better, still mildly nauseated.  Denies having any chest pain or shortness of breath.  Review of Systems  A thorough review of systems was obtained and all systems are negative except as noted in the HPI and PMH.   Patient's Health History    Past Medical History:  Diagnosis Date   Abnormal EKG    hx of ischemia showing up on ekg's   Acute myopericarditis    a. 03/2013: readm with hypoxia, tachycardia, elevated ESR/CRP, elevated troponin with Dressler syndrome and myopericarditis; treated with steroids.   Acute respiratory failure ([place])    a. 01/2013: VDRF. b. 03/2013: hypoxia requiring supp O2 during adm, resolved by discharge.   C. difficile colitis    a. 01/2013 during prolonged adm. b. Recurred 03/2013.   Cardiac tamponade    a. 01/2013 s/p drain.   Cataracts, bilateral    Coronary artery disease    a. s/p MI in 2009 in [place] with stenting of the LCX and LAD;  b. 12/2012 NSTEMI/CAD: LM nl, LAD patent prox stent, LCX 50-70 isr (FFR 0.84), RCA dom, 16m EF 40-45%-->Med Rx. c. 01/2013: anterolateral STEMI complicated by pericardial effusion (presumed purulent pericarditis) and tamponade s/p drain, ruptured LV pseudoaneurysm s/p CorMatrix patch, CABGx1 (SVG-OM1), fever, CVA, VDRF, C diff.   CVA (cerebral infarction)    a. 01/2013 in setting of prolonged hospitalization -  residual L arm weakness.   [place]ressler syndrome [place]    a. 03/2013: readm with hypoxia, tachycardia, elevated ESR/CRP, elevated troponin with Dressler syndrome and myopericarditis; treated with steroids.   Glaucoma    Hyperlipidemia    Hypokalemia    Hyponatremia    a. During late 2014.   Ischemic cardiomyopathy    a. Sept/Oct 2014: EF ~40% (ICM). b. 03/2013: EF 25-30%. Off ACEI due to hypotension (MIXED NICM/ICM).   Optic neuropathy, ischemic    Pericardial effusion    a. 01/2013: pericardial effusion (presumed purulent pericarditis) and cardiac tamponade s/p drain. b. Persistent moderate pericardial effusion 03/2013.   Pre-diabetes    Pseudoaneurysm of left ventricle of heart    a. 01/2013:  Ruptured inferoposterior LV pseudoaneurysm s/p CorMatrix patch.   Sinus bradycardia    asymptomatic   Stroke ([facility] 2009   weak lt side-lt arm   Tobacco abuse    Wears dentures    top    Past Surgical History:  Procedure Laterality Date   CARDIAC CATHETERIZATION  01/06/2013   CATARACT EXTRACTION Bilateral 05/2021   COLONOSCOPY WITH PROPOFOL N/A 07/03/2016   Procedure: COLONOSCOPY WITH PROPOFOL;  Surgeon: SManus Gunning MD;  [facility];  Service: Gastroenterology;  Laterality: N/A;   CORONARY ANGIOPLASTY WITH STENT PLACEMENT  2000's X 2   "1 + 1" (01/06/2013)   CORONARY ARTERY BYPASS GRAFT N/A 01/28/2013   Procedure: CORONARY ARTERY BYPASS  GRAFTING (CABG);  Surgeon: Melrose Nakayama, MD;  Location: [place];  Service: Open Heart Surgery;  Laterality: N/A;  CABG x one, using left greater saphenous vein harvested endoscopically   ICD IMPLANT N/A 05/25/2020   Procedure: ICD IMPLANT;  Surgeon: Thompson Grayer, MD;  Location: [place] [facility];  Service: Cardiovascular;  Laterality: N/A;   INTRAOPERATIVE TRANSESOPHAGEAL ECHOCARDIOGRAM N/A 01/28/2013   Procedure: INTRAOPERATIVE TRANSESOPHAGEAL ECHOCARDIOGRAM;  Surgeon: Melrose Nakayama, MD;  Location: [place];  Service: Open  Heart Surgery;  Laterality: N/A;   LEFT HEART CATH AND CORS/GRAFTS ANGIOGRAPHY N/A 09/13/2020   Procedure: LEFT HEART CATH AND CORS/GRAFTS ANGIOGRAPHY;  Surgeon: Martinique, Peter M, MD;  Location: [place] [facility];  Service: Cardiovascular;  Laterality: N/A;   LEFT HEART CATHETERIZATION WITH CORONARY ANGIOGRAM N/A 01/06/2013   Procedure: LEFT HEART CATHETERIZATION WITH CORONARY ANGIOGRAM;  Surgeon: Peter M Martinique, MD;  Location: [facility] CATH LAB;  Service: Cardiovascular;  Laterality: N/A;   LEFT HEART CATHETERIZATION WITH CORONARY ANGIOGRAM N/A 01/21/2013   Procedure: LEFT HEART CATHETERIZATION WITH CORONARY ANGIOGRAM;  Surgeon: Burnell Blanks, MD;  Location: [facility] CATH LAB;  Service: Cardiovascular;  Laterality: N/A;   LUMBAR LAMINECTOMY/ DECOMPRESSION WITH MET-RX Right 06/30/2018   Procedure: Right minimally invasive Lumbar Three-Four Far lateral discectomy;  Surgeon: Judith Part, MD;  Location: [place];  Service: Neurosurgery;  Laterality: Right;  Right minimally invasive Lumbar Three-Four Far lateral discectomy   MASS EXCISION Right 07/21/2014   Procedure: EXCISION RIGHT NECK MASS;  Surgeon: Erroll Luna, MD;  Location: [place];  Service: General;  Laterality: Right;   PERICARDIAL TAP N/A 01/24/2013   Procedure: PERICARDIAL TAP;  Surgeon: Blane Ohara, MD;  Location: [facility] CATH LAB;  Service: Cardiovascular;  Laterality: N/A;   RIGHT HEART CATHETERIZATION Right 01/24/2013   Procedure: RIGHT HEART CATH;  Surgeon: Blane Ohara, MD;  Location: [facility] CATH LAB;  Service: Cardiovascular;  Laterality: Right;   VENTRICULAR ANEURYSM RESECTION N/A 01/28/2013   Procedure: LEFT VENTRICULAR ANEURYSM REPAIR;  Surgeon: Melrose Nakayama, MD;  Location: [place];  Service: Open Heart Surgery;  Laterality: N/A;    Family History  Problem Relation Age of Onset   Heart attack Mother    Diabetes Mother    Hypertension Mother    Hypertension Father    CAD Other    HIV Brother     Stroke Neg Hx     Social History   Socioeconomic History   Marital status: Single    Spouse name: Not on file   Number of children: 2   Years of education: 12th   Highest education level: Not on file  Occupational History   Occupation: N/A  Tobacco Use   Smoking status: Every Day    Packs/day: 1.00    Years: 35.00    Total pack years: 35.00    Types: Cigarettes   Smokeless tobacco: Never  Vaping Use   Vaping Use: Never used  Substance and Sexual Activity   Alcohol use: No    Alcohol/week: 0.0 standard drinks of alcohol   Drug use: No   Sexual activity: Yes  Other Topics Concern   Not on file  Social History Narrative   Lives in [place] with wife.  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  He is now retired on disability.  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      Caffeine Use: very rarely   Social Determinants of Health  Financial Resource Strain: Not on file  Food Insecurity: Not on file  Transportation Needs: Not on file  Physical Activity: Not on file  Stress: Not on file  Social Connections: Not on file  Intimate Partner Violence: Not on file     Physical Exam   Vitals:   05/04/22 0430 05/04/22 0445  BP: (!) 73/49   Pulse: 69 71  Resp: 17 17  Temp:    SpO2: 93% 93%    CONSTITUTIONAL: Well-appearing, NAD NEURO/PSYCH:  Alert and oriented x 3, normal and symmetric strength and sensation, normal coordination, normal speech, normal finger-nose-finger test EYES:  eyes equal and reactive ENT/NECK:  no LAD, no JVD CARDIO: Regular rate, well-perfused, normal S1 and S2 PULM:  CTAB no wheezing or rhonchi GI/GU:  non-distended, non-tender MSK/SPINE:  No gross deformities, no edema SKIN:  no rash, atraumatic   *Additional and/or pertinent findings included in MDM below  Diagnostic and Interventional Summary    EKG Interpretation  Date/Time:  Friday May 04 2022 01:12:53 EST Ventricular Rate:  60 PR Interval:  260 QRS Duration: 90 QT  Interval:  448 QTC Calculation: 448 R Axis:   -33 Text Interpretation: Sinus rhythm with 1st degree A-V block Left axis deviation Lateral infarct , age undetermined Inferior infarct , age undetermined Abnormal ECG When compared with ECG of 30-Nov-2020 14:23, PREVIOUS ECG IS PRESENT Confirmed by Merrily Pew [phone]) on 05/04/2022 1:29:10 AM       Labs Reviewed  BASIC METABOLIC PANEL - Abnormal; Notable for the following components:      Result Value   Glucose, Bld 114 (*)    All other components within normal limits  CBC - Abnormal; Notable for the following components:   Platelets 123 (*)    All other components within normal limits  URINALYSIS, ROUTINE W REFLEX MICROSCOPIC - Abnormal; Notable for the following components:   APPearance HAZY (*)    Glucose, UA >=500 (*)    Hgb urine dipstick SMALL (*)    Protein, ur 30 (*)    All other components within normal limits  RAPID URINE DRUG SCREEN, HOSP PERFORMED - Abnormal; Notable for the following components:   Tetrahydrocannabinol POSITIVE (*)    All other components within normal limits  I-STAT CHEM 8, ED - Abnormal; Notable for the following components:   Glucose, Bld 131 (*)    Calcium, Ion 1.04 (*)    All other components within normal limits  PROTIME-INR  APTT  DIFFERENTIAL  CBG MONITORING, ED  TROPONIN I (HIGH SENSITIVITY)  TROPONIN I (HIGH SENSITIVITY)    CT ANGIO HEAD NECK W WO CM  Final Result    DG Chest 1 View  Final Result    MR BRAIN WO CONTRAST    (Results Pending)    Medications  sodium chloride 0.9 % bolus 1,000 mL (has no administration in time range)  ondansetron (ZOFRAN) injection 4 mg (4 mg Intravenous Given 05/04/22 0242)  iohexol (OMNIPAQUE) 350 MG/ML injection 75 mL (75 mLs Intravenous Contrast Given 05/04/22 0311)     Procedures  /  Critical Care .Critical Care  Performed by: Maudie Flakes, MD Authorized by: Maudie Flakes, MD   Critical care provider statement:    Critical care time  (minutes):  35   Critical care was necessary to treat or prevent imminent or life-threatening deterioration of the following conditions: Concern for acute ischemic stroke versus TIA.   Critical care was time spent personally by me on the following activities:  Development of treatment plan with patient or surrogate, discussions with consultants, evaluation of patient's response to treatment, examination of patient, ordering and review of laboratory studies, ordering and review of radiographic studies, ordering and performing treatments and interventions, pulse oximetry, re-evaluation of patient's condition and review of old charts   ED Course and Medical Decision Making  Initial Impression and Ddx Concern for possible TIA given the history provided.  Some dizziness and some coordination issues, all transient, seems to be doing much better.  His neurological exam is normal at this time, no nystagmus, no continued dizziness.  He describes it as more of a lightheadedness.  Other considerations include atypical presentation of ACS.  He never had any chest pain.  Vital signs are normal at this time.  Past medical/surgical history that increases complexity of ED encounter: Stroke, CAD  Interpretation of Diagnostics I personally reviewed the EKG and my interpretation is as follows: Sinus rhythm  Labs reassuring with no significant blood count or electrolyte disturbance  Patient Reassessment and Ultimate Disposition/Management     CTA is without concerning findings.  Case discussed with neurology recommending admission for TIA.  Patient management required discussion with the following services or consulting groups:  Hospitalist Service and Neurology  Complexity of Problems Addressed Acute illness or injury that poses threat of life of bodily function  Additional Data Reviewed and Analyzed Further history obtained from: Further history from spouse/family member  Additional Factors Impacting ED  Encounter Risk Consideration of hospitalization  Barth Kirks. Sedonia Small, Tat Momoli [email or web address]  Final Clinical Impressions(s) / ED Diagnoses     ICD-10-CM   1. Near syncope  R55     2. TIA (transient ischemic attack)  G45.9       ED Discharge Orders     None        Discharge Instructions Discussed with and Provided to Patient:   Discharge Instructions   None      Maudie Flakes, MD 05/04/22 [phone]

## 2022-05-04 NOTE — Assessment & Plan Note (Signed)
-  We will continue statin therapy and check fasting lipids.

## 2022-05-04 NOTE — Assessment & Plan Note (Signed)
-  We will continue the blocker therapy, Entresto, Farxiga, Lasix and Aldactone.

## 2022-05-04 NOTE — TOC Initial Note (Signed)
Transition of Care [facility]) - Initial/Assessment Note    Patient Details  Name: [name] MRN: [id number] Date of Birth: 1960-06-13  [facility] CM/SW Contact:    Verdell Carmine, RN Phone Number: 05/04/2022, 3:05 PM  Clinical Narrative:                    Transitions of Care has reviewed the patient and found no needs at this time. The patient will be discussed on daily progressive rounds. If needs are identified, please place a TOC consult     Patient Goals and CMS Choice            Expected Discharge Plan and Services                                              Prior Living Arrangements/Services                       Activities of Daily Living      Permission Sought/Granted                  Emotional Assessment              Admission diagnosis:  TIA (transient ischemic attack) [G45.9] Patient Active Problem List   Diagnosis Date Noted   TIA (transient ischemic attack) 67/34/1937   Chronic systolic CHF (congestive heart failure) ([place]) 05/04/2022   Expressive dysphasia 05/04/2022   Unsteady gait 05/04/2022   Near syncope 90/24/0973   Chronic systolic heart failure ([place]) 12/01/2021   ICD (implantable cardioverter-defibrillator) in place 12/01/2021   Rectal bleed 03/06/2021   Unstable angina (H[facility])    Hx of CABG 07/09/2019   Lumbar radiculopathy 12/04/2018   Glaucoma 12/04/2018   Cortical age-related cataract of both eyes 12/04/2018   Pain management contract agreement 12/04/2018   Lumbar herniated disc 06/04/2018   Prediabetes 01/01/2018   Hyperlipidemia 12/30/2017   Obesity (BMI 30.0-34.9) 12/30/2017   Benign neoplasm of descending colon    Essential hypertension 11/03/2015   Cardiomyopathy, ischemic 53/29/9242   Chronic systolic dysfunction of left ventricle 06/03/2015   Erectile dysfunction due to arterial insufficiency 12/23/2014   Lipoma of skin and subcutaneous tissue of neck 05/24/2014    History of cardioembolic stroke 68/34/1962   Vision loss of right eye 06/23/2013   Dyslipidemia 06/22/2013   Dressler syndrome ([place])    Coronary artery disease    Cardiomyopathy ([place])    Pseudoaneurysm of left ventricle of heart    Tobacco abuse    PCP:  Loura Pardon, MD Pharmacy:   [id number] [id number]: [phone]: [phone] [facility] [place]Phone: [phone]: [phone]    Social Determinants of Health (SDOH) Social History: SDOH Screenings   Depression (PHQ2-9): Low Risk  (03/06/2021)  Tobacco Use: High Risk (05/04/2022)   SDOH Interventions:     Readmission Risk Interventions     No data to display

## 2022-05-04 NOTE — Assessment & Plan Note (Signed)
-  We will continue aspirin, statin therapy, beta-blocker therapy and as needed sublingual nitroglycerin.

## 2022-05-04 NOTE — Evaluation (Signed)
Physical Therapy Evaluation Patient Details Name: [name] MRN: [id number] DOB: [birth year] Today's Date: 05/04/2022  History of Present Illness  Pt is 62 yo male admitted 05/04/21 with sudden onset nausea, disequilibrium, and incoordination R hand that lasted about 30 mins.  Admitted for likely TIA vs small CVA.  Pt with hx of tobacco abuse, CAD, HLD, prior CVA, CABG, stent, HTN, and CHF  Clinical Impression  Pt admitted with above diagnosis.  At baseline, he is independent with ambulation and denies falls. Reports he does have an aide daily that assist with IADLs.  Today, pt reports he feels that all of his symptoms have resolved. He demonstrates safe and independent transfers and ambulation at normal speed without AD.  Pt scored 23/24 on the DGI indicating low fall risk.  He demonstrates normal cognition and good safety awareness.  In regards to hand coordination, reports feels back to baseline - pt eating/cutting food at arrival and reports using phone without difficulty.  Pt feels to be back to baseline and eager to return home.  Pt with no further PT needs; also, notified OT back to baseline coordination and independence.       Recommendations for follow up therapy are one component of a multi-disciplinary discharge planning process, led by the attending physician.  Recommendations may be updated based on patient status, additional functional criteria and insurance authorization.  Follow Up Recommendations No PT follow up      Assistance Recommended at Discharge PRN  Patient can return home with the following       Equipment Recommendations None recommended by PT  Recommendations for Other Services       Functional Status Assessment Patient has not had a recent decline in their functional status     Precautions / Restrictions Precautions Precautions: None      Mobility  Bed Mobility Overal bed mobility: Independent             General bed mobility comments:  sitting edge of stretcher eating at arrival    Transfers Overall transfer level: Independent Equipment used: None                    Ambulation/Gait Ambulation/Gait assistance: Independent Gait Distance (Feet): 300 Feet Assistive device: None Gait Pattern/deviations: WFL(Within Functional Limits) Gait velocity: normal to fast     General Gait Details: Supervision during therapy but pt at independent level with normal gait  Stairs Stairs: Yes Stairs assistance: Supervision Stair Management: One rail Right Number of Stairs: 5 General stair comments: up/down step stool in ED  Wheelchair Mobility    Modified Rankin (Stroke Patients Only) Modified Rankin (Stroke Patients Only) Pre-Morbid Rankin Score: No significant disability Modified Rankin: No significant disability     Balance Overall balance assessment: Independent Sitting-balance support: No upper extremity supported Sitting balance-Leahy Scale: Normal     Standing balance support: No upper extremity supported Standing balance-Leahy Scale: Normal                   Standardized Balance Assessment Standardized Balance Assessment : Dynamic Gait Index   Dynamic Gait Index Level Surface: Normal Change in Gait Speed: Normal Gait with Horizontal Head Turns: Normal Gait with Vertical Head Turns: Normal Gait and Pivot Turn: Normal Step Over Obstacle: Normal Step Around Obstacles: Normal Steps: Mild Impairment Total Score: 23       Pertinent Vitals/Pain Pain Assessment Pain Assessment: No/denies pain    Home Living Family/patient expects to be discharged to:: Private residence  Living Arrangements: Alone Available Help at Discharge: Personal care attendant (2 hr day/ 7 days per week) Type of Home: Apartment Home Access: Level entry     Alternate Level Stairs-Number of Steps: split level with 2 sets of 4 steps Home Layout: Two level;Other (Comment) Home Equipment: Rolling Walker (2  wheels)      Prior Function Prior Level of Function : Needs assist             Mobility Comments: ambulates in community without AD; no falls ADLs Comments: Aide assist with iadls and shopping; pt independent with adls; does not drive (eyesight)     Hand Dominance   Dominant Hand: Right    Extremity/Trunk Assessment   Upper Extremity Assessment Upper Extremity Assessment: Overall WFL for tasks assessed (Reports R hand back to normal, able to cut and eat food without difficulty, reports back to using phone like normal. Has some coordination deficits L hand baseline from prior CVA but is functional)    Lower Extremity Assessment Lower Extremity Assessment: Overall WFL for tasks assessed (ROM WFL; MMT 5/5, coordination normal)    Cervical / Trunk Assessment Cervical / Trunk Assessment: Normal  Communication   Communication: No difficulties  Cognition Arousal/Alertness: Awake/alert Behavior During Therapy: WFL for tasks assessed/performed Overall Cognitive Status: Within Functional Limits for tasks assessed                                 General Comments: remembered how to get to restroom; able to wayfind back to room        General Comments General comments (skin integrity, edema, etc.): Reports feels at baseline    Exercises     Assessment/Plan    PT Assessment Patient does not need any further PT services  PT Problem List         PT Treatment Interventions      PT Goals (Current goals can be found in the Care Plan section)  Acute Rehab PT Goals Patient Stated Goal: return home PT Goal Formulation: All assessment and education complete, DC therapy    Frequency       Co-evaluation               AM-PAC PT "6 Clicks" Mobility  Outcome Measure Help needed turning from your back to your side while in a flat bed without using bedrails?: None Help needed moving from lying on your back to sitting on the side of a flat bed without using  bedrails?: None Help needed moving to and from a bed to a chair (including a wheelchair)?: None Help needed standing up from a chair using your arms (e.g., wheelchair or bedside chair)?: None Help needed to walk in hospital room?: None Help needed climbing 3-5 steps with a railing? : A Little 6 Click Score: 23    End of Session Equipment Utilized During Treatment: Gait belt Activity Tolerance: Patient tolerated treatment well Patient left: in bed;with call bell/phone within reach (ED sitting on edge of stretcher) Nurse Communication: Mobility status PT Visit Diagnosis: Hemiplegia and hemiparesis Hemiplegia - Right/Left: Right Hemiplegia - dominant/non-dominant: Dominant Hemiplegia - caused by: Unspecified    Time: 0272-5366 PT Time Calculation (min) (ACUTE ONLY): 13 min   Charges:   PT Evaluation $PT Eval Low Complexity: 1 Low          Xoie Kreuser, PT Acute Rehab [facility] [phone]   Karlton Lemon 05/04/2022, 2:15 PM

## 2022-05-04 NOTE — ED Notes (Signed)
Patient transported to CT 

## 2022-05-04 NOTE — ED Notes (Signed)
ED TO INPATIENT HANDOFF REPORT  ED Nurse Name and Phone #: Marye Round RN [phone]  S Name/Age/Gender [name] 62 y.o. male Room/Bed: 001C/001C  Code Status   Code Status: Full Code  Home/SNF/Other Home Patient oriented to: self, place, time, and situation Is this baseline? Yes   Triage Complete: Triage complete  Chief Complaint TIA (transient ischemic attack) [G45.9]  Triage Note Pt states he had episode of near syncope with dizziness, shortness of breath, ringing in left ear, and nausea this evening. Significant cardiac hx. Denies chest pain. States the shob is worse with exertion. 12 lead unremarkable with ems.   BP: 97/55, HR 100   Allergies No Known Allergies  Level of Care/Admitting Diagnosis ED Disposition     ED Disposition  Admit   Condition  --   Comment  Hospital Area: [facility] [[id number]]  Level of Care: Telemetry Medical [104]  Covid Evaluation: Asymptomatic - no recent exposure (last 10 days) testing not required  Diagnosis: TIA (transient ischemic attack) [315176]  Admitting Physician: Christel Mormon [[id number]]  Attending Physician: Christel Mormon [[id number]]          B Medical/Surgery History Past Medical History:  Diagnosis Date   Abnormal EKG    hx of ischemia showing up on ekg's   Acute myopericarditis    a. 03/2013: readm with hypoxia, tachycardia, elevated ESR/CRP, elevated troponin with Dressler syndrome and myopericarditis; treated with steroids.   Acute respiratory failure ([place])    a. 01/2013: VDRF. b. 03/2013: hypoxia requiring supp O2 during adm, resolved by discharge.   C. difficile colitis    a. 01/2013 during prolonged adm. b. Recurred 03/2013.   Cardiac tamponade    a. 01/2013 s/p drain.   Cataracts, bilateral    Coronary artery disease    a. s/p MI in 2009 in [place] with stenting of the LCX and LAD;  b. 12/2012 NSTEMI/CAD: LM nl, LAD patent prox stent, LCX 50-70 isr (FFR 0.84), RCA dom, 142m EF  40-45%-->Med Rx. c. 01/2013: anterolateral STEMI complicated by pericardial effusion (presumed purulent pericarditis) and tamponade s/p drain, ruptured LV pseudoaneurysm s/p CorMatrix patch, CABGx1 (SVG-OM1), fever, CVA, VDRF, C diff.   CVA (cerebral infarction)    a. 01/2013 in setting of prolonged hospitalization - residual L arm weakness.   Dressler syndrome ([place]    a. 03/2013: readm with hypoxia, tachycardia, elevated ESR/CRP, elevated troponin with Dressler syndrome and myopericarditis; treated with steroids.   Glaucoma    Hyperlipidemia    Hypokalemia    Hyponatremia    a. During late 2014.   Ischemic cardiomyopathy    a. Sept/Oct 2014: EF ~40% (ICM). b. 03/2013: EF 25-30%. Off ACEI due to hypotension (MIXED NICM/ICM).   Optic neuropathy, ischemic    Pericardial effusion    a. 01/2013: pericardial effusion (presumed purulent pericarditis) and cardiac tamponade s/p drain. b. Persistent moderate pericardial effusion 03/2013.   Pre-diabetes    Pseudoaneurysm of left ventricle of heart    a. 01/2013:  Ruptured inferoposterior LV pseudoaneurysm s/p CorMatrix patch.   Sinus bradycardia    asymptomatic   Stroke ([facility] 2009   weak lt side-lt arm   Tobacco abuse    Wears dentures    top   Past Surgical History:  Procedure Laterality Date   CARDIAC CATHETERIZATION  01/06/2013   CATARACT EXTRACTION Bilateral 05/2021   COLONOSCOPY WITH PROPOFOL N/A 07/03/2016   Procedure: COLONOSCOPY WITH PROPOFOL;  Surgeon: SManus Gunning MD;  Location: [facility];  Service: Gastroenterology;  Laterality: N/A;   CORONARY ANGIOPLASTY WITH STENT PLACEMENT  2000's X 2   "1 + 1" (01/06/2013)   CORONARY ARTERY BYPASS GRAFT N/A 01/28/2013   Procedure: CORONARY ARTERY BYPASS GRAFTING (CABG);  Surgeon: Melrose Nakayama, MD;  Location: [facility];  Service: Open Heart Surgery;  Laterality: N/A;  CABG x one, using left greater saphenous vein harvested endoscopically   ICD IMPLANT N/A 05/25/2020    Procedure: ICD IMPLANT;  Surgeon: Thompson Grayer, MD;  Location: [place] CV [facility];  Service: Cardiovascular;  Laterality: N/A;   INTRAOPERATIVE TRANSESOPHAGEAL ECHOCARDIOGRAM N/A 01/28/2013   Procedure: INTRAOPERATIVE TRANSESOPHAGEAL ECHOCARDIOGRAM;  Surgeon: Melrose Nakayama, MD;  Location: [place];  Service: Open Heart Surgery;  Laterality: N/A;   LEFT HEART CATH AND CORS/GRAFTS ANGIOGRAPHY N/A 09/13/2020   Procedure: LEFT HEART CATH AND CORS/GRAFTS ANGIOGRAPHY;  Surgeon: Martinique, Peter M, MD;  Location: [place] CV [facility];  Service: Cardiovascular;  Laterality: N/A;   LEFT HEART CATHETERIZATION WITH CORONARY ANGIOGRAM N/A 01/06/2013   Procedure: LEFT HEART CATHETERIZATION WITH CORONARY ANGIOGRAM;  Surgeon: Peter M Martinique, MD;  Location: [facility] CATH LAB;  Service: Cardiovascular;  Laterality: N/A;   LEFT HEART CATHETERIZATION WITH CORONARY ANGIOGRAM N/A 01/21/2013   Procedure: LEFT HEART CATHETERIZATION WITH CORONARY ANGIOGRAM;  Surgeon: Burnell Blanks, MD;  Location: [facility] CATH LAB;  Service: Cardiovascular;  Laterality: N/A;   LUMBAR LAMINECTOMY/ DECOMPRESSION WITH MET-RX Right 06/30/2018   Procedure: Right minimally invasive Lumbar Three-Four Far lateral discectomy;  Surgeon: Judith Part, MD;  Location: [place];  Service: Neurosurgery;  Laterality: Right;  Right minimally invasive Lumbar Three-Four Far lateral discectomy   MASS EXCISION Right 07/21/2014   Procedure: EXCISION RIGHT NECK MASS;  Surgeon: Erroll Luna, MD;  Location: [facility];  Service: General;  Laterality: Right;   PERICARDIAL TAP N/A 01/24/2013   Procedure: PERICARDIAL TAP;  Surgeon: Blane Ohara, MD;  Location: [facility] C[facility] LAB;  Service: Cardiovascular;  Laterality: N/A;   RIGHT HEART CATHETERIZATION Right 01/24/2013   Procedure: RIGHT HEART CATH;  Surgeon: Blane Ohara, MD;  Location: [facility] CATH LAB;  Service: Cardiovascular;  Laterality: Right;   VENTRICULAR ANEURYSM RESECTION N/A 01/28/2013    Procedure: LEFT VENTRICULAR ANEURYSM REPAIR;  Surgeon: Melrose Nakayama, MD;  Location: [place];  Service: Open Heart Surgery;  Laterality: N/A;     A IV Location/Drains/Wounds Patient Lines/Drains/Airways Status     Active Line/Drains/Airways     Name Placement date Placement time Site Days   Peripheral IV 05/04/22 20 G Posterior;Right Hand 05/04/22  0236  Hand  less than 1   Peripheral IV 05/04/22 20 G Anterior;Distal;Right;Upper Arm 05/04/22  0240  Arm  less than 1   Incision (Closed) 07/21/14 Neck Right 07/21/14  1242  -- 2844   Incision (Closed) 06/30/18 Back Other (Comment) 06/30/18  1657  -- 1404            Intake/Output Last 24 hours  Intake/Output Summary (Last 24 hours) at 05/04/2022 1518 Last data filed at 05/04/2022 0612 Gross per 24 hour  Intake 1000 ml  Output --  Net 1000 ml    Labs/Imaging Results for orders placed or performed during the hospital encounter of 05/04/22 (from the past 48 hour(s))  Basic metabolic panel     Status: Abnormal   Collection Time: 05/04/22  1:30 AM  Result Value Ref Range   Sodium 139 135 - 145 mmol/L   Potassium 3.7 3.5 - 5.1 mmol/L   Chloride  102 98 - 111 mmol/L   CO2 25 22 - 32 mmol/L   Glucose, Bld 114 (H) 70 - 99 mg/dL    Comment: Glucose reference range applies only to samples taken after fasting for at least 8 hours.   BUN 13 6 - 20 mg/dL   Creatinine, Ser 1.13 0.61 - 1.24 mg/dL   Calcium 9.5 8.9 - 10.3 mg/dL   GFR, Estimated >60 >60 mL/min    Comment: (NOTE) Calculated using the CKD-EPI Creatinine Equation (2021)    Anion gap 12 5 - 15    Comment: Performed at [facility] [address] [place], [place] [address]  CBC     Status: Abnormal   Collection Time: 05/04/22  1:30 AM  Result Value Ref Range   WBC 6.0 4.0 - 10.5 K/uL   RBC 5.15 4.22 - 5.81 MIL/uL   Hemoglobin 15.5 13.0 - 17.0 g/dL   HCT 44.2 39.0 - 52.0 %   MCV 85.8 80.0 - 100.0 fL   MCH 30.1 26.0 - 34.0 pg   MCHC 35.1 30.0 - 36.0 g/dL    RDW 12.9 11.5 - 15.5 %   Platelets 123 (L) 150 - 400 K/uL   nRBC 0.0 0.0 - 0.2 %    Comment: Performed at [facility], [place] [address], [place], [place] [address]  Protime-INR     Status: None   Collection Time: 05/04/22  1:30 AM  Result Value Ref Range   Prothrombin Time 14.8 11.4 - 15.2 seconds   INR 1.2 0.8 - 1.2    Comment: (NOTE) INR goal varies based on device and disease states. Performed at [facility], [place] [address], [place], [place] [address]   APTT     Status: None   Collection Time: 05/04/22  1:30 AM  Result Value Ref Range   aPTT 30 24 - 36 seconds    Comment: Performed at [facility] [address] [place], [place] [address]  Troponin I (High Sensitivity)     Status: None   Collection Time: 05/04/22  1:30 AM  Result Value Ref Range   Troponin I (High Sensitivity) 6 <18 ng/L    Comment: (NOTE) Elevated high sensitivity troponin I (hsTnI) values and significant  changes across serial measurements may suggest ACS but many other  chronic and acute conditions are known to elevate hsTnI results.  Refer to the "Links" section for chest pain algorithms and additional  guidance. Performed at [facility], [place] [address], [place], [place] [address]   Differential     Status: None   Collection Time: 05/04/22  1:30 AM  Result Value Ref Range   Neutrophils Relative % 55 %   Lymphocytes Relative 36 %   Monocytes Relative 8 %   Eosinophils Relative 1 %   Basophils Relative 0 %   Band Neutrophils 0 %   Metamyelocytes Relative 0 %   Myelocytes 0 %   Promyelocytes Relative 0 %   Blasts 0 %   nRBC 0 0 /100 WBC   Other 0 %   Neutro Abs 3.2 1.7 - 7.7 K/uL   Lymphs Abs 2.2 0.7 - 4.0 K/uL   Monocytes Absolute 0.5 0.1 - 1.0 K/uL   Eosinophils Absolute 0.1 0.0 - 0.5 K/uL   Basophils Absolute 0.0 0.0 - 0.1 K/uL   Abs Immature Granulocytes 0.00 0.00 - 0.07 K/uL    Comment: Performed at [facility], [place]  [address] [place], [place] [address]   Urinalysis, Routine w reflex microscopic Urine, Clean Catch     Status: Abnormal   Collection Time: 05/04/22  1:35 AM  Result Value Ref Range   Color, Urine YELLOW YELLOW   APPearance HAZY (A) CLEAR   Specific Gravity, Urine 1.028 1.005 - 1.030   pH 5.0 5.0 - 8.0   Glucose, UA >=500 (A) NEGATIVE mg/dL   Hgb urine dipstick SMALL (A) NEGATIVE   Bilirubin Urine NEGATIVE NEGATIVE   Ketones, ur NEGATIVE NEGATIVE mg/dL   Protein, ur 30 (A) NEGATIVE mg/dL   Nitrite NEGATIVE NEGATIVE   Leukocytes,Ua NEGATIVE NEGATIVE   RBC / HPF 21-50 0 - 5 RBC/hpf   WBC, UA 0-5 0 - 5 WBC/hpf   Bacteria, UA NONE SEEN NONE SEEN   Squamous Epithelial / HPF 0-5 0 - 5 /HPF   Mucus PRESENT     Comment: Performed at [facility], [place] [address] [place], [place] [address]  Urine rapid drug screen (hosp performed)     Status: Abnormal   Collection Time: 05/04/22  1:35 AM  Result Value Ref Range   Opiates NONE DETECTED NONE DETECTED   Cocaine NONE DETECTED NONE DETECTED   Benzodiazepines NONE DETECTED NONE DETECTED   Amphetamines NONE DETECTED NONE DETECTED   Tetrahydrocannabinol POSITIVE (A) NONE DETECTED   Barbiturates NONE DETECTED NONE DETECTED    Comment: (NOTE) DRUG SCREEN FOR MEDICAL PURPOSES ONLY.  IF CONFIRMATION IS NEEDED FOR ANY PURPOSE, NOTIFY LAB WITHIN 5 DAYS.  LOWEST DETECTABLE LIMITS FOR URINE DRUG SCREEN Drug Class                     Cutoff (ng/mL) Amphetamine and metabolites    1000 Barbiturate and metabolites    200 Benzodiazepine                 200 Opiates and metabolites        300 Cocaine and metabolites        300 THC                            50 Performed at [facility], [place] [address] [place], [place] [address]   I-stat chem 8, ED     Status: Abnormal   Collection Time: 05/04/22  2:42 AM  Result Value Ref Range   Sodium 140 135 - 145 mmol/L   Potassium 3.7 3.5 - 5.1 mmol/L   Chloride 103 98 - 111 mmol/L   BUN 13 6 - 20 mg/dL   Creatinine, Ser 1.00 0.61  - 1.24 mg/dL   Glucose, Bld 131 (H) 70 - 99 mg/dL    Comment: Glucose reference range applies only to samples taken after fasting for at least 8 hours.   Calcium, Ion 1.04 (L) 1.15 - 1.40 mmol/L   TCO2 23 22 - 32 mmol/L   Hemoglobin 15.0 13.0 - 17.0 g/dL   HCT 44.0 39.0 - 52.0 %  Troponin I (High Sensitivity)     Status: None   Collection Time: 05/04/22  4:12 AM  Result Value Ref Range   Troponin I (High Sensitivity) 4 <18 ng/L    Comment: (NOTE) Elevated high sensitivity troponin I (hsTnI) values and significant  changes across serial measurements may suggest ACS but many other  chronic and acute conditions are known to elevate hsTnI results.  Refer to the "Links" section for chest pain algorithms and additional  guidance. Performed at [facility], [place] [address] [place], [place] [address]   Hemoglobin A1c     Status: Abnormal   Collection Time: 05/04/22  6:21 AM  Result Value Ref Range   Hgb A1c MFr Bld 6.0 (H) 4.8 - 5.6 %    Comment: (NOTE) Pre diabetes:          5.7%-6.4%  Diabetes:              >6.4%  Glycemic control for   <7.0% adults with diabetes    Mean Plasma Glucose 125.5 mg/dL    Comment: Performed at [facility] [address], [place], [place] [address]  HIV Antibody (routine testing w rflx)     Status: None   Collection Time: 05/04/22  6:21 AM  Result Value Ref Range   HIV Screen 4th Generation wRfx Non Reactive Non Reactive    Comment: Performed at [facility], [place] [address] [place], [place] [address]  Lipid panel     Status: None   Collection Time: 05/04/22  6:21 AM  Result Value Ref Range   Cholesterol 129 0 - 200 mg/dL   Triglycerides 91 <150 mg/dL   HDL 49 >40 mg/dL   Total CHOL/HDL Ratio 2.6 RATIO   VLDL 18 0 - 40 mg/dL   LDL Cholesterol 62 0 - 99 mg/dL    Comment:        Total Cholesterol/HDL:CHD Risk Coronary Heart Disease Risk Table                     Men   Women  1/2 Average Risk   3.4   3.3  Average Risk        5.0   4.4  2 X Average Risk   9.6   7.1  3 X Average Risk  23.4   11.0        Use the calculated Patient Ratio above and the CHD Risk Table to determine the patient's CHD Risk.        ATP III CLASSIFICATION (LDL):  <100     mg/dL   Optimal  100-129  mg/dL   Near or Above                    Optimal  130-159  mg/dL   Borderline  160-189  mg/dL   High  >190     mg/dL   Very High Performed at [facility] [address] [place], [place] [address]    ECHOCARDIOGRAM COMPLETE BUBBLE STUDY  Result Date: 05/04/2022    ECHOCARDIOGRAM REPORT   Patient Name:   [name] Date of Exam: 05/04/2022 Medical Rec #:  [id number]         Height:       73.0 in Accession #:    [id number]        Weight:       238.0 lb Date of Birth:  1960-07-10          BSA:          2.317 m Patient Age:    75 years          BP:           117/105 mmHg Patient Gender: M                 HR:           55 bpm. Exam Location:  Inpatient Procedure: 2D Echo, Cardiac Doppler, Color Doppler, Saline Contrast Bubble Study            and Intracardiac Opacification Agent Indications:    stroke  History:        Patient has prior history of Echocardiogram examinations.                 Cardiomyopathy, CAD and Pericardial Disease, Defibrillator and                 Prior CABG, Stroke, Arrythmias:Bradycardia; Risk                 Factors:Dyslipidemia.  Sonographer:    Phineas Douglas Referring Phys: [id number] [place]  1. Left ventricular ejection fraction, by estimation, is 35 to 40%. The left ventricle has moderately decreased function. The left ventricle demonstrates regional wall motion abnormalities (see scoring diagram/findings for description). Inferior/inferolateral akinesis. There is mild left ventricular hypertrophy. Left ventricular diastolic parameters are consistent with Grade II diastolic dysfunction (pseudonormalization). Elevated left atrial pressure.  2. Right ventricular systolic function was not well visualized.  The right ventricular size is not well visualized.  3. The mitral valve is normal in structure. No evidence of mitral valve regurgitation. No evidence of mitral stenosis.  4. The aortic valve is tricuspid. Aortic valve regurgitation is not visualized. No aortic stenosis is present.  5. Agitated saline contrast bubble study was positive with shunting observed within 3-6 cardiac cycles suggestive of interatrial shunt. Poor quality images on bubble study, but significant shunting seen FINDINGS  Left Ventricle: Left ventricular ejection fraction, by estimation, is 35 to 40%. The left ventricle has moderately decreased function. The left ventricle demonstrates regional wall motion abnormalities. Definity contrast agent was given IV to delineate the left ventricular endocardial borders. The left ventricular internal cavity size was normal in size. There is mild left ventricular hypertrophy. Left ventricular diastolic parameters are consistent with Grade II diastolic dysfunction (pseudonormalization). Elevated left atrial pressure.  LV Wall Scoring: The entire inferior wall and posterior wall are akinetic. The inferior septum is hypokinetic. The entire anterior wall, antero-lateral wall, anterior septum, apical lateral segment, and apex are normal. Right Ventricle: The right ventricular size is not well visualized. Right vetricular wall thickness was not well visualized. Right ventricular systolic function was not well visualized. Left Atrium: Left atrial size was normal in size. Right Atrium: Right atrial size was normal in size. Pericardium: There is no evidence of pericardial effusion. Mitral Valve: The mitral valve is normal in structure. No evidence of mitral valve regurgitation. No evidence of mitral valve stenosis. Tricuspid Valve: The tricuspid valve is normal in structure. Tricuspid valve regurgitation is trivial. Aortic Valve: The aortic valve is tricuspid. Aortic valve regurgitation is not visualized. No aortic  stenosis is present. Pulmonic Valve: The pulmonic valve was grossly normal. Pulmonic valve regurgitation is trivial. Aorta: The aortic root and ascending aorta are structurally normal, with no evidence of dilitation. IAS/Shunts: The interatrial septum was not well visualized. Agitated saline contrast was given intravenously to evaluate for intracardiac shunting. Agitated saline contrast bubble study was positive with shunting observed within 3-6 cardiac cycles suggestive of interatrial shunt.  LEFT VENTRICLE PLAX 2D LVIDd:         5.30 cm      Diastology LVIDs:         4.60 cm      LV e' medial:    3.23 cm/s LV PW:  1.20 cm      LV E/e' medial:  20.8 LV IVS:        1.00 cm      LV e' lateral:   8.93 cm/s LVOT diam:     2.20 cm      LV E/e' lateral: 7.5 LV SV:         67 LV SV Index:   29 LVOT Area:     3.80 cm  LV Volumes (MOD) LV vol d, MOD A2C: 217.0 ml LV vol d, MOD A4C: 172.0 ml LV vol s, MOD A2C: 127.0 ml LV vol s, MOD A4C: 101.0 ml LV SV MOD A2C:     90.0 ml LV SV MOD A4C:     172.0 ml LV SV MOD BP:      78.7 ml RIGHT VENTRICLE RV Basal diam:  3.50 cm RV S prime:     5.78 cm/s TAPSE (M-mode): 0.9 cm LEFT ATRIUM             Index        RIGHT ATRIUM           Index LA diam:        4.10 cm 1.77 cm/m   RA Area:     20.60 cm LA Vol (A2C):   68.7 ml 29.65 ml/m  RA Volume:   53.80 ml  23.22 ml/m LA Vol (A4C):   53.0 ml 22.88 ml/m LA Biplane Vol: 67.7 ml 29.22 ml/m  AORTIC VALVE LVOT Vmax:   70.50 cm/s LVOT Vmean:  49.300 cm/s LVOT VTI:    0.177 m  AORTA Ao Root diam: 3.80 cm Ao Asc diam:  3.20 cm MITRAL VALVE MV Area (PHT): 3.77 cm    SHUNTS MV Decel Time: 201 msec    Systemic VTI:  0.18 m MV E velocity: 67.30 cm/s  Systemic Diam: 2.20 cm MV A velocity: 75.40 cm/s MV E/A ratio:  0.89 Oswaldo Milian MD Electronically signed by Oswaldo Milian MD Signature Date/Time: 05/04/2022/12:05:17 PM    Final    CT ANGIO HEAD NECK W WO CM  Result Date: 05/04/2022 CLINICAL DATA:  Episode of near  syncope and dizziness EXAM: CT ANGIOGRAPHY HEAD AND NECK TECHNIQUE: Multidetector CT imaging of the head and neck was performed using the standard protocol during bolus administration of intravenous contrast. Multiplanar CT image reconstructions and MIPs were obtained to evaluate the vascular anatomy. Carotid stenosis measurements (when applicable) are obtained utilizing NASCET criteria, using the distal internal carotid diameter as the denominator. RADIATION DOSE REDUCTION: This exam was performed according to the departmental dose-optimization program which includes automated exposure control, adjustment of the mA and/or kV according to patient size and/or use of iterative reconstruction technique. CONTRAST:  21m OMNIPAQUE IOHEXOL 350 MG/ML SOLN COMPARISON:  03/29/2018 CTA head and neck FINDINGS: CT HEAD FINDINGS Brain: No evidence of acute infarct, hemorrhage, mass, mass effect, or midline shift. No hydrocephalus or extra-axial fluid collection. Redemonstrated encephalomalacia in the bilateral cerebral and cerebellar hemispheres. Vascular: No hyperdense vessel. Skull: Normal. Negative for fracture or focal lesion. Sinuses/Orbits: No acute finding. Other: The mastoid air cells are well aerated. CTA NECK FINDINGS Aortic arch: Two-vessel arch with a common origin of the brachiocephalic and left common carotid arteries. Imaged portion shows no evidence of aneurysm or dissection. No significant stenosis of the major arch vessel origins. Aortic atherosclerosis Right carotid system: No evidence of dissection, occlusion, or hemodynamically significant stenosis (greater than 50%). Left carotid system: No evidence of dissection, occlusion, or  hemodynamically significant stenosis (greater than 50%). Vertebral arteries: No evidence of dissection, occlusion, or hemodynamically significant stenosis (greater than 50%). Skeleton: No acute osseous abnormality. Edentulous. Status post median sternotomy. Degenerative changes in  the cervical spine. Other neck: Negative. Upper chest: No focal pulmonary opacity or pleural effusion. Centrilobular emphysema. Review of the MIP images confirms the above findings CTA HEAD FINDINGS Anterior circulation: Both internal carotid arteries are patent to the termini, without significant stenosis. A1 segments patent. 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 Anterior cerebral arteries are patent to their distal aspects. No M1 stenosis or occlusion. MCA branches perfused and symmetric. Posterior circulation: Vertebral arteries patent to the vertebrobasilar junction without stenosis. Posterior inferior cerebellar arteries patent proximally. Basilar patent to its distal aspect. Superior cerebellar arteries patent proximally. Patent left P1. Hypoplastic or aplastic right P1. Fetal origin the right PCA from the right posterior communicating artery. The left posterior communicating artery is also patent. PCAs perfused to their distal aspects without stenosis. Venous sinuses: As permitted by contrast timing, patent. Anatomic variants: Fetal origin the right PCA. Review of the MIP images confirms the above findings IMPRESSION: 1. No acute intracranial process. 2. No intracranial large vessel occlusion or significant stenosis. 3. No hemodynamically significant stenosis in the neck. 4. Aortic atherosclerosis. 5. Emphysema. Aortic Atherosclerosis (ICD10-I70.0) and Emphysema (ICD10-J43.9). Electronically Signed   By: Merilyn Baba M.D.   On: 05/04/2022 03:23   DG Chest 1 View  Result Date: 05/04/2022 CLINICAL DATA:  Shortness of breath and dizziness EXAM: PORTABLE CHEST 1 VIEW COMPARISON:  11/30/2020 FINDINGS: Cardiac shadow is stable. Postsurgical changes are noted. Defibrillator is again seen. Lungs are well aerated bilaterally without focal infiltrate. No bony abnormality is noted. IMPRESSION: No active disease. Electronically Signed   By: Inez Catalina M.D.   On: 05/04/2022 01:53    Pending Labs Unresulted Labs (From admission, onward)     Start     Ordered   05/05/22 6378  Basic metabolic panel  Tomorrow morning,   R        05/04/22 0518   05/05/22 0500  CBC  Tomorrow morning,   R        05/04/22 0518            Vitals/Pain Today's Vitals   05/04/22 0510 05/04/22 0630 05/04/22 1136 05/04/22 1140  BP:  (!) 117/105  100/70  Pulse:  62  66  Resp:  19  (!) 26  Temp: 97.7 F (36.5 C)  98.1 F (36.7 C)   TempSrc: Oral  Oral   SpO2:  97%  97%  Weight:      Height:      PainSc:        Isolation Precautions No active isolations  Medications Medications  aspirin EC tablet 81 mg (81 mg Oral Given 05/04/22 1226)  traMADol (ULTRAM) tablet 50 mg (has no administration in time range)  atorvastatin (LIPITOR) tablet 80 mg (80 mg Oral Given 05/04/22 1226)  carvedilol (COREG) tablet 25 mg (25 mg Oral Given 05/04/22 1227)  sacubitril-valsartan (ENTRESTO) 97-103 mg per tablet (1 tablet Oral Given 05/04/22 1227)  furosemide (LASIX) tablet 20 mg (20 mg Oral Given 05/04/22 1227)  nitroGLYCERIN (NITROSTAT) SL tablet 0.4 mg (has no administration in time range)  spironolactone (ALDACTONE) tablet 25 mg (25 mg Oral Given 05/04/22 1226)  dapagliflozin propanediol (FARXIGA) tablet 10 mg (10 mg Oral Given 05/04/22 1226)  gabapentin (NEURONTIN) capsule 300 mg (has  no administration in time range)  methocarbamol (ROBAXIN) tablet 500 mg (has no administration in time range)  cholecalciferol (VITAMIN D3) 25 MCG (1000 UNIT) tablet 5,000 Units (5,000 Units Oral Given 05/04/22 1226)  multivitamin with minerals tablet 1 tablet (1 tablet Oral Given 05/04/22 1227)  loratadine (CLARITIN) tablet 10 mg (10 mg Oral Given 05/04/22 1227)  fluticasone (FLONASE) 50 MCG/ACT nasal spray 1 spray (1 spray Each Nare Not Given 05/04/22 1231)  brimonidine (ALPHAGAN) 0.15 % ophthalmic solution 1 drop (1 drop Both Eyes Not Given 05/04/22 1231)  latanoprost (XALATAN) 0.005  % ophthalmic solution 1 drop (has no administration in time range)  enoxaparin (LOVENOX) injection 40 mg (40 mg Subcutaneous Given 05/04/22 1231)  acetaminophen (TYLENOL) tablet 650 mg (has no administration in time range)    Or  acetaminophen (TYLENOL) suppository 650 mg (has no administration in time range)  traZODone (DESYREL) tablet 25 mg (has no administration in time range)  magnesium hydroxide (MILK OF MAGNESIA) suspension 30 mL (has no administration in time range)  ondansetron (ZOFRAN) tablet 4 mg (has no administration in time range)    Or  ondansetron (ZOFRAN) injection 4 mg (has no administration in time range)  perflutren lipid microspheres (DEFINITY) IV suspension (3 mLs Intravenous Given 05/04/22 1129)  ticagrelor (BRILINTA) tablet 90 mg (has no administration in time range)  ondansetron (ZOFRAN) injection 4 mg (4 mg Intravenous Given 05/04/22 0242)  iohexol (OMNIPAQUE) 350 MG/ML injection 75 mL (75 mLs Intravenous Contrast Given 05/04/22 0311)  sodium chloride 0.9 % bolus 1,000 mL (0 mLs Intravenous Stopped 05/04/22 0612)   stroke: early stages of recovery book ( Does not apply Given 05/04/22 1230)    Mobility walks     Focused Assessments Cardiac Assessment Handoff:    Lab Results  Component Value Date   CKTOTAL 436 (H) 01/29/2013   CKMB 28.0 ([place]) 01/29/2013   TROPONINI <0.03 03/29/2018   Lab Results  Component Value Date   DDIMER 3.06 (H) 01/28/2013   Does the Patient currently have chest pain? No   , Neuro Assessment Handoff:  Swallow screen pass? Yes    NIH Stroke Scale  Dizziness Present: No Headache Present: No Interval: Other (Comment) Level of Consciousness (1a.)   : Alert, keenly responsive LOC Questions (1b. )   : Answers both questions correctly LOC Commands (1c. )   : Performs both tasks correctly Best Gaze (2. )  : Normal Visual (3. )  : No visual loss Facial Palsy (4. )    : Normal symmetrical movements Motor Arm, Left (5a. )   : No  drift Motor Arm, Right (5b. ) : No drift Motor Leg, Left (6a. )  : No drift Motor Leg, Right (6b. ) : No drift Limb Ataxia (7. ): Absent Sensory (8. )  : Normal, no sensory loss Best Language (9. )  : No aphasia Dysarthria (10. ): Normal Extinction/Inattention (11.)   : No Abnormality Complete NIHSS TOTAL: 0     Neuro Assessment: Within Defined Limits Neuro Checks:   Other (Comment) (05/04/22 1030)  Has TPA been given? No If patient is a Neuro Trauma and patient is going to OR before floor call report to [place] nurse: [phone] or [phone]   R Recommendations: See Admitting Provider Note  Report given to:   Additional Notes: Pt could possibly be discharged but we are still waiting on the MRI... so dr said maybe later today or tomorrow if they can get that done. We are currently waiting  on a rep to get here to adjust his defibrillator so they can complete the MRI.

## 2022-05-04 NOTE — ED Triage Notes (Addendum)
Pt states he had episode of near syncope with dizziness, shortness of breath, ringing in left ear, and nausea this evening. Significant cardiac hx. Denies chest pain. States the shob is worse with exertion. 12 lead unremarkable with ems.   BP: 97/55, HR 100

## 2022-05-04 NOTE — Consult Note (Signed)
NEUROLOGY CONSULTATION NOTE   Date of service: May 04, 2022 Patient Name: [name] MRN:  [id number] DOB:  [birth year] Reason for consult: "episode of nausea, dizziness, incoordination on the right and unable to operate phone, this lasted several minutes" Requesting Provider: Maudie Flakes, MD _ _ _   _ __   _ __ _ _  __ __   _ __   __ _  History of Present Illness  [name] is a 62 y.o. male with PMH significant for prior strokes in BL cerebellum and cerebral hemispheres, current everyday smoker who smokes about a pack a day, CAD, HLD who was sitting on the couch and playing games on his phone when he had sudden onset nausea with sensation of disequilibrium and his head spinning, he is right-hand was very incoordinated and he was unable to slide a button on his phone with his right hand.  This lasted about 10 minutes and then resolved.  He reports prior history of stroke with residual mild left-sided weakness.  He denies any history of diabetes, hypertension or hyperlipidemia.  No prior history of atrial fibrillation.  He smokes 1 pack a day, occasionally will smoke marijuana.  Denies any other recreational substances.  LKW: 20-30 on 05/03/2022. mRS: 1 tNKASE: Not offered due to resolution of symptoms. Thrombectomy: Not offered due to resolution of symptoms. NIHSS components Score: Comment  1a Level of Conscious 0'[x]'$  1'[]'$  2'[]'$  3'[]'$      1b LOC Questions 0'[x]'$  1'[]'$  2'[]'$       1c LOC Commands 0'[x]'$  1'[]'$  2'[]'$       2 Best Gaze 0'[x]'$  1'[]'$  2'[]'$       3 Visual 0'[x]'$  1'[]'$  2'[]'$  3'[]'$      4 Facial Palsy 0'[x]'$  1'[]'$  2'[]'$  3'[]'$      5a Motor Arm - left 0'[x]'$  1'[]'$  2'[]'$  3'[]'$  4'[]'$  UN'[]'$    5b Motor Arm - Right 0'[x]'$  1'[]'$  2'[]'$  3'[]'$  4'[]'$  UN'[]'$    6a Motor Leg - Left 0'[x]'$  1'[]'$  2'[]'$  3'[]'$  4'[]'$  UN'[]'$    6b Motor Leg - Right 0'[x]'$  1'[]'$  2'[]'$  3'[]'$  4'[]'$  UN'[]'$    7 Limb Ataxia 0'[x]'$  1'[]'$  2'[]'$  3'[]'$  UN'[]'$     8 Sensory 0'[x]'$  1'[]'$  2'[]'$  UN'[]'$      9 Best Language 0'[x]'$  1'[]'$  2'[]'$  3'[]'$      10 Dysarthria 0'[x]'$  1'[]'$  2'[]'$  UN'[]'$      11 Extinct. and Inattention 0'[x]'$  1'[]'$   2'[]'$       TOTAL: 0       ROS   Constitutional Denies weight loss, fever and chills.   HEENT Denies changes in vision and hearing.   Respiratory Denies SOB and cough.   CV Denies palpitations and CP   GI Denies abdominal pain, nausea, vomiting and diarrhea.   GU Denies dysuria and urinary frequency.   MSK Denies myalgia and joint pain.   Skin Denies rash and pruritus.   Neurological Denies headache and syncope.   Psychiatric Denies recent changes in mood. Denies anxiety and depression.    Past History   Past Medical History:  Diagnosis Date   Abnormal EKG    hx of ischemia showing up on ekg's   Acute myopericarditis    a. 03/2013: readm with hypoxia, tachycardia, elevated ESR/CRP, elevated troponin with Dressler syndrome and myopericarditis; treated with steroids.   Acute respiratory failure ([place])    a. 01/2013: VDRF. b. 03/2013: hypoxia requiring supp O2 during adm, resolved by discharge.   C. difficile colitis    a. 01/2013 during prolonged adm. b. Recurred 03/2013.   Cardiac  tamponade    a. 01/2013 s/p drain.   Cataracts, bilateral    Coronary artery disease    a. s/p MI in 2009 in [place] with stenting of the LCX and LAD;  b. 12/2012 NSTEMI/CAD: LM nl, LAD patent prox stent, LCX 50-70 isr (FFR 0.84), RCA dom, 135m EF 40-45%-->Med Rx. c. 01/2013: anterolateral STEMI complicated by pericardial effusion (presumed purulent pericarditis) and tamponade s/p drain, ruptured LV pseudoaneurysm s/p CorMatrix patch, CABGx1 (SVG-OM1), fever, CVA, VDRF, C diff.   CVA (cerebral infarction)    a. 01/2013 in setting of prolonged hospitalization - residual L arm weakness.   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 03/2013: readm with hypoxia, tachycardia, elevated ESR/CRP, elevated troponin with Dressler syndrome and myopericarditis; treated with steroids.   Glaucoma    Hyperlipidemia    Hypokalemia    Hyponatremia    a. During late 2014.   Ischemic cardiomyopathy    a. Sept/Oct 2014: EF ~40% (ICM). b.  03/2013: EF 25-30%. Off ACEI due to hypotension (MIXED NICM/ICM).   Optic neuropathy, ischemic    Pericardial effusion    a. 01/2013: pericardial effusion (presumed purulent pericarditis) and cardiac tamponade s/p drain. b. Persistent moderate pericardial effusion 03/2013.   Pre-diabetes    Pseudoaneurysm of left ventricle of heart    a. 01/2013:  Ruptured inferoposterior LV pseudoaneurysm s/p CorMatrix patch.   Sinus bradycardia    asymptomatic   Stroke ([facility] 2009   weak lt side-lt arm   Tobacco abuse    Wears dentures    top   Past Surgical History:  Procedure Laterality Date   CARDIAC CATHETERIZATION  01/06/2013   CATARACT EXTRACTION Bilateral 05/2021   COLONOSCOPY WITH PROPOFOL N/A 07/03/2016   Procedure: COLONOSCOPY WITH PROPOFOL;  Surgeon: SManus Gunning MD;  [facility];  Service: Gastroenterology;  Laterality: N/A;   CORONARY ANGIOPLASTY WITH STENT PLACEMENT  2000's X 2   "1 + 1" (01/06/2013)   CORONARY ARTERY BYPASS GRAFT N/A 01/28/2013   Procedure: CORONARY ARTERY BYPASS GRAFTING (CABG);  Surgeon: SMelrose Nakayama MD;  [place]  Service: Open Heart Surgery;  Laterality: N/A;  CABG x one, using left greater saphenous vein harvested endoscopically   ICD IMPLANT N/A 05/25/2020   Procedure: ICD IMPLANT;  Surgeon: AThompson Grayer MD;  [facility];  Service: Cardiovascular;  Laterality: N/A;   INTRAOPERATIVE TRANSESOPHAGEAL ECHOCARDIOGRAM N/A 01/28/2013   Procedure: INTRAOPERATIVE TRANSESOPHAGEAL ECHOCARDIOGRAM;  Surgeon: SMelrose Nakayama MD;  [place] [facility]  Service: Open Heart Surgery;  Laterality: N/A;   LEFT HEART CATH AND CORS/GRAFTS ANGIOGRAPHY N/A 09/13/2020   Procedure: LEFT HEART CATH AND CORS/GRAFTS ANGIOGRAPHY;  Surgeon: JMartinique Peter M, MD;  [facility];  Service: Cardiovascular;  Laterality: N/A;   LEFT HEART CATHETERIZATION WITH CORONARY ANGIOGRAM N/A 01/06/2013   Procedure: LEFT HEART  CATHETERIZATION WITH CORONARY ANGIOGRAM;  Surgeon: Peter M JMartinique MD;  [facility];  Service: Cardiovascular;  Laterality: N/A;   LEFT HEART CATHETERIZATION WITH CORONARY ANGIOGRAM N/A 01/21/2013   Procedure: LEFT HEART CATHETERIZATION WITH CORONARY ANGIOGRAM;  Surgeon: CBurnell Blanks MD;  [facility];  Service: Cardiovascular;  Laterality: N/A;   LUMBAR LAMINECTOMY/ DECOMPRESSION WITH MET-RX Right 06/30/2018   Procedure: Right minimally invasive Lumbar Three-Four Far lateral discectomy;  Surgeon: OJudith Part MD;  [place]  Service: Neurosurgery;  Laterality: Right;  Right minimally invasive Lumbar Three-Four Far lateral discectomy   MASS EXCISION Right 07/21/2014   Procedure:  EXCISION RIGHT NECK MASS;  Surgeon: Erroll Luna, MD;  Location: [place];  Service: General;  Laterality: Right;   PERICARDIAL TAP N/A 01/24/2013   Procedure: PERICARDIAL TAP;  Surgeon: Blane Ohara, MD;  [facility];  Service: Cardiovascular;  Laterality: N/A;   RIGHT HEART CATHETERIZATION Right 01/24/2013   Procedure: RIGHT HEART CATH;  Surgeon: Blane Ohara, MD;  Location: [facility] LAB;  Service: Cardiovascular;  Laterality: Right;   VENTRICULAR ANEURYSM RESECTION N/A 01/28/2013   Procedure: LEFT VENTRICULAR ANEURYSM REPAIR;  Surgeon: Melrose Nakayama, MD;  Location: [place];  Service: Open Heart Surgery;  Laterality: N/A;   Family History  Problem Relation Age of Onset   Heart attack Mother    Diabetes Mother    Hypertension Mother    Hypertension Father    CAD Other    HIV Brother    Stroke Neg Hx    Social History   Socioeconomic History   Marital status: Single    Spouse name: Not on file   Number of children: 2   Years of education: 12th   Highest education level: Not on file  Occupational History   Occupation: N/A  Tobacco Use   Smoking status: Every Day    Packs/day: 1.00    Years: 35.00    Total pack years:  35.00    Types: Cigarettes   Smokeless tobacco: Never  Vaping Use   Vaping Use: Never used  Substance and Sexual Activity   Alcohol use: No    Alcohol/week: 0.0 standard drinks of alcohol   Drug use: No   Sexual activity: Yes  Other Topics Concern   Not on file  Social History Narrative   Lives in [place] with wife.  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  He is now retired on disability.  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      Caffeine Use: very rarely   Social Determinants of Health   Financial Resource Strain: Not on file  Food Insecurity: Not on file  Transportation Needs: Not on file  Physical Activity: Not on file  Stress: Not on file  Social Connections: Not on file   No Known Allergies  Medications  (Not in a hospital admission)    Vitals   Vitals:   05/04/22 0119 05/04/22 0210 05/04/22 0245 05/04/22 0330  BP: 104/84 102/71 101/75 92/62  Pulse: 61 63 62 63  Resp: '15 20 15 16  '$ Temp: 98.6 F (37 C)     SpO2: 95% 98% 95% 92%  Weight:      Height:         Body mass index is 31.4 kg/m.  Physical Exam   General: Laying comfortably in bed; in no acute distress.  HENT: Normal oropharynx and mucosa. Normal external appearance of ears and nose.  Neck: Supple, no pain or tenderness  CV: No JVD. No peripheral edema.  Pulmonary: Symmetric Chest rise. Normal respiratory effort.  Abdomen: Soft to touch, non-tender.  Ext: No cyanosis, edema, or deformity  Skin: No rash. Normal palpation of skin.   Musculoskeletal: Normal digits and nails by inspection. No clubbing.   Neurologic Examination  Mental status/Cognition: Alert, oriented to self, place, month but think this is 2026, good attention.  Speech/language: Fluent, comprehension intact, object naming intact, repetition intact.  Cranial nerves:   CN II Pupils equal and reactive to light, no VF deficits    CN III,IV,VI EOM intact, no  gaze preference or deviation, no nystagmus    CN V  normal sensation in V1, V2, and V3 segments bilaterally    CN VII no asymmetry, no nasolabial fold flattening    CN VIII normal hearing to speech    CN IX & X normal palatal elevation, no uvular deviation    CN XI 5/5 head turn and 5/5 shoulder shrug bilaterally    CN XII midline tongue protrusion    Motor:  Muscle bulk: Normal, tone normal, pronator drift noted in left upper extremity.  Tremor none Mvmt Root Nerve  Muscle Right Left Comments  SA C5/6 Ax Deltoid 5 5   EF C5/6 Mc Biceps 5 5   EE C6/7/8 Rad Triceps 5 5   WF C6/7 Med FCR     WE C7/8 PIN ECU     F Ab C8/T1 U ADM/FDI 5 5   HF L1/2/3 Fem Illopsoas 5 4+   KE L2/3/4 Fem Quad 5 5   DF L4/5 D Peron Tib Ant 5 5   PF S1/2 Tibial Grc/Sol 5 5    Sensation:  Light touch Intact throughout   Pin prick    Temperature    Vibration   Proprioception    Coordination/Complex Motor:  - Finger to Nose intact bilaterally - Heel to shin intact bilaterally - Rapid alternating movement are slowed in left upper extremity - Gait: Deferred Labs   CBC:  Recent Labs  Lab 05/04/22 0130 05/04/22 0242  WBC 6.0  --   NEUTROABS 3.2  --   HGB 15.5 15.0  HCT 44.2 44.0  MCV 85.8  --   PLT 123*  --     Basic Metabolic Panel:  Lab Results  Component Value Date   NA 140 05/04/2022   K 3.7 05/04/2022   CO2 25 05/04/2022   GLUCOSE 131 (H) 05/04/2022   BUN 13 05/04/2022   CREATININE 1.00 05/04/2022   CALCIUM 9.5 05/04/2022   GFRNONAA >60 05/04/2022   GFRAA 92 05/06/2020   Lipid Panel:  Lab Results  Component Value Date   LDLCALC 72 05/25/2021   HgbA1c:  Lab Results  Component Value Date   HGBA1C 6.2 (H) 02/29/2020   Urine Drug Screen:     Component Value Date/Time   LABOPIA NONE DETECTED 05/04/2022 0135   COCAINSCRNUR NONE DETECTED 05/04/2022 0135   LABBENZ NONE DETECTED 05/04/2022 0135   AMPHETMU NONE DETECTED 05/04/2022 0135   THCU POSITIVE (A) 05/04/2022 0135   LABBARB NONE DETECTED 05/04/2022 0135    Alcohol  Level No results found for: "ETH"  CT Head without contrast(Personally reviewed): CTH was negative for a large hypodensity concerning for a large territory infarct or hyperdensity concerning for an ICH  CT angio Head and Neck with contrast(Personally reviewed): No LVO  MRI Brain(Personally reviewed): pending  Impression   [name] is a 62 y.o. male with PMH significant for with PMH significant for prior strokes in BL cerebellum and cerebral hemispheres, current everyday smoker who smokes about a pack a day, CAD, HLD who was sitting on the couch and playing games on his phone when he had sudden onset nausea with sensation of disequilibrium and his head spinning, he is right-hand was very incoordinated and he was unable to slide a button on his phone with his right hand.  This lasted about 10 minutes and then resolved.  Episode is concerning for a TIA/minor ischemic stroke.   Recommendations   - Frequent Neuro checks per stroke unit protocol -  Recommend brain imaging with MRI Brain without contrast - Recommend obtaining TTE  - Recommend obtaining Lipid panel with LDL - Please start statin if LDL > 70 - Recommend HbA1c to evaluate for diabetes and how well it is controlled. - Antithrombotic -aspirin 81 mg daily along with Plavix 75 mg daily for 21 days, followed by aspirin 81 mg daily alone. - Recommend DVT ppx - SBP goal - permissive hypertension first 24 h < 220/110. Held home meds.  - Recommend Telemetry monitoring for arrythmia - Recommend bedside swallow screen prior to PO intake. - Stroke education booklet - Recommend PT/OT/SLP consult - stroke team to follow -Counseled him on the importance of quitting smoking to reduce his risk of stroke in the future.  I discussed treatment options with patient including nicotine patches, gum along with outpatient options including Wellbutrin and Chantix.  He is going to think about this and discuss with his  PCP.  ______________________________________________________________________  Plan discussed with patient and with Dr. Sedonia Small with the ED team.  Thank you for the opportunity to take part in the care of this patient. If you have any further questions, please contact the neurology consultation attending.  Signed,  [place] Pager Number [phone] _ _ _   _ __   _ __ _ _  __ __   _ __   __ _

## 2022-05-04 NOTE — ED Notes (Signed)
Pt transported to MRI.

## 2022-05-04 NOTE — ED Notes (Signed)
The patient asked this EMT for some antibiotic cream for a sore on the bottom of his foot. The RN was made aware of the situation.

## 2022-05-05 ENCOUNTER — Other Ambulatory Visit: Payer: Self-pay | Admitting: Cardiology

## 2022-05-05 DIAGNOSIS — I639 Cerebral infarction, unspecified: Secondary | ICD-10-CM

## 2022-05-07 ENCOUNTER — Ambulatory Visit (INDEPENDENT_AMBULATORY_CARE_PROVIDER_SITE_OTHER): Payer: Medicare HMO | Admitting: Podiatry

## 2022-05-07 ENCOUNTER — Ambulatory Visit (INDEPENDENT_AMBULATORY_CARE_PROVIDER_SITE_OTHER): Payer: Medicare HMO

## 2022-05-07 ENCOUNTER — Other Ambulatory Visit: Payer: Self-pay | Admitting: Cardiology

## 2022-05-07 ENCOUNTER — Ambulatory Visit: Payer: Medicare HMO | Admitting: Podiatry

## 2022-05-07 DIAGNOSIS — I440 Atrioventricular block, first degree: Secondary | ICD-10-CM

## 2022-05-07 DIAGNOSIS — L600 Ingrowing nail: Secondary | ICD-10-CM | POA: Diagnosis not present

## 2022-05-07 DIAGNOSIS — I5022 Chronic systolic (congestive) heart failure: Secondary | ICD-10-CM | POA: Diagnosis not present

## 2022-05-07 DIAGNOSIS — Z95810 Presence of automatic (implantable) cardiac defibrillator: Secondary | ICD-10-CM | POA: Diagnosis not present

## 2022-05-07 DIAGNOSIS — R55 Syncope and collapse: Secondary | ICD-10-CM

## 2022-05-07 DIAGNOSIS — I639 Cerebral infarction, unspecified: Secondary | ICD-10-CM

## 2022-05-07 NOTE — Progress Notes (Signed)
   Chief Complaint  Patient presents with   Ingrown Toenail    Right hallux ingrown nail check,     Subjective: 62 y.o. male presents today status post permanent nail avulsion procedure of the medial border right great toe that was performed on 04/23/2022.  Patient states that he feels significantly better.  He no longer has any pain or tenderness associated to the toe.   Past Medical History:  Diagnosis Date   Abnormal EKG    hx of ischemia showing up on ekg's   Acute myopericarditis    a. 03/2013: readm with hypoxia, tachycardia, elevated ESR/CRP, elevated troponin with Dressler syndrome and myopericarditis; treated with steroids.   Acute respiratory failure ([place])    a. 01/2013: VDRF. b. 03/2013: hypoxia requiring supp O2 during adm, resolved by discharge.   C. difficile colitis    a. 01/2013 during prolonged adm. b. Recurred 03/2013.   Cardiac tamponade    a. 01/2013 s/p drain.   Cataracts, bilateral    Coronary artery disease    a. s/p MI in 2009 in [place] with stenting of the LCX and LAD;  b. 12/2012 NSTEMI/CAD: LM nl, LAD patent prox stent, LCX 50-70 isr (FFR 0.84), RCA dom, 129m EF 40-45%-->Med Rx. c. 01/2013: anterolateral STEMI complicated by pericardial effusion (presumed purulent pericarditis) and tamponade s/p drain, ruptured LV pseudoaneurysm s/p CorMatrix patch, CABGx1 (SVG-OM1), fever, CVA, VDRF, C diff.   CVA (cerebral infarction)    a. 01/2013 in setting of prolonged hospitalization - residual L arm weakness.   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 03/2013: readm with hypoxia, tachycardia, elevated ESR/CRP, elevated troponin with Dressler syndrome and myopericarditis; treated with steroids.   Glaucoma    Hyperlipidemia    Hypokalemia    Hyponatremia    a. During late 2014.   Ischemic cardiomyopathy    a. Sept/Oct 2014: EF ~40% (ICM). b. 03/2013: EF 25-30%. Off ACEI due to hypotension (MIXED NICM/ICM).   Optic neuropathy, ischemic    Pericardial effusion    a. 01/2013:  pericardial effusion (presumed purulent pericarditis) and cardiac tamponade s/p drain. b. Persistent moderate pericardial effusion 03/2013.   Pre-diabetes    Pseudoaneurysm of left ventricle of heart    a. 01/2013:  Ruptured inferoposterior LV pseudoaneurysm s/p CorMatrix patch.   Sinus bradycardia    asymptomatic   Stroke ([facility] 2009   weak lt side-lt arm   Tobacco abuse    Wears dentures    top    Objective: Neurovascular status intact.  Skin is warm, dry and supple. Nail and respective nail fold appears to be healing appropriately.   Assessment: #1 s/p partial permanent nail matrixectomy medial border right great toe   Plan of care: #1 patient was evaluated  #2 light debridement of the periungual debris was performed to the border of the respective toe and nail plate using a tissue nipper. #3 patient is to return to clinic on a PRN basis.   BEdrick Kins DPM [facility]  Dr. BEdrick Kins DPM    [address] [address]               Office [phone] Fax [phone]

## 2022-05-08 ENCOUNTER — Encounter: Payer: Self-pay | Admitting: Internal Medicine

## 2022-05-08 ENCOUNTER — Ambulatory Visit: Payer: Medicare HMO | Attending: Internal Medicine | Admitting: Internal Medicine

## 2022-05-08 VITALS — BP 77/52 | HR 63 | Temp 97.8°F | Ht 73.0 in | Wt 234.0 lb

## 2022-05-08 DIAGNOSIS — Z8673 Personal history of transient ischemic attack (TIA), and cerebral infarction without residual deficits: Secondary | ICD-10-CM

## 2022-05-08 DIAGNOSIS — F17210 Nicotine dependence, cigarettes, uncomplicated: Secondary | ICD-10-CM

## 2022-05-08 DIAGNOSIS — D696 Thrombocytopenia, unspecified: Secondary | ICD-10-CM

## 2022-05-08 DIAGNOSIS — Z7689 Persons encountering health services in other specified circumstances: Secondary | ICD-10-CM

## 2022-05-08 DIAGNOSIS — I519 Heart disease, unspecified: Secondary | ICD-10-CM

## 2022-05-08 DIAGNOSIS — I25810 Atherosclerosis of coronary artery bypass graft(s) without angina pectoris: Secondary | ICD-10-CM | POA: Diagnosis not present

## 2022-05-08 DIAGNOSIS — F17200 Nicotine dependence, unspecified, uncomplicated: Secondary | ICD-10-CM

## 2022-05-08 DIAGNOSIS — Z122 Encounter for screening for malignant neoplasm of respiratory organs: Secondary | ICD-10-CM

## 2022-05-08 DIAGNOSIS — E669 Obesity, unspecified: Secondary | ICD-10-CM

## 2022-05-08 DIAGNOSIS — Z6830 Body mass index (BMI) 30.0-30.9, adult: Secondary | ICD-10-CM

## 2022-05-08 DIAGNOSIS — I9589 Other hypotension: Secondary | ICD-10-CM

## 2022-05-08 DIAGNOSIS — E66811 Obesity, class 1: Secondary | ICD-10-CM

## 2022-05-08 DIAGNOSIS — R7303 Prediabetes: Secondary | ICD-10-CM

## 2022-05-08 DIAGNOSIS — I10 Essential (primary) hypertension: Secondary | ICD-10-CM

## 2022-05-08 MED ORDER — NICOTINE 7 MG/24HR TD PT24: 7.0000 mg | MEDICATED_PATCH | Freq: Every day | TRANSDERMAL | 1 refills | Status: DC

## 2022-05-08 MED ORDER — SPIRONOLACTONE 25 MG PO TABS: 12.5000 mg | ORAL_TABLET | Freq: Every day | ORAL | 1 refills | Status: AC

## 2022-05-08 MED ORDER — CARVEDILOL 25 MG PO TABS: 12.5000 mg | ORAL_TABLET | Freq: Two times a day (BID) | ORAL | 3 refills | Status: DC

## 2022-05-08 NOTE — Progress Notes (Signed)
Patient ID: [name], male    DOB: [birth year]  MRN: [id number]  CC: Establish Care (Re-establishing care. [facility] lab work - recently had a mild stroke. /)   Subjective: [name] is a 62 y.o. 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    Patient presents to reestablish care.  He had left [place] and went to [facility].  He decided to come back here because he was not pleased with the care he was getting there. Patient hospitalized on 05/04/2022 for 1 day for acute CVA.  He presented with dysarthria and right hand discoordination and dizziness.  His workup was as indicated below: Acute CVA Patient underwent MRI which showed a small stroke. Patient was seen by neurology.  Patient started on aspirin and Brilinta to be taken for 4 weeks followed by aspirin and Plavix.  Echocardiogram shows EF to be 35 to 40%.  Seen by physical therapy.  No needs identified. LDL is 62.  Patient is already on a statin. HbA1c is 6.0.   Chronic systolic CHF/AICD in situ Followed by cardiology in the outpatient setting.  Last echocardiogram is from 2022 which showed EF to be 25-30 percent.  Current echocardiogram shows improvement in systolic function. Patient seems to be on medical therapy including carvedilol, Farxiga, furosemide, Entresto and spironolactone. Outpatient follow-up with cardiology.   Coronary artery disease Stable.  Continue medications as outlined above.  Today: Patient reports that his symptoms have resolved.  He was confused about the aspirin, Brilinta and Plavix.  He has been taking all 3.  The plan was to have him take the aspirin and Brilinta for 4 weeks then go back to taking aspirin and Plavix indefinitely.  Platelet count mildly  decreased at 123.  Denies any bruising or bleeding. HTN/CAD/CHF: Blood pressure low today in the clinic.  He denies any dizziness except if he stands up too fast.  Endorses adequate fluid intake during the day.  Blood pressure readings at home have been 89-102/58-61. Prediabetes/obesity: Doing well with eating habits.  He watches his grandson during the day and this keeps him active.  They sometimes walk to the park. Tobacco dependence: Still smoking 1 pack a day.  Ready to quit.  Would like to try the patches again.  He is smoked since the age of 45.  Qualifies for lung cancer screening and is agreeable to screening.      Patient Active Problem List   Diagnosis Date Noted   TIA (transient ischemic attack) 21/19/4174   Chronic systolic CHF (congestive heart failure) ([place]) 05/04/2022   Expressive dysphasia 05/04/2022   Unsteady gait 05/04/2022   Near syncope 11/27/4816   Chronic systolic heart failure ([place]) 12/01/2021   ICD (implantable cardioverter-defibrillator) in place 12/01/2021   Rectal bleed 03/06/2021   Unstable angina (HCC)    Hx of CABG 07/09/2019   Lumbar radiculopathy 12/04/2018   Glaucoma 12/04/2018   Cortical age-related cataract of both eyes 12/04/2018   Pain management contract agreement 12/04/2018   Lumbar herniated disc 06/04/2018   Prediabetes 01/01/2018   Hyperlipidemia 12/30/2017   Obesity (BMI 30.0-34.9) 12/30/2017   Benign neoplasm of descending colon    Essential hypertension 11/03/2015   Cardiomyopathy, ischemic 56/31/4970   Chronic systolic dysfunction of left ventricle 06/03/2015   Erectile dysfunction  due to arterial insufficiency 12/23/2014   Lipoma of skin and subcutaneous tissue of neck 05/24/2014   History of cardioembolic stroke 40/97/3532   Vision loss of right eye 06/23/2013   Dyslipidemia 06/22/2013   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 Restart on 07/05/2018 90 tablet 3   atorvastatin (LIPITOR) 80 MG tablet TAKE 1 TABLET EVERY DAY (Patient taking differently: Take 80 mg by mouth daily.) 90 tablet 0   cetirizine (ZYRTEC) 10 MG tablet Take 1 tablet (10 mg total) by mouth daily. 30 tablet 1   Cholecalciferol (VITAMIN D) 125 MCG (5000 UT) CAPS Take 5,000 Units by mouth daily.     [START ON 06/04/2022] clopidogrel (PLAVIX) 75 MG tablet Take 1 tablet (75 mg total) by mouth daily. Start after 30 day course of brilinta has been completed 30 tablet 1   dapagliflozin propanediol (FARXIGA) 10 MG TABS tablet TAKE 1 TABLET EVERY DAY (Patient taking differently: Take 10 mg by mouth daily.) 90 tablet 3   ENTRESTO 97-103 MG TAKE 1 TABLET TWICE DAILY (Patient taking differently: Take 1 tablet by mouth 2 (two) times daily.) 180 tablet 3   fluticasone (FLONASE) 50 MCG/ACT nasal spray Place 1 spray into both nostrils daily. 16 g 2   furosemide (LASIX) 20 MG tablet Take 1 tablet (20 mg total) by mouth daily. 90 tablet 3   gabapentin (NEURONTIN) 300 MG capsule Take 1 capsule (300 mg total) by mouth at bedtime. 30 capsule 0   latanoprost (XALATAN) 0.005 % ophthalmic solution Place 1 drop into both eyes at bedtime.  12   methocarbamol (ROBAXIN) 500 MG tablet TAKE 1 TABLET (500 MG TOTAL) BY MOUTH 2 (TWO) TIMES DAILY AS NEEDED FOR MUSCLE SPASMS. 30 tablet 2   Multiple Vitamin (MULTIVITAMIN WITH MINERALS) TABS tablet Take 1 tablet by mouth daily. Centrum Silver     nitroGLYCERIN (NITROSTAT) 0.4 MG SL tablet Place 1 tablet (0.4 mg total) under the tongue every 5 (five) minutes x 3 doses as needed for chest pain. 25 tablet 1   ticagrelor  (BRILINTA) 90 MG TABS tablet Take 1 tablet (90 mg total) by mouth 2 (two) times daily. 60 tablet 0   No current facility-administered medications on file prior to visit.    No Known Allergies  Social History   Socioeconomic History   Marital status: Single    Spouse name: Not on file   Number of children: 2   Years of education: 12th   Highest education level: Not on file  Occupational History   Occupation: N/A  Tobacco Use   Smoking status: Every Day    Packs/day: 1.00    Years: 35.00    Total pack years: 35.00    Types: Cigarettes   Smokeless tobacco: Never  Vaping Use   Vaping Use: Never used  Substance and Sexual Activity   Alcohol use: No    Alcohol/week: 0.0 standard drinks of alcohol   Drug use: No   Sexual activity: Yes  Other Topics Concern   Not on file  Social History Narrative   Lives in [place] with wife.  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  He is now retired on disability.  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      Caffeine Use: very rarely   Social Determinants of Health   Financial Resource Strain: Not on file  Food Insecurity: Not on file  Transportation Needs: Not on file  Physical Activity: Not on file  Stress: Not on file  Social Connections: Not on file  Intimate Partner Violence: Not on file    Family History  Problem Relation Age of Onset   Heart attack Mother    Diabetes Mother    Hypertension Mother    Hypertension Father    CAD Other    HIV Brother    Stroke Neg Hx     Past Surgical History:  Procedure Laterality Date   CARDIAC CATHETERIZATION  01/06/2013   CATARACT EXTRACTION Bilateral 05/2021   COLONOSCOPY WITH PROPOFOL N/A 07/03/2016   Procedure: COLONOSCOPY WITH PROPOFOL;  Surgeon: Manus Gunning, MD;  Location: [facility];  Service: Gastroenterology;  Laterality: N/A;   CORONARY ANGIOPLASTY WITH STENT PLACEMENT  2000's X 2   "1 + 1" (01/06/2013)   CORONARY ARTERY BYPASS GRAFT N/A  01/28/2013   Procedure: CORONARY ARTERY BYPASS GRAFTING (CABG);  Surgeon: Melrose Nakayama, MD;  Location: [place];  Service: Open Heart Surgery;  Laterality: N/A;  CABG x one, using left greater saphenous vein harvested endoscopically   ICD IMPLANT N/A 05/25/2020   Procedure: ICD IMPLANT;  Surgeon: Thompson Grayer, MD;  Location: [place] [facility];  Service: Cardiovascular;  Laterality: N/A;   INTRAOPERATIVE TRANSESOPHAGEAL ECHOCARDIOGRAM N/A 01/28/2013   Procedure: INTRAOPERATIVE TRANSESOPHAGEAL ECHOCARDIOGRAM;  Surgeon: Melrose Nakayama, MD;  Location: [place];  Service: Open Heart Surgery;  Laterality: N/A;   LEFT HEART CATH AND CORS/GRAFTS ANGIOGRAPHY N/A 09/13/2020   Procedure: LEFT HEART CATH AND CORS/GRAFTS ANGIOGRAPHY;  Surgeon: Martinique, Peter M, MD;  Location: [place] [facility];  Service: Cardiovascular;  Laterality: N/A;   LEFT HEART CATHETERIZATION WITH CORONARY ANGIOGRAM N/A 01/06/2013   Procedure: LEFT HEART CATHETERIZATION WITH CORONARY ANGIOGRAM;  Surgeon: Peter M Martinique, MD;  Location: [facility] CATH LAB;  Service: Cardiovascular;  Laterality: N/A;   LEFT HEART CATHETERIZATION WITH CORONARY ANGIOGRAM N/A 01/21/2013   Procedure: LEFT HEART CATHETERIZATION WITH CORONARY ANGIOGRAM;  Surgeon: Burnell Blanks, MD;  Location: [facility] CATH LAB;  Service: Cardiovascular;  Laterality: N/A;   LUMBAR LAMINECTOMY/ DECOMPRESSION WITH MET-RX Right 06/30/2018   Procedure: Right minimally invasive Lumbar Three-Four Far lateral discectomy;  Surgeon: Judith Part, MD;  Location: [place];  Service: Neurosurgery;  Laterality: Right;  Right minimally invasive Lumbar Three-Four Far lateral discectomy   MASS EXCISION Right 07/21/2014   Procedure: EXCISION RIGHT NECK MASS;  Surgeon: Erroll Luna, MD;  Location: [place];  Service: General;  Laterality: Right;   PERICARDIAL TAP N/A 01/24/2013   Procedure: PERICARDIAL TAP;  Surgeon: Blane Ohara, MD;  Location: [facility] CATH LAB;   Service: Cardiovascular;  Laterality: N/A;   RIGHT HEART CATHETERIZATION Right 01/24/2013   Procedure: RIGHT HEART CATH;  Surgeon: Blane Ohara, MD;  Location: [facility] CATH LAB;  Service: Cardiovascular;  Laterality: Right;   VENTRICULAR ANEURYSM RESECTION N/A 01/28/2013   Procedure: LEFT VENTRICULAR ANEURYSM REPAIR;  Surgeon: Melrose Nakayama, MD;  Location: [place];  Service: Open Heart Surgery;  Laterality:  N/A;    ROS: Review of Systems Negative except as stated above  PHYSICAL EXAM: BP (!) 77/52 (BP Location: Left Arm, Patient Position: Sitting, Cuff Size: Large)   Pulse 63   Temp 97.8 F (36.6 C) (Oral)   Ht '6\' 1"'$  (1.854 m)   Wt 234 lb (106.1 kg)   SpO2 96%   BMI 30.87 kg/m   Wt Readings from Last 3 Encounters:  05/08/22 234 lb (106.1 kg)  05/04/22 238 lb (108 kg)  04/30/22 238 lb (108 kg)   Repeat blood pressure 80/62 Physical Exam   General appearance - alert, well appearing, older AAM and in no distress Mental status - normal mood, behavior, speech, dress, motor activity, and thought processes Neck - supple, no significant adenopathy Chest - clear to auscultation, no wheezes, rales or rhonchi, symmetric air entry Heart - normal rate, regular rhythm, normal S1, S2, no murmurs, rubs, clicks or gallops Extremities - peripheral pulses normal, no pedal edema, no clubbing or cyanosis     Latest Ref Rng & Units 05/04/2022    2:42 AM 05/04/2022    1:30 AM 05/25/2021    8:35 AM  CMP  Glucose 70 - 99 mg/dL 131  114  115   BUN 6 - 20 mg/dL '13  13  12   '$ Creatinine 0.61 - 1.24 mg/dL 1.00  1.13  1.05   Sodium 135 - 145 mmol/L 140  139  139   Potassium 3.5 - 5.1 mmol/L 3.7  3.7  4.6   Chloride 98 - 111 mmol/L 103  102  101   CO2 22 - 32 mmol/L  25  23   Calcium 8.9 - 10.3 mg/dL  9.5  9.5   Total Protein 6.0 - 8.5 g/dL   6.8   Total Bilirubin 0.0 - 1.2 mg/dL   0.4   Alkaline Phos 44 - 121 IU/L   147   AST 0 - 40 IU/L   17   ALT 0 - 44 IU/L   15    Lipid Panel      Component Value Date/Time   CHOL 129 05/04/2022 0621   CHOL 148 05/25/2021 0835   TRIG 91 05/04/2022 0621   HDL 49 05/04/2022 0621   HDL 56 05/25/2021 0835   CHOLHDL 2.6 05/04/2022 0621   VLDL 18 05/04/2022 0621   LDLCALC 62 05/04/2022 0621   LDLCALC 72 05/25/2021 0835    CBC    Component Value Date/Time   WBC 6.0 05/04/2022 0130   RBC 5.15 05/04/2022 0130   HGB 15.0 05/04/2022 0242   HGB 15.8 03/06/2021 1459   HCT 44.0 05/04/2022 0242   HCT 45.4 03/06/2021 1459   PLT 123 (L) 05/04/2022 0130   PLT 178 03/06/2021 1459   MCV 85.8 05/04/2022 0130   MCV 85 03/06/2021 1459   MCH 30.1 05/04/2022 0130   MCHC 35.1 05/04/2022 0130   RDW 12.9 05/04/2022 0130   RDW 13.9 03/06/2021 1459   LYMPHSABS 2.2 05/04/2022 0130   LYMPHSABS 2.9 03/06/2021 1459   MONOABS 0.5 05/04/2022 0130   EOSABS 0.1 05/04/2022 0130   EOSABS 0.1 03/06/2021 1459   BASOSABS 0.0 05/04/2022 0130   BASOSABS 0.1 03/06/2021 1459    ASSESSMENT AND PLAN: 1. Encounter to establish care   2. History of CVA (cerebrovascular accident) without residual deficits Advised patient to hold Plavix until he has completed the 4 weeks of Brilinta.  After that he should restart the Plavix and continue taking with the aspirin  daily. Will arrange follow-up with neurology. Discussed the importance of smoking cessation, blood pressure control, and normal LDL 3. Other specified hypotension Blood pressure low today and he also reports low readings at home.  I recommend decreasing the carvedilol to 12.5 mg twice a day and spironolactone to 12.5 mg daily.  4. Tobacco dependence Pt is current smoker. Patient advised to quit smoking. Discussed health risks associated with smoking including lung and other types of cancers, chronic lung diseases and CV risks.. Pt ready/not ready to give trail of quitting.   Discussed methods to help quit including quitting cold [place], use of NRT, Chantix and Bupropion.  Pt wanting to try: Nicotine  patches using stepdown approach. _3_ Minutes spent on counseling. F/U: Reassess progress on subsequent visit.  - nicotine (NICODERM CQ - DOSED IN MG/24 HR) 7 mg/24hr patch; Place 1 patch (7 mg total) onto the skin daily.  Dispense: 28 patch; Refill: 1  5. Prediabetes Discussed on encourage healthy eating habits.  He will continue to move is much as he can.  6. Coronary artery disease involving coronary bypass graft of native heart without angina pectoris Stable.  We have decreased his carvedilol to 12.5 mg twice a day given his low blood pressure readings and orthostatic dizziness when he stands up.  Continue atorvastatin.  7. Chronic systolic dysfunction of left ventricle Continue Entresto, furosemide, Farxiga.  I have decreased the dose of the carvedilol and spironolactone due to hypotension. - carvedilol (COREG) 25 MG tablet; Take 0.5 tablets (12.5 mg total) by mouth 2 (two) times daily with a meal.  Dispense: 180 tablet; Refill: 3 - spironolactone (ALDACTONE) 25 MG tablet; Take 0.5 tablets (12.5 mg total) by mouth daily.  Dispense: 45 tablet; Refill: 1  8. Thrombocytopenia (HCC) Mild.  We will recheck CBC on subsequent visit  9. Obesity (BMI 30.0-34.9) Patient advised to eliminate sugary drinks from the diet, cut back on portion sizes especially of white carbohydrates, eat more white lean meat like chicken [place] and seafood instead of beef or pork and incorporate fresh fruits and vegetables into the diet daily.   10. Screening for lung cancer Patient with greater than 20-pack-year smoking history.  He is agreeable to lung cancer screening. - CT CHEST LUNG CA SCREEN LOW DOSE W/O CM; Future   Patient reports having had flu and COVID booster vaccines around October of last year at [facility].  Patient was given the opportunity to ask questions.  Patient verbalized understanding of the plan and was able to repeat key elements of the plan.   This documentation was completed  using Radio producer.  Any transcriptional errors are unintentional.  Orders Placed This Encounter  Procedures   CT CHEST LUNG CA SCREEN LOW DOSE W/O CM     Requested Prescriptions   Signed Prescriptions Disp Refills   nicotine (NICODERM CQ - DOSED IN MG/24 HR) 7 mg/24hr patch 28 patch 1    Sig: Place 1 patch (7 mg total) onto the skin daily.   carvedilol (COREG) 25 MG tablet 180 tablet 3    Sig: Take 0.5 tablets (12.5 mg total) by mouth 2 (two) times daily with a meal.   spironolactone (ALDACTONE) 25 MG tablet 45 tablet 1    Sig: Take 0.5 tablets (12.5 mg total) by mouth daily.    No follow-ups on file.  Karle Plumber, MD, FACP

## 2022-05-08 NOTE — Patient Instructions (Addendum)
Decrease Carvedilol to 25 mg 1/2 tablet twice a day.  Decrease Spironolactone to 25 mg 1/2 tablet daily.  Take the Brilinta daily for 4 weeks.  Hold off on taking the Plavix while you are taking Brilinta.  Once you have finished the 4 weeks of Brilinta, restart Plavix 75 mg daily.

## 2022-05-09 ENCOUNTER — Encounter: Payer: Self-pay | Admitting: Internal Medicine

## 2022-05-09 NOTE — Progress Notes (Signed)
EPIC Encounter for ICM Monitoring  Patient Name: [name] is a 62 y.o. male Date: 05/09/2022 Primary Care Physican: Loura Pardon, MD Primary Cardiologist: [place] Electrophysiologist: Curt Bears 11/28/2021 Weight: 242 lbs        02/27/2022 Weight: 232 lbs                                                   Spoke with patient and heart failure questions reviewed.  Transmission results reviewed.  Pt asymptomatic for fluid accumulation.  Pt is recovering from a minor stroke on 05/04/2022 and starting to feel better.     CorVue thoracic impedance normal but was suggesting possible fluid accumulation from 1/8-1/17.    Prescribed: Furosemide 20 mg take 1 tablet by mouth daily. Spironolactone 25 mg take 1 tablet daily   Labs: 05/25/2021 Creatinine 1.05, BUN 12, Potassium 4.6, Sodium 139, GFR 82 11/30/2020 Creatinine 1.07, BUN 14, Potassium 4.0, Sodium 138, GFR >60 09/10/2020 Creatinine 1.18, BUN 14, Potassium 4.3, Sodium 141, GFR >60  A complete set of results can be found in Results Review.   Recommendations:  No changes and encouraged to call if experiencing any fluid symptoms.   Follow-up plan: ICM clinic phone appointment on 06/11/2022.   91 day device clinic remote transmission 05/23/2022.     EP/Cardiology Office Visits:  06/04/2022 with Oda Kilts, PA.   Copy of ICM check sent to Dr. Curt Bears.  3 month ICM trend: 05/07/2022.    12-14 Month ICM trend:     Rosalene Billings, RN 05/09/2022 3:39 PM

## 2022-05-20 ENCOUNTER — Ambulatory Visit: Payer: Medicare HMO | Attending: Cardiology

## 2022-05-20 DIAGNOSIS — I440 Atrioventricular block, first degree: Secondary | ICD-10-CM

## 2022-05-20 DIAGNOSIS — R55 Syncope and collapse: Secondary | ICD-10-CM | POA: Diagnosis not present

## 2022-05-20 DIAGNOSIS — I639 Cerebral infarction, unspecified: Secondary | ICD-10-CM | POA: Diagnosis not present

## 2022-05-22 LAB — CUP PACEART REMOTE DEVICE CHECK
Battery Remaining Longevity: 96 mo
Battery Remaining Percentage: 82 %
Battery Voltage: 2.99 V
Brady Statistic RV Percent Paced: 1 %
Date Time Interrogation Session: 20240206020008
HighPow Impedance: 68 Ohm
Implantable Lead Connection Status: 753985
Implantable Lead Implant Date: 20220209
Implantable Lead Location: 753860
Implantable Pulse Generator Implant Date: 20220209
Lead Channel Impedance Value: 390 Ohm
Lead Channel Pacing Threshold Amplitude: 3.75 V
Lead Channel Pacing Threshold Pulse Width: 0.5 ms
Lead Channel Sensing Intrinsic Amplitude: 5.9 mV
Lead Channel Setting Pacing Amplitude: 2.625
Lead Channel Setting Pacing Pulse Width: 0.5 ms
Lead Channel Setting Sensing Sensitivity: 0.5 mV
Pulse Gen Serial Number: 111033765
Zone Setting Status: 755011

## 2022-05-23 ENCOUNTER — Ambulatory Visit (INDEPENDENT_AMBULATORY_CARE_PROVIDER_SITE_OTHER): Payer: Medicare HMO

## 2022-05-23 DIAGNOSIS — I255 Ischemic cardiomyopathy: Secondary | ICD-10-CM | POA: Diagnosis not present

## 2022-05-28 ENCOUNTER — Ambulatory Visit
Admission: RE | Admit: 2022-05-28 | Discharge: 2022-05-28 | Disposition: A | Discharge status: Home / Self Care | Payer: Medicare HMO | Source: Ambulatory Visit | Attending: Internal Medicine | Admitting: Internal Medicine

## 2022-05-28 DIAGNOSIS — Z122 Encounter for screening for malignant neoplasm of respiratory organs: Secondary | ICD-10-CM

## 2022-05-30 ENCOUNTER — Telehealth: Payer: Self-pay | Admitting: Internal Medicine

## 2022-05-30 NOTE — Telephone Encounter (Signed)
Phone call placed to patient today to go over the results of the screening CAT scan of the lungs.  Patient informed that it did not show any suspicious masses or nodules in the lungs.  Incidental finding was emphysematous changes and aortic atherosclerosis.  Strongly encouraged him to work on trying to quit smoking.  He is already on atorvastatin.  No further action needed at this time. Patient then tells me that I will be receiving a form for request of increased PCS hours.  He receives 2-1/2 hours 5 days a week of PCS services.  He states that he has had PCS services for 1 year.  He lives alone.  Feels that the current hours are not sufficient.  Needs help with bathing, meal preps and light housekeeping due to his heart condition.  I told him that I would have a look at the form when it crosses my desk.

## 2022-06-02 NOTE — Progress Notes (Unsigned)
Electrophysiology Office Note Date: 06/04/2022  ID:  [name], DOB [birth year], MRN [id number]  PCP: Robert Pier, MD Primary Cardiologist: Robert Martinique, MD Electrophysiologist: Dr. Rayann Tucker -> Robert Meredith Leeds, MD   CC: Routine ICD follow-up  [name] is a 62 y.o. male seen today for Robert Meredith Leeds, MD for post hospital follow up.    Admitted 05/04/2022 with acute CVA. Symptoms of dysphasia, R hand discoordination, and dizziness. MRI showed a small right cerebellar stroke   Since discharge from hospital the patient reports doing very well. Denies chronic deficits from his stroke.  he denies chest pain, palpitations, dyspnea, PND, orthopnea, nausea, vomiting, dizziness, syncope, edema, weight gain, or early satiety.   He has not had ICD shocks.   Device History: Abbott Single Chamber ICD implanted 05/2020 for CHF   Past Medical History:  Diagnosis Date   Abnormal EKG    hx of ischemia showing up on ekg's   Acute myopericarditis    a. 03/2013: readm with hypoxia, tachycardia, elevated ESR/CRP, elevated troponin with Dressler syndrome and myopericarditis; treated with steroids.   Acute respiratory failure ([place])    a. 01/2013: VDRF. b. 03/2013: hypoxia requiring supp O2 during adm, resolved by discharge.   C. difficile colitis    a. 01/2013 during prolonged adm. b. Recurred 03/2013.   Cardiac tamponade    a. 01/2013 s/p drain.   Cataracts, bilateral    Coronary artery disease    a. s/p MI in 2009 in [place] with stenting of the LCX and LAD;  b. 12/2012 NSTEMI/CAD: LM nl, LAD patent prox stent, LCX 50-70 isr (FFR 0.84), RCA dom, 169m EF 40-45%-->Med Rx. c. 01/2013: anterolateral STEMI complicated by pericardial effusion (presumed purulent pericarditis) and tamponade s/p drain, ruptured LV pseudoaneurysm s/p CorMatrix patch, CABGx1 (SVG-OM1), fever, CVA, VDRF, C diff.   CVA (cerebral infarction)    a. 01/2013 in setting of prolonged hospitalization -  residual L arm weakness.   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 03/2013: readm with hypoxia, tachycardia, elevated ESR/CRP, elevated troponin with Dressler syndrome and myopericarditis; treated with steroids.   Glaucoma    Hyperlipidemia    Hypokalemia    Hyponatremia    a. During late 2014.   Ischemic cardiomyopathy    a. Sept/Oct 2014: EF ~40% (ICM). b. 03/2013: EF 25-30%. Off ACEI due to hypotension (MIXED NICM/ICM).   Optic neuropathy, ischemic    Pericardial effusion    a. 01/2013: pericardial effusion (presumed purulent pericarditis) and cardiac tamponade s/p drain. b. Persistent moderate pericardial effusion 03/2013.   Pre-diabetes    Pseudoaneurysm of left ventricle of heart    a. 01/2013:  Ruptured inferoposterior LV pseudoaneurysm s/p CorMatrix patch.   Sinus bradycardia    asymptomatic   Stroke ([facility] 2009   weak lt side-lt arm   Tobacco abuse    Wears dentures    top    Current Outpatient Medications  Medication Instructions   ALPHAGAN P 0.1 % SOLN INSTILL 1 DROP INTO BOTH EYES TWICE A DAY   aspirin EC 81 mg, Oral, Daily, Restart on 07/05/2018   atorvastatin (LIPITOR) 80 MG tablet TAKE 1 TABLET EVERY DAY   carvedilol (COREG) 12.5 mg, Oral, 2 times daily with meals   cetirizine (ZYRTEC) 10 mg, Oral, Daily   clopidogrel (PLAVIX) 75 mg, Oral, Daily, Start after 30 day course of brilinta has been completed   dapagliflozin propanediol (FARXIGA) 10 MG TABS tablet TAKE 1 TABLET EVERY DAY   ENTRESTO  97-103 MG TAKE 1 TABLET TWICE DAILY   fluticasone (FLONASE) 50 MCG/ACT nasal spray 1 spray, Each Nare, Daily   furosemide (LASIX) 20 mg, Oral, Daily   gabapentin (NEURONTIN) 300 mg, Oral, Daily at bedtime   latanoprost (XALATAN) 0.005 % ophthalmic solution 1 drop, Both Eyes, Daily at bedtime   methocarbamol (ROBAXIN) 500 mg, Oral, 2 times daily PRN   Multiple Vitamin (MULTIVITAMIN WITH MINERALS) TABS tablet 1 tablet, Oral, Daily, Centrum Silver   nicotine (NICODERM CQ - DOSED IN MG/24  HR) 7 mg, Transdermal, Daily   nitroGLYCERIN (NITROSTAT) 0.4 mg, Sublingual, Every 5 min x3 PRN   spironolactone (ALDACTONE) 12.5 mg, Oral, Daily   Vitamin D 5,000 Units, Oral, Daily    Family History: Family History  Problem Relation Age of Onset   Heart attack Mother    Diabetes Mother    Hypertension Mother    Hypertension Father    CAD Other    HIV Brother    Stroke Neg Hx     Physical Exam: Vitals:   06/04/22 1101  BP: 100/68  Pulse: 61  SpO2: 98%  Weight: 235 lb (106.6 kg)  Height: 6' 1"$  (1.854 m)     GEN- NAD. A&O x 3. Normal affect. HEENT: Normocephalic, atraumatic Lungs- CTAB, Normal effort.  Heart- Regular rate and rhythm rate and rhythm. No M/G/R.  Extremities- No peripheral edema. no clubbing or cyanosis Skin- warm and dry, no rash or lesion; ICD pocket well healed  ICD interrogation- reviewed in detail today,  See PACEART report  EKG is not ordered today. EKG from 05/04/2022 reviewed which showed NSR at 60 bpm  Other studies Reviewed: Additional studies/ records that were reviewed today include: Previous EP office notes, recent admission notes   Assessment and Plan:  1.  Chronic systolic dysfunction s/p Abbott single chamber ICD  euvolemic today Stable on an appropriate medical regimen Normal ICD function See Pace Art report No changes today  2. CAD Denies s/s ischemia  3. H/o CVA No remaining deficit.  He has a 30 day monitor on.  We discussed what long-term monitoring may look like if AF not seen on his event monitor.  We discussed potentially monitoring with an apple watch as well.   Current medicines are reviewed at length with the patient today.     Disposition:   Follow up with Dr. Curt Bears in 6 months   Signed, Shirley Friar, PA-C  06/04/2022 11:10 AM  [facility] [address] [place] [address] [phone] (office) [phone] (fax)

## 2022-06-04 ENCOUNTER — Ambulatory Visit: Payer: Medicare HMO | Attending: Student | Admitting: Student

## 2022-06-04 ENCOUNTER — Encounter: Payer: Self-pay | Admitting: Student

## 2022-06-04 VITALS — BP 100/68 | HR 61 | Ht 73.0 in | Wt 235.0 lb

## 2022-06-04 DIAGNOSIS — I2510 Atherosclerotic heart disease of native coronary artery without angina pectoris: Secondary | ICD-10-CM

## 2022-06-04 DIAGNOSIS — I639 Cerebral infarction, unspecified: Secondary | ICD-10-CM

## 2022-06-04 DIAGNOSIS — I5022 Chronic systolic (congestive) heart failure: Secondary | ICD-10-CM | POA: Diagnosis not present

## 2022-06-04 LAB — CUP PACEART INCLINIC DEVICE CHECK
Battery Remaining Longevity: 104 mo
Brady Statistic RV Percent Paced: 0 %
Date Time Interrogation Session: 20240219113933
HighPow Impedance: 66.375
Implantable Lead Connection Status: 753985
Implantable Lead Implant Date: 20220209
Implantable Lead Location: 753860
Implantable Pulse Generator Implant Date: 20220209
Lead Channel Impedance Value: 387.5 Ohm
Lead Channel Pacing Threshold Amplitude: 3 V
Lead Channel Pacing Threshold Pulse Width: 0.5 ms
Lead Channel Sensing Intrinsic Amplitude: 6.8 mV
Lead Channel Setting Pacing Amplitude: 3.25 V
Lead Channel Setting Pacing Pulse Width: 0.5 ms
Lead Channel Setting Sensing Sensitivity: 0.5 mV
Pulse Gen Serial Number: 111033765
Zone Setting Status: 755011

## 2022-06-04 NOTE — Patient Instructions (Signed)
Medication Instructions:  Your physician recommends that you continue on your current medications as directed. Please refer to the Current Medication list given to you today.  *If you need a refill on your cardiac medications before your next appointment, please call your pharmacy*   Lab Work: None If you have labs (blood work) drawn today and your tests are completely normal, you will receive your results only by: [place] (if you have MyChart) OR A paper copy in the mail If you have any lab test that is abnormal or we need to change your treatment, we will call you to review the results.   Follow-Up: At [facility], you and your health needs are our priority.  As part of our continuing mission to provide you with exceptional heart care, we have created designated Provider Care Teams.  These Care Teams include your primary Cardiologist (physician) and Advanced Practice Providers (APPs -  Physician Assistants and Nurse Practitioners) who all work together to provide you with the care you need, when you need it.   Your next appointment:   6 month(s)  Provider:   Allegra Lai, MD

## 2022-06-07 ENCOUNTER — Other Ambulatory Visit: Payer: Self-pay | Admitting: Internal Medicine

## 2022-06-07 DIAGNOSIS — E785 Hyperlipidemia, unspecified: Secondary | ICD-10-CM

## 2022-06-07 DIAGNOSIS — I25810 Atherosclerosis of coronary artery bypass graft(s) without angina pectoris: Secondary | ICD-10-CM

## 2022-06-07 MED ORDER — ATORVASTATIN CALCIUM 80 MG PO TABS: 80.0000 mg | ORAL_TABLET | Freq: Every day | ORAL | 3 refills | Status: AC

## 2022-06-07 NOTE — Telephone Encounter (Signed)
Requested Prescriptions  Pending Prescriptions Disp Refills   atorvastatin (LIPITOR) 80 MG tablet 90 tablet 0    Sig: Take 1 tablet (80 mg total) by mouth daily.     Cardiovascular:  Antilipid - Statins Failed - 06/07/2022 12:28 PM      Failed - Lipid Panel in normal range within the last 12 months    Cholesterol, Total  Date Value Ref Range Status  05/25/2021 148 100 - 199 mg/dL Final   Cholesterol  Date Value Ref Range Status  05/04/2022 129 0 - 200 mg/dL Final   LDL Chol Calc (NIH)  Date Value Ref Range Status  05/25/2021 72 0 - 99 mg/dL Final   LDL Cholesterol  Date Value Ref Range Status  05/04/2022 62 0 - 99 mg/dL Final    Comment:           Total Cholesterol/HDL:CHD Risk Coronary Heart Disease Risk Table                     Men   Women  1/2 Average Risk   3.4   3.3  Average Risk       5.0   4.4  2 X Average Risk   9.6   7.1  3 X Average Risk  23.4   11.0        Use the calculated Patient Ratio above and the CHD Risk Table to determine the patient's CHD Risk.        ATP III CLASSIFICATION (LDL):  <100     mg/dL   Optimal  100-129  mg/dL   Near or Above                    Optimal  130-159  mg/dL   Borderline  160-189  mg/dL   High  >190     mg/dL   Very High Performed at [facility] [address], [place],  [address]    HDL  Date Value Ref Range Status  05/04/2022 49 >40 mg/dL Final  05/25/2021 56 >39 mg/dL Final   Triglycerides  Date Value Ref Range Status  05/04/2022 91 <150 mg/dL Final         Passed - Patient is not pregnant      Passed - Valid encounter within last 12 months    Recent Outpatient Visits           1 month ago Encounter to establish care   [place] Karle Plumber B, MD   12 months ago Obesity (BMI 30.0-34.9)   [place] Ladell Pier, MD   1 year ago Rectal bleed   [place] Elsie Stain,  MD   1 year ago Essential hypertension   [place] Ladell Pier, MD   1 year ago Need for shingles vaccine   [place], RPH-CPP       Future Appointments             In 1 week Monge, Helane Gunther, NP [place] at [facility]   In 3 weeks Garvin Fila, MD [facility]

## 2022-06-07 NOTE — Telephone Encounter (Signed)
Medication Refill - Medication: atorvastatin (LIPITOR) 80 MG tablet   Has the patient contacted their pharmacy? Yes.     Preferred Pharmacy (with phone number or street name):  [facility], [place] Phone: [phone]  Fax: [phone]     Has the patient been seen for an appointment in the last year OR does the patient have an upcoming appointment? Yes.    Please assist patient further

## 2022-06-11 ENCOUNTER — Other Ambulatory Visit: Payer: Self-pay | Admitting: Internal Medicine

## 2022-06-11 NOTE — Telephone Encounter (Signed)
Medication Refill - Medication: clopidogrel (PLAVIX) 75 MG tablet   Has the patient contacted their pharmacy? Yes.   (Agent: If no, request that the patient contact the pharmacy for the refill. If patient does not wish to contact the pharmacy document the reason why and proceed with request.) (Agent: If yes, when and what did the pharmacy advise?)  Preferred Pharmacy (with phone number or street name):  [id number] [place] [phone] Fax: [phone]    Has the patient been seen for an appointment in the last year OR does the patient have an upcoming appointment? Yes.    Agent: Please be advised that RX refills may take up to 3 business days. We ask that you follow-up with your pharmacy.  *patient states that rx that was sent on 2.19.24 was not received.

## 2022-06-12 NOTE — Telephone Encounter (Signed)
Requested medication (s) are due for refill today: For review  Requested medication (s) are on the active medication list: yes    Last refill: 05/04/22  #30  1 refill     ( Take 1 tablet (75 mg total) by mouth daily. Start after 30 day course of brilinta has been completed )  Future visit scheduled No  Notes to clinic:  Historical provider, please review. Thank you.  Requested Prescriptions  Pending Prescriptions Disp Refills   clopidogrel (PLAVIX) 75 MG tablet 30 tablet 1    Sig: Take 1 tablet (75 mg total) by mouth daily. Start after 30 day course of brilinta has been completed     Hematology: Antiplatelets - clopidogrel Failed - 06/11/2022  1:41 PM      Failed - PLT in normal range and within 180 days    Platelets  Date Value Ref Range Status  05/04/2022 123 (L) 150 - 400 K/uL Final  03/06/2021 178 150 - 450 x10E3/uL Final         Passed - HCT in normal range and within 180 days    HCT  Date Value Ref Range Status  05/04/2022 44.0 39.0 - 52.0 % Final   Hematocrit  Date Value Ref Range Status  03/06/2021 45.4 37.5 - 51.0 % Final         Passed - HGB in normal range and within 180 days    Hemoglobin  Date Value Ref Range Status  05/04/2022 15.0 13.0 - 17.0 g/dL Final  03/06/2021 15.8 13.0 - 17.7 g/dL Final         Passed - Cr in normal range and within 360 days    Creatinine  Date Value Ref Range Status  12/04/2018 139.0 20.0 - 300.0 mg/dL Final   Creat  Date Value Ref Range Status  11/03/2015 0.90 0.70 - 1.33 mg/dL Final    Comment:      For patients > or = 61 years of age: The upper reference limit for Creatinine is approximately 13% higher for people identified as African-American.      Creatinine, Ser  Date Value Ref Range Status  05/04/2022 1.00 0.61 - 1.24 mg/dL Final         Passed - Valid encounter within last 6 months    Recent Outpatient Visits           1 month ago Encounter to establish care   [place]  Karle Plumber B, MD   1 year ago Obesity (BMI 30.0-34.9)   [place] Ladell Pier, MD   1 year ago Rectal bleed   [place] Elsie Stain, MD   1 year ago Essential hypertension   [place] Ladell Pier, MD   1 year ago Need for shingles vaccine   [place], RPH-CPP       Future Appointments             In 3 days Monge, Helane Gunther, NP [place] at [facility]   In 2 weeks Garvin Fila, MD [facility]

## 2022-06-13 MED ORDER — CLOPIDOGREL BISULFATE 75 MG PO TABS: 75.0000 mg | ORAL_TABLET | Freq: Every day | ORAL | 0 refills | Status: DC

## 2022-06-15 ENCOUNTER — Ambulatory Visit: Payer: Medicare HMO | Admitting: Nurse Practitioner

## 2022-06-15 NOTE — Progress Notes (Signed)
No ICM remote transmission received for 06/11/2022 and next ICM transmission scheduled for 06/18/2022.

## 2022-06-18 NOTE — Progress Notes (Signed)
Remote ICD transmission.   

## 2022-06-20 ENCOUNTER — Other Ambulatory Visit: Payer: Self-pay | Admitting: Internal Medicine

## 2022-06-20 MED ORDER — CLOPIDOGREL BISULFATE 75 MG PO TABS: 75.0000 mg | ORAL_TABLET | Freq: Every day | ORAL | 1 refills | Status: DC

## 2022-06-22 ENCOUNTER — Telehealth: Payer: Self-pay

## 2022-06-22 NOTE — Telephone Encounter (Signed)
The patient agreed to send missed ICM transmission. 

## 2022-06-25 ENCOUNTER — Ambulatory Visit: Payer: Medicare HMO | Attending: Cardiology

## 2022-06-25 DIAGNOSIS — Z95810 Presence of automatic (implantable) cardiac defibrillator: Secondary | ICD-10-CM | POA: Diagnosis not present

## 2022-06-25 DIAGNOSIS — I5022 Chronic systolic (congestive) heart failure: Secondary | ICD-10-CM

## 2022-06-25 NOTE — Progress Notes (Signed)
EPIC Encounter for ICM Monitoring  Patient Name: [name] is a 62 y.o. male Date: 06/25/2022 Primary Care Physican: [name], MD Primary Cardiologist: [place] Electrophysiologist: Elberta Fortis 02/27/2022 Weight: 232 lbs  06/26/2022 Weight: 232 lbs                                                  Spoke with patient and heart failure questions reviewed.  Transmission results reviewed.  Pt asymptomatic for fluid accumulation.  Reports feeling well at this time and voices no complaints.  He may have been eating foods high in salt during decreased impedance.      CorVue thoracic impedance normal but was suggesting possible fluid accumulation from 2/19-3/4.    Prescribed: Furosemide 20 mg take 1 tablet by mouth daily. Spironolactone 25 mg take 1 tablet daily   Labs: 05/25/2021 Creatinine 1.05, BUN 12, Potassium 4.6, Sodium 139, GFR 82 11/30/2020 Creatinine 1.07, BUN 14, Potassium 4.0, Sodium 138, GFR >60 09/10/2020 Creatinine 1.18, BUN 14, Potassium 4.3, Sodium 141, GFR >60  A complete set of results can be found in Results Review.   Recommendations:  No changes and encouraged to call if experiencing any fluid symptoms.   Follow-up plan: ICM clinic phone appointment on 07/30/2022.   91 day device clinic remote transmission 08/22/2022.     EP/Cardiology Office Visits:  Recall 10/27/2022 with Dr [place].  Recall 12/03/2022 with Dr Elberta Fortis.   Copy of ICM check sent to Dr. Elberta Fortis.  3 month ICM trend: 06/25/2022.    12-14 Month ICM trend:     Karie Soda, RN 06/25/2022 10:49 AM

## 2022-07-02 ENCOUNTER — Ambulatory Visit (INDEPENDENT_AMBULATORY_CARE_PROVIDER_SITE_OTHER): Payer: Medicare HMO | Admitting: Neurology

## 2022-07-02 ENCOUNTER — Encounter: Payer: Self-pay | Admitting: Neurology

## 2022-07-02 VITALS — BP 110/75 | HR 75 | Ht 73.0 in | Wt 231.4 lb

## 2022-07-02 DIAGNOSIS — Z9189 Other specified personal risk factors, not elsewhere classified: Secondary | ICD-10-CM | POA: Diagnosis not present

## 2022-07-02 DIAGNOSIS — I639 Cerebral infarction, unspecified: Secondary | ICD-10-CM | POA: Diagnosis not present

## 2022-07-02 NOTE — Patient Instructions (Addendum)
I had a long d/w patient about his recent cryptogenic cerebellar stroke, risk for recurrent stroke/TIAs, personally independently reviewed imaging studies and stroke evaluation results and answered questions.Continue aspirin 81 mg daily and clopidogrel 75 mg daily given his history of significant coronary artery disease for secondary stroke prevention and maintain strict control of hypertension with blood pressure goal below 130/90, diabetes with hemoglobin A1c goal below 6.5% and lipids with LDL cholesterol goal below 70 mg/dL. I also advised the patient to eat a healthy diet with plenty of whole grains, cereals, fruits and vegetables, exercise regularly and maintain ideal body weight .recommend checking polysomnogram for obstructive sleep apnea as he appears to be at risk.  Followup in the future with my nurse practitioner in 6 months or call earlier if necessary.  Stroke Prevention Some medical conditions and behaviors can lead to a higher chance of having a stroke. You can help prevent a stroke by eating healthy, exercising, not smoking, and managing any medical conditions you have. Stroke is a leading cause of functional impairment. Primary prevention is particularly important because a majority of strokes are first-time events. Stroke changes the lives of not only those who experience a stroke but also their family and other caregivers. How can this condition affect me? A stroke is a medical emergency and should be treated right away. A stroke can lead to brain damage and can sometimes be life-threatening. If a person gets medical treatment right away, there is a better chance of surviving and recovering from a stroke. What can increase my risk? The following medical conditions may increase your risk of a stroke: Cardiovascular disease. High blood pressure (hypertension). Diabetes. High cholesterol. Sickle cell disease. Blood clotting disorders (hypercoagulable state). Obesity. Sleep disorders  (obstructive sleep apnea). Other risk factors include: Being older than age 80. Having a history of blood clots, stroke, or mini-stroke (transient ischemic attack, TIA). Genetic factors, such as race, ethnicity, or a family history of stroke. Smoking cigarettes or using other tobacco products. Taking birth control pills, especially if you also use tobacco. Heavy use of alcohol or drugs, especially cocaine and methamphetamine. Physical inactivity. What actions can I take to prevent this? Manage your health conditions High cholesterol levels. Eating a healthy diet is important for preventing high cholesterol. If cholesterol cannot be managed through diet alone, you may need to take medicines. Take any prescribed medicines to control your cholesterol as told by your health care provider. Hypertension. To reduce your risk of stroke, try to keep your blood pressure below 130/80. Eating a healthy diet and exercising regularly are important for controlling blood pressure. If these steps are not enough to manage your blood pressure, you may need to take medicines. Take any prescribed medicines to control hypertension as told by your health care provider. Ask your health care provider if you should monitor your blood pressure at home. Have your blood pressure checked every year, even if your blood pressure is normal. Blood pressure increases with age and some medical conditions. Diabetes. Eating a healthy diet and exercising regularly are important parts of managing your blood sugar (glucose). If your blood sugar cannot be managed through diet and exercise, you may need to take medicines. Take any prescribed medicines to control your diabetes as told by your health care provider. Get evaluated for obstructive sleep apnea. Talk to your health care provider about getting a sleep evaluation if you snore a lot or have excessive sleepiness. Make sure that any other medical conditions you have, such as  atrial fibrillation or atherosclerosis, are managed. Nutrition Follow instructions from your health care provider about what to eat or drink to help manage your health condition. These instructions may include: Reducing your daily calorie intake. Limiting how much salt (sodium) you use to 1,500 milligrams (mg) each day. Using only healthy fats for cooking, such as olive oil, canola oil, or sunflower oil. Eating healthy foods. You can do this by: Choosing foods that are high in fiber, such as whole grains, and fresh fruits and vegetables. Eating at least 5 servings of fruits and vegetables a day. Try to fill one-half of your plate with fruits and vegetables at each meal. Choosing lean protein foods, such as lean cuts of meat, poultry without skin, fish, tofu, beans, and nuts. Eating low-fat dairy products. Avoiding foods that are high in sodium. This can help lower blood pressure. Avoiding foods that have saturated fat, trans fat, and cholesterol. This can help prevent high cholesterol. Avoiding processed and prepared foods. Counting your daily carbohydrate intake.  Lifestyle If you drink alcohol: Limit how much you have to: 0-1 drink a day for women who are not pregnant. 0-2 drinks a day for men. Know how much alcohol is in your drink. In the [place] one drink equals one 12 oz bottle of beer (369mL), one 5 oz glass of wine (139mL), or one 1 oz glass of hard liquor (65mL). Do not use any products that contain nicotine or tobacco. These products include cigarettes, chewing tobacco, and vaping devices, such as e-cigarettes. If you need help quitting, ask your health care provider. Avoid secondhand smoke. Do not use drugs. Activity  Try to stay at a healthy weight. Get at least 30 minutes of exercise on most days, such as: Fast walking. Biking. Swimming. Medicines Take over-the-counter and prescription medicines only as told by your health care provider. Aspirin or blood thinners  (antiplatelets or anticoagulants) may be recommended to reduce your risk of forming blood clots that can lead to stroke. Avoid taking birth control pills. Talk to your health care provider about the risks of taking birth control pills if: You are over 19 years old. You smoke. You get very bad headaches. You have had a blood clot. Where to find more information American Stroke Association: www.strokeassociation.org Get help right away if: You or a loved one has any symptoms of a stroke. "BE FAST" is an easy way to remember the main warning signs of a stroke: B - Balance. Signs are dizziness, sudden trouble walking, or loss of balance. E - Eyes. Signs are trouble seeing or a sudden change in vision. F - Face. Signs are sudden weakness or numbness of the face, or the face or eyelid drooping on one side. A - Arms. Signs are weakness or numbness in an arm. This happens suddenly and usually on one side of the body. S - Speech. Signs are sudden trouble speaking, slurred speech, or trouble understanding what people say. T - Time. Time to call emergency services. Write down what time symptoms started. You or a loved one has other signs of a stroke, such as: A sudden, severe headache with no known cause. Nausea or vomiting. Seizure. These symptoms may represent a serious problem that is an emergency. Do not wait to see if the symptoms will go away. Get medical help right away. Call your local emergency services (911 in the [place] Do not drive yourself to the hospital. Summary You can help to prevent a stroke by eating healthy, exercising, not smoking,  limiting alcohol intake, and managing any medical conditions you may have. Do not use any products that contain nicotine or tobacco. These include cigarettes, chewing tobacco, and vaping devices, such as e-cigarettes. If you need help quitting, ask your health care provider. Remember "BE FAST" for warning signs of a stroke. Get help right away if you or a  loved one has any of these signs. This information is not intended to replace advice given to you by your health care provider. Make sure you discuss any questions you have with your health care provider. Document Revised: 10/15/2019 Document Reviewed: 11/02/2019 Elsevier Patient Education  [employer].

## 2022-07-02 NOTE — Progress Notes (Addendum)
[facility] [address] [place]. [place] [address] [phone]       OFFICE FOLLOW-UP NOTE  [name] Date of Birth:  1960/12/21 Medical Record Number:  [id number]   HPI: [name] is a pleasant 62 year old African-American male seen today for initial office follow-up visit following hospital consultation for stroke in January 2024.  History is obtained from the patient and review of electronic medical records.  I personally reviewed pertinent available imaging films in PACS.  He has past medical history of bilateral cerebellar strokes, coronary artery disease, status post MI and stent, hyperlipidemia, smoking and using marijuana.  He presented on 05/04/2022 with sudden onset of nausea, disequilibrium and right hand incoordination he was unable to play the button on his phone with his right hand.  Symptoms resolved by the time of this imaging alone.  NIH stroke scale was 0.  CT head was unremarkable and CT angiogram showed no large vessel stenosis or occlusion.  MRI scan showed right inferior cerebellar small infarct.  2D echo showed ejection fraction of 35 to 40% with inferior lateral appendixes with mild LVH and grade 2 diastolic dysfunction.  Positive right to left shunting was noted with bubble injection but interatrial septum was not visualized.  LDL cholesterol 62 mg percent.  Hemoglobin A1c was 6.0.  Patient was counseled to quit smoking cigarettes and marijuana and switch from aspirin and Plavix to aspirin and Brilinta for 4 weeks followed by aspirin Plavix due to affordability of Brilinta long-term..  Patient states is done well since discharge.  He is right-handed coordination has improved.  He is not had any dizzy spells.  He has cut back smoking significantly and smokes about half pack per day.  He also uses marijuana very occasionally.  He had a 30-day heart monitor as an outpatient which showed no evidence of fibrillation.  Patient has never been evaluated  for sleep apnea but appears to be at risk.  He is tolerating Lipitor well without muscle aches and pains.  His blood pressure is well-controlled today it is 110/75.  He remains on aspirin and Plavix and is only minor bruising and no bleeding.  ROS:   14 system review of systems is positive for dizziness, vomiting, incoordination, gait imbalance and all other systems negative  PMH:  Past Medical History:  Diagnosis Date   Abnormal EKG    hx of ischemia showing up on ekg's   Acute myopericarditis    a. 03/2013: readm with hypoxia, tachycardia, elevated ESR/CRP, elevated troponin with Dressler syndrome and myopericarditis; treated with steroids.   Acute respiratory failure ([place])    a. 01/2013: VDRF. b. 03/2013: hypoxia requiring supp O2 during adm, resolved by discharge.   C. difficile colitis    a. 01/2013 during prolonged adm. b. Recurred 03/2013.   Cardiac tamponade    a. 01/2013 s/p drain.   Cataracts, bilateral    Coronary artery disease    a. s/p MI in 2009 in [place] with stenting of the LCX and LAD;  b. 12/2012 NSTEMI/CAD: LM nl, LAD patent prox stent, LCX 50-70 isr (FFR 0.84), RCA dom, 172m, EF 40-45%-->Med Rx. c. 01/2013: anterolateral STEMI complicated by pericardial effusion (presumed purulent pericarditis) and tamponade s/p drain, ruptured LV pseudoaneurysm s/p CorMatrix patch, CABGx1 (SVG-OM1), fever, CVA, VDRF, C diff.   CVA (cerebral infarction)    a. 01/2013 in setting of prolonged hospitalization - residual L arm weakness.   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 03/2013: readm with hypoxia,  tachycardia, elevated ESR/CRP, elevated troponin with Dressler syndrome and myopericarditis; treated with steroids.   Glaucoma    Hyperlipidemia    Hypokalemia    Hyponatremia    a. During late 2014.   Ischemic cardiomyopathy    a. Sept/Oct 2014: EF ~40% (ICM). b. 03/2013: EF 25-30%. Off ACEI due to hypotension (MIXED NICM/ICM).   Optic neuropathy, ischemic    Pericardial effusion    a. 01/2013:  pericardial effusion (presumed purulent pericarditis) and cardiac tamponade s/p drain. b. Persistent moderate pericardial effusion 03/2013.   Pre-diabetes    Pseudoaneurysm of left ventricle of heart    a. 01/2013:  Ruptured inferoposterior LV pseudoaneurysm s/p CorMatrix patch.   Sinus bradycardia    asymptomatic   Stroke [facility]) 2009   weak lt side-lt arm   Tobacco abuse    Wears dentures    top    Social History:  Social History   Socioeconomic History   Marital status: Single    Spouse name: Not on file   Number of children: 2   Years of education: 12th   Highest education level: Not on file  Occupational History   Occupation: N/A  Tobacco Use   Smoking status: Every Day    Packs/day: 1.00    Years: 35.00    Additional pack years: 0.00    Total pack years: 35.00    Types: Cigarettes   Smokeless tobacco: Never  Vaping Use   Vaping Use: Never used  Substance and Sexual Activity   Alcohol use: No    Alcohol/week: 0.0 standard drinks of alcohol   Drug use: No   Sexual activity: Yes  Other Topics Concern   Not on file  Social History Narrative   Lives in [place] with wife.  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  He is now retired on disability.  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      Caffeine Use: very rarely   Social Determinants of Radio broadcast assistant Strain: Not on file  Food Insecurity: Not on file  Transportation Needs: Not on file  Physical Activity: Not on file  Stress: Not on file  Social Connections: Not on file  Intimate Partner Violence: Not on file    Medications:   Current Outpatient Medications on File Prior to Visit  Medication Sig Dispense Refill   ALPHAGAN P 0.1 % SOLN INSTILL 1 DROP INTO BOTH EYES TWICE A DAY (Patient taking differently: Place 1 drop into both eyes in the morning and at bedtime.) 10 mL 0   aspirin 81 MG EC tablet Take 1 tablet (81 mg total) by mouth daily. Restart on 07/05/2018 90 tablet 3    atorvastatin (LIPITOR) 80 MG tablet Take 1 tablet (80 mg total) by mouth daily. 90 tablet 3   carvedilol (COREG) 25 MG tablet Take 0.5 tablets (12.5 mg total) by mouth 2 (two) times daily with a meal. 180 tablet 3   cetirizine (ZYRTEC) 10 MG tablet Take 1 tablet (10 mg total) by mouth daily. 30 tablet 1   Cholecalciferol (VITAMIN D) 125 MCG (5000 UT) CAPS Take 5,000 Units by mouth daily.     clopidogrel (PLAVIX) 75 MG tablet Take 1 tablet (75 mg total) by mouth daily. 90 tablet 1   dapagliflozin propanediol (FARXIGA) 10 MG TABS tablet TAKE 1 TABLET EVERY DAY (Patient taking differently: Take 10 mg by mouth daily.) 90 tablet 3   ENTRESTO 97-103 MG TAKE 1 TABLET TWICE DAILY (Patient  taking differently: Take 1 tablet by mouth 2 (two) times daily.) 180 tablet 3   fluticasone (FLONASE) 50 MCG/ACT nasal spray Place 1 spray into both nostrils daily. 16 g 2   furosemide (LASIX) 20 MG tablet Take 1 tablet (20 mg total) by mouth daily. 90 tablet 3   gabapentin (NEURONTIN) 300 MG capsule Take 1 capsule (300 mg total) by mouth at bedtime. 30 capsule 0   latanoprost (XALATAN) 0.005 % ophthalmic solution Place 1 drop into both eyes at bedtime.  12   methocarbamol (ROBAXIN) 500 MG tablet TAKE 1 TABLET (500 MG TOTAL) BY MOUTH 2 (TWO) TIMES DAILY AS NEEDED FOR MUSCLE SPASMS. 30 tablet 2   Multiple Vitamin (MULTIVITAMIN WITH MINERALS) TABS tablet Take 1 tablet by mouth daily. Centrum Silver     nicotine (NICODERM CQ - DOSED IN MG/24 HR) 7 mg/24hr patch Place 1 patch (7 mg total) onto the skin daily. 28 patch 1   nitroGLYCERIN (NITROSTAT) 0.4 MG SL tablet Place 1 tablet (0.4 mg total) under the tongue every 5 (five) minutes x 3 doses as needed for chest pain. 25 tablet 1   spironolactone (ALDACTONE) 25 MG tablet Take 0.5 tablets (12.5 mg total) by mouth daily. 45 tablet 1   No current facility-administered medications on file prior to visit.    Allergies:  No Known Allergies  Physical Exam General: Mildly obese  middle-aged African-American male, seated, in no evident distress Head: head normocephalic and atraumatic.  Neck: supple with no carotid or supraclavicular bruits Cardiovascular: regular rate and rhythm, no murmurs Musculoskeletal: no deformity Skin:  no rash/petichiae Vascular:  Normal pulses all extremities Vitals:   07/02/22 1025  BP: 110/75  Pulse: 75   Neurologic Exam Mental Status: Awake and fully alert. Oriented to place and time. Recent and remote memory intact. Attention span, concentration and fund of knowledge appropriate. Mood and affect appropriate.  Cranial Nerves: Fundoscopic exam reveals sharp disc margins. Pupils equal, briskly reactive to light. Extraocular movements full without nystagmus.  Decreased vision bilaterally left side greater than right hearing intact. Facial sensation intact. Face, tongue, palate moves normally and symmetrically.  Motor: Normal bulk and tone. Normal strength in all tested extremity muscles. Sensory.: intact to touch ,pinprick .position and vibratory sensation.  Coordination: Rapid alternating movements normal in all extremities. Finger-to-nose and heel-to-shin performed accurately bilaterally. Gait and Station: Arises from chair without difficulty. Stance is normal. Gait demonstrates normal stride length and balance . Unable to heel, toe and tandem walk  .  Reflexes: 1+ and symmetric. Toes downgoing.   NIHSS  0 Modified Rankin  1   ASSESSMENT: 62 year old African-American male with right cerebellar infarct and January 2024 of cryptogenic etiology.  Vascular risk factors of coronary artery disease, cardiomyopathy, hyperlipidemia, hypertension, diabetes, at risk for sleep apnea, smoking cigarettes and marijuana.     PLAN:I had a long d/w patient about his recent cryptogenic cerebellar stroke, risk for recurrent stroke/TIAs, personally independently reviewed imaging studies and stroke evaluation results and answered questions.Continue aspirin  81 mg daily and clopidogrel 75 mg daily given his history of significant coronary artery disease for secondary stroke prevention and maintain strict control of hypertension with blood pressure goal below 130/90, diabetes with hemoglobin A1c goal below 6.5% and lipids with LDL cholesterol goal below 70 mg/dL. I also advised the patient to eat a healthy diet with plenty of whole grains, cereals, fruits and vegetables, exercise regularly and maintain ideal body weight .recommend checking polysomnogram for obstructive sleep apnea as he appears  to be at risk.  I also counseled him to quit smoking cigarettes and marijuana completely he expressed understanding.  Patient was found to have positive right to left shunt on 2D echo but he has a low  ROPE score  of 2 hence will not suggest endovascular PFO closure at the present time.  Followup in the future with my nurse practitioner in 6 months or call earlier if necessary.  Greater than 50% time during counseling and coordination of care about her cryptogenic stroke and questionable history of documented Greater than 50% of time during this 35 minute visit was spent on counseling,explanation of diagnosis, planning of further management, discussion with patient and family and coordination of care Antony Contras, MD Note: This document was prepared with digital dictation and possible smart phrase technology. Any transcriptional errors that result from this process are unintentional

## 2022-07-23 ENCOUNTER — Encounter: Payer: Self-pay | Admitting: Nurse Practitioner

## 2022-07-23 ENCOUNTER — Ambulatory Visit: Payer: Medicare HMO | Admitting: Nurse Practitioner

## 2022-07-23 ENCOUNTER — Ambulatory Visit: Payer: Medicare HMO | Attending: Nurse Practitioner | Admitting: Nurse Practitioner

## 2022-07-23 VITALS — BP 82/58 | HR 68 | Ht 73.0 in | Wt 233.0 lb

## 2022-07-23 DIAGNOSIS — R0609 Other forms of dyspnea: Secondary | ICD-10-CM

## 2022-07-23 DIAGNOSIS — E785 Hyperlipidemia, unspecified: Secondary | ICD-10-CM

## 2022-07-23 DIAGNOSIS — I5022 Chronic systolic (congestive) heart failure: Secondary | ICD-10-CM | POA: Diagnosis not present

## 2022-07-23 DIAGNOSIS — R7303 Prediabetes: Secondary | ICD-10-CM

## 2022-07-23 DIAGNOSIS — I2510 Atherosclerotic heart disease of native coronary artery without angina pectoris: Secondary | ICD-10-CM | POA: Diagnosis not present

## 2022-07-23 DIAGNOSIS — I10 Essential (primary) hypertension: Secondary | ICD-10-CM

## 2022-07-23 DIAGNOSIS — Z8673 Personal history of transient ischemic attack (TIA), and cerebral infarction without residual deficits: Secondary | ICD-10-CM | POA: Diagnosis not present

## 2022-07-23 DIAGNOSIS — R55 Syncope and collapse: Secondary | ICD-10-CM

## 2022-07-23 DIAGNOSIS — Z720 Tobacco use: Secondary | ICD-10-CM

## 2022-07-23 NOTE — Patient Instructions (Signed)
Medication Instructions:  Your physician recommends that you continue on your current medications as directed. Please refer to the Current Medication list given to you today.   *If you need a refill on your cardiac medications before your next appointment, please call your pharmacy*   Lab Work: NONE ordered at this time of appointment   If you have labs (blood work) drawn today and your tests are completely normal, you will receive your results only by: MyChart Message (if you have MyChart) OR A paper copy in the mail If you have any lab test that is abnormal or we need to change your treatment, we will call you to review the results.   Testing/Procedures: NONE ordered at this time of appointment     Follow-Up: At [facility], you and your health needs are our priority.  As part of our continuing mission to provide you with exceptional heart care, we have created designated Provider Care Teams.  These Care Teams include your primary Cardiologist (physician) and Advanced Practice Providers (APPs -  Physician Assistants and Nurse Practitioners) who all work together to provide you with the care you need, when you need it.  We recommend signing up for the patient portal called "MyChart".  Sign up information is provided on this After Visit Summary.  MyChart is used to connect with patients for Virtual Visits (Telemedicine).  Patients are able to view lab/test results, encounter notes, upcoming appointments, etc.  Non-urgent messages can be sent to your provider as well.   To learn more about what you can do with MyChart, go to [email or web address].    Your next appointment:   6 month(s)  Provider:   Peter Jordan, MD     Other Instructions   

## 2022-07-23 NOTE — Progress Notes (Signed)
Office Visit    Patient Name: [name] Date of Encounter: 07/23/2022  Primary Care Provider:  [name], MD Primary Cardiologist:  Peter Swaziland, MD  Chief Complaint    62 year old male with a history of  CAD s/p prior stenting to LCx, CABG x1 in 2014, chronic systolic heart failure, ICM s/p ICD, pericarditis, hypertension, hyperlipidemia, CVA, and prediabetes who presents for follow-up related to CAD, heart failure and near syncope.  Past Medical History    Past Medical History:  Diagnosis Date   Abnormal EKG    hx of ischemia showing up on ekg's   Acute myopericarditis    a. 03/2013: readm with hypoxia, tachycardia, elevated ESR/CRP, elevated troponin with Dressler syndrome and myopericarditis; treated with steroids.   Acute respiratory failure    a. 01/2013: VDRF. b. 03/2013: hypoxia requiring supp O2 during adm, resolved by discharge.   C. difficile colitis    a. 01/2013 during prolonged adm. b. Recurred 03/2013.   Cardiac tamponade    a. 01/2013 s/p drain.   Cataracts, bilateral    Coronary artery disease    a. s/p MI in 2009 in [place] with stenting of the LCX and LAD;  b. 12/2012 NSTEMI/CAD: LM nl, LAD patent prox stent, LCX 50-70 isr (FFR 0.84), RCA dom, 175m, EF 40-45%-->Med Rx. c. 01/2013: anterolateral STEMI complicated by pericardial effusion (presumed purulent pericarditis) and tamponade s/p drain, ruptured LV pseudoaneurysm s/p CorMatrix patch, CABGx1 (SVG-OM1), fever, CVA, VDRF, C diff.   CVA (cerebral infarction)    a. 01/2013 in setting of prolonged hospitalization - residual L arm weakness.   Dressler syndrome    a. 03/2013: readm with hypoxia, tachycardia, elevated ESR/CRP, elevated troponin with Dressler syndrome and myopericarditis; treated with steroids.   Glaucoma    Hyperlipidemia    Hypokalemia    Hyponatremia    a. During late 2014.   Ischemic cardiomyopathy    a. Sept/Oct 2014: EF ~40% (ICM). b. 03/2013: EF 25-30%. Off ACEI due to  hypotension (MIXED NICM/ICM).   Optic neuropathy, ischemic    Pericardial effusion    a. 01/2013: pericardial effusion (presumed purulent pericarditis) and cardiac tamponade s/p drain. b. Persistent moderate pericardial effusion 03/2013.   Pre-diabetes    Pseudoaneurysm of left ventricle of heart    a. 01/2013:  Ruptured inferoposterior LV pseudoaneurysm s/p CorMatrix patch.   Sinus bradycardia    asymptomatic   Stroke 2009   weak lt side-lt arm   Tobacco abuse    Wears dentures    top   Past Surgical History:  Procedure Laterality Date   CARDIAC CATHETERIZATION  01/06/2013   CATARACT EXTRACTION Bilateral 05/2021   COLONOSCOPY WITH PROPOFOL N/A 07/03/2016   Procedure: COLONOSCOPY WITH PROPOFOL;  Surgeon: Ruffin Frederick, MD;  Location: [facility];  Service: Gastroenterology;  Laterality: N/A;   CORONARY ANGIOPLASTY WITH STENT PLACEMENT  2000's X 2   "1 + 1" (01/06/2013)   CORONARY ARTERY BYPASS GRAFT N/A 01/28/2013   Procedure: CORONARY ARTERY BYPASS GRAFTING (CABG);  Surgeon: Loreli Slot, MD;  Location: [facility] OR;  Service: Open Heart Surgery;  Laterality: N/A;  CABG x one, using left greater saphenous vein harvested endoscopically   ICD IMPLANT N/A 05/25/2020   Procedure: ICD IMPLANT;  Surgeon: Hillis Range, MD;  Location: [facility] INVASIVE CV LAB;  Service: Cardiovascular;  Laterality: N/A;   INTRAOPERATIVE TRANSESOPHAGEAL ECHOCARDIOGRAM N/A 01/28/2013   Procedure: INTRAOPERATIVE TRANSESOPHAGEAL ECHOCARDIOGRAM;  Surgeon: Loreli Slot, MD;  Location: [facility] OR;  Service: Open  Heart Surgery;  Laterality: N/A;   LEFT HEART CATH AND CORS/GRAFTS ANGIOGRAPHY N/A 09/13/2020   Procedure: LEFT HEART CATH AND CORS/GRAFTS ANGIOGRAPHY;  Surgeon: Swaziland, Peter M, MD;  Location: [facility] INVASIVE CV LAB;  Service: Cardiovascular;  Laterality: N/A;   LEFT HEART CATHETERIZATION WITH CORONARY ANGIOGRAM N/A 01/06/2013   Procedure: LEFT HEART CATHETERIZATION WITH CORONARY ANGIOGRAM;  Surgeon:  Peter M Swaziland, MD;  Location: [facility] CATH LAB;  Service: Cardiovascular;  Laterality: N/A;   LEFT HEART CATHETERIZATION WITH CORONARY ANGIOGRAM N/A 01/21/2013   Procedure: LEFT HEART CATHETERIZATION WITH CORONARY ANGIOGRAM;  Surgeon: Kathleene Hazel, MD;  Location: [facility] CATH LAB;  Service: Cardiovascular;  Laterality: N/A;   LUMBAR LAMINECTOMY/ DECOMPRESSION WITH MET-RX Right 06/30/2018   Procedure: Right minimally invasive Lumbar Three-Four Far lateral discectomy;  Surgeon: Jadene Pierini, MD;  Location: [facility] OR;  Service: Neurosurgery;  Laterality: Right;  Right minimally invasive Lumbar Three-Four Far lateral discectomy   MASS EXCISION Right 07/21/2014   Procedure: EXCISION RIGHT NECK MASS;  Surgeon: Harriette Bouillon, MD;  Location: [facility];  Service: General;  Laterality: Right;   PERICARDIAL TAP N/A 01/24/2013   Procedure: PERICARDIAL TAP;  Surgeon: Micheline Chapman, MD;  Location: [facility] CATH LAB;  Service: Cardiovascular;  Laterality: N/A;   RIGHT HEART CATHETERIZATION Right 01/24/2013   Procedure: RIGHT HEART CATH;  Surgeon: Micheline Chapman, MD;  Location: [facility] CATH LAB;  Service: Cardiovascular;  Laterality: Right;   VENTRICULAR ANEURYSM RESECTION N/A 01/28/2013   Procedure: LEFT VENTRICULAR ANEURYSM REPAIR;  Surgeon: Loreli Slot, MD;  Location: [facility] OR;  Service: Open Heart Surgery;  Laterality: N/A;    Allergies  No Known Allergies   Labs/Other Studies Reviewed    The following studies were reviewed today: Echo 09/11/2020:  1. Left ventricular ejection fraction, by estimation, is 25 to 30%. The  left ventricle has severely decreased function. The left ventricle  demonstrates regional wall motion abnormalities (see scoring  diagram/findings for description). Left ventricular  diastolic parameters are consistent with Grade II diastolic dysfunction  (pseudonormalization). Elevated left ventricular end-diastolic pressure.  There is of the left ventricular, .  There is of the left ventricular,.   2. Right ventricular systolic function was not well visualized. The right  ventricular size is normal. Tricuspid regurgitation signal is inadequate  for assessing PA pressure.   3. The mitral valve is grossly normal. Trivial mitral valve  regurgitation.   4. The aortic valve is tricuspid. Aortic valve regurgitation is not  visualized. No aortic stenosis is present.   5. The inferior vena cava is normal in size with <50% respiratory  variability, suggesting right atrial pressure of 8 mmHg.   Comparison(s): No significant change from prior study.    Cath 09/13/2020: Prox LAD lesion is 40% stenosed. Prox LAD to Mid LAD lesion is 10% stenosed. Prox Cx to Mid Cx lesion is 20% stenosed. Ost Cx to Prox Cx lesion is 50% stenosed. Prox RCA to Mid RCA lesion is 100% stenosed. SVG graft was not visualized due to known occlusion. Origin to Prox Graft lesion is 100% stenosed. LV end diastolic pressure is normal.   1. Single vessel occlusive CAD with CTO of the mid RCA.  2. Patent stents in the mid LAD and LCx.  3. No SVG to OM visualized but is felt to be occluded. 4. Low LVEDP 3 mm Hg   Plan: recommend continued medical management.   Echo 04/2022: IMPRESSIONS    1. Left ventricular ejection fraction, by  estimation, is 35 to 40%. The  left ventricle has moderately decreased function. The left ventricle  demonstrates regional wall motion abnormalities (see scoring  diagram/findings for description).  Inferior/inferolateral akinesis. There is mild left ventricular  hypertrophy. Left ventricular diastolic parameters are consistent with  Grade II diastolic dysfunction (pseudonormalization). Elevated left atrial  pressure.   2. Right ventricular systolic function was not well visualized. The right  ventricular size is not well visualized.   3. The mitral valve is normal in structure. No evidence of mitral valve  regurgitation. No evidence of mitral  stenosis.   4. The aortic valve is tricuspid. Aortic valve regurgitation is not  visualized. No aortic stenosis is present.   5. Agitated saline contrast bubble study was positive with shunting  observed within 3-6 cardiac cycles suggestive of interatrial shunt. Poor  quality images on bubble study, but significant shunting seen   Cardiac event monitor 06/2022:    Normal sinus rhythm with first degree AV block   One 5 beat run of idioventricular rhythm   Otherwise very rare ectopy    Recent Labs: 05/04/2022: BUN 13; Creatinine, Ser 1.00; Hemoglobin 15.0; Platelets 123; Potassium 3.7; Sodium 140  Recent Lipid Panel    Component Value Date/Time   CHOL 129 05/04/2022 0621   CHOL 148 05/25/2021 0835   TRIG 91 05/04/2022 0621   HDL 49 05/04/2022 0621   HDL 56 05/25/2021 0835   CHOLHDL 2.6 05/04/2022 0621   VLDL 18 05/04/2022 0621   LDLCALC 62 05/04/2022 0621   LDLCALC 72 05/25/2021 0835    History of Present Illness    62 year old male with the above past medical history including CAD s/p prior stenting to LCx, CABG x1 in 2014, chronic systolic heart failure, ICM s/p ICD, pericarditis, hypertension, hyperlipidemia, CVA, and prediabetes.   He has a history of significant CAD with prior stenting to left circumflex and RCA in 2007.  Repeat cardiac catheterization in September 2014 revealed totally occluded RCA, 50% in-stent restenosis of the left circumflex stent, medical therapy was recommended.  He was later hospitalized in October 2014 in the setting of large pericardial effusion with tamponade due to ruptured LV pseudoaneurysm.  Pericardial drain was placed.  Prior to discharge he developed strokelike symptoms, hemodynamic instability and required intubation.  He underwent emergent median sternotomy and repair of ruptured inferoseptal posterior LV pseudoaneurysm with CorMatrix patch and CABG x 1 with SVG-OM.  He was readmitted in late 2014 with persistently elevated troponin was felt to  have Dressler syndrome with myopericarditis.  EF at the time was 25%.  EF improved to 30 to 35% in 2017. However, he ultimately underwent ICD implantation in 05/2020 due to persistently low EF despite optimized medical therapy.  Cardiac catheterization in 08/2020 showed chronically occluded proximal to mid RCA, 40% proximal LAD, 50% ostial to proximal left circumflex lesion, no SVG-OM was visualized, this was felt to be occluded, medical therapy was recommended. He was evaluated in the ED on 05/04/2022 in the setting of near syncope.  MRI revealed small CVA.  Neurology was consulted.  Echocardiogram showed EF 35 to 40%.  He was discharged with 30-day event monitor.  He was last seen in the office on 06/04/2022 by Alejandro MullingAndrew Tillery, PA and was doing well from a cardiac standpoint.  Cardiac event monitor was normal, no evidence of atrial fibrillation.  He presents today for follow-up.  Since his last visit he has been stable overall from a cardiac standpoint.  He denies any dizziness, palpitations,  presyncope, syncope.  He does note a mild increase in dyspnea on exertion, denies chest pain, edema, PND, orthopnea, weight gain.  He continues to smoke.  Overall, he reports feeling well.  Home Medications    Current Outpatient Medications  Medication Sig Dispense Refill   ALPHAGAN P 0.1 % SOLN INSTILL 1 DROP INTO BOTH EYES TWICE A DAY (Patient taking differently: Place 1 drop into both eyes in the morning and at bedtime.) 10 mL 0   aspirin 81 MG EC tablet Take 1 tablet (81 mg total) by mouth daily. Restart on 07/05/2018 90 tablet 3   atorvastatin (LIPITOR) 80 MG tablet Take 1 tablet (80 mg total) by mouth daily. 90 tablet 3   carvedilol (COREG) 25 MG tablet Take 0.5 tablets (12.5 mg total) by mouth 2 (two) times daily with a meal. 180 tablet 3   cetirizine (ZYRTEC) 10 MG tablet Take 1 tablet (10 mg total) by mouth daily. 30 tablet 1   Cholecalciferol (VITAMIN D) 125 MCG (5000 UT) CAPS Take 5,000 Units by mouth daily.      clopidogrel (PLAVIX) 75 MG tablet Take 1 tablet (75 mg total) by mouth daily. 90 tablet 1   dapagliflozin propanediol (FARXIGA) 10 MG TABS tablet TAKE 1 TABLET EVERY DAY (Patient taking differently: Take 10 mg by mouth daily.) 90 tablet 3   ENTRESTO 97-103 MG TAKE 1 TABLET TWICE DAILY (Patient taking differently: Take 1 tablet by mouth 2 (two) times daily.) 180 tablet 3   fluticasone (FLONASE) 50 MCG/ACT nasal spray Place 1 spray into both nostrils daily. 16 g 2   furosemide (LASIX) 20 MG tablet Take 1 tablet (20 mg total) by mouth daily. 90 tablet 3   gabapentin (NEURONTIN) 300 MG capsule Take 1 capsule (300 mg total) by mouth at bedtime. 30 capsule 0   latanoprost (XALATAN) 0.005 % ophthalmic solution Place 1 drop into both eyes at bedtime.  12   methocarbamol (ROBAXIN) 500 MG tablet TAKE 1 TABLET (500 MG TOTAL) BY MOUTH 2 (TWO) TIMES DAILY AS NEEDED FOR MUSCLE SPASMS. 30 tablet 2   Multiple Vitamin (MULTIVITAMIN WITH MINERALS) TABS tablet Take 1 tablet by mouth daily. Centrum Silver     nicotine (NICODERM CQ - DOSED IN MG/24 HR) 7 mg/24hr patch Place 1 patch (7 mg total) onto the skin daily. 28 patch 1   nitroGLYCERIN (NITROSTAT) 0.4 MG SL tablet Place 1 tablet (0.4 mg total) under the tongue every 5 (five) minutes x 3 doses as needed for chest pain. 25 tablet 1   spironolactone (ALDACTONE) 25 MG tablet Take 0.5 tablets (12.5 mg total) by mouth daily. 45 tablet 1   No current facility-administered medications for this visit.     Review of Systems    He denies chest pain, palpitations, pnd, orthopnea, n, v, dizziness, syncope, edema, weight gain, or early satiety. All other systems reviewed and are otherwise negative except as noted above.   Physical Exam    VS:  BP (!) 82/58   Pulse 68   Ht 6\' 1"  (1.854 m)   Wt 233 lb (105.7 kg)   BMI 30.74 kg/m   GEN: Well nourished, well developed, in no acute distress. HEENT: normal. Neck: Supple, no JVD, carotid bruits, or masses. Cardiac:  RRR, no murmurs, rubs, or gallops. No clubbing, cyanosis, edema.  Radials/DP/PT 2+ and equal bilaterally.  Respiratory:  Respirations regular and unlabored, clear to auscultation bilaterally. GI: Soft, nontender, nondistended, BS + x 4. MS: no deformity or atrophy. Skin: warm  and dry, no rash. Neuro:  Strength and sensation are intact. Psych: Normal affect.  Accessory Clinical Findings    ECG personally reviewed by me today -no EKG in office today- no acute changes.   Lab Results  Component Value Date   WBC 6.0 05/04/2022   HGB 15.0 05/04/2022   HCT 44.0 05/04/2022   MCV 85.8 05/04/2022   PLT 123 (L) 05/04/2022   Lab Results  Component Value Date   CREATININE 1.00 05/04/2022   BUN 13 05/04/2022   NA 140 05/04/2022   K 3.7 05/04/2022   CL 103 05/04/2022   CO2 25 05/04/2022   Lab Results  Component Value Date   ALT 15 05/25/2021   AST 17 05/25/2021   ALKPHOS 147 (H) 05/25/2021   BILITOT 0.4 05/25/2021   Lab Results  Component Value Date   CHOL 129 05/04/2022   HDL 49 05/04/2022   LDLCALC 62 05/04/2022   TRIG 91 05/04/2022   CHOLHDL 2.6 05/04/2022    Lab Results  Component Value Date   HGBA1C 6.0 (H) 05/04/2022    Assessment & Plan   1. CAD/dyspnea on exertion: S/p prior stenting-LCx, CABG in 2014. He notes recent mild dyspnea on exertion. No chest pain. He does have underlying COPD, allergies.  Recent echo stable.  Continue to monitor symptoms. If symptoms become more frequent or persistent, could consider cardiac PET stress test.  Continue aspirin, Plavix, carvedilol, Entresto, Lasix, Farxiga, spironolactone, and Lipitor.  2. Chronic systolic heart failure: Most recent echo in 04/2022 showed EF 35-40%. S/p ICD. Euvolemic and well compensated on exam. Continue current medications as above.   3. Near syncope/history of CVA: Recurrent stroke in 04/2022. Denies any further presyncope, syncope.  Echo was stable, cardiac monitor was unrevealing.  Following with  neurology.  Continue aspirin, Plavix, Lipitor.  4. Hypertension: BP low in office today. He is asymptomatic. BP runs low generally.  Advised him to continue to monitor BP and report SBP consistently less than 100.  He declines this to his medication regimen today. Continue current medications as above.   5. Hyperlipidemia: LDL was 62 in 04/2022. Continue Lipitor.   6. Prediabetes: A1c was 6.0 in 04/2022. Monitored and managed per PCP.   7. Tobacco use/COPD: He continues to smoke though he has cut back significantly. Full cessation advised.   8. History of snoring: Pending sleep study per neurology.   9. Disposition: Follow-up as scheduled with Dr. Elberta Fortis in 11/2022, follow-up in 6 months with Dr. Swaziland.       Joylene Grapes, NP 07/23/2022, 12:14 PM

## 2022-07-30 ENCOUNTER — Ambulatory Visit: Payer: Medicare HMO | Attending: Cardiology

## 2022-07-30 DIAGNOSIS — I5022 Chronic systolic (congestive) heart failure: Secondary | ICD-10-CM

## 2022-07-30 DIAGNOSIS — Z95810 Presence of automatic (implantable) cardiac defibrillator: Secondary | ICD-10-CM | POA: Diagnosis not present

## 2022-08-01 NOTE — Progress Notes (Signed)
EPIC Encounter for ICM Monitoring  Patient Name: [name] is a 62 y.o. male Date: 08/01/2022 Primary Care Physican: [name], MD Primary Cardiologist: Swaziland Electrophysiologist: Elberta Fortis 02/27/2022 Weight: 232 lbs  06/26/2022 Weight: 232 lbs                                                  Spoke with patient and heart failure questions reviewed.  Transmission results reviewed.  Pt asymptomatic for fluid accumulation.  Reports feeling well at this time and voices no complaints.  Pt reports he was instructed at 4/8 OV to hold Furosemide due to low BP.    CorVue thoracic impedance normal but was suggesting possible fluid accumulation from 4/5-4/12.    Prescribed: Furosemide 20 mg take 1 tablet by mouth daily. Spironolactone 25 mg take 1 tablet daily   Labs: 04/24/2022 Creatinine 1.13, BUN 13, Potassium 3.7, Sodium 139,  GFR >60 A complete set of results can be found in Results Review.   Recommendations:  No changes and encouraged to call if experiencing any fluid symptoms.   Follow-up plan: ICM clinic phone appointment on 08/13/2022 to recheck fluid levels since stopping Furosemide.   91 day device clinic remote transmission 08/22/2022.     EP/Cardiology Office Visits:  Recall 10/27/2022 with Dr Swaziland.  Recall 12/03/2022 with Dr Elberta Fortis.   Copy of ICM check sent to Dr. Elberta Fortis.  3 month ICM trend: 07/30/2022.    12-14 Month ICM trend:     Karie Soda, RN 08/01/2022 10:28 AM

## 2022-08-06 ENCOUNTER — Ambulatory Visit (INDEPENDENT_AMBULATORY_CARE_PROVIDER_SITE_OTHER): Payer: Medicare HMO | Admitting: Neurology

## 2022-08-06 ENCOUNTER — Encounter: Payer: Self-pay | Admitting: Neurology

## 2022-08-06 VITALS — BP 86/57 | HR 63 | Ht 72.0 in | Wt 234.0 lb

## 2022-08-06 DIAGNOSIS — R2689 Other abnormalities of gait and mobility: Secondary | ICD-10-CM

## 2022-08-06 DIAGNOSIS — Z9189 Other specified personal risk factors, not elsewhere classified: Secondary | ICD-10-CM | POA: Diagnosis not present

## 2022-08-06 DIAGNOSIS — F17200 Nicotine dependence, unspecified, uncomplicated: Secondary | ICD-10-CM | POA: Diagnosis not present

## 2022-08-06 DIAGNOSIS — I639 Cerebral infarction, unspecified: Secondary | ICD-10-CM | POA: Diagnosis not present

## 2022-08-06 NOTE — Progress Notes (Signed)
SLEEP MEDICINE CLINIC    Provider:  Melvyn Novas, MD  Primary Care Physician:  [name], MD [address] [place] [place] [address]     Referring Provider:   Micki Riley, Md [address] [place],  [place] [address]          Chief Complaint according to patient   Patient presents with:     New Patient (Initial Visit)           HISTORY OF PRESENT ILLNESS:  [name] is a 62 y.o. male patient who is seen upon referral on 08/06/2022 from Dr Pearlean Brownie for a Sleep evaluation to help find a cause for his stroke  .  Chief concern according to patient :  " I used to drive trucks, and I don't anymore, I have  refreshing sleep, but I smoke and I snore"     DAMON HARGROVE is seen on 08/06/22:    The patient never had a sleep study.Sleep relevant medical history: Nocturia once, no Tonsillectomy, no ENT surgery was 2.5 ppd smoker , now smokes only 15 cig. a day    Family medical /sleep history: No Known family member with OSA.  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  and lives in a household alone. Family status is divorced , with 2  adult children, 3 grandchildren.  Tobacco use- yes .  ETOH use ; none,  Caffeine intake in form of Coffee( 1 cup) Soda( /) Tea ( /) no energy drinks Exercise in form of  walking.       Sleep habits are as follows: The patient's dinner time is between 5-6 PM. The patient goes to bed at 11-12 PM and continues to sleep for 6-7 hours.   The preferred sleep position is right lateral, with the support of 2 pillows. Bed is flat, bedroom is cool, quiet and dark.  Dreams are reportedly rare.   The patient wakes up with an alarm. 7.30  AM is the usual rise time to get grandson into preschool. Marland Kitchen He reports feeling refreshed or restored in AM, with symptoms such as dry mouth, cough,  but no morning headaches, and residual fatigue.  Naps are taken frequently,  lasting  from 20- to 35 minutes and are more refreshing than nocturnal sleep.    hospital consultation for stroke in January 2024.  History is obtained from the patient and review of electronic medical records.  I personally reviewed pertinent available imaging films in PACS.  He has past medical history of bilateral cerebellar strokes, coronary artery disease, status post MI and stent, hyperlipidemia, smoking and using marijuana.  He presented on 05/04/2022 with sudden onset of nausea, disequilibrium and right hand incoordination he was unable to play the button on his phone with his right hand.  Symptoms resolved by the time of this imaging alone.  NIH stroke scale was 0.  CT head was unremarkable and CT angiogram showed no large vessel stenosis or occlusion.  MRI scan showed right inferior cerebellar small infarct.  2D echo showed ejection fraction of 35 to 40% with inferior lateral appendixes with mild LVH and grade 2 diastolic dysfunction.  Positive right to left shunting was noted with bubble injection but interatrial septum was not visualized.  LDL cholesterol 62 mg percent.  Hemoglobin A1c was 6.0.  Patient was counseled to quit smoking cigarettes and marijuana and switch from aspirin and  Plavix to aspirin and Brilinta for 4 weeks followed by aspirin Plavix due to affordability of Brilinta long-term..  Patient states is done well since discharge.  He is right-handed coordination has improved.  He is not had any dizzy spells.  He has cut back smoking significantly and smokes about half pack per day.  He also uses marijuana very occasionally.  He had a 30-day heart monitor as an outpatient which showed no evidence of fibrillation.  Patient has never been evaluated for sleep apnea but appears to be at risk.  He is tolerating Lipitor well without muscle aches and pains.  His blood pressure is well-controlled today it is 110/75.  He remains on aspirin and Plavix and is only minor bruising and no bleeding.    Review of  Systems: Out of a complete 14 system review, the patient complains of only the following symptoms, and all other reviewed systems are negative.:  Fatigue, sleepiness , snoring, fragmented sleep, Insomnia, no RLS but right leg radiculopathy, L4-L5    How likely are you to doze in the following situations: 0 = not likely, 1 = slight chance, 2 = moderate chance, 3 = high chance   Sitting and Reading? Watching Television? Sitting inactive in a public place (theater or meeting)? As a passenger in a car for an hour without a break? Lying down in the afternoon when circumstances permit? Sitting and talking to someone? Sitting quietly after lunch without alcohol? In a car, while stopped for a few minutes in traffic?   Total = 2/ 24 points   FSS endorsed at 13/ 63 points.    Tinnitus .  Social History   Socioeconomic History   Marital status: Single    Spouse name: Not on file   Number of children: 2   Years of education: 12th   Highest education level: Not on file  Occupational History   Occupation: N/A  Tobacco Use   Smoking status: Every Day    Packs/day: 1.00    Years: 35.00    Additional pack years: 0.00    Total pack years: 35.00    Types: Cigarettes   Smokeless tobacco: Never  Vaping Use   Vaping Use: Never used  Substance and Sexual Activity   Alcohol use: No    Alcohol/week: 0.0 standard drinks of alcohol   Drug use: No   Sexual activity: Yes  Other Topics Concern   Not on file  Social History Narrative   Lives in [place] with wife.  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  He is now retired on disability.  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      Caffeine Use: very rarely   Social Determinants of Corporate investment banker Strain: Not on file  Food Insecurity: Not on file  Transportation Needs: Not on file  Physical Activity: Not on file  Stress: Not on file  Social Connections: Not on file    Family History  Problem Relation Age of  Onset   Heart attack Mother    Diabetes Mother    Hypertension Mother    Hypertension Father    CAD Other    HIV Brother    Stroke Neg Hx     Past Medical History:  Diagnosis Date   Abnormal EKG    hx of ischemia showing up on ekg's   Acute myopericarditis    a. 03/2013: readm with hypoxia, tachycardia, elevated ESR/CRP, elevated troponin with Dressler syndrome and  myopericarditis; treated with steroids.   Acute respiratory failure    a. 01/2013: VDRF. b. 03/2013: hypoxia requiring supp O2 during adm, resolved by discharge.   C. difficile colitis    a. 01/2013 during prolonged adm. b. Recurred 03/2013.   Cardiac tamponade    a. 01/2013 s/p drain.   Cataracts, bilateral    Coronary artery disease    a. s/p MI in 2009 in [place] with stenting of the LCX and LAD;  b. 12/2012 NSTEMI/CAD: LM nl, LAD patent prox stent, LCX 50-70 isr (FFR 0.84), RCA dom, 173m, EF 40-45%-->Med Rx. c. 01/2013: anterolateral STEMI complicated by pericardial effusion (presumed purulent pericarditis) and tamponade s/p drain, ruptured LV pseudoaneurysm s/p CorMatrix patch, CABGx1 (SVG-OM1), fever, CVA, VDRF, C diff.   CVA (cerebral infarction)    a. 01/2013 in setting of prolonged hospitalization - residual L arm weakness.   Dressler syndrome    a. 03/2013: readm with hypoxia, tachycardia, elevated ESR/CRP, elevated troponin with Dressler syndrome and myopericarditis; treated with steroids.   Glaucoma    Hyperlipidemia    Hypokalemia    Hyponatremia    a. During late 2014.   Ischemic cardiomyopathy    a. Sept/Oct 2014: EF ~40% (ICM). b. 03/2013: EF 25-30%. Off ACEI due to hypotension (MIXED NICM/ICM).   Optic neuropathy, ischemic    Pericardial effusion    a. 01/2013: pericardial effusion (presumed purulent pericarditis) and cardiac tamponade s/p drain. b. Persistent moderate pericardial effusion 03/2013.   Pre-diabetes    Pseudoaneurysm of left ventricle of heart    a. 01/2013:  Ruptured inferoposterior LV  pseudoaneurysm s/p CorMatrix patch.   Sinus bradycardia    asymptomatic   Stroke 2009   weak lt side-lt arm   Tobacco abuse    Wears dentures    top    Past Surgical History:  Procedure Laterality Date   CARDIAC CATHETERIZATION  01/06/2013   CATARACT EXTRACTION Bilateral 05/2021   COLONOSCOPY WITH PROPOFOL N/A 07/03/2016   Procedure: COLONOSCOPY WITH PROPOFOL;  Surgeon: Ruffin Frederick, MD;  Location: [facility];  Service: Gastroenterology;  Laterality: N/A;   CORONARY ANGIOPLASTY WITH STENT PLACEMENT  2000's X 2   "1 + 1" (01/06/2013)   CORONARY ARTERY BYPASS GRAFT N/A 01/28/2013   Procedure: CORONARY ARTERY BYPASS GRAFTING (CABG);  Surgeon: Loreli Slot, MD;  Location: [facility] OR;  Service: Open Heart Surgery;  Laterality: N/A;  CABG x one, using left greater saphenous vein harvested endoscopically   ICD IMPLANT N/A 05/25/2020   Procedure: ICD IMPLANT;  Surgeon: Hillis Range, MD;  Location: [facility] INVASIVE CV LAB;  Service: Cardiovascular;  Laterality: N/A;   INTRAOPERATIVE TRANSESOPHAGEAL ECHOCARDIOGRAM N/A 01/28/2013   Procedure: INTRAOPERATIVE TRANSESOPHAGEAL ECHOCARDIOGRAM;  Surgeon: Loreli Slot, MD;  Location: [facility] OR;  Service: Open Heart Surgery;  Laterality: N/A;   LEFT HEART CATH AND CORS/GRAFTS ANGIOGRAPHY N/A 09/13/2020   Procedure: LEFT HEART CATH AND CORS/GRAFTS ANGIOGRAPHY;  Surgeon: Swaziland, Peter M, MD;  Location: [facility] INVASIVE CV LAB;  Service: Cardiovascular;  Laterality: N/A;   LEFT HEART CATHETERIZATION WITH CORONARY ANGIOGRAM N/A 01/06/2013   Procedure: LEFT HEART CATHETERIZATION WITH CORONARY ANGIOGRAM;  Surgeon: Peter M Swaziland, MD;  Location: [facility] CATH LAB;  Service: Cardiovascular;  Laterality: N/A;   LEFT HEART CATHETERIZATION WITH CORONARY ANGIOGRAM N/A 01/21/2013   Procedure: LEFT HEART CATHETERIZATION WITH CORONARY ANGIOGRAM;  Surgeon: Kathleene Hazel, MD;  Location: [facility] CATH LAB;  Service: Cardiovascular;  Laterality: N/A;   LUMBAR  LAMINECTOMY/ DECOMPRESSION WITH MET-RX Right 06/30/2018  Procedure: Right minimally invasive Lumbar Three-Four Far lateral discectomy;  Surgeon: Jadene Pierini, MD;  Location: [facility] OR;  Service: Neurosurgery;  Laterality: Right;  Right minimally invasive Lumbar Three-Four Far lateral discectomy   MASS EXCISION Right 07/21/2014   Procedure: EXCISION RIGHT NECK MASS;  Surgeon: Harriette Bouillon, MD;  Location: [facility];  Service: General;  Laterality: Right;   PERICARDIAL TAP N/A 01/24/2013   Procedure: PERICARDIAL TAP;  Surgeon: Micheline Chapman, MD;  Location: [facility] CATH LAB;  Service: Cardiovascular;  Laterality: N/A;   RIGHT HEART CATHETERIZATION Right 01/24/2013   Procedure: RIGHT HEART CATH;  Surgeon: Micheline Chapman, MD;  Location: [facility] CATH LAB;  Service: Cardiovascular;  Laterality: Right;   VENTRICULAR ANEURYSM RESECTION N/A 01/28/2013   Procedure: LEFT VENTRICULAR ANEURYSM REPAIR;  Surgeon: Loreli Slot, MD;  Location: [facility] OR;  Service: Open Heart Surgery;  Laterality: N/A;     Current Outpatient Medications on File Prior to Visit  Medication Sig Dispense Refill   ALPHAGAN P 0.1 % SOLN INSTILL 1 DROP INTO BOTH EYES TWICE A DAY (Patient taking differently: Place 1 drop into both eyes in the morning and at bedtime.) 10 mL 0   aspirin 81 MG EC tablet Take 1 tablet (81 mg total) by mouth daily. Restart on 07/05/2018 90 tablet 3   atorvastatin (LIPITOR) 80 MG tablet Take 1 tablet (80 mg total) by mouth daily. 90 tablet 3   carvedilol (COREG) 25 MG tablet Take 0.5 tablets (12.5 mg total) by mouth 2 (two) times daily with a meal. 180 tablet 3   cetirizine (ZYRTEC) 10 MG tablet Take 1 tablet (10 mg total) by mouth daily. 30 tablet 1   Cholecalciferol (VITAMIN D) 125 MCG (5000 UT) CAPS Take 5,000 Units by mouth daily.     clopidogrel (PLAVIX) 75 MG tablet Take 1 tablet (75 mg total) by mouth daily. 90 tablet 1   dapagliflozin propanediol (FARXIGA) 10 MG TABS tablet TAKE 1  TABLET EVERY DAY (Patient taking differently: Take 10 mg by mouth daily.) 90 tablet 3   ENTRESTO 97-103 MG TAKE 1 TABLET TWICE DAILY (Patient taking differently: Take 1 tablet by mouth 2 (two) times daily.) 180 tablet 3   fluticasone (FLONASE) 50 MCG/ACT nasal spray Place 1 spray into both nostrils daily. 16 g 2   furosemide (LASIX) 20 MG tablet Take 1 tablet (20 mg total) by mouth daily. 90 tablet 3   gabapentin (NEURONTIN) 300 MG capsule Take 1 capsule (300 mg total) by mouth at bedtime. 30 capsule 0   latanoprost (XALATAN) 0.005 % ophthalmic solution Place 1 drop into both eyes at bedtime.  12   methocarbamol (ROBAXIN) 500 MG tablet TAKE 1 TABLET (500 MG TOTAL) BY MOUTH 2 (TWO) TIMES DAILY AS NEEDED FOR MUSCLE SPASMS. 30 tablet 2   Multiple Vitamin (MULTIVITAMIN WITH MINERALS) TABS tablet Take 1 tablet by mouth daily. Centrum Silver     nicotine (NICODERM CQ - DOSED IN MG/24 HR) 7 mg/24hr patch Place 1 patch (7 mg total) onto the skin daily. 28 patch 1   nitroGLYCERIN (NITROSTAT) 0.4 MG SL tablet Place 1 tablet (0.4 mg total) under the tongue every 5 (five) minutes x 3 doses as needed for chest pain. 25 tablet 1   spironolactone (ALDACTONE) 25 MG tablet Take 0.5 tablets (12.5 mg total) by mouth daily. 45 tablet 1   No current facility-administered medications on file prior to visit.    No Known Allergies   DIAGNOSTIC DATA (LABS, IMAGING,  TESTING) - I reviewed patient records, labs, notes, testing and imaging myself where available.  Lab Results  Component Value Date   WBC 6.0 05/04/2022   HGB 15.0 05/04/2022   HCT 44.0 05/04/2022   MCV 85.8 05/04/2022   PLT 123 (L) 05/04/2022      Component Value Date/Time   NA 140 05/04/2022 0242   NA 139 05/25/2021 0835   K 3.7 05/04/2022 0242   CL 103 05/04/2022 0242   CO2 25 05/04/2022 0130   GLUCOSE 131 (H) 05/04/2022 0242   BUN 13 05/04/2022 0242   BUN 12 05/25/2021 0835   CREATININE 1.00 05/04/2022 0242   CREATININE 0.90 11/03/2015  1609   CALCIUM 9.5 05/04/2022 0130   PROT 6.8 05/25/2021 0835   ALBUMIN 4.4 05/25/2021 0835   AST 17 05/25/2021 0835   ALT 15 05/25/2021 0835   ALKPHOS 147 (H) 05/25/2021 0835   BILITOT 0.4 05/25/2021 0835   GFRNONAA >60 05/04/2022 0130   GFRNONAA >89 11/03/2015 1609   GFRAA 92 05/06/2020 1342   GFRAA >89 11/03/2015 1609   Lab Results  Component Value Date   CHOL 129 05/04/2022   HDL 49 05/04/2022   LDLCALC 62 05/04/2022   TRIG 91 05/04/2022   CHOLHDL 2.6 05/04/2022   Lab Results  Component Value Date   HGBA1C 6.0 (H) 05/04/2022   No results found for: "VITAMINB12" Lab Results  Component Value Date   TSH 0.604 01/05/2013    PHYSICAL EXAM:  Today's Vitals   08/06/22 0952  BP: (!) 86/57  Pulse: 63  Weight: 234 lb (106.1 kg)  Height: 6' (1.829 m)   Body mass index is 31.74 kg/m.   Wt Readings from Last 3 Encounters:  08/06/22 234 lb (106.1 kg)  07/23/22 233 lb (105.7 kg)  07/02/22 231 lb 6.4 oz (105 kg)     Ht Readings from Last 3 Encounters:  08/06/22 6' (1.829 m)  07/23/22 6\' 1"  (1.854 m)  07/02/22 6\' 1"  (1.854 m)      General: The patient is awake, alert and appears not in acute distress. The patient is well groomed. Head: Normocephalic, atraumatic.  Neck is supple. Mallampati 3,  neck circumference:18 inches .  Nasal airflow  patent.  Retrognathia is  seen.  Dental status: all dentures-  Cardiovascular:  Regular rate and cardiac rhythm by pulse,  without distended neck veins. Respiratory: Lungs are clear to auscultation.  Skin:  Without evidence of ankle edema, or rash. Trunk: The patient's posture is erect.   NEUROLOGIC EXAM: The patient is awake and alert, oriented to place and time.   Memory subjective described as intact.  Attention span & concentration ability appears normal.  Speech is fluent,  without  dysarthria, dysphonia or aphasia.  Mood and affect are appropriate.   Cranial nerves: no loss of smell or taste reported  Pupils are  equal in size and round, arcus senilis. and briskly reactive to light. Funduscopic exam deferred.  Extraocular movements in vertical and horizontal planes were intact and without nystagmus.  No Diplopia. Visual fields by finger perimetry are intact. Hearing was intact to soft voice .    Facial sensation intact to fine touch.  Facial motor strength is symmetric and tongue and uvula move midline.  Neck ROM : rotation, tilt and flexion extension were normal for age and shoulder shrug was symmetrical.    Motor exam:  Symmetric bulk, tone and ROM.   Normal tone without cog wheeling, symmetric grip strength .  Sensory:  Fine touch, and vibration were tested  and  normal.  Proprioception tested in the upper extremities was normal.   Coordination: Rapid alternating movements in the fingers/hands were of normal speed.  The Finger-to-nose maneuver was intact without evidence of ataxia, dysmetria there is a mild intention/ action tremor on the right.    Gait and station: Patient could rise unassisted from a seated position, walked without assistive device.  Stance is of normal width/ base   Unable to heel, toe and tandem walk  .  Deep tendon reflexes: in the  upper and lower extremities are symmetric and patella is trace only- Babinski response was deferred    ASSESSMENT AND PLAN 62 year old African-American male with right cerebellar infarct and January 2024 of cryptogenic etiology. Vascular risk factors of coronary artery disease, cardiomyopathy, hyperlipidemia, hypertension, diabetes, at risk for sleep apnea, smoking cigarettes and marijuana.     1) Cryptogenic cerebellar stroke ( January 2024) see Dr Marlis Edelson summarized history-He is at risk for OSA  by BMI , neck and airway anatomy. Also uses dentures, full sets.    2) He has a long history of heavy smoking. Of pericarditis and  tamponade.   3) He is reporting life long snoring , but unaware of apnea.   I will order a HST as he is  caretaker of his 33 year-old grandson , would prefer HST over in lab testing. I also spoke to him about smoking cessation. Healthy eating, hydration.     I plan to follow up either personally or through our NP within 3-5 months.   I would like to thank  Micki Riley, Md [address] [place],  [place] [address] for allowing me to meet with and to take care of this pleasant patient.     After spending a total time of  45  minutes face to face and additional time for physical and neurologic examination, review of laboratory studies,  personal review of imaging studies, reports and results of other testing and review of referral information / records as far as provided in visit,   Electronically signed by: Melvyn Novas, MD 08/06/2022 10:40 AM  [facility] Board certified by The [employer] of Sleep Medicine and Diplomate of the [employer] of Sleep Medicine. Board certified In Neurology through the ABPN, Fellow of the [employer] of Neurology. Medical Director of [facility].

## 2022-08-13 ENCOUNTER — Ambulatory Visit: Payer: Medicare HMO | Attending: Cardiology

## 2022-08-13 DIAGNOSIS — I5022 Chronic systolic (congestive) heart failure: Secondary | ICD-10-CM

## 2022-08-13 DIAGNOSIS — Z95810 Presence of automatic (implantable) cardiac defibrillator: Secondary | ICD-10-CM

## 2022-08-14 NOTE — Progress Notes (Signed)
EPIC Encounter for ICM Monitoring  Patient Name: [name] is a 62 y.o. male Date: 08/14/2022 Primary Care Physican: [name], MD Primary Cardiologist: [place] Electrophysiologist: Elberta Fortis 02/27/2022 Weight: 232 lbs  06/26/2022 Weight: 232 lbs                                                   Spoke with patient and heart failure questions reviewed.  Transmission results reviewed.  Pt asymptomatic for fluid accumulation.  Reports feeling well at this time and voices no complaints.  Resumed taking daily Furosemide on 4/29 after being on hold for low BP.   CorVue thoracic impedance normal but was suggesting possible fluid accumulation from 4/5-4/12 and 4/15-4/23.    Prescribed: Furosemide 20 mg take 1 tablet by mouth daily. Spironolactone 25 mg take 1 tablet daily   Labs: 04/24/2022 Creatinine 1.13, BUN 13, Potassium 3.7, Sodium 139,  GFR >60 A complete set of results can be found in Results Review.   Recommendations:  Recommendation to limit salt intake to 2000 mg daily and fluid intake to 64 oz daily.  Encouraged to call if experiencing any fluid symptoms.    Follow-up plan: ICM clinic phone appointment on 09/03/2022.   91 day device clinic remote transmission 08/22/2022.     EP/Cardiology Office Visits:  Recall 10/27/2022 with Dr Swaziland.  Recall 12/03/2022 with Dr Elberta Fortis.   Copy of ICM check sent to Dr. Elberta Fortis.  3 month ICM trend: 08/13/2022.    12-14 Month ICM trend:     Karie Soda, RN 08/14/2022 1:51 PM

## 2022-08-22 ENCOUNTER — Telehealth: Payer: Self-pay | Admitting: Neurology

## 2022-08-22 ENCOUNTER — Ambulatory Visit (INDEPENDENT_AMBULATORY_CARE_PROVIDER_SITE_OTHER): Payer: Medicare HMO

## 2022-08-22 DIAGNOSIS — I255 Ischemic cardiomyopathy: Secondary | ICD-10-CM

## 2022-08-22 NOTE — Telephone Encounter (Signed)
08/22/22 left VM [place] 08/14/22 [employer]/medicaid no auth req EE

## 2022-08-23 LAB — CUP PACEART REMOTE DEVICE CHECK
Battery Remaining Longevity: 91 mo
Battery Remaining Percentage: 79 %
Battery Voltage: 2.99 V
Brady Statistic RV Percent Paced: 1 %
Date Time Interrogation Session: 20240508020412
HighPow Impedance: 59 Ohm
Implantable Lead Connection Status: 753985
Implantable Lead Implant Date: 20220209
Implantable Lead Location: 753860
Implantable Pulse Generator Implant Date: 20220209
Lead Channel Impedance Value: 380 Ohm
Lead Channel Pacing Threshold Amplitude: 3 V
Lead Channel Pacing Threshold Pulse Width: 0.5 ms
Lead Channel Sensing Intrinsic Amplitude: 5.7 mV
Lead Channel Setting Pacing Amplitude: 3.25 V
Lead Channel Setting Pacing Pulse Width: 0.5 ms
Lead Channel Setting Sensing Sensitivity: 0.5 mV
Pulse Gen Serial Number: 111033765
Zone Setting Status: 755011

## 2022-08-27 ENCOUNTER — Ambulatory Visit: Payer: Medicare HMO | Admitting: Student

## 2022-09-03 ENCOUNTER — Ambulatory Visit: Payer: Medicare PPO | Attending: Cardiology

## 2022-09-03 DIAGNOSIS — I5022 Chronic systolic (congestive) heart failure: Secondary | ICD-10-CM | POA: Diagnosis not present

## 2022-09-03 DIAGNOSIS — Z95810 Presence of automatic (implantable) cardiac defibrillator: Secondary | ICD-10-CM

## 2022-09-05 NOTE — Progress Notes (Signed)
EPIC Encounter for ICM Monitoring  Patient Name: [name] is a 62 y.o. male Date: 09/05/2022 Primary Care Physican: [name], MD Primary Cardiologist: [place] Electrophysiologist: Elberta Fortis 02/27/2022 Weight: 232 lbs  06/26/2022 Weight: 232 lbs   09/05/2022 Weight: 232 lbs                                                 Spoke with patient and heart failure questions reviewed.  Transmission results reviewed.  Pt asymptomatic for fluid accumulation.  Reports feeling well at this time and voices no complaints.   Brother and aunt passed away on mothers day.    CorVue thoracic impedance normal but was suggesting possible fluid accumulation from 5/4-5/15.     Prescribed: Furosemide 20 mg take 1 tablet by mouth daily. Spironolactone 25 mg take 1 tablet daily   Labs: 04/24/2022 Creatinine 1.13, BUN 13, Potassium 3.7, Sodium 139,  GFR >60 A complete set of results can be found in Results Review.   Recommendations:   Encouraged to call if experiencing any fluid symptoms.    Follow-up plan: ICM clinic phone appointment on 10/08/2022.   91 day device clinic remote transmission 11/21/2022.     EP/Cardiology Office Visits:  Recall 10/27/2022 with Dr Swaziland.  Recall 12/03/2022 with Dr Elberta Fortis.   Copy of ICM check sent to Dr. Elberta Fortis.   3 month ICM trend: 09/03/2022.    12-14 Month ICM trend:     Karie Soda, RN 09/05/2022 2:54 PM

## 2022-09-12 NOTE — Progress Notes (Signed)
Remote ICD transmission.   

## 2022-09-17 ENCOUNTER — Telehealth: Payer: Self-pay | Admitting: Cardiology

## 2022-09-17 NOTE — Telephone Encounter (Signed)
Patient calling the office for samples of medication:   1.  What medication and dosage are you requesting samples for?   ENTRESTO 97-103 MG    2.  Are you currently out of this medication? Yes  [place] from pt's PCP office called in stating pt has lost his insurance and unable to afford refill at this time. She is requesting samples for pt and would like for someone to assist him with a patient assistance application.

## 2022-09-17 NOTE — Telephone Encounter (Signed)
Returned call. Spoke with [employer]. She was not able to get [place] or a member of the care team on the phone she will give them the message. They will call back if they have any further questions.

## 2022-09-18 ENCOUNTER — Telehealth: Payer: Self-pay | Admitting: Cardiology

## 2022-09-18 NOTE — Telephone Encounter (Signed)
Patient states he lost his medicaid and he is out of entresto. He was calling for samples but informed him they do not make samples I'm the strength he takes. He would also like help applying for patient assistance.

## 2022-09-18 NOTE — Telephone Encounter (Signed)
Pt PCP called back about this matter. Informed her pt reached out today about samples and patient assistance, message was routed over to pharmD and they will f/u from there

## 2022-09-18 NOTE — Telephone Encounter (Signed)
  Pt is request for Dr. Elvis Coil nurse to call him back

## 2022-09-19 ENCOUNTER — Telehealth: Payer: Self-pay | Admitting: Cardiology

## 2022-09-19 NOTE — Telephone Encounter (Signed)
Pt c/o medication issue:  1. Name of Medication:   ENTRESTO 97-103 MG    2. How are you currently taking this medication (dosage and times per day)?   TAKE 1 TABLET TWICE DAILYPatient taking differently: Take 1 tablet by mouth 2 (two) times daily.    3. Are you having a reaction (difficulty breathing--STAT)? No  4. What is your medication issue? Pt would like a callback regarding getting Pt. Assistance for above medication. Please advise

## 2022-09-19 NOTE — Telephone Encounter (Signed)
Left voicemail for patient to return call.  Patient called about help filling out patient assistance forms yesterday. So its multiple encounters

## 2022-09-24 NOTE — Telephone Encounter (Signed)
Already spoke to patient.See 6/10 telephone note.

## 2022-09-24 NOTE — Telephone Encounter (Signed)
Spoke to patient he stated he no longer has Medicaid.He was able to fill a 30 day supply of Entresto.I will mail him a patient assistance form to complete and bring back along with proof of income.

## 2022-10-02 ENCOUNTER — Inpatient Hospital Stay (HOSPITAL_COMMUNITY): Payer: Medicare PPO

## 2022-10-02 ENCOUNTER — Encounter (HOSPITAL_COMMUNITY): Payer: Self-pay | Admitting: Neurology

## 2022-10-02 ENCOUNTER — Emergency Department (HOSPITAL_COMMUNITY): Payer: Medicare PPO

## 2022-10-02 ENCOUNTER — Other Ambulatory Visit: Payer: Self-pay

## 2022-10-02 ENCOUNTER — Inpatient Hospital Stay (HOSPITAL_COMMUNITY)
Admission: EM | Admit: 2022-10-02 | Discharge: 2022-11-16 | DRG: 023 | Disposition: A | Discharge status: Skilled Nursing Facility | Payer: Medicare PPO | Attending: Internal Medicine | Admitting: Internal Medicine

## 2022-10-02 DIAGNOSIS — I63011 Cerebral infarction due to thrombosis of right vertebral artery: Secondary | ICD-10-CM | POA: Diagnosis not present

## 2022-10-02 DIAGNOSIS — Z794 Long term (current) use of insulin: Secondary | ICD-10-CM

## 2022-10-02 DIAGNOSIS — E669 Obesity, unspecified: Secondary | ICD-10-CM | POA: Diagnosis present

## 2022-10-02 DIAGNOSIS — I428 Other cardiomyopathies: Secondary | ICD-10-CM | POA: Diagnosis present

## 2022-10-02 DIAGNOSIS — I5021 Acute systolic (congestive) heart failure: Secondary | ICD-10-CM | POA: Diagnosis not present

## 2022-10-02 DIAGNOSIS — R482 Apraxia: Secondary | ICD-10-CM | POA: Diagnosis not present

## 2022-10-02 DIAGNOSIS — I616 Nontraumatic intracerebral hemorrhage, multiple localized: Secondary | ICD-10-CM | POA: Diagnosis not present

## 2022-10-02 DIAGNOSIS — I609 Nontraumatic subarachnoid hemorrhage, unspecified: Secondary | ICD-10-CM | POA: Diagnosis not present

## 2022-10-02 DIAGNOSIS — I6389 Other cerebral infarction: Secondary | ICD-10-CM | POA: Diagnosis not present

## 2022-10-02 DIAGNOSIS — T827XXA Infection and inflammatory reaction due to other cardiac and vascular devices, implants and grafts, initial encounter: Secondary | ICD-10-CM | POA: Diagnosis not present

## 2022-10-02 DIAGNOSIS — E66811 Obesity, class 1: Secondary | ICD-10-CM | POA: Diagnosis present

## 2022-10-02 DIAGNOSIS — I63412 Cerebral infarction due to embolism of left middle cerebral artery: Principal | ICD-10-CM | POA: Diagnosis present

## 2022-10-02 DIAGNOSIS — I6310 Cerebral infarction due to embolism of unspecified precerebral artery: Secondary | ICD-10-CM | POA: Diagnosis not present

## 2022-10-02 DIAGNOSIS — I729 Aneurysm of unspecified site: Secondary | ICD-10-CM | POA: Diagnosis not present

## 2022-10-02 DIAGNOSIS — K9421 Gastrostomy hemorrhage: Secondary | ICD-10-CM | POA: Diagnosis not present

## 2022-10-02 DIAGNOSIS — Z66 Do not resuscitate: Secondary | ICD-10-CM | POA: Diagnosis present

## 2022-10-02 DIAGNOSIS — J690 Pneumonitis due to inhalation of food and vomit: Secondary | ICD-10-CM | POA: Diagnosis not present

## 2022-10-02 DIAGNOSIS — J9601 Acute respiratory failure with hypoxia: Secondary | ICD-10-CM | POA: Diagnosis present

## 2022-10-02 DIAGNOSIS — I69351 Hemiplegia and hemiparesis following cerebral infarction affecting right dominant side: Secondary | ICD-10-CM | POA: Diagnosis not present

## 2022-10-02 DIAGNOSIS — I5023 Acute on chronic systolic (congestive) heart failure: Secondary | ICD-10-CM | POA: Diagnosis not present

## 2022-10-02 DIAGNOSIS — R471 Dysarthria and anarthria: Secondary | ICD-10-CM | POA: Diagnosis present

## 2022-10-02 DIAGNOSIS — R414 Neurologic neglect syndrome: Secondary | ICD-10-CM | POA: Diagnosis not present

## 2022-10-02 DIAGNOSIS — I63413 Cerebral infarction due to embolism of bilateral middle cerebral arteries: Secondary | ICD-10-CM | POA: Diagnosis not present

## 2022-10-02 DIAGNOSIS — I5042 Chronic combined systolic (congestive) and diastolic (congestive) heart failure: Secondary | ICD-10-CM | POA: Diagnosis not present

## 2022-10-02 DIAGNOSIS — E119 Type 2 diabetes mellitus without complications: Secondary | ICD-10-CM | POA: Diagnosis present

## 2022-10-02 DIAGNOSIS — Q2112 Patent foramen ovale: Secondary | ICD-10-CM

## 2022-10-02 DIAGNOSIS — I472 Ventricular tachycardia, unspecified: Secondary | ICD-10-CM | POA: Diagnosis not present

## 2022-10-02 DIAGNOSIS — R1312 Dysphagia, oropharyngeal phase: Secondary | ICD-10-CM | POA: Diagnosis present

## 2022-10-02 DIAGNOSIS — Z7189 Other specified counseling: Secondary | ICD-10-CM | POA: Diagnosis not present

## 2022-10-02 DIAGNOSIS — I5043 Acute on chronic combined systolic (congestive) and diastolic (congestive) heart failure: Secondary | ICD-10-CM | POA: Diagnosis present

## 2022-10-02 DIAGNOSIS — R4701 Aphasia: Secondary | ICD-10-CM | POA: Diagnosis present

## 2022-10-02 DIAGNOSIS — B9689 Other specified bacterial agents as the cause of diseases classified elsewhere: Secondary | ICD-10-CM | POA: Diagnosis not present

## 2022-10-02 DIAGNOSIS — R29706 NIHSS score 6: Secondary | ICD-10-CM | POA: Diagnosis present

## 2022-10-02 DIAGNOSIS — B957 Other staphylococcus as the cause of diseases classified elsewhere: Secondary | ICD-10-CM | POA: Diagnosis not present

## 2022-10-02 DIAGNOSIS — I513 Intracardiac thrombosis, not elsewhere classified: Secondary | ICD-10-CM | POA: Diagnosis present

## 2022-10-02 DIAGNOSIS — I10 Essential (primary) hypertension: Secondary | ICD-10-CM | POA: Diagnosis present

## 2022-10-02 DIAGNOSIS — I629 Nontraumatic intracranial hemorrhage, unspecified: Secondary | ICD-10-CM | POA: Diagnosis not present

## 2022-10-02 DIAGNOSIS — Z7984 Long term (current) use of oral hypoglycemic drugs: Secondary | ICD-10-CM

## 2022-10-02 DIAGNOSIS — R1311 Dysphagia, oral phase: Secondary | ICD-10-CM | POA: Diagnosis present

## 2022-10-02 DIAGNOSIS — D696 Thrombocytopenia, unspecified: Secondary | ICD-10-CM | POA: Diagnosis not present

## 2022-10-02 DIAGNOSIS — I255 Ischemic cardiomyopathy: Secondary | ICD-10-CM | POA: Diagnosis present

## 2022-10-02 DIAGNOSIS — I330 Acute and subacute infective endocarditis: Secondary | ICD-10-CM | POA: Diagnosis not present

## 2022-10-02 DIAGNOSIS — I5022 Chronic systolic (congestive) heart failure: Secondary | ICD-10-CM | POA: Diagnosis not present

## 2022-10-02 DIAGNOSIS — I253 Aneurysm of heart: Secondary | ICD-10-CM | POA: Diagnosis not present

## 2022-10-02 DIAGNOSIS — R29810 Facial weakness: Secondary | ICD-10-CM | POA: Diagnosis present

## 2022-10-02 DIAGNOSIS — I252 Old myocardial infarction: Secondary | ICD-10-CM | POA: Diagnosis not present

## 2022-10-02 DIAGNOSIS — I639 Cerebral infarction, unspecified: Secondary | ICD-10-CM | POA: Diagnosis not present

## 2022-10-02 DIAGNOSIS — Z7982 Long term (current) use of aspirin: Secondary | ICD-10-CM

## 2022-10-02 DIAGNOSIS — I2510 Atherosclerotic heart disease of native coronary artery without angina pectoris: Secondary | ICD-10-CM | POA: Diagnosis present

## 2022-10-02 DIAGNOSIS — R7881 Bacteremia: Secondary | ICD-10-CM | POA: Diagnosis present

## 2022-10-02 DIAGNOSIS — Z515 Encounter for palliative care: Secondary | ICD-10-CM

## 2022-10-02 DIAGNOSIS — Z95810 Presence of automatic (implantable) cardiac defibrillator: Secondary | ICD-10-CM | POA: Diagnosis not present

## 2022-10-02 DIAGNOSIS — G936 Cerebral edema: Secondary | ICD-10-CM | POA: Diagnosis not present

## 2022-10-02 DIAGNOSIS — F17210 Nicotine dependence, cigarettes, uncomplicated: Secondary | ICD-10-CM | POA: Diagnosis present

## 2022-10-02 DIAGNOSIS — Z79899 Other long term (current) drug therapy: Secondary | ICD-10-CM

## 2022-10-02 DIAGNOSIS — I63312 Cerebral infarction due to thrombosis of left middle cerebral artery: Secondary | ICD-10-CM | POA: Diagnosis not present

## 2022-10-02 DIAGNOSIS — H409 Unspecified glaucoma: Secondary | ICD-10-CM | POA: Diagnosis present

## 2022-10-02 DIAGNOSIS — Z6832 Body mass index (BMI) 32.0-32.9, adult: Secondary | ICD-10-CM

## 2022-10-02 DIAGNOSIS — R509 Fever, unspecified: Secondary | ICD-10-CM | POA: Diagnosis not present

## 2022-10-02 DIAGNOSIS — R1310 Dysphagia, unspecified: Secondary | ICD-10-CM | POA: Diagnosis not present

## 2022-10-02 DIAGNOSIS — Z7902 Long term (current) use of antithrombotics/antiplatelets: Secondary | ICD-10-CM

## 2022-10-02 DIAGNOSIS — I6300 Cerebral infarction due to thrombosis of unspecified precerebral artery: Secondary | ICD-10-CM | POA: Diagnosis not present

## 2022-10-02 DIAGNOSIS — E785 Hyperlipidemia, unspecified: Secondary | ICD-10-CM | POA: Diagnosis not present

## 2022-10-02 DIAGNOSIS — Z8249 Family history of ischemic heart disease and other diseases of the circulatory system: Secondary | ICD-10-CM

## 2022-10-02 DIAGNOSIS — F4320 Adjustment disorder, unspecified: Secondary | ICD-10-CM | POA: Diagnosis not present

## 2022-10-02 DIAGNOSIS — I25810 Atherosclerosis of coronary artery bypass graft(s) without angina pectoris: Secondary | ICD-10-CM | POA: Diagnosis not present

## 2022-10-02 DIAGNOSIS — E871 Hypo-osmolality and hyponatremia: Secondary | ICD-10-CM | POA: Diagnosis not present

## 2022-10-02 DIAGNOSIS — I611 Nontraumatic intracerebral hemorrhage in hemisphere, cortical: Secondary | ICD-10-CM | POA: Diagnosis not present

## 2022-10-02 DIAGNOSIS — I700 Atherosclerosis of aorta: Secondary | ICD-10-CM | POA: Diagnosis not present

## 2022-10-02 DIAGNOSIS — Z9842 Cataract extraction status, left eye: Secondary | ICD-10-CM

## 2022-10-02 DIAGNOSIS — I63512 Cerebral infarction due to unspecified occlusion or stenosis of left middle cerebral artery: Secondary | ICD-10-CM | POA: Diagnosis not present

## 2022-10-02 DIAGNOSIS — I517 Cardiomegaly: Secondary | ICD-10-CM | POA: Diagnosis not present

## 2022-10-02 DIAGNOSIS — I69391 Dysphagia following cerebral infarction: Secondary | ICD-10-CM

## 2022-10-02 DIAGNOSIS — E876 Hypokalemia: Secondary | ICD-10-CM | POA: Diagnosis present

## 2022-10-02 DIAGNOSIS — Z9841 Cataract extraction status, right eye: Secondary | ICD-10-CM

## 2022-10-02 DIAGNOSIS — I110 Hypertensive heart disease with heart failure: Secondary | ICD-10-CM | POA: Diagnosis present

## 2022-10-02 DIAGNOSIS — I509 Heart failure, unspecified: Secondary | ICD-10-CM | POA: Diagnosis not present

## 2022-10-02 DIAGNOSIS — R29708 NIHSS score 8: Secondary | ICD-10-CM | POA: Diagnosis not present

## 2022-10-02 DIAGNOSIS — I619 Nontraumatic intracerebral hemorrhage, unspecified: Secondary | ICD-10-CM | POA: Diagnosis not present

## 2022-10-02 LAB — URINALYSIS, ROUTINE W REFLEX MICROSCOPIC
Bacteria, UA: NONE SEEN
Bilirubin Urine: NEGATIVE
Glucose, UA: >=500 mg/dL — AB
Ketones, ur: NEGATIVE mg/dL
Leukocytes,Ua: NEGATIVE
Nitrite: NEGATIVE
Protein, ur: NEGATIVE mg/dL
Specific Gravity, Urine: >1.046 — ABNORMAL HIGH (ref 1.005–1.030)
pH: 6 (ref 5.0–8.0)

## 2022-10-02 LAB — GLUCOSE, CAPILLARY: Glucose-Capillary: 122 mg/dL — ABNORMAL HIGH (ref 70–99)

## 2022-10-02 LAB — COMPREHENSIVE METABOLIC PANEL
ALT: 16 U/L (ref 0–44)
AST: 19 U/L (ref 15–41)
Albumin: 4 g/dL (ref 3.5–5.0)
Alkaline Phosphatase: 117 U/L (ref 38–126)
Anion gap: 15 (ref 5–15)
BUN: 9 mg/dL (ref 8–23)
CO2: 23 mmol/L (ref 22–32)
Calcium: 9.4 mg/dL (ref 8.9–10.3)
Chloride: 101 mmol/L (ref 98–111)
Creatinine, Ser: 1.1 mg/dL (ref 0.61–1.24)
GFR, Estimated: >60 mL/min (ref >60)
Glucose, Bld: 81 mg/dL (ref 70–99)
Potassium: 4 mmol/L (ref 3.5–5.1)
Sodium: 139 mmol/L (ref 135–145)
Total Bilirubin: 0.6 mg/dL (ref 0.3–1.2)
Total Protein: 7 g/dL (ref 6.5–8.1)

## 2022-10-02 LAB — CBC
HCT: 50 % (ref 39.0–52.0)
Hemoglobin: 16.7 g/dL (ref 13.0–17.0)
MCH: 29.4 pg (ref 26.0–34.0)
MCHC: 33.4 g/dL (ref 30.0–36.0)
MCV: 88 fL (ref 80.0–100.0)
Platelets: 148 10*3/uL — ABNORMAL LOW (ref 150–400)
RBC: 5.68 MIL/uL (ref 4.22–5.81)
RDW: 13 % (ref 11.5–15.5)
WBC: 5.5 10*3/uL (ref 4.0–10.5)
nRBC: 0 % (ref 0.0–0.2)

## 2022-10-02 LAB — DIFFERENTIAL
Abs Immature Granulocytes: 0.01 10*3/uL (ref 0.00–0.07)
Basophils Absolute: 0.1 10*3/uL (ref 0.0–0.1)
Basophils Relative: 1 %
Eosinophils Absolute: 0.1 10*3/uL (ref 0.0–0.5)
Eosinophils Relative: 1 %
Immature Granulocytes: 0 %
Lymphocytes Relative: 47 %
Lymphs Abs: 2.6 10*3/uL (ref 0.7–4.0)
Monocytes Absolute: 0.7 10*3/uL (ref 0.1–1.0)
Monocytes Relative: 13 %
Neutro Abs: 2.1 10*3/uL (ref 1.7–7.7)
Neutrophils Relative %: 38 %

## 2022-10-02 LAB — I-STAT CHEM 8, ED
BUN: 9 mg/dL (ref 8–23)
Calcium, Ion: 1.16 mmol/L (ref 1.15–1.40)
Chloride: 105 mmol/L (ref 98–111)
Creatinine, Ser: 1.1 mg/dL (ref 0.61–1.24)
Glucose, Bld: 79 mg/dL (ref 70–99)
HCT: 51 % (ref 39.0–52.0)
Hemoglobin: 17.3 g/dL — ABNORMAL HIGH (ref 13.0–17.0)
Potassium: 3.9 mmol/L (ref 3.5–5.1)
Sodium: 139 mmol/L (ref 135–145)
TCO2: 24 mmol/L (ref 22–32)

## 2022-10-02 LAB — PROTIME-INR
INR: 1.1 (ref 0.8–1.2)
Prothrombin Time: 14.3 seconds (ref 11.4–15.2)

## 2022-10-02 LAB — CBG MONITORING, ED: Glucose-Capillary: 89 mg/dL (ref 70–99)

## 2022-10-02 LAB — RAPID URINE DRUG SCREEN, HOSP PERFORMED
Amphetamines: NOT DETECTED
Barbiturates: NOT DETECTED
Benzodiazepines: NOT DETECTED
Cocaine: NOT DETECTED
Opiates: NOT DETECTED
Tetrahydrocannabinol: NOT DETECTED

## 2022-10-02 LAB — MRSA NEXT GEN BY PCR, NASAL: MRSA by PCR Next Gen: NOT DETECTED

## 2022-10-02 LAB — APTT: aPTT: 30 seconds (ref 24–36)

## 2022-10-02 LAB — ETHANOL: Alcohol, Ethyl (B): <10 mg/dL (ref <10)

## 2022-10-02 MED ORDER — ONDANSETRON HCL 4 MG/2ML IJ SOLN
4.0000 mg | Freq: Four times a day (QID) | INTRAMUSCULAR | Status: DC | PRN
  Administered 2022-10-02 – 2022-11-03 (×4): 4 mg via INTRAVENOUS
  Filled 2022-10-02 (×5): qty 2

## 2022-10-02 MED ORDER — ACETAMINOPHEN 160 MG/5ML PO SOLN: 650.0000 mg | ORAL | Status: DC | PRN

## 2022-10-02 MED ORDER — GABAPENTIN 300 MG PO CAPS
300.0000 mg | ORAL_CAPSULE | Freq: Every day | ORAL | Status: DC
  Administered 2022-10-02: 300 mg via ORAL
  Filled 2022-10-02 (×2): qty 1

## 2022-10-02 MED ORDER — PANTOPRAZOLE SODIUM 40 MG IV SOLR: 40.0000 mg | Freq: Every day | INTRAVENOUS | Status: DC

## 2022-10-02 MED ORDER — ATORVASTATIN CALCIUM 80 MG PO TABS
80.0000 mg | ORAL_TABLET | Freq: Every day | ORAL | Status: DC
  Administered 2022-10-02: 80 mg via ORAL
  Filled 2022-10-02: qty 1

## 2022-10-02 MED ORDER — SODIUM CHLORIDE 0.9 % IV SOLN: INTRAVENOUS | Status: DC

## 2022-10-02 MED ORDER — IOHEXOL 350 MG/ML SOLN
75.0000 mL | Freq: Once | INTRAVENOUS | Status: AC | PRN
  Administered 2022-10-02: 75 mL via INTRAVENOUS

## 2022-10-02 MED ORDER — CHLORHEXIDINE GLUCONATE CLOTH 2 % EX PADS
6.0000 | MEDICATED_PAD | Freq: Every day | CUTANEOUS | Status: DC
  Administered 2022-10-02 – 2022-10-29 (×23): 6 via TOPICAL

## 2022-10-02 MED ORDER — SENNOSIDES-DOCUSATE SODIUM 8.6-50 MG PO TABS
1.0000 | ORAL_TABLET | Freq: Every evening | ORAL | Status: DC | PRN
  Administered 2022-10-06: 1 via ORAL

## 2022-10-02 MED ORDER — NICOTINE 7 MG/24HR TD PT24
7.0000 mg | MEDICATED_PATCH | Freq: Every day | TRANSDERMAL | Status: DC
  Administered 2022-10-02 – 2022-11-16 (×45): 7 mg via TRANSDERMAL
  Filled 2022-10-02 (×45): qty 1

## 2022-10-02 MED ORDER — ACETAMINOPHEN 325 MG PO TABS
650.0000 mg | ORAL_TABLET | ORAL | Status: DC | PRN
  Administered 2022-10-02: 650 mg via ORAL
  Filled 2022-10-02: qty 2

## 2022-10-02 MED ORDER — ACETAMINOPHEN 650 MG RE SUPP: 650.0000 mg | RECTAL | Status: DC | PRN

## 2022-10-02 MED ORDER — PANTOPRAZOLE SODIUM 40 MG PO TBEC
40.0000 mg | DELAYED_RELEASE_TABLET | Freq: Every day | ORAL | Status: DC
  Administered 2022-10-02: 40 mg via ORAL
  Filled 2022-10-02: qty 1

## 2022-10-02 MED ORDER — ADULT MULTIVITAMIN W/MINERALS CH
1.0000 | ORAL_TABLET | Freq: Every day | ORAL | Status: DC
  Administered 2022-10-02: 1 via ORAL
  Filled 2022-10-02: qty 1

## 2022-10-02 MED ORDER — STROKE: EARLY STAGES OF RECOVERY BOOK
Freq: Once | Status: AC
  Administered 2022-10-03: 1
  Filled 2022-10-02: qty 1

## 2022-10-02 MED ORDER — TENECTEPLASE FOR STROKE
25.0000 mg | PACK | Freq: Once | INTRAVENOUS | Status: AC
  Administered 2022-10-02: 25 mg via INTRAVENOUS
  Filled 2022-10-02: qty 10

## 2022-10-02 NOTE — Code Documentation (Signed)
[name] is a 62 yr old male with a PMH of prior CVAs, CAD cardiomyopathy, CHF, ICD implanted, smoker. He is presenting to [facility] on 6/18/25024. Pt is from home where he was LKW this morning at 0900. Pt noticed that he was weak on the left, and was having trouble speaking, so he called EMS. Pt takes aspirin and Plavix.    Pt met by stroke team on arrival at bridge. Blood work, CBG obtained. Airway cleared by EDP. Pt to CT with team. NIHSS 9. Please see documentation for times and NIHSS details) Pt with left sided weakness and sensory loss, left droop and delayed, slightly dysarthric speech. He is disoriented to month and year. He has LLQ quadrantinopia. The following imaging was obtained: CT. Per Dr Roda Shutters, CT was negative for acute hemorrhage. After CT, pt was consented for TNK. We attempted to reach his son [name] but were unable. TNK given at 12:46. CTA then obtained. Per Dr. Roda Shutters, CTA negative for LVO.     Pt back to ED room 30 where his workup will continue. He will need q 15 min VS and NIHSS for 2 hrs, then q 30 for 6 hrs then hourly for first 24 hrs. He will need to be NPO until stroke swallow screen can be done. Bedside handoff with ED RN complete.

## 2022-10-02 NOTE — ED Provider Notes (Signed)
[facility] Provider Note  CSN: [id number] Arrival date & time: 10/02/22 1224  Chief Complaint(s) Code Stroke  HPI [name] is a 62 y.o. male with past medical history as below, significant for myopericarditis, C. difficile colitis, cardiac tamponade, CVA, Dressler syndrome, HLD, ischemic cardiomyopathy, ICD, tobacco abuse who presents to the ED with complaint of stroke alert.  Last known normal was around 9 AM, called EMS around noon.  Patient with left-sided weakness, speech changes, lightheadedness, facial droop.  Airway cleared on arrival, sent to CT with stroke team. +plavix.    Past Medical History Past Medical History:  Diagnosis Date   Abnormal EKG    hx of ischemia showing up on ekg's   Acute myopericarditis    a. 03/2013: readm with hypoxia, tachycardia, elevated ESR/CRP, elevated troponin with Dressler syndrome and myopericarditis; treated with steroids.   Acute respiratory failure (HCC)    a. 01/2013: VDRF. b. 03/2013: hypoxia requiring supp O2 during adm, resolved by discharge.   C. difficile colitis    a. 01/2013 during prolonged adm. b. Recurred 03/2013.   Cardiac tamponade    a. 01/2013 s/p drain.   Cataracts, bilateral    Coronary artery disease    a. s/p MI in 2009 in [place] with stenting of the LCX and LAD;  b. 12/2012 NSTEMI/CAD: LM nl, LAD patent prox stent, LCX 50-70 isr (FFR 0.84), RCA dom, 174m, EF 40-45%-->Med Rx. c. 01/2013: anterolateral STEMI complicated by pericardial effusion (presumed purulent pericarditis) and tamponade s/p drain, ruptured LV pseudoaneurysm s/p CorMatrix patch, CABGx1 (SVG-OM1), fever, CVA, VDRF, C diff.   CVA (cerebral infarction)    a. 01/2013 in setting of prolonged hospitalization - residual L arm weakness.   Dressler syndrome (HCC)    a. 03/2013: readm with hypoxia, tachycardia, elevated ESR/CRP, elevated troponin with Dressler syndrome and myopericarditis; treated with steroids.   Glaucoma     Hyperlipidemia    Hypokalemia    Hyponatremia    a. During late 2014.   Ischemic cardiomyopathy    a. Sept/Oct 2014: EF ~40% (ICM). b. 03/2013: EF 25-30%. Off ACEI due to hypotension (MIXED NICM/ICM).   Optic neuropathy, ischemic    Pericardial effusion    a. 01/2013: pericardial effusion (presumed purulent pericarditis) and cardiac tamponade s/p drain. b. Persistent moderate pericardial effusion 03/2013.   Pre-diabetes    Pseudoaneurysm of left ventricle of heart    a. 01/2013:  Ruptured inferoposterior LV pseudoaneurysm s/p CorMatrix patch.   Sinus bradycardia    asymptomatic   Stroke [facility]) 2009   weak lt side-lt arm   Tobacco abuse    Wears dentures    top   Patient Active Problem List   Diagnosis Date Noted   Stroke (cerebrum) (HCC) 10/02/2022   Cerebellar stroke (HCC) 08/06/2022   Cryptogenic stroke (HCC) 08/06/2022   At risk for obstructive sleep apnea 08/06/2022   Tobacco use disorder 08/06/2022   Abnormality of gait due to impairment of balance 08/06/2022   TIA (transient ischemic attack) 05/04/2022   Chronic systolic CHF (congestive heart failure) (HCC) 05/04/2022   Expressive dysphasia 05/04/2022   Unsteady gait 05/04/2022   Near syncope 05/04/2022   Chronic systolic heart failure (HCC) 12/01/2021   ICD (implantable cardioverter-defibrillator) in place 12/01/2021   Rectal bleed 03/06/2021   Unstable angina (HCC)    Hx of CABG 07/09/2019   Lumbar radiculopathy 12/04/2018   Glaucoma 12/04/2018   Cortical age-related cataract of both eyes 12/04/2018   Pain management contract  agreement 12/04/2018   Lumbar herniated disc 06/04/2018   Prediabetes 01/01/2018   Hyperlipidemia 12/30/2017   Obesity (BMI 30.0-34.9) 12/30/2017   Benign neoplasm of descending colon    Essential hypertension 11/03/2015   Cardiomyopathy, ischemic 06/03/2015   Chronic systolic dysfunction of left ventricle 06/03/2015   Erectile dysfunction due to arterial insufficiency 12/23/2014    Lipoma of skin and subcutaneous tissue of neck 05/24/2014   History of cardioembolic stroke 11/02/2013   Vision loss of right eye 06/23/2013   Dyslipidemia 06/22/2013   Dressler syndrome [facility])    Coronary artery disease    Cardiomyopathy (HCC)    Pseudoaneurysm of left ventricle of heart    Tobacco abuse    Home Medication(s) Prior to Admission medications   Medication Sig Start Date End Date Taking? Authorizing Provider  ALPHAGAN P 0.1 % SOLN INSTILL 1 DROP INTO BOTH EYES TWICE A DAY Patient taking differently: Place 1 drop into both eyes in the morning and at bedtime. 10/26/19   [name], MD  aspirin 81 MG EC tablet Take 1 tablet (81 mg total) by mouth daily. Restart on 07/05/2018 06/30/18   Jadene Pierini, MD  atorvastatin (LIPITOR) 80 MG tablet Take 1 tablet (80 mg total) by mouth daily. 06/07/22   [name], MD  carvedilol (COREG) 25 MG tablet Take 0.5 tablets (12.5 mg total) by mouth 2 (two) times daily with a meal. 05/08/22   [name], MD  cetirizine (ZYRTEC) 10 MG tablet Take 1 tablet (10 mg total) by mouth daily. 08/12/21   Carlisle Beers, FNP  Cholecalciferol (VITAMIN D) 125 MCG (5000 UT) CAPS Take 5,000 Units by mouth daily.    [provider]  clopidogrel (PLAVIX) 75 MG tablet Take 1 tablet (75 mg total) by mouth daily. 06/20/22   [name], MD  dapagliflozin propanediol (FARXIGA) 10 MG TABS tablet TAKE 1 TABLET EVERY DAY Patient taking differently: Take 10 mg by mouth daily. 12/20/21   Allred, Fayrene Fearing, MD  ENTRESTO 97-103 MG TAKE 1 TABLET TWICE DAILY Patient taking differently: Take 1 tablet by mouth 2 (two) times daily. 05/02/22   [place], Peter M, MD  fluticasone [facility]) 50 MCG/ACT nasal spray Place 1 spray into both nostrils daily. 08/12/21   Carlisle Beers, FNP  furosemide (LASIX) 20 MG tablet Take 1 tablet (20 mg total) by mouth daily. 02/01/21   [place], Peter M, MD  gabapentin (NEURONTIN) 300 MG capsule Take 1 capsule  (300 mg total) by mouth at bedtime. 09/18/21   [name], MD  latanoprost (XALATAN) 0.005 % ophthalmic solution Place 1 drop into both eyes at bedtime. 05/31/16   [provider]  methocarbamol (ROBAXIN) 500 MG tablet TAKE 1 TABLET (500 MG TOTAL) BY MOUTH 2 (TWO) TIMES DAILY AS NEEDED FOR MUSCLE SPASMS. 10/18/20   [name], MD  Multiple Vitamin (MULTIVITAMIN WITH MINERALS) TABS tablet Take 1 tablet by mouth daily. Centrum Silver    [provider]  nicotine (NICODERM CQ - DOSED IN MG/24 HR) 7 mg/24hr patch Place 1 patch (7 mg total) onto the skin daily. 05/08/22   [name], MD  nitroGLYCERIN (NITROSTAT) 0.4 MG SL tablet Place 1 tablet (0.4 mg total) under the tongue every 5 (five) minutes x 3 doses as needed for chest pain. 09/14/20   Arty Baumgartner, NP  spironolactone (ALDACTONE) 25 MG tablet Take 0.5 tablets (12.5 mg total) by mouth daily. 05/08/22   [name], MD  Past Surgical History Past Surgical History:  Procedure Laterality Date   CARDIAC CATHETERIZATION  01/06/2013   CATARACT EXTRACTION Bilateral 05/2021   COLONOSCOPY WITH PROPOFOL N/A 07/03/2016   Procedure: COLONOSCOPY WITH PROPOFOL;  Surgeon: Ruffin Frederick, MD;  Location: [facility];  Service: Gastroenterology;  Laterality: N/A;   CORONARY ANGIOPLASTY WITH STENT PLACEMENT  2000's X 2   "1 + 1" (01/06/2013)   CORONARY ARTERY BYPASS GRAFT N/A 01/28/2013   Procedure: CORONARY ARTERY BYPASS GRAFTING (CABG);  Surgeon: Loreli Slot, MD;  Location: [facility] OR;  Service: Open Heart Surgery;  Laterality: N/A;  CABG x one, using left greater saphenous vein harvested endoscopically   ICD IMPLANT N/A 05/25/2020   Procedure: ICD IMPLANT;  Surgeon: Hillis Range, MD;  Location: [facility] INVASIVE CV LAB;  Service: Cardiovascular;  Laterality: N/A;    INTRAOPERATIVE TRANSESOPHAGEAL ECHOCARDIOGRAM N/A 01/28/2013   Procedure: INTRAOPERATIVE TRANSESOPHAGEAL ECHOCARDIOGRAM;  Surgeon: Loreli Slot, MD;  Location: [facility] OR;  Service: Open Heart Surgery;  Laterality: N/A;   LEFT HEART CATH AND CORS/GRAFTS ANGIOGRAPHY N/A 09/13/2020   Procedure: LEFT HEART CATH AND CORS/GRAFTS ANGIOGRAPHY;  Surgeon: [place], Peter M, MD;  Location: [facility] INVASIVE CV LAB;  Service: Cardiovascular;  Laterality: N/A;   LEFT HEART CATHETERIZATION WITH CORONARY ANGIOGRAM N/A 01/06/2013   Procedure: LEFT HEART CATHETERIZATION WITH CORONARY ANGIOGRAM;  Surgeon: Peter M [place], MD;  Location: [facility] CATH LAB;  Service: Cardiovascular;  Laterality: N/A;   LEFT HEART CATHETERIZATION WITH CORONARY ANGIOGRAM N/A 01/21/2013   Procedure: LEFT HEART CATHETERIZATION WITH CORONARY ANGIOGRAM;  Surgeon: Kathleene Hazel, MD;  Location: [facility] CATH LAB;  Service: Cardiovascular;  Laterality: N/A;   LUMBAR LAMINECTOMY/ DECOMPRESSION WITH MET-RX Right 06/30/2018   Procedure: Right minimally invasive Lumbar Three-Four Far lateral discectomy;  Surgeon: Jadene Pierini, MD;  Location: [facility] OR;  Service: Neurosurgery;  Laterality: Right;  Right minimally invasive Lumbar Three-Four Far lateral discectomy   MASS EXCISION Right 07/21/2014   Procedure: EXCISION RIGHT NECK MASS;  Surgeon: Harriette Bouillon, MD;  Location: [facility];  Service: General;  Laterality: Right;   PERICARDIAL TAP N/A 01/24/2013   Procedure: PERICARDIAL TAP;  Surgeon: Micheline Chapman, MD;  Location: [facility] CATH LAB;  Service: Cardiovascular;  Laterality: N/A;   RIGHT HEART CATHETERIZATION Right 01/24/2013   Procedure: RIGHT HEART CATH;  Surgeon: Micheline Chapman, MD;  Location: [facility] CATH LAB;  Service: Cardiovascular;  Laterality: Right;   VENTRICULAR ANEURYSM RESECTION N/A 01/28/2013   Procedure: LEFT VENTRICULAR ANEURYSM REPAIR;  Surgeon: Loreli Slot, MD;  Location: [facility] OR;  Service: Open Heart Surgery;   Laterality: N/A;   Family History Family History  Problem Relation Age of Onset   Heart attack Mother    Diabetes Mother    Hypertension Mother    Hypertension Father    CAD Other    HIV Brother    Stroke Neg Hx     Social History Social History   Tobacco Use   Smoking status: Every Day    Packs/day: 1.00    Years: 35.00    Additional pack years: 0.00    Total pack years: 35.00    Types: Cigarettes   Smokeless tobacco: Never  Vaping Use   Vaping Use: Never used  Substance Use Topics   Alcohol use: No    Alcohol/week: 0.0 standard drinks of alcohol   Drug use: No   Allergies Patient has no known allergies.  Review of Systems Review of Systems  Unable to perform ROS:  Acuity of condition  Neurological:  Positive for dizziness, speech difficulty, weakness, light-headedness and numbness.    Physical Exam Vital Signs  I have reviewed the triage vital signs BP 101/73   Pulse 64   Temp 98.2 F (36.8 C) (Oral)   Resp (!) 26   Wt 108.6 kg   SpO2 97%   BMI 32.47 kg/m  Physical Exam Vitals and nursing note reviewed.  Constitutional:      General: He is not in acute distress.    Appearance: Normal appearance. He is not ill-appearing or diaphoretic.  HENT:     Head: Normocephalic and atraumatic.     Right Ear: External ear normal.     Left Ear: External ear normal.     Nose: Nose normal.     Mouth/Throat:     Mouth: Mucous membranes are moist.  Eyes:     General: No scleral icterus.    Extraocular Movements: Extraocular movements intact.     Pupils: Pupils are equal, round, and reactive to light.  Cardiovascular:     Rate and Rhythm: Normal rate and regular rhythm.     Pulses: Normal pulses.     Heart sounds: Normal heart sounds. No murmur heard. Pulmonary:     Effort: Pulmonary effort is normal. No respiratory distress.     Breath sounds: Normal breath sounds. No stridor. No wheezing.     Comments: tachypnea Abdominal:     General: Abdomen is flat.  There is no distension.     Tenderness: There is no guarding.  Musculoskeletal:     Cervical back: No rigidity.     Right lower leg: No edema.     Left lower leg: No edema.  Skin:    General: Skin is warm.     Capillary Refill: Capillary refill takes less than 2 seconds.     Coloration: Skin is not jaundiced or pale.  Neurological:     Mental Status: He is alert and oriented to person, place, and time.     GCS: GCS eye subscore is 4. GCS verbal subscore is 5. GCS motor subscore is 6.     Cranial Nerves: Dysarthria and facial asymmetry present.     Sensory: Sensory deficit present.     Motor: Weakness present.     Coordination: Coordination is intact.     Comments: Gait testing deferred Mild dysarthria noted Mild drift L Mild sensation loss     ED Results and Treatments Labs (all labs ordered are listed, but only abnormal results are displayed) Labs Reviewed  CBC - Abnormal; Notable for the following components:      Result Value   Platelets 148 (*)    All other components within normal limits  GLUCOSE, CAPILLARY - Abnormal; Notable for the following components:   Glucose-Capillary 122 (*)    All other components within normal limits  I-STAT CHEM 8, ED - Abnormal; Notable for the following components:   Hemoglobin 17.3 (*)    All other components within normal limits  ETHANOL  PROTIME-INR  APTT  DIFFERENTIAL  COMPREHENSIVE METABOLIC PANEL  RAPID URINE DRUG SCREEN, HOSP PERFORMED  URINALYSIS, ROUTINE W REFLEX MICROSCOPIC  CBG MONITORING, ED  Radiology CT ANGIO HEAD NECK W WO CM (CODE STROKE)  Result Date: 10/02/2022 CLINICAL DATA:  Left-sided weakness and speech changes. EXAM: CT ANGIOGRAPHY HEAD AND NECK WITH AND WITHOUT CONTRAST TECHNIQUE: Multidetector CT imaging of the head and neck was performed using the standard protocol during bolus  administration of intravenous contrast. Multiplanar CT image reconstructions and MIPs were obtained to evaluate the vascular anatomy. Carotid stenosis measurements (when applicable) are obtained utilizing NASCET criteria, using the distal internal carotid diameter as the denominator. RADIATION DOSE REDUCTION: This exam was performed according to the departmental dose-optimization program which includes automated exposure control, adjustment of the mA and/or kV according to patient size and/or use of iterative reconstruction technique. CONTRAST:  75mL OMNIPAQUE IOHEXOL 350 MG/ML SOLN COMPARISON:  Head and neck CTA 05/04/2022 FINDINGS: CTA NECK FINDINGS Aortic arch: Normal variant aortic arch branching pattern with common origin of the brachiocephalic and left common carotid arteries. Mild atherosclerosis without significant stenosis of the arch vessel origins. Right carotid system: Patent without evidence of stenosis or dissection. Left carotid system: Patent without evidence of stenosis or dissection. Small volume calcified and soft plaque in the carotid bulb. Vertebral arteries: Patent without evidence of stenosis or dissection. Dominant right vertebral artery. Skeleton: Mild cervical spondylosis. Other neck: Motion artifact through the pharynx and larynx. No evidence of cervical lymphadenopathy. Upper chest: Respiratory motion without a mass or consolidation in the included lung apices. Review of the MIP images confirms the above findings CTA HEAD FINDINGS Anterior circulation: The internal carotid arteries are patent from skull base to carotid termini with minor atherosclerosis and no significant stenosis. ACAs and MCAs are patent with mild atherosclerotic regularity but no evidence of a proximal branch occlusion or flow limiting proximal stenosis. A fenestration is noted in the distal left A1 and proximal A2 segments. No aneurysm is identified. Posterior circulation: The intracranial vertebral arteries are  patent to the basilar with the left being hypoplastic distal to the PICA origin. Patent PICA and SCA origins are visualized bilaterally. The basilar artery is widely patent. There are robust posterior communicating arteries bilaterally, right larger than left, and the right P1 segment is hypoplastic or absent. Both PCAs are patent without evidence of a significant proximal stenosis. No aneurysm is identified. Venous sinuses: As permitted by contrast timing, patent. Anatomic variants: Fetal right PCA. Review of the MIP images confirms the above findings These results were communicated to Dr. Roda Shutters at 1:02 pm on 10/02/2022 by text page via the [facility] messaging system. IMPRESSION: 1. Mild atherosclerosis in the head and neck without a large vessel occlusion or significant proximal stenosis. 2.  Aortic Atherosclerosis (ICD10-I70.0). Electronically Signed   By: Sebastian Ache M.D.   On: 10/02/2022 13:13   CT HEAD CODE STROKE WO CONTRAST  Result Date: 10/02/2022 CLINICAL DATA:  Code stroke. Left-sided weakness and speech changes. EXAM: CT HEAD WITHOUT CONTRAST TECHNIQUE: Contiguous axial images were obtained from the base of the skull through the vertex without intravenous contrast. RADIATION DOSE REDUCTION: This exam was performed according to the departmental dose-optimization program which includes automated exposure control, adjustment of the mA and/or kV according to patient size and/or use of iterative reconstruction technique. COMPARISON:  Brain MRI and CT/CTA head and neck 05/04/2022 FINDINGS: Brain: There is no evidence of acute intracranial hemorrhage, extra-axial fluid collection, or acute territorial infarct. Multiple remote infarcts are seen in the bilateral cerebral and cerebellar hemispheres. Parenchymal volume is stable. The ventricles are stable in size. The pituitary and suprasellar region are normal.  There is no mass lesion. There is no mass effect or midline shift. Vascular: No hyperdense vessel or  unexpected calcification. Skull: Normal. Negative for fracture or focal lesion. Sinuses/Orbits: The imaged paranasal sinuses are clear. Bilateral lens implants are in place. The globes and orbits are otherwise unremarkable. Other: The mastoid air cells and middle ear cavities are clear. ASPECTS [facility] Stroke Program Early CT Score) - Ganglionic level infarction (caudate, lentiform nuclei, internal capsule, insula, M1-M3 cortex): 7 - Supraganglionic infarction (M4-M6 cortex): Total score (0-10 with 10 being normal): 10 IMPRESSION: No acute intracranial pathology. Findings communicated to Dr Roda Shutters at 12:45 pm. Electronically Signed   By: Lesia Hausen M.D.   On: 10/02/2022 12:47    Pertinent labs & imaging results that were available during my care of the patient were reviewed by me and considered in my medical decision making (see MDM for details).  Medications Ordered in ED Medications   stroke: early stages of recovery book (has no administration in time range)  0.9 %  sodium chloride infusion ( Intravenous New Bag/Given 10/02/22 1328)  acetaminophen (TYLENOL) tablet 650 mg (has no administration in time range)    Or  acetaminophen (TYLENOL) 160 MG/5ML solution 650 mg (has no administration in time range)    Or  acetaminophen (TYLENOL) suppository 650 mg (has no administration in time range)  senna-docusate (Senokot-S) tablet 1 tablet (has no administration in time range)  pantoprazole (PROTONIX) injection 40 mg (has no administration in time range)  tenecteplase (TNKASE) injection for Stroke 25 mg (25 mg Intravenous Given 10/02/22 1246)  iohexol (OMNIPAQUE) 350 MG/ML injection 75 mL (75 mLs Intravenous Contrast Given 10/02/22 1253)                                                                                                                                     Procedures .Critical Care  Performed by: Sloan Leiter, DO Authorized by: Sloan Leiter, DO   Critical care provider statement:     Critical care time (minutes):  42   Critical care time was exclusive of:  Separately billable procedures and treating other patients   Critical care was necessary to treat or prevent imminent or life-threatening deterioration of the following conditions:  CNS failure or compromise   Critical care was time spent personally by me on the following activities:  Development of treatment plan with patient or surrogate, discussions with consultants, evaluation of patient's response to treatment, examination of patient, ordering and review of laboratory studies, ordering and review of radiographic studies, ordering and performing treatments and interventions, pulse oximetry, re-evaluation of patient's condition, review of old charts and obtaining history from patient or surrogate   Care discussed with: admitting provider     (including critical care time)  Medical Decision Making / ED Course    Medical Decision Making:    SEIKO OUYANG is a 62 y.o. male with extensive cardiac history  as above, medical history as above to the ED secondary to strokelike symptoms and activated stroke alert prior to arrival.. The complaint involves an extensive differential diagnosis and also carries with it a high risk of complications and morbidity.  Serious etiology was considered. Ddx includes but is not limited to: Differential diagnoses for altered mental status includes but is not exclusive to alcohol, illicit or prescription medications, intracranial pathology such as stroke, intracerebral hemorrhage, fever or infectious causes including sepsis, hypoxemia, uremia, trauma, endocrine related disorders such as diabetes, hypoglycemia, thyroid-related diseases, etc.   Complete initial physical exam performed, notably the patient  was breathing comfortably in ambient air, HDS.  Sent to CT with stroke team following clearing of airway by myself Reviewed and confirmed nursing documentation for past medical history, family  history, social history.  Vital signs reviewed.      NIH stroke scale of 6, CT was stable.  Patient then TNK window without contraindication per neurology.  Patient given TNK by stroke team.  Will admit to neuro ICU under stroke team. Labs/imaging stable Defer further management of presumed CVA to stroke team     Additional history obtained: -Additional history obtained from ems -External records from outside source obtained and reviewed including: Chart review including previous notes, labs, imaging, consultation notes including primary care recommendation, medications, prior labs and imaging   Lab Tests: -I ordered, reviewed, and interpreted labs.   The pertinent results include:   Labs Reviewed  CBC - Abnormal; Notable for the following components:      Result Value   Platelets 148 (*)    All other components within normal limits  GLUCOSE, CAPILLARY - Abnormal; Notable for the following components:   Glucose-Capillary 122 (*)    All other components within normal limits  I-STAT CHEM 8, ED - Abnormal; Notable for the following components:   Hemoglobin 17.3 (*)    All other components within normal limits  ETHANOL  PROTIME-INR  APTT  DIFFERENTIAL  COMPREHENSIVE METABOLIC PANEL  RAPID URINE DRUG SCREEN, HOSP PERFORMED  URINALYSIS, ROUTINE W REFLEX MICROSCOPIC  CBG MONITORING, ED    Notable for labs stable  EKG   EKG Interpretation  Date/Time:  Tuesday October 02 2022 13:08:06 EDT Ventricular Rate:  63 PR Interval:  273 QRS Duration: 129 QT Interval:  443 QTC Calculation: 454 R Axis:   -39 Text Interpretation: Sinus rhythm Prolonged PR interval Consider left atrial enlargement Nonspecific IVCD with LAD Inferior infarct, age indeterminate Nonspecific T abnormalities, lateral leads similar to prior no stemi Confirmed by Tanda Rockers ([id number]) on 10/02/2022 2:00:01 PM         Imaging Studies ordered: I ordered imaging studies including CT head, CT angio stroke protocol,  MRI brain without I independently visualized the following imaging with scope of interpretation limited to determining acute life threatening conditions related to emergency care; findings noted above, significant for no large ICH or LVO, MRI is pendinge I independently visualized and interpreted imaging. I agree with the radiologist interpretation   Medicines ordered and prescription drug management: Meds ordered this encounter  Medications   tenecteplase (TNKASE) injection for Stroke 25 mg   iohexol (OMNIPAQUE) 350 MG/ML injection 75 mL    stroke: early stages of recovery book   0.9 %  sodium chloride infusion   OR Linked Order Group    acetaminophen (TYLENOL) tablet 650 mg    acetaminophen (TYLENOL) 160 MG/5ML solution 650 mg    acetaminophen (TYLENOL) suppository 650 mg   senna-docusate (Senokot-S)  tablet 1 tablet   pantoprazole (PROTONIX) injection 40 mg    -I have reviewed the patients home medicines and have made adjustments as needed   Consultations Obtained: I requested consultation with the stroke team/neuro,  and discussed lab and imaging findings as well as pertinent plan - they recommend: TNK/admit   Cardiac Monitoring: The patient was maintained on a cardiac monitor.  I personally viewed and interpreted the cardiac monitored which showed an underlying rhythm of: NSR  Social Determinants of Health:  Diagnosis or treatment significantly limited by social determinants of health: current smoker   Reevaluation: After the interventions noted above, I reevaluated the patient and found that they have stayed the same  Co morbidities that complicate the patient evaluation  Past Medical History:  Diagnosis Date   Abnormal EKG    hx of ischemia showing up on ekg's   Acute myopericarditis    a. 03/2013: readm with hypoxia, tachycardia, elevated ESR/CRP, elevated troponin with Dressler syndrome and myopericarditis; treated with steroids.   Acute respiratory failure (HCC)     a. 01/2013: VDRF. b. 03/2013: hypoxia requiring supp O2 during adm, resolved by discharge.   C. difficile colitis    a. 01/2013 during prolonged adm. b. Recurred 03/2013.   Cardiac tamponade    a. 01/2013 s/p drain.   Cataracts, bilateral    Coronary artery disease    a. s/p MI in 2009 in [place] with stenting of the LCX and LAD;  b. 12/2012 NSTEMI/CAD: LM nl, LAD patent prox stent, LCX 50-70 isr (FFR 0.84), RCA dom, 134m, EF 40-45%-->Med Rx. c. 01/2013: anterolateral STEMI complicated by pericardial effusion (presumed purulent pericarditis) and tamponade s/p drain, ruptured LV pseudoaneurysm s/p CorMatrix patch, CABGx1 (SVG-OM1), fever, CVA, VDRF, C diff.   CVA (cerebral infarction)    a. 01/2013 in setting of prolonged hospitalization - residual L arm weakness.   Dressler syndrome (HCC)    a. 03/2013: readm with hypoxia, tachycardia, elevated ESR/CRP, elevated troponin with Dressler syndrome and myopericarditis; treated with steroids.   Glaucoma    Hyperlipidemia    Hypokalemia    Hyponatremia    a. During late 2014.   Ischemic cardiomyopathy    a. Sept/Oct 2014: EF ~40% (ICM). b. 03/2013: EF 25-30%. Off ACEI due to hypotension (MIXED NICM/ICM).   Optic neuropathy, ischemic    Pericardial effusion    a. 01/2013: pericardial effusion (presumed purulent pericarditis) and cardiac tamponade s/p drain. b. Persistent moderate pericardial effusion 03/2013.   Pre-diabetes    Pseudoaneurysm of left ventricle of heart    a. 01/2013:  Ruptured inferoposterior LV pseudoaneurysm s/p CorMatrix patch.   Sinus bradycardia    asymptomatic   Stroke [facility]) 2009   weak lt side-lt arm   Tobacco abuse    Wears dentures    top      Dispostion: Disposition decision including need for hospitalization was considered, and patient admitted to the hospital.    Final Clinical Impression(s) / ED Diagnoses Final diagnoses:  Cerebrovascular accident (CVA), unspecified mechanism (HCC)     This chart was  dictated using voice recognition software.  Despite best efforts to proofread,  errors can occur which can change the documentation meaning.    Sloan Leiter, DO 10/02/22 1402

## 2022-10-02 NOTE — ED Triage Notes (Signed)
Pt to ED via EMS from home as code stroke with c/o Left side weakness, with difficultly speaking. LKW 0900. Pt has hx of previous CVA. Per EMS family denies any difficulty speaking or left side weakness from previous stroke. Pt arrives to ED alert to self and place. BGL 89, +blood thinners.

## 2022-10-02 NOTE — Progress Notes (Signed)
Pt c/o L side headache pain, no changes in neuro assessment. And pt also Bradycardiac HR 48-55's. Pt has ICD. Dr. Roda Shutters Notified, Tylenol given to pt for headache pain. Tylenol decreased pt's pain. No new orders.

## 2022-10-02 NOTE — Progress Notes (Signed)
PHARMACIST CODE STROKE RESPONSE  Notified to mix TNK at 1243 by Dr. Roda Shutters TNK preparation completed at 1245  TNK dose = 25 mg IV over 5 seconds  Issues/delays encountered (if applicable): NA  Andreas Ohm, PharmD Pharmacy Resident  10/02/2022 12:47 PM

## 2022-10-02 NOTE — H&P (Signed)
Stroke Neurology H&P Note  The history was obtained from the pt and EMS.  During history and examination, all items were able to obtain unless otherwise noted.  History of Present Illness:  [name] is a 62 y.o. African American male with PMH of CAD, CHF s/p ICD, HLD, strokes, smoker presented to ED for code stroke. Per EMS, pt was on the phone with his friend at 9am and was normal at baseline. However, EMS was called at 11:51am that pt complained of left sided weakness, slurry speech, slow talking and feeling dizzy. Pt can not tell exact time onset but did admit that he felt left sided weakness, numbness, abnormal talking and feeling dizzy, more lightheadedness. CT no acute finding but multiple old infarcts. Pt within TNK window and no absolute contraindication for TNK. Discussed with pt about benefit, risk and alternative of TNK, and he agreed to proceed. He also asked me to inform his son via cell phone, however, son did not pick up the phone. I left message for him to call back. TNK given after.   Pt had recent right cerebellar infarct in 04/2022 with nausea, imbalance and right hand ataxia. CT, CTA head and neck negative, MRI showed right cerebellar infarct. EF 35-40%, LDL 62 abd A1C 6.0. put on ASA and brilinta for a month and then back to ASA and plavix. His CT and MRI showed multiple old infarcts at b/l frontal, parietal, occipital and cerebellum.  He also has CAD and CHF, followed with cardiology, on GDMT meds. Has ICD placed in 2022, it is MRI compatible.   He is still current smoker, denies illicit drugs but in 04/2022 his UDS showed positive THC.   LSN: 9am tPA Given: Yes IR: no, no LVO mRS = 2  Past Medical History:  Diagnosis Date   Abnormal EKG    hx of ischemia showing up on ekg's   Acute myopericarditis    a. 03/2013: readm with hypoxia, tachycardia, elevated ESR/CRP, elevated troponin with Dressler syndrome and myopericarditis; treated with steroids.   Acute  respiratory failure (HCC)    a. 01/2013: VDRF. b. 03/2013: hypoxia requiring supp O2 during adm, resolved by discharge.   C. difficile colitis    a. 01/2013 during prolonged adm. b. Recurred 03/2013.   Cardiac tamponade    a. 01/2013 s/p drain.   Cataracts, bilateral    Coronary artery disease    a. s/p MI in 2009 in [place] with stenting of the LCX and LAD;  b. 12/2012 NSTEMI/CAD: LM nl, LAD patent prox stent, LCX 50-70 isr (FFR 0.84), RCA dom, 142m, EF 40-45%-->Med Rx. c. 01/2013: anterolateral STEMI complicated by pericardial effusion (presumed purulent pericarditis) and tamponade s/p drain, ruptured LV pseudoaneurysm s/p CorMatrix patch, CABGx1 (SVG-OM1), fever, CVA, VDRF, C diff.   CVA (cerebral infarction)    a. 01/2013 in setting of prolonged hospitalization - residual L arm weakness.   Dressler syndrome (HCC)    a. 03/2013: readm with hypoxia, tachycardia, elevated ESR/CRP, elevated troponin with Dressler syndrome and myopericarditis; treated with steroids.   Glaucoma    Hyperlipidemia    Hypokalemia    Hyponatremia    a. During late 2014.   Ischemic cardiomyopathy    a. Sept/Oct 2014: EF ~40% (ICM). b. 03/2013: EF 25-30%. Off ACEI due to hypotension (MIXED NICM/ICM).   Optic neuropathy, ischemic    Pericardial effusion    a. 01/2013: pericardial effusion (presumed purulent pericarditis) and cardiac tamponade s/p drain. b. Persistent moderate pericardial effusion 03/2013.  Pre-diabetes    Pseudoaneurysm of left ventricle of heart    a. 01/2013:  Ruptured inferoposterior LV pseudoaneurysm s/p CorMatrix patch.   Sinus bradycardia    asymptomatic   Stroke [facility]) 2009   weak lt side-lt arm   Tobacco abuse    Wears dentures    top    Past Surgical History:  Procedure Laterality Date   CARDIAC CATHETERIZATION  01/06/2013   CATARACT EXTRACTION Bilateral 05/2021   COLONOSCOPY WITH PROPOFOL N/A 07/03/2016   Procedure: COLONOSCOPY WITH PROPOFOL;  Surgeon: Ruffin Frederick, MD;   Location: [facility];  Service: Gastroenterology;  Laterality: N/A;   CORONARY ANGIOPLASTY WITH STENT PLACEMENT  2000's X 2   "1 + 1" (01/06/2013)   CORONARY ARTERY BYPASS GRAFT N/A 01/28/2013   Procedure: CORONARY ARTERY BYPASS GRAFTING (CABG);  Surgeon: Loreli Slot, MD;  Location: [facility] OR;  Service: Open Heart Surgery;  Laterality: N/A;  CABG x one, using left greater saphenous vein harvested endoscopically   ICD IMPLANT N/A 05/25/2020   Procedure: ICD IMPLANT;  Surgeon: Hillis Range, MD;  Location: [facility] INVASIVE CV LAB;  Service: Cardiovascular;  Laterality: N/A;   INTRAOPERATIVE TRANSESOPHAGEAL ECHOCARDIOGRAM N/A 01/28/2013   Procedure: INTRAOPERATIVE TRANSESOPHAGEAL ECHOCARDIOGRAM;  Surgeon: Loreli Slot, MD;  Location: [facility] OR;  Service: Open Heart Surgery;  Laterality: N/A;   LEFT HEART CATH AND CORS/GRAFTS ANGIOGRAPHY N/A 09/13/2020   Procedure: LEFT HEART CATH AND CORS/GRAFTS ANGIOGRAPHY;  Surgeon: Swaziland, Peter M, MD;  Location: [facility] INVASIVE CV LAB;  Service: Cardiovascular;  Laterality: N/A;   LEFT HEART CATHETERIZATION WITH CORONARY ANGIOGRAM N/A 01/06/2013   Procedure: LEFT HEART CATHETERIZATION WITH CORONARY ANGIOGRAM;  Surgeon: Peter M Swaziland, MD;  Location: [facility] CATH LAB;  Service: Cardiovascular;  Laterality: N/A;   LEFT HEART CATHETERIZATION WITH CORONARY ANGIOGRAM N/A 01/21/2013   Procedure: LEFT HEART CATHETERIZATION WITH CORONARY ANGIOGRAM;  Surgeon: Kathleene Hazel, MD;  Location: [facility] CATH LAB;  Service: Cardiovascular;  Laterality: N/A;   LUMBAR LAMINECTOMY/ DECOMPRESSION WITH MET-RX Right 06/30/2018   Procedure: Right minimally invasive Lumbar Three-Four Far lateral discectomy;  Surgeon: Jadene Pierini, MD;  Location: [facility] OR;  Service: Neurosurgery;  Laterality: Right;  Right minimally invasive Lumbar Three-Four Far lateral discectomy   MASS EXCISION Right 07/21/2014   Procedure: EXCISION RIGHT NECK MASS;  Surgeon: Harriette Bouillon, MD;  Location: [facility];  Service: General;  Laterality: Right;   PERICARDIAL TAP N/A 01/24/2013   Procedure: PERICARDIAL TAP;  Surgeon: Micheline Chapman, MD;  Location: [facility] CATH LAB;  Service: Cardiovascular;  Laterality: N/A;   RIGHT HEART CATHETERIZATION Right 01/24/2013   Procedure: RIGHT HEART CATH;  Surgeon: Micheline Chapman, MD;  Location: [facility] CATH LAB;  Service: Cardiovascular;  Laterality: Right;   VENTRICULAR ANEURYSM RESECTION N/A 01/28/2013   Procedure: LEFT VENTRICULAR ANEURYSM REPAIR;  Surgeon: Loreli Slot, MD;  Location: [facility] OR;  Service: Open Heart Surgery;  Laterality: N/A;    Family History  Problem Relation Age of Onset   Heart attack Mother    Diabetes Mother    Hypertension Mother    Hypertension Father    CAD Other    HIV Brother    Stroke Neg Hx     Social History:  reports that he has been smoking cigarettes. He has a 35.00 pack-year smoking history. He has never used smokeless tobacco. He reports that he does not drink alcohol and does not use drugs.  Allergies: No Known Allergies  No current facility-administered medications  on file prior to encounter.   Current Outpatient Medications on File Prior to Encounter  Medication Sig Dispense Refill   ALPHAGAN P 0.1 % SOLN INSTILL 1 DROP INTO BOTH EYES TWICE A DAY (Patient taking differently: Place 1 drop into both eyes in the morning and at bedtime.) 10 mL 0   aspirin 81 MG EC tablet Take 1 tablet (81 mg total) by mouth daily. Restart on 07/05/2018 90 tablet 3   atorvastatin (LIPITOR) 80 MG tablet Take 1 tablet (80 mg total) by mouth daily. 90 tablet 3   carvedilol (COREG) 25 MG tablet Take 0.5 tablets (12.5 mg total) by mouth 2 (two) times daily with a meal. 180 tablet 3   cetirizine (ZYRTEC) 10 MG tablet Take 1 tablet (10 mg total) by mouth daily. 30 tablet 1   Cholecalciferol (VITAMIN D) 125 MCG (5000 UT) CAPS Take 5,000 Units by mouth daily.     clopidogrel (PLAVIX) 75 MG tablet Take 1 tablet (75 mg total)  by mouth daily. 90 tablet 1   dapagliflozin propanediol (FARXIGA) 10 MG TABS tablet TAKE 1 TABLET EVERY DAY (Patient taking differently: Take 10 mg by mouth daily.) 90 tablet 3   ENTRESTO 97-103 MG TAKE 1 TABLET TWICE DAILY (Patient taking differently: Take 1 tablet by mouth 2 (two) times daily.) 180 tablet 3   fluticasone (FLONASE) 50 MCG/ACT nasal spray Place 1 spray into both nostrils daily. 16 g 2   furosemide (LASIX) 20 MG tablet Take 1 tablet (20 mg total) by mouth daily. 90 tablet 3   gabapentin (NEURONTIN) 300 MG capsule Take 1 capsule (300 mg total) by mouth at bedtime. 30 capsule 0   latanoprost (XALATAN) 0.005 % ophthalmic solution Place 1 drop into both eyes at bedtime.  12   methocarbamol (ROBAXIN) 500 MG tablet TAKE 1 TABLET (500 MG TOTAL) BY MOUTH 2 (TWO) TIMES DAILY AS NEEDED FOR MUSCLE SPASMS. 30 tablet 2   Multiple Vitamin (MULTIVITAMIN WITH MINERALS) TABS tablet Take 1 tablet by mouth daily. Centrum Silver     nicotine (NICODERM CQ - DOSED IN MG/24 HR) 7 mg/24hr patch Place 1 patch (7 mg total) onto the skin daily. 28 patch 1   nitroGLYCERIN (NITROSTAT) 0.4 MG SL tablet Place 1 tablet (0.4 mg total) under the tongue every 5 (five) minutes x 3 doses as needed for chest pain. 25 tablet 1   spironolactone (ALDACTONE) 25 MG tablet Take 0.5 tablets (12.5 mg total) by mouth daily. 45 tablet 1    Review of Systems: A full ROS was attempted today and was able to be performed.  Systems assessed include - Constitutional, Eyes, HENT, Respiratory, Cardiovascular, Gastrointestinal, Genitourinary, Integument/breast, Hematologic/lymphatic, Musculoskeletal, Neurological, Behavioral/Psych, Endocrine, Allergic/Immunologic - with pertinent responses as per HPI.  Physical Examination: Temp:  [98.2 F (36.8 C)] 98.2 F (36.8 C) (06/18 1226) Pulse Rate:  [61-68] 61 (06/18 1315) Resp:  [18-26] 26 (06/18 1315) BP: (104-120)/(63-82) 104/63 (06/18 1315) SpO2:  [96 %-99 %] 96 % (06/18 1315) Weight:   [108.6 kg] 108.6 kg (06/18 1200)  General - well nourished, well developed, in no apparent distress.    Ophthalmologic - fundi not visualized due to noncooperation.    Cardiovascular - regular rhythm and rate  Neuro - awake, alert, eyes open, orientated to age, place, month but not to time. No aphasia, bradyphonia with mild dysarthria, following all simple commands. Able to name and repeat. No gaze palsy, tracking bilaterally, baseline decreased vision, seems to have left lower quadrantanopia but hard to  tell for sure due to decreased visual acuity. No significant facial droop. Tongue midline. RUE and RLE 5/5, LUE and LLE drift within 10/5 seconds, b/l FTN intact grossly, gait not tested.   NIH Stroke Scale  Level Of Consciousness 0=Alert; keenly responsive 1=Arouse to minor stimulation 2=Requires repeated stimulation to arouse or movements to pain 3=postures or unresponsive 0  LOC Questions to Month and Age 23=Answers both questions correctly 1=Answers one question correctly or dysarthria/intubated/trauma/language barrier 2=Answers neither question correctly or aphasia 1  LOC Commands      -Open/Close eyes     -Open/close grip     -Pantomime commands if communication barrier 0=Performs both tasks correctly 1=Performs one task correctly 2=Performs neighter task correctly 0  Best Gaze     -Only assess horizontal gaze 0=Normal 1=Partial gaze palsy 2=Forced deviation, or total gaze paresis 0  Visual 0=No visual loss 1=Partial hemianopia 2=Complete hemianopia 3=Bilateral hemianopia (blind including cortical blindness) 1  Facial Palsy     -Use grimace if obtunded 0=Normal symmetrical movement 1=Minor paralysis (asymmetry) 2=Partial paralysis (lower face) 3=Complete paralysis (upper and lower face) 0  Motor  0=No drift for 10/5 seconds 1=Drift, but does not hit bed 2=Some antigravity effort, hits  bed 3=No effort against gravity, limb falls 4=No movement 0=Amputation/joint fusion  Right Arm 0     Leg 0    Left Arm 1     Leg 1  Limb Ataxia     - FNT/HTS 0=Absent or does not understand or paralyzed or amputation/joint fusion 1=Present in one limb 2=Present in two limbs 0  Sensory 0=Normal 1=Mild to moderate sensory loss 2=Severe to total sensory loss or coma/unresponsive 1  Best Language 0=No aphasia, normal 1=Mild to moderate aphasia 2=Severe aphasia 3=Mute, global aphasia, or coma/unresponsive 0  Dysarthria 0=Normal 1=Mild to moderate 2=Severe, unintelligible or mute/anarthric 0=intubated/unable to test 1  Extinction/Neglect 0=No abnormality 1=visual/tactile/auditory/spatia/personal inattention/Extinction to bilateral simultaneous stimulation 2=Profound neglect/extinction more than 1 modality  0  Total   6      Data Reviewed: CT ANGIO HEAD NECK W WO CM (CODE STROKE)  Result Date: 10/02/2022 CLINICAL DATA:  Left-sided weakness and speech changes. EXAM: CT ANGIOGRAPHY HEAD AND NECK WITH AND WITHOUT CONTRAST TECHNIQUE: Multidetector CT imaging of the head and neck was performed using the standard protocol during bolus administration of intravenous contrast. Multiplanar CT image reconstructions and MIPs were obtained to evaluate the vascular anatomy. Carotid stenosis measurements (when applicable) are obtained utilizing NASCET criteria, using the distal internal carotid diameter as the denominator. RADIATION DOSE REDUCTION: This exam was performed according to the departmental dose-optimization program which includes automated exposure control, adjustment of the mA and/or kV according to patient size and/or use of iterative reconstruction technique. CONTRAST:  75mL OMNIPAQUE IOHEXOL 350 MG/ML SOLN COMPARISON:  Head and neck CTA 05/04/2022 FINDINGS: CTA NECK FINDINGS Aortic arch: Normal variant aortic arch branching pattern with common origin of the brachiocephalic and left common carotid arteries. Mild atherosclerosis without significant stenosis of the arch vessel  origins. Right carotid system: Patent without evidence of stenosis or dissection. Left carotid system: Patent without evidence of stenosis or dissection. Small volume calcified and soft plaque in the carotid bulb. Vertebral arteries: Patent without evidence of stenosis or dissection. Dominant right vertebral artery. Skeleton: Mild cervical spondylosis. Other neck: Motion artifact through the pharynx and larynx. No evidence of cervical lymphadenopathy. Upper chest: Respiratory motion without a mass or consolidation in the included lung apices. Review of the MIP images confirms the above findings CTA  HEAD FINDINGS Anterior circulation: The internal carotid arteries are patent from skull base to carotid termini with minor atherosclerosis and no significant stenosis. ACAs and MCAs are patent with mild atherosclerotic regularity but no evidence of a proximal branch occlusion or flow limiting proximal stenosis. A fenestration is noted in the distal left A1 and proximal A2 segments. No aneurysm is identified. Posterior circulation: The intracranial vertebral arteries are patent to the basilar with the left being hypoplastic distal to the PICA origin. Patent PICA and SCA origins are visualized bilaterally. The basilar artery is widely patent. There are robust posterior communicating arteries bilaterally, right larger than left, and the right P1 segment is hypoplastic or absent. Both PCAs are patent without evidence of a significant proximal stenosis. No aneurysm is identified. Venous sinuses: As permitted by contrast timing, patent. Anatomic variants: Fetal right PCA. Review of the MIP images confirms the above findings These results were communicated to Dr. Roda Shutters at 1:02 pm on 10/02/2022 by text page via the [facility] messaging system. IMPRESSION: 1. Mild atherosclerosis in the head and neck without a large vessel occlusion or significant proximal stenosis. 2.  Aortic Atherosclerosis (ICD10-I70.0). Electronically Signed   By:  Sebastian Ache M.D.   On: 10/02/2022 13:13   CT HEAD CODE STROKE WO CONTRAST  Result Date: 10/02/2022 CLINICAL DATA:  Code stroke. Left-sided weakness and speech changes. EXAM: CT HEAD WITHOUT CONTRAST TECHNIQUE: Contiguous axial images were obtained from the base of the skull through the vertex without intravenous contrast. RADIATION DOSE REDUCTION: This exam was performed according to the departmental dose-optimization program which includes automated exposure control, adjustment of the mA and/or kV according to patient size and/or use of iterative reconstruction technique. COMPARISON:  Brain MRI and CT/CTA head and neck 05/04/2022 FINDINGS: Brain: There is no evidence of acute intracranial hemorrhage, extra-axial fluid collection, or acute territorial infarct. Multiple remote infarcts are seen in the bilateral cerebral and cerebellar hemispheres. Parenchymal volume is stable. The ventricles are stable in size. The pituitary and suprasellar region are normal. There is no mass lesion. There is no mass effect or midline shift. Vascular: No hyperdense vessel or unexpected calcification. Skull: Normal. Negative for fracture or focal lesion. Sinuses/Orbits: The imaged paranasal sinuses are clear. Bilateral lens implants are in place. The globes and orbits are otherwise unremarkable. Other: The mastoid air cells and middle ear cavities are clear. ASPECTS [facility] Stroke Program Early CT Score) - Ganglionic level infarction (caudate, lentiform nuclei, internal capsule, insula, M1-M3 cortex): 7 - Supraganglionic infarction (M4-M6 cortex): Total score (0-10 with 10 being normal): 10 IMPRESSION: No acute intracranial pathology. Findings communicated to Dr Roda Shutters at 12:45 pm. Electronically Signed   By: Lesia Hausen M.D.   On: 10/02/2022 12:47    Assessment: 62 y.o. male with PMH of CAD, CHF s/p ICD, HLD, strokes, smoker presented to ED for left sided weakness, slurry speech, slow talking and feeling dizzy. LSW 9am. NIHSS =  6. CT no acute finding but multiple old infarcts. Pt within TNK window and no absolute contraindication for TNK. Discussed with pt about benefit, risk and alternative of TNK, and he agreed to proceed. He also asked me to inform his son via cell phone, however, son did not pick up the phone. I left message for him to call back. TNK given after. CTA head and neck no LVO.   Etiology for patient symptoms could be stroke given significant stroke risk factors including CHF with EF 35-40%, CAD, HLD, hx of multiple bilateral strokes, and smoker.  However, his exam did have some inconsistency vs. Lack of effect. Will do MRI at 24h of TNK for further evaluation. His ICD is MRI compatible.   Plan: Admit for routine post IV thrombolytics care  Check blood pressure and NIHSS every 15 min for 2 h, then every 30 min for 6 h, and finally every hour for 16 h BP goal < 180/105 MRI brain 24 hours post thrombolytics Stat CT head without contrast if acute neuro changes NPO until swallowing screen performed and passed No antiplatelet agents or anticoagulants (including heparin for DVT prophylaxis) in first 24 hours No Foley catheter, nasogastric tube, arterial catheter or central venous catheter for 24 hours, unless absolutely necessary Telemetry Euglycemia  Avoid hyperthermia, PRN acetaminophen DVT prophylaxis with SCDs Discussed with Dr. Wallace Cullens ED physician Stroke team will follow in am   Thank you for this consultation and allowing [place] to participate in the care of this patient.  Marvel Plan, MD PhD Stroke Neurology 10/02/2022 1:29 PM  This patient is critically ill due to code stroke and suspect acute stroke s/p TNK, CHF with low EF and at significant risk of neurological worsening, death form recurrent stroke hemorrhagic conversion, bleeding from TNK and heart failure. This patient's care requires constant monitoring of vital signs, hemodynamics, respiratory and cardiac monitoring, review of multiple databases,  neurological assessment, discussion with family, other specialists and medical decision making of high complexity. I spent 60 minutes of neurocritical care time in the care of this patient.

## 2022-10-03 ENCOUNTER — Encounter (HOSPITAL_COMMUNITY)
Admission: EM | Disposition: A | Discharge status: Skilled Nursing Facility | Payer: Self-pay | Source: Home / Self Care | Attending: Neurology

## 2022-10-03 ENCOUNTER — Inpatient Hospital Stay (HOSPITAL_COMMUNITY): Payer: Medicare PPO

## 2022-10-03 ENCOUNTER — Inpatient Hospital Stay (HOSPITAL_COMMUNITY): Payer: Medicare PPO | Admitting: Certified Registered Nurse Anesthetist

## 2022-10-03 DIAGNOSIS — I639 Cerebral infarction, unspecified: Secondary | ICD-10-CM

## 2022-10-03 DIAGNOSIS — I63512 Cerebral infarction due to unspecified occlusion or stenosis of left middle cerebral artery: Secondary | ICD-10-CM | POA: Diagnosis not present

## 2022-10-03 DIAGNOSIS — I63312 Cerebral infarction due to thrombosis of left middle cerebral artery: Secondary | ICD-10-CM | POA: Diagnosis not present

## 2022-10-03 DIAGNOSIS — I6389 Other cerebral infarction: Secondary | ICD-10-CM | POA: Diagnosis not present

## 2022-10-03 DIAGNOSIS — I110 Hypertensive heart disease with heart failure: Secondary | ICD-10-CM

## 2022-10-03 DIAGNOSIS — F17210 Nicotine dependence, cigarettes, uncomplicated: Secondary | ICD-10-CM

## 2022-10-03 DIAGNOSIS — I252 Old myocardial infarction: Secondary | ICD-10-CM

## 2022-10-03 DIAGNOSIS — I509 Heart failure, unspecified: Secondary | ICD-10-CM | POA: Diagnosis not present

## 2022-10-03 DIAGNOSIS — I2510 Atherosclerotic heart disease of native coronary artery without angina pectoris: Secondary | ICD-10-CM | POA: Diagnosis not present

## 2022-10-03 HISTORY — PX: IR CT HEAD LTD: IMG2386

## 2022-10-03 HISTORY — PX: RADIOLOGY WITH ANESTHESIA: SHX6223

## 2022-10-03 HISTORY — PX: IR US GUIDE VASC ACCESS RIGHT: IMG2390

## 2022-10-03 HISTORY — PX: IR PERCUTANEOUS ART THROMBECTOMY/INFUSION INTRACRANIAL INC DIAG ANGIO: IMG6087

## 2022-10-03 LAB — HEMOGLOBIN A1C
Hgb A1c MFr Bld: 5.8 % — ABNORMAL HIGH (ref 4.8–5.6)
Mean Plasma Glucose: 119.76 mg/dL

## 2022-10-03 LAB — LIPID PANEL
Cholesterol: 124 mg/dL (ref 0–200)
HDL: 48 mg/dL (ref >40)
LDL Cholesterol: 63 mg/dL (ref 0–99)
Total CHOL/HDL Ratio: 2.6 RATIO
Triglycerides: 67 mg/dL (ref <150)
VLDL: 13 mg/dL (ref 0–40)

## 2022-10-03 LAB — ECHOCARDIOGRAM COMPLETE
Area-P 1/2: 2.9 cm2
Height: 73 in
S' Lateral: 4.9 cm
Weight: 3703.73 oz

## 2022-10-03 LAB — PHOSPHORUS: Phosphorus: 2.2 mg/dL — ABNORMAL LOW (ref 2.5–4.6)

## 2022-10-03 LAB — MAGNESIUM: Magnesium: 1.9 mg/dL (ref 1.7–2.4)

## 2022-10-03 LAB — SODIUM: Sodium: 137 mmol/L (ref 135–145)

## 2022-10-03 SURGERY — IR WITH ANESTHESIA
Anesthesia: General

## 2022-10-03 MED ORDER — LIDOCAINE 2% (20 MG/ML) 5 ML SYRINGE
INTRAMUSCULAR | Status: DC | PRN
  Administered 2022-10-03: 5 mL via INTRAVENOUS

## 2022-10-03 MED ORDER — SODIUM CHLORIDE 0.9 % IV SOLN: INTRAVENOUS | Status: DC | PRN

## 2022-10-03 MED ORDER — VERAPAMIL HCL 2.5 MG/ML IV SOLN
INTRAVENOUS | Status: AC
  Filled 2022-10-03: qty 2

## 2022-10-03 MED ORDER — SODIUM CHLORIDE 3 % IV SOLN
INTRAVENOUS | Status: DC
  Filled 2022-10-03: qty 1000
  Filled 2022-10-03 (×3): qty 500

## 2022-10-03 MED ORDER — FENTANYL CITRATE (PF) 100 MCG/2ML IJ SOLN
INTRAMUSCULAR | Status: AC
  Filled 2022-10-03: qty 2

## 2022-10-03 MED ORDER — PROPOFOL 10 MG/ML IV BOLUS
INTRAVENOUS | Status: DC | PRN
  Administered 2022-10-03: 60 mg via INTRAVENOUS

## 2022-10-03 MED ORDER — ONDANSETRON HCL 4 MG/2ML IJ SOLN
INTRAMUSCULAR | Status: DC | PRN
  Administered 2022-10-03: 4 mg via INTRAVENOUS

## 2022-10-03 MED ORDER — SODIUM CHLORIDE 0.9 % IV BOLUS
500.0000 mL | Freq: Once | INTRAVENOUS | Status: AC
  Administered 2022-10-03: 500 mL via INTRAVENOUS

## 2022-10-03 MED ORDER — ROCURONIUM BROMIDE 10 MG/ML (PF) SYRINGE
PREFILLED_SYRINGE | INTRAVENOUS | Status: DC | PRN
  Administered 2022-10-03: 70 mg via INTRAVENOUS

## 2022-10-03 MED ORDER — SODIUM CHLORIDE 0.9 % IV BOLUS
250.0000 mL | INTRAVENOUS | Status: AC | PRN
  Administered 2022-10-03: 250 mL via INTRAVENOUS

## 2022-10-03 MED ORDER — VITAL AF 1.2 CAL PO LIQD
1000.0000 mL | ORAL | Status: AC
  Administered 2022-10-03: 1000 mL

## 2022-10-03 MED ORDER — IOHEXOL 300 MG/ML  SOLN
150.0000 mL | Freq: Once | INTRAMUSCULAR | Status: AC | PRN
  Administered 2022-10-03: 40 mL via INTRA_ARTERIAL

## 2022-10-03 MED ORDER — PROSOURCE TF20 ENFIT COMPATIBL EN LIQD
60.0000 mL | Freq: Every day | ENTERAL | Status: DC
  Administered 2022-10-03 – 2022-10-04 (×2): 60 mL
  Filled 2022-10-03 (×2): qty 60

## 2022-10-03 MED ORDER — ACETAMINOPHEN 160 MG/5ML PO SOLN
650.0000 mg | ORAL | Status: DC | PRN
  Administered 2022-10-03 – 2022-10-30 (×43): 650 mg
  Filled 2022-10-03 (×43): qty 20.3

## 2022-10-03 MED ORDER — CEFAZOLIN SODIUM-DEXTROSE 2-4 GM/100ML-% IV SOLN
INTRAVENOUS | Status: AC
  Filled 2022-10-03: qty 100

## 2022-10-03 MED ORDER — ORAL CARE MOUTH RINSE: 15.0000 mL | OROMUCOSAL | Status: DC | PRN

## 2022-10-03 MED ORDER — CLEVIDIPINE BUTYRATE 0.5 MG/ML IV EMUL: 0.0000 mg/h | INTRAVENOUS | Status: DC

## 2022-10-03 MED ORDER — ACETAMINOPHEN 650 MG RE SUPP: 650.0000 mg | RECTAL | Status: DC | PRN

## 2022-10-03 MED ORDER — GABAPENTIN 250 MG/5ML PO SOLN
300.0000 mg | Freq: Every day | ORAL | Status: DC
  Administered 2022-10-03 – 2022-10-30 (×28): 300 mg
  Filled 2022-10-03 (×32): qty 6

## 2022-10-03 MED ORDER — SUGAMMADEX SODIUM 200 MG/2ML IV SOLN
INTRAVENOUS | Status: DC | PRN
  Administered 2022-10-03: 200 mg via INTRAVENOUS

## 2022-10-03 MED ORDER — DEXAMETHASONE SODIUM PHOSPHATE 10 MG/ML IJ SOLN
INTRAMUSCULAR | Status: DC | PRN
  Administered 2022-10-03: 10 mg via INTRAVENOUS

## 2022-10-03 MED ORDER — FENTANYL CITRATE (PF) 250 MCG/5ML IJ SOLN
INTRAMUSCULAR | Status: DC | PRN
  Administered 2022-10-03: 100 ug via INTRAVENOUS

## 2022-10-03 MED ORDER — SODIUM CHLORIDE 3 % IV BOLUS
250.0000 mL | Freq: Once | INTRAVENOUS | Status: AC
  Administered 2022-10-03: 250 mL via INTRAVENOUS
  Filled 2022-10-03: qty 500

## 2022-10-03 MED ORDER — PHENYLEPHRINE HCL-NACL 20-0.9 MG/250ML-% IV SOLN
INTRAVENOUS | Status: DC | PRN
  Administered 2022-10-03: 30 ug/min via INTRAVENOUS

## 2022-10-03 MED ORDER — ACETAMINOPHEN 325 MG PO TABS
650.0000 mg | ORAL_TABLET | ORAL | Status: DC | PRN
  Administered 2022-10-27 – 2022-10-31 (×3): 650 mg via ORAL
  Filled 2022-10-03 (×5): qty 2

## 2022-10-03 MED ORDER — CEFAZOLIN SODIUM-DEXTROSE 2-3 GM-%(50ML) IV SOLR
INTRAVENOUS | Status: DC | PRN
  Administered 2022-10-03: 2 g via INTRAVENOUS

## 2022-10-03 NOTE — Progress Notes (Signed)
OT Cancellation Note  Patient Details Name: [name] MRN: [id number] DOB: 02/14/1961   Cancelled Treatment:    Reason Eval/Treat Not Completed: Patient not medically ready Wilfrid Lund hold today adn follow up on a later date as appropriate.)  Adabelle Griffiths,HILLARY 10/03/2022, 2:28 PM Luisa Dago, OT/L   Acute OT Clinical Specialist Acute Rehabilitation Services Pager [phone] Office [phone]

## 2022-10-03 NOTE — Evaluation (Signed)
Clinical/Bedside Swallow Evaluation Patient Details  Name: [name] MRN: [id number] Date of Birth: 01/07/1961  Today's Date: 10/03/2022 Time: SLP Start Time (ACUTE ONLY): 1345 SLP Stop Time (ACUTE ONLY): 1400 SLP Time Calculation (min) (ACUTE ONLY): 15 min  Past Medical History:  Past Medical History:  Diagnosis Date   Abnormal EKG    hx of ischemia showing up on ekg's   Acute myopericarditis    a. 03/2013: readm with hypoxia, tachycardia, elevated ESR/CRP, elevated troponin with Dressler syndrome and myopericarditis; treated with steroids.   Acute respiratory failure (HCC)    a. 01/2013: VDRF. b. 03/2013: hypoxia requiring supp O2 during adm, resolved by discharge.   C. difficile colitis    a. 01/2013 during prolonged adm. b. Recurred 03/2013.   Cardiac tamponade    a. 01/2013 s/p drain.   Cataracts, bilateral    Coronary artery disease    a. s/p MI in 2009 in [place] with stenting of the LCX and LAD;  b. 12/2012 NSTEMI/CAD: LM nl, LAD patent prox stent, LCX 50-70 isr (FFR 0.84), RCA dom, 161m, EF 40-45%-->Med Rx. c. 01/2013: anterolateral STEMI complicated by pericardial effusion (presumed purulent pericarditis) and tamponade s/p drain, ruptured LV pseudoaneurysm s/p CorMatrix patch, CABGx1 (SVG-OM1), fever, CVA, VDRF, C diff.   CVA (cerebral infarction)    a. 01/2013 in setting of prolonged hospitalization - residual L arm weakness.   Dressler syndrome (HCC)    a. 03/2013: readm with hypoxia, tachycardia, elevated ESR/CRP, elevated troponin with Dressler syndrome and myopericarditis; treated with steroids.   Glaucoma    Hyperlipidemia    Hypokalemia    Hyponatremia    a. During late 2014.   Ischemic cardiomyopathy    a. Sept/Oct 2014: EF ~40% (ICM). b. 03/2013: EF 25-30%. Off ACEI due to hypotension (MIXED NICM/ICM).   Optic neuropathy, ischemic    Pericardial effusion    a. 01/2013: pericardial effusion (presumed purulent pericarditis) and cardiac tamponade s/p drain. b.  Persistent moderate pericardial effusion 03/2013.   Pre-diabetes    Pseudoaneurysm of left ventricle of heart    a. 01/2013:  Ruptured inferoposterior LV pseudoaneurysm s/p CorMatrix patch.   Sinus bradycardia    asymptomatic   Stroke [facility]) 2009   weak lt side-lt arm   Tobacco abuse    Wears dentures    top   Past Surgical History:  Past Surgical History:  Procedure Laterality Date   CARDIAC CATHETERIZATION  01/06/2013   CATARACT EXTRACTION Bilateral 05/2021   COLONOSCOPY WITH PROPOFOL N/A 07/03/2016   Procedure: COLONOSCOPY WITH PROPOFOL;  Surgeon: Ruffin Frederick, MD;  Location: [facility];  Service: Gastroenterology;  Laterality: N/A;   CORONARY ANGIOPLASTY WITH STENT PLACEMENT  2000's X 2   "1 + 1" (01/06/2013)   CORONARY ARTERY BYPASS GRAFT N/A 01/28/2013   Procedure: CORONARY ARTERY BYPASS GRAFTING (CABG);  Surgeon: Loreli Slot, MD;  Location: [facility] OR;  Service: Open Heart Surgery;  Laterality: N/A;  CABG x one, using left greater saphenous vein harvested endoscopically   ICD IMPLANT N/A 05/25/2020   Procedure: ICD IMPLANT;  Surgeon: Hillis Range, MD;  Location: [facility] INVASIVE CV LAB;  Service: Cardiovascular;  Laterality: N/A;   INTRAOPERATIVE TRANSESOPHAGEAL ECHOCARDIOGRAM N/A 01/28/2013   Procedure: INTRAOPERATIVE TRANSESOPHAGEAL ECHOCARDIOGRAM;  Surgeon: Loreli Slot, MD;  Location: [facility] OR;  Service: Open Heart Surgery;  Laterality: N/A;   IR CT HEAD LTD  10/03/2022   IR PERCUTANEOUS ART THROMBECTOMY/INFUSION INTRACRANIAL INC DIAG ANGIO  10/03/2022   IR US GUIDE VASC ACCESS  RIGHT  10/03/2022   LEFT HEART CATH AND CORS/GRAFTS ANGIOGRAPHY N/A 09/13/2020   Procedure: LEFT HEART CATH AND CORS/GRAFTS ANGIOGRAPHY;  Surgeon: Swaziland, Peter M, MD;  Location: [facility] INVASIVE CV LAB;  Service: Cardiovascular;  Laterality: N/A;   LEFT HEART CATHETERIZATION WITH CORONARY ANGIOGRAM N/A 01/06/2013   Procedure: LEFT HEART CATHETERIZATION WITH CORONARY ANGIOGRAM;  Surgeon:  Peter M Swaziland, MD;  Location: [facility] CATH LAB;  Service: Cardiovascular;  Laterality: N/A;   LEFT HEART CATHETERIZATION WITH CORONARY ANGIOGRAM N/A 01/21/2013   Procedure: LEFT HEART CATHETERIZATION WITH CORONARY ANGIOGRAM;  Surgeon: Kathleene Hazel, MD;  Location: [facility] CATH LAB;  Service: Cardiovascular;  Laterality: N/A;   LUMBAR LAMINECTOMY/ DECOMPRESSION WITH MET-RX Right 06/30/2018   Procedure: Right minimally invasive Lumbar Three-Four Far lateral discectomy;  Surgeon: Jadene Pierini, MD;  Location: [facility] OR;  Service: Neurosurgery;  Laterality: Right;  Right minimally invasive Lumbar Three-Four Far lateral discectomy   MASS EXCISION Right 07/21/2014   Procedure: EXCISION RIGHT NECK MASS;  Surgeon: Harriette Bouillon, MD;  Location: [facility];  Service: General;  Laterality: Right;   PERICARDIAL TAP N/A 01/24/2013   Procedure: PERICARDIAL TAP;  Surgeon: Micheline Chapman, MD;  Location: [facility] CATH LAB;  Service: Cardiovascular;  Laterality: N/A;   RIGHT HEART CATHETERIZATION Right 01/24/2013   Procedure: RIGHT HEART CATH;  Surgeon: Micheline Chapman, MD;  Location: [facility] CATH LAB;  Service: Cardiovascular;  Laterality: Right;   VENTRICULAR ANEURYSM RESECTION N/A 01/28/2013   Procedure: LEFT VENTRICULAR ANEURYSM REPAIR;  Surgeon: Loreli Slot, MD;  Location: [facility] OR;  Service: Open Heart Surgery;  Laterality: N/A;   HPI:  [name] is a 62 y.o. African American male with PMH of CAD, CHF s/p ICD, HLD, strokes, smoker presented to ED for code stroke. Progressed hemorrhage in the left frontal parietal region with 3 cm  hematoma and regional subarachnoid hemorrhage. Probable underlying  cortex infarct.  Underwent DIAGNOSTIC CEREBRAL ANGIOGRAM AND MECHANICAL THROMBECTOMY on 6/19 due to worsening of symptoms. F?u MRI also shows Small acute right cerebellar infarct. Pt has a history of unexplained gagging in 2016. Had MBS, SLP recommended chin tuck?    Assessment / Plan /  Recommendation  Clinical Impression  Pt demonstrates attention to PO and responds with oral manipulation and eventual swallow response with ice and puree.  But oral dysphagia appears severe and trials of ice are consistently followed by coughing and sneezing. Puree better tolerated. Pt dysarthic with potential CN X deficits given abnormal resonance. Recommend NPO with alternate means. WIll f/u tomorrow for MBS if possible SLP Visit Diagnosis: Dysphagia, oropharyngeal phase (R13.12)    Aspiration Risk       Diet Recommendation NPO;Alternative means - temporary        Other  Recommendations      Recommendations for follow up therapy are one component of a multi-disciplinary discharge planning process, led by the attending physician.  Recommendations may be updated based on patient status, additional functional criteria and insurance authorization.  Follow up Recommendations        Assistance Recommended at Discharge    Functional Status Assessment    Frequency and Duration            Prognosis Prognosis for improved oropharyngeal function: Fair      Swallow Study   General HPI: [name] is a 63 y.o. African American male with PMH of CAD, CHF s/p ICD, HLD, strokes, smoker presented to ED for code stroke. Progressed hemorrhage in  the left frontal parietal region with 3 cm  hematoma and regional subarachnoid hemorrhage. Probable underlying  cortex infarct.  Underwent DIAGNOSTIC CEREBRAL ANGIOGRAM AND MECHANICAL THROMBECTOMY on 6/19 due to worsening of symptoms. F?u MRI also shows Small acute right cerebellar infarct. Pt has a history of unexplained gagging in 2016. Had MBS, SLP recommended chin tuck? Type of Study: Bedside Swallow Evaluation Previous Swallow Assessment: see HPI Diet Prior to this Study: NPO Temperature Spikes Noted: No Respiratory Status: Room air History of Recent Intubation: No Behavior/Cognition: Alert;Cooperative;Requires cueing Oral Cavity Assessment:  Within Functional Limits Oral Care Completed by SLP: No Oral Cavity - Dentition: Dentures, top;Dentures, bottom;Edentulous Vision: Functional for self-feeding Self-Feeding Abilities: Total assist Patient Positioning: Upright in bed Baseline Vocal Quality: Suspected CN X (Vagus) involvement Volitional Cough: Cognitively unable to elicit Volitional Swallow: Unable to elicit    Oral/Motor/Sensory Function Overall Oral Motor/Sensory Function: Severe impairment Facial ROM: Reduced right;Suspected CN VII (facial) dysfunction Facial Symmetry: Abnormal symmetry right;Suspected CN VII (facial) dysfunction Facial Strength: Reduced right;Suspected CN VII (facial) dysfunction Lingual ROM: Suspected CN XII (hypoglossal) dysfunction (didnt follow commands) Velum: Suspected CN X (Vagus) dysfunction   Ice Chips Ice chips: Impaired Presentation: Spoon Oral Phase Impairments: Reduced labial seal;Reduced lingual movement/coordination;Impaired mastication Oral Phase Functional Implications: Prolonged oral transit Pharyngeal Phase Impairments: Cough - Immediate (sneeze)   Thin Liquid Thin Liquid: Not tested    Nectar Thick Nectar Thick Liquid: Not tested   Honey Thick Honey Thick Liquid: Not tested   Puree Puree: Impaired Presentation: Spoon Oral Phase Impairments: Poor awareness of bolus;Reduced labial seal;Reduced lingual movement/coordination Oral Phase Functional Implications: Prolonged oral transit;Oral residue   Solid     Solid: Not tested      [name] 10/03/2022,2:13 PM

## 2022-10-03 NOTE — Progress Notes (Signed)
0715 Neuro change, patient now experiencing dysarthria and RUE weakness and neglect. Patient is still moving all extremities, experiencing expressive aphasia and answers yes and no orientation questions. Pupils are 3s and reactive. Dayshift Neuro/Stroke MD notified.

## 2022-10-03 NOTE — Progress Notes (Signed)
BP 109/72. MD (IR MD and Neurologist) notified. OK for BP to be between 100-140. No pressors indicated at this time.

## 2022-10-03 NOTE — Anesthesia Postprocedure Evaluation (Signed)
Anesthesia Post Note  Patient: [name]  Procedure(s) Performed: IR WITH ANESTHESIA     Patient location during evaluation: PACU Anesthesia Type: General Level of consciousness: sedated Pain management: pain level controlled Vital Signs Assessment: post-procedure vital signs reviewed and stable Respiratory status: spontaneous breathing and respiratory function stable Cardiovascular status: stable Postop Assessment: no apparent nausea or vomiting Anesthetic complications: no  No notable events documented.  Last Vitals:  Vitals:   10/03/22 0430 10/03/22 0434  BP: 104/66 104/66  Pulse: (!) 57 (!) 54  Resp: 19 18  Temp:    SpO2: 97% 96%    Last Pain:  Vitals:   10/03/22 0400  TempSrc: Oral  PainSc: 0-No pain                 Shalene Gallen DANIEL

## 2022-10-03 NOTE — TOC CM/SW Note (Signed)
Transition of Care [facility]) - Inpatient Brief Assessment   Patient Details  Name: RINO TWILLEY MRN: [id number] Date of Birth: 16-Apr-1961  [facility]) CM/SW Contact:    Mearl Latin, LCSW Phone Number: 10/03/2022, 5:21 PM   Clinical Narrative: Patient admitted from home with stroke. No current TOC needs identified but please consult TOC if needs arise as patient progresses.    Transition of Care Asessment: Insurance and Status: Insurance coverage has been reviewed Patient has primary care physician: Yes Home environment has been reviewed: From home Prior level of function:: Independent Prior/Current Home Services: No current home services Social Determinants of Health Reivew: SDOH reviewed no interventions necessary Readmission risk has been reviewed: Yes Transition of care needs: no transition of care needs at this time

## 2022-10-03 NOTE — Progress Notes (Signed)
Referring Physician(s): CODE STROKE  Supervising Physician: Baldemar Lenis  Patient Status:  [facility] - In-pt  Chief Complaint: CODE STROKE  Subjective: Patient resting comfortably in bed this AM.  Following commands accurately and attempting to communicate.  Able to determine he is hungry and asking to eat.   Allergies: Patient has no known allergies.  Medications: Prior to Admission medications   Medication Sig Start Date End Date Taking? Authorizing Provider  ALPHAGAN P 0.1 % SOLN INSTILL 1 DROP INTO BOTH EYES TWICE A DAY Patient taking differently: Place 1 drop into both eyes in the morning and at bedtime. 10/26/19   [name], MD  aspirin 81 MG EC tablet Take 1 tablet (81 mg total) by mouth daily. Restart on 07/05/2018 06/30/18   Jadene Pierini, MD  atorvastatin (LIPITOR) 80 MG tablet Take 1 tablet (80 mg total) by mouth daily. 06/07/22   [name], MD  carvedilol (COREG) 25 MG tablet Take 0.5 tablets (12.5 mg total) by mouth 2 (two) times daily with a meal. 05/08/22   [name], MD  cetirizine (ZYRTEC) 10 MG tablet Take 1 tablet (10 mg total) by mouth daily. 08/12/21   Carlisle Beers, FNP  Cholecalciferol (VITAMIN D) 125 MCG (5000 UT) CAPS Take 5,000 Units by mouth daily.    [provider]  clopidogrel (PLAVIX) 75 MG tablet Take 1 tablet (75 mg total) by mouth daily. 06/20/22   [name], MD  dapagliflozin propanediol (FARXIGA) 10 MG TABS tablet TAKE 1 TABLET EVERY DAY Patient taking differently: Take 10 mg by mouth daily. 12/20/21   Allred, Fayrene Fearing, MD  ENTRESTO 97-103 MG TAKE 1 TABLET TWICE DAILY Patient taking differently: Take 1 tablet by mouth 2 (two) times daily. 05/02/22   [place], Peter M, MD  fluticasone [facility]) 50 MCG/ACT nasal spray Place 1 spray into both nostrils daily. 08/12/21   Carlisle Beers, FNP  furosemide (LASIX) 20 MG tablet Take 1 tablet (20 mg total) by mouth daily. 02/01/21   [place],  Peter M, MD  gabapentin (NEURONTIN) 300 MG capsule Take 1 capsule (300 mg total) by mouth at bedtime. 09/18/21   [name], MD  latanoprost (XALATAN) 0.005 % ophthalmic solution Place 1 drop into both eyes at bedtime. 05/31/16   [provider]  methocarbamol (ROBAXIN) 500 MG tablet TAKE 1 TABLET (500 MG TOTAL) BY MOUTH 2 (TWO) TIMES DAILY AS NEEDED FOR MUSCLE SPASMS. 10/18/20   [name], MD  Multiple Vitamin (MULTIVITAMIN WITH MINERALS) TABS tablet Take 1 tablet by mouth daily. Centrum Silver    [provider]  nicotine (NICODERM CQ - DOSED IN MG/24 HR) 7 mg/24hr patch Place 1 patch (7 mg total) onto the skin daily. 05/08/22   [name], MD  nitroGLYCERIN (NITROSTAT) 0.4 MG SL tablet Place 1 tablet (0.4 mg total) under the tongue every 5 (five) minutes x 3 doses as needed for chest pain. 09/14/20   Arty Baumgartner, NP  spironolactone (ALDACTONE) 25 MG tablet Take 0.5 tablets (12.5 mg total) by mouth daily. 05/08/22   [name], MD     Vital Signs: BP 119/82   Pulse (!) 57   Temp 97.7 F (36.5 C) (Oral)   Resp 19   Ht 6\' 1"  (1.854 m)   Wt 231 lb 7.7 oz (105 kg)   SpO2 94%   BMI 30.54 kg/m   Physical Exam NAD, alert Neuro: alert, following commands.  Moving left-side  without weakness, R arm with strength intact shoulder to forearm, only flicker noted in wrist/hand. Attempting to communicate with broken speech and poor articulation but intermittently intelligible.  Groin: procedure site intact, clean, dry. Soft.  No evidence of hematoma or pseudoaneurysm Pulses: R DP 2+  Imaging: IR PERCUTANEOUS ART THROMBECTOMY/INFUSION INTRACRANIAL INC DIAG ANGIO  Result Date: 10/03/2022 INDICATION: 62 year old male who presented to emergency on 10/02/2022 with left-sided weakness, slurred speech and dizziness; NIHSS 6. Head CT was negative for acute infarct and CTA was negative for large vessel occlusion. Patient received IV TNK. Patient later  throughout the day developed stuttering aphasia with NIH fluctuating between 0-4. Given transient nature of symptoms, family declined intervention. However, around 1 a.m. on 10/03/2022, patient had a more prolonged episode of aphasia, NIHSS 8. At that time, family decided to proceed with intervention. This past medical history significant for CAD, CHF status post ICD, hyperlipidemia and multiple prior strokes. EXAM: ULTRASOUND-GUIDED VASCULAR ACCESS DIAGNOSTIC CEREBRAL ANGIOGRAM MECHANICAL THROMBECTOMY FLAT PANEL HEAD CT COMPARISON:  CT angiogram of the head and neck October 02, 2022. MEDICATIONS: No antibiotics utilized. ANESTHESIA/SEDATION: The procedure was performed under general anaesthesia. CONTRAST:  40 mL of Omnipaque 300 milligram/mL FLUOROSCOPY: Radiation Exposure Index (as provided by the fluoroscopic device): 618 mGy Kerma COMPLICATIONS: SIR Level A - No therapy, no consequence. TECHNIQUE: Informed written consent was obtained from the patient's son after a thorough discussion of the procedural risks, benefits and alternatives. All questions were addressed. Maximal Sterile Barrier Technique was utilized including caps, mask, sterile gowns, sterile gloves, sterile drape, hand hygiene and skin antiseptic. A timeout was performed prior to the initiation of the procedure. The right groin was prepped and draped in the usual sterile fashion. Using a micropuncture kit and the modified Seldinger technique, access was gained to the right common femoral artery and an 8 French sheath was placed. Real-time ultrasound guidance was utilized for vascular access including the acquisition of a permanent ultrasound image documenting patency of the accessed vessel. Under fluoroscopy, a Zoom 88 guide catheter was navigated over a 6 [place] Berenstein 2 catheter and a 0.035" Terumo Glidewire into the aortic arch. The catheter was placed into the left common carotid artery and then advanced into the left internal carotid artery.  The diagnostic catheter was removed. Frontal and lateral angiograms of the head were obtained. FINDINGS: 1. Normal caliber of the right common femoral artery, adequate for vascular access. 2. Occlusion of a left M3/MCA posterior division branch. PROCEDURE: Using biplane roadmap guidance, a Zoom 55 aspiration catheter was navigated over Colossus 35 microguidewire into the cavernous segment of the left ICA. The aspiration catheter was then advanced to the level of occlusion in the M3 segment and connected to an aspiration pump. Continuous aspiration was performed for 2 minutes. The guide catheter was connected to a VacLok syringe. The aspiration catheter was subsequently removed under constant aspiration. The guide catheter was aspirated for debris. Left internal carotid artery angiograms with frontal and lateral views of the head showed recanalization of the M3 segment with slow flow in the distal M3-M4 segment related to subocclusive filling defect. Using biplane roadmap guidance, a Zoom 55 aspiration catheter was navigated over Colossus 35 microguidewire into the cavernous segment of the left ICA. The aspiration catheter was then advanced to the the M2 segment in the micro guidewire was advanced to level of occlusion. The wire and aspiration catheter was then removed. No thrombectomy pass performed. Left internal carotid artery angiograms with frontal and lateral views of  the head showed complete patency of the left MCA vascular tree (TICI3). No evidence of active contrast extravasation. Flat panel CT of the head was obtained and post processed in a separate workstation with concurrent attending physician supervision. Selected images were sent to PACS. Small hyperdensity in the posterior aspect of the left sylvian fissure may represent contrast versus subarachnoid hemorrhage. The catheter was subsequently withdrawn. Right common femoral artery angiogram was obtained in right anterior oblique view. The puncture is  at the level of the common femoral artery. The artery has normal caliber, adequate for closure device. The sheath was exchanged over the wire for a Perclose prostyle which was utilized for access closure. Immediate hemostasis was achieved. IMPRESSION: 1. Successful mechanical thrombectomy performed for treatment of a left M3/MCA occlusion achieving complete recanalization (TICI 3). 2. Hyperdensity within the left sylvian fissure may represent small subarachnoid hemorrhage versus contrast. PLAN: 1. Transferred to ICU for observation. 2. Follow-up CT within 3 hours. Electronically Signed   By: Baldemar Lenis M.D.   On: 10/03/2022 09:31   IR US Guide Vasc Access Right  Result Date: 10/03/2022 INDICATION: 62 year old male who presented to emergency on 10/02/2022 with left-sided weakness, slurred speech and dizziness; NIHSS 6. Head CT was negative for acute infarct and CTA was negative for large vessel occlusion. Patient received IV TNK. Patient later throughout the day developed stuttering aphasia with NIH fluctuating between 0-4. Given transient nature of symptoms, family declined intervention. However, around 1 a.m. on 10/03/2022, patient had a more prolonged episode of aphasia, NIHSS 8. At that time, family decided to proceed with intervention. This past medical history significant for CAD, CHF status post ICD, hyperlipidemia and multiple prior strokes. EXAM: ULTRASOUND-GUIDED VASCULAR ACCESS DIAGNOSTIC CEREBRAL ANGIOGRAM MECHANICAL THROMBECTOMY FLAT PANEL HEAD CT COMPARISON:  CT angiogram of the head and neck October 02, 2022. MEDICATIONS: No antibiotics utilized. ANESTHESIA/SEDATION: The procedure was performed under general anaesthesia. CONTRAST:  40 mL of Omnipaque 300 milligram/mL FLUOROSCOPY: Radiation Exposure Index (as provided by the fluoroscopic device): 618 mGy Kerma COMPLICATIONS: SIR Level A - No therapy, no consequence. TECHNIQUE: Informed written consent was obtained from the patient's  son after a thorough discussion of the procedural risks, benefits and alternatives. All questions were addressed. Maximal Sterile Barrier Technique was utilized including caps, mask, sterile gowns, sterile gloves, sterile drape, hand hygiene and skin antiseptic. A timeout was performed prior to the initiation of the procedure. The right groin was prepped and draped in the usual sterile fashion. Using a micropuncture kit and the modified Seldinger technique, access was gained to the right common femoral artery and an 8 French sheath was placed. Real-time ultrasound guidance was utilized for vascular access including the acquisition of a permanent ultrasound image documenting patency of the accessed vessel. Under fluoroscopy, a Zoom 88 guide catheter was navigated over a 6 [place] Berenstein 2 catheter and a 0.035" Terumo Glidewire into the aortic arch. The catheter was placed into the left common carotid artery and then advanced into the left internal carotid artery. The diagnostic catheter was removed. Frontal and lateral angiograms of the head were obtained. FINDINGS: 1. Normal caliber of the right common femoral artery, adequate for vascular access. 2. Occlusion of a left M3/MCA posterior division branch. PROCEDURE: Using biplane roadmap guidance, a Zoom 55 aspiration catheter was navigated over Colossus 35 microguidewire into the cavernous segment of the left ICA. The aspiration catheter was then advanced to the level of occlusion in the M3 segment and connected to an aspiration  pump. Continuous aspiration was performed for 2 minutes. The guide catheter was connected to a VacLok syringe. The aspiration catheter was subsequently removed under constant aspiration. The guide catheter was aspirated for debris. Left internal carotid artery angiograms with frontal and lateral views of the head showed recanalization of the M3 segment with slow flow in the distal M3-M4 segment related to subocclusive filling defect. Using  biplane roadmap guidance, a Zoom 55 aspiration catheter was navigated over Colossus 35 microguidewire into the cavernous segment of the left ICA. The aspiration catheter was then advanced to the the M2 segment in the micro guidewire was advanced to level of occlusion. The wire and aspiration catheter was then removed. No thrombectomy pass performed. Left internal carotid artery angiograms with frontal and lateral views of the head showed complete patency of the left MCA vascular tree (TICI3). No evidence of active contrast extravasation. Flat panel CT of the head was obtained and post processed in a separate workstation with concurrent attending physician supervision. Selected images were sent to PACS. Small hyperdensity in the posterior aspect of the left sylvian fissure may represent contrast versus subarachnoid hemorrhage. The catheter was subsequently withdrawn. Right common femoral artery angiogram was obtained in right anterior oblique view. The puncture is at the level of the common femoral artery. The artery has normal caliber, adequate for closure device. The sheath was exchanged over the wire for a Perclose prostyle which was utilized for access closure. Immediate hemostasis was achieved. IMPRESSION: 1. Successful mechanical thrombectomy performed for treatment of a left M3/MCA occlusion achieving complete recanalization (TICI 3). 2. Hyperdensity within the left sylvian fissure may represent small subarachnoid hemorrhage versus contrast. PLAN: 1. Transferred to ICU for observation. 2. Follow-up CT within 3 hours. Electronically Signed   By: Baldemar Lenis M.D.   On: 10/03/2022 09:31   IR CT Head Ltd  Result Date: 10/03/2022 INDICATION: 62 year old male who presented to emergency on 10/02/2022 with left-sided weakness, slurred speech and dizziness; NIHSS 6. Head CT was negative for acute infarct and CTA was negative for large vessel occlusion. Patient received IV TNK. Patient later  throughout the day developed stuttering aphasia with NIH fluctuating between 0-4. Given transient nature of symptoms, family declined intervention. However, around 1 a.m. on 10/03/2022, patient had a more prolonged episode of aphasia, NIHSS 8. At that time, family decided to proceed with intervention. This past medical history significant for CAD, CHF status post ICD, hyperlipidemia and multiple prior strokes. EXAM: ULTRASOUND-GUIDED VASCULAR ACCESS DIAGNOSTIC CEREBRAL ANGIOGRAM MECHANICAL THROMBECTOMY FLAT PANEL HEAD CT COMPARISON:  CT angiogram of the head and neck October 02, 2022. MEDICATIONS: No antibiotics utilized. ANESTHESIA/SEDATION: The procedure was performed under general anaesthesia. CONTRAST:  40 mL of Omnipaque 300 milligram/mL FLUOROSCOPY: Radiation Exposure Index (as provided by the fluoroscopic device): 618 mGy Kerma COMPLICATIONS: SIR Level A - No therapy, no consequence. TECHNIQUE: Informed written consent was obtained from the patient's son after a thorough discussion of the procedural risks, benefits and alternatives. All questions were addressed. Maximal Sterile Barrier Technique was utilized including caps, mask, sterile gowns, sterile gloves, sterile drape, hand hygiene and skin antiseptic. A timeout was performed prior to the initiation of the procedure. The right groin was prepped and draped in the usual sterile fashion. Using a micropuncture kit and the modified Seldinger technique, access was gained to the right common femoral artery and an 8 French sheath was placed. Real-time ultrasound guidance was utilized for vascular access including the acquisition of a permanent ultrasound image documenting  patency of the accessed vessel. Under fluoroscopy, a Zoom 88 guide catheter was navigated over a 6 [place] Berenstein 2 catheter and a 0.035" Terumo Glidewire into the aortic arch. The catheter was placed into the left common carotid artery and then advanced into the left internal carotid artery.  The diagnostic catheter was removed. Frontal and lateral angiograms of the head were obtained. FINDINGS: 1. Normal caliber of the right common femoral artery, adequate for vascular access. 2. Occlusion of a left M3/MCA posterior division branch. PROCEDURE: Using biplane roadmap guidance, a Zoom 55 aspiration catheter was navigated over Colossus 35 microguidewire into the cavernous segment of the left ICA. The aspiration catheter was then advanced to the level of occlusion in the M3 segment and connected to an aspiration pump. Continuous aspiration was performed for 2 minutes. The guide catheter was connected to a VacLok syringe. The aspiration catheter was subsequently removed under constant aspiration. The guide catheter was aspirated for debris. Left internal carotid artery angiograms with frontal and lateral views of the head showed recanalization of the M3 segment with slow flow in the distal M3-M4 segment related to subocclusive filling defect. Using biplane roadmap guidance, a Zoom 55 aspiration catheter was navigated over Colossus 35 microguidewire into the cavernous segment of the left ICA. The aspiration catheter was then advanced to the the M2 segment in the micro guidewire was advanced to level of occlusion. The wire and aspiration catheter was then removed. No thrombectomy pass performed. Left internal carotid artery angiograms with frontal and lateral views of the head showed complete patency of the left MCA vascular tree (TICI3). No evidence of active contrast extravasation. Flat panel CT of the head was obtained and post processed in a separate workstation with concurrent attending physician supervision. Selected images were sent to PACS. Small hyperdensity in the posterior aspect of the left sylvian fissure may represent contrast versus subarachnoid hemorrhage. The catheter was subsequently withdrawn. Right common femoral artery angiogram was obtained in right anterior oblique view. The puncture is  at the level of the common femoral artery. The artery has normal caliber, adequate for closure device. The sheath was exchanged over the wire for a Perclose prostyle which was utilized for access closure. Immediate hemostasis was achieved. IMPRESSION: 1. Successful mechanical thrombectomy performed for treatment of a left M3/MCA occlusion achieving complete recanalization (TICI 3). 2. Hyperdensity within the left sylvian fissure may represent small subarachnoid hemorrhage versus contrast. PLAN: 1. Transferred to ICU for observation. 2. Follow-up CT within 3 hours. Electronically Signed   By: Baldemar Lenis M.D.   On: 10/03/2022 09:31   CT HEAD WO CONTRAST  Result Date: 10/03/2022 CLINICAL DATA:  Stroke follow-up, evaluate for bleed progression. EXAM: CT HEAD WITHOUT CONTRAST TECHNIQUE: Contiguous axial images were obtained from the base of the skull through the vertex without intravenous contrast. RADIATION DOSE REDUCTION: This exam was performed according to the departmental dose-optimization program which includes automated exposure control, adjustment of the mA and/or kV according to patient size and/or use of iterative reconstruction technique. COMPARISON:  Intraoperative head CT. FINDINGS: Brain: Progression of hemorrhage at the high left frontal parietal junction with rounded hematoma presumably in in the parenchyma, measuring up to 3 cm in diameter. Regional subarachnoid hemorrhage is also increased at the level of the sylvian fissure and postcentral gyrus. On sagittal reformats the underlying parenchyma of the parietal and posterior frontal cortex appears edematous, presumably from cytotoxic edema. Numerous chronic cortical and cerebellar infarcts centered in the posterior brain. Vascular: No  hyperdense vessel or unexpected calcification. Skull: Negative Sinuses/Orbits: Negative Dr. Derry Lory is already aware as confirmed by telephone 6:10 am on 10/03/2022 IMPRESSION: Progressed hemorrhage  in the left frontal parietal region with 3 cm hematoma and regional subarachnoid hemorrhage. Probable underlying cortex infarct. Numerous chronic infarcts. Electronically Signed   By: Tiburcio Pea M.D.   On: 10/03/2022 06:15   CT HEAD WO CONTRAST ( )  Result Date: 10/02/2022 CLINICAL DATA:  Aphasia EXAM: CT HEAD WITHOUT CONTRAST CT ANGIOGRAPHY OF THE HEAD AND NECK TECHNIQUE: Contiguous axial images were obtained from the base of the skull through the vertex without intravenous contrast. Multidetector CT imaging of the head and neck was performed using the standard protocol during bolus administration of intravenous contrast. Multiplanar CT image reconstructions and MIPs were obtained to evaluate the vascular anatomy. Carotid stenosis measurements (when applicable) are obtained utilizing NASCET criteria, using the distal internal carotid diameter as the denominator. RADIATION DOSE REDUCTION: This exam was performed according to the departmental dose-optimization program which includes automated exposure control, adjustment of the mA and/or kV according to patient size and/or use of iterative reconstruction technique. COMPARISON:  CTA head and neck 10/02/2022 at 12:50 p.m. FINDINGS: CT HEAD FINDINGS Brain: There is no mass, hemorrhage or extra-axial collection. Multiple areas of supratentorial gliosis and encephalomalacia, affecting the bilateral frontal, parietal and occipital lobes. Multiple old cerebellar infarcts. Skull: The visualized skull base, calvarium and extracranial soft tissues are normal. Sinuses/Orbits: No fluid levels or advanced mucosal thickening of the visualized paranasal sinuses. No mastoid or middle ear effusion. The orbits are normal. CTA NECK FINDINGS SKELETON: There is no bony spinal canal stenosis. No lytic or blastic lesion. OTHER NECK: Normal pharynx, larynx and major salivary glands. No cervical lymphadenopathy. Unremarkable thyroid gland. UPPER CHEST: No pneumothorax or pleural  effusion. No nodules or masses. AORTIC ARCH: There is calcific atherosclerosis of the aortic arch. There is no aneurysm, dissection or hemodynamically significant stenosis of the visualized portion of the aorta. Conventional 3 vessel aortic branching pattern. The visualized proximal subclavian arteries are widely patent. RIGHT CAROTID SYSTEM: Normal without aneurysm, dissection or stenosis. LEFT CAROTID SYSTEM: No dissection, occlusion or aneurysm. Mild atherosclerotic calcification at the carotid bifurcation without hemodynamically significant stenosis. VERTEBRAL ARTERIES: Poor visualization of the proximal vertebral arteries due to streak artifacts from contrast bolus but no visible abnormality. CTA HEAD FINDINGS POSTERIOR CIRCULATION: --Vertebral arteries: Normal V4 segments. --Inferior cerebellar arteries: Normal. --Basilar artery: Normal. --Superior cerebellar arteries: Normal. --Posterior cerebral arteries (PCA): Normal. ANTERIOR CIRCULATION: --Intracranial internal carotid arteries: Normal. --Anterior cerebral arteries (ACA): Normal. Both A1 segments are present. Patent anterior communicating artery (a-comm). --Middle cerebral arteries (MCA): There is a new short segment occlusion of the distal left MCA M2 segment (series 9, images 120 9-135 and series 11, image 32). The right MCA is normal. VENOUS SINUSES: As permitted by contrast timing, patent. ANATOMIC VARIANTS: None Review of the MIP images confirms the above findings. IMPRESSION: Compared to the scan from earlier today, there is a new short segment occlusion of the distal left MCA M2 segment. These results were called by telephone at the time of interpretation on 10/02/2022 at 8:43 pm to provider [facility] , who verbally acknowledged these results. Electronically Signed   By: [name] M.D.   On: 10/02/2022 20:44   CT ANGIO HEAD NECK W WO CM  Result Date: 10/02/2022 CLINICAL DATA:  Aphasia EXAM: CT HEAD WITHOUT CONTRAST CT ANGIOGRAPHY OF  THE HEAD AND NECK TECHNIQUE: Contiguous axial images were obtained from  the base of the skull through the vertex without intravenous contrast. Multidetector CT imaging of the head and neck was performed using the standard protocol during bolus administration of intravenous contrast. Multiplanar CT image reconstructions and MIPs were obtained to evaluate the vascular anatomy. Carotid stenosis measurements (when applicable) are obtained utilizing NASCET criteria, using the distal internal carotid diameter as the denominator. RADIATION DOSE REDUCTION: This exam was performed according to the departmental dose-optimization program which includes automated exposure control, adjustment of the mA and/or kV according to patient size and/or use of iterative reconstruction technique. COMPARISON:  CTA head and neck 10/02/2022 at 12:50 p.m. FINDINGS: CT HEAD FINDINGS Brain: There is no mass, hemorrhage or extra-axial collection. Multiple areas of supratentorial gliosis and encephalomalacia, affecting the bilateral frontal, parietal and occipital lobes. Multiple old cerebellar infarcts. Skull: The visualized skull base, calvarium and extracranial soft tissues are normal. Sinuses/Orbits: No fluid levels or advanced mucosal thickening of the visualized paranasal sinuses. No mastoid or middle ear effusion. The orbits are normal. CTA NECK FINDINGS SKELETON: There is no bony spinal canal stenosis. No lytic or blastic lesion. OTHER NECK: Normal pharynx, larynx and major salivary glands. No cervical lymphadenopathy. Unremarkable thyroid gland. UPPER CHEST: No pneumothorax or pleural effusion. No nodules or masses. AORTIC ARCH: There is calcific atherosclerosis of the aortic arch. There is no aneurysm, dissection or hemodynamically significant stenosis of the visualized portion of the aorta. Conventional 3 vessel aortic branching pattern. The visualized proximal subclavian arteries are widely patent. RIGHT CAROTID SYSTEM: Normal without  aneurysm, dissection or stenosis. LEFT CAROTID SYSTEM: No dissection, occlusion or aneurysm. Mild atherosclerotic calcification at the carotid bifurcation without hemodynamically significant stenosis. VERTEBRAL ARTERIES: Poor visualization of the proximal vertebral arteries due to streak artifacts from contrast bolus but no visible abnormality. CTA HEAD FINDINGS POSTERIOR CIRCULATION: --Vertebral arteries: Normal V4 segments. --Inferior cerebellar arteries: Normal. --Basilar artery: Normal. --Superior cerebellar arteries: Normal. --Posterior cerebral arteries (PCA): Normal. ANTERIOR CIRCULATION: --Intracranial internal carotid arteries: Normal. --Anterior cerebral arteries (ACA): Normal. Both A1 segments are present. Patent anterior communicating artery (a-comm). --Middle cerebral arteries (MCA): There is a new short segment occlusion of the distal left MCA M2 segment (series 9, images 120 9-135 and series 11, image 32). The right MCA is normal. VENOUS SINUSES: As permitted by contrast timing, patent. ANATOMIC VARIANTS: None Review of the MIP images confirms the above findings. IMPRESSION: Compared to the scan from earlier today, there is a new short segment occlusion of the distal left MCA M2 segment. These results were called by telephone at the time of interpretation on 10/02/2022 at 8:43 pm to provider [facility] , who verbally acknowledged these results. Electronically Signed   By: [name] M.D.   On: 10/02/2022 20:44   CT HEAD WO CONTRAST ( )  Result Date: 10/02/2022 CLINICAL DATA:  Provided history: Stroke, follow-up.  Nausea. EXAM: CT HEAD WITHOUT CONTRAST TECHNIQUE: Contiguous axial images were obtained from the base of the skull through the vertex without intravenous contrast. RADIATION DOSE REDUCTION: This exam was performed according to the departmental dose-optimization program which includes automated exposure control, adjustment of the mA and/or kV according to patient size and/or use  of iterative reconstruction technique. COMPARISON:  Non-contrast head CT and CT angiogram head/neck 10/02/2022. Brain MRI 05/04/2022. FINDINGS: Brain: No age advanced or lobar predominant parenchymal atrophy. Redemonstrated chronic cortically-based infarcts within the bilateral frontal lobes, bilateral parietal lobes and bilateral occipital lobes, as well as chronic infarcts within the bilateral cerebellar hemispheres. Background moderate patchy and ill-defined  hypoattenuation within the cerebral white matter, nonspecific but compatible with chronic small vessel ischemic disease. There is no acute intracranial hemorrhage. No acute demarcated cortical infarct. No extra-axial fluid collection. No evidence of an intracranial mass. No midline shift. Vascular: Residual circulating intravascular contrast from CTA head/neck performed earlier today. Atherosclerotic calcifications. Skull: No fracture or aggressive osseous lesion. Sinuses/Orbits: No mass or acute finding within the imaged orbits. No significant paranasal sinus disease at the imaged levels. IMPRESSION: 1.  No evidence of an acute intracranial abnormality. 2. Chronic small-vessel ischemic disease and chronic infarcts, as described. Electronically Signed   By: Jackey Loge D.O.   On: 10/02/2022 14:32   CT ANGIO HEAD NECK W WO CM (CODE STROKE)  Result Date: 10/02/2022 CLINICAL DATA:  Left-sided weakness and speech changes. EXAM: CT ANGIOGRAPHY HEAD AND NECK WITH AND WITHOUT CONTRAST TECHNIQUE: Multidetector CT imaging of the head and neck was performed using the standard protocol during bolus administration of intravenous contrast. Multiplanar CT image reconstructions and MIPs were obtained to evaluate the vascular anatomy. Carotid stenosis measurements (when applicable) are obtained utilizing NASCET criteria, using the distal internal carotid diameter as the denominator. RADIATION DOSE REDUCTION: This exam was performed according to the departmental  dose-optimization program which includes automated exposure control, adjustment of the mA and/or kV according to patient size and/or use of iterative reconstruction technique. CONTRAST:  75mL OMNIPAQUE IOHEXOL 350 MG/ML SOLN COMPARISON:  Head and neck CTA 05/04/2022 FINDINGS: CTA NECK FINDINGS Aortic arch: Normal variant aortic arch branching pattern with common origin of the brachiocephalic and left common carotid arteries. Mild atherosclerosis without significant stenosis of the arch vessel origins. Right carotid system: Patent without evidence of stenosis or dissection. Left carotid system: Patent without evidence of stenosis or dissection. Small volume calcified and soft plaque in the carotid bulb. Vertebral arteries: Patent without evidence of stenosis or dissection. Dominant right vertebral artery. Skeleton: Mild cervical spondylosis. Other neck: Motion artifact through the pharynx and larynx. No evidence of cervical lymphadenopathy. Upper chest: Respiratory motion without a mass or consolidation in the included lung apices. Review of the MIP images confirms the above findings CTA HEAD FINDINGS Anterior circulation: The internal carotid arteries are patent from skull base to carotid termini with minor atherosclerosis and no significant stenosis. ACAs and MCAs are patent with mild atherosclerotic regularity but no evidence of a proximal branch occlusion or flow limiting proximal stenosis. A fenestration is noted in the distal left A1 and proximal A2 segments. No aneurysm is identified. Posterior circulation: The intracranial vertebral arteries are patent to the basilar with the left being hypoplastic distal to the PICA origin. Patent PICA and SCA origins are visualized bilaterally. The basilar artery is widely patent. There are robust posterior communicating arteries bilaterally, right larger than left, and the right P1 segment is hypoplastic or absent. Both PCAs are patent without evidence of a significant  proximal stenosis. No aneurysm is identified. Venous sinuses: As permitted by contrast timing, patent. Anatomic variants: Fetal right PCA. Review of the MIP images confirms the above findings These results were communicated to Dr. Roda Shutters at 1:02 pm on 10/02/2022 by text page via the [facility] messaging system. IMPRESSION: 1. Mild atherosclerosis in the head and neck without a large vessel occlusion or significant proximal stenosis. 2.  Aortic Atherosclerosis (ICD10-I70.0). Electronically Signed   By: Sebastian Ache M.D.   On: 10/02/2022 13:13   CT HEAD CODE STROKE WO CONTRAST  Result Date: 10/02/2022 CLINICAL DATA:  Code stroke. Left-sided weakness and speech changes.  EXAM: CT HEAD WITHOUT CONTRAST TECHNIQUE: Contiguous axial images were obtained from the base of the skull through the vertex without intravenous contrast. RADIATION DOSE REDUCTION: This exam was performed according to the departmental dose-optimization program which includes automated exposure control, adjustment of the mA and/or kV according to patient size and/or use of iterative reconstruction technique. COMPARISON:  Brain MRI and CT/CTA head and neck 05/04/2022 FINDINGS: Brain: There is no evidence of acute intracranial hemorrhage, extra-axial fluid collection, or acute territorial infarct. Multiple remote infarcts are seen in the bilateral cerebral and cerebellar hemispheres. Parenchymal volume is stable. The ventricles are stable in size. The pituitary and suprasellar region are normal. There is no mass lesion. There is no mass effect or midline shift. Vascular: No hyperdense vessel or unexpected calcification. Skull: Normal. Negative for fracture or focal lesion. Sinuses/Orbits: The imaged paranasal sinuses are clear. Bilateral lens implants are in place. The globes and orbits are otherwise unremarkable. Other: The mastoid air cells and middle ear cavities are clear. ASPECTS [facility] Stroke Program Early CT Score) - Ganglionic level infarction  (caudate, lentiform nuclei, internal capsule, insula, M1-M3 cortex): 7 - Supraganglionic infarction (M4-M6 cortex): Total score (0-10 with 10 being normal): 10 IMPRESSION: No acute intracranial pathology. Findings communicated to Dr Roda Shutters at 12:45 pm. Electronically Signed   By: Lesia Hausen M.D.   On: 10/02/2022 12:47    Labs:  CBC: Recent Labs    05/04/22 0130 05/04/22 0242 10/02/22 1228 10/02/22 1236  WBC 6.0  --  5.5  --   HGB 15.5 15.0 16.7 17.3*  HCT 44.2 44.0 50.0 51.0  PLT 123*  --  148*  --     COAGS: Recent Labs    05/04/22 0130 10/02/22 1228  INR 1.2 1.1  APTT 30 30    BMP: Recent Labs    05/04/22 0130 05/04/22 0242 10/02/22 1228 10/02/22 1236  NA 139 140 139 139  K 3.7 3.7 4.0 3.9  CL 102 103 101 105  CO2 25  --  23  --   GLUCOSE 114* 131* 81 79  BUN 13 13 9 9   CALCIUM 9.5  --  9.4  --   CREATININE 1.13 1.00 1.10 1.10  GFRNONAA >60  --  >60  --     LIVER FUNCTION TESTS: Recent Labs    10/02/22 1228  BILITOT 0.6  AST 19  ALT 16  ALKPHOS 117  PROT 7.0  ALBUMIN 4.0    Assessment and Plan: L MCA/M3 occlusion s/p mechanical thrombectomy with recanalization achieving TICI3.  Small SAH vs. Contrast extrav in the left Sylvian fissue post-procedure In summary, patient with history of multiple prior strokes including frontal, parietal, occipital, and most recently a cerebellar stroke 04/2022 with complex presentation of waxing/waning symptoms.  Initial imaging showed no LVO and patient was given dose of TNK.  After TNK he improved, but again developed symptoms of R sided weakness, aphasia/dysphasia. Repeat imaging showed interval development of MCA occlusion.  Extensive discussions held with patient/family throughout and decision made to proceed with thrombectomy.  Thrombectomy successful with removal of clot, however small SAH vs. Michaela Corner noted on post-procedure imaging.  Patient again with change in symptoms this AM.  Repeat CT Head shows progression of  hemorrhage.   Patient assessed at bedside.  He is alert and follows commands.  Weakness ongoing on the right. Trouble speaking-- word finding difficulty, as well as annunciation.   Procedure sites intact.   IR will follow.   Electronically Signed: Senaida Ores  Ashley Royalty, PA 10/03/2022, 11:02 AM   I spent a total of 15 Minutes at the the patient's bedside AND on the patient's hospital floor or unit, greater than 50% of which was counseling/coordinating care for MCA occlusion.

## 2022-10-03 NOTE — Anesthesia Preprocedure Evaluation (Signed)
Anesthesia Evaluation  Patient identified by MRN, date of birth, ID band Patient awake    Reviewed: Allergy & Precautions, NPO status , Patient's Chart, lab work & pertinent test results  History of Anesthesia Complications Negative for: history of anesthetic complications  Airway Mallampati: I  TM Distance: >3 FB Neck ROM: Full    Dental  (+) Dental Advisory Given, Upper Dentures   Pulmonary Current Smoker   breath sounds clear to auscultation       Cardiovascular hypertension, Pt. on home beta blockers and Pt. on medications + CAD, + Past MI and +CHF   Rhythm:Regular Rate:Normal  TEE 04/2022 IMPRESSIONS     1. Left ventricular ejection fraction, by estimation, is 35 to 40%. The  left ventricle has moderately decreased function. The left ventricle  demonstrates regional wall motion abnormalities (see scoring  diagram/findings for description).  Inferior/inferolateral akinesis. There is mild left ventricular  hypertrophy. Left ventricular diastolic parameters are consistent with  Grade II diastolic dysfunction (pseudonormalization). Elevated left atrial  pressure.   2. Right ventricular systolic function was not well visualized. The right  ventricular size is not well visualized.   3. The mitral valve is normal in structure. No evidence of mitral valve  regurgitation. No evidence of mitral stenosis.   4. The aortic valve is tricuspid. Aortic valve regurgitation is not  visualized. No aortic stenosis is present.   5. Agitated saline contrast bubble study was positive with shunting  observed within 3-6 cardiac cycles suggestive of interatrial shunt. Poor  quality images on bubble study, but significant shunting seen   Cath 08/2020 1. Single vessel occlusive CAD with CTO of the mid RCA.  2. Patent stents in the mid LAD and LCx.  3. No SVG to OM visualized but is felt to be occluded. 4. Low LVEDP 3 mm Hg   Plan: recommend  continued medical management.      Neuro/Psych CVA    GI/Hepatic negative GI ROS, Neg liver ROS,,,  Endo/Other  negative endocrine ROS    Renal/GU negative Renal ROS     Musculoskeletal negative musculoskeletal ROS (+)    Abdominal Normal abdominal exam  (+)   Peds  Hematology negative hematology ROS (+)   Anesthesia Other Findings   Reproductive/Obstetrics                             Anesthesia Physical Anesthesia Plan  ASA: III and emergent  Anesthesia Plan: General   Post-op Pain Management: Minimal or no pain anticipated   Induction: Intravenous  PONV Risk Score and Plan: 2 and Ondansetron and Dexamethasone  Airway Management Planned: Oral ETT  Additional Equipment: None  Intra-op Plan:   Post-operative Plan: Extubation in OR  Informed Consent: I have reviewed the patients History and Physical, chart, labs and discussed the procedure including the risks, benefits and alternatives for the proposed anesthesia with the patient or authorized representative who has indicated his/her understanding and acceptance.     Dental advisory given  Plan Discussed with:   Anesthesia Plan Comments:         Anesthesia Quick Evaluation

## 2022-10-03 NOTE — Progress Notes (Addendum)
STROKE TEAM PROGRESS NOTE   INTERVAL HISTORY His family is not at the bedside.    Pt had recent right cerebellar infarct in 04/2022 with nausea, imbalance and right hand ataxia. CT, CTA head and neck negative, MRI showed right cerebellar infarct. EF 35-40%, LDL 62 abd A1C 6.0. put on ASA and brilinta for a month and then back to ASA and plavix. His CT and MRI showed multiple old infarcts at b/l frontal, parietal, occipital and cerebellum.  (Per EHR)  Patient presented L sided weakness and numbness. He received TNK 1246 (6/18).  But later on in the night he developed a new stroke with aphasia and right-sided weakness.  He is s/p mechanical thrombectomy of L M3 segment of MCA 0230 6/19. He was noted to have small hemorrhage at that time.   CTH 0500 6/19 showed progressed hemorrhage in L frontoparietal region w/ 3cm hematoma and regional SAH.   MRI brain 1155 6/19 showed progressive L frontoparietal hematoma (~7cm). [facility] also likely progressed. Increased local mass effect w/ no midline shift. Small acute R cerebellar infarct, likely underlying infarct in area of bleed. Numerous chronic cerebral and cerebellar infarcts.   Patient with persistent dysarthric speech improved L sided strength.  But significant right upper extremity weakness Blood pressure adequately controlled.  Vitals:   10/03/22 1200 10/03/22 1242 10/03/22 1300 10/03/22 1400  BP: 130/78 130/84 120/84 123/84  Pulse: 84 87 62 82  Resp: (!) 25 (!) 24 (!) 26 (!) 30  Temp: 99.2 F (37.3 C)     TempSrc: Oral     SpO2: 97% 96% (!) 86% 95%  Weight:      Height:       CBC:  Recent Labs  Lab 10/02/22 1228 10/02/22 1236  WBC 5.5  --   NEUTROABS 2.1  --   HGB 16.7 17.3*  HCT 50.0 51.0  MCV 88.0  --   PLT 148*  --    Basic Metabolic Panel:  Recent Labs  Lab 10/02/22 1228 10/02/22 1236  NA 139 139  K 4.0 3.9  CL 101 105  CO2 23  --   GLUCOSE 81 79  BUN 9 9  CREATININE 1.10 1.10  CALCIUM 9.4  --    Lipid  Panel:  Recent Labs  Lab 10/03/22 0058  CHOL 124  TRIG 67  HDL 48  CHOLHDL 2.6  VLDL 13  LDLCALC 63   HgbA1c:  Recent Labs  Lab 10/03/22 0058  HGBA1C 5.8*   Urine Drug Screen:  Recent Labs  Lab 10/02/22 2140  LABOPIA NONE DETECTED  COCAINSCRNUR NONE DETECTED  LABBENZ NONE DETECTED  AMPHETMU NONE DETECTED  THCU NONE DETECTED  LABBARB NONE DETECTED    Alcohol Level  Recent Labs  Lab 10/02/22 1228  ETH <10    IMAGING past 24 hours MR BRAIN WO CONTRAST  Result Date: 10/03/2022 CLINICAL DATA:  Stroke follow-up EXAM: MRI HEAD WITHOUT CONTRAST TECHNIQUE: Multiplanar, multiecho pulse sequences of the brain and surrounding structures were obtained without intravenous contrast. COMPARISON:  Head CT from earlier today FINDINGS: Brain: Collection in the left frontal parietal region with fluid and clot like appearance measuring up to 7 cm with regional subarachnoid material from hemorrhage, notably progressed from CT earlier today. Small acute infarct in the superior right cerebellum. Multiple chronic bilateral cerebellar and posterior cerebral infarcts. Scattered chronic blood products along the chronic infarcts. Increase in local mass effect although no midline shift or herniation. Vascular: Major flow voids are preserved Skull and  upper cervical spine: Normal marrow signal. Sinuses/Orbits: Bilateral cataract resection. At signing of report Dr. Pearlean Brownie is aware of findings as confirmed in epic chat. IMPRESSION: 1. Progressive left frontal parietal hematoma with cavity measuring ~ 7 cm. Regional subarachnoid hemorrhage is likely also progressed. There is increased local mass effect although no midline shift or herniation. 2. Assessing regional diffusion signal is complicated by the blood products, suspect underlying infarct. 3. Small acute right cerebellar infarct. 4. Numerous chronic cerebral and cerebellar infarcts with chronic blood products. Electronically Signed   By: Tiburcio Pea  M.D.   On: 10/03/2022 13:05   ECHOCARDIOGRAM COMPLETE  Result Date: 10/03/2022    ECHOCARDIOGRAM REPORT   Patient Name:   [name] Date of Exam: 10/03/2022 Medical Rec #:  [id number]         Height:       73.0 in Accession #:    [id number]        Weight:       231.5 lb Date of Birth:  05-19-1960          BSA:          2.290 m Patient Age:    62 years          BP:           119/82 mmHg Patient Gender: M                 HR:           67 bpm. Exam Location:  Inpatient Procedure: 2D Echo, Cardiac Doppler and Color Doppler Indications:     Stroke  History:         Patient has prior history of Echocardiogram examinations, most                  recent 05/04/2022. CHF and Cardiomyopathy, CAD, Defibrillator,                  Stroke; Risk Factors:Dyslipidemia and Current Smoker. _                  Pericardium: Tamponade on 01/2013. PFO.  Sonographer:     Milda Smart Referring Phys:  [id number] Diagnosing Phys: Armanda Magic MD  Sonographer Comments: Suboptimal subcostal window and suboptimal apical window. Image acquisition challenging due to patient body habitus and Image acquisition challenging due to respiratory motion. Pt not optimally positioned due to significant right side weakness. Definity not used due to pt has a PFO confirmed by bubble study on 05/04/22. IMPRESSIONS  1. Cannot comment on EF due to poor acoustical windows. Left ventricular endocardial border not optimally defined to evaluate regional wall motion. Images suggestive of focal wall motion abnormalities in the inferior and inferoseptal walls but cannot comment with certainty. Left ventricular diastolic parameters are indeterminate. Elevated left ventricular end-diastolic pressure.  2. Right ventricular systolic function was not well visualized. The right ventricular size is not well visualized. Tricuspid regurgitation signal is inadequate for assessing PA pressure.  3. The mitral valve is normal in structure. Trivial mitral valve  regurgitation. No evidence of mitral stenosis.  4. The aortic valve is normal in structure. Aortic valve regurgitation is not visualized. No aortic stenosis is present.  5. Definity contrast was not used due to presence of known PFO FINDINGS  Left Ventricle: Cannot comment on EF due to poor acoustical windows. Left ventricular endocardial border not optimally defined to evaluate regional wall motion. Images suggestive of focal wall motion abnormalities  in the inferior and inferoseptal walls but cannot comment with certainty. The left ventricular internal cavity size was normal in size. There is no left ventricular hypertrophy. Left ventricular diastolic parameters are indeterminate. Elevated left ventricular end-diastolic pressure. Right Ventricle: The right ventricular size is not well visualized. Right vetricular wall thickness was not assessed. Right ventricular systolic function was not well visualized. Tricuspid regurgitation signal is inadequate for assessing PA pressure. Left Atrium: Left atrial size was normal in size. Right Atrium: Right atrial size was normal in size. Pericardium: There is no evidence of pericardial effusion. Mitral Valve: The mitral valve is normal in structure. Trivial mitral valve regurgitation. No evidence of mitral valve stenosis. Tricuspid Valve: The tricuspid valve is normal in structure. Tricuspid valve regurgitation is not demonstrated. No evidence of tricuspid stenosis. Aortic Valve: The aortic valve is normal in structure. Aortic valve regurgitation is not visualized. No aortic stenosis is present. Pulmonic Valve: The pulmonic valve was normal in structure. Pulmonic valve regurgitation is mild. No evidence of pulmonic stenosis. Aorta: The aortic root is normal in size and structure. Venous: The inferior vena cava was not well visualized. IAS/Shunts: There is redundancy of the interatrial septum. The interatrial septum was not well visualized.  LEFT VENTRICLE PLAX 2D LVIDd:          5.60 cm   Diastology LVIDs:         4.90 cm   LV e' medial:    3.37 cm/s LV PW:         1.00 cm   LV E/e' medial:  21.8 LV IVS:        1.00 cm   LV e' lateral:   7.43 cm/s LVOT diam:     2.60 cm   LV E/e' lateral: 9.9 LV SV:         98 LV SV Index:   43 LVOT Area:     5.31 cm  RIGHT VENTRICLE RV S prime:     6.37 cm/s TAPSE (M-mode): 0.8 cm LEFT ATRIUM             Index        RIGHT ATRIUM           Index LA diam:        4.40 cm 1.92 cm/m   RA Area:     13.20 cm LA Vol (A2C):   56.3 ml 24.59 ml/m  RA Volume:   26.90 ml  11.75 ml/m LA Vol (A4C):   41.4 ml 18.08 ml/m LA Biplane Vol: 47.8 ml 20.88 ml/m  AORTIC VALVE LVOT Vmax:   76.00 cm/s LVOT Vmean:  54.500 cm/s LVOT VTI:    0.185 m  AORTA Ao Root diam: 3.70 cm Ao Asc diam:  3.40 cm MITRAL VALVE MV Area (PHT): 2.90 cm    SHUNTS MV Decel Time: 262 msec    Systemic VTI:  0.18 m MV E velocity: 73.60 cm/s  Systemic Diam: 2.60 cm MV A velocity: 77.50 cm/s MV E/A ratio:  0.95 Armanda Magic MD Electronically signed by Armanda Magic MD Signature Date/Time: 10/03/2022/11:23:43 AM    Final (Updated)    IR PERCUTANEOUS ART THROMBECTOMY/INFUSION INTRACRANIAL INC DIAG ANGIO  Result Date: 10/03/2022 INDICATION: 62 year old male who presented to emergency on 10/02/2022 with left-sided weakness, slurred speech and dizziness; NIHSS 6. Head CT was negative for acute infarct and CTA was negative for large vessel occlusion. Patient received IV TNK. Patient later throughout the day developed stuttering aphasia with NIH fluctuating between 0-4. Given  transient nature of symptoms, family declined intervention. However, around 1 a.m. on 10/03/2022, patient had a more prolonged episode of aphasia, NIHSS 8. At that time, family decided to proceed with intervention. This past medical history significant for CAD, CHF status post ICD, hyperlipidemia and multiple prior strokes. EXAM: ULTRASOUND-GUIDED VASCULAR ACCESS DIAGNOSTIC CEREBRAL ANGIOGRAM MECHANICAL THROMBECTOMY FLAT PANEL HEAD  CT COMPARISON:  CT angiogram of the head and neck October 02, 2022. MEDICATIONS: No antibiotics utilized. ANESTHESIA/SEDATION: The procedure was performed under general anaesthesia. CONTRAST:  40 mL of Omnipaque 300 milligram/mL FLUOROSCOPY: Radiation Exposure Index (as provided by the fluoroscopic device): 618 mGy Kerma COMPLICATIONS: SIR Level A - No therapy, no consequence. TECHNIQUE: Informed written consent was obtained from the patient's son after a thorough discussion of the procedural risks, benefits and alternatives. All questions were addressed. Maximal Sterile Barrier Technique was utilized including caps, mask, sterile gowns, sterile gloves, sterile drape, hand hygiene and skin antiseptic. A timeout was performed prior to the initiation of the procedure. The right groin was prepped and draped in the usual sterile fashion. Using a micropuncture kit and the modified Seldinger technique, access was gained to the right common femoral artery and an 8 French sheath was placed. Real-time ultrasound guidance was utilized for vascular access including the acquisition of a permanent ultrasound image documenting patency of the accessed vessel. Under fluoroscopy, a Zoom 88 guide catheter was navigated over a 6 [place] Berenstein 2 catheter and a 0.035" Terumo Glidewire into the aortic arch. The catheter was placed into the left common carotid artery and then advanced into the left internal carotid artery. The diagnostic catheter was removed. Frontal and lateral angiograms of the head were obtained. FINDINGS: 1. Normal caliber of the right common femoral artery, adequate for vascular access. 2. Occlusion of a left M3/MCA posterior division branch. PROCEDURE: Using biplane roadmap guidance, a Zoom 55 aspiration catheter was navigated over Colossus 35 microguidewire into the cavernous segment of the left ICA. The aspiration catheter was then advanced to the level of occlusion in the M3 segment and connected to an  aspiration pump. Continuous aspiration was performed for 2 minutes. The guide catheter was connected to a VacLok syringe. The aspiration catheter was subsequently removed under constant aspiration. The guide catheter was aspirated for debris. Left internal carotid artery angiograms with frontal and lateral views of the head showed recanalization of the M3 segment with slow flow in the distal M3-M4 segment related to subocclusive filling defect. Using biplane roadmap guidance, a Zoom 55 aspiration catheter was navigated over Colossus 35 microguidewire into the cavernous segment of the left ICA. The aspiration catheter was then advanced to the the M2 segment in the micro guidewire was advanced to level of occlusion. The wire and aspiration catheter was then removed. No thrombectomy pass performed. Left internal carotid artery angiograms with frontal and lateral views of the head showed complete patency of the left MCA vascular tree (TICI3). No evidence of active contrast extravasation. Flat panel CT of the head was obtained and post processed in a separate workstation with concurrent attending physician supervision. Selected images were sent to PACS. Small hyperdensity in the posterior aspect of the left sylvian fissure may represent contrast versus subarachnoid hemorrhage. The catheter was subsequently withdrawn. Right common femoral artery angiogram was obtained in right anterior oblique view. The puncture is at the level of the common femoral artery. The artery has normal caliber, adequate for closure device. The sheath was exchanged over the wire for a Perclose prostyle which was  utilized for access closure. Immediate hemostasis was achieved. IMPRESSION: 1. Successful mechanical thrombectomy performed for treatment of a left M3/MCA occlusion achieving complete recanalization (TICI 3). 2. Hyperdensity within the left sylvian fissure may represent small subarachnoid hemorrhage versus contrast. PLAN: 1. Transferred  to ICU for observation. 2. Follow-up CT within 3 hours. Electronically Signed   By: Baldemar Lenis M.D.   On: 10/03/2022 09:31   IR US Guide Vasc Access Right  Result Date: 10/03/2022 INDICATION: 62 year old male who presented to emergency on 10/02/2022 with left-sided weakness, slurred speech and dizziness; NIHSS 6. Head CT was negative for acute infarct and CTA was negative for large vessel occlusion. Patient received IV TNK. Patient later throughout the day developed stuttering aphasia with NIH fluctuating between 0-4. Given transient nature of symptoms, family declined intervention. However, around 1 a.m. on 10/03/2022, patient had a more prolonged episode of aphasia, NIHSS 8. At that time, family decided to proceed with intervention. This past medical history significant for CAD, CHF status post ICD, hyperlipidemia and multiple prior strokes. EXAM: ULTRASOUND-GUIDED VASCULAR ACCESS DIAGNOSTIC CEREBRAL ANGIOGRAM MECHANICAL THROMBECTOMY FLAT PANEL HEAD CT COMPARISON:  CT angiogram of the head and neck October 02, 2022. MEDICATIONS: No antibiotics utilized. ANESTHESIA/SEDATION: The procedure was performed under general anaesthesia. CONTRAST:  40 mL of Omnipaque 300 milligram/mL FLUOROSCOPY: Radiation Exposure Index (as provided by the fluoroscopic device): 618 mGy Kerma COMPLICATIONS: SIR Level A - No therapy, no consequence. TECHNIQUE: Informed written consent was obtained from the patient's son after a thorough discussion of the procedural risks, benefits and alternatives. All questions were addressed. Maximal Sterile Barrier Technique was utilized including caps, mask, sterile gowns, sterile gloves, sterile drape, hand hygiene and skin antiseptic. A timeout was performed prior to the initiation of the procedure. The right groin was prepped and draped in the usual sterile fashion. Using a micropuncture kit and the modified Seldinger technique, access was gained to the right common femoral artery  and an 8 French sheath was placed. Real-time ultrasound guidance was utilized for vascular access including the acquisition of a permanent ultrasound image documenting patency of the accessed vessel. Under fluoroscopy, a Zoom 88 guide catheter was navigated over a 6 [place] Berenstein 2 catheter and a 0.035" Terumo Glidewire into the aortic arch. The catheter was placed into the left common carotid artery and then advanced into the left internal carotid artery. The diagnostic catheter was removed. Frontal and lateral angiograms of the head were obtained. FINDINGS: 1. Normal caliber of the right common femoral artery, adequate for vascular access. 2. Occlusion of a left M3/MCA posterior division branch. PROCEDURE: Using biplane roadmap guidance, a Zoom 55 aspiration catheter was navigated over Colossus 35 microguidewire into the cavernous segment of the left ICA. The aspiration catheter was then advanced to the level of occlusion in the M3 segment and connected to an aspiration pump. Continuous aspiration was performed for 2 minutes. The guide catheter was connected to a VacLok syringe. The aspiration catheter was subsequently removed under constant aspiration. The guide catheter was aspirated for debris. Left internal carotid artery angiograms with frontal and lateral views of the head showed recanalization of the M3 segment with slow flow in the distal M3-M4 segment related to subocclusive filling defect. Using biplane roadmap guidance, a Zoom 55 aspiration catheter was navigated over Colossus 35 microguidewire into the cavernous segment of the left ICA. The aspiration catheter was then advanced to the the M2 segment in the micro guidewire was advanced to level of occlusion. The wire  and aspiration catheter was then removed. No thrombectomy pass performed. Left internal carotid artery angiograms with frontal and lateral views of the head showed complete patency of the left MCA vascular tree (TICI3). No evidence of  active contrast extravasation. Flat panel CT of the head was obtained and post processed in a separate workstation with concurrent attending physician supervision. Selected images were sent to PACS. Small hyperdensity in the posterior aspect of the left sylvian fissure may represent contrast versus subarachnoid hemorrhage. The catheter was subsequently withdrawn. Right common femoral artery angiogram was obtained in right anterior oblique view. The puncture is at the level of the common femoral artery. The artery has normal caliber, adequate for closure device. The sheath was exchanged over the wire for a Perclose prostyle which was utilized for access closure. Immediate hemostasis was achieved. IMPRESSION: 1. Successful mechanical thrombectomy performed for treatment of a left M3/MCA occlusion achieving complete recanalization (TICI 3). 2. Hyperdensity within the left sylvian fissure may represent small subarachnoid hemorrhage versus contrast. PLAN: 1. Transferred to ICU for observation. 2. Follow-up CT within 3 hours. Electronically Signed   By: Baldemar Lenis M.D.   On: 10/03/2022 09:31   IR CT Head Ltd  Result Date: 10/03/2022 INDICATION: 62 year old male who presented to emergency on 10/02/2022 with left-sided weakness, slurred speech and dizziness; NIHSS 6. Head CT was negative for acute infarct and CTA was negative for large vessel occlusion. Patient received IV TNK. Patient later throughout the day developed stuttering aphasia with NIH fluctuating between 0-4. Given transient nature of symptoms, family declined intervention. However, around 1 a.m. on 10/03/2022, patient had a more prolonged episode of aphasia, NIHSS 8. At that time, family decided to proceed with intervention. This past medical history significant for CAD, CHF status post ICD, hyperlipidemia and multiple prior strokes. EXAM: ULTRASOUND-GUIDED VASCULAR ACCESS DIAGNOSTIC CEREBRAL ANGIOGRAM MECHANICAL THROMBECTOMY FLAT PANEL  HEAD CT COMPARISON:  CT angiogram of the head and neck October 02, 2022. MEDICATIONS: No antibiotics utilized. ANESTHESIA/SEDATION: The procedure was performed under general anaesthesia. CONTRAST:  40 mL of Omnipaque 300 milligram/mL FLUOROSCOPY: Radiation Exposure Index (as provided by the fluoroscopic device): 618 mGy Kerma COMPLICATIONS: SIR Level A - No therapy, no consequence. TECHNIQUE: Informed written consent was obtained from the patient's son after a thorough discussion of the procedural risks, benefits and alternatives. All questions were addressed. Maximal Sterile Barrier Technique was utilized including caps, mask, sterile gowns, sterile gloves, sterile drape, hand hygiene and skin antiseptic. A timeout was performed prior to the initiation of the procedure. The right groin was prepped and draped in the usual sterile fashion. Using a micropuncture kit and the modified Seldinger technique, access was gained to the right common femoral artery and an 8 French sheath was placed. Real-time ultrasound guidance was utilized for vascular access including the acquisition of a permanent ultrasound image documenting patency of the accessed vessel. Under fluoroscopy, a Zoom 88 guide catheter was navigated over a 6 [place] Berenstein 2 catheter and a 0.035" Terumo Glidewire into the aortic arch. The catheter was placed into the left common carotid artery and then advanced into the left internal carotid artery. The diagnostic catheter was removed. Frontal and lateral angiograms of the head were obtained. FINDINGS: 1. Normal caliber of the right common femoral artery, adequate for vascular access. 2. Occlusion of a left M3/MCA posterior division branch. PROCEDURE: Using biplane roadmap guidance, a Zoom 55 aspiration catheter was navigated over Colossus 35 microguidewire into the cavernous segment of the left ICA. The  aspiration catheter was then advanced to the level of occlusion in the M3 segment and connected to an  aspiration pump. Continuous aspiration was performed for 2 minutes. The guide catheter was connected to a VacLok syringe. The aspiration catheter was subsequently removed under constant aspiration. The guide catheter was aspirated for debris. Left internal carotid artery angiograms with frontal and lateral views of the head showed recanalization of the M3 segment with slow flow in the distal M3-M4 segment related to subocclusive filling defect. Using biplane roadmap guidance, a Zoom 55 aspiration catheter was navigated over Colossus 35 microguidewire into the cavernous segment of the left ICA. The aspiration catheter was then advanced to the the M2 segment in the micro guidewire was advanced to level of occlusion. The wire and aspiration catheter was then removed. No thrombectomy pass performed. Left internal carotid artery angiograms with frontal and lateral views of the head showed complete patency of the left MCA vascular tree (TICI3). No evidence of active contrast extravasation. Flat panel CT of the head was obtained and post processed in a separate workstation with concurrent attending physician supervision. Selected images were sent to PACS. Small hyperdensity in the posterior aspect of the left sylvian fissure may represent contrast versus subarachnoid hemorrhage. The catheter was subsequently withdrawn. Right common femoral artery angiogram was obtained in right anterior oblique view. The puncture is at the level of the common femoral artery. The artery has normal caliber, adequate for closure device. The sheath was exchanged over the wire for a Perclose prostyle which was utilized for access closure. Immediate hemostasis was achieved. IMPRESSION: 1. Successful mechanical thrombectomy performed for treatment of a left M3/MCA occlusion achieving complete recanalization (TICI 3). 2. Hyperdensity within the left sylvian fissure may represent small subarachnoid hemorrhage versus contrast. PLAN: 1. Transferred  to ICU for observation. 2. Follow-up CT within 3 hours. Electronically Signed   By: Baldemar Lenis M.D.   On: 10/03/2022 09:31   CT HEAD WO CONTRAST  Result Date: 10/03/2022 CLINICAL DATA:  Stroke follow-up, evaluate for bleed progression. EXAM: CT HEAD WITHOUT CONTRAST TECHNIQUE: Contiguous axial images were obtained from the base of the skull through the vertex without intravenous contrast. RADIATION DOSE REDUCTION: This exam was performed according to the departmental dose-optimization program which includes automated exposure control, adjustment of the mA and/or kV according to patient size and/or use of iterative reconstruction technique. COMPARISON:  Intraoperative head CT. FINDINGS: Brain: Progression of hemorrhage at the high left frontal parietal junction with rounded hematoma presumably in in the parenchyma, measuring up to 3 cm in diameter. Regional subarachnoid hemorrhage is also increased at the level of the sylvian fissure and postcentral gyrus. On sagittal reformats the underlying parenchyma of the parietal and posterior frontal cortex appears edematous, presumably from cytotoxic edema. Numerous chronic cortical and cerebellar infarcts centered in the posterior brain. Vascular: No hyperdense vessel or unexpected calcification. Skull: Negative Sinuses/Orbits: Negative Dr. Derry Lory is already aware as confirmed by telephone 6:10 am on 10/03/2022 IMPRESSION: Progressed hemorrhage in the left frontal parietal region with 3 cm hematoma and regional subarachnoid hemorrhage. Probable underlying cortex infarct. Numerous chronic infarcts. Electronically Signed   By: Tiburcio Pea M.D.   On: 10/03/2022 06:15   CT HEAD WO CONTRAST ( )  Result Date: 10/02/2022 CLINICAL DATA:  Aphasia EXAM: CT HEAD WITHOUT CONTRAST CT ANGIOGRAPHY OF THE HEAD AND NECK TECHNIQUE: Contiguous axial images were obtained from the base of the skull through the vertex without intravenous contrast. Multidetector  CT imaging of the  head and neck was performed using the standard protocol during bolus administration of intravenous contrast. Multiplanar CT image reconstructions and MIPs were obtained to evaluate the vascular anatomy. Carotid stenosis measurements (when applicable) are obtained utilizing NASCET criteria, using the distal internal carotid diameter as the denominator. RADIATION DOSE REDUCTION: This exam was performed according to the departmental dose-optimization program which includes automated exposure control, adjustment of the mA and/or kV according to patient size and/or use of iterative reconstruction technique. COMPARISON:  CTA head and neck 10/02/2022 at 12:50 p.m. FINDINGS: CT HEAD FINDINGS Brain: There is no mass, hemorrhage or extra-axial collection. Multiple areas of supratentorial gliosis and encephalomalacia, affecting the bilateral frontal, parietal and occipital lobes. Multiple old cerebellar infarcts. Skull: The visualized skull base, calvarium and extracranial soft tissues are normal. Sinuses/Orbits: No fluid levels or advanced mucosal thickening of the visualized paranasal sinuses. No mastoid or middle ear effusion. The orbits are normal. CTA NECK FINDINGS SKELETON: There is no bony spinal canal stenosis. No lytic or blastic lesion. OTHER NECK: Normal pharynx, larynx and major salivary glands. No cervical lymphadenopathy. Unremarkable thyroid gland. UPPER CHEST: No pneumothorax or pleural effusion. No nodules or masses. AORTIC ARCH: There is calcific atherosclerosis of the aortic arch. There is no aneurysm, dissection or hemodynamically significant stenosis of the visualized portion of the aorta. Conventional 3 vessel aortic branching pattern. The visualized proximal subclavian arteries are widely patent. RIGHT CAROTID SYSTEM: Normal without aneurysm, dissection or stenosis. LEFT CAROTID SYSTEM: No dissection, occlusion or aneurysm. Mild atherosclerotic calcification at the carotid bifurcation  without hemodynamically significant stenosis. VERTEBRAL ARTERIES: Poor visualization of the proximal vertebral arteries due to streak artifacts from contrast bolus but no visible abnormality. CTA HEAD FINDINGS POSTERIOR CIRCULATION: --Vertebral arteries: Normal V4 segments. --Inferior cerebellar arteries: Normal. --Basilar artery: Normal. --Superior cerebellar arteries: Normal. --Posterior cerebral arteries (PCA): Normal. ANTERIOR CIRCULATION: --Intracranial internal carotid arteries: Normal. --Anterior cerebral arteries (ACA): Normal. Both A1 segments are present. Patent anterior communicating artery (a-comm). --Middle cerebral arteries (MCA): There is a new short segment occlusion of the distal left MCA M2 segment (series 9, images 120 9-135 and series 11, image 32). The right MCA is normal. VENOUS SINUSES: As permitted by contrast timing, patent. ANATOMIC VARIANTS: None Review of the MIP images confirms the above findings. IMPRESSION: Compared to the scan from earlier today, there is a new short segment occlusion of the distal left MCA M2 segment. These results were called by telephone at the time of interpretation on 10/02/2022 at 8:43 pm to provider [facility] , who verbally acknowledged these results. Electronically Signed   By: [name] M.D.   On: 10/02/2022 20:44   CT ANGIO HEAD NECK W WO CM  Result Date: 10/02/2022 CLINICAL DATA:  Aphasia EXAM: CT HEAD WITHOUT CONTRAST CT ANGIOGRAPHY OF THE HEAD AND NECK TECHNIQUE: Contiguous axial images were obtained from the base of the skull through the vertex without intravenous contrast. Multidetector CT imaging of the head and neck was performed using the standard protocol during bolus administration of intravenous contrast. Multiplanar CT image reconstructions and MIPs were obtained to evaluate the vascular anatomy. Carotid stenosis measurements (when applicable) are obtained utilizing NASCET criteria, using the distal internal carotid diameter as the  denominator. RADIATION DOSE REDUCTION: This exam was performed according to the departmental dose-optimization program which includes automated exposure control, adjustment of the mA and/or kV according to patient size and/or use of iterative reconstruction technique. COMPARISON:  CTA head and neck 10/02/2022 at 12:50 p.m. FINDINGS: CT HEAD FINDINGS  Brain: There is no mass, hemorrhage or extra-axial collection. Multiple areas of supratentorial gliosis and encephalomalacia, affecting the bilateral frontal, parietal and occipital lobes. Multiple old cerebellar infarcts. Skull: The visualized skull base, calvarium and extracranial soft tissues are normal. Sinuses/Orbits: No fluid levels or advanced mucosal thickening of the visualized paranasal sinuses. No mastoid or middle ear effusion. The orbits are normal. CTA NECK FINDINGS SKELETON: There is no bony spinal canal stenosis. No lytic or blastic lesion. OTHER NECK: Normal pharynx, larynx and major salivary glands. No cervical lymphadenopathy. Unremarkable thyroid gland. UPPER CHEST: No pneumothorax or pleural effusion. No nodules or masses. AORTIC ARCH: There is calcific atherosclerosis of the aortic arch. There is no aneurysm, dissection or hemodynamically significant stenosis of the visualized portion of the aorta. Conventional 3 vessel aortic branching pattern. The visualized proximal subclavian arteries are widely patent. RIGHT CAROTID SYSTEM: Normal without aneurysm, dissection or stenosis. LEFT CAROTID SYSTEM: No dissection, occlusion or aneurysm. Mild atherosclerotic calcification at the carotid bifurcation without hemodynamically significant stenosis. VERTEBRAL ARTERIES: Poor visualization of the proximal vertebral arteries due to streak artifacts from contrast bolus but no visible abnormality. CTA HEAD FINDINGS POSTERIOR CIRCULATION: --Vertebral arteries: Normal V4 segments. --Inferior cerebellar arteries: Normal. --Basilar artery: Normal. --Superior  cerebellar arteries: Normal. --Posterior cerebral arteries (PCA): Normal. ANTERIOR CIRCULATION: --Intracranial internal carotid arteries: Normal. --Anterior cerebral arteries (ACA): Normal. Both A1 segments are present. Patent anterior communicating artery (a-comm). --Middle cerebral arteries (MCA): There is a new short segment occlusion of the distal left MCA M2 segment (series 9, images 120 9-135 and series 11, image 32). The right MCA is normal. VENOUS SINUSES: As permitted by contrast timing, patent. ANATOMIC VARIANTS: None Review of the MIP images confirms the above findings. IMPRESSION: Compared to the scan from earlier today, there is a new short segment occlusion of the distal left MCA M2 segment. These results were called by telephone at the time of interpretation on 10/02/2022 at 8:43 pm to provider [facility] , who verbally acknowledged these results. Electronically Signed   By: [name] M.D.   On: 10/02/2022 20:44    PHYSICAL EXAM Physical Exam  Constitutional: Appears well-developed and well-nourished.  Psych: Affect appropriate to situation Eyes: No scleral injection HENT: No OP obstrucion Head: Normocephalic.  Cardiovascular: Normal rate and regular rhythm.  Respiratory: Effort normal and breath sounds normal to anterior ascultation GI: Soft.  No distension. There is no tenderness.  Skin: WDI  Neuro: Mental Status: Patient is awake, alert, oriented to person, due to dysarthria unable to clearly make out what else he is saying  dysarthric speech can be understood somewhat with difficulty. Cranial Nerves: II: Visual Fields are full. Pupils are equal, round, and reactive to light.   III,IV, VI: EOMI without ptosis or diplopia.  V: Facial sensation is symmetric to light touch VII: Mild facial droop  VIII: Hearing is intact to voice X: Palate is midline and palate elevates symmetrically, phonation intact XI: Shoulder shrug is symmetric. XII: tongue is midline without  atrophy or fasciculations.  Motor: Tone is normal. Bulk is normal.  Right upper extremity 3/5 proximally and significant right grip weakness.  Right lower extremity 3/5 proximally and distally.  Trace weakness on L sided.   Sensory: Sensation is symmetric to light touch intact Cerebellar: FNF intact bilaterally   ASSESSMENT/PLAN [name] is a 62 y.o. male with history of CAD s/p PCI w/ stent to LCS and LAD, s/p 1v CABG, multiple CVAs (R cerebellar 04/2022),  syscolic HF s/p  ICD, HLD, HTN presenting with slurred speech and L sided weakness now s/p TNK and IR thrombectomy of L MCA (M3 segment) c/b  hemorrhage.   Bilateral strokes s/p TNK initially for left hemiparesis and then several hours later developed new left hemispheric stroke with aphasia and right hemiparesis due to left M3 occlusion treated with mechanical thrombectomy complicated by L frontoparietal parenchymal and subarachnoid hemorrhage w/o midline shift or herniation  Numerous chronic cerebral and cerebellar infarcts TIA Etiology:  Atherosclerosis versus cardioembolic    Post IR CT 1. Successful mechanical thrombectomy performed for treatment of a left M3/MCA occlusion achieving complete recanalization (TICI 3). Hyperdensity within the left sylvian fissure may represent small subarachnoid hemorrhage versus contrast. CT Head: Progressed hemorrhage in L frontoparietal region w/ 3cm hematoma and regional [facility] MRI  progressive L fronto parietal hematoma (~7cm), regional SAH, increased mass effect, no midline shift or herniation.  Likely underlying infarct. Sm R cerebellar infarct, acute. Numerous chornic cerebral and cerebellar infarcts CTA  short segment occlusion of distal L MCA M2 segment  2D Echo poor windows, unable to comment on EF. Focal RWMA. Known PFO Previously on 30 d monitor finished 05/2022-06/2022 no afib noted. No afib with ICD interrogation Will need ICD interrogated this hospitalization  Likely will need  longer term monitor at discharge  CT head at 1200 6/20 Frequent neuro checks  LDL 63 HgbA1c 5.8 VTE prophylaxis - Hold due to ICH    Diet   Diet NPO time specified   aspirin 81 mg daily and clopidogrel 75 mg daily prior to admission, now on No antithrombotic due to hemorrhage.  Therapy recommendations:  Pending Disposition:  ICU  Hypertension Home meds:  coreg 12.5mg  BID, entresto 97-103mg  BID, spironolactone 25mg  every day,  Stable, hold antihypertensives Goal SBP 100-140, bolus PRN, hold home antihypertensives  Long-term BP goal normotensive  Hyperlipidemia Home meds:  atorvastatin, resumed in hospital LDL 63, goal < 70 Continue statin at discharge  Diabetes type II Controlled Home meds:  Farxiga  HgbA1c 5.8, goal < 7.0 CBGs Recent Labs    10/02/22 1227 10/02/22 1354  GLUCAP 89 122*    SSI   Other Active Problems Systolic HF, s/p ICD likely 2/2 ICM. Last EF 35 to 40% Hold GDMT due to lower blood pressures  Hospital day # 1  Marolyn Haller, MD PGY-3 Internal Medicine Resident  Pager [phone]  STROKE MD NOTE :  I have personally obtained history,examined this patient, reviewed notes, independently viewed imaging studies, participated in medical decision making and plan of care.ROS completed by me personally and pertinent positives fully documented  I have made any additions or clarifications directly to the above note. Agree with note above.  Patient initially presented with left hemiparesis and was treated with IV TNK yesterday afternoon and did well but 8 hours later developed new left hemispheric stroke with aphasia with CT angio showing distal left M2 occlusion and after careful discussion of risk-benefit and alternatives with the patient and family underwent emergent thrombectomy with establishment of flow but procedure was complicated with subarachnoid hemorrhage and current normal left frontoparietal hematoma.  Follow-up imaging shows increased size of  hematoma and patient has shown some neurological decline today with increased dysarthria and right upper extremity weakness.  Continue strict blood pressure control with systolic goal 130-150 and close neurological monitoring.  MRI shows only mild cytotoxic edema and no significant midline shift hence we will hold off on hypertonic saline for now but if there is progressive neurologic decline start  hypertonic saline.  Repeat CT scan of the head tomorrow morning.  Long discussion with the patient and answered questions.  Discussed with Dr. Sherlon Handing neurointerventional radiology. This patient is critically ill and at significant risk of neurological worsening, death and care requires constant monitoring of vital signs, hemodynamics,respiratory and cardiac monitoring, extensive review of multiple databases, frequent neurological assessment, discussion with family, other specialists and medical decision making of high complexity.I have made any additions or clarifications directly to the above note.This critical care time does not reflect procedure time, or teaching time or supervisory time of PA/NP/Med Resident etc but could involve care discussion time.  I spent 30 minutes of neurocritical care time  in the care of  this patient.     Delia Heady, MD Medical Director [facility] Pager: [phone] 10/03/2022 5:21 PM   To contact Stroke Continuity provider, please refer to [email or web address]. After hours, contact General Neurology

## 2022-10-03 NOTE — Transfer of Care (Signed)
Immediate Anesthesia Transfer of Care Note  Patient: [name]  Procedure(s) Performed: IR WITH ANESTHESIA  Patient Location: PACU  Anesthesia Type:General  Level of Consciousness: drowsy and patient cooperative  Airway & Oxygen Therapy: Patient Spontanous Breathing and Patient connected to nasal cannula oxygen  Post-op Assessment: Report given to RN, Post -op Vital signs reviewed and stable, and Patient moving all extremities X 4  Post vital signs: Reviewed and stable  Last Vitals:  Vitals Value Taken Time  BP    Temp    Pulse 70   Resp 14   SpO2 95     Last Pain:  Vitals:   10/02/22 2319  TempSrc: Oral  PainSc:          Complications: No notable events documented.

## 2022-10-03 NOTE — Progress Notes (Signed)
At 0530 while this RN was talking to patient and preparing to take for f/u CT, patient began speaking with word-salad type speech, expressive aphasia.   F/u CT obtained and Neuro/Stroke MD notified about change in patient neuro status.   Patient is able to answer yes and no questions and answers orientation questions appropriately in this manner.

## 2022-10-03 NOTE — Progress Notes (Signed)
PT Cancellation Note  Patient Details Name: [name] MRN: [id number] DOB: Jun 19, 1960   Cancelled Treatment:    Reason Eval/Treat Not Completed: (P) Active bedrest order;Patient not medically ready. Pt is > 24 hours s/p TNK and >6 hours s/p IR but has active bedrest orders. Per chart, pt has progression of SAH now. RN requesting hold on PT today. Will plan to follow-up another day as able.   Raymond Gurney, PT, DPT Acute Rehabilitation Services  Office: [phone]    Jewel Baize 10/03/2022, 2:03 PM

## 2022-10-03 NOTE — Code Documentation (Signed)
Responded to Code IR called at 0120 for intermittent aphasia. CTA completed earlier in night showed short segment occlusion of the distal L MCA M2 segment. Please see Dr. Tollie Eth note for more details. Pt prepped for IR and transferred to IR suite at 0127.

## 2022-10-03 NOTE — Progress Notes (Signed)
Brief Nutrition Note  Consult received for enteral/tube feeding initiation and management.  Adult Enteral Nutrition Protocol initiated. Full assessment to follow.  Cortrak tube in place with tip located in distal stomach per xray imaging.   Admitting Dx: Stroke (cerebrum) (HCC) [I63.9]  Labs: Recent Labs  Lab 10/02/22 1228 10/02/22 1236  NA 139 139  K 4.0 3.9  CL 101 105  CO2 23  --   BUN 9 9  CREATININE 1.10 1.10  CALCIUM 9.4  --   GLUCOSE 81 79     [name] RD, LDN Clinical Dietitian See AMiON for contact information.

## 2022-10-03 NOTE — Progress Notes (Signed)
Dayshift RN notified me of worsening neuro exam and now not moving his R side.  STAT CT Head ordered.  Erick Blinks [facility]

## 2022-10-03 NOTE — Progress Notes (Addendum)
Late entry note:  Was notified by RN about change in Neuro exam. Patient now with expressive aphasia with word salad. On my arrival, fluent with word salad, making paraphrasic errors, with clear word finding difficulty. Symptoms resolved however, within a minute or 2 of my arrival. We still went ahead and got CT head which was negative for ICH. CT Angio head and neck obtained with poor contrast bolus and thus delayed images obtained. CTA concerning for a small segment left MCA distal M2 occlusion.  I spoke with patient, got his son over the phone and his caretaker who is his exwife in the room(at patient's request) and discussed details, alternatives, risks and benefits of thrombectomy. I discussed the possibility that the thrombus could spontaneously breakdown or on the otherhand potentially cause complete occlusion and worsen or cause persistent aphasia. Patient's symptoms had resolved by then and given risk of hemorrhage and potential death with thrombectomy, patient and son and patient's exwife made a joint decision to only proceed with thrombectomy if he is having persistent word finding difficulty and to delay it if possible.  At this time, will plan to closely monitor him in the ICU with Q30 Neuro checks. He did have 2 additional episode of aphasia that only lasted few minutes and would still like to hold off on thrombectomy.  We will keep head of bed flat, do IV fluids at 190ml/hr and keep him on strict bed rest. Dr. Nyra Jabs is aware, if patient was to worsen and have persistent aphasia for more than 20-30 mins or if the episodes are becoming more frequent, would revisit this discussion with patient, son and exwife and activated IR.  [name] [facility]  This patient is critically ill and at significant risk of neurological worsening, death and care requires constant monitoring of vital signs, hemodynamics,respiratory and cardiac monitoring, neurological  assessment, discussion with family, other specialists and medical decision making of high complexity. I spent 60 minutes of neurocritical care time  in the care of  this patient. This was time spent independent of any time provided by nurse practitioner or PA.  [name] [facility] 10/03/2022  1:03 AM  Update: 2:07 AM  Had 2 more episode of aphasia. First one closer to midnight lasted about 10-15 mins. Had another one around 0100 with sounds but no full words. Resolved in 10-15 mins but we discussed the concern for frequent and much more prolonged episodes with patient and son. They would like to proceed with thrombectomy.  Dr. Nyra Jabs discussed risks, benefits, details of thrombectomy over phone with patient's son [name] who consented to thrombectomy. I witnessed the entirety of conversation. Code IR activated with new LKW of 0100.

## 2022-10-03 NOTE — Progress Notes (Signed)
  Echocardiogram 2D Echocardiogram has been performed.  [name] 10/03/2022, 10:56 AM

## 2022-10-03 NOTE — Evaluation (Signed)
Speech Language Pathology Evaluation Patient Details Name: [name] MRN: [id number] DOB: May 19, 1960 Today's Date: 10/03/2022 Time: 1340-1400 SLP Time Calculation (min) (ACUTE ONLY): 20 min  Problem List:  Patient Active Problem List   Diagnosis Date Noted   Stroke (cerebrum) (HCC) 10/02/2022   Cerebellar stroke (HCC) 08/06/2022   Cryptogenic stroke (HCC) 08/06/2022   At risk for obstructive sleep apnea 08/06/2022   Tobacco use disorder 08/06/2022   Abnormality of gait due to impairment of balance 08/06/2022   TIA (transient ischemic attack) 05/04/2022   Chronic systolic CHF (congestive heart failure) (HCC) 05/04/2022   Expressive dysphasia 05/04/2022   Unsteady gait 05/04/2022   Near syncope 05/04/2022   Chronic systolic heart failure (HCC) 12/01/2021   ICD (implantable cardioverter-defibrillator) in place 12/01/2021   Rectal bleed 03/06/2021   Unstable angina (HCC)    Hx of CABG 07/09/2019   Lumbar radiculopathy 12/04/2018   Glaucoma 12/04/2018   Cortical age-related cataract of both eyes 12/04/2018   Pain management contract agreement 12/04/2018   Lumbar herniated disc 06/04/2018   Prediabetes 01/01/2018   Hyperlipidemia 12/30/2017   Obesity (BMI 30.0-34.9) 12/30/2017   Benign neoplasm of descending colon    Essential hypertension 11/03/2015   Cardiomyopathy, ischemic 06/03/2015   Chronic systolic dysfunction of left ventricle 06/03/2015   Erectile dysfunction due to arterial insufficiency 12/23/2014   Lipoma of skin and subcutaneous tissue of neck 05/24/2014   History of cardioembolic stroke 11/02/2013   Vision loss of right eye 06/23/2013   Dyslipidemia 06/22/2013   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  03/2013: readm with hypoxia, tachycardia, elevated ESR/CRP, elevated troponin with Dressler syndrome and myopericarditis; treated with steroids.   Acute respiratory failure (HCC)    a. 01/2013: VDRF. b. 03/2013: hypoxia requiring supp O2 during adm, resolved by discharge.   C. difficile colitis    a. 01/2013 during prolonged adm. b. Recurred 03/2013.   Cardiac tamponade    a. 01/2013 s/p drain.   Cataracts, bilateral    Coronary artery disease    a. s/p MI in 2009 in [place] with stenting of the LCX and LAD;  b. 12/2012 NSTEMI/CAD: LM nl, LAD patent prox stent, LCX 50-70 isr (FFR 0.84), RCA dom, 161m, EF 40-45%-->Med Rx. c. 01/2013: anterolateral STEMI complicated by pericardial effusion (presumed purulent pericarditis) and tamponade s/p drain, ruptured LV pseudoaneurysm s/p CorMatrix patch, CABGx1 (SVG-OM1), fever, CVA, VDRF, C diff.   CVA (cerebral infarction)    a. 01/2013 in setting of prolonged hospitalization - residual L arm weakness.   Dressler syndrome (HCC)    a. 03/2013: readm with hypoxia, tachycardia, elevated ESR/CRP, elevated troponin with Dressler syndrome and myopericarditis; treated with steroids.   Glaucoma    Hyperlipidemia    Hypokalemia    Hyponatremia    a. During late 2014.   Ischemic cardiomyopathy    a. Sept/Oct 2014: EF ~40% (ICM). b. 03/2013: EF 25-30%. Off ACEI due to hypotension (MIXED NICM/ICM).   Optic neuropathy, ischemic    Pericardial effusion    a. 01/2013: pericardial effusion (presumed purulent pericarditis) and cardiac tamponade s/p drain. b. Persistent moderate pericardial effusion 03/2013.   Pre-diabetes    Pseudoaneurysm of  left ventricle of heart    a. 01/2013:  Ruptured inferoposterior LV pseudoaneurysm s/p CorMatrix patch.   Sinus bradycardia    asymptomatic   Stroke [facility]) 2009   weak lt side-lt arm   Tobacco abuse    Wears dentures    top   Past Surgical History:  Past Surgical History:  Procedure Laterality Date   CARDIAC CATHETERIZATION   01/06/2013   CATARACT EXTRACTION Bilateral 05/2021   COLONOSCOPY WITH PROPOFOL N/A 07/03/2016   Procedure: COLONOSCOPY WITH PROPOFOL;  Surgeon: Ruffin Frederick, MD;  Location: [facility];  Service: Gastroenterology;  Laterality: N/A;   CORONARY ANGIOPLASTY WITH STENT PLACEMENT  2000's X 2   "1 + 1" (01/06/2013)   CORONARY ARTERY BYPASS GRAFT N/A 01/28/2013   Procedure: CORONARY ARTERY BYPASS GRAFTING (CABG);  Surgeon: Loreli Slot, MD;  Location: [facility] OR;  Service: Open Heart Surgery;  Laterality: N/A;  CABG x one, using left greater saphenous vein harvested endoscopically   ICD IMPLANT N/A 05/25/2020   Procedure: ICD IMPLANT;  Surgeon: Hillis Range, MD;  Location: [facility] INVASIVE CV LAB;  Service: Cardiovascular;  Laterality: N/A;   INTRAOPERATIVE TRANSESOPHAGEAL ECHOCARDIOGRAM N/A 01/28/2013   Procedure: INTRAOPERATIVE TRANSESOPHAGEAL ECHOCARDIOGRAM;  Surgeon: Loreli Slot, MD;  Location: [facility] OR;  Service: Open Heart Surgery;  Laterality: N/A;   IR CT HEAD LTD  10/03/2022   IR PERCUTANEOUS ART THROMBECTOMY/INFUSION INTRACRANIAL INC DIAG ANGIO  10/03/2022   IR US GUIDE VASC ACCESS RIGHT  10/03/2022   LEFT HEART CATH AND CORS/GRAFTS ANGIOGRAPHY N/A 09/13/2020   Procedure: LEFT HEART CATH AND CORS/GRAFTS ANGIOGRAPHY;  Surgeon: Swaziland, Peter M, MD;  Location: [facility] INVASIVE CV LAB;  Service: Cardiovascular;  Laterality: N/A;   LEFT HEART CATHETERIZATION WITH CORONARY ANGIOGRAM N/A 01/06/2013   Procedure: LEFT HEART CATHETERIZATION WITH CORONARY ANGIOGRAM;  Surgeon: Peter M Swaziland, MD;  Location: [facility] CATH LAB;  Service: Cardiovascular;  Laterality: N/A;   LEFT HEART CATHETERIZATION WITH CORONARY ANGIOGRAM N/A 01/21/2013   Procedure: LEFT HEART CATHETERIZATION WITH CORONARY ANGIOGRAM;  Surgeon: Kathleene Hazel, MD;  Location: [facility] CATH LAB;  Service: Cardiovascular;  Laterality: N/A;   LUMBAR LAMINECTOMY/ DECOMPRESSION WITH MET-RX Right 06/30/2018   Procedure: Right minimally  invasive Lumbar Three-Four Far lateral discectomy;  Surgeon: Jadene Pierini, MD;  Location: [facility] OR;  Service: Neurosurgery;  Laterality: Right;  Right minimally invasive Lumbar Three-Four Far lateral discectomy   MASS EXCISION Right 07/21/2014   Procedure: EXCISION RIGHT NECK MASS;  Surgeon: Harriette Bouillon, MD;  Location: [facility];  Service: General;  Laterality: Right;   PERICARDIAL TAP N/A 01/24/2013   Procedure: PERICARDIAL TAP;  Surgeon: Micheline Chapman, MD;  Location: [facility] CATH LAB;  Service: Cardiovascular;  Laterality: N/A;   RIGHT HEART CATHETERIZATION Right 01/24/2013   Procedure: RIGHT HEART CATH;  Surgeon: Micheline Chapman, MD;  Location: [facility] CATH LAB;  Service: Cardiovascular;  Laterality: Right;   VENTRICULAR ANEURYSM RESECTION N/A 01/28/2013   Procedure: LEFT VENTRICULAR ANEURYSM REPAIR;  Surgeon: Loreli Slot, MD;  Location: [facility] OR;  Service: Open Heart Surgery;  Laterality: N/A;   HPI:  [name] is a 62 y.o. African American male with PMH of CAD, CHF s/p ICD, HLD, strokes, smoker presented to ED for code stroke. Progressed hemorrhage in the left frontal parietal region with 3 cm  hematoma and regional subarachnoid hemorrhage. Probable underlying  cortex infarct.  Underwent DIAGNOSTIC CEREBRAL ANGIOGRAM AND MECHANICAL THROMBECTOMY on 6/19 due to worsening of symptoms. F?u MRI also  shows Small acute right cerebellar infarct. Pt has a history of unexplained gagging in 2016. Had MBS, SLP recommended chin tuck?   Assessment / Plan / Recommendation Clinical Impression  Pt demonstrates severe expressive and receptive language impairment. Pts verbalizations are fluid and automatic but are characterized by repetitive stereotypy without awareness. When asked a Y/N question pt occasionally responds yes appropriately with a head nod, but without accuracy. Often when cued with Y/N pt responds with stereotypy. Unable to guide pt in following commands with visual  cues or pointing to express wants or needs.  Pt did not seem to recognize objects or reach for objects without max physical assist. Severe right inattention and left gaze preference.  Dysarthria severe with right CN VII weakness and likely XII and X as well though difficult to assess. WIll f/u acutely.    SLP Assessment  SLP Recommendation/Assessment: Patient needs continued Speech Lanaguage Pathology Services SLP Visit Diagnosis: Dysarthria and anarthria (R47.1);Cognitive communication deficit (R41.841);Aphasia (R47.01)    Recommendations for follow up therapy are one component of a multi-disciplinary discharge planning process, led by the attending physician.  Recommendations may be updated based on patient status, additional functional criteria and insurance authorization.    Follow Up Recommendations  Acute inpatient rehab (3hours/day)    Assistance Recommended at Discharge     Functional Status Assessment    Frequency and Duration min 2x/week  2 weeks      SLP Evaluation Cognition  Overall Cognitive Status: Impaired/Different from baseline Arousal/Alertness: Awake/alert Orientation Level: Oriented to person;Disoriented to place;Disoriented to time;Disoriented to situation Attention: Focused Focused Attention: Appears intact Awareness: Impaired Awareness Impairment: Intellectual impairment;Emergent impairment Problem Solving: Impaired Problem Solving Impairment: Verbal basic;Functional basic       Comprehension  Auditory Comprehension Overall Auditory Comprehension: Impaired Yes/No Questions: Impaired Basic Biographical Questions: 0-25% accurate Commands: Impaired One Step Basic Commands: 0-24% accurate Visual Recognition/Discrimination Discrimination: Exceptions to [facility] Common Objects: Unable to indentify Reading Comprehension Reading Status: Impaired Word level: Impaired    Expression Verbal Expression Overall Verbal Expression: Impaired Initiation: No  impairment Level of Generative/Spontaneous Verbalization:  (sounds) Repetition: Impaired Level of Impairment: Word level Naming: Impairment Common Objects: Unable to indentify Verbal Errors: Not aware of errors;Echolalia (stereotypy) Interfering Components: Speech intelligibility   Oral / Motor  Oral Motor/Sensory Function Overall Oral Motor/Sensory Function: Severe impairment Facial ROM: Reduced right;Suspected CN VII (facial) dysfunction Facial Symmetry: Abnormal symmetry right;Suspected CN VII (facial) dysfunction Facial Strength: Reduced right;Suspected CN VII (facial) dysfunction Lingual ROM: Suspected CN XII (hypoglossal) dysfunction Velum: Suspected CN X (Vagus) dysfunction Motor Speech Overall Motor Speech: Impaired Resonance: Hypernasality Articulation: Impaired Level of Impairment: Word Intelligibility: Intelligibility reduced Word: 0-24% accurate Motor Speech Errors: Unaware            Nicolas Banh, [name] 10/03/2022, 2:28 PM

## 2022-10-03 NOTE — Procedures (Signed)
INTERVENTIONAL NEURORADIOLOGY BRIEF POSTPROCEDURE NOTE  DIAGNOSTIC CEREBRAL ANGIOGRAM AND MECHANICAL THROMBECTOMY  Attending: Dr. Baldemar Lenis  Diagnosis: Left M3/MCA occlusion  Access site: RCFA  Access closure: Perclose prostyle  Anesthesia: GETA  Medication used: Refer to anesthesia documentation.  Complications: Small SAH vs contrast extravasation.  Estimated blood loss: Minimal.  Specimen: None.  Findings: Left M3/MCA occlusion. Mechanical thrombectomy performed with direct contact aspiration achieving complete recanalization after one pass (TICI 3). Small SAH vs contrast extravasation in the left Sylvian fissure.   The patient tolerated the procedure well and was extubated. He was transferred to PACU in stable condition.   PLAN: - SBP 100-140 mmHg - Bed rest x 6 hours post femoral puncture - Head CT at 5:30 am  -  Communicate if progression of bleed

## 2022-10-03 NOTE — Procedures (Signed)
Cortrak  Tube Type:  Cortrak - 43 inches Tube Location:  Left nare Initial Placement:  Stomach Secured by: Bridle Technique Used to Measure Tube Placement:  Marking at nare/corner of mouth Cortrak Secured At:  66 cm   Cortrak Tube Team Note:  Consult received to place a Cortrak feeding tube.   X-ray is required, abdominal x-ray has been ordered by the Cortrak team. Please confirm tube placement before using the Cortrak tube.   If the tube becomes dislodged please keep the tube and contact the Cortrak team at [email or web address] for replacement.  If after hours and replacement cannot be delayed, place a NG tube and confirm placement with an abdominal x-ray.   Kharter Sestak MS, RD, LDN Please refer to AMION for RD and/or RD on-call/weekend/after hours pager   

## 2022-10-03 NOTE — Anesthesia Procedure Notes (Signed)
Procedure Name: Intubation Date/Time: 10/03/2022 1:57 AM  Performed by: Alease Medina, CRNAPre-anesthesia Checklist: Patient identified, Emergency Drugs available, Suction available and Patient being monitored Patient Re-evaluated:Patient Re-evaluated prior to induction Oxygen Delivery Method: Circle system utilized Preoxygenation: Pre-oxygenation with 100% oxygen Induction Type: IV induction Ventilation: Mask ventilation without difficulty Laryngoscope Size: Mac and 4 Grade View: Grade I Tube type: Oral Tube size: 7.5 mm Number of attempts: 1 Airway Equipment and Method: Stylet and Oral airway Placement Confirmation: ETT inserted through vocal cords under direct vision, positive ETCO2 and breath sounds checked- equal and bilateral Secured at: 23 cm Tube secured with: Tape Dental Injury: Teeth and Oropharynx as per pre-operative assessment

## 2022-10-04 ENCOUNTER — Inpatient Hospital Stay (HOSPITAL_COMMUNITY): Payer: Medicare PPO

## 2022-10-04 ENCOUNTER — Encounter (HOSPITAL_COMMUNITY): Payer: Self-pay | Admitting: Interventional Radiology

## 2022-10-04 DIAGNOSIS — I6300 Cerebral infarction due to thrombosis of unspecified precerebral artery: Secondary | ICD-10-CM

## 2022-10-04 LAB — CBC
HCT: 41.7 % (ref 39.0–52.0)
Hemoglobin: 13.7 g/dL (ref 13.0–17.0)
MCH: 29.3 pg (ref 26.0–34.0)
MCHC: 32.9 g/dL (ref 30.0–36.0)
MCV: 89.3 fL (ref 80.0–100.0)
Platelets: 110 10*3/uL — ABNORMAL LOW (ref 150–400)
RBC: 4.67 MIL/uL (ref 4.22–5.81)
RDW: 13.2 % (ref 11.5–15.5)
WBC: 9.4 10*3/uL (ref 4.0–10.5)
nRBC: 0 % (ref 0.0–0.2)

## 2022-10-04 LAB — BASIC METABOLIC PANEL
Anion gap: 9 (ref 5–15)
BUN: 9 mg/dL (ref 8–23)
CO2: 21 mmol/L — ABNORMAL LOW (ref 22–32)
Calcium: 8.2 mg/dL — ABNORMAL LOW (ref 8.9–10.3)
Chloride: 112 mmol/L — ABNORMAL HIGH (ref 98–111)
Creatinine, Ser: 1.04 mg/dL (ref 0.61–1.24)
GFR, Estimated: >60 mL/min (ref >60)
Glucose, Bld: 104 mg/dL — ABNORMAL HIGH (ref 70–99)
Potassium: 4.1 mmol/L (ref 3.5–5.1)
Sodium: 142 mmol/L (ref 135–145)

## 2022-10-04 LAB — URINALYSIS, ROUTINE W REFLEX MICROSCOPIC
Bacteria, UA: NONE SEEN
Bilirubin Urine: NEGATIVE
Glucose, UA: >=500 mg/dL — AB
Ketones, ur: NEGATIVE mg/dL
Leukocytes,Ua: NEGATIVE
Nitrite: NEGATIVE
Protein, ur: NEGATIVE mg/dL
Specific Gravity, Urine: 1.02 (ref 1.005–1.030)
pH: 5 (ref 5.0–8.0)

## 2022-10-04 LAB — SODIUM
Sodium: 141 mmol/L (ref 135–145)
Sodium: 144 mmol/L (ref 135–145)
Sodium: 145 mmol/L (ref 135–145)

## 2022-10-04 LAB — PHOSPHORUS
Phosphorus: 2.3 mg/dL — ABNORMAL LOW (ref 2.5–4.6)
Phosphorus: 2.3 mg/dL — ABNORMAL LOW (ref 2.5–4.6)

## 2022-10-04 LAB — MAGNESIUM
Magnesium: 2 mg/dL (ref 1.7–2.4)
Magnesium: 2.2 mg/dL (ref 1.7–2.4)

## 2022-10-04 MED ORDER — ENOXAPARIN SODIUM 40 MG/0.4ML IJ SOSY: 40.0000 mg | PREFILLED_SYRINGE | INTRAMUSCULAR | Status: DC

## 2022-10-04 MED ORDER — ATORVASTATIN CALCIUM 80 MG PO TABS
80.0000 mg | ORAL_TABLET | Freq: Every day | ORAL | Status: DC
  Administered 2022-10-04 – 2022-10-06 (×3): 80 mg
  Filled 2022-10-04 (×3): qty 1

## 2022-10-04 MED ORDER — VITAL 1.5 CAL PO LIQD
1000.0000 mL | ORAL | Status: DC
  Administered 2022-10-04: 1000 mL

## 2022-10-04 MED ORDER — ADULT MULTIVITAMIN W/MINERALS CH
1.0000 | ORAL_TABLET | Freq: Every day | ORAL | Status: DC
  Administered 2022-10-04 – 2022-10-30 (×26): 1
  Filled 2022-10-04 (×27): qty 1

## 2022-10-04 MED ORDER — PANTOPRAZOLE SODIUM 40 MG IV SOLR
40.0000 mg | Freq: Every day | INTRAVENOUS | Status: DC
  Administered 2022-10-04 – 2022-10-20 (×17): 40 mg via INTRAVENOUS
  Filled 2022-10-04 (×17): qty 10

## 2022-10-04 MED ORDER — POTASSIUM & SODIUM PHOSPHATES 280-160-250 MG PO PACK
2.0000 | PACK | Freq: Once | ORAL | Status: AC
  Administered 2022-10-04: 2
  Filled 2022-10-04 (×2): qty 2

## 2022-10-04 MED ORDER — PROSOURCE TF20 ENFIT COMPATIBL EN LIQD
60.0000 mL | Freq: Two times a day (BID) | ENTERAL | Status: DC
  Administered 2022-10-04 – 2022-10-05 (×2): 60 mL
  Filled 2022-10-04 (×2): qty 60

## 2022-10-04 MED ORDER — HEPARIN SODIUM (PORCINE) 5000 UNIT/ML IJ SOLN
5000.0000 [IU] | Freq: Three times a day (TID) | INTRAMUSCULAR | Status: DC
  Administered 2022-10-04 – 2022-10-12 (×23): 5000 [IU] via SUBCUTANEOUS
  Filled 2022-10-04 (×24): qty 1

## 2022-10-04 NOTE — Progress Notes (Addendum)
Modified Barium Swallow Study  Patient Details  Name: [name] MRN: [id number] Date of Birth: [birth year]  Today's Date: 10/04/2022  Modified Barium Swallow completed.  Full report located under Chart Review in the Imaging Section.  History of Present Illness [name] is a 62 y.o. African American male with PMH of CAD, CHF s/p ICD, HLD, strokes, smoker presented to ED for code stroke. Progressed hemorrhage in the left frontal parietal region with 3 cm  hematoma and regional subarachnoid hemorrhage. Probable underlying  cortex infarct.  Underwent DIAGNOSTIC CEREBRAL ANGIOGRAM AND MECHANICAL THROMBECTOMY on 6/19 due to worsening of symptoms. F?u MRI also shows Small acute right cerebellar infarct. Pt has a history of unexplained gagging in 2016. Had MBS, SLP recommended chin tuck?   Clinical Impression Pt demonstrates a moderate oral dysphagia due to right CN VII and XII weakness. Tongue protrudes from gums on right (Note in imaging report it is incorrectly stated that weakness is on the left).  Did not test chewable solid as pt did not demonstrate enough awareness or lingual control and does not have denittion. Will need to trial this gradually at bedside.  There is pooling of liquids in the left lateral sulcus and floor of mouth. Pt accepts any method of intake but is able to sip from a straw well after some groping effort.  Pt uses repetitieve lingual thrusting to gradually propel liquid and puree boluses with pooling in the pyriform sinuses before the swallow. This leads to sensed aspiration before the swallow with thin liquids consumed sequentially. Pt tolerated large sequential sips of nectar thick liquids from a straw without aspiration. Sensation of aspiration is immediate and cough, though impaired, is strong and effective to eject. Pill lodged mid esophagus, pt needed large sips to dilate the esophagus to pass the pill. Recommend dys 1/nectar thick liquids. Factors that may  increase risk of adverse event in presence of aspiration [name] & [name] 2021): Limited mobility;Inadequate oral hygiene;Dependence for feeding and/or oral hygiene  Swallow Evaluation Recommendations Recommendations: PO diet PO Diet Recommendation: Dysphagia 1 (Pureed);Mildly thick liquids (Level 2, nectar thick) Medication Administration: Crushed with puree Supervision: Staff to assist with self-feeding Swallowing strategies  : Slow rate;Small bites/sips;Check for anterior loss;Check for pocketing or oral holding Postural changes: Stay upright 30-60 min after meals Oral care recommendations: Oral care BID (2x/day) Caregiver Recommendations: Have oral suction available      [name], [name] 10/04/2022,1:30 PM

## 2022-10-04 NOTE — Progress Notes (Addendum)
STROKE TEAM PROGRESS NOTE   INTERVAL HISTORY His family is not at the bedside.    Patient had acute mental status change over night. A CTH was ordered and showed increase in size of L frontal/parietal hematoma. With new extension into L lateral ventricle.   His exam this AM appears improved from his acute decompensation. He is able to intermittently follow simple commands, he attempts to speak, though his speech remains dysarthric. Moves L leg and Rl eg without difficulty. Moves. LUE.  He has been started on hypertonic saline and serum sodium this morning is 144. Vitals:   10/04/22 1133 10/04/22 1200 10/04/22 1300 10/04/22 1400  BP:  125/77 129/67 122/84  Pulse:  74 66 61  Resp:  (!) 28  14  Temp: 99.1 F (37.3 C)     TempSrc: Oral     SpO2:  96% 94% 93%  Weight:      Height:       CBC:  Recent Labs  Lab 10/02/22 1228 10/02/22 1236 10/04/22 0525  WBC 5.5  --  9.4  NEUTROABS 2.1  --   --   HGB 16.7 17.3* 13.7  HCT 50.0 51.0 41.7  MCV 88.0  --  89.3  PLT 148*  --  110*    Basic Metabolic Panel:  Recent Labs  Lab 10/02/22 1228 10/02/22 1236 10/03/22 1700 10/04/22 0713 10/04/22 1355  NA 139 139   < > 142 144  K 4.0 3.9  --  4.1  --   CL 101 105  --  112*  --   CO2 23  --   --  21*  --   GLUCOSE 81 79  --  104*  --   BUN 9 9  --  9  --   CREATININE 1.10 1.10  --  1.04  --   CALCIUM 9.4  --   --  8.2*  --   MG  --   --    < > 2.0 2.2  PHOS  --   --    < > 2.3* 2.3*   < > = values in this interval not displayed.    Lipid Panel:  Recent Labs  Lab 10/03/22 0058  CHOL 124  TRIG 67  HDL 48  CHOLHDL 2.6  VLDL 13  LDLCALC 63    HgbA1c:  Recent Labs  Lab 10/03/22 0058  HGBA1C 5.8*    Urine Drug Screen:  Recent Labs  Lab 10/02/22 2140  LABOPIA NONE DETECTED  COCAINSCRNUR NONE DETECTED  LABBENZ NONE DETECTED  AMPHETMU NONE DETECTED  THCU NONE DETECTED  LABBARB NONE DETECTED     Alcohol Level  Recent Labs  Lab 10/02/22 1228  ETH <10      IMAGING past 24 hours DG Swallowing Func-Speech Pathology  Result Date: 10/04/2022 Table formatting from the original result was not included. Modified Barium Swallow Study Patient Details Name: [name] MRN: [id number] Date of Birth: October 13, 1960 Today's Date: 10/04/2022 HPI/PMH: HPI: [name] is a 62 y.o. African American male with PMH of CAD, CHF s/p ICD, HLD, strokes, smoker presented to ED for code stroke. Progressed hemorrhage in the left frontal parietal region with 3 cm  hematoma and regional subarachnoid hemorrhage. Probable underlying  cortex infarct.  Underwent DIAGNOSTIC CEREBRAL ANGIOGRAM AND MECHANICAL THROMBECTOMY on 6/19 due to worsening of symptoms. F?u MRI also shows Small acute right cerebellar infarct. Pt has a history of unexplained gagging in 2016. Had MBS, SLP recommended chin tuck? Clinical  Impression: Pt demonstrates a moderate oral dysphagia due to left CN VII and XII weakness. Tongue protrudes from gums on left.  Did not test chewable solid as pt did not demonstrate enough awareness or lingual control and does not have denittion. Will need to trial this gradually at bedside.  There is pooling of liquids in the left lateral sulcus and floor of mouth. Pt accepts any method of intake but is able to sip from a straw well after some groping effort.  Pt uses repetitieve lingual thrusting to gradually propel liquid and puree boluses with pooling in the pyriform sinuses before the swallow. This leads to sensed aspiration before the swallow with thin liquids consumed sequentially. Pt tolerated large sequential sips of nectar thick liquids from a straw without aspiration. Sensation of aspiration is immediate and cough, though impaired, is strong and effective to eject. Pill lodged mid esophagus, pt needed large sips to dilate the esophagus to pass the pill. Recommend dys 1/nectar thick liquids. Factors that may increase risk of adverse event in presence of aspiration [name] &  [name] 2021): Factors that may increase risk of adverse event in presence of aspiration [name] & [name] 2021): Limited mobility; Inadequate oral hygiene; Dependence for feeding and/or oral hygiene Recommendations/Plan: Swallowing Evaluation Recommendations Swallowing Evaluation Recommendations Recommendations: PO diet PO Diet Recommendation: Dysphagia 1 (Pureed); Mildly thick liquids (Level 2, nectar thick) Medication Administration: Crushed with puree Supervision: Staff to assist with self-feeding Swallowing strategies  : Slow rate; Small bites/sips; Check for anterior loss; Check for pocketing or oral holding Postural changes: Stay upright 30-60 min after meals Oral care recommendations: Oral care BID (2x/day) Caregiver Recommendations: Have oral suction available Treatment Plan Treatment Plan Treatment recommendations: Therapy as outlined in treatment plan below Follow-up recommendations: Acute inpatient rehab (3 hours/day) Functional status assessment: Patient has had a recent decline in their functional status and demonstrates the ability to make significant improvements in function in a reasonable and predictable amount of time. Treatment frequency: Min 2x/week Treatment duration: 2 weeks Interventions: Aspiration precaution training; Compensatory techniques; Patient/family education; Trials of upgraded texture/liquids; Diet toleration management by SLP Recommendations Recommendations for follow up therapy are one component of a multi-disciplinary discharge planning process, led by the attending physician.  Recommendations may be updated based on patient status, additional functional criteria and insurance authorization. Assessment: Orofacial Exam: Orofacial Exam Oral Cavity - Dentition: Dentures, top; Dentures, bottom; Edentulous Anatomy: Anatomy: WFL Boluses Administered: Boluses Administered Boluses Administered: Thin liquids (Level 0); Mildly thick liquids (Level 2, nectar thick); Puree; Solid;  Moderately thick liquids (Level 3, honey thick)  Oral Impairment Domain: Oral Impairment Domain Lip Closure: Escape from interlabial space or lateral juncture, no extension beyond vermillion border Tongue control during bolus hold: Not tested Bolus preparation/mastication: Minimal chewing/mashing with majority of bolus unchewed Bolus transport/lingual motion: Repetitive/disorganized tongue motion Oral residue: Residue collection on oral structures Location of oral residue : Floor of mouth; Lateral sulci Initiation of pharyngeal swallow : Pyriform sinuses  Pharyngeal Impairment Domain: Pharyngeal Impairment Domain Soft palate elevation: No bolus between soft palate (SP)/pharyngeal wall (PW) Laryngeal elevation: Complete superior movement of thyroid cartilage with complete approximation of arytenoids to epiglottic petiole Anterior hyoid excursion: Complete anterior movement Epiglottic movement: Complete inversion Laryngeal vestibule closure: Complete, no air/contrast in laryngeal vestibule Pharyngeal stripping wave : Present - complete Tongue base retraction: No contrast between tongue base and posterior pharyngeal wall (PPW) Pharyngeal residue: Trace residue within or on pharyngeal structures Location of pharyngeal residue: Pyriform sinuses  Esophageal Impairment Domain:  Esophageal Impairment Domain Esophageal clearance upright position: Esophageal retention Pill: Pill Consistency administered: Puree Puree: Impaired (see clinical impressions) Penetration/Aspiration Scale Score: Penetration/Aspiration Scale Score 1.  Material does not enter airway: Thin liquids (Level 0); Mildly thick liquids (Level 2, nectar thick); Moderately thick liquids (Level 3, honey thick); Puree; Pill 8.  Material enters airway, passes BELOW cords without attempt by patient to eject out (silent aspiration) : Thin liquids (Level 0) Compensatory Strategies: No data recorded  General Information: Caregiver present: No  Diet Prior to this Study:  NPO; Cortrak/Small bore NG tube   Temperature : Normal   Respiratory Status: WFL   Supplemental O2: None (Room air)   History of Recent Intubation: No  Behavior/Cognition: Alert; Requires cueing Self-Feeding Abilities: Needs assist with self-feeding Baseline vocal quality/speech: Abnormal resonance Volitional Cough: Able to elicit Volitional Swallow: Unable to elicit No data recorded Goal Planning: Prognosis for improved oropharyngeal function: Fair Barriers to Reach Goals: Severity of deficits No data recorded No data recorded Consulted and agree with results and recommendations: Pt unable/family or caregiver not available Pain: Pain Assessment Pain Assessment: Faces Faces Pain Scale: 0 Facial Expression: 0 Body Movements: 0 Muscle Tension: 0 Compliance with ventilator (intubated pts.): N/A Vocalization (extubated pts.): 0 CPOT Total: 0 End of Session: Start Time:SLP Start Time (ACUTE ONLY): 1200 Stop Time: SLP Stop Time (ACUTE ONLY): 1230 Time Calculation:SLP Time Calculation (min) (ACUTE ONLY): 30 min Charges: SLP Evaluations $ SLP Speech Visit: 1 Visit SLP Evaluations $BSS Swallow: 1 Procedure $MBS Swallow: 1 Procedure $ SLP EVAL LANGUAGE/SOUND PRODUCTION: 1 Procedure $Swallowing Treatment: 1 Procedure $Speech Treatment for Individual: 1 Procedure SLP visit diagnosis: SLP Visit Diagnosis: Dysphagia, oropharyngeal phase (R13.12) Past Medical History: Past Medical History: Diagnosis Date  Abnormal EKG   hx of ischemia showing up on ekg's  Acute myopericarditis   a. 03/2013: readm with hypoxia, tachycardia, elevated ESR/CRP, elevated troponin with Dressler syndrome and myopericarditis; treated with steroids.  Acute respiratory failure (HCC)   a. 01/2013: VDRF. b. 03/2013: hypoxia requiring supp O2 during adm, resolved by discharge.  C. difficile colitis   a. 01/2013 during prolonged adm. b. Recurred 03/2013.  Cardiac tamponade   a. 01/2013 s/p drain.  Cataracts, bilateral   Coronary artery disease   a. s/p MI in  2009 in [place] with stenting of the LCX and LAD;  b. 12/2012 NSTEMI/CAD: LM nl, LAD patent prox stent, LCX 50-70 isr (FFR 0.84), RCA dom, 15m, EF 40-45%-->Med Rx. c. 01/2013: anterolateral STEMI complicated by pericardial effusion (presumed purulent pericarditis) and tamponade s/p drain, ruptured LV pseudoaneurysm s/p CorMatrix patch, CABGx1 (SVG-OM1), fever, CVA, VDRF, C diff.  CVA (cerebral infarction)   a. 01/2013 in setting of prolonged hospitalization - residual L arm weakness.  Dressler syndrome (HCC)   a. 03/2013: readm with hypoxia, tachycardia, elevated ESR/CRP, elevated troponin with Dressler syndrome and myopericarditis; treated with steroids.  Glaucoma   Hyperlipidemia   Hypokalemia   Hyponatremia   a. During late 2014.  Ischemic cardiomyopathy   a. Sept/Oct 2014: EF ~40% (ICM). b. 03/2013: EF 25-30%. Off ACEI due to hypotension (MIXED NICM/ICM).  Optic neuropathy, ischemic   Pericardial effusion   a. 01/2013: pericardial effusion (presumed purulent pericarditis) and cardiac tamponade s/p drain. b. Persistent moderate pericardial effusion 03/2013.  Pre-diabetes   Pseudoaneurysm of left ventricle of heart   a. 01/2013:  Ruptured inferoposterior LV pseudoaneurysm s/p CorMatrix patch.  Sinus bradycardia   asymptomatic  Stroke [facility]) 2009  weak lt side-lt arm  Tobacco abuse  Wears dentures   top Past Surgical History: Past Surgical History: Procedure Laterality Date  CARDIAC CATHETERIZATION  01/06/2013  CATARACT EXTRACTION Bilateral 05/2021  COLONOSCOPY WITH PROPOFOL N/A 07/03/2016  Procedure: COLONOSCOPY WITH PROPOFOL;  Surgeon: Ruffin Frederick, MD;  Location: [facility];  Service: Gastroenterology;  Laterality: N/A;  CORONARY ANGIOPLASTY WITH STENT PLACEMENT  2000's X 2  "1 + 1" (01/06/2013)  CORONARY ARTERY BYPASS GRAFT N/A 01/28/2013  Procedure: CORONARY ARTERY BYPASS GRAFTING (CABG);  Surgeon: Loreli Slot, MD;  Location: [facility] OR;  Service: Open Heart Surgery;  Laterality: N/A;  CABG x one,  using left greater saphenous vein harvested endoscopically  ICD IMPLANT N/A 05/25/2020  Procedure: ICD IMPLANT;  Surgeon: Hillis Range, MD;  Location: [facility] INVASIVE CV LAB;  Service: Cardiovascular;  Laterality: N/A;  INTRAOPERATIVE TRANSESOPHAGEAL ECHOCARDIOGRAM N/A 01/28/2013  Procedure: INTRAOPERATIVE TRANSESOPHAGEAL ECHOCARDIOGRAM;  Surgeon: Loreli Slot, MD;  Location: [facility] OR;  Service: Open Heart Surgery;  Laterality: N/A;  IR CT HEAD LTD  10/03/2022  IR PERCUTANEOUS ART THROMBECTOMY/INFUSION INTRACRANIAL INC DIAG ANGIO  10/03/2022  IR US GUIDE VASC ACCESS RIGHT  10/03/2022  LEFT HEART CATH AND CORS/GRAFTS ANGIOGRAPHY N/A 09/13/2020  Procedure: LEFT HEART CATH AND CORS/GRAFTS ANGIOGRAPHY;  Surgeon: Swaziland, Peter M, MD;  Location: [facility] INVASIVE CV LAB;  Service: Cardiovascular;  Laterality: N/A;  LEFT HEART CATHETERIZATION WITH CORONARY ANGIOGRAM N/A 01/06/2013  Procedure: LEFT HEART CATHETERIZATION WITH CORONARY ANGIOGRAM;  Surgeon: Peter M Swaziland, MD;  Location: [facility] CATH LAB;  Service: Cardiovascular;  Laterality: N/A;  LEFT HEART CATHETERIZATION WITH CORONARY ANGIOGRAM N/A 01/21/2013  Procedure: LEFT HEART CATHETERIZATION WITH CORONARY ANGIOGRAM;  Surgeon: Kathleene Hazel, MD;  Location: [facility] CATH LAB;  Service: Cardiovascular;  Laterality: N/A;  LUMBAR LAMINECTOMY/ DECOMPRESSION WITH MET-RX Right 06/30/2018  Procedure: Right minimally invasive Lumbar Three-Four Far lateral discectomy;  Surgeon: Jadene Pierini, MD;  Location: [facility] OR;  Service: Neurosurgery;  Laterality: Right;  Right minimally invasive Lumbar Three-Four Far lateral discectomy  MASS EXCISION Right 07/21/2014  Procedure: EXCISION RIGHT NECK MASS;  Surgeon: Harriette Bouillon, MD;  Location: [facility];  Service: General;  Laterality: Right;  PERICARDIAL TAP N/A 01/24/2013  Procedure: PERICARDIAL TAP;  Surgeon: Micheline Chapman, MD;  Location: [facility] CATH LAB;  Service: Cardiovascular;  Laterality: N/A;  RADIOLOGY WITH  ANESTHESIA N/A 10/03/2022  Procedure: IR WITH ANESTHESIA;  Surgeon: Julieanne Cotton, MD;  Location: [facility] OR;  Service: Radiology;  Laterality: N/A;  RIGHT HEART CATHETERIZATION Right 01/24/2013  Procedure: RIGHT HEART CATH;  Surgeon: Micheline Chapman, MD;  Location: [facility] CATH LAB;  Service: Cardiovascular;  Laterality: Right;  VENTRICULAR ANEURYSM RESECTION N/A 01/28/2013  Procedure: LEFT VENTRICULAR ANEURYSM REPAIR;  Surgeon: Loreli Slot, MD;  Location: [facility] OR;  Service: Open Heart Surgery;  Laterality: N/A; DeBlois, [name] 10/04/2022, 1:32 PM  CT HEAD WO CONTRAST ( )  Result Date: 10/03/2022 CLINICAL DATA:  Altered mental status EXAM: CT HEAD WITHOUT CONTRAST TECHNIQUE: Contiguous axial images were obtained from the base of the skull through the vertex without intravenous contrast. RADIATION DOSE REDUCTION: This exam was performed according to the departmental dose-optimization program which includes automated exposure control, adjustment of the mA and/or kV according to patient size and/or use of iterative reconstruction technique. COMPARISON:  Brain MRI and head CT 10/03/2022 FINDINGS: Brain: As demonstrated on the earlier MRI, the left frontal/parietal hematoma has increased in size since the prior head CT. It now measures 8.2 x 3.9 x 3.9 cm. There is new extension into the  left lateral ventricle. The left convexity subarachnoid component have also worsened. Trace rightward midline shift. Vascular: No hyperdense vessel or unexpected calcification. Skull: Normal. Negative for fracture or focal lesion. Sinuses/Orbits: No acute finding. Other: None. IMPRESSION: 1. Since the prior head CT, increase in size of the left frontal/parietal hematoma, now measuring 8.2 x 3.9 x 3.9 cm. 2. New extension into the left lateral ventricle. Electronically Signed   By: [name] M.D.   On: 10/03/2022 19:51      Physical Exam  Constitutional: Appears well-developed and well-nourished.  Psych: Affect  appropriate to situation Eyes: No scleral injection HENT: No OP obstrucion Head: Normocephalic.  Cardiovascular: Normal rate and regular rhythm.  Respiratory: Effort normal and breath sounds normal to anterior ascultation GI: Soft.  No distension. There is no tenderness.  Skin: WDI  Neuro: Mental Status: Patient is awake, alert, speech is dysarthric. Unable to assess orientation  Follows simple commands intermittently  Cranial Nerves: II: Pupils are equal, round, and reactive to light.   III,IV, VI: EOMI without ptosis or diplopia.  V:Mild facial droop  VII: Face is symmetric resting and smiling.  VIII: Hearing is intact to voice X: Palate is midline and palate elevates symmetrically, phonation intact XII: tongue is midline without atrophy or fasciculations.  Motor: Tone is normal. Bulk is normal. Trace L sided weakness. RLE 3/5. RUE decreased strength 1/5    ASSESSMENT/PLAN [name] is a 62 y.o. male with history of CAD s/p PCI w/ stent to LCS and LAD, s/p 1v CABG, multiple CVAs (R cerebellar 04/2022),  syscolic HF s/p ICD, HLD, HTN presenting with slurred speech and L sided weakness now s/p TNK and IR thrombectomy of L MCA (M3 segment) c/b  hemorrhage.   Bilateral strokes s/p TNK initially for left hemiparesis and then several hours later developed new left hemispheric stroke with aphasia and right hemiparesis due to left M3 occlusion treated with mechanical thrombectomy complicated by L frontoparietal parenchymal and subarachnoid hemorrhage w/o midline shift or herniation  Numerous chronic cerebral and cerebellar infarcts TIA Etiology:  Atherosclerosis versus cardioembolic    Post IR CT 1. Successful mechanical thrombectomy performed for treatment of a left M3/MCA occlusion achieving complete recanalization (TICI 3). Hyperdensity within the left sylvian fissure may represent small subarachnoid hemorrhage versus contrast. CT Head: Progressed hemorrhage in L  frontoparietal region w/ 3cm hematoma and regional [facility] MRI  progressive L fronto parietal hematoma (~7cm), regional SAH, increased mass effect, no midline shift or herniation.  Likely underlying infarct. Sm R cerebellar infarct, acute. Numerous chornic cerebral and cerebellar infarcts CTA  short segment occlusion of distal L MCA M2 segment  2D Echo poor windows, unable to comment on EF. Focal RWMA. Known PFO Previously on 30 d monitor finished 05/2022-06/2022 no afib noted. No afib with ICD interrogation Will need ICD interrogated this hospitalization  Likely will need longer term monitor at discharge  Frequent neuro checks  Continue hypertonic saline as patient neuro status improved goal Na 150-155 LDL 63 HgbA1c 5.8 VTE prophylaxis - Start SQH     Diet   DIET - DYS 1 Room service appropriate? No; Fluid consistency: Nectar Thick   aspirin 81 mg daily and clopidogrel 75 mg daily prior to admission, now on No antithrombotic due to hemorrhage.  Therapy recommendations:  Pending Disposition:  ICU  Hypertension Home meds:  coreg 12.5mg  BID, entresto 97-103mg  BID, spironolactone 25mg  every day,  Stable, hold antihypertensives Goal SBP 100-140, bolus PRN, hold home antihypertensives  Long-term  BP goal normotensive  Hyperlipidemia Home meds:  atorvastatin, resumed in hospital LDL 63, goal < 70 Continue statin at discharge  Diabetes type II Controlled Home meds:  Farxiga  HgbA1c 5.8, goal < 7.0 CBGs Recent Labs    10/02/22 1227 10/02/22 1354  GLUCAP 89 122*      Other Active Problems Systolic HF, s/p ICD likely 2/2 ICM. Last EF 35 to 40% Hold GDMT due to lower blood pressures Febrile to 102.2 ON. No leukocytosis this AM. Some low platelets though >100k. Unable to tell team if has any symptoms. Appears to have O2 requirement on chart review, though not on O2 this AM.  -Will check cxr and procalcitonin   Hospital day # 2  Marolyn Haller, MD PGY-3 Internal Medicine Resident   Pager [phone]  STROKE MD NOTE :  I have personally obtained history,examined this patient, reviewed notes, independently viewed imaging studies, participated in medical decision making and plan of care.ROS completed by me personally and pertinent positives fully documented  I have made any additions or clarifications directly to the above note. Agree with note above.  Patient had neurological worsening and repeat CT scan shows expansion of the left temporoparietal hematoma with increasing cytotoxic edema and midline shift.  He was started on hypertonic saline and mental status did improve slightly.  Continue 3% saline with serum sodium goal 150-155.  Continue strict control of hypertension with systolic blood pressure goal below 160.  No family available at the bedside for discussion.This patient is critically ill and at significant risk of neurological worsening, death and care requires constant monitoring of vital signs, hemodynamics,respiratory and cardiac monitoring, extensive review of multiple databases, frequent neurological assessment, discussion with family, other specialists and medical decision making of high complexity.I have made any additions or clarifications directly to the above note.This critical care time does not reflect procedure time, or teaching time or supervisory time of PA/NP/Med Resident etc but could involve care discussion time.  I spent 30 minutes of neurocritical care time  in the care of  this patient.      Delia Heady, MD Medical Director [facility] Pager: [phone] 10/04/2022 5:11 PM  To contact Stroke Continuity provider, please refer to [email or web address]. After hours, contact General Neurology

## 2022-10-04 NOTE — Progress Notes (Signed)
Referring Provider(s): Marvel Plan  Supervising Physician: Baldemar Lenis  Patient Status:  [facility] - In-pt  Chief Complaint:  Code stroke   Brief History:  [name] is a 62 y.o. African American male who presented to the ED 10/02/2022 with left-sided weakness, slurred speech and dizziness; NIHSS 6.   Head CT was negative for acute infarct and CTA was negative for large vessel occlusion.   He received IV TNK.   Later throughout the day he developed stuttering aphasia with NIH fluctuating between 0-4.   Given transient nature of symptoms, family declined intervention.   Around 1 a.m. on 10/03/2022, he had a more prolonged episode of aphasia, NIHSS 8. At that time, family decided to proceed with intervention.  Findings: Left M3/MCA occlusion. Mechanical thrombectomy performed with direct contact aspiration achieving complete recanalization after one pass (TICI 3). Small SAH vs contrast extravasation in the left Sylvian fissure.   Post procedure CT showed = Progressed hemorrhage in the left frontal parietal region with 3 cm hematoma and regional subarachnoid hemorrhage. Probable underlying cortex infarct.    Subjective:  [name]. + right sided neglect. Mumbling.  Allergies: Patient has no known allergies.  Medications: Prior to Admission medications   Medication Sig Start Date End Date Taking? Authorizing Provider  ALPHAGAN P 0.1 % SOLN INSTILL 1 DROP INTO BOTH EYES TWICE A DAY Patient taking differently: Place 1 drop into both eyes in the morning and at bedtime. 10/26/19  Yes [name], MD  aspirin 81 MG EC tablet Take 1 tablet (81 mg total) by mouth daily. Restart on 07/05/2018 06/30/18  Yes Jadene Pierini, MD  atorvastatin (LIPITOR) 80 MG tablet Take 1 tablet (80 mg total) by mouth daily. 06/07/22  Yes [name], MD  carvedilol (COREG) 25 MG tablet Take 0.5 tablets (12.5 mg total) by mouth 2 (two) times daily with a  meal. Patient taking differently: Take 25 mg by mouth 2 (two) times daily with a meal. 05/08/22  Yes [name], MD  Cholecalciferol (VITAMIN D3) 50 MCG (2000 UT) TABS Take 1 tablet by mouth daily.   Yes [provider]  clopidogrel (PLAVIX) 75 MG tablet Take 1 tablet (75 mg total) by mouth daily. 06/20/22  Yes [name], MD  dapagliflozin propanediol (FARXIGA) 10 MG TABS tablet TAKE 1 TABLET EVERY DAY Patient taking differently: Take 10 mg by mouth daily. 12/20/21  Yes Allred, Fayrene Fearing, MD  docusate sodium (COLACE) 100 MG capsule Take 100 mg by mouth 2 (two) times daily.   Yes [provider]  ENTRESTO 97-103 MG TAKE 1 TABLET TWICE DAILY Patient taking differently: Take 1 tablet by mouth 2 (two) times daily. 05/02/22  Yes [place], Peter M, MD  furosemide (LASIX) 20 MG tablet Take 1 tablet (20 mg total) by mouth daily. 02/01/21  Yes [place], Peter M, MD  gabapentin (NEURONTIN) 300 MG capsule Take 1 capsule (300 mg total) by mouth at bedtime. 09/18/21  Yes [name], MD  latanoprost (XALATAN) 0.005 % ophthalmic solution Place 1 drop into both eyes at bedtime. 05/31/16  Yes [provider]  Multiple Vitamins-Minerals (CENTRUM MEN) TABS Take 1 tablet by mouth daily.   Yes [provider]  spironolactone (ALDACTONE) 25 MG tablet Take 0.5 tablets (12.5 mg total) by mouth daily. 05/08/22  Yes [name], MD  fluticasone (FLONASE) 50 MCG/ACT nasal spray Place 1 spray into both nostrils daily. 08/12/21   Carlisle Beers, FNP  nitroGLYCERIN (NITROSTAT) 0.4 MG SL tablet Place 1 tablet (0.4 mg total) under the tongue every 5 (five) minutes x 3 doses as needed for chest pain. 09/14/20   Arty Baumgartner, NP     Vital Signs: BP 131/70 (BP Location: Right Arm)   Pulse 67   Temp (!) 102.2 F (39 C) (Oral) Comment: tylenol given, ice packs reapplied  Resp (!) 21   Ht 6\' 1"  (1.854 m)   Wt 241 lb 6.5 oz (109.5 kg)   SpO2 98%   BMI 31.85 kg/m    Physical Exam Alert, awake, + aphasia w/ mumbling. Neglect  +facial asymmetry = Right droop Eyes do not cross midline. Unable to move right arm. Right leg with some movement. Normal movement left arm/leg Common femoral artery puncture site looks good, no bleeding, no hematoma, no pseudoaneurysm    Labs:  CBC: Recent Labs    05/04/22 0130 05/04/22 0242 10/02/22 1228 10/02/22 1236 10/04/22 0525  WBC 6.0  --  5.5  --  9.4  HGB 15.5 15.0 16.7 17.3* 13.7  HCT 44.2 44.0 50.0 51.0 41.7  PLT 123*  --  148*  --  110*    COAGS: Recent Labs    05/04/22 0130 10/02/22 1228  INR 1.2 1.1  APTT 30 30    BMP: Recent Labs    05/04/22 0130 05/04/22 0242 10/02/22 1228 10/02/22 1236 10/03/22 1700 10/04/22 0219 10/04/22 0713  NA 139 140 139 139 137 141 142  K 3.7 3.7 4.0 3.9  --   --  4.1  CL 102 103 101 105  --   --  112*  CO2 25  --  23  --   --   --  21*  GLUCOSE 114* 131* 81 79  --   --  104*  BUN 13 13 9 9   --   --  9  CALCIUM 9.5  --  9.4  --   --   --  8.2*  CREATININE 1.13 1.00 1.10 1.10  --   --  1.04  GFRNONAA >60  --  >60  --   --   --  >60    LIVER FUNCTION TESTS: Recent Labs    10/02/22 1228  BILITOT 0.6  AST 19  ALT 16  ALKPHOS 117  PROT 7.0  ALBUMIN 4.0    Assessment and Plan:  Left M3/MCA occlusion.   Mechanical thrombectomy performed with direct contact aspiration achieving complete recanalization after one pass (TICI 3). Small SAH vs contrast extravasation in the left Sylvian fissure.   Progressed hemorrhage in the left frontal parietal region with 3 cm hematoma and regional subarachnoid hemorrhage.   Care per Neurology.  Remove the groin dressing tomorrow.  Electronically Signed: Gwynneth Macleod, PA-C 10/04/2022, 9:53 AM    I spent a total of 15 Minutes at the the patient's bedside AND on the patient's hospital floor or unit, greater than 50% of which was counseling/coordinating care for f/u after cerebral intervention.

## 2022-10-04 NOTE — Progress Notes (Signed)
IV watch is alarming red. This RN assessed the site and found it to be non-edematous, normal color, good temperature. The IV has good blood return and flushes well without any sensitivity to the patient.  This RN reached out to IV team for next steps and they advised to replace the IV watch sticker as long as the IV site looked good and had good blood return. This RN replaced the sticker under this advisement.   10/04/2022 Oralia Manis, RN 6:58 PM

## 2022-10-04 NOTE — Evaluation (Signed)
Physical Therapy Evaluation Patient Details Name: [name] MRN: [id number] DOB: [birth year] Today's Date: 10/04/2022  History of Present Illness  Patient is a 62 y/o male admitted 10/03/22 with L weakness and numbness.  Received TNK then developed now symptoms of aphasia and R weakness.  He underwent mechanical thrombectomy L M3 segment MCA noted to have small hemorrhage and follow up CT showed progression of hemorrhage 3cm then 7cm and small acute R cerebellar infarct. PMH positive for CAD s/p PCI w/stent and 1v CABG, multiple CGAs, systolic HF s/p ICD, HLD, HTN.  Clinical Impression  Patient presents with decreased mobility due to R side weakness, decreased R side awareness with L gaze preference, decreased sitting and standing balance, decreased safety/deficit awareness and impaired communication.  Currently needing +2 to safely transition OOB to chair though sitting balance once positioned at EOB was with S.  Previously living alone with aide coming to assist with IADL's ?some BADL's (brother unsure), and ambulating with cane.  Brother assisted to provide transportation.  Patient will benefit from skilled PT in the acute setting.  May be candidate for intensive inpatient rehab though unsure if has 24/7 assist at d/c.         Recommendations for follow up therapy are one component of a multi-disciplinary discharge planning process, led by the attending physician.  Recommendations may be updated based on patient status, additional functional criteria and insurance authorization.  Follow Up Recommendations       Assistance Recommended at Discharge Frequent or constant Supervision/Assistance  Patient can return home with the following  Two people to help with walking and/or transfers;A lot of help with bathing/dressing/bathroom;Assistance with cooking/housework;Direct supervision/assist for medications management;Assist for transportation;Help with stairs or ramp for entrance;Assistance with  feeding    Equipment Recommendations Other (comment) (TBA)  Recommendations for Other Services  Rehab consult    Functional Status Assessment Patient has had a recent decline in their functional status and demonstrates the ability to make significant improvements in function in a reasonable and predictable amount of time.     Precautions / Restrictions Precautions Precautions: Fall Precaution Comments: R inattention; SBP <160      Mobility  Bed Mobility Overal bed mobility: Needs Assistance Bed Mobility: Supine to Sit     Supine to sit: HOB elevated, Max assist     General bed mobility comments: assist for legs off EOB and to lift trunk    Transfers Overall transfer level: Needs assistance   Transfers: Sit to/from Stand, Bed to chair/wheelchair/BSC Sit to Stand: +2 physical assistance, Mod assist, From elevated surface Stand pivot transfers: Max assist, +2 physical assistance         General transfer comment: up to stand almost wtih max A of 1 but needing +2 for fully upright; max cues to step with R foot but pt leaning R so lowered onto front L edge of chair and max A of 2 to scoot over and back into chair    Ambulation/Gait               General Gait Details: unable  Stairs            Wheelchair Mobility    Modified Rankin (Stroke Patients Only) Modified Rankin (Stroke Patients Only) Pre-Morbid Rankin Score: Moderate disability Modified Rankin: Severe disability     Balance Overall balance assessment: Needs assistance   Sitting balance-Leahy Scale: Fair Sitting balance - Comments: can sit unsupported when R foot positioned to R; after in chair and pt  with R foot to L pt falling to R   Standing balance support: Bilateral upper extremity supported Standing balance-Leahy Scale: Poor Standing balance comment: UE support and mod of 2 for standing static, max to total A for dynamic balance moving to recliner with heavy R lateral lean                              Pertinent Vitals/Pain Pain Assessment Facial Expression: Relaxed, neutral Body Movements: Absence of movements Muscle Tension: Relaxed Compliance with ventilator (intubated pts.): N/A Vocalization (extubated pts.): Talking in normal tone or no sound CPOT Total: 0    Home Living Family/patient expects to be discharged to:: Private residence Living Arrangements: Alone Available Help at Discharge: Personal care attendant (7 d/week, 4hours?) Type of Home: Apartment Home Access: Elevator     Alternate Level Stairs-Number of Steps: flight per brother, per previous notes 2 stes of 4 steps split level Home Layout: Two level;Other (Comment) Home Equipment: Rolling Walker (2 wheels);Gilmer Mor - single point Additional Comments: info from brother and from notes from prior visit in January    Prior Function Prior Level of Function : Needs assist             Mobility Comments: using a cane at baseline ADLs Comments: Aide assists with IADL's not sure about if helps with BADL (information from brother, Verdon Cummins)     Hand Dominance   Dominant Hand: Right    Extremity/Trunk Assessment   Upper Extremity Assessment Upper Extremity Assessment: RUE deficits/detail RUE Deficits / Details: moves hand toward communication board with some proximal strength, no intention of movment to command RUE: Unable to fully assess due to immobilization LUE Deficits / Details: WFL    Lower Extremity Assessment Lower Extremity Assessment: RLE deficits/detail RLE Deficits / Details: lifts antigravity from hip with increased time and cues but weakly and with more adductor use RLE Coordination: decreased gross motor    Cervical / Trunk Assessment Cervical / Trunk Assessment: Other exceptions Cervical / Trunk Exceptions: head turned to L, able to turn R but with cues and assist to notice chair on his R side  Communication   Communication: Receptive difficulties;Expressive  difficulties  Cognition Arousal/Alertness: Awake/alert Behavior During Therapy: Impulsive Overall Cognitive Status: Difficult to assess Area of Impairment: Attention, Safety/judgement, Awareness, Following commands                   Current Attention Level: Sustained   Following Commands: Follows one step commands inconsistently, Follows one step commands with increased time Safety/Judgement: Decreased awareness of safety, Decreased awareness of deficits Awareness: Intellectual   General Comments: R inattention, expressive, receptive aphasia        General Comments General comments (skin integrity, edema, etc.): VSS on 3L O2 throughout; BP 120's/80's after up in chair    Exercises     Assessment/Plan    PT Assessment Patient needs continued PT services  PT Problem List Decreased strength;Decreased balance;Decreased cognition;Decreased knowledge of use of DME;Decreased mobility;Decreased activity tolerance;Decreased coordination;Decreased safety awareness;Impaired sensation       PT Treatment Interventions DME instruction;Functional mobility training;Balance training;Patient/family education;Neuromuscular re-education;Therapeutic activities;Gait training;Therapeutic exercise;Cognitive remediation;Stair training    PT Goals (Current goals can be found in the Care Plan section)  Acute Rehab PT Goals Patient Stated Goal: to return home PT Goal Formulation: With patient/family Time For Goal Achievement: 10/18/22 Potential to Achieve Goals: Good    Frequency Min 4X/week  Co-evaluation               AM-PAC PT "6 Clicks" Mobility  Outcome Measure Help needed turning from your back to your side while in a flat bed without using bedrails?: Total Help needed moving from lying on your back to sitting on the side of a flat bed without using bedrails?: Total Help needed moving to and from a bed to a chair (including a wheelchair)?: Total Help needed standing up  from a chair using your arms (e.g., wheelchair or bedside chair)?: Total Help needed to walk in hospital room?: Total Help needed climbing 3-5 steps with a railing? : Total 6 Click Score: 6    End of Session Equipment Utilized During Treatment: Gait belt;Oxygen Activity Tolerance: Patient tolerated treatment well Patient left: in chair;with call bell/phone within reach;with chair alarm set Nurse Communication: Mobility status;Need for lift equipment PT Visit Diagnosis: Other abnormalities of gait and mobility (R26.89);Other symptoms and signs involving the nervous system (R29.898);Hemiplegia and hemiparesis Hemiplegia - Right/Left: Right Hemiplegia - dominant/non-dominant: Dominant Hemiplegia - caused by: Cerebral infarction;Other Nontraumatic intracranial hemorrhage    Time: 1015-1046 PT Time Calculation (min) (ACUTE ONLY): 31 min   Charges:   PT Evaluation $PT Eval Moderate Complexity: 1 Mod PT Treatments $Therapeutic Activity: 8-22 mins        Sheran Lawless, PT Acute Rehabilitation Services Office:[phone] 10/04/2022   Elray Mcgregor 10/04/2022, 12:57 PM

## 2022-10-04 NOTE — Progress Notes (Signed)
Speech Language Pathology Treatment: Cognitive-Linquistic  Patient Details Name: [name] MRN: [id number] DOB: 1960/08/20 Today's Date: 10/04/2022 Time: 1030-1040 SLP Time Calculation (min) (ACUTE ONLY): 10 min  Assessment / Plan / Recommendation Clinical Impression  Pt had been positioned upright in chair by PT upon SLP arrival and PT was attempting to use a communication board. Per PT, RN stated that pt was following some commands; however, pt was not observed to follow any simple commands. Pt made attempts to use his R hand, which is much weaker, to point at the communication board. Given Max verbal and tactile cues to use his L hand to point at the board or at specific images, pt made no attempts. When asked yes/no questions, pt inaccurately responded "yes" by nodding his head to all questions asked. He appeared to nod enthusiastically and make eye contact when given context, Mod verbal cues, and when the question represented a desired choice, such as "do you want to eat?" and "do you want a blanket?". He made little attempts at verbal communication today, although the attempts were fluent, but repetitive stereotypy responses. SLP will continue to f/u to assess cognition and facilitate functional communication.    HPI HPI: [name] is a 62 y.o. African American male with PMH of CAD, CHF s/p ICD, HLD, strokes, smoker presented to ED for code stroke. Progressed hemorrhage in the left frontal parietal region with 3 cm  hematoma and regional subarachnoid hemorrhage. Probable underlying  cortex infarct.  Underwent DIAGNOSTIC CEREBRAL ANGIOGRAM AND MECHANICAL THROMBECTOMY on 6/19 due to worsening of symptoms. F?u MRI also shows Small acute right cerebellar infarct. Pt has a history of unexplained gagging in 2016. Had MBS, SLP recommended chin tuck?      SLP Plan  Continue with current plan of care      Recommendations for follow up therapy are one component of a multi-disciplinary  discharge planning process, led by the attending physician.  Recommendations may be updated based on patient status, additional functional criteria and insurance authorization.    Recommendations                     Oral care BID   Frequent or constant Supervision/Assistance Dysarthria and anarthria (R47.1);Cognitive communication deficit (R41.841);Aphasia (R47.01)     Continue with current plan of care     [name], M.A., CF-SLP Speech Language Pathology, Acute Rehabilitation Services  Secure Chat preferred [phone]  10/04/2022, 1:04 PM

## 2022-10-04 NOTE — Progress Notes (Signed)
Initial Nutrition Assessment  DOCUMENTATION CODES:  Not applicable  INTERVENTION:  Continue Multivitamin w/ minerals daily Tube Feeds via Cortrak: Switch to Vital 1.5 at 30 mL/hr; advance by 10 mL every 8 hours to goal rate of 60 mL/hr (1440 mL per day) 60 mL ProSource TF20 - BID Tube feeds at goal provides 2320 kcal, 137 gm protein, and 110 mL free water daily.  Discontinue Vital AF 1.2   NUTRITION DIAGNOSIS:  Inadequate oral intake related to inability to eat as evidenced by NPO status.  GOAL:  Patient will meet greater than or equal to 90% of their needs  MONITOR:  Diet advancement, Labs, Weight trends, TF tolerance, I & O's  REASON FOR ASSESSMENT:  Consult Enteral/tube feeding initiation and management  ASSESSMENT:  62 y.o. male presented to the ED with L side weakness, slurry speech, talking slow, and feeling dizzy. PMH includes CAD, CHF, HLD, and HTN. Pt admitted with a stroke.  6/18 - Admitted; s/p TNK 6/19 - s/p thrombectomy of L M3; failed bedside swallow; Cortrak placed    Pt sitting up in chair. Did not wake to RD. Unable to obtain any prior nutrition history.   Discussed TF changes with RN.   Medications reviewed and include: MVI, Protonix, Sodium Chloride 3% Labs reviewed: Sodium 142, Potassium 4.1, Phosphorus 2.3, Magnesium 2.0   NUTRITION - FOCUSED PHYSICAL EXAM:  Flowsheet Row Most Recent Value  Orbital Region No depletion  Upper Arm Region No depletion  Thoracic and Lumbar Region No depletion  Buccal Region No depletion  Temple Region No depletion  Clavicle Bone Region No depletion  Clavicle and Acromion Bone Region No depletion  Scapular Bone Region No depletion  Dorsal Hand No depletion  Patellar Region Mild depletion  Anterior Thigh Region Mild depletion  Posterior Calf Region Mild depletion  Edema (RD Assessment) None  Hair Reviewed  Eyes Reviewed  Mouth Reviewed  Skin Reviewed  Nails Reviewed   Diet Order:   Diet Order              Diet NPO time specified  Diet effective now                  EDUCATION NEEDS: Not appropriate for education at this time  Skin:  Skin Assessment: Reviewed RN Assessment  Last BM:  Unknown  Height:  Ht Readings from Last 1 Encounters:  10/03/22 6\' 1"  (1.854 m)   Weight:  Wt Readings from Last 1 Encounters:  10/04/22 109.5 kg   Ideal Body Weight:  83.6 kg  BMI:  Body mass index is 31.85 kg/m.  Estimated Nutritional Needs:  Kcal:  2300-2500 Protein:  115-130 grams Fluid:  >/= 2 L   Kirby Crigler RD, LDN Clinical Dietitian See [facility] for contact information.

## 2022-10-04 NOTE — Progress Notes (Signed)
? ?  Inpatient Rehab Admissions Coordinator : ? ?Per therapy recommendations, patient was screened for CIR candidacy by Miyana Mordecai RN MSN.  At this time patient appears to be a potential candidate for CIR. I will place a rehab consult per protocol for full assessment. Please call me with any questions. ? ?Sulaiman Imbert RN MSN ?Admissions Coordinator ?[phone] ?  ?

## 2022-10-05 ENCOUNTER — Inpatient Hospital Stay (HOSPITAL_COMMUNITY): Payer: Medicare PPO

## 2022-10-05 DIAGNOSIS — I6300 Cerebral infarction due to thrombosis of unspecified precerebral artery: Secondary | ICD-10-CM | POA: Diagnosis not present

## 2022-10-05 LAB — BASIC METABOLIC PANEL
Anion gap: 9 (ref 5–15)
BUN: 13 mg/dL (ref 8–23)
CO2: 20 mmol/L — ABNORMAL LOW (ref 22–32)
Calcium: 8.1 mg/dL — ABNORMAL LOW (ref 8.9–10.3)
Chloride: 117 mmol/L — ABNORMAL HIGH (ref 98–111)
Creatinine, Ser: 0.92 mg/dL (ref 0.61–1.24)
GFR, Estimated: >60 mL/min (ref >60)
Glucose, Bld: 147 mg/dL — ABNORMAL HIGH (ref 70–99)
Potassium: 3.8 mmol/L (ref 3.5–5.1)
Sodium: 146 mmol/L — ABNORMAL HIGH (ref 135–145)

## 2022-10-05 LAB — CBC
HCT: 37.2 % — ABNORMAL LOW (ref 39.0–52.0)
Hemoglobin: 12.3 g/dL — ABNORMAL LOW (ref 13.0–17.0)
MCH: 29.7 pg (ref 26.0–34.0)
MCHC: 33.1 g/dL (ref 30.0–36.0)
MCV: 89.9 fL (ref 80.0–100.0)
Platelets: 89 10*3/uL — ABNORMAL LOW (ref 150–400)
RBC: 4.14 MIL/uL — ABNORMAL LOW (ref 4.22–5.81)
RDW: 13.4 % (ref 11.5–15.5)
WBC: 8.3 10*3/uL (ref 4.0–10.5)
nRBC: 0 % (ref 0.0–0.2)

## 2022-10-05 LAB — MAGNESIUM: Magnesium: 2.1 mg/dL (ref 1.7–2.4)

## 2022-10-05 LAB — GLUCOSE, CAPILLARY
Glucose-Capillary: 127 mg/dL — ABNORMAL HIGH (ref 70–99)
Glucose-Capillary: 132 mg/dL — ABNORMAL HIGH (ref 70–99)
Glucose-Capillary: 161 mg/dL — ABNORMAL HIGH (ref 70–99)
Glucose-Capillary: 147 mg/dL — ABNORMAL HIGH (ref 70–99)

## 2022-10-05 LAB — PROCALCITONIN: Procalcitonin: <0.1 ng/mL

## 2022-10-05 LAB — PHOSPHORUS: Phosphorus: 2.5 mg/dL (ref 2.5–4.6)

## 2022-10-05 LAB — SODIUM: Sodium: 145 mmol/L (ref 135–145)

## 2022-10-05 MED ORDER — POLYETHYLENE GLYCOL 3350 17 G PO PACK
17.0000 g | PACK | Freq: Two times a day (BID) | ORAL | Status: DC
  Administered 2022-10-05 – 2022-10-08 (×8): 17 g via ORAL
  Filled 2022-10-05 (×9): qty 1

## 2022-10-05 MED ORDER — POTASSIUM & SODIUM PHOSPHATES 280-160-250 MG PO PACK
2.0000 | PACK | Freq: Once | ORAL | Status: AC
  Administered 2022-10-05: 2
  Filled 2022-10-05: qty 2

## 2022-10-05 MED ORDER — PROSOURCE TF20 ENFIT COMPATIBL EN LIQD
60.0000 mL | Freq: Every day | ENTERAL | Status: DC
  Administered 2022-10-06 – 2022-10-08 (×3): 60 mL
  Filled 2022-10-05 (×3): qty 60

## 2022-10-05 MED ORDER — OSMOLITE 1.5 CAL PO LIQD
780.0000 mL | ORAL | Status: DC
  Administered 2022-10-05 – 2022-10-06 (×2): 780 mL
  Filled 2022-10-05: qty 948
  Filled 2022-10-05 (×5): qty 1000
  Filled 2022-10-05: qty 780
  Filled 2022-10-05: qty 1000

## 2022-10-05 MED ORDER — SENNOSIDES-DOCUSATE SODIUM 8.6-50 MG PO TABS
1.0000 | ORAL_TABLET | Freq: Two times a day (BID) | ORAL | Status: DC
  Administered 2022-10-05 – 2022-10-09 (×9): 1 via ORAL
  Filled 2022-10-05 (×9): qty 1

## 2022-10-05 NOTE — Progress Notes (Signed)
Physical Therapy Treatment Patient Details Name: [name] MRN: [id number] DOB: 02-10-1961 Today's Date: 10/05/2022   History of Present Illness Patient is a 62 y/o male admitted 10/03/22 with L weakness and numbness.  Received TNK then developed now symptoms of aphasia and R weakness.  He underwent mechanical thrombectomy L M3 segment MCA noted to have small hemorrhage and follow up CT showed progression of hemorrhage 3cm then 7cm and small acute R cerebellar infarct. PMH positive for CAD s/p PCI w/stent and 1v CABG, multiple CGAs, systolic HF s/p ICD, HLD, HTN.    PT Comments    Patient progressing this session able to stand more erect and not pushing to R until stepping forward toward chair.  Still with improved upright awareness in standing and performing several sit to stands with min A of 2.  Patient able to step with R foot forward, though not as coordinated.  Initially with automatic verbalization "Good Morning," then rest of verbalizations were garbled.  Seated in chair to brush teeth and wash face with OT.  Patient will continue to benefit from skilled PT in the acute setting.  Continue to recommend intensive inpatient rehab at d/c.  Recommendations for follow up therapy are one component of a multi-disciplinary discharge planning process, led by the attending physician.  Recommendations may be updated based on patient status, additional functional criteria and insurance authorization.  Follow Up Recommendations       Assistance Recommended at Discharge Frequent or constant Supervision/Assistance  Patient can return home with the following Two people to help with walking and/or transfers;A lot of help with bathing/dressing/bathroom;Assistance with cooking/housework;Direct supervision/assist for medications management;Assist for transportation;Help with stairs or ramp for entrance;Assistance with feeding   Equipment Recommendations  Other (comment) (TBA)    Recommendations for  Other Services       Precautions / Restrictions Precautions Precautions: Fall Precaution Comments: R inattention; SBP <160     Mobility  Bed Mobility Overal bed mobility: Needs Assistance       Supine to sit: HOB elevated, Min assist, Mod assist     General bed mobility comments: pt initiating to L side, with cues and assist moved to R side    Transfers Overall transfer level: Needs assistance   Transfers: Sit to/from Stand, Bed to chair/wheelchair/BSC Sit to Stand: Min assist, +2 physical assistance   Step pivot transfers: Mod assist, +2 safety/equipment       General transfer comment: up to stand with min A of 2 x 3 reps; to stand and step to recliner pt with increased R lateral lean when stepping with L and some difficulty moving R foot forward, standing from recliner pt able to lift R foot up  with cues for L lateral weight shift, with R weight shift overshoots and needs mod A to prevent LOB    Ambulation/Gait                   Stairs             Wheelchair Mobility    Modified Rankin (Stroke Patients Only) Modified Rankin (Stroke Patients Only) Pre-Morbid Rankin Score: Moderate disability Modified Rankin: Severe disability     Balance Overall balance assessment: Needs assistance   Sitting balance-Leahy Scale: Fair     Standing balance support: Bilateral upper extremity supported Standing balance-Leahy Scale: Poor Standing balance comment: static standing with erect posture and forward gaze  Cognition Arousal/Alertness: Awake/alert Behavior During Therapy: WFL for tasks assessed/performed, Impulsive Overall Cognitive Status: Difficult to assess Area of Impairment: Attention, Safety/judgement, Awareness, Following commands                   Current Attention Level: Sustained   Following Commands: Follows one step commands inconsistently, Follows one step commands with increased  time Safety/Judgement: Decreased awareness of safety, Decreased awareness of deficits     General Comments: R inattention, expressive, receptive aphasia        Exercises      General Comments General comments (skin integrity, edema, etc.): VSS, BP 141/74 after up in chair      Pertinent Vitals/Pain Pain Assessment Faces Pain Scale: No hurt    Home Living                          Prior Function            PT Goals (current goals can now be found in the care plan section) Progress towards PT goals: Progressing toward goals    Frequency    Min 4X/week      PT Plan Current plan remains appropriate    Co-evaluation PT/OT/SLP Co-Evaluation/Treatment: Yes Reason for Co-Treatment: Complexity of the patient's impairments (multi-system involvement);For patient/therapist safety PT goals addressed during session: Mobility/safety with mobility;Balance        AM-PAC PT "6 Clicks" Mobility   Outcome Measure  Help needed turning from your back to your side while in a flat bed without using bedrails?: A Lot Help needed moving from lying on your back to sitting on the side of a flat bed without using bedrails?: A Lot Help needed moving to and from a bed to a chair (including a wheelchair)?: Total Help needed standing up from a chair using your arms (e.g., wheelchair or bedside chair)?: A Lot Help needed to walk in hospital room?: Total Help needed climbing 3-5 steps with a railing? : Total 6 Click Score: 9    End of Session Equipment Utilized During Treatment: Gait belt;Oxygen Activity Tolerance: Patient tolerated treatment well Patient left: in chair;with chair alarm set;with call bell/phone within reach Nurse Communication: Mobility status PT Visit Diagnosis: Other abnormalities of gait and mobility (R26.89);Other symptoms and signs involving the nervous system (R29.898);Hemiplegia and hemiparesis Hemiplegia - Right/Left: Right Hemiplegia -  dominant/non-dominant: Dominant Hemiplegia - caused by: Cerebral infarction;Other Nontraumatic intracranial hemorrhage     Time: 1010-1038 PT Time Calculation (min) (ACUTE ONLY): 28 min  Charges:  $Therapeutic Activity: 8-22 mins                     Sheran Lawless, PT Acute Rehabilitation Services Office:[phone] 10/05/2022    [name] 10/05/2022, 4:58 PM

## 2022-10-05 NOTE — Progress Notes (Addendum)
STROKE TEAM PROGRESS NOTE   INTERVAL HISTORY His family is not at the bedside.    Patient with improved mental status this AM. He is able to bend his R fingers and able to move his R arm when supported. He spontaneously moves his RLE. Continues to have difficulty speaking with severe dysarthria but his able to follow commands. He remains on hypertonic saline and his blood pressures are at goal.    Had fever to 101.6 overnight. No leukocytosis. Difficult for patient to relay symptoms given his dysarthria. He is on RA, no cough. UA with no findings concerning for UTI. Cxr w/o opacity. Procalc negative.   Vitals:   10/05/22 0800 10/05/22 0900 10/05/22 1000 10/05/22 1200  BP: 121/82 (!) 147/70 (!) 141/83   Pulse: 68 76 69   Resp: 20 20 (!) 22   Temp: 99.6 F (37.6 C)   98.8 F (37.1 C)  TempSrc: Axillary   Oral  SpO2: 100% 95% 92%   Weight:      Height:       CBC:  Recent Labs  Lab 10/02/22 1228 10/02/22 1236 10/04/22 0525 10/05/22 0746  WBC 5.5  --  9.4 8.3  NEUTROABS 2.1  --   --   --   HGB 16.7   < > 13.7 12.3*  HCT 50.0   < > 41.7 37.2*  MCV 88.0  --  89.3 89.9  PLT 148*  --  110* 89*   < > = values in this interval not displayed.    Basic Metabolic Panel:  Recent Labs  Lab 10/04/22 0713 10/04/22 1355 10/04/22 1959 10/05/22 0221 10/05/22 0746  NA 142 144   < > 145 146*  K 4.1  --   --   --  3.8  CL 112*  --   --   --  117*  CO2 21*  --   --   --  20*  GLUCOSE 104*  --   --   --  147*  BUN 9  --   --   --  13  CREATININE 1.04  --   --   --  0.92  CALCIUM 8.2*  --   --   --  8.1*  MG 2.0 2.2  --   --  2.1  PHOS 2.3* 2.3*  --   --  2.5   < > = values in this interval not displayed.    Lipid Panel:  Recent Labs  Lab 10/03/22 0058  CHOL 124  TRIG 67  HDL 48  CHOLHDL 2.6  VLDL 13  LDLCALC 63    HgbA1c:  Recent Labs  Lab 10/03/22 0058  HGBA1C 5.8*    Urine Drug Screen:  Recent Labs  Lab 10/02/22 2140  LABOPIA NONE DETECTED  COCAINSCRNUR NONE  DETECTED  LABBENZ NONE DETECTED  AMPHETMU NONE DETECTED  THCU NONE DETECTED  LABBARB NONE DETECTED     Alcohol Level  Recent Labs  Lab 10/02/22 1228  ETH <10     IMAGING past 24 hours DG CHEST PORT 1 VIEW  Result Date: 10/05/2022 CLINICAL DATA:  Fevers EXAM: PORTABLE CHEST 1 VIEW COMPARISON:  05/04/2022 FINDINGS: Cardiac shadow is enlarged. Defibrillator is again seen. Feeding catheter is noted extending into the stomach. The lungs are well aerated with minimal right basilar atelectasis. No bony abnormality is noted. IMPRESSION: Minimal right basilar atelectasis. Electronically Signed   By: Alcide Clever M.D.   On: 10/05/2022 09:56      Physical  Exam  Constitutional: Appears well-developed and well-nourished.  Cardiovascular: Normal rate and regular rhythm.  Respiratory: Effort normal on RA GI: Soft.  No distension. There is no tenderness.  Skin: WDI  Neuro: Mental Status: Patient is awake, alert, speech is dysarthric. Unable to assess orientation  Follows simple commands Cranial Nerves: II: Pupils are equal, round, and reactive to light.   III,IV, VI: EOMI without ptosis or diplopia.  VII: Mild facial droop  VIII: Hearing is intact to voice X: Palate is midline and palate elevates symmetrically, phonation intact XII: tongue is midline without atrophy or fasciculations.  Motor: Tone is normal. Bulk is normal. Trace L sided weakness. RLE 3/5. RUE decreased strength 2/5    ASSESSMENT/PLAN [name] is a 62 y.o. male with history of CAD s/p PCI w/ stent to LCS and LAD, s/p 1v CABG, multiple CVAs (R cerebellar 04/2022),  syscolic HF s/p ICD, HLD, HTN presenting with slurred speech and L sided weakness now s/p TNK and IR thrombectomy of L MCA (M3 segment) c/b  hemorrhage.   Bilateral strokes s/p TNK initially for left hemiparesis and then several hours later developed new left hemispheric stroke with aphasia and right hemiparesis due to left M3 occlusion treated  with mechanical thrombectomy complicated by L frontoparietal parenchymal and subarachnoid hemorrhage w/o midline shift or herniation  Numerous chronic cerebral and cerebellar infarcts TIA Etiology:  Atherosclerosis versus cardioembolic    Post IR CT 1. Successful mechanical thrombectomy performed for treatment of a left M3/MCA occlusion achieving complete recanalization (TICI 3). Hyperdensity within the left sylvian fissure may represent small subarachnoid hemorrhage versus contrast. CT Head: Progressed hemorrhage in L frontoparietal region w/ 3cm hematoma and regional [facility] MRI  progressive L fronto parietal hematoma (~7cm), regional SAH, increased mass effect, no midline shift or herniation.  Likely underlying infarct. Sm R cerebellar infarct, acute. Numerous chornic cerebral and cerebellar infarcts CTA  short segment occlusion of distal L MCA M2 segment  2D Echo poor windows, unable to comment on EF. Focal RWMA. Known PFO Previously on 30 d monitor finished 05/2022-06/2022 no afib noted. No afib with ICD interrogation Will need ICD interrogated this hospitalization  Likely will need longer term monitor at discharge  Frequent neuro checks  Taper hypertonic saline given his improved neurological status.  LDL 63 HgbA1c 5.8 VTE prophylaxis - Continue SQH     Diet   DIET - DYS 1 Room service appropriate? No; Fluid consistency: Nectar Thick   aspirin 81 mg daily and clopidogrel 75 mg daily prior to admission, now on No antithrombotic due to hemorrhage.  Therapy recommendations:  Pending Disposition:  ICU, can transfer to floor once hypertonic saline waned.   Hypertension Home meds:  coreg 12.5mg  BID, entresto 97-103mg  BID, spironolactone 25mg  every day Stable, hold antihypertensives Goal SBP 100-140, bolus PRN, hold home antihypertensives  Long-term BP goal normotensive  Hyperlipidemia Home meds:  atorvastatin, resumed in hospital LDL 63, goal < 70 Continue statin at discharge  Diabetes  type II Controlled Home meds:  Farxiga  HgbA1c 5.8, goal < 7.0 CBGs Recent Labs    10/02/22 1354 10/05/22 0717 10/05/22 1130  GLUCAP 122* 127* 132*      Other Active Problems Systolic HF, s/p ICD likely 2/2 ICM. Last EF 35 to 40% Hold GDMT due to lower blood pressures Febrile again ON. No leukocytosis this AM. UA without evidence of UTI. CXR without opacity. Procalc negative. Possibly neurogenic. Will hold on antibiotics for now.   Hospital day # 3  Robert Haller, MD PGY-3 Internal Medicine Resident  Pager [phone]  I have personally obtained history,examined this patient, reviewed notes, independently viewed imaging studies, participated in medical decision making and plan of care.ROS completed by me personally and pertinent positives fully documented  I have made any additions or clarifications directly to the above note. Agree with note above.  Patient neurological exam shows slight improvement and will start tapering hypertonic saline discontinue within 24 hours.  Mobilize out of bed.  Physical occupational and speech therapy consults.  No family available at the bedside for discussion.This patient is critically ill and at significant risk of neurological worsening, death and care requires constant monitoring of vital signs, hemodynamics,respiratory and cardiac monitoring, extensive review of multiple databases, frequent neurological assessment, discussion with family, other specialists and medical decision making of high complexity.I have made any additions or clarifications directly to the above note.This critical care time does not reflect procedure time, or teaching time or supervisory time of PA/NP/Med Resident etc but could involve care discussion time.  I spent 30 minutes of neurocritical care time  in the care of  this patient.      Robert Heady, MD Medical Director [facility] Pager: [phone] 10/05/2022 2:36 PM   To contact Stroke Continuity  provider, please refer to [email or web address]. After hours, contact General Neurology

## 2022-10-05 NOTE — Progress Notes (Signed)
Temp 100.1 then 100.2 F PO. Dr. Ezzie Dural informed per order.

## 2022-10-05 NOTE — Progress Notes (Signed)
Nutrition Brief Note  Diet advanced to Dysphagia 1 (Pureed) with Nectar Thickened liquids.  Spoke with RN who reports that pt has done very well with his diet. He has ate 75% of his meals today.  Will adjust his TF to nocturnal administration and will adjust further as appropriate.   Osmolite 1.5 @ 65 ml/hr x 12 hours 60 ml ProSource TF20 Daily Provides: 1250 kcal, 68 grams protein, and 592 ml free water (54% of kcal needs, 61% of protein needs)  Shavonn Convey P., RD, LDN, CNSC See AMiON for contact information

## 2022-10-05 NOTE — Evaluation (Signed)
Occupational Therapy Evaluation Patient Details Name: [name] MRN: [id number] DOB: 07-11-1960 Today's Date: 10/05/2022   History of Present Illness Patient is a 62 y/o male admitted 10/03/22 with L weakness and numbness.  Received TNK then developed now symptoms of aphasia and R weakness.  He underwent mechanical thrombectomy L M3 segment MCA noted to have small hemorrhage and follow up CT showed progression of hemorrhage 3cm then 7cm and small acute R cerebellar infarct. PMH positive for CAD s/p PCI w/stent and 1v CABG, multiple CGAs, systolic HF s/p ICD, HLD, HTN.   Clinical Impression   Patient admitted for the diagnosis above.  PTA he lived alone in an apartment, and had assist from his brother and a PCA for community mobility and iADL.  Currently he is needing up to +2 for basic transfers, and Max A for lower body ADL from a sit to stand level.  OT is indicated in the acute setting to address deficits, and Patient will benefit from intensive inpatient follow up therapy, >3 hours/day       Recommendations for follow up therapy are one component of a multi-disciplinary discharge planning process, led by the attending physician.  Recommendations may be updated based on patient status, additional functional criteria and insurance authorization.   Assistance Recommended at Discharge Frequent or constant Supervision/Assistance  Patient can return home with the following Help with stairs or ramp for entrance;Direct supervision/assist for medications management;Two people to help with walking and/or transfers;A lot of help with bathing/dressing/bathroom;Assist for transportation;Direct supervision/assist for financial management;Assistance with cooking/housework    Functional Status Assessment  Patient has had a recent decline in their functional status and demonstrates the ability to make significant improvements in function in a reasonable and predictable amount of time.  Equipment  Recommendations  Other (comment)    Recommendations for Other Services       Precautions / Restrictions Precautions Precautions: Fall Precaution Comments: R inattention; SBP <160 Restrictions Weight Bearing Restrictions: No      Mobility Bed Mobility Overal bed mobility: Needs Assistance       Supine to sit: HOB elevated, Min assist, Mod assist          Transfers Overall transfer level: Needs assistance   Transfers: Sit to/from Stand, Bed to chair/wheelchair/BSC Sit to Stand: Min assist, +2 physical assistance Stand pivot transfers: +2 physical assistance, Mod assist                Balance Overall balance assessment: Needs assistance Sitting-balance support: Feet supported Sitting balance-Leahy Scale: Fair     Standing balance support: Bilateral upper extremity supported Standing balance-Leahy Scale: Poor                             ADL either performed or assessed with clinical judgement   ADL Overall ADL's : Needs assistance/impaired Eating/Feeding: Minimal assistance;Sitting   Grooming: Wash/dry hands;Wash/dry face;Moderate assistance;Sitting   Upper Body Bathing: Moderate assistance;Sitting   Lower Body Bathing: Maximal assistance;Sit to/from stand;+2 for safety/equipment   Upper Body Dressing : Moderate assistance;Sitting   Lower Body Dressing: Maximal assistance;Sit to/from stand;+2 for safety/equipment   Toilet Transfer: Moderate assistance;+2 for physical assistance;+2 for safety/equipment;BSC/3in1;Stand-pivot   Toileting- Clothing Manipulation and Hygiene: Maximal assistance;+2 for safety/equipment               Vision Baseline Vision/History: 1 Wears glasses Patient Visual Report: Peripheral vision impairment Vision Assessment?: Vision impaired- to be further tested in functional context  Additional Comments: R neglect versus fiel cut.  Did not blink to threat, can cross midline with cues     Perception     Praxis       Pertinent Vitals/Pain Pain Assessment Pain Assessment: Faces Faces Pain Scale: No hurt Pain Intervention(s): Monitored during session     Hand Dominance Right   Extremity/Trunk Assessment Upper Extremity Assessment Upper Extremity Assessment: RUE deficits/detail RUE Deficits / Details: AROM noted with yawn, but no AROM noted when cued. RUE Sensation: decreased light touch RUE Coordination: decreased fine motor;decreased gross motor LUE Deficits / Details: WFL   Lower Extremity Assessment Lower Extremity Assessment: Defer to PT evaluation       Communication Communication Communication: Receptive difficulties;Expressive difficulties   Cognition Arousal/Alertness: Awake/alert Behavior During Therapy: WFL for tasks assessed/performed, Impulsive Overall Cognitive Status: Difficult to assess                           Safety/Judgement: Decreased awareness of safety, Decreased awareness of deficits     General Comments: R inattention, expressive, receptive aphasia                      Home Living Family/patient expects to be discharged to:: Private residence Living Arrangements: Alone Available Help at Discharge: Personal care attendant Type of Home: Apartment Home Access: Elevator     Home Layout: Two level;Other (Comment)   Alternate Level Stairs-Rails: Left Bathroom Shower/Tub: Tub/shower unit   Bathroom Toilet: Handicapped height Bathroom Accessibility: No   Home Equipment: Agricultural consultant (2 wheels);Cane - single point          Prior Functioning/Environment Prior Level of Function : Needs assist             Mobility Comments: using a cane at baseline ADLs Comments: Aide assists with IADL's, patient does cook a little on his own.        OT Problem List: Decreased strength;Decreased activity tolerance;Impaired balance (sitting and/or standing);Decreased safety awareness;Decreased coordination;Impaired vision/perception;Impaired  UE functional use;Increased edema      OT Treatment/Interventions: Self-care/ADL training;Therapeutic exercise;Balance training;Therapeutic activities;Visual/perceptual remediation/compensation;DME and/or AE instruction;Patient/family education    OT Goals(Current goals can be found in the care plan section) Acute Rehab OT Goals OT Goal Formulation: Patient unable to participate in goal setting Time For Goal Achievement: 10/19/22 Potential to Achieve Goals: Fair ADL Goals Pt Will Perform Grooming: with set-up;sitting Pt Will Perform Upper Body Bathing: with min assist;sitting Pt Will Perform Lower Body Bathing: with mod assist;sit to/from stand Pt Will Perform Upper Body Dressing: with min assist;sitting Pt Will Perform Lower Body Dressing: with mod assist;sit to/from stand Pt Will Transfer to Toilet: stand pivot transfer;bedside commode;with mod assist  OT Frequency: Min 2X/week    Co-evaluation PT/OT/SLP Co-Evaluation/Treatment: Yes Reason for Co-Treatment: Complexity of the patient's impairments (multi-system involvement);For patient/therapist safety   OT goals addressed during session: ADL's and self-care      AM-PAC OT "6 Clicks" Daily Activity     Outcome Measure Help from another person eating meals?: A Little Help from another person taking care of personal grooming?: A Lot Help from another person toileting, which includes using toliet, bedpan, or urinal?: A Lot Help from another person bathing (including washing, rinsing, drying)?: A Lot Help from another person to put on and taking off regular upper body clothing?: A Lot Help from another person to put on and taking off regular lower body clothing?: A Lot 6 Click Score: 13  End of Session Equipment Utilized During Treatment: Gait belt Nurse Communication: Mobility status  Activity Tolerance: Patient tolerated treatment well Patient left: in chair;with call bell/phone within reach;with chair alarm set  OT Visit  Diagnosis: Unsteadiness on feet (R26.81);Other abnormalities of gait and mobility (R26.89);Muscle weakness (generalized) (M62.81);Cognitive communication deficit (R41.841);Hemiplegia and hemiparesis Hemiplegia - Right/Left: Right Hemiplegia - dominant/non-dominant: Dominant                Time: 1010-1038 OT Time Calculation (min): 28 min Charges:  OT General Charges $OT Visit: 1 Visit OT Evaluation $OT Eval Moderate Complexity: 1 Mod  10/05/2022  RP, OTR/L  Acute Rehabilitation Services  Office:  [phone]   Suzanna Obey 10/05/2022, 11:04 AM

## 2022-10-06 ENCOUNTER — Inpatient Hospital Stay (HOSPITAL_COMMUNITY): Payer: Medicare PPO

## 2022-10-06 DIAGNOSIS — I6389 Other cerebral infarction: Secondary | ICD-10-CM | POA: Diagnosis not present

## 2022-10-06 DIAGNOSIS — I63413 Cerebral infarction due to embolism of bilateral middle cerebral arteries: Secondary | ICD-10-CM | POA: Diagnosis not present

## 2022-10-06 DIAGNOSIS — I5021 Acute systolic (congestive) heart failure: Secondary | ICD-10-CM

## 2022-10-06 LAB — TROPONIN I (HIGH SENSITIVITY)
Troponin I (High Sensitivity): 45 ng/L — ABNORMAL HIGH (ref <18)
Troponin I (High Sensitivity): 43 ng/L — ABNORMAL HIGH (ref <18)
Troponin I (High Sensitivity): 39 ng/L — ABNORMAL HIGH (ref <18)
Troponin I (High Sensitivity): 35 ng/L — ABNORMAL HIGH (ref <18)

## 2022-10-06 LAB — BASIC METABOLIC PANEL
Anion gap: 9 (ref 5–15)
BUN: 13 mg/dL (ref 8–23)
CO2: 22 mmol/L (ref 22–32)
Calcium: 8.9 mg/dL (ref 8.9–10.3)
Chloride: 117 mmol/L — ABNORMAL HIGH (ref 98–111)
Creatinine, Ser: 0.96 mg/dL (ref 0.61–1.24)
GFR, Estimated: >60 mL/min (ref >60)
Glucose, Bld: 145 mg/dL — ABNORMAL HIGH (ref 70–99)
Potassium: 3.9 mmol/L (ref 3.5–5.1)
Sodium: 148 mmol/L — ABNORMAL HIGH (ref 135–145)

## 2022-10-06 LAB — MAGNESIUM: Magnesium: 2.1 mg/dL (ref 1.7–2.4)

## 2022-10-06 LAB — GLUCOSE, CAPILLARY
Glucose-Capillary: 115 mg/dL — ABNORMAL HIGH (ref 70–99)
Glucose-Capillary: 115 mg/dL — ABNORMAL HIGH (ref 70–99)
Glucose-Capillary: 122 mg/dL — ABNORMAL HIGH (ref 70–99)
Glucose-Capillary: 141 mg/dL — ABNORMAL HIGH (ref 70–99)
Glucose-Capillary: 152 mg/dL — ABNORMAL HIGH (ref 70–99)

## 2022-10-06 LAB — CK TOTAL AND CKMB (NOT AT ARMC)
CK, MB: 3.5 ng/mL (ref 0.5–5.0)
Total CK: 3714 U/L — ABNORMAL HIGH (ref 49–397)

## 2022-10-06 NOTE — Progress Notes (Signed)
Inpatient Rehab Admissions Coordinator:    I met with Pt. To discuss potential CIR admit. Son and daughter present. They are interested, going to discuss whether or not it's feasible for family to rotate to provide 24/7 support. I will open case with insurance.  Megan Salon, MS, CCC-SLP Rehab Admissions Coordinator  [phone] (celll) [phone] (office)

## 2022-10-06 NOTE — Progress Notes (Signed)
During breakfast, RN set up patient according to ST's recommendations.  RN began to assist patient with feeding. Patient took one bite of pancake and immediately started coughing and choking. He coughed until he vomited. RN suctioned patients mouth and throat and leaned patient over as far as possible. Since patient had prior neuro changes and potential changes on his CT scan. The decision was made to make the patient NPO again and ask ST to re-eval the patients swallow.  Patient okay with this plan. MD aware and agreeable to this plan as well.

## 2022-10-06 NOTE — Progress Notes (Addendum)
STROKE TEAM PROGRESS NOTE   INTERVAL HISTORY Family at bedside. Overnight patient had 11 beats of V. tach.  EKG showed possible lateral ischemia versus inferior infarct.  Troponins and CK sent this morning, elevated.  Will continue to trend troponin.  Patient does have ICD in place secondary to ischemic cardiomyopathy.  Overnight CT shows redistribution of IPH.   Low-grade fevers today, improved since yesterday. Patient continues to have severe dysarthria, right facial droop left-sided weakness.   Vitals:   10/06/22 1106 10/06/22 1200 10/06/22 1300 10/06/22 1400  BP:  (!) 123/90 133/83 (!) 127/91  Pulse:  (!) 49 (!) 50 (!) 55  Resp:  17 (!) 23 (!) 28  Temp: 99.6 F (37.6 C)     TempSrc: Axillary     SpO2:  99% 95% 93%  Weight:      Height:       CBC:  Recent Labs  Lab 10/02/22 1228 10/02/22 1236 10/04/22 0525 10/05/22 0746  WBC 5.5  --  9.4 8.3  NEUTROABS 2.1  --   --   --   HGB 16.7   < > 13.7 12.3*  HCT 50.0   < > 41.7 37.2*  MCV 88.0  --  89.3 89.9  PLT 148*  --  110* 89*   < > = values in this interval not displayed.    Basic Metabolic Panel:  Recent Labs  Lab 10/04/22 1355 10/04/22 1959 10/05/22 0746 10/06/22 0231  NA 144   < > 146* 148*  K  --   --  3.8 3.9  CL  --   --  117* 117*  CO2  --   --  20* 22  GLUCOSE  --   --  147* 145*  BUN  --   --  13 13  CREATININE  --   --  0.92 0.96  CALCIUM  --   --  8.1* 8.9  MG 2.2  --  2.1 2.1  PHOS 2.3*  --  2.5  --    < > = values in this interval not displayed.    Lipid Panel:  Recent Labs  Lab 10/03/22 0058  CHOL 124  TRIG 67  HDL 48  CHOLHDL 2.6  VLDL 13  LDLCALC 63    HgbA1c:  Recent Labs  Lab 10/03/22 0058  HGBA1C 5.8*    Urine Drug Screen:  Recent Labs  Lab 10/02/22 2140  LABOPIA NONE DETECTED  COCAINSCRNUR NONE DETECTED  LABBENZ NONE DETECTED  AMPHETMU NONE DETECTED  THCU NONE DETECTED  LABBARB NONE DETECTED     Alcohol Level  Recent Labs  Lab 10/02/22 1228  ETH <10      IMAGING past 24 hours CT HEAD WO CONTRAST ( )  Result Date: 10/06/2022 CLINICAL DATA:  Hemorrhage follow-up EXAM: CT HEAD WITHOUT CONTRAST TECHNIQUE: Contiguous axial images were obtained from the base of the skull through the vertex without intravenous contrast. RADIATION DOSE REDUCTION: This exam was performed according to the departmental dose-optimization program which includes automated exposure control, adjustment of the mA and/or kV according to patient size and/or use of iterative reconstruction technique. COMPARISON:  10/03/2022 FINDINGS: Brain: Unchanged size of large intraparenchymal hematoma within the posterior left hemisphere. There is now a component of intraparenchymal hemorrhage in the posterior right hemisphere that measures approximately 2.5 x 1.4 cm. There is increased subarachnoid blood over the posterior right hemisphere and the cerebellum. No hydrocephalus. Trace rightward midline shift. Intraventricular blood is less clearly visualized on this study. Vascular:  No hyperdense vessel or unexpected calcification. Skull: Normal. Negative for fracture or focal lesion. Sinuses/Orbits: No acute finding. Other: None. IMPRESSION: 1. Unchanged size of large intraparenchymal hematoma within the posterior left hemisphere. 2. New component of intraparenchymal hemorrhage in the posterior right hemisphere that measures approximately 2.5 x 1.4 cm. 3. Increased subarachnoid blood over the posterior right hemisphere and the cerebellum. Electronically Signed   By: [name] M.D.   On: 10/06/2022 00:41      Physical Exam  Constitutional: Appears well-developed and well-nourished.  Cardiovascular: Normal rate and regular rhythm.  Respiratory: Effort normal on RA GI: Soft.  No distension. There is no tenderness.  Skin: WDI  Neuro: Mental Status: Patient is awake, alert, speech is dysarthric. Unable to assess orientation  Follows simple commands Cranial Nerves: II: Pupils are equal,  round, and reactive to light.   III,IV, VI: EOMI without ptosis or diplopia.  VII: Mild facial droop  VIII: Hearing is intact to voice X: Palate is midline and palate elevates symmetrically, phonation intact XII: tongue is midline without atrophy or fasciculations.  Motor: Tone is normal. Bulk is normal. Trace L sided weakness. RLE 3/5. RUE decreased strength 2/5   ASSESSMENT/Tucker [name] is a 62 y.o. male with history of CAD s/p PCI w/ stent to LCS and LAD, s/p 1v CABG, multiple CVAs (R cerebellar 04/2022),  syscolic HF s/p ICD, HLD, HTN presenting with slurred speech and L sided weakness now s/p TNK and IR thrombectomy of L MCA (M3 segment) c/b  hemorrhage.   TIA - Right brain s/p TNK Stroke - Left MCA and right cerebellar infarcts left M3 occlusion s/p IR with TICIs, complicated by L frontoparietal ICH and SAH, etiology likely cardioembolic source  CT 6/18 no acute finding, but the bilateral frontal, parietal, occipital and cerebellum infarcts CTA head and neck unremarkable CTA head and neck repeat new short segment occlusion of distal left M2 S/p IR with left M3 TICIs reperfusion  post IR CT Hyperdensity within the left sylvian fissure may represent small subarachnoid hemorrhage versus contrast. CT Head: Progressed hemorrhage in L frontoparietal region w/ 3cm hematoma and regional SAH MRI  progressive L fronto parietal hematoma (~7cm), regional SAH, increased mass effect.  Likely underlying infarct. Sm R cerebellar infarct, acute. Numerous chornic cerebral and cerebellar infarcts Repeat CT 6/19 increase in size of the left frontal/parietal hematoma. New extension into the left lateral ventricle. CT repeat 6/22 x 2 - New component of intraparenchymal hemorrhage in the posterior right hemisphere that measures approximately 2.5 x 1.4 cm. (On my review, more like right brain redistribution instead of new hemorrhage)  Echo pending No afib with ICD interrogation LE venous doppler  neg for DVT TCD bubble study showed moderate sized PFO LDL 63 HgbA1c 5.8 UDS neg VTE prophylaxis - SQH  aspirin 81 mg daily and clopidogrel 75 mg daily prior to admission, now on No antithrombotic due to hemorrhage.  Therapy recommendations:  CIR Disposition:  pending  CHF s/p ICD Hypertension Systolic HF, s/p ICD 2/2 ICM. Last EF 35 to 40% Echo pending Trop 45 -> 43 CK 3714, CKMB 3.5 Home meds:  coreg 12.5mg  BID, entresto 97-103mg  BID, spironolactone 25mg  Stable, hold antihypertensives Goal SBP < 160, bolus PRN, hold home antihypertensives  Long-term BP goal normotensive  Hyperlipidemia Home meds:  atorvastatin LDL 63, goal < 70 Off statin now, given no po access Continue statin at discharge  Tobacco abuse Current smoker Smoking cessation counseling will be provided Nicotine patch provided  Fever Tmax 101.6 -> No leukocytosis UA no UTI.  CXR without opacity.  Procalc negative.  Possibly neurogenic. Will hold on antibiotics for now.   Dysphagia  Vomiting on zofran PRN Po meds on hold TF on hold Continue IVF  PFO TCD bubble study showed a moderate-sized PFO LE venous Doppler negative for DVT  not emergent issue at this time Outpatient follow-up  Other stroke risk factors Obesity with BMI 32.23 CAD  Other Active Problems Thrombocytopenia, platelet 110->89  Hospital day # 4   Pt seen by Neuro NP/APP and later by MD. Note/Tucker to be edited by MD as needed.    Robert January, DNP, AGACNP-BC [facility] Please use AMION for contact information & EPIC for messaging.  ATTENDING NOTE: I reviewed above note and agree with the assessment and Tucker. Pt was seen and examined.   awake, alert, eyes open, severe dysarthria, mostly intangible.  However able to repeat "I am" in significant dysarthric voice, not able repeat further sentence, not able to name.  Able to follow some midline commands but not peripheral commands, however able to mimic some but  not all of them. No gaze palsy, tracking bilaterally, not blinking to visual threat bilaterally, however seem to have right hemianopia by observation, PERRL.  Right facial droop. Tongue midline inside mouth.  Right upper extremity proximal 2/5, distal 0/5.  Right lower extremity proximal 3/5, distal 1/5.  Left upper and lower extremity at least 4/5. Sensation and coordination not corporative, gait not tested.   Pt bilateral new and chronic strokes, consistent with cardioembolic source, likely due to CHF.  ICD interrogation showed no A-fib.  Not AC candidate for now due to hemorrhagic transformation.  CT repeat today showed right hemisphere hypodensity, on my review, concerning for redistribution of left hemorrhagic transformation.  Neuroexam reassurance.  Pending 2D echo for EF evaluation.  Vomited twice this morning, tube feeding and p.o. meds on hold.  Fever resolved leukocytosis, source unclear, continue monitoring.  Continue supportive care.  For detailed assessment and Tucker, please refer to above/below as I have made changes wherever appropriate.   Robert Plan, MD PhD Stroke Neurology 10/06/2022 10:25 PM  This patient is critically ill due to bilateral stroke, status post TNK, significant send hemorrhagic transformation, CHF status post ICD, fever and at significant risk of neurological worsening, death form recurrent stroke, hemorrhagic transformation with cerebral edema, brain herniation, heart failure, sepsis, aspiration. This patient's care requires constant monitoring of vital signs, hemodynamics, respiratory and cardiac monitoring, review of multiple databases, neurological assessment, discussion with family, other specialists and medical decision making of high complexity. I spent 50 minutes of neurocritical care time in the care of this patient.    To contact Stroke Continuity provider, please refer to [email or web address]. After hours, contact General Neurology

## 2022-10-06 NOTE — Progress Notes (Signed)
At 2345, pt was noted to have new documented RUE (absent of movement). Dr. Ezzie Dural informed. V.O., "ok to take pt to [facility]". CTH obtained, (see results review). Pt also noted around 0120 to have 12 beats Vtach, asymptomatic. DR. Sal informed as well and EGK obtained. (See results review).

## 2022-10-07 ENCOUNTER — Inpatient Hospital Stay (HOSPITAL_COMMUNITY): Payer: Medicare PPO

## 2022-10-07 DIAGNOSIS — I5022 Chronic systolic (congestive) heart failure: Secondary | ICD-10-CM | POA: Diagnosis not present

## 2022-10-07 DIAGNOSIS — I253 Aneurysm of heart: Secondary | ICD-10-CM | POA: Diagnosis not present

## 2022-10-07 DIAGNOSIS — I6389 Other cerebral infarction: Secondary | ICD-10-CM

## 2022-10-07 DIAGNOSIS — I25810 Atherosclerosis of coronary artery bypass graft(s) without angina pectoris: Secondary | ICD-10-CM | POA: Diagnosis not present

## 2022-10-07 DIAGNOSIS — I63413 Cerebral infarction due to embolism of bilateral middle cerebral arteries: Secondary | ICD-10-CM | POA: Diagnosis not present

## 2022-10-07 DIAGNOSIS — I63412 Cerebral infarction due to embolism of left middle cerebral artery: Secondary | ICD-10-CM | POA: Diagnosis not present

## 2022-10-07 LAB — ECHOCARDIOGRAM LIMITED
Calc EF: 32.3 %
Height: 73 in
S' Lateral: 4.8 cm
Single Plane A2C EF: 31 %
Single Plane A4C EF: 33.4 %
Weight: 3880.1 oz

## 2022-10-07 LAB — GLUCOSE, CAPILLARY
Glucose-Capillary: 156 mg/dL — ABNORMAL HIGH (ref 70–99)
Glucose-Capillary: 179 mg/dL — ABNORMAL HIGH (ref 70–99)
Glucose-Capillary: 158 mg/dL — ABNORMAL HIGH (ref 70–99)
Glucose-Capillary: 113 mg/dL — ABNORMAL HIGH (ref 70–99)
Glucose-Capillary: 139 mg/dL — ABNORMAL HIGH (ref 70–99)

## 2022-10-07 LAB — CBC
HCT: 38.6 % — ABNORMAL LOW (ref 39.0–52.0)
Hemoglobin: 12.9 g/dL — ABNORMAL LOW (ref 13.0–17.0)
MCH: 30.3 pg (ref 26.0–34.0)
MCHC: 33.4 g/dL (ref 30.0–36.0)
MCV: 90.6 fL (ref 80.0–100.0)
Platelets: 119 10*3/uL — ABNORMAL LOW (ref 150–400)
RBC: 4.26 MIL/uL (ref 4.22–5.81)
RDW: 13.3 % (ref 11.5–15.5)
WBC: 7.6 10*3/uL (ref 4.0–10.5)
nRBC: 0 % (ref 0.0–0.2)

## 2022-10-07 LAB — BASIC METABOLIC PANEL
Anion gap: 11 (ref 5–15)
BUN: 16 mg/dL (ref 8–23)
CO2: 22 mmol/L (ref 22–32)
Calcium: 8.9 mg/dL (ref 8.9–10.3)
Chloride: 111 mmol/L (ref 98–111)
Creatinine, Ser: 1.1 mg/dL (ref 0.61–1.24)
GFR, Estimated: >60 mL/min (ref >60)
Glucose, Bld: 179 mg/dL — ABNORMAL HIGH (ref 70–99)
Potassium: 4.2 mmol/L (ref 3.5–5.1)
Sodium: 144 mmol/L (ref 135–145)

## 2022-10-07 MED ORDER — ATORVASTATIN CALCIUM 80 MG PO TABS
80.0000 mg | ORAL_TABLET | Freq: Every day | ORAL | Status: DC
  Administered 2022-10-07 – 2022-10-09 (×3): 80 mg
  Filled 2022-10-07 (×3): qty 1

## 2022-10-07 NOTE — Progress Notes (Signed)
Echocardiogram 2D Echocardiogram has been performed.  Warren Lacy Danayah Smyre RDCS 10/07/2022, 10:53 AM

## 2022-10-07 NOTE — Progress Notes (Addendum)
STROKE TEAM PROGRESS NOTE   INTERVAL HISTORY  TTE today shows EF 20-25% with large inferior pseudoaneurysm, probable source of embolic infarcts.  Cardio consulted, suspects dehiscence of previous pseudoaneurysm patch with new pseudoaneurysm, would want anticoagulation and repeat pseudoaneurysm repair once healthy enough.  Unfortunately, patient is not candidate for needed anticoagulation for this at this time due to hemorrhages, would need to wait at least a week after hemorrhage occurred (6/26 or later).  Overnight CT shows unchanged appearance of IPH and subarachnoid hemorrhage. Patient continues to have severe dysarthria, right facial droop left-sided weakness.   Vitals:   10/07/22 0400 10/07/22 0500 10/07/22 0600 10/07/22 0700  BP: 138/84 136/84 126/72 122/79  Pulse: 75 81 78 87  Resp: (!) 28 (!) 25 (!) 27 (!) 21  Temp: 98 F (36.7 C)     TempSrc: Oral     SpO2: 98% 99% 97% 98%  Weight:      Height:       CBC:  Recent Labs  Lab 10/02/22 1228 10/02/22 1236 10/04/22 0525 10/05/22 0746  WBC 5.5  --  9.4 8.3  NEUTROABS 2.1  --   --   --   HGB 16.7   < > 13.7 12.3*  HCT 50.0   < > 41.7 37.2*  MCV 88.0  --  89.3 89.9  PLT 148*  --  110* 89*   < > = values in this interval not displayed.    Basic Metabolic Panel:  Recent Labs  Lab 10/04/22 1355 10/04/22 1959 10/05/22 0746 10/06/22 0231  NA 144   < > 146* 148*  K  --   --  3.8 3.9  CL  --   --  117* 117*  CO2  --   --  20* 22  GLUCOSE  --   --  147* 145*  BUN  --   --  13 13  CREATININE  --   --  0.92 0.96  CALCIUM  --   --  8.1* 8.9  MG 2.2  --  2.1 2.1  PHOS 2.3*  --  2.5  --    < > = values in this interval not displayed.    Lipid Panel:  Recent Labs  Lab 10/03/22 0058  CHOL 124  TRIG 67  HDL 48  CHOLHDL 2.6  VLDL 13  LDLCALC 63    HgbA1c:  Recent Labs  Lab 10/03/22 0058  HGBA1C 5.8*    Urine Drug Screen:  Recent Labs  Lab 10/02/22 2140  LABOPIA NONE DETECTED  COCAINSCRNUR NONE  DETECTED  LABBENZ NONE DETECTED  AMPHETMU NONE DETECTED  THCU NONE DETECTED  LABBARB NONE DETECTED     Alcohol Level  Recent Labs  Lab 10/02/22 1228  ETH <10     IMAGING past 24 hours CT HEAD WO CONTRAST ( )  Result Date: 10/06/2022 CLINICAL DATA:  Hemorrhage follow-up EXAM: CT HEAD WITHOUT CONTRAST TECHNIQUE: Contiguous axial images were obtained from the base of the skull through the vertex without intravenous contrast. RADIATION DOSE REDUCTION: This exam was performed according to the departmental dose-optimization program which includes automated exposure control, adjustment of the mA and/or kV according to patient size and/or use of iterative reconstruction technique. COMPARISON:  10/06/2022 at 12 06 a.m. FINDINGS: Brain: Unchanged appearance of bilateral mixed intraparenchymal and subarachnoid hemorrhage. Trace rightward midline shift is unchanged. No hydrocephalus. No new site of hemorrhage. Vascular: No hyperdense vessel or unexpected calcification. Skull: Normal. Negative for fracture or focal lesion. Sinuses/Orbits: No acute  finding. Other: None. IMPRESSION: Unchanged appearance of bilateral mixed intraparenchymal and subarachnoid hemorrhage. Trace rightward midline shift is unchanged. No hydrocephalus. Electronically Signed   By: [name] M.D.   On: 10/06/2022 20:30     Physical Exam  Constitutional: Appears well-developed and well-nourished.  Cardiovascular: Normal rate and regular rhythm.  Respiratory: Effort normal on RA GI: Soft.  No distension. There is no tenderness.  Skin: WDI  Neuro: Mental Status: Patient is awake, alert, speech is dysarthric. Unable to assess orientation  Follows simple commands Cranial Nerves: II: Pupils are equal, round, and reactive to light.   III,IV, VI: EOMI without ptosis or diplopia.  VII: Mild facial droop  VIII: Hearing is intact to voice X: Palate is midline and palate elevates symmetrically, phonation intact XII: tongue is  midline without atrophy or fasciculations.  Motor: Tone is normal. Bulk is normal. Trace L sided weakness. RLE 3/5. RUE decreased strength 2/5   ASSESSMENT/PLAN [name] is a 62 y.o. male with history of CAD s/p PCI w/ stent to LCS and LAD, s/p 1v CABG, multiple CVAs (R cerebellar 04/2022),  syscolic HF s/p ICD, HLD, HTN presenting with slurred speech and L sided weakness now s/p TNK and IR thrombectomy of L MCA (M3 segment) c/b  hemorrhage.   TIA - Right brain stroke aborted s/p TNK Stroke - Left MCA and right cerebellar infarcts left M3 occlusion s/p IR with TICIs, complicated by L frontoparietal ICH and SAH, etiology likely cardioembolic source  CT 6/18 no acute finding, but the bilateral frontal, parietal, occipital and cerebellum infarcts CTA head and neck unremarkable CTA head and neck repeat new short segment occlusion of distal left M2 S/p IR with left M3 TICIs reperfusion  post IR CT Hyperdensity within the left sylvian fissure may represent small subarachnoid hemorrhage versus contrast. CT Head: Progressed hemorrhage in L frontoparietal region w/ 3cm hematoma and regional SAH MRI  progressive L fronto parietal hematoma (~7cm), regional SAH, increased mass effect.  Likely underlying infarct. Sm R cerebellar infarct, acute. Numerous chornic cerebral and cerebellar infarcts Repeat CT 6/19 increase in size of the left frontal/parietal hematoma. New extension into the left lateral ventricle. CT repeat 6/22 x 2 - New component of intraparenchymal hemorrhage in the posterior right hemisphere that measures approximately 2.5 x 1.4 cm. (On my review, more like right brain redistribution instead of new hemorrhage)  Repeat CT 6/23: Unchanged appearance of bilateral mixed intraparenchymal and subarachnoid hemorrhage. Trace rightward midline shift is unchanged. No hydrocephalus Echo: Large inferior pseudoaneurysm with a depth of 3 cm and a maximum width of roughly 5 cm the wall of the  pseudoaneurysm is made above the layer of thrombus that is roughly 1.5 cm thick. EF 30 to 35%, severe hypokinesis of left ventricle. No afib with ICD interrogation LE venous doppler neg for DVT TCD bubble study showed moderate sized PFO LDL 63 HgbA1c 5.8 UDS neg VTE prophylaxis - SQH  aspirin 81 mg daily and clopidogrel 75 mg daily prior to admission, now on No antithrombotic due to hemorrhage. May need to consider [facility] in a week given significant embolic risk.  Therapy recommendations:  CIR Disposition:  ICU  CHF s/p ICD Hypertension Systolic HF, s/p ICD 2/2 ICM.  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  Cardiology consulted Plan for CT angio of the heart Patient not a candidate for anticoagulation due to hemorrhages at this time.  Possibly able to start anticoagulation in 1 week. Ultimately patient will need to repeat undergo repeat pseudoaneurysm repair  Hyperlipidemia Home meds:  atorvastatin 80 LDL 63, goal < 70 On lipitor 80 Continue statin at discharge  Tobacco abuse Current smoker Smoking cessation counseling will be provided Nicotine patch provided  Fever Tmax 101.6 -> 99.8-> afebrile No leukocytosis UA no UTI.  CXR without opacity.  Procalc negative.  Possibly neurogenic. Will hold on antibiotics for now.   Dysphagia  Vomiting on zofran PRN, improved TF resumed Resume PO meds  PFO Known PFO in the past TCD bubble study showed a moderate-sized PFO LE venous Doppler negative  for DVT  Not an emergent issue at this time  Other stroke risk factors Obesity with BMI 32.23 CAD  Other Active Problems Thrombocytopenia, platelet 110->89  Hospital day # 5   Pt seen by Neuro NP/APP and later by MD. Note/plan to be edited by MD as needed.    Lynnae January, DNP, AGACNP-BC [facility] Please use AMION for contact information & EPIC for messaging.   ATTENDING NOTE: I reviewed above note and agree with the assessment and plan. Pt was seen and examined.   RN at the bedside. Pt awake, alert, eyes open, severe dysarthria, mostly intangible. However able to repeat "I am" in significant dysarthric voice, not able repeat further sentence, not able to name. Able to follow some midline commands but not peripheral commands, however able to mimic some but not all of them. No gaze palsy, tracking bilaterally, not blinking to visual threat bilaterally, however seem to have right hemianopia by observation, PERRL. Right facial droop. Tongue midline inside mouth. Right upper extremity proximal 2/5, distal 0/5. Right lower extremity proximal 3/5, distal 1/5. Left upper and lower extremity at least 4/5. Sensation and coordination not corporative, gait not tested.   Echo today showed recurrent inferior large pseudoaneurysm with thrombus although no mobile thrombus. Likely the source of his stroke. EF 30-35%. No candidate for [facility] at this time, may consider in a week if stable. Eventually will need cardiac surgery. Continue TF and statin.   For detailed assessment and plan, please refer to above/below as I have made changes wherever appropriate.   Marvel Plan, MD PhD Stroke Neurology 10/07/2022 7:40 PM  This patient is critically ill due to bilateral stroke, status post TNK, significant send hemorrhagic transformation, CHF status post ICD, large heart pseudoaneurysm and at significant risk of neurological worsening, death form recurrent stroke, hemorrhagic transformation with  cerebral edema, brain herniation, heart failure, sepsis, aspiration. This patient's care requires constant monitoring of vital signs, hemodynamics, respiratory and cardiac monitoring, review of multiple databases, neurological assessment, discussion with family, other specialists and medical decision making of high complexity. I spent 40 minutes of neurocritical care time in the care of this patient. I discussed with Dr. Ernest Pine    To contact Stroke Continuity provider, please refer to [email or web address]. After hours, contact General Neurology

## 2022-10-07 NOTE — Consult Note (Addendum)
Cardiology Consultation   Patient ID: [name] MRN: [id number]; DOB: [birth year]  Admit date: 10/02/2022 Date of Consult: 10/07/2022  PCP:  Robert Matar, MD   [facility] Providers Cardiologist:  Robert Swaziland, MD  Electrophysiologist:  Robert Lemming, MD  {  Patient Profile:   [name] is a 62 y.o. male with a hx of CAD (s/p stenting to LCx and LAD in 2009, CABGx1 with SVG-OM in 2014, cath in 08/2020 showing CTO of mid-RCA and patent stents along LAD and LCx with presumed occlusion of SVG-OM with medical management recommended), HFrEF (EF 35-40% by echo in 04/2022), ICD in place (Medtronic ICD implanted in 05/2020), history of myopericarditis, pseudoaneurysm of left ventricle (s/p CorMatrix patch in 01/2013), HTN, HLD and prior CVA who is being seen 10/07/2022 for the evaluation of abnormal echocardiogram at the request of Dr. Roda Tucker.  History of Present Illness:   [name] presented to [name] on 10/02/2022 as a Code STROKE after developing new-onset left-sided weakness, speech changes, dizziness and facial droop. Initial CT Head showed no acute abnormalities and he received TNK. He developed worsening right-sided weakness later in the day along with aphasia and follow-up imaging showed a new short-segment occlusion of the distal left MCA M2 segment. He underwent mechanical thrombectomy with recanalization achieved. Afterwards, he was noted to have a small hemorrhage and follow-up imaging showed progressive hemorrhage in the left frontoparietal region with a 3 mm hematoma and SAH. MRI Brain on 6/19 showed progressive left frontal parietal hematoma with regional SAH.  Follow-up CT Head on 10/05/2021 showed unchanged size of the large intraparenchymal hematoma within the posterior left hemisphere with a new component of hemorrhage in the posterior right hemisphere.  By review of neurology notes, this was felt to be more likely right brain redistribution instead  of new hemorrhage. Most recent imaging showed no acute changes.  He did have an echocardiogram on 10/03/2022 and was overall poor quality. EF was unable to be determined but appeared to suggest wall motion abnormalities. A limited echocardiogram was obtained today which showed his EF was at 30 to 35% with wall motion abnormalities noted. Of concern, he was noted to have a large inferior pseudoaneurysm with a depth of 3 cm and a maximum width of 5 cm and the wall of the pseudoaneurysm was made up of a layer of thrombus which was roughly 1.5 cm thick with no evidence of mobile thrombus identified. This was overall concerning for redevelopment of pseudoaneurysm in the inferior wall that was repaired in 2014 with dehiscence of the patch used to correct the abnormality.  Was recommended to consider cardiac CTA or cMRI for further evaluation. He did have 11 beats NSVT by review of notes on 10/06/2022 with no recurrence by telemetry. Hs Troponin values were checked and flat at 45, 43, 39 and 35.  In talking with the patient today, he has dysarthria but nods his head yes and no to questions. Denies any pain currently. Says he was having shortness of breath prior to admission. No chest pain or palpitations. No pitting edema. No family currently at the bedside.    Past Medical History:  Diagnosis Date   Abnormal EKG    hx of ischemia showing up on ekg's   Acute myopericarditis    a. 03/2013: readm with hypoxia, tachycardia, elevated ESR/CRP, elevated troponin with Dressler syndrome and myopericarditis; treated with steroids.   Acute respiratory failure (HCC)    a. 01/2013: VDRF. b. 03/2013:  hypoxia requiring supp O2 during adm, resolved by discharge.   C. difficile colitis    a. 01/2013 during prolonged adm. b. Recurred 03/2013.   Cardiac tamponade    a. 01/2013 s/p drain.   Cataracts, bilateral    Coronary artery disease    a. s/p MI in 2009 in [place] with stenting of the LCX and LAD;  b. 12/2012 NSTEMI/CAD: LM  nl, LAD patent prox stent, LCX 50-70 isr (FFR 0.84), RCA dom, 127m, EF 40-45%-->Med Rx. c. 01/2013: anterolateral STEMI complicated by pericardial effusion (presumed purulent pericarditis) and tamponade s/p drain, ruptured LV pseudoaneurysm s/p CorMatrix patch, CABGx1 (SVG-OM1), fever, CVA, VDRF, C diff.   CVA (cerebral infarction)    a. 01/2013 in setting of prolonged hospitalization - residual L arm weakness.   Dressler syndrome (HCC)    a. 03/2013: readm with hypoxia, tachycardia, elevated ESR/CRP, elevated troponin with Dressler syndrome and myopericarditis; treated with steroids.   Glaucoma    Hyperlipidemia    Hypokalemia    Hyponatremia    a. During late 2014.   Ischemic cardiomyopathy    a. Sept/Oct 2014: EF ~40% (ICM). b. 03/2013: EF 25-30%. Off ACEI due to hypotension (MIXED NICM/ICM).   Optic neuropathy, ischemic    Pericardial effusion    a. 01/2013: pericardial effusion (presumed purulent pericarditis) and cardiac tamponade s/p drain. b. Persistent moderate pericardial effusion 03/2013.   Pre-diabetes    Pseudoaneurysm of left ventricle of heart    a. 01/2013:  Ruptured inferoposterior LV pseudoaneurysm s/p CorMatrix patch.   Sinus bradycardia    asymptomatic   Stroke [facility]) 2009   weak lt side-lt arm   Tobacco abuse    Wears dentures    top    Past Surgical History:  Procedure Laterality Date   CARDIAC CATHETERIZATION  01/06/2013   CATARACT EXTRACTION Bilateral 05/2021   COLONOSCOPY WITH PROPOFOL N/A 07/03/2016   Procedure: COLONOSCOPY WITH PROPOFOL;  Surgeon: Ruffin Frederick, MD;  Location: [facility];  Service: Gastroenterology;  Laterality: N/A;   CORONARY ANGIOPLASTY WITH STENT PLACEMENT  2000's X 2   "1 + 1" (01/06/2013)   CORONARY ARTERY BYPASS GRAFT N/A 01/28/2013   Procedure: CORONARY ARTERY BYPASS GRAFTING (CABG);  Surgeon: Loreli Slot, MD;  Location: [facility] OR;  Service: Open Heart Surgery;  Laterality: N/A;  CABG x one, using left greater  saphenous vein harvested endoscopically   ICD IMPLANT N/A 05/25/2020   Procedure: ICD IMPLANT;  Surgeon: Hillis Range, MD;  Location: [facility] INVASIVE CV LAB;  Service: Cardiovascular;  Laterality: N/A;   INTRAOPERATIVE TRANSESOPHAGEAL ECHOCARDIOGRAM N/A 01/28/2013   Procedure: INTRAOPERATIVE TRANSESOPHAGEAL ECHOCARDIOGRAM;  Surgeon: Loreli Slot, MD;  Location: [facility] OR;  Service: Open Heart Surgery;  Laterality: N/A;   IR CT HEAD LTD  10/03/2022   IR PERCUTANEOUS ART THROMBECTOMY/INFUSION INTRACRANIAL INC DIAG ANGIO  10/03/2022   IR US GUIDE VASC ACCESS RIGHT  10/03/2022   LEFT HEART CATH AND CORS/GRAFTS ANGIOGRAPHY N/A 09/13/2020   Procedure: LEFT HEART CATH AND CORS/GRAFTS ANGIOGRAPHY;  Surgeon: Tucker, Robert M, MD;  Location: [facility] INVASIVE CV LAB;  Service: Cardiovascular;  Laterality: N/A;   LEFT HEART CATHETERIZATION WITH CORONARY ANGIOGRAM N/A 01/06/2013   Procedure: LEFT HEART CATHETERIZATION WITH CORONARY ANGIOGRAM;  Surgeon: Robert M Swaziland, MD;  Location: [facility] CATH LAB;  Service: Cardiovascular;  Laterality: N/A;   LEFT HEART CATHETERIZATION WITH CORONARY ANGIOGRAM N/A 01/21/2013   Procedure: LEFT HEART CATHETERIZATION WITH CORONARY ANGIOGRAM;  Surgeon: Kathleene Hazel, MD;  Location: [facility] CATH LAB;  Service: Cardiovascular;  Laterality: N/A;   LUMBAR LAMINECTOMY/ DECOMPRESSION WITH MET-RX Right 06/30/2018   Procedure: Right minimally invasive Lumbar Three-Four Far lateral discectomy;  Surgeon: Jadene Pierini, MD;  Location: [facility] OR;  Service: Neurosurgery;  Laterality: Right;  Right minimally invasive Lumbar Three-Four Far lateral discectomy   MASS EXCISION Right 07/21/2014   Procedure: EXCISION RIGHT NECK MASS;  Surgeon: Harriette Bouillon, MD;  Location: [facility];  Service: General;  Laterality: Right;   PERICARDIAL TAP N/A 01/24/2013   Procedure: PERICARDIAL TAP;  Surgeon: Micheline Chapman, MD;  Location: [facility] CATH LAB;  Service: Cardiovascular;  Laterality: N/A;    RADIOLOGY WITH ANESTHESIA N/A 10/03/2022   Procedure: IR WITH ANESTHESIA;  Surgeon: Julieanne Cotton, MD;  Location: [facility] OR;  Service: Radiology;  Laterality: N/A;   RIGHT HEART CATHETERIZATION Right 01/24/2013   Procedure: RIGHT HEART CATH;  Surgeon: Micheline Chapman, MD;  Location: [facility] CATH LAB;  Service: Cardiovascular;  Laterality: Right;   VENTRICULAR ANEURYSM RESECTION N/A 01/28/2013   Procedure: LEFT VENTRICULAR ANEURYSM REPAIR;  Surgeon: Loreli Slot, MD;  Location: [facility] OR;  Service: Open Heart Surgery;  Laterality: N/A;     Home Medications:  Prior to Admission medications   Medication Sig Start Date End Date Taking? Authorizing Provider  ALPHAGAN P 0.1 % SOLN INSTILL 1 DROP INTO BOTH EYES TWICE A DAY Patient taking differently: Place 1 drop into both eyes in the morning and at bedtime. 10/26/19  Yes Robert Matar, MD  aspirin 81 MG EC tablet Take 1 tablet (81 mg total) by mouth daily. Restart on 07/05/2018 06/30/18  Yes Jadene Pierini, MD  atorvastatin (LIPITOR) 80 MG tablet Take 1 tablet (80 mg total) by mouth daily. 06/07/22  Yes Robert Matar, MD  carvedilol (COREG) 25 MG tablet Take 0.5 tablets (12.5 mg total) by mouth 2 (two) times daily with a meal. Patient taking differently: Take 25 mg by mouth 2 (two) times daily with a meal. 05/08/22  Yes Robert Matar, MD  Cholecalciferol (VITAMIN D3) 50 MCG (2000 UT) TABS Take 1 tablet by mouth daily.   Yes [provider]  clopidogrel (PLAVIX) 75 MG tablet Take 1 tablet (75 mg total) by mouth daily. 06/20/22  Yes Robert Matar, MD  dapagliflozin propanediol (FARXIGA) 10 MG TABS tablet TAKE 1 TABLET EVERY DAY Patient taking differently: Take 10 mg by mouth daily. 12/20/21  Yes Allred, Fayrene Fearing, MD  docusate sodium (COLACE) 100 MG capsule Take 100 mg by mouth 2 (two) times daily.   Yes [provider]  ENTRESTO 97-103 MG TAKE 1 TABLET TWICE DAILY Patient taking differently: Take 1 tablet by mouth 2  (two) times daily. 05/02/22  Yes Tucker, Robert M, MD  furosemide (LASIX) 20 MG tablet Take 1 tablet (20 mg total) by mouth daily. 02/01/21  Yes Tucker, Robert M, MD  gabapentin (NEURONTIN) 300 MG capsule Take 1 capsule (300 mg total) by mouth at bedtime. 09/18/21  Yes Robert Matar, MD  latanoprost (XALATAN) 0.005 % ophthalmic solution Place 1 drop into both eyes at bedtime. 05/31/16  Yes [provider]  Multiple Vitamins-Minerals (CENTRUM MEN) TABS Take 1 tablet by mouth daily.   Yes [provider]  spironolactone (ALDACTONE) 25 MG tablet Take 0.5 tablets (12.5 mg total) by mouth daily. 05/08/22  Yes Robert Matar, MD  fluticasone (FLONASE) 50 MCG/ACT nasal spray Place 1 spray into both nostrils daily. 08/12/21   Carlisle Beers, FNP  nitroGLYCERIN (NITROSTAT)  0.4 MG SL tablet Place 1 tablet (0.4 mg total) under the tongue every 5 (five) minutes x 3 doses as needed for chest pain. 09/14/20   Arty Baumgartner, NP    Inpatient Medications: Scheduled Meds:  Chlorhexidine Gluconate Cloth  6 each Topical Daily   feeding supplement (PROSource TF20)  60 mL Per Tube Daily   gabapentin  300 mg Per Tube QHS   heparin injection (subcutaneous)  5,000 Units Subcutaneous Q8H   multivitamin with minerals  1 tablet Per Tube Daily   nicotine  7 mg Transdermal Daily   pantoprazole (PROTONIX) IV  40 mg Intravenous QHS   polyethylene glycol  17 g Oral BID   senna-docusate  1 tablet Oral BID   Continuous Infusions:  feeding supplement (OSMOLITE 1.5 CAL) 65 mL/hr at 10/07/22 0900   PRN Meds: acetaminophen **OR** acetaminophen (TYLENOL) oral liquid 160 mg/5 mL **OR** acetaminophen, ondansetron (ZOFRAN) IV, mouth rinse, senna-docusate  Allergies:   No Known Allergies  Social History:   Social History   Socioeconomic History   Marital status: Single    Spouse name: Not on file   Number of children: 2   Years of education: 12th   Highest education level: Not on file   Occupational History   Occupation: N/A  Tobacco Use   Smoking status: Every Day    Packs/day: 1.00    Years: 35.00    Additional pack years: 0.00    Total pack years: 35.00    Types: Cigarettes   Smokeless tobacco: Never  Vaping Use   Vaping Use: Never used  Substance and Sexual Activity   Alcohol use: No    Alcohol/week: 0.0 standard drinks of alcohol   Drug use: No   Sexual activity: Yes  Other Topics Concern   Not on file  Social History Narrative   Lives in [place] with wife.  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  He is now retired on disability.  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      Caffeine Use: very rarely   Social Determinants of Corporate investment banker Strain: Not on file  Food Insecurity: Not on file  Transportation Needs: Not on file  Physical Activity: Not on file  Stress: Not on file  Social Connections: Not on file  Intimate Partner Violence: Not on file    Family History:    Family History  Problem Relation Age of Onset   Heart attack Mother    Diabetes Mother    Hypertension Mother    Hypertension Father    CAD Other    HIV Brother    Stroke Neg Hx      ROS:  Please see the history of present illness.   All other ROS reviewed and negative.     Physical Exam/Data:   Vitals:   10/07/22 0500 10/07/22 0600 10/07/22 0700 10/07/22 0800  BP: 136/84 126/72 122/79   Pulse: 81 78 87   Resp: (!) 25 (!) 27 (!) 21   Temp:    (!) 101 F (38.3 C)  TempSrc:    Oral  SpO2: 99% 97% 98%   Weight:      Height:        Intake/Output Summary (Last 24 hours) at 10/07/2022 1102 Last data filed at 10/07/2022 0900 Gross per 24 hour  Intake 802.08 ml  Output 750 ml  Net 52.08 ml      10/07/2022    3:28 AM 10/06/2022  4:00 AM 10/05/2022    5:00 AM  Last 3 Weights  Weight (lbs) 242 lb 8.1 oz 244 lb 4.3 oz 238 lb 5.1 oz  Weight (kg) 110 kg 110.8 kg 108.1 kg     Body mass index is 31.99 kg/m.  General: Appears in no acute  distress.  HEENT: facial drop noted.  Neck: no JVD Vascular: No carotid bruits; Distal pulses 2+ bilaterally Cardiac:  normal S1, S2; RRR; no murmur  Lungs:  clear to auscultation bilaterally, no wheezing, rhonchi or rales  Abd: soft, nontender, no hepatomegaly  Ext: no pitting  edema Musculoskeletal:  No deformities. Skin: warm and dry  Psych:  Normal affect. Communication challenging given dysarthria. Nods head yes and no to questions.   EKG:  The EKG was personally reviewed and demonstrates:  NSR, HR 74 with inferior infarct pattern and TWI along the inferior and lateral leads which is similar to prior tracings.  Telemetry:  Telemetry was personally reviewed and demonstrates: NSR, HR in 50's to 60's with occasional PVC's.   Relevant CV Studies:  Cardiac Catheterization: 08/2020 Prox LAD lesion is 40% stenosed. Prox LAD to Mid LAD lesion is 10% stenosed. Prox Cx to Mid Cx lesion is 20% stenosed. Ost Cx to Prox Cx lesion is 50% stenosed. Prox RCA to Mid RCA lesion is 100% stenosed. SVG graft was not visualized due to known occlusion. Origin to Prox Graft lesion is 100% stenosed. LV end diastolic pressure is normal.   1. Single vessel occlusive CAD with CTO of the mid RCA.  2. Patent stents in the mid LAD and LCx.  3. No SVG to OM visualized but is felt to be occluded. 4. Low LVEDP 3 mm Hg   Plan: recommend continued medical management.   Echocardiogram: 04/2022 IMPRESSIONS     1. Left ventricular ejection fraction, by estimation, is 35 to 40%. The  left ventricle has moderately decreased function. The left ventricle  demonstrates regional wall motion abnormalities (see scoring  diagram/findings for description).  Inferior/inferolateral akinesis. There is mild left ventricular  hypertrophy. Left ventricular diastolic parameters are consistent with  Grade II diastolic dysfunction (pseudonormalization). Elevated left atrial  pressure.   2. Right ventricular systolic  function was not well visualized. The right  ventricular size is not well visualized.   3. The mitral valve is normal in structure. No evidence of mitral valve  regurgitation. No evidence of mitral stenosis.   4. The aortic valve is tricuspid. Aortic valve regurgitation is not  visualized. No aortic stenosis is present.   5. Agitated saline contrast bubble study was positive with shunting  observed within 3-6 cardiac cycles suggestive of interatrial shunt. Poor  quality images on bubble study, but significant shunting seen   Limited Echocardiogram: 10/07/2022 IMPRESSIONS     1. There is a large inferior pseudoaneurysm with a depth of 3 cm and a  maximum width of roughly 5 cm. The "neck" at the level of the inferior  wall measures 2.3 cm. The wall of the pseudoaneurysm is made up of a layer  of thrombus that is roughly 1.5 cm  thick. No mobile thrombus is seen.. Left ventricular ejection fraction, by  estimation, is 30 to 35%. The left ventricle has moderately decreased  function. The left ventricle demonstrates regional wall motion  abnormalities (see scoring diagram/findings  for description). The left ventricular internal cavity size was mildly  dilated. There is akinesis of the left ventricular, basal-mid inferior  wall and inferolateral wall. There is severe  hypokinesis of the left  ventricular, basal-mid inferoseptal wall.  There is severe hypokinesis of the left ventricular, basal-mid lateral  wall. There is peudoaneurysm of the left ventricular, basal inferior wall.   Conclusion(s)/Recommendation(s): Findings are highly concerning for  redevelopment of pseudoaneurysm of the inferior wall that was repaired in  2014. There appears to be dehiscence of the patch used to correct  abnormality, with development of new  pseudoaneurysm. No protruding or mobile thrombi are seen in the  pseudoaneurysm, but a thick layer of thrombus makes up the external wall  of the pseudoaneurysm.  Redcommend CT angiography or MR angiography of the  heart for precise definition.     Laboratory Data:  High Sensitivity Troponin:   Recent Labs  Lab 10/06/22 1032 10/06/22 1257 10/06/22 1453 10/06/22 1748  TROPONINIHS 45* 43* 39* 35*     Chemistry Recent Labs  Lab 10/04/22 1355 10/04/22 1959 10/05/22 0746 10/06/22 0231 10/07/22 0817  NA 144   < > 146* 148* 144  K  --   --  3.8 3.9 4.2  CL  --   --  117* 117* 111  CO2  --   --  20* 22 22  GLUCOSE  --   --  147* 145* 179*  BUN  --   --  13 13 16   CREATININE  --   --  0.92 0.96 1.10  CALCIUM  --   --  8.1* 8.9 8.9  MG 2.2  --  2.1 2.1  --   GFRNONAA  --   --  >60 >60 >60  ANIONGAP  --   --  9 9 11    < > = values in this interval not displayed.    Recent Labs  Lab 10/02/22 1228  PROT 7.0  ALBUMIN 4.0  AST 19  ALT 16  ALKPHOS 117  BILITOT 0.6   Lipids  Recent Labs  Lab 10/03/22 0058  CHOL 124  TRIG 67  HDL 48  LDLCALC 63  CHOLHDL 2.6    Hematology Recent Labs  Lab 10/04/22 0525 10/05/22 0746 10/07/22 0817  WBC 9.4 8.3 7.6  RBC 4.67 4.14* 4.26  HGB 13.7 12.3* 12.9*  HCT 41.7 37.2* 38.6*  MCV 89.3 89.9 90.6  MCH 29.3 29.7 30.3  MCHC 32.9 33.1 33.4  RDW 13.2 13.4 13.3  PLT 110* 89* 119*   Thyroid No results for input(s): "TSH", "FREET4" in the last 168 hours.  BNPNo results for input(s): "BNP", "PROBNP" in the last 168 hours.  DDimer No results for input(s): "DDIMER" in the last 168 hours.   Radiology/Studies:   CT HEAD WO CONTRAST ( )  Result Date: 10/06/2022 CLINICAL DATA:  Hemorrhage follow-up EXAM: CT HEAD WITHOUT CONTRAST TECHNIQUE: Contiguous axial images were obtained from the base of the skull through the vertex without intravenous contrast. RADIATION DOSE REDUCTION: This exam was performed according to the departmental dose-optimization program which includes automated exposure control, adjustment of the mA and/or kV according to patient size and/or use of iterative reconstruction  technique. COMPARISON:  10/06/2022 at 12 06 a.m. FINDINGS: Brain: Unchanged appearance of bilateral mixed intraparenchymal and subarachnoid hemorrhage. Trace rightward midline shift is unchanged. No hydrocephalus. No new site of hemorrhage. Vascular: No hyperdense vessel or unexpected calcification. Skull: Normal. Negative for fracture or focal lesion. Sinuses/Orbits: No acute finding. Other: None. IMPRESSION: Unchanged appearance of bilateral mixed intraparenchymal and subarachnoid hemorrhage. Trace rightward midline shift is unchanged. No hydrocephalus. Electronically Signed   By: Chrisandra Netters.D.  On: 10/06/2022 20:30   CT HEAD WO CONTRAST ( )  Result Date: 10/06/2022 CLINICAL DATA:  Hemorrhage follow-up EXAM: CT HEAD WITHOUT CONTRAST TECHNIQUE: Contiguous axial images were obtained from the base of the skull through the vertex without intravenous contrast. RADIATION DOSE REDUCTION: This exam was performed according to the departmental dose-optimization program which includes automated exposure control, adjustment of the mA and/or kV according to patient size and/or use of iterative reconstruction technique. COMPARISON:  10/03/2022 FINDINGS: Brain: Unchanged size of large intraparenchymal hematoma within the posterior left hemisphere. There is now a component of intraparenchymal hemorrhage in the posterior right hemisphere that measures approximately 2.5 x 1.4 cm. There is increased subarachnoid blood over the posterior right hemisphere and the cerebellum. No hydrocephalus. Trace rightward midline shift. Intraventricular blood is less clearly visualized on this study. Vascular: No hyperdense vessel or unexpected calcification. Skull: Normal. Negative for fracture or focal lesion. Sinuses/Orbits: No acute finding. Other: None. IMPRESSION: 1. Unchanged size of large intraparenchymal hematoma within the posterior left hemisphere. 2. New component of intraparenchymal hemorrhage in the posterior right  hemisphere that measures approximately 2.5 x 1.4 cm. 3. Increased subarachnoid blood over the posterior right hemisphere and the cerebellum. Electronically Signed   By: [name] M.D.   On: 10/06/2022 00:41   DG CHEST PORT 1 VIEW  Result Date: 10/05/2022 CLINICAL DATA:  Fevers EXAM: PORTABLE CHEST 1 VIEW COMPARISON:  05/04/2022 FINDINGS: Cardiac shadow is enlarged. Defibrillator is again seen. Feeding catheter is noted extending into the stomach. The lungs are well aerated with minimal right basilar atelectasis. No bony abnormality is noted. IMPRESSION: Minimal right basilar atelectasis. Electronically Signed   By: Alcide Clever M.D.   On: 10/05/2022 09:56   DG Swallowing Func-Speech Pathology  Result Date: 10/04/2022 Table formatting from the original result was not included. Modified Barium Swallow Study Patient Details Name: EMILE KYLLO MRN: [id number] Date of Birth: Feb 02, 1961 Today's Date: 10/04/2022 HPI/PMH: HPI: KOREY PRASHAD is a 62 y.o. African American male with PMH of CAD, CHF s/p ICD, HLD, strokes, smoker presented to ED for code stroke. Progressed hemorrhage in the left frontal parietal region with 3 cm  hematoma and regional subarachnoid hemorrhage. Probable underlying  cortex infarct.  Underwent DIAGNOSTIC CEREBRAL ANGIOGRAM AND MECHANICAL THROMBECTOMY on 6/19 due to worsening of symptoms. F?u MRI also shows Small acute right cerebellar infarct. Pt has a history of unexplained gagging in 2016. Had MBS, SLP recommended chin tuck? Clinical Impression: Pt demonstrates a moderate oral dysphagia due to left CN VII and XII weakness. Tongue protrudes from gums on left.  Did not test chewable solid as pt did not demonstrate enough awareness or lingual control and does not have denittion. Will need to trial this gradually at bedside.  There is pooling of liquids in the left lateral sulcus and floor of mouth. Pt accepts any method of intake but is able to sip from a straw well after some  groping effort.  Pt uses repetitieve lingual thrusting to gradually propel liquid and puree boluses with pooling in the pyriform sinuses before the swallow. This leads to sensed aspiration before the swallow with thin liquids consumed sequentially. Pt tolerated large sequential sips of nectar thick liquids from a straw without aspiration. Sensation of aspiration is immediate and cough, though impaired, is strong and effective to eject. Pill lodged mid esophagus, pt needed large sips to dilate the esophagus to pass the pill. Recommend dys 1/nectar thick liquids. Factors that may increase risk of adverse event in  presence of aspiration [name] & [name] 2021): Factors that may increase risk of adverse event in presence of aspiration [name] & [name] 2021): Limited mobility; Inadequate oral hygiene; Dependence for feeding and/or oral hygiene Recommendations/Plan: Swallowing Evaluation Recommendations Swallowing Evaluation Recommendations Recommendations: PO diet PO Diet Recommendation: Dysphagia 1 (Pureed); Mildly thick liquids (Level 2, nectar thick) Medication Administration: Crushed with puree Supervision: Staff to assist with self-feeding Swallowing strategies  : Slow rate; Small bites/sips; Check for anterior loss; Check for pocketing or oral holding Postural changes: Stay upright 30-60 min after meals Oral care recommendations: Oral care BID (2x/day) Caregiver Recommendations: Have oral suction available Treatment Plan Treatment Plan Treatment recommendations: Therapy as outlined in treatment plan below Follow-up recommendations: Acute inpatient rehab (3 hours/day) Functional status assessment: Patient has had a recent decline in their functional status and demonstrates the ability to make significant improvements in function in a reasonable and predictable amount of time. Treatment frequency: Min 2x/week Treatment duration: 2 weeks Interventions: Aspiration precaution training; Compensatory techniques;  Patient/family education; Trials of upgraded texture/liquids; Diet toleration management by SLP Recommendations Recommendations for follow up therapy are one component of a multi-disciplinary discharge planning process, led by the attending physician.  Recommendations may be updated based on patient status, additional functional criteria and insurance authorization. Assessment: Orofacial Exam: Orofacial Exam Oral Cavity - Dentition: Dentures, top; Dentures, bottom; Edentulous Anatomy: Anatomy: WFL Boluses Administered: Boluses Administered Boluses Administered: Thin liquids (Level 0); Mildly thick liquids (Level 2, nectar thick); Puree; Solid; Moderately thick liquids (Level 3, honey thick)  Oral Impairment Domain: Oral Impairment Domain Lip Closure: Escape from interlabial space or lateral juncture, no extension beyond vermillion border Tongue control during bolus hold: Not tested Bolus preparation/mastication: Minimal chewing/mashing with majority of bolus unchewed Bolus transport/lingual motion: Repetitive/disorganized tongue motion Oral residue: Residue collection on oral structures Location of oral residue : Floor of mouth; Lateral sulci Initiation of pharyngeal swallow : Pyriform sinuses  Pharyngeal Impairment Domain: Pharyngeal Impairment Domain Soft palate elevation: No bolus between soft palate (SP)/pharyngeal wall (PW) Laryngeal elevation: Complete superior movement of thyroid cartilage with complete approximation of arytenoids to epiglottic petiole Anterior hyoid excursion: Complete anterior movement Epiglottic movement: Complete inversion Laryngeal vestibule closure: Complete, no air/contrast in laryngeal vestibule Pharyngeal stripping wave : Present - complete Tongue base retraction: No contrast between tongue base and posterior pharyngeal wall (PPW) Pharyngeal residue: Trace residue within or on pharyngeal structures Location of pharyngeal residue: Pyriform sinuses  Esophageal Impairment Domain:  Esophageal Impairment Domain Esophageal clearance upright position: Esophageal retention Pill: Pill Consistency administered: Puree Puree: Impaired (see clinical impressions) Penetration/Aspiration Scale Score: Penetration/Aspiration Scale Score 1.  Material does not enter airway: Thin liquids (Level 0); Mildly thick liquids (Level 2, nectar thick); Moderately thick liquids (Level 3, honey thick); Puree; Pill 8.  Material enters airway, passes BELOW cords without attempt by patient to eject out (silent aspiration) : Thin liquids (Level 0) Compensatory Strategies: No data recorded  General Information: Caregiver present: No  Diet Prior to this Study: NPO; Cortrak/Small bore NG tube   Temperature : Normal   Respiratory Status: WFL   Supplemental O2: None (Room air)   History of Recent Intubation: No  Behavior/Cognition: Alert; Requires cueing Self-Feeding Abilities: Needs assist with self-feeding Baseline vocal quality/speech: Abnormal resonance Volitional Cough: Able to elicit Volitional Swallow: Unable to elicit No data recorded Goal Planning: Prognosis for improved oropharyngeal function: Fair Barriers to Reach Goals: Severity of deficits No data recorded No data recorded Consulted and agree with results and recommendations: Pt  unable/family or caregiver not available Pain: Pain Assessment Pain Assessment: Faces Faces Pain Scale: 0 Facial Expression: 0 Body Movements: 0 Muscle Tension: 0 Compliance with ventilator (intubated pts.): N/A Vocalization (extubated pts.): 0 CPOT Total: 0 End of Session: Start Time:SLP Start Time (ACUTE ONLY): 1200 Stop Time: SLP Stop Time (ACUTE ONLY): 1230 Time Calculation:SLP Time Calculation (min) (ACUTE ONLY): 30 min Charges: SLP Evaluations $ SLP Speech Visit: 1 Visit SLP Evaluations $BSS Swallow: 1 Procedure $MBS Swallow: 1 Procedure $ SLP EVAL LANGUAGE/SOUND PRODUCTION: 1 Procedure $Swallowing Treatment: 1 Procedure $Speech Treatment for Individual: 1 Procedure SLP visit  diagnosis: SLP Visit Diagnosis: Dysphagia, oropharyngeal phase (R13.12) Past Medical History: Past Medical History: Diagnosis Date  Abnormal EKG   hx of ischemia showing up on ekg's  Acute myopericarditis   a. 03/2013: readm with hypoxia, tachycardia, elevated ESR/CRP, elevated troponin with Dressler syndrome and myopericarditis; treated with steroids.  Acute respiratory failure (HCC)   a. 01/2013: VDRF. b. 03/2013: hypoxia requiring supp O2 during adm, resolved by discharge.  C. difficile colitis   a. 01/2013 during prolonged adm. b. Recurred 03/2013.  Cardiac tamponade   a. 01/2013 s/p drain.  Cataracts, bilateral   Coronary artery disease   a. s/p MI in 2009 in [place] with stenting of the LCX and LAD;  b. 12/2012 NSTEMI/CAD: LM nl, LAD patent prox stent, LCX 50-70 isr (FFR 0.84), RCA dom, 171m, EF 40-45%-->Med Rx. c. 01/2013: anterolateral STEMI complicated by pericardial effusion (presumed purulent pericarditis) and tamponade s/p drain, ruptured LV pseudoaneurysm s/p CorMatrix patch, CABGx1 (SVG-OM1), fever, CVA, VDRF, C diff.  CVA (cerebral infarction)   a. 01/2013 in setting of prolonged hospitalization - residual L arm weakness.  Dressler syndrome (HCC)   a. 03/2013: readm with hypoxia, tachycardia, elevated ESR/CRP, elevated troponin with Dressler syndrome and myopericarditis; treated with steroids.  Glaucoma   Hyperlipidemia   Hypokalemia   Hyponatremia   a. During late 2014.  Ischemic cardiomyopathy   a. Sept/Oct 2014: EF ~40% (ICM). b. 03/2013: EF 25-30%. Off ACEI due to hypotension (MIXED NICM/ICM).  Optic neuropathy, ischemic   Pericardial effusion   a. 01/2013: pericardial effusion (presumed purulent pericarditis) and cardiac tamponade s/p drain. b. Persistent moderate pericardial effusion 03/2013.  Pre-diabetes   Pseudoaneurysm of left ventricle of heart   a. 01/2013:  Ruptured inferoposterior LV pseudoaneurysm s/p CorMatrix patch.  Sinus bradycardia   asymptomatic  Stroke [facility]) 2009  weak lt side-lt arm   Tobacco abuse   Wears dentures   top Past Surgical History: Past Surgical History: Procedure Laterality Date  CARDIAC CATHETERIZATION  01/06/2013  CATARACT EXTRACTION Bilateral 05/2021  COLONOSCOPY WITH PROPOFOL N/A 07/03/2016  Procedure: COLONOSCOPY WITH PROPOFOL;  Surgeon: Ruffin Frederick, MD;  Location: [facility];  Service: Gastroenterology;  Laterality: N/A;  CORONARY ANGIOPLASTY WITH STENT PLACEMENT  2000's X 2  "1 + 1" (01/06/2013)  CORONARY ARTERY BYPASS GRAFT N/A 01/28/2013  Procedure: CORONARY ARTERY BYPASS GRAFTING (CABG);  Surgeon: Loreli Slot, MD;  Location: [facility] OR;  Service: Open Heart Surgery;  Laterality: N/A;  CABG x one, using left greater saphenous vein harvested endoscopically  ICD IMPLANT N/A 05/25/2020  Procedure: ICD IMPLANT;  Surgeon: Hillis Range, MD;  Location: [facility] INVASIVE CV LAB;  Service: Cardiovascular;  Laterality: N/A;  INTRAOPERATIVE TRANSESOPHAGEAL ECHOCARDIOGRAM N/A 01/28/2013  Procedure: INTRAOPERATIVE TRANSESOPHAGEAL ECHOCARDIOGRAM;  Surgeon: Loreli Slot, MD;  Location: [facility] OR;  Service: Open Heart Surgery;  Laterality: N/A;  IR CT HEAD LTD  10/03/2022  IR PERCUTANEOUS ART THROMBECTOMY/INFUSION INTRACRANIAL  INC DIAG ANGIO  10/03/2022  IR US GUIDE VASC ACCESS RIGHT  10/03/2022  LEFT HEART CATH AND CORS/GRAFTS ANGIOGRAPHY N/A 09/13/2020  Procedure: LEFT HEART CATH AND CORS/GRAFTS ANGIOGRAPHY;  Surgeon: Tucker, Robert M, MD;  Location: [facility] INVASIVE CV LAB;  Service: Cardiovascular;  Laterality: N/A;  LEFT HEART CATHETERIZATION WITH CORONARY ANGIOGRAM N/A 01/06/2013  Procedure: LEFT HEART CATHETERIZATION WITH CORONARY ANGIOGRAM;  Surgeon: Robert M Swaziland, MD;  Location: [facility] CATH LAB;  Service: Cardiovascular;  Laterality: N/A;  LEFT HEART CATHETERIZATION WITH CORONARY ANGIOGRAM N/A 01/21/2013  Procedure: LEFT HEART CATHETERIZATION WITH CORONARY ANGIOGRAM;  Surgeon: Kathleene Hazel, MD;  Location: [facility] CATH LAB;  Service: Cardiovascular;  Laterality: N/A;  LUMBAR  LAMINECTOMY/ DECOMPRESSION WITH MET-RX Right 06/30/2018  Procedure: Right minimally invasive Lumbar Three-Four Far lateral discectomy;  Surgeon: Jadene Pierini, MD;  Location: [facility] OR;  Service: Neurosurgery;  Laterality: Right;  Right minimally invasive Lumbar Three-Four Far lateral discectomy  MASS EXCISION Right 07/21/2014  Procedure: EXCISION RIGHT NECK MASS;  Surgeon: Harriette Bouillon, MD;  Location: [facility];  Service: General;  Laterality: Right;  PERICARDIAL TAP N/A 01/24/2013  Procedure: PERICARDIAL TAP;  Surgeon: Micheline Chapman, MD;  Location: [facility] CATH LAB;  Service: Cardiovascular;  Laterality: N/A;  RADIOLOGY WITH ANESTHESIA N/A 10/03/2022  Procedure: IR WITH ANESTHESIA;  Surgeon: Julieanne Cotton, MD;  Location: [facility] OR;  Service: Radiology;  Laterality: N/A;  RIGHT HEART CATHETERIZATION Right 01/24/2013  Procedure: RIGHT HEART CATH;  Surgeon: Micheline Chapman, MD;  Location: [facility] CATH LAB;  Service: Cardiovascular;  Laterality: Right;  VENTRICULAR ANEURYSM RESECTION N/A 01/28/2013  Procedure: LEFT VENTRICULAR ANEURYSM REPAIR;  Surgeon: Loreli Slot, MD;  Location: [facility] OR;  Service: Open Heart Surgery;  Laterality: N/A; DeBlois, [name] 10/04/2022, 1:32 PM  VAS [place] TRANSCRANIAL DOPPLER W BUBBLES  Result Date: 10/04/2022  Transcranial Doppler with Bubble Patient Name:  YAN PANKRATZ  Date of Exam:   10/03/2022 Medical Rec #: [id number]          Accession #:    [id number] Date of Birth: 09/11/1960           Patient Gender: M Patient Age:   59 years Exam Location:  [facility] Procedure:      VAS [place] TRANSCRANIAL DOPPLER W BUBBLES Referring Phys: PRAMOD SETHI --------------------------------------------------------------------------------  Indications: Stroke. Limitations: Constant patient movement during examination. Comparison Study: No prior studies. Performing Technologist: Jean Rosenthal RDMS, RVT  Examination Guidelines: A complete evaluation includes B-mode  imaging, spectral Doppler, color Doppler, and power Doppler as needed of all accessible portions of each vessel. Bilateral testing is considered an integral part of a complete examination. Limited examinations for reoccurring indications may be performed as noted.  Summary:  A vascular evaluation was performed. The right middle cerebral artery was studied. An IV was inserted into the patient's left forearm. Verbal informed consent was obtained.  Greater than 50 high intensity transient signals observed at rest, indicating a Spencer Grade 3-4 patent foramen ovale (PFO). Patient unable to perform valsalva maneuver. Positive TCD Bubble study indicative of a medium size right to left shunt noted at rest *See table(s) above for TCD measurements and observations.  Diagnosing physician: Delia Heady MD Electronically signed by Delia Heady MD on 10/04/2022 at 6:58:23 AM.    Final    VAS [place] LOWER EXTREMITY VENOUS (DVT)  Result Date: 10/03/2022  Lower Venous DVT Study Patient Name:  DEITRICK FERRERI  Date of Exam:   10/03/2022 Medical Rec #:  [id number]          Accession #:    [id number] Date of Birth: 05/06/1960           Patient Gender: M Patient Age:   82 years Exam Location:  [facility] Procedure:      VAS [place] LOWER EXTREMITY VENOUS (DVT) Referring Phys: PRAMOD SETHI --------------------------------------------------------------------------------  Indications: Stroke. PFO positive.  Comparison Study: No prior studies. Performing Technologist: Jean Rosenthal RDMS, RVT  Examination Guidelines: A complete evaluation includes B-mode imaging, spectral Doppler, color Doppler, and power Doppler as needed of all accessible portions of each vessel. Bilateral testing is considered an integral part of a complete examination. Limited examinations for reoccurring indications may be performed as noted. The reflux portion of the exam is performed with the patient in reverse Trendelenburg.   +---------+---------------+---------+-----------+----------+--------------+ RIGHT    CompressibilityPhasicitySpontaneityPropertiesThrombus Aging +---------+---------------+---------+-----------+----------+--------------+ CFV      Full           Yes      Yes                                 +---------+---------------+---------+-----------+----------+--------------+ SFJ      Full                                                        +---------+---------------+---------+-----------+----------+--------------+ FV Prox  Full                                                        +---------+---------------+---------+-----------+----------+--------------+ FV Mid   Full                                                        +---------+---------------+---------+-----------+----------+--------------+ FV DistalFull                                                        +---------+---------------+---------+-----------+----------+--------------+ PFV      Full                                                        +---------+---------------+---------+-----------+----------+--------------+ POP      Full           Yes      Yes                                 +---------+---------------+---------+-----------+----------+--------------+ PTV      Full                                                        +---------+---------------+---------+-----------+----------+--------------+  PERO     Full                                                        +---------+---------------+---------+-----------+----------+--------------+   +---------+---------------+---------+-----------+----------+--------------+ LEFT     CompressibilityPhasicitySpontaneityPropertiesThrombus Aging +---------+---------------+---------+-----------+----------+--------------+ CFV      Full           Yes      Yes                                  +---------+---------------+---------+-----------+----------+--------------+ SFJ      Full                                                        +---------+---------------+---------+-----------+----------+--------------+ FV Prox  Full                                                        +---------+---------------+---------+-----------+----------+--------------+ FV Mid   Full                                                        +---------+---------------+---------+-----------+----------+--------------+ FV DistalFull                                                        +---------+---------------+---------+-----------+----------+--------------+ PFV      Full                                                        +---------+---------------+---------+-----------+----------+--------------+ POP      Full           Yes      Yes                                 +---------+---------------+---------+-----------+----------+--------------+ PTV      Full                                                        +---------+---------------+---------+-----------+----------+--------------+ PERO     Full                                                        +---------+---------------+---------+-----------+----------+--------------+  Summary: RIGHT: - There is no evidence of deep vein thrombosis in the lower extremity.  - No cystic structure found in the popliteal fossa.  LEFT: - There is no evidence of deep vein thrombosis in the lower extremity.  - No cystic structure found in the popliteal fossa.  *See table(s) above for measurements and observations. Electronically signed by Coral Else MD on 10/03/2022 at 11:41:22 PM.    Final    CT HEAD WO CONTRAST ( )  Result Date: 10/03/2022 CLINICAL DATA:  Altered mental status EXAM: CT HEAD WITHOUT CONTRAST TECHNIQUE: Contiguous axial images were obtained from the base of the skull through the vertex without intravenous  contrast. RADIATION DOSE REDUCTION: This exam was performed according to the departmental dose-optimization program which includes automated exposure control, adjustment of the mA and/or kV according to patient size and/or use of iterative reconstruction technique. COMPARISON:  Brain MRI and head CT 10/03/2022 FINDINGS: Brain: As demonstrated on the earlier MRI, the left frontal/parietal hematoma has increased in size since the prior head CT. It now measures 8.2 x 3.9 x 3.9 cm. There is new extension into the left lateral ventricle. The left convexity subarachnoid component have also worsened. Trace rightward midline shift. Vascular: No hyperdense vessel or unexpected calcification. Skull: Normal. Negative for fracture or focal lesion. Sinuses/Orbits: No acute finding. Other: None. IMPRESSION: 1. Since the prior head CT, increase in size of the left frontal/parietal hematoma, now measuring 8.2 x 3.9 x 3.9 cm. 2. New extension into the left lateral ventricle. Electronically Signed   By: [name] M.D.   On: 10/03/2022 19:51   DG Abd Portable 1V  Result Date: 10/03/2022 CLINICAL DATA:  Feeding tube placement. EXAM: PORTABLE ABDOMEN - 1 VIEW COMPARISON:  None Available. FINDINGS: Distal tip of feeding tube is seen in expected position of distal stomach. No abnormal bowel dilatation. IMPRESSION: Distal tip of feeding tube is seen in expected position of distal stomach. Electronically Signed   By: Lupita Raider M.D.   On: 10/03/2022 16:06   MR BRAIN WO CONTRAST  Result Date: 10/03/2022 CLINICAL DATA:  Stroke follow-up EXAM: MRI HEAD WITHOUT CONTRAST TECHNIQUE: Multiplanar, multiecho pulse sequences of the brain and surrounding structures were obtained without intravenous contrast. COMPARISON:  Head CT from earlier today FINDINGS: Brain: Collection in the left frontal parietal region with fluid and clot like appearance measuring up to 7 cm with regional subarachnoid material from hemorrhage, notably  progressed from CT earlier today. Small acute infarct in the superior right cerebellum. Multiple chronic bilateral cerebellar and posterior cerebral infarcts. Scattered chronic blood products along the chronic infarcts. Increase in local mass effect although no midline shift or herniation. Vascular: Major flow voids are preserved Skull and upper cervical spine: Normal marrow signal. Sinuses/Orbits: Bilateral cataract resection. At signing of report Dr. Pearlean Brownie is aware of findings as confirmed in epic chat. IMPRESSION: 1. Progressive left frontal parietal hematoma with cavity measuring ~ 7 cm. Regional subarachnoid hemorrhage is likely also progressed. There is increased local mass effect although no midline shift or herniation. 2. Assessing regional diffusion signal is complicated by the blood products, suspect underlying infarct. 3. Small acute right cerebellar infarct. 4. Numerous chronic cerebral and cerebellar infarcts with chronic blood products. Electronically Signed   By: Tiburcio Pea M.D.   On: 10/03/2022 13:05     Assessment and Plan:   1. Abnormal Echocardiogram/Pseudoaneurysm of Left Ventricle - He underwent repair in 01/2013 with CorMatrix patch and this was performed by Dr. Dorris Fetch by  review of records. Repeat echo this admission shows a large inferior pseudoaneurysm with a depth of 3 cm and a maximum width of 5 cm and the wall of the pseudoaneurysm is made up of a layer of thrombus which is roughly 1.5 cm thick with no evidence of mobile thrombus identified.  - Will review with Dr. Royann Shivers in regards to preference for Cardiac CT or cMRI further evaluation. Would need to start anticoagulation once cleared to do so by Neurology. Will ultimately need CT Surgery consult but not a surgical candidate at this time given his acute CVA.  2. CAD - He underwent prior stenting to LCx and LAD in 2009, CABGx1 with SVG-OM in 2014 and most recent cath in 08/2020 showed CTO of mid-RCA and patent stents  along LAD and LCx with presumed occlusion of SVG-OM with medical management recommended.  - Hs Troponin values have been flat this admission, peaking at 45. Not consistent with ACS. He was on ASA 81mg  daily, Plavix 75mg  daily and Atorvastatin 80mg  daily prior to admission but currently held given NPO status.   3. HFrEF  - His EF was at 35-40% by echo in 04/2022, at 30-35% by repeat imaging this admission. He appears euvolemic by examination today. PTA Coreg, Fargixa, Entresto, Spironolactone and Lasix currently held given NPO status. Once able to take PO medications, would gradually restart GDMT as BP allows and would review with Neurology in regards to BP goals.   4. ICD in place  - He is s/p [employer] ICD implanted in 05/2020. Most recent interrogation in 08/2022 showed normal device function.   5. CVA complicated by ICH and SAH - Received TNK on admission but developed worsening symptoms and required mechanical thrombectomy on 6/19. This was complicated by ICH and SAH but overall stable by repeat imaging.  - Management per Neurology.   For questions or updates, please contact [facility] Please consult [email or web address] for contact info under    Signed, Ellsworth Lennox, PA-C  10/07/2022 11:02 AM  I have seen and examined the patient along with Ellsworth Lennox, PA-C .  I have reviewed the chart, notes and new data.  I agree with PA/NP's note.  Key new complaints:dysarthric, but appears to have normal comprehension of speech and can answer with movements of the head; Key examination changes:  right hemiparesis, no overt signs of hypervolemia/HF exacerbation. No pericardial rub Key new findings / data:  now w embolic L MCA stroke, complicated by hemorrhage; he had acute R cerbellar infarct in January; he has "many remote infarcts in bilateral frontal, bilateral parietal lobes and bilateral cerebellum; reviewed echo which shows a pseudoaneurysm of the base of the inferior wall, in  the same territory where he had a patch repair in 2014. ECG shows chronic Q waves and ST elevation in the inferior leads and V-5-V6  PLAN: Suspect he has dehiscence of the LV pseudoaneurysm patch with a new pseudoaneurysm, that could well be the source of his multiple embolic infarcts.  Will plan gated CT angio of the heart to fully define the problem. Unfortunately, cannot start anticoagulation due to the ICH (discussed w Dr. Roda Tucker - wait for at least a week after ICH that occurred on 06/19). Ultimately, if his neuro recovery allows, he would need long term anticoagulation until he is felt to be healthy enough to undergo repeat pseudoaneurysm repair (a very high risk operation). Prognosis is very guarded.  Thurmon Fair, MD, [facility] [phone] 10/07/2022, 1:15 PM

## 2022-10-07 NOTE — Progress Notes (Signed)
SLP Cancellation Note  Patient Details Name: [name] MRN: [id number] DOB: 03-Dec-1960   Cancelled treatment:       Reason Eval/Treat Not Completed: Other (comment) Pt is currently on caseload for SLP and a dysphagia 1 diet with nectar thick liquids was recommended after the MBS on 10/03/21. New orders received on 6/22 with notes indicating, "Patient choked on his dysphagia 3 breakfast this morning and began vomiting." Chart review reveals that pt was being fed by RN on on 6/22, and "took one bite of pancake and immediately started coughing and choking. He coughed until he vomited." Pt was made NPO thereafter. No change in diet order noted in pt's chart between 6/20 and 6/22 so it appears that pt just received the wrong tray on 6/22. Pt's case discussed with Robert Bradford, RN today who advised that pt has been tolerating the recommended dysphagia 1 solids and nectar thick liquids without any new difficulty or s/s of aspiration. SLP was advised that the pt will be placed back on SLP's recommended diet of dysphagia 1 with nectar thick liquids and that re-assessment can be deferred today.   [name] I. Vear Clock, MS, CCC-SLP Neuro Diagnostic Specialist  Acute Rehabilitation Services Office number: [phone]  Scheryl Marten 10/07/2022, 1:59 PM

## 2022-10-08 ENCOUNTER — Inpatient Hospital Stay (HOSPITAL_COMMUNITY): Payer: Medicare PPO

## 2022-10-08 ENCOUNTER — Ambulatory Visit: Payer: Medicare PPO | Admitting: Student

## 2022-10-08 DIAGNOSIS — I629 Nontraumatic intracranial hemorrhage, unspecified: Secondary | ICD-10-CM

## 2022-10-08 DIAGNOSIS — I253 Aneurysm of heart: Secondary | ICD-10-CM | POA: Diagnosis not present

## 2022-10-08 DIAGNOSIS — I2510 Atherosclerotic heart disease of native coronary artery without angina pectoris: Secondary | ICD-10-CM

## 2022-10-08 DIAGNOSIS — I63412 Cerebral infarction due to embolism of left middle cerebral artery: Secondary | ICD-10-CM | POA: Diagnosis not present

## 2022-10-08 DIAGNOSIS — Z95810 Presence of automatic (implantable) cardiac defibrillator: Secondary | ICD-10-CM

## 2022-10-08 DIAGNOSIS — I63413 Cerebral infarction due to embolism of bilateral middle cerebral arteries: Secondary | ICD-10-CM | POA: Diagnosis not present

## 2022-10-08 LAB — GLUCOSE, CAPILLARY
Glucose-Capillary: 121 mg/dL — ABNORMAL HIGH (ref 70–99)
Glucose-Capillary: 122 mg/dL — ABNORMAL HIGH (ref 70–99)
Glucose-Capillary: 140 mg/dL — ABNORMAL HIGH (ref 70–99)
Glucose-Capillary: 151 mg/dL — ABNORMAL HIGH (ref 70–99)
Glucose-Capillary: 162 mg/dL — ABNORMAL HIGH (ref 70–99)
Glucose-Capillary: 162 mg/dL — ABNORMAL HIGH (ref 70–99)

## 2022-10-08 NOTE — Progress Notes (Addendum)
STROKE TEAM PROGRESS NOTE   INTERVAL HISTORY Needs cardiac CT per cardiology but will have to wait until he can tolerate beta blockers for HR control. Will transfer out of ICU today.  TTE today shows EF 20-25% with large inferior pseudoaneurysm, probable source of embolic infarcts.  Cardiology consulted, suspects dehiscence of previous pseudoaneurysm patch with new pseudoaneurysm, would want anticoagulation and repeat pseudoaneurysm repair once healthy enough. Unfortunately, patient is not candidate for needed anticoagulation for this at this time due to hemorrhages, would need to wait at least a week after hemorrhage occurred (6/26 or later). Overnight CT shows unchanged appearance of IPH and subarachnoid hemorrhage. Patient continues to have severe dysarthria, right facial droop left-sided weakness.   Vitals:   10/08/22 0400 10/08/22 0500 10/08/22 0541 10/08/22 0800  BP:   (!) 144/89   Pulse:   78   Resp:   (!) 22   Temp: (!) 100.8 F (38.2 C)  98.6 F (37 C) 100.2 F (37.9 C)  TempSrc: Oral  Oral Oral  SpO2:   94%   Weight:  109.9 kg    Height:       CBC:  Recent Labs  Lab 10/02/22 1228 10/02/22 1236 10/05/22 0746 10/07/22 0817  WBC 5.5   < > 8.3 7.6  NEUTROABS 2.1  --   --   --   HGB 16.7   < > 12.3* 12.9*  HCT 50.0   < > 37.2* 38.6*  MCV 88.0   < > 89.9 90.6  PLT 148*   < > 89* 119*   < > = values in this interval not displayed.    Basic Metabolic Panel:  Recent Labs  Lab 10/04/22 1355 10/04/22 1959 10/05/22 0746 10/06/22 0231 10/07/22 0817  NA 144   < > 146* 148* 144  K  --   --  3.8 3.9 4.2  CL  --   --  117* 117* 111  CO2  --   --  20* 22 22  GLUCOSE  --   --  147* 145* 179*  BUN  --   --  13 13 16   CREATININE  --   --  0.92 0.96 1.10  CALCIUM  --   --  8.1* 8.9 8.9  MG 2.2  --  2.1 2.1  --   PHOS 2.3*  --  2.5  --   --    < > = values in this interval not displayed.    Lipid Panel:  Recent Labs  Lab 10/03/22 0058  CHOL 124  TRIG 67  HDL 48   CHOLHDL 2.6  VLDL 13  LDLCALC 63    HgbA1c:  Recent Labs  Lab 10/03/22 0058  HGBA1C 5.8*    Urine Drug Screen:  Recent Labs  Lab 10/02/22 2140  LABOPIA NONE DETECTED  COCAINSCRNUR NONE DETECTED  LABBENZ NONE DETECTED  AMPHETMU NONE DETECTED  THCU NONE DETECTED  LABBARB NONE DETECTED     Alcohol Level  Recent Labs  Lab 10/02/22 1228  ETH <10     IMAGING past 24 hours ECHOCARDIOGRAM LIMITED  Result Date: 10/07/2022    ECHOCARDIOGRAM LIMITED REPORT   Patient Name:   [name] Date of Exam: 10/07/2022 Medical Rec #:  [id number]         Height:       73.0 in Accession #:    [id number]        Weight:       242.5 lb Date of Birth:  Aug 27, 1960          BSA:          2.335 m Patient Age:    62 years          BP:           139/69 mmHg Patient Gender: M                 HR:           63 bpm. Exam Location:  Inpatient Procedure: Limited Echo Indications:    Stroke i63.9  History:        Patient has prior history of Echocardiogram examinations, most                 recent 10/03/2022. CHF, CAD, Defibrillator; Risk                 Factors:Hypertension and Dyslipidemia. History of LV                 pseudoaneurysm repair in 2014.  Sonographer:    Irving Burton Senior RDCS Referring Phys: [id number] Scheryl Marten [place] IMPRESSIONS  1. There is a large inferior pseudoaneurysm with a depth of 3 cm and a maximum width of roughly 5 cm. The "neck" at the level of the inferior wall measures 2.3 cm. The wall of the pseudoaneurysm is made up of a layer of thrombus that is roughly 1.5 cm thick. No mobile thrombus is seen.. Left ventricular ejection fraction, by estimation, is 30 to 35%. The left ventricle has moderately decreased function. The left ventricle demonstrates regional wall motion abnormalities (see scoring diagram/findings for description). The left ventricular internal cavity size was mildly dilated. There is akinesis of the left ventricular, basal-mid inferior wall and inferolateral wall. There is severe  hypokinesis of the left ventricular, basal-mid inferoseptal wall. There is severe hypokinesis of the left ventricular, basal-mid lateral wall. There is peudoaneurysm of the left ventricular, basal inferior wall. Conclusion(s)/Recommendation(s): Findings are highly concerning for redevelopment of pseudoaneurysm of the inferior wall that was repaired in 2014. There appears to be dehiscence of the patch used to correct abnormality, with development of new pseudoaneurysm. No protruding or mobile thrombi are seen in the pseudoaneurysm, but a thick layer of thrombus makes up the external wall of the pseudoaneurysm. Redcommend CT angiography or MR angiography of the heart for precise definition. FINDINGS  Left Ventricle: There is a large inferior pseudoaneurysm with a depth of 3 cm and a maximum width of roughly 5 cm. The "neck" at the level of the inferior wall measures 2.3 cm. The wall of the pseudoaneurysm is made up of a layer of thrombus that is roughly 1.5 cm thick. No mobile thrombus is seen. Left ventricular ejection fraction, by estimation, is 30 to 35%. The left ventricle has moderately decreased function. The left ventricle demonstrates regional wall motion abnormalities. Severe hypokinesis of the left ventricular, basal-mid inferoseptal wall. Severe hypokinesis of the left ventricular, basal-mid lateral wall. The left ventricular internal cavity size was mildly dilated. There is no left ventricular hypertrophy.  LV Wall Scoring: The basal inferior segment is aneurysmal. The posterior wall, basal anterior segment, and mid inferior segment are akinetic. The antero-lateral wall, mid anteroseptal segment, mid inferoseptal segment, mid anterior segment, and basal inferoseptal segment are hypokinetic. The entire apex and basal anteroseptal segment are normal. Pericardium: There is no evidence of pericardial effusion. Additional Comments: A device lead is visualized.  LEFT VENTRICLE PLAX 2D LVIDd:  5.60 cm  LVIDs:         4.80 cm LV PW:         1.00 cm LV IVS:        1.10 cm  LV Volumes (MOD) LV vol d, MOD A2C: 127.0 ml LV vol d, MOD A4C: 138.0 ml LV vol s, MOD A2C: 87.6 ml LV vol s, MOD A4C: 91.9 ml LV SV MOD A2C:     39.4 ml LV SV MOD A4C:     138.0 ml LV SV MOD BP:      42.8 ml Mihai Croitoru MD Electronically signed by Thurmon Fair MD Signature Date/Time: 10/07/2022/10:55:28 AM    Final      Physical Exam  Constitutional: Appears well-developed and well-nourished.  Cardiovascular: Normal rate and regular rhythm.  Respiratory: Effort normal on RA GI: Soft.  No distension. There is no tenderness.  Skin: WDI  Neuro: Mental Status: Patient is awake, alert, speech is dysarthric. Unable to assess orientation  Follows simple commands Cranial Nerves: II: Pupils are equal, round, and reactive to light.   III,IV, VI: EOMI without ptosis or diplopia.  VII: Mild facial droop  VIII: Hearing is intact to voice X: Palate is midline and palate elevates symmetrically, phonation intact XII: tongue is midline without atrophy or fasciculations.  Motor: Tone is normal. Bulk is normal. Trace L sided weakness. RLE 3/5. RUE decreased strength 2/5   ASSESSMENT/PLAN [name] is a 62 y.o. male with history of CAD s/p PCI w/ stent to LCS and LAD, s/p 1v CABG, multiple CVAs (R cerebellar 04/2022),  syscolic HF s/p ICD, HLD, HTN presenting with slurred speech and L sided weakness now s/p TNK and IR thrombectomy of L MCA (M3 segment) c/b  hemorrhage.   TIA - Right brain stroke aborted s/p TNK Stroke - Left MCA and right cerebellar infarcts left M3 occlusion s/p IR with TICIs, complicated by L frontoparietal ICH and SAH, etiology likely cardioembolic source  CT 6/18 no acute finding, but the bilateral frontal, parietal, occipital and cerebellum infarcts CTA head and neck unremarkable CTA head and neck repeat new short segment occlusion of distal left M2 S/p IR with left M3 TICIs reperfusion  post IR  CT Hyperdensity within the left sylvian fissure may represent small subarachnoid hemorrhage versus contrast. CT Head: Progressed hemorrhage in L frontoparietal region w/ 3cm hematoma and regional SAH MRI  progressive L fronto parietal hematoma (~7cm), regional SAH, increased mass effect.  Likely underlying infarct. Sm R cerebellar infarct, acute. Numerous chornic cerebral and cerebellar infarcts Repeat CT 6/19 increase in size of the left frontal/parietal hematoma. New extension into the left lateral ventricle. CT repeat 6/22 x 2 - New component of intraparenchymal hemorrhage in the posterior right hemisphere that measures approximately 2.5 x 1.4 cm. (On my review, more like right brain redistribution instead of new hemorrhage)  Repeat CT 6/23: Unchanged appearance of bilateral mixed intraparenchymal and subarachnoid hemorrhage. Trace rightward midline shift is unchanged. No hydrocephalus Echo: Large inferior pseudoaneurysm with a depth of 3 cm and a maximum width of roughly 5 cm the wall of the pseudoaneurysm is made above the layer of thrombus that is roughly 1.5 cm thick. EF 30 to 35%, severe hypokinesis of left ventricle. No afib with ICD interrogation LE venous doppler neg for DVT TCD bubble study showed moderate sized PFO LDL 63 HgbA1c 5.8 UDS neg VTE prophylaxis - SQH  aspirin 81 mg daily and clopidogrel 75 mg daily prior to admission, now  on No antithrombotic due to hemorrhage. May need to consider [facility] in a week (6/26) given significant embolic risk.  Therapy recommendations:  CIR Disposition:  ICU  CHF s/p ICD Hypertension Systolic HF, s/p ICD 2/2 ICM.  Echo EF 30-35%  Trop 45 -> 43 -> 39 -> 35 CK 3714, CKMB 3.5 Home meds:  coreg 12.5mg  BID, entresto 97-103mg  BID, spironolactone 25mg  Stable on the low end, hold antihypertensives Goal SBP < 160, bolus PRN, hold home antihypertensives  Long-term BP goal normotensive  Large inferior cardiac pseudoaneurysm Previous inferior  pseudoaneurysm repaired in 2014 Echo: Large inferior pseudoaneurysm with a depth of 3 cm and a maximum width of roughly 5 cm the wall of the pseudoaneurysm is made above the layer of thrombus that is roughly 1.5 cm thick.  Cardiology on board, appreciate assistance  Plan for CTA of the heart once able to have beta blocker Possibly able to start anticoagulation in 1 week post hemorrhage. Ultimately patient will need to repeat undergo repeat pseudoaneurysm repair  Hyperlipidemia Home meds:  atorvastatin 80 LDL 63, goal < 70 On lipitor 80 Continue statin at discharge  Tobacco abuse Current smoker Smoking cessation counseling will be provided Nicotine patch provided  Fever Tmax 101.6 -> 99.8-> afebrile->100.8 No leukocytosis UA no UTI.  CXR without opacity.  Procalc negative.  Will hold on antibiotics for now.   Dysphagia  Vomiting on zofran PRN, improved Pt ate well, will d/c nocturnal TF Encourage po intake Resume PO meds  PFO Known PFO in the past TCD bubble study showed a moderate-sized PFO LE venous Doppler negative for DVT  Not an emergent issue at this time  Other stroke risk factors Obesity with BMI 32.23 CAD  Other Active Problems Thrombocytopenia, platelet 110->89  Hospital day # 6   Patient seen and examined by NP/APP with MD. MD to update note as needed.   Elmer Picker, DNP, FNP-BC [facility] Pager: [phone]   ATTENDING NOTE: I reviewed above note and agree with the assessment and plan. Pt was seen and examined.   No family at the bedside. Pt lying in bed on his left side. Still has expressive aphasia, able to follow commands. Still has right sided weakness, but neuro stable and unchanged. Echo showed large inferior cardiac pseudoaneurysm, discussed with Dr. Salena Saner cardiology, will need CTA of the heart but will need beta blocker to keep HR less than 60. Currently BP still soft, will hold off beta blocker and CTA for now. Will also  consider AC in 2-3 days if neuro stable. Pt ate well, trying to d/c nocturnal TF. Still has low grade fever, but no leukocytosis, will continue monitoring. CIR coordinator recommend possible SNF.   For detailed assessment and plan, please refer to above/below as I have made changes wherever appropriate.   Marvel Plan, MD PhD Stroke Neurology 10/08/2022 7:04 PM  This patient is critically ill due to bilateral stroke, status post TNK, significant send hemorrhagic transformation, CHF status post ICD, large heart pseudoaneurysm and at significant risk of neurological worsening, death form recurrent stroke, hemorrhagic transformation with cerebral edema, brain herniation, heart failure, sepsis, aspiration. This patient's care requires constant monitoring of vital signs, hemodynamics, respiratory and cardiac monitoring, review of multiple databases, neurological assessment, discussion with family, other specialists and medical decision making of high complexity. I spent 35 minutes of neurocritical care time in the care of this patient. I discussed with Dr. Ernest Pine   To contact Stroke Continuity provider, please refer to [email or web address]. After hours,  contact General Neurology

## 2022-10-08 NOTE — Progress Notes (Signed)
Physical Therapy Treatment Patient Details Name: [name] MRN: [id number] DOB: 1960/09/12 Today's Date: 10/08/2022   History of Present Illness Patient is a 62 y/o male admitted 10/03/22 with L weakness and numbness.  Received TNK then developed now symptoms of aphasia and R weakness.  He underwent mechanical thrombectomy L M3 segment MCA noted to have small hemorrhage and follow up CT showed progression of hemorrhage 3cm then 7cm and small acute R cerebellar infarct. PMH positive for CAD s/p PCI w/stent and 1v CABG, multiple CGAs, systolic HF s/p ICD, HLD, HTN.    PT Comments    Pt with increased lethargy today and required increased assist for bed mobility and unable to transition to standing or OOB transfers.  Requiring max A for transition to EOB and difficulty gaining balance today pt pushing to R side and nodding "yes" to wanting to return to sleep.  Overall limited by lethargy today, hopeful to continue progression with therapy once more alert.    Recommendations for follow up therapy are one component of a multi-disciplinary discharge planning process, led by the attending physician.  Recommendations may be updated based on patient status, additional functional criteria and insurance authorization.  Follow Up Recommendations       Assistance Recommended at Discharge Frequent or constant Supervision/Assistance  Patient can return home with the following Two people to help with walking and/or transfers;A lot of help with bathing/dressing/bathroom;Assistance with cooking/housework;Direct supervision/assist for medications management;Assist for transportation;Help with stairs or ramp for entrance;Assistance with feeding   Equipment Recommendations  Other (comment) (TBA)    Recommendations for Other Services Rehab consult     Precautions / Restrictions Precautions Precautions: Fall Precaution Comments: R inattention; SBP <160     Mobility  Bed Mobility Overal bed  mobility: Needs Assistance Bed Mobility: Rolling, Sidelying to Sit Rolling: Mod assist Sidelying to sit: Max assist Sit to Supine: Max x 2       General bed mobility comments: Rolled to R with assist and cues to reach for rail and flex L leg; then max A to lift trunk    Transfers                   General transfer comment: Pt too lethargic to attempt OOB today; kept trying to return to supiner    Ambulation/Gait                   Stairs             Wheelchair Mobility    Modified Rankin (Stroke Patients Only) Modified Rankin (Stroke Patients Only) Pre-Morbid Rankin Score: Moderate disability Modified Rankin: Severe disability     Balance Overall balance assessment: Needs assistance Sitting-balance support: Feet supported, Single extremity supported, Bilateral upper extremity supported Sitting balance-Leahy Scale: Poor Sitting balance - Comments: Pt pushing to right side today.  Tried multiple positions at EOB including reaching for foot board with L UE, down onto L elbow, and therapist supporting R on bed.  Requiring max A at EOB. Only able to tolerate about 5 mins and then nodding that wants to return to bed.                                    Cognition Arousal/Alertness: Lethargic Behavior During Therapy: Flat affect Overall Cognitive Status: Difficult to assess  General Comments: R inattention-could look R with verbal cues, expressive and receptive aphasia, difficulty staying awake today        Exercises      General Comments General comments (skin integrity, edema, etc.): VSS on RA.  HR in 50's-70's throughout session.  BP stable      Pertinent Vitals/Pain Pain Assessment Pain Assessment: Faces Faces Pain Scale: No hurt    Home Living                          Prior Function            PT Goals (current goals can now be found in the care plan section)  Progress towards PT goals: Not progressing toward goals - comment (lethargic today)    Frequency    Min 4X/week      PT Plan Current plan remains appropriate    Co-evaluation              AM-PAC PT "6 Clicks" Mobility   Outcome Measure  Help needed turning from your back to your side while in a flat bed without using bedrails?: A Lot Help needed moving from lying on your back to sitting on the side of a flat bed without using bedrails?: Total Help needed moving to and from a bed to a chair (including a wheelchair)?: Total Help needed standing up from a chair using your arms (e.g., wheelchair or bedside chair)?: Total Help needed to walk in hospital room?: Total Help needed climbing 3-5 steps with a railing? : Total 6 Click Score: 7    End of Session   Activity Tolerance: Patient limited by lethargy Patient left: in bed;with bed alarm set;with call bell/phone within reach Nurse Communication: Mobility status PT Visit Diagnosis: Other abnormalities of gait and mobility (R26.89);Other symptoms and signs involving the nervous system (R29.898);Hemiplegia and hemiparesis Hemiplegia - Right/Left: Right Hemiplegia - dominant/non-dominant: Dominant Hemiplegia - caused by: Cerebral infarction;Other Nontraumatic intracranial hemorrhage     Time: 1453-1511 PT Time Calculation (min) (ACUTE ONLY): 18 min  Charges:  $Therapeutic Activity: 8-22 mins                     Anise Salvo, PT Acute Rehab [facility] Rehab [phone]    Rayetta Humphrey 10/08/2022, 3:23 PM

## 2022-10-08 NOTE — Progress Notes (Signed)
Rounding Note    Patient Name: [name] Date of Encounter: 10/08/2022  [facility] Cardiologist: Peter Sw[place], MD   Subjective   Alert and understands speech, follows commands, but unable to answer questions other than with shakes of head or nods of head.  Severely dysarthric.  Dense right hemiparesis.  Inpatient Medications    Scheduled Meds:  atorvastatin  80 mg Per Tube Daily   Chlorhexidine Gluconate Cloth  6 each Topical Daily   feeding supplement (PROSource TF20)  60 mL Per Tube Daily   gabapentin  300 mg Per Tube QHS   heparin injection (subcutaneous)  5,000 Units Subcutaneous Q8H   multivitamin with minerals  1 tablet Per Tube Daily   nicotine  7 mg Transdermal Daily   pantoprazole (PROTONIX) IV  40 mg Intravenous QHS   polyethylene glycol  17 g Oral BID   senna-docusate  1 tablet Oral BID   Continuous Infusions:  feeding supplement (OSMOLITE 1.5 CAL) 65 mL/hr at 10/07/22 1800   PRN Meds: acetaminophen **OR** acetaminophen (TYLENOL) oral liquid 160 mg/5 mL **OR** acetaminophen, ondansetron (ZOFRAN) IV, mouth rinse, senna-docusate   Vital Signs    Vitals:   10/08/22 0400 10/08/22 0500 10/08/22 0541 10/08/22 0800  BP:   (!) 144/89   Pulse:   78   Resp:   (!) 22   Temp: (!) 100.8 F (38.2 C)  98.6 F (37 C) 100.2 F (37.9 C)  TempSrc: Oral  Oral Oral  SpO2:   94%   Weight:  109.9 kg    Height:        Intake/Output Summary (Last 24 hours) at 10/08/2022 0957 Last data filed at 10/08/2022 0500 Gross per 24 hour  Intake 1570 ml  Output 1475 ml  Net 95 ml      10/08/2022    5:00 AM 10/07/2022    3:28 AM 10/06/2022    4:00 AM  Last 3 Weights  Weight (lbs) 242 lb 4.6 oz 242 lb 8.1 oz 244 lb 4.3 oz  Weight (kg) 109.9 kg 110 kg 110.8 kg      Telemetry    Sinus rhythm with occasional PVCs- Personally Reviewed  ECG    No new tracing- Personally Reviewed  Physical Exam  Obese.  NG tube. GEN: No acute distress.   Neck: No  JVD Cardiac: RRR, no murmurs, rubs, or gallops.  Respiratory: Clear to auscultation bilaterally. GI: Soft, nontender, non-distended  MS: No edema; No deformity. Neuro: Right facial droop and hemiparesis. Psych: Normal affect   Labs    High Sensitivity Troponin:   Recent Labs  Lab 10/06/22 1032 10/06/22 1257 10/06/22 1453 10/06/22 1748  TROPONINIHS 45* 43* 39* 35*     Chemistry Recent Labs  Lab 10/02/22 1228 10/02/22 1236 10/04/22 1355 10/04/22 1959 10/05/22 0746 10/06/22 0231 10/07/22 0817  NA 139   < > 144   < > 146* 148* 144  K 4.0   < >  --   --  3.8 3.9 4.2  CL 101   < >  --   --  117* 117* 111  CO2 23   < >  --   --  20* 22 22  GLUCOSE 81   < >  --   --  147* 145* 179*  BUN 9   < >  --   --  13 13 16   CREATININE 1.10   < >  --   --  0.92 0.96 1.10  CALCIUM 9.4   < >  --   --  8.1* 8.9 8.9  MG  --    < > 2.2  --  2.1 2.1  --   PROT 7.0  --   --   --   --   --   --   ALBUMIN 4.0  --   --   --   --   --   --   AST 19  --   --   --   --   --   --   ALT 16  --   --   --   --   --   --   ALKPHOS 117  --   --   --   --   --   --   BILITOT 0.6  --   --   --   --   --   --   GFRNONAA >60   < >  --   --  >60 >60 >60  ANIONGAP 15   < >  --   --  9 9 11    < > = values in this interval not displayed.    Lipids  Recent Labs  Lab 10/03/22 0058  CHOL 124  TRIG 67  HDL 48  LDLCALC 63  CHOLHDL 2.6    Hematology Recent Labs  Lab 10/04/22 0525 10/05/22 0746 10/07/22 0817  WBC 9.4 8.3 7.6  RBC 4.67 4.14* 4.26  HGB 13.7 12.3* 12.9*  HCT 41.7 37.2* 38.6*  MCV 89.3 89.9 90.6  MCH 29.3 29.7 30.3  MCHC 32.9 33.1 33.4  RDW 13.2 13.4 13.3  PLT 110* 89* 119*   Thyroid No results for input(s): "TSH", "FREET4" in the last 168 hours.  BNPNo results for input(s): "BNP", "PROBNP" in the last 168 hours.  DDimer No results for input(s): "DDIMER" in the last 168 hours.   Radiology    ECHOCARDIOGRAM LIMITED  Result Date: 10/07/2022    ECHOCARDIOGRAM LIMITED REPORT    Patient Name:   [name] Date of Exam: 10/07/2022 Medical Rec #:  [id number]         Height:       73.0 in Accession #:    [id number]        Weight:       242.5 lb Date of Birth:  Jun 08, 1960          BSA:          2.335 m Patient Age:    62 years          BP:           139/69 mmHg Patient Gender: M                 HR:           63 bpm. Exam Location:  Inpatient Procedure: Limited Echo Indications:    Stroke i63.9  History:        Patient has prior history of Echocardiogram examinations, most                 recent 10/03/2022. CHF, CAD, Defibrillator; Risk                 Factors:Hypertension and Dyslipidemia. History of LV                 pseudoaneurysm repair in 2014.  Sonographer:    Irving Burton Senior RDCS Referring Phys: [id number] Scheryl Marten XU IMPRESSIONS  1. There is a large inferior pseudoaneurysm with a depth of 3 cm and a  maximum width of roughly 5 cm. The "neck" at the level of the inferior wall measures 2.3 cm. The wall of the pseudoaneurysm is made up of a layer of thrombus that is roughly 1.5 cm thick. No mobile thrombus is seen.. Left ventricular ejection fraction, by estimation, is 30 to 35%. The left ventricle has moderately decreased function. The left ventricle demonstrates regional wall motion abnormalities (see scoring diagram/findings for description). The left ventricular internal cavity size was mildly dilated. There is akinesis of the left ventricular, basal-mid inferior wall and inferolateral wall. There is severe hypokinesis of the left ventricular, basal-mid inferoseptal wall. There is severe hypokinesis of the left ventricular, basal-mid lateral wall. There is peudoaneurysm of the left ventricular, basal inferior wall. Conclusion(s)/Recommendation(s): Findings are highly concerning for redevelopment of pseudoaneurysm of the inferior wall that was repaired in 2014. There appears to be dehiscence of the patch used to correct abnormality, with development of new pseudoaneurysm. No protruding or  mobile thrombi are seen in the pseudoaneurysm, but a thick layer of thrombus makes up the external wall of the pseudoaneurysm. Redcommend CT angiography or MR angiography of the heart for precise definition. FINDINGS  Left Ventricle: There is a large inferior pseudoaneurysm with a depth of 3 cm and a maximum width of roughly 5 cm. The "neck" at the level of the inferior wall measures 2.3 cm. The wall of the pseudoaneurysm is made up of a layer of thrombus that is roughly 1.5 cm thick. No mobile thrombus is seen. Left ventricular ejection fraction, by estimation, is 30 to 35%. The left ventricle has moderately decreased function. The left ventricle demonstrates regional wall motion abnormalities. Severe hypokinesis of the left ventricular, basal-mid inferoseptal wall. Severe hypokinesis of the left ventricular, basal-mid lateral wall. The left ventricular internal cavity size was mildly dilated. There is no left ventricular hypertrophy.  LV Wall Scoring: The basal inferior segment is aneurysmal. The posterior wall, basal anterior segment, and mid inferior segment are akinetic. The antero-lateral wall, mid anteroseptal segment, mid inferoseptal segment, mid anterior segment, and basal inferoseptal segment are hypokinetic. The entire apex and basal anteroseptal segment are normal. Pericardium: There is no evidence of pericardial effusion. Additional Comments: A device lead is visualized.  LEFT VENTRICLE PLAX 2D LVIDd:         5.60 cm LVIDs:         4.80 cm LV PW:         1.00 cm LV IVS:        1.10 cm  LV Volumes (MOD) LV vol d, MOD A2C: 127.0 ml LV vol d, MOD A4C: 138.0 ml LV vol s, MOD A2C: 87.6 ml LV vol s, MOD A4C: 91.9 ml LV SV MOD A2C:     39.4 ml LV SV MOD A4C:     138.0 ml LV SV MOD BP:      42.8 ml Empress Newmann MD Electronically signed by Thurmon Fair MD Signature Date/Time: 10/07/2022/10:55:28 AM    Final    CT HEAD WO CONTRAST ( )  Result Date: 10/06/2022 CLINICAL DATA:  Hemorrhage follow-up EXAM:  CT HEAD WITHOUT CONTRAST TECHNIQUE: Contiguous axial images were obtained from the base of the skull through the vertex without intravenous contrast. RADIATION DOSE REDUCTION: This exam was performed according to the departmental dose-optimization program which includes automated exposure control, adjustment of the mA and/or kV according to patient size and/or use of iterative reconstruction technique. COMPARISON:  10/06/2022 at 12 06 a.m. FINDINGS: Brain: Unchanged appearance of bilateral mixed intraparenchymal  and subarachnoid hemorrhage. Trace rightward midline shift is unchanged. No hydrocephalus. No new site of hemorrhage. Vascular: No hyperdense vessel or unexpected calcification. Skull: Normal. Negative for fracture or focal lesion. Sinuses/Orbits: No acute finding. Other: None. IMPRESSION: Unchanged appearance of bilateral mixed intraparenchymal and subarachnoid hemorrhage. Trace rightward midline shift is unchanged. No hydrocephalus. Electronically Signed   By: [name] M.D.   On: 10/06/2022 20:30    Cardiac Studies   Echocardiogram 10/07/2022  1. There is a large inferior pseudoaneurysm with a depth of 3 cm and a maximum width of roughly 5 cm. The "neck" at the level of the inferior wall measures 2.3 cm. The wall of the pseudoaneurysm is made up of a layer of thrombus that is roughly 1.5 cm thick. No mobile thrombus is seen.. Left ventricular ejection fraction, by estimation, is 30 to 35%. The left ventricle has moderately decreased function. The left ventricle demonstrates regional wall motion abnormalities (see scoring diagram/findings for description). The left ventricular internal cavity size was mildly dilated. There is akinesis of the left ventricular, basal-mid inferior wall and inferolateral wall. There is severe hypokinesis of the left ventricular, basal-mid inferoseptal wall. There is severe hypokinesis of the left ventricular, basal-mid lateral wall. There is peudoaneurysm of the left  ventricular, basal inferior wall. Conclusion(s)/Recommendation(s): Findings are highly concerning for redevelopment of pseudoaneurysm of the inferior wall that was repaired in 2014. There appears to be dehiscence of the patch used to correct abnormality, with development of new pseudoaneurysm. No protruding or mobile thrombi are seen in the pseudoaneurysm, but a thick layer of thrombus makes up the external wall of the pseudoaneurysm. Redcommend CT angiography or MR angiography of the heart for precise definition.  Patient Profile     62 y.o. male longstanding of history of CAD,s/p CABG and inferior wall pseudoaneurysm patch 2014, presents with acute embolic stroke and severe right hemiparesis/dysarthria complicated by hemorrhagic transformation of acute stroke as well as areas of multiple old strokes, all of which have embolic appearance.  Echocardiogram is concerning for dehiscence of the pseudoaneurysm patch, with broad communication between the left ventricular cavity and a thrombus filled pseudoaneurysm.  Assessment & Plan    LV pseudoaneurysm: Would like to further evaluate the left ventricular pseudoaneurysm with a CT angiogram.  This will require administration of beta-blockers to allow the heart rate not much faster than 65 bpm and to help suppress PVCs.  Currently his blood pressure is around 110/60s and need to avoid hypotension with the recent embolic stroke.  Will wait for another couple of days, coordinate with neurology/stroke team.  Will require initiation of oral anticoagulants, when it is felt to be safe from a neurological point of view.  Once on anticoagulation will not restart antiplatelet. HFrEF: Thankfully, despite moderately depressed left ventricular systolic function there is no clinical evidence of heart failure at this time.  Has an Abbott ICD in place.  Might be able to use this to help judge fluid status via thoracic impedance (he was enrolled in the heart failure device clinic  as an outpatient).  On carvedilol, Farxiga, Entresto, spironolactone chronically, all of these are currently on hold. CAD: No evidence for recent acute coronary syndrome.  On aspirin and high-dose statin. Embolic stroke: Presented with a right brain stroke aborted with TNK, but then had a left MCA and right cerebellar acute infarct treated with thrombectomy, but complicated by left frontoparietal intracranial hemorrhage and subarachnoid hemorrhage.  Has right hemiparesis, inability to speak, unable to swallow.  Receiving  speech therapy.  He also happens to have a moderate-sized PFO by transcranial Dopplers, but the pseudoaneurysm is the most likely cause of thromboembolic events  For questions or updates, please contact [facility] Please consult [email or web address] for contact info under        Signed, Thurmon Fair, MD  10/08/2022, 9:57 AM

## 2022-10-08 NOTE — Progress Notes (Signed)
No ICM remote transmission received for 10/08/2022 due to current hospitalization and next ICM transmission scheduled for 10/15/2022.

## 2022-10-08 NOTE — Progress Notes (Signed)
Inpatient Rehab Admissions Coordinator:    CIR following but he does not have a clear disposition plan. Significant other cannot provide 24/7 support. I have attempted to reach Pt.'s son to discuss x3 but have been unable to. Pt. Is likely to need SNF.   Megan Salon, MS, CCC-SLP Rehab Admissions Coordinator  [phone] (celll) [phone] (office)

## 2022-10-09 ENCOUNTER — Inpatient Hospital Stay (HOSPITAL_COMMUNITY): Payer: Medicare PPO

## 2022-10-09 DIAGNOSIS — I63413 Cerebral infarction due to embolism of bilateral middle cerebral arteries: Secondary | ICD-10-CM | POA: Diagnosis not present

## 2022-10-09 DIAGNOSIS — I616 Nontraumatic intracerebral hemorrhage, multiple localized: Secondary | ICD-10-CM

## 2022-10-09 DIAGNOSIS — I5022 Chronic systolic (congestive) heart failure: Secondary | ICD-10-CM | POA: Diagnosis not present

## 2022-10-09 DIAGNOSIS — I63412 Cerebral infarction due to embolism of left middle cerebral artery: Secondary | ICD-10-CM

## 2022-10-09 DIAGNOSIS — I5042 Chronic combined systolic (congestive) and diastolic (congestive) heart failure: Secondary | ICD-10-CM

## 2022-10-09 DIAGNOSIS — I253 Aneurysm of heart: Secondary | ICD-10-CM | POA: Diagnosis not present

## 2022-10-09 DIAGNOSIS — I2510 Atherosclerotic heart disease of native coronary artery without angina pectoris: Secondary | ICD-10-CM | POA: Diagnosis not present

## 2022-10-09 LAB — URINALYSIS, ROUTINE W REFLEX MICROSCOPIC
Bacteria, UA: NONE SEEN
Bilirubin Urine: NEGATIVE
Glucose, UA: NEGATIVE mg/dL
Hgb urine dipstick: NEGATIVE
Ketones, ur: NEGATIVE mg/dL
Leukocytes,Ua: NEGATIVE
Nitrite: NEGATIVE
Protein, ur: 100 mg/dL — AB
Specific Gravity, Urine: 1.028 (ref 1.005–1.030)
pH: 5 (ref 5.0–8.0)

## 2022-10-09 LAB — BASIC METABOLIC PANEL
Anion gap: 13 (ref 5–15)
BUN: 18 mg/dL (ref 8–23)
CO2: 20 mmol/L — ABNORMAL LOW (ref 22–32)
Calcium: 8.9 mg/dL (ref 8.9–10.3)
Chloride: 106 mmol/L (ref 98–111)
Creatinine, Ser: 0.98 mg/dL (ref 0.61–1.24)
GFR, Estimated: >60 mL/min (ref >60)
Glucose, Bld: 114 mg/dL — ABNORMAL HIGH (ref 70–99)
Potassium: 4.2 mmol/L (ref 3.5–5.1)
Sodium: 139 mmol/L (ref 135–145)

## 2022-10-09 LAB — CK: Total CK: 610 U/L — ABNORMAL HIGH (ref 49–397)

## 2022-10-09 LAB — CBC
HCT: 40.5 % (ref 39.0–52.0)
Hemoglobin: 13.8 g/dL (ref 13.0–17.0)
MCH: 29.5 pg (ref 26.0–34.0)
MCHC: 34.1 g/dL (ref 30.0–36.0)
MCV: 86.5 fL (ref 80.0–100.0)
Platelets: 145 10*3/uL — ABNORMAL LOW (ref 150–400)
RBC: 4.68 MIL/uL (ref 4.22–5.81)
RDW: 12.8 % (ref 11.5–15.5)
WBC: 9.4 10*3/uL (ref 4.0–10.5)
nRBC: 0 % (ref 0.0–0.2)

## 2022-10-09 LAB — GLUCOSE, CAPILLARY
Glucose-Capillary: 109 mg/dL — ABNORMAL HIGH (ref 70–99)
Glucose-Capillary: 109 mg/dL — ABNORMAL HIGH (ref 70–99)
Glucose-Capillary: 115 mg/dL — ABNORMAL HIGH (ref 70–99)
Glucose-Capillary: 124 mg/dL — ABNORMAL HIGH (ref 70–99)
Glucose-Capillary: 124 mg/dL — ABNORMAL HIGH (ref 70–99)
Glucose-Capillary: 159 mg/dL — ABNORMAL HIGH (ref 70–99)

## 2022-10-09 LAB — HEPATIC FUNCTION PANEL
ALT: 24 U/L (ref 0–44)
AST: 36 U/L (ref 15–41)
Albumin: 3.1 g/dL — ABNORMAL LOW (ref 3.5–5.0)
Alkaline Phosphatase: 82 U/L (ref 38–126)
Bilirubin, Direct: 0.6 mg/dL — ABNORMAL HIGH (ref 0.0–0.2)
Indirect Bilirubin: 1.3 mg/dL — ABNORMAL HIGH (ref 0.3–0.9)
Total Bilirubin: 1.9 mg/dL — ABNORMAL HIGH (ref 0.3–1.2)
Total Protein: 6.5 g/dL (ref 6.5–8.1)

## 2022-10-09 LAB — MAGNESIUM: Magnesium: 2.2 mg/dL (ref 1.7–2.4)

## 2022-10-09 MED ORDER — POLYETHYLENE GLYCOL 3350 17 G PO PACK
17.0000 g | PACK | Freq: Two times a day (BID) | ORAL | Status: DC
  Administered 2022-10-09 – 2022-10-29 (×22): 17 g
  Filled 2022-10-09 (×29): qty 1

## 2022-10-09 MED ORDER — METOPROLOL TARTRATE 50 MG PO TABS
50.0000 mg | ORAL_TABLET | Freq: Two times a day (BID) | ORAL | Status: DC
  Administered 2022-10-09: 50 mg via ORAL
  Filled 2022-10-09: qty 1

## 2022-10-09 MED ORDER — SODIUM CHLORIDE 0.9 % IV SOLN
1.0000 g | INTRAVENOUS | Status: DC
  Administered 2022-10-09 – 2022-10-10 (×2): 1 g via INTRAVENOUS
  Filled 2022-10-09 (×3): qty 10

## 2022-10-09 MED ORDER — SODIUM CHLORIDE 0.9 % IV SOLN
1.0000 g | INTRAVENOUS | Status: DC
  Filled 2022-10-09: qty 10

## 2022-10-09 MED ORDER — METOPROLOL TARTRATE 5 MG/5ML IV SOLN
5.0000 mg | INTRAVENOUS | Status: DC | PRN
  Administered 2022-10-09: 5 mg via INTRAVENOUS
  Filled 2022-10-09: qty 5

## 2022-10-09 MED ORDER — OSMOLITE 1.5 CAL PO LIQD
1000.0000 mL | ORAL | Status: DC
  Administered 2022-10-09: 1000 mL

## 2022-10-09 MED ORDER — FUROSEMIDE 10 MG/ML IJ SOLN
40.0000 mg | Freq: Once | INTRAMUSCULAR | Status: AC
  Administered 2022-10-09: 40 mg via INTRAVENOUS
  Filled 2022-10-09: qty 4

## 2022-10-09 MED ORDER — SENNOSIDES-DOCUSATE SODIUM 8.6-50 MG PO TABS
1.0000 | ORAL_TABLET | Freq: Two times a day (BID) | ORAL | Status: DC
  Administered 2022-10-09 – 2022-10-12 (×4): 1
  Filled 2022-10-09 (×5): qty 1

## 2022-10-09 MED ORDER — IOHEXOL 350 MG/ML SOLN
95.0000 mL | Freq: Once | INTRAVENOUS | Status: AC | PRN
  Administered 2022-10-09: 95 mL via INTRAVENOUS

## 2022-10-09 MED ORDER — METOPROLOL TARTRATE 50 MG PO TABS
50.0000 mg | ORAL_TABLET | Freq: Two times a day (BID) | ORAL | Status: DC
  Administered 2022-10-09 – 2022-10-10 (×2): 50 mg
  Filled 2022-10-09 (×2): qty 1

## 2022-10-09 NOTE — Progress Notes (Signed)
Inpatient Rehab Admissions Coordinator:    Pt. Noted to be too lethargic to attempt OOB yesterday. Additionally I have been unable to confirm support at d/c despite multiple attempts to call and speak with his son.  CIR will sign off at this time. He will likely eventually need SNF.  Megan Salon, MS, CCC-SLP Rehab Admissions Coordinator  [phone] (celll) [phone] (office)

## 2022-10-09 NOTE — Progress Notes (Addendum)
STROKE TEAM PROGRESS NOTE   INTERVAL HISTORY Needs cardiac CT per cardiology but will need to tolerate beta blockers. Will initiate per cardiology today. Febrile with tmax 102- pan culture and CXR ordered. Hold off on transfer out of ICU for today, reassess tomorrow.  TTE today shows EF 20-25% with large inferior pseudoaneurysm, probable source of embolic infarcts.  Cardiology consulted, suspects dehiscence of previous pseudoaneurysm patch with new pseudoaneurysm, would want anticoagulation and repeat pseudoaneurysm repair once healthy enough. Unfortunately, patient is not candidate for needed anticoagulation for this at this time due to hemorrhages, would need to wait at least a week after hemorrhage occurred (6/26 or later). CT shows unchanged appearance of IPH and subarachnoid hemorrhage. Patient continues to have severe dysarthria, right facial droop left-sided weakness.   Vitals:   10/09/22 0500 10/09/22 0600 10/09/22 0700 10/09/22 0800  BP:   (!) 146/103 127/71  Pulse: 83 92 83 78  Resp: (!) 33 (!) 34 (!) 21 (!) 29  Temp:      TempSrc:      SpO2: 91% 97% 92% 95%  Weight: 108.3 kg     Height:       CBC:  Recent Labs  Lab 10/02/22 1228 10/02/22 1236 10/05/22 0746 10/07/22 0817  WBC 5.5   < > 8.3 7.6  NEUTROABS 2.1  --   --   --   HGB 16.7   < > 12.3* 12.9*  HCT 50.0   < > 37.2* 38.6*  MCV 88.0   < > 89.9 90.6  PLT 148*   < > 89* 119*   < > = values in this interval not displayed.    Basic Metabolic Panel:  Recent Labs  Lab 10/04/22 1355 10/04/22 1959 10/05/22 0746 10/06/22 0231 10/07/22 0817  NA 144   < > 146* 148* 144  K  --   --  3.8 3.9 4.2  CL  --   --  117* 117* 111  CO2  --   --  20* 22 22  GLUCOSE  --   --  147* 145* 179*  BUN  --   --  13 13 16   CREATININE  --   --  0.92 0.96 1.10  CALCIUM  --   --  8.1* 8.9 8.9  MG 2.2  --  2.1 2.1  --   PHOS 2.3*  --  2.5  --   --    < > = values in this interval not displayed.    Lipid Panel:  Recent Labs  Lab  10/03/22 0058  CHOL 124  TRIG 67  HDL 48  CHOLHDL 2.6  VLDL 13  LDLCALC 63    HgbA1c:  Recent Labs  Lab 10/03/22 0058  HGBA1C 5.8*    Urine Drug Screen:  Recent Labs  Lab 10/02/22 2140  LABOPIA NONE DETECTED  COCAINSCRNUR NONE DETECTED  LABBENZ NONE DETECTED  AMPHETMU NONE DETECTED  THCU NONE DETECTED  LABBARB NONE DETECTED     Alcohol Level  Recent Labs  Lab 10/02/22 1228  ETH <10     IMAGING past 24 hours DG CHEST PORT 1 VIEW  Result Date: 10/08/2022 CLINICAL DATA:  Respiratory difficulty EXAM: PORTABLE CHEST 1 VIEW COMPARISON:  Previous studies including the examination of 10/05/2022 FINDINGS: Transverse diameter of heart is increased. There are no signs of areolar pulmonary edema. Low lung volumes. Linear densities are seen in lower lung fields on both sides. There is no pleural effusion or pneumothorax. Pacemaker/defibrillator battery is seen  in the left infraclavicular region. Feeding tube is noted traversing the esophagus. IMPRESSION: Cardiomegaly. Increased markings are seen in both lower lung fields which may be related to low lung volumes or subsegmental atelectasis/pneumonia. Electronically Signed   By: Ernie Avena M.D.   On: 10/08/2022 12:45     Physical Exam  Constitutional: Appears well-developed and well-nourished.  Cardiovascular: Normal rate and regular rhythm.  Respiratory: Effort normal on RA GI: Soft.  No distension. There is no tenderness.  Skin: WDI  Neuro: Mental Status: Patient is awake, alert, speech is dysarthric. Unable to assess orientation  Follows simple commands Cranial Nerves: II: Pupils are equal, round, and reactive to light.   III,IV, VI: EOMI without ptosis or diplopia.  VII: Mild facial droop  VIII: Hearing is intact to voice X: Palate is midline and palate elevates symmetrically, phonation intact XII: tongue is midline without atrophy or fasciculations.  Motor: Tone is normal. Bulk is normal. Trace L sided  weakness. RLE 3/5. RUE decreased strength 2/5   ASSESSMENT/PLAN [name] is a 62 y.o. male with history of CAD s/p PCI w/ stent to LCS and LAD, s/p 1v CABG, multiple CVAs (R cerebellar 04/2022),  syscolic HF s/p ICD, HLD, HTN presenting with slurred speech and L sided weakness now s/p TNK and IR thrombectomy of L MCA (M3 segment) c/b  hemorrhage.   TIA - Right brain stroke aborted s/p TNK Stroke - Left MCA and right cerebellar infarcts left M3 occlusion s/p IR with TICIs, complicated by L frontoparietal ICH and SAH, etiology likely cardioembolic source  CT 6/18 no acute finding, but the bilateral frontal, parietal, occipital and cerebellum infarcts CTA head and neck unremarkable CTA head and neck repeat new short segment occlusion of distal left M2 S/p IR with left M3 TICIs reperfusion  post IR CT Hyperdensity within the left sylvian fissure may represent small subarachnoid hemorrhage versus contrast. CT Head: Progressed hemorrhage in L frontoparietal region w/ 3cm hematoma and regional SAH MRI  progressive L fronto parietal hematoma (~7cm), regional SAH, increased mass effect.  Likely underlying infarct. Sm R cerebellar infarct, acute. Numerous chornic cerebral and cerebellar infarcts Repeat CT 6/19 increase in size of the left frontal/parietal hematoma. New extension into the left lateral ventricle. CT repeat 6/22 x 2 - New component of intraparenchymal hemorrhage in the posterior right hemisphere that measures approximately 2.5 x 1.4 cm. (On my review, more like right brain redistribution instead of new hemorrhage)  Repeat CT 6/23: Unchanged appearance of bilateral mixed intraparenchymal and subarachnoid hemorrhage. Trace rightward midline shift is unchanged. No hydrocephalus CT repeat 6/25 pending Echo: Large inferior pseudoaneurysm with a depth of 3 cm and a maximum width of roughly 5 cm the wall of the pseudoaneurysm is made above the layer of thrombus that is roughly 1.5 cm  thick. EF 30 to 35%, severe hypokinesis of left ventricle. No afib with ICD interrogation LE venous doppler neg for DVT TCD bubble study showed moderate sized PFO LDL 63 HgbA1c 5.8 UDS neg VTE prophylaxis - SQH  aspirin 81 mg daily and clopidogrel 75 mg daily prior to admission, now on No antithrombotic due to hemorrhage. May need to consider [facility] in a week (6/26) given significant embolic risk.  Therapy recommendations:  CIR Disposition:  Cardiac Tele  CHF s/p ICD Systolic HF, s/p ICD 2/2 ICM.  Echo EF 30-35%  Trop 45 -> 43 -> 39 -> 35 CK 3714, CKMB 3.5 CXR 6/25 central vascular congestion and developing interstitial edema.  Lasix 40 x 1 Cardiology on board  Hypertension Home meds:  coreg 12.5mg  BID, entresto 97-103mg  BID, spironolactone 25mg  Stable now on metoprolol 50 bid Goal SBP < 160  Long-term BP goal normotensive  Large inferior cardiac pseudoaneurysm Previous inferior pseudoaneurysm repaired in 2014 Echo: Large inferior pseudoaneurysm with a depth of 3 cm and a maximum width of roughly 5 cm the wall of the pseudoaneurysm is made above the layer of thrombus that is roughly 1.5 cm thick.  Cardiology on board, appreciate assistance  CTA of the heart pending  Possibly need to start anticoagulation in 1 week post hemorrhage. Ultimately patient will need to repeat undergo repeat pseudoaneurysm repair  Hyperlipidemia Home meds:  atorvastatin 80 LDL 63, goal < 70 Hold off lipitor 80-> hold until CK is under 500    Continue statin at discharge  Tobacco abuse Current smoker Smoking cessation counseling will be provided Nicotine patch provided  Fever ? Aspiration pneumonia Tmax 101.6 -> 99.8-> afebrile->100.8 -> 102 CBC WNL - WBC 7.6->9.4 UA negative CXR central vascular congestion and developing interstitial edema.  Procalc negative.  Blood culture pending Ceftriaxone x5 days  Dysphagia  Vomiting on zofran PRN, improved Pt ate well 6/24, nocturnal TF d/c'ed ->  fever with possible aspiration -> NPO and TF restarted  PFO Known PFO in the past TCD bubble study showed a moderate-sized PFO LE venous Doppler negative for DVT  Not an emergent issue at this time  Other stroke risk factors Obesity with BMI 32.23 CAD  Other Active Problems Thrombocytopenia, platelet 110->89 CK 436 -> 3714 -> 610 Hold statin until CK is below 500  Hospital day # 7   Patient seen and examined by NP/APP with MD. MD to update note as needed.   Robert Picker, DNP, FNP-BC [facility] Pager: [phone]  ATTENDING NOTE: I reviewed above note and agree with the assessment and plan. Pt was seen and examined.   No family at the bedside. Pt lying in bed, seems to have more respiratory distress with [facility] stoke breathing pattern. Overnight Tmax 102, started on rocephin. CXR concerning for worsening pulmonary edema, put on lasix 40 x 1. BP improving, put on metoprolol and will check CTA heart today and repeat CT head. Hold off statin due to elevated CK. May consider AC based on CT repeat given high risk of emboli.  For detailed assessment and plan, please refer to above/below as I have made changes wherever appropriate.   Marvel Plan, MD PhD Stroke Neurology 10/09/2022 6:44 PM  This patient is critically ill due to bilateral stroke, status post TNK, significant send hemorrhagic transformation, CHF status post ICD, large heart pseudoaneurysm and at significant risk of neurological worsening, death form recurrent stroke, hemorrhagic transformation with cerebral edema, brain herniation, heart failure, sepsis, aspiration. This patient's care requires constant monitoring of vital signs, hemodynamics, respiratory and cardiac monitoring, review of multiple databases, neurological assessment, discussion with family, other specialists and medical decision making of high complexity. I spent 40 minutes of neurocritical care time in the care of this patient. I discussed  with Dr. Ernest Pine   To contact Stroke Continuity provider, please refer to [email or web address]. After hours, contact General Neurology

## 2022-10-09 NOTE — Progress Notes (Signed)
Occupational Therapy Treatment Patient Details Name: [name] MRN: [id number] DOB: February 19, 1961 Today's Date: 10/09/2022   History of present illness Patient is a 62 y/o male admitted 10/03/22 with L weakness and numbness.  Received TNK then developed now symptoms of aphasia and R weakness.  He underwent mechanical thrombectomy L M3 segment MCA noted to have small hemorrhage and follow up CT showed progression of hemorrhage 3cm then 7cm and small acute R cerebellar infarct. PMH positive for CAD s/p PCI w/stent and 1v CABG, multiple CGAs, systolic HF s/p ICD, HLD, HTN.   OT comments  Pt sleeping upon therapy arrival although became alert with verbal prompts. Co-tx completed with PT d/t increased level of assist needed to complete mobility and progress in therapy. Pt provided with yes/no questions to increase communication abilities. Pt with max difficulty following one step commands during session. Focused on basic cognition, sitting balance, and bed mobility during session. Discharge recommendation updated. Patient will benefit from continued inpatient follow up therapy, <3 hours/day. OT will continue to follow patient acutely.     Recommendations for follow up therapy are one component of a multi-disciplinary discharge planning process, led by the attending physician.  Recommendations may be updated based on patient status, additional functional criteria and insurance authorization.    Assistance Recommended at Discharge Frequent or constant Supervision/Assistance  Patient can return home with the following  Two people to help with walking and/or transfers;Two people to help with bathing/dressing/bathroom;Assistance with cooking/housework;Assistance with feeding;Help with stairs or ramp for entrance;Direct supervision/assist for financial management;Assist for transportation;Direct supervision/assist for medications management   Equipment Recommendations  Other (comment) (defer to next venue  of care)       Precautions / Restrictions Precautions Precautions: Fall Precaution Comments: R inattention; SBP <160 Restrictions Weight Bearing Restrictions: No       Mobility Bed Mobility Overal bed mobility: Needs Assistance Bed Mobility: Rolling, Sidelying to Sit, Sit to Supine Rolling: Max assist Sidelying to sit: Total assist, +2 for physical assistance, +2 for safety/equipment, HOB elevated Supine to sit: Total assist, +2 for physical assistance, +2 for safety/equipment (Footboard removed to return to bed)     General bed mobility comments: Hand over hand provided for total assist to reach and grasp onto bed rail when initiating roll. VC to bring legs off of bed when transitioning to sitting EOB. Pt did not follow verbal or tactile cues requiring total assist. When returning to bed, pt moved his right leg off EOB independently. Patient Response: Flat affect, Impulsive  Transfers Overall transfer level: Needs assistance Equipment used: 2 person hand held assist Transfers: Sit to/from Stand Sit to Stand: Total assist, +2 physical assistance, +2 safety/equipment, From elevated surface           General transfer comment: Pt nodded "yes" when asked if he'd like to stand. 2 person face to face technique used with VC provided to use legs, squeeze buttocks, and look up to stand. No LE muscle activation noted to assist to stand. Pt unable to achieve full standing posture. Completed 2 attempts.     Balance Overall balance assessment: Needs assistance Sitting-balance support: Bilateral upper extremity supported, Single extremity supported, Feet supported Sitting balance-Leahy Scale: Zero Sitting balance - Comments: Pt required intermittent max A to maintain sitting balance at EOB while able to hold sitting balance with min guard/min A. Pt restless while seated while frequently attempting to flex excessively forward at his hips or towards his right side requiring 2 person assist to  correct.  Postural control: Right lateral lean, Other (comment) (Anterior lean) Standing balance support: No upper extremity supported Standing balance-Leahy Scale: Zero Standing balance comment: Unable to achieve full erect standing posture. Heavily reliant on therapists for balance.          ADL either performed or assessed with clinical judgement   ADL       Grooming: Wash/dry face;Cueing for sequencing;Total assistance;Sitting Grooming Details (indicate cue type and reason): Pt did place washcloth on his head. Did not attempt to wash face when cued.     Extremity/Trunk Assessment Upper Extremity Assessment RUE Deficits / Details: No A/ROM when cued during session. Washcloth placed in right hand although did not initiate washing his face or holding onto washcloth when asked. LUE Deficits / Details: Use his LUE primarily during session         Vision   Additional Comments: Difficulty with visual tracking and gazing at appropriate therapist or object when appropriate.          Cognition Arousal/Alertness: Awake/alert Behavior During Therapy: Flat affect, Impulsive Overall Cognitive Status: Difficult to assess Area of Impairment: Attention, Safety/judgement, Awareness, Following commands        Following Commands: Follows one step commands inconsistently, Follows one step commands with increased time Safety/Judgement: Decreased awareness of safety, Decreased awareness of deficits     General Comments: Difficulty with attention. struggled with being restless while seated on EOB. Seemed to be bothered by his left hip. Repositioned catheter although showed no improvement. Yes/no questions used to increase communication with head nods.              General Comments BP stable during session. SKin temperature felt hot. Pt has experienced a fever today per nursing.    Pertinent Vitals/ Pain       Pain Assessment Pain Assessment: Faces Faces Pain Scale: Hurts little  more Pain Location: when rolling from either side back supine. When asked, pt confirmed that his right hip was sore. Pain Descriptors / Indicators: Grimacing, Discomfort Pain Intervention(s): Monitored during session, Repositioned         Frequency  Min 2X/week        Progress Toward Goals  OT Goals(current goals can now be found in the care plan section)  Progress towards OT goals: Not progressing toward goals - comment (pt with increased fatigue and required more physical assist from therapy versus previous sessions. Discharge plan updated)     Plan Discharge plan needs to be updated;Frequency remains appropriate    Co-evaluation    PT/OT/SLP Co-Evaluation/Treatment: Yes     OT goals addressed during session: ADL's and self-care;Strengthening/ROM      AM-PAC OT "6 Clicks" Daily Activity     Outcome Measure   Help from another person eating meals?: Total Help from another person taking care of personal grooming?: Total Help from another person toileting, which includes using toliet, bedpan, or urinal?: Total Help from another person bathing (including washing, rinsing, drying)?: Total Help from another person to put on and taking off regular upper body clothing?: Total Help from another person to put on and taking off regular lower body clothing?: Total 6 Click Score: 6    End of Session Equipment Utilized During Treatment: Gait belt  OT Visit Diagnosis: Unsteadiness on feet (R26.81);Other abnormalities of gait and mobility (R26.89);Muscle weakness (generalized) (M62.81);Cognitive communication deficit (R41.841);Hemiplegia and hemiparesis Hemiplegia - Right/Left: Right Hemiplegia - dominant/non-dominant: Dominant   Activity Tolerance Patient limited by fatigue   Patient Left in bed;with call bell/phone  within reach   Nurse Communication Mobility status        Time: 8295-6213 OT Time Calculation (min): 23 min  Charges: OT General Charges $OT Visit: 1  Visit OT Treatments $Therapeutic Activity: 8-22 mins  Limmie Patricia, OTR/L,CBIS  Supplemental OT - [facility] and [facility] Secure Chat Preferred    Hoyt Leanos, Charisse March 10/09/2022, 3:11 PM

## 2022-10-09 NOTE — Progress Notes (Signed)
Physical Therapy Treatment Patient Details Name: [name] MRN: [id number] DOB: 08-Oct-1960 Today's Date: 10/09/2022   History of Present Illness Patient is a 62 y/o male admitted 10/03/22 with L weakness and numbness.  Received TNK then developed now symptoms of aphasia and R weakness.  He underwent mechanical thrombectomy L M3 segment MCA noted to have small hemorrhage and follow up CT showed progression of hemorrhage 3cm then 7cm and small acute R cerebellar infarct. PMH positive for CAD s/p PCI w/stent and 1v CABG, multiple CGAs, systolic HF s/p ICD, HLD, HTN.    PT Comments    Patient with very limited progress.  Note he has had fever today per RN.  Patient with more difficulty following commands, pushing forward and to R more and unable to fully stand despite two trials and heavy +2 A.  Patient seemed uncomfortable sitting like he was in pain and did indicated R hip pain after return to supine.  His vitals were stable throughout.  PT will continue to follow in acute setting.  Feel best d/c plan for inpatient rehab if family able to assist.  Noted CIR waiting to speak with son.    Recommendations for follow up therapy are one component of a multi-disciplinary discharge planning process, led by the attending physician.  Recommendations may be updated based on patient status, additional functional criteria and insurance authorization.  Follow Up Recommendations       Assistance Recommended at Discharge Frequent or constant Supervision/Assistance  Patient can return home with the following Two people to help with walking and/or transfers;A lot of help with bathing/dressing/bathroom;Assistance with cooking/housework;Direct supervision/assist for medications management;Assist for transportation;Help with stairs or ramp for entrance;Assistance with feeding   Equipment Recommendations  Other (comment) (TBA)    Recommendations for Other Services       Precautions / Restrictions  Precautions Precautions: Fall Precaution Comments: R inattention; SBP <160 Restrictions Weight Bearing Restrictions: No     Mobility  Bed Mobility Overal bed mobility: Needs Assistance Bed Mobility: Rolling, Sidelying to Sit, Sit to Supine Rolling: Max assist Sidelying to sit: Total assist, +2 for physical assistance, +2 for safety/equipment, HOB elevated Supine to sit: Total assist, +2 for physical assistance, +2 for safety/equipment     General bed mobility comments: Hand over hand provided for total assist to reach and grasp onto bed rail when initiating roll. VC to bring legs off of bed when transitioning to sitting EOB. Pt did not follow verbal or tactile cues requiring total assist. When returning to bed, pt moved his right leg off EOB independently.    Transfers Overall transfer level: Needs assistance Equipment used: 2 person hand held assist Transfers: Sit to/from Stand Sit to Stand: Total assist, +2 physical assistance, +2 safety/equipment, From elevated surface           General transfer comment: Pt nodded "yes" when asked if he'd like to stand. 2 person face to face technique used with VC provided to use legs, squeeze buttocks, and look up to stand. No LE muscle activation noted to assist to stand. Pt unable to achieve full standing posture. Completed 2 attempts.    Ambulation/Gait                   Stairs             Wheelchair Mobility    Modified Rankin (Stroke Patients Only) Modified Rankin (Stroke Patients Only) Pre-Morbid Rankin Score: Moderate disability Modified Rankin: Severe disability     Balance  Overall balance assessment: Needs assistance Sitting-balance support: Bilateral upper extremity supported, Single extremity supported, Feet supported Sitting balance-Leahy Scale: Zero Sitting balance - Comments: Pt required intermittent max A to maintain sitting balance at EOB while able to hold sitting balance with min guard/min A. Pt  restless while seated while frequently attempting to flex excessively forward at his hips or towards his right side requiring 2 person assist to correct.   Standing balance support: Bilateral upper extremity supported Standing balance-Leahy Scale: Zero Standing balance comment: Unable to achieve full erect standing posture. Heavily reliant on therapists for balance.                            Cognition Arousal/Alertness: Awake/alert Behavior During Therapy: Flat affect, Impulsive Overall Cognitive Status: Difficult to assess Area of Impairment: Attention, Safety/judgement, Awareness, Following commands                   Current Attention Level: Focused   Following Commands: Follows one step commands inconsistently, Follows one step commands with increased time Safety/Judgement: Decreased awareness of safety, Decreased awareness of deficits     General Comments: Difficulty with attention. struggled with being restless while seated on EOB. Seemed to be bothered by his left hip. Repositioned catheter although showed no improvement. Yes/no questions used to increase communication with head nods.        Exercises      General Comments General comments (skin integrity, edema, etc.): BP stable during session, Noted increased RR and pt diaphoretic (RN reported having fever today)      Pertinent Vitals/Pain Pain Assessment Pain Assessment: Faces Faces Pain Scale: Hurts little more Pain Location: when rolling from either side back supine. When asked, pt confirmed that his right hip was sore. Pain Descriptors / Indicators: Grimacing, Discomfort Pain Intervention(s): Monitored during session, Repositioned    Home Living                          Prior Function            PT Goals (current goals can now be found in the care plan section) Progress towards PT goals: Not progressing toward goals - comment    Frequency    Min 4X/week      PT Plan  Current plan remains appropriate    Co-evaluation PT/OT/SLP Co-Evaluation/Treatment: Yes Reason for Co-Treatment: Complexity of the patient's impairments (multi-system involvement);For patient/therapist safety PT goals addressed during session: Mobility/safety with mobility;Balance OT goals addressed during session: ADL's and self-care;Strengthening/ROM      AM-PAC PT "6 Clicks" Mobility   Outcome Measure  Help needed turning from your back to your side while in a flat bed without using bedrails?: Total Help needed moving from lying on your back to sitting on the side of a flat bed without using bedrails?: Total Help needed moving to and from a bed to a chair (including a wheelchair)?: Total Help needed standing up from a chair using your arms (e.g., wheelchair or bedside chair)?: Total Help needed to walk in hospital room?: Total Help needed climbing 3-5 steps with a railing? : Total 6 Click Score: 6    End of Session Equipment Utilized During Treatment: Gait belt Activity Tolerance: Patient limited by fatigue Patient left: in bed;with call bell/phone within reach;with bed alarm set   PT Visit Diagnosis: Other abnormalities of gait and mobility (R26.89);Other symptoms and signs involving the nervous system (R29.898);Hemiplegia  and hemiparesis Hemiplegia - Right/Left: Right Hemiplegia - dominant/non-dominant: Dominant Hemiplegia - caused by: Cerebral infarction;Other Nontraumatic intracranial hemorrhage     Time: 3664-4034 PT Time Calculation (min) (ACUTE ONLY): 23 min  Charges:  $Therapeutic Activity: 8-22 mins                     Sheran Lawless, PT Acute Rehabilitation Services Office:[phone] 10/09/2022    Elray Mcgregor 10/09/2022, 6:27 PM

## 2022-10-09 NOTE — Progress Notes (Signed)
Rounding Note    Patient Name: [name] Date of Encounter: 10/09/2022  [facility] Cardiologist: Peter Swazil[place], MD   Subjective   Little change on focal neuro exam, but more sleepy. Low grade fever. BP higher. CXR pending. Plan for repeat head CT today.  Inpatient Medications    Scheduled Meds:  atorvastatin  80 mg Per Tube Daily   Chlorhexidine Gluconate Cloth  6 each Topical Daily   gabapentin  300 mg Per Tube QHS   heparin injection (subcutaneous)  5,000 Units Subcutaneous Q8H   multivitamin with minerals  1 tablet Per Tube Daily   nicotine  7 mg Transdermal Daily   pantoprazole (PROTONIX) IV  40 mg Intravenous QHS   polyethylene glycol  17 g Oral BID   senna-docusate  1 tablet Oral BID   Continuous Infusions:  cefTRIAXone (ROCEPHIN)  IV     PRN Meds: acetaminophen **OR** acetaminophen (TYLENOL) oral liquid 160 mg/5 mL **OR** acetaminophen, ondansetron (ZOFRAN) IV, mouth rinse, senna-docusate   Vital Signs    Vitals:   10/09/22 0600 10/09/22 0700 10/09/22 0800 10/09/22 1000  BP:  (!) 146/103 127/71 (!) 153/98  Pulse: 92 83 78 88  Resp: (!) 34 (!) 21 (!) 29 18  Temp:      TempSrc:      SpO2: 97% 92% 95% 93%  Weight:      Height:        Intake/Output Summary (Last 24 hours) at 10/09/2022 1056 Last data filed at 10/09/2022 0800 Gross per 24 hour  Intake 1855 ml  Output 1200 ml  Net 655 ml      10/09/2022    5:00 AM 10/08/2022    5:00 AM 10/07/2022    3:28 AM  Last 3 Weights  Weight (lbs) 238 lb 12.1 oz 242 lb 4.6 oz 242 lb 8.1 oz  Weight (kg) 108.3 kg 109.9 kg 110 kg      Telemetry    NSR - Personally Reviewed  ECG    No new tracing - Personally Reviewed  Physical Exam  Sleepier GEN: No acute distress.   Neck: No JVD Cardiac: RRR, no murmurs, rubs, or gallops.  Respiratory: Clear to auscultation bilaterally. GI: Soft, nontender, non-distended  MS: No edema; No deformity. Neuro:  R hemiparesis  Psych: Normal affect    Labs    High Sensitivity Troponin:   Recent Labs  Lab 10/06/22 1032 10/06/22 1257 10/06/22 1453 10/06/22 1748  TROPONINIHS 45* 43* 39* 35*     Chemistry Recent Labs  Lab 10/02/22 1228 10/02/22 1236 10/04/22 1355 10/04/22 1959 10/05/22 0746 10/06/22 0231 10/07/22 0817  NA 139   < > 144   < > 146* 148* 144  K 4.0   < >  --   --  3.8 3.9 4.2  CL 101   < >  --   --  117* 117* 111  CO2 23   < >  --   --  20* 22 22  GLUCOSE 81   < >  --   --  147* 145* 179*  BUN 9   < >  --   --  13 13 16   CREATININE 1.10   < >  --   --  0.92 0.96 1.10  CALCIUM 9.4   < >  --   --  8.1* 8.9 8.9  MG  --    < > 2.2  --  2.1 2.1  --   PROT 7.0  --   --   --   --   --   --  ALBUMIN 4.0  --   --   --   --   --   --   AST 19  --   --   --   --   --   --   ALT 16  --   --   --   --   --   --   ALKPHOS 117  --   --   --   --   --   --   BILITOT 0.6  --   --   --   --   --   --   GFRNONAA >60   < >  --   --  >60 >60 >60  ANIONGAP 15   < >  --   --  9 9 11    < > = values in this interval not displayed.    Lipids  Recent Labs  Lab 10/03/22 0058  CHOL 124  TRIG 67  HDL 48  LDLCALC 63  CHOLHDL 2.6    Hematology Recent Labs  Lab 10/04/22 0525 10/05/22 0746 10/07/22 0817  WBC 9.4 8.3 7.6  RBC 4.67 4.14* 4.26  HGB 13.7 12.3* 12.9*  HCT 41.7 37.2* 38.6*  MCV 89.3 89.9 90.6  MCH 29.3 29.7 30.3  MCHC 32.9 33.1 33.4  RDW 13.2 13.4 13.3  PLT 110* 89* 119*   Thyroid No results for input(s): "TSH", "FREET4" in the last 168 hours.  BNPNo results for input(s): "BNP", "PROBNP" in the last 168 hours.  DDimer No results for input(s): "DDIMER" in the last 168 hours.   Radiology    DG CHEST PORT 1 VIEW  Result Date: 10/08/2022 CLINICAL DATA:  Respiratory difficulty EXAM: PORTABLE CHEST 1 VIEW COMPARISON:  Previous studies including the examination of 10/05/2022 FINDINGS: Transverse diameter of heart is increased. There are no signs of areolar pulmonary edema. Low lung volumes. Linear  densities are seen in lower lung fields on both sides. There is no pleural effusion or pneumothorax. Pacemaker/defibrillator battery is seen in the left infraclavicular region. Feeding tube is noted traversing the esophagus. IMPRESSION: Cardiomegaly. Increased markings are seen in both lower lung fields which may be related to low lung volumes or subsegmental atelectasis/pneumonia. Electronically Signed   By: Ernie Avena M.D.   On: 10/08/2022 12:45    Cardiac Studies   Echocardiogram 10/07/2022   1. There is a large inferior pseudoaneurysm with a depth of 3 cm and a maximum width of roughly 5 cm. The "neck" at the level of the inferior wall measures 2.3 cm. The wall of the pseudoaneurysm is made up of a layer of thrombus that is roughly 1.5 cm thick. No mobile thrombus is seen.. Left ventricular ejection fraction, by estimation, is 30 to 35%. The left ventricle has moderately decreased function. The left ventricle demonstrates regional wall motion abnormalities (see scoring diagram/findings for description). The left ventricular internal cavity size was mildly dilated. There is akinesis of the left ventricular, basal-mid inferior wall and inferolateral wall. There is severe hypokinesis of the left ventricular, basal-mid inferoseptal wall. There is severe hypokinesis of the left ventricular, basal-mid lateral wall. There is peudoaneurysm of the left ventricular, basal inferior wall. Conclusion(s)/Recommendation(s): Findings are highly concerning for redevelopment of pseudoaneurysm of the inferior wall that was repaired in 2014. There appears to be dehiscence of the patch used to correct abnormality, with development of new pseudoaneurysm. No protruding or mobile thrombi are seen in the pseudoaneurysm, but a thick layer of thrombus makes up the external  wall of the pseudoaneurysm. Redcommend CT angiography or MR angiography of the heart for precise definition.  Patient Profile     62 y.o. male with  longstanding of history of CAD,s/p CABG and inferior wall pseudoaneurysm patch 2014, presents with acute embolic stroke and severe right hemiparesis/dysarthria complicated by hemorrhagic transformation of acute stroke as well as areas of multiple old strokes, all of which have embolic appearance.  Echocardiogram is concerning for dehiscence of the pseudoaneurysm patch, with broad communication between the left ventricular cavity and a thrombus filled pseudoaneurysm.   Assessment & Plan    LV pseudoaneurysm: Would like to further evaluate the left ventricular pseudoaneurysm with a CT angiogram.  BP is higher and will allow beta blockers to get a slower HR, necessary for gated imaging.    Will require initiation of oral anticoagulants, when it is felt to be safe from a neurological point of view.  Once on anticoagulation will not restart antiplatelet. HFrEF: Wait for CXR today. Net positive 6.5 liters since admission, but weight seems unchanged. May need diuretics? Moderately depressed left ventricular systolic function there is no clinical evidence of heart failure at this time.  Has an Abbott ICD in place.  Might be able to use this to help judge fluid status via thoracic impedance (he was enrolled in the heart failure device clinic as an outpatient).  On carvedilol, Farxiga, Entresto, spironolactone chronically, all of these are currently on hold. CAD: No evidence for recent acute coronary syndrome.  On aspirin and high-dose statin. Embolic stroke: Presented with a right brain stroke aborted with TNK, but then had a left MCA and right cerebellar acute infarct treated with thrombectomy, but complicated by left frontoparietal intracranial hemorrhage and subarachnoid hemorrhage.  Has right hemiparesis, inability to speak, unable to swallow.  Receiving speech therapy.  He also happens to have a moderate-sized PFO by transcranial Dopplers, but the pseudoaneurysm is the most likely cause of thromboembolic events      For questions or updates, please contact [facility] Please consult [email or web address] for contact info under        Signed, Thurmon Fair, MD  10/09/2022, 10:56 AM

## 2022-10-10 ENCOUNTER — Inpatient Hospital Stay (HOSPITAL_COMMUNITY): Payer: Medicare PPO

## 2022-10-10 DIAGNOSIS — I253 Aneurysm of heart: Secondary | ICD-10-CM | POA: Diagnosis not present

## 2022-10-10 DIAGNOSIS — I5043 Acute on chronic combined systolic (congestive) and diastolic (congestive) heart failure: Secondary | ICD-10-CM

## 2022-10-10 DIAGNOSIS — I63413 Cerebral infarction due to embolism of bilateral middle cerebral arteries: Secondary | ICD-10-CM | POA: Diagnosis not present

## 2022-10-10 DIAGNOSIS — I63412 Cerebral infarction due to embolism of left middle cerebral artery: Secondary | ICD-10-CM | POA: Diagnosis not present

## 2022-10-10 DIAGNOSIS — I25810 Atherosclerosis of coronary artery bypass graft(s) without angina pectoris: Secondary | ICD-10-CM | POA: Diagnosis not present

## 2022-10-10 DIAGNOSIS — I255 Ischemic cardiomyopathy: Secondary | ICD-10-CM

## 2022-10-10 LAB — BLOOD CULTURE ID PANEL (REFLEXED) - BCID2

## 2022-10-10 LAB — BASIC METABOLIC PANEL
Anion gap: 11 (ref 5–15)
BUN: 21 mg/dL (ref 8–23)
CO2: 23 mmol/L (ref 22–32)
Calcium: 8.5 mg/dL — ABNORMAL LOW (ref 8.9–10.3)
Chloride: 101 mmol/L (ref 98–111)
Creatinine, Ser: 0.97 mg/dL (ref 0.61–1.24)
GFR, Estimated: >60 mL/min (ref >60)
Glucose, Bld: 161 mg/dL — ABNORMAL HIGH (ref 70–99)
Potassium: 3.7 mmol/L (ref 3.5–5.1)
Sodium: 135 mmol/L (ref 135–145)

## 2022-10-10 LAB — CBC
HCT: 38.5 % — ABNORMAL LOW (ref 39.0–52.0)
Hemoglobin: 13.3 g/dL (ref 13.0–17.0)
MCH: 29.2 pg (ref 26.0–34.0)
MCHC: 34.5 g/dL (ref 30.0–36.0)
MCV: 84.6 fL (ref 80.0–100.0)
Platelets: 172 10*3/uL (ref 150–400)
RBC: 4.55 MIL/uL (ref 4.22–5.81)
RDW: 12.7 % (ref 11.5–15.5)
WBC: 10.2 10*3/uL (ref 4.0–10.5)
nRBC: 0 % (ref 0.0–0.2)

## 2022-10-10 LAB — GLUCOSE, CAPILLARY
Glucose-Capillary: 133 mg/dL — ABNORMAL HIGH (ref 70–99)
Glucose-Capillary: 135 mg/dL — ABNORMAL HIGH (ref 70–99)
Glucose-Capillary: 149 mg/dL — ABNORMAL HIGH (ref 70–99)
Glucose-Capillary: 161 mg/dL — ABNORMAL HIGH (ref 70–99)
Glucose-Capillary: 173 mg/dL — ABNORMAL HIGH (ref 70–99)
Glucose-Capillary: 177 mg/dL — ABNORMAL HIGH (ref 70–99)

## 2022-10-10 LAB — CK: Total CK: 490 U/L — ABNORMAL HIGH (ref 49–397)

## 2022-10-10 LAB — CULTURE, BLOOD (ROUTINE X 2)

## 2022-10-10 MED ORDER — SODIUM CHLORIDE 0.9 % IV SOLN: 2.0000 g | INTRAVENOUS | Status: DC

## 2022-10-10 MED ORDER — SODIUM CHLORIDE 0.9 % IV SOLN
1.0000 g | Freq: Once | INTRAVENOUS | Status: AC
  Administered 2022-10-10: 1 g via INTRAVENOUS
  Filled 2022-10-10: qty 10

## 2022-10-10 MED ORDER — SODIUM CHLORIDE 0.9 % IV SOLN
1.0000 g | Freq: Once | INTRAVENOUS | Status: DC
  Filled 2022-10-10: qty 10

## 2022-10-10 MED ORDER — PROSOURCE TF20 ENFIT COMPATIBL EN LIQD
60.0000 mL | Freq: Every day | ENTERAL | Status: DC
  Administered 2022-10-10 – 2022-10-23 (×14): 60 mL
  Filled 2022-10-10 (×14): qty 60

## 2022-10-10 MED ORDER — INSULIN ASPART 100 UNIT/ML IJ SOLN
0.0000 [IU] | INTRAMUSCULAR | Status: DC
  Administered 2022-10-10: 1 [IU] via SUBCUTANEOUS
  Administered 2022-10-10: 2 [IU] via SUBCUTANEOUS
  Administered 2022-10-11 (×3): 1 [IU] via SUBCUTANEOUS
  Administered 2022-10-11 (×3): 2 [IU] via SUBCUTANEOUS
  Administered 2022-10-12: 3 [IU] via SUBCUTANEOUS
  Administered 2022-10-12 (×2): 1 [IU] via SUBCUTANEOUS
  Administered 2022-10-12 – 2022-10-13 (×5): 2 [IU] via SUBCUTANEOUS
  Administered 2022-10-13: 1 [IU] via SUBCUTANEOUS
  Administered 2022-10-13: 5 [IU] via SUBCUTANEOUS
  Administered 2022-10-13 – 2022-10-14 (×3): 2 [IU] via SUBCUTANEOUS
  Administered 2022-10-14: 5 [IU] via SUBCUTANEOUS
  Administered 2022-10-14: 2 [IU] via SUBCUTANEOUS
  Administered 2022-10-14: 1 [IU] via SUBCUTANEOUS
  Administered 2022-10-14: 2 [IU] via SUBCUTANEOUS
  Administered 2022-10-14: 3 [IU] via SUBCUTANEOUS
  Administered 2022-10-14 – 2022-10-15 (×3): 2 [IU] via SUBCUTANEOUS

## 2022-10-10 MED ORDER — CARVEDILOL 12.5 MG PO TABS
12.5000 mg | ORAL_TABLET | Freq: Two times a day (BID) | ORAL | Status: DC
  Administered 2022-10-10 – 2022-10-11 (×2): 12.5 mg via ORAL
  Filled 2022-10-10 (×2): qty 1

## 2022-10-10 MED ORDER — FUROSEMIDE 10 MG/ML IJ SOLN
40.0000 mg | Freq: Once | INTRAMUSCULAR | Status: AC
  Administered 2022-10-10: 40 mg via INTRAVENOUS
  Filled 2022-10-10: qty 4

## 2022-10-10 MED ORDER — OSMOLITE 1.5 CAL PO LIQD
1000.0000 mL | ORAL | Status: DC
  Administered 2022-10-11 – 2022-10-22 (×15): 1000 mL
  Filled 2022-10-10 (×7): qty 1000

## 2022-10-10 MED ORDER — ATORVASTATIN CALCIUM 80 MG PO TABS
80.0000 mg | ORAL_TABLET | Freq: Every day | ORAL | Status: DC
  Administered 2022-10-10 – 2022-10-30 (×20): 80 mg
  Filled 2022-10-10 (×20): qty 1

## 2022-10-10 NOTE — TOC Initial Note (Signed)
Transition of Care [facility]) - Initial/Assessment Note    Patient Details  Name: [name] MRN: [id number] Date of Birth: Jan 05, 1961  Transition of Care [facility]) CM/SW Contact:    Mearl Latin, LCSW Phone Number: 10/10/2022, 2:43 PM  Clinical Narrative:                 CSW following for medical readiness for SNF placement once cortrak is removed.   Expected Discharge Plan: Skilled Nursing Facility Barriers to Discharge: Continued Medical Work up, English as a second language teacher, SNF Pending bed offer   Patient Goals and CMS Choice            Expected Discharge Plan and Services In-house Referral: Clinical Social Work     Living arrangements for the past 2 months: Apartment                                      Prior Living Arrangements/Services Living arrangements for the past 2 months: Apartment Lives with:: Significant Other Patient language and need for interpreter reviewed:: Yes Do you feel safe going back to the place where you live?: Yes      Need for Family Participation in Patient Care: Yes (Comment) Care giver support system in place?: No (comment)   Criminal Activity/Legal Involvement Pertinent to Current Situation/Hospitalization: No - Comment as needed  Activities of Daily Living      Permission Sought/Granted Permission sought to share information with : Facility Medical sales representative, Family Supports                Emotional Assessment Appearance:: Appears stated age Attitude/Demeanor/Rapport: Unable to Assess Affect (typically observed): Unable to Assess Orientation: :  (Follows commands) Alcohol / Substance Use: Not Applicable Psych Involvement: No (comment)  Admission diagnosis:  Stroke (cerebrum) (HCC) [I63.9] Patient Active Problem List   Diagnosis Date Noted   Stroke (cerebrum) (HCC) 10/02/2022   Cerebellar stroke (HCC) 08/06/2022   Cryptogenic stroke (HCC) 08/06/2022   At risk for obstructive sleep apnea 08/06/2022   Tobacco  use disorder 08/06/2022   Abnormality of gait due to impairment of balance 08/06/2022   TIA (transient ischemic attack) 05/04/2022   Chronic systolic CHF (congestive heart failure) (HCC) 05/04/2022   Expressive dysphasia 05/04/2022   Unsteady gait 05/04/2022   Near syncope 05/04/2022   Chronic systolic heart failure (HCC) 12/01/2021   ICD (implantable cardioverter-defibrillator) in place 12/01/2021   Rectal bleed 03/06/2021   Unstable angina (HCC)    Hx of CABG 07/09/2019   Lumbar radiculopathy 12/04/2018   Glaucoma 12/04/2018   Cortical age-related cataract of both eyes 12/04/2018   Pain management contract agreement 12/04/2018   Lumbar herniated disc 06/04/2018   Prediabetes 01/01/2018   Hyperlipidemia 12/30/2017   Obesity (BMI 30.0-34.9) 12/30/2017   Benign neoplasm of descending colon    Essential hypertension 11/03/2015   Cardiomyopathy, ischemic 06/03/2015   Chronic systolic dysfunction of left ventricle 06/03/2015   Erectile dysfunction due to arterial insufficiency 12/23/2014   Lipoma of skin and subcutaneous tissue of neck 05/24/2014   History of cardioembolic stroke 11/02/2013   Vision loss of right eye 06/23/2013   Dyslipidemia 06/22/2013   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[facility])    Coronary artery disease    Cardiomyopathy (HCC)    Pseudoaneurysm of left ventricle of heart    Tobacco abuse    PCP:  [name], MD Pharmacy:   [facility] [phone] - [place], [place] - [address] AT [facility] [address] [place] [address] Phone: [phone] Fax: [phone]  [facility] - [address] [place] - [address] [place] [address] Phone: [phone] Fax: [phone]  [facility] [address] [place] [address] Phone: [phone] Fax: [phone]     Social Determinants of Health (SDOH) Social History: SDOH Screenings   Depression  (PHQ2-9): Low Risk  (05/08/2022)  Tobacco Use: High Risk (10/04/2022)   SDOH Interventions:     Readmission Risk Interventions     No data to display

## 2022-10-10 NOTE — Plan of Care (Signed)
Called by Pharmacy who was notified about a lab input error for one of the positive blood cultures. The culture that was labeled positive for gram negative rods is actually positive for Staph Epidermidis, making the patient positive on 2/2 blood samples for this pathogen, rather than 1/2. This is therefore most likely not a contaminant and ceftriaxone dose is being increased to 2 g IV q24h.   Appreciate Pharmacy assistance.   Electronically signed: Dr. Caryl Pina

## 2022-10-10 NOTE — Progress Notes (Signed)
Rounding Note    Patient Name: [name] Date of Encounter: 10/10/2022  [facility] Cardiologist: Peter Swaz[place], MD   Subjective   More alert. Still unable to speak, but answers questions with movements of head. - CT head  - no change in ICH. - CT heart - poor quality study due to motion. Confirms thrombus in the LV cavity, attached to the patch, but did not clearly show disruption of the patch. - CXR most consistent w CHF, no clear pneumonia. Good UO w diuretics. BP remains relatively high. Renal function stable. Still febrile.  Inpatient Medications    Scheduled Meds:  Chlorhexidine Gluconate Cloth  6 each Topical Daily   gabapentin  300 mg Per Tube QHS   heparin injection (subcutaneous)  5,000 Units Subcutaneous Q8H   metoprolol tartrate  50 mg Per Tube BID   multivitamin with minerals  1 tablet Per Tube Daily   nicotine  7 mg Transdermal Daily   pantoprazole (PROTONIX) IV  40 mg Intravenous QHS   polyethylene glycol  17 g Per Tube BID   senna-docusate  1 tablet Per Tube BID   Continuous Infusions:  cefTRIAXone (ROCEPHIN)  IV Stopped (10/09/22 1823)   feeding supplement (OSMOLITE 1.5 CAL) 50 mL/hr at 10/10/22 0600   PRN Meds: acetaminophen **OR** acetaminophen (TYLENOL) oral liquid 160 mg/5 mL **OR** acetaminophen, metoprolol tartrate, ondansetron (ZOFRAN) IV, mouth rinse, senna-docusate   Vital Signs    Vitals:   10/10/22 0603 10/10/22 0700 10/10/22 0800 10/10/22 1100  BP:  133/89 (!) 133/58 (!) 137/98  Pulse: 79 77 86 76  Resp: (!) 35 16 (!) 33 (!) 36  Temp:   97.7 F (36.5 C) 99.8 F (37.7 C)  TempSrc:   Axillary   SpO2: 97% 96% 96% 98%  Weight:      Height:        Intake/Output Summary (Last 24 hours) at 10/10/2022 1138 Last data filed at 10/10/2022 0600 Gross per 24 hour  Intake 625.89 ml  Output 2275 ml  Net -1649.11 ml      10/10/2022    5:00 AM 10/09/2022    5:00 AM 10/08/2022    5:00 AM  Last 3 Weights  Weight (lbs) 237 lb 7  oz 238 lb 12.1 oz 242 lb 4.6 oz  Weight (kg) 107.7 kg 108.3 kg 109.9 kg      Telemetry    NSR - Personally Reviewed  ECG    No new tracing - Personally Reviewed  Physical Exam  Obese, dysarthric GEN: No acute distress.   Neck: No JVD Cardiac: RRR, no murmurs, rubs, or gallops.  Respiratory: Clear to auscultation bilaterally. GI: Soft, nontender, non-distended  MS: No edema; No deformity. Neuro:  R hemiparesis Psych: Normal affect   Labs    High Sensitivity Troponin:   Recent Labs  Lab 10/06/22 1032 10/06/22 1257 10/06/22 1453 10/06/22 1748  TROPONINIHS 45* 43* 39* 35*     Chemistry Recent Labs  Lab 10/05/22 0746 10/06/22 0231 10/07/22 0817 10/09/22 1018 10/09/22 1751 10/10/22 0455  NA 146* 148* 144 139  --  135  K 3.8 3.9 4.2 4.2  --  3.7  CL 117* 117* 111 106  --  101  CO2 20* 22 22 20*  --  23  GLUCOSE 147* 145* 179* 114*  --  161*  BUN 13 13 16 18   --  21  CREATININE 0.92 0.96 1.10 0.98  --  0.97  CALCIUM 8.1* 8.9 8.9 8.9  --  8.5*  MG 2.1 2.1  --  2.2  --   --   PROT  --   --   --   --  6.5  --   ALBUMIN  --   --   --   --  3.1*  --   AST  --   --   --   --  36  --   ALT  --   --   --   --  24  --   ALKPHOS  --   --   --   --  82  --   BILITOT  --   --   --   --  1.9*  --   GFRNONAA >60 >60 >60 >60  --  >60  ANIONGAP 9 9 11 13   --  11    Lipids No results for input(s): "CHOL", "TRIG", "HDL", "LABVLDL", "LDLCALC", "CHOLHDL" in the last 168 hours.  Hematology Recent Labs  Lab 10/07/22 0817 10/09/22 1033 10/10/22 0455  WBC 7.6 9.4 10.2  RBC 4.26 4.68 4.55  HGB 12.9* 13.8 13.3  HCT 38.6* 40.5 38.5*  MCV 90.6 86.5 84.6  MCH 30.3 29.5 29.2  MCHC 33.4 34.1 34.5  RDW 13.3 12.8 12.7  PLT 119* 145* 172   Thyroid No results for input(s): "TSH", "FREET4" in the last 168 hours.  BNPNo results for input(s): "BNP", "PROBNP" in the last 168 hours.  DDimer No results for input(s): "DDIMER" in the last 168 hours.   Radiology    CT HEAD WO CONTRAST  ( )  Result Date: 10/09/2022 CLINICAL DATA:  Hemorrhage, follow-up EXAM: CT HEAD WITHOUT CONTRAST TECHNIQUE: Contiguous axial images were obtained from the base of the skull through the vertex without intravenous contrast. RADIATION DOSE REDUCTION: This exam was performed according to the departmental dose-optimization program which includes automated exposure control, adjustment of the mA and/or kV according to patient size and/or use of iterative reconstruction technique. COMPARISON:  10/06/2022 FINDINGS: Brain: Unchanged appearance of bilateral makes intraparenchymal and subarachnoid hemorrhage. Similar associated edema and trace left to right midline shift. Similar mass effect on the left lateral ventricle. No new hemorrhage. No acute infarct, mass, or hydrocephalus. Vascular: No hyperdense vessel. Skull: Negative for fracture or focal lesion. Sinuses/Orbits: No acute finding. Other: The mastoids are well aerated. IMPRESSION: Unchanged appearance of bilateral intraparenchymal and subarachnoid hemorrhage. Similar associated edema and trace left to right midline shift. No new hemorrhage. Electronically Signed   By: Wiliam Ke M.D.   On: 10/09/2022 18:46   CT CORONARY MORPH W/CTA COR W/SCORE W/CA W/CM &/OR WO/CM  Addendum Date: 10/09/2022   ADDENDUM REPORT: 10/09/2022 16:49 EXAM: OVER-READ INTERPRETATION  CT CHEST The following report is an over-read performed by radiologist Dr. Joen Laura [facility] on 10/09/2022. This over-read does not include interpretation of cardiac or coronary anatomy or pathology. The coronary CTA interpretation by the cardiologist is attached. COMPARISON:  05/28/2022 FINDINGS: Mediastinum: No pathologic adenopathy. Thyroid and trachea are grossly normal. Enteric catheter is seen extending through the esophagus into the gastric lumen, tip excluded by slice selection. No esophageal wall thickening. Lungs: Bibasilar hypoventilatory changes. Upper lobe predominant  interlobular septal thickening and patchy ground-glass opacities consistent with developing edema. No effusion or pneumothorax. Central airways are patent. Upper abdomen: No acute findings. Musculoskeletal: No acute or destructive bony abnormalities. Reconstructed images demonstrate no additional findings. IMPRESSION: 1. Hypoventilatory changes, with mild superimposed congestive heart failure. Electronically Signed   By: Maxwell Caul.D.  On: 10/09/2022 16:49   Result Date: 10/09/2022 CLINICAL DATA:  LV Pseudo Anerusym Stroke EXAM: Cardiac CTA MEDICATIONS: No nitro / beta blocker given Patient post stroke and unable to hold breath during scan TECHNIQUE: The patient was scanned on a Siemens Force 192 slice scanner. Gantry rotation speed was 250 msecs. Collimation was .6 mm. A 120 kV gated retrospective scan was triggered in the ascending thoracic aorta at 140 HU's Reconstructions done every 10% of the cardiac cycle. Average HR during the scan was 76 bpm. The 3D data set was interpreted on a dedicated work station using MPR, MIP and VRT modes. A total of 80cc of contrast was used. FINDINGS: Calcium score not done as patient has had prior stents to LAD/LCX The SVG to the OM is occluded There is severe bi ventricular failure with D shaped septum Indicating elevated pulmonary artery pressures There appears to be a patch repair of the inferior And inferior septal wall There is a large burden of thrombus on top of the patch measuring as much As 1.6 cm thick There is no VSD. Pacing wires are noted in the RA and RV There is no pericardial effusion IMPRESSION: 1. 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 Large burden of thrombus on top of patch repair laminated but measuring as thick as 1.6 cm 3.  No pericardial effusion 4. Severe bi ventricular failure with D shaped septum indicating elevated PA pressures 5.  Occluded SVG to OM 6.  Pacing wires noted in RA/RV 7. Normal ascending  thoracic aorta 3.4 cm with no evidence of dissection 8.  Chicken wing appendage with no thrombus present Charlton Haws Electronically Signed: By: Charlton Haws M.D. On: 10/09/2022 16:31   DG CHEST PORT 1 VIEW  Result Date: 10/09/2022 CLINICAL DATA:  Respiratory abnormalities EXAM: PORTABLE CHEST 1 VIEW COMPARISON:  10/08/2022 FINDINGS: Single frontal view of the chest demonstrates stable single lead pacer/AICD. Enteric catheter passes below diaphragm tip excluded by collimation. Cardiac silhouette remains enlarged. There is mild central vascular congestion, with mild diffuse increased interstitial prominence consistent with developing interstitial edema. No effusion or pneumothorax. IMPRESSION: 1. Findings compatible with worsening volume status, with central vascular congestion and developing interstitial edema. Electronically Signed   By: Sharlet Salina M.D.   On: 10/09/2022 15:19   DG CHEST PORT 1 VIEW  Result Date: 10/08/2022 CLINICAL DATA:  Respiratory difficulty EXAM: PORTABLE CHEST 1 VIEW COMPARISON:  Previous studies including the examination of 10/05/2022 FINDINGS: Transverse diameter of heart is increased. There are no signs of areolar pulmonary edema. Low lung volumes. Linear densities are seen in lower lung fields on both sides. There is no pleural effusion or pneumothorax. Pacemaker/defibrillator battery is seen in the left infraclavicular region. Feeding tube is noted traversing the esophagus. IMPRESSION: Cardiomegaly. Increased markings are seen in both lower lung fields which may be related to low lung volumes or subsegmental atelectasis/pneumonia. Electronically Signed   By: Ernie Avena M.D.   On: 10/08/2022 12:45    Cardiac Studies   chocardiogram 10/07/2022   1. There is a large inferior pseudoaneurysm with a depth of 3 cm and a maximum width of roughly 5 cm. The "neck" at the level of the inferior wall measures 2.3 cm. The wall of the pseudoaneurysm is made up of a layer of  thrombus that is roughly 1.5 cm thick. No mobile thrombus is seen.. Left ventricular ejection fraction, by estimation, is 30 to 35%. The left ventricle has moderately decreased function. The left  ventricle demonstrates regional wall motion abnormalities (see scoring diagram/findings for description). The left ventricular internal cavity size was mildly dilated. There is akinesis of the left ventricular, basal-mid inferior wall and inferolateral wall. There is severe hypokinesis of the left ventricular, basal-mid inferoseptal wall. There is severe hypokinesis of the left ventricular, basal-mid lateral wall. There is peudoaneurysm of the left ventricular, basal inferior wall. Conclusion(s)/Recommendation(s): Findings are highly concerning for redevelopment of pseudoaneurysm of the inferior wall that was repaired in 2014. There appears to be dehiscence of the patch used to correct abnormality, with development of new pseudoaneurysm. No protruding or mobile thrombi are seen in the pseudoaneurysm, but a thick layer of thrombus makes up the external wall of the pseudoaneurysm. Recommend CT angiography or MR angiography of the heart for precise definition.    CARDIAC CTA 10/10/2022  IMPRESSION: 1. 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 Large burden of thrombus on top of patch repair laminated but measuring as thick as 1.6 cm   3.  No pericardial effusion   4. Severe bi ventricular failure with D shaped septum indicating elevated PA pressures   5.  Occluded SVG to OM   6.  Pacing wires noted in RA/RV   7. Normal ascending thoracic aorta 3.4 cm with no evidence of dissection   8.  Chicken wing appendage with no thrombus present  Patient Profile     62 y.o. male  with longstanding of history of CAD,s/p CABG and inferior wall pseudoaneurysm patch 2014, presents with acute embolic stroke and severe right hemiparesis/dysarthria complicated by hemorrhagic  transformation of acute stroke as well as areas of multiple old strokes, all of which have embolic appearance.  Echocardiogram is concerning for dehiscence of the pseudoaneurysm patch, with broad communication between the left ventricular cavity and a thrombus filled pseudoaneurysm.   Assessment & Plan    LV pseudoaneurysm: Whether or not the pseudoaneurysm patch is intact or not, there is a large burden of thrombus in contact with the LV blood pool. Will need to be anticoagulated long term. He is not a candidate for a difficult redo cardiac surgery at this time and may never be healthy enough for cardiac surgery.  Start oral anticoagulants, when it is felt to be safe from a neurological point of view.  Once on anticoagulation, will not restart antiplatelet agents. HFrEF: acute exacerbation, will give one more dose of IV diuretic. Moderately depressed left ventricular systolic function . Has an Abbott ICD in place.  Might be able to use this to help judge fluid status via thoracic impedance (he was enrolled in the heart failure device clinic as an outpatient).  Switch from metoprolol to his home dose of carvedilol. Was also on Farxiga, [place], spironolactone chronically, all of these are currently on hold and should be gradually reintroduced. CAD: No evidence for recent acute coronary syndrome.  On high-dose statin. Embolic stroke: Presented with a right brain stroke aborted with TNK, but then had a left MCA and right cerebellar acute infarct treated with thrombectomy, but complicated by left frontoparietal intracranial hemorrhage and subarachnoid hemorrhage.  Has right hemiparesis, inability to speak, unable to swallow.  Receiving speech therapy.  He also happens to have a moderate-sized PFO by transcranial Dopplers, but LV / pseudoaneurysm thrombus is the most likely cause of thromboembolic events        For questions or updates, please contact [facility] Please consult [email or web address] for  contact info under  Signed, Thurmon Fair, MD  10/10/2022, 11:38 AM

## 2022-10-10 NOTE — Progress Notes (Signed)
PHARMACY - PHYSICIAN COMMUNICATION CRITICAL VALUE ALERT - BLOOD CULTURE IDENTIFICATION (BCID)  [name] is an 62 y.o. male who presented to [facility] on 10/02/2022 with a chief complaint of stroke  Assessment: 54 YOM who presented with a stroke, currently on antibiotics for asp PNA, and now with 1 of 4 blood culture growing GPC with BCID detecting MSSE presumably a contaminant.   Name of physician (or Provider) Contacted: Stroke team  Current antibiotics: Ceftriaxone 1g IV every 24h  Changes to prescribed antibiotics recommended:  None - likely contamination  Results for orders placed or performed during the hospital encounter of 10/02/22  Blood Culture ID Panel (Reflexed) (Collected: 10/09/2022  1:10 PM)  Result Value Ref Range   Enterococcus faecalis NOT DETECTED NOT DETECTED   Enterococcus Faecium NOT DETECTED NOT DETECTED   Listeria monocytogenes NOT DETECTED NOT DETECTED   Staphylococcus species DETECTED (A) NOT DETECTED   Staphylococcus aureus (BCID) NOT DETECTED NOT DETECTED   Staphylococcus epidermidis DETECTED (A) NOT DETECTED   Staphylococcus lugdunensis NOT DETECTED NOT DETECTED   Streptococcus species NOT DETECTED NOT DETECTED   Streptococcus agalactiae NOT DETECTED NOT DETECTED   Streptococcus pneumoniae NOT DETECTED NOT DETECTED   Streptococcus pyogenes NOT DETECTED NOT DETECTED   A.calcoaceticus-baumannii NOT DETECTED NOT DETECTED   Bacteroides fragilis NOT DETECTED NOT DETECTED   Enterobacterales NOT DETECTED NOT DETECTED   Enterobacter cloacae complex NOT DETECTED NOT DETECTED   Escherichia coli NOT DETECTED NOT DETECTED   Klebsiella aerogenes NOT DETECTED NOT DETECTED   Klebsiella oxytoca NOT DETECTED NOT DETECTED   Klebsiella pneumoniae NOT DETECTED NOT DETECTED   Proteus species NOT DETECTED NOT DETECTED   Salmonella species NOT DETECTED NOT DETECTED   Serratia marcescens NOT DETECTED NOT DETECTED   Haemophilus influenzae NOT DETECTED NOT DETECTED    Neisseria meningitidis NOT DETECTED NOT DETECTED   Pseudomonas aeruginosa NOT DETECTED NOT DETECTED   Stenotrophomonas maltophilia NOT DETECTED NOT DETECTED   Candida albicans NOT DETECTED NOT DETECTED   Candida auris NOT DETECTED NOT DETECTED   Candida glabrata NOT DETECTED NOT DETECTED   Candida krusei NOT DETECTED NOT DETECTED   Candida parapsilosis NOT DETECTED NOT DETECTED   Candida tropicalis NOT DETECTED NOT DETECTED   Cryptococcus neoformans/gattii NOT DETECTED NOT DETECTED   Methicillin resistance mecA/C NOT DETECTED NOT DETECTED    Thank you for allowing pharmacy to be a part of this patient's care.  Georgina Pillion, PharmD, BCPS, BCIDP Infectious Diseases Clinical Pharmacist 10/10/2022 1:19 PM   **Pharmacist phone directory can now be found on [email or web address] (PW [email or web address]).  Listed under [facility].

## 2022-10-10 NOTE — Progress Notes (Signed)
Physical Therapy Treatment Patient Details Name: [name] MRN: [id number] DOB: 10/31/1960 Today's Date: 10/10/2022   History of Present Illness Patient is a 62 y/o male admitted 10/03/22 with L weakness and numbness.  Received TNK then developed now symptoms of aphasia and R weakness.  He underwent mechanical thrombectomy L M3 segment MCA noted to have small hemorrhage and follow up CT showed progression of hemorrhage 3cm then 7cm and small acute R cerebellar infarct. PMH positive for CAD s/p PCI w/stent and 1v CABG, multiple CGAs, systolic HF s/p ICD, HLD, HTN.    PT Comments    Patient with small progress today with sitting balance.  Seems to initiate better with L weight shift when cued by PT on his L side  to lean into PT.  Also with some improvement with head in midline, but all were short lived with pt with only focused attention and seemingly with L side pain (noted in chart review prior lumbar radicular symptoms on L side).  Patient unable to stand due to not placing weight on L and moving R foot to L when not stabilized by PT, so finally moved to chair via lift.  Noted pt not a candidate for intensive inpatient rehab due to no family to assist at d/c.  Recommend post-acute inpatient rehab <3 hours/day at d/c.   Recommendations for follow up therapy are one component of a multi-disciplinary discharge planning process, led by the attending physician.  Recommendations may be updated based on patient status, additional functional criteria and insurance authorization.  Follow Up Recommendations  Can patient physically be transported by private vehicle: No    Assistance Recommended at Discharge Frequent or constant Supervision/Assistance  Patient can return home with the following Two people to help with walking and/or transfers;A lot of help with bathing/dressing/bathroom;Assistance with cooking/housework;Direct supervision/assist for medications management;Assist for transportation;Help  with stairs or ramp for entrance;Assistance with feeding   Equipment Recommendations  Other (comment) (TBA)    Recommendations for Other Services       Precautions / Restrictions Precautions Precautions: Fall Precaution Comments: R inattention; SBP <160     Mobility  Bed Mobility   Bed Mobility: Supine to Sit     Supine to sit: Max assist, +2 for physical assistance, HOB elevated Sit to supine: Mod assist   General bed mobility comments: assist for lifting trunk and moving R leg off bed to sit, pt laying down to R so just assisted legs onto bed to supine    Transfers Overall transfer level: Needs assistance Equipment used: 2 person hand held assist Transfers: Sit to/from Stand Sit to Stand: Total assist, +2 physical assistance, +2 safety/equipment, From elevated surface           General transfer comment: attempted x 2 to stand, but pt not initiating to use L LE to stand moving it further to L so returned to supine and used lift for OOB Transfer via Lift Equipment: [place]  Ambulation/Gait                   Stairs             Wheelchair Mobility    Modified Rankin (Stroke Patients Only) Modified Rankin (Stroke Patients Only) Pre-Morbid Rankin Score: Moderate disability Modified Rankin: Severe disability     Balance Overall balance assessment: Needs assistance   Sitting balance-Leahy Scale: Poor Sitting balance - Comments: can sit with minguard A briefly when attention on balance and cues for leaning toward PT sitting  on L side; but short lived and needs assist and cues for maintaining balance to keep R foot from moving L and to keep head in midline; limited by focused attention Postural control: Right lateral lean                                  Cognition Arousal/Alertness: Awake/alert Behavior During Therapy: Flat affect Overall Cognitive Status: Impaired/Different from baseline Area of Impairment: Attention,  Safety/judgement, Awareness, Following commands                   Current Attention Level: Focused   Following Commands: Follows one step commands inconsistently, Follows one step commands with increased time Safety/Judgement: Decreased awareness of safety, Decreased awareness of deficits     General Comments: Not able to [facility] focus for balance on EOB; needing increased time for following one step commands but not always understanding.  Distracted by ?pain leaning off L hip to R and not placing weight on L to attempt to stand        Exercises      General Comments General comments (skin integrity, edema, etc.): Pateient eager for liquids, but per RN pt NPO so used toothette with suction for moistening his mouth and cleansing, pt using L hand some with assist to initiate      Pertinent Vitals/Pain Pain Assessment Faces Pain Scale: Hurts a little bit Pain Location: grimacing, but unable to indicate pain location Pain Descriptors / Indicators: Grimacing, Discomfort Pain Intervention(s): Monitored during session, Limited activity within patient's tolerance, Repositioned    Home Living                          Prior Function            PT Goals (current goals can now be found in the care plan section) Progress towards PT goals: Progressing toward goals    Frequency    Min 3X/week      PT Plan Discharge plan needs to be updated;Frequency needs to be updated    Co-evaluation              AM-PAC PT "6 Clicks" Mobility   Outcome Measure  Help needed turning from your back to your side while in a flat bed without using bedrails?: Total Help needed moving from lying on your back to sitting on the side of a flat bed without using bedrails?: Total Help needed moving to and from a bed to a chair (including a wheelchair)?: Total Help needed standing up from a chair using your arms (e.g., wheelchair or bedside chair)?: Total Help needed to walk in  hospital room?: Total Help needed climbing 3-5 steps with a railing? : Total 6 Click Score: 6    End of Session Equipment Utilized During Treatment: Gait belt Activity Tolerance: Patient limited by fatigue Patient left: in chair;with call bell/phone within reach;with chair alarm set Nurse Communication: Mobility status;Need for lift equipment PT Visit Diagnosis: Other abnormalities of gait and mobility (R26.89);Other symptoms and signs involving the nervous system (R29.898);Hemiplegia and hemiparesis Hemiplegia - Right/Left: Right Hemiplegia - dominant/non-dominant: Dominant Hemiplegia - caused by: Cerebral infarction;Other Nontraumatic intracranial hemorrhage     Time: 8295-6213 PT Time Calculation (min) (ACUTE ONLY): 28 min  Charges:  $Therapeutic Activity: 8-22 mins $Neuromuscular Re-education: 8-22 mins  Sheran Lawless, PT Acute Rehabilitation Services Office:[phone] 10/10/2022    Elray Mcgregor 10/10/2022, 2:17 PM

## 2022-10-10 NOTE — Progress Notes (Signed)
Nutrition Follow-up  DOCUMENTATION CODES:   Not applicable  INTERVENTION:  Osmolite 1.5 @65  ml/hr (1560 ml/day) Prosource TF20 daily   TF's at goal rate provides 2340 kcal, 118 g protein, 1188 ml fluid  MD to add free water flush    NUTRITION DIAGNOSIS:   Inadequate oral intake related to inability to eat as evidenced by NPO status.   GOAL:   Patient will meet greater than or equal to 90% of their needs   MONITOR:   Diet advancement, Labs, Weight trends, TF tolerance, I & O's  REASON FOR ASSESSMENT:   Consult Enteral/tube feeding initiation and management  ASSESSMENT:   62 y.o. male presented to the ED with L side weakness, slurred speech, slow speech, and feeling dizzy. PMH includes CAD, CHF, HLD, and HTN. Pt admitted for stroke.  Per MD notes: Concerns for aspiration?  +fever Alertness is decreasing Needs to restart TF's   Visited patient at bedside who was alert and did not provide any information. Osmolite 1.5 @50  ml/hr running at bedside via NG tube. RD agrees that he could benefit from continuous TF's until his mentation improves   Labs: Glu 161 Meds: Rocephin, MVI, protonix, miralax, senokot, nicoderm Wt: admit wt- 239#; current BW 237#  PO: no meals documented since 5/21 I/O's: +583 ml since admission   Diet Order:   Diet Order             Diet NPO time specified  Diet effective now                   EDUCATION NEEDS:   Not appropriate for education at this time  Skin:  Skin Assessment: Reviewed RN Assessment  Last BM:  6/25 type 6  Height:   Ht Readings from Last 1 Encounters:  10/03/22 6\' 1"  (1.854 m)    Weight:   Wt Readings from Last 1 Encounters:  10/10/22 107.7 kg    Ideal Body Weight:  83.6 kg  BMI:  Body mass index is 31.33 kg/m.  Estimated Nutritional Needs:   Kcal:  2300-2500  Protein:  115-130 grams  Fluid:  >/= 2 L    Leodis Rains, RDN, LDN  Clinical Nutrition

## 2022-10-10 NOTE — Progress Notes (Addendum)
STROKE TEAM PROGRESS NOTE   INTERVAL HISTORY Patient continues to be febrile overnight but otherwise remains hemodynamically stable.  1 out of 4 blood cultures show Staph epidermidis, likely contaminant.  No changes made to antibiotic regimen.    Vitals:   10/10/22 0603 10/10/22 0700 10/10/22 0800 10/10/22 1100  BP:  133/89 (!) 133/58 (!) 137/98  Pulse: 79 77 86 76  Resp: (!) 35 16 (!) 33 (!) 36  Temp:   97.7 F (36.5 C) 99.8 F (37.7 C)  TempSrc:   Axillary   SpO2: 97% 96% 96% 98%  Weight:      Height:       CBC:  Recent Labs  Lab 10/09/22 1033 10/10/22 0455  WBC 9.4 10.2  HGB 13.8 13.3  HCT 40.5 38.5*  MCV 86.5 84.6  PLT 145* 172    Basic Metabolic Panel:  Recent Labs  Lab 10/04/22 1355 10/04/22 1959 10/05/22 0746 10/06/22 0231 10/07/22 0817 10/09/22 1018 10/10/22 0455  NA 144   < > 146* 148*   < > 139 135  K  --   --  3.8 3.9   < > 4.2 3.7  CL  --   --  117* 117*   < > 106 101  CO2  --   --  20* 22   < > 20* 23  GLUCOSE  --   --  147* 145*   < > 114* 161*  BUN  --   --  13 13   < > 18 21  CREATININE  --   --  0.92 0.96   < > 0.98 0.97  CALCIUM  --   --  8.1* 8.9   < > 8.9 8.5*  MG 2.2  --  2.1 2.1  --  2.2  --   PHOS 2.3*  --  2.5  --   --   --   --    < > = values in this interval not displayed.    Lipid Panel:  No results for input(s): "CHOL", "TRIG", "HDL", "CHOLHDL", "VLDL", "LDLCALC" in the last 168 hours.  HgbA1c:  No results for input(s): "HGBA1C" in the last 168 hours.  Urine Drug Screen:  No results for input(s): "LABOPIA", "COCAINSCRNUR", "LABBENZ", "AMPHETMU", "THCU", "LABBARB" in the last 168 hours.   Alcohol Level  No results for input(s): "ETH" in the last 168 hours.   IMAGING past 24 hours CT HEAD WO CONTRAST ( )  Result Date: 10/09/2022 CLINICAL DATA:  Hemorrhage, follow-up EXAM: CT HEAD WITHOUT CONTRAST TECHNIQUE: Contiguous axial images were obtained from the base of the skull through the vertex without intravenous contrast.  RADIATION DOSE REDUCTION: This exam was performed according to the departmental dose-optimization program which includes automated exposure control, adjustment of the mA and/or kV according to patient size and/or use of iterative reconstruction technique. COMPARISON:  10/06/2022 FINDINGS: Brain: Unchanged appearance of bilateral makes intraparenchymal and subarachnoid hemorrhage. Similar associated edema and trace left to right midline shift. Similar mass effect on the left lateral ventricle. No new hemorrhage. No acute infarct, mass, or hydrocephalus. Vascular: No hyperdense vessel. Skull: Negative for fracture or focal lesion. Sinuses/Orbits: No acute finding. Other: The mastoids are well aerated. IMPRESSION: Unchanged appearance of bilateral intraparenchymal and subarachnoid hemorrhage. Similar associated edema and trace left to right midline shift. No new hemorrhage. Electronically Signed   By: Wiliam Ke M.D.   On: 10/09/2022 18:46   CT CORONARY MORPH W/CTA COR W/SCORE W/CA W/CM &/OR WO/CM  Addendum Date:  10/09/2022   ADDENDUM REPORT: 10/09/2022 16:49 EXAM: OVER-READ INTERPRETATION  CT CHEST The following report is an over-read performed by radiologist Dr. Joen Laura [facility] on 10/09/2022. This over-read does not include interpretation of cardiac or coronary anatomy or pathology. The coronary CTA interpretation by the cardiologist is attached. COMPARISON:  05/28/2022 FINDINGS: Mediastinum: No pathologic adenopathy. Thyroid and trachea are grossly normal. Enteric catheter is seen extending through the esophagus into the gastric lumen, tip excluded by slice selection. No esophageal wall thickening. Lungs: Bibasilar hypoventilatory changes. Upper lobe predominant interlobular septal thickening and patchy ground-glass opacities consistent with developing edema. No effusion or pneumothorax. Central airways are patent. Upper abdomen: No acute findings. Musculoskeletal: No acute or  destructive bony abnormalities. Reconstructed images demonstrate no additional findings. IMPRESSION: 1. Hypoventilatory changes, with mild superimposed congestive heart failure. Electronically Signed   By: Sharlet Salina M.D.   On: 10/09/2022 16:49   Result Date: 10/09/2022 CLINICAL DATA:  LV Pseudo Anerusym Stroke EXAM: Cardiac CTA MEDICATIONS: No nitro / beta blocker given Patient post stroke and unable to hold breath during scan TECHNIQUE: The patient was scanned on a Siemens Force 192 slice scanner. Gantry rotation speed was 250 msecs. Collimation was .6 mm. A 120 kV gated retrospective scan was triggered in the ascending thoracic aorta at 140 HU's Reconstructions done every 10% of the cardiac cycle. Average HR during the scan was 76 bpm. The 3D data set was interpreted on a dedicated work station using MPR, MIP and VRT modes. A total of 80cc of contrast was used. FINDINGS: Calcium score not done as patient has had prior stents to LAD/LCX The SVG to the OM is occluded There is severe bi ventricular failure with D shaped septum Indicating elevated pulmonary artery pressures There appears to be a patch repair of the inferior And inferior septal wall There is a large burden of thrombus on top of the patch measuring as much As 1.6 cm thick There is no VSD. Pacing wires are noted in the RA and RV There is no pericardial effusion IMPRESSION: 1. 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 Large burden of thrombus on top of patch repair laminated but measuring as thick as 1.6 cm 3.  No pericardial effusion 4. Severe bi ventricular failure with D shaped septum indicating elevated PA pressures 5.  Occluded SVG to OM 6.  Pacing wires noted in RA/RV 7. Normal ascending thoracic aorta 3.4 cm with no evidence of dissection 8.  Chicken wing appendage with no thrombus present Charlton Haws Electronically Signed: By: Charlton Haws M.D. On: 10/09/2022 16:31     Physical Exam  Constitutional:  Appears well-developed and well-nourished.  Cardiovascular: Normal rate and regular rhythm.  Respiratory: Effort normal on RA GI: Soft.  No distension. There is no tenderness.  Skin: WDI  Neuro: Mental Status: Patient is awake, alert, speech is dysarthric with expressive aphasia. Unable to assess orientation  Follows simple midline commands Cranial Nerves: II: Pupils are equal, round, and reactive to light.   III,IV, VI: EOMI without ptosis or diplopia.  VII: Mild right facial droop  VIII: Hearing is intact to voice XII: tongue is midline without atrophy or fasciculations.  Motor: Tone is normal. Bulk is normal.  Moves left upper and lower extremities spontaneously, withdraws right lower extremity to noxious, moves thumb on right upper extremity but no other movement there   ASSESSMENT/PLAN [name] is a 62 y.o. male with history of CAD s/p PCI w/  stent to LCS and LAD, s/p 1v CABG, multiple CVAs (R cerebellar 04/2022),  syscolic HF s/p ICD, HLD, HTN presenting with slurred speech and L sided weakness now s/p TNK and IR thrombectomy of L MCA (M3 segment) c/b  hemorrhage.   Patient will need anticoagulation due to left ventricular thrombus.  Plan is to repeat head CT on Friday, and if ICH is stable begin anticoaguation with heparin then  TIA - Right brain stroke aborted s/p TNK Stroke - Left MCA and right cerebellar infarcts left M3 occlusion s/p IR with TICIs, complicated by L frontoparietal ICH and SAH, etiology likely cardioembolic source  CT 6/18 no acute finding, but the bilateral frontal, parietal, occipital and cerebellum infarcts CTA head and neck unremarkable CTA head and neck repeat new short segment occlusion of distal left M2 S/p IR with left M3 TICIs reperfusion  post IR CT Hyperdensity within the left sylvian fissure may represent small subarachnoid hemorrhage versus contrast. CT Head: Progressed hemorrhage in L frontoparietal region w/ 3cm hematoma and  regional SAH MRI  progressive L fronto parietal hematoma (~7cm), regional SAH, increased mass effect.  Likely underlying infarct. Sm R cerebellar infarct, acute. Numerous chornic cerebral and cerebellar infarcts Repeat CT 6/19 increase in size of the left frontal/parietal hematoma. New extension into the left lateral ventricle. CT repeat 6/22 x 2 - New component of intraparenchymal hemorrhage in the posterior right hemisphere that measures approximately 2.5 x 1.4 cm. (On my review, more like right brain redistribution instead of new hemorrhage)  Repeat CT 6/23: Unchanged appearance of bilateral mixed intraparenchymal and subarachnoid hemorrhage. Trace rightward midline shift is unchanged. No hydrocephalus CT repeat 6/25 stable hemorrhage, unchanged Echo: Large inferior pseudoaneurysm with a depth of 3 cm and a maximum width of roughly 5 cm the wall of the pseudoaneurysm is made above the layer of thrombus that is roughly 1.5 cm thick. EF 30 to 35%, severe hypokinesis of left ventricle. No afib with ICD interrogation LE venous doppler neg for DVT TCD bubble study showed moderate sized PFO LDL 63 HgbA1c 5.8 UDS neg VTE prophylaxis - SQH  aspirin 81 mg daily and clopidogrel 75 mg daily prior to admission, now on No antithrombotic due to hemorrhage.  If repeat head CT on Friday stable, will consider heparin IV at that time Therapy recommendations:  CIR Disposition:  Cardiac Tele  CHF s/p ICD Systolic HF, s/p ICD 2/2 ICM.  Echo EF 30-35%  Trop 45 -> 43 -> 39 -> 35 CK 3714, CKMB 3.5 CXR 6/25 central vascular congestion and developing interstitial edema.  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  Cardiology on board, appreciate assistance  CTA of the heart intact chronic patch repair to inferior and inferior septal pseudoaneurysm, large burden of thrombus on top of patch repair, severe biventricular failure with D-shaped septum, occluded SVG to OM Possibly need to start heparin IV on Friday if repeat head CT stable Ultimately patient will need to repeat undergo repeat pseudoaneurysm repair if he becomes  healthy enough to tolerate surgery  Hyperlipidemia Home meds:  atorvastatin 80 LDL 63, goal < 70 Lipitor 80 resumed    Continue statin at discharge  Tobacco abuse Current smoker Smoking cessation counseling will be provided Nicotine patch provided  Fever ? Aspiration pneumonia Tmax 101.6 -> 99.8-> afebrile->100.8 -> 102-> 101.7 CBC WNL - WBC 7.6->9.4 UA negative CXR central vascular congestion and developing interstitial edema.  CXR 6/26 similar appearance of atelectasis at both lung bases Procalc negative.  Blood culture 1/4 bottles staph epidermidis, likely contaminant Ceftriaxone x5 days  Dysphagia  Vomiting on zofran PRN, improved Pt ate well 6/24, nocturnal TF d/c'ed -> fever with possible aspiration -> NPO and TF restarted  PFO Known PFO in the past TCD bubble study showed a moderate-sized PFO LE venous Doppler negative for DVT  Not an emergent issue at this time  Other stroke risk factors Obesity with BMI 32.23 CAD  Other Active Problems Thrombocytopenia, platelet 110->89-> 172 CK 436 -> 3714 -> 610-> 490  Hospital day # 8   Patient seen and examined by NP/APP with MD. MD to update note as needed.  Cortney E Ernestina Columbia , MSN, AGACNP-BC [facility] See Amion for schedule and pager information 10/10/2022 1:05 PM   ATTENDING NOTE: I  reviewed above note and agree with the assessment and plan. Pt was seen and examined.   No family at bedside.  Patient neuro condition and respiratory status remains unchanged.  Still has mild respiratory distress due to tachypnea.  Checks x-ray stable, will add another dose of Lasix.  BP stable, on Coreg 12.5 twice daily.  No repeat CT on Friday, if stable hemorrhage, will start heparin IV given high risk of embolic event.  Still has fever, concerning for aspiration pneumonia, on antibiotics.  Put on n.p.o. and start tube feeding.  PT and OT recommend SNF  For detailed assessment and plan, please refer to above/below as I have made changes wherever appropriate.   Marvel Plan, MD PhD Stroke Neurology 10/10/2022 6:56 PM  This patient is critically ill due to bilateral stroke, status post TNK, significant send hemorrhagic transformation, CHF status post ICD, large heart pseudoaneurysm and at significant risk of neurological worsening, death form recurrent stroke, hemorrhagic transformation with cerebral edema, brain herniation, heart failure, sepsis, aspiration. This patient's care requires constant monitoring of vital signs, hemodynamics, respiratory and cardiac monitoring, review of multiple databases, neurological assessment, discussion with family, other specialists and medical decision making of high complexity. I spent 35 minutes of neurocritical care time in the care of this patient.   To contact Stroke Continuity provider, please refer to [email or web address]. After hours, contact General Neurology

## 2022-10-11 DIAGNOSIS — I25810 Atherosclerosis of coronary artery bypass graft(s) without angina pectoris: Secondary | ICD-10-CM | POA: Diagnosis not present

## 2022-10-11 DIAGNOSIS — I611 Nontraumatic intracerebral hemorrhage in hemisphere, cortical: Secondary | ICD-10-CM

## 2022-10-11 DIAGNOSIS — I63413 Cerebral infarction due to embolism of bilateral middle cerebral arteries: Secondary | ICD-10-CM | POA: Diagnosis not present

## 2022-10-11 DIAGNOSIS — I5021 Acute systolic (congestive) heart failure: Secondary | ICD-10-CM | POA: Diagnosis not present

## 2022-10-11 DIAGNOSIS — I253 Aneurysm of heart: Secondary | ICD-10-CM | POA: Diagnosis not present

## 2022-10-11 DIAGNOSIS — I5043 Acute on chronic combined systolic (congestive) and diastolic (congestive) heart failure: Secondary | ICD-10-CM | POA: Diagnosis not present

## 2022-10-11 DIAGNOSIS — I513 Intracardiac thrombosis, not elsewhere classified: Secondary | ICD-10-CM | POA: Diagnosis not present

## 2022-10-11 LAB — GLUCOSE, CAPILLARY
Glucose-Capillary: 136 mg/dL — ABNORMAL HIGH (ref 70–99)
Glucose-Capillary: 146 mg/dL — ABNORMAL HIGH (ref 70–99)
Glucose-Capillary: 147 mg/dL — ABNORMAL HIGH (ref 70–99)
Glucose-Capillary: 148 mg/dL — ABNORMAL HIGH (ref 70–99)
Glucose-Capillary: 155 mg/dL — ABNORMAL HIGH (ref 70–99)
Glucose-Capillary: 162 mg/dL — ABNORMAL HIGH (ref 70–99)
Glucose-Capillary: 166 mg/dL — ABNORMAL HIGH (ref 70–99)

## 2022-10-11 LAB — CBC
HCT: 40.8 % (ref 39.0–52.0)
Hemoglobin: 13.7 g/dL (ref 13.0–17.0)
MCH: 29 pg (ref 26.0–34.0)
MCHC: 33.6 g/dL (ref 30.0–36.0)
MCV: 86.4 fL (ref 80.0–100.0)
Platelets: 190 10*3/uL (ref 150–400)
RBC: 4.72 MIL/uL (ref 4.22–5.81)
RDW: 12.6 % (ref 11.5–15.5)
WBC: 9.8 10*3/uL (ref 4.0–10.5)
nRBC: 0 % (ref 0.0–0.2)

## 2022-10-11 LAB — BASIC METABOLIC PANEL
Anion gap: 11 (ref 5–15)
BUN: 24 mg/dL — ABNORMAL HIGH (ref 8–23)
CO2: 24 mmol/L (ref 22–32)
Calcium: 8.1 mg/dL — ABNORMAL LOW (ref 8.9–10.3)
Chloride: 98 mmol/L (ref 98–111)
Creatinine, Ser: 0.96 mg/dL (ref 0.61–1.24)
GFR, Estimated: >60 mL/min (ref >60)
Glucose, Bld: 138 mg/dL — ABNORMAL HIGH (ref 70–99)
Potassium: 3.5 mmol/L (ref 3.5–5.1)
Sodium: 133 mmol/L — ABNORMAL LOW (ref 135–145)

## 2022-10-11 MED ORDER — CEFAZOLIN SODIUM-DEXTROSE 2-4 GM/100ML-% IV SOLN
2.0000 g | Freq: Three times a day (TID) | INTRAVENOUS | Status: DC
  Administered 2022-10-11 – 2022-10-29 (×55): 2 g via INTRAVENOUS
  Filled 2022-10-11 (×55): qty 100

## 2022-10-11 MED ORDER — CARVEDILOL 12.5 MG PO TABS
12.5000 mg | ORAL_TABLET | Freq: Two times a day (BID) | ORAL | Status: DC
  Administered 2022-10-11 – 2022-10-28 (×33): 12.5 mg
  Filled 2022-10-11 (×35): qty 1

## 2022-10-11 NOTE — Progress Notes (Signed)
Pharmacy: Antimicrobial Stewardship Review  Noted BCID call for 1 of 4 BCx yesterday growing MSSE - however now an additional bottle from the second set is also growing GPC - which makes this more concerning for a pathogen as opposed to a contaminant.   Noted ICD, new and old CVAs on MRI - would recommend ID consult for eval for ICD infection vs IE. To be discussed with stroke team.  Since no mecA detected on BCID - could be adequately covered with Cefazolin 2g/8h for now.   Still plausible GPCs could be different species - would have to await micro update to determine this.   Thank you for allowing pharmacy to be a part of this patient's care.  Georgina Pillion, PharmD, BCPS, BCIDP Infectious Diseases Clinical Pharmacist 10/11/2022 8:50 AM   **Pharmacist phone directory can now be found on [email or web address] ([email or web address]).  Listed under [facility].

## 2022-10-11 NOTE — Progress Notes (Addendum)
STROKE TEAM PROGRESS NOTE   INTERVAL HISTORY Patient continues to have low-grade fevers, but his blood pressure remains stable.  2 out of 4 blood cultures now positive for Staph epidermidis, ID consulted.  Vitals:   10/11/22 1000 10/11/22 1100 10/11/22 1300 10/11/22 1528  BP: (!) 127/98  (!) 149/87 (!) 134/97  Pulse: 73  76 66  Resp: 18  (!) 30 (!) 38  Temp:  99.4 F (37.4 C)    TempSrc:  Axillary    SpO2: 98%  94% 95%  Weight:      Height:       CBC:  Recent Labs  Lab 10/10/22 0455 10/11/22 0353  WBC 10.2 9.8  HGB 13.3 13.7  HCT 38.5* 40.8  MCV 84.6 86.4  PLT 172 190    Basic Metabolic Panel:  Recent Labs  Lab 10/05/22 0746 10/06/22 0231 10/07/22 0817 10/09/22 1018 10/10/22 0455 10/11/22 0353  NA 146* 148*   < > 139 135 133*  K 3.8 3.9   < > 4.2 3.7 3.5  CL 117* 117*   < > 106 101 98  CO2 20* 22   < > 20* 23 24  GLUCOSE 147* 145*   < > 114* 161* 138*  BUN 13 13   < > 18 21 24*  CREATININE 0.92 0.96   < > 0.98 0.97 0.96  CALCIUM 8.1* 8.9   < > 8.9 8.5* 8.1*  MG 2.1 2.1  --  2.2  --   --   PHOS 2.5  --   --   --   --   --    < > = values in this interval not displayed.     IMAGING past 24 hours No results found.   Physical Exam  Constitutional: Appears well-developed and well-nourished.  Cardiovascular: Normal rate and regular rhythm.  Respiratory:  Cheyne-Stokes respiratory pattern GI: Soft.  No distension. There is no tenderness.  Skin: WDI  Neuro: Mental Status: Patient is awake, alert, speech is dysarthric with expressive aphasia. Unable to assess orientation  Follows simple midline commands Cranial Nerves: II: Pupils are equal, round, and reactive to light.   III,IV, VI: EOMI without ptosis or diplopia.  VII: Mild right facial droop  VIII: Hearing is intact to voice XII: tongue is midline without atrophy or fasciculations.  Motor: Tone is normal. Bulk is normal.  Moves left upper and lower extremities spontaneously, withdraws right lower  extremity to noxious, slight movement of right upper extremity   ASSESSMENT/PLAN Mr. JONATHON TAN is a 62 y.o. male with history of CAD s/p PCI w/ stent to LCS and LAD, s/p 1v CABG, multiple CVAs (R cerebellar 04/2022),  syscolic HF s/p ICD, HLD, HTN presenting with slurred speech and L sided weakness now s/p TNK and IR thrombectomy of L MCA (M3 segment) c/b  hemorrhage.   Patient will need anticoagulation due to left ventricular thrombus.  Plan is to repeat head CT tomorrow, and if ICH is stable begin anticoaguation with heparin then  TIA - Right brain stroke aborted s/p TNK Stroke - Left MCA and right cerebellar infarcts left M3 occlusion s/p IR with TICIs, complicated by L frontoparietal ICH and SAH, etiology likely cardioembolic source  CT 6/18 no acute finding, but the bilateral frontal, parietal, occipital and cerebellum infarcts CTA head and neck unremarkable CTA head and neck repeat new short segment occlusion of distal left M2 S/p IR with left M3 TICIs reperfusion  post IR CT Hyperdensity within the left  sylvian fissure may represent small subarachnoid hemorrhage versus contrast. CT Head: Progressed hemorrhage in L frontoparietal region w/ 3cm hematoma and regional SAH MRI  progressive L fronto parietal hematoma (~7cm), regional SAH, increased mass effect.  Likely underlying infarct. Sm R cerebellar infarct, acute. Numerous chornic cerebral and cerebellar infarcts Repeat CT 6/19 increase in size of the left frontal/parietal hematoma. New extension into the left lateral ventricle. CT repeat 6/22 x 2 - New component of intraparenchymal hemorrhage in the posterior right hemisphere that measures approximately 2.5 x 1.4 cm. (On my review, more like right brain redistribution instead of new hemorrhage)  Repeat CT 6/23: Unchanged appearance of bilateral mixed intraparenchymal and subarachnoid hemorrhage. Trace rightward midline shift is unchanged. No hydrocephalus CT repeat 6/25 stable  hemorrhage, unchanged CT repeat in am Echo: Large inferior pseudoaneurysm with a depth of 3 cm and a maximum width of roughly 5 cm the wall of the pseudoaneurysm is made above the layer of thrombus that is roughly 1.5 cm thick. EF 30 to 35%, severe hypokinesis of left ventricle. No afib with ICD interrogation LE venous doppler neg for DVT TCD bubble study showed moderate sized PFO LDL 63 HgbA1c 5.8 UDS neg VTE prophylaxis - SQH  aspirin 81 mg daily and clopidogrel 75 mg daily prior to admission, now on No antithrombotic due to hemorrhage.  If repeat head CT in am stable, will consider heparin IV at that time Therapy recommendations:  CIR Disposition:  pending  CHF s/p ICD Systolic HF, s/p ICD 2/2 ICM.  Echo EF 30-35%  Trop 45 -> 43 -> 39 -> 35 CK 3714, CKMB 3.5 CXR 6/25 central vascular congestion and developing interstitial edema.  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  Cardiology on board, appreciate assistance  CTA of the heart intact chronic patch repair to inferior and inferior septal pseudoaneurysm, large burden of thrombus on top of patch repair, severe biventricular failure with D-shaped septum, occluded SVG to OM Need to start heparin IV on Friday if repeat head CT stable in am Ultimately patient will need to repeat undergo repeat pseudoaneurysm repair if he becomes healthy enough to tolerate surgery  Hyperlipidemia Home meds:  atorvastatin 80 LDL 63, goal < 70 Lipitor  80 resumed    Continue statin at discharge  Tobacco abuse Current smoker Smoking cessation counseling will be provided Nicotine patch provided  Fever ? Aspiration pneumonia Tmax 101.6 -> 99.8-> afebrile->100.8 -> 102-> 101.7-> 101.1 CBC WNL - WBC 7.6->9.4->10.2->9.8 UA negative CXR central vascular congestion and developing interstitial edema.  CXR 6/26 similar appearance of atelectasis at both lung bases Procalc negative.  Blood culture 2/4 bottles staph epidermidis, ID consulted, appreciate recommendations Ceftriaxone x5 days  Dysphagia  Vomiting on zofran PRN, improved Pt ate well 6/24, nocturnal TF d/c'ed -> fever with possible aspiration -> NPO and TF restarted 6/27 having difficulty coordinating swallowing and respirations, will leave n.p.o. for now  PFO Known PFO in the past TCD bubble study showed a moderate-sized PFO LE venous Doppler  negative for DVT  Not an emergent issue at this time  Other stroke risk factors Obesity with BMI 32.23 CAD  Other Active Problems Thrombocytopenia, platelet 110->89-> 172-> 190-> stable CK 436 -> 3714 -> 610-> 490  Hospital day # 9   Patient seen and examined by NP/APP with MD. MD to update note as needed.  Cortney E Ernestina Columbia , MSN, AGACNP-BC [facility] See Amion for schedule and pager information 10/11/2022 3:34 PM   ATTENDING NOTE: I reviewed above note and agree with the assessment and plan. Pt was seen and examined.   No family at bedside.  Patient awake alert, neuro stable, still had Cheyne-Stokes respiration pattern, on tube feeding, not candidate for p.o. yet per speech therapist.  Blood culture 2/4 positive, ID consulted, on Rocephin.  Repeat CT in a.m., if stable, will start heparin IV given high embolic risk.  Appreciate cardiology assistance, continue Coreg.  BP stable.  PT and OT recommend SNF.  For detailed assessment and plan, please refer to above/below as I have made changes wherever  appropriate.   Marvel Plan, MD PhD Stroke Neurology 10/11/2022 6:13 PM  This patient is critically ill due to bilateral stroke, status post TNK, significant send hemorrhagic transformation, CHF status post ICD, large heart pseudoaneurysm and at significant risk of neurological worsening, death form recurrent stroke, hemorrhagic transformation with cerebral edema, brain herniation, heart failure, sepsis, aspiration. This patient's care requires constant monitoring of vital signs, hemodynamics, respiratory and cardiac monitoring, review of multiple databases, neurological assessment, discussion with family, other specialists and medical decision making of high complexity. I spent 35 minutes of neurocritical care time in the care of this patient.  I discussed with speech therapist.

## 2022-10-11 NOTE — Progress Notes (Signed)
Rounding Note    Patient Name: [name] Date of Encounter: 10/11/2022  [facility] Cardiologist: Peter [place], MD   Subjective   Continues to be febrile. BCx x 2 +ve for Staph epi.  Has had excellent diuresis in last 2 days.  -2.9 liters last 2 days, still net positive from admission.   Inpatient Medications    Scheduled Meds:  atorvastatin  80 mg Per Tube Daily   carvedilol  12.5 mg Per Tube BID WC   Chlorhexidine Gluconate Cloth  6 each Topical Daily   feeding supplement (PROSource TF20)  60 mL Per Tube Daily   gabapentin  300 mg Per Tube QHS   heparin injection (subcutaneous)  5,000 Units Subcutaneous Q8H   insulin aspart  0-9 Units Subcutaneous Q4H   multivitamin with minerals  1 tablet Per Tube Daily   nicotine  7 mg Transdermal Daily   pantoprazole (PROTONIX) IV  40 mg Intravenous QHS   polyethylene glycol  17 g Per Tube BID   senna-docusate  1 tablet Per Tube BID   Continuous Infusions:   ceFAZolin (ANCEF) IV 2 g (10/11/22 0935)   feeding supplement (OSMOLITE 1.5 CAL) 65 mL/hr at 10/11/22 0840   PRN Meds: acetaminophen **OR** acetaminophen (TYLENOL) oral liquid 160 mg/5 mL **OR** acetaminophen, metoprolol tartrate, ondansetron (ZOFRAN) IV, mouth rinse, senna-docusate   Vital Signs    Vitals:   10/11/22 0700 10/11/22 0800 10/11/22 0900 10/11/22 1000  BP: 135/85 (!) 126/98 130/71 (!) 127/98  Pulse: 78 81 75 73  Resp: (!) 22 17 (!) 30 18  Temp:  99.6 F (37.6 C)    TempSrc:  Oral    SpO2: 99% 98% 96% 98%  Weight:      Height:        Intake/Output Summary (Last 24 hours) at 10/11/2022 1034 Last data filed at 10/11/2022 0840 Gross per 24 hour  Intake 911.33 ml  Output 3550 ml  Net -2638.67 ml      10/10/2022    5:00 AM 10/09/2022    5:00 AM 10/08/2022    5:00 AM  Last 3 Weights  Weight (lbs) 237 lb 7 oz 238 lb 12.1 oz 242 lb 4.6 oz  Weight (kg) 107.7 kg 108.3 kg 109.9 kg      Telemetry    NSR, occ PVCs - Personally  Reviewed  ECG    No new tracing - Personally Reviewed  Physical Exam  Alert GEN: No acute distress.   Neck: No JVD Cardiac: RRR, no murmurs, rubs, or gallops.  Respiratory: Clear to auscultation bilaterally. GI: Soft, nontender, non-distended  MS:R hemiparesis, no intelligible speech Neuro:  Nonfocal  Psych: Normal affect   Labs    High Sensitivity Troponin:   Recent Labs  Lab 10/06/22 1032 10/06/22 1257 10/06/22 1453 10/06/22 1748  TROPONINIHS 45* 43* 39* 35*     Chemistry Recent Labs  Lab 10/05/22 0746 10/06/22 0231 10/07/22 0817 10/09/22 1018 10/09/22 1751 10/10/22 0455 10/11/22 0353  NA 146* 148*   < > 139  --  135 133*  K 3.8 3.9   < > 4.2  --  3.7 3.5  CL 117* 117*   < > 106  --  101 98  CO2 20* 22   < > 20*  --  23 24  GLUCOSE 147* 145*   < > 114*  --  161* 138*  BUN 13 13   < > 18  --  21 24*  CREATININE 0.92 0.96   < >  0.98  --  0.97 0.96  CALCIUM 8.1* 8.9   < > 8.9  --  8.5* 8.1*  MG 2.1 2.1  --  2.2  --   --   --   PROT  --   --   --   --  6.5  --   --   ALBUMIN  --   --   --   --  3.1*  --   --   AST  --   --   --   --  36  --   --   ALT  --   --   --   --  24  --   --   ALKPHOS  --   --   --   --  82  --   --   BILITOT  --   --   --   --  1.9*  --   --   GFRNONAA >60 >60   < > >60  --  >60 >60  ANIONGAP 9 9   < > 13  --  11 11   < > = values in this interval not displayed.    Lipids No results for input(s): "CHOL", "TRIG", "HDL", "LABVLDL", "LDLCALC", "CHOLHDL" in the last 168 hours.  Hematology Recent Labs  Lab 10/09/22 1033 10/10/22 0455 10/11/22 0353  WBC 9.4 10.2 9.8  RBC 4.68 4.55 4.72  HGB 13.8 13.3 13.7  HCT 40.5 38.5* 40.8  MCV 86.5 84.6 86.4  MCH 29.5 29.2 29.0  MCHC 34.1 34.5 33.6  RDW 12.8 12.7 12.6  PLT 145* 172 190   Thyroid No results for input(s): "TSH", "FREET4" in the last 168 hours.  BNPNo results for input(s): "BNP", "PROBNP" in the last 168 hours.  DDimer No results for input(s): "DDIMER" in the last 168  hours.   Radiology    DG CHEST PORT 1 VIEW  Result Date: 10/10/2022 CLINICAL DATA:  Recent stroke.  Cough. EXAM: PORTABLE CHEST 1 VIEW COMPARISON:  10/09/2022 FINDINGS: AICD remains in place. Soft feeding tube enters the abdomen. Similar appearance of atelectasis at both lung bases. This could be minimally worsened. No dramatic change. The upper lobes remain clear. IMPRESSION: Similar appearance of atelectasis at both lung bases. This could be minimally worsened. Electronically Signed   By: Paulina Fusi M.D.   On: 10/10/2022 15:40   CT HEAD WO CONTRAST ( )  Result Date: 10/09/2022 CLINICAL DATA:  Hemorrhage, follow-up EXAM: CT HEAD WITHOUT CONTRAST TECHNIQUE: Contiguous axial images were obtained from the base of the skull through the vertex without intravenous contrast. RADIATION DOSE REDUCTION: This exam was performed according to the departmental dose-optimization program which includes automated exposure control, adjustment of the mA and/or kV according to patient size and/or use of iterative reconstruction technique. COMPARISON:  10/06/2022 FINDINGS: Brain: Unchanged appearance of bilateral makes intraparenchymal and subarachnoid hemorrhage. Similar associated edema and trace left to right midline shift. Similar mass effect on the left lateral ventricle. No new hemorrhage. No acute infarct, mass, or hydrocephalus. Vascular: No hyperdense vessel. Skull: Negative for fracture or focal lesion. Sinuses/Orbits: No acute finding. Other: The mastoids are well aerated. IMPRESSION: Unchanged appearance of bilateral intraparenchymal and subarachnoid hemorrhage. Similar associated edema and trace left to right midline shift. No new hemorrhage. Electronically Signed   By: Wiliam Ke M.D.   On: 10/09/2022 18:46   CT CORONARY MORPH W/CTA COR W/SCORE W/CA W/CM &/OR WO/CM  Addendum Date: 10/09/2022   ADDENDUM REPORT: 10/09/2022 16:49  EXAM: OVER-READ INTERPRETATION  CT CHEST The following report is an  over-read performed by radiologist Dr. Joen Laura [facility] on 10/09/2022. This over-read does not include interpretation of cardiac or coronary anatomy or pathology. The coronary CTA interpretation by the cardiologist is attached. COMPARISON:  05/28/2022 FINDINGS: Mediastinum: No pathologic adenopathy. Thyroid and trachea are grossly normal. Enteric catheter is seen extending through the esophagus into the gastric lumen, tip excluded by slice selection. No esophageal wall thickening. Lungs: Bibasilar hypoventilatory changes. Upper lobe predominant interlobular septal thickening and patchy ground-glass opacities consistent with developing edema. No effusion or pneumothorax. Central airways are patent. Upper abdomen: No acute findings. Musculoskeletal: No acute or destructive bony abnormalities. Reconstructed images demonstrate no additional findings. IMPRESSION: 1. Hypoventilatory changes, with mild superimposed congestive heart failure. Electronically Signed   By: Sharlet Salina M.D.   On: 10/09/2022 16:49   Result Date: 10/09/2022 CLINICAL DATA:  LV Pseudo Anerusym Stroke EXAM: Cardiac CTA MEDICATIONS: No nitro / beta blocker given Patient post stroke and unable to hold breath during scan TECHNIQUE: The patient was scanned on a Siemens Force 192 slice scanner. Gantry rotation speed was 250 msecs. Collimation was .6 mm. A 120 kV gated retrospective scan was triggered in the ascending thoracic aorta at 140 HU's Reconstructions done every 10% of the cardiac cycle. Average HR during the scan was 76 bpm. The 3D data set was interpreted on a dedicated work station using MPR, MIP and VRT modes. A total of 80cc of contrast was used. FINDINGS: Calcium score not done as patient has had prior stents to LAD/LCX The SVG to the OM is occluded There is severe bi ventricular failure with D shaped septum Indicating elevated pulmonary artery pressures There appears to be a patch repair of the inferior And  inferior septal wall There is a large burden of thrombus on top of the patch measuring as much As 1.6 cm thick There is no VSD. Pacing wires are noted in the RA and RV There is no pericardial effusion IMPRESSION: 1. 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 Large burden of thrombus on top of patch repair laminated but measuring as thick as 1.6 cm 3.  No pericardial effusion 4. Severe bi ventricular failure with D shaped septum indicating elevated PA pressures 5.  Occluded SVG to OM 6.  Pacing wires noted in RA/RV 7. Normal ascending thoracic aorta 3.4 cm with no evidence of dissection 8.  Chicken wing appendage with no thrombus present Charlton Haws Electronically Signed: By: Charlton Haws M.D. On: 10/09/2022 16:31   DG CHEST PORT 1 VIEW  Result Date: 10/09/2022 CLINICAL DATA:  Respiratory abnormalities EXAM: PORTABLE CHEST 1 VIEW COMPARISON:  10/08/2022 FINDINGS: Single frontal view of the chest demonstrates stable single lead pacer/AICD. Enteric catheter passes below diaphragm tip excluded by collimation. Cardiac silhouette remains enlarged. There is mild central vascular congestion, with mild diffuse increased interstitial prominence consistent with developing interstitial edema. No effusion or pneumothorax. IMPRESSION: 1. Findings compatible with worsening volume status, with central vascular congestion and developing interstitial edema. Electronically Signed   By: Sharlet Salina M.D.   On: 10/09/2022 15:19    Cardiac Studies    Echocardiogram 10/07/2022   1. There is a large inferior pseudoaneurysm with a depth of 3 cm and a maximum width of roughly 5 cm. The "neck" at the level of the inferior wall measures 2.3 cm. The wall of the pseudoaneurysm is made up of a layer of thrombus that is  roughly 1.5 cm thick. No mobile thrombus is seen.. Left ventricular ejection fraction, by estimation, is 30 to 35%. The left ventricle has moderately decreased function. The left  ventricle demonstrates regional wall motion abnormalities (see scoring diagram/findings for description). The left ventricular internal cavity size was mildly dilated. There is akinesis of the left ventricular, basal-mid inferior wall and inferolateral wall. There is severe hypokinesis of the left ventricular, basal-mid inferoseptal wall. There is severe hypokinesis of the left ventricular, basal-mid lateral wall. There is peudoaneurysm of the left ventricular, basal inferior wall. Conclusion(s)/Recommendation(s): Findings are highly concerning for redevelopment of pseudoaneurysm of the inferior wall that was repaired in 2014. There appears to be dehiscence of the patch used to correct abnormality, with development of new pseudoaneurysm. No protruding or mobile thrombi are seen in the pseudoaneurysm, but a thick layer of thrombus makes up the external wall of the pseudoaneurysm. Recommend CT angiography or MR angiography of the heart for precise definition.     CARDIAC CTA 10/10/2022   IMPRESSION: 1. 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 Large burden of thrombus on top of patch repair laminated but measuring as thick as 1.6 cm   3.  No pericardial effusion   4. Severe bi ventricular failure with D shaped septum indicating elevated PA pressures   5.  Occluded SVG to OM   6.  Pacing wires noted in RA/RV   7. Normal ascending thoracic aorta 3.4 cm with no evidence of dissection   8.  Chicken wing appendage with no thrombus present    Patient Profile     62 y.o. male   with longstanding of history of CAD,s/p CABG and inferior wall pseudoaneurysm patch 2014, presents with acute embolic stroke and severe right hemiparesis/dysarthria complicated by hemorrhagic transformation of acute stroke as well as areas of multiple old strokes, all of which have embolic appearance.  Echocardiogram is concerning for dehiscence of the pseudoaneurysm patch, with broad  communication between the left ventricular cavity and a thrombus filled pseudoaneurysm.   Assessment & Plan    LV pseudoaneurysm: Start oral anticoagulation when Ok'd by Neuro team. Whether or not the pseudoaneurysm patch is intact or not, there is a large burden of thrombus in contact with the LV blood pool. Will need to be anticoagulated long term. He is not a candidate for a difficult redo cardiac surgery at this time and may never be healthy enough for cardiac surgery.   HFrEF: improved after IV diuretic. Will place on daily diuretic via enteral route. Moderately depressed left ventricular systolic function . Has an Abbott ICD in place.  Might be able to use this to help judge fluid status via thoracic impedance (he was enrolled in the heart failure device clinic as an outpatient).  Receiving home dose of carvedilol. Was also on Farxiga, [place], spironolactone chronically, all of these have been on hold and should be gradually reintroduced. Will initiate starting dose of Entresto CAD: No evidence for recent acute coronary syndrome.  On high-dose statin. Embolic stroke/ICH: Presented with a right brain stroke aborted with TNK, but then had a left MCA and right cerebellar acute infarct treated with thrombectomy, but complicated by left frontoparietal intracranial hemorrhage and subarachnoid hemorrhage.  Has right hemiparesis, inability to speak, unable to swallow.  Receiving speech therapy.  He also happens to have a moderate-sized PFO by transcranial Dopplers, but LV / pseudoaneurysm thrombus is the most likely cause of thromboembolic events. Fever/Staph epi bacteremia: unfortunately raises concern  for endocarditis or ICD lead contamination. May need TEE, but I am a little worried about his airway. TEE would also give [place] another look at the LV thrombus/pseudoaneurysm patch.        For questions or updates, please contact [facility] Please consult [email or web address] for contact info under         Signed, Thurmon Fair, MD  10/11/2022, 10:34 AM

## 2022-10-11 NOTE — Consult Note (Signed)
[facility]    Date of Admission:  10/02/2022   Total days of inpatient antibiotics 1        Reason for Consult: MSSE + blood Cx    Principal Problem:   Stroke (cerebrum) [facility]   Assessment: 62 year old male with history of CHF s/p ICD, pericarditis to be infectious status post pseudoaneurysm repair with patch and admitted with code stroke found to have: MSSE positive blood cultures.  # MSSE positive blood cultures #LV pseudoaneurysm #History of infectious pericarditis status post presented as repair with patch #CHF status post ICD - TTE showed concerning for development of pseudoaneurysm of the inferior wall that was reported in 2014 recommended follow-up CTA.  CTA showed intact chronic patch repair with septal pseudoaneurysm, large.  Thomasin on top of patch repair laminated but measuring 1.6 cm.  - Transition to cefazolin. -He has a history of pericardial tamponade with excess pericarditis in 2014 status post repair of rupture of inferoposterior left ventricular pseudoaneurysm with CorMatrix patch, endovascular vein harvest left thigh negative cultures, necrosis and foul-smelling noted intraoperatively treated with imipenem x 14 days presented to the ED for code stroke.   Recommendations:  -Follow-up blood cultures, if they both speciated to be staph epi then we will call lab for sensitivities.  Given his overall picture of thrombus on pseudoaneurysm patch, AICD I would have a low threshold to treat his bacteremia. - Repeat blood cultures - Will eventually need TEE  #Embolic stroke/ICH, SAH -Right brain stroke reported with TNK but then had left MCA and right cerebellar acute infarct treated with thrombectomy complicated by left frontoparietal intracranial hemorrhage and subarachnoid hemorrhage-aphasia, unable to swallow receiving speech therapy Microbiology:   Antibiotics: Ceftriaxone 6/25-present - Cefazolin  6/27-present  Cultures: Blood 6/24 2/2 GPC, BCID + MSSE    HPI: [name] is a 62 y.o. male with history of CAD, CHF s/p ICD, HLD, tobacco abuse, pericardial tamponade with concern for pericarditis in 2014 status post repair of rupture of inferoposterior left ventricular pseudoaneurysm with CorMatrix patch, endovascular vein harvest left thigh negative cultures, necrosis and foul-smelling noted intraoperatively treated with imipenem x 14 days presented to the ED for code stroke.  Patient left-sided weakness, slurred speech, talking felt dizzy.  CT negative multiple old infarcts.  TTE showed concerning for redevelopment of pseudoaneurysm of the inferior wall that was repaired in 2014.  Dehiscence of past, development of new pseudoaneurysm development of thrombus that makes it the external wall of the pseudoaneurysm.,  Recommended CTA.  CTA showed intact chronic patch repair with septal pseudoaneurysm, large.  Thomasin on top of patch repair laminated but measuring 1.6 cm.  Blood cultures, BC ID positive for MSSE.   Review of Systems: Review of Systems  All other systems reviewed and are negative.   Past Medical History:  Diagnosis Date   Abnormal EKG    hx of ischemia showing up on ekg's   Acute myopericarditis    a. 03/2013: readm with hypoxia, tachycardia, elevated ESR/CRP, elevated troponin with Dressler syndrome and myopericarditis; treated with steroids.   Acute respiratory failure (HCC)    a. 01/2013: VDRF. b. 03/2013: hypoxia requiring supp O2 during adm, resolved by discharge.   C. difficile colitis    a. 01/2013 during prolonged adm. b. Recurred 03/2013.   Cardiac tamponade    a. 01/2013 s/p drain.   Cataracts, bilateral    Coronary artery disease    a. s/p MI in 2009  in [place] with stenting of the LCX and LAD;  b. 12/2012 NSTEMI/CAD: LM nl, LAD patent prox stent, LCX 50-70 isr (FFR 0.84), RCA dom, 163m, EF 40-45%-->Med Rx. c. 01/2013: anterolateral STEMI complicated by  pericardial effusion (presumed purulent pericarditis) and tamponade s/p drain, ruptured LV pseudoaneurysm s/p CorMatrix patch, CABGx1 (SVG-OM1), fever, CVA, VDRF, C diff.   CVA (cerebral infarction)    a. 01/2013 in setting of prolonged hospitalization - residual L arm weakness.   Dressler syndrome (HCC)    a. 03/2013: readm with hypoxia, tachycardia, elevated ESR/CRP, elevated troponin with Dressler syndrome and myopericarditis; treated with steroids.   Glaucoma    Hyperlipidemia    Hypokalemia    Hyponatremia    a. During late 2014.   Ischemic cardiomyopathy    a. Sept/Oct 2014: EF ~40% (ICM). b. 03/2013: EF 25-30%. Off ACEI due to hypotension (MIXED NICM/ICM).   Optic neuropathy, ischemic    Pericardial effusion    a. 01/2013: pericardial effusion (presumed purulent pericarditis) and cardiac tamponade s/p drain. b. Persistent moderate pericardial effusion 03/2013.   Pre-diabetes    Pseudoaneurysm of left ventricle of heart    a. 01/2013:  Ruptured inferoposterior LV pseudoaneurysm s/p CorMatrix patch.   Sinus bradycardia    asymptomatic   Stroke [facility]) 2009   weak lt side-lt arm   Tobacco abuse    Wears dentures    top    Social History   Tobacco Use   Smoking status: Every Day    Packs/day: 1.00    Years: 35.00    Additional pack years: 0.00    Total pack years: 35.00    Types: Cigarettes   Smokeless tobacco: Never  Vaping Use   Vaping Use: Never used  Substance Use Topics   Alcohol use: No    Alcohol/week: 0.0 standard drinks of alcohol   Drug use: No    Family History  Problem Relation Age of Onset   Heart attack Mother    Diabetes Mother    Hypertension Mother    Hypertension Father    CAD Other    HIV Brother    Stroke Neg Hx    Scheduled Meds:  atorvastatin  80 mg Per Tube Daily   carvedilol  12.5 mg Per Tube BID WC   Chlorhexidine Gluconate Cloth  6 each Topical Daily   feeding supplement (PROSource TF20)  60 mL Per Tube Daily   gabapentin  300 mg  Per Tube QHS   heparin injection (subcutaneous)  5,000 Units Subcutaneous Q8H   insulin aspart  0-9 Units Subcutaneous Q4H   multivitamin with minerals  1 tablet Per Tube Daily   nicotine  7 mg Transdermal Daily   pantoprazole (PROTONIX) IV  40 mg Intravenous QHS   polyethylene glycol  17 g Per Tube BID   senna-docusate  1 tablet Per Tube BID   Continuous Infusions:   ceFAZolin (ANCEF) IV 2 g (10/11/22 0935)   feeding supplement (OSMOLITE 1.5 CAL) 65 mL/hr at 10/11/22 0840   PRN Meds:.acetaminophen **OR** acetaminophen (TYLENOL) oral liquid 160 mg/5 mL **OR** acetaminophen, metoprolol tartrate, ondansetron (ZOFRAN) IV, mouth rinse, senna-docusate No Known Allergies  OBJECTIVE: Blood pressure (!) 127/98, pulse 73, temperature 99.4 F (37.4 C), temperature source Axillary, resp. rate 18, height 6\' 1"  (1.854 m), weight 107.7 kg, SpO2 98 %.  Physical Exam Constitutional:      General: He is not in acute distress.    Appearance: He is normal weight. He is not toxic-appearing.  Comments: Aphasia  HENT:     Head: Normocephalic and atraumatic.     Right Ear: External ear normal.     Left Ear: External ear normal.     Nose: No congestion or rhinorrhea.     Mouth/Throat:     Mouth: Mucous membranes are moist.     Pharynx: Oropharynx is clear.  Eyes:     Extraocular Movements: Extraocular movements intact.     Conjunctiva/sclera: Conjunctivae normal.     Pupils: Pupils are equal, round, and reactive to light.  Cardiovascular:     Rate and Rhythm: Normal rate and regular rhythm.     Heart sounds: No murmur heard.    No friction rub. No gallop.  Pulmonary:     Effort: Pulmonary effort is normal.     Breath sounds: Normal breath sounds.  Abdominal:     General: Abdomen is flat. Bowel sounds are normal.     Palpations: Abdomen is soft.  Musculoskeletal:        General: No swelling. Normal range of motion.     Cervical back: Normal range of motion and neck supple.  Skin:     General: Skin is warm and dry.     Lab Results Lab Results  Component Value Date   WBC 9.8 10/11/2022   HGB 13.7 10/11/2022   HCT 40.8 10/11/2022   MCV 86.4 10/11/2022   PLT 190 10/11/2022    Lab Results  Component Value Date   CREATININE 0.96 10/11/2022   BUN 24 (H) 10/11/2022   NA 133 (L) 10/11/2022   K 3.5 10/11/2022   CL 98 10/11/2022   CO2 24 10/11/2022    Lab Results  Component Value Date   ALT 24 10/09/2022   AST 36 10/09/2022   ALKPHOS 82 10/09/2022   BILITOT 1.9 (H) 10/09/2022       Danelle Earthly, MD [facility] 10/11/2022, 11:58 AM   I have personally spent 82 minutes involved in face-to-face and non-face-to-face activities for this patient on the day of the visit. Professional time spent includes the following activities: Preparing to see the patient (review of tests), Obtaining and/or reviewing separately obtained history (admission/discharge record), Performing a medically appropriate examination and/or evaluation , Ordering medications/tests/procedures, referring and communicating with other health care professionals, Documenting clinical information in the EMR, Independently interpreting results (not separately reported), Communicating results to the patient/family/caregiver, Counseling and educating the patient/family/caregiver and Care coordination (not separately reported).

## 2022-10-11 NOTE — Progress Notes (Signed)
Speech Language Pathology Treatment: Dysphagia  Patient Details Name: [name] MRN: [id number] DOB: December 08, 1960 Today's Date: 10/11/2022 Time: 5621-3086 SLP Time Calculation (min) (ACUTE ONLY): 18 min  Assessment / Plan / Recommendation Clinical Impression  Pt was seen for treatment. He was alert and cooperative during the session. He responded to simple yes/no questions with some improvement in reliability. Speech intelligibility was significantly impacted by reduced vocal intensity and articulatory imprecision. Pt was intermittently tachypneic throughout the session with RR increasing to the mid 30s and low 40s. SLP suspects that his respiratory status negatively impacted his tolerance of p.o. trials during this session due to reduced ability to adequately coordinate respiration with speech. Pt inconsistently exhibited coughing with nectar thick liquids and puree. Oral phase impairments continue to be noted with reduced labial seal and impaired lingual manipulation. It is recommended that the pt's NPO status be maintained until his respiratory status improves enough to adequately support swallowing safety. SLP will continue to follow pt.    HPI HPI: Pt is a 62 y/o male who presented 6/19 with L weakness and numbness. Pt received TNK then developed new symptoms of aphasia and R weakness. He underwent mechanical thrombectomy L M3 segment MCA 6/19. Pt noted to have small hemorrhage and follow up CT showed progression of hemorrhage 3cm then 7cm and small acute R cerebellar infarct. PMH: CAD s/p PCI w/stent and 1v CABG, multiple CGAs, systolic HF s/p ICD, HLD, HTN.      SLP Plan  Continue with current plan of care      Recommendations for follow up therapy are one component of a multi-disciplinary discharge planning process, led by the attending physician.  Recommendations may be updated based on patient status, additional functional criteria and insurance authorization.    Recommendations   Diet recommendations: NPO Medication Administration: Via alternative means                  Oral care BID   Frequent or constant Supervision/Assistance Dysarthria and anarthria (R47.1);Cognitive communication deficit (R41.841);Aphasia (R47.01)     Continue with current plan of care    [name] I. Vear Clock, MS, CCC-SLP Neuro Diagnostic Specialist  Acute Rehabilitation Services Office number: [phone]  Scheryl Marten  10/11/2022, 3:18 PM

## 2022-10-11 NOTE — Progress Notes (Signed)
Occupational Therapy Treatment Patient Details Name: [name] MRN: [id number] DOB: 1960-11-04 Today's Date: 10/11/2022   History of present illness Patient is a 62 y/o male admitted 10/03/22 with L weakness and numbness.  Received TNK then developed now symptoms of aphasia and R weakness.  He underwent mechanical thrombectomy L M3 segment MCA noted to have small hemorrhage and follow up CT showed progression of hemorrhage 3cm then 7cm and small acute R cerebellar infarct. PMH positive for CAD s/p PCI w/stent and 1v CABG, multiple CGAs, systolic HF s/p ICD, HLD, HTN.   OT comments  Pt making slow progress toward goals however good participation with OT. Session completed in chair position at bed level to work on functional tasks in supported sitting. Increased ability to complete oral care with use of backward chaining to complete. Patient will benefit from continued inpatient follow up therapy, <3 hours/day.    Recommendations for follow up therapy are one component of a multi-disciplinary discharge planning process, led by the attending physician.  Recommendations may be updated based on patient status, additional functional criteria and insurance authorization.    Assistance Recommended at Discharge Frequent or constant Supervision/Assistance  Patient can return home with the following  Two people to help with walking and/or transfers;Two people to help with bathing/dressing/bathroom;Assistance with cooking/housework;Assistance with feeding;Help with stairs or ramp for entrance;Direct supervision/assist for financial management;Assist for transportation;Direct supervision/assist for medications management   Equipment Recommendations  Other (comment) (defer to next venue of care)    Recommendations for Other Services      Precautions / Restrictions Precautions Precautions: Fall Precaution Comments: R inattention; SBP <160 Restrictions Weight Bearing Restrictions: No (Simultaneous  filing. User may not have seen previous data.)       Mobility Bed Mobility               General bed mobility comments: chair position used to have pt pull forward and lateral weight shifts    Transfers                   General transfer comment: [place]     Balance Overall balance assessment: Needs assistance Sitting-balance support: Bilateral upper extremity supported, Single extremity supported, Feet supported Sitting balance-Leahy Scale: Poor                                     ADL either performed or assessed with clinical judgement   ADL Overall ADL's : Needs assistance/impaired Eating/Feeding: Sitting   Grooming: Wash/dry face;Oral care;Wash/dry hands;Cueing for sequencing;Sitting;Moderate assistance                               Functional mobility during ADLs: Moderate assistance (for bed mobility)      Extremity/Trunk Assessment Upper Extremity Assessment Upper Extremity Assessment: RUE deficits/detail RUE Deficits / Details: Trying to spontaneously use; able to make fist on command; tryign to use for B hand tasks; pt grabbed my arm to move it thinking it was his own RUE Sensation: decreased light touch RUE Coordination: decreased fine motor;decreased gross motor   Lower Extremity Assessment Lower Extremity Assessment: Defer to PT evaluation        Vision   Vision Assessment?: Vision impaired- to be further tested in functional context Additional Comments: ? R field cut; wears glasses   Perception Perception Perception: Impaired (r inattention)   Praxis Praxis  Praxis: Impaired Praxis Impairment Details: Initiation;Perseveration;Motor planning Praxis-Other Comments: difficulty sequencing steps for oral care; required use of backward chaining    Cognition Arousal/Alertness: Awake/alert Behavior During Therapy: Flat affect, Restless Overall Cognitive Status: Impaired/Different from baseline Area of  Impairment: Attention, Safety/judgement, Awareness, Following commands, Problem solving                   Current Attention Level: Sustained   Following Commands: Follows one step commands inconsistently, Follows one step commands with increased time Safety/Judgement: Decreased awareness of safety, Decreased awareness of deficits Awareness: Intellectual Problem Solving: Slow processing General Comments: perseveration noted        Exercises Exercises: General Upper Extremity General Exercises - Upper Extremity Shoulder Flexion: AAROM, Right, 10 reps Shoulder Horizontal ABduction: AAROM, Right, 10 reps Elbow Flexion: AAROM, Right, 10 reps Elbow Extension: AAROM, Right, 10 reps Wrist Flexion: AROM, Right, 5 reps    Shoulder Instructions       General Comments  Using chair position to work on supported sitting; using LUE to weight shift forward and laterally; weight bearing through RUE on handrail and pushing to upright posture; improves with repetition    Pertinent Vitals/ Pain       Pain Assessment Pain Assessment: Faces Faces Pain Scale: Hurts little more Pain Location: grimacing, restless - buttocks appeared uncomfortable Pain Descriptors / Indicators: Grimacing, Discomfort Pain Intervention(s): Limited activity within patient's tolerance  Home Living Family/patient expects to be discharged to:: Skilled nursing facility Living Arrangements: Spouse/significant other                                      Prior Functioning/Environment              Frequency  Min 2X/week        Progress Toward Goals  OT Goals(current goals can now be found in the care plan section)  Progress towards OT goals: Progressing toward goals  Acute Rehab OT Goals OT Goal Formulation: Patient unable to participate in goal setting Time For Goal Achievement: 10/19/22 Potential to Achieve Goals: Fair ADL Goals Pt Will Perform Grooming: with set-up;sitting Pt Will  Perform Upper Body Bathing: with min assist;sitting Pt Will Perform Lower Body Bathing: with mod assist;sit to/from stand Pt Will Perform Upper Body Dressing: with min assist;sitting Pt Will Perform Lower Body Dressing: with mod assist;sit to/from stand Pt Will Transfer to Toilet: stand pivot transfer;bedside commode;with mod assist  Plan Discharge plan remains appropriate    Co-evaluation                 AM-PAC OT "6 Clicks" Daily Activity     Outcome Measure   Help from another person eating meals?: Total Help from another person taking care of personal grooming?: A Lot Help from another person toileting, which includes using toliet, bedpan, or urinal?: Total Help from another person bathing (including washing, rinsing, drying)?: Total Help from another person to put on and taking off regular upper body clothing?: Total Help from another person to put on and taking off regular lower body clothing?: Total 6 Click Score: 7    End of Session    OT Visit Diagnosis: Unsteadiness on feet (R26.81);Other abnormalities of gait and mobility (R26.89);Muscle weakness (generalized) (M62.81);Cognitive communication deficit (R41.841);Hemiplegia and hemiparesis;Low vision, both eyes (H54.2);Apraxia (R48.2);Other symptoms and signs involving cognitive function Symptoms and signs involving cognitive functions: Cerebral infarction Hemiplegia - Right/Left: Right Hemiplegia -  dominant/non-dominant: Dominant Hemiplegia - caused by: Cerebral infarction   Activity Tolerance Patient tolerated treatment well   Patient Left in bed;with call bell/phone within reach;with bed alarm set (modified chair position)   Nurse Communication Mobility status        Time: 4098-1191 OT Time Calculation (min): 17 min  Charges: OT General Charges $OT Visit: 1 Visit OT Treatments $Self Care/Home Management : 8-22 mins  Luisa Dago, OT/L   Acute OT Clinical Specialist Acute Rehabilitation  Services Pager [phone] Office [phone]   [facility] 10/11/2022, 4:12 PM

## 2022-10-12 ENCOUNTER — Inpatient Hospital Stay (HOSPITAL_COMMUNITY): Payer: Medicare PPO

## 2022-10-12 DIAGNOSIS — R7881 Bacteremia: Secondary | ICD-10-CM

## 2022-10-12 DIAGNOSIS — I63412 Cerebral infarction due to embolism of left middle cerebral artery: Secondary | ICD-10-CM | POA: Diagnosis not present

## 2022-10-12 DIAGNOSIS — I63413 Cerebral infarction due to embolism of bilateral middle cerebral arteries: Secondary | ICD-10-CM | POA: Diagnosis not present

## 2022-10-12 DIAGNOSIS — R509 Fever, unspecified: Secondary | ICD-10-CM | POA: Diagnosis not present

## 2022-10-12 DIAGNOSIS — I25810 Atherosclerosis of coronary artery bypass graft(s) without angina pectoris: Secondary | ICD-10-CM | POA: Diagnosis not present

## 2022-10-12 DIAGNOSIS — B957 Other staphylococcus as the cause of diseases classified elsewhere: Secondary | ICD-10-CM

## 2022-10-12 DIAGNOSIS — T827XXA Infection and inflammatory reaction due to other cardiac and vascular devices, implants and grafts, initial encounter: Secondary | ICD-10-CM | POA: Diagnosis not present

## 2022-10-12 DIAGNOSIS — I253 Aneurysm of heart: Secondary | ICD-10-CM | POA: Diagnosis not present

## 2022-10-12 LAB — BASIC METABOLIC PANEL
Anion gap: 11 (ref 5–15)
BUN: 20 mg/dL (ref 8–23)
CO2: 22 mmol/L (ref 22–32)
Calcium: 8.5 mg/dL — ABNORMAL LOW (ref 8.9–10.3)
Chloride: 99 mmol/L (ref 98–111)
Creatinine, Ser: 0.93 mg/dL (ref 0.61–1.24)
GFR, Estimated: >60 mL/min (ref >60)
Glucose, Bld: 117 mg/dL — ABNORMAL HIGH (ref 70–99)
Potassium: 3.9 mmol/L (ref 3.5–5.1)
Sodium: 132 mmol/L — ABNORMAL LOW (ref 135–145)

## 2022-10-12 LAB — CBC
HCT: 43.6 % (ref 39.0–52.0)
Hemoglobin: 15.3 g/dL (ref 13.0–17.0)
MCH: 30.1 pg (ref 26.0–34.0)
MCHC: 35.1 g/dL (ref 30.0–36.0)
MCV: 85.8 fL (ref 80.0–100.0)
Platelets: 178 10*3/uL (ref 150–400)
RBC: 5.08 MIL/uL (ref 4.22–5.81)
RDW: 12.5 % (ref 11.5–15.5)
WBC: 10.2 10*3/uL (ref 4.0–10.5)
nRBC: 0 % (ref 0.0–0.2)

## 2022-10-12 LAB — GLUCOSE, CAPILLARY
Glucose-Capillary: 122 mg/dL — ABNORMAL HIGH (ref 70–99)
Glucose-Capillary: 138 mg/dL — ABNORMAL HIGH (ref 70–99)
Glucose-Capillary: 147 mg/dL — ABNORMAL HIGH (ref 70–99)
Glucose-Capillary: 183 mg/dL — ABNORMAL HIGH (ref 70–99)
Glucose-Capillary: 186 mg/dL — ABNORMAL HIGH (ref 70–99)
Glucose-Capillary: 210 mg/dL — ABNORMAL HIGH (ref 70–99)

## 2022-10-12 LAB — CULTURE, BLOOD (ROUTINE X 2)

## 2022-10-12 LAB — HEPARIN LEVEL (UNFRACTIONATED)
Heparin Unfractionated: <0.1 IU/mL — ABNORMAL LOW (ref 0.30–0.70)
Heparin Unfractionated: 0.48 IU/mL (ref 0.30–0.70)

## 2022-10-12 MED ORDER — SPIRONOLACTONE 12.5 MG HALF TABLET
12.5000 mg | ORAL_TABLET | Freq: Every day | ORAL | Status: DC
  Filled 2022-10-12: qty 1

## 2022-10-12 MED ORDER — HEPARIN (PORCINE) 25000 UT/250ML-% IV SOLN
900.0000 [IU]/h | INTRAVENOUS | Status: AC
  Administered 2022-10-12: 1200 [IU]/h via INTRAVENOUS
  Administered 2022-10-13: 1350 [IU]/h via INTRAVENOUS
  Administered 2022-10-14 – 2022-10-17 (×5): 1200 [IU]/h via INTRAVENOUS
  Administered 2022-10-18: 1150 [IU]/h via INTRAVENOUS
  Administered 2022-10-19: 1000 [IU]/h via INTRAVENOUS
  Administered 2022-10-20: 900 [IU]/h via INTRAVENOUS
  Administered 2022-10-21: 850 [IU]/h via INTRAVENOUS
  Administered 2022-10-22: 900 [IU]/h via INTRAVENOUS
  Filled 2022-10-12 (×13): qty 250

## 2022-10-12 MED ORDER — FUROSEMIDE 40 MG PO TABS: 40.0000 mg | ORAL_TABLET | Freq: Every day | ORAL | Status: DC

## 2022-10-12 MED ORDER — SACUBITRIL-VALSARTAN 49-51 MG PO TABS
1.0000 | ORAL_TABLET | Freq: Two times a day (BID) | ORAL | Status: DC
  Filled 2022-10-12: qty 1

## 2022-10-12 MED ORDER — FUROSEMIDE 40 MG PO TABS
40.0000 mg | ORAL_TABLET | Freq: Every day | ORAL | Status: DC
  Administered 2022-10-12 – 2022-10-14 (×3): 40 mg
  Filled 2022-10-12 (×3): qty 1

## 2022-10-12 MED ORDER — SPIRONOLACTONE 12.5 MG HALF TABLET
12.5000 mg | ORAL_TABLET | Freq: Every day | ORAL | Status: DC
  Administered 2022-10-12 – 2022-10-30 (×17): 12.5 mg
  Filled 2022-10-12 (×18): qty 1

## 2022-10-12 MED ORDER — SACUBITRIL-VALSARTAN 49-51 MG PO TABS
1.0000 | ORAL_TABLET | Freq: Two times a day (BID) | ORAL | Status: DC
  Administered 2022-10-12 – 2022-10-22 (×22): 1
  Filled 2022-10-12 (×23): qty 1

## 2022-10-12 NOTE — Consult Note (Signed)
Cardiology Consultation   Patient ID: SHADEE RIDL MRN: [id number]; DOB: Jun 05, 1960  Admit date: 10/02/2022 Date of Consult: 10/12/2022  PCP:  [name], MD   [facility] Providers Cardiologist:  Peter Swaziland, MD  Electrophysiologist:  Regan Lemming, MD  {  Patient Profile:   [name] is a 62 y.o. male with a hx of CAD (PCI 2009/CABG 2014), chronic CHF (systolic), ICM w/ICD, hx of pericarditis (2014), HTN, HLD, prior stroke who is being seen 10/12/2022 for the evaluation of ICD in the environment of bacteremia at the request of ID/device/bacteremia flag.  Device information Abbott single chamber ICD implanted 05/25/20 Carolyn Stare w/7122 [address] lead  History of Present Illness:   [name] was admitted as a code stroke > TNK > developed worsening stroke symptoms > IR thrombectomy >> developed bleeding.  Yesterday's CT IMPRESSION: 1. Extensive bilateral cerebral and cerebellar hemisphere petechial and infiltrative intra-axial hemorrhage is stable since 10/09/2022. The largest confluent hematoma is in the left posterior MCA territory. SAH and IVH have regressed since 10/03/2022. 2. Combination of brain edema and encephalomalacia superimposed on the above does not appear significantly changed. 3. Stable subsequent intracranial mass effect since 10/09/2022 with up to 3 mm of rightward midline shift, patent basilar cisterns.   Cardiology brought on board 2/2 abnormal echo/.  EP flagged for bacteremia in a patient with device.  The patient is confused, no verbal or other response to calling his name, staff is bathing him.  He has been started on heparin for LV thrombus  ID is on board MSSE positive blood cultures  Follow-up blood cultures, if they both speciated to be staph epi then we will call lab for sensitivities. Given his overall picture of thrombus on pseudoaneurysm patch, AICD I would have a low threshold to treat his bacteremia.   Past  Medical History:  Diagnosis Date   Abnormal EKG    hx of ischemia showing up on ekg's   Acute myopericarditis    a. 03/2013: readm with hypoxia, tachycardia, elevated ESR/CRP, elevated troponin with Dressler syndrome and myopericarditis; treated with steroids.   Acute respiratory failure (HCC)    a. 01/2013: VDRF. b. 03/2013: hypoxia requiring supp O2 during adm, resolved by discharge.   C. difficile colitis    a. 01/2013 during prolonged adm. b. Recurred 03/2013.   Cardiac tamponade    a. 01/2013 s/p drain.   Cataracts, bilateral    Coronary artery disease    a. s/p MI in 2009 in [place] with stenting of the LCX and LAD;  b. 12/2012 NSTEMI/CAD: LM nl, LAD patent prox stent, LCX 50-70 isr (FFR 0.84), RCA dom, 121m, EF 40-45%-->Med Rx. c. 01/2013: anterolateral STEMI complicated by pericardial effusion (presumed purulent pericarditis) and tamponade s/p drain, ruptured LV pseudoaneurysm s/p CorMatrix patch, CABGx1 (SVG-OM1), fever, CVA, VDRF, C diff.   CVA (cerebral infarction)    a. 01/2013 in setting of prolonged hospitalization - residual L arm weakness.   Dressler syndrome (HCC)    a. 03/2013: readm with hypoxia, tachycardia, elevated ESR/CRP, elevated troponin with Dressler syndrome and myopericarditis; treated with steroids.   Glaucoma    Hyperlipidemia    Hypokalemia    Hyponatremia    a. During late 2014.   Ischemic cardiomyopathy    a. Sept/Oct 2014: EF ~40% (ICM). b. 03/2013: EF 25-30%. Off ACEI due to hypotension (MIXED NICM/ICM).   Optic neuropathy, ischemic    Pericardial effusion    a. 01/2013: pericardial effusion (presumed purulent pericarditis)  and cardiac tamponade s/p drain. b. Persistent moderate pericardial effusion 03/2013.   Pre-diabetes    Pseudoaneurysm of left ventricle of heart    a. 01/2013:  Ruptured inferoposterior LV pseudoaneurysm s/p CorMatrix patch.   Sinus bradycardia    asymptomatic   Stroke [facility]) 2009   weak lt side-lt arm   Tobacco abuse    Wears  dentures    top    Past Surgical History:  Procedure Laterality Date   CARDIAC CATHETERIZATION  01/06/2013   CATARACT EXTRACTION Bilateral 05/2021   COLONOSCOPY WITH PROPOFOL N/A 07/03/2016   Procedure: COLONOSCOPY WITH PROPOFOL;  Surgeon: Ruffin Frederick, MD;  Location: [facility];  Service: Gastroenterology;  Laterality: N/A;   CORONARY ANGIOPLASTY WITH STENT PLACEMENT  2000's X 2   "1 + 1" (01/06/2013)   CORONARY ARTERY BYPASS GRAFT N/A 01/28/2013   Procedure: CORONARY ARTERY BYPASS GRAFTING (CABG);  Surgeon: Loreli Slot, MD;  Location: [facility] OR;  Service: Open Heart Surgery;  Laterality: N/A;  CABG x one, using left greater saphenous vein harvested endoscopically   ICD IMPLANT N/A 05/25/2020   Procedure: ICD IMPLANT;  Surgeon: Hillis Range, MD;  Location: [facility] INVASIVE CV LAB;  Service: Cardiovascular;  Laterality: N/A;   INTRAOPERATIVE TRANSESOPHAGEAL ECHOCARDIOGRAM N/A 01/28/2013   Procedure: INTRAOPERATIVE TRANSESOPHAGEAL ECHOCARDIOGRAM;  Surgeon: Loreli Slot, MD;  Location: [facility] OR;  Service: Open Heart Surgery;  Laterality: N/A;   IR CT HEAD LTD  10/03/2022   IR PERCUTANEOUS ART THROMBECTOMY/INFUSION INTRACRANIAL INC DIAG ANGIO  10/03/2022   IR US GUIDE VASC ACCESS RIGHT  10/03/2022   LEFT HEART CATH AND CORS/GRAFTS ANGIOGRAPHY N/A 09/13/2020   Procedure: LEFT HEART CATH AND CORS/GRAFTS ANGIOGRAPHY;  Surgeon: Swaziland, Peter M, MD;  Location: [facility] INVASIVE CV LAB;  Service: Cardiovascular;  Laterality: N/A;   LEFT HEART CATHETERIZATION WITH CORONARY ANGIOGRAM N/A 01/06/2013   Procedure: LEFT HEART CATHETERIZATION WITH CORONARY ANGIOGRAM;  Surgeon: Peter M Swaziland, MD;  Location: [facility] CATH LAB;  Service: Cardiovascular;  Laterality: N/A;   LEFT HEART CATHETERIZATION WITH CORONARY ANGIOGRAM N/A 01/21/2013   Procedure: LEFT HEART CATHETERIZATION WITH CORONARY ANGIOGRAM;  Surgeon: Kathleene Hazel, MD;  Location: [facility] CATH LAB;  Service: Cardiovascular;  Laterality: N/A;    LUMBAR LAMINECTOMY/ DECOMPRESSION WITH MET-RX Right 06/30/2018   Procedure: Right minimally invasive Lumbar Three-Four Far lateral discectomy;  Surgeon: Jadene Pierini, MD;  Location: [facility] OR;  Service: Neurosurgery;  Laterality: Right;  Right minimally invasive Lumbar Three-Four Far lateral discectomy   MASS EXCISION Right 07/21/2014   Procedure: EXCISION RIGHT NECK MASS;  Surgeon: Harriette Bouillon, MD;  Location: [facility];  Service: General;  Laterality: Right;   PERICARDIAL TAP N/A 01/24/2013   Procedure: PERICARDIAL TAP;  Surgeon: Micheline Chapman, MD;  Location: [facility] CATH LAB;  Service: Cardiovascular;  Laterality: N/A;   RADIOLOGY WITH ANESTHESIA N/A 10/03/2022   Procedure: IR WITH ANESTHESIA;  Surgeon: Julieanne Cotton, MD;  Location: [facility] OR;  Service: Radiology;  Laterality: N/A;   RIGHT HEART CATHETERIZATION Right 01/24/2013   Procedure: RIGHT HEART CATH;  Surgeon: Micheline Chapman, MD;  Location: [facility] CATH LAB;  Service: Cardiovascular;  Laterality: Right;   VENTRICULAR ANEURYSM RESECTION N/A 01/28/2013   Procedure: LEFT VENTRICULAR ANEURYSM REPAIR;  Surgeon: Loreli Slot, MD;  Location: [facility] OR;  Service: Open Heart Surgery;  Laterality: N/A;     Home Medications:  Prior to Admission medications   Medication Sig Start Date End Date Taking? Authorizing Provider  ALPHAGAN P 0.1 %  SOLN INSTILL 1 DROP INTO BOTH EYES TWICE A DAY Patient taking differently: Place 1 drop into both eyes in the morning and at bedtime. 10/26/19  Yes [name], MD  aspirin 81 MG EC tablet Take 1 tablet (81 mg total) by mouth daily. Restart on 07/05/2018 06/30/18  Yes Jadene Pierini, MD  atorvastatin (LIPITOR) 80 MG tablet Take 1 tablet (80 mg total) by mouth daily. 06/07/22  Yes [name], MD  carvedilol (COREG) 25 MG tablet Take 0.5 tablets (12.5 mg total) by mouth 2 (two) times daily with a meal. Patient taking differently: Take 25 mg by mouth 2 (two) times daily with  a meal. 05/08/22  Yes [name], MD  Cholecalciferol (VITAMIN D3) 50 MCG (2000 UT) TABS Take 1 tablet by mouth daily.   Yes [provider]  clopidogrel (PLAVIX) 75 MG tablet Take 1 tablet (75 mg total) by mouth daily. 06/20/22  Yes [name], MD  dapagliflozin propanediol (FARXIGA) 10 MG TABS tablet TAKE 1 TABLET EVERY DAY Patient taking differently: Take 10 mg by mouth daily. 12/20/21  Yes Allred, Fayrene Fearing, MD  docusate sodium (COLACE) 100 MG capsule Take 100 mg by mouth 2 (two) times daily.   Yes [provider]  ENTRESTO 97-103 MG TAKE 1 TABLET TWICE DAILY Patient taking differently: Take 1 tablet by mouth 2 (two) times daily. 05/02/22  Yes Swaziland, Peter M, MD  furosemide (LASIX) 20 MG tablet Take 1 tablet (20 mg total) by mouth daily. 02/01/21  Yes Swaziland, Peter M, MD  gabapentin (NEURONTIN) 300 MG capsule Take 1 capsule (300 mg total) by mouth at bedtime. 09/18/21  Yes [name], MD  latanoprost (XALATAN) 0.005 % ophthalmic solution Place 1 drop into both eyes at bedtime. 05/31/16  Yes [provider]  Multiple Vitamins-Minerals (CENTRUM MEN) TABS Take 1 tablet by mouth daily.   Yes [provider]  spironolactone (ALDACTONE) 25 MG tablet Take 0.5 tablets (12.5 mg total) by mouth daily. 05/08/22  Yes [name], MD  fluticasone (FLONASE) 50 MCG/ACT nasal spray Place 1 spray into both nostrils daily. 08/12/21   Carlisle Beers, FNP  nitroGLYCERIN (NITROSTAT) 0.4 MG SL tablet Place 1 tablet (0.4 mg total) under the tongue every 5 (five) minutes x 3 doses as needed for chest pain. 09/14/20   Arty Baumgartner, NP    Inpatient Medications: Scheduled Meds:  atorvastatin  80 mg Per Tube Daily   carvedilol  12.5 mg Per Tube BID WC   Chlorhexidine Gluconate Cloth  6 each Topical Daily   feeding supplement (PROSource TF20)  60 mL Per Tube Daily   furosemide  40 mg Per Tube Daily   gabapentin  300 mg Per Tube QHS   insulin aspart  0-9  Units Subcutaneous Q4H   multivitamin with minerals  1 tablet Per Tube Daily   nicotine  7 mg Transdermal Daily   pantoprazole (PROTONIX) IV  40 mg Intravenous QHS   polyethylene glycol  17 g Per Tube BID   sacubitril-valsartan  1 tablet Per Tube BID   senna-docusate  1 tablet Per Tube BID   spironolactone  12.5 mg Per Tube Daily   Continuous Infusions:   ceFAZolin (ANCEF) IV 2 g (10/12/22 1339)   feeding supplement (OSMOLITE 1.5 CAL) 65 mL/hr at 10/12/22 1200   heparin 1,200 Units/hr (10/12/22 1200)   PRN Meds: acetaminophen **OR** acetaminophen (TYLENOL) oral liquid 160 mg/5 mL **OR** acetaminophen, metoprolol tartrate, ondansetron (ZOFRAN) IV, mouth  rinse, senna-docusate  Allergies:   No Known Allergies  Social History:   Social History   Socioeconomic History   Marital status: Single    Spouse name: Not on file   Number of children: 2   Years of education: 12th   Highest education level: Not on file  Occupational History   Occupation: N/A  Tobacco Use   Smoking status: Every Day    Packs/day: 1.00    Years: 35.00    Additional pack years: 0.00    Total pack years: 35.00    Types: Cigarettes   Smokeless tobacco: Never  Vaping Use   Vaping Use: Never used  Substance and Sexual Activity   Alcohol use: No    Alcohol/week: 0.0 standard drinks of alcohol   Drug use: No   Sexual activity: Yes  Other Topics Concern   Not on file  Social History Narrative   Lives in [place] with wife.  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  He is now retired on disability.  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      Caffeine Use: very rarely   Social Determinants of Health   Financial Resource Strain: Not on file  Food Insecurity: Patient Unable To Answer (10/11/2022)   Hunger Vital Sign    Worried About Running Out of Food in the Last Year: Patient unable to answer    Ran Out of Food in the Last Year: Patient unable to answer  Transportation Needs: Patient Unable  To Answer (10/11/2022)   PRAPARE - Transportation    Lack of Transportation (Medical): Patient unable to answer    Lack of Transportation (Non-Medical): Patient unable to answer  Physical Activity: Not on file  Stress: Not on file  Social Connections: Not on file  Intimate Partner Violence: Patient Unable To Answer (10/11/2022)   Humiliation, Afraid, Rape, and Kick questionnaire    Fear of Current or Ex-Partner: Patient unable to answer    Emotionally Abused: Patient unable to answer    Physically Abused: Patient unable to answer    Sexually Abused: Patient unable to answer    Family History:   Family History  Problem Relation Age of Onset   Heart attack Mother    Diabetes Mother    Hypertension Mother    Hypertension Father    CAD Other    HIV Brother    Stroke Neg Hx      ROS:  Please see the history of present illness.  All other ROS reviewed and negative.     Physical Exam/Data:   Vitals:   10/12/22 0900 10/12/22 1000 10/12/22 1100 10/12/22 1200  BP: (!) 147/93 121/84 125/89 102/89  Pulse: 77 66 74 74  Resp: 17 (!) 22 (!) 22 (!) 25  Temp:    100.3 F (37.9 C)  TempSrc:    Axillary  SpO2: 94% (!) 89% 96% 97%  Weight:      Height:        Intake/Output Summary (Last 24 hours) at 10/12/2022 1339 Last data filed at 10/12/2022 1200 Gross per 24 hour  Intake 2197.14 ml  Output 1200 ml  Net 997.14 ml      10/12/2022    7:00 AM 10/10/2022    5:00 AM 10/09/2022    5:00 AM  Last 3 Weights  Weight (lbs) 237 lb 7 oz 237 lb 7 oz 238 lb 12.1 oz  Weight (kg) 107.7 kg 107.7 kg 108.3 kg  Body mass index is 31.33 kg/m.  General:  Well nourished, though thin, getting bathed, appears in no distress HEENT: normal Neck: no JVD Vascular: No carotid bruits Cardiac:  RRR; soft SM, no gallops or rubs Lungs:  CTA b/l, no wheezing, rhonchi or rales  Abd: soft Ext: no edema Musculoskeletal:  No deformities Skin: warm and dry  Neuro:  not examined, deferred to neuro  attending Psych: unable to assess    EKG:  The EKG was personally reviewed and demonstrates:    SR 63bpm, 1st degree AVblock  Telemetry:  Telemetry was personally reviewed and demonstrates:  SR  Relevant CV Studies:  Echocardiogram 10/07/2022 1. There is a large inferior pseudoaneurysm with a depth of 3 cm and a maximum width of roughly 5 cm. The "neck" at the level of the inferior wall measures 2.3 cm. The wall of the pseudoaneurysm is made up of a layer of thrombus that is roughly 1.5 cm thick. No mobile thrombus is seen.. Left ventricular ejection fraction, by estimation, is 30 to 35%. The left ventricle has moderately decreased function. The left ventricle demonstrates regional wall motion abnormalities (see scoring diagram/findings for description). The left ventricular internal cavity size was mildly dilated. There is akinesis of the left ventricular, basal-mid inferior wall and inferolateral wall. There is severe hypokinesis of the left ventricular, basal-mid inferoseptal wall. There is severe hypokinesis of the left ventricular, basal-mid lateral wall. There is peudoaneurysm of the left ventricular, basal inferior wall. Conclusion(s)/Recommendation(s): Findings are highly concerning for redevelopment of pseudoaneurysm of the inferior wall that was repaired in 2014. There appears to be dehiscence of the patch used to correct abnormality, with development of new pseudoaneurysm. No protruding or mobile thrombi are seen in the pseudoaneurysm, but a thick layer of thrombus makes up the external wall of the pseudoaneurysm. Recommend CT angiography or MR angiography of the heart for precise definition.     CARDIAC CTA 10/10/2022 IMPRESSION: 1. 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 Large burden of thrombus on top of patch repair laminated but measuring as thick as 1.6 cm 3.  No pericardial effusion 4. Severe bi ventricular failure with D shaped septum  indicating elevated PA pressures 5.  Occluded SVG to OM 6.  Pacing wires noted in RA/RV 7. Normal ascending thoracic aorta 3.4 cm with no evidence of dissection 8.  Chicken wing appendage with no thrombus present  Laboratory Data:  High Sensitivity Troponin:   Recent Labs  Lab 10/06/22 1032 10/06/22 1257 10/06/22 1453 10/06/22 1748  TROPONINIHS 45* 43* 39* 35*     Chemistry Recent Labs  Lab 10/06/22 0231 10/07/22 0817 10/09/22 1018 10/10/22 0455 10/11/22 0353 10/12/22 0501  NA 148*   < > 139 135 133* 132*  K 3.9   < > 4.2 3.7 3.5 3.9  CL 117*   < > 106 101 98 99  CO2 22   < > 20* 23 24 22   GLUCOSE 145*   < > 114* 161* 138* 117*  BUN 13   < > 18 21 24* 20  CREATININE 0.96   < > 0.98 0.97 0.96 0.93  CALCIUM 8.9   < > 8.9 8.5* 8.1* 8.5*  MG 2.1  --  2.2  --   --   --   GFRNONAA >60   < > >60 >60 >60 >60  ANIONGAP 9   < > 13 11 11 11    < > = values in this interval not displayed.  Recent Labs  Lab 10/09/22 1751  PROT 6.5  ALBUMIN 3.1*  AST 36  ALT 24  ALKPHOS 82  BILITOT 1.9*   Lipids No results for input(s): "CHOL", "TRIG", "HDL", "LABVLDL", "LDLCALC", "CHOLHDL" in the last 168 hours.  Hematology Recent Labs  Lab 10/10/22 0455 10/11/22 0353 10/12/22 0501  WBC 10.2 9.8 10.2  RBC 4.55 4.72 5.08  HGB 13.3 13.7 15.3  HCT 38.5* 40.8 43.6  MCV 84.6 86.4 85.8  MCH 29.2 29.0 30.1  MCHC 34.5 33.6 35.1  RDW 12.7 12.6 12.5  PLT 172 190 178   Thyroid No results for input(s): "TSH", "FREET4" in the last 168 hours.  BNPNo results for input(s): "BNP", "PROBNP" in the last 168 hours.  DDimer No results for input(s): "DDIMER" in the last 168 hours.   Radiology/Studies:  CT HEAD WO CONTRAST ( ) Result Date: 10/12/2022 CLINICAL DATA:  62 year old male code stroke presentation on 10/02/2022, left MCA M2 occlusion. Endovascular reperfusion. Bilateral parenchymal hematoma and subarachnoid hemorrhage. Subsequent encounter. EXAM: CT HEAD WITHOUT CONTRAST TECHNIQUE:  Contiguous axial images were obtained from the base of the skull through the vertex without intravenous contrast. RADIATION DOSE REDUCTION: This exam was performed according to the departmental dose-optimization program which includes automated exposure control, adjustment of the mA and/or kV according to patient size and/or use of iterative reconstruction technique. COMPARISON:  Head CTs 10/09/2022 and earlier. FINDINGS: Brain: Multifocal ill-defined hyperdense hemorrhage now in the posterior left hemisphere affecting the posterior left frontal lobe, left parietal lobe, and portions of the left occipital lobe. Lateral dorsal thalamic involvement redemonstrated. When comparing coronal series, this hemorrhage has not significantly changed in size or configuration since 10/09/2022. Similar contralateral indistinct right occipital lobe hemorrhage (series 4, image 14) likewise is stable in size and configuration. Although some of these blood products have faded slightly since 10/09/2022 (coronal image 52). Similar indistinct and somewhat petechial bilateral cerebellar hemorrhage has been present since 10/06/2022 and not significantly changed since that time (series 4, image 9). Widespread bilateral hemisphere hypodensity appears to be a combination of edema and encephalomalacia. Superimposed subarachnoid blood has regressed since 10/03/2022. And intraventricular blood has largely resolved since that time. Basilar cisterns remain patent. No ventriculomegaly. Supratentorial mass effect with mild rightward midline shift of up to 3 mm is stable since 10/09/2022. Vascular: No suspicious intracranial vascular hyperdensity. Skull: Stable, intact. Sinuses/Orbits: Visualized paranasal sinuses and mastoids are stable and well aerated. Other: Left nasoenteric tube in place.  Stable orbit and scalp. IMPRESSION: 1. Extensive bilateral cerebral and cerebellar hemisphere petechial and infiltrative intra-axial hemorrhage is stable  since 10/09/2022. The largest confluent hematoma is in the left posterior MCA territory. SAH and IVH have regressed since 10/03/2022. 2. Combination of brain edema and encephalomalacia superimposed on the above does not appear significantly changed. 3. Stable subsequent intracranial mass effect since 10/09/2022 with up to 3 mm of rightward midline shift, patent basilar cisterns. 4. No new intracranial abnormality identified. Electronically Signed   By: Odessa Fleming M.D.   On: 10/12/2022 04:43     Assessment and Plan:   Staph bacteremia ICD in place Hx of 2014 status post repair of rupture of inferoposterior left ventricular pseudoaneurysm with CorMatrix patch  LV thrombus       Started on heparin gtt today  Very unfortunate situation Not currently a candidate for ICD system extraction.  Dr. Lalla Brothers has seen the patient     Risk Assessment/Risk Scores:    For questions or updates, please contact [facility]  Please consult [email or web address] for contact info under    Signed, Sheilah Pigeon, PA-C  10/12/2022 1:39 PM

## 2022-10-12 NOTE — Progress Notes (Signed)
ANTICOAGULATION CONSULT NOTE - Initial Consult  Pharmacy Consult for heparin Indication: Ventricular pseudoaneurysm  No Known Allergies  Patient Measurements: Height: 6\' 1"  (185.4 cm) Weight: 107.7 kg (237 lb 7 oz) IBW/kg (Calculated) : 79.9 Heparin Dosing Weight: 101kg  Vital Signs: Temp: 99.7 F (37.6 C) (06/28 1544) Temp Source: Axillary (06/28 1544) BP: 139/91 (06/28 1513) Pulse Rate: 69 (06/28 1513)  Labs: Recent Labs    10/10/22 0455 10/11/22 0353 10/12/22 0501 10/12/22 1547  HGB 13.3 13.7 15.3  --   HCT 38.5* 40.8 43.6  --   PLT 172 190 178  --   HEPARINUNFRC  --   --   --  <0.10*  CREATININE 0.97 0.96 0.93  --   CKTOTAL 490*  --   --   --      Estimated Creatinine Clearance: 107.4 mL/min (by C-G formula based on SCr of 0.93 mg/dL).   Medical History: Past Medical History:  Diagnosis Date   Abnormal EKG    hx of ischemia showing up on ekg's   Acute myopericarditis    a. 03/2013: readm with hypoxia, tachycardia, elevated ESR/CRP, elevated troponin with Dressler syndrome and myopericarditis; treated with steroids.   Acute respiratory failure (HCC)    a. 01/2013: VDRF. b. 03/2013: hypoxia requiring supp O2 during adm, resolved by discharge.   C. difficile colitis    a. 01/2013 during prolonged adm. b. Recurred 03/2013.   Cardiac tamponade    a. 01/2013 s/p drain.   Cataracts, bilateral    Coronary artery disease    a. s/p MI in 2009 in [place] with stenting of the LCX and LAD;  b. 12/2012 NSTEMI/CAD: LM nl, LAD patent prox stent, LCX 50-70 isr (FFR 0.84), RCA dom, 1102m, EF 40-45%-->Med Rx. c. 01/2013: anterolateral STEMI complicated by pericardial effusion (presumed purulent pericarditis) and tamponade s/p drain, ruptured LV pseudoaneurysm s/p CorMatrix patch, CABGx1 (SVG-OM1), fever, CVA, VDRF, C diff.   CVA (cerebral infarction)    a. 01/2013 in setting of prolonged hospitalization - residual L arm weakness.   Dressler syndrome (HCC)    a. 03/2013: readm with  hypoxia, tachycardia, elevated ESR/CRP, elevated troponin with Dressler syndrome and myopericarditis; treated with steroids.   Glaucoma    Hyperlipidemia    Hypokalemia    Hyponatremia    a. During late 2014.   Ischemic cardiomyopathy    a. Sept/Oct 2014: EF ~40% (ICM). b. 03/2013: EF 25-30%. Off ACEI due to hypotension (MIXED NICM/ICM).   Optic neuropathy, ischemic    Pericardial effusion    a. 01/2013: pericardial effusion (presumed purulent pericarditis) and cardiac tamponade s/p drain. b. Persistent moderate pericardial effusion 03/2013.   Pre-diabetes    Pseudoaneurysm of left ventricle of heart    a. 01/2013:  Ruptured inferoposterior LV pseudoaneurysm s/p CorMatrix patch.   Sinus bradycardia    asymptomatic   Stroke [facility]) 2009   weak lt side-lt arm   Tobacco abuse    Wears dentures    top   Assessment: 33 YOM with hx LV pseudoaneurysm s/p patch now with large thrombus burden, admitted with ischemic stroke with hemorrhagic conversion, CT stable today and pharmacy consulted to cautiously start heparin gtt for LV pseudoaneurysm, no bolus with low end goals.  Has been on SQ heparin for DVT ppx with last dose 6/28 @0500 .  Initial heparin level undetectable. No issues with heparin running and no neuro exam changes per d/w RN.   Goal of Therapy:  Heparin level 0.3-0.5 units/ml Monitor platelets by anticoagulation protocol:  Yes   Plan:  Increase heparin gtt to 1400 units/hr, no bolus F/u 6 hour heparin level Daily heparin level, CBC, s/s bleeding  Rexford Maus, PharmD, BCPS 10/12/2022 4:29 PM

## 2022-10-12 NOTE — TOC Progression Note (Signed)
Transition of Care [facility]) - Progression Note    Patient Details  Name: [name] MRN: [id number] Date of Birth: 05/30/1960  Transition of Care [facility]) CM/SW Contact  Mearl Latin, LCSW Phone Number: 10/12/2022, 1:16 PM  Clinical Narrative:    CSW continuing to follow.    Expected Discharge Plan: Skilled Nursing Facility Barriers to Discharge: Continued Medical Work up, English as a second language teacher, SNF Pending bed offer  Expected Discharge Plan and Services In-house Referral: Clinical Social Work     Living arrangements for the past 2 months: Apartment                                       Social Determinants of Health (SDOH) Interventions SDOH Screenings   Food Insecurity: Patient Unable To Answer (10/11/2022)  Housing: Patient Unable To Answer (10/11/2022)  Transportation Needs: Patient Unable To Answer (10/11/2022)  Utilities: Patient Unable To Answer (10/11/2022)  Depression (PHQ2-9): Low Risk  (05/08/2022)  Tobacco Use: High Risk (10/04/2022)    Readmission Risk Interventions     No data to display

## 2022-10-12 NOTE — Progress Notes (Signed)
Physical Therapy Treatment Patient Details Name: [name] MRN: [id number] DOB: 28-Apr-1960 Today's Date: 10/12/2022   History of Present Illness Patient is a 62 y/o male admitted 10/03/22 with L weakness and numbness.  Received TNK then developed now symptoms of aphasia and R weakness.  He underwent mechanical thrombectomy L M3 segment MCA noted to have small hemorrhage and follow up CT showed progression of hemorrhage 3cm then 7cm and small acute R cerebellar infarct. PMH positive for CAD s/p PCI w/stent and 1v CABG, multiple CGAs, systolic HF s/p ICD, HLD, HTN.    PT Comments    Patient with very slow progress.  Somewhat fatigued after just back to bed after up in chair 4 hours.  Still working on sitting balance with ability to sit with S for a bit.  Did not respond well to attempts for sit to stand to [facility] despite multimodal cues.  Possibly could try more mechanical standing lift.  PT will continue to follow.  Will need post-acute inpatient rehab < 3 hours/day.    Recommendations for follow up therapy are one component of a multi-disciplinary discharge planning process, led by the attending physician.  Recommendations may be updated based on patient status, additional functional criteria and insurance authorization.  Follow Up Recommendations  Can patient physically be transported by private vehicle: No    Assistance Recommended at Discharge    Patient can return home with the following Two people to help with walking and/or transfers;A lot of help with bathing/dressing/bathroom;Assistance with cooking/housework;Direct supervision/assist for medications management;Assist for transportation;Help with stairs or ramp for entrance;Assistance with feeding   Equipment Recommendations  Other (comment) (TBA)    Recommendations for Other Services       Precautions / Restrictions Precautions Precautions: Fall Precaution Comments: R inattention; SBP <160     Mobility  Bed  Mobility Overal bed mobility: Needs Assistance Bed Mobility: Rolling, Sidelying to Sit Rolling: Mod assist Sidelying to sit: +2 for physical assistance, Max assist   Sit to supine: Max assist, +2 for physical assistance   General bed mobility comments: lifting help for trunk to sit on EOB; to supine assist for legs and trunk    Transfers Overall transfer level: Needs assistance   Transfers: Sit to/from Stand Sit to Stand: Total assist, +2 physical assistance, +2 safety/equipment, From elevated surface           General transfer comment: attempted x 4 sit to stand to [facility] but pt not placing weight on his feet despite multimodal cues and support, Transfer via Lift Equipment: Stedy  Ambulation/Gait                   Stairs             Wheelchair Mobility    Modified Rankin (Stroke Patients Only) Modified Rankin (Stroke Patients Only) Pre-Morbid Rankin Score: Moderate disability Modified Rankin: Severe disability     Balance Overall balance assessment: Needs assistance Sitting-balance support: Feet supported, Single extremity supported Sitting balance-Leahy Scale: Poor Sitting balance - Comments: on EOB initially R lateral lean needing mod A for balance, can sit with S once head assisted to midline and keeping L foot to L Postural control: Right lateral lean Standing balance support: Bilateral upper extremity supported Standing balance-Leahy Scale: Zero Standing balance comment: Unable to achieve full erect standing posture.                            Cognition  Arousal/Alertness: Awake/alert Behavior During Therapy: Flat affect, Restless Overall Cognitive Status: Impaired/Different from baseline Area of Impairment: Attention, Safety/judgement, Awareness, Following commands, Problem solving                   Current Attention Level: Focused   Following Commands: Follows one step commands inconsistently, Follows one step commands  with increased time Safety/Judgement: Decreased awareness of safety, Decreased awareness of deficits   Problem Solving: Slow processing          Exercises      General Comments General comments (skin integrity, edema, etc.): sister in law in the room; noted rectal pouch leaking so RN in to assist for hygiene and to change tube.      Pertinent Vitals/Pain Pain Assessment Pain Assessment: Faces Faces Pain Scale: Hurts a little bit Pain Location: generalized with mobility Pain Descriptors / Indicators: Grimacing, Discomfort Pain Intervention(s): Monitored during session    Home Living                          Prior Function            PT Goals (current goals can now be found in the care plan section) Progress towards PT goals: Progressing toward goals    Frequency    Min 3X/week      PT Plan Current plan remains appropriate    Co-evaluation              AM-PAC PT "6 Clicks" Mobility   Outcome Measure  Help needed turning from your back to your side while in a flat bed without using bedrails?: Total Help needed moving from lying on your back to sitting on the side of a flat bed without using bedrails?: Total Help needed moving to and from a bed to a chair (including a wheelchair)?: Total Help needed standing up from a chair using your arms (e.g., wheelchair or bedside chair)?: Total Help needed to walk in hospital room?: Total Help needed climbing 3-5 steps with a railing? : Total 6 Click Score: 6    End of Session Equipment Utilized During Treatment: Gait belt Activity Tolerance: Patient limited by fatigue Patient left: in bed;with call bell/phone within reach;with bed alarm set;with family/visitor present   PT Visit Diagnosis: Other abnormalities of gait and mobility (R26.89);Other symptoms and signs involving the nervous system (R29.898);Hemiplegia and hemiparesis Hemiplegia - Right/Left: Right Hemiplegia - dominant/non-dominant:  Dominant Hemiplegia - caused by: Cerebral infarction;Other Nontraumatic intracranial hemorrhage     Time: 1340-1425 PT Time Calculation (min) (ACUTE ONLY): 45 min  Charges:  $Therapeutic Activity: 38-52 mins                     Sheran Lawless, PT Acute Rehabilitation Services [phone] 10/12/2022    Elray Mcgregor 10/12/2022, 6:23 PM

## 2022-10-12 NOTE — Progress Notes (Signed)
ANTICOAGULATION CONSULT NOTE - Initial Consult  Pharmacy Consult for heparin Indication: Ventricular pseudoaneurysm  No Known Allergies  Patient Measurements: Height: 6\' 1"  (185.4 cm) Weight: 107.7 kg (237 lb 7 oz) IBW/kg (Calculated) : 79.9 Heparin Dosing Weight: 101kg  Vital Signs: Temp: 101.2 F (38.4 C) (06/28 0759) Temp Source: Axillary (06/28 0759) BP: 147/93 (06/28 0900) Pulse Rate: 77 (06/28 0900)  Labs: Recent Labs    10/09/22 1018 10/09/22 1033 10/10/22 0455 10/11/22 0353 10/12/22 0501  HGB  --    < > 13.3 13.7 15.3  HCT  --    < > 38.5* 40.8 43.6  PLT  --    < > 172 190 178  CREATININE 0.98  --  0.97 0.96 0.93  CKTOTAL 610*  --  490*  --   --    < > = values in this interval not displayed.    Estimated Creatinine Clearance: 107.4 mL/min (by C-G formula based on SCr of 0.93 mg/dL).   Medical History: Past Medical History:  Diagnosis Date   Abnormal EKG    hx of ischemia showing up on ekg's   Acute myopericarditis    a. 03/2013: readm with hypoxia, tachycardia, elevated ESR/CRP, elevated troponin with Dressler syndrome and myopericarditis; treated with steroids.   Acute respiratory failure (HCC)    a. 01/2013: VDRF. b. 03/2013: hypoxia requiring supp O2 during adm, resolved by discharge.   C. difficile colitis    a. 01/2013 during prolonged adm. b. Recurred 03/2013.   Cardiac tamponade    a. 01/2013 s/p drain.   Cataracts, bilateral    Coronary artery disease    a. s/p MI in 2009 in [place] with stenting of the LCX and LAD;  b. 12/2012 NSTEMI/CAD: LM nl, LAD patent prox stent, LCX 50-70 isr (FFR 0.84), RCA dom, 181m, EF 40-45%-->Med Rx. c. 01/2013: anterolateral STEMI complicated by pericardial effusion (presumed purulent pericarditis) and tamponade s/p drain, ruptured LV pseudoaneurysm s/p CorMatrix patch, CABGx1 (SVG-OM1), fever, CVA, VDRF, C diff.   CVA (cerebral infarction)    a. 01/2013 in setting of prolonged hospitalization - residual L arm weakness.    Dressler syndrome (HCC)    a. 03/2013: readm with hypoxia, tachycardia, elevated ESR/CRP, elevated troponin with Dressler syndrome and myopericarditis; treated with steroids.   Glaucoma    Hyperlipidemia    Hypokalemia    Hyponatremia    a. During late 2014.   Ischemic cardiomyopathy    a. Sept/Oct 2014: EF ~40% (ICM). b. 03/2013: EF 25-30%. Off ACEI due to hypotension (MIXED NICM/ICM).   Optic neuropathy, ischemic    Pericardial effusion    a. 01/2013: pericardial effusion (presumed purulent pericarditis) and cardiac tamponade s/p drain. b. Persistent moderate pericardial effusion 03/2013.   Pre-diabetes    Pseudoaneurysm of left ventricle of heart    a. 01/2013:  Ruptured inferoposterior LV pseudoaneurysm s/p CorMatrix patch.   Sinus bradycardia    asymptomatic   Stroke [facility]) 2009   weak lt side-lt arm   Tobacco abuse    Wears dentures    top   Assessment: 2 YOM with hx LV pseudoaneurysm s/p patch now with large thrombus burden, admitted with ischemic stroke with hemorrhagic conversion, CT stable today and pharmacy consulted to cautiously start heparin gtt for LV pseudoaneurysm, no bolus with low end goals.  Has been on SQ heparin for DVT ppx with last dose 6/28 @0500   Goal of Therapy:  Heparin level 0.3-0.5 units/ml Monitor platelets by anticoagulation protocol: Yes   Plan:  Heparin gtt at 1200 units/hr, no bolus F/u 6 hour heparin level Daily heparin level, CBC, s/s bleeding  Daylene Posey, PharmD, [facility] Clinical Pharmacist ED Pharmacist Phone # [phone] 10/12/2022 9:43 AM

## 2022-10-12 NOTE — Progress Notes (Signed)
ANTICOAGULATION CONSULT NOTE - Follow Up Consult  Pharmacy Consult for heparin Indication: Ventricular pseudoaneurysm  No Known Allergies  Patient Measurements: Height: 6\' 1"  (185.4 cm) Weight: 107.7 kg (237 lb 7 oz) IBW/kg (Calculated) : 79.9 Heparin Dosing Weight: 101kg  Vital Signs: Temp: 99.3 F (37.4 C) (06/28 2000) Temp Source: Axillary (06/28 2000) BP: 140/72 (06/28 2000) Pulse Rate: 59 (06/28 2000)  Labs: Recent Labs    10/10/22 0455 10/11/22 0353 10/12/22 0501 10/12/22 1547 10/12/22 2239  HGB 13.3 13.7 15.3  --   --   HCT 38.5* 40.8 43.6  --   --   PLT 172 190 178  --   --   HEPARINUNFRC  --   --   --  <0.10* 0.48  CREATININE 0.97 0.96 0.93  --   --   CKTOTAL 490*  --   --   --   --      Estimated Creatinine Clearance: 107.4 mL/min (by C-G formula based on SCr of 0.93 mg/dL).   Medical History: Past Medical History:  Diagnosis Date   Abnormal EKG    hx of ischemia showing up on ekg's   Acute myopericarditis    a. 03/2013: readm with hypoxia, tachycardia, elevated ESR/CRP, elevated troponin with Dressler syndrome and myopericarditis; treated with steroids.   Acute respiratory failure (HCC)    a. 01/2013: VDRF. b. 03/2013: hypoxia requiring supp O2 during adm, resolved by discharge.   C. difficile colitis    a. 01/2013 during prolonged adm. b. Recurred 03/2013.   Cardiac tamponade    a. 01/2013 s/p drain.   Cataracts, bilateral    Coronary artery disease    a. s/p MI in 2009 in [place] with stenting of the LCX and LAD;  b. 12/2012 NSTEMI/CAD: LM nl, LAD patent prox stent, LCX 50-70 isr (FFR 0.84), RCA dom, 160m, EF 40-45%-->Med Rx. c. 01/2013: anterolateral STEMI complicated by pericardial effusion (presumed purulent pericarditis) and tamponade s/p drain, ruptured LV pseudoaneurysm s/p CorMatrix patch, CABGx1 (SVG-OM1), fever, CVA, VDRF, C diff.   CVA (cerebral infarction)    a. 01/2013 in setting of prolonged hospitalization - residual L arm weakness.    Dressler syndrome (HCC)    a. 03/2013: readm with hypoxia, tachycardia, elevated ESR/CRP, elevated troponin with Dressler syndrome and myopericarditis; treated with steroids.   Glaucoma    Hyperlipidemia    Hypokalemia    Hyponatremia    a. During late 2014.   Ischemic cardiomyopathy    a. Sept/Oct 2014: EF ~40% (ICM). b. 03/2013: EF 25-30%. Off ACEI due to hypotension (MIXED NICM/ICM).   Optic neuropathy, ischemic    Pericardial effusion    a. 01/2013: pericardial effusion (presumed purulent pericarditis) and cardiac tamponade s/p drain. b. Persistent moderate pericardial effusion 03/2013.   Pre-diabetes    Pseudoaneurysm of left ventricle of heart    a. 01/2013:  Ruptured inferoposterior LV pseudoaneurysm s/p CorMatrix patch.   Sinus bradycardia    asymptomatic   Stroke [facility]) 2009   weak lt side-lt arm   Tobacco abuse    Wears dentures    top   Assessment: 75 YOM with hx LV pseudoaneurysm s/p patch now with large thrombus burden, admitted with ischemic stroke with hemorrhagic conversion, CT stable today and pharmacy consulted to cautiously start heparin gtt for LV pseudoaneurysm, no bolus with low end goals.  Has been on SQ heparin for DVT ppx with last dose 6/28 @0500 .  Initial heparin level undetectable. No issues with heparin running and no neuro exam  changes per d/w RN.   Goal of Therapy:  Heparin level 0.3-0.5 units/ml Monitor platelets by anticoagulation protocol: Yes   Plan:  Increase heparin gtt to 1400 units/hr, no bolus F/u 6 hour heparin level Daily heparin level, CBC, s/s bleeding  Rexford Maus, PharmD, BCPS 10/12/2022 11:35 PM

## 2022-10-12 NOTE — Progress Notes (Signed)
ICM remote transmission rescheduled for 10/29/2022 due to patient currently hospitalized.

## 2022-10-12 NOTE — Progress Notes (Addendum)
STROKE TEAM PROGRESS NOTE   INTERVAL HISTORY Patient continues to have low-grade fevers, but his blood pressure remains stable.  2 out of 4 blood cultures now positive for Staph epidermidis May need TEE per ID. Still having Cheyne Stokes respirations. Head CT stable, start heparin today Repeat cultures pending  Tmax 101.2 for 24 hours  Vitals:   10/12/22 0400 10/12/22 0500 10/12/22 0600 10/12/22 0759  BP: 121/81 (!) 141/82 (!) 144/90   Pulse: 67 76 80   Resp: 15 (!) 32 (!) 40   Temp: 97.7 F (36.5 C)   (!) 101.2 F (38.4 C)  TempSrc: Axillary   Axillary  SpO2: 95% 91% 96%   Weight:      Height:       CBC:  Recent Labs  Lab 10/11/22 0353 10/12/22 0501  WBC 9.8 10.2  HGB 13.7 15.3  HCT 40.8 43.6  MCV 86.4 85.8  PLT 190 178    Basic Metabolic Panel:  Recent Labs  Lab 10/06/22 0231 10/07/22 0817 10/09/22 1018 10/10/22 0455 10/11/22 0353 10/12/22 0501  NA 148*   < > 139   < > 133* 132*  K 3.9   < > 4.2   < > 3.5 3.9  CL 117*   < > 106   < > 98 99  CO2 22   < > 20*   < > 24 22  GLUCOSE 145*   < > 114*   < > 138* 117*  BUN 13   < > 18   < > 24* 20  CREATININE 0.96   < > 0.98   < > 0.96 0.93  CALCIUM 8.9   < > 8.9   < > 8.1* 8.5*  MG 2.1  --  2.2  --   --   --    < > = values in this interval not displayed.     IMAGING past 24 hours CT HEAD WO CONTRAST ( )  Result Date: 10/12/2022 CLINICAL DATA:  62 year old male code stroke presentation on 10/02/2022, left MCA M2 occlusion. Endovascular reperfusion. Bilateral parenchymal hematoma and subarachnoid hemorrhage. Subsequent encounter. EXAM: CT HEAD WITHOUT CONTRAST TECHNIQUE: Contiguous axial images were obtained from the base of the skull through the vertex without intravenous contrast. RADIATION DOSE REDUCTION: This exam was performed according to the departmental dose-optimization program which includes automated exposure control, adjustment of the mA and/or kV according to patient size and/or use of iterative  reconstruction technique. COMPARISON:  Head CTs 10/09/2022 and earlier. FINDINGS: Brain: Multifocal ill-defined hyperdense hemorrhage now in the posterior left hemisphere affecting the posterior left frontal lobe, left parietal lobe, and portions of the left occipital lobe. Lateral dorsal thalamic involvement redemonstrated. When comparing coronal series, this hemorrhage has not significantly changed in size or configuration since 10/09/2022. Similar contralateral indistinct right occipital lobe hemorrhage (series 4, image 14) likewise is stable in size and configuration. Although some of these blood products have faded slightly since 10/09/2022 (coronal image 52). Similar indistinct and somewhat petechial bilateral cerebellar hemorrhage has been present since 10/06/2022 and not significantly changed since that time (series 4, image 9). Widespread bilateral hemisphere hypodensity appears to be a combination of edema and encephalomalacia. Superimposed subarachnoid blood has regressed since 10/03/2022. And intraventricular blood has largely resolved since that time. Basilar cisterns remain patent. No ventriculomegaly. Supratentorial mass effect with mild rightward midline shift of up to 3 mm is stable since 10/09/2022. Vascular: No suspicious intracranial vascular hyperdensity. Skull: Stable, intact. Sinuses/Orbits: Visualized paranasal sinuses and mastoids  are stable and well aerated. Other: Left nasoenteric tube in place.  Stable orbit and scalp. IMPRESSION: 1. Extensive bilateral cerebral and cerebellar hemisphere petechial and infiltrative intra-axial hemorrhage is stable since 10/09/2022. The largest confluent hematoma is in the left posterior MCA territory. SAH and IVH have regressed since 10/03/2022. 2. Combination of brain edema and encephalomalacia superimposed on the above does not appear significantly changed. 3. Stable subsequent intracranial mass effect since 10/09/2022 with up to 3 mm of rightward  midline shift, patent basilar cisterns. 4. No new intracranial abnormality identified. Electronically Signed   By: Odessa Fleming M.D.   On: 10/12/2022 04:43     Physical Exam  Constitutional: Appears well-developed and well-nourished.  Cardiovascular: Normal rate and regular rhythm.  Respiratory:  Cheyne-Stokes respiratory pattern GI: Soft.  No distension. There is no tenderness.  Skin: WDI  Neuro: Mental Status: Patient is awake, alert, speech is dysarthric with expressive aphasia. Unable to assess orientation  Follows simple midline commands Cranial Nerves: II: Pupils are equal, round, and reactive to light.   III,IV, VI: EOMI without ptosis or diplopia.  VII: Mild right facial droop  VIII: Hearing is intact to voice XII: tongue is midline without atrophy or fasciculations.  Motor: Tone is normal. Bulk is normal.  Moves left upper and lower extremities spontaneously, withdraws right lower extremity to noxious, slight movement of right upper extremity   ASSESSMENT/PLAN [name] is a 62 y.o. male with history of CAD s/p PCI w/ stent to LCS and LAD, s/p 1v CABG, multiple CVAs (R cerebellar 04/2022),  syscolic HF s/p ICD, HLD, HTN presenting with slurred speech and L sided weakness now s/p TNK and IR thrombectomy of L MCA (M3 segment) c/b  hemorrhage.   TIA - Right brain stroke aborted s/p TNK Stroke - Left MCA and right cerebellar infarcts left M3 occlusion s/p IR with TICIs, complicated by L frontoparietal ICH and SAH, etiology likely cardioembolic source  CT 6/18 no acute finding, but the bilateral frontal, parietal, occipital and cerebellum infarcts CTA head and neck unremarkable CTA head and neck repeat new short segment occlusion of distal left M2 S/p IR with left M3 TICIs reperfusion  post IR CT Hyperdensity within the left sylvian fissure may represent small subarachnoid hemorrhage versus contrast. CT Head: Progressed hemorrhage in L frontoparietal region w/ 3cm hematoma  and regional SAH MRI  progressive L fronto parietal hematoma (~7cm), regional SAH, increased mass effect.  Likely underlying infarct. Sm R cerebellar infarct, acute. Numerous chornic cerebral and cerebellar infarcts Repeat CT 6/19 increase in size of the left frontal/parietal hematoma. New extension into the left lateral ventricle. CT repeat 6/22 x 2 - New component of intraparenchymal hemorrhage in the posterior right hemisphere that measures approximately 2.5 x 1.4 cm. (On my review, more like right brain redistribution instead of new hemorrhage)  Repeat CT 6/23: Unchanged appearance of bilateral mixed intraparenchymal and subarachnoid hemorrhage. Trace rightward midline shift is unchanged. No hydrocephalus CT repeat 6/25 stable hemorrhage, unchanged CT Head 6/28-  stable hematoma. SAH and IVH have regressed since 10/03/2022.  Echo: Large inferior pseudoaneurysm with a depth of 3 cm and a maximum width of roughly 5 cm the wall of the pseudoaneurysm is made above the layer of thrombus that is roughly 1.5 cm thick. EF 30 to 35%, severe hypokinesis of left ventricle. No afib with ICD interrogation LE venous doppler neg for DVT TCD bubble study showed moderate sized PFO LDL 63 HgbA1c 5.8 UDS neg VTE prophylaxis -  SQH  aspirin 81 mg daily and clopidogrel 75 mg daily prior to admission, now on IV Heparin per stroke protocol Therapy recommendations:  SNF Disposition:  pending  CHF s/p ICD Systolic HF, s/p ICD 2/2 ICM.  Echo EF 30-35%  Trop 45 -> 43 -> 39 -> 35 CK 3714, CKMB 3.5 CXR 6/25 central vascular congestion and developing interstitial edema.  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  Cardiology on board, appreciate assistance  CTA of the heart intact chronic patch repair to inferior and inferior septal pseudoaneurysm, large burden of thrombus on top of patch repair, severe biventricular failure with D-shaped septum, occluded SVG to OM Heparin IV started 6/28 Ultimately patient will need to repeat undergo repeat pseudoaneurysm repair if he becomes healthy enough to tolerate surgery  Hyperlipidemia Home meds:  atorvastatin 80 LDL 63, goal < 70 Lipitor 80 resumed    Continue statin at discharge  Tobacco abuse Current smoker Smoking cessation counseling will be provided Nicotine patch provided  Fever ? Aspiration pneumonia Tmax 101.6 -> 99.8-> afebrile->100.8 -> 102-> 101.7-> 101.1 WBC 7.6->9.4->10.2->9.8->10.2 UA negative CXR central vascular congestion and developing interstitial edema.  CXR 6/26 similar appearance of atelectasis at both lung bases Procalc negative.  Blood culture 2/4 bottles staph epidermidis, ID consulted, appreciate recommendations Blood culture repeat pending NT suction PRN Ceftriaxone -> ancef ID consulted - pending repeat culture and recommend TEE eventually EP on board - not candidate for ICD extraction Cardiology felt pt not candidate for TEE now given respiratory distress  Dysphagia  Vomiting on zofran PRN, improved Pt ate well 6/24, nocturnal TF d/c'ed -> fever with possible aspiration -> NPO and TF restarted 6/27 having difficulty coordinating swallowing and respirations  continue NPO for now  PFO Known PFO in the past TCD bubble study showed a moderate-sized PFO LE venous Doppler negative for DVT  Not an emergent  issue at this time  Other stroke risk factors Obesity with BMI 32.23 CAD  Other Active Problems Thrombocytopenia, platelet 110->89-> 172-> 190-> stable CK 436 -> 3714 -> 610-> 490  Hospital day # 10  Patient seen and examined by NP/APP with MD. MD to update note as needed.   Elmer Picker, DNP, FNP-BC [facility] Pager: [phone]  ATTENDING NOTE: I reviewed above note and agree with the assessment and plan. Pt was seen and examined.   No family at bedside.  Patient lying in chair, drowsy sleepy, however arousable, mumbling, intangible, global aphasia, right hemiplegia.  Still has Cheyne-Stokes respiration pattern, difficulty with managing secretions, had NT suctioning, improved.  NT suctioning as needed.  Continue to have fever, 2/4 positive blood culture, repeat blood culture pending.  ID on board, now on Ancef, recommend TEE eventually, however cardiology do not feel patient TEE candidate at this moment due to respiratory distress.  EP on board, not candidate for ICD extraction.  Patient has fluid overload, cardiology restarted GDMT including Coreg, Entresto,  Lasix, spironolactone.  Continue tube feeding.  PT and OT recommend SNF.  Appreciate cardiology and ID assistance.  For detailed assessment and plan, please refer to above/below as I have made changes wherever appropriate.   Marvel Plan, MD PhD Stroke Neurology 10/12/2022 6:21 PM  This patient is critically ill due to bilateral stroke, status post TNK, significant send hemorrhagic transformation, CHF status post ICD, large heart pseudoaneurysm, fever, bacteremia and at significant risk of neurological worsening, death form recurrent stroke, hemorrhagic transformation with cerebral edema, brain herniation, heart failure, sepsis, aspiration. This patient's care requires constant monitoring of vital signs, hemodynamics, respiratory and cardiac monitoring, review of multiple databases, neurological assessment,  discussion with family, other specialists and medical decision making of high complexity. I spent 40 minutes of neurocritical care time in the care of this patient.

## 2022-10-12 NOTE — Progress Notes (Signed)
Rounding Note    Patient Name: [name] Date of Encounter: 10/12/2022  [facility] Cardiologist: Peter [place], MD   Subjective   No neuro change. No overt respiratory difficulty. Remains febrile. BP mostly high. Fluid balance +ve 900 ml last 24 h, weight 6 lb above admission weight and higher than estimated "dry weight" 231-235 based on OP visits earlier this year. CT head w stable ICH findings. Started on IV heparin.  Inpatient Medications    Scheduled Meds:  atorvastatin  80 mg Per Tube Daily   carvedilol  12.5 mg Per Tube BID WC   Chlorhexidine Gluconate Cloth  6 each Topical Daily   feeding supplement (PROSource TF20)  60 mL Per Tube Daily   furosemide  40 mg Oral Daily   gabapentin  300 mg Per Tube QHS   insulin aspart  0-9 Units Subcutaneous Q4H   multivitamin with minerals  1 tablet Per Tube Daily   nicotine  7 mg Transdermal Daily   pantoprazole (PROTONIX) IV  40 mg Intravenous QHS   polyethylene glycol  17 g Per Tube BID   sacubitril-valsartan  1 tablet Oral BID   senna-docusate  1 tablet Per Tube BID   spironolactone  12.5 mg Oral Daily   Continuous Infusions:   ceFAZolin (ANCEF) IV Stopped (10/12/22 0546)   feeding supplement (OSMOLITE 1.5 CAL) 65 mL/hr at 10/12/22 1000   heparin 1,200 Units/hr (10/12/22 1017)   PRN Meds: acetaminophen **OR** acetaminophen (TYLENOL) oral liquid 160 mg/5 mL **OR** acetaminophen, metoprolol tartrate, ondansetron (ZOFRAN) IV, mouth rinse, senna-docusate   Vital Signs    Vitals:   10/12/22 0759 10/12/22 0800 10/12/22 0900 10/12/22 1000  BP:  (!) 139/91 (!) 147/93 121/84  Pulse:  76 77 66  Resp:  (!) 26 17 (!) 22  Temp: (!) 101.2 F (38.4 C)     TempSrc: Axillary     SpO2:  98% 94% (!) 89%  Weight:      Height:        Intake/Output Summary (Last 24 hours) at 10/12/2022 1110 Last data filed at 10/12/2022 1000 Gross per 24 hour  Intake 2046.67 ml  Output 1200 ml  Net 846.67 ml      10/10/2022     5:00 AM 10/09/2022    5:00 AM 10/08/2022    5:00 AM  Last 3 Weights  Weight (lbs) 237 lb 7 oz 238 lb 12.1 oz 242 lb 4.6 oz  Weight (kg) 107.7 kg 108.3 kg 109.9 kg      Telemetry    SR w PVCs - Personally Reviewed  ECG    No new tracing - Personally Reviewed  Physical Exam  Obese GEN: No acute distress.   Neck: No JVD Cardiac: RRR, no murmurs, rubs, or gallops.  Respiratory: Clear to auscultation bilaterally. GI: Soft, nontender, non-distended  MS: No edema; No deformity. Neuro:  R hemiplegia Psych: Normal affect   Labs    High Sensitivity Troponin:   Recent Labs  Lab 10/06/22 1032 10/06/22 1257 10/06/22 1453 10/06/22 1748  TROPONINIHS 45* 43* 39* 35*     Chemistry Recent Labs  Lab 10/06/22 0231 10/07/22 0817 10/09/22 1018 10/09/22 1751 10/10/22 0455 10/11/22 0353 10/12/22 0501  NA 148*   < > 139  --  135 133* 132*  K 3.9   < > 4.2  --  3.7 3.5 3.9  CL 117*   < > 106  --  101 98 99  CO2 22   < >  20*  --  23 24 22   GLUCOSE 145*   < > 114*  --  161* 138* 117*  BUN 13   < > 18  --  21 24* 20  CREATININE 0.96   < > 0.98  --  0.97 0.96 0.93  CALCIUM 8.9   < > 8.9  --  8.5* 8.1* 8.5*  MG 2.1  --  2.2  --   --   --   --   PROT  --   --   --  6.5  --   --   --   ALBUMIN  --   --   --  3.1*  --   --   --   AST  --   --   --  36  --   --   --   ALT  --   --   --  24  --   --   --   ALKPHOS  --   --   --  82  --   --   --   BILITOT  --   --   --  1.9*  --   --   --   GFRNONAA >60   < > >60  --  >60 >60 >60  ANIONGAP 9   < > 13  --  11 11 11    < > = values in this interval not displayed.    Lipids No results for input(s): "CHOL", "TRIG", "HDL", "LABVLDL", "LDLCALC", "CHOLHDL" in the last 168 hours.  Hematology Recent Labs  Lab 10/10/22 0455 10/11/22 0353 10/12/22 0501  WBC 10.2 9.8 10.2  RBC 4.55 4.72 5.08  HGB 13.3 13.7 15.3  HCT 38.5* 40.8 43.6  MCV 84.6 86.4 85.8  MCH 29.2 29.0 30.1  MCHC 34.5 33.6 35.1  RDW 12.7 12.6 12.5  PLT 172 190 178    Thyroid No results for input(s): "TSH", "FREET4" in the last 168 hours.  BNPNo results for input(s): "BNP", "PROBNP" in the last 168 hours.  DDimer No results for input(s): "DDIMER" in the last 168 hours.   Radiology    CT HEAD WO CONTRAST ( )  Result Date: 10/12/2022 CLINICAL DATA:  62 year old male code stroke presentation on 10/02/2022, left MCA M2 occlusion. Endovascular reperfusion. Bilateral parenchymal hematoma and subarachnoid hemorrhage. Subsequent encounter. EXAM: CT HEAD WITHOUT CONTRAST TECHNIQUE: Contiguous axial images were obtained from the base of the skull through the vertex without intravenous contrast. RADIATION DOSE REDUCTION: This exam was performed according to the departmental dose-optimization program which includes automated exposure control, adjustment of the mA and/or kV according to patient size and/or use of iterative reconstruction technique. COMPARISON:  Head CTs 10/09/2022 and earlier. FINDINGS: Brain: Multifocal ill-defined hyperdense hemorrhage now in the posterior left hemisphere affecting the posterior left frontal lobe, left parietal lobe, and portions of the left occipital lobe. Lateral dorsal thalamic involvement redemonstrated. When comparing coronal series, this hemorrhage has not significantly changed in size or configuration since 10/09/2022. Similar contralateral indistinct right occipital lobe hemorrhage (series 4, image 14) likewise is stable in size and configuration. Although some of these blood products have faded slightly since 10/09/2022 (coronal image 52). Similar indistinct and somewhat petechial bilateral cerebellar hemorrhage has been present since 10/06/2022 and not significantly changed since that time (series 4, image 9). Widespread bilateral hemisphere hypodensity appears to be a combination of edema and encephalomalacia. Superimposed subarachnoid blood has regressed since 10/03/2022. And intraventricular blood has largely resolved since that  time.  Basilar cisterns remain patent. No ventriculomegaly. Supratentorial mass effect with mild rightward midline shift of up to 3 mm is stable since 10/09/2022. Vascular: No suspicious intracranial vascular hyperdensity. Skull: Stable, intact. Sinuses/Orbits: Visualized paranasal sinuses and mastoids are stable and well aerated. Other: Left nasoenteric tube in place.  Stable orbit and scalp. IMPRESSION: 1. Extensive bilateral cerebral and cerebellar hemisphere petechial and infiltrative intra-axial hemorrhage is stable since 10/09/2022. The largest confluent hematoma is in the left posterior MCA territory. SAH and IVH have regressed since 10/03/2022. 2. Combination of brain edema and encephalomalacia superimposed on the above does not appear significantly changed. 3. Stable subsequent intracranial mass effect since 10/09/2022 with up to 3 mm of rightward midline shift, patent basilar cisterns. 4. No new intracranial abnormality identified. Electronically Signed   By: Odessa Fleming M.D.   On: 10/12/2022 04:43   DG CHEST PORT 1 VIEW  Result Date: 10/10/2022 CLINICAL DATA:  Recent stroke.  Cough. EXAM: PORTABLE CHEST 1 VIEW COMPARISON:  10/09/2022 FINDINGS: AICD remains in place. Soft feeding tube enters the abdomen. Similar appearance of atelectasis at both lung bases. This could be minimally worsened. No dramatic change. The upper lobes remain clear. IMPRESSION: Similar appearance of atelectasis at both lung bases. This could be minimally worsened. Electronically Signed   By: Paulina Fusi M.D.   On: 10/10/2022 15:40    Cardiac Studies    Echocardiogram 10/07/2022   1. There is a large inferior pseudoaneurysm with a depth of 3 cm and a maximum width of roughly 5 cm. The "neck" at the level of the inferior wall measures 2.3 cm. The wall of the pseudoaneurysm is made up of a layer of thrombus that is roughly 1.5 cm thick. No mobile thrombus is seen.. Left ventricular ejection fraction, by estimation, is 30 to 35%.  The left ventricle has moderately decreased function. The left ventricle demonstrates regional wall motion abnormalities (see scoring diagram/findings for description). The left ventricular internal cavity size was mildly dilated. There is akinesis of the left ventricular, basal-mid inferior wall and inferolateral wall. There is severe hypokinesis of the left ventricular, basal-mid inferoseptal wall. There is severe hypokinesis of the left ventricular, basal-mid lateral wall. There is peudoaneurysm of the left ventricular, basal inferior wall. Conclusion(s)/Recommendation(s): Findings are highly concerning for redevelopment of pseudoaneurysm of the inferior wall that was repaired in 2014. There appears to be dehiscence of the patch used to correct abnormality, with development of new pseudoaneurysm. No protruding or mobile thrombi are seen in the pseudoaneurysm, but a thick layer of thrombus makes up the external wall of the pseudoaneurysm. Recommend CT angiography or MR angiography of the heart for precise definition.     CARDIAC CTA 10/10/2022   IMPRESSION: 1. 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 Large burden of thrombus on top of patch repair laminated but measuring as thick as 1.6 cm   3.  No pericardial effusion   4. Severe bi ventricular failure with D shaped septum indicating elevated PA pressures   5.  Occluded SVG to OM   6.  Pacing wires noted in RA/RV   7. Normal ascending thoracic aorta 3.4 cm with no evidence of dissection   8.  Chicken wing appendage with no thrombus present  Patient Profile     62 y.o. male 62 y.o. male   with longstanding of history of CAD,s/p CABG and inferior wall pseudoaneurysm patch 2014, presents with acute embolic stroke and severe right hemiparesis/dysarthria complicated by hemorrhagic  transformation of acute stroke as well as areas of multiple old strokes, all of which have embolic appearance.  Echocardiogram  is concerning for dehiscence of the pseudoaneurysm patch, with broad communication between the left ventricular cavity and a thrombus filled pseudoaneurysm. CT (poor quality) suggests intact patch with thick layer of thrombus. Developed fever and S epi bacteremia on 10/09/2022.  Assessment & Plan    LV pseudoaneurysm: Starting heparin today, transition to PO anticoagulant when OK w Stroke team. Whether or not the pseudoaneurysm patch is intact or not, there is a large burden of thrombus in contact with the LV blood pool. Will need to be anticoagulated long term. He is not a candidate for a difficult redo cardiac surgery at this time and may never be healthy enough for cardiac surgery.   HFrEF: improved after IV diuretic, but +ve fluid balance when diuretic stopped. Will place on daily diuretic via enteral route,resume spiro, resume Entresto at 50% of pre-admission dose. Moderately depressed left ventricular systolic function. Has an Abbott ICD in place.  Might be able to use this to help judge fluid status via thoracic impedance (he was enrolled in the heart failure device clinic as an outpatient).  Receiving home dose of carvedilol. Was also on Farxiga, Entresto, spironolactone chronically, all of these have been on hold after stroke and we are gradually reintroducing them. CAD: No evidence for recent acute coronary syndrome.  On high-dose statin. Embolic stroke/ICH: Presented with a right brain stroke aborted with TNK, but then had a left MCA and right cerebellar acute infarct treated with thrombectomy, but complicated by left frontoparietal intracranial hemorrhage and subarachnoid hemorrhage.  Has right hemiparesis, inability to speak, unable to swallow.  Receiving speech therapy.  He also happens to have a moderate-sized PFO by transcranial Dopplers, but LV / pseudoaneurysm thrombus is the most likely cause of thromboembolic events. Fever/Staph epi bacteremia: unfortunately raises concern for  endocarditis or ICD lead contamination. May need TEE, but I am worried about his airway. TEE would also give [place] another look at the LV thrombus/pseudoaneurysm patch.     For questions or updates, please contact [facility] Please consult [email or web address] for contact info under        Signed, Thurmon Fair, MD  10/12/2022, 11:10 AM

## 2022-10-13 DIAGNOSIS — I6310 Cerebral infarction due to embolism of unspecified precerebral artery: Secondary | ICD-10-CM

## 2022-10-13 DIAGNOSIS — I729 Aneurysm of unspecified site: Secondary | ICD-10-CM | POA: Diagnosis not present

## 2022-10-13 DIAGNOSIS — I513 Intracardiac thrombosis, not elsewhere classified: Secondary | ICD-10-CM | POA: Diagnosis not present

## 2022-10-13 DIAGNOSIS — I639 Cerebral infarction, unspecified: Secondary | ICD-10-CM | POA: Diagnosis not present

## 2022-10-13 DIAGNOSIS — Q2112 Patent foramen ovale: Secondary | ICD-10-CM

## 2022-10-13 DIAGNOSIS — I619 Nontraumatic intracerebral hemorrhage, unspecified: Secondary | ICD-10-CM | POA: Diagnosis not present

## 2022-10-13 LAB — BASIC METABOLIC PANEL
Anion gap: 11 (ref 5–15)
BUN: 20 mg/dL (ref 8–23)
CO2: 21 mmol/L — ABNORMAL LOW (ref 22–32)
Calcium: 8.3 mg/dL — ABNORMAL LOW (ref 8.9–10.3)
Chloride: 99 mmol/L (ref 98–111)
Creatinine, Ser: 0.85 mg/dL (ref 0.61–1.24)
GFR, Estimated: >60 mL/min (ref >60)
Glucose, Bld: 126 mg/dL — ABNORMAL HIGH (ref 70–99)
Potassium: 3.9 mmol/L (ref 3.5–5.1)
Sodium: 131 mmol/L — ABNORMAL LOW (ref 135–145)

## 2022-10-13 LAB — GLUCOSE, CAPILLARY
Glucose-Capillary: 154 mg/dL — ABNORMAL HIGH (ref 70–99)
Glucose-Capillary: 198 mg/dL — ABNORMAL HIGH (ref 70–99)
Glucose-Capillary: 186 mg/dL — ABNORMAL HIGH (ref 70–99)
Glucose-Capillary: 209 mg/dL — ABNORMAL HIGH (ref 70–99)
Glucose-Capillary: 193 mg/dL — ABNORMAL HIGH (ref 70–99)

## 2022-10-13 LAB — CULTURE, BLOOD (ROUTINE X 2)

## 2022-10-13 LAB — CBC
HCT: 46.2 % (ref 39.0–52.0)
Hemoglobin: 15.7 g/dL (ref 13.0–17.0)
MCH: 29.9 pg (ref 26.0–34.0)
MCHC: 34 g/dL (ref 30.0–36.0)
MCV: 88 fL (ref 80.0–100.0)
Platelets: 183 10*3/uL (ref 150–400)
RBC: 5.25 MIL/uL (ref 4.22–5.81)
RDW: 12.6 % (ref 11.5–15.5)
WBC: 12.4 10*3/uL — ABNORMAL HIGH (ref 4.0–10.5)
nRBC: 0 % (ref 0.0–0.2)

## 2022-10-13 LAB — HEPARIN LEVEL (UNFRACTIONATED)
Heparin Unfractionated: 0.66 IU/mL (ref 0.30–0.70)
Heparin Unfractionated: 0.38 IU/mL (ref 0.30–0.70)
Heparin Unfractionated: 0.21 IU/mL — ABNORMAL LOW (ref 0.30–0.70)

## 2022-10-13 NOTE — Progress Notes (Signed)
Rounding Note    Patient Name: [name] Date of Encounter: 10/13/2022  [facility] Cardiologist: Peter Swaz[place], MD   Subjective   No acute events overnight, stable ICH per CT head done yesterday.  Patient dozing off to sleep during my interview.  Unable to answer any of my questions.  EP evaluated and deemed not a candidate for ICD lead extraction.   Inpatient Medications    Scheduled Meds:  atorvastatin  80 mg Per Tube Daily   carvedilol  12.5 mg Per Tube BID WC   Chlorhexidine Gluconate Cloth  6 each Topical Daily   feeding supplement (PROSource TF20)  60 mL Per Tube Daily   furosemide  40 mg Per Tube Daily   gabapentin  300 mg Per Tube QHS   insulin aspart  0-9 Units Subcutaneous Q4H   multivitamin with minerals  1 tablet Per Tube Daily   nicotine  7 mg Transdermal Daily   pantoprazole (PROTONIX) IV  40 mg Intravenous QHS   polyethylene glycol  17 g Per Tube BID   sacubitril-valsartan  1 tablet Per Tube BID   senna-docusate  1 tablet Per Tube BID   spironolactone  12.5 mg Per Tube Daily   Continuous Infusions:   ceFAZolin (ANCEF) IV Stopped (10/13/22 0619)   feeding supplement (OSMOLITE 1.5 CAL) 65 mL/hr at 10/13/22 0800   heparin 1,100 Units/hr (10/13/22 0905)   PRN Meds: acetaminophen **OR** acetaminophen (TYLENOL) oral liquid 160 mg/5 mL **OR** acetaminophen, metoprolol tartrate, ondansetron (ZOFRAN) IV, mouth rinse, senna-docusate   Vital Signs    Vitals:   10/13/22 0600 10/13/22 0700 10/13/22 0800 10/13/22 0900  BP: (!) 140/98 (!) 130/97 122/82 118/80  Pulse: 74 77 78 89  Resp: (!) 21 (!) 30 (!) 25 18  Temp:   (!) 101 F (38.3 C)   TempSrc:   Axillary   SpO2: 95% 99% 97% 97%  Weight:      Height:        Intake/Output Summary (Last 24 hours) at 10/13/2022 1057 Last data filed at 10/13/2022 0800 Gross per 24 hour  Intake 1945.24 ml  Output 2450 ml  Net -504.76 ml      10/12/2022    7:00 AM 10/10/2022    5:00 AM 10/09/2022    5:00  AM  Last 3 Weights  Weight (lbs) 237 lb 7 oz 237 lb 7 oz 238 lb 12.1 oz  Weight (kg) 107.7 kg 107.7 kg 108.3 kg      Telemetry    SR w PVCs - Personally Reviewed  ECG    No new tracing - Personally Reviewed  Physical Exam  Obese GEN: No acute distress.   Neck: No JVD Cardiac: RRR, no murmurs, rubs, or gallops.  Respiratory: Clear to auscultation bilaterally. GI: Soft, nontender, non-distended  MS: No edema; No deformity. Neuro:  R hemiplegia Psych: Normal affect   Labs    High Sensitivity Troponin:   Recent Labs  Lab 10/06/22 1032 10/06/22 1257 10/06/22 1453 10/06/22 1748  TROPONINIHS 45* 43* 39* 35*     Chemistry Recent Labs  Lab 10/09/22 1018 10/09/22 1751 10/10/22 0455 10/11/22 0353 10/12/22 0501 10/13/22 0220  NA 139  --    < > 133* 132* 131*  K 4.2  --    < > 3.5 3.9 3.9  CL 106  --    < > 98 99 99  CO2 20*  --    < > 24 22 21*  GLUCOSE 114*  --    < >  138* 117* 126*  BUN 18  --    < > 24* 20 20  CREATININE 0.98  --    < > 0.96 0.93 0.85  CALCIUM 8.9  --    < > 8.1* 8.5* 8.3*  MG 2.2  --   --   --   --   --   PROT  --  6.5  --   --   --   --   ALBUMIN  --  3.1*  --   --   --   --   AST  --  36  --   --   --   --   ALT  --  24  --   --   --   --   ALKPHOS  --  82  --   --   --   --   BILITOT  --  1.9*  --   --   --   --   GFRNONAA >60  --    < > >60 >60 >60  ANIONGAP 13  --    < > 11 11 11    < > = values in this interval not displayed.    Lipids No results for input(s): "CHOL", "TRIG", "HDL", "LABVLDL", "LDLCALC", "CHOLHDL" in the last 168 hours.  Hematology Recent Labs  Lab 10/11/22 0353 10/12/22 0501 10/13/22 0220  WBC 9.8 10.2 12.4*  RBC 4.72 5.08 5.25  HGB 13.7 15.3 15.7  HCT 40.8 43.6 46.2  MCV 86.4 85.8 88.0  MCH 29.0 30.1 29.9  MCHC 33.6 35.1 34.0  RDW 12.6 12.5 12.6  PLT 190 178 183   Thyroid No results for input(s): "TSH", "FREET4" in the last 168 hours.  BNPNo results for input(s): "BNP", "PROBNP" in the last 168 hours.   DDimer No results for input(s): "DDIMER" in the last 168 hours.   Radiology    CT HEAD WO CONTRAST ( )  Result Date: 10/12/2022 CLINICAL DATA:  62 year old male code stroke presentation on 10/02/2022, left MCA M2 occlusion. Endovascular reperfusion. Bilateral parenchymal hematoma and subarachnoid hemorrhage. Subsequent encounter. EXAM: CT HEAD WITHOUT CONTRAST TECHNIQUE: Contiguous axial images were obtained from the base of the skull through the vertex without intravenous contrast. RADIATION DOSE REDUCTION: This exam was performed according to the departmental dose-optimization program which includes automated exposure control, adjustment of the mA and/or kV according to patient size and/or use of iterative reconstruction technique. COMPARISON:  Head CTs 10/09/2022 and earlier. FINDINGS: Brain: Multifocal ill-defined hyperdense hemorrhage now in the posterior left hemisphere affecting the posterior left frontal lobe, left parietal lobe, and portions of the left occipital lobe. Lateral dorsal thalamic involvement redemonstrated. When comparing coronal series, this hemorrhage has not significantly changed in size or configuration since 10/09/2022. Similar contralateral indistinct right occipital lobe hemorrhage (series 4, image 14) likewise is stable in size and configuration. Although some of these blood products have faded slightly since 10/09/2022 (coronal image 52). Similar indistinct and somewhat petechial bilateral cerebellar hemorrhage has been present since 10/06/2022 and not significantly changed since that time (series 4, image 9). Widespread bilateral hemisphere hypodensity appears to be a combination of edema and encephalomalacia. Superimposed subarachnoid blood has regressed since 10/03/2022. And intraventricular blood has largely resolved since that time. Basilar cisterns remain patent. No ventriculomegaly. Supratentorial mass effect with mild rightward midline shift of up to 3 mm is stable  since 10/09/2022. Vascular: No suspicious intracranial vascular hyperdensity. Skull: Stable, intact. Sinuses/Orbits: Visualized paranasal sinuses and mastoids are stable and well  aerated. Other: Left nasoenteric tube in place.  Stable orbit and scalp. IMPRESSION: 1. Extensive bilateral cerebral and cerebellar hemisphere petechial and infiltrative intra-axial hemorrhage is stable since 10/09/2022. The largest confluent hematoma is in the left posterior MCA territory. SAH and IVH have regressed since 10/03/2022. 2. Combination of brain edema and encephalomalacia superimposed on the above does not appear significantly changed. 3. Stable subsequent intracranial mass effect since 10/09/2022 with up to 3 mm of rightward midline shift, patent basilar cisterns. 4. No new intracranial abnormality identified. Electronically Signed   By: Odessa Fleming M.D.   On: 10/12/2022 04:43    Cardiac Studies    Echocardiogram 10/07/2022   1. There is a large inferior pseudoaneurysm with a depth of 3 cm and a maximum width of roughly 5 cm. The "neck" at the level of the inferior wall measures 2.3 cm. The wall of the pseudoaneurysm is made up of a layer of thrombus that is roughly 1.5 cm thick. No mobile thrombus is seen.. Left ventricular ejection fraction, by estimation, is 30 to 35%. The left ventricle has moderately decreased function. The left ventricle demonstrates regional wall motion abnormalities (see scoring diagram/findings for description). The left ventricular internal cavity size was mildly dilated. There is akinesis of the left ventricular, basal-mid inferior wall and inferolateral wall. There is severe hypokinesis of the left ventricular, basal-mid inferoseptal wall. There is severe hypokinesis of the left ventricular, basal-mid lateral wall. There is peudoaneurysm of the left ventricular, basal inferior wall. Conclusion(s)/Recommendation(s): Findings are highly concerning for redevelopment of pseudoaneurysm of the  inferior wall that was repaired in 2014. There appears to be dehiscence of the patch used to correct abnormality, with development of new pseudoaneurysm. No protruding or mobile thrombi are seen in the pseudoaneurysm, but a thick layer of thrombus makes up the external wall of the pseudoaneurysm. Recommend CT angiography or MR angiography of the heart for precise definition.     CARDIAC CTA 10/10/2022   IMPRESSION: 1. 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 Large burden of thrombus on top of patch repair laminated but measuring as thick as 1.6 cm   3.  No pericardial effusion   4. Severe bi ventricular failure with D shaped septum indicating elevated PA pressures   5.  Occluded SVG to OM   6.  Pacing wires noted in RA/RV   7. Normal ascending thoracic aorta 3.4 cm with no evidence of dissection   8.  Chicken wing appendage with no thrombus present  Patient Profile     62 y.o. male 62 y.o. male   with longstanding of history of CAD,s/p CABG and inferior wall pseudoaneurysm patch 2014, presents with acute embolic stroke and severe right hemiparesis/dysarthria complicated by hemorrhagic transformation of acute stroke as well as areas of multiple old strokes, all of which have embolic appearance.  Echocardiogram is concerning for dehiscence of the pseudoaneurysm patch, with broad communication between the left ventricular cavity and a thrombus filled pseudoaneurysm. CT (poor quality) suggests intact patch with thick layer of thrombus. Developed fever and S epi bacteremia on 10/09/2022.  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 anticoagulant when cleared by neurology. He is not a candidate for a difficult redo cardiac surgery at this time and may never be healthy enough for cardiac surgery.    HFrEF LVEF 30 to 35% s/p Saint Jude ICD -Continue p.o. Lasix  40 mg once  daily -Continue carvedilol 12.5 mg twice daily -Continue Entresto 49-51 mg twice daily -Continue spironolactone 12.5 mg once daily -Unable to resume [place] due to active bacteremia  CAD s/p CABG -No evidence for recent acute coronary syndrome.  On high-dose statin.  Embolic stroke/ICH:  -Presented with a right brain stroke aborted with TNK, but then had a left MCA and right cerebellar acute infarct treated with thrombectomy, but complicated by left frontoparietal intracranial hemorrhage and subarachnoid hemorrhage.  Has right hemiparesis, inability to speak, unable to swallow.  Receiving speech therapy.  He also happens to have a moderate-sized PFO by transcranial Dopplers, but LV / pseudoaneurysm thrombus is the most likely cause of thromboembolic events.  Staph epi bacteremia -Saint Jude ICD in place, concern for device contamination. -EP evaluated and deemed not a candidate for lead extraction. -Management per primary team, with IV antibiotics     For questions or updates, please contact [facility] Please consult [email or web address] for contact info under        Signed, Nazanin Kinner P Dilcia Rybarczyk, MD  10/13/2022, 10:57 AM

## 2022-10-13 NOTE — Progress Notes (Addendum)
STROKE TEAM PROGRESS NOTE   INTERVAL HISTORY Patient continues to have low-grade fevers, but his blood pressure remains stable.  May need TEE per ID. Still having Cheyne Stokes respirations.  Tmax 101 for 24 hours Neurological exam is unchanged. Vitals:   10/13/22 0500 10/13/22 0600 10/13/22 0700 10/13/22 0800  BP: (!) 148/79 (!) 140/98 (!) 130/97 122/82  Pulse: 78 74 77 78  Resp: (!) 23 (!) 21 (!) 30 (!) 25  Temp:    (!) 101 F (38.3 C)  TempSrc:    Axillary  SpO2: 95% 95% 99% 97%  Weight:      Height:       CBC:  Recent Labs  Lab 10/12/22 0501 10/13/22 0220  WBC 10.2 12.4*  HGB 15.3 15.7  HCT 43.6 46.2  MCV 85.8 88.0  PLT 178 183    Basic Metabolic Panel:  Recent Labs  Lab 10/09/22 1018 10/10/22 0455 10/12/22 0501 10/13/22 0220  NA 139   < > 132* 131*  K 4.2   < > 3.9 3.9  CL 106   < > 99 99  CO2 20*   < > 22 21*  GLUCOSE 114*   < > 117* 126*  BUN 18   < > 20 20  CREATININE 0.98   < > 0.93 0.85  CALCIUM 8.9   < > 8.5* 8.3*  MG 2.2  --   --   --    < > = values in this interval not displayed.     IMAGING past 24 hours No results found.   Physical Exam  Constitutional: Appears well-developed and well-nourished.  Cardiovascular: Normal rate and regular rhythm.  Respiratory:  Cheyne-Stokes respiratory pattern GI: Soft.  No distension. There is no tenderness.  Skin: WDI  Neuro: Mental Status: Patient is awake, alert, speech is dysarthric with expressive aphasia. .Follows simple midline commands, intermittently following commands on the left side Cranial Nerves: II: Pupils are equal, round, and reactive to light.   III,IV, VI: EOMI without ptosis or diplopia.  VII: Mild right facial droop  VIII: Hearing is intact to voice XII: tongue is midline without atrophy or fasciculations.  Motor: Tone is normal. Bulk is normal.  Moves left upper and lower extremities spontaneously, withdraws right lower extremity to noxious, slight movement of right upper  extremity     ASSESSMENT/PLAN [name] is a 62 y.o. male with history of CAD s/p PCI w/ stent to LCS and LAD, s/p 1v CABG, multiple CVAs (R cerebellar 04/2022),  syscolic HF s/p ICD, HLD, HTN presenting with slurred speech and L sided weakness now s/p TNK and IR thrombectomy of L MCA (M3 segment) c/b  hemorrhage.   TIA - Right brain stroke aborted s/p TNK and later that day second stroke with left M2 occlusion Stroke - Left MCA and right cerebellar infarcts left M3 occlusion s/p IR with TICIs, complicated by L frontoparietal ICH and SAH, etiology likely cardioembolic source  CT 6/18 no acute finding, but the bilateral frontal, parietal, occipital and cerebellum infarcts CTA head and neck unremarkable CTA head and neck repeat new short segment occlusion of distal left M2 S/p IR with left M3 TICIs reperfusion  post IR CT Hyperdensity within the left sylvian fissure may represent small subarachnoid hemorrhage versus contrast. CT Head: Progressed hemorrhage in L frontoparietal region w/ 3cm hematoma and regional SAH MRI  progressive L fronto parietal hematoma (~7cm), regional SAH, increased mass effect.  Likely underlying infarct. Sm R cerebellar infarct, acute.  Numerous chornic cerebral and cerebellar infarcts Repeat CT 6/19 increase in size of the left frontal/parietal hematoma. New extension into the left lateral ventricle. CT repeat 6/22 x 2 - New component of intraparenchymal hemorrhage in the posterior right hemisphere that measures approximately 2.5 x 1.4 cm. (On my review, more like right brain redistribution instead of new hemorrhage)  Repeat CT 6/23: Unchanged appearance of bilateral mixed intraparenchymal and subarachnoid hemorrhage. Trace rightward midline shift is unchanged. No hydrocephalus CT repeat 6/25 stable hemorrhage, unchanged CT Head 6/28-  stable hematoma. SAH and IVH have regressed since 10/03/2022.  Echo: Large inferior pseudoaneurysm with a depth of 3 cm and a  maximum width of roughly 5 cm the wall of the pseudoaneurysm is made above the layer of thrombus that is roughly 1.5 cm thick. EF 30 to 35%, severe hypokinesis of left ventricle. No afib with ICD interrogation LE venous doppler neg for DVT TCD bubble study showed moderate sized PFO LDL 63 HgbA1c 5.8 UDS neg VTE prophylaxis - SQH  aspirin 81 mg daily and clopidogrel 75 mg daily prior to admission, now on IV Heparin per stroke protocol Therapy recommendations:  SNF Disposition:  pending  CHF s/p ICD Systolic HF, s/p ICD 2/2 ICM.  Echo EF 30-35%  Trop 45 -> 43 -> 39 -> 35 CK 3714, CKMB 3.5 CXR 6/25 central vascular congestion and developing interstitial edema.  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  Cardiology on board, appreciate assistance  CTA of the heart intact chronic patch repair to inferior and inferior septal pseudoaneurysm, large burden of thrombus on top of patch repair, severe biventricular failure with D-shaped septum, occluded SVG to OM Heparin IV started 6/28 Ultimately patient will need to repeat undergo repeat pseudoaneurysm repair if he becomes healthy enough to tolerate surgery  Hyperlipidemia Home meds:  atorvastatin 80 LDL 63, goal < 70 Lipitor 80 resumed    Continue statin at discharge  Tobacco abuse Current smoker Smoking cessation counseling will be  provided Nicotine patch provided  Fever ? Aspiration pneumonia Tmax 101.6 -> 99.8-> afebrile->100.8 -> 102-> 101.7-> 101.1 WBC 7.6->9.4->10.2->9.8->10.2 UA negative CXR central vascular congestion and developing interstitial edema.  CXR 6/26 similar appearance of atelectasis at both lung bases Procalc negative.  Blood culture 2/4 bottles staph epidermidis, ID consulted, appreciate recommendations Blood culture repeat pending -no growth x 2 days NT suction PRN Ceftriaxone -> ancef ID consulted - pending repeat culture and recommend TEE eventually EP on board - not candidate for ICD extraction Cardiology felt pt not candidate for TEE now given respiratory distress  Dysphagia  Vomiting on zofran PRN, improved Pt ate well 6/24, nocturnal TF d/c'ed -> fever with possible aspiration -> NPO and TF restarted 6/27 having difficulty coordinating swallowing and respirations  continue NPO for now  PFO Known PFO in the past TCD bubble study showed a moderate-sized PFO LE venous Doppler negative for DVT  Not an emergent issue at  this time  Other stroke risk factors Obesity with BMI 32.23 CAD  Other Active Problems Thrombocytopenia, platelet 110->89-> 172-> 190-> stable CK 436 -> 3714 -> 610-> 490  Hospital day # 11  Patient seen and examined by NP/APP with MD. MD to update note as needed.   Elmer Picker, DNP, FNP-BC [facility] Pager: [phone]  I have personally obtained history,examined this patient, reviewed notes, independently viewed imaging studies, participated in medical decision making and plan of care.ROS completed by me personally and pertinent positives fully documented  I have made any additions or clarifications directly to the above note. Agree with note above.  Patient continues to have fevers.  Continue antibiotics.  ID consulted.  Patient not a candidate for TEE at the present time due to respiratory distress.  Also not a candidate for ICD explant  as per EP team at the present time  .Marland Kitchen  Continue ongoing therapies..  No family at the bedside.This patient is critically ill and at significant risk of neurological worsening, death and care requires constant monitoring of vital signs, hemodynamics,respiratory and cardiac monitoring, extensive review of multiple databases, frequent neurological assessment, discussion with family, other specialists and medical decision making of high complexity.I have made any additions or clarifications directly to the above note.This critical care time does not reflect procedure time, or teaching time or supervisory time of PA/NP/Med Resident etc but could involve care discussion time.  I spent 30 minutes of neurocritical care time  in the care of  this patient.      Delia Heady, MD Medical Director [facility] Pager: [phone] 10/13/2022 1:49 PM

## 2022-10-13 NOTE — Progress Notes (Signed)
ANTICOAGULATION CONSULT NOTE - Follow Up Consult  Pharmacy Consult for heparin Indication: Ventricular pseudoaneurysm  No Known Allergies  Patient Measurements: Height: 6\' 1"  (185.4 cm) Weight: 107.7 kg (237 lb 7 oz) IBW/kg (Calculated) : 79.9 Heparin Dosing Weight: 101kg  Vital Signs: Temp: 98.7 F (37.1 C) (06/29 1200) Temp Source: Axillary (06/29 1200) BP: 127/90 (06/29 1500) Pulse Rate: 83 (06/29 1500)  Labs: Recent Labs    10/11/22 0353 10/12/22 0501 10/12/22 1547 10/12/22 2239 10/13/22 0220 10/13/22 0640 10/13/22 1454  HGB 13.7 15.3  --   --  15.7  --   --   HCT 40.8 43.6  --   --  46.2  --   --   PLT 190 178  --   --  183  --   --   HEPARINUNFRC  --   --    < > 0.48  --  0.66 0.38  CREATININE 0.96 0.93  --   --  0.85  --   --    < > = values in this interval not displayed.     Estimated Creatinine Clearance: 117.5 mL/min (by C-G formula based on SCr of 0.85 mg/dL).   Medical History: Past Medical History:  Diagnosis Date   Abnormal EKG    hx of ischemia showing up on ekg's   Acute myopericarditis    a. 03/2013: readm with hypoxia, tachycardia, elevated ESR/CRP, elevated troponin with Dressler syndrome and myopericarditis; treated with steroids.   Acute respiratory failure (HCC)    a. 01/2013: VDRF. b. 03/2013: hypoxia requiring supp O2 during adm, resolved by discharge.   C. difficile colitis    a. 01/2013 during prolonged adm. b. Recurred 03/2013.   Cardiac tamponade    a. 01/2013 s/p drain.   Cataracts, bilateral    Coronary artery disease    a. s/p MI in 2009 in [place] with stenting of the LCX and LAD;  b. 12/2012 NSTEMI/CAD: LM nl, LAD patent prox stent, LCX 50-70 isr (FFR 0.84), RCA dom, 167m, EF 40-45%-->Med Rx. c. 01/2013: anterolateral STEMI complicated by pericardial effusion (presumed purulent pericarditis) and tamponade s/p drain, ruptured LV pseudoaneurysm s/p CorMatrix patch, CABGx1 (SVG-OM1), fever, CVA, VDRF, C diff.   CVA (cerebral  infarction)    a. 01/2013 in setting of prolonged hospitalization - residual L arm weakness.   Dressler syndrome (HCC)    a. 03/2013: readm with hypoxia, tachycardia, elevated ESR/CRP, elevated troponin with Dressler syndrome and myopericarditis; treated with steroids.   Glaucoma    Hyperlipidemia    Hypokalemia    Hyponatremia    a. During late 2014.   Ischemic cardiomyopathy    a. Sept/Oct 2014: EF ~40% (ICM). b. 03/2013: EF 25-30%. Off ACEI due to hypotension (MIXED NICM/ICM).   Optic neuropathy, ischemic    Pericardial effusion    a. 01/2013: pericardial effusion (presumed purulent pericarditis) and cardiac tamponade s/p drain. b. Persistent moderate pericardial effusion 03/2013.   Pre-diabetes    Pseudoaneurysm of left ventricle of heart    a. 01/2013:  Ruptured inferoposterior LV pseudoaneurysm s/p CorMatrix patch.   Sinus bradycardia    asymptomatic   Stroke [facility]) 2009   weak lt side-lt arm   Tobacco abuse    Wears dentures    top   Assessment: 22 YOM with hx LV pseudoaneurysm s/p patch now with large thrombus burden, admitted with ischemic stroke with hemorrhagic conversion, CT stable today and pharmacy consulted to cautiously start heparin gtt for LV pseudoaneurysm, no bolus with low end  goals.  Has been on SQ heparin for DVT ppx with last dose 6/28 @0500 .  Heparin level 0.38 units/mL (therapeutic) on heparin 1100 units/hr. No signs of bleeding reported.  Goal of Therapy:  Heparin level 0.3-0.5 units/ml Monitor platelets by anticoagulation protocol: Yes   Plan:  Continue heparin at 1100 units/hr F/u 6 hour heparin level for trend Daily heparin level, CBC, s/s bleeding  Eldridge Scot, PharmD Clinical Pharmacist Please refer to [facility] for [facility] Pharmacy numbers 10/13/2022 4:00 PM

## 2022-10-13 NOTE — Progress Notes (Signed)
ANTICOAGULATION CONSULT NOTE - Follow Up Consult  Pharmacy Consult for heparin Indication: Ventricular pseudoaneurysm  No Known Allergies  Patient Measurements: Height: 6\' 1"  (185.4 cm) Weight: 107.7 kg (237 lb 7 oz) IBW/kg (Calculated) : 79.9 Heparin Dosing Weight: 101kg  Vital Signs: Temp: 101 F (38.3 C) (06/29 0800) Temp Source: Axillary (06/29 0800) BP: 140/98 (06/29 0600) Pulse Rate: 74 (06/29 0600)  Labs: Recent Labs    10/11/22 0353 10/12/22 0501 10/12/22 1547 10/12/22 2239 10/13/22 0220 10/13/22 0640  HGB 13.7 15.3  --   --  15.7  --   HCT 40.8 43.6  --   --  46.2  --   PLT 190 178  --   --  183  --   HEPARINUNFRC  --   --  <0.10* 0.48  --  0.66  CREATININE 0.96 0.93  --   --  0.85  --     Estimated Creatinine Clearance: 117.5 mL/min (by C-G formula based on SCr of 0.85 mg/dL).   Medical History: Past Medical History:  Diagnosis Date   Abnormal EKG    hx of ischemia showing up on ekg's   Acute myopericarditis    a. 03/2013: readm with hypoxia, tachycardia, elevated ESR/CRP, elevated troponin with Dressler syndrome and myopericarditis; treated with steroids.   Acute respiratory failure (HCC)    a. 01/2013: VDRF. b. 03/2013: hypoxia requiring supp O2 during adm, resolved by discharge.   C. difficile colitis    a. 01/2013 during prolonged adm. b. Recurred 03/2013.   Cardiac tamponade    a. 01/2013 s/p drain.   Cataracts, bilateral    Coronary artery disease    a. s/p MI in 2009 in [place] with stenting of the LCX and LAD;  b. 12/2012 NSTEMI/CAD: LM nl, LAD patent prox stent, LCX 50-70 isr (FFR 0.84), RCA dom, 128m, EF 40-45%-->Med Rx. c. 01/2013: anterolateral STEMI complicated by pericardial effusion (presumed purulent pericarditis) and tamponade s/p drain, ruptured LV pseudoaneurysm s/p CorMatrix patch, CABGx1 (SVG-OM1), fever, CVA, VDRF, C diff.   CVA (cerebral infarction)    a. 01/2013 in setting of prolonged hospitalization - residual L arm weakness.    Dressler syndrome (HCC)    a. 03/2013: readm with hypoxia, tachycardia, elevated ESR/CRP, elevated troponin with Dressler syndrome and myopericarditis; treated with steroids.   Glaucoma    Hyperlipidemia    Hypokalemia    Hyponatremia    a. During late 2014.   Ischemic cardiomyopathy    a. Sept/Oct 2014: EF ~40% (ICM). b. 03/2013: EF 25-30%. Off ACEI due to hypotension (MIXED NICM/ICM).   Optic neuropathy, ischemic    Pericardial effusion    a. 01/2013: pericardial effusion (presumed purulent pericarditis) and cardiac tamponade s/p drain. b. Persistent moderate pericardial effusion 03/2013.   Pre-diabetes    Pseudoaneurysm of left ventricle of heart    a. 01/2013:  Ruptured inferoposterior LV pseudoaneurysm s/p CorMatrix patch.   Sinus bradycardia    asymptomatic   Stroke [facility]) 2009   weak lt side-lt arm   Tobacco abuse    Wears dentures    top   Assessment: 27 YOM with hx LV pseudoaneurysm s/p patch now with large thrombus burden, admitted with ischemic stroke with hemorrhagic conversion, CT stable today and pharmacy consulted to cautiously start heparin gtt for LV pseudoaneurysm, no bolus with low end goals.  Has been on SQ heparin for DVT ppx with last dose 6/28 @0500 .  Heparin level SUPRA-therapeutic despite small decrease earlier this AM. No issues with infusion, spoke  with phleb level drawn away from heparin infusion.  CBC stable.   Goal of Therapy:  Heparin level 0.3-0.5 units/ml Monitor platelets by anticoagulation protocol: Yes   Plan:  Small decrease in heparin gtt to 1100 units/hr  F/u 6 hour heparin level Daily heparin level, CBC, s/s bleeding  Link Snuffer, PharmD, BCPS, BCCCP Clinical Pharmacist Please refer to [facility] Pharmacy numbers 10/13/2022 8:38 AM

## 2022-10-13 NOTE — Plan of Care (Signed)
Progressing toward goals. 

## 2022-10-13 NOTE — Progress Notes (Signed)
The wife came about 1700 and was very upset about the "condition" of her husband. Her complaints included:  1) The curtain to the room was wide open. I explained to her that he is a fall risk and I did this so staff could get an eye on him.  2) He had no sheet on- I explained he had a fever of 101 this AM and this afternoon he was still 99 even after Tylenol. She was also complaining that he didn't have any socks on and felt cold to touch. I declined to give her a blanket for him but said a sheet would be acceptable.  3) She c/o him not getting out of bed today. I put him in chair position twice today. I kept him in bed for percussion and vibration therapies as well.  4) She attempted to educate me on the fact that just because he cannot speak doesn't mean he doesn't have any feelings. I explained to her that I have treated her husband with nothing but respect for his needs and have provided him with care.  5) Upon questioning I told her I did not give him mouth sponges with ice water but I have been cleaning his mouth with suction toothbrush and mouth swab all day. He gets nutrition via the tube feed pump and cortrak. She accused me of not doing much of anything all shift. I deferred her to the nutrition note 2 days ago that said to keep NPO, the rhonchi in his lung and fevers suggesting aspiration and declined to give her ice water to sponge to him like she says she had been doing. [facility] hospitalist number from [facility] paged for her to speak to them.  6) She c/o his purwick not being changed, this was changed at 9am when he had a massive liquid bowel movement and he was given a bath with full linen and gown change/ She c/o his gown being soiled where it seemed greasy some but the male purwick was no  leaking.I referred her to the full container where the urine was being emptied into.   Charge Nurse Marissa informed.

## 2022-10-14 DIAGNOSIS — I6300 Cerebral infarction due to thrombosis of unspecified precerebral artery: Secondary | ICD-10-CM | POA: Diagnosis not present

## 2022-10-14 DIAGNOSIS — I330 Acute and subacute infective endocarditis: Secondary | ICD-10-CM

## 2022-10-14 LAB — POCT I-STAT 7, (LYTES, BLD GAS, ICA,H+H)
Acid-base deficit: 2 mmol/L (ref 0.0–2.0)
Bicarbonate: 20.5 mmol/L (ref 20.0–28.0)
Calcium, Ion: 1.15 mmol/L (ref 1.15–1.40)
HCT: 46 % (ref 39.0–52.0)
Hemoglobin: 15.6 g/dL (ref 13.0–17.0)
O2 Saturation: 93 %
Patient temperature: 101
Potassium: 4.2 mmol/L (ref 3.5–5.1)
Sodium: 129 mmol/L — ABNORMAL LOW (ref 135–145)
TCO2: 21 mmol/L — ABNORMAL LOW (ref 22–32)
pCO2 arterial: 31.1 mmHg — ABNORMAL LOW (ref 32–48)
pH, Arterial: 7.433 (ref 7.35–7.45)
pO2, Arterial: 68 mmHg — ABNORMAL LOW (ref 83–108)

## 2022-10-14 LAB — BASIC METABOLIC PANEL
Anion gap: 10 (ref 5–15)
BUN: 21 mg/dL (ref 8–23)
CO2: 21 mmol/L — ABNORMAL LOW (ref 22–32)
Calcium: 8.5 mg/dL — ABNORMAL LOW (ref 8.9–10.3)
Chloride: 98 mmol/L (ref 98–111)
Creatinine, Ser: 0.85 mg/dL (ref 0.61–1.24)
GFR, Estimated: >60 mL/min (ref >60)
Glucose, Bld: 230 mg/dL — ABNORMAL HIGH (ref 70–99)
Potassium: 4.3 mmol/L (ref 3.5–5.1)
Sodium: 129 mmol/L — ABNORMAL LOW (ref 135–145)

## 2022-10-14 LAB — CBC
HCT: 45.3 % (ref 39.0–52.0)
Hemoglobin: 15.9 g/dL (ref 13.0–17.0)
MCH: 30.1 pg (ref 26.0–34.0)
MCHC: 35.1 g/dL (ref 30.0–36.0)
MCV: 85.8 fL (ref 80.0–100.0)
Platelets: 195 10*3/uL (ref 150–400)
RBC: 5.28 MIL/uL (ref 4.22–5.81)
RDW: 12.6 % (ref 11.5–15.5)
WBC: 12.5 10*3/uL — ABNORMAL HIGH (ref 4.0–10.5)
nRBC: 0 % (ref 0.0–0.2)

## 2022-10-14 LAB — GLUCOSE, CAPILLARY
Glucose-Capillary: 142 mg/dL — ABNORMAL HIGH (ref 70–99)
Glucose-Capillary: 151 mg/dL — ABNORMAL HIGH (ref 70–99)
Glucose-Capillary: 163 mg/dL — ABNORMAL HIGH (ref 70–99)
Glucose-Capillary: 179 mg/dL — ABNORMAL HIGH (ref 70–99)
Glucose-Capillary: 192 mg/dL — ABNORMAL HIGH (ref 70–99)
Glucose-Capillary: 202 mg/dL — ABNORMAL HIGH (ref 70–99)
Glucose-Capillary: 252 mg/dL — ABNORMAL HIGH (ref 70–99)

## 2022-10-14 LAB — CULTURE, BLOOD (ROUTINE X 2)
Culture: NO GROWTH
Special Requests: ADEQUATE

## 2022-10-14 LAB — HEPARIN LEVEL (UNFRACTIONATED): Heparin Unfractionated: 0.38 IU/mL (ref 0.30–0.70)

## 2022-10-14 MED ORDER — SODIUM CHLORIDE 0.9 % IV SOLN: INTRAVENOUS | Status: DC | PRN

## 2022-10-14 MED ORDER — SODIUM CHLORIDE 3 % IN NEBU
4.0000 mL | INHALATION_SOLUTION | Freq: Two times a day (BID) | RESPIRATORY_TRACT | Status: DC
  Administered 2022-10-14 – 2022-10-16 (×4): 4 mL via RESPIRATORY_TRACT
  Filled 2022-10-14 (×4): qty 4

## 2022-10-14 MED ORDER — FUROSEMIDE 10 MG/ML IJ SOLN
40.0000 mg | Freq: Three times a day (TID) | INTRAMUSCULAR | Status: DC
  Administered 2022-10-14 – 2022-10-16 (×5): 40 mg via INTRAVENOUS
  Filled 2022-10-14 (×5): qty 4

## 2022-10-14 MED ORDER — PNEUMOCOCCAL 20-VAL CONJ VACC 0.5 ML IM SUSY: 0.5000 mL | PREFILLED_SYRINGE | INTRAMUSCULAR | Status: DC | PRN

## 2022-10-14 MED ORDER — ALBUMIN HUMAN 25 % IV SOLN
25.0000 g | Freq: Four times a day (QID) | INTRAVENOUS | Status: AC
  Administered 2022-10-14 – 2022-10-15 (×4): 25 g via INTRAVENOUS
  Filled 2022-10-14 (×4): qty 100

## 2022-10-14 NOTE — Progress Notes (Signed)
ANTICOAGULATION CONSULT NOTE - Follow Up Consult  Pharmacy Consult for heparin Indication: Ventricular pseudoaneurysm  No Known Allergies  Patient Measurements: Height: 6\' 1"  (185.4 cm) Weight: 107.7 kg (237 lb 7 oz) IBW/kg (Calculated) : 79.9 Heparin Dosing Weight: 101kg  Vital Signs: Temp: 99.8 F (37.7 C) (06/29 2000) Temp Source: Oral (06/29 2000) BP: 132/86 (06/29 2200) Pulse Rate: 79 (06/29 2200)  Labs: Recent Labs    10/11/22 0353 10/12/22 0501 10/12/22 1547 10/13/22 0220 10/13/22 0640 10/13/22 1454 10/13/22 2212  HGB 13.7 15.3  --  15.7  --   --   --   HCT 40.8 43.6  --  46.2  --   --   --   PLT 190 178  --  183  --   --   --   HEPARINUNFRC  --   --    < >  --  0.66 0.38 0.21*  CREATININE 0.96 0.93  --  0.85  --   --   --    < > = values in this interval not displayed.     Estimated Creatinine Clearance: 117.5 mL/min (by C-G formula based on SCr of 0.85 mg/dL).   Medical History: Past Medical History:  Diagnosis Date   Abnormal EKG    hx of ischemia showing up on ekg's   Acute myopericarditis    a. 03/2013: readm with hypoxia, tachycardia, elevated ESR/CRP, elevated troponin with Dressler syndrome and myopericarditis; treated with steroids.   Acute respiratory failure (HCC)    a. 01/2013: VDRF. b. 03/2013: hypoxia requiring supp O2 during adm, resolved by discharge.   C. difficile colitis    a. 01/2013 during prolonged adm. b. Recurred 03/2013.   Cardiac tamponade    a. 01/2013 s/p drain.   Cataracts, bilateral    Coronary artery disease    a. s/p MI in 2009 in [place] with stenting of the LCX and LAD;  b. 12/2012 NSTEMI/CAD: LM nl, LAD patent prox stent, LCX 50-70 isr (FFR 0.84), RCA dom, 171m, EF 40-45%-->Med Rx. c. 01/2013: anterolateral STEMI complicated by pericardial effusion (presumed purulent pericarditis) and tamponade s/p drain, ruptured LV pseudoaneurysm s/p CorMatrix patch, CABGx1 (SVG-OM1), fever, CVA, VDRF, C diff.   CVA (cerebral infarction)     a. 01/2013 in setting of prolonged hospitalization - residual L arm weakness.   Dressler syndrome (HCC)    a. 03/2013: readm with hypoxia, tachycardia, elevated ESR/CRP, elevated troponin with Dressler syndrome and myopericarditis; treated with steroids.   Glaucoma    Hyperlipidemia    Hypokalemia    Hyponatremia    a. During late 2014.   Ischemic cardiomyopathy    a. Sept/Oct 2014: EF ~40% (ICM). b. 03/2013: EF 25-30%. Off ACEI due to hypotension (MIXED NICM/ICM).   Optic neuropathy, ischemic    Pericardial effusion    a. 01/2013: pericardial effusion (presumed purulent pericarditis) and cardiac tamponade s/p drain. b. Persistent moderate pericardial effusion 03/2013.   Pre-diabetes    Pseudoaneurysm of left ventricle of heart    a. 01/2013:  Ruptured inferoposterior LV pseudoaneurysm s/p CorMatrix patch.   Sinus bradycardia    asymptomatic   Stroke [facility]) 2009   weak lt side-lt arm   Tobacco abuse    Wears dentures    top   Assessment: 34 YOM with hx LV pseudoaneurysm s/p patch now with large thrombus burden, admitted with ischemic stroke with hemorrhagic conversion, CT stable today and pharmacy consulted to cautiously start heparin gtt for LV pseudoaneurysm, no bolus with low end  goals.  Has been on SQ heparin for DVT ppx with last dose 6/28 @0500 .  6/30 AM update:  Heparin level sub-therapeutic at 0.21 on 1100 units/hr of heparin   Goal of Therapy:  Heparin level 0.3-0.5 units/ml Monitor platelets by anticoagulation protocol: Yes   Plan:  Inc heparin to 1200 units/hr 8 hour heparin level  Abran Duke, PharmD, BCPS Clinical Pharmacist Phone: [phone]

## 2022-10-14 NOTE — Consult Note (Signed)
NAME:  [name], MRN:  [id number], DOB:  May 20, 1960, LOS: 12 ADMISSION DATE:  10/02/2022, CONSULTATION DATE:  10/14/22 REFERRING MD:  Dr. Pearlean Brownie, CHIEF COMPLAINT:  fever   History of Present Illness:   62 year old male with PMH significant for tobacco abuse, CAD s/p CABG, HFrEF/ HFpEF, ICM w/ICD (MRI compatible), myopericarditis/ dresslers syndrome (2014), HTN, HLD, and multiple CVAs initially presenting 6/18 with left hemiplegia, dysarthria, dizziness, and facial droop. Initial CTH negative, treated w/TNK but subsequently later developed right sided deficits found to have left M3 occlusion s/p NIR on 6/19 w/ successful thrombectomy, however complicated by IPH (left frontoparietal) and SAH.  Developed fever 6/19 without leukocytosis with initial negative CXR, UA, and PCT.  Ceftriaxone empirically started 6/25.  Found to have large inferior, thrombus filled, pseudoaneurysm with EF 20-25% (prior 25-30% 2022) on TTE with concern for dehiscence of prior pseudoaneurysm patch in 2014 and source of strokes to be embolic.  Cardiology consulted 6/23, anticoagulation recommended but deferred given recent IPH and SAH.  On 6/26, blood cultures positive for MSSE.  ID consulted 6/27, ceftriaxone switched to cefazolin.  Patient continues to have fevers despite abx with increasing leukocytosis on 6/28.  EP consulted for possible ICD extraction given bacteremia but not a candidate at this time.  Heparin gtt started 6/28.  Repeat blood cultures 6/27 remain NGTD x 3 days. CTH remains stable 6/29 without neuro changes.  Given ongoing fevers, ID requesting TEE in which cardiology would like him intubated for, therefore PCCM consulted.   Pertinent  Medical History  Tobacco abuse, CAD s/p CABG, HFrEF, ICM w/ICD (MRI compatible), pseudoaneurysm inferior wall s/p repair 2014, myopericarditis/ dresslers syndrome (2014), HTN, HLD, CVA, cdiff  Significant Hospital Events: Including procedures, antibiotic start and stop dates  in addition to other pertinent events     Interim History / Subjective:  Patient denies pain  Objective   Blood pressure 115/85, pulse 76, temperature 100 F (37.8 C), resp. rate (!) 25, height 6\' 1"  (1.854 m), weight 107.7 kg, SpO2 96 %.        Intake/Output Summary (Last 24 hours) at 10/14/2022 1442 Last data filed at 10/14/2022 0600 Gross per 24 hour  Intake 1595.72 ml  Output 800 ml  Net 795.72 ml   Filed Weights   10/09/22 0500 10/10/22 0500 10/12/22 0700  Weight: 108.3 kg 107.7 kg 107.7 kg   Examination: General: Ill-appearing man sitting in bed HENT: Facial droop noted, drooling Lungs: Scattered rhonchi, no accessory muscle use Cardiovascular: Heart sounds regular, extremities are warm Abdomen: Soft, positive bowel sounds Extremities: Diffuse anasarca Neuro: Right-sided profoundly weak with flicker withdrawal, moves left side purposefully Nods head appropriately to questions but unable to talk GU: No rashes  Patient Lines/Drains/Airways Status     Active Line/Drains/Airways     Name Placement date Placement time Site Days   Peripheral IV 10/12/22 20 G Anterior;Left Forearm 10/12/22  1016  Forearm  2   Peripheral IV 10/12/22 20 G Anterior;Right Hand 10/12/22  1300  Hand  2   External Urinary Catheter 10/09/22  1945  --  5   Small Bore Feeding Tube Left nare Marking at nare/corner of mouth 66 cm 10/03/22  1540  Left nare  11   Wound / Incision (Open or Dehisced) 10/03/22 Groin Anterior;Proximal;Right Puncture Site 10/03/22  0240  Groin  11             Resolved Hospital Problem list    Assessment & Plan:   MSSE  bacteremia Persistent fever  LV pseudoaneurysm with 1.5cm thrombus  HFrEF/ HFpEF with ICD> not a candidate for ICD extraction per EP Embolic CVA/ IPH/ SAH Acute hypoxemic respiratory failure, combination fluid overload and poor pulmonary toileting  -Trial of hypertonic nebs, CPT -Start standing IV diuretics plus albumin -Goal-directed medical  therapy for his heart failure per cardiology team -Goal-directed medical therapy for his CVA and intra cranial bleed per neurology -From a pulmonary standpoint I do not think he will tolerate TEE well and worried that if he ends up on the ventilator he will inevitably need a tracheostomy.  I worry about his quality of life going forward and agree with primary that a multidisciplinary goals of care discussion may be needed -We will follow  Best Practice (right click and "Reselect all SmartList Selections" daily)  Per primary  Labs   CBC: Recent Labs  Lab 10/10/22 0455 10/11/22 0353 10/12/22 0501 10/13/22 0220 10/14/22 0443 10/14/22 0722  WBC 10.2 9.8 10.2 12.4*  --  12.5*  HGB 13.3 13.7 15.3 15.7 15.6 15.9  HCT 38.5* 40.8 43.6 46.2 46.0 45.3  MCV 84.6 86.4 85.8 88.0  --  85.8  PLT 172 190 178 183  --  195    Basic Metabolic Panel: Recent Labs  Lab 10/09/22 1018 10/10/22 0455 10/11/22 0353 10/12/22 0501 10/13/22 0220 10/14/22 0443 10/14/22 0722  NA 139 135 133* 132* 131* 129* 129*  K 4.2 3.7 3.5 3.9 3.9 4.2 4.3  CL 106 101 98 99 99  --  98  CO2 20* 23 24 22  21*  --  21*  GLUCOSE 114* 161* 138* 117* 126*  --  230*  BUN 18 21 24* 20 20  --  21  CREATININE 0.98 0.97 0.96 0.93 0.85  --  0.85  CALCIUM 8.9 8.5* 8.1* 8.5* 8.3*  --  8.5*  MG 2.2  --   --   --   --   --   --    GFR: Estimated Creatinine Clearance: 117.5 mL/min (by C-G formula based on SCr of 0.85 mg/dL). Recent Labs  Lab 10/11/22 0353 10/12/22 0501 10/13/22 0220 10/14/22 0722  WBC 9.8 10.2 12.4* 12.5*    Liver Function Tests: Recent Labs  Lab 10/09/22 1751  AST 36  ALT 24  ALKPHOS 82  BILITOT 1.9*  PROT 6.5  ALBUMIN 3.1*   No results for input(s): "LIPASE", "AMYLASE" in the last 168 hours. No results for input(s): "AMMONIA" in the last 168 hours.  ABG    Component Value Date/Time   PHART 7.433 10/14/2022 0443   PCO2ART 31.1 (L) 10/14/2022 0443   PO2ART 68 (L) 10/14/2022 0443   HCO3  20.5 10/14/2022 0443   TCO2 21 (L) 10/14/2022 0443   ACIDBASEDEF 2.0 10/14/2022 0443   O2SAT 93 10/14/2022 0443     Coagulation Profile: No results for input(s): "INR", "PROTIME" in the last 168 hours.  Cardiac Enzymes: Recent Labs  Lab 10/09/22 1018 10/10/22 0455  CKTOTAL 610* 490*    HbA1C: Hgb A1c MFr Bld  Date/Time Value Ref Range Status  10/03/2022 12:58 AM 5.8 (H) 4.8 - 5.6 % Final    Comment:    (NOTE) Pre diabetes:          5.7%-6.4%  Diabetes:              >6.4%  Glycemic control for   <7.0% adults with diabetes   05/04/2022 06:21 AM 6.0 (H) 4.8 - 5.6 % Final    Comment:    (  NOTE) Pre diabetes:          5.7%-6.4%  Diabetes:              >6.4%  Glycemic control for   <7.0% adults with diabetes     CBG: Recent Labs  Lab 10/13/22 2000 10/14/22 0056 10/14/22 0401 10/14/22 0841 10/14/22 1124  GLUCAP 193* 151* 142* 252* 163*    Review of Systems:    Positive Symptoms in bold:  Constitutional fevers, chills, weight loss, fatigue, anorexia, malaise  Eyes decreased vision, double vision, eye irritation  Ears, Nose, Mouth, Throat sore throat, trouble swallowing, sinus congestion  Cardiovascular chest pain, paroxysmal nocturnal dyspnea, lower ext edema, palpitations   Respiratory SOB, cough, DOE, hemoptysis, wheezing  Gastrointestinal nausea, vomiting, diarrhea  Genitourinary burning with urination, trouble urinating  Musculoskeletal joint aches, joint swelling, back pain  Integumentary  rashes, skin lesions  Neurological focal weakness, focal numbness, trouble speaking, headaches  Psychiatric depression, anxiety, confusion  Endocrine polyuria, polydipsia, cold intolerance, heat intolerance  Hematologic abnormal bruising, abnormal bleeding, unexplained nose bleeds  Allergic/Immunologic recurrent infections, hives, swollen lymph nodes     Past Medical History:  He,  has a past medical history of Abnormal EKG, Acute myopericarditis, Acute  respiratory failure (HCC), C. difficile colitis, Cardiac tamponade, Cataracts, bilateral, Coronary artery disease, CVA (cerebral infarction), Dressler syndrome (HCC), Glaucoma, Hyperlipidemia, Hypokalemia, Hyponatremia, Ischemic cardiomyopathy, Optic neuropathy, ischemic, Pericardial effusion, Pre-diabetes, Pseudoaneurysm of left ventricle of heart, Sinus bradycardia, Stroke (HCC) (2009), Tobacco abuse, and Wears dentures.   Surgical History:   Past Surgical History:  Procedure Laterality Date   CARDIAC CATHETERIZATION  01/06/2013   CATARACT EXTRACTION Bilateral 05/2021   COLONOSCOPY WITH PROPOFOL N/A 07/03/2016   Procedure: COLONOSCOPY WITH PROPOFOL;  Surgeon: Ruffin Frederick, MD;  Location: [facility];  Service: Gastroenterology;  Laterality: N/A;   CORONARY ANGIOPLASTY WITH STENT PLACEMENT  2000's X 2   "1 + 1" (01/06/2013)   CORONARY ARTERY BYPASS GRAFT N/A 01/28/2013   Procedure: CORONARY ARTERY BYPASS GRAFTING (CABG);  Surgeon: Loreli Slot, MD;  Location: [facility] OR;  Service: Open Heart Surgery;  Laterality: N/A;  CABG x one, using left greater saphenous vein harvested endoscopically   ICD IMPLANT N/A 05/25/2020   Procedure: ICD IMPLANT;  Surgeon: Hillis Range, MD;  Location: [facility] INVASIVE CV LAB;  Service: Cardiovascular;  Laterality: N/A;   INTRAOPERATIVE TRANSESOPHAGEAL ECHOCARDIOGRAM N/A 01/28/2013   Procedure: INTRAOPERATIVE TRANSESOPHAGEAL ECHOCARDIOGRAM;  Surgeon: Loreli Slot, MD;  Location[facility] OR;  Service: Open Heart Surgery;  Laterality: N/A;   IR CT HEAD LTD  10/03/2022   IR PERCUTANEOUS ART THROMBECTOMY/INFUSION INTRACRANIAL INC DIAG ANGIO  10/03/2022   IR US GUIDE VASC ACCESS RIGHT  10/03/2022   LEFT HEART CATH AND CORS/GRAFTS ANGIOGRAPHY N/A 09/13/2020   Procedure: LEFT HEART CATH AND CORS/GRAFTS ANGIOGRAPHY;  Surgeon: Swaziland, Peter M, MD;  Location: [facility] INVASIVE CV LAB;  Service: Cardiovascular;  Laterality: N/A;   LEFT HEART CATHETERIZATION WITH CORONARY  ANGIOGRAM N/A 01/06/2013   Procedure: LEFT HEART CATHETERIZATION WITH CORONARY ANGIOGRAM;  Surgeon: Peter M Swaziland, MD;  Location: [facility] CATH LAB;  Service: Cardiovascular;  Laterality: N/A;   LEFT HEART CATHETERIZATION WITH CORONARY ANGIOGRAM N/A 01/21/2013   Procedure: LEFT HEART CATHETERIZATION WITH CORONARY ANGIOGRAM;  Surgeon: Kathleene Hazel, MD;  Location: [facility] CATH LAB;  Service: Cardiovascular;  Laterality: N/A;   LUMBAR LAMINECTOMY/ DECOMPRESSION WITH MET-RX Right 06/30/2018   Procedure: Right minimally invasive Lumbar Three-Four Far lateral discectomy;  Surgeon: Jadene Pierini, MD;  Location: [facility] OR;  Service: Neurosurgery;  Laterality: Right;  Right minimally invasive Lumbar Three-Four Far lateral discectomy   MASS EXCISION Right 07/21/2014   Procedure: EXCISION RIGHT NECK MASS;  Surgeon: Harriette Bouillon, MD;  Location: [facility];  Service: General;  Laterality: Right;   PERICARDIAL TAP N/A 01/24/2013   Procedure: PERICARDIAL TAP;  Surgeon: Micheline Chapman, MD;  Location: [facility] CATH LAB;  Service: Cardiovascular;  Laterality: N/A;   RADIOLOGY WITH ANESTHESIA N/A 10/03/2022   Procedure: IR WITH ANESTHESIA;  Surgeon: Julieanne Cotton, MD;  Location: [facility] OR;  Service: Radiology;  Laterality: N/A;   RIGHT HEART CATHETERIZATION Right 01/24/2013   Procedure: RIGHT HEART CATH;  Surgeon: Micheline Chapman, MD;  Location: [facility] CATH LAB;  Service: Cardiovascular;  Laterality: Right;   VENTRICULAR ANEURYSM RESECTION N/A 01/28/2013   Procedure: LEFT VENTRICULAR ANEURYSM REPAIR;  Surgeon: Loreli Slot, MD;  Location: [facility] OR;  Service: Open Heart Surgery;  Laterality: N/A;     Social History:   reports that he has been smoking cigarettes. He has a 35.00 pack-year smoking history. He has never used smokeless tobacco. He reports that he does not drink alcohol and does not use drugs.   Family History:  His family history includes CAD in an other family member; Diabetes in his  mother; HIV in his brother; Heart attack in his mother; Hypertension in his father and mother. There is no history of Stroke.   Allergies No Known Allergies   Home Medications  Prior to Admission medications   Medication Sig Start Date End Date Taking? Authorizing Provider  ALPHAGAN P 0.1 % SOLN INSTILL 1 DROP INTO BOTH EYES TWICE A DAY Patient taking differently: Place 1 drop into both eyes in the morning and at bedtime. 10/26/19  Yes [name], MD  aspirin 81 MG EC tablet Take 1 tablet (81 mg total) by mouth daily. Restart on 07/05/2018 06/30/18  Yes Jadene Pierini, MD  atorvastatin (LIPITOR) 80 MG tablet Take 1 tablet (80 mg total) by mouth daily. 06/07/22  Yes [name], MD  carvedilol (COREG) 25 MG tablet Take 0.5 tablets (12.5 mg total) by mouth 2 (two) times daily with a meal. Patient taking differently: Take 25 mg by mouth 2 (two) times daily with a meal. 05/08/22  Yes [name], MD  Cholecalciferol (VITAMIN D3) 50 MCG (2000 UT) TABS Take 1 tablet by mouth daily.   Yes [provider]  clopidogrel (PLAVIX) 75 MG tablet Take 1 tablet (75 mg total) by mouth daily. 06/20/22  Yes [name], MD  dapagliflozin propanediol (FARXIGA) 10 MG TABS tablet TAKE 1 TABLET EVERY DAY Patient taking differently: Take 10 mg by mouth daily. 12/20/21  Yes Allred, Fayrene Fearing, MD  docusate sodium (COLACE) 100 MG capsule Take 100 mg by mouth 2 (two) times daily.   Yes [provider]  ENTRESTO 97-103 MG TAKE 1 TABLET TWICE DAILY Patient taking differently: Take 1 tablet by mouth 2 (two) times daily. 05/02/22  Yes [place], Peter M, MD  furosemide (LASIX) 20 MG tablet Take 1 tablet (20 mg total) by mouth daily. 02/01/21  Yes [place], Peter M, MD  gabapentin (NEURONTIN) 300 MG capsule Take 1 capsule (300 mg total) by mouth at bedtime. 09/18/21  Yes [name], MD  latanoprost (XALATAN) 0.005 % ophthalmic solution Place 1 drop into both eyes at bedtime. 05/31/16  Yes  [provider]  Multiple Vitamins-Minerals (CENTRUM MEN) TABS Take 1 tablet by mouth  daily.   Yes [provider]  spironolactone (ALDACTONE) 25 MG tablet Take 0.5 tablets (12.5 mg total) by mouth daily. 05/08/22  Yes [name], MD  fluticasone (FLONASE) 50 MCG/ACT nasal spray Place 1 spray into both nostrils daily. 08/12/21   Carlisle Beers, FNP  nitroGLYCERIN (NITROSTAT) 0.4 MG SL tablet Place 1 tablet (0.4 mg total) under the tongue every 5 (five) minutes x 3 doses as needed for chest pain. 09/14/20   Arty Baumgartner, NP     Critical care time: N/A

## 2022-10-14 NOTE — Progress Notes (Signed)
ID Brief Note  89 YM admitted with MSSE bacteremia with ICD and LV thrombus SP pseudoaneurysm repair #MSSE bacteremia with LV thrombus, new CVA/embolic #ICD Fever and leukocytosis -6/24 2/2 blood Cx+ MSSE->pt on cefazoline, follow repeat Cx to ensure clearance --Cards following and pt not a candidate for redo cardiac surgery for LV pseudoaneurysm with thrombus -EP noted pt not currently candidate for lead extraction -Recommended TEE as pt continues to fever/leukocytosis. Pulmonary consulted as cards would like hoim intubated for TEE, pulm noted that pt will not tolerate TEE at this point and may end up on ventilator recc goals of care. -

## 2022-10-14 NOTE — Plan of Care (Signed)
Patient remains in Neuro ICU at time of writing. Patient remains on supplemental O2 via [place] with a Cheyne-Stokes pattern of breathing. Neuro exam Q 1 hour: see flowsheet. High risk for need for airway intubation; airway clearance measures (coughing, positioning, and deep breathing) are encouraged. Patient remains high risk for fall, injury.    Problem: Education: Goal: Ability to describe self-care measures that may prevent or decrease complications (Diabetes Survival Skills Education) will improve Outcome: Not Progressing Goal: Individualized Educational Video(s) Outcome: Not Progressing   Problem: Coping: Goal: Ability to adjust to condition or change in health will improve Outcome: Not Progressing   Problem: Fluid Volume: Goal: Ability to maintain a balanced intake and output will improve Outcome: Not Progressing   Problem: Health Behavior/Discharge Planning: Goal: Ability to identify and utilize available resources and services will improve Outcome: Not Progressing Goal: Ability to manage health-related needs will improve Outcome: Not Progressing   Problem: Metabolic: Goal: Ability to maintain appropriate glucose levels will improve Outcome: Not Progressing   Problem: Nutritional: Goal: Maintenance of adequate nutrition will improve Outcome: Not Progressing Goal: Progress toward achieving an optimal weight will improve Outcome: Not Progressing   Problem: Skin Integrity: Goal: Risk for impaired skin integrity will decrease Outcome: Not Progressing   Problem: Tissue Perfusion: Goal: Adequacy of tissue perfusion will improve Outcome: Not Progressing

## 2022-10-14 NOTE — Progress Notes (Signed)
   10/14/22 0400  Provider Notification  Provider Name/Title Otelia Limes MD  Date Provider Notified 10/14/22  Time Provider Notified 0330  Method of Notification Page  Notification Reason Change in status (Respiratory status is worsening. Patient having worsening Cheyne Stokes breathing with increased interval. Work of breathing increasing, with accesory muscle use and pursed lips. No neuro changes. Patient is grimacing, hx of chronic back pain)  Provider response No new orders (Considered different treatment options. Patient is not a candidate for BiPAP or CPAP due to neurological status, and inability to remove mask. Consulted RT, new known treatment from them as well)  Date of Provider Response 10/14/22  Time of Provider Response 0400   Stroke MD, not comfortable prescribing narcotics or muscle relaxer, caution not to knock out respiratory drive. Patient repositioned, pressure taken off back with pillows and PRN tylenol given.

## 2022-10-14 NOTE — Progress Notes (Addendum)
STROKE TEAM PROGRESS NOTE   INTERVAL HISTORY Patient continues to have low-grade fevers, but his blood pressure remains stable.  ID still requesting TEE, cardiology would like him intubated for it. Will discuss with CCM  Tmax still 101 for 24 hours despite antibiotics  Neurological exam is unchanged.  Vitals:   10/14/22 0700 10/14/22 0800 10/14/22 0900 10/14/22 1000  BP: (!) 142/82 (!) 135/90 116/80 115/85  Pulse: 82 83 81 76  Resp: (!) 33 (!) 31 (!) 35 (!) 25  Temp:  100.2 F (37.9 C)    TempSrc:  Axillary    SpO2: 96% 92% 96% 96%  Weight:      Height:       CBC:  Recent Labs  Lab 10/13/22 0220 10/14/22 0443 10/14/22 0722  WBC 12.4*  --  12.5*  HGB 15.7 15.6 15.9  HCT 46.2 46.0 45.3  MCV 88.0  --  85.8  PLT 183  --  195    Basic Metabolic Panel:  Recent Labs  Lab 10/09/22 1018 10/10/22 0455 10/13/22 0220 10/14/22 0443 10/14/22 0722  NA 139   < > 131* 129* 129*  K 4.2   < > 3.9 4.2 4.3  CL 106   < > 99  --  98  CO2 20*   < > 21*  --  21*  GLUCOSE 114*   < > 126*  --  230*  BUN 18   < > 20  --  21  CREATININE 0.98   < > 0.85  --  0.85  CALCIUM 8.9   < > 8.3*  --  8.5*  MG 2.2  --   --   --   --    < > = values in this interval not displayed.     IMAGING past 24 hours No results found.   Physical Exam  Constitutional: Appears well-developed and well-nourished.  Middle-aged African-American male Cardiovascular: Normal rate and regular rhythm.  Respiratory:  Cheyne-Stokes respiratory pattern GI: Soft.  No distension. There is no tenderness.  Skin: WDI  Neuro: Mental Status: Patient is awake, alert, speech is dysarthric with expressive aphasia. .Follows simple midline commands, intermittently following commands on the left side Cranial Nerves: II: Pupils are equal, round, and reactive to light.   III,IV, VI: EOMI without ptosis or diplopia.  VII: Mild right facial droop  VIII: Hearing is intact to voice XII: tongue is midline without atrophy or  fasciculations.  Motor: Tone is normal. Bulk is normal.  Moves left upper and lower extremities spontaneously, withdraws right lower extremity to noxious, slight movement of right upper extremity     ASSESSMENT/PLAN [name] is a 62 y.o. male with history of CAD s/p PCI w/ stent to LCS and LAD, s/p 1v CABG, multiple CVAs (R cerebellar 04/2022),  syscolic HF s/p ICD, HLD, HTN presenting with slurred speech and L sided weakness now s/p TNK and IR thrombectomy of L MCA (M3 segment) c/b  hemorrhage.   TIA - Right brain stroke aborted s/p TNK and later that day second stroke with left M2 occlusion s/p IR Stroke - Left MCA and right cerebellar infarcts left M3 occlusion s/p IR with TICIs, complicated by L frontoparietal ICH and SAH, etiology likely cardioembolic source  CT 6/18 no acute finding, but the bilateral frontal, parietal, occipital and cerebellum infarcts CTA head and neck unremarkable CTA head and neck repeat new short segment occlusion of distal left M2 S/p IR with left M3 TICIs reperfusion  post  IR CT Hyperdensity within the left sylvian fissure may represent small subarachnoid hemorrhage versus contrast. CT Head: Progressed hemorrhage in L frontoparietal region w/ 3cm hematoma and regional [facility] MRI  progressive L fronto parietal hematoma (~7cm), regional SAH, increased mass effect.  Likely underlying infarct. Sm R cerebellar infarct, acute. Numerous chornic cerebral and cerebellar infarcts Repeat CT 6/19 increase in size of the left frontal/parietal hematoma. New extension into the left lateral ventricle. CT repeat 6/22 x 2 - New component of intraparenchymal hemorrhage in the posterior right hemisphere that measures approximately 2.5 x 1.4 cm. (On my review, more like right brain redistribution instead of new hemorrhage)  Repeat CT 6/23: Unchanged appearance of bilateral mixed intraparenchymal and subarachnoid hemorrhage. Trace rightward midline shift is unchanged. No  hydrocephalus CT repeat 6/25 stable hemorrhage, unchanged CT Head 6/28-  stable hematoma. SAH and IVH have regressed since 10/03/2022.  Echo: Large inferior pseudoaneurysm with a depth of 3 cm and a maximum width of roughly 5 cm the wall of the pseudoaneurysm is made above the layer of thrombus that is roughly 1.5 cm thick. EF 30 to 35%, severe hypokinesis of left ventricle. No afib with ICD interrogation LE venous doppler neg for DVT TCD bubble study showed moderate sized PFO LDL 63 HgbA1c 5.8 UDS neg VTE prophylaxis - SQH  aspirin 81 mg daily and clopidogrel 75 mg daily prior to admission, now on IV Heparin per stroke protocol Therapy recommendations:  SNF Disposition:  pending  CHF s/p ICD Systolic HF, s/p ICD 2/2 ICM.  Echo EF 30-35%  Trop 45 -> 43 -> 39 -> 35 CK 3714, CKMB 3.5 CXR 6/25 central vascular congestion and developing interstitial edema.  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  Cardiology on board, appreciate assistance  CTA of the heart intact chronic patch repair to inferior and inferior septal pseudoaneurysm, large burden of thrombus on top of patch repair, severe biventricular failure with D-shaped septum, occluded SVG to OM Heparin IV started 6/28 Ultimately patient will need to repeat undergo repeat pseudoaneurysm repair if he becomes healthy enough to tolerate  surgery  Hyperlipidemia Home meds:  atorvastatin 80 LDL 63, goal < 70 Lipitor 80 resumed    Continue statin at discharge  Tobacco abuse Current smoker Smoking cessation counseling will be provided Nicotine patch provided  Fever ? Aspiration pneumonia Tmax 101.6 -> 99.8-> afebrile->100.8 -> 102-> 101.7-> 101.1 -> 101 WBC 7.6->9.4->10.2->9.8->10.2 UA negative CXR central vascular congestion and developing interstitial edema.  CXR 6/26 similar appearance of atelectasis at both lung bases Procalc negative.  Blood culture 2/4 bottles staph epidermidis, ID consulted, appreciate recommendations Blood culture repeat pending -no growth x 3 days NT suction PRN Ceftriaxone -> ancef ID consulted EP on board - not candidate for ICD extraction at this time Cardiology felt pt not candidate for TEE now given respiratory distress, however they are willing to do it if he is intubated  Dysphagia  Vomiting  on zofran PRN, improved Pt ate well 6/24, nocturnal TF d/c'ed -> fever with possible aspiration -> NPO and TF restarted on 6/25 6/27 having difficulty coordinating swallowing and respirations  SLP will need to re evaluate  PFO Known PFO in the past TCD bubble study showed a moderate-sized PFO LE venous Doppler negative for DVT  Not an emergent issue at this time  Other stroke risk factors Obesity with BMI 32.23 CAD  Other Active Problems Thrombocytopenia, platelet 110->89-> 172-> 190-> stable CK 436 -> 3714 -> 610-> 490  Hospital day # 12  Patient seen and examined by NP/APP with MD. MD to update note as needed.   Elmer Picker, DNP, FNP-BC [facility] Pager: [phone] I have personally obtained history,examined this patient, reviewed notes, independently viewed imaging studies, participated in medical decision making and plan of care.ROS completed by me personally and pertinent positives fully documented  I have made any additions or clarifications directly to  the above note. Agree with note above.  Patient continues to have fever despite several days of antibiotics and recent blood cultures have been negative.  ID wish patient to have a TEE with cardiology is reluctant as patient may need intubation in the be able to be extubated able.  Will need to discuss goals of care with the family.  Discussed with Dr. Katrinka Blazing critical care medicine and Dr. Thedore Mins ID team.  Discussed with Dr. Lalla Brothers EP team who feels patient is not a candidate for removal of ICD replacement at this time.  This patient is critically ill and at significant risk of neurological worsening, death and care requires constant monitoring of vital signs, hemodynamics,respiratory and cardiac monitoring, extensive review of multiple databases, frequent neurological assessment, discussion with family, other specialists and medical decision making of high complexity.I have made any additions or clarifications directly to the above note.This critical care time does not reflect procedure time, or teaching time or supervisory time of PA/NP/Med Resident etc but could involve care discussion time.  I spent 40 minutes of neurocritical care time  in the care of  this patient.      Delia Heady, MD Medical Director [facility] Pager: [phone] 10/14/2022 2:40 PM

## 2022-10-14 NOTE — Progress Notes (Addendum)
ANTICOAGULATION CONSULT NOTE - Follow Up Consult  Pharmacy Consult for heparin Indication: Ventricular pseudoaneurysm  No Known Allergies  Patient Measurements: Height: 6\' 1"  (185.4 cm) Weight: 107.7 kg (237 lb 7 oz) IBW/kg (Calculated) : 79.9 Heparin Dosing Weight: 101kg  Vital Signs: Temp: 101 F (38.3 C) (06/30 0400) Temp Source: Axillary (06/30 0400) BP: 135/90 (06/30 0800) Pulse Rate: 83 (06/30 0800)  Labs: Recent Labs    10/12/22 0501 10/12/22 1547 10/13/22 0220 10/13/22 0640 10/13/22 1454 10/13/22 2212 10/14/22 0443 10/14/22 0722  HGB 15.3  --  15.7  --   --   --  15.6 15.9  HCT 43.6  --  46.2  --   --   --  46.0 45.3  PLT 178  --  183  --   --   --   --  195  HEPARINUNFRC  --    < >  --    < > 0.38 0.21*  --  0.38  CREATININE 0.93  --  0.85  --   --   --   --  0.85   < > = values in this interval not displayed.    Estimated Creatinine Clearance: 117.5 mL/min (by C-G formula based on SCr of 0.85 mg/dL).   Medical History: Past Medical History:  Diagnosis Date   Abnormal EKG    hx of ischemia showing up on ekg's   Acute myopericarditis    a. 03/2013: readm with hypoxia, tachycardia, elevated ESR/CRP, elevated troponin with Dressler syndrome and myopericarditis; treated with steroids.   Acute respiratory failure (HCC)    a. 01/2013: VDRF. b. 03/2013: hypoxia requiring supp O2 during adm, resolved by discharge.   C. difficile colitis    a. 01/2013 during prolonged adm. b. Recurred 03/2013.   Cardiac tamponade    a. 01/2013 s/p drain.   Cataracts, bilateral    Coronary artery disease    a. s/p MI in 2009 in [place] with stenting of the LCX and LAD;  b. 12/2012 NSTEMI/CAD: LM nl, LAD patent prox stent, LCX 50-70 isr (FFR 0.84), RCA dom, 173m, EF 40-45%-->Med Rx. c. 01/2013: anterolateral STEMI complicated by pericardial effusion (presumed purulent pericarditis) and tamponade s/p drain, ruptured LV pseudoaneurysm s/p CorMatrix patch, CABGx1 (SVG-OM1), fever, CVA,  VDRF, C diff.   CVA (cerebral infarction)    a. 01/2013 in setting of prolonged hospitalization - residual L arm weakness.   Dressler syndrome (HCC)    a. 03/2013: readm with hypoxia, tachycardia, elevated ESR/CRP, elevated troponin with Dressler syndrome and myopericarditis; treated with steroids.   Glaucoma    Hyperlipidemia    Hypokalemia    Hyponatremia    a. During late 2014.   Ischemic cardiomyopathy    a. Sept/Oct 2014: EF ~40% (ICM). b. 03/2013: EF 25-30%. Off ACEI due to hypotension (MIXED NICM/ICM).   Optic neuropathy, ischemic    Pericardial effusion    a. 01/2013: pericardial effusion (presumed purulent pericarditis) and cardiac tamponade s/p drain. b. Persistent moderate pericardial effusion 03/2013.   Pre-diabetes    Pseudoaneurysm of left ventricle of heart    a. 01/2013:  Ruptured inferoposterior LV pseudoaneurysm s/p CorMatrix patch.   Sinus bradycardia    asymptomatic   Stroke [facility]) 2009   weak lt side-lt arm   Tobacco abuse    Wears dentures    top   Assessment: 17 YOM with hx LV pseudoaneurysm s/p patch now with large thrombus burden, admitted with ischemic stroke with hemorrhagic conversion, CT stable today and pharmacy consulted  to cautiously start heparin gtt for LV pseudoaneurysm, no bolus with low end goals.  Has been on SQ heparin for DVT ppx with last dose 6/28 @0500 .  Heparin level 0.38 - on Heparin 1200 units/hr.  CBC stable.  Goal of Therapy:  Heparin level 0.3-0.5 units/ml Monitor platelets by anticoagulation protocol: Yes   Plan:  Continue heparin to 1200 units/hr Daily Heparin level.   Link Snuffer, PharmD, BCPS, BCCCP Clinical Pharmacist Please refer to [facility] for [facility] Pharmacy numbers

## 2022-10-15 ENCOUNTER — Ambulatory Visit: Payer: Medicare PPO | Admitting: Physical Therapy

## 2022-10-15 DIAGNOSIS — I63312 Cerebral infarction due to thrombosis of left middle cerebral artery: Secondary | ICD-10-CM | POA: Diagnosis not present

## 2022-10-15 DIAGNOSIS — I330 Acute and subacute infective endocarditis: Secondary | ICD-10-CM | POA: Diagnosis not present

## 2022-10-15 LAB — GLUCOSE, CAPILLARY
Glucose-Capillary: 143 mg/dL — ABNORMAL HIGH (ref 70–99)
Glucose-Capillary: 145 mg/dL — ABNORMAL HIGH (ref 70–99)
Glucose-Capillary: 158 mg/dL — ABNORMAL HIGH (ref 70–99)
Glucose-Capillary: 185 mg/dL — ABNORMAL HIGH (ref 70–99)
Glucose-Capillary: 186 mg/dL — ABNORMAL HIGH (ref 70–99)
Glucose-Capillary: 212 mg/dL — ABNORMAL HIGH (ref 70–99)

## 2022-10-15 LAB — CULTURE, BLOOD (ROUTINE X 2): Culture: NO GROWTH

## 2022-10-15 LAB — CBC
HCT: 41.7 % (ref 39.0–52.0)
Hemoglobin: 14.2 g/dL (ref 13.0–17.0)
MCH: 29.1 pg (ref 26.0–34.0)
MCHC: 34.1 g/dL (ref 30.0–36.0)
MCV: 85.5 fL (ref 80.0–100.0)
Platelets: 171 10*3/uL (ref 150–400)
RBC: 4.88 MIL/uL (ref 4.22–5.81)
RDW: 12.6 % (ref 11.5–15.5)
WBC: 9.2 10*3/uL (ref 4.0–10.5)
nRBC: 0 % (ref 0.0–0.2)

## 2022-10-15 LAB — HEPARIN LEVEL (UNFRACTIONATED): Heparin Unfractionated: 0.34 IU/mL (ref 0.30–0.70)

## 2022-10-15 MED ORDER — BANATROL TF EN LIQD
60.0000 mL | Freq: Two times a day (BID) | ENTERAL | Status: DC
  Administered 2022-10-15 – 2022-10-23 (×18): 60 mL
  Filled 2022-10-15 (×18): qty 60

## 2022-10-15 MED ORDER — INSULIN ASPART 100 UNIT/ML IJ SOLN
0.0000 [IU] | INTRAMUSCULAR | Status: DC
  Administered 2022-10-15: 2 [IU] via SUBCUTANEOUS
  Administered 2022-10-15: 3 [IU] via SUBCUTANEOUS
  Administered 2022-10-15: 5 [IU] via SUBCUTANEOUS
  Administered 2022-10-16: 2 [IU] via SUBCUTANEOUS
  Administered 2022-10-16: 3 [IU] via SUBCUTANEOUS
  Administered 2022-10-16: 2 [IU] via SUBCUTANEOUS

## 2022-10-15 NOTE — Progress Notes (Signed)
NAME:  [name], MRN:  [id number], DOB:  [birth year], LOS: 13 ADMISSION DATE:  10/02/2022, CONSULTATION DATE:  10/14/2022 REFERRING MD:  Dr. Pearlean Brownie, CHIEF COMPLAINT:  Fever   History of Present Illness:  62 year old male with PMH significant for tobacco abuse, CAD s/p CABG, HFrEF/ HFpEF, ICM w/ICD (MRI compatible), myopericarditis/ dresslers syndrome (2014), HTN, HLD, and multiple CVAs initially presenting 6/18 with left hemiplegia, dysarthria, dizziness, and facial droop. Initial CTH negative, treated w/TNK but subsequently later developed right sided deficits found to have left M3 occlusion s/p NIR on 6/19 w/ successful thrombectomy, however complicated by IPH (left frontoparietal) and SAH. Developed fever 6/19 without leukocytosis with initial negative CXR, UA, and PCT. Ceftriaxone empirically started 6/25. Found to have large inferior, thrombus filled, pseudoaneurysm with EF 20-25% (prior 25-30% 2022) on TTE with concern for dehiscence of prior pseudoaneurysm patch in 2014 and source of strokes to be embolic. Cardiology consulted 6/23, anticoagulation recommended but deferred given recent IPH and SAH. On 6/26, blood cultures positive for MSSE. ID consulted 6/27, ceftriaxone switched to cefazolin. Patient continues to have fevers despite abx with increasing leukocytosis on 6/28. EP consulted for possible ICD extraction given bacteremia but not a candidate at this time. Heparin gtt started 6/28. Repeat blood cultures 6/27 remain NGTD x 3 days. CTH remains stable 6/29 without neuro changes. Given ongoing fevers, ID requesting TEE in which cardiology would like him intubated for, therefore PCCM consulted.   Pertinent  Medical History  Tobacco abuse, CAD s/p CABG, HFrEF, ICM w/ICD (MRI compatible), pseudoaneurysm inferior wall s/p repair 2014, myopericarditis/ dresslers syndrome (2014), HTN, HLD, CVA, cdiff   Significant Hospital Events: Including procedures, antibiotic start and stop dates in  addition to other pertinent events     Interim History / Subjective:  Follows some commands on LUE and LLE, wiggled toes on R to command however sluggishly and no movement in RUE. Remains on 3L [place] on my interview, auscultated post nebulizer treatment and appears CTA BL.  Objective   Blood pressure 117/71, pulse 78, temperature 99 F (37.2 C), temperature source Oral, resp. rate (!) 21, height 6\' 1"  (1.854 m), weight 100.9 kg, SpO2 95 %.        Intake/Output Summary (Last 24 hours) at 10/15/2022 0838 Last data filed at 10/15/2022 0800 Gross per 24 hour  Intake 2536.99 ml  Output 4350 ml  Net -1813.01 ml   Filed Weights   10/10/22 0500 10/12/22 0700 10/15/22 0400  Weight: 107.7 kg 107.7 kg 100.9 kg    Examination:  Appears well-developed and well-nourished.  Middle-aged African-American male Cardiovascular: Normal rate and regular rhythm.  Respiratory:  Cheyne-Stokes respiratory pattern GI: Soft.  No distension. There is no tenderness.  Skin: WDI   Neuro: Mental Status: Patient is awake, alert. Follows simple midline commands, intermittently following commands on the left side, Follows sluggishly on RLE however no movement of RUE on my interview Cranial Nerves: II: Pupils are equal, round, and reactive to light.   III,IV, VI: EOMI without ptosis or diplopia.  VII: Mild right facial droop  VIII: Hearing is intact to voice XII: tongue is midline without atrophy or fasciculations.  Motor: Tone is normal. Bulk is normal.  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  Repeat BC  drawn from 6/27 show NGTD and WBC normalized. Tmax appears to be 100.71f overnight- however this could be reactive due to brain bleed amongst other ideations vs MSSE bactermia.  ID was consulted and recommended pt to have ICD removed however pt not a candidate for this currently. Cardiology was also consulted and request pt to have TEE and they would like him intubated for this.. previously noted that pt may not tolerate TEE and may end up with difficulty removing ETT post procedure.  -Tmax overnight 100.27F, WBC normalized, continue to monitor and trend- Fevers could be secondary to brain bleed, MSSE Bacteremia or other etiology.  -6/24  two of two blood Cx (+) MSSE->pt on cefazoline, follow repeat Cx to ensure clearance (NGTD) -Cards following- pt not a candidate for redo cardiac surgery for LV pseudoaneurysm with thrombus -EP noted pt not currently candidate for lead extraction -Goal-directed medical therapy for his heart failure per cardiology team -Goal-directed medical therapy for his CVA and intra cranial bleed per neurology   Acute hypoxemic respiratory failure combination of fluid overload and poor pulmonary toileting. On my exam pt resting on 3L [place] post nebulizer treatment and lungs CTA BL. -Continue hypertonic nebs, CPT -IV diuretics plus albumin -Continue to monitor and wean o2 as able for goal SPO2 >92%   HTN HLD CVA -Goal-directed medical therapy for his heart failure per cardiology team -Continue home medications- statin, carvedilol, lasix, metoprolol, entresto, and spironolactone    Nutrition -continue TF with Osmolite at 40ml/hr and PROsource 60ml daily.   Best Practice (right click and "Reselect all SmartList Selections" daily)   Diet/type: tubefeeds DVT prophylaxis: systemic heparin GI prophylaxis: PPI Lines: N/A Foley:  N/A Code Status:  full code Last date of multidisciplinary goals of care discussion [Per Primary]  Labs   CBC: Recent Labs  Lab 10/11/22 0353 10/12/22 0501 10/13/22 0220 10/14/22 0443 10/14/22 0722 10/15/22 0421  WBC 9.8 10.2 12.4*  --  12.5* 9.2  HGB 13.7 15.3 15.7 15.6 15.9 14.2  HCT 40.8 43.6 46.2 46.0  45.3 41.7  MCV 86.4 85.8 88.0  --  85.8 85.5  PLT 190 178 183  --  195 171    Basic Metabolic Panel: Recent Labs  Lab 10/09/22 1018 10/10/22 0455 10/11/22 0353 10/12/22 0501 10/13/22 0220 10/14/22 0443 10/14/22 0722  NA 139 135 133* 132* 131* 129* 129*  K 4.2 3.7 3.5 3.9 3.9 4.2 4.3  CL 106 101 98 99 99  --  98  CO2 20* 23 24 22  21*  --  21*  GLUCOSE 114* 161* 138* 117* 126*  --  230*  BUN 18 21 24* 20 20  --  21  CREATININE 0.98 0.97 0.96 0.93 0.85  --  0.85  CALCIUM 8.9 8.5* 8.1* 8.5* 8.3*  --  8.5*  MG 2.2  --   --   --   --   --   --    GFR: Estimated Creatinine Clearance: 114 mL/min (by C-G formula based on SCr of 0.85 mg/dL). Recent Labs  Lab 10/12/22 0501 10/13/22 0220 10/14/22 0722 10/15/22 0421  WBC 10.2 12.4* 12.5* 9.2    Liver Function Tests: Recent Labs  Lab 10/09/22 1751  AST 36  ALT 24  ALKPHOS 82  BILITOT 1.9*  PROT 6.5  ALBUMIN 3.1*   No results for input(s): "LIPASE", "AMYLASE" in the last 168 hours. No results for input(s): "AMMONIA" in the last 168 hours.  ABG    Component Value Date/Time  PHART 7.433 10/14/2022 0443   PCO2ART 31.1 (L) 10/14/2022 0443   PO2ART 68 (L) 10/14/2022 0443   HCO3 20.5 10/14/2022 0443   TCO2 21 (L) 10/14/2022 0443   ACIDBASEDEF 2.0 10/14/2022 0443   O2SAT 93 10/14/2022 0443     Coagulation Profile: No results for input(s): "INR", "PROTIME" in the last 168 hours.  Cardiac Enzymes: Recent Labs  Lab 10/09/22 1018 10/10/22 0455  CKTOTAL 610* 490*    HbA1C: Hgb A1c MFr Bld  Date/Time Value Ref Range Status  10/03/2022 12:58 AM 5.8 (H) 4.8 - 5.6 % Final    Comment:    (NOTE) Pre diabetes:          5.7%-6.4%  Diabetes:              >6.4%  Glycemic control for   <7.0% adults with diabetes   05/04/2022 06:21 AM 6.0 (H) 4.8 - 5.6 % Final    Comment:    (NOTE) Pre diabetes:          5.7%-6.4%  Diabetes:              >6.4%  Glycemic control for   <7.0% adults with diabetes      CBG: Recent Labs  Lab 10/14/22 1547 10/14/22 2003 10/14/22 2345 10/15/22 0332 10/15/22 0740  GLUCAP 179* 192* 202* 185* 158*    Review of Systems:   (-) except as reviewed in HPI and POC.  Past Medical History:  He,  has a past medical history of Abnormal EKG, Acute myopericarditis, Acute respiratory failure (HCC), C. difficile colitis, Cardiac tamponade, Cataracts, bilateral, Coronary artery disease, CVA (cerebral infarction), Dressler syndrome (HCC), Glaucoma, Hyperlipidemia, Hypokalemia, Hyponatremia, Ischemic cardiomyopathy, Optic neuropathy, ischemic, Pericardial effusion, Pre-diabetes, Pseudoaneurysm of left ventricle of heart, Sinus bradycardia, Stroke (HCC) (2009), Tobacco abuse, and Wears dentures.   Surgical History:   Past Surgical History:  Procedure Laterality Date   CARDIAC CATHETERIZATION  01/06/2013   CATARACT EXTRACTION Bilateral 05/2021   COLONOSCOPY WITH PROPOFOL N/A 07/03/2016   Procedure: COLONOSCOPY WITH PROPOFOL;  Surgeon: Ruffin Frederick, MD;  Location: [facility];  Service: Gastroenterology;  Laterality: N/A;   CORONARY ANGIOPLASTY WITH STENT PLACEMENT  2000's X 2   "1 + 1" (01/06/2013)   CORONARY ARTERY BYPASS GRAFT N/A 01/28/2013   Procedure: CORONARY ARTERY BYPASS GRAFTING (CABG);  Surgeon: Loreli Slot, MD;  Location: [facility] OR;  Service: Open Heart Surgery;  Laterality: N/A;  CABG x one, using left greater saphenous vein harvested endoscopically   ICD IMPLANT N/A 05/25/2020   Procedure: ICD IMPLANT;  Surgeon: Hillis Range, MD;  Location: [facility] INVASIVE CV LAB;  Service: Cardiovascular;  Laterality: N/A;   INTRAOPERATIVE TRANSESOPHAGEAL ECHOCARDIOGRAM N/A 01/28/2013   Procedure: INTRAOPERATIVE TRANSESOPHAGEAL ECHOCARDIOGRAM;  Surgeon: Loreli Slot, MD;  Location: [facility] OR;  Service: Open Heart Surgery;  Laterality: N/A;   IR CT HEAD LTD  10/03/2022   IR PERCUTANEOUS ART THROMBECTOMY/INFUSION INTRACRANIAL INC DIAG ANGIO  10/03/2022    IR US GUIDE VASC ACCESS RIGHT  10/03/2022   LEFT HEART CATH AND CORS/GRAFTS ANGIOGRAPHY N/A 09/13/2020   Procedure: LEFT HEART CATH AND CORS/GRAFTS ANGIOGRAPHY;  Surgeon: Swaziland, Peter M, MD;  Location: [facility] INVASIVE CV LAB;  Service: Cardiovascular;  Laterality: N/A;   LEFT HEART CATHETERIZATION WITH CORONARY ANGIOGRAM N/A 01/06/2013   Procedure: LEFT HEART CATHETERIZATION WITH CORONARY ANGIOGRAM;  Surgeon: Peter M Swaziland, MD;  Location: [facility] CATH LAB;  Service: Cardiovascular;  Laterality: N/A;   LEFT HEART CATHETERIZATION WITH CORONARY ANGIOGRAM N/A  01/21/2013   Procedure: LEFT HEART CATHETERIZATION WITH CORONARY ANGIOGRAM;  Surgeon: Kathleene Hazel, MD;  Location: [facility] CATH LAB;  Service: Cardiovascular;  Laterality: N/A;   LUMBAR LAMINECTOMY/ DECOMPRESSION WITH MET-RX Right 06/30/2018   Procedure: Right minimally invasive Lumbar Three-Four Far lateral discectomy;  Surgeon: Jadene Pierini, MD;  Location: [facility] OR;  Service: Neurosurgery;  Laterality: Right;  Right minimally invasive Lumbar Three-Four Far lateral discectomy   MASS EXCISION Right 07/21/2014   Procedure: EXCISION RIGHT NECK MASS;  Surgeon: Harriette Bouillon, MD;  Location: [facility];  Service: General;  Laterality: Right;   PERICARDIAL TAP N/A 01/24/2013   Procedure: PERICARDIAL TAP;  Surgeon: Micheline Chapman, MD;  Location: [facility] CATH LAB;  Service: Cardiovascular;  Laterality: N/A;   RADIOLOGY WITH ANESTHESIA N/A 10/03/2022   Procedure: IR WITH ANESTHESIA;  Surgeon: Julieanne Cotton, MD;  Location: [facility] OR;  Service: Radiology;  Laterality: N/A;   RIGHT HEART CATHETERIZATION Right 01/24/2013   Procedure: RIGHT HEART CATH;  Surgeon: Micheline Chapman, MD;  Location: [facility] CATH LAB;  Service: Cardiovascular;  Laterality: Right;   VENTRICULAR ANEURYSM RESECTION N/A 01/28/2013   Procedure: LEFT VENTRICULAR ANEURYSM REPAIR;  Surgeon: Loreli Slot, MD;  Location: [facility] OR;  Service: Open Heart Surgery;  Laterality: N/A;      Social History:   reports that he has been smoking cigarettes. He has a 35.00 pack-year smoking history. He has never used smokeless tobacco. He reports that he does not drink alcohol and does not use drugs.   Family History:  His family history includes CAD in an other family member; Diabetes in his mother; HIV in his brother; Heart attack in his mother; Hypertension in his father and mother. There is no history of Stroke.   Allergies No Known Allergies   Home Medications  Prior to Admission medications   Medication Sig Start Date End Date Taking? Authorizing Provider  ALPHAGAN P 0.1 % SOLN INSTILL 1 DROP INTO BOTH EYES TWICE A DAY Patient taking differently: Place 1 drop into both eyes in the morning and at bedtime. 10/26/19  Yes [name], MD  aspirin 81 MG EC tablet Take 1 tablet (81 mg total) by mouth daily. Restart on 07/05/2018 06/30/18  Yes Jadene Pierini, MD  atorvastatin (LIPITOR) 80 MG tablet Take 1 tablet (80 mg total) by mouth daily. 06/07/22  Yes [name], MD  carvedilol (COREG) 25 MG tablet Take 0.5 tablets (12.5 mg total) by mouth 2 (two) times daily with a meal. Patient taking differently: Take 25 mg by mouth 2 (two) times daily with a meal. 05/08/22  Yes [name], MD  Cholecalciferol (VITAMIN D3) 50 MCG (2000 UT) TABS Take 1 tablet by mouth daily.   Yes [provider]  clopidogrel (PLAVIX) 75 MG tablet Take 1 tablet (75 mg total) by mouth daily. 06/20/22  Yes [name], MD  dapagliflozin propanediol (FARXIGA) 10 MG TABS tablet TAKE 1 TABLET EVERY DAY Patient taking differently: Take 10 mg by mouth daily. 12/20/21  Yes Allred, Fayrene Fearing, MD  docusate sodium (COLACE) 100 MG capsule Take 100 mg by mouth 2 (two) times daily.   Yes [provider]  ENTRESTO 97-103 MG TAKE 1 TABLET TWICE DAILY Patient taking differently: Take 1 tablet by mouth 2 (two) times daily. 05/02/22  Yes [place], Peter M, MD  furosemide (LASIX) 20 MG  tablet Take 1 tablet (20 mg total) by mouth daily. 02/01/21  Yes [place], Peter M, MD  gabapentin (NEURONTIN) 300 MG capsule Take 1 capsule (300 mg total) by mouth at bedtime. 09/18/21  Yes [name], MD  latanoprost (XALATAN) 0.005 % ophthalmic solution Place 1 drop into both eyes at bedtime. 05/31/16  Yes [provider]  Multiple Vitamins-Minerals (CENTRUM MEN) TABS Take 1 tablet by mouth daily.   Yes [provider]  spironolactone (ALDACTONE) 25 MG tablet Take 0.5 tablets (12.5 mg total) by mouth daily. 05/08/22  Yes [name], MD  fluticasone (FLONASE) 50 MCG/ACT nasal spray Place 1 spray into both nostrils daily. 08/12/21   Carlisle Beers, FNP  nitroGLYCERIN (NITROSTAT) 0.4 MG SL tablet Place 1 tablet (0.4 mg total) under the tongue every 5 (five) minutes x 3 doses as needed for chest pain. 09/14/20   Arty Baumgartner, NP     Critical care time: 45 minutes    Janeece Riggers, AGACNP-BC [facility] For pager details, please see AMION or use EPIC chat After 1900, please call [facility] for cross coverage needs 10/15/2022 8:38 AM

## 2022-10-15 NOTE — Progress Notes (Addendum)
STROKE TEAM PROGRESS NOTE   INTERVAL HISTORY Patient remains hemodynamically stable with continued low-grade fevers.  Continues to have oral secretions and some trouble protecting his airway but so far has avoided reintubation.  He remains on heparin for cardiac pseudoaneurysm with thrombus.  He is deemed to be too unstable for intubation for TEE, so we will continue to treat with antibiotics.  EP deems that he is not a candidate for ICD lead extraction.  Awaiting family meeting and palliative care input.  Vitals:   10/15/22 0900 10/15/22 1000 10/15/22 1100 10/15/22 1204  BP: 111/81 94/71 100/75   Pulse: 71 77 70   Resp: (!) 23 (!) 25 14   Temp:    99.2 F (37.3 C)  TempSrc:    Oral  SpO2: 96% 94% 98%   Weight:      Height:       CBC:  Recent Labs  Lab 10/14/22 0722 10/15/22 0421  WBC 12.5* 9.2  HGB 15.9 14.2  HCT 45.3 41.7  MCV 85.8 85.5  PLT 195 171    Basic Metabolic Panel:  Recent Labs  Lab 10/09/22 1018 10/10/22 0455 10/13/22 0220 10/14/22 0443 10/14/22 0722  NA 139   < > 131* 129* 129*  K 4.2   < > 3.9 4.2 4.3  CL 106   < > 99  --  98  CO2 20*   < > 21*  --  21*  GLUCOSE 114*   < > 126*  --  230*  BUN 18   < > 20  --  21  CREATININE 0.98   < > 0.85  --  0.85  CALCIUM 8.9   < > 8.3*  --  8.5*  MG 2.2  --   --   --   --    < > = values in this interval not displayed.     IMAGING past 24 hours No results found.   Physical Exam  Constitutional: Appears well-developed and well-nourished.  Middle-aged African-American male Cardiovascular: Normal rate and regular rhythm.  Respiratory: Regular, unlabored respirations on supplemental O2 GI: Soft.  No distension. There is no tenderness.  Skin: WDI  Neuro: Mental Status: Patient is awake, alert, speech is dysarthric with expressive aphasia. .Follows simple midline commands, following commands on the left side Cranial Nerves: II: Pupils are equal, round, and reactive to light.   III,IV, VI: EOMI without  ptosis or diplopia.  VII: Mild right facial droop  VIII: Hearing is intact to voice XII: tongue is midline without atrophy or fasciculations.  Motor: Tone is normal. Bulk is normal.  Moves left upper and lower extremities spontaneously, does not move right upper or lower extremities     ASSESSMENT/PLAN [name] is a 62 y.o. male with history of CAD s/p PCI w/ stent to LCS and LAD, s/p 1v CABG, multiple CVAs (R cerebellar 04/2022),  syscolic HF s/p ICD, HLD, HTN presenting with slurred speech and L sided weakness now s/p TNK and IR thrombectomy of L MCA (M3 segment) c/b  hemorrhage.   TIA - Right brain stroke aborted s/p TNK and later that day second stroke with left M2 occlusion s/p IR Stroke - Left MCA and right cerebellar infarcts left M3 occlusion s/p IR with TICIs, complicated by L frontoparietal ICH and SAH, etiology likely cardioembolic source  CT 6/18 no acute finding, but the bilateral frontal, parietal, occipital and cerebellum infarcts CTA head and neck unremarkable CTA head and neck repeat new short  segment occlusion of distal left M2 S/p IR with left M3 TICIs reperfusion  post IR CT Hyperdensity within the left sylvian fissure may represent small subarachnoid hemorrhage versus contrast. CT Head: Progressed hemorrhage in L frontoparietal region w/ 3cm hematoma and regional SAH MRI  progressive L fronto parietal hematoma (~7cm), regional SAH, increased mass effect.  Likely underlying infarct. Sm R cerebellar infarct, acute. Numerous chornic cerebral and cerebellar infarcts Repeat CT 6/19 increase in size of the left frontal/parietal hematoma. New extension into the left lateral ventricle. CT repeat 6/22 x 2 - New component of intraparenchymal hemorrhage in the posterior right hemisphere that measures approximately 2.5 x 1.4 cm. (On my review, more like right brain redistribution instead of new hemorrhage)  Repeat CT 6/23: Unchanged appearance of bilateral mixed  intraparenchymal and subarachnoid hemorrhage. Trace rightward midline shift is unchanged. No hydrocephalus CT repeat 6/25 stable hemorrhage, unchanged CT Head 6/28-  stable hematoma. SAH and IVH have regressed since 10/03/2022.  Echo: Large inferior pseudoaneurysm with a depth of 3 cm and a maximum width of roughly 5 cm the wall of the pseudoaneurysm is made above the layer of thrombus that is roughly 1.5 cm thick. EF 30 to 35%, severe hypokinesis of left ventricle. No afib with ICD interrogation LE venous doppler neg for DVT TCD bubble study showed moderate sized PFO LDL 63 HgbA1c 5.8 UDS neg VTE prophylaxis - SQH  aspirin 81 mg daily and clopidogrel 75 mg daily prior to admission, now on IV Heparin per stroke protocol Therapy recommendations:  SNF Disposition:  pending  CHF s/p ICD Systolic HF, s/p ICD 2/2 ICM.  Echo EF 30-35%  Trop 45 -> 43 -> 39 -> 35 CK 3714, CKMB 3.5 CXR 6/25 central vascular congestion and developing interstitial edema.  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  Cardiology on board, appreciate assistance  CTA of the heart intact chronic patch repair to inferior and inferior septal pseudoaneurysm, large burden of thrombus on top of patch repair, severe biventricular failure with D-shaped septum, occluded SVG to OM Heparin IV started 6/28 Ultimately patient will  need to repeat undergo repeat pseudoaneurysm repair if he becomes healthy enough to tolerate surgery  Hyperlipidemia Home meds:  atorvastatin 80 LDL 63, goal < 70 Lipitor 80 resumed    Continue statin at discharge  Tobacco abuse Current smoker Smoking cessation counseling will be provided Nicotine patch provided  Fever ? Aspiration pneumonia Tmax 101.6 -> 99.8-> afebrile->100.8 -> 102-> 101.7-> 101.1 -> 101-> 100.3 WBC 7.6->9.4->10.2->9.8->10.2-> 9.2 UA negative CXR central vascular congestion and developing interstitial edema.  CXR 6/26 similar appearance of atelectasis at both lung bases Procalc negative.  Blood culture 2/4 bottles staph epidermidis, ID consulted, appreciate recommendations Blood culture repeat pending -no growth x 3 days NT suction PRN Ceftriaxone -> ancef ID consulted EP on board - not candidate for ICD extraction at this time Cardiology felt pt not candidate for TEE now given  respiratory distress, however they are willing to do it if he is intubated  Dysphagia  Vomiting on zofran PRN, improved Pt ate well 6/24, nocturnal TF d/c'ed -> fever with possible aspiration -> NPO and TF restarted on 6/25 6/27 having difficulty coordinating swallowing and respirations  SLP will need to re evaluate  PFO Known PFO in the past TCD bubble study showed a moderate-sized PFO LE venous Doppler negative for DVT  Not an emergent issue at this time  Other stroke risk factors Obesity with BMI 32.23 CAD  Other Active Problems Thrombocytopenia, platelet 110->89-> 172-> 190-> stable CK 436 -> 3714 -> 610-> 490  Hospital day # 13  Patient seen and examined by NP/APP with MD. MD to update note as needed.   Cortney E Ernestina Columbia , MSN, AGACNP-BC [facility] See Amion for schedule and pager information 10/15/2022 12:37 PM  STROKE MD NOTE :  I have personally obtained history,examined this patient, reviewed notes, independently viewed imaging studies,  participated in medical decision making and plan of care.ROS completed by me personally and pertinent positives fully documented  I have made any additions or clarifications directly to the above note. Agree with note above.  Patient remains febrile despite antibiotics but fever is not high-grade.  Continue close monitoring of respiratory status.  Await family meeting with son and significant other.  Discussed with critical care team and cardiology. He is not a candidate for intubation just for TEE at the present time or for defibrillator lead extraction..  Continue IV heparin for now.This patient is critically ill and at significant risk of neurological worsening, death and care requires constant monitoring of vital signs, hemodynamics,respiratory and cardiac monitoring, extensive review of multiple databases, frequent neurological assessment, discussion with family, other specialists and medical decision making of high complexity.I have made any additions or clarifications directly to the above note.This critical care time does not reflect procedure time, or teaching time or supervisory time of PA/NP/Med Resident etc but could involve care discussion time.  I spent 30 minutes of neurocritical care time  in the care of  this patient.      Delia Heady, MD Medical Director [facility] Pager: [phone] 10/15/2022 2:21 PM

## 2022-10-15 NOTE — Progress Notes (Signed)
Went to bedside to revisit clinical status Chart reviewed CCM/pulmonary team is bedside D/w Dr. Denese Killings he does not think he is currently candidate for procedures/intubation yet, probably not for some time. From his perspective Did mention some purposeful interaction today.  EP will remain in the periphery. Please recall if/when he becomes a procedural/surgical candidate  Robert Dowse, PA-C

## 2022-10-15 NOTE — Progress Notes (Signed)
Speech Language Pathology Treatment: Cognitive-Linquistic  Patient Details Name: [name] MRN: [id number] DOB: Apr 08, 1961 Today's Date: 10/15/2022 Time: 4098-1191 SLP Time Calculation (min) (ACUTE ONLY): 25 min  Assessment / Plan / Recommendation Clinical Impression  Pt was seen for speech-language treatment. He was alert and cooperative during the session. He responded to simple yes/no questions with 80% accuracy increasing to 100% with repetition. He achieved 50% accuracy with complex yes/no questions given rephrasing and visual prompts. He followed 1-step commands and completed confrontational naming with 30% accuracy. Linguistic error analysis was challenging due to the severity of pt's dysarthria. Pt was educated regarding the nature of aphasia and dysarthria and his deficits, but the extent of his comprehension is questioned. He was educated on use of increased vocal intensity and used this inconsistently with models. Pt's overall respiratory status was improved compared to last week, but periods of tachypnea continue to be noted with RR in the 30s and reportedly(per RN) up to 40s; p.o. trials were therefore deferred. SLP will continue to follow pt.     HPI HPI: Pt is a 62 y/o male who presented 6/19 with L weakness and numbness. Pt received TNK then developed new symptoms of aphasia and R weakness. He underwent mechanical thrombectomy L M3 segment MCA 6/19. Pt noted to have small hemorrhage and follow up CT showed progression of hemorrhage 3cm then 7cm and small acute R cerebellar infarct. PMH: CAD s/p PCI w/stent and 1v CABG, multiple CGAs, systolic HF s/p ICD, HLD, HTN.      SLP Plan  Continue with current plan of care      Recommendations for follow up therapy are one component of a multi-disciplinary discharge planning process, led by the attending physician.  Recommendations may be updated based on patient status, additional functional criteria and insurance authorization.     Recommendations  Diet recommendations: NPO Medication Administration: Via alternative means                  Oral care BID   Frequent or constant Supervision/Assistance Dysarthria and anarthria (R47.1);Cognitive communication deficit (R41.841);Aphasia (R47.01)     Continue with current plan of care   Marelyn Rouser I. Vear Clock, MS, CCC-SLP Neuro Diagnostic Specialist  Acute Rehabilitation Services Office number: [phone]  Scheryl Marten  10/15/2022, 2:38 PM

## 2022-10-15 NOTE — Progress Notes (Signed)
Nutrition Follow-up  DOCUMENTATION CODES:   Not applicable  INTERVENTION:   Tube feeding per Cortrak tube:  Osmolite 1.5 @65  ml/hr (1560 ml/day) Prosource TF20 daily    TF's at goal rate provides 2340 kcal, 118 g protein, 1188 ml fluid  Add Banatrol BID   NUTRITION DIAGNOSIS:   Inadequate oral intake related to inability to eat as evidenced by NPO status. Ongoing.   GOAL:   Patient will meet greater than or equal to 90% of their needs Met with TF at goal  MONITOR:   Diet advancement, Labs, Weight trends, TF tolerance, I & O's  REASON FOR ASSESSMENT:   Consult Enteral/tube feeding initiation and management  ASSESSMENT:   62 y.o. male presented to the ED with L side weakness, slurred speech, slow speech, and feeling dizzy. PMH includes CAD, CHF, HLD, and HTN. Pt admitted for stroke.  Pt discussed during ICU rounds and with RN.  Per CCM following some commands Pt having fevers, per CCM may need TEE, possible ICD removal but pt not yet a candidate for this.   6/18 - Admitted; s/p TNK 6/19 - s/p thrombectomy of L M3; failed bedside swallow; Cortrak placed; tip distal stomach  Medications reviewed and include: prosource, lasix, SSI, MVI with minerals, protonix, miralax, spironolactone Albumin  Labs reviewed:  Na 129 CBG's: 143-252  UOP: 4350 ml I&O since admission: +2.2 L  Admission weight: 108.6 kg Current weight: 100.9 kg  Diet Order:   Diet Order             Diet NPO time specified  Diet effective now                   EDUCATION NEEDS:   Not appropriate for education at this time  Skin:  Skin Assessment: Reviewed RN Assessment  Last BM:  6/30 large; type 7  Height:   Ht Readings from Last 1 Encounters:  10/03/22 6\' 1"  (1.854 m)    Weight:   Wt Readings from Last 1 Encounters:  10/15/22 100.9 kg    Ideal Body Weight:  83.6 kg  BMI:  Body mass index is 29.35 kg/m.  Estimated Nutritional Needs:   Kcal:  2300-2500  Protein:   115-130 grams  Fluid:  >/= 2 L  Delontae Lamm P., RD, LDN, CNSC See AMiON for contact information

## 2022-10-15 NOTE — Progress Notes (Signed)
Orthopedic Tech Progress Note Patient Details:  [name] 1960-08-28 [id number]  Ortho Devices Type of Ortho Device: Prafo boot/shoe Ortho Device/Splint Location: RLE Ortho Device/Splint Interventions: Ordered, Application, Adjustment   Post Interventions Patient Tolerated: Well Instructions Provided: Care of device  [name] 10/15/2022, 7:03 PM

## 2022-10-15 NOTE — Progress Notes (Signed)
ANTICOAGULATION CONSULT NOTE - Follow Up Consult  Pharmacy Consult for heparin Indication: Ventricular pseudoaneurysm  No Known Allergies  Patient Measurements: Height: 6\' 1"  (185.4 cm) Weight: 100.9 kg (222 lb 7.1 oz) IBW/kg (Calculated) : 79.9 Heparin Dosing Weight: 101kg  Vital Signs: Temp: 99 F (37.2 C) (07/01 0800) Temp Source: Oral (07/01 0800) BP: 117/71 (07/01 0802) Pulse Rate: 78 (07/01 0802)  Labs: Recent Labs    10/13/22 0220 10/13/22 0640 10/13/22 2212 10/14/22 0443 10/14/22 0722 10/15/22 0421  HGB 15.7  --   --  15.6 15.9 14.2  HCT 46.2  --   --  46.0 45.3 41.7  PLT 183  --   --   --  195 171  HEPARINUNFRC  --    < > 0.21*  --  0.38 0.34  CREATININE 0.85  --   --   --  0.85  --    < > = values in this interval not displayed.     Estimated Creatinine Clearance: 114 mL/min (by C-G formula based on SCr of 0.85 mg/dL).   Medical History: Past Medical History:  Diagnosis Date   Abnormal EKG    hx of ischemia showing up on ekg's   Acute myopericarditis    a. 03/2013: readm with hypoxia, tachycardia, elevated ESR/CRP, elevated troponin with Dressler syndrome and myopericarditis; treated with steroids.   Acute respiratory failure (HCC)    a. 01/2013: VDRF. b. 03/2013: hypoxia requiring supp O2 during adm, resolved by discharge.   C. difficile colitis    a. 01/2013 during prolonged adm. b. Recurred 03/2013.   Cardiac tamponade    a. 01/2013 s/p drain.   Cataracts, bilateral    Coronary artery disease    a. s/p MI in 2009 in [place] with stenting of the LCX and LAD;  b. 12/2012 NSTEMI/CAD: LM nl, LAD patent prox stent, LCX 50-70 isr (FFR 0.84), RCA dom, 127m, EF 40-45%-->Med Rx. c. 01/2013: anterolateral STEMI complicated by pericardial effusion (presumed purulent pericarditis) and tamponade s/p drain, ruptured LV pseudoaneurysm s/p CorMatrix patch, CABGx1 (SVG-OM1), fever, CVA, VDRF, C diff.   CVA (cerebral infarction)    a. 01/2013 in setting of prolonged  hospitalization - residual L arm weakness.   Dressler syndrome (HCC)    a. 03/2013: readm with hypoxia, tachycardia, elevated ESR/CRP, elevated troponin with Dressler syndrome and myopericarditis; treated with steroids.   Glaucoma    Hyperlipidemia    Hypokalemia    Hyponatremia    a. During late 2014.   Ischemic cardiomyopathy    a. Sept/Oct 2014: EF ~40% (ICM). b. 03/2013: EF 25-30%. Off ACEI due to hypotension (MIXED NICM/ICM).   Optic neuropathy, ischemic    Pericardial effusion    a. 01/2013: pericardial effusion (presumed purulent pericarditis) and cardiac tamponade s/p drain. b. Persistent moderate pericardial effusion 03/2013.   Pre-diabetes    Pseudoaneurysm of left ventricle of heart    a. 01/2013:  Ruptured inferoposterior LV pseudoaneurysm s/p CorMatrix patch.   Sinus bradycardia    asymptomatic   Stroke [facility]) 2009   weak lt side-lt arm   Tobacco abuse    Wears dentures    top   Assessment: 23 YOM with hx LV pseudoaneurysm s/p patch now with large thrombus burden, admitted with ischemic stroke with hemorrhagic conversion, CT stable today and pharmacy consulted to cautiously start heparin gtt for LV pseudoaneurysm, no bolus with low end goals.    Heparin level 0.34 - on Heparin 1200 units/hr.  CBC stable.  Goal of Therapy:  Heparin level 0.3-0.5 units/ml Monitor platelets by anticoagulation protocol: Yes   Plan:  Continue heparin 1200 units/hr Daily Heparin level and CBC Monitor for signs/symptoms of bleeding  Eldridge Scot, PharmD Clinical Pharmacist Please refer to [facility] for [facility] Pharmacy numbers

## 2022-10-15 NOTE — TOC Progression Note (Signed)
Transition of Care [facility]) - Progression Note    Patient Details  Name: [name] MRN: [id number] Date of Birth: 1960-06-29  Transition of Care [facility]) CM/SW Contact  Mearl Latin, LCSW Phone Number: 10/15/2022, 5:17 PM  Clinical Narrative:    CSW continuing to follow.    Expected Discharge Plan: Skilled Nursing Facility Barriers to Discharge: Continued Medical Work up, English as a second language teacher, SNF Pending bed offer  Expected Discharge Plan and Services In-house Referral: Clinical Social Work     Living arrangements for the past 2 months: Apartment                                       Social Determinants of Health (SDOH) Interventions SDOH Screenings   Food Insecurity: Patient Unable To Answer (10/11/2022)  Housing: Patient Unable To Answer (10/11/2022)  Transportation Needs: Patient Unable To Answer (10/11/2022)  Utilities: Patient Unable To Answer (10/11/2022)  Depression (PHQ2-9): Low Risk  (05/08/2022)  Tobacco Use: High Risk (10/04/2022)    Readmission Risk Interventions     No data to display

## 2022-10-15 NOTE — Progress Notes (Signed)
Orthopedic Tech Progress Note Patient Details:  [name] 09-16-1960 [id number]  Called in order to [employer] for a RESTING HAND SPLINT   Patient ID: [name], male   DOB: 1961-01-06, 62 y.o.   MRN: [id number]  [name] 10/15/2022, 6:38 PM

## 2022-10-15 NOTE — Progress Notes (Signed)
Physical Therapy Treatment Patient Details Name: [name] MRN: [id number] DOB: 1960-08-08 Today's Date: 10/15/2022   History of Present Illness Patient is a 62 y/o male admitted 10/03/22 with L weakness and numbness.  Received TNK then developed now symptoms of aphasia and R weakness.  He underwent mechanical thrombectomy L M3 segment MCA noted to have small hemorrhage and follow up CT showed progression of hemorrhage 3cm then 7cm and small acute R cerebellar infarct. PMH positive for CAD s/p PCI w/stent and 1v CABG, multiple CGAs, systolic HF s/p ICD, HLD, HTN.    PT Comments  Patient limited by pain seemingly L hip today not allowing PT to assist to sitting pushing back to R and holding his L hip.  Able to position in bed in sitting position and performed stretching to L hip with pt indicating pain with stretching.  Noted per visitor he had issues with back pain on that side in the past.  PT will continue to follow.  May attempt pre-medication next session to see if tolerates better.  Also asked RN to order [facility] and resting hand splint for R hand/foot.      Assistance Recommended at Discharge Frequent or constant Supervision/Assistance  If plan is discharge home, recommend the following:  Can travel by private vehicle    Two people to help with walking and/or transfers;A lot of help with bathing/dressing/bathroom;Assistance with cooking/housework;Direct supervision/assist for medications management;Assist for transportation;Help with stairs or ramp for entrance;Assistance with feeding   No  Equipment Recommendations  Other (comment) (TBA)    Recommendations for Other Services       Precautions / Restrictions Precautions Precautions: Fall Precaution Comments: R inattention; SBP <160     Mobility  Bed Mobility Overal bed mobility: Needs Assistance Bed Mobility: Rolling, Sidelying to Sit Rolling: Mod assist Sidelying to sit: Max assist Supine to sit: Max assist      General bed mobility comments: attempted up to EOB wtih +1 A but pt pushing back to R when lifting trunk to sit so returned to sidelying x 2 but pt still resisting coming upright holding his L hip so replaced legs into bed and +2 to scoot to [facility] and positioned into chair position in bed    Transfers                   General transfer comment: NT pt not tolerating sitting EOB today    Ambulation/Gait                   Stairs             Wheelchair Mobility     Tilt Bed    Modified Rankin (Stroke Patients Only) Modified Rankin (Stroke Patients Only) Pre-Morbid Rankin Score: Moderate disability Modified Rankin: Severe disability     Balance     Sitting balance-Leahy Scale: Zero Sitting balance - Comments: pushing to R when attempting to sit                                    Cognition Arousal/Alertness: Lethargic Behavior During Therapy: Flat affect Overall Cognitive Status: Difficult to assess                                          Exercises Other Exercises Other Exercises: PROM R  LE and UE in supine with stretch to heel cord, and for extending fingers Other Exercises: AAROM/stretch to L LE with stretch to hip rotators and hamstrings    General Comments        Pertinent Vitals/Pain Pain Assessment Pain Assessment: Faces Faces Pain Scale: Hurts even more Pain Location: holding L hip when mobilizing and grimacing with stretching of hip Pain Descriptors / Indicators: Grimacing, Discomfort Pain Intervention(s): Monitored during session, Repositioned, Other (comment) (stretching)    Home Living                          Prior Function            PT Goals (current goals can now be found in the care plan section) Progress towards PT goals: Not progressing toward goals - comment    Frequency    Min 3X/week      PT Plan Current plan remains appropriate    Co-evaluation               AM-PAC PT "6 Clicks" Mobility   Outcome Measure  Help needed turning from your back to your side while in a flat bed without using bedrails?: Total Help needed moving from lying on your back to sitting on the side of a flat bed without using bedrails?: Total Help needed moving to and from a bed to a chair (including a wheelchair)?: Total Help needed standing up from a chair using your arms (e.g., wheelchair or bedside chair)?: Total Help needed to walk in hospital room?: Total Help needed climbing 3-5 steps with a railing? : Total 6 Click Score: 6    End of Session   Activity Tolerance: Patient limited by pain Patient left: in bed;with call bell/phone within reach;with bed alarm set   PT Visit Diagnosis: Other abnormalities of gait and mobility (R26.89);Other symptoms and signs involving the nervous system (R29.898);Hemiplegia and hemiparesis Hemiplegia - Right/Left: Right Hemiplegia - dominant/non-dominant: Dominant Hemiplegia - caused by: Cerebral infarction;Other Nontraumatic intracranial hemorrhage     Time: 1633-1700 PT Time Calculation (min) (ACUTE ONLY): 27 min  Charges:    $Therapeutic Activity: 23-37 mins PT General Charges $$ ACUTE PT VISIT: 1 Visit                     Sheran Lawless, PT Acute Rehabilitation Services [phone] 10/15/2022    [name] 10/15/2022, 6:07 PM

## 2022-10-16 DIAGNOSIS — Z7189 Other specified counseling: Secondary | ICD-10-CM

## 2022-10-16 DIAGNOSIS — R7881 Bacteremia: Secondary | ICD-10-CM | POA: Diagnosis not present

## 2022-10-16 DIAGNOSIS — I6300 Cerebral infarction due to thrombosis of unspecified precerebral artery: Secondary | ICD-10-CM | POA: Diagnosis not present

## 2022-10-16 DIAGNOSIS — I330 Acute and subacute infective endocarditis: Secondary | ICD-10-CM | POA: Diagnosis not present

## 2022-10-16 DIAGNOSIS — I639 Cerebral infarction, unspecified: Secondary | ICD-10-CM | POA: Diagnosis not present

## 2022-10-16 DIAGNOSIS — Z515 Encounter for palliative care: Secondary | ICD-10-CM

## 2022-10-16 DIAGNOSIS — T827XXA Infection and inflammatory reaction due to other cardiac and vascular devices, implants and grafts, initial encounter: Secondary | ICD-10-CM | POA: Diagnosis not present

## 2022-10-16 DIAGNOSIS — B957 Other staphylococcus as the cause of diseases classified elsewhere: Secondary | ICD-10-CM | POA: Diagnosis not present

## 2022-10-16 LAB — BASIC METABOLIC PANEL
Anion gap: 15 (ref 5–15)
BUN: 26 mg/dL — ABNORMAL HIGH (ref 8–23)
CO2: 26 mmol/L (ref 22–32)
Calcium: 9.3 mg/dL (ref 8.9–10.3)
Chloride: 89 mmol/L — ABNORMAL LOW (ref 98–111)
Creatinine, Ser: 0.89 mg/dL (ref 0.61–1.24)
GFR, Estimated: >60 mL/min (ref >60)
Glucose, Bld: 157 mg/dL — ABNORMAL HIGH (ref 70–99)
Potassium: 4.5 mmol/L (ref 3.5–5.1)
Sodium: 130 mmol/L — ABNORMAL LOW (ref 135–145)

## 2022-10-16 LAB — CBC
HCT: 42.9 % (ref 39.0–52.0)
Hemoglobin: 14.5 g/dL (ref 13.0–17.0)
MCH: 29.3 pg (ref 26.0–34.0)
MCHC: 33.8 g/dL (ref 30.0–36.0)
MCV: 86.7 fL (ref 80.0–100.0)
Platelets: 195 K/uL (ref 150–400)
RBC: 4.95 MIL/uL (ref 4.22–5.81)
RDW: 12.7 % (ref 11.5–15.5)
WBC: 7.9 K/uL (ref 4.0–10.5)
nRBC: 0 % (ref 0.0–0.2)

## 2022-10-16 LAB — HEPARIN LEVEL (UNFRACTIONATED): Heparin Unfractionated: 0.39 [IU]/mL (ref 0.30–0.70)

## 2022-10-16 LAB — GLUCOSE, CAPILLARY
Glucose-Capillary: 131 mg/dL — ABNORMAL HIGH (ref 70–99)
Glucose-Capillary: 185 mg/dL — ABNORMAL HIGH (ref 70–99)
Glucose-Capillary: 165 mg/dL — ABNORMAL HIGH (ref 70–99)
Glucose-Capillary: 203 mg/dL — ABNORMAL HIGH (ref 70–99)
Glucose-Capillary: 206 mg/dL — ABNORMAL HIGH (ref 70–99)

## 2022-10-16 LAB — CULTURE, BLOOD (ROUTINE X 2): Special Requests: ADEQUATE

## 2022-10-16 LAB — MAGNESIUM: Magnesium: 2.8 mg/dL — ABNORMAL HIGH (ref 1.7–2.4)

## 2022-10-16 LAB — PHOSPHORUS: Phosphorus: 3.9 mg/dL (ref 2.5–4.6)

## 2022-10-16 MED ORDER — FUROSEMIDE 8 MG/ML PO SOLN
40.0000 mg | Freq: Two times a day (BID) | ORAL | Status: DC
  Filled 2022-10-16: qty 5

## 2022-10-16 MED ORDER — FUROSEMIDE 10 MG/ML PO SOLN
40.0000 mg | Freq: Two times a day (BID) | ORAL | Status: DC
  Administered 2022-10-16 – 2022-10-21 (×10): 40 mg
  Filled 2022-10-16 (×15): qty 4

## 2022-10-16 MED ORDER — INSULIN ASPART 100 UNIT/ML IJ SOLN
0.0000 [IU] | INTRAMUSCULAR | Status: DC
  Administered 2022-10-16: 3 [IU] via SUBCUTANEOUS
  Administered 2022-10-16 (×2): 7 [IU] via SUBCUTANEOUS
  Administered 2022-10-17 (×2): 4 [IU] via SUBCUTANEOUS
  Administered 2022-10-17 (×3): 7 [IU] via SUBCUTANEOUS
  Administered 2022-10-17 – 2022-10-18 (×6): 4 [IU] via SUBCUTANEOUS
  Administered 2022-10-18: 7 [IU] via SUBCUTANEOUS
  Administered 2022-10-19: 3 [IU] via SUBCUTANEOUS
  Administered 2022-10-19 – 2022-10-21 (×15): 4 [IU] via SUBCUTANEOUS
  Administered 2022-10-21: 3 [IU] via SUBCUTANEOUS
  Administered 2022-10-21 – 2022-10-22 (×3): 4 [IU] via SUBCUTANEOUS
  Administered 2022-10-22 (×2): 3 [IU] via SUBCUTANEOUS
  Administered 2022-10-22 (×2): 4 [IU] via SUBCUTANEOUS
  Administered 2022-10-23: 3 [IU] via SUBCUTANEOUS
  Administered 2022-10-23: 4 [IU] via SUBCUTANEOUS
  Administered 2022-10-23: 7 [IU] via SUBCUTANEOUS
  Administered 2022-10-23 (×3): 4 [IU] via SUBCUTANEOUS
  Administered 2022-10-24: 3 [IU] via SUBCUTANEOUS
  Administered 2022-10-24: 4 [IU] via SUBCUTANEOUS
  Administered 2022-10-24: 7 [IU] via SUBCUTANEOUS
  Administered 2022-10-25: 4 [IU] via SUBCUTANEOUS
  Administered 2022-10-25: 3 [IU] via SUBCUTANEOUS
  Administered 2022-10-25: 4 [IU] via SUBCUTANEOUS
  Administered 2022-10-25: 7 [IU] via SUBCUTANEOUS
  Administered 2022-10-25 – 2022-10-26 (×2): 3 [IU] via SUBCUTANEOUS
  Administered 2022-10-26: 11 [IU] via SUBCUTANEOUS
  Administered 2022-10-27 (×2): 4 [IU] via SUBCUTANEOUS
  Administered 2022-10-27 (×2): 7 [IU] via SUBCUTANEOUS
  Administered 2022-10-28 (×2): 4 [IU] via SUBCUTANEOUS
  Administered 2022-10-28: 7 [IU] via SUBCUTANEOUS
  Administered 2022-10-29: 3 [IU] via SUBCUTANEOUS
  Administered 2022-10-29: 4 [IU] via SUBCUTANEOUS
  Administered 2022-10-30: 7 [IU] via SUBCUTANEOUS
  Administered 2022-10-30: 4 [IU] via SUBCUTANEOUS

## 2022-10-16 NOTE — Progress Notes (Signed)
[facility]   Reason for visit: Follow up on Staph epidermidis bacteremia  Interval History: no acute events.  Patient does not respond  Day 8 total antibiotics  Physical Exam: Constitutional:  Vitals:   10/16/22 1100 10/16/22 1200  BP: 109/70 114/82  Pulse: 77 75  Resp: (!) 27 (!) 27  Temp:  98.9 F (37.2 C)  SpO2: 98% 92%   patient appears in NAD Respiratory: Normal respiratory effort  Review of Systems: Unable to be assessed due to mental status  Lab Results  Component Value Date   WBC 7.9 10/16/2022   HGB 14.5 10/16/2022   HCT 42.9 10/16/2022   MCV 86.7 10/16/2022   PLT 195 10/16/2022    Lab Results  Component Value Date   CREATININE 0.85 10/14/2022   BUN 21 10/14/2022   NA 129 (L) 10/14/2022   K 4.3 10/14/2022   CL 98 10/14/2022   CO2 21 (L) 10/14/2022    Lab Results  Component Value Date   ALT 24 10/09/2022   AST 36 10/09/2022   ALKPHOS 82 10/09/2022     Microbiology: Recent Results (from the past 240 hour(s))  Culture, blood (Routine X 2) w Reflex to ID Panel     Status: Abnormal   Collection Time: 10/09/22  1:10 PM   Specimen: BLOOD RIGHT ARM  Result Value Ref Range Status   Specimen Description BLOOD RIGHT ARM  Final   Special Requests   Final    BOTTLES DRAWN AEROBIC AND ANAEROBIC Blood Culture results may not be optimal due to an excessive volume of blood received in culture bottles   Culture  Setup Time   Final    GRAM POSITIVE COCCI BOTTLES DRAWN AEROBIC ONLY PREVIOUSLY REPORTED AS: GRAM NEGATIVE RODS CORRECTED RESULTS CALLED TO: PHARMD J. [facility] [id number] @ 1921 FH Performed at [facility], [address], [place], [place] [address]    Culture STAPHYLOCOCCUS EPIDERMIDIS (A)  Final   Report Status 10/13/2022 FINAL  Final   Organism ID, Bacteria STAPHYLOCOCCUS EPIDERMIDIS  Final      Susceptibility   Staphylococcus epidermidis - MIC*    CIPROFLOXACIN <=0.5 SENSITIVE Sensitive     ERYTHROMYCIN <=0.25  SENSITIVE Sensitive     GENTAMICIN <=0.5 SENSITIVE Sensitive     OXACILLIN <=0.25 SENSITIVE Sensitive     TETRACYCLINE <=1 SENSITIVE Sensitive     VANCOMYCIN 2 SENSITIVE Sensitive     TRIMETH/SULFA <=10 SENSITIVE Sensitive     CLINDAMYCIN <=0.25 SENSITIVE Sensitive     RIFAMPIN <=0.5 SENSITIVE Sensitive     Inducible Clindamycin NEGATIVE Sensitive     * STAPHYLOCOCCUS EPIDERMIDIS  Culture, blood (Routine X 2) w Reflex to ID Panel     Status: Abnormal   Collection Time: 10/09/22  1:10 PM   Specimen: BLOOD LEFT ARM  Result Value Ref Range Status   Specimen Description BLOOD LEFT ARM  Final   Special Requests   Final    BOTTLES DRAWN AEROBIC AND ANAEROBIC Blood Culture results may not be optimal due to an excessive volume of blood received in culture bottles   Culture  Setup Time   Final    GRAM POSITIVE COCCI ANAEROBIC BOTTLE ONLY CRITICAL RESULT CALLED TO, READ BACK BY AND VERIFIED WITH: PHARMD ELIZABETH M 1052 0626 FCP Performed at [facility], [address], [place], [place] [address]    Culture STAPHYLOCOCCUS EPIDERMIDIS (A)  Final   Report Status 10/13/2022 FINAL  Final  Organism ID, Bacteria STAPHYLOCOCCUS EPIDERMIDIS  Final      Susceptibility   Staphylococcus epidermidis - MIC*    CIPROFLOXACIN <=0.5 SENSITIVE Sensitive     ERYTHROMYCIN <=0.25 SENSITIVE Sensitive     GENTAMICIN <=0.5 SENSITIVE Sensitive     OXACILLIN <=0.25 SENSITIVE Sensitive     TETRACYCLINE 2 SENSITIVE Sensitive     VANCOMYCIN 2 SENSITIVE Sensitive     TRIMETH/SULFA <=10 SENSITIVE Sensitive     CLINDAMYCIN <=0.25 SENSITIVE Sensitive     RIFAMPIN <=0.5 SENSITIVE Sensitive     Inducible Clindamycin NEGATIVE Sensitive     * STAPHYLOCOCCUS EPIDERMIDIS  Blood Culture ID Panel (Reflexed)     Status: Abnormal   Collection Time: 10/09/22  1:10 PM  Result Value Ref Range Status   Enterococcus faecalis NOT DETECTED NOT DETECTED Final   Enterococcus Faecium NOT DETECTED NOT DETECTED Final   Listeria  monocytogenes NOT DETECTED NOT DETECTED Final   Staphylococcus species DETECTED (A) NOT DETECTED Final    Comment: CRITICAL RESULT CALLED TO, READ BACK BY AND VERIFIED WITH: PHARMD ELIZABETH M 1052 0626 FCP    Staphylococcus aureus (BCID) NOT DETECTED NOT DETECTED Final   Staphylococcus epidermidis DETECTED (A) NOT DETECTED Final    Comment: CRITICAL RESULT CALLED TO, READ BACK BY AND VERIFIED WITH: PHARMD ELIZABETH M 1052 0626 FCP    Staphylococcus lugdunensis NOT DETECTED NOT DETECTED Final   Streptococcus species NOT DETECTED NOT DETECTED Final   Streptococcus agalactiae NOT DETECTED NOT DETECTED Final   Streptococcus pneumoniae NOT DETECTED NOT DETECTED Final   Streptococcus pyogenes NOT DETECTED NOT DETECTED Final   A.calcoaceticus-baumannii NOT DETECTED NOT DETECTED Final   Bacteroides fragilis NOT DETECTED NOT DETECTED Final   Enterobacterales NOT DETECTED NOT DETECTED Final   Enterobacter cloacae complex NOT DETECTED NOT DETECTED Final   Escherichia coli NOT DETECTED NOT DETECTED Final   Klebsiella aerogenes NOT DETECTED NOT DETECTED Final   Klebsiella oxytoca NOT DETECTED NOT DETECTED Final   Klebsiella pneumoniae NOT DETECTED NOT DETECTED Final   Proteus species NOT DETECTED NOT DETECTED Final   Salmonella species NOT DETECTED NOT DETECTED Final   Serratia marcescens NOT DETECTED NOT DETECTED Final   Haemophilus influenzae NOT DETECTED NOT DETECTED Final   Neisseria meningitidis NOT DETECTED NOT DETECTED Final   Pseudomonas aeruginosa NOT DETECTED NOT DETECTED Final   Stenotrophomonas maltophilia NOT DETECTED NOT DETECTED Final   Candida albicans NOT DETECTED NOT DETECTED Final   Candida auris NOT DETECTED NOT DETECTED Final   Candida glabrata NOT DETECTED NOT DETECTED Final   Candida krusei NOT DETECTED NOT DETECTED Final   Candida parapsilosis NOT DETECTED NOT DETECTED Final   Candida tropicalis NOT DETECTED NOT DETECTED Final   Cryptococcus neoformans/gattii NOT  DETECTED NOT DETECTED Final   Methicillin resistance mecA/C NOT DETECTED NOT DETECTED Final    Comment: Performed at [facility], [address], [place], [place] [address]  Culture, blood (Routine X 2) w Reflex to ID Panel     Status: None (Preliminary result)   Collection Time: 10/11/22 11:50 AM   Specimen: BLOOD RIGHT HAND  Result Value Ref Range Status   Specimen Description BLOOD RIGHT HAND  Final   Special Requests   Final    BOTTLES DRAWN AEROBIC AND ANAEROBIC Blood Culture adequate volume   Culture   Final    NO GROWTH 4 DAYS Performed at [facility], [address] [place], [place] [address]    Report Status PENDING  Incomplete  Culture, blood (Routine X 2) w Reflex to ID Panel     Status: None (Preliminary result)   Collection Time: 10/11/22 11:51 AM   Specimen: BLOOD RIGHT HAND  Result Value Ref Range Status   Specimen Description BLOOD RIGHT HAND  Final   Special Requests   Final    BOTTLES DRAWN AEROBIC AND ANAEROBIC Blood Culture adequate volume   Culture   Final    NO GROWTH 4 DAYS Performed at [facility], [address], [place], [place] [address]    Report Status PENDING  Incomplete    Impression/Plan:  1. Bacteremia with Staph epidermidis in one bottle each of two sets - positive in the setting of ICD.  Repeat blood cultures have remained negative.  No new fever, no leukocytosis.   Will continue with cefazolin for now.    2.  ICD in place - TEE when able to assess in light of bacteremia associated with fever.    I have personally spent 52 minutes involved in face-to-face and non-face-to-face activities for this patient on the day of the visit. Professional time spent includes the following activities: Preparing to see the patient (review of tests), Obtaining and/or reviewing separately obtained history (admission/discharge record), Performing a medically appropriate examination and/or evaluation , Ordering medications/tests/procedures,  referring and communicating with other health care professionals, Documenting clinical information in the EMR, Independently interpreting results (not separately reported), Communicating results to the patient/family/caregiver, Counseling and educating the patient/family/caregiver and Care coordination (not separately reported).

## 2022-10-16 NOTE — Progress Notes (Signed)
Occupational Therapy Treatment Patient Details Name: [name] MRN: [id number] DOB: [birth year] Today's Date: 10/16/2022   History of present illness Patient is a 62 y/o male admitted 10/03/22 with L weakness and numbness.  Received TNK then developed now symptoms of aphasia and R weakness.  He underwent mechanical thrombectomy L M3 segment MCA noted to have small hemorrhage and follow up CT showed progression of hemorrhage 3cm then 7cm and small acute R cerebellar infarct. PMH positive for CAD s/p PCI w/stent and 1v CABG, multiple CGAs, systolic HF s/p ICD, HLD, HTN.   OT comments  Splint received and fitted to the patient.  He is showing some improved AROM tot he RUE.  Patient was able activate bicep and pick his arm up to assist OT with placing the resting hand splint.  Patient continues with receptive and expressive deficits, either saying yes or grunting to all questions.  OT will continue efforts in the acute setting, and Patient will benefit from continued inpatient follow up therapy, <3 hours/day      Recommendations for follow up therapy are one component of a multi-disciplinary discharge planning process, led by the attending physician.  Recommendations may be updated based on patient status, additional functional criteria and insurance authorization.    Assistance Recommended at Discharge Frequent or constant Supervision/Assistance  Patient can return home with the following  Two people to help with walking and/or transfers;Two people to help with bathing/dressing/bathroom;Assistance with cooking/housework;Assistance with feeding;Help with stairs or ramp for entrance;Direct supervision/assist for financial management;Assist for transportation;Direct supervision/assist for medications management   Equipment Recommendations       Recommendations for Other Services      Precautions / Restrictions Precautions Precautions: Fall Precaution Comments: R inattention; SBP  <160 Restrictions Weight Bearing Restrictions: No                Extremity/Trunk Assessment Upper Extremity Assessment RUE Deficits / Details: noted bicep activation and ability to pick the arm up to help OT fit splint. RUE Sensation: decreased light touch RUE Coordination: decreased fine motor;decreased gross motor   Lower Extremity Assessment Lower Extremity Assessment: Defer to PT evaluation                          Cognition Arousal/Alertness: Awake/alert Behavior During Therapy: Flat affect Overall Cognitive Status: Difficult to assess                                                             Pertinent Vitals/ Pain       Pain Assessment Pain Assessment: Faces Faces Pain Scale: No hurt Pain Intervention(s): Monitored during session                                                          Frequency  Min 2X/week        Progress Toward Goals  OT Goals(current goals can now be found in the care plan section)  Progress towards OT goals: Progressing toward goals  Acute Rehab OT Goals OT Goal Formulation: Patient unable to participate in goal setting Time For Goal  Achievement: 10/19/22 Potential to Achieve Goals: Fair ADL Goals Additional ADL Goal #1: Patient will tolerate R resting hand splint 4 hours on and 4 off with no skin integrity issues.  Plan Discharge plan remains appropriate    Co-evaluation                 AM-PAC OT "6 Clicks" Daily Activity     Outcome Measure   Help from another person eating meals?: Total Help from another person taking care of personal grooming?: A Lot Help from another person toileting, which includes using toliet, bedpan, or urinal?: Total Help from another person bathing (including washing, rinsing, drying)?: Total Help from another person to put on and taking off regular upper body clothing?: Total Help from another person to put on and taking off  regular lower body clothing?: Total 6 Click Score: 7    End of Session    OT Visit Diagnosis: Unsteadiness on feet (R26.81);Other abnormalities of gait and mobility (R26.89);Muscle weakness (generalized) (M62.81);Cognitive communication deficit (R41.841);Hemiplegia and hemiparesis;Low vision, both eyes (H54.2);Apraxia (R48.2);Other symptoms and signs involving cognitive function Symptoms and signs involving cognitive functions: Cerebral infarction Hemiplegia - Right/Left: Right Hemiplegia - dominant/non-dominant: Dominant Hemiplegia - caused by: Cerebral infarction   Activity Tolerance Patient tolerated treatment well   Patient Left in bed;with call bell/phone within reach;with bed alarm set   Nurse Communication Other (comment) (splint placed)        Time: 1610-9604 OT Time Calculation (min): 19 min  Charges: OT General Charges $OT Visit: 1 Visit OT Treatments $Orthotics Fit/Training: 8-22 mins  10/16/2022  RP, OTR/L  Acute Rehabilitation Services  Office:  [phone]   Suzanna Obey 10/16/2022, 3:06 PM

## 2022-10-16 NOTE — Consult Note (Signed)
Palliative Care Consult Note                                  Date: 10/16/2022   Patient Name: [name]  DOB: 10/22/1960  MRN: [id number]  Age / Sex: 62 y.o., male  PCP: [name], MD Referring Physician: Stroke, Md, MD  Reason for Consultation: Establishing goals of care  HPI/Patient Profile: 62 y.o. male  with past medical history of tobacco abuse, CAD s/p CABG, HFrEF/ HFpEF, ICM w/ICD (MRI compatible), myopericarditis/ dresslers syndrome (2014), HTN, HLD, and multiple CVAs  admitted on 10/02/2022 with left hemiplegia, dysarthria, dizziness, and facial droop.   Initial CTH negative, treated w/TNK but subsequently later developed right sided deficits found to have left M3 occlusion s/p NIR on 6/19 w/ successful thrombectomy, however complicated by IPH (left frontoparietal) and SAH. Developed fever 6/19 without leukocytosis with initial negative CXR, UA, and PCT. Ceftriaxone empirically started 6/25. Found to have large inferior, thrombus filled, pseudoaneurysm with EF 20-25% (prior 25-30% 2022) on TTE with concern for dehiscence of prior pseudoaneurysm patch in 2014 and source of strokes to be embolic.   The patient is having some improvement but still faces ongoing treatment option decisions, advance directive decisions and anticipatory care needs.  Past Medical History:  Diagnosis Date   Abnormal EKG    hx of ischemia showing up on ekg's   Acute myopericarditis    a. 03/2013: readm with hypoxia, tachycardia, elevated ESR/CRP, elevated troponin with Dressler syndrome and myopericarditis; treated with steroids.   Acute respiratory failure (HCC)    a. 01/2013: VDRF. b. 03/2013: hypoxia requiring supp O2 during adm, resolved by discharge.   C. difficile colitis    a. 01/2013 during prolonged adm. b. Recurred 03/2013.   Cardiac tamponade    a. 01/2013 s/p drain.   Cataracts, bilateral    Coronary artery disease    a. s/p MI  in 2009 in [place] with stenting of the LCX and LAD;  b. 12/2012 NSTEMI/CAD: LM nl, LAD patent prox stent, LCX 50-70 isr (FFR 0.84), RCA dom, 143m, EF 40-45%-->Med Rx. c. 01/2013: anterolateral STEMI complicated by pericardial effusion (presumed purulent pericarditis) and tamponade s/p drain, ruptured LV pseudoaneurysm s/p CorMatrix patch, CABGx1 (SVG-OM1), fever, CVA, VDRF, C diff.   CVA (cerebral infarction)    a. 01/2013 in setting of prolonged hospitalization - residual L arm weakness.   Dressler syndrome (HCC)    a. 03/2013: readm with hypoxia, tachycardia, elevated ESR/CRP, elevated troponin with Dressler syndrome and myopericarditis; treated with steroids.   Glaucoma    Hyperlipidemia    Hypokalemia    Hyponatremia    a. During late 2014.   Ischemic cardiomyopathy    a. Sept/Oct 2014: EF ~40% (ICM). b. 03/2013: EF 25-30%. Off ACEI due to hypotension (MIXED NICM/ICM).   Optic neuropathy, ischemic    Pericardial effusion    a. 01/2013: pericardial effusion (presumed purulent pericarditis) and cardiac tamponade s/p drain. b. Persistent moderate pericardial effusion 03/2013.   Pre-diabetes    Pseudoaneurysm of left ventricle of heart    a. 01/2013:  Ruptured inferoposterior LV pseudoaneurysm s/p CorMatrix patch.   Sinus bradycardia    asymptomatic   Stroke [facility]) 2009   weak lt side-lt arm   Tobacco abuse    Wears dentures    top    Subjective:   I have reviewed medical records including EPIC notes, labs and imaging, received  an update from his nurse then called the patient's son Diland to set up a time to discuss diagnosis prognosis, GOC, EOL wishes, disposition and options.  I introduced Palliative Medicine as specialized medical care for people living with serious illness. It focuses on providing relief from symptoms and stress of a serious illness. The goal is to improve quality of life for both the patient and the family.  Today's Discussion: The patient son Jevante says that he  and the patient's fiance Rich Reining work together to make decisions about the patient's healthcare when he is unable to. They are not available to meet until Friday 7/5 in the morning. The patient and family face ongoing treatment option decisions, advance directive decisions and anticipatory care needs.    Discussed the importance of continued conversation with family and the medical providers regarding overall plan of care and treatment options, ensuring decisions are within the context of the patient's values and GOCs.  Questions and concerns were addressed. The family was encouraged to call with questions or concerns. PMT will continue to support holistically.  Review of Systems  Unable to perform ROS   Objective:   Primary Diagnoses: Present on Admission:  Stroke (cerebrum) [facility])   Physical Exam  Vital Signs:  BP 101/85   Pulse 77   Temp 98.9 F (37.2 C) (Oral)   Resp 12   Ht 6\' 1"  (1.854 m)   Wt 101.2 kg   SpO2 95%   BMI 29.44 kg/m   Palliative Assessment/Data: 30%    Advanced Care Planning:   Existing Vynca/ACP Documentation: None.  Primary Decision Maker: When the patient is unable to make decisions the patient's son Kailee makes decisions with the patient's fiance [name].  Code Status/Advance Care Planning: Full code    Assessment & Plan:   SUMMARY OF RECOMMENDATIONS   Full code Meeting planned to discuss GOC 7/5 at 0930 Continued PMT support  Discussed with: bedside RN and Dr. Pearlean Brownie    Thank you for allowing [place] to participate in the care of ASHLYN PAPP PMT will continue to support holistically.  Time Total: 60 minutes    Signed by: Sarina Ser, NP Palliative Medicine Team  Team Phone # [phone] (Nights/Weekends)  10/16/2022, 2:29 PM

## 2022-10-16 NOTE — Progress Notes (Signed)
Speech Language Pathology Treatment: Dysphagia  Patient Details Name: [name] MRN: [id number] DOB: 07/10/1960 Today's Date: 10/16/2022 Time: 4098-1191 SLP Time Calculation (min) (ACUTE ONLY): 12 min  Assessment / Plan / Recommendation Clinical Impression  Pt alert, eager to participate, smiling at times. Still profoundly impaired from a language standpoint; unable to repeat phonemes with max visual model. Response yes or with stereotypical grunt to all questions or commands. Function similar to initial presentation, though also quite dysphonic. Respirations stable. Pt eager to attempt teaspoons of honey thick liquid and puree. Late throat clearing and coughing observed. Would like to repeat instrumental assessment prior to diet initiation given respiratory decline over the past week. Will plan on MBS tomorrow    HPI HPI: Pt is a 62 y/o male who presented 6/19 with L weakness and numbness. Pt received TNK then developed new symptoms of aphasia and R weakness. He underwent mechanical thrombectomy L M3 segment MCA 6/19. Pt noted to have small hemorrhage and follow up CT showed progression of hemorrhage 3cm then 7cm and small acute R cerebellar infarct. PMH: CAD s/p PCI w/stent and 1v CABG, multiple CGAs, systolic HF s/p ICD, HLD, HTN.      SLP Plan  MBS      Recommendations for follow up therapy are one component of a multi-disciplinary discharge planning process, led by the attending physician.  Recommendations may be updated based on patient status, additional functional criteria and insurance authorization.    Recommendations  Diet recommendations: NPO                  Oral care BID   Frequent or constant Supervision/Assistance Dysarthria and anarthria (R47.1);Cognitive communication deficit (R41.841);Aphasia (R47.01)     MBS     Stina Gane, [name]  10/16/2022, 10:00 AM

## 2022-10-16 NOTE — Progress Notes (Signed)
NAME:  [name], MRN:  [id number], DOB:  1960-10-25, LOS: 14 ADMISSION DATE:  10/02/2022, CONSULTATION DATE:  10/14/2022 REFERRING MD:  Dr. Pearlean Brownie, CHIEF COMPLAINT:  Fever   History of Present Illness:  62 year old male with PMH significant for tobacco abuse, CAD s/p CABG, HFrEF/ HFpEF, ICM w/ICD (MRI compatible), myopericarditis/ dresslers syndrome (2014), HTN, HLD, and multiple CVAs initially presenting 6/18 with left hemiplegia, dysarthria, dizziness, and facial droop. Initial CTH negative, treated w/TNK but subsequently later developed right sided deficits found to have left M3 occlusion s/p NIR on 6/19 w/ successful thrombectomy, however complicated by IPH (left frontoparietal) and SAH. Developed fever 6/19 without leukocytosis with initial negative CXR, UA, and PCT. Ceftriaxone empirically started 6/25. Found to have large inferior, thrombus filled, pseudoaneurysm with EF 20-25% (prior 25-30% 2022) on TTE with concern for dehiscence of prior pseudoaneurysm patch in 2014 and source of strokes to be embolic. Cardiology consulted 6/23, anticoagulation recommended but deferred given recent IPH and SAH. On 6/26, blood cultures positive for MSSE. ID consulted 6/27, ceftriaxone switched to cefazolin. Patient continues to have fevers despite abx with increasing leukocytosis on 6/28. EP consulted for possible ICD extraction given bacteremia but not a candidate at this time. Heparin gtt started 6/28. Repeat blood cultures 6/27 remain NGTD x 3 days. CTH remains stable 6/29 without neuro changes. Given ongoing fevers, ID requesting TEE in which cardiology would like him intubated for, therefore PCCM consulted.   Pertinent  Medical History  Tobacco abuse, CAD s/p CABG, HFrEF, ICM w/ICD (MRI compatible), pseudoaneurysm inferior wall s/p repair 2014, myopericarditis/ dresslers syndrome (2014), HTN, HLD, CVA, cdiff   Significant Hospital Events: Including procedures, antibiotic start and stop dates in  addition to other pertinent events     Interim History / Subjective:  No acute events overnight.  Much more awake and interactive today.  No fever x 24 hours.  O2 weaned to room air and tolerating well.   Objective   Blood pressure 110/73, pulse 76, temperature 98.6 F (37 C), temperature source Axillary, resp. rate 17, height 6\' 1"  (1.854 m), weight 101.2 kg, SpO2 94 %.        Intake/Output Summary (Last 24 hours) at 10/16/2022 0835 Last data filed at 10/16/2022 0600 Gross per 24 hour  Intake 2272.69 ml  Output 2875 ml  Net -602.31 ml    Filed Weights   10/12/22 0700 10/15/22 0400 10/16/22 0425  Weight: 107.7 kg 100.9 kg 101.2 kg    Examination: General:  Middle aged male resting comfortably in bed Neuro:  Awake alert. Moving all 4 extremities. Follows commands on the left.  HEENT:  [place]/AT, No JVD noted, PERRL Cardiovascular:  RRR, no MRG Lungs:  Diminished throughout.  Abdomen:  Soft, non-distended, hyperactive Musculoskeletal:  No acute deformity or edema Skin:  Intact, MMM   Resolved Hospital Problem list   N/A  Assessment & Plan:  LV pseudoaneurysm with 1.5cm thrombus  Embolic CVA/ IPH/ SAH  MSSE bacteremia Persistent fever  Pt had BC with MSSE bacteremia and has had ongoing fevers with a point of worsening leukocytosis 6/28-6/30. Repeat BC  drawn from 6/27 show NGTD and WBC normalized. Tmax appears to be 100.67f overnight- however this could be reactive due to brain bleed amongst other ideations vs MSSE bactermia. ID was consulted and recommended pt to have ICD removed however pt not a candidate for this currently. Cardiology was also consulted and request pt to have TEE and they would like him intubated for this.Marland Kitchen  previously noted that pt may not tolerate TEE and may end up with difficulty removing ETT post procedure.  -Fever improved -6/24  two of two blood Cx (+) MSSE->pt on cefazoline, follow repeat Cx to ensure clearance (NGTD) -Per cards/CVTS- pt not a  candidate for redo cardiac surgery for LV pseudoaneurysm with thrombus -EP noted pt not currently candidate for lead extraction  HFrEF acute on chronic -Goal-directed medical therapy for his heart failure per cardiology team - Check chemistry to assess response to ongoing diuresis - Will de-escalate IV lasix/albumin to daily lasix enterally.   Acute Stroke - medical therapy for his CVA and intra cranial bleed per stroke service  Acute hypoxemic respiratory failure combination of fluid overload and poor pulmonary toileting. Patient now tolerating room air.  -Continue hypertonic nebs, CPT -Continue to monitor and wean o2 as able for goal SPO2 >92%  HTN HLD CVA -Goal-directed medical therapy for his heart failure per cardiology team -Continue home medications- statin, carvedilol, lasix, metoprolol, entresto, and spironolactone    Nutrition -continue TF with Osmolite at 16ml/hr and PROsource 60ml daily.  -Appreciate SLP recs   No plans for intubation/TEE PCCM will sign off  Best Practice (right click and "Reselect all SmartList Selections" daily)   Diet/type: tubefeeds DVT prophylaxis: systemic heparin GI prophylaxis: PPI Lines: N/A Foley:  N/A Code Status:  full code Last date of multidisciplinary goals of care discussion [Per Primary]   Critical care time:        Joneen Roach, AGACNP-BC [place]  See Amion for personal pager PCCM on call pager [phone] until 7pm. Please call Elink 7p-7a. [phone]  10/16/2022 11:31 AM

## 2022-10-16 NOTE — Progress Notes (Addendum)
STROKE TEAM PROGRESS NOTE   INTERVAL HISTORY Patient remains hemodynamically stable with Tmax of 99.4.Marland Kitchen  WBC count is normal his neurological exam is improved, and he is able to follow commands with left upper and bilateral lower extremities.  He is stable to transfer out of the ICU today.  Vitals:   10/16/22 1000 10/16/22 1100 10/16/22 1200 10/16/22 1300  BP: 109/76 109/70 114/82 101/85  Pulse: 81 77 75 77  Resp: (!) 21 (!) 27 (!) 27 12  Temp:   98.9 F (37.2 C)   TempSrc:   Oral   SpO2: 98% 98% 92% 95%  Weight:      Height:       CBC:  Recent Labs  Lab 10/15/22 0421 10/16/22 0453  WBC 9.2 7.9  HGB 14.2 14.5  HCT 41.7 42.9  MCV 85.5 86.7  PLT 171 195    Basic Metabolic Panel:  Recent Labs  Lab 10/13/22 0220 10/14/22 0443 10/14/22 0722  NA 131* 129* 129*  K 3.9 4.2 4.3  CL 99  --  98  CO2 21*  --  21*  GLUCOSE 126*  --  230*  BUN 20  --  21  CREATININE 0.85  --  0.85  CALCIUM 8.3*  --  8.5*     IMAGING past 24 hours No results found.   Physical Exam  Constitutional: Appears well-developed and well-nourished.  Middle-aged African-American male Cardiovascular: Normal rate and regular rhythm.  Respiratory: Regular, unlabored respirations on supplemental O2 Skin: WDI  Neuro: Mental Status: Patient is awake, alert, speech is dysarthric with expressive aphasia. .Following commands briskly on the left side Cranial Nerves: II: Pupils are equal, round, and reactive to light.   III,IV, VI: EOMI without ptosis or diplopia.  VII: Mild right facial droop  VIII: Hearing is intact to voice XII: tongue is midline without atrophy or fasciculations.  Motor: Tone is normal. Bulk is normal.  Moves left upper and lower extremities to command, will does not move right upper extremity but moves right lower extremity to command     ASSESSMENT/PLAN [name] is a 62 y.o. male with history of CAD s/p PCI w/ stent to LCS and LAD, s/p 1v CABG, multiple CVAs (R  cerebellar 04/2022),  systolic HF s/p ICD, HLD, HTN presenting with slurred speech and L sided weakness now s/p TNK and IR thrombectomy of L MCA (M3 segment) c/b  hemorrhage.   TIA - Right brain stroke aborted s/p TNK and later that day second stroke with left M2 occlusion s/p IR Stroke - Left MCA and right cerebellar infarcts left M3 occlusion s/p IR with TICIs, complicated by L frontoparietal ICH and SAH, etiology likely cardioembolic source  CT 6/18 no acute finding, but the bilateral frontal, parietal, occipital and cerebellum infarcts CTA head and neck unremarkable CTA head and neck repeat new short segment occlusion of distal left M2 S/p IR with left M3 TICIs reperfusion  post IR CT Hyperdensity within the left sylvian fissure may represent small subarachnoid hemorrhage versus contrast. CT Head: Progressed hemorrhage in L frontoparietal region w/ 3cm hematoma and regional SAH MRI  progressive L fronto parietal hematoma (~7cm), regional SAH, increased mass effect.  Likely underlying infarct. Sm R cerebellar infarct, acute. Numerous chornic cerebral and cerebellar infarcts Repeat CT 6/19 increase in size of the left frontal/parietal hematoma. New extension into the left lateral ventricle. CT repeat 6/22 x 2 - New component of intraparenchymal hemorrhage in the posterior right hemisphere that measures approximately  2.5 x 1.4 cm. (On my review, more like right brain redistribution instead of new hemorrhage)  Repeat CT 6/23: Unchanged appearance of bilateral mixed intraparenchymal and subarachnoid hemorrhage. Trace rightward midline shift is unchanged. No hydrocephalus CT repeat 6/25 stable hemorrhage, unchanged CT Head 6/28-  stable hematoma. SAH and IVH have regressed since 10/03/2022.  Echo: Large inferior pseudoaneurysm with a depth of 3 cm and a maximum width of roughly 5 cm the wall of the pseudoaneurysm is made above the layer of thrombus that is roughly 1.5 cm thick. EF 30 to 35%, severe  hypokinesis of left ventricle. No afib with ICD interrogation LE venous doppler neg for DVT TCD bubble study showed moderate sized PFO LDL 63 HgbA1c 5.8 UDS neg VTE prophylaxis - SQH  aspirin 81 mg daily and clopidogrel 75 mg daily prior to admission, now on IV Heparin per stroke protocol Therapy recommendations:  SNF Disposition:  pending  CHF s/p ICD Systolic HF, s/p ICD 2/2 ICM.  Echo EF 30-35%  Trop 45 -> 43 -> 39 -> 35 CK 3714, CKMB 3.5 CXR 6/25 central vascular congestion and developing interstitial edema.  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  Cardiology on board, appreciate assistance  CTA of the heart intact chronic patch repair to inferior and inferior septal pseudoaneurysm, large burden of thrombus on top of patch repair, severe biventricular failure with D-shaped septum, occluded SVG to OM Heparin IV started 6/28 Ultimately patient will need to repeat undergo repeat pseudoaneurysm repair if he becomes healthy enough to tolerate surgery  Hyperlipidemia Home meds:  atorvastatin 80 LDL 63, goal < 70 Lipitor 80 resumed    Continue statin at discharge  Tobacco abuse Current smoker Smoking cessation counseling will be provided Nicotine patch provided  Fever ? Aspiration pneumonia Tmax 101.6 -> 99.8-> afebrile->100.8 -> 102-> 101.7-> 101.1 -> 101-> 100.3->  99.4 WBC 7.6->9.4->10.2->9.8->10.2-> 9.2 UA negative CXR central vascular congestion and developing interstitial edema.  CXR 6/26 similar appearance of atelectasis at both lung bases Procalc negative.  Blood culture 2/4 bottles staph epidermidis, ID consulted, appreciate recommendations Blood culture repeat pending -no growth x 3 days NT suction PRN Ceftriaxone -> ancef ID consulted EP on board - not candidate for ICD extraction at this time Cardiology felt pt not candidate for TEE now given respiratory distress, however they are willing to do it if he is intubated  Dysphagia  Vomiting on zofran PRN, improved Pt ate well 6/24, nocturnal TF d/c'ed -> fever with possible aspiration -> NPO and TF restarted on 6/25 6/27 having difficulty coordinating swallowing and respirations  SLP will need to re evaluate  PFO Known PFO in the past TCD bubble study showed a moderate-sized PFO LE venous Doppler negative for DVT  Not an emergent issue at this time  Other stroke risk factors Obesity with BMI 32.23 CAD  Other Active Problems Thrombocytopenia, platelet 110->89-> 172-> 190-> stable CK 436 -> 3714 -> 610->  490  Hospital day # 14  Patient seen and examined by NP/APP with MD. MD to update note as needed.   Cortney E Ernestina Columbia , MSN, AGACNP-BC [facility] See Amion for schedule and pager information 10/16/2022 2:42 PM  I have personally obtained history,examined this patient, reviewed notes, independently viewed imaging studies, participated in medical decision making and plan of care.ROS completed by me personally and pertinent positives fully documented  I have made any additions or clarifications directly to the above note. Agree with note above.  Patient's fever curve has finally responded to his antibiotics and white count is also normal.  His neurological exam shows improvement.  Continue antibiotics as per ID team.  Continue IV heparin for LV pseudoaneurysm and clot.   Transfer out of ICU.  Consult medical hospitalist team to resume care when he goes to the floor.  No family available at the bedside for discussion.  Discussed with Dr. Denese Killings critical care medicine.This patient is critically ill and at significant risk of neurological worsening, death and care requires constant monitoring of vital signs, hemodynamics,respiratory and cardiac monitoring, extensive review of multiple databases, frequent neurological assessment, discussion with family, other specialists and medical decision making of high complexity.I have made any additions or clarifications directly to the above note.This critical care time does not reflect procedure time, or teaching time or supervisory time of PA/NP/Med Resident etc but could involve care discussion time.  I spent 30 minutes of neurocritical care time  in the care of  this patient.      Delia Heady, MD Medical Director [facility] Pager: [phone] 10/16/2022 3:15 PM

## 2022-10-16 NOTE — Progress Notes (Signed)
Orthopedic Tech Progress Note Patient Details:  [name] 04/24/1960 [id number]  I've already called in order yesterday for a RESTING HAND SPLINT for the right  Patient ID: [name], male   DOB: Sep 27, 1960, 62 y.o.   MRN: [id number]  [name] 10/16/2022, 4:24 PM

## 2022-10-16 NOTE — Progress Notes (Signed)
ANTICOAGULATION CONSULT NOTE - Follow Up Consult  Pharmacy Consult for heparin Indication: Ventricular pseudoaneurysm  No Known Allergies  Patient Measurements: Height: 6\' 1"  (185.4 cm) Weight: 101.2 kg (223 lb 1.7 oz) IBW/kg (Calculated) : 79.9 Heparin Dosing Weight: 101kg  Vital Signs: Temp: 98.6 F (37 C) (07/02 0300) Temp Source: Axillary (07/02 0300) BP: 128/68 (07/02 0600) Pulse Rate: 68 (07/02 0600)  Labs: Recent Labs    10/14/22 0722 10/15/22 0421 10/16/22 0453  HGB 15.9 14.2 14.5  HCT 45.3 41.7 42.9  PLT 195 171 195  HEPARINUNFRC 0.38 0.34 0.39  CREATININE 0.85  --   --      Estimated Creatinine Clearance: 114.1 mL/min (by C-G formula based on SCr of 0.85 mg/dL).   Medical History: Past Medical History:  Diagnosis Date   Abnormal EKG    hx of ischemia showing up on ekg's   Acute myopericarditis    a. 03/2013: readm with hypoxia, tachycardia, elevated ESR/CRP, elevated troponin with Dressler syndrome and myopericarditis; treated with steroids.   Acute respiratory failure (HCC)    a. 01/2013: VDRF. b. 03/2013: hypoxia requiring supp O2 during adm, resolved by discharge.   C. difficile colitis    a. 01/2013 during prolonged adm. b. Recurred 03/2013.   Cardiac tamponade    a. 01/2013 s/p drain.   Cataracts, bilateral    Coronary artery disease    a. s/p MI in 2009 in [place] with stenting of the LCX and LAD;  b. 12/2012 NSTEMI/CAD: LM nl, LAD patent prox stent, LCX 50-70 isr (FFR 0.84), RCA dom, 122m, EF 40-45%-->Med Rx. c. 01/2013: anterolateral STEMI complicated by pericardial effusion (presumed purulent pericarditis) and tamponade s/p drain, ruptured LV pseudoaneurysm s/p CorMatrix patch, CABGx1 (SVG-OM1), fever, CVA, VDRF, C diff.   CVA (cerebral infarction)    a. 01/2013 in setting of prolonged hospitalization - residual L arm weakness.   Dressler syndrome (HCC)    a. 03/2013: readm with hypoxia, tachycardia, elevated ESR/CRP, elevated troponin with  Dressler syndrome and myopericarditis; treated with steroids.   Glaucoma    Hyperlipidemia    Hypokalemia    Hyponatremia    a. During late 2014.   Ischemic cardiomyopathy    a. Sept/Oct 2014: EF ~40% (ICM). b. 03/2013: EF 25-30%. Off ACEI due to hypotension (MIXED NICM/ICM).   Optic neuropathy, ischemic    Pericardial effusion    a. 01/2013: pericardial effusion (presumed purulent pericarditis) and cardiac tamponade s/p drain. b. Persistent moderate pericardial effusion 03/2013.   Pre-diabetes    Pseudoaneurysm of left ventricle of heart    a. 01/2013:  Ruptured inferoposterior LV pseudoaneurysm s/p CorMatrix patch.   Sinus bradycardia    asymptomatic   [facility]roke [facility]) 2009   weak lt side-lt arm   Tobacco abuse    Wears dentures    top   Assessment: 20 YOM with hx LV pseudoaneurysm s/p patch now with large thrombus burden, admitted with ischemic stroke with hemorrhagic conversion, CT stable today and pharmacy consulted to cautiously start heparin gtt for LV pseudoaneurysm, no bolus with low end goals.    Heparin level 0.39 - on Heparin 1200 units/hr with CBC stable. Per RN no signs/symptoms of bleeding or issues running the drip.  Goal of Therapy:  Heparin level 0.3-0.5 units/ml Monitor platelets by anticoagulation protocol: Yes   Plan:  Continue heparin 1200 units/hr Daily Heparin level and CBC Monitor for signs/symptoms of bleeding  Arabella Merles, PharmD. Clinical Pharmacist 10/16/2022 6:58 AM

## 2022-10-17 ENCOUNTER — Inpatient Hospital Stay (HOSPITAL_COMMUNITY): Payer: Medicare PPO

## 2022-10-17 DIAGNOSIS — T827XXA Infection and inflammatory reaction due to other cardiac and vascular devices, implants and grafts, initial encounter: Secondary | ICD-10-CM | POA: Diagnosis not present

## 2022-10-17 DIAGNOSIS — I6300 Cerebral infarction due to thrombosis of unspecified precerebral artery: Secondary | ICD-10-CM | POA: Diagnosis not present

## 2022-10-17 DIAGNOSIS — R7881 Bacteremia: Secondary | ICD-10-CM | POA: Diagnosis not present

## 2022-10-17 DIAGNOSIS — B957 Other staphylococcus as the cause of diseases classified elsewhere: Secondary | ICD-10-CM | POA: Diagnosis not present

## 2022-10-17 DIAGNOSIS — I639 Cerebral infarction, unspecified: Secondary | ICD-10-CM | POA: Diagnosis not present

## 2022-10-17 LAB — BASIC METABOLIC PANEL
Anion gap: 11 (ref 5–15)
BUN: 31 mg/dL — ABNORMAL HIGH (ref 8–23)
CO2: 24 mmol/L (ref 22–32)
Calcium: 9.3 mg/dL (ref 8.9–10.3)
Chloride: 93 mmol/L — ABNORMAL LOW (ref 98–111)
Creatinine, Ser: 0.88 mg/dL (ref 0.61–1.24)
GFR, Estimated: >60 mL/min (ref >60)
Glucose, Bld: 197 mg/dL — ABNORMAL HIGH (ref 70–99)
Potassium: 4.5 mmol/L (ref 3.5–5.1)
Sodium: 128 mmol/L — ABNORMAL LOW (ref 135–145)

## 2022-10-17 LAB — CBC
HCT: 43.9 % (ref 39.0–52.0)
Hemoglobin: 15.2 g/dL (ref 13.0–17.0)
MCH: 29.5 pg (ref 26.0–34.0)
MCHC: 34.6 g/dL (ref 30.0–36.0)
MCV: 85.2 fL (ref 80.0–100.0)
Platelets: 233 10*3/uL (ref 150–400)
RBC: 5.15 MIL/uL (ref 4.22–5.81)
RDW: 12.7 % (ref 11.5–15.5)
WBC: 8.5 10*3/uL (ref 4.0–10.5)
nRBC: 0 % (ref 0.0–0.2)

## 2022-10-17 LAB — GLUCOSE, CAPILLARY
Glucose-Capillary: 152 mg/dL — ABNORMAL HIGH (ref 70–99)
Glucose-Capillary: 165 mg/dL — ABNORMAL HIGH (ref 70–99)
Glucose-Capillary: 170 mg/dL — ABNORMAL HIGH (ref 70–99)
Glucose-Capillary: 194 mg/dL — ABNORMAL HIGH (ref 70–99)
Glucose-Capillary: 203 mg/dL — ABNORMAL HIGH (ref 70–99)
Glucose-Capillary: 208 mg/dL — ABNORMAL HIGH (ref 70–99)
Glucose-Capillary: 223 mg/dL — ABNORMAL HIGH (ref 70–99)

## 2022-10-17 LAB — HEPARIN LEVEL (UNFRACTIONATED): Heparin Unfractionated: 0.44 IU/mL (ref 0.30–0.70)

## 2022-10-17 NOTE — Progress Notes (Signed)
[facility]                                                                              [name], is a 62 y.o. male, DOB - 09-27-1960, ZOX:[id number] Admit date - 10/02/2022    Outpatient Primary MD for the patient is [name], MD  LOS - 15  days  Chief Complaint  Patient presents with   Code Stroke       Brief summary   Patient is a 62 year old male with tobacco use,CAD s/p CABG, HFrEF/ HFpEF, ICM w/ICD (MRI compatible), myopericarditis/ dresslers syndrome (2014), HTN, HLD, and multiple CVAs initially presented on 6/18 with left hemiplegia, dysarthria, dizziness, and facial droop. Initial CTH negative, treated w/TNK but subsequently later developed right sided deficits found to have left M3 occlusion s/p NIR on 6/19 w/ successful thrombectomy, however complicated by IPH (left frontoparietal) and SAH.  Developed fever 6/19 without leukocytosis with initial negative CXR, UA, and PCT. Ceftriaxone empirically started 6/25. Found to have large inferior, thrombus filled, pseudoaneurysm with EF 20-25% (prior 25-30% 2022) on TTE with concern for dehiscence of prior pseudoaneurysm patch in 2014 and source of strokes to be embolic. Cardiology consulted 6/23, anticoagulation recommended but deferred given recent IPH and SAH.  On 6/26, blood cultures positive for MSSE. ID consulted 6/27, ceftriaxone switched to cefazolin. Patient continued to have fevers despite abx with increasing leukocytosis on 6/28.  EP consulted for possible ICD extraction given bacteremia but not a candidate at this time.  Heparin gtt started 6/28. Repeat blood cultures 6/27 remain NGTD x 3 days. CTH remains stable 6/29 without neuro changes.  Patient transferred out of neuro ICU, TRH assumed care on 7/3.    Assessment & Plan    Right brain stroke aborted s/p TNK and later that day second stroke with left M2 occlusion s/p IR Left MCA and right cerebellar infarcts left M3 occlusion s/p IR  reperfusion complicated by L frontoparietal ICH and SAH, etiology likely cardioembolic source  -CT 6/18 on presentation no acute finding, subsequent bilateral frontal, parietal, occipital and cerebellum infarcts -CTA head and neck unremarkable.  Repeat CTA head and neck showed news short segment occlusion of distal left M2 status post IR reperfusion complicated by left frontoparietal ICH and SAH -Serial CT head repeated.  CT head 6/28 showed stable hematoma, SAH and IVH have progressed since 10/03/2022 -2D echo showed Large inferior pseudoaneurysm with a depth of 3 cm and a maximum width of roughly 5 cm the wall of the pseudoaneurysm is made above the layer of thrombus that is roughly 1.5 cm thick. EF 30 to 35%, severe hypokinesis of left ventricle. -Lower extremity venous Dopplers negative for DVT -TCD bubble study showed moderate-sized PFO -LDL 63, hemoglobin A1c 5.8 -Prior to admission aspirin 81 mg daily and Plavix 75 mg daily, now on IV heparin per stroke protocol -Management per stroke service, may be a candidate for LTAC   V pseudoaneurysm with 1.5cm thrombus  Embolic CVA/ IPH/ SAH  MSSE bacteremia, persistent fevers -Blood cultures 6/25 with MSSA bacteremia -ID was consulted, recommended patient to have ICD removed however per EP cardiology, patient is  currently not a candidate for lead extraction -Per cards/CVTS, patient not a candidate for redo cardiac surgery for LV pseudoaneurysm with thrombus. -Per ID TEE when able to assess in light of bacteremia associated with fever -Repeat blood cultures remain negative, no new fevers or leukocytosis, continue IV cefazolin    Acute on chronic systolic CHF exacerbation -Goal-directed medical therapy for his heart failure per cardiology team -2D echo 6/23 showed EF 30 to 35%, severe global hypokinesis and wall motion abnormality.  -Patient was on IV Lasix/albumin, now transitioned to Lasix via cortrack     Acute hypoxemic respiratory  failure -combination of fluid overload and poor pulmonary toileting. Patient now tolerating room air.  -Improving, O2 sats 96% on room air    HTN, HLD --Continue home medications- statin, carvedilol, lasix, metoprolol, entresto, and spironolactone      Dysphagia -continue TF with Osmolite at 66ml/hr and PROsource 60ml daily.  -Appreciate SLP recs -palliative medicine consult for GOC, currently receiving tube feeds, ?  PEG tube  Estimated body mass index is 29.44 kg/m as calculated from the following:   Height as of this encounter: 6\' 1"  (1.854 m).   Weight as of this encounter: 101.2 kg.  Code Status: Full code DVT Prophylaxis:  SCDs  Level of Care: Level of care: Telemetry Medical Family Communication: No family at the bedside Disposition Plan:      Remains inpatient appropriate: May be a candidate for LTAC  Antimicrobials:   Anti-infectives (From admission, onward)    Start     Dose/Rate Route Frequency Ordered Stop   10/11/22 1515  cefTRIAXone (ROCEPHIN) 2 g in sodium chloride 0.9 % 100 mL IVPB  Status:  Discontinued        2 g 200 mL/hr over 30 Minutes Intravenous Every 24 hours 10/10/22 1934 10/10/22 1939   10/11/22 1515  cefTRIAXone (ROCEPHIN) 2 g in sodium chloride 0.9 % 100 mL IVPB  Status:  Discontinued        2 g 200 mL/hr over 30 Minutes Intravenous Every 24 hours 10/10/22 1939 10/11/22 0857   10/11/22 0945  ceFAZolin (ANCEF) IVPB 2g/100 mL premix        2 g 200 mL/hr over 30 Minutes Intravenous Every 8 hours 10/11/22 0857     10/10/22 2030  cefTRIAXone (ROCEPHIN) 1 g in sodium chloride 0.9 % 100 mL IVPB  Status:  Discontinued        1 g 200 mL/hr over 30 Minutes Intravenous  Once 10/10/22 1934 10/10/22 1939   10/10/22 2030  cefTRIAXone (ROCEPHIN) 1 g in sodium chloride 0.9 % 100 mL IVPB        1 g 200 mL/hr over 30 Minutes Intravenous  Once 10/10/22 1939 10/10/22 2158   10/09/22 1428  cefTRIAXone (ROCEPHIN) 1 g in sodium chloride 0.9 % 100 mL IVPB  Status:   Discontinued        1 g 200 mL/hr over 30 Minutes Intravenous Every 24 hours 10/09/22 1428 10/10/22 1934   10/09/22 1145  cefTRIAXone (ROCEPHIN) 1 g in sodium chloride 0.9 % 100 mL IVPB  Status:  Discontinued        1 g 200 mL/hr over 30 Minutes Intravenous Every 24 hours 10/09/22 1048 10/09/22 1428          Medications  atorvastatin  80 mg Per Tube Daily   carvedilol  12.5 mg Per Tube BID WC   Chlorhexidine Gluconate Cloth  6 each Topical Daily   feeding supplement (PROSource TF20)  60  mL Per Tube Daily   fiber supplement (BANATROL TF)  60 mL Per Tube BID   furosemide  40 mg Per Tube BID   gabapentin  300 mg Per Tube QHS   insulin aspart  0-20 Units Subcutaneous Q4H   multivitamin with minerals  1 tablet Per Tube Daily   nicotine  7 mg Transdermal Daily   pantoprazole (PROTONIX) IV  40 mg Intravenous QHS   polyethylene glycol  17 g Per Tube BID   sacubitril-valsartan  1 tablet Per Tube BID   spironolactone  12.5 mg Per Tube Daily      Subjective:   [name] was seen and examined today.  No fevers or chills, no new nausea vomiting, fevers, abdominal pain.  Difficult to obtain review of system from the patient.  On tube feeds.   Objective:   Vitals:   10/16/22 2307 10/17/22 0443 10/17/22 0748 10/17/22 1134  BP: 118/72 122/77 97/74 116/62  Pulse: 84 82 74 77  Resp: 16 15 18 18   Temp: 98.6 F (37 C) 99.1 F (37.3 C) 98.2 F (36.8 C) 98.1 F (36.7 C)  TempSrc: Oral Oral Oral Oral  SpO2: 100% 99% 100% 96%  Weight:      Height:        Intake/Output Summary (Last 24 hours) at 10/17/2022 1401 Last data filed at 10/17/2022 1138 Gross per 24 hour  Intake 1067.32 ml  Output 2050 ml  Net -982.68 ml     Wt Readings from Last 3 Encounters:  10/16/22 101.2 kg  08/06/22 106.1 kg  07/23/22 105.7 kg     Exam General: Alert and awake, expressive aphasia, cortrack+ Cardiovascular: S1 S2 auscultated,  RRR Respiratory: Clear to auscultation bilaterally  anteriorly Gastrointestinal: Soft, nontender, nondistended, + bowel sounds Ext: no pedal edema bilaterally Neuro: moves left side, right-sided weakness+    Data Reviewed:  I have personally reviewed following labs    CBC Lab Results  Component Value Date   WBC 8.5 10/17/2022   RBC 5.15 10/17/2022   HGB 15.2 10/17/2022   HCT 43.9 10/17/2022   MCV 85.2 10/17/2022   MCH 29.5 10/17/2022   PLT 233 10/17/2022   MCHC 34.6 10/17/2022   RDW 12.7 10/17/2022   LYMPHSABS 2.6 10/02/2022   MONOABS 0.7 10/02/2022   EOSABS 0.1 10/02/2022   BASOSABS 0.1 10/02/2022     Last metabolic panel Lab Results  Component Value Date   NA 128 (L) 10/17/2022   K 4.5 10/17/2022   CL 93 (L) 10/17/2022   CO2 24 10/17/2022   BUN 31 (H) 10/17/2022   CREATININE 0.88 10/17/2022   GLUCOSE 197 (H) 10/17/2022   GFRNONAA >60 10/17/2022   GFRAA 92 05/06/2020   CALCIUM 9.3 10/17/2022   PHOS 3.9 10/16/2022   PROT 6.5 10/09/2022   ALBUMIN 3.1 (L) 10/09/2022   LABGLOB 2.4 05/25/2021   AGRATIO 1.8 05/25/2021   BILITOT 1.9 (H) 10/09/2022   ALKPHOS 82 10/09/2022   AST 36 10/09/2022   ALT 24 10/09/2022   ANIONGAP 11 10/17/2022    CBG (last 3)  Recent Labs    10/17/22 0442 10/17/22 0748 10/17/22 1259  GLUCAP 223* 165* 203*      Coagulation Profile: No results for input(s): "INR", "PROTIME" in the last 168 hours.   Radiology Studies: I have personally reviewed the imaging studies  DG Swallowing Func-Speech Pathology  Result Date: 10/17/2022 Table formatting from the original result was not included. Modified Barium Swallow Study Patient Details Name: [name]  Nicanor MRN: [id number] Date of Birth: December 10, 1960 Today's Date: 10/17/2022 HPI/PMH: HPI: Pt is a 62 y/o male who presented 6/19 with L weakness and numbness. Pt received TNK then developed new symptoms of aphasia and R weakness. He underwent mechanical thrombectomy L M3 segment MCA 6/19. Pt noted to have small hemorrhage and follow up CT showed  progression of hemorrhage 3cm then 7cm and small acute R cerebellar infarct. PMH: CAD s/p PCI w/stent and 1v CABG, multiple CGAs, systolic HF s/p ICD, HLD, HTN. Clinical Impression: Clinical Impression: This is pts second MBS during acute stay given a change in mental and respiratory status. Today, pts function is worse than prior MBS. Pt is alert, needs visual cues to participate, does not follow verbal commands due to aphasia (cannot swallow twice for example, tilt head or clear oral residue). Pt also with intermittent rapid shallow breathing, almost like exertion or hyperventilation that impacts control. Pt with decreased right labial and lingual tension (prior MBS incorrectly says left - unable to edit) and more significant right sided oral residue. Pts oral phase also more sluggish and slow, cannot sip from a straw or achieve consecutive swallows as seen in prior study. There is increased residue in the pyriform sinuses with thick and puree texture that doesnt clear with dry swallows. This may be right sided pharyngeal residue, but AP view not available due to pts poor mobility to slide himself in the chair. Difficult to visualize pts trachea due to shadow of his shoulder, but pt also with late coughing with nectar/puree/honey that may indicate penetration or aspiration of pyriforms sinus residue. Definitive aspiration of residue WITHOUT cough seen with thin liquids at the end of study. At this juncture, safe and adequate oral intake seems less likely in the short term. Pt may be able to initaite careful trials of nectar teaspoon and purees with full supervision, but risk of aspiration is higher than prior. Pt may benefit from discussion with palliative care about long term method of nutrition if intensive rehabilition is desired. If a more comfort approach is chosen, would recommend liberalizing pts diet with known risk. Factors that may increase risk of adverse event in presence of aspiration [name] &  [name] 2021): Factors that may increase risk of adverse event in presence of aspiration [name] [employer] [name] 2021): Limited mobility; Inadequate oral hygiene; Dependence for feeding and/or oral hygiene; Aspiration of thick, dense, and/or acidic materials; Weak cough; Reduced cognitive function Recommendations/Plan: Swallowing Evaluation Recommendations Swallowing Evaluation Recommendations Recommendations: NPO Treatment Plan Treatment Plan Treatment recommendations: Therapy as outlined in treatment plan below Follow-up recommendations: Acute inpatient rehab (3 hours/day) Functional status assessment: Patient has had a recent decline in their functional status and demonstrates the ability to make significant improvements in function in a reasonable and predictable amount of time. Treatment frequency: Min 2x/week Treatment duration: 2 weeks Interventions: Aspiration precaution training Recommendations Recommendations for follow up therapy are one component of a multi-disciplinary discharge planning process, led by the attending physician.  Recommendations may be updated based on patient status, additional functional criteria and insurance authorization. Assessment: Orofacial Exam: Orofacial Exam Oral Cavity - Dentition: Edentulous Oral Motor/Sensory Function: Suspected cranial nerve impairment CN VII - Facial: Right motor impairment Anatomy: No data recorded Boluses Administered: Boluses Administered Boluses Administered: Thin liquids (Level 0); Mildly thick liquids (Level 2, nectar thick); Moderately thick liquids (Level 3, honey thick); Puree  Oral Impairment Domain: Oral Impairment Domain Lip Closure: Interlabial escape, no progression to anterior lip Tongue control during bolus hold: Not  tested Bolus preparation/mastication: -- (didnt test solids) Bolus transport/lingual motion: Repetitive/disorganized tongue motion Oral residue: Residue collection on oral structures Location of oral residue : Floor of mouth;  Lateral sulci Initiation of pharyngeal swallow : Pyriform sinuses  Pharyngeal Impairment Domain: Pharyngeal Impairment Domain Soft palate elevation: No bolus between soft palate (SP)/pharyngeal wall (PW) Laryngeal elevation: Partial superior movement of thyroid cartilage/partial approximation of arytenoids to epiglottic petiole Anterior hyoid excursion: Partial anterior movement Epiglottic movement: Complete inversion Laryngeal vestibule closure: Complete, no air/contrast in laryngeal vestibule Pharyngeal stripping wave : Present - complete Pharyngeal contraction (A/P view only): -- (suspected) Pharyngoesophageal segment opening: Complete distension and complete duration, no obstruction of flow Tongue base retraction: Trace column of contrast or air between tongue base and PPW Pharyngeal residue: Collection of residue within or on pharyngeal structures Location of pharyngeal residue: Pyriform sinuses  Esophageal Impairment Domain: No data recorded Pill: No data recorded Penetration/Aspiration Scale Score: Penetration/Aspiration Scale Score 1.  Material does not enter airway: Mildly thick liquids (Level 2, nectar thick); Moderately thick liquids (Level 3, honey thick); Puree 8.  Material enters airway, passes BELOW cords without attempt by patient to eject out (silent aspiration) : Thin liquids (Level 0) Compensatory Strategies: No data recorded  General Information: Caregiver present: No  Diet Prior to this Study: NPO; Cortrak/Small bore NG tube   Temperature : Febrile   Respiratory Status: Increased WOB; Tachypneic   Supplemental O2: None (Room air)   History of Recent Intubation: No  Behavior/Cognition: Alert; Requires cueing Self-Feeding Abilities: Needs assist with self-feeding Baseline vocal quality/speech: Not observed Volitional Cough: Unable to elicit Volitional Swallow: Unable to elicit No data recorded Goal Planning: No data recorded No data recorded No data recorded No data recorded No data recorded Pain:  Pain Assessment Pain Assessment: Faces Faces Pain Scale: 0 Facial Expression: 0 Body Movements: 0 Muscle Tension: 0 Compliance with ventilator (intubated pts.): N/A Vocalization (extubated pts.): 0 CPOT Total: 0 Pain Intervention(s): Monitored during session End of Session: Start Time:SLP Start Time (ACUTE ONLY): 0950 Stop Time: SLP Stop Time (ACUTE ONLY): 1015 Time Calculation:SLP Time Calculation (min) (ACUTE ONLY): 25 min Charges: SLP Evaluations $ SLP Speech Visit: 1 Visit SLP Evaluations $MBS Swallow: 1 Procedure $Swallowing Treatment: 1 Procedure SLP visit diagnosis: SLP Visit Diagnosis: Dysphagia, oropharyngeal phase (R13.12) Past Medical History: Past Medical History: Diagnosis Date  Abnormal EKG   hx of ischemia showing up on ekg's  Acute myopericarditis   a. 03/2013: readm with hypoxia, tachycardia, elevated ESR/CRP, elevated troponin with Dressler syndrome and myopericarditis; treated with steroids.  Acute respiratory failure (HCC)   a. 01/2013: VDRF. b. 03/2013: hypoxia requiring supp O2 during adm, resolved by discharge.  C. difficile colitis   a. 01/2013 during prolonged adm. b. Recurred 03/2013.  Cardiac tamponade   a. 01/2013 s/p drain.  Cataracts, bilateral   Coronary artery disease   a. s/p MI in 2009 in [place] with stenting of the LCX and LAD;  b. 12/2012 NSTEMI/CAD: LM nl, LAD patent prox stent, LCX 50-70 isr (FFR 0.84), RCA dom, 149m, EF 40-45%-->Med Rx. c. 01/2013: anterolateral STEMI complicated by pericardial effusion (presumed purulent pericarditis) and tamponade s/p drain, ruptured LV pseudoaneurysm s/p CorMatrix patch, CABGx1 (SVG-OM1), fever, CVA, VDRF, C diff.  CVA (cerebral infarction)   a. 01/2013 in setting of prolonged hospitalization - residual L arm weakness.  Dressler syndrome (HCC)   a. 03/2013: readm with hypoxia, tachycardia, elevated ESR/CRP, elevated troponin with Dressler syndrome and myopericarditis; treated with steroids.  Glaucoma  Hyperlipidemia   Hypokalemia   Hyponatremia    a. During late 2014.  Ischemic cardiomyopathy   a. Sept/Oct 2014: EF ~40% (ICM). b. 03/2013: EF 25-30%. Off ACEI due to hypotension (MIXED NICM/ICM).  Optic neuropathy, ischemic   Pericardial effusion   a. 01/2013: pericardial effusion (presumed purulent pericarditis) and cardiac tamponade s/p drain. b. Persistent moderate pericardial effusion 03/2013.  Pre-diabetes   Pseudoaneurysm of left ventricle of heart   a. 01/2013:  Ruptured inferoposterior LV pseudoaneurysm s/p CorMatrix patch.  Sinus bradycardia   asymptomatic  Stroke [facility]) 2009  weak lt side-lt arm  Tobacco abuse   Wears dentures   top Past Surgical History: Past Surgical History: Procedure Laterality Date  CARDIAC CATHETERIZATION  01/06/2013  CATARACT EXTRACTION Bilateral 05/2021  COLONOSCOPY WITH PROPOFOL N/A 07/03/2016  Procedure: COLONOSCOPY WITH PROPOFOL;  Surgeon: Ruffin Frederick, MD;  Location: [facility];  Service: Gastroenterology;  Laterality: N/A;  CORONARY ANGIOPLASTY WITH STENT PLACEMENT  2000's X 2  "1 + 1" (01/06/2013)  CORONARY ARTERY BYPASS GRAFT N/A 01/28/2013  Procedure: CORONARY ARTERY BYPASS GRAFTING (CABG);  Surgeon: Loreli Slot, MD;  Location: [facility] OR;  Service: Open Heart Surgery;  Laterality: N/A;  CABG x one, using left greater saphenous vein harvested endoscopically  ICD IMPLANT N/A 05/25/2020  Procedure: ICD IMPLANT;  Surgeon: Hillis Range, MD;  Location: [facility] INVASIVE CV LAB;  Service: Cardiovascular;  Laterality: N/A;  INTRAOPERATIVE TRANSESOPHAGEAL ECHOCARDIOGRAM N/A 01/28/2013  Procedure: INTRAOPERATIVE TRANSESOPHAGEAL ECHOCARDIOGRAM;  Surgeon: Loreli Slot, MD;  Location: [facility] OR;  Service: Open Heart Surgery;  Laterality: N/A;  IR CT HEAD LTD  10/03/2022  IR PERCUTANEOUS ART THROMBECTOMY/INFUSION INTRACRANIAL INC DIAG ANGIO  10/03/2022  IR US GUIDE VASC ACCESS RIGHT  10/03/2022  LEFT HEART CATH AND CORS/GRAFTS ANGIOGRAPHY N/A 09/13/2020  Procedure: LEFT HEART CATH AND CORS/GRAFTS ANGIOGRAPHY;  Surgeon:  Swaziland, Peter M, MD;  Location: [facility] INVASIVE CV LAB;  Service: Cardiovascular;  Laterality: N/A;  LEFT HEART CATHETERIZATION WITH CORONARY ANGIOGRAM N/A 01/06/2013  Procedure: LEFT HEART CATHETERIZATION WITH CORONARY ANGIOGRAM;  Surgeon: Peter M Swaziland, MD;  Location: [facility] CATH LAB;  Service: Cardiovascular;  Laterality: N/A;  LEFT HEART CATHETERIZATION WITH CORONARY ANGIOGRAM N/A 01/21/2013  Procedure: LEFT HEART CATHETERIZATION WITH CORONARY ANGIOGRAM;  Surgeon: Kathleene Hazel, MD;  Location: [facility] CATH LAB;  Service: Cardiovascular;  Laterality: N/A;  LUMBAR LAMINECTOMY/ DECOMPRESSION WITH MET-RX Right 06/30/2018  Procedure: Right minimally invasive Lumbar Three-Four Far lateral discectomy;  Surgeon: Jadene Pierini, MD;  Location: [facility] OR;  Service: Neurosurgery;  Laterality: Right;  Right minimally invasive Lumbar Three-Four Far lateral discectomy  MASS EXCISION Right 07/21/2014  Procedure: EXCISION RIGHT NECK MASS;  Surgeon: Harriette Bouillon, MD;  Location: [facility];  Service: General;  Laterality: Right;  PERICARDIAL TAP N/A 01/24/2013  Procedure: PERICARDIAL TAP;  Surgeon: Micheline Chapman, MD;  Location: [facility] CATH LAB;  Service: Cardiovascular;  Laterality: N/A;  RADIOLOGY WITH ANESTHESIA N/A 10/03/2022  Procedure: IR WITH ANESTHESIA;  Surgeon: Julieanne Cotton, MD;  Location: [facility] OR;  Service: Radiology;  Laterality: N/A;  RIGHT HEART CATHETERIZATION Right 01/24/2013  Procedure: RIGHT HEART CATH;  Surgeon: Micheline Chapman, MD;  Location: [facility] CATH LAB;  Service: Cardiovascular;  Laterality: Right;  VENTRICULAR ANEURYSM RESECTION N/A 01/28/2013  Procedure: LEFT VENTRICULAR ANEURYSM REPAIR;  Surgeon: Loreli Slot, MD;  Location: [facility] OR;  Service: Open Heart Surgery;  Laterality: N/A; Claudine Mouton 10/17/2022, 11:23 AM      Thad Ranger M.D. [facility] 10/17/2022, 2:01  PM  Available via Epic secure chat 7am-7pm After 7 pm, please refer to night coverage provider  listed on amion.

## 2022-10-17 NOTE — Plan of Care (Signed)
  Problem: Ischemic Stroke/TIA Tissue Perfusion: Goal: Complications of ischemic stroke/TIA will be minimized Outcome: Progressing   

## 2022-10-17 NOTE — Plan of Care (Signed)
  Problem: Education: Goal: Knowledge of disease or condition will improve Outcome: Not Progressing   Problem: Self-Care: Goal: Ability to participate in self-care as condition permits will improve Outcome: Not Progressing

## 2022-10-17 NOTE — Care Management Important Message (Signed)
Important Message  Patient Details  Name: [name] MRN: [id number] Date of Birth: 12-Dec-1960   Medicare Important Message Given:  Yes     [name] 10/17/2022, 1:46 PM

## 2022-10-17 NOTE — Progress Notes (Signed)
ANTICOAGULATION CONSULT NOTE - Follow Up Consult  Pharmacy Consult for heparin Indication:  pseudoaneurysm  No Known Allergies  Patient Measurements: Height: 6\' 1"  (185.4 cm) Weight: 101.2 kg (223 lb 1.7 oz) IBW/kg (Calculated) : 79.9 Heparin Dosing Weight: 101 kg  Vital Signs: Temp: 98.2 F (36.8 C) (07/03 0748) Temp Source: Oral (07/03 0748) BP: 97/74 (07/03 0748) Pulse Rate: 74 (07/03 0748)  Labs: Recent Labs    10/15/22 0421 10/16/22 0453 10/17/22 0439  HGB 14.2 14.5 15.2  HCT 41.7 42.9 43.9  PLT 171 195 233  HEPARINUNFRC 0.34 0.39 0.44  CREATININE  --  0.89 0.88    Estimated Creatinine Clearance: 110.2 mL/min (by C-G formula based on SCr of 0.88 mg/dL).   Medications:  Scheduled:   atorvastatin  80 mg Per Tube Daily   carvedilol  12.5 mg Per Tube BID WC   Chlorhexidine Gluconate Cloth  6 each Topical Daily   feeding supplement (PROSource TF20)  60 mL Per Tube Daily   fiber supplement (BANATROL TF)  60 mL Per Tube BID   furosemide  40 mg Per Tube BID   gabapentin  300 mg Per Tube QHS   insulin aspart  0-20 Units Subcutaneous Q4H   multivitamin with minerals  1 tablet Per Tube Daily   nicotine  7 mg Transdermal Daily   pantoprazole (PROTONIX) IV  40 mg Intravenous QHS   polyethylene glycol  17 g Per Tube BID   sacubitril-valsartan  1 tablet Per Tube BID   spironolactone  12.5 mg Per Tube Daily   Infusions:   sodium chloride Stopped (10/16/22 1641)    ceFAZolin (ANCEF) IV 2 g (10/17/22 0601)   feeding supplement (OSMOLITE 1.5 CAL) 1,000 mL (10/17/22 0011)   heparin 1,200 Units/hr (10/16/22 2300)    Assessment: 62 YOM with hx LV pseudoaneurysm s/p patch now with large thrombus burden, admitted with ischemic stroke with hemorrhagic conversion, CT stable today and pharmacy consulted to cautiously start heparin gtt for LV pseudoaneurysm, no bolus with low end goals.     Heparin level therapeutic at 0.44 on 7/3 - on Heparin 1200 units/hr with CBC stable.  Per RN no signs/symptoms of bleeding or issues running the drip.  Goal of Therapy:  Heparin level 0.3-0.5 units/ml Monitor platelets by anticoagulation protocol: Yes   Plan:  Continue heparin 1200 units/hr Daily Heparin level and CBC Monitor for signs/symptoms of bleeding  Roslyn Smiling, PharmD. Pharmacy Resident 10/17/2022,9:36 AM

## 2022-10-17 NOTE — Progress Notes (Signed)
STROKE TEAM PROGRESS NOTE   INTERVAL HISTORY Patient remains hemodynamically stable with Tmax of 99.3.Marland Kitchen  WBC count is normal his neurological exam is improved, and he is able to follow commands with left upper and bilateral lower extremities.  He has been transferred out of the ICU.  Vitals:   10/16/22 2307 10/17/22 0443 10/17/22 0748 10/17/22 1134  BP: 118/72 122/77 97/74 116/62  Pulse: 84 82 74 77  Resp: 16 15 18 18   Temp: 98.6 F (37 C) 99.1 F (37.3 C) 98.2 F (36.8 C) 98.1 F (36.7 C)  TempSrc: Oral Oral Oral Oral  SpO2: 100% 99% 100% 96%  Weight:      Height:       CBC:  Recent Labs  Lab 10/16/22 0453 10/17/22 0439  WBC 7.9 8.5  HGB 14.5 15.2  HCT 42.9 43.9  MCV 86.7 85.2  PLT 195 233   Basic Metabolic Panel:  Recent Labs  Lab 10/16/22 0453 10/16/22 1526 10/17/22 0439  NA 130*  --  128*  K 4.5  --  4.5  CL 89*  --  93*  CO2 26  --  24  GLUCOSE 157*  --  197*  BUN 26*  --  31*  CREATININE 0.89  --  0.88  CALCIUM 9.3  --  9.3  MG  --  2.8*  --   PHOS  --  3.9  --     IMAGING past 24 hours DG Swallowing Func-Speech Pathology  Result Date: 10/17/2022 Table formatting from the original result was not included. Modified Barium Swallow Study Patient Details Name: [name] MRN: [id number] Date of Birth: 05-06-1960 Today's Date: 10/17/2022 HPI/PMH: HPI: Pt is a 62 y/o male who presented 6/19 with L weakness and numbness. Pt received TNK then developed new symptoms of aphasia and R weakness. He underwent mechanical thrombectomy L M3 segment MCA 6/19. Pt noted to have small hemorrhage and follow up CT showed progression of hemorrhage 3cm then 7cm and small acute R cerebellar infarct. PMH: CAD s/p PCI w/stent and 1v CABG, multiple CGAs, systolic HF s/p ICD, HLD, HTN. Clinical Impression: Clinical Impression: This is pts second MBS during acute stay given a change in mental and respiratory status. Today, pts function is worse than prior MBS. Pt is alert, needs visual  cues to participate, does not follow verbal commands due to aphasia (cannot swallow twice for example, tilt head or clear oral residue). Pt also with intermittent rapid shallow breathing, almost like exertion or hyperventilation that impacts control. Pt with decreased right labial and lingual tension (prior MBS incorrectly says left - unable to edit) and more significant right sided oral residue. Pts oral phase also more sluggish and slow, cannot sip from a straw or achieve consecutive swallows as seen in prior study. There is increased residue in the pyriform sinuses with thick and puree texture that doesnt clear with dry swallows. This may be right sided pharyngeal residue, but AP view not available due to pts poor mobility to slide himself in the chair. Difficult to visualize pts trachea due to shadow of his shoulder, but pt also with late coughing with nectar/puree/honey that may indicate penetration or aspiration of pyriforms sinus residue. Definitive aspiration of residue WITHOUT cough seen with thin liquids at the end of study. At this juncture, safe and adequate oral intake seems less likely in the short term. Pt may be able to initaite careful trials of nectar teaspoon and purees with full supervision, but risk of aspiration  is higher than prior. Pt may benefit from discussion with palliative care about long term method of nutrition if intensive rehabilition is desired. If a more comfort approach is chosen, would recommend liberalizing pts diet with known risk. Factors that may increase risk of adverse event in presence of aspiration [name] & [name] 2021): Factors that may increase risk of adverse event in presence of aspiration [name] & [name] 2021): Limited mobility; Inadequate oral hygiene; Dependence for feeding and/or oral hygiene; Aspiration of thick, dense, and/or acidic materials; Weak cough; Reduced cognitive function Recommendations/Plan: Swallowing Evaluation Recommendations Swallowing  Evaluation Recommendations Recommendations: NPO Treatment Plan Treatment Plan Treatment recommendations: Therapy as outlined in treatment plan below Follow-up recommendations: Acute inpatient rehab (3 hours/day) Functional status assessment: Patient has had a recent decline in their functional status and demonstrates the ability to make significant improvements in function in a reasonable and predictable amount of time. Treatment frequency: Min 2x/week Treatment duration: 2 weeks Interventions: Aspiration precaution training Recommendations Recommendations for follow up therapy are one component of a multi-disciplinary discharge planning process, led by the attending physician.  Recommendations may be updated based on patient status, additional functional criteria and insurance authorization. Assessment: Orofacial Exam: Orofacial Exam Oral Cavity - Dentition: Edentulous Oral Motor/Sensory Function: Suspected cranial nerve impairment CN VII - Facial: Right motor impairment Anatomy: No data recorded Boluses Administered: Boluses Administered Boluses Administered: Thin liquids (Level 0); Mildly thick liquids (Level 2, nectar thick); Moderately thick liquids (Level 3, honey thick); Puree  Oral Impairment Domain: Oral Impairment Domain Lip Closure: Interlabial escape, no progression to anterior lip Tongue control during bolus hold: Not tested Bolus preparation/mastication: -- (didnt test solids) Bolus transport/lingual motion: Repetitive/disorganized tongue motion Oral residue: Residue collection on oral structures Location of oral residue : Floor of mouth; Lateral sulci Initiation of pharyngeal swallow : Pyriform sinuses  Pharyngeal Impairment Domain: Pharyngeal Impairment Domain Soft palate elevation: No bolus between soft palate (SP)/pharyngeal wall (PW) Laryngeal elevation: Partial superior movement of thyroid cartilage/partial approximation of arytenoids to epiglottic petiole Anterior hyoid excursion: Partial  anterior movement Epiglottic movement: Complete inversion Laryngeal vestibule closure: Complete, no air/contrast in laryngeal vestibule Pharyngeal stripping wave : Present - complete Pharyngeal contraction (A/P view only): -- (suspected) Pharyngoesophageal segment opening: Complete distension and complete duration, no obstruction of flow Tongue base retraction: Trace column of contrast or air between tongue base and PPW Pharyngeal residue: Collection of residue within or on pharyngeal structures Location of pharyngeal residue: Pyriform sinuses  Esophageal Impairment Domain: No data recorded Pill: No data recorded Penetration/Aspiration Scale Score: Penetration/Aspiration Scale Score 1.  Material does not enter airway: Mildly thick liquids (Level 2, nectar thick); Moderately thick liquids (Level 3, honey thick); Puree 8.  Material enters airway, passes BELOW cords without attempt by patient to eject out (silent aspiration) : Thin liquids (Level 0) Compensatory Strategies: No data recorded  General Information: Caregiver present: No  Diet Prior to this Study: NPO; Cortrak/Small bore NG tube   Temperature : Febrile   Respiratory Status: Increased WOB; Tachypneic   Supplemental O2: None (Room air)   History of Recent Intubation: No  Behavior/Cognition: Alert; Requires cueing Self-Feeding Abilities: Needs assist with self-feeding Baseline vocal quality/speech: Not observed Volitional Cough: Unable to elicit Volitional Swallow: Unable to elicit No data recorded Goal Planning: No data recorded No data recorded No data recorded No data recorded No data recorded Pain: Pain Assessment Pain Assessment: Faces Faces Pain Scale: 0 Facial Expression: 0 Body Movements: 0 Muscle Tension: 0 Compliance with ventilator (intubated pts.):  N/A Vocalization (extubated pts.): 0 CPOT Total: 0 Pain Intervention(s): Monitored during session End of Session: Start Time:SLP Start Time (ACUTE ONLY): 0950 Stop Time: SLP Stop Time (ACUTE ONLY): 1015  Time Calculation:SLP Time Calculation (min) (ACUTE ONLY): 25 min Charges: SLP Evaluations $ SLP Speech Visit: 1 Visit SLP Evaluations $MBS Swallow: 1 Procedure $Swallowing Treatment: 1 Procedure SLP visit diagnosis: SLP Visit Diagnosis: Dysphagia, oropharyngeal phase (R13.12) Past Medical History: Past Medical History: Diagnosis Date  Abnormal EKG   hx of ischemia showing up on ekg's  Acute myopericarditis   a. 03/2013: readm with hypoxia, tachycardia, elevated ESR/CRP, elevated troponin with Dressler syndrome and myopericarditis; treated with steroids.  Acute respiratory failure (HCC)   a. 01/2013: VDRF. b. 03/2013: hypoxia requiring supp O2 during adm, resolved by discharge.  C. difficile colitis   a. 01/2013 during prolonged adm. b. Recurred 03/2013.  Cardiac tamponade   a. 01/2013 s/p drain.  Cataracts, bilateral   Coronary artery disease   a. s/p MI in 2009 in [place] with stenting of the LCX and LAD;  b. 12/2012 NSTEMI/CAD: LM nl, LAD patent prox stent, LCX 50-70 isr (FFR 0.84), RCA dom, 157m, EF 40-45%-->Med Rx. c. 01/2013: anterolateral STEMI complicated by pericardial effusion (presumed purulent pericarditis) and tamponade s/p drain, ruptured LV pseudoaneurysm s/p CorMatrix patch, CABGx1 (SVG-OM1), fever, CVA, VDRF, C diff.  CVA (cerebral infarction)   a. 01/2013 in setting of prolonged hospitalization - residual L arm weakness.  Dressler syndrome (HCC)   a. 03/2013: readm with hypoxia, tachycardia, elevated ESR/CRP, elevated troponin with Dressler syndrome and myopericarditis; treated with steroids.  Glaucoma   Hyperlipidemia   Hypokalemia   Hyponatremia   a. During late 2014.  Ischemic cardiomyopathy   a. Sept/Oct 2014: EF ~40% (ICM). b. 03/2013: EF 25-30%. Off ACEI due to hypotension (MIXED NICM/ICM).  Optic neuropathy, ischemic   Pericardial effusion   a. 01/2013: pericardial effusion (presumed purulent pericarditis) and cardiac tamponade s/p drain. b. Persistent moderate pericardial effusion 03/2013.   Pre-diabetes   Pseudoaneurysm of left ventricle of heart   a. 01/2013:  Ruptured inferoposterior LV pseudoaneurysm s/p CorMatrix patch.  Sinus bradycardia   asymptomatic  Stroke [facility]) 2009  weak lt side-lt arm  Tobacco abuse   Wears dentures   top Past Surgical History: Past Surgical History: Procedure Laterality Date  CARDIAC CATHETERIZATION  01/06/2013  CATARACT EXTRACTION Bilateral 05/2021  COLONOSCOPY WITH PROPOFOL N/A 07/03/2016  Procedure: COLONOSCOPY WITH PROPOFOL;  Surgeon: Ruffin Frederick, MD;  Location: [facility];  Service: Gastroenterology;  Laterality: N/A;  CORONARY ANGIOPLASTY WITH STENT PLACEMENT  2000's X 2  "1 + 1" (01/06/2013)  CORONARY ARTERY BYPASS GRAFT N/A 01/28/2013  Procedure: CORONARY ARTERY BYPASS GRAFTING (CABG);  Surgeon: Loreli Slot, MD;  Location: [facility] OR;  Service: Open Heart Surgery;  Laterality: N/A;  CABG x one, using left greater saphenous vein harvested endoscopically  ICD IMPLANT N/A 05/25/2020  Procedure: ICD IMPLANT;  Surgeon: Hillis Range, MD;  Location: [facility] INVASIVE CV LAB;  Service: Cardiovascular;  Laterality: N/A;  INTRAOPERATIVE TRANSESOPHAGEAL ECHOCARDIOGRAM N/A 01/28/2013  Procedure: INTRAOPERATIVE TRANSESOPHAGEAL ECHOCARDIOGRAM;  Surgeon: Loreli Slot, MD;  Location: [facility] OR;  Service: Open Heart Surgery;  Laterality: N/A;  IR CT HEAD LTD  10/03/2022  IR PERCUTANEOUS ART THROMBECTOMY/INFUSION INTRACRANIAL INC DIAG ANGIO  10/03/2022  IR US GUIDE VASC ACCESS RIGHT  10/03/2022  LEFT HEART CATH AND CORS/GRAFTS ANGIOGRAPHY N/A 09/13/2020  Procedure: LEFT HEART CATH AND CORS/GRAFTS ANGIOGRAPHY;  Surgeon: Swaziland, Peter M, MD;  Location: [facility] INVASIVE  CV LAB;  Service: Cardiovascular;  Laterality: N/A;  LEFT HEART CATHETERIZATION WITH CORONARY ANGIOGRAM N/A 01/06/2013  Procedure: LEFT HEART CATHETERIZATION WITH CORONARY ANGIOGRAM;  Surgeon: Peter M Swaziland, MD;  Location: [facility] CATH LAB;  Service: Cardiovascular;  Laterality: N/A;  LEFT HEART CATHETERIZATION WITH  CORONARY ANGIOGRAM N/A 01/21/2013  Procedure: LEFT HEART CATHETERIZATION WITH CORONARY ANGIOGRAM;  Surgeon: Kathleene Hazel, MD;  Location: [facility] CATH LAB;  Service: Cardiovascular;  Laterality: N/A;  LUMBAR LAMINECTOMY/ DECOMPRESSION WITH MET-RX Right 06/30/2018  Procedure: Right minimally invasive Lumbar Three-Four Far lateral discectomy;  Surgeon: Jadene Pierini, MD;  Location: [facility] OR;  Service: Neurosurgery;  Laterality: Right;  Right minimally invasive Lumbar Three-Four Far lateral discectomy  MASS EXCISION Right 07/21/2014  Procedure: EXCISION RIGHT NECK MASS;  Surgeon: Harriette Bouillon, MD;  Location: [facility];  Service: General;  Laterality: Right;  PERICARDIAL TAP N/A 01/24/2013  Procedure: PERICARDIAL TAP;  Surgeon: Micheline Chapman, MD;  Location: [facility] CATH LAB;  Service: Cardiovascular;  Laterality: N/A;  RADIOLOGY WITH ANESTHESIA N/A 10/03/2022  Procedure: IR WITH ANESTHESIA;  Surgeon: Julieanne Cotton, MD;  Location: [facility] OR;  Service: Radiology;  Laterality: N/A;  RIGHT HEART CATHETERIZATION Right 01/24/2013  Procedure: RIGHT HEART CATH;  Surgeon: Micheline Chapman, MD;  Location: [facility] CATH LAB;  Service: Cardiovascular;  Laterality: Right;  VENTRICULAR ANEURYSM RESECTION N/A 01/28/2013  Procedure: LEFT VENTRICULAR ANEURYSM REPAIR;  Surgeon: Loreli Slot, MD;  Location: [facility] OR;  Service: Open Heart Surgery;  Laterality: N/A; DeBlois, [name] 10/17/2022, 11:23 AM    Physical Exam  Constitutional: Appears well-developed and well-nourished.  Middle-aged African-American male Cardiovascular: Normal rate and regular rhythm.  Respiratory: Regular, unlabored respirations on supplemental O2 Skin: WDI  Neuro: Mental Status: Patient is awake, alert, speech is dysarthric with expressive aphasia. .Following commands briskly on the left side Cranial Nerves: II: Pupils are equal, round, and reactive to light.   III,IV, VI: EOMI without ptosis or diplopia.  VII: Mild  right facial droop  VIII: Hearing is intact to voice XII: tongue is midline without atrophy or fasciculations.  Motor: Tone is normal. Bulk is normal.  Moves left upper and lower extremities to command, will does not move right upper extremity but moves right lower extremity to command     ASSESSMENT/PLAN [name] is a 62 y.o. male with history of CAD s/p PCI w/ stent to LCS and LAD, s/p 1v CABG, multiple CVAs (R cerebellar 04/2022),  systolic HF s/p ICD, HLD, HTN presenting with slurred speech and L sided weakness now s/p TNK and IR thrombectomy of L MCA (M3 segment) c/b  hemorrhage.   TIA - Right brain stroke aborted s/p TNK and later that day second stroke with left M2 occlusion s/p IR Stroke - Left MCA and right cerebellar infarcts left M3 occlusion s/p IR with TICIs, complicated by L frontoparietal ICH and SAH, etiology likely cardioembolic source  CT 6/18 no acute finding, but the bilateral frontal, parietal, occipital and cerebellum infarcts CTA head and neck unremarkable CTA head and neck repeat new short segment occlusion of distal left M2 S/p IR with left M3 TICIs reperfusion  post IR CT Hyperdensity within the left sylvian fissure may represent small subarachnoid hemorrhage versus contrast. CT Head: Progressed hemorrhage in L frontoparietal region w/ 3cm hematoma and regional SAH MRI  progressive L fronto parietal hematoma (~7cm), regional SAH, increased mass effect.  Likely underlying infarct. Sm R cerebellar infarct, acute. Numerous chornic cerebral and cerebellar infarcts Repeat  CT 6/19 increase in size of the left frontal/parietal hematoma. New extension into the left lateral ventricle. CT repeat 6/22 x 2 - New component of intraparenchymal hemorrhage in the posterior right hemisphere that measures approximately 2.5 x 1.4 cm. (On my review, more like right brain redistribution instead of new hemorrhage)  Repeat CT 6/23: Unchanged appearance of bilateral mixed  intraparenchymal and subarachnoid hemorrhage. Trace rightward midline shift is unchanged. No hydrocephalus CT repeat 6/25 stable hemorrhage, unchanged CT Head 6/28-  stable hematoma. SAH and IVH have regressed since 10/03/2022.  Echo: Large inferior pseudoaneurysm with a depth of 3 cm and a maximum width of roughly 5 cm the wall of the pseudoaneurysm is made above the layer of thrombus that is roughly 1.5 cm thick. EF 30 to 35%, severe hypokinesis of left ventricle. No afib with ICD interrogation LE venous doppler neg for DVT TCD bubble study showed moderate sized PFO LDL 63 HgbA1c 5.8 UDS neg VTE prophylaxis - SQH  aspirin 81 mg daily and clopidogrel 75 mg daily prior to admission, now on IV Heparin per stroke protocol Therapy recommendations:  SNF Disposition:  pending  CHF s/p ICD Systolic HF, s/p ICD 2/2 ICM.  Echo EF 30-35%  Trop 45 -> 43 -> 39 -> 35 CK 3714, CKMB 3.5 CXR 6/25 central vascular congestion and developing interstitial edema.  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  Cardiology on board, appreciate assistance  CTA of the heart intact chronic patch repair to inferior and inferior septal pseudoaneurysm, large burden of thrombus on top of patch repair, severe biventricular failure with D-shaped septum, occluded SVG to OM Heparin IV started 6/28 Ultimately patient will  need to repeat undergo repeat pseudoaneurysm repair if he becomes healthy enough to tolerate surgery  Hyperlipidemia Home meds:  atorvastatin 80 LDL 63, goal < 70 Lipitor 80 resumed    Continue statin at discharge  Tobacco abuse Current smoker Smoking cessation counseling will be provided Nicotine patch provided  Fever ? Aspiration pneumonia Tmax 101.6 -> 99.8-> afebrile->100.8 -> 102-> 101.7-> 101.1 -> 101-> 100.3-> 99.4 WBC 7.6->9.4->10.2->9.8->10.2-> 9.2 UA negative CXR central vascular congestion and developing interstitial edema.  CXR 6/26 similar appearance of atelectasis at both lung bases Procalc negative.  Blood culture 2/4 bottles staph epidermidis, ID consulted, appreciate recommendations Blood culture repeat pending -no growth x 3 days NT suction PRN Ceftriaxone -> ancef ID consulted EP on board - not candidate for ICD extraction at this time Cardiology felt pt not candidate for TEE now given respiratory distress, however they are willing to do it if he is intubated  Dysphagia  Vomiting on zofran PRN, improved Pt ate well 6/24, nocturnal TF d/c'ed -> fever with possible aspiration -> NPO and TF restarted on 6/25 6/27 having difficulty coordinating swallowing and respirations  SLP will need to re evaluate  PFO Known PFO in the past TCD bubble study showed a moderate-sized PFO LE venous Doppler negative  for DVT  Not an emergent issue at this time  Other stroke risk factors Obesity with BMI 32.23 CAD  Other Active Problems Thrombocytopenia, platelet 110->89-> 172-> 190-> stable CK 436 -> 3714 -> 610-> 490  Hospital day # 15  Patient's fever has come down and white count remains normal.  His neurological exam shows improvement.  Continue antibiotics as per ID team.  Speech therapy.  Recommend NPO.  Need to consider PEG tube soon.  Continue IV heparin for LV pseudoaneurysm and clot.  Appreciate medical hospitalist team as you may his care.  Discussed with Dr.  Isidoro Donning.  No family available at the bedside for discussion.  Greater than 50% time during this 25-minute visit was spent in counseling and coordination of care discussion with patient and care team and answering questions.     Delia Heady, MD Medical Director [facility] Pager: [phone] 10/17/2022 1:31 PM

## 2022-10-17 NOTE — Progress Notes (Signed)
Modified Barium Swallow Study  Patient Details  Name: [name] MRN: [id number] Date of Birth: [birth year]  Today's Date: 10/17/2022  Modified Barium Swallow completed.  Full report located under Chart Review in the Imaging Section.  History of Present Illness Pt is a 62 y/o male who presented 6/19 with L weakness and numbness. Pt received TNK then developed new symptoms of aphasia and R weakness. He underwent mechanical thrombectomy L M3 segment MCA 6/19. Pt noted to have small hemorrhage and follow up CT showed progression of hemorrhage 3cm then 7cm and small acute R cerebellar infarct. PMH: CAD s/p PCI w/stent and 1v CABG, multiple CGAs, systolic HF s/p ICD, HLD, HTN.   Clinical Impression This is pts second MBS during acute stay given a change in mental and respiratory status. Today, pts function is worse than prior MBS. Pt is alert, needs visual cues to participate, does not follow verbal commands due to aphasia (cannot swallow twice for example, tilt head or clear oral residue). Pt also with intermittent rapid shallow breathing, almost like exertion or hyperventilation that impacts control. Pt with decreased right labial and lingual tension (prior MBS incorrectly says left - unable to edit) and more significant right sided oral residue. Pts oral phase also more sluggish and slow, cannot sip from a straw or achieve consecutive swallows as seen in prior study. There is increased residue in the pyriform sinuses with thick and puree texture that doesnt clear with dry swallows. This may be right sided pharyngeal residue, but AP view not available due to pts poor mobility to slide himself in the chair. Difficult to visualize pts trachea due to shadow of his shoulder, but pt also with late coughing with nectar/puree/honey that may indicate penetration or aspiration of pyriforms sinus residue. Definitive aspiration of residue WITHOUT cough seen with thin liquids at the end of study. At this juncture,  safe and adequate oral intake seems less likely in the short term. Pt may be able to initiate careful trials of nectar teaspoon and purees with full supervision, but risk of aspiration is higher than prior. Pt may benefit from discussion with palliative care about long term method of nutrition if intensive rehabilition is desired. If a more comfort approach is chosen, would recommend liberalizing pts diet with known risk. Factors that may increase risk of adverse event in presence of aspiration [name] & [name] 2021): Limited mobility;Inadequate oral hygiene;Dependence for feeding and/or oral hygiene;Aspiration of thick, dense, and/or acidic materials;Weak cough;Reduced cognitive function  Swallow Evaluation Recommendations Recommendations: NPO      [name], [name] 10/17/2022,11:24 AM

## 2022-10-17 NOTE — Progress Notes (Signed)
[facility]   Reason for visit: follow up on Staph epidermidis bacteremia  Interval History: no acute events.   Day 9 total antibiotics  Physical Exam: Constitutional:  Vitals:   10/17/22 0748 10/17/22 1134  BP: 97/74 116/62  Pulse: 74 77  Resp: 18 18  Temp: 98.2 F (36.8 C) 98.1 F (36.7 C)  SpO2: 100% 96%  Patient is in nad Respiratory: normal respiratory effort  Review of Systems: Unable to be assessed due to mental status  Lab Results  Component Value Date   WBC 8.5 10/17/2022   HGB 15.2 10/17/2022   HCT 43.9 10/17/2022   MCV 85.2 10/17/2022   PLT 233 10/17/2022    Lab Results  Component Value Date   CREATININE 0.88 10/17/2022   BUN 31 (H) 10/17/2022   NA 128 (L) 10/17/2022   K 4.5 10/17/2022   CL 93 (L) 10/17/2022   CO2 24 10/17/2022    Lab Results  Component Value Date   ALT 24 10/09/2022   AST 36 10/09/2022   ALKPHOS 82 10/09/2022     Microbiology: Recent Results (from the past 240 hour(s))  Culture, blood (Routine X 2) w Reflex to ID Panel     Status: Abnormal   Collection Time: 10/09/22  1:10 PM   Specimen: BLOOD RIGHT ARM  Result Value Ref Range Status   Specimen Description BLOOD RIGHT ARM  Final   Special Requests   Final    BOTTLES DRAWN AEROBIC AND ANAEROBIC Blood Culture results may not be optimal due to an excessive volume of blood received in culture bottles   Culture  Setup Time   Final    GRAM POSITIVE COCCI BOTTLES DRAWN AEROBIC ONLY PREVIOUSLY REPORTED AS: GRAM NEGATIVE RODS CORRECTED RESULTS CALLED TO: PHARMD J. [facility] [id number] @ 1921 FH Performed at [facility], [address], [place], [place] [address]    Culture STAPHYLOCOCCUS EPIDERMIDIS (A)  Final   Report Status 10/13/2022 FINAL  Final   Organism ID, Bacteria STAPHYLOCOCCUS EPIDERMIDIS  Final      Susceptibility   Staphylococcus epidermidis - MIC*    CIPROFLOXACIN <=0.5 SENSITIVE Sensitive     ERYTHROMYCIN <=0.25 SENSITIVE Sensitive      GENTAMICIN <=0.5 SENSITIVE Sensitive     OXACILLIN <=0.25 SENSITIVE Sensitive     TETRACYCLINE <=1 SENSITIVE Sensitive     VANCOMYCIN 2 SENSITIVE Sensitive     TRIMETH/SULFA <=10 SENSITIVE Sensitive     CLINDAMYCIN <=0.25 SENSITIVE Sensitive     RIFAMPIN <=0.5 SENSITIVE Sensitive     Inducible Clindamycin NEGATIVE Sensitive     * STAPHYLOCOCCUS EPIDERMIDIS  Culture, blood (Routine X 2) w Reflex to ID Panel     Status: Abnormal   Collection Time: 10/09/22  1:10 PM   Specimen: BLOOD LEFT ARM  Result Value Ref Range Status   Specimen Description BLOOD LEFT ARM  Final   Special Requests   Final    BOTTLES DRAWN AEROBIC AND ANAEROBIC Blood Culture results may not be optimal due to an excessive volume of blood received in culture bottles   Culture  Setup Time   Final    GRAM POSITIVE COCCI ANAEROBIC BOTTLE ONLY CRITICAL RESULT CALLED TO, READ BACK BY AND VERIFIED WITH: PHARMD ELIZABETH M 1052 0626 FCP Performed at [facility], [address] [place], [place] [address]    Culture STAPHYLOCOCCUS EPIDERMIDIS (A)  Final   Report Status 10/13/2022 FINAL  Final   Organism ID, Bacteria  STAPHYLOCOCCUS EPIDERMIDIS  Final      Susceptibility   Staphylococcus epidermidis - MIC*    CIPROFLOXACIN <=0.5 SENSITIVE Sensitive     ERYTHROMYCIN <=0.25 SENSITIVE Sensitive     GENTAMICIN <=0.5 SENSITIVE Sensitive     OXACILLIN <=0.25 SENSITIVE Sensitive     TETRACYCLINE 2 SENSITIVE Sensitive     VANCOMYCIN 2 SENSITIVE Sensitive     TRIMETH/SULFA <=10 SENSITIVE Sensitive     CLINDAMYCIN <=0.25 SENSITIVE Sensitive     RIFAMPIN <=0.5 SENSITIVE Sensitive     Inducible Clindamycin NEGATIVE Sensitive     * STAPHYLOCOCCUS EPIDERMIDIS  Blood Culture ID Panel (Reflexed)     Status: Abnormal   Collection Time: 10/09/22  1:10 PM  Result Value Ref Range Status   Enterococcus faecalis NOT DETECTED NOT DETECTED Final   Enterococcus Faecium NOT DETECTED NOT DETECTED Final   Listeria monocytogenes NOT  DETECTED NOT DETECTED Final   Staphylococcus species DETECTED (A) NOT DETECTED Final    Comment: CRITICAL RESULT CALLED TO, READ BACK BY AND VERIFIED WITH: PHARMD ELIZABETH M 1052 0626 FCP    Staphylococcus aureus (BCID) NOT DETECTED NOT DETECTED Final   Staphylococcus epidermidis DETECTED (A) NOT DETECTED Final    Comment: CRITICAL RESULT CALLED TO, READ BACK BY AND VERIFIED WITH: PHARMD ELIZABETH M 1052 0626 FCP    Staphylococcus lugdunensis NOT DETECTED NOT DETECTED Final   Streptococcus species NOT DETECTED NOT DETECTED Final   Streptococcus agalactiae NOT DETECTED NOT DETECTED Final   Streptococcus pneumoniae NOT DETECTED NOT DETECTED Final   Streptococcus pyogenes NOT DETECTED NOT DETECTED Final   A.calcoaceticus-baumannii NOT DETECTED NOT DETECTED Final   Bacteroides fragilis NOT DETECTED NOT DETECTED Final   Enterobacterales NOT DETECTED NOT DETECTED Final   Enterobacter cloacae complex NOT DETECTED NOT DETECTED Final   Escherichia coli NOT DETECTED NOT DETECTED Final   Klebsiella aerogenes NOT DETECTED NOT DETECTED Final   Klebsiella oxytoca NOT DETECTED NOT DETECTED Final   Klebsiella pneumoniae NOT DETECTED NOT DETECTED Final   Proteus species NOT DETECTED NOT DETECTED Final   Salmonella species NOT DETECTED NOT DETECTED Final   Serratia marcescens NOT DETECTED NOT DETECTED Final   Haemophilus influenzae NOT DETECTED NOT DETECTED Final   Neisseria meningitidis NOT DETECTED NOT DETECTED Final   Pseudomonas aeruginosa NOT DETECTED NOT DETECTED Final   Stenotrophomonas maltophilia NOT DETECTED NOT DETECTED Final   Candida albicans NOT DETECTED NOT DETECTED Final   Candida auris NOT DETECTED NOT DETECTED Final   Candida glabrata NOT DETECTED NOT DETECTED Final   Candida krusei NOT DETECTED NOT DETECTED Final   Candida parapsilosis NOT DETECTED NOT DETECTED Final   Candida tropicalis NOT DETECTED NOT DETECTED Final   Cryptococcus neoformans/gattii NOT DETECTED NOT DETECTED  Final   Methicillin resistance mecA/C NOT DETECTED NOT DETECTED Final    Comment: Performed at [facility], [address] [place], [place] [address]  Culture, blood (Routine X 2) w Reflex to ID Panel     Status: None   Collection Time: 10/11/22 11:50 AM   Specimen: BLOOD RIGHT HAND  Result Value Ref Range Status   Specimen Description BLOOD RIGHT HAND  Final   Special Requests   Final    BOTTLES DRAWN AEROBIC AND ANAEROBIC Blood Culture adequate volume   Culture   Final    NO GROWTH 5 DAYS Performed at [facility], [address], [place], [place] [address]    Report Status 10/16/2022 FINAL  Final  Culture, blood (Routine X  2) w Reflex to ID Panel     Status: None   Collection Time: 10/11/22 11:51 AM   Specimen: BLOOD RIGHT HAND  Result Value Ref Range Status   Specimen Description BLOOD RIGHT HAND  Final   Special Requests   Final    BOTTLES DRAWN AEROBIC AND ANAEROBIC Blood Culture adequate volume   Culture   Final    NO GROWTH 5 DAYS Performed at [facility], [address], [place], [place] [address]    Report Status 10/16/2022 FINAL  Final    Impression/Plan:  1. Bacteremia with Staph epidermidis in one bottle each of two sets - positive in the setting of ICD.  Repeat blood cultures have remained negative.  No new fever, no leukocytosis.   Will continue with cefazolin  2.  ICD - in setting of bacteremia, TEE when able.     I have personally spent 50 minutes involved in face-to-face and non-face-to-face activities for this patient on the day of the visit. Professional time spent includes the following activities: Preparing to see the patient (review of tests), Obtaining and/or reviewing separately obtained history (admission/discharge record), Performing a medically appropriate examination and/or evaluation , Ordering medications/tests/procedures, referring and communicating with other health care professionals, Documenting clinical information in the  EMR, Independently interpreting results (not separately reported), Communicating results to the patient/family/caregiver, Counseling and educating the patient/family/caregiver and Care coordination (not separately reported).    Dr. Daiva Eves available over the weekend if needed, otherwise ID team will follow up on MOnday

## 2022-10-18 DIAGNOSIS — I639 Cerebral infarction, unspecified: Secondary | ICD-10-CM | POA: Diagnosis not present

## 2022-10-18 DIAGNOSIS — R7881 Bacteremia: Secondary | ICD-10-CM | POA: Diagnosis not present

## 2022-10-18 DIAGNOSIS — I6300 Cerebral infarction due to thrombosis of unspecified precerebral artery: Secondary | ICD-10-CM | POA: Diagnosis not present

## 2022-10-18 DIAGNOSIS — R1310 Dysphagia, unspecified: Secondary | ICD-10-CM | POA: Diagnosis not present

## 2022-10-18 DIAGNOSIS — Z7189 Other specified counseling: Secondary | ICD-10-CM | POA: Diagnosis not present

## 2022-10-18 LAB — GLUCOSE, CAPILLARY
Glucose-Capillary: 179 mg/dL — ABNORMAL HIGH (ref 70–99)
Glucose-Capillary: 197 mg/dL — ABNORMAL HIGH (ref 70–99)
Glucose-Capillary: 175 mg/dL — ABNORMAL HIGH (ref 70–99)
Glucose-Capillary: 175 mg/dL — ABNORMAL HIGH (ref 70–99)
Glucose-Capillary: 224 mg/dL — ABNORMAL HIGH (ref 70–99)

## 2022-10-18 LAB — CBC
HCT: 44.7 % (ref 39.0–52.0)
Hemoglobin: 15 g/dL (ref 13.0–17.0)
MCH: 29 pg (ref 26.0–34.0)
MCHC: 33.6 g/dL (ref 30.0–36.0)
MCV: 86.3 fL (ref 80.0–100.0)
Platelets: 240 10*3/uL (ref 150–400)
RBC: 5.18 MIL/uL (ref 4.22–5.81)
RDW: 12.9 % (ref 11.5–15.5)
WBC: 7.8 10*3/uL (ref 4.0–10.5)
nRBC: 0 % (ref 0.0–0.2)

## 2022-10-18 LAB — HEPARIN LEVEL (UNFRACTIONATED)
Heparin Unfractionated: 0.55 IU/mL (ref 0.30–0.70)
Heparin Unfractionated: 0.6 IU/mL (ref 0.30–0.70)

## 2022-10-18 MED ORDER — HYDRALAZINE HCL 20 MG/ML IJ SOLN
INTRAMUSCULAR | Status: AC
  Filled 2022-10-18: qty 1

## 2022-10-18 NOTE — Progress Notes (Signed)
ANTICOAGULATION CONSULT NOTE - Follow Up Consult  Pharmacy Consult for heparin Indication:  pseudoaneurysm  No Known Allergies  Patient Measurements: Height: 6\' 1"  (185.4 cm) Weight: 101.2 kg (223 lb 1.7 oz) IBW/kg (Calculated) : 79.9 Heparin Dosing Weight: 101 kg  Vital Signs: Temp: 98.4 F (36.9 C) (07/04 1620) Temp Source: Oral (07/04 1620) BP: 95/70 (07/04 1620) Pulse Rate: 95 (07/04 1620)  Labs: Recent Labs    10/16/22 0453 10/17/22 0439 10/18/22 0221 10/18/22 1901  HGB 14.5 15.2 15.0  --   HCT 42.9 43.9 44.7  --   PLT 195 233 240  --   HEPARINUNFRC 0.39 0.44 0.55 0.60  CREATININE 0.89 0.88  --   --      Estimated Creatinine Clearance: 110.2 mL/min (by C-G formula based on SCr of 0.88 mg/dL).   Medications:  Scheduled:   atorvastatin  80 mg Per Tube Daily   carvedilol  12.5 mg Per Tube BID WC   Chlorhexidine Gluconate Cloth  6 each Topical Daily   feeding supplement (PROSource TF20)  60 mL Per Tube Daily   fiber supplement (BANATROL TF)  60 mL Per Tube BID   furosemide  40 mg Per Tube BID   gabapentin  300 mg Per Tube QHS   insulin aspart  0-20 Units Subcutaneous Q4H   multivitamin with minerals  1 tablet Per Tube Daily   nicotine  7 mg Transdermal Daily   pantoprazole (PROTONIX) IV  40 mg Intravenous QHS   polyethylene glycol  17 g Per Tube BID   sacubitril-valsartan  1 tablet Per Tube BID   spironolactone  12.5 mg Per Tube Daily   Infusions:   sodium chloride Stopped (10/16/22 1641)    ceFAZolin (ANCEF) IV 2 g (10/18/22 1346)   feeding supplement (OSMOLITE 1.5 CAL) 1,000 mL (10/17/22 1641)   heparin 1,150 Units/hr (10/18/22 1458)    Assessment: 62 YOM with hx LV pseudoaneurysm s/p patch now with large thrombus burden, admitted with ischemic stroke with hemorrhagic conversion, CT stable today and pharmacy consulted to cautiously start heparin gtt for LV pseudoaneurysm, no bolus with low end goals.     Heparin level now 0.6  Goal of Therapy:   Heparin level 0.3-0.5 units/ml Monitor platelets by anticoagulation protocol: Yes   Plan:  Decrease heparin infusion to 1000 units/hr Daily Heparin level and CBC Monitor for signs/symptoms of bleeding  Thank you Okey Regal, PharmD 10/18/2022 7:43 PM  **Pharmacist phone directory can be found on [email or web address] listed under [facility]**

## 2022-10-18 NOTE — Progress Notes (Signed)
Physical Therapy Treatment Patient Details Name: [name] MRN: [id number] DOB: 12-18-1960 Today's Date: 10/18/2022   History of Present Illness Patient is a 62 y/o male admitted 10/03/22 with L weakness and numbness.  Received TNK then developed now symptoms of aphasia and R weakness.  He underwent mechanical thrombectomy L M3 segment MCA noted to have small hemorrhage and follow up CT showed progression of hemorrhage 3cm then 7cm and small acute R cerebellar infarct. PMH positive for CAD s/p PCI w/stent and 1v CABG, multiple CGAs, systolic HF s/p ICD, HLD, HTN.    PT Comments  Modest progress but willing to participate with rehab. Focused on rolling in bed (mod assist towards right side, utilizing RUE on rail with guidance through neglected field. Held rail well while repositioning and fastening hoyer pad. Sat EOB up to 10 min while working on seated balance and midline orientation. Used LUE on bed, in lap, and to hold horizontal bar of Stedy. Unable to fully stand with stedy yet (total assist +2). OOB to recliner with maximove. Repositioned for comfort in chair. Patient will continue to benefit from skilled physical therapy services to further improve independence with functional mobility.      Assistance Recommended at Discharge Frequent or constant Supervision/Assistance  If plan is discharge home, recommend the following:  Can travel by private vehicle    Two people to help with walking and/or transfers;A lot of help with bathing/dressing/bathroom;Assistance with cooking/housework;Direct supervision/assist for medications management;Assist for transportation;Help with stairs or ramp for entrance;Assistance with feeding   No  Equipment Recommendations  Other (comment) (TBA)    Recommendations for Other Services       Precautions / Restrictions Precautions Precautions: Fall Precaution Comments: R inattention; SBP <160 Restrictions Weight Bearing Restrictions: No      Mobility  Bed Mobility Overal bed mobility: Needs Assistance Bed Mobility: Rolling, Sidelying to Sit, Sit to Sidelying Rolling: Mod assist Sidelying to sit: Max assist, +2 for safety/equipment     Sit to sidelying: Max assist, +2 for physical assistance General bed mobility comments: Mod assist to roll, utilzing LUE to reach towards right with hands on assist to facilitate awareness towards Rt side. Max A +2 for safety with sidelying to sit transition for trunk and LE support. Facilitates LLE but needs assist for sequencing. Max A +2 for physical assist to lower back to sidelying with cues. Trunk control and LE support bil.    Transfers Overall transfer level: Needs assistance Equipment used: Ambulation equipment used Transfers: Sit to/from Stand Sit to Stand: Total assist, +2 physical assistance, +2 safety/equipment, From elevated surface           General transfer comment: Initiated attempts to stand with Stedy. Unsuccessful to rise fully. Able to progress to light clearance from bed but not sustained with +2 total assist. Pt does hold horiziontal bar on stedy with LUE. Max cues for technique. Utilized maximove to transfer to [employer] Transfer via Lift Equipment: Dellia Cloud  Ambulation/Gait               General Gait Details: unable   Stairs             Wheelchair Mobility     Tilt Bed    Modified Rankin (Stroke Patients Only) Modified Rankin (Stroke Patients Only) Pre-Morbid Rankin Score: Moderate disability Modified Rankin: Severe disability     Balance Overall balance assessment: Needs assistance Sitting-balance support: Feet supported, Single extremity supported Sitting balance-Leahy Scale: Poor Sitting balance - Comments: Leaning Rt,  intermittent pushing Rt with LUE. Made progress to min assist to told in midline. Able to pull self to midline a couple of times. Worked on this sitting EOB for approx 10 min. Postural control: Right lateral  lean     Standing balance comment: Unable to stand upright.                            Cognition Arousal/Alertness: Awake/alert Behavior During Therapy: Flat affect Overall Cognitive Status: Difficult to assess                                 General Comments: Will follow simple commands inconsistently, reaching with LUE, holding Rt hand briefly with Lt hand. Still with strong neglect towards Rt.        Exercises General Exercises - Lower Extremity Ankle Circles/Pumps: AAROM, Both, 10 reps, Supine Other Exercises Other Exercises: Seated balance exercises, weight shift, midline orientation, pulling self back to midline.    General Comments        Pertinent Vitals/Pain Pain Assessment Pain Assessment: CPOT Faces Pain Scale: No hurt Facial Expression: Grimacing (When LEs lifted) Body Movements: Absence of movements Muscle Tension: Relaxed Compliance with ventilator (intubated pts.): N/A Vocalization (extubated pts.): Sighing, moaning CPOT Total: 3 Pain Location: Guarding when lifting legs (likely Hamstring tightness.) Pain Descriptors / Indicators: Grimacing, Guarding Pain Intervention(s): Monitored during session, Repositioned    Home Living                          Prior Function            PT Goals (current goals can now be found in the care plan section) Acute Rehab PT Goals Patient Stated Goal: to return home PT Goal Formulation: With patient/family Time For Goal Achievement: 11/01/22 Potential to Achieve Goals: Fair Progress towards PT goals: Progressing toward goals    Frequency    Min 3X/week      PT Plan Current plan remains appropriate    Co-evaluation              AM-PAC PT "6 Clicks" Mobility   Outcome Measure  Help needed turning from your back to your side while in a flat bed without using bedrails?: A Lot Help needed moving from lying on your back to sitting on the side of a flat bed without  using bedrails?: Total Help needed moving to and from a bed to a chair (including a wheelchair)?: Total Help needed standing up from a chair using your arms (e.g., wheelchair or bedside chair)?: Total Help needed to walk in hospital room?: Total Help needed climbing 3-5 steps with a railing? : Total 6 Click Score: 7    End of Session Equipment Utilized During Treatment: Gait belt;Other (comment) (Rt wrist/hand splint in place.) Activity Tolerance: Patient tolerated treatment well Patient left: with call bell/phone within reach;in chair;with chair alarm set;with SCD's reapplied (Lt mitt in place at start and finish of therapy. Removed during session.) Nurse Communication: Mobility status;Need for lift equipment PT Visit Diagnosis: Other abnormalities of gait and mobility (R26.89);Other symptoms and signs involving the nervous system (R29.898);Hemiplegia and hemiparesis Hemiplegia - Right/Left: Right Hemiplegia - dominant/non-dominant: Dominant Hemiplegia - caused by: Cerebral infarction;Other Nontraumatic intracranial hemorrhage     Time: 1610-9604 PT Time Calculation (min) (ACUTE ONLY): 45 min  Charges:    $Therapeutic Activity: 23-37  mins $Neuromuscular Re-education: 8-22 mins PT General Charges $$ ACUTE PT VISIT: 1 Visit                     Kathlyn Sacramento, PT, DPT [facility]  Rehabilitation Services Physical Therapist Office: [phone] Website: [email or web address]    Berton Mount 10/18/2022, 11:49 AM

## 2022-10-18 NOTE — Progress Notes (Signed)
[facility]                                                                              [name], is a 62 y.o. male, DOB - [birth year], ZOX:[id number] Admit date - 10/02/2022    Outpatient Primary MD for the patient is [name], MD  LOS - 16  days  Chief Complaint  Patient presents with   Code Stroke       Brief summary   Patient is a 62 year old male with tobacco use,CAD s/p CABG, HFrEF/ HFpEF, ICM w/ICD (MRI compatible), myopericarditis/ dresslers syndrome (2014), HTN, HLD, and multiple CVAs initially presented on 6/18 with left hemiplegia, dysarthria, dizziness, and facial droop. Initial CTH negative, treated w/TNK but subsequently later developed right sided deficits found to have left M3 occlusion s/p NIR on 6/19 w/ successful thrombectomy, however complicated by IPH (left frontoparietal) and SAH.  Developed fever 6/19 without leukocytosis with initial negative CXR, UA, and PCT. Ceftriaxone empirically started 6/25. Found to have large inferior, thrombus filled, pseudoaneurysm with EF 20-25% (prior 25-30% 2022) on TTE with concern for dehiscence of prior pseudoaneurysm patch in 2014 and source of strokes to be embolic. Cardiology consulted 6/23, anticoagulation recommended but deferred given recent IPH and SAH.  On 6/26, blood cultures positive for MSSE. ID consulted 6/27, ceftriaxone switched to cefazolin. Patient continued to have fevers despite abx with increasing leukocytosis on 6/28.  EP consulted for possible ICD extraction given bacteremia but not a candidate at this time.  Heparin gtt started 6/28. Repeat blood cultures 6/27 remain NGTD x 3 days. CTH remains stable 6/29 without neuro changes.  Patient transferred out of neuro ICU, TRH assumed care on 7/3.    Assessment & Plan    Right brain stroke aborted s/p TNK and later that day second stroke with left M2 occlusion s/p IR Left MCA and right cerebellar infarcts left M3 occlusion s/p IR  reperfusion complicated by L frontoparietal ICH and SAH, etiology likely cardioembolic source  -CT 6/18 on presentation no acute finding, subsequent bilateral frontal, parietal, occipital and cerebellum infarcts -CTA head and neck unremarkable.  Repeat CTA head and neck showed news short segment occlusion of distal left M2 status post IR reperfusion complicated by left frontoparietal ICH and SAH -Serial CT head repeated.  CT head 6/28 showed stable hematoma, SAH and IVH have progressed since 10/03/2022 -2D echo showed Large inferior pseudoaneurysm with a depth of 3 cm and a maximum width of roughly 5 cm the wall of the pseudoaneurysm is made above the layer of thrombus that is roughly 1.5 cm thick. EF 30 to 35%, severe hypokinesis of left ventricle. -Lower extremity venous Dopplers negative for DVT -TCD bubble study showed moderate-sized PFO -LDL 63, hemoglobin A1c 5.8 -Prior to admission aspirin 81 mg daily and Plavix 75 mg daily, now on IV heparin per stroke protocol -Management per stroke service, may be a candidate for LTAC -N.p.o. 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 -Per ID, TEE when able to assess in light of bacteremia associated with fever -Repeat blood cultures remain negative, no new fevers or leukocytosis, continue IV cefazolin    Acute on chronic systolic CHF exacerbation -Goal-directed medical therapy for his heart failure per cardiology team -2D echo 6/23 showed EF 30 to 35%, severe global hypokinesis and wall motion abnormality.   -Patient was on IV Lasix/albumin, now transitioned to Lasix via cortrack     Acute hypoxemic respiratory failure -combination of fluid overload and poor pulmonary toileting.  -Improving, currently on room air    HTN, HLD --Continue statin, carvedilol, lasix, metoprolol, entresto, and spironolactone      Dysphagia -continue TF with Osmolite at 3ml/hr and PROsource 60ml daily.  -Appreciate SLP recs, repeat evaluation on 7/3 with worsening swallowing function, continue n.p.o. -palliative medicine consult for GOC, currently receiving tube feeds, ?  PEG tube  Estimated body mass index is 29.44 kg/m as calculated from the following:   Height as of this encounter: 6\' 1"  (1.854 m).   Weight as of this encounter: 101.2 kg.  Code Status: Full code DVT Prophylaxis:  SCDs  Level of Care: Level of care: Telemetry Medical Family Communication: No family at the bedside Disposition Plan:      Remains inpatient appropriate: May be a candidate for LTAC  Antimicrobials:   Anti-infectives (From admission, onward)    Start     Dose/Rate Route Frequency Ordered Stop   10/11/22 1515  cefTRIAXone (ROCEPHIN) 2 g in sodium chloride 0.9 % 100 mL IVPB  Status:  Discontinued        2 g 200 mL/hr over 30 Minutes Intravenous Every 24 hours 10/10/22 1934 10/10/22 1939   10/11/22 1515  cefTRIAXone (ROCEPHIN) 2 g in sodium chloride 0.9 % 100 mL IVPB  Status:  Discontinued        2 g 200 mL/hr over 30 Minutes Intravenous Every 24 hours 10/10/22 1939 10/11/22 0857   10/11/22 0945  ceFAZolin (ANCEF) IVPB 2g/100 mL premix        2 g 200 mL/hr over 30 Minutes Intravenous Every 8 hours 10/11/22 0857     10/10/22 2030  cefTRIAXone (ROCEPHIN) 1 g in sodium chloride 0.9 % 100 mL IVPB  Status:  Discontinued        1 g 200 mL/hr over 30 Minutes Intravenous  Once 10/10/22 1934 10/10/22 1939   10/10/22 2030  cefTRIAXone (ROCEPHIN) 1 g in sodium chloride 0.9 % 100 mL IVPB        1 g 200 mL/hr over 30 Minutes  Intravenous  Once 10/10/22 1939 10/10/22 2158   10/09/22 1428  cefTRIAXone (ROCEPHIN) 1 g in sodium chloride 0.9 % 100 mL IVPB  Status:  Discontinued        1 g 200 mL/hr over 30 Minutes Intravenous Every 24 hours 10/09/22 1428 10/10/22 1934   10/09/22 1145  cefTRIAXone (ROCEPHIN) 1 g in sodium chloride 0.9 % 100 mL IVPB  Status:  Discontinued        1 g 200 mL/hr over 30 Minutes Intravenous Every 24 hours 10/09/22 1048 10/09/22 1428          Medications  atorvastatin  80 mg Per Tube Daily   carvedilol  12.5 mg Per Tube BID WC  Chlorhexidine Gluconate Cloth  6 each Topical Daily   feeding supplement (PROSource TF20)  60 mL Per Tube Daily   fiber supplement (BANATROL TF)  60 mL Per Tube BID   furosemide  40 mg Per Tube BID   gabapentin  300 mg Per Tube QHS   insulin aspart  0-20 Units Subcutaneous Q4H   multivitamin with minerals  1 tablet Per Tube Daily   nicotine  7 mg Transdermal Daily   pantoprazole (PROTONIX) IV  40 mg Intravenous QHS   polyethylene glycol  17 g Per Tube BID   sacubitril-valsartan  1 tablet Per Tube BID   spironolactone  12.5 mg Per Tube Daily      Subjective:   [name] was seen and examined today.  No acute issues overnight.  Failed swallow evaluation again on 7/3, receiving tube feeds via core track.  No chest pain, shortness of breath, nausea vomiting  Objective:   Vitals:   10/17/22 2335 10/18/22 0353 10/18/22 0733 10/18/22 1115  BP: 97/76 103/74 102/77 (!) 95/59  Pulse: 81 76 85 87  Resp: 18 16 20 20   Temp: 98.9 F (37.2 C) 98.8 F (37.1 C) 99 F (37.2 C) (!) 97.5 F (36.4 C)  TempSrc: Oral Oral Oral Oral  SpO2: 100% 98% 95% 98%  Weight:      Height:        Intake/Output Summary (Last 24 hours) at 10/18/2022 1332 Last data filed at 10/18/2022 0421 Gross per 24 hour  Intake 2105.87 ml  Output 775 ml  Net 1330.87 ml     Wt Readings from Last 3 Encounters:  10/16/22 101.2 kg  08/06/22 106.1 kg  07/23/22 105.7 kg    Physical Exam General: Awake, expressive aphasia, core track+ Cardiovascular: S1 S2 clear, RRR.  Respiratory: CTAB, no wheezing, rales or rhonchi Gastrointestinal: Soft, nontender, nondistended, NBS Ext: no pedal edema bilaterally Neuro: right-sided weakness   Data Reviewed:  I have personally reviewed following labs    CBC Lab Results  Component Value Date   WBC 7.8 10/18/2022   RBC 5.18 10/18/2022   HGB 15.0 10/18/2022   HCT 44.7 10/18/2022   MCV 86.3 10/18/2022   MCH 29.0 10/18/2022   PLT 240 10/18/2022   MCHC 33.6 10/18/2022   RDW 12.9 10/18/2022   LYMPHSABS 2.6 10/02/2022   MONOABS 0.7 10/02/2022   EOSABS 0.1 10/02/2022   BASOSABS 0.1 10/02/2022     Last metabolic panel Lab Results  Component Value Date   NA 128 (L) 10/17/2022   K 4.5 10/17/2022   CL 93 (L) 10/17/2022   CO2 24 10/17/2022   BUN 31 (H) 10/17/2022   CREATININE 0.88 10/17/2022   GLUCOSE 197 (H) 10/17/2022   GFRNONAA >60 10/17/2022   GFRAA 92 05/06/2020   CALCIUM 9.3 10/17/2022   PHOS 3.9 10/16/2022   PROT 6.5 10/09/2022   ALBUMIN 3.1 (L) 10/09/2022   LABGLOB 2.4 05/25/2021   AGRATIO 1.8 05/25/2021   BILITOT 1.9 (H) 10/09/2022   ALKPHOS 82 10/09/2022   AST 36 10/09/2022   ALT 24 10/09/2022   ANIONGAP 11 10/17/2022    CBG (last 3)  Recent Labs    10/18/22 0351 10/18/22 0801 10/18/22 1116  GLUCAP 179* 197* 175*      Coagulation Profile: No results for input(s): "INR", "PROTIME" in the last 168 hours.   Radiology Studies: I have personally reviewed the imaging studies  DG Swallowing Func-Speech Pathology  Result Date: 10/17/2022 Table formatting from the original result was  not included. Modified Barium Swallow Study Patient Details Name: WASIM GOODNOW MRN: [id number] Date of Birth: [birth year] Today's Date: 10/17/2022 HPI/PMH: HPI: Pt is a 62 y/o male who presented 6/19 with L weakness and numbness. Pt received TNK then developed new symptoms of aphasia and R weakness. He  underwent mechanical thrombectomy L M3 segment MCA 6/19. Pt noted to have small hemorrhage and follow up CT showed progression of hemorrhage 3cm then 7cm and small acute R cerebellar infarct. PMH: CAD s/p PCI w/stent and 1v CABG, multiple CGAs, systolic HF s/p ICD, HLD, HTN. Clinical Impression: Clinical Impression: This is pts second MBS during acute stay given a change in mental and respiratory status. Today, pts function is worse than prior MBS. Pt is alert, needs visual cues to participate, does not follow verbal commands due to aphasia (cannot swallow twice for example, tilt head or clear oral residue). Pt also with intermittent rapid shallow breathing, almost like exertion or hyperventilation that impacts control. Pt with decreased right labial and lingual tension (prior MBS incorrectly says left - unable to edit) and more significant right sided oral residue. Pts oral phase also more sluggish and slow, cannot sip from a straw or achieve consecutive swallows as seen in prior study. There is increased residue in the pyriform sinuses with thick and puree texture that doesnt clear with dry swallows. This may be right sided pharyngeal residue, but AP view not available due to pts poor mobility to slide himself in the chair. Difficult to visualize pts trachea due to shadow of his shoulder, but pt also with late coughing with nectar/puree/honey that may indicate penetration or aspiration of pyriforms sinus residue. Definitive aspiration of residue WITHOUT cough seen with thin liquids at the end of study. At this juncture, safe and adequate oral intake seems less likely in the short term. Pt may be able to initaite careful trials of nectar teaspoon and purees with full supervision, but risk of aspiration is higher than prior. Pt may benefit from discussion with palliative care about long term method of nutrition if intensive rehabilition is desired. If a more comfort approach is chosen, would recommend liberalizing  pts diet with known risk. Factors that may increase risk of adverse event in presence of aspiration [name] [employer] [name] 2021): Factors that may increase risk of adverse event in presence of aspiration [name] [employer] [name] 2021): Limited mobility; Inadequate oral hygiene; Dependence for feeding and/or oral hygiene; Aspiration of thick, dense, and/or acidic materials; Weak cough; Reduced cognitive function Recommendations/Plan: Swallowing Evaluation Recommendations Swallowing Evaluation Recommendations Recommendations: NPO Treatment Plan Treatment Plan Treatment recommendations: Therapy as outlined in treatment plan below Follow-up recommendations: Acute inpatient rehab (3 hours/day) Functional status assessment: Patient has had a recent decline in their functional status and demonstrates the ability to make significant improvements in function in a reasonable and predictable amount of time. Treatment frequency: Min 2x/week Treatment duration: 2 weeks Interventions: Aspiration precaution training Recommendations Recommendations for follow up therapy are one component of a multi-disciplinary discharge planning process, led by the attending physician.  Recommendations may be updated based on patient status, additional functional criteria and insurance authorization. Assessment: Orofacial Exam: Orofacial Exam Oral Cavity - Dentition: Edentulous Oral Motor/Sensory Function: Suspected cranial nerve impairment CN VII - Facial: Right motor impairment Anatomy: No data recorded Boluses Administered: Boluses Administered Boluses Administered: Thin liquids (Level 0); Mildly thick liquids (Level 2, nectar thick); Moderately thick liquids (Level 3, honey thick); Puree  Oral Impairment Domain: Oral Impairment Domain Lip Closure: Interlabial escape,  no progression to anterior lip Tongue control during bolus hold: Not tested Bolus preparation/mastication: -- (didnt test solids) Bolus transport/lingual motion: Repetitive/disorganized  tongue motion Oral residue: Residue collection on oral structures Location of oral residue : Floor of mouth; Lateral sulci Initiation of pharyngeal swallow : Pyriform sinuses  Pharyngeal Impairment Domain: Pharyngeal Impairment Domain Soft palate elevation: No bolus between soft palate (SP)/pharyngeal wall (PW) Laryngeal elevation: Partial superior movement of thyroid cartilage/partial approximation of arytenoids to epiglottic petiole Anterior hyoid excursion: Partial anterior movement Epiglottic movement: Complete inversion Laryngeal vestibule closure: Complete, no air/contrast in laryngeal vestibule Pharyngeal stripping wave : Present - complete Pharyngeal contraction (A/P view only): -- (suspected) Pharyngoesophageal segment opening: Complete distension and complete duration, no obstruction of flow Tongue base retraction: Trace column of contrast or air between tongue base and PPW Pharyngeal residue: Collection of residue within or on pharyngeal structures Location of pharyngeal residue: Pyriform sinuses  Esophageal Impairment Domain: No data recorded Pill: No data recorded Penetration/Aspiration Scale Score: Penetration/Aspiration Scale Score 1.  Material does not enter airway: Mildly thick liquids (Level 2, nectar thick); Moderately thick liquids (Level 3, honey thick); Puree 8.  Material enters airway, passes BELOW cords without attempt by patient to eject out (silent aspiration) : Thin liquids (Level 0) Compensatory Strategies: No data recorded  General Information: Caregiver present: No  Diet Prior to this Study: NPO; Cortrak/Small bore NG tube   Temperature : Febrile   Respiratory Status: Increased WOB; Tachypneic   Supplemental O2: None (Room air)   History of Recent Intubation: No  Behavior/Cognition: Alert; Requires cueing Self-Feeding Abilities: Needs assist with self-feeding Baseline vocal quality/speech: Not observed Volitional Cough: Unable to elicit Volitional Swallow: Unable to elicit No data  recorded Goal Planning: No data recorded No data recorded No data recorded No data recorded No data recorded Pain: Pain Assessment Pain Assessment: Faces Faces Pain Scale: 0 Facial Expression: 0 Body Movements: 0 Muscle Tension: 0 Compliance with ventilator (intubated pts.): N/A Vocalization (extubated pts.): 0 CPOT Total: 0 Pain Intervention(s): Monitored during session End of Session: Start Time:SLP Start Time (ACUTE ONLY): 0950 Stop Time: SLP Stop Time (ACUTE ONLY): 1015 Time Calculation:SLP Time Calculation (min) (ACUTE ONLY): 25 min Charges: SLP Evaluations $ SLP Speech Visit: 1 Visit SLP Evaluations $MBS Swallow: 1 Procedure $Swallowing Treatment: 1 Procedure SLP visit diagnosis: SLP Visit Diagnosis: Dysphagia, oropharyngeal phase (R13.12) Past Medical History: Past Medical History: Diagnosis Date  Abnormal EKG   hx of ischemia showing up on ekg's  Acute myopericarditis   a. 03/2013: readm with hypoxia, tachycardia, elevated ESR/CRP, elevated troponin with Dressler syndrome and myopericarditis; treated with steroids.  Acute respiratory failure (HCC)   a. 01/2013: VDRF. b. 03/2013: hypoxia requiring supp O2 during adm, resolved by discharge.  C. difficile colitis   a. 01/2013 during prolonged adm. b. Recurred 03/2013.  Cardiac tamponade   a. 01/2013 s/p drain.  Cataracts, bilateral   Coronary artery disease   a. s/p MI in 2009 in [place] with stenting of the LCX and LAD;  b. 12/2012 NSTEMI/CAD: LM nl, LAD patent prox stent, LCX 50-70 isr (FFR 0.84), RCA dom, 179m, EF 40-45%-->Med Rx. c. 01/2013: anterolateral STEMI complicated by pericardial effusion (presumed purulent pericarditis) and tamponade s/p drain, ruptured LV pseudoaneurysm s/p CorMatrix patch, CABGx1 (SVG-OM1), fever, CVA, VDRF, C diff.  CVA (cerebral infarction)   a. 01/2013 in setting of prolonged hospitalization - residual L arm weakness.  Dressler syndrome (HCC)   a. 03/2013: readm with hypoxia, tachycardia, elevated ESR/CRP, elevated troponin with  Dressler syndrome and myopericarditis; treated with steroids.  Glaucoma   Hyperlipidemia   Hypokalemia   Hyponatremia   a. During late 2014.  Ischemic cardiomyopathy   a. Sept/Oct 2014: EF ~40% (ICM). b. 03/2013: EF 25-30%. Off ACEI due to hypotension (MIXED NICM/ICM).  Optic neuropathy, ischemic   Pericardial effusion   a. 01/2013: pericardial effusion (presumed purulent pericarditis) and cardiac tamponade s/p drain. b. Persistent moderate pericardial effusion 03/2013.  Pre-diabetes   Pseudoaneurysm of left ventricle of heart   a. 01/2013:  Ruptured inferoposterior LV pseudoaneurysm s/p CorMatrix patch.  Sinus bradycardia   asymptomatic  Stroke [facility]) 2009  weak lt side-lt arm  Tobacco abuse   Wears dentures   top Past Surgical History: Past Surgical History: Procedure Laterality Date  CARDIAC CATHETERIZATION  01/06/2013  CATARACT EXTRACTION Bilateral 05/2021  COLONOSCOPY WITH PROPOFOL N/A 07/03/2016  Procedure: COLONOSCOPY WITH PROPOFOL;  Surgeon: Ruffin Frederick, MD;  Location: [facility];  Service: Gastroenterology;  Laterality: N/A;  CORONARY ANGIOPLASTY WITH STENT PLACEMENT  2000's X 2  "1 + 1" (01/06/2013)  CORONARY ARTERY BYPASS GRAFT N/A 01/28/2013  Procedure: CORONARY ARTERY BYPASS GRAFTING (CABG);  Surgeon: Loreli Slot, MD;  Location: [facility] OR;  Service: Open Heart Surgery;  Laterality: N/A;  CABG x one, using left greater saphenous vein harvested endoscopically  ICD IMPLANT N/A 05/25/2020  Procedure: ICD IMPLANT;  Surgeon: Hillis Range, MD;  Location: [facility] INVASIVE CV LAB;  Service: Cardiovascular;  Laterality: N/A;  INTRAOPERATIVE TRANSESOPHAGEAL ECHOCARDIOGRAM N/A 01/28/2013  Procedure: INTRAOPERATIVE TRANSESOPHAGEAL ECHOCARDIOGRAM;  Surgeon: Loreli Slot, MD;  Location: [facility] OR;  Service: Open Heart Surgery;  Laterality: N/A;  IR CT HEAD LTD  10/03/2022  IR PERCUTANEOUS ART THROMBECTOMY/INFUSION INTRACRANIAL INC DIAG ANGIO  10/03/2022  IR US GUIDE VASC ACCESS RIGHT  10/03/2022  LEFT  HEART CATH AND CORS/GRAFTS ANGIOGRAPHY N/A 09/13/2020  Procedure: LEFT HEART CATH AND CORS/GRAFTS ANGIOGRAPHY;  Surgeon: Swaziland, Peter M, MD;  Location: [facility] INVASIVE CV LAB;  Service: Cardiovascular;  Laterality: N/A;  LEFT HEART CATHETERIZATION WITH CORONARY ANGIOGRAM N/A 01/06/2013  Procedure: LEFT HEART CATHETERIZATION WITH CORONARY ANGIOGRAM;  Surgeon: Peter M Swaziland, MD;  Location: [facility] CATH LAB;  Service: Cardiovascular;  Laterality: N/A;  LEFT HEART CATHETERIZATION WITH CORONARY ANGIOGRAM N/A 01/21/2013  Procedure: LEFT HEART CATHETERIZATION WITH CORONARY ANGIOGRAM;  Surgeon: Kathleene Hazel, MD;  Location: [facility] CATH LAB;  Service: Cardiovascular;  Laterality: N/A;  LUMBAR LAMINECTOMY/ DECOMPRESSION WITH MET-RX Right 06/30/2018  Procedure: Right minimally invasive Lumbar Three-Four Far lateral discectomy;  Surgeon: Jadene Pierini, MD;  Location: [facility] OR;  Service: Neurosurgery;  Laterality: Right;  Right minimally invasive Lumbar Three-Four Far lateral discectomy  MASS EXCISION Right 07/21/2014  Procedure: EXCISION RIGHT NECK MASS;  Surgeon: Harriette Bouillon, MD;  Location: [facility];  Service: General;  Laterality: Right;  PERICARDIAL TAP N/A 01/24/2013  Procedure: PERICARDIAL TAP;  Surgeon: Micheline Chapman, MD;  Location: [facility] CATH LAB;  Service: Cardiovascular;  Laterality: N/A;  RADIOLOGY WITH ANESTHESIA N/A 10/03/2022  Procedure: IR WITH ANESTHESIA;  Surgeon: Julieanne Cotton, MD;  Location: [facility] OR;  Service: Radiology;  Laterality: N/A;  RIGHT HEART CATHETERIZATION Right 01/24/2013  Procedure: RIGHT HEART CATH;  Surgeon: Micheline Chapman, MD;  Location: [facility] CATH LAB;  Service: Cardiovascular;  Laterality: Right;  VENTRICULAR ANEURYSM RESECTION N/A 01/28/2013  Procedure: LEFT VENTRICULAR ANEURYSM REPAIR;  Surgeon: Loreli Slot, MD;  Location: [facility] OR;  Service: Open Heart Surgery;  Laterality: N/A; Claudine Mouton 10/17/2022, 11:23 AM  Thad Ranger M.D. [facility] 10/18/2022, 1:32 PM  Available via Epic secure chat 7am-7pm After 7 pm, please refer to night coverage provider listed on amion.

## 2022-10-18 NOTE — Progress Notes (Signed)
STROKE TEAM PROGRESS NOTE   INTERVAL HISTORY Patient remains hemodynamically stable with Tmax of 99.0..  WBC count remains normal his neurological exam is improved, and he is able to follow commands with left upper and bilateral lower extremities.  He is trying to speak but makes mostly guttural sounds which are difficult to understand Vitals:   10/17/22 2335 10/18/22 0353 10/18/22 0733 10/18/22 1115  BP: 97/76 103/74 102/77 (!) 95/59  Pulse: 81 76 85 87  Resp: 18 16 20 20   Temp: 98.9 F (37.2 C) 98.8 F (37.1 C) 99 F (37.2 C) (!) 97.5 F (36.4 C)  TempSrc: Oral Oral Oral Oral  SpO2: 100% 98% 95% 98%  Weight:      Height:       CBC:  Recent Labs  Lab 10/17/22 0439 10/18/22 0221  WBC 8.5 7.8  HGB 15.2 15.0  HCT 43.9 44.7  MCV 85.2 86.3  PLT 233 240   Basic Metabolic Panel:  Recent Labs  Lab 10/16/22 0453 10/16/22 1526 10/17/22 0439  NA 130*  --  128*  K 4.5  --  4.5  CL 89*  --  93*  CO2 26  --  24  GLUCOSE 157*  --  197*  BUN 26*  --  31*  CREATININE 0.89  --  0.88  CALCIUM 9.3  --  9.3  MG  --  2.8*  --   PHOS  --  3.9  --     IMAGING past 24 hours No results found.   Physical Exam  Constitutional: Appears well-developed and well-nourished.  Middle-aged African-American male Cardiovascular: Normal rate and regular rhythm.  Respiratory: Regular, unlabored respirations on supplemental O2 Skin: WDI  Neuro: Mental Status: Patient is awake, alert, speech is dysarthric with expressive aphasia. .Following commands briskly on the left side Cranial Nerves: II: Pupils are equal, round, and reactive to light.   III,IV, VI: EOMI without ptosis or diplopia.  VII: Mild right facial droop  VIII: Hearing is intact to voice XII: tongue is midline without atrophy or fasciculations.  Motor: Tone is normal. Bulk is normal.  Moves left upper and lower extremities to command, will does not move right upper extremity but moves right lower extremity to command      ASSESSMENT/PLAN [name] is a 62 y.o. male with history of CAD s/p PCI w/ stent to LCS and LAD, s/p 1v CABG, multiple CVAs (R cerebellar 04/2022),  systolic HF s/p ICD, HLD, HTN presenting with slurred speech and L sided weakness now s/p TNK and IR thrombectomy of L MCA (M3 segment) c/b  hemorrhage.   TIA - Right brain stroke aborted s/p TNK and later that day second stroke with left M2 occlusion s/p IR Stroke - Left MCA and right cerebellar infarcts left M3 occlusion s/p IR with TICIs, complicated by L frontoparietal ICH and SAH, etiology likely cardioembolic source  CT 6/18 no acute finding, but the bilateral frontal, parietal, occipital and cerebellum infarcts CTA head and neck unremarkable CTA head and neck repeat new short segment occlusion of distal left M2 S/p IR with left M3 TICIs reperfusion  post IR CT Hyperdensity within the left sylvian fissure may represent small subarachnoid hemorrhage versus contrast. CT Head: Progressed hemorrhage in L frontoparietal region w/ 3cm hematoma and regional SAH MRI  progressive L fronto parietal hematoma (~7cm), regional SAH, increased mass effect.  Likely underlying infarct. Sm R cerebellar infarct, acute. Numerous chornic cerebral and cerebellar infarcts Repeat CT 6/19 increase in  size of the left frontal/parietal hematoma. New extension into the left lateral ventricle. CT repeat 6/22 x 2 - New component of intraparenchymal hemorrhage in the posterior right hemisphere that measures approximately 2.5 x 1.4 cm. (On my review, more like right brain redistribution instead of new hemorrhage)  Repeat CT 6/23: Unchanged appearance of bilateral mixed intraparenchymal and subarachnoid hemorrhage. Trace rightward midline shift is unchanged. No hydrocephalus CT repeat 6/25 stable hemorrhage, unchanged CT Head 6/28-  stable hematoma. SAH and IVH have regressed since 10/03/2022.  Echo: Large inferior pseudoaneurysm with a depth of 3 cm and a maximum  width of roughly 5 cm the wall of the pseudoaneurysm is made above the layer of thrombus that is roughly 1.5 cm thick. EF 30 to 35%, severe hypokinesis of left ventricle. No afib with ICD interrogation LE venous doppler neg for DVT TCD bubble study showed moderate sized PFO LDL 63 HgbA1c 5.8 UDS neg VTE prophylaxis - SQH  aspirin 81 mg daily and clopidogrel 75 mg daily prior to admission, now on IV Heparin per stroke protocol Therapy recommendations:  SNF Disposition:  pending  CHF s/p ICD Systolic HF, s/p ICD 2/2 ICM.  Echo EF 30-35%  Trop 45 -> 43 -> 39 -> 35 CK 3714, CKMB 3.5 CXR 6/25 central vascular congestion and developing interstitial edema.  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  Cardiology on board, appreciate assistance  CTA of the heart intact chronic patch repair to inferior and inferior septal pseudoaneurysm, large burden of thrombus on top of patch repair, severe biventricular failure with D-shaped septum, occluded SVG to OM Heparin IV started 6/28 Ultimately patient will need to repeat undergo repeat pseudoaneurysm repair if he becomes healthy enough to tolerate surgery  Hyperlipidemia Home meds:  atorvastatin 80 LDL 63, goal < 70 Lipitor 80 resumed    Continue statin at discharge  Tobacco abuse Current smoker Smoking cessation counseling will be  provided Nicotine patch provided  Fever ? Aspiration pneumonia Tmax 101.6 -> 99.8-> afebrile->100.8 -> 102-> 101.7-> 101.1 -> 101-> 100.3-> 99.4 WBC 7.6->9.4->10.2->9.8->10.2-> 9.2 UA negative CXR central vascular congestion and developing interstitial edema.  CXR 6/26 similar appearance of atelectasis at both lung bases Procalc negative.  Blood culture 2/4 bottles staph epidermidis, ID consulted, appreciate recommendations Blood culture repeat pending -no growth x 3 days NT suction PRN Ceftriaxone -> ancef ID consulted EP on board - not candidate for ICD extraction at this time Cardiology felt pt not candidate for TEE now given respiratory distress, however they are willing to do it if he is intubated  Dysphagia  Vomiting on zofran PRN, improved Pt ate well 6/24, nocturnal TF d/c'ed -> fever with possible aspiration -> NPO and TF restarted on 6/25 6/27 having difficulty coordinating swallowing and respirations  SLP will need to re evaluate  PFO Known PFO in the past TCD bubble study showed a moderate-sized PFO LE venous Doppler negative for DVT  Not  an emergent issue at this time  Other stroke risk factors Obesity with BMI 32.23 CAD  Other Active Problems Thrombocytopenia, platelet 110->89-> 172-> 190-> stable CK 436 -> 3714 -> 610-> 490  Hospital day # 16  Patient's fever has come down and white count remains normal.  His neurological exam shows improvement.  Continue antibiotics as per ID team.  Speech therapy.  Recommend NPO.  Need to consider PEG tube soon.  Continue IV heparin for LV pseudoaneurysm and clot.  Appreciate medical hospitalist team as you may his care.  Perative care team has a meeting planned this morning with patient's significant other and family..  No family available at the bedside for discussion.  Greater than 50% time during this 25-minute visit was spent in counseling and coordination of care discussion with patient and care team and answering  questions.     Delia Heady, MD Medical Director [facility] Pager: [phone] 10/18/2022 2:16 PM

## 2022-10-18 NOTE — Progress Notes (Signed)
Patient ID: LOCHLAN WALTERSCHEID, male   DOB: 01-20-1961, 62 y.o.   MRN: [id number]    Progress Note from the Palliative Medicine Team at [facility]   Patient Name: THADDUS GIKAS        Date: 10/18/2022 DOB: Sep 03, 1960  Age: 62 y.o. MRN#: [id number] Attending Physician: Cathren Harsh, MD Primary Care Physician: [name], MD Admit Date: 10/02/2022   Extensive chart review has been completed prior to meeting with patient/family  including labs, vital signs, imaging, progress/consult notes, orders, medications and available advance directive documents.    62 y/o male who presented 6/19 with L weakness and numbness. Pt received TNK then developed new symptoms of aphasia and R weakness. He underwent mechanical thrombectomy L M3 segment MCA 6/19. Pt noted to have small hemorrhage and follow up CT showed progression of hemorrhage 3cm then 7cm and small acute R cerebellar infarct. PMH: CAD s/p PCI w/stent and 1v CABG, multiple CGAs, systolic HF s/p ICD, HLD, HTN.  Initial PMT consult 10-16-2022    Patient and family face treatment option decisions, advanced directive decisions and anticipatory care needs.   This NP assessed patient at the bedside as a follow up palliative medicine needs and emotional support.    He is out of bed to the chair. Communication is difficult, secondary to aphasia.  Mains n.p.o. secondary to dysphagia.  Bacteremia with staph-in the setting of ICD Poor surgical candidate  I spoke to [place]/SO of over 12 years by telephone to confirm tomorrow morning's family meeting.    Dorann Lodge  tells me plan is for 930 for family meeting.   Education offered on the seriousness of patient's current medical situation and the difficult decisions family face. Currently patient does not have decision-making capacity.  Education offered today regarding  the importance of continued conversation with family and the medical providers regarding overall plan of care and  treatment options,  ensuring decisions are within the context of the patients values and GOCs.  Questions and concerns addressed      Time: 35 minutes  Detailed review of medical records ( labs, imaging, vital signs), medically appropriate exam ( MS, skin, resp)   discussed with treatment team, counseling and education to patient, family, staff, documenting clinical information, medication management, coordination of care    [name] NP  Palliative Medicine Team Team Phone # [phone] Pager [phone]

## 2022-10-18 NOTE — Progress Notes (Signed)
ANTICOAGULATION CONSULT NOTE - Follow Up Consult  Pharmacy Consult for heparin Indication:  pseudoaneurysm  No Known Allergies  Patient Measurements: Height: 6\' 1"  (185.4 cm) Weight: 101.2 kg (223 lb 1.7 oz) IBW/kg (Calculated) : 79.9 Heparin Dosing Weight: 101 kg  Vital Signs: Temp: 97.5 F (36.4 C) (07/04 1115) Temp Source: Oral (07/04 1115) BP: 95/59 (07/04 1115) Pulse Rate: 87 (07/04 1115)  Labs: Recent Labs    10/16/22 0453 10/17/22 0439 10/18/22 0221  HGB 14.5 15.2 15.0  HCT 42.9 43.9 44.7  PLT 195 233 240  HEPARINUNFRC 0.39 0.44 0.55  CREATININE 0.89 0.88  --      Estimated Creatinine Clearance: 110.2 mL/min (by C-G formula based on SCr of 0.88 mg/dL).   Medications:  Scheduled:   atorvastatin  80 mg Per Tube Daily   carvedilol  12.5 mg Per Tube BID WC   Chlorhexidine Gluconate Cloth  6 each Topical Daily   feeding supplement (PROSource TF20)  60 mL Per Tube Daily   fiber supplement (BANATROL TF)  60 mL Per Tube BID   furosemide  40 mg Per Tube BID   gabapentin  300 mg Per Tube QHS   insulin aspart  0-20 Units Subcutaneous Q4H   multivitamin with minerals  1 tablet Per Tube Daily   nicotine  7 mg Transdermal Daily   pantoprazole (PROTONIX) IV  40 mg Intravenous QHS   polyethylene glycol  17 g Per Tube BID   sacubitril-valsartan  1 tablet Per Tube BID   spironolactone  12.5 mg Per Tube Daily   Infusions:   sodium chloride Stopped (10/16/22 1641)    ceFAZolin (ANCEF) IV 2 g (10/18/22 0531)   feeding supplement (OSMOLITE 1.5 CAL) 1,000 mL (10/17/22 1641)   heparin 1,200 Units/hr (10/17/22 1634)    Assessment: 62 YOM with hx LV pseudoaneurysm s/p patch now with large thrombus burden, admitted with ischemic stroke with hemorrhagic conversion, CT stable today and pharmacy consulted to cautiously start heparin gtt for LV pseudoaneurysm, no bolus with low end goals.     Heparin level slightly supratherapeutic with our low goal, at 0.55, on Heparin 1200  units/hr with CBC stable. No signs/symptoms of bleeding or issues running the drip reported  Goal of Therapy:  Heparin level 0.3-0.5 units/ml Monitor platelets by anticoagulation protocol: Yes   Plan:  Decrease heparin infusion to 1150 units/hr Daily Heparin level and CBC Monitor for signs/symptoms of bleeding   Thank you for allowing pharmacy to be a part of this patient's care.   Signe Colt, PharmD 10/18/2022 12:02 PM  **Pharmacist phone directory can be found on [email or web address] listed under [facility]**

## 2022-10-19 DIAGNOSIS — E785 Hyperlipidemia, unspecified: Secondary | ICD-10-CM

## 2022-10-19 DIAGNOSIS — R1310 Dysphagia, unspecified: Secondary | ICD-10-CM | POA: Diagnosis not present

## 2022-10-19 DIAGNOSIS — I639 Cerebral infarction, unspecified: Secondary | ICD-10-CM | POA: Diagnosis not present

## 2022-10-19 DIAGNOSIS — R7881 Bacteremia: Secondary | ICD-10-CM | POA: Diagnosis not present

## 2022-10-19 DIAGNOSIS — I6300 Cerebral infarction due to thrombosis of unspecified precerebral artery: Secondary | ICD-10-CM | POA: Diagnosis not present

## 2022-10-19 LAB — CBC
HCT: 43.3 % (ref 39.0–52.0)
Hemoglobin: 14.5 g/dL (ref 13.0–17.0)
MCH: 29.1 pg (ref 26.0–34.0)
MCHC: 33.5 g/dL (ref 30.0–36.0)
MCV: 86.9 fL (ref 80.0–100.0)
Platelets: 247 10*3/uL (ref 150–400)
RBC: 4.98 MIL/uL (ref 4.22–5.81)
RDW: 13 % (ref 11.5–15.5)
WBC: 8.4 10*3/uL (ref 4.0–10.5)
nRBC: 0 % (ref 0.0–0.2)

## 2022-10-19 LAB — GLUCOSE, CAPILLARY
Glucose-Capillary: 168 mg/dL — ABNORMAL HIGH (ref 70–99)
Glucose-Capillary: 170 mg/dL — ABNORMAL HIGH (ref 70–99)
Glucose-Capillary: 171 mg/dL — ABNORMAL HIGH (ref 70–99)
Glucose-Capillary: 176 mg/dL — ABNORMAL HIGH (ref 70–99)
Glucose-Capillary: 177 mg/dL — ABNORMAL HIGH (ref 70–99)
Glucose-Capillary: 183 mg/dL — ABNORMAL HIGH (ref 70–99)
Glucose-Capillary: 197 mg/dL — ABNORMAL HIGH (ref 70–99)

## 2022-10-19 LAB — HEPARIN LEVEL (UNFRACTIONATED)
Heparin Unfractionated: 0.63 IU/mL (ref 0.30–0.70)
Heparin Unfractionated: 0.3 IU/mL (ref 0.30–0.70)
Heparin Unfractionated: 0.26 IU/mL — ABNORMAL LOW (ref 0.30–0.70)

## 2022-10-19 NOTE — Progress Notes (Signed)
ANTICOAGULATION CONSULT NOTE - Follow Up Consult  Pharmacy Consult for heparin  Indication: pseudoaneurysm   No Known Allergies  Patient Measurements: Height: 6\' 1"  (185.4 cm) Weight: 101.2 kg (223 lb 1.7 oz) IBW/kg (Calculated) : 79.9 Heparin Dosing Weight: 101 kg   Vital Signs: Temp: 98.4 F (36.9 C) (07/05 1925) Temp Source: Oral (07/05 1925) BP: 115/75 (07/05 1925) Pulse Rate: 75 (07/05 1925)  Labs: Recent Labs    10/17/22 0439 10/18/22 0221 10/18/22 1901 10/19/22 0531 10/19/22 1414 10/19/22 2125  HGB 15.2 15.0  --  14.5  --   --   HCT 43.9 44.7  --  43.3  --   --   PLT 233 240  --  247  --   --   HEPARINUNFRC 0.44 0.55   < > 0.63 0.30 0.26*  CREATININE 0.88  --   --   --   --   --    < > = values in this interval not displayed.     Estimated Creatinine Clearance: 110.2 mL/min (by C-G formula based on SCr of 0.88 mg/dL).   Assessment: 26 YOM with hx LV pseudoaneurysm s/p patch now with large thrombus burden, admitted with ischemic stroke with hemorrhagic conversion, CT stable today and pharmacy consulted to cautiously start heparin gtt for LV pseudoaneurysm, no bolus with low end goals.     Heparin level now subtherapeutic on 800 units/hr.   Goal of Therapy:  Heparin level 0.3-0.5 units/ml Monitor platelets by anticoagulation protocol: Yes   Plan:  Increase heparin 900 units/hr.  Next heparin level in 6 hours.    [employer], Pharm.D., BCPS Clinical Pharmacist  **Pharmacist phone directory can be found on [email or web address] listed under [facility].  10/19/2022 10:30 PM

## 2022-10-19 NOTE — Progress Notes (Signed)
Patient ID: SHANTE DENTE, male   DOB: [birth year], 62 y.o.   MRN: [id number]    Progress Note from the Palliative Medicine Team at [facility]   Patient Name: [name]        Date: 10/19/2022 DOB: 11/22/1960  Age: 62 y.o. MRN#: [id number] Attending Physician: Cathren Harsh, MD Primary Care Physician: [name], MD Admit Date: 10/02/2022   Extensive chart review has been completed prior to meeting with patient/family  including labs, vital signs, imaging, progress/consult notes, orders, medications and available advance directive documents.    62 y/o male who presented 6/19 with L weakness and numbness. Pt received TNK then developed new symptoms of aphasia and R weakness. He underwent mechanical thrombectomy L M3 segment MCA 6/19. Pt noted to have small hemorrhage and follow up CT showed progression of hemorrhage 3cm then 7cm and small acute R cerebellar infarct. PMH: CAD s/p PCI w/stent and 1v CABG, multiple CGAs, systolic HF s/p ICD, HLD, HTN.  Initial PMT consult 10-16-2022  This NP assessed patient at the bedside as a follow up palliative medicine needs and emotional support and to meet with family as scheduled for ongoing conversation regarding treatment plan.  Patient is alert and tracks.  He is unable to follow simple commands.  Remains with core Trak secondary to severe dysphagia.  He attempts his speech, it is only guttural sounds, no meaningful communication.  I met with patient's son [name] and patient's significant other [name].    Education offered on patient's current medical situation; left MCA and right cerebellar infarcts, history of previous strokes, CHF status post ICD, large inferior cardiac pseudoaneurysm, dysphagia, possible aspiration pneumonia.  Education offered on significant increased risk of aspiration in a patient with dysphagia. Education offered on risks and benefits of artificial feeding specific to PEG  Detailed likely  trajectory into the future requiring artificial nutrition, long-term SNF care, ongoing increased risk of infections; upper respiratory, UTI, skin.   Education offered on the difference between a full medical support path versus a comfort path focusing on comfort and dignity, allowing for natural death  Questions and concerns addressed  Emotional support offered as family face the seriousness of patient's current medical situation and the difficult decisions they face.        [name] [name] is next of kin and main decision maker  Form introduced and Hard Choices left for review.   Education offered today regarding  the importance of continued conversation with family and the medical providers regarding overall plan of care and treatment options,  ensuring decisions are within the context of the patients values and GOCs.  Family needs time to process through the weekend before making decisions regarding next steps and level of care.  Family  meeting planned for Monday July 8  at 930 am.   Time: 90  minutes  Detailed review of medical records ( labs, imaging, vital signs), medically appropriate exam ( MS, skin, resp)   discussed with treatment team, counseling and education to patient, family, staff, documenting clinical information, medication management, coordination of care    [name] NP  Palliative Medicine Team Team Phone # [phone] Pager [phone]

## 2022-10-19 NOTE — Plan of Care (Signed)
  Problem: Self-Care: Goal: Ability to participate in self-care as condition permits will improve Outcome: Not Progressing Goal: Verbalization of feelings and concerns over difficulty with self-care will improve Outcome: Not Progressing   Problem: Self-Care: Goal: Verbalization of feelings and concerns over difficulty with self-care will improve Outcome: Not Progressing

## 2022-10-19 NOTE — Progress Notes (Signed)
ANTICOAGULATION CONSULT NOTE - Follow Up Consult  Pharmacy Consult for heparin  Indication: pseudoaneurysm   No Known Allergies  Patient Measurements: Height: 6\' 1"  (185.4 cm) Weight: 101.2 kg (223 lb 1.7 oz) IBW/kg (Calculated) : 79.9 Heparin Dosing Weight: 101 kg   Vital Signs: Temp: 98 F (36.7 C) (07/05 1538) Temp Source: Oral (07/05 1538) BP: 104/76 (07/05 1538) Pulse Rate: 82 (07/05 1538)  Labs: Recent Labs    10/17/22 0439 10/18/22 0221 10/18/22 1901 10/19/22 0531 10/19/22 1414  HGB 15.2 15.0  --  14.5  --   HCT 43.9 44.7  --  43.3  --   PLT 233 240  --  247  --   HEPARINUNFRC 0.44 0.55 0.60 0.63 0.30  CREATININE 0.88  --   --   --   --      Estimated Creatinine Clearance: 110.2 mL/min (by C-G formula based on SCr of 0.88 mg/dL).   Assessment: 40 YOM with hx LV pseudoaneurysm s/p patch now with large thrombus burden, admitted with ischemic stroke with hemorrhagic conversion, CT stable today and pharmacy consulted to cautiously start heparin gtt for LV pseudoaneurysm, no bolus with low end goals.     Heparin level at low end of therapeutic (0.3) on infusion at 800 units/hr.   Goal of Therapy:  Heparin level 0.3-0.5 units/ml Monitor platelets by anticoagulation protocol: Yes   Plan:  Continue heparin 800 units/hr.  F/u 6hr confirmatory heparin level  Christoper Fabian, PharmD, BCPS Please see [email or web address] for complete clinical pharmacist phone list 10/19/2022,4:41 PM

## 2022-10-19 NOTE — Progress Notes (Signed)
STROKE TEAM PROGRESS NOTE   INTERVAL HISTORY Patient remains hemodynamically stable with Tmax of 99.1.[name]  WBC count remains normal his neurological exam is improved, and he is able to follow commands with left upper and bilateral lower extremities.  He is trying to speak but makes mostly guttural sounds which are difficult to understand.  His significant other is at the bedside Vitals:   10/19/22 0414 10/19/22 0800 10/19/22 1100 10/19/22 1235  BP: 103/69 107/71  99/66  Pulse: 87 84    Resp:  19  18  Temp: 99.1 F (37.3 C) 98.7 F (37.1 C) 99.1 F (37.3 C)   TempSrc: Oral Oral Oral   SpO2: 96% 97%  98%  Weight:      Height:       CBC:  Recent Labs  Lab 10/18/22 0221 10/19/22 0531  WBC 7.8 8.4  HGB 15.0 14.5  HCT 44.7 43.3  MCV 86.3 86.9  PLT 240 247   Basic Metabolic Panel:  Recent Labs  Lab 10/16/22 0453 10/16/22 1526 10/17/22 0439  NA 130*  --  128*  K 4.5  --  4.5  CL 89*  --  93*  CO2 26  --  24  GLUCOSE 157*  --  197*  BUN 26*  --  31*  CREATININE 0.89  --  0.88  CALCIUM 9.3  --  9.3  MG  --  2.8*  --   PHOS  --  3.9  --     IMAGING past 24 hours No results found.   Physical Exam  Constitutional: Appears well-developed and well-nourished.  Middle-aged African-American male Cardiovascular: Normal rate and regular rhythm.  Respiratory: Regular, unlabored respirations on supplemental O2 Skin: WDI  Neuro: Mental Status: Patient is awake, alert, speech is dysarthric with expressive aphasia. .Following commands briskly on the left side Cranial Nerves: II: Pupils are equal, round, and reactive to light.   III,IV, VI: EOMI without ptosis or diplopia.  VII: Mild right facial droop  VIII: Hearing is intact to voice XII: tongue is midline without atrophy or fasciculations.  Motor: Tone is normal. Bulk is normal.  Moves left upper and lower extremities to command, will does not move right upper extremity but moves right lower extremity to command      ASSESSMENT/PLAN [name] is a 62 y.o. male with history of CAD s/p PCI w/ stent to LCS and LAD, s/p 1v CABG, multiple CVAs (R cerebellar 04/2022),  systolic HF s/p ICD, HLD, HTN presenting with slurred speech and L sided weakness now s/p TNK and IR thrombectomy of L MCA (M3 segment) c/b  hemorrhage.   TIA - Right brain stroke aborted s/p TNK and later that day second stroke with left M2 occlusion s/p IR Stroke - Left MCA and right cerebellar infarcts left M3 occlusion s/p IR with TICIs, complicated by L frontoparietal ICH and SAH, etiology likely cardioembolic source  CT 6/18 no acute finding, but the bilateral frontal, parietal, occipital and cerebellum infarcts CTA head and neck unremarkable CTA head and neck repeat new short segment occlusion of distal left M2 S/p IR with left M3 TICIs reperfusion  post IR CT Hyperdensity within the left sylvian fissure may represent small subarachnoid hemorrhage versus contrast. CT Head: Progressed hemorrhage in L frontoparietal region w/ 3cm hematoma and regional SAH MRI  progressive L fronto parietal hematoma (~7cm), regional SAH, increased mass effect.  Likely underlying infarct. Sm R cerebellar infarct, acute. Numerous chornic cerebral and cerebellar infarcts Repeat CT  6/19 increase in size of the left frontal/parietal hematoma. New extension into the left lateral ventricle. CT repeat 6/22 x 2 - New component of intraparenchymal hemorrhage in the posterior right hemisphere that measures approximately 2.5 x 1.4 cm. (On my review, more like right brain redistribution instead of new hemorrhage)  Repeat CT 6/23: Unchanged appearance of bilateral mixed intraparenchymal and subarachnoid hemorrhage. Trace rightward midline shift is unchanged. No hydrocephalus CT repeat 6/25 stable hemorrhage, unchanged CT Head 6/28-  stable hematoma. SAH and IVH have regressed since 10/03/2022.  Echo: Large inferior pseudoaneurysm with a depth of 3 cm and a maximum  width of roughly 5 cm the wall of the pseudoaneurysm is made above the layer of thrombus that is roughly 1.5 cm thick. EF 30 to 35%, severe hypokinesis of left ventricle. No afib with ICD interrogation LE venous doppler neg for DVT TCD bubble study showed moderate sized PFO LDL 63 HgbA1c 5.8 UDS neg VTE prophylaxis - SQH  aspirin 81 mg daily and clopidogrel 75 mg daily prior to admission, now on IV Heparin per stroke protocol Therapy recommendations:  SNF Disposition:  pending  CHF s/p ICD Systolic HF, s/p ICD 2/2 ICM.  Echo EF 30-35%  Trop 45 -> 43 -> 39 -> 35 CK 3714, CKMB 3.5 CXR 6/25 central vascular congestion and developing interstitial edema.  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  Cardiology on board, appreciate assistance  CTA of the heart intact chronic patch repair to inferior and inferior septal pseudoaneurysm, large burden of thrombus on top of patch repair, severe biventricular failure with D-shaped septum, occluded SVG to OM Heparin IV started 6/28 Ultimately patient will need to repeat undergo repeat pseudoaneurysm repair if he becomes healthy enough to tolerate surgery  Hyperlipidemia Home meds:  atorvastatin 80 LDL 63, goal < 70 Lipitor 80 resumed    Continue statin at discharge  Tobacco abuse Current smoker Smoking cessation counseling will be  provided Nicotine patch provided  Fever ? Aspiration pneumonia Tmax 101.6 -> 99.8-> afebrile->100.8 -> 102-> 101.7-> 101.1 -> 101-> 100.3-> 99.4 WBC 7.6->9.4->10.2->9.8->10.2-> 9.2 UA negative CXR central vascular congestion and developing interstitial edema.  CXR 6/26 similar appearance of atelectasis at both lung bases Procalc negative.  Blood culture 2/4 bottles staph epidermidis, ID consulted, appreciate recommendations Blood culture repeat pending -no growth x 3 days NT suction PRN Ceftriaxone -> ancef ID consulted EP on board - not candidate for ICD extraction at this time Cardiology felt pt not candidate for TEE now given respiratory distress, however they are willing to do it if he is intubated  Dysphagia  Vomiting on zofran PRN, improved Pt ate well 6/24, nocturnal TF d/c'ed -> fever with possible aspiration -> NPO and TF restarted on 6/25 6/27 having difficulty coordinating swallowing and respirations  SLP will need to re evaluate  PFO Known PFO in the past TCD bubble study showed a moderate-sized PFO LE venous Doppler negative for  DVT  Not an emergent issue at this time  Other stroke risk factors Obesity with BMI 32.23 CAD  Other Active Problems Thrombocytopenia, platelet 110->89-> 172-> 190-> stable CK 436 -> 3714 -> 610-> 490  Hospital day # 17  Patient's fever has come down and white count remains normal.  His neurological exam shows improvement.  Continue antibiotics as per ID team.  Speech therapy.  Recommend NPO.  Need to consider PEG tube soon.  Continue IV heparin for LV pseudoaneurysm and clot and may change to Eliquis after PEG tube.  Palliative care team is meeting with patient's son to finalize goals of care..  Discussed with Dr.Rai.  Stroke team will sign off.  Kindly call for questions.  Greater than 50% time during this 25-minute visit was spent in counseling and coordination of care discussion with patient and care team and answering questions.      Delia Heady, MD Medical Director [facility] Pager: [phone] 10/19/2022 2:15 PM

## 2022-10-19 NOTE — Progress Notes (Signed)
Physical Therapy Treatment Patient Details Name: [name] MRN: [id number] DOB: June 09, 1960 Today's Date: 10/19/2022   History of Present Illness Patient is a 62 y/o male admitted 10/03/22 with L weakness and numbness.  Received TNK then developed now symptoms of aphasia and R weakness.  He underwent mechanical thrombectomy L M3 segment MCA noted to have small hemorrhage and follow up CT showed progression of hemorrhage 3cm then 7cm and small acute R cerebellar infarct. PMH positive for CAD s/p PCI w/stent and 1v CABG, multiple CGAs, systolic HF s/p ICD, HLD, HTN.    PT Comments  Agreeable to get out of bed and sit up in recliner this afternoon. Education for bed mobility. Able to use LUE to pull and reach for, but needs multimodal cues to do so. Flexed Lt hip and knee to assist with roll (mod assist.) Maxi move used to transfer to recliner. Once upright pt tolerated LE exercises, head rotation and target finding, showing appreciable improvement with looking towards right and sustaining about 10 seconds. Repositioned comfortably. Rt heel protector and RUE splint in place. Patient will continue to benefit from skilled physical therapy services to further improve independence with functional mobility.   Reclined 30 deg in bed BP 89/66 HR 76; bed placed into chair mode BP 110/71 HR 82 (diaphoretic and reports feeling dizzy.) After transfer to recliner, pt LEs elevated and reclined slightly BP 89/73 HR 79.      Assistance Recommended at Discharge Frequent or constant Supervision/Assistance  If plan is discharge home, recommend the following:  Can travel by private vehicle    Two people to help with walking and/or transfers;A lot of help with bathing/dressing/bathroom;Assistance with cooking/housework;Direct supervision/assist for medications management;Assist for transportation;Help with stairs or ramp for entrance;Assistance with feeding   No  Equipment Recommendations  Other (comment) (TBA)     Recommendations for Other Services Rehab consult     Precautions / Restrictions Precautions Precautions: Fall Precaution Comments: R inattention; SBP <160 Restrictions Weight Bearing Restrictions: No     Mobility  Bed Mobility Overal bed mobility: Needs Assistance Bed Mobility: Rolling Rolling: Mod assist         General bed mobility comments: Mod assist for rolling Lt and right in bed, utilzing bed rail and LUE as able, hand over hand guidance to grasp rail, able to hold duration of roll while repositioning and placing hoyer pad. Able to pull Lt knee up, min assist for positioning. Full support for RLE.    Transfers Overall transfer level: Needs assistance Equipment used: Ambulation equipment used               General transfer comment: Used maxi move to transfer to chair today. Pt held RUE with LUE to support. Able to flex head forward to assist with positioning in hoyer. Transfer via Lift Equipment: Maximove  Ambulation/Gait               General Gait Details: unable   Comptroller Bed    Modified Rankin (Stroke Patients Only) Modified Rankin (Stroke Patients Only) Pre-Morbid Rankin Score: Moderate disability Modified Rankin: Severe disability     Balance                                            Cognition Arousal/Alertness:  Awake/alert Behavior During Therapy: Flat affect Overall Cognitive Status: Difficult to assess                                 General Comments: Intermittently following commands for LUE, requires frequent tactile cues. Nods yes when asked if in the hospital, no if he were other locations.        Exercises General Exercises - Lower Extremity Ankle Circles/Pumps: AAROM, Both, 10 reps, Supine Quad Sets: Left, AAROM, Strengthening, 10 reps Long Arc Quad: Strengthening, Left, AAROM, 10 reps, Seated Hip Flexion/Marching: Both, PROM, AAROM,  Strengthening, 10 reps, Supine Other Exercises Other Exercises: Seated in recliner, performed left and right head rotation and target finding. Shows notable improvement in right rotation held for approx 10 seconds. Needs heavy VC for directions.    General Comments General comments (skin integrity, edema, etc.): Reclined 30 deg in bed BP 89/66 HR 76; bed placed into chair mode BP 110/71 HR 82 (diaphoretic and reports feeling dizzy.) After transfer to recliner, pt LEs elevated and reclined slightly BP 89/73 HR 79.      Pertinent Vitals/Pain Pain Assessment Pain Assessment: CPOT Facial Expression: Relaxed, neutral Body Movements: Protection Muscle Tension: Tense, rigid Compliance with ventilator (intubated pts.): N/A Vocalization (extubated pts.): Sighing, moaning CPOT Total: 3 Pain Location: Guarding when lifting legs (likely Hamstring tightness.) Pain Descriptors / Indicators: Grimacing, Guarding Pain Intervention(s): Monitored during session, Repositioned, Limited activity within patient's tolerance    Home Living                          Prior Function            PT Goals (current goals can now be found in the care plan section) Acute Rehab PT Goals Patient Stated Goal: to return home PT Goal Formulation: With patient/family Time For Goal Achievement: 11/01/22 Potential to Achieve Goals: Fair Progress towards PT goals: Progressing toward goals    Frequency    Min 3X/week      PT Plan Current plan remains appropriate    Co-evaluation              AM-PAC PT "6 Clicks" Mobility   Outcome Measure  Help needed turning from your back to your side while in a flat bed without using bedrails?: A Lot Help needed moving from lying on your back to sitting on the side of a flat bed without using bedrails?: Total Help needed moving to and from a bed to a chair (including a wheelchair)?: Total Help needed standing up from a chair using your arms (e.g.,  wheelchair or bedside chair)?: Total Help needed to walk in hospital room?: Total Help needed climbing 3-5 steps with a railing? : Total 6 Click Score: 7    End of Session Equipment Utilized During Treatment: Gait belt;Other (comment) (Rt wrist/hand splint in place.) Activity Tolerance: Patient tolerated treatment well Patient left: with call bell/phone within reach;in chair;with chair alarm set;with SCD's reapplied Nurse Communication: Mobility status;Need for lift equipment PT Visit Diagnosis: Other abnormalities of gait and mobility (R26.89);Other symptoms and signs involving the nervous system (R29.898);Hemiplegia and hemiparesis Hemiplegia - Right/Left: Right Hemiplegia - dominant/non-dominant: Dominant Hemiplegia - caused by: Cerebral infarction;Other Nontraumatic intracranial hemorrhage     Time: 1336-1405 PT Time Calculation (min) (ACUTE ONLY): 29 min  Charges:    $Therapeutic Exercise: 8-22 mins $Therapeutic Activity: 8-22 mins PT General Charges $$ ACUTE  PT VISIT: 1 Visit                     Kathlyn Sacramento, PT, DPT [facility]  Rehabilitation Services Physical Therapist Office: [phone] Website: [email or web address]    Berton Mount 10/19/2022, 3:03 PM

## 2022-10-19 NOTE — Progress Notes (Signed)
ANTICOAGULATION CONSULT NOTE - Follow Up Consult  Pharmacy Consult for heparin  Indication: pseudoaneurysm   No Known Allergies  Patient Measurements: Height: 6\' 1"  (185.4 cm) Weight: 101.2 kg (223 lb 1.7 oz) IBW/kg (Calculated) : 79.9 Heparin Dosing Weight: 101 kg   Vital Signs: Temp: 99.1 F (37.3 C) (07/05 0414) Temp Source: Oral (07/05 0414) BP: 103/69 (07/05 0414) Pulse Rate: 87 (07/05 0414)  Labs: Recent Labs    10/17/22 0439 10/18/22 0221 10/18/22 1901 10/19/22 0531  HGB 15.2 15.0  --  14.5  HCT 43.9 44.7  --  43.3  PLT 233 240  --  247  HEPARINUNFRC 0.44 0.55 0.60 0.63  CREATININE 0.88  --   --   --     Estimated Creatinine Clearance: 110.2 mL/min (by C-G formula based on SCr of 0.88 mg/dL).   Medications:  Scheduled:   atorvastatin  80 mg Per Tube Daily   carvedilol  12.5 mg Per Tube BID WC   Chlorhexidine Gluconate Cloth  6 each Topical Daily   feeding supplement (PROSource TF20)  60 mL Per Tube Daily   fiber supplement (BANATROL TF)  60 mL Per Tube BID   furosemide  40 mg Per Tube BID   gabapentin  300 mg Per Tube QHS   insulin aspart  0-20 Units Subcutaneous Q4H   multivitamin with minerals  1 tablet Per Tube Daily   nicotine  7 mg Transdermal Daily   pantoprazole (PROTONIX) IV  40 mg Intravenous QHS   polyethylene glycol  17 g Per Tube BID   sacubitril-valsartan  1 tablet Per Tube BID   spironolactone  12.5 mg Per Tube Daily   Infusions:   sodium chloride Stopped (10/16/22 1641)    ceFAZolin (ANCEF) IV 2 g (10/19/22 0516)   feeding supplement (OSMOLITE 1.5 CAL) 1,000 mL (10/18/22 2143)   heparin 800 Units/hr (10/19/22 0738)    Assessment: 62 YOM with hx LV pseudoaneurysm s/p patch now with large thrombus burden, admitted with ischemic stroke with hemorrhagic conversion, CT stable today and pharmacy consulted to cautiously start heparin gtt for LV pseudoaneurysm, no bolus with low end goals.     Heparin level is supratherapeutic at 0.63 on  7/5 - on Heparin 1000 units/hr with CBC stable. Per RN no signs/symptoms of bleeding or issues running the drip.  Goal of Therapy:  Heparin level 0.3-0.5 units/ml Monitor platelets by anticoagulation protocol: Yes   Plan:  Decrease heparin to 800 units/hr. Recheck heparin level at 1400.  Daily Heparin level and CBC Monitor for signs/symptoms of bleeding  Roslyn Smiling, PharmD. Pharmacy Resident 10/19/2022,7:38 AM

## 2022-10-19 NOTE — Progress Notes (Addendum)
[facility]                                                                              [name], is a 62 y.o. male, DOB - December 05, 1960, [id number] Admit date - 10/02/2022    Outpatient Primary MD for the patient is [name], MD  LOS - 17  days  Chief Complaint  Patient presents with   Code Stroke       Brief summary   Patient is a 62 year old male with tobacco use,CAD s/p CABG, HFrEF/ HFpEF, ICM w/ICD (MRI compatible), myopericarditis/ dresslers syndrome (2014), HTN, HLD, and multiple CVAs initially presented on 6/18 with left hemiplegia, dysarthria, dizziness, and facial droop. Initial CTH negative, treated w/TNK but subsequently later developed right sided deficits found to have left M3 occlusion s/p NIR on 6/19 w/ successful thrombectomy, however complicated by IPH (left frontoparietal) and SAH.  Developed fever 6/19 without leukocytosis with initial negative CXR, UA, and PCT. Ceftriaxone empirically started 6/25. Found to have large inferior, thrombus filled, pseudoaneurysm with EF 20-25% (prior 25-30% 2022) on TTE with concern for dehiscence of prior pseudoaneurysm patch in 2014 and source of strokes to be embolic. Cardiology consulted 6/23, anticoagulation recommended but deferred given recent IPH and SAH.  On 6/26, blood cultures positive for MSSE. ID consulted 6/27, ceftriaxone switched to cefazolin. Patient continued to have fevers despite abx with increasing leukocytosis on 6/28.  EP consulted for possible ICD extraction given bacteremia but not a candidate at this time.  Heparin gtt started 6/28. Repeat blood cultures 6/27 remain NGTD x 3 days. CTH remains stable 6/29 without neuro changes.  Patient transferred out of neuro ICU, TRH assumed care on 7/3.    Assessment & Plan    Right brain stroke aborted s/p TNK and later that day second stroke with left M2 occlusion s/p IR Left MCA and right cerebellar infarcts left M3 occlusion s/p IR  reperfusion complicated by L frontoparietal ICH and SAH, etiology likely cardioembolic source  -CT 6/18 on presentation no acute finding, subsequent bilateral frontal, parietal, occipital and cerebellum infarcts -CTA head and neck unremarkable.  Repeat CTA head and neck showed news short segment occlusion of distal left M2 status post IR reperfusion complicated by left frontoparietal ICH and SAH -Serial CT head repeated.  CT head 6/28 showed stable hematoma, SAH and IVH have progressed since 10/03/2022 -2D echo showed Large inferior pseudoaneurysm with a depth of 3 cm and a maximum width of roughly 5 cm the wall of the pseudoaneurysm is made above the layer of thrombus that is roughly 1.5 cm thick. EF 30 to 35%, severe hypokinesis of left ventricle. -Lower extremity venous Dopplers negative for DVT -TCD bubble study showed moderate-sized PFO -LDL 63, hemoglobin A1c 5.8 -Prior to admission aspirin 81 mg daily and Plavix 75 mg daily, now on IV heparin per stroke protocol -Management per stroke service, may be a candidate for LTAC -N.p.o. secondary to dysphagia, on tube feeds via cortrack, reevaluated by SLP on 7/3, worsening swallow function.  Palliative medicine following, GOC planned for Monday.   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 -Per ID, TEE when able, to assess in light of bacteremia associated with fever -Repeat blood cultures remain negative, no new fevers or leukocytosis, continue IV cefazolin -No fevers   Acute on chronic systolic CHF exacerbation -Goal-directed medical therapy for his heart  failure per cardiology team -2D echo 6/23 showed EF 30 to 35%, severe global hypokinesis and wall motion abnormality.  -Patient was on IV Lasix/albumin, now transitioned to Lasix via cortrack     Acute hypoxemic respiratory failure -combination of fluid overload and poor pulmonary toileting.  -Improving, currently on room air    HTN, HLD --Continue statin, carvedilol, lasix, metoprolol, entresto, and spironolactone      Dysphagia -continue TF with Osmolite at 15ml/hr and PROsource 60ml daily.  -Appreciate SLP recs, repeat evaluation on 7/3 with worsening swallowing function, continue n.p.o. -palliative medicine consult for GOC, currently receiving tube feeds, ?  PEG tube  Estimated body mass index is 29.44 kg/m as calculated from the following:   Height as of this encounter: 6\' 1"  (1.854 m).   Weight as of this encounter: 101.2 kg.  Code Status: Full code DVT Prophylaxis:  SCDs  Level of Care: Level of care: Telemetry Medical Family Communication: No family at the bedside Disposition Plan:      Remains inpatient appropriate: May be a candidate for LTAC  Antimicrobials:   Anti-infectives (From admission, onward)    Start     Dose/Rate Route Frequency Ordered Stop   10/11/22 1515  cefTRIAXone (ROCEPHIN) 2 g in sodium chloride 0.9 % 100 mL IVPB  Status:  Discontinued        2 g 200 mL/hr over 30 Minutes Intravenous Every 24 hours 10/10/22 1934 10/10/22 1939   10/11/22 1515  cefTRIAXone (ROCEPHIN) 2 g in sodium chloride 0.9 % 100 mL IVPB  Status:  Discontinued        2 g 200 mL/hr over 30 Minutes Intravenous Every 24 hours 10/10/22 1939 10/11/22 0857   10/11/22 0945  ceFAZolin (ANCEF) IVPB 2g/100 mL premix        2 g 200 mL/hr over 30 Minutes Intravenous Every 8 hours 10/11/22 0857     10/10/22 2030  cefTRIAXone (ROCEPHIN) 1 g in sodium chloride 0.9 % 100 mL IVPB  Status:  Discontinued        1 g 200 mL/hr over 30 Minutes Intravenous  Once 10/10/22 1934 10/10/22 1939    10/10/22 2030  cefTRIAXone (ROCEPHIN) 1 g in sodium chloride 0.9 % 100 mL IVPB        1 g 200 mL/hr over 30 Minutes Intravenous  Once 10/10/22 1939 10/10/22 2158   10/09/22 1428  cefTRIAXone (ROCEPHIN) 1 g in sodium chloride 0.9 % 100 mL IVPB  Status:  Discontinued        1 g 200 mL/hr over 30 Minutes Intravenous Every 24 hours 10/09/22 1428 10/10/22 1934   10/09/22 1145  cefTRIAXone (ROCEPHIN) 1 g in sodium chloride 0.9 % 100 mL IVPB  Status:  Discontinued        1 g 200 mL/hr over 30 Minutes Intravenous Every 24 hours 10/09/22 1048 10/09/22 1428          Medications  atorvastatin  80 mg  Per Tube Daily   carvedilol  12.5 mg Per Tube BID WC   Chlorhexidine Gluconate Cloth  6 each Topical Daily   feeding supplement (PROSource TF20)  60 mL Per Tube Daily   fiber supplement (BANATROL TF)  60 mL Per Tube BID   furosemide  40 mg Per Tube BID   gabapentin  300 mg Per Tube QHS   insulin aspart  0-20 Units Subcutaneous Q4H   multivitamin with minerals  1 tablet Per Tube Daily   nicotine  7 mg Transdermal Daily   pantoprazole (PROTONIX) IV  40 mg Intravenous QHS   polyethylene glycol  17 g Per Tube BID   sacubitril-valsartan  1 tablet Per Tube BID   spironolactone  12.5 mg Per Tube Daily      Subjective:   [name] was seen and examined today.  No acute issues overnight.  Mental status remains the same, failed swallow evaluation on 7/3, not following commands.  Appears trying to speak however mostly guttural sounds, not able to comprehend.  Objective:   Vitals:   10/19/22 0037 10/19/22 0414 10/19/22 0800 10/19/22 1100  BP: 90/66 103/69 107/71   Pulse: 88 87 84   Resp:   19   Temp: 98.8 F (37.1 C) 99.1 F (37.3 C) 98.7 F (37.1 C) 99.1 F (37.3 C)  TempSrc: Oral Oral Oral Oral  SpO2: 95% 96% 97%   Weight:      Height:        Intake/Output Summary (Last 24 hours) at 10/19/2022 1104 Last data filed at 10/19/2022 0531 Gross per 24 hour  Intake 2598.46 ml  Output  1850 ml  Net 748.46 ml     Wt Readings from Last 3 Encounters:  10/16/22 101.2 kg  08/06/22 106.1 kg  07/23/22 105.7 kg   Physical Exam General: Awake, dysarthria, core track+ Cardiovascular: S1 S2 clear, RRR.  Respiratory: CTAB anteriorly Gastrointestinal: Soft, nontender, nondistended, NBS Ext: no pedal edema bilaterally Neuro: right-sided weakness, right hand with splint, right lower extremity in heel protector   Data Reviewed:  I have personally reviewed following labs    CBC Lab Results  Component Value Date   WBC 8.4 10/19/2022   RBC 4.98 10/19/2022   HGB 14.5 10/19/2022   HCT 43.3 10/19/2022   MCV 86.9 10/19/2022   MCH 29.1 10/19/2022   PLT 247 10/19/2022   MCHC 33.5 10/19/2022   RDW 13.0 10/19/2022   LYMPHSABS 2.6 10/02/2022   MONOABS 0.7 10/02/2022   EOSABS 0.1 10/02/2022   BASOSABS 0.1 10/02/2022     Last metabolic panel Lab Results  Component Value Date   NA 128 (L) 10/17/2022   K 4.5 10/17/2022   CL 93 (L) 10/17/2022   CO2 24 10/17/2022   BUN 31 (H) 10/17/2022   CREATININE 0.88 10/17/2022   GLUCOSE 197 (H) 10/17/2022   GFRNONAA >60 10/17/2022   GFRAA 92 05/06/2020   CALCIUM 9.3 10/17/2022   PHOS 3.9 10/16/2022   PROT 6.5 10/09/2022   ALBUMIN 3.1 (L) 10/09/2022   LABGLOB 2.4 05/25/2021   AGRATIO 1.8 05/25/2021   BILITOT 1.9 (H) 10/09/2022   ALKPHOS 82 10/09/2022   AST 36 10/09/2022   ALT 24 10/09/2022   ANIONGAP 11 10/17/2022    CBG (last 3)  Recent Labs    10/19/22 0414 10/19/22 0801 10/19/22 1050  GLUCAP 170* 197* 177*      Coagulation Profile: No results for input(s): "INR", "PROTIME" in the last 168 hours.   Radiology Studies:  I have personally reviewed the imaging studies  No results found.     Thad Ranger M.D. [facility] 10/19/2022, 11:04 AM  Available via Epic secure chat 7am-7pm After 7 pm, please refer to night coverage provider listed on amion.

## 2022-10-20 DIAGNOSIS — R1310 Dysphagia, unspecified: Secondary | ICD-10-CM | POA: Diagnosis not present

## 2022-10-20 DIAGNOSIS — R7881 Bacteremia: Secondary | ICD-10-CM | POA: Diagnosis not present

## 2022-10-20 DIAGNOSIS — I639 Cerebral infarction, unspecified: Secondary | ICD-10-CM | POA: Diagnosis not present

## 2022-10-20 DIAGNOSIS — E785 Hyperlipidemia, unspecified: Secondary | ICD-10-CM | POA: Diagnosis not present

## 2022-10-20 LAB — CBC
HCT: 42.9 % (ref 39.0–52.0)
Hemoglobin: 14.2 g/dL (ref 13.0–17.0)
MCH: 29.5 pg (ref 26.0–34.0)
MCHC: 33.1 g/dL (ref 30.0–36.0)
MCV: 89 fL (ref 80.0–100.0)
Platelets: 222 10*3/uL (ref 150–400)
RBC: 4.82 MIL/uL (ref 4.22–5.81)
RDW: 12.9 % (ref 11.5–15.5)
WBC: 7.3 10*3/uL (ref 4.0–10.5)
nRBC: 0 % (ref 0.0–0.2)

## 2022-10-20 LAB — GLUCOSE, CAPILLARY
Glucose-Capillary: 151 mg/dL — ABNORMAL HIGH (ref 70–99)
Glucose-Capillary: 155 mg/dL — ABNORMAL HIGH (ref 70–99)
Glucose-Capillary: 180 mg/dL — ABNORMAL HIGH (ref 70–99)
Glucose-Capillary: 175 mg/dL — ABNORMAL HIGH (ref 70–99)
Glucose-Capillary: 190 mg/dL — ABNORMAL HIGH (ref 70–99)
Glucose-Capillary: 167 mg/dL — ABNORMAL HIGH (ref 70–99)
Glucose-Capillary: 152 mg/dL — ABNORMAL HIGH (ref 70–99)

## 2022-10-20 LAB — HEPARIN LEVEL (UNFRACTIONATED)
Heparin Unfractionated: 0.41 IU/mL (ref 0.30–0.70)
Heparin Unfractionated: 0.52 IU/mL (ref 0.30–0.70)

## 2022-10-20 NOTE — Progress Notes (Addendum)
[facility]                                                                              [name], is a 62 y.o. male, DOB - May 26, 1960, [id number] Admit date - 10/02/2022    Outpatient Primary MD for the patient is [name], MD  LOS - 18  days  Chief Complaint  Patient presents with   Code Stroke       Brief summary   Patient is a 62 year old male with tobacco use,CAD s/p CABG, HFrEF/ HFpEF, ICM w/ICD (MRI compatible), myopericarditis/ dresslers syndrome (2014), HTN, HLD, and multiple CVAs initially presented on 6/18 with left hemiplegia, dysarthria, dizziness, and facial droop. Initial CTH negative, treated w/TNK but subsequently later developed right sided deficits found to have left M3 occlusion s/p NIR on 6/19 w/ successful thrombectomy, however complicated by IPH (left frontoparietal) and SAH.  Developed fever 6/19 without leukocytosis with initial negative CXR, UA, and PCT. Ceftriaxone empirically started 6/25. Found to have large inferior, thrombus filled, pseudoaneurysm with EF 20-25% (prior 25-30% 2022) on TTE with concern for dehiscence of prior pseudoaneurysm patch in 2014 and source of strokes to be embolic. Cardiology consulted 6/23, anticoagulation recommended but deferred given recent IPH and SAH.  On 6/26, blood cultures positive for MSSE. ID consulted 6/27, ceftriaxone switched to cefazolin. Patient continued to have fevers despite abx with increasing leukocytosis on 6/28.  EP consulted for possible ICD extraction given bacteremia but not a candidate at this time.  Heparin gtt started 6/28. Repeat blood cultures 6/27 remain NGTD x 3 days. CTH remains stable 6/29 without neuro changes.  Patient transferred out of neuro ICU, TRH assumed care on 7/3.    Assessment & Plan    Right brain stroke aborted s/p TNK and later that day second stroke with left M2 occlusion s/p IR Left MCA and right cerebellar infarcts left M3 occlusion s/p IR  reperfusion complicated by L frontoparietal ICH and SAH, etiology likely cardioembolic source  -CT 6/18 on presentation no acute finding, subsequent bilateral frontal, parietal, occipital and cerebellum infarcts -CTA head and neck unremarkable.  Repeat CTA head and neck showed news short segment occlusion of distal left M2 status post IR reperfusion complicated by left frontoparietal ICH and SAH -Serial CT head repeated.  CT head 6/28 showed stable hematoma, SAH and IVH have progressed since 10/03/2022 -2D echo showed Large inferior pseudoaneurysm with a depth of 3 cm and a maximum width of roughly 5 cm the wall of the pseudoaneurysm is made above the layer of thrombus that is roughly 1.5 cm thick. EF 30 to 35%, severe hypokinesis of left ventricle. -Lower extremity venous Dopplers negative for DVT -TCD bubble study showed moderate-sized PFO -LDL 63, hemoglobin A1c 5.8 -Prior to admission aspirin 81 mg daily and Plavix 75 mg daily, now on IV heparin per stroke protocol.  I reached out to Dr. Pearlean Brownie regarding further management, awaiting response -Management per stroke service -N.p.o. secondary to dysphagia, on tube feeds via cortrack, reevaluated by SLP on 7/3, worsening swallow function.   -Palliative medicine following, GOC planned for Monday.  May be a candidate for  LTAC Addendum: 2:34 PM discussed with Dr. Pearlean Brownie, recommended eliquis once the PEG tube is placed. Aspirin if needed for CAD.    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 -Per ID, TEE when able, to assess in light of bacteremia associated with  fever -Repeat blood cultures remain negative, no new fevers or leukocytosis, continue IV cefazolin -No fevers   Acute on chronic systolic CHF exacerbation -Goal-directed medical therapy for his heart failure per cardiology team -2D echo 6/23 showed EF 30 to 35%, severe global hypokinesis and wall motion abnormality.  -Patient was on IV Lasix/albumin, now transitioned to Lasix via cortrack     Acute hypoxemic respiratory failure -combination of fluid overload and poor pulmonary toileting.  -Improving, currently on room air    HTN, HLD --Continue statin, carvedilol, lasix, metoprolol, entresto, and spironolactone      Dysphagia -continue TF with Osmolite at 32ml/hr and PROsource 60ml daily.  -Appreciate SLP recs, repeat evaluation on 7/3 with worsening swallowing function, continue n.p.o. -palliative medicine consult for GOC, currently receiving tube feeds, ?  PEG tube  Estimated body mass index is 28.82 kg/m as calculated from the following:   Height as of this encounter: 6\' 1"  (1.854 m).   Weight as of this encounter: 99.1 kg.  Code Status: Full code DVT Prophylaxis:  SCDs  Level of Care: Level of care: Telemetry Medical Family Communication: No family at the bedside Disposition Plan:      Remains inpatient appropriate: May be a candidate for LTAC  Antimicrobials:   Anti-infectives (From admission, onward)    Start     Dose/Rate Route Frequency Ordered Stop   10/11/22 1515  cefTRIAXone (ROCEPHIN) 2 g in sodium chloride 0.9 % 100 mL IVPB  Status:  Discontinued        2 g 200 mL/hr over 30 Minutes Intravenous Every 24 hours 10/10/22 1934 10/10/22 1939   10/11/22 1515  cefTRIAXone (ROCEPHIN) 2 g in sodium chloride 0.9 % 100 mL IVPB  Status:  Discontinued        2 g 200 mL/hr over 30 Minutes Intravenous Every 24 hours 10/10/22 1939 10/11/22 0857   10/11/22 0945  ceFAZolin (ANCEF) IVPB 2g/100 mL premix        2 g 200 mL/hr over 30 Minutes Intravenous Every 8 hours 10/11/22  0857     10/10/22 2030  cefTRIAXone (ROCEPHIN) 1 g in sodium chloride 0.9 % 100 mL IVPB  Status:  Discontinued        1 g 200 mL/hr over 30 Minutes Intravenous  Once 10/10/22 1934 10/10/22 1939   10/10/22 2030  cefTRIAXone (ROCEPHIN) 1 g in sodium chloride 0.9 % 100 mL IVPB        1 g 200 mL/hr over 30 Minutes Intravenous  Once 10/10/22 1939 10/10/22 2158   10/09/22 1428  cefTRIAXone (ROCEPHIN) 1 g in sodium chloride 0.9 % 100 mL IVPB  Status:  Discontinued        1 g 200 mL/hr over 30 Minutes Intravenous Every 24 hours 10/09/22 1428 10/10/22 1934   10/09/22 1145  cefTRIAXone (ROCEPHIN) 1 g in sodium chloride 0.9 % 100 mL IVPB  Status:  Discontinued  1 g 200 mL/hr over 30 Minutes Intravenous Every 24 hours 10/09/22 1048 10/09/22 1428          Medications  atorvastatin  80 mg Per Tube Daily   carvedilol  12.5 mg Per Tube BID WC   Chlorhexidine Gluconate Cloth  6 each Topical Daily   feeding supplement (PROSource TF20)  60 mL Per Tube Daily   fiber supplement (BANATROL TF)  60 mL Per Tube BID   furosemide  40 mg Per Tube BID   gabapentin  300 mg Per Tube QHS   insulin aspart  0-20 Units Subcutaneous Q4H   multivitamin with minerals  1 tablet Per Tube Daily   nicotine  7 mg Transdermal Daily   pantoprazole (PROTONIX) IV  40 mg Intravenous QHS   polyethylene glycol  17 g Per Tube BID   sacubitril-valsartan  1 tablet Per Tube BID   spironolactone  12.5 mg Per Tube Daily      Subjective:   [name] was seen and examined today.  Somnolent, not following commands.    Objective:   Vitals:   10/20/22 0307 10/20/22 0500 10/20/22 0915 10/20/22 1200  BP: 120/71  103/71 94/68  Pulse: 77  77 82  Resp: 20  20 20   Temp: 98.3 F (36.8 C)  97.9 F (36.6 C) 98.6 F (37 C)  TempSrc: Oral  Axillary Axillary  SpO2: 97%  97% 100%  Weight:  99.1 kg    Height:        Intake/Output Summary (Last 24 hours) at 10/20/2022 1320 Last data filed at 10/20/2022 1206 Gross per 24  hour  Intake 2927.35 ml  Output 2550 ml  Net 377.35 ml     Wt Readings from Last 3 Encounters:  10/20/22 99.1 kg  08/06/22 106.1 kg  07/23/22 105.7 kg    Physical Exam General: Somnolent, core track+ Cardiovascular: S1 S2 clear, RRR.  Respiratory: CTAB anteriorly Gastrointestinal: Soft, nontender, nondistended, NBS Ext: no pedal edema bilaterally Neuro: right weakness Psych: somnolent and lethargic   Data Reviewed:  I have personally reviewed following labs    CBC Lab Results  Component Value Date   WBC 7.3 10/20/2022   RBC 4.82 10/20/2022   HGB 14.2 10/20/2022   HCT 42.9 10/20/2022   MCV 89.0 10/20/2022   MCH 29.5 10/20/2022   PLT 222 10/20/2022   MCHC 33.1 10/20/2022   RDW 12.9 10/20/2022   LYMPHSABS 2.6 10/02/2022   MONOABS 0.7 10/02/2022   EOSABS 0.1 10/02/2022   BASOSABS 0.1 10/02/2022     Last metabolic panel Lab Results  Component Value Date   NA 128 (L) 10/17/2022   K 4.5 10/17/2022   CL 93 (L) 10/17/2022   CO2 24 10/17/2022   BUN 31 (H) 10/17/2022   CREATININE 0.88 10/17/2022   GLUCOSE 197 (H) 10/17/2022   GFRNONAA >60 10/17/2022   GFRAA 92 05/06/2020   CALCIUM 9.3 10/17/2022   PHOS 3.9 10/16/2022   PROT 6.5 10/09/2022   ALBUMIN 3.1 (L) 10/09/2022   LABGLOB 2.4 05/25/2021   AGRATIO 1.8 05/25/2021   BILITOT 1.9 (H) 10/09/2022   ALKPHOS 82 10/09/2022   AST 36 10/09/2022   ALT 24 10/09/2022   ANIONGAP 11 10/17/2022    CBG (last 3)  Recent Labs    10/20/22 0830 10/20/22 0918 10/20/22 1203  GLUCAP 155* 180* 175*      Coagulation Profile: No results for input(s): "INR", "PROTIME" in the last 168 hours.   Radiology Studies: I have personally  reviewed the imaging studies  No results found.     Thad Ranger M.D. [facility] 10/20/2022, 1:20 PM  Available via Epic secure chat 7am-7pm After 7 pm, please refer to night coverage provider listed on amion.

## 2022-10-20 NOTE — Progress Notes (Signed)
Wife arriving, found patient incontinent of stool. Stating " I found him with dried sh... on him, what would have happened had I not checked on him." Wife very upset, accusing staff of patient neglect, making rude and derogatory statements towards myself and nurse tec. I assured her that I had been checking and turning [name] q 2 hrs (in addition to nurse tech.) I voiced that I had been providing excellent nursing care and that I did not neglect patient. Wife stating "you need to be gone." Patient was bathed by tech and myself, no dried feces noted, skin in excellent condition.  Charge nurse notified. Supplied [name]'s wife with [employer] manager's contact info.

## 2022-10-20 NOTE — Progress Notes (Signed)
ANTICOAGULATION CONSULT NOTE - Follow Up Consult  Pharmacy Consult for heparin Indication:  LV pseudoaneurysm  Labs: Recent Labs    10/18/22 0221 10/18/22 1901 10/19/22 0531 10/19/22 1414 10/19/22 2125 10/20/22 0528  HGB 15.0  --  14.5  --   --  14.2  HCT 44.7  --  43.3  --   --  42.9  PLT 240  --  247  --   --  222  HEPARINUNFRC 0.55   < > 0.63 0.30 0.26* 0.41   < > = values in this interval not displayed.    Assessment/Plan:  62yo male therapeutic on heparin after rate change. Will continue infusion at current rate of 900 units/hr and confirm stable with additional level.  [name], PharmD, BCPS 10/20/2022 6:56 AM

## 2022-10-20 NOTE — Progress Notes (Signed)
ANTICOAGULATION CONSULT NOTE - Follow Up Consult  Pharmacy Consult for heparin  Indication: LV pseudoaneurysm / thrombus  No Known Allergies  Patient Measurements: Height: 6\' 1"  (185.4 cm) Weight: 99.1 kg (218 lb 7.6 oz) IBW/kg (Calculated) : 79.9 Heparin Dosing Weight: 101 kg   Vital Signs: Temp: 98.6 F (37 C) (07/06 1200) Temp Source: Axillary (07/06 1200) BP: 94/68 (07/06 1200) Pulse Rate: 82 (07/06 1200)  Labs: Recent Labs    10/18/22 0221 10/18/22 1901 10/19/22 0531 10/19/22 1414 10/19/22 2125 10/20/22 0528 10/20/22 1046  HGB 15.0  --  14.5  --   --  14.2  --   HCT 44.7  --  43.3  --   --  42.9  --   PLT 240  --  247  --   --  222  --   HEPARINUNFRC 0.55   < > 0.63   < > 0.26* 0.41 0.52   < > = values in this interval not displayed.     Estimated Creatinine Clearance: 109.2 mL/min (by C-G formula based on SCr of 0.88 mg/dL).   Assessment: 60 YOM with hx LV pseudoaneurysm s/p patch now with large thrombus burden, admitted with ischemic stroke with hemorrhagic conversion, CT stable and pharmacy consulted to cautiously start heparin gtt for LV pseudoaneurysm, no bolus with low end goals.     Heparin level now 0.5 at top of therapeutic goal on heparin drip rate 900 units/hr. No bleeding / cbc stable   Goal of Therapy:  Heparin level 0.3-0.5 units/ml Monitor platelets by anticoagulation protocol: Yes   Plan:  Decrease heparin 850 units/hr.  Daily heparin level and cbc  Monitor s/s bleeding    Leota Sauers Pharm.D. CPP, BCPS Clinical Pharmacist [phone] 10/20/2022 2:27 PM   **Pharmacist phone directory can be found on [email or web address] listed under [facility].

## 2022-10-21 DIAGNOSIS — R1310 Dysphagia, unspecified: Secondary | ICD-10-CM | POA: Diagnosis not present

## 2022-10-21 DIAGNOSIS — E785 Hyperlipidemia, unspecified: Secondary | ICD-10-CM | POA: Diagnosis not present

## 2022-10-21 DIAGNOSIS — R7881 Bacteremia: Secondary | ICD-10-CM | POA: Diagnosis not present

## 2022-10-21 DIAGNOSIS — I639 Cerebral infarction, unspecified: Secondary | ICD-10-CM | POA: Diagnosis not present

## 2022-10-21 LAB — CBC
HCT: 44.4 % (ref 39.0–52.0)
Hemoglobin: 14.7 g/dL (ref 13.0–17.0)
MCH: 29.1 pg (ref 26.0–34.0)
MCHC: 33.1 g/dL (ref 30.0–36.0)
MCV: 87.7 fL (ref 80.0–100.0)
Platelets: 214 10*3/uL (ref 150–400)
RBC: 5.06 MIL/uL (ref 4.22–5.81)
RDW: 12.9 % (ref 11.5–15.5)
WBC: 7.2 10*3/uL (ref 4.0–10.5)
nRBC: 0 % (ref 0.0–0.2)

## 2022-10-21 LAB — GLUCOSE, CAPILLARY
Glucose-Capillary: 140 mg/dL — ABNORMAL HIGH (ref 70–99)
Glucose-Capillary: 152 mg/dL — ABNORMAL HIGH (ref 70–99)
Glucose-Capillary: 166 mg/dL — ABNORMAL HIGH (ref 70–99)
Glucose-Capillary: 171 mg/dL — ABNORMAL HIGH (ref 70–99)
Glucose-Capillary: 189 mg/dL — ABNORMAL HIGH (ref 70–99)

## 2022-10-21 LAB — HEPARIN LEVEL (UNFRACTIONATED): Heparin Unfractionated: 0.35 IU/mL (ref 0.30–0.70)

## 2022-10-21 MED ORDER — FAMOTIDINE 20 MG PO TABS
20.0000 mg | ORAL_TABLET | Freq: Two times a day (BID) | ORAL | Status: DC
  Administered 2022-10-21 – 2022-10-30 (×17): 20 mg
  Filled 2022-10-21 (×17): qty 1

## 2022-10-21 MED ORDER — FUROSEMIDE 40 MG PO TABS
40.0000 mg | ORAL_TABLET | Freq: Two times a day (BID) | ORAL | Status: DC
  Administered 2022-10-21 – 2022-10-30 (×17): 40 mg
  Filled 2022-10-21 (×18): qty 1

## 2022-10-21 NOTE — Progress Notes (Signed)
[facility]                                                                              [name], is a 62 y.o. male, DOB - 1960-11-19, ZOX:[id number] Admit date - 10/02/2022    Outpatient Primary MD for the patient is Robert Matar, MD  LOS - 19  days  Chief Complaint  Patient presents with   Code Stroke       Brief summary   Patient is a 62 year old male with tobacco use,CAD s/p CABG, HFrEF/ HFpEF, ICM w/ICD (MRI compatible), myopericarditis/ dresslers syndrome (2014), HTN, HLD, and multiple CVAs initially presented on 6/18 with left hemiplegia, dysarthria, dizziness, and facial droop. Initial CTH negative, treated w/TNK but subsequently later developed right sided deficits found to have left M3 occlusion s/p NIR on 6/19 w/ successful thrombectomy, however complicated by IPH (left frontoparietal) and SAH.  Developed fever 6/19 without leukocytosis with initial negative CXR, UA, and PCT. Ceftriaxone empirically started 6/25. Found to have large inferior, thrombus filled, pseudoaneurysm with EF 20-25% (prior 25-30% 2022) on TTE with concern for dehiscence of prior pseudoaneurysm patch in 2014 and source of strokes to be embolic. Cardiology consulted 6/23, anticoagulation recommended but deferred given recent IPH and SAH.  On 6/26, blood cultures positive for MSSE. ID consulted 6/27, ceftriaxone switched to cefazolin. Patient continued to have fevers despite abx with increasing leukocytosis on 6/28.  EP consulted for possible ICD extraction given bacteremia but not a candidate at this time.  Heparin gtt started 6/28. Repeat blood cultures 6/27 remain NGTD x 3 days. CTH remains stable 6/29 without neuro changes.  Patient transferred out of neuro ICU, TRH assumed care on 7/3.    Assessment & Plan    Right brain stroke aborted s/p TNK and later that day second stroke with left M2 occlusion s/p IR Left MCA and right cerebellar infarcts left M3 occlusion s/p IR  reperfusion complicated by L frontoparietal ICH and SAH, etiology likely cardioembolic source  -CT 6/18 on presentation no acute finding, subsequent bilateral frontal, parietal, occipital and cerebellum infarcts -CTA head and neck unremarkable.  Repeat CTA head and neck showed news short segment occlusion of distal left M2 status post IR reperfusion complicated by left frontoparietal ICH and SAH -Serial CT head repeated.  CT head 6/28 showed stable hematoma, SAH and IVH have progressed since 10/03/2022 -2D echo showed Large inferior pseudoaneurysm with a depth of 3 cm and a maximum width of roughly 5 cm the wall of the pseudoaneurysm is made above the layer of thrombus that is roughly 1.5 cm thick. EF 30 to 35%, severe hypokinesis of left ventricle. -Lower extremity venous Dopplers negative for DVT -TCD bubble study showed moderate-sized PFO -LDL 63, hemoglobin A1c 5.8 -Prior to admission aspirin 81 mg daily and Plavix 75 mg daily, now on IV heparin per stroke protocol.  -N.p.o. secondary to dysphagia, on tube feeds via cortrack, reevaluated by SLP on 7/3, worsening swallow function.   -Palliative medicine following, GOC planned for Monday.  May be a candidate for LTAC -discussed with Dr. Pearlean Brownie on 7/6, recommended eliquis once the PEG tube is placed.  Aspirin if needed for CAD.    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 -Per ID, TEE when able, to assess in light of bacteremia associated with fever -Repeat blood cultures remain negative, no new fevers or leukocytosis  -Continue IV cefazolin, ID following -No  fevers   Acute on chronic systolic CHF exacerbation -Goal-directed medical therapy for his heart failure per cardiology team -2D echo 6/23 showed EF 30 to 35%, severe global hypokinesis and wall motion abnormality.  -Patient was on IV Lasix/albumin, now transitioned to Lasix via cortrack     Acute hypoxemic respiratory failure -combination of fluid overload and poor pulmonary toileting.  -Improving, currently on room air    HTN, HLD --Continue statin, carvedilol, lasix, metoprolol, entresto, and spironolactone      Dysphagia -continue TF with Osmolite at 8ml/hr and PROsource 60ml daily.  -Appreciate SLP recs, repeat evaluation on 7/3 with worsening swallowing function, continue n.p.o. -palliative medicine consult for GOC, currently receiving tube feeds, ? PEG tube  Estimated body mass index is 28.82 kg/m as calculated from the following:   Height as of this encounter: 6\' 1"  (1.854 m).   Weight as of this encounter: 99.1 kg.  Code Status: Full code DVT Prophylaxis:  SCDs  Level of Care: Level of care: Telemetry Medical Family Communication: No family at the bedside Disposition Plan:      Remains inpatient appropriate: May be a candidate for LTAC  Antimicrobials:   Anti-infectives (From admission, onward)    Start     Dose/Rate Route Frequency Ordered Stop   10/11/22 1515  cefTRIAXone (ROCEPHIN) 2 g in sodium chloride 0.9 % 100 mL IVPB  Status:  Discontinued        2 g 200 mL/hr over 30 Minutes Intravenous Every 24 hours 10/10/22 1934 10/10/22 1939   10/11/22 1515  cefTRIAXone (ROCEPHIN) 2 g in sodium chloride 0.9 % 100 mL IVPB  Status:  Discontinued        2 g 200 mL/hr over 30 Minutes Intravenous Every 24 hours 10/10/22 1939 10/11/22 0857   10/11/22 0945  ceFAZolin (ANCEF) IVPB 2g/100 mL premix        2 g 200 mL/hr over 30 Minutes Intravenous Every 8 hours 10/11/22 0857     10/10/22 2030  cefTRIAXone (ROCEPHIN) 1 g in sodium chloride 0.9 % 100 mL IVPB  Status:   Discontinued        1 g 200 mL/hr over 30 Minutes Intravenous  Once 10/10/22 1934 10/10/22 1939   10/10/22 2030  cefTRIAXone (ROCEPHIN) 1 g in sodium chloride 0.9 % 100 mL IVPB        1 g 200 mL/hr over 30 Minutes Intravenous  Once 10/10/22 1939 10/10/22 2158   10/09/22 1428  cefTRIAXone (ROCEPHIN) 1 g in sodium chloride 0.9 % 100 mL IVPB  Status:  Discontinued        1 g 200 mL/hr over 30 Minutes Intravenous Every 24 hours 10/09/22 1428 10/10/22 1934   10/09/22 1145  cefTRIAXone (ROCEPHIN) 1 g in sodium chloride 0.9 % 100 mL IVPB  Status:  Discontinued        1 g 200 mL/hr over 30 Minutes Intravenous Every  24 hours 10/09/22 1048 10/09/22 1428          Medications  atorvastatin  80 mg Per Tube Daily   carvedilol  12.5 mg Per Tube BID WC   Chlorhexidine Gluconate Cloth  6 each Topical Daily   feeding supplement (PROSource TF20)  60 mL Per Tube Daily   fiber supplement (BANATROL TF)  60 mL Per Tube BID   furosemide  40 mg Per Tube BID   gabapentin  300 mg Per Tube QHS   insulin aspart  0-20 Units Subcutaneous Q4H   multivitamin with minerals  1 tablet Per Tube Daily   nicotine  7 mg Transdermal Daily   pantoprazole (PROTONIX) IV  40 mg Intravenous QHS   polyethylene glycol  17 g Per Tube BID   sacubitril-valsartan  1 tablet Per Tube BID   spironolactone  12.5 mg Per Tube Daily      Subjective:   Zackeri Doucette was seen and examined today.  Alert and awake, no acute issues, difficult to comprehend speech.  Core track+  Objective:   Vitals:   10/20/22 2014 10/20/22 2323 10/21/22 0317 10/21/22 0835  BP: 111/77 107/81 114/73 109/83  Pulse: 85 76 85 85  Resp: 20 18 19 15   Temp: 98.7 F (37.1 C) 97.7 F (36.5 C) 97.7 F (36.5 C) (!) 97.5 F (36.4 C)  TempSrc: Axillary Oral Oral Axillary  SpO2:  100% 97% 99%  Weight:      Height:        Intake/Output Summary (Last 24 hours) at 10/21/2022 1224 Last data filed at 10/20/2022 1856 Gross per 24 hour  Intake 186.23 ml   Output 650 ml  Net -463.77 ml     Wt Readings from Last 3 Encounters:  10/20/22 99.1 kg  08/06/22 106.1 kg  07/23/22 105.7 kg   Physical Exam General: Alert and awake, dysarthria with expressive aphasia, core track+ Cardiovascular: S1 S2 clear, RRR.  Respiratory: CTAB, no wheezing Gastrointestinal: Soft, nontender, nondistended, NBS Ext: no pedal edema bilaterally Neuro: right-sided weakness   Data Reviewed:  I have personally reviewed following labs    CBC Lab Results  Component Value Date   WBC 7.2 10/21/2022   RBC 5.06 10/21/2022   HGB 14.7 10/21/2022   HCT 44.4 10/21/2022   MCV 87.7 10/21/2022   MCH 29.1 10/21/2022   PLT 214 10/21/2022   MCHC 33.1 10/21/2022   RDW 12.9 10/21/2022   LYMPHSABS 2.6 10/02/2022   MONOABS 0.7 10/02/2022   EOSABS 0.1 10/02/2022   BASOSABS 0.1 10/02/2022     Last metabolic panel Lab Results  Component Value Date   NA 128 (L) 10/17/2022   K 4.5 10/17/2022   CL 93 (L) 10/17/2022   CO2 24 10/17/2022   BUN 31 (H) 10/17/2022   CREATININE 0.88 10/17/2022   GLUCOSE 197 (H) 10/17/2022   GFRNONAA >60 10/17/2022   GFRAA 92 05/06/2020   CALCIUM 9.3 10/17/2022   PHOS 3.9 10/16/2022   PROT 6.5 10/09/2022   ALBUMIN 3.1 (L) 10/09/2022   LABGLOB 2.4 05/25/2021   AGRATIO 1.8 05/25/2021   BILITOT 1.9 (H) 10/09/2022   ALKPHOS 82 10/09/2022   AST 36 10/09/2022   ALT 24 10/09/2022   ANIONGAP 11 10/17/2022    CBG (last 3)  Recent Labs    10/21/22 0318 10/21/22 0838 10/21/22 1144  GLUCAP 140* 152* 166*      Coagulation Profile: No results for input(s): "INR", "PROTIME" in the last 168 hours.   Radiology Studies:  I have personally reviewed the imaging studies  No results found.     Thad Ranger M.D. [facility] 10/21/2022, 12:24 PM  Available via Epic secure chat 7am-7pm After 7 pm, please refer to night coverage provider listed on amion.

## 2022-10-21 NOTE — Progress Notes (Signed)
ANTICOAGULATION CONSULT NOTE - Follow Up Consult  Pharmacy Consult for heparin  Indication: LV pseudoaneurysm / thrombus  No Known Allergies  Patient Measurements: Height: 6\' 1"  (185.4 cm) Weight: 99.1 kg (218 lb 7.6 oz) IBW/kg (Calculated) : 79.9 Heparin Dosing Weight: 101 kg   Vital Signs: Temp: 97.5 F (36.4 C) (07/07 0835) Temp Source: Axillary (07/07 0835) BP: 109/83 (07/07 0835) Pulse Rate: 85 (07/07 0835)  Labs: Recent Labs    10/19/22 0531 10/19/22 1414 10/20/22 0528 10/20/22 1046 10/21/22 0548  HGB 14.5  --  14.2  --  14.7  HCT 43.3  --  42.9  --  44.4  PLT 247  --  222  --  214  HEPARINUNFRC 0.63   < > 0.41 0.52 0.35   < > = values in this interval not displayed.     Estimated Creatinine Clearance: 109.2 mL/min (by C-G formula based on SCr of 0.88 mg/dL).   Assessment: 63 YOM with hx LV pseudoaneurysm s/p patch now with large thrombus burden, admitted with ischemic stroke with hemorrhagic conversion, CT stable and pharmacy consulted to cautiously start heparin gtt for LV pseudoaneurysm, no bolus with low end goals.     Heparin level now at 0.35 at lower end of therapeutic goal on heparin drip rate 850 units/hr. No bleeding reported / CBC stable   Goal of Therapy:  Heparin level 0.3-0.5 units/ml Monitor platelets by anticoagulation protocol: Yes   Plan:  Continue heparin 850 units/hr.  Daily heparin level and CBC Monitor signs and symptoms of bleeding    Stephenie Acres, PharmD PGY1 Pharmacy Resident 10/21/2022 11:06 AM   **Pharmacist phone directory can be found on [email or web address] listed under [facility].

## 2022-10-22 ENCOUNTER — Inpatient Hospital Stay (HOSPITAL_COMMUNITY): Payer: Medicare PPO

## 2022-10-22 DIAGNOSIS — Z66 Do not resuscitate: Secondary | ICD-10-CM | POA: Diagnosis not present

## 2022-10-22 DIAGNOSIS — R1310 Dysphagia, unspecified: Secondary | ICD-10-CM | POA: Diagnosis not present

## 2022-10-22 DIAGNOSIS — T827XXA Infection and inflammatory reaction due to other cardiac and vascular devices, implants and grafts, initial encounter: Secondary | ICD-10-CM | POA: Diagnosis not present

## 2022-10-22 DIAGNOSIS — R1312 Dysphagia, oropharyngeal phase: Secondary | ICD-10-CM | POA: Diagnosis not present

## 2022-10-22 DIAGNOSIS — B957 Other staphylococcus as the cause of diseases classified elsewhere: Secondary | ICD-10-CM | POA: Diagnosis not present

## 2022-10-22 DIAGNOSIS — I639 Cerebral infarction, unspecified: Secondary | ICD-10-CM | POA: Diagnosis not present

## 2022-10-22 DIAGNOSIS — R7881 Bacteremia: Secondary | ICD-10-CM | POA: Diagnosis not present

## 2022-10-22 DIAGNOSIS — E785 Hyperlipidemia, unspecified: Secondary | ICD-10-CM | POA: Diagnosis not present

## 2022-10-22 LAB — GLUCOSE, CAPILLARY
Glucose-Capillary: 131 mg/dL — ABNORMAL HIGH (ref 70–99)
Glucose-Capillary: 131 mg/dL — ABNORMAL HIGH (ref 70–99)
Glucose-Capillary: 158 mg/dL — ABNORMAL HIGH (ref 70–99)
Glucose-Capillary: 173 mg/dL — ABNORMAL HIGH (ref 70–99)
Glucose-Capillary: 173 mg/dL — ABNORMAL HIGH (ref 70–99)
Glucose-Capillary: 184 mg/dL — ABNORMAL HIGH (ref 70–99)
Glucose-Capillary: 189 mg/dL — ABNORMAL HIGH (ref 70–99)

## 2022-10-22 LAB — CBC
HCT: 45.7 % (ref 39.0–52.0)
Hemoglobin: 15.1 g/dL (ref 13.0–17.0)
MCH: 29.8 pg (ref 26.0–34.0)
MCHC: 33 g/dL (ref 30.0–36.0)
MCV: 90.3 fL (ref 80.0–100.0)
Platelets: 229 K/uL (ref 150–400)
RBC: 5.06 MIL/uL (ref 4.22–5.81)
RDW: 13 % (ref 11.5–15.5)
WBC: 7 K/uL (ref 4.0–10.5)
nRBC: 0 % (ref 0.0–0.2)

## 2022-10-22 LAB — HEPARIN LEVEL (UNFRACTIONATED): Heparin Unfractionated: 0.28 IU/mL — ABNORMAL LOW (ref 0.30–0.70)

## 2022-10-22 NOTE — Progress Notes (Signed)
Patient ID: FERRY HOTTLE, male   DOB: [birth year], 62 y.o.   MRN: [id number]    Progress Note from the Palliative Medicine Team at [facility]   Patient Name: [name]        Date: 10/22/2022 DOB: 1960-12-03  Age: 62 y.o. MRN#: [id number] Attending Physician: Cathren Harsh, MD Primary Care Physician: [name], MD Admit Date: 10/02/2022   Extensive chart review has been completed prior to meeting with patient/family  including labs, vital signs, imaging, progress/consult notes, orders, medications and available advance directive documents.    62 y/o male who presented 6/19 with L weakness and numbness. Pt received TNK then developed new symptoms of aphasia and R weakness. He underwent mechanical thrombectomy L M3 segment MCA 6/19. Pt noted to have small hemorrhage and follow up CT showed progression of hemorrhage 3cm then 7cm and small acute R cerebellar infarct. PMH: CAD s/p PCI w/stent and 1v CABG, multiple CGAs, systolic HF s/p ICD, HLD, HTN.  Initial PMT consult 10-16-2022  This NP assessed patient at the bedside as a follow up palliative medicine needs and emotional support and to meet with family as scheduled for ongoing conversation regarding treatment plan.  Patient is alert and tracks.  Remains with core Trak secondary to severe dysphagia.    He attempts his speech, it is only guttural sounds, no meaningful communication.  I met with patient's son [name] and patient's significant other [name].    Education offered on patient's current medical situation; left MCA and right cerebellar infarcts, history of previous strokes, CHF status post ICD, large inferior cardiac pseudoaneurysm, dysphagia, possible aspiration pneumonia.  Education offered on significant increased risk of aspiration in a patient with dysphagia. Education offered on risks and benefits of artificial feeding specific to PEG  Detailed likely trajectory into the future requiring  artificial nutrition, long-term SNF care, ongoing increased risk of infections; upper respiratory, UTI, skin.   Education offered on the difference between a full medical support path versus a comfort path focusing on comfort and dignity, allowing for natural death  Education offered on hospice benefit; loss of for ineligibility   Plan of Care: -DNR/DNI- documented today -Family requests PEG placement for nutritional support -continue with all offered and available medical interventions to prolong life -When medically stable SNF for ongoing rehab  Questions and concerns addressed  Emotional support offered as family face the seriousness of patient's current medical situation and the difficult decisions they face.        [name] [name] is next of kin and main decision maker  MOST form completed   Education offered today regarding  the importance of continued conversation with family and the medical providers regarding overall plan of care and treatment options,  ensuring decisions are within the context of the patients values and GOCs.  Time: 75   minutes  Detailed review of medical records ( labs, imaging, vital signs), medically appropriate exam ( MS, skin, resp)   discussed with treatment team, counseling and education to patient, family, staff, documenting clinical information, medication management, coordination of care    [name] NP  Palliative Medicine Team Team Phone # [phone] Pager [phone]

## 2022-10-22 NOTE — Progress Notes (Signed)
ANTICOAGULATION CONSULT NOTE - Follow Up Consult  Pharmacy Consult for heparin  Indication: LV pseudoaneurysm / thrombus  No Known Allergies  Patient Measurements: Height: 6\' 1"  (185.4 cm) Weight: 99.1 kg (218 lb 7.6 oz) IBW/kg (Calculated) : 79.9 Heparin Dosing Weight: 101 kg   Vital Signs: Temp: 98.3 F (36.8 C) (07/08 0733) Temp Source: Oral (07/08 0733) BP: 99/78 (07/08 0733) Pulse Rate: 85 (07/08 0733)  Labs: Recent Labs    10/20/22 0528 10/20/22 1046 10/21/22 0548 10/22/22 0621  HGB 14.2  --  14.7  --   HCT 42.9  --  44.4  --   PLT 222  --  214  --   HEPARINUNFRC 0.41 0.52 0.35 0.28*    Estimated Creatinine Clearance: 109.2 mL/min (by C-G formula based on SCr of 0.88 mg/dL).   Assessment: 45 YOM with hx LV pseudoaneurysm s/p patch now with large thrombus burden, admitted with ischemic stroke with hemorrhagic conversion, CT stable and pharmacy consulted to cautiously start heparin gtt for LV pseudoaneurysm, no bolus with low end goals.     Heparin level now at 0.28 on heparin drip rate 850 units/hr. No bleeding reported / CBC stable   Will increase heparin to 900 units/hr and recheck level in AM.   Goal of Therapy:  Heparin level 0.3-0.5 units/ml Monitor platelets by anticoagulation protocol: Yes   Plan:  Increase heparin to 900 units/hr Daily heparin level and CBC Monitor signs and symptoms of bleeding    Thank you for involving pharmacy in the patient's care.   Theotis Burrow, PharmD PGY1 Acute Care Pharmacy Resident  10/22/2022 8:10 AM   **Pharmacist phone directory can be found on [email or web address] listed under [facility].

## 2022-10-22 NOTE — Progress Notes (Signed)
    [facility]   Reason for visit: Follow up on bacteremia  Interval History: family meeting today with palliative care; WBC 7, remains afebrile.   Day 14 total antibiotics  Physical Exam: Constitutional:  Vitals:   10/22/22 1115 10/22/22 1132  BP: (!) 74/55 107/74  Pulse: 88   Resp: 17   Temp: 99 F (37.2 C) 98.6 F (37 C)  SpO2: 95% 97%   He is in nad Respiratory: normal respiratory effort  Review of Systems: Unable to be assessed due to mental status  Lab Results  Component Value Date   WBC 7.0 10/22/2022   HGB 15.1 10/22/2022   HCT 45.7 10/22/2022   MCV 90.3 10/22/2022   PLT 229 10/22/2022    Lab Results  Component Value Date   CREATININE 0.88 10/17/2022   BUN 31 (H) 10/17/2022   NA 128 (L) 10/17/2022   K 4.5 10/17/2022   CL 93 (L) 10/17/2022   CO2 24 10/17/2022    Lab Results  Component Value Date   ALT 24 10/09/2022   AST 36 10/09/2022   ALKPHOS 82 10/09/2022     Microbiology: No results found for this or any previous visit (from the past 240 hour(s)).  Impression/Plan:  1. Bacteremia - Staph epidermidis positive blood culture in one bottle in each set associated with an ICD.  Blood cultures positive 6/25.  Follow up cultures remained negative. Will continue with cefazolin.  Concern for vegetation on ICD but not able to undergo TEE at this time.  Will need eveluation at some point, depending on scope of care.   2.  Palliative care - discussions underway on deciding on path forward.  Will continue to follow  I have personally spent 52 minutes involved in face-to-face and non-face-to-face activities for this patient on the day of the visit. Professional time spent includes the following activities: Preparing to see the patient (review of tests), Obtaining and/or reviewing separately obtained history (admission/discharge record), Performing a medically appropriate examination and/or evaluation , Ordering  medications/tests/procedures, referring and communicating with other health care professionals, Documenting clinical information in the EMR, Independently interpreting results (not separately reported), Communicating results to the patient/family/caregiver, Counseling and educating the patient/family/caregiver and Care coordination (not separately reported).

## 2022-10-22 NOTE — Plan of Care (Signed)
  Problem: Education: Goal: Knowledge of disease or condition will improve Outcome: Progressing Goal: Knowledge of secondary prevention will improve (MUST DOCUMENT ALL) Outcome: Progressing Goal: Knowledge of patient specific risk factors will improve (Mark N/A or DELETE if not current risk factor) Outcome: Progressing   Problem: Ischemic Stroke/TIA Tissue Perfusion: Goal: Complications of ischemic stroke/TIA will be minimized Outcome: Progressing   Problem: Coping: Goal: Will verbalize positive feelings about self Outcome: Progressing Goal: Will identify appropriate support needs Outcome: Progressing   Problem: Health Behavior/Discharge Planning: Goal: Ability to manage health-related needs will improve Outcome: Progressing Goal: Goals will be collaboratively established with patient/family Outcome: Progressing   Problem: Self-Care: Goal: Ability to participate in self-care as condition permits will improve Outcome: Progressing Goal: Verbalization of feelings and concerns over difficulty with self-care will improve Outcome: Progressing Goal: Ability to communicate needs accurately will improve Outcome: Progressing   Problem: Nutrition: Goal: Risk of aspiration will decrease Outcome: Progressing Goal: Dietary intake will improve Outcome: Progressing   Problem: Education: Goal: Knowledge of General Education information will improve Description: Including pain rating scale, medication(s)/side effects and non-pharmacologic comfort measures Outcome: Progressing   Problem: Health Behavior/Discharge Planning: Goal: Ability to manage health-related needs will improve Outcome: Progressing   Problem: Clinical Measurements: Goal: Ability to maintain clinical measurements within normal limits will improve Outcome: Progressing Goal: Will remain free from infection Outcome: Progressing Goal: Diagnostic test results will improve Outcome: Progressing Goal: Respiratory  complications will improve Outcome: Progressing Goal: Cardiovascular complication will be avoided Outcome: Progressing   Problem: Activity: Goal: Risk for activity intolerance will decrease Outcome: Progressing   Problem: Nutrition: Goal: Adequate nutrition will be maintained Outcome: Progressing   Problem: Coping: Goal: Level of anxiety will decrease Outcome: Progressing   Problem: Elimination: Goal: Will not experience complications related to bowel motility Outcome: Progressing Goal: Will not experience complications related to urinary retention Outcome: Progressing   Problem: Pain Managment: Goal: General experience of comfort will improve Outcome: Progressing   Problem: Safety: Goal: Ability to remain free from injury will improve Outcome: Progressing   Problem: Skin Integrity: Goal: Risk for impaired skin integrity will decrease Outcome: Progressing   Problem: Education: Goal: Understanding of CV disease, CV risk reduction, and recovery process will improve Outcome: Progressing Goal: Individualized Educational Video(s) Outcome: Progressing   Problem: Activity: Goal: Ability to return to baseline activity level will improve Outcome: Progressing   Problem: Cardiovascular: Goal: Ability to achieve and maintain adequate cardiovascular perfusion will improve Outcome: Progressing Goal: Vascular access site(s) Level 0-1 will be maintained Outcome: Progressing   Problem: Health Behavior/Discharge Planning: Goal: Ability to safely manage health-related needs after discharge will improve Outcome: Progressing   

## 2022-10-22 NOTE — [place] FL2 (Signed)
[place] MEDICAID FL2 LEVEL OF CARE FORM     IDENTIFICATION  Patient Name: [name] Birthdate: October 07, 1960 Sex: male Admission Date (Current Location): 10/02/2022  [facility] and [place] Number:  Producer, television/film/video and Address:  [facility], [address] [place], [place] [address]      Provider Number: [id number]  Attending Physician Name and Address:  Cathren Harsh, MD  Relative Name and Phone Number:       Current Level of Care: Hospital Recommended Level of Care: Skilled Nursing Facility Prior Approval Number:    Date Approved/Denied:   PASRR Number: [id number]  Discharge Plan: SNF    Current Diagnoses: Patient Active Problem List   Diagnosis Date Noted   Bacteremia due to Gram-positive bacteria 10/16/2022   Stroke (cerebrum) (HCC) 10/02/2022   Cerebellar stroke (HCC) 08/06/2022   Cryptogenic stroke (HCC) 08/06/2022   At risk for obstructive sleep apnea 08/06/2022   Tobacco use disorder 08/06/2022   Abnormality of gait due to impairment of balance 08/06/2022   TIA (transient ischemic attack) 05/04/2022   Chronic systolic CHF (congestive heart failure) (HCC) 05/04/2022   Expressive dysphasia 05/04/2022   Unsteady gait 05/04/2022   Near syncope 05/04/2022   Chronic systolic heart failure (HCC) 12/01/2021   ICD (implantable cardioverter-defibrillator) in place 12/01/2021   Rectal bleed 03/06/2021   Unstable angina (HCC)    Hx of CABG 07/09/2019   Lumbar radiculopathy 12/04/2018   Glaucoma 12/04/2018   Cortical age-related cataract of both eyes 12/04/2018   Pain management contract agreement 12/04/2018   Lumbar herniated disc 06/04/2018   Prediabetes 01/01/2018   Hyperlipidemia 12/30/2017   Obesity (BMI 30.0-34.9) 12/30/2017   Benign neoplasm of descending colon    Essential hypertension 11/03/2015   Cardiomyopathy, ischemic 06/03/2015   Chronic systolic dysfunction of left ventricle 06/03/2015   Erectile dysfunction  due to arterial insufficiency 12/23/2014   Lipoma of skin and subcutaneous tissue of neck 05/24/2014   History of cardioembolic stroke 11/02/2013   Vision loss of right eye 06/23/2013   Dyslipidemia 06/22/2013   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[facility])    Coronary artery disease    Cardiomyopathy (HCC)    Dysphagia    Pseudoaneurysm of left ventricle of heart    Tobacco abuse     Orientation RESPIRATION BLADDER Height & Weight     Self  Normal Incontinent Weight: 218 lb 7.6 oz (99.1 kg) Height:  6\' 1"  (185.4 cm)  BEHAVIORAL SYMPTOMS/MOOD NEUROLOGICAL BOWEL NUTRITION STATUS      Incontinent Feeding tube (Osmolite 1.5 65 mL/hr)  AMBULATORY STATUS COMMUNICATION OF NEEDS Skin   Extensive Assist Verbally Surgical wounds (right groin puncture wound, no dressing)                       Personal Care Assistance Level of Assistance  Bathing, Feeding, Dressing Bathing Assistance: Maximum assistance Feeding assistance: Maximum assistance Dressing Assistance: Maximum assistance     Functional Limitations Info  Sight Sight Info: Impaired        SPECIAL CARE FACTORS FREQUENCY  PT (By licensed PT), OT (By licensed OT)     PT Frequency: 5x/wk OT Frequency: 5x/wk            Contractures Contractures Info: Not present    Additional Factors Info  Code Status, Allergies, Insulin Sliding Scale Code Status Info: DNR Allergies Info: NKA   Insulin Sliding Scale Info: see DC summary       Current Medications (  10/22/2022):  This is the current hospital active medication list Current Facility-Administered Medications  Medication Dose Route Frequency Provider Last Rate Last Admin   0.9 %  sodium chloride infusion   Intravenous PRN Micki Riley, MD   Stopped at 10/16/22 1641   acetaminophen (TYLENOL) tablet 650 mg  650 mg Oral Q4H PRN de Melchor Amour, Jerilynn Mages, MD       Or   acetaminophen (TYLENOL) 160 MG/5ML solution 650 mg  650 mg Per Tube Q4H PRN de Melchor Amour, Jerilynn Mages, MD   650  mg at 10/19/22 1235   Or   acetaminophen (TYLENOL) suppository 650 mg  650 mg Rectal Q4H PRN de Melchor Amour, Jerilynn Mages, MD       atorvastatin (LIPITOR) tablet 80 mg  80 mg Per Tube Daily Marvel Plan, MD   80 mg at 10/22/22 1042   carvedilol (COREG) tablet 12.5 mg  12.5 mg Per Tube BID WC Croitoru, Mihai, MD   12.5 mg at 10/22/22 0818   ceFAZolin (ANCEF) IVPB 2g/100 mL premix  2 g Intravenous Buena Irish, MD 200 mL/hr at 10/22/22 0609 2 g at 10/22/22 1610   Chlorhexidine Gluconate Cloth 2 % PADS 6 each  6 each Topical Daily Marvel Plan, MD   6 each at 10/19/22 0910   famotidine (PEPCID) tablet 20 mg  20 mg Per Tube BID Rai, Ripudeep K, MD   20 mg at 10/22/22 1042   feeding supplement (OSMOLITE 1.5 CAL) liquid 1,000 mL  1,000 mL Per Tube Continuous Elmer Picker, NP 65 mL/hr at 10/22/22 0740 1,000 mL at 10/22/22 0740   feeding supplement (PROSource TF20) liquid 60 mL  60 mL Per Tube Daily Elmer Picker, NP   60 mL at 10/22/22 1047   fiber supplement (BANATROL TF) liquid 60 mL  60 mL Per Tube BID Micki Riley, MD   60 mL at 10/22/22 1045   furosemide (LASIX) tablet 40 mg  40 mg Per Tube BID Rai, Ripudeep K, MD   40 mg at 10/22/22 0813   gabapentin (NEURONTIN) 250 MG/5ML solution 300 mg  300 mg Per Tube QHS Marvel Plan, MD   300 mg at 10/21/22 2228   heparin ADULT infusion 100 units/mL (25000 units/281mL)  900 Units/hr Intravenous Continuous Weingarten, Rachael I, RPH 9 mL/hr at 10/22/22 0829 900 Units/hr at 10/22/22 0829   insulin aspart (novoLOG) injection 0-20 Units  0-20 Units Subcutaneous Q4H Micki Riley, MD   3 Units at 10/22/22 0814   metoprolol tartrate (LOPRESSOR) injection 5 mg  5 mg Intravenous Q5 min PRN Croitoru, Mihai, MD   5 mg at 10/09/22 1545   multivitamin with minerals tablet 1 tablet  1 tablet Per Tube Daily Micki Riley, MD   1 tablet at 10/22/22 1042   nicotine (NICODERM CQ - dosed in mg/24 hr) patch 7 mg  7 mg Transdermal Daily Marvel Plan, MD   7 mg at 10/22/22  1042   ondansetron (ZOFRAN) injection 4 mg  4 mg Intravenous Q6H PRN Marvel Plan, MD   4 mg at 10/06/22 1704   Oral care mouth rinse  15 mL Mouth Rinse PRN Erick Blinks, MD       pneumococcal 20-valent conjugate vaccine (PREVNAR 20) injection 0.5 mL  0.5 mL Intramuscular Prior to discharge Micki Riley, MD       polyethylene glycol (MIRALAX / GLYCOLAX) packet 17 g  17 g Per Tube BID Marvel Plan, MD   17 g at 10/22/22 1042  sacubitril-valsartan (ENTRESTO) 49-51 mg per tablet  1 tablet Per Tube BID Croitoru, Mihai, MD   1 tablet at 10/22/22 1046   senna-docusate (Senokot-S) tablet 1 tablet  1 tablet Oral QHS PRN Marvel Plan, MD   1 tablet at 10/06/22 2133   spironolactone (ALDACTONE) tablet 12.5 mg  12.5 mg Per Tube Daily Croitoru, Mihai, MD   12.5 mg at 10/22/22 1049     Discharge Medications: Please see discharge summary for a list of discharge medications.  Relevant Imaging Results:  Relevant Lab Results:   Additional Information SS#: [id number]  Baldemar Lenis, LCSW

## 2022-10-22 NOTE — Progress Notes (Addendum)
[facility]                                                                              [name], is a 62 y.o. male, DOB - 09-02-1960, NWG:[id number] Admit date - 10/02/2022    Outpatient Primary MD for the patient is [name], MD  LOS - 20  days  Chief Complaint  Patient presents with   Code Stroke       Brief summary   Patient is a 62 year old male with tobacco use,CAD s/p CABG, HFrEF/ HFpEF, ICM w/ICD (MRI compatible), myopericarditis/ dresslers syndrome (2014), HTN, HLD, and multiple CVAs initially presented on 6/18 with left hemiplegia, dysarthria, dizziness, and facial droop. Initial CTH negative, treated w/TNK but subsequently later developed right sided deficits found to have left M3 occlusion s/p NIR on 6/19 w/ successful thrombectomy, however complicated by IPH (left frontoparietal) and SAH.  Developed fever 6/19 without leukocytosis with initial negative CXR, UA, and PCT. Ceftriaxone empirically started 6/25. Found to have large inferior, thrombus filled, pseudoaneurysm with EF 20-25% (prior 25-30% 2022) on TTE with concern for dehiscence of prior pseudoaneurysm patch in 2014 and source of strokes to be embolic. Cardiology consulted 6/23, anticoagulation recommended but deferred given recent IPH and SAH.  On 6/26, blood cultures positive for MSSE. ID consulted 6/27, ceftriaxone switched to cefazolin. Patient continued to have fevers despite abx with increasing leukocytosis on 6/28.  EP consulted for possible ICD extraction given bacteremia but not a candidate at this time.  Heparin gtt started 6/28. Repeat blood cultures 6/27 remain NGTD x 3 days. CTH remains stable 6/29 without neuro changes.  Patient transferred out of neuro ICU, TR[facility] assumed care on 7/3.    7/8: GOC, family requested PEG tube  Assessment & Plan    Right brain stroke aborted s/p TNK and later that day second stroke with left M2 occlusion s/p IR Left MCA and right cerebellar  infarcts left M3 occlusion s/p IR reperfusion complicated by L frontoparietal ICH and SAH, etiology likely cardioembolic source  -CT 6/18 on presentation no acute finding, subsequent bilateral frontal, parietal, occipital and cerebellum infarcts -CTA head and neck unremarkable.  Repeat CTA head and neck showed news short segment occlusion of distal left M2 status post IR reperfusion complicated by left frontoparietal ICH and SAH -Serial CT head repeated.  CT head 6/28 showed stable hematoma, SAH and IVH have progressed since 10/03/2022 -2D echo showed Large inferior pseudoaneurysm with a depth of 3 cm and a maximum width of roughly 5 cm the wall of the pseudoaneurysm is made above the layer of thrombus that is roughly 1.5 cm thick. EF 30 to 35%, severe hypokinesis of left ventricle. -Lower extremity venous Dopplers negative for DVT -TCD bubble study showed moderate-sized PFO -LDL 63, hemoglobin A1c 5.8 -Prior to admission aspirin 81 mg daily and Plavix 75 mg daily, now on IV heparin per stroke protocol.  -N.p.o. secondary to dysphagia, on tube feeds via cortrack, reevaluated by SLP on 7/3, worsening swallow function.   -discussed with Dr. Pearlean Brownie on 7/6, recommended eliquis once the PEG tube is placed. Aspirin if needed for CAD. -Palliative medicine  following, GOC today, family wants to pursue PEG tube.  IR consult placed Addendum: IR requested surgery consult for PEG placement due to anatomy issues.  -consulted surgery.     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 -Per ID, TEE when able, to assess in light of  bacteremia associated with fever -Repeat blood cultures remain negative, no new fevers or leukocytosis  -Continue IV cefazolin, ID following   Acute on chronic systolic CHF exacerbation -Goal-directed medical therapy for his heart failure per cardiology team -2D echo 6/23 showed EF 30 to 35%, severe global hypokinesis and wall motion abnormality.  -Patient was on IV Lasix/albumin, now transitioned to Lasix via cortrack     Acute hypoxemic respiratory failure -combination of fluid overload and poor pulmonary toileting.  -Stable, O2 sats 97% on room air    HTN, HLD --Continue statin, carvedilol, lasix, metoprolol, entresto, and spironolactone      Dysphagia -continue TF with Osmolite at 22ml/hr and PROsource 60ml daily.  -Appreciate SLP recs, repeat evaluation on 7/3 with worsening swallowing function, continue n.p.o. -palliative medicine consult for GOC, currently receiving tube feeds, ? PEG tube  Estimated body mass index is 28.82 kg/m as calculated from the following:   Height as of this encounter: 6\' 1"  (1.854 m).   Weight as of this encounter: 99.1 kg.  Code Status: Full code DVT Prophylaxis:  SCDs  Level of Care: Level of care: Telemetry Medical Family Communication: No family at the bedside Disposition Plan:      Remains inpatient appropriate: May be a candidate for LTAC after PEG tube placement  Antimicrobials:   Anti-infectives (From admission, onward)    Start     Dose/Rate Route Frequency Ordered Stop   10/11/22 1515  cefTRIAXone (ROCEPHIN) 2 g in sodium chloride 0.9 % 100 mL IVPB  Status:  Discontinued        2 g 200 mL/hr over 30 Minutes Intravenous Every 24 hours 10/10/22 1934 10/10/22 1939   10/11/22 1515  cefTRIAXone (ROCEPHIN) 2 g in sodium chloride 0.9 % 100 mL IVPB  Status:  Discontinued        2 g 200 mL/hr over 30 Minutes Intravenous Every 24 hours 10/10/22 1939 10/11/22 0857   10/11/22 0945  ceFAZolin (ANCEF) IVPB 2g/100 mL premix        2 g 200  mL/hr over 30 Minutes Intravenous Every 8 hours 10/11/22 0857     10/10/22 2030  cefTRIAXone (ROCEPHIN) 1 g in sodium chloride 0.9 % 100 mL IVPB  Status:  Discontinued        1 g 200 mL/hr over 30 Minutes Intravenous  Once 10/10/22 1934 10/10/22 1939   10/10/22 2030  cefTRIAXone (ROCEPHIN) 1 g in sodium chloride 0.9 % 100 mL IVPB        1 g 200 mL/hr over 30 Minutes Intravenous  Once 10/10/22 1939 10/10/22 2158   10/09/22 1428  cefTRIAXone (ROCEPHIN) 1 g in sodium chloride 0.9 % 100 mL IVPB  Status:  Discontinued        1 g 200 mL/hr over 30 Minutes Intravenous Every 24 hours 10/09/22 1428 10/10/22 1934   10/09/22 1145  cefTRIAXone (ROCEPHIN) 1 g  in sodium chloride 0.9 % 100 mL IVPB  Status:  Discontinued        1 g 200 mL/hr over 30 Minutes Intravenous Every 24 hours 10/09/22 1048 10/09/22 1428          Medications  atorvastatin  80 mg Per Tube Daily   carvedilol  12.5 mg Per Tube BID WC   Chlorhexidine Gluconate Cloth  6 each Topical Daily   famotidine  20 mg Per Tube BID   feeding supplement (PROSource TF20)  60 mL Per Tube Daily   fiber supplement (BANATROL TF)  60 mL Per Tube BID   furosemide  40 mg Per Tube BID   gabapentin  300 mg Per Tube QHS   insulin aspart  0-20 Units Subcutaneous Q4H   multivitamin with minerals  1 tablet Per Tube Daily   nicotine  7 mg Transdermal Daily   polyethylene glycol  17 g Per Tube BID   sacubitril-valsartan  1 tablet Per Tube BID   spironolactone  12.5 mg Per Tube Daily      Subjective:   [name] was seen and examined today.  Alert and awake, no acute issues overnight.  Core track+  Objective:   Vitals:   10/22/22 0733 10/22/22 0817 10/22/22 1115 10/22/22 1132  BP: 99/78 102/68 (!) 74/55 107/74  Pulse: 85  88   Resp: 20  17   Temp: 98.3 F (36.8 C)  99 F (37.2 C) 98.6 F (37 C)  TempSrc: Oral  Oral Oral  SpO2: 98%  95% 97%  Weight:      Height:        Intake/Output Summary (Last 24 hours) at 10/22/2022  1208 Last data filed at 10/22/2022 1108 Gross per 24 hour  Intake --  Output 1950 ml  Net -1950 ml     Wt Readings from Last 3 Encounters:  10/20/22 99.1 kg  08/06/22 106.1 kg  07/23/22 105.7 kg   Physical Exam General: Alert, awake, expressive aphasia, core track+ Cardiovascular: S1 S2 clear, RRR.  Respiratory: CTAB, no wheezing Gastrointestinal: Soft, nontender, nondistended, NBS Ext: no pedal edema bilaterally Neuro: right-sided weakness   Data Reviewed:  I have personally reviewed following labs    CBC Lab Results  Component Value Date   WBC 7.0 10/22/2022   RBC 5.06 10/22/2022   HGB 15.1 10/22/2022   HCT 45.7 10/22/2022   MCV 90.3 10/22/2022   MCH 29.8 10/22/2022   PLT 229 10/22/2022   MCHC 33.0 10/22/2022   RDW 13.0 10/22/2022   LYMPHSABS 2.6 10/02/2022   MONOABS 0.7 10/02/2022   EOSABS 0.1 10/02/2022   BASOSABS 0.1 10/02/2022     Last metabolic panel Lab Results  Component Value Date   NA 128 (L) 10/17/2022   K 4.5 10/17/2022   CL 93 (L) 10/17/2022   CO2 24 10/17/2022   BUN 31 (H) 10/17/2022   CREATININE 0.88 10/17/2022   GLUCOSE 197 (H) 10/17/2022   GFRNONAA >60 10/17/2022   GFRAA 92 05/06/2020   CALCIUM 9.3 10/17/2022   PHOS 3.9 10/16/2022   PROT 6.5 10/09/2022   ALBUMIN 3.1 (L) 10/09/2022   LABGLOB 2.4 05/25/2021   AGRATIO 1.8 05/25/2021   BILITOT 1.9 (H) 10/09/2022   ALKPHOS 82 10/09/2022   AST 36 10/09/2022   ALT 24 10/09/2022   ANIONGAP 11 10/17/2022    CBG (last 3)  Recent Labs    10/22/22 0401 10/22/22 0810 10/22/22 1115  GLUCAP 158* 131* 189*  Coagulation Profile: No results for input(s): "INR", "PROTIME" in the last 168 hours.   Radiology Studies: I have personally reviewed the imaging studies  No results found.     Thad Ranger M.D. [facility] 10/22/2022, 12:08 PM  Available via Epic secure chat 7am-7pm After 7 pm, please refer to night coverage provider listed on amion.

## 2022-10-22 NOTE — Progress Notes (Signed)
Speech Language Pathology Treatment: Dysphagia;Cognitive-Linquistic  Patient Details Name: [name] MRN: [id number] DOB: 1960-07-31 Today's Date: 10/22/2022 Time: 7846-9629 SLP Time Calculation (min) (ACUTE ONLY): 22 min  Assessment / Plan / Recommendation Clinical Impression  Pt seen with family at bedside after having a meeting with palliative to discuss GOC and moving forward with PEG placement.The family addresses concerns with eating and drinking liberally due to concerns for infection which could lead to adverse effects. Education was provided regarding pt's interest in and eagerness to eat and drink as well as the trace amount of POs that would be provided in a therapeutic setting to assess functional compensatory strategies that can be implemented to potentially improve swallowing function, which his family verbalized understanding. SLP provided small teaspoons of purees and nectar thick liquids with Mod verbal cueing to cough/clear throat after each swallow. His cough remains somewhat weak and he does not consistently follow commands to cough, although has more success when given a model. He did not follow commands to initiate a dry swallow, although was able to trigger a swallow when presented with a dry spoon and Mod verbal cues. Given the small, controlled teaspoonfuls provided, pt's amount of residue and anterior spillage improved at bedside compared to last MBS. Recommend pt remain NPO with continued efforts to therapeutically sample small amounts of POs to target effective strategies during his ongoing rehabilitation.   Pt's verbal output appeared better today. Given phonemic cues and models, pt produced the word "more" and answered "no" appropriately in social conversations. Pt's voice remains severely dysarthric and hyponasal, although note minimal verbal responses. When asked to use a towel to wipe his face independently, pt lifted it to his mouth and bit it, but was able to  effectively clean the L side of his face given a model. He was unsuccessful with wiping the R side of his face when given Max verbal and tactile cues. SLP will continue to f/u to address cognition, language, and swallowing through functional treatment activities.     HPI HPI: Pt is a 62 y/o male who presented 6/19 with L weakness and numbness. Pt received TNK then developed new symptoms of aphasia and R weakness. He underwent mechanical thrombectomy L M3 segment MCA 6/19. Pt noted to have small hemorrhage and follow up CT showed progression of hemorrhage 3cm then 7cm and small acute R cerebellar infarct. PMH: CAD s/p PCI w/stent and 1v CABG, multiple CGAs, systolic HF s/p ICD, HLD, HTN.      SLP Plan  Continue with current plan of care      Recommendations for follow up therapy are one component of a multi-disciplinary discharge planning process, led by the attending physician.  Recommendations may be updated based on patient status, additional functional criteria and insurance authorization.    Recommendations  Diet recommendations: NPO                  Oral care BID   Frequent or constant Supervision/Assistance Aphasia (R47.01);Dysphagia, oropharyngeal phase (R13.12)     Continue with current plan of care    [name], M.A., CF-SLP Speech Language Pathology, Acute Rehabilitation Services  Secure Chat preferred [phone]   10/22/2022, 12:08 PM

## 2022-10-22 NOTE — Progress Notes (Signed)
Transported to CT 

## 2022-10-22 NOTE — Plan of Care (Signed)
IR was requested for image guided G tube placement.   CT abdomen reviewed by Dr. Fredia Sorrow, no percutaneous window to allow for safe percutaneous gastrostomy tube placement due to interposition of a dilated transverse colon and splenic flexure between the stomach and abdominal wall.   Ordering provider notified.   Will delete the G tube placement order.  Please call IR for questions and concerns.   Lynann Bologna Hulbert Branscome PA-C 10/22/2022 4:11 PM

## 2022-10-23 DIAGNOSIS — I10 Essential (primary) hypertension: Secondary | ICD-10-CM

## 2022-10-23 DIAGNOSIS — E785 Hyperlipidemia, unspecified: Secondary | ICD-10-CM | POA: Diagnosis not present

## 2022-10-23 DIAGNOSIS — R7881 Bacteremia: Secondary | ICD-10-CM | POA: Diagnosis not present

## 2022-10-23 DIAGNOSIS — R1310 Dysphagia, unspecified: Secondary | ICD-10-CM | POA: Diagnosis not present

## 2022-10-23 DIAGNOSIS — I639 Cerebral infarction, unspecified: Secondary | ICD-10-CM | POA: Diagnosis not present

## 2022-10-23 LAB — CBC
HCT: 43.3 % (ref 39.0–52.0)
Hemoglobin: 14.2 g/dL (ref 13.0–17.0)
MCH: 29.2 pg (ref 26.0–34.0)
MCHC: 32.8 g/dL (ref 30.0–36.0)
MCV: 89.1 fL (ref 80.0–100.0)
Platelets: 215 10*3/uL (ref 150–400)
RBC: 4.86 MIL/uL (ref 4.22–5.81)
RDW: 13 % (ref 11.5–15.5)
WBC: 7.7 10*3/uL (ref 4.0–10.5)
nRBC: 0 % (ref 0.0–0.2)

## 2022-10-23 LAB — GLUCOSE, CAPILLARY
Glucose-Capillary: 140 mg/dL — ABNORMAL HIGH (ref 70–99)
Glucose-Capillary: 151 mg/dL — ABNORMAL HIGH (ref 70–99)
Glucose-Capillary: 164 mg/dL — ABNORMAL HIGH (ref 70–99)
Glucose-Capillary: 181 mg/dL — ABNORMAL HIGH (ref 70–99)
Glucose-Capillary: 181 mg/dL — ABNORMAL HIGH (ref 70–99)
Glucose-Capillary: 234 mg/dL — ABNORMAL HIGH (ref 70–99)

## 2022-10-23 LAB — BASIC METABOLIC PANEL
Anion gap: 12 (ref 5–15)
BUN: 53 mg/dL — ABNORMAL HIGH (ref 8–23)
CO2: 24 mmol/L (ref 22–32)
Calcium: 9.4 mg/dL (ref 8.9–10.3)
Chloride: 97 mmol/L — ABNORMAL LOW (ref 98–111)
Creatinine, Ser: 1.06 mg/dL (ref 0.61–1.24)
GFR, Estimated: >60 mL/min (ref >60)
Glucose, Bld: 181 mg/dL — ABNORMAL HIGH (ref 70–99)
Potassium: 4.5 mmol/L (ref 3.5–5.1)
Sodium: 133 mmol/L — ABNORMAL LOW (ref 135–145)

## 2022-10-23 LAB — HEPARIN LEVEL (UNFRACTIONATED): Heparin Unfractionated: 0.46 IU/mL (ref 0.30–0.70)

## 2022-10-23 MED ORDER — SACUBITRIL-VALSARTAN 24-26 MG PO TABS
1.0000 | ORAL_TABLET | Freq: Two times a day (BID) | ORAL | Status: DC
  Administered 2022-10-23 – 2022-10-29 (×13): 1
  Filled 2022-10-23 (×14): qty 1

## 2022-10-23 NOTE — Consult Note (Signed)
Consult Note  KISHAN WACHSMUTH 13-May-1960  [id number].    Requesting MD: Thad Ranger, MD Chief Complaint/Reason for Consult: PEG placement  HPI:  Patient is a 62 year old male who is admitted s/p CVA. He presented 6/19 with left sided weakness and numbness, got TNK and then developed aphasia and right sided weakness. He underwent mechanical thrombectomy L M3 segment MCA 6/19. Pt noted to have small hemorrhage and follow up CT showed progression of hemorrhage 3cm then 7cm and small acute R cerebellar infarct. PMH: CAD s/p PCI w/stent and 1v CABG, multiple CGAs, systolic HF s/p ICD, HLD, HTN. Patient's hospitalization has been complicated by aspiration PNA and bacteremia. He is being followed by SLP but has severe dysphagia and has been unable to tolerate PO intake. Palliative medicine has been following and patient's family would like to proceed with PEG placement for artificial nutrition.   ROS: Review of Systems  Unable to perform ROS: Other  Aphasia   Family History  Problem Relation Age of Onset   Heart attack Mother    Diabetes Mother    Hypertension Mother    Hypertension Father    CAD Other    HIV Brother    Stroke Neg Hx     Past Medical History:  Diagnosis Date   Abnormal EKG    hx of ischemia showing up on ekg's   Acute myopericarditis    a. 03/2013: readm with hypoxia, tachycardia, elevated ESR/CRP, elevated troponin with Dressler syndrome and myopericarditis; treated with steroids.   Acute respiratory failure (HCC)    a. 01/2013: VDRF. b. 03/2013: hypoxia requiring supp O2 during adm, resolved by discharge.   C. difficile colitis    a. 01/2013 during prolonged adm. b. Recurred 03/2013.   Cardiac tamponade    a. 01/2013 s/p drain.   Cataracts, bilateral    Coronary artery disease    a. s/p MI in 2009 in [place] with stenting of the LCX and LAD;  b. 12/2012 NSTEMI/CAD: LM nl, LAD patent prox stent, LCX 50-70 isr (FFR 0.84), RCA dom, 155m, EF 40-45%-->Med Rx. c.  01/2013: anterolateral STEMI complicated by pericardial effusion (presumed purulent pericarditis) and tamponade s/p drain, ruptured LV pseudoaneurysm s/p CorMatrix patch, CABGx1 (SVG-OM1), fever, CVA, VDRF, C diff.   CVA (cerebral infarction)    a. 01/2013 in setting of prolonged hospitalization - residual L arm weakness.   Dressler syndrome (HCC)    a. 03/2013: readm with hypoxia, tachycardia, elevated ESR/CRP, elevated troponin with Dressler syndrome and myopericarditis; treated with steroids.   Glaucoma    Hyperlipidemia    Hypokalemia    Hyponatremia    a. During late 2014.   Ischemic cardiomyopathy    a. Sept/Oct 2014: EF ~40% (ICM). b. 03/2013: EF 25-30%. Off ACEI due to hypotension (MIXED NICM/ICM).   Optic neuropathy, ischemic    Pericardial effusion    a. 01/2013: pericardial effusion (presumed purulent pericarditis) and cardiac tamponade s/p drain. b. Persistent moderate pericardial effusion 03/2013.   Pre-diabetes    Pseudoaneurysm of left ventricle of heart    a. 01/2013:  Ruptured inferoposterior LV pseudoaneurysm s/p CorMatrix patch.   Sinus bradycardia    asymptomatic   Stroke [facility]) 2009   weak lt side-lt arm   Tobacco abuse    Wears dentures    top    Past Surgical History:  Procedure Laterality Date   CARDIAC CATHETERIZATION  01/06/2013   CATARACT EXTRACTION Bilateral 05/2021   COLONOSCOPY WITH PROPOFOL N/A 07/03/2016  Procedure: COLONOSCOPY WITH PROPOFOL;  Surgeon: Ruffin Frederick, MD;  Location: [facility];  Service: Gastroenterology;  Laterality: N/A;   CORONARY ANGIOPLASTY WITH STENT PLACEMENT  2000's X 2   "1 + 1" (01/06/2013)   CORONARY ARTERY BYPASS GRAFT N/A 01/28/2013   Procedure: CORONARY ARTERY BYPASS GRAFTING (CABG);  Surgeon: Loreli Slot, MD;  Location: [facility] OR;  Service: Open Heart Surgery;  Laterality: N/A;  CABG x one, using left greater saphenous vein harvested endoscopically   ICD IMPLANT N/A 05/25/2020   Procedure: ICD IMPLANT;   Surgeon: Hillis Range, MD;  Location: [facility] INVASIVE CV LAB;  Service: Cardiovascular;  Laterality: N/A;   INTRAOPERATIVE TRANSESOPHAGEAL ECHOCARDIOGRAM N/A 01/28/2013   Procedure: INTRAOPERATIVE TRANSESOPHAGEAL ECHOCARDIOGRAM;  Surgeon: Loreli Slot, MD;  Location: [facility] OR;  Service: Open Heart Surgery;  Laterality: N/A;   IR CT HEAD LTD  10/03/2022   IR PERCUTANEOUS ART THROMBECTOMY/INFUSION INTRACRANIAL INC DIAG ANGIO  10/03/2022   IR US GUIDE VASC ACCESS RIGHT  10/03/2022   LEFT HEART CATH AND CORS/GRAFTS ANGIOGRAPHY N/A 09/13/2020   Procedure: LEFT HEART CATH AND CORS/GRAFTS ANGIOGRAPHY;  Surgeon: Swaziland, Peter M, MD;  Location: [facility] INVASIVE CV LAB;  Service: Cardiovascular;  Laterality: N/A;   LEFT HEART CATHETERIZATION WITH CORONARY ANGIOGRAM N/A 01/06/2013   Procedure: LEFT HEART CATHETERIZATION WITH CORONARY ANGIOGRAM;  Surgeon: Peter M Swaziland, MD;  Location: [facility] CATH LAB;  Service: Cardiovascular;  Laterality: N/A;   LEFT HEART CATHETERIZATION WITH CORONARY ANGIOGRAM N/A 01/21/2013   Procedure: LEFT HEART CATHETERIZATION WITH CORONARY ANGIOGRAM;  Surgeon: Kathleene Hazel, MD;  Location: [facility] CATH LAB;  Service: Cardiovascular;  Laterality: N/A;   LUMBAR LAMINECTOMY/ DECOMPRESSION WITH MET-RX Right 06/30/2018   Procedure: Right minimally invasive Lumbar Three-Four Far lateral discectomy;  Surgeon: Jadene Pierini, MD;  Location: [facility] OR;  Service: Neurosurgery;  Laterality: Right;  Right minimally invasive Lumbar Three-Four Far lateral discectomy   MASS EXCISION Right 07/21/2014   Procedure: EXCISION RIGHT NECK MASS;  Surgeon: Harriette Bouillon, MD;  Location: [facility];  Service: General;  Laterality: Right;   PERICARDIAL TAP N/A 01/24/2013   Procedure: PERICARDIAL TAP;  Surgeon: Micheline Chapman, MD;  Location: [facility] CATH LAB;  Service: Cardiovascular;  Laterality: N/A;   RADIOLOGY WITH ANESTHESIA N/A 10/03/2022   Procedure: IR WITH ANESTHESIA;  Surgeon: Julieanne Cotton, MD;  Location: [facility] OR;  Service: Radiology;  Laterality: N/A;   RIGHT HEART CATHETERIZATION Right 01/24/2013   Procedure: RIGHT HEART CATH;  Surgeon: Micheline Chapman, MD;  Location: [facility] CATH LAB;  Service: Cardiovascular;  Laterality: Right;   VENTRICULAR ANEURYSM RESECTION N/A 01/28/2013   Procedure: LEFT VENTRICULAR ANEURYSM REPAIR;  Surgeon: Loreli Slot, MD;  Location: [facility] OR;  Service: Open Heart Surgery;  Laterality: N/A;    Social History:  reports that he has been smoking cigarettes. He has a 35.00 pack-year smoking history. He has never used smokeless tobacco. He reports that he does not drink alcohol and does not use drugs.  Allergies: No Known Allergies  Medications Prior to Admission  Medication Sig Dispense Refill   ALPHAGAN P 0.1 % SOLN INSTILL 1 DROP INTO BOTH EYES TWICE A DAY (Patient taking differently: Place 1 drop into both eyes in the morning and at bedtime.) 10 mL 0   aspirin 81 MG EC tablet Take 1 tablet (81 mg total) by mouth daily. Restart on 07/05/2018 90 tablet 3   atorvastatin (LIPITOR) 80 MG tablet Take 1 tablet (80 mg total) by mouth  daily. 90 tablet 3   carvedilol (COREG) 25 MG tablet Take 0.5 tablets (12.5 mg total) by mouth 2 (two) times daily with a meal. (Patient taking differently: Take 25 mg by mouth 2 (two) times daily with a meal.) 180 tablet 3   Cholecalciferol (VITAMIN D3) 50 MCG (2000 UT) TABS Take 1 tablet by mouth daily.     clopidogrel (PLAVIX) 75 MG tablet Take 1 tablet (75 mg total) by mouth daily. 90 tablet 1   dapagliflozin propanediol (FARXIGA) 10 MG TABS tablet TAKE 1 TABLET EVERY DAY (Patient taking differently: Take 10 mg by mouth daily.) 90 tablet 3   docusate sodium (COLACE) 100 MG capsule Take 100 mg by mouth 2 (two) times daily.     ENTRESTO 97-103 MG TAKE 1 TABLET TWICE DAILY (Patient taking differently: Take 1 tablet by mouth 2 (two) times daily.) 180 tablet 3   furosemide (LASIX) 20 MG tablet Take 1 tablet (20 mg total) by  mouth daily. 90 tablet 3   gabapentin (NEURONTIN) 300 MG capsule Take 1 capsule (300 mg total) by mouth at bedtime. 30 capsule 0   latanoprost (XALATAN) 0.005 % ophthalmic solution Place 1 drop into both eyes at bedtime.  12   Multiple Vitamins-Minerals (CENTRUM MEN) TABS Take 1 tablet by mouth daily.     spironolactone (ALDACTONE) 25 MG tablet Take 0.5 tablets (12.5 mg total) by mouth daily. 45 tablet 1   fluticasone (FLONASE) 50 MCG/ACT nasal spray Place 1 spray into both nostrils daily. 16 g 2   nitroGLYCERIN (NITROSTAT) 0.4 MG SL tablet Place 1 tablet (0.4 mg total) under the tongue every 5 (five) minutes x 3 doses as needed for chest pain. 25 tablet 1    Blood pressure 91/70, pulse 64, temperature (!) 97.5 F (36.4 C), temperature source Oral, resp. rate 20, height 6\' 1"  (1.854 m), weight 95.8 kg, SpO2 94 %. Physical Exam:  General: pleasant, WD, obese male who is laying in bed in NAD HEENT: EOMI. Mouth dry. Cortrak present  Heart: regular, rate, and rhythm.  Lungs: CTAB, no wheezes, rhonchi, or rales noted.  Respiratory effort nonlabored Abd: soft, NT, mild distention, +BS, no masses, hernias, or organomegaly. No abdominal surgical scars  MS: all 4 extremities are symmetrical with no cyanosis, clubbing, or edema. Skin: warm and dry with no masses, lesions, or rashes Neuro: aphasic but nods to some things, following commands for me    Results for orders placed or performed during the hospital encounter of 10/02/22 (from the past 48 hour(s))  Glucose, capillary     Status: Abnormal   Collection Time: 10/21/22  4:17 PM  Result Value Ref Range   Glucose-Capillary 171 (H) 70 - 99 mg/dL    Comment: Glucose reference range applies only to samples taken after fasting for at least 8 hours.  Glucose, capillary     Status: Abnormal   Collection Time: 10/21/22  8:22 PM  Result Value Ref Range   Glucose-Capillary 189 (H) 70 - 99 mg/dL    Comment: Glucose reference range applies only to  samples taken after fasting for at least 8 hours.   Comment 1 Notify RN    Comment 2 Document in Chart   Glucose, capillary     Status: Abnormal   Collection Time: 10/22/22 12:21 AM  Result Value Ref Range   Glucose-Capillary 173 (H) 70 - 99 mg/dL    Comment: Glucose reference range applies only to samples taken after fasting for at least 8 hours.  Comment 1 Notify RN    Comment 2 Document in Chart   Glucose, capillary     Status: Abnormal   Collection Time: 10/22/22  4:01 AM  Result Value Ref Range   Glucose-Capillary 158 (H) 70 - 99 mg/dL    Comment: Glucose reference range applies only to samples taken after fasting for at least 8 hours.   Comment 1 Notify RN    Comment 2 Document in Chart   Heparin level (unfractionated)     Status: Abnormal   Collection Time: 10/22/22  6:21 AM  Result Value Ref Range   Heparin Unfractionated 0.28 (L) 0.30 - 0.70 IU/mL    Comment: (NOTE) The clinical reportable range upper limit is being lowered to >1.10 to align with the FDA approved guidance for the current laboratory assay.  If heparin results are below expected values, and patient dosage has  been confirmed, suggest follow up testing of antithrombin III levels. Performed at [facility], [address], [place], [place] [address]   Glucose, capillary     Status: Abnormal   Collection Time: 10/22/22  8:10 AM  Result Value Ref Range   Glucose-Capillary 131 (H) 70 - 99 mg/dL    Comment: Glucose reference range applies only to samples taken after fasting for at least 8 hours.   Comment 1 Notify RN    Comment 2 Document in Chart   CBC     Status: None   Collection Time: 10/22/22 11:07 AM  Result Value Ref Range   WBC 7.0 4.0 - 10.5 K/uL   RBC 5.06 4.22 - 5.81 MIL/uL   Hemoglobin 15.1 13.0 - 17.0 g/dL   HCT 14.7 82.9 - 56.2 %   MCV 90.3 80.0 - 100.0 fL   MCH 29.8 26.0 - 34.0 pg   MCHC 33.0 30.0 - 36.0 g/dL   RDW 13.0 86.5 - 78.4 %   Platelets 229 150 - 400 K/uL   nRBC 0.0  0.0 - 0.2 %    Comment: Performed at [facility], [address], [place], [place] [address]  Glucose, capillary     Status: Abnormal   Collection Time: 10/22/22 11:15 AM  Result Value Ref Range   Glucose-Capillary 189 (H) 70 - 99 mg/dL    Comment: Glucose reference range applies only to samples taken after fasting for at least 8 hours.   Comment 1 Notify RN    Comment 2 Document in Chart   Glucose, capillary     Status: Abnormal   Collection Time: 10/22/22 12:43 PM  Result Value Ref Range   Glucose-Capillary 173 (H) 70 - 99 mg/dL    Comment: Glucose reference range applies only to samples taken after fasting for at least 8 hours.  Glucose, capillary     Status: Abnormal   Collection Time: 10/22/22  3:42 PM  Result Value Ref Range   Glucose-Capillary 131 (H) 70 - 99 mg/dL    Comment: Glucose reference range applies only to samples taken after fasting for at least 8 hours.   Comment 1 Notify RN    Comment 2 Document in Chart   Glucose, capillary     Status: Abnormal   Collection Time: 10/22/22  8:00 PM  Result Value Ref Range   Glucose-Capillary 184 (H) 70 - 99 mg/dL    Comment: Glucose reference range applies only to samples taken after fasting for at least 8 hours.   Comment 1 Notify RN    Comment 2 Document in  Chart   Glucose, capillary     Status: Abnormal   Collection Time: 10/23/22 12:03 AM  Result Value Ref Range   Glucose-Capillary 181 (H) 70 - 99 mg/dL    Comment: Glucose reference range applies only to samples taken after fasting for at least 8 hours.   Comment 1 Notify RN    Comment 2 Document in Chart   CBC     Status: None   Collection Time: 10/23/22  2:36 AM  Result Value Ref Range   WBC 7.7 4.0 - 10.5 K/uL   RBC 4.86 4.22 - 5.81 MIL/uL   Hemoglobin 14.2 13.0 - 17.0 g/dL   HCT 55.7 32.2 - 02.5 %   MCV 89.1 80.0 - 100.0 fL   MCH 29.2 26.0 - 34.0 pg   MCHC 32.8 30.0 - 36.0 g/dL   RDW 42.7 06.2 - 37.6 %   Platelets 215 150 - 400 K/uL   nRBC 0.0 0.0 -  0.2 %    Comment: Performed at [facility], [address], [place], [place] [address]  Heparin level (unfractionated)     Status: None   Collection Time: 10/23/22  2:36 AM  Result Value Ref Range   Heparin Unfractionated 0.46 0.30 - 0.70 IU/mL    Comment: (NOTE) The clinical reportable range upper limit is being lowered to >1.10 to align with the FDA approved guidance for the current laboratory assay.  If heparin results are below expected values, and patient dosage has  been confirmed, suggest follow up testing of antithrombin III levels. Performed at [facility], [address], [place], [place] [address]   Glucose, capillary     Status: Abnormal   Collection Time: 10/23/22  4:31 AM  Result Value Ref Range   Glucose-Capillary 140 (H) 70 - 99 mg/dL    Comment: Glucose reference range applies only to samples taken after fasting for at least 8 hours.   Comment 1 Notify RN    Comment 2 Document in Chart   Basic metabolic panel     Status: Abnormal   Collection Time: 10/23/22  6:22 AM  Result Value Ref Range   Sodium 133 (L) 135 - 145 mmol/L   Potassium 4.5 3.5 - 5.1 mmol/L   Chloride 97 (L) 98 - 111 mmol/L   CO2 24 22 - 32 mmol/L   Glucose, Bld 181 (H) 70 - 99 mg/dL    Comment: Glucose reference range applies only to samples taken after fasting for at least 8 hours.   BUN 53 (H) 8 - 23 mg/dL   Creatinine, Ser 0.73 0.61 - 1.24 mg/dL   Calcium 9.4 8.9 - 71.0 mg/dL   GFR, Estimated >62 >69 mL/min    Comment: (NOTE) Calculated using the CKD-EPI Creatinine Equation (2021)    Anion gap 12 5 - 15    Comment: Performed at [facility], [address], [place], [place] [address]  Glucose, capillary     Status: Abnormal   Collection Time: 10/23/22  7:52 AM  Result Value Ref Range   Glucose-Capillary 164 (H) 70 - 99 mg/dL    Comment: Glucose reference range applies only to samples taken after fasting for at least 8 hours.   Comment 1 Notify RN    Comment 2  Document in Chart   Glucose, capillary     Status: Abnormal   Collection Time: 10/23/22 11:26 AM  Result Value Ref Range   Glucose-Capillary 234 (H) 70 - 99 mg/dL  Comment: Glucose reference range applies only to samples taken after fasting for at least 8 hours.   Comment 1 Notify RN    Comment 2 Document in Chart    CT ABDOMEN WO CONTRAST  Result Date: 10/22/2022 CLINICAL DATA:  Dysplasia and history of hemorrhagic stroke. Assessment for possible placement of percutaneous gastrostomy tube. EXAM: CT ABDOMEN WITHOUT CONTRAST TECHNIQUE: Multidetector CT imaging of the abdomen was performed following the standard protocol without IV contrast. RADIATION DOSE REDUCTION: This exam was performed according to the departmental dose-optimization program which includes automated exposure control, adjustment of the mA and/or kV according to patient size and/or use of iterative reconstruction technique. COMPARISON:  None Available. FINDINGS: Lower chest: No acute abnormality. Hepatobiliary: No focal liver abnormality is seen. No gallstones, gallbladder wall thickening, or biliary dilatation. Pancreas: Unremarkable. No pancreatic ductal dilatation or surrounding inflammatory changes. Spleen: Normal in size without focal abnormality. Adrenals/Urinary Tract: Adrenal glands are unremarkable. Kidneys are normal, without renal calculi, focal lesion, or hydronephrosis. Stomach/Bowel: No hiatal hernia. A feeding tube traverses the esophagus and stomach and terminates in the proximal duodenum. There is interposition of a dilated transverse colon and splenic flexure between the stomach and abdominal wall with absolutely no percutaneous window for safe gastric access to allow for percutaneous gastrostomy tube placement. The small bowel in the abdomen is nondilated. No free air identified. Vascular/Lymphatic: Atherosclerosis of the abdominal aorta without aneurysm. No lymphadenopathy identified. Other: No hernia, ascites or  focal fluid collections. Musculoskeletal: No acute or significant osseous findings. IMPRESSION: 1. No percutaneous window to allow for safe percutaneous gastrostomy tube placement due to interposition of a dilated transverse colon and splenic flexure between the stomach and abdominal wall. 2. Feeding tube traverses the esophagus and stomach and terminates in the proximal duodenum. 3. Atherosclerosis of the abdominal aorta without aneurysm. Electronically Signed   By: Irish Lack M.D.   On: 10/22/2022 15:48      Assessment/Plan CVA with dysphagia  Consult for PEG placement  - IR saw and did not feel there was a window for percutaneous gastrostomy placement - palliative care following and family desires PEG placement - tolerating TF and having bowel function  - plan for PEG placement in the OR tomorrow - spoke with son by phone and reviewed risks of procedure and he is agreeable to proceed.   FEN: hold TF after MN  VTE: hep gtt - will hold 0400 ID: on ancef for bacteremia  I reviewed Consultant palliative, ID notes, hospitalist notes, last 24 h vitals and pain scores, last 48 h intake and output, last 24 h labs and trends, and last 24 h imaging results.   Juliet Rude, [facility] Surgery 10/23/2022, 1:37 PM Please see Amion for pager number during day hours 7:00am-4:30pm

## 2022-10-23 NOTE — Plan of Care (Signed)
Problem: Education: Goal: Knowledge of disease or condition will improve Outcome: Progressing Goal: Knowledge of secondary prevention will improve (MUST DOCUMENT ALL) Outcome: Progressing Goal: Knowledge of patient specific risk factors will improve [name] N/A or DELETE if not current risk factor) Outcome: Progressing   Problem: Ischemic Stroke/TIA Tissue Perfusion: Goal: Complications of ischemic stroke/TIA will be minimized Outcome: Progressing   Problem: Coping: Goal: Will verbalize positive feelings about self Outcome: Progressing Goal: Will identify appropriate support needs Outcome: Progressing   Problem: Health Behavior/Discharge Planning: Goal: Ability to manage health-related needs will improve Outcome: Progressing Goal: Goals will be collaboratively established with patient/family Outcome: Progressing   Problem: Self-Care: Goal: Ability to participate in self-care as condition permits will improve Outcome: Progressing Goal: Verbalization of feelings and concerns over difficulty with self-care will improve Outcome: Progressing Goal: Ability to communicate needs accurately will improve Outcome: Progressing   Problem: Nutrition: Goal: Risk of aspiration will decrease Outcome: Progressing Goal: Dietary intake will improve Outcome: Progressing   Problem: Education: Goal: Knowledge of General Education information will improve Description: Including pain rating scale, medication(s)/side effects and non-pharmacologic comfort measures Outcome: Progressing   Problem: Health Behavior/Discharge Planning: Goal: Ability to manage health-related needs will improve Outcome: Progressing   Problem: Clinical Measurements: Goal: Ability to maintain clinical measurements within normal limits will improve Outcome: Progressing Goal: Will remain free from infection Outcome: Progressing Goal: Diagnostic test results will improve Outcome: Progressing Goal: Respiratory  complications will improve Outcome: Progressing Goal: Cardiovascular complication will be avoided Outcome: Progressing   Problem: Activity: Goal: Risk for activity intolerance will decrease Outcome: Progressing   Problem: Nutrition: Goal: Adequate nutrition will be maintained Outcome: Progressing   Problem: Coping: Goal: Level of anxiety will decrease Outcome: Progressing   Problem: Elimination: Goal: Will not experience complications related to bowel motility Outcome: Progressing Goal: Will not experience complications related to urinary retention Outcome: Progressing   Problem: Pain Managment: Goal: General experience of comfort will improve Outcome: Progressing   Problem: Safety: Goal: Ability to remain free from injury will improve Outcome: Progressing   Problem: Skin Integrity: Goal: Risk for impaired skin integrity will decrease Outcome: Progressing   Problem: Education: Goal: Understanding of CV disease, CV risk reduction, and recovery process will improve Outcome: Progressing Goal: Individualized Educational Video(s) Outcome: Progressing   Problem: Activity: Goal: Ability to return to baseline activity level will improve Outcome: Progressing   Problem: Cardiovascular: Goal: Ability to achieve and maintain adequate cardiovascular perfusion will improve Outcome: Progressing Goal: Vascular access site(s) Level 0-1 will be maintained Outcome: Progressing   Problem: Health Behavior/Discharge Planning: Goal: Ability to safely manage health-related needs after discharge will improve Outcome: Progressing   Problem: Education: Goal: Ability to describe self-care measures that may prevent or decrease complications (Diabetes Survival Skills Education) will improve Outcome: Progressing Goal: Individualized Educational Video(s) Outcome: Progressing   Problem: Coping: Goal: Ability to adjust to condition or change in health will improve Outcome: Progressing    Problem: Fluid Volume: Goal: Ability to maintain a balanced intake and output will improve Outcome: Progressing   Problem: Health Behavior/Discharge Planning: Goal: Ability to identify and utilize available resources and services will improve Outcome: Progressing Goal: Ability to manage health-related needs will improve Outcome: Progressing   Problem: Metabolic: Goal: Ability to maintain appropriate glucose levels will improve Outcome: Progressing   Problem: Nutritional: Goal: Maintenance of adequate nutrition will improve Outcome: Progressing Goal: Progress toward achieving an optimal weight will improve Outcome: Progressing   Problem: Skin Integrity: Goal: Risk for impaired skin  integrity will decrease Outcome: Progressing   Problem: Tissue Perfusion: Goal: Adequacy of tissue perfusion will improve Outcome: Progressing

## 2022-10-23 NOTE — Progress Notes (Signed)
Physical Therapy Treatment Patient Details Name: [name] MRN: [id number] DOB: [birth year] Today's Date: 10/23/2022   History of Present Illness Patient is a 62 y/o male admitted 10/03/22 with L weakness and numbness.  Received TNK then developed now symptoms of aphasia and R weakness.  He underwent mechanical thrombectomy L M3 segment MCA noted to have small hemorrhage and follow up CT showed progression of hemorrhage 3cm then 7cm and small acute R cerebellar infarct. PMH positive for CAD s/p PCI w/stent and 1v CABG, multiple CGAs, systolic HF s/p ICD, HLD, HTN.    PT Comments  Pt was seen for mobility on side of bed and then to work on R side strengthening with PROM to AROM based on his current tolerance.  Definite inattention to R side at times but did attend to it with exercises.  Wife is encouraging and helps to focus pt to make a productive effort to move.  Recommend him to <3 hours a day rehab as planned, and will encourage up in chair and standing as pt becomes able to tolerate.  Follow acutely for goals of PT as outlined on POC.    Assistance Recommended at Discharge Frequent or constant Supervision/Assistance  If plan is discharge home, recommend the following:  Can travel by private vehicle    Two people to help with walking and/or transfers;A lot of help with bathing/dressing/bathroom;Assistance with cooking/housework;Direct supervision/assist for medications management;Assist for transportation;Help with stairs or ramp for entrance;Assistance with feeding   No  Equipment Recommendations  Other (comment) (TBD)    Recommendations for Other Services Rehab consult     Precautions / Restrictions Precautions Precautions: Fall Precaution Comments: R inattention; SBP <160, cortrak Restrictions Weight Bearing Restrictions: No     Mobility  Bed Mobility Overal bed mobility: Needs Assistance Bed Mobility: Rolling, Supine to Sit, Sit to Supine Rolling: Mod assist   Supine  to sit: Max assist, Total assist Sit to supine: Total assist, Max assist        Transfers Overall transfer level: Needs assistance                 General transfer comment: pt is a total assist lift currently    Ambulation/Gait                   Stairs             Wheelchair Mobility     Tilt Bed    Modified Rankin (Stroke Patients Only)       Balance Overall balance assessment: Needs assistance Sitting-balance support: Feet supported, Bilateral upper extremity supported Sitting balance-Leahy Scale: Fair (once set) Sitting balance - Comments: leaing posteriorly and L due to direction to return to bed Postural control: Left lateral lean, Posterior lean                                  Cognition Arousal/Alertness: Awake/alert Behavior During Therapy: Flat affect Overall Cognitive Status: Difficult to assess Area of Impairment: Attention, Safety/judgement, Awareness, Following commands, Problem solving, Memory                   Current Attention Level: Selective Memory: Decreased short-term memory Following Commands: Follows one step commands with increased time, Follows one step commands inconsistently Safety/Judgement: Decreased awareness of deficits, Decreased awareness of safety Awareness: Intellectual Problem Solving: Slow processing, Requires verbal cues, Requires tactile cues General Comments: memory changes but wife is  talking with him and answering for him        Exercises General Exercises - Upper Extremity Shoulder Flexion: PROM Elbow Flexion: PROM Elbow Extension: PROM General Exercises - Lower Extremity Ankle Circles/Pumps: PROM, Right Quad Sets: AROM, Strengthening, Right Gluteal Sets: AAROM, Right, 10 reps Heel Slides: AAROM, Right, 10 reps Hip ABduction/ADduction: PROM, Right    General Comments General comments (skin integrity, edema, etc.): pt is on side of bed with a great deal of help but wife  walked in and encouraged him to help PT      Pertinent Vitals/Pain Pain Assessment Pain Assessment: Faces Faces Pain Scale: No hurt    Home Living                          Prior Function            PT Goals (current goals can now be found in the care plan section) Acute Rehab PT Goals Patient Stated Goal: to return home    Frequency    Min 3X/week      PT Plan Current plan remains appropriate    Co-evaluation              AM-PAC PT "6 Clicks" Mobility   Outcome Measure  Help needed turning from your back to your side while in a flat bed without using bedrails?: A Lot Help needed moving from lying on your back to sitting on the side of a flat bed without using bedrails?: Total Help needed moving to and from a bed to a chair (including a wheelchair)?: Total Help needed standing up from a chair using your arms (e.g., wheelchair or bedside chair)?: Total Help needed to walk in hospital room?: Total Help needed climbing 3-5 steps with a railing? : Total 6 Click Score: 7    End of Session Equipment Utilized During Treatment: Other (comment) (wrist and R ankle splints) Activity Tolerance: Patient tolerated treatment well Patient left: with call bell/phone within reach;in chair;with chair alarm set;with SCD's reapplied Nurse Communication: Mobility status;Need for lift equipment PT Visit Diagnosis: Other abnormalities of gait and mobility (R26.89);Other symptoms and signs involving the nervous system (R29.898);Hemiplegia and hemiparesis Hemiplegia - Right/Left: Right Hemiplegia - dominant/non-dominant: Dominant Hemiplegia - caused by: Cerebral infarction;Other Nontraumatic intracranial hemorrhage     Time: 8295-6213 PT Time Calculation (min) (ACUTE ONLY): 33 min  Charges:    $Therapeutic Exercise: 8-22 mins $Therapeutic Activity: 8-22 mins PT General Charges $$ ACUTE PT VISIT: 1 Visit            Ivar Drape 10/23/2022, 6:37 PM  Samul Dada, PT  PhD Acute Rehab Dept. Number: [facility] [id number] and [facility] [phone]

## 2022-10-23 NOTE — Progress Notes (Signed)
Sent secure message with Dr. Isidoro Donning, patients BP in the 90/50s, will reduce Entresto dose to 24/26mg  BID   Theotis Burrow, PharmD PGY1 Acute Care Pharmacy Resident  10/23/2022 8:00 AM

## 2022-10-23 NOTE — TOC Progression Note (Signed)
Transition of Care [facility]) - Progression Note    Patient Details  Name: [name] MRN: [id number] Date of Birth: December 17, 1960  Transition of Care [facility]) CM/SW Contact  Baldemar Lenis, [place] Phone Number: 10/23/2022, 11:11 AM  Clinical Narrative:   CSW spoke with patient's son, Stihl, to provide bed offers for SNF. Omarri will review SNF options and determine choice. CSW answered questions. CSW to follow.    Expected Discharge Plan: Skilled Nursing Facility Barriers to Discharge: Continued Medical Work up, English as a second language teacher, SNF Pending bed offer  Expected Discharge Plan and Services In-house Referral: Clinical Social Work     Living arrangements for the past 2 months: Apartment                                       Social Determinants of Health (SDOH) Interventions SDOH Screenings   Food Insecurity: Patient Unable To Answer (10/11/2022)  Housing: Patient Unable To Answer (10/11/2022)  Transportation Needs: Patient Unable To Answer (10/11/2022)  Utilities: Patient Unable To Answer (10/11/2022)  Depression (PHQ2-9): Low Risk  (05/08/2022)  Tobacco Use: High Risk (10/04/2022)    Readmission Risk Interventions     No data to display

## 2022-10-23 NOTE — Progress Notes (Signed)
ANTICOAGULATION CONSULT NOTE - Follow Up Consult  Pharmacy Consult for heparin  Indication: LV pseudoaneurysm / thrombus  No Known Allergies  Patient Measurements: Height: 6\' 1"  (185.4 cm) Weight: 95.8 kg (211 lb 3.2 oz) IBW/kg (Calculated) : 79.9 Heparin Dosing Weight: 101 kg   Vital Signs: Temp: 97.6 F (36.4 C) (07/09 0429) Temp Source: Oral (07/09 0429) BP: 89/69 (07/09 0732) Pulse Rate: 82 (07/09 0732)  Labs: Recent Labs    10/21/22 0548 10/22/22 0621 10/22/22 1107 10/23/22 0236 10/23/22 0622  HGB 14.7  --  15.1 14.2  --   HCT 44.4  --  45.7 43.3  --   PLT 214  --  229 215  --   HEPARINUNFRC 0.35 0.28*  --  0.46  --   CREATININE  --   --   --   --  1.06    Estimated Creatinine Clearance: 82.7 mL/min (by C-G formula based on SCr of 1.06 mg/dL).   Assessment: 50 YOM with hx LV pseudoaneurysm s/p patch now with large thrombus burden, admitted with ischemic stroke with hemorrhagic conversion, CT stable and pharmacy consulted to cautiously start heparin gtt for LV pseudoaneurysm, no bolus with low end goals.     Heparin level therapeutic at 0.46, heparin drip rate 900 units/hr. No bleeding reported / CBC stable   Goal of Therapy:  Heparin level 0.3-0.5 units/ml Monitor platelets by anticoagulation protocol: Yes  Plan:  Continue heparin 900 units/hr  Daily heparin level and CBC Monitor signs and symptoms of bleeding     Thank you for involving pharmacy in the patient's care.   Theotis Burrow, PharmD PGY1 Acute Care Pharmacy Resident  10/23/2022 7:53 AM    **Pharmacist phone directory can be found on [email or web address] listed under [facility].

## 2022-10-23 NOTE — TOC CAGE-AID Note (Signed)
Transition of Care [facility]) - CAGE-AID Screening   Patient Details  Name: [name] MRN: [id number] Date of Birth: 04-17-1960  Transition of Care [facility]) CM/SW Contact:    Leota Sauers, RN Phone Number: 10/23/2022, 12:32 AM   Clinical Narrative:  Patient denies use of alcohol and illicit drugs. Education not offered at this time.  CAGE-AID Screening:    Have You Ever Felt You Ought to Cut Down on Your Drinking or Drug Use?: No Have People Annoyed You By Critizing Your Drinking Or Drug Use?: No Have You Felt Bad Or Guilty About Your Drinking Or Drug Use?: No Have You Ever Had a Drink or Used Drugs First Thing In The Morning to Steady Your Nerves or to Get Rid of a Hangover?: No CAGE-AID Score: 0  Substance Abuse Education Offered: No

## 2022-10-23 NOTE — Progress Notes (Signed)
[facility]                                                                              Susan Hoole, is a 62 y.o. male, DOB - 24-May-1960, ZOX:[id number] Admit date - 10/02/2022    Outpatient Primary MD for the patient is [name], MD  LOS - 21  days  Chief Complaint  Patient presents with   Code Stroke       Brief summary   Patient is a 62 year old male with tobacco use,CAD s/p CABG, HFrEF/ HFpEF, ICM w/ICD (MRI compatible), myopericarditis/ dresslers syndrome (2014), HTN, HLD, and multiple CVAs initially presented on 6/18 with left hemiplegia, dysarthria, dizziness, and facial droop. Initial CTH negative, treated w/TNK but subsequently later developed right sided deficits found to have left M3 occlusion s/p NIR on 6/19 w/ successful thrombectomy, however complicated by IPH (left frontoparietal) and SAH.  Developed fever 6/19 without leukocytosis with initial negative CXR, UA, and PCT. Ceftriaxone empirically started 6/25. Found to have large inferior, thrombus filled, pseudoaneurysm with EF 20-25% (prior 25-30% 2022) on TTE with concern for dehiscence of prior pseudoaneurysm patch in 2014 and source of strokes to be embolic. Cardiology consulted 6/23, anticoagulation recommended but deferred given recent IPH and SAH.  On 6/26, blood cultures positive for MSSE. ID consulted 6/27, ceftriaxone switched to cefazolin. Patient continued to have fevers despite abx with increasing leukocytosis on 6/28.  EP consulted for possible ICD extraction given bacteremia but not a candidate at this time.  Heparin gtt started 6/28. Repeat blood cultures 6/27 remain NGTD x 3 days. CTH remains stable 6/29 without neuro changes.  Patient transferred out of neuro ICU, TRH assumed care on 7/3.    7/8: GOC, family requested PEG tube  Assessment & Plan    Right brain stroke aborted s/p TNK and later that day second stroke with left M2 occlusion s/p IR Left MCA and right cerebellar  infarcts left M3 occlusion s/p IR reperfusion complicated by L frontoparietal ICH and SAH, etiology likely cardioembolic source  -CT 6/18 on presentation no acute finding, subsequent bilateral frontal, parietal, occipital and cerebellum infarcts -CTA head and neck unremarkable.  Repeat CTA head and neck showed news short segment occlusion of distal left M2 status post IR reperfusion complicated by left frontoparietal ICH and SAH -Serial CT head repeated.  CT head 6/28 showed stable hematoma, SAH and IVH have progressed since 10/03/2022 -2D echo showed Large inferior pseudoaneurysm with a depth of 3 cm and a maximum width of roughly 5 cm the wall of the pseudoaneurysm is made above the layer of thrombus that is roughly 1.5 cm thick. EF 30 to 35%, severe hypokinesis of left ventricle. -Lower extremity venous Dopplers negative for DVT -TCD bubble study showed moderate-sized PFO -LDL 63, hemoglobin A1c 5.8 -Prior to admission aspirin 81 mg daily and Plavix 75 mg daily, now on IV heparin per stroke protocol.  -N.p.o. secondary to dysphagia, on tube feeds via cortrack, reevaluated by SLP on 7/3, worsening swallow function.   -discussed with Dr. Pearlean Brownie on 7/6, recommended eliquis once the PEG tube is placed. Aspirin if needed for CAD. - Palliative  medicine following, GOC on 7/8, family wants to pursue PEG tube. IR consult was placed - IR requested surgery consult for PEG placement due to anatomy issues, consulted surgery.    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 -Per ID, TEE when able, to assess in light of bacteremia  associated with fever -Repeat blood cultures remain negative, no new fevers or leukocytosis  -Continue IV cefazolin, ID following   Acute on chronic systolic CHF exacerbation -Goal-directed medical therapy for his heart failure per cardiology team -2D echo 6/23 showed EF 30 to 35%, severe global hypokinesis and wall motion abnormality.  -Patient was on IV Lasix/albumin, now transitioned to Lasix via cortrack     Acute hypoxemic respiratory failure -combination of fluid overload and poor pulmonary toileting.  -Stable, O2 sats 94% on room air    HTN, HLD --Continue statin, carvedilol, lasix, metoprolol, entresto, and spironolactone      Dysphagia -continue TF with Osmolite at 34ml/hr and PROsource 60ml daily.  -Appreciate SLP recs, repeat evaluation on 7/3 with worsening swallowing function, continue n.p.o. -palliative medicine consult for GOC, currently receiving tube feeds, ? PEG tube  Estimated body mass index is 27.86 kg/m as calculated from the following:   Height as of this encounter: 6\' 1"  (1.854 m).   Weight as of this encounter: 95.8 kg.  Code Status: Full code DVT Prophylaxis:  SCDs  Level of Care: Level of care: Telemetry Medical Family Communication: No family at the bedside Disposition Plan:      Remains inpatient appropriate: May be a candidate for LTAC after PEG tube placement  Antimicrobials:   Anti-infectives (From admission, onward)    Start     Dose/Rate Route Frequency Ordered Stop   10/11/22 1515  cefTRIAXone (ROCEPHIN) 2 g in sodium chloride 0.9 % 100 mL IVPB  Status:  Discontinued        2 g 200 mL/hr over 30 Minutes Intravenous Every 24 hours 10/10/22 1934 10/10/22 1939   10/11/22 1515  cefTRIAXone (ROCEPHIN) 2 g in sodium chloride 0.9 % 100 mL IVPB  Status:  Discontinued        2 g 200 mL/hr over 30 Minutes Intravenous Every 24 hours 10/10/22 1939 10/11/22 0857   10/11/22 0945  ceFAZolin (ANCEF) IVPB 2g/100 mL premix        2 g 200 mL/hr over 30  Minutes Intravenous Every 8 hours 10/11/22 0857     10/10/22 2030  cefTRIAXone (ROCEPHIN) 1 g in sodium chloride 0.9 % 100 mL IVPB  Status:  Discontinued        1 g 200 mL/hr over 30 Minutes Intravenous  Once 10/10/22 1934 10/10/22 1939   10/10/22 2030  cefTRIAXone (ROCEPHIN) 1 g in sodium chloride 0.9 % 100 mL IVPB        1 g 200 mL/hr over 30 Minutes Intravenous  Once 10/10/22 1939 10/10/22 2158   10/09/22 1428  cefTRIAXone (ROCEPHIN) 1 g in sodium chloride 0.9 % 100 mL IVPB  Status:  Discontinued        1 g 200 mL/hr over 30 Minutes Intravenous Every 24 hours 10/09/22 1428 10/10/22 1934   10/09/22 1145  cefTRIAXone (ROCEPHIN) 1 g  in sodium chloride 0.9 % 100 mL IVPB  Status:  Discontinued        1 g 200 mL/hr over 30 Minutes Intravenous Every 24 hours 10/09/22 1048 10/09/22 1428          Medications  atorvastatin  80 mg Per Tube Daily   carvedilol  12.5 mg Per Tube BID WC   Chlorhexidine Gluconate Cloth  6 each Topical Daily   famotidine  20 mg Per Tube BID   feeding supplement (PROSource TF20)  60 mL Per Tube Daily   fiber supplement (BANATROL TF)  60 mL Per Tube BID   furosemide  40 mg Per Tube BID   gabapentin  300 mg Per Tube QHS   insulin aspart  0-20 Units Subcutaneous Q4H   multivitamin with minerals  1 tablet Per Tube Daily   nicotine  7 mg Transdermal Daily   polyethylene glycol  17 g Per Tube BID   sacubitril-valsartan  1 tablet Per Tube BID   spironolactone  12.5 mg Per Tube Daily      Subjective:   Lael Garhart was seen and examined today.  Alert and awake, overnight no acute issues. Core track+  Objective:   Vitals:   10/23/22 0429 10/23/22 0552 10/23/22 0732 10/23/22 1125  BP: 94/66  (!) 89/69 91/70  Pulse: 81  82 64  Resp: 18  19 20   Temp: 97.6 F (36.4 C)  97.9 F (36.6 C) (!) 97.5 F (36.4 C)  TempSrc: Oral  Oral Oral  SpO2: 98%  97% 94%  Weight:  95.8 kg    Height:        Intake/Output Summary (Last 24 hours) at 10/23/2022 1348 Last  data filed at 10/23/2022 0800 Gross per 24 hour  Intake 639.29 ml  Output 1600 ml  Net -960.71 ml     Wt Readings from Last 3 Encounters:  10/23/22 95.8 kg  08/06/22 106.1 kg  07/23/22 105.7 kg    Physical Exam General: Alert and awake, NAD, expressive aphasia, core track+  Cardiovascular: S1 S2 clear, RRR.  Respiratory: CTAB, no wheezing Gastrointestinal: Soft, nontender, nondistended, NBS Ext: no pedal edema bilaterally Neuro: no new deficits  Data Reviewed:  I have personally reviewed following labs    CBC Lab Results  Component Value Date   WBC 7.7 10/23/2022   RBC 4.86 10/23/2022   HGB 14.2 10/23/2022   HCT 43.3 10/23/2022   MCV 89.1 10/23/2022   MCH 29.2 10/23/2022   PLT 215 10/23/2022   MCHC 32.8 10/23/2022   RDW 13.0 10/23/2022   LYMPHSABS 2.6 10/02/2022   MONOABS 0.7 10/02/2022   EOSABS 0.1 10/02/2022   BASOSABS 0.1 10/02/2022     Last metabolic panel Lab Results  Component Value Date   NA 133 (L) 10/23/2022   K 4.5 10/23/2022   CL 97 (L) 10/23/2022   CO2 24 10/23/2022   BUN 53 (H) 10/23/2022   CREATININE 1.06 10/23/2022   GLUCOSE 181 (H) 10/23/2022   GFRNONAA >60 10/23/2022   GFRAA 92 05/06/2020   CALCIUM 9.4 10/23/2022   PHOS 3.9 10/16/2022   PROT 6.5 10/09/2022   ALBUMIN 3.1 (L) 10/09/2022   LABGLOB 2.4 05/25/2021   AGRATIO 1.8 05/25/2021   BILITOT 1.9 (H) 10/09/2022   ALKPHOS 82 10/09/2022   AST 36 10/09/2022   ALT 24 10/09/2022   ANIONGAP 12 10/23/2022    CBG (last 3)  Recent Labs    10/23/22 0431 10/23/22 0752 10/23/22 1126  GLUCAP 140* 164*  234*      Coagulation Profile: No results for input(s): "INR", "PROTIME" in the last 168 hours.   Radiology Studies: I have personally reviewed the imaging studies  CT ABDOMEN WO CONTRAST  Result Date: 10/22/2022 CLINICAL DATA:  Dysplasia and history of hemorrhagic stroke. Assessment for possible placement of percutaneous gastrostomy tube. EXAM: CT ABDOMEN WITHOUT CONTRAST  TECHNIQUE: Multidetector CT imaging of the abdomen was performed following the standard protocol without IV contrast. RADIATION DOSE REDUCTION: This exam was performed according to the departmental dose-optimization program which includes automated exposure control, adjustment of the mA and/or kV according to patient size and/or use of iterative reconstruction technique. COMPARISON:  None Available. FINDINGS: Lower chest: No acute abnormality. Hepatobiliary: No focal liver abnormality is seen. No gallstones, gallbladder wall thickening, or biliary dilatation. Pancreas: Unremarkable. No pancreatic ductal dilatation or surrounding inflammatory changes. Spleen: Normal in size without focal abnormality. Adrenals/Urinary Tract: Adrenal glands are unremarkable. Kidneys are normal, without renal calculi, focal lesion, or hydronephrosis. Stomach/Bowel: No hiatal hernia. A feeding tube traverses the esophagus and stomach and terminates in the proximal duodenum. There is interposition of a dilated transverse colon and splenic flexure between the stomach and abdominal wall with absolutely no percutaneous window for safe gastric access to allow for percutaneous gastrostomy tube placement. The small bowel in the abdomen is nondilated. No free air identified. Vascular/Lymphatic: Atherosclerosis of the abdominal aorta without aneurysm. No lymphadenopathy identified. Other: No hernia, ascites or focal fluid collections. Musculoskeletal: No acute or significant osseous findings. IMPRESSION: 1. No percutaneous window to allow for safe percutaneous gastrostomy tube placement due to interposition of a dilated transverse colon and splenic flexure between the stomach and abdominal wall. 2. Feeding tube traverses the esophagus and stomach and terminates in the proximal duodenum. 3. Atherosclerosis of the abdominal aorta without aneurysm. Electronically Signed   By: Irish Lack M.D.   On: 10/22/2022 15:48       Ashford Clouse  M.D. [facility] 10/23/2022, 1:48 PM  Available via Epic secure chat 7am-7pm After 7 pm, please refer to night coverage provider listed on amion.

## 2022-10-23 NOTE — Anesthesia Preprocedure Evaluation (Signed)
Anesthesia Evaluation  Patient identified by MRN, date of birth, ID band Patient awake    Reviewed: Allergy & Precautions, NPO status , Patient's Chart, lab work & pertinent test results, reviewed documented beta blocker date and time   History of Anesthesia Complications Negative for: history of anesthetic complications  Airway Mallampati: II  TM Distance: >3 FB Neck ROM: Full    Dental  (+) Edentulous Upper, Edentulous Lower   Pulmonary Current Smoker and Patient abstained from smoking.   breath sounds clear to auscultation       Cardiovascular hypertension, Pt. on medications and Pt. on home beta blockers (-) angina + CAD, + Cardiac Stents and + CABG  + Cardiac Defibrillator  Rhythm:Regular Rate:Normal  10/07/2022 ECHO:EF 30-35%, pseudoaneurysm of basal inferior segment, post, ant, inferior akinesis   Neuro/Psych TIACVA (L arm weakness, R sided weakness, aphasia), Residual Symptoms    GI/Hepatic Neg liver ROS,,,dysphagia   Endo/Other  negative endocrine ROS    Renal/GU negative Renal ROS     Musculoskeletal   Abdominal   Peds  Hematology negative hematology ROS (+)   Anesthesia Other Findings   Reproductive/Obstetrics                             Anesthesia Physical Anesthesia Plan  ASA: 4  Anesthesia Plan: General   Post-op Pain Management: Ofirmev IV (intra-op)*   Induction: Intravenous and Rapid sequence  PONV Risk Score and Plan: 1 and Ondansetron  Airway Management Planned: Oral ETT  Additional Equipment:   Intra-op Plan:   Post-operative Plan: Extubation in OR  Informed Consent: I have reviewed the patients History and Physical, chart, labs and discussed the procedure including the risks, benefits and alternatives for the proposed anesthesia with the patient or authorized representative who has indicated his/her understanding and acceptance.   Patient has DNR.   Discussed DNR with patient, Discussed DNR with power of attorney and Suspend DNR.     Plan Discussed with: CRNA and Surgeon  Anesthesia Plan Comments: (Discussed with pt's son telephone, pt in person)        Anesthesia Quick Evaluation

## 2022-10-23 NOTE — Progress Notes (Addendum)
Occupational Therapy Treatment Patient Details Name: [name] MRN: [id number] DOB: 10-09-1960 Today's Date: 10/23/2022   History of present illness Patient is a 62 y/o male admitted 10/03/22 with L weakness and numbness.  Received TNK then developed now symptoms of aphasia and R weakness.  He underwent mechanical thrombectomy L M3 segment MCA noted to have small hemorrhage and follow up CT showed progression of hemorrhage 3cm then 7cm and small acute R cerebellar infarct. PMH positive for CAD s/p PCI w/stent and 1v CABG, multiple CGAs, systolic HF s/p ICD, HLD, HTN.   OT comments  Pt with slower progress towards OT goals, downgraded accordingly. Pt continues to be limited by R sided weakness, impaired communication and cognition. Pt demonstrating some movement of RUE though difficulty in performing tasks on command with RUE. Pt able to engage in various AAROM exercises for RUE w/ assist as well as long sitting w/ Mod A in bed. Noted pt had worn R resting hand splint overnight, no skin issues noted. Discussed with pt, nursing and MD on plans to update orders to nighttime wear only to allow free movement of RUE during the day.   Recommendations for follow up therapy are one component of a multi-disciplinary discharge planning process, led by the attending physician.  Recommendations may be updated based on patient status, additional functional criteria and insurance authorization.    Assistance Recommended at Discharge Frequent or constant Supervision/Assistance  Patient can return home with the following  Two people to help with walking and/or transfers;Two people to help with bathing/dressing/bathroom;Assistance with cooking/housework;Assistance with feeding;Help with stairs or ramp for entrance;Direct supervision/assist for financial management;Assist for transportation;Direct supervision/assist for medications management   Equipment Recommendations   (TBD)    Recommendations for Other  Services      Precautions / Restrictions Precautions Precautions: Fall Precaution Comments: R inattention; SBP <160, cortrak Restrictions Weight Bearing Restrictions: No       Mobility Bed Mobility Overal bed mobility: Needs Assistance             General bed mobility comments: Heavy Mod A for long sitting exercises using L UE on bedrail - only able to hold briefly    Transfers                         Balance                                           ADL either performed or assessed with clinical judgement   ADL Overall ADL's : Needs assistance/impaired     Grooming: Moderate assistance;Bed level;Wash/dry face Grooming Details (indicate cue type and reason): cued to attempt washing face with pt variably attempting to use L vs R UE though inconsistent with cues                                    Extremity/Trunk Assessment Upper Extremity Assessment Upper Extremity Assessment: RUE deficits/detail RUE Deficits / Details: able to  make fist and extend digits partially actively. able to lift UE from bed to abdomen but difficulty replicating movements on command RUE Coordination: decreased fine motor;decreased gross motor   Lower Extremity Assessment Lower Extremity Assessment: Defer to PT evaluation        Vision   Vision Assessment?: Vision impaired-  to be further tested in functional context Additional Comments: question some inattention   Perception     Praxis      Cognition Arousal/Alertness: Awake/alert Behavior During Therapy: Flat affect Overall Cognitive Status: Difficult to assess Area of Impairment: Attention, Safety/judgement, Awareness, Following commands, Problem solving, Memory                   Current Attention Level: Focused Memory: Decreased short-term memory Following Commands: Follows one step commands inconsistently, Follows one step commands with increased time Safety/Judgement:  Decreased awareness of safety, Decreased awareness of deficits Awareness: Intellectual Problem Solving: Slow processing General Comments: able to answer yes/no questions but memory deficits as pt unsure how long he has had splint on. Pt also with inconsistent command following. when cued to perform tasks with R UE, pt often performing with LUE        Exercises Exercises: Other exercises Other Exercises Other Exercises: R hand flexion/extension Other Exercises: AAROM all joints of RUE Other Exercises: AAROM bringing R UE from bed to abdomen Other Exercises: long sitting x 5 using L bedrail (only able to hold 3 sec)    Shoulder Instructions       General Comments Discussing resting hand splint with RN and NT. Per RN, splint was on pt when day shift came in. updating orders to nighttime wear as no skin issues noted on OT assessment    Pertinent Vitals/ Pain       Pain Assessment Pain Assessment: Faces Faces Pain Scale: No hurt  Home Living                                          Prior Functioning/Environment              Frequency  Min 2X/week        Progress Toward Goals  OT Goals(current goals can now be found in the care plan section)  Progress towards OT goals: OT to reassess next treatment  Acute Rehab OT Goals OT Goal Formulation: Patient unable to participate in goal setting Time For Goal Achievement: 11/06/22 Potential to Achieve Goals: Fair  Plan Discharge plan remains appropriate    Co-evaluation                 AM-PAC OT "6 Clicks" Daily Activity     Outcome Measure   Help from another person eating meals?: Total Help from another person taking care of personal grooming?: A Lot Help from another person toileting, which includes using toliet, bedpan, or urinal?: Total Help from another person bathing (including washing, rinsing, drying)?: Total Help from another person to put on and taking off regular upper body  clothing?: Total Help from another person to put on and taking off regular lower body clothing?: Total 6 Click Score: 7    End of Session    OT Visit Diagnosis: Unsteadiness on feet (R26.81);Other abnormalities of gait and mobility (R26.89);Muscle weakness (generalized) (M62.81);Cognitive communication deficit (R41.841);Hemiplegia and hemiparesis;Low vision, both eyes (H54.2);Apraxia (R48.2);Other symptoms and signs involving cognitive function Symptoms and signs involving cognitive functions: Cerebral infarction Hemiplegia - Right/Left: Right Hemiplegia - dominant/non-dominant: Dominant Hemiplegia - caused by: Cerebral infarction   Activity Tolerance Patient tolerated treatment well   Patient Left in bed;with call bell/phone within reach;with bed alarm set   Nurse Communication Mobility status        Time: 1610-9604 OT Time  Calculation (min): 24 min  Charges: OT General Charges $OT Visit: 1 Visit OT Treatments $Therapeutic Activity: 8-22 mins $Therapeutic Exercise: 8-22 mins  Bradd Canary, OTR/L Acute Rehab Services Office: [phone]   Lorre Munroe 10/23/2022, 11:11 AM

## 2022-10-24 ENCOUNTER — Other Ambulatory Visit: Payer: Self-pay

## 2022-10-24 ENCOUNTER — Inpatient Hospital Stay (HOSPITAL_COMMUNITY): Payer: Medicare PPO | Admitting: Anesthesiology

## 2022-10-24 ENCOUNTER — Encounter (HOSPITAL_COMMUNITY): Payer: Self-pay | Admitting: Neurology

## 2022-10-24 ENCOUNTER — Encounter (HOSPITAL_COMMUNITY)
Admission: EM | Disposition: A | Discharge status: Skilled Nursing Facility | Payer: Self-pay | Source: Home / Self Care | Attending: Neurology

## 2022-10-24 DIAGNOSIS — I253 Aneurysm of heart: Secondary | ICD-10-CM | POA: Diagnosis not present

## 2022-10-24 DIAGNOSIS — I5023 Acute on chronic systolic (congestive) heart failure: Secondary | ICD-10-CM | POA: Diagnosis not present

## 2022-10-24 DIAGNOSIS — F17210 Nicotine dependence, cigarettes, uncomplicated: Secondary | ICD-10-CM | POA: Diagnosis not present

## 2022-10-24 DIAGNOSIS — I5022 Chronic systolic (congestive) heart failure: Secondary | ICD-10-CM

## 2022-10-24 DIAGNOSIS — D696 Thrombocytopenia, unspecified: Secondary | ICD-10-CM | POA: Insufficient documentation

## 2022-10-24 DIAGNOSIS — J9601 Acute respiratory failure with hypoxia: Secondary | ICD-10-CM

## 2022-10-24 DIAGNOSIS — R1310 Dysphagia, unspecified: Secondary | ICD-10-CM | POA: Diagnosis not present

## 2022-10-24 DIAGNOSIS — I110 Hypertensive heart disease with heart failure: Secondary | ICD-10-CM

## 2022-10-24 DIAGNOSIS — J690 Pneumonitis due to inhalation of food and vomit: Secondary | ICD-10-CM | POA: Insufficient documentation

## 2022-10-24 DIAGNOSIS — I63413 Cerebral infarction due to embolism of bilateral middle cerebral arteries: Secondary | ICD-10-CM | POA: Diagnosis not present

## 2022-10-24 DIAGNOSIS — R7881 Bacteremia: Secondary | ICD-10-CM | POA: Diagnosis not present

## 2022-10-24 DIAGNOSIS — G936 Cerebral edema: Secondary | ICD-10-CM | POA: Insufficient documentation

## 2022-10-24 HISTORY — PX: LAPAROSCOPY: SHX197

## 2022-10-24 HISTORY — PX: PEG PLACEMENT: SHX5437

## 2022-10-24 LAB — GLUCOSE, CAPILLARY
Glucose-Capillary: 105 mg/dL — ABNORMAL HIGH (ref 70–99)
Glucose-Capillary: 131 mg/dL — ABNORMAL HIGH (ref 70–99)
Glucose-Capillary: 150 mg/dL — ABNORMAL HIGH (ref 70–99)
Glucose-Capillary: 152 mg/dL — ABNORMAL HIGH (ref 70–99)
Glucose-Capillary: 156 mg/dL — ABNORMAL HIGH (ref 70–99)
Glucose-Capillary: 233 mg/dL — ABNORMAL HIGH (ref 70–99)

## 2022-10-24 LAB — COMPREHENSIVE METABOLIC PANEL
ALT: 19 U/L (ref 0–44)
AST: 27 U/L (ref 15–41)
Albumin: 3.6 g/dL (ref 3.5–5.0)
Alkaline Phosphatase: 104 U/L (ref 38–126)
Anion gap: 11 (ref 5–15)
BUN: 55 mg/dL — ABNORMAL HIGH (ref 8–23)
CO2: 25 mmol/L (ref 22–32)
Calcium: 9.6 mg/dL (ref 8.9–10.3)
Chloride: 100 mmol/L (ref 98–111)
Creatinine, Ser: 1.15 mg/dL (ref 0.61–1.24)
GFR, Estimated: >60 mL/min (ref >60)
Glucose, Bld: 107 mg/dL — ABNORMAL HIGH (ref 70–99)
Potassium: 5 mmol/L (ref 3.5–5.1)
Sodium: 136 mmol/L (ref 135–145)
Total Bilirubin: 0.7 mg/dL (ref 0.3–1.2)
Total Protein: 7.4 g/dL (ref 6.5–8.1)

## 2022-10-24 LAB — CBC
HCT: 42.7 % (ref 39.0–52.0)
Hemoglobin: 14 g/dL (ref 13.0–17.0)
MCH: 29.3 pg (ref 26.0–34.0)
MCHC: 32.8 g/dL (ref 30.0–36.0)
MCV: 89.3 fL (ref 80.0–100.0)
Platelets: 220 10*3/uL (ref 150–400)
RBC: 4.78 MIL/uL (ref 4.22–5.81)
RDW: 12.9 % (ref 11.5–15.5)
WBC: 8.6 10*3/uL (ref 4.0–10.5)
nRBC: 0 % (ref 0.0–0.2)

## 2022-10-24 SURGERY — INSERTION, PEG TUBE
Anesthesia: General

## 2022-10-24 MED ORDER — DEXAMETHASONE SODIUM PHOSPHATE 10 MG/ML IJ SOLN
INTRAMUSCULAR | Status: DC | PRN
  Administered 2022-10-24: 10 mg via INTRAVENOUS

## 2022-10-24 MED ORDER — LIDOCAINE 2% (20 MG/ML) 5 ML SYRINGE
INTRAMUSCULAR | Status: DC | PRN
  Administered 2022-10-24: 20 mg via INTRAVENOUS

## 2022-10-24 MED ORDER — OXYCODONE HCL 5 MG/5ML PO SOLN: 5.0000 mg | Freq: Once | ORAL | Status: DC | PRN

## 2022-10-24 MED ORDER — 0.9 % SODIUM CHLORIDE (POUR BTL) OPTIME
TOPICAL | Status: DC | PRN
  Administered 2022-10-24: 1000 mL

## 2022-10-24 MED ORDER — EPHEDRINE SULFATE-NACL 50-0.9 MG/10ML-% IV SOSY
PREFILLED_SYRINGE | INTRAVENOUS | Status: DC | PRN
  Administered 2022-10-24 (×3): 5 mg via INTRAVENOUS

## 2022-10-24 MED ORDER — ONDANSETRON HCL 4 MG/2ML IJ SOLN
INTRAMUSCULAR | Status: DC | PRN
  Administered 2022-10-24: 4 mg via INTRAVENOUS

## 2022-10-24 MED ORDER — SUGAMMADEX SODIUM 200 MG/2ML IV SOLN
INTRAVENOUS | Status: DC | PRN
  Administered 2022-10-24: 200 mg via INTRAVENOUS

## 2022-10-24 MED ORDER — ORAL CARE MOUTH RINSE: 15.0000 mL | Freq: Once | OROMUCOSAL | Status: AC

## 2022-10-24 MED ORDER — CEFAZOLIN SODIUM-DEXTROSE 2-3 GM-%(50ML) IV SOLR
INTRAVENOUS | Status: DC | PRN
  Administered 2022-10-24: 2 g via INTRAVENOUS

## 2022-10-24 MED ORDER — PROMETHAZINE HCL 25 MG/ML IJ SOLN: 6.2500 mg | INTRAMUSCULAR | Status: DC | PRN

## 2022-10-24 MED ORDER — FENTANYL CITRATE (PF) 250 MCG/5ML IJ SOLN
INTRAMUSCULAR | Status: AC
  Filled 2022-10-24: qty 5

## 2022-10-24 MED ORDER — PHENYLEPHRINE 80 MCG/ML (10ML) SYRINGE FOR IV PUSH (FOR BLOOD PRESSURE SUPPORT)
PREFILLED_SYRINGE | INTRAVENOUS | Status: DC | PRN
  Administered 2022-10-24 (×2): 160 ug via INTRAVENOUS

## 2022-10-24 MED ORDER — JEVITY 1.5 CAL/FIBER PO LIQD
237.0000 mL | Freq: Every day | ORAL | Status: AC
  Administered 2022-10-25 (×2): 237 mL
  Filled 2022-10-24 (×2): qty 237

## 2022-10-24 MED ORDER — PHENYLEPHRINE HCL-NACL 20-0.9 MG/250ML-% IV SOLN
INTRAVENOUS | Status: DC | PRN
  Administered 2022-10-24: 25 ug/min via INTRAVENOUS

## 2022-10-24 MED ORDER — JEVITY 1.5 CAL/FIBER PO LIQD
120.0000 mL | Freq: Every day | ORAL | Status: AC
  Administered 2022-10-25 (×2): 120 mL
  Filled 2022-10-24 (×2): qty 237

## 2022-10-24 MED ORDER — OXYCODONE HCL 5 MG PO TABS: 5.0000 mg | ORAL_TABLET | Freq: Once | ORAL | Status: DC | PRN

## 2022-10-24 MED ORDER — STERILE WATER FOR IRRIGATION IR SOLN
Status: DC | PRN
  Administered 2022-10-24: 1000 mL

## 2022-10-24 MED ORDER — JEVITY 1.5 CAL/FIBER PO LIQD
275.0000 mL | Freq: Every day | ORAL | Status: DC
  Administered 2022-10-25 – 2022-10-29 (×21): 275 mL
  Filled 2022-10-24 (×26): qty 474

## 2022-10-24 MED ORDER — PROPOFOL 10 MG/ML IV BOLUS
INTRAVENOUS | Status: AC
  Filled 2022-10-24: qty 20

## 2022-10-24 MED ORDER — OSMOLITE 1.5 CAL PO LIQD
1000.0000 mL | ORAL | Status: DC
  Administered 2022-10-24: 1000 mL

## 2022-10-24 MED ORDER — FREE WATER
60.0000 mL | Freq: Every day | Status: DC
  Administered 2022-10-24 – 2022-10-29 (×27): 60 mL

## 2022-10-24 MED ORDER — ALBUMIN HUMAN 5 % IV SOLN: INTRAVENOUS | Status: DC | PRN

## 2022-10-24 MED ORDER — FENTANYL CITRATE (PF) 250 MCG/5ML IJ SOLN
INTRAMUSCULAR | Status: DC | PRN
  Administered 2022-10-24 (×2): 50 ug via INTRAVENOUS

## 2022-10-24 MED ORDER — OSMOLITE 1.5 CAL PO LIQD: 1000.0000 mL | ORAL | Status: DC

## 2022-10-24 MED ORDER — BANATROL TF EN LIQD: 60.0000 mL | Freq: Two times a day (BID) | ENTERAL | Status: DC

## 2022-10-24 MED ORDER — ETOMIDATE 2 MG/ML IV SOLN
INTRAVENOUS | Status: DC | PRN
  Administered 2022-10-24: 16 mg via INTRAVENOUS

## 2022-10-24 MED ORDER — LACTATED RINGERS IV SOLN: INTRAVENOUS | Status: DC

## 2022-10-24 MED ORDER — MEPERIDINE HCL 25 MG/ML IJ SOLN: 6.2500 mg | INTRAMUSCULAR | Status: DC | PRN

## 2022-10-24 MED ORDER — CHLORHEXIDINE GLUCONATE 0.12 % MT SOLN
15.0000 mL | Freq: Once | OROMUCOSAL | Status: AC
  Administered 2022-10-24: 15 mL via OROMUCOSAL

## 2022-10-24 MED ORDER — HEPARIN (PORCINE) 25000 UT/250ML-% IV SOLN
900.0000 [IU]/h | INTRAVENOUS | Status: AC
  Administered 2022-10-24: 900 [IU]/h via INTRAVENOUS
  Filled 2022-10-24 (×2): qty 250

## 2022-10-24 MED ORDER — MIDAZOLAM HCL 2 MG/2ML IJ SOLN: 0.5000 mg | Freq: Once | INTRAMUSCULAR | Status: DC | PRN

## 2022-10-24 MED ORDER — ROCURONIUM BROMIDE 10 MG/ML (PF) SYRINGE
PREFILLED_SYRINGE | INTRAVENOUS | Status: DC | PRN
  Administered 2022-10-24: 50 mg via INTRAVENOUS
  Administered 2022-10-24: 30 mg via INTRAVENOUS

## 2022-10-24 MED ORDER — FENTANYL CITRATE (PF) 100 MCG/2ML IJ SOLN: 25.0000 ug | INTRAMUSCULAR | Status: DC | PRN

## 2022-10-24 SURGICAL SUPPLY — 42 items
ADH SKN CLS APL DERMABOND .7 (GAUZE/BANDAGES/DRESSINGS) ×1
ADH SKN CLS LQ APL DERMABOND (GAUZE/BANDAGES/DRESSINGS) ×1
APL PRP STRL LF DISP 70% ISPRP (MISCELLANEOUS) ×1
BAG COUNTER SPONGE SURGICOUNT (BAG) ×2 IMPLANT
BAG SPNG CNTER NS LX DISP (BAG)
BINDER ABDOMINAL 12 ML 46-62 (SOFTGOODS) IMPLANT
BLADE CLIPPER SURG (BLADE) IMPLANT
BLOCK BITE 60FR ADLT L/F BLUE (MISCELLANEOUS) ×2 IMPLANT
BUTTON OLYMPUS DEFENDO 5 PIECE (MISCELLANEOUS) ×2 IMPLANT
CANISTER SUCT 3000ML PPV (MISCELLANEOUS) ×2 IMPLANT
CHLORAPREP W/TINT 26 (MISCELLANEOUS) ×2 IMPLANT
COVER SURGICAL LIGHT HANDLE (MISCELLANEOUS) ×2 IMPLANT
DERMABOND ADVANCED .7 DNX12 (GAUZE/BANDAGES/DRESSINGS) IMPLANT
DERMABOND ADVANCED .7 DNX6 (GAUZE/BANDAGES/DRESSINGS) ×2 IMPLANT
ELECT REM PT RETURN 9FT ADLT (ELECTROSURGICAL) ×1
ELECTRODE REM PT RTRN 9FT ADLT (ELECTROSURGICAL) ×2 IMPLANT
GLOVE BIO SURGEON STRL SZ 6.5 (GLOVE) ×2 IMPLANT
GLOVE BIOGEL PI IND STRL 6 (GLOVE) ×2 IMPLANT
GOWN STRL REUS W/ TWL LRG LVL3 (GOWN DISPOSABLE) ×4 IMPLANT
GOWN STRL REUS W/TWL LRG LVL3 (GOWN DISPOSABLE) ×2
IRRIG SUCT STRYKERFLOW 2 WTIP (MISCELLANEOUS)
IRRIGATION SUCT STRKRFLW 2 WTP (MISCELLANEOUS) IMPLANT
KIT BASIN OR (CUSTOM PROCEDURE TRAY) ×2 IMPLANT
KIT CLEAN ENDO COMPLIANCE (KITS) ×2 IMPLANT
KIT PEG 24FR PULL ENFIT (KITS) ×1
KIT PG 24FR PUL EVV ENFIT (KITS) ×2 IMPLANT
KIT TURNOVER KIT B (KITS) ×2 IMPLANT
NS IRRIG 1000ML POUR BTL (IV SOLUTION) ×2 IMPLANT
PAD ARMBOARD 7.5X6 YLW CONV (MISCELLANEOUS) ×4 IMPLANT
SCISSORS LAP 5X35 DISP (ENDOMECHANICALS) IMPLANT
SET TUBE SMOKE EVAC HIGH FLOW (TUBING) ×2 IMPLANT
SLEEVE Z-THREAD 5X100MM (TROCAR) ×2 IMPLANT
SUT MNCRL AB 4-0 PS2 18 (SUTURE) ×2 IMPLANT
TOWEL GREEN STERILE (TOWEL DISPOSABLE) ×2 IMPLANT
TOWEL GREEN STERILE FF (TOWEL DISPOSABLE) ×2 IMPLANT
TRAY LAPAROSCOPIC MC (CUSTOM PROCEDURE TRAY) ×2 IMPLANT
TROCAR BALLN 12MMX100 BLUNT (TROCAR) IMPLANT
TROCAR Z-THREAD OPTICAL 5X100M (TROCAR) ×2 IMPLANT
TUBE CONNECTING 12X1/4 (SUCTIONS) ×2 IMPLANT
TUBING ENDO SMARTCAP (MISCELLANEOUS) ×2 IMPLANT
WARMER LAPAROSCOPE (MISCELLANEOUS) ×2 IMPLANT
WATER STERILE IRR 1000ML POUR (IV SOLUTION) ×2 IMPLANT

## 2022-10-24 NOTE — Assessment & Plan Note (Signed)
Treated and now appears euvolemic - Continue Entresto, BB, spiro, Lasix

## 2022-10-24 NOTE — Assessment & Plan Note (Signed)
-   Continue cefazolin - Plan for TEE on Monday

## 2022-10-24 NOTE — Progress Notes (Addendum)
Nutrition Follow-up  DOCUMENTATION CODES:  Not applicable  INTERVENTION:  Once cleared by surgery, restart tube feeding per PEG tube:  Osmolite 1.5 @ 65 ml/hr (1560 ml/day) Prosource TF20 daily  TF at goal rate provides 2340 kcal, 118 g protein, 1188 ml fluid  Transition to bolus feeds 7/11 in anticipation of discharge. Start with 1/2 can and increase by 1/2 can every 1-2 feeds to promote tolerance Goal bolus regimen: Jevity 1.5 6x/d (7 cartons total each day) Free water 30mL free water before and after each bolus This provides 2485kcal, 106g protein, and of free water ( total free water)  NUTRITION DIAGNOSIS:   Inadequate oral intake related to inability to eat as evidenced by NPO status. Ongoing.   GOAL:   Patient will meet greater than or equal to 90% of their needs Met with TF at goal  MONITOR:   Diet advancement, Labs, Weight trends, TF tolerance, I & O's  REASON FOR ASSESSMENT:   Consult Enteral/tube feeding initiation and management  ASSESSMENT:   62 y.o. male presented to the ED with L side weakness, slurred speech, slow speech, and feeling dizzy. PMH includes CAD, CHF, HLD, and HTN. Pt admitted for stroke.  6/18 - Admitted; s/p TNK 6/19 - s/p thrombectomy of L M3; failed bedside swallow; Cortrak placed; tip distal stomach 7/10 - PEG placed in OR via EGD by surgery team  Pt out of room for PEG placement this AM. Medically stable for discharge once PEG in place and tolerating TF. Pt has bed offers from SNF which family is reviewing. Will resume continuous feeds at 1600 per surgery and start to transition to bolus feeds tomorrow, 7/11. Orders placed. Discussed with RN, CM, and MD  Nutritionally Relevant Medications: Scheduled Meds:  atorvastatin  80 mg Per Tube Daily   famotidine  20 mg Per Tube BID   feeding supplement (PROSource TF20)  60 mL Per Tube Daily   furosemide  40 mg Per Tube BID   insulin aspart  0-20 Units Subcutaneous Q4H    multivitamin with minerals  1 tablet Per Tube Daily   polyethylene glycol  17 g Per Tube BID   spironolactone  12.5 mg Per Tube Daily   Continuous Infusions:   ceFAZolin (ANCEF) IV 2 g (10/24/22 1424)   feeding supplement (OSMOLITE 1.5 CAL)     PRN Meds: ondansetron, senna-docusate  Labs Reviewed: BUN 55 CBG ranges from 105-156 mg/dL over the last 24 hours  Admission weight: 108.6 kg Current weight: 96.6 kg  Diet Order:   Diet Order             Diet NPO time specified  Diet effective now                   EDUCATION NEEDS:   Not appropriate for education at this time  Skin:  Skin Assessment: Reviewed RN Assessment  Last BM:  7/9 - type 6  Height:  Ht Readings from Last 1 Encounters:  10/03/22 6\' 1"  (1.854 m)    Weight:  Wt Readings from Last 1 Encounters:  10/24/22 96.6 kg    Ideal Body Weight:  83.6 kg  BMI:  Body mass index is 28.1 kg/m.  Estimated Nutritional Needs:  Kcal:  2200-2400 kcal/d Protein:  110-130 g/d Fluid:  >/= 2 L    Greig Castilla, RD, LDN Clinical Dietitian RD pager # available in [facility]  After hours/weekend pager # available in [facility]

## 2022-10-24 NOTE — Hospital Course (Addendum)
Mr. Demuro is a 62 y.o. 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     Significant events: 6/18: Admitted for stroke, received TNK; developed right sided deficits overnight after TNK, underwent NIR thrombectomy 6/19: Post thrombectomy CT showed left frontal IPH and SAH 6/20: Heparin started cautiously 6/22: CT showed progression of hematomas, heparin stopped 6/23: Repeat echo shows dehiscence of prior pseudoaneurysm patch with adherent LV thrombus; Cardiology consulted 6/26: ID consulted for MSSE bacteremia 6/28: EP consulted, recommended not to take out ICD; CT stabilized, heparin restarted 7/2: Transferred OOU 7/10: Placed PEG tube 7/11: Transitioned to basal bolus feedings and switched to Eliquis 7/13: Increased HA, repeat CT shows progression of ICHs; Eliquis held 7/15: TEE negative, antibiotics stopped 7/17: Eliquis restarted    Significant studies: 6/18 CTA head and neck: no LVO 6/18 repeat CTA head and neck: new distal left MCA M2 occlusion 6/19 Echo: poor quality images, nondiagnostic 6/19 TCD: positive study, indicating R>L shunt 6/19 LE doppler: no DVT 6/19 CT head: left frontal parietal IPH and adjacent [facility] 6/22 CT head: size now 8x4x4cm, with new 2cm component 6/23 complete echo: now shows recurrence of pseudoaneurysm, dehiscence of patch with adherent thrombus 6/25 coronary CTA: confirms above 6/28 CT head: stable large left frontoparietal hematoma 7/13: CT head shows worsening ICH 7/15 TEE: shows pseudoaneurysm with adherent clot, no valvular vegetations, no signs of vegetation on ICD leads 7/16: CT head shows regression of ICH noted 7/13 7/19: CT head shows no change in ICH   Significant microbiology data: 6/18 MRSA nares: Negative 6/25 Blood cultures: MSSE in 2/2 6/27 blood cultures: NG in 2/2     Procedures: 6/18: TNK at 1246 6/19: Thrombectomy by Dr. Tommie Sams 6/19: Cortrak placed 7/10: PEG placed 7/15: TEE    Consults: Cardiology Infectious disease Electrophysiology Critical Care Palliative Care General Surgery

## 2022-10-24 NOTE — Anesthesia Postprocedure Evaluation (Signed)
Anesthesia Post Note  Patient: [name]  Procedure(s) Performed: ATTEMPTED PERCUTANEOUS ENDOSCOPIC GASTROSTOMY (PEG) PLACEMENT LAPAROSCOPY ASSISTED GASTROSTOMY (PEG) PLACEMENT     Patient location during evaluation: PACU Anesthesia Type: General Level of consciousness: awake and alert and patient cooperative Pain management: pain level controlled Vital Signs Assessment: post-procedure vital signs reviewed and stable Respiratory status: spontaneous breathing, nonlabored ventilation, respiratory function stable and patient connected to nasal cannula oxygen Cardiovascular status: blood pressure returned to baseline and stable Postop Assessment: no apparent nausea or vomiting Anesthetic complications: no   No notable events documented.  Last Vitals:  Vitals:   10/24/22 1034 10/24/22 1059  BP:  117/89  Pulse: 86 88  Resp: 18 18  Temp: 36.9 C 36.9 C  SpO2: 96% 97%    Last Pain:  Vitals:   10/24/22 1059  TempSrc: Axillary  PainSc:                  Edwyna Dangerfield,E. Ayaan Shutes

## 2022-10-24 NOTE — Assessment & Plan Note (Signed)
BMI 32.2.

## 2022-10-24 NOTE — Assessment & Plan Note (Signed)
Treated with hypertonic saline.  Stable and no further 3% saline required.  Prognosis poor.

## 2022-10-24 NOTE — Op Note (Signed)
   Procedure Note  Date: 10/24/2022  Procedure: esophagogastroduodenoscopy (EGD), diagnostic laparoscopy, laparoscopic assisted percutaneous endoscopic gastrostomy (PEG) tube placement  Pre-op diagnosis: dysphagia Post-op diagnosis: same  Indication and clinical history: 30M with dysphagia  Surgeon: Diamantina Monks, MD Assistant: Earl Gala, [place]  Anesthesia: general  Findings:  Specimen: none EBL: <5cc Drains/Implants: PEG tube, 4cm at the skin   Disposition: PACU  Description of Procedure: The patient was positioned supine. Time-out was performed verifying correct patient, procedure, signature of informed consent, and pre-operative antibiotics as indicated. MAC induction was uneventful and a bite block was placed into the oropharynx. The endoscope was inserted into the oropharynx and advanced down the esophagus into the stomach and into the duodenum. The visualized esophagus and duodenum were unremarkable. The endoscope was retracted back into the stomach and the stomach was insufflated. The stomach was inspected and was also normal. Transillumination was performed. The light was only visible on the external skin beneath the ribcage, even with reverse Trendelenburg positioning. The decision was made to convert to a laparoscopic assisted procedure. The abdomen was prepped and draped in the usual sterile fashion. An infra-umbilical Roseanne Reno techniques was used to enter the peritoneal cavity and an additional 5mm port placed in the left abdomen. The stomach was retracted caudad to be able to reach the abdominal wall. An introducer needle and sheath were inserted and visualized endoscopically. The needle was removed and guidewire inserted. The guidewire was grasped by an endoscopic snare and the snare, guidewire, and endoscope retracted out of the oropharynx. The PEG tube was secured to the guidewire and retracted through the mouth and esophagus into the stomach. The PEG tube was secured with a  bolster and was visualized endoscopically to spin freely circumferentially and also be without gaps between the internal bumper and the stomach wall. There was no evidence of bleeding. The PEG bolster was secured at 4cm at the skin and there were no gaps between the bolster and the abdominal wall. The stomach was desufflated laparoscopically and endoscopically and the endoscope removed. The bite block was also removed. The patient tolerated the procedure well and there were no complications.   The patient may have water and medications administered via the PEG tube beginning immediately and tube feeds may be initiated four hours post-procedure. Therapeutic anti-coagulation may be (re)started at 1600 on 10/24/22.   Diamantina Monks, MD General and Trauma Surgery [facility] Surgery

## 2022-10-24 NOTE — Assessment & Plan Note (Signed)
PEG placed 7/10.   - Transition to bolus feeding schedule today

## 2022-10-24 NOTE — Assessment & Plan Note (Signed)
On 6/19 and 6/20 was tachypneic to the high 20s and hypoxic, requiring 4L nasal cannula.  Due to aspiration pneumonia and CHF.  Treated and resolved.

## 2022-10-24 NOTE — Progress Notes (Signed)
Tube feedings cut off at midnight.  CHG bath completed

## 2022-10-24 NOTE — Assessment & Plan Note (Signed)
Resolved

## 2022-10-24 NOTE — Assessment & Plan Note (Signed)
-   Continue Lipitor

## 2022-10-24 NOTE — Anesthesia Procedure Notes (Signed)
Procedure Name: Intubation Date/Time: 10/24/2022 8:38 AM  Performed by: Dairl Ponder, CRNAPre-anesthesia Checklist: Patient identified, Emergency Drugs available, Suction available and Patient being monitored Patient Re-evaluated:Patient Re-evaluated prior to induction Oxygen Delivery Method: Circle System Utilized Preoxygenation: Pre-oxygenation with 100% oxygen Induction Type: IV induction Ventilation: Mask ventilation without difficulty Laryngoscope Size: Mac and 4 Grade View: Grade I Tube type: Oral Tube size: 7.5 mm Number of attempts: 1 Airway Equipment and Method: Stylet and Oral airway Placement Confirmation: ETT inserted through vocal cords under direct vision, positive ETCO2 and breath sounds checked- equal and bilateral Secured at: 24 cm Tube secured with: Tape Dental Injury: Teeth and Oropharynx as per pre-operative assessment

## 2022-10-24 NOTE — Progress Notes (Signed)
Progress Note   Patient: [name] ZOX:[id number] DOB: 01-28-1961 DOA: 10/02/2022     22 DOS: the patient was seen and examined on 10/24/2022 at 10:56AM      Brief hospital course: [name] is a 62 y.o. 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     Significant events: 6/18: Admitted for stroke, received TNK; developed right sided deficits overnight after TNK, underwent NIR thrombectomy 6/19: Post thrombectomy CT showed progression of left frontal IPH and SAH 6/23: Repeat echo shows dehiscence of prior pseudoaneurysm patch with adherent LV thrombus; Cardiology consulted 6/26: ID consulted for MSSE bacteremia 6/28: EP consulted, recommended not to take out ICD 7/2: Transferred OOU 7/10: Placed PEG tube    Significant studies: 6/18 CTA head and neck: no LVO 6/18 repeat CTA head and neck: new distal left MCA M2 occlusion 6/19 Echo: poor quality images, nondiagnostic 6/19 TCD: positive study, indicating R>L shunt 6/19 LE doppler: no DVT 6/19 CT head: left frontal parietal IPH and adjacent [facility] 6/22 CT head: size now 8x4x4cm, with new 2cm component 6/23 complete echo: now shows recurrence of pseudoaneurysm, dehiscence of patch with adherent thrombus 6/25 coronary CTA: confirms above 6/28 CT head: stable large left frontoparietal hematoma   Significant microbiology data: 6/18 MRSA nares: Negative 6/25 Blood cultures: MSSE in 2/2 6/27 blood cultures: NG in 2/2    Procedures: 6/18: TNK at 1246 6/19: Thrombectomy by Dr. Tommie Sams 6/19: Cortrak placed 7/10: PEG placed    Consults: Cardiology Infectious disease Electrophysiology Critical Care Palliative Care General Surgery      Assessment and Plan: * Stroke (cerebrum) (HCC) - Continue Lipitor - Continue heparin gtt    Bacteremia due to  Gram-positive bacteria - Continue cefazolin    Pseudoaneurysm of left ventricle of heart Evaluated by Cardiology.   - Continue heparin - Ultimately, if his neuro recovery allows, he would need long term anticoagulation until he is felt to be healthy enough to undergo repeat pseudoaneurysm repair (a very high risk operation).   Cerebral edema (HCC) Treated with hypertonic saline.  Stable and no further 3% saline required.  Prognosis poor.  Thrombocytopenia (HCC) Resolved.  Aspiration pneumonia (HCC) Treated with Rocephin to cefazolin, now resolevd.  Acute on chronic systolic CHF (congestive heart failure) (HCC) Treated and now appears euvolemic - Continue Entresto, BB, spiro, Lasix  ICD (implantable cardioverter-defibrillator) in place    Obesity (BMI 30.0-34.9) BMI 32.2  Essential hypertension - Continue Entresto, Coreg, Lasix, metoprolol, spironolactone  Dyslipidemia - Continue LIpitor  Dysphagia PEG placed 7/10.   - Transition to Eliquis tomorrow when able to use PEG  Acute respiratory failure with hypoxia (HCC) On 6/19 and 6/20 was tachypneic to the high 20s and hypoxic, requiring 4L nasal cannula.  Due to aspiration pneumonia and CHF.  Treated and resolved.            Subjective: No fever, no nursing concerns.  Went for PEG today.       Physical Exam: BP 117/89 (BP Location: Left Arm)   Pulse 88   Temp 98.5 F (36.9 C) (Axillary)   Resp 18   Ht 6\' 1"  (1.854 m)   Wt 96.6 kg   SpO2 97%   BMI 28.10 kg/m   Adult male, lying in bed, unresponsive to me, still sedated from surgery RRR, no murmurs,  no peripheral edema Respiratory rate normal, lungs clear without rales or wheezes Abdomen soft without tenderness palpation or guarding, no ascites or distention, binder in place, no grimace to palpation Does not arouse to touch, no makes no spontaneous verbalizations or movements    Data Reviewed: Comprehensive metabolic panel unremarkable CBC  unremarkable  Family Communication: None present    Disposition: Status is: Inpatient         Author: Alberteen Sam, MD 10/24/2022 2:14 PM  For on call review [email or web address].

## 2022-10-24 NOTE — Progress Notes (Signed)
Trauma/Critical Care Follow Up Note  Subjective:    Overnight Issues:   Objective:  Vital signs for last 24 hours: Temp:  [97.5 F (36.4 C)-98.6 F (37 C)] 98.3 F (36.8 C) (07/10 0719) Pulse Rate:  [64-85] 82 (07/10 0719) Resp:  [18-20] 18 (07/10 0719) BP: (91-114)/(66-80) 111/79 (07/10 0719) SpO2:  [92 %-100 %] 92 % (07/10 0719) Weight:  [96.6 kg] 96.6 kg (07/10 0643)  Hemodynamic parameters for last 24 hours:    Intake/Output from previous day: 07/09 0701 - 07/10 0700 In: 1191.3 [I.V.:651.3; NG/GT:440; IV Piggyback:100] Out: 1400 [Urine:1400]  Intake/Output this shift: No intake/output data recorded.  Vent settings for last 24 hours:    Physical Exam:  Gen: comfortable, no distress Neuro: follows commands HEENT: PERRL Neck: supple CV: RRR Pulm: unlabored breathing on RA Abd: soft, NT    GU: urine clear and yellow  Extr: wwp, no edema  Results for orders placed or performed during the hospital encounter of 10/02/22 (from the past 24 hour(s))  Glucose, capillary     Status: Abnormal   Collection Time: 10/23/22 11:26 AM  Result Value Ref Range   Glucose-Capillary 234 (H) 70 - 99 mg/dL   Comment 1 Notify RN    Comment 2 Document in Chart   Glucose, capillary     Status: Abnormal   Collection Time: 10/23/22  3:15 PM  Result Value Ref Range   Glucose-Capillary 181 (H) 70 - 99 mg/dL   Comment 1 Notify RN    Comment 2 Document in Chart   Glucose, capillary     Status: Abnormal   Collection Time: 10/23/22  8:06 PM  Result Value Ref Range   Glucose-Capillary 151 (H) 70 - 99 mg/dL   Comment 1 Notify RN    Comment 2 Document in Chart   Glucose, capillary     Status: Abnormal   Collection Time: 10/24/22 12:13 AM  Result Value Ref Range   Glucose-Capillary 156 (H) 70 - 99 mg/dL   Comment 1 Notify RN    Comment 2 Document in Chart   CBC     Status: None   Collection Time: 10/24/22  2:29 AM  Result Value Ref Range   WBC 8.6 4.0 - 10.5 K/uL   RBC 4.78 4.22  - 5.81 MIL/uL   Hemoglobin 14.0 13.0 - 17.0 g/dL   HCT 82.9 56.2 - 13.0 %   MCV 89.3 80.0 - 100.0 fL   MCH 29.3 26.0 - 34.0 pg   MCHC 32.8 30.0 - 36.0 g/dL   RDW 86.5 78.4 - 69.6 %   Platelets 220 150 - 400 K/uL   nRBC 0.0 0.0 - 0.2 %  Comprehensive metabolic panel     Status: Abnormal   Collection Time: 10/24/22  2:29 AM  Result Value Ref Range   Sodium 136 135 - 145 mmol/L   Potassium 5.0 3.5 - 5.1 mmol/L   Chloride 100 98 - 111 mmol/L   CO2 25 22 - 32 mmol/L   Glucose, Bld 107 (H) 70 - 99 mg/dL   BUN 55 (H) 8 - 23 mg/dL   Creatinine, Ser 2.95 0.61 - 1.24 mg/dL   Calcium 9.6 8.9 - 28.4 mg/dL   Total Protein 7.4 6.5 - 8.1 g/dL   Albumin 3.6 3.5 - 5.0 g/dL   AST 27 15 - 41 U/L   ALT 19 0 - 44 U/L   Alkaline Phosphatase 104 38 - 126 U/L   Total Bilirubin 0.7 0.3 - 1.2 mg/dL  GFR, Estimated >60 >60 mL/min   Anion gap 11 5 - 15  Glucose, capillary     Status: Abnormal   Collection Time: 10/24/22  3:42 AM  Result Value Ref Range   Glucose-Capillary 105 (H) 70 - 99 mg/dL   Comment 1 Notify RN    Comment 2 Document in Chart   Glucose, capillary     Status: Abnormal   Collection Time: 10/24/22  7:16 AM  Result Value Ref Range   Glucose-Capillary 131 (H) 70 - 99 mg/dL    Assessment & Plan:  Present on Admission:  Stroke (cerebrum) (HCC)  Bacteremia due to Gram-positive bacteria  Dyslipidemia  Essential hypertension  ICD (implantable cardioverter-defibrillator) in place  Hyperlipidemia  Obesity (BMI 30.0-34.9)    LOS: 22 days   Additional comments:I reviewed the patient's new clinical lab test results.   and I reviewed the patients new imaging test results.    CVA with dysphagia  Consult for PEG placement  - IR saw and did not feel there was a window for percutaneous gastrostomy placement - palliative care following and family desires PEG placement - tolerating TF and having bowel function  - plan for PEG placement in the OR today   FEN: held TF after MN  VTE:  hep gtt - held at 0400 ID: on ancef for bacteremia  Diamantina Monks, MD Trauma & General Surgery Please use [email or web address] to contact on call provider  10/24/2022  *Care during the described time interval was provided by me. I have reviewed this patient's available data, including medical history, events of note, physical examination and test results as part of my evaluation.

## 2022-10-24 NOTE — Assessment & Plan Note (Signed)
Intraparenchymal hematoma Given his hematoma, and that he has now completed 10 days IV therapeutic dose heparin, we will start with apixaban 5 mg BID - Continue Lipitor - Stop heparin drip - Transition to Eliquis

## 2022-10-24 NOTE — Assessment & Plan Note (Signed)
-   Continue Entresto, Coreg, Lasix, metoprolol, spironolactone

## 2022-10-24 NOTE — Transfer of Care (Signed)
Immediate Anesthesia Transfer of Care Note  Patient: [name]  Procedure(s) Performed: ATTEMPTED PERCUTANEOUS ENDOSCOPIC GASTROSTOMY (PEG) PLACEMENT LAPAROSCOPY ASSISTED GASTROSTOMY (PEG) PLACEMENT  Patient Location: PACU  Anesthesia Type:General  Level of Consciousness: drowsy  Airway & Oxygen Therapy: Patient Spontanous Breathing  Post-op Assessment: Report given to RN and Post -op Vital signs reviewed and stable  Post vital signs: Reviewed and stable  Last Vitals:  Vitals Value Taken Time  BP 134/84 10/24/22 1006  Temp    Pulse 86 10/24/22 1009  Resp 18 10/24/22 1009  SpO2 97 % 10/24/22 1009  Vitals shown include unvalidated device data.  Last Pain:  Vitals:   10/24/22 0719  TempSrc: Oral  PainSc: 0-No pain      Patients Stated Pain Goal: 0 (10/07/22 2000)  Complications: No notable events documented.

## 2022-10-24 NOTE — Progress Notes (Signed)
ANTICOAGULATION CONSULT NOTE - Follow Up Consult  Pharmacy Consult for heparin  Indication: LV pseudoaneurysm / thrombus  No Known Allergies  Patient Measurements: Height: 6\' 1"  (185.4 cm) Weight: 96.6 kg (212 lb 15.4 oz) IBW/kg (Calculated) : 79.9 Heparin Dosing Weight: 101 kg   Vital Signs: Temp: 98.5 F (36.9 C) (07/10 1059) Temp Source: Axillary (07/10 1059) BP: 117/89 (07/10 1059) Pulse Rate: 88 (07/10 1059)  Labs: Recent Labs    10/22/22 0621 10/22/22 1107 10/22/22 1107 10/23/22 0236 10/23/22 0622 10/24/22 0229  HGB  --  15.1   < > 14.2  --  14.0  HCT  --  45.7  --  43.3  --  42.7  PLT  --  229  --  215  --  220  HEPARINUNFRC 0.28*  --   --  0.46  --   --   CREATININE  --   --   --   --  1.06 1.15   < > = values in this interval not displayed.     Estimated Creatinine Clearance: 82.6 mL/min (by C-G formula based on SCr of 1.15 mg/dL).   Assessment: 11 YOM with hx LV pseudoaneurysm s/p patch now with large thrombus burden, admitted with ischemic stroke with hemorrhagic conversion, CT stable and pharmacy consulted on 6/28 to cautiously start heparin gtt for LV pseudoaneurysm, no bolus with low end goals.     Heparin held 0400 7/10 for PEG placement.  OK to resetart heparin at 1600 this afternoon.    Heparin level previously therapeutic on 900 units/hr.   Goal of Therapy:  Heparin level 0.3-0.5 units/ml Monitor platelets by anticoagulation protocol: Yes  Plan:  At 1600, restart heparin at 900 units/hr  Daily heparin level and CBC Monitor signs and symptoms of bleeding  Follow-up need for additional procedures vs. transition to oral anticoagulation.    Thank you for involving pharmacy in the patient's care.   [employer], Pharm.D., BCPS Clinical Pharmacist Clinical phone for 10/24/2022 from 7:30-3:00 is [phone].  **Pharmacist phone directory can be found on [email or web address] listed under [facility].  10/24/2022 12:54 PM

## 2022-10-24 NOTE — Assessment & Plan Note (Signed)
Treated with Rocephin to cefazolin, now resolevd.

## 2022-10-24 NOTE — Assessment & Plan Note (Signed)
Evaluated by Cardiology.   - Continue heparin - Ultimately, if his neuro recovery allows, he would need long term anticoagulation until he is felt to be healthy enough to undergo repeat pseudoaneurysm repair (a very high risk operation).

## 2022-10-25 ENCOUNTER — Encounter (HOSPITAL_COMMUNITY): Payer: Self-pay | Admitting: Surgery

## 2022-10-25 DIAGNOSIS — T827XXA Infection and inflammatory reaction due to other cardiac and vascular devices, implants and grafts, initial encounter: Secondary | ICD-10-CM | POA: Diagnosis not present

## 2022-10-25 DIAGNOSIS — B957 Other staphylococcus as the cause of diseases classified elsewhere: Secondary | ICD-10-CM | POA: Diagnosis not present

## 2022-10-25 DIAGNOSIS — R7881 Bacteremia: Secondary | ICD-10-CM | POA: Diagnosis not present

## 2022-10-25 DIAGNOSIS — I253 Aneurysm of heart: Secondary | ICD-10-CM | POA: Diagnosis not present

## 2022-10-25 DIAGNOSIS — I5023 Acute on chronic systolic (congestive) heart failure: Secondary | ICD-10-CM | POA: Diagnosis not present

## 2022-10-25 DIAGNOSIS — I63413 Cerebral infarction due to embolism of bilateral middle cerebral arteries: Secondary | ICD-10-CM | POA: Diagnosis not present

## 2022-10-25 LAB — CBC
HCT: 42.2 % (ref 39.0–52.0)
Hemoglobin: 13.9 g/dL (ref 13.0–17.0)
MCH: 29.6 pg (ref 26.0–34.0)
MCHC: 32.9 g/dL (ref 30.0–36.0)
MCV: 90 fL (ref 80.0–100.0)
Platelets: 158 10*3/uL (ref 150–400)
RBC: 4.69 MIL/uL (ref 4.22–5.81)
RDW: 12.9 % (ref 11.5–15.5)
WBC: 10.3 10*3/uL (ref 4.0–10.5)
nRBC: 0 % (ref 0.0–0.2)

## 2022-10-25 LAB — GLUCOSE, CAPILLARY
Glucose-Capillary: 128 mg/dL — ABNORMAL HIGH (ref 70–99)
Glucose-Capillary: 141 mg/dL — ABNORMAL HIGH (ref 70–99)
Glucose-Capillary: 151 mg/dL — ABNORMAL HIGH (ref 70–99)
Glucose-Capillary: 168 mg/dL — ABNORMAL HIGH (ref 70–99)
Glucose-Capillary: 207 mg/dL — ABNORMAL HIGH (ref 70–99)
Glucose-Capillary: 87 mg/dL (ref 70–99)
Glucose-Capillary: 93 mg/dL (ref 70–99)

## 2022-10-25 LAB — HEPARIN LEVEL (UNFRACTIONATED): Heparin Unfractionated: 0.46 IU/mL (ref 0.30–0.70)

## 2022-10-25 MED ORDER — APIXABAN 5 MG PO TABS
10.0000 mg | ORAL_TABLET | Freq: Two times a day (BID) | ORAL | Status: DC
Start: 2022-10-25 — End: 2022-10-25

## 2022-10-25 MED ORDER — APIXABAN 5 MG PO TABS
5.0000 mg | ORAL_TABLET | Freq: Two times a day (BID) | ORAL | Status: DC
Start: 2022-11-01 — End: 2022-10-25

## 2022-10-25 MED ORDER — APIXABAN 5 MG PO TABS
5.0000 mg | ORAL_TABLET | Freq: Two times a day (BID) | ORAL | Status: DC
  Administered 2022-10-25 – 2022-10-27 (×5): 5 mg
  Filled 2022-10-25 (×6): qty 1

## 2022-10-25 NOTE — Progress Notes (Addendum)
ANTICOAGULATION CONSULT NOTE - Follow Up Consult  Pharmacy Consult for heparin  Indication: LV pseudoaneurysm / thrombus  No Known Allergies  Patient Measurements: Height: 6\' 1"  (185.4 cm) Weight: 96.6 kg (212 lb 15.4 oz) IBW/kg (Calculated) : 79.9 Heparin Dosing Weight: 101 kg   Vital Signs: Temp: 98.5 F (36.9 C) (07/11 0736) Temp Source: Oral (07/11 0736) BP: 127/93 (07/11 0736) Pulse Rate: 81 (07/11 0736)  Labs: Recent Labs    10/23/22 0236 10/23/22 0622 10/24/22 0229 10/25/22 0819  HGB 14.2  --  14.0 13.9  HCT 43.3  --  42.7 42.2  PLT 215  --  220 158  HEPARINUNFRC 0.46  --   --  0.46  CREATININE  --  1.06 1.15  --     Estimated Creatinine Clearance: 82.6 mL/min (by C-G formula based on SCr of 1.15 mg/dL).   Assessment: [name] with hx LV pseudoaneurysm s/p patch now with large thrombus burden, admitted with ischemic stroke with hemorrhagic conversion, CT stable and pharmacy consulted on 6/28 to cautiously start heparin gtt for LV pseudoaneurysm, no bolus with low end goals.     Heparin held 0400 7/10 for PEG placement. Restarted 7/10 at 1600.   Heparin level therapeutic at 0.46 at 900 units/hr   Goal of Therapy:  Heparin level 0.3-0.5 units/ml Monitor platelets by anticoagulation protocol: Yes  Plan:  Continue heparin 900 units/hr Daily heparin level and CBC Monitor signs and symptoms of bleeding  Follow-up need for additional procedures vs. transition to oral anticoagulation.    Thank you for involving pharmacy in the patient's care.   Theotis Burrow, PharmD PGY1 Acute Care Pharmacy Resident  10/25/2022 9:39 AM

## 2022-10-25 NOTE — Progress Notes (Signed)
    [facility]   Reason for visit: Follow up on bacteremia  Interval History: now with PEG tube; WBC 10.3; remains afebrile    Physical Exam: Constitutional:  Vitals:   10/25/22 0335 10/25/22 0736  BP: 116/82 (!) 127/93  Pulse: 93 81  Resp: 18   Temp: 98.7 F (37.1 C) 98.5 F (36.9 C)  SpO2: 100% 98%   patient appears in NAD Respiratory: Normal respiratory effort  Review of Systems: Constitutional: negative for fevers and chills  Lab Results  Component Value Date   WBC 10.3 10/25/2022   HGB 13.9 10/25/2022   HCT 42.2 10/25/2022   MCV 90.0 10/25/2022   PLT 158 10/25/2022    Lab Results  Component Value Date   CREATININE 1.15 10/24/2022   BUN 55 (H) 10/24/2022   NA 136 10/24/2022   K 5.0 10/24/2022   CL 100 10/24/2022   CO2 25 10/24/2022    Lab Results  Component Value Date   ALT 19 10/24/2022   AST 27 10/24/2022   ALKPHOS 104 10/24/2022     Microbiology: No results found for this or any previous visit (from the past 240 hour(s)).  Impression/Plan:  1. Bacteremia - he remains on cefazolin for Staph epi bacteremia in 2 sets of blood cultures in the setting of an ICD.   TEE has been postponed waiting for patient to improve.   Consider TEE now to determine if ICD affected or not and can determine antibiotic plan.  If there is concern for lead vegetation, he would need lifelong suppression, if not removed.   Will continue with cefazolin  I have personally spent 42 minutes involved in face-to-face and non-face-to-face activities for this patient on the day of the visit. Professional time spent includes the following activities: Preparing to see the patient (review of tests), Obtaining and/or reviewing separately obtained history (admission/discharge record), Performing a medically appropriate examination and/or evaluation , Ordering medications/tests/procedures, referring and communicating with other health care professionals, Documenting  clinical information in the EMR, Independently interpreting results (not separately reported), Communicating results to the patient/family/caregiver, Counseling and educating the patient/family/caregiver and Care coordination (not separately reported).

## 2022-10-25 NOTE — Progress Notes (Signed)
Trauma/Critical Care Follow Up Note  Subjective:    Overnight Issues:  No complaints.  No abdominal pain.  Tolerating bolus TFs.  Trying to tell me something, but speech garbled and unable to understand. Objective:  Vital signs for last 24 hours: Temp:  [98.5 F (36.9 C)-98.8 F (37.1 C)] 98.5 F (36.9 C) (07/11 0736) Pulse Rate:  [81-99] 81 (07/11 0736) Resp:  [14-19] 18 (07/11 0335) BP: (111-127)/(64-93) 127/93 (07/11 0736) SpO2:  [97 %-100 %] 98 % (07/11 0736)   Intake/Output from previous day: 07/10 0701 - 07/11 0700 In: 250 [IV Piggyback:250] Out: 780 [Urine:775; Blood:5]  Intake/Output this shift: Total I/O In: -  Out: 800 [Urine:800]   Physical Exam:  Abd: soft, NT, ND, incisions c/d/I, g-tube in place and currently clamped off.  Results for orders placed or performed during the hospital encounter of 10/02/22 (from the past 24 hour(s))  Glucose, capillary     Status: Abnormal   Collection Time: 10/24/22 11:39 AM  Result Value Ref Range   Glucose-Capillary 152 (H) 70 - 99 mg/dL  Glucose, capillary     Status: Abnormal   Collection Time: 10/24/22  4:10 PM  Result Value Ref Range   Glucose-Capillary 150 (H) 70 - 99 mg/dL  Glucose, capillary     Status: Abnormal   Collection Time: 10/24/22  7:53 PM  Result Value Ref Range   Glucose-Capillary 233 (H) 70 - 99 mg/dL   Comment 1 Notify RN   Glucose, capillary     Status: Abnormal   Collection Time: 10/25/22 12:13 AM  Result Value Ref Range   Glucose-Capillary 151 (H) 70 - 99 mg/dL   Comment 1 Notify RN    Comment 2 Document in Chart   Glucose, capillary     Status: Abnormal   Collection Time: 10/25/22  3:38 AM  Result Value Ref Range   Glucose-Capillary 168 (H) 70 - 99 mg/dL   Comment 1 Notify RN    Comment 2 Document in Chart   Glucose, capillary     Status: None   Collection Time: 10/25/22  7:39 AM  Result Value Ref Range   Glucose-Capillary 93 70 - 99 mg/dL   Comment 1 Notify RN   Heparin level  (unfractionated)     Status: None   Collection Time: 10/25/22  8:19 AM  Result Value Ref Range   Heparin Unfractionated 0.46 0.30 - 0.70 IU/mL  CBC     Status: None   Collection Time: 10/25/22  8:19 AM  Result Value Ref Range   WBC 10.3 4.0 - 10.5 K/uL   RBC 4.69 4.22 - 5.81 MIL/uL   Hemoglobin 13.9 13.0 - 17.0 g/dL   HCT 16.1 09.6 - 04.5 %   MCV 90.0 80.0 - 100.0 fL   MCH 29.6 26.0 - 34.0 pg   MCHC 32.9 30.0 - 36.0 g/dL   RDW 40.9 81.1 - 91.4 %   Platelets 158 150 - 400 K/uL   nRBC 0.0 0.0 - 0.2 %    Assessment & Plan:  Present on Admission:  Stroke (cerebrum) (HCC)  Bacteremia due to Gram-positive bacteria  Dyslipidemia  Essential hypertension  ICD (implantable cardioverter-defibrillator) in place  Obesity (BMI 30.0-34.9)  Coronary artery disease  Acute on chronic systolic CHF (congestive heart failure) (HCC)  Pseudoaneurysm of left ventricle of heart  Acute respiratory failure with hypoxia (HCC)    LOS: 23 days   Additional comments:I reviewed the patient's new clinical lab test results.   and I  reviewed the patients new imaging test results.    POD 1, s/p lap assisted g-tube placement, Dr. Bedelia Person 7/10 for CVA with dysphagia  -doing well from g-tube standpoint -tolerating bolus feeds -all incisions are sutured subcutaneously with dermabond present -he does not need surgical follow up at this, except on a prn basis. -we will sign off at this time.  Please call if needed   FEN: TFs VTE: hep gtt  ID: on ancef for bacteremia  Letha Cape, PA-C Trauma & General Surgery Please use [email or web address] to contact on call provider  10/25/2022  *Care during the described time interval was provided by me. I have reviewed this patient's available data, including medical history, events of note, physical examination and test results as part of my evaluation.

## 2022-10-25 NOTE — Progress Notes (Signed)
Progress Note   Patient: [name] WUJ:[id number] DOB: 16-Jun-1960 DOA: 10/02/2022     23 DOS: the patient was seen and examined on 10/25/2022 at 10:25 AM      Brief hospital course: Mr. Mobley is a 62 y.o. 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     Significant events: 6/18: Admitted for stroke, received TNK; developed right sided deficits overnight after TNK, underwent NIR thrombectomy 6/19: Post thrombectomy CT showed progression of left frontal IPH and SAH 6/23: Repeat echo shows dehiscence of prior pseudoaneurysm patch with adherent LV thrombus; Cardiology consulted 6/26: ID consulted for MSSE bacteremia 6/28: EP consulted, recommended not to take out ICD 7/2: Transferred [facility] 7/10: Placed PEG tube 7/11: Transitioned to basal bolus feedings and switched to Eliquis    Significant studies: 6/18 CTA head and neck: no LVO 6/18 repeat CTA head and neck: new distal left MCA M2 occlusion 6/19 Echo: poor quality images, nondiagnostic 6/19 TCD: positive study, indicating R>L shunt 6/19 LE doppler: no DVT 6/19 CT head: left frontal parietal IPH and adjacent [facility] 6/22 CT head: size now 8x4x4cm, with new 2cm component 6/23 complete echo: now shows recurrence of pseudoaneurysm, dehiscence of patch with adherent thrombus 6/25 coronary CTA: confirms above 6/28 CT head: stable large left frontoparietal hematoma 7/15 TEE: pending   Significant microbiology data: 6/18 MRSA nares: Negative 6/25 Blood cultures: MSSE in 2/2 6/27 blood cultures: NG in 2/2    Procedures: 6/18: TNK at 1246 6/19: Thrombectomy by Dr. Tommie Sams 6/19: Cortrak placed 7/10: PEG placed    Consults: Cardiology Infectious disease Electrophysiology Critical Care Palliative Care General Surgery      Assessment and Plan: * Stroke  (cerebrum) (HCC) Intraparenchymal hematoma Given his hematoma, and that he has now completed 10 days IV therapeutic dose heparin, we will start with apixaban 5 mg BID - Continue Lipitor - Stop heparin drip - Transition to Eliquis    Bacteremia due to Gram-positive bacteria - Continue cefazolin - Plan for TEE on Monday   Pseudoaneurysm of left ventricle of heart Evaluated by Cardiology.   - Continue anticoagulation (transition to Eliquis now that PEG operational) - Ultimately, if his neuro recovery allows, he would need long term anticoagulation until he is felt to be healthy enough to undergo repeat pseudoaneurysm repair (a very high risk operation).   Cerebral edema (HCC) Treated with hypertonic saline.  Stable and no further 3% saline required.  Prognosis poor.  Thrombocytopenia (HCC) Resolved.  Aspiration pneumonia (HCC) Treated with Rocephin to cefazolin, now resolevd.  Acute on chronic systolic CHF (congestive heart failure) (HCC) Treated and now appears euvolemic - Continue Entresto, BB, spiro, Lasix  ICD (implantable cardioverter-defibrillator) in place    Obesity (BMI 30.0-34.9) BMI 32.2  Essential hypertension - Continue Entresto, Coreg, Lasix, metoprolol, spironolactone  Dyslipidemia - Continue Lipitor  Dysphagia PEG placed 7/10.   - Transition to bolus feeding schedule today  Coronary artery disease    Acute respiratory failure with hypoxia (HCC) On 6/19 and 6/20 was tachypneic to the high 20s and hypoxic, requiring 4L nasal cannula.  Due to aspiration pneumonia and CHF.  Treated and resolved.            Subjective: Patient denies headache, chest discomfort, abdominal pain, shortness of breath.  He has no complaints.  He seems to be  asking for me to call his girlfriend/wife [name].  Heavily dysarthric, but appropriate in answers (nodding or shaking head yes and no)     Physical Exam: BP 104/63   Pulse 87   Temp 97.9 F (36.6 C) (Oral)    Resp 18   Ht 6\' 1"  (1.854 m)   Wt 96.6 kg   SpO2 95%   BMI 28.10 kg/m   Adult male, sitting up in bed, interactive and appropriate RRR, no murmurs, no peripheral edema Respiratory rate normal, lungs clear without rales or wheezes Abdomen soft without tenderness palpation Right side weak, left side 4/5, lower extremities bilaterally quite weak, seems worse on the right, heavily dysarthric, face symmetric, nods appropriately yes and no    Data Reviewed: Discussed with infectious disease  CBC unremarkable Discussed with pharmacy  Family Communication: Called to wife, Gertha Calkin, no answer    Disposition: Status is: Inpatient         Author: Alberteen Sam, MD 10/25/2022 2:25 PM  For on call review [email or web address].

## 2022-10-25 NOTE — Progress Notes (Signed)
Speech Language Pathology Treatment: Dysphagia;Cognitive-Linquistic  Patient Details Name: [name] MRN: [id number] DOB: 07/13/1960 Today's Date: 10/25/2022 Time: 7829-5621 SLP Time Calculation (min) (ACUTE ONLY): 30 min  Assessment / Plan / Recommendation Clinical Impression  Pt seen today for treatment involving swallowing, cognition, and language. SLP provided pt with a toothbrush to complete oral care, which he did with Min verbal cues. When asked to spit, pt unable to follow command given context and verbal cueing. During trials of nectar thick liquids and purees via teaspoon, pt had significantly reduced oral residue and anterior spillage compared to last session. No coughing/throat clearing was noted immediately after PO trials, although note pt did have a delayed cough. Given his improvement with awareness of boluses, he may benefit from an instrumental swallow evaluation to make ongoing recommendations for a PO diet.   Pt is showing improvement in use of compensatory strategies for communication. He successfully used the "more" when given Min phonemic cues and a model to request more POs throughout the session. Given a model and Max tactile cues, pt attempted counting to ten. While his verbalizations appeared to follow the intonation of the SLP's model, they were mostly unintelligible. When asked if he was in pain, pt unable to locate source of pain given multiple choices with Max cueing. It appeared that pt attempted to point to identify the source of his pain, he exhibited a groping-like motion. Throughout the session, he consistently attempted verbalizations, although note they were minimally intelligible [name]", "I know"). SLP provided a written list of his family member's names and when asked to identify using yes/no questions, pt did so independently. Plan to target use of picture boards in later sessions. SLP will continue to f/u.    HPI HPI: Pt is a 62 y/o male who presented 6/19  with L weakness and numbness. Pt received TNK then developed new symptoms of aphasia and R weakness. He underwent mechanical thrombectomy L M3 segment MCA 6/19. Pt noted to have small hemorrhage and follow up CT showed progression of hemorrhage 3cm then 7cm and small acute R cerebellar infarct. PMH: CAD s/p PCI w/stent and 1v CABG, multiple CGAs, systolic HF s/p ICD, HLD, HTN.      SLP Plan  Continue with current plan of care      Recommendations for follow up therapy are one component of a multi-disciplinary discharge planning process, led by the attending physician.  Recommendations may be updated based on patient status, additional functional criteria and insurance authorization.    Recommendations  Diet recommendations: NPO Medication Administration: Via alternative means                  Oral care BID   Frequent or constant Supervision/Assistance Dysphagia, oropharyngeal phase (R13.12);Aphasia (R47.01);Dysarthria and anarthria (R47.1);Apraxia (R48.2)     Continue with current plan of care     [name], M.A., CF-SLP Speech Language Pathology, Acute Rehabilitation Services  Secure Chat preferred [phone]   10/25/2022, 1:02 PM

## 2022-10-25 NOTE — Progress Notes (Signed)
Physical Therapy Treatment Patient Details Name: [name] MRN: [id number] DOB: 11-Aug-1960 Today's Date: 10/25/2022   History of Present Illness Patient is a 62 y/o male admitted 10/03/22 with L weakness and numbness.  Received TNK then developed now symptoms of aphasia and R weakness.  He underwent mechanical thrombectomy L M3 segment MCA noted to have small hemorrhage and follow up CT showed progression of hemorrhage 3cm then 7cm and small acute R cerebellar infarct. PEG placed 7/10. PMH positive for CAD s/p PCI w/stent and 1v CABG, multiple CGAs, systolic HF s/p ICD, HLD, HTN.    PT Comments  Improved trunk control, spending majority of session on EOB, progressing to intermittent min guard with pt utilizing LUE to support himself. Weight shifting and midline control with WB through elbows on alternating sides, min assist to recover from Lt side, Max from Rt. Several attempts to stand with Antony Salmon, Max A-Total +2, needing more assist as he fatigued. Required maximove for transfer OOB to recliner. Patient will continue to benefit from skilled physical therapy services to further improve independence with functional mobility.      Assistance Recommended at Discharge Frequent or constant Supervision/Assistance  If plan is discharge home, recommend the following:  Can travel by private vehicle    Two people to help with walking and/or transfers;A lot of help with bathing/dressing/bathroom;Assistance with cooking/housework;Direct supervision/assist for medications management;Assist for transportation;Help with stairs or ramp for entrance;Assistance with feeding   No  Equipment Recommendations  Other (comment)    Recommendations for Other Services       Precautions / Restrictions Precautions Precautions: Fall Precaution Comments: PEG Restrictions Weight Bearing Restrictions: No     Mobility  Bed Mobility Overal bed mobility: Needs Assistance Bed Mobility: Supine to Sit      Supine to sit: Max assist, +2 for physical assistance, +2 for safety/equipment, HOB elevated     General bed mobility comments: able to assist some with LLE to EOB though questionable motor planning impairments. able to use L UE to assist in lifting trunk with moderate assist. Assistance needed to scoot hips to EOB. Cues for technique throughout.    Transfers Overall transfer level: Needs assistance Equipment used: Ambulation equipment used Transfers: Sit to/from Stand, Bed to chair/wheelchair/BSC Sit to Stand: Max assist, Total assist, +2 physical assistance, +2 safety/equipment           General transfer comment: Max A x 2 with initial stand from elevated bed and use of bed pad. difficulty standing fully upright. Tactile and verbal cues to extend Lt hip and knee with poor response but did push well in attempt to rise fully with LUE on stedy bar. Attempted twice more though bed pad ill positioned and unable to fully lift bottom from bed with Total A x 2. Use of maximove to transfer to recliner from EOB Transfer via Lift Equipment: Maximove  Ambulation/Gait                   Stairs             Wheelchair Mobility     Tilt Bed    Modified Rankin (Stroke Patients Only) Modified Rankin (Stroke Patients Only) Pre-Morbid Rankin Score: Moderate disability Modified Rankin: Severe disability     Balance Overall balance assessment: Needs assistance Sitting-balance support: Feet supported, Bilateral upper extremity supported Sitting balance-Leahy Scale: Poor Sitting balance - Comments: brief periods of min guard with intermittent Mod A w/ posterior lean and questionable fatigue Postural control: Posterior lean  Standing balance support: Bilateral upper extremity supported Standing balance-Leahy Scale: Zero                              Cognition Arousal/Alertness: Awake/alert Behavior During Therapy: Flat affect Overall Cognitive Status: Difficult to  assess Area of Impairment: Attention, Safety/judgement, Awareness, Following commands, Problem solving, Memory                   Current Attention Level: Selective Memory: Decreased short-term memory Following Commands: Follows one step commands with increased time, Follows one step commands inconsistently Safety/Judgement: Decreased awareness of deficits, Decreased awareness of safety Awareness: Intellectual Problem Solving: Slow processing, Requires verbal cues, Requires tactile cues General Comments: able to answer most yes/no questions by nodding head. aphasia limiting open ended responses. inconsistent command following and motor planning impairments        Exercises Other Exercises Other Exercises: NMRE with seated balance, weight shifting onto Lt and Rt elbows with return to midline. Max A from Rt to midline, min A Lt to midline.    General Comments General comments (skin integrity, edema, etc.): Limit sitting 1.5-2hr max for skin protection until he can better offload himself. Pillows in chair for added cushion. RN notified via .      Pertinent Vitals/Pain Pain Assessment Pain Assessment: No/denies pain Pain Intervention(s): Monitored during session, Repositioned    Home Living                          Prior Function            PT Goals (current goals can now be found in the care plan section) Acute Rehab PT Goals Patient Stated Goal: to return home PT Goal Formulation: With patient/family Time For Goal Achievement: 11/01/22 Potential to Achieve Goals: Fair Progress towards PT goals: Progressing toward goals    Frequency    Min 3X/week      PT Plan Current plan remains appropriate    Co-evaluation PT/OT/SLP Co-Evaluation/Treatment: Yes Reason for Co-Treatment: For patient/therapist safety;To address functional/ADL transfers;Complexity of the patient's impairments (multi-system involvement);Necessary to address cognition/behavior during  functional activity PT goals addressed during session: Mobility/safety with mobility;Balance;Proper use of DME OT goals addressed during session: ADL's and self-care;Strengthening/ROM      AM-PAC PT "6 Clicks" Mobility   Outcome Measure  Help needed turning from your back to your side while in a flat bed without using bedrails?: A Lot Help needed moving from lying on your back to sitting on the side of a flat bed without using bedrails?: Total Help needed moving to and from a bed to a chair (including a wheelchair)?: Total Help needed standing up from a chair using your arms (e.g., wheelchair or bedside chair)?: Total Help needed to walk in hospital room?: Total Help needed climbing 3-5 steps with a railing? : Total 6 Click Score: 7    End of Session Equipment Utilized During Treatment: Gait belt Activity Tolerance: Patient tolerated treatment well Patient left: with call bell/phone within reach;in chair;with chair alarm set;with SCD's reapplied Nurse Communication: Mobility status;Need for lift equipment PT Visit Diagnosis: Other abnormalities of gait and mobility (R26.89);Other symptoms and signs involving the nervous system (R29.898);Hemiplegia and hemiparesis Hemiplegia - Right/Left: Right Hemiplegia - dominant/non-dominant: Dominant Hemiplegia - caused by: Cerebral infarction;Other Nontraumatic intracranial hemorrhage     Time: 1610-9604 PT Time Calculation (min) (ACUTE ONLY): 35 min  Charges:    $Therapeutic  Activity: 8-22 mins PT General Charges $$ ACUTE PT VISIT: 1 Visit                     Kathlyn Sacramento, PT, DPT [facility]  Rehabilitation Services Physical Therapist Office: [phone] Website: [email or web address]    Berton Mount 10/25/2022, 3:10 PM

## 2022-10-25 NOTE — Progress Notes (Signed)
Occupational Therapy Treatment Patient Details Name: [name] MRN: [id number] DOB: 1961-03-23 Today's Date: 10/25/2022   History of present illness Patient is a 62 y/o male admitted 10/03/22 with L weakness and numbness.  Received TNK then developed now symptoms of aphasia and R weakness.  He underwent mechanical thrombectomy L M3 segment MCA noted to have small hemorrhage and follow up CT showed progression of hemorrhage 3cm then 7cm and small acute R cerebellar infarct. PEG placed 7/10. PMH positive for CAD s/p PCI w/stent and 1v CABG, multiple CGAs, systolic HF s/p ICD, HLD, HTN.   OT comments  Pt with incremental progress towards OT goals though remains limited by significant R sided weakness and impaired communication. Pt able to demonstrate some improvements in bed mobility and sitting balance though extensive assist still required due to difficulty motor planning. Pt able to participate briefly in standing trials with Stedy with Max-Total A x 2 though ultimately required the maximove lift to transfer OOB to recliner.    Recommendations for follow up therapy are one component of a multi-disciplinary discharge planning process, led by the attending physician.  Recommendations may be updated based on patient status, additional functional criteria and insurance authorization.    Assistance Recommended at Discharge Frequent or constant Supervision/Assistance  Patient can return home with the following  Two people to help with walking and/or transfers;Two people to help with bathing/dressing/bathroom;Assistance with cooking/housework;Assistance with feeding;Help with stairs or ramp for entrance;Direct supervision/assist for financial management;Assist for transportation;Direct supervision/assist for medications management   Equipment Recommendations  Other (comment) (TBD)    Recommendations for Other Services      Precautions / Restrictions Precautions Precautions: Fall Precaution  Comments: PEG Restrictions Weight Bearing Restrictions: No       Mobility Bed Mobility Overal bed mobility: Needs Assistance Bed Mobility: Supine to Sit     Supine to sit: Max assist, +2 for physical assistance, +2 for safety/equipment, HOB elevated     General bed mobility comments: able to assist some with LLE to EOB though questionable motor planning impairments. able to use L UE to assist in lifting trunk with moderate assist. Assistance needed to scoot hips to EOB    Transfers Overall transfer level: Needs assistance Equipment used: Ambulation equipment used Transfers: Sit to/from Stand, Bed to chair/wheelchair/BSC Sit to Stand: Max assist, Total assist, +2 physical assistance, +2 safety/equipment           General transfer comment: Max A x 2 with initial stand from elevated bed and use of bed pad. difficulty standing fully upright. Attempted twice more though bed pad ill positioned and unable to fully lift bottom from bed with Total A x 2. Use of maximove to transfer to recliner from EOB Transfer via Lift Equipment: Maximove   Balance Overall balance assessment: Needs assistance Sitting-balance support: Feet supported, Bilateral upper extremity supported Sitting balance-Leahy Scale: Poor Sitting balance - Comments: brief periods of min guard with intermittent Mod A w/ posterior lean and questionable fatigue Postural control: Posterior lean Standing balance support: Bilateral upper extremity supported Standing balance-Leahy Scale: Zero                             ADL either performed or assessed with clinical judgement   ADL Overall ADL's : Needs assistance/impaired                     Lower Body Dressing: Total assistance;Bed level  General ADL Comments: Focus on EOB attempts, sitting balance, command following    Extremity/Trunk Assessment Upper Extremity Assessment Upper Extremity Assessment: RUE deficits/detail RUE  Deficits / Details: able to  make fist and extend digits partially actively. able to lift UE from bed to abdomen but difficulty replicating movements on command   Lower Extremity Assessment Lower Extremity Assessment: Defer to PT evaluation        Vision   Vision Assessment?: No apparent visual deficits   Perception     Praxis      Cognition Arousal/Alertness: Awake/alert Behavior During Therapy: Flat affect Overall Cognitive Status: Difficult to assess Area of Impairment: Attention, Safety/judgement, Awareness, Following commands, Problem solving, Memory                   Current Attention Level: Selective Memory: Decreased short-term memory Following Commands: Follows one step commands with increased time, Follows one step commands inconsistently Safety/Judgement: Decreased awareness of deficits, Decreased awareness of safety Awareness: Intellectual Problem Solving: Slow processing, Requires verbal cues, Requires tactile cues General Comments: able to answer most yes/no questions by nodding head. aphasia limiting open ended responses. inconsistent command following and motor planning impairments        Exercises      Shoulder Instructions       General Comments      Pertinent Vitals/ Pain       Pain Assessment Pain Assessment: No/denies pain Faces Pain Scale: No hurt Pain Intervention(s): Monitored during session  Home Living                                          Prior Functioning/Environment              Frequency  Min 2X/week        Progress Toward Goals  OT Goals(current goals can now be found in the care plan section)  Progress towards OT goals: Progressing toward goals  Acute Rehab OT Goals OT Goal Formulation: Patient unable to participate in goal setting Time For Goal Achievement: 11/06/22 Potential to Achieve Goals: Fair ADL Goals Pt Will Perform Grooming: with min assist;sitting;bed level Pt Will Perform  Upper Body Bathing: with mod assist;sitting Pt Will Perform Lower Body Bathing: with mod assist;sitting/lateral leans Pt Will Perform Upper Body Dressing: with mod assist;sitting Pt Will Perform Lower Body Dressing: with mod assist;sitting/lateral leans Pt Will Transfer to Toilet: squat pivot transfer;stand pivot transfer;bedside commode;with mod assist;with +2 assist Additional ADL Goal #1: Patient will tolerate R resting hand splint 4 hours on and 4 off with no skin integrity issues.  Plan Discharge plan remains appropriate    Co-evaluation    PT/OT/SLP Co-Evaluation/Treatment: Yes Reason for Co-Treatment: For patient/therapist safety;To address functional/ADL transfers   OT goals addressed during session: ADL's and self-care;Strengthening/ROM      AM-PAC OT "6 Clicks" Daily Activity     Outcome Measure   Help from another person eating meals?: Total Help from another person taking care of personal grooming?: A Lot Help from another person toileting, which includes using toliet, bedpan, or urinal?: Total Help from another person bathing (including washing, rinsing, drying)?: Total Help from another person to put on and taking off regular upper body clothing?: Total Help from another person to put on and taking off regular lower body clothing?: Total 6 Click Score: 7    End of Session Equipment Utilized During Treatment:  Gait belt  OT Visit Diagnosis: Unsteadiness on feet (R26.81);Other abnormalities of gait and mobility (R26.89);Muscle weakness (generalized) (M62.81);Cognitive communication deficit (R41.841);Hemiplegia and hemiparesis;Low vision, both eyes (H54.2);Apraxia (R48.2);Other symptoms and signs involving cognitive function Symptoms and signs involving cognitive functions: Cerebral infarction Hemiplegia - Right/Left: Right Hemiplegia - dominant/non-dominant: Dominant Hemiplegia - caused by: Cerebral infarction   Activity Tolerance Patient tolerated treatment well    Patient Left in chair;with call bell/phone within reach;with chair alarm set   Nurse Communication Mobility status;Need for lift equipment        Time: 5784-6962 OT Time Calculation (min): 35 min  Charges: OT General Charges $OT Visit: 1 Visit OT Treatments $Therapeutic Activity: 8-22 mins  Bradd Canary, OTR/L Acute Rehab Services Office: [phone]   Lorre Munroe 10/25/2022, 2:36 PM

## 2022-10-25 NOTE — TOC Progression Note (Signed)
Transition of Care [facility]) - Progression Note    Patient Details  Name: [name] MRN: [id number] Date of Birth: February 13, 1961  [facility] CM/SW Contact  Baldemar Lenis, [place] Phone Number: 10/25/2022, 4:25 PM  Clinical Narrative:   CSW attempted to contact patient's son, [name], to discuss SNF choice. Left a voicemail, awaiting call back. CSW to follow.    Expected Discharge Plan: Skilled Nursing Facility Barriers to Discharge: Continued Medical Work up, English as a second language teacher, SNF Pending bed offer  Expected Discharge Plan and Services In-house Referral: Clinical Social Work     Living arrangements for the past 2 months: Apartment                                       Social Determinants of Health (SDOH) Interventions SDOH Screenings   Food Insecurity: Patient Unable To Answer (10/11/2022)  Housing: Patient Unable To Answer (10/11/2022)  Transportation Needs: Patient Unable To Answer (10/11/2022)  Utilities: Patient Unable To Answer (10/11/2022)  Depression (PHQ2-9): Low Risk  (05/08/2022)  Social Connections: Unknown (08/28/2021)   Received from [facility]  Tobacco Use: High Risk (10/24/2022)    Readmission Risk Interventions     No data to display

## 2022-10-25 NOTE — Care Management Important Message (Signed)
Important Message  Patient Details  Name: FAHIM KATS MRN: [id number] Date of Birth: 1960/07/27   Medicare Important Message Given:  Yes     Sherilyn Banker 10/25/2022, 12:20 PM

## 2022-10-26 DIAGNOSIS — I5023 Acute on chronic systolic (congestive) heart failure: Secondary | ICD-10-CM | POA: Diagnosis not present

## 2022-10-26 DIAGNOSIS — I63413 Cerebral infarction due to embolism of bilateral middle cerebral arteries: Secondary | ICD-10-CM | POA: Diagnosis not present

## 2022-10-26 DIAGNOSIS — Z66 Do not resuscitate: Secondary | ICD-10-CM | POA: Diagnosis not present

## 2022-10-26 DIAGNOSIS — R1312 Dysphagia, oropharyngeal phase: Secondary | ICD-10-CM | POA: Diagnosis not present

## 2022-10-26 DIAGNOSIS — I639 Cerebral infarction, unspecified: Secondary | ICD-10-CM | POA: Diagnosis not present

## 2022-10-26 DIAGNOSIS — I253 Aneurysm of heart: Secondary | ICD-10-CM | POA: Diagnosis not present

## 2022-10-26 DIAGNOSIS — R7881 Bacteremia: Secondary | ICD-10-CM | POA: Diagnosis not present

## 2022-10-26 LAB — GLUCOSE, CAPILLARY
Glucose-Capillary: 111 mg/dL — ABNORMAL HIGH (ref 70–99)
Glucose-Capillary: 112 mg/dL — ABNORMAL HIGH (ref 70–99)
Glucose-Capillary: 118 mg/dL — ABNORMAL HIGH (ref 70–99)
Glucose-Capillary: 126 mg/dL — ABNORMAL HIGH (ref 70–99)
Glucose-Capillary: 178 mg/dL — ABNORMAL HIGH (ref 70–99)
Glucose-Capillary: 267 mg/dL — ABNORMAL HIGH (ref 70–99)

## 2022-10-26 LAB — CBC
HCT: 41.8 % (ref 39.0–52.0)
Hemoglobin: 14 g/dL (ref 13.0–17.0)
MCH: 29.5 pg (ref 26.0–34.0)
MCHC: 33.5 g/dL (ref 30.0–36.0)
MCV: 88 fL (ref 80.0–100.0)
Platelets: 159 10*3/uL (ref 150–400)
RBC: 4.75 MIL/uL (ref 4.22–5.81)
RDW: 12.8 % (ref 11.5–15.5)
WBC: 8.7 10*3/uL (ref 4.0–10.5)
nRBC: 0 % (ref 0.0–0.2)

## 2022-10-26 LAB — TYPE AND SCREEN
ABO/RH(D): O POS
Antibody Screen: NEGATIVE

## 2022-10-26 LAB — BASIC METABOLIC PANEL
Anion gap: 11 (ref 5–15)
BUN: 54 mg/dL — ABNORMAL HIGH (ref 8–23)
CO2: 24 mmol/L (ref 22–32)
Calcium: 9.1 mg/dL (ref 8.9–10.3)
Chloride: 100 mmol/L (ref 98–111)
Creatinine, Ser: 1.12 mg/dL (ref 0.61–1.24)
GFR, Estimated: >60 mL/min (ref >60)
Glucose, Bld: 218 mg/dL — ABNORMAL HIGH (ref 70–99)
Potassium: 4.1 mmol/L (ref 3.5–5.1)
Sodium: 135 mmol/L (ref 135–145)

## 2022-10-26 NOTE — Progress Notes (Signed)
Physical Therapy Treatment Patient Details Name: [name] MRN: [id number] DOB: [birth year] Today's Date: 10/26/2022   History of Present Illness Patient is a 62 y/o male admitted 10/03/22 with L weakness and numbness.  Received TNK then developed now symptoms of aphasia and R weakness.  He underwent mechanical thrombectomy L M3 segment MCA noted to have small hemorrhage and follow up CT showed progression of hemorrhage 3cm then 7cm and small acute R cerebellar infarct. PEG placed 7/10. PMH positive for CAD s/p PCI w/stent and 1v CABG, multiple CGAs, systolic HF s/p ICD, HLD, HTN.    PT Comments  Good effort from patient today, eager to sit up and work on balance. Performed NMRE seated at EOB for majority of session with lateral and A/P weight shifting, leaning onto elbows, recovering to midline for orientation and proprioceptive awareness. Intermittent pushing towards hemiparetic side with LUE. Tolerated LE exercises well. Repositioned for comfort with slight offloading from pillows on Lt side as pt was prior to session. Still difficulty with verbal communication but nodding appropriately. Difficulty with facilitating LUE without tactile cues. Patient will continue to benefit from skilled physical therapy services to further improve independence with functional mobility.      Assistance Recommended at Discharge Frequent or constant Supervision/Assistance  If plan is discharge home, recommend the following:  Can travel by private vehicle    Two people to help with walking and/or transfers;A lot of help with bathing/dressing/bathroom;Assistance with cooking/housework;Direct supervision/assist for medications management;Assist for transportation;Help with stairs or ramp for entrance;Assistance with feeding   No  Equipment Recommendations  Other (comment)    Recommendations for Other Services Rehab consult     Precautions / Restrictions Precautions Precautions: Fall Precaution  Comments: PEG Restrictions Weight Bearing Restrictions: No     Mobility  Bed Mobility Overal bed mobility: Needs Assistance Bed Mobility: Rolling, Sidelying to Sit, Sit to Sidelying Rolling: Mod assist Sidelying to sit: Max assist     Sit to sidelying: Mod assist General bed mobility comments: Able to flex Lt knee and with hand over hand guidance reach for rail with LUE (unable to facilitate LUE movement soley with VC.) Once holding rail able to grasp and pull to assist with roll onto side at mod A level. Max A for LEs out of bed and trunk support to rise, VC to release rail and reach towards opposite side. Pushing towards hemiparetic side once EOB.    Transfers                   General transfer comment: Deferred due to 1+ assist only available. Pt also preferred to work on EOB activities and not sit up in chair this afternoon due to recent PEG issue. Encouraged OOB with maxilift and nursing over the weekend.    Ambulation/Gait               General Gait Details: unable   Stairs             Wheelchair Mobility     Tilt Bed    Modified Rankin (Stroke Patients Only) Modified Rankin (Stroke Patients Only) Pre-Morbid Rankin Score: Moderate disability Modified Rankin: Severe disability     Balance Overall balance assessment: Needs assistance Sitting-balance support: Feet supported, Single extremity supported Sitting balance-Leahy Scale: Poor Sitting balance - Comments: Tolerated brief periods of min guard but often min assist due to instability. leaning forward and occasionally pushing towards right. Postural control: Right lateral lean, Other (comment) (Forward lean)  Cognition Arousal/Alertness: Awake/alert Behavior During Therapy: Flat affect Overall Cognitive Status: Difficult to assess Area of Impairment: Attention, Safety/judgement, Awareness, Following commands, Problem solving, Memory                    Current Attention Level: Selective Memory: Decreased short-term memory Following Commands: Follows one step commands with increased time, Follows one step commands inconsistently Safety/Judgement: Decreased awareness of deficits, Decreased awareness of safety Awareness: Intellectual Problem Solving: Slow processing, Requires verbal cues, Requires tactile cues General Comments: able to answer most yes/no questions by nodding head. aphasia limiting open ended responses. inconsistent command following and motor planning impairments.        Exercises General Exercises - Lower Extremity Ankle Circles/Pumps: PROM, AROM, Both, 10 reps, Supine Long Arc Quad: Strengthening, Left, AAROM, 10 reps, Seated Heel Slides: AAROM, Both, 10 reps, Seated Hip ABduction/ADduction: AAROM, Strengthening, Both, 10 reps, Supine Hip Flexion/Marching: Both, PROM, AAROM, Strengthening, 10 reps, Supine Other Exercises Other Exercises: NMRE with seated balance, weight shifting onto Lt and Rt elbows with return to midline. Performed min assist from both sides today with cues. Progressed with A/P leaning and return to midline with min assist (limited LOS.) Other Exercises: Emphasized scanning room, target finding, head turn with eyes, and tracking while seated EOB. Frequent cues for posture and to bring gaze upwards.    General Comments        Pertinent Vitals/Pain Pain Assessment Pain Assessment: No/denies pain Pain Intervention(s): Monitored during session    Home Living                          Prior Function            PT Goals (current goals can now be found in the care plan section) Acute Rehab PT Goals Patient Stated Goal: to return home PT Goal Formulation: With patient/family Time For Goal Achievement: 11/01/22 Potential to Achieve Goals: Fair Progress towards PT goals: Progressing toward goals    Frequency    Min 3X/week      PT Plan Current plan remains  appropriate    Co-evaluation              AM-PAC PT "6 Clicks" Mobility   Outcome Measure  Help needed turning from your back to your side while in a flat bed without using bedrails?: A Lot Help needed moving from lying on your back to sitting on the side of a flat bed without using bedrails?: A Lot Help needed moving to and from a bed to a chair (including a wheelchair)?: Total Help needed standing up from a chair using your arms (e.g., wheelchair or bedside chair)?: Total Help needed to walk in hospital room?: Total Help needed climbing 3-5 steps with a railing? : Total 6 Click Score: 8    End of Session Equipment Utilized During Treatment: Gait belt Activity Tolerance: Patient tolerated treatment well Patient left: with call bell/phone within reach;with SCD's reapplied;in bed;with bed alarm set Nurse Communication: Mobility status;Need for lift equipment PT Visit Diagnosis: Other abnormalities of gait and mobility (R26.89);Other symptoms and signs involving the nervous system (R29.898);Hemiplegia and hemiparesis Hemiplegia - Right/Left: Right Hemiplegia - dominant/non-dominant: Dominant Hemiplegia - caused by: Cerebral infarction;Other Nontraumatic intracranial hemorrhage     Time: 1610-9604 PT Time Calculation (min) (ACUTE ONLY): 25 min  Charges:    $Therapeutic Activity: 8-22 mins $Neuromuscular Re-education: 8-22 mins PT General Charges $$ ACUTE PT VISIT: 1 Visit  Kathlyn Sacramento, PT, DPT [facility]  Rehabilitation Services Physical Therapist Office: [phone] Website: [email or web address]    Berton Mount 10/26/2022, 4:46 PM

## 2022-10-26 NOTE — Plan of Care (Signed)
  Problem: Education: Goal: Knowledge of disease or condition will improve Outcome: Progressing Goal: Knowledge of secondary prevention will improve (MUST DOCUMENT ALL) Outcome: Progressing Goal: Knowledge of patient specific risk factors will improve (Mark N/A or DELETE if not current risk factor) Outcome: Progressing   Problem: Ischemic Stroke/TIA Tissue Perfusion: Goal: Complications of ischemic stroke/TIA will be minimized Outcome: Progressing   Problem: Self-Care: Goal: Ability to participate in self-care as condition permits will improve Outcome: Progressing Goal: Verbalization of feelings and concerns over difficulty with self-care will improve Outcome: Progressing Goal: Ability to communicate needs accurately will improve Outcome: Progressing   

## 2022-10-26 NOTE — TOC Progression Note (Signed)
Transition of Care [facility]) - Progression Note    Patient Details  Name: [name] MRN: [id number] Date of Birth: July 23, 1960  Transition of Care [facility]) CM/SW Contact  Baldemar Lenis, [place] Phone Number: 10/26/2022, 10:13 AM  Clinical Narrative:   CSW spoke with patient's son, [name], to discuss SNF choices. [name] has not had a chance to speak with the other family members, will do so after work tonight to make a choice for his dad. CSW informed the son that now that patient has his peg tube, he is approaching stability for SNF. [name] indicated understanding, asked for someone to call him again over the weekend for his choice. Hiren will call CSW if he's able to make a decision today. CSW to follow.    Expected Discharge Plan: Skilled Nursing Facility Barriers to Discharge: Continued Medical Work up, English as a second language teacher, SNF Pending bed offer  Expected Discharge Plan and Services In-house Referral: Clinical Social Work     Living arrangements for the past 2 months: Apartment                                       Social Determinants of Health (SDOH) Interventions SDOH Screenings   Food Insecurity: Patient Unable To Answer (10/11/2022)  Housing: Patient Unable To Answer (10/11/2022)  Transportation Needs: Patient Unable To Answer (10/11/2022)  Utilities: Patient Unable To Answer (10/11/2022)  Depression (PHQ2-9): Low Risk  (05/08/2022)  Social Connections: Unknown (08/28/2021)   Received from [facility]  Tobacco Use: High Risk (10/24/2022)    Readmission Risk Interventions     No data to display

## 2022-10-26 NOTE — Progress Notes (Signed)
TRH night cross cover note:   I was notified by RN that there is evidence of bleeding at the PEG site, which is easing following associated dressing change. Currently on heparin drip. VS appear stable with BP 109/70 and HR in the 80's. No new symptoms reported. Will hold hep drip for now, pursue STAT cbc, and order type and screen, with plan for rounding hospitalist to re-evaluate this morning to help determine timing of resumption of heparin drip.     Newton Pigg, DO Hospitalist

## 2022-10-26 NOTE — Progress Notes (Signed)
Patient ID: [name], male   DOB: October 02, 1960, 62 y.o.   MRN: [id number]    Progress Note from the Palliative Medicine Team at [facility]   Patient Name: [name]        Date: 10/26/2022 DOB: July 21, 1960  Age: 62 y.o. MRN#: [id number] Attending Physician: Alberteen Sam, * Primary Care Physician: [name], MD Admit Date: 10/02/2022   Extensive chart review has been completed prior to meeting with patient/family  including labs, vital signs, imaging, progress/consult notes, orders, medications and available advance directive documents.    62 y/o male who presented 6/19 with L weakness and numbness. Pt received TNK then developed new symptoms of aphasia and R weakness. He underwent mechanical thrombectomy L M3 segment MCA 6/19. Pt noted to have small hemorrhage and follow up CT showed progression of hemorrhage 3cm then 7cm and small acute R cerebellar infarct. PMH: CAD s/p PCI w/stent and 1v CABG, multiple CGAs, systolic HF s/p ICD, HLD, HTN.  Initial PMT consult 10-16-2022  This NP assessed patient at the bedside as a follow up palliative medicine needs and emotional support, no family at bedside.  Patient is status post PEG placement  Patient is alert, he attempts his speech, it is only guttural sounds.  He is following simple commands.  I left a phone message with patient's son [name] and poked by phone with patient's SO [name].    Ongoing education regarding patient's current medical situation and the likely projected trajectory at the skilled nursing facility for continued rehabilitation;   increased risk of infections; aspiration , upper respiratory, UTI, skin.   [name] verbalizes a clear understanding of patient's current medical situation.  Family remain hopeful for improvement   Plan of Care: -DNR/DNI- documented today - PEG  for nutritional support -continue with all offered and available medical interventions to prolong life -When  medically stable SNF for ongoing rehab/recommend outpatient palliative care services at discharge  Questions and concerns addressed   Education offered today regarding  the importance of continued conversation with family and the medical providers regarding overall plan of care and treatment options,  ensuring decisions are within the context of the patients values and GOCs.  Time: 50   minutes  Detailed review of medical records ( labs, imaging, vital signs), medically appropriate exam ( MS, skin, resp)   discussed with treatment team, counseling and education to patient, family, staff, documenting clinical information, medication management, coordination of care    [name] NP  Palliative Medicine Team Team Phone # [phone] Pager [phone]

## 2022-10-26 NOTE — Progress Notes (Signed)
Progress Note   Patient: [name] ZOX:[id number] DOB: February 02, 1961 DOA: 10/02/2022     24 DOS: the patient was seen and examined on 10/26/2022 at 9:07AM      Brief hospital course: [name] is a 62 y.o. 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     Significant events: 6/18: Admitted for stroke, received TNK; developed right sided deficits overnight after TNK, underwent NIR thrombectomy 6/19: Post thrombectomy CT showed progression of left frontal IPH and SAH 6/23: Repeat echo shows dehiscence of prior pseudoaneurysm patch with adherent LV thrombus; Cardiology consulted 6/26: ID consulted for MSSE bacteremia 6/28: EP consulted, recommended not to take out ICD 7/2: Transferred OOU 7/10: Placed PEG tube 7/11: Transitioned to basal bolus feedings and switched to Eliquis    Significant studies: 6/18 CTA head and neck: no LVO 6/18 repeat CTA head and neck: new distal left MCA M2 occlusion 6/19 Echo: poor quality images, nondiagnostic 6/19 TCD: positive study, indicating R>L shunt 6/19 LE doppler: no DVT 6/19 CT head: left frontal parietal IPH and adjacent [facility] 6/22 CT head: size now 8x4x4cm, with new 2cm component 6/23 complete echo: now shows recurrence of pseudoaneurysm, dehiscence of patch with adherent thrombus 6/25 coronary CTA: confirms above 6/28 CT head: stable large left frontoparietal hematoma 7/15 TEE: pending   Significant microbiology data: 6/18 MRSA nares: Negative 6/25 Blood cultures: MSSE in 2/2 6/27 blood cultures: NG in 2/2    Procedures: 6/18: TNK at 1246 6/19: Thrombectomy by [name] 6/19: Cortrak placed 7/10: PEG placed    Consults: Cardiology Infectious disease Electrophysiology Critical Care Palliative Care General Surgery      Assessment and Plan: * Stroke  (cerebrum) (HCC) Intraparenchymal hematoma Given his hematoma, and that he has now completed 10 days IV therapeutic dose heparin, we will start with apixaban 5 mg BID - Continue Lipitor - Continue Eliquis    Bacteremia due to Gram-positive bacteria - Continue cefazolin - Plan for TEE on Monday   Pseudoaneurysm of left ventricle of heart Evaluated by Cardiology.   - Continue Eliquis - Ultimately, if his neuro recovery allows, he would need long term anticoagulation until he is felt to be healthy enough to undergo repeat pseudoaneurysm repair (a very high risk operation).   Cerebral edema (HCC) Treated with hypertonic saline.  Stable and no further 3% saline required.   Thrombocytopenia (HCC) Resolved.  Aspiration pneumonia (HCC) Treated with Rocephin to cefazolin, now resolevd.  Acute on chronic systolic CHF (congestive heart failure) (HCC) Treated and now appears euvolemic - Continue Entresto, BB, spiro, Lasix  ICD (implantable cardioverter-defibrillator) in place    Obesity (BMI 30.0-34.9) BMI 32.2  Essential hypertension - Continue Entresto, Coreg, Lasix, metoprolol, spironolactone  Dyslipidemia - Continue Lipitor  Dysphagia PEG placed 7/10.  Some oozing from his PEG site overnight - Continue tube feeds - Monitor for bleeding, okay to continue Eliquis now  Coronary artery disease    Acute respiratory failure with hypoxia (HCC) On 6/19 and 6/20 was tachypneic to the high 20s and hypoxic, requiring 4L nasal cannula.  Due to aspiration pneumonia and CHF.  Treated and resolved.            Subjective: Some bleeding from his PEG site overnight, low volume, easily stopped by itself.  No pain, no dyspnea, no other nursing concerns, no  fever.     Physical Exam: BP 96/63 (BP Location: Left Arm)   Pulse 86   Temp 97.7 F (36.5 C) (Oral)   Resp 18   Ht 6\' 1"  (1.854 m)   Wt 99.1 kg   SpO2 (!) 64%   BMI 28.82 kg/m   L, lying in bed, interactive and  appropriate RRR, no murmurs, no peripheral edema Respiratory rate normal, lungs clear without rales or wheezes Abdomen soft without tenderness palpation Right side flaccid, left side 4/5, heavily dysarthric, answers appropriately yes or no with head nodding and shaking head    Data Reviewed: Patient metabolic panel and CBC unremarkable Glucose normal  Family Communication: Wife by phone    Disposition: Status is: Inpatient         Author: Alberteen Sam, MD 10/26/2022 3:00 PM  For on call review [email or web address].

## 2022-10-26 NOTE — Progress Notes (Addendum)
Nursing at bedside for 0600 bolus feeding, pt gown and ABD binder wet from blood, pt bleeding from PEG tube site, pt was on Heparin drip, MD notified, STAT CBC ordered and type and screen. Area cleansed and split guaze applied, bleeding did slow after dx changed and ABD binder removed. At this time  VSS, pt shows no signs of distress, linen changed, call bell in reach

## 2022-10-26 NOTE — Progress Notes (Signed)
    [facility] has been requested to perform a transesophageal echocardiogram on [name] for bacteremia.  Patient is a 62 year old male with a past medical history of CAD, ICD in place, history of myopericarditis, pseudoaneurysm of left ventricle s/p cormatrix patch in 2014, HTN, HLD, prior CVA. Patient is currently admitted with CVA, bacteremia. Echocardiogram 10/07/22 showed a large inferior pseudoaneurysm with a depth of 3 cm and a maximum width of roughly 5 cm, EF 30-35%. Overall, findings concerning for redevelopment of pseudoaneurysm of the inferior wall that was repaired in 2014. CT coronary on 10/09/22 showed an intact chronic patch repair to inferior/inferior septal pseudoaneurysm, large burden of thrombus on top of patch repair, severe biventricular failure, occluded SVG-OM.   On interview, patient is dysarthric but appropriate in answers - able to shake his head yes and no clearly   After careful review of history and examination, the risks and benefits of transesophageal echocardiogram have been explained including risks of esophageal damage, perforation (1:10,000 risk), bleeding, pharyngeal hematoma as well as other potential complications associated with conscious sedation including aspiration, arrhythmia, respiratory failure and death. Alternatives to treatment were discussed, questions were answered. Patient is willing to proceed.   Jonita Albee, PA-C 10/26/2022 2:25 PM

## 2022-10-26 NOTE — Progress Notes (Signed)
Trauma/Critical Care Follow Up Note  Subjective:    Overnight Issues:  Ask to re-eval site due to bleeding around g-tube.   Objective:  Vital signs for last 24 hours: Temp:  [97.7 F (36.5 C)-98 F (36.7 C)] 97.9 F (36.6 C) (07/12 0812) Pulse Rate:  [80-88] 83 (07/12 0812) Resp:  [16-27] 18 (07/12 0352) BP: (91-109)/(63-80) 103/80 (07/12 0812) SpO2:  [94 %-98 %] 98 % (07/12 0812) Weight:  [99.1 kg] 99.1 kg (07/12 0424)   Intake/Output from previous day: 07/11 0701 - 07/12 0700 In: 700.1 [IV Piggyback:700.1] Out: 2200 [Urine:2200]  Intake/Output this shift: No intake/output data recorded.   Physical Exam:  Abd: soft, NT, ND, incisions c/d/I, g-tube in place and currently bleeding has stopped, but his bandage is soaked in blood.  This was removed and a clean bandage placed.  Results for orders placed or performed during the hospital encounter of 10/02/22 (from the past 24 hour(s))  Glucose, capillary     Status: Abnormal   Collection Time: 10/25/22 12:54 PM  Result Value Ref Range   Glucose-Capillary 141 (H) 70 - 99 mg/dL  Glucose, capillary     Status: Abnormal   Collection Time: 10/25/22  3:52 PM  Result Value Ref Range   Glucose-Capillary 128 (H) 70 - 99 mg/dL  Glucose, capillary     Status: Abnormal   Collection Time: 10/25/22  8:14 PM  Result Value Ref Range   Glucose-Capillary 207 (H) 70 - 99 mg/dL  Glucose, capillary     Status: None   Collection Time: 10/25/22 11:22 PM  Result Value Ref Range   Glucose-Capillary 87 70 - 99 mg/dL  Glucose, capillary     Status: Abnormal   Collection Time: 10/26/22  3:51 AM  Result Value Ref Range   Glucose-Capillary 126 (H) 70 - 99 mg/dL  Type and screen [facility]     Status: None   Collection Time: 10/26/22  6:47 AM  Result Value Ref Range   ABO/RH(D) O POS    Antibody Screen NEG    Sample Expiration      10/29/2022,2359 Performed at [facility], [address] [place], [place]  [address]   Basic metabolic panel     Status: Abnormal   Collection Time: 10/26/22  6:50 AM  Result Value Ref Range   Sodium 135 135 - 145 mmol/L   Potassium 4.1 3.5 - 5.1 mmol/L   Chloride 100 98 - 111 mmol/L   CO2 24 22 - 32 mmol/L   Glucose, Bld 218 (H) 70 - 99 mg/dL   BUN 54 (H) 8 - 23 mg/dL   Creatinine, Ser 1.91 0.61 - 1.24 mg/dL   Calcium 9.1 8.9 - 47.8 mg/dL   GFR, Estimated >29 >56 mL/min   Anion gap 11 5 - 15  CBC     Status: None   Collection Time: 10/26/22  6:50 AM  Result Value Ref Range   WBC 8.7 4.0 - 10.5 K/uL   RBC 4.75 4.22 - 5.81 MIL/uL   Hemoglobin 14.0 13.0 - 17.0 g/dL   HCT 21.3 08.6 - 57.8 %   MCV 88.0 80.0 - 100.0 fL   MCH 29.5 26.0 - 34.0 pg   MCHC 33.5 30.0 - 36.0 g/dL   RDW 46.9 62.9 - 52.8 %   Platelets 159 150 - 400 K/uL   nRBC 0.0 0.0 - 0.2 %  Glucose, capillary     Status: Abnormal   Collection Time: 10/26/22  8:14 AM  Result Value Ref Range   Glucose-Capillary 118 (H) 70 - 99 mg/dL   Comment 1 Notify RN     Assessment & Plan:  Present on Admission:  Stroke (cerebrum) (HCC)  Bacteremia due to Gram-positive bacteria  Dyslipidemia  Essential hypertension  ICD (implantable cardioverter-defibrillator) in place  Obesity (BMI 30.0-34.9)  Coronary artery disease  Acute on chronic systolic CHF (congestive heart failure) (HCC)  Pseudoaneurysm of left ventricle of heart  Acute respiratory failure with hypoxia (HCC)    LOS: 24 days   Additional comments:I reviewed the patient's new clinical lab test results.   and I reviewed the patients new imaging test results.    POD 2, s/p lap assisted g-tube placement, Dr. Bedelia Person 7/10 for CVA with dysphagia  -he has had some bleeding around his g-tube site.  This appears to have clotted off as it is not actively bleeding upon my evaluation.  I have placed a new, clean, dry dressing. -tolerating bolus feeds -all incisions are sutured subcutaneously with dermabond present -he does not need surgical follow  up at this, except on a prn basis. -we will recheck site tomorrow.  If this remains a problem, may need to hold his Eliquis.   FEN: TFs VTE: Eliquis ID: on ancef for bacteremia  Letha Cape, PA-C Trauma & General Surgery Please use [email or web address] to contact on call provider  10/26/2022  *Care during the described time interval was provided by me. I have reviewed this patient's available data, including medical history, events of note, physical examination and test results as part of my evaluation.

## 2022-10-27 ENCOUNTER — Inpatient Hospital Stay (HOSPITAL_COMMUNITY): Payer: Medicare PPO

## 2022-10-27 DIAGNOSIS — R1312 Dysphagia, oropharyngeal phase: Secondary | ICD-10-CM

## 2022-10-27 DIAGNOSIS — I639 Cerebral infarction, unspecified: Secondary | ICD-10-CM | POA: Diagnosis not present

## 2022-10-27 DIAGNOSIS — Z66 Do not resuscitate: Secondary | ICD-10-CM

## 2022-10-27 LAB — GLUCOSE, CAPILLARY
Glucose-Capillary: 105 mg/dL — ABNORMAL HIGH (ref 70–99)
Glucose-Capillary: 156 mg/dL — ABNORMAL HIGH (ref 70–99)
Glucose-Capillary: 204 mg/dL — ABNORMAL HIGH (ref 70–99)
Glucose-Capillary: 212 mg/dL — ABNORMAL HIGH (ref 70–99)
Glucose-Capillary: 213 mg/dL — ABNORMAL HIGH (ref 70–99)
Glucose-Capillary: 85 mg/dL (ref 70–99)

## 2022-10-27 MED ORDER — OXYCODONE HCL 5 MG PO TABS
2.5000 mg | ORAL_TABLET | Freq: Three times a day (TID) | ORAL | Status: DC | PRN
  Administered 2022-10-27: 2.5 mg via ORAL
  Filled 2022-10-27: qty 1

## 2022-10-27 MED ORDER — OXYCODONE HCL 5 MG PO TABS
2.5000 mg | ORAL_TABLET | Freq: Three times a day (TID) | ORAL | Status: DC | PRN
  Administered 2022-10-27 – 2022-10-28 (×2): 5 mg
  Filled 2022-10-27 (×2): qty 1

## 2022-10-27 MED ORDER — OXYCODONE HCL 5 MG PO TABS: 2.5000 mg | ORAL_TABLET | Freq: Three times a day (TID) | ORAL | Status: DC | PRN

## 2022-10-27 NOTE — Progress Notes (Signed)
  Progress Note   Patient: [name] ZOX:[id number] DOB: January 17, 1961 DOA: 10/02/2022     25 DOS: the patient was seen and examined on 10/27/2022 at 8:24AM      Brief hospital course: Mr. Mcwhinnie is a 62 y.o. 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  Subsequent hospital stay complicated by IPH, MSSE bacteremia.        Assessment and Plan: * Stroke (cerebrum) (HCC) Intraparenchymal hematoma Dysphagia s/p PEG Mild bleeding again overnight, stopped spontaneously.  Given indication for AC (LV thrombus in pseudoaneurysm of left ventricle) would need a high threshold to stop AC - Continue Lipitor - Continue Eliquis    Bacteremia due to Gram-positive bacteria - Continue cefazolin - Plan for TEE on Monday   Pseudoaneurysm of left ventricle of heart - Continue Eliquis    Acute on chronic systolic CHF (congestive heart failure) (HCC) Appears euvolvmic - Continue Entresto, BB, spiro, Lasix   Essential hypertension BP controlled - Continue Entresto, Coreg, Lasix, metoprolol, spironolactone          Subjective: Summarizing from his PEG overnight, required dressing changes a few times, this morning looks hemostatic.  No fever, no focal weakness, no new complaints.  Other than a headache this morning.  No nursing concerns     Physical Exam: BP (!) 117/94 (BP Location: Left Arm)   Pulse 86   Temp 98 F (36.7 C) (Oral)   Resp 18   Ht 6\' 1"  (1.854 m)   Wt 97.8 kg   SpO2 98%   BMI 28.45 kg/m   Adult male, lying in bed, no acute distress RRR, no murmurs, no peripheral edema Respiratory rate normal, lungs clear without rales or wheezes Flaccid right side, left side has some discoordinated movement, heavily dysarthric Abdomen soft without tenderness palpation, minimal bleeding on his dressing    Data  Reviewed: Discussed with general surgery, appreciate cares   Family Communication: Girlfriend by phone    Disposition: Status is: Inpatient         Author: Alberteen Sam, MD 10/27/2022 11:15 AM  For on call review [email or web address].

## 2022-10-27 NOTE — Progress Notes (Signed)
Called to bs by nurse bc of concerns about ongoing bleeding around PEG site.   Family member at bs  G tube LUQ. No cellulitis. Some blood on drain sponge. Sponge removed. Some clot under bumper. Some oozing from actual insertion site. Don't see anything to silver nitrate. I cinched the PEG a little tighter to tamponade the small amount of oozing.    While pt has had some bleeding/oozing from around PEG - it is not large volume.   Need to cont antiplatelet given pseudoaneurysm of LV  Hopefully cinching PEG will help.  D/w TRH  Next best option could be suture thru skin/soft tissue around PEG but that could lead to skin issues.   Mary Sella. Andrey Campanile, MD, FACS General, Bariatric, & Minimally Invasive Surgery [facility],  [facility]

## 2022-10-27 NOTE — Progress Notes (Signed)
Trauma/Critical Care Follow Up Note  Subjective:    Overnight Issues:  Ask to re-eval site due to bleeding around g-tube.   RN noted bleeding around site overnight. Discussed with [facility], seems improved overnight. They would like to avoid holding anticoagulation if possible.  Tolerating TF's without leakage.  Objective:  Vital signs for last 24 hours: Temp:  [97.6 F (36.4 C)-98.7 F (37.1 C)] 98 F (36.7 C) (07/13 0800) Pulse Rate:  [79-93] 86 (07/13 0800) Resp:  [18-21] 18 (07/13 0346) BP: (92-136)/(63-106) 117/94 (07/13 0800) SpO2:  [64 %-99 %] 98 % (07/13 0800) Weight:  [97.8 kg] 97.8 kg (07/13 0500)   Intake/Output from previous day: 07/12 0701 - 07/13 0700 In: -  Out: 600 [Urine:600]  Intake/Output this shift: No intake/output data recorded.   Physical Exam:  Abd: soft, NT, ND, incisions c/d/I, g-tube in place. Bandage does have some blood on it. This was removed and site was inspected closely with RN. There is clot around tubing/under bumper but no signs of active bleeding. Dressing replaced. Asked RN to get abdominal binder to see if pressure will help stop the bleeding. Incisions otherwise cdi  Results for orders placed or performed during the hospital encounter of 10/02/22 (from the past 24 hour(s))  Glucose, capillary     Status: Abnormal   Collection Time: 10/26/22 11:16 AM  Result Value Ref Range   Glucose-Capillary 267 (H) 70 - 99 mg/dL  Glucose, capillary     Status: Abnormal   Collection Time: 10/26/22  3:39 PM  Result Value Ref Range   Glucose-Capillary 112 (H) 70 - 99 mg/dL   Comment 1 Notify RN   Glucose, capillary     Status: Abnormal   Collection Time: 10/26/22  9:26 PM  Result Value Ref Range   Glucose-Capillary 111 (H) 70 - 99 mg/dL   Comment 1 Notify RN    Comment 2 Document in Chart   Glucose, capillary     Status: Abnormal   Collection Time: 10/26/22 11:49 PM  Result Value Ref Range   Glucose-Capillary 178 (H) 70 - 99 mg/dL   Comment 1  Notify RN    Comment 2 Document in Chart   Glucose, capillary     Status: Abnormal   Collection Time: 10/27/22  3:48 AM  Result Value Ref Range   Glucose-Capillary 105 (H) 70 - 99 mg/dL   Comment 1 Notify RN    Comment 2 Document in Chart   Glucose, capillary     Status: Abnormal   Collection Time: 10/27/22  8:52 AM  Result Value Ref Range   Glucose-Capillary 156 (H) 70 - 99 mg/dL   Comment 1 Notify RN    Comment 2 Document in Chart     Assessment & Plan:  Present on Admission:  Stroke (cerebrum) (HCC)  Bacteremia due to Gram-positive bacteria  Dyslipidemia  Essential hypertension  ICD (implantable cardioverter-defibrillator) in place  Obesity (BMI 30.0-34.9)  Coronary artery disease  Acute on chronic systolic CHF (congestive heart failure) (HCC)  Pseudoaneurysm of left ventricle of heart  Acute respiratory failure with hypoxia (HCC)    LOS: 25 days   Additional comments:I reviewed the patient's new clinical lab test results.   and I reviewed the patients new imaging test results.    POD 3, s/p lap assisted g-tube placement, Dr. Bedelia Person 7/10 for CVA with dysphagia  - He has had some bleeding around his g-tube site.  This appears to have clotted off as it is not actively  bleeding upon my evaluation.  I have placed a new, clean, dry dressing. Will add abdominal binder to see if pressure helps prevent rebleeding.  - Hgb stable 712.  - Tolerating bolus feeds - Will touch base in am to see if any further bleeding.    FEN: TFs VTE: Eliquis ID: on ancef for bacteremia  Robert Halim, PA-C Trauma & General Surgery Please use [email or web address] to contact on call provider  10/27/2022  *Care during the described time interval was provided by me. I have reviewed this patient's available data, including medical history, events of note, physical examination and test results as part of my evaluation.

## 2022-10-27 NOTE — Plan of Care (Signed)
  Problem: Ischemic Stroke/TIA Tissue Perfusion: Goal: Complications of ischemic stroke/TIA will be minimized Outcome: Progressing   Problem: Self-Care: Goal: Ability to participate in self-care as condition permits will improve Outcome: Progressing Goal: Ability to communicate needs accurately will improve Outcome: Progressing   Problem: Nutrition: Goal: Risk of aspiration will decrease Outcome: Progressing Goal: Dietary intake will improve Outcome: Progressing

## 2022-10-27 NOTE — Progress Notes (Signed)
Patient off the unit for CT.

## 2022-10-27 NOTE — Progress Notes (Signed)
1730 Abdominal binder in place, some bleeding on drain sponge, informed Dr. Stann Mainland continue to monitor

## 2022-10-27 NOTE — Progress Notes (Signed)
Paged Dr Arlean Hopping to notify that radiology called and that the head CT is ready to view.

## 2022-10-27 NOTE — Progress Notes (Signed)
[facility] night cross cover note:   I was notified by RN that this patient with most recent head CT from 10/12/22 showing intracranial hemorrhage involving the bilateral cerebral and cerebellar hemispheres underwent repeat CT head this evening and that preliminary read shows persistence of intracranial hemorrhage. Per my discussions with RN, patient has no new acute focal neuro deficits and no report of acute mental status changes.     Newton Pigg, DO Hospitalist

## 2022-10-27 NOTE — Plan of Care (Signed)
  Problem: Coping: Goal: Will identify appropriate support needs Outcome: Progressing   Problem: Health Behavior/Discharge Planning: Goal: Goals will be collaboratively established with patient/family Outcome: Progressing   Problem: Nutrition: Goal: Risk of aspiration will decrease Outcome: Progressing   Problem: Education: Goal: Knowledge of General Education information will improve Description: Including pain rating scale, medication(s)/side effects and non-pharmacologic comfort measures Outcome: Progressing   Problem: Health Behavior/Discharge Planning: Goal: Ability to manage health-related needs will improve Outcome: Progressing   Problem: Clinical Measurements: Goal: Respiratory complications will improve Outcome: Progressing

## 2022-10-27 NOTE — Progress Notes (Signed)
Pt continues to bleed intermittently from G-tube site. Dressing changed x 3 this shift.

## 2022-10-28 DIAGNOSIS — I63413 Cerebral infarction due to embolism of bilateral middle cerebral arteries: Secondary | ICD-10-CM | POA: Diagnosis not present

## 2022-10-28 DIAGNOSIS — Z66 Do not resuscitate: Secondary | ICD-10-CM | POA: Diagnosis not present

## 2022-10-28 DIAGNOSIS — R1312 Dysphagia, oropharyngeal phase: Secondary | ICD-10-CM | POA: Diagnosis not present

## 2022-10-28 DIAGNOSIS — I639 Cerebral infarction, unspecified: Secondary | ICD-10-CM | POA: Diagnosis not present

## 2022-10-28 DIAGNOSIS — D696 Thrombocytopenia, unspecified: Secondary | ICD-10-CM

## 2022-10-28 DIAGNOSIS — G936 Cerebral edema: Secondary | ICD-10-CM

## 2022-10-28 DIAGNOSIS — E669 Obesity, unspecified: Secondary | ICD-10-CM

## 2022-10-28 DIAGNOSIS — J690 Pneumonitis due to inhalation of food and vomit: Secondary | ICD-10-CM

## 2022-10-28 LAB — GLUCOSE, CAPILLARY
Glucose-Capillary: 68 mg/dL — ABNORMAL LOW (ref 70–99)
Glucose-Capillary: 109 mg/dL — ABNORMAL HIGH (ref 70–99)
Glucose-Capillary: 87 mg/dL (ref 70–99)
Glucose-Capillary: 168 mg/dL — ABNORMAL HIGH (ref 70–99)
Glucose-Capillary: 153 mg/dL — ABNORMAL HIGH (ref 70–99)
Glucose-Capillary: 90 mg/dL (ref 70–99)
Glucose-Capillary: 205 mg/dL — ABNORMAL HIGH (ref 70–99)
Glucose-Capillary: 153 mg/dL — ABNORMAL HIGH (ref 70–99)

## 2022-10-28 LAB — BASIC METABOLIC PANEL
Anion gap: 9 (ref 5–15)
BUN: 48 mg/dL — ABNORMAL HIGH (ref 8–23)
CO2: 25 mmol/L (ref 22–32)
Calcium: 8.9 mg/dL (ref 8.9–10.3)
Chloride: 103 mmol/L (ref 98–111)
Creatinine, Ser: 1.08 mg/dL (ref 0.61–1.24)
GFR, Estimated: >60 mL/min (ref >60)
Glucose, Bld: 121 mg/dL — ABNORMAL HIGH (ref 70–99)
Potassium: 4 mmol/L (ref 3.5–5.1)
Sodium: 137 mmol/L (ref 135–145)

## 2022-10-28 LAB — CBC
HCT: 39.2 % (ref 39.0–52.0)
Hemoglobin: 13.1 g/dL (ref 13.0–17.0)
MCH: 30 pg (ref 26.0–34.0)
MCHC: 33.4 g/dL (ref 30.0–36.0)
MCV: 89.7 fL (ref 80.0–100.0)
Platelets: 141 10*3/uL — ABNORMAL LOW (ref 150–400)
RBC: 4.37 MIL/uL (ref 4.22–5.81)
RDW: 12.8 % (ref 11.5–15.5)
WBC: 9.7 10*3/uL (ref 4.0–10.5)
nRBC: 0 % (ref 0.0–0.2)

## 2022-10-28 MED ORDER — OXYCODONE HCL 5 MG PO TABS
2.5000 mg | ORAL_TABLET | ORAL | Status: DC | PRN
  Administered 2022-10-28: 2.5 mg
  Filled 2022-10-28: qty 1

## 2022-10-28 MED ORDER — CARMEX CLASSIC LIP BALM EX OINT
TOPICAL_OINTMENT | CUTANEOUS | Status: DC | PRN
  Filled 2022-10-28: qty 10

## 2022-10-28 MED ORDER — OXYCODONE-ACETAMINOPHEN 5-325 MG PO TABS
1.0000 | ORAL_TABLET | ORAL | Status: DC | PRN
  Administered 2022-10-28: 1
  Filled 2022-10-28: qty 1

## 2022-10-28 MED ORDER — SODIUM CHLORIDE 0.9 % IV SOLN: INTRAVENOUS | Status: DC

## 2022-10-28 MED ORDER — OXYCODONE-ACETAMINOPHEN 7.5-325 MG PO TABS: 1.0000 | ORAL_TABLET | ORAL | Status: DC | PRN

## 2022-10-28 MED ORDER — SENNOSIDES-DOCUSATE SODIUM 8.6-50 MG PO TABS: 1.0000 | ORAL_TABLET | Freq: Every evening | ORAL | Status: DC | PRN

## 2022-10-28 NOTE — Progress Notes (Signed)
Trauma/Critical Care Follow Up Note  Subjective:    Overnight Issues:  Seen with RN. No further bleeding after binder + synching down flange on PEG.  Tolerating TF's without leakage.  Objective:  Vital signs for last 24 hours: Temp:  [98.3 F (36.8 C)-98.6 F (37 C)] 98.3 F (36.8 C) (07/14 0747) Pulse Rate:  [79-89] 87 (07/14 0747) Resp:  [18-20] 20 (07/14 0336) BP: (92-110)/(60-84) 94/73 (07/14 0747) SpO2:  [93 %-98 %] 98 % (07/14 0747)   Intake/Output from previous day: 07/13 0701 - 07/14 0700 In: 2015 [NG/GT:1415; IV Piggyback:500] Out: 800 [Urine:800]  Intake/Output this shift: No intake/output data recorded.   Physical Exam:  Abd: Soft, NT, ND, incisions c/d/I, g-tube in place without signs of bleeding or surrounding skin changes.   Results for orders placed or performed during the hospital encounter of 10/02/22 (from the past 24 hour(s))  Glucose, capillary     Status: Abnormal   Collection Time: 10/27/22  1:25 PM  Result Value Ref Range   Glucose-Capillary 212 (H) 70 - 99 mg/dL   Comment 1 Notify RN    Comment 2 Document in Chart   Glucose, capillary     Status: None   Collection Time: 10/27/22  3:57 PM  Result Value Ref Range   Glucose-Capillary 85 70 - 99 mg/dL   Comment 1 T J   Glucose, capillary     Status: Abnormal   Collection Time: 10/27/22  7:44 PM  Result Value Ref Range   Glucose-Capillary 204 (H) 70 - 99 mg/dL   Comment 1 Notify RN    Comment 2 Document in Chart   Glucose, capillary     Status: Abnormal   Collection Time: 10/27/22 11:38 PM  Result Value Ref Range   Glucose-Capillary 213 (H) 70 - 99 mg/dL   Comment 1 Notify RN    Comment 2 Document in Chart   Glucose, capillary     Status: Abnormal   Collection Time: 10/28/22  3:23 AM  Result Value Ref Range   Glucose-Capillary 68 (L) 70 - 99 mg/dL  Glucose, capillary     Status: Abnormal   Collection Time: 10/28/22  3:37 AM  Result Value Ref Range   Glucose-Capillary 109 (H) 70 - 99  mg/dL  CBC     Status: Abnormal   Collection Time: 10/28/22  3:54 AM  Result Value Ref Range   WBC 9.7 4.0 - 10.5 K/uL   RBC 4.37 4.22 - 5.81 MIL/uL   Hemoglobin 13.1 13.0 - 17.0 g/dL   HCT 69.6 29.5 - 28.4 %   MCV 89.7 80.0 - 100.0 fL   MCH 30.0 26.0 - 34.0 pg   MCHC 33.4 30.0 - 36.0 g/dL   RDW 13.2 44.0 - 10.2 %   Platelets 141 (L) 150 - 400 K/uL   nRBC 0.0 0.0 - 0.2 %  Basic metabolic panel     Status: Abnormal   Collection Time: 10/28/22  3:54 AM  Result Value Ref Range   Sodium 137 135 - 145 mmol/L   Potassium 4.0 3.5 - 5.1 mmol/L   Chloride 103 98 - 111 mmol/L   CO2 25 22 - 32 mmol/L   Glucose, Bld 121 (H) 70 - 99 mg/dL   BUN 48 (H) 8 - 23 mg/dL   Creatinine, Ser 7.25 0.61 - 1.24 mg/dL   Calcium 8.9 8.9 - 36.6 mg/dL   GFR, Estimated >44 >03 mL/min   Anion gap 9 5 - 15  Glucose, capillary  Status: None   Collection Time: 10/28/22  5:55 AM  Result Value Ref Range   Glucose-Capillary 87 70 - 99 mg/dL  Glucose, capillary     Status: Abnormal   Collection Time: 10/28/22  7:46 AM  Result Value Ref Range   Glucose-Capillary 168 (H) 70 - 99 mg/dL   Comment 1 Notify RN    Comment 2 Document in Chart     Assessment & Plan:  Present on Admission:  Stroke (cerebrum) (HCC)  Bacteremia due to Gram-positive bacteria  Dyslipidemia  Essential hypertension  ICD (implantable cardioverter-defibrillator) in place  Obesity (BMI 30.0-34.9)  Coronary artery disease  Acute on chronic systolic CHF (congestive heart failure) (HCC)  Pseudoaneurysm of left ventricle of heart  Acute respiratory failure with hypoxia (HCC)    LOS: 26 days    POD 4, s/p lap assisted g-tube placement, Dr. Bedelia Person 7/10 for CVA with dysphagia  - Bleeding has now resolved after abdominal binder and synching down flange on PEG. Likely will need to loosen flange in the next 1-2 days.    FEN: TFs VTE: Eliquis ID: on ancef for bacteremia  Jacinto Halim, PA-C Trauma & General Surgery Please use  [email or web address] to contact on call provider  10/28/2022  *Care during the described time interval was provided by me. I have reviewed this patient's available data, including medical history, events of note, physical examination and test results as part of my evaluation.

## 2022-10-28 NOTE — Progress Notes (Signed)
STROKE TEAM PROGRESS NOTE   INTERVAL HISTORY Patient seen and evaluated this morning, he is alone. Primary team contacted stroke team due to head CT showing new hemorrhage. Per primary team, patient was complaining of new headaches, no new weakness. Repeat CT head showed new acute hemorrhage with increase size and density of several of the previously noted areas of hemorrhage. There is also acute hemorrhage layering the bilateral occipital horns.  When asking about his headaches and any new weakness, he is trying to speak but makes mostly guttural sounds which are difficult to understand.   Vitals:   10/27/22 2336 10/28/22 0336 10/28/22 0747 10/28/22 1550  BP: 108/71 92/79 94/73  103/75  Pulse: 89 85 87 79  Resp: 18 20  16   Temp: 98.4 F (36.9 C) 98.4 F (36.9 C) 98.3 F (36.8 C) 98.6 F (37 C)  TempSrc:  Oral Oral   SpO2: 98% 98% 98% 98%  Weight:      Height:       CBC:  Recent Labs  Lab 10/26/22 0650 10/28/22 0354  WBC 8.7 9.7  HGB 14.0 13.1  HCT 41.8 39.2  MCV 88.0 89.7  PLT 159 141*   Basic Metabolic Panel:  Recent Labs  Lab 10/26/22 0650 10/28/22 0354  NA 135 137  K 4.1 4.0  CL 100 103  CO2 24 25  GLUCOSE 218* 121*  BUN 54* 48*  CREATININE 1.12 1.08  CALCIUM 9.1 8.9    IMAGING past 24 hours CT HEAD WO CONTRAST ( )  Result Date: 10/27/2022 CLINICAL DATA:  Headache, increasing frequency or severity EXAM: CT HEAD WITHOUT CONTRAST TECHNIQUE: Contiguous axial images were obtained from the base of the skull through the vertex without intravenous contrast. RADIATION DOSE REDUCTION: This exam was performed according to the departmental dose-optimization program which includes automated exposure control, adjustment of the mA and/or kV according to patient size and/or use of iterative reconstruction technique. COMPARISON:  10/12/2022 CT head FINDINGS: Brain: Redemonstrated multifocal ill-defined hyperdense hemorrhage, with the largest area posterior left frontal/anterior  left parietal lobe (series 3, image 22), which has diminished in size and density. Increased hypodensity surrounding this, likely edema Increased size of a previously noted hemorrhage involving the left posterior lentiform nucleus and periventricular white matter, now measuring approximately 18 x 16 x 20 mm (series 6, image 39 and series 3, image 18), previously 16 x 10 x 10 mm, with an additional 5 mm focus hyperdense hemorrhage superior and posterior to this (series 3, image 19). Additional acute appearing hemorrhage in the right occipital lobe (series 3, image 15),, left occipital lobe (series 3, image 18), left cerebellum (series 3, image 9), which correlate with sites of less acute appearing hemorrhage on the prior exam. Hyperdense hemorrhage is also noted layering in the bilateral occipital horns (series 3, image 16); on the prior exam the hemorrhage in these locations with less dense. Slightly increased size of the bilateral lateral ventricles and third ventricle. Previously noted blood products in the right cerebellum and right frontoparietal region decreased in conspicuity. Redemonstrated areas of encephalomalacia and edema. No significant midline shift. The basilar cisterns remain patent. Vascular: No hyperdense vessel. Skull: Negative for fracture or focal lesion. Sinuses/Orbits: No acute finding. Status post bilateral lens replacements. Other: The mastoid air cells are well aerated. IMPRESSION: 1. Findings concerning for new acute hemorrhage, with increased size and density of several of the previously noted areas of hemorrhage, particularly involving the posterior left lentiform nucleus/periventricular white matter, bilateral occipital lobes, and left cerebellum.  2. Acute hemorrhage layering in the bilateral occipital horns, with slightly increased size of the bilateral lateral ventricles and third ventricle, concerning for hydrocephalus. 3. Previously noted blood products in the right cerebellum and  right frontoparietal region of decreased in conspicuity, consistent with aging hemorrhage. These results will be called to the ordering clinician or representative by the Radiologist Assistant, and communication documented in the PACS or [employer]. Electronically Signed   By: Wiliam Ke M.D.   On: 10/27/2022 20:18     Physical Exam  Constitutional: Appears well-developed and well-nourished.  Middle-aged African-American male Cardiovascular: Normal rate and regular rhythm.  Respiratory: Regular, unlabored respirations on supplemental O2 Skin: WDI  Neuro: Mental Status: Patient is awake, alert, speech is severely dysarthric with expressive aphasia. .Following commands briskly on the left side Cranial Nerves: II: Pupils are equal, round, and reactive to light.   III,IV, VI: EOMI without ptosis or diplopia.  VII: Mild right facial droop  VIII: Hearing is intact to voice XII: tongue is midline without atrophy or fasciculations.  Motor: Tone is normal. Bulk is normal.  Moves left upper and lower extremities to command, will does not move right upper extremity but moves right lower extremity to command     ASSESSMENT/PLAN [name] is a 62 y.o. male with history of CAD s/p PCI w/ stent to LCS and LAD, s/p 1v CABG, multiple CVAs (R cerebellar 04/2022),  systolic HF s/p ICD, HLD, HTN presenting with slurred speech and L sided weakness now s/p TNK and IR thrombectomy of L MCA (M3 segment) c/b  hemorrhage.   TIA - Right brain stroke aborted s/p TNK and later that day second stroke with left M2 occlusion s/p IR Stroke - Left MCA and right cerebellar infarcts left M3 occlusion s/p IR with TICIs, complicated by L frontoparietal ICH and SAH, etiology likely cardioembolic source  CT 6/18 no acute finding, but the bilateral frontal, parietal, occipital and cerebellum infarcts CTA head and neck unremarkable CTA head and neck repeat new short segment occlusion of distal left M2 S/p  IR with left M3 TICIs reperfusion  post IR CT Hyperdensity within the left sylvian fissure may represent small subarachnoid hemorrhage versus contrast. CT Head: Progressed hemorrhage in L frontoparietal region w/ 3cm hematoma and regional SAH MRI  progressive L fronto parietal hematoma (~7cm), regional SAH, increased mass effect.  Likely underlying infarct. Sm R cerebellar infarct, acute. Numerous chornic cerebral and cerebellar infarcts Repeat CT 6/19 increase in size of the left frontal/parietal hematoma. New extension into the left lateral ventricle. CT repeat 6/22 x 2 - New component of intraparenchymal hemorrhage in the posterior right hemisphere that measures approximately 2.5 x 1.4 cm. (On my review, more like right brain redistribution instead of new hemorrhage)  Repeat CT 6/23: Unchanged appearance of bilateral mixed intraparenchymal and subarachnoid hemorrhage. Trace rightward midline shift is unchanged. No hydrocephalus CT repeat 6/25 stable hemorrhage, unchanged CT Head 6/28-  stable hematoma. SAH and IVH have regressed since 10/03/2022.  CT Head 7/14 with new hemorrhage in the previously noted areas of hemorrhage, bilateral occipital horns with slightly increase size of the ventricles concerning of hydrocephalus  Echo: Large inferior pseudoaneurysm with a depth of 3 cm and a maximum width of roughly 5 cm the wall of the pseudoaneurysm is made above the layer of thrombus that is roughly 1.5 cm thick. EF 30 to 35%, severe hypokinesis of left ventricle. No afib with ICD interrogation LE venous doppler neg for DVT TCD bubble  study showed moderate sized PFO LDL 63 HgbA1c 5.8 UDS neg VTE prophylaxis - SQH  aspirin 81 mg daily and clopidogrel 75 mg daily prior to admission, was on Eliquis but due to new area will have to hold Eliquis for a couple days, or until repeat CT is stable Therapy recommendations:  SNF Disposition:  pending  CHF s/p ICD Systolic HF, s/p ICD 2/2 ICM.  Echo EF  30-35%  Trop 45 -> 43 -> 39 -> 35 CK 3714, CKMB 3.5 CXR 6/25 central vascular congestion and developing interstitial edema.  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  Cardiology on board, appreciate assistance  CTA of the heart intact chronic patch repair to inferior and inferior septal pseudoaneurysm, large burden of thrombus on top of patch repair, severe biventricular failure with D-shaped septum, occluded SVG to OM Eliquis held due to new hemorrhage  Ultimately patient will need to repeat undergo repeat pseudoaneurysm repair if he becomes healthy enough to tolerate surgery  Hyperlipidemia Home meds:  atorvastatin 80 LDL 63, goal < 70 Lipitor 80 resumed    Continue statin at discharge  Tobacco abuse Current smoker Smoking cessation counseling will be provided Nicotine patch provided  Fever ? Aspiration pneumonia Tmax 101.6 -> 99.8-> afebrile->100.8 -> 102-> 101.7-> 101.1 -> 101-> 100.3-> 99.4 WBC 7.6->9.4->10.2->9.8->10.2-> 9.2 UA negative CXR central vascular congestion and developing interstitial edema.  CXR 6/26 similar appearance of atelectasis at both lung bases Procalc negative.  Blood culture 2/4 bottles staph epidermidis, ID consulted, appreciate recommendations Blood culture repeat pending -no growth x 3 days NT suction PRN Ceftriaxone -> ancef ID consulted EP on  board - not candidate for ICD extraction at this time Cardiology felt pt not candidate for TEE now given respiratory distress, however they are willing to do it if he is intubated  Dysphagia  Vomiting on zofran PRN, improved Pt ate well 6/24, nocturnal TF d/c'ed -> fever with possible aspiration -> NPO and TF restarted on 6/25 6/27 having difficulty coordinating swallowing and respirations  SLP will need to re evaluate  PFO Known PFO in the past TCD bubble study showed a moderate-sized PFO LE venous Doppler negative for DVT  Not an emergent issue at this time  Other stroke risk factors Obesity with BMI 32.23 CAD  Other Active Problems Thrombocytopenia, platelet 110->89-> 172-> 190-> stable CK 436 -> 3714 -> 610-> 490  Hospital day # 26  I, the attending neurologist, have personally obtained a history, examined the patient, evaluated laboratory data, individually viewed imaging studies and agree with radiology interpretations. I obtained additional history from pt's  at bedside. He does a LV thrombus and was on Eliquis but due to new hemorrhage on CT head, we will have to hold the Eliquis for 24 hrs or 48 hrs or until hemorrhage stabilize. While we hold the Eliquis, he is at risk of another stroke, but the risk of worsening brain bleed and death from  brain hemorrhage, brain edema or brain herniation is also greater if we continue with anticoagulation. Stroke team will continue to follow.    Windell Norfolk, MD  Stroke Neurology 10/28/2022 6:56 PM

## 2022-10-28 NOTE — Anesthesia Preprocedure Evaluation (Addendum)
Anesthesia Evaluation  Patient identified by MRN, date of birth, ID band  Reviewed: Allergy & Precautions, NPO status , Patient's Chart, lab work & pertinent test results  History of Anesthesia Complications Negative for: history of anesthetic complications  Airway Mallampati: II  TM Distance: >3 FB Neck ROM: Full    Dental  (+) Edentulous Upper, Edentulous Lower   Pulmonary pneumonia, Current Smoker and Patient abstained from smoking.   Pulmonary exam normal breath sounds clear to auscultation       Cardiovascular hypertension, Pt. on medications and Pt. on home beta blockers (-) angina + CAD, + Cardiac Stents, + CABG and +CHF  + Cardiac Defibrillator  Rhythm:Regular Rate:Normal  Echo 6//2023  1. Cannot comment on EF due to poor acoustical windows. Left ventricular endocardial border not optimally defined to evaluate regional wall motion. Images suggestive of focal wall motion abnormalities in the inferior and inferoseptal walls but cannot comment with certainty. Left ventricular diastolic parameters are indeterminate. Elevated left ventricular end-diastolic pressure.   2. Right ventricular systolic function was not well visualized. The right ventricular size is not well visualized. Tricuspid regurgitation signal is inadequate for assessing PA pressure.   3. The mitral valve is normal in structure. Trivial mitral valve regurgitation. No evidence of mitral stenosis.   4. The aortic valve is normal in structure. Aortic valve regurgitation is not visualized. No aortic stenosis is present.   5. Definity contrast was not used due to presence of known PFO    Echo 10/07/2022  1. There is a large inferior pseudoaneurysm with a depth of 3 cm and a maximum width of roughly 5 cm. The "neck" at the level of the inferior  wall measures 2.3 cm. The wall of the pseudoaneurysm is made up of a layer  of thrombus that is roughly 1.5 cm thick. No mobile  thrombus is seen.. Left ventricular ejection fraction, by  estimation, is 30 to 35%. The left ventricle has moderately decreased function. The left ventricle demonstrates regional wall motion  abnormalities (see scoring diagram/findings for description). The left ventricular internal cavity size was mildly dilated. There is akinesis of the left ventricular, basal-mid inferior wall and inferolateral wall. There is severe hypokinesis of the left  ventricular, basal-mid inferoseptal wall. There is severe hypokinesis of the left ventricular, basal-mid lateral wall. There is peudoaneurysm of the left ventricular, basal inferior wall.   Conclusion(s)/Recommendation(s): Findings are highly concerning for redevelopment of pseudoaneurysm of the inferior wall that was repaired in 2014. There appears to be dehiscence of the patch used to correct abnormality, with development of new pseudoaneurysm. No protruding or mobile thrombi are seen in the pseudoaneurysm, but a thick layer of thrombus makes up the external wall of the pseudoaneurysm. Redcommend CT angiography or MR angiography of the heart for precise definition.    Echo 04/2022  1. Left ventricular ejection fraction, by estimation, is 35 to 40%. The left ventricle has moderately decreased function. The left ventricle demonstrates regional wall motion abnormalities (see scoring diagram/findings for description). Inferior/inferolateral akinesis. There is mild left ventricular hypertrophy. Left ventricular diastolic parameters are consistent with Grade II diastolic dysfunction (pseudonormalization). Elevated left atrial pressure.   2. Right ventricular systolic function was not well visualized. The right ventricular size is not well visualized.   3. The mitral valve is normal in structure. No evidence of mitral valve regurgitation. No evidence of mitral stenosis.   4. The aortic valve is tricuspid. Aortic valve regurgitation is not visualized. No aortic stenosis  is  present.   5. Agitated saline contrast bubble study was positive with shunting observed within 3-6 cardiac cycles suggestive of interatrial shunt. Poor quality images on bubble study, but significant shunting seen      Neuro/Psych TIACVA (L arm weakness, R sided weakness, aphasia), Residual Symptoms    GI/Hepatic Neg liver ROS,,,dysphagia   Endo/Other  negative endocrine ROS    Renal/GU negative Renal ROS     Musculoskeletal   Abdominal   Peds  Hematology negative hematology ROS (+)   Anesthesia Other Findings   Reproductive/Obstetrics                             Anesthesia Physical Anesthesia Plan  ASA: 4  Anesthesia Plan: MAC   Post-op Pain Management: Minimal or no pain anticipated   Induction: Intravenous  PONV Risk Score and Plan: 1 and Propofol infusion and Treatment may vary due to age or medical condition  Airway Management Planned: Natural Airway  Additional Equipment:   Intra-op Plan:   Post-operative Plan:   Informed Consent: I have reviewed the patients History and Physical, chart, labs and discussed the procedure including the risks, benefits and alternatives for the proposed anesthesia with the patient or authorized representative who has indicated his/her understanding and acceptance.   Patient has DNR.  Suspend DNR and Discussed DNR with power of attorney.   Dental advisory given and Consent reviewed with POA  Plan Discussed with: CRNA  Anesthesia Plan Comments:         Anesthesia Quick Evaluation

## 2022-10-28 NOTE — Progress Notes (Signed)
Progress Note   Patient: [name]:[id number] DOB: 1960/09/10 DOA: 10/02/2022     26 DOS: the patient was seen and examined on 10/28/2022       Brief hospital course: [name] is a 62 y.o. 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     Significant events: 6/18: Admitted for stroke, received TNK; developed right sided deficits overnight after TNK, underwent NIR thrombectomy 6/19: Post thrombectomy CT showed progression of left frontal IPH and SAH 6/23: Repeat echo shows dehiscence of prior pseudoaneurysm patch with adherent LV thrombus; Cardiology consulted 6/26: ID consulted for MSSE bacteremia 6/28: EP consulted, recommended not to take out ICD 7/2: Transferred [facility] 7/10: Placed PEG tube 7/11: Transitioned to basal bolus feedings and switched to Eliquis 7/13: Increased HA, repeat CT shows progression of ICHs; Eliquis held    Significant studies: 6/18 CTA head and neck: no LVO 6/18 repeat CTA head and neck: new distal left MCA M2 occlusion 6/19 Echo: poor quality images, nondiagnostic 6/19 TCD: positive study, indicating R>L shunt 6/19 LE doppler: no DVT 6/19 CT head: left frontal parietal IPH and adjacent [name] 6/22 CT head: size now 8x4x4cm, with new 2cm component 6/23 complete echo: now shows recurrence of pseudoaneurysm, dehiscence of patch with adherent thrombus 6/25 coronary CTA: confirms above 6/28 CT head: stable large left frontoparietal hematoma 7/13: CT head shows worsening ICH 7/15 TEE: pending   Significant microbiology data: 6/18 MRSA nares: Negative 6/25 Blood cultures: MSSE in 2/2 6/27 blood cultures: NG in 2/2    Procedures: 6/18: TNK at 1246 6/19: Thrombectomy by [name] 6/19: Cortrak placed 7/10: PEG placed    Consults: Cardiology Infectious  disease Electrophysiology Critical Care Palliative Care General Surgery      Assessment and Plan: * Stroke (cerebrum) (HCC) Intraparenchymal hematoma 7/13 with worsening headache, increased generalized weakness.  Repeat CT head showed new acute IPH.  Discussed with Neurology [name] and [name], will need to hold anticoagulation again.  Given known pseudoaneurysm, no Andexxa at this time. - Continue Lipitor - Hold Eliquis - Plan for repeat CT head    Bacteremia due to Gram-positive bacteria - Continue cefazolin - Plan for TEE on Monday   Pseudoaneurysm of left ventricle of heart Situation admittedly difficult.  In light of progression of ICH, need to hold Eliquis, understanding risk of recurrent embolism  CHF  Essential hypertension Dyslipidemia Coronary artery disease - Continue Entresto, Coreg, Lasix, metoprolol, spironolactone - Continue Lipitor  Dysphagia Bleeding from the PEG site is improved, appreciate general surgery attention - Continue Tube feeds       Subjective: Patient still having headache today.  No fever, no vomiting, no seizure, no decrease in mentation.     Physical Exam: BP 94/73 (BP Location: Left Arm)   Pulse 87   Temp 98.3 F (36.8 C) (Oral)   Resp 20   Ht 6\' 1"  (1.854 m)   Wt 97.8 kg   SpO2 98%   BMI 28.45 kg/m   Adult male, lying in bed, appears weak and tired. RRR, no murmurs, no peripheral edema Respiratory rate normal, lungs clear without rales or wheezes Abdomen soft no tenderness palpation Severely dysarthric, 1/5 weakness on the right, 4/5 weakness of the left, facial droop noted.  Attempts to respond to questions, appears alert and oriented, nods appropriately or  shakes his head appropriately.    Data Reviewed: Discussed with neurology Basic metabolic panel unremarkable CBC normal, platelets 141  Family Communication: Girlfriend [name]    Disposition: Status is:  Inpatient         Author: Alberteen Sam, MD 10/28/2022 12:58 PM  For on call review [email or web address].

## 2022-10-28 NOTE — Progress Notes (Signed)
Patient ID: [name], male   DOB: August 01, 1960, 62 y.o.   MRN: [id number]    Progress Note from the Palliative Medicine Team at [facility]   Patient Name: [name]        Date: 10/28/2022 DOB: [birth year]  Age: 62 y.o. MRN#: [id number] Attending Physician: Alberteen Sam, * Primary Care Physician: [name], MD Admit Date: 10/02/2022   Extensive chart review has been completed prior to meeting with patient/family  including labs, vital signs, imaging, progress/consult notes, orders, medications and available advance directive documents.    62 y/o male who presented 6/19 with L weakness and numbness. Pt received TNK then developed new symptoms of aphasia and R weakness. He underwent mechanical thrombectomy L M3 segment MCA 6/19. Pt noted to have small hemorrhage and follow up CT showed progression of hemorrhage 3cm then 7cm and small acute R cerebellar infarct. PMH: CAD s/p PCI w/stent and 1v CABG, multiple CGAs, systolic HF s/p ICD, HLD, HTN.   10-16-22 Initial PMT consult  S/p PEG  placement  10-27-22 repeat head CT, showed progression of ICH  This NP assessed patient at the bedside as a follow up palliative medicine needs and emotional support, no family at bedside.    Patient is alert, he attempts his speech, it is only guttural sounds.  He is following simple commands.  I left a phone message with patient's son [name], await callback.   I spoke by phone with patient's SO [name].   [name] verbalizes concern that she feels that since patient had PEG placed he has declined.  She worried that he was not getting any nutritional support.  Education offered on the difference between a continuous tube feeds  versus bolus feeding.  Assured her that he is getting his full nutritional and hydration needs met by the bolus feeds.        She verbalizes understanding.  Again I offered education on seriousness of patient's current medical situation and  is high risk of decompensation, even with full medical support.  I have verbalized my concern for [name] and the likely projected trajectory at the skilled nursing facility for continued rehabilitation;   increased risk of infections; aspiration , upper respiratory, UTI, skin.   [name] verbalizes a clear understanding of patient's current medical situation.  She understands and to voices her concerns  Plan is for family meeting tomorrow morning at 930   Plan of Care: -DNR/DNI- documented today -continue with all offered and available medical interventions to prolong life -when medically stable SNF for ongoing rehab/recommend outpatient palliative care services at discharge  Questions and concerns addressed  Time: 50   minutes  [name] NP  Palliative Medicine Team Team Phone # [phone] Pager [phone]

## 2022-10-28 NOTE — TOC Progression Note (Signed)
Transition of Care [facility]) - Progression Note    Patient Details  Name: [name] MRN: [id number] Date of Birth: 1961-02-06  [facility]) CM/SW Contact  Dellie Burns [place], [place] Phone Number: 10/28/2022, 9:01 AM  Clinical Narrative:  left voicemail for pt's son Sol [phone] requesting return call re SNF choice. Will provide updates as available.   Dellie Burns, MSW, LCSW [phone] (coverage)      Expected Discharge Plan: Skilled Nursing Facility Barriers to Discharge: Continued Medical Work up, English as a second language teacher, SNF Pending bed offer  Expected Discharge Plan and Services In-house Referral: Clinical Social Work     Living arrangements for the past 2 months: Apartment                                       Social Determinants of Health (SDOH) Interventions SDOH Screenings   Food Insecurity: Patient Unable To Answer (10/11/2022)  Housing: Patient Unable To Answer (10/11/2022)  Transportation Needs: Patient Unable To Answer (10/11/2022)  Utilities: Patient Unable To Answer (10/11/2022)  Depression (PHQ2-9): Low Risk  (05/08/2022)  Social Connections: Unknown (08/28/2021)   Received from [facility]  Tobacco Use: High Risk (10/24/2022)    Readmission Risk Interventions     No data to display

## 2022-10-29 ENCOUNTER — Inpatient Hospital Stay (HOSPITAL_COMMUNITY): Payer: Medicare PPO | Admitting: Anesthesiology

## 2022-10-29 ENCOUNTER — Encounter (HOSPITAL_COMMUNITY): Payer: Self-pay | Admitting: Neurology

## 2022-10-29 ENCOUNTER — Inpatient Hospital Stay (HOSPITAL_COMMUNITY)
Admit: 2022-10-29 | Discharge: 2022-10-29 | Disposition: A | Discharge status: Home / Self Care | Payer: Medicare PPO | Attending: Cardiology | Admitting: Cardiology

## 2022-10-29 ENCOUNTER — Encounter (HOSPITAL_COMMUNITY)
Admission: EM | Disposition: A | Discharge status: Skilled Nursing Facility | Payer: Self-pay | Source: Home / Self Care | Attending: Neurology

## 2022-10-29 DIAGNOSIS — J690 Pneumonitis due to inhalation of food and vomit: Secondary | ICD-10-CM | POA: Diagnosis not present

## 2022-10-29 DIAGNOSIS — I517 Cardiomegaly: Secondary | ICD-10-CM | POA: Diagnosis not present

## 2022-10-29 DIAGNOSIS — B957 Other staphylococcus as the cause of diseases classified elsewhere: Secondary | ICD-10-CM | POA: Diagnosis not present

## 2022-10-29 DIAGNOSIS — I639 Cerebral infarction, unspecified: Secondary | ICD-10-CM

## 2022-10-29 DIAGNOSIS — I110 Hypertensive heart disease with heart failure: Secondary | ICD-10-CM

## 2022-10-29 DIAGNOSIS — I253 Aneurysm of heart: Secondary | ICD-10-CM

## 2022-10-29 DIAGNOSIS — I700 Atherosclerosis of aorta: Secondary | ICD-10-CM | POA: Diagnosis not present

## 2022-10-29 DIAGNOSIS — R7881 Bacteremia: Secondary | ICD-10-CM | POA: Diagnosis not present

## 2022-10-29 DIAGNOSIS — F17210 Nicotine dependence, cigarettes, uncomplicated: Secondary | ICD-10-CM

## 2022-10-29 DIAGNOSIS — I5023 Acute on chronic systolic (congestive) heart failure: Secondary | ICD-10-CM | POA: Diagnosis not present

## 2022-10-29 DIAGNOSIS — I63413 Cerebral infarction due to embolism of bilateral middle cerebral arteries: Secondary | ICD-10-CM | POA: Diagnosis not present

## 2022-10-29 DIAGNOSIS — I6300 Cerebral infarction due to thrombosis of unspecified precerebral artery: Secondary | ICD-10-CM | POA: Diagnosis not present

## 2022-10-29 DIAGNOSIS — R1312 Dysphagia, oropharyngeal phase: Secondary | ICD-10-CM | POA: Diagnosis not present

## 2022-10-29 HISTORY — PX: TEE WITHOUT CARDIOVERSION: SHX5443

## 2022-10-29 LAB — GLUCOSE, CAPILLARY
Glucose-Capillary: 108 mg/dL — ABNORMAL HIGH (ref 70–99)
Glucose-Capillary: 143 mg/dL — ABNORMAL HIGH (ref 70–99)
Glucose-Capillary: 157 mg/dL — ABNORMAL HIGH (ref 70–99)
Glucose-Capillary: 159 mg/dL — ABNORMAL HIGH (ref 70–99)
Glucose-Capillary: 89 mg/dL (ref 70–99)
Glucose-Capillary: 91 mg/dL (ref 70–99)

## 2022-10-29 LAB — ECHO TEE

## 2022-10-29 SURGERY — ECHOCARDIOGRAM, TRANSESOPHAGEAL
Anesthesia: Monitor Anesthesia Care

## 2022-10-29 MED ORDER — FREE WATER
60.0000 mL | Freq: Every day | Status: DC
  Administered 2022-10-29 – 2022-10-30 (×5): 60 mL

## 2022-10-29 MED ORDER — PROPOFOL 500 MG/50ML IV EMUL
INTRAVENOUS | Status: DC | PRN
  Administered 2022-10-29: 100 ug/kg/min via INTRAVENOUS

## 2022-10-29 MED ORDER — JEVITY 1.5 CAL/FIBER PO LIQD
275.0000 mL | Freq: Every day | ORAL | Status: DC
  Administered 2022-10-29 – 2022-10-30 (×5): 275 mL
  Filled 2022-10-29: qty 474
  Filled 2022-10-29: qty 1000
  Filled 2022-10-29: qty 474
  Filled 2022-10-29 (×3): qty 1000
  Filled 2022-10-29: qty 474
  Filled 2022-10-29 (×2): qty 1000

## 2022-10-29 MED ORDER — GERHARDT'S BUTT CREAM
TOPICAL_CREAM | Freq: Two times a day (BID) | CUTANEOUS | Status: DC
  Administered 2022-11-06: 1 via TOPICAL
  Filled 2022-10-29: qty 1

## 2022-10-29 MED ORDER — LIDOCAINE 2% (20 MG/ML) 5 ML SYRINGE
INTRAMUSCULAR | Status: DC | PRN
  Administered 2022-10-29: 100 mg via INTRAVENOUS

## 2022-10-29 NOTE — Progress Notes (Signed)
Speech Language Pathology Treatment: Dysphagia;Cognitive-Linquistic  Patient Details Name: [name] MRN: [id number] DOB: 12/23/1960 Today's Date: 10/29/2022 Time: 8295-6213 SLP Time Calculation (min) (ACUTE ONLY): 26 min  Assessment / Plan / Recommendation Clinical Impression  Pt seen today for continued therapeautic trials of Dys 1 textures (purees) and nectar thick liquids via teaspoon with seemingly improved s/s dysphagia and aspiration at bedside. Pt's significant other, [name], present at bedside throughout the session, asking about potential of using a wet swab with RN supervision to moisten pt's mouth between SLP visits. SLP provided education regarding importance of oral care prior to using swab as well as accepting some level of risk associated with anything given PO, with which she verbalized understanding. Given his apparent improvement at bedside, a repeat MBS may be beneficial to assess pt's ability to safely swallow. Recommend pt remain NPO pending results of MBS.  Throughout the session, pt attempted multiple utterances, which were mostly unintelligible. He continues to effectively and appropriately use "more" given Min verbal cues when requesting SLP provide additional teaspoons of POs. When given a choice between two types of nectar thick juice, pt followed commands to point to choice given Max verbal cues. SLP attempted to use picture board to allow pt to request repositioning, which he was unable to effectively point to, although note once repositioned pt said "thank you" intelligibly. Given a model and Max verbal cues, pt was able to say "one" during a counting activity, although was unable to repeat any further numbers. SLP asked Dorann Lodge to provide meaningful pictures of family members to target further language production in future sessions. Will continue to f/u to address language facilitation as able.     HPI HPI: Pt is a 62 y/o male who presented 6/19 with L weakness and  numbness. Pt received TNK then developed new symptoms of aphasia and R weakness. He underwent mechanical thrombectomy L M3 segment MCA 6/19. Pt noted to have small hemorrhage and follow up CT showed progression of hemorrhage 3cm then 7cm and small acute R cerebellar infarct. PMH: CAD s/p PCI w/stent and 1v CABG, multiple CGAs, systolic HF s/p ICD, HLD, HTN.      SLP Plan  Continue with current plan of care      Recommendations for follow up therapy are one component of a multi-disciplinary discharge planning process, led by the attending physician.  Recommendations may be updated based on patient status, additional functional criteria and insurance authorization.    Recommendations  Diet recommendations: NPO Medication Administration: Via alternative means                  Oral care QID   Frequent or constant Supervision/Assistance Dysphagia, oropharyngeal phase (R13.12);Aphasia (R47.01);Dysarthria and anarthria (R47.1);Apraxia (R48.2)     Continue with current plan of care     [name], M.A., CF-SLP Speech Language Pathology, Acute Rehabilitation Services  Secure Chat preferred [phone]   10/29/2022, 2:44 PM

## 2022-10-29 NOTE — Anesthesia Postprocedure Evaluation (Signed)
Anesthesia Post Note  Patient: [name]  Procedure(s) Performed: TRANSESOPHAGEAL ECHOCARDIOGRAM     Patient location during evaluation: PACU Anesthesia Type: MAC Level of consciousness: awake and alert Pain management: pain level controlled Vital Signs Assessment: post-procedure vital signs reviewed and stable Respiratory status: spontaneous breathing Cardiovascular status: stable Anesthetic complications: no   No notable events documented.  Last Vitals:  Vitals:   10/29/22 1335 10/29/22 1405  BP: 96/86 102/72  Pulse: 81 80  Resp: (!) 23 20  Temp:  37.1 C  SpO2: 97% 100%    Last Pain:  Vitals:   10/29/22 1405  TempSrc: Axillary  PainSc:                  [name]

## 2022-10-29 NOTE — Progress Notes (Signed)
STROKE TEAM PROGRESS NOTE   INTERVAL HISTORY Patient seen and evaluated this morning, his significant other and his son are at the bedside.  [name] is on hold due to new increase hemorrhage on CT scan yesterday.  Patient remains severely dysarthric with difficult to understand speech.  He denies significant headache.  His neurological exam appears stable.  Blood pressure adequately controlled. TEE done today shows no evidence of infective endocarditis.  Moderate size PFO with aneurysmal interatrial septum is noted.  Large pseudoaneurysm of the inferior wall with 3/5 cm layered thrombus.  LVEF 30 to 35%. Vitals:   10/29/22 0917 10/29/22 1153 10/29/22 1221 10/29/22 1230  BP: 104/77 97/75  110/87  Pulse:  80  78  Resp: 13   (!) 21  Temp: 98.9 F (37.2 C) 99 F (37.2 C) 97.9 F (36.6 C)   TempSrc: Oral Oral Temporal   SpO2: 99% 100%  96%  Weight:      Height:       CBC:  Recent Labs  Lab 10/26/22 0650 10/28/22 0354  WBC 8.7 9.7  HGB 14.0 13.1  HCT 41.8 39.2  MCV 88.0 89.7  PLT 159 141*   Basic Metabolic Panel:  Recent Labs  Lab 10/26/22 0650 10/28/22 0354  NA 135 137  K 4.1 4.0  CL 100 103  CO2 24 25  GLUCOSE 218* 121*  BUN 54* 48*  CREATININE 1.12 1.08  CALCIUM 9.1 8.9    IMAGING past 24 hours EP STUDY  Result Date: 10/29/2022 See surgical note for result.    Physical Exam  Constitutional: Appears well-developed and well-nourished.  Middle-aged African-American male Cardiovascular: Normal rate and regular rhythm.  Respiratory: Regular, unlabored respirations on supplemental O2 Skin: WDI  Neuro: Mental Status: Patient is awake, alert, speech is severely dysarthric with expressive aphasia.  Able to speak a few words and short sentences but difficult to understand.Following commands briskly on the left side Cranial Nerves: II: Pupils are equal, round, and reactive to light.   III,IV, VI: EOMI without ptosis or diplopia.  VII: Mild right facial droop  VIII:  Hearing is intact to voice XII: tongue is midline without atrophy or fasciculations.  Motor: Tone is normal. Bulk is normal.  Moves left upper and lower extremities to command, will does not move right upper extremity but moves right lower extremity to command     ASSESSMENT/PLAN [name] is a 62 y.o. male with history of CAD s/p PCI w/ stent to LCS and LAD, s/p 1v CABG, multiple CVAs (R cerebellar 04/2022),  systolic HF s/p ICD, HLD, HTN presenting with slurred speech and L sided weakness now s/p TNK and IR thrombectomy of L MCA (M3 segment) c/b  hemorrhage.   TIA - Right brain stroke aborted s/p TNK and later that day second stroke with left M2 occlusion s/p IR Stroke - Left MCA and right cerebellar infarcts left M3 occlusion s/p IR with TICIs, complicated by L frontoparietal ICH and SAH, etiology likely cardioembolic source  CT 6/18 no acute finding, but the bilateral frontal, parietal, occipital and cerebellum infarcts CTA head and neck unremarkable CTA head and neck repeat new short segment occlusion of distal left M2 S/p IR with left M3 TICIs reperfusion  post IR CT Hyperdensity within the left sylvian fissure may represent small subarachnoid hemorrhage versus contrast. CT Head: Progressed hemorrhage in L frontoparietal region w/ 3cm hematoma and regional SAH MRI  progressive L fronto parietal hematoma (~7cm), regional SAH, increased mass effect.  Likely underlying infarct. Sm R cerebellar infarct, acute. Numerous chornic cerebral and cerebellar infarcts Repeat CT 6/19 increase in size of the left frontal/parietal hematoma. New extension into the left lateral ventricle. CT repeat 6/22 x 2 - New component of intraparenchymal hemorrhage in the posterior right hemisphere that measures approximately 2.5 x 1.4 cm. (On my review, more like right brain redistribution instead of new hemorrhage)  Repeat CT 6/23: Unchanged appearance of bilateral mixed intraparenchymal and subarachnoid  hemorrhage. Trace rightward midline shift is unchanged. No hydrocephalus CT repeat 6/25 stable hemorrhage, unchanged CT Head 6/28-  stable hematoma. SAH and IVH have regressed since 10/03/2022.  CT Head 7/14 with new hemorrhage in the previously noted areas of hemorrhage, bilateral occipital horns with slightly increase size of the ventricles concerning of hydrocephalus  Echo: Large inferior pseudoaneurysm with a depth of 3 cm and a maximum width of roughly 5 cm the wall of the pseudoaneurysm is made above the layer of thrombus that is roughly 1.5 cm thick. EF 30 to 35%, severe hypokinesis of left ventricle. No afib with ICD interrogation LE venous doppler neg for DVT TCD bubble study showed moderate sized PFO LDL 63 HgbA1c 5.8 UDS neg VTE prophylaxis - SQH  aspirin 81 mg daily and clopidogrel 75 mg daily prior to admission, was on [name] but due to new area will have to hold [name] for a couple days, or until repeat CT is stable Therapy recommendations:  SNF Disposition:  pending  CHF s/p ICD Systolic HF, s/p ICD 2/2 ICM.  Echo EF 30-35%  Trop 45 -> 43 -> 39 -> 35 CK 3714, CKMB 3.5 CXR 6/25 central vascular congestion and developing interstitial edema.  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  Cardiology on board, appreciate assistance  CTA of the heart intact chronic patch repair  to inferior and inferior septal pseudoaneurysm, large burden of thrombus on top of patch repair, severe biventricular failure with D-shaped septum, occluded SVG to OM [name] held due to new hemorrhage  Ultimately patient will need to repeat undergo repeat pseudoaneurysm repair if he becomes healthy enough to tolerate surgery  Hyperlipidemia Home meds:  atorvastatin 80 LDL 63, goal < 70 Lipitor 80 resumed    Continue statin at discharge  Tobacco abuse Current smoker Smoking cessation counseling will be provided Nicotine patch provided  Fever ? Aspiration pneumonia Tmax 101.6 -> 99.8-> afebrile->100.8 -> 102-> 101.7-> 101.1 -> 101-> 100.3-> 99.4 WBC 7.6->9.4->10.2->9.8->10.2-> 9.2 UA negative CXR central vascular congestion and developing interstitial edema.  CXR 6/26 similar appearance of atelectasis at both lung bases Procalc negative.  Blood culture 2/4 bottles staph epidermidis, ID consulted, appreciate recommendations Blood culture repeat pending -no growth x 3 days NT suction PRN Ceftriaxone -> ancef ID consulted EP on board - not candidate for ICD extraction at this time Cardiology felt pt not candidate for TEE now given respiratory distress, however they are willing to do it if he  is intubated  Dysphagia  Vomiting on zofran PRN, improved Pt ate well 6/24, nocturnal TF d/c'ed -> fever with possible aspiration -> NPO and TF restarted on 6/25 6/27 having difficulty coordinating swallowing and respirations  SLP will need to re evaluate  PFO Known PFO in the past TCD bubble study showed a moderate-sized PFO LE venous Doppler negative for DVT  Not an emergent issue at this time  Other stroke risk factors Obesity with BMI 32.23 CAD  Other Active Problems Thrombocytopenia, platelet 110->89-> 172-> 190-> stable CK 436 -> 3714 -> 610-> 490  Hospital day # 27   I have personally obtained history,examined this patient, reviewed notes, independently viewed imaging  studies, participated in medical decision making and plan of care.ROS completed by me personally and pertinent positives fully documented  I have made any additions or clarifications directly to the above note.  Patient neurological exam remains stable and headache appears to have improved however repeat CT yesterday showed diffuse increased areas of hemorrhage hence [name] is currently on hold.  Long discussion at the bedside with the patient's significant other as well as son Kregg about as being stuck between a rock and a hard place.  Using anticoagulation to reduce risk of thromboembolism from his left ventricular aneurysm and clot carries risk for bleeding which he has now had twice.  Family unfortunately struggling with decisions about goals of care as patient had not clearly expressed it.  They have met with palliative care team and agreed to DNR/DNI but continue all available medical interventions to prolong life.  Greater than 50% time during this 35-minute visit was spent in counseling and coordination of care about his stroke and intracerebral hemorrhage and bleeding and stroke risk and answering questions.  Discussed with Dr. Raelyn Number, MD Medical Director [facility] Pager: [phone] 10/29/2022 1:23 PM

## 2022-10-29 NOTE — Progress Notes (Addendum)
    [facility]   Reason for visit: Follow up on positive blood culture  Interval History: TEE today with no signs of vegetations.     Physical Exam: Constitutional:  Vitals:   10/29/22 1320 10/29/22 1324  BP: 98/81   Pulse: 78   Resp: (!) 22   Temp:  98.2 F (36.8 C)  SpO2: 98%    patient appears in NAD Respiratory: Normal respiratory effort  Review of Systems: Constitutional: negative for fevers and chills  Lab Results  Component Value Date   WBC 9.7 10/28/2022   HGB 13.1 10/28/2022   HCT 39.2 10/28/2022   MCV 89.7 10/28/2022   PLT 141 (L) 10/28/2022    Lab Results  Component Value Date   CREATININE 1.08 10/28/2022   BUN 48 (H) 10/28/2022   NA 137 10/28/2022   K 4.0 10/28/2022   CL 103 10/28/2022   CO2 25 10/28/2022    Lab Results  Component Value Date   ALT 19 10/24/2022   AST 27 10/24/2022   ALKPHOS 104 10/24/2022     Microbiology: No results found for this or any previous visit (from the past 240 hour(s)).  Impression/Plan:  1. Bacteremia - positive blood cultures with Staph epi in 2 sets and now s/p treatment for > 2 weeks.  TEE negative for any valvular or ICD-related infection.  At this point, will stop antibiotics.    I have personally spent 35 minutes involved in face-to-face and non-face-to-face activities for this patient on the day of the visit. Professional time spent includes the following activities: Preparing to see the patient (review of tests), Obtaining and/or reviewing separately obtained history (admission/discharge record), Performing a medically appropriate examination and/or evaluation , Ordering medications/tests/procedures, referring and communicating with other health care professionals, Documenting clinical information in the EMR, Independently interpreting results (not separately reported), Communicating results to the patient/family/caregiver, Counseling and educating the patient/family/caregiver and Care  coordination (not separately reported).

## 2022-10-29 NOTE — Transfer of Care (Signed)
Immediate Anesthesia Transfer of Care Note  Patient: [name]  Procedure(s) Performed: TRANSESOPHAGEAL ECHOCARDIOGRAM  Patient Location:  CPU  Anesthesia Type:MAC  Level of Consciousness: responds to stimulation  Airway & Oxygen Therapy: Patient Spontanous Breathing and Patient connected to nasal cannula oxygen  Post-op Assessment: Report given to RN and Post -op Vital signs reviewed and stable  Post vital signs: Reviewed and stable  Last Vitals:  Vitals Value Taken Time  BP 87/62 10/29/22 1306  Temp    Pulse 72 10/29/22 1308  Resp 24 10/29/22 1308  SpO2 98 % 10/29/22 1308  Vitals shown include unfiled device data.  Last Pain:  Vitals:   10/29/22 1221  TempSrc: Temporal  PainSc:       Patients Stated Pain Goal: 0 (10/29/22 0800)  Complications: No notable events documented.

## 2022-10-29 NOTE — Progress Notes (Signed)
Progress Note   Patient: [name] UVO:[id number] DOB: [birth year] DOA: 10/02/2022     27 DOS: the patient was seen and examined on 10/29/2022       Brief hospital course: Mr. Fehl is a 62 y.o. 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     Significant events: 6/18: Admitted for stroke, received TNK; developed right sided deficits overnight after TNK, underwent NIR thrombectomy 6/19: Post thrombectomy CT showed progression of left frontal IPH and SAH 6/23: Repeat echo shows dehiscence of prior pseudoaneurysm patch with adherent LV thrombus; Cardiology consulted 6/26: ID consulted for MSSE bacteremia 6/28: EP consulted, recommended not to take out ICD 7/2: Transferred OOU 7/10: Placed PEG tube 7/11: Transitioned to basal bolus feedings and switched to Eliquis 7/13: Increased HA, repeat CT shows progression of ICHs; Eliquis held 7/15: TEE negative, antibiotics stopped    Significant studies: 6/18 CTA head and neck: no LVO 6/18 repeat CTA head and neck: new distal left MCA M2 occlusion 6/19 Echo: poor quality images, nondiagnostic 6/19 TCD: positive study, indicating R>L shunt 6/19 LE doppler: no DVT 6/19 CT head: left frontal parietal IPH and adjacent [facility] 6/22 CT head: size now 8x4x4cm, with new 2cm component 6/23 complete echo: now shows recurrence of pseudoaneurysm, dehiscence of patch with adherent thrombus 6/25 coronary CTA: confirms above 6/28 CT head: stable large left frontoparietal hematoma 7/13: CT head shows worsening ICH 7/15 TEE: shows pseudoaneurysm with adherent clot, no valvular vegetations, no signs of vegetation on ICD leads   Significant microbiology data: 6/18 MRSA nares: Negative 6/25 Blood cultures: MSSE in 2/2 6/27 blood cultures: NG in 2/2    Procedures: 6/18: TNK at 1246 6/19:  Thrombectomy by Dr. Tommie Sams 6/19: Cortrak placed 7/10: PEG placed 7/15: TEE    Consults: Cardiology Infectious disease Electrophysiology Critical Care Palliative Care General Surgery      Assessment and Plan: * Stroke (cerebrum) (HCC) Intraparenchymal hematoma CT on 7/13 showed slight diminished size of largest left frontal parietal lobe hematoma, but increased size of left periventricular hematoma, and several small new acute appearing hemorrhages posterior to this, and also in the right occipital lobe, left occipital lobe, left cerebellum and some hemorrhage layering in the bilateral occipital horns - Stop Eliquis - Repeat CT in 48 hours   Bacteremia due to Gram-positive bacteria TEE showed no valvular vegetations and no evidence of infection of the ICD leads.  Infectious disease recommended stopping antibiotics now for 2 weeks.   Pseudoaneurysm of left ventricle of heart Ultimately, if his neurological recovery allows, he will need repeat pseudoaneurysm repair.  This would be high risk - Hold anticoagulation for now    CHF HTN Appears euvolemic - Continue Entresto, BB, spironolactone, Lasix   Dyslipidemia - Continue Lipitor  Dysphagia - Continue tube feeds           Subjective: Patient still has headache, but overall improved from yesterday.  No fever, confusion, vomiting.  No seizures.     Physical Exam: BP 102/72 (BP Location: Left Arm)   Pulse 80   Temp 98.7 F (37.1 C) (Axillary)   Resp (!) 23   Ht 6\' 1"  (1.854 m)   Wt 97.8 kg   SpO2 100%   BMI 28.45 kg/m   Adult male, lying in bed, appears weak and tired, flaccid and starting  to contracture on the right, left arm strength seems 4/5.  Heavily dysarthric. RRR, no murmurs, no peripheral edema Respiratory normal, lungs clear without rales or wheezes   Data Reviewed: Discussed with neurology Glucose normal  Family Communication: Girlfriend [place] and son Macio at the  bedside    Disposition: Status is: Inpatient         Author: Alberteen Sam, MD 10/29/2022 3:23 PM  For on call review [email or web address].

## 2022-10-29 NOTE — Progress Notes (Signed)
Patient ID: LEVI KLAIBER, male   DOB: 12/29/1960, 62 y.o.   MRN: [id number]    Progress Note from the Palliative Medicine Team at [facility]   Patient Name: KARLIN HEILMAN        Date: 10/29/2022 DOB: 1961/03/04  Age: 62 y.o. MRN#: [id number] Attending Physician: Alberteen Sam, * Primary Care Physician: [name], MD Admit Date: 10/02/2022   Extensive chart review has been completed prior to meeting with patient/family  including labs, vital signs, imaging, progress/consult notes, orders, medications and available advance directive documents.    62 y/o male who presented 6/19 with L weakness and numbness. Pt received TNK then developed new symptoms of aphasia and R weakness. He underwent mechanical thrombectomy L M3 segment MCA 6/19. Pt noted to have small hemorrhage and follow up CT showed progression of hemorrhage 3cm then 7cm and small acute R cerebellar infarct. PMH: CAD s/p PCI w/stent and 1v CABG, multiple CGAs, systolic HF s/p ICD, HLD, HTN.   10-16-22 Initial PMT consult  S/p PEG  placement  10-27-22 repeat head CT, showed progression of ICH  10-29-22 plan for TEE  This NP assessed patient at the bedside as a follow up palliative medicine needs and emotional support, and to meet with family as scheduled for continued conversation regarding treatment option decisions , advanced directive decisions and anticipatory care needs.    Patient is alert, he attempts his speech, it is only guttural sounds.  He is following simple commands.  He nods yes and no appropriately to simple questions.  He appears comfortable, he denies headache  [name] Sou Nohr, and patient's SO [name] at the bedside.   Dr Maryfrances Bunnell updated family regarding current medical situation.   All questions and concerns addressed.    We explored possible outcomes from a best case scenario to worst-case scenario.  I expressed my concern over the seriousness of patient's current  medical situation and is high risk for decompensation even within the context of full medical support, secondary to his multiple comorbidities.  I have verbalized my concern for Mr. Iafrate and the likely projected trajectory at the skilled nursing facility for continued rehabilitation;   increased risk of infections; aspiration , upper respiratory, UTI, skin.  Plan of Care: -DNR/DNI- documented today -continue with all offered and available medical interventions to prolong life -Ongoing conversations with medical team and family members regarding goals of care dependent on patient outcomes  These are very difficult decisions that family is facing.  We discussed the importance in  consideration of Mr. [place] values, personal feelings regarding quality of life.  PMT will continue to support holistically    Questions and concerns addressed  Time: 61    minutes  [name] NP  Palliative Medicine Team Team Phone # [phone] Pager [phone]

## 2022-10-29 NOTE — Progress Notes (Signed)
PT Cancellation Note  Patient Details Name: [name] MRN: [id number] DOB: 10/21/1960   Cancelled Treatment:    Reason Eval/Treat Not Completed: Patient at procedure or test/unavailable  Off unit for testing. Will continue to follow. Acknowledged extension of hemorrhage on CT.  Berton Mount 10/29/2022, 1:34 PM

## 2022-10-29 NOTE — Significant Event (Signed)
Spoke with patient's daughter and spouse who both defer consents to be sign by the son [name]. RN spoke with son who does not want to give consent over the phone but will be coming to the unit for a meeting with the palliative team and can sign it then. RN updated the cath staff regarding this.

## 2022-10-29 NOTE — Interval H&P Note (Deleted)
History and Physical Interval Note:  10/29/2022 8:21 AM  [name]  has presented today for surgery, with the diagnosis of Stroke.  The various methods of treatment have been discussed with the patient and family. After consideration of risks, benefits and other options for treatment, the patient has consented to  Procedure(s): TRANSESOPHAGEAL ECHOCARDIOGRAM (N/A) as a surgical intervention.  The patient's history has been reviewed, patient examined, no change in status, stable for surgery.  I have reviewed the patient's chart and labs.  Questions were answered to the patient's satisfaction.     [name]

## 2022-10-29 NOTE — Interval H&P Note (Signed)
History and Physical Interval Note:  10/29/2022 12:35 PM  [name]  has presented today for surgery, with the diagnosis of Stroke.  The various methods of treatment have been discussed with the patient and family. After consideration of risks, benefits and other options for treatment, the patient has consented to  Procedure(s): TRANSESOPHAGEAL ECHOCARDIOGRAM (N/A) as a surgical intervention.  The patient's history has been reviewed, patient examined, no change in status, stable for surgery.  I have reviewed the patient's chart and labs.  Questions were answered to the patient's satisfaction.     [name]

## 2022-10-29 NOTE — Progress Notes (Signed)
SLP Cancellation Note  Patient Details Name: [name] MRN: [id number] DOB: 09-11-1960   Cancelled treatment:       Reason Eval/Treat Not Completed: Patient at procedure or test/unavailable. Will f/u as able.    [name], M.A., CF-SLP Speech Language Pathology, Acute Rehabilitation Services  Secure Chat preferred [phone]  10/29/2022, 12:11 PM

## 2022-10-29 NOTE — Progress Notes (Signed)
No ICM remote transmission received for 10/29/2022 due to patient currently hospitalized and next ICM transmission scheduled for 11/26/2022.

## 2022-10-29 NOTE — Significant Event (Signed)
Notified by cath lab that consent has not been obtain. RN called patient's family (both son and daughter); no one answered. Left a message and call back number.

## 2022-10-29 NOTE — CV Procedure (Signed)
TRANSESOPHAGEAL ECHOCARDIOGRAM (TEE) NOTE  INDICATIONS: infective endocarditis and cryptogenic stroke  PROCEDURE:   Informed consent was obtained prior to the procedure. The risks, benefits and alternatives for the procedure were discussed and the patient comprehended these risks.  Risks include, but are not limited to, cough, sore throat, vomiting, nausea, somnolence, esophageal and stomach trauma or perforation, bleeding, low blood pressure, aspiration, pneumonia, infection, trauma to the teeth and death.    After a procedural time-out, the patient was given propofol for sedation by anesthesia. See their separate report.  The patient's heart rate, blood pressure, and oxygen saturation are monitored continuously during the procedure.The oropharynx was anesthetized with topical cetacaine.  The transesophageal probe was inserted in the esophagus and stomach without difficulty and multiple views were obtained.  The patient was kept under observation until the patient left the procedure room.  I was present face-to-face 100% of this time. The patient left the procedure room in stable condition.   Agitated microbubble saline contrast was administered.  COMPLICATIONS:    There were no immediate complications.  Findings:  LEFT VENTRICLE: The left ventricular wall thickness is mildly increased.  The left ventricular cavity is dilated in size. Wall motion demonstrates inferior and inferoseptal akinesis. There is a previously noted large pseudoanerysm measuring roughly 3 x 5 cm with a layer of thrombotic material.  LVEF is 30-35%.  RIGHT VENTRICLE:  The right ventricle appears hypokinetic without any thrombus or masses.  Devices wires are noted without signs of endocarditis.  LEFT ATRIUM:  The left atrium is normal in size without any thrombus or masses.  There is not spontaneous echo contrast ("smoke") in the left atrium consistent with a low flow state.  LEFT ATRIAL APPENDAGE:  The left  atrial appendage is free of any thrombus or masses. The appendage has single lobes. Pulse doppler indicates moderate flow in the appendage.  ATRIAL SEPTUM:  The atrial septum is aneurysmal. There is evidence for interatrial shunting by color doppler and saline microbubble, suggestive a moderate-sized PFO with predominantly right to left flow.  RIGHT ATRIUM:  The right atrium is normal in size and function without any thrombus or masses.  MITRAL VALVE:  The mitral valve is normal in structure and function with  trivial  regurgitation.  There were no vegetations or stenosis.  AORTIC VALVE:  The aortic valve is trileaflet, normal in structure and function with  no  regurgitation.  There were no vegetations or stenosis  TRICUSPID VALVE:  The tricuspid valve is normal in structure and function with  no  regurgitation.  There were no vegetations or stenosis   PULMONIC VALVE:  The pulmonic valve is normal in structure and function with  no  regurgitation.  There were no vegetations or stenosis.   AORTIC ARCH, ASCENDING AND DESCENDING AORTA:  There was grade 2 Myrtis Ser et. Al, 1992) atherosclerosis of the distal aortic arch and proximal descending aorta.  12. PULMONARY VEINS: Anomalous pulmonary venous return was not noted.  13. PERICARDIUM: The pericardium appeared normal and non-thickened.  There is no pericardial effusion.  IMPRESSION:   No evidence for infective endocarditis. Moderate-sized PFO with spontaneous right to left flow noted No LAA thrombus Large pseudoaneurysm of the inferior wall, roughly 3 x 5 cm with layered thrombus (previously identified and repaired - CT demonstrated intact repair with thrombus on top of the repair). RV hypokinesis- mild to moderate LVEF 30-35%, mildly dilated LV with inferior and inferoseptal akinesis  RECOMMENDATIONS:    Management per  ID and neurology. Consider evaluation for PFO closure, however, unclear if he is a candidate for this.  Time Spent  Directly with the Patient:  45 minutes   [name], MD, [facility], FACP  [place]  [facility]  Medical Director of the Advanced Lipid Disorders &  Cardiovascular Risk Reduction Clinic Diplomate of the American Board of Clinical Lipidology Attending Cardiologist  Direct Dial: [phone]  Fax: [phone]  Website:  [email or web address] Bowden [email or web address] 10/29/2022, 1:01 PM

## 2022-10-29 NOTE — Plan of Care (Signed)
 Problem: Education: Goal: Knowledge of disease or condition will improve Outcome: Progressing Goal: Knowledge of secondary prevention will improve (MUST DOCUMENT ALL) Outcome: Progressing Goal: Knowledge of patient specific risk factors will improve [name] N/A or DELETE if not current risk factor) Outcome: Progressing   Problem: Ischemic Stroke/TIA Tissue Perfusion: Goal: Complications of ischemic stroke/TIA will be minimized Outcome: Progressing   Problem: Coping: Goal: Will verbalize positive feelings about self Outcome: Progressing Goal: Will identify appropriate support needs Outcome: Progressing   Problem: Health Behavior/Discharge Planning: Goal: Ability to manage health-related needs will improve Outcome: Progressing Goal: Goals will be collaboratively established with patient/family Outcome: Progressing   Problem: Self-Care: Goal: Ability to participate in self-care as condition permits will improve Outcome: Progressing Goal: Verbalization of feelings and concerns over difficulty with self-care will improve Outcome: Progressing Goal: Ability to communicate needs accurately will improve Outcome: Progressing   Problem: Nutrition: Goal: Risk of aspiration will decrease Outcome: Progressing Goal: Dietary intake will improve Outcome: Progressing   Problem: Education: Goal: Knowledge of General Education information will improve Description: Including pain rating scale, medication(s)/side effects and non-pharmacologic comfort measures Outcome: Progressing   Problem: Health Behavior/Discharge Planning: Goal: Ability to manage health-related needs will improve Outcome: Progressing   Problem: Clinical Measurements: Goal: Ability to maintain clinical measurements within normal limits will improve Outcome: Progressing Goal: Will remain free from infection Outcome: Progressing Goal: Diagnostic test results will improve Outcome: Progressing Goal: Respiratory  complications will improve Outcome: Progressing Goal: Cardiovascular complication will be avoided Outcome: Progressing   Problem: Activity: Goal: Risk for activity intolerance will decrease Outcome: Progressing   Problem: Nutrition: Goal: Adequate nutrition will be maintained Outcome: Progressing   Problem: Coping: Goal: Level of anxiety will decrease Outcome: Progressing   Problem: Elimination: Goal: Will not experience complications related to bowel motility Outcome: Progressing Goal: Will not experience complications related to urinary retention Outcome: Progressing   Problem: Pain Managment: Goal: General experience of comfort will improve Outcome: Progressing   Problem: Safety: Goal: Ability to remain free from injury will improve Outcome: Progressing   Problem: Skin Integrity: Goal: Risk for impaired skin integrity will decrease Outcome: Progressing   Problem: Education: Goal: Understanding of CV disease, CV risk reduction, and recovery process will improve Outcome: Progressing Goal: Individualized Educational Video(s) Outcome: Progressing   Problem: Activity: Goal: Ability to return to baseline activity level will improve Outcome: Progressing   Problem: Cardiovascular: Goal: Ability to achieve and maintain adequate cardiovascular perfusion will improve Outcome: Progressing Goal: Vascular access site(s) Level 0-1 will be maintained Outcome: Progressing   Problem: Health Behavior/Discharge Planning: Goal: Ability to safely manage health-related needs after discharge will improve Outcome: Progressing   Problem: Education: Goal: Ability to describe self-care measures that may prevent or decrease complications (Diabetes Survival Skills Education) will improve Outcome: Progressing Goal: Individualized Educational Video(s) Outcome: Progressing   Problem: Coping: Goal: Ability to adjust to condition or change in health will improve Outcome: Progressing    Problem: Fluid Volume: Goal: Ability to maintain a balanced intake and output will improve Outcome: Progressing   Problem: Health Behavior/Discharge Planning: Goal: Ability to identify and utilize available resources and services will improve Outcome: Progressing Goal: Ability to manage health-related needs will improve Outcome: Progressing   Problem: Metabolic: Goal: Ability to maintain appropriate glucose levels will improve Outcome: Progressing   Problem: Nutritional: Goal: Maintenance of adequate nutrition will improve Outcome: Progressing Goal: Progress toward achieving an optimal weight will improve Outcome: Progressing   Problem: Skin Integrity: Goal: Risk for impaired skin  integrity will decrease Outcome: Progressing   Problem: Tissue Perfusion: Goal: Adequacy of tissue perfusion will improve Outcome: Progressing

## 2022-10-30 ENCOUNTER — Encounter (HOSPITAL_COMMUNITY): Payer: Self-pay | Admitting: Internal Medicine

## 2022-10-30 ENCOUNTER — Inpatient Hospital Stay (HOSPITAL_COMMUNITY): Payer: Medicare PPO

## 2022-10-30 DIAGNOSIS — I6300 Cerebral infarction due to thrombosis of unspecified precerebral artery: Secondary | ICD-10-CM | POA: Diagnosis not present

## 2022-10-30 LAB — BASIC METABOLIC PANEL
Anion gap: 9 (ref 5–15)
BUN: 39 mg/dL — ABNORMAL HIGH (ref 8–23)
CO2: 25 mmol/L (ref 22–32)
Calcium: 8.7 mg/dL — ABNORMAL LOW (ref 8.9–10.3)
Chloride: 101 mmol/L (ref 98–111)
Creatinine, Ser: 0.92 mg/dL (ref 0.61–1.24)
GFR, Estimated: >60 mL/min (ref >60)
Glucose, Bld: 196 mg/dL — ABNORMAL HIGH (ref 70–99)
Potassium: 4 mmol/L (ref 3.5–5.1)
Sodium: 135 mmol/L (ref 135–145)

## 2022-10-30 LAB — GLUCOSE, CAPILLARY
Glucose-Capillary: 109 mg/dL — ABNORMAL HIGH (ref 70–99)
Glucose-Capillary: 120 mg/dL — ABNORMAL HIGH (ref 70–99)
Glucose-Capillary: 159 mg/dL — ABNORMAL HIGH (ref 70–99)
Glucose-Capillary: 160 mg/dL — ABNORMAL HIGH (ref 70–99)
Glucose-Capillary: 241 mg/dL — ABNORMAL HIGH (ref 70–99)
Glucose-Capillary: 68 mg/dL — ABNORMAL LOW (ref 70–99)
Glucose-Capillary: 75 mg/dL (ref 70–99)

## 2022-10-30 LAB — CBC
HCT: 39.1 % (ref 39.0–52.0)
Hemoglobin: 13.1 g/dL (ref 13.0–17.0)
MCH: 30.3 pg (ref 26.0–34.0)
MCHC: 33.5 g/dL (ref 30.0–36.0)
MCV: 90.3 fL (ref 80.0–100.0)
Platelets: 125 10*3/uL — ABNORMAL LOW (ref 150–400)
RBC: 4.33 MIL/uL (ref 4.22–5.81)
RDW: 12.5 % (ref 11.5–15.5)
WBC: 7.9 10*3/uL (ref 4.0–10.5)
nRBC: 0 % (ref 0.0–0.2)

## 2022-10-30 MED ORDER — SODIUM CHLORIDE 0.9 % IV BOLUS
500.0000 mL | Freq: Once | INTRAVENOUS | Status: AC
  Administered 2022-10-30: 500 mL via INTRAVENOUS

## 2022-10-30 MED ORDER — ATORVASTATIN CALCIUM 80 MG PO TABS
80.0000 mg | ORAL_TABLET | Freq: Every day | ORAL | Status: DC
  Administered 2022-10-31 – 2022-11-16 (×17): 80 mg via ORAL
  Filled 2022-10-30 (×17): qty 1

## 2022-10-30 MED ORDER — INSULIN ASPART 100 UNIT/ML IJ SOLN: 0.0000 [IU] | Freq: Every day | INTRAMUSCULAR | Status: DC

## 2022-10-30 MED ORDER — FAMOTIDINE 20 MG PO TABS
20.0000 mg | ORAL_TABLET | Freq: Two times a day (BID) | ORAL | Status: DC
  Administered 2022-10-30 – 2022-11-16 (×34): 20 mg via ORAL
  Filled 2022-10-30 (×34): qty 1

## 2022-10-30 MED ORDER — JEVITY 1.5 CAL/FIBER PO LIQD
415.0000 mL | Freq: Three times a day (TID) | ORAL | Status: DC
  Administered 2022-10-30 – 2022-11-06 (×26): 415 mL
  Filled 2022-10-30 (×31): qty 474

## 2022-10-30 MED ORDER — INSULIN ASPART 100 UNIT/ML IJ SOLN
0.0000 [IU] | Freq: Three times a day (TID) | INTRAMUSCULAR | Status: DC
  Administered 2022-10-31 – 2022-11-01 (×3): 3 [IU] via SUBCUTANEOUS
  Administered 2022-11-03: 2 [IU] via SUBCUTANEOUS
  Administered 2022-11-03: 3 [IU] via SUBCUTANEOUS
  Administered 2022-11-05: 2 [IU] via SUBCUTANEOUS

## 2022-10-30 MED ORDER — SPIRONOLACTONE 12.5 MG HALF TABLET
12.5000 mg | ORAL_TABLET | Freq: Every day | ORAL | Status: DC
  Administered 2022-10-31 – 2022-11-16 (×17): 12.5 mg via ORAL
  Filled 2022-10-30 (×17): qty 1

## 2022-10-30 MED ORDER — BANATROL TF EN LIQD
60.0000 mL | Freq: Two times a day (BID) | ENTERAL | Status: DC
  Administered 2022-10-30 – 2022-11-15 (×31): 60 mL
  Filled 2022-10-30 (×31): qty 60

## 2022-10-30 MED ORDER — ADULT MULTIVITAMIN W/MINERALS CH
1.0000 | ORAL_TABLET | Freq: Every day | ORAL | Status: DC
  Administered 2022-10-31 – 2022-11-16 (×17): 1 via ORAL
  Filled 2022-10-30 (×17): qty 1

## 2022-10-30 MED ORDER — FREE WATER
60.0000 mL | Freq: Three times a day (TID) | Status: DC
  Administered 2022-10-30 – 2022-11-15 (×57): 60 mL

## 2022-10-30 NOTE — Progress Notes (Signed)
Trauma/Critical Care Follow Up Note  Subjective:    Overnight Issues:  No further bleeding.   Objective:  Vital signs for last 24 hours: Temp:  [97.8 F (36.6 C)-99 F (37.2 C)] 98.3 F (36.8 C) (07/16 0357) Pulse Rate:  [75-92] 79 (07/16 0357) Resp:  [13-26] 18 (07/16 0357) BP: (87-110)/(62-87) 100/72 (07/16 0357) SpO2:  [96 %-100 %] 99 % (07/16 0357) Weight:  [98.8 kg] 98.8 kg (07/16 0500)   Intake/Output from previous day: 07/15 0701 - 07/16 0700 In: 1380 [I.V.:200; NG/GT:1180] Out: 1950 [Urine:1950]  Intake/Output this shift: No intake/output data recorded.   Physical Exam:  Abd: Soft, NT, ND, incisions c/d/I, g-tube in place without signs of bleeding or surrounding skin changes.   Results for orders placed or performed during the hospital encounter of 10/02/22 (from the past 24 hour(s))  Glucose, capillary     Status: Abnormal   Collection Time: 10/29/22 11:55 AM  Result Value Ref Range   Glucose-Capillary 108 (H) 70 - 99 mg/dL   Comment 1 Notify RN   Glucose, capillary     Status: Abnormal   Collection Time: 10/29/22  4:37 PM  Result Value Ref Range   Glucose-Capillary 159 (H) 70 - 99 mg/dL   Comment 1 Notify RN   Glucose, capillary     Status: None   Collection Time: 10/29/22  7:56 PM  Result Value Ref Range   Glucose-Capillary 89 70 - 99 mg/dL   Comment 1 Notify RN    Comment 2 Document in Chart   Glucose, capillary     Status: Abnormal   Collection Time: 10/29/22 11:59 PM  Result Value Ref Range   Glucose-Capillary 241 (H) 70 - 99 mg/dL   Comment 1 Notify RN    Comment 2 Document in Chart   Glucose, capillary     Status: Abnormal   Collection Time: 10/30/22  3:58 AM  Result Value Ref Range   Glucose-Capillary 68 (L) 70 - 99 mg/dL   Comment 1 Notify RN    Comment 2 Document in Chart   Glucose, capillary     Status: Abnormal   Collection Time: 10/30/22  4:22 AM  Result Value Ref Range   Glucose-Capillary 109 (H) 70 - 99 mg/dL  CBC     Status:  Abnormal   Collection Time: 10/30/22  6:05 AM  Result Value Ref Range   WBC 7.9 4.0 - 10.5 K/uL   RBC 4.33 4.22 - 5.81 MIL/uL   Hemoglobin 13.1 13.0 - 17.0 g/dL   HCT 30.8 65.7 - 84.6 %   MCV 90.3 80.0 - 100.0 fL   MCH 30.3 26.0 - 34.0 pg   MCHC 33.5 30.0 - 36.0 g/dL   RDW 96.2 95.2 - 84.1 %   Platelets 125 (L) 150 - 400 K/uL   nRBC 0.0 0.0 - 0.2 %  Basic metabolic panel     Status: Abnormal   Collection Time: 10/30/22  6:05 AM  Result Value Ref Range   Sodium 135 135 - 145 mmol/L   Potassium 4.0 3.5 - 5.1 mmol/L   Chloride 101 98 - 111 mmol/L   CO2 25 22 - 32 mmol/L   Glucose, Bld 196 (H) 70 - 99 mg/dL   BUN 39 (H) 8 - 23 mg/dL   Creatinine, Ser 3.24 0.61 - 1.24 mg/dL   Calcium 8.7 (L) 8.9 - 10.3 mg/dL   GFR, Estimated >40 >10 mL/min   Anion gap 9 5 - 15    Assessment &  Plan:  Present on Admission:  Stroke (cerebrum) (HCC)  Bacteremia due to Gram-positive bacteria  Dyslipidemia  Essential hypertension  ICD (implantable cardioverter-defibrillator) in place  Obesity (BMI 30.0-34.9)  Coronary artery disease  Acute on chronic systolic CHF (congestive heart failure) (HCC)  Pseudoaneurysm of left ventricle of heart  Acute respiratory failure with hypoxia (HCC)    LOS: 28 days    POD 6, s/p lap assisted g-tube placement, Dr. Bedelia Person 7/10 for CVA with dysphagia  - Bleeding has now resolved after abdominal binder and synching down flange on PEG.  -phalange at 5cm at skin level. -we will sign off, but available if needed   FEN: TFs VTE: Eliquis ID: on ancef for bacteremia  Letha Cape, PA-C Trauma & General Surgery Please use [email or web address] to contact on call provider  10/30/2022  *Care during the described time interval was provided by me. I have reviewed this patient's available data, including medical history, events of note, physical examination and test results as part of my evaluation.

## 2022-10-30 NOTE — Progress Notes (Signed)
Physical Therapy Treatment Patient Details Name: [name] MRN: [id number] DOB: 1960-08-18 Today's Date: 10/30/2022   History of Present Illness Patient is a 62 y/o male admitted 10/03/22 with L weakness and numbness.  Received TNK then developed now symptoms of aphasia and R weakness.  He underwent mechanical thrombectomy L M3 segment MCA noted to have small hemorrhage and follow up CT showed progression of hemorrhage 3cm then 7cm and small acute R cerebellar infarct. PEG placed 7/10. CT on 7/13 showed slight diminished size of largest left frontal parietal lobe hematoma, but increased size of left periventricular hematoma, and several small new acute appearing hemorrhages posterior to this, and also in the right occipital lobe, left occipital lobe, left cerebellum and some hemorrhage layering in the bilateral occipital horns PMH positive for CAD s/p PCI w/stent and 1v CABG, multiple CGAs, systolic HF s/p ICD, HLD, HTN.    PT Comments  Pt continues to demonstrate significant impairments with strength, balance, cognition, and mobility. Patient will benefit from continued inpatient follow up therapy, <3 hours/day      Assistance Recommended at Discharge Frequent or constant Supervision/Assistance  If plan is discharge home, recommend the following:  Can travel by private vehicle    Two people to help with walking and/or transfers;A lot of help with bathing/dressing/bathroom;Assistance with cooking/housework;Direct supervision/assist for medications management;Assist for transportation;Help with stairs or ramp for entrance;Assistance with feeding   No  Equipment Recommendations  Other (comment) (to be determined)    Recommendations for Other Services       Precautions / Restrictions Precautions Precautions: Fall;Other (comment) Precaution Comments: PEG Restrictions Weight Bearing Restrictions: No     Mobility  Bed Mobility Overal bed mobility: Needs Assistance Bed Mobility:  Rolling, Supine to Sit, Sit to Supine Rolling: Max assist   Supine to sit: +2 for physical assistance, Max assist Sit to supine: +2 for physical assistance, Total assist   General bed mobility comments: Assist for all aspects    Transfers                   General transfer comment: Deferred due to fatigue of pt    Ambulation/Gait                   Stairs             Wheelchair Mobility     Tilt Bed    Modified Rankin (Stroke Patients Only) Modified Rankin (Stroke Patients Only) Pre-Morbid Rankin Score: Moderate disability Modified Rankin: Severe disability     Balance Overall balance assessment: Needs assistance Sitting-balance support: Single extremity supported, Feet supported Sitting balance-Leahy Scale: Poor Sitting balance - Comments: Sat EOB x 10 minutes with initially min assist and progressing to min guard assist. Began reaching activities and pt indicated fatigue and wanted to lie back down                                    Cognition Arousal/Alertness: Awake/alert Behavior During Therapy: Flat affect Overall Cognitive Status: Difficult to assess                                 General Comments: Follows 1 step commands with incr time on lt side. Decr attention to rt        Exercises      General Comments  Pertinent Vitals/Pain Pain Assessment Pain Assessment: Faces Faces Pain Scale: Hurts whole lot Pain Location: RLE with movement Pain Descriptors / Indicators: Grimacing, Guarding Pain Intervention(s): Monitored during session, Repositioned, Limited activity within patient's tolerance    Home Living                          Prior Function            PT Goals (current goals can now be found in the care plan section) Acute Rehab PT Goals PT Goal Formulation: Patient unable to participate in goal setting Time For Goal Achievement: 11/13/22 Potential to Achieve Goals:  Fair Progress towards PT goals: Goals downgraded-see care plan    Frequency    Min 2X/week      PT Plan Current plan remains appropriate;Frequency needs to be updated    Co-evaluation              AM-PAC PT "6 Clicks" Mobility   Outcome Measure  Help needed turning from your back to your side while in a flat bed without using bedrails?: A Lot Help needed moving from lying on your back to sitting on the side of a flat bed without using bedrails?: Total Help needed moving to and from a bed to a chair (including a wheelchair)?: Total Help needed standing up from a chair using your arms (e.g., wheelchair or bedside chair)?: Total Help needed to walk in hospital room?: Total Help needed climbing 3-5 steps with a railing? : Total 6 Click Score: 7    End of Session   Activity Tolerance: Patient limited by fatigue Patient left: in bed;with call bell/phone within reach;with bed alarm set   PT Visit Diagnosis: Other abnormalities of gait and mobility (R26.89);Other symptoms and signs involving the nervous system (R29.898);Hemiplegia and hemiparesis Hemiplegia - Right/Left: Right Hemiplegia - dominant/non-dominant: Dominant Hemiplegia - caused by: Cerebral infarction;Other Nontraumatic intracranial hemorrhage     Time: 1200-1220 PT Time Calculation (min) (ACUTE ONLY): 20 min  Charges:    $Therapeutic Activity: 8-22 mins PT General Charges $$ ACUTE PT VISIT: 1 Visit                     [facility] PT Acute Rehabilitation Services Office [phone]    [name] [facility] 10/30/2022, 1:21 PM

## 2022-10-30 NOTE — Progress Notes (Signed)
Pt's cbg 68 at 0358. 4oz juice given via pegtube at 0405. Repeat cbg 109 at 0422. Carmen Vallecillo S Marrio Scribner

## 2022-10-30 NOTE — Plan of Care (Signed)
  Problem: Clinical Measurements: Goal: Respiratory complications will improve Outcome: Progressing   Problem: Education: Goal: Knowledge of disease or condition will improve Outcome: Not Progressing   Problem: Ischemic Stroke/TIA Tissue Perfusion: Goal: Complications of ischemic stroke/TIA will be minimized Outcome: Not Progressing   Problem: Coping: Goal: Will identify appropriate support needs Outcome: Not Progressing   Problem: Health Behavior/Discharge Planning: Goal: Ability to manage health-related needs will improve Outcome: Not Progressing   Problem: Nutrition: Goal: Risk of aspiration will decrease Outcome: Not Progressing

## 2022-10-30 NOTE — Inpatient Diabetes Management (Signed)
Inpatient Diabetes Program Recommendations  AACE/ADA: New Consensus Statement on Inpatient Glycemic Control (2015)  Target Ranges:  Prepandial:   less than 140 mg/dL      Peak postprandial:   less than 180 mg/dL (1-2 hours)      Critically ill patients:  140 - 180 mg/dL   Lab Results  Component Value Date   GLUCAP 159 (H) 10/30/2022   HGBA1C 5.8 (H) 10/03/2022    Review of Glycemic Control  Latest Reference Range & Units 10/29/22 16:37 10/29/22 19:56 10/29/22 23:59 10/30/22 03:58 10/30/22 04:22 10/30/22 08:31  Glucose-Capillary 70 - 99 mg/dL 952 (H) 89 841 (H) 68 (L) 109 (H) 159 (H)   Diabetes history: None Outpatient Diabetes medications:  Farxiga 10 mg daily  Current orders for Inpatient glycemic control:  Novolog 0-20 units q 4 hours Jevity 275 ml 6 times a day  Inpatient Diabetes Program Recommendations:    May consider changing Tube feed boluses to 4 times a day (0800, 1200, 1600, and 2000) and then change Novolog correction to (0-15 units) at 0800, 1200, 1600, and 2000 if possible?    Thanks,  Beryl Meager, RN, BC-ADM Inpatient Diabetes Coordinator Pager [phone]  (8a-5p)

## 2022-10-30 NOTE — Significant Event (Signed)
Patient is off unit with transport for swallowing studies in Radiology.

## 2022-10-30 NOTE — Plan of Care (Signed)
  Problem: Nutrition: Goal: Risk of aspiration will decrease Outcome: Progressing   Problem: Nutrition: Goal: Adequate nutrition will be maintained Outcome: Progressing   Problem: Education: Goal: Knowledge of disease or condition will improve Outcome: Not Progressing Goal: Knowledge of secondary prevention will improve (MUST DOCUMENT ALL) Outcome: Not Progressing Goal: Knowledge of patient specific risk factors will improve [name] N/A or DELETE if not current risk factor) Outcome: Not Progressing   Problem: Health Behavior/Discharge Planning: Goal: Ability to manage health-related needs will improve Outcome: Not Progressing   Problem: Self-Care: Goal: Ability to participate in self-care as condition permits will improve Outcome: Not Progressing Goal: Ability to communicate needs accurately will improve Outcome: Not Progressing   Problem: Activity: Goal: Risk for activity intolerance will decrease Outcome: Not Progressing

## 2022-10-30 NOTE — Progress Notes (Signed)
  Progress Note   Patient: [name] EGB:[id number] DOB: July 19, 1960 DOA: 10/02/2022     28 DOS: the patient was seen and examined on 10/30/2022 at 8:55AM      Brief hospital course: Mr. Gilchrest is a 62 y.o. 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  See summary from 7/15        Assessment and Plan: * Stroke (cerebrum) (HCC) Intraparenchymal hematoma - Hold ELiquis    Bacteremia due to Gram-positive bacteria Completed 2 weeks IV therapy   Pseudoaneurysm of left ventricle of heart - Hold Eliquis  Acute on chronic systolic CHF (congestive heart failure) (HCC) BP soft today - Hold Entresto, BB, Lasix - Continue spironolactone today  - Gentle fluids   Essential hypertension BP low - Continue metoprolol, spironolactone - Hold Entresto, Coreg, Lasix  Dyslipidemia - Continue Lipitor          Subjective: Patient has no complaints.  His headache is better, he has no new weakness, he has no fever or respiratory symptoms.  Nursing have no concerns other than his low blood pressure.     Physical Exam: BP (!) 92/58 (BP Location: Left Arm)   Pulse 85   Temp 98.4 F (36.9 C) (Oral)   Resp 20   Ht 6\' 1"  (1.854 m)   Wt 98.8 kg   SpO2 97%   BMI 28.74 kg/m   Adult male, lying in bed, tired but watching television, says he is watching [employer] RRR, no murmurs, no peripheral edema Respiratory rate normal, lungs clear without rales or wheezes Right side is 1/5 in the upper extremity, flaccid in the right leg, left side appears to have 4/5 strength in upper and lower extremity bilaterally.  Heavily dysarthric.  Makes eye contact, responds appropriate to questions, alert    Data Reviewed: Basic metabolic panel and CBC normal  Family Communication: Significant other [name] by phone    Disposition: Status  is: Inpatient         Author: Alberteen Sam, MD 10/30/2022 2:43 PM  For on call review [email or web address].

## 2022-10-30 NOTE — Progress Notes (Signed)
STROKE TEAM PROGRESS NOTE   INTERVAL HISTORY Patient seen and evaluated this morning, his therapists are at the bedside.  Eliquis is on hold due to new increase hemorrhage on CT scan yesterday.  Patient remains severely dysarthric with difficult to understand speech.  He complains of mild headache.  His neurological exam appears stable.  Blood pressure adequately controlled.   Vitals:   10/30/22 0500 10/30/22 0830 10/30/22 1110 10/30/22 1530  BP:  95/71 (!) 92/58 104/74  Pulse:  95 85 87  Resp:  20 20   Temp:  97.6 F (36.4 C) 98.4 F (36.9 C) 97.9 F (36.6 C)  TempSrc:  Oral Oral Oral  SpO2:  96% 97% 97%  Weight: 98.8 kg     Height:       CBC:  Recent Labs  Lab 10/28/22 0354 10/30/22 0605  WBC 9.7 7.9  HGB 13.1 13.1  HCT 39.2 39.1  MCV 89.7 90.3  PLT 141* 125*   Basic Metabolic Panel:  Recent Labs  Lab 10/28/22 0354 10/30/22 0605  NA 137 135  K 4.0 4.0  CL 103 101  CO2 25 25  GLUCOSE 121* 196*  BUN 48* 39*  CREATININE 1.08 0.92  CALCIUM 8.9 8.7*    IMAGING past 24 hours DG Swallowing Func-Speech Pathology  Result Date: 10/30/2022 Table formatting from the original result was not included. Modified Barium Swallow Study Patient Details Name: ARLON BLEIER MRN: [id number] Date of Birth: 27-May-1960 Today's Date: 10/30/2022 HPI/PMH: HPI: Pt is a 62 y/o male who presented 6/19 with L weakness and numbness. Pt received TNK then developed new symptoms of aphasia and R weakness. He underwent mechanical thrombectomy L M3 segment MCA 6/19. Pt noted to have small hemorrhage and follow up CT showed progression of hemorrhage 3cm then 7cm and small acute R cerebellar infarct. PEG placed 7/10. PMH: CAD s/p PCI w/stent and 1v CABG, multiple CGAs, systolic HF s/p ICD, HLD, HTN. Clinical Impression: Clinical Impression: Pt seen today for a third MBS following suspected improvements in swallowing function noted at bedside. Pt with improving ability to follow commands to initiate a second  swallow. Overall, pt presents with a mild oropharyngeal dysphagia characterized by R CN VII and CN XII weakness. Compared to prior MBS, oral and pharyngeal residue has significantly improved, although is not completely resolved specifically with solid textures. He successfully cleared all residue with cued subsequent swallows and sips of thin liquids. No penetration or aspiration observed with trials of nectar thick liquids, honey thick liquids, purees, or solids. Challenging consecutive sips of thin liquids via straw following a large solid bolus resulted in penetration to the level of the cords (PAS 3) before the swallow with immediate sensation and a strong cough to clear material from airway independently. Recommend initiating a diet of Dys 2 textures and thin liquids only with full supervision. Following bites with sips will likely be helpful to clear any residue. Meds can be adminsitered through G tube, although could likely be crushed in puree PRN. Factors that may increase risk of adverse event in presence of aspiration [name] & [name] 2021): Factors that may increase risk of adverse event in presence of aspiration [name] & [name] 2021): Limited mobility; Inadequate oral hygiene; Dependence for feeding and/or oral hygiene; Reduced cognitive function Recommendations/Plan: Swallowing Evaluation Recommendations Swallowing Evaluation Recommendations Recommendations: PO diet PO Diet Recommendation: Thin liquids (Level 0); Dysphagia 2 (Finely chopped) Liquid Administration via: Straw; Cup Medication Administration: Via alternative means Supervision: Full supervision/cueing for swallowing strategies; Staff  to assist with self-feeding Swallowing strategies  : Slow rate; Small bites/sips; Check for anterior loss; Check for pocketing or oral holding Postural changes: Position pt fully upright for meals; Stay upright 30-60 min after meals Oral care recommendations: Oral care BID (2x/day) Caregiver Recommendations:  Have oral suction available Treatment Plan Treatment Plan Treatment recommendations: Therapy as outlined in treatment plan below Follow-up recommendations: Acute inpatient rehab (3 hours/day) Functional status assessment: Patient has had a recent decline in their functional status and demonstrates the ability to make significant improvements in function in a reasonable and predictable amount of time. Treatment frequency: Min 2x/week Treatment duration: 1 week Interventions: Aspiration precaution training; Compensatory techniques; Patient/family education; Trials of upgraded texture/liquids; Diet toleration management by SLP Recommendations Recommendations for follow up therapy are one component of a multi-disciplinary discharge planning process, led by the attending physician.  Recommendations may be updated based on patient status, additional functional criteria and insurance authorization. Assessment: Orofacial Exam: Orofacial Exam Oral Cavity: Oral Hygiene: WFL Oral Cavity - Dentition: Edentulous Orofacial Anatomy: WFL Oral Motor/Sensory Function: Suspected cranial nerve impairment CN V - Trigeminal: Right motor impairment; Right sensory impairment CN VII - Facial: Right motor impairment CN IX - Glossopharyngeal, CN X - Vagus: Right motor impairment CN XII - Hypoglossal: Right motor impairment Anatomy: Anatomy: Suspected cervical osteophytes Boluses Administered: Boluses Administered Boluses Administered: Thin liquids (Level 0); Mildly thick liquids (Level 2, nectar thick); Moderately thick liquids (Level 3, honey thick); Puree  Oral Impairment Domain: Oral Impairment Domain Lip Closure: Escape progressing to mid-chin Tongue control during bolus hold: Not tested Bolus preparation/mastication: Slow prolonged chewing/mashing with complete recollection Bolus transport/lingual motion: Brisk tongue motion Oral residue: Residue collection on oral structures Location of oral residue : Tongue; Palate Initiation of  pharyngeal swallow : Pyriform sinuses  Pharyngeal Impairment Domain: Pharyngeal Impairment Domain Soft palate elevation: No bolus between soft palate (SP)/pharyngeal wall (PW) Laryngeal elevation: Complete superior movement of thyroid cartilage with complete approximation of arytenoids to epiglottic petiole Anterior hyoid excursion: Partial anterior movement Epiglottic movement: Complete inversion Laryngeal vestibule closure: Incomplete, narrow column air/contrast in laryngeal vestibule Pharyngeal stripping wave : Present - complete Pharyngeal contraction (A/P view only): N/A Pharyngoesophageal segment opening: Partial distention/partial duration, partial obstruction of flow Tongue base retraction: Trace column of contrast or air between tongue base and PPW Pharyngeal residue: Collection of residue within or on pharyngeal structures Location of pharyngeal residue: Tongue base; Pyriform sinuses; Valleculae  Esophageal Impairment Domain: Esophageal Impairment Domain Esophageal clearance upright position: -- (N/A) Pill: Pill Consistency administered: -- (N/A) Penetration/Aspiration Scale Score: Penetration/Aspiration Scale Score 1.  Material does not enter airway: Mildly thick liquids (Level 2, nectar thick); Moderately thick liquids (Level 3, honey thick); Puree; Solid 4.  Material enters airway, CONTACTS cords then ejected out: Thin liquids (Level 0) Compensatory Strategies: Compensatory Strategies Compensatory strategies: Yes Straw: Effective Effective Straw: Thin liquid (Level 0)   General Information: Caregiver present: No  Diet Prior to this Study: NPO; G-tube   Temperature : Normal   Respiratory Status: WFL   Supplemental O2: None (Room air)   History of Recent Intubation: No  Behavior/Cognition: Alert; Cooperative; Pleasant mood; Requires cueing Self-Feeding Abilities: Needs assist with self-feeding Baseline vocal quality/speech: Not observed Volitional Cough: Able to elicit Volitional Swallow: Able to elicit  Exam Limitations: Poor positioning Goal Planning: Prognosis for improved oropharyngeal function: Good Barriers to Reach Goals: Cognitive deficits; Language deficits No data recorded Patient/Family Stated Goal: pt eager to eat and drink Consulted and agree with results  and recommendations: Patient Pain: Pain Assessment Pain Assessment: Faces Faces Pain Scale: 0 Pain Location: back Pain Descriptors / Indicators: Grimacing; Guarding Pain Intervention(s): Monitored during session; Repositioned End of Session: Start Time:SLP Start Time (ACUTE ONLY): 1010 Stop Time: SLP Stop Time (ACUTE ONLY): 1038 Time Calculation:SLP Time Calculation (min) (ACUTE ONLY): 28 min Charges: SLP Evaluations $ SLP Speech Visit: 1 Visit SLP Evaluations $MBS Swallow: 1 Procedure $Swallowing Treatment: 1 Procedure $Speech Treatment for Individual: 1 Procedure SLP visit diagnosis: SLP Visit Diagnosis: Dysphagia, oropharyngeal phase (R13.12) Past Medical History: Past Medical History: Diagnosis Date  Abnormal EKG   hx of ischemia showing up on ekg's  Acute myopericarditis   a. 03/2013: readm with hypoxia, tachycardia, elevated ESR/CRP, elevated troponin with Dressler syndrome and myopericarditis; treated with steroids.  Acute respiratory failure (HCC)   a. 01/2013: VDRF. b. 03/2013: hypoxia requiring supp O2 during adm, resolved by discharge.  C. difficile colitis   a. 01/2013 during prolonged adm. b. Recurred 03/2013.  Cardiac tamponade   a. 01/2013 s/p drain.  Cataracts, bilateral   Coronary artery disease   a. s/p MI in 2009 in [place] with stenting of the LCX and LAD;  b. 12/2012 NSTEMI/CAD: LM nl, LAD patent prox stent, LCX 50-70 isr (FFR 0.84), RCA dom, 170m, EF 40-45%-->Med Rx. c. 01/2013: anterolateral STEMI complicated by pericardial effusion (presumed purulent pericarditis) and tamponade s/p drain, ruptured LV pseudoaneurysm s/p CorMatrix patch, CABGx1 (SVG-OM1), fever, CVA, VDRF, C diff.  CVA (cerebral infarction)   a. 01/2013 in setting of  prolonged hospitalization - residual L arm weakness.  Dressler syndrome (HCC)   a. 03/2013: readm with hypoxia, tachycardia, elevated ESR/CRP, elevated troponin with Dressler syndrome and myopericarditis; treated with steroids.  Glaucoma   Hyperlipidemia   Hypokalemia   Hyponatremia   a. During late 2014.  Ischemic cardiomyopathy   a. Sept/Oct 2014: EF ~40% (ICM). b. 03/2013: EF 25-30%. Off ACEI due to hypotension (MIXED NICM/ICM).  Optic neuropathy, ischemic   Pericardial effusion   a. 01/2013: pericardial effusion (presumed purulent pericarditis) and cardiac tamponade s/p drain. b. Persistent moderate pericardial effusion 03/2013.  Pre-diabetes   Pseudoaneurysm of left ventricle of heart   a. 01/2013:  Ruptured inferoposterior LV pseudoaneurysm s/p CorMatrix patch.  Sinus bradycardia   asymptomatic  Stroke [facility]) 2009  weak lt side-lt arm  Tobacco abuse   Wears dentures   top Past Surgical History: Past Surgical History: Procedure Laterality Date  CARDIAC CATHETERIZATION  01/06/2013  CATARACT EXTRACTION Bilateral 05/2021  COLONOSCOPY WITH PROPOFOL N/A 07/03/2016  Procedure: COLONOSCOPY WITH PROPOFOL;  Surgeon: Ruffin Frederick, MD;  Location: [facility];  Service: Gastroenterology;  Laterality: N/A;  CORONARY ANGIOPLASTY WITH STENT PLACEMENT  2000's X 2  "1 + 1" (01/06/2013)  CORONARY ARTERY BYPASS GRAFT N/A 01/28/2013  Procedure: CORONARY ARTERY BYPASS GRAFTING (CABG);  Surgeon: Loreli Slot, MD;  Location: [facility] OR;  Service: Open Heart Surgery;  Laterality: N/A;  CABG x one, using left greater saphenous vein harvested endoscopically  ICD IMPLANT N/A 05/25/2020  Procedure: ICD IMPLANT;  Surgeon: Hillis Range, MD;  Location: [facility] INVASIVE CV LAB;  Service: Cardiovascular;  Laterality: N/A;  INTRAOPERATIVE TRANSESOPHAGEAL ECHOCARDIOGRAM N/A 01/28/2013  Procedure: INTRAOPERATIVE TRANSESOPHAGEAL ECHOCARDIOGRAM;  Surgeon: Loreli Slot, MD;  Location: [facility] OR;  Service: Open Heart Surgery;  Laterality:  N/A;  IR CT HEAD LTD  10/03/2022  IR PERCUTANEOUS ART THROMBECTOMY/INFUSION INTRACRANIAL INC DIAG ANGIO  10/03/2022  IR US GUIDE VASC ACCESS RIGHT  10/03/2022  LAPAROSCOPY N/A 10/24/2022  Procedure:  LAPAROSCOPY ASSISTED GASTROSTOMY (PEG) PLACEMENT;  Surgeon: Diamantina Monks, MD;  Location: [facility] OR;  Service: General;  Laterality: N/A;  LEFT HEART CATH AND CORS/GRAFTS ANGIOGRAPHY N/A 09/13/2020  Procedure: LEFT HEART CATH AND CORS/GRAFTS ANGIOGRAPHY;  Surgeon: Swaziland, Peter M, MD;  Location: [facility] INVASIVE CV LAB;  Service: Cardiovascular;  Laterality: N/A;  LEFT HEART CATHETERIZATION WITH CORONARY ANGIOGRAM N/A 01/06/2013  Procedure: LEFT HEART CATHETERIZATION WITH CORONARY ANGIOGRAM;  Surgeon: Peter M Swaziland, MD;  Location: [facility] CATH LAB;  Service: Cardiovascular;  Laterality: N/A;  LEFT HEART CATHETERIZATION WITH CORONARY ANGIOGRAM N/A 01/21/2013  Procedure: LEFT HEART CATHETERIZATION WITH CORONARY ANGIOGRAM;  Surgeon: Kathleene Hazel, MD;  Location: [facility] CATH LAB;  Service: Cardiovascular;  Laterality: N/A;  LUMBAR LAMINECTOMY/ DECOMPRESSION WITH MET-RX Right 06/30/2018  Procedure: Right minimally invasive Lumbar Three-Four Far lateral discectomy;  Surgeon: Jadene Pierini, MD;  Location: [facility] OR;  Service: Neurosurgery;  Laterality: Right;  Right minimally invasive Lumbar Three-Four Far lateral discectomy  MASS EXCISION Right 07/21/2014  Procedure: EXCISION RIGHT NECK MASS;  Surgeon: Harriette Bouillon, MD;  Location: [facility];  Service: General;  Laterality: Right;  PEG PLACEMENT N/A 10/24/2022  Procedure: ATTEMPTED PERCUTANEOUS ENDOSCOPIC GASTROSTOMY (PEG) PLACEMENT;  Surgeon: Diamantina Monks, MD;  Location: [facility] OR;  Service: General;  Laterality: N/A;  PERICARDIAL TAP N/A 01/24/2013  Procedure: PERICARDIAL TAP;  Surgeon: Micheline Chapman, MD;  Location: [facility] CATH LAB;  Service: Cardiovascular;  Laterality: N/A;  RADIOLOGY WITH ANESTHESIA N/A 10/03/2022  Procedure: IR WITH ANESTHESIA;  Surgeon:  Julieanne Cotton, MD;  Location: [facility] OR;  Service: Radiology;  Laterality: N/A;  RIGHT HEART CATHETERIZATION Right 01/24/2013  Procedure: RIGHT HEART CATH;  Surgeon: Micheline Chapman, MD;  Location: [facility] CATH LAB;  Service: Cardiovascular;  Laterality: Right;  TEE WITHOUT CARDIOVERSION N/A 10/29/2022  Procedure: TRANSESOPHAGEAL ECHOCARDIOGRAM;  Surgeon: [name], MD;  Location: [facility] INVASIVE CV LAB;  Service: Cardiovascular;  Laterality: N/A;  VENTRICULAR ANEURYSM RESECTION N/A 01/28/2013  Procedure: LEFT VENTRICULAR ANEURYSM REPAIR;  Surgeon: Loreli Slot, MD;  Location: [facility] OR;  Service: Open Heart Surgery;  Laterality: N/A; [name], M.A., CF-SLP Speech Language Pathology, Acute Rehabilitation Services Secure Chat preferred [phone] 10/30/2022, 11:30 AM    Physical Exam  Constitutional: Appears well-developed and well-nourished.  Middle-aged African-American male Cardiovascular: Normal rate and regular rhythm.  Respiratory: Regular, unlabored respirations on supplemental O2 Skin: WDI  Neuro: Mental Status: Patient is awake, alert, speech is severely dysarthric with expressive aphasia.  Able to speak a few words and short sentences but difficult to understand.Following commands briskly on the left side Cranial Nerves: II: Pupils are equal, round, and reactive to light.   III,IV, VI: EOMI without ptosis or diplopia.  VII: Mild right facial droop  VIII: Hearing is intact to voice XII: tongue is midline without atrophy or fasciculations.  Motor: Tone is normal. Bulk is normal.  Moves left upper and lower extremities to command, will does not move right upper extremity but moves right lower extremity to command     ASSESSMENT/PLAN [name] is a 62 y.o. male with history of CAD s/p PCI w/ stent to LCS and LAD, s/p 1v CABG, multiple CVAs (R cerebellar 04/2022),  systolic HF s/p ICD, HLD, HTN presenting with slurred speech and L sided weakness now s/p TNK and IR  thrombectomy of L MCA (M3 segment) c/b  hemorrhage.   TIA - Right brain stroke aborted s/p TNK and later that day second stroke with left  M2 occlusion s/p IR Stroke - Left MCA and right cerebellar infarcts left M3 occlusion s/p IR with TICIs, complicated by L frontoparietal ICH and SAH, etiology likely cardioembolic source  CT 6/18 no acute finding, but the bilateral frontal, parietal, occipital and cerebellum infarcts CTA head and neck unremarkable CTA head and neck repeat new short segment occlusion of distal left M2 S/p IR with left M3 TICIs reperfusion  post IR CT Hyperdensity within the left sylvian fissure may represent small subarachnoid hemorrhage versus contrast. CT Head: Progressed hemorrhage in L frontoparietal region w/ 3cm hematoma and regional SAH MRI  progressive L fronto parietal hematoma (~7cm), regional SAH, increased mass effect.  Likely underlying infarct. Sm R cerebellar infarct, acute. Numerous chornic cerebral and cerebellar infarcts Repeat CT 6/19 increase in size of the left frontal/parietal hematoma. New extension into the left lateral ventricle. CT repeat 6/22 x 2 - New component of intraparenchymal hemorrhage in the posterior right hemisphere that measures approximately 2.5 x 1.4 cm. (On my review, more like right brain redistribution instead of new hemorrhage)  Repeat CT 6/23: Unchanged appearance of bilateral mixed intraparenchymal and subarachnoid hemorrhage. Trace rightward midline shift is unchanged. No hydrocephalus CT repeat 6/25 stable hemorrhage, unchanged CT Head 6/28-  stable hematoma. SAH and IVH have regressed since 10/03/2022.  CT Head 7/14 with new hemorrhage in the previously noted areas of hemorrhage, bilateral occipital horns with slightly increase size of the ventricles concerning of hydrocephalus  Echo: Large inferior pseudoaneurysm with a depth of 3 cm and a maximum width of roughly 5 cm the wall of the pseudoaneurysm is made above the layer of  thrombus that is roughly 1.5 cm thick. EF 30 to 35%, severe hypokinesis of left ventricle. No afib with ICD interrogation LE venous doppler neg for DVT TCD bubble study showed moderate sized PFO LDL 63 HgbA1c 5.8 UDS neg VTE prophylaxis - SQH  aspirin 81 mg daily and clopidogrel 75 mg daily prior to admission, was on Eliquis but due to new area will have to hold Eliquis for a couple days, or until repeat CT is stable Therapy recommendations:  SNF Disposition:  pending  CHF s/p ICD Systolic HF, s/p ICD 2/2 ICM.  Echo EF 30-35%  Trop 45 -> 43 -> 39 -> 35 CK 3714, CKMB 3.5 CXR 6/25 central vascular congestion and developing interstitial edema.  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  Cardiology on board, appreciate assistance  CTA of the heart intact chronic patch repair to inferior and inferior septal pseudoaneurysm, large burden of thrombus on top of patch repair, severe biventricular failure with D-shaped septum, occluded SVG to OM Eliquis held due to new hemorrhage  Ultimately patient will need to repeat undergo repeat pseudoaneurysm repair if he becomes healthy enough to tolerate surgery  Hyperlipidemia Home meds:  atorvastatin 80 LDL 63, goal < 70 Lipitor 80 resumed    Continue statin at discharge  Tobacco abuse Current  smoker Smoking cessation counseling will be  provided Nicotine patch provided  Fever ? Aspiration pneumonia Tmax 101.6 -> 99.8-> afebrile->100.8 -> 102-> 101.7-> 101.1 -> 101-> 100.3-> 99.4 WBC 7.6->9.4->10.2->9.8->10.2-> 9.2 UA negative CXR central vascular congestion and developing interstitial edema.  CXR 6/26 similar appearance of atelectasis at both lung bases Procalc negative.  Blood culture 2/4 bottles staph epidermidis, ID consulted, appreciate recommendations Blood culture repeat pending -no growth x 3 days NT suction PRN Ceftriaxone -> ancef ID consulted EP on board - not candidate for ICD extraction at this time Cardiology felt pt not candidate for TEE now given respiratory distress, however they are willing to do it if he is intubated  Dysphagia  Vomiting on zofran PRN, improved Pt ate well 6/24, nocturnal TF d/c'ed -> fever with possible aspiration -> NPO and TF restarted on 6/25 6/27 having difficulty coordinating swallowing and respirations  SLP will need to re evaluate  PFO Known PFO in the past TCD bubble study showed a moderate-sized PFO LE venous Doppler negative for DVT  Not an emergent issue at this time  Other stroke risk factors Obesity with BMI 32.23 CAD  Other Active Problems Thrombocytopenia, platelet 110->89-> 172-> 190-> stable CK 436 -> 3714 -> 610-> 490  Hospital day # 28    Patient neurological exam remains stable and headache appears to have improved however repeat CT 10/28/22 showed diffuse increased areas of hemorrhage hence Eliquis is currently on hold.  Long discussion at the bedside with the patient's significant other as well as son Kekoa about as being stuck between a rock and a hard place.  Using anticoagulation to reduce risk of thromboembolism from his left ventricular aneurysm and clot carries risk for bleeding which he has now had twice.  Family unfortunately struggling with decisions about goals of care as patient had not clearly expressed it.  They have met with palliative  care team and agreed to DNR/DNI but continue all available medical interventions to prolong life.  Plan to repeat CT head tomorrow and if hemorrhage is reduced may consider resuming Eliquis. Greater than 50% time during this 25-minute visit was spent in counseling and coordination of care about his stroke and intracerebral hemorrhage and bleeding and stroke risk and answering questions.  Discussed with Dr. Raelyn Number, MD Medical Director [facility] Pager: [phone] 10/30/2022 3:40 PM

## 2022-10-30 NOTE — Progress Notes (Signed)
PT Cancellation Note  Patient Details Name: [name] MRN: [id number] DOB: [birth year]   Cancelled Treatment:    Reason Eval/Treat Not Completed: Patient at procedure or test/unavailable. Have attempted x 3 this AM. Pt down for test, needed to be cleaned up and now back out for test.    Angelina Ok [facility] 10/30/2022, 11:56 AM Skip Mayer PT Acute Rehabilitation Services Office [phone]

## 2022-10-30 NOTE — Progress Notes (Signed)
Modified Barium Swallow Study  Patient Details  Name: [name] MRN: [id number] Date of Birth: July 06, 1960  Today's Date: 10/30/2022  Modified Barium Swallow completed.  Full report located under Chart Review in the Imaging Section.  History of Present Illness Pt is a 62 y/o male who presented 6/19 with L weakness and numbness. Pt received TNK then developed new symptoms of aphasia and R weakness. He underwent mechanical thrombectomy L M3 segment MCA 6/19. Pt noted to have small hemorrhage and follow up CT showed progression of hemorrhage 3cm then 7cm and small acute R cerebellar infarct. PEG placed 7/10. PMH: CAD s/p PCI w/stent and 1v CABG, multiple CGAs, systolic HF s/p ICD, HLD, HTN.   Clinical Impression Pt seen today for a third MBS following suspected improvements in swallowing function noted at bedside. Pt with improving ability to follow commands to initiate a second swallow. Overall, pt presents with a mild oropharyngeal dysphagia characterized by R CN VII and CN XII weakness. Compared to prior MBS, oral and pharyngeal residue has significantly improved, although is not completely resolved specifically with solid textures. He successfully cleared all residue with cued subsequent swallows and sips of thin liquids. No penetration or aspiration observed with trials of nectar thick liquids, honey thick liquids, purees, or solids. Challenging consecutive sips of thin liquids via straw following a large solid bolus resulted in penetration to the level of the cords (PAS 3) before the swallow with immediate sensation and a strong cough to clear material from airway independently. Recommend initiating a diet of Dys 2 textures and thin liquids only with full supervision. Following bites with sips will likely be helpful to clear any residue. Meds can be adminsitered through G tube, although could likely be crushed in puree PRN. Factors that may increase risk of adverse event in presence of  aspiration [name] [employer] [name] 2021): Limited mobility;Inadequate oral hygiene;Dependence for feeding and/or oral hygiene;Reduced cognitive function  Swallow Evaluation Recommendations Recommendations: PO diet PO Diet Recommendation: Thin liquids (Level 0);Dysphagia 2 (Finely chopped) Liquid Administration via: Straw;Cup Medication Administration: Via alternative means Supervision: Full supervision/cueing for swallowing strategies;Staff to assist with self-feeding Swallowing strategies  : Slow rate;Small bites/sips;Check for anterior loss;Check for pocketing or oral holding Postural changes: Position pt fully upright for meals;Stay upright 30-60 min after meals Oral care recommendations: Oral care BID (2x/day) Caregiver Recommendations: Have oral suction available      [name], M.A., CF-SLP Speech Language Pathology, Acute Rehabilitation Services  Secure Chat preferred [phone]  10/30/2022,11:27 AM

## 2022-10-30 NOTE — Progress Notes (Signed)
Nutrition Follow-up  DOCUMENTATION CODES:  Not applicable  INTERVENTION:  Continue current diet as ordered per SLP Adjust bolus regimen to aid in CBG management: Jevity 1.5 4x/d (7 cartons total each day) Free water 30mL free water before and after each bolus This provides 2485kcal, 106g protein, and of free water ( total free water) Banatrol BID  NUTRITION DIAGNOSIS:  Inadequate oral intake related to inability to eat as evidenced by NPO status. Ongoing.   GOAL:  Patient will meet greater than or equal to 90% of their needs Met with TF at goal  MONITOR:  Diet advancement, Labs, Weight trends, TF tolerance, I & O's  REASON FOR ASSESSMENT:  Consult Enteral/tube feeding initiation and management  ASSESSMENT:  62 y.o. male presented to the ED with L side weakness, slurred speech, slow speech, and feeling dizzy. PMH includes CAD, CHF, HLD, and HTN. Pt admitted for stroke.  6/18 - Admitted; s/p TNK 6/19 - s/p thrombectomy of L M3; failed bedside swallow; Cortrak placed; tip distal stomach 7/10 - PEG placed in OR via EGD by surgery team 7/16 - MBS, DYS2, thin liquids  Pt with low CBG overnight. Discussed with DM coordinator and recommends adjusting bolus feeds to 4x/d if possible as CBG seem to be getting low with late night insulin with feeds. Increased amounts at each feed to decrease number of boluses per day. Pt medically stable for dc, waiting on placement. Weight stable but noted several loose BM. Added banatrol back to regimen.   Of note, pt able to have diet advanced today. Will monitor intake and adjust bolus feeding amount as needed to account for PO intake.  Nutritionally Relevant Medications: Scheduled Meds:  atorvastatin  80 mg Per Tube Daily   famotidine  20 mg Per Tube BID   feeding supplement (JEVITY 1.5 CAL/FIBER)  275 mL Per Tube 6 X Daily   free water  60 mL Per Tube 6 X Daily   insulin aspart  0-20 Units Subcutaneous Q4H   multivitamin  with minerals  1 tablet Per Tube Daily   polyethylene glycol  17 g Per Tube BID   spironolactone  12.5 mg Per Tube Daily   PRN Meds: ondansetron  Labs Reviewed: BUN 39 CBG ranges from 68-159 mg/dL over the last 24 hours  Admission weight: 108.6 kg Current weight: 98.8 kg  Diet Order:   Diet Order             DIET DYS 2 Room service appropriate? No; Fluid consistency: Thin  Diet effective now                   EDUCATION NEEDS:  Not appropriate for education at this time  Skin:  Skin Assessment: Reviewed RN Assessment  Last BM:  7/16 - type 7  Height:  Ht Readings from Last 1 Encounters:  10/03/22 6\' 1"  (1.854 m)    Weight:  Wt Readings from Last 1 Encounters:  10/30/22 98.8 kg    Ideal Body Weight:  83.6 kg  BMI:  Body mass index is 28.74 kg/m.  Estimated Nutritional Needs:  Kcal:  2200-2400 kcal/d Protein:  105-125 g/d Fluid:  >/= 2 L    Greig Castilla, RD, LDN Clinical Dietitian RD pager # available in [facility]  After hours/weekend pager # available in [facility]

## 2022-10-31 ENCOUNTER — Other Ambulatory Visit: Payer: Self-pay | Admitting: Cardiology

## 2022-10-31 DIAGNOSIS — I63413 Cerebral infarction due to embolism of bilateral middle cerebral arteries: Secondary | ICD-10-CM | POA: Diagnosis not present

## 2022-10-31 DIAGNOSIS — R1312 Dysphagia, oropharyngeal phase: Secondary | ICD-10-CM | POA: Diagnosis not present

## 2022-10-31 DIAGNOSIS — I255 Ischemic cardiomyopathy: Secondary | ICD-10-CM

## 2022-10-31 DIAGNOSIS — Z66 Do not resuscitate: Secondary | ICD-10-CM | POA: Diagnosis not present

## 2022-10-31 DIAGNOSIS — I6300 Cerebral infarction due to thrombosis of unspecified precerebral artery: Secondary | ICD-10-CM | POA: Diagnosis not present

## 2022-10-31 DIAGNOSIS — I639 Cerebral infarction, unspecified: Secondary | ICD-10-CM | POA: Diagnosis not present

## 2022-10-31 LAB — GLUCOSE, CAPILLARY
Glucose-Capillary: 103 mg/dL — ABNORMAL HIGH (ref 70–99)
Glucose-Capillary: 99 mg/dL (ref 70–99)
Glucose-Capillary: 162 mg/dL — ABNORMAL HIGH (ref 70–99)
Glucose-Capillary: 159 mg/dL — ABNORMAL HIGH (ref 70–99)
Glucose-Capillary: 178 mg/dL — ABNORMAL HIGH (ref 70–99)

## 2022-10-31 MED ORDER — GABAPENTIN 250 MG/5ML PO SOLN
300.0000 mg | Freq: Every day | ORAL | Status: DC
  Administered 2022-10-31 – 2022-11-15 (×15): 300 mg via ORAL
  Filled 2022-10-31 (×20): qty 6

## 2022-10-31 MED ORDER — ACETAMINOPHEN 160 MG/5ML PO SOLN
650.0000 mg | ORAL | Status: DC | PRN
  Administered 2022-11-01 – 2022-11-06 (×13): 650 mg via ORAL
  Filled 2022-10-31 (×13): qty 20.3

## 2022-10-31 MED ORDER — ACETAMINOPHEN 650 MG RE SUPP: 650.0000 mg | RECTAL | Status: DC | PRN

## 2022-10-31 MED ORDER — APIXABAN 5 MG PO TABS
5.0000 mg | ORAL_TABLET | Freq: Two times a day (BID) | ORAL | Status: DC
  Administered 2022-11-01 – 2022-11-16 (×31): 5 mg via ORAL
  Filled 2022-10-31 (×31): qty 1

## 2022-10-31 MED ORDER — ACETAMINOPHEN 325 MG PO TABS
650.0000 mg | ORAL_TABLET | ORAL | Status: DC | PRN
  Administered 2022-11-02 – 2022-11-15 (×12): 650 mg via ORAL
  Filled 2022-10-31 (×12): qty 2

## 2022-10-31 MED ORDER — POLYETHYLENE GLYCOL 3350 17 G PO PACK
17.0000 g | PACK | Freq: Two times a day (BID) | ORAL | Status: DC
  Administered 2022-11-06 – 2022-11-16 (×19): 17 g via ORAL
  Filled 2022-10-31 (×20): qty 1

## 2022-10-31 NOTE — TOC Progression Note (Signed)
Transition of Care [facility]) - Progression Note    Patient Details  Name: [name] MRN: [id number] Date of Birth: 07/23/1960  Transition of Care [facility]) CM/SW Contact  Baldemar Lenis, [place] Phone Number: 10/31/2022, 3:15 PM  Clinical Narrative:   CSW attempted to reach son to discuss SNF options, left a voicemail. Awaiting call back.    Expected Discharge Plan: Skilled Nursing Facility Barriers to Discharge: Continued Medical Work up, English as a second language teacher, SNF Pending bed offer  Expected Discharge Plan and Services In-house Referral: Clinical Social Work     Living arrangements for the past 2 months: Apartment                                       Social Determinants of Health (SDOH) Interventions SDOH Screenings   Food Insecurity: Patient Unable To Answer (10/11/2022)  Housing: Patient Unable To Answer (10/11/2022)  Transportation Needs: Patient Unable To Answer (10/11/2022)  Utilities: Patient Unable To Answer (10/11/2022)  Depression (PHQ2-9): Low Risk  (05/08/2022)  Social Connections: Unknown (08/28/2021)   Received from [facility]  Tobacco Use: High Risk (10/29/2022)    Readmission Risk Interventions     No data to display

## 2022-10-31 NOTE — Progress Notes (Signed)
Speech Language Pathology Treatment: Dysphagia;Cognitive-Linquistic  Patient Details Name: [name] MRN: [id number] DOB: 1960-04-22 Today's Date: 10/31/2022 Time: 4098-1191 SLP Time Calculation (min) (ACUTE ONLY): 36 min  Assessment / Plan / Recommendation Clinical Impression  SLP prompted pt to complete oral care prior to PO trials, which he did thoroughly and independently. Pt assessed during trials of thin liquids and purees. His residue continues to improve and he is exhibiting better awareness by clearing any residue independently. A delayed throat clear noted intermittently with consecutive sips of thin liquids, but was resolved by cueing to take small, single sips. His swallowing has potential to continue to improve, but he will require frequent supervision to ensure he is adhering to the use of compensatory strategies. Will f/u to trial more advanced solid POs with dentures in place to assess readiness for upgrade.   Pt showing improvements related to both cognition and language. He has increasing accuracy given binary choices and Min verbal cues. SLP assigned meaning to hands and asked pt to point at desired choice, which he did 1/4 times with Max verbal cueing. Suspect he may be able to use basic picture board now with cueing. He has shown significant improvement independently completing functional activities, such as brushing his teeth, washing his face, and feeding himself given Min verbal and tactile cues. Although much of his speech remains unintelligible, he is showing improvement at verbalizing the correct initial phoneme or the word given Max phonemic cues. Today, he consistently said "more juice" given the initial phoneme of each word during PO trials. When given a choice between water and grape juice, he effectively voiced the "w" sound to indicate his choice. He is showing increased awareness of his unintelligibility and implementing the use of more gestures to convey meaning.  SLP will continue to follow.    HPI HPI: Pt is a 62 y/o male who presented 6/19 with L weakness and numbness. Pt received TNK then developed new symptoms of aphasia and R weakness. He underwent mechanical thrombectomy L M3 segment MCA 6/19. Pt noted to have small hemorrhage and follow up CT showed progression of hemorrhage 3cm then 7cm and small acute R cerebellar infarct. PEG placed 7/10. PMH: CAD s/p PCI w/stent and 1v CABG, multiple CGAs, systolic HF s/p ICD, HLD, HTN.      SLP Plan  Continue with current plan of care      Recommendations for follow up therapy are one component of a multi-disciplinary discharge planning process, led by the attending physician.  Recommendations may be updated based on patient status, additional functional criteria and insurance authorization.    Recommendations  Diet recommendations: Dysphagia 2 (fine chop);Thin liquid Liquids provided via: Cup;Straw Medication Administration: Via alternative means Supervision: Full supervision/cueing for compensatory strategies;Staff to assist with self feeding Compensations: Slow rate;Small sips/bites;Follow solids with liquid Postural Changes and/or Swallow Maneuvers: Seated upright 90 degrees;Upright 30-60 min after meal                  Oral care BID   Frequent or constant Supervision/Assistance Dysphagia, oropharyngeal phase (R13.12);Aphasia (R47.01);Cognitive communication deficit (Y78.295)     Continue with current plan of care     [name], M.A., CF-SLP Speech Language Pathology, Acute Rehabilitation Services  Secure Chat preferred [phone]   10/31/2022, 12:41 PM

## 2022-10-31 NOTE — TOC Progression Note (Signed)
Transition of Care [facility]) - Progression Note    Patient Details  Name: [name] MRN: [id number] Date of Birth: [birth year]  Transition of Care [facility]) CM/SW Contact  Baldemar Lenis, [place] Phone Number: 10/31/2022, 3:15 PM  Clinical Narrative:   CSW attempted to reach son to discuss SNF choice, left a voicemail. Awaiting call back.    Expected Discharge Plan: Skilled Nursing Facility Barriers to Discharge: Continued Medical Work up, English as a second language teacher, SNF Pending bed offer  Expected Discharge Plan and Services In-house Referral: Clinical Social Work     Living arrangements for the past 2 months: Apartment                                       Social Determinants of Health (SDOH) Interventions SDOH Screenings   Food Insecurity: Patient Unable To Answer (10/11/2022)  Housing: Patient Unable To Answer (10/11/2022)  Transportation Needs: Patient Unable To Answer (10/11/2022)  Utilities: Patient Unable To Answer (10/11/2022)  Depression (PHQ2-9): Low Risk  (05/08/2022)  Social Connections: Unknown (08/28/2021)   Received from [facility]  Tobacco Use: High Risk (10/29/2022)    Readmission Risk Interventions     No data to display

## 2022-10-31 NOTE — Progress Notes (Signed)
STROKE TEAM PROGRESS NOTE   INTERVAL HISTORY Patient seen and evaluated this morning, his therapists are at the bedside.  [name] is on hold due to new increase hemorrhage on CT scan .  Follow-up CT scan of the brain yesterday shows decrease in intraventricular blood in the left occipital horn and expected evolution of intraparenchymal and subarachnoid blood in both cerebral and cerebellar hemispheres.  Patient remains severely dysarthric with difficult to understand speech.  He complains of mild headache.  His neurological exam appears stable.  Blood pressure adequately controlled.   Vitals:   10/30/22 2348 10/31/22 0309 10/31/22 0507 10/31/22 0819  BP: 113/70 106/74  113/74  Pulse: 87 84  75  Resp: 18 19    Temp: 97.7 F (36.5 C) 98.2 F (36.8 C)    TempSrc: Oral Oral    SpO2: 100% 100%  97%  Weight:   98.4 kg   Height:       CBC:  Recent Labs  Lab 10/28/22 0354 10/30/22 0605  WBC 9.7 7.9  HGB 13.1 13.1  HCT 39.2 39.1  MCV 89.7 90.3  PLT 141* 125*   Basic Metabolic Panel:  Recent Labs  Lab 10/28/22 0354 10/30/22 0605  NA 137 135  K 4.0 4.0  CL 103 101  CO2 25 25  GLUCOSE 121* 196*  BUN 48* 39*  CREATININE 1.08 0.92  CALCIUM 8.9 8.7*    IMAGING past 24 hours No results found.   Physical Exam  Constitutional: Appears well-developed and well-nourished.  Middle-aged African-American male Cardiovascular: Normal rate and regular rhythm.  Respiratory: Regular, unlabored respirations on supplemental O2 Skin: WDI  Neuro: Mental Status: Patient is awake, alert, speech is severely dysarthric with expressive aphasia.  Able to speak a few words and short sentences but difficult to understand.Following commands briskly on the left side Cranial Nerves: II: Pupils are equal, round, and reactive to light.   III,IV, VI: EOMI without ptosis or diplopia.  VII: Mild right facial droop  VIII: Hearing is intact to voice XII: tongue is midline without atrophy or  fasciculations.  Motor: Tone is normal. Bulk is normal.  Moves left upper and lower extremities to command, will does not move right upper extremity but moves right lower extremity to command     ASSESSMENT/PLAN [name] is a 62 y.o. male with history of CAD s/p PCI w/ stent to LCS and LAD, s/p 1v CABG, multiple CVAs (R cerebellar 04/2022),  systolic HF s/p ICD, HLD, HTN presenting with slurred speech and L sided weakness now s/p TNK and IR thrombectomy of L MCA (M3 segment) c/b  hemorrhage.   TIA - Right brain stroke aborted s/p TNK and later that day second stroke with left M2 occlusion s/p IR Stroke - Left MCA and right cerebellar infarcts left M3 occlusion s/p IR with TICIs, complicated by L frontoparietal ICH and SAH, etiology likely cardioembolic source  CT 6/18 no acute finding, but the bilateral frontal, parietal, occipital and cerebellum infarcts CTA head and neck unremarkable CTA head and neck repeat new short segment occlusion of distal left M2 S/p IR with left M3 TICIs reperfusion  post IR CT Hyperdensity within the left sylvian fissure may represent small subarachnoid hemorrhage versus contrast. CT Head: Progressed hemorrhage in L frontoparietal region w/ 3cm hematoma and regional SAH MRI  progressive L fronto parietal hematoma (~7cm), regional SAH, increased mass effect.  Likely underlying infarct. Sm R cerebellar infarct, acute. Numerous chornic cerebral and cerebellar infarcts Repeat CT 6/19  increase in size of the left frontal/parietal hematoma. New extension into the left lateral ventricle. CT repeat 6/22 x 2 - New component of intraparenchymal hemorrhage in the posterior right hemisphere that measures approximately 2.5 x 1.4 cm. (On my review, more like right brain redistribution instead of new hemorrhage)  Repeat CT 6/23: Unchanged appearance of bilateral mixed intraparenchymal and subarachnoid hemorrhage. Trace rightward midline shift is unchanged. No  hydrocephalus CT repeat 6/25 stable hemorrhage, unchanged CT Head 6/28-  stable hematoma. SAH and IVH have regressed since 10/03/2022.  CT Head 7/14 with new hemorrhage in the previously noted areas of hemorrhage, bilateral occipital horns with slightly increase size of the ventricles concerning of hydrocephalus  Echo: Large inferior pseudoaneurysm with a depth of 3 cm and a maximum width of roughly 5 cm the wall of the pseudoaneurysm is made above the layer of thrombus that is roughly 1.5 cm thick. EF 30 to 35%, severe hypokinesis of left ventricle. No afib with ICD interrogation LE venous doppler neg for DVT TCD bubble study showed moderate sized PFO LDL 63 HgbA1c 5.8 UDS neg VTE prophylaxis - SQH  aspirin 81 mg daily and clopidogrel 75 mg daily prior to admission, was on [name] but due to new area will have to hold [name] for a couple days, or until repeat CT is stable Therapy recommendations:  SNF Disposition:  pending  CHF s/p ICD Systolic HF, s/p ICD 2/2 ICM.  Echo EF 30-35%  Trop 45 -> 43 -> 39 -> 35 CK 3714, CKMB 3.5 CXR 6/25 central vascular congestion and developing interstitial edema.  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  Cardiology on board, appreciate assistance  CTA of the heart intact chronic patch repair to inferior and inferior septal pseudoaneurysm, large  burden of thrombus on top of patch repair, severe biventricular failure with D-shaped septum, occluded SVG to OM [name] held due to new hemorrhage  Ultimately patient will need to repeat undergo repeat pseudoaneurysm repair if he becomes healthy enough to tolerate surgery  Hyperlipidemia Home meds:  atorvastatin 80 LDL 63, goal < 70 Lipitor 80 resumed    Continue statin at discharge  Tobacco abuse Current smoker Smoking cessation counseling will be provided Nicotine patch provided  Fever ? Aspiration pneumonia Tmax 101.6 -> 99.8-> afebrile->100.8 -> 102-> 101.7-> 101.1 -> 101-> 100.3-> 99.4 WBC 7.6->9.4->10.2->9.8->10.2-> 9.2 UA negative CXR central vascular congestion and developing interstitial edema.  CXR 6/26 similar appearance of atelectasis at both lung bases Procalc negative.  Blood culture 2/4 bottles staph epidermidis, ID consulted, appreciate recommendations Blood culture repeat pending -no growth x 3 days NT suction PRN Ceftriaxone -> ancef ID consulted EP on board - not candidate for ICD extraction at this time Cardiology felt pt not candidate for TEE now given respiratory distress, however they are willing to do it if he is intubated  Dysphagia  Vomiting on zofran PRN, improved Pt ate well 6/24, nocturnal TF d/c'ed ->  fever with possible aspiration -> NPO and TF restarted on 6/25 6/27 having difficulty coordinating swallowing and respirations  SLP will need to re evaluate  PFO Known PFO in the past TCD bubble study showed a moderate-sized PFO LE venous Doppler negative for DVT  Not an emergent issue at this time  Other stroke risk factors Obesity with BMI 32.23 CAD  Other Active Problems Thrombocytopenia, platelet 110->89-> 172-> 190-> stable CK 436 -> 3714 -> 610-> 490  Hospital day # 29   Patient neurological exam remains stable and headache appears to have improved however repeat CT 10/28/22 showed diffuse increased areas of hemorrhage hence  [name] is currently on hold.  Follow-up CT scan on 10/30/2022 shows slight improvement in intraventricular blood and expected evolutionary changes in the parenchymal and cerebellar hemorrhages.  Long discussion at the bedside with the patient's significant other as well as son [name] on 10/29/2022 about as being stuck between a rock and a hard place.  Using anticoagulation to reduce risk of thromboembolism from his left ventricular aneurysm and clot carries risk for bleeding which he has now had twice.  Family unfortunately struggling with decisions about goals of care as patient had not clearly expressed it.  They have met with palliative care team and agreed to DNR/DNI but continue all available medical interventions to prolong life.  Plan to  resuming [name] tomorrow.. Greater than 50% time during this 25-minute visit was spent in counseling and coordination of care about his stroke and intracerebral hemorrhage and bleeding and stroke risk and answering questions.  Discussed with Dr. Maryfrances Bunnell.  Stroke team will sign off.  Kindly call for questions  Delia Heady, MD Medical Director [facility] Pager: [phone] 10/31/2022 1:41 PM

## 2022-10-31 NOTE — Progress Notes (Signed)
Progress Note   Patient: [name] BJY:[id number] DOB: 1960/06/23 DOA: 10/02/2022     29 DOS: the patient was seen and examined on 10/31/2022 at 9:09AM      Brief hospital course: [name] is a 62 y.o. 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     Significant events: 6/18: Admitted for stroke, received TNK; developed right sided deficits overnight after TNK, underwent NIR thrombectomy 6/19: Post thrombectomy CT showed left frontal IPH and SAH 6/20: Heparin started cautiously 6/22: CT showed progression of hematomas, heparin stopped 6/23: Repeat echo shows dehiscence of prior pseudoaneurysm patch with adherent LV thrombus; Cardiology consulted 6/26: ID consulted for MSSE bacteremia 6/28: EP consulted, recommended not to take out ICD; CT stabilized, heparin restarted 7/2: Transferred OOU 7/10: Placed PEG tube 7/11: Transitioned to basal bolus feedings and switched to Eliquis 7/13: Increased HA, repeat CT shows progression of ICHs; Eliquis held 7/15: TEE negative, antibiotics stopped    Significant studies: 6/18 CTA head and neck: no LVO 6/18 repeat CTA head and neck: new distal left MCA M2 occlusion 6/19 Echo: poor quality images, nondiagnostic 6/19 TCD: positive study, indicating R>L shunt 6/19 LE doppler: no DVT 6/19 CT head: left frontal parietal IPH and adjacent [name] 6/22 CT head: size now 8x4x4cm, with new 2cm component 6/23 complete echo: now shows recurrence of pseudoaneurysm, dehiscence of patch with adherent thrombus 6/25 coronary CTA: confirms above 6/28 CT head: stable large left frontoparietal hematoma 7/13: CT head shows worsening ICH 7/15 TEE: shows pseudoaneurysm with adherent clot, no valvular vegetations, no signs of vegetation on ICD leads 7/16: CT head shows regression of ICH noted  7/13   Significant microbiology data: 6/18 MRSA nares: Negative 6/25 Blood cultures: MSSE in 2/2 6/27 blood cultures: NG in 2/2    Procedures: 6/18: TNK at 1246 6/19: Thrombectomy by Dr. Tommie Tucker 6/19: Cortrak placed 7/10: PEG placed 7/15: TEE    Consults: Cardiology Infectious disease Electrophysiology Critical Care Palliative Care General Surgery        Assessment and Plan: * Stroke (cerebrum) (HCC) Intraparenchymal hematoma Subarachnoid hemorrhage Patient admitted and underwent TNK.  Later same day had second stroke with left M2 occlusion and underwent thrombectomy.    Source of both likely cardioembolic from pseudoaneurysm.  Unfortunately, post-TNK/thrombectomy course complicated by ICH and SAH.    Heparin was started due to pseudoaneurysm but ICH size increased so this was held for 1 week.  Serial CTs eventually showed stabilization, transitioned to heparin again, and finally Eliquis.  After starting Eliquis, size of hematomas again increased.  Eliquis held.  Disussed with Neurology.  CT last night stable, okay to resume Eliquis tomorrow  - Continue Lipitor - Resume Eliquis tomorrow    Bacteremia due to Gram-positive bacteria Treated with 2 weeks IV cefazolin.  ID consulted.  TEE showed no vegetations and no complications with ICD leads.  ID recommended stopping antibiotics.   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  Stable by echo as recently as Jan of this year.  After admission, Echo showed the patch dehisced and he had adherent thrombus.  Cardiology were consulted and recommended he would need long term anticoagulation and that ultimately, if his neurologic recovery allowed and he were felt to be healthy enough, repeat pseudoaneurysm repair  would be indicated (a very high risk operation)   Acute on chronic systolic CHF (congestive heart failure) (HCC) Treated and now appears  euvolemic - Continue Entresto, BB, spiro, Lasix  Essential hypertension - Continue Entresto, Coreg, Lasix, metoprolol, spironolactone  Dyslipidemia - Continue Lipitor  Dysphagia PEG placed 7/10.   - Continue bolus feeds - SS correction insulin with feeds    Cerebral edema (HCC) Treated with hypertonic saline.  Stable and no further 3% saline required.  Prognosis poor.  Thrombocytopenia (HCC) Resolved.  Aspiration pneumonia (HCC) Treated with Rocephin to cefazolin, now resolevd.  ICD (implantable cardioverter-defibrillator) in place    Obesity (BMI 30.0-34.9) BMI 32.2  Coronary artery disease   Acute respiratory failure with hypoxia (HCC) On 6/19 and 6/20 was tachypneic to the high 20s and hypoxic, requiring 4L nasal cannula.  Due to aspiration pneumonia and CHF.  Treated and resolved.            Subjective: Patient has no complaints.  No fever, no confusion, no respiratory distress.  No change in neurological status.     Physical Exam: BP 99/72 (BP Location: Left Arm)   Pulse 92   Temp 98.6 F (37 C)   Resp 16   Ht 6\' 1"  (1.854 m)   Wt 98.4 kg   SpO2 97%   BMI 28.62 kg/m   Adult male, sitting up in bed, interactive and appropriate RRR, no murmurs, no peripheral edema Respiratory normal, lungs clear without rales or wheezes Abdomen soft without tenderness palpation He has 1/5 strength on the right, 5/5 in the left.  He is heavily dysarthric.  However he is responding appropriately to questions, nods his head yes and no intelligently.    Data Reviewed: Discussed with neurology CT head reviewed  Family Communication: Girlfriend [name] by phone    Disposition: Status is: Inpatient The patient was admitted with stroke.  He had a pair of Strokes in the setting of a ruptured pseudoaneurysm with left ventricular thrombus.  Neurologically he is stabilized and appears to be improving.  Having said he has an extremely problematic duet of Problems,  intracranial hemorrhage and pseudoaneurysm with clot.  Remarkably, he is alert and pleasant and trying to rehab.            Author: Alberteen Sam, MD 10/31/2022 5:08 PM  For on call review [email or web address].

## 2022-11-01 DIAGNOSIS — I5023 Acute on chronic systolic (congestive) heart failure: Secondary | ICD-10-CM | POA: Diagnosis not present

## 2022-11-01 DIAGNOSIS — I10 Essential (primary) hypertension: Secondary | ICD-10-CM | POA: Diagnosis not present

## 2022-11-01 DIAGNOSIS — I63413 Cerebral infarction due to embolism of bilateral middle cerebral arteries: Secondary | ICD-10-CM | POA: Diagnosis not present

## 2022-11-01 LAB — GLUCOSE, CAPILLARY
Glucose-Capillary: 93 mg/dL (ref 70–99)
Glucose-Capillary: 106 mg/dL — ABNORMAL HIGH (ref 70–99)
Glucose-Capillary: 173 mg/dL — ABNORMAL HIGH (ref 70–99)
Glucose-Capillary: 138 mg/dL — ABNORMAL HIGH (ref 70–99)

## 2022-11-01 LAB — COMPREHENSIVE METABOLIC PANEL
ALT: 24 U/L (ref 0–44)
AST: 29 U/L (ref 15–41)
Albumin: 3 g/dL — ABNORMAL LOW (ref 3.5–5.0)
Alkaline Phosphatase: 97 U/L (ref 38–126)
Anion gap: 5 (ref 5–15)
BUN: 23 mg/dL (ref 8–23)
CO2: 26 mmol/L (ref 22–32)
Calcium: 8.4 mg/dL — ABNORMAL LOW (ref 8.9–10.3)
Chloride: 96 mmol/L — ABNORMAL LOW (ref 98–111)
Creatinine, Ser: 0.82 mg/dL (ref 0.61–1.24)
GFR, Estimated: >60 mL/min (ref >60)
Glucose, Bld: 84 mg/dL (ref 70–99)
Potassium: 4.6 mmol/L (ref 3.5–5.1)
Sodium: 127 mmol/L — ABNORMAL LOW (ref 135–145)
Total Bilirubin: 0.6 mg/dL (ref 0.3–1.2)
Total Protein: 6.2 g/dL — ABNORMAL LOW (ref 6.5–8.1)

## 2022-11-01 LAB — CBC
HCT: 33.4 % — ABNORMAL LOW (ref 39.0–52.0)
Hemoglobin: 11.5 g/dL — ABNORMAL LOW (ref 13.0–17.0)
MCH: 30.4 pg (ref 26.0–34.0)
MCHC: 34.4 g/dL (ref 30.0–36.0)
MCV: 88.4 fL (ref 80.0–100.0)
Platelets: 115 10*3/uL — ABNORMAL LOW (ref 150–400)
RBC: 3.78 MIL/uL — ABNORMAL LOW (ref 4.22–5.81)
RDW: 12.3 % (ref 11.5–15.5)
WBC: 8.1 10*3/uL (ref 4.0–10.5)
nRBC: 0 % (ref 0.0–0.2)

## 2022-11-01 NOTE — Progress Notes (Signed)
[facility]                                                                               [name], is a 62 y.o. male, DOB - 17-Dec-1960, HQI:[id number] Admit date - 10/02/2022    Outpatient Primary MD for the patient is [name], MD  LOS - 30  days    Brief summary   [name] is a 62 y.o. 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     Significant events: 6/18: Admitted for stroke, received TNK; developed right sided deficits overnight after TNK, underwent NIR thrombectomy 6/19: Post thrombectomy CT showed left frontal IPH and SAH 6/20: Heparin started cautiously 6/22: CT showed progression of hematomas, heparin stopped 6/23: Repeat echo shows dehiscence of prior pseudoaneurysm patch with adherent LV thrombus; Cardiology consulted 6/26: ID consulted for MSSE bacteremia 6/28: EP consulted, recommended not to take out ICD; CT stabilized, heparin restarted 7/2: Transferred OOU 7/10: Placed PEG tube 7/11: Transitioned to basal bolus feedings and switched to Eliquis 7/13: Increased HA, repeat CT shows progression of ICHs; Eliquis held 7/15: TEE negative, antibiotics stopped    Significant studies: 6/18 CTA head and neck: no LVO 6/18 repeat CTA head and neck: new distal left MCA M2 occlusion 6/19 Echo: poor quality images, nondiagnostic 6/19 TCD: positive study, indicating R>L shunt 6/19 LE doppler: no DVT 6/19 CT head: left frontal parietal IPH and adjacent [facility] 6/22 CT head: size now 8x4x4cm, with new 2cm component 6/23 complete echo: now shows recurrence of pseudoaneurysm, dehiscence of patch with adherent thrombus 6/25 coronary CTA: confirms above 6/28 CT head: stable large left frontoparietal hematoma 7/13: CT head shows worsening ICH 7/15 TEE: shows pseudoaneurysm with adherent clot, no  valvular vegetations, no signs of vegetation on ICD leads 7/16: CT head shows regression of ICH noted 7/13   Significant microbiology data: 6/18 MRSA nares: Negative 6/25 Blood cultures: MSSE in 2/2 6/27 blood cultures: NG in 2/2    Procedures: 6/18: TNK at 1246 6/19: Thrombectomy by Dr. Tommie Sams 6/19: Cortrak placed 7/10: PEG placed 7/15: TEE    Consults: Cardiology Infectious disease Electrophysiology Critical Care Palliative Care General Surgery    Assessment & Plan    Assessment and Plan:    Stroke/intraparenchymal hematoma/subarachnoid hemorrhage Cardioembolic from pseudoaneurysm S/p TNK. Patient had another stroke later the same day showing left M2 occlusion, underwent thrombectomy. Complicated by ICH and subarachnoid hemorrhage. Currently on Eliquis.  Last CT last night showed stable ICH Monitor 48 hours on Eliquis and then plan for disposition.   Pseudoaneurysm of the left ventricle  Echo showed dehiscence of patch with an indurated thrombus. Cardiology consulted recommended long-term anticoagulation. Possible repeat pseudoaneurysm repair if patient has neurological recovery.   Acute on chronic systolic heart failure Patient appears euvolemic Continue with Lasix, Entresto, beta-blockers and spironolactone.   Bacteremia secondary to gram-positive bacteria Completed 2 weeks of IV cefazolin ID consulted TEE does not show vegetations or any complications with ICD leads.   Hypertension  Blood pressure parameters are optimal   Dysphagia Status post PEG placement on 7/10 Continue sliding scale insulin    Cerebral edema S/p hypertonic saline   Malnutrition Type:  Nutrition Problem: Inadequate oral intake Etiology: inability to eat   Malnutrition Characteristics:  Signs/Symptoms: NPO status   Nutrition Interventions:  Interventions: MVI, Tube feeding  Estimated body mass index is 29.03 kg/m as calculated from the  following:   Height as of this encounter: 6\' 1"  (1.854 m).   Weight as of this encounter: 99.8 kg.  Code Status: DNR DVT Prophylaxis:   apixaban (ELIQUIS) tablet 5 mg   Level of Care: Level of care: Telemetry Medical Family Communication: NONE AT BEDSIDE.  Disposition Plan:     Remains inpatient appropriate:  monitor for worsening ICH on eliquis.   Antimicrobials:   Anti-infectives (From admission, onward)    Start     Dose/Rate Route Frequency Ordered Stop   10/11/22 1515  cefTRIAXone (ROCEPHIN) 2 g in sodium chloride 0.9 % 100 mL IVPB  Status:  Discontinued        2 g 200 mL/hr over 30 Minutes Intravenous Every 24 hours 10/10/22 1934 10/10/22 1939   10/11/22 1515  cefTRIAXone (ROCEPHIN) 2 g in sodium chloride 0.9 % 100 mL IVPB  Status:  Discontinued        2 g 200 mL/hr over 30 Minutes Intravenous Every 24 hours 10/10/22 1939 10/11/22 0857   10/11/22 0945  ceFAZolin (ANCEF) IVPB 2g/100 mL premix  Status:  Discontinued        2 g 200 mL/hr over 30 Minutes Intravenous Every 8 hours 10/11/22 0857 10/29/22 1327   10/10/22 2030  cefTRIAXone (ROCEPHIN) 1 g in sodium chloride 0.9 % 100 mL IVPB  Status:  Discontinued        1 g 200 mL/hr over 30 Minutes Intravenous  Once 10/10/22 1934 10/10/22 1939   10/10/22 2030  cefTRIAXone (ROCEPHIN) 1 g in sodium chloride 0.9 % 100 mL IVPB        1 g 200 mL/hr over 30 Minutes Intravenous  Once 10/10/22 1939 10/10/22 2158   10/09/22 1428  cefTRIAXone (ROCEPHIN) 1 g in sodium chloride 0.9 % 100 mL IVPB  Status:  Discontinued        1 g 200 mL/hr over 30 Minutes Intravenous Every 24 hours 10/09/22 1428 10/10/22 1934   10/09/22 1145  cefTRIAXone (ROCEPHIN) 1 g in sodium chloride 0.9 % 100 mL IVPB  Status:  Discontinued        1 g 200 mL/hr over 30 Minutes Intravenous Every 24 hours 10/09/22 1048 10/09/22 1428        Medications  Scheduled Meds:  apixaban  5 mg Oral BID   atorvastatin  80 mg Oral Daily   famotidine  20 mg Oral BID    feeding supplement (JEVITY 1.5 CAL/FIBER)  415 mL Per Tube TID PC & HS   fiber supplement (BANATROL TF)  60 mL Per Tube BID   free water  60 mL Per Tube TID PC & HS   gabapentin  300 mg Oral QHS   Gerhardt's butt cream   Topical BID   insulin aspart  0-15 Units Subcutaneous TID WC   insulin aspart  0-5 Units Subcutaneous QHS   multivitamin with minerals  1 tablet Oral Daily   nicotine  7 mg Transdermal Daily   polyethylene glycol  17 g Oral BID   spironolactone  12.5 mg Oral Daily   Continuous Infusions:  sodium  chloride 10 mL/hr at 10/22/22 1331   PRN Meds:.sodium chloride, acetaminophen **OR** acetaminophen (TYLENOL) oral liquid 160 mg/5 mL **OR** acetaminophen, metoprolol tartrate, ondansetron (ZOFRAN) IV, mouth rinse, pneumococcal 20-valent conjugate vaccine    Subjective:   [name] was seen and examined today.  No new complaints.   Objective:   Vitals:   10/31/22 2354 11/01/22 0401 11/01/22 0500 11/01/22 0733  BP: 96/74 113/80  110/80  Pulse: 80 81  81  Resp: 20 19  17   Temp: 97.9 F (36.6 C) 97.9 F (36.6 C)  98.2 F (36.8 C)  TempSrc: Oral Oral  Oral  SpO2: 99% 100%  100%  Weight:   99.8 kg   Height:        Intake/Output Summary (Last 24 hours) at 11/01/2022 0948 Last data filed at 11/01/2022 0653 Gross per 24 hour  Intake 420 ml  Output 1325 ml  Net -905 ml   Filed Weights   10/30/22 0500 10/31/22 0507 11/01/22 0500  Weight: 98.8 kg 98.4 kg 99.8 kg     Exam General exam: Appears calm and comfortable  Respiratory system: Clear to auscultation. Respiratory effort normal. Cardiovascular system: S1 & S2 heard, RRR. No JVD Gastrointestinal system: Abdomen is nondistended, soft and nontender.  Central nervous system: Alert and oriented. No focal neurological deficits. Extremities: Symmetric 5 x 5 power. Skin: No rashes, Psychiatry: Mood & affect appropriate.    Data Reviewed:  I have personally reviewed following labs and imaging  studies   CBC Lab Results  Component Value Date   WBC 8.1 11/01/2022   RBC 3.78 (L) 11/01/2022   HGB 11.5 (L) 11/01/2022   HCT 33.4 (L) 11/01/2022   MCV 88.4 11/01/2022   MCH 30.4 11/01/2022   PLT 115 (L) 11/01/2022   MCHC 34.4 11/01/2022   RDW 12.3 11/01/2022   LYMPHSABS 2.6 10/02/2022   MONOABS 0.7 10/02/2022   EOSABS 0.1 10/02/2022   BASOSABS 0.1 10/02/2022     Last metabolic panel Lab Results  Component Value Date   NA 127 (L) 11/01/2022   K 4.6 11/01/2022   CL 96 (L) 11/01/2022   CO2 26 11/01/2022   BUN 23 11/01/2022   CREATININE 0.82 11/01/2022   GLUCOSE 84 11/01/2022   GFRNONAA >60 11/01/2022   GFRAA 92 05/06/2020   CALCIUM 8.4 (L) 11/01/2022   PHOS 3.9 10/16/2022   PROT 6.2 (L) 11/01/2022   ALBUMIN 3.0 (L) 11/01/2022   LABGLOB 2.4 05/25/2021   AGRATIO 1.8 05/25/2021   BILITOT 0.6 11/01/2022   ALKPHOS 97 11/01/2022   AST 29 11/01/2022   ALT 24 11/01/2022   ANIONGAP 5 11/01/2022    CBG (last 3)  Recent Labs    10/31/22 1658 10/31/22 2119 11/01/22 0622  GLUCAP 159* 178* 93      Coagulation Profile: No results for input(s): "INR", "PROTIME" in the last 168 hours.   Radiology Studies: CT HEAD WO CONTRAST ( )  Result Date: 10/30/2022 CLINICAL DATA:  Stroke follow-up EXAM: CT HEAD WITHOUT CONTRAST TECHNIQUE: Contiguous axial images were obtained from the base of the skull through the vertex without intravenous contrast. RADIATION DOSE REDUCTION: This exam was performed according to the departmental dose-optimization program which includes automated exposure control, adjustment of the mA and/or kV according to patient size and/or use of iterative reconstruction technique. COMPARISON:  CT head 10/27/2022. FINDINGS: Brain: The subacute left frontoparietal intraparenchymal hematoma measuring up to 2.7 cm is unchanged in size but overall decreased in density consistent with expected evolution. The subacute  intraparenchymal hematoma in the posterior limb  of the left internal capsule measuring up to 2.0 cm is unchanged in size. The subcentimeter focus of hemorrhage is superior in the left periventricular white matter is unchanged (3-20). Subarachnoid hemorrhage overlying both occipital lobes is similar in volume. Subarachnoid blood overlying both cerebellar hemispheres is not significantly changed in volume. There is small volume subarachnoid hemorrhage overlying both cerebral hemispheres near the vertex, likely redistributed blood (3-25). Small volume intraventricular blood layering in the right occipital horn is unchanged. Intraventricular blood in the left occipital horn is no longer seen. Multifocal encephalomalacia in both cerebral and cerebellar hemispheres is unchanged. There is no new acute territorial infarct. The basal cisterns remain patent. The ventricles are stable in size, with no new or progressive hydrocephalus. There is no midline shift. Vascular: No hyperdense vessel or unexpected calcification. Skull: Normal. Negative for fracture or focal lesion. Sinuses/Orbits: The imaged paranasal sinuses are clear. Bilateral lens implants are in place. The globes and orbits are otherwise unremarkable. Other: The mastoid air cells and middle ear cavities are clear. IMPRESSION: Intraventricular blood in the left occipital horn is no longer apparent. Otherwise, intraparenchymal and subarachnoid blood in both cerebral and cerebellar hemispheres and small volume intraventricular blood in the right lateral ventricle are similar in volume with expected evolution since 10/27/2022. No midline shift. Electronically Signed   By: Lesia Hausen M.D.   On: 10/30/2022 15:57   DG Swallowing Func-Speech Pathology  Result Date: 10/30/2022 Table formatting from the original result was not included. Modified Barium Swallow Study Patient Details Name: [name] MRN: [id number] Date of Birth: 08/25/1960 Today's Date: 10/30/2022 HPI/PMH: HPI: Pt is a 62 y/o male who presented  6/19 with L weakness and numbness. Pt received TNK then developed new symptoms of aphasia and R weakness. He underwent mechanical thrombectomy L M3 segment MCA 6/19. Pt noted to have small hemorrhage and follow up CT showed progression of hemorrhage 3cm then 7cm and small acute R cerebellar infarct. PEG placed 7/10. PMH: CAD s/p PCI w/stent and 1v CABG, multiple CGAs, systolic HF s/p ICD, HLD, HTN. Clinical Impression: Clinical Impression: Pt seen today for a third MBS following suspected improvements in swallowing function noted at bedside. Pt with improving ability to follow commands to initiate a second swallow. Overall, pt presents with a mild oropharyngeal dysphagia characterized by R CN VII and CN XII weakness. Compared to prior MBS, oral and pharyngeal residue has significantly improved, although is not completely resolved specifically with solid textures. He successfully cleared all residue with cued subsequent swallows and sips of thin liquids. No penetration or aspiration observed with trials of nectar thick liquids, honey thick liquids, purees, or solids. Challenging consecutive sips of thin liquids via straw following a large solid bolus resulted in penetration to the level of the cords (PAS 3) before the swallow with immediate sensation and a strong cough to clear material from airway independently. Recommend initiating a diet of Dys 2 textures and thin liquids only with full supervision. Following bites with sips will likely be helpful to clear any residue. Meds can be adminsitered through G tube, although could likely be crushed in puree PRN. Factors that may increase risk of adverse event in presence of aspiration [name] & [name] 2021): Factors that may increase risk of adverse event in presence of aspiration [name] & [name] 2021): Limited mobility; Inadequate oral hygiene; Dependence for feeding and/or oral hygiene; Reduced cognitive function Recommendations/Plan: Swallowing Evaluation  Recommendations Swallowing Evaluation Recommendations Recommendations: PO diet  PO Diet Recommendation: Thin liquids (Level 0); Dysphagia 2 (Finely chopped) Liquid Administration via: Straw; Cup Medication Administration: Via alternative means Supervision: Full supervision/cueing for swallowing strategies; Staff to assist with self-feeding Swallowing strategies  : Slow rate; Small bites/sips; Check for anterior loss; Check for pocketing or oral holding Postural changes: Position pt fully upright for meals; Stay upright 30-60 min after meals Oral care recommendations: Oral care BID (2x/day) Caregiver Recommendations: Have oral suction available Treatment Plan Treatment Plan Treatment recommendations: Therapy as outlined in treatment plan below Follow-up recommendations: Acute inpatient rehab (3 hours/day) Functional status assessment: Patient has had a recent decline in their functional status and demonstrates the ability to make significant improvements in function in a reasonable and predictable amount of time. Treatment frequency: Min 2x/week Treatment duration: 1 week Interventions: Aspiration precaution training; Compensatory techniques; Patient/family education; Trials of upgraded texture/liquids; Diet toleration management by SLP Recommendations Recommendations for follow up therapy are one component of a multi-disciplinary discharge planning process, led by the attending physician.  Recommendations may be updated based on patient status, additional functional criteria and insurance authorization. Assessment: Orofacial Exam: Orofacial Exam Oral Cavity: Oral Hygiene: WFL Oral Cavity - Dentition: Edentulous Orofacial Anatomy: WFL Oral Motor/Sensory Function: Suspected cranial nerve impairment CN V - Trigeminal: Right motor impairment; Right sensory impairment CN VII - Facial: Right motor impairment CN IX - Glossopharyngeal, CN X - Vagus: Right motor impairment CN XII - Hypoglossal: Right motor impairment Anatomy:  Anatomy: Suspected cervical osteophytes Boluses Administered: Boluses Administered Boluses Administered: Thin liquids (Level 0); Mildly thick liquids (Level 2, nectar thick); Moderately thick liquids (Level 3, honey thick); Puree  Oral Impairment Domain: Oral Impairment Domain Lip Closure: Escape progressing to mid-chin Tongue control during bolus hold: Not tested Bolus preparation/mastication: Slow prolonged chewing/mashing with complete recollection Bolus transport/lingual motion: Brisk tongue motion Oral residue: Residue collection on oral structures Location of oral residue : Tongue; Palate Initiation of pharyngeal swallow : Pyriform sinuses  Pharyngeal Impairment Domain: Pharyngeal Impairment Domain Soft palate elevation: No bolus between soft palate (SP)/pharyngeal wall (PW) Laryngeal elevation: Complete superior movement of thyroid cartilage with complete approximation of arytenoids to epiglottic petiole Anterior hyoid excursion: Partial anterior movement Epiglottic movement: Complete inversion Laryngeal vestibule closure: Incomplete, narrow column air/contrast in laryngeal vestibule Pharyngeal stripping wave : Present - complete Pharyngeal contraction (A/P view only): N/A Pharyngoesophageal segment opening: Partial distention/partial duration, partial obstruction of flow Tongue base retraction: Trace column of contrast or air between tongue base and PPW Pharyngeal residue: Collection of residue within or on pharyngeal structures Location of pharyngeal residue: Tongue base; Pyriform sinuses; Valleculae  Esophageal Impairment Domain: Esophageal Impairment Domain Esophageal clearance upright position: -- (N/A) Pill: Pill Consistency administered: -- (N/A) Penetration/Aspiration Scale Score: Penetration/Aspiration Scale Score 1.  Material does not enter airway: Mildly thick liquids (Level 2, nectar thick); Moderately thick liquids (Level 3, honey thick); Puree; Solid 4.  Material enters airway, CONTACTS cords  then ejected out: Thin liquids (Level 0) Compensatory Strategies: Compensatory Strategies Compensatory strategies: Yes Straw: Effective Effective Straw: Thin liquid (Level 0)   General Information: Caregiver present: No  Diet Prior to this Study: NPO; G-tube   Temperature : Normal   Respiratory Status: WFL   Supplemental O2: None (Room air)   History of Recent Intubation: No  Behavior/Cognition: Alert; Cooperative; Pleasant mood; Requires cueing Self-Feeding Abilities: Needs assist with self-feeding Baseline vocal quality/speech: Not observed Volitional Cough: Able to elicit Volitional Swallow: Able to elicit Exam Limitations: Poor positioning Goal Planning: Prognosis for improved oropharyngeal  function: Good Barriers to Reach Goals: Cognitive deficits; Language deficits No data recorded Patient/Family Stated Goal: pt eager to eat and drink Consulted and agree with results and recommendations: Patient Pain: Pain Assessment Pain Assessment: Faces Faces Pain Scale: 0 Pain Location: back Pain Descriptors / Indicators: Grimacing; Guarding Pain Intervention(s): Monitored during session; Repositioned End of Session: Start Time:SLP Start Time (ACUTE ONLY): 1010 Stop Time: SLP Stop Time (ACUTE ONLY): 1038 Time Calculation:SLP Time Calculation (min) (ACUTE ONLY): 28 min Charges: SLP Evaluations $ SLP Speech Visit: 1 Visit SLP Evaluations $MBS Swallow: 1 Procedure $Swallowing Treatment: 1 Procedure $Speech Treatment for Individual: 1 Procedure SLP visit diagnosis: SLP Visit Diagnosis: Dysphagia, oropharyngeal phase (R13.12) Past Medical History: Past Medical History: Diagnosis Date  Abnormal EKG   hx of ischemia showing up on ekg's  Acute myopericarditis   a. 03/2013: readm with hypoxia, tachycardia, elevated ESR/CRP, elevated troponin with Dressler syndrome and myopericarditis; treated with steroids.  Acute respiratory failure (HCC)   a. 01/2013: VDRF. b. 03/2013: hypoxia requiring supp O2 during adm, resolved by discharge.   C. difficile colitis   a. 01/2013 during prolonged adm. b. Recurred 03/2013.  Cardiac tamponade   a. 01/2013 s/p drain.  Cataracts, bilateral   Coronary artery disease   a. s/p MI in 2009 in [place] with stenting of the LCX and LAD;  b. 12/2012 NSTEMI/CAD: LM nl, LAD patent prox stent, LCX 50-70 isr (FFR 0.84), RCA dom, 110m, EF 40-45%-->Med Rx. c. 01/2013: anterolateral STEMI complicated by pericardial effusion (presumed purulent pericarditis) and tamponade s/p drain, ruptured LV pseudoaneurysm s/p CorMatrix patch, CABGx1 (SVG-OM1), fever, CVA, VDRF, C diff.  CVA (cerebral infarction)   a. 01/2013 in setting of prolonged hospitalization - residual L arm weakness.  Dressler syndrome (HCC)   a. 03/2013: readm with hypoxia, tachycardia, elevated ESR/CRP, elevated troponin with Dressler syndrome and myopericarditis; treated with steroids.  Glaucoma   Hyperlipidemia   Hypokalemia   Hyponatremia   a. During late 2014.  Ischemic cardiomyopathy   a. Sept/Oct 2014: EF ~40% (ICM). b. 03/2013: EF 25-30%. Off ACEI due to hypotension (MIXED NICM/ICM).  Optic neuropathy, ischemic   Pericardial effusion   a. 01/2013: pericardial effusion (presumed purulent pericarditis) and cardiac tamponade s/p drain. b. Persistent moderate pericardial effusion 03/2013.  Pre-diabetes   Pseudoaneurysm of left ventricle of heart   a. 01/2013:  Ruptured inferoposterior LV pseudoaneurysm s/p CorMatrix patch.  Sinus bradycardia   asymptomatic  Stroke [facility]) 2009  weak lt side-lt arm  Tobacco abuse   Wears dentures   top Past Surgical History: Past Surgical History: Procedure Laterality Date  CARDIAC CATHETERIZATION  01/06/2013  CATARACT EXTRACTION Bilateral 05/2021  COLONOSCOPY WITH PROPOFOL N/A 07/03/2016  Procedure: COLONOSCOPY WITH PROPOFOL;  Surgeon: Ruffin Frederick, MD;  Location: [facility];  Service: Gastroenterology;  Laterality: N/A;  CORONARY ANGIOPLASTY WITH STENT PLACEMENT  2000's X 2  "1 + 1" (01/06/2013)  CORONARY ARTERY BYPASS GRAFT  N/A 01/28/2013  Procedure: CORONARY ARTERY BYPASS GRAFTING (CABG);  Surgeon: Loreli Slot, MD;  Location: [facility] OR;  Service: Open Heart Surgery;  Laterality: N/A;  CABG x one, using left greater saphenous vein harvested endoscopically  ICD IMPLANT N/A 05/25/2020  Procedure: ICD IMPLANT;  Surgeon: Hillis Range, MD;  Location: [facility] INVASIVE CV LAB;  Service: Cardiovascular;  Laterality: N/A;  INTRAOPERATIVE TRANSESOPHAGEAL ECHOCARDIOGRAM N/A 01/28/2013  Procedure: INTRAOPERATIVE TRANSESOPHAGEAL ECHOCARDIOGRAM;  Surgeon: Loreli Slot, MD;  Location: [facility] OR;  Service: Open Heart Surgery;  Laterality: N/A;  IR CT HEAD LTD  10/03/2022  IR PERCUTANEOUS ART THROMBECTOMY/INFUSION INTRACRANIAL INC DIAG ANGIO  10/03/2022  IR US GUIDE VASC ACCESS RIGHT  10/03/2022  LAPAROSCOPY N/A 10/24/2022  Procedure: LAPAROSCOPY ASSISTED GASTROSTOMY (PEG) PLACEMENT;  Surgeon: Diamantina Monks, MD;  Location: [facility] OR;  Service: General;  Laterality: N/A;  LEFT HEART CATH AND CORS/GRAFTS ANGIOGRAPHY N/A 09/13/2020  Procedure: LEFT HEART CATH AND CORS/GRAFTS ANGIOGRAPHY;  Surgeon: Swaziland, Peter M, MD;  Location: [facility]VAS[facility] CV LAB;  Service: Cardiovascular;  Laterality: N/A;  LEFT HEART CATHETERIZATION WITH CORONARY ANGIOGRAM N/A 01/06/2013  Procedure: LEFT HEART CATHETERIZATION WITH CORONARY ANGIOGRAM;  Surgeon: Peter M Swaziland, MD;  Location: [facility] CATH LAB;  Service: Cardiovascular;  Laterality: N/A;  LEFT HEART CATHETERIZATION WITH CORONARY ANGIOGRAM N/A 01/21/2013  Procedure: LEFT HEART CATHETERIZATION WITH CORONARY ANGIOGRAM;  Surgeon: Kathleene Hazel, MD;  Location: [facility] CATH LAB;  Service: Cardiovascular;  Laterality: N/A;  LUMBAR LAMINECTOMY/ DECOMPRESSION WITH MET-RX Right 06/30/2018  Procedure: Right minimally invasive Lumbar Three-Four Far lateral discectomy;  Surgeon: Jadene Pierini, MD;  Location: [facility] OR;  Service: Neurosurgery;  Laterality: Right;  Right minimally invasive Lumbar Three-Four Far lateral discectomy  MASS  EXCISION Right 07/21/2014  Procedure: EXCISION RIGHT NECK MASS;  Surgeon: Harriette Bouillon, MD;  Location:  SURGERY CENTER;  Service: General;  Laterality: Right;  PEG PLACEMENT N/A 10/24/2022  Procedure: ATTEMPTED PERCUTANEOUS ENDOSCOPIC GASTROSTOMY (PEG) PLACEMENT;  Surgeon: Diamantina Monks, MD;  Location: [facility] OR;  Service: General;  Laterality: N/A;  PERICARDIAL TAP N/A 01/24/2013  Procedure: PERICARDIAL TAP;  Surgeon: Micheline Chapman, MD;  Location: [facility] CATH LAB;  Service: Cardiovascular;  Laterality: N/A;  RADIOLOGY WITH ANESTHESIA N/A 10/03/2022  Procedure: IR WITH ANESTHESIA;  Surgeon: Julieanne Cotton, MD;  Location: [facility] OR;  Service: Radiology;  Laterality: N/A;  RIGHT HEART CATHETERIZATION Right 01/24/2013  Procedure: RIGHT HEART CATH;  Surgeon: Micheline Chapman, MD;  Location: [facility] CATH LAB;  Service: Cardiovascular;  Laterality: Right;  TEE WITHOUT CARDIOVERSION N/A 10/29/2022  Procedure: TRANSESOPHAGEAL ECHOCARDIOGRAM;  Surgeon: [name], MD;  Location: [facility] INVASIVE CV LAB;  Service: Cardiovascular;  Laterality: N/A;  VENTRICULAR ANEURYSM RESECTION N/A 01/28/2013  Procedure: LEFT VENTRICULAR ANEURYSM REPAIR;  Surgeon: Loreli Slot, MD;  Location: [facility] OR;  Service: Open Heart Surgery;  Laterality: N/A; [name], M.A., CF-SLP Speech Language Pathology, Acute Rehabilitation Services Secure Chat preferred [phone] 10/30/2022, 11:30 AM      Kathlen Mody M.D. [facility] 11/01/2022, 9:48 AM  Available via Epic secure chat 7am-7pm After 7 pm, please refer to night coverage provider listed on amion.

## 2022-11-01 NOTE — Progress Notes (Signed)
Occupational Therapy Treatment Patient Details Name: [name] MRN: [id number] DOB: 03/02/1961 Today's Date: 11/01/2022   History of present illness Patient is a 62 y/o male admitted 10/03/22 with L weakness and numbness.  Received TNK then developed now symptoms of aphasia and R weakness.  He underwent mechanical thrombectomy L M3 segment MCA noted to have small hemorrhage and follow up CT showed progression of hemorrhage 3cm then 7cm and small acute R cerebellar infarct. PEG placed 7/10. CT on 7/13 showed slight diminished size of largest left frontal parietal lobe hematoma, but increased size of left periventricular hematoma, and several small new acute appearing hemorrhages posterior to this, and also in the right occipital lobe, left occipital lobe, left cerebellum and some hemorrhage layering in the bilateral occipital horns PMH positive for CAD s/p PCI w/stent and 1v CABG, multiple CGAs, systolic HF s/p ICD, HLD, HTN.   OT comments  Pt making good progress during session today, more alert and some improvements noted in speech. Pt continues to require extensive assist for bed mobility and OOB attempts. However, pt was able to successfully stand and squat pivot with +2 assist to chair today. Pt with noted stiffness/discomfort during session that improved some with PROM. Patient will benefit from continued inpatient follow up therapy, <3 hours/day at DC.   Recommendations for follow up therapy are one component of a multi-disciplinary discharge planning process, led by the attending physician.  Recommendations may be updated based on patient status, additional functional criteria and insurance authorization.    Assistance Recommended at Discharge Frequent or constant Supervision/Assistance  Patient can return home with the following  Two people to help with walking and/or transfers;Two people to help with bathing/dressing/bathroom;Assistance with cooking/housework;Assistance with feeding;Help  with stairs or ramp for entrance;Direct supervision/assist for financial management;Assist for transportation;Direct supervision/assist for medications management   Equipment Recommendations  Wheelchair cushion (measurements OT);Wheelchair (measurements OT) (TBD)    Recommendations for Other Services      Precautions / Restrictions Precautions Precautions: Fall;Other (comment) Precaution Comments: PEG Restrictions Weight Bearing Restrictions: No       Mobility Bed Mobility Overal bed mobility: Needs Assistance Bed Mobility: Rolling, Sidelying to Sit Rolling: Max assist Sidelying to sit: Max assist, +2 for physical assistance, +2 for safety/equipment       General bed mobility comments: Pt with increased stiffness/discomfort, opted for log rolling to get EOB with Max A x 2 needed (pt with difficulty reaching bedrail with L UE to assist)    Transfers Overall transfer level: Needs assistance Equipment used: 2 person hand held assist Transfers: Sit to/from Stand, Bed to chair/wheelchair/BSC Sit to Stand: Mod assist, +2 physical assistance, +2 safety/equipment   Squat pivot transfers: +2 physical assistance, +2 safety/equipment, Max assist       General transfer comment: Trialed standing at bedside with pt able to hold L UE behind staff member's arm with R UE supported by therapist. R knee blocking and pt able to come to standing though unable to hold more than 5 sec due to discomfort. Opted for squat pivot from bed to drop arm recliner on L side with R knee blocking.     Balance Overall balance assessment: Needs assistance Sitting-balance support: Single extremity supported, Feet supported Sitting balance-Leahy Scale: Poor Sitting balance - Comments: initially min guard with intermittent Min A with fatigue Postural control: Right lateral lean Standing balance support: Bilateral upper extremity supported Standing balance-Leahy Scale: Poor  ADL either performed or assessed with clinical judgement   ADL Overall ADL's : Needs assistance/impaired     Grooming: Minimal assistance;Sitting;Wash/dry face Grooming Details (indicate cue type and reason): cues for proper grasp with L UE on washcloth. able to bring to face to wash eyes quickly                               General ADL Comments: Focus on EOB balance, standing trials and pivot to recliner    Extremity/Trunk Assessment Upper Extremity Assessment Upper Extremity Assessment: RUE deficits/detail RUE Deficits / Details: able to  make fist and extend digits partially actively. able to lift UE from bed to abdomen but difficulty replicating movements on command. discomfort in digit extension today. able to easily stretch all digits aside from 3rd digit with some increased stiffness at PIP joint. Placed heat pack on hand with washcloth to hopefully improve tissue extensibility RUE Sensation: decreased light touch RUE Coordination: decreased fine motor;decreased gross motor   Lower Extremity Assessment Lower Extremity Assessment: Defer to PT evaluation        Vision   Vision Assessment?: No apparent visual deficits   Perception     Praxis      Cognition Arousal/Alertness: Awake/alert Behavior During Therapy: Flat affect, WFL for tasks assessed/performed Overall Cognitive Status: Difficult to assess Area of Impairment: Attention, Safety/judgement, Awareness, Following commands, Problem solving, Memory                   Current Attention Level: Selective Memory: Decreased short-term memory Following Commands: Follows one step commands with increased time, Follows one step commands inconsistently Safety/Judgement: Decreased awareness of deficits, Decreased awareness of safety Awareness: Intellectual Problem Solving: Slow processing, Requires verbal cues, Requires tactile cues General Comments: more alert and able to initially able to report  certain words/phrases clearly though aphasia still evident when attempting to express needs at end of session. follows one step commands inconsistently-limited by anticipated impaired motor planning        Exercises Exercises: Other exercises Other Exercises Other Exercises: P/AAROM of RUE focusing on digit extension    Shoulder Instructions       General Comments Pt previously with chair alarm in room as it had been used in prior therapy sessions - noted chair alarm box no longer in room - RN aware    Pertinent Vitals/ Pain       Pain Assessment Pain Assessment: Faces Faces Pain Scale: Hurts even more Pain Location: "everything" Pain Descriptors / Indicators: Grimacing, Guarding Pain Intervention(s): Monitored during session, Limited activity within patient's tolerance, Other (comment) (provided gentle PROM to R UE/LE)  Home Living                                          Prior Functioning/Environment              Frequency  Min 2X/week        Progress Toward Goals  OT Goals(current goals can now be found in the care plan section)  Progress towards OT goals: Progressing toward goals  Acute Rehab OT Goals OT Goal Formulation: Patient unable to participate in goal setting Time For Goal Achievement: 11/06/22 Potential to Achieve Goals: Fair ADL Goals Pt Will Perform Grooming: with min assist;sitting;bed level Pt Will Perform Upper Body Bathing: with mod assist;sitting Pt  Will Perform Lower Body Bathing: with mod assist;sitting/lateral leans Pt Will Perform Upper Body Dressing: with mod assist;sitting Pt Will Perform Lower Body Dressing: with mod assist;sitting/lateral leans Pt Will Transfer to Toilet: squat pivot transfer;stand pivot transfer;bedside commode;with mod assist;with +2 assist Additional ADL Goal #1: Patient will tolerate R resting hand splint 4 hours on and 4 off with no skin integrity issues.  Plan Discharge plan remains  appropriate    Co-evaluation                 AM-PAC OT "6 Clicks" Daily Activity     Outcome Measure   Help from another person eating meals?: Total Help from another person taking care of personal grooming?: A Lot Help from another person toileting, which includes using toliet, bedpan, or urinal?: Total Help from another person bathing (including washing, rinsing, drying)?: Total Help from another person to put on and taking off regular upper body clothing?: Total Help from another person to put on and taking off regular lower body clothing?: Total 6 Click Score: 7    End of Session Equipment Utilized During Treatment: Gait belt  OT Visit Diagnosis: Unsteadiness on feet (R26.81);Other abnormalities of gait and mobility (R26.89);Muscle weakness (generalized) (M62.81);Cognitive communication deficit (R41.841);Hemiplegia and hemiparesis;Low vision, both eyes (H54.2);Apraxia (R48.2);Other symptoms and signs involving cognitive function Symptoms and signs involving cognitive functions: Cerebral infarction Hemiplegia - Right/Left: Right Hemiplegia - dominant/non-dominant: Dominant Hemiplegia - caused by: Cerebral infarction   Activity Tolerance Patient tolerated treatment well   Patient Left in chair;with call bell/phone within reach;with chair alarm set   Nurse Communication Mobility status;Need for lift equipment;Other (comment) (no chair alarm box)        Time: 6213-0865 OT Time Calculation (min): 28 min  Charges: OT General Charges $OT Visit: 1 Visit OT Treatments $Therapeutic Activity: 23-37 mins  Bradd Canary, OTR/L Acute Rehab Services Office: [phone]   Lorre Munroe 11/01/2022, 10:51 AM

## 2022-11-01 NOTE — Progress Notes (Addendum)
Physical Therapy Treatment Patient Details Name: [name] MRN: [id number] DOB: Jul 13, 1960 Today's Date: 11/01/2022   History of Present Illness Patient is a 62 y/o male admitted 10/03/22 with L weakness and numbness.  Received TNK then developed now symptoms of aphasia and R weakness.  He underwent mechanical thrombectomy L M3 segment MCA noted to have small hemorrhage and follow up CT showed progression of hemorrhage 3cm then 7cm and small acute R cerebellar infarct. PEG placed 7/10. CT on 7/13 showed slight diminished size of largest left frontal parietal lobe hematoma, but increased size of left periventricular hematoma, and several small new acute appearing hemorrhages posterior to this, and also in the right occipital lobe, left occipital lobe, left cerebellum and some hemorrhage layering in the bilateral occipital horns PMH positive for CAD s/p PCI w/stent and 1v CABG, multiple CGAs, systolic HF s/p ICD, HLD, HTN.    PT Comments  Tolerated well, showing improved strength and motor control Rt side. Able to lift RLE and RUE against gravity in bed today. Actively plantar and dorsiflexing Rt ankle. (When instructed to lift affected side he lifts non-involved side simultaneously.) Still requires tactile and verbal cues to facilitate simple actions. Performed squat pivot transfer from recliner to bed (Max assist, would recommend +2.) Rt hip was sore, and felt much better repositioned back in bed. Improved function with bed mobility. Patient will benefit from continued inpatient follow up therapy, <3 hours/day Patient will continue to benefit from skilled physical therapy services to further improve independence with functional mobility.      Assistance Recommended at Discharge Frequent or constant Supervision/Assistance  If plan is discharge home, recommend the following:  Can travel by private vehicle    Two people to help with walking and/or transfers;A lot of help with  bathing/dressing/bathroom;Assistance with cooking/housework;Direct supervision/assist for medications management;Assist for transportation;Help with stairs or ramp for entrance;Assistance with feeding   No  Equipment Recommendations  Other (comment) (to be determined)    Recommendations for Other Services       Precautions / Restrictions Precautions Precautions: Fall;Other (comment) Precaution Comments: PEG Restrictions Weight Bearing Restrictions: No     Mobility  Bed Mobility Overal bed mobility: Needs Assistance Bed Mobility: Rolling, Sit to Sidelying Rolling: Max assist (mod rolling Rt)   Supine to sit: +2 for physical assistance, Max assist Sit to supine: +2 for physical assistance, Total assist Sit to sidelying: Mod assist General bed mobility comments: Pt with increased stiffness/discomfort, opted for log rolling to get EOB with Max A x 2 needed (pt with difficulty reaching bedrail with L UE to assist)    Transfers Overall transfer level: Needs assistance Equipment used: None Transfers: Bed to chair/wheelchair/BSC       Squat pivot transfers: Max assist     General transfer comment: Performed squat pivot transfer towards right side with BIL knee block. (Near total assist) however pt did facilitate forward lean, able to grasp therapist's waist with RUE, and push effortfully through LEs but still with significant weakness functionally.    Ambulation/Gait               General Gait Details: unable   Stairs             Wheelchair Mobility     Tilt Bed    Modified Rankin (Stroke Patients Only) Modified Rankin (Stroke Patients Only) Pre-Morbid Rankin Score: Moderate disability Modified Rankin: Severe disability     Balance Overall balance assessment: Needs assistance Sitting-balance support: Single extremity supported, Feet  supported Sitting balance-Leahy Scale: Poor Sitting balance - Comments: Min guard with intermittent min assist to  correct towards midline, leaning rightwards. Postural control: Right lateral lean                                  Cognition Arousal/Alertness: Awake/alert Behavior During Therapy: Flat affect, WFL for tasks assessed/performed Overall Cognitive Status: Difficult to assess Area of Impairment: Attention, Safety/judgement, Awareness, Following commands, Problem solving, Memory                   Current Attention Level: Selective Memory: Decreased short-term memory Following Commands: Follows one step commands with increased time, Follows one step commands inconsistently Safety/Judgement: Decreased awareness of deficits, Decreased awareness of safety Awareness: Intellectual Problem Solving: Slow processing, Requires verbal cues, Requires tactile cues General Comments: Verbalization slightly improved.        Exercises General Exercises - Lower Extremity Ankle Circles/Pumps: AROM, Both, 10 reps, Supine Straight Leg Raises: AAROM, Both, Supine, 5 reps    General Comments General comments (skin integrity, edema, etc.): Pt reports much more comfortable once repositioned in bed.      Pertinent Vitals/Pain Pain Assessment Pain Assessment: Faces Faces Pain Scale: Hurts even more Pain Location: Rt hip Pain Descriptors / Indicators: Grimacing, Guarding Pain Intervention(s): Monitored during session, Repositioned, Limited activity within patient's tolerance    Home Living                          Prior Function            PT Goals (current goals can now be found in the care plan section) Acute Rehab PT Goals Patient Stated Goal: to return home PT Goal Formulation: Patient unable to participate in goal setting Time For Goal Achievement: 11/13/22 Potential to Achieve Goals: Fair Progress towards PT goals: Progressing toward goals    Frequency    Min 2X/week      PT Plan Current plan remains appropriate    Co-evaluation               AM-PAC PT "6 Clicks" Mobility   Outcome Measure  Help needed turning from your back to your side while in a flat bed without using bedrails?: A Lot Help needed moving from lying on your back to sitting on the side of a flat bed without using bedrails?: A Lot Help needed moving to and from a bed to a chair (including a wheelchair)?: Total Help needed standing up from a chair using your arms (e.g., wheelchair or bedside chair)?: Total Help needed to walk in hospital room?: Total Help needed climbing 3-5 steps with a railing? : Total 6 Click Score: 8    End of Session Equipment Utilized During Treatment: Gait belt Activity Tolerance: Patient tolerated treatment well Patient left: with call bell/phone within reach;in bed;with bed alarm set;with SCD's reapplied Nurse Communication: Mobility status;Need for lift equipment PT Visit Diagnosis: Other abnormalities of gait and mobility (R26.89);Other symptoms and signs involving the nervous system (R29.898);Hemiplegia and hemiparesis Hemiplegia - Right/Left: Right Hemiplegia - dominant/non-dominant: Dominant Hemiplegia - caused by: Cerebral infarction;Other Nontraumatic intracranial hemorrhage     Time: 1610-9604 PT Time Calculation (min) (ACUTE ONLY): 14 min  Charges:    $Therapeutic Activity: 8-22 mins PT General Charges $$ ACUTE PT VISIT: 1 Visit  Kathlyn Sacramento, PT, DPT [facility]  Rehabilitation Services Physical Therapist Office: [phone] Website: [email or web address]  Addended for time in-out; now correct.  Berton Mount 11/01/2022, 1:08 PM

## 2022-11-02 ENCOUNTER — Inpatient Hospital Stay (HOSPITAL_COMMUNITY): Payer: Medicare PPO

## 2022-11-02 DIAGNOSIS — I253 Aneurysm of heart: Secondary | ICD-10-CM | POA: Diagnosis not present

## 2022-11-02 DIAGNOSIS — R1310 Dysphagia, unspecified: Secondary | ICD-10-CM | POA: Diagnosis not present

## 2022-11-02 DIAGNOSIS — I10 Essential (primary) hypertension: Secondary | ICD-10-CM | POA: Diagnosis not present

## 2022-11-02 DIAGNOSIS — I5023 Acute on chronic systolic (congestive) heart failure: Secondary | ICD-10-CM | POA: Diagnosis not present

## 2022-11-02 LAB — CBC WITH DIFFERENTIAL/PLATELET
Abs Immature Granulocytes: 0.03 10*3/uL (ref 0.00–0.07)
Basophils Absolute: 0 10*3/uL (ref 0.0–0.1)
Basophils Relative: 0 %
Eosinophils Absolute: 0.1 10*3/uL (ref 0.0–0.5)
Eosinophils Relative: 2 %
HCT: 35.5 % — ABNORMAL LOW (ref 39.0–52.0)
Hemoglobin: 12.1 g/dL — ABNORMAL LOW (ref 13.0–17.0)
Immature Granulocytes: 1 %
Lymphocytes Relative: 28 %
Lymphs Abs: 1.8 10*3/uL (ref 0.7–4.0)
MCH: 29.8 pg (ref 26.0–34.0)
MCHC: 34.1 g/dL (ref 30.0–36.0)
MCV: 87.4 fL (ref 80.0–100.0)
Monocytes Absolute: 0.6 10*3/uL (ref 0.1–1.0)
Monocytes Relative: 9 %
Neutro Abs: 4 10*3/uL (ref 1.7–7.7)
Neutrophils Relative %: 60 %
Platelets: 121 10*3/uL — ABNORMAL LOW (ref 150–400)
RBC: 4.06 MIL/uL — ABNORMAL LOW (ref 4.22–5.81)
RDW: 12.5 % (ref 11.5–15.5)
WBC: 6.6 10*3/uL (ref 4.0–10.5)
nRBC: 0 % (ref 0.0–0.2)

## 2022-11-02 LAB — GLUCOSE, CAPILLARY
Glucose-Capillary: 88 mg/dL (ref 70–99)
Glucose-Capillary: 115 mg/dL — ABNORMAL HIGH (ref 70–99)
Glucose-Capillary: 101 mg/dL — ABNORMAL HIGH (ref 70–99)
Glucose-Capillary: 112 mg/dL — ABNORMAL HIGH (ref 70–99)

## 2022-11-02 LAB — BASIC METABOLIC PANEL
Anion gap: 9 (ref 5–15)
BUN: 18 mg/dL (ref 8–23)
CO2: 25 mmol/L (ref 22–32)
Calcium: 9 mg/dL (ref 8.9–10.3)
Chloride: 97 mmol/L — ABNORMAL LOW (ref 98–111)
Creatinine, Ser: 0.84 mg/dL (ref 0.61–1.24)
GFR, Estimated: >60 mL/min (ref >60)
Glucose, Bld: 131 mg/dL — ABNORMAL HIGH (ref 70–99)
Potassium: 3.9 mmol/L (ref 3.5–5.1)
Sodium: 131 mmol/L — ABNORMAL LOW (ref 135–145)

## 2022-11-02 NOTE — TOC Progression Note (Signed)
Transition of Care [facility]) - Progression Note    Patient Details  Name: [name] MRN: [id number] Date of Birth: June 17, 1960  Transition of Care [facility]) CM/SW Contact  Baldemar Lenis, [place] Phone Number: 11/02/2022, 12:46 PM  Clinical Narrative:   CSW left another voicemail for son asking for return call to discuss SNF choice, awaiting call back.    Expected Discharge Plan: Skilled Nursing Facility Barriers to Discharge: Continued Medical Work up, English as a second language teacher, SNF Pending bed offer  Expected Discharge Plan and Services In-house Referral: Clinical Social Work     Living arrangements for the past 2 months: Apartment                                       Social Determinants of Health (SDOH) Interventions SDOH Screenings   Food Insecurity: Patient Unable To Answer (10/11/2022)  Housing: Patient Unable To Answer (10/11/2022)  Transportation Needs: Patient Unable To Answer (10/11/2022)  Utilities: Patient Unable To Answer (10/11/2022)  Depression (PHQ2-9): Low Risk  (05/08/2022)  Social Connections: Unknown (08/28/2021)   Received from [facility]  Tobacco Use: High Risk (10/29/2022)    Readmission Risk Interventions     No data to display

## 2022-11-02 NOTE — Progress Notes (Signed)
Mobility Specialist Progress Note   11/02/22 1639  Mobility  Activity Turned to back - supine (Bed Level Exercises)  Level of Assistance Moderate assist, patient does 50-74%  Assistive Device Other (Comment) (HHA)  Range of Motion/Exercises All extremities  Activity Response Tolerated well  Mobility Referral Yes  $Mobility charge 1 Mobility  Mobility Specialist Start Time (ACUTE ONLY) 1610  Mobility Specialist Stop Time (ACUTE ONLY) 1640  Mobility Specialist Time Calculation (min) (ACUTE ONLY) 30 min   Pt found in bed motivated to participate in mobility today. Performed bed level exercises w/ pt demonstrating decent ROM in L sided extremities to completion. Pt able to activate R side but limited by deficit and requiring guided ROM to complete. Pt in deconditioned state and becoming fatigued quickly limiting amount of exercises during session. No faults during exercises, left in chair position in bed w/ call bell in reach and bed alarm on.   Frederico Hamman Mobility Specialist Please contact via SecureChat or  Rehab office at [phone]

## 2022-11-02 NOTE — Progress Notes (Signed)
[facility]                                                                               Jedd Schulenburg, is a 62 y.o. male, DOB - [birth year], ZOX:[id number] Admit date - 10/02/2022    Outpatient Primary MD for the patient is [name], MD  LOS - 31  days    Brief summary   Mr. Blyth is a 62 y.o. 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     Significant events: 6/18: Admitted for stroke, received TNK; developed right sided deficits overnight after TNK, underwent NIR thrombectomy 6/19: Post thrombectomy CT showed left frontal IPH and SAH 6/20: Heparin started cautiously 6/22: CT showed progression of hematomas, heparin stopped 6/23: Repeat echo shows dehiscence of prior pseudoaneurysm patch with adherent LV thrombus; Cardiology consulted 6/26: ID consulted for MSSE bacteremia 6/28: EP consulted, recommended not to take out ICD; CT stabilized, heparin restarted 7/2: Transferred OOU 7/10: Placed PEG tube 7/11: Transitioned to basal bolus feedings and switched to Eliquis 7/13: Increased HA, repeat CT shows progression of ICHs; Eliquis held 7/15: TEE negative, antibiotics stopped    Significant studies: 6/18 CTA head and neck: no LVO 6/18 repeat CTA head and neck: new distal left MCA M2 occlusion 6/19 Echo: poor quality images, nondiagnostic 6/19 TCD: positive study, indicating R>L shunt 6/19 LE doppler: no DVT 6/19 CT head: left frontal parietal IPH and adjacent [facility] 6/22 CT head: size now 8x4x4cm, with new 2cm component 6/23 complete echo: now shows recurrence of pseudoaneurysm, dehiscence of patch with adherent thrombus 6/25 coronary CTA: confirms above 6/28 CT head: stable large left frontoparietal hematoma 7/13: CT head shows worsening ICH 7/15 TEE: shows pseudoaneurysm with adherent clot, no  valvular vegetations, no signs of vegetation on ICD leads 7/16: CT head shows regression of ICH noted 7/13   Significant microbiology data: 6/18 MRSA nares: Negative 6/25 Blood cultures: MSSE in 2/2 6/27 blood cultures: NG in 2/2    Procedures: 6/18: TNK at 1246 6/19: Thrombectomy by Dr. Tommie Sams 6/19: Cortrak placed 7/10: PEG placed 7/15: TEE    Consults: Cardiology Infectious disease Electrophysiology Critical Care Palliative Care General Surgery    Assessment & Plan    Assessment and Plan:    Stroke/intraparenchymal hematoma/subarachnoid hemorrhage Cardioembolic from pseudoaneurysm S/p TNK. Patient had another stroke later the same day showing left M2 occlusion, underwent thrombectomy. Complicated by ICH and subarachnoid hemorrhage. Currently on Eliquis. CT head this morning done as he reported some headache. It shows stable ICH.  He is medically stable for discharge.    Pseudoaneurysm of the left ventricle  Echo showed dehiscence of patch with an indurated thrombus. Cardiology consulted recommended long-term anticoagulation. Possible repeat pseudoaneurysm repair if patient has neurological recovery.   Acute on chronic systolic heart failure Patient appears euvolemic Continue with Lasix, Entresto, beta-blockers and spironolactone.   Bacteremia secondary to gram-positive bacteria Completed 2 weeks of IV cefazolin ID consulted TEE does not show vegetations or any complications with ICD  leads.   Hypertension Blood pressure parameters are  well controlled.    Dysphagia Status post PEG placement on 7/10   Hyponatremia;  Monitor.   Cerebral edema S/p hypertonic saline   Malnutrition Type:  Nutrition Problem: Inadequate oral intake Etiology: inability to eat   Malnutrition Characteristics:  Signs/Symptoms: NPO status   Nutrition Interventions:  Interventions: MVI, Tube feeding  Estimated body mass index is 28.97 kg/m as  calculated from the following:   Height as of this encounter: 6\' 1"  (1.854 m).   Weight as of this encounter: 99.6 kg.  Code Status: DNR DVT Prophylaxis:   apixaban (ELIQUIS) tablet 5 mg   Level of Care: Level of care: Telemetry Medical Family Communication: NONE AT BEDSIDE.  Disposition Plan:     Remains inpatient appropriate:  waiting for SNF PLACEMENT.   Antimicrobials:   Anti-infectives (From admission, onward)    Start     Dose/Rate Route Frequency Ordered Stop   10/11/22 1515  cefTRIAXone (ROCEPHIN) 2 g in sodium chloride 0.9 % 100 mL IVPB  Status:  Discontinued        2 g 200 mL/hr over 30 Minutes Intravenous Every 24 hours 10/10/22 1934 10/10/22 1939   10/11/22 1515  cefTRIAXone (ROCEPHIN) 2 g in sodium chloride 0.9 % 100 mL IVPB  Status:  Discontinued        2 g 200 mL/hr over 30 Minutes Intravenous Every 24 hours 10/10/22 1939 10/11/22 0857   10/11/22 0945  ceFAZolin (ANCEF) IVPB 2g/100 mL premix  Status:  Discontinued        2 g 200 mL/hr over 30 Minutes Intravenous Every 8 hours 10/11/22 0857 10/29/22 1327   10/10/22 2030  cefTRIAXone (ROCEPHIN) 1 g in sodium chloride 0.9 % 100 mL IVPB  Status:  Discontinued        1 g 200 mL/hr over 30 Minutes Intravenous  Once 10/10/22 1934 10/10/22 1939   10/10/22 2030  cefTRIAXone (ROCEPHIN) 1 g in sodium chloride 0.9 % 100 mL IVPB        1 g 200 mL/hr over 30 Minutes Intravenous  Once 10/10/22 1939 10/10/22 2158   10/09/22 1428  cefTRIAXone (ROCEPHIN) 1 g in sodium chloride 0.9 % 100 mL IVPB  Status:  Discontinued        1 g 200 mL/hr over 30 Minutes Intravenous Every 24 hours 10/09/22 1428 10/10/22 1934   10/09/22 1145  cefTRIAXone (ROCEPHIN) 1 g in sodium chloride 0.9 % 100 mL IVPB  Status:  Discontinued        1 g 200 mL/hr over 30 Minutes Intravenous Every 24 hours 10/09/22 1048 10/09/22 1428        Medications  Scheduled Meds:  apixaban  5 mg Oral BID   atorvastatin  80 mg Oral Daily   famotidine  20 mg Oral BID    feeding supplement (JEVITY 1.5 CAL/FIBER)  415 mL Per Tube TID PC & HS   fiber supplement (BANATROL TF)  60 mL Per Tube BID   free water  60 mL Per Tube TID PC & HS   gabapentin  300 mg Oral QHS   Gerhardt's butt cream   Topical BID   insulin aspart  0-15 Units Subcutaneous TID WC   insulin aspart  0-5 Units Subcutaneous QHS   multivitamin with minerals  1 tablet Oral Daily   nicotine  7 mg Transdermal Daily   polyethylene glycol  17 g Oral BID   spironolactone  12.5 mg Oral Daily  Continuous Infusions:  sodium chloride 10 mL/hr at 10/22/22 1331   PRN Meds:.sodium chloride, acetaminophen **OR** acetaminophen (TYLENOL) oral liquid 160 mg/5 mL **OR** acetaminophen, metoprolol tartrate, ondansetron (ZOFRAN) IV, mouth rinse, pneumococcal 20-valent conjugate vaccine    Subjective:   Shamere Campas was seen and examined today. Slight headache, no nausea or vomiting.   Objective:   Vitals:   11/02/22 0500 11/02/22 0809 11/02/22 1135 11/02/22 1501  BP:  114/78 105/81 110/76  Pulse:  84 80 96  Resp:   20 20  Temp:  98.7 F (37.1 C) 97.6 F (36.4 C) 97.7 F (36.5 C)  TempSrc:  Oral Oral Oral  SpO2:  99% 100% 97%  Weight: 99.6 kg     Height:        Intake/Output Summary (Last 24 hours) at 11/02/2022 1558 Last data filed at 11/02/2022 1040 Gross per 24 hour  Intake 180 ml  Output 1550 ml  Net -1370 ml   Filed Weights   10/31/22 0507 11/01/22 0500 11/02/22 0500  Weight: 98.4 kg 99.8 kg 99.6 kg     Exam General exam: Appears calm and comfortable  Respiratory system: Clear to auscultation. Respiratory effort normal. Cardiovascular system: S1 & S2 heard, RRR. No JVD,  Gastrointestinal system: Abdomen is nondistended, soft and nontender.  Central nervous system: Alert , speech abnormalities.  Extremities: Symmetric 5 x 5 power. Skin: No rashes,  Psychiatry:  Mood & affect appropriate.     Data Reviewed:  I have personally reviewed following labs and imaging  studies   CBC Lab Results  Component Value Date   WBC 6.6 11/02/2022   RBC 4.06 (L) 11/02/2022   HGB 12.1 (L) 11/02/2022   HCT 35.5 (L) 11/02/2022   MCV 87.4 11/02/2022   MCH 29.8 11/02/2022   PLT 121 (L) 11/02/2022   MCHC 34.1 11/02/2022   RDW 12.5 11/02/2022   LYMPHSABS 1.8 11/02/2022   MONOABS 0.6 11/02/2022   EOSABS 0.1 11/02/2022   BASOSABS 0.0 11/02/2022     Last metabolic panel Lab Results  Component Value Date   NA 131 (L) 11/02/2022   K 3.9 11/02/2022   CL 97 (L) 11/02/2022   CO2 25 11/02/2022   BUN 18 11/02/2022   CREATININE 0.84 11/02/2022   GLUCOSE 131 (H) 11/02/2022   GFRNONAA >60 11/02/2022   GFRAA 92 05/06/2020   CALCIUM 9.0 11/02/2022   PHOS 3.9 10/16/2022   PROT 6.2 (L) 11/01/2022   ALBUMIN 3.0 (L) 11/01/2022   LABGLOB 2.4 05/25/2021   AGRATIO 1.8 05/25/2021   BILITOT 0.6 11/01/2022   ALKPHOS 97 11/01/2022   AST 29 11/01/2022   ALT 24 11/01/2022   ANIONGAP 9 11/02/2022    CBG (last 3)  Recent Labs    11/01/22 2139 11/02/22 0644 11/02/22 1221  GLUCAP 138* 88 115*      Coagulation Profile: No results for input(s): "INR", "PROTIME" in the last 168 hours.   Radiology Studies: CT HEAD WO CONTRAST ( )  Result Date: 11/02/2022 CLINICAL DATA:  Headache, increasing frequency or severity h/o ICH, on eliquis. EXAM: CT HEAD WITHOUT CONTRAST TECHNIQUE: Contiguous axial images were obtained from the base of the skull through the vertex without intravenous contrast. RADIATION DOSE REDUCTION: This exam was performed according to the departmental dose-optimization program which includes automated exposure control, adjustment of the mA and/or kV according to patient size and/or use of iterative reconstruction technique. COMPARISON:  CT head 10/30/2022. FINDINGS: Brain: Similar small volume of intraventricular hemorrhage layering in the occipital horns.  Left frontoparietal intraparenchymal hematoma is similar and size and slightly decreased in  attenuation. The subacute intraparenchymal hemorrhage in the posterior limb of the left internal capsule is also not substantially changed. Similar multifocal encephalomalacia involving bilateral cerebellar hemispheres and cerebral hemispheres. Trace subarachnoid hemorrhage at the vertex is similar. No hydrocephalus or midline shift. Vascular: No hyperdense vessel or unexpected calcification. Skull: No acute fracture. Sinuses/Orbits: No acute abnormality. Other: No mastoid effusions. IMPRESSION: Intraparenchymal, intraventricular and subarachnoid hemorrhage is similar. Electronically Signed   By: Feliberto Harts M.D.   On: 11/02/2022 13:32       Kathlen Mody M.D. [facility] 11/02/2022, 3:58 PM  Available via Epic secure chat 7am-7pm After 7 pm, please refer to night coverage provider listed on amion.

## 2022-11-02 NOTE — Progress Notes (Signed)
1252 Pt has left floor for CT  1324 Pt has returned to floor from CT

## 2022-11-03 DIAGNOSIS — I6300 Cerebral infarction due to thrombosis of unspecified precerebral artery: Secondary | ICD-10-CM | POA: Diagnosis not present

## 2022-11-03 LAB — GLUCOSE, CAPILLARY
Glucose-Capillary: 91 mg/dL (ref 70–99)
Glucose-Capillary: 79 mg/dL (ref 70–99)
Glucose-Capillary: 147 mg/dL — ABNORMAL HIGH (ref 70–99)
Glucose-Capillary: 186 mg/dL — ABNORMAL HIGH (ref 70–99)
Glucose-Capillary: 119 mg/dL — ABNORMAL HIGH (ref 70–99)

## 2022-11-03 MED ORDER — DIPHENHYDRAMINE-ZINC ACETATE 2-0.1 % EX CREA
TOPICAL_CREAM | Freq: Three times a day (TID) | CUTANEOUS | Status: DC | PRN
  Filled 2022-11-03 (×2): qty 28

## 2022-11-03 MED ORDER — CARVEDILOL 3.125 MG PO TABS
3.1250 mg | ORAL_TABLET | Freq: Two times a day (BID) | ORAL | Status: DC
  Administered 2022-11-04 – 2022-11-16 (×26): 3.125 mg via ORAL
  Filled 2022-11-03 (×26): qty 1

## 2022-11-03 MED ORDER — NYSTATIN 100000 UNIT/GM EX POWD
Freq: Three times a day (TID) | CUTANEOUS | Status: DC
  Filled 2022-11-03: qty 15

## 2022-11-03 NOTE — Progress Notes (Signed)
Progress Note   Patient: [name] DGU:[id number] DOB: Oct 11, 1960 DOA: 10/02/2022     32 DOS: the patient was seen and examined on 11/03/2022 at 8:55 AM      Brief hospital course: [name] is a 62 y.o. 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     Significant events: 6/18: Admitted for stroke, received TNK; developed right sided deficits overnight after TNK, underwent NIR thrombectomy 6/19: Post thrombectomy CT showed left frontal IPH and SAH 6/20: Heparin started cautiously 6/22: CT showed progression of hematomas, heparin stopped 6/23: Repeat echo shows dehiscence of prior pseudoaneurysm patch with adherent LV thrombus; Cardiology consulted 6/26: ID consulted for MSSE bacteremia 6/28: EP consulted, recommended not to take out ICD; CT stabilized, heparin restarted 7/2: Transferred OOU 7/10: Placed PEG tube 7/11: Transitioned to basal bolus feedings and switched to Eliquis 7/13: Increased HA, repeat CT shows progression of ICHs; Eliquis held 7/15: TEE negative, antibiotics stopped 7/17: Eliquis restarted    Significant studies: 6/18 CTA head and neck: no LVO 6/18 repeat CTA head and neck: new distal left MCA M2 occlusion 6/19 Echo: poor quality images, nondiagnostic 6/19 TCD: positive study, indicating R>L shunt 6/19 LE doppler: no DVT 6/19 CT head: left frontal parietal IPH and adjacent [facility] 6/22 CT head: size now 8x4x4cm, with new 2cm component 6/23 complete echo: now shows recurrence of pseudoaneurysm, dehiscence of patch with adherent thrombus 6/25 coronary CTA: confirms above 6/28 CT head: stable large left frontoparietal hematoma 7/13: CT head shows worsening ICH 7/15 TEE: shows pseudoaneurysm with adherent clot, no valvular vegetations, no signs of vegetation on ICD leads 7/16: CT head shows regression  of ICH noted 7/13 7/19: CT head shows no change in ICH   Significant microbiology data: 6/18 MRSA nares: Negative 6/25 Blood cultures: MSSE in 2/2 6/27 blood cultures: NG in 2/2    Procedures: 6/18: TNK at 1246 6/19: Thrombectomy by Dr. Tommie Sams 6/19: Cortrak placed 7/10: PEG placed 7/15: TEE    Consults: Cardiology Infectious disease Electrophysiology Critical Care Palliative Care General Surgery      Assessment and Plan: * Stroke (cerebrum) (HCC) Intraparenchymal hematoma See summary from 7/17 - Stable on Eliquis - Continue atorvastatin    Bacteremia due to Gram-positive bacteria Treated with 2 weeks of IV cefazolin, ID consulted.  TEE showed no vegetations no complications with ICD leads, ID recommended completing antibiotics for 2 weeks.  No further fever.   Pseudoaneurysm of left ventricle of heart See summary from 7/17.  Patient will need long-term anticoagulation follow-up with cardiology to determine if he is a candidate down the road for repeat repair.   Acute on chronic systolic CHF (congestive heart failure) (HCC) Treated and now appears euvolemic.  Entresto, beta-blocker, diuretics have been held due to soft blood pressure. - Hold Entresto, Lasix for now -Continue spironolactone - Resume Coreg - Resume lasix, Entresto as able  Essential hypertension - Hold Entresto, Lasix for now due to soft BP -Continue spironolactone - Resume Coreg  Dyslipidemia - Continue Lipitor  Dysphagia PEG placed 7/10. Doing well with oral intake - Continue reduced dose bolus tube feeds - Dietitian consult     Cerebral edema (HCC) Treated with hypertonic saline.  Stable and no further 3% saline required.  Prognosis poor.  Thrombocytopenia (HCC) Resolved.  Aspiration pneumonia (HCC) Treated with Rocephin  to cefazolin, now resolevd.  ICD (implantable cardioverter-defibrillator) in place   Obesity (BMI 30.0-34.9) BMI 32.2  Coronary artery  disease   Acute respiratory failure with hypoxia (HCC) On 6/19 and 6/20 was tachypneic to the high 20s and hypoxic, requiring 4L nasal cannula.  Due to aspiration pneumonia and CHF.  Treated and resolved.            Subjective: Patient has itchiness of his leg, no significant headache, chest pain, dyspnea.  Speech seems like it is improving.     Physical Exam: BP 138/88 (BP Location: Left Arm)   Pulse 72   Temp 98.6 F (37 C) (Axillary)   Resp 19   Ht 6\' 1"  (1.854 m)   Wt 99.6 kg   SpO2 99%   BMI 28.97 kg/m   Male, lying in bed, interactive and appropriate RRR, no murmurs, no peripheral edema Respiratory rate normal, lungs clear without rales or wheezes He has continued right-sided hemiparesis, and heavy dysarthria but overall the dysarthria is improving  Data Reviewed:   Family Communication: Called to significant other [name], no answer    Disposition: Status is: Inpatient The patient appears medically ready for discharge when a bed is available        Author: Alberteen Sam, MD 11/03/2022 3:43 PM  For on call review [email or web address].

## 2022-11-03 NOTE — Plan of Care (Signed)
Problem: Education: Goal: Knowledge of disease or condition will improve 11/03/2022 0536 by Luther Redo, RN Outcome: Progressing 11/03/2022 0535 by Luther Redo, RN Outcome: Progressing Goal: Knowledge of secondary prevention will improve (MUST DOCUMENT ALL) 11/03/2022 0536 by Luther Redo, RN Outcome: Progressing 11/03/2022 0535 by Luther Redo, RN Outcome: Progressing Goal: Knowledge of patient specific risk factors will improve [name] N/A or DELETE if not current risk factor) 11/03/2022 0536 by Luther Redo, RN Outcome: Progressing 11/03/2022 0535 by Luther Redo, RN Outcome: Progressing   Problem: Ischemic Stroke/TIA Tissue Perfusion: Goal: Complications of ischemic stroke/TIA will be minimized 11/03/2022 0536 by Luther Redo, RN Outcome: Progressing 11/03/2022 0535 by Luther Redo, RN Outcome: Progressing   Problem: Coping: Goal: Will verbalize positive feelings about self 11/03/2022 0536 by Luther Redo, RN Outcome: Progressing 11/03/2022 0535 by Luther Redo, RN Outcome: Progressing Goal: Will identify appropriate support needs 11/03/2022 0536 by Luther Redo, RN Outcome: Progressing 11/03/2022 0535 by Luther Redo, RN Outcome: Progressing   Problem: Health Behavior/Discharge Planning: Goal: Ability to manage health-related needs will improve 11/03/2022 0536 by Luther Redo, RN Outcome: Progressing 11/03/2022 0535 by Luther Redo, RN Outcome: Progressing Goal: Goals will be collaboratively established with patient/family 11/03/2022 0536 by Luther Redo, RN Outcome: Progressing 11/03/2022 0535 by Luther Redo, RN Outcome: Progressing   Problem: Self-Care: Goal: Ability to participate in self-care as condition permits will improve 11/03/2022 0536 by Luther Redo, RN Outcome: Progressing 11/03/2022 0535 by Luther Redo, RN Outcome: Progressing Goal: Verbalization of  feelings and concerns over difficulty with self-care will improve 11/03/2022 0536 by Luther Redo, RN Outcome: Progressing 11/03/2022 0535 by Luther Redo, RN Outcome: Progressing Goal: Ability to communicate needs accurately will improve 11/03/2022 0536 by Luther Redo, RN Outcome: Progressing 11/03/2022 0535 by Luther Redo, RN Outcome: Progressing   Problem: Nutrition: Goal: Risk of aspiration will decrease 11/03/2022 0536 by Luther Redo, RN Outcome: Progressing 11/03/2022 0535 by Luther Redo, RN Outcome: Progressing Goal: Dietary intake will improve 11/03/2022 0536 by Luther Redo, RN Outcome: Progressing 11/03/2022 0535 by Luther Redo, RN Outcome: Progressing   Problem: Education: Goal: Knowledge of General Education information will improve Description: Including pain rating scale, medication(s)/side effects and non-pharmacologic comfort measures 11/03/2022 0536 by Luther Redo, RN Outcome: Progressing 11/03/2022 0535 by Luther Redo, RN Outcome: Progressing   Problem: Health Behavior/Discharge Planning: Goal: Ability to manage health-related needs will improve 11/03/2022 0536 by Luther Redo, RN Outcome: Progressing 11/03/2022 0535 by Luther Redo, RN Outcome: Progressing   Problem: Clinical Measurements: Goal: Ability to maintain clinical measurements within normal limits will improve 11/03/2022 0536 by Luther Redo, RN Outcome: Progressing 11/03/2022 0535 by Luther Redo, RN Outcome: Progressing Goal: Will remain free from infection 11/03/2022 0536 by Luther Redo, RN Outcome: Progressing 11/03/2022 0535 by Luther Redo, RN Outcome: Progressing Goal: Diagnostic test results will improve 11/03/2022 0536 by Luther Redo, RN Outcome: Progressing 11/03/2022 0535 by Luther Redo, RN Outcome: Progressing Goal: Respiratory complications will improve 11/03/2022 0536 by  Luther Redo, RN Outcome: Progressing 11/03/2022 0535 by Luther Redo, RN Outcome: Progressing Goal: Cardiovascular complication will be avoided 11/03/2022 0536 by Luther Redo, RN Outcome: Progressing 11/03/2022 0535 by Luther Redo, RN Outcome: Progressing   Problem: Activity: Goal: Risk for activity intolerance will decrease 11/03/2022 0536 by Luther Redo, RN Outcome: Progressing 11/03/2022 0535 by Luther Redo, RN Outcome: Progressing   Problem: Nutrition: Goal: Adequate nutrition will be maintained 11/03/2022 0536 by Luther Redo, RN Outcome: Progressing 11/03/2022 0535 by Luther Redo, RN Outcome: Progressing   Problem: Coping: Goal: Level of  anxiety will decrease 11/03/2022 0536 by Luther Redo, RN Outcome: Progressing 11/03/2022 0535 by Luther Redo, RN Outcome: Progressing   Problem: Elimination: Goal: Will not experience complications related to bowel motility 11/03/2022 0536 by Luther Redo, RN Outcome: Progressing 11/03/2022 0535 by Luther Redo, RN Outcome: Progressing Goal: Will not experience complications related to urinary retention 11/03/2022 0536 by Luther Redo, RN Outcome: Progressing 11/03/2022 0535 by Luther Redo, RN Outcome: Progressing   Problem: Pain Managment: Goal: General experience of comfort will improve 11/03/2022 0536 by Luther Redo, RN Outcome: Progressing 11/03/2022 0535 by Luther Redo, RN Outcome: Progressing   Problem: Safety: Goal: Ability to remain free from injury will improve 11/03/2022 0536 by Luther Redo, RN Outcome: Progressing 11/03/2022 0535 by Luther Redo, RN Outcome: Progressing   Problem: Skin Integrity: Goal: Risk for impaired skin integrity will decrease 11/03/2022 0536 by Luther Redo, RN Outcome: Progressing 11/03/2022 0535 by Luther Redo, RN Outcome: Progressing   Problem: Education: Goal:  Understanding of CV disease, CV risk reduction, and recovery process will improve 11/03/2022 0536 by Luther Redo, RN Outcome: Progressing 11/03/2022 0535 by Luther Redo, RN Outcome: Progressing Goal: Individualized Educational Video(s) 11/03/2022 0536 by Luther Redo, RN Outcome: Progressing 11/03/2022 0535 by Luther Redo, RN Outcome: Progressing   Problem: Activity: Goal: Ability to return to baseline activity level will improve 11/03/2022 0536 by Luther Redo, RN Outcome: Progressing 11/03/2022 0535 by Luther Redo, RN Outcome: Progressing   Problem: Cardiovascular: Goal: Ability to achieve and maintain adequate cardiovascular perfusion will improve 11/03/2022 0536 by Luther Redo, RN Outcome: Progressing 11/03/2022 0535 by Luther Redo, RN Outcome: Progressing Goal: Vascular access site(s) Level 0-1 will be maintained 11/03/2022 0536 by Luther Redo, RN Outcome: Progressing 11/03/2022 0535 by Luther Redo, RN Outcome: Progressing   Problem: Health Behavior/Discharge Planning: Goal: Ability to safely manage health-related needs after discharge will improve 11/03/2022 0536 by Luther Redo, RN Outcome: Progressing 11/03/2022 0535 by Luther Redo, RN Outcome: Progressing

## 2022-11-03 NOTE — Plan of Care (Signed)
  Problem: Education: Goal: Knowledge of disease or condition will improve Outcome: Progressing Goal: Knowledge of secondary prevention will improve (MUST DOCUMENT ALL) Outcome: Progressing Goal: Knowledge of patient specific risk factors will improve (Mark N/A or DELETE if not current risk factor) Outcome: Progressing   Problem: Ischemic Stroke/TIA Tissue Perfusion: Goal: Complications of ischemic stroke/TIA will be minimized Outcome: Progressing   Problem: Coping: Goal: Will verbalize positive feelings about self Outcome: Progressing Goal: Will identify appropriate support needs Outcome: Progressing   Problem: Health Behavior/Discharge Planning: Goal: Ability to manage health-related needs will improve Outcome: Progressing Goal: Goals will be collaboratively established with patient/family Outcome: Progressing   Problem: Self-Care: Goal: Ability to participate in self-care as condition permits will improve Outcome: Progressing Goal: Verbalization of feelings and concerns over difficulty with self-care will improve Outcome: Progressing Goal: Ability to communicate needs accurately will improve Outcome: Progressing   Problem: Nutrition: Goal: Risk of aspiration will decrease Outcome: Progressing Goal: Dietary intake will improve Outcome: Progressing   Problem: Education: Goal: Knowledge of General Education information will improve Description: Including pain rating scale, medication(s)/side effects and non-pharmacologic comfort measures Outcome: Progressing   Problem: Health Behavior/Discharge Planning: Goal: Ability to manage health-related needs will improve Outcome: Progressing   Problem: Clinical Measurements: Goal: Ability to maintain clinical measurements within normal limits will improve Outcome: Progressing Goal: Will remain free from infection Outcome: Progressing Goal: Diagnostic test results will improve Outcome: Progressing Goal: Respiratory  complications will improve Outcome: Progressing Goal: Cardiovascular complication will be avoided Outcome: Progressing   Problem: Activity: Goal: Risk for activity intolerance will decrease Outcome: Progressing   Problem: Nutrition: Goal: Adequate nutrition will be maintained Outcome: Progressing   Problem: Coping: Goal: Level of anxiety will decrease Outcome: Progressing   Problem: Elimination: Goal: Will not experience complications related to bowel motility Outcome: Progressing Goal: Will not experience complications related to urinary retention Outcome: Progressing   Problem: Pain Managment: Goal: General experience of comfort will improve Outcome: Progressing   Problem: Safety: Goal: Ability to remain free from injury will improve Outcome: Progressing   Problem: Skin Integrity: Goal: Risk for impaired skin integrity will decrease Outcome: Progressing   Problem: Education: Goal: Understanding of CV disease, CV risk reduction, and recovery process will improve Outcome: Progressing Goal: Individualized Educational Video(s) Outcome: Progressing   Problem: Activity: Goal: Ability to return to baseline activity level will improve Outcome: Progressing   Problem: Cardiovascular: Goal: Ability to achieve and maintain adequate cardiovascular perfusion will improve Outcome: Progressing Goal: Vascular access site(s) Level 0-1 will be maintained Outcome: Progressing   Problem: Health Behavior/Discharge Planning: Goal: Ability to safely manage health-related needs after discharge will improve Outcome: Progressing   

## 2022-11-04 DIAGNOSIS — I253 Aneurysm of heart: Secondary | ICD-10-CM | POA: Diagnosis not present

## 2022-11-04 DIAGNOSIS — G936 Cerebral edema: Secondary | ICD-10-CM | POA: Diagnosis not present

## 2022-11-04 DIAGNOSIS — R7881 Bacteremia: Secondary | ICD-10-CM | POA: Diagnosis not present

## 2022-11-04 LAB — COMPREHENSIVE METABOLIC PANEL
ALT: 29 U/L (ref 0–44)
AST: 36 U/L (ref 15–41)
Albumin: 3.2 g/dL — ABNORMAL LOW (ref 3.5–5.0)
Alkaline Phosphatase: 110 U/L (ref 38–126)
Anion gap: 11 (ref 5–15)
BUN: 17 mg/dL (ref 8–23)
CO2: 22 mmol/L (ref 22–32)
Calcium: 8.9 mg/dL (ref 8.9–10.3)
Chloride: 99 mmol/L (ref 98–111)
Creatinine, Ser: 0.82 mg/dL (ref 0.61–1.24)
GFR, Estimated: >60 mL/min (ref >60)
Glucose, Bld: 101 mg/dL — ABNORMAL HIGH (ref 70–99)
Potassium: 5.1 mmol/L (ref 3.5–5.1)
Sodium: 132 mmol/L — ABNORMAL LOW (ref 135–145)
Total Bilirubin: 0.8 mg/dL (ref 0.3–1.2)
Total Protein: 6.3 g/dL — ABNORMAL LOW (ref 6.5–8.1)

## 2022-11-04 LAB — GLUCOSE, CAPILLARY
Glucose-Capillary: 120 mg/dL — ABNORMAL HIGH (ref 70–99)
Glucose-Capillary: 115 mg/dL — ABNORMAL HIGH (ref 70–99)
Glucose-Capillary: 94 mg/dL (ref 70–99)
Glucose-Capillary: 139 mg/dL — ABNORMAL HIGH (ref 70–99)

## 2022-11-04 LAB — CBC
HCT: 34.1 % — ABNORMAL LOW (ref 39.0–52.0)
Hemoglobin: 11.4 g/dL — ABNORMAL LOW (ref 13.0–17.0)
MCH: 29.5 pg (ref 26.0–34.0)
MCHC: 33.4 g/dL (ref 30.0–36.0)
MCV: 88.1 fL (ref 80.0–100.0)
Platelets: 141 10*3/uL — ABNORMAL LOW (ref 150–400)
RBC: 3.87 MIL/uL — ABNORMAL LOW (ref 4.22–5.81)
RDW: 12.6 % (ref 11.5–15.5)
WBC: 5.8 10*3/uL (ref 4.0–10.5)
nRBC: 0.3 % — ABNORMAL HIGH (ref 0.0–0.2)

## 2022-11-04 MED ORDER — FLUOXETINE HCL 10 MG PO CAPS
10.0000 mg | ORAL_CAPSULE | Freq: Every day | ORAL | Status: DC
  Administered 2022-11-04 – 2022-11-16 (×13): 10 mg via ORAL
  Filled 2022-11-04 (×13): qty 1

## 2022-11-04 NOTE — Progress Notes (Signed)
PROGRESS NOTE    [name]  AOZ:[id number] DOB: [birth year] DOA: 10/02/2022 PCP: [name], MD    Brief Narrative:  [name] is a 62 y.o. 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  Remains in the hospital.  Pending placement.  Hospital course complicated by hemorrhagic conversion of the stroke.  6/18: Admitted for stroke, received TNK; developed right sided deficits overnight after TNK, underwent NIR thrombectomy 6/19: Post thrombectomy CT showed left frontal IPH and SAH 6/20: Heparin started cautiously 6/22: CT showed progression of hematomas, heparin stopped 6/23: Repeat echo shows dehiscence of prior pseudoaneurysm patch with adherent LV thrombus; Cardiology consulted 6/26: ID consulted for MSSE bacteremia 6/28: EP consulted, recommended not to take out ICD; CT stabilized, heparin restarted 7/2: Transferred [facility] 7/10: Placed PEG tube 7/11: Transitioned to basal bolus feedings and switched to Eliquis 7/13: Increased HA, repeat CT shows progression of ICHs; Eliquis held 7/15: TEE negative, antibiotics stopped 7/17: Eliquis restarted  Significant studies: 6/18 CTA head and neck: no LVO 6/18 repeat CTA head and neck: new distal left MCA M2 occlusion 6/19 Echo: poor quality images, nondiagnostic 6/19 TCD: positive study, indicating R>L shunt 6/19 LE doppler: no DVT 6/19 CT head: left frontal parietal IPH and adjacent [facility] 6/22 CT head: size now 8x4x4cm, with new 2cm component 6/23 complete echo: now shows recurrence of pseudoaneurysm, dehiscence of patch with adherent thrombus 6/25 coronary CTA: confirms above 6/28 CT head: stable large left frontoparietal hematoma 7/13: CT head shows worsening ICH 7/15 TEE: shows pseudoaneurysm with adherent clot, no valvular vegetations, no signs of vegetation  on ICD leads 7/16: CT head shows regression of ICH noted 7/13 7/19: CT head shows no change in ICH     Significant microbiology data: 6/18 MRSA nares: Negative 6/25 Blood cultures: MSSE in 2/2 6/27 blood cultures: NG in 2/2     Procedures: 6/18: TNK at 1246 6/19: Thrombectomy by Dr. Tommie Sams 6/19: Cortrak placed 7/10: PEG placed 7/15: TEE  Assessment & Plan:   Acute stroke with intraparenchymal hematoma: Patient admitted and underwent TNK.  Later same day had second stroke with left M2 occlusion and underwent thrombectomy.    Source of both likely cardioembolic from pseudoaneurysm.  Unfortunately, post-TNK/thrombectomy course complicated by ICH and SAH.   Heparin was started due to pseudoaneurysm but ICH size increased so this was held for 1 week.  Serial CTs eventually showed stabilization, transitioned to heparin again, and finally Eliquis. Continue Lipitor.   Bacteremia due to Gram-positive bacteria Treated with 2 weeks IV cefazolin.  ID consulted.  TEE showed no vegetations and no complications with ICD leads.  ID recommended stopping antibiotics.    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  Stable by echo as recently as Jan of this year.   After admission, Echo showed the patch dehisced and he had adherent thrombus.  Cardiology were consulted and recommended he would need long term anticoagulation and that ultimately, if his neurologic recovery allowed and he were felt to be healthy enough, repeat pseudoaneurysm repair would be indicated (a very high risk operation)    Acute on chronic systolic CHF (congestive heart failure) (HCC) Treated and now appears euvolemic - Continue Entresto, BB, spiro, Lasix   Essential hypertension -  Continue Entresto, Coreg, Lasix, metoprolol, spironolactone   Dyslipidemia - Continue Lipitor   Dysphagia PEG placed 7/10.   - Continue bolus feeds - SS correction  insulin with feeds    Cerebral edema (HCC) Treated with hypertonic saline.  Stable and no further 3% saline required.  Prognosis poor.   Thrombocytopenia (HCC) Resolved.   Aspiration pneumonia (HCC) Treated with Rocephin to cefazolin, now resolevd.   ICD (implantable cardioverter-defibrillator) in place  Groin itchiness: Without any lesions.  Local therapy with Benadryl and steroids.  Will use some fluoxetine.       DVT prophylaxis:  apixaban (ELIQUIS) tablet 5 mg   Code Status: DNR Family Communication: None at the bedside Disposition Plan: Status is: Inpatient Remains inpatient appropriate because: Unsafe discharge planning     Consultants:  Neurology Palliative care  Procedures:  Multiple procedures above  Antimicrobials:  Completed   Subjective: Patient seen in the morning rounds.  He points to his groin area and Complains of itchiness.  Multiple medications applied including Benadryl cream but did not help.  Objective: Vitals:   11/04/22 0421 11/04/22 0429 11/04/22 0820 11/04/22 1235  BP:   122/83 115/82  Pulse:   80 77  Resp:      Temp:   97.9 F (36.6 C) 97.9 F (36.6 C)  TempSrc:   Oral   SpO2:  99% 99% 99%  Weight: 102.3 kg     Height:        Intake/Output Summary (Last 24 hours) at 11/04/2022 1445 Last data filed at 11/03/2022 2056 Gross per 24 hour  Intake 60 ml  Output 900 ml  Net -840 ml   Filed Weights   11/01/22 0500 11/02/22 0500 11/04/22 0421  Weight: 99.8 kg 99.6 kg 102.3 kg    Examination:  General: Chronically sick looking.  Laying in bed.  Interactive.  Appropriately follows commands. Cardiovascular: S1-S2 normal.  Regular rate rhythm. Respiratory: Bilateral clear.  No added sounds. Gastrointestinal: Soft.  Nontender.  Bowel sound present. Ext: Right-sided hemiparesis, dysarthria.   Data Reviewed: I have personally reviewed following labs and imaging studies  CBC: Recent Labs  Lab 10/30/22 0605 11/01/22 0137  11/02/22 1105 11/04/22 0403  WBC 7.9 8.1 6.6 5.8  NEUTROABS  --   --  4.0  --   HGB 13.1 11.5* 12.1* 11.4*  HCT 39.1 33.4* 35.5* 34.1*  MCV 90.3 88.4 87.4 88.1  PLT 125* 115* 121* 141*   Basic Metabolic Panel: Recent Labs  Lab 10/30/22 0605 11/01/22 0137 11/02/22 1105 11/04/22 0403  NA 135 127* 131* 132*  K 4.0 4.6 3.9 5.1  CL 101 96* 97* 99  CO2 25 26 25 22   GLUCOSE 196* 84 131* 101*  BUN 39* 23 18 17   CREATININE 0.92 0.82 0.84 0.82  CALCIUM 8.7* 8.4* 9.0 8.9   GFR: Estimated Creatinine Clearance: 119 mL/min (by C-G formula based on SCr of 0.82 mg/dL). Liver Function Tests: Recent Labs  Lab 11/01/22 0137 11/04/22 0403  AST 29 36  ALT 24 29  ALKPHOS 97 110  BILITOT 0.6 0.8  PROT 6.2* 6.3*  ALBUMIN 3.0* 3.2*   No results for input(s): "LIPASE", "AMYLASE" in the last 168 hours. No results for input(s): "AMMONIA" in the last 168 hours. Coagulation Profile: No results for input(s): "INR", "PROTIME" in the last 168 hours. Cardiac Enzymes: No results for input(s): "CKTOTAL", "CKMB", "CKMBINDEX", "TROPONINI" in the last 168 hours. BNP (last 3 results) No results for input(s): "PROBNP" in the last 8760 hours. HbA1C:  No results for input(s): "HGBA1C" in the last 72 hours. CBG: Recent Labs  Lab 11/03/22 1136 11/03/22 1713 11/03/22 2109 11/04/22 0627 11/04/22 1139  GLUCAP 147* 186* 119* 120* 115*   Lipid Profile: No results for input(s): "CHOL", "HDL", "LDLCALC", "TRIG", "CHOLHDL", "LDLDIRECT" in the last 72 hours. Thyroid Function Tests: No results for input(s): "TSH", "T4TOTAL", "FREET4", "T3FREE", "THYROIDAB" in the last 72 hours. Anemia Panel: No results for input(s): "VITAMINB12", "FOLATE", "FERRITIN", "TIBC", "IRON", "RETICCTPCT" in the last 72 hours. Sepsis Labs: No results for input(s): "PROCALCITON", "LATICACIDVEN" in the last 168 hours.  No results found for this or any previous visit (from the past 240 hour(s)).       Radiology Studies: No  results found.      Scheduled Meds:  apixaban  5 mg Oral BID   atorvastatin  80 mg Oral Daily   carvedilol  3.125 mg Oral BID WC   famotidine  20 mg Oral BID   feeding supplement (JEVITY 1.5 CAL/FIBER)  415 mL Per Tube TID PC & HS   fiber supplement (BANATROL TF)  60 mL Per Tube BID   FLUoxetine  10 mg Oral Daily   free water  60 mL Per Tube TID PC & HS   gabapentin  300 mg Oral QHS   Gerhardt's butt cream   Topical BID   insulin aspart  0-15 Units Subcutaneous TID WC   insulin aspart  0-5 Units Subcutaneous QHS   multivitamin with minerals  1 tablet Oral Daily   nicotine  7 mg Transdermal Daily   nystatin   Topical TID   polyethylene glycol  17 g Oral BID   spironolactone  12.5 mg Oral Daily   Continuous Infusions:  sodium chloride 10 mL/hr at 10/22/22 1331     LOS: 33 days    Time spent: 35 minutes    Dorcas Carrow, MD [facility]

## 2022-11-05 DIAGNOSIS — I253 Aneurysm of heart: Secondary | ICD-10-CM | POA: Diagnosis not present

## 2022-11-05 DIAGNOSIS — G936 Cerebral edema: Secondary | ICD-10-CM | POA: Diagnosis not present

## 2022-11-05 DIAGNOSIS — R7881 Bacteremia: Secondary | ICD-10-CM | POA: Diagnosis not present

## 2022-11-05 LAB — GLUCOSE, CAPILLARY
Glucose-Capillary: 90 mg/dL (ref 70–99)
Glucose-Capillary: 106 mg/dL — ABNORMAL HIGH (ref 70–99)
Glucose-Capillary: 136 mg/dL — ABNORMAL HIGH (ref 70–99)
Glucose-Capillary: 119 mg/dL — ABNORMAL HIGH (ref 70–99)

## 2022-11-05 MED ORDER — IBUPROFEN 200 MG PO TABS
600.0000 mg | ORAL_TABLET | Freq: Four times a day (QID) | ORAL | Status: DC | PRN
  Administered 2022-11-05 – 2022-11-08 (×6): 600 mg via ORAL
  Filled 2022-11-05 (×7): qty 3

## 2022-11-05 NOTE — TOC Progression Note (Signed)
Transition of Care [facility]) - Initial/Assessment Note    Patient Details  Name: [name] MRN: [id number] Date of Birth: 17-Mar-1961  [facility]) CM/SW Contact:    Ralene Bathe, LCSW Phone Number: 11/05/2022, 10:48 AM  Clinical Narrative:                 CSW contacted the patient's son to present bed offers.  There was no answer.  LCSW left voicemail requesting a returned call.  TOC following.    Expected Discharge Plan: Skilled Nursing Facility Barriers to Discharge: Continued Medical Work up, English as a second language teacher, SNF Pending bed offer   Patient Goals and CMS Choice            Expected Discharge Plan and Services In-house Referral: Clinical Social Work     Living arrangements for the past 2 months: Apartment                                      Prior Living Arrangements/Services Living arrangements for the past 2 months: Apartment Lives with:: Significant Other Patient language and need for interpreter reviewed:: Yes Do you feel safe going back to the place where you live?: Yes      Need for Family Participation in Patient Care: Yes (Comment) Care giver support system in place?: No (comment)   Criminal Activity/Legal Involvement Pertinent to Current Situation/Hospitalization: No - Comment as needed  Activities of Daily Living Home Assistive Devices/Equipment: None ADL Screening (condition at time of admission) Patient's cognitive ability adequate to safely complete daily activities?: No Is the patient deaf or have difficulty hearing?: No Does the patient have difficulty seeing, even when wearing glasses/contacts?: No Does the patient have difficulty concentrating, remembering, or making decisions?: Yes Patient able to express need for assistance with ADLs?: No Does the patient have difficulty dressing or bathing?: Yes Independently performs ADLs?: No Communication: Dependent Is this a change from baseline?: Change from baseline,  expected to last >3 days Dressing (OT): Dependent Is this a change from baseline?: Change from baseline, expected to last >3 days Grooming: Dependent Is this a change from baseline?: Change from baseline, expected to last >3 days Feeding: Dependent Is this a change from baseline?: Change from baseline, expected to last >3 days Bathing: Dependent Is this a change from baseline?: Change from baseline, expected to last >3 days Toileting: Dependent Is this a change from baseline?: Change from baseline, expected to last >3days In/Out Bed: Dependent Is this a change from baseline?: Change from baseline, expected to last >3 days Walks in Home: Dependent Is this a change from baseline?: Change from baseline, expected to last >3 days Does the patient have difficulty walking or climbing stairs?: Yes Weakness of Legs: Both Weakness of Arms/Hands: Both  Permission Sought/Granted Permission sought to share information with : Facility Medical sales representative, Family Supports                Emotional Assessment Appearance:: Appears stated age Attitude/Demeanor/Rapport: Unable to Assess Affect (typically observed): Unable to Assess Orientation: :  (Follows commands) Alcohol / Substance Use: Not Applicable Psych Involvement: No (comment)  Admission diagnosis:  Stroke (cerebrum) [facility]) [I63.9] Patient Active Problem List   Diagnosis Date Noted   Aspiration pneumonia (HCC) 10/24/2022   Thrombocytopenia (HCC) 10/24/2022   Cerebral edema (HCC) 10/24/2022   Bacteremia due to Gram-positive bacteria 10/16/2022   Stroke (cerebrum) (HCC) 10/02/2022   Cerebellar stroke (  HCC) 08/06/2022   Cryptogenic stroke (HCC) 08/06/2022   At risk for obstructive sleep apnea 08/06/2022   Tobacco use disorder 08/06/2022   Abnormality of gait due to impairment of balance 08/06/2022   TIA (transient ischemic attack) 05/04/2022   Acute on chronic systolic CHF (congestive heart failure) (HCC) 05/04/2022    Expressive dysphasia 05/04/2022   Unsteady gait 05/04/2022   Near syncope 05/04/2022   Chronic systolic heart failure (HCC) 12/01/2021   ICD (implantable cardioverter-defibrillator) in place 12/01/2021   Rectal bleed 03/06/2021   Unstable angina (HCC)    Hx of CABG 07/09/2019   Lumbar radiculopathy 12/04/2018   Glaucoma 12/04/2018   Cortical age-related cataract of both eyes 12/04/2018   Pain management contract agreement 12/04/2018   Lumbar herniated disc 06/04/2018   Prediabetes 01/01/2018   Obesity (BMI 30.0-34.9) 12/30/2017   Benign neoplasm of descending colon    Essential hypertension 11/03/2015   Cardiomyopathy, ischemic 06/03/2015   Chronic systolic dysfunction of left ventricle 06/03/2015   Erectile dysfunction due to arterial insufficiency 12/23/2014   Lipoma of skin and subcutaneous tissue of neck 05/24/2014   History of cardioembolic stroke 11/02/2013   Vision loss of right eye 06/23/2013   Dyslipidemia 06/22/2013   Acute respiratory failure with hypoxia (HCC)    Dressler syndrome (HCC)    Coronary artery disease    Cardiomyopathy (HCC)    Dysphagia    Pseudoaneurysm of left ventricle of heart    Tobacco abuse    PCP:  [name], MD Pharmacy:   [facility] [phone] Ginette Otto, [place] - [address] AT [facility] [address] [place] [address] Phone: [phone] Fax: [phone]  [facility] - [place], [place] - [address] [place] [address] Phone: [phone] Fax: [phone]  [facility] [address] [place] [address] Phone: [phone] Fax: [phone]     Social Determinants of Health (SDOH) Social History: SDOH Screenings   Food Insecurity: Patient Unable To Answer (10/11/2022)  Housing: Patient Unable To Answer (10/11/2022)  Transportation Needs: Patient Unable To Answer (10/11/2022)  Utilities: Patient  Unable To Answer (10/11/2022)  Depression (PHQ2-9): Low Risk  (05/08/2022)  Social Connections: Unknown (08/28/2021)   Received from [facility]  Tobacco Use: High Risk (10/29/2022)   SDOH Interventions:     Readmission Risk Interventions     No data to display

## 2022-11-05 NOTE — Progress Notes (Addendum)
Speech Language Pathology Treatment: Dysphagia;Cognitive-Linquistic  Patient Details Name: BOSTYN BOGIE MRN: [id number] DOB: 1961/04/15 Today's Date: 11/05/2022 Time: 4098-1191 SLP Time Calculation (min) (ACUTE ONLY): 23 min  Assessment / Plan / Recommendation Clinical Impression  Pt seen today for cognitive-linguistic treatment targeting expressive and receptive aphasia. Pt clearly said "hi" and "some days are better than others" intelligibly as well as several other statements in social conversation today. Pt continues making improvements in language use and will continue to benefit from intensive ST f/u. SLP presented basic AAC board comprised of 12 images. When prompted, pt pointed to correct image 3/5 times given Max cueing. Pt demonstrates improvement with receptive language adequate for use of basic AAC board, although requires significant cueing to initiate motor movement necessary for pointing to the images using his L hand. Suspect he may benefit from a more simple AAC board (4-6 options), which SLP will target next session.  During trials of upgraded PO textures today, pt had no overt s/s of dysphagia or aspiration noted with solid textures or thin liquids. Pt presents with a functional swallow and is likely capable of swallowing more solid textures. Due to his reliance for feeding and edentulous status, question his ability to tolerate a full solid texture meal tray at this time, although he may be able to upgrade once dentures are in place. Recommend upgrading to Dys 3 diet with thin liquids. No further SLP f/u is necessary for swallowing, but will continue to f/u for cognition, speech, and language as able.    HPI HPI: Pt is a 62 y/o male who presented 6/19 with L weakness and numbness. Pt received TNK then developed new symptoms of aphasia and R weakness. He underwent mechanical thrombectomy L M3 segment MCA 6/19. Pt noted to have small hemorrhage and follow up CT showed progression of  hemorrhage 3cm then 7cm and small acute R cerebellar infarct. PEG placed 7/10. PMH: CAD s/p PCI w/stent and 1v CABG, multiple CGAs, systolic HF s/p ICD, HLD, HTN.      SLP Plan  Continue with current plan of care      Recommendations for follow up therapy are one component of a multi-disciplinary discharge planning process, led by the attending physician.  Recommendations may be updated based on patient status, additional functional criteria and insurance authorization.    Recommendations  Diet recommendations: Dysphagia 3 (mechanical soft);Thin liquid Liquids provided via: Cup;Straw Medication Administration: Whole meds with puree Supervision: Full supervision/cueing for compensatory strategies;Staff to assist with self feeding Compensations: Slow rate;Small sips/bites;Follow solids with liquid Postural Changes and/or Swallow Maneuvers: Seated upright 90 degrees;Upright 30-60 min after meal                  Oral care BID   Frequent or constant Supervision/Assistance Dysphagia, oropharyngeal phase (R13.12);Aphasia (R47.01);Cognitive communication deficit (Y78.295)     Continue with current plan of care     [name], M.A., CF-SLP Speech Language Pathology, Acute Rehabilitation Services  Secure Chat preferred [phone]   11/05/2022, 1:30 PM

## 2022-11-05 NOTE — Progress Notes (Incomplete)
Nutrition Follow-up  DOCUMENTATION CODES:  Not applicable  INTERVENTION:  Continue current diet as ordered per SLP Adjust bolus regimen to aid in CBG management: Jevity 1.5 4x/d (7 cartons total each day) Free water 30mL free water before and after each bolus This provides 2485kcal, 106g protein, and of free water ( total free water) Banatrol BID  NUTRITION DIAGNOSIS:  Inadequate oral intake related to inability to eat as evidenced by NPO status. Ongoing.   GOAL:  Patient will meet greater than or equal to 90% of their needs Met with TF at goal  MONITOR:  Diet advancement, Labs, Weight trends, TF tolerance, I & O's  REASON FOR ASSESSMENT:  Consult Enteral/tube feeding initiation and management  ASSESSMENT:  62 y.o. male presented to the ED with L side weakness, slurred speech, slow speech, and feeling dizzy. PMH includes CAD, CHF, HLD, and HTN. Pt admitted for stroke.  6/18 - Admitted; s/p TNK 6/19 - s/p thrombectomy of L M3; failed bedside swallow; Cortrak placed; tip distal stomach 7/10 - PEG placed in OR via EGD by surgery team 7/16 - MBS, DYS2, thin liquids  ***  Average Meal Intake: 7/16-7/20: 31% average intake x 8 recorded meals  Nutritionally Relevant Medications: Scheduled Meds:  atorvastatin  80 mg Oral Daily   famotidine  20 mg Oral BID   JEVITY 1.5 CAL/FIBER  415 mL Per Tube TID PC & HS   BANATROL TF  60 mL Per Tube BID   free water  60 mL Per Tube TID PC & HS   insulin aspart  0-15 Units Subcutaneous TID WC   insulin aspart  0-5 Units Subcutaneous QHS   multivitamin with minerals  1 tablet Oral Daily   polyethylene glycol  17 g Oral BID   spironolactone  12.5 mg Oral Daily   PRN Meds: diphenhydrAMINE-zinc acetate, ondansetron  Labs Reviewed: BUN 39 CBG ranges from 90-139 mg/dL over the last 24 hours  Admission weight: 108.6 kg Current weight: 99.4 kg  Diet Order:   Diet Order             DIET DYS 2 Room service  appropriate? No; Fluid consistency: Thin  Diet effective now                   EDUCATION NEEDS:  Not appropriate for education at this time  Skin:  Skin Assessment: Reviewed RN Assessment  Last BM:  7/16 - type 7  Height:  Ht Readings from Last 1 Encounters:  10/03/22 6\' 1"  (1.854 m)    Weight:  Wt Readings from Last 1 Encounters:  11/05/22 99.4 kg    Ideal Body Weight:  83.6 kg  BMI:  Body mass index is 28.91 kg/m.  Estimated Nutritional Needs:  Kcal:  2200-2400 kcal/d Protein:  105-125 g/d Fluid:  >/= 2 L    Greig Castilla, RD, LDN Clinical Dietitian RD pager # available in [facility]  After hours/weekend pager # available in [employer]

## 2022-11-05 NOTE — Progress Notes (Signed)
Occupational Therapy Treatment Patient Details Name: [name] MRN: [id number] DOB: [birth year] Today's Date: 11/05/2022   History of present illness Patient is a 62 y/o male admitted 10/03/22 with L weakness and numbness.  Received TNK then developed now symptoms of aphasia and R weakness.  He underwent mechanical thrombectomy L M3 segment MCA noted to have small hemorrhage and follow up CT showed progression of hemorrhage 3cm then 7cm and small acute R cerebellar infarct. PEG placed 7/10. CT on 7/13 showed slight diminished size of largest left frontal parietal lobe hematoma, but increased size of left periventricular hematoma, and several small new acute appearing hemorrhages posterior to this, and also in the right occipital lobe, left occipital lobe, left cerebellum and some hemorrhage layering in the bilateral occipital horns PMH positive for CAD s/p PCI w/stent and 1v CABG, multiple CGAs, systolic HF s/p ICD, HLD, HTN.   OT comments  Pt making incremental progress towards OT goals though remains limited by pain with activity. Guided pt in successful standing trials in [place] with Max A x 2. Attempted ADLs seated in [place] at sink though pt unable to tolerate long due to pain, often pushing back in [place] requiring hands on assist at all times for safety/balance. Pt with improving RUE movement though L digit tightness/discomfort remains a concern. Pt noted w/ skin integrity issue on L 1st digit - resting hand splint orders discontinued today.   Recommendations for follow up therapy are one component of a multi-disciplinary discharge planning process, led by the attending physician.  Recommendations may be updated based on patient status, additional functional criteria and insurance authorization.    Assistance Recommended at Discharge Frequent or constant Supervision/Assistance  Patient can return home with the following  Two people to help with walking and/or transfers;Two people to help with  bathing/dressing/bathroom;Assistance with cooking/housework;Assistance with feeding;Help with stairs or ramp for entrance;Direct supervision/assist for financial management;Assist for transportation;Direct supervision/assist for medications management   Equipment Recommendations  Wheelchair cushion (measurements OT);Wheelchair (measurements OT) (TBD)    Recommendations for Other Services      Precautions / Restrictions Precautions Precautions: Fall;Other (comment) Precaution Comments: PEG Restrictions Weight Bearing Restrictions: No       Mobility Bed Mobility Overal bed mobility: Needs Assistance Bed Mobility: Rolling, Sidelying to Sit, Sit to Sidelying Rolling: Mod assist Sidelying to sit: Max assist, +2 for physical assistance     Sit to sidelying: Mod assist, +2 for physical assistance General bed mobility comments: Able to assist in pulling with LUE to roll to L, bending L knee on command but assist needed to bring BLE off of bed, scoot hips forward and lift trunk. Pt able to assist with trunk back to bedwith BLE assist    Transfers Overall transfer level: Needs assistance Equipment used: Ambulation equipment used Transfers: Sit to/from Stand Sit to Stand: Max assist, +2 physical assistance, +2 safety/equipment           General transfer comment: Max A x 2 to stand in [place] and from [place] pads with R lateral lean. cues for attempting to use B hands on [place]     Balance Overall balance assessment: Needs assistance Sitting-balance support: Single extremity supported, Feet supported Sitting balance-Leahy Scale: Poor Sitting balance - Comments: min guard to intermittent Min A when fatigued or distracted by pain(?) Postural control: Posterior lean, Right lateral lean Standing balance support: Bilateral upper extremity supported Standing balance-Leahy Scale: Poor  ADL either performed or assessed with clinical judgement   ADL  Overall ADL's : Needs assistance/impaired     Grooming: Minimal assistance;Wash/dry face Grooming Details (indicate cue type and reason): seated in [place] at sink; distracted by discomfort             Lower Body Dressing: Total assistance;Bed level                 General ADL Comments: Focus on EOB balance, PROM of RUE and trial of [place] at sink    Extremity/Trunk Assessment Upper Extremity Assessment Upper Extremity Assessment: RUE deficits/detail;LUE deficits/detail RUE Deficits / Details: able to  make fist and extend digits actively though increased stiffness noted in PIP of 3rd finger. able to lift UE from bed to abdomen but difficulty replicating movements on command. discomfort in digit extension today. noted darker area on L 1st digit on dorsal aspect, likely from splint - discontinued splint orders and encouraged pt to rest with digits extended RUE Sensation: decreased light touch RUE Coordination: decreased fine motor;decreased gross motor LUE Deficits / Details: Using LUE, often times when cued to use R UE LUE Coordination: decreased gross motor;decreased fine motor   Lower Extremity Assessment Lower Extremity Assessment: Defer to PT evaluation        Vision   Vision Assessment?: No apparent visual deficits   Perception     Praxis      Cognition Arousal/Alertness: Awake/alert Behavior During Therapy: WFL for tasks assessed/performed Overall Cognitive Status: Difficult to assess Area of Impairment: Attention, Safety/judgement, Awareness, Following commands, Problem solving, Memory                   Current Attention Level: Selective Memory: Decreased short-term memory Following Commands: Follows one step commands with increased time, Follows one step commands inconsistently Safety/Judgement: Decreased awareness of deficits, Decreased awareness of safety Awareness: Intellectual Problem Solving: Slow processing, Requires verbal cues, Requires  tactile cues General Comments: speech with continued improvements, able to understand at least 75% of pt speech/needs expression. follows directions inconsistently in regards to RUE, often moving LUE to perform action. memory deficits as pt reports unsure if he wore splint last night though he did per dayshift RN        Exercises Other Exercises Other Exercises: P/AAROM of RUE focusing on digit extension    Shoulder Instructions       General Comments Discontinued splint orders due to L digit skin integrity concerns - discussed with MD and RN    Pertinent Vitals/ Pain       Pain Assessment Pain Assessment: Faces Faces Pain Scale: Hurts even more Pain Location: "everywhere"; R digits with ROM, R hip Pain Descriptors / Indicators: Grimacing, Guarding Pain Intervention(s): Monitored during session, Limited activity within patient's tolerance, Heat applied, Patient requesting pain meds-RN notified  Home Living                                          Prior Functioning/Environment              Frequency  Min 2X/week        Progress Toward Goals  OT Goals(current goals can now be found in the care plan section)  Progress towards OT goals: Progressing toward goals  Acute Rehab OT Goals OT Goal Formulation: Patient unable to participate in goal setting Time For Goal Achievement: 11/19/22 Potential to Achieve  Goals: Fair  Plan Discharge plan remains appropriate    Co-evaluation    PT/OT/SLP Co-Evaluation/Treatment: Yes Reason for Co-Treatment: For patient/therapist safety;To address functional/ADL transfers;Complexity of the patient's impairments (multi-system involvement);Necessary to address cognition/behavior during functional activity   OT goals addressed during session: ADL's and self-care;Strengthening/ROM      AM-PAC OT "6 Clicks" Daily Activity     Outcome Measure   Help from another person eating meals?: Total Help from another person  taking care of personal grooming?: A Lot Help from another person toileting, which includes using toliet, bedpan, or urinal?: Total Help from another person bathing (including washing, rinsing, drying)?: Total Help from another person to put on and taking off regular upper body clothing?: Total Help from another person to put on and taking off regular lower body clothing?: Total 6 Click Score: 7    End of Session Equipment Utilized During Treatment: Gait belt  OT Visit Diagnosis: Unsteadiness on feet (R26.81);Other abnormalities of gait and mobility (R26.89);Muscle weakness (generalized) (M62.81);Cognitive communication deficit (R41.841);Hemiplegia and hemiparesis;Low vision, both eyes (H54.2);Apraxia (R48.2);Other symptoms and signs involving cognitive function Symptoms and signs involving cognitive functions: Cerebral infarction Hemiplegia - Right/Left: Right Hemiplegia - dominant/non-dominant: Dominant Hemiplegia - caused by: Cerebral infarction   Activity Tolerance Patient tolerated treatment well;Patient limited by pain   Patient Left in bed;with call bell/phone within reach;with bed alarm set   Nurse Communication Mobility status        Time: 8295-6213 OT Time Calculation (min): 42 min  Charges: OT General Charges $OT Visit: 1 Visit OT Treatments $Therapeutic Activity: 8-22 mins $Therapeutic Exercise: 8-22 mins  Bradd Canary, OTR/L Acute Rehab Services Office: [phone]   Lorre Munroe 11/05/2022, 12:57 PM

## 2022-11-05 NOTE — Progress Notes (Signed)
PROGRESS NOTE    [name]  ZOX:[id number] DOB: 07-18-1960 DOA: 10/02/2022 PCP: [name], MD    Brief Narrative:  [name] is a 62 y.o. 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  Remains in the hospital.  Pending placement.  Hospital course complicated by hemorrhagic conversion of the stroke.  6/18: Admitted for stroke, received TNK; developed right sided deficits overnight after TNK, underwent NIR thrombectomy 6/19: Post thrombectomy CT showed left frontal IPH and SAH 6/20: Heparin started cautiously 6/22: CT showed progression of hematomas, heparin stopped 6/23: Repeat echo shows dehiscence of prior pseudoaneurysm patch with adherent LV thrombus; Cardiology consulted 6/26: ID consulted for MSSE bacteremia 6/28: EP consulted, recommended not to take out ICD; CT stabilized, heparin restarted 7/2: Transferred OOU 7/10: Placed PEG tube 7/11: Transitioned to basal bolus feedings and switched to Eliquis 7/13: Increased HA, repeat CT shows progression of ICHs; Eliquis held 7/15: TEE negative, antibiotics stopped 7/17: Eliquis restarted  Significant studies: 6/18 CTA head and neck: no LVO 6/18 repeat CTA head and neck: new distal left MCA M2 occlusion 6/19 Echo: poor quality images, nondiagnostic 6/19 TCD: positive study, indicating R>L shunt 6/19 LE doppler: no DVT 6/19 CT head: left frontal parietal IPH and adjacent [facility] 6/22 CT head: size now 8x4x4cm, with new 2cm component 6/23 complete echo: now shows recurrence of pseudoaneurysm, dehiscence of patch with adherent thrombus 6/25 coronary CTA: confirms above 6/28 CT head: stable large left frontoparietal hematoma 7/13: CT head shows worsening ICH 7/15 TEE: shows pseudoaneurysm with adherent clot, no valvular vegetations, no signs of vegetation  on ICD leads 7/16: CT head shows regression of ICH noted 7/13 7/19: CT head shows no change in ICH     Significant microbiology data: 6/18 MRSA nares: Negative 6/25 Blood cultures: MSSE in 2/2 6/27 blood cultures: NG in 2/2     Procedures: 6/18: TNK at 1246 6/19: Thrombectomy by Dr. Tommie Sams 6/19: Cortrak placed 7/10: PEG placed 7/15: TEE  Assessment & Plan:   Acute stroke with intraparenchymal hematoma: Patient admitted and underwent TNK.  Later same day had second stroke with left M2 occlusion and underwent thrombectomy.    Source of both likely cardioembolic from pseudoaneurysm.  Unfortunately, post-TNK/thrombectomy course complicated by ICH and SAH.   Heparin was started due to pseudoaneurysm but ICH size increased so this was held for 1 week.  Serial CTs eventually showed stabilization, transitioned to heparin again, and finally Eliquis. Continue Lipitor.   Bacteremia due to Gram-positive bacteria Treated with 2 weeks IV cefazolin.  ID consulted.  TEE showed no vegetations and no complications with ICD leads.  Completed antibiotic therapy.   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  Stable by echo as recently as Jan of this year.   After admission, Echo showed the patch dehisced and he had adherent thrombus.  Cardiology were consulted and recommended he would need long term anticoagulation and that ultimately, if his neurologic recovery allowed and he were felt to be healthy enough, repeat pseudoaneurysm repair would be indicated (a very high risk operation)    Acute on chronic systolic CHF (congestive heart failure) (HCC) Treated and now appears euvolemic - Continue Entresto, BB, spiro, Lasix   Essential hypertension - Continue Entresto,  Coreg, Lasix, metoprolol, spironolactone   Dyslipidemia - Continue Lipitor   Dysphagia PEG placed 7/10.   - Continue bolus feeds - SS correction insulin  with feeds    Cerebral edema (HCC) Treated with hypertonic saline.  Stable and no further 3% saline required.  Prognosis poor.   Thrombocytopenia (HCC) Resolved.   Aspiration pneumonia (HCC) Treated with Rocephin to cefazolin, now resolevd.   ICD (implantable cardioverter-defibrillator) in place  Groin itchiness: Without any lesions.  Local therapy with Benadryl and steroids.   Started on fluoxetine 10 mg daily.  Excellent response today.     DVT prophylaxis:  apixaban (ELIQUIS) tablet 5 mg   Code Status: DNR Family Communication: None at the bedside Disposition Plan: Status is: Inpatient Remains inpatient appropriate because: Pending placement.     Consultants:  Neurology Palliative care  Procedures:  Multiple procedures above  Antimicrobials:  Completed   Subjective:  Patient seen and examined.  No overnight events.  Today he was so grateful that he is no longer itching.  He is asking whether he can take something for his groin pain to completely go away.  No other overnight events.  Objective: Vitals:   11/04/22 2325 11/05/22 0316 11/05/22 0451 11/05/22 0734  BP: (!) 110/93 126/87  126/89  Pulse: 88 82  78  Resp: 18 16  18   Temp: 98 F (36.7 C) 98.4 F (36.9 C)  98.4 F (36.9 C)  TempSrc: Oral Oral  Oral  SpO2: 99% 98%  100%  Weight:   99.4 kg   Height:        Intake/Output Summary (Last 24 hours) at 11/05/2022 1028 Last data filed at 11/05/2022 0419 Gross per 24 hour  Intake --  Output 2600 ml  Net -2600 ml   Filed Weights   11/02/22 0500 11/04/22 0421 11/05/22 0451  Weight: 99.6 kg 102.3 kg 99.4 kg    Examination:  General: Chronically sick looking.  Laying in bed.  Interactive.  Cardiovascular: S1-S2 normal.  Regular rate rhythm. Respiratory: Bilateral clear.  No added sounds. Gastrointestinal: Soft.  Nontender.  Bowel sound present.  PEG tube present. Ext: Right-sided hemiparesis, dysarthria.  Follows commands.  He is appropriately  communicating but difficult to understand due to dysarthria.   Data Reviewed: I have personally reviewed following labs and imaging studies  CBC: Recent Labs  Lab 10/30/22 0605 11/01/22 0137 11/02/22 1105 11/04/22 0403  WBC 7.9 8.1 6.6 5.8  NEUTROABS  --   --  4.0  --   HGB 13.1 11.5* 12.1* 11.4*  HCT 39.1 33.4* 35.5* 34.1*  MCV 90.3 88.4 87.4 88.1  PLT 125* 115* 121* 141*   Basic Metabolic Panel: Recent Labs  Lab 10/30/22 0605 11/01/22 0137 11/02/22 1105 11/04/22 0403  NA 135 127* 131* 132*  K 4.0 4.6 3.9 5.1  CL 101 96* 97* 99  CO2 25 26 25 22   GLUCOSE 196* 84 131* 101*  BUN 39* 23 18 17   CREATININE 0.92 0.82 0.84 0.82  CALCIUM 8.7* 8.4* 9.0 8.9   GFR: Estimated Creatinine Clearance: 117.3 mL/min (by C-G formula based on SCr of 0.82 mg/dL). Liver Function Tests: Recent Labs  Lab 11/01/22 0137 11/04/22 0403  AST 29 36  ALT 24 29  ALKPHOS 97 110  BILITOT 0.6 0.8  PROT 6.2* 6.3*  ALBUMIN 3.0* 3.2*   No results for input(s): "LIPASE", "AMYLASE" in the last 168 hours. No results for input(s): "AMMONIA" in the last 168 hours. Coagulation Profile: No results for input(s): "INR", "  PROTIME" in the last 168 hours. Cardiac Enzymes: No results for input(s): "CKTOTAL", "CKMB", "CKMBINDEX", "TROPONINI" in the last 168 hours. BNP (last 3 results) No results for input(s): "PROBNP" in the last 8760 hours. HbA1C: No results for input(s): "HGBA1C" in the last 72 hours. CBG: Recent Labs  Lab 11/04/22 0627 11/04/22 1139 11/04/22 1631 11/04/22 2103 11/05/22 0617  GLUCAP 120* 115* 94 139* 90   Lipid Profile: No results for input(s): "CHOL", "HDL", "LDLCALC", "TRIG", "CHOLHDL", "LDLDIRECT" in the last 72 hours. Thyroid Function Tests: No results for input(s): "TSH", "T4TOTAL", "FREET4", "T3FREE", "THYROIDAB" in the last 72 hours. Anemia Panel: No results for input(s): "VITAMINB12", "FOLATE", "FERRITIN", "TIBC", "IRON", "RETICCTPCT" in the last 72 hours. Sepsis  Labs: No results for input(s): "PROCALCITON", "LATICACIDVEN" in the last 168 hours.  No results found for this or any previous visit (from the past 240 hour(s)).       Radiology Studies: No results found.      Scheduled Meds:  apixaban  5 mg Oral BID   atorvastatin  80 mg Oral Daily   carvedilol  3.125 mg Oral BID WC   famotidine  20 mg Oral BID   feeding supplement (JEVITY 1.5 CAL/FIBER)  415 mL Per Tube TID PC & HS   fiber supplement (BANATROL TF)  60 mL Per Tube BID   FLUoxetine  10 mg Oral Daily   free water  60 mL Per Tube TID PC & HS   gabapentin  300 mg Oral QHS   Gerhardt's butt cream   Topical BID   insulin aspart  0-15 Units Subcutaneous TID WC   insulin aspart  0-5 Units Subcutaneous QHS   multivitamin with minerals  1 tablet Oral Daily   nicotine  7 mg Transdermal Daily   nystatin   Topical TID   polyethylene glycol  17 g Oral BID   spironolactone  12.5 mg Oral Daily   Continuous Infusions:  sodium chloride 10 mL/hr at 10/22/22 1331     LOS: 34 days    Time spent: 35 minutes    Dorcas Carrow, MD [facility]

## 2022-11-05 NOTE — Progress Notes (Signed)
Physical Therapy Treatment  Patient Details Name: [name] MRN: [id number] DOB: 1960-07-04 Today's Date: 11/05/2022   History of Present Illness Patient is a 62 y/o male admitted 10/03/22 with L weakness and numbness.  Received TNK then developed now symptoms of aphasia and R weakness.  He underwent mechanical thrombectomy L M3 segment MCA noted to have small hemorrhage and follow up CT showed progression of hemorrhage 3cm then 7cm and small acute R cerebellar infarct. PEG placed 7/10. CT on 7/13 showed slight diminished size of largest left frontal parietal lobe hematoma, but increased size of left periventricular hematoma, and several small new acute appearing hemorrhages posterior to this, and also in the right occipital lobe, left occipital lobe, left cerebellum and some hemorrhage layering in the bilateral occipital horns PMH positive for CAD s/p PCI w/stent and 1v CABG, multiple CGAs, systolic HF s/p ICD, HLD, HTN.    PT Comments  Pt progressing towards physical therapy goals. Seen in conjunction with OT to maximize functional progress with emphasis on safety. Pt able to tolerate transfer to the sink for standing trials and ADL activity. Increased pain reported in R hip and pt asking to return to bed. Declined sitting up in the recliner but reports he will be agreeable tomorrow. Will continue to follow and progress as able per POC.      Assistance Recommended at Discharge Frequent or constant Supervision/Assistance  If plan is discharge home, recommend the following:  Can travel by private vehicle    Two people to help with walking and/or transfers;A lot of help with bathing/dressing/bathroom;Assistance with cooking/housework;Direct supervision/assist for medications management;Assist for transportation;Help with stairs or ramp for entrance;Assistance with feeding   No  Equipment Recommendations  Other (comment) (to be determined)    Recommendations for Other Services Rehab  consult     Precautions / Restrictions Precautions Precautions: Fall;Other (comment) Precaution Comments: PEG Restrictions Weight Bearing Restrictions: No     Mobility  Bed Mobility Overal bed mobility: Needs Assistance Bed Mobility: Rolling, Sit to Sidelying, Sidelying to Sit Rolling: Max assist Sidelying to sit: Max assist, +2 for physical assistance, +2 for safety/equipment     Sit to sidelying: +2 for physical assistance, Max assist General bed mobility comments: Pt with increased stiffness/discomfort, opted for log rolling to get EOB with Max A x 2 needed (pt with difficulty reaching bedrail with L UE to assist)    Transfers Overall transfer level: Needs assistance Equipment used: Ambulation equipment used Transfers: Sit to/from Stand Sit to Stand: Max assist, +2 physical assistance, +2 safety/equipment Stand pivot transfers: Mod assist, +2 physical assistance         General transfer comment: Max A x 2 to stand in [place] and from [place] pads with R lateral lean. cues for attempting to use B hands on [place] and assist maintain sitting balance during transition to/from sink while seated within [place].    Ambulation/Gait               General Gait Details: Unable to progress to gait training at this time.   Stairs             Wheelchair Mobility     Tilt Bed    Modified Rankin (Stroke Patients Only) Modified Rankin (Stroke Patients Only) Pre-Morbid Rankin Score: Moderate disability Modified Rankin: Severe disability     Balance Overall balance assessment: Needs assistance Sitting-balance support: Single extremity supported, Feet supported Sitting balance-Leahy Scale: Poor Sitting balance - Comments: min guard to intermittent Min A  when fatigued or distracted by pain(?) Postural control: Posterior lean, Right lateral lean Standing balance support: Bilateral upper extremity supported Standing balance-Leahy Scale: Poor                               Cognition Arousal/Alertness: Awake/alert Behavior During Therapy: WFL for tasks assessed/performed Overall Cognitive Status: Difficult to assess Area of Impairment: Attention, Safety/judgement, Awareness, Following commands, Problem solving, Memory                   Current Attention Level: Selective Memory: Decreased short-term memory Following Commands: Follows one step commands with increased time, Follows one step commands inconsistently Safety/Judgement: Decreased awareness of deficits, Decreased awareness of safety Awareness: Intellectual Problem Solving: Slow processing, Requires verbal cues, Requires tactile cues          Exercises      General Comments General comments (skin integrity, edema, etc.): Discontinued splint orders due to L digit skin integrity concerns - discussed with MD and RN      Pertinent Vitals/Pain Pain Assessment Pain Assessment: Faces Faces Pain Scale: Hurts even more Pain Location: "everywhere"; R digits with ROM, R hip Pain Descriptors / Indicators: Grimacing, Guarding Pain Intervention(s): Limited activity within patient's tolerance, Monitored during session, Repositioned    Home Living                          Prior Function            PT Goals (current goals can now be found in the care plan section) Acute Rehab PT Goals Patient Stated Goal: Decrease pain PT Goal Formulation: Patient unable to participate in goal setting Time For Goal Achievement: 11/13/22 Potential to Achieve Goals: Fair Progress towards PT goals: Progressing toward goals    Frequency    Min 1X/week      PT Plan Current plan remains appropriate    Co-evaluation PT/OT/SLP Co-Evaluation/Treatment: Yes Reason for Co-Treatment: For patient/therapist safety;To address functional/ADL transfers;Complexity of the patient's impairments (multi-system involvement);Necessary to address cognition/behavior during functional activity PT  goals addressed during session: Mobility/safety with mobility;Balance;Proper use of DME;Strengthening/ROM OT goals addressed during session: ADL's and self-care;Strengthening/ROM      AM-PAC PT "6 Clicks" Mobility   Outcome Measure  Help needed turning from your back to your side while in a flat bed without using bedrails?: A Lot Help needed moving from lying on your back to sitting on the side of a flat bed without using bedrails?: Total Help needed moving to and from a bed to a chair (including a wheelchair)?: Total Help needed standing up from a chair using your arms (e.g., wheelchair or bedside chair)?: Total Help needed to walk in hospital room?: Total Help needed climbing 3-5 steps with a railing? : Total 6 Click Score: 7    End of Session Equipment Utilized During Treatment: Gait belt Activity Tolerance: Patient tolerated treatment well Patient left: with call bell/phone within reach;in bed;with bed alarm set;with SCD's reapplied Nurse Communication: Mobility status;Need for lift equipment PT Visit Diagnosis: Other abnormalities of gait and mobility (R26.89);Other symptoms and signs involving the nervous system (R29.898);Hemiplegia and hemiparesis Hemiplegia - Right/Left: Right Hemiplegia - dominant/non-dominant: Dominant Hemiplegia - caused by: Cerebral infarction;Other Nontraumatic intracranial hemorrhage     Time: 7425-9563 PT Time Calculation (min) (ACUTE ONLY): 32 min  Charges:    $Neuromuscular Re-education: 8-22 mins PT General Charges $$ ACUTE PT VISIT: 1 Visit  Conni Slipper, PT, DPT Acute Rehabilitation Services Secure Chat Preferred Office: [phone]    Marylynn Pearson 11/05/2022, 1:35 PM

## 2022-11-06 DIAGNOSIS — I253 Aneurysm of heart: Secondary | ICD-10-CM | POA: Diagnosis not present

## 2022-11-06 DIAGNOSIS — R7881 Bacteremia: Secondary | ICD-10-CM | POA: Diagnosis not present

## 2022-11-06 DIAGNOSIS — G936 Cerebral edema: Secondary | ICD-10-CM | POA: Diagnosis not present

## 2022-11-06 LAB — GLUCOSE, CAPILLARY
Glucose-Capillary: 89 mg/dL (ref 70–99)
Glucose-Capillary: 139 mg/dL — ABNORMAL HIGH (ref 70–99)
Glucose-Capillary: 96 mg/dL (ref 70–99)
Glucose-Capillary: 215 mg/dL — ABNORMAL HIGH (ref 70–99)

## 2022-11-06 MED ORDER — JEVITY 1.5 CAL/FIBER PO LIQD
300.0000 mL | Freq: Three times a day (TID) | ORAL | Status: DC
  Administered 2022-11-06 – 2022-11-13 (×24): 300 mL
  Filled 2022-11-06 (×31): qty 474

## 2022-11-06 MED ORDER — INSULIN ASPART 100 UNIT/ML IJ SOLN
0.0000 [IU] | Freq: Every day | INTRAMUSCULAR | Status: DC
  Administered 2022-11-06: 2 [IU] via SUBCUTANEOUS

## 2022-11-06 MED ORDER — INSULIN ASPART 100 UNIT/ML IJ SOLN
0.0000 [IU] | Freq: Three times a day (TID) | INTRAMUSCULAR | Status: DC
  Administered 2022-11-08 – 2022-11-14 (×3): 1 [IU] via SUBCUTANEOUS

## 2022-11-06 NOTE — Progress Notes (Signed)
PROGRESS NOTE    LOCHLAN GRYGIEL  UJW:[id number] DOB: [birth year] DOA: 10/02/2022 PCP: [name], MD    Brief Narrative:  Mr. Marques is a 62 y.o. 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  Remains in the hospital.  Pending placement.  Hospital course complicated by hemorrhagic conversion of the stroke.  6/18: Admitted for stroke, received TNK; developed right sided deficits overnight after TNK, underwent NIR thrombectomy 6/19: Post thrombectomy CT showed left frontal IPH and SAH 6/20: Heparin started cautiously 6/22: CT showed progression of hematomas, heparin stopped 6/23: Repeat echo shows dehiscence of prior pseudoaneurysm patch with adherent LV thrombus; Cardiology consulted 6/26: ID consulted for MSSE bacteremia 6/28: EP consulted, recommended not to take out ICD; CT stabilized, heparin restarted 7/2: Transferred OOU 7/10: Placed PEG tube 7/11: Transitioned to basal bolus feedings and switched to Eliquis 7/13: Increased HA, repeat CT shows progression of ICHs; Eliquis held 7/15: TEE negative, antibiotics stopped 7/17: Eliquis restarted  Significant studies: 6/18 CTA head and neck: no LVO 6/18 repeat CTA head and neck: new distal left MCA M2 occlusion 6/19 Echo: poor quality images, nondiagnostic 6/19 TCD: positive study, indicating R>L shunt 6/19 LE doppler: no DVT 6/19 CT head: left frontal parietal IPH and adjacent [facility] 6/22 CT head: size now 8x4x4cm, with new 2cm component 6/23 complete echo: now shows recurrence of pseudoaneurysm, dehiscence of patch with adherent thrombus 6/25 coronary CTA: confirms above 6/28 CT head: stable large left frontoparietal hematoma 7/13: CT head shows worsening ICH 7/15 TEE: shows pseudoaneurysm with adherent clot, no valvular vegetations, no signs of vegetation  on ICD leads 7/16: CT head shows regression of ICH noted 7/13 7/19: CT head shows no change in ICH     Significant microbiology data: 6/18 MRSA nares: Negative 6/25 Blood cultures: MSSE in 2/2 6/27 blood cultures: NG in 2/2     Procedures: 6/18: TNK at 1246 6/19: Thrombectomy by Dr. Tommie Sams 6/19: Cortrak placed 7/10: PEG placed 7/15: TEE  Assessment & Plan:   Acute stroke with intraparenchymal hematoma: Patient admitted and underwent TNK.  Later same day had second stroke with left M2 occlusion and underwent thrombectomy.    Source of both likely cardioembolic from pseudoaneurysm.  Unfortunately, post-TNK/thrombectomy course complicated by ICH and SAH.   Heparin was started due to pseudoaneurysm but ICH size increased so this was held for 1 week.  Serial CTs eventually showed stabilization, transitioned to heparin again, and finally Eliquis. Continue Lipitor.   Bacteremia due to Gram-positive bacteria Treated with 2 weeks IV cefazolin.  ID consulted.  TEE showed no vegetations and no complications with ICD leads.  Completed antibiotic therapy.   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  Stable by echo as recently as Jan of this year.   After admission, Echo showed the patch dehisced and he had adherent thrombus.  Cardiology were consulted and recommended he would need long term anticoagulation and that ultimately, if his neurologic recovery allowed and he were felt to be healthy enough, repeat pseudoaneurysm repair would be indicated (a very high risk operation)    Acute on chronic systolic CHF (congestive heart failure) (HCC) Treated and now appears euvolemic - Continue Entresto, BB, spiro, Lasix   Essential hypertension - Continue Entresto,  Coreg, Lasix, metoprolol, spironolactone   Dyslipidemia - Continue Lipitor   Dysphagia PEG placed 7/10.   - Continue bolus feeds - SS correction insulin  with feeds    Cerebral edema (HCC) Treated with hypertonic saline.  Stable and no further 3% saline required.  Prognosis poor.   Thrombocytopenia (HCC) Resolved.   Aspiration pneumonia (HCC) Treated with Rocephin to cefazolin, now resolevd.   ICD (implantable cardioverter-defibrillator) in place  Groin itchiness: Without any lesions.  Local therapy with Benadryl and steroids.   Started on fluoxetine 10 mg daily.  Excellent response with SSRI.     DVT prophylaxis:  apixaban (ELIQUIS) tablet 5 mg   Code Status: DNR Family Communication: None at the bedside Disposition Plan: Status is: Inpatient Remains inpatient appropriate because: Pending placement.  Medically stable.     Consultants:  Neurology Palliative care  Procedures:  Multiple procedures above  Antimicrobials:  Completed   Subjective:  Patient seen and examined.  Has dysarthria but able to communicate with gestures.  Patient tells me he is doing well.  He denies any complaints.  Itching has improved.  He has some pain in his knees.  Objective: Vitals:   11/05/22 2337 11/06/22 0401 11/06/22 0416 11/06/22 0743  BP: 120/73 132/75  118/85  Pulse: 93 79  76  Resp: 19 18    Temp: 98.9 F (37.2 C) 98.7 F (37.1 C)  98.3 F (36.8 C)  TempSrc: Oral Oral  Oral  SpO2: 100% 98%  100%  Weight:   99.5 kg   Height:        Intake/Output Summary (Last 24 hours) at 11/06/2022 1116 Last data filed at 11/06/2022 1013 Gross per 24 hour  Intake 560 ml  Output 1700 ml  Net -1140 ml   Filed Weights   11/04/22 0421 11/05/22 0451 11/06/22 0416  Weight: 102.3 kg 99.4 kg 99.5 kg    Examination:  General: Chronically sick looking.  Interactive but dysarthric. Cardiovascular: S1-S2 normal.  Regular rate rhythm. Respiratory: Bilateral clear.  No added sounds. Gastrointestinal: Soft.  Nontender.  Bowel sound present.  PEG tube present. Ext: Right-sided hemiparesis, dysarthria.  Follows commands.  He is appropriately  communicating but difficult to understand due to dysarthria.   Data Reviewed: I have personally reviewed following labs and imaging studies  CBC: Recent Labs  Lab 11/01/22 0137 11/02/22 1105 11/04/22 0403  WBC 8.1 6.6 5.8  NEUTROABS  --  4.0  --   HGB 11.5* 12.1* 11.4*  HCT 33.4* 35.5* 34.1*  MCV 88.4 87.4 88.1  PLT 115* 121* 141*   Basic Metabolic Panel: Recent Labs  Lab 11/01/22 0137 11/02/22 1105 11/04/22 0403  NA 127* 131* 132*  K 4.6 3.9 5.1  CL 96* 97* 99  CO2 26 25 22   GLUCOSE 84 131* 101*  BUN 23 18 17   CREATININE 0.82 0.84 0.82  CALCIUM 8.4* 9.0 8.9   GFR: Estimated Creatinine Clearance: 117.3 mL/min (by C-G formula based on SCr of 0.82 mg/dL). Liver Function Tests: Recent Labs  Lab 11/01/22 0137 11/04/22 0403  AST 29 36  ALT 24 29  ALKPHOS 97 110  BILITOT 0.6 0.8  PROT 6.2* 6.3*  ALBUMIN 3.0* 3.2*   No results for input(s): "LIPASE", "AMYLASE" in the last 168 hours. No results for input(s): "AMMONIA" in the last 168 hours. Coagulation Profile: No results for input(s): "INR", "PROTIME" in the last 168 hours. Cardiac Enzymes: No results for input(s): "CKTOTAL", "CKMB", "CKMBINDEX", "TROPONINI" in the last 168 hours. BNP (last  3 results) No results for input(s): "PROBNP" in the last 8760 hours. HbA1C: No results for input(s): "HGBA1C" in the last 72 hours. CBG: Recent Labs  Lab 11/05/22 0617 11/05/22 1200 11/05/22 1639 11/05/22 2108 11/06/22 0614  GLUCAP 90 106* 136* 119* 89   Lipid Profile: No results for input(s): "CHOL", "HDL", "LDLCALC", "TRIG", "CHOLHDL", "LDLDIRECT" in the last 72 hours. Thyroid Function Tests: No results for input(s): "TSH", "T4TOTAL", "FREET4", "T3FREE", "THYROIDAB" in the last 72 hours. Anemia Panel: No results for input(s): "VITAMINB12", "FOLATE", "FERRITIN", "TIBC", "IRON", "RETICCTPCT" in the last 72 hours. Sepsis Labs: No results for input(s): "PROCALCITON", "LATICACIDVEN" in the last 168 hours.  No  results found for this or any previous visit (from the past 240 hour(s)).       Radiology Studies: No results found.      Scheduled Meds:  apixaban  5 mg Oral BID   atorvastatin  80 mg Oral Daily   carvedilol  3.125 mg Oral BID WC   famotidine  20 mg Oral BID   feeding supplement (JEVITY 1.5 CAL/FIBER)  415 mL Per Tube TID PC & HS   fiber supplement (BANATROL TF)  60 mL Per Tube BID   FLUoxetine  10 mg Oral Daily   free water  60 mL Per Tube TID PC & HS   gabapentin  300 mg Oral QHS   Gerhardt's butt cream   Topical BID   insulin aspart  0-15 Units Subcutaneous TID WC   insulin aspart  0-5 Units Subcutaneous QHS   multivitamin with minerals  1 tablet Oral Daily   nicotine  7 mg Transdermal Daily   nystatin   Topical TID   polyethylene glycol  17 g Oral BID   spironolactone  12.5 mg Oral Daily   Continuous Infusions:  sodium chloride 10 mL/hr at 10/22/22 1331     LOS: 35 days    Time spent: 35 minutes    Dorcas Carrow, MD [facility]

## 2022-11-06 NOTE — Progress Notes (Signed)
Occupational Therapy Treatment Patient Details Name: RONNIE DOO MRN: [id number] DOB: 1961/03/23 Today's Date: 11/06/2022   History of present illness Patient is a 62 y/o male admitted 10/03/22 with L weakness and numbness.  Received TNK then developed now symptoms of aphasia and R weakness.  He underwent mechanical thrombectomy L M3 segment MCA noted to have small hemorrhage and follow up CT showed progression of hemorrhage 3cm then 7cm and small acute R cerebellar infarct. PEG placed 7/10. CT on 7/13 showed slight diminished size of largest left frontal parietal lobe hematoma, but increased size of left periventricular hematoma, and several small new acute appearing hemorrhages posterior to this, and also in the right occipital lobe, left occipital lobe, left cerebellum and some hemorrhage layering in the bilateral occipital horns PMH positive for CAD s/p PCI w/stent and 1v CABG, multiple CGAs, systolic HF s/p ICD, HLD, HTN.   OT comments  Session focused on continued RUE function and flexibility. Continued discomfort noted with R UE PROM (specifically digits). Guided pt in self feeding tasks with primary use of RUE with overall Mod A needed. Plan to further assess AE during meals to maximize independence. Also contacted family via phone in prep for scheduling caregiver education for PROM.    Recommendations for follow up therapy are one component of a multi-disciplinary discharge planning process, led by the attending physician.  Recommendations may be updated based on patient status, additional functional criteria and insurance authorization.    Assistance Recommended at Discharge Frequent or constant Supervision/Assistance  Patient can return home with the following  Two people to help with walking and/or transfers;Two people to help with bathing/dressing/bathroom;Assistance with cooking/housework;Assistance with feeding;Help with stairs or ramp for entrance;Direct supervision/assist for  financial management;Assist for transportation;Direct supervision/assist for medications management   Equipment Recommendations  Wheelchair cushion (measurements OT);Wheelchair (measurements OT);Other (comment) (hoyer lift)    Recommendations for Other Services      Precautions / Restrictions Precautions Precautions: Fall;Other (comment) Precaution Comments: PEG Restrictions Weight Bearing Restrictions: No       Mobility Bed Mobility Overal bed mobility: Needs Assistance             General bed mobility comments: Max A to adjust trunk to midline in bed. Mod A to scoot up in bed with pt assisting to push up with LLE    Transfers                         Balance                                           ADL either performed or assessed with clinical judgement   ADL Overall ADL's : Needs assistance/impaired Eating/Feeding: Moderate assistance;Bed level Eating/Feeding Details (indicate cue type and reason): cues to attend/use R UE as L UE attempting to interfere with tasks involuntarily. after time spent to stretch fingers in extension, pt able to hold styrofoam cup and bring to mouth with hand over hand Min A during session. undershooting straw to mouth and noted too strong of a grasp on cup (may benefit from plastic cup with lid and handle next time). pt able to hold utensils with B hands with Min-Mod A though dfficult to maintain grasp (would benefit from trial of built up red foam grips). cues for safe swallowing strategies  General ADL Comments: Emphasis on R UE function, importance of stretching and self feeding strategies with identification of AE to trial    Extremity/Trunk Assessment Upper Extremity Assessment Upper Extremity Assessment: RUE deficits/detail;LUE deficits/detail RUE Deficits / Details: able to  make fist and extend digits actively though increased stiffness noted in PIP of 3rd  finger. able to lift UE from bed to abdomen but difficulty replicating movements on command. hand over hand assist to bend elbow and bring hand to mouth.discomfort in digit extension. noted darker area on L 1st digit on dorsal aspect, likely from splint - discontinued splint orders and encouraged pt to rest with digits extended 7/22 RUE Sensation: decreased light touch RUE Coordination: decreased fine motor;decreased gross motor LUE Deficits / Details: Using LUE, often times when cued to use R UE, involuntary movements at times LUE Coordination: decreased gross motor;decreased fine motor   Lower Extremity Assessment Lower Extremity Assessment: Defer to PT evaluation        Vision   Vision Assessment?: No apparent visual deficits   Perception     Praxis      Cognition Arousal/Alertness: Awake/alert Behavior During Therapy: WFL for tasks assessed/performed Overall Cognitive Status: Difficult to assess Area of Impairment: Attention, Safety/judgement, Awareness, Following commands, Problem solving, Memory                   Current Attention Level: Selective Memory: Decreased short-term memory Following Commands: Follows one step commands with increased time, Follows one step commands inconsistently Safety/Judgement: Decreased awareness of deficits, Decreased awareness of safety Awareness: Intellectual, Emergent Problem Solving: Slow processing, Requires verbal cues, Requires tactile cues General Comments: speech with continued improvements, able to understand at least 75% of pt speech/needs expression. follows directions inconsistently in regards to RUE, often moving LUE to perform action. cues for safety with self feeding needed, as well as problem solving        Exercises Exercises: Other exercises Other Exercises Other Exercises: P/AAROM of R hand    Shoulder Instructions       General Comments Contacted son via phone to inquire who often visits pt to coordinate  stretching UE who referred to pt's significant other. Called [name] via phone who has a varying work schedule but can come later in afternoons.    Pertinent Vitals/ Pain       Pain Assessment Pain Assessment: Faces Faces Pain Scale: Hurts even more Pain Location: R digits with attempts to extend Pain Descriptors / Indicators: Grimacing, Guarding, Moaning Pain Intervention(s): Monitored during session  Home Living                                          Prior Functioning/Environment              Frequency  Min 1X/week        Progress Toward Goals  OT Goals(current goals can now be found in the care plan section)  Progress towards OT goals: Progressing toward goals  Acute Rehab OT Goals OT Goal Formulation: With patient Time For Goal Achievement: 11/19/22 Potential to Achieve Goals: Fair ADL Goals Pt Will Perform Eating: with supervision;sitting;bed level;with adaptive utensils Pt Will Perform Grooming: with min guard assist;sitting Pt Will Perform Upper Body Bathing: with mod assist;sitting Pt Will Perform Lower Body Bathing: with mod assist;sitting/lateral leans Pt Will Perform Upper Body Dressing: with mod assist;sitting Pt Will Perform Lower Body Dressing:  with max assist;sitting/lateral leans;sit to/from stand Pt Will Transfer to Toilet: squat pivot transfer;stand pivot transfer;bedside commode;with mod assist Additional ADL Goal #1: Patient will tolerate R resting hand splint 4 hours on and 4 off with no skin integrity issues.  Plan Discharge plan remains appropriate    Co-evaluation                 AM-PAC OT "6 Clicks" Daily Activity     Outcome Measure   Help from another person eating meals?: A Lot Help from another person taking care of personal grooming?: A Lot Help from another person toileting, which includes using toliet, bedpan, or urinal?: Total Help from another person bathing (including washing, rinsing, drying)?:  Total Help from another person to put on and taking off regular upper body clothing?: A Lot Help from another person to put on and taking off regular lower body clothing?: Total 6 Click Score: 9    End of Session    OT Visit Diagnosis: Unsteadiness on feet (R26.81);Other abnormalities of gait and mobility (R26.89);Muscle weakness (generalized) (M62.81);Cognitive communication deficit (R41.841);Hemiplegia and hemiparesis;Low vision, both eyes (H54.2);Apraxia (R48.2);Other symptoms and signs involving cognitive function Symptoms and signs involving cognitive functions: Cerebral infarction Hemiplegia - Right/Left: Right Hemiplegia - dominant/non-dominant: Dominant Hemiplegia - caused by: Cerebral infarction   Activity Tolerance Patient tolerated treatment well   Patient Left in bed;with call bell/phone within reach;with bed alarm set   Nurse Communication Mobility status;Other (comment) (discussed with NT)        Time: [phone] OT Time Calculation (min): 33 min  Charges: OT General Charges $OT Visit: 1 Visit OT Treatments $Self Care/Home Management : 23-37 mins  Bradd Canary, OTR/L Acute Rehab Services Office: [phone]   Lorre Munroe 11/06/2022, 10:29 AM

## 2022-11-07 DIAGNOSIS — R7881 Bacteremia: Secondary | ICD-10-CM | POA: Diagnosis not present

## 2022-11-07 DIAGNOSIS — I253 Aneurysm of heart: Secondary | ICD-10-CM | POA: Diagnosis not present

## 2022-11-07 DIAGNOSIS — R1310 Dysphagia, unspecified: Secondary | ICD-10-CM | POA: Diagnosis not present

## 2022-11-07 DIAGNOSIS — G936 Cerebral edema: Secondary | ICD-10-CM | POA: Diagnosis not present

## 2022-11-07 DIAGNOSIS — I5023 Acute on chronic systolic (congestive) heart failure: Secondary | ICD-10-CM | POA: Diagnosis not present

## 2022-11-07 LAB — GLUCOSE, CAPILLARY
Glucose-Capillary: 85 mg/dL (ref 70–99)
Glucose-Capillary: 110 mg/dL — ABNORMAL HIGH (ref 70–99)
Glucose-Capillary: 119 mg/dL — ABNORMAL HIGH (ref 70–99)
Glucose-Capillary: 108 mg/dL — ABNORMAL HIGH (ref 70–99)

## 2022-11-07 MED ORDER — CYCLOBENZAPRINE HCL 10 MG PO TABS
5.0000 mg | ORAL_TABLET | Freq: Three times a day (TID) | ORAL | Status: DC | PRN
  Administered 2022-11-07 – 2022-11-16 (×11): 5 mg via ORAL
  Filled 2022-11-07 (×11): qty 1

## 2022-11-07 NOTE — Progress Notes (Signed)
Speech Language Pathology Treatment: Cognitive-Linquistic  Patient Details Name: [name] MRN: [id number] DOB: June 02, 1960 Today's Date: 11/07/2022 Time: [id number] SLP Time Calculation (min) (ACUTE ONLY): 15 min  Assessment / Plan / Recommendation Clinical Impression  Pt continues demonstrating improvement with use of AAC communication board. He accurately pointed to the correct choice when asked indirect questions and the board was reduced to <6 options in all opportunities given Mod verbal cues. Pt made a functional request for his glasses using the communication board given Min verbal cues. His spontaneous speech remains intermittently intelligible, typically with social automatic phrases being most intelligible. He demonstrates progress and potential to continue improving given intensive ST. May be a good candidate for aug com device. Will f/u as able to assess ability to use more complex aug com.    HPI HPI: Pt is a 62 y/o male who presented 6/19 with L weakness and numbness. Pt received TNK then developed new symptoms of aphasia and R weakness. He underwent mechanical thrombectomy L M3 segment MCA 6/19. Pt noted to have small hemorrhage and follow up CT showed progression of hemorrhage 3cm then 7cm and small acute R cerebellar infarct. PEG placed 7/10. PMH: CAD s/p PCI w/stent and 1v CABG, multiple CGAs, systolic HF s/p ICD, HLD, HTN.      SLP Plan  Continue with current plan of care      Recommendations for follow up therapy are one component of a multi-disciplinary discharge planning process, led by the attending physician.  Recommendations may be updated based on patient status, additional functional criteria and insurance authorization.    Recommendations  Diet recommendations: Dysphagia 3 (mechanical soft);Thin liquid Liquids provided via: Cup;Straw Medication Administration: Whole meds with puree Supervision: Full supervision/cueing for compensatory strategies;Staff to  assist with self feeding Compensations: Slow rate;Small sips/bites;Follow solids with liquid                  Oral care BID   Frequent or constant Supervision/Assistance Aphasia (R47.01);Cognitive communication deficit (R41.841);Dysarthria and anarthria (R47.1)     Continue with current plan of care     [name], M.A., CF-SLP Speech Language Pathology, Acute Rehabilitation Services  Secure Chat preferred [phone]   11/07/2022, 4:12 PM

## 2022-11-07 NOTE — Progress Notes (Signed)
PROGRESS NOTE    SAED HUDLOW  WUJ:[id number] DOB: February 03, 1961 DOA: 10/02/2022 PCP: [name], MD    Brief Narrative:  Mr. Altschuler is a 62 y.o. 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  Remains in the hospital.  Hospital course complicated by hemorrhagic conversion of the stroke. Improving now, waiting for SNF  6/18: Admitted for stroke, received TNK; developed right sided deficits overnight after TNK, underwent NIR thrombectomy 6/19: Post thrombectomy CT showed left frontal IPH and SAH 6/20: Heparin started cautiously 6/22: CT showed progression of hematomas, heparin stopped 6/23: Repeat echo shows dehiscence of prior pseudoaneurysm patch with adherent LV thrombus; Cardiology consulted 6/26: ID consulted for MSSE bacteremia 6/28: EP consulted, recommended not to take out ICD; CT stabilized, heparin restarted 7/2: Transferred [facility] 7/10: Placed PEG tube 7/11: Transitioned to basal bolus feedings and switched to Eliquis 7/13: Increased HA, repeat CT shows progression of ICHs; Eliquis held 7/15: TEE negative, antibiotics stopped 7/17: Eliquis restarted  Significant studies: 6/18 CTA head and neck: no LVO 6/18 repeat CTA head and neck: new distal left MCA M2 occlusion 6/19 Echo: poor quality images, nondiagnostic 6/19 TCD: positive study, indicating R>L shunt 6/19 LE doppler: no DVT 6/19 CT head: left frontal parietal IPH and adjacent [facility] 6/22 CT head: size now 8x4x4cm, with new 2cm component 6/23 complete echo: now shows recurrence of pseudoaneurysm, dehiscence of patch with adherent thrombus 6/25 coronary CTA: confirms above 6/28 CT head: stable large left frontoparietal hematoma 7/13: CT head shows worsening ICH 7/15 TEE: shows pseudoaneurysm with adherent clot, no valvular vegetations, no signs of  vegetation on ICD leads 7/16: CT head shows regression of ICH noted 7/13 7/19: CT head shows no change in ICH     Significant microbiology data: 6/18 MRSA nares: Negative 6/25 Blood cultures: MSSE in 2/2 6/27 blood cultures: NG in 2/2     Procedures: 6/18: TNK at 1246 6/19: Thrombectomy by Dr. Tommie Sams 6/19: Cortrak placed 7/10: PEG placed 7/15: TEE  Assessment & Plan:   Acute stroke with intraparenchymal hematoma: Patient admitted and underwent TNK.  Later same day had second stroke with left M2 occlusion and underwent thrombectomy.    Source of both likely cardioembolic from pseudoaneurysm.  Unfortunately, post-TNK/thrombectomy course complicated by ICH and SAH.   Heparin was started due to pseudoaneurysm but ICH size increased so this was held for 1 week.  Serial CTs eventually showed stabilization, transitioned to heparin again, and finally Eliquis. Continue Lipitor.   Bacteremia due to Gram-positive bacteria Treated with 2 weeks IV cefazolin.  ID consulted.  TEE showed no vegetations and no complications with ICD leads.  Completed antibiotic therapy.   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  Stable by echo as recently as Jan of this year.   After admission, Echo showed the patch dehisced and he had adherent thrombus.  Cardiology were consulted and recommended he would need long term anticoagulation and that ultimately, if his neurologic recovery allowed and he were felt to be healthy enough, repeat pseudoaneurysm repair would be indicated (a very high risk operation)    Acute on chronic systolic CHF (congestive heart failure) (HCC) Treated and now appears euvolemic - Continue Entresto, BB, spiro, Lasix   Essential hypertension -  Continue Entresto, Coreg, Lasix, metoprolol, spironolactone   Dyslipidemia - Continue Lipitor   Dysphagia PEG placed 7/10.   - Continue bolus feeds - SS  correction insulin with feeds    Cerebral edema (HCC) Treated with hypertonic saline.  Stable and no further 3% saline required.  Prognosis poor.   Thrombocytopenia (HCC) Resolved.   Aspiration pneumonia (HCC) Treated with Rocephin to cefazolin, now resolevd.   ICD (implantable cardioverter-defibrillator) in place  Groin itchiness: Without any lesions.  Local therapy with Benadryl and steroids.   Started on fluoxetine 10 mg daily.  Excellent response with SSRI. Using flexeril and tylenol for generalized bodyache     DVT prophylaxis:  apixaban (ELIQUIS) tablet 5 mg   Code Status: DNR Family Communication: None at the bedside Disposition Plan: Status is: Inpatient Remains inpatient appropriate because: Pending placement.  Medically stable.     Consultants:  Neurology Palliative care  Procedures:  Multiple procedures above  Antimicrobials:  Completed   Subjective:  Patient seen and examined.  No overnight events.  He is more alert awake and interactive.  He still has dysarthria but he can communicate.  He has started eating some meal.  Calorie count in process.  Objective: Vitals:   11/07/22 0414 11/07/22 0500 11/07/22 0825 11/07/22 1122  BP: 117/82  (!) 126/92 130/89  Pulse: 75  64 84  Resp: 16  18 20   Temp: 98.1 F (36.7 C)  97.6 F (36.4 C) 98.2 F (36.8 C)  TempSrc:   Oral Oral  SpO2: 100%  98% 100%  Weight:  99.8 kg    Height:        Intake/Output Summary (Last 24 hours) at 11/07/2022 1336 Last data filed at 11/07/2022 0600 Gross per 24 hour  Intake 580 ml  Output 1550 ml  Net -970 ml   Filed Weights   11/05/22 0451 11/06/22 0416 11/07/22 0500  Weight: 99.4 kg 99.5 kg 99.8 kg    Examination:  General: Chronically sick looking.  Looks comfortable.  Interactive but dysarthric. Cardiovascular: S1-S2 normal.  Regular rate rhythm. Respiratory: Bilateral clear.  No added sounds. Gastrointestinal: Soft.  Nontender.  Bowel sound present.  PEG tube  present. Ext: Right-sided hemiparesis, dysarthria.  Follows commands.  He is appropriately communicating but difficult to understand due to dysarthria.   Data Reviewed: I have personally reviewed following labs and imaging studies  CBC: Recent Labs  Lab 11/01/22 0137 11/02/22 1105 11/04/22 0403  WBC 8.1 6.6 5.8  NEUTROABS  --  4.0  --   HGB 11.5* 12.1* 11.4*  HCT 33.4* 35.5* 34.1*  MCV 88.4 87.4 88.1  PLT 115* 121* 141*   Basic Metabolic Panel: Recent Labs  Lab 11/01/22 0137 11/02/22 1105 11/04/22 0403  NA 127* 131* 132*  K 4.6 3.9 5.1  CL 96* 97* 99  CO2 26 25 22   GLUCOSE 84 131* 101*  BUN 23 18 17   CREATININE 0.82 0.84 0.82  CALCIUM 8.4* 9.0 8.9   GFR: Estimated Creatinine Clearance: 117.6 mL/min (by C-G formula based on SCr of 0.82 mg/dL). Liver Function Tests: Recent Labs  Lab 11/01/22 0137 11/04/22 0403  AST 29 36  ALT 24 29  ALKPHOS 97 110  BILITOT 0.6 0.8  PROT 6.2* 6.3*  ALBUMIN 3.0* 3.2*   No results for input(s): "LIPASE", "AMYLASE" in the last 168 hours. No results for input(s): "AMMONIA" in the last 168 hours. Coagulation Profile: No results for input(s): "INR", "PROTIME" in the last 168 hours. Cardiac Enzymes: No results for  input(s): "CKTOTAL", "CKMB", "CKMBINDEX", "TROPONINI" in the last 168 hours. BNP (last 3 results) No results for input(s): "PROBNP" in the last 8760 hours. HbA1C: No results for input(s): "HGBA1C" in the last 72 hours. CBG: Recent Labs  Lab 11/06/22 1202 11/06/22 1639 11/06/22 2106 11/07/22 0605 11/07/22 1253  GLUCAP 139* 96 215* 85 110*   Lipid Profile: No results for input(s): "CHOL", "HDL", "LDLCALC", "TRIG", "CHOLHDL", "LDLDIRECT" in the last 72 hours. Thyroid Function Tests: No results for input(s): "TSH", "T4TOTAL", "FREET4", "T3FREE", "THYROIDAB" in the last 72 hours. Anemia Panel: No results for input(s): "VITAMINB12", "FOLATE", "FERRITIN", "TIBC", "IRON", "RETICCTPCT" in the last 72 hours. Sepsis  Labs: No results for input(s): "PROCALCITON", "LATICACIDVEN" in the last 168 hours.  No results found for this or any previous visit (from the past 240 hour(s)).       Radiology Studies: No results found.      Scheduled Meds:  apixaban  5 mg Oral BID   atorvastatin  80 mg Oral Daily   carvedilol  3.125 mg Oral BID WC   famotidine  20 mg Oral BID   feeding supplement (JEVITY 1.5 CAL/FIBER)  300 mL Per Tube TID PC & HS   fiber supplement (BANATROL TF)  60 mL Per Tube BID   FLUoxetine  10 mg Oral Daily   free water  60 mL Per Tube TID PC & HS   gabapentin  300 mg Oral QHS   Gerhardt's butt cream   Topical BID   insulin aspart  0-5 Units Subcutaneous QHS   insulin aspart  0-6 Units Subcutaneous TID WC   multivitamin with minerals  1 tablet Oral Daily   nicotine  7 mg Transdermal Daily   nystatin   Topical TID   polyethylene glycol  17 g Oral BID   spironolactone  12.5 mg Oral Daily   Continuous Infusions:  sodium chloride 10 mL/hr at 10/22/22 1331     LOS: 36 days    Time spent: 35 minutes    Dorcas Carrow, MD [facility]

## 2022-11-07 NOTE — Progress Notes (Signed)
Patient ID: [name], male   DOB: 10/31/1960, 62 y.o.   MRN: [id number]    Progress Note from the Palliative Medicine Team at [facility]   Patient Name: [name]        Date: 11/07/2022 DOB: December 03, 1960  Age: 62 y.o. MRN#: [id number] Attending Physician: Dorcas Carrow, MD Primary Care Physician: [name], MD Admit Date: 10/02/2022   Extensive chart review has been completed prior to meeting with patient/family  including labs, vital signs, imaging, progress/consult notes, orders, medications and available advance directive documents.   Mr. Bowlby is a 62 y.o. 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  Remains in the hospital.  Pending placement.  Hospital course complicated by hemorrhagic conversion of the stroke.   6/18: Admitted for stroke, received TNK; developed right sided deficits overnight after TNK, underwent NIR thrombectomy 6/19: Post thrombectomy CT showed left frontal IPH and SAH 6/20: Heparin started cautiously 6/22: CT showed progression of hematomas, heparin stopped 6/23: Repeat echo shows dehiscence of prior pseudoaneurysm patch with adherent LV thrombus; Cardiology consulted 6/26: ID consulted for MSSE bacteremia 6/28: EP consulted, recommended not to take out ICD; CT stabilized, heparin restarted 7/2: Transferred OOU 7/10: Placed PEG tube 7/11: Transitioned to basal bolus feedings and switched to Eliquis 7/13: Increased HA, repeat CT shows progression of ICHs; Eliquis held 7/15: TEE negative, antibiotics stopped 7/17: Eliquis restarted   This NP assessed patient at the bedside as a follow up palliative medicine needs and emotional support.  Patient is alert, sitting up in bed and enjoying breakfast with the assistance from CNA.  Patient is communicating with head nods  hand gestures appropriately.   Plan is for discharge to skilled nursing facility for ongoing rehabilitation.  At this time family is open to all offered and available medical interventions to prolong life.   Plan of Care: -DNR/DNI- documented today -continue with all offered and available medical interventions to prolong life -Ongoing conversations with medical team and family members regarding goals of care dependent on patient outcomes.  Today Octavious/son and I again discussed the importance of continuing to evaluate patient outcomes within the context of patient's values and goals of care.  Patient remains high risk for decompensation    PMT will sign  off at this time.   Questions and concerns addressed  Time: 35   minutes  [name] NP  Palliative Medicine Team Team Phone # [phone] Pager [phone]

## 2022-11-07 NOTE — Progress Notes (Signed)
Physical Therapy Treatment  Patient Details Name: [name] MRN: [id number] DOB: 08/31/1960 Today's Date: 11/07/2022   History of Present Illness Patient is a 62 y/o male admitted 10/03/22 with L weakness and numbness.  Received TNK then developed now symptoms of aphasia and R weakness.  He underwent mechanical thrombectomy L M3 segment MCA noted to have small hemorrhage and follow up CT showed progression of hemorrhage 3cm then 7cm and small acute R cerebellar infarct. PEG placed 7/10. CT on 7/13 showed slight diminished size of largest left frontal parietal lobe hematoma, but increased size of left periventricular hematoma, and several small new acute appearing hemorrhages posterior to this, and also in the right occipital lobe, left occipital lobe, left cerebellum and some hemorrhage layering in the bilateral occipital horns PMH positive for CAD s/p PCI w/stent and 1v CABG, multiple CGAs, systolic HF s/p ICD, HLD, HTN.    PT Comments  Pt significantly limited by pain this session. Rn notified that pt requesting pain meds. Pt states "all over" and unable to differentiate where the pain was originating. Attempted several times to sit EOB with pt pushing back into trunk extension due to pain. On third attempt pt able to sit supported and appeared comfortable, however with placement of feet on the Stedy to prepare for OOB, pt again pushing back into extension, crying out in pain. Of note, pt has had R hip pain in the past however today appeared to be more on the L side. Pt was returned to the bed and had a very large BM at end of session. Will continue to follow. Pt reports hopeful for OOB next session.     Assistance Recommended at Discharge Frequent or constant Supervision/Assistance  If plan is discharge home, recommend the following:  Can travel by private vehicle    Two people to help with walking and/or transfers;A lot of help with bathing/dressing/bathroom;Assistance with  cooking/housework;Direct supervision/assist for medications management;Assist for transportation;Help with stairs or ramp for entrance;Assistance with feeding   No  Equipment Recommendations  Other (comment) (to be determined)    Recommendations for Other Services Rehab consult     Precautions / Restrictions Precautions Precautions: Fall;Other (comment) Precaution Comments: PEG Restrictions Weight Bearing Restrictions: No     Mobility  Bed Mobility Overal bed mobility: Needs Assistance Bed Mobility: Rolling, Sit to Sidelying, Sidelying to Sit Rolling: Max assist Sidelying to sit: Max assist, +2 for physical assistance, +2 for safety/equipment   Sit to supine: Mod assist, +2 for physical assistance   General bed mobility comments: Several attempts to sit EOB with pt pushing back into trunk extension due to pain. Pt with difficulty differentiating where he was hurting, just stating "all over". Attempted to place [facility] and pt unable to tolerate.    Transfers                   General transfer comment: Unable to progress to OOB this date.    Ambulation/Gait               General Gait Details: Unable to progress to gait training at this time.   Stairs             Wheelchair Mobility     Tilt Bed    Modified Rankin (Stroke Patients Only) Modified Rankin (Stroke Patients Only) Pre-Morbid Rankin Score: Moderate disability Modified Rankin: Severe disability     Balance Overall balance assessment: Needs assistance Sitting-balance support: Single extremity supported, Feet supported Sitting balance-Leahy Scale: Poor  Sitting balance - Comments: min guard to intermittent Min A when fatigued or distracted by pain(?) Postural control: Posterior lean, Right lateral lean Standing balance support: Bilateral upper extremity supported Standing balance-Leahy Scale: Poor Standing balance comment: Unable to stand upright.                             Cognition Arousal/Alertness: Awake/alert Behavior During Therapy: WFL for tasks assessed/performed Overall Cognitive Status: Difficult to assess Area of Impairment: Attention, Safety/judgement, Awareness, Following commands, Problem solving, Memory                   Current Attention Level: Selective Memory: Decreased short-term memory Following Commands: Follows one step commands with increased time, Follows one step commands inconsistently Safety/Judgement: Decreased awareness of deficits, Decreased awareness of safety Awareness: Intellectual, Emergent Problem Solving: Slow processing, Requires verbal cues, Requires tactile cues General Comments: speech with continued improvements, able to understand at least 75% of pt speech/needs expression. follows directions inconsistently in regards to RUE, often moving LUE to perform action. cues for safety with self feeding needed, as well as problem solving        Exercises Other Exercises Other Exercises: Attempted R hand finger extension to neutral in all joints. Pt unable to tolerate prolonged stretch due to pain.    General Comments General comments (skin integrity, edema, etc.): Pt with very large BM upon return to bed.      Pertinent Vitals/Pain Pain Assessment Pain Assessment: Faces Faces Pain Scale: Hurts worst Pain Location: Appears to be R digits with attempts to extend, hips, back. Pt crying out in pain and unable to maintain sitting up because of pain. Pt with difficulty pinpointing pain. He states "all over". Pain Descriptors / Indicators: Grimacing, Guarding, Moaning Pain Intervention(s): Limited activity within patient's tolerance, Monitored during session, Repositioned, Patient requesting pain meds-RN notified    Home Living                          Prior Function            PT Goals (current goals can now be found in the care plan section) Acute Rehab PT Goals Patient Stated Goal: Decrease  pain PT Goal Formulation: Patient unable to participate in goal setting Time For Goal Achievement: 11/13/22 Potential to Achieve Goals: Fair Progress towards PT goals: Progressing toward goals    Frequency    Min 1X/week      PT Plan Current plan remains appropriate    Co-evaluation              AM-PAC PT "6 Clicks" Mobility   Outcome Measure  Help needed turning from your back to your side while in a flat bed without using bedrails?: A Lot Help needed moving from lying on your back to sitting on the side of a flat bed without using bedrails?: Total Help needed moving to and from a bed to a chair (including a wheelchair)?: Total Help needed standing up from a chair using your arms (e.g., wheelchair or bedside chair)?: Total Help needed to walk in hospital room?: Total Help needed climbing 3-5 steps with a railing? : Total 6 Click Score: 7    End of Session   Activity Tolerance: Patient limited by pain Patient left: with call bell/phone within reach;in bed;with bed alarm set;with SCD's reapplied Nurse Communication: Mobility status;Need for lift equipment PT Visit Diagnosis: Other abnormalities of gait  and mobility (R26.89);Other symptoms and signs involving the nervous system (R29.898);Hemiplegia and hemiparesis Hemiplegia - Right/Left: Right Hemiplegia - dominant/non-dominant: Dominant Hemiplegia - caused by: Cerebral infarction;Other Nontraumatic intracranial hemorrhage     Time: 1132-1157 PT Time Calculation (min) (ACUTE ONLY): 25 min  Charges:    $Therapeutic Activity: 23-37 mins PT General Charges $$ ACUTE PT VISIT: 1 Visit                     Conni Slipper, PT, DPT Acute Rehabilitation Services Secure Chat Preferred Office: [phone]    Marylynn Pearson 11/07/2022, 2:06 PM

## 2022-11-07 NOTE — TOC Progression Note (Signed)
Transition of Care [facility]) - Initial/Assessment Note    Patient Details  Name: [name] MRN: [id number] Date of Birth: Sep 18, 1960  Transition [facility] Care [facility]) CM/SW Contact:    Ralene Bathe, LCSW Phone Number: 11/07/2022, 2:09 PM  Clinical Narrative:                 LCSW informed by MD that the spouse reports that the son's facility of choice is [facility].  LCSW LM for Marcelino Duster, admissions director for the facility, and is awaiting a returned call.    TOC following.   Expected Discharge Plan: Skilled Nursing Facility Barriers to Discharge: Continued Medical Work up, English as a second language teacher, SNF Pending bed offer   Patient Goals and CMS Choice            Expected Discharge Plan and Services In-house Referral: Clinical Social Work     Living arrangements for the past 2 months: Apartment                                      Prior Living Arrangements/Services Living arrangements for the past 2 months: Apartment Lives with:: Significant Other Patient language and need for interpreter reviewed:: Yes Do you feel safe going back to the place where you live?: Yes      Need for Family Participation in Patient Care: Yes (Comment) Care giver support system in place?: No (comment)   Criminal Activity/Legal Involvement Pertinent to Current Situation/Hospitalization: No - Comment as needed  Activities of Daily Living Home Assistive Devices/Equipment: None ADL Screening (condition at time of admission) Patient's cognitive ability adequate to safely complete daily activities?: No Is the patient deaf or have difficulty hearing?: No Does the patient have difficulty seeing, even when wearing glasses/contacts?: No Does the patient have difficulty concentrating, remembering, or making decisions?: Yes Patient able to express need for assistance with ADLs?: No Does the patient have difficulty dressing or bathing?: Yes Independently performs ADLs?: No Communication:  Dependent Is this a change from baseline?: Change from baseline, expected to last >3 days Dressing (OT): Dependent Is this a change from baseline?: Change from baseline, expected to last >3 days Grooming: Dependent Is this a change from baseline?: Change from baseline, expected to last >3 days Feeding: Dependent Is this a change from baseline?: Change from baseline, expected to last >3 days Bathing: Dependent Is this a change from baseline?: Change from baseline, expected to last >3 days Toileting: Dependent Is this a change from baseline?: Change from baseline, expected to last >3days In/Out Bed: Dependent Is this a change from baseline?: Change from baseline, expected to last >3 days Walks in Home: Dependent Is this a change from baseline?: Change from baseline, expected to last >3 days Does the patient have difficulty walking or climbing stairs?: Yes Weakness of Legs: Both Weakness of Arms/Hands: Both  Permission Sought/Granted Permission sought to share information with : Facility Medical sales representative, Family Supports                Emotional Assessment Appearance:: Appears stated age Attitude/Demeanor/Rapport: Unable to Assess Affect (typically observed): Unable to Assess Orientation: :  (Follows commands) Alcohol / Substance Use: Not Applicable Psych Involvement: No (comment)  Admission diagnosis:  Stroke (cerebrum) [facility]) [I63.9] Patient Active Problem List   Diagnosis Date Noted   Aspiration pneumonia (HCC) 10/24/2022   Thrombocytopenia (HCC) 10/24/2022   Cerebral edema (HCC) 10/24/2022   Bacteremia due to Gram-positive  bacteria 10/16/2022   Stroke (cerebrum) (HCC) 10/02/2022   Cerebellar stroke (HCC) 08/06/2022   Cryptogenic stroke (HCC) 08/06/2022   At risk for obstructive sleep apnea 08/06/2022   Tobacco use disorder 08/06/2022   Abnormality of gait due to impairment of balance 08/06/2022   TIA (transient ischemic attack) 05/04/2022   Acute on chronic  systolic CHF (congestive heart failure) (HCC) 05/04/2022   Expressive dysphasia 05/04/2022   Unsteady gait 05/04/2022   Near syncope 05/04/2022   Chronic systolic heart failure (HCC) 12/01/2021   ICD (implantable cardioverter-defibrillator) in place 12/01/2021   Rectal bleed 03/06/2021   Unstable angina (HCC)    Hx of CABG 07/09/2019   Lumbar radiculopathy 12/04/2018   Glaucoma 12/04/2018   Cortical age-related cataract of both eyes 12/04/2018   Pain management contract agreement 12/04/2018   Lumbar herniated disc 06/04/2018   Prediabetes 01/01/2018   Obesity (BMI 30.0-34.9) 12/30/2017   Benign neoplasm of descending colon    Essential hypertension 11/03/2015   Cardiomyopathy, ischemic 06/03/2015   Chronic systolic dysfunction of left ventricle 06/03/2015   Erectile dysfunction due to arterial insufficiency 12/23/2014   Lipoma of skin and subcutaneous tissue of neck 05/24/2014   History of cardioembolic stroke 11/02/2013   Vision loss of right eye 06/23/2013   Dyslipidemia 06/22/2013   Acute respiratory failure with hypoxia (HCC)    Dressler syndrome (HCC)    Coronary artery disease    Cardiomyopathy (HCC)    Dysphagia    Pseudoaneurysm of left ventricle of heart    Tobacco abuse    PCP:  [name], MD Pharmacy:   [facility] [phone] [place], [place] - [address] AT [facility] [address] [place] [address] Phone: [phone] Fax: [phone]  [facility] - [place], [place] - [address] [place] [address] Phone: [phone] Fax: [phone]  [facility] [address] [place] [address] Phone: [phone] Fax: [phone]     Social Determinants of Health (SDOH) Social History: SDOH Screenings   Food Insecurity: Patient Unable To Answer (10/11/2022)  Housing: Patient Unable To Answer (10/11/2022)  Transportation Needs:  Patient Unable To Answer (10/11/2022)  Utilities: Patient Unable To Answer (10/11/2022)  Depression (PHQ2-9): Low Risk  (05/08/2022)  Social Connections: Unknown (08/28/2021)   Received from [facility]  Tobacco Use: High Risk (10/29/2022)   SDOH Interventions:     Readmission Risk Interventions     No data to display

## 2022-11-08 DIAGNOSIS — G936 Cerebral edema: Secondary | ICD-10-CM | POA: Diagnosis not present

## 2022-11-08 DIAGNOSIS — R1310 Dysphagia, unspecified: Secondary | ICD-10-CM | POA: Diagnosis not present

## 2022-11-08 DIAGNOSIS — I5023 Acute on chronic systolic (congestive) heart failure: Secondary | ICD-10-CM | POA: Diagnosis not present

## 2022-11-08 LAB — GLUCOSE, CAPILLARY
Glucose-Capillary: 75 mg/dL (ref 70–99)
Glucose-Capillary: 151 mg/dL — ABNORMAL HIGH (ref 70–99)
Glucose-Capillary: 93 mg/dL (ref 70–99)
Glucose-Capillary: 196 mg/dL — ABNORMAL HIGH (ref 70–99)

## 2022-11-08 NOTE — Progress Notes (Signed)
Physical Therapy Treatment Patient Details Name: [name] MRN: [id number] DOB: Feb 08, 1961 Today's Date: 11/08/2022   History of Present Illness Patient is a 62 y/o male admitted 10/03/22 with L weakness and numbness.  Received TNK then developed now symptoms of aphasia and R weakness.  He underwent mechanical thrombectomy L M3 segment MCA noted to have small hemorrhage and follow up CT showed progression of hemorrhage 3cm then 7cm and small acute R cerebellar infarct. PEG placed 7/10. CT on 7/13 showed slight diminished size of largest left frontal parietal lobe hematoma, but increased size of left periventricular hematoma, and several small new acute appearing hemorrhages posterior to this, and also in the right occipital lobe, left occipital lobe, left cerebellum and some hemorrhage layering in the bilateral occipital horns PMH positive for CAD s/p PCI w/stent and 1v CABG, multiple CGAs, systolic HF s/p ICD, HLD, HTN.    PT Comments  Patient resting in bed and denies pain at start of session. 2+ to boost superior in bed at start and adjusted to chair position. Pt completed A/AROM exercises for bil LE, pt has tendency to activation Lt LE during Rt LE task, pt reliant on contralateral activation to initiate Rt LE movement. Patient attempts to initaite moving LE's off EOB. Max assist to pivot hips with bed pad and faciliate Lt hand placement on bed rail to pull up trunk. pt making good effort to pull with Lt UE to pivot shoulders to sit EOB. Once EOB pt able to maintain balance with min guard occasional assist to prevent post LOB with LE exs. Pt performed box taps with bil LE, manual facilitation of Rt hip flexion improved pt's ability to extend at knee and DF ankle for box taps. Cognitive challenge provided to count reps out loud and pt required cues to count each rep. EOS pt returned to bed and repositioned for comfort. Will continue to progress as able.    Assistance Recommended at Discharge  Frequent or constant Supervision/Assistance  If plan is discharge home, recommend the following:  Can travel by private vehicle    Two people to help with walking and/or transfers;A lot of help with bathing/dressing/bathroom;Assistance with cooking/housework;Direct supervision/assist for medications management;Assist for transportation;Help with stairs or ramp for entrance;Assistance with feeding   No  Equipment Recommendations   (TBD at next venue)    Recommendations for Other Services       Precautions / Restrictions Precautions Precautions: Fall;Other (comment) Precaution Comments: PEG Restrictions Weight Bearing Restrictions: No     Mobility  Bed Mobility Overal bed mobility: Needs Assistance Bed Mobility: Rolling, Sit to Supine, Sidelying to Sit Rolling: Max assist Sidelying to sit: Max assist, HOB elevated   Sit to supine: Max assist   General bed mobility comments: bed adjusted to chair position and pt making attempts to initaite moving LE's off EOB. Max assist to pivot hips with bed pad and faciliate Lt hand placement on bed rail to pull up trunk. pt making good effort to pull with Lt UE to pivot shoulders to sit EOB. Max/total assist to return to supine due to pain with lowering to Rt side and assist needed to raise bil LE's. Mod assist to roll Rt and Max assist to roll Lt for repositioning in bed. pt initaited flexed LE's for roll and reaching Lt UE up but assist needed to move across midline to rail.    Transfers  General transfer comment: unsafe to stand without +2 assist    Ambulation/Gait                   Stairs             Wheelchair Mobility     Tilt Bed    Modified Rankin (Stroke Patients Only)       Balance Overall balance assessment: Needs assistance Sitting-balance support: Single extremity supported, Feet supported Sitting balance-Leahy Scale: Fair Sitting balance - Comments: min guard to  intermittent Min A when fatigued or distracted by pain(?)                                    Cognition Arousal/Alertness: Awake/alert Behavior During Therapy: WFL for tasks assessed/performed Overall Cognitive Status: Difficult to assess Area of Impairment: Attention, Safety/judgement, Awareness, Following commands, Problem solving, Memory                   Current Attention Level: Selective Memory: Decreased short-term memory Following Commands: Follows one step commands with increased time, Follows one step commands inconsistently Safety/Judgement: Decreased awareness of deficits, Decreased awareness of safety Awareness: Intellectual, Emergent Problem Solving: Slow processing, Requires verbal cues, Requires tactile cues General Comments: speech with continued improvements, able to understand at least 75% of pt speech/needs expression. follows directions inconsistently.        Exercises General Exercises - Lower Extremity Short Arc Quad: AAROM, Both, 10 reps Heel Slides: AAROM, Both, 10 reps Other Exercises Other Exercises: 6" box taps in sitting: 10 reps Lt LE AROM (cog challenge for pt to count reps outloud- cues needed to correct count 2x and cue to state rep out load each rep) 10x AAROM on Rt LE, Manual facilitation of hip flexion and pt able to extend LE and DF to tap box with cue "kick" then able to acivate hamstrings to bring LE back to ground off box.    General Comments        Pertinent Vitals/Pain Pain Assessment Pain Assessment: Faces Faces Pain Scale: Hurts little more Pain Location: hips and back with mobility to EOB Pain Descriptors / Indicators: Discomfort, Grimacing Pain Intervention(s): Limited activity within patient's tolerance, Monitored during session, Repositioned    Home Living                          Prior Function            PT Goals (current goals can now be found in the care plan section) Acute Rehab PT  Goals Patient Stated Goal: Decrease pain PT Goal Formulation: Patient unable to participate in goal setting Time For Goal Achievement: 11/13/22 Potential to Achieve Goals: Fair Progress towards PT goals: Progressing toward goals    Frequency    Min 1X/week      PT Plan Current plan remains appropriate    Co-evaluation              AM-PAC PT "6 Clicks" Mobility   Outcome Measure  Help needed turning from your back to your side while in a flat bed without using bedrails?: A Lot Help needed moving from lying on your back to sitting on the side of a flat bed without using bedrails?: Total Help needed moving to and from a bed to a chair (including a wheelchair)?: Total Help needed standing up from a chair using your arms (e.g.,  wheelchair or bedside chair)?: Total Help needed to walk in hospital room?: Total Help needed climbing 3-5 steps with a railing? : Total 6 Click Score: 7    End of Session   Activity Tolerance: Patient tolerated treatment well Patient left: in bed;with call bell/phone within reach;with bed alarm set Nurse Communication: Mobility status;Need for lift equipment PT Visit Diagnosis: Other abnormalities of gait and mobility (R26.89);Other symptoms and signs involving the nervous system (R29.898);Hemiplegia and hemiparesis Hemiplegia - Right/Left: Right Hemiplegia - dominant/non-dominant: Dominant Hemiplegia - caused by: Cerebral infarction;Other Nontraumatic intracranial hemorrhage     Time: 1610-9604 PT Time Calculation (min) (ACUTE ONLY): 32 min  Charges:    $Therapeutic Activity: 8-22 mins $Neuromuscular Re-education: 8-22 mins PT General Charges $$ ACUTE PT VISIT: 1 Visit                     Wynn Maudlin, DPT Acute Rehabilitation Services Office [phone]  11/08/22 3:50 PM

## 2022-11-08 NOTE — Progress Notes (Signed)
PROGRESS NOTE    [name]  WGN:[id number] DOB: 08-Sep-1960 DOA: 10/02/2022 PCP: [name], MD    Brief Narrative:  Mr. Anglemyer is a 62 y.o. 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  Remains in the hospital.  Hospital course complicated by hemorrhagic conversion of the stroke. Improving now, waiting for SNF  6/18: Admitted for stroke, received TNK; developed right sided deficits overnight after TNK, underwent NIR thrombectomy 6/19: Post thrombectomy CT showed left frontal IPH and SAH 6/20: Heparin started cautiously 6/22: CT showed progression of hematomas, heparin stopped 6/23: Repeat echo shows dehiscence of prior pseudoaneurysm patch with adherent LV thrombus; Cardiology consulted 6/26: ID consulted for MSSE bacteremia 6/28: EP consulted, recommended not to take out ICD; CT stabilized, heparin restarted 7/2: Transferred [facility] 7/10: Placed PEG tube 7/11: Transitioned to basal bolus feedings and switched to Eliquis 7/13: Increased HA, repeat CT shows progression of ICHs; Eliquis held 7/15: TEE negative, antibiotics stopped 7/17: Eliquis restarted  Significant studies: 6/18 CTA head and neck: no LVO 6/18 repeat CTA head and neck: new distal left MCA M2 occlusion 6/19 Echo: poor quality images, nondiagnostic 6/19 TCD: positive study, indicating R>L shunt 6/19 LE doppler: no DVT 6/19 CT head: left frontal parietal IPH and adjacent [facility] 6/22 CT head: size now 8x4x4cm, with new 2cm component 6/23 complete echo: now shows recurrence of pseudoaneurysm, dehiscence of patch with adherent thrombus 6/25 coronary CTA: confirms above 6/28 CT head: stable large left frontoparietal hematoma 7/13: CT head shows worsening ICH 7/15 TEE: shows pseudoaneurysm with adherent clot, no valvular vegetations, no signs of  vegetation on ICD leads 7/16: CT head shows regression of ICH noted 7/13 7/19: CT head shows no change in ICH     Significant microbiology data: 6/18 MRSA nares: Negative 6/25 Blood cultures: MSSE in 2/2 6/27 blood cultures: NG in 2/2     Procedures: 6/18: TNK at 1246 6/19: Thrombectomy by Dr. Tommie Sams 6/19: Cortrak placed 7/10: PEG placed 7/15: TEE  Assessment & Plan:   Acute stroke with intraparenchymal hematoma: Patient admitted and underwent TNK.  Later same day had second stroke with left M2 occlusion and underwent thrombectomy.    Source of both likely cardioembolic from pseudoaneurysm.  Unfortunately, post-TNK/thrombectomy course complicated by ICH and SAH.   Heparin was started due to pseudoaneurysm but ICH size increased so this was held for 1 week.  Serial CTs eventually showed stabilization, transitioned to heparin again, and finally Eliquis. Continue Lipitor.   Bacteremia due to Gram-positive bacteria Treated with 2 weeks IV cefazolin.  ID consulted.  TEE showed no vegetations and no complications with ICD leads.  Completed antibiotic therapy.   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  Stable by echo as recently as Jan of this year.   After admission, Echo showed the patch dehisced and he had adherent thrombus.  Cardiology were consulted and recommended he would need long term anticoagulation and that ultimately, if his neurologic recovery allowed and he were felt to be healthy enough, repeat pseudoaneurysm repair would be indicated (a very high risk operation)    Acute on chronic systolic CHF (congestive heart failure) (HCC) Treated and now appears euvolemic - Continue Entresto, BB, spiro, Lasix   Essential hypertension -  Continue Entresto, Coreg, Lasix, metoprolol, spironolactone   Dyslipidemia - Continue Lipitor   Dysphagia PEG placed 7/10.   - Continue bolus feeds - SS  correction insulin with feeds -- Patient is tolerating dysphagia diet now, encourage oral intake.   Thrombocytopenia (HCC) Resolved.   Aspiration pneumonia (HCC) Treated with Rocephin to cefazolin, now resolevd.   ICD (implantable cardioverter-defibrillator) in place  Groin itchiness: Without any lesions.  Local therapy with Benadryl and steroids.   Started on fluoxetine 10 mg daily.  Excellent response with SSRI. Using flexeril and tylenol for generalized bodyache.  Medically stable.  Waiting for placement.     DVT prophylaxis:  apixaban (ELIQUIS) tablet 5 mg   Code Status: DNR Family Communication: Significant other on the phone 7/24 Disposition Plan: Status is: Inpatient Remains inpatient appropriate because: Pending placement.  Medically stable.     Consultants:  Neurology Palliative care  Procedures:  Multiple procedures above  Antimicrobials:  Completed   Subjective:  Patient seen and examined.  Gives me a fist bump stating no complaints.  Objective: Vitals:   11/08/22 0000 11/08/22 0343 11/08/22 0500 11/08/22 0838  BP: 111/80 116/81  114/72  Pulse: 87 74  83  Resp: 18 18    Temp: 98.3 F (36.8 C) 97.7 F (36.5 C)  98.6 F (37 C)  TempSrc: Oral Oral  Oral  SpO2: 99% 100%  100%  Weight:   99.9 kg   Height:        Intake/Output Summary (Last 24 hours) at 11/08/2022 1130 Last data filed at 11/08/2022 0835 Gross per 24 hour  Intake --  Output 1400 ml  Net -1400 ml   Filed Weights   11/06/22 0416 11/07/22 0500 11/08/22 0500  Weight: 99.5 kg 99.8 kg 99.9 kg    Examination:  General: Chronically sick looking.  Looks comfortable and interactive but severely dysarthric. Cardiovascular: S1-S2 normal.  Regular rate rhythm. Respiratory: Bilateral clear.  No added sounds. Gastrointestinal: Soft.  Nontender.  Bowel sound present.  PEG tube present. Ext: Right-sided hemiparesis, dysarthria.  Follows commands.  He is appropriately communicating but  difficult to understand due to dysarthria.   Data Reviewed: I have personally reviewed following labs and imaging studies  CBC: Recent Labs  Lab 11/02/22 1105 11/04/22 0403  WBC 6.6 5.8  NEUTROABS 4.0  --   HGB 12.1* 11.4*  HCT 35.5* 34.1*  MCV 87.4 88.1  PLT 121* 141*   Basic Metabolic Panel: Recent Labs  Lab 11/02/22 1105 11/04/22 0403  NA 131* 132*  K 3.9 5.1  CL 97* 99  CO2 25 22  GLUCOSE 131* 101*  BUN 18 17  CREATININE 0.84 0.82  CALCIUM 9.0 8.9   GFR: Estimated Creatinine Clearance: 117.6 mL/min (by C-G formula based on SCr of 0.82 mg/dL). Liver Function Tests: Recent Labs  Lab 11/04/22 0403  AST 36  ALT 29  ALKPHOS 110  BILITOT 0.8  PROT 6.3*  ALBUMIN 3.2*   No results for input(s): "LIPASE", "AMYLASE" in the last 168 hours. No results for input(s): "AMMONIA" in the last 168 hours. Coagulation Profile: No results for input(s): "INR", "PROTIME" in the last 168 hours. Cardiac Enzymes: No results for input(s): "CKTOTAL", "CKMB", "CKMBINDEX", "TROPONINI" in the last 168 hours. BNP (last 3 results) No results for input(s): "PROBNP" in the last 8760 hours. HbA1C: No results for input(s): "HGBA1C" in the last 72 hours. CBG: Recent Labs  Lab 11/07/22 0605 11/07/22 1253 11/07/22 1613 11/07/22 2115 11/08/22 0626  GLUCAP 85 110* 119*  108* 75   Lipid Profile: No results for input(s): "CHOL", "HDL", "LDLCALC", "TRIG", "CHOLHDL", "LDLDIRECT" in the last 72 hours. Thyroid Function Tests: No results for input(s): "TSH", "T4TOTAL", "FREET4", "T3FREE", "THYROIDAB" in the last 72 hours. Anemia Panel: No results for input(s): "VITAMINB12", "FOLATE", "FERRITIN", "TIBC", "IRON", "RETICCTPCT" in the last 72 hours. Sepsis Labs: No results for input(s): "PROCALCITON", "LATICACIDVEN" in the last 168 hours.  No results found for this or any previous visit (from the past 240 hour(s)).       Radiology Studies: No results found.      Scheduled Meds:   apixaban  5 mg Oral BID   atorvastatin  80 mg Oral Daily   carvedilol  3.125 mg Oral BID WC   famotidine  20 mg Oral BID   feeding supplement (JEVITY 1.5 CAL/FIBER)  300 mL Per Tube TID PC & HS   fiber supplement (BANATROL TF)  60 mL Per Tube BID   FLUoxetine  10 mg Oral Daily   free water  60 mL Per Tube TID PC & HS   gabapentin  300 mg Oral QHS   Gerhardt's butt cream   Topical BID   insulin aspart  0-5 Units Subcutaneous QHS   insulin aspart  0-6 Units Subcutaneous TID WC   multivitamin with minerals  1 tablet Oral Daily   nicotine  7 mg Transdermal Daily   nystatin   Topical TID   polyethylene glycol  17 g Oral BID   spironolactone  12.5 mg Oral Daily   Continuous Infusions:  sodium chloride 10 mL/hr at 10/22/22 1331     LOS: 37 days    Time spent: 35 minutes    Dorcas Carrow, MD [facility]

## 2022-11-08 NOTE — TOC Progression Note (Addendum)
Transition of Care [facility]) - Progression Note    Patient Details  Name: [name] MRN: [id number] Date of Birth: June 10, 1960  Transition of Care [facility]) CM/SW Contact  Dellie Burns [place], [place] Phone Number: 11/08/2022, 10:17 AM  Clinical Narrative:  Confirmed bed with Marcelino Duster at [facility]. Requested CMA begin auth. TOC will follow.   UPDATE 1105: per CMA, pt's [employer] is not managed by [employer]. Updated Marcelino Duster in [facility] admissions and requested they begin auth.   Dellie Burns, MSW, LCSW [phone] (coverage)       Expected Discharge Plan: Skilled Nursing Facility Barriers to Discharge: Continued Medical Work up, English as a second language teacher, SNF Pending bed offer  Expected Discharge Plan and Services In-house Referral: Clinical Social Work     Living arrangements for the past 2 months: Apartment                                       Social Determinants of Health (SDOH) Interventions SDOH Screenings   Food Insecurity: Patient Unable To Answer (10/11/2022)  Housing: Patient Unable To Answer (10/11/2022)  Transportation Needs: Patient Unable To Answer (10/11/2022)  Utilities: Patient Unable To Answer (10/11/2022)  Depression (PHQ2-9): Low Risk  (05/08/2022)  Social Connections: Unknown (08/28/2021)   Received from [facility]  Tobacco Use: High Risk (10/29/2022)    Readmission Risk Interventions     No data to display

## 2022-11-08 NOTE — Plan of Care (Signed)

## 2022-11-08 NOTE — Progress Notes (Signed)
Occupational Therapy Treatment Patient Details Name: [name] MRN: [id number] DOB: 03/27/1961 Today's Date: 11/08/2022   History of present illness Patient is a 62 y/o male admitted 10/03/22 with L weakness and numbness.  Received TNK then developed now symptoms of aphasia and R weakness.  He underwent mechanical thrombectomy L M3 segment MCA noted to have small hemorrhage and follow up CT showed progression of hemorrhage 3cm then 7cm and small acute R cerebellar infarct. PEG placed 7/10. CT on 7/13 showed slight diminished size of largest left frontal parietal lobe hematoma, but increased size of left periventricular hematoma, and several small new acute appearing hemorrhages posterior to this, and also in the right occipital lobe, left occipital lobe, left cerebellum and some hemorrhage layering in the bilateral occipital horns PMH positive for CAD s/p PCI w/stent and 1v CABG, multiple CGAs, systolic HF s/p ICD, HLD, HTN.   OT comments  Session focused on continued RUE function and trial of AE during meals. With use of plastic cup w/ lid/handle and built up foam grips, pt with improving ability to grasp items and bring food/drink to mouth. Due to incoordination and involuntary movements of LUE, hand over hand assist still needed when using BUE during meals. Pt able to independently demonstrate self ROM to stretch R digits into extension today. Patient will benefit from continued inpatient follow up therapy, <3 hours/day.   Recommendations for follow up therapy are one component of a multi-disciplinary discharge planning process, led by the attending physician.  Recommendations may be updated based on patient status, additional functional criteria and insurance authorization.    Assistance Recommended at Discharge Frequent or constant Supervision/Assistance  Patient can return home with the following  Two people to help with walking and/or transfers;Two people to help with  bathing/dressing/bathroom;Assistance with cooking/housework;Assistance with feeding;Help with stairs or ramp for entrance;Direct supervision/assist for financial management;Assist for transportation;Direct supervision/assist for medications management   Equipment Recommendations  Wheelchair cushion (measurements OT);Wheelchair (measurements OT);Other (comment) (hoyer lift)    Recommendations for Other Services      Precautions / Restrictions Precautions Precautions: Fall;Other (comment) Precaution Comments: PEG Restrictions Weight Bearing Restrictions: No       Mobility Bed Mobility Overal bed mobility: Needs Assistance             General bed mobility comments: able to assist in scoot up with LLE in bed, Mod A to reposition trunk to midline    Transfers                         Balance                                           ADL either performed or assessed with clinical judgement   ADL Overall ADL's : Needs assistance/impaired Eating/Feeding: Minimal assistance;Moderate assistance;Sitting;With adaptive utensils Eating/Feeding Details (indicate cue type and reason): use of plastic cup with lid and hand + straw- hand over hand assist with pt able to grasp with R hand and support to bring to mouth. Able to use L UE in similar technqiue. trial of built up foam grip on utensils with pt able to grasp utensil more easily though hand over hand provided due to decreased control of UE  General ADL Comments: Emphasis on R UE function, importance of stretching and self feeding strategies with trial of AE    Extremity/Trunk Assessment Upper Extremity Assessment Upper Extremity Assessment: RUE deficits/detail;LUE deficits/detail RUE Deficits / Details: able to  make fist and extend digits actively though increased stiffness noted in PIP of 3rd finger. able to lift UE from bed to abdomen but difficulty  replicating movements on command. hand over hand assist to bend elbow and bring hand to mouth.discomfort in digit extension. noted darker area on L 1st digit on dorsal aspect, likely from splint - discontinued splint orders and encouraged pt to rest with digits extended 7/22. Today 7/25- pt independently showing use of L hand to stretch R digits RUE Sensation: decreased light touch RUE Coordination: decreased fine motor;decreased gross motor LUE Deficits / Details: Using LUE, often times when cued to use R UE, involuntary movements at times LUE Coordination: decreased gross motor;decreased fine motor   Lower Extremity Assessment Lower Extremity Assessment: Defer to PT evaluation        Vision   Vision Assessment?: No apparent visual deficits   Perception     Praxis      Cognition Arousal/Alertness: Awake/alert Behavior During Therapy: WFL for tasks assessed/performed Overall Cognitive Status: Difficult to assess Area of Impairment: Attention, Safety/judgement, Awareness, Following commands, Problem solving, Memory                   Current Attention Level: Selective Memory: Decreased short-term memory Following Commands: Follows one step commands with increased time, Follows one step commands inconsistently Safety/Judgement: Decreased awareness of deficits, Decreased awareness of safety Awareness: Intellectual, Emergent Problem Solving: Slow processing, Requires verbal cues, Requires tactile cues General Comments: speech with continued improvements, able to understand at least 75% of pt speech/needs expression. follows directions inconsistently in regards to RUE though some improvements today, often moving LUE to perform action.        Exercises Exercises: Other exercises Other Exercises Other Exercises: R digit extension AAROM + pt using L hand to stretch    Shoulder Instructions       General Comments      Pertinent Vitals/ Pain       Pain Assessment Pain  Assessment: Faces Faces Pain Scale: Hurts little more Pain Location: R digits with extension; difficult to identify when discomfort expressed with HOB elevated Pain Descriptors / Indicators: Discomfort, Grimacing Pain Intervention(s): Monitored during session  Home Living                                          Prior Functioning/Environment              Frequency  Min 1X/week        Progress Toward Goals  OT Goals(current goals can now be found in the care plan section)  Progress towards OT goals: Progressing toward goals  Acute Rehab OT Goals OT Goal Formulation: With patient Time For Goal Achievement: 11/19/22 Potential to Achieve Goals: Fair ADL Goals Pt Will Perform Eating: with supervision;sitting;bed level;with adaptive utensils Pt Will Perform Grooming: with min guard assist;sitting Pt Will Perform Upper Body Bathing: with mod assist;sitting Pt Will Perform Lower Body Bathing: with mod assist;sitting/lateral leans Pt Will Perform Upper Body Dressing: with mod assist;sitting Pt Will Perform Lower Body Dressing: with max assist;sitting/lateral leans;sit to/from stand Pt Will Transfer to Toilet: squat pivot transfer;stand pivot transfer;bedside commode;with mod  assist Additional ADL Goal #1: Patient will tolerate R resting hand splint 4 hours on and 4 off with no skin integrity issues.  Plan Discharge plan remains appropriate    Co-evaluation                 AM-PAC OT "6 Clicks" Daily Activity     Outcome Measure   Help from another person eating meals?: A Lot Help from another person taking care of personal grooming?: A Lot Help from another person toileting, which includes using toliet, bedpan, or urinal?: Total Help from another person bathing (including washing, rinsing, drying)?: Total Help from another person to put on and taking off regular upper body clothing?: A Lot Help from another person to put on and taking off regular  lower body clothing?: Total 6 Click Score: 9    End of Session    OT Visit Diagnosis: Unsteadiness on feet (R26.81);Other abnormalities of gait and mobility (R26.89);Muscle weakness (generalized) (M62.81);Cognitive communication deficit (R41.841);Hemiplegia and hemiparesis;Low vision, both eyes (H54.2);Apraxia (R48.2);Other symptoms and signs involving cognitive function Symptoms and signs involving cognitive functions: Cerebral infarction Hemiplegia - Right/Left: Right Hemiplegia - dominant/non-dominant: Dominant Hemiplegia - caused by: Cerebral infarction   Activity Tolerance Patient tolerated treatment well   Patient Left in bed;with call bell/phone within reach;with bed alarm set   Nurse Communication Mobility status        Time: 1324-4010 OT Time Calculation (min): 34 min  Charges: OT General Charges $OT Visit: 1 Visit OT Treatments $Self Care/Home Management : 23-37 mins  Bradd Canary, OTR/L Acute Rehab Services Office: [phone]   Lorre Munroe 11/08/2022, 10:55 AM

## 2022-11-08 NOTE — Progress Notes (Signed)
Calorie Count Note  48-hour calorie count ordered. Reviewed tickets collected over the last 48 hours. Only have documentation from 4 out of 6 meals so unable to definitely say how much patient taking in, but results are below. Pt does appears ot have consistent intake of about 100-200 kcal per meal but overall protien intake is very low. Currently, TF regimen is meeting 80% of kcal and ~75% of estimated protein needs after the total volume was reduced on 7/23. Feel that this is an appropriate regimen at this time. As pt begins to eat more and continues to rehab, would recommend RD at next venue (SNF) continue to work on adjust TF regimen based on pt's consistent intake.  Diet: DYS 3, thin liquids  Estimated Nutritional Needs:  Kcal:  2200-2400 kcal/d Protein:  105-125 g/d Fluid:  >/= 2 L  7/23 Lunch: 112 kcal, 6g protein 7/24 Breakfast: 186 kcal, 6g protein 7/24 Lunch: 0% consumed 7/24 Breakfast: 263 kcal, 2 g protein  Total intake: 561 kcal (25% of minimum estimated needs)  14 protein (13% of minimum estimated needs)  NUTRITION DIAGNOSIS:  Inadequate oral intake related to inability to eat as evidenced by NPO status. Ongoing.    GOAL:  Patient will meet greater than or equal to 90% of their needs Met with TF at goal, diet advanced  INTERVENTION:  Continue current diet as ordered per SLP Continue the following: Jevity 1.5 4x/d ( total/d or 5 cartons) Free water 30mL free water before and after each bolus This provides 1800kcal, 77g protein, and of free water ( total free water) Banatrol BID   Greig Castilla, RD, LDN Clinical Dietitian RD pager # available in [facility]  After hours/weekend pager # available in [facility]

## 2022-11-08 NOTE — Plan of Care (Signed)
  Problem: Coping: Goal: Will identify appropriate support needs Outcome: Progressing

## 2022-11-09 DIAGNOSIS — I63413 Cerebral infarction due to embolism of bilateral middle cerebral arteries: Secondary | ICD-10-CM | POA: Diagnosis not present

## 2022-11-09 LAB — GLUCOSE, CAPILLARY
Glucose-Capillary: 98 mg/dL (ref 70–99)
Glucose-Capillary: 93 mg/dL (ref 70–99)
Glucose-Capillary: 83 mg/dL (ref 70–99)
Glucose-Capillary: 118 mg/dL — ABNORMAL HIGH (ref 70–99)

## 2022-11-09 NOTE — TOC Progression Note (Signed)
Transition of Care [facility]) - Progression Note    Patient Details  Name: [name] MRN: [id number] Date of Birth: 05-13-1960  [facility]) CM/SW Contact  Dellie Burns [place], [place] Phone Number: 11/09/2022, 11:31 AM  Clinical Narrative:  per Marcelino Duster at [facility], [name] auth is still pending at this time. Confirmed [name] is able to admit pt over weekend if auth received then. Will provide updates as available.   Dellie Burns, MSW, LCSW [phone] (coverage)       Expected Discharge Plan: Skilled Nursing Facility Barriers to Discharge: Continued Medical Work up, English as a second language teacher, SNF Pending bed offer  Expected Discharge Plan and Services In-house Referral: Clinical Social Work     Living arrangements for the past 2 months: Apartment                                       Social Determinants of Health (SDOH) Interventions SDOH Screenings   Food Insecurity: Patient Unable To Answer (10/11/2022)  Housing: Patient Unable To Answer (10/11/2022)  Transportation Needs: Patient Unable To Answer (10/11/2022)  Utilities: Patient Unable To Answer (10/11/2022)  Depression (PHQ2-9): Low Risk  (05/08/2022)  Social Connections: Unknown (08/28/2021)   Received from [facility]  Tobacco Use: High Risk (10/29/2022)    Readmission Risk Interventions     No data to display

## 2022-11-09 NOTE — Progress Notes (Signed)
Speech Language Pathology Treatment: Cognitive-Linquistic  Patient Details Name: [name] MRN: [id number] DOB: 1960-07-20 Today's Date: 11/09/2022 Time: 1135-1200 SLP Time Calculation (min) (ACUTE ONLY): 25 min  Assessment / Plan / Recommendation Clinical Impression  Pt continues demonstrating progress towards use of aug com and functional communication. Pt continues to be capable of producing social responses intermittently. SLP presented the keyboard feature on the aug com device and he effectively communicated today that his reading has been impacted since having the stroke. He conveyed that he requires pictures in order to comprehend written information. Pt now with improving receptive abilities and able to follow commands to point to specific functions on aug com device. Pt also able to use pictures on aug com device to communicate what he ate for breakfast this morning with Min cueing to point with his L hand. He also shows improvement pointing to choices on aug com device given indirect questions and Min verbal cueing. Pt shows great potential for use of an aug com device in ongoing ST intervention. May be a great candidate for a personal aug com device. Will continue to f/u during acute stay for cognition and language.    HPI HPI: Pt is a 62 y/o male who presented 6/19 with L weakness and numbness. Pt received TNK then developed new symptoms of aphasia and R weakness. He underwent mechanical thrombectomy L M3 segment MCA 6/19. Pt noted to have small hemorrhage and follow up CT showed progression of hemorrhage 3cm then 7cm and small acute R cerebellar infarct. PEG placed 7/10. PMH: CAD s/p PCI w/stent and 1v CABG, multiple CGAs, systolic HF s/p ICD, HLD, HTN.      SLP Plan  Continue with current plan of care      Recommendations for follow up therapy are one component of a multi-disciplinary discharge planning process, led by the attending physician.  Recommendations may be updated  based on patient status, additional functional criteria and insurance authorization.    Recommendations                     Oral care BID   Frequent or constant Supervision/Assistance Aphasia (R47.01);Cognitive communication deficit (R41.841);Dysarthria and anarthria (R47.1)     Continue with current plan of care     [name], M.A., CF-SLP Speech Language Pathology, Acute Rehabilitation Services  Secure Chat preferred [phone]   11/09/2022, 12:05 PM

## 2022-11-09 NOTE — Plan of Care (Signed)
  Problem: Education: Goal: Knowledge of disease or condition will improve Outcome: Progressing Goal: Knowledge of secondary prevention will improve (MUST DOCUMENT ALL) Outcome: Progressing Goal: Knowledge of patient specific risk factors will improve (Mark N/A or DELETE if not current risk factor) Outcome: Progressing   Problem: Ischemic Stroke/TIA Tissue Perfusion: Goal: Complications of ischemic stroke/TIA will be minimized Outcome: Progressing   Problem: Coping: Goal: Will verbalize positive feelings about self Outcome: Progressing Goal: Will identify appropriate support needs Outcome: Progressing   Problem: Health Behavior/Discharge Planning: Goal: Ability to manage health-related needs will improve Outcome: Progressing Goal: Goals will be collaboratively established with patient/family Outcome: Progressing   Problem: Self-Care: Goal: Ability to participate in self-care as condition permits will improve Outcome: Progressing Goal: Verbalization of feelings and concerns over difficulty with self-care will improve Outcome: Progressing Goal: Ability to communicate needs accurately will improve Outcome: Progressing   Problem: Nutrition: Goal: Risk of aspiration will decrease Outcome: Progressing Goal: Dietary intake will improve Outcome: Progressing   Problem: Education: Goal: Knowledge of General Education information will improve Description: Including pain rating scale, medication(s)/side effects and non-pharmacologic comfort measures Outcome: Progressing   Problem: Health Behavior/Discharge Planning: Goal: Ability to manage health-related needs will improve Outcome: Progressing   Problem: Clinical Measurements: Goal: Ability to maintain clinical measurements within normal limits will improve Outcome: Progressing Goal: Will remain free from infection Outcome: Progressing Goal: Diagnostic test results will improve Outcome: Progressing Goal: Respiratory  complications will improve Outcome: Progressing Goal: Cardiovascular complication will be avoided Outcome: Progressing   Problem: Activity: Goal: Risk for activity intolerance will decrease Outcome: Progressing   Problem: Nutrition: Goal: Adequate nutrition will be maintained Outcome: Progressing   Problem: Coping: Goal: Level of anxiety will decrease Outcome: Progressing   Problem: Elimination: Goal: Will not experience complications related to bowel motility Outcome: Progressing Goal: Will not experience complications related to urinary retention Outcome: Progressing   Problem: Pain Managment: Goal: General experience of comfort will improve Outcome: Progressing   Problem: Safety: Goal: Ability to remain free from injury will improve Outcome: Progressing   Problem: Skin Integrity: Goal: Risk for impaired skin integrity will decrease Outcome: Progressing   Problem: Education: Goal: Understanding of CV disease, CV risk reduction, and recovery process will improve Outcome: Progressing Goal: Individualized Educational Video(s) Outcome: Progressing   Problem: Activity: Goal: Ability to return to baseline activity level will improve Outcome: Progressing   Problem: Cardiovascular: Goal: Ability to achieve and maintain adequate cardiovascular perfusion will improve Outcome: Progressing Goal: Vascular access site(s) Level 0-1 will be maintained Outcome: Progressing   Problem: Health Behavior/Discharge Planning: Goal: Ability to safely manage health-related needs after discharge will improve Outcome: Progressing   

## 2022-11-09 NOTE — Progress Notes (Addendum)
[facility] PROGRESS NOTE  [name] (DOB: [birth year]) UXL:[id number] PCP: [name], MD  Brief Narrative: Mr. Decelle is a 62 y.o. 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  Remains in the hospital.  Hospital course complicated by hemorrhagic conversion of the stroke. Improving now, waiting for SNF   6/18: Admitted for stroke, received TNK; developed right sided deficits overnight after TNK, underwent NIR thrombectomy 6/19: Post thrombectomy CT showed left frontal IPH and SAH 6/20: Heparin started cautiously 6/22: CT showed progression of hematomas, heparin stopped 6/23: Repeat echo shows dehiscence of prior pseudoaneurysm patch with adherent LV thrombus; Cardiology consulted 6/26: ID consulted for MSSE bacteremia 6/28: EP consulted, recommended not to take out ICD; CT stabilized, heparin restarted 7/2: Transferred OOU 7/10: Placed PEG tube 7/11: Transitioned to basal bolus feedings and switched to Eliquis 7/13: Increased HA, repeat CT shows progression of ICHs; Eliquis held 7/15: TEE negative, antibiotics stopped 7/17: Eliquis restarted   Significant studies: 6/18 CTA head and neck: no LVO 6/18 repeat CTA head and neck: new distal left MCA M2 occlusion 6/19 Echo: poor quality images, nondiagnostic 6/19 TCD: positive study, indicating R>L shunt 6/19 LE doppler: no DVT 6/19 CT head: left frontal parietal IPH and adjacent [facility] 6/22 CT head: size now 8x4x4cm, with new 2cm component 6/23 complete echo: now shows recurrence of pseudoaneurysm, dehiscence of patch with adherent thrombus 6/25 coronary CTA: confirms above 6/28 CT head: stable large left frontoparietal hematoma 7/13: CT head shows worsening ICH 7/15 TEE: shows pseudoaneurysm with adherent clot, no valvular vegetations, no signs of  vegetation on ICD leads 7/16: CT head shows regression of ICH noted 7/13 7/19: CT head shows no change in ICH     Significant microbiology data: 6/18 MRSA nares: Negative 6/25 Blood cultures: MSSE in 2/2 6/27 blood cultures: NG in 2/2     Procedures: 6/18: TNK at 1246 6/19: Thrombectomy by Dr. Tommie Sams 6/19: Cortrak placed 7/10: PEG placed 7/15: TEE 7/26: Seeking SNF placement. Medically stable to discharge.  Subjective: No complaints  Objective: BP 125/85 (BP Location: Left Arm)   Pulse 79   Temp 97.7 F (36.5 C) (Oral)   Resp 20   Ht 6\' 1"  (1.854 m)   Wt 99.9 kg   SpO2 97%   BMI 29.06 kg/m   Gen: No distress Pulm: Clear, nonlabored CV: RRR GI: Soft, NT, ND, +BS. PEG c/d/i Neuro: Alert and oriented, chronic dysarthria and R hemiparesis. No new focal deficits.  Assessment & Plan: Acute stroke with intraparenchymal hematoma: Patient admitted and underwent TNK.  Later same day had second stroke with left M2 occlusion and underwent thrombectomy.    Source of both likely cardioembolic from pseudoaneurysm.  Unfortunately, post-TNK/thrombectomy course complicated by ICH and SAH.   Heparin was started due to pseudoaneurysm but ICH size increased so this was held for 1 week.  Serial CTs eventually showed stabilization, transitioned to heparin again, and finally Eliquis. Continue Lipitor.    Bacteremia due to Gram-positive bacteria Treated with 2 weeks IV cefazolin.  ID consulted.  TEE showed no vegetations and no complications with ICD leads.  Completed antibiotic therapy.   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  Stable by echo as recently as Jan of this  year.   After admission, Echo showed the patch dehisced and he had adherent thrombus.  Cardiology were consulted and recommended he would need long term anticoagulation and that ultimately, if his neurologic recovery allowed and he were felt to  be healthy enough, repeat pseudoaneurysm repair would be indicated (a very high risk operation)    Acute on chronic systolic CHF (congestive heart failure) (HCC) Treated and now appears euvolemic - Continue Entresto, BB, spiro, Lasix   Essential hypertension - Continue Entresto, Coreg, Lasix, metoprolol, spironolactone   Dyslipidemia - Continue Lipitor   Dysphagia PEG placed 7/10.   - Continue bolus feeds - SS correction insulin with feeds -- Patient is tolerating dysphagia diet now, encourage oral intake.   Thrombocytopenia (HCC) Resolved.   Aspiration pneumonia (HCC) Treated with Rocephin to cefazolin, now resolevd.   ICD (implantable cardioverter-defibrillator) in place  Tyrone Nine, MD [facility] [email or web address] 11/09/2022, 12:32 PM

## 2022-11-10 DIAGNOSIS — I63413 Cerebral infarction due to embolism of bilateral middle cerebral arteries: Secondary | ICD-10-CM | POA: Diagnosis not present

## 2022-11-10 LAB — COMPREHENSIVE METABOLIC PANEL
ALT: 30 U/L (ref 0–44)
AST: 22 U/L (ref 15–41)
Albumin: 3.3 g/dL — ABNORMAL LOW (ref 3.5–5.0)
Alkaline Phosphatase: 121 U/L (ref 38–126)
Anion gap: 12 (ref 5–15)
BUN: 12 mg/dL (ref 8–23)
CO2: 24 mmol/L (ref 22–32)
Calcium: 9.4 mg/dL (ref 8.9–10.3)
Chloride: 98 mmol/L (ref 98–111)
Creatinine, Ser: 0.78 mg/dL (ref 0.61–1.24)
GFR, Estimated: >60 mL/min (ref >60)
Glucose, Bld: 126 mg/dL — ABNORMAL HIGH (ref 70–99)
Potassium: 4.3 mmol/L (ref 3.5–5.1)
Sodium: 134 mmol/L — ABNORMAL LOW (ref 135–145)
Total Bilirubin: 0.4 mg/dL (ref 0.3–1.2)
Total Protein: 6.9 g/dL (ref 6.5–8.1)

## 2022-11-10 LAB — CBC
HCT: 36.4 % — ABNORMAL LOW (ref 39.0–52.0)
Hemoglobin: 12.3 g/dL — ABNORMAL LOW (ref 13.0–17.0)
MCH: 30.1 pg (ref 26.0–34.0)
MCHC: 33.8 g/dL (ref 30.0–36.0)
MCV: 89.2 fL (ref 80.0–100.0)
Platelets: 183 10*3/uL (ref 150–400)
RBC: 4.08 MIL/uL — ABNORMAL LOW (ref 4.22–5.81)
RDW: 13.5 % (ref 11.5–15.5)
WBC: 5.5 10*3/uL (ref 4.0–10.5)
nRBC: 0 % (ref 0.0–0.2)

## 2022-11-10 LAB — GLUCOSE, CAPILLARY
Glucose-Capillary: 193 mg/dL — ABNORMAL HIGH (ref 70–99)
Glucose-Capillary: 125 mg/dL — ABNORMAL HIGH (ref 70–99)
Glucose-Capillary: 140 mg/dL — ABNORMAL HIGH (ref 70–99)
Glucose-Capillary: 166 mg/dL — ABNORMAL HIGH (ref 70–99)

## 2022-11-10 NOTE — Plan of Care (Signed)
Problem: Education: Goal: Knowledge of disease or condition will improve Outcome: Progressing Goal: Knowledge of secondary prevention will improve (MUST DOCUMENT ALL) Outcome: Progressing Goal: Knowledge of patient specific risk factors will improve [name] N/A or DELETE if not current risk factor) Outcome: Progressing   Problem: Ischemic Stroke/TIA Tissue Perfusion: Goal: Complications of ischemic stroke/TIA will be minimized Outcome: Progressing   Problem: Coping: Goal: Will verbalize positive feelings about self Outcome: Progressing Goal: Will identify appropriate support needs Outcome: Progressing   Problem: Health Behavior/Discharge Planning: Goal: Ability to manage health-related needs will improve Outcome: Progressing Goal: Goals will be collaboratively established with patient/family Outcome: Progressing   Problem: Self-Care: Goal: Ability to participate in self-care as condition permits will improve Outcome: Progressing Goal: Verbalization of feelings and concerns over difficulty with self-care will improve Outcome: Progressing Goal: Ability to communicate needs accurately will improve Outcome: Progressing   Problem: Nutrition: Goal: Risk of aspiration will decrease Outcome: Progressing Goal: Dietary intake will improve Outcome: Progressing   Problem: Education: Goal: Knowledge of General Education information will improve Description: Including pain rating scale, medication(s)/side effects and non-pharmacologic comfort measures Outcome: Progressing   Problem: Health Behavior/Discharge Planning: Goal: Ability to manage health-related needs will improve Outcome: Progressing   Problem: Clinical Measurements: Goal: Ability to maintain clinical measurements within normal limits will improve Outcome: Progressing Goal: Will remain free from infection Outcome: Progressing Goal: Diagnostic test results will improve Outcome: Progressing Goal: Respiratory  complications will improve Outcome: Progressing Goal: Cardiovascular complication will be avoided Outcome: Progressing   Problem: Activity: Goal: Risk for activity intolerance will decrease Outcome: Progressing   Problem: Nutrition: Goal: Adequate nutrition will be maintained Outcome: Progressing   Problem: Coping: Goal: Level of anxiety will decrease Outcome: Progressing   Problem: Elimination: Goal: Will not experience complications related to bowel motility Outcome: Progressing Goal: Will not experience complications related to urinary retention Outcome: Progressing   Problem: Pain Managment: Goal: General experience of comfort will improve Outcome: Progressing   Problem: Safety: Goal: Ability to remain free from injury will improve Outcome: Progressing   Problem: Skin Integrity: Goal: Risk for impaired skin integrity will decrease Outcome: Progressing   Problem: Education: Goal: Understanding of CV disease, CV risk reduction, and recovery process will improve Outcome: Progressing Goal: Individualized Educational Video(s) Outcome: Progressing   Problem: Activity: Goal: Ability to return to baseline activity level will improve Outcome: Progressing   Problem: Cardiovascular: Goal: Ability to achieve and maintain adequate cardiovascular perfusion will improve Outcome: Progressing Goal: Vascular access site(s) Level 0-1 will be maintained Outcome: Progressing   Problem: Health Behavior/Discharge Planning: Goal: Ability to safely manage health-related needs after discharge will improve Outcome: Progressing   Problem: Education: Goal: Ability to describe self-care measures that may prevent or decrease complications (Diabetes Survival Skills Education) will improve Outcome: Progressing Goal: Individualized Educational Video(s) Outcome: Progressing   Problem: Coping: Goal: Ability to adjust to condition or change in health will improve Outcome: Progressing    Problem: Fluid Volume: Goal: Ability to maintain a balanced intake and output will improve Outcome: Progressing   Problem: Health Behavior/Discharge Planning: Goal: Ability to identify and utilize available resources and services will improve Outcome: Progressing Goal: Ability to manage health-related needs will improve Outcome: Progressing   Problem: Metabolic: Goal: Ability to maintain appropriate glucose levels will improve Outcome: Progressing   Problem: Nutritional: Goal: Maintenance of adequate nutrition will improve Outcome: Progressing Goal: Progress toward achieving an optimal weight will improve Outcome: Progressing   Problem: Skin Integrity: Goal: Risk for impaired skin  integrity will decrease Outcome: Progressing   Problem: Tissue Perfusion: Goal: Adequacy of tissue perfusion will improve Outcome: Progressing

## 2022-11-10 NOTE — Plan of Care (Signed)
Problem: Education: Goal: Knowledge of disease or condition will improve 11/10/2022 1838 by Asencion Gowda, RN Outcome: Progressing 11/10/2022 1724 by Asencion Gowda, RN Outcome: Progressing Goal: Knowledge of secondary prevention will improve (MUST DOCUMENT ALL) 11/10/2022 1838 by Asencion Gowda, RN Outcome: Progressing 11/10/2022 1724 by Asencion Gowda, RN Outcome: Progressing Goal: Knowledge of patient specific risk factors will improve [name] N/A or DELETE if not current risk factor) 11/10/2022 1838 by Asencion Gowda, RN Outcome: Progressing 11/10/2022 1724 by Asencion Gowda, RN Outcome: Progressing   Problem: Ischemic Stroke/TIA Tissue Perfusion: Goal: Complications of ischemic stroke/TIA will be minimized 11/10/2022 1838 by Asencion Gowda, RN Outcome: Progressing 11/10/2022 1724 by Asencion Gowda, RN Outcome: Progressing   Problem: Coping: Goal: Will verbalize positive feelings about self 11/10/2022 1838 by Asencion Gowda, RN Outcome: Progressing 11/10/2022 1724 by Asencion Gowda, RN Outcome: Progressing Goal: Will identify appropriate support needs 11/10/2022 1838 by Asencion Gowda, RN Outcome: Progressing 11/10/2022 1724 by Asencion Gowda, RN Outcome: Progressing   Problem: Health Behavior/Discharge Planning: Goal: Ability to manage health-related needs will improve 11/10/2022 1838 by Asencion Gowda, RN Outcome: Progressing 11/10/2022 1724 by Asencion Gowda, RN Outcome: Progressing Goal: Goals will be collaboratively established with patient/family 11/10/2022 1838 by Asencion Gowda, RN Outcome: Progressing 11/10/2022 1724 by Asencion Gowda, RN Outcome: Progressing   Problem: Self-Care: Goal: Ability to participate in self-care as condition permits will improve 11/10/2022 1838 by Asencion Gowda, RN Outcome: Progressing 11/10/2022 1724 by Asencion Gowda, RN Outcome: Progressing Goal:  Verbalization of feelings and concerns over difficulty with self-care will improve 11/10/2022 1838 by Asencion Gowda, RN Outcome: Progressing 11/10/2022 1724 by Asencion Gowda, RN Outcome: Progressing Goal: Ability to communicate needs accurately will improve 11/10/2022 1838 by Asencion Gowda, RN Outcome: Progressing 11/10/2022 1724 by Asencion Gowda, RN Outcome: Progressing   Problem: Nutrition: Goal: Risk of aspiration will decrease 11/10/2022 1838 by Asencion Gowda, RN Outcome: Progressing 11/10/2022 1724 by Asencion Gowda, RN Outcome: Progressing Goal: Dietary intake will improve 11/10/2022 1838 by Asencion Gowda, RN Outcome: Progressing 11/10/2022 1724 by Asencion Gowda, RN Outcome: Progressing   Problem: Education: Goal: Knowledge of General Education information will improve Description: Including pain rating scale, medication(s)/side effects and non-pharmacologic comfort measures 11/10/2022 1838 by Asencion Gowda, RN Outcome: Progressing 11/10/2022 1724 by Asencion Gowda, RN Outcome: Progressing   Problem: Health Behavior/Discharge Planning: Goal: Ability to manage health-related needs will improve 11/10/2022 1838 by Asencion Gowda, RN Outcome: Progressing 11/10/2022 1724 by Asencion Gowda, RN Outcome: Progressing   Problem: Clinical Measurements: Goal: Ability to maintain clinical measurements within normal limits will improve 11/10/2022 1838 by Asencion Gowda, RN Outcome: Progressing 11/10/2022 1724 by Asencion Gowda, RN Outcome: Progressing Goal: Will remain free from infection 11/10/2022 1838 by Asencion Gowda, RN Outcome: Progressing 11/10/2022 1724 by Asencion Gowda, RN Outcome: Progressing Goal: Diagnostic test results will improve 11/10/2022 1838 by Asencion Gowda, RN Outcome: Progressing 11/10/2022 1724 by Asencion Gowda, RN Outcome: Progressing Goal: Respiratory  complications will improve 11/10/2022 1838 by Asencion Gowda, RN Outcome: Progressing 11/10/2022 1724 by Asencion Gowda, RN Outcome: Progressing Goal: Cardiovascular complication will be avoided 11/10/2022 1838 by Asencion Gowda, RN Outcome: Progressing 11/10/2022 1724 by Asencion Gowda, RN Outcome: Progressing   Problem: Activity: Goal: Risk for activity intolerance will decrease 11/10/2022 1838 by Asencion Gowda, RN Outcome: Progressing  11/10/2022 1724 by Asencion Gowda, RN Outcome: Progressing   Problem: Nutrition: Goal: Adequate nutrition will be maintained 11/10/2022 1838 by Asencion Gowda, RN Outcome: Progressing 11/10/2022 1724 by Asencion Gowda, RN Outcome: Progressing   Problem: Coping: Goal: Level of anxiety will decrease 11/10/2022 1838 by Asencion Gowda, RN Outcome: Progressing 11/10/2022 1724 by Asencion Gowda, RN Outcome: Progressing   Problem: Elimination: Goal: Will not experience complications related to bowel motility 11/10/2022 1838 by Asencion Gowda, RN Outcome: Progressing 11/10/2022 1724 by Asencion Gowda, RN Outcome: Progressing Goal: Will not experience complications related to urinary retention 11/10/2022 1838 by Asencion Gowda, RN Outcome: Progressing 11/10/2022 1724 by Asencion Gowda, RN Outcome: Progressing   Problem: Pain Managment: Goal: General experience of comfort will improve 11/10/2022 1838 by Asencion Gowda, RN Outcome: Progressing 11/10/2022 1724 by Asencion Gowda, RN Outcome: Progressing   Problem: Safety: Goal: Ability to remain free from injury will improve 11/10/2022 1838 by Asencion Gowda, RN Outcome: Progressing 11/10/2022 1724 by Asencion Gowda, RN Outcome: Progressing   Problem: Skin Integrity: Goal: Risk for impaired skin integrity will decrease 11/10/2022 1838 by Asencion Gowda, RN Outcome: Progressing 11/10/2022 1724 by Asencion Gowda, RN Outcome: Progressing   Problem: Education: Goal: Understanding of CV disease, CV risk reduction, and recovery process will improve 11/10/2022 1838 by Asencion Gowda, RN Outcome: Progressing 11/10/2022 1724 by Asencion Gowda, RN Outcome: Progressing Goal: Individualized Educational Video(s) 11/10/2022 1838 by Asencion Gowda, RN Outcome: Progressing 11/10/2022 1724 by Asencion Gowda, RN Outcome: Progressing   Problem: Activity: Goal: Ability to return to baseline activity level will improve 11/10/2022 1838 by Asencion Gowda, RN Outcome: Progressing 11/10/2022 1724 by Asencion Gowda, RN Outcome: Progressing   Problem: Cardiovascular: Goal: Ability to achieve and maintain adequate cardiovascular perfusion will improve 11/10/2022 1838 by Asencion Gowda, RN Outcome: Progressing 11/10/2022 1724 by Asencion Gowda, RN Outcome: Progressing Goal: Vascular access site(s) Level 0-1 will be maintained 11/10/2022 1838 by Asencion Gowda, RN Outcome: Progressing 11/10/2022 1724 by Asencion Gowda, RN Outcome: Progressing   Problem: Health Behavior/Discharge Planning: Goal: Ability to safely manage health-related needs after discharge will improve 11/10/2022 1838 by Asencion Gowda, RN Outcome: Progressing 11/10/2022 1724 by Asencion Gowda, RN Outcome: Progressing   Problem: Education: Goal: Ability to describe self-care measures that may prevent or decrease complications (Diabetes Survival Skills Education) will improve 11/10/2022 1838 by Asencion Gowda, RN Outcome: Progressing 11/10/2022 1724 by Asencion Gowda, RN Outcome: Progressing Goal: Individualized Educational Video(s) 11/10/2022 1838 by Asencion Gowda, RN Outcome: Progressing 11/10/2022 1724 by Asencion Gowda, RN Outcome: Progressing   Problem: Coping: Goal: Ability to adjust to condition or change in health will improve 11/10/2022 1838 by  Asencion Gowda, RN Outcome: Progressing 11/10/2022 1724 by Asencion Gowda, RN Outcome: Progressing   Problem: Fluid Volume: Goal: Ability to maintain a balanced intake and output will improve 11/10/2022 1838 by Asencion Gowda, RN Outcome: Progressing 11/10/2022 1724 by Asencion Gowda, RN Outcome: Progressing   Problem: Health Behavior/Discharge Planning: Goal: Ability to identify and utilize available resources and services will improve 11/10/2022 1838 by Asencion Gowda, RN Outcome: Progressing 11/10/2022 1724 by Asencion Gowda, RN Outcome: Progressing Goal: Ability to manage health-related needs will improve 11/10/2022 1838 by Asencion Gowda, RN Outcome: Progressing 11/10/2022 1724 by Asencion Gowda, RN Outcome: Progressing   Problem: Metabolic: Goal: Ability to  maintain appropriate glucose levels will improve 11/10/2022 1838 by Asencion Gowda, RN Outcome: Progressing 11/10/2022 1724 by Asencion Gowda, RN Outcome: Progressing   Problem: Nutritional: Goal: Maintenance of adequate nutrition will improve 11/10/2022 1838 by Asencion Gowda, RN Outcome: Progressing 11/10/2022 1724 by Asencion Gowda, RN Outcome: Progressing Goal: Progress toward achieving an optimal weight will improve 11/10/2022 1838 by Asencion Gowda, RN Outcome: Progressing 11/10/2022 1724 by Asencion Gowda, RN Outcome: Progressing   Problem: Skin Integrity: Goal: Risk for impaired skin integrity will decrease 11/10/2022 1838 by Asencion Gowda, RN Outcome: Progressing 11/10/2022 1724 by Asencion Gowda, RN Outcome: Progressing   Problem: Tissue Perfusion: Goal: Adequacy of tissue perfusion will improve 11/10/2022 1838 by Asencion Gowda, RN Outcome: Progressing 11/10/2022 1724 by Asencion Gowda, RN Outcome: Progressing

## 2022-11-10 NOTE — Progress Notes (Signed)
[facility] PROGRESS NOTE  [name] (DOB: 1960/04/21) [id number]:[id number] PCP: [name], MD  Brief Narrative: [name] is a 62 y.o. 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  Remains in the hospital.  Hospital course complicated by hemorrhagic conversion of the stroke. Improving now, waiting for SNF   6/18: Admitted for stroke, received TNK; developed right sided deficits overnight after TNK, underwent NIR thrombectomy 6/19: Post thrombectomy CT showed left frontal IPH and SAH 6/20: Heparin started cautiously 6/22: CT showed progression of hematomas, heparin stopped 6/23: Repeat echo shows dehiscence of prior pseudoaneurysm patch with adherent LV thrombus; Cardiology consulted 6/26: ID consulted for MSSE bacteremia 6/28: EP consulted, recommended not to take out ICD; CT stabilized, heparin restarted 7/2: Transferred OOU 7/10: Placed PEG tube 7/11: Transitioned to basal bolus feedings and switched to Eliquis 7/13: Increased HA, repeat CT shows progression of ICHs; Eliquis held 7/15: TEE negative, antibiotics stopped 7/17: Eliquis restarted   Significant studies: 6/18 CTA head and neck: no LVO 6/18 repeat CTA head and neck: new distal left MCA M2 occlusion 6/19 Echo: poor quality images, nondiagnostic 6/19 TCD: positive study, indicating R>L shunt 6/19 LE doppler: no DVT 6/19 CT head: left frontal parietal IPH and adjacent [facility] 6/22 CT head: size now 8x4x4cm, with new 2cm component 6/23 complete echo: now shows recurrence of pseudoaneurysm, dehiscence of patch with adherent thrombus 6/25 coronary CTA: confirms above 6/28 CT head: stable large left frontoparietal hematoma 7/13: CT head shows worsening ICH 7/15 TEE: shows pseudoaneurysm with adherent clot, no valvular vegetations, no signs of  vegetation on ICD leads 7/16: CT head shows regression of ICH noted 7/13 7/19: CT head shows no change in ICH   Procedures: 6/18: TNK at 1246 6/19: Thrombectomy by Dr. Tommie Sams 6/19: Cortrak placed 7/10: PEG placed 7/15: TEE 7/26: Seeking SNF placement. Medically stable to discharge.  Subjective: Again no complaints. No bleeding, no new numbness, weakness. Able to eat without choking.   Objective: BP 123/81 (BP Location: Left Arm)   Pulse 88   Temp 98.2 F (36.8 C) (Oral)   Resp 19   Ht 6\' 1"  (1.854 m)   Wt 99.7 kg   SpO2 98%   BMI 29.00 kg/m   Gen: No distress, in good spirits Pulm: Clear, nonlabored  CV: RRR, no edema GI: Soft, NT, ND, +BS. PEG c/d/i Neuro: Alert and oriented. R hemiparesis stable without new focal deficits.   Assessment & Plan: Acute stroke with intraparenchymal hematoma: Patient admitted and underwent TNK.  Later same day had second stroke with left M2 occlusion and underwent thrombectomy. Source of both likely cardioembolic from pseudoaneurysm. Unfortunately, post-TNK/thrombectomy course complicated by ICH and SAH.   - Heparin was started due to pseudoaneurysm but ICH size increased so this was held for 1 week.  Serial CTs eventually showed stabilization, transitioned to heparin again, and finally eliquis. Continue Lipitor.    S. epidermidis bacteremia: Completed 2 weeks IV ancef per ID. No valvular or ICD lead vegetations by TEE.     Pseudoaneurysm of left ventricle: Dx 2014 s/p patch repair of rupture w/pericardial tamponade and CABG x1 (WVG > OM1) Oct 2014 by Dr. Dorris Fetch. Stable in Jan 2024 by echo, but patch noted to be dehisced with adherent thrombus on echo this admission. No signs or  symptoms of tamponade at this time.   - Continue long term anticoagulation (eliquis) per cardiology. We've discontinued DAPT in light of full dose anticoagulation. - Follow up with cardiothoracic surgery for consideration of repeat repair if he is  surgical candidate.     Acute on chronic HFrEF, HTN: Now euvolemic after Tx.  - Continue monitoring volume status.  Sherryll Burger had been held due to soft BP. Will recheck metabolic profile and if K, Cr stable, restart entresto - Continue low doses of coreg, lasix 20mg  daily, spironolactone.  - Consider restart SGLT2i as well    Dyslipidemia - Continue lipitor   Dysphagia: PEG placed 7/10.   - Continue bolus feeds thru PEG - Monitor oral intake (calorie count in progress) with dysphagia 3 diet. Consider DC PEG tube if patient can meet hydration/nutrition requirements.  - No labs drawn for the past week. Will update today.   Thrombocytopenia: Improved - Recheck for stability today.   Aspiration pneumonia: Tx w/IV antibiotics, resolved.    ICD in place:  Adjustment reaction: - Was started on fluoxetine 10mg .  Tyrone Nine, MD [facility] [email or web address] 11/10/2022, 10:43 AM

## 2022-11-10 NOTE — TOC Progression Note (Signed)
Transition of Care [facility]) - Progression Note    Patient Details  Name: [name] MRN: [id number] Date of Birth: [birth year]  Transition of [facility] CM/SW Contact  Dellie Burns [place], [place] Phone Number: 11/10/2022, 9:40 AM  Clinical Narrative:  Ethlyn Gallery remains pending at this time for [facility]. Messaged Marcelino Duster in [place] admissions and requested she update if auth received this weekend. Pt's [employer] not managed by [facility] started [place].   Dellie Burns, MSW, LCSW [phone] (coverage)       Expected Discharge Plan: Skilled Nursing Facility Barriers to Discharge: Continued Medical Work up, English as a second language teacher, SNF Pending bed offer  Expected Discharge Plan and Services In-house Referral: Clinical Social Work     Living arrangements for the past 2 months: Apartment Expected Discharge Date: 11/10/22                                     Social Determinants of Health (SDOH) Interventions SDOH Screenings   Food Insecurity: Patient Unable To Answer (10/11/2022)  Housing: Patient Unable To Answer (10/11/2022)  Transportation Needs: Patient Unable To Answer (10/11/2022)  Utilities: Patient Unable To Answer (10/11/2022)  Depression (PHQ2-9): Low Risk  (05/08/2022)  Social Connections: Unknown (08/28/2021)   Received from [facility]  Tobacco Use: High Risk (10/29/2022)    Readmission Risk Interventions     No data to display

## 2022-11-11 DIAGNOSIS — I63413 Cerebral infarction due to embolism of bilateral middle cerebral arteries: Secondary | ICD-10-CM | POA: Diagnosis not present

## 2022-11-11 LAB — GLUCOSE, CAPILLARY
Glucose-Capillary: 94 mg/dL (ref 70–99)
Glucose-Capillary: 145 mg/dL — ABNORMAL HIGH (ref 70–99)
Glucose-Capillary: 120 mg/dL — ABNORMAL HIGH (ref 70–99)
Glucose-Capillary: 115 mg/dL — ABNORMAL HIGH (ref 70–99)

## 2022-11-11 MED ORDER — SACUBITRIL-VALSARTAN 24-26 MG PO TABS
1.0000 | ORAL_TABLET | Freq: Two times a day (BID) | ORAL | Status: DC
  Administered 2022-11-11 – 2022-11-16 (×11): 1 via ORAL
  Filled 2022-11-11 (×12): qty 1

## 2022-11-11 MED ORDER — IBUPROFEN 200 MG PO TABS
400.0000 mg | ORAL_TABLET | Freq: Four times a day (QID) | ORAL | Status: DC | PRN
  Administered 2022-11-12 – 2022-11-14 (×3): 400 mg via ORAL
  Filled 2022-11-11 (×3): qty 2

## 2022-11-11 NOTE — Progress Notes (Signed)
[facility] PROGRESS NOTE  [name] (DOB: 1960/09/28) ZOX:[id number] PCP: Robert Matar, MD  Brief Narrative: [name] is a 62 y.o. 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  Remains in the hospital.  Hospital course complicated by hemorrhagic conversion of the stroke. Improving now, waiting for SNF   6/18: Admitted for stroke, received TNK; developed right sided deficits overnight after TNK, underwent NIR thrombectomy 6/19: Post thrombectomy CT showed left frontal IPH and SAH 6/20: Heparin started cautiously 6/22: CT showed progression of hematomas, heparin stopped 6/23: Repeat echo shows dehiscence of prior pseudoaneurysm patch with adherent LV thrombus; Cardiology consulted 6/26: ID consulted for MSSE bacteremia 6/28: EP consulted, recommended not to take out ICD; CT stabilized, heparin restarted 7/2: Transferred OOU 7/10: Placed PEG tube 7/11: Transitioned to basal bolus feedings and switched to Eliquis 7/13: Increased HA, repeat CT shows progression of ICHs; Eliquis held 7/15: TEE negative, antibiotics stopped 7/17: Eliquis restarted   Significant studies: 6/18 CTA head and neck: no LVO 6/18 repeat CTA head and neck: new distal left MCA M2 occlusion 6/19 Echo: poor quality images, nondiagnostic 6/19 TCD: positive study, indicating R>L shunt 6/19 LE doppler: no DVT 6/19 CT head: left frontal parietal IPH and adjacent [facility] 6/22 CT head: size now 8x4x4cm, with new 2cm component 6/23 complete echo: now shows recurrence of pseudoaneurysm, dehiscence of patch with adherent thrombus 6/25 coronary CTA: confirms above 6/28 CT head: stable large left frontoparietal hematoma 7/13: CT head shows worsening ICH 7/15 TEE: shows pseudoaneurysm with adherent clot, no valvular vegetations, no signs of  vegetation on ICD leads 7/16: CT head shows regression of ICH noted 7/13 7/19: CT head shows no change in ICH   Procedures: 6/18: TNK at 1246 6/19: Thrombectomy by Dr. Tommie Sams 6/19: Cortrak placed 7/10: PEG placed 7/15: TEE 7/26: Seeking SNF placement. Medically stable to discharge.  Subjective: Frustrated with delays by insurance authorization. No complaints. [name] taking po very well in the past several days, doesn't think he needs tube feeds regularly.  Objective: BP 104/75   Pulse 79   Temp 98.3 F (36.8 C) (Oral)   Resp 15   Ht 6\' 1"  (1.854 m)   Wt 99.7 kg   SpO2 100%   BMI 29.00 kg/m   Gen: No distress Pulm: Clear, nonlabored  CV: RRR, no MRG or edema GI: Soft, NT, ND, +BS. PEG site under abd binder, c/d/i Neuro: Alert and oriented, stable deficits, dysarthria. No new focal deficits. Ext: Warm, no deformities Skin: No new rashes, lesions or ulcers on visualized skin   Assessment & Plan: Acute stroke with intraparenchymal hematoma: Patient admitted and underwent TNK.  Later same day had second stroke with left M2 occlusion and underwent thrombectomy. Source of both likely cardioembolic from pseudoaneurysm. Unfortunately, post-TNK/thrombectomy course complicated by ICH and SAH.   - Heparin was started due to pseudoaneurysm but ICH size increased so this was held for 1 week.  Serial CTs eventually showed stabilization, transitioned to heparin again, and finally eliquis. Continue Lipitor.    S. epidermidis bacteremia: Completed 2 weeks IV ancef per ID. No valvular or ICD lead vegetations by TEE.     Pseudoaneurysm of left ventricle: Dx 2014 s/p patch repair of rupture w/pericardial tamponade and CABG x1 (WVG > OM1) Oct 2014 by Dr.  Hendrickson. Stable in Jan 2024 by echo, but patch noted to be dehisced with adherent thrombus on echo this admission. No signs or symptoms of tamponade at this time.   - Continue long term anticoagulation (eliquis) per cardiology.  We've discontinued DAPT in light of full dose anticoagulation. - Follow up with cardiothoracic surgery for consideration of repeat repair if he is surgical candidate.     Acute on chronic HFrEF, HTN: Now euvolemic after Tx.  - Continue monitoring volume status.  Sherryll Burger had been held due to soft BP. BP up a bit, will restart entresto at lower dose. Will recheck metabolic profile and if K, Cr stable. - Continue low doses of coreg, lasix 20mg  daily, spironolactone.  - Consider restart SGLT2i as well    Dyslipidemia - Continue lipitor   Dysphagia: PEG placed 7/10.   - Oral intake has been sufficient. Hold bolus tube feeds if pt takes 75% of meal.    Thrombocytopenia: Improved - Recheck for stability today.   Aspiration pneumonia: Tx w/IV antibiotics, resolved.    ICD in place:  Adjustment reaction: - Was started on fluoxetine 10mg .  Tyrone Nine, MD [facility] [email or web address] 11/11/2022, 10:24 AM

## 2022-11-11 NOTE — TOC Progression Note (Signed)
Transition of Care [facility]) - Progression Note    Patient Details  Name: PHUOC PELLICANO MRN: [id number] Date of Birth: Mar 30, 1961  Transition of Care [facility]) CM/SW Contact  Dellie Burns [place], [place] Phone Number: 11/11/2022, 9:11 AM  Clinical Narrative:  per Marcelino Duster in [facility] admissions, Francine Graven auth remains pending at this time. Will provide updates as available.   Dellie Burns, MSW, LCSW [phone] (coverage)       Expected Discharge Plan: Skilled Nursing Facility Barriers to Discharge: Continued Medical Work up, English as a second language teacher, SNF Pending bed offer  Expected Discharge Plan and Services In-house Referral: Clinical Social Work     Living arrangements for the past 2 months: Apartment Expected Discharge Date: 11/10/22                                     Social Determinants of Health (SDOH) Interventions SDOH Screenings   Food Insecurity: Patient Unable To Answer (10/11/2022)  Housing: Patient Unable To Answer (10/11/2022)  Transportation Needs: Patient Unable To Answer (10/11/2022)  Utilities: Patient Unable To Answer (10/11/2022)  Depression (PHQ2-9): Low Risk  (05/08/2022)  Social Connections: Unknown (08/28/2021)   Received from [facility]  Tobacco Use: High Risk (10/29/2022)    Readmission Risk Interventions     No data to display

## 2022-11-11 NOTE — Plan of Care (Signed)
Problem: Education: Goal: Knowledge of disease or condition will improve Outcome: Progressing Goal: Knowledge of secondary prevention will improve (MUST DOCUMENT ALL) Outcome: Progressing Goal: Knowledge of patient specific risk factors will improve [name] N/A or DELETE if not current risk factor) Outcome: Progressing   Problem: Ischemic Stroke/TIA Tissue Perfusion: Goal: Complications of ischemic stroke/TIA will be minimized Outcome: Progressing   Problem: Coping: Goal: Will verbalize positive feelings about self Outcome: Progressing Goal: Will identify appropriate support needs Outcome: Progressing   Problem: Health Behavior/Discharge Planning: Goal: Ability to manage health-related needs will improve Outcome: Progressing Goal: Goals will be collaboratively established with patient/family Outcome: Progressing   Problem: Self-Care: Goal: Ability to participate in self-care as condition permits will improve Outcome: Progressing Goal: Verbalization of feelings and concerns over difficulty with self-care will improve Outcome: Progressing Goal: Ability to communicate needs accurately will improve Outcome: Progressing   Problem: Nutrition: Goal: Risk of aspiration will decrease Outcome: Progressing Goal: Dietary intake will improve Outcome: Progressing   Problem: Education: Goal: Knowledge of General Education information will improve Description: Including pain rating scale, medication(s)/side effects and non-pharmacologic comfort measures Outcome: Progressing   Problem: Health Behavior/Discharge Planning: Goal: Ability to manage health-related needs will improve Outcome: Progressing   Problem: Clinical Measurements: Goal: Ability to maintain clinical measurements within normal limits will improve Outcome: Progressing Goal: Will remain free from infection Outcome: Progressing Goal: Diagnostic test results will improve Outcome: Progressing Goal: Respiratory  complications will improve Outcome: Progressing Goal: Cardiovascular complication will be avoided Outcome: Progressing   Problem: Activity: Goal: Risk for activity intolerance will decrease Outcome: Progressing   Problem: Nutrition: Goal: Adequate nutrition will be maintained Outcome: Progressing   Problem: Coping: Goal: Level of anxiety will decrease Outcome: Progressing   Problem: Elimination: Goal: Will not experience complications related to bowel motility Outcome: Progressing Goal: Will not experience complications related to urinary retention Outcome: Progressing   Problem: Pain Managment: Goal: General experience of comfort will improve Outcome: Progressing   Problem: Safety: Goal: Ability to remain free from injury will improve Outcome: Progressing   Problem: Skin Integrity: Goal: Risk for impaired skin integrity will decrease Outcome: Progressing   Problem: Education: Goal: Understanding of CV disease, CV risk reduction, and recovery process will improve Outcome: Progressing Goal: Individualized Educational Video(s) Outcome: Progressing   Problem: Activity: Goal: Ability to return to baseline activity level will improve Outcome: Progressing   Problem: Cardiovascular: Goal: Ability to achieve and maintain adequate cardiovascular perfusion will improve Outcome: Progressing Goal: Vascular access site(s) Level 0-1 will be maintained Outcome: Progressing   Problem: Health Behavior/Discharge Planning: Goal: Ability to safely manage health-related needs after discharge will improve Outcome: Progressing   Problem: Education: Goal: Ability to describe self-care measures that may prevent or decrease complications (Diabetes Survival Skills Education) will improve Outcome: Progressing Goal: Individualized Educational Video(s) Outcome: Progressing   Problem: Coping: Goal: Ability to adjust to condition or change in health will improve Outcome: Progressing    Problem: Fluid Volume: Goal: Ability to maintain a balanced intake and output will improve Outcome: Progressing   Problem: Health Behavior/Discharge Planning: Goal: Ability to identify and utilize available resources and services will improve Outcome: Progressing Goal: Ability to manage health-related needs will improve Outcome: Progressing   Problem: Metabolic: Goal: Ability to maintain appropriate glucose levels will improve Outcome: Progressing   Problem: Nutritional: Goal: Maintenance of adequate nutrition will improve Outcome: Progressing Goal: Progress toward achieving an optimal weight will improve Outcome: Progressing   Problem: Skin Integrity: Goal: Risk for impaired skin  integrity will decrease Outcome: Progressing   Problem: Tissue Perfusion: Goal: Adequacy of tissue perfusion will improve Outcome: Progressing

## 2022-11-12 DIAGNOSIS — I63413 Cerebral infarction due to embolism of bilateral middle cerebral arteries: Secondary | ICD-10-CM | POA: Diagnosis not present

## 2022-11-12 DIAGNOSIS — I5023 Acute on chronic systolic (congestive) heart failure: Secondary | ICD-10-CM | POA: Diagnosis not present

## 2022-11-12 DIAGNOSIS — I253 Aneurysm of heart: Secondary | ICD-10-CM | POA: Diagnosis not present

## 2022-11-12 DIAGNOSIS — R7881 Bacteremia: Secondary | ICD-10-CM | POA: Diagnosis not present

## 2022-11-12 LAB — GLUCOSE, CAPILLARY
Glucose-Capillary: 89 mg/dL (ref 70–99)
Glucose-Capillary: 131 mg/dL — ABNORMAL HIGH (ref 70–99)
Glucose-Capillary: 126 mg/dL — ABNORMAL HIGH (ref 70–99)
Glucose-Capillary: 102 mg/dL — ABNORMAL HIGH (ref 70–99)

## 2022-11-12 NOTE — Plan of Care (Signed)
Problem: Education: Goal: Knowledge of disease or condition will improve Outcome: Progressing Goal: Knowledge of secondary prevention will improve (MUST DOCUMENT ALL) Outcome: Progressing Goal: Knowledge of patient specific risk factors will improve [name] N/A or DELETE if not current risk factor) Outcome: Progressing   Problem: Ischemic Stroke/TIA Tissue Perfusion: Goal: Complications of ischemic stroke/TIA will be minimized Outcome: Progressing   Problem: Coping: Goal: Will verbalize positive feelings about self Outcome: Progressing Goal: Will identify appropriate support needs Outcome: Progressing   Problem: Health Behavior/Discharge Planning: Goal: Ability to manage health-related needs will improve Outcome: Progressing Goal: Goals will be collaboratively established with patient/family Outcome: Progressing   Problem: Self-Care: Goal: Ability to participate in self-care as condition permits will improve Outcome: Progressing Goal: Verbalization of feelings and concerns over difficulty with self-care will improve Outcome: Progressing Goal: Ability to communicate needs accurately will improve Outcome: Progressing   Problem: Nutrition: Goal: Risk of aspiration will decrease Outcome: Progressing Goal: Dietary intake will improve Outcome: Progressing   Problem: Education: Goal: Knowledge of General Education information will improve Description: Including pain rating scale, medication(s)/side effects and non-pharmacologic comfort measures Outcome: Progressing   Problem: Health Behavior/Discharge Planning: Goal: Ability to manage health-related needs will improve Outcome: Progressing   Problem: Clinical Measurements: Goal: Ability to maintain clinical measurements within normal limits will improve Outcome: Progressing Goal: Will remain free from infection Outcome: Progressing Goal: Diagnostic test results will improve Outcome: Progressing Goal: Respiratory  complications will improve Outcome: Progressing Goal: Cardiovascular complication will be avoided Outcome: Progressing   Problem: Activity: Goal: Risk for activity intolerance will decrease Outcome: Progressing   Problem: Nutrition: Goal: Adequate nutrition will be maintained Outcome: Progressing   Problem: Coping: Goal: Level of anxiety will decrease Outcome: Progressing   Problem: Elimination: Goal: Will not experience complications related to bowel motility Outcome: Progressing Goal: Will not experience complications related to urinary retention Outcome: Progressing   Problem: Pain Managment: Goal: General experience of comfort will improve Outcome: Progressing   Problem: Safety: Goal: Ability to remain free from injury will improve Outcome: Progressing   Problem: Skin Integrity: Goal: Risk for impaired skin integrity will decrease Outcome: Progressing   Problem: Education: Goal: Understanding of CV disease, CV risk reduction, and recovery process will improve Outcome: Progressing Goal: Individualized Educational Video(s) Outcome: Progressing   Problem: Activity: Goal: Ability to return to baseline activity level will improve Outcome: Progressing   Problem: Cardiovascular: Goal: Ability to achieve and maintain adequate cardiovascular perfusion will improve Outcome: Progressing Goal: Vascular access site(s) Level 0-1 will be maintained Outcome: Progressing   Problem: Health Behavior/Discharge Planning: Goal: Ability to safely manage health-related needs after discharge will improve Outcome: Progressing   Problem: Education: Goal: Ability to describe self-care measures that may prevent or decrease complications (Diabetes Survival Skills Education) will improve Outcome: Progressing Goal: Individualized Educational Video(s) Outcome: Progressing   Problem: Coping: Goal: Ability to adjust to condition or change in health will improve Outcome: Progressing    Problem: Fluid Volume: Goal: Ability to maintain a balanced intake and output will improve Outcome: Progressing   Problem: Health Behavior/Discharge Planning: Goal: Ability to identify and utilize available resources and services will improve Outcome: Progressing Goal: Ability to manage health-related needs will improve Outcome: Progressing   Problem: Metabolic: Goal: Ability to maintain appropriate glucose levels will improve Outcome: Progressing   Problem: Nutritional: Goal: Maintenance of adequate nutrition will improve Outcome: Progressing Goal: Progress toward achieving an optimal weight will improve Outcome: Progressing   Problem: Skin Integrity: Goal: Risk for impaired skin  integrity will decrease Outcome: Progressing   Problem: Tissue Perfusion: Goal: Adequacy of tissue perfusion will improve Outcome: Progressing

## 2022-11-12 NOTE — TOC Progression Note (Signed)
Transition of Care [facility]) - Progression Note    Patient Details  Name: [name] MRN: [id number] Date of Birth: 1961-04-06  [facility]) CM/SW Contact  Baldemar [place], [place] Phone Number: 11/12/2022, 11:33 AM  Clinical Narrative:   CSW spoke with Malvin Johns, Berkley Harvey is still pending. CSW updated MD with barrier to discharge. CSW to follow.    Expected Discharge Plan: Skilled Nursing Facility Barriers to Discharge: Continued Medical Work up, English as a second language teacher, SNF Pending bed offer  Expected Discharge Plan and Services In-house Referral: Clinical Social Work     Living arrangements for the past 2 months: Apartment Expected Discharge Date: 11/10/22                                     Social Determinants of Health (SDOH) Interventions SDOH Screenings   Food Insecurity: Patient Unable To Answer (10/11/2022)  Housing: Patient Unable To Answer (10/11/2022)  Transportation Needs: Patient Unable To Answer (10/11/2022)  Utilities: Patient Unable To Answer (10/11/2022)  Depression (PHQ2-9): Low Risk  (05/08/2022)  Social Connections: Unknown (08/28/2021)   Received from [facility]  Tobacco Use: High Risk (10/29/2022)    Readmission Risk Interventions     No data to display

## 2022-11-12 NOTE — Progress Notes (Signed)
Occupational Therapy Treatment Patient Details Name: [name] MRN: [id number] DOB: Nov 10, 1960 Today's Date: 11/12/2022   History of present illness Patient is a 62 y/o male admitted 10/03/22 with L weakness and numbness.  Received TNK then developed now symptoms of aphasia and R weakness.  He underwent mechanical thrombectomy L M3 segment MCA noted to have small hemorrhage and follow up CT showed progression of hemorrhage 3cm then 7cm and small acute R cerebellar infarct. PEG placed 7/10. CT on 7/13 showed slight diminished size of largest left frontal parietal lobe hematoma, but increased size of left periventricular hematoma, and several small new acute appearing hemorrhages posterior to this, and also in the right occipital lobe, left occipital lobe, left cerebellum and some hemorrhage layering in the bilateral occipital horns PMH positive for CAD s/p PCI w/stent and 1v CABG, multiple CGAs, systolic HF s/p ICD, HLD, HTN.   OT comments  Pt making incremental progress towards OT goals though remains limited by discomfort with movement. RN in to give pain meds during session. Therapists called pt's significant other during session to further encourage pt participation. Pt with improving initiation of bed mobility and RUE use though apraxia still noted. Pt able to demo self ROM for R digit extension without cues today w/ decreased digit stiffness noted. Pt able to briefly sit EOB for further exercises though pt deferred OOB due to pain.   Recommendations for follow up therapy are one component of a multi-disciplinary discharge planning process, led by the attending physician.  Recommendations may be updated based on patient status, additional functional criteria and insurance authorization.    Assistance Recommended at Discharge Frequent or constant Supervision/Assistance  Patient can return home with the following  Two people to help with walking and/or transfers;Two people to help with  bathing/dressing/bathroom;Assistance with cooking/housework;Assistance with feeding;Help with stairs or ramp for entrance;Direct supervision/assist for financial management;Assist for transportation;Direct supervision/assist for medications management   Equipment Recommendations  Wheelchair cushion (measurements OT);Wheelchair (measurements OT);Other (comment) (hoyer lift)    Recommendations for Other Services      Precautions / Restrictions Precautions Precautions: Fall;Other (comment) Precaution Comments: PEG, abdominal binder Restrictions Weight Bearing Restrictions: No       Mobility Bed Mobility Overal bed mobility: Needs Assistance Bed Mobility: Rolling, Supine to Sit, Sit to Sidelying Rolling: Mod assist, Min assist   Supine to sit: Mod assist, +2 for safety/equipment, +2 for physical assistance   Sit to sidelying: Max assist, +2 for safety/equipment, +2 for physical assistance General bed mobility comments: Mod A x 2 to sit EOB with some assist to bring LEs off of bed and to lift trunk. pt initiating and able to pull with L UE on bedrail. max A x 2 for safe guiding back to bed with LE assist and trunk d/t increasing pain. Pt able to roll to R side more easily for peri care at Min A and Mod A for rolling to L side. hand over hand assist to reach/grab bedrail with RUE on L side    Transfers                   General transfer comment: deferred d/t pt reported pain     Balance Overall balance assessment: Needs assistance Sitting-balance support: Single extremity supported, Feet supported Sitting balance-Leahy Scale: Fair Sitting balance - Comments: min guard overall but w/ increasing pain, pt would push back requiring Mod A to correct/maintain balance. Postural control: Posterior lean  ADL either performed or assessed with clinical judgement   ADL Overall ADL's : Needs assistance/impaired     Grooming: Min  guard;Sitting;Wash/dry face Grooming Details (indicate cue type and reason): using L UE to wash face. min guard for sitting balance EOB Upper Body Bathing: Moderate assistance;Sitting Upper Body Bathing Details (indicate cue type and reason): assist for back sitting EOB                 Toileting- Clothing Manipulation and Hygiene: Total assistance;Bed level Toileting - Clothing Manipulation Details (indicate cue type and reason): for peri care after bowel incontinence       General ADL Comments: Pt with improving motor planning, initiation of bed mobility and demonstrating ability to stretch R digits using L digits    Extremity/Trunk Assessment Upper Extremity Assessment Upper Extremity Assessment: RUE deficits/detail RUE Deficits / Details: able to  make fist and extend digits actively though increased stiffness noted in PIPs. able to stretch digits more easily today though overall discomfort noted when attempting full range extension. hand over hand assist to bend elbow. AAROM for shoulder abduction/flexion to 90* noted darker area on L 1st digit on dorsal aspect, likely from splint - discontinued splint orders and encouraged pt to rest with digits extended 7/22. Today 7/25- pt independently showing use of L hand to stretch R digits RUE Sensation: decreased light touch RUE Coordination: decreased fine motor;decreased gross motor LUE Deficits / Details: Using LUE, often times when cued to use R UE, involuntary movements at times LUE Coordination: decreased gross motor;decreased fine motor   Lower Extremity Assessment Lower Extremity Assessment: Defer to PT evaluation        Vision   Vision Assessment?: No apparent visual deficits   Perception     Praxis      Cognition Arousal/Alertness: Awake/alert Behavior During Therapy: WFL for tasks assessed/performed Overall Cognitive Status: Difficult to assess Area of Impairment: Attention, Safety/judgement, Awareness, Following  commands, Problem solving                   Current Attention Level: Selective Memory: Decreased short-term memory Following Commands: Follows one step commands with increased time, Follows one step commands inconsistently Safety/Judgement: Decreased awareness of deficits, Decreased awareness of safety Awareness: Intellectual, Emergent Problem Solving: Slow processing, Requires verbal cues, Requires tactile cues General Comments: speech with continued improvements, able to understand at least 75% of pt speech/needs expression. follows directions inconsistently d/t apraxia, impaired motor planning.        Exercises Exercises: Other exercises Other Exercises Other Exercises: AAROM R UE Other Exercises: self ROM with for R digit extension    Shoulder Instructions       General Comments      Pertinent Vitals/ Pain       Pain Assessment Pain Assessment: Faces Faces Pain Scale: Hurts even more Pain Location: R shoulder, likely R hip with EOB Pain Descriptors / Indicators: Discomfort, Grimacing Pain Intervention(s): Monitored during session, Limited activity within patient's tolerance, RN gave pain meds during session  Home Living                                          Prior Functioning/Environment              Frequency  Min 1X/week        Progress Toward Goals  OT Goals(current goals can now be found in  the care plan section)  Progress towards OT goals: Progressing toward goals  Acute Rehab OT Goals OT Goal Formulation: With patient Time For Goal Achievement: 11/19/22 Potential to Achieve Goals: Fair ADL Goals Pt Will Perform Eating: with supervision;sitting;bed level;with adaptive utensils Pt Will Perform Grooming: with min guard assist;sitting Pt Will Perform Upper Body Bathing: with mod assist;sitting Pt Will Perform Lower Body Bathing: with mod assist;sitting/lateral leans Pt Will Perform Upper Body Dressing: with mod  assist;sitting Pt Will Perform Lower Body Dressing: with max assist;sitting/lateral leans;sit to/from stand Pt Will Transfer to Toilet: squat pivot transfer;stand pivot transfer;bedside commode;with mod assist Additional ADL Goal #1: Patient will tolerate R resting hand splint 4 hours on and 4 off with no skin integrity issues.  Plan Discharge plan remains appropriate    Co-evaluation    PT/OT/SLP Co-Evaluation/Treatment: Yes Reason for Co-Treatment: For patient/therapist safety;To address functional/ADL transfers   OT goals addressed during session: ADL's and self-care;Strengthening/ROM      AM-PAC OT "6 Clicks" Daily Activity     Outcome Measure   Help from another person eating meals?: A Lot Help from another person taking care of personal grooming?: A Little Help from another person toileting, which includes using toliet, bedpan, or urinal?: Total Help from another person bathing (including washing, rinsing, drying)?: Total Help from another person to put on and taking off regular upper body clothing?: A Lot Help from another person to put on and taking off regular lower body clothing?: Total 6 Click Score: 10    End of Session    OT Visit Diagnosis: Unsteadiness on feet (R26.81);Other abnormalities of gait and mobility (R26.89);Muscle weakness (generalized) (M62.81);Cognitive communication deficit (R41.841);Hemiplegia and hemiparesis;Low vision, both eyes (H54.2);Apraxia (R48.2);Other symptoms and signs involving cognitive function Symptoms and signs involving cognitive functions: Cerebral infarction Hemiplegia - Right/Left: Right Hemiplegia - dominant/non-dominant: Dominant Hemiplegia - caused by: Cerebral infarction   Activity Tolerance Patient limited by pain   Patient Left in bed;with call bell/phone within reach;with bed alarm set   Nurse Communication Mobility status        Time: 1610-9604 OT Time Calculation (min): 40 min  Charges: OT General Charges $OT  Visit: 1 Visit OT Treatments $Therapeutic Activity: 8-22 mins $Therapeutic Exercise: 8-22 mins  Bradd Canary, OTR/L Acute Rehab Services Office: [phone]   Lorre Munroe 11/12/2022, 2:23 PM

## 2022-11-12 NOTE — Progress Notes (Signed)
[facility] PROGRESS NOTE  IMANUEL HIRSCHFELD (DOB: 10/08/1960) ZOX:[id number] PCP: [name], MD  Brief Narrative: Mr. Morie is a 62 y.o. 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  Remains in the hospital.  Hospital course complicated by hemorrhagic conversion of the stroke. Improving now.  Patient remains medically stable for discharge - awaiting safe disposition (SNF) pending insurance authorization.   6/18: Admitted for stroke, received TNK; developed right sided deficits overnight after TNK, underwent NIR thrombectomy 6/19: Post thrombectomy CT showed left frontal IPH and SAH 6/20: Heparin started cautiously 6/22: CT showed progression of hematomas, heparin stopped 6/23: Repeat echo shows dehiscence of prior pseudoaneurysm patch with adherent LV thrombus; Cardiology consulted 6/26: ID consulted for MSSE bacteremia 6/28: EP consulted, recommended not to take out ICD; CT stabilized, heparin restarted 7/2: Transferred OOU 7/10: Placed PEG tube 7/11: Transitioned to basal bolus feedings and switched to Eliquis 7/13: Increased HA, repeat CT shows progression of ICHs; Eliquis held 7/15: TEE negative, antibiotics stopped 7/17: Eliquis restarted   Significant studies: 6/18 CTA head and neck: no LVO 6/18 repeat CTA head and neck: new distal left MCA M2 occlusion 6/19 Echo: poor quality images, nondiagnostic 6/19 TCD: positive study, indicating R>L shunt 6/19 LE doppler: no DVT 6/19 CT head: left frontal parietal IPH and adjacent [facility] 6/22 CT head: size now 8x4x4cm, with new 2cm component 6/23 complete echo: now shows recurrence of pseudoaneurysm, dehiscence of patch with adherent thrombus 6/25 coronary CTA: confirms above 6/28 CT head: stable large left frontoparietal hematoma 7/13: CT head shows  worsening ICH 7/15 TEE: shows pseudoaneurysm with adherent clot, no valvular vegetations, no signs of vegetation on ICD leads 7/16: CT head shows regression of ICH noted 7/13 7/19: CT head shows no change in ICH   Procedures: 6/18: TNK at 1246 6/19: Thrombectomy by Dr. Tommie Sams 6/19: Cortrak placed 7/10: PEG placed 7/15: TEE 7/26: Seeking SNF placement. Medically stable to discharge.  Subjective: No acute issues or events overnight, excited for the prospect of being discharged  Objective: BP 105/75 (BP Location: Left Arm)   Pulse 84   Temp 98 F (36.7 C)   Resp 16   Ht 6\' 1"  (1.854 m)   Wt 97.6 kg   SpO2 96%   BMI 28.39 kg/m   Gen: No distress Pulm: Clear, nonlabored  CV: RRR, no MRG or edema GI: Soft, NT, ND, +BS. PEG site under abd binder, c/d/i Neuro: Alert and oriented, stable deficits, dysarthria. No new focal deficits. Ext: Warm, no deformities Skin: No new rashes, lesions or ulcers on visualized skin   Assessment & Plan:   Principal Problem:   Stroke (cerebrum) (HCC) Active Problems:   Bacteremia due to Gram-positive bacteria   Pseudoaneurysm of left ventricle of heart   Acute respiratory failure with hypoxia (HCC)   Coronary artery disease   Dysphagia   Dyslipidemia   Essential hypertension   Obesity (BMI 30.0-34.9)   ICD (implantable cardioverter-defibrillator) in place   Acute on chronic systolic CHF (congestive heart failure) (HCC)   Aspiration pneumonia (HCC)   Thrombocytopenia (HCC)   Cerebral edema (HCC)  Acute stroke with intraparenchymal hematoma: Patient admitted and underwent TNK.  Later same day had second stroke with left M2 occlusion and underwent thrombectomy. Source of both likely cardioembolic from pseudoaneurysm. Unfortunately,  post-TNK/thrombectomy course complicated by ICH and SAH.   - Heparin was started due to pseudoaneurysm but ICH size increased so this was held for 1 week.  Serial CTs eventually showed stabilization,  transitioned to heparin again, and finally eliquis. Continue Lipitor.    S. epidermidis bacteremia: Completed 2 weeks IV ancef per ID. No valvular or ICD lead vegetations by TEE.     Pseudoaneurysm of left ventricle: Dx 2014 s/p patch repair of rupture w/pericardial tamponade and CABG x1 (WVG > OM1) Oct 2014 by Dr. Dorris Fetch. Stable in Jan 2024 by echo, but patch noted to be dehisced with adherent thrombus on echo this admission. No signs or symptoms of tamponade at this time.   - Continue long term anticoagulation (eliquis) per cardiology. We've discontinued DAPT in light of full dose anticoagulation. - Follow up with cardiothoracic surgery for consideration of repeat repair if he is surgical candidate.     Acute on chronic HFrEF, HTN: Now euvolemic after Tx.  - Continue monitoring volume status.  Sherryll Burger had been held due to soft BP. BP up a bit, will restart entresto at lower dose. Will recheck metabolic profile and if K, Cr stable. - Continue low doses of coreg, lasix 20mg  daily, spironolactone.  - Consider restart SGLT2i as well    Dyslipidemia - Continue lipitor   Dysphagia: PEG placed 7/10.   - Oral intake has been sufficient. Hold bolus tube feeds if pt takes 75% of meal.    Thrombocytopenia: Improved - Recheck for stability today.   Aspiration pneumonia: Tx w/IV antibiotics, resolved.    ICD in place:  Adjustment reaction: - Was started on fluoxetine 10mg .  Azucena Fallen, DO [facility] [email or web address] 11/12/2022, 8:19 AM

## 2022-11-12 NOTE — Plan of Care (Signed)
Problem: Education: Goal: Knowledge of disease or condition will improve Outcome: Progressing Goal: Knowledge of secondary prevention will improve (MUST DOCUMENT ALL) Outcome: Progressing Goal: Knowledge of patient specific risk factors will improve [name] N/A or DELETE if not current risk factor) Outcome: Progressing   Problem: Ischemic Stroke/TIA Tissue Perfusion: Goal: Complications of ischemic stroke/TIA will be minimized Outcome: Progressing   Problem: Coping: Goal: Will verbalize positive feelings about self Outcome: Progressing Goal: Will identify appropriate support needs Outcome: Progressing   Problem: Health Behavior/Discharge Planning: Goal: Ability to manage health-related needs will improve Outcome: Progressing Goal: Goals will be collaboratively established with patient/family Outcome: Progressing   Problem: Self-Care: Goal: Ability to participate in self-care as condition permits will improve Outcome: Progressing Goal: Verbalization of feelings and concerns over difficulty with self-care will improve Outcome: Progressing Goal: Ability to communicate needs accurately will improve Outcome: Progressing   Problem: Nutrition: Goal: Risk of aspiration will decrease Outcome: Progressing Goal: Dietary intake will improve Outcome: Progressing   Problem: Education: Goal: Knowledge of General Education information will improve Description: Including pain rating scale, medication(s)/side effects and non-pharmacologic comfort measures Outcome: Progressing   Problem: Health Behavior/Discharge Planning: Goal: Ability to manage health-related needs will improve Outcome: Progressing   Problem: Clinical Measurements: Goal: Ability to maintain clinical measurements within normal limits will improve Outcome: Progressing Goal: Will remain free from infection Outcome: Progressing Goal: Diagnostic test results will improve Outcome: Progressing Goal: Respiratory  complications will improve Outcome: Progressing Goal: Cardiovascular complication will be avoided Outcome: Progressing   Problem: Activity: Goal: Risk for activity intolerance will decrease Outcome: Progressing   Problem: Nutrition: Goal: Adequate nutrition will be maintained Outcome: Progressing   Problem: Coping: Goal: Level of anxiety will decrease Outcome: Progressing   Problem: Elimination: Goal: Will not experience complications related to bowel motility Outcome: Progressing Goal: Will not experience complications related to urinary retention Outcome: Progressing   Problem: Pain Managment: Goal: General experience of comfort will improve Outcome: Progressing   Problem: Safety: Goal: Ability to remain free from injury will improve Outcome: Progressing   Problem: Skin Integrity: Goal: Risk for impaired skin integrity will decrease Outcome: Progressing   Problem: Education: Goal: Understanding of CV disease, CV risk reduction, and recovery process will improve Outcome: Progressing Goal: Individualized Educational Video(s) Outcome: Progressing   Problem: Activity: Goal: Ability to return to baseline activity level will improve Outcome: Progressing   Problem: Cardiovascular: Goal: Ability to achieve and maintain adequate cardiovascular perfusion will improve Outcome: Progressing Goal: Vascular access site(s) Level 0-1 will be maintained Outcome: Progressing   Problem: Health Behavior/Discharge Planning: Goal: Ability to safely manage health-related needs after discharge will improve Outcome: Progressing   Problem: Education: Goal: Ability to describe self-care measures that may prevent or decrease complications (Diabetes Survival Skills Education) will improve Outcome: Progressing Goal: Individualized Educational Video(s) Outcome: Progressing   Problem: Coping: Goal: Ability to adjust to condition or change in health will improve Outcome: Progressing    Problem: Fluid Volume: Goal: Ability to maintain a balanced intake and output will improve Outcome: Progressing   Problem: Health Behavior/Discharge Planning: Goal: Ability to identify and utilize available resources and services will improve Outcome: Progressing Goal: Ability to manage health-related needs will improve Outcome: Progressing   Problem: Metabolic: Goal: Ability to maintain appropriate glucose levels will improve Outcome: Progressing   Problem: Nutritional: Goal: Maintenance of adequate nutrition will improve Outcome: Progressing Goal: Progress toward achieving an optimal weight will improve Outcome: Progressing   Problem: Skin Integrity: Goal: Risk for impaired skin  integrity will decrease Outcome: Progressing   Problem: Tissue Perfusion: Goal: Adequacy of tissue perfusion will improve Outcome: Progressing

## 2022-11-12 NOTE — Progress Notes (Signed)
Physical Therapy Treatment  Patient Details Name: [name] MRN: [id number] DOB: 03-16-1961 Today's Date: 11/12/2022   History of Present Illness Patient is a 62 y/o male admitted 10/03/22 with L weakness and numbness.  Received TNK then developed now symptoms of aphasia and R weakness.  He underwent mechanical thrombectomy L M3 segment MCA noted to have small hemorrhage and follow up CT showed progression of hemorrhage 3cm then 7cm and small acute R cerebellar infarct. PEG placed 7/10. CT on 7/13 showed slight diminished size of largest left frontal parietal lobe hematoma, but increased size of left periventricular hematoma, and several small new acute appearing hemorrhages posterior to this, and also in the right occipital lobe, left occipital lobe, left cerebellum and some hemorrhage layering in the bilateral occipital horns PMH positive for CAD s/p PCI w/stent and 1v CABG, multiple CGAs, systolic HF s/p ICD, HLD, HTN.    PT Comments  Pt seen in conjunction with OT given increased pain with mobility in recent therapy sessions. Pt premedicated and was able to tolerate minimal EOB activity due to fatigue and pain. Significant other provided encouragement to participate via phone call. Continued rehab <3 hours/day remains the most appropriate disposition at d/c. Will continue to follow.    If plan is discharge home, recommend the following: Two people to help with walking and/or transfers;A lot of help with bathing/dressing/bathroom;Assistance with cooking/housework;Direct supervision/assist for medications management;Assist for transportation;Help with stairs or ramp for entrance;Assistance with feeding   Can travel by private vehicle     No  Equipment Recommendations   (TBD at next venue)    Recommendations for Other Services Rehab consult     Precautions / Restrictions Precautions Precautions: Fall;Other (comment) Precaution Comments: PEG, abdominal binder Restrictions Weight  Bearing Restrictions: No     Mobility  Bed Mobility Overal bed mobility: Needs Assistance Bed Mobility: Rolling, Sit to Sidelying, Sidelying to Sit Rolling: Mod assist, Min assist (Mod to the L, min to the R) Sidelying to sit: Mod assist, HOB elevated Supine to sit: Mod assist, +2 for safety/equipment, +2 for physical assistance   Sit to sidelying: Max assist, +2 for safety/equipment, +2 for physical assistance General bed mobility comments: Hand over hand assist for reaching to bed rail and for rolling. Pt utilizing strong LLE for scooting and rolling. Assist for BLE off EOB and trunk elevation to full sitting position.    Transfers                   General transfer comment: Pt declined due to pain.    Ambulation/Gait               General Gait Details: Unable.   Stairs             Wheelchair Mobility     Tilt Bed    Modified Rankin (Stroke Patients Only) Modified Rankin (Stroke Patients Only) Pre-Morbid Rankin Score: Moderate disability Modified Rankin: Severe disability     Balance Overall balance assessment: Needs assistance Sitting-balance support: Single extremity supported, Feet supported Sitting balance-Leahy Scale: Fair Sitting balance - Comments: min guard overall but w/ increasing pain, pt would push back requiring Mod A to correct/maintain balance. Postural control: Posterior lean                                  Cognition Arousal/Alertness: Awake/alert Behavior During Therapy: WFL for tasks assessed/performed Overall Cognitive Status: Difficult to assess  Area of Impairment: Attention, Safety/judgement, Awareness, Following commands, Problem solving                   Current Attention Level: Selective Memory: Decreased short-term memory Following Commands: Follows one step commands with increased time, Follows one step commands inconsistently Safety/Judgement: Decreased awareness of deficits, Decreased  awareness of safety Awareness: Intellectual, Emergent Problem Solving: Slow processing, Requires verbal cues, Requires tactile cues General Comments: speech with continued improvements, able to understand at least 75% of pt speech/needs expression. follows directions inconsistently d/t apraxia, impaired motor planning.        Exercises General Exercises - Lower Extremity Long Arc Quad: 5 reps, Both, Seated Hip Flexion/Marching: 5 reps, Both, Seated    General Comments        Pertinent Vitals/Pain Pain Assessment Pain Assessment: Faces Faces Pain Scale: Hurts even more Pain Location: R shoulder, R hip with EOB Pain Descriptors / Indicators: Discomfort, Grimacing Pain Intervention(s): Limited activity within patient's tolerance, Monitored during session, Repositioned    Home Living                          Prior Function            PT Goals (current goals can now be found in the care plan section) Acute Rehab PT Goals Patient Stated Goal: Decrease pain PT Goal Formulation: Patient unable to participate in goal setting Time For Goal Achievement: 11/13/22 Potential to Achieve Goals: Fair Progress towards PT goals: Progressing toward goals    Frequency    Min 1X/week      PT Plan Current plan remains appropriate    Co-evaluation PT/OT/SLP Co-Evaluation/Treatment: Yes Reason for Co-Treatment: For patient/therapist safety;To address functional/ADL transfers PT goals addressed during session: Mobility/safety with mobility;Balance;Proper use of DME;Strengthening/ROM OT goals addressed during session: ADL's and self-care;Strengthening/ROM      AM-PAC PT "6 Clicks" Mobility   Outcome Measure  Help needed turning from your back to your side while in a flat bed without using bedrails?: A Lot Help needed moving from lying on your back to sitting on the side of a flat bed without using bedrails?: Total Help needed moving to and from a bed to a chair (including  a wheelchair)?: Total Help needed standing up from a chair using your arms (e.g., wheelchair or bedside chair)?: Total Help needed to walk in hospital room?: Total Help needed climbing 3-5 steps with a railing? : Total 6 Click Score: 7    End of Session Equipment Utilized During Treatment: Gait belt Activity Tolerance: Patient tolerated treatment well Patient left: in bed;with call bell/phone within reach;with bed alarm set Nurse Communication: Mobility status;Need for lift equipment PT Visit Diagnosis: Other abnormalities of gait and mobility (R26.89);Other symptoms and signs involving the nervous system (R29.898);Hemiplegia and hemiparesis Hemiplegia - Right/Left: Right Hemiplegia - dominant/non-dominant: Dominant Hemiplegia - caused by: Cerebral infarction;Other Nontraumatic intracranial hemorrhage     Time: 6270-3500 PT Time Calculation (min) (ACUTE ONLY): 26 min  Charges:    $Therapeutic Activity: 8-22 mins PT General Charges $$ ACUTE PT VISIT: 1 Visit                     Conni Slipper, PT, DPT Acute Rehabilitation Services Secure Chat Preferred Office: [phone]    Marylynn Pearson 11/12/2022, 2:46 PM

## 2022-11-13 DIAGNOSIS — I63413 Cerebral infarction due to embolism of bilateral middle cerebral arteries: Secondary | ICD-10-CM | POA: Diagnosis not present

## 2022-11-13 DIAGNOSIS — R7881 Bacteremia: Secondary | ICD-10-CM | POA: Diagnosis not present

## 2022-11-13 DIAGNOSIS — I253 Aneurysm of heart: Secondary | ICD-10-CM | POA: Diagnosis not present

## 2022-11-13 DIAGNOSIS — I5023 Acute on chronic systolic (congestive) heart failure: Secondary | ICD-10-CM | POA: Diagnosis not present

## 2022-11-13 LAB — GLUCOSE, CAPILLARY
Glucose-Capillary: 87 mg/dL (ref 70–99)
Glucose-Capillary: 147 mg/dL — ABNORMAL HIGH (ref 70–99)
Glucose-Capillary: 102 mg/dL — ABNORMAL HIGH (ref 70–99)
Glucose-Capillary: 131 mg/dL — ABNORMAL HIGH (ref 70–99)

## 2022-11-13 MED ORDER — JEVITY 1.5 CAL/FIBER PO LIQD
237.0000 mL | Freq: Three times a day (TID) | ORAL | Status: DC
  Administered 2022-11-13 – 2022-11-15 (×7): 237 mL
  Filled 2022-11-13 (×15): qty 237

## 2022-11-13 NOTE — Care Management Important Message (Signed)
Important Message  Patient Details  Name: [name] MRN: [id number] Date of Birth: May 22, 1960   Medicare Important Message Given:  Yes     Winfrey Chillemi 11/13/2022, 3:30 PM

## 2022-11-13 NOTE — Progress Notes (Signed)
Speech Language Pathology Treatment: Cognitive-Linquistic  Patient Details Name: [name] MRN: [id number] DOB: 1960-09-23 Today's Date: 11/13/2022 Time: 5284-1324 SLP Time Calculation (min) (ACUTE ONLY): 30 min  Assessment / Plan / Recommendation Clinical Impression  Pt reports stomach pain and feeling nauseous prior to session, but agreeable to participating. He belched throughout the session and stated that belching is different from baseline. RN notified. SLP used aug com device to show pt flashcards of simple nouns to target confrontational naming. He produced the initial sound of all words shown with Min verbal cues and was intermittently able to repeat the whole word, although was inconsistent if prompted to produce the same word multiple times. Pt effectively able to communicate his personal preferences (sports teams) given choices and yes/no questions with intermittent moments of fluency at the phrase level. Pt then used the symbols on the aug com device to report what he had for breakfast given Mod verbal and visual cues, with accuracy confirmed by RN. He continues to have some difficulty attending to pictures presented on the R side, requiring Mod visual cues to attend. He fluently asked SLP why he is still having difficulty understanding what people are saying to him. SLP provided education regarding nature of receptive aphasia and differences in cognition that he has experienced as well as plan for continued ST intervention. Pt continues to show great potential for use of a personal aug com device as well as with continued use with SLP. Will continue to f/u to target expressive and receptive deficits.    HPI HPI: Pt is a 62 y/o male who presented 6/19 with L weakness and numbness. Pt received TNK then developed new symptoms of aphasia and R weakness. He underwent mechanical thrombectomy L M3 segment MCA 6/19. Pt noted to have small hemorrhage and follow up CT showed progression of  hemorrhage 3cm then 7cm and small acute R cerebellar infarct. PEG placed 7/10. PMH: CAD s/p PCI w/stent and 1v CABG, multiple CGAs, systolic HF s/p ICD, HLD, HTN.      SLP Plan  Continue with current plan of care      Recommendations for follow up therapy are one component of a multi-disciplinary discharge planning process, led by the attending physician.  Recommendations may be updated based on patient status, additional functional criteria and insurance authorization.    Recommendations                     Oral care BID   Frequent or constant Supervision/Assistance Aphasia (R47.01);Cognitive communication deficit (R41.841);Dysarthria and anarthria (R47.1)     Continue with current plan of care     [name], M.A., CF-SLP Speech Language Pathology, Acute Rehabilitation Services  Secure Chat preferred [phone]   11/13/2022, 11:37 AM

## 2022-11-13 NOTE — Plan of Care (Signed)
Problem: Education: Goal: Knowledge of disease or condition will improve Outcome: Progressing Goal: Knowledge of secondary prevention will improve (MUST DOCUMENT ALL) Outcome: Progressing Goal: Knowledge of patient specific risk factors will improve [name] N/A or DELETE if not current risk factor) Outcome: Progressing   Problem: Ischemic Stroke/TIA Tissue Perfusion: Goal: Complications of ischemic stroke/TIA will be minimized Outcome: Progressing   Problem: Coping: Goal: Will verbalize positive feelings about self Outcome: Progressing Goal: Will identify appropriate support needs Outcome: Progressing   Problem: Health Behavior/Discharge Planning: Goal: Ability to manage health-related needs will improve Outcome: Progressing Goal: Goals will be collaboratively established with patient/family Outcome: Progressing   Problem: Self-Care: Goal: Ability to participate in self-care as condition permits will improve Outcome: Progressing Goal: Verbalization of feelings and concerns over difficulty with self-care will improve Outcome: Progressing Goal: Ability to communicate needs accurately will improve Outcome: Progressing   Problem: Nutrition: Goal: Risk of aspiration will decrease Outcome: Progressing Goal: Dietary intake will improve Outcome: Progressing   Problem: Education: Goal: Knowledge of General Education information will improve Description: Including pain rating scale, medication(s)/side effects and non-pharmacologic comfort measures Outcome: Progressing   Problem: Health Behavior/Discharge Planning: Goal: Ability to manage health-related needs will improve Outcome: Progressing   Problem: Clinical Measurements: Goal: Ability to maintain clinical measurements within normal limits will improve Outcome: Progressing Goal: Will remain free from infection Outcome: Progressing Goal: Diagnostic test results will improve Outcome: Progressing Goal: Respiratory  complications will improve Outcome: Progressing Goal: Cardiovascular complication will be avoided Outcome: Progressing   Problem: Activity: Goal: Risk for activity intolerance will decrease Outcome: Progressing   Problem: Nutrition: Goal: Adequate nutrition will be maintained Outcome: Progressing   Problem: Coping: Goal: Level of anxiety will decrease Outcome: Progressing   Problem: Elimination: Goal: Will not experience complications related to bowel motility Outcome: Progressing Goal: Will not experience complications related to urinary retention Outcome: Progressing   Problem: Pain Managment: Goal: General experience of comfort will improve Outcome: Progressing   Problem: Safety: Goal: Ability to remain free from injury will improve Outcome: Progressing   Problem: Skin Integrity: Goal: Risk for impaired skin integrity will decrease Outcome: Progressing   Problem: Education: Goal: Understanding of CV disease, CV risk reduction, and recovery process will improve Outcome: Progressing Goal: Individualized Educational Video(s) Outcome: Progressing   Problem: Activity: Goal: Ability to return to baseline activity level will improve Outcome: Progressing   Problem: Cardiovascular: Goal: Ability to achieve and maintain adequate cardiovascular perfusion will improve Outcome: Progressing Goal: Vascular access site(s) Level 0-1 will be maintained Outcome: Progressing   Problem: Health Behavior/Discharge Planning: Goal: Ability to safely manage health-related needs after discharge will improve Outcome: Progressing   Problem: Education: Goal: Ability to describe self-care measures that may prevent or decrease complications (Diabetes Survival Skills Education) will improve Outcome: Progressing Goal: Individualized Educational Video(s) Outcome: Progressing   Problem: Coping: Goal: Ability to adjust to condition or change in health will improve Outcome: Progressing    Problem: Fluid Volume: Goal: Ability to maintain a balanced intake and output will improve Outcome: Progressing   Problem: Health Behavior/Discharge Planning: Goal: Ability to identify and utilize available resources and services will improve Outcome: Progressing Goal: Ability to manage health-related needs will improve Outcome: Progressing   Problem: Metabolic: Goal: Ability to maintain appropriate glucose levels will improve Outcome: Progressing   Problem: Nutritional: Goal: Maintenance of adequate nutrition will improve Outcome: Progressing Goal: Progress toward achieving an optimal weight will improve Outcome: Progressing   Problem: Skin Integrity: Goal: Risk for impaired skin  integrity will decrease Outcome: Progressing   Problem: Tissue Perfusion: Goal: Adequacy of tissue perfusion will improve Outcome: Progressing

## 2022-11-13 NOTE — Progress Notes (Addendum)
Nutrition Follow-up  DOCUMENTATION CODES:  Not applicable  INTERVENTION:  Continue current diet as ordered per SLP Continue the following: Jevity 1.5 4x/d ( total/d or 4 cartons) Free water 30mL free water before and after each bolus This provides 1420 kcal, 60g protein, and of free water ( total free water) Per MD request, hold bolus if pt eats more than 75% of his meal Banatrol BID  NUTRITION DIAGNOSIS:  Inadequate oral intake related to inability to eat as evidenced by NPO status. Ongoing.   GOAL:  Patient will meet greater than or equal to 90% of their needs Met with TF at goal, diet advanced  MONITOR:  Diet advancement, Labs, Weight trends, TF tolerance, I & O's  REASON FOR ASSESSMENT:  Consult Enteral/tube feeding initiation and management  ASSESSMENT:  62 y.o. male presented to the ED with L side weakness, slurred speech, slow speech, and feeling dizzy. PMH includes CAD, CHF, HLD, and HTN. Pt admitted for stroke.  6/18 - Admitted; s/p TNK 6/19 - s/p thrombectomy of L M3; failed bedside swallow; Cortrak placed; tip distal stomach 7/10 - PEG placed in OR via EGD by surgery team 7/16 - MBS, DYS2, thin liquids 7/22 - SLP evaluation, DYS3/thin  Pt resting in bed. Based on kcal done last week, bolus regimen appropriate. Meal tickets saved by nursing staff over the last few days and upon review. Intake is variable but appears to be improved over the last few days:  7/25-7/26: 4/6 tickets available, pt consumed 320kcal, 10g protein 7/27: 1098kcal, 42g protein 7/28: 1354 kcal, 50g protein  Pt still not meeting his estimated needs, but will reduce to 1 carton at each feed. Would recommend RD at SNF continue to adjust feeds as needed  Average Meal Intake: 7/16-7/20: 31% average intake x 8 recorded meals 7/22: 30% intake x 2 recorded meals 7/27-7/30: 68% intake x 8 recorded meals  Nutritionally Relevant Medications: Scheduled Meds:  atorvastatin   80 mg Daily   famotidine  20 mg BID   JEVITY 1.5 CAL/FIBER  300 mL TID PC & HS   BANATROL TF  60 mL BID   free water  60 mL TID PC & HS   insulin aspart  0-5 Units QHS   insulin aspart  0-6 Units TID WC   multivitamin with minerals  1 tablet Daily   polyethylene glycol  17 g BID   spironolactone  12.5 mg Daily   PRN Meds: ondansetron  Labs Reviewed: CBG ranges from 89-147 mg/dL over the last 24 hours  Admission weight: 108.6 kg Current weight: 100 kg  Diet Order:   Diet Order             DIET DYS 3 Room service appropriate? Yes with Assist; Fluid consistency: Thin  Diet effective now                   EDUCATION NEEDS:  Not appropriate for education at this time  Skin:  Skin Assessment: Reviewed RN Assessment  Last BM:  7/28  Height:  Ht Readings from Last 1 Encounters:  10/03/22 6\' 1"  (1.854 m)    Weight:  Wt Readings from Last 1 Encounters:  11/13/22 100 kg    Ideal Body Weight:  83.6 kg  BMI:  Body mass index is 29.09 kg/m.  Estimated Nutritional Needs:  Kcal:  2200-2400 kcal/d Protein:  105-125 g/d Fluid:  >/= 2 L    Greig Castilla, RD, LDN Clinical Dietitian RD pager # available in  [facility]  After hours/weekend pager # available in [facility]

## 2022-11-13 NOTE — Progress Notes (Signed)
[facility] PROGRESS NOTE  [name] (DOB: 1960-06-22) ZOX:[id number] PCP: [name], MD  Brief Narrative: [name] is a 63 y.o. 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  Remains in the hospital.  Hospital course complicated by hemorrhagic conversion of the stroke. Improving now.  Patient remains medically stable for discharge - awaiting safe disposition (SNF) pending insurance authorization.   6/18: Admitted for stroke, received TNK; developed right sided deficits overnight after TNK, underwent NIR thrombectomy 6/19: Post thrombectomy CT showed left frontal IPH and SAH 6/20: Heparin started cautiously 6/22: CT showed progression of hematomas, heparin stopped 6/23: Repeat echo shows dehiscence of prior pseudoaneurysm patch with adherent LV thrombus; Cardiology consulted 6/26: ID consulted for MSSE bacteremia 6/28: EP consulted, recommended not to take out ICD; CT stabilized, heparin restarted 7/2: Transferred [facility] 7/10: Placed PEG tube 7/11: Transitioned to basal bolus feedings and switched to Eliquis 7/13: Increased HA, repeat CT shows progression of ICHs; Eliquis held 7/15: TEE negative, antibiotics stopped 7/17: Eliquis restarted   Significant studies: 6/18 CTA head and neck: no LVO 6/18 repeat CTA head and neck: new distal left MCA M2 occlusion 6/19 Echo: poor quality images, nondiagnostic 6/19 TCD: positive study, indicating R>L shunt 6/19 LE doppler: no DVT 6/19 CT head: left frontal parietal IPH and adjacent [facility] 6/22 CT head: size now 8x4x4cm, with new 2cm component 6/23 complete echo: now shows recurrence of pseudoaneurysm, dehiscence of patch with adherent thrombus 6/25 coronary CTA: confirms above 6/28 CT head: stable large left frontoparietal hematoma 7/13: CT head shows  worsening ICH 7/15 TEE: shows pseudoaneurysm with adherent clot, no valvular vegetations, no signs of vegetation on ICD leads 7/16: CT head shows regression of ICH noted 7/13 7/19: CT head shows no change in ICH   Procedures: 6/18: TNK at 1246 6/19: Thrombectomy by Dr. Tommie Sams 6/19: Cortrak placed 7/10: PEG placed 7/15: TEE 7/26: Seeking SNF placement. Medically stable to discharge.  Subjective: No acute issues or events overnight, excited for the prospect of being discharged  Objective: BP 105/84 (BP Location: Right Arm)   Pulse 80   Temp 98.6 F (37 C) (Oral)   Resp 18   Ht 6\' 1"  (1.854 m)   Wt 100 kg   SpO2 99%   BMI 29.09 kg/m   Gen: No distress Pulm: Clear, nonlabored  CV: RRR, no MRG or edema GI: Soft, NT, ND, +BS. PEG site under abd binder, c/d/i Neuro: Alert and oriented, stable deficits, dysarthria. No new focal deficits. Ext: Warm, no deformities Skin: No new rashes, lesions or ulcers on visualized skin   Assessment & Plan:   Principal Problem:   Stroke (cerebrum) (HCC) Active Problems:   Bacteremia due to Gram-positive bacteria   Pseudoaneurysm of left ventricle of heart   Acute respiratory failure with hypoxia (HCC)   Coronary artery disease   Dysphagia   Dyslipidemia   Essential hypertension   Obesity (BMI 30.0-34.9)   ICD (implantable cardioverter-defibrillator) in place   Acute on chronic systolic CHF (congestive heart failure) (HCC)   Aspiration pneumonia (HCC)   Thrombocytopenia (HCC)   Cerebral edema (HCC)  Acute stroke with intraparenchymal hematoma: Patient admitted and underwent TNK.  Later same day had second stroke with left M2 occlusion and underwent thrombectomy. Source of both likely cardioembolic from pseudoaneurysm.  Unfortunately, post-TNK/thrombectomy course complicated by ICH and SAH.   - Heparin was started due to pseudoaneurysm but ICH size increased so this was held for 1 week.  Serial CTs eventually showed  stabilization, transitioned to heparin again, and finally eliquis. Continue Lipitor.    S. epidermidis bacteremia: Completed 2 weeks IV ancef per ID. No valvular or ICD lead vegetations by TEE.     Pseudoaneurysm of left ventricle: Dx 2014 s/p patch repair of rupture w/pericardial tamponade and CABG x1 (WVG > OM1) Oct 2014 by Dr. Dorris Fetch. Stable in Jan 2024 by echo, but patch noted to be dehisced with adherent thrombus on echo this admission. No signs or symptoms of tamponade at this time.   - Continue long term anticoagulation (eliquis) per cardiology. We've discontinued DAPT in light of full dose anticoagulation. - Follow up with cardiothoracic surgery for consideration of repeat repair if he is surgical candidate.     Acute on chronic HFrEF, HTN: Now euvolemic after Tx.  - Continue monitoring volume status.  Sherryll Burger had been held due to soft BP. BP up a bit, will restart entresto at lower dose. Will recheck metabolic profile and if K, Cr stable. - Continue low doses of coreg, lasix 20mg  daily, spironolactone.  - Consider restart SGLT2i as well    Dyslipidemia - Continue lipitor   Dysphagia: PEG placed 7/10.   - Oral intake has been sufficient. Hold bolus tube feeds if pt takes 75% of meal.    Thrombocytopenia: Improved - Recheck for stability today.   Aspiration pneumonia: Tx w/IV antibiotics, resolved.    ICD in place:  Adjustment reaction: - Was started on fluoxetine 10mg .  Azucena Fallen, DO [facility] [email or web address] 11/13/2022, 8:35 AM

## 2022-11-13 NOTE — Plan of Care (Signed)
  Problem: Education: Goal: Knowledge of disease or condition will improve Outcome: Progressing Goal: Knowledge of secondary prevention will improve (MUST DOCUMENT ALL) Outcome: Progressing Goal: Knowledge of patient specific risk factors will improve Elta Guadeloupe N/A or DELETE if not current risk factor) Outcome: Progressing   Problem: Ischemic Stroke/TIA Tissue Perfusion: Goal: Complications of ischemic stroke/TIA will be minimized Outcome: Progressing   Problem: Coping: Goal: Will verbalize positive feelings about self Outcome: Progressing

## 2022-11-14 DIAGNOSIS — I63011 Cerebral infarction due to thrombosis of right vertebral artery: Secondary | ICD-10-CM | POA: Diagnosis not present

## 2022-11-14 LAB — GLUCOSE, CAPILLARY
Glucose-Capillary: 95 mg/dL (ref 70–99)
Glucose-Capillary: 163 mg/dL — ABNORMAL HIGH (ref 70–99)
Glucose-Capillary: 111 mg/dL — ABNORMAL HIGH (ref 70–99)
Glucose-Capillary: 107 mg/dL — ABNORMAL HIGH (ref 70–99)

## 2022-11-14 NOTE — Progress Notes (Signed)
CCC  TOC Expediter f/u  Contacted admissions representative from [facility] at [phone].  Insurance has told facility that pending [name] is holding for patient's medical home, [facility], to authorize SNF and they have 2 weeks to respond.  Discharge order is placed and escalated to [facility] supervisor at campus for further detail work.  PCP is indicated as different provider, determine who the PCP actually is and then request expedited authorization.  Raynelle Highland, MSW, CMSW, CCM

## 2022-11-14 NOTE — Plan of Care (Signed)
  Problem: Nutrition: Goal: Risk of aspiration will decrease Outcome: Progressing Goal: Dietary intake will improve Outcome: Progressing   Problem: Clinical Measurements: Goal: Ability to maintain clinical measurements within normal limits will improve Outcome: Progressing Goal: Will remain free from infection Outcome: Progressing

## 2022-11-14 NOTE — Plan of Care (Signed)
Problem: Education: Goal: Knowledge of disease or condition will improve Outcome: Progressing Goal: Knowledge of secondary prevention will improve (MUST DOCUMENT ALL) Outcome: Progressing Goal: Knowledge of patient specific risk factors will improve [name] N/A or DELETE if not current risk factor) Outcome: Progressing   Problem: Ischemic Stroke/TIA Tissue Perfusion: Goal: Complications of ischemic stroke/TIA will be minimized Outcome: Progressing   Problem: Coping: Goal: Will verbalize positive feelings about self Outcome: Progressing Goal: Will identify appropriate support needs Outcome: Progressing   Problem: Health Behavior/Discharge Planning: Goal: Ability to manage health-related needs will improve Outcome: Progressing Goal: Goals will be collaboratively established with patient/family Outcome: Progressing   Problem: Self-Care: Goal: Ability to participate in self-care as condition permits will improve Outcome: Progressing Goal: Verbalization of feelings and concerns over difficulty with self-care will improve Outcome: Progressing Goal: Ability to communicate needs accurately will improve Outcome: Progressing   Problem: Nutrition: Goal: Risk of aspiration will decrease Outcome: Progressing Goal: Dietary intake will improve Outcome: Progressing   Problem: Education: Goal: Knowledge of General Education information will improve Description: Including pain rating scale, medication(s)/side effects and non-pharmacologic comfort measures Outcome: Progressing   Problem: Health Behavior/Discharge Planning: Goal: Ability to manage health-related needs will improve Outcome: Progressing   Problem: Clinical Measurements: Goal: Ability to maintain clinical measurements within normal limits will improve Outcome: Progressing Goal: Will remain free from infection Outcome: Progressing Goal: Diagnostic test results will improve Outcome: Progressing Goal: Respiratory  complications will improve Outcome: Progressing Goal: Cardiovascular complication will be avoided Outcome: Progressing   Problem: Activity: Goal: Risk for activity intolerance will decrease Outcome: Progressing   Problem: Nutrition: Goal: Adequate nutrition will be maintained Outcome: Progressing   Problem: Coping: Goal: Level of anxiety will decrease Outcome: Progressing   Problem: Elimination: Goal: Will not experience complications related to bowel motility Outcome: Progressing Goal: Will not experience complications related to urinary retention Outcome: Progressing   Problem: Pain Managment: Goal: General experience of comfort will improve Outcome: Progressing   Problem: Safety: Goal: Ability to remain free from injury will improve Outcome: Progressing   Problem: Skin Integrity: Goal: Risk for impaired skin integrity will decrease Outcome: Progressing   Problem: Education: Goal: Understanding of CV disease, CV risk reduction, and recovery process will improve Outcome: Progressing Goal: Individualized Educational Video(s) Outcome: Progressing   Problem: Activity: Goal: Ability to return to baseline activity level will improve Outcome: Progressing   Problem: Cardiovascular: Goal: Ability to achieve and maintain adequate cardiovascular perfusion will improve Outcome: Progressing Goal: Vascular access site(s) Level 0-1 will be maintained Outcome: Progressing   Problem: Health Behavior/Discharge Planning: Goal: Ability to safely manage health-related needs after discharge will improve Outcome: Progressing   Problem: Education: Goal: Ability to describe self-care measures that may prevent or decrease complications (Diabetes Survival Skills Education) will improve Outcome: Progressing Goal: Individualized Educational Video(s) Outcome: Progressing   Problem: Coping: Goal: Ability to adjust to condition or change in health will improve Outcome: Progressing    Problem: Fluid Volume: Goal: Ability to maintain a balanced intake and output will improve Outcome: Progressing   Problem: Health Behavior/Discharge Planning: Goal: Ability to identify and utilize available resources and services will improve Outcome: Progressing Goal: Ability to manage health-related needs will improve Outcome: Progressing   Problem: Metabolic: Goal: Ability to maintain appropriate glucose levels will improve Outcome: Progressing   Problem: Nutritional: Goal: Maintenance of adequate nutrition will improve Outcome: Progressing Goal: Progress toward achieving an optimal weight will improve Outcome: Progressing   Problem: Skin Integrity: Goal: Risk for impaired skin  integrity will decrease Outcome: Progressing   Problem: Tissue Perfusion: Goal: Adequacy of tissue perfusion will improve Outcome: Progressing

## 2022-11-14 NOTE — TOC Progression Note (Signed)
Transition of Care [facility]) - Progression Note    Patient Details  Name: [name] MRN: [id number] Date of Birth: 02-24-1961  Transition of Care [facility]) CM/SW Contact  Baldemar Lenis, [place] Phone Number: 11/14/2022, 3:32 PM  Clinical Narrative:   CSW received call from Admissions at [facility] that authorization is still pending. Patient's [employer] is managed by [facility], and they say they have up to two weeks to make a decision. CSW contacted [facility] Supervisor to ask about any [employer] contact to help expedite [employer] authorization, awaiting response. CSW to follow.    Expected Discharge Plan: Skilled Nursing Facility Barriers to Discharge: Continued Medical Work up, English as a second language teacher, SNF Pending bed offer  Expected Discharge Plan and Services In-house Referral: Clinical Social Work     Living arrangements for the past 2 months: Apartment Expected Discharge Date: 11/10/22                                     Social Determinants of Health (SDOH) Interventions SDOH Screenings   Food Insecurity: Patient Unable To Answer (10/11/2022)  Housing: Patient Unable To Answer (10/11/2022)  Transportation Needs: Patient Unable To Answer (10/11/2022)  Utilities: Patient Unable To Answer (10/11/2022)  Depression (PHQ2-9): Low Risk  (05/08/2022)  Social Connections: Unknown (08/28/2021)   Received from [facility]  Tobacco Use: High Risk (10/29/2022)    Readmission Risk Interventions     No data to display

## 2022-11-14 NOTE — Progress Notes (Signed)
OT Cancellation Note  Patient Details Name: GAILEN LIBERA MRN: [id number] DOB: 1960-10-18   Cancelled Treatment:    Reason Eval/Treat Not Completed: Fatigue/lethargy limiting ability to participate Sleeping x2 OT attempts, did not awaken to voice. Will follow up for OT session likely tomorrow.  Lorre Munroe 11/14/2022, 1:34 PM

## 2022-11-14 NOTE — Progress Notes (Signed)
Robert NOTE    [name]  TDD:[id number] DOB: 29-Nov-1960 DOA: 10/02/2022 PCP: [name], MD   Brief Narrative:  62 y.o. 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  Remains in the hospital.  Hospital course complicated by hemorrhagic conversion of the stroke. Improving now.   During the hospitalization patient underwent an IR thrombectomy but the following day on 6/19 developed frontal IPH and SAH.  CT scan showed progression of hematoma, echocardiogram showed concerns of possible dehiscence of prior patch with adherent LV thrombus.  Hospital course was also complicated by MSSA bacteremia, EP was consulted but did not recommend ICD removal.  Patient also had a PEG tube placed.  Eventually ICH appeared to be stable on repeat CT scan, TEE was negative, antibiotics were stopped and Eliquis was started on 7/17.  Patient remains medically stable for discharge - awaiting safe disposition (SNF) pending insurance authorization.   6/18: Admitted for stroke, received TNK; developed right sided deficits overnight after TNK, underwent NIR thrombectomy 6/19: Post thrombectomy CT showed left frontal IPH and SAH 6/20: Heparin started cautiously 6/22: CT showed progression of hematomas, heparin stopped 6/23: Repeat echo shows dehiscence of prior pseudoaneurysm patch with adherent LV thrombus; Cardiology consulted 6/26: ID consulted for MSSE bacteremia 6/28: EP consulted, recommended not to take out ICD; CT stabilized, heparin restarted 7/2: Transferred [facility] 7/10: Placed PEG tube 7/11: Transitioned to basal bolus feedings and switched to Eliquis 7/13: Increased HA, repeat CT shows progression of ICHs; Eliquis held 7/15: TEE negative, antibiotics stopped 7/17: Eliquis restarted   Significant studies: 6/18 CTA head and  neck: no LVO 6/18 repeat CTA head and neck: new distal left MCA M2 occlusion 6/19 Echo: poor quality images, nondiagnostic 6/19 TCD: positive study, indicating R>L shunt 6/19 LE doppler: no DVT 6/19 CT head: left frontal parietal IPH and adjacent [facility] 6/22 CT head: size now 8x4x4cm, with new 2cm component 6/23 complete echo: now shows recurrence of pseudoaneurysm, dehiscence of patch with adherent thrombus 6/25 coronary CTA: confirms above 6/28 CT head: stable large left frontoparietal hematoma 7/13: CT head shows worsening ICH 7/15 TEE: shows pseudoaneurysm with adherent clot, no valvular vegetations, no signs of vegetation on ICD leads 7/16: CT head shows regression of ICH noted 7/13 7/19: CT head shows no change in ICH   Procedures: 6/18: TNK at 1246 6/19: Thrombectomy by Dr. Tommie Sams 6/19: Cortrak placed 7/10: PEG placed 7/15: TEE 7/26: Seeking SNF placement. Medically stable to discharge.     Assessment & Plan:  Principal Problem:   Stroke (cerebrum) (HCC) Active Problems:   Bacteremia due to Gram-positive bacteria   Pseudoaneurysm of left ventricle of heart   Acute respiratory failure with hypoxia (HCC)   Coronary artery disease   Dysphagia   Dyslipidemia   Essential hypertension   Obesity (BMI 30.0-34.9)   ICD (implantable cardioverter-defibrillator) in place   Acute on chronic systolic CHF (congestive heart failure) (HCC)   Aspiration pneumonia (HCC)   Thrombocytopenia (HCC)   Cerebral edema (HCC)      Acute stroke with intraparenchymal hematoma: Patient admitted and underwent TNK.  Later same day had second stroke with left M2 occlusion and underwent thrombectomy. Source of both likely cardioembolic from pseudoaneurysm. Unfortunately, post-TNK/thrombectomy course complicated by ICH and SAH.   - Heparin was started due  to pseudoaneurysm but ICH size increased so this was held for 1 week.  Serial CTs eventually showed stabilization, transitioned to  heparin again, and finally eliquis. Continue Lipitor.    S. epidermidis bacteremia: Completed 2 weeks IV ancef per ID. No valvular or ICD lead vegetations by TEE.     Pseudoaneurysm of left ventricle: Dx 2014 s/p patch repair of rupture w/pericardial tamponade and CABG x1 (WVG > OM1) Oct 2014 by Dr. Dorris Fetch. Stable in Jan 2024 by echo, but patch noted to be dehisced with adherent thrombus on echo this admission. No signs or symptoms of tamponade at this time.  Now patient is on Eliquis therefore discontinue DAPT - Follow up with cardiothoracic surgery for consideration of repeat repair if he is surgical candidate.     Acute on chronic HFrEF, HTN: Now euvolemic after Tx.  Overall volume status appears to be stable.  Patient is now on cardiac medications including Coreg, Entresto, Aldactone.   Dyslipidemia - Continue lipitor   Dysphagia: PEG placed 7/10.   - Oral intake has been sufficient. Hold bolus tube feeds if pt takes 75% of meal.    Thrombocytopenia: Improved Stable   Aspiration pneumonia: Tx w/IV antibiotics, resolved.    ICD in place:   Adjustment reaction: - Was started on fluoxetine 10mg .    PT/OT-SNF    DVT prophylaxis: Eliquis Code Status: DNR Family Communication:   Status is: Inpatient Medically stable, awaiting placement to SNF       Diet Orders (From admission, onward)     Start     Ordered   11/05/22 1359  DIET DYS 3 Room service appropriate? Yes with Assist; Fluid consistency: Thin  Diet effective now       Question Answer Comment  Room service appropriate? Yes with Assist   Fluid consistency: Thin      11/05/22 1359            Subjective:  No complaints.   Examination:  General exam: Appears calm and comfortable  Respiratory system: Clear to auscultation. Respiratory effort normal. Cardiovascular system: S1 & S2 heard, RRR. No JVD, murmurs, rubs, gallops or clicks. No pedal edema. Gastrointestinal system: Abdomen is nondistended,  soft and nontender. No organomegaly or masses felt. Normal bowel sounds heard. Central nervous system: Alert and oriented. No focal neurological deficits. Extremities: Symmetric 5 x 5 power. Skin: No rashes, lesions or ulcers Psychiatry: Judgement and insight appear normal. Mood & affect appropriate.  Objective: Vitals:   11/13/22 2359 11/14/22 0404 11/14/22 0500 11/14/22 0824  BP: (!) 103/56 117/75  104/80  Pulse: 76 88  91  Resp: 18 18  20   Temp: 99.4 F (37.4 C) 98.5 F (36.9 C)  98.6 F (37 C)  TempSrc: Oral Oral  Oral  SpO2: 100% 100%  99%  Weight:   100.1 kg   Height:        Intake/Output Summary (Last 24 hours) at 11/14/2022 0925 Last data filed at 11/14/2022 0700 Gross per 24 hour  Intake 540 ml  Output 1400 ml  Net -860 ml   Filed Weights   11/12/22 0608 11/13/22 0707 11/14/22 0500  Weight: 97.6 kg 100 kg 100.1 kg    Scheduled Meds:  apixaban  5 mg Oral BID   atorvastatin  80 mg Oral Daily   carvedilol  3.125 mg Oral BID WC   famotidine  20 mg Oral BID   feeding supplement (JEVITY 1.5 CAL/FIBER)  237 mL Per Tube TID PC & HS  fiber supplement (BANATROL TF)  60 mL Per Tube BID   FLUoxetine  10 mg Oral Daily   free water  60 mL Per Tube TID PC & HS   gabapentin  300 mg Oral QHS   Gerhardt's butt cream   Topical BID   insulin aspart  0-5 Units Subcutaneous QHS   insulin aspart  0-6 Units Subcutaneous TID WC   multivitamin with minerals  1 tablet Oral Daily   nicotine  7 mg Transdermal Daily   nystatin   Topical TID   polyethylene glycol  17 g Oral BID   sacubitril-valsartan  1 tablet Oral BID   spironolactone  12.5 mg Oral Daily   Continuous Infusions:  sodium chloride 10 mL/hr at 10/22/22 1331    Nutritional status Signs/Symptoms: NPO status Interventions: MVI, Tube feeding Body mass index is 29.12 kg/m.  Data Reviewed:   CBC: Recent Labs  Lab 11/10/22 1119  WBC 5.5  HGB 12.3*  HCT 36.4*  MCV 89.2  PLT 183   Basic Metabolic  Panel: Recent Labs  Lab 11/10/22 1119  NA 134*  K 4.3  CL 98  CO2 24  GLUCOSE 126*  BUN 12  CREATININE 0.78  CALCIUM 9.4   GFR: Estimated Creatinine Clearance: 120.7 mL/min (by C-G formula based on SCr of 0.78 mg/dL). Liver Function Tests: Recent Labs  Lab 11/10/22 1119  AST 22  ALT 30  ALKPHOS 121  BILITOT 0.4  PROT 6.9  ALBUMIN 3.3*   No results for input(s): "LIPASE", "AMYLASE" in the last 168 hours. No results for input(s): "AMMONIA" in the last 168 hours. Coagulation Profile: No results for input(s): "INR", "PROTIME" in the last 168 hours. Cardiac Enzymes: No results for input(s): "CKTOTAL", "CKMB", "CKMBINDEX", "TROPONINI" in the last 168 hours. BNP (last 3 results) No results for input(s): "PROBNP" in the last 8760 hours. HbA1C: No results for input(s): "HGBA1C" in the last 72 hours. CBG: Recent Labs  Lab 11/13/22 0618 11/13/22 1246 11/13/22 1644 11/13/22 2117 11/14/22 0633  GLUCAP 87 147* 102* 131* 95   Lipid Profile: No results for input(s): "CHOL", "HDL", "LDLCALC", "TRIG", "CHOLHDL", "LDLDIRECT" in the last 72 hours. Thyroid Function Tests: No results for input(s): "TSH", "T4TOTAL", "FREET4", "T3FREE", "THYROIDAB" in the last 72 hours. Anemia Panel: No results for input(s): "VITAMINB12", "FOLATE", "FERRITIN", "TIBC", "IRON", "RETICCTPCT" in the last 72 hours. Sepsis Labs: No results for input(s): "PROCALCITON", "LATICACIDVEN" in the last 168 hours.  No results found for this or any previous visit (from the past 240 hour(s)).       Radiology Studies: No results found.         LOS: 43 days   Time spent= 35 mins    Charise Leinbach Joline Maxcy, MD [facility]  If 7PM-7AM, please contact night-coverage  11/14/2022, 9:25 AM

## 2022-11-15 DIAGNOSIS — I6300 Cerebral infarction due to thrombosis of unspecified precerebral artery: Secondary | ICD-10-CM | POA: Diagnosis not present

## 2022-11-15 LAB — GLUCOSE, CAPILLARY
Glucose-Capillary: 94 mg/dL (ref 70–99)
Glucose-Capillary: 117 mg/dL — ABNORMAL HIGH (ref 70–99)
Glucose-Capillary: 117 mg/dL — ABNORMAL HIGH (ref 70–99)
Glucose-Capillary: 119 mg/dL — ABNORMAL HIGH (ref 70–99)

## 2022-11-15 MED ORDER — POLYETHYLENE GLYCOL 3350 17 G PO PACK: 17.0000 g | PACK | Freq: Two times a day (BID) | ORAL | Status: AC | PRN

## 2022-11-15 MED ORDER — CARVEDILOL 3.125 MG PO TABS: 3.1250 mg | ORAL_TABLET | Freq: Two times a day (BID) | ORAL | Status: AC

## 2022-11-15 MED ORDER — FAMOTIDINE 20 MG PO TABS: 20.0000 mg | ORAL_TABLET | Freq: Two times a day (BID) | ORAL | Status: AC

## 2022-11-15 MED ORDER — GABAPENTIN 250 MG/5ML PO SOLN: 300.0000 mg | Freq: Every day | ORAL | Status: AC

## 2022-11-15 MED ORDER — CYCLOBENZAPRINE HCL 5 MG PO TABS: 5.0000 mg | ORAL_TABLET | Freq: Three times a day (TID) | ORAL | 0 refills | Status: AC | PRN

## 2022-11-15 MED ORDER — FLUOXETINE HCL 10 MG PO CAPS: 10.0000 mg | ORAL_CAPSULE | Freq: Every day | ORAL | Status: AC

## 2022-11-15 MED ORDER — APIXABAN 5 MG PO TABS: 5.0000 mg | ORAL_TABLET | Freq: Two times a day (BID) | ORAL | Status: AC

## 2022-11-15 MED ORDER — SACUBITRIL-VALSARTAN 24-26 MG PO TABS: 1.0000 | ORAL_TABLET | Freq: Two times a day (BID) | ORAL | Status: AC

## 2022-11-15 MED ORDER — BANATROL TF EN LIQD: 60.0000 mL | Freq: Two times a day (BID) | ENTERAL | Status: AC

## 2022-11-15 NOTE — Progress Notes (Signed)
Speech Language Pathology Treatment: Cognitive-Linquistic  Patient Details Name: [name] MRN: [id number] DOB: [birth year] Today's Date: 11/15/2022 Time: 6962-9528 SLP Time Calculation (min) (ACUTE ONLY): 38 min  Assessment / Plan / Recommendation Clinical Impression  Pt reporting significant discomfort in BLE, stating the bed is "too short". SLP and NT attempted repositioning pt with pt then reporting pain from his stomach with reclining or movement. Pt requesting pain medication and RN notified. Pt attempting long sentences about concerns with being understood with new staff once discharged to SNF. He appears drowsy today, which appears to further affect his intelligibility. SLP provided and reviewed education handouts for pt regarding aphasia, apraxia, and dysarthria and called significant other, [name], to reinforce. Attempted phonemes /b/ and /k/ and single syllable words in front of a mirror. Pt consistently producing target sound at the phoneme level but with increased inconsistency at the syllable level even when given Max verbal, visual, and tactile cueing. Pt presents with significant deficits in reading and writing and greatly benefits from visuals. Pt shows great potential to improve with continued ST intervention targeting automatic speech, apraxia, aug com, and speech intelligibility. Will continue to f/u as able.    HPI HPI: Pt is a 62 y/o male who presented 6/19 with L weakness and numbness. Pt received TNK then developed new symptoms of aphasia and R weakness. He underwent mechanical thrombectomy L M3 segment MCA 6/19. Pt noted to have small hemorrhage and follow up CT showed progression of hemorrhage 3cm then 7cm and small acute R cerebellar infarct. PEG placed 7/10. PMH: CAD s/p PCI w/stent and 1v CABG, multiple CGAs, systolic HF s/p ICD, HLD, HTN.      SLP Plan  Continue with current plan of care      Recommendations for follow up therapy are one component of a  multi-disciplinary discharge planning process, led by the attending physician.  Recommendations may be updated based on patient status, additional functional criteria and insurance authorization.    Recommendations                     Oral care BID   Frequent or constant Supervision/Assistance Aphasia (R47.01);Cognitive communication deficit (R41.841);Dysarthria and anarthria (R47.1);Apraxia (R48.2)     Continue with current plan of care     [name], M.A., CF-SLP Speech Language Pathology, Acute Rehabilitation Services  Secure Chat preferred [phone]   11/15/2022, 10:34 AM

## 2022-11-15 NOTE — Plan of Care (Signed)
  Problem: Education: Goal: Knowledge of disease or condition will improve Outcome: Progressing Goal: Knowledge of secondary prevention will improve (MUST DOCUMENT ALL) Outcome: Progressing Goal: Knowledge of patient specific risk factors will improve [name] N/A or DELETE if not current risk factor) Outcome: Progressing   Problem: Ischemic Stroke/TIA Tissue Perfusion: Goal: Complications of ischemic stroke/TIA will be minimized Outcome: Progressing   Problem: Coping: Goal: Will verbalize positive feelings about self Outcome: Progressing Goal: Will identify appropriate support needs Outcome: Progressing   Problem: Health Behavior/Discharge Planning: Goal: Ability to manage health-related needs will improve Outcome: Progressing Goal: Goals will be collaboratively established with patient/family Outcome: Progressing   Problem: Self-Care: Goal: Ability to participate in self-care as condition permits will improve Outcome: Progressing Goal: Verbalization of feelings and concerns over difficulty with self-care will improve Outcome: Progressing Goal: Ability to communicate needs accurately will improve Outcome: Progressing   Problem: Nutrition: Goal: Risk of aspiration will decrease Outcome: Progressing Goal: Dietary intake will improve Outcome: Progressing   Problem: Education: Goal: Knowledge of General Education information will improve Description: Including pain rating scale, medication(s)/side effects and non-pharmacologic comfort measures Outcome: Progressing   Problem: Health Behavior/Discharge Planning: Goal: Ability to manage health-related needs will improve Outcome: Progressing   Problem: Clinical Measurements: Goal: Ability to maintain clinical measurements within normal limits will improve Outcome: Progressing Goal: Will remain free from infection Outcome: Progressing Goal: Diagnostic test results will improve Outcome: Progressing Goal: Respiratory  complications will improve Outcome: Progressing Goal: Cardiovascular complication will be avoided Outcome: Progressing   Problem: Activity: Goal: Risk for activity intolerance will decrease Outcome: Progressing   Problem: Nutrition: Goal: Adequate nutrition will be maintained Outcome: Progressing   Problem: Coping: Goal: Level of anxiety will decrease Outcome: Progressing   Problem: Elimination: Goal: Will not experience complications related to bowel motility Outcome: Progressing Goal: Will not experience complications related to urinary retention Outcome: Progressing

## 2022-11-15 NOTE — Progress Notes (Signed)
Physical Therapy Treatment Patient Details Name: [name] MRN: [id number] DOB: May 16, 1960 Today's Date: 11/15/2022   History of Present Illness Patient is a 62 y/o male admitted 10/03/22 with L weakness and numbness.  Received TNK then developed now symptoms of aphasia and R weakness.  He underwent mechanical thrombectomy L M3 segment MCA noted to have small hemorrhage and follow up CT showed progression of hemorrhage 3cm then 7cm and small acute R cerebellar infarct. PEG placed 7/10. CT on 7/13 showed slight diminished size of largest left frontal parietal lobe hematoma, but increased size of left periventricular hematoma, and several small new acute appearing hemorrhages posterior to this, and also in the right occipital lobe, left occipital lobe, left cerebellum and some hemorrhage layering in the bilateral occipital horns PMH positive for CAD s/p PCI w/stent and 1v CABG, multiple CGAs, systolic HF s/p ICD, HLD, HTN.    PT Comments  Pt progressing towards physical therapy goals. Was able to participate in bed level exercise this session. Noted continued motor planning difficulties and spontaneous movement of RLE when attempting to initiate movement of LLE. Pt asking for therapist to review handouts from speech therapy session. RN present and offered to review with patient during dinner while she was assisting him to eat. Continue to recommend multidisciplinary therapies at d/c to maximize functional independence, safety, and return of function.    If plan is discharge home, recommend the following: Two people to help with walking and/or transfers;A lot of help with bathing/dressing/bathroom;Assistance with cooking/housework;Direct supervision/assist for medications management;Assist for transportation;Help with stairs or ramp for entrance;Assistance with feeding   Can travel by private vehicle     No  Equipment Recommendations   (TBD at next venue)    Recommendations for Other Services  Rehab consult     Precautions / Restrictions Precautions Precautions: Fall Precaution Comments: PEG, abdominal binder Restrictions Weight Bearing Restrictions: No     Mobility  Bed Mobility Overal bed mobility: Needs Assistance Bed Mobility: Rolling Rolling: Min assist (Mod to the L, min to the R)         General bed mobility comments: Pt able to reach across with LUE for R railing without assist. Min assist provided for full roll.    Transfers                        Ambulation/Gait                   Stairs             Wheelchair Mobility     Tilt Bed    Modified Rankin (Stroke Patients Only) Modified Rankin (Stroke Patients Only) Pre-Morbid Rankin Score: Moderate disability Modified Rankin: Severe disability     Balance Overall balance assessment: Needs assistance Sitting-balance support: Single extremity supported, Feet supported Sitting balance-Leahy Scale: Fair Sitting balance - Comments: min guard overall but w/ increasing pain, pt would push back requiring Mod A to correct/maintain balance. Postural control: Posterior lean                                  Cognition Arousal/Alertness: Awake/alert Behavior During Therapy: WFL for tasks assessed/performed Overall Cognitive Status: Difficult to assess Area of Impairment: Attention, Safety/judgement, Awareness, Following commands, Problem solving                   Current Attention Level: Selective Memory: Decreased  short-term memory Following Commands: Follows one step commands with increased time, Follows one step commands inconsistently Safety/Judgement: Decreased awareness of deficits, Decreased awareness of safety Awareness: Emergent Problem Solving: Slow processing, Requires verbal cues, Requires tactile cues General Comments: speech with continued improvements, able to understand at least 90% of pt speech/needs expression. follows directions  inconsistently d/t apraxia, impaired motor planning though improving with > 60% motor planning command following        Exercises General Exercises - Lower Extremity Heel Slides: AAROM, Both, 10 reps Hip ABduction/ADduction: AAROM, Strengthening, Both, 10 reps, Supine Other Exercises Other Exercises: AAROM R UE chest press x10    General Comments        Pertinent Vitals/Pain Pain Assessment Pain Assessment: Faces Faces Pain Scale: Hurts little more Pain Location: stomach when reclined/moved Pain Descriptors / Indicators: Grimacing, Guarding, Moaning Pain Intervention(s): Limited activity within patient's tolerance, Monitored during session, Repositioned    Home Living                          Prior Function            PT Goals (current goals can now be found in the care plan section) Acute Rehab PT Goals Patient Stated Goal: Decrease pain PT Goal Formulation: Patient unable to participate in goal setting Time For Goal Achievement: 11/29/22 Potential to Achieve Goals: Fair    Frequency    Min 1X/week      PT Plan Current plan remains appropriate    Co-evaluation              AM-PAC PT "6 Clicks" Mobility   Outcome Measure  Help needed turning from your back to your side while in a flat bed without using bedrails?: A Lot Help needed moving from lying on your back to sitting on the side of a flat bed without using bedrails?: Total Help needed moving to and from a bed to a chair (including a wheelchair)?: Total Help needed standing up from a chair using your arms (e.g., wheelchair or bedside chair)?: Total Help needed to walk in hospital room?: Total Help needed climbing 3-5 steps with a railing? : Total 6 Click Score: 7    End of Session Equipment Utilized During Treatment: Gait belt Activity Tolerance: Patient tolerated treatment well Patient left: in bed;with call bell/phone within reach;with bed alarm set Nurse Communication: Mobility  status;Need for lift equipment PT Visit Diagnosis: Other abnormalities of gait and mobility (R26.89);Other symptoms and signs involving the nervous system (R29.898);Hemiplegia and hemiparesis Hemiplegia - Right/Left: Right Hemiplegia - dominant/non-dominant: Dominant Hemiplegia - caused by: Cerebral infarction;Other Nontraumatic intracranial hemorrhage     Time: 7829-5621 PT Time Calculation (min) (ACUTE ONLY): 22 min  Charges:    $Therapeutic Activity: 8-22 mins PT General Charges $$ ACUTE PT VISIT: 1 Visit                     Conni Slipper, PT, DPT Acute Rehabilitation Services Secure Chat Preferred Office: [phone]    Marylynn Pearson 11/15/2022, 3:35 PM

## 2022-11-15 NOTE — Plan of Care (Signed)
Problem: Education: Goal: Knowledge of disease or condition will improve Outcome: Progressing Goal: Knowledge of secondary prevention will improve (MUST DOCUMENT ALL) Outcome: Progressing Goal: Knowledge of patient specific risk factors will improve [name] N/A or DELETE if not current risk factor) Outcome: Progressing   Problem: Ischemic Stroke/TIA Tissue Perfusion: Goal: Complications of ischemic stroke/TIA will be minimized Outcome: Progressing   Problem: Coping: Goal: Will verbalize positive feelings about self Outcome: Progressing Goal: Will identify appropriate support needs Outcome: Progressing   Problem: Health Behavior/Discharge Planning: Goal: Ability to manage health-related needs will improve Outcome: Progressing Goal: Goals will be collaboratively established with patient/family Outcome: Progressing   Problem: Self-Care: Goal: Ability to participate in self-care as condition permits will improve Outcome: Progressing Goal: Verbalization of feelings and concerns over difficulty with self-care will improve Outcome: Progressing Goal: Ability to communicate needs accurately will improve Outcome: Progressing   Problem: Nutrition: Goal: Risk of aspiration will decrease Outcome: Progressing Goal: Dietary intake will improve Outcome: Progressing   Problem: Education: Goal: Knowledge of General Education information will improve Description: Including pain rating scale, medication(s)/side effects and non-pharmacologic comfort measures Outcome: Progressing   Problem: Health Behavior/Discharge Planning: Goal: Ability to manage health-related needs will improve Outcome: Progressing   Problem: Clinical Measurements: Goal: Ability to maintain clinical measurements within normal limits will improve Outcome: Progressing Goal: Will remain free from infection Outcome: Progressing Goal: Diagnostic test results will improve Outcome: Progressing Goal: Respiratory  complications will improve Outcome: Progressing Goal: Cardiovascular complication will be avoided Outcome: Progressing   Problem: Activity: Goal: Risk for activity intolerance will decrease Outcome: Progressing   Problem: Nutrition: Goal: Adequate nutrition will be maintained Outcome: Progressing   Problem: Coping: Goal: Level of anxiety will decrease Outcome: Progressing   Problem: Elimination: Goal: Will not experience complications related to bowel motility Outcome: Progressing Goal: Will not experience complications related to urinary retention Outcome: Progressing   Problem: Pain Managment: Goal: General experience of comfort will improve Outcome: Progressing   Problem: Safety: Goal: Ability to remain free from injury will improve Outcome: Progressing   Problem: Skin Integrity: Goal: Risk for impaired skin integrity will decrease Outcome: Progressing   Problem: Education: Goal: Understanding of CV disease, CV risk reduction, and recovery process will improve Outcome: Progressing Goal: Individualized Educational Video(s) Outcome: Progressing   Problem: Activity: Goal: Ability to return to baseline activity level will improve Outcome: Progressing   Problem: Cardiovascular: Goal: Ability to achieve and maintain adequate cardiovascular perfusion will improve Outcome: Progressing Goal: Vascular access site(s) Level 0-1 will be maintained Outcome: Progressing   Problem: Health Behavior/Discharge Planning: Goal: Ability to safely manage health-related needs after discharge will improve Outcome: Progressing   Problem: Education: Goal: Ability to describe self-care measures that may prevent or decrease complications (Diabetes Survival Skills Education) will improve Outcome: Progressing Goal: Individualized Educational Video(s) Outcome: Progressing   Problem: Coping: Goal: Ability to adjust to condition or change in health will improve Outcome: Progressing    Problem: Fluid Volume: Goal: Ability to maintain a balanced intake and output will improve Outcome: Progressing   Problem: Health Behavior/Discharge Planning: Goal: Ability to identify and utilize available resources and services will improve Outcome: Progressing Goal: Ability to manage health-related needs will improve Outcome: Progressing   Problem: Metabolic: Goal: Ability to maintain appropriate glucose levels will improve Outcome: Progressing   Problem: Nutritional: Goal: Maintenance of adequate nutrition will improve Outcome: Progressing Goal: Progress toward achieving an optimal weight will improve Outcome: Progressing   Problem: Skin Integrity: Goal: Risk for impaired skin  integrity will decrease Outcome: Progressing   Problem: Tissue Perfusion: Goal: Adequacy of tissue perfusion will improve Outcome: Progressing

## 2022-11-15 NOTE — Progress Notes (Signed)
PROGRESS NOTE    [name]  QIO:[id number] DOB: [birth year] DOA: 10/02/2022 PCP: [name], MD   Brief Narrative:  62 y.o. 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  Remains in the hospital.  Hospital course complicated by hemorrhagic conversion of the stroke. Improving now.   During the hospitalization patient underwent an IR thrombectomy but the following day on 6/19 developed frontal IPH and SAH.  CT scan showed progression of hematoma, echocardiogram showed concerns of possible dehiscence of prior patch with adherent LV thrombus.  Hospital course was also complicated by MSSA bacteremia, EP was consulted but did not recommend ICD removal.  Patient also had a PEG tube placed.  Eventually ICH appeared to be stable on repeat CT scan, TEE was negative, antibiotics were stopped and Eliquis was started on 7/17.  Patient remains medically stable for discharge - awaiting safe disposition (SNF) pending insurance authorization.   6/18: Admitted for stroke, received TNK; developed right sided deficits overnight after TNK, underwent NIR thrombectomy 6/19: Post thrombectomy CT showed left frontal IPH and SAH 6/20: Heparin started cautiously 6/22: CT showed progression of hematomas, heparin stopped 6/23: Repeat echo shows dehiscence of prior pseudoaneurysm patch with adherent LV thrombus; Cardiology consulted 6/26: ID consulted for MSSE bacteremia 6/28: EP consulted, recommended not to take out ICD; CT stabilized, heparin restarted 7/2: Transferred [facility] 7/10: Placed PEG tube 7/11: Transitioned to basal bolus feedings and switched to Eliquis 7/13: Increased HA, repeat CT shows progression of ICHs; Eliquis held 7/15: TEE negative, antibiotics stopped 7/17: Eliquis restarted   Significant studies: 6/18 CTA head and  neck: no LVO 6/18 repeat CTA head and neck: new distal left MCA M2 occlusion 6/19 Echo: poor quality images, nondiagnostic 6/19 TCD: positive study, indicating R>L shunt 6/19 LE doppler: no DVT 6/19 CT head: left frontal parietal IPH and adjacent [facility] 6/22 CT head: size now 8x4x4cm, with new 2cm component 6/23 complete echo: now shows recurrence of pseudoaneurysm, dehiscence of patch with adherent thrombus 6/25 coronary CTA: confirms above 6/28 CT head: stable large left frontoparietal hematoma 7/13: CT head shows worsening ICH 7/15 TEE: shows pseudoaneurysm with adherent clot, no valvular vegetations, no signs of vegetation on ICD leads 7/16: CT head shows regression of ICH noted 7/13 7/19: CT head shows no change in ICH   Procedures: 6/18: TNK at 1246 6/19: Thrombectomy by Dr. Tommie Sams 6/19: Cortrak placed 7/10: PEG placed 7/15: TEE 7/26: Seeking SNF placement. Medically stable to discharge.     Assessment & Plan:  Principal Problem:   Stroke (cerebrum) (HCC) Active Problems:   Bacteremia due to Gram-positive bacteria   Pseudoaneurysm of left ventricle of heart   Acute respiratory failure with hypoxia (HCC)   Coronary artery disease   Dysphagia   Dyslipidemia   Essential hypertension   Obesity (BMI 30.0-34.9)   ICD (implantable cardioverter-defibrillator) in place   Acute on chronic systolic CHF (congestive heart failure) (HCC)   Aspiration pneumonia (HCC)   Thrombocytopenia (HCC)   Cerebral edema (HCC)      Acute stroke with intraparenchymal hematoma: Patient admitted and underwent TNK.  Later same day had second stroke with left M2 occlusion and underwent thrombectomy. Source of both likely cardioembolic from pseudoaneurysm. Unfortunately, post-TNK/thrombectomy course complicated by ICH and SAH.   - Heparin was started due  to pseudoaneurysm but ICH size increased so this was held for 1 week.  Serial CTs eventually showed stabilization, transitioned to  heparin again, and finally eliquis. Continue Lipitor.    S. epidermidis bacteremia: Completed 2 weeks IV ancef per ID. No valvular or ICD lead vegetations by TEE.     Pseudoaneurysm of left ventricle: Dx 2014 s/p patch repair of rupture w/pericardial tamponade and CABG x1 (WVG > OM1) Oct 2014 by Dr. Dorris Fetch. Stable in Jan 2024 by echo, but patch noted to be dehisced with adherent thrombus on echo this admission. No signs or symptoms of tamponade at this time.  Now patient is on Eliquis therefore discontinue DAPT - Follow up with cardiothoracic surgery for consideration of repeat repair if he is surgical candidate.     Acute on chronic HFrEF, HTN: Now euvolemic after Tx.  Overall volume status appears to be stable.  Patient is now on cardiac medications including Coreg, Entresto, Aldactone.   Dyslipidemia - Continue lipitor   Dysphagia: PEG placed 7/10.   - Oral intake has been sufficient. Hold bolus tube feeds if pt takes 75% of meal.    Thrombocytopenia: Improved Stable   Aspiration pneumonia: Tx w/IV antibiotics, resolved.    ICD in place:   Adjustment reaction: - Was started on fluoxetine 10mg .    PT/OT-SNF Patient will highly benefit from skilled nursing facility at the time of discharge   DVT prophylaxis: Eliquis Code Status: DNR Family Communication:   Status is: Inpatient Medically stable, awaiting placement to SNF Insurance requested peer to peer therefore I call them.  I have not scheduled a call back from them later today      Diet Orders (From admission, onward)     Start     Ordered   11/05/22 1359  DIET DYS 3 Room service appropriate? Yes with Assist; Fluid consistency: Thin  Diet effective now       Question Answer Comment  Room service appropriate? Yes with Assist   Fluid consistency: Thin      11/05/22 1359            Subjective:  Patient has no new complaints   Examination:  General exam: Appears calm and comfortable  Respiratory  system: Clear to auscultation. Respiratory effort normal. Cardiovascular system: S1 & S2 heard, RRR. No JVD, murmurs, rubs, gallops or clicks. No pedal edema. Gastrointestinal system: Abdomen is nondistended, soft and nontender. No organomegaly or masses felt. Normal bowel sounds heard. Central nervous system: Alert and oriented.  Some aphasia but mostly apraxia Extremities: Symmetric 5 x 5 power. Skin: No rashes, lesions or ulcers Psychiatry: Judgement and insight appear normal. Mood & affect appropriate.  Objective: Vitals:   11/14/22 2330 11/15/22 0437 11/15/22 0840 11/15/22 1115  BP: 101/74 96/77 99/79  108/72  Pulse: 86 78 87 85  Resp:  16 18 16   Temp: 98.2 F (36.8 C) 98 F (36.7 C) 99 F (37.2 C) 98.6 F (37 C)  TempSrc: Axillary Oral Oral Oral  SpO2: 99% 100% 97% 99%  Weight:      Height:        Intake/Output Summary (Last 24 hours) at 11/15/2022 1154 Last data filed at 11/15/2022 0808 Gross per 24 hour  Intake 555 ml  Output 1150 ml  Net -595 ml   Filed Weights   11/12/22 0608 11/13/22 0707 11/14/22 0500  Weight: 97.6 kg 100 kg 100.1 kg    Scheduled Meds:  apixaban  5 mg Oral BID   atorvastatin  80  mg Oral Daily   carvedilol  3.125 mg Oral BID WC   famotidine  20 mg Oral BID   feeding supplement (JEVITY 1.5 CAL/FIBER)  237 mL Per Tube TID PC & HS   fiber supplement (BANATROL TF)  60 mL Per Tube BID   FLUoxetine  10 mg Oral Daily   free water  60 mL Per Tube TID PC & HS   gabapentin  300 mg Oral QHS   Gerhardt's butt cream   Topical BID   insulin aspart  0-5 Units Subcutaneous QHS   insulin aspart  0-6 Units Subcutaneous TID WC   multivitamin with minerals  1 tablet Oral Daily   nicotine  7 mg Transdermal Daily   nystatin   Topical TID   polyethylene glycol  17 g Oral BID   sacubitril-valsartan  1 tablet Oral BID   spironolactone  12.5 mg Oral Daily   Continuous Infusions:  sodium chloride 10 mL/hr at 10/22/22 1331    Nutritional status Signs/Symptoms:  NPO status Interventions: MVI, Tube feeding Body mass index is 29.12 kg/m.  Data Reviewed:   CBC: Recent Labs  Lab 11/10/22 1119  WBC 5.5  HGB 12.3*  HCT 36.4*  MCV 89.2  PLT 183   Basic Metabolic Panel: Recent Labs  Lab 11/10/22 1119  NA 134*  K 4.3  CL 98  CO2 24  GLUCOSE 126*  BUN 12  CREATININE 0.78  CALCIUM 9.4   GFR: Estimated Creatinine Clearance: 120.7 mL/min (by C-G formula based on SCr of 0.78 mg/dL). Liver Function Tests: Recent Labs  Lab 11/10/22 1119  AST 22  ALT 30  ALKPHOS 121  BILITOT 0.4  PROT 6.9  ALBUMIN 3.3*   No results for input(s): "LIPASE", "AMYLASE" in the last 168 hours. No results for input(s): "AMMONIA" in the last 168 hours. Coagulation Profile: No results for input(s): "INR", "PROTIME" in the last 168 hours. Cardiac Enzymes: No results for input(s): "CKTOTAL", "CKMB", "CKMBINDEX", "TROPONINI" in the last 168 hours. BNP (last 3 results) No results for input(s): "PROBNP" in the last 8760 hours. HbA1C: No results for input(s): "HGBA1C" in the last 72 hours. CBG: Recent Labs  Lab 11/14/22 1144 11/14/22 1646 11/14/22 2115 11/15/22 0622 11/15/22 1115  GLUCAP 163* 111* 107* 94 117*   Lipid Profile: No results for input(s): "CHOL", "HDL", "LDLCALC", "TRIG", "CHOLHDL", "LDLDIRECT" in the last 72 hours. Thyroid Function Tests: No results for input(s): "TSH", "T4TOTAL", "FREET4", "T3FREE", "THYROIDAB" in the last 72 hours. Anemia Panel: No results for input(s): "VITAMINB12", "FOLATE", "FERRITIN", "TIBC", "IRON", "RETICCTPCT" in the last 72 hours. Sepsis Labs: No results for input(s): "PROCALCITON", "LATICACIDVEN" in the last 168 hours.  No results found for this or any previous visit (from the past 240 hour(s)).       Radiology Studies: No results found.         LOS: 44 days   Time spent= 35 mins    Wrigley Winborne Joline Maxcy, MD [facility]  If 7PM-7AM, please contact night-coverage  11/15/2022, 11:54 AM

## 2022-11-15 NOTE — TOC Progression Note (Addendum)
Transition of Care [facility]) - Progression Note    Patient Details  Name: [name] MRN: [id number] Date of Birth: 1960/05/10  [facility] CM/SW Contact  Dellie Burns [place], [place] Phone Number: 11/15/2022, 10:06 AM  Clinical Narrative:  [facility] is offering a peer-to-peer review for SNF auth which can be completed by calling [phone] before 1pm today in order to schedule a time for review. Reference number for P2P is [id number]. If peer-to-peer not completed today, [employer] will likely issue a denial for SNF coverage.   MD updated.   UPDATE 1345: per attending MD, peer-to-peer complete with [facility] medical director Dr. Corky Downs and pt was approved for SNF pending updated PT note today showing pt is participating with therapy. PT aware of request for pt to be seen today. Confirmed with Alvino Chapel at [facility] they are prepared to admit pt once auth details available.   UPDATE 1545: PT note available. Spoke to Mya at [facility] who reports pt is showing tentatively approved pending therapy notes. PT/OT notes from today faxed to number provided [phone]. Per Mya, pt should be officially approved by tomorrow AM.   Dellie Burns, MSW, LCSW [phone] (coverage)    Dellie Burns, MSW, LCSW [phone] (coverage)       Expected Discharge Plan: Skilled Nursing Facility Barriers to Discharge: Continued Medical Work up, English as a second language teacher, SNF Pending bed offer  Expected Discharge Plan and Services In-house Referral: Clinical Social Work     Living arrangements for the past 2 months: Apartment Expected Discharge Date: 11/10/22                                     Social Determinants of Health (SDOH) Interventions SDOH Screenings   Food Insecurity: Patient Unable To Answer (10/11/2022)  Housing: Patient Unable To Answer (10/11/2022)  Transportation Needs: Patient Unable To Answer (10/11/2022)  Utilities: Patient Unable To Answer (10/11/2022)  Depression  (PHQ2-9): Low Risk  (05/08/2022)  Social Connections: Unknown (08/28/2021)   Received from [facility]  Tobacco Use: High Risk (10/29/2022)    Readmission Risk Interventions     No data to display

## 2022-11-15 NOTE — Progress Notes (Signed)
Occupational Therapy Treatment Patient Details Name: [name] MRN: [id number] DOB: 26-Jun-1960 Today's Date: 11/15/2022   History of present illness Patient is a 62 y/o male admitted 10/03/22 with L weakness and numbness.  Received TNK then developed now symptoms of aphasia and R weakness.  He underwent mechanical thrombectomy L M3 segment MCA noted to have small hemorrhage and follow up CT showed progression of hemorrhage 3cm then 7cm and small acute R cerebellar infarct. PEG placed 7/10. CT on 7/13 showed slight diminished size of largest left frontal parietal lobe hematoma, but increased size of left periventricular hematoma, and several small new acute appearing hemorrhages posterior to this, and also in the right occipital lobe, left occipital lobe, left cerebellum and some hemorrhage layering in the bilateral occipital horns PMH positive for CAD s/p PCI w/stent and 1v CABG, multiple CGAs, systolic HF s/p ICD, HLD, HTN.   OT comments  Pt with continued improvements in speech with > 90% of speech appropriate in session. Focused on self feeding with use of AE w/ pt progressing to Min A for feeding tasks. Pt requiring assist only to collect food on utensil using built up grips and hand over hand assist when attempting to use R UE to grasp cups. Pt with continued apraxia with L UE often attempting to complete tasks that RUE was cued to complete. Pain and flexibility in R UE also appears improved today.    Recommendations for follow up therapy are one component of a multi-disciplinary discharge planning process, led by the attending physician.  Recommendations may be updated based on patient status, additional functional criteria and insurance authorization.    Assistance Recommended at Discharge Frequent or constant Supervision/Assistance  Patient can return home with the following  Two people to help with walking and/or transfers;Assistance with cooking/housework;Assistance with feeding;Help  with stairs or ramp for entrance;Direct supervision/assist for financial management;Assist for transportation;Direct supervision/assist for medications management;A lot of help with bathing/dressing/bathroom   Equipment Recommendations  Wheelchair cushion (measurements OT);Wheelchair (measurements OT);Other (comment) (hoyer lift)    Recommendations for Other Services      Precautions / Restrictions Precautions Precautions: Fall;Other (comment) Precaution Comments: PEG, abdominal binder Restrictions Weight Bearing Restrictions: No       Mobility Bed Mobility                    Transfers                         Balance                                           ADL either performed or assessed with clinical judgement   ADL Overall ADL's : Needs assistance/impaired Eating/Feeding: Minimal assistance;Bed level;With adaptive utensils Eating/Feeding Details (indicate cue type and reason): improving to Min A for self feeding using built up foam grips on utensils; assistance needed to stab food with fork due to incoordination but once food on fork, pt able to bring to mouth. Pt able to use R hand to hold cup and bring to mouth with hand over hand assist. L hand often attempting to assist/complete tasks when cued with RUE                                   General  ADL Comments: pt also inquiring good questions regarding why his speech is altered, comfirms that SO comes in to assist with ROM of R side with noted improvements in pain/flexibility    Extremity/Trunk Assessment Upper Extremity Assessment Upper Extremity Assessment: RUE deficits/detail;LUE deficits/detail RUE Deficits / Details: able to  make fist and extend digits actively though increased stiffness noted in PIPs. able to stretch digits more easily today and improving pain levels. hand over hand assist to bend elbow. AAROM for shoulder abduction/flexion to 90* noted darker area  on L 1st digit on dorsal aspect, likely from splint - discontinued splint orders and encouraged pt to rest with digits extended 7/22. Today 7/25- pt independently showing use of L hand to stretch R digits RUE Sensation: decreased light touch RUE Coordination: decreased fine motor;decreased gross motor LUE Deficits / Details: Using LUE, often times when cued to use R UE, involuntary movements at times LUE Coordination: decreased gross motor;decreased fine motor   Lower Extremity Assessment Lower Extremity Assessment: Defer to PT evaluation        Vision   Vision Assessment?: No apparent visual deficits   Perception     Praxis      Cognition Arousal/Alertness: Awake/alert Behavior During Therapy: WFL for tasks assessed/performed Overall Cognitive Status: Difficult to assess Area of Impairment: Attention, Safety/judgement, Awareness, Following commands, Problem solving                   Current Attention Level: Selective   Following Commands: Follows one step commands with increased time, Follows one step commands inconsistently Safety/Judgement: Decreased awareness of deficits, Decreased awareness of safety Awareness: Emergent Problem Solving: Slow processing, Requires verbal cues, Requires tactile cues General Comments: speech with continued improvements, able to understand at least 90% of pt speech/needs expression. follows directions inconsistently d/t apraxia, impaired motor planning though improving with > 60% motor planning command following        Exercises      Shoulder Instructions       General Comments RN present, observed pt ability to complete self feeding with a little assist    Pertinent Vitals/ Pain       Pain Assessment Pain Assessment: Faces Faces Pain Scale: Hurts a little bit Pain Location: R hand w/ ROM Pain Descriptors / Indicators: Grimacing Pain Intervention(s): Monitored during session  Home Living                                           Prior Functioning/Environment              Frequency  Min 1X/week        Progress Toward Goals  OT Goals(current goals can now be found in the care plan section)  Progress towards OT goals: Progressing toward goals  Acute Rehab OT Goals OT Goal Formulation: With patient Time For Goal Achievement: 11/19/22 Potential to Achieve Goals: Fair ADL Goals Pt Will Perform Eating: with supervision;sitting;bed level;with adaptive utensils Pt Will Perform Grooming: with min guard assist;sitting Pt Will Perform Upper Body Bathing: with mod assist;sitting Pt Will Perform Lower Body Bathing: with mod assist;sitting/lateral leans Pt Will Perform Upper Body Dressing: with mod assist;sitting Pt Will Perform Lower Body Dressing: with max assist;sitting/lateral leans;sit to/from stand Pt Will Transfer to Toilet: squat pivot transfer;stand pivot transfer;bedside commode;with mod assist Additional ADL Goal #1: Patient will tolerate R resting hand splint 4 hours on  and 4 off with no skin integrity issues.  Plan Discharge plan remains appropriate    Co-evaluation                 AM-PAC OT "6 Clicks" Daily Activity     Outcome Measure   Help from another person eating meals?: A Little Help from another person taking care of personal grooming?: A Little Help from another person toileting, which includes using toliet, bedpan, or urinal?: Total Help from another person bathing (including washing, rinsing, drying)?: Total Help from another person to put on and taking off regular upper body clothing?: A Lot Help from another person to put on and taking off regular lower body clothing?: Total 6 Click Score: 11    End of Session    OT Visit Diagnosis: Unsteadiness on feet (R26.81);Other abnormalities of gait and mobility (R26.89);Muscle weakness (generalized) (M62.81);Cognitive communication deficit (R41.841);Hemiplegia and hemiparesis;Low vision, both eyes  (H54.2);Apraxia (R48.2);Other symptoms and signs involving cognitive function Symptoms and signs involving cognitive functions: Cerebral infarction Hemiplegia - Right/Left: Right Hemiplegia - dominant/non-dominant: Dominant Hemiplegia - caused by: Cerebral infarction   Activity Tolerance Patient tolerated treatment well   Patient Left in bed;with call bell/phone within reach;with bed alarm set;with nursing/sitter in room   Nurse Communication Mobility status;Other (comment) (feeding w/ AE)        Time: 6213-0865 OT Time Calculation (min): 29 min  Charges: OT General Charges $OT Visit: 1 Visit OT Treatments $Self Care/Home Management : 23-37 mins  Bradd Canary, OTR/L Acute Rehab Services Office: [phone]   Lorre Munroe 11/15/2022, 8:46 AM

## 2022-11-15 NOTE — Plan of Care (Signed)
Vitals stable. NIH no change from previous shift. Motrin and flexeril given for pain and spasms in right arm  Problem: Education: Goal: Knowledge of disease or condition will improve Outcome: Progressing Goal: Knowledge of secondary prevention will improve (MUST DOCUMENT ALL) Outcome: Progressing Goal: Knowledge of patient specific risk factors will improve [name] N/A or DELETE if not current risk factor) Outcome: Progressing   Problem: Ischemic Stroke/TIA Tissue Perfusion: Goal: Complications of ischemic stroke/TIA will be minimized Outcome: Progressing   Problem: Coping: Goal: Will verbalize positive feelings about self Outcome: Progressing Goal: Will identify appropriate support needs Outcome: Progressing   Problem: Health Behavior/Discharge Planning: Goal: Ability to manage health-related needs will improve Outcome: Progressing Goal: Goals will be collaboratively established with patient/family Outcome: Progressing   Problem: Self-Care: Goal: Ability to participate in self-care as condition permits will improve Outcome: Progressing Goal: Verbalization of feelings and concerns over difficulty with self-care will improve Outcome: Progressing Goal: Ability to communicate needs accurately will improve Outcome: Progressing   Problem: Nutrition: Goal: Risk of aspiration will decrease Outcome: Progressing Goal: Dietary intake will improve Outcome: Progressing   Problem: Education: Goal: Knowledge of General Education information will improve Description: Including pain rating scale, medication(s)/side effects and non-pharmacologic comfort measures Outcome: Progressing   Problem: Health Behavior/Discharge Planning: Goal: Ability to manage health-related needs will improve Outcome: Progressing   Problem: Clinical Measurements: Goal: Ability to maintain clinical measurements within normal limits will improve Outcome: Progressing Goal: Will remain free from  infection Outcome: Progressing Goal: Diagnostic test results will improve Outcome: Progressing Goal: Respiratory complications will improve Outcome: Progressing Goal: Cardiovascular complication will be avoided Outcome: Progressing   Problem: Activity: Goal: Risk for activity intolerance will decrease Outcome: Progressing   Problem: Nutrition: Goal: Adequate nutrition will be maintained Outcome: Progressing   Problem: Coping: Goal: Level of anxiety will decrease Outcome: Progressing   Problem: Elimination: Goal: Will not experience complications related to bowel motility Outcome: Progressing Goal: Will not experience complications related to urinary retention Outcome: Progressing   Problem: Pain Managment: Goal: General experience of comfort will improve Outcome: Progressing   Problem: Safety: Goal: Ability to remain free from injury will improve Outcome: Progressing   Problem: Skin Integrity: Goal: Risk for impaired skin integrity will decrease Outcome: Progressing   Problem: Education: Goal: Understanding of CV disease, CV risk reduction, and recovery process will improve Outcome: Progressing Goal: Individualized Educational Video(s) Outcome: Progressing   Problem: Activity: Goal: Ability to return to baseline activity level will improve Outcome: Progressing   Problem: Cardiovascular: Goal: Ability to achieve and maintain adequate cardiovascular perfusion will improve Outcome: Progressing Goal: Vascular access site(s) Level 0-1 will be maintained Outcome: Progressing   Problem: Health Behavior/Discharge Planning: Goal: Ability to safely manage health-related needs after discharge will improve Outcome: Progressing   Problem: Education: Goal: Ability to describe self-care measures that may prevent or decrease complications (Diabetes Survival Skills Education) will improve Outcome: Progressing Goal: Individualized Educational Video(s) Outcome:  Progressing   Problem: Coping: Goal: Ability to adjust to condition or change in health will improve Outcome: Progressing   Problem: Fluid Volume: Goal: Ability to maintain a balanced intake and output will improve Outcome: Progressing   Problem: Health Behavior/Discharge Planning: Goal: Ability to identify and utilize available resources and services will improve Outcome: Progressing Goal: Ability to manage health-related needs will improve Outcome: Progressing   Problem: Metabolic: Goal: Ability to maintain appropriate glucose levels will improve Outcome: Progressing   Problem: Nutritional: Goal: Maintenance of adequate nutrition will improve Outcome: Progressing Goal:  Progress toward achieving an optimal weight will improve Outcome: Progressing   Problem: Skin Integrity: Goal: Risk for impaired skin integrity will decrease Outcome: Progressing   Problem: Tissue Perfusion: Goal: Adequacy of tissue perfusion will improve Outcome: Progressing

## 2022-11-16 DIAGNOSIS — I6300 Cerebral infarction due to thrombosis of unspecified precerebral artery: Secondary | ICD-10-CM | POA: Diagnosis not present

## 2022-11-16 LAB — GLUCOSE, CAPILLARY
Glucose-Capillary: 97 mg/dL (ref 70–99)
Glucose-Capillary: 97 mg/dL (ref 70–99)
Glucose-Capillary: 104 mg/dL — ABNORMAL HIGH (ref 70–99)

## 2022-11-16 NOTE — Discharge Summary (Signed)
Physician Discharge Summary  [name] BJY:[id number] DOB: [birth year] DOA: 10/02/2022  PCP: [name], MD  Admit date: 10/02/2022 Discharge date: 11/16/2022  Admitted From: Home Disposition:  SNF  Recommendations for Outpatient Follow-up:  Follow up with PCP in 1-2 weeks Please obtain BMP/CBC in one week your next doctors visit.  Now on Eliquis Cont statin Coreg, entresto and Aldactone Encourage PO intake. Has Peg in place, placed on 7/10. Follow up outpatient by Gen Surgery On Fluoxetine 10mg  daily Hold Bolus tube feeds if he eats >75%. Otherwise tube feed recs as below: Jevity 1.5 4x/d ( total/d or 4 cartons). Free water 30mL free water before and after each bolus    Discharge Condition: Stable CODE STATUS: DNR Diet recommendation: Heart Heatlthy.   Brief/Interim Summary: 62 y.o. 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  Remains in the hospital.  Hospital course complicated by hemorrhagic conversion of the stroke. Improving now.   During the hospitalization patient underwent an IR thrombectomy but the following day on 6/19 developed frontal IPH and SAH.  CT scan showed progression of hematoma, echocardiogram showed concerns of possible dehiscence of prior patch with adherent LV thrombus.  Hospital course was also complicated by MSSA bacteremia, EP was consulted but did not recommend ICD removal.  Patient also had a PEG tube placed.  Eventually ICH appeared to be stable on repeat CT scan, TEE was negative, antibiotics were stopped and Eliquis was started on 7/17.   Patient remains medically stable for discharge - awaiting safe disposition (SNF) pending insurance authorization.   6/18: Admitted for stroke, received TNK; developed right sided deficits overnight after TNK, underwent NIR  thrombectomy 6/19: Post thrombectomy CT showed left frontal IPH and SAH 6/20: Heparin started cautiously 6/22: CT showed progression of hematomas, heparin stopped 6/23: Repeat echo shows dehiscence of prior pseudoaneurysm patch with adherent LV thrombus; Cardiology consulted 6/26: ID consulted for MSSE bacteremia 6/28: EP consulted, recommended not to take out ICD; CT stabilized, heparin restarted 7/2: Transferred [facility] 7/10: Placed PEG tube 7/11: Transitioned to basal bolus feedings and switched to Eliquis 7/13: Increased HA, repeat CT shows progression of ICHs; Eliquis held 7/15: TEE negative, antibiotics stopped 7/17: Eliquis restarted   Significant studies: 6/18 CTA head and neck: no LVO 6/18 repeat CTA head and neck: new distal left MCA M2 occlusion 6/19 Echo: poor quality images, nondiagnostic 6/19 TCD: positive study, indicating R>L shunt 6/19 LE doppler: no DVT 6/19 CT head: left frontal parietal IPH and adjacent [facility] 6/22 CT head: size now 8x4x4cm, with new 2cm component 6/23 complete echo: now shows recurrence of pseudoaneurysm, dehiscence of patch with adherent thrombus 6/25 coronary CTA: confirms above 6/28 CT head: stable large left frontoparietal hematoma 7/13: CT head shows worsening ICH 7/15 TEE: shows pseudoaneurysm with adherent clot, no valvular vegetations, no signs of vegetation on ICD leads 7/16: CT head shows regression of ICH noted 7/13 7/19: CT head shows no change in ICH   Procedures: 6/18: TNK at 1246 6/19: Thrombectomy by Dr. Tommie Sams 6/19: Cortrak placed 7/10: PEG placed 7/15: TEE 7/26: Seeking SNF placement. Medically stable to discharge. 8/2 : approved for dc, insurance auth recieved     Assessment and Plan:   Acute stroke with intraparenchymal hematoma: Patient admitted and underwent TNK.  Later same day  had second stroke with left M2 occlusion and underwent thrombectomy. Source of both likely cardioembolic from pseudoaneurysm.  Unfortunately, post-TNK/thrombectomy course complicated by ICH and SAH.   - Heparin was started due to pseudoaneurysm but ICH size increased so this was held for 1 week.  Serial CTs eventually showed stabilization, transitioned to heparin again, and finally eliquis. Continue Lipitor.    S. epidermidis bacteremia: Completed 2 weeks IV ancef per ID. No valvular or ICD lead vegetations by TEE.     Pseudoaneurysm of left ventricle: Dx 2014 s/p patch repair of rupture w/pericardial tamponade and CABG x1 (WVG > OM1) Oct 2014 by Dr. Dorris Fetch. Stable in Jan 2024 by echo, but patch noted to be dehisced with adherent thrombus on echo this admission. No signs or symptoms of tamponade at this time.  Now patient is on Eliquis therefore discontinue DAPT - Follow up with cardiothoracic surgery for consideration of repeat repair if he is surgical candidate.     Acute on chronic HFrEF, HTN: Now euvolemic after Tx.  Overall volume status appears to be stable.  Patient is now on cardiac medications including Coreg, Entresto, Aldactone.   Dyslipidemia - Continue lipitor   Dysphagia: PEG placed 7/10.   - Oral intake has been sufficient. Hold bolus tube feeds if pt takes 75% of meal.    Thrombocytopenia: Improved Stable   Aspiration pneumonia: Tx w/IV antibiotics, resolved.    ICD in place:   Adjustment reaction: - Was started on fluoxetine 10mg .      Discharge Diagnoses:  Principal Problem:   Stroke (cerebrum) (HCC) Active Problems:   Bacteremia due to Gram-positive bacteria   Pseudoaneurysm of left ventricle of heart   Acute respiratory failure with hypoxia (HCC)   Coronary artery disease   Dysphagia   Dyslipidemia   Essential hypertension   Obesity (BMI 30.0-34.9)   ICD (implantable cardioverter-defibrillator) in place   Acute on chronic systolic CHF (congestive heart failure) (HCC)   Aspiration pneumonia (HCC)   Thrombocytopenia (HCC)   Cerebral edema (HCC)      Subjective: No  complaints  Discharge Exam: Vitals:   11/16/22 0431 11/16/22 0828  BP: 95/67 105/70  Pulse: 78 72  Resp:  18  Temp: 97.7 F (36.5 C) 98.7 F (37.1 C)  SpO2: 99% 97%   Vitals:   11/16/22 0010 11/16/22 0431 11/16/22 0435 11/16/22 0828  BP: 102/78 95/67  105/70  Pulse: 83 78  72  Resp:    18  Temp: 98.3 F (36.8 C) 97.7 F (36.5 C)  98.7 F (37.1 C)  TempSrc: Oral Axillary    SpO2: 98% 99%  97%  Weight:   96.9 kg   Height:        General: Pt is alert, awake, not in acute distress Cardiovascular: RRR, S1/S2 +, no rubs, no gallops Respiratory: CTA bilaterally, no wheezing, no rhonchi Abdominal: Soft, NT, ND, bowel sounds + Extremities: no edema, no cyanosis  Discharge Instructions   Allergies as of 11/16/2022   No Known Allergies      Medication List     STOP taking these medications    aspirin EC 81 MG tablet   clopidogrel 75 MG tablet Commonly known as: Plavix   Entresto 97-103 MG Generic drug: sacubitril-valsartan Replaced by: sacubitril-valsartan 24-26 MG   furosemide 20 MG tablet Commonly known as: LASIX   gabapentin 300 MG capsule Commonly known as: NEURONTIN Replaced by: gabapentin 250 MG/5ML solution       TAKE these medications  Alphagan P 0.1 % Soln Generic drug: brimonidine INSTILL 1 DROP INTO BOTH EYES TWICE A DAY What changed: when to take this   apixaban 5 MG Tabs tablet Commonly known as: ELIQUIS Take 1 tablet (5 mg total) by mouth 2 (two) times daily.   atorvastatin 80 MG tablet Commonly known as: LIPITOR Take 1 tablet (80 mg total) by mouth daily.   carvedilol 3.125 MG tablet Commonly known as: COREG Take 1 tablet (3.125 mg total) by mouth 2 (two) times daily with a meal. What changed:  medication strength how much to take   Centrum Men Tabs Take 1 tablet by mouth daily.   cyclobenzaprine 5 MG tablet Commonly known as: FLEXERIL Take 1 tablet (5 mg total) by mouth 3 (three) times daily as needed for muscle  spasms.   docusate sodium 100 MG capsule Commonly known as: COLACE Take 100 mg by mouth 2 (two) times daily.   famotidine 20 MG tablet Commonly known as: PEPCID Take 1 tablet (20 mg total) by mouth 2 (two) times daily.   Farxiga 10 MG Tabs tablet Generic drug: dapagliflozin propanediol TAKE 1 TABLET EVERY DAY What changed: how much to take   fiber supplement (BANATROL TF) liquid Place 60 mLs into feeding tube 2 (two) times daily.   FLUoxetine 10 MG capsule Commonly known as: PROZAC Take 1 capsule (10 mg total) by mouth daily.   fluticasone 50 MCG/ACT nasal spray Commonly known as: FLONASE Place 1 spray into both nostrils daily.   gabapentin 250 MG/5ML solution Commonly known as: NEURONTIN Take 6 mLs (300 mg total) by mouth at bedtime. Replaces: gabapentin 300 MG capsule   latanoprost 0.005 % ophthalmic solution Commonly known as: XALATAN Place 1 drop into both eyes at bedtime.   nitroGLYCERIN 0.4 MG SL tablet Commonly known as: NITROSTAT Place 1 tablet (0.4 mg total) under the tongue every 5 (five) minutes x 3 doses as needed for chest pain.   polyethylene glycol 17 g packet Commonly known as: MIRALAX / GLYCOLAX Take 17 g by mouth 2 (two) times daily as needed.   sacubitril-valsartan 24-26 MG Commonly known as: ENTRESTO Take 1 tablet by mouth 2 (two) times daily. Replaces: Entresto 97-103 MG   spironolactone 25 MG tablet Commonly known as: ALDACTONE Take 0.5 tablets (12.5 mg total) by mouth daily.   Vitamin D3 50 MCG (2000 UT) Tabs Take 1 tablet by mouth daily.        Contact information for after-discharge care     Destination     [facility]erred [facility] .   Service: Skilled Nursing Contact information: [address] [place] [address] [phone]                    No Known Allergies  You were cared for by a hospitalist during your hospital stay. If you have any questions about  your discharge medications or the care you received while you were in the hospital after you are discharged, you can call the unit and asked to speak with the hospitalist on call if the hospitalist that took care of you is not available. Once you are discharged, your primary care physician will handle any further medical issues. Please note that no refills for any discharge medications will be authorized once you are discharged, as it is imperative that you return to your primary care physician (or establish a relationship with a primary care physician if you do not have one) for your  aftercare needs so that they can reassess your need for medications and monitor your lab values.  You were cared for by a hospitalist during your hospital stay. If you have any questions about your discharge medications or the care you received while you were in the hospital after you are discharged, you can call the unit and asked to speak with the hospitalist on call if the hospitalist that took care of you is not available. Once you are discharged, your primary care physician will handle any further medical issues. Please note that NO REFILLS for any discharge medications will be authorized once you are discharged, as it is imperative that you return to your primary care physician (or establish a relationship with a primary care physician if you do not have one) for your aftercare needs so that they can reassess your need for medications and monitor your lab values.  Please request your Prim.MD to go over all Hospital Tests and Procedure/Radiological results at the follow up, please get all Hospital records sent to your Prim MD by signing hospital release before you go home.  Get CBC, CMP, 2 view Chest X ray checked  by Primary MD during your next visit or SNF MD in 5-7 days ( we routinely change or add medications that can affect your baseline labs and fluid status, therefore we recommend that you get the mentioned basic  workup next visit with your PCP, your PCP may decide not to get them or add new tests based on their clinical decision)  On your next visit with your primary care physician please Get Medicines reviewed and adjusted.  If you experience worsening of your admission symptoms, develop shortness of breath, life threatening emergency, suicidal or homicidal thoughts you must seek medical attention immediately by calling 911 or calling your MD immediately  if symptoms less severe.  You Must read complete instructions/literature along with all the possible adverse reactions/side effects for all the Medicines you take and that have been prescribed to you. Take any new Medicines after you have completely understood and accpet all the possible adverse reactions/side effects.   Do not drive, operate heavy machinery, perform activities at heights, swimming or participation in water activities or provide baby sitting services if your were admitted for syncope or siezures until you have seen by Primary MD or a Neurologist and advised to do so again.  Do not drive when taking Pain medications.   Procedures/Studies: CT HEAD WO CONTRAST ( )  Result Date: 11/02/2022 CLINICAL DATA:  Headache, increasing frequency or severity h/o ICH, on eliquis. EXAM: CT HEAD WITHOUT CONTRAST TECHNIQUE: Contiguous axial images were obtained from the base of the skull through the vertex without intravenous contrast. RADIATION DOSE REDUCTION: This exam was performed according to the departmental dose-optimization program which includes automated exposure control, adjustment of the mA and/or kV according to patient size and/or use of iterative reconstruction technique. COMPARISON:  CT head 10/30/2022. FINDINGS: Brain: Similar small volume of intraventricular hemorrhage layering in the occipital horns. Left frontoparietal intraparenchymal hematoma is similar and size and slightly decreased in attenuation. The subacute intraparenchymal  hemorrhage in the posterior limb of the left internal capsule is also not substantially changed. Similar multifocal encephalomalacia involving bilateral cerebellar hemispheres and cerebral hemispheres. Trace subarachnoid hemorrhage at the vertex is similar. No hydrocephalus or midline shift. Vascular: No hyperdense vessel or unexpected calcification. Skull: No acute fracture. Sinuses/Orbits: No acute abnormality. Other: No mastoid effusions. IMPRESSION: Intraparenchymal, intraventricular and subarachnoid hemorrhage is similar. Electronically Signed  By: Feliberto Harts M.D.   On: 11/02/2022 13:32   CT HEAD WO CONTRAST ( )  Result Date: 10/30/2022 CLINICAL DATA:  Stroke follow-up EXAM: CT HEAD WITHOUT CONTRAST TECHNIQUE: Contiguous axial images were obtained from the base of the skull through the vertex without intravenous contrast. RADIATION DOSE REDUCTION: This exam was performed according to the departmental dose-optimization program which includes automated exposure control, adjustment of the mA and/or kV according to patient size and/or use of iterative reconstruction technique. COMPARISON:  CT head 10/27/2022. FINDINGS: Brain: The subacute left frontoparietal intraparenchymal hematoma measuring up to 2.7 cm is unchanged in size but overall decreased in density consistent with expected evolution. The subacute intraparenchymal hematoma in the posterior limb of the left internal capsule measuring up to 2.0 cm is unchanged in size. The subcentimeter focus of hemorrhage is superior in the left periventricular white matter is unchanged (3-20). Subarachnoid hemorrhage overlying both occipital lobes is similar in volume. Subarachnoid blood overlying both cerebellar hemispheres is not significantly changed in volume. There is small volume subarachnoid hemorrhage overlying both cerebral hemispheres near the vertex, likely redistributed blood (3-25). Small volume intraventricular blood layering in the right  occipital horn is unchanged. Intraventricular blood in the left occipital horn is no longer seen. Multifocal encephalomalacia in both cerebral and cerebellar hemispheres is unchanged. There is no new acute territorial infarct. The basal cisterns remain patent. The ventricles are stable in size, with no new or progressive hydrocephalus. There is no midline shift. Vascular: No hyperdense vessel or unexpected calcification. Skull: Normal. Negative for fracture or focal lesion. Sinuses/Orbits: The imaged paranasal sinuses are clear. Bilateral lens implants are in place. The globes and orbits are otherwise unremarkable. Other: The mastoid air cells and middle ear cavities are clear. IMPRESSION: Intraventricular blood in the left occipital horn is no longer apparent. Otherwise, intraparenchymal and subarachnoid blood in both cerebral and cerebellar hemispheres and small volume intraventricular blood in the right lateral ventricle are similar in volume with expected evolution since 10/27/2022. No midline shift. Electronically Signed   By: Lesia Hausen M.D.   On: 10/30/2022 15:57   DG Swallowing Func-Speech Pathology  Result Date: 10/30/2022 Table formatting from the original result was not included. Modified Barium Swallow Study Patient Details Name: [name] MRN: [id number] Date of Birth: 1961/04/06 Today's Date: 10/30/2022 HPI/PMH: HPI: Pt is a 62 y/o male who presented 6/19 with L weakness and numbness. Pt received TNK then developed new symptoms of aphasia and R weakness. He underwent mechanical thrombectomy L M3 segment MCA 6/19. Pt noted to have small hemorrhage and follow up CT showed progression of hemorrhage 3cm then 7cm and small acute R cerebellar infarct. PEG placed 7/10. PMH: CAD s/p PCI w/stent and 1v CABG, multiple CGAs, systolic HF s/p ICD, HLD, HTN. Clinical Impression: Clinical Impression: Pt seen today for a third MBS following suspected improvements in swallowing function noted at bedside. Pt  with improving ability to follow commands to initiate a second swallow. Overall, pt presents with a mild oropharyngeal dysphagia characterized by R CN VII and CN XII weakness. Compared to prior MBS, oral and pharyngeal residue has significantly improved, although is not completely resolved specifically with solid textures. He successfully cleared all residue with cued subsequent swallows and sips of thin liquids. No penetration or aspiration observed with trials of nectar thick liquids, honey thick liquids, purees, or solids. Challenging consecutive sips of thin liquids via straw following a large solid bolus resulted in penetration to the level of the cords (PAS  3) before the swallow with immediate sensation and a strong cough to clear material from airway independently. Recommend initiating a diet of Dys 2 textures and thin liquids only with full supervision. Following bites with sips will likely be helpful to clear any residue. Meds can be adminsitered through G tube, although could likely be crushed in puree PRN. Factors that may increase risk of adverse event in presence of aspiration [name] & [name] 2021): Factors that may increase risk of adverse event in presence of aspiration [name] & [name] 2021): Limited mobility; Inadequate oral hygiene; Dependence for feeding and/or oral hygiene; Reduced cognitive function Recommendations/Plan: Swallowing Evaluation Recommendations Swallowing Evaluation Recommendations Recommendations: PO diet PO Diet Recommendation: Thin liquids (Level 0); Dysphagia 2 (Finely chopped) Liquid Administration via: Straw; Cup Medication Administration: Via alternative means Supervision: Full supervision/cueing for swallowing strategies; Staff to assist with self-feeding Swallowing strategies  : Slow rate; Small bites/sips; Check for anterior loss; Check for pocketing or oral holding Postural changes: Position pt fully upright for meals; Stay upright 30-60 min after meals Oral care  recommendations: Oral care BID (2x/day) Caregiver Recommendations: Have oral suction available Treatment Plan Treatment Plan Treatment recommendations: Therapy as outlined in treatment plan below Follow-up recommendations: Acute inpatient rehab (3 hours/day) Functional status assessment: Patient has had a recent decline in their functional status and demonstrates the ability to make significant improvements in function in a reasonable and predictable amount of time. Treatment frequency: Min 2x/week Treatment duration: 1 week Interventions: Aspiration precaution training; Compensatory techniques; Patient/family education; Trials of upgraded texture/liquids; Diet toleration management by SLP Recommendations Recommendations for follow up therapy are one component of a multi-disciplinary discharge planning process, led by the attending physician.  Recommendations may be updated based on patient status, additional functional criteria and insurance authorization. Assessment: Orofacial Exam: Orofacial Exam Oral Cavity: Oral Hygiene: WFL Oral Cavity - Dentition: Edentulous Orofacial Anatomy: WFL Oral Motor/Sensory Function: Suspected cranial nerve impairment CN V - Trigeminal: Right motor impairment; Right sensory impairment CN VII - Facial: Right motor impairment CN IX - Glossopharyngeal, CN X - Vagus: Right motor impairment CN XII - Hypoglossal: Right motor impairment Anatomy: Anatomy: Suspected cervical osteophytes Boluses Administered: Boluses Administered Boluses Administered: Thin liquids (Level 0); Mildly thick liquids (Level 2, nectar thick); Moderately thick liquids (Level 3, honey thick); Puree  Oral Impairment Domain: Oral Impairment Domain Lip Closure: Escape progressing to mid-chin Tongue control during bolus hold: Not tested Bolus preparation/mastication: Slow prolonged chewing/mashing with complete recollection Bolus transport/lingual motion: Brisk tongue motion Oral residue: Residue collection on oral  structures Location of oral residue : Tongue; Palate Initiation of pharyngeal swallow : Pyriform sinuses  Pharyngeal Impairment Domain: Pharyngeal Impairment Domain Soft palate elevation: No bolus between soft palate (SP)/pharyngeal wall (PW) Laryngeal elevation: Complete superior movement of thyroid cartilage with complete approximation of arytenoids to epiglottic petiole Anterior hyoid excursion: Partial anterior movement Epiglottic movement: Complete inversion Laryngeal vestibule closure: Incomplete, narrow column air/contrast in laryngeal vestibule Pharyngeal stripping wave : Present - complete Pharyngeal contraction (A/P view only): N/A Pharyngoesophageal segment opening: Partial distention/partial duration, partial obstruction of flow Tongue base retraction: Trace column of contrast or air between tongue base and PPW Pharyngeal residue: Collection of residue within or on pharyngeal structures Location of pharyngeal residue: Tongue base; Pyriform sinuses; Valleculae  Esophageal Impairment Domain: Esophageal Impairment Domain Esophageal clearance upright position: -- (N/A) Pill: Pill Consistency administered: -- (N/A) Penetration/Aspiration Scale Score: Penetration/Aspiration Scale Score 1.  Material does not enter airway: Mildly thick liquids (Level 2, nectar thick); Moderately  thick liquids (Level 3, honey thick); Puree; Solid 4.  Material enters airway, CONTACTS cords then ejected out: Thin liquids (Level 0) Compensatory Strategies: Compensatory Strategies Compensatory strategies: Yes Straw: Effective Effective Straw: Thin liquid (Level 0)   General Information: Caregiver present: No  Diet Prior to this Study: NPO; G-tube   Temperature : Normal   Respiratory Status: WFL   Supplemental O2: None (Room air)   History of Recent Intubation: No  Behavior/Cognition: Alert; Cooperative; Pleasant mood; Requires cueing Self-Feeding Abilities: Needs assist with self-feeding Baseline vocal quality/speech: Not observed  Volitional Cough: Able to elicit Volitional Swallow: Able to elicit Exam Limitations: Poor positioning Goal Planning: Prognosis for improved oropharyngeal function: Good Barriers to Reach Goals: Cognitive deficits; Language deficits No data recorded Patient/Family Stated Goal: pt eager to eat and drink Consulted and agree with results and recommendations: Patient Pain: Pain Assessment Pain Assessment: Faces Faces Pain Scale: 0 Pain Location: back Pain Descriptors / Indicators: Grimacing; Guarding Pain Intervention(s): Monitored during session; Repositioned End of Session: Start Time:SLP Start Time (ACUTE ONLY): 1010 Stop Time: SLP Stop Time (ACUTE ONLY): 1038 Time Calculation:SLP Time Calculation (min) (ACUTE ONLY): 28 min Charges: SLP Evaluations $ SLP Speech Visit: 1 Visit SLP Evaluations $MBS Swallow: 1 Procedure $Swallowing Treatment: 1 Procedure $Speech Treatment for Individual: 1 Procedure SLP visit diagnosis: SLP Visit Diagnosis: Dysphagia, oropharyngeal phase (R13.12) Past Medical History: Past Medical History: Diagnosis Date  Abnormal EKG   hx of ischemia showing up on ekg's  Acute myopericarditis   a. 03/2013: readm with hypoxia, tachycardia, elevated ESR/CRP, elevated troponin with Dressler syndrome and myopericarditis; treated with steroids.  Acute respiratory failure (HCC)   a. 01/2013: VDRF. b. 03/2013: hypoxia requiring supp O2 during adm, resolved by discharge.  C. difficile colitis   a. 01/2013 during prolonged adm. b. Recurred 03/2013.  Cardiac tamponade   a. 01/2013 s/p drain.  Cataracts, bilateral   Coronary artery disease   a. s/p MI in 2009 in [place] with stenting of the LCX and LAD;  b. 12/2012 NSTEMI/CAD: LM nl, LAD patent prox stent, LCX 50-70 isr (FFR 0.84), RCA dom, 165m, EF 40-45%-->Med Rx. c. 01/2013: anterolateral STEMI complicated by pericardial effusion (presumed purulent pericarditis) and tamponade s/p drain, ruptured LV pseudoaneurysm s/p CorMatrix patch, CABGx1 (SVG-OM1), fever, CVA,  VDRF, C diff.  CVA (cerebral infarction)   a. 01/2013 in setting of prolonged hospitalization - residual L arm weakness.  Dressler syndrome (HCC)   a. 03/2013: readm with hypoxia, tachycardia, elevated ESR/CRP, elevated troponin with Dressler syndrome and myopericarditis; treated with steroids.  Glaucoma   Hyperlipidemia   Hypokalemia   Hyponatremia   a. During late 2014.  Ischemic cardiomyopathy   a. Sept/Oct 2014: EF ~40% (ICM). b. 03/2013: EF 25-30%. Off ACEI due to hypotension (MIXED NICM/ICM).  Optic neuropathy, ischemic   Pericardial effusion   a. 01/2013: pericardial effusion (presumed purulent pericarditis) and cardiac tamponade s/p drain. b. Persistent moderate pericardial effusion 03/2013.  Pre-diabetes   Pseudoaneurysm of left ventricle of heart   a. 01/2013:  Ruptured inferoposterior LV pseudoaneurysm s/p CorMatrix patch.  Sinus bradycardia   asymptomatic  Stroke [facility]) 2009  weak lt side-lt arm  Tobacco abuse   Wears dentures   top Past Surgical History: Past Surgical History: Procedure Laterality Date  CARDIAC CATHETERIZATION  01/06/2013  CATARACT EXTRACTION Bilateral 05/2021  COLONOSCOPY WITH PROPOFOL N/A 07/03/2016  Procedure: COLONOSCOPY WITH PROPOFOL;  Surgeon: Ruffin Frederick, MD;  Location: [facility];  Service: Gastroenterology;  Laterality: N/A;  CORONARY ANGIOPLASTY WITH  STENT PLACEMENT  2000's X 2  "1 + 1" (01/06/2013)  CORONARY ARTERY BYPASS GRAFT N/A 01/28/2013  Procedure: CORONARY ARTERY BYPASS GRAFTING (CABG);  Surgeon: Loreli Slot, MD;  Location: [facility] OR;  Service: Open Heart Surgery;  Laterality: N/A;  CABG x one, using left greater saphenous vein harvested endoscopically  ICD IMPLANT N/A 05/25/2020  Procedure: ICD IMPLANT;  Surgeon: Hillis Range, MD;  Location: [facility]VAS[facility] CV LAB;  Service: Cardiovascular;  Laterality: N/A;  INTRAOPERATIVE TRANSESOPHAGEAL ECHOCARDIOGRAM N/A 01/28/2013  Procedure: INTRAOPERATIVE TRANSESOPHAGEAL ECHOCARDIOGRAM;  Surgeon: Loreli Slot, MD;  Location: [facility] OR;  Service: Open Heart Surgery;  Laterality: N/A;  IR CT HEAD LTD  10/03/2022  IR PERCUTANEOUS ART THROMBECTOMY/INFUSION INTRACRANIAL INC DIAG ANGIO  10/03/2022  IR US GUIDE VASC ACCESS RIGHT  10/03/2022  LAPAROSCOPY N/A 10/24/2022  Procedure: LAPAROSCOPY ASSISTED GASTROSTOMY (PEG) PLACEMENT;  Surgeon: Diamantina Monks, MD;  Location: [facility] OR;  Service: General;  Laterality: N/A;  LEFT HEART CATH AND CORS/GRAFTS ANGIOGRAPHY N/A 09/13/2020  Procedure: LEFT HEART CATH AND CORS/GRAFTS ANGIOGRAPHY;  Surgeon: Swaziland, Peter M, MD;  Location: [facility];  Service: Cardiovascular;  Laterality: N/A;  LEFT HEART CATHETERIZATION WITH CORONARY ANGIOGRAM N/A 01/06/2013  Procedure: LEFT HEART CATHETERIZATION WITH CORONARY ANGIOGRAM;  Surgeon: Peter M Swaziland, MD;  Location: [facility] CATH LAB;  Service: Cardiovascular;  Laterality: N/A;  LEFT HEART CATHETERIZATION WITH CORONARY ANGIOGRAM N/A 01/21/2013  Procedure: LEFT HEART CATHETERIZATION WITH CORONARY ANGIOGRAM;  Surgeon: Kathleene Hazel, MD;  Location: [facility] CATH LAB;  Service: Cardiovascular;  Laterality: N/A;  LUMBAR LAMINECTOMY/ DECOMPRESSION WITH MET-RX Right 06/30/2018  Procedure: Right minimally invasive Lumbar Three-Four Far lateral discectomy;  Surgeon: Jadene Pierini, MD;  Location: [facility] OR;  Service: Neurosurgery;  Laterality: Right;  Right minimally invasive Lumbar Three-Four Far lateral discectomy  MASS EXCISION Right 07/21/2014  Procedure: EXCISION RIGHT NECK MASS;  Surgeon: Harriette Bouillon, MD;  Location: [facility];  Service: General;  Laterality: Right;  PEG PLACEMENT N/A 10/24/2022  Procedure: ATTEMPTED PERCUTANEOUS ENDOSCOPIC GASTROSTOMY (PEG) PLACEMENT;  Surgeon: Diamantina Monks, MD;  Location: [facility] OR;  Service: General;  Laterality: N/A;  PERICARDIAL TAP N/A 01/24/2013  Procedure: PERICARDIAL TAP;  Surgeon: Micheline Chapman, MD;  Location: [facility] CATH LAB;  Service: Cardiovascular;  Laterality: N/A;  RADIOLOGY  WITH ANESTHESIA N/A 10/03/2022  Procedure: IR WITH ANESTHESIA;  Surgeon: Julieanne Cotton, MD;  Location: [facility] OR;  Service: Radiology;  Laterality: N/A;  RIGHT HEART CATHETERIZATION Right 01/24/2013  Procedure: RIGHT HEART CATH;  Surgeon: Micheline Chapman, MD;  Location: [facility] CATH LAB;  Service: Cardiovascular;  Laterality: Right;  TEE WITHOUT CARDIOVERSION N/A 10/29/2022  Procedure: TRANSESOPHAGEAL ECHOCARDIOGRAM;  Surgeon: [name], MD;  Location: [facility];  Service: Cardiovascular;  Laterality: N/A;  VENTRICULAR ANEURYSM RESECTION N/A 01/28/2013  Procedure: LEFT VENTRICULAR ANEURYSM REPAIR;  Surgeon: Loreli Slot, MD;  Location: [facility] OR;  Service: Open Heart Surgery;  Laterality: N/A; [name], M.A., CF-SLP Speech Language Pathology, Acute Rehabilitation Services Secure Chat preferred [phone] 10/30/2022, 11:30 AM  ECHO TEE  Result Date: 10/29/2022    TRANSESOPHOGEAL ECHO REPORT   Patient Name:   [name] Date of Exam: 10/29/2022 Medical Rec #:  [id number]         Height:       73.0 in Accession #:    [id number]        Weight:       215.6 lb Date of Birth:  1960/08/23  BSA:          2.221 m Patient Age:    62 years          BP:           97/78 mmHg Patient Gender: M                 HR:           78 bpm. Exam Location:  Inpatient Procedure: Transesophageal Echo, Color Doppler and Cardiac Doppler Indications:     Bacteremia, Stroke i63.9  History:         Patient has prior history of Echocardiogram examinations, most                  recent 10/07/2022. CHF, CAD, Defibrillator; Risk                  Factors:Hypertension and Dyslipidemia. Recurrence of prior LV                  basal inferior pseudoaneurysm.  Sonographer:     Harriette Bouillon RDCS Referring Phys:  [id number] Jonita Albee Diagnosing Phys: Zoila Shutter MD PROCEDURE: After discussion of the risks and benefits of a TEE, an informed consent was obtained from the patient. The transesophogeal probe was passed  without difficulty through the esophogus of the patient. Sedation performed by different physician. The patient was monitored while under deep sedation. Anesthestetic sedation was provided intravenously by Anesthesiology: 171mg  of Propofol, 100mg  of Lidocaine. The patient developed no complications during the procedure.  IMPRESSIONS  1. Large 3 x 5 cm pseudoaneruysm overlying the inferior/inferoseptal walls. Left ventricular ejection fraction, by estimation, is 30 to 35%. The left ventricle has moderately decreased function. The left ventricle demonstrates global hypokinesis. The left ventricular internal cavity size was mildly dilated. There is severe akinesis of the left ventricular, basal-mid inferoseptal wall and inferior wall.  2. No evidence for device lead infection. Right ventricular systolic function is moderately reduced. The right ventricular size is normal.  3. No left atrial/left atrial appendage thrombus was detected.  4. The mitral valve is grossly normal. Trivial mitral valve regurgitation.  5. The aortic valve is tricuspid. Aortic valve regurgitation is not visualized.  6. There is mild (Grade II) layered plaque involving the aortic arch and descending aorta.  7. Evidence of atrial level shunting detected by color flow Doppler. Agitated saline contrast bubble study was positive with shunting observed within 3-6 cardiac cycles suggestive of interatrial shunt. There is a moderately sized patent foramen ovale with predominantly right to left shunting across the atrial septum. Conclusion(s)/Recommendation(s): No evidence of vegetation/infective endocarditis on this transesophageael echocardiogram. Findings are concerning for an interatrial shunt as detailed above. FINDINGS  Left Ventricle: Large 3 x 5 cm pseudoaneruysm overlying the inferior/inferoseptal walls. Left ventricular ejection fraction, by estimation, is 30 to 35%. The left ventricle has moderately decreased function. The left ventricle  demonstrates global hypokinesis. Severe akinesis of the left ventricular, basal-mid inferoseptal wall and inferior wall. The left ventricular internal cavity size was mildly dilated. Right Ventricle: No evidence for device lead infection. The right ventricular size is normal. No increase in right ventricular wall thickness. Right ventricular systolic function is moderately reduced. Left Atrium: Left atrial size was normal in size. No left atrial/left atrial appendage thrombus was detected. Right Atrium: Right atrial size was normal in size. Pericardium: There is no evidence of pericardial effusion. Mitral Valve: The mitral valve is grossly normal. Trivial mitral valve regurgitation. Tricuspid  Valve: The tricuspid valve is grossly normal. Tricuspid valve regurgitation is trivial. Aortic Valve: The aortic valve is tricuspid. Aortic valve regurgitation is not visualized. Pulmonic Valve: The pulmonic valve was grossly normal. Pulmonic valve regurgitation is not visualized. Aorta: The aortic root and ascending aorta are structurally normal, with no evidence of dilitation. There is mild (Grade II) layered plaque involving the aortic arch and descending aorta. IAS/Shunts: Evidence of atrial level shunting detected by color flow Doppler. Agitated saline contrast was given intravenously to evaluate for intracardiac shunting. Agitated saline contrast bubble study was positive with shunting observed within 3-6 cardiac cycles suggestive of interatrial shunt. A moderately sized patent foramen ovale is detected with predominantly right to left shunting across the atrial septum. Additional Comments: A device lead is visualized. Spectral Doppler performed. Zoila Shutter MD Electronically signed by Zoila Shutter MD Signature Date/Time: 10/29/2022/2:45:12 PM    Final    EP STUDY  Result Date: 10/29/2022 See surgical note for result.  CT HEAD WO CONTRAST ( )  Result Date: 10/27/2022 CLINICAL DATA:  Headache, increasing  frequency or severity EXAM: CT HEAD WITHOUT CONTRAST TECHNIQUE: Contiguous axial images were obtained from the base of the skull through the vertex without intravenous contrast. RADIATION DOSE REDUCTION: This exam was performed according to the departmental dose-optimization program which includes automated exposure control, adjustment of the mA and/or kV according to patient size and/or use of iterative reconstruction technique. COMPARISON:  10/12/2022 CT head FINDINGS: Brain: Redemonstrated multifocal ill-defined hyperdense hemorrhage, with the largest area posterior left frontal/anterior left parietal lobe (series 3, image 22), which has diminished in size and density. Increased hypodensity surrounding this, likely edema Increased size of a previously noted hemorrhage involving the left posterior lentiform nucleus and periventricular white matter, now measuring approximately 18 x 16 x 20 mm (series 6, image 39 and series 3, image 18), previously 16 x 10 x 10 mm, with an additional 5 mm focus hyperdense hemorrhage superior and posterior to this (series 3, image 19). Additional acute appearing hemorrhage in the right occipital lobe (series 3, image 15),, left occipital lobe (series 3, image 18), left cerebellum (series 3, image 9), which correlate with sites of less acute appearing hemorrhage on the prior exam. Hyperdense hemorrhage is also noted layering in the bilateral occipital horns (series 3, image 16); on the prior exam the hemorrhage in these locations with less dense. Slightly increased size of the bilateral lateral ventricles and third ventricle. Previously noted blood products in the right cerebellum and right frontoparietal region decreased in conspicuity. Redemonstrated areas of encephalomalacia and edema. No significant midline shift. The basilar cisterns remain patent. Vascular: No hyperdense vessel. Skull: Negative for fracture or focal lesion. Sinuses/Orbits: No acute finding. Status post bilateral  lens replacements. Other: The mastoid air cells are well aerated. IMPRESSION: 1. Findings concerning for new acute hemorrhage, with increased size and density of several of the previously noted areas of hemorrhage, particularly involving the posterior left lentiform nucleus/periventricular white matter, bilateral occipital lobes, and left cerebellum. 2. Acute hemorrhage layering in the bilateral occipital horns, with slightly increased size of the bilateral lateral ventricles and third ventricle, concerning for hydrocephalus. 3. Previously noted blood products in the right cerebellum and right frontoparietal region of decreased in conspicuity, consistent with aging hemorrhage. These results will be called to the ordering clinician or representative by the Radiologist Assistant, and communication documented in the PACS or [employer]. Electronically Signed   By: Wiliam Ke M.D.   On: 10/27/2022 20:18   CT  ABDOMEN WO CONTRAST  Result Date: 10/22/2022 CLINICAL DATA:  Dysplasia and history of hemorrhagic stroke. Assessment for possible placement of percutaneous gastrostomy tube. EXAM: CT ABDOMEN WITHOUT CONTRAST TECHNIQUE: Multidetector CT imaging of the abdomen was performed following the standard protocol without IV contrast. RADIATION DOSE REDUCTION: This exam was performed according to the departmental dose-optimization program which includes automated exposure control, adjustment of the mA and/or kV according to patient size and/or use of iterative reconstruction technique. COMPARISON:  None Available. FINDINGS: Lower chest: No acute abnormality. Hepatobiliary: No focal liver abnormality is seen. No gallstones, gallbladder wall thickening, or biliary dilatation. Pancreas: Unremarkable. No pancreatic ductal dilatation or surrounding inflammatory changes. Spleen: Normal in size without focal abnormality. Adrenals/Urinary Tract: Adrenal glands are unremarkable. Kidneys are normal, without renal calculi,  focal lesion, or hydronephrosis. Stomach/Bowel: No hiatal hernia. A feeding tube traverses the esophagus and stomach and terminates in the proximal duodenum. There is interposition of a dilated transverse colon and splenic flexure between the stomach and abdominal wall with absolutely no percutaneous window for safe gastric access to allow for percutaneous gastrostomy tube placement. The small bowel in the abdomen is nondilated. No free air identified. Vascular/Lymphatic: Atherosclerosis of the abdominal aorta without aneurysm. No lymphadenopathy identified. Other: No hernia, ascites or focal fluid collections. Musculoskeletal: No acute or significant osseous findings. IMPRESSION: 1. No percutaneous window to allow for safe percutaneous gastrostomy tube placement due to interposition of a dilated transverse colon and splenic flexure between the stomach and abdominal wall. 2. Feeding tube traverses the esophagus and stomach and terminates in the proximal duodenum. 3. Atherosclerosis of the abdominal aorta without aneurysm. Electronically Signed   By: Irish Lack M.D.   On: 10/22/2022 15:48   DG Swallowing Func-Speech Pathology  Result Date: 10/17/2022 Table formatting from the original result was not included. Modified Barium Swallow Study Patient Details Name: [name] MRN: [id number] Date of Birth: 1960-04-18 Today's Date: 10/17/2022 HPI/PMH: HPI: Pt is a 62 y/o male who presented 6/19 with L weakness and numbness. Pt received TNK then developed new symptoms of aphasia and R weakness. He underwent mechanical thrombectomy L M3 segment MCA 6/19. Pt noted to have small hemorrhage and follow up CT showed progression of hemorrhage 3cm then 7cm and small acute R cerebellar infarct. PMH: CAD s/p PCI w/stent and 1v CABG, multiple CGAs, systolic HF s/p ICD, HLD, HTN. Clinical Impression: Clinical Impression: This is pts second MBS during acute stay given a change in mental and respiratory status. Today, pts  function is worse than prior MBS. Pt is alert, needs visual cues to participate, does not follow verbal commands due to aphasia (cannot swallow twice for example, tilt head or clear oral residue). Pt also with intermittent rapid shallow breathing, almost like exertion or hyperventilation that impacts control. Pt with decreased right labial and lingual tension (prior MBS incorrectly says left - unable to edit) and more significant right sided oral residue. Pts oral phase also more sluggish and slow, cannot sip from a straw or achieve consecutive swallows as seen in prior study. There is increased residue in the pyriform sinuses with thick and puree texture that doesnt clear with dry swallows. This may be right sided pharyngeal residue, but AP view not available due to pts poor mobility to slide himself in the chair. Difficult to visualize pts trachea due to shadow of his shoulder, but pt also with late coughing with nectar/puree/honey that may indicate penetration or aspiration of pyriforms sinus residue. Definitive aspiration of residue WITHOUT  cough seen with thin liquids at the end of study. At this juncture, safe and adequate oral intake seems less likely in the short term. Pt may be able to initaite careful trials of nectar teaspoon and purees with full supervision, but risk of aspiration is higher than prior. Pt may benefit from discussion with palliative care about long term method of nutrition if intensive rehabilition is desired. If a more comfort approach is chosen, would recommend liberalizing pts diet with known risk. Factors that may increase risk of adverse event in presence of aspiration [name] [employer] [name] 2021): Factors that may increase risk of adverse event in presence of aspiration [name] [employer] [name] 2021): Limited mobility; Inadequate oral hygiene; Dependence for feeding and/or oral hygiene; Aspiration of thick, dense, and/or acidic materials; Weak cough; Reduced cognitive function  Recommendations/Plan: Swallowing Evaluation Recommendations Swallowing Evaluation Recommendations Recommendations: NPO Treatment Plan Treatment Plan Treatment recommendations: Therapy as outlined in treatment plan below Follow-up recommendations: Acute inpatient rehab (3 hours/day) Functional status assessment: Patient has had a recent decline in their functional status and demonstrates the ability to make significant improvements in function in a reasonable and predictable amount of time. Treatment frequency: Min 2x/week Treatment duration: 2 weeks Interventions: Aspiration precaution training Recommendations Recommendations for follow up therapy are one component of a multi-disciplinary discharge planning process, led by the attending physician.  Recommendations may be updated based on patient status, additional functional criteria and insurance authorization. Assessment: Orofacial Exam: Orofacial Exam Oral Cavity - Dentition: Edentulous Oral Motor/Sensory Function: Suspected cranial nerve impairment CN VII - Facial: Right motor impairment Anatomy: No data recorded Boluses Administered: Boluses Administered Boluses Administered: Thin liquids (Level 0); Mildly thick liquids (Level 2, nectar thick); Moderately thick liquids (Level 3, honey thick); Puree  Oral Impairment Domain: Oral Impairment Domain Lip Closure: Interlabial escape, no progression to anterior lip Tongue control during bolus hold: Not tested Bolus preparation/mastication: -- (didnt test solids) Bolus transport/lingual motion: Repetitive/disorganized tongue motion Oral residue: Residue collection on oral structures Location of oral residue : Floor of mouth; Lateral sulci Initiation of pharyngeal swallow : Pyriform sinuses  Pharyngeal Impairment Domain: Pharyngeal Impairment Domain Soft palate elevation: No bolus between soft palate (SP)/pharyngeal wall (PW) Laryngeal elevation: Partial superior movement of thyroid cartilage/partial approximation of  arytenoids to epiglottic petiole Anterior hyoid excursion: Partial anterior movement Epiglottic movement: Complete inversion Laryngeal vestibule closure: Complete, no air/contrast in laryngeal vestibule Pharyngeal stripping wave : Present - complete Pharyngeal contraction (A/P view only): -- (suspected) Pharyngoesophageal segment opening: Complete distension and complete duration, no obstruction of flow Tongue base retraction: Trace column of contrast or air between tongue base and PPW Pharyngeal residue: Collection of residue within or on pharyngeal structures Location of pharyngeal residue: Pyriform sinuses  Esophageal Impairment Domain: No data recorded Pill: No data recorded Penetration/Aspiration Scale Score: Penetration/Aspiration Scale Score 1.  Material does not enter airway: Mildly thick liquids (Level 2, nectar thick); Moderately thick liquids (Level 3, honey thick); Puree 8.  Material enters airway, passes BELOW cords without attempt by patient to eject out (silent aspiration) : Thin liquids (Level 0) Compensatory Strategies: No data recorded  General Information: Caregiver present: No  Diet Prior to this Study: NPO; Cortrak/Small bore NG tube   Temperature : Febrile   Respiratory Status: Increased WOB; Tachypneic   Supplemental O2: None (Room air)   History of Recent Intubation: No  Behavior/Cognition: Alert; Requires cueing Self-Feeding Abilities: Needs assist with self-feeding Baseline vocal quality/speech: Not observed Volitional Cough: Unable to elicit Volitional Swallow: Unable to  elicit No data recorded Goal Planning: No data recorded No data recorded No data recorded No data recorded No data recorded Pain: Pain Assessment Pain Assessment: Faces Faces Pain Scale: 0 Facial Expression: 0 Body Movements: 0 Muscle Tension: 0 Compliance with ventilator (intubated pts.): N/A Vocalization (extubated pts.): 0 CPOT Total: 0 Pain Intervention(s): Monitored during session End of Session: Start Time:SLP Start  Time (ACUTE ONLY): 0950 Stop Time: SLP Stop Time (ACUTE ONLY): 1015 Time Calculation:SLP Time Calculation (min) (ACUTE ONLY): 25 min Charges: SLP Evaluations $ SLP Speech Visit: 1 Visit SLP Evaluations $MBS Swallow: 1 Procedure $Swallowing Treatment: 1 Procedure SLP visit diagnosis: SLP Visit Diagnosis: Dysphagia, oropharyngeal phase (R13.12) Past Medical History: Past Medical History: Diagnosis Date  Abnormal EKG   hx of ischemia showing up on ekg's  Acute myopericarditis   a. 03/2013: readm with hypoxia, tachycardia, elevated ESR/CRP, elevated troponin with Dressler syndrome and myopericarditis; treated with steroids.  Acute respiratory failure (HCC)   a. 01/2013: VDRF. b. 03/2013: hypoxia requiring supp O2 during adm, resolved by discharge.  C. difficile colitis   a. 01/2013 during prolonged adm. b. Recurred 03/2013.  Cardiac tamponade   a. 01/2013 s/p drain.  Cataracts, bilateral   Coronary artery disease   a. s/p MI in 2009 in [place] with stenting of the LCX and LAD;  b. 12/2012 NSTEMI/CAD: LM nl, LAD patent prox stent, LCX 50-70 isr (FFR 0.84), RCA dom, 180m, EF 40-45%-->Med Rx. c. 01/2013: anterolateral STEMI complicated by pericardial effusion (presumed purulent pericarditis) and tamponade s/p drain, ruptured LV pseudoaneurysm s/p CorMatrix patch, CABGx1 (SVG-OM1), fever, CVA, VDRF, C diff.  CVA (cerebral infarction)   a. 01/2013 in setting of prolonged hospitalization - residual L arm weakness.  Dressler syndrome (HCC)   a. 03/2013: readm with hypoxia, tachycardia, elevated ESR/CRP, elevated troponin with Dressler syndrome and myopericarditis; treated with steroids.  Glaucoma   Hyperlipidemia   Hypokalemia   Hyponatremia   a. During late 2014.  Ischemic cardiomyopathy   a. Sept/Oct 2014: EF ~40% (ICM). b. 03/2013: EF 25-30%. Off ACEI due to hypotension (MIXED NICM/ICM).  Optic neuropathy, ischemic   Pericardial effusion   a. 01/2013: pericardial effusion (presumed purulent pericarditis) and cardiac tamponade s/p  drain. b. Persistent moderate pericardial effusion 03/2013.  Pre-diabetes   Pseudoaneurysm of left ventricle of heart   a. 01/2013:  Ruptured inferoposterior LV pseudoaneurysm s/p CorMatrix patch.  Sinus bradycardia   asymptomatic  Stroke [facility]) 2009  weak lt side-lt arm  Tobacco abuse   Wears dentures   top Past Surgical History: Past Surgical History: Procedure Laterality Date  CARDIAC CATHETERIZATION  01/06/2013  CATARACT EXTRACTION Bilateral 05/2021  COLONOSCOPY WITH PROPOFOL N/A 07/03/2016  Procedure: COLONOSCOPY WITH PROPOFOL;  Surgeon: Ruffin Frederick, MD;  Location: [facility];  Service: Gastroenterology;  Laterality: N/A;  CORONARY ANGIOPLASTY WITH STENT PLACEMENT  2000's X 2  "1 + 1" (01/06/2013)  CORONARY ARTERY BYPASS GRAFT N/A 01/28/2013  Procedure: CORONARY ARTERY BYPASS GRAFTING (CABG);  Surgeon: Loreli Slot, MD;  Location: [facility] OR;  Service: Open Heart Surgery;  Laterality: N/A;  CABG x one, using left greater saphenous vein harvested endoscopically  ICD IMPLANT N/A 05/25/2020  Procedure: ICD IMPLANT;  Surgeon: Hillis Range, MD;  [facility] INVASIVE CV LAB;  Service: Cardiovascular;  Laterality: N/A;  INTRAOPERATIVE TRANSESOPHAGEAL ECHOCARDIOGRAM N/A 01/28/2013  Procedure: INTRAOPERATIVE TRANSESOPHAGEAL ECHOCARDIOGRAM;  Surgeon: Loreli Slot, MD;  Location: [facility] OR;  Service: Open Heart Surgery;  Laterality: N/A;  IR CT HEAD LTD  10/03/2022  IR  PERCUTANEOUS ART THROMBECTOMY/INFUSION INTRACRANIAL INC DIAG ANGIO  10/03/2022  IR US GUIDE VASC ACCESS RIGHT  10/03/2022  LEFT HEART CATH AND CORS/GRAFTS ANGIOGRAPHY N/A 09/13/2020  Procedure: LEFT HEART CATH AND CORS/GRAFTS ANGIOGRAPHY;  Surgeon: Swaziland, Peter M, MD;  Location: [facility] INVASIVE CV LAB;  Service: Cardiovascular;  Laterality: N/A;  LEFT HEART CATHETERIZATION WITH CORONARY ANGIOGRAM N/A 01/06/2013  Procedure: LEFT HEART CATHETERIZATION WITH CORONARY ANGIOGRAM;  Surgeon: Peter M Swaziland, MD;  Location[facility] CATH LAB;  Service:  Cardiovascular;  Laterality: N/A;  LEFT HEART CATHETERIZATION WITH CORONARY ANGIOGRAM N/A 01/21/2013  Procedure: LEFT HEART CATHETERIZATION WITH CORONARY ANGIOGRAM;  Surgeon: Kathleene Hazel, MD;  Location: [facility] CATH LAB;  Service: Cardiovascular;  Laterality: N/A;  LUMBAR LAMINECTOMY/ DECOMPRESSION WITH MET-RX Right 06/30/2018  Procedure: Right minimally invasive Lumbar Three-Four Far lateral discectomy;  Surgeon: Jadene Pierini, MD;  [facility];  Service: Neurosurgery;  Laterality: Right;  Right minimally invasive Lumbar Three-Four Far lateral discectomy  MASS EXCISION Right 07/21/2014  Procedure: EXCISION RIGHT NECK MASS;  Surgeon: Harriette Bouillon, MD;  Location: [facility];  Service: General;  Laterality: Right;  PERICARDIAL TAP N/A 01/24/2013  Procedure: PERICARDIAL TAP;  Surgeon: Micheline Chapman, MD;  Location: [facility] CATH LAB;  Service: Cardiovascular;  Laterality: N/A;  RADIOLOGY WITH ANESTHESIA N/A 10/03/2022  Procedure: IR WITH ANESTHESIA;  Surgeon: Julieanne Cotton, MD;  [facility] OR;  Service: Radiology;  Laterality: N/A;  RIGHT HEART CATHETERIZATION Right 01/24/2013  Procedure: RIGHT HEART CATH;  Surgeon: Micheline Chapman, MD;  Location: [facility] CATH LAB;  Service: Cardiovascular;  Laterality: Right;  VENTRICULAR ANEURYSM RESECTION N/A 01/28/2013  Procedure: LEFT VENTRICULAR ANEURYSM REPAIR;  Surgeon: Loreli Slot, MD;  Location: [facility] OR;  Service: Open Heart Surgery;  Laterality: N/A; DeBlois, [name] 10/17/2022, 11:23 AM    The results of significant diagnostics from this hospitalization (including imaging, microbiology, ancillary and laboratory) are listed below for reference.     Microbiology: No results found for this or any previous visit (from the past 240 hour(s)).   Labs: BNP (last 3 results) No results for input(s): "BNP" in the last 8760 hours. Basic Metabolic Panel: Recent Labs  Lab 11/10/22 1119  NA 134*  K 4.3  CL 98  CO2 24  GLUCOSE  126*  BUN 12  CREATININE 0.78  CALCIUM 9.4   Liver Function Tests: Recent Labs  Lab 11/10/22 1119  AST 22  ALT 30  ALKPHOS 121  BILITOT 0.4  PROT 6.9  ALBUMIN 3.3*   No results for input(s): "LIPASE", "AMYLASE" in the last 168 hours. No results for input(s): "AMMONIA" in the last 168 hours. CBC: Recent Labs  Lab 11/10/22 1119  WBC 5.5  HGB 12.3*  HCT 36.4*  MCV 89.2  PLT 183   Cardiac Enzymes: No results for input(s): "CKTOTAL", "CKMB", "CKMBINDEX", "TROPONINI" in the last 168 hours. BNP: Invalid input(s): "POCBNP" CBG: Recent Labs  Lab 11/15/22 0622 11/15/22 1115 11/15/22 1636 11/15/22 2059 11/16/22 0602  GLUCAP 94 117* 117* 119* 97   D-Dimer No results for input(s): "DDIMER" in the last 72 hours. Hgb A1c No results for input(s): "HGBA1C" in the last 72 hours. Lipid Profile No results for input(s): "CHOL", "HDL", "LDLCALC", "TRIG", "CHOLHDL", "LDLDIRECT" in the last 72 hours. Thyroid function studies No results for input(s): "TSH", "T4TOTAL", "T3FREE", "THYROIDAB" in the last 72 hours.  Invalid input(s): "FREET3" Anemia work up No results for input(s): "VITAMINB12", "FOLATE", "FERRITIN", "TIBC", "IRON", "RETICCTPCT" in the last 72 hours. Urinalysis  Component Value Date/Time   COLORURINE AMBER (A) 10/09/2022 1510   APPEARANCEUR CLEAR 10/09/2022 1510   LABSPEC 1.028 10/09/2022 1510   PHURINE 5.0 10/09/2022 1510   GLUCOSEU NEGATIVE 10/09/2022 1510   HGBUR NEGATIVE 10/09/2022 1510   BILIRUBINUR NEGATIVE 10/09/2022 1510   KETONESUR NEGATIVE 10/09/2022 1510   PROTEINUR 100 (A) 10/09/2022 1510   UROBILINOGEN 4.0 (H) 01/28/2013 0847   NITRITE NEGATIVE 10/09/2022 1510   LEUKOCYTESUR NEGATIVE 10/09/2022 1510   Sepsis Labs Recent Labs  Lab 11/10/22 1119  WBC 5.5   Microbiology No results found for this or any previous visit (from the past 240 hour(s)).   Time coordinating discharge:  I have spent 35 minutes face to face with the patient and on  the ward discussing the patients care, assessment, plan and disposition with other care givers. >50% of the time was devoted counseling the patient about the risks and benefits of treatment/Discharge disposition and coordinating care.   SIGNED:   Dimple Nanas, MD  [facility] 11/16/2022, 9:44 AM   If 7PM-7AM, please contact night-coverage

## 2022-11-16 NOTE — TOC Transition Note (Signed)
Transition of Care [facility]) - CM/SW Discharge Note   Patient Details  Name: [name] MRN: [id number] Date of Birth: [birth year]  Transition of Care [facility]) CM/SW Contact:  [name], LCSW Phone Number: 11/16/2022, 10:54 AM   Clinical Narrative:  spoke to [facility] appeals dept this morning, verified pt has been approved for SNF. Confirmed with Alvino Chapel at [facility], they received auth details fax from [facility] and are prepared to admit pt to room 207. Unable to reach pt's son Asar by phone. Spoke to pt's SO [name] who reports agreeable to dc and will notify pt's son.  RN provided with number for report and PTAR arranged for transport. SW signing off at dc.   Dellie Burns, MSW, LCSW [phone] (coverage)      Final next level of care: Skilled Nursing Facility Barriers to Discharge: No Barriers Identified   Patient Goals and CMS Choice   Choice offered to / list presented to : Adult Children  Discharge Placement                Patient chooses bed at: [facility] Patient to be transferred to facility by: PTAR Name of family member notified: [name]/SO Patient and family notified of of transfer: 11/16/22  Discharge Plan and Services Additional resources added to the After Visit Summary for   In-house Referral: Clinical Social Work                                   Social Determinants of Health (SDOH) Interventions SDOH Screenings   Food Insecurity: Patient Unable To Answer (10/11/2022)  Housing: Patient Unable To Answer (10/11/2022)  Transportation Needs: Patient Unable To Answer (10/11/2022)  Utilities: Patient Unable To Answer (10/11/2022)  Depression (PHQ2-9): Low Risk  (05/08/2022)  Social Connections: Unknown (08/28/2021)   Received from [facility]  Tobacco Use: High Risk (10/29/2022)     Readmission Risk Interventions     No data to display

## 2022-11-21 ENCOUNTER — Ambulatory Visit: Payer: Medicare HMO

## 2022-11-28 NOTE — Progress Notes (Signed)
No ICM remote transmission received for 11/26/2022 and next ICM transmission scheduled for 12/31/2022.

## 2022-12-11 NOTE — Progress Notes (Deleted)
Cardiology Office Note Date:  12/11/2022  Patient ID:  [name], [name] Dec 18, 1960, MRN [id number] PCP:  [name], MD  Cardiologist:  Dr. Swaziland Electrophysiologist: Dr. Elberta Fortis  ***refresh   Chief Complaint: ***  *** scheduled as a 6 mo  History of Present Illness: [name] is a 61 y.o. male with history of CAD (PCI 2009/CABG 2014), chronic CHF (systolic), ICM w/ICD, hx of pericarditis (2014), HTN, HLD, prior stroke    He was admitted 10/02/22 as a code stroke > TNK > developed worsening stroke symptoms > IR thrombectomy >> developed bleeding.  Cardiology brought on board 2/2 abnormal echo > redevelopment of pseudoaneurysm of the inferior wall that was repaired in 2014 ,  LV thrombus EP flagged for bacteremia in a patient with device. ID is on board [facility] positive blood cultures (staph epi) Pt was confused, not particularly interactive, and not felt a candidate for extraction of his device TEE noted a previously noted large pseudoanerysm measuring roughly 3 x 5 cm with a layer of thrombotic material.  LVEF is 30-35%  No evidence of endocarditis Discharged 11/16/22 to SNF On tube feeds DNR on OAC   *** HV therapies? DNR *** device only, needs cards f/u   Device information Abbott single chamber ICD implanted 05/25/20 [employer] w/7122 [employer] lead   Past Medical History:  Diagnosis Date   Abnormal EKG    hx of ischemia showing up on ekg's   Acute myopericarditis    a. 03/2013: readm with hypoxia, tachycardia, elevated ESR/CRP, elevated troponin with Dressler syndrome and myopericarditis; treated with steroids.   Acute respiratory failure (HCC)    a. 01/2013: VDRF. b. 03/2013: hypoxia requiring supp O2 during adm, resolved by discharge.   C. difficile colitis    a. 01/2013 during prolonged adm. b. Recurred 03/2013.   Cardiac tamponade    a. 01/2013 s/p drain.   Cataracts, bilateral    Coronary artery disease    a. s/p MI in 2009 in [place] with stenting  of the LCX and LAD;  b. 12/2012 NSTEMI/CAD: LM nl, LAD patent prox stent, LCX 50-70 isr (FFR 0.84), RCA dom, 143m, EF 40-45%-->Med Rx. c. 01/2013: anterolateral STEMI complicated by pericardial effusion (presumed purulent pericarditis) and tamponade s/p drain, ruptured LV pseudoaneurysm s/p CorMatrix patch, CABGx1 (SVG-OM1), fever, CVA, VDRF, C diff.   CVA (cerebral infarction)    a. 01/2013 in setting of prolonged hospitalization - residual L arm weakness.   Dressler syndrome (HCC)    a. 03/2013: readm with hypoxia, tachycardia, elevated ESR/CRP, elevated troponin with Dressler syndrome and myopericarditis; treated with steroids.   Glaucoma    Hyperlipidemia    Hypokalemia    Hyponatremia    a. During late 2014.   Ischemic cardiomyopathy    a. Sept/Oct 2014: EF ~40% (ICM). b. 03/2013: EF 25-30%. Off ACEI due to hypotension (MIXED NICM/ICM).   Optic neuropathy, ischemic    Pericardial effusion    a. 01/2013: pericardial effusion (presumed purulent pericarditis) and cardiac tamponade s/p drain. b. Persistent moderate pericardial effusion 03/2013.   Pre-diabetes    Pseudoaneurysm of left ventricle of heart    a. 01/2013:  Ruptured inferoposterior LV pseudoaneurysm s/p CorMatrix patch.   Sinus bradycardia    asymptomatic   Stroke [facility]) 2009   weak lt side-lt arm   Tobacco abuse    Wears dentures    top    Past Surgical History:  Procedure Laterality Date   CARDIAC CATHETERIZATION  01/06/2013  CATARACT EXTRACTION Bilateral 05/2021   COLONOSCOPY WITH PROPOFOL N/A 07/03/2016   Procedure: COLONOSCOPY WITH PROPOFOL;  Surgeon: Ruffin Frederick, MD;  Location: [facility] ENDOSCOPY;  Service: Gastroenterology;  Laterality: N/A;   CORONARY ANGIOPLASTY WITH STENT PLACEMENT  2000's X 2   "1 + 1" (01/06/2013)   CORONARY ARTERY BYPASS GRAFT N/A 01/28/2013   Procedure: CORONARY ARTERY BYPASS GRAFTING (CABG);  Surgeon: Loreli Slot, MD;  Location: [facility] OR;  Service: Open Heart Surgery;   Laterality: N/A;  CABG x one, using left greater saphenous vein harvested endoscopically   ICD IMPLANT N/A 05/25/2020   Procedure: ICD IMPLANT;  Surgeon: Hillis Range, MD;  [facility]: [facility] INVASIVE CV LAB;  Service: Cardiovascular;  Laterality: N/A;   INTRAOPERATIVE TRANSESOPHAGEAL ECHOCARDIOGRAM N/A 01/28/2013   Procedure: INTRAOPERATIVE TRANSESOPHAGEAL ECHOCARDIOGRAM;  Surgeon: Loreli Slot, MD;  Location: [facility] OR;  Service: Open Heart Surgery;  Laterality: N/A;   IR CT HEAD LTD  10/03/2022   IR PERCUTANEOUS ART THROMBECTOMY/INFUSION INTRACRANIAL INC DIAG ANGIO  10/03/2022   IR US GUIDE VASC ACCESS RIGHT  10/03/2022   LAPAROSCOPY N/A 10/24/2022   Procedure: LAPAROSCOPY ASSISTED GASTROSTOMY (PEG) PLACEMENT;  Surgeon: Diamantina Monks, MD;  Location: [facility] OR;  Service: General;  Laterality: N/A;   LEFT HEART CATH AND CORS/GRAFTS ANGIOGRAPHY N/A 09/13/2020   Procedure: LEFT HEART CATH AND CORS/GRAFTS ANGIOGRAPHY;  Surgeon: Swaziland, Peter M, MD;  [facility]: [facility] INVASIVE CV LAB;  Service: Cardiovascular;  Laterality: N/A;   LEFT HEART CATHETERIZATION WITH CORONARY ANGIOGRAM N/A 01/06/2013   Procedure: LEFT HEART CATHETERIZATION WITH CORONARY ANGIOGRAM;  Surgeon: Peter M Swaziland, MD;  Location: [facility] CATH LAB;  Service: Cardiovascular;  Laterality: N/A;   LEFT HEART CATHETERIZATION WITH CORONARY ANGIOGRAM N/A 01/21/2013   Procedure: LEFT HEART CATHETERIZATION WITH CORONARY ANGIOGRAM;  Surgeon: Kathleene Hazel, MD;  Location: [facility] CATH LAB;  Service: Cardiovascular;  Laterality: N/A;   LUMBAR LAMINECTOMY/ DECOMPRESSION WITH MET-RX Right 06/30/2018   Procedure: Right minimally invasive Lumbar Three-Four Far lateral discectomy;  Surgeon: Jadene Pierini, MD;  Location: [facility] OR;  Service: Neurosurgery;  Laterality: Right;  Right minimally invasive Lumbar Three-Four Far lateral discectomy   MASS EXCISION Right 07/21/2014   Procedure: EXCISION RIGHT NECK MASS;  Surgeon: Harriette Bouillon, MD;  Location: [facility]  ;  Service: General;  Laterality: Right;   PEG PLACEMENT N/A 10/24/2022   Procedure: ATTEMPTED PERCUTANEOUS ENDOSCOPIC GASTROSTOMY (PEG) PLACEMENT;  Surgeon: Diamantina Monks, MD;  Location: [facility] OR;  Service: General;  Laterality: N/A;   PERICARDIAL TAP N/A 01/24/2013   Procedure: PERICARDIAL TAP;  Surgeon: Micheline Chapman, MD;  Location: [facility] CATH LAB;  Service: Cardiovascular;  Laterality: N/A;   RADIOLOGY WITH ANESTHESIA N/A 10/03/2022   Procedure: IR WITH ANESTHESIA;  Surgeon: Julieanne Cotton, MD;  Location: [facility] OR;  Service: Radiology;  Laterality: N/A;   RIGHT HEART CATHETERIZATION Right 01/24/2013   Procedure: RIGHT HEART CATH;  Surgeon: Micheline Chapman, MD;  Location: [facility] CATH LAB;  Service: Cardiovascular;  Laterality: Right;   TEE WITHOUT CARDIOVERSION N/A 10/29/2022   Procedure: TRANSESOPHAGEAL ECHOCARDIOGRAM;  Surgeon: [name], MD;  [facility] INVASIVE CV LAB;  Service: Cardiovascular;  Laterality: N/A;   VENTRICULAR ANEURYSM RESECTION N/A 01/28/2013   Procedure: LEFT VENTRICULAR ANEURYSM REPAIR;  Surgeon: Loreli Slot, MD;  Location: [facility] OR;  Service: Open Heart Surgery;  Laterality: N/A;    Current Outpatient Medications  Medication Sig Dispense Refill   ALPHAGAN P 0.1 % SOLN INSTILL 1 DROP INTO  BOTH EYES TWICE A DAY (Patient taking differently: Place 1 drop into both eyes in the morning and at bedtime.) 10 mL 0   apixaban (ELIQUIS) 5 MG TABS tablet Take 1 tablet (5 mg total) by mouth 2 (two) times daily.     atorvastatin (LIPITOR) 80 MG tablet Take 1 tablet (80 mg total) by mouth daily. 90 tablet 3   carvedilol (COREG) 3.125 MG tablet Take 1 tablet (3.125 mg total) by mouth 2 (two) times daily with a meal.     Cholecalciferol (VITAMIN D3) 50 MCG (2000 UT) TABS Take 1 tablet by mouth daily.     cyclobenzaprine (FLEXERIL) 5 MG tablet Take 1 tablet (5 mg total) by mouth 3 (three) times daily as needed for muscle spasms. 15 tablet 0   dapagliflozin  propanediol (FARXIGA) 10 MG TABS tablet TAKE 1 TABLET EVERY DAY (Patient taking differently: Take 10 mg by mouth daily.) 90 tablet 3   docusate sodium (COLACE) 100 MG capsule Take 100 mg by mouth 2 (two) times daily.     famotidine (PEPCID) 20 MG tablet Take 1 tablet (20 mg total) by mouth 2 (two) times daily.     fiber supplement, BANATROL TF, liquid Place 60 mLs into feeding tube 2 (two) times daily.     FLUoxetine (PROZAC) 10 MG capsule Take 1 capsule (10 mg total) by mouth daily.     fluticasone (FLONASE) 50 MCG/ACT nasal spray Place 1 spray into both nostrils daily. 16 g 2   gabapentin (NEURONTIN) 250 MG/5ML solution Take 6 mLs (300 mg total) by mouth at bedtime.     latanoprost (XALATAN) 0.005 % ophthalmic solution Place 1 drop into both eyes at bedtime.  12   Multiple Vitamins-Minerals (CENTRUM MEN) TABS Take 1 tablet by mouth daily.     nitroGLYCERIN (NITROSTAT) 0.4 MG SL tablet Place 1 tablet (0.4 mg total) under the tongue every 5 (five) minutes x 3 doses as needed for chest pain. 25 tablet 1   polyethylene glycol (MIRALAX / GLYCOLAX) 17 g packet Take 17 g by mouth 2 (two) times daily as needed.     sacubitril-valsartan (ENTRESTO) 24-26 MG Take 1 tablet by mouth 2 (two) times daily.     spironolactone (ALDACTONE) 25 MG tablet Take 0.5 tablets (12.5 mg total) by mouth daily. 45 tablet 1   No current facility-administered medications for this visit.    Allergies:   Patient has no known allergies.   Social History:  The patient  reports that he has been smoking cigarettes. He has a 35 pack-year smoking history. He has never used smokeless tobacco. He reports that he does not drink alcohol and does not use drugs.   Family History:  The patient's family history includes CAD in an other family member; Diabetes in his mother; HIV in his brother; Heart attack in his mother; Hypertension in his father and mother.  ROS:  Please see the history of present illness.    All other systems are  reviewed and otherwise negative.   PHYSICAL EXAM:  VS:  There were no vitals taken for this visit. BMI: There is no height or weight on file to calculate BMI. Well nourished, well developed, in no acute distress HEENT: normocephalic, atraumatic Neck: no JVD, carotid bruits or masses Cardiac:  *** RRR; no significant murmurs, no rubs, or gallops Lungs:  *** CTA b/l, no wheezing, rhonchi or rales Abd: soft, nontender MS: no deformity or *** atrophy Ext: *** no edema Skin: warm and dry, no  rash Neuro:  No gross deficits appreciated Psych: euthymic mood, full affect  *** ICD site is stable, no tethering or discomfort   EKG:  Done today and reviewed by myself shows      Device interrogation done today and reviewed by myself:  ***   10/29/22: TEE No evidence for infective endocarditis. Moderate-sized PFO with spontaneous right to left flow noted No LAA thrombus Large pseudoaneurysm of the inferior wall, roughly 3 x 5 cm with layered thrombus (previously identified and repaired - CT demonstrated intact repair with thrombus on top of the repair). RV hypokinesis- mild to moderate LVEF 30-35%, mildly dilated LV with inferior and inferoseptal akinesis  Echocardiogram 10/07/2022 1. There is a large inferior pseudoaneurysm with a depth of 3 cm and a maximum width of roughly 5 cm. The "neck" at the level of the inferior wall measures 2.3 cm. The wall of the pseudoaneurysm is made up of a layer of thrombus that is roughly 1.5 cm thick. No mobile thrombus is seen.. Left ventricular ejection fraction, by estimation, is 30 to 35%. The left ventricle has moderately decreased function. The left ventricle demonstrates regional wall motion abnormalities (see scoring diagram/findings for description). The left ventricular internal cavity size was mildly dilated. There is akinesis of the left ventricular, basal-mid inferior wall and inferolateral wall. There is severe hypokinesis of the left ventricular,  basal-mid inferoseptal wall. There is severe hypokinesis of the left ventricular, basal-mid lateral wall. There is peudoaneurysm of the left ventricular, basal inferior wall. Conclusion(s)/Recommendation(s): Findings are highly concerning for redevelopment of pseudoaneurysm of the inferior wall that was repaired in 2014. There appears to be dehiscence of the patch used to correct abnormality, with development of new pseudoaneurysm. No protruding or mobile thrombi are seen in the pseudoaneurysm, but a thick layer of thrombus makes up the external wall of the pseudoaneurysm. Recommend CT angiography or MR angiography of the heart for precise definition.     CARDIAC CTA 10/10/2022 IMPRESSION: 1. 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 Large burden of thrombus on top of patch repair laminated but measuring as thick as 1.6 cm 3.  No pericardial effusion 4. Severe bi ventricular failure with D shaped septum indicating elevated PA pressures 5.  Occluded SVG to OM 6.  Pacing wires noted in RA/RV 7. Normal ascending thoracic aorta 3.4 cm with no evidence of dissection 8.  Chicken wing appendage with no thrombus present    Recent Labs: 10/16/2022: Magnesium 2.8 11/10/2022: ALT 30; BUN 12; Creatinine, Ser 0.78; Hemoglobin 12.3; Platelets 183; Potassium 4.3; Sodium 134  10/03/2022: Cholesterol 124; HDL 48; LDL Cholesterol 63; Total CHOL/HDL Ratio 2.6; Triglycerides 67; VLDL 13   CrCl cannot be calculated (Patient's most recent lab result is older than the maximum 21 days allowed.).   Wt Readings from Last 3 Encounters:  11/16/22 213 lb 10 oz (96.9 kg)  08/06/22 234 lb (106.1 kg)  07/23/22 233 lb (105.7 kg)     Other studies reviewed: Additional studies/records reviewed today include: summarized above  ASSESSMENT AND PLAN:  ICD ***  CAD ***  ICM Chronic CHF (systolic) ***  LV aneurysm Hx of prior patch repair (failed) LV  thrombus ***    Disposition: F/u with ***  Current medicines are reviewed at length with the patient today.  The patient did not have any concerns regarding medicines.  [name], PA-C 12/11/2022 12:30 PM     [facility] [address] [place] [address] [phone]  [phone] (office)  [phone] (fax)

## 2022-12-13 ENCOUNTER — Ambulatory Visit: Payer: Medicare PPO | Admitting: Physician Assistant

## 2022-12-14 ENCOUNTER — Encounter: Payer: Medicare HMO | Admitting: Cardiology

## 2022-12-20 ENCOUNTER — Other Ambulatory Visit: Payer: Self-pay

## 2022-12-20 MED ORDER — DAPAGLIFLOZIN PROPANEDIOL 10 MG PO TABS: 10.0000 mg | ORAL_TABLET | Freq: Every day | ORAL | 2 refills | Status: DC

## 2022-12-31 ENCOUNTER — Other Ambulatory Visit: Payer: Self-pay

## 2022-12-31 MED ORDER — DAPAGLIFLOZIN PROPANEDIOL 10 MG PO TABS: 10.0000 mg | ORAL_TABLET | Freq: Every day | ORAL | 3 refills | Status: AC

## 2022-12-31 NOTE — Progress Notes (Signed)
Unable to reach patient for monthly ICM remote follow up and not receiving monthly remote transmission.  Patient disenrolled due patient is not actively participating in [facility] clinic.  Device clinic 91 day remote monitoring will continue per protocol.   ?

## 2023-01-03 ENCOUNTER — Telehealth: Payer: Self-pay | Admitting: Adult Health

## 2023-01-03 ENCOUNTER — Encounter: Payer: Self-pay | Admitting: Adult Health

## 2023-01-03 NOTE — Telephone Encounter (Signed)
LVM, sent letter in mail, and mychart msg informing pt of appt change - pt needs to see Dr. Pearlean Brownie per Shanda Bumps

## 2023-01-07 ENCOUNTER — Ambulatory Visit: Payer: Medicare HMO | Admitting: Adult Health

## 2023-01-16 NOTE — Progress Notes (Unsigned)
Electrophysiology Office Note:   ID:  [name], DOB June 29, 1960, MRN [id number]  Primary Cardiologist: Peter Swaziland, MD Electrophysiologist: Regan Lemming, MD  {Click to update primary MD,subspecialty MD or APP then REFRESH:1}    History of Present Illness:   [name] is a 62 y.o. male with CAD s/p PCI x2 and hx ruptured pseudoaneurysm of LV s/p patch and CABG in 2014, sCHF EF 30-35%, HTN, HLD prior CVA, and h/o staph bacteremia seen today for post hospital follow up.    Admitted 6/18 - 8/2 with a very complicated admission. He presented with left hemiplegia, dysarthria, facial droop and found to have a stroke.  Remains in the hospital.  Hospital course complicated by hemorrhagic conversion of the stroke. Improving now.   During the hospitalization patient underwent an IR thrombectomy but the following day on 6/19 developed frontal IPH and SAH.  CT scan showed progression of hematoma, echocardiogram showed concerns of possible dehiscence of prior patch with adherent LV thrombus.  Hospital course was also complicated by MSSA bacteremia, EP was consulted but did not recommend ICD removal.  Patient also had a PEG tube placed.  Eventually ICH appeared to be stable on repeat CT scan, TEE was negative, antibiotics were stopped and Eliquis was started on 7/17.  Since discharge from hospital the patient reports doing ***.  he denies chest pain, palpitations, dyspnea, PND, orthopnea, nausea, vomiting, dizziness, syncope, edema, weight gain, or early satiety.   Review of systems complete and found to be negative unless listed in HPI.   EP Information / Studies Reviewed:    EKG is ordered today. Personal review as below.       ICD Interrogation-  reviewed in detail today,  See PACEART report.  Device History: Abbott Single Chamber ICD implanted 05/2020 for CHF   Physical Exam:   VS:  There were no vitals taken for this visit.   Wt Readings from Last 3 Encounters:  11/16/22  213 lb 10 oz (96.9 kg)  08/06/22 234 lb (106.1 kg)  07/23/22 233 lb (105.7 kg)     GEN: Well nourished, well developed in no acute distress NECK: No JVD; No carotid bruits CARDIAC: {EPRHYTHM:28826}, no murmurs, rubs, gallops RESPIRATORY:  Clear to auscultation without rales, wheezing or rhonchi  ABDOMEN: Soft, non-tender, non-distended EXTREMITIES:  No edema; No deformity   ASSESSMENT AND PLAN:    Chronic systolic dysfunction s/p {INDUSTRY:28136} {Blank single:19197::"***","single chamber ICD","dual chamber ICD","CRT-D","S-ICD"}  euvolemic today Stable on an appropriate medical regimen Normal ICD function See Pace Art report No changes today  Disposition:   Follow up with {EPPROVIDERS:28135} {EPFOLLOW UP:28173}   Signed, Graciella Freer, PA-C

## 2023-01-17 ENCOUNTER — Ambulatory Visit: Payer: Medicare PPO | Attending: Cardiology | Admitting: Student

## 2023-01-17 ENCOUNTER — Encounter: Payer: Self-pay | Admitting: Student

## 2023-01-17 VITALS — BP 102/68 | HR 92 | Ht 73.0 in | Wt 213.0 lb

## 2023-01-17 DIAGNOSIS — I5022 Chronic systolic (congestive) heart failure: Secondary | ICD-10-CM

## 2023-01-17 DIAGNOSIS — Z95810 Presence of automatic (implantable) cardiac defibrillator: Secondary | ICD-10-CM

## 2023-01-17 DIAGNOSIS — I2510 Atherosclerotic heart disease of native coronary artery without angina pectoris: Secondary | ICD-10-CM | POA: Diagnosis not present

## 2023-01-17 DIAGNOSIS — Z8673 Personal history of transient ischemic attack (TIA), and cerebral infarction without residual deficits: Secondary | ICD-10-CM

## 2023-01-17 DIAGNOSIS — I10 Essential (primary) hypertension: Secondary | ICD-10-CM

## 2023-01-17 DIAGNOSIS — I255 Ischemic cardiomyopathy: Secondary | ICD-10-CM

## 2023-01-17 LAB — CUP PACEART INCLINIC DEVICE CHECK
Battery Remaining Longevity: 97 mo
Brady Statistic RV Percent Paced: 0 %
Date Time Interrogation Session: 20241003123431
HighPow Impedance: 66.375
Implantable Lead Connection Status: 753985
Implantable Lead Implant Date: 20220209
Implantable Lead Location: 753860
Implantable Pulse Generator Implant Date: 20220209
Lead Channel Impedance Value: 337.5 Ohm
Lead Channel Pacing Threshold Amplitude: 3.25 V
Lead Channel Pacing Threshold Amplitude: 3.25 V
Lead Channel Pacing Threshold Pulse Width: 1 ms
Lead Channel Pacing Threshold Pulse Width: 1 ms
Lead Channel Sensing Intrinsic Amplitude: 9.4 mV
Lead Channel Setting Pacing Amplitude: 5 V
Lead Channel Setting Pacing Pulse Width: 1 ms
Lead Channel Setting Sensing Sensitivity: 0.5 mV
Pulse Gen Serial Number: 111033765
Zone Setting Status: 755011

## 2023-01-17 NOTE — Patient Instructions (Signed)
Medication Instructions:  Your physician recommends that you continue on your current medications as directed. Please refer to the Current Medication list given to you today.  *If you need a refill on your cardiac medications before your next appointment, please call your pharmacy*  Lab Work: None ordered If you have labs (blood work) drawn today and your tests are completely normal, you will receive your results only by: MyChart Message (if you have MyChart) OR A paper copy in the mail If you have any lab test that is abnormal or we need to change your treatment, we will call you to review the results.  Follow-Up: At [facility], you and your health needs are our priority.  As part of our continuing mission to provide you with exceptional heart care, we have created designated Provider Care Teams.  These Care Teams include your primary Cardiologist (physician) and Advanced Practice Providers (APPs -  Physician Assistants and Nurse Practitioners) who all work together to provide you with the care you need, when you need it.  Your next appointment:   3 month(s)  Provider:   Will Camnitz, MD  

## 2023-01-31 ENCOUNTER — Encounter: Payer: Self-pay | Admitting: Neurology

## 2023-01-31 ENCOUNTER — Ambulatory Visit: Payer: Medicare PPO | Admitting: Neurology

## 2023-01-31 VITALS — BP 108/76 | HR 90 | Ht 73.0 in

## 2023-01-31 DIAGNOSIS — G8110 Spastic hemiplegia affecting unspecified side: Secondary | ICD-10-CM

## 2023-01-31 DIAGNOSIS — I69320 Aphasia following cerebral infarction: Secondary | ICD-10-CM | POA: Diagnosis not present

## 2023-01-31 DIAGNOSIS — I63412 Cerebral infarction due to embolism of left middle cerebral artery: Secondary | ICD-10-CM | POA: Diagnosis not present

## 2023-01-31 NOTE — Patient Instructions (Addendum)
I had a long d/w patient about his recent cardiogenic strokes from LV thrombus , risk for recurrent stroke/TIAs, personally independently reviewed imaging studies and stroke evaluation results and answered questions.Continue Eliquis 5 mg twice daily for secondary stroke prevention for his LV thrombus and maintain strict control of hypertension with blood pressure goal below 130/90, diabetes with hemoglobin A1c goal below 6.5% and lipids with LDL cholesterol goal below 70 mg/dL. I also advised the patient to eat a healthy diet with plenty of whole grains, cereals, fruits and vegetables, exercise regularly and maintain ideal body weight .recommend continue ongoing physical occupational and speech therapy..  Patient was found to have positive right to left shunt on 2D echo but he has a low  ROPE score  of 2 hence will not suggest endovascular PFO closure at the present time.  Followup in the future with my nurse practitioner in 6 months or call earlier if necessary.

## 2023-01-31 NOTE — Progress Notes (Signed)
[facility] [address] [place]. [place] [address] [phone]       OFFICE FOLLOW-UP NOTE  [name] Date of Birth:  01/24/1961 Medical Record Number:  [id number]   HPI: [name] is a pleasant 62 year old African-American male seen today for initial office follow-up visit following hospital consultation for stroke in January 2024.  History is obtained from the patient and review of electronic medical records.  I personally reviewed pertinent available imaging films in PACS.  He has past medical history of bilateral cerebellar strokes, coronary artery disease, status post MI and stent, hyperlipidemia, smoking and using marijuana.  He presented on 05/04/2022 with sudden onset of nausea, disequilibrium and right hand incoordination he was unable to play the button on his phone with his right hand.  Symptoms resolved by the time of this imaging alone.  NIH stroke scale was 0.  CT head was unremarkable and CT angiogram showed no large vessel stenosis or occlusion.  MRI scan showed right inferior cerebellar small infarct.  2D echo showed ejection fraction of 35 to 40% with inferior lateral appendixes with mild LVH and grade 2 diastolic dysfunction.  Positive right to left shunting was noted with bubble injection but interatrial septum was not visualized.  LDL cholesterol 62 mg percent.  Hemoglobin A1c was 6.0.  Patient was counseled to quit smoking cigarettes and marijuana and switch from aspirin and Plavix to aspirin and Brilinta for 4 weeks followed by aspirin Plavix due to affordability of Brilinta long-term..  Patient states is done well since discharge.  He is right-handed coordination has improved.  He is not had any dizzy spells.  He has cut back smoking significantly and smokes about half pack per day.  He also uses marijuana very occasionally.  He had a 30-day heart monitor as an outpatient which showed no evidence of fibrillation.  Patient has never been evaluated  for sleep apnea but appears to be at risk.  He is tolerating Lipitor well without muscle aches and pains.  His blood pressure is well-controlled today it is 110/75.  He remains on aspirin and Plavix and is only minor bruising and no bleeding.  ROS:   14 system review of systems is positive for dizziness, vomiting, incoordination, gait imbalance and all other systems negative  PMH:  Past Medical History:  Diagnosis Date   Abnormal EKG    hx of ischemia showing up on ekg's   Acute myopericarditis    a. 03/2013: readm with hypoxia, tachycardia, elevated ESR/CRP, elevated troponin with Dressler syndrome and myopericarditis; treated with steroids.   Acute respiratory failure (HCC)    a. 01/2013: VDRF. b. 03/2013: hypoxia requiring supp O2 during adm, resolved by discharge.   C. difficile colitis    a. 01/2013 during prolonged adm. b. Recurred 03/2013.   Cardiac tamponade    a. 01/2013 s/p drain.   Cataracts, bilateral    Coronary artery disease    a. s/p MI in 2009 in [place] with stenting of the LCX and LAD;  b. 12/2012 NSTEMI/CAD: LM nl, LAD patent prox stent, LCX 50-70 isr (FFR 0.84), RCA dom, 121m, EF 40-45%-->Med Rx. c. 01/2013: anterolateral STEMI complicated by pericardial effusion (presumed purulent pericarditis) and tamponade s/p drain, ruptured LV pseudoaneurysm s/p CorMatrix patch, CABGx1 (SVG-OM1), fever, CVA, VDRF, C diff.   CVA (cerebral infarction)    a. 01/2013 in setting of prolonged hospitalization - residual L arm weakness.   Dressler syndrome (HCC)    a. 03/2013: readm with hypoxia,  tachycardia, elevated ESR/CRP, elevated troponin with Dressler syndrome and myopericarditis; treated with steroids.   Glaucoma    Hyperlipidemia    Hypokalemia    Hyponatremia    a. During late 2014.   Ischemic cardiomyopathy    a. Sept/Oct 2014: EF ~40% (ICM). b. 03/2013: EF 25-30%. Off ACEI due to hypotension (MIXED NICM/ICM).   Optic neuropathy, ischemic    Pericardial effusion    a. 01/2013:  pericardial effusion (presumed purulent pericarditis) and cardiac tamponade s/p drain. b. Persistent moderate pericardial effusion 03/2013.   Pre-diabetes    Pseudoaneurysm of left ventricle of heart    a. 01/2013:  Ruptured inferoposterior LV pseudoaneurysm s/p CorMatrix patch.   Sinus bradycardia    asymptomatic   Stroke [facility]) 2009   weak lt side-lt arm   Tobacco abuse    Wears dentures    top    Social History:  Social History   Socioeconomic History   Marital status: Single    Spouse name: Not on file   Number of children: 2   Years of education: 12th   Highest education level: Not on file  Occupational History   Occupation: N/A  Tobacco Use   Smoking status: Every Day    Current packs/day: 1.00    Average packs/day: 1 pack/day for 35.0 years (35.0 ttl pk-yrs)    Types: Cigarettes   Smokeless tobacco: Never  Vaping Use   Vaping status: Never Used  Substance and Sexual Activity   Alcohol use: No    Alcohol/week: 0.0 standard drinks of alcohol   Drug use: No   Sexual activity: Yes  Other Topics Concern   Not on file  Social History Narrative   Lives in [place] with wife.  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  He is now retired on disability.  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      Caffeine Use: very rarely   Social Determinants of Health   Financial Resource Strain: Not on file  Food Insecurity: Patient Unable To Answer (10/11/2022)   Hunger Vital Sign    Worried About Running Out of Food in the Last Year: Patient unable to answer    Ran Out of Food in the Last Year: Patient unable to answer  Transportation Needs: Patient Unable To Answer (10/11/2022)   PRAPARE - Transportation    Lack of Transportation (Medical): Patient unable to answer    Lack of Transportation (Non-Medical): Patient unable to answer  Physical Activity: Not on file  Stress: Not on file  Social Connections: Unknown (08/28/2021)   Received from [facility]   Social Network    Social Network: Not on file  Intimate Partner Violence: Patient Unable To Answer (10/11/2022)   Humiliation, Afraid, Rape, and Kick questionnaire    Fear of Current or Ex-Partner: Patient unable to answer    Emotionally Abused: Patient unable to answer    Physically Abused: Patient unable to answer    Sexually Abused: Patient unable to answer    Medications:   Current Outpatient Medications on File Prior to Visit  Medication Sig Dispense Refill   ALPHAGAN P 0.1 % SOLN INSTILL 1 DROP INTO BOTH EYES TWICE A DAY (Patient taking differently: Place 1 drop into both eyes in the morning and at bedtime.) 10 mL 0   apixaban (ELIQUIS) 5 MG TABS tablet Take 1 tablet (5 mg total) by mouth 2 (two) times daily.     atorvastatin (LIPITOR) 80 MG  tablet Take 1 tablet (80 mg total) by mouth daily. 90 tablet 3   carvedilol (COREG) 3.125 MG tablet Take 1 tablet (3.125 mg total) by mouth 2 (two) times daily with a meal.     Cholecalciferol (VITAMIN D3) 50 MCG (2000 UT) TABS Take 1 tablet by mouth daily.     cyclobenzaprine (FLEXERIL) 5 MG tablet Take 1 tablet (5 mg total) by mouth 3 (three) times daily as needed for muscle spasms. 15 tablet 0   dapagliflozin propanediol (FARXIGA) 10 MG TABS tablet Take 1 tablet (10 mg total) by mouth daily. 90 tablet 3   docusate sodium (COLACE) 100 MG capsule Take 100 mg by mouth 2 (two) times daily.     famotidine (PEPCID) 20 MG tablet Take 1 tablet (20 mg total) by mouth 2 (two) times daily.     fiber supplement, BANATROL TF, liquid Place 60 mLs into feeding tube 2 (two) times daily.     FLUoxetine (PROZAC) 10 MG capsule Take 1 capsule (10 mg total) by mouth daily.     fluticasone (FLONASE) 50 MCG/ACT nasal spray Place 1 spray into both nostrils daily. 16 g 2   gabapentin (NEURONTIN) 250 MG/5ML solution Take 6 mLs (300 mg total) by mouth at bedtime.     latanoprost (XALATAN) 0.005 % ophthalmic solution Place 1 drop into both eyes at bedtime.  12    Multiple Vitamins-Minerals (CENTRUM MEN) TABS Take 1 tablet by mouth daily.     nitroGLYCERIN (NITROSTAT) 0.4 MG SL tablet Place 1 tablet (0.4 mg total) under the tongue every 5 (five) minutes x 3 doses as needed for chest pain. 25 tablet 1   polyethylene glycol (MIRALAX / GLYCOLAX) 17 g packet Take 17 g by mouth 2 (two) times daily as needed.     sacubitril-valsartan (ENTRESTO) 24-26 MG Take 1 tablet by mouth 2 (two) times daily.     spironolactone (ALDACTONE) 25 MG tablet Take 0.5 tablets (12.5 mg total) by mouth daily. 45 tablet 1   No current facility-administered medications on file prior to visit.    Allergies:  No Known Allergies  Physical Exam General: Mildly obese middle-aged African-American male, seated, in no evident distress Head: head normocephalic and atraumatic.  Neck: supple with no carotid or supraclavicular bruits Cardiovascular: regular rate and rhythm, no murmurs Musculoskeletal: no deformity Skin:  no rash/petichiae Vascular:  Normal pulses all extremities Vitals:   01/31/23 0751  BP: 108/76  Pulse: 90   Neurologic Exam Mental Status: Awake and fully alert. Oriented to place and time. Recent and remote memory intact. Attention span, concentration and fund of knowledge appropriate. Mood and affect appropriate.  Cranial Nerves: Fundoscopic exam reveals sharp disc margins. Pupils equal, briskly reactive to light. Extraocular movements full without nystagmus.  Decreased vision bilaterally left side greater than right hearing intact. Facial sensation intact. Face, tongue, palate moves normally and symmetrically.  Motor: Normal bulk and tone. Normal strength in all tested extremity muscles. Sensory.: intact to touch ,pinprick .position and vibratory sensation.  Coordination: Rapid alternating movements normal in all extremities. Finger-to-nose and heel-to-shin performed accurately bilaterally. Gait and Station: Arises from chair without difficulty. Stance is normal. Gait  demonstrates normal stride length and balance . Unable to heel, toe and tandem walk  .  Reflexes: 1+ and symmetric. Toes downgoing.   NIHSS  0 Modified Rankin  1   ASSESSMENT: 62 year old African-American male with right cerebellar infarct and January 2024 of cryptogenic etiology.  Vascular risk factors of coronary artery disease, cardiomyopathy,  hyperlipidemia, hypertension, diabetes, at risk for sleep apnea, smoking cigarettes and marijuana.     PLAN:I had a long d/w patient about his recent cardiogenic strokes from LV thrombus , risk for recurrent stroke/TIAs, personally independently reviewed imaging studies and stroke evaluation results and answered questions.Continue Eliquis 5 mg twice daily for secondary stroke prevention for his LV thrombus and maintain strict control of hypertension with blood pressure goal below 130/90, diabetes with hemoglobin A1c goal below 6.5% and lipids with LDL cholesterol goal below 70 mg/dL. I also advised the patient to eat a healthy diet with plenty of whole grains, cereals, fruits and vegetables, exercise regularly and maintain ideal body weight .recommend continue ongoing physical occupational and speech therapy..  Patient was found to have positive right to left shunt on 2D echo but he has a low  ROPE score  of 2 and poor functional status at baseline now hence will not suggest endovascular PFO closure at the present time.  Followup in the future with my nurse practitioner in 6 months or call earlier if necessary. Refer to Dr Bedelia Person for peg tube removal. Greater than 50% time during counseling and coordination of care about her cryptogenic stroke and questionable history of documented Greater than 50% of time during this 35 minute visit was spent on counseling,explanation of diagnosis, planning of further management, discussion with patient and family and coordination of care Robert Heady, MD Note: This document was prepared with digital dictation and possible  smart phrase technology. Any transcriptional errors that result from this process are unintentional

## 2023-02-22 ENCOUNTER — Other Ambulatory Visit: Payer: Self-pay

## 2023-02-22 NOTE — Patient Outreach (Signed)
No Telephone outreach to patient needed. Dr. Pearlean Brownie obtained mRS successfully 01/31/23. MRS= 1  Vanice Sarah [facility] Care Management Assistant [phone]

## 2023-04-30 ENCOUNTER — Encounter: Payer: Medicare PPO | Admitting: Cardiology

## 2023-04-30 NOTE — Progress Notes (Signed)
  Electrophysiology Office Note:   Date:  05/01/2023  ID:  [name], DOB [birth year], MRN [id number]  Primary Cardiologist: Robert Swaziland, MD Primary Heart Failure: None Electrophysiologist: Robert Cortland Ding, MD       History of Present Illness:   [name] is a 63 y.o. male with h/o CHB s/p PPM, CAD s/p PCI x2, ruptured pseudoaneurysm of LV s/p patch and CABG 2014, HFrEF, HTN, HLD, CVA (ischemic with hemorrhagic conversion), hx of questionable staph epi bacteremia (thought to be contaminant) seen today for routine electrophysiology followup.   Since last being seen in our clinic the patient reports he continues to work with rehab at [facility]. He has made strides in recovery.  His POA is with him today and she reports he is eating well without his PEG tube.  She wonders who would take it out as it was placed while he was in the hospital.    He denies chest pain, palpitations, dyspnea, PND, orthopnea, nausea, vomiting, dizziness, syncope, edema, weight gain, or early satiety.   Review of systems complete and found to be negative unless listed in HPI.    EP Information / Studies Reviewed:    EKG is not ordered today. EKG from 01/17/2023 reviewed which showed SR with 92 bpm with 1st degree AVB, LAD      ICD Interrogation-  reviewed in detail today,  See PACEART report.  Device History: Abbott Single Chamber ICD implanted 05/25/2020 for CHF History of appropriate therapy: No History of AAD therapy: No           Physical Exam:   VS:  BP 116/64   Pulse 68   Ht 5\' 7"  (1.702 m)   Wt 214 lb (97.1 kg)   SpO2 95%   BMI 33.52 kg/m    Wt Readings from Last 3 Encounters:  05/01/23 214 lb (97.1 kg)  01/17/23 213 lb (96.6 kg)  11/16/22 213 lb 10 oz (96.9 kg)     GEN: Well nourished, well developed in no acute distress NECK: No JVD; No carotid bruits CARDIAC: Regular rate and rhythm, no murmurs, rubs, gallops RESPIRATORY:  Clear to auscultation without rales, wheezing  or rhonchi  ABDOMEN: Soft, non-tender, non-distended EXTREMITIES:  No edema; No deformity   ASSESSMENT AND PLAN:    Chronic Systolic Dysfunction s/p Abbott single chamber ICD  -euvolemic today -Stable on an appropriate medical regimen -Normal ICD function -See Pace Art report -No changes today -GDMT per Cardiology  -Industry reaching out to get him a new receiver at the facility as he does not have his cell phone anymore > going to be mailed to his POA, [name]  -known chronically elevated RV threshold, pulse width previously programmed to 1 for extra safety margin in the event he were to require pacing    CAD  -no anginal symptoms   Hx CVA  Secondary Hypercoagulable State -lifelong anticoagulation  -continue Eliquis  5mg  BID  Hx LV Thrombus, LV Pseudoaneurysm  -OAC as above     Disposition:   Follow up with Dr. Lawana Tucker in 12 months   Signed, Robert Doffing, MSN, APRN, NP-C, AGACNP-BC [facility] - Electrophysiology  05/01/2023, 4:15 PM

## 2023-05-01 ENCOUNTER — Ambulatory Visit: Payer: Medicare PPO | Attending: Pulmonary Disease | Admitting: Pulmonary Disease

## 2023-05-01 ENCOUNTER — Encounter: Payer: Self-pay | Admitting: Pulmonary Disease

## 2023-05-01 VITALS — BP 116/64 | HR 68 | Ht 67.0 in | Wt 214.0 lb

## 2023-05-01 DIAGNOSIS — I10 Essential (primary) hypertension: Secondary | ICD-10-CM | POA: Diagnosis not present

## 2023-05-01 DIAGNOSIS — Z95810 Presence of automatic (implantable) cardiac defibrillator: Secondary | ICD-10-CM | POA: Diagnosis not present

## 2023-05-01 DIAGNOSIS — I2510 Atherosclerotic heart disease of native coronary artery without angina pectoris: Secondary | ICD-10-CM

## 2023-05-01 DIAGNOSIS — I5022 Chronic systolic (congestive) heart failure: Secondary | ICD-10-CM | POA: Diagnosis not present

## 2023-05-01 LAB — CUP PACEART INCLINIC DEVICE CHECK
Battery Remaining Longevity: 94 mo
Brady Statistic RV Percent Paced: 0 %
Date Time Interrogation Session: 20250115161612
HighPow Impedance: 67.5 Ohm
Implantable Lead Connection Status: 753985
Implantable Lead Implant Date: 20220209
Implantable Lead Location: 753860
Implantable Pulse Generator Implant Date: 20220209
Lead Channel Impedance Value: 362.5 Ohm
Lead Channel Pacing Threshold Amplitude: 3.5 V
Lead Channel Pacing Threshold Amplitude: 3.5 V
Lead Channel Pacing Threshold Pulse Width: 1 ms
Lead Channel Pacing Threshold Pulse Width: 1 ms
Lead Channel Sensing Intrinsic Amplitude: 6.4 mV
Lead Channel Setting Pacing Amplitude: 5 V
Lead Channel Setting Pacing Pulse Width: 1 ms
Lead Channel Setting Sensing Sensitivity: 0.5 mV
Pulse Gen Serial Number: 111033765
Zone Setting Status: 755011

## 2023-05-01 NOTE — Patient Instructions (Signed)
 Medication Instructions:  Your physician recommends that you continue on your current medications as directed. Please refer to the Current Medication list given to you today.  *If you need a refill on your cardiac medications before your next appointment, please call your pharmacy*  Lab Work: None ordered If you have labs (blood work) drawn today and your tests are completely normal, you will receive your results only by: MyChart Message (if you have MyChart) OR A paper copy in the mail If you have any lab test that is abnormal or we need to change your treatment, we will call you to review the results.   Follow-Up: At [facility], you and your health needs are our priority.  As part of our continuing mission to provide you with exceptional heart care, we have created designated Provider Care Teams.  These Care Teams include your primary Cardiologist (physician) and Advanced Practice Providers (APPs -  Physician Assistants and Nurse Practitioners) who all work together to provide you with the care you need, when you need it.  Your next appointment:   1 year(s)  Provider:   You may see Will Jorja Loa, MD or one of the following Advanced Practice Providers on your designated Care Team:   Francis Dowse, [place] [address] [place], [place] Sherie Don, NP Canary Brim, NP

## 2023-08-14 ENCOUNTER — Ambulatory Visit (INDEPENDENT_AMBULATORY_CARE_PROVIDER_SITE_OTHER): Payer: Medicare PPO | Admitting: Adult Health

## 2023-08-14 ENCOUNTER — Encounter: Payer: Self-pay | Admitting: Adult Health

## 2023-08-14 VITALS — BP 113/75 | HR 74 | Ht 73.0 in

## 2023-08-14 DIAGNOSIS — I639 Cerebral infarction, unspecified: Secondary | ICD-10-CM | POA: Diagnosis not present

## 2023-08-14 DIAGNOSIS — I69322 Dysarthria following cerebral infarction: Secondary | ICD-10-CM | POA: Diagnosis not present

## 2023-08-14 DIAGNOSIS — G8191 Hemiplegia, unspecified affecting right dominant side: Secondary | ICD-10-CM | POA: Diagnosis not present

## 2023-08-14 DIAGNOSIS — I63412 Cerebral infarction due to embolism of left middle cerebral artery: Secondary | ICD-10-CM | POA: Diagnosis not present

## 2023-08-14 DIAGNOSIS — I513 Intracardiac thrombosis, not elsewhere classified: Secondary | ICD-10-CM

## 2023-08-14 NOTE — Progress Notes (Signed)
 [facility] [address] [place]. [place] [address] [phone]       OFFICE FOLLOW-UP NOTE  [name] Date of Birth:  May 03, 1960 Medical Record Number:  [id number]    Primary neurologist: Dr. Janett Tucker Reason for visit: Stroke follow-up   Chief Complaint  Patient presents with   Cerebrovascular Accident    Rm 3 alone Pt is well and stable, reports no new concerns since last visit.       HPI:   Update 08/14/2023 JM: Patient returns for follow-up visit unaccompanied.  He was previously seen by Dr. Janett Tucker 01/31/2023 for hospital stroke follow-up in 09/2022 (although HPI not updated from 07/2022 visit) after presenting with left hemiplegia, dysarthria and facial droop, admitted for right brain TIA aborted by TNK and later that day had second stroke with left MCA and right cerebellar infarcts in setting of M2 occlusion s/p IR, complicated by left frontal parietal ICH and SAH.  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 Hospital course was also complicated by MSSA bacteremia, EP was consulted but did not recommend ICD removal. Patient also had a PEG tube placed. Eventually ICH appeared to be stable on repeat CT scan, TEE was negative, antibiotics were stopped and Eliquis  was started on 7/17.  It was recommended to follow-up with cardiothoracic surgery for consideration of repeat repair if surgical candidate.  He was ultimately discharged to SNF.  Currently, he continues to reside at [facility].  Reports overall doing well since prior visit.  Continues to work with PT, feels like side is gradually getting stronger.  Ambulates short distance with PT but otherwise transfers via wheelchair.  He continues to struggle with his speech, denies currently working with SLP.  He did have PEG tube removed on 4/17, reports  maintaining regular diet and swallowing pills without difficulty.  No new stroke/TIA symptoms.  Per SNF MAR, remains on Eliquis  and atorvastatin .  Routinely followed by SNF [facility] for stroke risk factor management.  Recent visit with cardiology 04/2023 and recommended 1 year follow-up.  No questions or concerns at this time.       History provided for reference purposes only Initial visit 08/06/2022 Dr. Janett Tucker: [name] is a pleasant 63 year old African-American male seen today for initial office follow-up visit following hospital consultation for stroke in January 2024.  History is obtained from the patient and review of electronic medical records.  I personally reviewed pertinent available imaging films in PACS.  He has past medical history of bilateral cerebellar strokes, coronary artery disease, status post MI and stent, hyperlipidemia, smoking and using marijuana.  He presented on 05/04/2022 with sudden onset of nausea, disequilibrium and right hand incoordination he was unable to play the button on his phone with his right hand.  Symptoms resolved by the time of this imaging alone.  NIH stroke scale was 0.  CT head was unremarkable and CT angiogram showed no large vessel stenosis or occlusion.  MRI scan showed right inferior cerebellar small infarct.  2D echo showed ejection fraction of 35 to 40% with inferior lateral appendixes with mild LVH and grade 2 diastolic dysfunction.  Positive right to left shunting was noted with bubble injection but interatrial septum was not visualized.  LDL cholesterol 62 mg percent.  Hemoglobin A1c was 6.0.  Patient was counseled to quit smoking cigarettes and marijuana and switch  from aspirin  and Plavix  to aspirin  and Brilinta  for 4 weeks followed by aspirin  Plavix  due to affordability of Brilinta  long-term..  Patient states is done well since discharge.  He is right-handed coordination has improved.  He is not had any dizzy spells.  He has cut back smoking significantly  and smokes about half pack per day.  He also uses marijuana very occasionally.  He had a 30-day heart monitor as an outpatient which showed no evidence of fibrillation.  Patient has never been evaluated for sleep apnea but appears to be at risk.  He is tolerating Lipitor  well without muscle aches and pains.  His blood pressure is well-controlled today it is 110/75.  He remains on aspirin  and Plavix  and is only minor bruising and no bleeding.  ROS:   14 system review of systems is positive for those listed in HPI and all other systems negative  PMH:  Past Medical History:  Diagnosis Date   Abnormal EKG    hx of ischemia showing up on ekg's   Acute myopericarditis    a. 03/2013: readm with hypoxia, tachycardia, elevated ESR/CRP, elevated troponin with Dressler syndrome and myopericarditis; treated with steroids.   Acute respiratory failure (HCC)    a. 01/2013: VDRF. b. 03/2013: hypoxia requiring supp O2 during adm, resolved by discharge.   C. difficile colitis    a. 01/2013 during prolonged adm. b. Recurred 03/2013.   Cardiac tamponade    a. 01/2013 s/p drain.   Cataracts, bilateral    Coronary artery disease    a. s/p MI in 2009 in [place] with stenting of the LCX and LAD;  b. 12/2012 NSTEMI/CAD: LM nl, LAD patent prox stent, LCX 50-70 isr (FFR 0.84), RCA dom, 1108m, EF 40-45%-->Med Rx. c. 01/2013: anterolateral STEMI complicated by pericardial effusion (presumed purulent pericarditis) and tamponade s/p drain, ruptured LV pseudoaneurysm s/p CorMatrix patch, CABGx1 (SVG-OM1), fever, CVA, VDRF, C diff.   CVA (cerebral infarction)    a. 01/2013 in setting of prolonged hospitalization - residual L arm weakness.   Dressler syndrome (HCC)    a. 03/2013: readm with hypoxia, tachycardia, elevated ESR/CRP, elevated troponin with Dressler syndrome and myopericarditis; treated with steroids.   Glaucoma    Hyperlipidemia    Hypokalemia    Hyponatremia    a. During late 2014.   Ischemic cardiomyopathy     a. Sept/Oct 2014: EF ~40% (ICM). b. 03/2013: EF 25-30%. Off ACEI due to hypotension (MIXED NICM/ICM).   Optic neuropathy, ischemic    Pericardial effusion    a. 01/2013: pericardial effusion (presumed purulent pericarditis) and cardiac tamponade s/p drain. b. Persistent moderate pericardial effusion 03/2013.   Pre-diabetes    Pseudoaneurysm of left ventricle of heart    a. 01/2013:  Ruptured inferoposterior LV pseudoaneurysm s/p CorMatrix patch.   Sinus bradycardia    asymptomatic   Stroke [facility]) 2009   weak lt side-lt arm   Tobacco abuse    Wears dentures    top    Social History:  Social History   Socioeconomic History   Marital status: Single    Spouse name: Not on file   Number of children: 2   Years of education: 12th   Highest education level: Not on file  Occupational History   Occupation: N/A  Tobacco Use   Smoking status: Every Day    Current packs/day: 1.00    Average packs/day: 1 pack/day for 35.0 years (35.0 ttl pk-yrs)    Types: Cigarettes   Smokeless tobacco: Never  Vaping Use   Vaping status: Never Used  Substance and Sexual Activity   Alcohol use: No    Alcohol/week: 0.0 standard drinks of alcohol   Drug use: No   Sexual activity: Yes  Other Topics Concern   Not on file  Social History Narrative   Lives in [place] with wife.  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  He is now retired on disability.  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      Caffeine Use: very rarely   Social Drivers of Corporate investment banker Strain: Not on file  Food Insecurity: Patient Unable To Answer (10/11/2022)   Hunger Vital Sign    Worried About Running Out of Food in the Last Year: Patient unable to answer    Ran Out of Food in the Last Year: Patient unable to answer  Transportation Needs: Patient Unable To Answer (10/11/2022)   PRAPARE - Transportation    Lack of Transportation (Medical): Patient unable to answer    Lack of Transportation  (Non-Medical): Patient unable to answer  Physical Activity: Not on file  Stress: Not on file  Social Connections: Unknown (08/28/2021)   Received from [facility], [facility]   Social Network    Social Network: Not on file  Intimate Partner Violence: Patient Unable To Answer (10/11/2022)   Humiliation, Afraid, Rape, and Kick questionnaire    Fear of Current or Ex-Partner: Patient unable to answer    Emotionally Abused: Patient unable to answer    Physically Abused: Patient unable to answer    Sexually Abused: Patient unable to answer    Medications:   Current Outpatient Medications on File Prior to Visit  Medication Sig Dispense Refill   ALPHAGAN  P 0.1 % SOLN INSTILL 1 DROP INTO BOTH EYES TWICE A DAY (Patient taking differently: Place 1 drop into both eyes in the morning and at bedtime.) 10 mL 0   apixaban  (ELIQUIS ) 5 MG TABS tablet Take 1 tablet (5 mg total) by mouth 2 (two) times daily.     atorvastatin  (LIPITOR ) 80 MG tablet Take 1 tablet (80 mg total) by mouth daily. 90 tablet 3   carvedilol  (COREG ) 3.125 MG tablet Take 1 tablet (3.125 mg total) by mouth 2 (two) times daily with a meal.     Cholecalciferol  (VITAMIN D3) 50 MCG (2000 UT) TABS Take 1 tablet by mouth daily.     cyclobenzaprine  (FLEXERIL ) 5 MG tablet Take 1 tablet (5 mg total) by mouth 3 (three) times daily as needed for muscle spasms. 15 tablet 0   dapagliflozin  propanediol (FARXIGA ) 10 MG TABS tablet Take 1 tablet (10 mg total) by mouth daily. 90 tablet 3   docusate sodium  (COLACE) 100 MG capsule Take 100 mg by mouth 2 (two) times daily.     famotidine  (PEPCID ) 20 MG tablet Take 1 tablet (20 mg total) by mouth 2 (two) times daily.     fiber supplement, BANATROL TF, liquid Place 60 mLs into feeding tube 2 (two) times daily.     FLUoxetine  (PROZAC ) 10 MG capsule Take 1 capsule (10 mg total) by mouth daily.     fluticasone  (FLONASE ) 50 MCG/ACT nasal spray Place 1 spray into both nostrils daily. 16 g 2   gabapentin   (NEURONTIN ) 250 MG/5ML solution Take 6 mLs (300 mg total) by mouth at bedtime.     latanoprost  (XALATAN ) 0.005 % ophthalmic solution Place 1 drop into both eyes at bedtime.  12   Multiple  Vitamins-Minerals (CENTRUM MEN) TABS Take 1 tablet by mouth daily.     nitroGLYCERIN  (NITROSTAT ) 0.4 MG SL tablet Place 1 tablet (0.4 mg total) under the tongue every 5 (five) minutes x 3 doses as needed for chest pain. 25 tablet 1   ondansetron  (ZOFRAN ) 4 MG tablet Take 4 mg by mouth every 8 (eight) hours as needed for nausea or vomiting.     polyethylene glycol (MIRALAX  / GLYCOLAX ) 17 g packet Take 17 g by mouth 2 (two) times daily as needed.     saccharomyces boulardii (FLORASTOR) 250 MG capsule Take 250 mg by mouth 2 (two) times daily.     sacubitril -valsartan  (ENTRESTO ) 24-26 MG Take 1 tablet by mouth 2 (two) times daily.     spironolactone  (ALDACTONE ) 25 MG tablet Take 0.5 tablets (12.5 mg total) by mouth daily. 45 tablet 1   No current facility-administered medications on file prior to visit.    Allergies:  No Known Allergies  Physical Exam Today's Vitals   08/14/23 0830  BP: 113/75  Pulse: 74  Height: 6\' 1"  (1.854 m)   Body mass index is 28.23 kg/m.  General: Mildly obese very pleasant middle-aged African-American male, seated, in no evident distress  Neurologic Exam Mental Status: Awake and fully alert.  Moderate to severe dysarthria, able to understand short simple sentences with some difficulty.  Oriented to place and time. Recent and remote memory intact. Attention span, concentration and fund of knowledge appropriate. Mood and affect appropriate.  Cranial Nerves: Pupils equal, briskly reactive to light. Extraocular movements full without nystagmus.  Deficits in bilateral lower visual fields.  Hearing intact. Facial sensation intact.  Slight right lower facial weakness.  Tongue, palate moves normally and symmetrically.  Motor: Normal strength, bulk and tone left upper and lower extremity.   RUE: 4/5 with decreased grip strength and hand dexterity, increased tone in hand. RLE: 4/5 throughout Sensory.:  Decreased sensation RUE>RLE compared to left side Coordination: Rapid alternating movements normal on left side. Finger-to-nose and heel-to-shin performed accurately on left side. Gait and Station: Deferred Reflexes: 1+ and symmetric. Toes downgoing.      ASSESSMENT: 64 year old African-American male with right brain TIA s/p TNK and subsequent left MCA and right cerebellar infarcts s/p IR in 09/2022 complicated by left frontoparietal ICH and SAH with evidence of LV thrombus over patch repair from prior LV pseudoaneurysm rupture in 2014.  History of right cerebellar infarct and January 2024 of cryptogenic etiology.  Evidence of PFO but low rope score and poor functional status therefore did not recommend pursuing PFO closure.  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     PLAN:  1.  Right cerebellar stroke  -Residual deficits: Mild right sided weakness, sensory impairment, dysarthria and gait impairment. Continue working with PT at [facility], consider restart of SLP for persistent speech difficulties.  S/p PEG removal 07/2023, tolerating regular diet without difficulty - Continue Eliquis  5 mg twice daily for secondary stroke prevention and for LV thrombus managed/prescribed by cardiology  - Continue atorvastatin  80 mg daily for secondary stroke prevention managed/prescribed by PCP  - Continue close PCP follow-up for aggressive stroke risk factor management  2.  Large inferior cardiac pseudoaneurysm 3.  LV thrombus  - Per cardiology, recommend lifelong anticoagulation for stroke prevention  -prior cardiology follow-up 04/2023, recommended 24-month  follow-up with Dr. Lawana Pray     Follow-up in 9 months or call earlier if needed    I spent 40 minutes of face-to-face and non-face-to-face time with patient.  This included previsit chart review including review of prior hospitalizations, lab review, study review, order entry, electronic health record documentation, patient education and discussion regarding above diagnoses and treatment plan and answered all other questions to patient's satisfaction  Johny Nap, [facility]  [facility] [address] [place], [place] [address]  Phone [phone] Fax [phone] Note: This document was prepared with digital dictation and possible smart phrase technology. Any transcriptional errors that result from this process are unintentional.

## 2023-08-14 NOTE — Patient Instructions (Addendum)
 Your Plan:  Continue working with physical therapy  Consider restart of speech therapy for continued speech/language difficulties   Continue to follow with PCP and cardiology as advised  Continue current medications for stroke prevention     Follow up in 9 months or call earlier if needed     Thank you for coming to see us  at [facility]. I hope we have been able to provide you high quality care today.  You may receive a patient satisfaction survey over the next few weeks. We would appreciate your feedback and comments so that we may continue to improve ourselves and the health of our patients.

## 2023-08-21 ENCOUNTER — Other Ambulatory Visit: Payer: Self-pay | Admitting: Cardiology

## 2023-09-18 ENCOUNTER — Telehealth: Payer: Self-pay

## 2023-09-18 NOTE — Telephone Encounter (Signed)
 Patient has been in a nursing facility as of 11/2022.  Apparently they did not have his remote monitor and he has not been being followed.  Nursing facility is asking that we send a new remote monitor and coordinate with them how to set this up.   The main number for the patient listed, should be for the facility.  Thanks.

## 2023-09-19 NOTE — Telephone Encounter (Signed)
 Chris from [facility] is sending the pt a new monitor.

## 2023-09-26 ENCOUNTER — Telehealth: Payer: Self-pay

## 2023-09-26 NOTE — Telephone Encounter (Signed)
 Alert received from [employer] for HF diagnostics currently abnormal.    Looks patient patient is no longer enrolled in [facility] clinic d/t unable to get in touch with. Wanted to see about enrolling since we have been able to contact pt recently about remote monitor.

## 2023-10-02 NOTE — Telephone Encounter (Signed)
 Attempted ICM call to patient and home number is [facility].  Asked to speak with patient and transferred by no answer.  Review of 08/14/2023 Neurology note and patient has difficulty with speech.  Will continue to monitor every 3 months.   Per [facility] thoracic impedance fluid levels returned to normal 6/15.

## 2023-10-11 ENCOUNTER — Encounter (HOSPITAL_COMMUNITY): Payer: Self-pay | Admitting: Interventional Radiology

## 2023-11-20 ENCOUNTER — Ambulatory Visit (INDEPENDENT_AMBULATORY_CARE_PROVIDER_SITE_OTHER): Payer: Medicare HMO

## 2023-11-20 ENCOUNTER — Ambulatory Visit: Payer: Self-pay | Admitting: Cardiology

## 2023-11-20 DIAGNOSIS — I255 Ischemic cardiomyopathy: Secondary | ICD-10-CM | POA: Diagnosis not present

## 2023-11-20 LAB — CUP PACEART REMOTE DEVICE CHECK
Battery Remaining Longevity: 70 mo
Battery Remaining Percentage: 70 %
Battery Voltage: 2.99 V
Brady Statistic RV Percent Paced: 1 %
Date Time Interrogation Session: 20250806020239
HighPow Impedance: 69 Ohm
Implantable Lead Connection Status: 753985
Implantable Lead Implant Date: 20220209
Implantable Lead Location: 753860
Implantable Pulse Generator Implant Date: 20220209
Lead Channel Impedance Value: 390 Ohm
Lead Channel Pacing Threshold Amplitude: 3.5 V
Lead Channel Pacing Threshold Pulse Width: 1 ms
Lead Channel Sensing Intrinsic Amplitude: 5.6 mV
Lead Channel Setting Pacing Amplitude: 5 V
Lead Channel Setting Pacing Pulse Width: 1 ms
Lead Channel Setting Sensing Sensitivity: 0.5 mV
Pulse Gen Serial Number: 111033765
Zone Setting Status: 755011

## 2024-01-13 NOTE — Progress Notes (Signed)
 Remote ICD Transmission

## 2024-02-19 ENCOUNTER — Ambulatory Visit (INDEPENDENT_AMBULATORY_CARE_PROVIDER_SITE_OTHER): Payer: Medicare HMO

## 2024-02-19 DIAGNOSIS — I255 Ischemic cardiomyopathy: Secondary | ICD-10-CM | POA: Diagnosis not present

## 2024-02-20 LAB — CUP PACEART REMOTE DEVICE CHECK
Battery Remaining Longevity: 66 mo
Battery Remaining Percentage: 68 %
Battery Voltage: 2.99 V
Brady Statistic RV Percent Paced: 1 %
Date Time Interrogation Session: 20251105020155
HighPow Impedance: 65 Ohm
Implantable Lead Connection Status: 753985
Implantable Lead Implant Date: 20220209
Implantable Lead Location: 753860
Implantable Pulse Generator Implant Date: 20220209
Lead Channel Impedance Value: 340 Ohm
Lead Channel Pacing Threshold Amplitude: 3.5 V
Lead Channel Pacing Threshold Pulse Width: 1 ms
Lead Channel Sensing Intrinsic Amplitude: 8.7 mV
Lead Channel Setting Pacing Amplitude: 5 V
Lead Channel Setting Pacing Pulse Width: 1 ms
Lead Channel Setting Sensing Sensitivity: 0.5 mV
Pulse Gen Serial Number: 111033765
Zone Setting Status: 755011

## 2024-02-20 NOTE — Progress Notes (Signed)
 Remote ICD Transmission

## 2024-02-25 ENCOUNTER — Ambulatory Visit: Payer: Self-pay | Admitting: Cardiology

## 2024-05-12 NOTE — Progress Notes (Unsigned)
 " [facility] [address] [place]. [place] [address] [phone]       OFFICE FOLLOW-UP NOTE  [name] Date of Birth:  19-Jun-1960 Medical Record Number:  [id number]    Primary neurologist: Dr. Rosemarie Reason for visit: Stroke follow-up   No chief complaint on file.     HPI:   Update 05/13/2024 JM:  returns for follow up visit. Stable from stroke standpoint without new stroke/TIA symptoms.          History provided for reference purposes only Update 08/14/2023 JM: Patient returns for follow-up visit unaccompanied.  He was previously seen by Dr. Rosemarie 01/31/2023 for hospital stroke follow-up in 09/2022 (although HPI not updated from 07/2022 visit) after presenting with left hemiplegia, dysarthria and facial droop, admitted for right brain TIA aborted by TNK and later that day had second stroke with left MCA and right cerebellar infarcts in setting of M2 occlusion s/p IR, complicated by left frontal parietal ICH and SAH.  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 Hospital course was also complicated by MSSA bacteremia, EP was consulted but did not recommend ICD removal. Patient also had a PEG tube placed. Eventually ICH appeared to be stable on repeat CT scan, TEE was negative, antibiotics were stopped and Eliquis  was started on 7/17.  It was recommended to follow-up with cardiothoracic surgery for consideration of repeat repair if surgical candidate.  He was ultimately discharged to SNF.  Currently, he continues to reside at [facility].  Reports overall doing well since prior visit.  Continues to work with PT, feels like side is gradually getting stronger.  Ambulates short distance with PT but otherwise transfers via wheelchair.  He continues to struggle with his speech, denies currently working  with SLP.  He did have PEG tube removed on 4/17, reports maintaining regular diet and swallowing pills without difficulty.  No new stroke/TIA symptoms.  Per SNF MAR, remains on Eliquis  and atorvastatin .  Routinely followed by SNF [place] for stroke risk factor management.  Recent visit with cardiology 04/2023 and recommended 1 year follow-up.  No questions or concerns at this time.  Initial visit 08/06/2022 Dr. Rosemarie: [name] is a pleasant 64 year old African-American male seen today for initial office follow-up visit following hospital consultation for stroke in January 2024.  History is obtained from the patient and review of electronic medical records.  I personally reviewed pertinent available imaging films in PACS.  He has past medical history of bilateral cerebellar strokes, coronary artery disease, status post MI and stent, hyperlipidemia, smoking and using marijuana.  He presented on 05/04/2022 with sudden onset of nausea, disequilibrium and right hand incoordination he was unable to play the button on his phone with his right hand.  Symptoms resolved by the time of this imaging alone.  NIH stroke scale was 0.  CT head was unremarkable and CT angiogram showed no large vessel stenosis or occlusion.  MRI scan showed right inferior cerebellar small infarct.  2D echo showed ejection fraction of 35 to 40% with inferior lateral appendixes with mild LVH and grade 2 diastolic dysfunction.  Positive right to left shunting was noted with bubble injection but interatrial septum was not visualized.  LDL cholesterol 62 mg percent.  Hemoglobin A1c was 6.0.  Patient was counseled to quit smoking cigarettes and marijuana and switch from aspirin  and  Plavix  to aspirin  and Brilinta  for 4 weeks followed by aspirin  Plavix  due to affordability of Brilinta  long-term..  Patient states is done well since discharge.  He is right-handed coordination has improved.  He is not had any dizzy spells.  He has cut back smoking significantly  and smokes about half pack per day.  He also uses marijuana very occasionally.  He had a 30-day heart monitor as an outpatient which showed no evidence of fibrillation.  Patient has never been evaluated for sleep apnea but appears to be at risk.  He is tolerating Lipitor  well without muscle aches and pains.  His blood pressure is well-controlled today it is 110/75.  He remains on aspirin  and Plavix  and is only minor bruising and no bleeding.  ROS:   14 system review of systems is positive for those listed in HPI and all other systems negative  PMH:  Past Medical History:  Diagnosis Date   Abnormal EKG    hx of ischemia showing up on ekg's   Acute myopericarditis    a. 03/2013: readm with hypoxia, tachycardia, elevated ESR/CRP, elevated troponin with Dressler syndrome and myopericarditis; treated with steroids.   Acute respiratory failure (HCC)    a. 01/2013: VDRF. b. 03/2013: hypoxia requiring supp O2 during adm, resolved by discharge.   C. difficile colitis    a. 01/2013 during prolonged adm. b. Recurred 03/2013.   Cardiac tamponade    a. 01/2013 s/p drain.   Cataracts, bilateral    Coronary artery disease    a. s/p MI in 2009 in [place] with stenting of the LCX and LAD;  b. 12/2012 NSTEMI/CAD: LM nl, LAD patent prox stent, LCX 50-70 isr (FFR 0.84), RCA dom, 135m, EF 40-45%-->Med Rx. c. 01/2013: anterolateral STEMI complicated by pericardial effusion (presumed purulent pericarditis) and tamponade s/p drain, ruptured LV pseudoaneurysm s/p CorMatrix patch, CABGx1 (SVG-OM1), fever, CVA, VDRF, C diff.   CVA (cerebral infarction)    a. 01/2013 in setting of prolonged hospitalization - residual L arm weakness.   Dressler syndrome (HCC)    a. 03/2013: readm with hypoxia, tachycardia, elevated ESR/CRP, elevated troponin with Dressler syndrome and myopericarditis; treated with steroids.   Glaucoma    Hyperlipidemia    Hypokalemia    Hyponatremia    a. During late 2014.   Ischemic cardiomyopathy     a. Sept/Oct 2014: EF ~40% (ICM). b. 03/2013: EF 25-30%. Off ACEI due to hypotension (MIXED NICM/ICM).   Optic neuropathy, ischemic    Pericardial effusion    a. 01/2013: pericardial effusion (presumed purulent pericarditis) and cardiac tamponade s/p drain. b. Persistent moderate pericardial effusion 03/2013.   Pre-diabetes    Pseudoaneurysm of left ventricle of heart    a. 01/2013:  Ruptured inferoposterior LV pseudoaneurysm s/p CorMatrix patch.   Sinus bradycardia    asymptomatic   Stroke [facility]) 2009   weak lt side-lt arm   Tobacco abuse    Wears dentures    top    Social History:  Social History   Socioeconomic History   Marital status: Single    Spouse name: Not on file   Number of children: 2   Years of education: 12th   Highest education level: Not on file  Occupational History   Occupation: N/A  Tobacco Use   Smoking status: Every Day    Current packs/day: 1.00    Average packs/day: 1 pack/day for 35.0 years (35.0 ttl pk-yrs)    Types: Cigarettes   Smokeless tobacco: Never  Vaping Use  Vaping status: Never Used  Substance and Sexual Activity   Alcohol use: No    Alcohol/week: 0.0 standard drinks of alcohol   Drug use: No   Sexual activity: Yes  Other Topics Concern   Not on file  Social History Narrative   Lives in [place] with wife.  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  He is now retired on disability.  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      Caffeine Use: very rarely   Social Drivers of Health   Tobacco Use: High Risk (08/14/2023)   Patient History    Smoking Tobacco Use: Every Day    Smokeless Tobacco Use: Never    Passive Exposure: Not on file  Financial Resource Strain: Not on file  Food Insecurity: Patient Unable To Answer (10/11/2022)   Hunger Vital Sign    Worried About Running Out of Food in the Last Year: Patient unable to answer    Ran Out of Food in the Last Year: Patient unable to answer  Transportation Needs: Patient  Unable To Answer (10/11/2022)   PRAPARE - Transportation    Lack of Transportation (Medical): Patient unable to answer    Lack of Transportation (Non-Medical): Patient unable to answer  Physical Activity: Not on file  Stress: Not on file  Social Connections: Unknown (08/28/2021)   [facility] from [facility]   Social Network    Social Network: Not on file  Intimate Partner Violence: Patient Unable To Answer (10/11/2022)   Humiliation, Afraid, Rape, and Kick questionnaire    Fear of Current or Ex-Partner: Patient unable to answer    Emotionally Abused: Patient unable to answer    Physically Abused: Patient unable to answer    Sexually Abused: Patient unable to answer  Depression (PHQ2-9): Low Risk (05/08/2022)   Depression (PHQ2-9)    PHQ-2 Score: 0  Alcohol Screen: Not on file  Housing: Patient Unable To Answer (10/11/2022)   Housing    Last Housing Risk Score: 97  Utilities: Patient Unable To Answer (10/11/2022)   [facility] Utilities    Threatened with loss of utilities: Patient unable to answer  Health Literacy: Not on file    Medications:   Current Outpatient Medications on File Prior to Visit  Medication Sig Dispense Refill   ALPHAGAN  P 0.1 % SOLN INSTILL 1 DROP INTO BOTH EYES TWICE A DAY (Patient taking differently: Place 1 drop into both eyes in the morning and at bedtime.) 10 mL 0   apixaban  (ELIQUIS ) 5 MG TABS tablet Take 1 tablet (5 mg total) by mouth 2 (two) times daily.     atorvastatin  (LIPITOR ) 80 MG tablet Take 1 tablet (80 mg total) by mouth daily. 90 tablet 3   carvedilol  (COREG ) 3.125 MG tablet Take 1 tablet (3.125 mg total) by mouth 2 (two) times daily with a meal.     Cholecalciferol  (VITAMIN D3) 50 MCG (2000 UT) TABS Take 1 tablet by mouth daily.     cyclobenzaprine  (FLEXERIL ) 5 MG tablet Take 1 tablet (5 mg total) by mouth 3 (three) times daily as needed for muscle spasms. 15 tablet 0   dapagliflozin  propanediol (FARXIGA ) 10 MG TABS tablet Take 1 tablet (10 mg total) by  mouth daily. 90 tablet 3   docusate sodium  (COLACE) 100 MG capsule Take 100 mg by mouth 2 (two) times daily.     famotidine  (PEPCID ) 20 MG tablet Take 1 tablet (20 mg total) by mouth 2 (two) times daily.  fiber supplement, BANATROL TF, liquid Place 60 mLs into feeding tube 2 (two) times daily.     FLUoxetine  (PROZAC ) 10 MG capsule Take 1 capsule (10 mg total) by mouth daily.     fluticasone  (FLONASE ) 50 MCG/ACT nasal spray Place 1 spray into both nostrils daily. 16 g 2   gabapentin  (NEURONTIN ) 250 MG/5ML solution Take 6 mLs (300 mg total) by mouth at bedtime.     latanoprost  (XALATAN ) 0.005 % ophthalmic solution Place 1 drop into both eyes at bedtime.  12   Multiple Vitamins-Minerals (CENTRUM MEN) TABS Take 1 tablet by mouth daily.     nitroGLYCERIN  (NITROSTAT ) 0.4 MG SL tablet Place 1 tablet (0.4 mg total) under the tongue every 5 (five) minutes x 3 doses as needed for chest pain. 25 tablet 1   ondansetron  (ZOFRAN ) 4 MG tablet Take 4 mg by mouth every 8 (eight) hours as needed for nausea or vomiting.     polyethylene glycol (MIRALAX  / GLYCOLAX ) 17 g packet Take 17 g by mouth 2 (two) times daily as needed.     saccharomyces boulardii (FLORASTOR) 250 MG capsule Take 250 mg by mouth 2 (two) times daily.     sacubitril -valsartan  (ENTRESTO ) 24-26 MG Take 1 tablet by mouth 2 (two) times daily.     spironolactone  (ALDACTONE ) 25 MG tablet Take 0.5 tablets (12.5 mg total) by mouth daily. 45 tablet 1   No current facility-administered medications on file prior to visit.    Allergies:  No Known Allergies  Physical Exam There were no vitals filed for this visit.  There is no height or weight on file to calculate BMI.  General: Mildly obese very pleasant middle-aged African-American male, seated, in no evident distress  Neurologic Exam Mental Status: Awake and fully alert.  Moderate to severe dysarthria, able to understand short simple sentences with some difficulty.  Oriented to place and time.  Recent and remote memory intact. Attention span, concentration and fund of knowledge appropriate. Mood and affect appropriate.  Cranial Nerves: Pupils equal, briskly reactive to light. Extraocular movements full without nystagmus.  Deficits in bilateral lower visual fields.  Hearing intact. Facial sensation intact.  Slight right lower facial weakness.  Tongue, palate moves normally and symmetrically.  Motor: Normal strength, bulk and tone left upper and lower extremity.  RUE: 4/5 with decreased grip strength and hand dexterity, increased tone in hand. RLE: 4/5 throughout Sensory.:  Decreased sensation RUE>RLE compared to left side Coordination: Rapid alternating movements normal on left side. Finger-to-nose and heel-to-shin performed accurately on left side. Gait and Station: Deferred Reflexes: 1+ and symmetric. Toes downgoing.         ASSESSMENT: 64 year old African-American male with right brain TIA s/p TNK and subsequent left MCA and right cerebellar infarcts s/p IR in 09/2022 complicated by left frontoparietal ICH and SAH with evidence of LV thrombus over patch repair from prior LV pseudoaneurysm rupture in 2014.  History of right cerebellar infarct and January 2024 of cryptogenic etiology.  Evidence of PFO but low rope score and poor functional status therefore did not recommend pursuing PFO closure.  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     PLAN:  1.  Right cerebellar stroke  -Residual deficits: Mild right sided weakness, sensory impairment, dysarthria and gait impairment. Continue working with PT at [facility],  consider restart of SLP for persistent speech difficulties.  S/p PEG removal 07/2023, tolerating regular diet without difficulty - Continue  Eliquis  5 mg twice daily for secondary stroke prevention and for LV thrombus managed/prescribed by cardiology  - Continue atorvastatin  80 mg daily for secondary stroke prevention managed/prescribed by PCP  - Continue close PCP follow-up for aggressive stroke risk factor management  2.  Large inferior cardiac pseudoaneurysm 3.  LV thrombus  - Per cardiology, recommend lifelong anticoagulation for stroke prevention  -prior cardiology follow-up 04/2023, recommended 70-month follow-up with Dr. Inocencio     Follow-up in 9 months or call earlier if needed         Harlene Bogaert, [facility]  [facility] [address] [place], [place] [address]  Phone [phone] Fax [phone] Note: This document was prepared with digital dictation and possible smart phrase technology. Any transcriptional errors that result from this process are unintentional.   "

## 2024-05-13 ENCOUNTER — Telehealth: Payer: Self-pay | Admitting: Adult Health

## 2024-05-13 ENCOUNTER — Ambulatory Visit: Admitting: Adult Health

## 2024-05-13 ENCOUNTER — Encounter: Payer: Self-pay | Admitting: Adult Health

## 2024-05-13 VITALS — BP 92/66 | HR 68

## 2024-05-13 DIAGNOSIS — G8191 Hemiplegia, unspecified affecting right dominant side: Secondary | ICD-10-CM

## 2024-05-13 DIAGNOSIS — I63412 Cerebral infarction due to embolism of left middle cerebral artery: Secondary | ICD-10-CM | POA: Diagnosis not present

## 2024-05-13 DIAGNOSIS — I639 Cerebral infarction, unspecified: Secondary | ICD-10-CM | POA: Diagnosis not present

## 2024-05-13 DIAGNOSIS — I513 Intracardiac thrombosis, not elsewhere classified: Secondary | ICD-10-CM | POA: Diagnosis not present

## 2024-05-13 DIAGNOSIS — I69322 Dysarthria following cerebral infarction: Secondary | ICD-10-CM

## 2024-05-13 NOTE — Addendum Note (Signed)
 Addended by: WHITFIELD RAISIN L on: 05/13/2024 03:15 PM   Modules accepted: Orders

## 2024-05-13 NOTE — Telephone Encounter (Signed)
 Referral for speech therapy fax to [facility]. Phone: [phone], Fax: [phone]

## 2024-05-13 NOTE — Patient Instructions (Signed)
 Your Plan:  Continue current treatment regimen/medications for stroke prevention measures  Complete routine labs including CBC, CMP and lipid panel - patient wishes to have these completed at [facility]  Request restart of PT for right sided deficits and gait impairment and restart speech therapy for speech/language impairment   SNF will continue to monitor stroke risk factors and ongoing management      No further recommendations from stroke standpoint at this time. SNF can continue to monitor you closely. You can follow up here as needed but please call with any questions or concerns at this time       Thank you for coming to see us  at [facility]. I hope we have been able to provide you high quality care today.  You may receive a patient satisfaction survey over the next few weeks. We would appreciate your feedback and comments so that we may continue to improve ourselves and the health of our patients.

## 2024-05-13 NOTE — Progress Notes (Signed)
 Called and spoke with Referrals for [facility], she said it would be an outpatient order that he is currently in a rehabilitation house and has physical therapy.

## 2024-05-13 NOTE — Telephone Encounter (Signed)
 Referral for physical therapy fax to [facility]. Phone: [phone], Fax: [phone]

## 2024-05-13 NOTE — Progress Notes (Signed)
 Diane in referrals left at 2pm, will call back tomorrow (05/14/2024) and try to grab her to verify if their provider can order.

## 2024-05-20 ENCOUNTER — Ambulatory Visit

## 2024-05-22 ENCOUNTER — Telehealth: Payer: Self-pay | Admitting: Cardiology

## 2024-05-22 NOTE — Telephone Encounter (Signed)
 Patient was called regarding missed remote transmission. #[phone] not in service, #[id number] rehab, got hung up on

## 2024-08-19 ENCOUNTER — Ambulatory Visit

## 2024-11-18 ENCOUNTER — Ambulatory Visit

## 2025-02-17 ENCOUNTER — Ambulatory Visit

## 2025-05-19 ENCOUNTER — Ambulatory Visit
